# Patient Record
Sex: Female | Born: 1950 | Race: Black or African American | Hispanic: No | Smoking: Never smoker
Health system: Southern US, Community
[De-identification: ages and names within clinical notes are randomized; demographics above are authoritative.]

## PROBLEM LIST (undated history)

## (undated) DIAGNOSIS — M1990 Unspecified osteoarthritis, unspecified site: Secondary | ICD-10-CM

## (undated) DIAGNOSIS — R001 Bradycardia, unspecified: Secondary | ICD-10-CM

## (undated) DIAGNOSIS — Z8619 Personal history of other infectious and parasitic diseases: Secondary | ICD-10-CM

## (undated) DIAGNOSIS — N183 Chronic kidney disease, stage 3 unspecified: Secondary | ICD-10-CM

## (undated) DIAGNOSIS — N189 Chronic kidney disease, unspecified: Secondary | ICD-10-CM

## (undated) DIAGNOSIS — G4700 Insomnia, unspecified: Secondary | ICD-10-CM

## (undated) DIAGNOSIS — E785 Hyperlipidemia, unspecified: Secondary | ICD-10-CM

## (undated) DIAGNOSIS — M545 Low back pain, unspecified: Secondary | ICD-10-CM

## (undated) DIAGNOSIS — G8929 Other chronic pain: Secondary | ICD-10-CM

## (undated) DIAGNOSIS — Z9889 Other specified postprocedural states: Secondary | ICD-10-CM

## (undated) DIAGNOSIS — Z95810 Presence of automatic (implantable) cardiac defibrillator: Secondary | ICD-10-CM

## (undated) DIAGNOSIS — F329 Major depressive disorder, single episode, unspecified: Secondary | ICD-10-CM

## (undated) DIAGNOSIS — Z9981 Dependence on supplemental oxygen: Secondary | ICD-10-CM

## (undated) DIAGNOSIS — E1142 Type 2 diabetes mellitus with diabetic polyneuropathy: Secondary | ICD-10-CM

## (undated) DIAGNOSIS — J189 Pneumonia, unspecified organism: Secondary | ICD-10-CM

## (undated) DIAGNOSIS — T783XXA Angioneurotic edema, initial encounter: Secondary | ICD-10-CM

## (undated) DIAGNOSIS — E119 Type 2 diabetes mellitus without complications: Secondary | ICD-10-CM

## (undated) DIAGNOSIS — I10 Essential (primary) hypertension: Secondary | ICD-10-CM

## (undated) DIAGNOSIS — K219 Gastro-esophageal reflux disease without esophagitis: Secondary | ICD-10-CM

## (undated) DIAGNOSIS — R55 Syncope and collapse: Secondary | ICD-10-CM

## (undated) DIAGNOSIS — I255 Ischemic cardiomyopathy: Secondary | ICD-10-CM

## (undated) DIAGNOSIS — I2510 Atherosclerotic heart disease of native coronary artery without angina pectoris: Secondary | ICD-10-CM

## (undated) DIAGNOSIS — E039 Hypothyroidism, unspecified: Secondary | ICD-10-CM

## (undated) DIAGNOSIS — I38 Endocarditis, valve unspecified: Secondary | ICD-10-CM

## (undated) DIAGNOSIS — Z950 Presence of cardiac pacemaker: Secondary | ICD-10-CM

## (undated) DIAGNOSIS — E669 Obesity, unspecified: Secondary | ICD-10-CM

## (undated) DIAGNOSIS — IMO0002 Reserved for concepts with insufficient information to code with codable children: Secondary | ICD-10-CM

## (undated) DIAGNOSIS — G4733 Obstructive sleep apnea (adult) (pediatric): Secondary | ICD-10-CM

## (undated) DIAGNOSIS — M109 Gout, unspecified: Secondary | ICD-10-CM

## (undated) DIAGNOSIS — E559 Vitamin D deficiency, unspecified: Secondary | ICD-10-CM

## (undated) DIAGNOSIS — R0609 Other forms of dyspnea: Secondary | ICD-10-CM

## (undated) DIAGNOSIS — J45902 Unspecified asthma with status asthmaticus: Secondary | ICD-10-CM

## (undated) DIAGNOSIS — I509 Heart failure, unspecified: Secondary | ICD-10-CM

## (undated) DIAGNOSIS — Z9289 Personal history of other medical treatment: Secondary | ICD-10-CM

## (undated) DIAGNOSIS — I428 Other cardiomyopathies: Secondary | ICD-10-CM

## (undated) DIAGNOSIS — I447 Left bundle-branch block, unspecified: Secondary | ICD-10-CM

## (undated) DIAGNOSIS — I219 Acute myocardial infarction, unspecified: Secondary | ICD-10-CM

## (undated) DIAGNOSIS — R0600 Dyspnea, unspecified: Secondary | ICD-10-CM

## (undated) DIAGNOSIS — R42 Dizziness and giddiness: Secondary | ICD-10-CM

## (undated) DIAGNOSIS — Z9989 Dependence on other enabling machines and devices: Secondary | ICD-10-CM

## (undated) DIAGNOSIS — R609 Edema, unspecified: Secondary | ICD-10-CM

## (undated) DIAGNOSIS — D509 Iron deficiency anemia, unspecified: Secondary | ICD-10-CM

## (undated) DIAGNOSIS — R112 Nausea with vomiting, unspecified: Secondary | ICD-10-CM

## (undated) DIAGNOSIS — Z8489 Family history of other specified conditions: Secondary | ICD-10-CM

## (undated) DIAGNOSIS — F32A Depression, unspecified: Secondary | ICD-10-CM

## (undated) DIAGNOSIS — F419 Anxiety disorder, unspecified: Secondary | ICD-10-CM

## (undated) HISTORY — DX: Depression, unspecified: F32.A

## (undated) HISTORY — DX: Syncope and collapse: R55

## (undated) HISTORY — DX: Other cardiomyopathies: I42.8

## (undated) HISTORY — DX: Insomnia, unspecified: G47.00

## (undated) HISTORY — DX: Other chronic pain: G89.29

## (undated) HISTORY — PX: TRIGGER FINGER RELEASE: SHX641

## (undated) HISTORY — DX: Essential (primary) hypertension: I10

## (undated) HISTORY — DX: Dyspnea, unspecified: R06.00

## (undated) HISTORY — DX: Bradycardia, unspecified: R00.1

## (undated) HISTORY — PX: VESICOVAGINAL FISTULA CLOSURE W/ TAH: SUR271

## (undated) HISTORY — DX: Chronic kidney disease, unspecified: N18.9

## (undated) HISTORY — DX: Endocarditis, valve unspecified: I38

## (undated) HISTORY — DX: Edema, unspecified: R60.9

## (undated) HISTORY — PX: CATARACT EXTRACTION W/ INTRAOCULAR LENS  IMPLANT, BILATERAL: SHX1307

## (undated) HISTORY — DX: Dizziness and giddiness: R42

## (undated) HISTORY — PX: TUBAL LIGATION: SHX77

## (undated) HISTORY — DX: Major depressive disorder, single episode, unspecified: F32.9

## (undated) HISTORY — DX: Hypothyroidism, unspecified: E03.9

## (undated) HISTORY — DX: Reserved for concepts with insufficient information to code with codable children: IMO0002

## (undated) HISTORY — DX: Heart failure, unspecified: I50.9

## (undated) HISTORY — DX: Iron deficiency anemia, unspecified: D50.9

## (undated) HISTORY — DX: Angioneurotic edema, initial encounter: T78.3XXA

## (undated) HISTORY — DX: Personal history of other infectious and parasitic diseases: Z86.19

## (undated) HISTORY — DX: Other forms of dyspnea: R06.09

## (undated) HISTORY — DX: Unspecified osteoarthritis, unspecified site: M19.90

## (undated) HISTORY — DX: Left bundle-branch block, unspecified: I44.7

## (undated) HISTORY — DX: Gout, unspecified: M10.9

## (undated) HISTORY — DX: Hyperlipidemia, unspecified: E78.5

## (undated) HISTORY — DX: Vitamin D deficiency, unspecified: E55.9

## (undated) HISTORY — PX: DILATION AND CURETTAGE OF UTERUS: SHX78

## (undated) HISTORY — DX: Ischemic cardiomyopathy: I25.5

## (undated) HISTORY — DX: Type 2 diabetes mellitus without complications: E11.9

## (undated) HISTORY — DX: Obesity, unspecified: E66.9

## (undated) HISTORY — PX: COLONOSCOPY: SHX174

## (undated) HISTORY — DX: Unspecified asthma with status asthmaticus: J45.902

---

## 1988-07-25 HISTORY — PX: BLADDER SUSPENSION: SHX72

## 1988-07-25 HISTORY — PX: TOTAL ABDOMINAL HYSTERECTOMY: SHX209

## 2003-07-26 HISTORY — PX: CARDIAC CATHETERIZATION: SHX172

## 2003-07-26 HISTORY — PX: CARDIAC DEFIBRILLATOR PLACEMENT: SHX171

## 2006-07-25 HISTORY — PX: IMPLANTABLE CARDIOVERTER DEFIBRILLATOR GENERATOR CHANGE: SHX5859

## 2008-08-06 DIAGNOSIS — Z95810 Presence of automatic (implantable) cardiac defibrillator: Secondary | ICD-10-CM | POA: Insufficient documentation

## 2012-10-31 DIAGNOSIS — Z87898 Personal history of other specified conditions: Secondary | ICD-10-CM | POA: Insufficient documentation

## 2013-05-25 HISTORY — PX: CYST REMOVAL HAND: SHX6279

## 2013-08-15 DIAGNOSIS — R579 Shock, unspecified: Secondary | ICD-10-CM | POA: Insufficient documentation

## 2013-08-15 DIAGNOSIS — I2510 Atherosclerotic heart disease of native coronary artery without angina pectoris: Secondary | ICD-10-CM | POA: Insufficient documentation

## 2014-01-17 ENCOUNTER — Encounter: Payer: Self-pay | Admitting: Internal Medicine

## 2014-01-30 ENCOUNTER — Encounter: Payer: Self-pay | Admitting: Internal Medicine

## 2014-01-30 ENCOUNTER — Ambulatory Visit (INDEPENDENT_AMBULATORY_CARE_PROVIDER_SITE_OTHER): Payer: BC Managed Care – PPO | Admitting: Internal Medicine

## 2014-01-30 ENCOUNTER — Ambulatory Visit
Admission: RE | Admit: 2014-01-30 | Discharge: 2014-01-30 | Disposition: A | Discharge status: Home / Self Care | Payer: BC Managed Care – PPO | Source: Ambulatory Visit | Attending: Internal Medicine | Admitting: Internal Medicine

## 2014-01-30 VITALS — BP 134/77 | HR 83 | Ht 65.0 in | Wt 201.1 lb

## 2014-01-30 DIAGNOSIS — I428 Other cardiomyopathies: Secondary | ICD-10-CM

## 2014-01-30 DIAGNOSIS — N183 Chronic kidney disease, stage 3 unspecified: Secondary | ICD-10-CM

## 2014-01-30 DIAGNOSIS — I519 Heart disease, unspecified: Secondary | ICD-10-CM

## 2014-01-30 DIAGNOSIS — Z950 Presence of cardiac pacemaker: Secondary | ICD-10-CM

## 2014-01-30 DIAGNOSIS — I10 Essential (primary) hypertension: Secondary | ICD-10-CM

## 2014-01-30 DIAGNOSIS — I447 Left bundle-branch block, unspecified: Secondary | ICD-10-CM

## 2014-01-30 MED ORDER — SPIRONOLACTONE 25 MG PO TABS: 12.5000 mg | ORAL_TABLET | Freq: Every day | ORAL | Status: DC

## 2014-01-30 NOTE — Progress Notes (Signed)
Primary Care Physician: Teofilo Pod., PA-C Referring Physician:  Self referred Primary Cardiologist:  She hasn't seen anyone since 9/14   Crystal [name] is a 63 y.o. female with a h/o nonischemic CM, LBBB, chronic renal insufficiency, and diabetes who presents for Ep evaluation.  She reports that she was initially diagnosed with CHF in 2005.  She underwent cath at that time which revealed no significant CAD.  She says that she has not required coronary revascularization.  She was initiated on medical therapy but had persistently reduced EF (24% by notes).  She had a Guideant BiV ICD implanted in 2005 by Dr Modena Jansky.  LV lead could not be paced and she therefore had an epicardial LV lead placed 2 weeks later.  She had a generator change in 2008.  She has had progressive elevation in her RV lead threshold since that time.  Her EF improved to > 55% with BiV pacing.  She eventually reached ERI.  Given normalization of her EF and her RV threshold rise, it was decided to not replace the device and to follow with watchful waiting when she was seen in consultation by an EP physician on [facility]. She reports slowly progressing difficulty with SOB and abdominal fluid retention over the past few months. She has mild orthopnea and BLE edema.  She has fatigue which has been worse since starting verapamil (for blood pressure control). She developed acute renal failure and was hospitalized at [facility] several months ago.  She has made very slow progress since that time.  Most recently she was seen by Dr Neva Seat at [facility] 01/20/14.  Echo revealed EF 45-50%.  She was referred to EP at [facility] but decided to come see me today instead.  She states that she would prefer to have her care through our practice.  Today, she denies symptoms of palpitations, chest pain,  dizziness, presyncope, syncope, or neurologic sequela. The patient is tolerating medications without difficulties and is  otherwise without complaint today.   Past Medical History  Diagnosis Date  . Chronic renal insufficiency   . Anemia, iron deficiency   . Angioedema     felt to likely be due to ace inhibitors but says she has had this even off of medicines, appears to be tolerating ARBs chronically  . Nonischemic cardiomyopathy     s/p ICD in 2005. EF has since recovered  . CHF (congestive heart failure)   . Diabetes mellitus, type 2   . Gout   . Hyperlipidemia   . Obesity   . Systemic hypertension   . Unspecified hypothyroidism   . Intervertebral disc degeneration   . Back pain   . Hypertension   . Automatic implantable cardiac defibrillator in situ     high RV threshold chronically, device was turned off in 2014  . LBBB (left bundle branch block)    Past Surgical History  Procedure Laterality Date  . Vesicovaginal fistula closure w/ tah    . Cardiac catheterization  2005    no obstructive CAD per patient  . Cyst removal hand Right 05/2013    thumb  . Trigger finger release  05/213  . Cardiac defibrillator placement  2005    BiV ICD implanted,  LV lead is an epicardial lead  . Implantable cardioverter defibrillator generator change  2008  . Total abdominal hysterectomy      Current Outpatient Prescriptions  Medication Sig Dispense Refill  . ALPRAZolam (XANAX) 0.5 MG tablet Take 0.5  mg by mouth at bedtime.      Marland Kitchen aspirin 81 MG tablet Take 81 mg by mouth daily.      . carvedilol (COREG) 25 MG tablet Take 25 mg by mouth 2 (two) times daily with a meal.      . citalopram (CELEXA) 20 MG tablet Take 20 mg by mouth daily.      Marland Kitchen HYDROcodone-acetaminophen (NORCO/VICODIN) 5-325 MG per tablet Take 1 tablet by mouth every 6 (six) hours as needed for moderate pain.      . Insulin Glargine (TOUJEO SOLOSTAR [place]) Inject into the skin as directed.      . insulin glulisine (APIDRA) 100 UNIT/ML injection Inject into the skin as directed.      Marland Kitchen levothyroxine (SYNTHROID, LEVOTHROID) 88 MCG tablet Take 88  mcg by mouth daily before breakfast.      . linagliptin (TRADJENTA) 5 MG TABS tablet Take 5 mg by mouth daily.      Marland Kitchen loratadine (CLARITIN) 10 MG tablet Take 10 mg by mouth daily.      . metolazone (ZAROXOLYN) 2.5 MG tablet Take 2.5 mg by mouth as directed.      . montelukast (SINGULAIR) 10 MG tablet Take 10 mg by mouth at bedtime.      . temazepam (RESTORIL) 15 MG capsule Take 15 mg by mouth at bedtime.      Marland Kitchen ULORIC 40 MG tablet Take 1 tablet by mouth daily.      . valsartan (DIOVAN) 160 MG tablet Take 160 mg by mouth daily.      . Vitamin D, Ergocalciferol, (DRISDOL) 50000 UNITS CAPS capsule Take 50,000 Units by mouth every 30 (thirty) days.        No current facility-administered medications for this visit.    Allergies  Allergen Reactions  . Glucophage [Metformin Hcl] Other (See Comments)    Renal failure  . Ace Inhibitors Swelling and Other (See Comments)    Angioedema  . Advicor [Niacin-Lovastatin Er] Other (See Comments)    Muscle aches  . Bystolic [Nebivolol Hcl] Other (See Comments)    Edema  . Erythromycin Diarrhea and Nausea And Vomiting    *DERIVATIVES*  . Lipitor [Atorvastatin] Other (See Comments)    Muscle aches  . Lopid [Gemfibrozil] Other (See Comments)    Muscle aches  . Statins     Muscle aches    History   Social History  . Marital Status: Widowed    Spouse Name: N/A    Number of Children: N/A  . Years of Education: N/A   Occupational History  . Not on file.   Social History Main Topics  . Smoking status: Never Smoker   . Smokeless tobacco: Not on file  . Alcohol Use: No  . Drug Use: No  . Sexual Activity: Not on file   Other Topics Concern  . Not on file   Social History Narrative   Pt lives in [place] alone.  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    Family History  Problem Relation Age of Onset  . Arrhythmia Mother   . Diabetes Mellitus II Mother     Borderline DM  . Hypertension Mother     . Hypertension Father   . Heart disease Father   . Cancer Father     prostate  . Heart disease Other     (Maternal side) Ischemic heart disease  . Diabetes Mellitus II Other  ROS- All systems are reviewed and negative except as per the HPI above  Physical Exam: Filed Vitals:   01/30/14 0835  BP: 134/77  Pulse: 83  Height: 5\' 5"  (1.651 m)  Weight: 201 lb 1.9 oz (91.227 kg)    GEN- The patient is pleasant but overweight appearing, alert and oriented x 3 today.   Head- normocephalic, atraumatic Eyes-  Sclera clear, conjunctiva pink Ears- hearing intact Oropharynx- clear Neck- supple,  Lungs- Clear to ausculation bilaterally, normal work of breathing Heart- Regular rate and rhythm, no murmurs, rubs or gallops, PMI not laterally displaced GI- soft, NT, ND, + BS Extremities- no clubbing, cyanosis, +1 edema MS- no significant deformity or atrophy Skin- no rash or lesion Psych- euthymic mood, full affect Neuro- strength and sensation are intact  EKG today reveals sinus rhythm with LBBB (QRS 152 msec) CXR today is reviewed and reveals stable RA/RV leads as well as an epicardial LV lead Over 40 pages of records are reviewed today  Assessment and Plan:  1. Nonischemic CM, LBBB, NYHA Class II/III CHF Ms Eatmon presents today for third opinion regarding her CRT-D device which is at [facility].  She has responded well to CRT but subsequently had RV threshold rise with failure of her RV lead.  She initially opted to not replace the lead or consider generator change.  Her device has been programmed off for quite some time.  She did well initially with this, but appears to be having progressive decline.  Her EF has decreased upon most recent evaluation. I had a long discussion with the patient today about options.  I think that the best strategy is RV lead replacement and new generator placement.  Risks, benefits, and alternatives to this approach were discussed.  She is clear at this time  that she does not wish to undergo the procedure but would prefer medical management. I will therefore start spironolactone 12.5mg  daily today. I will also stop verapamil which seems to have caused worsening symptoms.  I will refer her to Dr Harl Bowie for aggressive medicine titration and follow-up.  We should repeat her echo in 3 months with optimization of her medicine.  IF she continues to have progressive decline in EF or fails to improve then she may be more willing to consider BiV system revision at that time.

## 2014-01-30 NOTE — Patient Instructions (Signed)
Your physician has recommended you make the following change in your medication:  1. Stop verapamil 2. Start spironolactone 12.5 mg once daily (1/2 tablet)  A chest x-ray takes a picture of the organs and structures inside the chest, including the heart, lungs, and blood vessels. This test can show several things, including, whether the heart is enlarges; whether fluid is building up in the lungs; and whether pacemaker / defibrillator leads are still in place.  Your physician has requested that you have an echocardiogram. Echocardiography is a painless test that uses sound waves to create images of your heart. It provides your doctor with information about the size and shape of your heart and how well your heart's chambers and valves are working. This procedure takes approximately one hour. There are no restrictions for this procedure. PLEASE HAVE YOUR ECHOCARDIOGRAM IN 3 MONTHS.  Your physician wants you to follow-up in: 3 months with Dr. Rayann Heman. You will receive a reminder letter in the mail two months in advance. If you don't receive a letter, please call our office to schedule the follow-up appointment.  Make an appointment to establish care with Dr. Harl Bowie (cardiology) in [place], [place]

## 2014-02-02 DIAGNOSIS — I519 Heart disease, unspecified: Secondary | ICD-10-CM | POA: Insufficient documentation

## 2014-02-02 DIAGNOSIS — I10 Essential (primary) hypertension: Secondary | ICD-10-CM | POA: Insufficient documentation

## 2014-02-02 DIAGNOSIS — N189 Chronic kidney disease, unspecified: Secondary | ICD-10-CM | POA: Insufficient documentation

## 2014-02-02 DIAGNOSIS — I428 Other cardiomyopathies: Secondary | ICD-10-CM | POA: Insufficient documentation

## 2014-02-02 DIAGNOSIS — I447 Left bundle-branch block, unspecified: Secondary | ICD-10-CM | POA: Insufficient documentation

## 2014-02-05 ENCOUNTER — Institutional Professional Consult (permissible substitution): Payer: Self-pay | Admitting: Internal Medicine

## 2014-02-10 ENCOUNTER — Ambulatory Visit (INDEPENDENT_AMBULATORY_CARE_PROVIDER_SITE_OTHER): Payer: BC Managed Care – PPO | Admitting: Cardiology

## 2014-02-10 ENCOUNTER — Encounter: Payer: Self-pay | Admitting: Cardiology

## 2014-02-10 VITALS — BP 107/68 | HR 83 | Ht 65.0 in | Wt 196.0 lb

## 2014-02-10 DIAGNOSIS — N189 Chronic kidney disease, unspecified: Secondary | ICD-10-CM

## 2014-02-10 DIAGNOSIS — I5022 Chronic systolic (congestive) heart failure: Secondary | ICD-10-CM

## 2014-02-10 NOTE — Progress Notes (Signed)
Clinical Summary Crystal [name] is a 63 y.o.female last seen by Dr Rayann Heman, this is our first visit together. She has a long history of cardiomyopathy previously followed at [facility]. She recently saw Dr Rayann Heman as new EP patient, and is set up with [place] today for assistance in management with her chronic systolic heart failure   1. Chronic systolic heart failure - long hx of NICM dating back to 2005, with reported LVEF at that time 24%/  - cath at that time showed no significant CAD.  - She has a chronic LBBB. Had a BiV AICD placed previously with epicardial LV lead with subsequent normalization of LVEF - LVEF improved with BiV pacing to >55%. - from notes device reached ERI, her RV lead has increasing thresholds around that time. It was decided not to replace the device  since normal LVEF and follow her clinically.  - recently seen at [facility] with repeat echo 12/2013 LVEF 45-50%  - questionable hx of angioedema on ACE-Is, she states has been on ARBs and has tolerated.  - last visit started on aldactone 12.5mg  daily, verapamil was stopped. She has not had repeat labs done yet.   - reports significant weight gain since May, approx 20 lbs. Increased SOB, DOE at that time, but recently has been improving.  DOE at 1-3 blocks, which is somewhat better from the last few months - no orthopnea, no PND, no LE edema though fluid usually builds in abdomen. Home weights stable around 200 lbs.  - limiting salt intake, avoiding NSAIDs.  -  On lasix 20mg  daily, metolazone 2.5 once weekly.       Past Medical History  Diagnosis Date  . Chronic renal insufficiency   . Anemia, iron deficiency   . Angioedema     felt to likely be due to ace inhibitors but says she has had this even off of medicines, appears to be tolerating ARBs chronically  . Nonischemic cardiomyopathy     s/p ICD in 2005. EF has since recovered  . CHF (congestive heart failure)   . Diabetes mellitus, type 2   . Gout   .  Hyperlipidemia   . Obesity   . Systemic hypertension   . Unspecified hypothyroidism   . Intervertebral disc degeneration   . Back pain   . Hypertension   . Automatic implantable cardiac defibrillator in situ     high RV threshold chronically, device was turned off in 2014  . LBBB (left bundle [place] block)      Allergies  Allergen Reactions  . Glucophage [Metformin Hcl] Other (See Comments)    Renal failure  . Ace Inhibitors Swelling and Other (See Comments)    Angioedema  . Advicor [Niacin-Lovastatin Er] Other (See Comments)    Muscle aches  . Bystolic [Nebivolol Hcl] Other (See Comments)    Edema  . Erythromycin Diarrhea and Nausea And Vomiting    *DERIVATIVES*  . Lipitor [Atorvastatin] Other (See Comments)    Muscle aches  . Lopid [Gemfibrozil] Other (See Comments)    Muscle aches  . Statins     Muscle aches     Current Outpatient Prescriptions  Medication Sig Dispense Refill  . ALPRAZolam (XANAX) 0.5 MG tablet Take 0.5 mg by mouth at bedtime.      Marland Kitchen aspirin 81 MG tablet Take 81 mg by mouth daily.      . carvedilol (COREG) 25 MG tablet Take 25 mg by mouth 2 (two) times daily with a  meal.      . citalopram (CELEXA) 20 MG tablet Take 20 mg by mouth daily.      . furosemide (LASIX) 20 MG tablet Take 20 mg by mouth daily.      Marland Kitchen HYDROcodone-acetaminophen (NORCO/VICODIN) 5-325 MG per tablet Take 1 tablet by mouth every 6 (six) hours as needed for moderate pain.      . Insulin Glargine (TOUJEO SOLOSTAR [place]) Inject into the skin as directed.      . insulin glulisine (APIDRA) 100 UNIT/ML injection Inject into the skin as directed.      Marland Kitchen levothyroxine (SYNTHROID, LEVOTHROID) 88 MCG tablet Take 88 mcg by mouth daily before breakfast.      . linagliptin (TRADJENTA) 5 MG TABS tablet Take 5 mg by mouth daily.      Marland Kitchen loratadine (CLARITIN) 10 MG tablet Take 10 mg by mouth daily.      . metolazone (ZAROXOLYN) 2.5 MG tablet Take 2.5 mg by mouth as directed.      . montelukast  (SINGULAIR) 10 MG tablet Take 10 mg by mouth at bedtime.      Marland Kitchen spironolactone (ALDACTONE) 25 MG tablet Take 0.5 tablets (12.5 mg total) by mouth daily.  90 tablet  3  . temazepam (RESTORIL) 15 MG capsule Take 15 mg by mouth at bedtime.      Marland Kitchen ULORIC 40 MG tablet Take 1 tablet by mouth daily.      . valsartan (DIOVAN) 160 MG tablet Take 160 mg by mouth daily.      . Vitamin D, Ergocalciferol, (DRISDOL) 50000 UNITS CAPS capsule Take 50,000 Units by mouth every 30 (thirty) days.        No current facility-administered medications for this visit.     Past Surgical History  Procedure Laterality Date  . Vesicovaginal fistula closure w/ tah    . Cardiac catheterization  2005    no obstructive CAD per patient  . Cyst removal hand Right 05/2013    thumb  . Trigger finger release  05/213  . Cardiac defibrillator placement  2005    BiV ICD implanted,  LV lead is an epicardial lead  . Implantable cardioverter defibrillator generator change  2008  . Total abdominal hysterectomy       Allergies  Allergen Reactions  . Glucophage [Metformin Hcl] Other (See Comments)    Renal failure  . Ace Inhibitors Swelling and Other (See Comments)    Angioedema  . Advicor [Niacin-Lovastatin Er] Other (See Comments)    Muscle aches  . Bystolic [Nebivolol Hcl] Other (See Comments)    Edema  . Erythromycin Diarrhea and Nausea And Vomiting    *DERIVATIVES*  . Lipitor [Atorvastatin] Other (See Comments)    Muscle aches  . Lopid [Gemfibrozil] Other (See Comments)    Muscle aches  . Statins     Muscle aches      Family History  Problem Relation Age of Onset  . Arrhythmia Mother   . Diabetes Mellitus II Mother     Borderline DM  . Hypertension Mother   . Hypertension Father   . Heart disease Father   . Cancer Father     prostate  . Heart disease Other     (Maternal side) Ischemic heart disease  . Diabetes Mellitus II Other      Social History Ms. Falla reports that she has never smoked.  She does not have any smokeless tobacco history on file. Ms. Blot reports that she does not drink alcohol.  Review of Systems CONSTITUTIONAL: No weight loss, fever, chills, weakness or fatigue.  HEENT: Eyes: No visual loss, blurred vision, double vision or yellow sclerae.No hearing loss, sneezing, congestion, runny nose or sore throat.  SKIN: No rash or itching.  CARDIOVASCULAR: per HPI RESPIRATORY: No cough or sputum.  GASTROINTESTINAL: No anorexia, nausea, vomiting or diarrhea. No abdominal pain or blood.  GENITOURINARY: No burning on urination, no polyuria NEUROLOGICAL: No headache, dizziness, syncope, paralysis, ataxia, numbness or tingling in the extremities. No change in bowel or bladder control.  MUSCULOSKELETAL: No muscle, back pain, joint pain or stiffness.  LYMPHATICS: No enlarged nodes. No history of splenectomy.  PSYCHIATRIC: No history of depression or anxiety.  ENDOCRINOLOGIC: No reports of sweating, cold or heat intolerance. No polyuria or polydipsia.  Marland Kitchen   Physical Examination p 83 bp 107/68 Wt 196 lbs BMI 33 Gen: resting comfortably, no acute distress HEENT: no scleral icterus, pupils equal round and reactive, no palptable cervical adenopathy,  CV: RRR, no m/r/g,no JVD, no carotid bruits Resp: Clear to auscultation bilaterally GI: abdomen is soft, non-tender, non-distended, normal bowel sounds, no hepatosplenomegaly MSK: extremities are warm, no edema.  Skin: warm, no rash Neuro:  no focal deficits Psych: appropriate affect   Diagnostic Studies 09/2012 Echo Carrilion LVEF 0000000, grade I diastolic dysfunction, mild MR    Assessment and Plan   1. NICM - long history as stated above, previously severe LV systolic dysfunction that normalized with medical therapy and BiV ICD. BiV no longer active as reached ERI, was not replaced due to normalized LV function by prior cardiologist - worsening CHF symptoms starting in May, that are somewhat improved over the  last few weeks with decreasing weight.  - plan in cooperation with EP is to optimize medical therapy over the next 3 months, follow symptoms and repeat echo to decide if neccesary to replace BiV AICD. - started on aldactone 01/30/14 by Dr Rayann Heman, she will have BMET to follow up K and Cr - do not see echo report from [facility] from a few months ago, will request records.   F/u 2 months   Arnoldo Lenis, M.D., F.A.C.C.

## 2014-02-10 NOTE — Patient Instructions (Signed)
   Lab for [employer] will contact with results via phone or letter.   Continue all current medications. Follow up in  2 months

## 2014-03-25 DIAGNOSIS — Z8619 Personal history of other infectious and parasitic diseases: Secondary | ICD-10-CM

## 2014-03-25 HISTORY — DX: Personal history of other infectious and parasitic diseases: Z86.19

## 2014-04-07 ENCOUNTER — Encounter: Payer: Self-pay | Admitting: Cardiology

## 2014-04-07 ENCOUNTER — Ambulatory Visit (INDEPENDENT_AMBULATORY_CARE_PROVIDER_SITE_OTHER): Payer: BC Managed Care – PPO | Admitting: Cardiology

## 2014-04-07 VITALS — BP 122/76 | HR 75 | Ht 65.0 in | Wt 198.0 lb

## 2014-04-07 DIAGNOSIS — I5022 Chronic systolic (congestive) heart failure: Secondary | ICD-10-CM

## 2014-04-07 NOTE — Progress Notes (Addendum)
Clinical Summary Ms. Reznicek is a 63 y.o.female seen today for follow up of the following medical problems.   1. Chronic systolic heart failure  - long hx of NICM dating back to 2005, with reported LVEF at that time 24%/  - cath at that time showed no significant CAD.  - She has a chronic LBBB. Had a BiV AICD placed previously with epicardial LV lead with subsequent normalization of LVEF  - LVEF improved with BiV pacing to >55%.  - from notes device reached ERI, her RV lead has increasing thresholds around that time. It was decided not to replace the device since normal LVEF and follow her clinically.  - recently seen at [facility] with repeat echo 12/2013 LVEF 45-50%  - questionable hx of angioedema on ACE-Is, she states has been on ARBs and has tolerated.   - no orthopnea, no PND, no LE edema  - limiting salt intake, avoiding NSAIDs.  - On lasix 20mg  daily, metolazone 2.5 once weekly.   Past Medical History  Diagnosis Date  . Chronic renal insufficiency   . Anemia, iron deficiency   . Angioedema     felt to likely be due to ace inhibitors but says she has had this even off of medicines, appears to be tolerating ARBs chronically  . Nonischemic cardiomyopathy     s/p ICD in 2005. EF has since recovered  . CHF (congestive heart failure)   . Diabetes mellitus, type 2   . Gout   . Hyperlipidemia   . Obesity   . Systemic hypertension   . Unspecified hypothyroidism   . Intervertebral disc degeneration   . Back pain   . Hypertension   . Automatic implantable cardiac defibrillator in situ     high RV threshold chronically, device was turned off in 2014  . LBBB (left bundle [place] block)      Allergies  Allergen Reactions  . Glucophage [Metformin Hcl] Other (See Comments)    Renal failure  . Ace Inhibitors Swelling and Other (See Comments)    Angioedema  . Advicor [Niacin-Lovastatin Er] Other (See Comments)    Muscle aches  . Bystolic [Nebivolol Hcl] Other (See  Comments)    Edema  . Erythromycin Diarrhea and Nausea And Vomiting    *DERIVATIVES*  . Lipitor [Atorvastatin] Other (See Comments)    Muscle aches  . Lopid [Gemfibrozil] Other (See Comments)    Muscle aches  . Statins     Muscle aches     Current Outpatient Prescriptions  Medication Sig Dispense Refill  . ALPRAZolam (XANAX) 0.5 MG tablet Take 0.5 mg by mouth at bedtime.      Marland Kitchen aspirin 81 MG tablet Take 81 mg by mouth daily.      . carvedilol (COREG) 25 MG tablet Take 25 mg by mouth 2 (two) times daily with a meal.      . citalopram (CELEXA) 20 MG tablet Take 20 mg by mouth daily.      . furosemide (LASIX) 20 MG tablet Take 20 mg by mouth daily.      Marland Kitchen HYDROcodone-acetaminophen (NORCO/VICODIN) 5-325 MG per tablet Take 1 tablet by mouth every 6 (six) hours as needed for moderate pain.      . Insulin Glargine (TOUJEO SOLOSTAR [place]) Inject into the skin as directed.      . insulin glulisine (APIDRA) 100 UNIT/ML injection Inject into the skin as directed.      Marland Kitchen levothyroxine (SYNTHROID, LEVOTHROID) 88 MCG tablet Take  88 mcg by mouth daily before breakfast.      . linagliptin (TRADJENTA) 5 MG TABS tablet Take 5 mg by mouth daily.      Marland Kitchen loratadine (CLARITIN) 10 MG tablet Take 10 mg by mouth daily.      . metolazone (ZAROXOLYN) 2.5 MG tablet Take 2.5 mg by mouth as directed.      . montelukast (SINGULAIR) 10 MG tablet Take 10 mg by mouth at bedtime.      Marland Kitchen spironolactone (ALDACTONE) 25 MG tablet Take 0.5 tablets (12.5 mg total) by mouth daily.  90 tablet  3  . temazepam (RESTORIL) 15 MG capsule Take 15 mg by mouth at bedtime.      Marland Kitchen ULORIC 40 MG tablet Take 1 tablet by mouth daily.      . valsartan (DIOVAN) 160 MG tablet Take 160 mg by mouth daily.      . Vitamin D, Ergocalciferol, (DRISDOL) 50000 UNITS CAPS capsule Take 50,000 Units by mouth every 30 (thirty) days.        No current facility-administered medications for this visit.     Past Surgical History  Procedure Laterality Date   . Vesicovaginal fistula closure w/ tah    . Cardiac catheterization  2005    no obstructive CAD per patient  . Cyst removal hand Right 05/2013    thumb  . Trigger finger release  05/213  . Cardiac defibrillator placement  2005    BiV ICD implanted,  LV lead is an epicardial lead  . Implantable cardioverter defibrillator generator change  2008  . Total abdominal hysterectomy       Allergies  Allergen Reactions  . Glucophage [Metformin Hcl] Other (See Comments)    Renal failure  . Ace Inhibitors Swelling and Other (See Comments)    Angioedema  . Advicor [Niacin-Lovastatin Er] Other (See Comments)    Muscle aches  . Bystolic [Nebivolol Hcl] Other (See Comments)    Edema  . Erythromycin Diarrhea and Nausea And Vomiting    *DERIVATIVES*  . Lipitor [Atorvastatin] Other (See Comments)    Muscle aches  . Lopid [Gemfibrozil] Other (See Comments)    Muscle aches  . Statins     Muscle aches      Family History  Problem Relation Age of Onset  . Arrhythmia Mother   . Diabetes Mellitus II Mother     Borderline DM  . Hypertension Mother   . Hypertension Father   . Heart disease Father   . Cancer Father     prostate  . Heart disease Other     (Maternal side) Ischemic heart disease  . Diabetes Mellitus II Other      Social History Ms. Rayborn reports that she has never smoked. She does not have any smokeless tobacco history on file. Ms. Freda reports that she does not drink alcohol.   Review of Systems CONSTITUTIONAL: No weight loss, fever, chills, weakness or fatigue.  HEENT: Eyes: No visual loss, blurred vision, double vision or yellow sclerae.No hearing loss, sneezing, congestion, runny nose or sore throat.  SKIN: No rash or itching.  CARDIOVASCULAR: per HPI RESPIRATORY: No shortness of breath, cough or sputum.  GASTROINTESTINAL: No anorexia, nausea, vomiting or diarrhea. No abdominal pain or blood.  GENITOURINARY: No burning on urination, no  polyuria NEUROLOGICAL: No headache, dizziness, syncope, paralysis, ataxia, numbness or tingling in the extremities. No change in bowel or bladder control.  MUSCULOSKELETAL: No muscle, back pain, joint pain or stiffness.  LYMPHATICS: No enlarged nodes.  No history of splenectomy.  PSYCHIATRIC: No history of depression or anxiety.  ENDOCRINOLOGIC: No reports of sweating, cold or heat intolerance. No polyuria or polydipsia.  Marland Kitchen   Physical Examination p 75 bp 122/76 Wt 198 lbs BMI33 Gen: resting comfortably, no acute distress HEENT: no scleral icterus, pupils equal round and reactive, no palptable cervical adenopathy,  CV: RRR, no m/r/g, no JVD, no carotid bruits Resp: Clear to auscultation bilaterally GI: abdomen is soft, non-tender, non-distended, normal bowel sounds, no hepatosplenomegaly MSK: extremities are warm, no edema.  Skin: warm, no rash Neuro:  no focal deficits Psych: appropriate affect   Diagnostic Studies 09/2012 Echo Carrilion  LVEF 0000000, grade I diastolic dysfunction, mild MR     Assessment and Plan  1. NICM  - long history as stated above, previously severe LV systolic dysfunction that normalized with medical therapy and BiV ICD. BiV no longer active as reached ERI, was not replaced due to normalized LV function by prior cardiologist  - plan in cooperation with EP is to optimize medical therapy over the next 3 months, follow symptoms and repeat echo to decide if neccesary to replace BiV AICD. A repeat echo and EP f/u is planned in October.  - will request lab results from her family care physician, f/u K and Cr after starting aldactone.     F/u 6 months    Arnoldo Lenis, M.D., F.A.C.C.

## 2014-04-07 NOTE — Patient Instructions (Signed)
Continue all current medications. Your physician wants you to follow up in: 6 months.  You will receive a reminder letter in the mail one-two months in advance.  If you don't receive a letter, please call our office to schedule the follow up appointment   

## 2014-05-01 ENCOUNTER — Other Ambulatory Visit (HOSPITAL_COMMUNITY): Payer: Self-pay | Admitting: Cardiology

## 2014-05-01 DIAGNOSIS — I5022 Chronic systolic (congestive) heart failure: Secondary | ICD-10-CM

## 2014-05-05 ENCOUNTER — Encounter: Payer: Self-pay | Admitting: Internal Medicine

## 2014-05-05 ENCOUNTER — Ambulatory Visit (HOSPITAL_COMMUNITY): Payer: BC Managed Care – PPO | Attending: Cardiology | Admitting: Radiology

## 2014-05-05 ENCOUNTER — Ambulatory Visit (INDEPENDENT_AMBULATORY_CARE_PROVIDER_SITE_OTHER): Payer: BC Managed Care – PPO | Admitting: Internal Medicine

## 2014-05-05 VITALS — BP 122/74 | HR 81 | Ht 65.0 in | Wt 200.0 lb

## 2014-05-05 DIAGNOSIS — E785 Hyperlipidemia, unspecified: Secondary | ICD-10-CM | POA: Insufficient documentation

## 2014-05-05 DIAGNOSIS — I447 Left bundle-branch block, unspecified: Secondary | ICD-10-CM | POA: Insufficient documentation

## 2014-05-05 DIAGNOSIS — I519 Heart disease, unspecified: Secondary | ICD-10-CM

## 2014-05-05 DIAGNOSIS — E119 Type 2 diabetes mellitus without complications: Secondary | ICD-10-CM | POA: Insufficient documentation

## 2014-05-05 DIAGNOSIS — I429 Cardiomyopathy, unspecified: Secondary | ICD-10-CM | POA: Diagnosis present

## 2014-05-05 DIAGNOSIS — I10 Essential (primary) hypertension: Secondary | ICD-10-CM | POA: Diagnosis not present

## 2014-05-05 DIAGNOSIS — D509 Iron deficiency anemia, unspecified: Secondary | ICD-10-CM | POA: Insufficient documentation

## 2014-05-05 DIAGNOSIS — I5022 Chronic systolic (congestive) heart failure: Secondary | ICD-10-CM

## 2014-05-05 DIAGNOSIS — I219 Acute myocardial infarction, unspecified: Secondary | ICD-10-CM | POA: Insufficient documentation

## 2014-05-05 DIAGNOSIS — E039 Hypothyroidism, unspecified: Secondary | ICD-10-CM | POA: Insufficient documentation

## 2014-05-05 DIAGNOSIS — I428 Other cardiomyopathies: Secondary | ICD-10-CM

## 2014-05-05 DIAGNOSIS — J45909 Unspecified asthma, uncomplicated: Secondary | ICD-10-CM | POA: Insufficient documentation

## 2014-05-05 DIAGNOSIS — E669 Obesity, unspecified: Secondary | ICD-10-CM | POA: Diagnosis not present

## 2014-05-05 DIAGNOSIS — E079 Disorder of thyroid, unspecified: Secondary | ICD-10-CM | POA: Insufficient documentation

## 2014-05-05 DIAGNOSIS — J4540 Moderate persistent asthma, uncomplicated: Secondary | ICD-10-CM | POA: Insufficient documentation

## 2014-05-05 NOTE — Patient Instructions (Signed)
KEEP YOUR APPT WITH DR. BRANCH AS PLANNED AND JUST FOLLOW UP WITH DR. ALLRED AS NEEDED  Your physician recommends that you continue on your current medications as directed. Please refer to the Current Medication list given to you today.

## 2014-05-05 NOTE — Progress Notes (Signed)
PCP:  Teofilo Pod., PA-C Primary Cardiologist:  Dr Harl Bowie  The patient presents today for routine electrophysiology followup.  Since last being seen in our clinic, the patient reports doing very well.  She remains active. Today, she denies symptoms of palpitations, chest pain, shortness of breath, orthopnea, PND, lower extremity edema, dizziness, presyncope, syncope, or neurologic sequela.  The patient feels that she is tolerating medications without difficulties and is otherwise without complaint today.   Past Medical History  Diagnosis Date  . Chronic renal insufficiency   . Anemia, iron deficiency   . Angioedema     felt to likely be due to ace inhibitors but says she has had this even off of medicines, appears to be tolerating ARBs chronically  . Nonischemic cardiomyopathy     s/p ICD in 2005. EF has since recovered  . CHF (congestive heart failure)   . Diabetes mellitus, type 2   . Gout   . Hyperlipidemia   . Obesity   . Systemic hypertension   . Unspecified hypothyroidism   . Intervertebral disc degeneration   . Back pain   . Hypertension   . Automatic implantable cardiac defibrillator in situ     high RV threshold chronically, device was turned off in 2014  . LBBB (left bundle branch block)    Past Surgical History  Procedure Laterality Date  . Vesicovaginal fistula closure w/ tah    . Cardiac catheterization  2005    no obstructive CAD per patient  . Cyst removal hand Right 05/2013    thumb  . Trigger finger release  05/213  . Cardiac defibrillator placement  2005    BiV ICD implanted,  LV lead is an epicardial lead  . Implantable cardioverter defibrillator generator change  2008  . Total abdominal hysterectomy      Current Outpatient Prescriptions  Medication Sig Dispense Refill  . ALPRAZolam (XANAX) 0.5 MG tablet Take 0.5 mg by mouth at bedtime.      Marland Kitchen aspirin 81 MG tablet Take 81 mg by mouth daily.      . carvedilol (COREG) 25 MG tablet Take 25 mg  by mouth 2 (two) times daily with a meal.      . citalopram (CELEXA) 20 MG tablet Take 20 mg by mouth daily.      . furosemide (LASIX) 20 MG tablet Take 20 mg by mouth daily.      Marland Kitchen HYDROcodone-acetaminophen (NORCO/VICODIN) 5-325 MG per tablet Take 1 tablet by mouth every 6 (six) hours as needed for moderate pain.      Marland Kitchen insulin glargine (LANTUS) 100 UNIT/ML injection Inject 100 Units into the skin daily.      . Insulin Glargine (TOUJEO SOLOSTAR [place]) Inject into the skin as directed.      . insulin glulisine (APIDRA) 100 UNIT/ML injection Inject into the skin as directed.      Marland Kitchen levothyroxine (SYNTHROID, LEVOTHROID) 75 MCG tablet Take 75 mcg by mouth daily before breakfast.      . linagliptin (TRADJENTA) 5 MG TABS tablet Take 5 mg by mouth daily.      Marland Kitchen LINZESS 145 MCG CAPS capsule Take 1 capsule by mouth daily.      Marland Kitchen loratadine (CLARITIN) 10 MG tablet Take 10 mg by mouth daily.      . metolazone (ZAROXOLYN) 2.5 MG tablet Take 2.5 mg by mouth as directed.      . montelukast (SINGULAIR) 10 MG tablet Take 10 mg by mouth at bedtime.      Marland Kitchen  ONE TOUCH ULTRA TEST test strip As directed      . spironolactone (ALDACTONE) 25 MG tablet Take 0.5 tablets (12.5 mg total) by mouth daily.  90 tablet  3  . temazepam (RESTORIL) 15 MG capsule Take 15 mg by mouth at bedtime.      Marland Kitchen ULORIC 40 MG tablet Take 1 tablet by mouth daily.      . valsartan (DIOVAN) 160 MG tablet Take 160 mg by mouth daily.      . Vitamin D, Ergocalciferol, (DRISDOL) 50000 UNITS CAPS capsule Take 50,000 Units by mouth every 30 (thirty) days.        No current facility-administered medications for this visit.    Allergies  Allergen Reactions  . Glucophage [Metformin Hcl] Other (See Comments)    Renal failure  . Ace Inhibitors Swelling and Other (See Comments)    Angioedema  . Advicor [Niacin-Lovastatin Er] Other (See Comments)    Muscle aches  . Bystolic [Nebivolol Hcl] Other (See Comments)    Edema  . Erythromycin Diarrhea and  Nausea And Vomiting    *DERIVATIVES*  . Lipitor [Atorvastatin] Other (See Comments)    Muscle aches  . Lopid [Gemfibrozil] Other (See Comments)    Muscle aches  . Statins     Muscle aches    History   Social History  . Marital Status: Widowed    Spouse Name: N/A    Number of Children: N/A  . Years of Education: N/A   Occupational History  . Not on file.   Social History Main Topics  . Smoking status: Never Smoker   . Smokeless tobacco: Not on file  . Alcohol Use: No  . Drug Use: No  . Sexual Activity: Not on file   Other Topics Concern  . Not on file   Social History Narrative   Pt lives in [place] alone.  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    Family History  Problem Relation Age of Onset  . Arrhythmia Mother   . Diabetes Mellitus II Mother     Borderline DM  . Hypertension Mother   . Hypertension Father   . Heart disease Father   . Cancer Father     prostate  . Heart disease Other     (Maternal side) Ischemic heart disease  . Diabetes Mellitus II Other     ROS-  All systems are reviewed and are negative except as outlined in the HPI above  Physical Exam: Filed Vitals:   05/05/14 1051  BP: 122/74  Pulse: 81  Height: 5\' 5"  (1.651 m)  Weight: 200 lb (90.719 kg)    GEN- The patient is well appearing, alert and oriented x 3 today.   Head- normocephalic, atraumatic Eyes-  Sclera clear, conjunctiva pink Ears- hearing intact Oropharynx- clear Neck- supple, no JVP Lymph- no cervical lymphadenopathy Lungs- Clear to ausculation bilaterally, normal work of breathing Heart- Regular rate and rhythm, no murmurs, rubs or gallops, PMI not laterally displaced GI- soft, NT, ND, + BS Extremities- no clubbing, cyanosis, or edema Neuro- strength and sensation are intact  ekg today reveals sinus rhythm 81 bpm, PR 182, QRS 114, Qtc 457, nonspecific ST/ T changes  Assessment and Plan:  1. Nonischemic CM, LBBB, NYHA  Class II CHF  Echo reday reveals that her EF has improved.  QRS is < 120 msec.  She would like to avoid device replacement at this time. I think that this  is quite reasonable. She will continue medical management with Dr Harl Bowie and I will see her as needed going forward. Should she has decompensation of her CHF or symptoms of arrhythmia then I am happy to see her again at that time.

## 2014-05-05 NOTE — Progress Notes (Signed)
Echocardiogram performed.  

## 2014-07-25 HISTORY — PX: CARPAL TUNNEL RELEASE: SHX101

## 2014-11-03 ENCOUNTER — Encounter: Payer: Self-pay | Admitting: *Deleted

## 2014-11-03 ENCOUNTER — Ambulatory Visit (INDEPENDENT_AMBULATORY_CARE_PROVIDER_SITE_OTHER): Payer: BLUE CROSS/BLUE SHIELD | Admitting: Cardiology

## 2014-11-03 ENCOUNTER — Encounter: Payer: Self-pay | Admitting: Cardiology

## 2014-11-03 VITALS — BP 112/69 | HR 76 | Ht 65.0 in | Wt 200.0 lb

## 2014-11-03 DIAGNOSIS — E785 Hyperlipidemia, unspecified: Secondary | ICD-10-CM

## 2014-11-03 DIAGNOSIS — I429 Cardiomyopathy, unspecified: Secondary | ICD-10-CM | POA: Diagnosis not present

## 2014-11-03 DIAGNOSIS — I10 Essential (primary) hypertension: Secondary | ICD-10-CM

## 2014-11-03 DIAGNOSIS — I428 Other cardiomyopathies: Secondary | ICD-10-CM

## 2014-11-03 NOTE — Progress Notes (Signed)
Clinical Summary Ms. Kinman is a 64 y.o.female seen today for follow up of the following medical problems.   1. Chronic systolic heart failure  - long hx of NICM dating back to 2005, with reported LVEF at that time 24% - cath at that time showed no significant CAD.  - She has a chronic LBBB. Had a BiV AICD placed previously with epicardial LV lead with subsequent normalization of LVEF  - LVEF improved with BiV pacing to >55%.  - from notes device reached ERI, her RV lead has increasing thresholds around that time. It was decided not to replace the device since normal LVEF and follow her clinically.   - repeat echo 04/2014 LVEF 50-55% - questionable hx of angioedema on ACE-Is, she states has been on ARBs and has tolerated.    - no orthopnea, no PND, no LE edema  - limiting salt intake, avoiding NSAIDs.     2. CKD - stable Cr around 1.5 - she is followed by Dr Nilda Calamity in [place], [place]  3. Hyperlipidemia -  Compliant with crestor  4. DM2 - followed by pcp  5. HTN - checks at work often. Typically 110s/60s - compliant with meds   Past Medical History  Diagnosis Date  . Chronic renal insufficiency   . Anemia, iron deficiency   . Angioedema     felt to likely be due to ace inhibitors but says she has had this even off of medicines, appears to be tolerating ARBs chronically  . Nonischemic cardiomyopathy     s/p ICD in 2005. EF has since recovered  . CHF (congestive heart failure)   . Diabetes mellitus, type 2   . Gout   . Hyperlipidemia   . Obesity   . Systemic hypertension   . Unspecified hypothyroidism   . Intervertebral disc degeneration   . Back pain   . Hypertension   . Automatic implantable cardiac defibrillator in situ     high RV threshold chronically, device was turned off in 2014  . LBBB (left bundle [place] block)      Allergies  Allergen Reactions  . Glucophage [Metformin Hcl] Other (See Comments)    Renal failure  . Ace Inhibitors  Swelling and Other (See Comments)    Angioedema  . Advicor [Niacin-Lovastatin Er] Other (See Comments)    Muscle aches  . Bystolic [Nebivolol Hcl] Other (See Comments)    Edema  . Erythromycin Diarrhea and Nausea And Vomiting    *DERIVATIVES*  . Lipitor [Atorvastatin] Other (See Comments)    Muscle aches  . Lopid [Gemfibrozil] Other (See Comments)    Muscle aches  . Statins     Muscle aches     Current Outpatient Prescriptions  Medication Sig Dispense Refill  . ALPRAZolam (XANAX) 0.5 MG tablet Take 0.5 mg by mouth at bedtime.    Marland Kitchen aspirin 81 MG tablet Take 81 mg by mouth daily.    . carvedilol (COREG) 25 MG tablet Take 25 mg by mouth 2 (two) times daily with a meal.    . citalopram (CELEXA) 20 MG tablet Take 20 mg by mouth daily.    . furosemide (LASIX) 20 MG tablet Take 20 mg by mouth daily.    Marland Kitchen HYDROcodone-acetaminophen (NORCO/VICODIN) 5-325 MG per tablet Take 1 tablet by mouth every 6 (six) hours as needed for moderate pain.    Marland Kitchen insulin glargine (LANTUS) 100 UNIT/ML injection Inject 100 Units into the skin daily.    . Insulin Glargine (TOUJEO  SOLOSTAR [place]) Inject into the skin as directed.    . insulin glulisine (APIDRA) 100 UNIT/ML injection Inject into the skin as directed.    Marland Kitchen levothyroxine (SYNTHROID, LEVOTHROID) 75 MCG tablet Take 75 mcg by mouth daily before breakfast.    . linagliptin (TRADJENTA) 5 MG TABS tablet Take 5 mg by mouth daily.    Marland Kitchen LINZESS 145 MCG CAPS capsule Take 1 capsule by mouth daily.    Marland Kitchen loratadine (CLARITIN) 10 MG tablet Take 10 mg by mouth daily.    . metolazone (ZAROXOLYN) 2.5 MG tablet Take 2.5 mg by mouth as directed.    . montelukast (SINGULAIR) 10 MG tablet Take 10 mg by mouth at bedtime.    . ONE TOUCH ULTRA TEST test strip As directed    . spironolactone (ALDACTONE) 25 MG tablet Take 0.5 tablets (12.5 mg total) by mouth daily. 90 tablet 3  . temazepam (RESTORIL) 15 MG capsule Take 15 mg by mouth at bedtime.    Marland Kitchen ULORIC 40 MG tablet Take 1  tablet by mouth daily.    . valsartan (DIOVAN) 160 MG tablet Take 160 mg by mouth daily.    . Vitamin D, Ergocalciferol, (DRISDOL) 50000 UNITS CAPS capsule Take 50,000 Units by mouth every 30 (thirty) days.      No current facility-administered medications for this visit.     Past Surgical History  Procedure Laterality Date  . Vesicovaginal fistula closure w/ tah    . Cardiac catheterization  2005    no obstructive CAD per patient  . Cyst removal hand Right 05/2013    thumb  . Trigger finger release  05/213  . Cardiac defibrillator placement  2005    BiV ICD implanted,  LV lead is an epicardial lead  . Implantable cardioverter defibrillator generator change  2008  . Total abdominal hysterectomy       Allergies  Allergen Reactions  . Glucophage [Metformin Hcl] Other (See Comments)    Renal failure  . Ace Inhibitors Swelling and Other (See Comments)    Angioedema  . Advicor [Niacin-Lovastatin Er] Other (See Comments)    Muscle aches  . Bystolic [Nebivolol Hcl] Other (See Comments)    Edema  . Erythromycin Diarrhea and Nausea And Vomiting    *DERIVATIVES*  . Lipitor [Atorvastatin] Other (See Comments)    Muscle aches  . Lopid [Gemfibrozil] Other (See Comments)    Muscle aches  . Statins     Muscle aches      Family History  Problem Relation Age of Onset  . Arrhythmia Mother   . Diabetes Mellitus II Mother     Borderline DM  . Hypertension Mother   . Hypertension Father   . Heart disease Father   . Cancer Father     prostate  . Heart disease Other     (Maternal side) Ischemic heart disease  . Diabetes Mellitus II Other      Social History Ms. Benzinger reports that she has never smoked. She does not have any smokeless tobacco history on file. Ms. Kear reports that she does not drink alcohol.   Review of Systems CONSTITUTIONAL: No weight loss, fever, chills, weakness or fatigue.  HEENT: Eyes: No visual loss, blurred vision, double vision or yellow  sclerae.No hearing loss, sneezing, congestion, runny nose or sore throat.  SKIN: No rash or itching.  CARDIOVASCULAR: per HPI RESPIRATORY: No shortness of breath, cough or sputum.  GASTROINTESTINAL: No anorexia, nausea, vomiting or diarrhea. No abdominal pain or blood.  GENITOURINARY: No burning on urination, no polyuria NEUROLOGICAL: No headache, dizziness, syncope, paralysis, ataxia, numbness or tingling in the extremities. No change in bowel or bladder control.  MUSCULOSKELETAL: No muscle, back pain, joint pain or stiffness.  LYMPHATICS: No enlarged nodes. No history of splenectomy.  PSYCHIATRIC: No history of depression or anxiety.  ENDOCRINOLOGIC: No reports of sweating, cold or heat intolerance. No polyuria or polydipsia.  Marland Kitchen   Physical Examination p 76 bp 112/69 Wt 200 lbs BMI 33 Gen: resting comfortably, no acute distress HEENT: no scleral icterus, pupils equal round and reactive, no palptable cervical adenopathy,  CV: RRR, no m/r/g, no JVD, no carotid bruits Resp: Clear to auscultation bilaterally GI: abdomen is soft, non-tender, non-distended, normal bowel sounds, no hepatosplenomegaly MSK: extremities are warm, no edema.  Skin: warm, no rash Neuro:  no focal deficits Psych: appropriate affect   Diagnostic Studies 09/2012 Echo Carrilion  LVEF 0000000, grade I diastolic dysfunction, mild MR  04/2014 Echo Study Conclusions  - Left ventricle: The cavity size was normal. There was mild focal basal hypertrophy of the septum. Systolic function was normal. The estimated ejection fraction was in the range of 50% to 55%. Wall motion was normal; there were no regional wall motion abnormalities. Doppler parameters are consistent with abnormal left ventricular relaxation (grade 1 diastolic dysfunction). - Ventricular septum: Septal motion showed abnormal function and dyssynergy. - Left atrium: The atrium was mildly dilated.  Impressions:  - Low normal LV  function; septal dyssnergy; grade 1 diastolic dysfunction; mild LAE; mild TR.  Assessment and Plan  1. NICM  - long history as stated above, previously severe LV systolic dysfunction that normalized with medical therapy and BiV ICD. BiV no longer active as reached ERI, was not replaced due to normalized LV function - no current symptoms, continue current meds  2. Hyperlipidemia - will request most recent panel from pcp - continue current statin  3. HTN - at goal of less than 130/80 given DM2 and CKD, continue current meds        Arnoldo Lenis, M.D.

## 2014-11-03 NOTE — Patient Instructions (Signed)
Continue all current medications. Your physician wants you to follow up in:  1 year.  You will receive a reminder letter in the mail one-two months in advance.  If you don't receive a letter, please call our office to schedule the follow up appointment   

## 2014-12-03 LAB — VITAMIN D 25 HYDROXY (VIT D DEFICIENCY, FRACTURES): Vit D, 25-Hydroxy: 25

## 2015-08-26 DIAGNOSIS — N63 Unspecified lump in breast: Secondary | ICD-10-CM | POA: Diagnosis not present

## 2015-09-07 DIAGNOSIS — F410 Panic disorder [episodic paroxysmal anxiety] without agoraphobia: Secondary | ICD-10-CM | POA: Diagnosis not present

## 2015-10-05 DIAGNOSIS — I10 Essential (primary) hypertension: Secondary | ICD-10-CM | POA: Diagnosis not present

## 2015-10-05 DIAGNOSIS — E559 Vitamin D deficiency, unspecified: Secondary | ICD-10-CM | POA: Diagnosis not present

## 2015-10-05 DIAGNOSIS — E119 Type 2 diabetes mellitus without complications: Secondary | ICD-10-CM | POA: Diagnosis not present

## 2015-10-05 DIAGNOSIS — J45909 Unspecified asthma, uncomplicated: Secondary | ICD-10-CM | POA: Diagnosis not present

## 2015-10-05 LAB — HEMOGLOBIN A1C: Hemoglobin A1C: 7.2

## 2015-10-06 DIAGNOSIS — I10 Essential (primary) hypertension: Secondary | ICD-10-CM | POA: Diagnosis not present

## 2015-10-06 DIAGNOSIS — I509 Heart failure, unspecified: Secondary | ICD-10-CM | POA: Diagnosis not present

## 2015-10-06 DIAGNOSIS — I447 Left bundle-branch block, unspecified: Secondary | ICD-10-CM | POA: Diagnosis not present

## 2015-10-06 DIAGNOSIS — J45909 Unspecified asthma, uncomplicated: Secondary | ICD-10-CM | POA: Diagnosis not present

## 2015-10-06 DIAGNOSIS — E039 Hypothyroidism, unspecified: Secondary | ICD-10-CM | POA: Diagnosis not present

## 2015-10-06 DIAGNOSIS — R112 Nausea with vomiting, unspecified: Secondary | ICD-10-CM | POA: Diagnosis not present

## 2015-10-06 DIAGNOSIS — A084 Viral intestinal infection, unspecified: Secondary | ICD-10-CM | POA: Diagnosis not present

## 2015-10-06 DIAGNOSIS — K219 Gastro-esophageal reflux disease without esophagitis: Secondary | ICD-10-CM | POA: Diagnosis not present

## 2015-10-07 DIAGNOSIS — E785 Hyperlipidemia, unspecified: Secondary | ICD-10-CM | POA: Diagnosis not present

## 2015-10-07 DIAGNOSIS — M5137 Other intervertebral disc degeneration, lumbosacral region: Secondary | ICD-10-CM | POA: Diagnosis not present

## 2015-10-07 DIAGNOSIS — E119 Type 2 diabetes mellitus without complications: Secondary | ICD-10-CM | POA: Diagnosis not present

## 2015-10-07 DIAGNOSIS — E039 Hypothyroidism, unspecified: Secondary | ICD-10-CM | POA: Diagnosis not present

## 2015-10-19 LAB — HM MAMMOGRAPHY

## 2015-11-03 DIAGNOSIS — I9589 Other hypotension: Secondary | ICD-10-CM | POA: Diagnosis not present

## 2015-11-03 DIAGNOSIS — E875 Hyperkalemia: Secondary | ICD-10-CM | POA: Diagnosis not present

## 2015-11-03 DIAGNOSIS — I10 Essential (primary) hypertension: Secondary | ICD-10-CM | POA: Diagnosis not present

## 2015-11-03 DIAGNOSIS — N183 Chronic kidney disease, stage 3 (moderate): Secondary | ICD-10-CM | POA: Diagnosis not present

## 2015-11-20 ENCOUNTER — Ambulatory Visit: Payer: BLUE CROSS/BLUE SHIELD | Admitting: Cardiology

## 2015-12-03 ENCOUNTER — Encounter: Payer: Self-pay | Admitting: Cardiology

## 2015-12-03 DIAGNOSIS — I10 Essential (primary) hypertension: Secondary | ICD-10-CM | POA: Diagnosis not present

## 2015-12-03 DIAGNOSIS — M109 Gout, unspecified: Secondary | ICD-10-CM | POA: Diagnosis not present

## 2015-12-10 ENCOUNTER — Encounter: Payer: Self-pay | Admitting: *Deleted

## 2015-12-14 ENCOUNTER — Encounter: Payer: Self-pay | Admitting: *Deleted

## 2015-12-14 ENCOUNTER — Encounter: Payer: Self-pay | Admitting: Cardiology

## 2015-12-14 ENCOUNTER — Ambulatory Visit (INDEPENDENT_AMBULATORY_CARE_PROVIDER_SITE_OTHER): Payer: Medicare Other | Admitting: Cardiology

## 2015-12-14 VITALS — BP 136/79 | HR 87 | Ht 66.0 in | Wt 205.0 lb

## 2015-12-14 DIAGNOSIS — E785 Hyperlipidemia, unspecified: Secondary | ICD-10-CM | POA: Diagnosis not present

## 2015-12-14 DIAGNOSIS — I428 Other cardiomyopathies: Secondary | ICD-10-CM

## 2015-12-14 DIAGNOSIS — I429 Cardiomyopathy, unspecified: Secondary | ICD-10-CM

## 2015-12-14 DIAGNOSIS — I10 Essential (primary) hypertension: Secondary | ICD-10-CM

## 2015-12-14 NOTE — Patient Instructions (Signed)

## 2015-12-14 NOTE — Progress Notes (Signed)
Patient ID: Crystal [name], female   DOB: 14-Aug-1950, 65 y.o.   MRN: [id number]     Clinical Summary Crystal [name] is a 65 y.o.female seen today for follow up of the following medical problems.   1. Chronic systolic heart failure  - long hx of NICM dating back to 2005, with reported LVEF at that time 24% - cath at that time showed no significant CAD.  - She has a chronic LBBB. Had a BiV AICD placed previously with epicardial LV lead with subsequent normalization of LVEF - LVEF improved with BiV pacing to >55%.  - from notes device reached ERI, her RV lead has increasing thresholds around that time. It was decided not to replace the device since normal LVEF and follow her clinically.   - repeat echo 04/2014 LVEF 50-55% - questionable hx of angioedema on ACE-Is, she states has been on ARBs and has tolerated.    - denies any SOB or DOE recently. No Recent LE edema, can have mild abdominal distension at times.   2. CKD - she is followed by Dr Nilda Calamity in [place], [place]  3. Hyperlipidemia - Compliant with crestor - 09/2015: TC 184 TG 401 HDL 33 LDL too high to calculate. Reports dietary indiscretion.   4. DM2 - followed by pcp  5. HTN - checks at work often. Typically 110s/60s - compliant with meds Past Medical History  Diagnosis Date  . Chronic renal insufficiency   . Anemia, iron deficiency   . Angioedema     felt to likely be due to ace inhibitors but says she has had this even off of medicines, appears to be tolerating ARBs chronically  . Nonischemic cardiomyopathy ([place])     s/p ICD in 2005. EF has since recovered  . CHF (congestive heart failure)   . Diabetes mellitus, type 2   . Gout   . Hyperlipidemia   . Obesity   . Systemic hypertension   . Unspecified hypothyroidism   . Intervertebral disc degeneration   . Back pain   . Hypertension   . Automatic implantable cardiac defibrillator in situ     high RV threshold chronically, device was turned off in 2014  . LBBB  (left bundle Rakim Moone block)      Allergies  Allergen Reactions  . Glucophage [Metformin Hcl] Other (See Comments)    Renal failure  . Ace Inhibitors Swelling and Other (See Comments)    Angioedema  . Advicor [Niacin-Lovastatin Er] Other (See Comments)    Muscle aches  . Bystolic [Nebivolol Hcl] Other (See Comments)    Edema  . Erythromycin Diarrhea and Nausea And Vomiting    *DERIVATIVES*  . Lipitor [Atorvastatin] Other (See Comments)    Muscle aches  . Lopid [Gemfibrozil] Other (See Comments)    Muscle aches  . Statins     Muscle aches     Current Outpatient Prescriptions  Medication Sig Dispense Refill  . ALPRAZolam (XANAX) 0.5 MG tablet Take 0.5 mg by mouth at bedtime as needed.     Marland Kitchen aspirin 81 MG tablet Take 81 mg by mouth daily.    . carvedilol (COREG) 25 MG tablet Take 25 mg by mouth 2 (two) times daily with a meal.    . Cholecalciferol (VITAMIN D3) 5000 UNITS CAPS Take 1 capsule by mouth daily.    . citalopram (CELEXA) 20 MG tablet Take 20 mg by mouth daily.    . CRESTOR 5 MG tablet Take 1 tablet by mouth at bedtime.    Marland Kitchen  furosemide (LASIX) 20 MG tablet Take 20 mg by mouth daily.    Marland Kitchen gabapentin (NEURONTIN) 400 MG capsule Take 1 capsule by mouth 2 (two) times daily.    . Insulin Glargine (TOUJEO SOLOSTAR [employer]) Inject into the skin as directed.    . insulin glulisine (APIDRA) 100 UNIT/ML injection Inject into the skin as directed.    Marland Kitchen levothyroxine (SYNTHROID, LEVOTHROID) 75 MCG tablet Take 75 mcg by mouth daily before breakfast.    . linagliptin (TRADJENTA) 5 MG TABS tablet Take 5 mg by mouth daily.    Marland Kitchen LINZESS 145 MCG CAPS capsule Take 1 capsule by mouth daily.    Marland Kitchen loratadine (CLARITIN) 10 MG tablet Take 10 mg by mouth daily.    . montelukast (SINGULAIR) 10 MG tablet Take 10 mg by mouth at bedtime.    . ONE TOUCH ULTRA TEST test strip As directed    . spironolactone (ALDACTONE) 25 MG tablet Take 0.5 tablets (12.5 mg total) by mouth daily. 90 tablet 3  . temazepam  (RESTORIL) 15 MG capsule Take 15 mg by mouth at bedtime.    Marland Kitchen ULORIC 40 MG tablet Take 1 tablet by mouth daily.    . valsartan (DIOVAN) 160 MG tablet Take 160 mg by mouth daily.     No current facility-administered medications for this visit.     Past Surgical History  Procedure Laterality Date  . Vesicovaginal fistula closure w/ tah    . Cardiac catheterization  2005    no obstructive CAD per patient  . Cyst removal hand Right 05/2013    thumb  . Trigger finger release  05/213  . Cardiac defibrillator placement  2005    BiV ICD implanted,  LV lead is an epicardial lead  . Implantable cardioverter defibrillator generator change  2008  . Total abdominal hysterectomy       Allergies  Allergen Reactions  . Glucophage [Metformin Hcl] Other (See Comments)    Renal failure  . Ace Inhibitors Swelling and Other (See Comments)    Angioedema  . Advicor [Niacin-Lovastatin Er] Other (See Comments)    Muscle aches  . Bystolic [Nebivolol Hcl] Other (See Comments)    Edema  . Erythromycin Diarrhea and Nausea And Vomiting    *DERIVATIVES*  . Lipitor [Atorvastatin] Other (See Comments)    Muscle aches  . Lopid [Gemfibrozil] Other (See Comments)    Muscle aches  . Statins     Muscle aches      Family History  Problem Relation Age of Onset  . Arrhythmia Mother   . Diabetes Mellitus II Mother     Borderline DM  . Hypertension Mother   . Hypertension Father   . Heart disease Father   . Cancer Father     prostate  . Heart disease Other     (Maternal side) Ischemic heart disease  . Diabetes Mellitus II Other      Social History Crystal [name] reports that she has never smoked. She has never used smokeless tobacco. Crystal [name] reports that she does not drink alcohol.   Review of Systems CONSTITUTIONAL: No weight loss, fever, chills, weakness or fatigue.  HEENT: Eyes: No visual loss, blurred vision, double vision or yellow sclerae.No hearing loss, sneezing, congestion, runny  nose or sore throat.  SKIN: No rash or itching.  CARDIOVASCULAR: no chest pain, no palpitations RESPIRATORY: No shortness of breath, cough or sputum.  GASTROINTESTINAL: No anorexia, nausea, vomiting or diarrhea. No abdominal pain or blood.  GENITOURINARY: No burning  on urination, no polyuria NEUROLOGICAL: No headache, dizziness, syncope, paralysis, ataxia, numbness or tingling in the extremities. No change in bowel or bladder control.  MUSCULOSKELETAL: No muscle, back pain, joint pain or stiffness.  LYMPHATICS: No enlarged nodes. No history of splenectomy.  PSYCHIATRIC: No history of depression or anxiety.  ENDOCRINOLOGIC: No reports of sweating, cold or heat intolerance. No polyuria or polydipsia.  Marland Kitchen   Physical Examination Filed Vitals:   12/14/15 1456  BP: 136/79  Pulse: 87   Filed Vitals:   12/14/15 1456  Height: 5\' 6"  (1.676 m)  Weight: 205 lb (92.987 kg)    Gen: resting comfortably, no acute distress HEENT: no scleral icterus, pupils equal round and reactive, no palptable cervical adenopathy,  CV: RRR, no m/r/g, nno jvd Resp: Clear to auscultation bilaterally GI: abdomen is soft, non-tender, non-distended, normal bowel sounds, no hepatosplenomegaly MSK: extremities are warm, no edema.  Skin: warm, no rash Neuro:  no focal deficits Psych: appropriate affect   Diagnostic Studies  09/2012 Echo Carrilion  LVEF 0000000, grade I diastolic dysfunction, mild MR  04/2014 Echo Study Conclusions  - Left ventricle: The cavity size was normal. There was mild focal basal hypertrophy of the septum. Systolic function was normal. The estimated ejection fraction was in the range of 50% to 55%. Wall motion was normal; there were no regional wall motion abnormalities. Doppler parameters are consistent with abnormal left ventricular relaxation (grade 1 diastolic dysfunction). - Ventricular septum: Septal motion showed abnormal function and dyssynergy. - Left atrium: The  atrium was mildly dilated.  Impressions:  - Low normal LV function; septal dyssnergy; grade 1 diastolic dysfunction; mild LAE; mild TR.    Assessment and Plan  1. NICM  - long history as stated above, previously severe LV systolic dysfunction that normalized with medical therapy and BiV ICD. BiV no longer active as reached ERI, was not replaced due to normalized LV function - she has no current symptoms, continue current meds  2. Hyperlipidemia - elevated TGs, counseled on dietary and lifestyle modifications. Continue crestor.  3. HTN - at goal, continue current meds      Arnoldo Lenis, M.D.

## 2016-02-08 ENCOUNTER — Encounter: Payer: Self-pay | Admitting: Cardiology

## 2016-02-08 DIAGNOSIS — I10 Essential (primary) hypertension: Secondary | ICD-10-CM | POA: Diagnosis not present

## 2016-02-08 DIAGNOSIS — E875 Hyperkalemia: Secondary | ICD-10-CM | POA: Diagnosis not present

## 2016-02-08 DIAGNOSIS — N183 Chronic kidney disease, stage 3 (moderate): Secondary | ICD-10-CM | POA: Diagnosis not present

## 2016-02-11 DIAGNOSIS — H538 Other visual disturbances: Secondary | ICD-10-CM | POA: Diagnosis not present

## 2016-02-11 DIAGNOSIS — E119 Type 2 diabetes mellitus without complications: Secondary | ICD-10-CM | POA: Diagnosis not present

## 2016-02-11 DIAGNOSIS — H25813 Combined forms of age-related cataract, bilateral: Secondary | ICD-10-CM | POA: Diagnosis not present

## 2016-02-11 DIAGNOSIS — H43393 Other vitreous opacities, bilateral: Secondary | ICD-10-CM | POA: Diagnosis not present

## 2016-02-18 LAB — CBC AND DIFFERENTIAL
HCT: 28 — AB (ref 36–46)
HEMOGLOBIN: 9.1 — AB (ref 12.0–16.0)
Platelets: 264 (ref 150–399)
WBC: 6.2

## 2016-02-18 LAB — BASIC METABOLIC PANEL
BUN: 30 — AB (ref 4–21)
CREATININE: 1.4 — AB (ref 0.5–1.1)
GLUCOSE: 122
POTASSIUM: 4.5 (ref 3.4–5.3)
SODIUM: 137 (ref 137–147)

## 2016-03-15 ENCOUNTER — Encounter: Payer: Self-pay | Admitting: *Deleted

## 2016-03-16 ENCOUNTER — Encounter: Payer: Self-pay | Admitting: *Deleted

## 2016-03-16 ENCOUNTER — Ambulatory Visit (INDEPENDENT_AMBULATORY_CARE_PROVIDER_SITE_OTHER): Payer: Medicare Other | Admitting: Cardiology

## 2016-03-16 VITALS — BP 117/75 | HR 86 | Ht 66.0 in | Wt 212.0 lb

## 2016-03-16 DIAGNOSIS — R0602 Shortness of breath: Secondary | ICD-10-CM

## 2016-03-16 DIAGNOSIS — I10 Essential (primary) hypertension: Secondary | ICD-10-CM

## 2016-03-16 DIAGNOSIS — I428 Other cardiomyopathies: Secondary | ICD-10-CM

## 2016-03-16 DIAGNOSIS — E785 Hyperlipidemia, unspecified: Secondary | ICD-10-CM | POA: Diagnosis not present

## 2016-03-16 DIAGNOSIS — I429 Cardiomyopathy, unspecified: Secondary | ICD-10-CM | POA: Diagnosis not present

## 2016-03-16 MED ORDER — FUROSEMIDE 20 MG PO TABS: 20.0000 mg | ORAL_TABLET | Freq: Every day | ORAL | 3 refills | Status: DC

## 2016-03-16 NOTE — Progress Notes (Signed)
Clinical Summary Ms. Sumey is a 65 y.o.female seen today for follow up of the following medical problems.   1. Chronic systolic heart failure  - long hx of NICM dating back to 2005, with reported LVEF at that time 24% - cath at that time showed no significant CAD.  - She has a chronic LBBB. Had a BiV AICD placed previously with epicardial LV lead with subsequent normalization of LVEF - LVEF improved with BiV pacing to >55%.  - from notes device reached ERI, her RV lead has increasing thresholds around that time. It was decided not to replace the device since normal LVEF and follow her clinically.   - repeat echo 04/2014 LVEF 50-55% - questionable hx of angioedema on ACE-Is, she states has been on ARBs and has tolerated.    - increased SOB/DOE. LE edema, abdominal distension. Weight up to 217 lbs. Took 60mg  of lasix x 3 days with some improvement. - aldactone stopped by nephrologist due hyperkalemia.    2. CKD - she is followed by Dr Nilda Calamity in [place], [place]  3. Hyperlipidemia - Compliant with crestor.   4. DM2 - followed by pcp  5. HTN - Typically 110s/60s at home - compliant with meds Past Medical History:  Diagnosis Date  . Anemia, iron deficiency   . Angioedema    felt to likely be due to ace inhibitors but says she has had this even off of medicines, appears to be tolerating ARBs chronically  . Automatic implantable cardiac defibrillator in situ    high RV threshold chronically, device was turned off in 2014  . Back pain   . CHF (congestive heart failure) ([place])   . Chronic renal insufficiency   . Diabetes mellitus, type 2 ([place])   . Gout   . Hyperlipidemia   . Hypertension   . Intervertebral disc degeneration   . LBBB (left bundle Elyna Pangilinan block)   . Nonischemic cardiomyopathy ([place])    s/p ICD in 2005. EF has since recovered  . Obesity   . Systemic hypertension   . Unspecified hypothyroidism      Allergies  Allergen Reactions  . Glucophage  [Metformin Hcl] Other (See Comments)    Renal failure  . Ace Inhibitors Swelling and Other (See Comments)    Angioedema  . Advicor [Niacin-Lovastatin Er] Other (See Comments)    Muscle aches  . Bystolic [Nebivolol Hcl] Other (See Comments)    Edema  . Erythromycin Diarrhea and Nausea And Vomiting    *DERIVATIVES*  . Lipitor [Atorvastatin] Other (See Comments)    Muscle aches  . Lopid [Gemfibrozil] Other (See Comments)    Muscle aches  . Statins     Muscle aches     Current Outpatient Prescriptions  Medication Sig Dispense Refill  . aspirin 81 MG tablet Take 81 mg by mouth daily.    . carvedilol (COREG) 25 MG tablet Take 25 mg by mouth 2 (two) times daily with a meal.    . CRESTOR 5 MG tablet Take 1 tablet by mouth at bedtime.    . furosemide (LASIX) 20 MG tablet Take 20 mg by mouth daily.    Marland Kitchen HYDROcodone-acetaminophen (NORCO/VICODIN) 5-325 MG tablet Take 1 tablet by mouth every 6 (six) hours as needed.    . Insulin Glargine (TOUJEO SOLOSTAR [place]) Inject 100 Units into the skin as directed.     . insulin glulisine (APIDRA) 100 UNIT/ML injection Inject 20 Units into the skin 3 (three) times daily with meals.     Marland Kitchen  levothyroxine (SYNTHROID, LEVOTHROID) 88 MCG tablet Take 1 tablet by mouth daily.    Marland Kitchen linagliptin (TRADJENTA) 5 MG TABS tablet Take 5 mg by mouth daily.    Marland Kitchen LINZESS 145 MCG CAPS capsule Take 1 capsule by mouth daily.    Marland Kitchen loratadine (CLARITIN) 10 MG tablet Take 10 mg by mouth daily.    . magnesium oxide (MAG-OX) 400 MG tablet Take 400 mg by mouth 2 (two) times daily.    . montelukast (SINGULAIR) 10 MG tablet Take 10 mg by mouth at bedtime.    . Omega-3 Fatty Acids (FISH OIL) 1000 MG CAPS Take 1 capsule by mouth 3 (three) times daily.    . ONE TOUCH ULTRA TEST test strip As directed    . spironolactone (ALDACTONE) 25 MG tablet Take 12.5 mg by mouth 3 (three) times a week.    . temazepam (RESTORIL) 15 MG capsule Take 15 mg by mouth at bedtime.    Marland Kitchen ULORIC 40 MG tablet  Take 1 tablet by mouth daily.    . valsartan (DIOVAN) 160 MG tablet Take 160 mg by mouth daily.    . Vitamin D, Ergocalciferol, (DRISDOL) 50000 units CAPS capsule Take 1 capsule by mouth once a week.  3   No current facility-administered medications for this visit.      Past Surgical History:  Procedure Laterality Date  . CARDIAC CATHETERIZATION  2005   no obstructive CAD per patient  . CARDIAC DEFIBRILLATOR PLACEMENT  2005   BiV ICD implanted,  LV lead is an epicardial lead  . CYST REMOVAL HAND Right 05/2013   thumb  . IMPLANTABLE CARDIOVERTER DEFIBRILLATOR GENERATOR CHANGE  2008  . TOTAL ABDOMINAL HYSTERECTOMY    . TRIGGER FINGER RELEASE  05/213  . VESICOVAGINAL FISTULA CLOSURE W/ TAH       Allergies  Allergen Reactions  . Glucophage [Metformin Hcl] Other (See Comments)    Renal failure  . Ace Inhibitors Swelling and Other (See Comments)    Angioedema  . Advicor [Niacin-Lovastatin Er] Other (See Comments)    Muscle aches  . Bystolic [Nebivolol Hcl] Other (See Comments)    Edema  . Erythromycin Diarrhea and Nausea And Vomiting    *DERIVATIVES*  . Lipitor [Atorvastatin] Other (See Comments)    Muscle aches  . Lopid [Gemfibrozil] Other (See Comments)    Muscle aches  . Statins     Muscle aches      Family History  Problem Relation Age of Onset  . Hypertension Father   . Heart disease Father   . Cancer Father     prostate  . Heart disease Other     (Maternal side) Ischemic heart disease  . Diabetes Mellitus II Other   . Arrhythmia Mother   . Diabetes Mellitus II Mother     Borderline DM  . Hypertension Mother      Social History Ms. Devault reports that she has never smoked. She has never used smokeless tobacco. Ms. Immel reports that she does not drink alcohol.   Review of Systems CONSTITUTIONAL: No weight loss, fever, chills, weakness or fatigue.  HEENT: Eyes: No visual loss, blurred vision, double vision or yellow sclerae.No hearing loss,  sneezing, congestion, runny nose or sore throat.  SKIN: No rash or itching.  CARDIOVASCULAR: per HPI RESPIRATORY: per HPI GASTROINTESTINAL: No anorexia, nausea, vomiting or diarrhea. No abdominal pain or blood.  GENITOURINARY: No burning on urination, no polyuria NEUROLOGICAL: No headache, dizziness, syncope, paralysis, ataxia, numbness or tingling in the extremities.  No change in bowel or bladder control.  MUSCULOSKELETAL: No muscle, back pain, joint pain or stiffness.  LYMPHATICS: No enlarged nodes. No history of splenectomy.  PSYCHIATRIC: No history of depression or anxiety.  ENDOCRINOLOGIC: No reports of sweating, cold or heat intolerance. No polyuria or polydipsia.  Marland Kitchen   Physical Examination Vitals:   03/16/16 0949  BP: 117/75  Pulse: 86   Vitals:   03/16/16 0949  Weight: 212 lb (96.2 kg)  Height: 5\' 6"  (1.676 m)    Gen: resting comfortably, no acute distress HEENT: no scleral icterus, pupils equal round and reactive, no palptable cervical adenopathy,  CV: RRR, no m/r/g, no jvd Resp: Clear to auscultation bilaterally GI: abdomen is soft, non-tender, non-distended, normal bowel sounds, no hepatosplenomegaly MSK: extremities are warm, no edema.  Skin: warm, no rash Neuro:  no focal deficits Psych: appropriate affect   Diagnostic Studies 09/2012 Echo Carrilion  LVEF 0000000, grade I diastolic dysfunction, mild MR  04/2014 Echo Study Conclusions  - Left ventricle: The cavity size was normal. There was mild focal basal hypertrophy of the septum. Systolic function was normal. The estimated ejection fraction was in the range of 50% to 55%. Wall motion was normal; there were no regional wall motion abnormalities. Doppler parameters are consistent with abnormal left ventricular relaxation (grade 1 diastolic dysfunction). - Ventricular septum: Septal motion showed abnormal function and dyssynergy. - Left atrium: The atrium was mildly  dilated.  Impressions:  - Low normal LV function; septal dyssnergy; grade 1 diastolic dysfunction; mild LAE; mild TR.    Assessment and Plan   1. NICM  - long history as stated above, previously severe LV systolic dysfunction that normalized with medical therapy and BiV ICD. BiV no longer active as reached ERI, was not replaced due to normalized LV function - recent severe volume overload. We will repeat echo. Increase lasix to 60mg  daily x 3 days, then 20mg  daily. Check labs in 1 week  2. Hyperlipidemia - Continue crestor.  3. HTN - at goal, continue current meds   F/u 1 month   Arnoldo Lenis, M.D.

## 2016-03-16 NOTE — Patient Instructions (Signed)
Your physician recommends that you schedule a follow-up appointment in: [place] [address]. Avinger  Your physician has recommended you make the following change in your medication:   TAKE 60 MG OF LASIX FOR THE NEXT 3 DAYS THEN GO BACK TO 20 MG DAILY  Your physician has requested that you have an echocardiogram. Echocardiography is a painless test that uses sound waves to create images of your heart. It provides your doctor with information about the size and shape of your heart and how well your heart's chambers and valves are working. This procedure takes approximately one hour. There are no restrictions for this procedure.   Your physician recommends that you return for lab work in: 1 WEEK BMP/MG  Thank you for choosing [facility]!!

## 2016-03-18 ENCOUNTER — Encounter: Payer: Self-pay | Admitting: Cardiology

## 2016-03-29 DIAGNOSIS — I10 Essential (primary) hypertension: Secondary | ICD-10-CM | POA: Diagnosis not present

## 2016-03-29 DIAGNOSIS — N179 Acute kidney failure, unspecified: Secondary | ICD-10-CM | POA: Diagnosis not present

## 2016-03-29 DIAGNOSIS — E559 Vitamin D deficiency, unspecified: Secondary | ICD-10-CM | POA: Diagnosis not present

## 2016-03-29 DIAGNOSIS — E039 Hypothyroidism, unspecified: Secondary | ICD-10-CM | POA: Diagnosis not present

## 2016-03-29 DIAGNOSIS — E119 Type 2 diabetes mellitus without complications: Secondary | ICD-10-CM | POA: Diagnosis not present

## 2016-03-31 ENCOUNTER — Other Ambulatory Visit: Payer: Self-pay

## 2016-03-31 ENCOUNTER — Ambulatory Visit (INDEPENDENT_AMBULATORY_CARE_PROVIDER_SITE_OTHER): Payer: Medicare Other

## 2016-03-31 DIAGNOSIS — R0602 Shortness of breath: Secondary | ICD-10-CM

## 2016-04-05 ENCOUNTER — Telehealth: Payer: Self-pay | Admitting: *Deleted

## 2016-04-05 NOTE — Telephone Encounter (Signed)
Notes Recorded by Laurine Blazer, LPN on 4/51/4604 at 7:99 AM EDT Left message to return call.  ------  Notes Recorded by Arnoldo Lenis, MD on 04/01/2016 at 4:40 PM EDT Echo overall looks good, heart function overall looks good

## 2016-04-06 NOTE — Telephone Encounter (Signed)
Patient notified and verbalized understanding.  Copy to pmd.  Follow up scheduled for 04/25/2016 with Dr. Harl Bowie.

## 2016-04-06 NOTE — Telephone Encounter (Signed)
Patient returned your call.

## 2016-04-13 DIAGNOSIS — H2513 Age-related nuclear cataract, bilateral: Secondary | ICD-10-CM | POA: Diagnosis not present

## 2016-04-25 ENCOUNTER — Ambulatory Visit: Payer: Medicare Other | Admitting: Cardiology

## 2016-05-16 ENCOUNTER — Ambulatory Visit: Payer: Medicare Other | Admitting: Cardiology

## 2016-05-27 DIAGNOSIS — E871 Hypo-osmolality and hyponatremia: Secondary | ICD-10-CM | POA: Diagnosis present

## 2016-05-27 DIAGNOSIS — E669 Obesity, unspecified: Secondary | ICD-10-CM | POA: Diagnosis not present

## 2016-05-27 DIAGNOSIS — I959 Hypotension, unspecified: Secondary | ICD-10-CM | POA: Diagnosis present

## 2016-05-27 DIAGNOSIS — R42 Dizziness and giddiness: Secondary | ICD-10-CM | POA: Diagnosis not present

## 2016-05-27 DIAGNOSIS — R55 Syncope and collapse: Secondary | ICD-10-CM | POA: Diagnosis present

## 2016-05-27 DIAGNOSIS — E785 Hyperlipidemia, unspecified: Secondary | ICD-10-CM | POA: Diagnosis present

## 2016-05-27 DIAGNOSIS — R5383 Other fatigue: Secondary | ICD-10-CM | POA: Diagnosis not present

## 2016-05-27 DIAGNOSIS — I161 Hypertensive emergency: Secondary | ICD-10-CM | POA: Diagnosis not present

## 2016-05-27 DIAGNOSIS — E039 Hypothyroidism, unspecified: Secondary | ICD-10-CM | POA: Diagnosis present

## 2016-05-27 DIAGNOSIS — F329 Major depressive disorder, single episode, unspecified: Secondary | ICD-10-CM | POA: Diagnosis not present

## 2016-05-27 DIAGNOSIS — R0600 Dyspnea, unspecified: Secondary | ICD-10-CM | POA: Diagnosis not present

## 2016-05-27 DIAGNOSIS — M109 Gout, unspecified: Secondary | ICD-10-CM | POA: Diagnosis not present

## 2016-05-27 DIAGNOSIS — Z6835 Body mass index (BMI) 35.0-35.9, adult: Secondary | ICD-10-CM | POA: Diagnosis not present

## 2016-05-27 DIAGNOSIS — E860 Dehydration: Secondary | ICD-10-CM | POA: Diagnosis present

## 2016-05-27 DIAGNOSIS — I429 Cardiomyopathy, unspecified: Secondary | ICD-10-CM | POA: Diagnosis present

## 2016-05-27 DIAGNOSIS — I2510 Atherosclerotic heart disease of native coronary artery without angina pectoris: Secondary | ICD-10-CM | POA: Diagnosis not present

## 2016-05-27 DIAGNOSIS — Z794 Long term (current) use of insulin: Secondary | ICD-10-CM | POA: Diagnosis not present

## 2016-05-27 DIAGNOSIS — D649 Anemia, unspecified: Secondary | ICD-10-CM | POA: Diagnosis not present

## 2016-05-27 DIAGNOSIS — I110 Hypertensive heart disease with heart failure: Secondary | ICD-10-CM | POA: Diagnosis not present

## 2016-05-27 DIAGNOSIS — Z881 Allergy status to other antibiotic agents status: Secondary | ICD-10-CM | POA: Diagnosis not present

## 2016-05-27 DIAGNOSIS — I071 Rheumatic tricuspid insufficiency: Secondary | ICD-10-CM | POA: Diagnosis not present

## 2016-05-27 DIAGNOSIS — M5416 Radiculopathy, lumbar region: Secondary | ICD-10-CM | POA: Diagnosis not present

## 2016-05-27 DIAGNOSIS — R531 Weakness: Secondary | ICD-10-CM | POA: Diagnosis present

## 2016-05-27 DIAGNOSIS — E119 Type 2 diabetes mellitus without complications: Secondary | ICD-10-CM | POA: Diagnosis not present

## 2016-05-27 DIAGNOSIS — Z888 Allergy status to other drugs, medicaments and biological substances status: Secondary | ICD-10-CM | POA: Diagnosis not present

## 2016-05-27 DIAGNOSIS — I499 Cardiac arrhythmia, unspecified: Secondary | ICD-10-CM | POA: Diagnosis not present

## 2016-05-27 DIAGNOSIS — K219 Gastro-esophageal reflux disease without esophagitis: Secondary | ICD-10-CM | POA: Diagnosis not present

## 2016-05-27 DIAGNOSIS — R9431 Abnormal electrocardiogram [ECG] [EKG]: Secondary | ICD-10-CM | POA: Diagnosis not present

## 2016-05-27 DIAGNOSIS — I5020 Unspecified systolic (congestive) heart failure: Secondary | ICD-10-CM | POA: Diagnosis present

## 2016-05-27 DIAGNOSIS — J45909 Unspecified asthma, uncomplicated: Secondary | ICD-10-CM | POA: Diagnosis present

## 2016-05-28 DIAGNOSIS — I429 Cardiomyopathy, unspecified: Secondary | ICD-10-CM | POA: Diagnosis not present

## 2016-05-28 DIAGNOSIS — R55 Syncope and collapse: Secondary | ICD-10-CM | POA: Diagnosis not present

## 2016-05-28 DIAGNOSIS — I2510 Atherosclerotic heart disease of native coronary artery without angina pectoris: Secondary | ICD-10-CM | POA: Diagnosis not present

## 2016-05-28 DIAGNOSIS — I071 Rheumatic tricuspid insufficiency: Secondary | ICD-10-CM | POA: Diagnosis not present

## 2016-05-28 DIAGNOSIS — I5020 Unspecified systolic (congestive) heart failure: Secondary | ICD-10-CM | POA: Diagnosis not present

## 2016-05-28 DIAGNOSIS — I959 Hypotension, unspecified: Secondary | ICD-10-CM | POA: Diagnosis not present

## 2016-05-28 DIAGNOSIS — E871 Hypo-osmolality and hyponatremia: Secondary | ICD-10-CM | POA: Diagnosis not present

## 2016-05-28 DIAGNOSIS — R531 Weakness: Secondary | ICD-10-CM | POA: Diagnosis not present

## 2016-05-28 DIAGNOSIS — E860 Dehydration: Secondary | ICD-10-CM | POA: Diagnosis not present

## 2016-06-29 DIAGNOSIS — N183 Chronic kidney disease, stage 3 (moderate): Secondary | ICD-10-CM | POA: Diagnosis not present

## 2016-06-29 DIAGNOSIS — I1310 Hypertensive heart and chronic kidney disease without heart failure, with stage 1 through stage 4 chronic kidney disease, or unspecified chronic kidney disease: Secondary | ICD-10-CM | POA: Diagnosis not present

## 2016-06-30 DIAGNOSIS — H25812 Combined forms of age-related cataract, left eye: Secondary | ICD-10-CM | POA: Diagnosis not present

## 2016-06-30 DIAGNOSIS — Z961 Presence of intraocular lens: Secondary | ICD-10-CM | POA: Diagnosis not present

## 2016-06-30 DIAGNOSIS — H2512 Age-related nuclear cataract, left eye: Secondary | ICD-10-CM | POA: Diagnosis not present

## 2016-07-11 DIAGNOSIS — Z961 Presence of intraocular lens: Secondary | ICD-10-CM | POA: Diagnosis not present

## 2016-07-11 DIAGNOSIS — H25811 Combined forms of age-related cataract, right eye: Secondary | ICD-10-CM | POA: Diagnosis not present

## 2016-07-11 DIAGNOSIS — H2511 Age-related nuclear cataract, right eye: Secondary | ICD-10-CM | POA: Diagnosis not present

## 2016-07-25 HISTORY — PX: CARDIAC CATHETERIZATION: SHX172

## 2016-07-26 DIAGNOSIS — M109 Gout, unspecified: Secondary | ICD-10-CM | POA: Diagnosis not present

## 2016-07-26 DIAGNOSIS — I1310 Hypertensive heart and chronic kidney disease without heart failure, with stage 1 through stage 4 chronic kidney disease, or unspecified chronic kidney disease: Secondary | ICD-10-CM | POA: Diagnosis not present

## 2016-07-26 DIAGNOSIS — E1165 Type 2 diabetes mellitus with hyperglycemia: Secondary | ICD-10-CM | POA: Diagnosis not present

## 2016-07-26 DIAGNOSIS — E559 Vitamin D deficiency, unspecified: Secondary | ICD-10-CM | POA: Diagnosis not present

## 2016-07-26 DIAGNOSIS — N183 Chronic kidney disease, stage 3 (moderate): Secondary | ICD-10-CM | POA: Diagnosis not present

## 2016-07-26 DIAGNOSIS — E1121 Type 2 diabetes mellitus with diabetic nephropathy: Secondary | ICD-10-CM | POA: Diagnosis not present

## 2016-07-26 LAB — BASIC METABOLIC PANEL
BUN: 36 — AB (ref 4–21)
CREATININE: 1.5 — AB (ref 0.5–1.1)
POTASSIUM: 4.4 (ref 3.4–5.3)

## 2016-07-26 LAB — CBC AND DIFFERENTIAL
HCT: 41 (ref 36–46)
Hemoglobin: 13.1 (ref 12.0–16.0)
PLATELETS: 223 (ref 150–399)
WBC: 8.4

## 2016-08-02 DIAGNOSIS — I447 Left bundle-branch block, unspecified: Secondary | ICD-10-CM | POA: Diagnosis not present

## 2016-08-02 DIAGNOSIS — E119 Type 2 diabetes mellitus without complications: Secondary | ICD-10-CM | POA: Diagnosis not present

## 2016-08-02 DIAGNOSIS — I340 Nonrheumatic mitral (valve) insufficiency: Secondary | ICD-10-CM | POA: Diagnosis not present

## 2016-08-02 DIAGNOSIS — R0609 Other forms of dyspnea: Secondary | ICD-10-CM | POA: Diagnosis not present

## 2016-08-02 DIAGNOSIS — I429 Cardiomyopathy, unspecified: Secondary | ICD-10-CM | POA: Diagnosis not present

## 2016-08-02 DIAGNOSIS — R55 Syncope and collapse: Secondary | ICD-10-CM | POA: Diagnosis not present

## 2016-08-02 DIAGNOSIS — I10 Essential (primary) hypertension: Secondary | ICD-10-CM | POA: Diagnosis not present

## 2016-08-02 DIAGNOSIS — N08 Glomerular disorders in diseases classified elsewhere: Secondary | ICD-10-CM | POA: Diagnosis not present

## 2016-08-13 DIAGNOSIS — S300XXA Contusion of lower back and pelvis, initial encounter: Secondary | ICD-10-CM | POA: Diagnosis not present

## 2016-08-17 DIAGNOSIS — R0609 Other forms of dyspnea: Secondary | ICD-10-CM | POA: Diagnosis not present

## 2016-08-26 DIAGNOSIS — N183 Chronic kidney disease, stage 3 (moderate): Secondary | ICD-10-CM | POA: Diagnosis not present

## 2016-08-30 DIAGNOSIS — Z01810 Encounter for preprocedural cardiovascular examination: Secondary | ICD-10-CM | POA: Diagnosis not present

## 2016-08-30 DIAGNOSIS — E119 Type 2 diabetes mellitus without complications: Secondary | ICD-10-CM | POA: Diagnosis not present

## 2016-08-30 DIAGNOSIS — I447 Left bundle-branch block, unspecified: Secondary | ICD-10-CM | POA: Diagnosis not present

## 2016-09-01 DIAGNOSIS — Z79899 Other long term (current) drug therapy: Secondary | ICD-10-CM | POA: Diagnosis not present

## 2016-09-01 DIAGNOSIS — I340 Nonrheumatic mitral (valve) insufficiency: Secondary | ICD-10-CM | POA: Diagnosis not present

## 2016-09-01 DIAGNOSIS — E1122 Type 2 diabetes mellitus with diabetic chronic kidney disease: Secondary | ICD-10-CM | POA: Diagnosis not present

## 2016-09-01 DIAGNOSIS — Z888 Allergy status to other drugs, medicaments and biological substances status: Secondary | ICD-10-CM | POA: Diagnosis not present

## 2016-09-01 DIAGNOSIS — Z794 Long term (current) use of insulin: Secondary | ICD-10-CM | POA: Diagnosis not present

## 2016-09-01 DIAGNOSIS — Z95810 Presence of automatic (implantable) cardiac defibrillator: Secondary | ICD-10-CM | POA: Diagnosis not present

## 2016-09-01 DIAGNOSIS — R0609 Other forms of dyspnea: Secondary | ICD-10-CM | POA: Diagnosis not present

## 2016-09-01 DIAGNOSIS — R55 Syncope and collapse: Secondary | ICD-10-CM | POA: Diagnosis not present

## 2016-09-01 DIAGNOSIS — I447 Left bundle-branch block, unspecified: Secondary | ICD-10-CM | POA: Diagnosis not present

## 2016-09-01 DIAGNOSIS — I429 Cardiomyopathy, unspecified: Secondary | ICD-10-CM | POA: Diagnosis not present

## 2016-09-01 DIAGNOSIS — Z7982 Long term (current) use of aspirin: Secondary | ICD-10-CM | POA: Diagnosis not present

## 2016-09-01 DIAGNOSIS — I1310 Hypertensive heart and chronic kidney disease without heart failure, with stage 1 through stage 4 chronic kidney disease, or unspecified chronic kidney disease: Secondary | ICD-10-CM | POA: Diagnosis not present

## 2016-09-01 DIAGNOSIS — R9439 Abnormal result of other cardiovascular function study: Secondary | ICD-10-CM | POA: Diagnosis not present

## 2016-09-01 DIAGNOSIS — N183 Chronic kidney disease, stage 3 (moderate): Secondary | ICD-10-CM | POA: Diagnosis not present

## 2016-09-21 DIAGNOSIS — E1121 Type 2 diabetes mellitus with diabetic nephropathy: Secondary | ICD-10-CM | POA: Diagnosis not present

## 2016-09-21 DIAGNOSIS — I1310 Hypertensive heart and chronic kidney disease without heart failure, with stage 1 through stage 4 chronic kidney disease, or unspecified chronic kidney disease: Secondary | ICD-10-CM | POA: Diagnosis not present

## 2016-09-21 DIAGNOSIS — E1165 Type 2 diabetes mellitus with hyperglycemia: Secondary | ICD-10-CM | POA: Diagnosis not present

## 2016-09-21 DIAGNOSIS — N183 Chronic kidney disease, stage 3 (moderate): Secondary | ICD-10-CM | POA: Diagnosis not present

## 2016-10-10 DIAGNOSIS — R202 Paresthesia of skin: Secondary | ICD-10-CM | POA: Diagnosis not present

## 2016-10-10 DIAGNOSIS — E7800 Pure hypercholesterolemia, unspecified: Secondary | ICD-10-CM | POA: Diagnosis not present

## 2016-10-10 DIAGNOSIS — E785 Hyperlipidemia, unspecified: Secondary | ICD-10-CM | POA: Diagnosis not present

## 2016-10-10 DIAGNOSIS — E119 Type 2 diabetes mellitus without complications: Secondary | ICD-10-CM | POA: Diagnosis not present

## 2016-10-10 DIAGNOSIS — I509 Heart failure, unspecified: Secondary | ICD-10-CM | POA: Diagnosis not present

## 2016-10-10 DIAGNOSIS — E039 Hypothyroidism, unspecified: Secondary | ICD-10-CM | POA: Diagnosis not present

## 2016-10-18 DIAGNOSIS — Z1231 Encounter for screening mammogram for malignant neoplasm of breast: Secondary | ICD-10-CM | POA: Diagnosis not present

## 2016-10-21 DIAGNOSIS — I509 Heart failure, unspecified: Secondary | ICD-10-CM | POA: Diagnosis not present

## 2016-11-17 DIAGNOSIS — G4761 Periodic limb movement disorder: Secondary | ICD-10-CM | POA: Diagnosis not present

## 2016-11-17 DIAGNOSIS — E669 Obesity, unspecified: Secondary | ICD-10-CM | POA: Diagnosis not present

## 2016-11-17 DIAGNOSIS — G4733 Obstructive sleep apnea (adult) (pediatric): Secondary | ICD-10-CM | POA: Diagnosis not present

## 2016-11-17 DIAGNOSIS — Z6834 Body mass index (BMI) 34.0-34.9, adult: Secondary | ICD-10-CM | POA: Diagnosis not present

## 2016-11-17 DIAGNOSIS — R0902 Hypoxemia: Secondary | ICD-10-CM | POA: Diagnosis not present

## 2016-11-17 DIAGNOSIS — R0683 Snoring: Secondary | ICD-10-CM | POA: Diagnosis not present

## 2016-11-22 DIAGNOSIS — R600 Localized edema: Secondary | ICD-10-CM | POA: Diagnosis not present

## 2016-11-29 DIAGNOSIS — E1121 Type 2 diabetes mellitus with diabetic nephropathy: Secondary | ICD-10-CM | POA: Diagnosis not present

## 2016-11-29 DIAGNOSIS — E559 Vitamin D deficiency, unspecified: Secondary | ICD-10-CM | POA: Diagnosis not present

## 2016-11-29 DIAGNOSIS — I1310 Hypertensive heart and chronic kidney disease without heart failure, with stage 1 through stage 4 chronic kidney disease, or unspecified chronic kidney disease: Secondary | ICD-10-CM | POA: Diagnosis not present

## 2016-11-29 DIAGNOSIS — I509 Heart failure, unspecified: Secondary | ICD-10-CM | POA: Diagnosis not present

## 2016-11-29 DIAGNOSIS — N183 Chronic kidney disease, stage 3 (moderate): Secondary | ICD-10-CM | POA: Diagnosis not present

## 2016-11-29 DIAGNOSIS — E1165 Type 2 diabetes mellitus with hyperglycemia: Secondary | ICD-10-CM | POA: Diagnosis not present

## 2016-11-30 DIAGNOSIS — N170 Acute kidney failure with tubular necrosis: Secondary | ICD-10-CM | POA: Diagnosis not present

## 2016-11-30 DIAGNOSIS — N183 Chronic kidney disease, stage 3 (moderate): Secondary | ICD-10-CM | POA: Diagnosis not present

## 2016-11-30 DIAGNOSIS — G4733 Obstructive sleep apnea (adult) (pediatric): Secondary | ICD-10-CM | POA: Diagnosis not present

## 2016-11-30 DIAGNOSIS — G4761 Periodic limb movement disorder: Secondary | ICD-10-CM | POA: Diagnosis not present

## 2016-11-30 DIAGNOSIS — I129 Hypertensive chronic kidney disease with stage 1 through stage 4 chronic kidney disease, or unspecified chronic kidney disease: Secondary | ICD-10-CM | POA: Diagnosis not present

## 2016-12-23 HISTORY — PX: COLOSTOMY: SHX63

## 2016-12-24 DIAGNOSIS — J159 Unspecified bacterial pneumonia: Secondary | ICD-10-CM | POA: Diagnosis not present

## 2016-12-28 DIAGNOSIS — J159 Unspecified bacterial pneumonia: Secondary | ICD-10-CM | POA: Diagnosis not present

## 2016-12-30 DIAGNOSIS — E1165 Type 2 diabetes mellitus with hyperglycemia: Secondary | ICD-10-CM | POA: Diagnosis not present

## 2016-12-30 DIAGNOSIS — R4182 Altered mental status, unspecified: Secondary | ICD-10-CM | POA: Diagnosis not present

## 2016-12-30 DIAGNOSIS — J188 Other pneumonia, unspecified organism: Secondary | ICD-10-CM | POA: Diagnosis not present

## 2016-12-30 DIAGNOSIS — J9801 Acute bronchospasm: Secondary | ICD-10-CM | POA: Diagnosis not present

## 2017-01-02 DIAGNOSIS — R0602 Shortness of breath: Secondary | ICD-10-CM | POA: Diagnosis not present

## 2017-01-16 DIAGNOSIS — R1011 Right upper quadrant pain: Secondary | ICD-10-CM | POA: Diagnosis not present

## 2017-01-16 DIAGNOSIS — J9601 Acute respiratory failure with hypoxia: Secondary | ICD-10-CM | POA: Diagnosis not present

## 2017-01-16 DIAGNOSIS — R112 Nausea with vomiting, unspecified: Secondary | ICD-10-CM | POA: Diagnosis not present

## 2017-01-16 DIAGNOSIS — K5289 Other specified noninfective gastroenteritis and colitis: Secondary | ICD-10-CM | POA: Diagnosis not present

## 2017-01-16 DIAGNOSIS — E43 Unspecified severe protein-calorie malnutrition: Secondary | ICD-10-CM | POA: Diagnosis not present

## 2017-01-16 DIAGNOSIS — I10 Essential (primary) hypertension: Secondary | ICD-10-CM | POA: Diagnosis not present

## 2017-01-16 DIAGNOSIS — K529 Noninfective gastroenteritis and colitis, unspecified: Secondary | ICD-10-CM | POA: Diagnosis not present

## 2017-01-16 DIAGNOSIS — N179 Acute kidney failure, unspecified: Secondary | ICD-10-CM | POA: Diagnosis not present

## 2017-01-16 DIAGNOSIS — R6521 Severe sepsis with septic shock: Secondary | ICD-10-CM | POA: Diagnosis not present

## 2017-01-16 DIAGNOSIS — R1031 Right lower quadrant pain: Secondary | ICD-10-CM | POA: Diagnosis not present

## 2017-01-16 DIAGNOSIS — Z0289 Encounter for other administrative examinations: Secondary | ICD-10-CM | POA: Diagnosis not present

## 2017-01-16 DIAGNOSIS — A09 Infectious gastroenteritis and colitis, unspecified: Secondary | ICD-10-CM | POA: Diagnosis not present

## 2017-01-16 DIAGNOSIS — R109 Unspecified abdominal pain: Secondary | ICD-10-CM | POA: Diagnosis not present

## 2017-01-16 DIAGNOSIS — A419 Sepsis, unspecified organism: Secondary | ICD-10-CM | POA: Diagnosis not present

## 2017-01-16 DIAGNOSIS — R1084 Generalized abdominal pain: Secondary | ICD-10-CM | POA: Diagnosis not present

## 2017-01-17 DIAGNOSIS — R1031 Right lower quadrant pain: Secondary | ICD-10-CM | POA: Diagnosis not present

## 2017-01-17 DIAGNOSIS — R1011 Right upper quadrant pain: Secondary | ICD-10-CM | POA: Diagnosis not present

## 2017-01-17 DIAGNOSIS — R109 Unspecified abdominal pain: Secondary | ICD-10-CM | POA: Diagnosis not present

## 2017-01-17 DIAGNOSIS — N179 Acute kidney failure, unspecified: Secondary | ICD-10-CM | POA: Diagnosis not present

## 2017-01-18 DIAGNOSIS — D62 Acute posthemorrhagic anemia: Secondary | ICD-10-CM | POA: Diagnosis not present

## 2017-01-18 DIAGNOSIS — R140 Abdominal distension (gaseous): Secondary | ICD-10-CM | POA: Diagnosis not present

## 2017-01-18 DIAGNOSIS — J9601 Acute respiratory failure with hypoxia: Secondary | ICD-10-CM | POA: Diagnosis not present

## 2017-01-18 DIAGNOSIS — E039 Hypothyroidism, unspecified: Secondary | ICD-10-CM | POA: Diagnosis present

## 2017-01-18 DIAGNOSIS — I428 Other cardiomyopathies: Secondary | ICD-10-CM | POA: Diagnosis not present

## 2017-01-18 DIAGNOSIS — K529 Noninfective gastroenteritis and colitis, unspecified: Secondary | ICD-10-CM | POA: Diagnosis not present

## 2017-01-18 DIAGNOSIS — N3000 Acute cystitis without hematuria: Secondary | ICD-10-CM | POA: Diagnosis not present

## 2017-01-18 DIAGNOSIS — E1140 Type 2 diabetes mellitus with diabetic neuropathy, unspecified: Secondary | ICD-10-CM | POA: Diagnosis present

## 2017-01-18 DIAGNOSIS — K2961 Other gastritis with bleeding: Secondary | ICD-10-CM | POA: Diagnosis not present

## 2017-01-18 DIAGNOSIS — T380X5A Adverse effect of glucocorticoids and synthetic analogues, initial encounter: Secondary | ICD-10-CM | POA: Diagnosis not present

## 2017-01-18 DIAGNOSIS — R6521 Severe sepsis with septic shock: Secondary | ICD-10-CM | POA: Diagnosis not present

## 2017-01-18 DIAGNOSIS — A419 Sepsis, unspecified organism: Secondary | ICD-10-CM | POA: Diagnosis not present

## 2017-01-18 DIAGNOSIS — E875 Hyperkalemia: Secondary | ICD-10-CM | POA: Diagnosis not present

## 2017-01-18 DIAGNOSIS — E119 Type 2 diabetes mellitus without complications: Secondary | ICD-10-CM | POA: Diagnosis not present

## 2017-01-18 DIAGNOSIS — I130 Hypertensive heart and chronic kidney disease with heart failure and stage 1 through stage 4 chronic kidney disease, or unspecified chronic kidney disease: Secondary | ICD-10-CM | POA: Diagnosis present

## 2017-01-18 DIAGNOSIS — I10 Essential (primary) hypertension: Secondary | ICD-10-CM | POA: Diagnosis not present

## 2017-01-18 DIAGNOSIS — I509 Heart failure, unspecified: Secondary | ICD-10-CM | POA: Diagnosis not present

## 2017-01-18 DIAGNOSIS — Z881 Allergy status to other antibiotic agents status: Secondary | ICD-10-CM | POA: Diagnosis not present

## 2017-01-18 DIAGNOSIS — K9409 Other complications of colostomy: Secondary | ICD-10-CM | POA: Diagnosis not present

## 2017-01-18 DIAGNOSIS — D509 Iron deficiency anemia, unspecified: Secondary | ICD-10-CM | POA: Diagnosis not present

## 2017-01-18 DIAGNOSIS — N189 Chronic kidney disease, unspecified: Secondary | ICD-10-CM | POA: Diagnosis not present

## 2017-01-18 DIAGNOSIS — N183 Chronic kidney disease, stage 3 (moderate): Secondary | ICD-10-CM | POA: Diagnosis present

## 2017-01-18 DIAGNOSIS — I5020 Unspecified systolic (congestive) heart failure: Secondary | ICD-10-CM | POA: Diagnosis present

## 2017-01-18 DIAGNOSIS — J9690 Respiratory failure, unspecified, unspecified whether with hypoxia or hypercapnia: Secondary | ICD-10-CM | POA: Diagnosis not present

## 2017-01-18 DIAGNOSIS — E785 Hyperlipidemia, unspecified: Secondary | ICD-10-CM | POA: Diagnosis not present

## 2017-01-18 DIAGNOSIS — R338 Other retention of urine: Secondary | ICD-10-CM | POA: Diagnosis not present

## 2017-01-18 DIAGNOSIS — G9340 Encephalopathy, unspecified: Secondary | ICD-10-CM | POA: Diagnosis not present

## 2017-01-18 DIAGNOSIS — E870 Hyperosmolality and hypernatremia: Secondary | ICD-10-CM | POA: Diagnosis not present

## 2017-01-18 DIAGNOSIS — Z7982 Long term (current) use of aspirin: Secondary | ICD-10-CM | POA: Diagnosis not present

## 2017-01-18 DIAGNOSIS — E1165 Type 2 diabetes mellitus with hyperglycemia: Secondary | ICD-10-CM | POA: Diagnosis not present

## 2017-01-18 DIAGNOSIS — D473 Essential (hemorrhagic) thrombocythemia: Secondary | ICD-10-CM | POA: Diagnosis not present

## 2017-01-18 DIAGNOSIS — D649 Anemia, unspecified: Secondary | ICD-10-CM | POA: Diagnosis present

## 2017-01-18 DIAGNOSIS — E889 Metabolic disorder, unspecified: Secondary | ICD-10-CM | POA: Diagnosis not present

## 2017-01-18 DIAGNOSIS — E871 Hypo-osmolality and hyponatremia: Secondary | ICD-10-CM | POA: Diagnosis not present

## 2017-01-18 DIAGNOSIS — I447 Left bundle-branch block, unspecified: Secondary | ICD-10-CM | POA: Diagnosis not present

## 2017-01-18 DIAGNOSIS — I5043 Acute on chronic combined systolic (congestive) and diastolic (congestive) heart failure: Secondary | ICD-10-CM | POA: Diagnosis not present

## 2017-01-18 DIAGNOSIS — N179 Acute kidney failure, unspecified: Secondary | ICD-10-CM | POA: Diagnosis not present

## 2017-01-18 DIAGNOSIS — J45909 Unspecified asthma, uncomplicated: Secondary | ICD-10-CM | POA: Diagnosis present

## 2017-01-18 DIAGNOSIS — Z4682 Encounter for fitting and adjustment of non-vascular catheter: Secondary | ICD-10-CM | POA: Diagnosis not present

## 2017-01-18 DIAGNOSIS — E876 Hypokalemia: Secondary | ICD-10-CM | POA: Diagnosis not present

## 2017-01-18 DIAGNOSIS — K5289 Other specified noninfective gastroenteritis and colitis: Secondary | ICD-10-CM | POA: Diagnosis present

## 2017-01-18 DIAGNOSIS — K659 Peritonitis, unspecified: Secondary | ICD-10-CM | POA: Diagnosis not present

## 2017-01-18 DIAGNOSIS — J90 Pleural effusion, not elsewhere classified: Secondary | ICD-10-CM | POA: Diagnosis not present

## 2017-01-18 DIAGNOSIS — Z452 Encounter for adjustment and management of vascular access device: Secondary | ICD-10-CM | POA: Diagnosis not present

## 2017-01-18 DIAGNOSIS — D72829 Elevated white blood cell count, unspecified: Secondary | ICD-10-CM | POA: Diagnosis not present

## 2017-01-18 DIAGNOSIS — Z794 Long term (current) use of insulin: Secondary | ICD-10-CM | POA: Diagnosis not present

## 2017-01-18 DIAGNOSIS — J810 Acute pulmonary edema: Secondary | ICD-10-CM | POA: Diagnosis not present

## 2017-01-18 DIAGNOSIS — E8770 Fluid overload, unspecified: Secondary | ICD-10-CM | POA: Diagnosis not present

## 2017-01-18 DIAGNOSIS — E079 Disorder of thyroid, unspecified: Secondary | ICD-10-CM | POA: Diagnosis not present

## 2017-01-18 DIAGNOSIS — Z95810 Presence of automatic (implantable) cardiac defibrillator: Secondary | ICD-10-CM | POA: Diagnosis not present

## 2017-01-18 DIAGNOSIS — N184 Chronic kidney disease, stage 4 (severe): Secondary | ICD-10-CM | POA: Diagnosis not present

## 2017-01-18 DIAGNOSIS — J449 Chronic obstructive pulmonary disease, unspecified: Secondary | ICD-10-CM | POA: Diagnosis present

## 2017-01-18 DIAGNOSIS — Z9049 Acquired absence of other specified parts of digestive tract: Secondary | ICD-10-CM | POA: Diagnosis not present

## 2017-01-18 DIAGNOSIS — E1122 Type 2 diabetes mellitus with diabetic chronic kidney disease: Secondary | ICD-10-CM | POA: Diagnosis present

## 2017-01-18 DIAGNOSIS — R0602 Shortness of breath: Secondary | ICD-10-CM | POA: Diagnosis not present

## 2017-01-18 DIAGNOSIS — B3749 Other urogenital candidiasis: Secondary | ICD-10-CM | POA: Diagnosis not present

## 2017-01-18 DIAGNOSIS — I5022 Chronic systolic (congestive) heart failure: Secondary | ICD-10-CM | POA: Diagnosis not present

## 2017-01-18 DIAGNOSIS — R339 Retention of urine, unspecified: Secondary | ICD-10-CM | POA: Diagnosis not present

## 2017-01-18 DIAGNOSIS — E440 Moderate protein-calorie malnutrition: Secondary | ICD-10-CM | POA: Diagnosis not present

## 2017-01-18 DIAGNOSIS — M9080 Osteopathy in diseases classified elsewhere, unspecified site: Secondary | ICD-10-CM | POA: Diagnosis not present

## 2017-01-18 DIAGNOSIS — E43 Unspecified severe protein-calorie malnutrition: Secondary | ICD-10-CM | POA: Diagnosis present

## 2017-01-18 DIAGNOSIS — K55039 Acute (reversible) ischemia of large intestine, extent unspecified: Secondary | ICD-10-CM | POA: Diagnosis not present

## 2017-01-18 DIAGNOSIS — R109 Unspecified abdominal pain: Secondary | ICD-10-CM | POA: Diagnosis not present

## 2017-01-18 DIAGNOSIS — I5031 Acute diastolic (congestive) heart failure: Secondary | ICD-10-CM | POA: Diagnosis not present

## 2017-01-18 DIAGNOSIS — Z933 Colostomy status: Secondary | ICD-10-CM | POA: Diagnosis not present

## 2017-01-18 DIAGNOSIS — M5416 Radiculopathy, lumbar region: Secondary | ICD-10-CM | POA: Diagnosis present

## 2017-01-18 DIAGNOSIS — R509 Fever, unspecified: Secondary | ICD-10-CM | POA: Diagnosis not present

## 2017-01-18 DIAGNOSIS — K922 Gastrointestinal hemorrhage, unspecified: Secondary | ICD-10-CM | POA: Diagnosis not present

## 2017-01-18 DIAGNOSIS — G4733 Obstructive sleep apnea (adult) (pediatric): Secondary | ICD-10-CM | POA: Diagnosis present

## 2017-01-26 ENCOUNTER — Inpatient Hospital Stay (HOSPITAL_COMMUNITY): Payer: Medicare Other

## 2017-01-26 ENCOUNTER — Inpatient Hospital Stay (HOSPITAL_COMMUNITY)
Admission: AD | Admit: 2017-01-26 | Discharge: 2017-02-06 | DRG: 871 | Disposition: A | Discharge status: Rehabilitation Facility | Payer: Medicare Other | Source: Other Acute Inpatient Hospital | Attending: Internal Medicine | Admitting: Internal Medicine

## 2017-01-26 DIAGNOSIS — I428 Other cardiomyopathies: Secondary | ICD-10-CM

## 2017-01-26 DIAGNOSIS — E039 Hypothyroidism, unspecified: Secondary | ICD-10-CM | POA: Diagnosis present

## 2017-01-26 DIAGNOSIS — G4733 Obstructive sleep apnea (adult) (pediatric): Secondary | ICD-10-CM | POA: Diagnosis present

## 2017-01-26 DIAGNOSIS — R6521 Severe sepsis with septic shock: Secondary | ICD-10-CM | POA: Diagnosis present

## 2017-01-26 DIAGNOSIS — E785 Hyperlipidemia, unspecified: Secondary | ICD-10-CM | POA: Diagnosis present

## 2017-01-26 DIAGNOSIS — I5043 Acute on chronic combined systolic (congestive) and diastolic (congestive) heart failure: Secondary | ICD-10-CM | POA: Diagnosis present

## 2017-01-26 DIAGNOSIS — M545 Low back pain: Secondary | ICD-10-CM | POA: Diagnosis not present

## 2017-01-26 DIAGNOSIS — M109 Gout, unspecified: Secondary | ICD-10-CM | POA: Diagnosis present

## 2017-01-26 DIAGNOSIS — E1122 Type 2 diabetes mellitus with diabetic chronic kidney disease: Secondary | ICD-10-CM | POA: Diagnosis present

## 2017-01-26 DIAGNOSIS — E1142 Type 2 diabetes mellitus with diabetic polyneuropathy: Secondary | ICD-10-CM | POA: Diagnosis present

## 2017-01-26 DIAGNOSIS — E781 Pure hyperglyceridemia: Secondary | ICD-10-CM | POA: Diagnosis present

## 2017-01-26 DIAGNOSIS — E1165 Type 2 diabetes mellitus with hyperglycemia: Secondary | ICD-10-CM | POA: Diagnosis not present

## 2017-01-26 DIAGNOSIS — I447 Left bundle-branch block, unspecified: Secondary | ICD-10-CM | POA: Diagnosis present

## 2017-01-26 DIAGNOSIS — Z8249 Family history of ischemic heart disease and other diseases of the circulatory system: Secondary | ICD-10-CM | POA: Diagnosis not present

## 2017-01-26 DIAGNOSIS — Z888 Allergy status to other drugs, medicaments and biological substances status: Secondary | ICD-10-CM

## 2017-01-26 DIAGNOSIS — R0602 Shortness of breath: Secondary | ICD-10-CM | POA: Diagnosis not present

## 2017-01-26 DIAGNOSIS — Z95810 Presence of automatic (implantable) cardiac defibrillator: Secondary | ICD-10-CM | POA: Diagnosis not present

## 2017-01-26 DIAGNOSIS — R5381 Other malaise: Secondary | ICD-10-CM | POA: Diagnosis not present

## 2017-01-26 DIAGNOSIS — K659 Peritonitis, unspecified: Secondary | ICD-10-CM | POA: Diagnosis present

## 2017-01-26 DIAGNOSIS — E079 Disorder of thyroid, unspecified: Secondary | ICD-10-CM | POA: Diagnosis present

## 2017-01-26 DIAGNOSIS — N179 Acute kidney failure, unspecified: Secondary | ICD-10-CM | POA: Diagnosis present

## 2017-01-26 DIAGNOSIS — D508 Other iron deficiency anemias: Secondary | ICD-10-CM | POA: Diagnosis not present

## 2017-01-26 DIAGNOSIS — J811 Chronic pulmonary edema: Secondary | ICD-10-CM

## 2017-01-26 DIAGNOSIS — Z79899 Other long term (current) drug therapy: Secondary | ICD-10-CM | POA: Diagnosis not present

## 2017-01-26 DIAGNOSIS — G9340 Encephalopathy, unspecified: Secondary | ICD-10-CM

## 2017-01-26 DIAGNOSIS — Z452 Encounter for adjustment and management of vascular access device: Secondary | ICD-10-CM

## 2017-01-26 DIAGNOSIS — A419 Sepsis, unspecified organism: Secondary | ICD-10-CM | POA: Diagnosis present

## 2017-01-26 DIAGNOSIS — D509 Iron deficiency anemia, unspecified: Secondary | ICD-10-CM | POA: Diagnosis present

## 2017-01-26 DIAGNOSIS — K920 Hematemesis: Secondary | ICD-10-CM | POA: Diagnosis not present

## 2017-01-26 DIAGNOSIS — E870 Hyperosmolality and hypernatremia: Secondary | ICD-10-CM | POA: Diagnosis not present

## 2017-01-26 DIAGNOSIS — E119 Type 2 diabetes mellitus without complications: Secondary | ICD-10-CM

## 2017-01-26 DIAGNOSIS — K9409 Other complications of colostomy: Secondary | ICD-10-CM | POA: Diagnosis present

## 2017-01-26 DIAGNOSIS — T380X5A Adverse effect of glucocorticoids and synthetic analogues, initial encounter: Secondary | ICD-10-CM | POA: Diagnosis not present

## 2017-01-26 DIAGNOSIS — J9601 Acute respiratory failure with hypoxia: Secondary | ICD-10-CM

## 2017-01-26 DIAGNOSIS — J449 Chronic obstructive pulmonary disease, unspecified: Secondary | ICD-10-CM | POA: Diagnosis present

## 2017-01-26 DIAGNOSIS — J810 Acute pulmonary edema: Secondary | ICD-10-CM | POA: Diagnosis not present

## 2017-01-26 DIAGNOSIS — Z90710 Acquired absence of both cervix and uterus: Secondary | ICD-10-CM | POA: Diagnosis not present

## 2017-01-26 DIAGNOSIS — N3000 Acute cystitis without hematuria: Secondary | ICD-10-CM | POA: Diagnosis not present

## 2017-01-26 DIAGNOSIS — B49 Unspecified mycosis: Secondary | ICD-10-CM | POA: Diagnosis present

## 2017-01-26 DIAGNOSIS — I5031 Acute diastolic (congestive) heart failure: Secondary | ICD-10-CM | POA: Diagnosis not present

## 2017-01-26 DIAGNOSIS — D649 Anemia, unspecified: Secondary | ICD-10-CM | POA: Diagnosis not present

## 2017-01-26 DIAGNOSIS — Z7982 Long term (current) use of aspirin: Secondary | ICD-10-CM

## 2017-01-26 DIAGNOSIS — R339 Retention of urine, unspecified: Secondary | ICD-10-CM | POA: Diagnosis not present

## 2017-01-26 DIAGNOSIS — J90 Pleural effusion, not elsewhere classified: Secondary | ICD-10-CM | POA: Diagnosis not present

## 2017-01-26 DIAGNOSIS — D62 Acute posthemorrhagic anemia: Secondary | ICD-10-CM | POA: Diagnosis not present

## 2017-01-26 DIAGNOSIS — I10 Essential (primary) hypertension: Secondary | ICD-10-CM | POA: Diagnosis not present

## 2017-01-26 DIAGNOSIS — Z4682 Encounter for fitting and adjustment of non-vascular catheter: Secondary | ICD-10-CM | POA: Diagnosis not present

## 2017-01-26 DIAGNOSIS — Z794 Long term (current) use of insulin: Secondary | ICD-10-CM

## 2017-01-26 DIAGNOSIS — Z9049 Acquired absence of other specified parts of digestive tract: Secondary | ICD-10-CM

## 2017-01-26 DIAGNOSIS — Z881 Allergy status to other antibiotic agents status: Secondary | ICD-10-CM

## 2017-01-26 DIAGNOSIS — Z9981 Dependence on supplemental oxygen: Secondary | ICD-10-CM

## 2017-01-26 DIAGNOSIS — I130 Hypertensive heart and chronic kidney disease with heart failure and stage 1 through stage 4 chronic kidney disease, or unspecified chronic kidney disease: Secondary | ICD-10-CM | POA: Diagnosis present

## 2017-01-26 DIAGNOSIS — I5022 Chronic systolic (congestive) heart failure: Secondary | ICD-10-CM | POA: Diagnosis not present

## 2017-01-26 DIAGNOSIS — F4321 Adjustment disorder with depressed mood: Secondary | ICD-10-CM | POA: Diagnosis not present

## 2017-01-26 DIAGNOSIS — Z933 Colostomy status: Secondary | ICD-10-CM | POA: Diagnosis not present

## 2017-01-26 DIAGNOSIS — Z9289 Personal history of other medical treatment: Secondary | ICD-10-CM

## 2017-01-26 DIAGNOSIS — L8990 Pressure ulcer of unspecified site, unspecified stage: Secondary | ICD-10-CM | POA: Insufficient documentation

## 2017-01-26 DIAGNOSIS — K529 Noninfective gastroenteritis and colitis, unspecified: Secondary | ICD-10-CM | POA: Diagnosis not present

## 2017-01-26 DIAGNOSIS — N183 Chronic kidney disease, stage 3 (moderate): Secondary | ICD-10-CM | POA: Diagnosis not present

## 2017-01-26 DIAGNOSIS — E871 Hypo-osmolality and hyponatremia: Secondary | ICD-10-CM | POA: Diagnosis not present

## 2017-01-26 DIAGNOSIS — R109 Unspecified abdominal pain: Secondary | ICD-10-CM | POA: Diagnosis not present

## 2017-01-26 DIAGNOSIS — K921 Melena: Secondary | ICD-10-CM | POA: Diagnosis not present

## 2017-01-26 DIAGNOSIS — T560X1D Toxic effect of lead and its compounds, accidental (unintentional), subsequent encounter: Secondary | ICD-10-CM | POA: Diagnosis not present

## 2017-01-26 DIAGNOSIS — B3749 Other urogenital candidiasis: Secondary | ICD-10-CM | POA: Diagnosis present

## 2017-01-26 DIAGNOSIS — E1121 Type 2 diabetes mellitus with diabetic nephropathy: Secondary | ICD-10-CM | POA: Diagnosis not present

## 2017-01-26 DIAGNOSIS — R338 Other retention of urine: Secondary | ICD-10-CM | POA: Diagnosis not present

## 2017-01-26 DIAGNOSIS — M10171 Lead-induced gout, right ankle and foot: Secondary | ICD-10-CM | POA: Diagnosis not present

## 2017-01-26 DIAGNOSIS — Z6839 Body mass index (BMI) 39.0-39.9, adult: Secondary | ICD-10-CM

## 2017-01-26 DIAGNOSIS — G8929 Other chronic pain: Secondary | ICD-10-CM | POA: Diagnosis not present

## 2017-01-26 DIAGNOSIS — R531 Weakness: Secondary | ICD-10-CM | POA: Diagnosis present

## 2017-01-26 DIAGNOSIS — K55039 Acute (reversible) ischemia of large intestine, extent unspecified: Secondary | ICD-10-CM | POA: Diagnosis present

## 2017-01-26 DIAGNOSIS — I5032 Chronic diastolic (congestive) heart failure: Secondary | ICD-10-CM | POA: Diagnosis present

## 2017-01-26 DIAGNOSIS — K922 Gastrointestinal hemorrhage, unspecified: Secondary | ICD-10-CM | POA: Diagnosis not present

## 2017-01-26 DIAGNOSIS — K2961 Other gastritis with bleeding: Secondary | ICD-10-CM | POA: Diagnosis not present

## 2017-01-26 DIAGNOSIS — E875 Hyperkalemia: Secondary | ICD-10-CM | POA: Diagnosis not present

## 2017-01-26 DIAGNOSIS — J9690 Respiratory failure, unspecified, unspecified whether with hypoxia or hypercapnia: Secondary | ICD-10-CM | POA: Diagnosis not present

## 2017-01-26 DIAGNOSIS — D473 Essential (hemorrhagic) thrombocythemia: Secondary | ICD-10-CM | POA: Diagnosis not present

## 2017-01-26 DIAGNOSIS — Z833 Family history of diabetes mellitus: Secondary | ICD-10-CM

## 2017-01-26 DIAGNOSIS — I1310 Hypertensive heart and chronic kidney disease without heart failure, with stage 1 through stage 4 chronic kidney disease, or unspecified chronic kidney disease: Secondary | ICD-10-CM | POA: Diagnosis not present

## 2017-01-26 DIAGNOSIS — N189 Chronic kidney disease, unspecified: Secondary | ICD-10-CM | POA: Diagnosis present

## 2017-01-26 DIAGNOSIS — N390 Urinary tract infection, site not specified: Secondary | ICD-10-CM | POA: Diagnosis present

## 2017-01-26 LAB — COMPREHENSIVE METABOLIC PANEL
ALBUMIN: 3.6 g/dL (ref 3.5–5.0)
ALT: 13 U/L — ABNORMAL LOW (ref 14–54)
ANION GAP: 13 (ref 5–15)
AST: 25 U/L (ref 15–41)
Alkaline Phosphatase: 133 U/L — ABNORMAL HIGH (ref 38–126)
BUN: 60 mg/dL — AB (ref 6–20)
CHLORIDE: 98 mmol/L — AB (ref 101–111)
CO2: 26 mmol/L (ref 22–32)
Calcium: 9.3 mg/dL (ref 8.9–10.3)
Creatinine, Ser: 2.17 mg/dL — ABNORMAL HIGH (ref 0.44–1.00)
GFR calc Af Amer: 26 mL/min — ABNORMAL LOW (ref >60)
GFR calc non Af Amer: 22 mL/min — ABNORMAL LOW (ref >60)
GLUCOSE: 303 mg/dL — AB (ref 65–99)
POTASSIUM: 4.1 mmol/L (ref 3.5–5.1)
SODIUM: 137 mmol/L (ref 135–145)
Total Bilirubin: 0.9 mg/dL (ref 0.3–1.2)
Total Protein: 6.6 g/dL (ref 6.5–8.1)

## 2017-01-26 LAB — CBC WITH DIFFERENTIAL/PLATELET
BASOS ABS: 0 10*3/uL (ref 0.0–0.1)
Basophils Relative: 0 %
Eosinophils Absolute: 0.3 10*3/uL (ref 0.0–0.7)
Eosinophils Relative: 2 %
HCT: 26.2 % — ABNORMAL LOW (ref 36.0–46.0)
Hemoglobin: 8.5 g/dL — ABNORMAL LOW (ref 12.0–15.0)
Lymphocytes Relative: 11 %
Lymphs Abs: 1.7 10*3/uL (ref 0.7–4.0)
MCH: 28.2 pg (ref 26.0–34.0)
MCHC: 32.4 g/dL (ref 30.0–36.0)
MCV: 87 fL (ref 78.0–100.0)
MONO ABS: 1.4 10*3/uL — AB (ref 0.1–1.0)
Monocytes Relative: 9 %
NEUTROS ABS: 12.3 10*3/uL — AB (ref 1.7–7.7)
Neutrophils Relative %: 78 %
PLATELETS: 330 10*3/uL (ref 150–400)
RBC: 3.01 MIL/uL — AB (ref 3.87–5.11)
RDW: 17.3 % — AB (ref 11.5–15.5)
WBC: 15.7 10*3/uL — AB (ref 4.0–10.5)

## 2017-01-26 LAB — PROTIME-INR
INR: 1.17
Prothrombin Time: 15 seconds (ref 11.4–15.2)

## 2017-01-26 LAB — GLUCOSE, CAPILLARY
GLUCOSE-CAPILLARY: 336 mg/dL — AB (ref 65–99)
GLUCOSE-CAPILLARY: 316 mg/dL — AB (ref 65–99)
Glucose-Capillary: 292 mg/dL — ABNORMAL HIGH (ref 65–99)
Glucose-Capillary: 295 mg/dL — ABNORMAL HIGH (ref 65–99)
Glucose-Capillary: 303 mg/dL — ABNORMAL HIGH (ref 65–99)
Glucose-Capillary: 310 mg/dL — ABNORMAL HIGH (ref 65–99)
Glucose-Capillary: 347 mg/dL — ABNORMAL HIGH (ref 65–99)
Glucose-Capillary: 349 mg/dL — ABNORMAL HIGH (ref 65–99)

## 2017-01-26 LAB — MAGNESIUM: Magnesium: 1.9 mg/dL (ref 1.7–2.4)

## 2017-01-26 LAB — TROPONIN I

## 2017-01-26 LAB — BLOOD GAS, ARTERIAL
Acid-Base Excess: 3.2 mmol/L — ABNORMAL HIGH (ref 0.0–2.0)
Bicarbonate: 27.6 mmol/L (ref 20.0–28.0)
DRAWN BY: 441371
FIO2: 60
O2 Saturation: 96.3 %
PATIENT TEMPERATURE: 101.1
PCO2 ART: 48.4 mmHg — AB (ref 32.0–48.0)
PEEP: 5 cmH2O
PH ART: 7.382 (ref 7.350–7.450)
RATE: 20 resp/min
VT: 500 mL
pO2, Arterial: 93.3 mmHg (ref 83.0–108.0)

## 2017-01-26 LAB — PROCALCITONIN: PROCALCITONIN: 2.44 ng/mL

## 2017-01-26 LAB — TYPE AND SCREEN
ABO/RH(D): B POS
Antibody Screen: NEGATIVE

## 2017-01-26 LAB — LACTIC ACID, PLASMA: LACTIC ACID, VENOUS: 1.7 mmol/L (ref 0.5–1.9)

## 2017-01-26 LAB — LIPASE, BLOOD: LIPASE: 37 U/L (ref 11–51)

## 2017-01-26 LAB — PHOSPHORUS: Phosphorus: 3.6 mg/dL (ref 2.5–4.6)

## 2017-01-26 LAB — ABO/RH: ABO/RH(D): B POS

## 2017-01-26 LAB — AMYLASE: AMYLASE: 20 U/L — AB (ref 28–100)

## 2017-01-26 LAB — MRSA PCR SCREENING: MRSA BY PCR: NEGATIVE

## 2017-01-26 LAB — TRIGLYCERIDES: TRIGLYCERIDES: 691 mg/dL — AB (ref <150)

## 2017-01-26 MED ORDER — PROPOFOL 1000 MG/100ML IV EMUL
0.0000 ug/kg/min | INTRAVENOUS | Status: DC
  Administered 2017-01-26: 20 ug/kg/min via INTRAVENOUS
  Filled 2017-01-26: qty 100

## 2017-01-26 MED ORDER — VANCOMYCIN HCL IN DEXTROSE 1-5 GM/200ML-% IV SOLN
1000.0000 mg | INTRAVENOUS | Status: DC
  Administered 2017-01-26: 1000 mg via INTRAVENOUS
  Filled 2017-01-26 (×2): qty 200

## 2017-01-26 MED ORDER — FENTANYL BOLUS VIA INFUSION
25.0000 ug | INTRAVENOUS | Status: DC | PRN
  Administered 2017-01-29: 25 ug via INTRAVENOUS
  Filled 2017-01-26: qty 25

## 2017-01-26 MED ORDER — ORAL CARE MOUTH RINSE
15.0000 mL | OROMUCOSAL | Status: DC
  Administered 2017-01-26 – 2017-01-29 (×24): 15 mL via OROMUCOSAL

## 2017-01-26 MED ORDER — ACETAMINOPHEN 160 MG/5ML PO SOLN
650.0000 mg | ORAL | Status: DC | PRN
  Administered 2017-01-26 – 2017-01-29 (×3): 650 mg
  Filled 2017-01-26 (×3): qty 20.3

## 2017-01-26 MED ORDER — MIDAZOLAM HCL 2 MG/2ML IJ SOLN: 1.0000 mg | INTRAMUSCULAR | Status: DC | PRN

## 2017-01-26 MED ORDER — PANTOPRAZOLE SODIUM 40 MG IV SOLR
40.0000 mg | Freq: Every day | INTRAVENOUS | Status: DC
  Administered 2017-01-26 – 2017-01-30 (×5): 40 mg via INTRAVENOUS
  Filled 2017-01-26 (×5): qty 40

## 2017-01-26 MED ORDER — SODIUM CHLORIDE 0.9 % IV SOLN
INTRAVENOUS | Status: AC
  Administered 2017-01-26: 19.4 [IU]/h via INTRAVENOUS
  Administered 2017-01-26: 16:00:00 via INTRAVENOUS
  Administered 2017-01-27: 23.2 [IU]/h via INTRAVENOUS
  Administered 2017-01-27: 18 [IU]/h via INTRAVENOUS
  Administered 2017-01-28: 16.5 [IU]/h via INTRAVENOUS
  Administered 2017-01-28: 14.6 [IU]/h via INTRAVENOUS
  Filled 2017-01-26 (×8): qty 1

## 2017-01-26 MED ORDER — CHLORHEXIDINE GLUCONATE 0.12% ORAL RINSE (MEDLINE KIT)
15.0000 mL | Freq: Two times a day (BID) | OROMUCOSAL | Status: DC
  Administered 2017-01-26 – 2017-01-30 (×8): 15 mL via OROMUCOSAL

## 2017-01-26 MED ORDER — FENTANYL 2500MCG IN NS 250ML (10MCG/ML) PREMIX INFUSION
25.0000 ug/h | INTRAVENOUS | Status: DC
  Administered 2017-01-26: 100 ug/h via INTRAVENOUS
  Filled 2017-01-26: qty 250

## 2017-01-26 MED ORDER — NOREPINEPHRINE BITARTRATE 1 MG/ML IV SOLN
0.0000 ug/min | INTRAVENOUS | Status: DC
  Administered 2017-01-26: 2 ug/min via INTRAVENOUS
  Filled 2017-01-26 (×2): qty 4

## 2017-01-26 MED ORDER — METRONIDAZOLE IN NACL 5-0.79 MG/ML-% IV SOLN: 500.0000 mg | Freq: Three times a day (TID) | INTRAVENOUS | Status: DC

## 2017-01-26 MED ORDER — TRACE MINERALS CR-CU-MN-SE-ZN 10-1000-500-60 MCG/ML IV SOLN
INTRAVENOUS | Status: AC
  Administered 2017-01-26: 21:00:00 via INTRAVENOUS
  Filled 2017-01-26: qty 1992

## 2017-01-26 MED ORDER — POTASSIUM CHLORIDE 20 MEQ/15ML (10%) PO SOLN
40.0000 meq | Freq: Three times a day (TID) | ORAL | Status: AC
  Administered 2017-01-26 (×2): 40 meq
  Filled 2017-01-26 (×2): qty 30

## 2017-01-26 MED ORDER — PIPERACILLIN-TAZOBACTAM 3.375 G IVPB
3.3750 g | Freq: Three times a day (TID) | INTRAVENOUS | Status: DC
  Administered 2017-01-26 – 2017-01-28 (×6): 3.375 g via INTRAVENOUS
  Filled 2017-01-26 (×7): qty 50

## 2017-01-26 MED ORDER — FENTANYL 2500MCG IN NS 250ML (10MCG/ML) PREMIX INFUSION: 25.0000 ug/h | INTRAVENOUS | Status: DC

## 2017-01-26 MED ORDER — FENTANYL CITRATE (PF) 100 MCG/2ML IJ SOLN: 50.0000 ug | Freq: Once | INTRAMUSCULAR | Status: DC

## 2017-01-26 MED ORDER — HEPARIN SODIUM (PORCINE) 5000 UNIT/ML IJ SOLN
5000.0000 [IU] | Freq: Three times a day (TID) | INTRAMUSCULAR | Status: DC
  Administered 2017-01-26 – 2017-02-02 (×21): 5000 [IU] via SUBCUTANEOUS
  Filled 2017-01-26 (×23): qty 1

## 2017-01-26 MED ORDER — FUROSEMIDE 10 MG/ML IJ SOLN
40.0000 mg | Freq: Four times a day (QID) | INTRAMUSCULAR | Status: AC
  Administered 2017-01-26 – 2017-01-27 (×3): 40 mg via INTRAVENOUS
  Filled 2017-01-26 (×3): qty 4

## 2017-01-26 MED ORDER — SODIUM CHLORIDE 0.9 % IV SOLN: 250.0000 mL | INTRAVENOUS | Status: DC | PRN

## 2017-01-26 NOTE — Progress Notes (Signed)
Pharmacy Antibiotic Note  Crystal [name] is a 67 y.o. 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 She had an ex lap and resection of the distal transverse colon, descending colon and sigmoid colon. Based on labs from outside hospital, her SCr is slightly up today, SCr 1.7, eCrCl 35-40 ml/min. Her last dose of vancomycin was 1500 mg at 1500 yesterday, last dose of zosyn was this morning.   Plan: -Vancomycin 1g/24h -Zosyn 3.375 g IV q8h -Monitor renal fx, cultures, VR as indicated    No data recorded.  No results for input(s): WBC, CREATININE, LATICACIDVEN, VANCOTROUGH, VANCOPEAK, VANCORANDOM, GENTTROUGH, GENTPEAK, GENTRANDOM, TOBRATROUGH, TOBRAPEAK, TOBRARND, AMIKACINPEAK, AMIKACINTROU, AMIKACIN in the last 168 hours.  CrCl cannot be calculated (No order found.).    Allergies  Allergen Reactions  . Glucophage [Metformin Hcl] Other (See Comments)    Renal failure  . Ace Inhibitors Swelling and Other (See Comments)    Angioedema  . Advicor [Niacin-Lovastatin Er] Other (See Comments)    Muscle aches  . Bystolic [Nebivolol Hcl] Other (See Comments)    Edema  . Erythromycin Diarrhea, Nausea And Vomiting and Other (See Comments)    *DERIVATIVES*  . Lipitor [Atorvastatin] Other (See Comments)    Muscle aches  . Lopid [Gemfibrozil] Other (See Comments)    Muscle aches  . Statins Other (See Comments)    Muscle aches  . Adhesive [Tape] Rash    Antimicrobials this admission: 7/5 vancomycin > 7/5 zosyn >  Dose adjustments this admission: N/A  Microbiology results: 7/5 blood cx: 7/5 resp cx: 7/5 urine cx:    Harvel Quale 01/26/2017 2:25 PM

## 2017-01-26 NOTE — Consult Note (Signed)
[facility] Surgery Consult/Admission Note  Crystal [name] [birth year]  [id number].    Requesting MD: Dr. Nelda Marseille Chief Complaint/Reason for Consult: midline open incision and ostomy care  HPI:   Patient is a 66 year old female with a history of systolic CHF S/T defibrillator, diabetes, HTN, gout, lumbar radiculopathy, mild asthma, hypothyroidism who presented to [facility] hospital on 01/16/2017 with complaints of abdominal pain. CT showed markedly distended colon. Patient went to the OR on June 27 for exploratory laparotomy and resection of the distal transverse colon, splenic flexure, descending colon and sigmoid colon. Transverse colostomy was done. Patient remained intubated. Patient was in septic shock. Patient was started on TPN. Repeat abdominal CT on 02/20/17 showed mild free abdominal and pelvic fluid without evidence of abscess. Patient was transferred to [facility] cone at the request of family. We have been consulted for wound care. Patient is currently intubated and sedated and all information in the history of present illness was taken from her medical records from [place].  ROS:  Review of Systems  Unable to perform ROS: Intubated     Family History  Problem Relation Age of Onset  . Hypertension Father   . Heart disease Father   . Cancer Father        prostate  . Heart disease Other        (Maternal side) Ischemic heart disease  . Diabetes Mellitus II Other   . Arrhythmia Mother   . Diabetes Mellitus II Mother        Borderline DM  . Hypertension Mother     Past Medical History:  Diagnosis Date  . Anemia, iron deficiency   . Angioedema    felt to likely be due to ace inhibitors but says she has had this even off of medicines, appears to be tolerating ARBs chronically  . Automatic implantable cardiac defibrillator in situ    high RV threshold chronically, device was turned off in 2014  . Back pain   . CHF (congestive heart failure) ([place])   . Chronic renal  insufficiency   . Diabetes mellitus, type 2 ([place])   . Gout   . Hyperlipidemia   . Hypertension   . Intervertebral disc degeneration   . LBBB (left bundle branch block)   . Nonischemic cardiomyopathy ([place])    s/p ICD in 2005. EF has since recovered  . Obesity   . Systemic hypertension   . Unspecified hypothyroidism     Past Surgical History:  Procedure Laterality Date  . CARDIAC CATHETERIZATION  2005   no obstructive CAD per patient  . CARDIAC DEFIBRILLATOR PLACEMENT  2005   BiV ICD implanted,  LV lead is an epicardial lead  . CYST REMOVAL HAND Right 05/2013   thumb  . IMPLANTABLE CARDIOVERTER DEFIBRILLATOR GENERATOR CHANGE  2008  . TOTAL ABDOMINAL HYSTERECTOMY    . TRIGGER FINGER RELEASE  05/213  . VESICOVAGINAL FISTULA CLOSURE W/ TAH      Social History:  reports that she has never smoked. She has never used smokeless tobacco. She reports that she does not drink alcohol or use drugs.  Allergies:  Allergies  Allergen Reactions  . Glucophage [Metformin Hcl] Other (See Comments)    Renal failure  . Ace Inhibitors Swelling and Other (See Comments)    Angioedema  . Advicor [Niacin-Lovastatin Er] Other (See Comments)    Muscle aches  . Bystolic [Nebivolol Hcl] Other (See Comments)    Edema  . Erythromycin Diarrhea, Nausea And Vomiting and Other (See Comments)    *  DERIVATIVES*  . Lipitor [Atorvastatin] Other (See Comments)    Muscle aches  . Lopid [Gemfibrozil] Other (See Comments)    Muscle aches  . Statins Other (See Comments)    Muscle aches  . Adhesive [Tape] Rash    Medications Prior to Admission  Medication Sig Dispense Refill  . aspirin 81 MG tablet Take 81 mg by mouth daily.    . bumetanide (BUMEX) 1 MG tablet Take 1 mg by mouth 2 (two) times daily.    . carvedilol (COREG) 12.5 MG tablet Take 12.5 mg by mouth 2 (two) times daily with a meal.     . CRESTOR 10 MG tablet Take 10 mg by mouth at bedtime.     . gabapentin (NEURONTIN) 100 MG capsule Take 100-400  mg by mouth See admin instructions. Take 100 mg by mouth in the morning and take 400 mg by mouth at bedtime    . Insulin Glargine (TOUJEO SOLOSTAR) 300 UNIT/ML SOPN Inject 100 Units into the skin daily.     . insulin glulisine (APIDRA) 100 UNIT/ML injection Inject 20 Units into the skin 3 (three) times daily with meals.     Marland Kitchen levothyroxine (SYNTHROID, LEVOTHROID) 88 MCG tablet Take 88 mcg by mouth daily.     Marland Kitchen linagliptin (TRADJENTA) 5 MG TABS tablet Take 5 mg by mouth daily.    Marland Kitchen LINZESS 145 MCG CAPS capsule Take 145 mcg by mouth daily.     Marland Kitchen loratadine (CLARITIN) 10 MG tablet Take 10 mg by mouth daily.    . Magnesium 400 MG TABS Take 400 mg by mouth 2 (two) times daily.     . montelukast (SINGULAIR) 10 MG tablet Take 10 mg by mouth at bedtime.    Marland Kitchen omega-3 acid ethyl esters (LOVAZA) 1 g capsule Take 4 g by mouth daily.    . temazepam (RESTORIL) 15 MG capsule Take 15 mg by mouth at bedtime as needed for sleep.     Marland Kitchen ULORIC 40 MG tablet Take 40 mg by mouth daily.     . valsartan (DIOVAN) 160 MG tablet Take 160 mg by mouth daily.     . Vitamin D, Ergocalciferol, (DRISDOL) 50000 units CAPS capsule Take 50,000 Units by mouth once a week.   3  . acetaminophen-codeine (TYLENOL #3) 300-30 MG tablet Take 1 tablet by mouth 2 (two) times daily as needed for moderate pain.     . furosemide (LASIX) 20 MG tablet Take 1 tablet (20 mg total) by mouth daily. (Patient not taking: Reported on 01/26/2017) 90 tablet 3    Blood pressure (!) 98/53, pulse (!) 101, temperature 100.3 F (37.9 C), temperature source Oral, resp. rate (!) 21, height 5\' 5"  (1.651 m), weight 236 lb 12.4 oz (107.4 kg).  Physical Exam  Constitutional: She is intubated.  Obese white female, intubated and sedated  HENT:  Head: Normocephalic and atraumatic.  Cardiovascular: Regular rhythm, S1 normal and S2 normal.  Tachycardia present.   Pulmonary/Chest: She is intubated.  Course breath sounds heard throughout all lung fields no crackles,  wheezes or rales appreciated  Abdominal: She exhibits distension (Markedly). Bowel sounds are hypoactive.  Midline abdominal incision is partially open with intermittent staples, good base of granulation tissue is noted, no purulent drainage.  Ostomy with dark dusty tissue surrounding it, small amount of liquid stool in the bag, large bullae around the ostomy on the skin are purple in color  See photos below  Musculoskeletal: She exhibits edema (Mild edema BLE). She exhibits  no deformity.  Neurological:  Unable to assess as patient is intubated and sedated  Skin: Skin is warm and dry.  Nursing note and vitals reviewed.        Results for orders placed or performed during the hospital encounter of 01/26/17 (from the past 48 hour(s))  Blood gas, arterial     Status: Abnormal   Collection Time: 01/26/17  3:05 PM  Result Value Ref Range   FIO2 60.00    Delivery systems VENTILATOR    Mode PRESSURE REGULATED VOLUME CONTROL    VT 500 mL   LHR 20 resp/min   Peep/cpap 5.0 cm H20   pH, Arterial 7.382 7.350 - 7.450   pCO2 arterial 48.4 (H) 32.0 - 48.0 mmHg   pO2, Arterial 93.3 83.0 - 108.0 mmHg   Bicarbonate 27.6 20.0 - 28.0 mmol/L   Acid-Base Excess 3.2 (H) 0.0 - 2.0 mmol/L   O2 Saturation 96.3 %   Patient temperature 101.1    Collection site RIGHT RADIAL    Drawn by [id number]    Sample type ARTERIAL DRAW    Allens test (pass/fail) PASS PASS  Glucose, capillary     Status: Abnormal   Collection Time: 01/26/17  3:17 PM  Result Value Ref Range   Glucose-Capillary 292 (H) 65 - 99 mg/dL   Comment 1 Capillary Specimen    Comment 2 Notify RN    Dg Chest Port 1 View  Result Date: 01/26/2017 CLINICAL DATA:  Endotracheal tube placement EXAM: PORTABLE CHEST 1 VIEW COMPARISON:  01/30/2014 FINDINGS: Endotracheal tube with tip at the clavicular heads. An orogastric tube reaches the stomach. Chronic cardiopericardial enlargement. Vascular pedicle widening. These changes are progressed from prior  in the setting of semi erect position. Two right-sided transvenous pacer leads and a left-sided direct epicardial pacer. Lead positioning is similar to prior. There is pulmonary edema and likely small pleural effusions. No air bronchogram. IMPRESSION: 1. Unremarkable positioning of endotracheal and orogastric tubes. 2. Layering pleural effusions and probable pulmonary edema. Chronic cardiomegaly. 3. Low volume chest. Electronically Signed   By: Monte Fantasia M.D.   On: 01/26/2017 15:16      Assessment/Plan  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 Matt Holmes, 01/18/17 [facility] - Open midline incision: Wet-to-dry dressing changes - Ostomy: We will consult [place] - Patient appears markedly distended, there is output in ostomy bag  We will continue to follow this patient. Thank you for the consult.  Kalman Drape, [facility] Surgery 01/26/2017, 3:30 PM Pager: [phone] Consults: [phone] Mon-Fri 7:00 am-4:30 pm Sat-Sun 7:00 am-11:30 am

## 2017-01-26 NOTE — H&P (Signed)
PULMONARY / CRITICAL CARE MEDICINE   Name: Crystal [name] MRN: [id number] DOB: 11-14-1950    ADMISSION DATE:  01/26/2017 (admit to [facility] 6/27) CONSULTATION DATE:  7/5  REFERRING MD:  Dr. Jens Som  CHIEF COMPLAINT:  Septic shock, colitis, VDRF  HISTORY OF PRESENT ILLNESS:  Patient is encephalopathic and/or intubated. Therefore history has been obtained from chart review. 66 year old female with PMH as below, which is significant for CHF with defibrillator in place, CKD, DM, OSA on CPAP, COPD, and Hypothyroidism. She was admitted to [facility] with severe RLQ pain on 6/25. Upon admission CT showed distended colon. She was presumed to have colitis and was admitted to the hospitalists. The following day she became more lethargic and hypotensive. She was moved to CCU and general surgery became involved. 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 Colostomy placed and abdomen left open. Post-operatively she was shocky despite 4-5L of IVF. She was started on pressors. Renal function worsened and she was diuresed with lasix drip putting out about 12-30L. At that point she was felt to be improving. Pressors able to come off. She was tolerating WUA. Family requested transfer to [facility] 7/5 when it was felt that the patient was not progressing as expected.   PAST MEDICAL HISTORY :  She  has a past medical history of Anemia, iron deficiency; Angioedema; Automatic implantable cardiac defibrillator in situ; Back pain; CHF (congestive heart failure) ([place]); Chronic renal insufficiency; Diabetes mellitus, type 2 ([place]); Gout; Hyperlipidemia; Hypertension; Intervertebral disc degeneration; LBBB (left bundle branch block); Nonischemic cardiomyopathy ([place]); Obesity; Systemic hypertension; and Unspecified hypothyroidism.  PAST SURGICAL HISTORY: She  has a past surgical history that includes Vesicovaginal  fistula closure w/ TAH; Cardiac catheterization (2005); Cyst removal hand (Right, 05/2013); Trigger finger release (05/213); Cardiac defibrillator placement (2005); Implantable cardioverter defibrillator generator change (2008); and Total abdominal hysterectomy.  Allergies  Allergen Reactions  . Glucophage [Metformin Hcl] Other (See Comments)    Renal failure  . Ace Inhibitors Swelling and Other (See Comments)    Angioedema  . Advicor [Niacin-Lovastatin Er] Other (See Comments)    Muscle aches  . Bystolic [Nebivolol Hcl] Other (See Comments)    Edema  . Erythromycin Diarrhea and Nausea And Vomiting    *DERIVATIVES*  . Lipitor [Atorvastatin] Other (See Comments)    Muscle aches  . Lopid [Gemfibrozil] Other (See Comments)    Muscle aches  . Statins     Muscle aches    No current facility-administered medications on file prior to encounter.    Current Outpatient Prescriptions on File Prior to Encounter  Medication Sig  . acetaminophen-codeine (TYLENOL #3) 300-30 MG tablet Take 1 tablet by mouth 2 (two) times daily as needed.  Marland Kitchen aspirin 81 MG tablet Take 81 mg by mouth daily.  . carvedilol (COREG) 25 MG tablet Take 25 mg by mouth 2 (two) times daily with a meal.  . CRESTOR 5 MG tablet Take 1 tablet by mouth at bedtime.  . furosemide (LASIX) 20 MG tablet Take 1 tablet (20 mg total) by mouth daily.  . Insulin Glargine (TOUJEO SOLOSTAR [place]) Inject 100 Units into the skin as directed.   . insulin glulisine (APIDRA) 100 UNIT/ML injection Inject 20 Units into the skin 3 (three) times daily with meals.   Marland Kitchen levothyroxine (SYNTHROID, LEVOTHROID) 88 MCG tablet Take 1 tablet by mouth daily.  Marland Kitchen linagliptin (TRADJENTA) 5 MG TABS  tablet Take 5 mg by mouth daily.  Marland Kitchen LINZESS 145 MCG CAPS capsule Take 1 capsule by mouth daily.  Marland Kitchen loratadine (CLARITIN) 10 MG tablet Take 10 mg by mouth daily.  . Magnesium 250 MG TABS Take 1 tablet by mouth 2 (two) times daily.  . montelukast (SINGULAIR) 10 MG tablet  Take 10 mg by mouth at bedtime.  . Omega-3 Fatty Acids (FISH OIL) 1000 MG CAPS Take 1 capsule by mouth 3 (three) times daily.  . ONE TOUCH ULTRA TEST test strip As directed  . temazepam (RESTORIL) 15 MG capsule Take 15 mg by mouth at bedtime.  Marland Kitchen ULORIC 40 MG tablet Take 1 tablet by mouth daily.  . valsartan (DIOVAN) 160 MG tablet Take 160 mg by mouth daily.  . Vitamin D, Ergocalciferol, (DRISDOL) 50000 units CAPS capsule Take 1 capsule by mouth once a week.    FAMILY HISTORY:  Her indicated that the status of her mother is unknown. She indicated that her father is deceased. She indicated that her other is alive.    SOCIAL HISTORY: She  reports that she has never smoked. She has never used smokeless tobacco. She reports that she does not drink alcohol or use drugs.  REVIEW OF SYSTEMS:   Unable as patient is encephalopathic and intubated  SUBJECTIVE:   VITAL SIGNS: There were no vitals taken for this visit.  HEMODYNAMICS:    VENTILATOR SETTINGS:    INTAKE / OUTPUT: No intake/output data recorded.  PHYSICAL EXAMINATION: General:  Morbidly obese female in NAD on vent Neuro:  Sedated HEENT:  [place]/AT, PERRL, no JVD Cardiovascular:  RRR, no MRG Lungs:  Coarse bilateral Abdomen:  Open abdomen with dressing in place. Colostomy RLQ beefy red. Draining appro Musculoskeletal:  No acute deformity or ROM limitation.  Skin:  Grossly intact  LABS:  BMET No results for input(s): NA, K, CL, CO2, BUN, CREATININE, GLUCOSE in the last 168 hours.  Electrolytes No results for input(s): CALCIUM, MG, PHOS in the last 168 hours.  CBC No results for input(s): WBC, HGB, HCT, PLT in the last 168 hours.  Coag's No results for input(s): APTT, INR in the last 168 hours.  Sepsis Markers No results for input(s): LATICACIDVEN, PROCALCITON, O2SATVEN in the last 168 hours.  ABG No results for input(s): PHART, PCO2ART, PO2ART in the last 168 hours.  Liver Enzymes No results for input(s):  AST, ALT, ALKPHOS, BILITOT, ALBUMIN in the last 168 hours.  Cardiac Enzymes No results for input(s): TROPONINI, PROBNP in the last 168 hours.  Glucose No results for input(s): GLUCAP in the last 168 hours.  Imaging No results found.   STUDIES:  CT abdomen 6/25 > Markedly distended colon, without small bowel dilation. Colonic diverticulosis without diverticulitis or colitis.  CT chest/abd/pelvis 7/3 > Echo 6/28 > LVEF 55 %  CULTURES: Blood 7/5 > Urine  7/5 > Tracheal asp 7/5 >  ANTIBIOTICS: Vancomycin 6/27 > Zosyn 6/27 > Flagyl 6/27 >  SIGNIFICANT EVENTS: 6/25 admit for RLQ pain > 6/27 to OR for colitis >  LINES/TUBES: LUE PICC pre-hosp> ETT 6/27 > Foley pre-hosp>  DISCUSSION: 66 year old female with extensive PMH who presented to [facility] for abdominal wall who was found to have severe colitis and was taken to the OR and left open.  After a week, patient made little to no progress and family requited transfer to [facility] for further care.  ASSESSMENT / PLAN:  PULMONARY A: Acute respiratory failure post op P:   - Continue full  vent support. - ABG and CXR now. - Adjust vent for ABG. - Titrate O2 for sat of 88-92% - Hold weaning while abdomen is open.  CARDIOVASCULAR A:  Periodic hypotension that now is resolved CHF P:  - Tele monitoring - Follow CVP - Hold further hydration, if hypotensive will use pressors.  RENAL A:   Acute renal failure P:   - Follow CVP for volume status. - BMET in AM - Replace electrolytes as indicated - Lasix 40 mg IV q6 x3 doses. - 2 doses of KCl 40 meq ordered  GASTROINTESTINAL A:   Colitis Open abdomen P:   - General surgery consult called - NPO - Abx as below.  HEMATOLOGIC A:   Leukocytosis P:  - CBC in AM - Transfuse per ICU protocol  INFECTIOUS A:   Intra-abdominal infection P:   - Cipro - Flagyl - Vanc - Reculture  ENDOCRINE A:   DM   P:   - CBGs - Insulin drip  NEUROLOGIC A:    Sedation P:   RASS goal: 0 - Fentanyl drip - PRN versed - D/C propofol  FAMILY  - Updates: Family updated bedside  - Inter-disciplinary family meet or Palliative Care meeting due by:  day 7  The patient is critically ill with multiple organ systems failure and requires high complexity decision making for assessment and support, frequent evaluation and titration of therapies, application of advanced monitoring technologies and extensive interpretation of multiple databases.   Critical Care Time devoted to patient care services described in this note is  60  Minutes. This time reflects time of care of this signee Dr Jennet Maduro. This critical care time does not reflect procedure time, or teaching time or supervisory time of PA/NP/Med student/Med Resident etc but could involve care discussion time.  Rush Farmer, M.D. [facility] Pulmonary/Critical Care Medicine. Pager: [phone]. After hours pager: [phone].  01/26/2017, 2:08 PM

## 2017-01-26 NOTE — Progress Notes (Signed)
PHARMACY - ADULT TOTAL PARENTERAL NUTRITION CONSULT NOTE   Pharmacy Consult for TPN Indication: small bowel resection  Patient Measurements: Height: 5\' 5"  (165.1 cm) Weight: 236 lb 12.4 oz (107.4 kg) IBW/kg (Calculated) : 57 TPN AdjBW (KG): 69.6 Body mass index is 39.4 kg/m.  Assessment:  66 yo female admitted initially to outside hospital due to abdominal pain. On 6/27 she underwent an ex lap which showed fulminant colitis and resulted in resection of her transverse colon, splenic fixture, descending colon, sigmoid colon and also resulted in a transverse colostomy. I have requested the TPN formula from the outside hospital, which should be faxed over this afternoon. She transferred to [facility] with TPN from the outside hospital. From the label it appears she has been receiving TPN 3 in 1 with electrolytes and with insulin 100 units / bag. Simultaneously she was transferred on an insulin infusion as her sugars have been in the 400s at the outside hospital.   Volume and rate of bag from outside hospital: 2304 mL at 96 mL/hr Awaiting remainder of TPN formula   Plan:  -Begin Clinimix E 5/15 @ 83 ml/hr; this will provide 100 g of protein and 1414 kcal -This will be ordered to hold patient over tonight until the formula can be further clarified tomorrow -Will hold IVFE for 7 days given patient is in the ICU day #1 -Since we do not have serial glucoses from the outside hospital, will add a smaller amount of insulin into bag tonight and will adjust further based on cbgs   MastersJake Church 01/26/2017,2:57 PM

## 2017-01-26 NOTE — Progress Notes (Signed)
Initial Nutrition Assessment  DOCUMENTATION CODES:   Obesity unspecified  INTERVENTION:   TPN per Pharmacy to meet nutrition needs as able  NUTRITION DIAGNOSIS:   Inadequate oral intake related to inability to eat as evidenced by NPO status.  GOAL:   Provide needs based on ASPEN/SCCM guidelines  MONITOR:   Vent status, Skin, Labs, I & O's  REASON FOR ASSESSMENT:   Consult New TPN/TNA  ASSESSMENT:   66 yo female with PMH of CHF, defibrillator, DM, HTN, gout, asthma, hypothyroidism who was admitted to [facility] on 6/25 with abdominal pain. S/P exploratory laparotomy on 6/27 with resection of distal transverse colon, splenic flexure, descending colon, and sigmoid colon with transverse colostomy.  Patient was transferred from [facility], where she was receiving TPN.  Received consult for new TPN. Pharmacy to manage TPN. Lipids to be held for the first 7 days of ICU stay per ASPEN guidelines.  Patient is currently intubated on ventilator support Temp (24hrs), Avg:100.7 F (38.2 C), Min:100.3 F (37.9 C), Max:101.1 F (38.4 C)  Unable to complete Nutrition-Focused physical exam at this time.  Labs and medications reviewed.  Diet Order:  Diet NPO time specified .TPN (CLINIMIX-E) Adult  Skin:   (open midline incision from recent abdominal surgery)  Last BM:  unknown  Height:   Ht Readings from Last 1 Encounters:  01/26/17 5\' 5"  (1.651 m)    Weight:   Wt Readings from Last 1 Encounters:  01/26/17 236 lb 12.4 oz (107.4 kg)    Ideal Body Weight:  56.8 kg  BMI:  Body mass index is 39.4 kg/m.  Estimated Nutritional Needs:   Kcal:  1200-1500  Protein:  115-130 gm  Fluid:  2 L  EDUCATION NEEDS:   No education needs identified at this time  Molli Barrows, [place], [place], [employer] Pager [phone] After Hours Pager [phone]

## 2017-01-27 ENCOUNTER — Inpatient Hospital Stay (HOSPITAL_COMMUNITY): Payer: Medicare Other

## 2017-01-27 DIAGNOSIS — K659 Peritonitis, unspecified: Secondary | ICD-10-CM

## 2017-01-27 DIAGNOSIS — N179 Acute kidney failure, unspecified: Secondary | ICD-10-CM

## 2017-01-27 LAB — URINE CULTURE: Culture: >=100000 — AB

## 2017-01-27 LAB — COMPREHENSIVE METABOLIC PANEL
ALT: 13 U/L — ABNORMAL LOW (ref 14–54)
ANION GAP: 11 (ref 5–15)
AST: 22 U/L (ref 15–41)
Albumin: 3.5 g/dL (ref 3.5–5.0)
Alkaline Phosphatase: 131 U/L — ABNORMAL HIGH (ref 38–126)
BILIRUBIN TOTAL: 0.8 mg/dL (ref 0.3–1.2)
BUN: 70 mg/dL — ABNORMAL HIGH (ref 6–20)
CO2: 27 mmol/L (ref 22–32)
Calcium: 9.4 mg/dL (ref 8.9–10.3)
Chloride: 101 mmol/L (ref 101–111)
Creatinine, Ser: 2.55 mg/dL — ABNORMAL HIGH (ref 0.44–1.00)
GFR, EST AFRICAN AMERICAN: 21 mL/min — AB (ref >60)
GFR, EST NON AFRICAN AMERICAN: 18 mL/min — AB (ref >60)
Glucose, Bld: 227 mg/dL — ABNORMAL HIGH (ref 65–99)
POTASSIUM: 4.2 mmol/L (ref 3.5–5.1)
Sodium: 139 mmol/L (ref 135–145)
Total Protein: 6.9 g/dL (ref 6.5–8.1)

## 2017-01-27 LAB — GLUCOSE, CAPILLARY
GLUCOSE-CAPILLARY: 268 mg/dL — AB (ref 65–99)
GLUCOSE-CAPILLARY: 258 mg/dL — AB (ref 65–99)
GLUCOSE-CAPILLARY: 154 mg/dL — AB (ref 65–99)
GLUCOSE-CAPILLARY: 163 mg/dL — AB (ref 65–99)
GLUCOSE-CAPILLARY: 166 mg/dL — AB (ref 65–99)
Glucose-Capillary: 145 mg/dL — ABNORMAL HIGH (ref 65–99)
Glucose-Capillary: 145 mg/dL — ABNORMAL HIGH (ref 65–99)
Glucose-Capillary: 151 mg/dL — ABNORMAL HIGH (ref 65–99)
Glucose-Capillary: 153 mg/dL — ABNORMAL HIGH (ref 65–99)
Glucose-Capillary: 153 mg/dL — ABNORMAL HIGH (ref 65–99)
Glucose-Capillary: 155 mg/dL — ABNORMAL HIGH (ref 65–99)
Glucose-Capillary: 156 mg/dL — ABNORMAL HIGH (ref 65–99)
Glucose-Capillary: 157 mg/dL — ABNORMAL HIGH (ref 65–99)
Glucose-Capillary: 159 mg/dL — ABNORMAL HIGH (ref 65–99)
Glucose-Capillary: 161 mg/dL — ABNORMAL HIGH (ref 65–99)
Glucose-Capillary: 181 mg/dL — ABNORMAL HIGH (ref 65–99)
Glucose-Capillary: 199 mg/dL — ABNORMAL HIGH (ref 65–99)
Glucose-Capillary: 199 mg/dL — ABNORMAL HIGH (ref 65–99)
Glucose-Capillary: 209 mg/dL — ABNORMAL HIGH (ref 65–99)
Glucose-Capillary: 224 mg/dL — ABNORMAL HIGH (ref 65–99)
Glucose-Capillary: 253 mg/dL — ABNORMAL HIGH (ref 65–99)
Glucose-Capillary: 293 mg/dL — ABNORMAL HIGH (ref 65–99)

## 2017-01-27 LAB — MAGNESIUM: MAGNESIUM: 1.9 mg/dL (ref 1.7–2.4)

## 2017-01-27 LAB — DIFFERENTIAL
BASOS ABS: 0 10*3/uL (ref 0.0–0.1)
Basophils Relative: 0 %
EOS ABS: 0.3 10*3/uL (ref 0.0–0.7)
Eosinophils Relative: 2 %
LYMPHS ABS: 1.9 10*3/uL (ref 0.7–4.0)
Lymphocytes Relative: 13 %
Monocytes Absolute: 1.2 10*3/uL — ABNORMAL HIGH (ref 0.1–1.0)
Monocytes Relative: 8 %
NEUTROS PCT: 77 %
Neutro Abs: 11.2 10*3/uL — ABNORMAL HIGH (ref 1.7–7.7)

## 2017-01-27 LAB — CBC
HCT: 27.2 % — ABNORMAL LOW (ref 36.0–46.0)
Hemoglobin: 8.3 g/dL — ABNORMAL LOW (ref 12.0–15.0)
MCH: 26.5 pg (ref 26.0–34.0)
MCHC: 30.5 g/dL (ref 30.0–36.0)
MCV: 86.9 fL (ref 78.0–100.0)
PLATELETS: 316 10*3/uL (ref 150–400)
RBC: 3.13 MIL/uL — AB (ref 3.87–5.11)
RDW: 17.1 % — ABNORMAL HIGH (ref 11.5–15.5)
WBC: 14.6 10*3/uL — ABNORMAL HIGH (ref 4.0–10.5)

## 2017-01-27 LAB — TRIGLYCERIDES: Triglycerides: 515 mg/dL — ABNORMAL HIGH (ref <150)

## 2017-01-27 LAB — PREALBUMIN: PREALBUMIN: 14.6 mg/dL — AB (ref 18–38)

## 2017-01-27 LAB — PHOSPHORUS: Phosphorus: 3.7 mg/dL (ref 2.5–4.6)

## 2017-01-27 MED ORDER — MIDAZOLAM HCL 50 MG/10ML IJ SOLN: 0.0000 mg/h | INTRAMUSCULAR | Status: DC

## 2017-01-27 MED ORDER — FUROSEMIDE 10 MG/ML IJ SOLN
40.0000 mg | Freq: Two times a day (BID) | INTRAMUSCULAR | Status: AC
  Administered 2017-01-27 (×2): 40 mg via INTRAVENOUS
  Filled 2017-01-27 (×2): qty 4

## 2017-01-27 MED ORDER — METRONIDAZOLE IN NACL 5-0.79 MG/ML-% IV SOLN
500.0000 mg | Freq: Three times a day (TID) | INTRAVENOUS | Status: DC
  Administered 2017-01-27 – 2017-01-28 (×4): 500 mg via INTRAVENOUS
  Filled 2017-01-27 (×5): qty 100

## 2017-01-27 MED ORDER — LEVOTHYROXINE SODIUM 100 MCG IV SOLR
44.0000 ug | Freq: Every day | INTRAVENOUS | Status: DC
  Administered 2017-01-28 – 2017-01-31 (×4): 44 ug via INTRAVENOUS
  Filled 2017-01-27 (×4): qty 5

## 2017-01-27 MED ORDER — POTASSIUM CHLORIDE 20 MEQ/15ML (10%) PO SOLN
40.0000 meq | Freq: Two times a day (BID) | ORAL | Status: DC
  Administered 2017-01-27: 40 meq
  Filled 2017-01-27: qty 30

## 2017-01-27 MED ORDER — M.V.I. ADULT IV INJ
INTRAVENOUS | Status: DC
  Administered 2017-01-27: 17:00:00 via INTRAVENOUS
  Filled 2017-01-27: qty 1992

## 2017-01-27 MED ORDER — MIDAZOLAM BOLUS VIA INFUSION
1.0000 mg | INTRAVENOUS | Status: DC | PRN
  Filled 2017-01-27: qty 2

## 2017-01-27 MED ORDER — FENTANYL CITRATE (PF) 2500 MCG/50ML IJ SOLN
25.0000 ug/h | Status: DC
  Administered 2017-01-27: 75 ug/h via INTRAVENOUS
  Administered 2017-01-29: 100 ug/h via INTRAVENOUS
  Filled 2017-01-27 (×2): qty 100

## 2017-01-27 NOTE — Progress Notes (Signed)
PHARMACY - ADULT TOTAL PARENTERAL NUTRITION CONSULT NOTE   Pharmacy Consult for TPN Indication: small bowel resection  Patient Measurements: Height: _0  (165.1 cm) Weight: 239 lb 6.7 oz (108.6 kg) IBW/kg (Calculated) : 57 TPN AdjBW (KG): 69.6 Body mass index is 39.84 kg/m.  Assessment: 66 yo female admitted initially to [facility] hospital due to abdominal pain. On 6/27 she underwent an ex lap which showed fulminant colitis and resulted in resection of her transverse colon, splenic fixture, descending colon, sigmoid colon and also resulted in a transverse colostomy. Patient was slow to improve and wean for ventilator with volume overload. Transferred to [facility] on 7/5.   [place] TPN Formula: TPN 3 in 1 (lipids) with electrolytes at 100 ml/hr and with insulin 100 units / bag, MVI, and TE. Simultaneously she was transferred on an insulin infusion as her sugars have been in the 400s at the outside hospital.   GI: Fulminant colitis s/p small bowel resection and colectomy with transverse colostomy with open mid-line incision. Albumin 3.5, Pre-albumin 14.6. NGT output 100 ml. Markedly distended, stoma dark/dusty but viable per Surgery.  Endo: DM, Hypothyroidism. CBGs 199-336  Insulin requirements in the past 24 hours: Insulin drip at 15.8 units/hr + 50 units in TPN bag last PM.  Lytes: K 4.2 - gave additional 27mq x2, Mg 1.9, Phos 3.7 - wnl.  Renal: AoCKD: SCr up to 2.55, BUN 70, CrCl ~ 25-30 ml/min. UOP 1.1 cc/kg/hr. Grossly volume overloaded per notes. Giving Lasix 467mIV BID x2 doses today.  Pulm: Vent, FiO2 40%. Cards: CHF (EF 55% on ECHO at [facility]), defibrillator, HTN - Hypotensive, now off Norepinephrine. NSR. CVP 16.  Hepatobil: LFT ok except mildly elevated Alk Phos. TG 515.  Neuro: Fentanyl @ 75 mcg/hr, Propofol off. CPOT 4, RASS -2, GCS 11. Opens eyse to stimulation. ID: Vancomycin + Zosyn + Flagyl d#10 (initiated 6/27 at [facility]). Tmax 101. WBC down to 14.6. Cultures pending.    Best Practices: Heparin [place], PPI TPN Access: Triple Lumen PICC placed 6/28 at [facility] TPN start date: TPN 6/28 (initiated at [facility]) >>  Nutritional Goals (per RD recommendation on 01/26/17): KCal: 1200-1500 Protein: 115-130 gm  Current Nutrition:  NPO TPN - Received E 5/15 at 83 mL/hr (no lipids) on 7/5.  Plan:  Continue Clinimix E 5/15 at 8379mr (no higher due to gross volume overload). Hold 20% lipid emulsion for Triglycerides of 515. (Patient has been in ICU >7 days and has been receiving lipids the entire time at [facility]). Will follow-up TG on Monday to decide if should resume Lipids.  This provides 99.6 g of protein and 1414 kCals per day meeting 87% of protein and 100% of kCal needs Add MVI in TPN Add trace elements every other day in TPN, next 7/7 Increase to 100 units of regular insulin in TPN Continue Insulin drip per primary team. Monitor TPN labs F/U CBG control  F/U Volume status - may need to decrease rate further to help volume status as unable to concentrate Clinimix.   JesSloan LeiterharmD, BCPS Clinical Pharmacist Clinical Phone 01/27/2017 until 3:30 PM- #[phone] hours, please call #[phone] 01/27/2017,9:55 AM

## 2017-01-27 NOTE — Progress Notes (Signed)
[facility] Progress Note     Subjective: CC: colitis Patient is intubated and sedated but opens eyes to verbal/physical stimulation. No family at bedside.   Objective: Vital signs in last 24 hours: Temp:  [99.5 F (37.5 C)-101.1 F (38.4 C)] 101 F (38.3 C) (07/06 0404) Pulse Rate:  [78-101] 97 (07/06 0600) Resp:  [19-25] 25 (07/06 0600) BP: (80-122)/(40-68) 111/46 (07/06 0600) SpO2:  [94 %-98 %] 96 % (07/06 0754) FiO2 (%):  [40 %-60 %] 40 % (07/06 0754) Weight:  [107.4 kg (236 lb 12.4 oz)-108.6 kg (239 lb 6.7 oz)] 108.6 kg (239 lb 6.7 oz) (07/06 0424) Last BM Date: 01/27/17   Vent Mode: PSV;CPAP FiO2 (%):  [40 %-60 %] 40 % Set Rate:  [16 bmp-20 bmp] 16 bmp Vt Set:  [460 mL-500 mL] 460 mL PEEP:  [5 cmH20] 5 cmH20 Pressure Support:  [12 cmH20] 12 cmH20 Plateau Pressure:  [18 cmH20-24 cmH20] 19 cmH20  Intake/Output from previous day: 07/05 0701 - 07/06 0700 In: 1273.3 [I.V.:1263.3] Out: 1525 [Urine:1425; Emesis/NG output:100] Intake/Output this shift: No intake/output data recorded.  PE: Gen:  Intubated and sedated. Card:  Regular rate and rhythm, no M/G/R appreciated Pulm:  Ventilator sounds bilaterally Abd: Soft, moderately distended, bowel sounds hypoactive. Ostomy with small amt liquid brown stool, stoma dusky. Midline wound clean, partially open with interrupted staples.  Skin: warm and dry, no rashes  Psych: A&Ox3   Lab Results:   Recent Labs  01/26/17 1400 01/27/17 0505  WBC 15.7* 14.6*  HGB 8.5* 8.3*  HCT 26.2* 27.2*  PLT 330 316   BMET  Recent Labs  01/26/17 1400 01/27/17 0505  NA 137 139  K 4.1 4.2  CL 98* 101  CO2 26 27  GLUCOSE 303* 227*  BUN 60* 70*  CREATININE 2.17* 2.55*  CALCIUM 9.3 9.4   PT/INR  Recent Labs  01/26/17 1400  LABPROT 15.0  INR 1.17   CMP     Component Value Date/Time   NA 139 01/27/2017 0505   K 4.2 01/27/2017 0505   CL 101 01/27/2017 0505   CO2 27 01/27/2017 0505   GLUCOSE 227 (H)  01/27/2017 0505   BUN 70 (H) 01/27/2017 0505   CREATININE 2.55 (H) 01/27/2017 0505   CALCIUM 9.4 01/27/2017 0505   PROT 6.9 01/27/2017 0505   ALBUMIN 3.5 01/27/2017 0505   AST 22 01/27/2017 0505   ALT 13 (L) 01/27/2017 0505   ALKPHOS 131 (H) 01/27/2017 0505   BILITOT 0.8 01/27/2017 0505   GFRNONAA 18 (L) 01/27/2017 0505   GFRAA 21 (L) 01/27/2017 0505   Lipase     Component Value Date/Time   LIPASE 37 01/26/2017 1400       Studies/Results: Dg Chest Port 1 View  Result Date: 01/26/2017 CLINICAL DATA:  PICC line placement EXAM: PORTABLE CHEST 1 VIEW COMPARISON:  Earlier the same day FINDINGS: 2002 hours. Endotracheal tube tip is approximately 3.5 cm above the base of the carina. The NG tube passes into the stomach although the distal tip position is not included on the film. A left-sided PICC line is evident but somewhat difficult to see given the superimposed pacer/AICD wires and poor opacity of the PICC line. The tip appears to be overlying the spine at approximately the expected level of mid SVC. The catheter is following the same course as the pacer leads appear similar to the prior study. The cardio pericardial silhouette is enlarged. Pulmonary vascular congestion noted with interval progression of bibasilar collapse/consolidation and  probable bilateral pleural effusions. IMPRESSION: Left PICC line tip is in a position overlying the mid SVC, the following a similar course as the pacer wires. Interval progression of bibasilar collapse/ consolidation. Electronically Signed   By: Misty Stanley M.D.   On: 01/26/2017 20:42   Dg Chest Port 1 View  Result Date: 01/26/2017 CLINICAL DATA:  Endotracheal tube placement EXAM: PORTABLE CHEST 1 VIEW COMPARISON:  01/30/2014 FINDINGS: Endotracheal tube with tip at the clavicular heads. An orogastric tube reaches the stomach. Chronic cardiopericardial enlargement. Vascular pedicle widening. These changes are progressed from prior in the setting of semi  erect position. Two right-sided transvenous pacer leads and a left-sided direct epicardial pacer. Lead positioning is similar to prior. There is pulmonary edema and likely small pleural effusions. No air bronchogram. IMPRESSION: 1. Unremarkable positioning of endotracheal and orogastric tubes. 2. Layering pleural effusions and probable pulmonary edema. Chronic cardiomegaly. 3. Low volume chest. Electronically Signed   By: Monte Fantasia M.D.   On: 01/26/2017 15:16    Anti-infectives: Anti-infectives    Start     Dose/Rate Route Frequency Ordered Stop   01/27/17 1000  metroNIDAZOLE (FLAGYL) IVPB 500 mg     500 mg 100 mL/hr over 60 Minutes Intravenous Every 8 hours 01/27/17 0925     01/26/17 1600  vancomycin (VANCOCIN) IVPB 1000 mg/200 mL premix     1,000 mg 200 mL/hr over 60 Minutes Intravenous Every 24 hours 01/26/17 1456     01/26/17 1600  piperacillin-tazobactam (ZOSYN) IVPB 3.375 g     3.375 g 12.5 mL/hr over 240 Minutes Intravenous Every 8 hours 01/26/17 1456     01/26/17 1430  metroNIDAZOLE (FLAGYL) IVPB 500 mg  Status:  Discontinued     500 mg 100 mL/hr over 60 Minutes Intravenous Every 8 hours 01/26/17 1421 01/26/17 1427       Assessment/Plan 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 Matt Holmes, 01/18/17 [facility] - Open midline incision: Wet-to-dry dressing changes - Ostomy: WOC applied foam dressings to protect skin and promote healing where MARSI has occurred. Stoma is dark/dusky but viable currently - Patient appears markedly distended, there is output in ostomy bag. Bowel sounds present but quiet.  - WBC 14.6, Tmax 101 - CT abdomen/pelvis pending Septic shock Acute renal failure- Cr 2.55 from 2.17 yesterday Acute on chronic diastolic CHF Acute respiratory failure w/ hypoxemia in post-op setting Anemia - H/H 8.3/27.2 DM  FEN - NPO/IVF, TPN VTE - SQ heparin ID - flagyl (7/6>>), zosyn (7/5>>), vanco (7/5>>)  Plan: Continue wet to dry dressing  changes BID. Continue ostomy care.   LOS: 1 day    [name] , [facility] 01/27/2017, 9:38 AM Pager: [phone] Consults: [phone] Mon-Fri 7:00 am-4:30 pm Sat-Sun 7:00 am-11:30 am

## 2017-01-27 NOTE — Progress Notes (Signed)
Report given to [facility].  Pt has no s/s of any acute distress or pain

## 2017-01-27 NOTE — Progress Notes (Signed)
Pt transported on vent to CT and back to 2M15. Pt's vitals remained stable throughout.

## 2017-01-27 NOTE — Consult Note (Addendum)
[place] Nurse ostomy consult note Surgical team following for assessment and plan of care to midline abd surgical wound.  Stoma type/location: Pt had colostomy surgery on 6/27 at another facility, according to the EMR.  Family member at the bedside states pt's abd became extremely swollen prior to the transfer to [facility] and this has contributed to medical adhesive related skin damage (MARSI) underneath the tape boarder surrounding the ostomy pouch. There is no date on the pouch to indicate how many days it has been in place.The swelling has stretched the skin underneath the pouch adhesive and created large blood blisters with intact skin over the surface; these ruptured when the pouch was removed and evolved into partial thickness skin loss.   Stomal assessment/size: Stoma is dark brown and dusky; 1 3/4 inches, above skin level, dark brown tightly adhered eschar to stoma from 1:00 o'clock to 4:00 o'clock Peristomal assessment: Partial thickness skin loss related to MARSI; both locations red and moist with mod amt bloody drainage after the loose peeling skin was removed. Left side of stoma 9X4X.1cm Right side of stoma 9X5X.1cm Treatment options for peristomal skin: Applied foam dressings to protect skin and promote healing where MARSI has occurred. One piece pouch applied Output: Small amt brown semi-formed stool Ostomy pouching: 1pc.  Education provided: Pt is on the vent and is critically ill.  Discussed plan of care with family member at the bedside. Will begin teaching sessions when stable and out of ICU. Supplies ordered to the bedside for staff nurse use. Enrolled patient in [place] program: No  Julien Girt MSN, [place], [place], [place], [place]

## 2017-01-27 NOTE — Progress Notes (Signed)
Inpatient Diabetes Program Recommendations  AACE/ADA: New Consensus Statement on Inpatient Glycemic Control (2015)  Target Ranges:  Prepandial:   less than 140 mg/dL      Peak postprandial:   less than 180 mg/dL (1-2 hours)      Critically ill patients:  140 - 180 mg/dL   Lab Results  Component Value Date   GLUCAP 199 (H) 01/27/2017    Review of Glycemic ControlResults for [name], [name] (MRN [id number]) as of 01/27/2017 09:09  Ref. Range 01/27/2017 03:54 01/27/2017 05:00 01/27/2017 05:53 01/27/2017 06:54 01/27/2017 07:59  Glucose-Capillary Latest Ref Range: 65 - 99 mg/dL 253 (H) 224 (H) 209 (H) 199 (H) 199 (H)    Diabetes history: Type 2 DM Outpatient Diabetes medications: Toujeo 100 units daily, Apidra 20 units tid with meals, Tradjenta 5 mg daily Current orders for Inpatient glycemic control:  TPN at 83 cc/hr with 50 units of insulin per liter IV insulin per ICU glycemic control order set- insulin drip rates>20 units/hr Inpatient Diabetes Program Recommendations:    Agree with current orders.   Continue insulin drip until drip rates <4 units/hr and CBG's<180 mg/dL for 6 hours per ICU glycemic control order set.   Thanks, Crystal Perl, RN, BC-ADM Inpatient Diabetes Coordinator Pager [phone] (8a-5p)

## 2017-01-27 NOTE — Progress Notes (Signed)
PULMONARY / CRITICAL CARE MEDICINE   Name: Crystal [name] MRN: [id number] DOB: Oct 12, 1950    ADMISSION DATE:  01/26/2017 (admit to [facility] 6/27) CONSULTATION DATE:  7/5  REFERRING MD:  Dr. Jens Som  CHIEF COMPLAINT:  Septic shock, colitis, VDRF  HISTORY OF PRESENT ILLNESS:   66 year old female with PMH a significant for CHF with defibrillator in place, CKD, DM, OSA on CPAP, COPD, and Hypothyroidism. Admitted 6/25 to [facility] for fulminant colitis requiring partial colectomy and colostomy. Abdomen left open and left on vent. Slow to improve and wean from vent. Grossly volume overloaded. Transferred to [facility] 7/5.  SUBJECTIVE: no acute events overnight  VITAL SIGNS: BP (!) 111/46   Pulse 97   Temp (!) 101 F (38.3 C) (Oral)   Resp (!) 25   Ht 5\' 5"  (1.651 m)   Wt 108.6 kg (239 lb 6.7 oz)   SpO2 96%   BMI 39.84 kg/m   HEMODYNAMICS: CVP:  [12 mmHg-17 mmHg] 16 mmHg  VENTILATOR SETTINGS: Vent Mode: PSV;CPAP FiO2 (%):  [40 %-60 %] 40 % Set Rate:  [16 bmp-20 bmp] 16 bmp Vt Set:  [460 mL-500 mL] 460 mL PEEP:  [5 cmH20] 5 cmH20 Pressure Support:  [12 cmH20] 12 cmH20 Plateau Pressure:  [18 cmH20-24 cmH20] 19 cmH20  INTAKE / OUTPUT: I/O last 3 completed shifts: In: 1273.3 [I.V.:1263.3; Other:10] Out: 4650 [Urine:1425; Emesis/NG output:100]  PHYSICAL EXAMINATION:  General:  Morbidly obese female in NAD on vent Neuro:  Sedated, opens eyes to voice. RASS -2. HEENT:  [place]/AT, PERRL, no appreciable JVD Cardiovascular:  RRR, no MRG Lungs:  Coarse bilateral breath sounds. Weaning well 12/5 with minimal sedation.  Abdomen:  Midline longitudinal dressing CDI where abdomen open. Stoma dark, dusky. Draining liquid stool.  Musculoskeletal:  No acute deformity. upper extremity edema.  Skin:  Grossly intact with the exception of abdominal wounds.   LABS:  BMET  Recent Labs Lab 01/26/17 1400 01/27/17 0505  NA 137 139  K 4.1 4.2  CL 98* 101  CO2 26 27  BUN 60* 70*  CREATININE  2.17* 2.55*  GLUCOSE 303* 227*    Electrolytes  Recent Labs Lab 01/26/17 1400 01/27/17 0505  CALCIUM 9.3 9.4  MG 1.9 1.9  PHOS 3.6 3.7    CBC  Recent Labs Lab 01/26/17 1400 01/27/17 0505  WBC 15.7* 14.6*  HGB 8.5* 8.3*  HCT 26.2* 27.2*  PLT 330 316    Coag's  Recent Labs Lab 01/26/17 1400  INR 1.17    Sepsis Markers  Recent Labs Lab 01/26/17 1400  LATICACIDVEN 1.7  PROCALCITON 2.44    ABG  Recent Labs Lab 01/26/17 1505  PHART 7.382  PCO2ART 48.4*  PO2ART 93.3    Liver Enzymes  Recent Labs Lab 01/26/17 1400 01/27/17 0505  AST 25 22  ALT 13* 13*  ALKPHOS 133* 131*  BILITOT 0.9 0.8  ALBUMIN 3.6 3.5    Cardiac Enzymes  Recent Labs Lab 01/26/17 1400  TROPONINI <0.03    Glucose  Recent Labs Lab 01/27/17 0254 01/27/17 0354 01/27/17 0500 01/27/17 0553 01/27/17 0654 01/27/17 0759  GLUCAP 258* 253* 224* 209* 199* 199*    Imaging Dg Chest Port 1 View  Result Date: 01/26/2017 CLINICAL DATA:  PICC line placement EXAM: PORTABLE CHEST 1 VIEW COMPARISON:  Earlier the same day FINDINGS: 2002 hours. Endotracheal tube tip is approximately 3.5 cm above the base of the carina. The NG tube passes into the stomach although the distal tip position is not  included on the film. A left-sided PICC line is evident but somewhat difficult to see given the superimposed pacer/AICD wires and poor opacity of the PICC line. The tip appears to be overlying the spine at approximately the expected level of mid SVC. The catheter is following the same course as the pacer leads appear similar to the prior study. The cardio pericardial silhouette is enlarged. Pulmonary vascular congestion noted with interval progression of bibasilar collapse/consolidation and probable bilateral pleural effusions. IMPRESSION: Left PICC line tip is in a position overlying the mid SVC, the following a similar course as the pacer wires. Interval progression of bibasilar collapse/  consolidation. Electronically Signed   By: Misty Stanley M.D.   On: 01/26/2017 20:42   Dg Chest Port 1 View  Result Date: 01/26/2017 CLINICAL DATA:  Endotracheal tube placement EXAM: PORTABLE CHEST 1 VIEW COMPARISON:  01/30/2014 FINDINGS: Endotracheal tube with tip at the clavicular heads. An orogastric tube reaches the stomach. Chronic cardiopericardial enlargement. Vascular pedicle widening. These changes are progressed from prior in the setting of semi erect position. Two right-sided transvenous pacer leads and a left-sided direct epicardial pacer. Lead positioning is similar to prior. There is pulmonary edema and likely small pleural effusions. No air bronchogram. IMPRESSION: 1. Unremarkable positioning of endotracheal and orogastric tubes. 2. Layering pleural effusions and probable pulmonary edema. Chronic cardiomegaly. 3. Low volume chest. Electronically Signed   By: Monte Fantasia M.D.   On: 01/26/2017 15:16     STUDIES:  CT abdomen 6/25 > Markedly distended colon, without small bowel dilation. Colonic diverticulosis without diverticulitis or colitis.  CT chest/abd/pelvis 7/3 > Echo 6/28 > LVEF 55 %  CULTURES: Blood 7/5 > Urine  7/5 > Tracheal asp 7/5 >  ANTIBIOTICS: Vancomycin 6/27 > Zosyn 6/27 > Flagyl 6/27 >  SIGNIFICANT EVENTS: 6/25 admit for RLQ pain > 6/27 to OR for colitis >  LINES/TUBES: LUE PICC pre-hosp> ETT 6/27 > Foley pre-hosp>  DISCUSSION: 66 year old female with extensive PMH who presented to [facility] for abdominal wall who was found to have severe colitis and was taken to the OR and left open.  After a week, patient made little to no progress and family requited transfer to [facility] for further care.  ASSESSMENT / PLAN:  PULMONARY A: Acute respiratory failure with hypoxemia in post operative setting. Likely secondary to volume overload and need for sedation.  P:   Continue full vent support. Follow CXT Titrate O2 for sat > 90% No surgical plans  requiring intubation, ok to wean as tolerated.  CARDIOVASCULAR A:  Shock: septic. Off Levophed maintaining BP Acute on chronic diastolic CHF. Volume overloaded. (LVEF 55% on OSH Echo) Hypertriglyceridemia  P:  Tele monitoring Follow CVP (13 7/6) KVO IVF Diurese as tolerated Goal I&O negative Need to change sedation from lipid based  RENAL A:   Acute renal failure > worsening  P:   BMET in AM Replace electrolytes as indicated Lasix 40 mg IV BID x 2 doses and re-evaluate in the morning 2 doses of KCl 40  GASTROINTESTINAL A:   Colitis Open abdomen Nutrition  P:   Surgery following, recommending CT with fevers. Ordered Wound care per [facility] and [place].  NPO TPN per pharmacy Abx as below  HEMATOLOGIC A:   Anemia P:  CBC in AM Transfuse per ICU protocol  INFECTIOUS A:   Intra-abdominal infection P:   ABX as above Follow cultures  ENDOCRINE A:   DM   P:   CBGs Insulin drip  NEUROLOGIC A:   Sedation P:   RASS goal: -1 to -2 Fentanyl drip Propofol change to versed infusion due to high triglycerides.  May need deeper sedation with open abdomen if she becomes agitated.    FAMILY  - Updates: Family updated bedside  - Inter-disciplinary family meet or Palliative Care meeting due by: 7/11   Georgann Housekeeper, AGACNP-BC [place] Pulmonology/Critical Care Pager [phone] or [phone]  01/27/2017 9:27 AM

## 2017-01-28 ENCOUNTER — Encounter (HOSPITAL_COMMUNITY): Payer: Self-pay | Admitting: *Deleted

## 2017-01-28 ENCOUNTER — Inpatient Hospital Stay (HOSPITAL_COMMUNITY): Payer: Medicare Other

## 2017-01-28 DIAGNOSIS — I5031 Acute diastolic (congestive) heart failure: Secondary | ICD-10-CM

## 2017-01-28 DIAGNOSIS — L8990 Pressure ulcer of unspecified site, unspecified stage: Secondary | ICD-10-CM | POA: Insufficient documentation

## 2017-01-28 LAB — BASIC METABOLIC PANEL
Anion gap: 11 (ref 5–15)
BUN: 89 mg/dL — AB (ref 6–20)
CO2: 25 mmol/L (ref 22–32)
CREATININE: 2.89 mg/dL — AB (ref 0.44–1.00)
Calcium: 9.1 mg/dL (ref 8.9–10.3)
Chloride: 103 mmol/L (ref 101–111)
GFR calc Af Amer: 18 mL/min — ABNORMAL LOW (ref >60)
GFR, EST NON AFRICAN AMERICAN: 16 mL/min — AB (ref >60)
Glucose, Bld: 153 mg/dL — ABNORMAL HIGH (ref 65–99)
POTASSIUM: 4.2 mmol/L (ref 3.5–5.1)
SODIUM: 139 mmol/L (ref 135–145)

## 2017-01-28 LAB — GLUCOSE, CAPILLARY
GLUCOSE-CAPILLARY: 125 mg/dL — AB (ref 65–99)
GLUCOSE-CAPILLARY: 146 mg/dL — AB (ref 65–99)
GLUCOSE-CAPILLARY: 151 mg/dL — AB (ref 65–99)
GLUCOSE-CAPILLARY: 151 mg/dL — AB (ref 65–99)
GLUCOSE-CAPILLARY: 152 mg/dL — AB (ref 65–99)
GLUCOSE-CAPILLARY: 167 mg/dL — AB (ref 65–99)
GLUCOSE-CAPILLARY: 454 mg/dL — AB (ref 65–99)
Glucose-Capillary: 146 mg/dL — ABNORMAL HIGH (ref 65–99)
Glucose-Capillary: 149 mg/dL — ABNORMAL HIGH (ref 65–99)
Glucose-Capillary: 151 mg/dL — ABNORMAL HIGH (ref 65–99)
Glucose-Capillary: 152 mg/dL — ABNORMAL HIGH (ref 65–99)
Glucose-Capillary: 157 mg/dL — ABNORMAL HIGH (ref 65–99)
Glucose-Capillary: 162 mg/dL — ABNORMAL HIGH (ref 65–99)
Glucose-Capillary: 174 mg/dL — ABNORMAL HIGH (ref 65–99)
Glucose-Capillary: 217 mg/dL — ABNORMAL HIGH (ref 65–99)
Glucose-Capillary: 395 mg/dL — ABNORMAL HIGH (ref 65–99)

## 2017-01-28 LAB — MAGNESIUM
MAGNESIUM: 2.2 mg/dL (ref 1.7–2.4)
MAGNESIUM: 2.2 mg/dL (ref 1.7–2.4)

## 2017-01-28 LAB — CBC
HCT: 26 % — ABNORMAL LOW (ref 36.0–46.0)
Hemoglobin: 8.1 g/dL — ABNORMAL LOW (ref 12.0–15.0)
MCH: 27.4 pg (ref 26.0–34.0)
MCHC: 31.2 g/dL (ref 30.0–36.0)
MCV: 87.8 fL (ref 78.0–100.0)
PLATELETS: 406 10*3/uL — AB (ref 150–400)
RBC: 2.96 MIL/uL — AB (ref 3.87–5.11)
RDW: 17.2 % — ABNORMAL HIGH (ref 11.5–15.5)
WBC: 15.4 10*3/uL — ABNORMAL HIGH (ref 4.0–10.5)

## 2017-01-28 LAB — PHOSPHORUS
PHOSPHORUS: 5.3 mg/dL — AB (ref 2.5–4.6)
Phosphorus: 5.4 mg/dL — ABNORMAL HIGH (ref 2.5–4.6)

## 2017-01-28 MED ORDER — PRO-STAT SUGAR FREE PO LIQD
30.0000 mL | Freq: Two times a day (BID) | ORAL | Status: DC
  Administered 2017-01-28: 30 mL
  Filled 2017-01-28 (×2): qty 30

## 2017-01-28 MED ORDER — FUROSEMIDE 10 MG/ML IJ SOLN
120.0000 mg | Freq: Four times a day (QID) | INTRAVENOUS | Status: AC
  Administered 2017-01-28 (×2): 120 mg via INTRAVENOUS
  Filled 2017-01-28 (×2): qty 12

## 2017-01-28 MED ORDER — INSULIN ASPART 100 UNIT/ML ~~LOC~~ SOLN
0.0000 [IU] | SUBCUTANEOUS | Status: DC
  Administered 2017-01-28: 20 [IU] via SUBCUTANEOUS
  Administered 2017-01-28: 3 [IU] via SUBCUTANEOUS
  Administered 2017-01-28: 7 [IU] via SUBCUTANEOUS

## 2017-01-28 MED ORDER — INSULIN GLARGINE 100 UNIT/ML ~~LOC~~ SOLN
75.0000 [IU] | Freq: Two times a day (BID) | SUBCUTANEOUS | Status: DC
  Administered 2017-01-28 (×2): 75 [IU] via SUBCUTANEOUS
  Filled 2017-01-28 (×3): qty 0.75

## 2017-01-28 MED ORDER — VITAL HIGH PROTEIN PO LIQD
1000.0000 mL | ORAL | Status: DC
  Administered 2017-01-28: 1000 mL
  Administered 2017-01-28: 17:00:00

## 2017-01-28 MED ORDER — VITAL HIGH PROTEIN PO LIQD: 1000.0000 mL | ORAL | Status: DC

## 2017-01-28 MED ORDER — DEXAMETHASONE SODIUM PHOSPHATE 4 MG/ML IJ SOLN
4.0000 mg | Freq: Four times a day (QID) | INTRAMUSCULAR | Status: DC
  Administered 2017-01-28 – 2017-01-29 (×4): 4 mg via INTRAVENOUS
  Filled 2017-01-28 (×5): qty 1
  Filled 2017-01-28 (×2): qty 0.4

## 2017-01-28 NOTE — Progress Notes (Signed)
Nutrition Follow-up  DOCUMENTATION CODES:  Obesity unspecified  INTERVENTION:  Initiate TF via OGT with Vital High Protein at goal rate of 55 ml/h (1320 ml per day) to provide 1320 kcals, 116 gm protein, 1104 ml free water daily.  NUTRITION DIAGNOSIS:  Inadequate oral intake related to inability to eat as evidenced by NPO status.  GOAL:  Provide needs based on ASPEN/SCCM guidelines  MONITOR:  Diet advancement, Vent status, Labs, Weight trends, I & O's  REASON FOR ASSESSMENT:  Consult Enteral/tube feeding initiation and management  ASSESSMENT:  66 yo female with PMH of CHF, defibrillator, DM, HTN, gout, asthma, hypothyroidism who was admitted to [facility] on 6/25 with abdominal pain. S/P exploratory laparotomy on 6/27 with resection of distal transverse colon, splenic flexure, descending colon, and sigmoid colon with transverse colostomy.  Per MD today, TPN held with initiation of TF.   Family in room. They state pt's UBW is ~ 190s. Current weight in 230s.   RN reports midline wound has very minimal drainage and is stapled.  Phos is slightly elevated, but do not believe warrants renal formula at this time.   Vital high protein is currently infusing at 40 cc/hr  Patient is currently intubated on ventilator support MV:  9.5 L/min Temp (24hrs), Avg:99.9 F (37.7 C), Min:99 F (37.2 C), Max:100.6 F (38.1 C) Propofol: None  Labs Renal labs increasing BUN:89 Creat: 2.89, BGs 150-300 Meds: Decadron, Vital high protein, Fentanyl, Prostat, insulin,IV  Lasix   Recent Labs Lab 01/26/17 1400 01/27/17 0505 01/28/17 0616 01/28/17 1224  NA 137 139 139  --   K 4.1 4.2 4.2  --   CL 98* 101 103  --   CO2 26 27 25   --   BUN 60* 70* 89*  --   CREATININE 2.17* 2.55* 2.89*  --   CALCIUM 9.3 9.4 9.1  --   MG 1.9 1.9  --  2.2  PHOS 3.6 3.7  --  5.3*  GLUCOSE 303* 227* 153*  --    Diet Order:  Diet NPO time specified  Skin:  Left nare stage 1, Right ear stage 1,  open midline incision from recent abdominal surgery- RN reports minimal drainage  Last BM:  7/7  Height:   Ht Readings from Last 1 Encounters:  01/26/17 5\' 5"  (1.651 m)    Weight:  Wt Readings from Last 1 Encounters:  01/28/17 237 lb 10.5 oz (107.8 kg)   Wt Readings from Last 10 Encounters:  01/28/17 237 lb 10.5 oz (107.8 kg)  03/16/16 212 lb (96.2 kg)  12/14/15 205 lb (93 kg)  11/03/14 200 lb (90.7 kg)  05/05/14 200 lb (90.7 kg)  04/07/14 198 lb (89.8 kg)  02/10/14 196 lb (88.9 kg)  01/30/14 201 lb 1.9 oz (91.2 kg)   Dosing weight:  236 lbs 12 oz. 107.6 kg  Ideal Body Weight:  56.8 kg  BMI:  Body mass index is 39.55 kg/m.  Estimated Nutritional Needs:  Kcal:  1180-1500 kcals (11-14 kcals/ kg bw) Protein:  >114 g pro (2g/kg bw) Fluid:  Per MD  EDUCATION NEEDS:  No education needs identified at this time  Burtis Junes RD, LDN, [place] Clinical Nutrition Pager: [phone] 01/28/2017 3:19 PM

## 2017-01-28 NOTE — Progress Notes (Signed)
PULMONARY / CRITICAL CARE MEDICINE   Name: Crystal [name] MRN: [id number] DOB: 1950/09/07    ADMISSION DATE:  01/26/2017 (admit to [facility] 6/27) CONSULTATION DATE:  7/5  REFERRING MD:  Dr. Jens Som  CHIEF COMPLAINT:  Septic shock, colitis, VDRF  HISTORY OF PRESENT ILLNESS:   66 year old female with PMH a significant for CHF with defibrillator in place, CKD, DM, OSA on CPAP, COPD, and Hypothyroidism. 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 Abdomen left open and left on vent. Slow to improve and wean from vent. Grossly volume overloaded. Transferred to [facility] 7/5.  SUBJECTIVE:  Weaning on vent CT abdomen without new changes Passing SBT  VITAL SIGNS: BP (!) 96/40   Pulse 92   Temp 99 F (37.2 C) (Oral)   Resp 19   Ht 5\' 5"  (1.651 m)   Wt 107.8 kg (237 lb 10.5 oz)   SpO2 95%   BMI 39.55 kg/m   HEMODYNAMICS: CVP:  [12 mmHg-16 mmHg] 12 mmHg  VENTILATOR SETTINGS: Vent Mode: CPAP;PSV FiO2 (%):  [40 %] 40 % Set Rate:  [16 bmp] 16 bmp Vt Set:  [460 mL] 460 mL PEEP:  [5 cmH20] 5 cmH20 Pressure Support:  [5 cmH20] 5 cmH20 Plateau Pressure:  [17 cmH20-19 cmH20] 17 cmH20  INTAKE / OUTPUT: I/O last 3 completed shifts: In: 3987.5 [I.V.:3702.5; Other:10; IV Piggyback:275] Out: 6195 [Urine:3635; Emesis/NG output:500]  PHYSICAL EXAMINATION:   General:  Resting comfortably in bed HENT: NCAT ETT in place, tongue swelling noted PULM: CTA B, vent supported breathing CV: RRR, no mgr GI: BS+, ostomy in place, dressing dry MSK: normal bulk and tone Neuro: will wake up to voice, follows commands  LABS:  BMET  Recent Labs Lab 01/26/17 1400 01/27/17 0505 01/28/17 0616  NA 137 139 139  K 4.1 4.2 4.2  CL 98* 101 103  CO2 26 27 25   BUN 60* 70* 89*  CREATININE 2.17* 2.55* 2.89*  GLUCOSE 303* 227* 153*    Electrolytes  Recent Labs Lab 01/26/17 1400 01/27/17 0505 01/28/17 0616  CALCIUM 9.3 9.4 9.1  MG 1.9 1.9  --    PHOS 3.6 3.7  --     CBC  Recent Labs Lab 01/26/17 1400 01/27/17 0505 01/28/17 0616  WBC 15.7* 14.6* 15.4*  HGB 8.5* 8.3* 8.1*  HCT 26.2* 27.2* 26.0*  PLT 330 316 406*    Coag's  Recent Labs Lab 01/26/17 1400  INR 1.17    Sepsis Markers  Recent Labs Lab 01/26/17 1400  LATICACIDVEN 1.7  PROCALCITON 2.44    ABG  Recent Labs Lab 01/26/17 1505  PHART 7.382  PCO2ART 48.4*  PO2ART 93.3    Liver Enzymes  Recent Labs Lab 01/26/17 1400 01/27/17 0505  AST 25 22  ALT 13* 13*  ALKPHOS 133* 131*  BILITOT 0.9 0.8  ALBUMIN 3.6 3.5    Cardiac Enzymes  Recent Labs Lab 01/26/17 1400  TROPONINI <0.03    Glucose  Recent Labs Lab 01/28/17 0555 01/28/17 0653 01/28/17 0757 01/28/17 0913 01/28/17 1017 01/28/17 1131  GLUCAP 149* 151* 151* 146* 146* 151*    Imaging Dg Chest Port 1 View  Result Date: 01/28/2017 CLINICAL DATA:  Evaluate ET tube. EXAM: PORTABLE CHEST 1 VIEW COMPARISON:  Chest radiograph 01/26/2017. FINDINGS: ET tube terminates in the mid trachea left upper extremity PICC line is present with tip coursing toward the azygos vein. Enteric tube courses inferior to the diaphragm. Multi lead AICD device overlies the left hemithorax.  Stable cardiomegaly. Leftward patient rotation. Low lung volumes. Improving bibasilar heterogeneous opacities. Probable small bilateral pleural effusions. IMPRESSION: Low lung volumes with small effusions and basilar opacities which may represent atelectasis and or edema. Overall mid and lower lung opacities are improved when compared to prior. Left upper extremity PICC line present with tip coursing toward the azygos vein. ET tube terminates in the mid trachea. Electronically Signed   By: Lovey Newcomer M.D.   On: 01/28/2017 07:05     STUDIES:  CT abdomen 6/25 > Markedly distended colon, without small bowel dilation. Colonic diverticulosis without diverticulitis or colitis.  CT chest/abd/pelvis 7/3 > Echo 6/28 > LVEF 55  %  CULTURES: Blood 7/5 > Urine  7/5 > yeast Tracheal asp 7/5 >  ANTIBIOTICS: Vancomycin 6/27 > 7/6 Zosyn 6/27 > Flagyl 6/27 > 7/6  SIGNIFICANT EVENTS: 6/25 admit for RLQ pain  6/27 to OR for colitis, partial colectomy and colostomy placed  LINES/TUBES: LUE PICC pre-hosp> ETT 6/27 > Foley pre-hosp>  DISCUSSION: 66 year old female with extensive PMH who presented to [facility] for abdominal wall who was found to have severe colitis and was taken to the OR and left open.  After a week, patient made little to no progress and family requited transfer to [facility] for further care.  ASSESSMENT / PLAN:  PULMONARY A: Acute respiratory failure with hypoxemia in post operative setting> weaning this morning No cuff leak 7/7 P:   Hold extubation Full vent support Decadron for throat swelling VAP prevention Daily SBT/WUA  CARDIOVASCULAR A:  Septic shock resolved Acute on chronic diastolic CHF Hypertriglyceridemia  P:  Tele Diurese again today > increase dosing Restart coreg/ARB when BP allows  RENAL A:   Acute renal failure > worsening but non-oliguric hematuria P:   Flush foley Monitor BMET and UOP Replace electrolytes as needed Give higher dose of lasix today May need renal involvement by tomorrow if no improvement  GASTROINTESTINAL A:   Colitis Nutrition  P:   Wound care per ostomy nurse Tube feeding Hold TPN today  HEMATOLOGIC A:   Anemia P:  Monitor for bleeding Transfuse if Hgb < 7gm/dL  INFECTIOUS A:   Peritonitis P:   Stop antibiotics  ENDOCRINE A:   DM   P:   CBGs Insulin drip > convert to sub cutaneous insulin today  NEUROLOGIC A:   Sedation P:   PAD protocol  Stop versed infusion Continue fentanyl infusion   FAMILY  - Updates: Family updated bedside 7.7  - Inter-disciplinary family meet or Palliative Care meeting due by: 7/11  My cc time 31 minutes  Roselie Awkward, MD [facility] Pager: [phone] Cell:  [phone] After 3pm or if no response, call [phone]   01/28/2017 11:58 AM

## 2017-01-28 NOTE — Progress Notes (Signed)
[facility] Progress Note     Subjective: CC: s/p colectomy with transverse colostomy Patient is intubated, opens eyes to verbal stimulation. Not following commands for me.  Son at bedside.  BP Soft overnight.   Objective: Vital signs in last 24 hours: Temp:  [99.7 F (37.6 C)-100.6 F (38.1 C)] 99.7 F (37.6 C) (07/07 0405) Pulse Rate:  [90-104] 92 (07/07 0800) Resp:  [12-29] 20 (07/07 0800) BP: (82-133)/(39-57) 104/53 (07/07 0800) SpO2:  [94 %-100 %] 96 % (07/07 0800) FiO2 (%):  [40 %] 40 % (07/07 0400) Weight:  [107.8 kg (237 lb 10.5 oz)] 107.8 kg (237 lb 10.5 oz) (07/07 0500) Last BM Date: 01/28/17   Vent Mode: PRVC FiO2 (%):  [40 %] 40 % Set Rate:  [16 bmp] 16 bmp Vt Set:  [460 mL] 460 mL PEEP:  [5 cmH20] 5 cmH20 Plateau Pressure:  [17 cmH20-21 cmH20] 17 cmH20  Intake/Output from previous day: 07/06 0701 - 07/07 0700 In: 2677.1 [I.V.:2402.1; IV Piggyback:275] Out: 2610 [Urine:2210; Emesis/NG output:400] Intake/Output this shift: Total I/O In: 208.9 [I.V.:208.9] Out: -   PE: Gen:  Intubated, NAD Card:  Regular rate and rhythm, pedal pulses 2+ BL Pulm:  Weaning on the vent, vent sounds bilaterally Abd: distended, bowel sounds present. Midline wound clean with interrupted staples present. Ostomy with small amount liquid stool, stoma is brown/dusky but working.  GU: foley present with red-orange urine.  Skin: warm and dry, no rashes  Psych: sedation weaned Neuro: Does not follow commands   Lab Results:   Recent Labs  01/27/17 0505 01/28/17 0616  WBC 14.6* 15.4*  HGB 8.3* 8.1*  HCT 27.2* 26.0*  PLT 316 406*   BMET  Recent Labs  01/27/17 0505 01/28/17 0616  NA 139 139  K 4.2 4.2  CL 101 103  CO2 27 25  GLUCOSE 227* 153*  BUN 70* 89*  CREATININE 2.55* 2.89*  CALCIUM 9.4 9.1   PT/INR  Recent Labs  01/26/17 1400  LABPROT 15.0  INR 1.17   CMP     Component Value Date/Time   NA 139 01/28/2017 0616   K 4.2 01/28/2017 0616    CL 103 01/28/2017 0616   CO2 25 01/28/2017 0616   GLUCOSE 153 (H) 01/28/2017 0616   BUN 89 (H) 01/28/2017 0616   CREATININE 2.89 (H) 01/28/2017 0616   CALCIUM 9.1 01/28/2017 0616   PROT 6.9 01/27/2017 0505   ALBUMIN 3.5 01/27/2017 0505   AST 22 01/27/2017 0505   ALT 13 (L) 01/27/2017 0505   ALKPHOS 131 (H) 01/27/2017 0505   BILITOT 0.8 01/27/2017 0505   GFRNONAA 16 (L) 01/28/2017 0616   GFRAA 18 (L) 01/28/2017 0616   Lipase     Component Value Date/Time   LIPASE 37 01/26/2017 1400    Studies/Results: Ct Abdomen Pelvis Wo Contrast  Result Date: 01/27/2017 CLINICAL DATA:  Patient admitted from outside hospital. Recent subtotal colectomy for fulminant colitis. Hypotension. EXAM: CT ABDOMEN AND PELVIS WITHOUT CONTRAST TECHNIQUE: Multidetector CT imaging of the abdomen and pelvis was performed following the standard protocol without IV contrast. COMPARISON:  None. FINDINGS: Lower chest: Bilateral pleural effusions are associated with bilateral lower lobe collapse/ consolidation. Hepatobiliary: Prominence of the left hepatic lobe with subtle nodular hepatic contour raises the question of cirrhosis. Probable layering tiny stones in the gallbladder. No intrahepatic or extrahepatic biliary dilation. Pancreas: No focal mass lesion. No dilatation of the main duct. No intraparenchymal cyst. No peripancreatic edema. Spleen: No splenomegaly. No focal mass lesion.  Adrenals/Urinary Tract: 2.1 cm right adrenal nodule with average attenuation of 7 Hounsfield units is compatible with adenoma. Left adrenal gland unremarkable. No focal abnormality is seen in either kidney on this noncontrast exam. No hydronephrosis. No evidence for hydroureter. Urinary bladder is decompressed by a Foley catheter and gas in the bladder lumen is compatible with the instrumentation. Stomach/Bowel: NG tube tip is in the antral stomach. Duodenum is normally positioned as is the ligament of Treitz. Small bowel loops are nondilated.  No wall thickening in the terminal ileum. Appendix unremarkable. Patient is status post right lower quadrant transverse and colostomy. Long Hartmann's pouch noted. Vascular/Lymphatic: There is abdominal aortic atherosclerosis without aneurysm. There is no gastrohepatic or hepatoduodenal ligament lymphadenopathy. No intraperitoneal or retroperitoneal lymphadenopathy. No pelvic sidewall lymphadenopathy. Reproductive: Uterus surgically absent.  There is no adnexal mass. Other: Small to moderate volume free fluid evident. There is some dependent debris or peritoneal thickening in the posterior pelvic cul-de-sac. No definite focal or organized fluid collection in the peritoneal cavity to suggest abscess on this noncontrast exam. Musculoskeletal: Diffuse body wall edema noted. Midline surgical wound appears open caudally. Bone windows reveal no worrisome lytic or sclerotic osseous lesions. IMPRESSION: 1. Status post subtotal colectomy with right abdominal proximal transverse and colostomy and Hartman's pouch. 2. Small to moderate volume fluid in the peritoneal cavity without organized collection to suggest a discrete abscess. There is some increased attenuation along the posterior peritoneal lining of the cul-de-sac which may be related to layering debris or hemorrhage. 3. Probable cholelithiasis. 4.  Aortic Atherosclerois (ICD10-170.0) 5. NG tube tip is in the distal stomach. 6. Diffuse body wall edema with caudal portion of the midline wound apparently open. Electronically Signed   By: Misty Stanley M.D.   On: 01/27/2017 11:14   Dg Chest Port 1 View  Result Date: 01/28/2017 CLINICAL DATA:  Evaluate ET tube. EXAM: PORTABLE CHEST 1 VIEW COMPARISON:  Chest radiograph 01/26/2017. FINDINGS: ET tube terminates in the mid trachea left upper extremity PICC line is present with tip coursing toward the azygos vein. Enteric tube courses inferior to the diaphragm. Multi lead AICD device overlies the left hemithorax. Stable  cardiomegaly. Leftward patient rotation. Low lung volumes. Improving bibasilar heterogeneous opacities. Probable small bilateral pleural effusions. IMPRESSION: Low lung volumes with small effusions and basilar opacities which may represent atelectasis and or edema. Overall mid and lower lung opacities are improved when compared to prior. Left upper extremity PICC line present with tip coursing toward the azygos vein. ET tube terminates in the mid trachea. Electronically Signed   By: Lovey Newcomer M.D.   On: 01/28/2017 07:05   Dg Chest Port 1 View  Result Date: 01/26/2017 CLINICAL DATA:  PICC line placement EXAM: PORTABLE CHEST 1 VIEW COMPARISON:  Earlier the same day FINDINGS: 2002 hours. Endotracheal tube tip is approximately 3.5 cm above the base of the carina. The NG tube passes into the stomach although the distal tip position is not included on the film. A left-sided PICC line is evident but somewhat difficult to see given the superimposed pacer/AICD wires and poor opacity of the PICC line. The tip appears to be overlying the spine at approximately the expected level of mid SVC. The catheter is following the same course as the pacer leads appear similar to the prior study. The cardio pericardial silhouette is enlarged. Pulmonary vascular congestion noted with interval progression of bibasilar collapse/consolidation and probable bilateral pleural effusions. IMPRESSION: Left PICC line tip is in a position overlying the mid  SVC, the following a similar course as the pacer wires. Interval progression of bibasilar collapse/ consolidation. Electronically Signed   By: Misty Stanley M.D.   On: 01/26/2017 20:42   Dg Chest Port 1 View  Result Date: 01/26/2017 CLINICAL DATA:  Endotracheal tube placement EXAM: PORTABLE CHEST 1 VIEW COMPARISON:  01/30/2014 FINDINGS: Endotracheal tube with tip at the clavicular heads. An orogastric tube reaches the stomach. Chronic cardiopericardial enlargement. Vascular pedicle  widening. These changes are progressed from prior in the setting of semi erect position. Two right-sided transvenous pacer leads and a left-sided direct epicardial pacer. Lead positioning is similar to prior. There is pulmonary edema and likely small pleural effusions. No air bronchogram. IMPRESSION: 1. Unremarkable positioning of endotracheal and orogastric tubes. 2. Layering pleural effusions and probable pulmonary edema. Chronic cardiomegaly. 3. Low volume chest. Electronically Signed   By: Monte Fantasia M.D.   On: 01/26/2017 15:16    Anti-infectives: Anti-infectives    Start     Dose/Rate Route Frequency Ordered Stop   01/27/17 1000  metroNIDAZOLE (FLAGYL) IVPB 500 mg     500 mg 100 mL/hr over 60 Minutes Intravenous Every 8 hours 01/27/17 0925     01/26/17 1600  vancomycin (VANCOCIN) IVPB 1000 mg/200 mL premix  Status:  Discontinued     1,000 mg 200 mL/hr over 60 Minutes Intravenous Every 24 hours 01/26/17 1456 01/27/17 1229   01/26/17 1600  piperacillin-tazobactam (ZOSYN) IVPB 3.375 g     3.375 g 12.5 mL/hr over 240 Minutes Intravenous Every 8 hours 01/26/17 1456     01/26/17 1430  metroNIDAZOLE (FLAGYL) IVPB 500 mg  Status:  Discontinued     500 mg 100 mL/hr over 60 Minutes Intravenous Every 8 hours 01/26/17 1421 01/26/17 1427       Assessment/Plan 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 Matt Holmes, 01/18/17 [facility] - Open midline incision: Wet-to-dry dressing changes - Ostomy: WOC applied foam dressings to protect skin and promote healing where MARSI has occurred. Stoma is dark/dusky but viable currently - Patient  distended, there is output in ostomy bag. Bowel sounds more active today.  - NGT with 400 cc output overnight - continue on LIWS - WBC 15.4 from 14.6 yesterday, Tmax 100.6  - CT yesterday - small to mod volume fluid in peritoneal cavity, but no abscess. Increased attenuation along posterior peritoneal lining of cul-de-sac, may be related to  layering debris or hemorrhage. Septic shock Acute renal failure- Cr 2.89 from 2.55 yesterday, could consider nephrology consult.  Acute on chronic diastolic CHF Acute respiratory failure w/ hypoxemia in post-op setting - weaning on vent Anemia - H/H 8.1/26.0 DM  FEN - NPO/IVF, TPN VTE - SQ heparin ID - flagyl (7/6>>), zosyn (7/5>>), vanco (7/5>>)  Plan: Continue wet to dry dressing changes BID. Continue ostomy care. Continue IV abx. Consider nephrology consult for worsening kidney function.   LOS: 2 days    [name] , [facility] 01/28/2017, 8:22 AM Pager: [phone] Consults: [phone] Mon-Fri 7:00 am-4:30 pm Sat-Sun 7:00 am-11:30 am

## 2017-01-29 ENCOUNTER — Inpatient Hospital Stay (HOSPITAL_COMMUNITY): Payer: Medicare Other

## 2017-01-29 DIAGNOSIS — E875 Hyperkalemia: Secondary | ICD-10-CM

## 2017-01-29 LAB — GLUCOSE, CAPILLARY
GLUCOSE-CAPILLARY: 372 mg/dL — AB (ref 65–99)
GLUCOSE-CAPILLARY: 366 mg/dL — AB (ref 65–99)
GLUCOSE-CAPILLARY: 325 mg/dL — AB (ref 65–99)
GLUCOSE-CAPILLARY: 281 mg/dL — AB (ref 65–99)
GLUCOSE-CAPILLARY: 188 mg/dL — AB (ref 65–99)
GLUCOSE-CAPILLARY: 153 mg/dL — AB (ref 65–99)
GLUCOSE-CAPILLARY: 173 mg/dL — AB (ref 65–99)
GLUCOSE-CAPILLARY: 145 mg/dL — AB (ref 65–99)
GLUCOSE-CAPILLARY: 149 mg/dL — AB (ref 65–99)
Glucose-Capillary: 414 mg/dL — ABNORMAL HIGH (ref 65–99)
Glucose-Capillary: 405 mg/dL — ABNORMAL HIGH (ref 65–99)
Glucose-Capillary: 244 mg/dL — ABNORMAL HIGH (ref 65–99)
Glucose-Capillary: 216 mg/dL — ABNORMAL HIGH (ref 65–99)
Glucose-Capillary: 214 mg/dL — ABNORMAL HIGH (ref 65–99)
Glucose-Capillary: 179 mg/dL — ABNORMAL HIGH (ref 65–99)
Glucose-Capillary: 167 mg/dL — ABNORMAL HIGH (ref 65–99)
Glucose-Capillary: 162 mg/dL — ABNORMAL HIGH (ref 65–99)
Glucose-Capillary: 146 mg/dL — ABNORMAL HIGH (ref 65–99)
Glucose-Capillary: 149 mg/dL — ABNORMAL HIGH (ref 65–99)
Glucose-Capillary: 141 mg/dL — ABNORMAL HIGH (ref 65–99)
Glucose-Capillary: 131 mg/dL — ABNORMAL HIGH (ref 65–99)
Glucose-Capillary: 130 mg/dL — ABNORMAL HIGH (ref 65–99)

## 2017-01-29 LAB — CBC WITH DIFFERENTIAL/PLATELET
BASOS ABS: 0 10*3/uL (ref 0.0–0.1)
BASOS PCT: 0 %
Eosinophils Absolute: 0 10*3/uL (ref 0.0–0.7)
Eosinophils Relative: 0 %
HEMATOCRIT: 29.1 % — AB (ref 36.0–46.0)
Hemoglobin: 9 g/dL — ABNORMAL LOW (ref 12.0–15.0)
Lymphocytes Relative: 9 %
Lymphs Abs: 1.6 10*3/uL (ref 0.7–4.0)
MCH: 27.3 pg (ref 26.0–34.0)
MCHC: 30.9 g/dL (ref 30.0–36.0)
MCV: 88.2 fL (ref 78.0–100.0)
MONO ABS: 0.6 10*3/uL (ref 0.1–1.0)
Monocytes Relative: 3 %
NEUTROS ABS: 14.5 10*3/uL — AB (ref 1.7–7.7)
NEUTROS PCT: 88 %
Platelets: 522 10*3/uL — ABNORMAL HIGH (ref 150–400)
RBC: 3.3 MIL/uL — ABNORMAL LOW (ref 3.87–5.11)
RDW: 17 % — AB (ref 11.5–15.5)
WBC: 16.7 10*3/uL — ABNORMAL HIGH (ref 4.0–10.5)

## 2017-01-29 LAB — BASIC METABOLIC PANEL
ANION GAP: 18 — AB (ref 5–15)
Anion gap: 16 — ABNORMAL HIGH (ref 5–15)
BUN: 111 mg/dL — ABNORMAL HIGH (ref 6–20)
BUN: 121 mg/dL — AB (ref 6–20)
CALCIUM: 9.3 mg/dL (ref 8.9–10.3)
CALCIUM: 9.6 mg/dL (ref 8.9–10.3)
CO2: 24 mmol/L (ref 22–32)
CO2: 27 mmol/L (ref 22–32)
CREATININE: 2.96 mg/dL — AB (ref 0.44–1.00)
Chloride: 96 mmol/L — ABNORMAL LOW (ref 101–111)
Chloride: 98 mmol/L — ABNORMAL LOW (ref 101–111)
Creatinine, Ser: 2.97 mg/dL — ABNORMAL HIGH (ref 0.44–1.00)
GFR calc Af Amer: 18 mL/min — ABNORMAL LOW (ref >60)
GFR, EST AFRICAN AMERICAN: 18 mL/min — AB (ref >60)
GFR, EST NON AFRICAN AMERICAN: 15 mL/min — AB (ref >60)
GFR, EST NON AFRICAN AMERICAN: 15 mL/min — AB (ref >60)
GLUCOSE: 408 mg/dL — AB (ref 65–99)
GLUCOSE: 162 mg/dL — AB (ref 65–99)
POTASSIUM: 4.5 mmol/L (ref 3.5–5.1)
Potassium: 5.2 mmol/L — ABNORMAL HIGH (ref 3.5–5.1)
Sodium: 138 mmol/L (ref 135–145)
Sodium: 141 mmol/L (ref 135–145)

## 2017-01-29 LAB — CULTURE, RESPIRATORY

## 2017-01-29 LAB — MAGNESIUM
MAGNESIUM: 2.4 mg/dL (ref 1.7–2.4)
Magnesium: 2.5 mg/dL — ABNORMAL HIGH (ref 1.7–2.4)

## 2017-01-29 LAB — CULTURE, RESPIRATORY W GRAM STAIN

## 2017-01-29 LAB — PHOSPHORUS
PHOSPHORUS: 5.9 mg/dL — AB (ref 2.5–4.6)
PHOSPHORUS: 4.7 mg/dL — AB (ref 2.5–4.6)

## 2017-01-29 MED ORDER — CARVEDILOL 6.25 MG PO TABS
6.2500 mg | ORAL_TABLET | Freq: Two times a day (BID) | ORAL | Status: DC
  Administered 2017-01-29 – 2017-02-06 (×16): 6.25 mg via ORAL
  Filled 2017-01-29 (×16): qty 1

## 2017-01-29 MED ORDER — ORAL CARE MOUTH RINSE
15.0000 mL | Freq: Two times a day (BID) | OROMUCOSAL | Status: DC
  Administered 2017-01-29 – 2017-01-30 (×3): 15 mL via OROMUCOSAL

## 2017-01-29 MED ORDER — ONDANSETRON HCL 4 MG/2ML IJ SOLN
4.0000 mg | Freq: Once | INTRAMUSCULAR | Status: AC
  Administered 2017-01-29: 4 mg via INTRAVENOUS
  Filled 2017-01-29: qty 2

## 2017-01-29 MED ORDER — WHITE PETROLATUM GEL
Status: AC
  Administered 2017-01-29: 1
  Filled 2017-01-29: qty 1

## 2017-01-29 MED ORDER — FENTANYL CITRATE (PF) 100 MCG/2ML IJ SOLN
12.5000 ug | INTRAMUSCULAR | Status: DC | PRN
  Administered 2017-01-30 – 2017-02-05 (×7): 25 ug via INTRAVENOUS
  Filled 2017-01-29 (×7): qty 2

## 2017-01-29 MED ORDER — SODIUM CHLORIDE 0.9 % IV SOLN
INTRAVENOUS | Status: DC
  Administered 2017-01-29: 3.5 [IU]/h via INTRAVENOUS
  Administered 2017-01-29: 12.8 [IU]/h via INTRAVENOUS
  Filled 2017-01-29 (×3): qty 1

## 2017-01-29 MED ORDER — FUROSEMIDE 10 MG/ML IJ SOLN
120.0000 mg | Freq: Four times a day (QID) | INTRAVENOUS | Status: AC
  Administered 2017-01-29 (×2): 120 mg via INTRAVENOUS
  Filled 2017-01-29: qty 12
  Filled 2017-01-29: qty 10
  Filled 2017-01-29: qty 12

## 2017-01-29 NOTE — Progress Notes (Signed)
[facility] Progress Note     Subjective: CC: s/p colectomy with transverse colostomy in [place] Patient intubated, opens eyes, nods answers to questions, follows commands. Denies abdominal pain. Had episode of large volume emesis with TF last night, tolerating TF currently. Increased output from ostomy, but some leaking around bottom of device.   Objective: Vital signs in last 24 hours: Temp:  [98.6 F (37 C)-99.8 F (37.7 C)] 99.3 F (37.4 C) (07/08 0745) Pulse Rate:  [89-107] 95 (07/08 0805) Resp:  [9-26] 19 (07/08 0805) BP: (81-136)/(40-66) 109/54 (07/08 0805) SpO2:  [89 %-97 %] 94 % (07/08 0805) FiO2 (%):  [40 %] 40 % (07/08 0805) Weight:  [104.6 kg (230 lb 9.6 oz)] 104.6 kg (230 lb 9.6 oz) (07/08 0450) Last BM Date: 01/28/17   Vent Mode: CPAP;PSV FiO2 (%):  [40 %] 40 % Set Rate:  [16 bmp] 16 bmp Vt Set:  [460 mL] 460 mL PEEP:  [5 cmH20] 5 cmH20 Pressure Support:  [5 cmH20] 5 cmH20 Plateau Pressure:  [18 cmH20-22 cmH20] 18 cmH20  Intake/Output from previous day: 07/07 0701 - 07/08 0700 In: 1657.8 [I.V.:738.8; NG/GT:747; IV Piggyback:162] Out: 2595 [Urine:4180; Emesis/NG output:200; Stool:310] Intake/Output this shift: No intake/output data recorded.  PE: Gen:  Alert, NAD, intubated Card:  Regular rate and rhythm, no M/G/R Pulm:  Initiating breaths on the vent, clear to auscultation bilaterally Abd: Soft, non-tender, moderately distended, bowel sounds present. Midline wound clean with interrupted staples. Right ostomy with soft brown stool, some leakage from inferior aspect of device, stoma is dusky but viable.  Skin: warm and dry, no rashes  Neuro: following commands   Lab Results:   Recent Labs  01/28/17 0616 01/29/17 0312  WBC 15.4* 16.7*  HGB 8.1* 9.0*  HCT 26.0* 29.1*  PLT 406* 522*   BMET  Recent Labs  01/28/17 0616 01/29/17 0312  NA 139 138  K 4.2 5.2*  CL 103 96*  CO2 25 24  GLUCOSE 153* 408*  BUN 89* 111*  CREATININE  2.89* 2.97*  CALCIUM 9.1 9.3   PT/INR  Recent Labs  01/26/17 1400  LABPROT 15.0  INR 1.17   CMP     Component Value Date/Time   NA 138 01/29/2017 0312   K 5.2 (H) 01/29/2017 0312   CL 96 (L) 01/29/2017 0312   CO2 24 01/29/2017 0312   GLUCOSE 408 (H) 01/29/2017 0312   BUN 111 (H) 01/29/2017 0312   CREATININE 2.97 (H) 01/29/2017 0312   CALCIUM 9.3 01/29/2017 0312   PROT 6.9 01/27/2017 0505   ALBUMIN 3.5 01/27/2017 0505   AST 22 01/27/2017 0505   ALT 13 (L) 01/27/2017 0505   ALKPHOS 131 (H) 01/27/2017 0505   BILITOT 0.8 01/27/2017 0505   GFRNONAA 15 (L) 01/29/2017 0312   GFRAA 18 (L) 01/29/2017 0312   Lipase     Component Value Date/Time   LIPASE 37 01/26/2017 1400     Studies/Results: Ct Abdomen Pelvis Wo Contrast  Result Date: 01/27/2017 CLINICAL DATA:  Patient admitted from outside hospital. Recent subtotal colectomy for fulminant colitis. Hypotension. EXAM: CT ABDOMEN AND PELVIS WITHOUT CONTRAST TECHNIQUE: Multidetector CT imaging of the abdomen and pelvis was performed following the standard protocol without IV contrast. COMPARISON:  None. FINDINGS: Lower chest: Bilateral pleural effusions are associated with bilateral lower lobe collapse/ consolidation. Hepatobiliary: Prominence of the left hepatic lobe with subtle nodular hepatic contour raises the question of cirrhosis. Probable layering tiny stones in the gallbladder. No intrahepatic or extrahepatic biliary dilation.  Pancreas: No focal mass lesion. No dilatation of the main duct. No intraparenchymal cyst. No peripancreatic edema. Spleen: No splenomegaly. No focal mass lesion. Adrenals/Urinary Tract: 2.1 cm right adrenal nodule with average attenuation of 7 Hounsfield units is compatible with adenoma. Left adrenal gland unremarkable. No focal abnormality is seen in either kidney on this noncontrast exam. No hydronephrosis. No evidence for hydroureter. Urinary bladder is decompressed by a Foley catheter and gas in the  bladder lumen is compatible with the instrumentation. Stomach/Bowel: NG tube tip is in the antral stomach. Duodenum is normally positioned as is the ligament of Treitz. Small bowel loops are nondilated. No wall thickening in the terminal ileum. Appendix unremarkable. Patient is status post right lower quadrant transverse and colostomy. Long Hartmann's pouch noted. Vascular/Lymphatic: There is abdominal aortic atherosclerosis without aneurysm. There is no gastrohepatic or hepatoduodenal ligament lymphadenopathy. No intraperitoneal or retroperitoneal lymphadenopathy. No pelvic sidewall lymphadenopathy. Reproductive: Uterus surgically absent.  There is no adnexal mass. Other: Small to moderate volume free fluid evident. There is some dependent debris or peritoneal thickening in the posterior pelvic cul-de-sac. No definite focal or organized fluid collection in the peritoneal cavity to suggest abscess on this noncontrast exam. Musculoskeletal: Diffuse body wall edema noted. Midline surgical wound appears open caudally. Bone windows reveal no worrisome lytic or sclerotic osseous lesions. IMPRESSION: 1. Status post subtotal colectomy with right abdominal proximal transverse and colostomy and Hartman's pouch. 2. Small to moderate volume fluid in the peritoneal cavity without organized collection to suggest a discrete abscess. There is some increased attenuation along the posterior peritoneal lining of the cul-de-sac which may be related to layering debris or hemorrhage. 3. Probable cholelithiasis. 4.  Aortic Atherosclerois (ICD10-170.0) 5. NG tube tip is in the distal stomach. 6. Diffuse body wall edema with caudal portion of the midline wound apparently open. Electronically Signed   By: Misty Stanley M.D.   On: 01/27/2017 11:14   Dg Chest Port 1 View  Result Date: 01/29/2017 CLINICAL DATA:  Respiratory failure. EXAM: PORTABLE CHEST 1 VIEW COMPARISON:  January 28, 2017 FINDINGS: The ETT is in good position. The right PICC  line is stable. No pneumothorax. Stable cardiomegaly. Support apparatus partially obscures the right base. Bibasilar effusions and possible tiny left effusion are identified. Pulmonary venous congestion remains. IMPRESSION: Support apparatus is in good position. Pulmonary venous congestion. Bibasilar opacities, left greater than right, are unchanged. Electronically Signed   By: Dorise Bullion III M.D   On: 01/29/2017 06:45   Dg Chest Port 1 View  Result Date: 01/28/2017 CLINICAL DATA:  Evaluate ET tube. EXAM: PORTABLE CHEST 1 VIEW COMPARISON:  Chest radiograph 01/26/2017. FINDINGS: ET tube terminates in the mid trachea left upper extremity PICC line is present with tip coursing toward the azygos vein. Enteric tube courses inferior to the diaphragm. Multi lead AICD device overlies the left hemithorax. Stable cardiomegaly. Leftward patient rotation. Low lung volumes. Improving bibasilar heterogeneous opacities. Probable small bilateral pleural effusions. IMPRESSION: Low lung volumes with small effusions and basilar opacities which may represent atelectasis and or edema. Overall mid and lower lung opacities are improved when compared to prior. Left upper extremity PICC line present with tip coursing toward the azygos vein. ET tube terminates in the mid trachea. Electronically Signed   By: Lovey Newcomer M.D.   On: 01/28/2017 07:05    Anti-infectives: Anti-infectives    Start     Dose/Rate Route Frequency Ordered Stop   01/27/17 1000  metroNIDAZOLE (FLAGYL) IVPB 500 mg  Status:  Discontinued     500 mg 100 mL/hr over 60 Minutes Intravenous Every 8 hours 01/27/17 0925 01/28/17 1229   01/26/17 1600  vancomycin (VANCOCIN) IVPB 1000 mg/200 mL premix  Status:  Discontinued     1,000 mg 200 mL/hr over 60 Minutes Intravenous Every 24 hours 01/26/17 1456 01/27/17 1229   01/26/17 1600  piperacillin-tazobactam (ZOSYN) IVPB 3.375 g  Status:  Discontinued     3.375 g 12.5 mL/hr over 240 Minutes Intravenous Every 8  hours 01/26/17 1456 01/28/17 1204   01/26/17 1430  metroNIDAZOLE (FLAGYL) IVPB 500 mg  Status:  Discontinued     500 mg 100 mL/hr over 60 Minutes Intravenous Every 8 hours 01/26/17 1421 01/26/17 1427       Assessment/Plan 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 Matt Holmes, 01/18/17 [facility] - Open midline incision: Wet-to-dry dressing changes - Ostomy: WOC applied foam dressings to protect skin and promote healing where MARSI has occurred. Stoma is dark/dusky but viable currently - Patient  distended, increased output in ostomy bag. Bowel sounds present.  - NGT with 200 cc output overnight after emesis with TF - continue TF, if more emesis reconnect to wall suction  - WBC 16.7 from 15.4 yesterday, afebrile  - CT 7/6 - small to mod volume fluid in peritoneal cavity, but no abscess. Increased attenuation along posterior peritoneal lining of cul-de-sac, may be related to layering debris or hemorrhage. Septic shock Acute renal failure- Cr 2.97 from 2.89 yesterday, consider nephrology consult.  Acute on chronic diastolic CHF Acute respiratory failure w/ hypoxemia in post-op setting - weaning on vent, possible extubation?  Anemia- H/H 9.0/29.1, stable DM  FEN- NPO/IVF, TPN. K 5.2, could give 1 time dose kayexalate?  VTE- SQ heparin ID- flagyl (7/6>>), zosyn (7/5>>), vanco (7/5>>)  Plan: Continue wet to dry dressing changes BID. Continue ostomy care. Continue IV abx. Urine Cx and respiratory Cx show yeast, patient is on flagyl already.  LOS: 3 days    [name] , [facility] Surgery 01/29/2017, 8:52 AM Pager: [phone] Consults: [phone] Mon-Fri 7:00 am-4:30 pm Sat-Sun 7:00 am-11:30 am

## 2017-01-29 NOTE — Progress Notes (Signed)
Pt has a positive cuff leak.

## 2017-01-29 NOTE — Progress Notes (Signed)
PULMONARY / CRITICAL CARE MEDICINE   Name: Crystal [name] MRN: [id number] DOB: [birth year]    ADMISSION DATE:  01/26/2017 (admit to [facility] 6/27) CONSULTATION DATE:  7/5  REFERRING MD:  Dr. Jens Som  CHIEF COMPLAINT:  Septic shock, colitis, VDRF  HISTORY OF PRESENT ILLNESS:   66 year old female with PMH a significant for CHF with defibrillator in place, CKD, DM, OSA on CPAP, COPD, and Hypothyroidism. Admitted 6/25 to [facility] for fulminant colitis requiring partial colectomy and colostomy. Abdomen left open and left on vent. Slow to improve and wean from vent. Grossly volume overloaded. Transferred to [facility] 7/5.  SUBJECTIVE:  Diuresing well Has cuff leak Passing SBT Vomited overnight  VITAL SIGNS: BP (!) 120/50   Pulse 93   Temp 99.3 F (37.4 C) (Oral)   Resp 20   Ht 5\' 5"  (1.651 m)   Wt 104.6 kg (230 lb 9.6 oz)   SpO2 91%   BMI 38.37 kg/m   HEMODYNAMICS: CVP:  [12 mmHg-16 mmHg] 15 mmHg  VENTILATOR SETTINGS: Vent Mode: CPAP;PSV FiO2 (%):  [40 %] 40 % Set Rate:  [16 bmp] 16 bmp Vt Set:  [460 mL] 460 mL PEEP:  [5 cmH20] 5 cmH20 Pressure Support:  [5 cmH20] 5 cmH20 Plateau Pressure:  [18 cmH20-22 cmH20] 18 cmH20  INTAKE / OUTPUT: I/O last 3 completed shifts: In: 2913.7 [I.V.:1894.7; Other:10; NG/GT:747; IV Piggyback:262] Out: 2229 [NLGXQ:1194; Emesis/NG output:600; Stool:310]  PHYSICAL EXAMINATION:   General:  In bed on vent HENT: NCAT ETT in place PULM: CTA B, vent supported breathing CV: RRR, no mgr GI: midline dressing clean, dry, bowel sounds noted, ostomy intact MSK: normal bulk and tone Neuro: Awake on vent, follows commands    LABS:  BMET  Recent Labs Lab 01/27/17 0505 01/28/17 0616 01/29/17 0312  NA 139 139 138  K 4.2 4.2 5.2*  CL 101 103 96*  CO2 27 25 24   BUN 70* 89* 111*  CREATININE 2.55* 2.89* 2.97*  GLUCOSE 227* 153* 408*    Electrolytes  Recent Labs Lab 01/27/17 0505 01/28/17 0616 01/28/17 1224 01/28/17 1616  01/29/17 0312  CALCIUM 9.4 9.1  --   --  9.3  MG 1.9  --  2.2 2.2 2.4  PHOS 3.7  --  5.3* 5.4* 5.9*    CBC  Recent Labs Lab 01/27/17 0505 01/28/17 0616 01/29/17 0312  WBC 14.6* 15.4* 16.7*  HGB 8.3* 8.1* 9.0*  HCT 27.2* 26.0* 29.1*  PLT 316 406* 522*    Coag's  Recent Labs Lab 01/26/17 1400  INR 1.17    Sepsis Markers  Recent Labs Lab 01/26/17 1400  LATICACIDVEN 1.7  PROCALCITON 2.44    ABG  Recent Labs Lab 01/26/17 1505  PHART 7.382  PCO2ART 48.4*  PO2ART 93.3    Liver Enzymes  Recent Labs Lab 01/26/17 1400 01/27/17 0505  AST 25 22  ALT 13* 13*  ALKPHOS 133* 131*  BILITOT 0.9 0.8  ALBUMIN 3.6 3.5    Cardiac Enzymes  Recent Labs Lab 01/26/17 1400  TROPONINI <0.03    Glucose  Recent Labs Lab 01/29/17 0448 01/29/17 0552 01/29/17 0651 01/29/17 0753 01/29/17 0856 01/29/17 1001  GLUCAP 325* 281* 244* 216* 214* 188*    Imaging Dg Chest Port 1 View  Result Date: 01/29/2017 CLINICAL DATA:  Respiratory failure. EXAM: PORTABLE CHEST 1 VIEW COMPARISON:  January 28, 2017 FINDINGS: The ETT is in good position. The right PICC line is stable. No pneumothorax. Stable cardiomegaly. Support apparatus partially obscures the right  base. Bibasilar effusions and possible tiny left effusion are identified. Pulmonary venous congestion remains. IMPRESSION: Support apparatus is in good position. Pulmonary venous congestion. Bibasilar opacities, left greater than right, are unchanged. Electronically Signed   By: Dorise Bullion III M.D   On: 01/29/2017 06:45     STUDIES:  CT abdomen 6/25 > Markedly distended colon, without small bowel dilation. Colonic diverticulosis without diverticulitis or colitis.  CT chest/abd/pelvis 7/6 > s/p subtotal colectomy and Hartman's pouch, some fluid in abdomen but not consistent with abscess, cholelithiasis noted, NG in stomach, diffuse body wall edema  Echo 6/28 > LVEF 55 %  CULTURES: Blood 7/5 > Urine  7/5 >  candida Tracheal asp 7/5 > candida   ANTIBIOTICS: Vancomycin 6/27 > 7/6 Zosyn 6/27 >  Flagyl 6/27 > 7/6  SIGNIFICANT EVENTS: 6/25 admit for RLQ pain  6/27 to OR for colitis, partial colectomy and colostomy placed  LINES/TUBES: LUE PICC pre-hosp> ETT 6/27 > Foley pre-hosp>  DISCUSSION: 66 year old female with extensive PMH who presented to [facility] for abdominal wall who was found to have severe colitis and was taken to the OR and left open.  After a week, patient made little to no progress and family requited transfer to [facility] for further care.  As of 7/8 her respiratory status has improved, she is off pressors.    ASSESSMENT / PLAN:  PULMONARY A: Acute respiratory failure with hypoxemia in post operative setting> weaning this morning Has cuff leak 7/8 P:   Extubate Mouth care post extubation Stop decadron  CARDIOVASCULAR A:  Septic shock resolved Acute on chronic diastolic CHF Hypertriglyceridemia  P:  Tele Diurese again today Continue to hold ARB Restart coreg 1/2 home dose  RENAL A:   Acute renal failure > mildly worse numbers, BUN up some likely due to decadron Hypernatremia> mild P:   Monitor BMET and UOP Repeat BMET 7/8 for potassium Replace electrolytes as needed Continue diuresis Consider consulting renal 7/8 if no improvement or oliguric  GASTROINTESTINAL A:   Colitis Nutrition  Vomiting overnight S/p partial colectomy and ostomy  P:   Wound care per surgery Hold tube feedings for extubation Advance oral intake if doing well later today  HEMATOLOGIC A:   Anemia P:  Monitor for bleeding Transfuse if Hgb < 7gm/dL  INFECTIOUS A:   Peritonitis At risk for disseminated candidiasis WBC up some, presumed due to steroids as she is doing well otherwise P:   Stop antibiotics Low threshold to remove OSH PICC Low threshold to start systemic antifungal coverage if febrile  ENDOCRINE A:   DM   Persistent hyperglycemia P:    CBGs Continue insulin drip today  NEUROLOGIC A:   No acute issues P:   Stop sedation protocol Use prn fentanyl for pain   FAMILY  - Updates: Family updated bedside 7/8 by me  - Inter-disciplinary family meet or Palliative Care meeting due by: 7/11  My cc time 33 minutes  Roselie Awkward, MD [place] P[facility] Pager: [phone] Cell: [phone] After 3pm or if no response, call [phone]   01/29/2017 10:27 AM

## 2017-01-29 NOTE — Procedures (Signed)
Extubation Procedure Note  Patient Details:   Name: Crystal [name] DOB: 09-12-1950 MRN: [id number]   Airway Documentation:     Evaluation  O2 sats: stable throughout Complications: No apparent complications Patient did tolerate procedure well. Bilateral Breath Sounds: Clear, Diminished   Yes   Positive cuff leak noted.  Pt placed on [place] 6 Lpm with humidity, no stridor noted. Pt able to reach 500 using IS.  Sat gaol per MD 88% and above.  [place] Amber Khylee Algeo 01/29/2017, 10:31 AM

## 2017-01-30 LAB — GLUCOSE, CAPILLARY
GLUCOSE-CAPILLARY: 142 mg/dL — AB (ref 65–99)
GLUCOSE-CAPILLARY: 134 mg/dL — AB (ref 65–99)
GLUCOSE-CAPILLARY: 235 mg/dL — AB (ref 65–99)
Glucose-Capillary: 134 mg/dL — ABNORMAL HIGH (ref 65–99)
Glucose-Capillary: 150 mg/dL — ABNORMAL HIGH (ref 65–99)
Glucose-Capillary: 168 mg/dL — ABNORMAL HIGH (ref 65–99)
Glucose-Capillary: 194 mg/dL — ABNORMAL HIGH (ref 65–99)
Glucose-Capillary: 240 mg/dL — ABNORMAL HIGH (ref 65–99)
Glucose-Capillary: 297 mg/dL — ABNORMAL HIGH (ref 65–99)

## 2017-01-30 LAB — CBC WITH DIFFERENTIAL/PLATELET
Basophils Absolute: 0 10*3/uL (ref 0.0–0.1)
Basophils Relative: 0 %
EOS PCT: 0 %
Eosinophils Absolute: 0 10*3/uL (ref 0.0–0.7)
HCT: 28.5 % — ABNORMAL LOW (ref 36.0–46.0)
Hemoglobin: 8.9 g/dL — ABNORMAL LOW (ref 12.0–15.0)
LYMPHS ABS: 1.7 10*3/uL (ref 0.7–4.0)
LYMPHS PCT: 12 %
MCH: 27.3 pg (ref 26.0–34.0)
MCHC: 31.2 g/dL (ref 30.0–36.0)
MCV: 87.4 fL (ref 78.0–100.0)
MONO ABS: 1.3 10*3/uL — AB (ref 0.1–1.0)
Monocytes Relative: 9 %
Neutro Abs: 10.8 10*3/uL — ABNORMAL HIGH (ref 1.7–7.7)
Neutrophils Relative %: 79 %
PLATELETS: 538 10*3/uL — AB (ref 150–400)
RBC: 3.26 MIL/uL — ABNORMAL LOW (ref 3.87–5.11)
RDW: 16.7 % — AB (ref 11.5–15.5)
WBC: 13.9 10*3/uL — ABNORMAL HIGH (ref 4.0–10.5)

## 2017-01-30 LAB — BASIC METABOLIC PANEL
Anion gap: 16 — ABNORMAL HIGH (ref 5–15)
BUN: 128 mg/dL — AB (ref 6–20)
CO2: 28 mmol/L (ref 22–32)
Calcium: 9.4 mg/dL (ref 8.9–10.3)
Chloride: 99 mmol/L — ABNORMAL LOW (ref 101–111)
Creatinine, Ser: 2.73 mg/dL — ABNORMAL HIGH (ref 0.44–1.00)
GFR calc Af Amer: 20 mL/min — ABNORMAL LOW (ref >60)
GFR, EST NON AFRICAN AMERICAN: 17 mL/min — AB (ref >60)
GLUCOSE: 135 mg/dL — AB (ref 65–99)
POTASSIUM: 4.2 mmol/L (ref 3.5–5.1)
Sodium: 143 mmol/L (ref 135–145)

## 2017-01-30 MED ORDER — INSULIN ASPART 100 UNIT/ML ~~LOC~~ SOLN
0.0000 [IU] | SUBCUTANEOUS | Status: DC
  Administered 2017-01-30: 8 [IU] via SUBCUTANEOUS
  Administered 2017-01-30 (×2): 5 [IU] via SUBCUTANEOUS
  Administered 2017-01-30 – 2017-01-31 (×4): 3 [IU] via SUBCUTANEOUS
  Administered 2017-01-31 (×2): 2 [IU] via SUBCUTANEOUS
  Administered 2017-02-01: 5 [IU] via SUBCUTANEOUS
  Administered 2017-02-01: 3 [IU] via SUBCUTANEOUS

## 2017-01-30 MED ORDER — INSULIN GLARGINE 100 UNIT/ML ~~LOC~~ SOLN
30.0000 [IU] | Freq: Two times a day (BID) | SUBCUTANEOUS | Status: DC
  Administered 2017-01-30 – 2017-02-01 (×4): 30 [IU] via SUBCUTANEOUS
  Filled 2017-01-30 (×4): qty 0.3

## 2017-01-30 MED ORDER — INSULIN GLARGINE 100 UNIT/ML ~~LOC~~ SOLN
30.0000 [IU] | SUBCUTANEOUS | Status: DC
  Administered 2017-01-30: 30 [IU] via SUBCUTANEOUS
  Filled 2017-01-30: qty 0.3

## 2017-01-30 MED ORDER — INSULIN GLARGINE 100 UNIT/ML ~~LOC~~ SOLN: 30.0000 [IU] | Freq: Every day | SUBCUTANEOUS | Status: DC

## 2017-01-30 MED ORDER — DEXTROSE 10 % IV SOLN: INTRAVENOUS | Status: DC | PRN

## 2017-01-30 MED ORDER — INSULIN ASPART 100 UNIT/ML ~~LOC~~ SOLN
1.0000 [IU] | SUBCUTANEOUS | Status: DC
  Administered 2017-01-30: 2 [IU] via SUBCUTANEOUS

## 2017-01-30 NOTE — Progress Notes (Signed)
   Subjective/Chief Complaint: Tol liquids with NGT in, a lot of output from stoma   Objective: Vital signs in last 24 hours: Temp:  [98 F (36.7 C)-99 F (37.2 C)] 98 F (36.7 C) (07/09 0338) Pulse Rate:  [72-96] 77 (07/09 0700) Resp:  [15-27] 15 (07/09 0700) BP: (96-149)/(44-68) 121/58 (07/09 0700) SpO2:  [87 %-99 %] 99 % (07/09 0700) FiO2 (%):  [6 %-40 %] 6 % (07/09 0000) Weight:  [98.7 kg (217 lb 9.5 oz)] 98.7 kg (217 lb 9.5 oz) (07/09 0500) Last BM Date: 01/29/17  Intake/Output from previous day: 07/08 0701 - 07/09 0700 In: 670.7 [I.V.:316.7; NG/GT:230; IV Piggyback:124] Out: 5260 [Urine:4650; Emesis/NG output:450; Stool:160] Intake/Output this shift: No intake/output data recorded.  General appearance: cooperative Resp: clear to auscultation bilaterally Cardio: regular rate and rhythm GI: soft, wound ok, +BS, large output from stoma  Lab Results:   Recent Labs  01/29/17 0312 01/30/17 0335  WBC 16.7* 13.9*  HGB 9.0* 8.9*  HCT 29.1* 28.5*  PLT 522* 538*   BMET  Recent Labs  01/29/17 1603 01/30/17 0335  NA 141 143  K 4.5 4.2  CL 98* 99*  CO2 27 28  GLUCOSE 162* 135*  BUN 121* 128*  CREATININE 2.96* 2.73*  CALCIUM 9.6 9.4   PT/INR No results for input(s): LABPROT, INR in the last 72 hours. ABG No results for input(s): PHART, HCO3 in the last 72 hours.  Invalid input(s): PCO2, PO2  Studies/Results: Dg Chest Port 1 View  Result Date: 01/29/2017 CLINICAL DATA:  Respiratory failure. EXAM: PORTABLE CHEST 1 VIEW COMPARISON:  January 28, 2017 FINDINGS: The ETT is in good position. The right PICC line is stable. No pneumothorax. Stable cardiomegaly. Support apparatus partially obscures the right base. Bibasilar effusions and possible tiny left effusion are identified. Pulmonary venous congestion remains. IMPRESSION: Support apparatus is in good position. Pulmonary venous congestion. Bibasilar opacities, left greater than right, are unchanged. Electronically  Signed   By: Dorise Bullion III M.D   On: 01/29/2017 06:45    Anti-infectives: Anti-infectives    Start     Dose/Rate Route Frequency Ordered Stop   01/27/17 1000  metroNIDAZOLE (FLAGYL) IVPB 500 mg  Status:  Discontinued     500 mg 100 mL/hr over 60 Minutes Intravenous Every 8 hours 01/27/17 0925 01/28/17 1229   01/26/17 1600  vancomycin (VANCOCIN) IVPB 1000 mg/200 mL premix  Status:  Discontinued     1,000 mg 200 mL/hr over 60 Minutes Intravenous Every 24 hours 01/26/17 1456 01/27/17 1229   01/26/17 1600  piperacillin-tazobactam (ZOSYN) IVPB 3.375 g  Status:  Discontinued     3.375 g 12.5 mL/hr over 240 Minutes Intravenous Every 8 hours 01/26/17 1456 01/28/17 1204   01/26/17 1430  metroNIDAZOLE (FLAGYL) IVPB 500 mg  Status:  Discontinued     500 mg 100 mL/hr over 60 Minutes Intravenous Every 8 hours 01/26/17 1421 01/26/17 1427      Assessment/Plan: 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 Matt Holmes, 01/18/17 [facility] - Open midline incision: Wet-to-dry dressing changes - Ostomy: WOC applied foam dressings to protect skin, stoma a little dusky - D/C NGT and give fulls  Acute renal failure- Cr 2.73 Acute on chronic diastolic CHF Respiratory failure w/ hypoxemia in post-op setting - per CCM  Anemia DM  FEN- fulls VTE- SQ heparin ID- flagyl (7/6>>), zosyn (7/5>>), vanco (7/5>>)  Plan: D/C NGT, fulls  LOS: 4 days    Crystal [name] E 01/30/2017

## 2017-01-30 NOTE — Care Management Note (Signed)
Case Management Note  Patient Details  Name: Crystal [name] MRN: [id number] Date of Birth: [birth year]  Subjective/Objective:      Admitted 6/25 to [facility] for fulminant colitis requiring partial colectomy and colostomy. Abdomen left open and left on vent. Slow to improve and wean from vent. Grossly volume overloaded. Transferred to [facility] 7/5.            Action/Plan:  PTA from home independent alone - however plans to discharge to daughters home at discharge.  Pt is open to [facility] - CM will continue to follow   Expected Discharge Date:                  Expected Discharge Plan:  [place]  In-House Referral:     Discharge planning Services  CM Consult  Post Acute Care Choice:    Choice offered to:     DME Arranged:    DME Agency:     HH Arranged:    HH Agency:     Status of Service:     If discussed at [employer], dates discussed:    Additional Comments:  Maryclare Labrador, RN 01/30/2017, 4:43 PM

## 2017-01-30 NOTE — Progress Notes (Addendum)
PULMONARY / CRITICAL CARE MEDICINE   Name: Crystal [name] MRN: [id number] DOB: [birth year]    ADMISSION DATE:  01/26/2017 (admit to [facility] 6/27) CONSULTATION DATE:  7/5  REFERRING MD:  Dr. Jens Som  CHIEF COMPLAINT:  Septic shock, colitis, VDRF  HISTORY OF PRESENT ILLNESS:   66 year old female with PMH a significant for CHF with defibrillator in place, CKD, DM, OSA on CPAP, COPD, and Hypothyroidism. 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 Abdomen left open and left on vent. Slow to improve and wean from vent. Grossly volume overloaded. Transferred to [facility] 7/5.  SUBJECTIVE:  Extubated yesterday. No issues overnight. Started on clears.   VITAL SIGNS: BP (!) 119/57   Pulse 82   Temp 98.1 F (36.7 C) (Oral)   Resp 20   Ht 5\' 5"  (1.651 m)   Wt 217 lb 9.5 oz (98.7 kg)   SpO2 (!) 89%   BMI 36.21 kg/m   HEMODYNAMICS: CVP:  [8 mmHg-9 mmHg] 9 mmHg  VENTILATOR SETTINGS: FiO2 (%):  [6 %] 6 %  INTAKE / OUTPUT: I/O last 3 completed shifts: In: 1156.8 [I.V.:417.8; NG/GT:615; IV Piggyback:124] Out: 7890 [Urine:6830; Emesis/NG output:650; Stool:410]  PHYSICAL EXAMINATION:  Gen:      No acute distress HEENT:  EOMI, sclera anicteric Neck:     No masses; no thyromegaly Lungs:    Clear to auscultation bilaterally; normal respiratory effort CV:         Regular rate and rhythm; no murmurs Abd:      Midline dressing, ostomy intact Ext:    No edema; adequate peripheral perfusion Skin:      Warm and dry; no rash Neuro: alert and oriented x 3 Psych: normal mood and affect  LABS:  BMET  Recent Labs Lab 01/29/17 0312 01/29/17 1603 01/30/17 0335  NA 138 141 143  K 5.2* 4.5 4.2  CL 96* 98* 99*  CO2 24 27 28   BUN 111* 121* 128*  CREATININE 2.97* 2.96* 2.73*  GLUCOSE 408* 162* 135*    Electrolytes  Recent Labs Lab 01/28/17 1616 01/29/17 0312 01/29/17 1603 01/30/17 0335  CALCIUM  --  9.3 9.6 9.4  MG 2.2 2.4 2.5*  --    PHOS 5.4* 5.9* 4.7*  --     CBC  Recent Labs Lab 01/28/17 0616 01/29/17 0312 01/30/17 0335  WBC 15.4* 16.7* 13.9*  HGB 8.1* 9.0* 8.9*  HCT 26.0* 29.1* 28.5*  PLT 406* 522* 538*    Coag's  Recent Labs Lab 01/26/17 1400  INR 1.17    Sepsis Markers  Recent Labs Lab 01/26/17 1400  LATICACIDVEN 1.7  PROCALCITON 2.44    ABG  Recent Labs Lab 01/26/17 1505  PHART 7.382  PCO2ART 48.4*  PO2ART 93.3    Liver Enzymes  Recent Labs Lab 01/26/17 1400 01/27/17 0505  AST 25 22  ALT 13* 13*  ALKPHOS 133* 131*  BILITOT 0.9 0.8  ALBUMIN 3.6 3.5    Cardiac Enzymes  Recent Labs Lab 01/26/17 1400  TROPONINI <0.03    Glucose  Recent Labs Lab 01/30/17 0115 01/30/17 0213 01/30/17 0256 01/30/17 0817 01/30/17 1204 01/30/17 1537  GLUCAP 142* 134* 134* 168* 297* 235*    Imaging No results found.   STUDIES:  CT abdomen 6/25 > Markedly distended colon, without small bowel dilation. Colonic diverticulosis without diverticulitis or colitis.  CT chest/abd/pelvis 7/6 > s/p subtotal colectomy and Hartman's pouch, some fluid in abdomen but not consistent with abscess, cholelithiasis noted, NG  in stomach, diffuse body wall edema  Echo 6/28 > LVEF 55 %  CULTURES: Blood 7/5 > Urine  7/5 > candida Tracheal asp 7/5 > candida   ANTIBIOTICS: Vancomycin 6/27 > 7/6 Zosyn 6/27 >  Flagyl 6/27 > 7/6  SIGNIFICANT EVENTS: 6/25 admit for RLQ pain  6/27 to OR for colitis, partial colectomy and colostomy placed  LINES/TUBES: LUE PICC pre-hosp> ETT 6/27 > Foley pre-hosp>  DISCUSSION: 66 year old female with extensive PMH who presented to [facility] for abdominal wall who was found to have severe colitis and was taken to the OR and left open.  After a week, patient made little to no progress and family requited transfer to [facility] for further care.  As of 7/8 her respiratory status has improved, she is off pressors.  Extubated 7/8.  ASSESSMENT /  PLAN: PULMONARY A: Acute respiratory failure with hypoxemia in post operative setting> weaning this morning Has cuff leak 7/8 P:   Wean down O2 as tolerated.  CARDIOVASCULAR A:  Septic shock resolved Acute on chronic diastolic CHF Hypertriglyceridemia  P:  Continue tele monitoring. Hold ARB.  Continue coreg  RENAL A:   Acute renal failure. Better today AM P:   Follow urine output and Cr Hold lasix for today  GASTROINTESTINAL A:   Colitis Nutrition  Vomiting overnight S/p partial colectomy and ostomy  P:   Wound care per surgery Clears  HEMATOLOGIC A:   Anemia P:  Monitor for bleeding Transfuse for Hb < 7gm/dL  INFECTIOUS A:   Peritonitis At risk for disseminated candidiasis Leukocytosis > Improving P:   Off antibiotics  ENDOCRINE A:   DM   Persistent hyperglycemia P:   SSI coverage. Change to resistant scale lantus coverage  NEUROLOGIC A:   No acute issues P:   PRn fentanyl  FAMILY  - Updates: Family updated bedside 7/8 by me. No family 7/9 - Inter-disciplinary family meet or Palliative Care meeting due by: 7/11   Marshell Garfinkel MD [facility] [facility] Pager [phone] If no answer or after 3pm call: [phone] 01/30/2017, 4:16 PM

## 2017-01-31 DIAGNOSIS — E1121 Type 2 diabetes mellitus with diabetic nephropathy: Secondary | ICD-10-CM | POA: Diagnosis not present

## 2017-01-31 DIAGNOSIS — J810 Acute pulmonary edema: Secondary | ICD-10-CM

## 2017-01-31 DIAGNOSIS — N183 Chronic kidney disease, stage 3 (moderate): Secondary | ICD-10-CM | POA: Diagnosis not present

## 2017-01-31 DIAGNOSIS — I1310 Hypertensive heart and chronic kidney disease without heart failure, with stage 1 through stage 4 chronic kidney disease, or unspecified chronic kidney disease: Secondary | ICD-10-CM | POA: Diagnosis not present

## 2017-01-31 DIAGNOSIS — Z933 Colostomy status: Secondary | ICD-10-CM

## 2017-01-31 DIAGNOSIS — R0602 Shortness of breath: Secondary | ICD-10-CM

## 2017-01-31 DIAGNOSIS — E1165 Type 2 diabetes mellitus with hyperglycemia: Secondary | ICD-10-CM | POA: Diagnosis not present

## 2017-01-31 DIAGNOSIS — E079 Disorder of thyroid, unspecified: Secondary | ICD-10-CM

## 2017-01-31 LAB — GLUCOSE, CAPILLARY
GLUCOSE-CAPILLARY: 108 mg/dL — AB (ref 65–99)
GLUCOSE-CAPILLARY: 185 mg/dL — AB (ref 65–99)
GLUCOSE-CAPILLARY: 160 mg/dL — AB (ref 65–99)
Glucose-Capillary: 125 mg/dL — ABNORMAL HIGH (ref 65–99)
Glucose-Capillary: 140 mg/dL — ABNORMAL HIGH (ref 65–99)
Glucose-Capillary: 159 mg/dL — ABNORMAL HIGH (ref 65–99)

## 2017-01-31 LAB — BASIC METABOLIC PANEL
Anion gap: 12 (ref 5–15)
BUN: 103 mg/dL — AB (ref 6–20)
CHLORIDE: 95 mmol/L — AB (ref 101–111)
CO2: 28 mmol/L (ref 22–32)
Calcium: 9.2 mg/dL (ref 8.9–10.3)
Creatinine, Ser: 1.96 mg/dL — ABNORMAL HIGH (ref 0.44–1.00)
GFR calc Af Amer: 29 mL/min — ABNORMAL LOW (ref >60)
GFR calc non Af Amer: 25 mL/min — ABNORMAL LOW (ref >60)
GLUCOSE: 130 mg/dL — AB (ref 65–99)
POTASSIUM: 4.2 mmol/L (ref 3.5–5.1)
Sodium: 135 mmol/L (ref 135–145)

## 2017-01-31 LAB — CULTURE, BLOOD (ROUTINE X 2)
CULTURE: NO GROWTH
Culture: NO GROWTH
Special Requests: ADEQUATE
Special Requests: ADEQUATE

## 2017-01-31 LAB — PHOSPHORUS: Phosphorus: 4.5 mg/dL (ref 2.5–4.6)

## 2017-01-31 LAB — CBC
HEMATOCRIT: 28.7 % — AB (ref 36.0–46.0)
Hemoglobin: 9.1 g/dL — ABNORMAL LOW (ref 12.0–15.0)
MCH: 27.6 pg (ref 26.0–34.0)
MCHC: 31.7 g/dL (ref 30.0–36.0)
MCV: 87 fL (ref 78.0–100.0)
Platelets: 547 10*3/uL — ABNORMAL HIGH (ref 150–400)
RBC: 3.3 MIL/uL — ABNORMAL LOW (ref 3.87–5.11)
RDW: 16.4 % — AB (ref 11.5–15.5)
WBC: 10.7 10*3/uL — AB (ref 4.0–10.5)

## 2017-01-31 LAB — MAGNESIUM: Magnesium: 2.7 mg/dL — ABNORMAL HIGH (ref 1.7–2.4)

## 2017-01-31 MED ORDER — OMEGA-3-ACID ETHYL ESTERS 1 G PO CAPS
4.0000 g | ORAL_CAPSULE | Freq: Every day | ORAL | Status: DC
  Administered 2017-02-01 – 2017-02-06 (×6): 4 g via ORAL
  Filled 2017-01-31 (×6): qty 4

## 2017-01-31 MED ORDER — LEVOTHYROXINE SODIUM 88 MCG PO TABS
88.0000 ug | ORAL_TABLET | Freq: Every day | ORAL | Status: DC
  Administered 2017-02-01 – 2017-02-06 (×6): 88 ug via ORAL
  Filled 2017-01-31 (×6): qty 1

## 2017-01-31 MED ORDER — MONTELUKAST SODIUM 10 MG PO TABS
10.0000 mg | ORAL_TABLET | Freq: Every day | ORAL | Status: DC
  Administered 2017-01-31 – 2017-02-05 (×6): 10 mg via ORAL
  Filled 2017-01-31 (×7): qty 1

## 2017-01-31 MED ORDER — ROSUVASTATIN CALCIUM 10 MG PO TABS
10.0000 mg | ORAL_TABLET | Freq: Every day | ORAL | Status: DC
  Administered 2017-01-31 – 2017-02-05 (×6): 10 mg via ORAL
  Filled 2017-01-31 (×7): qty 1

## 2017-01-31 NOTE — Progress Notes (Signed)
[facility] Progress Note     Subjective: CC: no complaints Denies abdominal pain, nausea, vomiting. Tolerated fulls. Lots of stool output from ostomy yesterday.   Objective: Vital signs in last 24 hours: Temp:  [98 F (36.7 C)-98.2 F (36.8 C)] 98.1 F (36.7 C) (07/10 0810) Pulse Rate:  [74-88] 84 (07/10 0600) Resp:  [15-25] 23 (07/10 0600) BP: (105-143)/(57-68) 133/68 (07/10 0600) SpO2:  [89 %-99 %] 91 % (07/10 0600) Weight:  [97.7 kg (215 lb 6.2 oz)] 97.7 kg (215 lb 6.2 oz) (07/10 0500) Last BM Date: 01/30/17  Intake/Output from previous day: 07/09 0701 - 07/10 0700 In: 200 [I.V.:200] Out: 4000 [Urine:2750; Stool:1250] Intake/Output this shift: No intake/output data recorded.  PE: Gen:  Alert, NAD, pleasant Card:  Regular rate and rhythm, no M/G/R appreciated Pulm:  Normal effort, clear to auscultation bilaterally Abd: Soft, non-tender, non-distended, bowel sounds present. Midline wound clean with interrupted staples. RLQ stoma dusky but putting out. Skin: warm and dry, no rashes  Psych: A&Ox3   Lab Results:   Recent Labs  01/30/17 0335 01/31/17 0337  WBC 13.9* 10.7*  HGB 8.9* 9.1*  HCT 28.5* 28.7*  PLT 538* 547*   BMET  Recent Labs  01/30/17 0335 01/31/17 0337  NA 143 135  K 4.2 4.2  CL 99* 95*  CO2 28 28  GLUCOSE 135* 130*  BUN 128* 103*  CREATININE 2.73* 1.96*  CALCIUM 9.4 9.2   PT/INR No results for input(s): LABPROT, INR in the last 72 hours. CMP     Component Value Date/Time   NA 135 01/31/2017 0337   K 4.2 01/31/2017 0337   CL 95 (L) 01/31/2017 0337   CO2 28 01/31/2017 0337   GLUCOSE 130 (H) 01/31/2017 0337   BUN 103 (H) 01/31/2017 0337   CREATININE 1.96 (H) 01/31/2017 0337   CALCIUM 9.2 01/31/2017 0337   PROT 6.9 01/27/2017 0505   ALBUMIN 3.5 01/27/2017 0505   AST 22 01/27/2017 0505   ALT 13 (L) 01/27/2017 0505   ALKPHOS 131 (H) 01/27/2017 0505   BILITOT 0.8 01/27/2017 0505   GFRNONAA 25 (L) 01/31/2017 0337   GFRAA 29 (L) 01/31/2017 0337   Lipase     Component Value Date/Time   LIPASE 37 01/26/2017 1400    Anti-infectives: Anti-infectives    Start     Dose/Rate Route Frequency Ordered Stop   01/27/17 1000  metroNIDAZOLE (FLAGYL) IVPB 500 mg  Status:  Discontinued     500 mg 100 mL/hr over 60 Minutes Intravenous Every 8 hours 01/27/17 0925 01/28/17 1229   01/26/17 1600  vancomycin (VANCOCIN) IVPB 1000 mg/200 mL premix  Status:  Discontinued     1,000 mg 200 mL/hr over 60 Minutes Intravenous Every 24 hours 01/26/17 1456 01/27/17 1229   01/26/17 1600  piperacillin-tazobactam (ZOSYN) IVPB 3.375 g  Status:  Discontinued     3.375 g 12.5 mL/hr over 240 Minutes Intravenous Every 8 hours 01/26/17 1456 01/28/17 1204   01/26/17 1430  metroNIDAZOLE (FLAGYL) IVPB 500 mg  Status:  Discontinued     500 mg 100 mL/hr over 60 Minutes Intravenous Every 8 hours 01/26/17 1421 01/26/17 1427       Assessment/Plan 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 Matt Holmes, 01/18/17 [facility] - Open midline incision: Wet-to-dry dressing changes - Ostomy: WOC applied foam dressings to protect skin, stoma a little dusky - advance to soft diet  Acute renal failure- Cr 1.96, improving Acute on chronic diastolic CHF Respiratory failure w/  hypoxemia in post-op setting- per CCM  Anemia DM  FEN- advance diet to soft VTE- SQ heparin ID- flagyl (7/6>>), zosyn (7/5>>), vanco (7/5>>)  Plan: Soft diet. Continue dressing changes. Could consider transferring out of ICU  LOS: 5 days    [name] , [facility] 01/31/2017, 8:32 AM Pager: [phone] Consults: [phone] Mon-Fri 7:00 am-4:30 pm Sat-Sun 7:00 am-11:30 am

## 2017-01-31 NOTE — Progress Notes (Addendum)
Nutrition Follow-up  DOCUMENTATION CODES:   Obesity unspecified  INTERVENTION:    Add multivitamin daily.  Add Pro-stat 30 ml once daily with lunch.  NUTRITION DIAGNOSIS:   Increased nutrient needs related to acute illness as evidenced by estimated needs.  Ongoing  GOAL:   Patient will meet greater than or equal to 90% of their needs  Progressing  MONITOR:   PO intake, Labs, Skin  ASSESSMENT:   66 yo female with PMH of CHF, defibrillator, DM, HTN, gout, asthma, hypothyroidism who was admitted to [facility] on 6/25 with abdominal pain. S/P exploratory laparotomy on 6/27 with resection of distal transverse colon, splenic flexure, descending colon, and sigmoid colon with transverse colostomy.  Patient was extubated on 7/8. Diet has been advanced to soft, patient is eating very well per discussion with RN in ICU rounds. TF and TPN have been discontinued. Patient has increased nutrient needs to support healing and recovery. Nutrition-Focused physical exam completed. Findings are no fat depletion, no muscle depletion, and mild edema.   Diet Order:  DIET SOFT Room service appropriate? Yes; Fluid consistency: Thin  Skin:  Wound (see comment) (stage I to L nare & R ear; midline abd incision)  Last BM:  7/10  Height:   Ht Readings from Last 1 Encounters:  01/26/17 5\' 5"  (1.651 m)    Weight:   Wt Readings from Last 1 Encounters:  01/31/17 215 lb 6.2 oz (97.7 kg)    Ideal Body Weight:  56.8 kg  BMI:  Body mass index is 35.84 kg/m.  Estimated Nutritional Needs:   Kcal:  2060-1561  Protein:  90-110 gm  Fluid:  1.8-2 L  EDUCATION NEEDS:   No education needs identified at this time  Molli Barrows, [place], [place], [place] Pager [phone] After Hours Pager [phone]

## 2017-01-31 NOTE — Consult Note (Addendum)
[place] Nurse ostomy consult note Surgical team following for assessment and plan of care to midline abd surgical wound.  Stoma type/location: Pt had colostomy surgery on 6/27 at another facility, according to the EMR.   Stomal assessment/size: Stoma is dark brown and dusky; 1 3/4 inches, above skin level, dark brown tightly adhered eschar to stoma from 1:00 o'clock to 4:00 o'clock Peristomal assessment: Partial thickness skin loss surrounding ostomy site related to [facility] is beginning to dry and peel; patchy areas of pink dry partial thickness skin loss remain. Treatment options for peristomal skin: Applied foam dressings to protect skin and promote healing where MARSI has occurred. One piece pouch applied with barrier ring to maintain seal. Output: Mod amt small amt brown semi-formed stool Ostomy pouching: 1pc.  Education provided: Demonstrated pouch change to patient and she asked appropriate questions, no family members present. Supplies at the bedside for staff nurse use. Will continue teaching sessions when stable and out of ICU. Enrolled patient in [employer] program: No  Julien Girt MSN, RN, CWOCN, [employer], CNS

## 2017-01-31 NOTE — Care Management Note (Signed)
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 Abdomen left open and left on vent. Slow to improve and wean from vent. Grossly volume overloaded. Transferred to [facility] 7/5.            Action/Plan:  PTA from home independent alone - however plans to discharge to daughters home at discharge.  Pt is open to [facility] - CM will continue to follow   Expected Discharge Date:                  Expected Discharge Plan:  [place]  In-House Referral:     Discharge planning Services  CM Consult  Post Acute Care Choice:    Choice offered to:     DME Arranged:    DME Agency:     HH Arranged:    HH Agency:     Status of Service:     If discussed at [employer], dates discussed:    Additional Comments: 01/31/2017 PT ordered Maryclare Labrador, RN 01/31/2017, 11:57 AM

## 2017-01-31 NOTE — Progress Notes (Signed)
PROGRESS NOTE    Crystal [name]  KGY:[id number] DOB: 1951/06/15 DOA: 01/26/2017 PCP: Teofilo Pod., PA-C  Brief Narrative:  year old female with PMH a significant for CHF with defibrillator in place, CKD, DM, OSA on CPAP, COPD, and Hypothyroidism. 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 Abdomen left open and left on vent. Slow to improve and wean from vent. Grossly volume overloaded. Transferred to [facility] 7/5 and she was extubated 01/29/17. Patient was transferred to [place] on 01/31/17. Patient is stable to transfer to the [facility] with Telemetry now that she is improving.   Assessment & Plan:   Active Problems:   Nonischemic cardiomyopathy (HCC)   Chronic renal insufficiency   Essential hypertension   Anemia, iron deficiency   Diabetes mellitus, type 2 (HCC)   Chronic systolic heart failure (HCC)   HLD (hyperlipidemia)   Disease of thyroid gland   Colitis   Pressure injury of skin   S/P colostomy (HCC)  Acute respiratory failure with Hypoxia/VDRF s/p Extubation 7/8 to Nasal Canula -Continuous Pulse Oximetry Maintain O2 Saturations >92% -Continue with Supplemental O2 and Wean as Tolerated -Patient is improved so will transfer out of ICU to SDU -Repeat CXR in AM  Septic Shock from Fulminant Colitis/Peritonitis s/p Colectomy with Transverse Colostomy (Dr. Matt Holmes 01/18/17 at [facility]) -WBC went from 13.9 -> 10.7 -Surgery Following and Appreciate further Recc's -Diet was advanced to Soft Diet today -WOC Nurse for Ostomy  -Abx now D/C'd  -Per General Surgery Wet-to-dry Dressing changes  Fulminant Colitis s/p partial colectomy / colostomy  -Improved -General Surgery Following -WBC went from 13.9 -> 10.7 -Repeat CBC in AM  Acute Decompensation on Chronic Systolic and Diastolic Grade 1 CHF with EF of 45-50% -Patient has AICD and NICM -ECHO done 04/10/16 showed The estimated ejection fraction was in the range of 45%  to 50%. Doppler parameters are consistent with abnormal left ventricular relaxation (grade 1 diastolic dysfunction). -ARB Held because of AKI on CKD -C/w Carvedilol 6.25 mg po BID -Restarted Rosuvastatin 10 mg qHS -Strict I's/O's, Daily Weights, and SLIV -Continue to Monitor Volume Status   AKI on CKD Stage 3 -BUN/Cr went from 111/2.97 -> 103/1.96 -Continue to Monitor and repeat CMP in AM  -May restart Home Lasix 20 mg po Daily in AM  And Bumex 1 mg po BID depending on Cr Fxn -Avoid Nephrotoxic Medications and Valsartan is being helt -Follow Urine output and Daily CMP's  Diabetes Mellitus Type 2 -Hold Home Insulin Glargine 100 units sq Daily, Apidra 20 units TID, and Linagliptin 5 mg po Daily -C/w Lantus 30 units sq BID and Moderate Novolog SSI q4h -CBG's ranging from 108-194  Iron Deficiency Anemia -Hb/Hct went from 8.9/28.5 -> 9.1/28.7 -Repeat CBC in Am  HLD -Restarted Home Rosuvastatin 10 mg po qHS and Omega-3 Acid 4 grams daily  COPD -Stable -Restarted Montelukast 10 mg po qHS -Will add Breathing Tx as needed   Hypothyroidisim -C/w Levothyroxine 88 mcg po Before Breakfast  Thrombocytosis -Likely Reactive to recent Colitis/Peritonitis -Platelet Count went from 538 -> 547 -Repeat CBC in AM  DVT prophylaxis: Heparin 5,000 units sq q8h Code Status: FULL CODE Family Communication: Discussed with Daughter at bedside Disposition Plan: Transfer from ICU to [facility] with Telemetry   Consultants:   PCCM Transfer  General Surgery    Procedures: S/p Extubation on 01/29/17   Antimicrobials:  Anti-infectives    Start     Dose/Rate Route Frequency Ordered Stop   01/27/17  1000  metroNIDAZOLE (FLAGYL) IVPB 500 mg  Status:  Discontinued     500 mg 100 mL/hr over 60 Minutes Intravenous Every 8 hours 01/27/17 0925 01/28/17 1229   01/26/17 1600  vancomycin (VANCOCIN) IVPB 1000 mg/200 mL premix  Status:  Discontinued     1,000 mg 200 mL/hr over 60 Minutes Intravenous Every 24  hours 01/26/17 1456 01/27/17 1229   01/26/17 1600  piperacillin-tazobactam (ZOSYN) IVPB 3.375 g  Status:  Discontinued     3.375 g 12.5 mL/hr over 240 Minutes Intravenous Every 8 hours 01/26/17 1456 01/28/17 1204   01/26/17 1430  metroNIDAZOLE (FLAGYL) IVPB 500 mg  Status:  Discontinued     500 mg 100 mL/hr over 60 Minutes Intravenous Every 8 hours 01/26/17 1421 01/26/17 1427     Subjective: Seen and examined and was feeling better but did not sleep. States she had some pain but was controlled. No nausea or vomiting and tolerating Full liquids. No other concerns or complaints at this time.   Objective: Vitals:   01/31/17 0800 01/31/17 0810 01/31/17 0900 01/31/17 1150  BP: (!) 144/67  (!) 146/62   Pulse: 86  81   Resp: (!) 25  (!) 24   Temp:  98.1 F (36.7 C)  99.3 F (37.4 C)  TempSrc:  Oral  Oral  SpO2: 91%  92%   Weight:      Height:        Intake/Output Summary (Last 24 hours) at 01/31/17 1240 Last data filed at 01/31/17 0900  Gross per 24 hour  Intake              180 ml  Output             3050 ml  Net            -2870 ml   Filed Weights   01/29/17 0450 01/30/17 0500 01/31/17 0500  Weight: 104.6 kg (230 lb 9.6 oz) 98.7 kg (217 lb 9.5 oz) 97.7 kg (215 lb 6.2 oz)   Examination: Physical Exam:  Constitutional: Pleasant WN/WD obese AAF in NAD and appears calm and comfortable Eyes: Lids and conjunctivae normal, sclerae anicteric  ENMT: External Ears, Nose appear normal. Grossly normal hearing.  Neck: Appears normal, supple, no cervical masses, normal ROM, no appreciable thyromegaly Respiratory: Diminished to auscultation bilaterally, no wheezing, rales, rhonchi or crackles. Slightly tachypenic. No accessory muscle use. Wearing Supplemental O2 via [place] Cardiovascular: RRR, no murmurs / rubs / gallops. S1 and S2 auscultated. No extremity edema. Mild 1+ LE edema Abdomen: Soft, non-tender, non-distended. No masses palpated. No appreciable hepatosplenomegaly. Bowel sounds  positive. Has Colostomy on RLQ. Abdominal Dressing appears C/D/I  GU: Deferred. Musculoskeletal: No clubbing / cyanosis of digits/nails. No joint deformity upper and lower extremities.  Skin: No rashes, lesions, ulcers on limited skin exam. No induration; Warm and dry.  Neurologic: CN 2-12 grossly intact with no focal deficits. Sensation intact in all 4 Extremities. Romberg sign cerebellar reflexes not assessed.  Psychiatric: Normal judgment and insight. Alert and oriented x 3. Normal mood and appropriate affect.   Data Reviewed: I have personally reviewed following labs and imaging studies  CBC:  Recent Labs Lab 01/26/17 1400 01/27/17 0505 01/28/17 0616 01/29/17 0312 01/30/17 0335 01/31/17 0337  WBC 15.7* 14.6* 15.4* 16.7* 13.9* 10.7*  NEUTROABS 12.3* 11.2*  --  14.5* 10.8*  --   HGB 8.5* 8.3* 8.1* 9.0* 8.9* 9.1*  HCT 26.2* 27.2* 26.0* 29.1* 28.5* 28.7*  MCV 87.0 86.9 87.8 88.2  87.4 87.0  PLT 330 316 406* 522* 538* 408*   Basic Metabolic Panel:  Recent Labs Lab 01/28/17 0616 01/28/17 1224 01/28/17 1616 01/29/17 0312 01/29/17 1603 01/30/17 0335 01/31/17 0337  NA 139  --   --  138 141 143 135  K 4.2  --   --  5.2* 4.5 4.2 4.2  CL 103  --   --  96* 98* 99* 95*  CO2 25  --   --  24 27 28 28   GLUCOSE 153*  --   --  408* 162* 135* 130*  BUN 89*  --   --  111* 121* 128* 103*  CREATININE 2.89*  --   --  2.97* 2.96* 2.73* 1.96*  CALCIUM 9.1  --   --  9.3 9.6 9.4 9.2  MG  --  2.2 2.2 2.4 2.5*  --  2.7*  PHOS  --  5.3* 5.4* 5.9* 4.7*  --  4.5   GFR: Estimated Creatinine Clearance: 32.2 mL/min (A) (by C-G formula based on SCr of 1.96 mg/dL (H)). Liver Function Tests:  Recent Labs Lab 01/26/17 1400 01/27/17 0505  AST 25 22  ALT 13* 13*  ALKPHOS 133* 131*  BILITOT 0.9 0.8  PROT 6.6 6.9  ALBUMIN 3.6 3.5    Recent Labs Lab 01/26/17 1400  LIPASE 37  AMYLASE 20*   No results for input(s): AMMONIA in the last 168 hours. Coagulation Profile:  Recent Labs Lab  01/26/17 1400  INR 1.17   Cardiac Enzymes:  Recent Labs Lab 01/26/17 1400  TROPONINI <0.03   BNP (last 3 results) No results for input(s): PROBNP in the last 8760 hours. HbA1C: No results for input(s): HGBA1C in the last 72 hours. CBG:  Recent Labs Lab 01/30/17 1928 01/30/17 2339 01/31/17 0328 01/31/17 0818 01/31/17 1151  GLUCAP 240* 194* 125* 108* 185*   Lipid Profile: No results for input(s): CHOL, HDL, LDLCALC, TRIG, CHOLHDL, LDLDIRECT in the last 72 hours. Thyroid Function Tests: No results for input(s): TSH, T4TOTAL, FREET4, T3FREE, THYROIDAB in the last 72 hours. Anemia Panel: No results for input(s): VITAMINB12, FOLATE, FERRITIN, TIBC, IRON, RETICCTPCT in the last 72 hours. Sepsis Labs:  Recent Labs Lab 01/26/17 1400  PROCALCITON 2.44  LATICACIDVEN 1.7    Recent Results (from the past 240 hour(s))  MRSA PCR Screening     Status: None   Collection Time: 01/26/17  2:17 PM  Result Value Ref Range Status   MRSA by PCR NEGATIVE NEGATIVE Final    Comment:        The GeneXpert MRSA Assay (FDA approved for NASAL specimens only), is one component of a comprehensive MRSA colonization surveillance program. It is not intended to diagnose MRSA infection nor to guide or monitor treatment for MRSA infections.   Culture, blood (routine x 2)     Status: None   Collection Time: 01/26/17  4:05 PM  Result Value Ref Range Status   Specimen Description BLOOD RIGHT ANTECUBITAL  Final   Special Requests   Final    BOTTLES DRAWN AEROBIC AND ANAEROBIC Blood Culture adequate volume   Culture NO GROWTH 5 DAYS  Final   Report Status 01/31/2017 FINAL  Final  Culture, blood (routine x 2)     Status: None   Collection Time: 01/26/17  4:15 PM  Result Value Ref Range Status   Specimen Description BLOOD RIGHT ANTECUBITAL  Final   Special Requests   Final    BOTTLES DRAWN AEROBIC AND ANAEROBIC Blood Culture adequate volume  Culture NO GROWTH 5 DAYS  Final   Report Status  01/31/2017 FINAL  Final  Urine culture     Status: Abnormal   Collection Time: 01/26/17  5:39 PM  Result Value Ref Range Status   Specimen Description URINE, CLEAN CATCH  Final   Special Requests NONE  Final   Culture >=100,000 COLONIES/mL YEAST (A)  Final   Report Status 01/27/2017 FINAL  Final  Culture, respiratory (tracheal aspirate)     Status: None   Collection Time: 01/27/17  9:29 AM  Result Value Ref Range Status   Specimen Description TRACHEAL ASPIRATE  Final   Special Requests NONE  Final   Gram Stain   Final    MODERATE WBC PRESENT, PREDOMINANTLY PMN NO SQUAMOUS EPITHELIAL CELLS SEEN RARE BUDDING YEAST SEEN    Culture FEW CANDIDA ALBICANS  Final   Report Status 01/29/2017 FINAL  Final    Radiology Studies: No results found.  Scheduled Meds: . carvedilol  6.25 mg Oral BID WC  . feeding supplement (VITAL HIGH PROTEIN)  1,000 mL Per Tube Q24H  . heparin  5,000 Units Subcutaneous Q8H  . insulin aspart  0-15 Units Subcutaneous Q4H  . insulin glargine  30 Units Subcutaneous BID  . [START ON 02/01/2017] levothyroxine  88 mcg Oral QAC breakfast  . montelukast  10 mg Oral QHS  . [START ON 02/01/2017] omega-3 acid ethyl esters  4 g Oral Daily  . rosuvastatin  10 mg Oral QHS   Continuous Infusions: . sodium chloride      LOS: 5 days   Kerney Elbe, DO [facility] Pager [phone]  If 7PM-7AM, please contact night-coverage [email or web address] Password [email or web address] 01/31/2017, 12:40 PM

## 2017-02-01 ENCOUNTER — Inpatient Hospital Stay (HOSPITAL_COMMUNITY): Payer: Medicare Other

## 2017-02-01 DIAGNOSIS — D509 Iron deficiency anemia, unspecified: Secondary | ICD-10-CM

## 2017-02-01 DIAGNOSIS — E1165 Type 2 diabetes mellitus with hyperglycemia: Secondary | ICD-10-CM

## 2017-02-01 DIAGNOSIS — I10 Essential (primary) hypertension: Secondary | ICD-10-CM

## 2017-02-01 DIAGNOSIS — Z794 Long term (current) use of insulin: Secondary | ICD-10-CM

## 2017-02-01 DIAGNOSIS — E785 Hyperlipidemia, unspecified: Secondary | ICD-10-CM

## 2017-02-01 DIAGNOSIS — I428 Other cardiomyopathies: Secondary | ICD-10-CM

## 2017-02-01 DIAGNOSIS — Z933 Colostomy status: Secondary | ICD-10-CM

## 2017-02-01 DIAGNOSIS — I5022 Chronic systolic (congestive) heart failure: Secondary | ICD-10-CM

## 2017-02-01 LAB — COMPREHENSIVE METABOLIC PANEL
ALBUMIN: 3.7 g/dL (ref 3.5–5.0)
ALK PHOS: 131 U/L — AB (ref 38–126)
ALT: 17 U/L (ref 14–54)
ANION GAP: 10 (ref 5–15)
AST: 20 U/L (ref 15–41)
BUN: 67 mg/dL — ABNORMAL HIGH (ref 6–20)
CALCIUM: 9.2 mg/dL (ref 8.9–10.3)
CHLORIDE: 99 mmol/L — AB (ref 101–111)
CO2: 26 mmol/L (ref 22–32)
Creatinine, Ser: 1.53 mg/dL — ABNORMAL HIGH (ref 0.44–1.00)
GFR calc non Af Amer: 34 mL/min — ABNORMAL LOW (ref >60)
GFR, EST AFRICAN AMERICAN: 39 mL/min — AB (ref >60)
Glucose, Bld: 189 mg/dL — ABNORMAL HIGH (ref 65–99)
POTASSIUM: 4.4 mmol/L (ref 3.5–5.1)
SODIUM: 135 mmol/L (ref 135–145)
Total Bilirubin: 0.9 mg/dL (ref 0.3–1.2)
Total Protein: 7.9 g/dL (ref 6.5–8.1)

## 2017-02-01 LAB — MAGNESIUM: Magnesium: 2.4 mg/dL (ref 1.7–2.4)

## 2017-02-01 LAB — GLUCOSE, CAPILLARY
GLUCOSE-CAPILLARY: 180 mg/dL — AB (ref 65–99)
GLUCOSE-CAPILLARY: 340 mg/dL — AB (ref 65–99)
GLUCOSE-CAPILLARY: 374 mg/dL — AB (ref 65–99)
Glucose-Capillary: 225 mg/dL — ABNORMAL HIGH (ref 65–99)
Glucose-Capillary: 306 mg/dL — ABNORMAL HIGH (ref 65–99)
Glucose-Capillary: 419 mg/dL — ABNORMAL HIGH (ref 65–99)

## 2017-02-01 LAB — CBC WITH DIFFERENTIAL/PLATELET
BASOS PCT: 0 %
Basophils Absolute: 0.1 10*3/uL (ref 0.0–0.1)
EOS ABS: 0.2 10*3/uL (ref 0.0–0.7)
EOS PCT: 2 %
HCT: 31 % — ABNORMAL LOW (ref 36.0–46.0)
Hemoglobin: 9.8 g/dL — ABNORMAL LOW (ref 12.0–15.0)
LYMPHS ABS: 2 10*3/uL (ref 0.7–4.0)
Lymphocytes Relative: 17 %
MCH: 27.3 pg (ref 26.0–34.0)
MCHC: 31.6 g/dL (ref 30.0–36.0)
MCV: 86.4 fL (ref 78.0–100.0)
MONO ABS: 1.2 10*3/uL — AB (ref 0.1–1.0)
MONOS PCT: 10 %
Neutro Abs: 8.8 10*3/uL — ABNORMAL HIGH (ref 1.7–7.7)
Neutrophils Relative %: 71 %
PLATELETS: 571 10*3/uL — AB (ref 150–400)
RBC: 3.59 MIL/uL — ABNORMAL LOW (ref 3.87–5.11)
RDW: 16 % — AB (ref 11.5–15.5)
WBC: 12.3 10*3/uL — ABNORMAL HIGH (ref 4.0–10.5)

## 2017-02-01 LAB — PHOSPHORUS: PHOSPHORUS: 3.4 mg/dL (ref 2.5–4.6)

## 2017-02-01 MED ORDER — ACETAMINOPHEN 160 MG/5ML PO SOLN
650.0000 mg | ORAL | Status: DC | PRN
Start: 2017-02-01 — End: 2017-02-04
  Administered 2017-02-02: 650 mg via ORAL
  Filled 2017-02-01: qty 20.3

## 2017-02-01 MED ORDER — GABAPENTIN 100 MG PO CAPS: 100.0000 mg | ORAL_CAPSULE | ORAL | Status: DC

## 2017-02-01 MED ORDER — INSULIN GLARGINE 100 UNIT/ML ~~LOC~~ SOLN
35.0000 [IU] | Freq: Two times a day (BID) | SUBCUTANEOUS | Status: DC
  Filled 2017-02-01: qty 0.35

## 2017-02-01 MED ORDER — INSULIN GLARGINE 100 UNIT/ML ~~LOC~~ SOLN
40.0000 [IU] | Freq: Two times a day (BID) | SUBCUTANEOUS | Status: DC
  Administered 2017-02-02: 40 [IU] via SUBCUTANEOUS
  Filled 2017-02-01 (×3): qty 0.4

## 2017-02-01 MED ORDER — INSULIN ASPART 100 UNIT/ML ~~LOC~~ SOLN
0.0000 [IU] | Freq: Every day | SUBCUTANEOUS | Status: DC
  Administered 2017-02-02: 2 [IU] via SUBCUTANEOUS
  Administered 2017-02-04: 3 [IU] via SUBCUTANEOUS

## 2017-02-01 MED ORDER — INSULIN ASPART 100 UNIT/ML ~~LOC~~ SOLN
8.0000 [IU] | Freq: Once | SUBCUTANEOUS | Status: AC
  Administered 2017-02-01: 8 [IU] via SUBCUTANEOUS

## 2017-02-01 MED ORDER — FEBUXOSTAT 40 MG PO TABS
40.0000 mg | ORAL_TABLET | Freq: Every day | ORAL | Status: DC
  Administered 2017-02-01 – 2017-02-06 (×6): 40 mg via ORAL
  Filled 2017-02-01 (×6): qty 1

## 2017-02-01 MED ORDER — GABAPENTIN 400 MG PO CAPS
400.0000 mg | ORAL_CAPSULE | Freq: Every day | ORAL | Status: DC
  Administered 2017-02-01 – 2017-02-05 (×5): 400 mg via ORAL
  Filled 2017-02-01 (×6): qty 1

## 2017-02-01 MED ORDER — LORATADINE 10 MG PO TABS
10.0000 mg | ORAL_TABLET | Freq: Every day | ORAL | Status: DC
  Administered 2017-02-02 – 2017-02-06 (×5): 10 mg via ORAL
  Filled 2017-02-01 (×5): qty 1

## 2017-02-01 MED ORDER — INSULIN ASPART 100 UNIT/ML ~~LOC~~ SOLN
0.0000 [IU] | Freq: Three times a day (TID) | SUBCUTANEOUS | Status: DC
  Administered 2017-02-01: 11 [IU] via SUBCUTANEOUS
  Administered 2017-02-01: 15 [IU] via SUBCUTANEOUS
  Administered 2017-02-02: 5 [IU] via SUBCUTANEOUS
  Administered 2017-02-02: 8 [IU] via SUBCUTANEOUS
  Administered 2017-02-02: 11 [IU] via SUBCUTANEOUS
  Administered 2017-02-03 (×2): 3 [IU] via SUBCUTANEOUS
  Administered 2017-02-03 – 2017-02-04 (×2): 5 [IU] via SUBCUTANEOUS
  Administered 2017-02-04: 2 [IU] via SUBCUTANEOUS
  Administered 2017-02-04: 8 [IU] via SUBCUTANEOUS
  Administered 2017-02-05 (×2): 3 [IU] via SUBCUTANEOUS
  Administered 2017-02-06: 8 [IU] via SUBCUTANEOUS

## 2017-02-01 MED ORDER — PROMETHAZINE HCL 25 MG PO TABS
12.5000 mg | ORAL_TABLET | Freq: Three times a day (TID) | ORAL | Status: DC | PRN
  Administered 2017-02-01 – 2017-02-02 (×3): 12.5 mg via ORAL
  Filled 2017-02-01 (×3): qty 1

## 2017-02-01 MED ORDER — GABAPENTIN 100 MG PO CAPS
100.0000 mg | ORAL_CAPSULE | Freq: Every morning | ORAL | Status: DC
  Administered 2017-02-02 – 2017-02-06 (×5): 100 mg via ORAL
  Filled 2017-02-01 (×5): qty 1

## 2017-02-01 NOTE — Progress Notes (Signed)
During assessment, RN noticed patient's stool a melanous color and liquid. WOC had seen patient earlier in the day and documented stool at semi formed and brown. [facility] paged and made aware. No new orders at this time.

## 2017-02-01 NOTE — Evaluation (Signed)
Occupational Therapy Evaluation Patient Details Name: Crystal [name] MRN: [id number] DOB: 1951-01-31 Today's Date: 02/01/2017    History of Present Illness 66 year old female with PMH as below, which is significant for CHF with defibrillator in place, CKD, DM, OSA on CPAP, COPD, and Hypothyroidism. She was admitted to [facility] with severe RLQ pain on 6/25. Upon admission CT showed distended colon. She was presumed to have colitis and was admitted to the hospitalists. The following day she became more lethargic and hypotensive. She was moved to CCU and general surgery became involved. 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 Colostomy placed and abdomen left open. Post-operatively she was shocky despite 4-5L of IVF. She was started on pressors. Renal function worsened and she was diuresed with lasix drip putting out about 12-30L. At that point she was felt to be improving. Pressors able to come off. She was tolerating WUA. Family requested transfer to [facility] 7/5 when it was felt that the patient was not progressing as expected.    Clinical Impression   Pt was independent in all ADL, IADL and driving prior to admission. Presents with impaired cognition, generalized weakness, poor activity tolerance and decreased sitting and standing balance. Pt requires min to total assist for ADL and 2 person moderate assistance for mobility. She will need intensive rehab prior to return home. Will follow acutely.    Follow Up Recommendations  CIR;Supervision/Assistance - 24 hour    Equipment Recommendations  3 in 1 bedside commode    Recommendations for Other Services Rehab consult     Precautions / Restrictions Precautions Precautions: Fall Restrictions Weight Bearing Restrictions: No      Mobility Bed Mobility Overal bed mobility: Needs Assistance Bed Mobility: Supine to Sit      Supine to sit: Mod assist;HOB elevated     General bed mobility comments: moderate assist to elevate trunk to upright and rotate hips to EOB  Transfers Overall transfer level: Needs assistance Equipment used: Rolling walker (2 wheeled) Transfers: Sit to/from [facility] Sit to Stand: Mod assist;+2 physical assistance;+2 safety/equipment Stand pivot transfers: Mod assist;+2 physical assistance;+2 safety/equipment       General transfer comment: multimodal cues for hand placement and power up to standing. Decreased ability to maintain upright, cues for facilitation of upright posture.     Balance Overall balance assessment: Needs assistance   Sitting balance-Leahy Scale: Poor Sitting balance - Comments: right list and posterior lean noted. Min assist to maintain balance at EOB at times Postural control: Posterior lean;Right lateral lean Standing balance support: Bilateral upper extremity supported Standing balance-Leahy Scale: Poor Standing balance comment: heavy reliance on BUE support to maintain upright positioning                           ADL either performed or assessed with clinical judgement   ADL Overall ADL's : Needs assistance/impaired Eating/Feeding: Sitting;Set up   Grooming: Minimal assistance;Sitting   Upper Body Bathing: Sitting;Moderate assistance   Lower Body Bathing: Total assistance;+2 for physical assistance;Sit to/from stand   Upper Body Dressing : Sitting;Moderate assistance   Lower Body Dressing: Total assistance;+2 for physical assistance;Sit to/from stand   Toilet Transfer: +2 for physical assistance;Moderate assistance;Stand-pivot;RW Toilet Transfer Details (indicate cue type and reason): simulated to chair Toileting- Clothing Manipulation and Hygiene: Total assistance;+2 for physical assistance;Sit to/from stand  Functional mobility during ADLs:  (not able to ambulate)       Vision Patient Visual Report:  No change from baseline       Perception     Praxis      Pertinent Vitals/Pain Pain Assessment: No/denies pain     Hand Dominance Right   Extremity/Trunk Assessment Upper Extremity Assessment Upper Extremity Assessment: Generalized weakness   Lower Extremity Assessment Lower Extremity Assessment: Generalized weakness       Communication Communication Communication: No difficulties   Cognition Arousal/Alertness: Awake/alert Behavior During Therapy: Flat affect Overall Cognitive Status: Impaired/Different from baseline Area of Impairment: Safety/judgement;Awareness;Problem solving                         Safety/Judgement: Decreased awareness of safety;Decreased awareness of deficits Awareness: Intellectual Problem Solving: Slow processing;Requires verbal cues;Requires tactile cues General Comments: patient with poor insight throughout session, slow to respond at times despite cues.   General Comments       Exercises     Shoulder Instructions      Home Living Family/patient expects to be discharged to:: Private residence Living Arrangements: Children Available Help at Discharge: Family Type of Home: House Home Access: Level entry     Home Layout: Bed/bath upstairs;Full bath on main level;Two level Alternate Level Stairs-Number of Steps: 15 Alternate Level Stairs-Rails: Can reach both Bathroom Shower/Tub: Occupational psychologist: Standard     Home Equipment: None          Prior Functioning/Environment Level of Independence: Independent                 OT Problem List: Decreased strength;Decreased activity tolerance;Impaired balance (sitting and/or standing);Decreased cognition;Decreased safety awareness;Decreased knowledge of use of DME or AE;Obesity      OT Treatment/Interventions: Self-care/ADL training;DME and/or AE instruction;Patient/family education;Balance training;Therapeutic activities    OT Goals(Current goals can  be found in the care plan section) Acute Rehab OT Goals Patient Stated Goal: to go home OT Goal Formulation: With patient Time For Goal Achievement: 02/15/17 Potential to Achieve Goals: Good ADL Goals Pt Will Perform Grooming: standing;with min assist Pt Will Perform Upper Body Dressing: with supervision;sitting Pt Will Perform Lower Body Dressing: with mod assist;sit to/from stand Pt Will Transfer to Toilet: with min assist;ambulating;bedside commode Pt Will Perform Toileting - Clothing Manipulation and hygiene: with min assist;sit to/from stand Additional ADL Goal #1: Pt will perform bed mobility with min assist in preparation for ADL.  OT Frequency: Min 2X/week   Barriers to D/C:            Co-evaluation   Reason for Co-Treatment: Complexity of the patient's impairments (multi-system involvement)   OT goals addressed during session: ADL's and self-care      AM-PAC PT "6 Clicks" Daily Activity     Outcome Measure Help from another person eating meals?: A Little Help from another person taking care of personal grooming?: A Little Help from another person toileting, which includes using toliet, bedpan, or urinal?: Total Help from another person bathing (including washing, rinsing, drying)?: A Lot Help from another person to put on and taking off regular upper body clothing?: A Lot Help from another person to put on and taking off regular lower body clothing?: Total 6 Click Score: 12   End of Session Equipment Utilized During Treatment: Gait belt;Rolling walker Nurse Communication: Mobility status (ok to give diet ginger ale)  Activity Tolerance: Patient limited by fatigue Patient left: in chair;with  call bell/phone within reach;with family/visitor present  OT Visit Diagnosis: Unsteadiness on feet (R26.81);Muscle weakness (generalized) (M62.81);Other symptoms and signs involving cognitive function                Time: 5400-8676 OT Time Calculation (min): 21 min Charges:   OT General Charges $OT Visit: 1 Procedure OT Evaluation $OT Eval Moderate Complexity: 1 Procedure G-Codes:      Malka So 02/01/2017, 4:22 PM  [phone]

## 2017-02-01 NOTE — Progress Notes (Signed)
   Subjective/Chief Complaint: CC tolerated soft diet last night   Objective: Vital signs in last 24 hours: Temp:  [98.1 F (36.7 C)-99.3 F (37.4 C)] 99.3 F (37.4 C) (07/11 0426) Pulse Rate:  [81-98] 98 (07/11 0700) Resp:  [21-32] 24 (07/11 0700) BP: (128-159)/(56-77) 148/68 (07/11 0700) SpO2:  [91 %-97 %] 92 % (07/11 0700) Weight:  [97.6 kg (215 lb 2.7 oz)] 97.6 kg (215 lb 2.7 oz) (07/11 0500) Last BM Date: 01/31/17  Intake/Output from previous day: 07/10 0701 - 07/11 0700 In: 240 [I.V.:240] Out: 2710 [Urine:2550; Stool:160] Intake/Output this shift: No intake/output data recorded.  General appearance: cooperative Resp: clear to auscultation bilaterally Cardio: regular rate and rhythm GI: soft, ostomy viable with output, wound clean  Lab Results:   Recent Labs  01/31/17 0337 02/01/17 0426  WBC 10.7* 12.3*  HGB 9.1* 9.8*  HCT 28.7* 31.0*  PLT 547* 571*   BMET  Recent Labs  01/31/17 0337 02/01/17 0426  NA 135 135  K 4.2 4.4  CL 95* 99*  CO2 28 26  GLUCOSE 130* 189*  BUN 103* 67*  CREATININE 1.96* 1.53*  CALCIUM 9.2 9.2   PT/INR No results for input(s): LABPROT, INR in the last 72 hours. ABG No results for input(s): PHART, HCO3 in the last 72 hours.  Invalid input(s): PCO2, PO2  Studies/Results: Dg Chest Port 1 View  Result Date: 02/01/2017 CLINICAL DATA:  Shortness of breath. EXAM: PORTABLE CHEST 1 VIEW COMPARISON:  Radiographs of January 29, 2017. FINDINGS: Stable cardiomediastinal silhouette. Left-sided pacemaker is unchanged in position. No pneumothorax or pleural effusion is noted. No consolidative process is noted. Bony thorax is unremarkable. IMPRESSION: No acute cardiopulmonary abnormality seen. Electronically Signed   By: Marijo Conception, M.D.   On: 02/01/2017 07:12    Anti-infectives: Anti-infectives    Start     Dose/Rate Route Frequency Ordered Stop   01/27/17 1000  metroNIDAZOLE (FLAGYL) IVPB 500 mg  Status:  Discontinued     500  mg 100 mL/hr over 60 Minutes Intravenous Every 8 hours 01/27/17 0925 01/28/17 1229   01/26/17 1600  vancomycin (VANCOCIN) IVPB 1000 mg/200 mL premix  Status:  Discontinued     1,000 mg 200 mL/hr over 60 Minutes Intravenous Every 24 hours 01/26/17 1456 01/27/17 1229   01/26/17 1600  piperacillin-tazobactam (ZOSYN) IVPB 3.375 g  Status:  Discontinued     3.375 g 12.5 mL/hr over 240 Minutes Intravenous Every 8 hours 01/26/17 1456 01/28/17 1204   01/26/17 1430  metroNIDAZOLE (FLAGYL) IVPB 500 mg  Status:  Discontinued     500 mg 100 mL/hr over 60 Minutes Intravenous Every 8 hours 01/26/17 1421 01/26/17 1427      Assessment/Plan: 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 Matt Holmes, 01/18/17 [facility] - Open midline incision: Wet-to-dry dressing changes - Ostomy: WOC applied foam dressings to protect skin, stoma OK - soft diet  Acute renal failure- Cr 1.53, improving Acute on chronic diastolic CHF Respiratory failure w/ hypoxemia in post-op setting- improved Anemia DM  LOS: 6 days    Crystal [name] E 02/01/2017

## 2017-02-01 NOTE — Progress Notes (Addendum)
PROGRESS NOTE   Crystal [name]  PZW:[id number]    DOB: 1950-09-05    DOA: 01/26/2017  PCP: Teofilo Pod., PA-C   I have briefly reviewed patients previous medical records in [facility].  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 on 7/5 when it was felt that patient was not progressing as expected, admitted to ICU by CCM. Extubated 01/29/17 and transferred to [facility] service on 01/31/17. CC is following.   Assessment & Plan:   Active Problems:   Nonischemic cardiomyopathy (HCC)   Chronic renal insufficiency   Essential hypertension   Anemia, iron deficiency   Diabetes mellitus, type 2 (HCC)   Chronic systolic heart failure (HCC)   HLD (hyperlipidemia)   Disease of thyroid gland   Colitis   Pressure injury of skin   S/P colostomy ([place])   1. Fulminant colitis, s/p colectomy with transverse colostomy, Dr. Matt Holmes 02/17/17 at [facility]: Gen. surgery follow-up appreciated. Open midline incision for which she is undergoing wet-to-dry dressing changes. [place] team assisting with ostomy management. Tolerating soft diet. 2. Acute respiratory failure with hypoxia/VDRF: Extubated 01/29/17. Hypoxia resolved. Incentive spirometry. Chest x-ray 7/11 personally reviewed in no acute findings seen. 3. Septic shock: Due to fulminant colitis/peritonitis. Status post surgery  as above. Resolved. Antibiotics completed. Blood cultures negative. 4. Acute on Chronic systolic CHF/NICM/chronic LBBB/BiV AICD: As per outpatient Cardiology follow-up notes Dr. Harl Bowie, previously severe LV systolic dysfunction that normalized with medical treatment and AICD. BiV no longer active as reached ERI, was not replaced due to normalized LV function. Echo 04/10/16: LVEF 45-50 percent and grade 1 diastolic dysfunction. ARB held due to acute on chronic kidney disease. Continue carvedilol and rosuvastatin. Reviewed EKG 7/11 with a cardiologist and corrected QTC is 420 ms. 5. Acute on stage III chronic kidney disease: Patient presented with creatinine of 2.17 which has improved gradually to 1.53. Baseline creatinine not known. Since creatinine continues to improve, continue to hold ARB and since not overtly volume overloaded, we will also continue to monitor off of diuretics. 6. Type II DM: Uncontrolled on reduced than home dose of Lantus. Adjust Lantus and SSI. 7. Iron deficiency anemia: Stable hemoglobin in the 9 g range. 8. COPD: Stable without clinical bronchospasm. 9. Hyperlipidemia: Continue rosuvastatin 10. Essential hypertension: Mildly uncontrolled at times. Continue carvedilol and monitor. 11. Reactive thrombocytosis: Periodically follow CBCs. 12. OSA: Nightly CPAP.   DVT prophylaxis: Subcutaneous heparin Code Status: Full Family Communication: None at bedside Disposition: Awaiting transfer from ICU to telemetry when bed available. No beds currently available.   Consultants:  CCM-signed off General surgery   Procedures:  Extubated 01/29/2017 Left upper extremity PICC line.  Antimicrobials:  Completed    Subjective: Mild intermittent nausea without vomiting. Tolerating some soft diet. Passing flatus. No BM. Appropriate mild abdominal discomfort. As per RN, no acute issues.   ROS: Denies dyspnea, chest pain, dizziness or lightheadedness.  Objective:  Vitals:   02/01/17  1000 02/01/17 1100 02/01/17 1200  02/01/17 1213  BP: (!) 167/141  (!) 142/74   Pulse: 95 95    Resp: (!) 22 (!) 31 (!) 31   Temp:    98.6 F (37 C)  TempSrc:    Oral  SpO2: 94% 94% 94%   Weight:      Height:        Examination:  General exam: Pleasant middle-aged female, moderately built and obese lying comfortably propped up in bed. Respiratory system: Slightly diminished breath sounds in the bases without wheezing, rhonchi or crackles. Rest of lung fields clear to auscultation. No increased work of breathing. Cardiovascular system: S1 & S2 heard, RRR. No JVD, murmurs, rubs, gallops or clicks. No pedal edema. Telemetry: Sinus rhythm with BBB morphology. Gastrointestinal system: Surgical dressing clean and dry. Colostomy draining minimal amount of blood stained liquid. Not distended. Soft. Mild appropriate postop tenderness. No organomegaly or masses felt. Normal bowel sounds heard. Central nervous system: Alert and oriented. No focal neurological deficits. Extremities: Symmetric 5 x 5 power. Skin: No rashes, lesions or ulcers Psychiatry: Judgement and insight appear normal. Mood & affect appropriate.     Data Reviewed: I have personally reviewed following labs and imaging studies  CBC:  Recent Labs Lab 01/26/17 1400 01/27/17 0505 01/28/17 0616 01/29/17 0312 01/30/17 0335 01/31/17 0337 02/01/17 0426  WBC 15.7* 14.6* 15.4* 16.7* 13.9* 10.7* 12.3*  NEUTROABS 12.3* 11.2*  --  14.5* 10.8*  --  8.8*  HGB 8.5* 8.3* 8.1* 9.0* 8.9* 9.1* 9.8*  HCT 26.2* 27.2* 26.0* 29.1* 28.5* 28.7* 31.0*  MCV 87.0 86.9 87.8 88.2 87.4 87.0 86.4  PLT 330 316 406* 522* 538* 547* 481*   Basic Metabolic Panel:  Recent Labs Lab 01/28/17 1616 01/29/17 0312 01/29/17 1603 01/30/17 0335 01/31/17 0337 02/01/17 0426  NA  --  138 141 143 135 135  K  --  5.2* 4.5 4.2 4.2 4.4  CL  --  96* 98* 99* 95* 99*  CO2  --  24 27 28 28 26   GLUCOSE  --  408* 162* 135* 130* 189*  BUN  --  111* 121* 128* 103*  67*  CREATININE  --  2.97* 2.96* 2.73* 1.96* 1.53*  CALCIUM  --  9.3 9.6 9.4 9.2 9.2  MG 2.2 2.4 2.5*  --  2.7* 2.4  PHOS 5.4* 5.9* 4.7*  --  4.5 3.4   Liver Function Tests:  Recent Labs Lab 01/26/17 1400 01/27/17 0505 02/01/17 0426  AST 25 22 20   ALT 13* 13* 17  ALKPHOS 133* 131* 131*  BILITOT 0.9 0.8 0.9  PROT 6.6 6.9 7.9  ALBUMIN 3.6 3.5 3.7   Coagulation Profile:  Recent Labs Lab 01/26/17 1400  INR 1.17   Cardiac Enzymes:  Recent Labs Lab 01/26/17 1400  TROPONINI <0.03   HbA1C: No results for input(s): HGBA1C in the last 72 hours. CBG:  Recent Labs Lab 01/31/17 1932 01/31/17 2341 02/01/17 0425 02/01/17 0809 02/01/17 1211  GLUCAP 140* 159* 180* 225* 306*    Recent Results (from the past 240 hour(s))  MRSA PCR Screening     Status: None   Collection Time: 01/26/17  2:17 PM  Result Value Ref Range Status   MRSA by PCR NEGATIVE NEGATIVE Final    Comment:        The GeneXpert MRSA Assay (FDA approved for NASAL specimens only), is one component of a comprehensive MRSA colonization surveillance program. It is not intended to diagnose MRSA infection nor to guide or monitor treatment for MRSA  infections.   Culture, blood (routine x 2)     Status: None   Collection Time: 01/26/17  4:05 PM  Result Value Ref Range Status   Specimen Description BLOOD RIGHT ANTECUBITAL  Final   Special Requests   Final    BOTTLES DRAWN AEROBIC AND ANAEROBIC Blood Culture adequate volume   Culture NO GROWTH 5 DAYS  Final   Report Status 01/31/2017 FINAL  Final  Culture, blood (routine x 2)     Status: None   Collection Time: 01/26/17  4:15 PM  Result Value Ref Range Status   Specimen Description BLOOD RIGHT ANTECUBITAL  Final   Special Requests   Final    BOTTLES DRAWN AEROBIC AND ANAEROBIC Blood Culture adequate volume   Culture NO GROWTH 5 DAYS  Final   Report Status 01/31/2017 FINAL  Final  Urine culture     Status: Abnormal   Collection Time: 01/26/17  5:39 PM   Result Value Ref Range Status   Specimen Description URINE, CLEAN CATCH  Final   Special Requests NONE  Final   Culture >=100,000 COLONIES/mL YEAST (A)  Final   Report Status 01/27/2017 FINAL  Final  Culture, respiratory (tracheal aspirate)     Status: None   Collection Time: 01/27/17  9:29 AM  Result Value Ref Range Status   Specimen Description TRACHEAL ASPIRATE  Final   Special Requests NONE  Final   Gram Stain   Final    MODERATE WBC PRESENT, PREDOMINANTLY PMN NO SQUAMOUS EPITHELIAL CELLS SEEN RARE BUDDING YEAST SEEN    Culture FEW CANDIDA ALBICANS  Final   Report Status 01/29/2017 FINAL  Final         Radiology Studies: Dg Chest Port 1 View  Result Date: 02/01/2017 CLINICAL DATA:  Shortness of breath. EXAM: PORTABLE CHEST 1 VIEW COMPARISON:  Radiographs of January 29, 2017. FINDINGS: Stable cardiomediastinal silhouette. Left-sided pacemaker is unchanged in position. No pneumothorax or pleural effusion is noted. No consolidative process is noted. Bony thorax is unremarkable. IMPRESSION: No acute cardiopulmonary abnormality seen. Electronically Signed   By: Marijo Conception, M.D.   On: 02/01/2017 07:12        Scheduled Meds: . carvedilol  6.25 mg Oral BID WC  . heparin  5,000 Units Subcutaneous Q8H  . insulin aspart  0-15 Units Subcutaneous TID WC  . insulin aspart  0-5 Units Subcutaneous QHS  . insulin glargine  30 Units Subcutaneous BID  . levothyroxine  88 mcg Oral QAC breakfast  . montelukast  10 mg Oral QHS  . omega-3 acid ethyl esters  4 g Oral Daily  . rosuvastatin  10 mg Oral QHS   Continuous Infusions: . sodium chloride       LOS: 6 days     Naydeline Morace, MD, FACP, FHM. [facility] Pager [phone]  If 7PM-7AM, please contact night-coverage [email or web address] Password [email or web address] 02/01/2017, 1:01 PM

## 2017-02-01 NOTE — Care Management Note (Signed)
Case Management Note  Patient Details  Name: Crystal [name] MRN: [id number] Date of Birth: 1951-02-16  Subjective/Objective:      Admitted 6/25 to [facility] for fulminant colitis requiring partial colectomy and colostomy. Abdomen left open and left on vent. Slow to improve and wean from vent. Grossly volume overloaded. Transferred to [facility] 7/5.            Action/Plan:  PTA from home independent alone - however plans to discharge to daughters home at discharge.  Pt is open to [facility] - CM will continue to follow   Expected Discharge Date:                  Expected Discharge Plan:  [place]  In-House Referral:     Discharge planning Services  CM Consult  Post Acute Care Choice:    Choice offered to:     DME Arranged:    DME Agency:     HH Arranged:    HH Agency:     Status of Service:     If discussed at [employer], dates discussed:    Additional Comments: 02/01/2017  CIR recommended -CSW consulted for back up plan   01/31/17 PT ordered Maryclare Labrador, RN 02/01/2017, 2:25 PM

## 2017-02-01 NOTE — Progress Notes (Signed)
Paged MD and made him aware of CBG's have been trending up today. CBGs have been 225, 306, 340.

## 2017-02-01 NOTE — Progress Notes (Signed)
Crystal [name] is a 66 y.o. female patient who transferred  from 25M awake, alert  & orientated  X 4, Full Code, VSS - Blood pressure (!) 149/55, pulse (!) 108, temperature 98.8 F (37.1 C), resp. rate (!) 28, height 5\' 5"  (1.651 m), weight 97.6 kg (215 lb 2.7 oz), SpO2 96 %., R/A, no c/o shortness of breath, no c/o chest pain, no distress noted. Tele # 20 placed and pt is currently running:sinus tachycardia.   IV site WDL: wrist right and left upper arm PICC, condition patent and no redness with a transparent dsg that's clean dry and intact.  Allergies:   Allergies  Allergen Reactions  . Glucophage [Metformin Hcl] Other (See Comments)    Renal failure  . Ace Inhibitors Swelling and Other (See Comments)    Angioedema  . Advicor [Niacin-Lovastatin Er] Other (See Comments)    Muscle aches  . Bystolic [Nebivolol Hcl] Other (See Comments)    Edema  . Erythromycin Diarrhea, Nausea And Vomiting and Other (See Comments)    *DERIVATIVES*  . Lipitor [Atorvastatin] Other (See Comments)    Muscle aches  . Lopid [Gemfibrozil] Other (See Comments)    Muscle aches  . Statins Other (See Comments)    Muscle aches  . Adhesive [Tape] Rash     Past Medical History:  Diagnosis Date  . Anemia, iron deficiency   . Angioedema    felt to likely be due to ace inhibitors but says she has had this even off of medicines, appears to be tolerating ARBs chronically  . Automatic implantable cardiac defibrillator in situ    high RV threshold chronically, device was turned off in 2014  . Back pain   . CHF (congestive heart failure) ([place])   . Chronic renal insufficiency   . Diabetes mellitus, type 2 ([place])   . Gout   . Hyperlipidemia   . Hypertension   . Intervertebral disc degeneration   . LBBB (left bundle branch block)   . Nonischemic cardiomyopathy ([place])    s/p ICD in 2005. EF has since recovered  . Obesity   . Systemic hypertension   . Unspecified hypothyroidism     Pt orientation to unit, room  and routine. SR up x 2, fall risk assessment complete with Patient and family verbalizing understanding of risks associated with falls. Pt verbalizes an understanding of how to use the call bell and to call for help before getting out of bed.  Skin, clean-dry- intact with pressure ulcer to left nares from when pt. Had NGT, right ear for O2 tubing, colostomy to right lower quadrant.  Midline surgical incision dsg CDI.    Will cont to monitor and assist as needed.  Lindalou Hose, RN 02/01/2017 7:23 PM

## 2017-02-01 NOTE — Progress Notes (Signed)
Pt transferred to 5W20 per MD order via bed. Family members at bedside and aware of transfer.

## 2017-02-01 NOTE — Progress Notes (Signed)
Rehab Admissions Coordinator Note:  Patient was screened by Cleatrice Burke for appropriateness for an Inpatient Acute Rehab Consult. Per therapy recommendations.  At this time, we are recommending Inpatient Rehab consult.  Cleatrice Burke 02/01/2017, 5:01 PM  I can be reached at [phone].

## 2017-02-01 NOTE — Progress Notes (Signed)
Report received from Allegra Grana, RN, will await pt. Arrival to room 5W20.  Alphonzo Lemmings, RN

## 2017-02-01 NOTE — Evaluation (Signed)
Physical Therapy Evaluation Patient Details Name: Crystal [name] MRN: [id number] DOB: 1951-03-18 Today's Date: 02/01/2017   History of Present Illness  66 year old female with PMH as below, which is significant for CHF with defibrillator in place, CKD, DM, OSA on CPAP, COPD, and Hypothyroidism. She was admitted to [facility] with severe RLQ pain on 6/25. She was taken emergently to OR and is s/p colostomy. There were post operative complications at which time family requested transfer to [facility] 7/5.  Clinical Impression  Orders received for PT evaluation. Patient demonstrates deficits in functional mobility as indicated below. Will benefit from continued skilled PT to address deficits and maximize function. Will see as indicated and progress as tolerated.  Given patients prior level of full independence, great family support (son at bedside, daughter on face-time throughout session, and patients desire to return to [facility] feel patient would benefit from comprehensive inpatient rehabilitation to address deficits in function and cognition to ensure safe d/c home.     Follow Up Recommendations CIR    Equipment Recommendations  Rolling walker with 5" wheels    Recommendations for Other Services Rehab consult     Precautions / Restrictions Precautions Precautions: Fall Restrictions Weight Bearing Restrictions: No      Mobility  Bed Mobility Overal bed mobility: Needs Assistance Bed Mobility: Supine to Sit     Supine to sit: Mod assist;HOB elevated     General bed mobility comments: moderate assist to elevate trunk to upright and rotate hips to EOB  Transfers Overall transfer level: Needs assistance Equipment used: Rolling walker (2 wheeled) Transfers: Sit to/from [facility] Sit to Stand: Mod assist;+2 physical assistance;+2 safety/equipment Stand pivot transfers: Mod assist;+2 physical assistance;+2 safety/equipment       General transfer  comment: multimodal cues for hand placement and power up to standing. Decreased ability to maintain upright, cues for facilitation of upright posture.   Ambulation/Gait                Stairs            Wheelchair Mobility    Modified Rankin (Stroke Patients Only)       Balance Overall balance assessment: Needs assistance Sitting-balance support: Feet supported Sitting balance-Leahy Scale: Poor Sitting balance - Comments: right list and posterior lean noted. Min assist to maintain balance at EOB at times Postural control: Posterior lean;Right lateral lean Standing balance support: Bilateral upper extremity supported Standing balance-Leahy Scale: Poor Standing balance comment: heavy reliance on BUE support to maintain upright positioning                             Pertinent Vitals/Pain Pain Assessment: No/denies pain    Home Living Family/patient expects to be discharged to:: Private residence Living Arrangements: Children Available Help at Discharge: Family Type of Home: House Home Access: Level entry     Home Layout: Bed/bath upstairs;Full bath on main level;Two level        Prior Function Level of Independence: Independent               Hand Dominance   Dominant Hand: Right    Extremity/Trunk Assessment   Upper Extremity Assessment Upper Extremity Assessment: Generalized weakness    Lower Extremity Assessment Lower Extremity Assessment: Generalized weakness    Cervical / Trunk Assessment Cervical / Trunk Assessment:  (increased body habitus)  Communication   Communication: No difficulties  Cognition Arousal/Alertness: Awake/alert Behavior During Therapy:  Flat affect Overall Cognitive Status: Impaired/Different from baseline Area of Impairment: Safety/judgement;Awareness;Problem solving                         Safety/Judgement: Decreased awareness of safety;Decreased awareness of deficits Awareness:  Intellectual Problem Solving: Slow processing;Requires verbal cues;Requires tactile cues General Comments: patient with poor insight throughout session, slow to respond at times despite cues.      General Comments      Exercises     Assessment/Plan    PT Assessment Patient needs continued PT services  PT Problem List Decreased strength;Decreased activity tolerance;Decreased balance;Decreased mobility;Decreased cognition;Decreased safety awareness;Obesity       PT Treatment Interventions DME instruction;Gait training;Stair training;Functional mobility training;Therapeutic activities;Therapeutic exercise;Balance training;Cognitive remediation;Patient/family education    PT Goals (Current goals can be found in the Care Plan section)  Acute Rehab PT Goals Patient Stated Goal: to go home PT Goal Formulation: With patient/family Time For Goal Achievement: 02/15/17 Potential to Achieve Goals: Good    Frequency Min 3X/week   Barriers to discharge        Co-evaluation PT/OT/SLP Co-Evaluation/Treatment: Yes Reason for Co-Treatment: Complexity of the patient's impairments (multi-system involvement);Necessary to address cognition/behavior during functional activity;For patient/therapist safety PT goals addressed during session: Mobility/safety with mobility OT goals addressed during session: ADL's and self-care       AM-PAC PT "6 Clicks" Daily Activity  Outcome Measure Difficulty turning over in bed (including adjusting bedclothes, sheets and blankets)?: Total Difficulty moving from lying on back to sitting on the side of the bed? : Total Difficulty sitting down on and standing up from a chair with arms (e.g., wheelchair, bedside commode, etc,.)?: Total Help needed moving to and from a bed to chair (including a wheelchair)?: A Little Help needed walking in hospital room?: A Lot Help needed climbing 3-5 steps with a railing? : A Lot 6 Click Score: 10    End of Session Equipment  Utilized During Treatment: Gait belt Activity Tolerance: Patient tolerated treatment well Patient left: in chair;with call bell/phone within reach;with family/visitor present Nurse Communication: Mobility status PT Visit Diagnosis: Difficulty in walking, not elsewhere classified (R26.2)    Time: 1610-9604 PT Time Calculation (min) (ACUTE ONLY): 21 min   Charges:   PT Evaluation $PT Eval Moderate Complexity: 1 Procedure     PT G CodesAlben Deeds, PT DPT NCS [phone]   Duncan Dull 02/01/2017, 1:47 PM

## 2017-02-02 DIAGNOSIS — D62 Acute posthemorrhagic anemia: Secondary | ICD-10-CM

## 2017-02-02 DIAGNOSIS — N189 Chronic kidney disease, unspecified: Secondary | ICD-10-CM

## 2017-02-02 DIAGNOSIS — K922 Gastrointestinal hemorrhage, unspecified: Secondary | ICD-10-CM

## 2017-02-02 DIAGNOSIS — R5381 Other malaise: Secondary | ICD-10-CM

## 2017-02-02 LAB — CBC
HCT: 24.3 % — ABNORMAL LOW (ref 36.0–46.0)
HCT: 22.9 % — ABNORMAL LOW (ref 36.0–46.0)
HEMOGLOBIN: 7.2 g/dL — AB (ref 12.0–15.0)
Hemoglobin: 7.9 g/dL — ABNORMAL LOW (ref 12.0–15.0)
MCH: 28 pg (ref 26.0–34.0)
MCH: 27 pg (ref 26.0–34.0)
MCHC: 32.5 g/dL (ref 30.0–36.0)
MCHC: 31.4 g/dL (ref 30.0–36.0)
MCV: 86.2 fL (ref 78.0–100.0)
MCV: 85.8 fL (ref 78.0–100.0)
Platelets: 544 10*3/uL — ABNORMAL HIGH (ref 150–400)
Platelets: 584 10*3/uL — ABNORMAL HIGH (ref 150–400)
RBC: 2.82 MIL/uL — ABNORMAL LOW (ref 3.87–5.11)
RBC: 2.67 MIL/uL — ABNORMAL LOW (ref 3.87–5.11)
RDW: 16.7 % — AB (ref 11.5–15.5)
RDW: 16.7 % — ABNORMAL HIGH (ref 11.5–15.5)
WBC: 19.2 10*3/uL — ABNORMAL HIGH (ref 4.0–10.5)
WBC: 20.9 10*3/uL — ABNORMAL HIGH (ref 4.0–10.5)

## 2017-02-02 LAB — GLUCOSE, CAPILLARY
GLUCOSE-CAPILLARY: 307 mg/dL — AB (ref 65–99)
GLUCOSE-CAPILLARY: 270 mg/dL — AB (ref 65–99)
Glucose-Capillary: 221 mg/dL — ABNORMAL HIGH (ref 65–99)
Glucose-Capillary: 217 mg/dL — ABNORMAL HIGH (ref 65–99)

## 2017-02-02 LAB — BASIC METABOLIC PANEL
ANION GAP: 11 (ref 5–15)
BUN: 112 mg/dL — ABNORMAL HIGH (ref 6–20)
CHLORIDE: 97 mmol/L — AB (ref 101–111)
CO2: 21 mmol/L — AB (ref 22–32)
CREATININE: 1.81 mg/dL — AB (ref 0.44–1.00)
Calcium: 8.7 mg/dL — ABNORMAL LOW (ref 8.9–10.3)
GFR calc non Af Amer: 28 mL/min — ABNORMAL LOW (ref >60)
GFR, EST AFRICAN AMERICAN: 32 mL/min — AB (ref >60)
Glucose, Bld: 350 mg/dL — ABNORMAL HIGH (ref 65–99)
POTASSIUM: 4.9 mmol/L (ref 3.5–5.1)
SODIUM: 129 mmol/L — AB (ref 135–145)

## 2017-02-02 MED ORDER — FAMOTIDINE IN NACL 20-0.9 MG/50ML-% IV SOLN
20.0000 mg | Freq: Two times a day (BID) | INTRAVENOUS | Status: DC
  Administered 2017-02-02 – 2017-02-03 (×3): 20 mg via INTRAVENOUS
  Filled 2017-02-02 (×3): qty 50

## 2017-02-02 MED ORDER — PANTOPRAZOLE SODIUM 40 MG IV SOLR: 40.0000 mg | Freq: Two times a day (BID) | INTRAVENOUS | Status: DC

## 2017-02-02 MED ORDER — SODIUM CHLORIDE 0.9 % IV SOLN
INTRAVENOUS | Status: AC
  Administered 2017-02-02: 12:00:00 via INTRAVENOUS

## 2017-02-02 MED ORDER — SODIUM CHLORIDE 0.9% FLUSH
10.0000 mL | Freq: Two times a day (BID) | INTRAVENOUS | Status: DC
  Administered 2017-02-03: 10 mL

## 2017-02-02 MED ORDER — INSULIN GLARGINE 100 UNIT/ML ~~LOC~~ SOLN
50.0000 [IU] | Freq: Two times a day (BID) | SUBCUTANEOUS | Status: DC
  Administered 2017-02-02 – 2017-02-06 (×9): 50 [IU] via SUBCUTANEOUS
  Filled 2017-02-02 (×11): qty 0.5

## 2017-02-02 MED ORDER — SODIUM CHLORIDE 0.9% FLUSH
10.0000 mL | INTRAVENOUS | Status: DC | PRN
  Administered 2017-02-03: 10 mL
  Filled 2017-02-02: qty 40

## 2017-02-02 MED ORDER — INSULIN ASPART 100 UNIT/ML ~~LOC~~ SOLN
4.0000 [IU] | Freq: Three times a day (TID) | SUBCUTANEOUS | Status: DC
  Administered 2017-02-02 – 2017-02-04 (×8): 4 [IU] via SUBCUTANEOUS

## 2017-02-02 NOTE — Progress Notes (Signed)
Notified Schorr, NP of patient hemoglobin 7.2, given care instruction to notify of lab results at [place]. Will continue to monitor.

## 2017-02-02 NOTE — Progress Notes (Signed)
PROGRESS NOTE   Crystal [name]  JTT:[id number]    DOB: Mar 19, 1951    DOA: 01/26/2017  PCP: Teofilo Pod., PA-C   I have briefly reviewed patients previous medical records in [facility].  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 Extubated 01/29/17 and transferred to [facility] service on 01/31/17. CCS following. An episode of "Black" emesis on 7/12 morning.   Assessment & Plan:   Active Problems:   Nonischemic cardiomyopathy (HCC)   Chronic renal insufficiency   Essential hypertension   Anemia, iron deficiency   Diabetes mellitus, type 2 (HCC)   Chronic systolic heart failure (HCC)   HLD (hyperlipidemia)   Disease of thyroid gland   Colitis   Pressure injury of skin   S/P colostomy ([place])   1. Fulminant colitis, s/p colectomy with transverse colostomy, Dr. Matt Holmes 02/17/17 at [facility]: Gen. surgery following. Open midline incision for which she is undergoing wet-to-dry dressing changes. [place] team assisting with ostomy management. Had an episode of large "black" emesis. Diet downgraded to clear liquids. Please see below. 2. Possible acute UGIB: Patient had an episode of large black emesis this morning. Changed diet to clears. IV Pepcid (IV Protonix avoided d/t  h/o Torsades). [facility] consulted.? Stress gastritis/ulcer versus other etiology. 3. Acute respiratory failure with hypoxia/VDRF: Extubated 01/29/17. Hypoxia resolved. Incentive spirometry. Chest x-ray 7/11 personally reviewed in no acute findings seen. 4. Septic shock: Due to fulminant colitis/peritonitis. Status post surgery as above. Resolved. Antibiotics completed. Blood cultures negative. 5. Chronic systolic CHF/NICM/chronic LBBB/BiV AICD: As per outpatient Cardiology follow-up notes Dr. Harl Bowie, previously severe LV systolic dysfunction that normalized with medical treatment and AICD. BiV no longer active as reached ERI, was not replaced due to normalized LV function. Echo 04/10/16: LVEF 45-50 percent and grade 1 diastolic dysfunction. ARB held due to acute on chronic kidney disease. Continue carvedilol and rosuvastatin. Reviewed EKG 7/11 with a cardiologist and corrected QTC is 420 ms. Clinically does not appear overtly volume overloaded. 6. Acute on stage III chronic kidney disease: Patient presented with creatinine of 2.17 which had improved gradually to 1.53. Baseline creatinine not known. Since creatinine continues to improve, continue to hold ARB and since not overtly volume overloaded, we will also continue to monitor off of diuretics. Creatinine has again increased to 1.8. Gentle fluids and follow BMP. 7. Type II DM: Uncontrolled on reduced than home dose of Lantus. Adjusted Lantus and Novolog. 8. Iron deficiency anemia: Hemoglobin was stable in the 9 g range but dropped to 7.9 on 7/12. Suspected acute upper GI bleed. Follow CBCs every 6 hourly and transfuse if hemoglobin <7 g per DL. 9. COPD: Stable without clinical bronchospasm. 10. Hyperlipidemia: Continue rosuvastatin 11. Essential hypertension: Controlled. Continue carvedilol and monitor. 12. Reactive thrombocytosis: Periodically follow CBCs. Stable. 13. OSA: Nightly CPAP. Patient declined CPAP. 14. Hyponatremia: Likely due to  hyperglycemia.  15. Hypothyroid: Continue Synthroid.   DVT prophylaxis: Discontinue subcutaneous heparin due to GI bleed. SCDs. Code Status: Full Family Communication: None at bedside Disposition: Initially admitted to ICU. Transferred to telemetry on 7/11. DC? CIR when medically improved and stable.   Consultants:  CCM-signed off General surgery  [facility]-pending. Rehabilitation M.D.  Procedures:  Extubated 01/29/2017 Left upper extremity PICC line.  Antimicrobials:  Completed    Subjective: When I saw her early this morning, she stated that she was doing "okay". Mild intermittent nausea but no vomiting and was tolerating soft diet. Passing flatus and stools into colostomy. Subsequently RN reported that patient had an episode of "large black emesis".  ROS: Denies dyspnea, chest pain, dizziness or lightheadedness.  Objective:  Vitals:   02/01/17 1735 02/01/17 2118 02/02/17 0506 02/02/17 0913  BP: (!) 149/55 (!) 125/51 (!) 103/42 (!) 118/51  Pulse: (!) 108 (!) 113 (!) 110 (!) 106  Resp: (!) 28 18 18    Temp: 98.8 F (37.1 C) 99.2 F (37.3 C) 99.3 F (37.4 C)   TempSrc:      SpO2: 96% 95% 93%   Weight:   97.9 kg (215 lb 12.8 oz)   Height:        Examination:  General exam: Pleasant middle-aged female, moderately built and obese lying comfortably propped up in bed. Respiratory system: Clear to auscultation without increased work of breathing. Cardiovascular system: S1 and S2 heard, mild regular tachycardia. No JVD, murmurs or pedal edema. Telemetry personally reviewed and shows sinus tachycardia in the 100s. Gastrointestinal system: Surgical dressing clean and dry. Colostomy draining minimal amount of blood stained liquid. Not distended. Soft. Mild appropriate postop tenderness. No organomegaly or masses felt. Normal bowel sounds heard. Improving and stable without change. Central nervous system: Alert and oriented. No focal neurological deficits. Stable without  change. Extremities: Symmetric 5 x 5 power. Skin: No rashes, lesions or ulcers Psychiatry: Judgement and insight appear normal. Mood & affect appropriate.     Data Reviewed: I have personally reviewed following labs and imaging studies  CBC:  Recent Labs Lab 01/26/17 1400 01/27/17 0505  01/29/17 0312 01/30/17 0335 01/31/17 0337 02/01/17 0426 02/02/17 1032  WBC 15.7* 14.6*  < > 16.7* 13.9* 10.7* 12.3* 19.2*  NEUTROABS 12.3* 11.2*  --  14.5* 10.8*  --  8.8*  --   HGB 8.5* 8.3*  < > 9.0* 8.9* 9.1* 9.8* 7.9*  HCT 26.2* 27.2*  < > 29.1* 28.5* 28.7* 31.0* 24.3*  MCV 87.0 86.9  < > 88.2 87.4 87.0 86.4 86.2  PLT 330 316  < > 522* 538* 547* 571* 544*  < > = values in this interval not displayed. Basic Metabolic Panel:  Recent Labs Lab 01/28/17 1616 01/29/17 0312 01/29/17 1603 01/30/17 0335 01/31/17 0337 02/01/17 0426 02/02/17 0525  NA  --  138 141 143 135 135 129*  K  --  5.2* 4.5 4.2 4.2 4.4 4.9  CL  --  96* 98* 99* 95* 99* 97*  CO2  --  24 27 28 28 26  21*  GLUCOSE  --  408* 162* 135* 130* 189* 350*  BUN  --  111* 121* 128* 103* 67* 112*  CREATININE  --  2.97* 2.96* 2.73* 1.96* 1.53* 1.81*  CALCIUM  --  9.3 9.6 9.4 9.2 9.2 8.7*  MG 2.2 2.4 2.5*  --  2.7* 2.4  --   PHOS 5.4* 5.9* 4.7*  --  4.5 3.4  --    Liver Function Tests:  Recent  Labs Lab 01/26/17 1400 01/27/17 0505 02/01/17 0426  AST 25 22 20   ALT 13* 13* 17  ALKPHOS 133* 131* 131*  BILITOT 0.9 0.8 0.9  PROT 6.6 6.9 7.9  ALBUMIN 3.6 3.5 3.7   Coagulation Profile:  Recent Labs Lab 01/26/17 1400  INR 1.17   Cardiac Enzymes:  Recent Labs Lab 01/26/17 1400  TROPONINI <0.03   HbA1C: No results for input(s): HGBA1C in the last 72 hours. CBG:  Recent Labs Lab 02/01/17 1211 02/01/17 1627 02/01/17 1756 02/01/17 2217 02/02/17 0746  GLUCAP 306* 340* 374* 419* 307*    Recent Results (from the past 240 hour(s))  MRSA PCR Screening     Status: None   Collection Time: 01/26/17  2:17 PM   Result Value Ref Range Status   MRSA by PCR NEGATIVE NEGATIVE Final    Comment:        The GeneXpert MRSA Assay (FDA approved for NASAL specimens only), is one component of a comprehensive MRSA colonization surveillance program. It is not intended to diagnose MRSA infection nor to guide or monitor treatment for MRSA infections.   Culture, blood (routine x 2)     Status: None   Collection Time: 01/26/17  4:05 PM  Result Value Ref Range Status   Specimen Description BLOOD RIGHT ANTECUBITAL  Final   Special Requests   Final    BOTTLES DRAWN AEROBIC AND ANAEROBIC Blood Culture adequate volume   Culture NO GROWTH 5 DAYS  Final   Report Status 01/31/2017 FINAL  Final  Culture, blood (routine x 2)     Status: None   Collection Time: 01/26/17  4:15 PM  Result Value Ref Range Status   Specimen Description BLOOD RIGHT ANTECUBITAL  Final   Special Requests   Final    BOTTLES DRAWN AEROBIC AND ANAEROBIC Blood Culture adequate volume   Culture NO GROWTH 5 DAYS  Final   Report Status 01/31/2017 FINAL  Final  Urine culture     Status: Abnormal   Collection Time: 01/26/17  5:39 PM  Result Value Ref Range Status   Specimen Description URINE, CLEAN CATCH  Final   Special Requests NONE  Final   Culture >=100,000 COLONIES/mL YEAST (A)  Final   Report Status 01/27/2017 FINAL  Final  Culture, respiratory (tracheal aspirate)     Status: None   Collection Time: 01/27/17  9:29 AM  Result Value Ref Range Status   Specimen Description TRACHEAL ASPIRATE  Final   Special Requests NONE  Final   Gram Stain   Final    MODERATE WBC PRESENT, PREDOMINANTLY PMN NO SQUAMOUS EPITHELIAL CELLS SEEN RARE BUDDING YEAST SEEN    Culture FEW CANDIDA ALBICANS  Final   Report Status 01/29/2017 FINAL  Final         Radiology Studies: Dg Chest Port 1 View  Result Date: 02/01/2017 CLINICAL DATA:  Shortness of breath. EXAM: PORTABLE CHEST 1 VIEW COMPARISON:  Radiographs of January 29, 2017. FINDINGS: Stable  cardiomediastinal silhouette. Left-sided pacemaker is unchanged in position. No pneumothorax or pleural effusion is noted. No consolidative process is noted. Bony thorax is unremarkable. IMPRESSION: No acute cardiopulmonary abnormality seen. Electronically Signed   By: Marijo Conception, M.D.   On: 02/01/2017 07:12        Scheduled Meds: . carvedilol  6.25 mg Oral BID WC  . febuxostat  40 mg Oral Daily  . gabapentin  100 mg Oral q morning - 10a  . gabapentin  400 mg Oral QHS  .  heparin  5,000 Units Subcutaneous Q8H  . insulin aspart  0-15 Units Subcutaneous TID WC  . insulin aspart  0-5 Units Subcutaneous QHS  . insulin aspart  4 Units Subcutaneous TID WC  . insulin glargine  50 Units Subcutaneous BID  . levothyroxine  88 mcg Oral QAC breakfast  . loratadine  10 mg Oral Daily  . montelukast  10 mg Oral QHS  . omega-3 acid ethyl esters  4 g Oral Daily  . rosuvastatin  10 mg Oral QHS   Continuous Infusions: . sodium chloride    . famotidine (PEPCID) IV       LOS: 7 days     Malani Lees, MD, FACP, FHM. [facility] Pager [phone]  If 7PM-7AM, please contact night-coverage [email or web address] Password [email or web address] 02/02/2017, 11:13 AM

## 2017-02-02 NOTE — Consult Note (Signed)
Physical Medicine and Rehabilitation Consult Reason for Consult: Decreased functional mobility with debilitation Referring Physician: [facility]   HPI: Crystal [name] is a 66 y.o. right handed female with history of diastolic congestive heart failure, nonischemic cardiomyopathy with defibrillator, CKD stage III, diabetes mellitus, OSA with CPAP, hypertension. Per chart review patient lives in [place]. Independent working part-time prior to admission. She plans to stay with her daughter in [place] on discharge. Daughter works during the day but family in the area to provide assistance as needed. Patient presented to [facility]/18/2017 with severe right lower quadrant pain. Per report CT showed distended colon presumed colitis. 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 Hospital course septic shock started on TPN. Patient required ongoing IV fluids started on pressors. . Repeat CT scan of the abdomen 01/21/2017 showed mild free abdominal and pelvic fluid without evidence of abscess. Patient was transferred to [facility] 01/26/2017 for ongoing care. Follow-up CT abdomen and pelvis 01/27/2017 showed subtotal colectomy right abdominal proximal transverse colostomy. Small to moderate volume fluid in the peritoneal cavity.WOC consulted with wet-to-dry dressings. Patient was extubated 01/29/2017 followed by critical care medicine. Blood cultures remain negative broad-spectrum antibiotics later discontinued. Acute blood loss anemia 9.8 and monitored. Leukocytosis 16,700 improved at 12,300. Creatinine improved from 2.96-1.53. Diet has slowly been advanced to mechanical soft. Physical and occupational therapy evaluations completed 02/01/2017 with recommendations of physical medicine rehabilitation consult.   Review of  Systems  Constitutional: Negative for chills and fever.  HENT: Negative for hearing loss.   Eyes: Negative for blurred vision and double vision.  Respiratory: Positive for shortness of breath.   Cardiovascular: Positive for leg swelling. Negative for chest pain and palpitations.  Gastrointestinal: Positive for abdominal pain and nausea. Negative for vomiting.  Genitourinary: Negative for dysuria and hematuria.  Musculoskeletal: Positive for back pain, joint pain and myalgias.  Skin: Negative for rash.  Neurological: Positive for weakness.  All other systems reviewed and are negative.  Past Medical History:  Diagnosis Date  . Anemia, iron deficiency   . Angioedema    felt to likely be due to ace inhibitors but says she has had this even off of medicines, appears to be tolerating ARBs chronically  . Automatic implantable cardiac defibrillator in situ    high RV threshold chronically, device was turned off in 2014  . Back pain   . CHF (congestive heart failure) ([place])   . Chronic renal insufficiency   . Diabetes mellitus, type 2 ([place])   . Gout   . Hyperlipidemia   . Hypertension   . Intervertebral disc degeneration   . LBBB (left bundle branch block)   . Nonischemic cardiomyopathy ([place])    s/p ICD in 2005. EF has since recovered  . Obesity   . Systemic hypertension   . Unspecified hypothyroidism    Past Surgical History:  Procedure Laterality Date  . CARDIAC CATHETERIZATION  2005   no obstructive CAD per patient  . CARDIAC DEFIBRILLATOR PLACEMENT  2005   BiV ICD implanted,  LV lead is an epicardial lead  . CYST REMOVAL HAND Right 05/2013   thumb  . IMPLANTABLE CARDIOVERTER DEFIBRILLATOR GENERATOR CHANGE  2008  . TOTAL ABDOMINAL HYSTERECTOMY    . TRIGGER FINGER RELEASE  05/213  . VESICOVAGINAL FISTULA CLOSURE W/ TAH     Family History  Problem Relation Age of Onset  . Hypertension Father   . Heart disease Father   . Cancer Father        prostate  . Heart disease  Other        (Maternal side) Ischemic heart disease  . Diabetes Mellitus II Other   . Arrhythmia Mother   . Diabetes Mellitus II Mother        Borderline DM  . Hypertension Mother    Social History:  reports that she has never smoked. She has never used smokeless tobacco. She reports that she does not drink alcohol or use drugs. Allergies:  Allergies  Allergen Reactions  . Glucophage [Metformin Hcl] Other (See Comments)    Renal failure  . Ace Inhibitors Swelling and Other (See Comments)    Angioedema  . Advicor [Niacin-Lovastatin Er] Other (See Comments)    Muscle aches  . Bystolic [Nebivolol Hcl] Other (See Comments)    Edema  . Erythromycin Diarrhea, Nausea And Vomiting and Other (See Comments)    *DERIVATIVES*  . Lipitor [Atorvastatin] Other (See Comments)    Muscle aches  . Lopid [Gemfibrozil] Other (See Comments)    Muscle aches  . Statins Other (See Comments)    Muscle aches  . Adhesive [Tape] Rash   Medications Prior to Admission  Medication Sig Dispense Refill  . aspirin 81 MG tablet Take 81 mg by mouth daily.    . bumetanide (BUMEX) 1 MG tablet Take 1 mg by mouth 2 (two) times daily.    . carvedilol (COREG) 12.5 MG tablet Take 12.5 mg by mouth 2 (two) times daily with a meal.     . CRESTOR 10 MG tablet Take 10 mg by mouth at bedtime.     . gabapentin (NEURONTIN) 100 MG capsule Take 100-400 mg by mouth See admin instructions. Take 100 mg by mouth in the morning and take 400 mg by mouth at bedtime    . Insulin Glargine (TOUJEO SOLOSTAR) 300 UNIT/ML SOPN Inject 100 Units into the skin daily.     . insulin glulisine (APIDRA) 100 UNIT/ML injection Inject 20 Units into the skin 3 (three) times daily with meals.     Marland Kitchen levothyroxine (SYNTHROID, LEVOTHROID) 88 MCG tablet Take 88 mcg by mouth daily.     Marland Kitchen linagliptin (TRADJENTA) 5 MG TABS tablet Take 5 mg by mouth daily.    Marland Kitchen LINZESS 145 MCG CAPS capsule Take 145 mcg by mouth daily.     Marland Kitchen loratadine (CLARITIN) 10 MG tablet  Take 10 mg by mouth daily.    . Magnesium 400 MG TABS Take 400 mg by mouth 2 (two) times daily.     . montelukast (SINGULAIR) 10 MG tablet Take 10 mg by mouth at bedtime.    Marland Kitchen omega-3 acid ethyl esters (LOVAZA) 1 g capsule Take 4 g by mouth daily.    . temazepam (RESTORIL) 15 MG capsule Take 15 mg by mouth at bedtime as needed for sleep.     Marland Kitchen ULORIC 40 MG tablet Take 40 mg by mouth daily.     . valsartan (DIOVAN) 160 MG tablet Take 160 mg by mouth daily.     . Vitamin D, Ergocalciferol, (DRISDOL) 50000 units CAPS capsule Take 50,000 Units by mouth once a week.   3  . acetaminophen-codeine (TYLENOL #3) 300-30 MG tablet Take 1 tablet by mouth 2 (two) times daily as needed for moderate pain.     . furosemide (LASIX) 20 MG tablet Take 1 tablet (20  mg total) by mouth daily. (Patient not taking: Reported on 01/26/2017) 90 tablet 3    Home: Home Living Family/patient expects to be discharged to:: Private residence Living Arrangements: Children Available Help at Discharge: Family Type of Home: House Home Access: Level entry Home Layout: Bed/bath upstairs, Full bath on main level, Two level Alternate Level Stairs-Number of Steps: 15 Alternate Level Stairs-Rails: Can reach both Bathroom Shower/Tub: Multimedia programmer: Standard Home Equipment: None  Functional History: Prior Function Level of Independence: Independent Functional Status:  Mobility: Bed Mobility Overal bed mobility: Needs Assistance Bed Mobility: Supine to Sit Supine to sit: Mod assist, HOB elevated General bed mobility comments: moderate assist to elevate trunk to upright and rotate hips to EOB Transfers Overall transfer level: Needs assistance Equipment used: Rolling walker (2 wheeled) Transfers: Sit to/from Stand, [employer] Transfers Sit to Stand: Mod assist, +2 physical assistance, +2 safety/equipment Stand pivot transfers: Mod assist, +2 physical assistance, +2 safety/equipment General transfer comment:  multimodal cues for hand placement and power up to standing. Decreased ability to maintain upright, cues for facilitation of upright posture.       ADL: ADL Overall ADL's : Needs assistance/impaired Eating/Feeding: Sitting, Set up Grooming: Minimal assistance, Sitting Upper Body Bathing: Sitting, Moderate assistance Lower Body Bathing: Total assistance, +2 for physical assistance, Sit to/from stand Upper Body Dressing : Sitting, Moderate assistance Lower Body Dressing: Total assistance, +2 for physical assistance, Sit to/from stand Toilet Transfer: +2 for physical assistance, Moderate assistance, Stand-pivot, RW Toilet Transfer Details (indicate cue type and reason): simulated to chair Toileting- Clothing Manipulation and Hygiene: Total assistance, +2 for physical assistance, Sit to/from stand Functional mobility during ADLs:  (not able to ambulate)  Cognition: Cognition Overall Cognitive Status: Impaired/Different from baseline Orientation Level: Oriented X4 Cognition Arousal/Alertness: Awake/alert Behavior During Therapy: Flat affect Overall Cognitive Status: Impaired/Different from baseline Area of Impairment: Safety/judgement, Awareness, Problem solving Safety/Judgement: Decreased awareness of safety, Decreased awareness of deficits Awareness: Intellectual Problem Solving: Slow processing, Requires verbal cues, Requires tactile cues General Comments: patient with poor insight throughout session, slow to respond at times despite cues.  Blood pressure (!) 103/42, pulse (!) 110, temperature 99.3 F (37.4 C), resp. rate 18, height 5\' 5"  (1.651 m), weight 97.9 kg (215 lb 12.8 oz), SpO2 93 %. Physical Exam  Vitals reviewed. Constitutional: She is oriented to person, place, and time.  66 year old obese female  HENT:  Head: Normocephalic.  Eyes: EOM are normal.  Neck: Normal range of motion. Neck supple. No thyromegaly present.  Cardiovascular: Normal rate and regular rhythm.     Respiratory: Effort normal and breath sounds normal.  GI: Soft. Bowel sounds are normal.  Colostomy intact with open midline dressing in place  Neurological: She is alert and oriented to person, place, and time. No cranial nerve deficit.  UE 4/5 prox to distal. LE: 3/5 HF,  3/5 KE and 4-/5 ADF/PF.   Psychiatric:  Slow to arouse    Results for orders placed or performed during the hospital encounter of 01/26/17 (from the past 24 hour(s))  Glucose, capillary     Status: Abnormal   Collection Time: 02/01/17  8:09 AM  Result Value Ref Range   Glucose-Capillary 225 (H) 65 - 99 mg/dL   Comment 1 Capillary Specimen    Comment 2 Notify RN   Glucose, capillary     Status: Abnormal   Collection Time: 02/01/17 12:11 PM  Result Value Ref Range   Glucose-Capillary 306 (H) 65 - 99 mg/dL  Comment 1 Capillary Specimen    Comment 2 Notify RN   Glucose, capillary     Status: Abnormal   Collection Time: 02/01/17  4:27 PM  Result Value Ref Range   Glucose-Capillary 340 (H) 65 - 99 mg/dL   Comment 1 Capillary Specimen    Comment 2 Notify RN   Glucose, capillary     Status: Abnormal   Collection Time: 02/01/17  5:56 PM  Result Value Ref Range   Glucose-Capillary 374 (H) 65 - 99 mg/dL  Glucose, capillary     Status: Abnormal   Collection Time: 02/01/17 10:17 PM  Result Value Ref Range   Glucose-Capillary 419 (H) 65 - 99 mg/dL   Dg Chest Port 1 View  Result Date: 02/01/2017 CLINICAL DATA:  Shortness of breath. EXAM: PORTABLE CHEST 1 VIEW COMPARISON:  Radiographs of January 29, 2017. FINDINGS: Stable cardiomediastinal silhouette. Left-sided pacemaker is unchanged in position. No pneumothorax or pleural effusion is noted. No consolidative process is noted. Bony thorax is unremarkable. IMPRESSION: No acute cardiopulmonary abnormality seen. Electronically Signed   By: Marijo Conception, M.D.   On: 02/01/2017 07:12    Assessment/Plan: Diagnosis: debility after colitis/colectomy.multiple medical 1. Does  the need for close, 24 hr/day medical supervision in concert with the patient's rehab needs make it unreasonable for this patient to be served in a less intensive setting? Yes 2. Co-Morbidities requiring supervision/potential complications: morbid obesity, ostomy, nutrition 3. Due to bladder management, bowel management, safety, skin/wound care, disease management, medication administration, pain management and patient education, does the patient require 24 hr/day rehab nursing? Yes 4. Does the patient require coordinated care of a physician, rehab nurse, PT (1-2 hrs/day, 5 days/week) and OT (1-2 hrs/day, 5 days/week) to address physical and functional deficits in the context of the above medical diagnosis(es)? Yes Addressing deficits in the following areas: balance, endurance, locomotion, strength, transferring, bowel/bladder control, bathing, dressing, feeding, grooming, toileting and psychosocial support 5. Can the patient actively participate in an intensive therapy program of at least 3 hrs of therapy per day at least 5 days per week? Yes 6. The potential for patient to make measurable gains while on inpatient rehab is excellent 7. Anticipated functional outcomes upon discharge from inpatient rehab are supervision and min assist  with PT, supervision and min assist with OT, n/a with SLP. 8. Estimated rehab length of stay to reach the above functional goals is: 12-17 days 9. Anticipated D/C setting: Home 10. Anticipated post D/C treatments: [facility] 11. Overall Rehab/Functional Prognosis: excellent  RECOMMENDATIONS: This patient's condition is appropriate for continued rehabilitative care in the following setting: CIR Patient has agreed to participate in recommended program. Yes Note that insurance prior authorization may be required for reimbursement for recommended care.  Comment: Rehab Admissions Coordinator to follow up.  Thanks,  [name], MD, Mellody Drown    Cathlyn Parsons., PA-C 02/02/2017

## 2017-02-02 NOTE — Consult Note (Signed)
[facility] Gastroenterology Consultation Note  Referring Provider: Dr. Vernell Leep [facility]) Primary Care Physician:  Teofilo Pod., PA-C  Reason for Consultation:  Black emesis  HPI: Crystal [name] is a 66 y.o. female with acute bowel blockage (per family) versus colitis (per chart) requiring emergency surgery (partial colectomy + colostomy).  Post-operatively, patient with some issues with post-prandial nausea.  No bleeding until recent episode of large volume black emesis.  No abdominal pain (other than post-operative soreness).  No red hematemesis.  Dark green effluent per colostomy bag.  Reports endoscopy and colonoscopy years ago, unclear etiology.   Past Medical History:  Diagnosis Date  . Anemia, iron deficiency   . Angioedema    felt to likely be due to ace inhibitors but says she has had this even off of medicines, appears to be tolerating ARBs chronically  . Automatic implantable cardiac defibrillator in situ    high RV threshold chronically, device was turned off in 2014  . Back pain   . CHF (congestive heart failure) ([place])   . Chronic renal insufficiency   . Diabetes mellitus, type 2 ([place])   . Gout   . Hyperlipidemia   . Hypertension   . Intervertebral disc degeneration   . LBBB (left bundle branch block)   . Nonischemic cardiomyopathy ([place])    s/p ICD in 2005. EF has since recovered  . Obesity   . Systemic hypertension   . Unspecified hypothyroidism     Past Surgical History:  Procedure Laterality Date  . CARDIAC CATHETERIZATION  2005   no obstructive CAD per patient  . CARDIAC DEFIBRILLATOR PLACEMENT  2005   BiV ICD implanted,  LV lead is an epicardial lead  . CYST REMOVAL HAND Right 05/2013   thumb  . IMPLANTABLE CARDIOVERTER DEFIBRILLATOR GENERATOR CHANGE  2008  . TOTAL ABDOMINAL HYSTERECTOMY    . TRIGGER FINGER RELEASE  05/213  . VESICOVAGINAL FISTULA CLOSURE W/ TAH      Prior to Admission medications   Medication Sig Start Date End Date Taking?  Authorizing Provider  aspirin 81 MG tablet Take 81 mg by mouth daily.   Yes [provider]  bumetanide (BUMEX) 1 MG tablet Take 1 mg by mouth 2 (two) times daily.   Yes [provider]  carvedilol (COREG) 12.5 MG tablet Take 12.5 mg by mouth 2 (two) times daily with a meal.    Yes [provider]  CRESTOR 10 MG tablet Take 10 mg by mouth at bedtime.  10/30/14  Yes [provider]  gabapentin (NEURONTIN) 100 MG capsule Take 100-400 mg by mouth See admin instructions. Take 100 mg by mouth in the morning and take 400 mg by mouth at bedtime   Yes [provider]  Insulin Glargine (TOUJEO SOLOSTAR) 300 UNIT/ML SOPN Inject 100 Units into the skin daily.    Yes [provider]  insulin glulisine (APIDRA) 100 UNIT/ML injection Inject 20 Units into the skin 3 (three) times daily with meals.    Yes [provider]  levothyroxine (SYNTHROID, LEVOTHROID) 88 MCG tablet Take 88 mcg by mouth daily.  10/16/15  Yes [provider]  linagliptin (TRADJENTA) 5 MG TABS tablet Take 5 mg by mouth daily.   Yes [provider]  LINZESS 145 MCG CAPS capsule Take 145 mcg by mouth daily.  04/28/14  Yes [provider]  loratadine (CLARITIN) 10 MG tablet Take 10 mg by mouth daily.   Yes [provider]  Magnesium 400 MG TABS Take  400 mg by mouth 2 (two) times daily.    Yes [provider]  montelukast (SINGULAIR) 10 MG tablet Take 10 mg by mouth at bedtime.   Yes [provider]  omega-3 acid ethyl esters (LOVAZA) 1 g capsule Take 4 g by mouth daily.   Yes [provider]  temazepam (RESTORIL) 15 MG capsule Take 15 mg by mouth at bedtime as needed for sleep.    Yes [provider]  ULORIC 40 MG tablet Take 40 mg by mouth daily.  01/29/14  Yes [provider]  valsartan (DIOVAN) 160 MG tablet Take 160 mg by mouth daily.    Yes [provider]  Vitamin D, Ergocalciferol, (DRISDOL)  50000 units CAPS capsule Take 50,000 Units by mouth once a week.  11/12/15  Yes [provider]  acetaminophen-codeine (TYLENOL #3) 300-30 MG tablet Take 1 tablet by mouth 2 (two) times daily as needed for moderate pain.  01/21/16   [provider]  furosemide (LASIX) 20 MG tablet Take 1 tablet (20 mg total) by mouth daily. Patient not taking: Reported on 01/26/2017 03/16/16   Arnoldo Lenis, MD    Current Facility-Administered Medications  Medication Dose Route Frequency Provider Last Rate Last Dose  . 0.9 %  sodium chloride infusion  250 mL Intravenous PRN Corey Harold, NP      . 0.9 %  sodium chloride infusion   Intravenous Continuous Modena Jansky, MD 60 mL/hr at 02/02/17 1201    . acetaminophen (TYLENOL) solution 650 mg  650 mg Oral Q4H PRN Hongalgi, Anand D, MD      . carvedilol (COREG) tablet 6.25 mg  6.25 mg Oral BID WC Simonne Maffucci B, MD   6.25 mg at 02/02/17 0913  . famotidine (PEPCID) IVPB 20 mg premix  20 mg Intravenous Q12H Modena Jansky, MD   Stopped at 02/02/17 1226  . febuxostat (ULORIC) tablet 40 mg  40 mg Oral Daily Modena Jansky, MD   40 mg at 02/02/17 0913  . fentaNYL (SUBLIMAZE) injection 12.5-25 mcg  12.5-25 mcg Intravenous Q2H PRN Juanito Doom, MD   25 mcg at 02/01/17 2006  . gabapentin (NEURONTIN) capsule 100 mg  100 mg Oral q morning - 10a Modena Jansky, MD   100 mg at 02/02/17 0913  . gabapentin (NEURONTIN) capsule 400 mg  400 mg Oral QHS Modena Jansky, MD   400 mg at 02/01/17 2244  . insulin aspart (novoLOG) injection 0-15 Units  0-15 Units Subcutaneous TID WC Modena Jansky, MD   8 Units at 02/02/17 1155  . insulin aspart (novoLOG) injection 0-5 Units  0-5 Units Subcutaneous QHS Hongalgi, Anand D, MD      . insulin aspart (novoLOG) injection 4 Units  4 Units Subcutaneous TID WC Modena Jansky, MD   4 Units at 02/02/17 1156  . insulin glargine (LANTUS) injection 50 Units  50 Units Subcutaneous BID Modena Jansky, MD   50 Units at 02/02/17 1156  . levothyroxine (SYNTHROID, LEVOTHROID) tablet 88 mcg  88 mcg Oral QAC breakfast Raiford Noble [place], DO   88 mcg at 02/02/17 0913  . loratadine (CLARITIN) tablet 10 mg  10 mg Oral Daily Modena Jansky, MD   10 mg at 02/02/17 0913  . montelukast (SINGULAIR) tablet 10 mg  10 mg Oral QHS Sheikh, Georgina Quint [place], DO   10 mg at 02/01/17 2245  . omega-3 acid ethyl esters (LOVAZA) capsule 4 g  4  g Oral Daily Raiford Noble [place], [place]   4 g at 02/02/17 0354  . promethazine (PHENERGAN) tablet 12.5 mg  12.5 mg Oral Q8H PRN Modena Jansky, MD   12.5 mg at 02/02/17 0951  . rosuvastatin (CRESTOR) tablet 10 mg  10 mg Oral QHS Sheikh, Georgina Quint [place], DO   10 mg at 02/01/17 2245    Allergies as of 01/26/2017 - Review Complete 01/26/2017  Allergen Reaction Noted  . Glucophage [metformin hcl] Other (See Comments) 01/27/2014  . Ace inhibitors Swelling and Other (See Comments) 01/27/2014  . Advicor [niacin-lovastatin er] Other (See Comments) 01/27/2014  . Bystolic [nebivolol hcl] Other (See Comments) 01/27/2014  . Erythromycin Diarrhea, Nausea And Vomiting, and Other (See Comments) 01/27/2014  . Lipitor [atorvastatin] Other (See Comments) 01/27/2014  . Lopid [gemfibrozil] Other (See Comments) 01/27/2014  . Statins Other (See Comments) 01/30/2014  . Adhesive [tape] Rash 01/26/2017    Family History  Problem Relation Age of Onset  . Hypertension Father   . Heart disease Father   . Cancer Father        prostate  . Heart disease Other        (Maternal side) Ischemic heart disease  . Diabetes Mellitus II Other   . Arrhythmia Mother   . Diabetes Mellitus II Mother        Borderline DM  . Hypertension Mother     Social History   Social History  . Marital status: Widowed    Spouse name: N/A  . Number of children: N/A  . Years of education: N/A   Occupational History  .      retired Corporate treasurer   Social History Main Topics  . Smoking status: Never Smoker  . Smokeless  tobacco: Never Used  . Alcohol use No  . Drug use: No  . Sexual activity: Not on file   Other Topics Concern  . Not on file   Social History Narrative   Pt lives in [place].  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    Review of Systems: As per HPI, all others negative  Physical Exam: Vital signs in last 24 hours: Temp:  [98.8 F (37.1 C)-99.3 F (37.4 C)] 99.3 F (37.4 C) (07/12 0506) Pulse Rate:  [104-113] 106 (07/12 0913) Resp:  [18-35] 18 (07/12 0506) BP: (103-149)/(42-80) 118/51 (07/12 0913) SpO2:  [93 %-96 %] 93 % (07/12 0506) Weight:  [97.9 kg (215 lb 12.8 oz)] 97.9 kg (215 lb 12.8 oz) (07/12 0506) Last BM Date: 02/01/17 General:   Alert, overweight, deconditioned, chronically ill-appearing but in NAD Head:  Normocephalic and atraumatic. Eyes:  Sclera clear, no icterus.   Conjunctiva pink. Ears:  Normal auditory acuity. Nose:  No deformity, discharge,  or lesions. Mouth:  No deformity or lesions.  Oropharynx pink & dry Neck:  Supple; no masses or thyromegaly. Lungs:  Clear throughout to auscultation.   No wheezes, crackles, or rhonchi. No acute distress. Heart:  Regular rate and rhythm; no murmurs, clicks, rubs,  or gallops. Abdomen:  Soft, mild distended and protuberant; vertical midline incision with bandages; right -sided colostomy with dark green liquid output. No masses, hepatosplenomegaly or hernias noted. Normal bowel sounds, without guarding, and without rebound.     Msk:  Symmetrical without gross deformities. Normal posture. Pulses:  Normal pulses noted. Extremities:  Without clubbing or edema. Neurologic:  Alert and  oriented x4; diffusely weak, otherwise grossly normal neurologically. Skin:  Intact without  significant lesions or rashes. Psych:  Alert and cooperative. Normal mood and affect.   Lab Results:  Recent Labs  01/31/17 0337 02/01/17 0426 02/02/17 1032  WBC 10.7* 12.3* 19.2*  HGB 9.1*  9.8* 7.9*  HCT 28.7* 31.0* 24.3*  PLT 547* 571* 544*   BMET  Recent Labs  01/31/17 0337 02/01/17 0426 02/02/17 0525  NA 135 135 129*  K 4.2 4.4 4.9  CL 95* 99* 97*  CO2 28 26 21*  GLUCOSE 130* 189* 350*  BUN 103* 67* 112*  CREATININE 1.96* 1.53* 1.81*  CALCIUM 9.2 9.2 8.7*   LFT  Recent Labs  02/01/17 0426  PROT 7.9  ALBUMIN 3.7  AST 20  ALT 17  ALKPHOS 131*  BILITOT 0.9   PT/INR No results for input(s): LABPROT, INR in the last 72 hours.  Studies/Results: Dg Chest Port 1 View  Result Date: 02/01/2017 CLINICAL DATA:  Shortness of breath. EXAM: PORTABLE CHEST 1 VIEW COMPARISON:  Radiographs of January 29, 2017. FINDINGS: Stable cardiomediastinal silhouette. Left-sided pacemaker is unchanged in position. No pneumothorax or pleural effusion is noted. No consolidative process is noted. Bony thorax is unremarkable. IMPRESSION: No acute cardiopulmonary abnormality seen. Electronically Signed   By: Marijo Conception, M.D.   On: 02/01/2017 07:12   Impression:  1.  Partial colectomy with colostomy placement for what patient describes as "bowel blockage" with what sounds like ischemia and strangulation at outside facility. 2.  Dark emesis.  No evidence of over fresh GI tract bleeding and colostomy bag has dark green liquid output. 3.  Anemia, likely multifactorial, not convinced this is principally from acute GI tract process. 4.  Multiple medical problems.  Plan:  1.  Patient is just recovering from extensive surgery and has no evidence of destabilizing bleeding; I would not pursue endoscopy at the present time. 2.  Recommend manage medically with PPI and antiemetics and hydration and serial CBCs. 3.  If further Hgb drop and/or persistent dark black emesis, would reconsider possibility of performing endoscopy. 4.  [facility] will follow.   LOS: 7 days   Gavynn Duvall M  02/02/2017, 1:04 PM  Pager [phone] If no answer or after 5 PM call [phone]

## 2017-02-02 NOTE — Progress Notes (Signed)
RT NOTE:  Home CPAP setup @ bedside w/ 2L O2 bled in. Pt tolerating well/ RT will monitor.

## 2017-02-02 NOTE — Progress Notes (Signed)
Physical Therapy Treatment Patient Details Name: Crystal [name] MRN: [id number] DOB: 08-03-1950 Today's Date: 02/02/2017    History of Present Illness 66 year old female with PMH as below, which is significant for CHF with defibrillator in place, CKD, DM, OSA on CPAP, COPD, and Hypothyroidism. She was admitted to [facility] with severe RLQ pain on 6/25. Upon admission CT showed distended colon. She was presumed to have colitis and was admitted to the hospitalists. The following day she became more lethargic and hypotensive. She was moved to CCU and general surgery became involved. 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 Colostomy placed and abdomen left open. Post-operatively she was shocky despite 4-5L of IVF. She was started on pressors. Renal function worsened and she was diuresed with lasix drip putting out about 12-30L. At that point she was felt to be improving. Pressors able to come off. She was tolerating WUA. Family requested transfer to [facility] 7/5 when it was felt that the patient was not progressing as expected.     PT Comments    Pt needed encouragement to push herself past her comfort zone.  Emphasis on bed mobility and standing in STEDY, aborted due to blown colostomy seal.  Son and pt participated in discussion of benefits of CIR level therapies for patient.     Follow Up Recommendations  CIR     Equipment Recommendations  Rolling walker with 5" wheels    Recommendations for Other Services Rehab consult     Precautions / Restrictions Precautions Precautions: Fall    Mobility  Bed Mobility Overal bed mobility: Needs Assistance Bed Mobility: Supine to Sit;Sit to Supine     Supine to sit: Mod assist;+2 for safety/equipment Sit to supine: Mod assist;+2 for safety/equipment   General bed mobility comments: Bridging with max assist, up via L elbow with mod  to max assist  Transfers Overall transfer level: Needs assistance Equipment used: Ambulation equipment used Transfers: Sit to/from Stand Sit to Stand: Mod assist;Min assist;+2 physical assistance         General transfer comment: cues for hand placement, assist to come forward and support for full upright standing  Ambulation/Gait                 Stairs            Wheelchair Mobility    Modified Rankin (Stroke Patients Only)       Balance Overall balance assessment: Needs assistance Sitting-balance support: Feet supported Sitting balance-Leahy Scale: Fair Sitting balance - Comments: with fatigue pt listed right, but able to hold EOB for short periods   Standing balance support: Bilateral upper extremity supported Standing balance-Leahy Scale: Poor Standing balance comment: pt sit to stand x2 once from bed height, once from stedy height.  pt attained full upright posture and shifted feet around.  However, standing session cut short due to blown colostomy bag.                            Cognition Arousal/Alertness: Awake/alert Behavior During Therapy: WFL for tasks assessed/performed Overall Cognitive Status: Impaired/Different from baseline Area of Impairment: Safety/judgement;Awareness;Problem solving                         Safety/Judgement: Decreased awareness of deficits Awareness: Emergent Problem Solving: Slow processing;Requires verbal cues;Requires tactile cues  Exercises      General Comments        Pertinent Vitals/Pain Pain Assessment: No/denies pain    Home Living                      Prior Function            PT Goals (current goals can now be found in the care plan section) Acute Rehab PT Goals Patient Stated Goal: to go home PT Goal Formulation: With patient/family Time For Goal Achievement: 02/15/17 Potential to Achieve Goals: Good Progress towards PT goals: Progressing toward  goals    Frequency    Min 3X/week      PT Plan Current plan remains appropriate    Co-evaluation              AM-PAC PT "6 Clicks" Daily Activity  Outcome Measure  Difficulty turning over in bed (including adjusting bedclothes, sheets and blankets)?: Total Difficulty moving from lying on back to sitting on the side of the bed? : Total Difficulty sitting down on and standing up from a chair with arms (e.g., wheelchair, bedside commode, etc,.)?: Total Help needed moving to and from a bed to chair (including a wheelchair)?: A Lot Help needed walking in hospital room?: A Lot Help needed climbing 3-5 steps with a railing? : Total 6 Click Score: 8    End of Session   Activity Tolerance: Patient tolerated treatment well Patient left: in bed;with call bell/phone within reach;with bed alarm set;with family/visitor present Nurse Communication: Mobility status PT Visit Diagnosis: Unsteadiness on feet (R26.81);Other abnormalities of gait and mobility (R26.89);Muscle weakness (generalized) (M62.81)     Time: 8110-3159 PT Time Calculation (min) (ACUTE ONLY): 31 min  Charges:  $Therapeutic Activity: 23-37 mins                    G Codes:       19-Feb-2017  Donnella Sham, PT [phone]  (pager)   Crystal [name] 2017/02/19, 5:42 PM

## 2017-02-02 NOTE — Progress Notes (Signed)
Offered pt cpap she refused said will have familt bring hers tomorrow

## 2017-02-02 NOTE — Care Management Important Message (Signed)
Important Message  Patient Details  Name: Crystal [name] MRN: [id number] Date of Birth: [birth year]   Medicare Important Message Given:  Yes    Darnel Mchan Montine Circle 02/02/2017, 10:03 AM

## 2017-02-02 NOTE — Progress Notes (Signed)
I met with pt and her son at bedside. Pt sleeping. I discussed with son an inpt rehab admission if bed available when pt medically ready for d/c. Noted GI consult. I will follow up tomorrow. [phone]

## 2017-02-02 NOTE — Progress Notes (Signed)
Patient ID: Crystal [name], female   DOB: December 25, 1950, 66 y.o.   MRN: [id number] Had some coffee ground emesis and Hb down some. I D/W Dr. Algis Liming. Agree with clears and GI eval.  Georganna Skeans, MD, MPH, FACS Trauma: [phone] General Surgery: [phone]

## 2017-02-03 DIAGNOSIS — R338 Other retention of urine: Secondary | ICD-10-CM

## 2017-02-03 LAB — CBC
HCT: 21.2 % — ABNORMAL LOW (ref 36.0–46.0)
HCT: 22.4 % — ABNORMAL LOW (ref 36.0–46.0)
HCT: 26.5 % — ABNORMAL LOW (ref 36.0–46.0)
HEMATOCRIT: 23.6 % — AB (ref 36.0–46.0)
HEMOGLOBIN: 7.2 g/dL — AB (ref 12.0–15.0)
HEMOGLOBIN: 6.7 g/dL — AB (ref 12.0–15.0)
Hemoglobin: 7.4 g/dL — ABNORMAL LOW (ref 12.0–15.0)
Hemoglobin: 8.8 g/dL — ABNORMAL LOW (ref 12.0–15.0)
MCH: 26.8 pg (ref 26.0–34.0)
MCH: 27.1 pg (ref 26.0–34.0)
MCH: 27.7 pg (ref 26.0–34.0)
MCH: 28.4 pg (ref 26.0–34.0)
MCHC: 31.6 g/dL (ref 30.0–36.0)
MCHC: 32.1 g/dL (ref 30.0–36.0)
MCHC: 31.4 g/dL (ref 30.0–36.0)
MCHC: 33.2 g/dL (ref 30.0–36.0)
MCV: 86.2 fL (ref 78.0–100.0)
MCV: 85.8 fL (ref 78.0–100.0)
MCV: 85.5 fL (ref 78.0–100.0)
MCV: 85.5 fL (ref 78.0–100.0)
PLATELETS: 460 10*3/uL — AB (ref 150–400)
PLATELETS: 317 10*3/uL (ref 150–400)
Platelets: 458 10*3/uL — ABNORMAL HIGH (ref 150–400)
Platelets: 422 10*3/uL — ABNORMAL HIGH (ref 150–400)
RBC: 2.76 MIL/uL — ABNORMAL LOW (ref 3.87–5.11)
RBC: 2.6 MIL/uL — AB (ref 3.87–5.11)
RBC: 2.47 MIL/uL — AB (ref 3.87–5.11)
RBC: 3.1 MIL/uL — AB (ref 3.87–5.11)
RDW: 16.9 % — ABNORMAL HIGH (ref 11.5–15.5)
RDW: 16.6 % — ABNORMAL HIGH (ref 11.5–15.5)
RDW: 16.4 % — AB (ref 11.5–15.5)
RDW: 16.6 % — ABNORMAL HIGH (ref 11.5–15.5)
WBC: 19 10*3/uL — ABNORMAL HIGH (ref 4.0–10.5)
WBC: 17.6 10*3/uL — ABNORMAL HIGH (ref 4.0–10.5)
WBC: 16.5 10*3/uL — AB (ref 4.0–10.5)
WBC: 15 10*3/uL — AB (ref 4.0–10.5)

## 2017-02-03 LAB — BASIC METABOLIC PANEL
Anion gap: 9 (ref 5–15)
BUN: 114 mg/dL — ABNORMAL HIGH (ref 6–20)
CALCIUM: 8.5 mg/dL — AB (ref 8.9–10.3)
CO2: 20 mmol/L — AB (ref 22–32)
CREATININE: 2.47 mg/dL — AB (ref 0.44–1.00)
Chloride: 100 mmol/L — ABNORMAL LOW (ref 101–111)
GFR calc Af Amer: 22 mL/min — ABNORMAL LOW (ref >60)
GFR calc non Af Amer: 19 mL/min — ABNORMAL LOW (ref >60)
GLUCOSE: 187 mg/dL — AB (ref 65–99)
Potassium: 4.6 mmol/L (ref 3.5–5.1)
Sodium: 129 mmol/L — ABNORMAL LOW (ref 135–145)

## 2017-02-03 LAB — GLUCOSE, CAPILLARY
Glucose-Capillary: 166 mg/dL — ABNORMAL HIGH (ref 65–99)
Glucose-Capillary: 238 mg/dL — ABNORMAL HIGH (ref 65–99)
Glucose-Capillary: 182 mg/dL — ABNORMAL HIGH (ref 65–99)
Glucose-Capillary: 199 mg/dL — ABNORMAL HIGH (ref 65–99)

## 2017-02-03 LAB — HEMOGLOBIN AND HEMATOCRIT, BLOOD
HEMATOCRIT: 27.7 % — AB (ref 36.0–46.0)
Hemoglobin: 8.7 g/dL — ABNORMAL LOW (ref 12.0–15.0)

## 2017-02-03 LAB — PREPARE RBC (CROSSMATCH)

## 2017-02-03 MED ORDER — PANTOPRAZOLE SODIUM 40 MG IV SOLR
40.0000 mg | Freq: Two times a day (BID) | INTRAVENOUS | Status: DC
  Administered 2017-02-03: 40 mg via INTRAVENOUS
  Filled 2017-02-03: qty 40

## 2017-02-03 MED ORDER — BOOST / RESOURCE BREEZE PO LIQD
1.0000 | Freq: Two times a day (BID) | ORAL | Status: DC
Start: 2017-02-04 — End: 2017-02-06
  Administered 2017-02-04 – 2017-02-06 (×4): 1 via ORAL

## 2017-02-03 MED ORDER — ADULT MULTIVITAMIN W/MINERALS CH
1.0000 | ORAL_TABLET | Freq: Every day | ORAL | Status: DC
  Administered 2017-02-03 – 2017-02-06 (×4): 1 via ORAL
  Filled 2017-02-03 (×4): qty 1

## 2017-02-03 MED ORDER — SODIUM CHLORIDE 0.9 % IV SOLN
Freq: Once | INTRAVENOUS | Status: AC
  Administered 2017-02-03: 13:00:00 via INTRAVENOUS

## 2017-02-03 MED ORDER — PANTOPRAZOLE SODIUM 40 MG IV SOLR
40.0000 mg | Freq: Two times a day (BID) | INTRAVENOUS | Status: DC
  Administered 2017-02-04 – 2017-02-05 (×3): 40 mg via INTRAVENOUS
  Filled 2017-02-03 (×3): qty 40

## 2017-02-03 MED ORDER — SODIUM CHLORIDE 0.9 % IV SOLN
INTRAVENOUS | Status: AC
  Administered 2017-02-03 – 2017-02-04 (×2): via INTRAVENOUS

## 2017-02-03 MED ORDER — PRO-STAT SUGAR FREE PO LIQD
30.0000 mL | Freq: Every day | ORAL | Status: DC
  Administered 2017-02-03 – 2017-02-06 (×4): 30 mL via ORAL
  Filled 2017-02-03 (×4): qty 30

## 2017-02-03 MED ORDER — SODIUM CHLORIDE 0.9 % IV SOLN
Freq: Once | INTRAVENOUS | Status: AC
  Administered 2017-02-03: 02:00:00 via INTRAVENOUS

## 2017-02-03 NOTE — Progress Notes (Signed)
Nutrition Follow-up  DOCUMENTATION CODES:   Obesity unspecified  INTERVENTION:   -Increase 30 ml Prostat to daily, each supplement provides 100 kcals and 15 grams protein -Boost Breeze po BID, each supplement provides 250 kcal and 9 grams of protein -Continue MVI daily  NUTRITION DIAGNOSIS:   Increased nutrient needs related to acute illness as evidenced by estimated needs.  Ongoing  GOAL:   Patient will meet greater than or equal to 90% of their needs  Unmet  MONITOR:   PO intake, Labs, Skin  REASON FOR ASSESSMENT:   Consult Enteral/tube feeding initiation and management  ASSESSMENT:   66 yo female with PMH of CHF, defibrillator, DM, HTN, gout, asthma, hypothyroidism who was admitted to [facility] on 6/25 with abdominal pain. S/P exploratory laparotomy on 6/27 with resection of distal transverse colon, splenic flexure, descending colon, and sigmoid colon with transverse colostomy.  Pt downgraded to clear liquid diet on 02/02/17, secondary to coffee ground emesis and decreased Hgb.   Per GI notes, plan to hold off on intervention at this time, however, will consider endoscopy if Hgb continues to drop or emesis recurs.   Pt with poor oral intake. Meal completion 10%.   Labs reviewed: Na: 129 (on IV supplementation), CBGS: 295-621.   Diet Order:  Diet clear liquid Room service appropriate? Yes; Fluid consistency: Thin  Skin:  Wound (see comment) (stage I to L nare & R ear; midline abd incision)  Last BM:  02/03/17  Height:   Ht Readings from Last 1 Encounters:  01/26/17 5\' 5"  (1.651 m)    Weight:   Wt Readings from Last 1 Encounters:  02/03/17 211 lb 9.6 oz (96 kg)    Ideal Body Weight:  56.8 kg  BMI:  Body mass index is 35.21 kg/m.  Estimated Nutritional Needs:   Kcal:  3086-5784  Protein:  90-110 gm  Fluid:  1.8-2 L  EDUCATION NEEDS:   No education needs identified at this time  Esmirna Ravan A. Jimmye Norman, RD, LDN, CDE Pager:  [phone] After hours Pager: [phone]

## 2017-02-03 NOTE — Progress Notes (Signed)
I met with pt and her daughter at bedside. I will follow up on Monday to assist with planning admit to inpt rehab when pt medically ready or to d/c directly home with daughter if pt functionally improved. [phone]

## 2017-02-03 NOTE — Progress Notes (Signed)
[facility] Progress Note     Subjective: CC:  Resting comfortably. Denies chest or abdominal pain. States last episode of dark emesis was yesterday afternoon. Having colostomy output.  Sinus tachycardia (102-110) BP: 121/51 @ 0600 Objective: Vital signs in last 24 hours: Temp:  [98.5 F (36.9 C)-99.8 F (37.7 C)] 98.5 F (36.9 C) (07/13 0602) Pulse Rate:  [98-106] 102 (07/13 0602) Resp:  [18-20] 20 (07/13 0602) BP: (110-131)/(42-64) 121/51 (07/13 0602) SpO2:  [91 %-100 %] 93 % (07/13 0605) Weight:  [96 kg (211 lb 9.6 oz)] 96 kg (211 lb 9.6 oz) (07/13 0316) Last BM Date: 02/02/17  Intake/Output from previous day: 07/12 0701 - 07/13 0700 In: 1734 [P.O.:120; I.V.:1179; Blood:335; IV Piggyback:100] Out: 1380 [Urine:900; Emesis/NG output:480] Intake/Output this shift: No intake/output data recorded.  PE: Gen:  somnolent, NAD, pleasant Card:  Sinus tachycardia, pedal pulses 2+ BL Pulm:  Normal effort, clear to auscultation bilaterally Abd: Soft, non-tender, non-distended, bowel sounds present in all 4 quadrants, midline incision with widely spaced staples and wet-to-dry gauze in between. Beefy red granulation tissue visible - no slough or necrotic tissue. Mild erythema around staples. No active drainage. Dark brown stool in colostomy pouch. Skin: warm and dry, no rashes  Psych: A&Ox3   Lab Results:   Recent Labs  02/03/17 0042 02/03/17 0630  WBC 19.0* 17.6*  HGB 6.7* 7.4*  HCT 21.2* 23.6*  PLT 460* 458*   BMET  Recent Labs  02/02/17 0525 02/03/17 0630  NA 129* 129*  K 4.9 4.6  CL 97* 100*  CO2 21* 20*  GLUCOSE 350* 187*  BUN 112* 114*  CREATININE 1.81* 2.47*  CALCIUM 8.7* 8.5*   PT/INR No results for input(s): LABPROT, INR in the last 72 hours. CMP     Component Value Date/Time   NA 129 (L) 02/03/2017 0630   K 4.6 02/03/2017 0630   CL 100 (L) 02/03/2017 0630   CO2 20 (L) 02/03/2017 0630   GLUCOSE 187 (H) 02/03/2017 0630   BUN 114 (H)  02/03/2017 0630   CREATININE 2.47 (H) 02/03/2017 0630   CALCIUM 8.5 (L) 02/03/2017 0630   PROT 7.9 02/01/2017 0426   ALBUMIN 3.7 02/01/2017 0426   AST 20 02/01/2017 0426   ALT 17 02/01/2017 0426   ALKPHOS 131 (H) 02/01/2017 0426   BILITOT 0.9 02/01/2017 0426   GFRNONAA 19 (L) 02/03/2017 0630   GFRAA 22 (L) 02/03/2017 0630   Lipase     Component Value Date/Time   LIPASE 37 01/26/2017 1400       Studies/Results: No results found.  Anti-infectives: Anti-infectives    Start     Dose/Rate Route Frequency Ordered Stop   01/27/17 1000  metroNIDAZOLE (FLAGYL) IVPB 500 mg  Status:  Discontinued     500 mg 100 mL/hr over 60 Minutes Intravenous Every 8 hours 01/27/17 0925 01/28/17 1229   01/26/17 1600  vancomycin (VANCOCIN) IVPB 1000 mg/200 mL premix  Status:  Discontinued     1,000 mg 200 mL/hr over 60 Minutes Intravenous Every 24 hours 01/26/17 1456 01/27/17 1229   01/26/17 1600  piperacillin-tazobactam (ZOSYN) IVPB 3.375 g  Status:  Discontinued     3.375 g 12.5 mL/hr over 240 Minutes Intravenous Every 8 hours 01/26/17 1456 01/28/17 1204   01/26/17 1430  metroNIDAZOLE (FLAGYL) IVPB 500 mg  Status:  Discontinued     500 mg 100 mL/hr over 60 Minutes Intravenous Every 8 hours 01/26/17 1421 01/26/17 1427     Assessment/Plan Colitis S/P colectomy  with an transverse colostomy, Dr. Matt Holmes, 01/18/17 [facility] - partially open midline incision -- DC STAPLES and continue wet to dry. - Ostomy: WOC applied foam dressings to protect skin, stoma OK, having bowel function - clear liquid diet 2/2 coffee ground emesis  Coffee ground emesis - GI evaluated and recommending medical mgmt with PPI and supportive care. Possible endoscopy is sxs persist. hgb 7.4 today at 0630 from 6.7 at 0020 s/p 1 unit pRBCs. Follow CBC.    Acute renal failure- Cr 1.53, improving Acute on chronic diastolic CHF Respiratory failure w/ hypoxemia in post-op setting- improved Anemia DM   LOS: 8 days     Crystal [name] , [facility] 02/03/2017, 7:49 AM Pager: [phone] Consults: [phone] Mon-Fri 7:00 am-4:30 pm Sat-Sun 7:00 am-11:30 am

## 2017-02-03 NOTE — Progress Notes (Signed)
Subjective: No further dark emesis since I saw patient yesterday.  Objective: Vital signs in last 24 hours: Temp:  [98.5 F (36.9 C)-99.8 F (37.7 C)] 98.5 F (36.9 C) (07/13 0602) Pulse Rate:  [98-106] 101 (07/13 0903) Resp:  [18-20] 20 (07/13 0602) BP: (110-153)/(42-64) 153/58 (07/13 0903) SpO2:  [91 %-100 %] 93 % (07/13 0605) Weight:  [96 kg (211 lb 9.6 oz)] 96 kg (211 lb 9.6 oz) (07/13 0316) Weight change: -1.905 kg (-4 lb 3.2 oz) Last BM Date: 02/02/17  PE: GEN:  Overweight, deconditioned, NAD ABD:  Protuberant, soft, non-tender, colostomy  Lab Results: CBC    Component Value Date/Time   WBC 17.6 (H) 02/03/2017 0630   RBC 2.76 (L) 02/03/2017 0630   HGB 7.4 (L) 02/03/2017 0630   HCT 23.6 (L) 02/03/2017 0630   PLT 458 (H) 02/03/2017 0630   MCV 85.5 02/03/2017 0630   MCH 26.8 02/03/2017 0630   MCHC 31.4 02/03/2017 0630   RDW 16.4 (H) 02/03/2017 0630   LYMPHSABS 2.0 02/01/2017 0426   MONOABS 1.2 (H) 02/01/2017 0426   EOSABS 0.2 02/01/2017 0426   BASOSABS 0.1 02/01/2017 0426   CMP     Component Value Date/Time   NA 129 (L) 02/03/2017 0630   K 4.6 02/03/2017 0630   CL 100 (L) 02/03/2017 0630   CO2 20 (L) 02/03/2017 0630   GLUCOSE 187 (H) 02/03/2017 0630   BUN 114 (H) 02/03/2017 0630   CREATININE 2.47 (H) 02/03/2017 0630   CALCIUM 8.5 (L) 02/03/2017 0630   PROT 7.9 02/01/2017 0426   ALBUMIN 3.7 02/01/2017 0426   AST 20 02/01/2017 0426   ALT 17 02/01/2017 0426   ALKPHOS 131 (H) 02/01/2017 0426   BILITOT 0.9 02/01/2017 0426   GFRNONAA 19 (L) 02/03/2017 0630   GFRAA 22 (L) 02/03/2017 0630    Assessment:  1.  Partial colectomy with colostomy placement for what patient describes as "bowel blockage" with what sounds like ischemia and strangulation at outside facility. 2.  Dark emesis yesterday, none in about 24 hours.  No evidence of over fresh GI tract bleeding and colostomy bag has dark green liquid output. 3.  Anemia, likely multifactorial, not convinced  this is principally from acute GI tract process. 4.  Multiple medical problems.  Plan:  1.  Medical management with PPI and antiemetics. 2.  If continued drop in Hgb and/or recurrent dark emesis, might have to reconsider Endoscopy. 3.  Soft diet. 4.  [facility] will follow.   Landry Dyke 02/03/2017, 10:22 AM   Pager [phone] If no answer or after 5 PM call [phone]

## 2017-02-03 NOTE — Progress Notes (Signed)
Inpatient Diabetes Program Recommendations  AACE/ADA: New Consensus Statement on Inpatient Glycemic Control (2015)  Target Ranges:  Prepandial:   less than 140 mg/dL      Peak postprandial:   less than 180 mg/dL (1-2 hours)      Critically ill patients:  140 - 180 mg/dL   Results for Crystal [name], Crystal [name] (MRN [id number]) as of 02/03/2017 09:59  Ref. Range 02/02/2017 07:46 02/02/2017 11:43 02/02/2017 16:51 02/02/2017 21:15 02/03/2017 07:53  Glucose-Capillary Latest Ref Range: 65 - 99 mg/dL 307 (H) 270 (H) 221 (H) 217 (H) 166 (H)   Review of Glycemic Control  Current orders for Inpatient glycemic control: Lantus 50 units BID, Novolog 4 units TID with meals for meal coverage, Novolog 0-15 units TID with meals, Novolog 0-5 units QHS  Inpatient Diabetes Program Recommendations: Insulin - Meal Coverage: Please consider increasing meal coverage to Novolog 8 units TID with meals.  Thanks, Barnie Alderman, RN, MSN, CDE Diabetes Coordinator Inpatient Diabetes Program [phone] (Team Pager from 8am to 5pm)

## 2017-02-03 NOTE — Progress Notes (Signed)
PROGRESS NOTE   Crystal [name]  EZM:[id number]    DOB: [birth year]    DOA: 01/26/2017  PCP: Teofilo Pod., PA-C   I have briefly reviewed patients previous medical records in [facility].  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 Extubated 01/29/17 and transferred to [facility] service on 01/31/17. CCS following. An episode of "Black" emesis on 7/12 morning.Acute blood loss anemia, transfusing PRBC.   Assessment & Plan:   Active Problems:   Nonischemic cardiomyopathy (HCC)   Chronic renal insufficiency   Essential hypertension   Anemia, iron deficiency   Diabetes mellitus, type 2 (HCC)   Chronic systolic heart failure (HCC)   HLD (hyperlipidemia)   Disease of thyroid gland   Colitis   Pressure injury of skin   S/P colostomy ([place])   1. Fulminant colitis, s/p partial colectomy with transverse colostomy, Dr. Matt Holmes 02/17/17 at [facility]: Gen. surgery following. Open midline incision for which she is undergoing wet-to-dry dressing changes. [place] team assisting with ostomy management. Had an episode of large "black" emesis on 7/12. Diet downgraded to clear liquids. Please see below. 2. Possible acute UGIB: Patient had an episode of large black emesis on 7/12. Changed  diet to clears. ? Stress ulcers versus gastritis. No further episodes of emesis. However colostomy showing coffee-ground liquids. Hemoglobin had dropped to 6.7 overnight. Initially placed on IV Pepcid but will change to IV Protonix (low and rare risk of torsade) with close monitoring of EKG daily. [facility] input appreciated and recommend medical management with PPI and if continued drop in hemoglobin and or recurrent dark emesis, then might have to reconsider endoscopy. GI will follow. 3. Acute respiratory failure with hypoxia/VDRF: Extubated 01/29/17. Incentive spirometry. Chest x-ray 7/11 personally reviewed in no acute findings seen. Currently on oxygen 2.5 L and saturating in the low 90s. 4. Septic shock: Due to fulminant colitis/peritonitis. Status post surgery as above. Resolved. Antibiotics completed. Blood cultures negative. 5. Chronic systolic CHF/NICM/chronic LBBB/BiV AICD: As per outpatient Cardiology follow-up notes Dr. Harl Bowie, previously severe LV systolic dysfunction that normalized with medical treatment and AICD. BiV no longer active as reached ERI, was not replaced due to normalized LV function. Echo 04/10/16: LVEF 45-50 percent and grade 1 diastolic dysfunction. ARB held due to acute on chronic kidney disease. Continue carvedilol and rosuvastatin. Reviewed EKG 7/11 with a cardiologist and corrected QTC is 420 ms. Clinically does not appear overtly volume overloaded. Also holding Lasix due to worsening renal insufficiency. 6. Acute on stage III chronic kidney disease: Patient presented with creatinine of 2.17 which had improved gradually to 1.53. Baseline creatinine not known. Creatinine worsened to 2.47 on 7/13. This may be due to hemodynamics from acute blood loss and urinary retention. Treat underlying cause, brief and gentle IV fluids and follow BMP closely. If continues to worsen, we'll consult nephrology. Continue to hold ARB and  diuretics. 7. Acute urinary retention: Bladder scan on 7/13  showed 800+ mL urine. Patient able to sit on bedside commode and voided 900 ML. Monitor bladder scan every 8 hourly for ongoing urinary retention in which case Foley catheter may have to be placed. Otherwise encourage mobilization and spontaneous voiding. 8. Type II DM: Uncontrolled on reduced than home dose of Lantus. Adjusted Lantus and Novolog. Better. 9. Acute blood loss anemia complicating Iron deficiency anemia: Hemoglobin was stable in the 9 g range but dropped to 7.9 on 7/12. Suspected acute upper GI bleed. Hemoglobin dropped to 6.7 overnight 7/12. Transfuse 1 unit PRBC. Hemoglobin 7.2 this morning. Transfuse additional unit of PRBCs and follow hemoglobin closely. 10. COPD: Stable without clinical bronchospasm. 11. Hyperlipidemia: Continue rosuvastatin 12. Essential hypertension: Controlled. Continue carvedilol and monitor. 13. Reactive thrombocytosis: Periodically follow CBCs. Stable. 14. OSA: Nightly CPAP. Patient now using home CPAP. 15. Hyponatremia: Likely due to hyperglycemia. 16. Hypothyroid: Continue Synthroid.   DVT prophylaxis: Discontinued subcutaneous heparin due to GI bleed. SCDs. Code Status: Full Family Communication: Discussed in detail with patient's daughter at bedside. Disposition: Initially admitted to ICU. Transferred to telemetry on 7/11. DC? CIR when medically improved and stable.   Consultants:  CCM-signed off General surgery  [facility] M.D.  Procedures:  Extubated 01/29/2017 Left upper extremity PICC line.  Antimicrobials:  Completed    Subjective: No further episodes of emesis. Tolerating liquid diet. Dark drainage through colostomy. No chest pain or dyspnea reported.  ROS: Denies dyspnea, chest pain, dizziness or lightheadedness.  Objective:  Vitals:   02/03/17 0316 02/03/17 0602 02/03/17 0605 02/03/17 0903  BP: 131/64 (!) 121/51  (!) 153/58  Pulse: 100 (!) 102  (!) 101  Resp: 20 20    Temp: 99.8 F (37.7 C) 98.5 F (36.9 C)     TempSrc: Oral     SpO2: 100% 92% 93%   Weight: 96 kg (211 lb 9.6 oz)     Height:        Examination:  General exam: Pleasant middle-aged female, moderately built and obese Sitting up comfortably in bed eating breakfast this morning. Respiratory system: Clear to auscultation without increased work of breathing. Cardiovascular system: S1 and S2 heard, RRR. No JVD, murmurs or pedal edema. Telemetry personally reviewed and shows SR in the 90s-ST in the 100s. Gastrointestinal system: Surgical dressing clean and dry. Colostomy draining minimal amount of dark/coffee-ground liquids, small amount. Not distended. Soft. Mild appropriate postop tenderness. No organomegaly or masses felt. Normal bowel sounds heard.  Central nervous system: Alert and oriented. No focal neurological deficits. Stable. Extremities: Symmetric 5 x 5 power. Skin: No rashes, lesions or ulcers Psychiatry: Judgement and insight appear normal. Mood & affect appropriate.     Data Reviewed: I have personally reviewed following labs and imaging studies  CBC:  Recent Labs Lab 01/29/17 0312 01/30/17 0335  02/01/17 0426 02/02/17 1032 02/02/17 1837 02/03/17 0042 02/03/17 0630 02/03/17 1119  WBC 16.7* 13.9*  < > 12.3* 19.2* 20.9* 19.0* 17.6* 16.5*  NEUTROABS 14.5* 10.8*  --  8.8*  --   --   --   --   --   HGB 9.0* 8.9*  < > 9.8* 7.9* 7.2* 6.7* 7.4* 7.2*  HCT 29.1* 28.5*  < > 31.0* 24.3* 22.9* 21.2* 23.6* 22.4*  MCV 88.2 87.4  < > 86.4 86.2 85.8 85.8 85.5 86.2  PLT 522* 538*  < > 571* 544* 584* 460* 458* 422*  < > = values in this interval not  displayed. Basic Metabolic Panel:  Recent Labs Lab 01/28/17 1616 01/29/17 0312 01/29/17 1603 01/30/17 0335 01/31/17 0337 02/01/17 0426 02/02/17 0525 02/03/17 0630  NA  --  138 141 143 135 135 129* 129*  K  --  5.2* 4.5 4.2 4.2 4.4 4.9 4.6  CL  --  96* 98* 99* 95* 99* 97* 100*  CO2  --  24 27 28 28 26  21* 20*  GLUCOSE  --  408* 162* 135* 130* 189* 350* 187*  BUN  --   111* 121* 128* 103* 67* 112* 114*  CREATININE  --  2.97* 2.96* 2.73* 1.96* 1.53* 1.81* 2.47*  CALCIUM  --  9.3 9.6 9.4 9.2 9.2 8.7* 8.5*  MG 2.2 2.4 2.5*  --  2.7* 2.4  --   --   PHOS 5.4* 5.9* 4.7*  --  4.5 3.4  --   --    Liver Function Tests:  Recent Labs Lab 02/01/17 0426  AST 20  ALT 17  ALKPHOS 131*  BILITOT 0.9  PROT 7.9  ALBUMIN 3.7   CBG:  Recent Labs Lab 02/02/17 1143 02/02/17 1651 02/02/17 2115 02/03/17 0753 02/03/17 1142  GLUCAP 270* 221* 217* 166* 238*    Recent Results (from the past 240 hour(s))  MRSA PCR Screening     Status: None   Collection Time: 01/26/17  2:17 PM  Result Value Ref Range Status   MRSA by PCR NEGATIVE NEGATIVE Final    Comment:        The GeneXpert MRSA Assay (FDA approved for NASAL specimens only), is one component of a comprehensive MRSA colonization surveillance program. It is not intended to diagnose MRSA infection nor to guide or monitor treatment for MRSA infections.   Culture, blood (routine x 2)     Status: None   Collection Time: 01/26/17  4:05 PM  Result Value Ref Range Status   Specimen Description BLOOD RIGHT ANTECUBITAL  Final   Special Requests   Final    BOTTLES DRAWN AEROBIC AND ANAEROBIC Blood Culture adequate volume   Culture NO GROWTH 5 DAYS  Final   Report Status 01/31/2017 FINAL  Final  Culture, blood (routine x 2)     Status: None   Collection Time: 01/26/17  4:15 PM  Result Value Ref Range Status   Specimen Description BLOOD RIGHT ANTECUBITAL  Final   Special Requests   Final    BOTTLES DRAWN AEROBIC AND ANAEROBIC Blood Culture adequate volume   Culture NO GROWTH 5 DAYS  Final   Report Status 01/31/2017 FINAL  Final  Urine culture     Status: Abnormal   Collection Time: 01/26/17  5:39 PM  Result Value Ref Range Status   Specimen Description URINE, CLEAN CATCH  Final   Special Requests NONE  Final   Culture >=100,000 COLONIES/mL YEAST (A)  Final   Report Status 01/27/2017 FINAL  Final    Culture, respiratory (tracheal aspirate)     Status: None   Collection Time: 01/27/17  9:29 AM  Result Value Ref Range Status   Specimen Description TRACHEAL ASPIRATE  Final   Special Requests NONE  Final   Gram Stain   Final    MODERATE WBC PRESENT, PREDOMINANTLY PMN NO SQUAMOUS EPITHELIAL CELLS SEEN RARE BUDDING YEAST SEEN    Culture FEW CANDIDA ALBICANS  Final   Report Status 01/29/2017 FINAL  Final         Radiology Studies: No results found.      Scheduled Meds: . carvedilol  6.25 mg Oral BID WC  . febuxostat  40 mg Oral Daily  . gabapentin  100 mg Oral q morning - 10a  . gabapentin  400 mg Oral QHS  . insulin aspart  0-15 Units Subcutaneous TID WC  . insulin aspart  0-5 Units Subcutaneous QHS  . insulin aspart  4 Units Subcutaneous TID WC  . insulin glargine  50 Units Subcutaneous BID  . levothyroxine  88 mcg Oral QAC breakfast  . loratadine  10 mg Oral Daily  . montelukast  10 mg Oral QHS  . omega-3 acid ethyl esters  4 g Oral Daily  . rosuvastatin  10 mg Oral QHS  . sodium chloride flush  10-40 mL Intracatheter Q12H   Continuous Infusions: . sodium chloride    . sodium chloride 75 mL/hr at 02/03/17 0909  . sodium chloride    . famotidine (PEPCID) IV Stopped (02/03/17 0945)     LOS: 8 days     Crystal Davoli, MD, FACP, FHM. [facility] Pager [phone]  If 7PM-7AM, please contact night-coverage [email or web address] Password [email or web address] 02/03/2017, 11:55 AM

## 2017-02-03 NOTE — Progress Notes (Signed)
Physical Therapy Treatment Patient Details Name: Crystal [name] MRN: [id number] DOB: 09/16/1950 Today's Date: 02/03/2017    History of Present Illness 66 year old female with PMH as below, which is significant for CHF with defibrillator in place, CKD, DM, OSA on CPAP, COPD, and Hypothyroidism. She was admitted to [facility] with severe RLQ pain on 6/25. Upon admission CT showed distended colon. She was presumed to have colitis and was admitted to the hospitalists. The following day she became more lethargic and hypotensive. She was moved to CCU and general surgery became involved. 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 Colostomy placed and abdomen left open. Post-operatively she was shocky despite 4-5L of IVF. She was started on pressors. Renal function worsened and she was diuresed with lasix drip putting out about 12-30L. At that point she was felt to be improving. Pressors able to come off. She was tolerating WUA. Family requested transfer to [facility] 7/5 when it was felt that the patient was not progressing as expected.     PT Comments    Pt still needing encouragement, but participates.  Emphasis on bed mobility to EOB, standing with concentration on knee control in extension, pre gait and transfers.    Follow Up Recommendations  CIR     Equipment Recommendations  Rolling walker with 5" wheels    Recommendations for Other Services Rehab consult     Precautions / Restrictions Precautions Precautions: Fall Restrictions Weight Bearing Restrictions: No    Mobility  Bed Mobility Overal bed mobility: Needs Assistance Bed Mobility: Supine to Sit     Supine to sit: Mod assist;Max assist     General bed mobility comments: bridging with max assist otherwise mod to come up and forward via R elbow..  Cues for hand placement and sequencing  Transfers Overall transfer  level: Needs assistance   Transfers: Sit to/from Stand;Stand Pivot Transfers Sit to Stand: Max assist;+2 physical assistance;Mod assist Stand pivot transfers: Mod assist;Max assist;+2 physical assistance       General transfer comment: using RW pt transfered to the chair, with stability assist during pivot and help maneuvering the RW  Ambulation/Gait                 Stairs            Wheelchair Mobility    Modified Rankin (Stroke Patients Only)       Balance Overall balance assessment: Needs assistance Sitting-balance support: Feet supported Sitting balance-Leahy Scale: Fair       Standing balance-Leahy Scale: Poor Standing balance comment: pt sit to stand x3 into RW twice from bed height, once from stedy height.  pt attained full upright posture and shifted feet aroun                            Cognition Arousal/Alertness: Awake/alert Behavior During Therapy: WFL for tasks assessed/performed Overall Cognitive Status: Within Functional Limits for tasks assessed                           Safety/Judgement: Decreased awareness of deficits Awareness: Emergent Problem Solving: Slow processing        Exercises Other Exercises Other Exercises: warm up exercise to bil hip/knees with gross extension graded resistance.    General Comments        Pertinent  Vitals/Pain      Home Living                      Prior Function            PT Goals (current goals can now be found in the care plan section) Acute Rehab PT Goals PT Goal Formulation: With patient/family Time For Goal Achievement: 02/15/17 Potential to Achieve Goals: Good Progress towards PT goals: Progressing toward goals    Frequency    Min 3X/week      PT Plan Current plan remains appropriate    Co-evaluation              AM-PAC PT "6 Clicks" Daily Activity  Outcome Measure  Difficulty turning over in bed (including adjusting bedclothes,  sheets and blankets)?: Total Difficulty moving from lying on back to sitting on the side of the bed? : Total Difficulty sitting down on and standing up from a chair with arms (e.g., wheelchair, bedside commode, etc,.)?: Total Help needed moving to and from a bed to chair (including a wheelchair)?: A Lot Help needed walking in hospital room?: A Lot Help needed climbing 3-5 steps with a railing? : Total 6 Click Score: 8    End of Session   Activity Tolerance: Patient tolerated treatment well Patient left: in chair;with call bell/phone within reach;with chair alarm set Nurse Communication: Mobility status PT Visit Diagnosis: Unsteadiness on feet (R26.81);Other abnormalities of gait and mobility (R26.89)     Time: 8675-4492 PT Time Calculation (min) (ACUTE ONLY): 24 min  Charges:  $Therapeutic Activity: 23-37 mins                    G Codes:       02/10/17  Donnella Sham, PT [phone]  (pager)   [name] 2017-02-10, 5:03 PM

## 2017-02-04 ENCOUNTER — Other Ambulatory Visit: Payer: Self-pay

## 2017-02-04 DIAGNOSIS — N3000 Acute cystitis without hematuria: Secondary | ICD-10-CM

## 2017-02-04 LAB — BASIC METABOLIC PANEL
ANION GAP: 9 (ref 5–15)
BUN: 86 mg/dL — ABNORMAL HIGH (ref 6–20)
CALCIUM: 8.8 mg/dL — AB (ref 8.9–10.3)
CHLORIDE: 106 mmol/L (ref 101–111)
CO2: 20 mmol/L — AB (ref 22–32)
Creatinine, Ser: 2.02 mg/dL — ABNORMAL HIGH (ref 0.44–1.00)
GFR calc Af Amer: 28 mL/min — ABNORMAL LOW (ref >60)
GFR calc non Af Amer: 24 mL/min — ABNORMAL LOW (ref >60)
GLUCOSE: 132 mg/dL — AB (ref 65–99)
Potassium: 4.5 mmol/L (ref 3.5–5.1)
Sodium: 135 mmol/L (ref 135–145)

## 2017-02-04 LAB — URINALYSIS, ROUTINE W REFLEX MICROSCOPIC
BILIRUBIN URINE: NEGATIVE
GLUCOSE, UA: NEGATIVE mg/dL
KETONES UR: NEGATIVE mg/dL
NITRITE: NEGATIVE
PH: 5 (ref 5.0–8.0)
PROTEIN: 100 mg/dL — AB
Specific Gravity, Urine: 1.011 (ref 1.005–1.030)
Squamous Epithelial / LPF: NONE SEEN

## 2017-02-04 LAB — CBC
HEMATOCRIT: 26.7 % — AB (ref 36.0–46.0)
HEMOGLOBIN: 8.5 g/dL — AB (ref 12.0–15.0)
MCH: 27.8 pg (ref 26.0–34.0)
MCHC: 31.8 g/dL (ref 30.0–36.0)
MCV: 87.3 fL (ref 78.0–100.0)
Platelets: 390 10*3/uL (ref 150–400)
RBC: 3.06 MIL/uL — ABNORMAL LOW (ref 3.87–5.11)
RDW: 17 % — AB (ref 11.5–15.5)
WBC: 13.6 10*3/uL — AB (ref 4.0–10.5)

## 2017-02-04 LAB — GLUCOSE, CAPILLARY
Glucose-Capillary: 134 mg/dL — ABNORMAL HIGH (ref 65–99)
Glucose-Capillary: 241 mg/dL — ABNORMAL HIGH (ref 65–99)
Glucose-Capillary: 284 mg/dL — ABNORMAL HIGH (ref 65–99)
Glucose-Capillary: 257 mg/dL — ABNORMAL HIGH (ref 65–99)

## 2017-02-04 MED ORDER — NYSTATIN 100000 UNIT/GM EX POWD
Freq: Three times a day (TID) | CUTANEOUS | Status: DC
  Administered 2017-02-04: 22:00:00 via TOPICAL
  Administered 2017-02-04: 1 via TOPICAL
  Administered 2017-02-05 – 2017-02-06 (×5): via TOPICAL
  Filled 2017-02-04: qty 15

## 2017-02-04 MED ORDER — DEXTROSE 5 % IV SOLN
1.0000 g | INTRAVENOUS | Status: DC
  Administered 2017-02-04: 1 g via INTRAVENOUS
  Filled 2017-02-04 (×2): qty 10

## 2017-02-04 MED ORDER — SODIUM CHLORIDE 0.9 % IV SOLN
INTRAVENOUS | Status: DC
  Administered 2017-02-04: 490 mL via INTRAVENOUS
  Administered 2017-02-04: 1000 mL via INTRAVENOUS

## 2017-02-04 MED ORDER — INSULIN ASPART 100 UNIT/ML ~~LOC~~ SOLN
6.0000 [IU] | Freq: Three times a day (TID) | SUBCUTANEOUS | Status: DC
  Administered 2017-02-04 – 2017-02-06 (×6): 6 [IU] via SUBCUTANEOUS

## 2017-02-04 MED ORDER — ACETAMINOPHEN 160 MG/5ML PO SOLN: 650.0000 mg | ORAL | Status: DC | PRN

## 2017-02-04 NOTE — Progress Notes (Signed)
[facility] Progress Note     Subjective: CC: Resting comfortably. Reports some mild cramping gas pains. States last episode of dark emesis was 2 days ago. Having colostomy output, emptied last night. Denies fever, chills, night sweats overnight. Denies chest pain or SOB. Does reports mild dysuria.  Sinus tachycardia (102-104 bpm); low grade fever (100.5-100.8)  Objective: Vital signs in last 24 hours: Temp:  [99.1 F (37.3 C)-100.8 F (38.2 C)] 100.5 F (38.1 C) (07/14 0543) Pulse Rate:  [100-104] 101 (07/14 0543) Resp:  [18-20] 18 (07/14 0543) BP: (112-153)/(40-58) 132/47 (07/14 0543) SpO2:  [97 %-98 %] 98 % (07/14 0543) Weight:  [98.6 kg (217 lb 4.8 oz)] 98.6 kg (217 lb 4.8 oz) (07/14 0622) Last BM Date: 02/03/17  Intake/Output from previous day: 07/13 0701 - 07/14 0700 In: 2029.4 [P.O.:600; I.V.:1135.4; Blood:294] Out: 2800 [Urine:2650; Stool:150] Intake/Output this shift: No intake/output data recorded.  PE: Gen:  alert, NAD, pleasant HEENT: pupils equal, EOMs in tact Card:  RRR, pedal pulses 2+ BL Pulm:  Normal effort, clear to auscultation bilaterally Abd: Soft, non-tender, non-distended, bowel sounds present in all 4 quadrants, midline incision w beefy red granulation tissue - no slough or necrotic tissue. No active drainage. Pale but viable stoma, <5 cc brown liquid stool in bag, +flatus. . Skin: warm and dry, no rashes   Psych: A&Ox3  Lab Results:   Recent Labs  02/03/17 2239 02/04/17 0602  WBC 15.0* 13.6*  HGB 8.8* 8.5*  HCT 26.5* 26.7*  PLT 317 390   BMET  Recent Labs  02/03/17 0630 02/04/17 0602  NA 129* 135  K 4.6 4.5  CL 100* 106  CO2 20* 20*  GLUCOSE 187* 132*  BUN 114* 86*  CREATININE 2.47* 2.02*  CALCIUM 8.5* 8.8*   PT/INR No results for input(s): LABPROT, INR in the last 72 hours. CMP     Component Value Date/Time   NA 135 02/04/2017 0602   K 4.5 02/04/2017 0602   CL 106 02/04/2017 0602   CO2 20 (L) 02/04/2017  0602   GLUCOSE 132 (H) 02/04/2017 0602   BUN 86 (H) 02/04/2017 0602   CREATININE 2.02 (H) 02/04/2017 0602   CALCIUM 8.8 (L) 02/04/2017 0602   PROT 7.9 02/01/2017 0426   ALBUMIN 3.7 02/01/2017 0426   AST 20 02/01/2017 0426   ALT 17 02/01/2017 0426   ALKPHOS 131 (H) 02/01/2017 0426   BILITOT 0.9 02/01/2017 0426   GFRNONAA 24 (L) 02/04/2017 0602   GFRAA 28 (L) 02/04/2017 0602   Lipase     Component Value Date/Time   LIPASE 37 01/26/2017 1400       Studies/Results: No results found.  Anti-infectives: Anti-infectives    Start     Dose/Rate Route Frequency Ordered Stop   01/27/17 1000  metroNIDAZOLE (FLAGYL) IVPB 500 mg  Status:  Discontinued     500 mg 100 mL/hr over 60 Minutes Intravenous Every 8 hours 01/27/17 0925 01/28/17 1229   01/26/17 1600  vancomycin (VANCOCIN) IVPB 1000 mg/200 mL premix  Status:  Discontinued     1,000 mg 200 mL/hr over 60 Minutes Intravenous Every 24 hours 01/26/17 1456 01/27/17 1229   01/26/17 1600  piperacillin-tazobactam (ZOSYN) IVPB 3.375 g  Status:  Discontinued     3.375 g 12.5 mL/hr over 240 Minutes Intravenous Every 8 hours 01/26/17 1456 01/28/17 1204   01/26/17 1430  metroNIDAZOLE (FLAGYL) IVPB 500 mg  Status:  Discontinued     500 mg 100 mL/hr over 60 Minutes Intravenous  Every 8 hours 01/26/17 1421 01/26/17 1427     Assessment/Plan 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 Matt Holmes, 01/18/17 [facility] - wound care: daily wet-to-dry dressing changes. - Ostomy: WOC applied foam dressings to protect skin, stoma OK, having bowel function - PT/OT  Coffee ground emesis - last episode 7/12. GI evaluated and recommending medical mgmt with PPI and supportive care. Possible endoscopy is sxs persist. Advance to SOFT diet and monitor.   ABL anemia - hgb 8.5 today from 7.4 yesterday. S/p 2 unit pRBCs 7/13. Follow CBC.    Acute renal failure Acute on chronic diastolic CHF Respiratory failure w/ hypoxemia in post-op  setting- improved Anemia DM Dispo: CIR vs home    LOS: 9 days    Jill Alexanders , [facility] 02/04/2017, 7:41 AM Pager: [phone] Consults: [phone] Mon-Fri 7:00 am-4:30 pm Sat-Sun 7:00 am-11:30 am

## 2017-02-04 NOTE — Progress Notes (Signed)
Crystal [name] 11:22 AM  Subjective: Patient little confused but eating soft foods without abdominal pain nausea vomiting or signs of bleeding and her hospital computer chart was reviewed and her case discussed with my partner and she has no other complaints  Objective: Vital signs stable afebrile no acute distress little confused as above abdomen is soft nontender hemoglobin okay  Assessment: Seemingly self-limited coffee ground emesis  Plan: Please let us know if we could be of any further assistance with this hospital stay otherwise care with aspirin and nonsteroidals and blood thinners and continue pump inhibitors for now  [facility]  Pager [phone] After 5PM or if no answer call [phone]

## 2017-02-04 NOTE — Progress Notes (Signed)
Patient refuses CPAP therapy at this time.

## 2017-02-04 NOTE — Progress Notes (Signed)
At bladder scan, there were 835 ml retained. MD notified. Will continue to monitor.

## 2017-02-04 NOTE — Progress Notes (Signed)
PROGRESS NOTE   Crystal [name]  NAT:[id number]    DOB: 12-17-1950    DOA: 01/26/2017  PCP: Teofilo Pod., PA-C   I have briefly reviewed patients previous medical records in [facility].  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 Extubated 01/29/17 and transferred to [facility] service on 01/31/17. CCS following. Self-limited acute upper GI bleed. Transfuse 2 units PRBCs for acute blood loss anemia. Improving.   Assessment & Plan:   Active Problems:   Nonischemic cardiomyopathy (HCC)   Chronic renal insufficiency   Essential hypertension   Anemia, iron deficiency   Diabetes mellitus, type 2 (HCC)   Chronic systolic heart failure (HCC)   HLD (hyperlipidemia)   Disease of thyroid gland   Colitis   Pressure injury of skin   S/P colostomy ([place])   1. Fulminant colitis, s/p partial colectomy with transverse colostomy, Dr. Matt Holmes 02/17/17 at [facility]: Open midline incision for which she is undergoing wet-to-dry dressing changes. [place] team assisting with ostomy management. Had an episode of large "black" emesis on 7/12. GI bleed resolved. Advance diet to soft and monitor. Surgery following. 2. Self-limited acute UGIB: Patient had an episode of large black emesis on  7/12. Diet was downgraded to clears. Placed on IV PPI. GI was consulted and recommended conservative management. GI bleed. Advance to soft diet. Monitor.  3. Acute respiratory failure with hypoxia/VDRF: Extubated 01/29/17. Incentive spirometry. Chest x-ray 7/11 personally reviewed in no acute findings seen. Currently on oxygen 2.5 L and saturating in the low 90s. Wean off of oxygen as tolerated. 4. Septic shock: Due to fulminant colitis/peritonitis. Status post surgery as above. Resolved. Antibiotics completed. Blood cultures negative. 5. Chronic systolic CHF/NICM/chronic LBBB/BiV AICD: As per outpatient Cardiology follow-up notes Dr. Harl Bowie, previously severe LV systolic dysfunction that normalized with medical treatment and AICD. BiV no longer active as reached ERI, was not replaced due to normalized LV function. Echo 04/10/16: LVEF 45-50 percent and grade 1 diastolic dysfunction. ARB held due to acute on chronic kidney disease. Continue carvedilol and rosuvastatin. Reviewed EKG 7/11 with a cardiologist and corrected QTC is 420 ms. Clinically does not appear overtly volume overloaded. Also holding Lasix due to worsening renal insufficiency. 6. Acute on stage III chronic kidney disease: Patient presented with creatinine of 2.17 which had improved gradually to 1.53. Baseline creatinine not known. Creatinine worsened to 2.47 on 7/13. This may be due to hemodynamics from acute blood loss and urinary retention. Continue to hold ARB and diuretics. Creatinine improved to 2.02. Trend BMP. 7. Acute urinary retention: Bladder scan on 7/13 showed 800+ mL urine. Patient able to sit on bedside commode and voided 900 ML. Monitor bladder scan every 8 hourly for ongoing urinary retention in which case Foley catheter may have to be placed. Has not required a Foley catheter thus far. Voiding spontaneously. 8. Type II DM: Uncontrolled  on reduced than home dose of Lantus. Adjusted Lantus and Novolog. Better, mildly uncontrolled  and fluctuating. 9. Acute blood loss anemia complicating Iron deficiency anemia: Hemoglobin dropped to a low of 6.7 on 7/12. Status post units PRBC transfusion thus far. Hemoglobin stable in the mid 8 g range. Follow CBC in a.m.  10. COPD: Stable without clinical bronchospasm. 11. Hyperlipidemia: Continue rosuvastatin 12. Essential hypertension: Controlled. Continue carvedilol and monitor. 13. Reactive thrombocytosis: Periodically follow CBCs. Resolved. 14. OSA: Nightly CPAP. Patient now using home CPAP. 15. Hyponatremia: Likely due to hyperglycemia. Resolved. 16. Hypothyroid: Continue Synthroid. 17. Dysuria/UTI: UA positive. Start IV Rocephin pending urine culture results. Has low-grade fever and mild leukocytosis. Had Foley catheter around the time of surgery.   DVT prophylaxis: Discontinued subcutaneous heparin due to GI bleed. SCDs. Code Status: Full Family Communication: Discussed in detail with patient's daughter at bedside. Disposition: Initially admitted to ICU. Transferred to telemetry on 7/11. DC? CIR when medically improved and stable.   Consultants:  CCM-signed off General surgery  [facility] M.D.  Procedures:  Extubated 01/29/2017 Left upper extremity PICC line.  Antimicrobials:  IV Rocephin 7/14.   Subjective: No further episodes of vomiting. Tolerating liquid diet. Brown/greenish colored drainage through colostomy. Reports dysuria at the beginning of urinary stream.  ROS: Denies dyspnea, chest pain, dizziness or lightheadedness.  Objective:  Vitals:   02/03/17 1801 02/03/17 2129 02/04/17 0543 02/04/17 0622  BP: (!) 114/40 (!) 145/45 (!) 132/47   Pulse: (!) 101 (!) 104 (!) 101   Resp:  18 18   Temp:  99.5 F (37.5 C) (!) 100.5 F (38.1 C)   TempSrc:  Oral    SpO2:  97% 98%   Weight:    98.6 kg (217 lb 4.8 oz)  Height:        Examination:  General exam: Pleasant middle-aged female, moderately built and obese sitting up comfortably in bed  eating breakfast this morning. Respiratory system: Clear to auscultation. No increased work of breathing. Cardiovascular system: S1 and S2 heard, RRR. No JVD, murmurs or pedal edema. Telemetry: Sinus tachycardia in the 100s, BBB morphology. Gastrointestinal system: Surgical dressing clean and dry. Colostomy draining minimal amount of brown and bilous material, does not appear coffee ground. Not distended. Soft. Nontender. No organomegaly or masses felt. Normal bowel sounds heard.  Central nervous system: Alert and oriented. No focal neurological deficits. Stable Extremities: Symmetric 5 x 5 power. Skin: No rashes, lesions or ulcers Psychiatry: Judgement and insight appear normal. Mood & affect appropriate.     Data Reviewed: I have personally reviewed following labs and imaging studies  CBC:  Recent Labs Lab 01/29/17 0312 01/30/17 0335  02/01/17 0426  02/03/17 0042 02/03/17 0630 02/03/17 1119 02/03/17 1849 02/03/17 2239 02/04/17 0602  WBC 16.7* 13.9*  < > 12.3*  < > 19.0* 17.6* 16.5*  --  15.0* 13.6*  NEUTROABS 14.5* 10.8*  --  8.8*  --   --   --   --   --   --   --   HGB 9.0* 8.9*  < > 9.8*  < > 6.7* 7.4* 7.2* 8.7* 8.8* 8.5*  HCT 29.1* 28.5*  < > 31.0*  < > 21.2* 23.6* 22.4* 27.7* 26.5* 26.7*  MCV 88.2 87.4  < > 86.4  < > 85.8 85.5 86.2  --  85.5 87.3  PLT 522* 538*  < > 571*  < > 460* 458* 422*  --  317 390  < > = values in this  interval not displayed. Basic Metabolic Panel:  Recent Labs Lab 01/28/17 1616  01/29/17 0312 01/29/17 1603  01/31/17 0337 02/01/17 0426 02/02/17 0525 02/03/17 0630 02/04/17 0602  NA  --   < > 138 141  < > 135 135 129* 129* 135  K  --   < > 5.2* 4.5  < > 4.2 4.4 4.9 4.6 4.5  CL  --   < > 96* 98*  < > 95* 99* 97* 100* 106  CO2  --   < > 24 27  < > 28 26 21* 20* 20*  GLUCOSE  --   < > 408* 162*  < > 130* 189* 350* 187* 132*  BUN  --   < > 111* 121*  < > 103* 67* 112* 114* 86*  CREATININE  --   < > 2.97* 2.96*  < > 1.96* 1.53* 1.81* 2.47*  2.02*  CALCIUM  --   < > 9.3 9.6  < > 9.2 9.2 8.7* 8.5* 8.8*  MG 2.2  --  2.4 2.5*  --  2.7* 2.4  --   --   --   PHOS 5.4*  --  5.9* 4.7*  --  4.5 3.4  --   --   --   < > = values in this interval not displayed. Liver Function Tests:  Recent Labs Lab 02/01/17 0426  AST 20  ALT 17  ALKPHOS 131*  BILITOT 0.9  PROT 7.9  ALBUMIN 3.7   CBG:  Recent Labs Lab 02/03/17 1142 02/03/17 1718 02/03/17 2132 02/04/17 0742 02/04/17 1200  GLUCAP 238* 182* 199* 134* 241*    Recent Results (from the past 240 hour(s))  MRSA PCR Screening     Status: None   Collection Time: 01/26/17  2:17 PM  Result Value Ref Range Status   MRSA by PCR NEGATIVE NEGATIVE Final    Comment:        The GeneXpert MRSA Assay (FDA approved for NASAL specimens only), is one component of a comprehensive MRSA colonization surveillance program. It is not intended to diagnose MRSA infection nor to guide or monitor treatment for MRSA infections.   Culture, blood (routine x 2)     Status: None   Collection Time: 01/26/17  4:05 PM  Result Value Ref Range Status   Specimen Description BLOOD RIGHT ANTECUBITAL  Final   Special Requests   Final    BOTTLES DRAWN AEROBIC AND ANAEROBIC Blood Culture adequate volume   Culture NO GROWTH 5 DAYS  Final   Report Status 01/31/2017 FINAL  Final  Culture, blood (routine x 2)     Status: None   Collection Time: 01/26/17  4:15 PM  Result Value Ref Range Status   Specimen Description BLOOD RIGHT ANTECUBITAL  Final   Special Requests   Final    BOTTLES DRAWN AEROBIC AND ANAEROBIC Blood Culture adequate volume   Culture NO GROWTH 5 DAYS  Final   Report Status 01/31/2017 FINAL  Final  Urine culture     Status: Abnormal   Collection Time: 01/26/17  5:39 PM  Result Value Ref Range Status   Specimen Description URINE, CLEAN CATCH  Final   Special Requests NONE  Final   Culture >=100,000 COLONIES/mL YEAST (A)  Final   Report Status 01/27/2017 FINAL  Final  Culture,  respiratory (tracheal aspirate)     Status: None   Collection Time: 01/27/17  9:29 AM  Result Value Ref Range Status   Specimen Description TRACHEAL ASPIRATE  Final   Special Requests NONE  Final   Gram Stain   Final    MODERATE WBC PRESENT, PREDOMINANTLY PMN NO SQUAMOUS EPITHELIAL CELLS SEEN RARE BUDDING YEAST SEEN    Culture FEW CANDIDA ALBICANS  Final   Report Status 01/29/2017 FINAL  Final         Radiology Studies: No results found.      Scheduled Meds: . carvedilol  6.25 mg Oral BID WC  . febuxostat  40 mg Oral Daily  . feeding supplement  1 Container Oral BID BM  . feeding supplement (PRO-STAT SUGAR FREE 64)  30 mL Oral Daily  . gabapentin  100 mg Oral q morning - 10a  . gabapentin  400 mg Oral QHS  . insulin aspart  0-15 Units Subcutaneous TID WC  . insulin aspart  0-5 Units Subcutaneous QHS  . insulin aspart  4 Units Subcutaneous TID WC  . insulin glargine  50 Units Subcutaneous BID  . levothyroxine  88 mcg Oral QAC breakfast  . loratadine  10 mg Oral Daily  . montelukast  10 mg Oral QHS  . multivitamin with minerals  1 tablet Oral Daily  . omega-3 acid ethyl esters  4 g Oral Daily  . pantoprazole (PROTONIX) IV  40 mg Intravenous Q12H  . rosuvastatin  10 mg Oral QHS  . sodium chloride flush  10-40 mL Intracatheter Q12H   Continuous Infusions: . sodium chloride 490 mL (02/04/17 1013)     LOS: 9 days     Tenleigh Byer, MD, FACP, FHM. [facility] Pager [phone]  If 7PM-7AM, please contact night-coverage [email or web address] Password [email or web address] 02/04/2017, 1:18 PM

## 2017-02-04 NOTE — Progress Notes (Signed)
Patient encouraged to use BSC. She was able to void - 650 ml. Will continue to monitor,.

## 2017-02-05 DIAGNOSIS — B3749 Other urogenital candidiasis: Secondary | ICD-10-CM

## 2017-02-05 LAB — GLUCOSE, CAPILLARY
GLUCOSE-CAPILLARY: 116 mg/dL — AB (ref 65–99)
GLUCOSE-CAPILLARY: 161 mg/dL — AB (ref 65–99)
Glucose-Capillary: 185 mg/dL — ABNORMAL HIGH (ref 65–99)
Glucose-Capillary: 165 mg/dL — ABNORMAL HIGH (ref 65–99)

## 2017-02-05 LAB — CBC
HCT: 23 % — ABNORMAL LOW (ref 36.0–46.0)
HCT: 23.2 % — ABNORMAL LOW (ref 36.0–46.0)
HEMOGLOBIN: 7.1 g/dL — AB (ref 12.0–15.0)
Hemoglobin: 7.4 g/dL — ABNORMAL LOW (ref 12.0–15.0)
MCH: 28.2 pg (ref 26.0–34.0)
MCH: 27.1 pg (ref 26.0–34.0)
MCHC: 32.2 g/dL (ref 30.0–36.0)
MCHC: 30.6 g/dL (ref 30.0–36.0)
MCV: 87.8 fL (ref 78.0–100.0)
MCV: 88.5 fL (ref 78.0–100.0)
PLATELETS: 348 10*3/uL (ref 150–400)
Platelets: 348 K/uL (ref 150–400)
RBC: 2.62 MIL/uL — ABNORMAL LOW (ref 3.87–5.11)
RBC: 2.62 MIL/uL — AB (ref 3.87–5.11)
RDW: 17.4 % — ABNORMAL HIGH (ref 11.5–15.5)
RDW: 17.1 % — ABNORMAL HIGH (ref 11.5–15.5)
WBC: 9.3 K/uL (ref 4.0–10.5)
WBC: 10.8 10*3/uL — AB (ref 4.0–10.5)

## 2017-02-05 LAB — URINE CULTURE: Culture: 80000 — AB

## 2017-02-05 LAB — BASIC METABOLIC PANEL
Anion gap: 8 (ref 5–15)
BUN: 61 mg/dL — ABNORMAL HIGH (ref 6–20)
CO2: 19 mmol/L — AB (ref 22–32)
CREATININE: 1.79 mg/dL — AB (ref 0.44–1.00)
Calcium: 8.7 mg/dL — ABNORMAL LOW (ref 8.9–10.3)
Chloride: 108 mmol/L (ref 101–111)
GFR calc non Af Amer: 28 mL/min — ABNORMAL LOW (ref >60)
GFR, EST AFRICAN AMERICAN: 33 mL/min — AB (ref >60)
Glucose, Bld: 141 mg/dL — ABNORMAL HIGH (ref 65–99)
POTASSIUM: 4.5 mmol/L (ref 3.5–5.1)
Sodium: 135 mmol/L (ref 135–145)

## 2017-02-05 LAB — PREPARE RBC (CROSSMATCH)

## 2017-02-05 MED ORDER — SODIUM CHLORIDE 0.9 % IV SOLN
Freq: Once | INTRAVENOUS | Status: AC
  Administered 2017-02-05: 250 mL via INTRAVENOUS

## 2017-02-05 MED ORDER — PANTOPRAZOLE SODIUM 40 MG PO TBEC
40.0000 mg | DELAYED_RELEASE_TABLET | Freq: Every day | ORAL | Status: DC
Start: 2017-02-06 — End: 2017-02-06
  Administered 2017-02-06: 40 mg via ORAL
  Filled 2017-02-05: qty 1

## 2017-02-05 MED ORDER — SODIUM CHLORIDE 0.9 % IV SOLN
100.0000 mg | INTRAVENOUS | Status: DC
  Administered 2017-02-06: 100 mg via INTRAVENOUS
  Filled 2017-02-05: qty 100

## 2017-02-05 MED ORDER — SODIUM CHLORIDE 0.9 % IV SOLN
200.0000 mg | Freq: Once | INTRAVENOUS | Status: AC
  Administered 2017-02-05: 200 mg via INTRAVENOUS
  Filled 2017-02-05 (×3): qty 200

## 2017-02-05 NOTE — Progress Notes (Signed)
PROGRESS NOTE   Crystal [name]  SHF:[id number]    DOB: 02/05/1951    DOA: 01/26/2017  PCP: Teofilo Pod., PA-C   I have briefly reviewed patients previous medical records in [facility].  Brief Narrative:  66 year old female with PMH of chronic systolic CHF secondary to nonischemic cardiomyopathy, 2005 without significant CAD, chronic LBBB, BiV AICD, questionable history of angioedema on ACEI but tolerated ARB, chronic kidney disease, DM 2, OSA on nightly CPAP, COPD on nightly oxygen 2 L/m along with CPAP, hypothyroid, presented initially to [facility] with severe fulminant colitis involving the entire colon with the exception of the ascending colon, emergently taken to OR, colostomy placed and abdomen left open, course complicated by postop shock requiring vasopressors, worsening renal functions, family requested transfer to [facility] on 7/5 when it was felt that patient was not progressing as expected, admitted to ICU by CCM. Extubated 01/29/17 and transferred to [facility] service on 01/31/17. CCS following. Self-limited acute upper GI bleed. Transfused 2 units PRBCs for acute blood loss anemia. Improving.   Assessment & Plan:   Active Problems:   Nonischemic cardiomyopathy (HCC)   Chronic renal insufficiency   Essential hypertension   Anemia, iron deficiency   Diabetes mellitus, type 2 (HCC)   Chronic systolic heart failure (HCC)   HLD (hyperlipidemia)   Disease of thyroid gland   Colitis   Pressure injury of skin   S/P colostomy ([place])   1. Fulminant colitis, s/p partial colectomy with transverse colostomy, Dr. Matt Holmes 02/17/17 at [facility]: Open midline incision for which she is undergoing wet-to-dry dressing changes. [place] team assisting with ostomy management. Had an episode of large "black" emesis on 7/12. GI bleed resolved. Advanced diet to soft and tolerating. Surgery following. 2. Self-limited acute UGIB: Patient had an episode of large black emesis  on 7/12. Diet was downgraded to clears. Placed on IV PPI. GI was consulted and recommended conservative management. GI bleed clinically resolved.  3. Acute respiratory failure with hypoxia/VDRF: Extubated 01/29/17. Incentive spirometry. Chest x-ray 7/11 personally reviewed in no acute findings seen. Resolved. Saturating in the mid 90s on room air. 4. Septic shock: Due to fulminant colitis/peritonitis. Status post surgery as above. Resolved. Antibiotics completed. Blood cultures negative. 5. Chronic systolic CHF/NICM/chronic LBBB/BiV AICD: As per outpatient Cardiology follow-up notes Dr. Harl Bowie, previously severe LV systolic dysfunction that normalized with medical treatment and AICD. BiV no longer active as reached ERI, was not replaced due to normalized LV function. Echo 04/10/16: LVEF 45-50 percent and grade 1 diastolic dysfunction. ARB held due to acute on chronic kidney disease. Continue carvedilol and rosuvastatin. Reviewed EKG 7/11 with a cardiologist and corrected QTC is 420 ms. Clinically does not appear overtly volume overloaded. Also holding Lasix due to worsening renal insufficiency. 6. Acute on stage III chronic kidney disease: Patient presented with creatinine of 2.17 which had improved gradually to 1.53. Baseline creatinine not known. Creatinine worsened to 2.47 on 7/13. This may be due to hemodynamics from acute blood loss and urinary retention. Continue to hold ARB and diuretics. Creatinine improved to 1.79. Trend BMP. 7. Acute urinary retention: Bladder scan on 7/13 showed 800+ mL urine. Patient able to sit on bedside commode and voided 900 ML. Monitor bladder scan every 8 hourly for ongoing urinary retention in which case Foley catheter may have to be placed. Has not required a Foley catheter thus far. Voiding spontaneously but does not go onto she is quite distended. She states she does have sensation of  bladder filling. Advised to go on bedside commode every 6 hours. 8. Type II DM:  Uncontrolled on reduced than home dose of Lantus. Adjusted Lantus and Novolog. Better, mildly uncontrolled and fluctuating. 9. Acute blood loss anemia complicating Iron deficiency anemia: Hemoglobin dropped to a low of 6.7 on 7/12. Status post units PRBC transfusion thus far. Hemoglobin dropped from 8 g range to 7.4 in the absence of overt bleeding.? Lab error. Repeat to confirm. 10. COPD: Stable without clinical bronchospasm. 11. Hyperlipidemia: Continue rosuvastatin 12. Essential hypertension: Controlled. Continue carvedilol and monitor. 13. Reactive thrombocytosis: Periodically follow CBCs. Resolved. 14. OSA: Nightly CPAP. Patient now using home CPAP. 15. Hyponatremia: Likely due to hyperglycemia. Resolved. 16. Hypothyroid: Continue Synthroid. 17. Dysuria/Fungal UTI: UA positive. Started IV Rocephin pending urine culture results. Has low-grade fever and mild leukocytosis. Had Foley catheter around the time of surgery. Urine culture again showing only yeast. DC IV ceftriaxone. Discussed with ID MD on call and treat with IV Anidulafungin (aviding Diflucan d/t h/o long QT).   DVT prophylaxis: Discontinued subcutaneous heparin due to GI bleed. SCDs. Code Status: Full Family Communication: Discussed in detail with patient's son at bedside. Encouraged patient to mobilize and get out of bed to chair, ambulate with assistance. Disposition: Initially admitted to ICU. Transferred to telemetry on 7/11. DC? CIR when medically improved and stable.   Consultants:  CCM-signed off General surgery  [facility] M.D.  Procedures:  Extubated 01/29/2017 Left upper extremity PICC line.  Antimicrobials:  IV Rocephin 7/14-7/15.   Subjective: Tolerating soft diet. No further emesis. Brown stools in colostomy. No dyspnea or chest pain. Dysuria improved.  ROS: Denies dyspnea, chest pain, dizziness or lightheadedness.  Objective:  Vitals:   02/04/17 0622 02/04/17 1359 02/04/17 2100 02/05/17  0300  BP:  (!) 127/51 (!) 123/42 (!) 114/42  Pulse:  100 96 94  Resp:  17 17 18   Temp:  (!) 100.9 F (38.3 C) 100.3 F (37.9 C) 99.5 F (37.5 C)  TempSrc:  Oral Oral Oral  SpO2:  100% 96% 95%  Weight: 98.6 kg (217 lb 4.8 oz)   99.8 kg (220 lb)  Height:        Examination:  General exam: Pleasant middle-aged female, moderately built and obese lying comfortably propped up in bed. Respiratory system: Clear to auscultation. No increased work of breathing. Cardiovascular system: S1 and S2 heard, RRR. No JVD, murmurs or pedal edema. Telemetry: SR in 90's, BBB morphology. Gastrointestinal system: Surgical dressing clean and dry. Colostomy draining minimal amount of brown soft stool. Not distended. Soft. Nontender. No organomegaly or masses felt. Normal bowel sounds heard.  Central nervous system: Alert and oriented. No focal neurological deficits. Stable Extremities: Symmetric 5 x 5 power. Skin: No rashes, lesions or ulcers Psychiatry: Judgement and insight appear normal. Mood & affect appropriate.     Data Reviewed: I have personally reviewed following labs and imaging studies  CBC:  Recent Labs Lab 01/30/17 0335  02/01/17 0426  02/03/17 0630 02/03/17 1119 02/03/17 1849 02/03/17 2239 02/04/17 0602 02/05/17 0505  WBC 13.9*  < > 12.3*  < > 17.6* 16.5*  --  15.0* 13.6* 9.3  NEUTROABS 10.8*  --  8.8*  --   --   --   --   --   --   --   HGB 8.9*  < > 9.8*  < > 7.4* 7.2* 8.7* 8.8* 8.5* 7.4*  HCT 28.5*  < > 31.0*  < > 23.6* 22.4* 27.7* 26.5* 26.7* 23.0*  MCV 87.4  < > 86.4  < > 85.5 86.2  --  85.5 87.3 87.8  PLT 538*  < > 571*  < > 458* 422*  --  317 390 348  < > = values in this interval not displayed. Basic Metabolic Panel:  Recent Labs Lab 01/29/17 1603  01/31/17 0337 02/01/17 0426 02/02/17 0525 02/03/17 0630 02/04/17 0602 02/05/17 0505  NA 141  < > 135 135 129* 129* 135 135  K 4.5  < > 4.2 4.4 4.9 4.6 4.5 4.5  CL 98*  < > 95* 99* 97* 100* 106 108  CO2 27  < > 28  26 21* 20* 20* 19*  GLUCOSE 162*  < > 130* 189* 350* 187* 132* 141*  BUN 121*  < > 103* 67* 112* 114* 86* 61*  CREATININE 2.96*  < > 1.96* 1.53* 1.81* 2.47* 2.02* 1.79*  CALCIUM 9.6  < > 9.2 9.2 8.7* 8.5* 8.8* 8.7*  MG 2.5*  --  2.7* 2.4  --   --   --   --   PHOS 4.7*  --  4.5 3.4  --   --   --   --   < > = values in this interval not displayed. Liver Function Tests:  Recent Labs Lab 02/01/17 0426  AST 20  ALT 17  ALKPHOS 131*  BILITOT 0.9  PROT 7.9  ALBUMIN 3.7   CBG:  Recent Labs Lab 02/04/17 1200 02/04/17 1654 02/04/17 2124 02/05/17 0741 02/05/17 1225  GLUCAP 241* 284* 257* 116* 185*    Recent Results (from the past 240 hour(s))  MRSA PCR Screening     Status: None   Collection Time: 01/26/17  2:17 PM  Result Value Ref Range Status   MRSA by PCR NEGATIVE NEGATIVE Final    Comment:        The GeneXpert MRSA Assay (FDA approved for NASAL specimens only), is one component of a comprehensive MRSA colonization surveillance program. It is not intended to diagnose MRSA infection nor to guide or monitor treatment for MRSA infections.   Culture, blood (routine x 2)     Status: None   Collection Time: 01/26/17  4:05 PM  Result Value Ref Range Status   Specimen Description BLOOD RIGHT ANTECUBITAL  Final   Special Requests   Final    BOTTLES DRAWN AEROBIC AND ANAEROBIC Blood Culture adequate volume   Culture NO GROWTH 5 DAYS  Final   Report Status 01/31/2017 FINAL  Final  Culture, blood (routine x 2)     Status: None   Collection Time: 01/26/17  4:15 PM  Result Value Ref Range Status   Specimen Description BLOOD RIGHT ANTECUBITAL  Final   Special Requests   Final    BOTTLES DRAWN AEROBIC AND ANAEROBIC Blood Culture adequate volume   Culture NO GROWTH 5 DAYS  Final   Report Status 01/31/2017 FINAL  Final  Urine culture     Status: Abnormal   Collection Time: 01/26/17  5:39 PM  Result Value Ref Range Status   Specimen Description URINE, CLEAN CATCH  Final    Special Requests NONE  Final   Culture >=100,000 COLONIES/mL YEAST (A)  Final   Report Status 01/27/2017 FINAL  Final  Culture, respiratory (tracheal aspirate)     Status: None   Collection Time: 01/27/17  9:29 AM  Result Value Ref Range Status   Specimen Description TRACHEAL ASPIRATE  Final   Special Requests NONE  Final   Gram Stain  Final    MODERATE WBC PRESENT, PREDOMINANTLY PMN NO SQUAMOUS EPITHELIAL CELLS SEEN RARE BUDDING YEAST SEEN    Culture FEW CANDIDA ALBICANS  Final   Report Status 01/29/2017 FINAL  Final  Culture, Urine     Status: Abnormal   Collection Time: 02/04/17 10:06 AM  Result Value Ref Range Status   Specimen Description URINE, RANDOM  Final   Special Requests NONE  Final   Culture 80,000 COLONIES/mL YEAST (A)  Final   Report Status 02/05/2017 FINAL  Final         Radiology Studies: No results found.      Scheduled Meds: . carvedilol  6.25 mg Oral BID WC  . febuxostat  40 mg Oral Daily  . feeding supplement  1 Container Oral BID BM  . feeding supplement (PRO-STAT SUGAR FREE 64)  30 mL Oral Daily  . gabapentin  100 mg Oral q morning - 10a  . gabapentin  400 mg Oral QHS  . insulin aspart  0-15 Units Subcutaneous TID WC  . insulin aspart  0-5 Units Subcutaneous QHS  . insulin aspart  6 Units Subcutaneous TID WC  . insulin glargine  50 Units Subcutaneous BID  . levothyroxine  88 mcg Oral QAC breakfast  . loratadine  10 mg Oral Daily  . montelukast  10 mg Oral QHS  . multivitamin with minerals  1 tablet Oral Daily  . nystatin   Topical TID  . omega-3 acid ethyl esters  4 g Oral Daily  . pantoprazole (PROTONIX) IV  40 mg Intravenous Q12H  . rosuvastatin  10 mg Oral QHS  . sodium chloride flush  10-40 mL Intracatheter Q12H   Continuous Infusions: . cefTRIAXone (ROCEPHIN)  IV Stopped (02/04/17 1450)     LOS: 10 days     Idalie Canto, MD, FACP, FHM. [facility] Pager [phone]  If 7PM-7AM, please contact  night-coverage [email or web address] Password [email or web address] 02/05/2017, 2:06 PM

## 2017-02-05 NOTE — Progress Notes (Signed)
Pharmacy Antibiotic Note  Crystal [name] is a 66 y.o. 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 Pt has dysuria now, started IV rocephin yesterday, Urine culture is positive for yeast. Start anidulafungin per ID recommendation (QTc > 500, so avoid diflucan).   7/14 U Cx -  80K colonies of yeast  Plan: - Anidulafungin 200 mg IV x 1 today then 100 mg Q 24 hrs from tomorrow.   Height: 5\' 5"  (165.1 cm) Weight: 220 lb (99.8 kg) IBW/kg (Calculated) : 57  Temp (24hrs), Avg:99.9 F (37.7 C), Min:99.5 F (37.5 C), Max:100.3 F (37.9 C)   Recent Labs Lab 02/01/17 0426 02/02/17 0525  02/03/17 0630 02/03/17 1119 02/03/17 2239 02/04/17 0602 02/05/17 0505  WBC 12.3*  --   < > 17.6* 16.5* 15.0* 13.6* 9.3  CREATININE 1.53* 1.81*  --  2.47*  --   --  2.02* 1.79*  < > = values in this interval not displayed.  Estimated Creatinine Clearance: 35.7 mL/min (A) (by C-G formula based on SCr of 1.79 mg/dL (H)).    Antimicrobials this admission: 7/5 vancomycin > 7/6 7/5 zosyn > 7/7 7/6 flagyl > 7/7 7/14 Rocephin x 1 7/15 Anidulafungin   Dose adjustments this admission:   Microbiology results: 7/5 blood cx: ngtd 7/5 resp cx: few candida 7/5 urine cx: 100K yeast 7/14 U Cx -  80K colonies of yeast   Thank you for allowing pharmacy to be a part of this patient's care.  Maryanna Shape, PharmD, BCPS  Clinical Pharmacist  Pager: [phone]   02/05/2017 2:43 PM

## 2017-02-05 NOTE — Progress Notes (Signed)
Pt refusing CPAP at this time. Will set up machine for pt if she requests. RT will continue to monitor.

## 2017-02-05 NOTE — Progress Notes (Signed)
[facility] Progress Note     Subjective: CC:  Son at bedside. Patient is alert and oriented. Denies further coffee ground emesis. Denies abdominal pain. Tolerated SOFT diet without nausea or vomiting. Having colostomy output. Wants to try to mobilize today with therapies - feels weak.  Afebrile, BP at 0300 114/42 Objective: Vital signs in last 24 hours: Temp:  [99.5 F (37.5 C)-100.9 F (38.3 C)] 99.5 F (37.5 C) (07/15 0300) Pulse Rate:  [94-100] 94 (07/15 0300) Resp:  [17-18] 18 (07/15 0300) BP: (114-127)/(42-51) 114/42 (07/15 0300) SpO2:  [95 %-100 %] 95 % (07/15 0300) Weight:  [99.8 kg (220 lb)] 99.8 kg (220 lb) (07/15 0300) Last BM Date: 02/04/17  Intake/Output from previous day: 07/14 0701 - 07/15 0700 In: 877.5 [P.O.:320; I.V.:507.5; IV Piggyback:50] Out: 3125 [Urine:2925; Stool:200] Intake/Output this shift: Total I/O In: 60 [P.O.:60] Out: -   PE: Gen: alert, NAD, pleasant HEENT: pupils equal, EOMs in tact Card: RRR, pedal pulses 2+ BL Pulm: Normal effort, clear to auscultation bilaterally Abd: Soft, non-tender, non-distended, bowel sounds present in all 4 quadrants, midline incision healing appropriately. No active drainage. Pale but viable stoma, stool in bag. Skin: warm and dry, no rashes   Psych: A&Ox3   Lab Results:   Recent Labs  02/04/17 0602 02/05/17 0505  WBC 13.6* 9.3  HGB 8.5* 7.4*  HCT 26.7* 23.0*  PLT 390 348   BMET  Recent Labs  02/04/17 0602 02/05/17 0505  NA 135 135  K 4.5 4.5  CL 106 108  CO2 20* 19*  GLUCOSE 132* 141*  BUN 86* 61*  CREATININE 2.02* 1.79*  CALCIUM 8.8* 8.7*   PT/INR No results for input(s): LABPROT, INR in the last 72 hours. CMP     Component Value Date/Time   NA 135 02/05/2017 0505   K 4.5 02/05/2017 0505   CL 108 02/05/2017 0505   CO2 19 (L) 02/05/2017 0505   GLUCOSE 141 (H) 02/05/2017 0505   BUN 61 (H) 02/05/2017 0505   CREATININE 1.79 (H) 02/05/2017 0505   CALCIUM 8.7 (L)  02/05/2017 0505   PROT 7.9 02/01/2017 0426   ALBUMIN 3.7 02/01/2017 0426   AST 20 02/01/2017 0426   ALT 17 02/01/2017 0426   ALKPHOS 131 (H) 02/01/2017 0426   BILITOT 0.9 02/01/2017 0426   GFRNONAA 28 (L) 02/05/2017 0505   GFRAA 33 (L) 02/05/2017 0505   Lipase     Component Value Date/Time   LIPASE 37 01/26/2017 1400   Studies/Results: No results found.  Anti-infectives: Anti-infectives    Start     Dose/Rate Route Frequency Ordered Stop   02/04/17 1430  cefTRIAXone (ROCEPHIN) 1 g in dextrose 5 % 50 mL IVPB     1 g 100 mL/hr over 30 Minutes Intravenous Every 24 hours 02/04/17 1335     01/27/17 1000  metroNIDAZOLE (FLAGYL) IVPB 500 mg  Status:  Discontinued     500 mg 100 mL/hr over 60 Minutes Intravenous Every 8 hours 01/27/17 0925 01/28/17 1229   01/26/17 1600  vancomycin (VANCOCIN) IVPB 1000 mg/200 mL premix  Status:  Discontinued     1,000 mg 200 mL/hr over 60 Minutes Intravenous Every 24 hours 01/26/17 1456 01/27/17 1229   01/26/17 1600  piperacillin-tazobactam (ZOSYN) IVPB 3.375 g  Status:  Discontinued     3.375 g 12.5 mL/hr over 240 Minutes Intravenous Every 8 hours 01/26/17 1456 01/28/17 1204   01/26/17 1430  metroNIDAZOLE (FLAGYL) IVPB 500 mg  Status:  Discontinued  500 mg 100 mL/hr over 60 Minutes Intravenous Every 8 hours 01/26/17 1421 01/26/17 1427     Assessment/Plan 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 Matt Holmes, 01/18/17 [facility] - wound care: daily wet-to-dry dressing changes. - Ostomy: WOC applied foam dressings to protect skin, stoma OK, having bowel function - SOFT diet - PT/OT  Coffee ground emesis- last episode 7/12. GI evaluated and recommending medical mgmt with PPI and supportive care. Possible endoscopy is sxs persist.   ABL anemia - hgb 7.4 today from 8.5 yesterday. S/p 2 unit pRBCs 7/13. Follow CBC.   Acute renal failure Acute on chronic diastolic CHF Respiratory failure w/ hypoxemia in post-op  setting- improved Anemia DM Dispo: CIR vs home    LOS: 10 days    Jill Alexanders , [facility] 02/05/2017, 8:25 AM Pager: [phone] Consults: [phone] Mon-Fri 7:00 am-4:30 pm Sat-Sun 7:00 am-11:30 am

## 2017-02-06 ENCOUNTER — Inpatient Hospital Stay (HOSPITAL_COMMUNITY)
Admission: RE | Admit: 2017-02-06 | Discharge: 2017-02-15 | DRG: 948 | Disposition: A | Discharge status: Home Health | Payer: Medicare Other | Source: Intra-hospital | Attending: Physical Medicine & Rehabilitation | Admitting: Physical Medicine & Rehabilitation

## 2017-02-06 ENCOUNTER — Encounter (HOSPITAL_COMMUNITY): Payer: Self-pay | Admitting: *Deleted

## 2017-02-06 DIAGNOSIS — Z888 Allergy status to other drugs, medicaments and biological substances status: Secondary | ICD-10-CM

## 2017-02-06 DIAGNOSIS — Z90710 Acquired absence of both cervix and uterus: Secondary | ICD-10-CM | POA: Diagnosis not present

## 2017-02-06 DIAGNOSIS — Z8249 Family history of ischemic heart disease and other diseases of the circulatory system: Secondary | ICD-10-CM | POA: Diagnosis not present

## 2017-02-06 DIAGNOSIS — F4321 Adjustment disorder with depressed mood: Secondary | ICD-10-CM | POA: Diagnosis not present

## 2017-02-06 DIAGNOSIS — I5032 Chronic diastolic (congestive) heart failure: Secondary | ICD-10-CM | POA: Diagnosis present

## 2017-02-06 DIAGNOSIS — Z7982 Long term (current) use of aspirin: Secondary | ICD-10-CM | POA: Diagnosis not present

## 2017-02-06 DIAGNOSIS — M10171 Lead-induced gout, right ankle and foot: Secondary | ICD-10-CM | POA: Diagnosis not present

## 2017-02-06 DIAGNOSIS — N289 Disorder of kidney and ureter, unspecified: Secondary | ICD-10-CM | POA: Diagnosis present

## 2017-02-06 DIAGNOSIS — E039 Hypothyroidism, unspecified: Secondary | ICD-10-CM | POA: Diagnosis present

## 2017-02-06 DIAGNOSIS — N189 Chronic kidney disease, unspecified: Secondary | ICD-10-CM | POA: Diagnosis present

## 2017-02-06 DIAGNOSIS — T560X1D Toxic effect of lead and its compounds, accidental (unintentional), subsequent encounter: Secondary | ICD-10-CM | POA: Diagnosis not present

## 2017-02-06 DIAGNOSIS — E1142 Type 2 diabetes mellitus with diabetic polyneuropathy: Secondary | ICD-10-CM | POA: Diagnosis present

## 2017-02-06 DIAGNOSIS — Z95810 Presence of automatic (implantable) cardiac defibrillator: Secondary | ICD-10-CM

## 2017-02-06 DIAGNOSIS — E1122 Type 2 diabetes mellitus with diabetic chronic kidney disease: Secondary | ICD-10-CM | POA: Diagnosis present

## 2017-02-06 DIAGNOSIS — G8929 Other chronic pain: Secondary | ICD-10-CM | POA: Diagnosis not present

## 2017-02-06 DIAGNOSIS — B49 Unspecified mycosis: Secondary | ICD-10-CM | POA: Diagnosis present

## 2017-02-06 DIAGNOSIS — N390 Urinary tract infection, site not specified: Secondary | ICD-10-CM | POA: Diagnosis present

## 2017-02-06 DIAGNOSIS — Z881 Allergy status to other antibiotic agents status: Secondary | ICD-10-CM

## 2017-02-06 DIAGNOSIS — R531 Weakness: Secondary | ICD-10-CM | POA: Diagnosis not present

## 2017-02-06 DIAGNOSIS — R5381 Other malaise: Secondary | ICD-10-CM | POA: Diagnosis present

## 2017-02-06 DIAGNOSIS — E785 Hyperlipidemia, unspecified: Secondary | ICD-10-CM | POA: Diagnosis present

## 2017-02-06 DIAGNOSIS — K529 Noninfective gastroenteritis and colitis, unspecified: Secondary | ICD-10-CM | POA: Diagnosis present

## 2017-02-06 DIAGNOSIS — I428 Other cardiomyopathies: Secondary | ICD-10-CM | POA: Diagnosis present

## 2017-02-06 DIAGNOSIS — G4733 Obstructive sleep apnea (adult) (pediatric): Secondary | ICD-10-CM | POA: Diagnosis present

## 2017-02-06 DIAGNOSIS — I130 Hypertensive heart and chronic kidney disease with heart failure and stage 1 through stage 4 chronic kidney disease, or unspecified chronic kidney disease: Secondary | ICD-10-CM | POA: Diagnosis present

## 2017-02-06 DIAGNOSIS — M545 Low back pain: Secondary | ICD-10-CM | POA: Diagnosis not present

## 2017-02-06 DIAGNOSIS — D509 Iron deficiency anemia, unspecified: Secondary | ICD-10-CM | POA: Diagnosis present

## 2017-02-06 DIAGNOSIS — Z794 Long term (current) use of insulin: Secondary | ICD-10-CM | POA: Diagnosis not present

## 2017-02-06 DIAGNOSIS — Z79899 Other long term (current) drug therapy: Secondary | ICD-10-CM

## 2017-02-06 DIAGNOSIS — Z6835 Body mass index (BMI) 35.0-35.9, adult: Secondary | ICD-10-CM

## 2017-02-06 DIAGNOSIS — N183 Chronic kidney disease, stage 3 (moderate): Secondary | ICD-10-CM | POA: Diagnosis present

## 2017-02-06 DIAGNOSIS — Z933 Colostomy status: Secondary | ICD-10-CM

## 2017-02-06 DIAGNOSIS — M109 Gout, unspecified: Secondary | ICD-10-CM | POA: Diagnosis present

## 2017-02-06 DIAGNOSIS — D508 Other iron deficiency anemias: Secondary | ICD-10-CM

## 2017-02-06 LAB — BPAM RBC
BLOOD PRODUCT EXPIRATION DATE: 201808022359
Blood Product Expiration Date: 201808022359
Blood Product Expiration Date: 201808022359
ISSUE DATE / TIME: 201807131247
ISSUE DATE / TIME: 201807151643
ISSUE DATE / TIME: 201807130252
UNIT TYPE AND RH: 7300
Unit Type and Rh: 7300
Unit Type and Rh: 7300

## 2017-02-06 LAB — BASIC METABOLIC PANEL
Anion gap: 8 (ref 5–15)
BUN: 41 mg/dL — AB (ref 6–20)
CHLORIDE: 109 mmol/L (ref 101–111)
CO2: 20 mmol/L — ABNORMAL LOW (ref 22–32)
CREATININE: 1.54 mg/dL — AB (ref 0.44–1.00)
Calcium: 8.8 mg/dL — ABNORMAL LOW (ref 8.9–10.3)
GFR, EST AFRICAN AMERICAN: 39 mL/min — AB (ref >60)
GFR, EST NON AFRICAN AMERICAN: 34 mL/min — AB (ref >60)
Glucose, Bld: 103 mg/dL — ABNORMAL HIGH (ref 65–99)
POTASSIUM: 4.4 mmol/L (ref 3.5–5.1)
SODIUM: 137 mmol/L (ref 135–145)

## 2017-02-06 LAB — TYPE AND SCREEN
ABO/RH(D): B POS
ANTIBODY SCREEN: NEGATIVE
UNIT DIVISION: 0
UNIT DIVISION: 0
Unit division: 0

## 2017-02-06 LAB — CBC
HCT: 25.5 % — ABNORMAL LOW (ref 36.0–46.0)
HEMOGLOBIN: 8.1 g/dL — AB (ref 12.0–15.0)
MCH: 27.6 pg (ref 26.0–34.0)
MCHC: 31.8 g/dL (ref 30.0–36.0)
MCV: 86.7 fL (ref 78.0–100.0)
PLATELETS: 324 10*3/uL (ref 150–400)
RBC: 2.94 MIL/uL — AB (ref 3.87–5.11)
RDW: 16.9 % — ABNORMAL HIGH (ref 11.5–15.5)
WBC: 8.8 10*3/uL (ref 4.0–10.5)

## 2017-02-06 LAB — CBC WITH DIFFERENTIAL/PLATELET
BASOS ABS: 0 10*3/uL (ref 0.0–0.1)
Basophils Relative: 0 %
EOS ABS: 0.5 10*3/uL (ref 0.0–0.7)
EOS PCT: 5 %
HCT: 26.4 % — ABNORMAL LOW (ref 36.0–46.0)
HEMOGLOBIN: 8.3 g/dL — AB (ref 12.0–15.0)
Lymphocytes Relative: 20 %
Lymphs Abs: 2 10*3/uL (ref 0.7–4.0)
MCH: 27.1 pg (ref 26.0–34.0)
MCHC: 31.4 g/dL (ref 30.0–36.0)
MCV: 86.3 fL (ref 78.0–100.0)
Monocytes Absolute: 1.1 10*3/uL — ABNORMAL HIGH (ref 0.1–1.0)
Monocytes Relative: 11 %
NEUTROS PCT: 64 %
Neutro Abs: 6.5 10*3/uL (ref 1.7–7.7)
PLATELETS: 316 10*3/uL (ref 150–400)
RBC: 3.06 MIL/uL — AB (ref 3.87–5.11)
RDW: 16.8 % — ABNORMAL HIGH (ref 11.5–15.5)
WBC: 10.1 10*3/uL (ref 4.0–10.5)

## 2017-02-06 LAB — GLUCOSE, CAPILLARY
GLUCOSE-CAPILLARY: 102 mg/dL — AB (ref 65–99)
GLUCOSE-CAPILLARY: 302 mg/dL — AB (ref 65–99)
Glucose-Capillary: 258 mg/dL — ABNORMAL HIGH (ref 65–99)
Glucose-Capillary: 163 mg/dL — ABNORMAL HIGH (ref 65–99)

## 2017-02-06 MED ORDER — BOOST / RESOURCE BREEZE PO LIQD: 1.0000 | Freq: Two times a day (BID) | ORAL | Status: DC

## 2017-02-06 MED ORDER — FEBUXOSTAT 40 MG PO TABS
40.0000 mg | ORAL_TABLET | Freq: Every day | ORAL | Status: DC
  Administered 2017-02-07 – 2017-02-13 (×7): 40 mg via ORAL
  Filled 2017-02-06 (×8): qty 1

## 2017-02-06 MED ORDER — NYSTATIN 100000 UNIT/GM EX POWD: Freq: Three times a day (TID) | CUTANEOUS | Status: DC

## 2017-02-06 MED ORDER — SODIUM CHLORIDE 0.9 % IV SOLN
100.0000 mg | INTRAVENOUS | Status: AC
  Administered 2017-02-07: 100 mg via INTRAVENOUS
  Filled 2017-02-06: qty 100

## 2017-02-06 MED ORDER — BOOST / RESOURCE BREEZE PO LIQD
1.0000 | Freq: Two times a day (BID) | ORAL | Status: DC
  Administered 2017-02-07 – 2017-02-10 (×8): 1 via ORAL

## 2017-02-06 MED ORDER — NYSTATIN 100000 UNIT/GM EX POWD
Freq: Three times a day (TID) | CUTANEOUS | Status: DC
  Administered 2017-02-06 – 2017-02-15 (×26): via TOPICAL
  Filled 2017-02-06: qty 15

## 2017-02-06 MED ORDER — PANTOPRAZOLE SODIUM 40 MG PO TBEC
40.0000 mg | DELAYED_RELEASE_TABLET | Freq: Every day | ORAL | Status: DC
  Administered 2017-02-07 – 2017-02-15 (×9): 40 mg via ORAL
  Filled 2017-02-06 (×9): qty 1

## 2017-02-06 MED ORDER — INSULIN GLARGINE 300 UNIT/ML ~~LOC~~ SOPN: 50.0000 [IU] | PEN_INJECTOR | Freq: Two times a day (BID) | SUBCUTANEOUS | Status: DC

## 2017-02-06 MED ORDER — LEVOTHYROXINE SODIUM 88 MCG PO TABS
88.0000 ug | ORAL_TABLET | Freq: Every day | ORAL | Status: DC
  Administered 2017-02-07 – 2017-02-15 (×9): 88 ug via ORAL
  Filled 2017-02-06 (×9): qty 1

## 2017-02-06 MED ORDER — INSULIN GLARGINE 100 UNIT/ML ~~LOC~~ SOLN
50.0000 [IU] | Freq: Two times a day (BID) | SUBCUTANEOUS | Status: DC
  Administered 2017-02-06 – 2017-02-07 (×2): 50 [IU] via SUBCUTANEOUS
  Filled 2017-02-06 (×2): qty 0.5

## 2017-02-06 MED ORDER — PRO-STAT SUGAR FREE PO LIQD: 30.0000 mL | Freq: Every day | ORAL | Status: DC

## 2017-02-06 MED ORDER — SODIUM CHLORIDE 0.9 % IV SOLN: 100.0000 mg | INTRAVENOUS | Status: DC

## 2017-02-06 MED ORDER — CARVEDILOL 12.5 MG PO TABS: 6.2500 mg | ORAL_TABLET | Freq: Two times a day (BID) | ORAL | Status: DC

## 2017-02-06 MED ORDER — SORBITOL 70 % SOLN: 30.0000 mL | Freq: Every day | Status: DC | PRN

## 2017-02-06 MED ORDER — MONTELUKAST SODIUM 10 MG PO TABS
10.0000 mg | ORAL_TABLET | Freq: Every day | ORAL | Status: DC
  Administered 2017-02-06 – 2017-02-14 (×9): 10 mg via ORAL
  Filled 2017-02-06 (×9): qty 1

## 2017-02-06 MED ORDER — PANTOPRAZOLE SODIUM 40 MG PO TBEC: 40.0000 mg | DELAYED_RELEASE_TABLET | Freq: Every day | ORAL | Status: DC

## 2017-02-06 MED ORDER — CARVEDILOL 6.25 MG PO TABS
6.2500 mg | ORAL_TABLET | Freq: Two times a day (BID) | ORAL | Status: DC
  Administered 2017-02-06 – 2017-02-15 (×18): 6.25 mg via ORAL
  Filled 2017-02-06 (×17): qty 1

## 2017-02-06 MED ORDER — ACETAMINOPHEN 160 MG/5ML PO SOLN
650.0000 mg | ORAL | Status: DC | PRN
  Administered 2017-02-08 – 2017-02-12 (×3): 650 mg via ORAL
  Filled 2017-02-06 (×4): qty 20.3

## 2017-02-06 MED ORDER — INSULIN ASPART 100 UNIT/ML ~~LOC~~ SOLN
6.0000 [IU] | Freq: Three times a day (TID) | SUBCUTANEOUS | Status: DC
  Administered 2017-02-06 – 2017-02-15 (×24): 6 [IU] via SUBCUTANEOUS

## 2017-02-06 MED ORDER — ADULT MULTIVITAMIN W/MINERALS CH
1.0000 | ORAL_TABLET | Freq: Every day | ORAL | Status: DC
  Administered 2017-02-07 – 2017-02-15 (×9): 1 via ORAL
  Filled 2017-02-06 (×8): qty 1

## 2017-02-06 MED ORDER — LORATADINE 10 MG PO TABS
10.0000 mg | ORAL_TABLET | Freq: Every day | ORAL | Status: DC
  Administered 2017-02-07 – 2017-02-15 (×9): 10 mg via ORAL
  Filled 2017-02-06 (×9): qty 1

## 2017-02-06 MED ORDER — GABAPENTIN 100 MG PO CAPS
100.0000 mg | ORAL_CAPSULE | Freq: Every morning | ORAL | Status: DC
  Administered 2017-02-07 – 2017-02-15 (×9): 100 mg via ORAL
  Filled 2017-02-06 (×9): qty 1

## 2017-02-06 MED ORDER — INSULIN ASPART 100 UNIT/ML ~~LOC~~ SOLN
0.0000 [IU] | Freq: Three times a day (TID) | SUBCUTANEOUS | Status: DC
  Administered 2017-02-06: 3 [IU] via SUBCUTANEOUS
  Administered 2017-02-07: 15 [IU] via SUBCUTANEOUS
  Administered 2017-02-07 (×2): 8 [IU] via SUBCUTANEOUS
  Administered 2017-02-08: 3 [IU] via SUBCUTANEOUS
  Administered 2017-02-09: 5 [IU] via SUBCUTANEOUS
  Administered 2017-02-09 – 2017-02-10 (×2): 8 [IU] via SUBCUTANEOUS
  Administered 2017-02-10: 2 [IU] via SUBCUTANEOUS
  Administered 2017-02-10: 5 [IU] via SUBCUTANEOUS
  Administered 2017-02-11: 8 [IU] via SUBCUTANEOUS
  Administered 2017-02-11: 2 [IU] via SUBCUTANEOUS
  Administered 2017-02-12 (×2): 3 [IU] via SUBCUTANEOUS
  Administered 2017-02-13: 2 [IU] via SUBCUTANEOUS
  Administered 2017-02-13 (×2): 3 [IU] via SUBCUTANEOUS
  Administered 2017-02-14 (×2): 2 [IU] via SUBCUTANEOUS
  Administered 2017-02-14 – 2017-02-15 (×2): 3 [IU] via SUBCUTANEOUS

## 2017-02-06 MED ORDER — OMEGA-3-ACID ETHYL ESTERS 1 G PO CAPS
4.0000 g | ORAL_CAPSULE | Freq: Every day | ORAL | Status: DC
  Administered 2017-02-07 – 2017-02-15 (×9): 4 g via ORAL
  Filled 2017-02-06 (×9): qty 4

## 2017-02-06 MED ORDER — ROSUVASTATIN CALCIUM 10 MG PO TABS
10.0000 mg | ORAL_TABLET | Freq: Every day | ORAL | Status: DC
  Administered 2017-02-06 – 2017-02-14 (×9): 10 mg via ORAL
  Filled 2017-02-06 (×9): qty 1

## 2017-02-06 MED ORDER — GABAPENTIN 400 MG PO CAPS
400.0000 mg | ORAL_CAPSULE | Freq: Every day | ORAL | Status: DC
  Administered 2017-02-06 – 2017-02-14 (×9): 400 mg via ORAL
  Filled 2017-02-06 (×9): qty 1

## 2017-02-06 MED ORDER — INSULIN GLULISINE 100 UNIT/ML IJ SOLN: 6.0000 [IU] | Freq: Three times a day (TID) | INTRAMUSCULAR | Status: DC

## 2017-02-06 MED ORDER — PRO-STAT SUGAR FREE PO LIQD
30.0000 mL | Freq: Every day | ORAL | Status: DC
  Administered 2017-02-07 – 2017-02-10 (×4): 30 mL via ORAL
  Filled 2017-02-06 (×4): qty 30

## 2017-02-06 MED ORDER — ADULT MULTIVITAMIN W/MINERALS CH: 1.0000 | ORAL_TABLET | Freq: Every day | ORAL | Status: DC

## 2017-02-06 MED ORDER — PREDNISONE 10 MG PO TABS
10.0000 mg | ORAL_TABLET | Freq: Two times a day (BID) | ORAL | Status: AC
  Administered 2017-02-06 – 2017-02-07 (×2): 10 mg via ORAL
  Filled 2017-02-06 (×2): qty 1

## 2017-02-06 NOTE — PMR Pre-admission (Signed)
PMR Admission Coordinator Pre-Admission Assessment  Patient: Crystal [name] is an 66 y.o., female MRN: [id number] DOB: 25-Dec-1950 Height: 5\' 5"  (165.1 cm) Weight: 99.8 kg (220 lb)              Insurance Information HMO:     PPO:      PCP:      IPA:      80/20: yes     OTHER: no HMO PRIMARY: Medicare a and b      Policy#: [id number]      Subscriber: pt Benefits:  Phone #: passport one online     Name: 02/03/2017 Eff. Date: 1 11/23/14 b 07/26/15     Deduct: $1340      Out of Pocket Max: none      Life Max: none CIR: 100%      SNF: 20 full days Outpatient: 80%     Co-Pay: 20% Home Health: 100%      Co-Pay: none DME: 80%     Co-Pay: 20% Providers: pt choice  SECONDARY: [employer]/colonial [place]      Policy#: [id number]      Subscriber: pt  Medicaid Application Date:       Case Manager:  Disability Application Date:       Case Worker:   Emergency Contact Information Contact Information    Name Relation Home Work Mobile   Martin,Crissa Daughter   [phone]   Kathelene, Rumberger  [phone]     Pettey,Eric  [phone]     Penn,Evelyn Daughter [phone]       Current Medical History  Patient Admitting Diagnosis: debility after colitis/colectomy  History of Present Illness:HPI: Irving Lubbers Hughesis a 66 y.o.right handed femalewith history of diastolic congestive heart failure, nonischemic cardiomyopathy with defibrillator, CKD stage III, diabetes mellitus, OSA with CPAP, hypertension.Patient presented to [facility] 01/18/2017 with severe right lower quadrant pain. Per report CT showed distended colon presumed colitis. 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 Hospital course septic shock started on TPN. Patient required ongoing IV fluids started on pressors. . Repeat CT scan of the abdomen 01/21/2017 showed mild free abdominal and  pelvic fluid without evidence of abscess. Patient was transferred to [facility] 01/26/2017 for ongoing care. Follow-up CT abdomen and pelvis 01/27/2017 showed subtotal colectomy right abdominal proximal transverse colostomy. Small to moderate volume fluid in the peritoneal cavity.WOCconsulted with wet-to-dry dressings. Patient was extubated 01/29/2017 followed by critical care medicine. Blood cultures remain negative broad-spectrum antibiotics later discontinued. Acute blood loss anemia 8.1 and monitored.  Patient with episode of large black emesis 02/02/2017. Transfused 2 units packed red blood cell 02/03/2017. Diet was downgraded to clear liquids placed on PPI. Gastroenterology services consulted recommending conservative care. Leukocytosis 16,700 improved at 12,300 to 8800. Creatinine improved from 2.96-1.54. Patient with fungal UTI initially placed on intravenous Rocephin. Patient originally had a Foley tube. Discussed with infectious disease placed on intravenous Antidulafungin and await plan of duration. Diflucan avoided due to history of long QT. Diet has slowly been advanced to mechanical soft.   Past Medical History  Past Medical History:  Diagnosis Date  . Anemia, iron deficiency   . Angioedema    felt to likely be due to ace inhibitors but says she has had this even off of medicines, appears to be tolerating ARBs chronically  . Automatic implantable cardiac defibrillator in situ  high RV threshold chronically, device was turned off in 2014  . Back pain   . CHF (congestive heart failure) ([place])   . Chronic renal insufficiency   . Diabetes mellitus, type 2 ([place])   . Gout   . Hyperlipidemia   . Hypertension   . Intervertebral disc degeneration   . LBBB (left bundle branch block)   . Nonischemic cardiomyopathy ([place])    s/p ICD in 2005. EF has since recovered  . Obesity   . Systemic hypertension   . Unspecified hypothyroidism     Family History  family history includes  Arrhythmia in her mother; Cancer in her father; Diabetes Mellitus II in her mother and other; Heart disease in her father and other; Hypertension in her father and mother.  Prior Rehab/Hospitalizations:  Has the patient had major surgery during 100 days prior to admission? No  Current Medications   Current Facility-Administered Medications:  .  acetaminophen (TYLENOL) solution 650 mg, 650 mg, Oral, Q4H PRN, Hongalgi, Anand D, MD .  [COMPLETED] anidulafungin (ERAXIS) 200 mg in sodium chloride 0.9 % 200 mL IVPB, 200 mg, Intravenous, Once, Stopped at 02/05/17 2350 **FOLLOWED BY** anidulafungin (ERAXIS) 100 mg in sodium chloride 0.9 % 100 mL IVPB, 100 mg, Intravenous, Q24H, Bell, Mei W, RPH .  carvedilol (COREG) tablet 6.25 mg, 6.25 mg, Oral, BID WC, McQuaid, Douglas B, MD, 6.25 mg at 02/06/17 0807 .  febuxostat (ULORIC) tablet 40 mg, 40 mg, Oral, Daily, Hongalgi, Anand D, MD, 40 mg at 02/06/17 1037 .  feeding supplement (BOOST / RESOURCE BREEZE) liquid 1 Container, 1 Container, Oral, BID BM, Hongalgi, Lenis Dickinson, MD, 1 Container at 02/06/17 1037 .  feeding supplement (PRO-STAT SUGAR FREE 64) liquid 30 mL, 30 mL, Oral, Daily, Hongalgi, Anand D, MD, 30 mL at 02/06/17 1038 .  fentaNYL (SUBLIMAZE) injection 12.5-25 mcg, 12.5-25 mcg, Intravenous, Q2H PRN, Simonne Maffucci B, MD, 25 mcg at 02/05/17 2025 .  gabapentin (NEURONTIN) capsule 100 mg, 100 mg, Oral, q morning - 10a, Hongalgi, Anand D, MD, 100 mg at 02/06/17 1038 .  gabapentin (NEURONTIN) capsule 400 mg, 400 mg, Oral, QHS, Hongalgi, Anand D, MD, 400 mg at 02/05/17 2209 .  insulin aspart (novoLOG) injection 0-15 Units, 0-15 Units, Subcutaneous, TID WC, Hongalgi, Lenis Dickinson, MD, 8 Units at 02/06/17 1233 .  insulin aspart (novoLOG) injection 0-5 Units, 0-5 Units, Subcutaneous, QHS, Modena Jansky, MD, 3 Units at 02/04/17 2154 .  insulin aspart (novoLOG) injection 6 Units, 6 Units, Subcutaneous, TID WC, Hongalgi, Lenis Dickinson, MD, 6 Units at 02/06/17 1233 .   insulin glargine (LANTUS) injection 50 Units, 50 Units, Subcutaneous, BID, Modena Jansky, MD, 50 Units at 02/06/17 1038 .  levothyroxine (SYNTHROID, LEVOTHROID) tablet 88 mcg, 88 mcg, Oral, QAC breakfast, Raiford Noble [place], [place], 88 mcg at 02/06/17 3762 .  loratadine (CLARITIN) tablet 10 mg, 10 mg, Oral, Daily, Hongalgi, Anand D, MD, 10 mg at 02/06/17 1037 .  montelukast (SINGULAIR) tablet 10 mg, 10 mg, Oral, QHS, Sheikh, Omair [place], DO, 10 mg at 02/05/17 2210 .  multivitamin with minerals tablet 1 tablet, 1 tablet, Oral, Daily, Hongalgi, Lenis Dickinson, MD, 1 tablet at 02/06/17 1038 .  nystatin (MYCOSTATIN/NYSTOP) topical powder, , Topical, TID, Hongalgi, Anand D, MD .  omega-3 acid ethyl esters (LOVAZA) capsule 4 g, 4 g, Oral, Daily, Sheikh, Omair Latif, DO, 4 g at 02/06/17 1038 .  pantoprazole (PROTONIX) EC tablet 40 mg, 40 mg, Oral, Daily, Hongalgi, Anand D, MD, 40 mg at 02/06/17 1037 .  promethazine (PHENERGAN) tablet 12.5 mg, 12.5 mg, Oral, Q8H PRN, Vernell Leep D, MD, 12.5 mg at 02/02/17 0951 .  rosuvastatin (CRESTOR) tablet 10 mg, 10 mg, Oral, QHS, Sheikh, Omair [place], DO, 10 mg at 02/05/17 2210 .  sodium chloride flush (NS) 0.9 % injection 10-40 mL, 10-40 mL, Intracatheter, Q12H, Hongalgi, Anand D, MD, 10 mL at 02/03/17 0635 .  sodium chloride flush (NS) 0.9 % injection 10-40 mL, 10-40 mL, Intracatheter, PRN, Modena Jansky, MD, 10 mL at 02/03/17 0058  Patients Current Diet: DIET SOFT Room service appropriate? Yes; Fluid consistency: Thin  Precautions / Restrictions Precautions Precautions: Fall Restrictions Weight Bearing Restrictions: No   Has the patient had 2 or more falls or a fall with injury in the past year?No  Prior Activity Level Community (5-7x/wk): independent and driving pta. Retired Music therapist / Paramedic Devices/Equipment: None Home Equipment: None  Prior Device Use: Indicate devices/aids used by the patient prior to current  illness, exacerbation or injury? None of the above  Prior Functional Level Prior Function Level of Independence: Independent Comments: very active and independent  Self Care: Did the patient need help bathing, dressing, using the toilet or eating?  Independent  Indoor Mobility: Did the patient need assistance with walking from room to room (with or without device)? Independent  Stairs: Did the patient need assistance with internal or external stairs (with or without device)? Independent  Functional Cognition: Did the patient need help planning regular tasks such as shopping or remembering to take medications? Independent  Current Functional Level Cognition  Overall Cognitive Status: Within Functional Limits for tasks assessed Orientation Level: Oriented X4 Safety/Judgement: Decreased awareness of deficits General Comments: patient with poor insight throughout session, slow to respond at times despite cues.    Extremity Assessment (includes Sensation/Coordination)  Upper Extremity Assessment: Generalized weakness  Lower Extremity Assessment: Generalized weakness    ADLs  Overall ADL's : Needs assistance/impaired Eating/Feeding: Sitting, Set up Grooming: Minimal assistance, Sitting Upper Body Bathing: Sitting, Moderate assistance Lower Body Bathing: Total assistance, +2 for physical assistance, Sit to/from stand Upper Body Dressing : Sitting, Moderate assistance Lower Body Dressing: Total assistance, +2 for physical assistance, Sit to/from stand Toilet Transfer: +2 for physical assistance, Moderate assistance, Stand-pivot, RW Toilet Transfer Details (indicate cue type and reason): simulated to chair Toileting- Clothing Manipulation and Hygiene: Total assistance, +2 for physical assistance, Sit to/from stand Functional mobility during ADLs:  (not able to ambulate)    Mobility  Overal bed mobility: Needs Assistance Bed Mobility: Supine to Sit Supine to sit: Mod assist, Max  assist Sit to supine: Mod assist, +2 for safety/equipment General bed mobility comments: bridging with max assist otherwise mod to come up and forward via R elbow..  Cues for hand placement and sequencing    Transfers  Overall transfer level: Needs assistance Equipment used: Ambulation equipment used Transfer via Lift Equipment: Stedy Transfers: Sit to/from Stand, Risk manager Sit to Stand: Max assist, +2 physical assistance, Mod assist Stand pivot transfers: Mod assist, Max assist, +2 physical assistance General transfer comment: using RW pt transfered to the chair, with stability assist during pivot and help maneuvering the RW    Ambulation / Gait / Stairs / Wheelchair Mobility       Posture / Balance Dynamic Sitting Balance Sitting balance - Comments: with fatigue pt listed right, but able to hold EOB for short periods Balance Overall balance assessment: Needs assistance Sitting-balance support: Feet supported Sitting balance-Leahy Scale: Fair  Sitting balance - Comments: with fatigue pt listed right, but able to hold EOB for short periods Postural control: Posterior lean, Right lateral lean Standing balance support: Bilateral upper extremity supported Standing balance-Leahy Scale: Poor Standing balance comment: pt sit to stand x3 into RW twice from bed height, once from stedy height.  pt attained full upright posture and shifted feet aroun    Special needs/care consideration BiPAP/CPAP CPAP pta CPM  N/a Continuous Drip IV n/a Dialysis  N/a Life Vest  N/a Oxygen  N/a Special Bed  N/a Trach Size  N/a Wound Vac  N/a Skin  Tenna Child, RN Registered Nurse Addendum WOC  Consult Note Date of Service: 02/06/2017 1:47 PM      [] Hide copied text [place] Nurse ostomy consultnote Surgical team following for assessment and plan of care to midline abd surgical wound.  Stoma type/location: Pt had colostomy surgery on 6/27 at another facility, according to the EMR.   Stomal assessment/size: Stoma is dark brown and dusky; 1 3/4 inches, above skin level, when visualized through pouch which is intact with good seal.  Current pouch was applied by the bedside nurse this am; she stated the skin surrounding was red with partial thickness wounds and patchy areas of red rash. Treatment options for peristomal skin: Foam dressings were applied to protect skin and promote healing where MARSI has occurred.  Output: Mod amt small amt brown semi-formed stool Ostomy pouching: 1pc.  Education provided: Pt is preparing to transfer to rehab today.  Will continue to follow for ostomy teaching sessions prior to discharge home. Supplies at the [employer] nurse use. Enrolled patient in [place] program: No  Julien Girt MSN, RN, CWOCN, [place], CNS  02/06/2017    Revision History                        Blister and rash to abdomen, surgical incision bowel mgmt colostomy Diabetic mgmt  yes     Previous Home Environment Living Arrangements: Alone (lived alone in [place])  Lives With: Alone Available Help at Discharge: Family, Available 24 hours/day Type of Home: House Home Layout: Bed/bath upstairs, Full bath on main level, Two level Alternate Level Stairs-Rails: Can reach both Alternate Level Stairs-Number of Steps: 15 Home Access: Level entry Bathroom Shower/Tub: Multimedia programmer: Standard Bathroom Accessibility: Yes How Accessible: Accessible via walker [place]: No Additional Comments: this is daughters home layout  Discharge Living Setting Plans for Discharge Living Setting: Lives with (comment) (to live with daughter in [place] at d/c) Type of Home at Discharge: House Discharge Home Layout: 1/2 bath on main level, Able to live on main level with bedroom/bathroom, Two level Alternate Level Stairs-Rails: Right, Left, Can reach both Alternate Level Stairs-Number of Steps: 15 steps Discharge Home Access: Level  entry Discharge Bathroom Shower/Tub: Tub/shower unit, Curtain Discharge Bathroom Toilet: Standard Discharge Bathroom Accessibility: Yes How Accessible: Accessible via walker Does the patient have any problems obtaining your medications?: No  Social/Family/Support Systems Patient Roles: Parent (retired Therapist, sports) Sport and exercise psychologist Information: Zigmund Daniel, daughter Anticipated Caregiver: daughter and her family Anticipated Caregiver's Contact Information: [phone] Ability/Limitations of Caregiver: daughter likely to take some FMLA, labtech Caregiver Availability: 24/7 Discharge Plan Discussed with Primary Caregiver: Yes Is Caregiver In Agreement with Plan?: Yes Does Caregiver/Family have Issues with Lodging/Transportation while Pt is in Rehab?: No   Goals/Additional Needs Patient/Family Goal for Rehab: supervision with PT and OT Expected length of stay: ELOS 10- 14  days; pt states 5 days Equipment Needs: new colostomy Pt/Family Agrees to Admission and willing to participate: Yes Program Orientation Provided & Reviewed with Pt/Caregiver Including Roles  & Responsibilities: Yes   Decrease burden of Care through IP rehab admission: n/a  Possible need for SNF placement upon discharge: not anticipated  Patient Condition: This patient's medical and functional status has changed since the consult dated: 02/02/2017 in which the Rehabilitation Physician determined and documented that the patient's condition is appropriate for intensive rehabilitative care in an inpatient rehabilitation facility. See "History of Present Illness" (above) for medical update. Functional changes are: overall mod to max assist. Patient's medical and functional status update has been discussed with the Rehabilitation physician and patient remains appropriate for inpatient rehabilitation. Will admit to inpatient rehab today.  Preadmission Screen Completed By:  Cleatrice Burke, 02/06/2017 2:11  PM ______________________________________________________________________   Discussed status with Dr. Naaman Plummer on 02/06/2017 at  1421 and received telephone approval for admission today.  Admission Coordinator:  Cleatrice Burke, time [id number] Date 02/06/2017

## 2017-02-06 NOTE — Progress Notes (Signed)
I met with pt at bedside and she is in agreement to admit to CIR today.I will contact Dr. Algis Liming for d/c order. RN CM and SW made ware. I will make the arrangements. [phone]

## 2017-02-06 NOTE — Discharge Instructions (Signed)

## 2017-02-06 NOTE — H&P (Signed)
Physical Medicine and Rehabilitation Admission H&P    Chief complaint: Weakness  HPI: [name] is a 66 y.o. right handed female with history of diastolic congestive heart failure, nonischemic cardiomyopathy with defibrillator, CKD stage III, diabetes mellitus, OSA with CPAP, hypertension. Per chart review patient lives in [place]. Independent working part-time prior to admission. She plans to stay with her daughter in [place] on discharge. Daughter works during the day but family in the area to provide assistance as needed. Patient presented to [facility] 01/18/2017 with severe right lower quadrant pain. Per report CT showed distended colon presumed colitis. 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 Hospital course septic shock started on TPN. Patient required ongoing IV fluids started on pressors. . Repeat CT scan of the abdomen 01/21/2017 showed mild free abdominal and pelvic fluid without evidence of abscess. Patient was transferred to [facility] 01/26/2017 for ongoing care. Follow-up CT abdomen and pelvis 01/27/2017 showed subtotal colectomy right abdominal proximal transverse colostomy. Small to moderate volume fluid in the peritoneal cavity.WOC consulted with wet-to-dry dressings. Patient was extubated 01/29/2017 followed by critical care medicine. Blood cultures remain negative broad-spectrum antibiotics later discontinued. Acute blood loss anemia 8.1 and monitored.  Patient with episode of large black emesis 02/02/2017. Transfused 2 units packed red blood cell 02/03/2017. Diet was downgraded to clear liquids placed on PPI. Gastroenterology services consulted recommending conservative care. Leukocytosis 16,700 improved at 12,300 to 8800. Creatinine improved from 2.96-1.54. Patient with fungal UTI  initially placed on intravenous Rocephin. Patient originally had a Foley tube. Discussed with infectious disease placed on intravenous Anidulafungin and await plan of duration. Diflucan avoided due to history of long QT. Diet has slowly been advanced to mechanical soft. Physical and occupational therapy evaluations completed 02/01/2017 with recommendations of physical medicine rehabilitation consult. Patient was admitted for a comprehensive rehabilitation program  Review of Systems  Constitutional: Negative for chills and fever.  HENT: Negative for hearing loss.   Eyes: Negative for blurred vision and double vision.  Respiratory: Positive for shortness of breath.   Cardiovascular: Positive for leg swelling. Negative for chest pain and palpitations.  Gastrointestinal: Positive for abdominal pain and nausea.  Musculoskeletal: Positive for back pain, joint pain and myalgias.  Skin: Negative for rash.  Neurological: Positive for weakness. Negative for seizures.  All other systems reviewed and are negative.  Past Medical History:  Diagnosis Date  . Anemia, iron deficiency   . Angioedema    felt to likely be due to ace inhibitors but says she has had this even off of medicines, appears to be tolerating ARBs chronically  . Automatic implantable cardiac defibrillator in situ    high RV threshold chronically, device was turned off in 2014  . Back pain   . CHF (congestive heart failure) ([place])   . Chronic renal insufficiency   . Diabetes mellitus, type 2 ([place])   . Gout   . Hyperlipidemia   . Hypertension   . Intervertebral disc degeneration   . LBBB (left bundle branch block)   . Nonischemic cardiomyopathy ([place])    s/p ICD in 2005. EF has since recovered  . Obesity   . Systemic hypertension   . Unspecified hypothyroidism    Past Surgical History:  Procedure Laterality Date  . CARDIAC CATHETERIZATION  2005   no obstructive CAD per patient  .  CARDIAC DEFIBRILLATOR PLACEMENT  2005   BiV  ICD implanted,  LV lead is an epicardial lead  . CYST REMOVAL HAND Right 05/2013   thumb  . IMPLANTABLE CARDIOVERTER DEFIBRILLATOR GENERATOR CHANGE  2008  . TOTAL ABDOMINAL HYSTERECTOMY    . TRIGGER FINGER RELEASE  05/213  . VESICOVAGINAL FISTULA CLOSURE W/ TAH     Family History  Problem Relation Age of Onset  . Hypertension Father   . Heart disease Father   . Cancer Father        prostate  . Heart disease Other        (Maternal side) Ischemic heart disease  . Diabetes Mellitus II Other   . Arrhythmia Mother   . Diabetes Mellitus II Mother        Borderline DM  . Hypertension Mother    Social History:  reports that she has never smoked. She has never used smokeless tobacco. She reports that she does not drink alcohol or use drugs. Allergies:  Allergies  Allergen Reactions  . Glucophage [Metformin Hcl] Other (See Comments)    Renal failure  . Ace Inhibitors Swelling and Other (See Comments)    Angioedema  . Advicor [Niacin-Lovastatin Er] Other (See Comments)    Muscle aches  . Bystolic [Nebivolol Hcl] Other (See Comments)    Edema  . Erythromycin Diarrhea, Nausea And Vomiting and Other (See Comments)    *DERIVATIVES*  . Lipitor [Atorvastatin] Other (See Comments)    Muscle aches  . Lopid [Gemfibrozil] Other (See Comments)    Muscle aches  . Statins Other (See Comments)    Muscle aches  . Adhesive [Tape] Rash   Medications Prior to Admission  Medication Sig Dispense Refill  . aspirin 81 MG tablet Take 81 mg by mouth daily.    . bumetanide (BUMEX) 1 MG tablet Take 1 mg by mouth 2 (two) times daily.    . carvedilol (COREG) 12.5 MG tablet Take 12.5 mg by mouth 2 (two) times daily with a meal.     . CRESTOR 10 MG tablet Take 10 mg by mouth at bedtime.     . gabapentin (NEURONTIN) 100 MG capsule Take 100-400 mg by mouth See admin instructions. Take 100 mg by mouth in the morning and take 400 mg by mouth at bedtime    . Insulin Glargine (TOUJEO SOLOSTAR) 300 UNIT/ML SOPN  Inject 100 Units into the skin daily.     . insulin glulisine (APIDRA) 100 UNIT/ML injection Inject 20 Units into the skin 3 (three) times daily with meals.     Marland Kitchen levothyroxine (SYNTHROID, LEVOTHROID) 88 MCG tablet Take 88 mcg by mouth daily.     Marland Kitchen linagliptin (TRADJENTA) 5 MG TABS tablet Take 5 mg by mouth daily.    Marland Kitchen LINZESS 145 MCG CAPS capsule Take 145 mcg by mouth daily.     Marland Kitchen loratadine (CLARITIN) 10 MG tablet Take 10 mg by mouth daily.    . Magnesium 400 MG TABS Take 400 mg by mouth 2 (two) times daily.     . montelukast (SINGULAIR) 10 MG tablet Take 10 mg by mouth at bedtime.    Marland Kitchen omega-3 acid ethyl esters (LOVAZA) 1 g capsule Take 4 g by mouth daily.    . temazepam (RESTORIL) 15 MG capsule Take 15 mg by mouth at bedtime as needed for sleep.     Marland Kitchen ULORIC 40 MG tablet Take 40 mg by mouth daily.     . valsartan (DIOVAN) 160 MG tablet Take 160  mg by mouth daily.     . Vitamin D, Ergocalciferol, (DRISDOL) 50000 units CAPS capsule Take 50,000 Units by mouth once a week.   3  . acetaminophen-codeine (TYLENOL #3) 300-30 MG tablet Take 1 tablet by mouth 2 (two) times daily as needed for moderate pain.     . furosemide (LASIX) 20 MG tablet Take 1 tablet (20 mg total) by mouth daily. (Patient not taking: Reported on 01/26/2017) 90 tablet 3    Home: [place] expects to be discharged to:: Private residence Living Arrangements: Children Available Help at Discharge: Family Type of Home: House Home Access: Level entry Home Layout: Bed/bath upstairs, Full bath on main level, Two level Alternate Level Stairs-Number of Steps: 15 Alternate Level Stairs-Rails: Can reach both Bathroom Shower/Tub: Multimedia programmer: Standard Home Equipment: None   Functional History: Prior Function Level of Independence: Independent  Functional Status:  Mobility: Bed Mobility Overal bed mobility: Needs Assistance Bed Mobility: Supine to Sit Supine to sit: Mod assist, Max  assist Sit to supine: Mod assist, +2 for safety/equipment General bed mobility comments: bridging with max assist otherwise mod to come up and forward via R elbow..  Cues for hand placement and sequencing Transfers Overall transfer level: Needs assistance Equipment used: Ambulation equipment used Transfer via Lift Equipment: Stedy Transfers: Sit to/from Stand, Risk manager Sit to Stand: Max assist, +2 physical assistance, Mod assist Stand pivot transfers: Mod assist, Max assist, +2 physical assistance General transfer comment: using RW pt transfered to the chair, with stability assist during pivot and help maneuvering the RW      ADL: ADL Overall ADL's : Needs assistance/impaired Eating/Feeding: Sitting, Set up Grooming: Minimal assistance, Sitting Upper Body Bathing: Sitting, Moderate assistance Lower Body Bathing: Total assistance, +2 for physical assistance, Sit to/from stand Upper Body Dressing : Sitting, Moderate assistance Lower Body Dressing: Total assistance, +2 for physical assistance, Sit to/from stand Toilet Transfer: +2 for physical assistance, Moderate assistance, Stand-pivot, RW Toilet Transfer Details (indicate cue type and reason): simulated to chair Toileting- Clothing Manipulation and Hygiene: Total assistance, +2 for physical assistance, Sit to/from stand Functional mobility during ADLs:  (not able to ambulate)  Cognition: Cognition Overall Cognitive Status: Within Functional Limits for tasks assessed Orientation Level: Oriented X4 Cognition Arousal/Alertness: Awake/alert Behavior During Therapy: WFL for tasks assessed/performed Overall Cognitive Status: Within Functional Limits for tasks assessed Area of Impairment: Safety/judgement, Awareness, Problem solving Safety/Judgement: Decreased awareness of deficits Awareness: Emergent Problem Solving: Slow processing General Comments: patient with poor insight throughout session, slow to respond at times  despite cues.  Physical Exam: Blood pressure 118/66, pulse 93, temperature 99.6 F (37.6 C), temperature source Oral, resp. rate 18, height _0  (1.651 m), weight 99.8 kg (220 lb), SpO2 97 %. Physical Exam  Vitals reviewed. HENT:  Head: Normocephalic.  Eyes: EOM are normal.  Neck: Normal range of motion. Neck supple. No tracheal deviation present. No thyromegaly present.  Cardiovascular: Normal rate, regular rhythm and normal heart sounds.  Exam reveals no friction rub.   No murmur heard. Respiratory: Effort normal. No respiratory distress. She has no wheezes. She has no rales.  GI: Soft. Bowel sounds are normal. There is tenderness.  Colostomy intact  Musculoskeletal:  Right foot/great toe red/warm/tender to palpation with surrounding edema.   Skin:  Wet-to-dry dressing changes to abdominal wound. Wound bed pink and moist. Stoma/pouch sealed.   Psychiatric:  Generally pleasant but slightly anxious. cooperative  Neurological: She is alert and oriented to person, place,  and time. No cranial nerve deficit.  UE 4/5 prox to distal bilaterally. LE: 3/5 HF,  3+/5 KE and 4/5 ADF/PF bilaterally. No sensory deficits.   Results for orders placed or performed during the hospital encounter of 01/26/17 (from the past 48 hour(s))  Glucose, capillary     Status: Abnormal   Collection Time: 02/04/17 12:00 PM  Result Value Ref Range   Glucose-Capillary 241 (H) 65 - 99 mg/dL  Glucose, capillary     Status: Abnormal   Collection Time: 02/04/17  4:54 PM  Result Value Ref Range   Glucose-Capillary 284 (H) 65 - 99 mg/dL  Glucose, capillary     Status: Abnormal   Collection Time: 02/04/17  9:24 PM  Result Value Ref Range   Glucose-Capillary 257 (H) 65 - 99 mg/dL  CBC     Status: Abnormal   Collection Time: 02/05/17  5:05 AM  Result Value Ref Range   WBC 9.3 4.0 - 10.5 K/uL   RBC 2.62 (L) 3.87 - 5.11 MIL/uL   Hemoglobin 7.4 (L) 12.0 - 15.0 g/dL   HCT 23.0 (L) 36.0 - 46.0 %   MCV 87.8 78.0 -  100.0 fL   MCH 28.2 26.0 - 34.0 pg   MCHC 32.2 30.0 - 36.0 g/dL   RDW 17.4 (H) 11.5 - 15.5 %   Platelets 348 150 - 400 K/uL  Basic metabolic panel     Status: Abnormal   Collection Time: 02/05/17  5:05 AM  Result Value Ref Range   Sodium 135 135 - 145 mmol/L   Potassium 4.5 3.5 - 5.1 mmol/L   Chloride 108 101 - 111 mmol/L   CO2 19 (L) 22 - 32 mmol/L   Glucose, Bld 141 (H) 65 - 99 mg/dL   BUN 61 (H) 6 - 20 mg/dL   Creatinine, Ser 1.79 (H) 0.44 - 1.00 mg/dL   Calcium 8.7 (L) 8.9 - 10.3 mg/dL   GFR calc non Af Amer 28 (L) >60 mL/min   GFR calc Af Amer 33 (L) >60 mL/min    Comment: (NOTE) The eGFR has been calculated using the CKD EPI equation. This calculation has not been validated in all clinical situations. eGFR's persistently <60 mL/min signify possible Chronic Kidney Disease.    Anion gap 8 5 - 15  Glucose, capillary     Status: Abnormal   Collection Time: 02/05/17  7:41 AM  Result Value Ref Range   Glucose-Capillary 116 (H) 65 - 99 mg/dL  Glucose, capillary     Status: Abnormal   Collection Time: 02/05/17 12:25 PM  Result Value Ref Range   Glucose-Capillary 185 (H) 65 - 99 mg/dL  CBC     Status: Abnormal   Collection Time: 02/05/17  2:24 PM  Result Value Ref Range   WBC 10.8 (H) 4.0 - 10.5 K/uL   RBC 2.62 (L) 3.87 - 5.11 MIL/uL   Hemoglobin 7.1 (L) 12.0 - 15.0 g/dL   HCT 23.2 (L) 36.0 - 46.0 %   MCV 88.5 78.0 - 100.0 fL   MCH 27.1 26.0 - 34.0 pg   MCHC 30.6 30.0 - 36.0 g/dL   RDW 17.1 (H) 11.5 - 15.5 %   Platelets 348 150 - 400 K/uL  Prepare RBC     Status: None   Collection Time: 02/05/17  4:15 PM  Result Value Ref Range   Order Confirmation ORDER PROCESSED BY BLOOD BANK   Glucose, capillary     Status: Abnormal   Collection Time: 02/05/17  4:52  PM  Result Value Ref Range   Glucose-Capillary 165 (H) 65 - 99 mg/dL  Glucose, capillary     Status: Abnormal   Collection Time: 02/05/17  9:12 PM  Result Value Ref Range   Glucose-Capillary 161 (H) 65 - 99 mg/dL    CBC with Differential/Platelet     Status: Abnormal   Collection Time: 02/05/17 11:35 PM  Result Value Ref Range   WBC 10.1 4.0 - 10.5 K/uL   RBC 3.06 (L) 3.87 - 5.11 MIL/uL   Hemoglobin 8.3 (L) 12.0 - 15.0 g/dL   HCT 26.4 (L) 36.0 - 46.0 %   MCV 86.3 78.0 - 100.0 fL   MCH 27.1 26.0 - 34.0 pg   MCHC 31.4 30.0 - 36.0 g/dL   RDW 16.8 (H) 11.5 - 15.5 %   Platelets 316 150 - 400 K/uL   Neutrophils Relative % 64 %   Neutro Abs 6.5 1.7 - 7.7 K/uL   Lymphocytes Relative 20 %   Lymphs Abs 2.0 0.7 - 4.0 K/uL   Monocytes Relative 11 %   Monocytes Absolute 1.1 (H) 0.1 - 1.0 K/uL   Eosinophils Relative 5 %   Eosinophils Absolute 0.5 0.0 - 0.7 K/uL   Basophils Relative 0 %   Basophils Absolute 0.0 0.0 - 0.1 K/uL  CBC     Status: Abnormal   Collection Time: 02/06/17  5:28 AM  Result Value Ref Range   WBC 8.8 4.0 - 10.5 K/uL   RBC 2.94 (L) 3.87 - 5.11 MIL/uL   Hemoglobin 8.1 (L) 12.0 - 15.0 g/dL   HCT 25.5 (L) 36.0 - 46.0 %   MCV 86.7 78.0 - 100.0 fL   MCH 27.6 26.0 - 34.0 pg   MCHC 31.8 30.0 - 36.0 g/dL   RDW 16.9 (H) 11.5 - 15.5 %   Platelets 324 150 - 400 K/uL  Basic metabolic panel     Status: Abnormal   Collection Time: 02/06/17  5:28 AM  Result Value Ref Range   Sodium 137 135 - 145 mmol/L   Potassium 4.4 3.5 - 5.1 mmol/L   Chloride 109 101 - 111 mmol/L   CO2 20 (L) 22 - 32 mmol/L   Glucose, Bld 103 (H) 65 - 99 mg/dL   BUN 41 (H) 6 - 20 mg/dL   Creatinine, Ser 1.54 (H) 0.44 - 1.00 mg/dL   Calcium 8.8 (L) 8.9 - 10.3 mg/dL   GFR calc non Af Amer 34 (L) >60 mL/min   GFR calc Af Amer 39 (L) >60 mL/min    Comment: (NOTE) The eGFR has been calculated using the CKD EPI equation. This calculation has not been validated in all clinical situations. eGFR's persistently <60 mL/min signify possible Chronic Kidney Disease.    Anion gap 8 5 - 15  Glucose, capillary     Status: Abnormal   Collection Time: 02/06/17  8:01 AM  Result Value Ref Range   Glucose-Capillary 102 (H) 65 - 99  mg/dL   No results found.     Medical Problem List and Plan: 1.  Debilitated secondary to colitis/status post colectomy right abdominal proximal transverse colostomy 01/27/2017  -admit to inpatient rehab 2.  DVT Prophylaxis/Anticoagulation: SCDs. Monitor for any signs of DVT 3. Pain Management: Neurontin 100 mg every morning 400 mg daily at bedtime 4. Mood: Provide emotional support 5. Neuropsych: This patient is capable of making decisions on her own behalf. 6. Skin/Wound Care: Routine colostomy care 7. Fluids/Electrolytes/Nutrition: Routine I&O with follow-up chemistries 8.Hypertension. Coreg  6.25 mg twice a day 9. Diastolic congestive heart failure. Monitor for any signs of fluid overload 10. Cardiomyopathy with defibrillator. No chest pain or shortness of breath 11. CKD stage III. Follow-up chemistries upon admission 12. Diabetes mellitus and peripheral neuropathy. Lantus insulin 50 units twice a day,NovoLog 6 units 3 times a day. Check blood sugars before meals and at bedtime. Provide diabetic teaching  -SSI for brief prednisone treatment 13. Acute on chronic anemia. Follow-up CBC. Transfuse 2 units packed red blood cell 02/03/2017 Follow-up GI as needed. Continue PPI 14. Fungal UTI. Presently on ERAXIS and will discuss plan of duration. Diflucan avoided due to history of long QT 15. OSA.CPAP. Singulair 10 mg daily 16. Gout. Uloric 40 mg daily. Acute flare of right toe---will give prednisone 30m po x 2  17. Hyperlipidemia. Lovaza/Crestor 18. Hypothyroidism. Synthroid  Post Admission Physician Evaluation: 1. Functional deficits secondary  To debility after colitis/colectomy. 2. Patient is admitted to receive collaborative, interdisciplinary care between the physiatrist, rehab nursing staff, and therapy team. 3. Patient's level of medical complexity and substantial therapy needs in context of that medical necessity cannot be provided at a lesser intensity of care such as a  SNF. 4. Patient has experienced substantial functional loss from his/her baseline which was documented above under the "Functional History" and "Functional Status" headings.  Judging by the patient's diagnosis, physical exam, and functional history, the patient has potential for functional progress which will result in measurable gains while on inpatient rehab.  These gains will be of substantial and practical use upon discharge  in facilitating mobility and self-care at the household level. 5. Physiatrist will provide 24 hour management of medical needs as well as oversight of the therapy plan/treatment and provide guidance as appropriate regarding the interaction of the two. 6. The Preadmission Screening has been reviewed and patient status is unchanged unless otherwise stated above. 7. 24 hour rehab nursing will assist with bladder management, bowel management, safety, skin/wound care, disease management, medication administration, pain management and patient education  and help integrate therapy concepts, techniques,education, etc. 8. PT will assess and treat for/with: Lower extremity strength, range of motion, stamina, balance, functional mobility, safety, adaptive techniques and equipment, pain mgt, family education, ego support.   Goals are: supervision. 9. OT will assess and treat for/with: ADL's, functional mobility, safety, upper extremity strength, adaptive techniques and equipment, ego support, pain control, family ed.   Goals are: supervision. Therapy may not yet proceed with showering this patient. 10. SLP will assess and treat for/with: n/a.  Goals are: n/a. 11. Case Management and Social Worker will assess and treat for psychological issues and discharge planning. 12. Team conference will be held weekly to assess progress toward goals and to determine barriers to discharge. 13. Patient will receive at least 3 hours of therapy per day at least 5 days per week. 14. ELOS: 10-14 days        15. Prognosis:  excellent     ZMeredith Staggers MD, FFultonPhysical Medicine & Rehabilitation 02/06/2017  ACathlyn Parsons, PA-C 02/06/2017

## 2017-02-06 NOTE — Progress Notes (Signed)
Physical Therapy Treatment Patient Details Name: Crystal [name] MRN: [id number] DOB: [birth year] Today's Date: 02/06/2017    History of Present Illness 66 year old female with PMH as below, which is significant for CHF with defibrillator in place, CKD, DM, OSA on CPAP, COPD, and Hypothyroidism. She was admitted to [facility] with severe RLQ pain on 6/25. Upon admission CT showed distended colon. She was presumed to have colitis and was admitted to the hospitalists. The following day she became more lethargic and hypotensive. She was moved to CCU and general surgery became involved. 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 Colostomy placed and abdomen left open. Post-operatively she was shocky despite 4-5L of IVF. She was started on pressors. Renal function worsened and she was diuresed with lasix drip putting out about 12-30L. At that point she was felt to be improving. Pressors able to come off. She was tolerating WUA. Family requested transfer to [facility] 7/5 when it was felt that the patient was not progressing as expected.     PT Comments    Emphasis today on standing technique, pre-gait and short distance ambulation with the RW.  Pt ready to  Participate in CIR.   Follow Up Recommendations  CIR     Equipment Recommendations  Rolling walker with 5" wheels    Recommendations for Other Services       Precautions / Restrictions Precautions Precautions: Fall    Mobility  Bed Mobility           Sit to supine: Mod assist   General bed mobility comments: pt goes down to sidelying too slowly to get her LE's in  Transfers Overall transfer level: Needs assistance Equipment used: Rolling walker (2 wheeled) Transfers: Sit to/from Stand Sit to Stand: Mod assist;+2 safety/equipment         General transfer comment: cues for hand placement and assist to come forward and  a significant amount of boost assist  Ambulation/Gait Ambulation/Gait assistance: Min assist;Mod assist Ambulation Distance (Feet): 12 Feet (x2 with sitting rest between) Assistive device: Rolling walker (2 wheeled) Gait Pattern/deviations: Step-through pattern;Decreased step length - right;Decreased step length - left;Decreased stance time - right Gait velocity: slow Gait velocity interpretation: Below normal speed for age/gender General Gait Details: mildly unsteady and weak gait.  Antalgic a times on R gouty great toe.  Heavy use of the RW needing maneuvering assist.   Stairs            Wheelchair Mobility    Modified Rankin (Stroke Patients Only)       Balance   Sitting-balance support: Feet supported Sitting balance-Leahy Scale: Fair     Standing balance support: Bilateral upper extremity supported Standing balance-Leahy Scale: Poor Standing balance comment: sit to stand and w/shift over course fo 1 min, prior to ambulation                            Cognition Arousal/Alertness: Awake/alert Behavior During Therapy: WFL for tasks assessed/performed Overall Cognitive Status: Within Functional Limits for tasks assessed                                        Exercises      General Comments        Pertinent Vitals/Pain  Pain Assessment: Faces Faces Pain Scale: Hurts little more Pain Location: R great toe MP jt Pain Descriptors / Indicators: Aching;Sharp Pain Intervention(s): Monitored during session    Home Living   Living Arrangements: Alone (lived alone in [place]) Available Help at Discharge: Family;Available 24 hours/day           Additional Comments: this is daughters home layout    Prior Function        Comments: very active and independent   PT Goals (current goals can now be found in the care plan section) Acute Rehab PT Goals Patient Stated Goal: to go home PT Goal Formulation: With  patient/family Time For Goal Achievement: 02/15/17 Potential to Achieve Goals: Good Progress towards PT goals: Progressing toward goals    Frequency    Min 3X/week      PT Plan Current plan remains appropriate    Co-evaluation              AM-PAC PT "6 Clicks" Daily Activity  Outcome Measure  Difficulty turning over in bed (including adjusting bedclothes, sheets and blankets)?: Total Difficulty moving from lying on back to sitting on the side of the bed? : Total Difficulty sitting down on and standing up from a chair with arms (e.g., wheelchair, bedside commode, etc,.)?: Total Help needed moving to and from a bed to chair (including a wheelchair)?: A Lot Help needed walking in hospital room?: A Little Help needed climbing 3-5 steps with a railing? : A Lot 6 Click Score: 10    End of Session   Activity Tolerance: Patient tolerated treatment well Patient left: in bed;with bed alarm set;with call bell/phone within reach;with family/visitor present Nurse Communication: Mobility status PT Visit Diagnosis: Unsteadiness on feet (R26.81);Other abnormalities of gait and mobility (R26.89)     Time: 1529-1550 PT Time Calculation (min) (ACUTE ONLY): 21 min  Charges:  $Gait Training: 8-22 mins                    G Codes:       02/21/17  Donnella Sham, PT [phone]  (pager)   Tessie Fass Janelli Welling 2017-02-21, 4:15 PM

## 2017-02-06 NOTE — Discharge Summary (Addendum)
Physician Discharge Summary  Crystal [name] STM:[id number] DOB: 02/27/1951  PCP: Dannial Monarch D., PA-C  Admit date: 01/26/2017 Discharge date: 02/06/2017  Recommendations for Outpatient Follow-up:  1. Dannial Monarch, PA-C/PCP, Upon discharge from inpatient rehabilitation. 2. Dr. Sherryl Manges, Surgery in [place], [place]: Upon discharge from inpatient rehabilitation. 3. Please follow labs (CBC & BMP) every 48 hours while in CIR to ensure continued improvement of renal insufficiency and stability of anemia.  Home Health: None Equipment/Devices: None    Discharge Condition: Improved and stable  CODE STATUS: Full  Diet recommendation: Heart healthy & diabetic diet.  Discharge Diagnoses:  Active Problems:   Nonischemic cardiomyopathy (HCC)   Chronic renal insufficiency   Essential hypertension   Anemia, iron deficiency   Diabetes mellitus, type 2 (HCC)   Chronic systolic heart failure (HCC)   HLD (hyperlipidemia)   Disease of thyroid gland   Colitis   Pressure injury of skin   S/P colostomy ([place])   Brief Summary: 66 year old female with PMH of chronic systolic CHF secondary to nonischemic cardiomyopathy, 2005 without significant CAD, chronic LBBB, BiV AICD, questionable history of angioedema on ACEI but tolerated ARB, chronic kidney disease, DM 2, OSA on nightly CPAP, COPD on nightly oxygen 2 L/m along with CPAP, hypothyroid, presented initially to [facility] with 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 Extubated 01/29/17 and transferred to [facility] service on 01/31/17. CCS following. Self-limited acute upper GI bleed. Transfused 2 units  PRBCs for acute blood loss anemia. Improving.   Assessment & Plan:   1. Fulminant colitis, s/p partial colectomy with transverse colostomy, Dr. Matt Holmes 02/17/17 at [facility]: Open midline incision for which she is undergoing wet-to-dry dressing changes. [place] team assisting with ostomy management. Had an episode of large "black" emesis on 7/12. GI bleed resolved. Advanced diet to soft and tolerating. Surgery following. Discussed with general surgery and have cleared patient for discharge to CIR. There was a leak around the colostomy site today and [facility] RN has evaluated-please follow their recommendations. As per general surgery, patient may follow-up with primary surgeon in [place] upon discharge from [place]. 2. Self-limited acute UGIB: Patient had an episode of large black emesis on 7/12. Diet was downgraded to clears. Placed on IV PPI. GI was consulted and recommended conservative management. GI bleed clinically resolved. Continue Protonix. Aspirin 81 MG daily on hold, consider resuming at discharge from CIR if she has no further bleeding issues. 3. Acute respiratory failure with hypoxia/VDRF: Extubated 01/29/17. Incentive spirometry. Chest x-ray 7/11 personally reviewed in no acute findings seen. Resolved. Saturating in the mid 90s on room air. 4. Septic shock: Due to fulminant colitis/peritonitis. Status post surgery as above. Resolved. Antibiotics completed. Blood cultures negative. 5. Chronic systolic CHF/NICM/chronic LBBB/BiV AICD: As per outpatient Cardiology follow-up notes Dr. Harl Bowie, previously severe LV systolic dysfunction that normalized with medical treatment and AICD. BiV no longer active as reached ERI, was not replaced due to normalized LV function. Echo 04/10/16: LVEF 45-50 percent and grade 1 diastolic dysfunction. ARB held due to acute on chronic kidney disease. Continue carvedilol and rosuvastatin. Reviewed EKG 7/11 with a cardiologist and corrected QTC is 420 ms. Clinically  does not appear overtly volume overloaded. Patient apparently  was not taking Lasix prior to admission but was on Bumex which is currently held due to recent acute on chronic kidney injury and clinically euvolemic status. Consider resuming Bumex in the next couple of days if her creatinine continues to improve and remained stable. 6. Acute on stage III chronic kidney disease: Patient presented with creatinine of 2.17 which had improved gradually to 1.53. Baseline creatinine not known. Creatinine worsened to 2.47 on 7/13. This may be due to hemodynamics from acute blood loss and urinary retention. Continue to hold ARB and diuretics. Creatinine improved to 1.59. Trend BMP. 7. Acute urinary retention: Bladder scan on 7/13 showed 800+ mL urine. Patient able to sit on bedside commode and voided 900 ML.Voiding spontaneously but does not go until she is quite distended. She states she does have sensation of bladder filling. Advised to go on bedside commode every 6 hours. Monitor. 8. Type II DM:  at home, patient takes Tujeo and Apidra. In the hospital she was placed on equivalent dose of Lantus but at split dose Lantus and mealtime NovoLog with SSI. At discharge to CIR, changed back to adjusted dose of home medications but if these medications are not available, may continue insulin regimen that she was on in the hospital. Also at discharge from CIR, consider changing back patient's Tujeo to once daily as she was taking prior to hospital admission. 9. Acute blood loss anemia complicating Iron deficiency anemia: Hemoglobin dropped to a low of 6.7 on 7/12. Status post 3 units PRBC transfusion thus far. Hemoglobin has periodically dropped even in the absence of overt GI bleed. As per patient's report today, she has "chronic anemia" and reports hemoglobin usually in the 90-10 range but "whenever I get sick, my blood counts dropped and I get blood transfusion". Reports receiving blood transfusion after pacemaker placement.  Hemoglobin 8.1 today. Monitor periodically and transfuse as needed for hemoglobin less than 7 g per DL. If has very frequent drops requiring transfusions, may consider GI evaluation. 10. COPD: Stable without clinical bronchospasm. 11. Hyperlipidemia: Continue rosuvastatin 12. Essential hypertension: Controlled. Continue carvedilol and monitor. 13. Reactive thrombocytosis: Periodically follow CBCs. Resolved. 14. OSA: Nightly CPAP. Patient now using home CPAP. 15. Hyponatremia: Likely due to hyperglycemia. Resolved. 16. Hypothyroid: Continue Synthroid. 17. Dysuria/Fungal UTI: UA positive. Started IV Rocephin pending urine culture results. Has low-grade fever and mild leukocytosis. Had Foley catheter around the time of surgery. Urine culture again showing only yeast. DC IV ceftriaxone. Discussed with ID MD on call and treat with IV Anidulafungin (aviding Diflucan d/t h/o long QT). Complete 3 days of IV Anidulafungin and then discontinue.   Consultants:  CCM-signed off General surgery  [facility] M.D.  Procedures:  Extubated 01/29/2017 Left upper extremity PICC line-removed.   Discharge Instructions  Discharge Instructions    (HEART FAILURE PATIENTS) Call MD:  Anytime you have any of the following symptoms: 1) 3 pound weight gain in 24 hours or 5 pounds in 1 week 2) shortness of breath, with or without a dry hacking cough 3) swelling in the hands, feet or stomach 4) if you have to sleep on extra pillows at night in order to breathe.    Complete by:  As directed    Call MD for:    Complete by:  As directed    Vomiting blood or coffee-ground colored material. Black stools or blood in stools.   Call MD for:  difficulty breathing, headache or visual disturbances    Complete by:  As directed  Call MD for:  extreme fatigue    Complete by:  As directed    Call MD for:  persistant dizziness or light-headedness    Complete by:  As directed    Call MD for:  persistant nausea and  vomiting    Complete by:  As directed    Call MD for:  redness, tenderness, or signs of infection (pain, swelling, redness, odor or green/yellow discharge around incision site)    Complete by:  As directed    Call MD for:  severe uncontrolled pain    Complete by:  As directed    Call MD for:  temperature >100.4    Complete by:  As directed    Diet - low sodium heart healthy    Complete by:  As directed    Diet Carb Modified    Complete by:  As directed    Increase activity slowly    Complete by:  As directed        Medication List    STOP taking these medications   aspirin 81 MG tablet   bumetanide 1 MG tablet Commonly known as:  BUMEX   furosemide 20 MG tablet Commonly known as:  LASIX   valsartan 160 MG tablet Commonly known as:  DIOVAN     TAKE these medications   acetaminophen-codeine 300-30 MG tablet Commonly known as:  TYLENOL #3 Take 1 tablet by mouth 2 (two) times daily as needed for moderate pain.   anidulafungin 100 mg in sodium chloride 0.9 % 100 mL Inject 100 mg into the vein daily. Discontinue after 02/07/17 dose.   carvedilol 12.5 MG tablet Commonly known as:  COREG Take 0.5 tablets (6.25 mg total) by mouth 2 (two) times daily with a meal. What changed:  how much to take   CRESTOR 10 MG tablet Generic drug:  rosuvastatin Take 10 mg by mouth at bedtime.   feeding supplement (PRO-STAT SUGAR FREE 64) Liqd Take 30 mLs by mouth daily.   feeding supplement Liqd Take 1 Container by mouth 2 (two) times daily between meals.   gabapentin 100 MG capsule Commonly known as:  NEURONTIN Take 100-400 mg by mouth See admin instructions. Take 100 mg by mouth in the morning and take 400 mg by mouth at bedtime   Insulin Glargine 300 UNIT/ML Sopn Commonly known as:  TOUJEO SOLOSTAR Inject 50 Units into the skin 2 (two) times daily. What changed:  how much to take  when to take this   insulin glulisine 100 UNIT/ML injection Commonly known as:   APIDRA Inject 0.06 mLs (6 Units total) into the skin 3 (three) times daily with meals. What changed:  how much to take   levothyroxine 88 MCG tablet Commonly known as:  SYNTHROID, LEVOTHROID Take 88 mcg by mouth daily.   LINZESS 145 MCG Caps capsule Generic drug:  linaclotide Take 145 mcg by mouth daily.   loratadine 10 MG tablet Commonly known as:  CLARITIN Take 10 mg by mouth daily.   Magnesium 400 MG Tabs Take 400 mg by mouth 2 (two) times daily.   montelukast 10 MG tablet Commonly known as:  SINGULAIR Take 10 mg by mouth at bedtime.   multivitamin with minerals Tabs tablet Take 1 tablet by mouth daily.   nystatin powder Commonly known as:  MYCOSTATIN/NYSTOP Apply topically 3 (three) times daily. Apply to rash under abdominal folds and right armpit.   omega-3 acid ethyl esters 1 g capsule Commonly known as:  LOVAZA Take 4 g  by mouth daily.   pantoprazole 40 MG tablet Commonly known as:  PROTONIX Take 1 tablet (40 mg total) by mouth daily.   temazepam 15 MG capsule Commonly known as:  RESTORIL Take 15 mg by mouth at bedtime as needed for sleep.   TRADJENTA 5 MG Tabs tablet Generic drug:  linagliptin Take 5 mg by mouth daily.   ULORIC 40 MG tablet Generic drug:  febuxostat Take 40 mg by mouth daily.   Vitamin D (Ergocalciferol) 50000 units Caps capsule Commonly known as:  DRISDOL Take 50,000 Units by mouth once a week.      Follow-up Information    Teofilo Pod., PA-C. Schedule an appointment as soon as possible for a visit.   Specialty:  Internal Medicine Why:  Upon discharge from inpatient rehabilitation. Contact information: [address] [place] [address] [phone]        Sherryl Manges, MD Follow up.   Specialty:  Surgical Oncology Why:  Upon discharge from inpatient rehabilitation. Contact information: [address], [address] [place] [address] [phone]          Allergies  Allergen Reactions  . Glucophage  [Metformin Hcl] Other (See Comments)    Renal failure  . Ace Inhibitors Swelling and Other (See Comments)    Angioedema  . Advicor [Niacin-Lovastatin Er] Other (See Comments)    Muscle aches  . Bystolic [Nebivolol Hcl] Other (See Comments)    Edema  . Erythromycin Diarrhea, Nausea And Vomiting and Other (See Comments)    *DERIVATIVES*  . Lipitor [Atorvastatin] Other (See Comments)    Muscle aches  . Lopid [Gemfibrozil] Other (See Comments)    Muscle aches  . Statins Other (See Comments)    Muscle aches  . Adhesive [Tape] Rash      Procedures/Studies: Ct Abdomen Pelvis Wo Contrast  Result Date: 01/27/2017 CLINICAL DATA:  Patient admitted from outside hospital. Recent subtotal colectomy for fulminant colitis. Hypotension. EXAM: CT ABDOMEN AND PELVIS WITHOUT CONTRAST TECHNIQUE: Multidetector CT imaging of the abdomen and pelvis was performed following the standard protocol without IV contrast. COMPARISON:  None. FINDINGS: Lower chest: Bilateral pleural effusions are associated with bilateral lower lobe collapse/ consolidation. Hepatobiliary: Prominence of the left hepatic lobe with subtle nodular hepatic contour raises the question of cirrhosis. Probable layering tiny stones in the gallbladder. No intrahepatic or extrahepatic biliary dilation. Pancreas: No focal mass lesion. No dilatation of the main duct. No intraparenchymal cyst. No peripancreatic edema. Spleen: No splenomegaly. No focal mass lesion. Adrenals/Urinary Tract: 2.1 cm right adrenal nodule with average attenuation of 7 Hounsfield units is compatible with adenoma. Left adrenal gland unremarkable. No focal abnormality is seen in either kidney on this noncontrast exam. No hydronephrosis. No evidence for hydroureter. Urinary bladder is decompressed by a Foley catheter and gas in the bladder lumen is compatible with the instrumentation. Stomach/Bowel: NG tube tip is in the antral stomach. Duodenum is normally positioned as is the  ligament of Treitz. Small bowel loops are nondilated. No wall thickening in the terminal ileum. Appendix unremarkable. Patient is status post right lower quadrant transverse and colostomy. Long Hartmann's pouch noted. Vascular/Lymphatic: There is abdominal aortic atherosclerosis without aneurysm. There is no gastrohepatic or hepatoduodenal ligament lymphadenopathy. No intraperitoneal or retroperitoneal lymphadenopathy. No pelvic sidewall lymphadenopathy. Reproductive: Uterus surgically absent.  There is no adnexal mass. Other: Small to moderate volume free fluid evident. There is some dependent debris or peritoneal thickening in the posterior pelvic cul-de-sac. No definite focal or organized fluid collection in  the peritoneal cavity to suggest abscess on this noncontrast exam. Musculoskeletal: Diffuse body wall edema noted. Midline surgical wound appears open caudally. Bone windows reveal no worrisome lytic or sclerotic osseous lesions. IMPRESSION: 1. Status post subtotal colectomy with right abdominal proximal transverse and colostomy and Hartman's pouch. 2. Small to moderate volume fluid in the peritoneal cavity without organized collection to suggest a discrete abscess. There is some increased attenuation along the posterior peritoneal lining of the cul-de-sac which may be related to layering debris or hemorrhage. 3. Probable cholelithiasis. 4.  Aortic Atherosclerois (ICD10-170.0) 5. NG tube tip is in the distal stomach. 6. Diffuse body wall edema with caudal portion of the midline wound apparently open. Electronically Signed   By: Misty Stanley M.D.   On: 01/27/2017 11:14   Dg Chest Port 1 View  Result Date: 02/01/2017 CLINICAL DATA:  Shortness of breath. EXAM: PORTABLE CHEST 1 VIEW COMPARISON:  Radiographs of January 29, 2017. FINDINGS: Stable cardiomediastinal silhouette. Left-sided pacemaker is unchanged in position. No pneumothorax or pleural effusion is noted. No consolidative process is noted. Bony thorax  is unremarkable. IMPRESSION: No acute cardiopulmonary abnormality seen. Electronically Signed   By: Marijo Conception, M.D.   On: 02/01/2017 07:12   Dg Chest Port 1 View  Result Date: 01/29/2017 CLINICAL DATA:  Respiratory failure. EXAM: PORTABLE CHEST 1 VIEW COMPARISON:  January 28, 2017 FINDINGS: The ETT is in good position. The right PICC line is stable. No pneumothorax. Stable cardiomegaly. Support apparatus partially obscures the right base. Bibasilar effusions and possible tiny left effusion are identified. Pulmonary venous congestion remains. IMPRESSION: Support apparatus is in good position. Pulmonary venous congestion. Bibasilar opacities, left greater than right, are unchanged. Electronically Signed   By: Dorise Bullion III M.D   On: 01/29/2017 06:45   Dg Chest Port 1 View  Result Date: 01/28/2017 CLINICAL DATA:  Evaluate ET tube. EXAM: PORTABLE CHEST 1 VIEW COMPARISON:  Chest radiograph 01/26/2017. FINDINGS: ET tube terminates in the mid trachea left upper extremity PICC line is present with tip coursing toward the azygos vein. Enteric tube courses inferior to the diaphragm. Multi lead AICD device overlies the left hemithorax. Stable cardiomegaly. Leftward patient rotation. Low lung volumes. Improving bibasilar heterogeneous opacities. Probable small bilateral pleural effusions. IMPRESSION: Low lung volumes with small effusions and basilar opacities which may represent atelectasis and or edema. Overall mid and lower lung opacities are improved when compared to prior. Left upper extremity PICC line present with tip coursing toward the azygos vein. ET tube terminates in the mid trachea. Electronically Signed   By: Lovey Newcomer M.D.   On: 01/28/2017 07:05   Dg Chest Port 1 View  Result Date: 01/26/2017 CLINICAL DATA:  PICC line placement EXAM: PORTABLE CHEST 1 VIEW COMPARISON:  Earlier the same day FINDINGS: 2002 hours. Endotracheal tube tip is approximately 3.5 cm above the base of the carina. The NG  tube passes into the stomach although the distal tip position is not included on the film. A left-sided PICC line is evident but somewhat difficult to see given the superimposed pacer/AICD wires and poor opacity of the PICC line. The tip appears to be overlying the spine at approximately the expected level of mid SVC. The catheter is following the same course as the pacer leads appear similar to the prior study. The cardio pericardial silhouette is enlarged. Pulmonary vascular congestion noted with interval progression of bibasilar collapse/consolidation and probable bilateral pleural effusions. IMPRESSION: Left PICC line tip is in a position overlying  the mid SVC, the following a similar course as the pacer wires. Interval progression of bibasilar collapse/ consolidation. Electronically Signed   By: Misty Stanley M.D.   On: 01/26/2017 20:42   Dg Chest Port 1 View  Result Date: 01/26/2017 CLINICAL DATA:  Endotracheal tube placement EXAM: PORTABLE CHEST 1 VIEW COMPARISON:  01/30/2014 FINDINGS: Endotracheal tube with tip at the clavicular heads. An orogastric tube reaches the stomach. Chronic cardiopericardial enlargement. Vascular pedicle widening. These changes are progressed from prior in the setting of semi erect position. Two right-sided transvenous pacer leads and a left-sided direct epicardial pacer. Lead positioning is similar to prior. There is pulmonary edema and likely small pleural effusions. No air bronchogram. IMPRESSION: 1. Unremarkable positioning of endotracheal and orogastric tubes. 2. Layering pleural effusions and probable pulmonary edema. Chronic cardiomegaly. 3. Low volume chest. Electronically Signed   By: Monte Fantasia M.D.   On: 01/26/2017 15:16      Subjective: Seen this morning. Denies complaints. No abdominal pain. Tolerating diet. Passing normal colored stools. No bleeding reported. No chest pain, dyspnea, dizziness or lightheadedness. As per RN, some stool leak around the  colostomy site which has been seen and addressed by [facility] RN.  Discharge Exam:  Vitals:   02/05/17 2110 02/06/17 0521 02/06/17 0751 02/06/17 1332  BP: (!) 126/49 (!) 119/47 118/66 (!) 130/47  Pulse: 98 94 93 99  Resp: 18 18  (!) 22  Temp: 100.2 F (37.9 C) 99.6 F (37.6 C)  99.5 F (37.5 C)  TempSrc: Oral Oral  Oral  SpO2: 96% 97%  96%  Weight:      Height:        General exam: Pleasant middle-aged female, moderately built and obese lying comfortably propped up in bed. Respiratory system: Clear to auscultation. No increased work of breathing. Cardiovascular system: S1 and S2 heard, RRR. No JVD, murmurs or pedal edema. Telemetry: SR, BBB morphology. Gastrointestinal system: Surgical dressing clean and dry. Colostomy draining minimal amount of brown soft stool. Not distended. Soft. Nontender. No organomegaly or masses felt. Normal bowel sounds heard.  Central nervous system: Alert and oriented. No focal neurological deficits. Stable Extremities: Symmetric 5 x 5 power. Skin: No rashes, lesions or ulcers Psychiatry: Judgement and insight appear normal. Mood & affect appropriate.    The results of significant diagnostics from this hospitalization (including imaging, microbiology, ancillary and laboratory) are listed below for reference.     Microbiology: Recent Results (from the past 240 hour(s))  Culture, Urine     Status: Abnormal   Collection Time: 02/04/17 10:06 AM  Result Value Ref Range Status   Specimen Description URINE, RANDOM  Final   Special Requests NONE  Final   Culture 80,000 COLONIES/mL YEAST (A)  Final   Report Status 02/05/2017 FINAL  Final     Labs: CBC:  Recent Labs Lab 02/01/17 0426  02/04/17 0602 02/05/17 0505 02/05/17 1424 02/05/17 2335 02/06/17 0528  WBC 12.3*  < > 13.6* 9.3 10.8* 10.1 8.8  NEUTROABS 8.8*  --   --   --   --  6.5  --   HGB 9.8*  < > 8.5* 7.4* 7.1* 8.3* 8.1*  HCT 31.0*  < > 26.7* 23.0* 23.2* 26.4* 25.5*  MCV 86.4  < > 87.3 87.8  88.5 86.3 86.7  PLT 571*  < > 390 348 348 316 324  < > = values in this interval not displayed. Basic Metabolic Panel:  Recent Labs Lab 01/31/17 0337 02/01/17 0426 02/02/17 0525 02/03/17  0630 02/04/17 0602 02/05/17 0505 02/06/17 0528  NA 135 135 129* 129* 135 135 137  K 4.2 4.4 4.9 4.6 4.5 4.5 4.4  CL 95* 99* 97* 100* 106 108 109  CO2 28 26 21* 20* 20* 19* 20*  GLUCOSE 130* 189* 350* 187* 132* 141* 103*  BUN 103* 67* 112* 114* 86* 61* 41*  CREATININE 1.96* 1.53* 1.81* 2.47* 2.02* 1.79* 1.54*  CALCIUM 9.2 9.2 8.7* 8.5* 8.8* 8.7* 8.8*  MG 2.7* 2.4  --   --   --   --   --   PHOS 4.5 3.4  --   --   --   --   --    Liver Function Tests:  Recent Labs Lab 02/01/17 0426  AST 20  ALT 17  ALKPHOS 131*  BILITOT 0.9  PROT 7.9  ALBUMIN 3.7   CBG:  Recent Labs Lab 02/05/17 1225 02/05/17 1652 02/05/17 2112 02/06/17 0801 02/06/17 1217  GLUCAP 185* 165* 161* 102* 258*   Urinalysis    Component Value Date/Time   COLORURINE YELLOW 02/04/2017 1006   APPEARANCEUR TURBID (A) 02/04/2017 1006   LABSPEC 1.011 02/04/2017 1006   PHURINE 5.0 02/04/2017 1006   GLUCOSEU NEGATIVE 02/04/2017 1006   HGBUR MODERATE (A) 02/04/2017 1006   BILIRUBINUR NEGATIVE 02/04/2017 1006   KETONESUR NEGATIVE 02/04/2017 1006   PROTEINUR 100 (A) 02/04/2017 1006   NITRITE NEGATIVE 02/04/2017 1006   LEUKOCYTESUR LARGE (A) 02/04/2017 1006      Time coordinating discharge: Over 30 minutes  SIGNED:  Vernell Leep, MD, FACP, FHM. [facility] Pager [phone]  If 7PM-7AM, please contact night-coverage [email or web address] Password [facility] 02/06/2017, 3:45 PM     Responding to a CDI Query  Acute Upper GI bleeding could have been possibly due to Stress Gastritis but other etiologies not excluded.  Vernell Leep, MD, FACP, FHM. [facility] Pager [phone]  If 7PM-7AM, please contact night-coverage [email or web address] Password [email or web address] 02/09/2017, 2:17 PM

## 2017-02-06 NOTE — H&P (Signed)
Physical Medicine and Rehabilitation Admission H&P  Chief complaint: Weakness  HPI: Crystal [name] is a 66 y.o. right handed female with history of diastolic congestive heart failure, nonischemic cardiomyopathy with defibrillator, CKD stage III, diabetes mellitus, OSA with CPAP, hypertension. Per chart review patient lives in [place]. Independent working part-time prior to admission. She plans to stay with her daughter in [place] on discharge. Daughter works during the day but family in the area to provide assistance as needed. Patient presented to [facility] 01/18/2017 with severe right lower quadrant pain. Per report CT showed distended colon presumed colitis. 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 Hospital course septic shock started on TPN. Patient required ongoing IV fluids started on pressors. . Repeat CT scan of the abdomen 01/21/2017 showed mild free abdominal and pelvic fluid without evidence of abscess. Patient was transferred to [facility] 01/26/2017 for ongoing care. Follow-up CT abdomen and pelvis 01/27/2017 showed subtotal colectomy right abdominal proximal transverse colostomy. Small to moderate volume fluid in the peritoneal cavity.WOC consulted with wet-to-dry dressings. Patient was extubated 01/29/2017 followed by critical care medicine. Blood cultures remain negative broad-spectrum antibiotics later discontinued. Acute blood loss anemia 8.1 and monitored. Patient with episode of large black emesis 02/02/2017. Transfused 2 units packed red blood cell 02/03/2017. Diet was downgraded to clear liquids placed on PPI. Gastroenterology services consulted recommending conservative care. Leukocytosis 16,700 improved at 12,300 to 8800. Creatinine improved from 2.96-1.54. Patient with fungal UTI initially placed  on intravenous Rocephin. Patient originally had a Foley tube. Discussed with infectious disease placed on intravenous Anidulafungin and await plan of duration. Diflucan avoided due to history of long QT. Diet has slowly been advanced to mechanical soft. Physical and occupational therapy evaluations completed 02/01/2017 with recommendations of physical medicine rehabilitation consult. Patient was admitted for a comprehensive rehabilitation program  Review of Systems  Constitutional: Negative for chills and fever.  HENT: Negative for hearing loss.  Eyes: Negative for blurred vision and double vision.  Respiratory: Positive for shortness of breath.  Cardiovascular: Positive for leg swelling. Negative for chest pain and palpitations.  Gastrointestinal: Positive for abdominal pain and nausea.  Musculoskeletal: Positive for back pain, joint pain and myalgias.  Skin: Negative for rash.  Neurological: Positive for weakness. Negative for seizures.  All other systems reviewed and are negative.       Past Medical History:  Diagnosis Date  . Anemia, iron deficiency   . Angioedema    felt to likely be due to ace inhibitors but says she has had this even off of medicines, appears to be tolerating ARBs chronically  . Automatic implantable cardiac defibrillator in situ    high RV threshold chronically, device was turned off in 2014  . Back pain   . CHF (congestive heart failure) ([place])   . Chronic renal insufficiency   . Diabetes mellitus, type 2 ([place])   . Gout   . Hyperlipidemia   . Hypertension   . Intervertebral disc degeneration   . LBBB (left bundle branch block)   . Nonischemic cardiomyopathy ([place])    s/p ICD in 2005. EF has since recovered  . Obesity   . Systemic hypertension   . Unspecified hypothyroidism         Past Surgical History:  Procedure Laterality Date  . CARDIAC CATHETERIZATION  2005   no obstructive CAD per  patient  . CARDIAC DEFIBRILLATOR PLACEMENT  2005   BiV ICD  implanted, LV lead is an epicardial lead  . CYST REMOVAL HAND Right 05/2013   thumb  . IMPLANTABLE CARDIOVERTER DEFIBRILLATOR GENERATOR CHANGE  2008  . TOTAL ABDOMINAL HYSTERECTOMY    . TRIGGER FINGER RELEASE  05/213  . VESICOVAGINAL FISTULA CLOSURE W/ TAH          Family History  Problem Relation Age of Onset  . Hypertension Father   . Heart disease Father   . Cancer Father    prostate  . Heart disease Other    (Maternal side) Ischemic heart disease  . Diabetes Mellitus II Other   . Arrhythmia Mother   . Diabetes Mellitus II Mother    Borderline DM  . Hypertension Mother    Social History: reports that she has never smoked. She has never used smokeless tobacco. She reports that she does not drink alcohol or use drugs.  Allergies:       Allergies  Allergen Reactions  . Glucophage [Metformin Hcl] Other (See Comments)    Renal failure  . Ace Inhibitors Swelling and Other (See Comments)    Angioedema  . Advicor [Niacin-Lovastatin Er] Other (See Comments)    Muscle aches  . Bystolic [Nebivolol Hcl] Other (See Comments)    Edema  . Erythromycin Diarrhea, Nausea And Vomiting and Other (See Comments)    *DERIVATIVES*  . Lipitor [Atorvastatin] Other (See Comments)    Muscle aches  . Lopid [Gemfibrozil] Other (See Comments)    Muscle aches  . Statins Other (See Comments)    Muscle aches  . Adhesive [Tape] Rash         Medications Prior to Admission  Medication Sig Dispense Refill  . aspirin 81 MG tablet Take 81 mg by mouth daily.    . bumetanide (BUMEX) 1 MG tablet Take 1 mg by mouth 2 (two) times daily.    . carvedilol (COREG) 12.5 MG tablet Take 12.5 mg by mouth 2 (two) times daily with a meal.     . CRESTOR 10 MG tablet Take 10 mg by mouth at bedtime.     . gabapentin (NEURONTIN) 100 MG capsule Take 100-400 mg by mouth See admin instructions. Take 100 mg by mouth in the morning and take 400 mg by mouth at bedtime    . Insulin Glargine (TOUJEO SOLOSTAR) 300 UNIT/ML  SOPN Inject 100 Units into the skin daily.     . insulin glulisine (APIDRA) 100 UNIT/ML injection Inject 20 Units into the skin 3 (three) times daily with meals.     Marland Kitchen levothyroxine (SYNTHROID, LEVOTHROID) 88 MCG tablet Take 88 mcg by mouth daily.     Marland Kitchen linagliptin (TRADJENTA) 5 MG TABS tablet Take 5 mg by mouth daily.    Marland Kitchen LINZESS 145 MCG CAPS capsule Take 145 mcg by mouth daily.     Marland Kitchen loratadine (CLARITIN) 10 MG tablet Take 10 mg by mouth daily.    . Magnesium 400 MG TABS Take 400 mg by mouth 2 (two) times daily.     . montelukast (SINGULAIR) 10 MG tablet Take 10 mg by mouth at bedtime.    Marland Kitchen omega-3 acid ethyl esters (LOVAZA) 1 g capsule Take 4 g by mouth daily.    . temazepam (RESTORIL) 15 MG capsule Take 15 mg by mouth at bedtime as needed for sleep.     Marland Kitchen ULORIC 40 MG tablet Take 40 mg by mouth daily.     . valsartan (  DIOVAN) 160 MG tablet Take 160 mg by mouth daily.     . Vitamin D, Ergocalciferol, (DRISDOL) 50000 units CAPS capsule Take 50,000 Units by mouth once a week.   3  . acetaminophen-codeine (TYLENOL #3) 300-30 MG tablet Take 1 tablet by mouth 2 (two) times daily as needed for moderate pain.     . furosemide (LASIX) 20 MG tablet Take 1 tablet (20 mg total) by mouth daily. (Patient not taking: Reported on 01/26/2017) 90 tablet 3   Home:  [place] expects to be discharged to:: Private residence  Living Arrangements: Children  Available Help at Discharge: Family  Type of Home: House  Home Access: Level entry  Home Layout: Bed/bath upstairs, Full bath on main level, Two level  Alternate Level Stairs-Number of Steps: 15  Alternate Level Stairs-Rails: Can reach both  Bathroom Shower/Tub: Tourist information centre manager: Standard  Home Equipment: None  Functional History:  Prior Function  Level of Independence: Independent  Functional Status:  Mobility:  Bed Mobility  Overal bed mobility: Needs Assistance  Bed Mobility: Supine to Sit  Supine to sit: Mod  assist, Max assist  Sit to supine: Mod assist, +2 for safety/equipment  General bed mobility comments: bridging with max assist otherwise mod to come up and forward via R elbow.. Cues for hand placement and sequencing  Transfers  Overall transfer level: Needs assistance  Equipment used: Ambulation equipment used  Transfer via Lift Equipment: Stedy  Transfers: Sit to/from Stand, Risk manager  Sit to Stand: Max assist, +2 physical assistance, Mod assist  Stand pivot transfers: Mod assist, Max assist, +2 physical assistance  General transfer comment: using RW pt transfered to the chair, with stability assist during pivot and help maneuvering the RW    ADL:  ADL  Overall ADL's : Needs assistance/impaired  Eating/Feeding: Sitting, Set up  Grooming: Minimal assistance, Sitting  Upper Body Bathing: Sitting, Moderate assistance  Lower Body Bathing: Total assistance, +2 for physical assistance, Sit to/from stand  Upper Body Dressing : Sitting, Moderate assistance  Lower Body Dressing: Total assistance, +2 for physical assistance, Sit to/from stand  Toilet Transfer: +2 for physical assistance, Moderate assistance, Stand-pivot, RW  Toilet Transfer Details (indicate cue type and reason): simulated to chair  Toileting- Clothing Manipulation and Hygiene: Total assistance, +2 for physical assistance, Sit to/from stand  Functional mobility during ADLs: (not able to ambulate)  Cognition:  Cognition  Overall Cognitive Status: Within Functional Limits for tasks assessed  Orientation Level: Oriented X4  Cognition  Arousal/Alertness: Awake/alert  Behavior During Therapy: WFL for tasks assessed/performed  Overall Cognitive Status: Within Functional Limits for tasks assessed  Area of Impairment: Safety/judgement, Awareness, Problem solving  Safety/Judgement: Decreased awareness of deficits  Awareness: Emergent  Problem Solving: Slow processing  General Comments: patient with poor insight  throughout session, slow to respond at times despite cues.  Physical Exam:  Blood pressure 118/66, pulse 93, temperature 99.6 F (37.6 C), temperature source Oral, resp. rate 18, height 5\' 5"  (1.651 m), weight 99.8 kg (220 lb), SpO2 97 %.  Physical Exam  Vitals reviewed.  HENT:  Head: Normocephalic.  Eyes: EOM are normal.  Neck: Normal range of motion. Neck supple. No tracheal deviation present. No thyromegaly present.  Cardiovascular: Normal rate, regular rhythm and normal heart sounds. Exam reveals no friction rub.  No murmur heard.  Respiratory: Effort normal. No respiratory distress. She has no wheezes. She has no rales.  GI: Soft. Bowel sounds are normal. There  is tenderness.  Colostomy intact  Musculoskeletal:  Right foot/great toe red/warm/tender to palpation with surrounding edema.  Skin:  Wet-to-dry dressing changes to abdominal wound. Wound bed pink and moist. Stoma/pouch sealed.  Psychiatric:  Generally pleasant but slightly anxious. cooperative  Neurological: She is alert and oriented to person, place, and time. No cranial nerve deficit.  UE 4/5 prox to distal bilaterally. LE: 3/5 HF, 3+/5 KE and 4/5 ADF/PF bilaterally. No sensory deficits.  Lab Results Last 48 Hours  Imaging Results (Last 48 hours)     Medical Problem List and Plan:  1. Debilitated secondary to colitis/status post colectomy right abdominal proximal transverse colostomy 01/27/2017  -admit to inpatient rehab  2. DVT Prophylaxis/Anticoagulation: SCDs. Monitor for any signs of DVT  3. Pain Management: Neurontin 100 mg every morning 400 mg daily at bedtime  4. Mood: Provide emotional support  5. Neuropsych: This patient is capable of making decisions on her own behalf.  6. Skin/Wound Care: Routine colostomy care  7. Fluids/Electrolytes/Nutrition: Routine I&O with follow-up chemistries  8.Hypertension. Coreg 6.25 mg twice a day  9. Diastolic congestive heart failure. Monitor for any signs of fluid overload  10. Cardiomyopathy with defibrillator. No chest pain or shortness of breath  11. CKD stage III. Follow-up chemistries upon admission  12. Diabetes mellitus and peripheral neuropathy. Lantus insulin 50 units twice a day,NovoLog 6 units 3 times a day. Check blood sugars before meals and at bedtime. Provide diabetic teaching  -SSI for brief prednisone treatment  13. Acute on chronic anemia. Follow-up CBC. Transfuse 2 units packed red blood cell 02/03/2017 Follow-up GI as needed. Continue PPI  14. Fungal UTI. Presently on ERAXIS and will discuss plan of duration. Diflucan avoided due to history of long QT  15. OSA.CPAP. Singulair 10 mg daily  16. Gout. Uloric 40 mg daily. Acute flare of right toe---will give prednisone 10mg  po x 2  17. Hyperlipidemia. Lovaza/Crestor  18. Hypothyroidism. Synthroid  Post Admission Physician Evaluation:  1. Functional deficits secondary To debility after colitis/colectomy. 2. Patient is admitted to receive collaborative, interdisciplinary care between the physiatrist, rehab nursing staff, and therapy team. 3. Patient's level of medical complexity and substantial therapy needs in context of that  medical necessity cannot be provided at a lesser intensity of care such as a SNF. 4. Patient has experienced substantial functional loss from his/her baseline which was documented above under the "Functional History" and "Functional Status" headings. Judging by the patient's diagnosis, physical exam, and functional history, the patient has potential for functional progress which will result in measurable gains while on inpatient rehab. These gains will be of substantial and practical use upon discharge in facilitating mobility and self-care at the household level. 5. Physiatrist will provide 24 hour management of medical needs as well as oversight of the therapy plan/treatment and provide guidance as appropriate regarding the interaction of the two. 6. The Preadmission Screening has been reviewed and patient status is unchanged unless otherwise stated above. 7. 24 hour rehab nursing will assist with bladder management, bowel management, safety, skin/wound care, disease management, medication administration, pain management and patient education and help integrate therapy concepts, techniques,education, etc. 8. PT will assess and treat for/with: Lower extremity strength, range of motion, stamina, balance, functional mobility, safety, adaptive techniques and equipment, pain mgt, family education, ego support. Goals are: supervision. 9. OT will assess and treat for/with: ADL's, functional mobility, safety, upper extremity strength, adaptive techniques and equipment, ego support, pain control, family ed. Goals are: supervision. Therapy may not yet proceed with showering this patient. 10. SLP will assess and treat for/with: n/a. Goals are: n/a. 11. Case Management and Social Worker will assess and treat for psychological issues and discharge planning. 12. Team conference will be held weekly to assess progress toward goals and to determine barriers to discharge. 13. Patient will receive at least 3 hours of  therapy per day at least 5 days per week. 14. ELOS: 10-14 days  15. Prognosis: excellent [name], MD, Mellody Drown  [place]  02/06/2017  ANGIULLI,DANIEL J., PA-C  02/06/2017

## 2017-02-06 NOTE — Progress Notes (Signed)
Crystal [name] to be D/C'd to inpatient rehab per MD order.  Discussed with the patient and all questions fully answered.  Report called to receiving rehabilitation nurse.  Patient escorted via bed with belongings to [id number].  [name] Price 02/06/2017 4:23 PM

## 2017-02-06 NOTE — Care Management Important Message (Signed)
Important Message  Patient Details  Name: Crystal [name] MRN: [id number] Date of Birth: 01/30/1951   Medicare Important Message Given:  Yes    Hasini Peachey Montine Circle 02/06/2017, 2:50 PM

## 2017-02-06 NOTE — Progress Notes (Signed)
Crystal Staggers, MD Physician Signed Physical Medicine and Rehabilitation  Consult Note Date of Service: 02/02/2017 5:58 AM  Related encounter: Admission (Discharged) from 01/26/2017 in [facility] All Collapse All   [] Hide copied text [] Hover for attribution information      Physical Medicine and Rehabilitation Consult Reason for Consult: Decreased functional mobility with debilitation Referring Physician: [facility]   HPI: Crystal [name] is a 66 y.o. right handed female with history of diastolic congestive heart failure, nonischemic cardiomyopathy with defibrillator, CKD stage III, diabetes mellitus, OSA with CPAP, hypertension. Per chart review patient lives in [place]. Independent working part-time prior to admission. She plans to stay with her daughter in [place] on discharge. Daughter works during the day but family in the area to provide assistance as needed. Patient presented to [facility] 01/18/2017 with severe right lower quadrant pain. Per report CT showed distended colon presumed colitis. 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 Hospital course septic shock started on TPN. Patient required ongoing IV fluids started on pressors. . Repeat CT scan of the abdomen 01/21/2017 showed mild free abdominal and pelvic fluid without evidence of abscess. Patient was [facility] to [facility]/26/2017 for ongoing care. Follow-up CT abdomen and pelvis 01/27/2017 showed subtotal colectomy right abdominal proximal transverse colostomy. Small to moderate volume fluid in the peritoneal cavity.WOC consulted with wet-to-dry dressings. Patient was extubated 01/29/2017 followed by critical care medicine. Blood cultures remain negative broad-spectrum antibiotics later discontinued.  Acute blood loss anemia 9.8 and monitored. Leukocytosis 16,700 improved at 12,300. Creatinine improved from 2.96-1.53. Diet has slowly been advanced to mechanical soft. Physical and occupational therapy evaluations completed 02/01/2017 with recommendations of physical medicine rehabilitation consult.   Review of Systems  Constitutional: Negative for chills and fever.  HENT: Negative for hearing loss.   Eyes: Negative for blurred vision and double vision.  Respiratory: Positive for shortness of breath.   Cardiovascular: Positive for leg swelling. Negative for chest pain and palpitations.  Gastrointestinal: Positive for abdominal pain and nausea. Negative for vomiting.  Genitourinary: Negative for dysuria and hematuria.  Musculoskeletal: Positive for back pain, joint pain and myalgias.  Skin: Negative for rash.  Neurological: Positive for weakness.  All other systems reviewed and are negative.      Past Medical History:  Diagnosis Date  . Anemia, iron deficiency   . Angioedema    felt to likely be due to ace inhibitors but says she has had this even off of medicines, appears to be tolerating ARBs chronically  . Automatic implantable cardiac defibrillator in situ    high RV threshold chronically, device was turned off in 2014  . Back pain   . CHF (congestive heart failure) ([place])   . Chronic renal insufficiency   . Diabetes mellitus, type 2 ([place])   . Gout   . Hyperlipidemia   . Hypertension   . Intervertebral disc degeneration   . LBBB (left bundle branch block)   . Nonischemic cardiomyopathy ([place])    s/p ICD in 2005. EF has since recovered  . Obesity   . Systemic hypertension   . Unspecified hypothyroidism         Past Surgical History:  Procedure Laterality Date  . CARDIAC CATHETERIZATION  2005   no obstructive CAD per patient  . CARDIAC DEFIBRILLATOR PLACEMENT  2005   BiV ICD implanted,  LV lead is an epicardial lead  . CYST REMOVAL HAND Right  05/2013   thumb  . IMPLANTABLE CARDIOVERTER DEFIBRILLATOR GENERATOR CHANGE  2008  . TOTAL ABDOMINAL HYSTERECTOMY    . TRIGGER FINGER RELEASE  05/213  . VESICOVAGINAL FISTULA CLOSURE W/ TAH          Family History  Problem Relation Age of Onset  . Hypertension Father   . Heart disease Father   . Cancer Father        prostate  . Heart disease Other        (Maternal side) Ischemic heart disease  . Diabetes Mellitus II Other   . Arrhythmia Mother   . Diabetes Mellitus II Mother        Borderline DM  . Hypertension Mother    Social History:  reports that she has never smoked. She has never used smokeless tobacco. She reports that she does not drink alcohol or use drugs. Allergies:       Allergies  Allergen Reactions  . Glucophage [Metformin Hcl] Other (See Comments)    Renal failure  . Ace Inhibitors Swelling and Other (See Comments)    Angioedema  . Advicor [Niacin-Lovastatin Er] Other (See Comments)    Muscle aches  . Bystolic [Nebivolol Hcl] Other (See Comments)    Edema  . Erythromycin Diarrhea, Nausea And Vomiting and Other (See Comments)    *DERIVATIVES*  . Lipitor [Atorvastatin] Other (See Comments)    Muscle aches  . Lopid [Gemfibrozil] Other (See Comments)    Muscle aches  . Statins Other (See Comments)    Muscle aches  . Adhesive [Tape] Rash         Medications Prior to Admission  Medication Sig Dispense Refill  . aspirin 81 MG tablet Take 81 mg by mouth daily.    . bumetanide (BUMEX) 1 MG tablet Take 1 mg by mouth 2 (two) times daily.    . carvedilol (COREG) 12.5 MG tablet Take 12.5 mg by mouth 2 (two) times daily with a meal.     . CRESTOR 10 MG tablet Take 10 mg by mouth at bedtime.     . gabapentin (NEURONTIN) 100 MG capsule Take 100-400 mg by mouth See admin instructions. Take 100 mg by mouth in the morning and take 400 mg by mouth at bedtime    . Insulin Glargine (TOUJEO SOLOSTAR) 300 UNIT/ML SOPN Inject  100 Units into the skin daily.     . insulin glulisine (APIDRA) 100 UNIT/ML injection Inject 20 Units into the skin 3 (three) times daily with meals.     Marland Kitchen levothyroxine (SYNTHROID, LEVOTHROID) 88 MCG tablet Take 88 mcg by mouth daily.     Marland Kitchen linagliptin (TRADJENTA) 5 MG TABS tablet Take 5 mg by mouth daily.    Marland Kitchen LINZESS 145 MCG CAPS capsule Take 145 mcg by mouth daily.     Marland Kitchen loratadine (CLARITIN) 10 MG tablet Take 10 mg by mouth daily.    . Magnesium 400 MG TABS Take 400 mg by mouth 2 (two) times daily.     . montelukast (SINGULAIR) 10 MG tablet Take 10 mg by mouth at bedtime.    Marland Kitchen omega-3 acid ethyl esters (LOVAZA) 1 g capsule Take 4 g by mouth daily.    . temazepam (RESTORIL) 15 MG capsule Take 15 mg by mouth at bedtime as needed for sleep.     Marland Kitchen ULORIC 40 MG tablet Take 40 mg by mouth daily.     Marland Kitchen  valsartan (DIOVAN) 160 MG tablet Take 160 mg by mouth daily.     . Vitamin D, Ergocalciferol, (DRISDOL) 50000 units CAPS capsule Take 50,000 Units by mouth once a week.   3  . acetaminophen-codeine (TYLENOL #3) 300-30 MG tablet Take 1 tablet by mouth 2 (two) times daily as needed for moderate pain.     . furosemide (LASIX) 20 MG tablet Take 1 tablet (20 mg total) by mouth daily. (Patient not taking: Reported on 01/26/2017) 90 tablet 3    Home: Home Living Family/patient expects to be discharged to:: Private residence Living Arrangements: Children Available Help at Discharge: Family Type of Home: House Home Access: Level entry Home Layout: Bed/bath upstairs, Full bath on main level, Two level Alternate Level Stairs-Number of Steps: 15 Alternate Level Stairs-Rails: Can reach both Bathroom Shower/Tub: Multimedia programmer: Standard Home Equipment: None  Functional History: Prior Function Level of Independence: Independent Functional Status:  Mobility: Bed Mobility Overal bed mobility: Needs Assistance Bed Mobility: Supine to Sit Supine to sit: Mod  assist, HOB elevated General bed mobility comments: moderate assist to elevate trunk to upright and rotate hips to EOB Transfers Overall transfer level: Needs assistance Equipment used: Rolling walker (2 wheeled) Transfers: Sit to/from Stand, [employer] Transfers Sit to Stand: Mod assist, +2 physical assistance, +2 safety/equipment Stand pivot transfers: Mod assist, +2 physical assistance, +2 safety/equipment General transfer comment: multimodal cues for hand placement and power up to standing. Decreased ability to maintain upright, cues for facilitation of upright posture.   ADL: ADL Overall ADL's : Needs assistance/impaired Eating/Feeding: Sitting, Set up Grooming: Minimal assistance, Sitting Upper Body Bathing: Sitting, Moderate assistance Lower Body Bathing: Total assistance, +2 for physical assistance, Sit to/from stand Upper Body Dressing : Sitting, Moderate assistance Lower Body Dressing: Total assistance, +2 for physical assistance, Sit to/from stand Toilet Transfer: +2 for physical assistance, Moderate assistance, Stand-pivot, RW Toilet Transfer Details (indicate cue type and reason): simulated to chair Toileting- Clothing Manipulation and Hygiene: Total assistance, +2 for physical assistance, Sit to/from stand Functional mobility during ADLs:  (not able to ambulate)  Cognition: Cognition Overall Cognitive Status: Impaired/Different from baseline Orientation Level: Oriented X4 Cognition Arousal/Alertness: Awake/alert Behavior During Therapy: Flat affect Overall Cognitive Status: Impaired/Different from baseline Area of Impairment: Safety/judgement, Awareness, Problem solving Safety/Judgement: Decreased awareness of safety, Decreased awareness of deficits Awareness: Intellectual Problem Solving: Slow processing, Requires verbal cues, Requires tactile cues General Comments: patient with poor insight throughout session, slow to respond at times despite cues.  Blood  pressure (!) 103/42, pulse (!) 110, temperature 99.3 F (37.4 C), resp. rate 18, height 5\' 5"  (1.651 m), weight 97.9 kg (215 lb 12.8 oz), SpO2 93 %. Physical Exam  Vitals reviewed. Constitutional: She is oriented to person, place, and time.  66 year old obese female  HENT:  Head: Normocephalic.  Eyes: EOM are normal.  Neck: Normal range of motion. Neck supple. No thyromegaly present.  Cardiovascular: Normal rate and regular rhythm.   Respiratory: Effort normal and breath sounds normal.  GI: Soft. Bowel sounds are normal.  Colostomy intact with open midline dressing in place  Neurological: She is alert and oriented to person, place, and time. No cranial nerve deficit.  UE 4/5 prox to distal. LE: 3/5 HF,  3/5 KE and 4-/5 ADF/PF.   Psychiatric:  Slow to arouse    Lab Results Last 24 Hours       Results for orders placed or performed during the hospital encounter of 01/26/17 (from the past 24 hour(s))  Glucose, capillary     Status: Abnormal   Collection Time: 02/01/17  8:09 AM  Result Value Ref Range   Glucose-Capillary 225 (H) 65 - 99 mg/dL   Comment 1 Capillary Specimen    Comment 2 Notify RN   Glucose, capillary     Status: Abnormal   Collection Time: 02/01/17 12:11 PM  Result Value Ref Range   Glucose-Capillary 306 (H) 65 - 99 mg/dL   Comment 1 Capillary Specimen    Comment 2 Notify RN   Glucose, capillary     Status: Abnormal   Collection Time: 02/01/17  4:27 PM  Result Value Ref Range   Glucose-Capillary 340 (H) 65 - 99 mg/dL   Comment 1 Capillary Specimen    Comment 2 Notify RN   Glucose, capillary     Status: Abnormal   Collection Time: 02/01/17  5:56 PM  Result Value Ref Range   Glucose-Capillary 374 (H) 65 - 99 mg/dL  Glucose, capillary     Status: Abnormal   Collection Time: 02/01/17 10:17 PM  Result Value Ref Range   Glucose-Capillary 419 (H) 65 - 99 mg/dL      Imaging Results (Last 48 hours)  Dg Chest Port 1 View  Result Date:  02/01/2017 CLINICAL DATA:  Shortness of breath. EXAM: PORTABLE CHEST 1 VIEW COMPARISON:  Radiographs of January 29, 2017. FINDINGS: Stable cardiomediastinal silhouette. Left-sided pacemaker is unchanged in position. No pneumothorax or pleural effusion is noted. No consolidative process is noted. Bony thorax is unremarkable. IMPRESSION: No acute cardiopulmonary abnormality seen. Electronically Signed   By: Marijo Conception, M.D.   On: 02/01/2017 07:12     Assessment/Plan: Diagnosis: debility after colitis/colectomy.multiple medical 1. Does the need for close, 24 hr/day medical supervision in concert with the patient's rehab needs make it unreasonable for this patient to be served in a less intensive setting? Yes 2. Co-Morbidities requiring supervision/potential complications: morbid obesity, ostomy, nutrition 3. Due to bladder management, bowel management, safety, skin/wound care, disease management, medication administration, pain management and patient education, does the patient require 24 hr/day rehab nursing? Yes 4. Does the patient require coordinated care of a physician, rehab nurse, PT (1-2 hrs/day, 5 days/week) and OT (1-2 hrs/day, 5 days/week) to address physical and functional deficits in the context of the above medical diagnosis(es)? Yes Addressing deficits in the following areas: balance, endurance, locomotion, strength, transferring, bowel/bladder control, bathing, dressing, feeding, grooming, toileting and psychosocial support 5. Can the patient actively participate in an intensive therapy program of at least 3 hrs of therapy per day at least 5 days per week? Yes 6. The potential for patient to make measurable gains while on inpatient rehab is excellent 7. Anticipated functional outcomes upon discharge from inpatient rehab are supervision and min assist  with PT, supervision and min assist with OT, n/a with SLP. 8. Estimated rehab length of stay to reach the above functional goals is: 12-17  days 9. Anticipated D/C setting: Home 10. Anticipated post D/C treatments: [place] therapy 11. Overall Rehab/Functional Prognosis: excellent  RECOMMENDATIONS: This patient's condition is appropriate for continued rehabilitative care in the following setting: CIR Patient has agreed to participate in recommended program. Yes Note that insurance prior authorization may be required for reimbursement for recommended care.  Comment: Rehab Admissions Coordinator to follow up.  Thanks,  Crystal Staggers, MD, Mellody Drown    Cathlyn Parsons., PA-C 02/02/2017    Revision History  Routing History

## 2017-02-06 NOTE — Consult Note (Addendum)
[facility] Nurse ostomy consultnote Surgical team following for assessment and plan of care to midline abd surgical wound.  Stoma type/location: Pt had colostomy surgery on 6/27 at another facility, according to the EMR.  Stomal assessment/size: Stoma is dark brown and dusky; 1 3/4 inches, above skin level, when visualized through pouch which is intact with good seal.  Current pouch was applied by the bedside nurse this am; she stated the skin surrounding was red with partial thickness wounds and patchy areas of red rash. Treatment options for peristomal skin: Foam dressings were applied to protect skin and promote healing where MARSI has occurred.  Output: Mod amt small amt brown semi-formed stool Ostomy pouching: 1pc.  Education provided: Pt is preparing to transfer to rehab today.  Will continue to follow for ostomy teaching sessions prior to discharge home. Supplies at the bedside for staff nurse use. Enrolled patient in [place] program: No  Julien Girt MSN, RN, CWOCN, [employer], CNS

## 2017-02-06 NOTE — Care Management Note (Signed)
Case Management Note  Patient Details  Name: Crystal [name] MRN: [id number] Date of Birth: 12-15-1950  Subjective/Objective:    Admitted with colitis,  PMH of chronic systolic CHF, chronic LBBB, BiV AICD,  angioedema / ACEI but tolerated ARB, CKD, DM 2, OSA/ CPAP, COPD on nightly oxygen 2 L/m, hypothyroid.               [id number] [place]  Action/Plan: Plan is to d/c to CIR today pending bed availability. CM to f/u with disposition needs.  Expected Discharge Date:    02/06/2017              Expected Discharge Plan:  [place]  In-House Referral:     Discharge planning Services  CM Consult  Post Acute Care Choice:    Choice offered to:     DME Arranged:    DME Agency:     HH Arranged:    HH Agency:     Status of Service:    If discussed at [employer], dates discussed:    Additional Comments:  Sharin Mons, RN 02/06/2017, 10:39 AM

## 2017-02-06 NOTE — Progress Notes (Signed)
Pt arrived to unit at 1614 with son and daughter at bedside. RN changed colostomy due to drainage/leakage. New foam dressings placed on reddened, irritated area around the colostomy with a new one piece bag in place. RN reviewed rehab process and safety plan with pt and family with verbal understanding. Pt and family would like further information regarding living will and advanced directive.  Family at bedside with call bell in reach. Pt has personal CPAP from home to use.

## 2017-02-06 NOTE — Progress Notes (Signed)
Patient ID: Crystal [name], female   DOB: 04-27-1951, 66 y.o.   MRN: [id number]  [facility] Surgery Progress Note     Subjective: CC- colitis Sitting up eating breakfast. No complaints. Denies any current abdominal pain. No recent n/v. Having good OP from colostomy. Working with PT to decide SNF vs CIR when medically stable for discharge.  Objective: Vital signs in last 24 hours: Temp:  [98.9 F (37.2 C)-100.3 F (37.9 C)] 99.6 F (37.6 C) (07/16 0521) Pulse Rate:  [93-100] 93 (07/16 0751) Resp:  [17-19] 18 (07/16 0521) BP: (118-134)/(47-66) 118/66 (07/16 0751) SpO2:  [93 %-98 %] 97 % (07/16 0521) Last BM Date: 02/06/17  Intake/Output from previous day: 07/15 0701 - 07/16 0700 In: 805 [P.O.:290; I.V.:150; Blood:365] Out: 2100 [Urine:1900; Stool:200] Intake/Output this shift: No intake/output data recorded.  PE: Gen: alert, NAD, pleasant HEENT: pupils equal, EOMs intact Card: RRR Pulm: Normal effort, clear to auscultation bilaterally, no wheezes Abd: Soft, non-tender, non-distended, bowel sounds present in all 4 quadrants, midline incision healing well with trace bloody drainage/no purulent drainage or surrounding erythema; RLQ ostomy with stool in bag Skin: warm and dry, no rashes  Psych: A&Ox3   Lab Results:   Recent Labs  02/05/17 2335 02/06/17 0528  WBC 10.1 8.8  HGB 8.3* 8.1*  HCT 26.4* 25.5*  PLT 316 324   BMET  Recent Labs  02/05/17 0505 02/06/17 0528  NA 135 137  K 4.5 4.4  CL 108 109  CO2 19* 20*  GLUCOSE 141* 103*  BUN 61* 41*  CREATININE 1.79* 1.54*  CALCIUM 8.7* 8.8*   PT/INR No results for input(s): LABPROT, INR in the last 72 hours. CMP     Component Value Date/Time   NA 137 02/06/2017 0528   K 4.4 02/06/2017 0528   CL 109 02/06/2017 0528   CO2 20 (L) 02/06/2017 0528   GLUCOSE 103 (H) 02/06/2017 0528   BUN 41 (H) 02/06/2017 0528   CREATININE 1.54 (H) 02/06/2017 0528   CALCIUM 8.8 (L) 02/06/2017 0528   PROT 7.9  02/01/2017 0426   ALBUMIN 3.7 02/01/2017 0426   AST 20 02/01/2017 0426   ALT 17 02/01/2017 0426   ALKPHOS 131 (H) 02/01/2017 0426   BILITOT 0.9 02/01/2017 0426   GFRNONAA 34 (L) 02/06/2017 0528   GFRAA 39 (L) 02/06/2017 0528   Lipase     Component Value Date/Time   LIPASE 37 01/26/2017 1400       Studies/Results: No results found.  Anti-infectives: Anti-infectives    Start     Dose/Rate Route Frequency Ordered Stop   02/06/17 1600  anidulafungin (ERAXIS) 100 mg in sodium chloride 0.9 % 100 mL IVPB     100 mg 78 mL/hr over 100 Minutes Intravenous Every 24 hours 02/05/17 1451     02/05/17 1600  anidulafungin (ERAXIS) 200 mg in sodium chloride 0.9 % 200 mL IVPB     200 mg 78 mL/hr over 200 Minutes Intravenous  Once 02/05/17 1451 02/05/17 2350   02/04/17 1430  cefTRIAXone (ROCEPHIN) 1 g in dextrose 5 % 50 mL IVPB  Status:  Discontinued     1 g 100 mL/hr over 30 Minutes Intravenous Every 24 hours 02/04/17 1335 02/05/17 1418   01/27/17 1000  metroNIDAZOLE (FLAGYL) IVPB 500 mg  Status:  Discontinued     500 mg 100 mL/hr over 60 Minutes Intravenous Every 8 hours 01/27/17 0925 01/28/17 1229   01/26/17 1600  vancomycin (VANCOCIN) IVPB 1000 mg/200 mL premix  Status:  Discontinued     1,000 mg 200 mL/hr over 60 Minutes Intravenous Every 24 hours 01/26/17 1456 01/27/17 1229   01/26/17 1600  piperacillin-tazobactam (ZOSYN) IVPB 3.375 g  Status:  Discontinued     3.375 g 12.5 mL/hr over 240 Minutes Intravenous Every 8 hours 01/26/17 1456 01/28/17 1204   01/26/17 1430  metroNIDAZOLE (FLAGYL) IVPB 500 mg  Status:  Discontinued     500 mg 100 mL/hr over 60 Minutes Intravenous Every 8 hours 01/26/17 1421 01/26/17 1427       Assessment/Plan 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 Matt Holmes, 01/18/17 [facility] - wound care: daily wet-to-dry dressing changes - Ostomy: WOC applied foam dressings to protect skin, stoma OK, having bowel function - tolerating soft  diet  Coffee ground emesis- last episode 7/12. GI evaluated and recommending medical mgmt with PPI and supportive care. Possible endoscopy is sxs persist.   ABL anemia- hgb 8.1, stable today. S/p 2unit pRBCs 7/13. Follow CBC.  Dysuria/Fungal UTI - Ucx showing only yeast, on eraxis Acute renal failure on CKD - Cr trending down 1.54 today from 1.79 Acute on chronic diastolic CHF Respiratory failure w/ hypoxemia in post-op setting- improved DM COPD HTN HLD OSA Hypothyroid  ID - eraxis 7/15>>, rocephin 7/14>>7/15, flagyl 7/5>>7/7, zosyn 7/5>>7/7, vancomycin 7/5>>7/6 FEN - soft diet VTE - SCDs, Discontinued subcutaneous heparin due to GI bleed  Dispo: CIR vs home. Continue soft diet and PT/OT.   LOS: 11 days    Jerrye Beavers , [facility] Surgery 02/06/2017, 8:04 AM Pager: [phone] Consults: [phone] Mon-Fri 7:00 am-4:30 pm Sat-Sun 7:00 am-11:30 am

## 2017-02-06 NOTE — Progress Notes (Signed)
Crystal Gong, RN Rehab Admission Coordinator Signed Physical Medicine and Rehabilitation  PMR Pre-admission Date of Service: 02/06/2017 2:11 PM  Related encounter: Admission (Discharged) from 01/26/2017 in [place]       [] Hide copied text PMR Admission Coordinator Pre-Admission Assessment  Patient: Crystal [name] is an 66 y.o., female MRN: [id number] DOB: 05-Jan-1951 Height: 5\' 5"  (165.1 cm) Weight: 99.8 kg (220 lb)                                                                                                                                                  Insurance Information HMO:     PPO:      PCP:      IPA:      80/20: yes     OTHER: no HMO PRIMARY: Medicare a and [name]      Policy#: [id number] [name]      Subscriber: pt Benefits:  Phone #: passport one online     Name: 02/03/2017 Eff. Date: 1 11/23/14 [name] 07/26/15     Deduct: $1340      Out of Pocket Max: none      Life Max: none CIR: 100%      SNF: 20 full days Outpatient: 80%     Co-Pay: 20% Home Health: 100%      Co-Pay: none DME: 80%     Co-Pay: 20% Providers: pt choice  SECONDARY: [employer] [place]      Policy#: [id number]      Subscriber: pt  Medicaid Application Date:       Case Manager:  Disability Application Date:       Case Worker:   Emergency Contact Information        Contact Information    Name Relation Home Work Mobile   Crystal [name] Daughter   [phone]   Crystal [name]  [phone]     Crystal [name]  [phone]     Crystal [name] Daughter [phone]       Current Medical History  Patient Admitting Diagnosis: debility after colitis/colectomy  History of Present Illness:HPI: Crystal [name] Hughesis a 66 y.o.right handed femalewith history of diastolic congestive heart failure, nonischemic cardiomyopathy with defibrillator, CKD stage III, diabetes mellitus, OSA with CPAP, hypertension.Patient presented to [facility] 01/18/2017 with severe right  lower quadrant pain. Per report CT showed distended colon presumed colitis. 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 Hospital course septic shock started on TPN. Patient required ongoing IV fluids started on pressors. . Repeat CT scan of the abdomen 01/21/2017 showed mild free abdominal and pelvic fluid without evidence of abscess. Patient was transferred to [facility] 01/26/2017 for ongoing care. Follow-up CT abdomen and pelvis 01/27/2017 showed subtotal colectomy right abdominal proximal  transverse colostomy. Small to moderate volume fluid in the peritoneal cavity.WOCconsulted with wet-to-dry dressings. Patient was extubated 01/29/2017 followed by critical care medicine. Blood cultures remain negative broad-spectrum antibiotics later discontinued. Acute blood loss anemia 8.1and monitored. Patient with episode of large black emesis 02/02/2017.Transfused 2 units packed red blood cell 02/03/2017.Diet was downgraded to clear liquids placed on PPI. Gastroenterology services consulted recommending conservative care.Leukocytosis 16,700 improved at 12,300to 8800. Creatinine improved from 2.96-1.54.Patient with fungal UTI initially placed on intravenous Rocephin. Patient originally had a Foley tube. Discussed with infectious disease placed on intravenous Antidulafunginand await plan of duration.Diflucan avoided due to history of long QT.Diet has slowly been advanced to mechanical soft.   Past Medical History      Past Medical History:  Diagnosis Date  . Anemia, iron deficiency   . Angioedema    felt to likely be due to ace inhibitors but says she has had this even off of medicines, appears to be tolerating ARBs chronically  . Automatic implantable cardiac defibrillator in situ    high RV threshold chronically, device was turned off in 2014   . Back pain   . CHF (congestive heart failure) ([place])   . Chronic renal insufficiency   . Diabetes mellitus, type 2 ([place])   . Gout   . Hyperlipidemia   . Hypertension   . Intervertebral disc degeneration   . LBBB (left bundle branch block)   . Nonischemic cardiomyopathy ([place])    s/p ICD in 2005. EF has since recovered  . Obesity   . Systemic hypertension   . Unspecified hypothyroidism     Family History  family history includes Arrhythmia in her mother; Cancer in her father; Diabetes Mellitus II in her mother and other; Heart disease in her father and other; Hypertension in her father and mother.  Prior Rehab/Hospitalizations:  Has the patient had major surgery during 100 days prior to admission? No  Current Medications   Current Facility-Administered Medications:  .  acetaminophen (TYLENOL) solution 650 mg, 650 mg, Oral, Q4H PRN, Hongalgi, Anand D, MD .  [COMPLETED] anidulafungin (ERAXIS) 200 mg in sodium chloride 0.9 % 200 mL IVPB, 200 mg, Intravenous, Once, Stopped at 02/05/17 2350 **FOLLOWED BY** anidulafungin (ERAXIS) 100 mg in sodium chloride 0.9 % 100 mL IVPB, 100 mg, Intravenous, Q24H, Crystal [name], Crystal [name] .  carvedilol (COREG) tablet 6.25 mg, 6.25 mg, Oral, BID WC, McQuaid, Douglas B, MD, 6.25 mg at 02/06/17 0807 .  febuxostat (ULORIC) tablet 40 mg, 40 mg, Oral, Daily, Hongalgi, Anand D, MD, 40 mg at 02/06/17 1037 .  feeding supplement (BOOST / RESOURCE BREEZE) liquid 1 Container, 1 Container, Oral, BID BM, Hongalgi, Crystal Dickinson, MD, 1 Container at 02/06/17 1037 .  feeding supplement (PRO-STAT SUGAR FREE 64) liquid 30 mL, 30 mL, Oral, Daily, Hongalgi, Anand D, MD, 30 mL at 02/06/17 1038 .  fentaNYL (SUBLIMAZE) injection 12.5-25 mcg, 12.5-25 mcg, Intravenous, Q2H PRN, Crystal Maffucci B, MD, 25 mcg at 02/05/17 2025 .  gabapentin (NEURONTIN) capsule 100 mg, 100 mg, Oral, q morning - 10a, Hongalgi, Anand D, MD, 100 mg at 02/06/17 1038 .  gabapentin (NEURONTIN)  capsule 400 mg, 400 mg, Oral, QHS, Hongalgi, Anand D, MD, 400 mg at 02/05/17 2209 .  insulin aspart (novoLOG) injection 0-15 Units, 0-15 Units, Subcutaneous, TID WC, Hongalgi, Crystal Dickinson, MD, 8 Units at 02/06/17 1233 .  insulin aspart (novoLOG) injection 0-5 Units, 0-5 Units, Subcutaneous, QHS, Modena Jansky, MD, 3 Units at 02/04/17 2154 .  insulin aspart (novoLOG)  injection 6 Units, 6 Units, Subcutaneous, TID WC, Hongalgi, Crystal Dickinson, MD, 6 Units at 02/06/17 1233 .  insulin glargine (LANTUS) injection 50 Units, 50 Units, Subcutaneous, BID, Modena Jansky, MD, 50 Units at 02/06/17 1038 .  levothyroxine (SYNTHROID, LEVOTHROID) tablet 88 mcg, 88 mcg, Oral, QAC breakfast, Raiford Noble [place], [place], 88 mcg at 02/06/17 6160 .  loratadine (CLARITIN) tablet 10 mg, 10 mg, Oral, Daily, Hongalgi, Anand D, MD, 10 mg at 02/06/17 1037 .  montelukast (SINGULAIR) tablet 10 mg, 10 mg, Oral, QHS, Sheikh, Omair [place], DO, 10 mg at 02/05/17 2210 .  multivitamin with minerals tablet 1 tablet, 1 tablet, Oral, Daily, Hongalgi, Crystal Dickinson, MD, 1 tablet at 02/06/17 1038 .  nystatin (MYCOSTATIN/NYSTOP) topical powder, , Topical, TID, Hongalgi, Anand D, MD .  omega-3 acid ethyl esters (LOVAZA) capsule 4 g, 4 g, Oral, Daily, Sheikh, Omair Latif, DO, 4 g at 02/06/17 1038 .  pantoprazole (PROTONIX) EC tablet 40 mg, 40 mg, Oral, Daily, Hongalgi, Anand D, MD, 40 mg at 02/06/17 1037 .  promethazine (PHENERGAN) tablet 12.5 mg, 12.5 mg, Oral, Q8H PRN, Vernell Leep D, MD, 12.5 mg at 02/02/17 0951 .  rosuvastatin (CRESTOR) tablet 10 mg, 10 mg, Oral, QHS, Sheikh, Omair [place], DO, 10 mg at 02/05/17 2210 .  sodium chloride flush (NS) 0.9 % injection 10-40 mL, 10-40 mL, Intracatheter, Q12H, Hongalgi, Anand D, MD, 10 mL at 02/03/17 0635 .  sodium chloride flush (NS) 0.9 % injection 10-40 mL, 10-40 mL, Intracatheter, PRN, Modena Jansky, MD, 10 mL at 02/03/17 0058  Patients Current Diet: DIET SOFT Room service appropriate? Yes; Fluid  consistency: Thin  Precautions / Restrictions Precautions Precautions: Fall Restrictions Weight Bearing Restrictions: No   Has the patient had 2 or more falls or a fall with injury in the past year?No  Prior Activity Level Community (5-7x/wk): independent and driving pta. Retired Music therapist / Paramedic Devices/Equipment: None Home Equipment: None  Prior Device Use: Indicate devices/aids used by the patient prior to current illness, exacerbation or injury? None of the above  Prior Functional Level Prior Function Level of Independence: Independent Comments: very active and independent  Self Care: Did the patient need help bathing, dressing, using the toilet or eating?  Independent  Indoor Mobility: Did the patient need assistance with walking from room to room (with or without device)? Independent  Stairs: Did the patient need assistance with internal or external stairs (with or without device)? Independent  Functional Cognition: Did the patient need help planning regular tasks such as shopping or remembering to take medications? Independent  Current Functional Level Cognition  Overall Cognitive Status: Within Functional Limits for tasks assessed Orientation Level: Oriented X4 Safety/Judgement: Decreased awareness of deficits General Comments: patient with poor insight throughout session, slow to respond at times despite cues.    Extremity Assessment (includes Sensation/Coordination)  Upper Extremity Assessment: Generalized weakness  Lower Extremity Assessment: Generalized weakness    ADLs  Overall ADL's : Needs assistance/impaired Eating/Feeding: Sitting, Set up Grooming: Minimal assistance, Sitting Upper Body Bathing: Sitting, Moderate assistance Lower Body Bathing: Total assistance, +2 for physical assistance, Sit to/from stand Upper Body Dressing : Sitting, Moderate assistance Lower Body Dressing: Total assistance, +2  for physical assistance, Sit to/from stand Toilet Transfer: +2 for physical assistance, Moderate assistance, Stand-pivot, RW Toilet Transfer Details (indicate cue type and reason): simulated to chair Toileting- Clothing Manipulation and Hygiene: Total assistance, +2 for physical assistance, Sit to/from stand Functional mobility during ADLs:  (  not able to ambulate)    Mobility  Overal bed mobility: Needs Assistance Bed Mobility: Supine to Sit Supine to sit: Mod assist, Max assist Sit to supine: Mod assist, +2 for safety/equipment General bed mobility comments: bridging with max assist otherwise mod to come up and forward via R elbow..  Cues for hand placement and sequencing    Transfers  Overall transfer level: Needs assistance Equipment used: Ambulation equipment used Transfer via Lift Equipment: Stedy Transfers: Sit to/from Stand, Risk manager Sit to Stand: Max assist, +2 physical assistance, Mod assist Stand pivot transfers: Mod assist, Max assist, +2 physical assistance General transfer comment: using RW pt transfered to the chair, with stability assist during pivot and help maneuvering the RW    Ambulation / Gait / Stairs / Wheelchair Mobility       Posture / Balance Dynamic Sitting Balance Sitting balance - Comments: with fatigue pt listed right, but able to hold EOB for short periods Balance Overall balance assessment: Needs assistance Sitting-balance support: Feet supported Sitting balance-Leahy Scale: Fair Sitting balance - Comments: with fatigue pt listed right, but able to hold EOB for short periods Postural control: Posterior lean, Right lateral lean Standing balance support: Bilateral upper extremity supported Standing balance-Leahy Scale: Poor Standing balance comment: pt sit to stand x3 into RW twice from bed height, once from stedy height.  pt attained full upright posture and shifted feet aroun    Special needs/care consideration BiPAP/CPAP CPAP  pta CPM  N/a Continuous Drip IV n/a Dialysis  N/a Life Vest  N/a Oxygen  N/a Special Bed  N/a Trach Size  N/a Wound Vac  N/a Skin  Tenna Child, RN Registered Nurse Addendum WOC  Consult Note Date of Service: 02/06/2017 1:47 PM      [] Hide copied text [place] Nurse ostomy consultnote Surgical team following for assessment and plan of care to midline abd surgical wound.  Stoma type/location: Pt had colostomy surgery on 6/27 at another facility, according to the EMR.  Stomal assessment/size: Stoma is dark brown and dusky; 1 3/4 inches, above skin level, when visualized through pouch which is intact with good seal. Current pouch was applied by the bedside nurse this am; she stated the skin surrounding was red with partial thickness wounds and patchy areasof red rash. Treatment options for peristomal skin: Foam dressings were applied to protect skin and promote healing where MARSI has occurred.  Output: Mod amt small amt brown semi-formed stool Ostomy pouching: 1pc.  Education provided: Pt is preparing to transfer to rehab today. Will continue to follow for ostomy teaching sessions prior to discharge home. Supplies at the [employer] nurse use. Enrolled patient in [place] program: No  Julien Girt MSN, RN, CWOCN, [place], CNS  02/06/2017    Revision History                        Blister and rash to abdomen, surgical incision bowel mgmt colostomy Diabetic mgmt  yes     Previous Home Environment Living Arrangements: Alone (lived alone in [place])  Lives With: Alone Available Help at Discharge: Family, Available 24 hours/day Type of Home: House Home Layout: Bed/bath upstairs, Full bath on main level, Two level Alternate Level Stairs-Rails: Can reach both Alternate Level Stairs-Number of Steps: 15 Home Access: Level entry Bathroom Shower/Tub: Multimedia programmer: Standard Bathroom Accessibility: Yes How  Accessible: Accessible via walker Home Care Services: No Additional Comments: this is daughters home  layout  Discharge Living Setting Plans for Discharge Living Setting: Lives with (comment) (to live with daughter in [place] at [name]) Type of Home at Discharge: House Discharge Home Layout: 1/2 bath on main level, Able to live on main level with bedroom/bathroom, Two level Alternate Level Stairs-Rails: Right, Left, Can reach both Alternate Level Stairs-Number of Steps: 15 steps Discharge Home Access: Level entry Discharge Bathroom Shower/Tub: Tub/shower unit, Curtain Discharge Bathroom Toilet: Standard Discharge Bathroom Accessibility: Yes How Accessible: Accessible via walker Does the patient have any problems obtaining your medications?: No  Social/Family/Support Systems Patient Roles: Parent (retired Therapist, sports) Sport and exercise psychologist Information: Zigmund Daniel, daughter Anticipated Caregiver: daughter and her family Anticipated Caregiver's Contact Information: [phone] Ability/Limitations of Caregiver: daughter likely to take some FMLA, labtech Caregiver Availability: 24/7 Discharge Plan Discussed with Primary Caregiver: Yes Is Caregiver In Agreement with Plan?: Yes Does Caregiver/Family have Issues with Lodging/Transportation while Pt is in Rehab?: No   Goals/Additional Needs Patient/Family Goal for Rehab: supervision with PT and OT Expected length of stay: [facility]OS 10- 14 days; pt states 5 days Equipment Needs: new colostomy Pt/Family Agrees to Admission and willing to participate: Yes Program Orientation Provided & Reviewed with Pt/Caregiver Including Roles  & Responsibilities: Yes   Decrease burden of Care through IP rehab admission: n/a  Possible need for SNF placement upon discharge: not anticipated  Patient Condition: This patient's medical and functional status has changed since the consult dated: 02/02/2017 in which the Rehabilitation Physician determined and documented that the  patient's condition is appropriate for intensive rehabilitative care in an inpatient rehabilitation facility. See "History of Present Illness" (above) for medical update. Functional changes are: overall mod to max assist. Patient's medical and functional status update has been discussed with the Rehabilitation physician and patient remains appropriate for inpatient rehabilitation. Will admit to inpatient rehab today.  Preadmission Screen Completed By:  Cleatrice Burke, 02/06/2017 2:11 PM ______________________________________________________________________   Discussed status with Dr. Naaman Plummer on 02/06/2017 at  1421 and received telephone approval for admission today.  Admission Coordinator:  Cleatrice Burke, time 1610 Date 02/06/2017       Cosigned by: [name], MD at 02/06/2017 2:23 PM  Revision History

## 2017-02-07 ENCOUNTER — Inpatient Hospital Stay (HOSPITAL_COMMUNITY): Payer: Medicare Other | Admitting: Occupational Therapy

## 2017-02-07 ENCOUNTER — Inpatient Hospital Stay (HOSPITAL_COMMUNITY): Payer: Medicare Other

## 2017-02-07 LAB — COMPREHENSIVE METABOLIC PANEL
ALT: 37 U/L (ref 14–54)
AST: 27 U/L (ref 15–41)
Albumin: 3.2 g/dL — ABNORMAL LOW (ref 3.5–5.0)
Alkaline Phosphatase: 187 U/L — ABNORMAL HIGH (ref 38–126)
Anion gap: 7 (ref 5–15)
BILIRUBIN TOTAL: 0.6 mg/dL (ref 0.3–1.2)
BUN: 29 mg/dL — AB (ref 6–20)
CO2: 20 mmol/L — ABNORMAL LOW (ref 22–32)
CREATININE: 1.31 mg/dL — AB (ref 0.44–1.00)
Calcium: 9.3 mg/dL (ref 8.9–10.3)
Chloride: 109 mmol/L (ref 101–111)
GFR, EST AFRICAN AMERICAN: 48 mL/min — AB (ref >60)
GFR, EST NON AFRICAN AMERICAN: 41 mL/min — AB (ref >60)
Glucose, Bld: 296 mg/dL — ABNORMAL HIGH (ref 65–99)
Potassium: 5.3 mmol/L — ABNORMAL HIGH (ref 3.5–5.1)
Sodium: 136 mmol/L (ref 135–145)
TOTAL PROTEIN: 7 g/dL (ref 6.5–8.1)

## 2017-02-07 LAB — CBC WITH DIFFERENTIAL/PLATELET
BASOS ABS: 0 10*3/uL (ref 0.0–0.1)
Basophils Relative: 1 %
EOS PCT: 1 %
Eosinophils Absolute: 0.1 10*3/uL (ref 0.0–0.7)
HCT: 26.3 % — ABNORMAL LOW (ref 36.0–46.0)
Hemoglobin: 8.4 g/dL — ABNORMAL LOW (ref 12.0–15.0)
LYMPHS ABS: 1.3 10*3/uL (ref 0.7–4.0)
LYMPHS PCT: 16 %
MCH: 27.3 pg (ref 26.0–34.0)
MCHC: 31.9 g/dL (ref 30.0–36.0)
MCV: 85.4 fL (ref 78.0–100.0)
MONO ABS: 0.6 10*3/uL (ref 0.1–1.0)
Monocytes Relative: 8 %
NEUTROS ABS: 6.1 10*3/uL (ref 1.7–7.7)
Neutrophils Relative %: 74 %
PLATELETS: 326 10*3/uL (ref 150–400)
RBC: 3.08 MIL/uL — AB (ref 3.87–5.11)
RDW: 16.1 % — AB (ref 11.5–15.5)
WBC: 8.1 10*3/uL (ref 4.0–10.5)

## 2017-02-07 LAB — GLUCOSE, CAPILLARY
Glucose-Capillary: 261 mg/dL — ABNORMAL HIGH (ref 65–99)
Glucose-Capillary: 362 mg/dL — ABNORMAL HIGH (ref 65–99)
Glucose-Capillary: 261 mg/dL — ABNORMAL HIGH (ref 65–99)
Glucose-Capillary: 212 mg/dL — ABNORMAL HIGH (ref 65–99)

## 2017-02-07 MED ORDER — INSULIN GLARGINE 100 UNIT/ML ~~LOC~~ SOLN
52.0000 [IU] | Freq: Two times a day (BID) | SUBCUTANEOUS | Status: DC
  Administered 2017-02-07 – 2017-02-15 (×16): 52 [IU] via SUBCUTANEOUS
  Filled 2017-02-07 (×18): qty 0.52

## 2017-02-07 NOTE — Consult Note (Addendum)
[place] Nurse ostomy consultnote Surgical team following for assessment and plan of care to midline abd surgical wound.  Stoma type/location: Pt had colostomy surgery on 6/27 at another facility. Stomal assessment/size: Stoma previously had slough and eschar, which is beginning to loosen and fall off.  Stoma has also dropped to flush with skin level.  50% slough, 50% red, no further eschar.  Stoma is 2 inches, slightly oval, and flush with skin level.  Previous pouches have leaked twice during the past 24 hours, according to the EMR. Peristomal assessment: Previously noted partial thickness skin loss surrounding ostomy site related to [facility] has resolved.  Pt now has a red rash surrounding the stoma, appearance is consistent with candidiasis.  One piece pouch flexible convex 2 inch pouch applied with barrier ring to attempt to maintain a seal.  Belt added to secure. Applied antifungal powder to skin surrounding. Output: Mod amt small amt brown semi-formed stool Ostomy pouching: 1pc.  Education provided: Demonstrated pouch change to patient and she asked appropriate questions, no family members present. Pt participated in the procedure and was able to open and close velcro to empty.Supplies at the bedside for staff nurse use. Will continue teaching sessions while in rehab. Enrolled patient in [place] program: No  Julien Girt MSN, RN, CWOCN, [employer], CNS

## 2017-02-07 NOTE — Progress Notes (Signed)
Occupational Therapy Session Note  Patient Details  Name: SHATASHA LAMBING MRN: [id number] Date of Birth: 1951-05-12  Today's Date: 02/07/2017 OT Individual Time: 1330-1430 OT Individual Time Calculation (min): 60 min   Skilled Therapeutic Interventions/Progress Updates:    OT treatment session focused on functional transfers, activity tolerance, and UB strengthening. Pt transferred to sidelying, then able to advance LE's off of bed, min A to elevate trunk. Pt ambulated to bathroom w/ min A and RW. Transferred onto raised toilet with min A, voided bladder, and completed 3/3 toileting steps with min guard A for balance. Pt then ambulated to sink w/ RW and min A. Pt very fatigued, but pushed to stay standing to wash her hands. Pt required extended rest break s/p activity. Discussed home bathroom set-up, then completed tub shower transfer with min A. Pt ambulated from therapy apartment to therapy gym, then took rest break. Pt completed 10 mins on arm bike on level 2 without rest breaks. Pt then ambulated 1/2 way back to room prior to reaching max fatigue. Returned to room via wc, then transferred back to bed w/ RW and min A. Pt left semi-reclined with needs met.  Therapy Documentation Precautions:  Precautions Precautions: Fall, ICD/Pacemaker Precaution Comments: colostomy Restrictions Weight Bearing Restrictions: No See Function Navigator for Current Functional Status.   Therapy/Group: Individual Therapy  Valma Cava 02/07/2017, 2:11 PM

## 2017-02-07 NOTE — Progress Notes (Signed)
Subjective/Complaints:   Objective: Vital Signs: Blood pressure (!) 137/47, pulse 95, temperature 98.1 F (36.7 C), temperature source Oral, resp. rate 20, height _0  (1.651 m), weight 100.2 kg (221 lb), SpO2 100 %. No results found. Results for orders placed or performed during the hospital encounter of 02/06/17 (from the past 72 hour(s))  Glucose, capillary     Status: Abnormal   Collection Time: 02/06/17  5:33 PM  Result Value Ref Range   Glucose-Capillary 163 (H) 65 - 99 mg/dL   Comment 1 Notify RN   Glucose, capillary     Status: Abnormal   Collection Time: 02/06/17  8:52 PM  Result Value Ref Range   Glucose-Capillary 302 (H) 65 - 99 mg/dL  CBC WITH DIFFERENTIAL     Status: Abnormal   Collection Time: 02/07/17  6:22 AM  Result Value Ref Range   WBC 8.1 4.0 - 10.5 K/uL   RBC 3.08 (L) 3.87 - 5.11 MIL/uL   Hemoglobin 8.4 (L) 12.0 - 15.0 g/dL   HCT 26.3 (L) 36.0 - 46.0 %   MCV 85.4 78.0 - 100.0 fL   MCH 27.3 26.0 - 34.0 pg   MCHC 31.9 30.0 - 36.0 g/dL   RDW 16.1 (H) 11.5 - 15.5 %   Platelets 326 150 - 400 K/uL   Neutrophils Relative % 74 %   Neutro Abs 6.1 1.7 - 7.7 K/uL   Lymphocytes Relative 16 %   Lymphs Abs 1.3 0.7 - 4.0 K/uL   Monocytes Relative 8 %   Monocytes Absolute 0.6 0.1 - 1.0 K/uL   Eosinophils Relative 1 %   Eosinophils Absolute 0.1 0.0 - 0.7 K/uL   Basophils Relative 1 %   Basophils Absolute 0.0 0.0 - 0.1 K/uL  Comprehensive metabolic panel     Status: Abnormal   Collection Time: 02/07/17  6:22 AM  Result Value Ref Range   Sodium 136 135 - 145 mmol/L   Potassium 5.3 (H) 3.5 - 5.1 mmol/L    Comment: NO VISIBLE HEMOLYSIS   Chloride 109 101 - 111 mmol/L   CO2 20 (L) 22 - 32 mmol/L   Glucose, Bld 296 (H) 65 - 99 mg/dL   BUN 29 (H) 6 - 20 mg/dL   Creatinine, Ser 1.31 (H) 0.44 - 1.00 mg/dL   Calcium 9.3 8.9 - 10.3 mg/dL   Total Protein 7.0 6.5 - 8.1 g/dL   Albumin 3.2 (L) 3.5 - 5.0 g/dL   AST 27 15 - 41 U/L   ALT 37 14 - 54 U/L   Alkaline  Phosphatase 187 (H) 38 - 126 U/L   Total Bilirubin 0.6 0.3 - 1.2 mg/dL   GFR calc non Af Amer 41 (L) >60 mL/min   GFR calc Af Amer 48 (L) >60 mL/min    Comment: (NOTE) The eGFR has been calculated using the CKD EPI equation. This calculation has not been validated in all clinical situations. eGFR's persistently <60 mL/min signify possible Chronic Kidney Disease.    Anion gap 7 5 - 15  Glucose, capillary     Status: Abnormal   Collection Time: 02/07/17  6:43 AM  Result Value Ref Range   Glucose-Capillary 261 (H) 65 - 99 mg/dL     HEENT: normal Cardio: RRR and no murmur Resp: CTA B/L and unlabored GI: BS positive and NT, ND Extremity:  Pulses positive and No Edema Skin:   Other colostomy appliance Neuro: Alert/Oriented, Normal Sensory and Abnormal Motor 4+ BUE and BLE Musc/Skel:  Other no  pain with UE or LE ROM, no tenderness over great toe Gen NAD   Assessment/Plan: 1. Functional deficits secondary to deconditioning which require 3+ hours per day of interdisciplinary therapy in a comprehensive inpatient rehab setting. Physiatrist is providing close team supervision and 24 hour management of active medical problems listed below. Physiatrist and rehab team continue to assess barriers to discharge/monitor patient progress toward functional and medical goals. FIM:       Function - Toileting Toileting steps completed by patient: Performs perineal hygiene (wore a gown) Toileting Assistive Devices: Toilet aid Assist level: Touching or steadying assistance (Pt.75%)           Function - Comprehension Comprehension: Auditory Comprehension assist level: Follows complex conversation/direction with no assist  Function - Expression Expression: Verbal Expression assist level: Expresses complex ideas: With no assist  Function - Social Interaction Social Interaction assist level: Interacts appropriately with others - No medications needed.  Function - Problem Solving Problem  solving assist level: Solves complex problems: With extra time  Function - Memory Memory assist level: Complete Independence: No helper Patient normally able to recall (first 3 days only): Current season, Location of own room, Staff names and faces, That he or she is in a hospital  Medical Problem List and Plan:  1. Debilitated secondary to colitis/status post colectomy right abdominal proximal transverse colostomy 01/27/2017  CIR PT, OT evals 2. DVT Prophylaxis/Anticoagulation: SCDs. Monitor for any signs of DVT  3. Pain Management: Neurontin 100 mg every morning 400 mg daily at bedtime  4. Mood: Provide emotional support  5. Neuropsych: This patient is capable of making decisions on her own behalf.  6. Skin/Wound Care: Routine colostomy care  7. Fluids/Electrolytes/Nutrition: Routine I&O with follow-up chemistries  8.Hypertension. Coreg 6.25 mg twice a day  9. Diastolic congestive heart failure. Monitor for any signs of fluid overload  10. Cardiomyopathy with defibrillator. No chest pain or shortness of breath  11. CKD stage III. Follow-up chemistries upon admission  12. Diabetes mellitus and peripheral neuropathy. Lantus insulin 50 units twice a day,NovoLog 6 units 3 times a day. Check blood sugars before meals and at bedtime. Provide diabetic teaching  CBG (last 3)   Recent Labs  02/06/17 1733 02/06/17 2052 02/07/17 0643  GLUCAP 163* 302* 261*    -SSI for brief prednisone treatment  13. Acute on chronic anemia. Follow-up CBC. Transfuse 2 units packed red blood cell 02/03/2017 Follow-up GI as needed. Continue PPI  14. Fungal UTI. Presently on ERAXIS and will discuss plan of duration. Diflucan avoided due to history of long QT  15. OSA.CPAP. Singulair 10 mg daily  16. Gout. Uloric 40 mg daily. Acute flare of right toe---will give prednisone 5m po x 2 - CBG up last night,RIght great  toe is doing better 17. Hyperlipidemia. Lovaza/Crestor  18. Hypothyroidism. Synthroid   LOS  (Days) 1 A FACE TO FACE EVALUATION WAS PERFORMED  Deeann Servidio E 02/07/2017, 8:19 AM

## 2017-02-07 NOTE — Progress Notes (Signed)
Pt. Refused cpap for tonight. Pt. States she much rather not wear it tonight. RT informed pt. To notify if she changes her mind.

## 2017-02-07 NOTE — Progress Notes (Signed)
Social Work Assessment and Plan  Patient Details  Name: Crystal [name] MRN: [id number] Date of Birth: 11-22-1950  Today's Date: 02/07/2017  Problem List:  Patient Active Problem List   Diagnosis Date Noted  . Debilitated 02/06/2017  . S/P colostomy ([place]) 01/31/2017  . Pressure injury of skin 01/28/2017  . Colitis 01/26/2017  . Acute MI ([place]) 05/05/2014  . Anemia, iron deficiency 05/05/2014  . Airway hyperreactivity 05/05/2014  . Diabetes mellitus, type 2 ([place]) 05/05/2014  . Chronic systolic heart failure ([place]) 05/05/2014  . HLD (hyperlipidemia) 05/05/2014  . BP (high blood pressure) 05/05/2014  . Disease of thyroid gland 05/05/2014  . Nonischemic cardiomyopathy ([place]) 02/02/2014  . LBBB (left bundle branch block) 02/02/2014  . Chronic systolic dysfunction of left ventricle 02/02/2014  . Chronic renal insufficiency 02/02/2014  . Essential hypertension 02/02/2014  . Morbid obesity ([place]) 02/02/2014  . History of prolonged Q-T interval on ECG 10/31/2012  . Automatic implantable cardioverter-defibrillator in situ 08/06/2008   Past Medical History:  Past Medical History:  Diagnosis Date  . Anemia, iron deficiency   . Angioedema    felt to likely be due to ace inhibitors but says she has had this even off of medicines, appears to be tolerating ARBs chronically  . Automatic implantable cardiac defibrillator in situ    high RV threshold chronically, device was turned off in 2014  . Back pain   . CHF (congestive heart failure) ([place])   . Chronic renal insufficiency   . Diabetes mellitus, type 2 ([place])   . Gout   . Hyperlipidemia   . Hypertension   . Intervertebral disc degeneration   . LBBB (left bundle branch block)   . Nonischemic cardiomyopathy ([place])    s/p ICD in 2005. EF has since recovered  . Obesity   . Systemic hypertension   . Unspecified hypothyroidism    Past Surgical History:  Past Surgical History:  Procedure Laterality Date  . CARDIAC CATHETERIZATION  2005   no obstructive CAD per patient  . CARDIAC DEFIBRILLATOR PLACEMENT  2005   BiV ICD implanted,  LV lead is an epicardial lead  . CYST REMOVAL HAND Right 05/2013   thumb  . IMPLANTABLE CARDIOVERTER DEFIBRILLATOR GENERATOR CHANGE  2008  . TOTAL ABDOMINAL HYSTERECTOMY    . TRIGGER FINGER RELEASE  05/213  . VESICOVAGINAL FISTULA CLOSURE W/ TAH     Social History:  reports that she has never smoked. She has never used smokeless tobacco. She reports that she does not drink alcohol or use drugs.  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) Children: Crystal [name] - dtr - [phone] [facility] - d/c to this dtr's home); Crystal [name] - d[place] [phone] ([place]); Crystal [name] - son - [phone] [facility]); Crystal [name] - dtr - [phone] [facility]); Crystal [name] - dtr Anticipated Caregiver: daughter and her family Ability/Limitations of Caregiver: daughter likely to take some FMLA, labtech - per [facility] - CSW to confirm this with dtr Caregiver Availability: 24/7 Family Dynamics: supportive family per pt  Social History Preferred language: English Religion: Non-Denominational Education: nursing school Read: Yes Write: Yes Employment Status: Retired (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 Legal History/Current Legal Issues: none reported Guardian/Conservator: N/A - MD has determined that pt is capable of making her own decisions.   Abuse/Neglect Physical Abuse: Denies Verbal Abuse: Denies Sexual Abuse: Denies Exploitation  of patient/patient's resources: Denies Self-Neglect: Denies  Emotional Status Pt's affect, behavior and adjustment status: Pt was a little tearful at times during visit, as she thought about everything she's been through.  Pt is glad to have made it to this point, but still adjusting to the colostomy and moving to her dtr's home. Recent Psychosocial Issues: Pt was still working 2 days/week.  This helped pay for her insulin. Psychiatric History: none reported Substance Abuse History: none reported  Patient / Family Perceptions, Expectations & Goals Pt/Family understanding of illness & functional limitations: Pt has a good understanding of her condition and limitations and feels her questions have been answered. Premorbid pt/family roles/activities: Pt was working 2 days/week.  She watches some TV.  Used to like to read, but not so much anymore.  Attends church regularly. Anticipated changes in roles/activities/participation: Besides working, pt plans to resume the above activities as she is able. Pt/family expectations/goals: Pt wants to be able to do things for herself again.  Community Resources Express Scripts: None Premorbid Home Care/DME Agencies: None Transportation available at discharge: family Resource referrals recommended: Neuropsychology  Discharge Planning Living Arrangements: Alone, Children (Was alone PTA, but plans to reside with her dtr, Carissa at d/c.) Support Systems: Children, Social worker community, Friends/neighbors Type of Residence: Private residence Insurance underwriter Resources: [employer], Multimedia programmer (specify) Designer, jewellery as secondary) Museum/gallery curator Resources: Radio broadcast assistant Screen Referred: No Money Management: Patient Does the patient have any problems obtaining your medications?: No Home Management: Pt was taking care of the inside of her home and she had help with the outside upkeep. Patient/Family Preliminary Plans: Pt plans to go to her dtr's  [name]) home at d/c. Sw Barriers to Discharge: Other (comments) (new colostomy; Pt moving from [place] to [facility]) Social Work Anticipated Follow Up Needs: HH/OP Expected length of stay: [facility]OS 10- 14 days; pt states 5 days  Clinical Impression CSW met with pt to introduce self and role of CSW, as well as to complete assessment.  Pt was independent PTA and is struggling somewhat with the loss of her independence, everything she's gone through medically (new colostomy too), and with planning to d/c to her dtr's home instead of going to her own home.  Pt reports she is grateful she is making it through all of this and acknowledges that this is a process she just needs to work through.  She was tearful at times when talking about this, but states that her faith helps her get through difficult times, as does her "wonderful" family.  Pt plans to still go to her church in [place], as her dtr already drives there every Sunday.  Pt plans to switch to a PCP in [place].  CSW is available to assist with this and with Advanced Directives (pt mentioned this to the nurse), as needed.  CSW will also continue to follow pt and help transition pt to her dtr's home at d/c.  Kee Drudge, Silvestre Mesi 02/07/2017, 11:09 AM

## 2017-02-07 NOTE — Evaluation (Signed)
Occupational Therapy Assessment and Plan  Patient Details  Name: Crystal [name] MRN: [id number] Date of Birth: [birth year]  OT Diagnosis: acute pain and muscle weakness (generalized) Rehab Potential: Rehab Potential (ACUTE ONLY): Good ELOS: ~7-10 days   Today's Date: 02/07/2017 OT Individual Time: 1330-1430 OT Individual Time Calculation (min): 60 min     Problem List:  Patient Active Problem List   Diagnosis Date Noted  . Debilitated 02/06/2017  . S/P colostomy ([place]) 01/31/2017  . Pressure injury of skin 01/28/2017  . Colitis 01/26/2017  . Acute MI ([place]) 05/05/2014  . Anemia, iron deficiency 05/05/2014  . Airway hyperreactivity 05/05/2014  . Diabetes mellitus, type 2 ([place]) 05/05/2014  . Chronic systolic heart failure ([place]) 05/05/2014  . HLD (hyperlipidemia) 05/05/2014  . BP (high blood pressure) 05/05/2014  . Disease of thyroid gland 05/05/2014  . Nonischemic cardiomyopathy ([place]) 02/02/2014  . LBBB (left bundle branch block) 02/02/2014  . Chronic systolic dysfunction of left ventricle 02/02/2014  . Chronic renal insufficiency 02/02/2014  . Essential hypertension 02/02/2014  . Morbid obesity ([place]) 02/02/2014  . History of prolonged Q-T interval on ECG 10/31/2012  . Automatic implantable cardioverter-defibrillator in situ 08/06/2008    Past Medical History:  Past Medical History:  Diagnosis Date  . Anemia, iron deficiency   . Angioedema    felt to likely be due to ace inhibitors but says she has had this even off of medicines, appears to be tolerating ARBs chronically  . Automatic implantable cardiac defibrillator in situ    high RV threshold chronically, device was turned off in 2014  . Back pain   . CHF (congestive heart failure) ([place])   . Chronic renal insufficiency   . Diabetes mellitus, type 2 ([place])   . Gout   . Hyperlipidemia   . Hypertension   . Intervertebral disc degeneration   . LBBB (left bundle branch block)   . Nonischemic cardiomyopathy ([place])    s/p  ICD in 2005. EF has since recovered  . Obesity   . Systemic hypertension   . Unspecified hypothyroidism    Past Surgical History:  Past Surgical History:  Procedure Laterality Date  . CARDIAC CATHETERIZATION  2005   no obstructive CAD per patient  . CARDIAC DEFIBRILLATOR PLACEMENT  2005   BiV ICD implanted,  LV lead is an epicardial lead  . CYST REMOVAL HAND Right 05/2013   thumb  . IMPLANTABLE CARDIOVERTER DEFIBRILLATOR GENERATOR CHANGE  2008  . TOTAL ABDOMINAL HYSTERECTOMY    . TRIGGER FINGER RELEASE  05/213  . VESICOVAGINAL FISTULA CLOSURE W/ TAH      Assessment & Plan Clinical Impression: Patient is a 66 y.o. year old female right handed female with history of diastolic congestive heart failure, nonischemic cardiomyopathy with defibrillator, CKD stage III, diabetes mellitus, OSA with CPAP, hypertension. Per chart review patient lives in [place]. Independent working part-time prior to admission. She plans to stay with her daughter in [place] on discharge. Daughter works during the day but family in the area to provide assistance as needed. Patient presented to [facility]/18/2017 with severe right lower quadrant pain. Per report CT showed distended colon presumed colitis. 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 Hospital course septic shock started on TPN. Patient required ongoing IV fluids started on pressors. . Repeat CT scan of the abdomen 01/21/2017 showed mild free abdominal and  pelvic fluid without evidence of abscess. Patient was transferred to [facility] 01/26/2017 for ongoing care. Follow-up CT abdomen and pelvis 01/27/2017 showed subtotal colectomy right abdominal proximal transverse colostomy. Small to moderate volume fluid in the peritoneal cavity.WOC consulted with wet-to-dry dressings.  Patient was extubated 01/29/2017 followed by critical care medicine. Blood cultures remain negative broad-spectrum antibiotics later discontinued. Acute blood loss anemia 8.1 and monitored. Patient with episode of large black emesis 02/02/2017. Transfused 2 units packed red blood cell 02/03/2017. Diet was downgraded to clear liquids placed on PPI. Gastroenterology services consulted recommending conservative care. Leukocytosis 16,700 improved at 12,300 to 8800. Creatinine improved from 2.96-1.54. Patient with fungal UTI initially placed on intravenous Rocephin. Patient originally had a Foley tube. Discussed with infectious disease placed on intravenous Anidulafungin and await plan of duration. Diflucan avoided due to history of long QT. Diet has slowly been advanced to mechanical soft.   Patient transferred to CIR on 02/06/2017 .    Patient currently requires mod with basic self-care skills and min A functional mobility  secondary to muscle weakness, decreased cardiorespiratoy endurance and decreased sitting balance, decreased standing balance and decreased balance strategies.  Prior to hospitalization, patient could complete ADL with independent .  Patient will benefit from skilled intervention to decrease level of assist with basic self-care skills and increase independence with basic self-care skills prior to discharge home with care partner.  Anticipate patient will require intermittent supervision and follow up home health.  OT - End of Session Endurance Deficit: Yes OT Assessment Rehab Potential (ACUTE ONLY): Good OT Barriers to Discharge: Wound Care OT Patient demonstrates impairments in the following area(s): Safety;Sensory;Pain;Nutrition;Endurance;Motor OT Basic ADL's Functional Problem(s): Grooming;Bathing;Dressing;Toileting OT Transfers Functional Problem(s): Toilet;Tub/Shower OT Additional Impairment(s): None OT Plan OT Intensity: Minimum of 1-2 x/day, 45 to 90 minutes OT Frequency: 5 out  of 7 days OT Duration/Estimated Length of Stay: ~7-10 days OT Treatment/Interventions: Balance/vestibular training;Discharge planning;Pain management;Self Care/advanced ADL retraining;UE/LE Coordination activities;Therapeutic Activities;Therapeutic Exercise;Skin care/wound managment;Patient/family education;Functional mobility training;Disease mangement/prevention;Community reintegration;DME/adaptive equipment instruction;Neuromuscular re-education;Psychosocial support;UE/LE Strength taining/ROM OT Self Feeding Anticipated Outcome(s): n/a OT Basic Self-Care Anticipated Outcome(s): mod I  OT Toileting Anticipated Outcome(s): mod I  OT Bathroom Transfers Anticipated Outcome(s): mod I  OT Recommendation Recommendations for Other Services: Neuropsych consult Patient destination: Home Follow Up Recommendations: Home health OT Equipment Recommended: To be determined   Skilled Therapeutic Intervention 1:1 OT eval initiated with OT purpose, role and goals, Self care retraining including bathing at EOB with focus on sit to stands, functional mobility (transfers and ambulation), standing balance, etc. Pt reported being anxious about being able to follow through in our program but pt did well. Pt required mod A for initial sit to stands but able to progress to min A. Pt required min A for transfers on and off BSC with RW. Discussed working towards management of ostomy during the day.  Left pt resting in recliner with call bell.     OT Evaluation Precautions/Restrictions  Precautions Precautions: Fall;ICD/Pacemaker Precaution Comments: colostomy Restrictions Weight Bearing Restrictions: No General Chart Reviewed: Yes Family/Caregiver Present: No Vital Signs Therapy Vitals Temp: 98.8 F (37.1 C) Temp Source: Oral Pulse Rate: 100 Resp: 18 BP: (!) 138/54 Patient Position (if appropriate): Lying Oxygen Therapy SpO2: 100 % O2 Device: Not Delivered Pain   Home Living/Prior  Functioning Home Living Living Arrangements: Alone, Children (Was alone PTA, but plans to reside with her dtr, Carissa at d/c.) Available Help at Discharge: Family, Available 24 hours/day Type of Home: House Home  Access: Level entry Home Layout: Bed/bath upstairs, Full bath on main level, Two level Alternate Level Stairs-Number of Steps: flight Alternate Level Stairs-Rails: Can reach both Bathroom Shower/Tub: Multimedia programmer: Standard Additional Comments: information about daughter's home to which is she is d/c to  Lives With: Alone Prior Function Level of Independence: Independent with basic ADLs, Independent with gait, Independent with transfers, Independent with homemaking with ambulation  Able to Take Stairs?: Yes 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 ADL ADL ADL Comments: see Financial trader Baseline Vision/History: No visual deficits Patient Visual Report: No change from baseline Vision Assessment?: No apparent visual deficits Perception  Perception: Within Functional Limits Praxis Praxis: Intact Cognition Overall Cognitive Status: Within Functional Limits for tasks assessed Arousal/Alertness: Awake/alert Orientation Level: Person;Place;Situation Person: Oriented Place: Oriented Situation: Oriented Year: 2018 Month: July Day of Week: Correct Memory: Appears intact Immediate Memory Recall: Sock;Blue;Bed Memory Recall: Blue;Bed (2/3) Memory Recall Blue: With Cue Memory Recall Bed: With Cue Attention: Selective;Alternating Selective Attention: Appears intact Awareness: Appears intact Problem Solving: Appears intact Safety/Judgment: Appears intact Sensation Sensation Light Touch: Appears Intact Stereognosis: Appears Intact Hot/Cold: Appears Intact Proprioception: Appears Intact Coordination Gross Motor Movements are Fluid and Coordinated: Yes Fine Motor Movements are Fluid and  Coordinated: Yes Motor  Motor Motor: Other (comment) Motor - Skilled Clinical Observations: generalized weakness Mobility  Transfers Transfers: Sit to Stand;Stand to Sit Sit to Stand: 3: Mod assist Stand to Sit: 4: Min guard  Trunk/Postural Assessment  Cervical Assessment Cervical Assessment: Within Functional Limits Thoracic Assessment Thoracic Assessment:  (kyphotic posture) Lumbar Assessment Lumbar Assessment:  (posterior pelvic ) Postural Control Postural Control: Within Functional Limits  Balance Static Sitting Balance Static Sitting - Level of Assistance: 6: Modified independent (Device/Increase time) Dynamic Sitting Balance Dynamic Sitting - Level of Assistance: 5: Stand by assistance Static Standing Balance Static Standing - Level of Assistance: 4: Min assist Dynamic Standing Balance Dynamic Standing - Level of Assistance: 4: Min assist Extremity/Trunk Assessment RUE Assessment RUE Assessment: Within Functional Limits LUE Assessment LUE Assessment: Within Functional Limits   See Function Navigator for Current Functional Status.   Refer to Care Plan for Long Term Goals  Recommendations for other services: None    Discharge Criteria: Patient will be discharged from OT if patient refuses treatment 3 consecutive times without medical reason, if treatment goals not met, if there is a change in medical status, if patient makes no progress towards goals or if patient is discharged from hospital.  The above assessment, treatment plan, treatment alternatives and goals were discussed and mutually agreed upon: by patient  Nicoletta Ba 02/07/2017, 2:34 PM

## 2017-02-07 NOTE — Progress Notes (Signed)
[facility] Individual Statement of Services  Patient Name:  Crystal [name]  Date:  02/07/2017  Welcome to the [facility].  Our goal is to provide you with an individualized program based on your diagnosis and situation, designed to meet your specific needs.  With this comprehensive rehabilitation program, you will be expected to participate in at least 3 hours of rehabilitation therapies Monday-Friday, with modified therapy programming on the weekends.  Your rehabilitation program will include the following services:  Physical Therapy (PT), Occupational Therapy (OT), 24 hour per day rehabilitation nursing, Neuropsychology, Case Management (Social Worker), Rehabilitation Medicine, Nutrition Services and Pharmacy Services  Weekly team conferences will be held on Wednesdays to discuss your progress.  Your Social Worker will talk with you frequently to get your input and to update you on team discussions.  Team conferences with you and your family in attendance may also be held.  Expected length of stay:  7 to 10 days  Overall anticipated outcome:  Modified Independent and Supervision for car transfers, community ambulation, and stairs  Depending on your progress and recovery, your program may change. Your Social Worker will coordinate services and will keep you informed of any changes. Your Social Worker's name and contact numbers are listed  below.  The following services may also be recommended but are not provided by the [facility] will be made to provide these services after discharge if needed.  Arrangements include referral to agencies that provide these services.  Your insurance has been verified to be:  Medicare and [employer] Your primary doctor is:  Dannial Monarch, [place] - It sounds  like your plan is to get established with a primary doctor in [place].  Please let Sonia Baller know if you need help finding someone.  Pertinent information will be shared with your doctor and your insurance company.  Social Worker:  Alfonse Alpers, LCSW  [phone] or [phone]  Information discussed with and copy given to patient by: Trey Sailors, 02/07/2017, 10:36 AM

## 2017-02-07 NOTE — Evaluation (Signed)
Physical Therapy Assessment and Plan  Patient Details  Name: Crystal [name] MRN: [id number] Date of Birth: 21-Aug-1950  PT Diagnosis: Difficulty walking, Low back pain, Muscle weakness and Pain in joint Rehab Potential: Good ELOS: 7-10 days   Today's Date: 02/07/2017 PT Individual Time: 1045-1200 PT Individual Time Calculation (min): 75 min    Problem List:  Patient Active Problem List   Diagnosis Date Noted  . Debilitated 02/06/2017  . S/P colostomy ([place]) 01/31/2017  . Pressure injury of skin 01/28/2017  . Colitis 01/26/2017  . Acute MI ([place]) 05/05/2014  . Anemia, iron deficiency 05/05/2014  . Airway hyperreactivity 05/05/2014  . Diabetes mellitus, type 2 ([place]) 05/05/2014  . Chronic systolic heart failure ([place]) 05/05/2014  . HLD (hyperlipidemia) 05/05/2014  . BP (high blood pressure) 05/05/2014  . Disease of thyroid gland 05/05/2014  . Nonischemic cardiomyopathy ([place]) 02/02/2014  . LBBB (left bundle branch block) 02/02/2014  . Chronic systolic dysfunction of left ventricle 02/02/2014  . Chronic renal insufficiency 02/02/2014  . Essential hypertension 02/02/2014  . Morbid obesity ([place]) 02/02/2014  . History of prolonged Q-T interval on ECG 10/31/2012  . Automatic implantable cardioverter-defibrillator in situ 08/06/2008    Past Medical History:  Past Medical History:  Diagnosis Date  . Anemia, iron deficiency   . Angioedema    felt to likely be due to ace inhibitors but says she has had this even off of medicines, appears to be tolerating ARBs chronically  . Automatic implantable cardiac defibrillator in situ    high RV threshold chronically, device was turned off in 2014  . Back pain   . CHF (congestive heart failure) ([place])   . Chronic renal insufficiency   . Diabetes mellitus, type 2 ([place])   . Gout   . Hyperlipidemia   . Hypertension   . Intervertebral disc degeneration   . LBBB (left bundle branch block)   . Nonischemic cardiomyopathy ([place])    s/p ICD in 2005.  EF has since recovered  . Obesity   . Systemic hypertension   . Unspecified hypothyroidism    Past Surgical History:  Past Surgical History:  Procedure Laterality Date  . CARDIAC CATHETERIZATION  2005   no obstructive CAD per patient  . CARDIAC DEFIBRILLATOR PLACEMENT  2005   BiV ICD implanted,  LV lead is an epicardial lead  . CYST REMOVAL HAND Right 05/2013   thumb  . IMPLANTABLE CARDIOVERTER DEFIBRILLATOR GENERATOR CHANGE  2008  . TOTAL ABDOMINAL HYSTERECTOMY    . TRIGGER FINGER RELEASE  05/213  . VESICOVAGINAL FISTULA CLOSURE W/ TAH      Assessment & Plan Clinical Impression: Patient is a 66 y.o. year old right handed female with history of diastolic congestive heart failure, nonischemic cardiomyopathy with defibrillator, CKD stage III, diabetes mellitus, OSA with CPAP, hypertension. Per chart review patient lives in [place]. Independent working part-time prior to admission. She plans to stay with her daughter in [place] on discharge. Daughter works during the day but family in the area to provide assistance as needed. Patient presented to [facility]/18/2017 with severe right lower quadrant pain. Per report CT showed distended colon presumed colitis. 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 Hospital course septic shock started on TPN. Patient required ongoing IV fluids started on pressors. . Repeat CT scan of the abdomen 01/21/2017 showed mild free abdominal and pelvic  fluid without evidence of abscess. Patient was transferred to [facility] 01/26/2017 for ongoing care. Follow-up CT abdomen and pelvis 01/27/2017 showed subtotal colectomy right abdominal proximal transverse colostomy. Small to moderate volume fluid in the peritoneal cavity.WOC consulted with wet-to-dry dressings. Patient was extubated  01/29/2017 followed by critical care medicine. Blood cultures remain negative broad-spectrum antibiotics later discontinued. Acute blood loss anemia 8.1 and monitored. Patient with episode of large black emesis 02/02/2017. Transfused 2 units packed red blood cell 02/03/2017. Diet was downgraded to clear liquids placed on PPI. Gastroenterology services consulted recommending conservative care. Leukocytosis 16,700 improved at 12,300 to 8800. Creatinine improved from 2.96-1.54. Patient with fungal UTI initially placed on intravenous Rocephin. Patient originally had a Foley tube. Discussed with infectious disease placed on intravenous Anidulafungin and await plan of duration. Diflucan avoided due to history of long QT. Diet has slowly been advanced to mechanical soft. Physical and occupational therapy evaluations completed 02/01/2017 with recommendations of physical medicine rehabilitation consult.  Patient transferred to CIR on 02/06/2017 .   Patient currently requires min to mod with mobility secondary to muscle weakness and muscle joint tightness, decreased cardiorespiratoy endurance and decreased sitting balance, decreased standing balance and decreased balance strategies.  Prior to hospitalization, patient was independent  with mobility and lived with Alone in a House home.  Home access is  Level entry.  Patient will benefit from skilled PT intervention to maximize safe functional mobility, minimize fall risk and decrease caregiver burden for planned discharge home with 24 hour supervision.  Anticipate patient will benefit from follow up [place] at discharge.  PT - End of Session Activity Tolerance: Decreased this session Endurance Deficit: Yes PT Assessment Rehab Potential (ACUTE/IP ONLY): Good PT Patient demonstrates impairments in the following area(s): Balance;Endurance;Motor;Pain PT Transfers Functional Problem(s): Bed Mobility;Bed to Chair;Car;Furniture PT Locomotion Functional Problem(s):  Ambulation;Stairs PT Plan PT Intensity: Minimum of 1-2 x/day ,45 to 90 minutes PT Frequency: 5 out of 7 days PT Duration Estimated Length of Stay: 7-10 days PT Treatment/Interventions: Ambulation/gait training;Balance/vestibular training;Community reintegration;Discharge planning;Disease management/prevention;DME/adaptive equipment instruction;Functional mobility training;Neuromuscular re-education;Pain management;Patient/family education;Psychosocial support;Skin care/wound management;Stair training;Therapeutic Activities;Therapeutic Exercise;UE/LE Strength taining/ROM;Wheelchair propulsion/positioning;UE/LE Coordination activities PT Transfers Anticipated Outcome(s): mod I basic transfers; supervision car PT Locomotion Anticipated Outcome(s): mod I household gait; supervision community and stairs PT Recommendation Recommendations for Other Services: Therapeutic Recreation consult Therapeutic Recreation Interventions: Outing/community reintergration;Kitchen group Follow Up Recommendations: Home health PT Patient destination: Home Equipment Recommended: Rolling walker with 5" wheels  Skilled Therapeutic Intervention Evaluation completed (see details above and below) with education on PT POC and goals and individual treatment initiated with focus on functional transfers with cues for efficient technique and hand placement, gait with RW with focus on increasing endurance and strength, simulated car transfer to prepare for d/c with mod assist for sit -> stand out of car due to decreased use of UE support, stair negotiation to prepare for second floor access and general strengthening x 4 steps with bilateral rails and min for balance and eccentric control, bed mobility on flat surface with cues for technique for supine to sit to decrease pressure through abdomen and protect low back (mod assist needed to come up to sitting), and Nustep for general strengthening and endurance x 10 min on level 4. Pt  tearful at times through session expressing concern and worry about her eventual return to home and if that was an option. Emotional support provided throughout and encouragement.    PT Evaluation Precautions/Restrictions Precautions Precautions: Fall;ICD/Pacemaker Precaution Comments: colostomy Restrictions  Weight Bearing Restrictions: No Pain  Reports chronic low back pain - rest breaks as needed. Home Living/Prior Functioning Home Living Living Arrangements: Alone;Children (Was alone PTA, but plans to reside with her dtr, Carissa at d/c.) Available Help at Discharge: Family;Available 24 hours/day Type of Home: House Home Access: Level entry Home Layout: Bed/bath upstairs;Full bath on main level;Two level Alternate Level Stairs-Number of Steps: flight Additional Comments: information about daughter's home to which is she is d/c to  Lives With: Alone Prior Function Level of Independence: Independent with basic ADLs;Independent with gait;Independent with transfers;Independent with homemaking with ambulation  Able to Take Stairs?: Yes 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[employer] Overall Cognitive Status: Within Advertising copywriter for tasks assessed Sensation Sensation Light Touch: Appears Intact Proprioception: Appears Intact Coordination Gross Motor Movements are Fluid and Coordinated: Yes Motor  Motor Motor: Other (comment) Motor - Skilled Clinical Observations: generalized weakness     Trunk/Postural Assessment  Cervical Assessment Cervical Assessment: Within Functional Limits Thoracic Assessment Thoracic Assessment:  (kyphotic posture) Lumbar Assessment Lumbar Assessment:  (posterior pelvic tilt; h/o back paoin)  Balance Static Sitting Balance Static Sitting - Level of Assistance: 6: Modified independent (Device/Increase time) Dynamic Sitting Balance Dynamic Sitting - Level of Assistance: 5: Stand by  assistance Static Standing Balance Static Standing - Level of Assistance: 4: Min assist Dynamic Standing Balance Dynamic Standing - Level of Assistance: 4: Min assist Extremity Assessment     see OT eval for UE assessment RLE Assessment RLE Assessment: Exceptions to [facility] (grossly 4/5; decreased muscular endurance) LLE Assessment LLE Assessment: Exceptions to [facility] (grossly 4/5; decreased muscular endurance)   See Function Navigator for Current Functional Status.   Refer to Care Plan for Long Term Goals  Recommendations for other services: Therapeutic Recreation  Kitchen group and Outing/community reintegration  Discharge Criteria: Patient will be discharged from PT if patient refuses treatment 3 consecutive times without medical reason, if treatment goals not met, if there is a change in medical status, if patient makes no progress towards goals or if patient is discharged from hospital.  The above assessment, treatment plan, treatment alternatives and goals were discussed and mutually agreed upon: by patient  Juanna Cao, PT, DPT  02/07/2017, 12:12 PM

## 2017-02-08 ENCOUNTER — Inpatient Hospital Stay (HOSPITAL_COMMUNITY): Payer: Medicare Other | Admitting: Occupational Therapy

## 2017-02-08 ENCOUNTER — Inpatient Hospital Stay (HOSPITAL_COMMUNITY): Payer: Medicare Other | Admitting: Physical Therapy

## 2017-02-08 LAB — GLUCOSE, CAPILLARY
GLUCOSE-CAPILLARY: 196 mg/dL — AB (ref 65–99)
GLUCOSE-CAPILLARY: 137 mg/dL — AB (ref 65–99)
Glucose-Capillary: 112 mg/dL — ABNORMAL HIGH (ref 65–99)
Glucose-Capillary: 101 mg/dL — ABNORMAL HIGH (ref 65–99)

## 2017-02-08 MED ORDER — TROLAMINE SALICYLATE 10 % EX CREA
TOPICAL_CREAM | Freq: Two times a day (BID) | CUTANEOUS | Status: DC | PRN
  Filled 2017-02-08: qty 85

## 2017-02-08 MED ORDER — MUSCLE RUB 10-15 % EX CREA
TOPICAL_CREAM | Freq: Two times a day (BID) | CUTANEOUS | Status: DC | PRN
  Filled 2017-02-08: qty 85

## 2017-02-08 NOTE — Progress Notes (Signed)
Patient rested well throughout the night.  No needs/concerns voiced at this time.  Patient safety maintained.

## 2017-02-08 NOTE — Progress Notes (Signed)
Social Work Patient ID: Dani Gobble, female   DOB: 04/27/1951, 66 y.o.   MRN: [id number]   Loranda Mastel, Gerline Legacy, LCSW Social Worker Signed   Patient Care Conference Date of Service: 02/08/2017  1:08 PM      Hide copied text Hover for attribution information Inpatient RehabilitationTeam Conference and Plan of Care Update Date: 02/08/2017   Time: 11:20 AM      Patient Name: Crystal [name]      Medical Record Number: [id number]  Date of Birth: [birth year] Sex: Female         Room/Bed: 4M09C/4M09C-01 Payor Info: Payor: MEDICARE / Plan: MEDICARE PART A AND B / Product Type: *No Product type* /     Admitting Diagnosis: Debility  Admit Date/Time:  02/06/2017  4:20 PM Admission Comments: No comment available    Primary Diagnosis:  Debilitated Principal Problem: Debilitated       Patient Active Problem List    Diagnosis Date Noted  . Debilitated 02/06/2017  . S/P colostomy ([place]) 01/31/2017  . Pressure injury of skin 01/28/2017  . Colitis 01/26/2017  . Acute MI ([place]) 05/05/2014  . Anemia, iron deficiency 05/05/2014  . Airway hyperreactivity 05/05/2014  . Diabetes mellitus, type 2 ([place]) 05/05/2014  . Chronic systolic heart failure ([place]) 05/05/2014  . HLD (hyperlipidemia) 05/05/2014  . BP (high blood pressure) 05/05/2014  . Disease of thyroid gland 05/05/2014  . Nonischemic cardiomyopathy ([place]) 02/02/2014  . LBBB (left bundle branch block) 02/02/2014  . Chronic systolic dysfunction of left ventricle 02/02/2014  . Chronic renal insufficiency 02/02/2014  . Essential hypertension 02/02/2014  . Morbid obesity ([place]) 02/02/2014  . History of prolonged Q-T interval on ECG 10/31/2012  . Automatic implantable cardioverter-defibrillator in situ 08/06/2008      Expected Discharge Date: Expected Discharge Date: 02/15/17   Team Members Present: Physician leading conference: Dr. Alysia Penna Social Worker Present: Alfonse Alpers, LCSW Nurse Present: Other (comment) Blair Heys, RN) PT  Present: Dwyane Dee, PT OT Present: Cherylynn Ridges, OT SLP Present: Weston Anna, SLP PPS Coordinator present : Daiva Nakayama, RN, CRRN       Current Status/Progress Goal Weekly Team Focus  Medical     Has colostomy with continued issues with leakage, CBG, still elevated  Instructed patient on how to self manage colostomy, keep blood sugars between 110 and 180. During hospitalization  Initiate rehabilitation program   Bowel/Bladder     Patient continent of bladder, Colostomy noted RLQ w/ minimal output noted.   Patient to remain continent of bladder while on IPR  Continue to monitor B/B needs and address needs PRN   Swallow/Nutrition/ Hydration               ADL's     Min A overall  Mod I overall  Standing endurance, activity tolerance, ostomy care, UB/LB strengthening   Mobility     min assist overall  mod I overall for transfers and gait; supervision car and stairs and community mobility  endurance, functional strengthening, stairs, bed mobility, and gait/balance   Communication               Safety/Cognition/ Behavioral Observations             Pain     Patient denies pain at this time  Patient to have pain <3 while on IPR  Continue to monitor pain level and medicate as ordered.   Skin     Abdominal incision site w/ wet to dry dressing noted. Dressing c/d/i  Patient to maintain remaining skin integrity while on IPR  Continue to monitor skin integrity q shift and PRN     Rehab Goals Patient on target to meet rehab goals: Yes Rehab Goals Revised: none *See Care Plan and progress notes for long and short-term goals.      Barriers to Discharge   Current Status/Progress Possible Resolutions Date Resolved   Physician     Medical stability;Other (comments)  Poor endurance     Continue rehabilitation program.      Nursing   Wound Care             PT                    OT Wound Care               SLP            SW Other (comments) (new colostomy; Pt moving from [place]  to [facility])  RN is working with pt on colostomy care.  Dtr is preparing for pt to be at her home.            Discharge Planning/Teaching Needs:  Pt to d/c to her dtr, Carissa's home.  Dtr will take some time off with her mother.  [name] can come for family education next week, as needed.   Team Discussion:  Pt is deconditioned, but has no major medical issues.  MD will continue to monitor kidneys and my need to adjust medications.  He also recommended bengay and heating pad for pt's sore back.  RN talked about pt's pain in her back, chronic issue with bulging disc PTA.  RN is also working with pt on colostomy care, but RN is doing dressing.  IV antibiotic is completed.  Pt is at min A overall with low endurance requiring lots of rest breaks.  Lower surfaces cause pt to need mod A.  Therapists to work on increasing endurance and activity tolerance over the next week.  Revisions to Treatment Plan:  none    Continued Need for Acute Rehabilitation Level of Care: The patient requires daily medical management by a physician with specialized training in physical medicine and rehabilitation for the following conditions: Daily direction of a multidisciplinary physical rehabilitation program to ensure safe treatment while eliciting the highest outcome that is of practical value to the patient.: Yes Daily medical management of patient stability for increased activity during participation in an intensive rehabilitation regime.: Yes Daily analysis of laboratory values and/or radiology reports with any subsequent need for medication adjustment of medical intervention for : Neurological problems;Diabetes problems;Wound care problems   Porsha Skilton, Silvestre Mesi 02/08/2017, 1:08 PM

## 2017-02-08 NOTE — Patient Care Conference (Signed)
Inpatient RehabilitationTeam Conference and Plan of Care Update Date: 02/08/2017   Time: 11:20 AM    Patient Name: Crystal [name]      Medical Record Number: [id number]  Date of Birth: 1950-09-03 Sex: Female         Room/Bed: 4M09C/4M09C-01 Payor Info: Payor: MEDICARE / Plan: MEDICARE PART A AND B / Product Type: *No Product type* /    Admitting Diagnosis: Debility  Admit Date/Time:  02/06/2017  4:20 PM Admission Comments: No comment available   Primary Diagnosis:  Debilitated Principal Problem: Debilitated  Patient Active Problem List   Diagnosis Date Noted  . Debilitated 02/06/2017  . S/P colostomy ([place]) 01/31/2017  . Pressure injury of skin 01/28/2017  . Colitis 01/26/2017  . Acute MI ([place]) 05/05/2014  . Anemia, iron deficiency 05/05/2014  . Airway hyperreactivity 05/05/2014  . Diabetes mellitus, type 2 ([place]) 05/05/2014  . Chronic systolic heart failure ([place]) 05/05/2014  . HLD (hyperlipidemia) 05/05/2014  . BP (high blood pressure) 05/05/2014  . Disease of thyroid gland 05/05/2014  . Nonischemic cardiomyopathy ([place]) 02/02/2014  . LBBB (left bundle branch block) 02/02/2014  . Chronic systolic dysfunction of left ventricle 02/02/2014  . Chronic renal insufficiency 02/02/2014  . Essential hypertension 02/02/2014  . Morbid obesity ([place]) 02/02/2014  . History of prolonged Q-T interval on ECG 10/31/2012  . Automatic implantable cardioverter-defibrillator in situ 08/06/2008    Expected Discharge Date: Expected Discharge Date: 02/15/17  Team Members Present: Physician leading conference: Dr. Alysia Penna Social Worker Present: Alfonse Alpers, LCSW Nurse Present: Other (comment) Blair Heys, RN) PT Present: Dwyane Dee, PT OT Present: Cherylynn Ridges, OT SLP Present: Weston Anna, SLP PPS Coordinator present : Daiva Nakayama, RN, CRRN     Current Status/Progress Goal Weekly Team Focus  Medical   Has colostomy with continued issues with leakage, CBG, still elevated   Instructed patient on how to self manage colostomy, keep blood sugars between 110 and 180. During hospitalization  Initiate rehabilitation program   Bowel/Bladder   Patient continent of bladder, Colostomy noted RLQ w/ minimal output noted.   Patient to remain continent of bladder while on IPR  Continue to monitor B/B needs and address needs PRN   Swallow/Nutrition/ Hydration             ADL's   Min A overall  Mod I overall  Standing endurance, activity tolerance, ostomy care, UB/LB strengthening   Mobility   min assist overall  mod I overall for transfers and gait; supervision car and stairs and community mobility  endurance, functional strengthening, stairs, bed mobility, and gait/balance   Communication             Safety/Cognition/ Behavioral Observations            Pain   Patient denies pain at this time  Patient to have pain <3 while on IPR  Continue to monitor pain level and medicate as ordered.   Skin   Abdominal incision site w/ wet to dry dressing noted. Dressing c/d/i  Patient to maintain remaining skin integrity while on IPR  Continue to monitor skin integrity q shift and PRN    Rehab Goals Patient on target to meet rehab goals: Yes Rehab Goals Revised: none *See Care Plan and progress notes for long and short-term goals.     Barriers to Discharge  Current Status/Progress Possible Resolutions Date Resolved   Physician    Medical stability;Other (comments)  Poor endurance     Continue rehabilitation program.  Nursing  Wound Care               PT                    OT Wound Care                SLP                SW Other (comments) (new colostomy; Pt moving from [place] to [facility])   RN is working with pt on colostomy care.  Dtr is preparing for pt to be at her home.          Discharge Planning/Teaching Needs:  Pt to d/c to her dtr, Carissa's home.  Dtr will take some time off with her mother.  [name] can come for family education next week, as needed.    Team Discussion:  Pt is deconditioned, but has no major medical issues.  MD will continue to monitor kidneys and my need to adjust medications.  He also recommended bengay and heating pad for pt's sore back.  RN talked about pt's pain in her back, chronic issue with bulging disc PTA.  RN is also working with pt on colostomy care, but RN is doing dressing.  IV antibiotic is completed.  Pt is at min A overall with low endurance requiring lots of rest breaks.  Lower surfaces cause pt to need mod A.  Therapists to work on increasing endurance and activity tolerance over the next week.  Revisions to Treatment Plan:  none    Continued Need for Acute Rehabilitation Level of Care: The patient requires daily medical management by a physician with specialized training in physical medicine and rehabilitation for the following conditions: Daily direction of a multidisciplinary physical rehabilitation program to ensure safe treatment while eliciting the highest outcome that is of practical value to the patient.: Yes Daily medical management of patient stability for increased activity during participation in an intensive rehabilitation regime.: Yes Daily analysis of laboratory values and/or radiology reports with any subsequent need for medication adjustment of medical intervention for : Neurological problems;Diabetes problems;Wound care problems  Amandajo Gonder, Silvestre Mesi 02/08/2017, 1:08 PM

## 2017-02-08 NOTE — Progress Notes (Addendum)
Subjective/Complaints: Appreciate WOC note  Review of Systems  Constitutional: Negative.   HENT: Negative.   Eyes: Negative.   Respiratory: Negative.   Cardiovascular: Negative.   Musculoskeletal: Positive for back pain.  Skin: Positive for rash.  Neurological: Negative.   Endo/Heme/Allergies: Negative.   Psychiatric/Behavioral: Negative.    Objective: Vital Signs: Blood pressure (!) 137/58, pulse 91, temperature 99.7 F (37.6 C), temperature source Oral, resp. rate 18, height _0  (1.651 m), weight 94.1 kg (207 lb 8 oz), SpO2 98 %. No results found. Results for orders placed or performed during the hospital encounter of 02/06/17 (from the past 72 hour(s))  Glucose, capillary     Status: Abnormal   Collection Time: 02/06/17  5:33 PM  Result Value Ref Range   Glucose-Capillary 163 (H) 65 - 99 mg/dL   Comment 1 Notify RN   Glucose, capillary     Status: Abnormal   Collection Time: 02/06/17  8:52 PM  Result Value Ref Range   Glucose-Capillary 302 (H) 65 - 99 mg/dL  CBC WITH DIFFERENTIAL     Status: Abnormal   Collection Time: 02/07/17  6:22 AM  Result Value Ref Range   WBC 8.1 4.0 - 10.5 K/uL   RBC 3.08 (L) 3.87 - 5.11 MIL/uL   Hemoglobin 8.4 (L) 12.0 - 15.0 g/dL   HCT 26.3 (L) 36.0 - 46.0 %   MCV 85.4 78.0 - 100.0 fL   MCH 27.3 26.0 - 34.0 pg   MCHC 31.9 30.0 - 36.0 g/dL   RDW 16.1 (H) 11.5 - 15.5 %   Platelets 326 150 - 400 K/uL   Neutrophils Relative % 74 %   Neutro Abs 6.1 1.7 - 7.7 K/uL   Lymphocytes Relative 16 %   Lymphs Abs 1.3 0.7 - 4.0 K/uL   Monocytes Relative 8 %   Monocytes Absolute 0.6 0.1 - 1.0 K/uL   Eosinophils Relative 1 %   Eosinophils Absolute 0.1 0.0 - 0.7 K/uL   Basophils Relative 1 %   Basophils Absolute 0.0 0.0 - 0.1 K/uL  Comprehensive metabolic panel     Status: Abnormal   Collection Time: 02/07/17  6:22 AM  Result Value Ref Range   Sodium 136 135 - 145 mmol/L   Potassium 5.3 (H) 3.5 - 5.1 mmol/L    Comment: NO VISIBLE HEMOLYSIS   Chloride 109 101 - 111 mmol/L   CO2 20 (L) 22 - 32 mmol/L   Glucose, Bld 296 (H) 65 - 99 mg/dL   BUN 29 (H) 6 - 20 mg/dL   Creatinine, Ser 1.31 (H) 0.44 - 1.00 mg/dL   Calcium 9.3 8.9 - 10.3 mg/dL   Total Protein 7.0 6.5 - 8.1 g/dL   Albumin 3.2 (L) 3.5 - 5.0 g/dL   AST 27 15 - 41 U/L   ALT 37 14 - 54 U/L   Alkaline Phosphatase 187 (H) 38 - 126 U/L   Total Bilirubin 0.6 0.3 - 1.2 mg/dL   GFR calc non Af Amer 41 (L) >60 mL/min   GFR calc Af Amer 48 (L) >60 mL/min    Comment: (NOTE) The eGFR has been calculated using the CKD EPI equation. This calculation has not been validated in all clinical situations. eGFR's persistently <60 mL/min signify possible Chronic Kidney Disease.    Anion gap 7 5 - 15  Glucose, capillary     Status: Abnormal   Collection Time: 02/07/17  6:43 AM  Result Value Ref Range   Glucose-Capillary 261 (H) 65 - 99  mg/dL  Glucose, capillary     Status: Abnormal   Collection Time: 02/07/17 12:43 PM  Result Value Ref Range   Glucose-Capillary 362 (H) 65 - 99 mg/dL  Glucose, capillary     Status: Abnormal   Collection Time: 02/07/17  4:09 PM  Result Value Ref Range   Glucose-Capillary 261 (H) 65 - 99 mg/dL  Glucose, capillary     Status: Abnormal   Collection Time: 02/07/17  9:02 PM  Result Value Ref Range   Glucose-Capillary 212 (H) 65 - 99 mg/dL  Glucose, capillary     Status: Abnormal   Collection Time: 02/08/17  6:39 AM  Result Value Ref Range   Glucose-Capillary 112 (H) 65 - 99 mg/dL   Comment 1 Notify RN      HEENT: normal Cardio: RRR and no murmur Resp: CTA B/L and unlabored GI: BS positive and NT, ND Extremity:  Pulses positive and No Edema Skin:   Other colostomy appliance Neuro: Alert/Oriented, Normal Sensory and Abnormal Motor 4+ BUE and BLE Musc/Skel:  Other no pain with UE or LE ROM, no tenderness over great toe Gen NAD   Assessment/Plan: 1. Functional deficits secondary to deconditioning which require 3+ hours per day of  interdisciplinary therapy in a comprehensive inpatient rehab setting. Physiatrist is providing close team supervision and 24 hour management of active medical problems listed below. Physiatrist and rehab team continue to assess barriers to discharge/monitor patient progress toward functional and medical goals. FIM: Function - Bathing Position: Sitting EOB Body parts bathed by patient: Right arm, Left arm, Chest, Abdomen, Front perineal area, Right upper leg, Left upper leg Body parts bathed by helper: Buttocks, Right lower leg, Left lower leg, Back Assist Level: Touching or steadying assistance(Pt > 75%)  Function- Upper Body Dressing/Undressing What is the patient wearing?: Hospital gown Assist Level: Touching or steadying assistance(Pt > 75%) Function - Lower Body Dressing/Undressing What is the patient wearing?: Shoes, Hospital Gown Position: Sitting EOB Shoes - Performed by helper: Don/doff right shoe, Don/doff left shoe Assist for footwear: Partial/moderate assist Assist for lower body dressing: Touching or steadying assistance (Pt > 75%)  Function - Toileting Toileting steps completed by patient: Adjust clothing prior to toileting, Performs perineal hygiene, Adjust clothing after toileting Toileting Assistive Devices: Grab bar or rail, Other (comment) (RW) Assist level: Touching or steadying assistance (Pt.75%)  Function - Air cabin crew transfer assistive device: Elevated toilet seat/BSC over toilet, Walker, Grab bar Assist level to toilet: Touching or steadying assistance (Pt > 75%) Assist level from toilet: Touching or steadying assistance (Pt > 75%) Assist level to bedside commode (at bedside): Touching or steadying assistance (Pt > 75%) Assist level from bedside commode (at bedside): Touching or steadying assistance (Pt > 75%)  Function - Chair/bed transfer Chair/bed transfer method: Stand pivot, Ambulatory Chair/bed transfer assist level: Touching or steadying  assistance (Pt > 75%) Chair/bed transfer assistive device: Armrests, Walker  Function - Locomotion: Wheelchair Will patient use wheelchair at discharge?: No Assist Level: Dependent (Pt equals 0%) Assist Level: Dependent (Pt equals 0%) Assist Level: Dependent (Pt equals 0%) Function - Locomotion: Ambulation Assistive device: Walker-rolling Max distance: 68' Assist level: Touching or steadying assistance (Pt > 75%) Assist level: Touching or steadying assistance (Pt > 75%) Assist level: Touching or steadying assistance (Pt > 75%) Walk 150 feet activity did not occur: Safety/medical concerns Walk 10 feet on uneven surfaces activity did not occur: Safety/medical concerns  Function - Comprehension Comprehension: Auditory Comprehension assist level: Follows complex conversation/direction with no  assist  Function - Expression Expression: Verbal Expression assist level: Expresses complex ideas: With no assist  Function - Social Interaction Social Interaction assist level: Interacts appropriately with others - No medications needed.  Function - Problem Solving Problem solving assist level: Solves complex problems: With extra time  Function - Memory Memory assist level: More than reasonable amount of time Patient normally able to recall (first 3 days only): Current season, Location of own room, Staff names and faces, That he or she is in a hospital  Medical Problem List and Plan:  1. Debilitated secondary to colitis/status post colectomy right abdominal proximal transverse colostomy 01/27/2017  CIR PT, OT ,Team conference today please see physician documentation under team conference tab, met with team face-to-face to discuss problems,progress, and goals. Formulized individual treatment plan based on medical history, underlying problem and comorbidities. 2. DVT Prophylaxis/Anticoagulation: SCDs. Monitor for any signs of DVT  3. Pain Management: Neurontin 100 mg every morning 400 mg daily  at bedtime  Currently no c/os 4. Mood: Provide emotional support  5. Neuropsych: This patient is capable of making decisions on her own behalf.  6. Skin/Wound Care: Routine colostomy care  7. Fluids/Electrolytes/Nutrition: Routine I&O with follow-up chemistries  8.Hypertension. Coreg 6.25 mg twice a day  9. Diastolic congestive heart failure. Monitor for any signs of fluid overload  10. Cardiomyopathy with defibrillator. No chest pain or shortness of breath  11. CKD stage III. Follow-up chemistries upon admission  12. Diabetes mellitus and peripheral neuropathy. Lantus insulin 50 units twice a day,NovoLog 6 units 3 times a day. Check blood sugars before meals and at bedtime. Provide diabetic teaching  AM CBG today in range, monitor may start tradjenta for day time elevation CBG (last 3)   Recent Labs  02/07/17 1609 02/07/17 2102 02/08/17 0639  GLUCAP 261* 212* 112*    -SSI for brief prednisone treatment  13. Acute on chronic anemia. Follow-up CBC. Transfuse 2 units packed red blood cell 02/03/2017 Follow-up GI as needed. Continue PPI  14. Fungal UTI. Presently on ERAXIS and will discuss plan of duration. Diflucan avoided due to history of long QT  15. OSA.CPAP. Singulair 10 mg daily  16. Gout. Uloric 40 mg daily. Acute flare of right toe---will give prednisone 46m po x 2 - CBG up last night,RIght great  toe is doing better, CBGs should go down 17. Hyperlipidemia. Lovaza/Crestor  18. Hypothyroidism. Synthroid   LOS (Days) 2 A FACE TO FACE EVALUATION WAS PERFORMED  Allison Silva E 02/08/2017, 7:11 AM

## 2017-02-08 NOTE — Progress Notes (Signed)
Occupational Therapy Session Note  Patient Details  Name: Crystal [name] MRN: [id number] Date of Birth: Jan 04, 1951  Today's Date: 02/08/2017 OT Individual Time: 9390-3009 OT Individual Time Calculation (min): 77 min    Skilled Therapeutic Interventions/Progress Updates:    Pt received supine in bed agreeable to OT tx session. Focus of session on ADL retraining, functional mobility transfers and activity tolerance. Pt performs supine to sitting EOB with ModA for bringing trunk upright. Pt sat EOB to complete bathing and dressing ADLs, with Pt needing assist to wash back, steady assist provided while standing at RW to wash buttocks and perineal area. Pt requires frequent rest breaks during task/session completion due to fatigue. Pt completed UB/LB dressing with setup assist and steadying assist during standing at RW to pull underwear/pants over hips. MinA for sit<>stand at [facility] throughout session with verbal cues for hand placement. Pt completes functional mobility within room using RW and minguard assist to sink and bathroom for grooming and toileting ADLs. MinGuard assist provided during standing grooming ADLs and for clothing management during toileting (seated rest breaks throughout). Transported Pt totalA via w/c to therapy gym where Pt completed 10 min on handbike (5 min forward and 5 min pedaling backward). O2 and HR monitored throughout session with HR 101-105 after activity and O2 remaining at 98-100% on RA. Pt transported back to room in manner described above where Pt was left seated in w/c, call bell and needs within reach, RN present to administer meds.   Therapy Documentation Precautions:  Precautions Precautions: Fall, ICD/Pacemaker Precaution Comments: colostomy Restrictions Weight Bearing Restrictions: No   Pain: Pain Assessment Pain Assessment: Faces Faces Pain Scale: Hurts a little bit Pain Location: Back Pain Orientation: Lower Pain Descriptors / Indicators: Sore Pain  Intervention(s): Repositioned;Rest;Heat applied ADL: ADL ADL Comments: see functional navigator  See Function Navigator for Current Functional Status.   Therapy/Group: Individual Therapy  Raymondo Band 02/08/2017, 8:30 AM

## 2017-02-08 NOTE — Progress Notes (Signed)
Physical Therapy Session Note  Patient Details  Name: Crystal [name] MRN: [id number] Date of Birth: March 25, 1951  Today's Date: 02/08/2017 PT Individual Time: 1530-1630 PT Individual Time Calculation (min): 60 min   Short Term Goals: Week 1:  PT Short Term Goal 1 (Week 1): = LTGs overall mod I to supervision  Skilled Therapeutic Interventions/Progress Updates:    pt c/o of R hip pain and LBP, no number given. PT treatment session focused on activity tolerance, sitting balance, standing balance, and standing tolerance.  Pt sitting edge of bed upon arrival and agreeable to PT treatment session. Pt sit to stand from bed using bed rails with mod assist for lifting. Pt ambulated 20ft with RW and min guard assist for steadying to w/c. Pt performed w/c mobility 160ft using B UEs with supervision and verbal cues on technique for activity tolerance. Pt performed stand pivot transfer with mod assist for sit to stand for lifting and min guard assist for steadying with pivot. Pt pain level not changed with trunk flexion or extension in standing but had noted increased pain with R lateral flexion and tenderness to palpation of the R piriformis. Pt performed supine <> sit using log roll technique with supervision, increased time, and verbal cues for UE placement. PT instructed pt in the following stretches in supine: hamstring, piriformis, glute x30sec each. Pt ambulated 2x53ft using RW with min guard assist for steadying and safety with noted decreased gait speed. Pt performed 6x sit <> stands with unilateral UE support with min guard assist working on technique with verbal and tactile cues for forward trunk lean and weight shift. Pt performed sitting UE reaching task with B LE support with supervision to work on sitting balance. Pt played horseshoes standing on firm surface with narrow BOS with min guard assist for steadying working on standing balance. Pt performed NuStep (level 3) for 8 minutes working on activity  tolerance and reciprocal stepping pattern retraining for increased step length. Pt ambulated 70ft towards room with RW with min guard assist and reporting increased fatigue. PT rolled pt to room in w/c due to time and pt fatigue. Pt left sitting in w/c with daughter, Estill Bamberg, present and call bell in reach.    Therapy Documentation Precautions:  Precautions Precautions: Fall, ICD/Pacemaker Precaution Comments: colostomy Restrictions Weight Bearing Restrictions: No  See Function Navigator for Current Functional Status.   Therapy/Group: Individual Therapy  Amrom Ore 02/08/2017, 4:53 PM

## 2017-02-08 NOTE — Progress Notes (Signed)
Occupational Therapy Session Note  Patient Details  Name: Crystal [name] MRN: [id number] Date of Birth: 05-28-1951  Today's Date: 02/08/2017 OT Individual Time: 1000-1100 OT Individual Time Calculation (min): 60 min    Short Term Goals:  Skilled Therapeutic Interventions/Progress Updates:    OT treatment session focused on ostomy care, functional activity tolerance, standing endurance, and UB strengthening. Pt ambulated to bathroom with mod A sit<>stand from lower recliner surface 2/2 increased lower back pain, min A for ambulation w/ RW. Pt voided bladder then assisted with emptying ostomy with min A and verbal cues. Pt tolerated 1 minute standing at the sink to wash hands prior to reaching max fatigue. Pt ambulated 30 feet x2 with extended rest breaks in between. OT discussed community safety awareness and elevator access then pt completed UB there-ex seated in wc. 3 sets x 10 of triceps extension, and biceps curl using level 2 orange theraband. Pt returned to supine at end of session and left semi-reclined with needs met.    Therapy Documentation Precautions:  Precautions Precautions: Fall, ICD/Pacemaker Precaution Comments: colostomy Restrictions Weight Bearing Restrictions: No Pain: Pain Assessment Pain Assessment: 0-10 Pain Score: 4  Faces Pain Scale: Hurts a little bit Pain Type: Chronic pain Pain Location: Back Pain Orientation: Lower Pain Descriptors / Indicators: Sore Pain Frequency: Occasional Pain Onset: Gradual Pain Intervention(s):repositioned  See Function Navigator for Current Functional Status.   Therapy/Group: Individual Therapy  Valma Cava 02/08/2017, 10:58 AM

## 2017-02-08 NOTE — Progress Notes (Signed)
Social Work Patient ID: Crystal [name], female   DOB: 09/09/1950, 66 y.o.   MRN: [id number]   CSW met with pt and her dtr [name]) and son-in-law [name]) to update them on team conference discussion.  Pt was so pleased to hear she only needed one more week on CIR, she had happy tears.  Dtr and son-in-law are planning to have pt come to their house and dtr was also pleased with d/c date and will plan to be with pt for a little bit after d/c.  CSW facilitated completion of dtr's FMLA paperwork by PA and returned forms to her in pt's room.  They were appreciative.  Pt will have [place] at home and DME will be ordered.  CSW told Pt/family that CSW will help with this.  CSW will also investigate PCP for pt and gave pt Advanced Directives packet at her request.  If pt decides to complete it while she is here, CSW will arrange notary through [facility].  CSW remains available as needed.

## 2017-02-09 ENCOUNTER — Inpatient Hospital Stay (HOSPITAL_COMMUNITY): Payer: Medicare Other | Admitting: Physical Therapy

## 2017-02-09 ENCOUNTER — Inpatient Hospital Stay (HOSPITAL_COMMUNITY): Payer: Medicare Other | Admitting: Occupational Therapy

## 2017-02-09 DIAGNOSIS — G8929 Other chronic pain: Secondary | ICD-10-CM

## 2017-02-09 DIAGNOSIS — N183 Chronic kidney disease, stage 3 (moderate): Secondary | ICD-10-CM

## 2017-02-09 DIAGNOSIS — M545 Low back pain: Secondary | ICD-10-CM

## 2017-02-09 LAB — GLUCOSE, CAPILLARY
GLUCOSE-CAPILLARY: 99 mg/dL (ref 65–99)
GLUCOSE-CAPILLARY: 155 mg/dL — AB (ref 65–99)
Glucose-Capillary: 210 mg/dL — ABNORMAL HIGH (ref 65–99)
Glucose-Capillary: 274 mg/dL — ABNORMAL HIGH (ref 65–99)

## 2017-02-09 NOTE — Progress Notes (Signed)
Occupational Therapy Session Note  Patient Details  Name: Crystal [name] MRN: [id number] Date of Birth: Dec 31, 1950  Today's Date: 02/09/2017 OT Individual Time: 1000-1057 OT Individual Time Calculation (min): 57 min   Skilled Therapeutic Interventions/Progress Updates:    OT treatment session focused on modified bathing/dressing, activity tolerance, ostomy care, and UB strengthening. Pt gathered clothing using RW with close supervision. Bathing/dressing completed sit<>stand at the sink with min A to thread pant legs 2/2 abdominal incision, min guard A sit<>stand and overall supervision for standing balance with 1 posterior LOB requiring min A to correct. Pt took seated rest break, then ambulated to bathroom to perform ostomy care. Pt needed set-up and min A to obtain supplied and empty ostomy. Pt completed 10 mins on arm bike on level 3 without rest break. Pt returned to room and completed log roll to protect abdominal incision to return to bed.    Therapy Documentation Precautions:  Precautions Precautions: Fall, ICD/Pacemaker Precaution Comments: colostomy Restrictions Weight Bearing Restrictions: No Pain: Pain Assessment Pain Assessment: No/denies pain Pain Score: 0-No pain ADL: ADL ADL Comments: see functional navigator  See Function Navigator for Current Functional Status.   Therapy/Group: Individual Therapy  Valma Cava 02/09/2017, 10:59 AM

## 2017-02-09 NOTE — IPOC Note (Signed)
Overall Plan of Care [facility]) Patient Details Name: Crystal [name] MRN: [id number] DOB: 1951/03/09  Admitting Diagnosis: [facility] Problems: Principal Problem:   Debilitated Active Problems:   Chronic renal insufficiency   Morbid obesity ([place])   Anemia, iron deficiency   Colitis   S/P colostomy ([place])     Functional Problem List: Nursing Bowel, Edema, Endurance, Medication Management, Pain, Safety, Skin Integrity  PT Balance, Endurance, Motor, Pain  OT Safety, Sensory, Pain, Nutrition, Endurance, Motor  SLP    TR         Basic ADL's: OT Grooming, Bathing, Dressing, Toileting     Advanced  ADL's: OT       Transfers: PT Bed Mobility, Bed to Chair, Car, Manufacturing systems engineer, Metallurgist: PT Ambulation, Stairs     Additional Impairments: OT None  SLP        TR      Anticipated Outcomes Item Anticipated Outcome  Self Feeding n/a  Swallowing      Basic self-care  mod I   Toileting  mod I    Bathroom Transfers mod I   Bowel/Bladder  manage bowel (colostmy) with mid assist  Transfers  mod I basic transfers; supervision car  Locomotion  mod I household gait; supervision community and stairs  Communication     Cognition     Pain  3 or less  Safety/Judgment  min assist   Therapy Plan: PT Intensity: Minimum of 1-2 x/day ,45 to 90 minutes PT Frequency: 5 out of 7 days PT Duration Estimated Length of Stay: 7-10 days OT Intensity: Minimum of 1-2 x/day, 45 to 90 minutes OT Frequency: 5 out of 7 days OT Duration/Estimated Length of Stay: ~7-10 days         Team Interventions: Nursing Interventions Patient/Family Education, Pain Management, Bowel Management, Disease Management/Prevention, Skin Care/Wound Management, Discharge Planning, Medication Management  PT interventions Ambulation/gait training, Training and development officer, Community reintegration, Discharge planning, Disease management/prevention, DME/adaptive equipment  instruction, Functional mobility training, Neuromuscular re-education, Pain management, Patient/family education, Psychosocial support, Skin care/wound management, Stair training, Therapeutic Activities, Therapeutic Exercise, UE/LE Strength taining/ROM, Wheelchair propulsion/positioning, UE/LE Coordination activities  OT Interventions Balance/vestibular training, Discharge planning, Pain management, Self Care/advanced ADL retraining, UE/LE Coordination activities, Therapeutic Activities, Therapeutic Exercise, Skin care/wound managment, Patient/family education, Functional mobility training, Disease mangement/prevention, Community reintegration, Engineer, drilling, Neuromuscular re-education, Psychosocial support, UE/LE Strength taining/ROM  SLP Interventions    TR Interventions    SW/CM Interventions Discharge Planning, Psychosocial Support, Patient/Family Education    Team Discharge Planning: Destination: PT-Home ,OT- Home , SLP-  Projected Follow-up: PT-Home health PT, OT-  Home health OT, SLP-  Projected Equipment Needs: PT-Rolling walker with 5" wheels, OT- To be determined, SLP-  Equipment Details: PT- , OT-  Patient/family involved in discharge planning: PT- Patient,  OT-Patient, SLP-   MD ELOS: 7-10d Medical Rehab Prognosis:  Good Assessment:  66 y.o. right handed female with history of diastolic congestive heart failure, nonischemic cardiomyopathy with defibrillator, CKD stage III, diabetes mellitus, OSA with CPAP, hypertension. Per chart review patient lives in [place]. Independent working part-time prior to admission. She plans to stay with her daughter in [place] on discharge. Daughter works during the day but family in the area to provide assistance as needed. Patient presented to [facility] 01/18/2017 with severe right lower quadrant pain. Per report CT showed distended colon presumed colitis. 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 Hospital course septic shock started on TPN. Patient required ongoing IV fluids started on pressors. . Repeat CT scan of the abdomen 01/21/2017 showed mild free abdominal and pelvic fluid without evidence of abscess. Patient was [facility] to [facility] 01/26/2017 for ongoing care. Follow-up CT abdomen and pelvis 01/27/2017 showed subtotal colectomy right abdominal proximal transverse colostomy. Small to moderate volume fluid in the peritoneal cavity.WOC consulted with wet-to-dry dressings. Patient was extubated 01/29/2017 followed by critical care medicine. Blood cultures remain negative broad-spectrum antibiotics later discontinued. Acute blood loss anemia 8.1 and monitored. Patient with episode of large black emesis 02/02/2017. Transfused 2 units packed red blood cell 02/03/2017. Diet was downgraded to clear liquids placed on PPI. Gastroenterology services consulted recommending conservative care. Leukocytosis 16,700 improved at 12,300 to 8800. Creatinine improved from 2.96-1.54. Patient with fungal UTI initially placed on intravenous Rocephin. Patient originally had a Foley tube. Discussed with infectious disease placed on intravenous Anidulafungin and await plan of duration. Diflucan avoided due to history of long QT. Diet has slowly been advanced to mechanical soft.    Now requiring 24/7 Rehab RN,MD, as well as CIR level PT, OT and SLP.  Treatment team will focus on ADLs and mobility with goals set at Mod I See Team Conference Notes for weekly updates to the plan of care

## 2017-02-09 NOTE — Progress Notes (Signed)
Physical Therapy Session Note  Patient Details  Name: Crystal [name] MRN: [id number] Date of Birth: 25-Sep-1950  Today's Date: 02/09/2017 PT Individual Time: 0805-0915 PT Individual Time Calculation (min): 70 min   Short Term Goals: Week 1:  PT Short Term Goal 1 (Week 1): = LTGs overall mod I to supervision  Skilled Therapeutic Interventions/Progress Updates:   Pt sitting at EOB eating breakfast upon arrival and agreeable to therapy. Pt able to maintain dynamic sitting balance at EOB w/ supervision. Pt maintained static standing balance w/ supervision while this PT assisted in LE clothing management and applying pain relief cream to R hip per pt's request.   Pt propelled w/c 135f w/ supervision and verbal cues for technique, pt utilized bilateral UE and LEs.   Pt ambulated [id number] [id number] touching/steady assist and RW. Pt ambulates w/ decreased gait speed, wobbling/stiff knee gait, increased BOS, and decreased foot clearance. Gait mechanics secondary to R hip pain and LBP. Moderate increase in work of breathing towards end of gait bouts, resolves with 2-3 min of seated rest, no clinical signs or symptoms of desaturation.   Pt performed multiple sit<>stand and bed<>chair transfers throughout session w/ touching/steady assist and RW.   NuStep to increase endurance and functional LE strength: 10 min and 5 min @ L3. Moderate increase in work of breathing at end of 10 min, resolves w/ 2-3 min of rest.   Ended session sitting EOB, call bell within reach and all needs met, in care of nursing.   Therapy Documentation Precautions:  Precautions Precautions: Fall, ICD/Pacemaker Precaution Comments: colostomy Restrictions Weight Bearing Restrictions: No Vital Signs: Therapy Vitals Temp: 98.8 F (37.1 C) Temp Source: Oral Pulse Rate: 86 Resp: 18 BP: (!) 128/51 Patient Position (if appropriate): Lying Oxygen Therapy SpO2: 99 % O2 Device: Not Delivered Mobility: 10 mWT: 18.69  sec = 0.53 m/s   See Function Navigator for Current Functional Status.   Therapy/Group: Individual Therapy  Quincee Gittens K Arnette 02/09/2017, 8:50 AM

## 2017-02-09 NOTE — Progress Notes (Signed)
Subjective/Complaints: Discussed D/C date, pt is pleased Myoflex "helping some" with back pain  Review of Systems  Constitutional: Negative.   HENT: Negative.   Eyes: Negative.   Respiratory: Negative.   Cardiovascular: Negative.   Musculoskeletal: Positive for back pain.  Neurological: Negative.   Endo/Heme/Allergies: Negative.   Psychiatric/Behavioral: Negative.    Objective: Vital Signs: Blood pressure (!) 130/49, pulse 95, temperature 99 F (37.2 C), temperature source Oral, resp. rate 17, height '5\' 5"'$  (1.651 m), weight 94.2 kg (207 lb 11.9 oz), SpO2 100 %. No results found. Results for orders placed or performed during the hospital encounter of 02/06/17 (from the past 72 hour(s))  Glucose, capillary     Status: Abnormal   Collection Time: 02/06/17  5:33 PM  Result Value Ref Range   Glucose-Capillary 163 (H) 65 - 99 mg/dL   Comment 1 Notify RN   Glucose, capillary     Status: Abnormal   Collection Time: 02/06/17  8:52 PM  Result Value Ref Range   Glucose-Capillary 302 (H) 65 - 99 mg/dL  CBC WITH DIFFERENTIAL     Status: Abnormal   Collection Time: 02/07/17  6:22 AM  Result Value Ref Range   WBC 8.1 4.0 - 10.5 K/uL   RBC 3.08 (L) 3.87 - 5.11 MIL/uL   Hemoglobin 8.4 (L) 12.0 - 15.0 g/dL   HCT 26.3 (L) 36.0 - 46.0 %   MCV 85.4 78.0 - 100.0 fL   MCH 27.3 26.0 - 34.0 pg   MCHC 31.9 30.0 - 36.0 g/dL   RDW 16.1 (H) 11.5 - 15.5 %   Platelets 326 150 - 400 K/uL   Neutrophils Relative % 74 %   Neutro Abs 6.1 1.7 - 7.7 K/uL   Lymphocytes Relative 16 %   Lymphs Abs 1.3 0.7 - 4.0 K/uL   Monocytes Relative 8 %   Monocytes Absolute 0.6 0.1 - 1.0 K/uL   Eosinophils Relative 1 %   Eosinophils Absolute 0.1 0.0 - 0.7 K/uL   Basophils Relative 1 %   Basophils Absolute 0.0 0.0 - 0.1 K/uL  Comprehensive metabolic panel     Status: Abnormal   Collection Time: 02/07/17  6:22 AM  Result Value Ref Range   Sodium 136 135 - 145 mmol/L   Potassium 5.3 (H) 3.5 - 5.1 mmol/L     Comment: NO VISIBLE HEMOLYSIS   Chloride 109 101 - 111 mmol/L   CO2 20 (L) 22 - 32 mmol/L   Glucose, Bld 296 (H) 65 - 99 mg/dL   BUN 29 (H) 6 - 20 mg/dL   Creatinine, Ser 1.31 (H) 0.44 - 1.00 mg/dL   Calcium 9.3 8.9 - 10.3 mg/dL   Total Protein 7.0 6.5 - 8.1 g/dL   Albumin 3.2 (L) 3.5 - 5.0 g/dL   AST 27 15 - 41 U/L   ALT 37 14 - 54 U/L   Alkaline Phosphatase 187 (H) 38 - 126 U/L   Total Bilirubin 0.6 0.3 - 1.2 mg/dL   GFR calc non Af Amer 41 (L) >60 mL/min   GFR calc Af Amer 48 (L) >60 mL/min    Comment: (NOTE) The eGFR has been calculated using the CKD EPI equation. This calculation has not been validated in all clinical situations. eGFR's persistently <60 mL/min signify possible Chronic Kidney Disease.    Anion gap 7 5 - 15  Glucose, capillary     Status: Abnormal   Collection Time: 02/07/17  6:43 AM  Result Value Ref Range   Glucose-Capillary  261 (H) 65 - 99 mg/dL  Glucose, capillary     Status: Abnormal   Collection Time: 02/07/17 12:43 PM  Result Value Ref Range   Glucose-Capillary 362 (H) 65 - 99 mg/dL  Glucose, capillary     Status: Abnormal   Collection Time: 02/07/17  4:09 PM  Result Value Ref Range   Glucose-Capillary 261 (H) 65 - 99 mg/dL  Glucose, capillary     Status: Abnormal   Collection Time: 02/07/17  9:02 PM  Result Value Ref Range   Glucose-Capillary 212 (H) 65 - 99 mg/dL  Glucose, capillary     Status: Abnormal   Collection Time: 02/08/17  6:39 AM  Result Value Ref Range   Glucose-Capillary 112 (H) 65 - 99 mg/dL   Comment 1 Notify RN   Glucose, capillary     Status: Abnormal   Collection Time: 02/08/17 12:01 PM  Result Value Ref Range   Glucose-Capillary 196 (H) 65 - 99 mg/dL   Comment 1 Notify RN   Glucose, capillary     Status: Abnormal   Collection Time: 02/08/17  4:49 PM  Result Value Ref Range   Glucose-Capillary 101 (H) 65 - 99 mg/dL   Comment 1 Notify RN   Glucose, capillary     Status: Abnormal   Collection Time: 02/08/17  8:41 PM   Result Value Ref Range   Glucose-Capillary 137 (H) 65 - 99 mg/dL  Glucose, capillary     Status: None   Collection Time: 02/09/17  6:32 AM  Result Value Ref Range   Glucose-Capillary 99 65 - 99 mg/dL  Glucose, capillary     Status: Abnormal   Collection Time: 02/09/17 11:23 AM  Result Value Ref Range   Glucose-Capillary 210 (H) 65 - 99 mg/dL     HEENT: normal Cardio: RRR and no murmur Resp: CTA B/L and unlabored GI: BS positive and NT, ND Extremity:  Pulses positive and No Edema Skin:   Other colostomy appliance Neuro: Alert/Oriented, Normal Sensory and Abnormal Motor 4+ BUE and BLE Musc/Skel:  Other no pain with UE or LE ROM, no tenderness over great toe Gen NAD   Assessment/Plan: 1. Functional deficits secondary to deconditioning which require 3+ hours per day of interdisciplinary therapy in a comprehensive inpatient rehab setting. Physiatrist is providing close team supervision and 24 hour management of active medical problems listed below. Physiatrist and rehab team continue to assess barriers to discharge/monitor patient progress toward functional and medical goals. FIM: Function - Bathing Position: Sitting EOB Body parts bathed by patient: Right arm, Left arm, Chest, Abdomen, Front perineal area, Right upper leg, Left upper leg, Right lower leg, Left lower leg, Buttocks Body parts bathed by helper: Back Assist Level: Touching or steadying assistance(Pt > 75%)  Function- Upper Body Dressing/Undressing What is the patient wearing?: Pull over shirt/dress Pull over shirt/dress - Perfomed by patient: Thread/unthread right sleeve, Thread/unthread left sleeve, Put head through opening, Pull shirt over trunk Assist Level: Set up Function - Lower Body Dressing/Undressing What is the patient wearing?: Non-skid slipper socks, Underwear, Pants Position: Sitting EOB Underwear - Performed by patient: Thread/unthread right underwear leg, Thread/unthread left underwear leg, Pull  underwear up/down Pants- Performed by patient: Thread/unthread right pants leg, Thread/unthread left pants leg, Pull pants up/down Non-skid slipper socks- Performed by patient: Don/doff right sock, Don/doff left sock Shoes - Performed by helper: Don/doff right shoe, Don/doff left shoe Assist for footwear: Setup Assist for lower body dressing: Touching or steadying assistance (Pt > 75%)  Function -  Toileting Toileting steps completed by patient: Adjust clothing prior to toileting, Performs perineal hygiene, Adjust clothing after toileting Toileting Assistive Devices: Grab bar or rail Assist level: Touching or steadying assistance (Pt.75%)  Function - Toilet Transfers Toilet transfer assistive device: Bedside commode Assist level to toilet: Touching or steadying assistance (Pt > 75%) Assist level from toilet: Moderate assist (Pt 50 - 74%/lift or lower) Assist level to bedside commode (at bedside): Moderate assist (Pt 50 - 74%/lift or lower) Assist level from bedside commode (at bedside): Touching or steadying assistance (Pt > 75%)  Function - Chair/bed transfer Chair/bed transfer method: Stand pivot, Ambulatory Chair/bed transfer assist level: Touching or steadying assistance (Pt > 75%) Chair/bed transfer assistive device: Walker, Armrests Chair/bed transfer details: Verbal cues for technique, Verbal cues for sequencing  Function - Locomotion: Wheelchair Will patient use wheelchair at discharge?: No Max wheelchair distance: 192f Assist Level: Supervision or verbal cues Assist Level: Supervision or verbal cues Assist Level: Supervision or verbal cues Turns around,maneuvers to table,bed, and toilet,negotiates 3% grade,maneuvers on rugs and over doorsills: No Function - Locomotion: Ambulation Assistive device: Walker-rolling Max distance: 250 ft  Assist level: Touching or steadying assistance (Pt > 75%) Assist level: Touching or steadying assistance (Pt > 75%) Assist level: Touching  or steadying assistance (Pt > 75%) Walk 150 feet activity did not occur: Safety/medical concerns Assist level: Touching or steadying assistance (Pt > 75%) Walk 10 feet on uneven surfaces activity did not occur: Safety/medical concerns  Function - Comprehension Comprehension: Auditory Comprehension assist level: Follows complex conversation/direction with no assist  Function - Expression Expression: Verbal Expression assist level: Expresses complex ideas: With no assist  Function - Social Interaction Social Interaction assist level: Interacts appropriately with others - No medications needed.  Function - Problem Solving Problem solving assist level: Solves complex problems: With extra time  Function - Memory Memory assist level: More than reasonable amount of time Patient normally able to recall (first 3 days only): Current season, Location of own room, Staff names and faces, That he or she is in a hospital  Medical Problem List and Plan:  1. 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 DVT Prophylaxis/Anticoagulation: SCDs. Monitor for any signs of DVT  3. Pain Management: Neurontin 100 mg every morning 400 mg daily at bedtime  Topical cream and add Kpad for CLBP 4. Mood: Provide emotional support  5. Neuropsych: This patient is capable of making decisions on her own behalf.  6. Skin/Wound Care: Routine colostomy care  7. Fluids/Electrolytes/Nutrition: Routine I&O with follow-up chemistries  8.Hypertension. Coreg 6.25 mg twice a day  9. Diastolic congestive heart failure. Monitor for any signs of fluid overload  10. Cardiomyopathy with defibrillator. No chest pain or shortness of breath  11. CKD stage III. Follow-up chemistries upon admission  12. Diabetes mellitus and peripheral neuropathy. Lantus insulin 52 units twice a day,NovoLog 6 units 3 times a day. Check blood sugars  before meals and at bedtime. Provide diabetic teaching  AM CBG today in range, improved after prednisone On Toujeo 100U per day at home will resume this upon D/C On Apidra 20U per day at home will resume this upon D/C CBG (last 3)   Recent Labs  02/08/17 2041 02/09/17 0632 02/09/17 1123  GLUCAP 137* 99 210*    -SSI for brief prednisone treatment  13. Acute on chronic anemia. Follow-up CBC. Transfuse 2 units packed red blood cell 02/03/2017 Follow-up GI  as needed. Continue PPI  14. Fungal UTI. Presently on ERAXIS and will discuss plan of duration. Diflucan avoided due to history of long QT  15. OSA.CPAP. Singulair 10 mg daily  16. Gout. Uloric 40 mg daily. Acute flare of right toe---will give prednisone 38m po x 2 - CBG up last night,RIght great  toe is doing better, CBGs should go down 17. Hyperlipidemia. Lovaza/Crestor  18. Hypothyroidism. Synthroid   LOS (Days) 3 A FACE TO FACE EVALUATION WAS PERFORMED  KIRSTEINS,ANDREW E 02/09/2017, 3:15 PM

## 2017-02-09 NOTE — Progress Notes (Signed)
Patient refuses the use of CPAP 

## 2017-02-09 NOTE — Progress Notes (Signed)
Physical Therapy Session Note  Patient Details  Name: Crystal [name] MRN: [id number] Date of Birth: June 25, 1951  Today's Date: 02/09/2017 PT Individual Time: 1600-1700 PT Individual Time Calculation (min): 60 min   Short Term Goals: Week 1:  PT Short Term Goal 1 (Week 1): = LTGs overall mod I to supervision  Skilled Therapeutic Interventions/Progress Updates:    pt c/o "spasms" in low back when going from standing to sitting throughout session and of a headache. Pt reporting increased fatigue due to amount of walking during previous therapy. PT treatment session focused on standing balance, dynamic balance during gait, and functional transfers.  Pt sitting edge of bed on arrival, agreeable to PT treatment session. Pt sit <> stand throughout session using RW and/or armrests with supervision for safety. Pt performed stand pivot transfer using RW from bed to w/c to mat with min guard assist for steadying. PT rolled pt to/from gym in w/c for time management and energy conservation. Pt played Wii fit balance training x2 without UE support to work on weight shifting and static standing balance. Pt ambulated with RW through obstacle course x2 with cones, uneven surfaces, and a step with min assist throughout for steadying. Pt education on keeping walker close to pt when stepping up/down from various surfaces. Pt returned to room and performed squat pivot transfer from w/c to bed with min guard assist for safety. Pt left supine in bed with call bell in reach and moist hotpack on low back for pain management and RN notified to remove hotpack.   Therapy Documentation Precautions:  Precautions Precautions: Fall, ICD/Pacemaker Precaution Comments: colostomy Restrictions Weight Bearing Restrictions: No  See Function Navigator for Current Functional Status.   Therapy/Group: Individual Therapy  Rasha Ibe 02/09/2017, 5:41 PM

## 2017-02-09 NOTE — Consult Note (Addendum)
[facility] Nurse ostomy follow-up consultnote Surgical team following for assessment and plan of care to midline abd surgical wound.  Stoma type/location: Pt had colostomy surgery on 6/27 at another facility. Stomal assessment/size: Stoma previously had slough and eschar, which is beginning to loosen and fall off. Stoma has also dropped to flush with skin level.  70% slough, 30% red, no further eschar.  Stoma is 2 inches, slightly oval, and flush with skin level.  Peristomal assessment: Previously noted partial thickness skin loss surrounding ostomy site related to [facility] has resolved.  Pt with previous red rash surrounding the stoma is improved from Tues assessment, appearance is consistent with candidiasis.  One piece pouch flexible convex 2 inch pouch applied with barrier ring to attempt to maintain a seal.  Belt added to secure. Applied antifungal powder to skin surrounding. Output: Mod amt small amt brown semi-formed stool Ostomy pouching: 1pc.  Education provided: Demonstrated pouch change to patient and she asked appropriate questions, no family members present. Pt participated in the procedure and was able to open and close velcro to empty and assist with pouch application.Supplies at the [employer] nurse use. Will continue teaching sessions while in rehab; requested patient to have family member present next week for pouch change demonstration since she will plan to discharge soon.  Pt could benefit from home health assistance after discharge.. Enrolled patient in [facility] program: Yes  Julien Girt MSN, RN, CWOCN, [employer], CNS

## 2017-02-09 NOTE — Progress Notes (Signed)
Subjective/Complaints: Discussed D/C date, pt is pleased Myoflex "helping some" with back pain  Review of Systems  Constitutional: Negative.   HENT: Negative.   Eyes: Negative.   Respiratory: Negative.   Cardiovascular: Negative.   Musculoskeletal: Positive for back pain.  Neurological: Negative.   Endo/Heme/Allergies: Negative.   Psychiatric/Behavioral: Negative.    Objective: Vital Signs: Blood pressure (!) 128/51, pulse 86, temperature 98.8 F (37.1 C), temperature source Oral, resp. rate 18, height 5' 5"  (1.651 m), weight 94.2 kg (207 lb 11.9 oz), SpO2 99 %. No results found. Results for orders placed or performed during the hospital encounter of 02/06/17 (from the past 72 hour(s))  Glucose, capillary     Status: Abnormal   Collection Time: 02/06/17  5:33 PM  Result Value Ref Range   Glucose-Capillary 163 (H) 65 - 99 mg/dL   Comment 1 Notify RN   Glucose, capillary     Status: Abnormal   Collection Time: 02/06/17  8:52 PM  Result Value Ref Range   Glucose-Capillary 302 (H) 65 - 99 mg/dL  CBC WITH DIFFERENTIAL     Status: Abnormal   Collection Time: 02/07/17  6:22 AM  Result Value Ref Range   WBC 8.1 4.0 - 10.5 K/uL   RBC 3.08 (L) 3.87 - 5.11 MIL/uL   Hemoglobin 8.4 (L) 12.0 - 15.0 g/dL   HCT 26.3 (L) 36.0 - 46.0 %   MCV 85.4 78.0 - 100.0 fL   MCH 27.3 26.0 - 34.0 pg   MCHC 31.9 30.0 - 36.0 g/dL   RDW 16.1 (H) 11.5 - 15.5 %   Platelets 326 150 - 400 K/uL   Neutrophils Relative % 74 %   Neutro Abs 6.1 1.7 - 7.7 K/uL   Lymphocytes Relative 16 %   Lymphs Abs 1.3 0.7 - 4.0 K/uL   Monocytes Relative 8 %   Monocytes Absolute 0.6 0.1 - 1.0 K/uL   Eosinophils Relative 1 %   Eosinophils Absolute 0.1 0.0 - 0.7 K/uL   Basophils Relative 1 %   Basophils Absolute 0.0 0.0 - 0.1 K/uL  Comprehensive metabolic panel     Status: Abnormal   Collection Time: 02/07/17  6:22 AM  Result Value Ref Range   Sodium 136 135 - 145 mmol/L   Potassium 5.3 (H) 3.5 - 5.1 mmol/L     Comment: NO VISIBLE HEMOLYSIS   Chloride 109 101 - 111 mmol/L   CO2 20 (L) 22 - 32 mmol/L   Glucose, Bld 296 (H) 65 - 99 mg/dL   BUN 29 (H) 6 - 20 mg/dL   Creatinine, Ser 1.31 (H) 0.44 - 1.00 mg/dL   Calcium 9.3 8.9 - 10.3 mg/dL   Total Protein 7.0 6.5 - 8.1 g/dL   Albumin 3.2 (L) 3.5 - 5.0 g/dL   AST 27 15 - 41 U/L   ALT 37 14 - 54 U/L   Alkaline Phosphatase 187 (H) 38 - 126 U/L   Total Bilirubin 0.6 0.3 - 1.2 mg/dL   GFR calc non Af Amer 41 (L) >60 mL/min   GFR calc Af Amer 48 (L) >60 mL/min    Comment: (NOTE) The eGFR has been calculated using the CKD EPI equation. This calculation has not been validated in all clinical situations. eGFR's persistently <60 mL/min signify possible Chronic Kidney Disease.    Anion gap 7 5 - 15  Glucose, capillary     Status: Abnormal   Collection Time: 02/07/17  6:43 AM  Result Value Ref Range   Glucose-Capillary  261 (H) 65 - 99 mg/dL  Glucose, capillary     Status: Abnormal   Collection Time: 02/07/17 12:43 PM  Result Value Ref Range   Glucose-Capillary 362 (H) 65 - 99 mg/dL  Glucose, capillary     Status: Abnormal   Collection Time: 02/07/17  4:09 PM  Result Value Ref Range   Glucose-Capillary 261 (H) 65 - 99 mg/dL  Glucose, capillary     Status: Abnormal   Collection Time: 02/07/17  9:02 PM  Result Value Ref Range   Glucose-Capillary 212 (H) 65 - 99 mg/dL  Glucose, capillary     Status: Abnormal   Collection Time: 02/08/17  6:39 AM  Result Value Ref Range   Glucose-Capillary 112 (H) 65 - 99 mg/dL   Comment 1 Notify RN   Glucose, capillary     Status: Abnormal   Collection Time: 02/08/17 12:01 PM  Result Value Ref Range   Glucose-Capillary 196 (H) 65 - 99 mg/dL   Comment 1 Notify RN   Glucose, capillary     Status: Abnormal   Collection Time: 02/08/17  4:49 PM  Result Value Ref Range   Glucose-Capillary 101 (H) 65 - 99 mg/dL   Comment 1 Notify RN   Glucose, capillary     Status: Abnormal   Collection Time: 02/08/17  8:41 PM   Result Value Ref Range   Glucose-Capillary 137 (H) 65 - 99 mg/dL  Glucose, capillary     Status: None   Collection Time: 02/09/17  6:32 AM  Result Value Ref Range   Glucose-Capillary 99 65 - 99 mg/dL     HEENT: normal Cardio: RRR and no murmur Resp: CTA B/L and unlabored GI: BS positive and NT, ND Extremity:  Pulses positive and No Edema Skin:   Other colostomy appliance Neuro: Alert/Oriented, Normal Sensory and Abnormal Motor 4+ BUE and BLE Musc/Skel:  Other no pain with UE or LE ROM, no tenderness over great toe Gen NAD   Assessment/Plan: 1. Functional deficits secondary to deconditioning which require 3+ hours per day of interdisciplinary therapy in a comprehensive inpatient rehab setting. Physiatrist is providing close team supervision and 24 hour management of active medical problems listed below. Physiatrist and rehab team continue to assess barriers to discharge/monitor patient progress toward functional and medical goals. FIM: Function - Bathing Position: Sitting EOB Body parts bathed by patient: Right arm, Left arm, Chest, Abdomen, Front perineal area, Right upper leg, Left upper leg, Right lower leg, Left lower leg, Buttocks Body parts bathed by helper: Back Assist Level: Touching or steadying assistance(Pt > 75%)  Function- Upper Body Dressing/Undressing What is the patient wearing?: Pull over shirt/dress Pull over shirt/dress - Perfomed by patient: Thread/unthread right sleeve, Thread/unthread left sleeve, Put head through opening, Pull shirt over trunk Assist Level: Set up Function - Lower Body Dressing/Undressing What is the patient wearing?: Non-skid slipper socks, Underwear, Pants Position: Sitting EOB Underwear - Performed by patient: Thread/unthread right underwear leg, Thread/unthread left underwear leg, Pull underwear up/down Pants- Performed by patient: Thread/unthread right pants leg, Thread/unthread left pants leg, Pull pants up/down Non-skid slipper  socks- Performed by patient: Don/doff right sock, Don/doff left sock Shoes - Performed by helper: Don/doff right shoe, Don/doff left shoe Assist for footwear: Setup Assist for lower body dressing: Touching or steadying assistance (Pt > 75%)  Function - Toileting Toileting steps completed by patient: Adjust clothing prior to toileting, Performs perineal hygiene, Adjust clothing after toileting Toileting Assistive Devices: Grab bar or rail Assist level: Touching or  steadying assistance (Pt.75%)  Function - Toilet Transfers Toilet transfer assistive device: Bedside commode Assist level to toilet: Touching or steadying assistance (Pt > 75%) Assist level from toilet: Moderate assist (Pt 50 - 74%/lift or lower) Assist level to bedside commode (at bedside): Moderate assist (Pt 50 - 74%/lift or lower) Assist level from bedside commode (at bedside): Touching or steadying assistance (Pt > 75%)  Function - Chair/bed transfer Chair/bed transfer method: Stand pivot, Ambulatory Chair/bed transfer assist level: Touching or steadying assistance (Pt > 75%) Chair/bed transfer assistive device: Walker, Armrests Chair/bed transfer details: Tactile cues for weight shifting, Verbal cues for sequencing, Verbal cues for technique  Function - Locomotion: Wheelchair Will patient use wheelchair at discharge?: No Assist Level: Dependent (Pt equals 0%) Assist Level: Dependent (Pt equals 0%) Assist Level: Dependent (Pt equals 0%) Function - Locomotion: Ambulation Assistive device: Walker-rolling Max distance: 41f Assist level: Touching or steadying assistance (Pt > 75%) Assist level: Touching or steadying assistance (Pt > 75%) Assist level: Touching or steadying assistance (Pt > 75%) Walk 150 feet activity did not occur: Safety/medical concerns Walk 10 feet on uneven surfaces activity did not occur: Safety/medical concerns  Function - Comprehension Comprehension: Auditory Comprehension assist level:  Follows complex conversation/direction with no assist  Function - Expression Expression: Verbal Expression assist level: Expresses complex ideas: With no assist  Function - Social Interaction Social Interaction assist level: Interacts appropriately with others - No medications needed.  Function - Problem Solving Problem solving assist level: Solves complex problems: With extra time  Function - Memory Memory assist level: More than reasonable amount of time Patient normally able to recall (first 3 days only): Current season, Location of own room, Staff names and faces, That he or she is in a hospital  Medical Problem List and Plan:  1. 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 DVT Prophylaxis/Anticoagulation: SCDs. Monitor for any signs of DVT  3. Pain Management: Neurontin 100 mg every morning 400 mg daily at bedtime  Topical cream and add Kpad for CLBP 4. Mood: Provide emotional support  5. Neuropsych: This patient is capable of making decisions on her own behalf.  6. Skin/Wound Care: Routine colostomy care  7. Fluids/Electrolytes/Nutrition: Routine I&O with follow-up chemistries  8.Hypertension. Coreg 6.25 mg twice a day  9. Diastolic congestive heart failure. Monitor for any signs of fluid overload  10. Cardiomyopathy with defibrillator. No chest pain or shortness of breath  11. CKD stage III. Follow-up chemistries upon admission  12. Diabetes mellitus and peripheral neuropathy. Lantus insulin 50 units twice a day,NovoLog 6 units 3 times a day. Check blood sugars before meals and at bedtime. Provide diabetic teaching  AM CBG today in range, improved after prednisone CBG (last 3)   Recent Labs  02/08/17 1649 02/08/17 2041 02/09/17 0632  GLUCAP 101* 137* 99    -SSI for brief prednisone treatment  13. Acute on chronic anemia. Follow-up CBC. Transfuse 2 units packed red  blood cell 02/03/2017 Follow-up GI as needed. Continue PPI  14. Fungal UTI. Presently on ERAXIS and will discuss plan of duration. Diflucan avoided due to history of long QT  15. OSA.CPAP. Singulair 10 mg daily  16. Gout. Uloric 40 mg daily. Acute flare of right toe---will give prednisone 177mpo x 2 - CBG up last night,RIght great  toe is doing better, CBGs should go down 17. Hyperlipidemia. Lovaza/Crestor  18. Hypothyroidism. Synthroid   LOS (Days) 3  A FACE TO FACE EVALUATION WAS PERFORMED  Charlett Blake 02/09/2017, 6:42 AM

## 2017-02-10 ENCOUNTER — Inpatient Hospital Stay (HOSPITAL_COMMUNITY): Payer: Medicare Other | Admitting: Occupational Therapy

## 2017-02-10 ENCOUNTER — Inpatient Hospital Stay (HOSPITAL_COMMUNITY): Payer: Medicare Other | Admitting: Physical Therapy

## 2017-02-10 ENCOUNTER — Inpatient Hospital Stay (HOSPITAL_COMMUNITY): Payer: Medicare Other

## 2017-02-10 LAB — GLUCOSE, CAPILLARY
GLUCOSE-CAPILLARY: 123 mg/dL — AB (ref 65–99)
GLUCOSE-CAPILLARY: 253 mg/dL — AB (ref 65–99)
GLUCOSE-CAPILLARY: 285 mg/dL — AB (ref 65–99)
GLUCOSE-CAPILLARY: 171 mg/dL — AB (ref 65–99)

## 2017-02-10 MED ORDER — PRO-STAT SUGAR FREE PO LIQD
30.0000 mL | Freq: Three times a day (TID) | ORAL | Status: DC
  Administered 2017-02-10 – 2017-02-15 (×14): 30 mL via ORAL
  Filled 2017-02-10 (×14): qty 30

## 2017-02-10 MED ORDER — GLUCERNA SHAKE PO LIQD
237.0000 mL | Freq: Two times a day (BID) | ORAL | Status: DC
  Administered 2017-02-11: 237 mL via ORAL
  Administered 2017-02-12: 1 via ORAL
  Administered 2017-02-12 – 2017-02-14 (×2): 237 mL via ORAL

## 2017-02-10 NOTE — Progress Notes (Signed)
Physical Therapy Session Note  Patient Details  Name: Crystal [name] MRN: [id number] Date of Birth: Sep 23, 1950  Today's Date: 02/10/2017 PT Individual Time: 0830-0915 PT Individual Time Calculation (min): 45 min   Short Term Goals: Week 1:  PT Short Term Goal 1 (Week 1): = LTGs overall mod I to supervision  Skilled Therapeutic Interventions/Progress Updates:    no c/o pain at rest.  Session focus on balance, activity tolerance, and strengthening.    Pt ambulates to therapy gym with RW and steady assist for safety with min verbal cues for pacing.  Pt completes 2 rounds of zoom ball to fatigue in standing for cardiopulmonary endurance.  Standing balance on airex pad x60s each with regular BOS + 2x narrow BOS.  Pt completes 2x8 reps sit<>squat focus on LE strengthening and endurance.  Pt negotiates 4 steps + 8 steps with 2 rails, seated rest break in between, with min assist and verbal cues for pacing and deep breathing.  Pt returned to room at end of session and positioned in w/c with moist heat to lower back, call bell in reach and needs met.   Therapy Documentation Precautions:  Precautions Precautions: Fall, ICD/Pacemaker Precaution Comments: colostomy Restrictions Weight Bearing Restrictions: No   See Function Navigator for Current Functional Status.   Therapy/Group: Individual Therapy  Earnest Conroy Penven-Crew 02/10/2017, 12:05 PM

## 2017-02-10 NOTE — Progress Notes (Signed)
Occupational Therapy Session Note  Patient Details  Name: Crystal [name] MRN: [id number] Date of Birth: 10-Nov-1950  Today's Date: 02/10/2017 OT Individual Time: 2111-5520 OT Individual Time Calculation (min): 73 min    Skilled Therapeutic Interventions/Progress Updates:    OT treatment session focused on modified bathing/dressing, increased endurance for functional BADL, standing balance/enmdurance and ostomy care. Pt ambulated to bathroom w/ RW and supervision. Pt required set-up and min A to empty ostomy and flush down commode. Pt then completed bathing/dressing sit<>stand with increased time and multiple rest breaks. Provided pt with long-handled sponge to help with washing peri-area and LB. UB and LB dressing completed with close supervision and increased time 2/2 multiple rest breaks in between standing tasks. Pt tolerated 1 minute standing at longest bout. Pt then brought to therapy gym for endurance activity using Wii bowling. Pt required rest breaks in between each set, and was unable to stand for 2/10 sets 2/2 fatigue. Pt returned to room at end o f session and left with needs met.   Therapy Documentation Precautions:  Precautions Precautions: Fall, ICD/Pacemaker Precaution Comments: colostomy Restrictions Weight Bearing Restrictions: No ADL: ADL ADL Comments: see functional navigator     See Function Navigator for Current Functional Status.   Therapy/Group: Individual Therapy  Valma Cava 02/10/2017, 10:36 AM

## 2017-02-10 NOTE — Progress Notes (Signed)
Occupational Therapy Session Note  Patient Details  Name: Crystal [name] MRN: [id number] Date of Birth: 11/14/1950  Today's Date: 02/10/2017 OT Individual Time: 1130-1200 OT Individual Time Calculation (min): 30 min     Skilled Therapeutic Interventions/Progress Updates:    1:1 Therapetuic activity with focus on functional mobility, overall strengthening an activity tolerance. PT continiues to require frequent rest breaks with standing and sitting activity.  Perform stair stepping 10x, functional ambulation with RW and seating UB/LB weights with (3lb) with rest breaks in between.  Overall steadying assist for all standing balance activities.  Therapy Documentation Precautions:  Precautions Precautions: Fall, ICD/Pacemaker Precaution Comments: colostomy Restrictions Weight Bearing Restrictions: No Pain: Pain Assessment Pain Assessment: 0-10 Pain Score: 4  Pain Type: Chronic pain Pain Location: Hip Pain Orientation: Right Pain Descriptors / Indicators: Aching Pain Frequency: Occasional Pain Onset: Gradual Patients Stated Pain Goal: 1 Pain Intervention(s): Medication (See eMAR) ADL: ADL ADL Comments: see functional navigator  See Function Navigator for Current Functional Status.   Therapy/Group: Individual Therapy  Willeen Cass [facility] 02/10/2017, 4:20 PM

## 2017-02-10 NOTE — Progress Notes (Signed)
Subjective/Complaints: Discussed DM meds with Dr Algis Liming Pt states back pain has improved  Review of Systems  Constitutional: Negative.   HENT: Negative.   Eyes: Negative.   Respiratory: Negative.   Cardiovascular: Negative.   Musculoskeletal: Positive for back pain.  Neurological: Negative.   Endo/Heme/Allergies: Negative.   Psychiatric/Behavioral: Negative.    Objective: Vital Signs: Blood pressure (!) 108/49, pulse 86, temperature 98.8 F (37.1 C), temperature source Oral, resp. rate 18, height 5\' 5"  (1.651 m), weight 94.2 kg (207 lb 11.9 oz), SpO2 96 %. No results found. Results for orders placed or performed during the hospital encounter of 02/06/17 (from the past 72 hour(s))  Glucose, capillary     Status: Abnormal   Collection Time: 02/07/17 12:43 PM  Result Value Ref Range   Glucose-Capillary 362 (H) 65 - 99 mg/dL  Glucose, capillary     Status: Abnormal   Collection Time: 02/07/17  4:09 PM  Result Value Ref Range   Glucose-Capillary 261 (H) 65 - 99 mg/dL  Glucose, capillary     Status: Abnormal   Collection Time: 02/07/17  9:02 PM  Result Value Ref Range   Glucose-Capillary 212 (H) 65 - 99 mg/dL  Glucose, capillary     Status: Abnormal   Collection Time: 02/08/17  6:39 AM  Result Value Ref Range   Glucose-Capillary 112 (H) 65 - 99 mg/dL   Comment 1 Notify RN   Glucose, capillary     Status: Abnormal   Collection Time: 02/08/17 12:01 PM  Result Value Ref Range   Glucose-Capillary 196 (H) 65 - 99 mg/dL   Comment 1 Notify RN   Glucose, capillary     Status: Abnormal   Collection Time: 02/08/17  4:49 PM  Result Value Ref Range   Glucose-Capillary 101 (H) 65 - 99 mg/dL   Comment 1 Notify RN   Glucose, capillary     Status: Abnormal   Collection Time: 02/08/17  8:41 PM  Result Value Ref Range   Glucose-Capillary 137 (H) 65 - 99 mg/dL  Glucose, capillary     Status: None   Collection Time: 02/09/17  6:32 AM  Result Value Ref Range   Glucose-Capillary 99 65  - 99 mg/dL  Glucose, capillary     Status: Abnormal   Collection Time: 02/09/17 11:23 AM  Result Value Ref Range   Glucose-Capillary 210 (H) 65 - 99 mg/dL  Glucose, capillary     Status: Abnormal   Collection Time: 02/09/17  5:05 PM  Result Value Ref Range   Glucose-Capillary 274 (H) 65 - 99 mg/dL  Glucose, capillary     Status: Abnormal   Collection Time: 02/09/17  9:22 PM  Result Value Ref Range   Glucose-Capillary 155 (H) 65 - 99 mg/dL   Comment 1 Notify RN   Glucose, capillary     Status: Abnormal   Collection Time: 02/10/17  6:04 AM  Result Value Ref Range   Glucose-Capillary 123 (H) 65 - 99 mg/dL     HEENT: normal Cardio: RRR and no murmur Resp: CTA B/L and unlabored GI: BS positive and NT, ND Extremity:  Pulses positive and No Edema Skin:   Other colostomy appliance Neuro: Alert/Oriented, Normal Sensory and Abnormal Motor 4+ BUE and BLE Musc/Skel:  Other no pain with UE or LE ROM, no tenderness over great toe Gen NAD   Assessment/Plan: 1. Functional deficits secondary to deconditioning which require 3+ hours per day of interdisciplinary therapy in a comprehensive inpatient rehab setting. Physiatrist is providing close  team supervision and 24 hour management of active medical problems listed below. Physiatrist and rehab team continue to assess barriers to discharge/monitor patient progress toward functional and medical goals. FIM: Function - Bathing Position: Sitting EOB Body parts bathed by patient: Right arm, Left arm, Chest, Abdomen, Front perineal area, Right upper leg, Left upper leg, Right lower leg, Left lower leg, Buttocks Body parts bathed by helper: Back Assist Level: Touching or steadying assistance(Pt > 75%)  Function- Upper Body Dressing/Undressing What is the patient wearing?: Pull over shirt/dress Pull over shirt/dress - Perfomed by patient: Thread/unthread right sleeve, Thread/unthread left sleeve, Put head through opening, Pull shirt over  trunk Assist Level: Set up Function - Lower Body Dressing/Undressing What is the patient wearing?: Non-skid slipper socks, Underwear, Pants Position: Sitting EOB Underwear - Performed by patient: Thread/unthread right underwear leg, Thread/unthread left underwear leg, Pull underwear up/down Pants- Performed by patient: Thread/unthread right pants leg, Thread/unthread left pants leg, Pull pants up/down Non-skid slipper socks- Performed by patient: Don/doff right sock, Don/doff left sock Shoes - Performed by helper: Don/doff right shoe, Don/doff left shoe Assist for footwear: Setup Assist for lower body dressing: Touching or steadying assistance (Pt > 75%)  Function - Toileting Toileting steps completed by patient: Adjust clothing prior to toileting, Performs perineal hygiene, Adjust clothing after toileting Toileting Assistive Devices: Grab bar or rail Assist level: Touching or steadying assistance (Pt.75%)  Function - Air cabin crew transfer assistive device: Bedside commode Assist level to toilet: Touching or steadying assistance (Pt > 75%) Assist level from toilet: Moderate assist (Pt 50 - 74%/lift or lower) Assist level to bedside commode (at bedside): Moderate assist (Pt 50 - 74%/lift or lower) Assist level from bedside commode (at bedside): Touching or steadying assistance (Pt > 75%)  Function - Chair/bed transfer Chair/bed transfer method: Stand pivot Chair/bed transfer assist level: Touching or steadying assistance (Pt > 75%) Chair/bed transfer assistive device: Walker Chair/bed transfer details: Tactile cues for initiation, Verbal cues for sequencing  Function - Locomotion: Wheelchair Will patient use wheelchair at discharge?: No Max wheelchair distance: 163ft Assist Level: Supervision or verbal cues Assist Level: Supervision or verbal cues Assist Level: Supervision or verbal cues Turns around,maneuvers to table,bed, and toilet,negotiates 3% grade,maneuvers on  rugs and over doorsills: No Function - Locomotion: Ambulation Assistive device: Walker-rolling Max distance: 22ft Assist level: Touching or steadying assistance (Pt > 75%) Assist level: Touching or steadying assistance (Pt > 75%) Assist level: Touching or steadying assistance (Pt > 75%) Walk 150 feet activity did not occur: Safety/medical concerns Assist level: Touching or steadying assistance (Pt > 75%) Walk 10 feet on uneven surfaces activity did not occur: Safety/medical concerns Assist level: Touching or steadying assistance (Pt > 75%)  Function - Comprehension Comprehension: Auditory Comprehension assist level: Follows complex conversation/direction with no assist  Function - Expression Expression: Verbal Expression assist level: Expresses complex ideas: With no assist  Function - Social Interaction Social Interaction assist level: Interacts appropriately with others - No medications needed.  Function - Problem Solving Problem solving assist level: Solves complex problems: With extra time  Function - Memory Memory assist level: More than reasonable amount of time Patient normally able to recall (first 3 days only): Current season, Location of own room, Staff names and faces, That he or she is in a hospital  Medical Problem List and Plan:  1. 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 DVT Prophylaxis/Anticoagulation: SCDs. Monitor  for any signs of DVT  3. Pain Management: Neurontin 100 mg every morning 400 mg daily at bedtime  Topical cream and add Kpad for CLBP 4. Mood: Provide emotional support  5. Neuropsych: This patient is capable of making decisions on her own behalf.  6. Skin/Wound Care: Routine colostomy care  7. Fluids/Electrolytes/Nutrition: Routine I&O with follow-up chemistries  8.Hypertension. Coreg 6.25 mg twice a day  9. Diastolic congestive heart failure.  Monitor for any signs of fluid overload  10. Cardiomyopathy with defibrillator. No chest pain or shortness of breath  11. CKD stage III. Follow-up chemistries upon admission  12. Diabetes mellitus and peripheral neuropathy. Lantus insulin 52 units twice a day,NovoLog 6 units 3 times a day. Check blood sugars before meals and at bedtime. Provide diabetic teaching  AM CBG today in range, improved after prednisone On Toujeo 100U per day at home will resume this upon D/C On Apidra 20U per day at home will resume this upon D/C CBG (last 3) occ spikes- question diet related pt has basket of snacks next to bed but denies eating them  Recent Labs  02/09/17 1705 02/09/17 2122 02/10/17 0604  GLUCAP 274* 155* 123*    -SSI for brief prednisone treatment  13. Acute on chronic anemia. Follow-up CBC. Transfuse 2 units packed red blood cell 02/03/2017 Follow-up GI as needed. Continue PPI  14. Fungal UTI. Presently on ERAXIS and will discuss plan of duration. Diflucan avoided due to history of long QT  15. OSA.CPAP. Singulair 10 mg daily  16. Gout. Uloric 40 mg daily. Acute flare of right toe---will give prednisone 10mg  po x 2 - CBG up last night,RIght great  toe is doing better, Resolved 17. Hyperlipidemia. Lovaza/Crestor  18. Hypothyroidism. Synthroid   LOS (Days) 4 A FACE TO FACE EVALUATION WAS PERFORMED  Crystal [name] 02/10/2017, 7:10 AM

## 2017-02-10 NOTE — Progress Notes (Signed)
Patient safety maintained.  Patient resting quietly in bed in no apparent distress and no needs voiced.

## 2017-02-10 NOTE — Progress Notes (Signed)
Patient safety maintained.  Patient lying in bed in no apparent distress and no needs voiced at this time.

## 2017-02-10 NOTE — Progress Notes (Signed)
Occupational Therapy Session Note  Patient Details  Name: Crystal [name] MRN: [id number] Date of Birth: 16-Jan-1951  Today's Date: 02/10/2017 OT Individual Time: 1530-1630 OT Individual Time Calculation (min): 60 min    Short Term Goals: Week 1:     Skilled Therapeutic Interventions/Progress Updates:    1;1. Pt reorting back pain and requesting medication. RN alerted and administered medication. Pt stand pivot transfer throughout session with CGA and VC for RW management. Pt ambulates to/from all tx destinations with min A and VC for posture. Pt completes 3 rounds of 12 horse shoe tosses to target reaching laterally to obtain with CGA-MIN A for last round while standing on foam pad. To improve endurance and BUE strength required for ADLs, Pt completes 4 rounds of 60 basketball passes (chest, bounce, and overhead) with 1/2# wrist weights on wrists while seated EOM with supervision. Exited session with pt seated EOB with daughter present in room, call light in reach and all needs met.  Therapy Documentation Precautions:  Precautions Precautions: Fall, ICD/Pacemaker Precaution Comments: colostomy Restrictions Weight Bearing Restrictions: No General:   Vital Signs:  Pain: Pain Assessment Pain Assessment: 0-10 Pain Score: 4  Faces Pain Scale: Hurts a little bit Pain Type: Chronic pain Pain Location: Hip Pain Orientation: Right Pain Descriptors / Indicators: Aching Pain Frequency: Occasional Pain Onset: Gradual Patients Stated Pain Goal: 1 Pain Intervention(s): Medication (See eMAR) ADL: ADL ADL Comments: see functional navigator  See Function Navigator for Current Functional Status.   Therapy/Group: Individual Therapy  Tonny Branch 02/10/2017, 5:13 PM

## 2017-02-11 ENCOUNTER — Inpatient Hospital Stay (HOSPITAL_COMMUNITY): Payer: Medicare Other | Admitting: Occupational Therapy

## 2017-02-11 ENCOUNTER — Inpatient Hospital Stay (HOSPITAL_COMMUNITY): Payer: Medicare Other | Admitting: Physical Therapy

## 2017-02-11 LAB — GLUCOSE, CAPILLARY
GLUCOSE-CAPILLARY: 142 mg/dL — AB (ref 65–99)
GLUCOSE-CAPILLARY: 115 mg/dL — AB (ref 65–99)
GLUCOSE-CAPILLARY: 158 mg/dL — AB (ref 65–99)
Glucose-Capillary: 180 mg/dL — ABNORMAL HIGH (ref 65–99)

## 2017-02-11 NOTE — Procedures (Signed)
Patient has her home CPAP but said she is not going to wear it tonight.

## 2017-02-11 NOTE — Progress Notes (Signed)
Occupational Therapy Session Note  Patient Details  Name: Crystal [name] MRN: [id number] Date of Birth: 1950-08-22  Today's Date: 02/11/2017  Session 1 OT Individual Time: 1000-1100 OT Individual Time Calculation (min): 60 min   Session 2 OT Individual Time: 1500-1530 OT Individual Time Calculation (min): 30 min    Short Term Goals: Week 1:   LTG=STG 2/2 ELOS  Skilled Therapeutic Interventions/Progress Updates:  Session 1   OT treatment session focused on activity tolerance, modified bathing/dressing, and standing endurance. Pt ambulated to bathroom, emptied ostomy bag with min A and ste-up. Discussed placement for supplies at home. Pt completed UB and LB bathing/dressing with overall close supervision, set-up, and 3 rest breaks. Pt tolerated 1 minute standing grooming at longest standing bout. Pt then worked on endurance and activity tolerance with object finding activity in kitchen. Pt utilized B UEs to reach in all 4 quadrants to locate items in upper and lower cabinets using RW and walker bag. Pt ambulated back to room at end of session and left seated in recliner with needs met.  Session 2  Pt ambulated to to therapy gym with 1 seated rest break w/ supervision and RW. Pt completed UB there-ex using arm-bike for 10 mins on level 2 without rest break. Ambulated back to room in similar fashion with 1 extended seated rest break. Pt left semi-reclined in bed with needs.   Therapy Documentation Precautions:  Precautions Precautions: Fall, ICD/Pacemaker Precaution Comments: colostomy Restrictions Weight Bearing Restrictions: No Pain: Pain Assessment Pain Assessment: 0-10 Pain Score: 5  Pain Type: Chronic pain Pain Location: Back Pain Orientation: Lower Pain Descriptors / Indicators: Aching Pain Onset: With Activity Pain Intervention(s): Repositioned ADL: ADL ADL Comments: see functional navigator  See Function Navigator for Current Functional Status.   Therapy/Group:  Individual Therapy  Valma Cava 02/11/2017, 3:30 PM

## 2017-02-11 NOTE — Progress Notes (Signed)
Physical Therapy Session Note  Patient Details  Name: Crystal [name] MRN: [id number] Date of Birth: Dec 10, 1950  Today's Date: 02/11/2017 PT Individual Time: 0800-0900 AND 1300-1343 PT Individual Time Calculation (min): 60 min AND 43 min  Short Term Goals: Week 1:  PT Short Term Goal 1 (Week 1): = LTGs overall mod I to supervision  Skilled Therapeutic Interventions/Progress Updates:   Session 1:   Pt sitting EOB upon arrival and agreeable to therapy.   Pt transferred bed<> chair w/ supervision.   Worked on tolerance to upright activity and endurance w/ ambulation. Moderate increase in work of breathing and fatigue, resolves w/ seated rest. No clinical signs or symptoms of desaturation.   -250' w/ RW and supervision   -200', 150' w/ RW and touching/steady assist on uneven surface (outside)   Instructed pt on use of both quad cane and single point cane to assist relieve R hip pain w/ weightbearing (chronic LBP, R hip pain). Pt w/ increased fatigue relative to RW use. Will continue to trial least restrictive AD.   Ended session in w/c in room, call bell within reach and all needs met.    Session 2:   Pt in recliner upon arrival and agreeable to therapy. Pt c/o increased R hip pain this afternoon.   Pt ambulated to/from toilet w/ close supervision w/ RW. Pt performed all personal hygiene w/ supervision.   NuStep 10 min x2 @ L4 to increase endurance and functional LE strength. No increase in R hip pain after activity. Moderate increase in work of breathing and fatigue, resolves w/ 2-3 min rest.   Ended session in recliner, call bell within reach and all needs met.     Therapy Documentation Precautions:  Precautions Precautions: Fall, ICD/Pacemaker Precaution Comments: colostomy Restrictions Weight Bearing Restrictions: No  See Function Navigator for Current Functional Status.   Therapy/Group: Individual Therapy  Loxley Cibrian K Arnette 02/11/2017, 1:44 PM

## 2017-02-11 NOTE — Procedures (Signed)
Placed patient on her home CPAP for the night.  Patient is tolerating well at this time. 

## 2017-02-12 LAB — GLUCOSE, CAPILLARY
GLUCOSE-CAPILLARY: 108 mg/dL — AB (ref 65–99)
GLUCOSE-CAPILLARY: 176 mg/dL — AB (ref 65–99)
Glucose-Capillary: 189 mg/dL — ABNORMAL HIGH (ref 65–99)
Glucose-Capillary: 156 mg/dL — ABNORMAL HIGH (ref 65–99)

## 2017-02-13 ENCOUNTER — Inpatient Hospital Stay (HOSPITAL_COMMUNITY): Payer: Medicare Other | Admitting: Occupational Therapy

## 2017-02-13 ENCOUNTER — Inpatient Hospital Stay (HOSPITAL_COMMUNITY): Payer: Medicare Other | Admitting: Physical Therapy

## 2017-02-13 LAB — GLUCOSE, CAPILLARY
GLUCOSE-CAPILLARY: 171 mg/dL — AB (ref 65–99)
Glucose-Capillary: 164 mg/dL — ABNORMAL HIGH (ref 65–99)
Glucose-Capillary: 133 mg/dL — ABNORMAL HIGH (ref 65–99)
Glucose-Capillary: 186 mg/dL — ABNORMAL HIGH (ref 65–99)

## 2017-02-13 MED ORDER — COLCHICINE 0.6 MG PO TABS
0.6000 mg | ORAL_TABLET | Freq: Two times a day (BID) | ORAL | Status: DC
  Administered 2017-02-13 – 2017-02-15 (×5): 0.6 mg via ORAL
  Filled 2017-02-13 (×5): qty 1

## 2017-02-13 NOTE — Progress Notes (Signed)
Occupational Therapy Session Note  Patient Details  Name: Crystal [name] MRN: [id number] Date of Birth: Apr 02, 1951  Today's Date: 02/13/2017 OT Individual Time: 1300-1400 OT Individual Time Calculation (min): 60 min    Short Term Goals: Week 1:   LTG  Skilled Therapeutic Interventions/Progress Updates:    Treatment session focused on ADL/IADL training, transfer training, safety awareness, FMC/GMC tasks, balance awareness, and energy conservation techniques.   Pt transferred from sit<>stand w/ s and completed functional mobility at walker level with v/c for safety down to therapy kitchen with S. Pt requires v/c for safety for hand/foot placement, and lock/unlock w/c. Pt completed variety of tasks at standing level in kitchen for balance training, overhead reaching, and weight shifting for simulated IADL tasks. Pt require multiple rest breaks d/t fatigue. Therapist educated pt on energy conservation techniques implemented at home to increase activity tolerance. Pt completed tub transfer with tub transfer bench and use of grab bars and verbal cues for sequencing. Therapist integrated FMC/GMC into therapy session to increase object manipulation and problem solving. Pt c/o pain in back during activity and also noted her "gout" in R foot was bothering her. Pt returned to her room via self propel in w/c using b UE and transferred to EOB with S and call bell in reach.  Therapy Documentation Precautions:  Precautions Precautions: Fall, ICD/Pacemaker Precaution Comments: colostomy Restrictions Weight Bearing Restrictions: No   Pain: Pain Assessment Pain Assessment: Faces Faces Pain Scale: Hurts even more Pain Type: Chronic pain Pain Location: Back Pain Orientation: Mid Pain Descriptors / Indicators: Aching Pain Onset: With Activity Pain Intervention(s): Repositioned ADL: ADL ADL Comments: see functional navigator  See Function Navigator for Current Functional Status.   Therapy/Group:  Individual Therapy  Delon [place] 02/13/2017, 3:09 PM

## 2017-02-13 NOTE — Progress Notes (Signed)
   Discussed Advanced Directive (AD) documentation w/ patient.  Will follow-up 02/14/17.  If/when patient decides to move forward w/ AD, please page on-call chaplain or contact the spiritual care department.

## 2017-02-13 NOTE — Consult Note (Addendum)
[employer] Nurse ostomy follow-up consultnote Surgical team following for assessment and plan of care to midline abd surgical wound.  Stoma type/location: Pt had colostomy surgery on 6/27 at another facility. Stomal assessment/size: Stoma previously had slough and eschar, which is beginning to loosen and fall off and appearance has greatly improved. 80% red, 20% slough, no further eschar. Stoma is 2 inches, slightly oval, and flush with skin level.  Peristomal assessment: Previously noted partial thickness skin loss surrounding ostomy site related to [facility] has resolved. Pt with previous red rash surrounding the stoma has also resolved.  Daughter at bedside for teaching session, since pt plans to discharge on Wed.  Pt assisted with application of one piece pouch flexible convex 2 inch pouch applied with barrier ring to attempt to maintain a seal. Belt added to secure. She states she has been independent with emptying and velcro closure. Output: Mod amt brown semi-formed stool Ostomy pouching: 1pc.  4 barrier rings and pouches at the bedsidefor staff nurse use and discharge home.  Reviewed pouching routines and ordering supplies.  Pt and daughter asked appropriate questions.  Pt could benefit from home health assistance after discharge. Enrolled patient in [place] program: Yes  Julien Girt MSN, RN, CWOCN, [employer], CNS

## 2017-02-13 NOTE — Progress Notes (Signed)
Physical Therapy Session Note  Patient Details  Name: Crystal [name] MRN: [id number] Date of Birth: January 16, 1951  Today's Date: 02/13/2017 PT Individual Time: 2119-4174 PT Individual Time Calculation (min): 60 min   Short Term Goals: Week 1:  PT Short Term Goal 1 (Week 1): = LTGs overall mod I to supervision  Skilled Therapeutic Interventions/Progress Updates:    pt c/o pain in low back throughout session during transition from stand to sit. PT treatment session focused on standing balance, LE strengthening and activity tolerance.  Pt performed w/c mobility 273f room to therapy gym using B UEs with supervision for safety. Pt performed sit <> stands throughout session with RW and armrests with supervision for safety. Pt ambulated 1083fwith RW with supervision for safety. Pt step up/down on Biodex using B UEs and min assist for steadying. Pt performed weight shifts and catching game on Biodex without UE support and min guard assist to work on limits of stability and weight shifting. Pt performed 2 sets, break between, of 8 step up/down on each LE on 6in step with B UE support and min assist for steadying. Pt ambulated 6037fith RW with supervision to the parallel bars. Pt side stepped in parallel bars [id number] B UE support and min guard assist for safety. Pt reporting increased fatigue with the following vitals: BP: 104/38, 98% SpO2, HR: 99bpm. PT rolled pt to room and pt left sitting in w/c with call bell in reach and needs met.   Therapy Documentation Precautions:  Precautions Precautions: Fall, ICD/Pacemaker Precaution Comments: colostomy Restrictions Weight Bearing Restrictions: No   See Function Navigator for Current Functional Status.   Therapy/Group: Individual Therapy  Loretta Kluender 02/13/2017, 4:48 PM

## 2017-02-13 NOTE — Progress Notes (Signed)
Subjective/Complaints: No issues overnite looking forward to D/C, discussed diabetic diet and need to cont at home  Review of Systems  Constitutional: Negative.   HENT: Negative.   Eyes: Negative.   Respiratory: Negative.   Cardiovascular: Negative.   Musculoskeletal: Positive for back pain.  Neurological: Negative.   Endo/Heme/Allergies: Negative.   Psychiatric/Behavioral: Negative.    Objective: Vital Signs: Blood pressure (!) 132/53, pulse 100, temperature 98.3 F (36.8 C), temperature source Oral, resp. rate 16, height 5\' 5"  (1.651 m), weight 94.4 kg (208 lb 3 oz), SpO2 99 %. No results found. Results for orders placed or performed during the hospital encounter of 02/06/17 (from the past 72 hour(s))  Glucose, capillary     Status: Abnormal   Collection Time: 02/10/17 12:05 PM  Result Value Ref Range   Glucose-Capillary 253 (H) 65 - 99 mg/dL   Comment 1 Notify RN   Glucose, capillary     Status: Abnormal   Collection Time: 02/10/17  4:28 PM  Result Value Ref Range   Glucose-Capillary 285 (H) 65 - 99 mg/dL  Glucose, capillary     Status: Abnormal   Collection Time: 02/10/17  8:35 PM  Result Value Ref Range   Glucose-Capillary 171 (H) 65 - 99 mg/dL  Glucose, capillary     Status: Abnormal   Collection Time: 02/11/17  6:29 AM  Result Value Ref Range   Glucose-Capillary 142 (H) 65 - 99 mg/dL  Glucose, capillary     Status: Abnormal   Collection Time: 02/11/17 11:39 AM  Result Value Ref Range   Glucose-Capillary 180 (H) 65 - 99 mg/dL  Glucose, capillary     Status: Abnormal   Collection Time: 02/11/17  4:44 PM  Result Value Ref Range   Glucose-Capillary 115 (H) 65 - 99 mg/dL  Glucose, capillary     Status: Abnormal   Collection Time: 02/11/17  8:43 PM  Result Value Ref Range   Glucose-Capillary 158 (H) 65 - 99 mg/dL  Glucose, capillary     Status: Abnormal   Collection Time: 02/12/17  6:24 AM  Result Value Ref Range   Glucose-Capillary 108 (H) 65 - 99 mg/dL   Glucose, capillary     Status: Abnormal   Collection Time: 02/12/17 11:48 AM  Result Value Ref Range   Glucose-Capillary 176 (H) 65 - 99 mg/dL  Glucose, capillary     Status: Abnormal   Collection Time: 02/12/17  4:33 PM  Result Value Ref Range   Glucose-Capillary 189 (H) 65 - 99 mg/dL  Glucose, capillary     Status: Abnormal   Collection Time: 02/12/17  8:46 PM  Result Value Ref Range   Glucose-Capillary 156 (H) 65 - 99 mg/dL  Glucose, capillary     Status: Abnormal   Collection Time: 02/13/17  6:00 AM  Result Value Ref Range   Glucose-Capillary 164 (H) 65 - 99 mg/dL     HEENT: normal Cardio: RRR and no murmur Resp: CTA B/L and unlabored GI: BS positive and NT, ND Extremity:  Pulses positive and No Edema Skin:   Other colostomy appliance Neuro: Alert/Oriented, Normal Sensory and Abnormal Motor 4+ BUE and BLE Musc/Skel:  Other no pain with UE or LE ROM, no tenderness over great toe Gen NAD   Assessment/Plan: 1. Functional deficits secondary to deconditioning which require 3+ hours per day of interdisciplinary therapy in a comprehensive inpatient rehab setting. Physiatrist is providing close team supervision and 24 hour management of active medical problems listed below. Physiatrist and rehab team  continue to assess barriers to discharge/monitor patient progress toward functional and medical goals. FIM: Function - Bathing Position: Sitting EOB Body parts bathed by patient: Right arm, Left arm, Chest, Abdomen, Front perineal area, Right upper leg, Left upper leg, Right lower leg, Left lower leg, Buttocks Body parts bathed by helper: Back Assist Level: Touching or steadying assistance(Pt > 75%)  Function- Upper Body Dressing/Undressing What is the patient wearing?: Pull over shirt/dress Pull over shirt/dress - Perfomed by patient: Thread/unthread right sleeve, Thread/unthread left sleeve, Put head through opening, Pull shirt over trunk Assist Level: Set up Function - Lower  Body Dressing/Undressing What is the patient wearing?: Non-skid slipper socks, Underwear, Pants Position: Sitting EOB Underwear - Performed by patient: Thread/unthread right underwear leg, Thread/unthread left underwear leg, Pull underwear up/down Pants- Performed by patient: Thread/unthread right pants leg, Thread/unthread left pants leg, Pull pants up/down Non-skid slipper socks- Performed by patient: Don/doff right sock, Don/doff left sock Shoes - Performed by helper: Don/doff right shoe, Don/doff left shoe Assist for footwear: Setup Assist for lower body dressing: Touching or steadying assistance (Pt > 75%)  Function - Toileting Toileting steps completed by patient: Adjust clothing prior to toileting, Performs perineal hygiene, Adjust clothing after toileting Toileting steps completed by helper: Adjust clothing prior to toileting Toileting Assistive Devices: Grab bar or rail Assist level: Supervision or verbal cues  Function - Air cabin crew transfer assistive device: Walker Assist level to toilet: Supervision or verbal cues Assist level from toilet: Supervision or verbal cues Assist level to bedside commode (at bedside): Moderate assist (Pt 50 - 74%/lift or lower) Assist level from bedside commode (at bedside): Touching or steadying assistance (Pt > 75%)  Function - Chair/bed transfer Chair/bed transfer method: Ambulatory Chair/bed transfer assist level: Supervision or verbal cues Chair/bed transfer assistive device: Armrests, Walker Chair/bed transfer details: Tactile cues for initiation, Verbal cues for sequencing  Function - Locomotion: Wheelchair Will patient use wheelchair at discharge?: No Max wheelchair distance: 134ft Assist Level: Supervision or verbal cues Assist Level: Supervision or verbal cues Assist Level: Supervision or verbal cues Turns around,maneuvers to table,bed, and toilet,negotiates 3% grade,maneuvers on rugs and over doorsills: No Function -  Locomotion: Ambulation Assistive device: Walker-rolling Max distance: 250' Assist level: Supervision or verbal cues Assist level: Supervision or verbal cues Assist level: Supervision or verbal cues Walk 150 feet activity did not occur: Safety/medical concerns Assist level: Supervision or verbal cues Walk 10 feet on uneven surfaces activity did not occur: Safety/medical concerns Assist level: Touching or steadying assistance (Pt > 75%)  Function - Comprehension Comprehension: Auditory Comprehension assist level: Follows complex conversation/direction with no assist  Function - Expression Expression: Verbal Expression assist level: Expresses complex ideas: With no assist  Function - Social Interaction Social Interaction assist level: Interacts appropriately with others - No medications needed.  Function - Problem Solving Problem solving assist level: Solves complex problems: Recognizes & self-corrects  Function - Memory Memory assist level: Complete Independence: No helper Patient normally able to recall (first 3 days only): Staff names and faces, That he or she is in a hospital, Location of own room  Medical Problem List and Plan:  1. 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 DVT Prophylaxis/Anticoagulation: SCDs. Monitor for any signs of DVT  3. Pain Management: Neurontin 100 mg every morning 400 mg daily at bedtime  Topical cream and add Kpad for CLBP 4. Mood: Provide emotional support  5.  Neuropsych: This patient is capable of making decisions on her own behalf.  6. Skin/Wound Care: Routine colostomy care  7. Fluids/Electrolytes/Nutrition: Routine I&O with follow-up chemistries  8.Hypertension. Coreg 6.25 mg twice a day  9. Diastolic congestive heart failure. Monitor for any signs of fluid overload  10. Cardiomyopathy with defibrillator. No chest pain or shortness of  breath  11. CKD stage III. Follow-up chemistries upon admission  12. Diabetes mellitus and peripheral neuropathy. Lantus insulin 52 units twice a day,NovoLog 6 units 3 times a day. Check blood sugars before meals and at bedtime. Provide diabetic teaching  On Toujeo 100U per day at home will resume this upon D/C On Apidra 20U per day at home will resume this upon D/C CBG (last 3) occ spikes- question diet related pt has basket of snacks next to bed but denies eating them, will place on Carb mod diet to improve control  Recent Labs  02/12/17 1633 02/12/17 2046 02/13/17 0600  GLUCAP 189* 156* 164*    -SSI for brief prednisone treatment  13. Acute on chronic anemia. Follow-up CBC. Transfuse 2 units packed red blood cell 02/03/2017 Follow-up GI as needed. Continue PPI  14. Fungal UTI. Resolved 15. OSA.CPAP. Singulair 10 mg daily  16. Gout. Uloric 40 mg daily. Acute flare of right toe---will give prednisone 10mg  po x 2 - CBG up last night,RIght great  toe is doing better, Resolved 17. Hyperlipidemia. Lovaza/Crestor  18. Hypothyroidism. Synthroid   LOS (Days) 7 A FACE TO FACE EVALUATION WAS PERFORMED  KIRSTEINS,ANDREW E 02/13/2017, 7:40 AM

## 2017-02-13 NOTE — Progress Notes (Addendum)
Occupational Therapy Session Note  Patient Details  Name: Crystal [name] MRN: [id number] Date of Birth: [birth year]  Today's Date: 02/13/2017 OT Individual Time: 0800-0900 OT Individual Time Calculation (min): 60 min    Short Term Goals: n/a   Skilled Therapeutic Interventions/Progress Updates:    Pt seen for BADL training with a focus on activity tolerance and adapted strategies. Pt bathed and dressed at sink level as she can not shower due to difficulty covering abdominal wound.  She was able to complete all tasks with set up to mod I.  Pt then ambulated to bathroom with RW to do ostomy care. She sat on seat in front of toilet so she could straddle toilet and then empty ostomy contents directly into the toilet.  She was able to do this without A. She then ambulated back to sink to wash hands.  From arm chair, she worked on AROM UE exercises with Pilates based arm circles for 2 sets to build endurance. She needed a short rest break after each set of 20.  Pt requested to get back to bed to rest as her next therapy was not until after lunch. Pt's bed set flat without rails to simulate bed at home. Pt able to get into bed without A.  Pt with all needs met.   Therapy Documentation Precautions:  Precautions Precautions: Fall, ICD/Pacemaker Precaution Comments: colostomy Restrictions Weight Bearing Restrictions: No  Pain: Pain Assessment Pain Assessment: No/denies pain Pain Score: 5  Pain Type: Acute pain Pain Location: Toe (Comment which one) (1st toe) Pain Orientation: Right Pain Descriptors / Indicators: Aching Pain Onset: Progressive Pain Intervention(s): RN made aware;MD notified (Comment) ADL: ADL ADL Comments: see functional navigator  See Function Navigator for Current Functional Status.   Therapy/Group: Individual Therapy  [place] 02/13/2017, 11:56 AM

## 2017-02-14 ENCOUNTER — Inpatient Hospital Stay (HOSPITAL_COMMUNITY): Payer: Medicare Other | Admitting: Physical Therapy

## 2017-02-14 ENCOUNTER — Encounter (HOSPITAL_COMMUNITY): Payer: Self-pay

## 2017-02-14 ENCOUNTER — Inpatient Hospital Stay (HOSPITAL_COMMUNITY): Payer: Medicare Other | Admitting: Occupational Therapy

## 2017-02-14 DIAGNOSIS — M10171 Lead-induced gout, right ankle and foot: Secondary | ICD-10-CM

## 2017-02-14 DIAGNOSIS — T560X1D Toxic effect of lead and its compounds, accidental (unintentional), subsequent encounter: Secondary | ICD-10-CM

## 2017-02-14 LAB — GLUCOSE, CAPILLARY
GLUCOSE-CAPILLARY: 168 mg/dL — AB (ref 65–99)
GLUCOSE-CAPILLARY: 148 mg/dL — AB (ref 65–99)
GLUCOSE-CAPILLARY: 157 mg/dL — AB (ref 65–99)
Glucose-Capillary: 135 mg/dL — ABNORMAL HIGH (ref 65–99)

## 2017-02-14 MED ORDER — FEBUXOSTAT 40 MG PO TABS: 40.0000 mg | ORAL_TABLET | Freq: Every day | ORAL | Status: DC

## 2017-02-14 NOTE — Progress Notes (Signed)
Occupational Therapy Discharge Summary  Patient Details  Name: Crystal [name] MRN: [id number] Date of Birth: [birth year]  Today's Date: 02/14/2017 OT Individual Time: 1100-1156 OT Individual Time Calculation (min): 56 min    Pt ambulated to bathroom w/ RW and transferred onto toilet to empty ostomy into commode. Discussed  Home bathroom set-up and educated on options for supply set-up. Patient successfully obtained wash cloths and gloves and was able to empty ostomy into toilet. Pt completed bathing/dressing sit<>stand at the sink with Mod I and increased time. She has demonstrated improved dynamic standing balance and activity tolerance needed to perform daily self-care tasks  Patient has met 8 of 8 long term goals due to improved activity tolerance, improved balance and ability to compensate for deficits.  Patient to discharge at overall Modified Independent level.  Patient's care partner is independent to provide the necessary physical assistance for higher level iADL at discharge.    Reasons goals not met: n/a  Recommendation:  Patient will benefit from ongoing skilled OT services in home health setting to continue to advance functional skills in the area of BADL.  Equipment: Conservation officer, nature  Reasons for discharge: treatment goals met and discharge from hospital  Patient/family agrees with progress made and goals achieved: Yes  OT Discharge Precautions/Restrictions  Precautions Precautions: Fall;ICD/Pacemaker Precaution Comments: colostomy Restrictions Weight Bearing Restrictions: No Pain Pain Assessment Pain Assessment: 0-10 Pain Score: 3  Pain Type: Chronic pain Pain Location: Back Pain Orientation: Lower Pain Descriptors / Indicators: Aching Pain Onset: With Activity Pain Intervention(s): Repositioned ADL ADL Eating: Independent Grooming: Independent Upper Body Bathing: Independent Lower Body Bathing: Modified independent Upper Body Dressing: Independent Toilet  Transfer: Modified independent Tub/Shower Transfer: Modified independent Tub/Shower Transfer Method: Ambulating ADL Comments: Please see functional navigator Perception  Perception: Within Functional Limits Praxis Praxis: Intact Cognition Overall Cognitive Status: Within Functional Limits for tasks assessed Arousal/Alertness: Awake/alert Sensation Coordination Gross Motor Movements are Fluid and Coordinated: Yes Fine Motor Movements are Fluid and Coordinated: Yes Motor  Motor Motor: Other (comment) Motor - Skilled Clinical Observations: generalized weakness Mobility  Bed Mobility Bed Mobility: Rolling Right;Rolling Left;Supine to Sit;Sit to Supine Rolling Right: 6: Modified independent (Device/Increase time) Rolling Left: 6: Modified independent (Device/Increase time) Supine to Sit: 6: Modified independent (Device/Increase time) Sit to Supine: 6: Modified independent (Device/Increase time) Transfers Sit to Stand: 6: Modified independent (Device/Increase time) Stand to Sit: 6: Modified independent (Device/Increase time)   Balance Static Sitting Balance Static Sitting - Level of Assistance: 7: Independent Dynamic Sitting Balance Dynamic Sitting - Level of Assistance: 6: Modified independent (Device/Increase time) Static Standing Balance Static Standing - Level of Assistance: 6: Modified independent (Device/Increase time) Dynamic Standing Balance Dynamic Standing - Level of Assistance: 6: Modified independent (Device/Increase time) Extremity/Trunk Assessment RUE Assessment RUE Assessment: Within Functional Limits LUE Assessment LUE Assessment: Within Functional Limits   See Function Navigator for Current Functional Status.  Daneen Schick Lavene Penagos 02/14/2017, 8:34 PM

## 2017-02-14 NOTE — Plan of Care (Signed)
Problem: RH BOWEL ELIMINATION Goal: RH STG MANAGE BOWEL W/EQUIPMENT W/ASSISTANCE STG Manage Bowel With Equipment With Assistance  Outcome: Progressing Able to use colostomy supply and care for colostomy with minimum assistance  Problem: RH SKIN INTEGRITY Goal: RH STG SKIN FREE OF INFECTION/BREAKDOWN Outcome: Progressing Dressing clean dry and intact to abdominal incision  Problem: RH SAFETY Goal: RH STG ADHERE TO SAFETY PRECAUTIONS W/ASSISTANCE/DEVICE STG Adhere to Safety Precautions With Assistance/Device.  Outcome: Progressing Patient is compliant with safety measures  Problem: RH PAIN MANAGEMENT Goal: RH STG PAIN MANAGED AT OR BELOW PT'S PAIN GOAL Outcome: Progressing Patient denies pain and discomfort  Problem: RH BLADDER ELIMINATION Goal: RH STG MANAGE BLADDER WITH ASSISTANCE STG Manage Bladder With Assistance  Outcome: Progressing Minimum assistance to bathroom

## 2017-02-14 NOTE — Progress Notes (Signed)
Physical Therapy Discharge Summary  Patient Details  Name: Crystal [name] MRN: [id number] Date of Birth: 05/04/1951  Today's Date: 02/14/2017 PT Individual Time: 1300-1405 PT Individual Time Calculation (min): 65 min   Pt in recliner upon arrival and agreeable to therapy. Pt c/o LBP/R hip pain as detailed below.   Pt performed gait, w/c mobility, and other functional mobility as detailed below. Mobility performed at supervision to Mod I level. Pt w/ decreased tolerance to activity this session secondary to LBP/R hip pain (chronic in nature). Pain subsides w/ seated rest breaks. DGI balance assessment also performed and recorded in PT treatment template, 16/24.   NuStep 15 min @ L3 to increase functional endurance and functional LE strength.   Ended session supine in bed, call bell within reach and all needs met.   Pt planning to discharge to daughter's home where her daughter can provide intermittent supervision, should the need arise. Family education not applicable at this time. Per pt's report, daughter has been to hospital several times and is aware of pt's decreased endurance and functional strength impairments.   Patient has met 12 of 12 long term goals due to improved activity tolerance, improved balance, improved postural control and increased strength.  Patient to discharge at an ambulatory level Modified Independent.   Patient's care partner is independent to provide the necessary physical assistance at discharge.  Reasons goals not met:  N/A  Recommendation:  Patient will benefit from ongoing skilled PT services in outpatient setting to continue to advance safe functional mobility, address ongoing impairments in endurance, functional strength, and minimize fall risk.  Equipment: RW  Reasons for discharge: treatment goals met and discharge from hospital  Patient/family agrees with progress made and goals achieved: Yes  PT  Discharge Precautions/Restrictions Precautions Precautions: Fall;ICD/Pacemaker Precaution Comments: colostomy Restrictions Weight Bearing Restrictions: No Vital Signs Heart rate: 98 bpm Blood pressure: 101/43, sitting  O2 saturation: 100%, room air  Pain Pain Assessment: 0-10  Pain score: 8 Pain type: chronic  Pain location: buttocks Pain orientation: right  Pain descriptors/indicators: aching  Patients stated pain goal: 0 Pain intervention(s): Heat applied  Vision/Perception  Perception Perception: Within Functional Limits Praxis Praxis: Intact  Cognition Overall Cognitive Status: Within Functional Limits for tasks assessed Arousal/Alertness: Awake/alert Orientation Level: Oriented X4 Attention: Selective;Alternating Selective Attention: Appears intact Alternating Attention: Appears intact Memory: Appears intact Awareness: Appears intact Problem Solving: Appears intact Safety/Judgment: Appears intact Sensation Sensation Light Touch: Appears Intact Stereognosis: Appears Intact Coordination Gross Motor Movements are Fluid and Coordinated: Yes Fine Motor Movements are Fluid and Coordinated: Yes Motor  Motor Motor: Other (comment) Motor - Skilled Clinical Observations: generalized weakness  Mobility Bed Mobility Bed Mobility: Rolling Right;Rolling Left;Supine to Sit;Sit to Supine Rolling Right: 5: Supervision Rolling Left: 5: Supervision Supine to Sit: 5: Supervision Sit to Supine: 5: Supervision Transfers Transfers: Yes Sit to Stand: 5: Supervision Stand to Sit: 5: Supervision Locomotion  Ambulation Ambulation: Yes Ambulation/Gait Assistance: 5: Supervision Ambulation Distance (Feet): 150 Feet Assistive device: Rolling walker Gait Gait: Yes Gait Pattern: Impaired Gait Pattern: Decreased step length - left;Decreased step length - right;Shuffle;Wide base of support;Poor foot clearance - right;Poor foot clearance - left Gait velocity: slow Stairs /  Additional Locomotion Stairs: Yes Stairs Assistance: 5: Supervision Stairs Assistance Details: Verbal cues for technique Stair Management Technique: Two rails;Alternating pattern Number of Stairs: 12 Height of Stairs: 6 Curb: 5: Psychiatric nurse: Yes Wheelchair Assistance: 5: Careers information officer: Both upper extremities Wheelchair Parts Management: Supervision/cueing Distance:  150'  Trunk/Postural Assessment  Cervical Assessment Cervical Assessment: Within Functional Limits Thoracic Assessment Thoracic Assessment: Exceptions to [facility] (Increased kyphosis) Lumbar Assessment Lumbar Assessment: Within Functional Limits Postural Control Postural Control: Within Functional Limits  Balance Static Sitting Balance Static Sitting - Level of Assistance: 7: Independent Dynamic Sitting Balance Dynamic Sitting - Level of Assistance: 5: Stand by assistance Static Standing Balance Static Standing - Level of Assistance: 5: Stand by assistance Dynamic Standing Balance Dynamic Standing - Level of Assistance: 5: Stand by assistance Extremity Assessment  RUE Assessment RUE Assessment: Not tested LUE Assessment LUE Assessment: Not tested RLE Assessment RLE Assessment: Exceptions to [facility] RLE Strength Right Hip Flexion: 4/5 Right Hip ABduction: 4+/5 Right Hip ADduction: 4+/5 Right Knee Flexion: 5/5 Right Knee Extension: 5/5 Right Ankle Dorsiflexion: 4+/5 Right Ankle Plantar Flexion: 4+/5 LLE Assessment LLE Assessment: Exceptions to [facility] LLE Strength Left Hip Flexion: 4/5 Left Hip ABduction: 4+/5 Left Hip ADduction: 4+/5 Left Knee Flexion: 5/5 Left Knee Extension: 5/5 Left Ankle Dorsiflexion: 4+/5 Left Ankle Plantar Flexion: 4+/5   See Function Navigator for Current Functional Status.   K Arnette 02/14/2017, 1:47 PM

## 2017-02-14 NOTE — Progress Notes (Signed)
Subjective/Complaints: No issues overnite looking forward to D/C, discussed diabetic diet and need to cont at home  Review of Systems  Constitutional: Negative.   HENT: Negative.   Eyes: Negative.   Respiratory: Negative.   Cardiovascular: Negative.   Musculoskeletal: Positive for back pain.  Neurological: Negative.   Endo/Heme/Allergies: Negative.   Psychiatric/Behavioral: Negative.    Objective: Vital Signs: Blood pressure (!) 110/50, pulse 88, temperature 98.5 F (36.9 C), temperature source Oral, resp. rate 16, height 5\' 5"  (1.651 m), weight 96.2 kg (212 lb 1.3 oz), SpO2 98 %. No results found. Results for orders placed or performed during the hospital encounter of 02/06/17 (from the past 72 hour(s))  Glucose, capillary     Status: Abnormal   Collection Time: 02/11/17 11:39 AM  Result Value Ref Range   Glucose-Capillary 180 (H) 65 - 99 mg/dL  Glucose, capillary     Status: Abnormal   Collection Time: 02/11/17  4:44 PM  Result Value Ref Range   Glucose-Capillary 115 (H) 65 - 99 mg/dL  Glucose, capillary     Status: Abnormal   Collection Time: 02/11/17  8:43 PM  Result Value Ref Range   Glucose-Capillary 158 (H) 65 - 99 mg/dL  Glucose, capillary     Status: Abnormal   Collection Time: 02/12/17  6:24 AM  Result Value Ref Range   Glucose-Capillary 108 (H) 65 - 99 mg/dL  Glucose, capillary     Status: Abnormal   Collection Time: 02/12/17 11:48 AM  Result Value Ref Range   Glucose-Capillary 176 (H) 65 - 99 mg/dL  Glucose, capillary     Status: Abnormal   Collection Time: 02/12/17  4:33 PM  Result Value Ref Range   Glucose-Capillary 189 (H) 65 - 99 mg/dL  Glucose, capillary     Status: Abnormal   Collection Time: 02/12/17  8:46 PM  Result Value Ref Range   Glucose-Capillary 156 (H) 65 - 99 mg/dL  Glucose, capillary     Status: Abnormal   Collection Time: 02/13/17  6:00 AM  Result Value Ref Range   Glucose-Capillary 164 (H) 65 - 99 mg/dL  Glucose, capillary      Status: Abnormal   Collection Time: 02/13/17 11:28 AM  Result Value Ref Range   Glucose-Capillary 133 (H) 65 - 99 mg/dL   Comment 1 Notify RN   Glucose, capillary     Status: Abnormal   Collection Time: 02/13/17  4:15 PM  Result Value Ref Range   Glucose-Capillary 171 (H) 65 - 99 mg/dL  Glucose, capillary     Status: Abnormal   Collection Time: 02/13/17  9:08 PM  Result Value Ref Range   Glucose-Capillary 186 (H) 65 - 99 mg/dL   Comment 1 Notify RN   Glucose, capillary     Status: Abnormal   Collection Time: 02/14/17  6:23 AM  Result Value Ref Range   Glucose-Capillary 168 (H) 65 - 99 mg/dL     HEENT: normal Cardio: RRR and no murmur Resp: CTA B/L and unlabored GI: BS positive and NT, ND Extremity:  Pulses positive and No Edema Skin:   Other colostomy appliance Neuro: Alert/Oriented, Normal Sensory and Abnormal Motor 4+ BUE and BLE Musc/Skel:  Other no pain with UE or LE ROM, no tenderness over great toe Gen NAD   Assessment/Plan: 1. Functional deficits secondary to deconditioning which require 3+ hours per day of interdisciplinary therapy in a comprehensive inpatient rehab setting. Physiatrist is providing close team supervision and 24 hour management of active medical  problems listed below. Physiatrist and rehab team continue to assess barriers to discharge/monitor patient progress toward functional and medical goals. FIM: Function - Bathing Position: Wheelchair/chair at sink Body parts bathed by patient: Right arm, Left arm, Chest, Abdomen, Front perineal area, Right upper leg, Left upper leg, Right lower leg, Left lower leg, Buttocks Body parts bathed by helper: Back Assist Level: Set up  Function- Upper Body Dressing/Undressing What is the patient wearing?: Pull over shirt/dress Pull over shirt/dress - Perfomed by patient: Thread/unthread right sleeve, Thread/unthread left sleeve, Put head through opening, Pull shirt over trunk Assist Level: Set up Function - Lower  Body Dressing/Undressing What is the patient wearing?: Underwear, Shoes Position: Wheelchair/chair at sink Underwear - Performed by patient: Thread/unthread right underwear leg, Thread/unthread left underwear leg, Pull underwear up/down Pants- Performed by patient: Thread/unthread right pants leg, Thread/unthread left pants leg, Pull pants up/down Non-skid slipper socks- Performed by patient: Don/doff right sock, Don/doff left sock Shoes - Performed by patient: Don/doff right shoe, Don/doff left shoe Shoes - Performed by helper: Don/doff right shoe, Don/doff left shoe Assist for footwear: Setup Assist for lower body dressing: Touching or steadying assistance (Pt > 75%)  Function - Toileting Toileting steps completed by patient: Adjust clothing prior to toileting, Performs perineal hygiene, Adjust clothing after toileting Toileting steps completed by helper: Adjust clothing prior to toileting Toileting Assistive Devices: Grab bar or rail Assist level: Supervision or verbal cues  Function - Air cabin crew transfer assistive device: Walker Assist level to toilet: Supervision or verbal cues Assist level from toilet: Supervision or verbal cues Assist level to bedside commode (at bedside): Moderate assist (Pt 50 - 74%/lift or lower) Assist level from bedside commode (at bedside): Touching or steadying assistance (Pt > 75%)  Function - Chair/bed transfer Chair/bed transfer method: Stand pivot Chair/bed transfer assist level: Supervision or verbal cues Chair/bed transfer assistive device: Walker, Armrests Chair/bed transfer details: Verbal cues for precautions/safety, Verbal cues for sequencing, Verbal cues for safe use of DME/AE  Function - Locomotion: Wheelchair Will patient use wheelchair at discharge?: No Type: Manual Max wheelchair distance: 258ft Assist Level: Supervision or verbal cues Assist Level: Supervision or verbal cues Assist Level: Supervision or verbal cues Turns  around,maneuvers to table,bed, and toilet,negotiates 3% grade,maneuvers on rugs and over doorsills: No Function - Locomotion: Ambulation Assistive device: Walker-rolling Max distance: 181ft Assist level: Supervision or verbal cues Assist level: Supervision or verbal cues Assist level: Supervision or verbal cues Walk 150 feet activity did not occur: Safety/medical concerns Assist level: Supervision or verbal cues Walk 10 feet on uneven surfaces activity did not occur: Safety/medical concerns Assist level: Touching or steadying assistance (Pt > 75%)  Function - Comprehension Comprehension: Auditory Comprehension assist level: Follows complex conversation/direction with no assist  Function - Expression Expression: Verbal Expression assist level: Expresses complex ideas: With no assist  Function - Social Interaction Social Interaction assist level: Interacts appropriately with others - No medications needed.  Function - Problem Solving Problem solving assist level: Solves complex problems: With extra time  Function - Memory Memory assist level: More than reasonable amount of time Patient normally able to recall (first 3 days only): Current season, Location of own room, Staff names and faces, That he or she is in a hospital  Medical Problem List and Plan:  1. 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 DVT Prophylaxis/Anticoagulation: SCDs. Monitor for any signs of DVT  3.  Pain Management: Neurontin 100 mg every morning 400 mg daily at bedtime  Topical cream and add Kpad for CLBP 4. Mood: Provide emotional support  5. Neuropsych: This patient is capable of making decisions on her own behalf.  6. Skin/Wound Care: Routine colostomy care  7. Fluids/Electrolytes/Nutrition: Routine I&O with follow-up chemistries  8.Hypertension. Coreg 6.25 mg twice a day  9. Diastolic congestive heart  failure. Monitor for any signs of fluid overload  10. Cardiomyopathy with defibrillator. No chest pain or shortness of breath  11. CKD stage III. Follow-up chemistries upon admission  12. Diabetes mellitus and peripheral neuropathy. Lantus insulin 52 units twice a day,NovoLog 6 units 3 times a day. Check blood sugars before meals and at bedtime. Provide diabetic teaching  On Toujeo 100U per day at home will resume this upon D/C On Apidra 20U per day at home will resume this upon D/C CBG (last 3) occ spikes- question diet related pt has basket of snacks next to bed but denies eating them, will place on Carb mod diet to improve control  Recent Labs  02/13/17 1615 02/13/17 2108 02/14/17 0623  GLUCAP 171* 186* 168*    -SSI for brief prednisone treatment  13. Acute on chronic anemia. Follow-up CBC. Transfuse 2 units packed red blood cell 02/03/2017 Follow-up GI as needed. Continue PPI   15. OSA.CPAP. Singulair 10 mg daily  16. Gout. Uloric 40 mg daily. Acute flare of right toe---will give Colchicine .6mg  BID - just for flare hold uloric until d/c 17. Hyperlipidemia. Lovaza/Crestor  18. Hypothyroidism. Synthroid   LOS (Days) 8 A FACE TO FACE EVALUATION WAS PERFORMED  KIRSTEINS,ANDREW E 02/14/2017, 8:08 AM

## 2017-02-14 NOTE — Plan of Care (Deleted)
Problem: RH Car Transfers Goal: LTG Patient will perform car transfers with assist (PT) LTG: Patient will perform car transfers with assistance (PT).  Outcome: Not Met (add Reason) Transfer performed with touching/steadying assist secondary to limited UE support during transfer.    

## 2017-02-14 NOTE — Progress Notes (Signed)
Physical Therapy Session Note  Patient Details  Name: Crystal [name] MRN: [id number] Date of Birth: 1951-05-27  Today's Date: 02/14/2017 PT Individual Time: 0900-1000 PT Individual Time Calculation (min): 60 min   Short Term Goals: Week 1:  PT Short Term Goal 1 (Week 1): = LTGs overall mod I to supervision  Skilled Therapeutic Interventions/Progress Updates:    pt c/o pain in lower back and R hip with activity. PT treatment session focused on standing balance, higher level ambulation, and activity tolerance.  Pt sitting in recliner upon arrival, agreeable to PT treatment session. Pt sit <> stand throughout session from various surfaces (reclier, mat, w/c) with RW and armrests with supervision for safety and verbal cues for safe use of RW and to lock w/c breaks. Pt ambulates 154ft from room to therapy gym with RW and supervision for safety. Pt stands narrow BOS completing UE task with min guard assist for safety progressing to standing tandem without UE task with min assist for steadying and increased sway noted.  Pt initiated DGI but unable to complete due to increased fatigue and increased low back pain. Pt reports needed to empty colostomy bag and PT rolls pt in w/c to room. Pt ambulates 72ft to/from toilet with HHA and min assist for steadying. Pt able to empty bag with assistance needed only for setup. PT rolled pt in w/c to gym for energy conservation and due to increased pain. PT placed moist hotpack on low back for pain relief and returned pt to room in w/c. Pt stand pivot w/c to recliner using armrests and min assist for steadying. Pt left sitting in recliner with moist hotpack on with OT notified, and call bell in reach.    Therapy Documentation Precautions:  Precautions Precautions: Fall, ICD/Pacemaker Precaution Comments: colostomy Restrictions Weight Bearing Restrictions: No Vital Signs: Therapy Vitals Pulse Rate: 98 BP: (!) 101/43 Patient Position (if appropriate):  Sitting Oxygen Therapy SpO2: 100 % O2 Device: Not Delivered Pulse Oximetry Type: Intermittent Pain: Pain Assessment Pain Assessment: 0-10 Pain Score: 8  Pain Type: Chronic pain Pain Location: Buttocks Pain Orientation: Right Pain Descriptors / Indicators: Aching Patients Stated Pain Goal: 0 Pain Intervention(s): Heat applied  See Function Navigator for Current Functional Status.   Therapy/Group: Individual Therapy  Shlomie Romig 02/14/2017, 11:14 AM

## 2017-02-15 ENCOUNTER — Encounter (HOSPITAL_COMMUNITY): Payer: Medicare Other | Admitting: Psychology

## 2017-02-15 ENCOUNTER — Encounter (HOSPITAL_COMMUNITY): Payer: Self-pay

## 2017-02-15 DIAGNOSIS — F4321 Adjustment disorder with depressed mood: Secondary | ICD-10-CM

## 2017-02-15 LAB — BASIC METABOLIC PANEL
ANION GAP: 10 (ref 5–15)
BUN: 37 mg/dL — ABNORMAL HIGH (ref 6–20)
CHLORIDE: 104 mmol/L (ref 101–111)
CO2: 23 mmol/L (ref 22–32)
Calcium: 9.6 mg/dL (ref 8.9–10.3)
Creatinine, Ser: 1.34 mg/dL — ABNORMAL HIGH (ref 0.44–1.00)
GFR calc non Af Amer: 40 mL/min — ABNORMAL LOW (ref >60)
GFR, EST AFRICAN AMERICAN: 46 mL/min — AB (ref >60)
Glucose, Bld: 134 mg/dL — ABNORMAL HIGH (ref 65–99)
POTASSIUM: 3.8 mmol/L (ref 3.5–5.1)
SODIUM: 137 mmol/L (ref 135–145)

## 2017-02-15 LAB — GLUCOSE, CAPILLARY
GLUCOSE-CAPILLARY: 200 mg/dL — AB (ref 65–99)
Glucose-Capillary: 118 mg/dL — ABNORMAL HIGH (ref 65–99)

## 2017-02-15 MED ORDER — CRESTOR 10 MG PO TABS: 10.0000 mg | ORAL_TABLET | Freq: Every day | ORAL | 0 refills | Status: DC

## 2017-02-15 MED ORDER — INSULIN GLARGINE 300 UNIT/ML ~~LOC~~ SOPN: 52.0000 [IU] | PEN_INJECTOR | Freq: Two times a day (BID) | SUBCUTANEOUS | Status: DC

## 2017-02-15 MED ORDER — PANTOPRAZOLE SODIUM 40 MG PO TBEC: 40.0000 mg | DELAYED_RELEASE_TABLET | Freq: Every day | ORAL | 0 refills | Status: DC

## 2017-02-15 MED ORDER — COLCHICINE 0.6 MG PO TABS: 0.6000 mg | ORAL_TABLET | Freq: Two times a day (BID) | ORAL | 0 refills | Status: DC

## 2017-02-15 MED ORDER — MUSCLE RUB 10-15 % EX CREA: 1.0000 "application " | TOPICAL_CREAM | Freq: Two times a day (BID) | CUTANEOUS | 0 refills | Status: DC | PRN

## 2017-02-15 MED ORDER — CARVEDILOL 6.25 MG PO TABS: 6.2500 mg | ORAL_TABLET | Freq: Two times a day (BID) | ORAL | 0 refills | Status: DC

## 2017-02-15 NOTE — Discharge Instructions (Signed)
Inpatient Rehab Discharge Instructions  Crystal [name] Discharge date and time: 02/15/17   Activities/Precautions/ Functional Status: Activity: no lifting, driving, or strenuous exercise  till cleared by MD Diet: soft--drink plenty of water Wound Care:  Cleanse wound with normal saline. Then pack with wet-to-dry dressings to abdominal wound daily until healed   Functional status:  ___ No restrictions     ___ Walk up steps independently ___ 24/7 supervision/assistance   ___ Walk up steps with assistance _X__ Intermittent supervision/assistance  ___ Bathe/dress independently ___ Walk with walker     _x__ Bathe/dress with assistance ___ Walk Independently    ___ Shower independently ___ Walk with assistance    ___ Shower with assistance _X__ No alcohol     ___ Return to work/school ________   COMMUNITY REFERRALS UPON DISCHARGE:   Home Health:   PT     OT     RN  Agency:  [facility] Phone:  [phone]  Medical Equipment/Items Ordered:  Rolling walker  Agency/Supplier:  [facility]      Phone:  [phone]  Special Instructions: 1. Routine colostomy care. 2.   Follow-up with Dr.Kareem [name] [address]  [place] [address]. Phone [phone]. Contact MD if you develop any problems with your incision/wound--redness, swelling, increase in pain, drainage or if you develop fever or chills.  3. See diet below to prevent gout flares.   Low-Purine Diet Purines are compounds that affect the level of uric acid in your body. A low-purine diet is a diet that is low in purines. Eating a low-purine diet can prevent the level of uric acid in your body from getting too high and causing gout or kidney stones or both. What do I need to know about this diet?  Choose low-purine foods. Examples of low-purine foods are listed in the next section.  Drink plenty of fluids, especially water. Fluids can help remove uric acid from your body. Try to drink 8-16 cups (1.9-3.8 L) a  day.  Limit foods high in fat, especially saturated fat, as fat makes it harder for the body to get rid of uric acid. Foods high in saturated fat include pizza, cheese, ice cream, whole milk, fried foods, and gravies. Choose foods that are lower in fat and lean sources of protein. Use olive oil when cooking as it contains healthy fats that are not high in saturated fat.  Limit alcohol. Alcohol interferes with the elimination of uric acid from your body. If you are having a gout attack, avoid all alcohol.  Keep in mind that different peoples bodies react differently to different foods. You will probably learn over time which foods do or do not affect you. If you discover that a food tends to cause your gout to flare up, avoid eating that food. You can more freely enjoy foods that do not cause problems. If you have any questions about a food item, talk to your dietitian or health care provider. Which foods are low, moderate, and high in purines? The following is a list of foods that are low, moderate, and high in purines. You can eat any amount of the foods that are low in purines. You may be able to have small amounts of foods that are moderate in purines. Ask your health care provider how much of a food moderate in purines you can have. Avoid foods high in purines. Grains  Foods low in purines: Enriched white bread, pasta, rice, cake, cornbread, popcorn.  Foods moderate  in purines: Whole-grain breads and cereals, wheat germ, bran, oatmeal. Uncooked oatmeal. Dry wheat bran or wheat germ.  Foods high in purines: Pancakes, [place] toast, biscuits, muffins. Vegetables  Foods low in purines: All vegetables, except those that are moderate in purines.  Foods moderate in purines: Asparagus, cauliflower, spinach, mushrooms, green peas. Fruits  All fruits are low in purines. Meats and other Protein Foods  Foods low in purines: Eggs, nuts, peanut butter.  Foods moderate in purines: 80-90% lean beef,  lamb, veal, pork, poultry, fish, eggs, peanut butter, nuts. Crab, lobster, oysters, and shrimp. Cooked dried beans, peas, and lentils.  Foods high in purines: Anchovies, sardines, herring, mussels, tuna, codfish, scallops, trout, and haddock. Crystal [name]. Organ meats (such as liver or kidney). Tripe. Game meat. Goose. Sweetbreads. Dairy  All dairy foods are low in purines. Low-fat and fat-free dairy products are best because they are low in saturated fat. Beverages  Drinks low in purines: Water, carbonated beverages, tea, coffee, cocoa.  Drinks moderate in purines: Soft drinks and other drinks sweetened with high-fructose corn syrup. Juices. To find whether a food or drink is sweetened with high-fructose corn syrup, look at the ingredients list.  Drinks high in purines: Alcoholic beverages (such as beer). Condiments  Foods low in purines: Salt, herbs, olives, pickles, relishes, vinegar.  Foods moderate in purines: Butter, margarine, oils, mayonnaise. Fats and Oils  Foods low in purines: All types, except gravies and sauces made with meat.  Foods high in purines: Gravies and sauces made with meat. Other Foods  Foods low in purines: Sugars, sweets, gelatin. Cake. Soups made without meat.  Foods moderate in purines: Meat-based or fish-based soups, broths, or bouillons. Foods and drinks sweetened with high-fructose corn syrup.  Foods high in purines: High-fat desserts (such as ice cream, cookies, cakes, pies, doughnuts, and chocolate). Contact your dietitian for more information on foods that are not listed here. This information is not intended to replace advice given to you by your health care provider. Make sure you discuss any questions you have with your health care provider. Document Released: 11/05/2010 Document Revised: 12/17/2015 Document Reviewed: 06/17/2013 Elsevier Interactive Patient Education  2017 [employer].  My questions have been answered and I understand these  instructions. I will adhere to these goals and the provided educational materials after my discharge from the hospital.  Patient/Caregiver Signature _______________________________ Date __________  Clinician Signature _______________________________________ Date __________  Please bring this form and your medication list with you to all your follow-up doctor's appointments.

## 2017-02-15 NOTE — Progress Notes (Signed)
Patient and family received discharge instructions from Algis Liming, PA-C with verbal understanding. Patient discharged to home with daughter and patient belongings.

## 2017-02-15 NOTE — Plan of Care (Signed)
Problem: RH BOWEL ELIMINATION Goal: RH STG MANAGE BOWEL W/EQUIPMENT W/ASSISTANCE STG Manage Bowel With Equipment With Assistance  Outcome: Progressing Own colostomy care  Problem: RH SKIN INTEGRITY Goal: RH STG SKIN FREE OF INFECTION/BREAKDOWN Outcome: Progressing Dressing clean, dry and intact to abdominal incision, no other skin issues noted Goal: RH STG ABLE TO PERFORM INCISION/WOUND CARE W/ASSISTANCE STG Able To Perform Incision/Wound Care With Assistance.  Outcome: Not Progressing Staff is performing ressing changes  Problem: RH SAFETY Goal: RH STG ADHERE TO SAFETY PRECAUTIONS W/ASSISTANCE/DEVICE STG Adhere to Safety Precautions With Assistance/Device.  Outcome: Progressing No safety issues noted, safety precautions maintained  Problem: RH PAIN MANAGEMENT Goal: RH STG PAIN MANAGED AT OR BELOW PT'S PAIN GOAL Outcome: Progressing Denies pain and discomfort

## 2017-02-15 NOTE — Consult Note (Signed)
Neuropsychological Consultation   Patient:   Crystal [name]   DOB:   1950-12-18  MR Number:  [id number]  Location:  [place] [address] [facility] [address] [id number] [place] [address] Dept: [place]: [phone]           Date of Service:   02/15/2017  Start Time:   9 AM End Time:   10 AM  Provider/Observer:  Ilean Skill, Psy.D.       Clinical Neuropsychologist       Billing Code/Service: [phone] Units  Chief Complaint:    The patient had been dealing with depressive symptoms after her long and complicated hospital stay.  She had great fears that she was not going to improve and would never be able to go home.     Reason for Service:  Crystal [name] is a 66 year old female with history of congestive heart failure, nonischemic cardiomyopathy with defibrillator, CKD stage III, diabetes mellitus, OSA with CPAP, hypertension. 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  Extended intubation period and septic shock were part of her course.  After extended hospital stay she developed debility due to lack of physical activity.  Referred for CIR.  Below is the HPI for current admission.  HPI: Crystal [name] is a 66 y.o. right handed female with history of diastolic Per chart review patient lives in [place]. Independent working part-time prior to admission. She plans to stay with her daughter in [place] on discharge. Daughter works during the day but family in the area to provide assistance as needed. Patient presented to [facility] 01/18/2017 with severe right lower quadrant pain. Per report CT showed distended colon presumed colitis. 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 Hospital course  septic shock started on TPN. Patient required ongoing IV fluids started on pressors. . Repeat CT scan of the abdomen 01/21/2017 showed mild free abdominal and pelvic fluid without evidence of abscess. Patient was transferred to [facility] 01/26/2017 for ongoing care. Follow-up CT abdomen and pelvis 01/27/2017 showed subtotal colectomy right abdominal proximal transverse colostomy. Small to moderate volume fluid in the peritoneal cavity.WOC consulted with wet-to-dry dressings. Patient was extubated 01/29/2017 followed by critical care medicine. Blood cultures remain negative broad-spectrum antibiotics later discontinued. Acute blood loss anemia 8.1 and monitored. Patient with episode of large black emesis 02/02/2017. Transfused 2 units packed red blood cell 02/03/2017. Diet was downgraded to clear liquids placed on PPI. Gastroenterology services consulted recommending conservative care. Leukocytosis 16,700 improved at 12,300 to 8800. Creatinine improved from 2.96-1.54. Patient with fungal UTI initially placed on intravenous Rocephin. Patient originally had a Foley tube. Discussed with infectious disease placed on intravenous Anidulafungin and await plan of duration. Diflucan avoided due to history of long QT. Diet has slowly been advanced to mechanical soft. Physical and occupational therapy evaluations completed 02/01/2017 with recommendations of physical medicine rehabilitation consult. Patient was admitted for a comprehensive rehabilitation program   Current Status:  The patient had been having symptoms of depression and fears that she would not get better enough to go home.  As she has improved these adjustment symptoms have improved.  Behavioral Observation: Crystal [name]  presents as a 66 y.o.-year-old Right handed Female who appeared her stated age. her dress was  Appropriate and she was Well Groomed and her manners were Appropriate to the situation.  her participation was indicative of  Appropriate and Attentive behaviors.  There were physical disabilities noted due to continued but improving strenght.  she displayed an appropriate level of cooperation and motivation.     Interactions:    Active Appropriate and Attentive  Attention:   within normal limits and attention span and concentration were age appropriate  Memory:   within normal limits; recent and remote memory intact  Visuo-spatial:  within normal limits  Speech (Volume):  low  Speech:   normal;   Thought Process:  Coherent and Relevant  Though Content:  WNL; not suicidal  Orientation:   person, place, time/date and situation  Judgment:   Good  Planning:   Good  Affect:    Anxious  Mood:    Anxious  Insight:   Good  Intelligence:   normal  Marital Status/Living: The patient will be going to live with her daughter.  Medical History:   Past Medical History:  Diagnosis Date  . Anemia, iron deficiency   . Angioedema    felt to likely be due to ace inhibitors but says she has had this even off of medicines, appears to be tolerating ARBs chronically  . Automatic implantable cardiac defibrillator in situ    high RV threshold chronically, device was turned off in 2014  . Back pain   . CHF (congestive heart failure) ([place])   . Chronic renal insufficiency   . Diabetes mellitus, type 2 ([place])   . Gout   . Hyperlipidemia   . Hypertension   . Intervertebral disc degeneration   . LBBB (left bundle branch block)   . Nonischemic cardiomyopathy ([place])    s/p ICD in 2005. EF has since recovered  . Obesity   . Systemic hypertension   . Unspecified hypothyroidism         Abuse/Trauma History: No reports of history of abuse or trauma beyond recent significant medical issues.  Psychiatric History:  No prior history of significant depression or anxiety except with recent adjustment issues.  Family Med/Psych History:  Family History  Problem Relation Age of Onset  . Hypertension Father   . Heart  disease Father   . Cancer Father        prostate  . Heart disease Other        (Maternal side) Ischemic heart disease  . Diabetes Mellitus II Other   . Arrhythmia Mother   . Diabetes Mellitus II Mother        Borderline DM  . Hypertension Mother     Risk of Suicide/Violence: virtually non-existent No reports of SI or HI  Impression/DX:  Crystal [name] is a 66 year old female with history of congestive heart failure, nonischemic cardiomyopathy with defibrillator, CKD stage III, diabetes mellitus, OSA with CPAP, hypertension. 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  Extended intubation period and septic shock were part of her course.  After extended hospital stay she developed debility due to lack of physical activity.  Referred for CIR.  The patient had been dealing with depressive symptoms after her long and complicated hospital stay.  She had great fears that she was not going to improve and would never be able to go home.   Disposition/Plan:  Patient to be discharged today after significant improvement in overall functioning.  Diagnosis:    Debility        Electronically  Signed   _______________________ Ilean Skill, Psy.D.

## 2017-02-15 NOTE — Progress Notes (Signed)
   02/15/17 1000  Clinical Encounter Type  Visited With Patient and family together;Health care provider  Visit Type Initial;Spiritual support  Referral From Nurse  Consult/Referral To Chaplain  Spiritual Encounters  Spiritual Needs Literature;Brochure;Emotional  Stress Factors  Patient Stress Factors Health changes  Family Stress Factors Health changes;Lack of knowledge    Chaplain responded to page for AD/HCPOA. Completed paperwork with patient and daughter, paged notary and facilitated witness documentation. Provided emotional support and ministry of presence. Sheriece Jefcoat L. Volanda Napoleon, MDiv

## 2017-02-15 NOTE — Progress Notes (Signed)
Subjective/Complaints: Has Neuropsych eval but pt no longer feels depressed. No competency issues or cognitive deficit noted.  Review of Systems  Constitutional: Negative.   HENT: Negative.   Eyes: Negative.   Respiratory: Negative.   Cardiovascular: Negative.   Musculoskeletal: Positive for back pain.  Neurological: Negative.   Endo/Heme/Allergies: Negative.   Psychiatric/Behavioral: Negative.    Objective: Vital Signs: Blood pressure (!) 137/57, pulse 85, temperature 98.4 F (36.9 C), temperature source Oral, resp. rate 16, height 5\' 5"  (1.651 m), weight 96.2 kg (212 lb 1.9 oz), SpO2 100 %. No results found. Results for orders placed or performed during the hospital encounter of 02/06/17 (from the past 72 hour(s))  Glucose, capillary     Status: Abnormal   Collection Time: 02/12/17 11:48 AM  Result Value Ref Range   Glucose-Capillary 176 (H) 65 - 99 mg/dL  Glucose, capillary     Status: Abnormal   Collection Time: 02/12/17  4:33 PM  Result Value Ref Range   Glucose-Capillary 189 (H) 65 - 99 mg/dL  Glucose, capillary     Status: Abnormal   Collection Time: 02/12/17  8:46 PM  Result Value Ref Range   Glucose-Capillary 156 (H) 65 - 99 mg/dL  Glucose, capillary     Status: Abnormal   Collection Time: 02/13/17  6:00 AM  Result Value Ref Range   Glucose-Capillary 164 (H) 65 - 99 mg/dL  Glucose, capillary     Status: Abnormal   Collection Time: 02/13/17 11:28 AM  Result Value Ref Range   Glucose-Capillary 133 (H) 65 - 99 mg/dL   Comment 1 Notify RN   Glucose, capillary     Status: Abnormal   Collection Time: 02/13/17  4:15 PM  Result Value Ref Range   Glucose-Capillary 171 (H) 65 - 99 mg/dL  Glucose, capillary     Status: Abnormal   Collection Time: 02/13/17  9:08 PM  Result Value Ref Range   Glucose-Capillary 186 (H) 65 - 99 mg/dL   Comment 1 Notify RN   Glucose, capillary     Status: Abnormal   Collection Time: 02/14/17  6:23 AM  Result Value Ref Range   Glucose-Capillary 168 (H) 65 - 99 mg/dL  Glucose, capillary     Status: Abnormal   Collection Time: 02/14/17 11:15 AM  Result Value Ref Range   Glucose-Capillary 148 (H) 65 - 99 mg/dL   Comment 1 Notify RN   Glucose, capillary     Status: Abnormal   Collection Time: 02/14/17  4:22 PM  Result Value Ref Range   Glucose-Capillary 135 (H) 65 - 99 mg/dL   Comment 1 Notify RN   Glucose, capillary     Status: Abnormal   Collection Time: 02/14/17  8:43 PM  Result Value Ref Range   Glucose-Capillary 157 (H) 65 - 99 mg/dL  Glucose, capillary     Status: Abnormal   Collection Time: 02/15/17  6:29 AM  Result Value Ref Range   Glucose-Capillary 118 (H) 65 - 99 mg/dL     HEENT: normal Cardio: RRR and no murmur Resp: CTA B/L and unlabored GI: BS positive and NT, ND Extremity:  Pulses positive and No Edema Skin:   Other colostomy appliance Neuro: Alert/Oriented, Normal Sensory and Abnormal Motor 4+ BUE and BLE Musc/Skel:  Other no pain with UE or LE ROM, no tenderness over great toe Gen NAD   Assessment/Plan: 1. Functional deficits secondary to deconditioning  Stable for D/C today F/u PCP in 3-4 weeks F/u Gen Surgery F/u PM&R  2 weeks See D/C summary See D/C instructions FIM: Function - Bathing Position: Wheelchair/chair at sink Body parts bathed by patient: Right arm, Chest, Left arm, Front perineal area, Abdomen, Right upper leg, Buttocks, Left upper leg, Left lower leg, Right lower leg Body parts bathed by helper: Back Assist Level: More than reasonable time  Function- Upper Body Dressing/Undressing What is the patient wearing?: Pull over shirt/dress Pull over shirt/dress - Perfomed by patient: Thread/unthread right sleeve, Thread/unthread left sleeve, Put head through opening, Pull shirt over trunk Assist Level: No help, No cues Function - Lower Body Dressing/Undressing What is the patient wearing?: Underwear, Pants, Shoes Position: Wheelchair/chair at sink Underwear -  Performed by patient: Thread/unthread right underwear leg, Thread/unthread left underwear leg, Pull underwear up/down Pants- Performed by patient: Thread/unthread right pants leg, Thread/unthread left pants leg, Pull pants up/down Non-skid slipper socks- Performed by patient: Don/doff right sock, Don/doff left sock Shoes - Performed by patient: Don/doff right shoe, Don/doff left shoe Shoes - Performed by helper: Don/doff right shoe, Don/doff left shoe Assist for footwear: Independent Assist for lower body dressing: More than reasonable time  Function - Toileting Toileting steps completed by patient: Adjust clothing prior to toileting, Performs perineal hygiene, Adjust clothing after toileting Toileting steps completed by helper: Adjust clothing prior to toileting Toileting Assistive Devices: Grab bar or rail Assist level: More than reasonable time  Function - Air cabin crew transfer assistive device: Walker Assist level to toilet: Supervision or verbal cues Assist level from toilet: Supervision or verbal cues Assist level to bedside commode (at bedside): Moderate assist (Pt 50 - 74%/lift or lower) Assist level from bedside commode (at bedside): Touching or steadying assistance (Pt > 75%)  Function - Chair/bed transfer Chair/bed transfer method: Ambulatory Chair/bed transfer assist level: No Help, no cues, assistive device, takes more than a reasonable amount of time Chair/bed transfer assistive device: Armrests, Walker Chair/bed transfer details: Verbal cues for safe use of DME/AE  Function - Locomotion: Wheelchair Will patient use wheelchair at discharge?: No Type: Manual Max wheelchair distance: 150 Assist Level: No help, No cues, assistive device, takes more than reasonable amount of time Assist Level: No help, No cues, assistive device, takes more than reasonable amount of time Assist Level: Supervision or verbal cues Turns around,maneuvers to table,bed, and  toilet,negotiates 3% grade,maneuvers on rugs and over doorsills: No Function - Locomotion: Ambulation Assistive device: Walker-rolling Max distance: 150 Assist level: No help, No cues, assistive device, takes more than a reasonable amount of time Assist level: No help, No cues, assistive device, takes more than a reasonable amount of time Assist level: No help, No cues, assistive device, takes more than a reasonable amount of time Walk 150 feet activity did not occur: Safety/medical concerns Assist level: No help, No cues, assistive device, takes more than a reasonable amount of time Walk 10 feet on uneven surfaces activity did not occur: Safety/medical concerns (Increased orthostasis during this morning's session, increased LBP today) Assist level: Touching or steadying assistance (Pt > 75%)  Function - Comprehension Comprehension: Auditory Comprehension assist level: Follows complex conversation/direction with no assist  Function - Expression Expression: Verbal Expression assist level: Expresses complex ideas: With no assist  Function - Social Interaction Social Interaction assist level: Interacts appropriately with others - No medications needed.  Function - Problem Solving Problem solving assist level: Solves complex problems: With extra time  Function - Memory Memory assist level: More than reasonable amount of time Patient normally able to recall (first 3 days only): Current  season, Location of own room, Staff names and faces, That he or she is in a hospital  Medical Problem List and Plan:  1. Debilitated secondary to colitis/status post colectomy right abdominal proximal transverse colostomy 01/27/2017  CIR PT, OT   2. DVT Prophylaxis/Anticoagulation: SCDs. Monitor for any signs of DVT  3. Pain Management: Neurontin 100 mg every morning 400 mg daily at bedtime  Topical cream and add Kpad for CLBP 4. Mood: Provide emotional support  5. Neuropsych: This patient is capable of  making decisions on her own behalf.  6. Skin/Wound Care: Routine colostomy care  7. Fluids/Electrolytes/Nutrition: Routine I&O with follow-up chemistries  8.Hypertension. Coreg 6.25 mg twice a day  9. Diastolic congestive heart failure. Monitor for any signs of fluid overload  10. Cardiomyopathy with defibrillator. No chest pain or shortness of breath  11. CKD stage III. Follow-up chemistries upon admission , last K borderline elevated 7/17 will repeat prior to D/C 12. Diabetes mellitus and peripheral neuropathy. Lantus insulin 52 units twice a day,NovoLog 6 units 3 times a day. Check blood sugars before meals and at bedtime. Provide diabetic teaching  On Toujeo 100U per day at home will resume this upon D/C On Apidra 20U per day at home will resume this upon D/C CBG (last 3) occ spikes- question diet related pt has basket of snacks next to bed but denies eating them, will place on Carb mod diet to improve control  Recent Labs  02/14/17 1622 02/14/17 2043 02/15/17 0629  GLUCAP 135* 157* 118*    -SSI for brief prednisone treatment  13. Acute on chronic anemia. Follow-up CBC. Transfuse 2 units packed red blood cell 02/03/2017 Follow-up GI as needed. Continue PPI   15. OSA.CPAP. Singulair 10 mg daily  16. Gout. Uloric 40 mg daily. Acute flare of right toe---will give Colchicine .6mg  BID - just for flare hold uloric until d/c 17. Hyperlipidemia. Lovaza/Crestor  18. Hypothyroidism. Synthroid   LOS (Days) 9 A FACE TO FACE EVALUATION WAS PERFORMED  Tabithia Stroder E 02/15/2017, 8:08 AM

## 2017-02-15 NOTE — Progress Notes (Signed)
Social Work Discharge Note  The overall goal for the admission was met for:   Discharge location: Yes - pt's dtr's home (Zigmund Daniel)  Length of Stay: Yes - 9 days  Discharge activity level: Yes - supervision to mod I  Home/community participation: Yes  Services provided included: MD, RD, PT, OT, RN, Pharmacy, Neuropsych and SW  Financial Services: Medicare and Private Insurance: Customer service manager  Follow-up services arranged: Home Health: PT/OT/RN from [facility], DME: rolling walker and bedside commode from [facility] and Patient/Family has no preference for HH/DME agencies  Comments (or additional information):  Pt to d/c to her dtr's home. Dtr will assist pt as needed.  Pt met goals and was pleased with care.  Patient/Family verbalized understanding of follow-up arrangements: Yes  Individual responsible for coordination of the follow-up plan: pt and her dtr, Zigmund Daniel  Confirmed correct DME delivered: Trey Sailors 02/15/2017    Kimia Finan, Silvestre Mesi

## 2017-02-16 ENCOUNTER — Telehealth: Payer: Self-pay | Admitting: *Deleted

## 2017-02-16 NOTE — Telephone Encounter (Signed)
Transitional Care Call completed. Appointment confirmed, address confirmed, new patient packet sent  Transitional Care Questions   Questions for our staff to ask patients on Transitional care 48 hour phone call:   1. Are you/is patient experiencing any problems since coming home? No  Are there any questions regarding any aspect of care? No  2. Are there any questions regarding medications administration/dosing? No Are meds being taken as prescribed? Yes  Patient should review meds with caller to confirm   3. Have there been any falls? No  4. Has Home Health been to the house and/or have they contacted you? They have not reached out yet If not, have you tried to contact them? No Can we help you contact them?  No  5. Are bowels and bladder emptying properly?  Yes Are there any unexpected incontinence issues? No If applicable, is patient following bowel/bladder programs?   6. Any fevers, problems with breathing, unexpected pain?  No  7. Are there any skin problems or new areas of breakdown? Blisters around the colectomy site, wound care is managing and healing looks positive  8. Has the patient/family member arranged specialty MD follow up (ie cardiology/neurology/renal/surgical/etc)? Yes Can we help arrange? No  9. Does the patient need any other services or support that we can help arrange? No  10. Are caregivers following through as expected in assisting the patient? Yes, Patient surrounded by family members with medical training  11. Has the patient quit smoking, drinking alcohol, or using drugs as recommended? Patient does not drink, smoke or use illicit drugs

## 2017-02-17 DIAGNOSIS — D509 Iron deficiency anemia, unspecified: Secondary | ICD-10-CM | POA: Diagnosis not present

## 2017-02-17 DIAGNOSIS — E119 Type 2 diabetes mellitus without complications: Secondary | ICD-10-CM | POA: Diagnosis not present

## 2017-02-18 DIAGNOSIS — I130 Hypertensive heart and chronic kidney disease with heart failure and stage 1 through stage 4 chronic kidney disease, or unspecified chronic kidney disease: Secondary | ICD-10-CM | POA: Diagnosis not present

## 2017-02-18 DIAGNOSIS — N183 Chronic kidney disease, stage 3 (moderate): Secondary | ICD-10-CM | POA: Diagnosis not present

## 2017-02-18 DIAGNOSIS — Z794 Long term (current) use of insulin: Secondary | ICD-10-CM | POA: Diagnosis not present

## 2017-02-18 DIAGNOSIS — G4733 Obstructive sleep apnea (adult) (pediatric): Secondary | ICD-10-CM | POA: Diagnosis not present

## 2017-02-18 DIAGNOSIS — Z95810 Presence of automatic (implantable) cardiac defibrillator: Secondary | ICD-10-CM | POA: Diagnosis not present

## 2017-02-18 DIAGNOSIS — E785 Hyperlipidemia, unspecified: Secondary | ICD-10-CM | POA: Diagnosis not present

## 2017-02-18 DIAGNOSIS — I5032 Chronic diastolic (congestive) heart failure: Secondary | ICD-10-CM | POA: Diagnosis not present

## 2017-02-18 DIAGNOSIS — E1122 Type 2 diabetes mellitus with diabetic chronic kidney disease: Secondary | ICD-10-CM | POA: Diagnosis not present

## 2017-02-18 DIAGNOSIS — Z433 Encounter for attention to colostomy: Secondary | ICD-10-CM | POA: Diagnosis not present

## 2017-02-18 DIAGNOSIS — I429 Cardiomyopathy, unspecified: Secondary | ICD-10-CM | POA: Diagnosis not present

## 2017-02-18 DIAGNOSIS — Z87440 Personal history of urinary (tract) infections: Secondary | ICD-10-CM | POA: Diagnosis not present

## 2017-02-18 DIAGNOSIS — Z48815 Encounter for surgical aftercare following surgery on the digestive system: Secondary | ICD-10-CM | POA: Diagnosis not present

## 2017-02-18 DIAGNOSIS — M810 Age-related osteoporosis without current pathological fracture: Secondary | ICD-10-CM | POA: Diagnosis not present

## 2017-02-18 DIAGNOSIS — D649 Anemia, unspecified: Secondary | ICD-10-CM | POA: Diagnosis not present

## 2017-02-19 NOTE — Discharge Summary (Signed)
Discharge summary job # [id number]

## 2017-02-19 NOTE — Discharge Summary (Signed)
Crystal [name], Crystal [name]                ACCOUNT NO.:  [id number]  MEDICAL RECORD NO.:  [id number]  LOCATION:  4M09C                        FACILITY:  [facility]  PHYSICIAN:  Lauraine Rinne, P.A.  DATE OF BIRTH:  September 22, 1950  DATE OF ADMISSION:  02/06/2017 DATE OF DISCHARGE:  02/15/2017                              DISCHARGE SUMMARY   DISCHARGE DIAGNOSES: 1. Debilitation secondary to colitis status post colectomy with     colostomy January 27, 2017. 2. Pain management. 3. Hypertension. 4. Diastolic congestive heart failure. 5. Cardiomyopathy with defibrillator. 6. Chronic kidney disease stage 3. 7. Diabetes mellitus. 8. Peripheral neuropathy.  HISTORY OF PRESENT ILLNESS:  This is a 66 year old right-handed female with history of diabetes mellitus, nonischemic cardiomyopathy with defibrillator, CKD stage 3.  Lives in [place], [place]; independent, working part-time prior to admission.  She has a daughter with good support.  Presented to [facility] on [facility] 18, 2017, with severe right lower quadrant pain.  CT showed distended colon, presumed colitis.  The patient became more lethargic, hypotensive, was taken emergently to the operating room, found to have severe colitis, underwent colostomy and wound was left open.  Remained intubated.  HOSPITAL COURSE:  Septic shock, started on TPN, IV fluids.  Repeat CT of the abdomen January 21, 2017, showed mild free abdominal and pelvic fluid without evidence of abscess.  She was transferred to [facility] on January 26, 2017.  Followup CT abdomen and pelvis showed subtotal colectomy, right abdominal proximal transverse colostomy, small-to- moderate volume fluid in the peroneal cavity.  Wound Care nurse followup for wound care.  The patient was extubated on January 29, 2017, followed by Critical Care Medicine.  Blood cultures remained negative.  Maintained on broad-spectrum antibiotics.  Acute blood loss anemia at 8.1.  The patient with  episode of large black emesis, transfused 2 units of red blood cells.  Diet downgrade to clear liquids.  Gastroenterology consulted, advised conservative care.  Monitoring of leukocytosis, creatinine 2.96-1.54.  The patient with fungal UTI, initially placed on intravenous Rocephin.  Discussed with Infectious Disease.  Placed on intravenous anidulafungin.  Diet slowly advanced.  The patient was admitted for comprehensive rehab program.  PAST MEDICAL HISTORY:  See discharge diagnoses.  SOCIAL HISTORY:  Lives with daughter in [place], [place].  FUNCTIONAL STATUS:  Upon admission to rehab services was mod max assist supine to sit, moderate assist sit to supine, max total assist with activities of daily living.  PHYSICAL EXAMINATION:  VITAL SIGNS:  Blood pressure 118/66, pulse 93, temperature 99, respirations 18. GENERAL:  This was an alert female, in no acute distress, oriented x3. HEENT:  EOMs intact. NECK:  Supple.  Nontender.  No JVD. CARDIAC:  Rate controlled. ABDOMEN:  Soft, nontender.  Good bowel sounds. LUNGS:  Clear to auscultation without wheeze.  Wet-to-dry dressings in place to abdominal wound with wound packing.  REHABILITATION HOSPITAL COURSE:  The patient was admitted to inpatient rehab services with therapies initiated on a 3-hour daily basis, consisting of physical therapy, occupational therapy, and rehabilitation nursing.  The following issues were addressed during the patient's rehabilitation stay.  Pertaining to Mrs. Brabant' debilitation related to colitis, colectomy, colostomy, she continued  to participate with therapies and wound care ongoing as directed.  Pain Management with the use of Neurontin with good results.  Blood pressure is controlled with Coreg.  She exhibited no signs of fluid overload.  Noted history of cardiomyopathy with defibrillator.  CKD stage 3.  Creatinine stable, monitoring of potassium levels.  Diabetes mellitus,  peripheral neuropathy, Lantus insulin and NovoLog adjusted.  She had been on Toujeo per day at home prior to admission as well as Apidra that were resumed. Noted gout acute flare up right toe.  She was given Colchicine, good results.  She remained on hormone supplement for hypothyroidism.  The patient received weekly collaborative interdisciplinary team conferences to discuss estimated length of stay, family teaching, any barriers to her discharge.  Mobility performed at supervision to modified independent level working with energy conservation techniques.  She was discharged to her daughter's home.  She met 12/12 of her long-term goals for activity tolerance, improved balance and postural control as well as increased strength.  She could gather her belongings for activities of daily living and homemaking, completed bathing, dressing, sit to stand at the sink modified independent level.  She was discharged to home.  DISCHARGE MEDICATIONS:  At time of dictation included: 1. Coreg 6.25 mg p.o. b.i.d. 2. Colchicine 0.6 mg b.i.d. 3. Crestor 10 mg p.o. at bedtime. 4. Neurontin 100 mg in the morning, 400 at bedtime. 5. Lantus insulin 52 units twice daily. 6. Apidra 6 units t.i.d. with meals. 7. Synthroid 88 mcg p.o. daily. 8. Claritin 10 mg p.o. daily. 9. Singulair 10 mg p.o. at bedtime. 10.Multivitamin daily. 11.Lovaza 4 g p.o. daily. 12.Protonix 40 mg p.o. daily. 13.Uloric 40 mg p.o. daily.  DIET:  Diabetic diet.  FOLLOWUP:  The patient would follow up with Dr. Alysia Penna, at the outpatient rehab center as directed; Dr. Sherryl Manges, at [place], [place]; [place], DO, [place], [place].     Lauraine Rinne, P.A.     DA/MEDQ  D:  02/19/2017  T:  02/19/2017  Job:  [id number]  cc:   Sherryl Manges, MD Gildardo Cranker, DO Charlett Blake, M.D.

## 2017-02-20 DIAGNOSIS — N183 Chronic kidney disease, stage 3 (moderate): Secondary | ICD-10-CM | POA: Diagnosis not present

## 2017-02-20 DIAGNOSIS — Z433 Encounter for attention to colostomy: Secondary | ICD-10-CM | POA: Diagnosis not present

## 2017-02-20 DIAGNOSIS — I5032 Chronic diastolic (congestive) heart failure: Secondary | ICD-10-CM | POA: Diagnosis not present

## 2017-02-20 DIAGNOSIS — Z48815 Encounter for surgical aftercare following surgery on the digestive system: Secondary | ICD-10-CM | POA: Diagnosis not present

## 2017-02-20 DIAGNOSIS — I130 Hypertensive heart and chronic kidney disease with heart failure and stage 1 through stage 4 chronic kidney disease, or unspecified chronic kidney disease: Secondary | ICD-10-CM | POA: Diagnosis not present

## 2017-02-20 DIAGNOSIS — E1122 Type 2 diabetes mellitus with diabetic chronic kidney disease: Secondary | ICD-10-CM | POA: Diagnosis not present

## 2017-02-22 DIAGNOSIS — Z48815 Encounter for surgical aftercare following surgery on the digestive system: Secondary | ICD-10-CM | POA: Diagnosis not present

## 2017-02-22 DIAGNOSIS — E1122 Type 2 diabetes mellitus with diabetic chronic kidney disease: Secondary | ICD-10-CM | POA: Diagnosis not present

## 2017-02-22 DIAGNOSIS — I5032 Chronic diastolic (congestive) heart failure: Secondary | ICD-10-CM | POA: Diagnosis not present

## 2017-02-22 DIAGNOSIS — Z433 Encounter for attention to colostomy: Secondary | ICD-10-CM | POA: Diagnosis not present

## 2017-02-22 DIAGNOSIS — I130 Hypertensive heart and chronic kidney disease with heart failure and stage 1 through stage 4 chronic kidney disease, or unspecified chronic kidney disease: Secondary | ICD-10-CM | POA: Diagnosis not present

## 2017-02-22 DIAGNOSIS — N183 Chronic kidney disease, stage 3 (moderate): Secondary | ICD-10-CM | POA: Diagnosis not present

## 2017-02-23 ENCOUNTER — Encounter: Payer: Medicare Other | Admitting: Physical Medicine & Rehabilitation

## 2017-02-27 DIAGNOSIS — I130 Hypertensive heart and chronic kidney disease with heart failure and stage 1 through stage 4 chronic kidney disease, or unspecified chronic kidney disease: Secondary | ICD-10-CM | POA: Diagnosis not present

## 2017-02-27 DIAGNOSIS — Z433 Encounter for attention to colostomy: Secondary | ICD-10-CM | POA: Diagnosis not present

## 2017-02-27 DIAGNOSIS — I5032 Chronic diastolic (congestive) heart failure: Secondary | ICD-10-CM | POA: Diagnosis not present

## 2017-02-27 DIAGNOSIS — Z48815 Encounter for surgical aftercare following surgery on the digestive system: Secondary | ICD-10-CM | POA: Diagnosis not present

## 2017-02-27 DIAGNOSIS — E1122 Type 2 diabetes mellitus with diabetic chronic kidney disease: Secondary | ICD-10-CM | POA: Diagnosis not present

## 2017-02-27 DIAGNOSIS — N183 Chronic kidney disease, stage 3 (moderate): Secondary | ICD-10-CM | POA: Diagnosis not present

## 2017-03-01 DIAGNOSIS — Z48815 Encounter for surgical aftercare following surgery on the digestive system: Secondary | ICD-10-CM | POA: Diagnosis not present

## 2017-03-01 DIAGNOSIS — I130 Hypertensive heart and chronic kidney disease with heart failure and stage 1 through stage 4 chronic kidney disease, or unspecified chronic kidney disease: Secondary | ICD-10-CM | POA: Diagnosis not present

## 2017-03-01 DIAGNOSIS — N183 Chronic kidney disease, stage 3 (moderate): Secondary | ICD-10-CM | POA: Diagnosis not present

## 2017-03-01 DIAGNOSIS — Z433 Encounter for attention to colostomy: Secondary | ICD-10-CM | POA: Diagnosis not present

## 2017-03-01 DIAGNOSIS — E1122 Type 2 diabetes mellitus with diabetic chronic kidney disease: Secondary | ICD-10-CM | POA: Diagnosis not present

## 2017-03-01 DIAGNOSIS — I5032 Chronic diastolic (congestive) heart failure: Secondary | ICD-10-CM | POA: Diagnosis not present

## 2017-03-02 DIAGNOSIS — I5032 Chronic diastolic (congestive) heart failure: Secondary | ICD-10-CM | POA: Diagnosis not present

## 2017-03-02 DIAGNOSIS — Z48815 Encounter for surgical aftercare following surgery on the digestive system: Secondary | ICD-10-CM | POA: Diagnosis not present

## 2017-03-02 DIAGNOSIS — N183 Chronic kidney disease, stage 3 (moderate): Secondary | ICD-10-CM | POA: Diagnosis not present

## 2017-03-02 DIAGNOSIS — Z433 Encounter for attention to colostomy: Secondary | ICD-10-CM | POA: Diagnosis not present

## 2017-03-02 DIAGNOSIS — I130 Hypertensive heart and chronic kidney disease with heart failure and stage 1 through stage 4 chronic kidney disease, or unspecified chronic kidney disease: Secondary | ICD-10-CM | POA: Diagnosis not present

## 2017-03-02 DIAGNOSIS — E1122 Type 2 diabetes mellitus with diabetic chronic kidney disease: Secondary | ICD-10-CM | POA: Diagnosis not present

## 2017-03-03 DIAGNOSIS — E1122 Type 2 diabetes mellitus with diabetic chronic kidney disease: Secondary | ICD-10-CM | POA: Diagnosis not present

## 2017-03-03 DIAGNOSIS — I130 Hypertensive heart and chronic kidney disease with heart failure and stage 1 through stage 4 chronic kidney disease, or unspecified chronic kidney disease: Secondary | ICD-10-CM | POA: Diagnosis not present

## 2017-03-03 DIAGNOSIS — E785 Hyperlipidemia, unspecified: Secondary | ICD-10-CM | POA: Diagnosis not present

## 2017-03-03 DIAGNOSIS — D649 Anemia, unspecified: Secondary | ICD-10-CM | POA: Diagnosis not present

## 2017-03-03 DIAGNOSIS — M810 Age-related osteoporosis without current pathological fracture: Secondary | ICD-10-CM | POA: Diagnosis not present

## 2017-03-03 DIAGNOSIS — I429 Cardiomyopathy, unspecified: Secondary | ICD-10-CM | POA: Diagnosis not present

## 2017-03-03 DIAGNOSIS — Z48815 Encounter for surgical aftercare following surgery on the digestive system: Secondary | ICD-10-CM | POA: Diagnosis not present

## 2017-03-03 DIAGNOSIS — Z933 Colostomy status: Secondary | ICD-10-CM | POA: Diagnosis not present

## 2017-03-03 DIAGNOSIS — Z433 Encounter for attention to colostomy: Secondary | ICD-10-CM | POA: Diagnosis not present

## 2017-03-03 DIAGNOSIS — G4733 Obstructive sleep apnea (adult) (pediatric): Secondary | ICD-10-CM | POA: Diagnosis not present

## 2017-03-03 DIAGNOSIS — I5032 Chronic diastolic (congestive) heart failure: Secondary | ICD-10-CM | POA: Diagnosis not present

## 2017-03-03 DIAGNOSIS — N183 Chronic kidney disease, stage 3 (moderate): Secondary | ICD-10-CM | POA: Diagnosis not present

## 2017-03-06 DIAGNOSIS — Z433 Encounter for attention to colostomy: Secondary | ICD-10-CM | POA: Diagnosis not present

## 2017-03-06 DIAGNOSIS — I5032 Chronic diastolic (congestive) heart failure: Secondary | ICD-10-CM | POA: Diagnosis not present

## 2017-03-06 DIAGNOSIS — E1122 Type 2 diabetes mellitus with diabetic chronic kidney disease: Secondary | ICD-10-CM | POA: Diagnosis not present

## 2017-03-06 DIAGNOSIS — I130 Hypertensive heart and chronic kidney disease with heart failure and stage 1 through stage 4 chronic kidney disease, or unspecified chronic kidney disease: Secondary | ICD-10-CM | POA: Diagnosis not present

## 2017-03-06 DIAGNOSIS — Z48815 Encounter for surgical aftercare following surgery on the digestive system: Secondary | ICD-10-CM | POA: Diagnosis not present

## 2017-03-06 DIAGNOSIS — N183 Chronic kidney disease, stage 3 (moderate): Secondary | ICD-10-CM | POA: Diagnosis not present

## 2017-03-08 DIAGNOSIS — E1122 Type 2 diabetes mellitus with diabetic chronic kidney disease: Secondary | ICD-10-CM | POA: Diagnosis not present

## 2017-03-08 DIAGNOSIS — I130 Hypertensive heart and chronic kidney disease with heart failure and stage 1 through stage 4 chronic kidney disease, or unspecified chronic kidney disease: Secondary | ICD-10-CM | POA: Diagnosis not present

## 2017-03-08 DIAGNOSIS — I5032 Chronic diastolic (congestive) heart failure: Secondary | ICD-10-CM | POA: Diagnosis not present

## 2017-03-08 DIAGNOSIS — N183 Chronic kidney disease, stage 3 (moderate): Secondary | ICD-10-CM | POA: Diagnosis not present

## 2017-03-08 DIAGNOSIS — Z48815 Encounter for surgical aftercare following surgery on the digestive system: Secondary | ICD-10-CM | POA: Diagnosis not present

## 2017-03-08 DIAGNOSIS — Z433 Encounter for attention to colostomy: Secondary | ICD-10-CM | POA: Diagnosis not present

## 2017-03-09 ENCOUNTER — Telehealth: Payer: Self-pay

## 2017-03-09 DIAGNOSIS — E1122 Type 2 diabetes mellitus with diabetic chronic kidney disease: Secondary | ICD-10-CM | POA: Diagnosis not present

## 2017-03-09 DIAGNOSIS — I5032 Chronic diastolic (congestive) heart failure: Secondary | ICD-10-CM | POA: Diagnosis not present

## 2017-03-09 DIAGNOSIS — Z433 Encounter for attention to colostomy: Secondary | ICD-10-CM | POA: Diagnosis not present

## 2017-03-09 DIAGNOSIS — N183 Chronic kidney disease, stage 3 (moderate): Secondary | ICD-10-CM | POA: Diagnosis not present

## 2017-03-09 DIAGNOSIS — Z48815 Encounter for surgical aftercare following surgery on the digestive system: Secondary | ICD-10-CM | POA: Diagnosis not present

## 2017-03-09 DIAGNOSIS — I130 Hypertensive heart and chronic kidney disease with heart failure and stage 1 through stage 4 chronic kidney disease, or unspecified chronic kidney disease: Secondary | ICD-10-CM | POA: Diagnosis not present

## 2017-03-09 NOTE — Telephone Encounter (Signed)
Patient has an appointment with Dr. Letta Pate on 03/13/2017, hospital follow up.  Pt lives in [place], [place] according to [facility].  I contacted Lenell Antu, PT, [facility] and confirmed that patient is staying with daughter in [place]. I contacted the patient's daughter and they are confirmed to see Dr. Letta Pate on Monday the 20th of August. I advised patient's daughter that this is the best opportunity to address current issues

## 2017-03-09 NOTE — Telephone Encounter (Signed)
I'm not sure whether we scheduled this patient for a follow-up with me. She lives in [place]. I would ask the home health PT to contact the patient's primary care physician

## 2017-03-09 NOTE — Telephone Encounter (Signed)
Crystal [name] PT [facility] called today about this patient, states patient is complaining about large bouts of pain from the sciatic region of her left leg and states it is traveling down entire leg and into foot preventing her from doing physical therapy, states ice/heat and stretching has no effect on alleviating the pain, wanting to know if anything can be done for this patien pain wise.  Please advise

## 2017-03-13 ENCOUNTER — Encounter: Payer: Medicare Other | Attending: Physical Medicine & Rehabilitation

## 2017-03-13 ENCOUNTER — Other Ambulatory Visit: Payer: Self-pay | Admitting: Physical Medicine & Rehabilitation

## 2017-03-13 ENCOUNTER — Ambulatory Visit (HOSPITAL_BASED_OUTPATIENT_CLINIC_OR_DEPARTMENT_OTHER): Payer: Medicare Other | Admitting: Physical Medicine & Rehabilitation

## 2017-03-13 ENCOUNTER — Encounter: Payer: Self-pay | Admitting: Physical Medicine & Rehabilitation

## 2017-03-13 ENCOUNTER — Ambulatory Visit
Admission: RE | Admit: 2017-03-13 | Discharge: 2017-03-13 | Disposition: A | Discharge status: Home / Self Care | Payer: Medicare Other | Source: Ambulatory Visit | Attending: Physical Medicine & Rehabilitation | Admitting: Physical Medicine & Rehabilitation

## 2017-03-13 VITALS — BP 156/81 | HR 88 | Resp 88

## 2017-03-13 DIAGNOSIS — I130 Hypertensive heart and chronic kidney disease with heart failure and stage 1 through stage 4 chronic kidney disease, or unspecified chronic kidney disease: Secondary | ICD-10-CM | POA: Diagnosis not present

## 2017-03-13 DIAGNOSIS — M5441 Lumbago with sciatica, right side: Secondary | ICD-10-CM | POA: Diagnosis not present

## 2017-03-13 DIAGNOSIS — R5381 Other malaise: Secondary | ICD-10-CM | POA: Insufficient documentation

## 2017-03-13 DIAGNOSIS — M25531 Pain in right wrist: Secondary | ICD-10-CM

## 2017-03-13 DIAGNOSIS — M25532 Pain in left wrist: Principal | ICD-10-CM

## 2017-03-13 DIAGNOSIS — I509 Heart failure, unspecified: Secondary | ICD-10-CM | POA: Insufficient documentation

## 2017-03-13 DIAGNOSIS — M5442 Lumbago with sciatica, left side: Secondary | ICD-10-CM

## 2017-03-13 DIAGNOSIS — I429 Cardiomyopathy, unspecified: Secondary | ICD-10-CM | POA: Insufficient documentation

## 2017-03-13 DIAGNOSIS — D509 Iron deficiency anemia, unspecified: Secondary | ICD-10-CM | POA: Insufficient documentation

## 2017-03-13 DIAGNOSIS — M545 Low back pain: Secondary | ICD-10-CM | POA: Insufficient documentation

## 2017-03-13 DIAGNOSIS — N183 Chronic kidney disease, stage 3 (moderate): Secondary | ICD-10-CM | POA: Diagnosis not present

## 2017-03-13 DIAGNOSIS — Z933 Colostomy status: Secondary | ICD-10-CM | POA: Insufficient documentation

## 2017-03-13 DIAGNOSIS — Z95810 Presence of automatic (implantable) cardiac defibrillator: Secondary | ICD-10-CM | POA: Diagnosis not present

## 2017-03-13 DIAGNOSIS — E1122 Type 2 diabetes mellitus with diabetic chronic kidney disease: Secondary | ICD-10-CM | POA: Diagnosis not present

## 2017-03-13 DIAGNOSIS — G8929 Other chronic pain: Secondary | ICD-10-CM

## 2017-03-13 MED ORDER — TRAMADOL HCL 50 MG PO TABS: 100.0000 mg | ORAL_TABLET | Freq: Two times a day (BID) | ORAL | 5 refills | Status: DC

## 2017-03-13 NOTE — Patient Instructions (Signed)
If status does not improve, may need wrist injections

## 2017-03-13 NOTE — Progress Notes (Signed)
Subjective:    Patient ID: Crystal [name], female    DOB: 1950/11/02, 66 y.o.   MRN: [id number]   Follow-up after rehabilitation hospitalization DATE OF ADMISSION:  02/06/2017 DATE OF DISCHARGE:  02/15/2017 66 year old right-handed female with history of diabetes mellitus, nonischemic cardiomyopathy with defibrillator, CKD stage 3.  Lives in [place], [place]; independent, working part-time prior to admission.  She has a daughter with good support.  Presented to [facility] on January 18, 2017, with severe right lower quadrant pain.  CT showed distended colon, presumed colitis.  The patient became more lethargic, hypotensive, was taken emergently to the operating room, found to have severe colitis, underwent colostomy and wound was left open.   HPI Receiving home health services for another week or so  Doreen Salvage is new Archivist, planning colostomy reversal in 3 mo Dr Gildardo Cranker is new internist/geriatrician  Mod I dressing and bathing,  Mod I colostomy management  No need for driving at this point living with daughter  Patient with a history of chronic low back pain, has had a CT myelogram in the past showing some disc bulging. Also has wrist pain in both wrists, history of carpal tunnel release on the left side, right side used to have some finger numbness when she was driving. The carpal tunnel release was not very helpful for her symptoms, however.  Still has open incision abd plus colostomy Pain Inventory Average Pain 6 Pain Right Now 6 My pain is dull  In the last 24 hours, has pain interfered with the following? General activity 3 Relation with others 3 Enjoyment of life 0 What TIME of day is your pain at its worst? night Sleep (in general) Good  Pain is worse with: walking Pain improves with: rest and medication Relief from Meds: 8  Mobility walk without assistance walk with assistance use a walker how many minutes can you walk?  ? ability to climb steps?  yes do you drive?  no Do you have any goals in this area?  yes  Function not employed: date last employed . Do you have any goals in this area?  no  Neuro/Psych No problems in this area  Prior Studies hospital f/u  Physicians involved in your care hospital f/u   Family History  Problem Relation Age of Onset  . Hypertension Father   . Heart disease Father   . Cancer Father        prostate  . Heart disease Other        (Maternal side) Ischemic heart disease  . Diabetes Mellitus II Other   . Arrhythmia Mother   . Diabetes Mellitus II Mother        Borderline DM  . Hypertension Mother    Social History   Social History  . Marital status: Widowed    Spouse name: N/A  . Number of children: N/A  . Years of education: N/A   Occupational History  .      retired Corporate treasurer   Social History Main Topics  . Smoking status: Never Smoker  . Smokeless tobacco: Never Used  . Alcohol use No  . Drug use: No  . Sexual activity: Not Asked   Other Topics Concern  . None   Social History Narrative   Pt lives in [place] alone.  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   Past Surgical History:  Procedure Laterality Date  . CARDIAC CATHETERIZATION  2005   no obstructive CAD per patient  . CARDIAC DEFIBRILLATOR PLACEMENT  2005   BiV ICD implanted,  LV lead is an epicardial lead  . CYST REMOVAL HAND Right 05/2013   thumb  . IMPLANTABLE CARDIOVERTER DEFIBRILLATOR GENERATOR CHANGE  2008  . TOTAL ABDOMINAL HYSTERECTOMY    . TRIGGER FINGER RELEASE  05/213  . VESICOVAGINAL FISTULA CLOSURE W/ TAH     Past Medical History:  Diagnosis Date  . Anemia, iron deficiency   . Angioedema    felt to likely be due to ace inhibitors but says she has had this even off of medicines, appears to be tolerating ARBs chronically  . Automatic implantable cardiac defibrillator in situ    high RV threshold chronically, device  was turned off in 2014  . Back pain   . CHF (congestive heart failure) ([place])   . Chronic renal insufficiency   . Diabetes mellitus, type 2 ([place])   . Gout   . Hyperlipidemia   . Hypertension   . Intervertebral disc degeneration   . LBBB (left bundle branch block)   . Nonischemic cardiomyopathy ([place])    s/p ICD in 2005. EF has since recovered  . Obesity   . Systemic hypertension   . Unspecified hypothyroidism    BP (!) 156/81 (BP Location: Left Arm, Patient Position: Sitting, Cuff Size: Normal)   Pulse 88   Resp (!) 88   SpO2 92%   Opioid Risk Score:   Fall Risk Score:  `1  Depression screen PHQ 2/9  No flowsheet data found.  Review of Systems  Constitutional: Negative.   HENT: Negative.   Eyes: Negative.   Respiratory: Positive for apnea.   Cardiovascular: Negative.   Gastrointestinal: Negative.   Endocrine: Negative.   Genitourinary: Negative.   Musculoskeletal: Positive for gait problem.  Skin: Negative.   Allergic/Immunologic: Negative.   Hematological: Negative.   Psychiatric/Behavioral: Negative.        Objective:   Physical Exam  Constitutional: She is oriented to person, place, and time. She appears well-developed and well-nourished.  HENT:  Head: Normocephalic and atraumatic.  Eyes: Pupils are equal, round, and reactive to light. Conjunctivae and EOM are normal.  Neck: Normal range of motion.  Neurological: She is alert and oriented to person, place, and time.  Psychiatric: She has a normal mood and affect.  Nursing note and vitals reviewed.  Motor strength is 5/5 bilateral deltoid, biceps, triceps, grip, hip flexor, knee extensor, ankle dorsiflexion. Lumbar spine has no tenderness. Palpation in the lumbosacral junction. There is tenderness at the left PSIS area. Negative straight leg raising. Right wrist. No evidence of swelling, there is mild dorsal joint tenderness, No evidence of erythema, no limitation with range of motion  Left wrist healed  incision over the distal wrist crease area midline, no evidence of joint swelling in the wrist joint. No tenderness over the tendons. No erythema. She has good range of motion at the wrist.       Assessment & Plan:  1. Debility after colostomy, has made excellent improvements. Now modified independent level but still has not regained her endurance. She will continue home health, at this point, I do not think she'll need outpatient therapy, but I will reevaluate her after a month and if she is still complaining of very poor endurance. She may benefit from some PT at least  2. Chronic low back pain. She's had some worsening recently due to her immobility. It may be more of a sacroiliac  issue, although she does have a prior history of degenerative disc disease. No significant sciatic symptoms noted, no clinical signs of radiculopathy. Will start tramadol 100 mg twice a day follow-up in one month. May benefit from outpatient PT to address this  3. Chronic wrist pain. Exam is unremarkable. 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 If the tramadol does not address the wrist pain, may consider wrist injection

## 2017-03-15 ENCOUNTER — Encounter: Payer: Self-pay | Admitting: Internal Medicine

## 2017-03-15 ENCOUNTER — Ambulatory Visit (INDEPENDENT_AMBULATORY_CARE_PROVIDER_SITE_OTHER): Payer: Medicare Other | Admitting: Internal Medicine

## 2017-03-15 VITALS — BP 136/84 | HR 82 | Temp 98.4°F | Resp 17 | Ht 66.0 in | Wt 213.0 lb

## 2017-03-15 DIAGNOSIS — I5032 Chronic diastolic (congestive) heart failure: Secondary | ICD-10-CM | POA: Diagnosis not present

## 2017-03-15 DIAGNOSIS — N183 Chronic kidney disease, stage 3 unspecified: Secondary | ICD-10-CM

## 2017-03-15 DIAGNOSIS — E782 Mixed hyperlipidemia: Secondary | ICD-10-CM | POA: Diagnosis not present

## 2017-03-15 DIAGNOSIS — I10 Essential (primary) hypertension: Secondary | ICD-10-CM | POA: Diagnosis not present

## 2017-03-15 DIAGNOSIS — I5022 Chronic systolic (congestive) heart failure: Secondary | ICD-10-CM | POA: Diagnosis not present

## 2017-03-15 DIAGNOSIS — D649 Anemia, unspecified: Secondary | ICD-10-CM | POA: Diagnosis not present

## 2017-03-15 DIAGNOSIS — Z433 Encounter for attention to colostomy: Secondary | ICD-10-CM | POA: Diagnosis not present

## 2017-03-15 DIAGNOSIS — E034 Atrophy of thyroid (acquired): Secondary | ICD-10-CM | POA: Diagnosis not present

## 2017-03-15 DIAGNOSIS — Z48815 Encounter for surgical aftercare following surgery on the digestive system: Secondary | ICD-10-CM | POA: Diagnosis not present

## 2017-03-15 DIAGNOSIS — E1122 Type 2 diabetes mellitus with diabetic chronic kidney disease: Secondary | ICD-10-CM

## 2017-03-15 DIAGNOSIS — I130 Hypertensive heart and chronic kidney disease with heart failure and stage 1 through stage 4 chronic kidney disease, or unspecified chronic kidney disease: Secondary | ICD-10-CM | POA: Diagnosis not present

## 2017-03-15 DIAGNOSIS — Z933 Colostomy status: Secondary | ICD-10-CM | POA: Diagnosis not present

## 2017-03-15 DIAGNOSIS — Z794 Long term (current) use of insulin: Secondary | ICD-10-CM

## 2017-03-15 LAB — CBC WITH DIFFERENTIAL/PLATELET
BASOS ABS: 0 {cells}/uL (ref 0–200)
BASOS PCT: 0 %
EOS ABS: 395 {cells}/uL (ref 15–500)
Eosinophils Relative: 5 %
HCT: 32.1 % — ABNORMAL LOW (ref 35.0–45.0)
HEMOGLOBIN: 10.1 g/dL — AB (ref 11.7–15.5)
LYMPHS ABS: 2449 {cells}/uL (ref 850–3900)
Lymphocytes Relative: 31 %
MCH: 26.9 pg — AB (ref 27.0–33.0)
MCHC: 31.5 g/dL — ABNORMAL LOW (ref 32.0–36.0)
MCV: 85.6 fL (ref 80.0–100.0)
MONO ABS: 790 {cells}/uL (ref 200–950)
MONOS PCT: 10 %
MPV: 10 fL (ref 7.5–12.5)
NEUTROS ABS: 4266 {cells}/uL (ref 1500–7800)
Neutrophils Relative %: 54 %
PLATELETS: 278 10*3/uL (ref 140–400)
RBC: 3.75 MIL/uL — ABNORMAL LOW (ref 3.80–5.10)
RDW: 16 % — ABNORMAL HIGH (ref 11.0–15.0)
WBC: 7.9 10*3/uL (ref 3.8–10.8)

## 2017-03-15 LAB — TSH: TSH: 3.3 mIU/L

## 2017-03-15 LAB — T4, FREE: FREE T4: 1.3 ng/dL (ref 0.8–1.8)

## 2017-03-15 MED ORDER — TELMISARTAN 20 MG PO TABS: 20.0000 mg | ORAL_TABLET | Freq: Every day | ORAL | 3 refills | Status: DC

## 2017-03-15 NOTE — Progress Notes (Signed)
Patient ID: Crystal [name], female   DOB: 09/28/1950, 66 y.o.   MRN: [id number]    Location:  [facility] Place of Service: OFFICE    Advanced Directive information Does Patient Have a Medical Advance Directive?: Yes, Type of Advance Directive: Healthcare Power of Attorney  Chief Complaint  Patient presents with  . Medical Management of Chronic Issues    Pt is being seen to establish care. Pt was previously seen by provider in [place].   . Other    Daughter and grandson in room     HPI:  66 yo female seen today as a new pt. She was admitted to the hospital in July for abdominal pain. 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 She is followed by sx Dr Hulen Skains. Plan to reverse colostomy in 3 mos. No leakage around colostomy. She noticed more formed stool lately. She has hx constipation and has taken linzess in the past. Albumin 3.2   She has relocated from [place], [place] and plans to live in [place] near her daughter. She is a retired HD nurse  HTN - BP stable on coreg. She was on valsartan prior to hospital stay but med stopped 2/2 hypotension.   CHF - she has had an AICD in the past but EF improved from <30% --> EF 45-50% with mild-mod LVH and grade 1 DD in 2016/04/20. Battery in AICD died and she was told it was not necessary to replace it due to improved EF. She underwent lexiscan stress test, cardiac cath and 2D echo this yr. She takes coreg  Hyperlipidemia - stable on crestor. No myalgias. No recent LDL in EPIC  Insomnia - she takes prn restoril 36m   OSA - on CPAP with 2L [place] O2 qHS. 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 A1c was as high as 9% but she checked at her old job and it was down to 6%. No low BS reactions. She takes toujeo, apidra. Gabapentin controls neuropathy. She has stage 3 kidney disease. She has allergy to ACEI but can tolerate ARB.  Anemia - rec'd 5 units PRBCs. Last Hgb 9. She has a hx iron deficiency and is  taking iron supplement.  CKD - stage 3. Cr  1.34. She has seen nephrology in the past.  Gout - no recent flares. She takes uloric.  Hypothyroidism - no sx's. She takes levothyroxine.  GERD - stable on protonix  Past Medical History:  Diagnosis Date  . Anemia, iron deficiency   . Angioedema    felt to likely be due to ace inhibitors but says she has had this even off of medicines, appears to be tolerating ARBs chronically  . Automatic implantable cardiac defibrillator in situ    high RV threshold chronically, device was turned off in 2014  . Back pain   . CHF (congestive heart failure) ([facility]   . Chronic renal insufficiency   . Diabetes mellitus, type 2 ([facility]   . Gout   . Hyperlipidemia   . Hypertension   . Hypothyroid   . Intervertebral disc degeneration   . Kidney failure    Stage 3  . LBBB (left bundle branch block)   . Neuropathy   . Nonischemic cardiomyopathy ([facility]    s/p ICD in 2005. EF has since recovered  . Obesity   . Systemic hypertension   . Unspecified hypothyroidism     Past Surgical History:  Procedure Laterality Date  . CARDIAC  CATHETERIZATION  2005   no obstructive CAD per patient  . CARDIAC DEFIBRILLATOR PLACEMENT  2005   BiV ICD implanted,  LV lead is an epicardial lead  . CARPAL TUNNEL RELEASE Left 07/25/2014   Dr.Williamson   . COLONOSCOPY     2010-2011 Dr.Kipreos   . CYST REMOVAL HAND Right 05/2013   thumb  . IMPLANTABLE CARDIOVERTER DEFIBRILLATOR GENERATOR CHANGE  2008  . TOTAL ABDOMINAL HYSTERECTOMY    . TRIGGER FINGER RELEASE  05/213  . VESICOVAGINAL FISTULA CLOSURE W/ TAH      Patient Care Team: Gildardo Cranker, DO as PCP - General (Internal Medicine) Phineas Inches, MD as Consulting Physician (Nephrology) Judeth Horn, MD as Consulting Physician (General Surgery)  Social History   Social History  . Marital status: Widowed    Spouse name: N/A  . Number of children: N/A  . Years of education: N/A   Occupational History  .       retired Corporate treasurer   Social History Main Topics  . Smoking status: Never Smoker  . Smokeless tobacco: Never Used  . Alcohol use No  . Drug use: No  . Sexual activity: No   Other Topics Concern  . Not on file   Social History Narrative   Pt lives in [place] alone.  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      As of 03/15/17:   Diet: 1800 Calorie      Caffeine: Yes      Married, if yes what year: Widowed, married 1973      Do you live in a house, apartment, assisted living, condo, trailer, ect: House, 1 stories, and 1 person      Pets: No      Current/Past profession: LPN, retired       Exercise: Yes, walking          Living Will: Yes   DNR: No   POA/HPOA: Yes      Functional Status:   Do you have difficulty bathing or dressing yourself? No   Do you have difficulty preparing food or eating? No   Do you have difficulty managing your medications? No   Do you have difficulty managing your finances? No   Do you have difficulty affording your medications? Yes     reports that she has never smoked. She has never used smokeless tobacco. She reports that she does not drink alcohol or use drugs.  Family History  Problem Relation Age of Onset  . Hypertension Father   . Heart disease Father   . Cancer Father        prostate  . Heart disease Other        (Maternal side) Ischemic heart disease  . Diabetes Mellitus II Other   . Arrhythmia Mother   . Diabetes Mellitus II Mother        Borderline DM  . Hypertension Mother   . Asthma Mother   . Cancer Brother   . Dementia Brother   . Hypertension Brother   . Hypertension Daughter   . Hypertension Daughter   . Diabetes Daughter   . Hypertension Daughter   . Atrial fibrillation Daughter   . GER disease Daughter   . Hypertension Son   . Anxiety disorder Son   . Hypothyroidism Son    Family Status  Relation Status  . Father Deceased  . Other Alive  . Mother Alive  .  Brother Deceased  .  Brother Alive  . Daughter Alive  . Daughter Alive  . Daughter Alive  . Daughter Alive  . Son Alive    Immunization History  Administered Date(s) Administered  . Influenza-Unspecified 05/11/2016  . Pneumococcal Conjugate-13 11/17/2011  . Pneumococcal Polysaccharide-23 05/07/2004  . Td 09/19/2001  . Zoster 05/24/2012    Allergies  Allergen Reactions  . Glucophage [Metformin Hcl] Other (See Comments)    Renal failure  . Ace Inhibitors Swelling and Other (See Comments)    Angioedema  . Advicor [Niacin-Lovastatin Er] Other (See Comments)    Muscle aches  . Bystolic [Nebivolol Hcl] Other (See Comments)    Edema  . Erythromycin Diarrhea, Nausea And Vomiting and Other (See Comments)    *DERIVATIVES*  . Lipitor [Atorvastatin] Other (See Comments)    Muscle aches  . Lopid [Gemfibrozil] Other (See Comments)    Muscle aches  . Statins Other (See Comments)    Muscle aches  . Adhesive [Tape] Rash    Medications: Patient's Medications  New Prescriptions   No medications on file  Previous Medications   ACETAMINOPHEN (TYLENOL) 500 MG TABLET    Take 500 mg by mouth as needed. Usually once weekly   CARVEDILOL (COREG) 6.25 MG TABLET    Take 1 tablet (6.25 mg total) by mouth 2 (two) times daily with a meal.   CRESTOR 10 MG TABLET    Take 1 tablet (10 mg total) by mouth at bedtime.   FERROUS SULFATE 325 (65 FE) MG EC TABLET    Take 325 mg by mouth 2 (two) times daily.   GABAPENTIN (NEURONTIN) 100 MG CAPSULE    Take 100-400 mg by mouth See admin instructions. Take 100 mg by mouth in the morning and take 400 mg by mouth at bedtime   INSULIN GLARGINE (TOUJEO SOLOSTAR) 300 UNIT/ML SOPN    Inject 52 Units into the skin 2 (two) times daily.   INSULIN GLULISINE (APIDRA) 100 UNIT/ML INJECTION    Inject 0.06 mLs (6 Units total) into the skin 3 (three) times daily with meals.   LEVOTHYROXINE (SYNTHROID, LEVOTHROID) 88 MCG TABLET    Take 88 mcg by mouth daily.    LORATADINE  (CLARITIN) 10 MG TABLET    Take 10 mg by mouth daily.   MENTHOL-METHYL SALICYLATE (MUSCLE RUB) 10-15 % CREA    Apply 1 application topically 2 (two) times daily as needed for muscle pain.   MONTELUKAST (SINGULAIR) 10 MG TABLET    Take 10 mg by mouth at bedtime.   MULTIPLE VITAMIN (MULTIVITAMIN) TABLET    Take 1 tablet by mouth daily.   NYSTATIN (MYCOSTATIN/NYSTOP) POWDER    Apply topically 3 (three) times daily. Apply to rash under abdominal folds and right armpit.   OMEGA-3 ACID ETHYL ESTERS (LOVAZA) 1 G CAPSULE    Take 4 g by mouth daily.   PANTOPRAZOLE (PROTONIX) 40 MG TABLET    Take 1 tablet (40 mg total) by mouth daily.   TRAMADOL (ULTRAM) 50 MG TABLET    Take 2 tablets (100 mg total) by mouth 2 (two) times daily.   ULORIC 40 MG TABLET    Take 40 mg by mouth daily.   Modified Medications   No medications on file  Discontinued Medications   No medications on file    Review of Systems  HENT: Positive for dental problem (wears dentures).   Musculoskeletal: Positive for arthralgias and back pain.  Neurological: Positive for weakness.  All other systems reviewed and are negative.   Vitals:  03/15/17 1104  BP: 136/84  Pulse: 82  Resp: 17  Temp: 98.4 F (36.9 C)  TempSrc: Oral  SpO2: 94%  Weight: 213 lb (96.6 kg)  Height: _0  (1.676 m)   Body mass index is 34.38 kg/m.  Physical Exam  Constitutional: She is oriented to person, place, and time. She appears well-developed and well-nourished.  HENT:  Mouth/Throat: Oropharynx is clear and moist. No oropharyngeal exudate.  MMM; no oral thrush  Eyes: Pupils are equal, round, and reactive to light. No scleral icterus.  Neck: Neck supple. Carotid bruit is not present. No tracheal deviation present. No thyromegaly present.  Cardiovascular: Normal rate, regular rhythm, normal heart sounds and intact distal pulses.  Exam reveals no gallop and no friction rub.   No murmur heard. No LE edema b/l. no calf TTP.   Pulmonary/Chest:  Effort normal and breath sounds normal. No stridor. No respiratory distress. She has no wheezes. She has no rales.  Abdominal: Soft. Normal appearance and bowel sounds are normal. She exhibits distension. She exhibits no mass. There is no hepatomegaly. There is tenderness. There is no rigidity, no rebound and no guarding. No hernia.  Midline incision without signs of infection. No purulent d/c. Min bleeding. No redness. No wound dehiscence; RLQ colostomy intact with no bloody stool  Musculoskeletal: She exhibits edema.  Lymphadenopathy:    She has no cervical adenopathy.  Neurological: She is alert and oriented to person, place, and time.  Skin: Skin is warm and dry. No rash noted.  Psychiatric: She has a normal mood and affect. Her behavior is normal. Judgment and thought content normal.     Labs reviewed: Admission on 02/06/2017, Discharged on 02/15/2017  Component Date Value Ref Range Status  . WBC 02/07/2017 8.1  4.0 - 10.5 K/uL Final  . RBC 02/07/2017 3.08* 3.87 - 5.11 MIL/uL Final  . Hemoglobin 02/07/2017 8.4* 12.0 - 15.0 g/dL Final  . HCT 02/07/2017 26.3* 36.0 - 46.0 % Final  . MCV 02/07/2017 85.4  78.0 - 100.0 fL Final  . MCH 02/07/2017 27.3  26.0 - 34.0 pg Final  . MCHC 02/07/2017 31.9  30.0 - 36.0 g/dL Final  . RDW 02/07/2017 16.1* 11.5 - 15.5 % Final  . Platelets 02/07/2017 326  150 - 400 K/uL Final  . Neutrophils Relative % 02/07/2017 74  % Final  . Neutro Abs 02/07/2017 6.1  1.7 - 7.7 K/uL Final  . Lymphocytes Relative 02/07/2017 16  % Final  . Lymphs Abs 02/07/2017 1.3  0.7 - 4.0 K/uL Final  . Monocytes Relative 02/07/2017 8  % Final  . Monocytes Absolute 02/07/2017 0.6  0.1 - 1.0 K/uL Final  . Eosinophils Relative 02/07/2017 1  % Final  . Eosinophils Absolute 02/07/2017 0.1  0.0 - 0.7 K/uL Final  . Basophils Relative 02/07/2017 1  % Final  . Basophils Absolute 02/07/2017 0.0  0.0 - 0.1 K/uL Final  . Sodium 02/07/2017 136  135 - 145 mmol/L Final  . Potassium  02/07/2017 5.3* 3.5 - 5.1 mmol/L Final   NO VISIBLE HEMOLYSIS  . Chloride 02/07/2017 109  101 - 111 mmol/L Final  . CO2 02/07/2017 20* 22 - 32 mmol/L Final  . Glucose, Bld 02/07/2017 296* 65 - 99 mg/dL Final  . BUN 02/07/2017 29* 6 - 20 mg/dL Final  . Creatinine, Ser 02/07/2017 1.31* 0.44 - 1.00 mg/dL Final  . Calcium 02/07/2017 9.3  8.9 - 10.3 mg/dL Final  . Total Protein 02/07/2017 7.0  6.5 - 8.1 g/dL  Final  . Albumin 02/07/2017 3.2* 3.5 - 5.0 g/dL Final  . AST 02/07/2017 27  15 - 41 U/L Final  . ALT 02/07/2017 37  14 - 54 U/L Final  . Alkaline Phosphatase 02/07/2017 187* 38 - 126 U/L Final  . Total Bilirubin 02/07/2017 0.6  0.3 - 1.2 mg/dL Final  . GFR calc non Af Amer 02/07/2017 41* >60 mL/min Final  . GFR calc Af Amer 02/07/2017 48* >60 mL/min Final   Comment: (NOTE) The eGFR has been calculated using the CKD EPI equation. This calculation has not been validated in all clinical situations. eGFR's persistently <60 mL/min signify possible Chronic Kidney Disease.   . Anion gap 02/07/2017 7  5 - 15 Final  . Glucose-Capillary 02/06/2017 163* 65 - 99 mg/dL Final  . Comment 1 02/06/2017 Notify RN   Final  . Glucose-Capillary 02/06/2017 302* 65 - 99 mg/dL Final  . Glucose-Capillary 02/07/2017 261* 65 - 99 mg/dL Final  . Glucose-Capillary 02/07/2017 362* 65 - 99 mg/dL Final  . Glucose-Capillary 02/07/2017 261* 65 - 99 mg/dL Final  . Glucose-Capillary 02/07/2017 212* 65 - 99 mg/dL Final  . Glucose-Capillary 02/08/2017 112* 65 - 99 mg/dL Final  . Comment 1 02/08/2017 Notify RN   Final  . Glucose-Capillary 02/08/2017 196* 65 - 99 mg/dL Final  . Comment 1 02/08/2017 Notify RN   Final  . Glucose-Capillary 02/08/2017 101* 65 - 99 mg/dL Final  . Comment 1 02/08/2017 Notify RN   Final  . Glucose-Capillary 02/08/2017 137* 65 - 99 mg/dL Final  . Glucose-Capillary 02/09/2017 99  65 - 99 mg/dL Final  . Glucose-Capillary 02/09/2017 210* 65 - 99 mg/dL Final  . Glucose-Capillary 02/09/2017 274*  65 - 99 mg/dL Final  . Glucose-Capillary 02/09/2017 155* 65 - 99 mg/dL Final  . Comment 1 02/09/2017 Notify RN   Final  . Glucose-Capillary 02/10/2017 123* 65 - 99 mg/dL Final  . Glucose-Capillary 02/10/2017 253* 65 - 99 mg/dL Final  . Comment 1 02/10/2017 Notify RN   Final  . Glucose-Capillary 02/10/2017 285* 65 - 99 mg/dL Final  . Glucose-Capillary 02/10/2017 171* 65 - 99 mg/dL Final  . Glucose-Capillary 02/11/2017 142* 65 - 99 mg/dL Final  . Glucose-Capillary 02/11/2017 180* 65 - 99 mg/dL Final  . Glucose-Capillary 02/11/2017 115* 65 - 99 mg/dL Final  . Glucose-Capillary 02/11/2017 158* 65 - 99 mg/dL Final  . Glucose-Capillary 02/12/2017 108* 65 - 99 mg/dL Final  . Glucose-Capillary 02/12/2017 176* 65 - 99 mg/dL Final  . Glucose-Capillary 02/12/2017 189* 65 - 99 mg/dL Final  . Glucose-Capillary 02/12/2017 156* 65 - 99 mg/dL Final  . Glucose-Capillary 02/13/2017 164* 65 - 99 mg/dL Final  . Glucose-Capillary 02/13/2017 133* 65 - 99 mg/dL Final  . Comment 1 02/13/2017 Notify RN   Final  . Glucose-Capillary 02/13/2017 171* 65 - 99 mg/dL Final  . Glucose-Capillary 02/13/2017 186* 65 - 99 mg/dL Final  . Comment 1 02/13/2017 Notify RN   Final  . Glucose-Capillary 02/14/2017 168* 65 - 99 mg/dL Final  . Glucose-Capillary 02/14/2017 148* 65 - 99 mg/dL Final  . Comment 1 02/14/2017 Notify RN   Final  . Glucose-Capillary 02/14/2017 135* 65 - 99 mg/dL Final  . Comment 1 02/14/2017 Notify RN   Final  . Glucose-Capillary 02/14/2017 157* 65 - 99 mg/dL Final  . Glucose-Capillary 02/15/2017 118* 65 - 99 mg/dL Final  . Sodium 02/15/2017 137  135 - 145 mmol/L Final  . Potassium 02/15/2017 3.8  3.5 - 5.1 mmol/L Final  .  Chloride 02/15/2017 104  101 - 111 mmol/L Final  . CO2 02/15/2017 23  22 - 32 mmol/L Final  . Glucose, Bld 02/15/2017 134* 65 - 99 mg/dL Final  . BUN 02/15/2017 37* 6 - 20 mg/dL Final  . Creatinine, Ser 02/15/2017 1.34* 0.44 - 1.00 mg/dL Final  . Calcium 02/15/2017 9.6  8.9 - 10.3  mg/dL Final  . GFR calc non Af Amer 02/15/2017 40* >60 mL/min Final  . GFR calc Af Amer 02/15/2017 46* >60 mL/min Final   Comment: (NOTE) The eGFR has been calculated using the CKD EPI equation. This calculation has not been validated in all clinical situations. eGFR's persistently <60 mL/min signify possible Chronic Kidney Disease.   . Anion gap 02/15/2017 10  5 - 15 Final  . Glucose-Capillary 02/15/2017 200* 65 - 99 mg/dL Final  Admission on 01/26/2017, Discharged on 02/06/2017  No results displayed because visit has over 200 results.      Dg Wrist 2 Views Left  Result Date: 03/13/2017 CLINICAL DATA:  Pain without trauma EXAM: LEFT WRIST - 2 VIEW COMPARISON:  None. FINDINGS: There is no evidence of fracture or dislocation. There is no evidence of arthropathy or other focal bone abnormality. Soft tissues are unremarkable. IMPRESSION: Negative. Electronically Signed   By: Dorise Bullion III M.D   On: 03/13/2017 19:51   Dg Wrist 2 Views Right  Result Date: 03/13/2017 CLINICAL DATA:  Chronic pain.  No known injury. EXAM: RIGHT WRIST - 2 VIEW COMPARISON:  None. FINDINGS: There is no evidence of fracture or dislocation. There is no evidence of arthropathy or other focal bone abnormality. Soft tissues are unremarkable. IMPRESSION: Negative. Electronically Signed   By: Dorise Bullion III M.D   On: 03/13/2017 19:50     Assessment/Plan   ICD-10-CM   1. Anemia, unspecified type  - s/p PRBCs. She has hx iron deficency D64.9 CBC with Differential/Platelets  2. CKD (chronic kidney disease) stage 3, GFR 30-59 ml/min N18.3 BMP with eGFR    Ambulatory referral to Nephrology  3. Hypothyroidism due to acquired atrophy of thyroid E03.4 TSH    T4, Free  4. Type 2 diabetes mellitus with stage 3 chronic kidney disease, with long-term current use of insulin (HCC) E11.22 BMP with eGFR   N18.3 Hemoglobin A1c   Z79.4 telmisartan (MICARDIS) 20 MG tablet  5. Chronic systolic heart failure (HCC) I50.22     6. Essential hypertension I10   7. S/P colostomy ([place]) Z93.3   8. Mixed hyperlipidemia E78.2    Will need diabetic foot exam next ov  Continue current medications as ordered  START MICARDIS 20MG daily  Will call with lab results  Will call with nephrology referral   Follow up with specialists as scheduled  Get old records  Follow up in 1 month for DM and colostomy. Will need lipid panel  Gurley Climer S. Perlie Gold  [facility] [address] [place], [place] [address] [phone] Cell (Monday-Friday 8 AM - 5 PM) [phone] After 5 PM and follow prompts

## 2017-03-15 NOTE — Patient Instructions (Signed)
Continue current medications as ordered  START MICARDIS 20MG  daily  Will call with lab results  Will call with nephrology referral   Follow up with specialists as scheduled  Follow up in 1 month for DM and colostomy

## 2017-03-16 DIAGNOSIS — E1122 Type 2 diabetes mellitus with diabetic chronic kidney disease: Secondary | ICD-10-CM | POA: Diagnosis not present

## 2017-03-16 DIAGNOSIS — N183 Chronic kidney disease, stage 3 (moderate): Secondary | ICD-10-CM | POA: Diagnosis not present

## 2017-03-16 DIAGNOSIS — Z48815 Encounter for surgical aftercare following surgery on the digestive system: Secondary | ICD-10-CM | POA: Diagnosis not present

## 2017-03-16 DIAGNOSIS — I5032 Chronic diastolic (congestive) heart failure: Secondary | ICD-10-CM | POA: Diagnosis not present

## 2017-03-16 DIAGNOSIS — Z433 Encounter for attention to colostomy: Secondary | ICD-10-CM | POA: Diagnosis not present

## 2017-03-16 DIAGNOSIS — I130 Hypertensive heart and chronic kidney disease with heart failure and stage 1 through stage 4 chronic kidney disease, or unspecified chronic kidney disease: Secondary | ICD-10-CM | POA: Diagnosis not present

## 2017-03-16 LAB — BASIC METABOLIC PANEL WITH GFR
BUN: 19 mg/dL (ref 7–25)
CHLORIDE: 99 mmol/L (ref 98–110)
CO2: 25 mmol/L (ref 20–32)
Calcium: 9.4 mg/dL (ref 8.6–10.4)
Creat: 1.32 mg/dL — ABNORMAL HIGH (ref 0.50–0.99)
GFR, Est African American: 48 mL/min — ABNORMAL LOW (ref >=60)
GFR, Est Non African American: 42 mL/min — ABNORMAL LOW (ref >=60)
Glucose, Bld: 126 mg/dL — ABNORMAL HIGH (ref 65–99)
POTASSIUM: 4.3 mmol/L (ref 3.5–5.3)
SODIUM: 136 mmol/L (ref 135–146)

## 2017-03-16 LAB — HEMOGLOBIN A1C
HEMOGLOBIN A1C: 6.1 % — AB (ref <5.7)
MEAN PLASMA GLUCOSE: 128 mg/dL

## 2017-03-29 DIAGNOSIS — E1122 Type 2 diabetes mellitus with diabetic chronic kidney disease: Secondary | ICD-10-CM | POA: Diagnosis not present

## 2017-03-29 DIAGNOSIS — Z433 Encounter for attention to colostomy: Secondary | ICD-10-CM | POA: Diagnosis not present

## 2017-03-29 DIAGNOSIS — I130 Hypertensive heart and chronic kidney disease with heart failure and stage 1 through stage 4 chronic kidney disease, or unspecified chronic kidney disease: Secondary | ICD-10-CM | POA: Diagnosis not present

## 2017-03-29 DIAGNOSIS — Z48815 Encounter for surgical aftercare following surgery on the digestive system: Secondary | ICD-10-CM | POA: Diagnosis not present

## 2017-03-29 DIAGNOSIS — N183 Chronic kidney disease, stage 3 (moderate): Secondary | ICD-10-CM | POA: Diagnosis not present

## 2017-03-29 DIAGNOSIS — I5032 Chronic diastolic (congestive) heart failure: Secondary | ICD-10-CM | POA: Diagnosis not present

## 2017-03-30 DIAGNOSIS — I10 Essential (primary) hypertension: Secondary | ICD-10-CM | POA: Diagnosis not present

## 2017-03-30 DIAGNOSIS — K51814 Other ulcerative colitis with abscess: Secondary | ICD-10-CM | POA: Diagnosis not present

## 2017-03-30 DIAGNOSIS — G4730 Sleep apnea, unspecified: Secondary | ICD-10-CM | POA: Diagnosis not present

## 2017-03-30 DIAGNOSIS — F329 Major depressive disorder, single episode, unspecified: Secondary | ICD-10-CM | POA: Diagnosis not present

## 2017-04-04 DIAGNOSIS — Z933 Colostomy status: Secondary | ICD-10-CM | POA: Diagnosis not present

## 2017-04-04 DIAGNOSIS — N181 Chronic kidney disease, stage 1: Secondary | ICD-10-CM | POA: Diagnosis not present

## 2017-04-06 ENCOUNTER — Telehealth: Payer: Self-pay

## 2017-04-06 NOTE — Telephone Encounter (Signed)
Patient's daughter called requesting letter for patient to stop getting billed through [employer].  Patient moved with her daughter in July 2018 following a hospital visit, yet [employer] will not release her from her contract.   Please advise   Per daughter ok to leave message on voicemail if no answer. Call patient's daughter to pick-up letter

## 2017-04-07 ENCOUNTER — Encounter: Payer: Self-pay | Admitting: Internal Medicine

## 2017-04-07 NOTE — Telephone Encounter (Signed)
Letter available for pick-up, Crystal [name] informed

## 2017-04-14 ENCOUNTER — Encounter: Payer: Self-pay | Admitting: Physical Medicine & Rehabilitation

## 2017-04-14 ENCOUNTER — Telehealth: Payer: Self-pay | Admitting: *Deleted

## 2017-04-14 ENCOUNTER — Encounter: Payer: Medicare Other | Attending: Physical Medicine & Rehabilitation

## 2017-04-14 ENCOUNTER — Ambulatory Visit (HOSPITAL_BASED_OUTPATIENT_CLINIC_OR_DEPARTMENT_OTHER): Payer: Medicare Other | Admitting: Physical Medicine & Rehabilitation

## 2017-04-14 VITALS — BP 123/74 | HR 78

## 2017-04-14 DIAGNOSIS — G8929 Other chronic pain: Secondary | ICD-10-CM | POA: Insufficient documentation

## 2017-04-14 DIAGNOSIS — E1122 Type 2 diabetes mellitus with diabetic chronic kidney disease: Secondary | ICD-10-CM | POA: Insufficient documentation

## 2017-04-14 DIAGNOSIS — I429 Cardiomyopathy, unspecified: Secondary | ICD-10-CM | POA: Diagnosis not present

## 2017-04-14 DIAGNOSIS — Z933 Colostomy status: Secondary | ICD-10-CM | POA: Insufficient documentation

## 2017-04-14 DIAGNOSIS — M654 Radial styloid tenosynovitis [de Quervain]: Secondary | ICD-10-CM | POA: Diagnosis not present

## 2017-04-14 DIAGNOSIS — I130 Hypertensive heart and chronic kidney disease with heart failure and stage 1 through stage 4 chronic kidney disease, or unspecified chronic kidney disease: Secondary | ICD-10-CM | POA: Insufficient documentation

## 2017-04-14 DIAGNOSIS — I509 Heart failure, unspecified: Secondary | ICD-10-CM | POA: Insufficient documentation

## 2017-04-14 DIAGNOSIS — N183 Chronic kidney disease, stage 3 (moderate): Secondary | ICD-10-CM | POA: Insufficient documentation

## 2017-04-14 DIAGNOSIS — Z48815 Encounter for surgical aftercare following surgery on the digestive system: Secondary | ICD-10-CM | POA: Diagnosis not present

## 2017-04-14 DIAGNOSIS — D509 Iron deficiency anemia, unspecified: Secondary | ICD-10-CM | POA: Insufficient documentation

## 2017-04-14 DIAGNOSIS — M545 Low back pain: Secondary | ICD-10-CM | POA: Diagnosis not present

## 2017-04-14 DIAGNOSIS — I5032 Chronic diastolic (congestive) heart failure: Secondary | ICD-10-CM | POA: Diagnosis not present

## 2017-04-14 DIAGNOSIS — R5381 Other malaise: Secondary | ICD-10-CM | POA: Diagnosis not present

## 2017-04-14 DIAGNOSIS — Z95810 Presence of automatic (implantable) cardiac defibrillator: Secondary | ICD-10-CM | POA: Diagnosis not present

## 2017-04-14 DIAGNOSIS — M25531 Pain in right wrist: Secondary | ICD-10-CM | POA: Insufficient documentation

## 2017-04-14 DIAGNOSIS — M25532 Pain in left wrist: Secondary | ICD-10-CM | POA: Diagnosis not present

## 2017-04-14 DIAGNOSIS — Z433 Encounter for attention to colostomy: Secondary | ICD-10-CM | POA: Diagnosis not present

## 2017-04-14 NOTE — Patient Instructions (Signed)
Please apply your thumb spica splint on the left side where it during the day for the next 4 weeks

## 2017-04-14 NOTE — Progress Notes (Signed)
Subjective:    Patient ID: Crystal [name], female    DOB: 1951-02-23, 66 y.o.   MRN: [id number]  HPI Patient returns today complaining of left>>Right  wrist pain. She has had carpal tunnel surgery in the past, which helped with her numbness and tingling in the fingers but did not help with her wrist pain. We discussed her wrist x-rays, which were performed prostate one month ago. These showed no bony abnormalities. She has pain over the lateral aspect of the left wrist. She's had no recent trauma. She has pain mainly when she uses her hand.  She also has been taking tramadol, which helps with her low back pain. She's had no new issues with her back Pain Inventory Average Pain 6 Pain Right Now 3 My pain is sharp  In the last 24 hours, has pain interfered with the following? General activity 0 Relation with others 0 Enjoyment of life 0 What TIME of day is your pain at its worst? evening Sleep (in general) Fair  Pain is worse with: standing and some activites Pain improves with: rest Relief from Meds: 5  Mobility walk without assistance ability to climb steps?  yes do you drive?  yes  Function retired  Neuro/Psych numbness  Prior Studies Any changes since last visit?  no  Physicians involved in your care Any changes since last visit?  no   Family History  Problem Relation Age of Onset  . Hypertension Father   . Heart disease Father   . Cancer Father        prostate  . Heart disease Other        (Maternal side) Ischemic heart disease  . Diabetes Mellitus II Other   . Arrhythmia Mother   . Diabetes Mellitus II Mother        Borderline DM  . Hypertension Mother   . Asthma Mother   . Cancer Brother   . Dementia Brother   . Hypertension Brother   . Hypertension Daughter   . Hypertension Daughter   . Diabetes Daughter   . Hypertension Daughter   . Atrial fibrillation Daughter   . GER disease Daughter   . Hypertension Son   . Anxiety disorder Son   .  Hypothyroidism Son    Social History   Social History  . Marital status: Widowed    Spouse name: N/A  . Number of children: N/A  . Years of education: N/A   Occupational History  .      retired Corporate treasurer   Social History Main Topics  . Smoking status: Never Smoker  . Smokeless tobacco: Never Used  . Alcohol use No  . Drug use: No  . Sexual activity: No   Other Topics Concern  . Not on file   Social History Narrative   Pt lives in [place] alone.  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      As of 03/15/17:   Diet: 1800 Calorie      Caffeine: Yes      Married, if yes what year: Widowed, married 1973      Do you live in a house, apartment, assisted living, [place], trailer, ect: House, 1 stories, and 1 person      Pets: No      Current/Past profession: LPN, retired       Exercise: Yes, walking          Living Will: Yes   DNR:  No   POA/HPOA: Yes      Functional Status:   Do you have difficulty bathing or dressing yourself? No   Do you have difficulty preparing food or eating? No   Do you have difficulty managing your medications? No   Do you have difficulty managing your finances? No   Do you have difficulty affording your medications? Yes   Past Surgical History:  Procedure Laterality Date  . CARDIAC CATHETERIZATION  2005   no obstructive CAD per patient  . CARDIAC DEFIBRILLATOR PLACEMENT  2005   BiV ICD implanted,  LV lead is an epicardial lead  . CARPAL TUNNEL RELEASE Left 07/25/2014   Dr.Williamson   . COLONOSCOPY     2010-2011 Dr.Kipreos   . CYST REMOVAL HAND Right 05/2013   thumb  . IMPLANTABLE CARDIOVERTER DEFIBRILLATOR GENERATOR CHANGE  2008  . TOTAL ABDOMINAL HYSTERECTOMY    . TRIGGER FINGER RELEASE  05/213  . TUBAL LIGATION  01/2016  . VESICOVAGINAL FISTULA CLOSURE W/ TAH     Past Medical History:  Diagnosis Date  . Anemia, iron deficiency   . Angioedema    felt to likely be due to ace  inhibitors but says she has had this even off of medicines, appears to be tolerating ARBs chronically  . Automatic implantable cardiac defibrillator in situ    high RV threshold chronically, device was turned off in 2014  . Back pain   . CHF (congestive heart failure) ([place])   . Chronic pain   . Chronic renal insufficiency   . Degenerative joint disease   . Depression   . Diabetes mellitus, type 2 ([place])   . Gout   . History of mononucleosis 03/2014  . Hyperlipidemia   . Hypertension   . Hypothyroid   . Insomnia   . Intervertebral disc degeneration   . Ischemic cardiomyopathy   . Kidney failure    Stage 3  . LBBB (left bundle branch block)   . Neuropathy   . Nonischemic cardiomyopathy ([place])    s/p ICD in 2005. EF has since recovered  . Obesity   . Systemic hypertension   . Unspecified hypothyroidism   . Vitamin D deficiency    There were no vitals taken for this visit.  Opioid Risk Score:   Fall Risk Score:  `1  Depression screen PHQ 2/9  Depression screen [facility] 2/9 03/15/2017 03/13/2017  Decreased Interest 0 0  Down, Depressed, Hopeless 0 0  PHQ - 2 Score 0 0  Altered sleeping - 0  Tired, decreased energy - 2  Change in appetite - 0  Feeling bad or failure about yourself  - 0  Trouble concentrating - 0  Moving slowly or fidgety/restless - 0  Suicidal thoughts - 0  PHQ-9 Score - 2     Review of Systems  Constitutional: Negative.   HENT: Negative.   Eyes: Negative.   Respiratory: Negative.   Cardiovascular: Negative.   Gastrointestinal: Negative.   Endocrine: Negative.   Genitourinary: Negative.   Musculoskeletal: Negative.   Skin: Negative.   Allergic/Immunologic: Negative.   Neurological: Negative.   Hematological: Negative.   Psychiatric/Behavioral: Negative.   All other systems reviewed and are negative.      Objective:   Physical Exam  Constitutional: She is oriented to person, place, and time. She appears well-developed and well-nourished.    HENT:  Head: Normocephalic and atraumatic.  Eyes: Pupils are equal, round, and reactive to light. Conjunctivae and EOM are normal.  Musculoskeletal:  Left wrist: She exhibits tenderness. She exhibits normal range of motion, no swelling, no effusion and no deformity.  Neurological: She is alert and oriented to person, place, and time.  Psychiatric: She has a normal mood and affect.  Nursing note and vitals reviewed.  Patient has no swelling or erythema over her left wrist. There is a positive Finkelstein's test. There is no pain with wrist extension and flexion. No pain with finger flexion and extension. There is normal sensation in the left hand. No pain with elbow movement.  There is pain with resisted left thumb extension as well as left thumb abduction      Assessment & Plan:  1. First extensor compartment tendinitis, left wrist Bilateral wrist X-rays negative Recommended spica splint  We'll do injection today  Left wrist first extensor compartment injection Indication Left wrist De Quervain's tenosynovitis Informed consent was obtained after discussing risks and benefits of the procedure with patient, including bleeding, bruising, infection, she elects to proceed and has given written consent. Risk placed in neutral position. Patient in the sitting position, the first extensor compartment was identified with a 12 Hz linear transducer. Area was marked and prepped with Betadine. Then a 27-gauge 1 half inch needle was used to anesthetize skin and subcutaneous tissue with 1 cc 1% lidocaine. Then a 5-gauge 1.5 inch needle was inserted under direct ultrasound visualization, the tendons of the abductor pollicis longus and extensor pollicis brevis were identified in cross section. The 25-gauge needle was inserted just superior to the tendons and 1 half cc of a solution containing 0. 5 mL Celestone 6 mg per mL plus point 5 mL 1% lidocaine was injected. Then the area below the tendons was  injected with the same solution point 5 mL after redirecting the needle. Patient tolerated procedure well    2. Chronic low back pain. Continue tramadol, has 5 refills. We'll need to follow-up in January for this

## 2017-04-14 NOTE — Telephone Encounter (Signed)
Patient brought a bottle into office and stated that Dr. Eulas Post told her to bring bottle by to get a refill.  Temazepam 15mg  Take one capsule by mouth at bedtime as needed for insomnia #30-Dr. Dannial Monarch 05/10/16. Was filled at [facility] [address] [phone]  Is this ok to refill. Please Advise.  Pharmacy:[facility]

## 2017-04-15 NOTE — Telephone Encounter (Signed)
Ok for temazepam 15mg  #30 take 1 po qhs prn insomnia with no RF

## 2017-04-17 MED ORDER — TEMAZEPAM 15 MG PO CAPS: 15.0000 mg | ORAL_CAPSULE | Freq: Every evening | ORAL | 0 refills | Status: DC | PRN

## 2017-04-17 NOTE — Telephone Encounter (Signed)
Rx printed and faxed to [facility]. Patient notified.

## 2017-04-26 ENCOUNTER — Encounter: Payer: Self-pay | Admitting: Internal Medicine

## 2017-04-26 ENCOUNTER — Ambulatory Visit (INDEPENDENT_AMBULATORY_CARE_PROVIDER_SITE_OTHER): Payer: Medicare Other | Admitting: Internal Medicine

## 2017-04-26 VITALS — BP 118/78 | HR 80 | Temp 98.7°F | Resp 16 | Ht 66.0 in | Wt 200.8 lb

## 2017-04-26 DIAGNOSIS — E1122 Type 2 diabetes mellitus with diabetic chronic kidney disease: Secondary | ICD-10-CM

## 2017-04-26 DIAGNOSIS — Z933 Colostomy status: Secondary | ICD-10-CM

## 2017-04-26 DIAGNOSIS — N183 Chronic kidney disease, stage 3 (moderate): Secondary | ICD-10-CM | POA: Diagnosis not present

## 2017-04-26 DIAGNOSIS — E782 Mixed hyperlipidemia: Secondary | ICD-10-CM | POA: Diagnosis not present

## 2017-04-26 DIAGNOSIS — Z794 Long term (current) use of insulin: Secondary | ICD-10-CM | POA: Diagnosis not present

## 2017-04-26 DIAGNOSIS — Z23 Encounter for immunization: Secondary | ICD-10-CM

## 2017-04-26 LAB — LIPID PANEL
CHOL/HDL RATIO: 4.9 (calc) (ref <5.0)
CHOLESTEROL: 176 mg/dL (ref <200)
HDL: 36 mg/dL — AB (ref >50)
LDL Cholesterol (Calc): 102 mg/dL (calc) — ABNORMAL HIGH
Non-HDL Cholesterol (Calc): 140 mg/dL (calc) — ABNORMAL HIGH (ref <130)
TRIGLYCERIDES: 263 mg/dL — AB (ref <150)

## 2017-04-26 NOTE — Patient Instructions (Signed)
Continue current medications as ordered  Follow up with specialists as scheduled  Will call with lab results  Influenza vaccine given today  AVOID PORK PRODUCTS, RED MEAT AND FISH  Follow up in 4 mos for DM, HTN, hyperlipidemia, gout

## 2017-04-26 NOTE — Progress Notes (Signed)
Patient ID: Crystal [name], female   DOB: 1950/11/22, 66 y.o.   MRN: [id number]    Location:  [facility] Place of Service: OFFICE  Chief Complaint  Patient presents with  . Medical Management of Chronic Issues    1 month follow up; Flu vaccine    HPI:  66 yo female seen today for f/u. She has lost 13 lbs since last ov intentionally. She has an appt with surgery next week to discuss reversal of colostomy. She also is due to see nephrology next week. No issues. No N/V, f/c. No HA or dizziness. No CP or SOB.  Reviewed old records from previous PCP. Lipid panel last done in 09/2016  HTN - BP stable on coreg and micardis. She was on valsartan prior to hospital stay but med stopped 2/2 hypotension.  S/p colostomy - stable. She has appt with sx next week. No issues with colostomy  CHF - she has had an AICD in the past but EF improved from <30% --> EF 45-50% with mild-mod LVH and grade 1 DD in 05/03/2016. Battery in AICD died and she was told it was not necessary to replace it due to improved EF. She underwent lexiscan stress test, cardiac cath and 2D echo this yr. She takes coreg  Hyperlipidemia - stable on crestor. No myalgias. LDL 105; TG 323; Tchol 202; HDL 32 in 09/2016.   Insomnia - she takes prn restoril 60m   OSA - on CPAP with 2L [place] O2 qHS. 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  A1c 6.2%. No low BS reactions. She takes toujeo, apidra. Gabapentin controls neuropathy. She has stage 3 kidney disease. She has allergy to ACEI but can tolerate ARB. She is tolerating micardis 253mdaily for renal protection  Anemia - rec'd 5 units PRBCs. Last Hgb 9. She has a hx iron deficiency and is taking iron supplement.  CKD - stage 3. Cr  1.34. She has appt with nephro next week.  Gout - she has frequent gout flares in her toe. She eats polish sausage a few times per week. She takes uloric.  Hypothyroidism - no sx's. She takes levothyroxine.  GERD - stable on protonix   Past  Medical History:  Diagnosis Date  . Anemia, iron deficiency   . Angioedema    felt to likely be due to ace inhibitors but says she has had this even off of medicines, appears to be tolerating ARBs chronically  . Asthmatic bronchitis with status asthmaticus   . Automatic implantable cardiac defibrillator in situ    high RV threshold chronically, device was turned off in 2014  . Back pain   . CHF (congestive heart failure) ([facility]  . Chronic pain   . Chronic renal insufficiency   . Degenerative joint disease   . Depression   . Diabetes mellitus, type 2 ([facility]  . Gout   . History of mononucleosis 09Oct 10, 2015. Hyperlipidemia   . Hypertension   . Hypothyroid   . Insomnia   . Intervertebral disc degeneration   . Ischemic cardiomyopathy   . Kidney failure    Stage 3  . LBBB (left bundle branch block)   . Neuropathy   . Nonischemic cardiomyopathy ([facility]   s/p ICD in 2005. EF has since recovered  . Obesity   . Systemic hypertension   . Unspecified hypothyroidism   . Vitamin D deficiency     Past Surgical History:  Procedure Laterality  Date  . CARDIAC CATHETERIZATION  2005   no obstructive CAD per patient  . CARDIAC DEFIBRILLATOR PLACEMENT  2005   BiV ICD implanted,  LV lead is an epicardial lead  . CARPAL TUNNEL RELEASE Left 07/25/2014   Dr.Williamson   . COLONOSCOPY     2010-2011 Dr.Kipreos   . CYST REMOVAL HAND Right 05/2013   thumb  . IMPLANTABLE CARDIOVERTER DEFIBRILLATOR GENERATOR CHANGE  2008  . TOTAL ABDOMINAL HYSTERECTOMY    . TRIGGER FINGER RELEASE  05/213  . TUBAL LIGATION  01/2016  . VESICOVAGINAL FISTULA CLOSURE W/ TAH      Patient Care Team: Gildardo Cranker, DO as PCP - General (Internal Medicine) Phineas Inches, MD as Consulting Physician (Nephrology) Judeth Horn, MD as Consulting Physician (General Surgery)  Social History   Social History  . Marital status: Widowed    Spouse name: N/A  . Number of children: N/A  . Years of education: N/A    Occupational History  .      retired Corporate treasurer   Social History Main Topics  . Smoking status: Never Smoker  . Smokeless tobacco: Never Used  . Alcohol use No  . Drug use: No  . Sexual activity: No   Other Topics Concern  . Not on file   Social History Narrative   [place] lives [place].  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      As of 03/15/17:   Diet: 1800 Calorie      Caffeine: Yes      Married, if yes what year: Widowed, married 1973      Do you live in a house, apartment, assisted living, condo, trailer, ect: House, 1 stories, and 1 person      Pets: No      Current/Past profession: LPN, retired       Exercise: Yes, walking          Living Will: Yes   DNR: No   POA/HPOA: Yes      Functional Status:   Do you have difficulty bathing or dressing yourself? No   Do you have difficulty preparing food or eating? No   Do you have difficulty managing your medications? No   Do you have difficulty managing your finances? No   Do you have difficulty affording your medications? Yes     reports that she has never smoked. She has never used smokeless tobacco. She reports that she does not drink alcohol or use drugs.  Family History  Problem Relation Age of Onset  . Hypertension Father   . Heart disease Father   . Cancer Father        prostate  . Heart disease Other        (Maternal side) Ischemic heart disease  . Diabetes Mellitus II Other   . Arrhythmia Mother   . Diabetes Mellitus II Mother        Borderline DM  . Hypertension Mother   . Asthma Mother   . Cancer Brother   . Dementia Brother   . Hypertension Brother   . Hypertension Daughter   . Hypertension Daughter   . Diabetes Daughter   . Hypertension Daughter   . Atrial fibrillation Daughter   . GER disease Daughter   . Hypertension Son   . Anxiety disorder Son   . Hypothyroidism Son    Family Status  Relation Status  . Father Deceased  .  Other  Alive  . Mother Alive  . Brother Deceased  . Brother Alive  . Daughter Alive  . Daughter Alive  . Daughter Alive  . Daughter Alive  . Son Alive     Allergies  Allergen Reactions  . Glucophage [Metformin Hcl] Other (See Comments)    Renal failure  . Ace Inhibitors Swelling and Other (See Comments)    Angioedema  . Advicor [Niacin-Lovastatin Er] Other (See Comments)    Muscle aches  . Bystolic [Nebivolol Hcl] Other (See Comments)    Edema  . Erythromycin Diarrhea, Nausea And Vomiting and Other (See Comments)    *DERIVATIVES*  . Lipitor [Atorvastatin] Other (See Comments)    Muscle aches  . Lopid [Gemfibrozil] Other (See Comments)    Muscle aches  . Statins Other (See Comments)    Muscle aches  . Adhesive [Tape] Rash    Medications: Patient's Medications  New Prescriptions   No medications on file  Previous Medications   CARVEDILOL (COREG) 12.5 MG TABLET    Take 12.5 mg by mouth 2 (two) times daily with a meal.   CRESTOR 10 MG TABLET    Take 1 tablet (10 mg total) by mouth at bedtime.   FERROUS SULFATE 325 (65 FE) MG EC TABLET    Take 325 mg by mouth 2 (two) times daily.   GABAPENTIN (NEURONTIN) 100 MG CAPSULE    Take 100-400 mg by mouth See admin instructions. Take 100 mg by mouth in the morning and take 800 mg by mouth at bedtime   INSULIN GLARGINE (TOUJEO SOLOSTAR) 300 UNIT/ML SOPN    Inject 52 Units into the skin 2 (two) times daily.   INSULIN GLULISINE (APIDRA) 100 UNIT/ML SOLOSTAR PEN    Inject 20 Units into the skin 3 (three) times daily.   LEVOTHYROXINE (SYNTHROID, LEVOTHROID) 88 MCG TABLET    Take 88 mcg by mouth daily.    LORATADINE (CLARITIN) 10 MG TABLET    Take 10 mg by mouth daily.   MONTELUKAST (SINGULAIR) 10 MG TABLET    Take 10 mg by mouth at bedtime.   NYSTATIN (MYCOSTATIN/NYSTOP) POWDER    Apply 1 g topically 3 (three) times daily as needed. To abdominal folds and armpit area   OMEGA-3 ACID ETHYL ESTERS (LOVAZA) 1 G CAPSULE    Take 4 g by mouth  daily.   PANTOPRAZOLE (PROTONIX) 40 MG TABLET    Take 1 tablet (40 mg total) by mouth daily.   TELMISARTAN (MICARDIS) 20 MG TABLET    Take 1 tablet (20 mg total) by mouth daily.   TEMAZEPAM (RESTORIL) 15 MG CAPSULE    Take 1 capsule (15 mg total) by mouth at bedtime as needed for sleep.   TRAMADOL (ULTRAM) 50 MG TABLET    Take 2 tablets (100 mg total) by mouth 2 (two) times daily.   ULORIC 40 MG TABLET    Take 40 mg by mouth daily.   Modified Medications   No medications on file  Discontinued Medications   CARVEDILOL (COREG) 6.25 MG TABLET    Take 1 tablet (6.25 mg total) by mouth 2 (two) times daily with a meal.   INSULIN GLULISINE (APIDRA) 100 UNIT/ML INJECTION    Inject 0.06 mLs (6 Units total) into the skin 3 (three) times daily with meals.   MENTHOL-METHYL SALICYLATE (MUSCLE RUB) 10-15 % CREA    Apply 1 application topically 2 (two) times daily as needed for muscle pain.   MULTIPLE VITAMIN (MULTIVITAMIN) TABLET    Take 1  tablet by mouth daily.   NYSTATIN (MYCOSTATIN/NYSTOP) POWDER    Apply topically 3 (three) times daily. Apply to rash under abdominal folds and right armpit.    Review of Systems  Vitals:   04/26/17 1535  BP: 118/78  Pulse: 80  Resp: 16  Temp: 98.7 F (37.1 C)  TempSrc: Oral  SpO2: 94%  Weight: 200 lb 12.8 oz (91.1 kg)  Height: _0  (1.676 m)   Body mass index is 32.41 kg/m.  Physical Exam  Constitutional: She is oriented to person, place, and time. She appears well-developed and well-nourished.  Neck: Carotid bruit is not present.  Cardiovascular: Normal rate, regular rhythm, normal heart sounds and intact distal pulses.  Exam reveals no gallop and no friction rub.   No murmur heard. No LE edema b/l. no calf TTP.   Pulmonary/Chest: Effort normal and breath sounds normal. No respiratory distress. She has no wheezes. She has no rales.  Abdominal: Soft. Normal appearance and bowel sounds are normal. She exhibits distension. She exhibits no mass. There is no  hepatomegaly. There is tenderness. There is no rigidity, no rebound and no guarding. No hernia.  Surgical incisional scar healing well with no wound dehiscence but some TTP. No d/c. blancheable redness; colostomy intact  Musculoskeletal: She exhibits edema.  Neurological: She is alert and oriented to person, place, and time.  Skin: Skin is warm and dry. No rash noted.  Psychiatric: She has a normal mood and affect. Her behavior is normal. Judgment and thought content normal.   Diabetic Foot Exam - Simple   Simple Foot Form Diabetic Foot exam was performed with the following findings:  Yes 04/26/2017  4:32 PM  Visual Inspection No deformities, no ulcerations, no other skin breakdown bilaterally:  Yes Sensation Testing Intact to touch and monofilament testing bilaterally:  Yes Pulse Check Posterior Tibialis and Dorsalis pulse intact bilaterally:  Yes Comments       Labs reviewed: Abstract on 04/19/2017  Component Date Value Ref Range Status  . Hemoglobin 02/18/2016 9.1* 12.0 - 16.0 Final  . HCT 02/18/2016 28* 36 - 46 Final  . Platelets 02/18/2016 264  150 - 399 Final  . WBC 02/18/2016 6.2   Final  . Glucose 02/18/2016 122   Final  . BUN 02/18/2016 30* 4 - 21 Final  . Creatinine 02/18/2016 1.4* 0.5 - 1.1 Final  . Potassium 02/18/2016 4.5  3.4 - 5.3 Final  . Sodium 02/18/2016 137  137 - 147 Final  . HM Mammogram 10/19/2015 0-4 Bi-Rad  0-4 Bi-Rad, Self Reported Normal Final   category 2: [place] on 03/22/2017  Component Date Value Ref Range Status  . Hemoglobin 07/26/2016 13.1  12.0 - 16.0 Final  . HCT 07/26/2016 41  36 - 46 Final  . Platelets 07/26/2016 223  150 - 399 Final  . WBC 07/26/2016 8.4   Final  . BUN 07/26/2016 36* 4 - 21 Final  . Creatinine 07/26/2016 1.5* 0.5 - 1.1 Final  . Potassium 07/26/2016 4.4  3.4 - 5.3 Final  . Vit D, 25-Hydroxy 12/03/2014 25   Final  . Hemoglobin A1C 10/05/2015 7.2   Final  Office Visit on 03/15/2017  Component Date Value  Ref Range Status  . WBC 03/15/2017 7.9  3.8 - 10.8 K/uL Final  . RBC 03/15/2017 3.75* 3.80 - 5.10 MIL/uL Final  . Hemoglobin 03/15/2017 10.1* 11.7 - 15.5 g/dL Final  . HCT 03/15/2017 32.1* 35.0 - 45.0 % Final  . MCV 03/15/2017 85.6  80.0 - 100.0 fL Final  . MCH 03/15/2017 26.9* 27.0 - 33.0 pg Final  . MCHC 03/15/2017 31.5* 32.0 - 36.0 g/dL Final  . RDW 03/15/2017 16.0* 11.0 - 15.0 % Final  . Platelets 03/15/2017 278  140 - 400 K/uL Final  . MPV 03/15/2017 10.0  7.5 - 12.5 fL Final  . Neutro Abs 03/15/2017 4266  1,500 - 7,800 cells/uL Final  . Lymphs Abs 03/15/2017 2449  850 - 3,900 cells/uL Final  . Monocytes Absolute 03/15/2017 790  200 - 950 cells/uL Final  . Eosinophils Absolute 03/15/2017 395  15 - 500 cells/uL Final  . Basophils Absolute 03/15/2017 0  0 - 200 cells/uL Final  . Neutrophils Relative % 03/15/2017 54  % Final  . Lymphocytes Relative 03/15/2017 31  % Final  . Monocytes Relative 03/15/2017 10  % Final  . Eosinophils Relative 03/15/2017 5  % Final  . Basophils Relative 03/15/2017 0  % Final  . Smear Review 03/15/2017 Criteria for review not met   Final  . Sodium 03/15/2017 136  135 - 146 mmol/L Final  . Potassium 03/15/2017 4.3  3.5 - 5.3 mmol/L Final  . Chloride 03/15/2017 99  98 - 110 mmol/L Final  . CO2 03/15/2017 25  20 - 32 mmol/L Final   Comment: ** Please note change in reference range(s). **     . Glucose, Bld 03/15/2017 126* 65 - 99 mg/dL Final  . BUN 03/15/2017 19  7 - 25 mg/dL Final  . Creat 03/15/2017 1.32* 0.50 - 0.99 mg/dL Final   Comment:   For patients > or = 67 years of age: The upper reference limit for Creatinine is approximately 13% higher for people identified as African-American.     . Calcium 03/15/2017 9.4  8.6 - 10.4 mg/dL Final  . GFR, Est African American 03/15/2017 48* >=60 mL/min Final  . GFR, Est Non African American 03/15/2017 42* >=60 mL/min Final  . Hgb A1c MFr Bld 03/15/2017 6.1* <5.7 % Final   Comment:   For someone without  known diabetes, a hemoglobin A1c value between 5.7% and 6.4% is consistent with prediabetes and should be confirmed with a follow-up test.   For someone with known diabetes, a value <7% indicates that their diabetes is well controlled. A1c targets should be individualized based on duration of diabetes, age, co-morbid conditions and other considerations.   This assay result is consistent with an increased risk of diabetes.   Currently, no consensus exists regarding use of hemoglobin A1c for diagnosis of diabetes in children.     . Mean Plasma Glucose 03/15/2017 128  mg/dL Final  . TSH 03/15/2017 3.30  mIU/L Final   Comment:   Reference Range   > or = 20 Years  0.40-4.50   Pregnancy Range First trimester  0.26-2.66 Second trimester 0.55-2.73 Third trimester  0.43-2.91     . Free T4 03/15/2017 1.3  0.8 - 1.8 ng/dL Final  Admission on 02/06/2017, Discharged on 02/15/2017  Component Date Value Ref Range Status  . WBC 02/07/2017 8.1  4.0 - 10.5 K/uL Final  . RBC 02/07/2017 3.08* 3.87 - 5.11 MIL/uL Final  . Hemoglobin 02/07/2017 8.4* 12.0 - 15.0 g/dL Final  . HCT 02/07/2017 26.3* 36.0 - 46.0 % Final  . MCV 02/07/2017 85.4  78.0 - 100.0 fL Final  . MCH 02/07/2017 27.3  26.0 - 34.0 pg Final  . MCHC 02/07/2017 31.9  30.0 - 36.0 g/dL Final  . RDW 02/07/2017 16.1* 11.5 - 15.5 %  Final  . Platelets 02/07/2017 326  150 - 400 K/uL Final  . Neutrophils Relative % 02/07/2017 74  % Final  . Neutro Abs 02/07/2017 6.1  1.7 - 7.7 K/uL Final  . Lymphocytes Relative 02/07/2017 16  % Final  . Lymphs Abs 02/07/2017 1.3  0.7 - 4.0 K/uL Final  . Monocytes Relative 02/07/2017 8  % Final  . Monocytes Absolute 02/07/2017 0.6  0.1 - 1.0 K/uL Final  . Eosinophils Relative 02/07/2017 1  % Final  . Eosinophils Absolute 02/07/2017 0.1  0.0 - 0.7 K/uL Final  . Basophils Relative 02/07/2017 1  % Final  . Basophils Absolute 02/07/2017 0.0  0.0 - 0.1 K/uL Final  . Sodium 02/07/2017 136  135 - 145 mmol/L  Final  . Potassium 02/07/2017 5.3* 3.5 - 5.1 mmol/L Final   NO VISIBLE HEMOLYSIS  . Chloride 02/07/2017 109  101 - 111 mmol/L Final  . CO2 02/07/2017 20* 22 - 32 mmol/L Final  . Glucose, Bld 02/07/2017 296* 65 - 99 mg/dL Final  . BUN 02/07/2017 29* 6 - 20 mg/dL Final  . Creatinine, Ser 02/07/2017 1.31* 0.44 - 1.00 mg/dL Final  . Calcium 02/07/2017 9.3  8.9 - 10.3 mg/dL Final  . Total Protein 02/07/2017 7.0  6.5 - 8.1 g/dL Final  . Albumin 02/07/2017 3.2* 3.5 - 5.0 g/dL Final  . AST 02/07/2017 27  15 - 41 U/L Final  . ALT 02/07/2017 37  14 - 54 U/L Final  . Alkaline Phosphatase 02/07/2017 187* 38 - 126 U/L Final  . Total Bilirubin 02/07/2017 0.6  0.3 - 1.2 mg/dL Final  . GFR calc non Af Amer 02/07/2017 41* >60 mL/min Final  . GFR calc Af Amer 02/07/2017 48* >60 mL/min Final   Comment: (NOTE) The eGFR has been calculated using the CKD EPI equation. This calculation has not been validated in all clinical situations. eGFR's persistently <60 mL/min signify possible Chronic Kidney Disease.   . Anion gap 02/07/2017 7  5 - 15 Final  . Glucose-Capillary 02/06/2017 163* 65 - 99 mg/dL Final  . Comment 1 02/06/2017 Notify RN   Final  . Glucose-Capillary 02/06/2017 302* 65 - 99 mg/dL Final  . Glucose-Capillary 02/07/2017 261* 65 - 99 mg/dL Final  . Glucose-Capillary 02/07/2017 362* 65 - 99 mg/dL Final  . Glucose-Capillary 02/07/2017 261* 65 - 99 mg/dL Final  . Glucose-Capillary 02/07/2017 212* 65 - 99 mg/dL Final  . Glucose-Capillary 02/08/2017 112* 65 - 99 mg/dL Final  . Comment 1 02/08/2017 Notify RN   Final  . Glucose-Capillary 02/08/2017 196* 65 - 99 mg/dL Final  . Comment 1 02/08/2017 Notify RN   Final  . Glucose-Capillary 02/08/2017 101* 65 - 99 mg/dL Final  . Comment 1 02/08/2017 Notify RN   Final  . Glucose-Capillary 02/08/2017 137* 65 - 99 mg/dL Final  . Glucose-Capillary 02/09/2017 99  65 - 99 mg/dL Final  . Glucose-Capillary 02/09/2017 210* 65 - 99 mg/dL Final  .  Glucose-Capillary 02/09/2017 274* 65 - 99 mg/dL Final  . Glucose-Capillary 02/09/2017 155* 65 - 99 mg/dL Final  . Comment 1 02/09/2017 Notify RN   Final  . Glucose-Capillary 02/10/2017 123* 65 - 99 mg/dL Final  . Glucose-Capillary 02/10/2017 253* 65 - 99 mg/dL Final  . Comment 1 02/10/2017 Notify RN   Final  . Glucose-Capillary 02/10/2017 285* 65 - 99 mg/dL Final  . Glucose-Capillary 02/10/2017 171* 65 - 99 mg/dL Final  . Glucose-Capillary 02/11/2017 142* 65 - 99 mg/dL Final  . Glucose-Capillary 02/11/2017  180* 65 - 99 mg/dL Final  . Glucose-Capillary 02/11/2017 115* 65 - 99 mg/dL Final  . Glucose-Capillary 02/11/2017 158* 65 - 99 mg/dL Final  . Glucose-Capillary 02/12/2017 108* 65 - 99 mg/dL Final  . Glucose-Capillary 02/12/2017 176* 65 - 99 mg/dL Final  . Glucose-Capillary 02/12/2017 189* 65 - 99 mg/dL Final  . Glucose-Capillary 02/12/2017 156* 65 - 99 mg/dL Final  . Glucose-Capillary 02/13/2017 164* 65 - 99 mg/dL Final  . Glucose-Capillary 02/13/2017 133* 65 - 99 mg/dL Final  . Comment 1 02/13/2017 Notify RN   Final  . Glucose-Capillary 02/13/2017 171* 65 - 99 mg/dL Final  . Glucose-Capillary 02/13/2017 186* 65 - 99 mg/dL Final  . Comment 1 02/13/2017 Notify RN   Final  . Glucose-Capillary 02/14/2017 168* 65 - 99 mg/dL Final  . Glucose-Capillary 02/14/2017 148* 65 - 99 mg/dL Final  . Comment 1 02/14/2017 Notify RN   Final  . Glucose-Capillary 02/14/2017 135* 65 - 99 mg/dL Final  . Comment 1 02/14/2017 Notify RN   Final  . Glucose-Capillary 02/14/2017 157* 65 - 99 mg/dL Final  . Glucose-Capillary 02/15/2017 118* 65 - 99 mg/dL Final  . Sodium 02/15/2017 137  135 - 145 mmol/L Final  . Potassium 02/15/2017 3.8  3.5 - 5.1 mmol/L Final  . Chloride 02/15/2017 104  101 - 111 mmol/L Final  . CO2 02/15/2017 23  22 - 32 mmol/L Final  . Glucose, Bld 02/15/2017 134* 65 - 99 mg/dL Final  . BUN 02/15/2017 37* 6 - 20 mg/dL Final  . Creatinine, Ser 02/15/2017 1.34* 0.44 - 1.00 mg/dL Final  .  Calcium 02/15/2017 9.6  8.9 - 10.3 mg/dL Final  . GFR calc non Af Amer 02/15/2017 40* >60 mL/min Final  . GFR calc Af Amer 02/15/2017 46* >60 mL/min Final   Comment: (NOTE) The eGFR has been calculated using the CKD EPI equation. This calculation has not been validated in all clinical situations. eGFR's persistently <60 mL/min signify possible Chronic Kidney Disease.   . Anion gap 02/15/2017 10  5 - 15 Final  . Glucose-Capillary 02/15/2017 200* 65 - 99 mg/dL Final  Admission on 01/26/2017, Discharged on 02/06/2017  No results displayed because visit has over 200 results.      No results found.   Assessment/Plan   ICD-10-CM   1. Mixed hyperlipidemia E78.2 Lipid Panel  2. Type 2 diabetes mellitus with stage 3 chronic kidney disease, with long-term current use of insulin (HCC) E11.22    N18.3    Z79.4   3. S/P colostomy ([place]) Z93.3   4. Need for immunization against influenza Z23 Flu Vaccine QUAD 6+ mos PF IM (Fluarix Quad PF)   Continue current medications as ordered  Follow up with specialists as scheduled  Will call with lab results  Influenza vaccine given today  AVOID PORK PRODUCTS, RED MEAT AND FISH  Follow up in 4 mos for DM, HTN, hyperlipidemia, gout    Carolyn Sylvia S. Perlie Gold  [facility] [address] [place], [place] [address] [phone] Cell (Monday-Friday 8 AM - 5 PM) [phone] After 5 PM and follow prompts

## 2017-04-27 ENCOUNTER — Encounter: Payer: Self-pay | Admitting: *Deleted

## 2017-05-02 DIAGNOSIS — N183 Chronic kidney disease, stage 3 (moderate): Secondary | ICD-10-CM | POA: Diagnosis not present

## 2017-05-02 DIAGNOSIS — R300 Dysuria: Secondary | ICD-10-CM | POA: Diagnosis not present

## 2017-05-02 DIAGNOSIS — Z933 Colostomy status: Secondary | ICD-10-CM | POA: Diagnosis not present

## 2017-05-06 DIAGNOSIS — R1110 Vomiting, unspecified: Secondary | ICD-10-CM | POA: Diagnosis not present

## 2017-05-06 DIAGNOSIS — I10 Essential (primary) hypertension: Secondary | ICD-10-CM | POA: Diagnosis not present

## 2017-05-06 DIAGNOSIS — R110 Nausea: Secondary | ICD-10-CM | POA: Diagnosis not present

## 2017-05-06 DIAGNOSIS — R42 Dizziness and giddiness: Secondary | ICD-10-CM | POA: Diagnosis not present

## 2017-05-06 DIAGNOSIS — R112 Nausea with vomiting, unspecified: Secondary | ICD-10-CM | POA: Diagnosis not present

## 2017-05-06 DIAGNOSIS — E119 Type 2 diabetes mellitus without complications: Secondary | ICD-10-CM | POA: Diagnosis not present

## 2017-05-06 DIAGNOSIS — Z7951 Long term (current) use of inhaled steroids: Secondary | ICD-10-CM | POA: Diagnosis not present

## 2017-05-06 DIAGNOSIS — Z79899 Other long term (current) drug therapy: Secondary | ICD-10-CM | POA: Diagnosis not present

## 2017-05-06 DIAGNOSIS — E039 Hypothyroidism, unspecified: Secondary | ICD-10-CM | POA: Diagnosis not present

## 2017-05-06 DIAGNOSIS — R05 Cough: Secondary | ICD-10-CM | POA: Diagnosis not present

## 2017-05-06 DIAGNOSIS — Z8639 Personal history of other endocrine, nutritional and metabolic disease: Secondary | ICD-10-CM | POA: Diagnosis not present

## 2017-05-08 ENCOUNTER — Other Ambulatory Visit: Payer: Self-pay | Admitting: General Surgery

## 2017-05-10 ENCOUNTER — Other Ambulatory Visit: Payer: Self-pay | Admitting: General Surgery

## 2017-05-10 DIAGNOSIS — Z933 Colostomy status: Secondary | ICD-10-CM

## 2017-05-15 ENCOUNTER — Ambulatory Visit
Admission: RE | Admit: 2017-05-15 | Discharge: 2017-05-15 | Disposition: A | Discharge status: Home / Self Care | Payer: Medicare Other | Source: Ambulatory Visit | Attending: General Surgery | Admitting: General Surgery

## 2017-05-15 DIAGNOSIS — Z933 Colostomy status: Secondary | ICD-10-CM

## 2017-05-15 DIAGNOSIS — Z980 Intestinal bypass and anastomosis status: Secondary | ICD-10-CM | POA: Diagnosis not present

## 2017-05-19 ENCOUNTER — Ambulatory Visit: Payer: Self-pay | Admitting: General Surgery

## 2017-05-19 DIAGNOSIS — Z933 Colostomy status: Secondary | ICD-10-CM | POA: Diagnosis not present

## 2017-06-14 ENCOUNTER — Other Ambulatory Visit: Payer: Self-pay | Admitting: Internal Medicine

## 2017-06-14 DIAGNOSIS — E1122 Type 2 diabetes mellitus with diabetic chronic kidney disease: Secondary | ICD-10-CM

## 2017-06-14 DIAGNOSIS — N183 Chronic kidney disease, stage 3 unspecified: Secondary | ICD-10-CM

## 2017-06-14 DIAGNOSIS — Z794 Long term (current) use of insulin: Principal | ICD-10-CM

## 2017-07-04 NOTE — Pre-Procedure Instructions (Addendum)
Crystal [name]  07/04/2017      [id number] [name], [place] [address] Phone: [phone] Fax: [phone]  [facility] [place], [place] [address] Phone: [phone] Fax: [phone]  [id number] [name], [place] [address] [place] [address] Phone: [phone] Fax: [phone]    Your procedure is scheduled on Thursday, July 13, 2017  Report to [facility] Admitting Entrance "A" at 5:30AM   Call this number if you have problems the morning of surgery:  [phone]   Remember:  Do not eat food or drink liquids after midnight.  Take these medicines the morning of surgery with A SIP OF WATER: Carvedilol (COREG), Gabapentin (NEURONTIN), Levothyroxine (SYNTHROID, LEVOTHROID), Loratadine (CLARITIN), and ULORIC. If needed TraMADol [name]) for pain.  7 days before surgery (Dec. 13), stop taking all Aspirins, Vitamins, Fish oils, and Herbal medications. Also stop all NSAIDS i.e. Advil, Ibuprofen, Motrin, Aleve, Anaprox, Naproxen, BC and Goody Powders.  Please complete your PRE-SURGERY ENSURE that was given to before you leave your house the morning of surgery.  Please, if able, drink it in one setting. DO NOT SIP.  How to Manage Your Diabetes Before and After Surgery  Why is it important to control my blood sugar before and after surgery? . Improving blood sugar levels before and after surgery helps healing and can limit problems. . A way of improving blood sugar control is eating a healthy diet by: o  Eating less sugar and carbohydrates o  Increasing activity/exercise o  Talking with your doctor about reaching your blood sugar goals . High blood sugars (greater than 180 mg/dL) can raise your risk of infections  and slow your recovery, so you will need to focus on controlling your diabetes during the weeks before surgery. . Make sure that the doctor who takes care of your diabetes knows about your planned surgery including the date and location.  How do I manage my blood sugar before surgery? . Check your blood sugar at least 4 times a day, starting 2 days before surgery, to make sure that the level is not too high or low. o Check your blood sugar the morning of your surgery when you wake up and every 2 hours until you get to the Short Stay unit. . If your blood sugar is less than 70 mg/dL, you will need to treat for low blood sugar: o Do not take insulin. o Treat a low blood sugar (less than 70 mg/dL) with  cup of clear juice (cranberry or apple), 4 glucose tablets, OR glucose gel. Recheck blood sugar in 15 minutes after treatment (to make sure it is greater than 70 mg/dL). If your blood sugar is not greater than 70 mg/dL on recheck, call [phone] o  for further instructions. . Report your blood sugar to the short stay nurse when you get to Short Stay.  . If you are admitted to the hospital after surgery: o Your blood sugar will be checked by the staff and you will probably be given insulin after surgery (instead of oral diabetes medicines) to make sure you have good blood sugar levels. o The goal for blood sugar control after surgery is 80-180 mg/dL.  WHAT DO I DO ABOUT MY DIABETES MEDICATION?  Marland Kitchen  THE MORNING AND AFTERNOON BEFORE SURGERY, take _____20_____ units of _____APIDRA______insulin. Do Not take the dinner dose.  . THE NIGHT BEFORE SURGERY, take ______50_______ units of ____TOUJEO______insulin.  . If your CBG is greater than 220 mg/dL, you may take  of your sliding scale (correction) dose of insulin.   Do not wear jewelry, make-up or nail polish.  Do not wear lotions, powders, perfumes, or deodorant.  Do not shave 48 hours prior to surgery.    Do not bring valuables to the  hospital.  [facility] is not responsible for any belongings or valuables.  Contacts, dentures or bridgework may not be worn into surgery.  Leave your suitcase in the car.  After surgery it may be brought to your room.  For patients admitted to the hospital, discharge time will be determined by your treatment team.  Patients discharged the day of surgery will not be allowed to drive home.   Special instructions:   [place]- Preparing For Surgery  Before surgery, you can play an important role. Because skin is not sterile, your skin needs to be as free of germs as possible. You can reduce the number of germs on your skin by washing with CHG (chlorahexidine gluconate) Soap before surgery.  CHG is an antiseptic cleaner which kills germs and bonds with the skin to continue killing germs even after washing.  Please do not use if you have an allergy to CHG or antibacterial soaps. If your skin becomes reddened/irritated stop using the CHG.  Do not shave (including legs and underarms) for at least 48 hours prior to first CHG shower. It is OK to shave your face.  Please follow these instructions carefully.   1. Shower the NIGHT BEFORE SURGERY and the MORNING OF SURGERY with CHG.   2. If you chose to wash your hair, wash your hair first as usual with your normal shampoo.  3. After you shampoo, rinse your hair and body thoroughly to remove the shampoo.  4. Use CHG as you would any other liquid soap. You can apply CHG directly to the skin and wash gently with a scrungie or a clean washcloth.   5. Apply the CHG Soap to your body ONLY FROM THE NECK DOWN.  Do not use on open wounds or open sores. Avoid contact with your eyes, ears, mouth and genitals (private parts). Wash Face and genitals (private parts)  with your normal soap.  6. Wash thoroughly, paying special attention to the area where your surgery will be performed.  7. Thoroughly rinse your body with warm water from the neck down.  8. DO  NOT shower/wash with your normal soap after using and rinsing off the CHG Soap.  9. Pat yourself dry with a CLEAN TOWEL.  10. Wear CLEAN PAJAMAS to bed the night before surgery, wear comfortable clothes the morning of surgery  11. Place CLEAN SHEETS on your bed the night of your first shower and DO NOT SLEEP WITH PETS.  Day of Surgery: Do not apply any deodorants/lotions. Please wear clean clothes to the hospital/surgery center.    Please read over the following fact sheets that you were given. Pain Booklet, Coughing and Deep Breathing, Blood Transfusion Information, MRSA Information and Surgical Site Infection Prevention

## 2017-07-05 ENCOUNTER — Other Ambulatory Visit: Payer: Self-pay

## 2017-07-05 ENCOUNTER — Encounter (HOSPITAL_COMMUNITY): Payer: Self-pay

## 2017-07-05 ENCOUNTER — Encounter (HOSPITAL_COMMUNITY)
Admission: RE | Admit: 2017-07-05 | Discharge: 2017-07-05 | Disposition: A | Discharge status: Home / Self Care | Payer: Medicare Other | Source: Ambulatory Visit | Attending: General Surgery | Admitting: General Surgery

## 2017-07-05 DIAGNOSIS — I447 Left bundle-branch block, unspecified: Secondary | ICD-10-CM | POA: Insufficient documentation

## 2017-07-05 DIAGNOSIS — E785 Hyperlipidemia, unspecified: Secondary | ICD-10-CM | POA: Diagnosis not present

## 2017-07-05 DIAGNOSIS — E1122 Type 2 diabetes mellitus with diabetic chronic kidney disease: Secondary | ICD-10-CM | POA: Insufficient documentation

## 2017-07-05 DIAGNOSIS — G4733 Obstructive sleep apnea (adult) (pediatric): Secondary | ICD-10-CM | POA: Diagnosis not present

## 2017-07-05 DIAGNOSIS — I509 Heart failure, unspecified: Secondary | ICD-10-CM | POA: Diagnosis not present

## 2017-07-05 DIAGNOSIS — D509 Iron deficiency anemia, unspecified: Secondary | ICD-10-CM | POA: Insufficient documentation

## 2017-07-05 DIAGNOSIS — Z01812 Encounter for preprocedural laboratory examination: Secondary | ICD-10-CM | POA: Diagnosis not present

## 2017-07-05 DIAGNOSIS — I130 Hypertensive heart and chronic kidney disease with heart failure and stage 1 through stage 4 chronic kidney disease, or unspecified chronic kidney disease: Secondary | ICD-10-CM | POA: Diagnosis not present

## 2017-07-05 DIAGNOSIS — I2510 Atherosclerotic heart disease of native coronary artery without angina pectoris: Secondary | ICD-10-CM | POA: Diagnosis not present

## 2017-07-05 DIAGNOSIS — N189 Chronic kidney disease, unspecified: Secondary | ICD-10-CM | POA: Insufficient documentation

## 2017-07-05 DIAGNOSIS — Z9981 Dependence on supplemental oxygen: Secondary | ICD-10-CM | POA: Insufficient documentation

## 2017-07-05 HISTORY — DX: Presence of automatic (implantable) cardiac defibrillator: Z95.810

## 2017-07-05 HISTORY — DX: Other specified postprocedural states: Z98.890

## 2017-07-05 HISTORY — DX: Atherosclerotic heart disease of native coronary artery without angina pectoris: I25.10

## 2017-07-05 HISTORY — DX: Family history of other specified conditions: Z84.89

## 2017-07-05 HISTORY — DX: Nausea with vomiting, unspecified: R11.2

## 2017-07-05 HISTORY — DX: Acute myocardial infarction, unspecified: I21.9

## 2017-07-05 LAB — CBC WITH DIFFERENTIAL/PLATELET
BASOS ABS: 0 10*3/uL (ref 0.0–0.1)
BASOS PCT: 0 %
EOS ABS: 0.4 10*3/uL (ref 0.0–0.7)
Eosinophils Relative: 6 %
HCT: 38.1 % (ref 36.0–46.0)
Hemoglobin: 12.4 g/dL (ref 12.0–15.0)
Lymphocytes Relative: 34 %
Lymphs Abs: 2.4 10*3/uL (ref 0.7–4.0)
MCH: 26.4 pg (ref 26.0–34.0)
MCHC: 32.5 g/dL (ref 30.0–36.0)
MCV: 81.2 fL (ref 78.0–100.0)
MONO ABS: 0.6 10*3/uL (ref 0.1–1.0)
Monocytes Relative: 9 %
NEUTROS ABS: 3.7 10*3/uL (ref 1.7–7.7)
NEUTROS PCT: 51 %
Platelets: 197 10*3/uL (ref 150–400)
RBC: 4.69 MIL/uL (ref 3.87–5.11)
RDW: 15.6 % — AB (ref 11.5–15.5)
WBC: 7.2 10*3/uL (ref 4.0–10.5)

## 2017-07-05 LAB — GLUCOSE, CAPILLARY: GLUCOSE-CAPILLARY: 185 mg/dL — AB (ref 65–99)

## 2017-07-05 LAB — TYPE AND SCREEN
ABO/RH(D): B POS
Antibody Screen: NEGATIVE

## 2017-07-05 LAB — COMPREHENSIVE METABOLIC PANEL
ALBUMIN: 3.4 g/dL — AB (ref 3.5–5.0)
ALT: 38 U/L (ref 14–54)
AST: 33 U/L (ref 15–41)
Alkaline Phosphatase: 101 U/L (ref 38–126)
Anion gap: 8 (ref 5–15)
BUN: 24 mg/dL — AB (ref 6–20)
CALCIUM: 9.4 mg/dL (ref 8.9–10.3)
CHLORIDE: 101 mmol/L (ref 101–111)
CO2: 27 mmol/L (ref 22–32)
Creatinine, Ser: 1.26 mg/dL — ABNORMAL HIGH (ref 0.44–1.00)
GFR calc Af Amer: 50 mL/min — ABNORMAL LOW (ref >60)
GFR calc non Af Amer: 43 mL/min — ABNORMAL LOW (ref >60)
GLUCOSE: 197 mg/dL — AB (ref 65–99)
POTASSIUM: 4.4 mmol/L (ref 3.5–5.1)
SODIUM: 136 mmol/L (ref 135–145)
TOTAL PROTEIN: 7.4 g/dL (ref 6.5–8.1)
Total Bilirubin: 0.7 mg/dL (ref 0.3–1.2)

## 2017-07-05 LAB — PROTIME-INR
INR: 1.09
PROTHROMBIN TIME: 14 s (ref 11.4–15.2)

## 2017-07-05 LAB — HEMOGLOBIN A1C
HEMOGLOBIN A1C: 8.4 % — AB (ref 4.8–5.6)
Mean Plasma Glucose: 194.38 mg/dL

## 2017-07-05 NOTE — Progress Notes (Signed)
PCP - Dr. Gildardo Cranker  Cardiologist - Dr. Elizabeth Palau, [place], [place]  Chest x-ray - 02/01/17 1 view (E)  EKG - 02/04/17 (E)  Stress Test - 08/17/16 (E) Requested another from Dr. Elizabeth Palau  ECHO - 03/31/16 (E) Requested another from Dr. Elizabeth Palau  Cardiac Cath - 2005 and earlier this year Requested from Dr. Elizabeth Palau  Sleep Study - Yes CPAP - Yes and uses O2 at 2L along with machine  LABS- Pending 07/05/17: CBC w/D, CMP, PT, HA1C, T/S  HA1C- Pending 07/05/17 Fasting Blood Sugar - 110-120, Today 183 Checks Blood Sugar ____2_ times a day  Anesthesia- Yes- Cardiac history and surgical orders.  Pt sts the ICD has not been on for the past 7 yrs due to the battery dying. She sts her doctors says her heart is fine, so there is no need to turn it back on.  Pt denies having chest pain, sob, or fever at this time. All instructions explained to the pt, with a verbal understanding of the material. Pt agrees to go over the instructions while at home for a better understanding. The opportunity to ask questions was provided.

## 2017-07-05 NOTE — Progress Notes (Addendum)
Anesthesia Chart Review:  Pt is a 66 year old female scheduled for colostomy reversal with Judeth Horn, MD  - PCP is Gildardo Cranker, DO - Used to see cardiologist Laymond Purser, MD in [place], [place]  PMH includes:  CAD (30% LAD, 30% RCA by 09/01/16 cath), nonischemic cardiomyopathy (EF recovered to 55% on 01/19/17 echo), CHF, AICD (no longer functioning), LBBB, HTN, DM, hyperlipidemia, COPD, OSA (uses CPAP and 2L O2 at night), iron deficiency anemia, CKD, hypothyroidism. Never smoker. 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), upper GI bleed, acute respiratory failure with hypoxia (required intubation, ICU stay), septic shock, acute on chronic kidney failure, acute blood loss anemia (s/p 3 units PRBCs), UTI. Discharged to rehab.   Medications include: Carvedilol, Crestor, iron, Toujeo, apidra, levothyroxine, Protonix, telmisartan  BP (!) 126/47   Pulse 75   Temp 36.9 C   Resp 20   Ht 5\' 5"  (1.651 m)   Wt 210 lb (95.3 kg)   SpO2 96%   BMI 34.95 kg/m   Preoperative labs reviewed.   - HbA1c pending. Glucose 197.   1 view CXR 02/01/17:  - No acute cardiopulmonary abnormality seen.  EKG 02/04/17: Sinus tachycardia (102 bpm) with occasional PVCs. LAD. LBBB.  Echo 01/19/17 ([place], [place]; found in correspondence 02/07/17 p. 145 in media tab): 1. Mild the dilated LV with normal function. EF 55%. Severe concentric hypertrophy. 2. Normal RV size with normal function.  Cardiac cath 09/01/16 ([place]): 1. No significant obstructive CAD (mid and distal LAD 30%; proximal RCA 30%) 2. Mildly dilated LV with low normal EF at baseline with appropriate augmentation on post PVC beats. 3. Evidence of biventricular ICD with an epicardial LV lead  Nuclear stress test 08/17/16 (Dr. Sonia Side office; found in correspondence 04/05/17 in media  tab) - Lexiscan cardiolite stress test with SPECT imaging reveals a moderate sized, moderate grade partially reversible abnormality involving the mid and distal LV. This is suggestive of myocardium at ischemic risk or could be due to underlying cardiomyopathy.   - Mildly reduced LV systolic function. EF 42%.   If no changes, I anticipate pt can proceed with surgery as scheduled.   Willeen Cass, FNP-BC [facility] Short Stay Surgical Center/Anesthesiology Phone: [phone] 07/05/2017 4:09 PM

## 2017-07-13 ENCOUNTER — Other Ambulatory Visit: Payer: Self-pay

## 2017-07-13 ENCOUNTER — Inpatient Hospital Stay (HOSPITAL_COMMUNITY): Payer: Medicare Other | Admitting: Certified Registered"

## 2017-07-13 ENCOUNTER — Inpatient Hospital Stay (HOSPITAL_COMMUNITY): Payer: Medicare Other | Admitting: Emergency Medicine

## 2017-07-13 ENCOUNTER — Encounter (HOSPITAL_COMMUNITY): Payer: Self-pay | Admitting: Certified Registered"

## 2017-07-13 ENCOUNTER — Encounter: Payer: Self-pay | Admitting: Internal Medicine

## 2017-07-13 ENCOUNTER — Inpatient Hospital Stay (HOSPITAL_COMMUNITY)
Admission: RE | Admit: 2017-07-13 | Discharge: 2017-07-17 | DRG: 331 | Disposition: A | Discharge status: Home / Self Care | Payer: Medicare Other | Attending: General Surgery | Admitting: General Surgery

## 2017-07-13 ENCOUNTER — Encounter (HOSPITAL_COMMUNITY)
Admission: RE | Disposition: A | Discharge status: Home / Self Care | Payer: Self-pay | Source: Home / Self Care | Attending: General Surgery

## 2017-07-13 DIAGNOSIS — Z79899 Other long term (current) drug therapy: Secondary | ICD-10-CM

## 2017-07-13 DIAGNOSIS — Z882 Allergy status to sulfonamides status: Secondary | ICD-10-CM

## 2017-07-13 DIAGNOSIS — Z9049 Acquired absence of other specified parts of digestive tract: Secondary | ICD-10-CM | POA: Diagnosis not present

## 2017-07-13 DIAGNOSIS — Z933 Colostomy status: Secondary | ICD-10-CM | POA: Diagnosis not present

## 2017-07-13 DIAGNOSIS — Z6834 Body mass index (BMI) 34.0-34.9, adult: Secondary | ICD-10-CM

## 2017-07-13 DIAGNOSIS — Z886 Allergy status to analgesic agent status: Secondary | ICD-10-CM

## 2017-07-13 DIAGNOSIS — Z23 Encounter for immunization: Secondary | ICD-10-CM

## 2017-07-13 DIAGNOSIS — Z8619 Personal history of other infectious and parasitic diseases: Secondary | ICD-10-CM | POA: Diagnosis not present

## 2017-07-13 DIAGNOSIS — Z433 Encounter for attention to colostomy: Secondary | ICD-10-CM | POA: Diagnosis not present

## 2017-07-13 DIAGNOSIS — K660 Peritoneal adhesions (postprocedural) (postinfection): Secondary | ICD-10-CM | POA: Diagnosis present

## 2017-07-13 DIAGNOSIS — Z881 Allergy status to other antibiotic agents status: Secondary | ICD-10-CM | POA: Diagnosis not present

## 2017-07-13 DIAGNOSIS — Z888 Allergy status to other drugs, medicaments and biological substances status: Secondary | ICD-10-CM | POA: Diagnosis not present

## 2017-07-13 DIAGNOSIS — K388 Other specified diseases of appendix: Secondary | ICD-10-CM | POA: Diagnosis not present

## 2017-07-13 HISTORY — DX: Chronic kidney disease, stage 3 (moderate): N18.3

## 2017-07-13 HISTORY — DX: Pneumonia, unspecified organism: J18.9

## 2017-07-13 HISTORY — DX: Dependence on supplemental oxygen: Z99.81

## 2017-07-13 HISTORY — DX: Obstructive sleep apnea (adult) (pediatric): G47.33

## 2017-07-13 HISTORY — DX: Unspecified osteoarthritis, unspecified site: M19.90

## 2017-07-13 HISTORY — DX: Type 2 diabetes mellitus with diabetic polyneuropathy: E11.42

## 2017-07-13 HISTORY — DX: Low back pain: M54.5

## 2017-07-13 HISTORY — DX: Other chronic pain: G89.29

## 2017-07-13 HISTORY — PX: COLOSTOMY REVERSAL: SHX5782

## 2017-07-13 HISTORY — DX: Chronic kidney disease, stage 3 unspecified: N18.30

## 2017-07-13 HISTORY — DX: Dependence on other enabling machines and devices: Z99.89

## 2017-07-13 HISTORY — PX: APPENDECTOMY: SHX54

## 2017-07-13 HISTORY — DX: Anxiety disorder, unspecified: F41.9

## 2017-07-13 HISTORY — DX: Low back pain, unspecified: M54.50

## 2017-07-13 HISTORY — DX: Gastro-esophageal reflux disease without esophagitis: K21.9

## 2017-07-13 HISTORY — DX: Personal history of other medical treatment: Z92.89

## 2017-07-13 LAB — CBC
HCT: 36.4 % (ref 36.0–46.0)
Hemoglobin: 11.7 g/dL — ABNORMAL LOW (ref 12.0–15.0)
MCH: 26.3 pg (ref 26.0–34.0)
MCHC: 32.1 g/dL (ref 30.0–36.0)
MCV: 81.8 fL (ref 78.0–100.0)
Platelets: 198 10*3/uL (ref 150–400)
RBC: 4.45 MIL/uL (ref 3.87–5.11)
RDW: 15.6 % — ABNORMAL HIGH (ref 11.5–15.5)
WBC: 11.9 10*3/uL — AB (ref 4.0–10.5)

## 2017-07-13 LAB — GLUCOSE, CAPILLARY
GLUCOSE-CAPILLARY: 283 mg/dL — AB (ref 65–99)
Glucose-Capillary: 214 mg/dL — ABNORMAL HIGH (ref 65–99)
Glucose-Capillary: 279 mg/dL — ABNORMAL HIGH (ref 65–99)
Glucose-Capillary: 194 mg/dL — ABNORMAL HIGH (ref 65–99)

## 2017-07-13 LAB — MRSA PCR SCREENING: MRSA BY PCR: NEGATIVE

## 2017-07-13 LAB — CREATININE, SERUM
CREATININE: 1.58 mg/dL — AB (ref 0.44–1.00)
GFR, EST AFRICAN AMERICAN: 38 mL/min — AB (ref >60)
GFR, EST NON AFRICAN AMERICAN: 33 mL/min — AB (ref >60)

## 2017-07-13 SURGERY — COLOSTOMY REVERSAL
Anesthesia: General | Site: Abdomen

## 2017-07-13 MED ORDER — LIDOCAINE 2% (20 MG/ML) 5 ML SYRINGE
INTRAMUSCULAR | Status: DC | PRN
  Administered 2017-07-13: 100 mg via INTRAVENOUS

## 2017-07-13 MED ORDER — ENOXAPARIN SODIUM 40 MG/0.4ML ~~LOC~~ SOLN
40.0000 mg | SUBCUTANEOUS | Status: DC
  Administered 2017-07-14 – 2017-07-17 (×4): 40 mg via SUBCUTANEOUS
  Filled 2017-07-13 (×4): qty 0.4

## 2017-07-13 MED ORDER — ENOXAPARIN SODIUM 40 MG/0.4ML ~~LOC~~ SOLN
40.0000 mg | Freq: Once | SUBCUTANEOUS | Status: AC
  Administered 2017-07-13: 40 mg via SUBCUTANEOUS
  Filled 2017-07-13 (×2): qty 0.4

## 2017-07-13 MED ORDER — DEXTROSE 5 % IV SOLN
2.0000 g | Freq: Two times a day (BID) | INTRAVENOUS | Status: AC
  Administered 2017-07-13: 2 g via INTRAVENOUS
  Filled 2017-07-13 (×2): qty 2

## 2017-07-13 MED ORDER — POTASSIUM CHLORIDE IN NACL 20-0.9 MEQ/L-% IV SOLN
INTRAVENOUS | Status: DC
  Administered 2017-07-13: 15:00:00 via INTRAVENOUS
  Administered 2017-07-13: 1000 mL via INTRAVENOUS
  Administered 2017-07-14 – 2017-07-17 (×5): via INTRAVENOUS
  Filled 2017-07-13 (×8): qty 1000

## 2017-07-13 MED ORDER — ONDANSETRON HCL 4 MG PO TABS: 4.0000 mg | ORAL_TABLET | Freq: Four times a day (QID) | ORAL | Status: DC | PRN

## 2017-07-13 MED ORDER — MONTELUKAST SODIUM 10 MG PO TABS
10.0000 mg | ORAL_TABLET | Freq: Every day | ORAL | Status: DC
  Administered 2017-07-14 – 2017-07-16 (×3): 10 mg via ORAL
  Filled 2017-07-13 (×4): qty 1

## 2017-07-13 MED ORDER — ROSUVASTATIN CALCIUM 10 MG PO TABS
10.0000 mg | ORAL_TABLET | Freq: Every day | ORAL | Status: DC
  Administered 2017-07-14 – 2017-07-16 (×3): 10 mg via ORAL
  Filled 2017-07-13 (×4): qty 1

## 2017-07-13 MED ORDER — SCOPOLAMINE 1 MG/3DAYS TD PT72
MEDICATED_PATCH | TRANSDERMAL | Status: DC | PRN
  Administered 2017-07-13: 1 via TRANSDERMAL

## 2017-07-13 MED ORDER — DEXAMETHASONE SODIUM PHOSPHATE 10 MG/ML IJ SOLN
INTRAMUSCULAR | Status: DC | PRN
  Administered 2017-07-13: 5 mg via INTRAVENOUS

## 2017-07-13 MED ORDER — CEFOTETAN DISODIUM-DEXTROSE 2-2.08 GM-%(50ML) IV SOLR
2.0000 g | INTRAVENOUS | Status: AC
  Administered 2017-07-13: 2 g via INTRAVENOUS
  Filled 2017-07-13: qty 50

## 2017-07-13 MED ORDER — HYDROMORPHONE HCL 1 MG/ML IJ SOLN: 0.5000 mg | INTRAMUSCULAR | Status: DC | PRN

## 2017-07-13 MED ORDER — PNEUMOCOCCAL VAC POLYVALENT 25 MCG/0.5ML IJ INJ
0.5000 mL | INJECTION | INTRAMUSCULAR | Status: AC
  Administered 2017-07-14: 0.5 mL via INTRAMUSCULAR
  Filled 2017-07-13: qty 0.5

## 2017-07-13 MED ORDER — SACCHAROMYCES BOULARDII 250 MG PO CAPS
250.0000 mg | ORAL_CAPSULE | Freq: Two times a day (BID) | ORAL | Status: DC
  Administered 2017-07-13 – 2017-07-17 (×9): 250 mg via ORAL
  Filled 2017-07-13 (×9): qty 1

## 2017-07-13 MED ORDER — KETOROLAC TROMETHAMINE 30 MG/ML IJ SOLN
INTRAMUSCULAR | Status: AC
  Filled 2017-07-13: qty 1

## 2017-07-13 MED ORDER — ESCITALOPRAM OXALATE 10 MG PO TABS
10.0000 mg | ORAL_TABLET | Freq: Every day | ORAL | Status: DC
  Administered 2017-07-14 – 2017-07-16 (×3): 10 mg via ORAL
  Filled 2017-07-13 (×4): qty 1

## 2017-07-13 MED ORDER — ROCURONIUM BROMIDE 10 MG/ML (PF) SYRINGE
PREFILLED_SYRINGE | INTRAVENOUS | Status: DC | PRN
  Administered 2017-07-13: 50 mg via INTRAVENOUS
  Administered 2017-07-13 (×2): 10 mg via INTRAVENOUS

## 2017-07-13 MED ORDER — ALVIMOPAN 12 MG PO CAPS
12.0000 mg | ORAL_CAPSULE | Freq: Two times a day (BID) | ORAL | Status: DC
  Administered 2017-07-14 – 2017-07-16 (×5): 12 mg via ORAL
  Filled 2017-07-13 (×6): qty 1

## 2017-07-13 MED ORDER — HYDROMORPHONE HCL 1 MG/ML IJ SOLN
0.2500 mg | INTRAMUSCULAR | Status: DC | PRN
  Administered 2017-07-13 (×2): 0.5 mg via INTRAVENOUS

## 2017-07-13 MED ORDER — LACTATED RINGERS IV SOLN
INTRAVENOUS | Status: DC | PRN
  Administered 2017-07-13 (×3): via INTRAVENOUS

## 2017-07-13 MED ORDER — ACETAMINOPHEN 500 MG PO TABS
1000.0000 mg | ORAL_TABLET | ORAL | Status: AC
  Administered 2017-07-13: 1000 mg via ORAL
  Filled 2017-07-13: qty 2

## 2017-07-13 MED ORDER — MEPERIDINE HCL 25 MG/ML IJ SOLN: 6.2500 mg | INTRAMUSCULAR | Status: DC | PRN

## 2017-07-13 MED ORDER — 0.9 % SODIUM CHLORIDE (POUR BTL) OPTIME
TOPICAL | Status: DC | PRN
  Administered 2017-07-13 (×3): 1000 mL

## 2017-07-13 MED ORDER — LEVOTHYROXINE SODIUM 88 MCG PO TABS
88.0000 ug | ORAL_TABLET | Freq: Every day | ORAL | Status: DC
  Administered 2017-07-14 – 2017-07-17 (×4): 88 ug via ORAL
  Filled 2017-07-13 (×5): qty 1

## 2017-07-13 MED ORDER — FENTANYL CITRATE (PF) 100 MCG/2ML IJ SOLN
INTRAMUSCULAR | Status: DC | PRN
  Administered 2017-07-13 (×3): 50 ug via INTRAVENOUS

## 2017-07-13 MED ORDER — PROPOFOL 10 MG/ML IV BOLUS
INTRAVENOUS | Status: DC | PRN
  Administered 2017-07-13: 180 mg via INTRAVENOUS

## 2017-07-13 MED ORDER — ALUM & MAG HYDROXIDE-SIMETH 200-200-20 MG/5ML PO SUSP: 30.0000 mL | Freq: Four times a day (QID) | ORAL | Status: DC | PRN

## 2017-07-13 MED ORDER — GABAPENTIN 400 MG PO CAPS
800.0000 mg | ORAL_CAPSULE | Freq: Every day | ORAL | Status: DC
  Administered 2017-07-13 – 2017-07-16 (×4): 800 mg via ORAL
  Filled 2017-07-13 (×4): qty 2

## 2017-07-13 MED ORDER — GABAPENTIN 100 MG PO CAPS
200.0000 mg | ORAL_CAPSULE | Freq: Once | ORAL | Status: AC
  Administered 2017-07-13: 200 mg via ORAL
  Filled 2017-07-13: qty 2

## 2017-07-13 MED ORDER — GABAPENTIN 100 MG PO CAPS
100.0000 mg | ORAL_CAPSULE | Freq: Every morning | ORAL | Status: DC
  Administered 2017-07-14 – 2017-07-17 (×4): 100 mg via ORAL
  Filled 2017-07-13 (×4): qty 1

## 2017-07-13 MED ORDER — INSULIN ASPART 100 UNIT/ML ~~LOC~~ SOLN
0.0000 [IU] | Freq: Three times a day (TID) | SUBCUTANEOUS | Status: DC
  Administered 2017-07-13: 11 [IU] via SUBCUTANEOUS
  Administered 2017-07-14: 7 [IU] via SUBCUTANEOUS
  Administered 2017-07-14: 11 [IU] via SUBCUTANEOUS
  Administered 2017-07-14 – 2017-07-16 (×4): 4 [IU] via SUBCUTANEOUS
  Administered 2017-07-17: 3 [IU] via SUBCUTANEOUS

## 2017-07-13 MED ORDER — PROPOFOL 10 MG/ML IV BOLUS
INTRAVENOUS | Status: AC
  Filled 2017-07-13: qty 20

## 2017-07-13 MED ORDER — KETOROLAC TROMETHAMINE 30 MG/ML IJ SOLN
30.0000 mg | Freq: Once | INTRAMUSCULAR | Status: DC | PRN
  Administered 2017-07-13: 30 mg via INTRAVENOUS

## 2017-07-13 MED ORDER — MIDAZOLAM HCL 2 MG/2ML IJ SOLN
INTRAMUSCULAR | Status: AC
  Filled 2017-07-13: qty 2

## 2017-07-13 MED ORDER — GABAPENTIN 300 MG PO CAPS
300.0000 mg | ORAL_CAPSULE | ORAL | Status: DC
  Filled 2017-07-13: qty 1

## 2017-07-13 MED ORDER — SUGAMMADEX SODIUM 200 MG/2ML IV SOLN
INTRAVENOUS | Status: DC | PRN
  Administered 2017-07-13: 200 mg via INTRAVENOUS

## 2017-07-13 MED ORDER — FEBUXOSTAT 40 MG PO TABS
40.0000 mg | ORAL_TABLET | Freq: Every day | ORAL | Status: DC
  Administered 2017-07-13 – 2017-07-17 (×5): 40 mg via ORAL
  Filled 2017-07-13 (×5): qty 1

## 2017-07-13 MED ORDER — METOCLOPRAMIDE HCL 5 MG/ML IJ SOLN: 10.0000 mg | Freq: Once | INTRAMUSCULAR | Status: DC | PRN

## 2017-07-13 MED ORDER — TRAMADOL HCL 50 MG PO TABS
100.0000 mg | ORAL_TABLET | Freq: Two times a day (BID) | ORAL | Status: DC
  Administered 2017-07-13 – 2017-07-17 (×9): 100 mg via ORAL
  Filled 2017-07-13 (×9): qty 2

## 2017-07-13 MED ORDER — FENTANYL CITRATE (PF) 250 MCG/5ML IJ SOLN
INTRAMUSCULAR | Status: AC
  Filled 2017-07-13: qty 5

## 2017-07-13 MED ORDER — PHENYLEPHRINE HCL 10 MG/ML IJ SOLN
INTRAVENOUS | Status: DC | PRN
  Administered 2017-07-13: 10 ug/min via INTRAVENOUS

## 2017-07-13 MED ORDER — LORATADINE 10 MG PO TABS
10.0000 mg | ORAL_TABLET | Freq: Every day | ORAL | Status: DC
  Administered 2017-07-14 – 2017-07-17 (×4): 10 mg via ORAL
  Filled 2017-07-13 (×4): qty 1

## 2017-07-13 MED ORDER — ONDANSETRON HCL 4 MG/2ML IJ SOLN
4.0000 mg | Freq: Four times a day (QID) | INTRAMUSCULAR | Status: DC | PRN
  Administered 2017-07-13 – 2017-07-14 (×3): 4 mg via INTRAVENOUS
  Filled 2017-07-13 (×3): qty 2

## 2017-07-13 MED ORDER — ONDANSETRON HCL 4 MG/2ML IJ SOLN
INTRAMUSCULAR | Status: DC | PRN
  Administered 2017-07-13: 4 mg via INTRAVENOUS

## 2017-07-13 MED ORDER — POVIDONE-IODINE 10 % EX OINT
TOPICAL_OINTMENT | CUTANEOUS | Status: AC
  Filled 2017-07-13: qty 28.35

## 2017-07-13 MED ORDER — ALVIMOPAN 12 MG PO CAPS
12.0000 mg | ORAL_CAPSULE | ORAL | Status: AC
  Administered 2017-07-13: 12 mg via ORAL
  Filled 2017-07-13: qty 1

## 2017-07-13 MED ORDER — POVIDONE-IODINE 10 % EX OINT
TOPICAL_OINTMENT | CUTANEOUS | Status: DC | PRN
  Administered 2017-07-13: 1 via TOPICAL

## 2017-07-13 MED ORDER — HYDROMORPHONE HCL 1 MG/ML IJ SOLN
INTRAMUSCULAR | Status: AC
  Filled 2017-07-13: qty 1

## 2017-07-13 MED ORDER — CHLORHEXIDINE GLUCONATE CLOTH 2 % EX PADS: 6.0000 | MEDICATED_PAD | Freq: Once | CUTANEOUS | Status: DC

## 2017-07-13 MED ORDER — MIDAZOLAM HCL 5 MG/5ML IJ SOLN
INTRAMUSCULAR | Status: DC | PRN
  Administered 2017-07-13: 1 mg via INTRAVENOUS

## 2017-07-13 MED ORDER — LIDOCAINE IN D5W 4-5 MG/ML-% IV SOLN
INTRAVENOUS | Status: DC | PRN
  Administered 2017-07-13: 1.5 mg/min via INTRAVENOUS

## 2017-07-13 MED ORDER — TEMAZEPAM 15 MG PO CAPS: 15.0000 mg | ORAL_CAPSULE | Freq: Every evening | ORAL | Status: DC | PRN

## 2017-07-13 MED ORDER — IRBESARTAN 75 MG PO TABS
75.0000 mg | ORAL_TABLET | Freq: Every day | ORAL | Status: DC
  Administered 2017-07-13 – 2017-07-17 (×5): 75 mg via ORAL
  Filled 2017-07-13 (×5): qty 1

## 2017-07-13 MED ORDER — DEXMEDETOMIDINE HCL IN NACL 200 MCG/50ML IV SOLN
INTRAVENOUS | Status: DC | PRN
  Administered 2017-07-13: 12 ug via INTRAVENOUS

## 2017-07-13 MED ORDER — FERROUS SULFATE 325 (65 FE) MG PO TABS
325.0000 mg | ORAL_TABLET | Freq: Every day | ORAL | Status: DC
  Administered 2017-07-13 – 2017-07-17 (×4): 325 mg via ORAL
  Filled 2017-07-13 (×6): qty 1

## 2017-07-13 MED ORDER — GABAPENTIN 100 MG PO CAPS: 100.0000 mg | ORAL_CAPSULE | ORAL | Status: DC

## 2017-07-13 MED ORDER — ACETAMINOPHEN 500 MG PO TABS
1000.0000 mg | ORAL_TABLET | Freq: Three times a day (TID) | ORAL | Status: DC
  Administered 2017-07-13 – 2017-07-17 (×12): 1000 mg via ORAL
  Filled 2017-07-13 (×12): qty 2

## 2017-07-13 MED ORDER — CHLORHEXIDINE GLUCONATE CLOTH 2 % EX PADS
6.0000 | MEDICATED_PAD | Freq: Every day | CUTANEOUS | Status: AC
  Administered 2017-07-13: 6 via TOPICAL

## 2017-07-13 MED ORDER — CARVEDILOL 12.5 MG PO TABS
12.5000 mg | ORAL_TABLET | Freq: Two times a day (BID) | ORAL | Status: DC
  Administered 2017-07-13 – 2017-07-17 (×8): 12.5 mg via ORAL
  Filled 2017-07-13 (×9): qty 1

## 2017-07-13 MED ORDER — ALBUMIN HUMAN 5 % IV SOLN
INTRAVENOUS | Status: DC | PRN
  Administered 2017-07-13: 10:00:00 via INTRAVENOUS

## 2017-07-13 SURGICAL SUPPLY — 59 items
BLADE CLIPPER SURG (BLADE) IMPLANT
CANISTER SUCT 3000ML PPV (MISCELLANEOUS) ×3 IMPLANT
CHLORAPREP W/TINT 26ML (MISCELLANEOUS) ×3 IMPLANT
COVER MAYO STAND STRL (DRAPES) ×3 IMPLANT
COVER SURGICAL LIGHT HANDLE (MISCELLANEOUS) ×3 IMPLANT
DRAIN PENROSE 1/4X12 LTX STRL (WOUND CARE) ×3 IMPLANT
DRAPE HALF SHEET 40X57 (DRAPES) ×3 IMPLANT
DRAPE LAPAROSCOPIC ABDOMINAL (DRAPES) ×3 IMPLANT
DRAPE UTILITY XL STRL (DRAPES) ×3 IMPLANT
DRAPE WARM FLUID 44X44 (DRAPE) ×3 IMPLANT
DRSG OPSITE POSTOP 3X4 (GAUZE/BANDAGES/DRESSINGS) ×6 IMPLANT
DRSG OPSITE POSTOP 4X10 (GAUZE/BANDAGES/DRESSINGS) ×3 IMPLANT
DRSG OPSITE POSTOP 4X8 (GAUZE/BANDAGES/DRESSINGS) IMPLANT
ELECT BLADE 6.5 EXT (BLADE) ×3 IMPLANT
ELECT CAUTERY BLADE 6.4 (BLADE) ×6 IMPLANT
ELECT REM PT RETURN 9FT ADLT (ELECTROSURGICAL) ×3
ELECTRODE REM PT RTRN 9FT ADLT (ELECTROSURGICAL) ×1 IMPLANT
GLOVE BIOGEL PI IND STRL 6.5 (GLOVE) ×1 IMPLANT
GLOVE BIOGEL PI IND STRL 7.0 (GLOVE) ×2 IMPLANT
GLOVE BIOGEL PI IND STRL 8 (GLOVE) ×5 IMPLANT
GLOVE BIOGEL PI INDICATOR 6.5 (GLOVE) ×2
GLOVE BIOGEL PI INDICATOR 7.0 (GLOVE) ×4
GLOVE BIOGEL PI INDICATOR 8 (GLOVE) ×10
GLOVE ECLIPSE 7.5 STRL STRAW (GLOVE) ×12 IMPLANT
GLOVE ECLIPSE 8.0 STRL XLNG CF (GLOVE) ×3 IMPLANT
GLOVE INDICATOR 6.5 STRL GRN (GLOVE) ×3 IMPLANT
GOWN STRL REUS W/ TWL LRG LVL3 (GOWN DISPOSABLE) ×7 IMPLANT
GOWN STRL REUS W/TWL LRG LVL3 (GOWN DISPOSABLE) ×14
KIT BASIN OR (CUSTOM PROCEDURE TRAY) ×3 IMPLANT
KIT ROOM TURNOVER OR (KITS) ×3 IMPLANT
LEGGING LITHOTOMY PAIR STRL (DRAPES) ×3 IMPLANT
LIGASURE IMPACT 36 18CM CVD LR (INSTRUMENTS) IMPLANT
NS IRRIG 1000ML POUR BTL (IV SOLUTION) ×6 IMPLANT
PACK GENERAL/GYN (CUSTOM PROCEDURE TRAY) ×3 IMPLANT
PAD ARMBOARD 7.5X6 YLW CONV (MISCELLANEOUS) ×3 IMPLANT
PENCIL BUTTON HOLSTER BLD 10FT (ELECTRODE) ×3 IMPLANT
RELOAD PROXIMATE TA60MM BLUE (ENDOMECHANICALS) ×6 IMPLANT
SPECIMEN JAR MEDIUM (MISCELLANEOUS) ×3 IMPLANT
SPONGE LAP 18X18 X RAY DECT (DISPOSABLE) ×6 IMPLANT
STAPLER CIRC CVD 29MM 37CM (STAPLE) ×3 IMPLANT
STAPLER GUN LINEAR PROX 60 (STAPLE) ×3 IMPLANT
STAPLER VISISTAT 35W (STAPLE) ×3 IMPLANT
SUCTION POOLE TIP (SUCTIONS) ×3 IMPLANT
SURGILUBE 2OZ TUBE FLIPTOP (MISCELLANEOUS) IMPLANT
SUT NOVA NAB DX-16 0-1 5-0 T12 (SUTURE) ×9 IMPLANT
SUT PDS AB 1 TP1 96 (SUTURE) ×6 IMPLANT
SUT PROLENE 2 0 CT2 30 (SUTURE) ×3 IMPLANT
SUT SILK 2 0 SH (SUTURE) ×3 IMPLANT
SUT SILK 2 0 SH CR/8 (SUTURE) ×6 IMPLANT
SUT SILK 2 0 TIES 10X30 (SUTURE) ×3 IMPLANT
SUT SILK 3 0 SH CR/8 (SUTURE) ×3 IMPLANT
SUT SILK 3 0 TIES 10X30 (SUTURE) ×3 IMPLANT
SYR BULB IRRIGATION 50ML (SYRINGE) ×3 IMPLANT
TOWEL OR 17X26 10 PK STRL BLUE (TOWEL DISPOSABLE) ×6 IMPLANT
TRAY FOLEY W/METER SILVER 14FR (SET/KITS/TRAYS/PACK) ×3 IMPLANT
TRAY PROCTOSCOPIC FIBER OPTIC (SET/KITS/TRAYS/PACK) IMPLANT
TUBE CONNECTING 12'X1/4 (SUCTIONS) ×1
TUBE CONNECTING 12X1/4 (SUCTIONS) ×2 IMPLANT
YANKAUER SUCT BULB TIP NO VENT (SUCTIONS) ×3 IMPLANT

## 2017-07-13 NOTE — Anesthesia Postprocedure Evaluation (Signed)
Anesthesia Post Note  Patient: Crystal [name]  Procedure(s) Performed: COLOSTOMY REVERSAL (N/A Abdomen)     Patient location during evaluation: PACU Anesthesia Type: General Level of consciousness: sedated Pain management: pain level controlled Vital Signs Assessment: post-procedure vital signs reviewed and stable Respiratory status: spontaneous breathing Cardiovascular status: stable Postop Assessment: no apparent nausea or vomiting Anesthetic complications: no    Last Vitals:  Vitals:   07/13/17 1123 07/13/17 1138  BP: (!) 113/44 (!) 117/58  Pulse: 69 72  Resp: 13 11  Temp:    SpO2: 99% 100%    Last Pain:  Vitals:   07/13/17 1145  PainSc: Asleep   Pain Goal: Patients Stated Pain Goal: 3 (07/13/17 0635)  LLE Motor Response: Purposeful movement (07/13/17 1138) LLE Sensation: Full sensation (07/13/17 1138) RLE Motor Response: Purposeful movement (07/13/17 1138) RLE Sensation: Full sensation (07/13/17 1138)      Uel Davidow JR,[name]

## 2017-07-13 NOTE — Op Note (Signed)
OPERATIVE REPORT  DATE OF OPERATION: 07/13/2017  PATIENT:  Crystal [name]  66 y.o. female  PRE-OPERATIVE DIAGNOSIS:  Colosotmy in place  POST-OPERATIVE DIAGNOSIS:  Colosotmy in place  INDICATION(S) FOR OPERATION:  Patient had a subtotal colectomy for C.diff with a colostomy, now needs reversal for bowel continuity  FINDINGS:  Extensive adhesions, long sigmoid stump  PROCEDURE:  Procedure(s): COLOSTOMY REVERSAL INCIDENTAL APPENDECTOMY LYSIS OF ADHESIONS  SURGEON:  Surgeon(s): Judeth Horn, MD Jovita Kussmaul, MD  ASSISTANT: Marlou Starks, MD  ANESTHESIA:   general  COMPLICATIONS:  None  EBL: 100 ml  BLOOD ADMINISTERED: none  DRAINS: Penrose drain in the Colostomy site and Urinary Catheter (Foley)   SPECIMEN:  Source of Specimen:  Colostomy, Appendix  COUNTS CORRECT:  YES  PROCEDURE DETAILS: The patient was taken to the operating room and placed initially on the table in supine position.  A proper timeout was performed identifying the patient and the procedures to be performed.  After adequate general endotracheal anesthetic was administered she was placed in lithotomy.  The colostomy was on the right side and it was sewn shut using a running locking suture of 2-0 silk.  We subsequently prepped and draped in usual sterile manner but lowered her legs from the lithotomy position thinking that the anastomosis could be done without a transrectal stapling device.  A midline incision was made using a #10 blade and taken down into the subcutaneous tissue.  She had extensive adhesions of omentum to the anterior abdominal wall these had to be taken down carefully using electrocautery and Metzenbaum scissors.  There were no inadvertent enterotomies made.  After getting into the peritoneal cavity around the omentum we went to a free space on the left side where we could explore for the distal sigmoid and rectal stump.  We were able to localize this which was identified by Prolene stitch and  also had a large ball of inspissated stool in the distal stomach.  We took down extensive adhesions between the small bowel and the colon and subsequently took down the colostomy from the anterior abdominal wall using electrocautery to detach it from the skin and subcutaneous tissue and allow it to come back into the peritoneal cavity.  We then had to mobilize the right colon as the anastomosis required that we bring the distal transverse colon down to the pelvis on the right side.  In this orientation it did not seem to twist the mesentery or cause tension.  It was noted that the patient had a long appendix which after this anastomosis would be abnormally placed and therefore we decided to perform an incidental appendectomy, across the base of the appendix using a TX 60 stapler and the mesoappendix using Kelly clamps and 2-0 silk ties.  We were able to mobilize the right colon up past the hepatic flexure and then bring the colon down towards the right lower quadrant and into the pelvis.  The decision was made to do an antegrade the EEA anastomosis between the distal stump and the proximal colon.  We opened up the distal stump of the colon and removed the large piece of stool that was at the proximal end.  We ran a running 2-0 Prolene suture around it for the pursestring and then subsequently measured it to accept a 29 mm anastomotic device/anvil using the dilators.  The distal end of the colostomy, which had been detached, was taken off using a TX 60 stapler.  We then made a proximal colotomy in the  proximal colon using electrocautery, passed the stapling device through the colotomy down to the staple line, and and then passed the trocar through the distal end.  We attached the trocar  to the anvil that was secured in place by the pursestring suture in the distal colon and perform the anastomosis.  Anastomotic integrity was tested using saline air test filling the pelvis was with saline and the assistant  passed the sigmoidoscope up to the rectum and insufflated gas provide tension.  There was no evidence of air leak of the anastomosis.  The surgeon and assistant was subsequently changed gowns and gloves.  We irrigated with saline prior to doing so.  Once we completed changed Gowns and gloves and came back and irrigated slightly warm and closed the colostomy site using interrupted #1 figure-of-eight stitch the Novafil suture.  We then closed the fascia using running looped #1 PDS suture with interrupted figure-of-eight stitch of #1 Novafil as an internal retention suture every fourth loop.  The skin at both sites were closed using stainless steel staples with a Penrose drain in the colostomy site for drainage.  A sterile dressing was applied.  All needle counts, sponge counts, and instrument counts were correct.  PATIENT DISPOSITION:  PACU - hemodynamically stable.   Judeth Horn 12/20/201810:20 AM

## 2017-07-13 NOTE — H&P (Signed)
  Crystal [name] 05/19/2017 4:18 PM Location: [facility] Patient #: [id number] DOB: Jan 18, 1951 Widowed / Language: [name] / Race: Black or African American Female   The patient is a 66 year old female.  Allergies (Tanisha A. Owens Shark, [place]; 05/19/2017 4:19 PM) Erythromycin *MACROLIDES*  Statins  Sulfa Antibiotics  NSAIDs  Allergies Reconciled   Medication History (Tanisha A. Owens Shark, [place]; 05/19/2017 4:19 PM) Valsartan (160MG  Tablet, Oral) Active. Pantoprazole Sodium (40MG  Tablet DR, Oral) Active. Omega-3-acid Ethyl Esters (1GM Capsule, Oral) Active. Temazepam (15MG  Capsule, Oral) Active. Promethazine-Codeine (6.25-10MG /5ML Syrup, Oral) Active. Azithromycin (500MG  Tablet, Oral) Active. Carvedilol (6.25MG  Tablet, Oral) Active. Colchicine (0.6MG  Tablet, Oral) Active. Levothyroxine Sodium (88MCG Tablet, Oral) Active. Montelukast Sodium (10MG  Tablet, Oral) Active. Gabapentin (100MG  Capsule, Oral) Active. Ondansetron (4MG  Tablet Disint, Oral) Active. Medications Reconciled  Vitals (Tanisha A. Brown RMA; 05/19/2017 4:19 PM) 05/19/2017 4:18 PM Weight: 204.4 lb Height: 66in Body Surface Area: 2.02 m Body Mass Index: 32.99 kg/m  Temp.: 50F  Pulse: 100 (Regular)  BP: 136/78 (Sitting, Left Arm, Standard)  Afebrile, P 88, BP 145/50 Has been doing well. Took her bowel prep Got Entereg this AM     Physical Exam Jeneen Rinks O. Corion Sherrod MD; 05/19/2017 4:57 PM) Abdomen Note: Patient doing well and midline wound is healed. Will schedule for surgery. The watersoluble study showed that her right sided colostomy is on the other side of the abdomen and may be a stretch to get to the proximal sigmoid colon. Right sided colostomy in place.  Large and long midline wound from the xiphoid to th e pubis.  Probably will not need th efull incision for the reversal. Lungs.:  Clear Cor: RRR, no murmurs gallops or heaves.  Has been clear by cards for  surgery.    Assessment & Plan Jeneen Rinks O. Narada Uzzle MD; 05/19/2017 5:00 PM) COLOSTOMY IN PLACE (Z93.3) Story: Her surgery was for severe colitis likely secondary to C.diff. Colon was teken from the proximal transverse colon to the sigmoid colon. Impression: Plan reversal in the near future. We need specific terms about cardiac clearance. She did see Nephrologist.  Plan for reversal today.  Will keep the patient in the prone position, but may need lithotomy  Kathryne Eriksson. [name], MD, [place] [phone] [facility]

## 2017-07-13 NOTE — Anesthesia Procedure Notes (Signed)
Procedure Name: Intubation Date/Time: 07/13/2017 7:42 AM Performed by: Imagene Riches, CRNA Pre-anesthesia Checklist: Patient identified, Emergency Drugs available, Suction available and Patient being monitored Patient Re-evaluated:Patient Re-evaluated prior to induction Oxygen Delivery Method: Circle System Utilized Preoxygenation: Pre-oxygenation with 100% oxygen Induction Type: IV induction Ventilation: Mask ventilation without difficulty and Oral airway inserted - appropriate to patient size Laryngoscope Size: Sabra Heck and 2 Grade View: Grade I Tube type: Oral Tube size: 7.5 mm Number of attempts: 1 Airway Equipment and Method: Stylet and Oral airway Placement Confirmation: ETT inserted through vocal cords under direct vision,  positive ETCO2 and breath sounds checked- equal and bilateral Secured at: 23 cm Tube secured with: Tape Dental Injury: Teeth and Oropharynx as per pre-operative assessment

## 2017-07-13 NOTE — Progress Notes (Signed)
Inpatient Diabetes Program Recommendations  AACE/ADA: New Consensus Statement on Inpatient Glycemic Control (2015)  Target Ranges:  Prepandial:   less than 140 mg/dL      Peak postprandial:   less than 180 mg/dL (1-2 hours)      Critically ill patients:  140 - 180 mg/dL   Lab Results  Component Value Date   GLUCAP 283 (H) 07/13/2017   HGBA1C 8.4 (H) 07/05/2017    Review of Glycemic Control  Diabetes history: Type 2 diabetes Outpatient Diabetes medications: Toujeo 100 units every am, Apidra 20 units TID Current orders for Inpatient glycemic control: none  Inpatient Diabetes Program Recommendations:  Noted that patient takes Toujeo and Apidra at home. Recommend starting patient on 1/2 of home dose following surgery:  Lantus 50 units daily and Novolog MODERATE correction scale every 4 hours while NPO and then TID & HS when eating.  Titrate dosage as needed. Check blood sugars every 4 hours while NPO and then AC & HS when eating.   Will continue to monitor blood sugars while in the hospital.  Harvel Ricks RN BSN CDE Diabetes Coordinator Pager: [phone]  8am-5pm

## 2017-07-13 NOTE — Anesthesia Preprocedure Evaluation (Addendum)
Anesthesia Evaluation  Patient identified by MRN, date of birth, ID band Patient awake    Reviewed: Allergy & Precautions, NPO status , Patient's Chart, lab work & pertinent test results, reviewed documented beta blocker date and time   History of Anesthesia Complications (+) PONV  Airway Mallampati: I       Dental  (+) Edentulous Upper, Edentulous Lower   Pulmonary    Pulmonary exam normal breath sounds clear to auscultation       Cardiovascular hypertension, Pt. on home beta blockers Normal cardiovascular exam Rhythm:Regular Rate:Normal     Neuro/Psych PSYCHIATRIC DISORDERS Depression    GI/Hepatic GERD  ,  Endo/Other  diabetes, Poorly Controlled, Insulin DependentHypothyroidism   Renal/GU      Musculoskeletal   Abdominal (+) + obese,   Peds  Hematology   Anesthesia Other Findings Crystal [name]  ECHO COMPLETE WO IMAGING ENHANCING AGENT  Order# [id number]  Reading physician: Arnoldo Lenis, MD Ordering physician: Arnoldo Lenis, MD Study date: 03/31/16 Result Notes for ECHOCARDIOGRAM COMPLETE   Notes Recorded by Laurine Blazer, LPN on 03/20/785 at 7:54 AM EDT Patient notified and verbalized understanding. Copy to pmd. Follow up scheduled for 04/25/2016 with Dr. Harl Bowie. ------  Notes Recorded by Laurine Blazer, LPN on 4/92/0100 at 7:12 AM EDT Left message to return call.  ------  Notes Recorded by Arnoldo Lenis, MD on 04/01/2016 at 4:40 PM EDT Echo overall looks good, heart function overall looks good   Study Result   Result status: Final result                             *[facility]*                   [address]                           [place], [place] [address]                            [phone]  ------------------------------------------------------------------- Transthoracic Echocardiography  Patient:    Crystal [name], Crystal [name] MR #:       [id number] Study Date:  03/31/2016 Gender:     F Age:        19 Height:     167.6 cm Weight:     96.2 kg BSA:        2.15 m^2 Pt. Status: Room:   SONOGRAPHER  [facility]  ATTENDING    Kerry Hough, M.D.  Berna Spare, M.D.  REFERRING    Kerry Hough, M.D.  PERFORMING   [facility]  cc:  ------------------------------------------------------------------- LV EF: 45% -   50%  ------------------------------------------------------------------- History:   PMH:   Generalized edema and dyspnea.  Acute myocardial infarction.  Congestive heart failure. NICM LBBB.  Risk factors: Hypertension. Diabetes mellitus. Morbidly obese. Dyslipidemia.  ------------------------------------------------------------------- Study Conclusions  - Left ventricle: The cavity size was normal. Wall thickness was   increased increased in a pattern of mild to moderate LVH.   Systolic function was mildly reduced. The estimated ejection   fraction was in the range of 45% to 50%. Doppler parameters are   consistent with abnormal left ventricular relaxation (grade 1   diastolic dysfunction). - Aortic valve: Mildly calcified annulus. Trileaflet; mildly   thickened  leaflets. Valve area (VTI): 3.39 cm^2. Valve area   (Vmax): 3.26 cm^2. Valve area (Vmean): 3.23 cm^2. - Systemic veins: IVC is small, suggestion low RA pressure and   hypovolemia. - Technically difficult study.     Reproductive/Obstetrics                           Anesthesia Physical Anesthesia Plan  ASA: III  Anesthesia Plan: General   Post-op Pain Management:    Induction: Intravenous  PONV Risk Score and Plan: 4 or greater and Ondansetron, Midazolam, Treatment may vary due to age or medical condition and Scopolamine patch - Pre-op  Airway Management Planned: Oral ETT  Additional Equipment:   Intra-op Plan:   Post-operative Plan: Extubation in OR  Informed Consent: I have reviewed the patients  History and Physical, chart, labs and discussed the procedure including the risks, benefits and alternatives for the proposed anesthesia with the patient or authorized representative who has indicated his/her understanding and acceptance.   Dental advisory given  Plan Discussed with: CRNA and Surgeon  Anesthesia Plan Comments:        Anesthesia Quick Evaluation

## 2017-07-13 NOTE — Transfer of Care (Signed)
Immediate Anesthesia Transfer of Care Note  Patient: Crystal [name]  Procedure(s) Performed: COLOSTOMY REVERSAL (N/A Abdomen)  Patient Location: PACU  Anesthesia Type:General  Level of Consciousness: awake, alert  and oriented  Airway & Oxygen Therapy: Patient Spontanous Breathing and Patient connected to face mask oxygen  Post-op Assessment: Report given to RN and Post -op Vital signs reviewed and stable  Post vital signs: Reviewed and stable  Last Vitals:  Vitals:   07/13/17 0545  BP: (!) 145/50  Pulse: 88  Resp: 20  Temp: 36.9 C  SpO2: 97%    Last Pain: There were no vitals filed for this visit.    Patients Stated Pain Goal: 3 (18/48/59 2763)  Complications: No apparent anesthesia complications

## 2017-07-14 ENCOUNTER — Ambulatory Visit: Payer: Medicare Other | Admitting: Physical Medicine & Rehabilitation

## 2017-07-14 ENCOUNTER — Encounter (HOSPITAL_COMMUNITY): Payer: Self-pay | Admitting: General Surgery

## 2017-07-14 LAB — GLUCOSE, CAPILLARY
GLUCOSE-CAPILLARY: 173 mg/dL — AB (ref 65–99)
GLUCOSE-CAPILLARY: 174 mg/dL — AB (ref 65–99)
Glucose-Capillary: 209 mg/dL — ABNORMAL HIGH (ref 65–99)
Glucose-Capillary: 298 mg/dL — ABNORMAL HIGH (ref 65–99)

## 2017-07-14 LAB — CBC
HEMATOCRIT: 33.8 % — AB (ref 36.0–46.0)
Hemoglobin: 10.8 g/dL — ABNORMAL LOW (ref 12.0–15.0)
MCH: 26.2 pg (ref 26.0–34.0)
MCHC: 32 g/dL (ref 30.0–36.0)
MCV: 82 fL (ref 78.0–100.0)
PLATELETS: 217 10*3/uL (ref 150–400)
RBC: 4.12 MIL/uL (ref 3.87–5.11)
RDW: 15.9 % — AB (ref 11.5–15.5)
WBC: 11.4 10*3/uL — ABNORMAL HIGH (ref 4.0–10.5)

## 2017-07-14 LAB — BASIC METABOLIC PANEL
Anion gap: 7 (ref 5–15)
BUN: 25 mg/dL — ABNORMAL HIGH (ref 6–20)
CALCIUM: 8.5 mg/dL — AB (ref 8.9–10.3)
CO2: 25 mmol/L (ref 22–32)
CREATININE: 1.76 mg/dL — AB (ref 0.44–1.00)
Chloride: 104 mmol/L (ref 101–111)
GFR calc Af Amer: 33 mL/min — ABNORMAL LOW (ref >60)
GFR, EST NON AFRICAN AMERICAN: 29 mL/min — AB (ref >60)
GLUCOSE: 236 mg/dL — AB (ref 65–99)
Potassium: 4.9 mmol/L (ref 3.5–5.1)
Sodium: 136 mmol/L (ref 135–145)

## 2017-07-14 MED ORDER — INSULIN GLARGINE 100 UNIT/ML ~~LOC~~ SOLN
50.0000 [IU] | Freq: Every day | SUBCUTANEOUS | Status: DC
  Administered 2017-07-14 – 2017-07-17 (×4): 50 [IU] via SUBCUTANEOUS
  Filled 2017-07-14 (×4): qty 0.5

## 2017-07-14 MED ORDER — PROMETHAZINE HCL 25 MG/ML IJ SOLN
12.5000 mg | Freq: Four times a day (QID) | INTRAMUSCULAR | Status: DC | PRN
  Administered 2017-07-14: 12.5 mg via INTRAVENOUS
  Filled 2017-07-14: qty 1

## 2017-07-14 NOTE — Progress Notes (Signed)
GS Progress Note Subjective: Patient with nausea and vomiting throughout the night.  Asleep on arrival to the room.  Minimal pain or discomfort  Objective: Vital signs in last 24 hours: Temp:  [97 F (36.1 C)-98.9 F (37.2 C)] 98.6 F (37 C) (12/21 0755) Pulse Rate:  [39-115] 84 (12/21 0700) Resp:  [11-29] 13 (12/21 0700) BP: (105-171)/(44-155) 121/56 (12/21 0700) SpO2:  [92 %-100 %] 99 % (12/21 0700)    Intake/Output from previous day: 12/20 0701 - 12/21 0700 In: 4670 [P.O.:120; I.V.:3800; IV Piggyback:250] Out: 2195 [PPJKD:3267; Emesis/NG output:200; Blood:400] Intake/Output this shift: No intake/output data recorded.  Lungs: Clear.  Oxygen saturations 100%.  Abd: Soft, mildly distended, hypoactive bowel sounds.  Extremities: No clinical signs or symptoms of DVT  Neuro: Intact  Lab Results: CBC  Recent Labs    07/13/17 1357 07/14/17 0606  WBC 11.9* 11.4*  HGB 11.7* 10.8*  HCT 36.4 33.8*  PLT 198 217   BMET Recent Labs    07/13/17 1357 07/14/17 0606  NA  --  136  K  --  4.9  CL  --  104  CO2  --  25  GLUCOSE  --  236*  BUN  --  25*  CREATININE 1.58* 1.76*  CALCIUM  --  8.5*   PT/INR No results for input(s): LABPROT, INR in the last 72 hours. ABG No results for input(s): PHART, HCO3 in the last 72 hours.  Invalid input(s): PCO2, PO2  Studies/Results: No results found.  Anti-infectives: Anti-infectives (From admission, onward)   Start     Dose/Rate Route Frequency Ordered Stop   07/13/17 1930  cefoTEtan (CEFOTAN) 2 g in dextrose 5 % 50 mL IVPB     2 g 100 mL/hr over 30 Minutes Intravenous Every 12 hours 07/13/17 1317 07/14/17 0031   07/13/17 0614  cefoTEtan in Dextrose 5% (CEFOTAN) IVPB 2 g     2 g Intravenous On call to O.R. 07/13/17 1245 07/13/17 0744      Assessment/Plan: s/p Procedure(s): COLOSTOMY REVERSAL Will not advance her diet yet, but will not restrict it either.  Decrease IVFs  LOS: 1 day    Kathryne Eriksson. [name], MD,  FACS [phone] [facility] 07/14/2017

## 2017-07-14 NOTE — Progress Notes (Deleted)
RT had CPAP all set up and ready. Pt stated she had been nauseous and didn't think she could stand the mask on.  PT stated she was going to bring her nasal mask from home to use on 12/22. Rt will continue to monitor.

## 2017-07-14 NOTE — Plan of Care (Signed)
  Progressing Education: Knowledge of General Education information will improve 07/14/2017 1729 - Progressing by Dorita Fray, RN Health Behavior/Discharge Planning: Ability to manage health-related needs will improve 07/14/2017 1729 - Progressing by Dorita Fray, RN Clinical Measurements: Will remain free from infection 07/14/2017 1729 - Progressing by Dorita Fray, RN Respiratory complications will improve 07/14/2017 1729 - Progressing by Dorita Fray, RN Cardiovascular complication will be avoided 07/14/2017 1729 - Progressing by Dorita Fray, RN Activity: Risk for activity intolerance will decrease 07/14/2017 1729 - Progressing by Dorita Fray, RN Nutrition: Adequate nutrition will be maintained 07/14/2017 1729 - Progressing by Dorita Fray, RN Elimination: Will not experience complications related to bowel motility 07/14/2017 1729 - Progressing by Dorita Fray, RN Will not experience complications related to urinary retention 07/14/2017 1729 - Progressing by Dorita Fray, RN Pain Managment: General experience of comfort will improve 07/14/2017 1729 - Progressing by Dorita Fray, RN Safety: Ability to remain free from injury will improve 07/14/2017 1729 - Progressing by Dorita Fray, RN Skin Integrity: Risk for impaired skin integrity will decrease 07/14/2017 1729 - Progressing by Dorita Fray, RN   Patient has done well and has not had a lot of pain. Patient has been able to void since foley catheter was removed. Bowel sounds still hypoactive. Nausea has improved with no vomiting on my shift.

## 2017-07-14 NOTE — Evaluation (Signed)
Physical Therapy Evaluation Patient Details Name: Crystal [name] MRN: [id number] DOB: August 06, 1950 Today's Date: 07/14/2017   History of Present Illness  66 y.o. female admitted on 07-13-17 for colostomy reversal on day of admission.  Pt with significant PMH of syncope, obesity, OSA on CPAP, MI, LBBB, ischemic cardiomyopathy, HTN, gout, heart valve disorder, DM with neuropathy, DJD, CKD, CHF, bradycardia, chronic low back pain, and origninal ostomy in July of 2018.    Clinical Impression  Pt was able to walk without an assistive device down the hallway and back.  She reports being sore, but nausea has stopped.  She is mildly unsteady on her feet likely due to post op status and this was her first walk outside of the room.  She will likely progress well enough to not need PT after her hospital stay.   PT to follow acutely for deficits listed below.       Follow Up Recommendations No PT follow up    Equipment Recommendations  None recommended by PT    Recommendations for Other Services   NA     Precautions / Restrictions Precautions Precautions: Other (comment) Precaution Comments: abdominal      Mobility  Bed Mobility               General bed mobility comments: pt was OOB in chair  Transfers Overall transfer level: Needs assistance Equipment used: None Transfers: Sit to/from Stand Sit to Stand: Supervision         General transfer comment: supervision for safety due to slow guarded transitions  Ambulation/Gait Ambulation/Gait assistance: Supervision Ambulation Distance (Feet): 150 Feet Assistive device: None Gait Pattern/deviations: Step-through pattern;Staggering left;Staggering right Gait velocity: decreased Gait velocity interpretation: Below normal speed for age/gender General Gait Details: midly staggering gait pattern, at times reaching for support from furniture.           Balance Overall balance assessment: Needs assistance Sitting-balance support:  Feet supported;No upper extremity supported Sitting balance-Leahy Scale: Good     Standing balance support: No upper extremity supported Standing balance-Leahy Scale: Good                               Pertinent Vitals/Pain Pain Assessment: Faces Faces Pain Scale: Hurts little more Pain Location: abdomen Pain Descriptors / Indicators: Sore Pain Intervention(s): Limited activity within patient's tolerance;Monitored during session;Repositioned    Home Living Family/patient expects to be discharged to:: Private residence Living Arrangements: Children Available Help at Discharge: Family;Available 24 hours/day Type of Home: House Home Access: Level entry     Home Layout: Bed/bath upstairs;Full bath on main level;Two level Home Equipment: None Additional Comments: information about daughter's home to which is she is d/c to    Prior Function Level of Independence: Independent         Comments: very active and independent     Hand Dominance   Dominant Hand: Right    Extremity/Trunk Assessment   Upper Extremity Assessment Upper Extremity Assessment: Overall WFL for tasks assessed    Lower Extremity Assessment Lower Extremity Assessment: Generalized weakness    Cervical / Trunk Assessment Cervical / Trunk Assessment: Other exceptions Cervical / Trunk Exceptions: ho chronic low back pain  Communication   Communication: No difficulties  Cognition Arousal/Alertness: Awake/alert Behavior During Therapy: WFL for tasks assessed/performed Overall Cognitive Status: Within Functional Limits for tasks assessed  Assessment/Plan    PT Assessment Patient needs continued PT services  PT Problem List Decreased strength;Decreased activity tolerance;Decreased balance;Decreased mobility;Decreased knowledge of use of DME;Decreased knowledge of precautions;Pain;Obesity       PT Treatment Interventions  DME instruction;Gait training;Stair training;Functional mobility training;Therapeutic activities;Therapeutic exercise;Neuromuscular re-education;Balance training;Patient/family education    PT Goals (Current goals can be found in the Care Plan section)  Acute Rehab PT Goals Patient Stated Goal: to go home soon PT Goal Formulation: With patient Time For Goal Achievement: 07/14/17 Potential to Achieve Goals: Good    Frequency Min 3X/week    AM-PAC PT "6 Clicks" Daily Activity  Outcome Measure Difficulty turning over in bed (including adjusting bedclothes, sheets and blankets)?: A Little Difficulty moving from lying on back to sitting on the side of the bed? : A Little Difficulty sitting down on and standing up from a chair with arms (e.g., wheelchair, bedside commode, etc,.)?: A Little Help needed moving to and from a bed to chair (including a wheelchair)?: None Help needed walking in hospital room?: None Help needed climbing 3-5 steps with a railing? : A Little 6 Click Score: 20    End of Session   Activity Tolerance: Patient limited by fatigue;Patient limited by pain Patient left: in chair;with call bell/phone within reach;with family/visitor present   PT Visit Diagnosis: Unsteadiness on feet (R26.81);Muscle weakness (generalized) (M62.81)    Time: 6195-0932 PT Time Calculation (min) (ACUTE ONLY): 11 min   Charges:         Wells Guiles B. Auburn Hert, PT, DPT [phone]   PT Evaluation $PT Eval Moderate Complexity: 1 Mod     07/14/2017, 11:14 AM

## 2017-07-15 LAB — GLUCOSE, CAPILLARY
GLUCOSE-CAPILLARY: 112 mg/dL — AB (ref 65–99)
GLUCOSE-CAPILLARY: 179 mg/dL — AB (ref 65–99)
Glucose-Capillary: 171 mg/dL — ABNORMAL HIGH (ref 65–99)
Glucose-Capillary: 99 mg/dL (ref 65–99)

## 2017-07-15 LAB — BASIC METABOLIC PANEL
ANION GAP: 5 (ref 5–15)
BUN: 17 mg/dL (ref 6–20)
CALCIUM: 8.3 mg/dL — AB (ref 8.9–10.3)
CO2: 25 mmol/L (ref 22–32)
Chloride: 108 mmol/L (ref 101–111)
Creatinine, Ser: 1.52 mg/dL — ABNORMAL HIGH (ref 0.44–1.00)
GFR, EST AFRICAN AMERICAN: 40 mL/min — AB (ref >60)
GFR, EST NON AFRICAN AMERICAN: 34 mL/min — AB (ref >60)
GLUCOSE: 122 mg/dL — AB (ref 65–99)
POTASSIUM: 4.7 mmol/L (ref 3.5–5.1)
Sodium: 138 mmol/L (ref 135–145)

## 2017-07-15 LAB — CBC
HCT: 30.3 % — ABNORMAL LOW (ref 36.0–46.0)
Hemoglobin: 9.9 g/dL — ABNORMAL LOW (ref 12.0–15.0)
MCH: 27.3 pg (ref 26.0–34.0)
MCHC: 32.7 g/dL (ref 30.0–36.0)
MCV: 83.7 fL (ref 78.0–100.0)
PLATELETS: 182 10*3/uL (ref 150–400)
RBC: 3.62 MIL/uL — AB (ref 3.87–5.11)
RDW: 16.3 % — ABNORMAL HIGH (ref 11.5–15.5)
WBC: 10.6 10*3/uL — AB (ref 4.0–10.5)

## 2017-07-15 NOTE — Progress Notes (Signed)
2 Days Post-Op   Subjective/Chief Complaint: Doing well with CLD, no n/v mobilizing   Objective: Vital signs in last 24 hours: Temp:  [98.3 F (36.8 C)-99.7 F (37.6 C)] 99 F (37.2 C) (12/22 0757) Pulse Rate:  [77-98] 85 (12/22 0800) Resp:  [15-22] 16 (12/22 0800) BP: (101-147)/(45-72) 129/60 (12/22 0800) SpO2:  [92 %-97 %] 93 % (12/22 0800) Last BM Date: 07/13/17  Intake/Output from previous day: 12/21 0701 - 12/22 0700 In: 2313.3 [P.O.:600; I.V.:1713.3] Out: -  Intake/Output this shift: No intake/output data recorded.  General appearance: alert and cooperative GI: soft, non-tender; bowel sounds normal; no masses,  no organomegaly and incision c/d/i  Lab Results:  Recent Labs    07/14/17 0606 07/15/17 0514  WBC 11.4* 10.6*  HGB 10.8* 9.9*  HCT 33.8* 30.3*  PLT 217 182   BMET Recent Labs    07/14/17 0606 07/15/17 0514  NA 136 138  K 4.9 4.7  CL 104 108  CO2 25 25  GLUCOSE 236* 122*  BUN 25* 17  CREATININE 1.76* 1.52*  CALCIUM 8.5* 8.3*   PT/INR No results for input(s): LABPROT, INR in the last 72 hours. ABG No results for input(s): PHART, HCO3 in the last 72 hours.  Invalid input(s): PCO2, PO2  Studies/Results: No results found.  Anti-infectives: Anti-infectives (From admission, onward)   Start     Dose/Rate Route Frequency Ordered Stop   07/13/17 1930  cefoTEtan (CEFOTAN) 2 g in dextrose 5 % 50 mL IVPB     2 g 100 mL/hr over 30 Minutes Intravenous Every 12 hours 07/13/17 1317 07/14/17 0031   07/13/17 0614  cefoTEtan in Dextrose 5% (CEFOTAN) IVPB 2 g     2 g Intravenous On call to O.R. 07/13/17 0614 07/13/17 0744      Assessment/Plan: s/p Procedure(s): COLOSTOMY REVERSAL (N/A) Advance diet to FLD Mobilize as tol Await bowel fxn  LOS: 2 days    Rosario Jacks., [facility] 07/15/2017

## 2017-07-16 LAB — GLUCOSE, CAPILLARY
GLUCOSE-CAPILLARY: 94 mg/dL (ref 65–99)
GLUCOSE-CAPILLARY: 113 mg/dL — AB (ref 65–99)
GLUCOSE-CAPILLARY: 139 mg/dL — AB (ref 65–99)
Glucose-Capillary: 152 mg/dL — ABNORMAL HIGH (ref 65–99)

## 2017-07-16 NOTE — Progress Notes (Signed)
Patient placed herself on CPAP and doing well. No issues and will call if any assistance needed

## 2017-07-16 NOTE — Progress Notes (Signed)
3 Days Post-Op  Subjective: Abdominal cramping, no flatus  Objective: Vital signs in last 24 hours: Temp:  [97.5 F (36.4 C)-99.4 F (37.4 C)] 97.5 F (36.4 C) (12/23 0312) Pulse Rate:  [77-90] 77 (12/23 0908) Resp:  [16-25] 16 (12/23 0800) BP: (116-149)/(48-80) 141/54 (12/23 0908) SpO2:  [90 %-98 %] 95 % (12/23 0800) Last BM Date: 07/13/17  Intake/Output from previous day: 12/22 0701 - 12/23 0700 In: 1000 [P.O.:1000] Out: -  Intake/Output this shift: Total I/O In: 2100 [I.V.:2100] Out: -   General appearance: alert and cooperative Resp: clear to auscultation bilaterally Cardio: regular rate and rhythm GI: soft, +BS, dressings CDI  Lab Results: CBC  Recent Labs    07/14/17 0606 07/15/17 0514  WBC 11.4* 10.6*  HGB 10.8* 9.9*  HCT 33.8* 30.3*  PLT 217 182   BMET Recent Labs    07/14/17 0606 07/15/17 0514  NA 136 138  K 4.9 4.7  CL 104 108  CO2 25 25  GLUCOSE 236* 122*  BUN 25* 17  CREATININE 1.76* 1.52*  CALCIUM 8.5* 8.3*   PT/INR No results for input(s): LABPROT, INR in the last 72 hours. ABG No results for input(s): PHART, HCO3 in the last 72 hours.  Invalid input(s): PCO2, PO2  Studies/Results: No results found.  Anti-infectives: Anti-infectives (From admission, onward)   Start     Dose/Rate Route Frequency Ordered Stop   07/13/17 1930  cefoTEtan (CEFOTAN) 2 g in dextrose 5 % 50 mL IVPB     2 g 100 mL/hr over 30 Minutes Intravenous Every 12 hours 07/13/17 1317 07/14/17 0031   07/13/17 0614  cefoTEtan in Dextrose 5% (CEFOTAN) IVPB 2 g     2 g Intravenous On call to O.R. 07/13/17 0614 07/13/17 0744      Assessment/Plan: S/P colostomy takedown - fulls, await return of bowel function  LOS: 3 days    Georganna Skeans, MD, MPH, FACS Trauma: [phone] General Surgery: [phone]  12/23/2018Patient ID: Crystal [name], female   DOB: [birth year], 66 y.o.   MRN: [id number]

## 2017-07-17 LAB — GLUCOSE, CAPILLARY
GLUCOSE-CAPILLARY: 77 mg/dL (ref 65–99)
Glucose-Capillary: 137 mg/dL — ABNORMAL HIGH (ref 65–99)

## 2017-07-17 MED ORDER — TRAMADOL HCL 50 MG PO TABS: 100.0000 mg | ORAL_TABLET | Freq: Two times a day (BID) | ORAL | 5 refills | Status: DC

## 2017-07-17 MED ORDER — INSULIN GLARGINE 300 UNIT/ML ~~LOC~~ SOPN: 50.0000 [IU] | PEN_INJECTOR | Freq: Two times a day (BID) | SUBCUTANEOUS | Status: DC

## 2017-07-17 NOTE — Progress Notes (Signed)
Physical Therapy Treatment-Discharge Patient Details Name: Crystal [name] MRN: [id number] DOB: [birth year] Today's Date: 07/17/2017    History of Present Illness 66 y.o. female admitted on 07-13-17 for colostomy reversal on day of admission.  Pt with significant PMH of syncope, obesity, OSA on CPAP, MI, LBBB, ischemic cardiomyopathy, HTN, gout, heart valve disorder, DM with neuropathy, DJD, CKD, CHF, bradycardia, chronic low back pain, and origninal ostomy in July of 2018.      PT Comments    Pt is more steady and confident on her feet today.  She was able to demonstrate safety on the stairs simulating home access.  We reviewed seated exercises and incentive spirometry use.  She has met all PT goals and is due to dc from the hospital today.  PT to sign off.   Follow Up Recommendations  No PT follow up     Equipment Recommendations  None recommended by PT    Recommendations for Other Services   NA     Precautions / Restrictions Precautions Precautions: Other (comment) Precaution Comments: abdomina, reviewed log roll and pillow for coughing.     Mobility  Bed Mobility               General bed mobility comments: Pt was OOB in the chair.  Verbally reviewed log roll and reverse log roll to get into and out of bed.  Pt reported being familiar with this from previous surgery and that this is how she has been getting up here in the hospital.   Transfers Overall transfer level: Modified independent Equipment used: None Transfers: Sit to/from Stand Sit to Stand: Modified independent (Device/Increase time)         General transfer comment: Pt stood easily from the recliner chair with light use of bil hands.   Ambulation/Gait Ambulation/Gait assistance: Modified independent (Device/Increase time) Ambulation Distance (Feet): 200 Feet Assistive device: None Gait Pattern/deviations: WFL(Within Functional Limits)   Gait velocity interpretation: at or above normal speed for  age/gender     Stairs Stairs: Yes   Stair Management: One rail Left;Alternating pattern;Forwards Number of Stairs: 4(limited by IV line) General stair comments: Pt was able to do stairs safely and easily with light use of the rail for balance         Balance Overall balance assessment: No apparent balance deficits (not formally assessed)                                          Cognition Arousal/Alertness: Awake/alert Behavior During Therapy: WFL for tasks assessed/performed Overall Cognitive Status: Within Functional Limits for tasks assessed                                        Exercises General Exercises - Lower Extremity Long Arc Quad: AROM;Both;10 reps Toe Raises: AROM;Both;20 reps Heel Raises: AROM;Both;20 reps Other Exercises Other Exercises: IS x 5 reps with 1000 mL max inspired volume, cues for slower speed and to let the measuring piece go all the way back down before starting again.         Pertinent Vitals/Pain Pain Assessment: Faces Faces Pain Scale: Hurts a little bit Pain Location: abdomen Pain Descriptors / Indicators: Sore Pain Intervention(s): Limited activity within patient's tolerance;Monitored during session;Repositioned  PT Goals (current goals can now be found in the care plan section) Acute Rehab PT Goals Patient Stated Goal: to go home today PT Goal Formulation: All assessment and education complete, DC therapy Progress towards PT goals: Goals met/education completed, patient discharged from PT    Frequency    Min 3X/week      PT Plan Current plan remains appropriate;Other (comment)(dc from PT)       AM-PAC PT "6 Clicks" Daily Activity  Outcome Measure  Difficulty turning over in bed (including adjusting bedclothes, sheets and blankets)?: A Little Difficulty moving from lying on back to sitting on the side of the bed? : A Little Difficulty sitting down on and standing up from a  chair with arms (e.g., wheelchair, bedside commode, etc,.)?: None Help needed moving to and from a bed to chair (including a wheelchair)?: None Help needed walking in hospital room?: None Help needed climbing 3-5 steps with a railing? : None 6 Click Score: 22    End of Session   Activity Tolerance: Patient tolerated treatment well Patient left: in chair;with call bell/phone within reach;with family/visitor present   PT Visit Diagnosis: Unsteadiness on feet (R26.81);Muscle weakness (generalized) (M62.81)     Time: 4665-9935 PT Time Calculation (min) (ACUTE ONLY): 13 min  Charges:  $Gait Training: 8-22 mins          Myrtice Lowdermilk B. Analiya Porco, [place], DPT [phone]            07/17/2017, 9:59 AM

## 2017-07-17 NOTE — Care Management Note (Signed)
Case Management Note  Patient Details  Name: Crystal [name] MRN: [id number] Date of Birth: May 10, 1951  Subjective/Objective:    66 y.o. female admitted on 07-13-17 for colostomy reversal on day of admission.  PTA, pt independent, lives at home with children, who are able to provide 24h care at dc.                  Action/Plan: PT recommending no OP follow up.  No discharge needs identified.    Expected Discharge Date:  07/17/17               Expected Discharge Plan:  Home/Self Care  In-House Referral:     Discharge planning Services  CM Consult  Post Acute Care Choice:    Choice offered to:     DME Arranged:    DME Agency:     HH Arranged:    HH Agency:     Status of Service:  Completed, signed off  If discussed at [employer] of Stay Meetings, dates discussed:    Additional Comments:  Ella Bodo, RN 07/17/2017, 1:56 PM

## 2017-07-17 NOTE — Progress Notes (Signed)
Patient discharge instructions explained, reviewed and verbally confirmed by patient and patient son. Patient states she has no questions at this time. Patient abdominal dressings clean, dry, and intact. Patients PIV removed with no difficulties.

## 2017-07-17 NOTE — Discharge Summary (Signed)
Physician Discharge Summary  Patient ID: Crystal [name] MRN: [id number] DOB/AGE: [birth year] 67 y.o.  Admit date: 07/13/2017 Discharge date: 07/17/2017  Admission Diagnoses:  Discharge Diagnoses:  Active Problems:   Colostomy in place [facility])   Discharged Condition: good  Hospital Course: 66 yo female presented for colostomy reversal. Post op she was admitted to surgical progressive unit. Diet was slowly advanced. Her pain was well controlled. She had return of bowel function POD 4 and was advaned to carb modified diet and discharged home  Consults: None  Significant Diagnostic Studies:  CBC    Component Value Date/Time   WBC 10.6 (H) 07/15/2017 0514   RBC 3.62 (L) 07/15/2017 0514   HGB 9.9 (L) 07/15/2017 0514   HCT 30.3 (L) 07/15/2017 0514   PLT 182 07/15/2017 0514   MCV 83.7 07/15/2017 0514   MCH 27.3 07/15/2017 0514   MCHC 32.7 07/15/2017 0514   RDW 16.3 (H) 07/15/2017 0514   LYMPHSABS 2.4 07/05/2017 1045   MONOABS 0.6 07/05/2017 1045   EOSABS 0.4 07/05/2017 1045   BASOSABS 0.0 07/05/2017 1045     Treatments: surgery: colostomy reversal  Discharge Exam: Blood pressure (!) 157/78, pulse 77, temperature 97.9 F (36.6 C), temperature source Oral, resp. rate 14, height 5\' 5"  (1.651 m), weight 95.3 kg (210 lb), SpO2 100 %. General appearance: alert and cooperative Head: Normocephalic, without obvious abnormality, atraumatic Resp: clear to auscultation bilaterally Cardio: regular rate and rhythm, S1, S2 normal, no murmur, click, rub or gallop GI: soft, non-tender; bowel sounds normal; no masses,  no organomegaly and incision c/d/i, no erythema  Disposition: 06-Home-Health Care Svc  Discharge Instructions    Call MD for:  persistant nausea and vomiting   Complete by:  As directed    Call MD for:  redness, tenderness, or signs of infection (pain, swelling, redness, odor or green/yellow discharge around incision site)   Complete by:  As directed    Call MD for:  severe  uncontrolled pain   Complete by:  As directed    Call MD for:  temperature >100.4   Complete by:  As directed    Diet - low sodium heart healthy   Complete by:  As directed    Discharge wound care:   Complete by:  As directed    Remove dressings latera today, ok to shower, dry dressing over right side wound, maintain drain   Increase activity slowly   Complete by:  As directed      Allergies as of 07/17/2017      Reactions   Glucophage [metformin Hcl] Other (See Comments)   Renal failure   Ace Inhibitors Swelling, Other (See Comments)   Angioedema   Advicor [niacin-lovastatin Er] Other (See Comments)   Muscle aches   Bystolic [nebivolol Hcl] Other (See Comments)   Edema   Erythromycin Diarrhea, Nausea And Vomiting, Other (See Comments)   *DERIVATIVES*   Lipitor [atorvastatin] Other (See Comments)   Muscle aches   Lopid [gemfibrozil] Other (See Comments)   Muscle aches   Statins Other (See Comments)   Muscle aches   Adhesive [tape] Rash      Medication List    STOP taking these medications   pantoprazole 40 MG tablet Commonly known as:  PROTONIX     TAKE these medications   acetaminophen 500 MG tablet Commonly known as:  TYLENOL Take 1,000 mg by mouth every 6 (six) hours as needed for moderate pain or headache.   carvedilol 12.5 MG tablet Commonly known  as:  COREG Take 12.5 mg by mouth 2 (two) times daily with a meal.   CRESTOR 10 MG tablet Generic drug:  rosuvastatin Take 1 tablet (10 mg total) by mouth at bedtime.   escitalopram 10 MG tablet Commonly known as:  LEXAPRO Take 10 mg by mouth at bedtime.   ferrous sulfate 325 (65 FE) MG EC tablet Take 325 mg by mouth daily.   gabapentin 100 MG capsule Commonly known as:  NEURONTIN Take 100-800 mg by mouth See admin instructions. Take 100 mg by mouth in the morning and take 800 mg by mouth at bedtime   Insulin Glargine 300 UNIT/ML Sopn Commonly known as:  TOUJEO SOLOSTAR Inject 50 Units into the skin 2  (two) times daily. What changed:  how much to take   Insulin Glulisine 100 UNIT/ML Solostar Pen Commonly known as:  APIDRA Inject 20 Units into the skin 3 (three) times daily.   levothyroxine 88 MCG tablet Commonly known as:  SYNTHROID, LEVOTHROID Take 88 mcg by mouth daily.   loratadine 10 MG tablet Commonly known as:  CLARITIN Take 10 mg by mouth daily.   montelukast 10 MG tablet Commonly known as:  SINGULAIR Take 10 mg by mouth at bedtime.   nystatin powder Commonly known as:  MYCOSTATIN/NYSTOP Apply 1 g topically 3 (three) times daily as needed (to folds and armpit area).   omega-3 acid ethyl esters 1 g capsule Commonly known as:  LOVAZA Take 4 g by mouth daily.   telmisartan 20 MG tablet Commonly known as:  MICARDIS TAKE 1 TABLET BY MOUTH DAILY What changed:    how much to take  how to take this  when to take this   temazepam 15 MG capsule Commonly known as:  RESTORIL Take 1 capsule (15 mg total) by mouth at bedtime as needed for sleep.   traMADol 50 MG tablet Commonly known as:  ULTRAM Take 2 tablets (100 mg total) by mouth 2 (two) times daily.   ULORIC 40 MG tablet Generic drug:  febuxostat Take 40 mg by mouth daily.            Discharge Care Instructions  (From admission, onward)        Start     Ordered   07/17/17 0000  Discharge wound care:    Comments:  Remove dressings latera today, ok to shower, dry dressing over right side wound, maintain drain   07/17/17 0955       Signed: Arta Bruce Caysen Whang 07/17/2017, 9:55 AM

## 2017-07-19 ENCOUNTER — Telehealth: Payer: Self-pay

## 2017-07-19 NOTE — Telephone Encounter (Signed)
This is Dr Saralyn Pilar patient I would route to her for clarification

## 2017-07-19 NOTE — Telephone Encounter (Signed)
Transition Care Management Follow-Up Telephone Call   Date discharged and where: [facility] 07/17/2017  How have you been since you were released from the hospital? Little bit of pain, but it's no too bad.  Any patient concerns? None  Items Reviewed:   Meds: Y, pt needs refill of Restoril and is wondering if she needs to continue taking Micardis because discharge paperwork did not check it off for her to take but it's still on her mediation list.  Allergies: Y  Dietary Changes Reviewed: Y  Functional Questionnaire:  Independent-I Dependent-D  ADLs:   Dressing- I    Eating- I   Maintaining continence- I. Bowls are slugglish.   Transferring- I   Transportation- D   Meal Prep- I   Managing Meds- I  Confirmed importance and Date/Time of follow-up visits scheduled: Yes, pt will follow up with surgeon   Confirmed with patient if condition worsens to call PCP or go to the Emergency Dept. Patient was given office number and encouraged to call back with questions or concerns: Yes

## 2017-07-20 ENCOUNTER — Other Ambulatory Visit: Payer: Self-pay | Admitting: Internal Medicine

## 2017-07-20 MED ORDER — TEMAZEPAM 15 MG PO CAPS: 15.0000 mg | ORAL_CAPSULE | Freq: Every evening | ORAL | 0 refills | Status: DC | PRN

## 2017-07-20 NOTE — Telephone Encounter (Signed)
Ok to RF restoril as requested. Please verify pharmacy

## 2017-07-20 NOTE — Telephone Encounter (Signed)
rx called into pharmacy

## 2017-07-20 NOTE — Addendum Note (Signed)
Addended by: Tyson Dense E on: 07/20/2017 01:02 PM   Modules accepted: Orders

## 2017-07-31 ENCOUNTER — Ambulatory Visit: Payer: Medicare Other | Admitting: Physical Medicine & Rehabilitation

## 2017-08-10 ENCOUNTER — Emergency Department (HOSPITAL_COMMUNITY): Payer: Medicare Other

## 2017-08-10 ENCOUNTER — Inpatient Hospital Stay (HOSPITAL_COMMUNITY)
Admission: EM | Admit: 2017-08-10 | Discharge: 2017-08-13 | DRG: 392 | Disposition: A | Discharge status: Home / Self Care | Payer: Medicare Other | Attending: Internal Medicine | Admitting: Internal Medicine

## 2017-08-10 ENCOUNTER — Ambulatory Visit (INDEPENDENT_AMBULATORY_CARE_PROVIDER_SITE_OTHER)
Admission: EM | Admit: 2017-08-10 | Discharge: 2017-08-10 | Disposition: A | Discharge status: Nursing Home (Includes ICF) | Payer: Medicare Other | Source: Home / Self Care | Attending: Internal Medicine | Admitting: Internal Medicine

## 2017-08-10 ENCOUNTER — Encounter (HOSPITAL_COMMUNITY): Payer: Self-pay

## 2017-08-10 ENCOUNTER — Encounter (HOSPITAL_COMMUNITY): Payer: Self-pay | Admitting: Emergency Medicine

## 2017-08-10 DIAGNOSIS — R112 Nausea with vomiting, unspecified: Secondary | ICD-10-CM | POA: Diagnosis not present

## 2017-08-10 DIAGNOSIS — A419 Sepsis, unspecified organism: Secondary | ICD-10-CM | POA: Diagnosis not present

## 2017-08-10 DIAGNOSIS — E119 Type 2 diabetes mellitus without complications: Secondary | ICD-10-CM

## 2017-08-10 DIAGNOSIS — M1990 Unspecified osteoarthritis, unspecified site: Secondary | ICD-10-CM | POA: Diagnosis present

## 2017-08-10 DIAGNOSIS — Z91048 Other nonmedicinal substance allergy status: Secondary | ICD-10-CM

## 2017-08-10 DIAGNOSIS — F329 Major depressive disorder, single episode, unspecified: Secondary | ICD-10-CM | POA: Diagnosis present

## 2017-08-10 DIAGNOSIS — Z933 Colostomy status: Secondary | ICD-10-CM

## 2017-08-10 DIAGNOSIS — I2510 Atherosclerotic heart disease of native coronary artery without angina pectoris: Secondary | ICD-10-CM | POA: Diagnosis present

## 2017-08-10 DIAGNOSIS — E861 Hypovolemia: Secondary | ICD-10-CM | POA: Diagnosis present

## 2017-08-10 DIAGNOSIS — Z833 Family history of diabetes mellitus: Secondary | ICD-10-CM | POA: Diagnosis not present

## 2017-08-10 DIAGNOSIS — Z9841 Cataract extraction status, right eye: Secondary | ICD-10-CM

## 2017-08-10 DIAGNOSIS — E860 Dehydration: Secondary | ICD-10-CM

## 2017-08-10 DIAGNOSIS — I252 Old myocardial infarction: Secondary | ICD-10-CM | POA: Diagnosis not present

## 2017-08-10 DIAGNOSIS — G4733 Obstructive sleep apnea (adult) (pediatric): Secondary | ICD-10-CM | POA: Diagnosis present

## 2017-08-10 DIAGNOSIS — R531 Weakness: Secondary | ICD-10-CM | POA: Diagnosis not present

## 2017-08-10 DIAGNOSIS — G8929 Other chronic pain: Secondary | ICD-10-CM | POA: Diagnosis present

## 2017-08-10 DIAGNOSIS — E1142 Type 2 diabetes mellitus with diabetic polyneuropathy: Secondary | ICD-10-CM | POA: Diagnosis present

## 2017-08-10 DIAGNOSIS — Z95810 Presence of automatic (implantable) cardiac defibrillator: Secondary | ICD-10-CM

## 2017-08-10 DIAGNOSIS — R197 Diarrhea, unspecified: Secondary | ICD-10-CM

## 2017-08-10 DIAGNOSIS — E785 Hyperlipidemia, unspecified: Secondary | ICD-10-CM | POA: Diagnosis present

## 2017-08-10 DIAGNOSIS — N179 Acute kidney failure, unspecified: Secondary | ICD-10-CM

## 2017-08-10 DIAGNOSIS — E1122 Type 2 diabetes mellitus with diabetic chronic kidney disease: Secondary | ICD-10-CM | POA: Diagnosis present

## 2017-08-10 DIAGNOSIS — K529 Noninfective gastroenteritis and colitis, unspecified: Secondary | ICD-10-CM | POA: Diagnosis not present

## 2017-08-10 DIAGNOSIS — K219 Gastro-esophageal reflux disease without esophagitis: Secondary | ICD-10-CM | POA: Diagnosis present

## 2017-08-10 DIAGNOSIS — M109 Gout, unspecified: Secondary | ICD-10-CM | POA: Diagnosis present

## 2017-08-10 DIAGNOSIS — Z961 Presence of intraocular lens: Secondary | ICD-10-CM | POA: Diagnosis present

## 2017-08-10 DIAGNOSIS — E039 Hypothyroidism, unspecified: Secondary | ICD-10-CM | POA: Diagnosis present

## 2017-08-10 DIAGNOSIS — N183 Chronic kidney disease, stage 3 unspecified: Secondary | ICD-10-CM

## 2017-08-10 DIAGNOSIS — I509 Heart failure, unspecified: Secondary | ICD-10-CM | POA: Diagnosis not present

## 2017-08-10 DIAGNOSIS — I959 Hypotension, unspecified: Secondary | ICD-10-CM

## 2017-08-10 DIAGNOSIS — Z9981 Dependence on supplemental oxygen: Secondary | ICD-10-CM

## 2017-08-10 DIAGNOSIS — Z79890 Hormone replacement therapy (postmenopausal): Secondary | ICD-10-CM

## 2017-08-10 DIAGNOSIS — I10 Essential (primary) hypertension: Secondary | ICD-10-CM | POA: Diagnosis present

## 2017-08-10 DIAGNOSIS — F419 Anxiety disorder, unspecified: Secondary | ICD-10-CM | POA: Diagnosis present

## 2017-08-10 DIAGNOSIS — I5042 Chronic combined systolic (congestive) and diastolic (congestive) heart failure: Secondary | ICD-10-CM | POA: Diagnosis present

## 2017-08-10 DIAGNOSIS — Z8249 Family history of ischemic heart disease and other diseases of the circulatory system: Secondary | ICD-10-CM

## 2017-08-10 DIAGNOSIS — Z888 Allergy status to other drugs, medicaments and biological substances status: Secondary | ICD-10-CM

## 2017-08-10 DIAGNOSIS — Z794 Long term (current) use of insulin: Secondary | ICD-10-CM

## 2017-08-10 DIAGNOSIS — I5022 Chronic systolic (congestive) heart failure: Secondary | ICD-10-CM | POA: Diagnosis present

## 2017-08-10 DIAGNOSIS — Z79899 Other long term (current) drug therapy: Secondary | ICD-10-CM

## 2017-08-10 DIAGNOSIS — I130 Hypertensive heart and chronic kidney disease with heart failure and stage 1 through stage 4 chronic kidney disease, or unspecified chronic kidney disease: Secondary | ICD-10-CM | POA: Diagnosis present

## 2017-08-10 DIAGNOSIS — R42 Dizziness and giddiness: Secondary | ICD-10-CM | POA: Diagnosis not present

## 2017-08-10 DIAGNOSIS — Z9842 Cataract extraction status, left eye: Secondary | ICD-10-CM

## 2017-08-10 LAB — URINALYSIS, ROUTINE W REFLEX MICROSCOPIC
Bilirubin Urine: NEGATIVE
Glucose, UA: NEGATIVE mg/dL
Ketones, ur: NEGATIVE mg/dL
NITRITE: NEGATIVE
Protein, ur: 30 mg/dL — AB
SPECIFIC GRAVITY, URINE: 1.015 (ref 1.005–1.030)
pH: 5 (ref 5.0–8.0)

## 2017-08-10 LAB — COMPREHENSIVE METABOLIC PANEL
ALT: 12 U/L — ABNORMAL LOW (ref 14–54)
ANION GAP: 15 (ref 5–15)
AST: 17 U/L (ref 15–41)
Albumin: 3.6 g/dL (ref 3.5–5.0)
Alkaline Phosphatase: 91 U/L (ref 38–126)
BILIRUBIN TOTAL: 0.6 mg/dL (ref 0.3–1.2)
BUN: 68 mg/dL — ABNORMAL HIGH (ref 6–20)
CALCIUM: 8.8 mg/dL — AB (ref 8.9–10.3)
CO2: 22 mmol/L (ref 22–32)
Chloride: 98 mmol/L — ABNORMAL LOW (ref 101–111)
Creatinine, Ser: 5.62 mg/dL — ABNORMAL HIGH (ref 0.44–1.00)
GFR calc Af Amer: 8 mL/min — ABNORMAL LOW (ref >60)
GFR, EST NON AFRICAN AMERICAN: 7 mL/min — AB (ref >60)
Glucose, Bld: 133 mg/dL — ABNORMAL HIGH (ref 65–99)
POTASSIUM: 5.3 mmol/L — AB (ref 3.5–5.1)
Sodium: 135 mmol/L (ref 135–145)
TOTAL PROTEIN: 7.4 g/dL (ref 6.5–8.1)

## 2017-08-10 LAB — CBC WITH DIFFERENTIAL/PLATELET
BASOS ABS: 0 10*3/uL (ref 0.0–0.1)
BASOS PCT: 0 %
EOS PCT: 3 %
Eosinophils Absolute: 0.5 10*3/uL (ref 0.0–0.7)
HCT: 36.9 % (ref 36.0–46.0)
Hemoglobin: 11.7 g/dL — ABNORMAL LOW (ref 12.0–15.0)
LYMPHS PCT: 14 %
Lymphs Abs: 2 10*3/uL (ref 0.7–4.0)
MCH: 26.8 pg (ref 26.0–34.0)
MCHC: 31.7 g/dL (ref 30.0–36.0)
MCV: 84.6 fL (ref 78.0–100.0)
Monocytes Absolute: 1.3 10*3/uL — ABNORMAL HIGH (ref 0.1–1.0)
Monocytes Relative: 10 %
NEUTROS ABS: 10.3 10*3/uL — AB (ref 1.7–7.7)
Neutrophils Relative %: 73 %
PLATELETS: 309 10*3/uL (ref 150–400)
RBC: 4.36 MIL/uL (ref 3.87–5.11)
RDW: 15.7 % — ABNORMAL HIGH (ref 11.5–15.5)
WBC: 14.2 10*3/uL — AB (ref 4.0–10.5)

## 2017-08-10 LAB — I-STAT TROPONIN, ED: Troponin i, poc: 0.01 ng/mL (ref 0.00–0.08)

## 2017-08-10 LAB — POCT I-STAT, CHEM 8
BUN: 65 mg/dL — AB (ref 6–20)
CALCIUM ION: 1.11 mmol/L — AB (ref 1.15–1.40)
CREATININE: 6 mg/dL — AB (ref 0.44–1.00)
Chloride: 98 mmol/L — ABNORMAL LOW (ref 101–111)
GLUCOSE: 107 mg/dL — AB (ref 65–99)
HCT: 42 % (ref 36.0–46.0)
Hemoglobin: 14.3 g/dL (ref 12.0–15.0)
Potassium: 5 mmol/L (ref 3.5–5.1)
Sodium: 133 mmol/L — ABNORMAL LOW (ref 135–145)
TCO2: 25 mmol/L (ref 22–32)

## 2017-08-10 MED ORDER — LEVOTHYROXINE SODIUM 88 MCG PO TABS
88.0000 ug | ORAL_TABLET | Freq: Every day | ORAL | Status: DC
  Administered 2017-08-11 – 2017-08-13 (×3): 88 ug via ORAL
  Filled 2017-08-10 (×3): qty 1

## 2017-08-10 MED ORDER — SODIUM CHLORIDE 0.9 % IV BOLUS (SEPSIS)
1000.0000 mL | Freq: Once | INTRAVENOUS | Status: AC
  Administered 2017-08-10: 1000 mL via INTRAVENOUS

## 2017-08-10 MED ORDER — ENOXAPARIN SODIUM 30 MG/0.3ML ~~LOC~~ SOLN
30.0000 mg | SUBCUTANEOUS | Status: DC
  Administered 2017-08-11 – 2017-08-13 (×3): 30 mg via SUBCUTANEOUS
  Filled 2017-08-10 (×3): qty 0.3

## 2017-08-10 MED ORDER — SODIUM CHLORIDE 0.9% FLUSH
3.0000 mL | Freq: Two times a day (BID) | INTRAVENOUS | Status: DC
  Administered 2017-08-10: 3 mL via INTRAVENOUS

## 2017-08-10 MED ORDER — SODIUM CHLORIDE 0.9 % IV SOLN
INTRAVENOUS | Status: DC
  Administered 2017-08-10 – 2017-08-12 (×3): via INTRAVENOUS

## 2017-08-10 MED ORDER — PIPERACILLIN-TAZOBACTAM 3.375 G IVPB 30 MIN
3.3750 g | Freq: Once | INTRAVENOUS | Status: AC
  Administered 2017-08-10: 3.375 g via INTRAVENOUS
  Filled 2017-08-10: qty 50

## 2017-08-10 MED ORDER — VANCOMYCIN HCL 10 G IV SOLR
1750.0000 mg | Freq: Once | INTRAVENOUS | Status: AC
  Administered 2017-08-10: 1750 mg via INTRAVENOUS
  Filled 2017-08-10: qty 1750

## 2017-08-10 MED ORDER — ESCITALOPRAM OXALATE 10 MG PO TABS
10.0000 mg | ORAL_TABLET | Freq: Every day | ORAL | Status: DC
  Administered 2017-08-10 – 2017-08-12 (×3): 10 mg via ORAL
  Filled 2017-08-10 (×3): qty 1

## 2017-08-10 MED ORDER — PIPERACILLIN-TAZOBACTAM IN DEX 2-0.25 GM/50ML IV SOLN
2.2500 g | Freq: Three times a day (TID) | INTRAVENOUS | Status: DC
Start: 2017-08-11 — End: 2017-08-11
  Administered 2017-08-11: 2.25 g via INTRAVENOUS
  Filled 2017-08-10 (×2): qty 50

## 2017-08-10 MED ORDER — INSULIN GLARGINE 100 UNIT/ML ~~LOC~~ SOLN
25.0000 [IU] | Freq: Two times a day (BID) | SUBCUTANEOUS | Status: DC
  Administered 2017-08-10 – 2017-08-13 (×6): 25 [IU] via SUBCUTANEOUS
  Filled 2017-08-10 (×6): qty 0.25

## 2017-08-10 MED ORDER — ROSUVASTATIN CALCIUM 10 MG PO TABS
10.0000 mg | ORAL_TABLET | Freq: Every day | ORAL | Status: DC
  Administered 2017-08-10 – 2017-08-12 (×3): 10 mg via ORAL
  Filled 2017-08-10 (×3): qty 1

## 2017-08-10 MED ORDER — CARVEDILOL 12.5 MG PO TABS: 12.5000 mg | ORAL_TABLET | Freq: Two times a day (BID) | ORAL | Status: DC

## 2017-08-10 NOTE — ED Triage Notes (Signed)
PT reports frequent vomiting from Tuesday until Wednesday night  Diarrhea since Tuesday as well.   PT reports she is weak and lightheaded.

## 2017-08-10 NOTE — ED Provider Notes (Signed)
[place]    CSN: [id number] Arrival date & time: 08/10/17  1552     History   Chief Complaint Chief Complaint  Patient presents with  . Emesis  . Diarrhea    HPI NGOC DETJEN is a 67 y.o. female.   67 year old female with history of CAD with AICD, CHF, CKD, DM comes in for 3-day history of diarrhea, vomiting, weakness, dizziness.  Patient had colostomy reversal about a month ago, states surgery went well, and was told to expect irregularities in bowel movements post surgery.  She states she has had similar symptoms in the past the last for a few days, current symptoms worse than others.  Denies blood in the stool, blood in the vomit.  Denies chest pain, shortness of breath, syncope.  Denies orthopnea, leg swelling.  Patient states that she moved down from [place] a few years ago, and has been managed by internal medicine without need to follow-up with cardiology.  Last echo 2 years ago with a EF of 55.  Patient states blood sugar today in the 100s.      Past Medical History:  Diagnosis Date  . AICD (automatic cardioverter/defibrillator) present    high RV threshold chronically, device was turned off in 2014; Device battery has been dead x 7 years  . Anemia, iron deficiency   . Angioedema    felt to likely be due to ace inhibitors but says she has had this even off of medicines, appears to be tolerating ARBs chronically  . Anxiety   . Arthritis    "hands, legs, arms" (07/13/2017)  . Asthmatic bronchitis with status asthmaticus   . Bradycardia   . CHF (congestive heart failure) ([place])   . Chronic lower back pain   . Chronic pain   . Chronic renal insufficiency   . CKD (chronic kidney disease), stage III ([place])   . Coronary artery disease    Mild nonobstructive (30% LAD, 30% RCA) by 09/01/16 cath at [facility]  . Degenerative joint disease   . Depression   . Diabetes mellitus, type 2 ([place])   . Diabetic peripheral neuropathy ([place])   . Dizziness   .  Dyspnea on exertion   . Family history of adverse reaction to anesthesia    Mother has nausea  . GERD (gastroesophageal reflux disease)   . Gout   . Heart valve disorder   . History of blood transfusion 1981; ~ 2005; 12/2016   "childbirth; defibrillator OR; colostomy OR"  . History of mononucleosis 03/2014  . Hyperlipidemia   . Hypertension   . Hypothyroid   . Insomnia   . Intervertebral disc degeneration   . Ischemic cardiomyopathy   . LBBB (left bundle branch block)   . Myocardial infarction ([place]) dx'd ~ 2005  . Nonischemic cardiomyopathy ([place])    s/p ICD in 2005. EF has since recovered  . Obesity   . On home oxygen therapy    "2L at night" (07/13/2017)  . OSA on CPAP   . Pneumonia    "couple times; last time was 12/2016" (07/13/2017)  . PONV (postoperative nausea and vomiting)   . Swelling   . Syncope   . Systemic hypertension   . Vitamin D deficiency     Patient Active Problem List   Diagnosis Date Noted  . Colostomy in place [facility]) 07/13/2017  . Adjustment disorder with depressed mood   . Debilitated 02/06/2017  . S/P colostomy ([place]) 01/31/2017  . Pressure injury of skin 01/28/2017  .  Colitis 01/26/2017  . Acute MI ([place]) 05/05/2014  . Anemia, iron deficiency 05/05/2014  . Airway hyperreactivity 05/05/2014  . Diabetes mellitus, type 2 ([place]) 05/05/2014  . Chronic systolic heart failure ([place]) 05/05/2014  . HLD (hyperlipidemia) 05/05/2014  . BP (high blood pressure) 05/05/2014  . Disease of thyroid gland 05/05/2014  . Nonischemic cardiomyopathy ([place]) 02/02/2014  . LBBB (left bundle branch block) 02/02/2014  . Chronic systolic dysfunction of left ventricle 02/02/2014  . Chronic renal insufficiency 02/02/2014  . Essential hypertension 02/02/2014  . Morbid obesity ([place]) 02/02/2014  . History of prolonged Q-T interval on ECG 10/31/2012  . Automatic implantable cardioverter-defibrillator in situ 08/06/2008    Past Surgical History:  Procedure Laterality Date  .  APPENDECTOMY  07/13/2017  . BLADDER SUSPENSION  1990   "w/hysterectomy"  . CARDIAC CATHETERIZATION  2005   no obstructive CAD per patient  . CARDIAC CATHETERIZATION  2018  . CARDIAC DEFIBRILLATOR PLACEMENT  2005   BiV ICD implanted,  LV lead is an epicardial lead  . CARPAL TUNNEL RELEASE Left 07/25/2014   Dr.Williamson   . CATARACT EXTRACTION W/ INTRAOCULAR LENS  IMPLANT, BILATERAL Bilateral   . COLONOSCOPY     2010-2011 Dr.Kipreos   . COLOSTOMY  12/2016   Archie Endo 01/26/2017  . COLOSTOMY REVERSAL  07/13/2017  . COLOSTOMY REVERSAL N/A 07/13/2017   Procedure: COLOSTOMY REVERSAL;  Surgeon: Judeth Horn, MD;  Location: [facility];  Service: General;  Laterality: N/A;  . CYST REMOVAL HAND Right 05/2013   thumb  . DILATION AND CURETTAGE OF UTERUS  X 5-6  . IMPLANTABLE CARDIOVERTER DEFIBRILLATOR GENERATOR CHANGE  2008  . TOTAL ABDOMINAL HYSTERECTOMY  1990  . TRIGGER FINGER RELEASE Right 05/213  . TUBAL LIGATION  1980s  . VESICOVAGINAL FISTULA CLOSURE W/ TAH      OB History    No data available       Home Medications    Prior to Admission medications   Medication Sig Start Date End Date Taking? Authorizing Provider  carvedilol (COREG) 12.5 MG tablet Take 12.5 mg by mouth 2 (two) times daily with a meal.   Yes [provider]  CRESTOR 10 MG tablet Take 1 tablet (10 mg total) by mouth at bedtime. 02/15/17  Yes Love, Ivan Anchors, PA-C  Insulin Glargine (TOUJEO SOLOSTAR) 300 UNIT/ML SOPN Inject 50 Units into the skin 2 (two) times daily. 07/17/17  Yes Kinsinger, Arta Bruce, MD  traMADol (ULTRAM) 50 MG tablet Take 2 tablets (100 mg total) by mouth 2 (two) times daily. 07/17/17  Yes Kinsinger, Arta Bruce, MD  acetaminophen (TYLENOL) 500 MG tablet Take 1,000 mg by mouth every 6 (six) hours as needed for moderate pain or headache.    [provider]  escitalopram (LEXAPRO) 10 MG tablet Take 10 mg by mouth at bedtime.    [provider]  ferrous sulfate 325 (65 FE) MG EC  tablet Take 325 mg by mouth daily.     [provider]  gabapentin (NEURONTIN) 100 MG capsule Take 100-800 mg by mouth See admin instructions. Take 100 mg by mouth in the morning and take 800 mg by mouth at bedtime    [provider]  Insulin Glulisine (APIDRA) 100 UNIT/ML Solostar Pen Inject 20 Units into the skin 3 (three) times daily.    [provider]  levothyroxine (SYNTHROID, LEVOTHROID) 88 MCG tablet Take 88 mcg by mouth daily.  10/16/15   [provider]  loratadine (CLARITIN) 10 MG tablet Take 10 mg by  mouth daily.    [provider]  montelukast (SINGULAIR) 10 MG tablet Take 10 mg by mouth at bedtime.    [provider]  nystatin (MYCOSTATIN/NYSTOP) powder Apply 1 g topically 3 (three) times daily as needed (to folds and armpit area).     [provider]  omega-3 acid ethyl esters (LOVAZA) 1 g capsule Take 4 g by mouth daily.    [provider]  telmisartan (MICARDIS) 20 MG tablet TAKE 1 TABLET BY MOUTH DAILY Patient taking differently: Take 20 mg by mouth daily 06/19/17   Gildardo Cranker, DO  temazepam (RESTORIL) 15 MG capsule Take 1 capsule (15 mg total) by mouth at bedtime as needed for sleep. 07/20/17   Gildardo Cranker, DO  ULORIC 40 MG tablet Take 40 mg by mouth daily.  01/29/14   [provider]    Family History Family History  Problem Relation Age of Onset  . Hypertension Father   . Heart disease Father   . Cancer Father        prostate  . Heart disease Other        (Maternal side) Ischemic heart disease  . Diabetes Mellitus II Other   . Arrhythmia Mother   . Diabetes Mellitus II Mother        Borderline DM  . Hypertension Mother   . Asthma Mother   . Cancer Brother   . Dementia Brother   . Hypertension Brother   . Hypertension Daughter   . Hypertension Daughter   . Diabetes Daughter   . Hypertension Daughter   . Atrial fibrillation Daughter   . GER disease Daughter   . Hypertension  Son   . Anxiety disorder Son   . Hypothyroidism Son     Social History Social History   Tobacco Use  . Smoking status: Never Smoker  . Smokeless tobacco: Never Used  Substance Use Topics  . Alcohol use: No  . Drug use: No     Allergies   Glucophage [metformin hcl]; Ace inhibitors; Advicor [niacin-lovastatin er]; Bystolic [nebivolol hcl]; Erythromycin; Lipitor [atorvastatin]; Lopid [gemfibrozil]; Statins; and Adhesive [tape]   Review of Systems Review of Systems  Reason unable to perform ROS: See HPI a above.     Physical Exam Triage Vital Signs ED Triage Vitals  Enc Vitals Group     BP 08/10/17 1629 (!) 81/55     Pulse Rate 08/10/17 1628 91     Resp 08/10/17 1628 16     Temp 08/10/17 1628 98.4 F (36.9 C)     Temp Source 08/10/17 1628 Oral     SpO2 08/10/17 1628 96 %     Weight 08/10/17 1629 205 lb (93 kg)     Height --      Head Circumference --      Peak Flow --      Pain Score --      Pain Loc --      Pain Edu? --      Excl. in [place]? --    No data found.  Updated Vital Signs BP (!) 72/48 (BP Location: Left Arm)   Pulse 83   Temp 98.4 F (36.9 C) (Oral)   Resp 16   Wt 205 lb (93 kg)   SpO2 96%   BMI 34.11 kg/m   Physical Exam  Constitutional: She is oriented to person, place, and time.  Patient in moderate distress, unable to sit up from exam table due to weakness and dizziness.  Patient  is not diaphoretic.  Cardiovascular:  Patient unable to sit up for exam.  Difficult exam.  Normal rate, regular rhythm, heart sounds normal.  No obvious murmurs rubs or gallops heard.  Pulmonary/Chest:  Given patient laying down, unable to do full lung exam.  Patient breathing normally without obvious adventitious lung sounds.  Abdominal:  Bowel sounds present, abdominal soft.   Neurological: She is alert and oriented to person, place, and time.  Skin: Skin is warm and dry.     UC Treatments / Results  Labs (all labs ordered are listed, but only abnormal  results are displayed) Labs Reviewed  POCT I-STAT, CHEM 8 - Abnormal; Notable for the following components:      Result Value   Sodium 133 (*)    Chloride 98 (*)    BUN 65 (*)    Creatinine, Ser 6.00 (*)    Glucose, Bld 107 (*)    Calcium, Ion 1.11 (*)    All other components within normal limits    EKG  EKG Interpretation None       Radiology No results found.  Procedures Procedures (including critical care time)  Medications Ordered in UC Medications  sodium chloride 0.9 % bolus 1,000 mL (1,000 mLs Intravenous New Bag/Given 08/10/17 1709)     Initial Impression / Assessment and Plan / UC Course  I have reviewed the triage vital signs and the nursing notes.  Pertinent labs & imaging results that were available during my care of the patient were reviewed by me and considered in my medical decision making (see chart for details).    67 year old female with history of CAD with AICD, CHF, CKD, DM comes in for 3-day history of diarrhea, vomiting, weakness, dizziness.  Patient status post colostomy reversal about a month ago, states surgery went well and was told to expect irregular bowel movements.  She states she has similar episodes in the past that resolved on own.  Current episode has been 3 days, she has had nausea, vomiting, diarrhea without blood in stool or blood in vomit.  She denies chest pain, shortness of breath, syncope, orthopnea, leg swelling.  Patient on exam was moderate distress, unable to sit up on exam table due to weakness and dizziness.  She was found to be hypotensive at 81/55. IV fluids started, i-STAT showed a creatinine of 6.  Patient creatinine on 07/14/2017 was 1.76.  Given history and exam, patient discharged to the emergency department via [facility] for further evaluation and treatment needed.  Final Clinical Impressions(s) / UC Diagnoses   Final diagnoses:  Acute kidney injury (HCC)  Nausea vomiting and diarrhea    ED Discharge Orders    None          Arturo Morton 08/10/17 2041

## 2017-08-10 NOTE — ED Triage Notes (Signed)
PT reports she is only urinating drops of urine which is not her normal

## 2017-08-10 NOTE — ED Provider Notes (Signed)
[facility] PROGRESSIVE CARE Provider Note   CSN: [id number] Arrival date & time: 08/10/17  1740     History   Chief Complaint Chief Complaint  Patient presents with  . Hypotension  . Diarrhea    HPI Crystal [name] is a 67 y.o. female past medical history of AICD, diabetes, CAD, CKD who presents for evaluation from urgent care for hypertension, nausea/vomiting diarrhea.  Patient initially presented to the urgent care earlier today for evaluation of nausea/vomiting and diarrhea that been going on for the last 2 days.  Patient reports multiple episodes of nonbloody, nonbilious emesis.  She states that she has been able to tolerate liquids but has not had an appetite for any other foods.  Patient also reports several episodes of non-bloody diarrhea.  Her last bowel movement was this morning.  Patient states that she went to urgent care today because she was feeling generalized weakness, lightheadedness.  She is not taking any medications for his symptoms.  Patient denies any fever, dizziness, headache, chest pain, difficulty breathing, abdominal pain, dysuria, hematuria, numbness/weakness of her arms or legs.  Patient reports that she does have a recent colostomy reversal in December 2018 after she had had surgery for ischemic bowel.  She was hospitalized and discharged on 07/21/17.  The history is provided by the patient.    Past Medical History:  Diagnosis Date  . AICD (automatic cardioverter/defibrillator) present    high RV threshold chronically, device was turned off in 2014; Device battery has been dead x 7 years  . Anemia, iron deficiency   . Angioedema    felt to likely be due to ace inhibitors but says she has had this even off of medicines, appears to be tolerating ARBs chronically  . Anxiety   . Arthritis    "hands, legs, arms" (07/13/2017)  . Asthmatic bronchitis with status asthmaticus   . Bradycardia   . CHF (congestive heart failure) ([place])   . Chronic lower back  pain   . Chronic pain   . Chronic renal insufficiency   . CKD (chronic kidney disease), stage III ([place])   . Coronary artery disease    Mild nonobstructive (30% LAD, 30% RCA) by 09/01/16 cath at [facility]  . Degenerative joint disease   . Depression   . Diabetes mellitus, type 2 ([place])   . Diabetic peripheral neuropathy ([place])   . Dizziness   . Dyspnea on exertion   . Family history of adverse reaction to anesthesia    Mother has nausea  . GERD (gastroesophageal reflux disease)   . Gout   . Heart valve disorder   . History of blood transfusion 1981; ~ 2005; 12/2016   "childbirth; defibrillator OR; colostomy OR"  . History of mononucleosis 03/2014  . Hyperlipidemia   . Hypertension   . Hypothyroid   . Insomnia   . Intervertebral disc degeneration   . Ischemic cardiomyopathy   . LBBB (left bundle branch block)   . Myocardial infarction ([place]) dx'd ~ 2005  . Nonischemic cardiomyopathy ([place])    s/p ICD in 2005. EF has since recovered  . Obesity   . On home oxygen therapy    "2L at night" (07/13/2017)  . OSA on CPAP   . Pneumonia    "couple times; last time was 12/2016" (07/13/2017)  . PONV (postoperative nausea and vomiting)   . Swelling   . Syncope   . Systemic hypertension   . Vitamin D deficiency     Patient  Active Problem List   Diagnosis Date Noted  . Hypovolemia 08/10/2017  . 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 Adjustment disorder with depressed mood   . Debilitated 02/06/2017  . S/P colostomy ([place]) 01/31/2017  . Pressure injury of skin 01/28/2017  . Colitis 01/26/2017  . Acute MI ([place]) 05/05/2014  . Anemia, iron deficiency 05/05/2014  . Airway hyperreactivity 05/05/2014  . Diabetes mellitus, type 2 ([place]) 05/05/2014  . Chronic systolic heart failure ([place]) 05/05/2014  . HLD (hyperlipidemia) 05/05/2014  . BP (high blood pressure) 05/05/2014  . Disease of thyroid gland 05/05/2014  . Nonischemic cardiomyopathy ([place]) 02/02/2014  . LBBB (left bundle branch  block) 02/02/2014  . Chronic systolic dysfunction of left ventricle 02/02/2014  . Chronic renal insufficiency 02/02/2014  . Essential hypertension 02/02/2014  . Morbid obesity ([place]) 02/02/2014  . History of prolonged Q-T interval on ECG 10/31/2012  . Automatic implantable cardioverter-defibrillator in situ 08/06/2008    Past Surgical History:  Procedure Laterality Date  . APPENDECTOMY  07/13/2017  . BLADDER SUSPENSION  1990   "w/hysterectomy"  . CARDIAC CATHETERIZATION  2005   no obstructive CAD per patient  . CARDIAC CATHETERIZATION  2018  . CARDIAC DEFIBRILLATOR PLACEMENT  2005   BiV ICD implanted,  LV lead is an epicardial lead  . CARPAL TUNNEL RELEASE Left 07/25/2014   Dr.Williamson   . CATARACT EXTRACTION W/ INTRAOCULAR LENS  IMPLANT, BILATERAL Bilateral   . COLONOSCOPY     2010-2011 Dr.Kipreos   . COLOSTOMY  12/2016   Archie Endo 01/26/2017  . COLOSTOMY REVERSAL  07/13/2017  . COLOSTOMY REVERSAL N/A 07/13/2017   Procedure: COLOSTOMY REVERSAL;  Surgeon: Judeth Horn, MD;  Location: [facility];  Service: General;  Laterality: N/A;  . CYST REMOVAL HAND Right 05/2013   thumb  . DILATION AND CURETTAGE OF UTERUS  X 5-6  . IMPLANTABLE CARDIOVERTER DEFIBRILLATOR GENERATOR CHANGE  2008  . TOTAL ABDOMINAL HYSTERECTOMY  1990  . TRIGGER FINGER RELEASE Right 05/213  . TUBAL LIGATION  1980s  . VESICOVAGINAL FISTULA CLOSURE W/ TAH      OB History    No data available       Home Medications    Prior to Admission medications   Medication Sig Start Date End Date Taking? Authorizing Provider  acetaminophen (TYLENOL) 500 MG tablet Take 1,000 mg by mouth every 6 (six) hours as needed for moderate pain or headache.   Yes [provider]  carvedilol (COREG) 12.5 MG tablet Take 12.5 mg by mouth 2 (two) times daily with a meal.   Yes [provider]  CRESTOR 10 MG tablet Take 1 tablet (10 mg total) by mouth at bedtime. 02/15/17  Yes Love, Ivan Anchors, PA-C  escitalopram (LEXAPRO)  10 MG tablet Take 10 mg by mouth at bedtime.   Yes [provider]  ferrous sulfate 325 (65 FE) MG EC tablet Take 325 mg by mouth daily.    Yes [provider]  gabapentin (NEURONTIN) 100 MG capsule Take 100-800 mg by mouth See admin instructions. Take 100 mg by mouth in the morning and take 800 mg by mouth at bedtime   Yes [provider]  Insulin Glargine (TOUJEO SOLOSTAR) 300 UNIT/ML SOPN Inject 50 Units into the skin 2 (two) times daily. 07/17/17  Yes Kinsinger, Arta Bruce, MD  Insulin Glulisine (APIDRA) 100 UNIT/ML Solostar Pen Inject 20 Units into the skin 3 (three) times daily.   Yes [provider]  levothyroxine (SYNTHROID, LEVOTHROID) 88 MCG tablet Take 88  mcg by mouth daily.  10/16/15  Yes [provider]  loratadine (CLARITIN) 10 MG tablet Take 10 mg by mouth daily.   Yes [provider]  montelukast (SINGULAIR) 10 MG tablet Take 10 mg by mouth at bedtime.   Yes [provider]  omega-3 acid ethyl esters (LOVAZA) 1 g capsule Take 4 g by mouth at bedtime.    Yes [provider]  ondansetron (ZOFRAN-ODT) 4 MG disintegrating tablet Take 4 mg by mouth every 6 (six) hours as needed. 07/20/17  Yes [provider]  telmisartan (MICARDIS) 20 MG tablet TAKE 1 TABLET BY MOUTH DAILY Patient taking differently: Take 20 mg by mouth daily 06/19/17  Yes Eulas Post, Monica, DO  temazepam (RESTORIL) 15 MG capsule Take 1 capsule (15 mg total) by mouth at bedtime as needed for sleep. 07/20/17  Yes Eulas Post, Monica, DO  traMADol (ULTRAM) 50 MG tablet Take 2 tablets (100 mg total) by mouth 2 (two) times daily. Patient taking differently: Take 100 mg by mouth daily as needed.  07/17/17  Yes Kinsinger, Arta Bruce, MD  ULORIC 40 MG tablet Take 40 mg by mouth daily.  01/29/14  Yes [provider]    Family History Family History  Problem Relation Age of Onset  . Hypertension Father   . Heart disease Father   . Cancer Father          prostate  . Heart disease Other        (Maternal side) Ischemic heart disease  . Diabetes Mellitus II Other   . Arrhythmia Mother   . Diabetes Mellitus II Mother        Borderline DM  . Hypertension Mother   . Asthma Mother   . Cancer Brother   . Dementia Brother   . Hypertension Brother   . Hypertension Daughter   . Hypertension Daughter   . Diabetes Daughter   . Hypertension Daughter   . Atrial fibrillation Daughter   . GER disease Daughter   . Hypertension Son   . Anxiety disorder Son   . Hypothyroidism Son     Social History Social History   Tobacco Use  . Smoking status: Never Smoker  . Smokeless tobacco: Never Used  Substance Use Topics  . Alcohol use: No  . Drug use: No     Allergies   Glucophage [metformin hcl]; Ace inhibitors; Advicor [niacin-lovastatin er]; Bystolic [nebivolol hcl]; Erythromycin; Lipitor [atorvastatin]; Lopid [gemfibrozil]; Statins; and Adhesive [tape]   Review of Systems Review of Systems  Constitutional: Negative for fever.  Respiratory: Negative for cough and shortness of breath.   Cardiovascular: Negative for chest pain.  Gastrointestinal: Positive for diarrhea, nausea and vomiting. Negative for abdominal pain and blood in stool.  Genitourinary: Negative for dysuria and hematuria.  Neurological: Positive for weakness (generalized) and light-headedness. Negative for headaches.     Physical Exam Updated Vital Signs BP (!) 118/41 (BP Location: Left Arm)   Pulse 89   Temp 98.4 F (36.9 C) (Oral)   Resp 20   Ht 5\' 5"  (1.651 m)   Wt 91.5 kg (201 lb 11.5 oz)   SpO2 100%   BMI 33.57 kg/m   Physical Exam  Constitutional: She is oriented to person, place, and time. She appears well-developed and well-nourished.  HENT:  Head: Normocephalic and atraumatic.  Mouth/Throat: Oropharynx is clear and moist and mucous membranes are normal.  Eyes: Conjunctivae, EOM and lids are normal. Pupils are equal, round, and reactive to  light.  Neck: Full  passive range of motion without pain.  Cardiovascular: Normal rate, regular rhythm, normal heart sounds and normal pulses. Exam reveals no gallop and no friction rub.  No murmur heard. Pulses:      Radial pulses are 2+ on the right side, and 2+ on the left side.       Dorsalis pedis pulses are 2+ on the right side, and 2+ on the left side.  Pulmonary/Chest: Effort normal and breath sounds normal.  Abdominal: Soft. Normal appearance and bowel sounds are normal. She exhibits no distension. There is no tenderness. There is no rigidity, no guarding and no CVA tenderness.  Healing surgical incision scars noted to the midline that extends from the epigastric down past the umbilicus.  Healing surgical incision to the right lower quadrant.  Surgical incision sites are slightly erythematous but no surrounding warmth, induration, active drainage noted.  Musculoskeletal: Normal range of motion.  Neurological: She is alert and oriented to person, place, and time.  Cranial nerves III-XII intact Follows commands, Moves all extremities  5/5 strength to BUE and BLE  Sensation intact throughout all major nerve distributions Normal finger to nose. No dysdiadochokinesia. No pronator drift. No slurred speech. No facial droop.   Skin: Skin is warm and dry. Capillary refill takes less than 2 seconds.  Psychiatric: She has a normal mood and affect. Her speech is normal.  Nursing note and vitals reviewed.    ED Treatments / Results  Labs (all labs ordered are listed, but only abnormal results are displayed) Labs Reviewed  COMPREHENSIVE METABOLIC PANEL - Abnormal; Notable for the following components:      Result Value   Potassium 5.3 (*)    Chloride 98 (*)    Glucose, Bld 133 (*)    BUN 68 (*)    Creatinine, Ser 5.62 (*)    Calcium 8.8 (*)    ALT 12 (*)    GFR calc non Af Amer 7 (*)    GFR calc Af Amer 8 (*)    All other components within normal limits  CBC WITH  DIFFERENTIAL/PLATELET - Abnormal; Notable for the following components:   WBC 14.2 (*)    Hemoglobin 11.7 (*)    RDW 15.7 (*)    Neutro Abs 10.3 (*)    Monocytes Absolute 1.3 (*)    All other components within normal limits  URINALYSIS, ROUTINE W REFLEX MICROSCOPIC - Abnormal; Notable for the following components:   APPearance CLOUDY (*)    Hgb urine dipstick SMALL (*)    Protein, ur 30 (*)    Leukocytes, UA MODERATE (*)    Bacteria, UA FEW (*)    Squamous Epithelial / LPF 6-30 (*)    Non Squamous Epithelial 0-5 (*)    All other components within normal limits  CULTURE, BLOOD (ROUTINE X 2)  CULTURE, BLOOD (ROUTINE X 2)  GASTROINTESTINAL PANEL BY PCR, STOOL (REPLACES STOOL CULTURE)  MRSA PCR SCREENING  URINALYSIS, ROUTINE W REFLEX MICROSCOPIC  SODIUM, URINE, RANDOM  CREATININE, URINE, RANDOM  BASIC METABOLIC PANEL  CBC  I-STAT TROPONIN, ED    EKG  EKG Interpretation None       Radiology Dg Chest Port 1 View  Result Date: 08/10/2017 CLINICAL DATA:  Generalized weakness EXAM: PORTABLE CHEST 1 VIEW COMPARISON:  05/06/2017 FINDINGS: Left-sided pacing device as before. Borderline cardiomegaly. No consolidation or effusion. No pneumothorax. IMPRESSION: No active disease. Electronically Signed   By: Donavan Foil M.D.   On: 08/10/2017 18:49   Dg Abd Portable  2 Views  Result Date: 08/10/2017 CLINICAL DATA:  Vomiting and nausea EXAM: PORTABLE ABDOMEN - 2 VIEW COMPARISON:  CT abdomen pelvis 01/27/2017 FINDINGS: No free air beneath the diaphragm. Nonobstructed bowel-gas pattern. No abnormal calcifications. IMPRESSION: Nonobstructed gas pattern. Electronically Signed   By: Donavan Foil M.D.   On: 08/10/2017 18:51    Procedures Procedures (including critical care time)  Medications Ordered in ED Medications  vancomycin (VANCOCIN) 1,750 mg in sodium chloride 0.9 % 500 mL IVPB (1,750 mg Intravenous New Bag/Given 08/10/17 2232)  piperacillin-tazobactam (ZOSYN) IVPB 2.25 g (not  administered)  carvedilol (COREG) tablet 12.5 mg (not administered)  rosuvastatin (CRESTOR) tablet 10 mg (10 mg Oral Given 08/10/17 2338)  escitalopram (LEXAPRO) tablet 10 mg (10 mg Oral Given 08/10/17 2337)  insulin glargine (LANTUS) injection 25 Units (25 Units Subcutaneous Given 08/10/17 2338)  levothyroxine (SYNTHROID, LEVOTHROID) tablet 88 mcg (not administered)  enoxaparin (LOVENOX) injection 30 mg (not administered)  sodium chloride flush (NS) 0.9 % injection 3 mL (3 mLs Intravenous Given 08/10/17 2338)  0.9 %  sodium chloride infusion ( Intravenous New Bag/Given 08/10/17 2347)  sodium chloride 0.9 % bolus 1,000 mL (1,000 mLs Intravenous New Bag/Given 08/10/17 1947)  sodium chloride 0.9 % bolus 1,000 mL (1,000 mLs Intravenous New Bag/Given 08/10/17 2114)  piperacillin-tazobactam (ZOSYN) IVPB 3.375 g (0 g Intravenous Stopped 08/10/17 2154)     Initial Impression / Assessment and Plan / ED Course  I have reviewed the triage vital signs and the nursing notes.  Pertinent labs & imaging results that were available during my care of the patient were reviewed by me and considered in my medical decision making (see chart for details).     67 y.o. F with PMH/o DM, CKD, CAD who presents for evaluation of 2 days of nausea/vomiting.  No chest pain, difficulty breathing, abdominal pain, fever.  Patient went to urgent care today because she was feeling generalized weakness.  At urgent care patient was found to be hypotensive, diaphoretic.  Patient had an i-STAT Chem-8 that showed a creatinine of 6 and BUN of 65.  Patient had had a recent creatinine of 1.76 in December 2018.  Patient was recently discharged from the hospital on 07/21/17 for an ostomy reversal.  On initial ED arrival, patient is afebrile.  She is hypotensive.  But not tachycardic.  No indications for septic workup at this time.  Lungs clear to auscultation bilaterally.  Patient with no urinary complaints.  Patient overall well-appearing.  She  is not diaphoretic here.  Distal pulses intact equally.  Patient does not appear septic at this time.  Consider acute infectious etiology.  Given recent hospitalization, consider C. difficile.  Also consider ACS etiology, though atypical presentation.  Plan to recheck basic labs, EKG, and portable imaging.   Labs and imaging reviewed.  Chest x-ray negative for any acute abnormality.  Abdominal x-ray shows no evidence of free air, obstruction.  Troponin is negative.  CMP shows a creatinine of 5.62, BUN of 68.  This is increased from her baseline.  CBC with slight leukocytosis of 14.2.  UA shows moderate leukocytes, pyuria.  Suspect that acute kidney injury may be due to hypovolemia as patient has been having continued nausea/vomiting and diarrhea with decreased p.o. intake.  Given concerns for acute kidney injury, will plan for admission.  Discussed with hospitalist.  Will plan to except for admission.  Final Clinical Impressions(s) / ED Diagnoses   Final diagnoses:  Acute kidney injury ([place])  Dehydration  ED Discharge Orders    None       Desma Mcgregor 08/10/17 2350    Noemi Chapel, MD 08/11/17 [phone]

## 2017-08-10 NOTE — Progress Notes (Signed)
Pharmacy Antibiotic Note  Crystal [name] is a 67 y.o. female admitted on 08/10/2017 with sepsis.  Pharmacy has been consulted for vancomycin and Zosyn dosing. Pt is afebrile. Elevated WBC at 14.2. Hx of CKD with AKI Scr 5.62 (baseline ~1.3).  Plan: Vancomycin 1750mg  IV x1 Zosyn 3.375g IV x1 (30 minute infusion) Zosyn 2.25gm IV q8h  F/u renal trend for Vanc maintenance dose  Temp (24hrs), Avg:98.7 F (37.1 C), Min:98.4 F (36.9 C), Max:98.9 F (37.2 C)  Recent Labs  Lab 08/10/17 1645 08/10/17 1841  WBC  --  14.2*  CREATININE 6.00* 5.62*    Estimated Creatinine Clearance: 10.9 mL/min (A) (by C-G formula based on SCr of 5.62 mg/dL (H)).    Allergies  Allergen Reactions  . Glucophage [Metformin Hcl] Other (See Comments)    Renal failure  . Ace Inhibitors Swelling and Other (See Comments)    Angioedema  . Advicor [Niacin-Lovastatin Er] Other (See Comments)    Muscle aches  . Bystolic [Nebivolol Hcl] Other (See Comments)    Edema  . Erythromycin Diarrhea, Nausea And Vomiting and Other (See Comments)    *DERIVATIVES*  . Lipitor [Atorvastatin] Other (See Comments)    Muscle aches  . Lopid [Gemfibrozil] Other (See Comments)    Muscle aches  . Statins Other (See Comments)    Muscle aches  . Adhesive [Tape] Rash    Antimicrobials this admission: Vanc 1/17 >>  Zosyn 1/17 >>   Dose adjustments this admission: None  Microbiology results: Pending  Thank you for allowing pharmacy to be a part of this patient's care.  Crystal [name] 08/10/2017 8:24 PM

## 2017-08-10 NOTE — ED Notes (Signed)
Telephone report given to [facility]

## 2017-08-10 NOTE — ED Notes (Signed)
Pt on Zoll for cardiac monitoring.  Dinamap unable to read pt's current BP.  Pt is lying on the bed, diaphoretic.  She is still Alert and Oriented x4.

## 2017-08-10 NOTE — ED Notes (Signed)
NS bolus completed. IV saline locked at this time.

## 2017-08-10 NOTE — ED Notes (Signed)
Pt provided with [place] sandwich and diet ginger ale, per MD Sabra Heck

## 2017-08-10 NOTE — ED Triage Notes (Signed)
Pt arrives to ED from Urgent Care with complaints of nausea, vomiting, and diarrhea since x2 days ago. EMS reports pt went to urgent care for n/v/d and was found to be hypotensive with systolic bp in the 06U and 70s. Pt has hx of renal failure, HF, pacemaker (which does not have a working battery per pt). Pt placed in position of comfort with bed locked and lowered, call bell in reach.

## 2017-08-10 NOTE — Discharge Instructions (Signed)
Given history, exam, blood work, need further evaluation at the emergency department.

## 2017-08-10 NOTE — ED Provider Notes (Signed)
Medical screening examination/treatment/procedure(s) were conducted as a shared visit with non-physician practitioner(s) and myself.  I personally evaluated the patient during the encounter.  Clinical Impression:   Final diagnoses:  None    Pt has recently had reversal of her ostomy a month ago - now prseents with 3 days of diarrhea - she has no fevers and no pain - she has been light headed especially with standing - she has had 12-15 episodes of diarrhea per day for the alst couple of days.  Soft abdomen Weak pulses Dry MM, clear heart and lung sounds Hypotensive, no tachycardia  Acute renal failure Cr of > 6.  Labs at [facility] prior to being sent down R/o C dif - hydrate, admit   Crystal Chapel, MD 08/11/17 1454

## 2017-08-10 NOTE — H&P (Signed)
[facility] History and Physical  Crystal [name] KNL:[id number] DOB: April 09, 1951 DOA: 08/10/2017  Referring physician:  PCP: Gildardo Cranker, DO   Chief Complaint: "I just kept thowin up and I had some diarrhea too."  HPI: Crystal [name] is a 67 y.o. female with past medical history significant for asthma, heart failure, CKD, depression, diabetes presents emergency room with vomiting and weakness.  Patient had reversal of her colostomy in December.  Has had loose stools since then.  Acutely starting Tuesday had several episodes of nonbloody nonbilious vomiting and loose diarrhea and greater frequency.  Nausea and vomiting stopped Thursday.  Loose stools continued but have slowed down.  Patient also noticed that she had less urine output.  No known sick contacts.  Denies any suspicious ingestions.  No new medications.  ED course: Patient given a liter and half of fluids.  Blood pressure remained soft.  Hospitalist consulted for admission.   Review of Systems:  As per HPI otherwise 10 point review of systems negative.    Past Medical History:  Diagnosis Date  . AICD (automatic cardioverter/defibrillator) present    high RV threshold chronically, device was turned off in 2014; Device battery has been dead x 7 years  . Anemia, iron deficiency   . Angioedema    felt to likely be due to ace inhibitors but says she has had this even off of medicines, appears to be tolerating ARBs chronically  . Anxiety   . Arthritis    "hands, legs, arms" (07/13/2017)  . Asthmatic bronchitis with status asthmaticus   . Bradycardia   . CHF (congestive heart failure) ([place])   . Chronic lower back pain   . Chronic pain   . Chronic renal insufficiency   . CKD (chronic kidney disease), stage III ([place])   . Coronary artery disease    Mild nonobstructive (30% LAD, 30% RCA) by 09/01/16 cath at [facility]  . Degenerative joint disease   . Depression   . Diabetes mellitus, type 2 ([place])   . Diabetic  peripheral neuropathy ([place])   . Dizziness   . Dyspnea on exertion   . Family history of adverse reaction to anesthesia    Mother has nausea  . GERD (gastroesophageal reflux disease)   . Gout   . Heart valve disorder   . History of blood transfusion 1981; ~ 2005; 12/2016   "childbirth; defibrillator OR; colostomy OR"  . History of mononucleosis 03/2014  . Hyperlipidemia   . Hypertension   . Hypothyroid   . Insomnia   . Intervertebral disc degeneration   . Ischemic cardiomyopathy   . LBBB (left bundle branch block)   . Myocardial infarction ([place]) dx'd ~ 2005  . Nonischemic cardiomyopathy ([place])    s/p ICD in 2005. EF has since recovered  . Obesity   . On home oxygen therapy    "2L at night" (07/13/2017)  . OSA on CPAP   . Pneumonia    "couple times; last time was 12/2016" (07/13/2017)  . PONV (postoperative nausea and vomiting)   . Swelling   . Syncope   . Systemic hypertension   . Vitamin D deficiency    Past Surgical History:  Procedure Laterality Date  . APPENDECTOMY  07/13/2017  . BLADDER SUSPENSION  1990   "w/hysterectomy"  . CARDIAC CATHETERIZATION  2005   no obstructive CAD per patient  . CARDIAC CATHETERIZATION  2018  . CARDIAC DEFIBRILLATOR PLACEMENT  2005   BiV ICD implanted,  LV lead is an  epicardial lead  . CARPAL TUNNEL RELEASE Left 07/25/2014   Dr.Williamson   . CATARACT EXTRACTION W/ INTRAOCULAR LENS  IMPLANT, BILATERAL Bilateral   . COLONOSCOPY     2010-2011 Dr.Kipreos   . COLOSTOMY  12/2016   Archie Endo 01/26/2017  . COLOSTOMY REVERSAL  07/13/2017  . COLOSTOMY REVERSAL N/A 07/13/2017   Procedure: COLOSTOMY REVERSAL;  Surgeon: Judeth Horn, MD;  Location: [facility];  Service: General;  Laterality: N/A;  . CYST REMOVAL HAND Right 05/2013   thumb  . DILATION AND CURETTAGE OF UTERUS  X 5-6  . IMPLANTABLE CARDIOVERTER DEFIBRILLATOR GENERATOR CHANGE  2008  . TOTAL ABDOMINAL HYSTERECTOMY  1990  . TRIGGER FINGER RELEASE Right 05/213  . TUBAL LIGATION  1980s  .  VESICOVAGINAL FISTULA CLOSURE W/ TAH     Social History:  reports that  has never smoked. she has never used smokeless tobacco. She reports that she does not drink alcohol or use drugs.  Allergies  Allergen Reactions  . Glucophage [Metformin Hcl] Other (See Comments)    Renal failure  . Ace Inhibitors Swelling and Other (See Comments)    Angioedema  . Advicor [Niacin-Lovastatin Er] Other (See Comments)    Muscle aches  . Bystolic [Nebivolol Hcl] Other (See Comments)    Edema  . Erythromycin Diarrhea, Nausea And Vomiting and Other (See Comments)    *DERIVATIVES*  . Lipitor [Atorvastatin] Other (See Comments)    Muscle aches  . Lopid [Gemfibrozil] Other (See Comments)    Muscle aches  . Statins Other (See Comments)    Muscle aches  . Adhesive [Tape] Rash    Family History  Problem Relation Age of Onset  . Hypertension Father   . Heart disease Father   . Cancer Father        prostate  . Heart disease Other        (Maternal side) Ischemic heart disease  . Diabetes Mellitus II Other   . Arrhythmia Mother   . Diabetes Mellitus II Mother        Borderline DM  . Hypertension Mother   . Asthma Mother   . Cancer Brother   . Dementia Brother   . Hypertension Brother   . Hypertension Daughter   . Hypertension Daughter   . Diabetes Daughter   . Hypertension Daughter   . Atrial fibrillation Daughter   . GER disease Daughter   . Hypertension Son   . Anxiety disorder Son   . Hypothyroidism Son      Prior to Admission medications   Medication Sig Start Date End Date Taking? Authorizing Provider  acetaminophen (TYLENOL) 500 MG tablet Take 1,000 mg by mouth every 6 (six) hours as needed for moderate pain or headache.   Yes [provider]  carvedilol (COREG) 12.5 MG tablet Take 12.5 mg by mouth 2 (two) times daily with a meal.   Yes [provider]  CRESTOR 10 MG tablet Take 1 tablet (10 mg total) by mouth at bedtime. 02/15/17  Yes Love, Ivan Anchors, PA-C    escitalopram (LEXAPRO) 10 MG tablet Take 10 mg by mouth at bedtime.   Yes [provider]  ferrous sulfate 325 (65 FE) MG EC tablet Take 325 mg by mouth daily.    Yes [provider]  gabapentin (NEURONTIN) 100 MG capsule Take 100-800 mg by mouth See admin instructions. Take 100 mg by mouth in the morning and take 800 mg by mouth at bedtime   Yes [provider]  Insulin Glargine (  TOUJEO SOLOSTAR) 300 UNIT/ML SOPN Inject 50 Units into the skin 2 (two) times daily. 07/17/17  Yes Kinsinger, Arta Bruce, MD  Insulin Glulisine (APIDRA) 100 UNIT/ML Solostar Pen Inject 20 Units into the skin 3 (three) times daily.   Yes [provider]  levothyroxine (SYNTHROID, LEVOTHROID) 88 MCG tablet Take 88 mcg by mouth daily.  10/16/15  Yes [provider]  loratadine (CLARITIN) 10 MG tablet Take 10 mg by mouth daily.   Yes [provider]  montelukast (SINGULAIR) 10 MG tablet Take 10 mg by mouth at bedtime.   Yes [provider]  omega-3 acid ethyl esters (LOVAZA) 1 g capsule Take 4 g by mouth at bedtime.    Yes [provider]  ondansetron (ZOFRAN-ODT) 4 MG disintegrating tablet Take 4 mg by mouth every 6 (six) hours as needed. 07/20/17  Yes [provider]  telmisartan (MICARDIS) 20 MG tablet TAKE 1 TABLET BY MOUTH DAILY Patient taking differently: Take 20 mg by mouth daily 06/19/17  Yes Eulas Post, Monica, DO  temazepam (RESTORIL) 15 MG capsule Take 1 capsule (15 mg total) by mouth at bedtime as needed for sleep. 07/20/17  Yes Eulas Post, Monica, DO  traMADol (ULTRAM) 50 MG tablet Take 2 tablets (100 mg total) by mouth 2 (two) times daily. Patient taking differently: Take 100 mg by mouth daily as needed.  07/17/17  Yes Kinsinger, Arta Bruce, MD  ULORIC 40 MG tablet Take 40 mg by mouth daily.  01/29/14  Yes [provider]   Physical Exam: Vitals:   08/10/17 2115 08/10/17 2120 08/10/17 2125 08/10/17 2203  BP: (!) 127/49 (!) 108/46  (!) 102/44 (!) 118/41  Pulse: 87 82 82 89  Resp: 16 15 15 20   Temp:    98.4 F (36.9 C)  TempSrc:    Oral  SpO2: 99% 100% 100%   Weight:    91.5 kg (201 lb 11.5 oz)  Height:    5\' 5"  (1.651 m)    Wt Readings from Last 3 Encounters:  08/10/17 91.5 kg (201 lb 11.5 oz)  08/10/17 93 kg (205 lb)  07/13/17 95.3 kg (210 lb)    General:  Appears calm and comfortable; A&Ox3 Eyes:  PERRL, EOMI, normal lids, iris ENT:  grossly normal hearing, lips & tongue Neck:  no LAD, masses or thyromegaly Cardiovascular:  RRR, no m/r/g. No LE edema.  Respiratory:  CTA bilaterally, no w/r/r. Normal respiratory effort. Abdomen:  soft, ntnd Skin:  no rash or induration seen on limited exam Musculoskeletal:  grossly normal tone BUE/BLE Psychiatric:  grossly normal mood and affect, speech fluent and appropriate Neurologic:  CN 2-12 grossly intact, moves all extremities in coordinated fashion.          Labs on Admission:  Basic Metabolic Panel: Recent Labs  Lab 08/10/17 1645 08/10/17 1841  NA 133* 135  K 5.0 5.3*  CL 98* 98*  CO2  --  22  GLUCOSE 107* 133*  BUN 65* 68*  CREATININE 6.00* 5.62*  CALCIUM  --  8.8*   Liver Function Tests: Recent Labs  Lab 08/10/17 1841  AST 17  ALT 12*  ALKPHOS 91  BILITOT 0.6  PROT 7.4  ALBUMIN 3.6   No results for input(s): LIPASE, AMYLASE in the last 168 hours. No results for input(s): AMMONIA in the last 168 hours. CBC: Recent Labs  Lab 08/10/17 1645 08/10/17 1841  WBC  --  14.2*  NEUTROABS  --  10.3*  HGB 14.3 11.7*  HCT 42.0 36.9  MCV  --  84.6  PLT  --  309   Cardiac Enzymes: No results for input(s): CKTOTAL, CKMB, CKMBINDEX, TROPONINI in the last 168 hours.  BNP (last 3 results) No results for input(s): BNP in the last 8760 hours.  ProBNP (last 3 results) No results for input(s): PROBNP in the last 8760 hours.   Serum creatinine: 5.62 mg/dL (H) 08/10/17 1841 Estimated creatinine clearance: 10.9 mL/min (A)  CBG: No results  for input(s): GLUCAP in the last 168 hours.  Radiological Exams on Admission: Dg Chest Port 1 View  Result Date: 08/10/2017 CLINICAL DATA:  Generalized weakness EXAM: PORTABLE CHEST 1 VIEW COMPARISON:  05/06/2017 FINDINGS: Left-sided pacing device as before. Borderline cardiomegaly. No consolidation or effusion. No pneumothorax. IMPRESSION: No active disease. Electronically Signed   By: Donavan Foil M.D.   On: 08/10/2017 18:49   Dg Abd Portable 2 Views  Result Date: 08/10/2017 CLINICAL DATA:  Vomiting and nausea EXAM: PORTABLE ABDOMEN - 2 VIEW COMPARISON:  CT abdomen pelvis 01/27/2017 FINDINGS: No free air beneath the diaphragm. Nonobstructed bowel-gas pattern. No abnormal calcifications. IMPRESSION: Nonobstructed gas pattern. Electronically Signed   By: Donavan Foil M.D.   On: 08/10/2017 18:51    EKG: Independently reviewed. NSR. No stemi.  Assessment/Plan Active Problems:   Hypovolemia  Sepsis/Hypovolemia Patient meets SIRS criteria has possible urinary or GI source. Cultures drawn Vancomycin and Zosyn given Continue to hydrate patient Lactic acid normal This is possibly just severe hydration and antibiotics we de-escalated Cont pulse ox  AKI Baseline Cr 1.5-1.7, Cr on admit 5.62 159ml of normal saline given in the emergency room Gentle hydration overnight Checking magnesium and phosphorus Urine labs ordered to calculate fractional excretion of Na Renal US ordered Strict I&O   CHF Cont Coreg CXR in AM  Hyperlipidemia Continue statin  Depression No SI/HI Cont leaxapro  Chronic pain Hold gabapentin  DM II Glargine 50u->25u bid, incro once appetite improves SSI AC  Hypothyroidism Cont OP synthroid 88 mcg qd No signs of hyper or hypothyroidism  Allergies Hold Claritin, singulair  Hypertension When necessary hydralazine 10 mg IV as needed for severe blood pressure Hold oral BP meds  Gout Hold uloric  Code Status: FC  DVT Prophylaxis:  heparin Family Communication: none available Disposition Plan: Pending Improvement  Status: sdu iinpt  Elwin Mocha, MD Family Medicine [facility] [email or web address] Password [email or web address]

## 2017-08-10 NOTE — ED Notes (Signed)
Pt taken to ED via [facility] [place].  Pt was A&O upon departure.  Pt's belongings were taken by her SIL and grandchildren.

## 2017-08-10 NOTE — ED Notes (Signed)
PT laying down with feet up. Rondel Oh and Amy Tasia Catchings made aware of triage.

## 2017-08-11 ENCOUNTER — Inpatient Hospital Stay (HOSPITAL_COMMUNITY): Payer: Medicare Other

## 2017-08-11 ENCOUNTER — Other Ambulatory Visit: Payer: Self-pay

## 2017-08-11 DIAGNOSIS — N179 Acute kidney failure, unspecified: Secondary | ICD-10-CM

## 2017-08-11 DIAGNOSIS — K529 Noninfective gastroenteritis and colitis, unspecified: Principal | ICD-10-CM | POA: Diagnosis present

## 2017-08-11 DIAGNOSIS — N183 Chronic kidney disease, stage 3 unspecified: Secondary | ICD-10-CM

## 2017-08-11 DIAGNOSIS — I959 Hypotension, unspecified: Secondary | ICD-10-CM

## 2017-08-11 LAB — URINALYSIS, ROUTINE W REFLEX MICROSCOPIC
BACTERIA UA: NONE SEEN
Bilirubin Urine: NEGATIVE
Glucose, UA: 50 mg/dL — AB
Hgb urine dipstick: NEGATIVE
KETONES UR: NEGATIVE mg/dL
NITRITE: NEGATIVE
PH: 5 (ref 5.0–8.0)
PROTEIN: NEGATIVE mg/dL
Specific Gravity, Urine: 1.01 (ref 1.005–1.030)

## 2017-08-11 LAB — C DIFFICILE QUICK SCREEN W PCR REFLEX
C DIFFICILE (CDIFF) TOXIN: NEGATIVE
C DIFFICLE (CDIFF) ANTIGEN: NEGATIVE
C Diff interpretation: NOT DETECTED

## 2017-08-11 LAB — GLUCOSE, CAPILLARY
GLUCOSE-CAPILLARY: 127 mg/dL — AB (ref 65–99)
Glucose-Capillary: 160 mg/dL — ABNORMAL HIGH (ref 65–99)
Glucose-Capillary: 182 mg/dL — ABNORMAL HIGH (ref 65–99)

## 2017-08-11 LAB — BASIC METABOLIC PANEL
Anion gap: 12 (ref 5–15)
BUN: 67 mg/dL — AB (ref 6–20)
CALCIUM: 7.8 mg/dL — AB (ref 8.9–10.3)
CHLORIDE: 105 mmol/L (ref 101–111)
CO2: 20 mmol/L — AB (ref 22–32)
CREATININE: 5.26 mg/dL — AB (ref 0.44–1.00)
GFR calc non Af Amer: 8 mL/min — ABNORMAL LOW (ref >60)
GFR, EST AFRICAN AMERICAN: 9 mL/min — AB (ref >60)
Glucose, Bld: 136 mg/dL — ABNORMAL HIGH (ref 65–99)
Potassium: 4.5 mmol/L (ref 3.5–5.1)
SODIUM: 137 mmol/L (ref 135–145)

## 2017-08-11 LAB — GASTROINTESTINAL PANEL BY PCR, STOOL (REPLACES STOOL CULTURE)
ADENOVIRUS F40/41: NOT DETECTED
Astrovirus: NOT DETECTED
CAMPYLOBACTER SPECIES: NOT DETECTED
CRYPTOSPORIDIUM: NOT DETECTED
CYCLOSPORA CAYETANENSIS: NOT DETECTED
ENTEROPATHOGENIC E COLI (EPEC): NOT DETECTED
Entamoeba histolytica: NOT DETECTED
Enteroaggregative E coli (EAEC): NOT DETECTED
Enterotoxigenic E coli (ETEC): NOT DETECTED
Giardia lamblia: NOT DETECTED
Norovirus GI/GII: NOT DETECTED
Plesimonas shigelloides: NOT DETECTED
ROTAVIRUS A: NOT DETECTED
SALMONELLA SPECIES: NOT DETECTED
SHIGELLA/ENTEROINVASIVE E COLI (EIEC): NOT DETECTED
Sapovirus (I, II, IV, and V): NOT DETECTED
Shiga like toxin producing E coli (STEC): NOT DETECTED
VIBRIO SPECIES: NOT DETECTED
Vibrio cholerae: NOT DETECTED
YERSINIA ENTEROCOLITICA: NOT DETECTED

## 2017-08-11 LAB — CBC
HCT: 32.7 % — ABNORMAL LOW (ref 36.0–46.0)
Hemoglobin: 10.1 g/dL — ABNORMAL LOW (ref 12.0–15.0)
MCH: 26.3 pg (ref 26.0–34.0)
MCHC: 30.9 g/dL (ref 30.0–36.0)
MCV: 85.2 fL (ref 78.0–100.0)
PLATELETS: 222 10*3/uL (ref 150–400)
RBC: 3.84 MIL/uL — AB (ref 3.87–5.11)
RDW: 15.8 % — AB (ref 11.5–15.5)
WBC: 8.8 10*3/uL (ref 4.0–10.5)

## 2017-08-11 LAB — MRSA PCR SCREENING: MRSA BY PCR: NEGATIVE

## 2017-08-11 LAB — SODIUM, URINE, RANDOM: SODIUM UR: 73 mmol/L

## 2017-08-11 LAB — CREATININE, URINE, RANDOM: CREATININE, URINE: 74.51 mg/dL

## 2017-08-11 MED ORDER — INSULIN ASPART 100 UNIT/ML ~~LOC~~ SOLN
0.0000 [IU] | Freq: Three times a day (TID) | SUBCUTANEOUS | Status: DC
  Administered 2017-08-11: 1 [IU] via SUBCUTANEOUS
  Administered 2017-08-11 – 2017-08-13 (×2): 2 [IU] via SUBCUTANEOUS

## 2017-08-11 MED ORDER — ZOLPIDEM TARTRATE 5 MG PO TABS
5.0000 mg | ORAL_TABLET | Freq: Every evening | ORAL | Status: DC | PRN
  Administered 2017-08-12 (×2): 5 mg via ORAL
  Filled 2017-08-11 (×2): qty 1

## 2017-08-11 NOTE — Plan of Care (Signed)
Pt is ambulating to the bathroom with minimal assist. Will continue to monitor.

## 2017-08-11 NOTE — Progress Notes (Signed)
PROGRESS NOTE    Crystal [name]  RCV:[id number] DOB: 07-14-1951 DOA: 08/10/2017 PCP: Gildardo Cranker, DO     Brief Narrative:  Crystal [name] is a 67 y.o. female with past medical history significant for asthma, heart failure, CKD, depression, diabetes presents emergency room with vomiting and weakness.  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 Patient had reversal of her colostomy in December that acute issue.  She describes having constipation as well as diarrhea since then, which she was told would be expected.  Then starting a few days ago, she started to have nausea, vomiting, diarrhea as well as decreased urine output.  She denies any sick contacts.  She did take 3 days of antibiotics prior to her colostomy reversal in December.  Assessment & Plan:   Principal Problem:   Gastroenteritis Active Problems:   Essential hypertension   Diabetes mellitus, type 2 (HCC)   Chronic systolic heart failure (HCC)   Hypovolemia   Hypotension   AKI (acute kidney injury) (HCC)  Hypovolemia and hypotension secondary to severe dehydration due to gastroenteritis -Most likely secondary to viral etiology -Stop antibiotics and monitor -Continue IV fluids -Stool studies are negative. C Diff pending   Acute kidney injury -Baseline creatinine 1.5-1.7 -Renal ultrasound unremarkable -Improving with IV fluids  Chronic diastolic heart failure -Not in acute exacerbation, she is hypovolemic on presentation  Hyperlipidemia -Continue statin  Depression -Continue Lexapro  Diabetes mellitus type 2 -Sliding scale insulin and Lantus  Hypothyroidism -Continue Synthroid  Hypertension -Hold oral antihypertensive in setting of hypovolemia, hypotension  OSA -CPAP qhs    DVT prophylaxis: Lovenox  Code Status: Full code Family Communication: Daughter at bedside Disposition Plan: Pending improvement in  creatinine, blood pressure   Consultants:   None  Procedures:   None  Antimicrobials:  Anti-infectives (From admission, onward)   Start     Dose/Rate Route Frequency Ordered Stop   08/11/17 0500  piperacillin-tazobactam (ZOSYN) IVPB 2.25 g  Status:  Discontinued     2.25 g 100 mL/hr over 30 Minutes Intravenous Every 8 hours 08/10/17 2031 08/11/17 0902   08/10/17 2045  vancomycin (VANCOCIN) 1,750 mg in sodium chloride 0.9 % 500 mL IVPB     1,750 mg 250 mL/hr over 120 Minutes Intravenous  Once 08/10/17 2031 08/11/17 0032   08/10/17 2045  piperacillin-tazobactam (ZOSYN) IVPB 3.375 g     3.375 g 100 mL/hr over 30 Minutes Intravenous  Once 08/10/17 2031 08/10/17 2154       Subjective: Patient feeling much better this morning.  She had a full dinner without any nausea or she states that her diarrhea has also improved.  She does have some right lower quadrant abdominal pain.  Her urine output has increase as well.  Objective: Vitals:   08/11/17 0611 08/11/17 0724 08/11/17 1112 08/11/17 1200  BP:  (!) 101/51 (!) 111/50   Pulse: 93 81 82   Resp: 18 12 14    Temp:  98.9 F (37.2 C) 99.2 F (37.3 C)   TempSrc:  Oral Oral   SpO2: 96% 99% 98% 98%  Weight: 92.9 kg (204 lb 12.9 oz)     Height:        Intake/Output Summary (Last 24 hours) at 08/11/2017 1505 Last data filed at 08/11/2017 1300 Gross per 24 hour  Intake 1274.25 ml  Output 900 ml  Net 374.25 ml   Filed Weights   08/10/17 2203 08/11/17  0611  Weight: 91.5 kg (201 lb 11.5 oz) 92.9 kg (204 lb 12.9 oz)    Examination:  General exam: Appears calm and comfortable  Respiratory system: Clear to auscultation. Respiratory effort normal. Cardiovascular system: S1 & S2 heard, RRR. No JVD, murmurs, rubs, gallops or clicks. No pedal edema. Gastrointestinal system: Abdomen is nondistended, soft and nontender. No organomegaly or masses felt. Normal bowel sounds heard. Central nervous system: Alert and oriented. No focal  neurological deficits. Extremities: Symmetric 5 x 5 power. Skin: No rashes, lesions or ulcers Psychiatry: Judgement and insight appear normal. Mood & affect appropriate.   Data Reviewed: I have personally reviewed following labs and imaging studies  CBC: Recent Labs  Lab 08/10/17 1645 08/10/17 1841 08/11/17 0301  WBC  --  14.2* 8.8  NEUTROABS  --  10.3*  --   HGB 14.3 11.7* 10.1*  HCT 42.0 36.9 32.7*  MCV  --  84.6 85.2  PLT  --  309 191   Basic Metabolic Panel: Recent Labs  Lab 08/10/17 1645 08/10/17 1841 08/11/17 0301  NA 133* 135 137  K 5.0 5.3* 4.5  CL 98* 98* 105  CO2  --  22 20*  GLUCOSE 107* 133* 136*  BUN 65* 68* 67*  CREATININE 6.00* 5.62* 5.26*  CALCIUM  --  8.8* 7.8*   GFR: Estimated Creatinine Clearance: 11.7 mL/min (A) (by C-G formula based on SCr of 5.26 mg/dL (H)). Liver Function Tests: Recent Labs  Lab 08/10/17 1841  AST 17  ALT 12*  ALKPHOS 91  BILITOT 0.6  PROT 7.4  ALBUMIN 3.6   No results for input(s): LIPASE, AMYLASE in the last 168 hours. No results for input(s): AMMONIA in the last 168 hours. Coagulation Profile: No results for input(s): INR, PROTIME in the last 168 hours. Cardiac Enzymes: No results for input(s): CKTOTAL, CKMB, CKMBINDEX, TROPONINI in the last 168 hours. BNP (last 3 results) No results for input(s): PROBNP in the last 8760 hours. HbA1C: No results for input(s): HGBA1C in the last 72 hours. CBG: Recent Labs  Lab 08/11/17 1205  GLUCAP 127*   Lipid Profile: No results for input(s): CHOL, HDL, LDLCALC, TRIG, CHOLHDL, LDLDIRECT in the last 72 hours. Thyroid Function Tests: No results for input(s): TSH, T4TOTAL, FREET4, T3FREE, THYROIDAB in the last 72 hours. Anemia Panel: No results for input(s): VITAMINB12, FOLATE, FERRITIN, TIBC, IRON, RETICCTPCT in the last 72 hours. Sepsis Labs: No results for input(s): PROCALCITON, LATICACIDVEN in the last 168 hours.  Recent Results (from the past 240 hour(s))  Culture,  blood (routine x 2)     Status: None (Preliminary result)   Collection Time: 08/10/17  6:37 PM  Result Value Ref Range Status   Specimen Description BLOOD LEFT ANTECUBITAL  Final   Special Requests   Final    BOTTLES DRAWN AEROBIC AND ANAEROBIC Blood Culture adequate volume   Culture NO GROWTH < 12 HOURS  Final   Report Status PENDING  Incomplete  Culture, blood (routine x 2)     Status: None (Preliminary result)   Collection Time: 08/10/17  6:41 PM  Result Value Ref Range Status   Specimen Description BLOOD LEFT HAND  Final   Special Requests IN PEDIATRIC BOTTLE Blood Culture adequate volume  Final   Culture NO GROWTH < 12 HOURS  Final   Report Status PENDING  Incomplete  Gastrointestinal Panel by PCR , Stool     Status: None   Collection Time: 08/10/17  9:06 PM  Result Value Ref Range Status  Campylobacter species NOT DETECTED NOT DETECTED Final   Plesimonas shigelloides NOT DETECTED NOT DETECTED Final   Salmonella species NOT DETECTED NOT DETECTED Final   Yersinia enterocolitica NOT DETECTED NOT DETECTED Final   Vibrio species NOT DETECTED NOT DETECTED Final   Vibrio cholerae NOT DETECTED NOT DETECTED Final   Enteroaggregative E coli (EAEC) NOT DETECTED NOT DETECTED Final   Enteropathogenic E coli (EPEC) NOT DETECTED NOT DETECTED Final   Enterotoxigenic E coli (ETEC) NOT DETECTED NOT DETECTED Final   Shiga like toxin producing E coli (STEC) NOT DETECTED NOT DETECTED Final   Shigella/Enteroinvasive E coli (EIEC) NOT DETECTED NOT DETECTED Final   Cryptosporidium NOT DETECTED NOT DETECTED Final   Cyclospora cayetanensis NOT DETECTED NOT DETECTED Final   Entamoeba histolytica NOT DETECTED NOT DETECTED Final   Giardia lamblia NOT DETECTED NOT DETECTED Final   Adenovirus F40/41 NOT DETECTED NOT DETECTED Final   Astrovirus NOT DETECTED NOT DETECTED Final   Norovirus GI/GII NOT DETECTED NOT DETECTED Final   Rotavirus A NOT DETECTED NOT DETECTED Final   Sapovirus (I, II, IV, and V)  NOT DETECTED NOT DETECTED Final    Comment: Performed at [facility], [address] [place], [place] [address]  MRSA PCR Screening     Status: None   Collection Time: 08/10/17 10:49 PM  Result Value Ref Range Status   MRSA by PCR NEGATIVE NEGATIVE Final    Comment:        The GeneXpert MRSA Assay (FDA approved for NASAL specimens only), is one component of a comprehensive MRSA colonization surveillance program. It is not intended to diagnose MRSA infection nor to guide or monitor treatment for MRSA infections.        Radiology Studies: US Renal  Result Date: 08/11/2017 CLINICAL DATA:  Acute kidney injury. EXAM: RENAL / URINARY TRACT ULTRASOUND COMPLETE COMPARISON:  CT scan of the abdomen dated 01/27/2017 FINDINGS: Right Kidney: Length: 11.9 cm. Echogenicity within normal limits. No mass or hydronephrosis visualized. Left Kidney: Length: 10.8 cm. Echogenicity within normal limits. No mass or hydronephrosis visualized. Bladder: Appears normal for degree of bladder distention. IMPRESSION: Normal appearing kidneys and bladder. Electronically Signed   By: Lorriane Shire M.D.   On: 08/11/2017 11:38   Dg Chest Port 1 View  Result Date: 08/11/2017 CLINICAL DATA:  Sepsis Hx of HTN- on meds, DM- on meds, congestive heart failure, non smoker EXAM: PORTABLE CHEST - 1 VIEW COMPARISON:  08/10/2017 FINDINGS: Stable left subclavian AICD.  Lungs are clear. Heart size and mediastinal contours are within normal limits. No effusion. Spurring in the thoracic spine.  No acute osseous findings. IMPRESSION: No acute cardiopulmonary disease. Electronically Signed   By: Lucrezia Europe M.D.   On: 08/11/2017 09:57   Dg Chest Port 1 View  Result Date: 08/10/2017 CLINICAL DATA:  Generalized weakness EXAM: PORTABLE CHEST 1 VIEW COMPARISON:  05/06/2017 FINDINGS: Left-sided pacing device as before. Borderline cardiomegaly. No consolidation or effusion. No pneumothorax. IMPRESSION: No active disease.  Electronically Signed   By: Donavan Foil M.D.   On: 08/10/2017 18:49   Dg Abd Portable 2 Views  Result Date: 08/10/2017 CLINICAL DATA:  Vomiting and nausea EXAM: PORTABLE ABDOMEN - 2 VIEW COMPARISON:  CT abdomen pelvis 01/27/2017 FINDINGS: No free air beneath the diaphragm. Nonobstructed bowel-gas pattern. No abnormal calcifications. IMPRESSION: Nonobstructed gas pattern. Electronically Signed   By: Donavan Foil M.D.   On: 08/10/2017 18:51      Scheduled Meds: . enoxaparin (LOVENOX) injection  30 mg  Subcutaneous Q24H  . escitalopram  10 mg Oral QHS  . insulin aspart  0-9 Units Subcutaneous TID WC  . insulin glargine  25 Units Subcutaneous BID  . levothyroxine  88 mcg Oral QAC breakfast  . rosuvastatin  10 mg Oral QHS  . sodium chloride flush  3 mL Intravenous Q12H   Continuous Infusions: . sodium chloride 75 mL/hr at 08/11/17 0500     LOS: 1 day    Time spent: 40 minutes   Crystal Phi, DO [facility] [email or web address] Password [facility] 08/11/2017, 3:05 PM

## 2017-08-11 NOTE — Progress Notes (Signed)
Transported down for Renal US by bed.

## 2017-08-12 LAB — CBC
HCT: 32.2 % — ABNORMAL LOW (ref 36.0–46.0)
HEMOGLOBIN: 10.2 g/dL — AB (ref 12.0–15.0)
MCH: 26.5 pg (ref 26.0–34.0)
MCHC: 31.7 g/dL (ref 30.0–36.0)
MCV: 83.6 fL (ref 78.0–100.0)
PLATELETS: 206 10*3/uL (ref 150–400)
RBC: 3.85 MIL/uL — AB (ref 3.87–5.11)
RDW: 15.1 % (ref 11.5–15.5)
WBC: 6.8 10*3/uL (ref 4.0–10.5)

## 2017-08-12 LAB — GLUCOSE, CAPILLARY
GLUCOSE-CAPILLARY: 91 mg/dL (ref 65–99)
GLUCOSE-CAPILLARY: 117 mg/dL — AB (ref 65–99)
Glucose-Capillary: 99 mg/dL (ref 65–99)
Glucose-Capillary: 139 mg/dL — ABNORMAL HIGH (ref 65–99)

## 2017-08-12 LAB — BASIC METABOLIC PANEL
ANION GAP: 9 (ref 5–15)
BUN: 47 mg/dL — AB (ref 6–20)
CHLORIDE: 108 mmol/L (ref 101–111)
CO2: 21 mmol/L — ABNORMAL LOW (ref 22–32)
Calcium: 8.6 mg/dL — ABNORMAL LOW (ref 8.9–10.3)
Creatinine, Ser: 2.71 mg/dL — ABNORMAL HIGH (ref 0.44–1.00)
GFR calc Af Amer: 20 mL/min — ABNORMAL LOW (ref >60)
GFR, EST NON AFRICAN AMERICAN: 17 mL/min — AB (ref >60)
Glucose, Bld: 105 mg/dL — ABNORMAL HIGH (ref 65–99)
POTASSIUM: 4.4 mmol/L (ref 3.5–5.1)
SODIUM: 138 mmol/L (ref 135–145)

## 2017-08-12 MED ORDER — CARVEDILOL 12.5 MG PO TABS
12.5000 mg | ORAL_TABLET | Freq: Two times a day (BID) | ORAL | Status: DC
  Administered 2017-08-12 – 2017-08-13 (×2): 12.5 mg via ORAL
  Filled 2017-08-12 (×2): qty 1

## 2017-08-12 NOTE — Progress Notes (Signed)
Transferred to 6N room 7 by wheelchair, stable, report given to RN, belongings with pt.

## 2017-08-12 NOTE — Progress Notes (Signed)
Pt declined use of CPAP tonight.  Pt resting comfortably- no distress. Pt will call if she changes her mind.

## 2017-08-12 NOTE — Progress Notes (Signed)
PROGRESS NOTE    Crystal [name]  YNW:[id number] DOB: 09/24/1950 DOA: 08/10/2017 PCP: Gildardo Cranker, DO     Brief Narrative:  Crystal [name] is a 67 y.o. female with past medical history significant for asthma, heart failure, CKD, depression, diabetes presents emergency room with vomiting and weakness.  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 Patient had reversal of her colostomy in December that acute issue.  She describes having constipation as well as diarrhea since then, which she was told would be expected.  Then starting a few days ago, she started to have nausea, vomiting, diarrhea as well as decreased urine output.  She denies any sick contacts.  She did take 3 days of antibiotics prior to her colostomy reversal in December.  Assessment & Plan:   Principal Problem:   Gastroenteritis Active Problems:   Essential hypertension   Diabetes mellitus, type 2 (HCC)   Chronic systolic heart failure (HCC)   Hypovolemia   Hypotension   AKI (acute kidney injury) (HCC)  Hypovolemia and hypotension secondary to severe dehydration due to gastroenteritis -Most likely secondary to viral etiology -Stop antibiotics and monitor -Stool studies are negative. C Diff negative  -BP stable today   Acute kidney injury -Baseline creatinine 1.5-1.7 -Renal ultrasound unremarkable -Improving with IV fluids, continue. Repeat BMP in AM   Chronic diastolic heart failure -Not in acute exacerbation, she is hypovolemic on presentation  Hyperlipidemia -Continue statin  Depression -Continue Lexapro  Diabetes mellitus type 2 -Sliding scale insulin and Lantus  Hypothyroidism -Continue Synthroid  Hypertension -Resume coreg   OSA -CPAP qhs    DVT prophylaxis: Lovenox  Code Status: Full code Family Communication: no family at bedside Disposition Plan: Pending improvement in creatinine, if  continues to improve can discharge home tomorrow. Will transfer to medsurg today    Consultants:   None  Procedures:   None  Antimicrobials:  Anti-infectives (From admission, onward)   Start     Dose/Rate Route Frequency Ordered Stop   08/11/17 0500  piperacillin-tazobactam (ZOSYN) IVPB 2.25 g  Status:  Discontinued     2.25 g 100 mL/hr over 30 Minutes Intravenous Every 8 hours 08/10/17 2031 08/11/17 0902   08/10/17 2045  vancomycin (VANCOCIN) 1,750 mg in sodium chloride 0.9 % 500 mL IVPB     1,750 mg 250 mL/hr over 120 Minutes Intravenous  Once 08/10/17 2031 08/11/17 0032   08/10/17 2045  piperacillin-tazobactam (ZOSYN) IVPB 3.375 g     3.375 g 100 mL/hr over 30 Minutes Intravenous  Once 08/10/17 2031 08/10/17 2154       Subjective: Patient feeling well.  She was able to tolerate her meals without vomiting yesterday.  Her diarrhea also has improved.  Denies any abdominal pain.  Objective: Vitals:   08/12/17 0339 08/12/17 0541 08/12/17 0747 08/12/17 0840  BP: (!) 135/56   (!) 148/84  Pulse:    84  Resp: 19   16  Temp: 98.6 F (37 C)   98.3 F (36.8 C)  TempSrc: Oral   Oral  SpO2:   98% 100%  Weight:  93 kg (205 lb 0.4 oz)    Height:        Intake/Output Summary (Last 24 hours) at 08/12/2017 1121 Last data filed at 08/12/2017 1100 Gross per 24 hour  Intake 2022 ml  Output 450 ml  Net 1572 ml   Filed Weights   08/10/17 2203 08/11/17 0611 08/12/17  0541  Weight: 91.5 kg (201 lb 11.5 oz) 92.9 kg (204 lb 12.9 oz) 93 kg (205 lb 0.4 oz)    Examination:  General exam: Appears calm and comfortable  Respiratory system: Clear to auscultation. Respiratory effort normal. Cardiovascular system: S1 & S2 heard, RRR. No JVD, murmurs, rubs, gallops or clicks. No pedal edema. Gastrointestinal system: Abdomen is nondistended, soft and nontender. No organomegaly or masses felt. Normal bowel sounds heard. Central nervous system: Alert and oriented. No focal neurological  deficits. Extremities: Symmetric 5 x 5 power. Skin: No rashes, lesions or ulcers Psychiatry: Judgement and insight appear normal. Mood & affect appropriate.   Data Reviewed: I have personally reviewed following labs and imaging studies  CBC: Recent Labs  Lab 08/10/17 1645 08/10/17 1841 08/11/17 0301 08/12/17 0326  WBC  --  14.2* 8.8 6.8  NEUTROABS  --  10.3*  --   --   HGB 14.3 11.7* 10.1* 10.2*  HCT 42.0 36.9 32.7* 32.2*  MCV  --  84.6 85.2 83.6  PLT  --  309 222 283   Basic Metabolic Panel: Recent Labs  Lab 08/10/17 1645 08/10/17 1841 08/11/17 0301 08/12/17 0326  NA 133* 135 137 138  K 5.0 5.3* 4.5 4.4  CL 98* 98* 105 108  CO2  --  22 20* 21*  GLUCOSE 107* 133* 136* 105*  BUN 65* 68* 67* 47*  CREATININE 6.00* 5.62* 5.26* 2.71*  CALCIUM  --  8.8* 7.8* 8.6*   GFR: Estimated Creatinine Clearance: 22.7 mL/min (A) (by C-G formula based on SCr of 2.71 mg/dL (H)). Liver Function Tests: Recent Labs  Lab 08/10/17 1841  AST 17  ALT 12*  ALKPHOS 91  BILITOT 0.6  PROT 7.4  ALBUMIN 3.6   No results for input(s): LIPASE, AMYLASE in the last 168 hours. No results for input(s): AMMONIA in the last 168 hours. Coagulation Profile: No results for input(s): INR, PROTIME in the last 168 hours. Cardiac Enzymes: No results for input(s): CKTOTAL, CKMB, CKMBINDEX, TROPONINI in the last 168 hours. BNP (last 3 results) No results for input(s): PROBNP in the last 8760 hours. HbA1C: No results for input(s): HGBA1C in the last 72 hours. CBG: Recent Labs  Lab 08/11/17 1205 08/11/17 1602 08/11/17 2202 08/12/17 0836  GLUCAP 127* 160* 182* 91   Lipid Profile: No results for input(s): CHOL, HDL, LDLCALC, TRIG, CHOLHDL, LDLDIRECT in the last 72 hours. Thyroid Function Tests: No results for input(s): TSH, T4TOTAL, FREET4, T3FREE, THYROIDAB in the last 72 hours. Anemia Panel: No results for input(s): VITAMINB12, FOLATE, FERRITIN, TIBC, IRON, RETICCTPCT in the last 72  hours. Sepsis Labs: No results for input(s): PROCALCITON, LATICACIDVEN in the last 168 hours.  Recent Results (from the past 240 hour(s))  C difficile quick scan w PCR reflex     Status: None   Collection Time: 08/10/17  4:29 PM  Result Value Ref Range Status   C Diff antigen NEGATIVE NEGATIVE Final   C Diff toxin NEGATIVE NEGATIVE Final   C Diff interpretation No C. difficile detected.  Final  Culture, blood (routine x 2)     Status: None (Preliminary result)   Collection Time: 08/10/17  6:37 PM  Result Value Ref Range Status   Specimen Description BLOOD LEFT ANTECUBITAL  Final   Special Requests   Final    BOTTLES DRAWN AEROBIC AND ANAEROBIC Blood Culture adequate volume   Culture NO GROWTH < 12 HOURS  Final   Report Status PENDING  Incomplete  Culture, blood (routine  x 2)     Status: None (Preliminary result)   Collection Time: 08/10/17  6:41 PM  Result Value Ref Range Status   Specimen Description BLOOD LEFT HAND  Final   Special Requests IN PEDIATRIC BOTTLE Blood Culture adequate volume  Final   Culture NO GROWTH < 12 HOURS  Final   Report Status PENDING  Incomplete  Gastrointestinal Panel by PCR , Stool     Status: None   Collection Time: 08/10/17  9:06 PM  Result Value Ref Range Status   Campylobacter species NOT DETECTED NOT DETECTED Final   Plesimonas shigelloides NOT DETECTED NOT DETECTED Final   Salmonella species NOT DETECTED NOT DETECTED Final   Yersinia enterocolitica NOT DETECTED NOT DETECTED Final   Vibrio species NOT DETECTED NOT DETECTED Final   Vibrio cholerae NOT DETECTED NOT DETECTED Final   Enteroaggregative E coli (EAEC) NOT DETECTED NOT DETECTED Final   Enteropathogenic E coli (EPEC) NOT DETECTED NOT DETECTED Final   Enterotoxigenic E coli (ETEC) NOT DETECTED NOT DETECTED Final   Shiga like toxin producing E coli (STEC) NOT DETECTED NOT DETECTED Final   Shigella/Enteroinvasive E coli (EIEC) NOT DETECTED NOT DETECTED Final   Cryptosporidium NOT DETECTED  NOT DETECTED Final   Cyclospora cayetanensis NOT DETECTED NOT DETECTED Final   Entamoeba histolytica NOT DETECTED NOT DETECTED Final   Giardia lamblia NOT DETECTED NOT DETECTED Final   Adenovirus F40/41 NOT DETECTED NOT DETECTED Final   Astrovirus NOT DETECTED NOT DETECTED Final   Norovirus GI/GII NOT DETECTED NOT DETECTED Final   Rotavirus A NOT DETECTED NOT DETECTED Final   Sapovirus (I, II, IV, and V) NOT DETECTED NOT DETECTED Final    Comment: Performed at [facility], [address] [place], [place] [address]  MRSA PCR Screening     Status: None   Collection Time: 08/10/17 10:49 PM  Result Value Ref Range Status   MRSA by PCR NEGATIVE NEGATIVE Final    Comment:        The GeneXpert MRSA Assay (FDA approved for NASAL specimens only), is one component of a comprehensive MRSA colonization surveillance program. It is not intended to diagnose MRSA infection nor to guide or monitor treatment for MRSA infections.        Radiology Studies: US Renal  Result Date: 08/11/2017 CLINICAL DATA:  Acute kidney injury. EXAM: RENAL / URINARY TRACT ULTRASOUND COMPLETE COMPARISON:  CT scan of the abdomen dated 01/27/2017 FINDINGS: Right Kidney: Length: 11.9 cm. Echogenicity within normal limits. No mass or hydronephrosis visualized. Left Kidney: Length: 10.8 cm. Echogenicity within normal limits. No mass or hydronephrosis visualized. Bladder: Appears normal for degree of bladder distention. IMPRESSION: Normal appearing kidneys and bladder. Electronically Signed   By: Lorriane Shire M.D.   On: 08/11/2017 11:38   Dg Chest Port 1 View  Result Date: 08/11/2017 CLINICAL DATA:  Sepsis Hx of HTN- on meds, DM- on meds, congestive heart failure, non smoker EXAM: PORTABLE CHEST - 1 VIEW COMPARISON:  08/10/2017 FINDINGS: Stable left subclavian AICD.  Lungs are clear. Heart size and mediastinal contours are within normal limits. No effusion. Spurring in the thoracic spine.  No acute osseous  findings. IMPRESSION: No acute cardiopulmonary disease. Electronically Signed   By: Lucrezia Europe M.D.   On: 08/11/2017 09:57   Dg Chest Port 1 View  Result Date: 08/10/2017 CLINICAL DATA:  Generalized weakness EXAM: PORTABLE CHEST 1 VIEW COMPARISON:  05/06/2017 FINDINGS: Left-sided pacing device as before. Borderline cardiomegaly. No consolidation or effusion. No pneumothorax.  IMPRESSION: No active disease. Electronically Signed   By: Donavan Foil M.D.   On: 08/10/2017 18:49   Dg Abd Portable 2 Views  Result Date: 08/10/2017 CLINICAL DATA:  Vomiting and nausea EXAM: PORTABLE ABDOMEN - 2 VIEW COMPARISON:  CT abdomen pelvis 01/27/2017 FINDINGS: No free air beneath the diaphragm. Nonobstructed bowel-gas pattern. No abnormal calcifications. IMPRESSION: Nonobstructed gas pattern. Electronically Signed   By: Donavan Foil M.D.   On: 08/10/2017 18:51      Scheduled Meds: . carvedilol  12.5 mg Oral BID WC  . enoxaparin (LOVENOX) injection  30 mg Subcutaneous Q24H  . escitalopram  10 mg Oral QHS  . insulin aspart  0-9 Units Subcutaneous TID WC  . insulin glargine  25 Units Subcutaneous BID  . levothyroxine  88 mcg Oral QAC breakfast  . rosuvastatin  10 mg Oral QHS  . sodium chloride flush  3 mL Intravenous Q12H   Continuous Infusions: . sodium chloride 75 mL/hr at 08/12/17 0509     LOS: 2 days    Time spent: 30 minutes   Dessa Phi, DO [facility] [email or web address] Password [email or web address] 08/12/2017, 11:21 AM

## 2017-08-12 NOTE — Plan of Care (Signed)
Pt ambulates often with minimal assistance. Will continue to monitor.

## 2017-08-13 LAB — BASIC METABOLIC PANEL
Anion gap: 12 (ref 5–15)
BUN: 29 mg/dL — ABNORMAL HIGH (ref 6–20)
CALCIUM: 9.3 mg/dL (ref 8.9–10.3)
CO2: 23 mmol/L (ref 22–32)
Chloride: 101 mmol/L (ref 101–111)
Creatinine, Ser: 1.67 mg/dL — ABNORMAL HIGH (ref 0.44–1.00)
GFR calc Af Amer: 36 mL/min — ABNORMAL LOW (ref >60)
GFR, EST NON AFRICAN AMERICAN: 31 mL/min — AB (ref >60)
GLUCOSE: 158 mg/dL — AB (ref 65–99)
POTASSIUM: 4.6 mmol/L (ref 3.5–5.1)
SODIUM: 136 mmol/L (ref 135–145)

## 2017-08-13 LAB — GLUCOSE, CAPILLARY
GLUCOSE-CAPILLARY: 92 mg/dL (ref 65–99)
Glucose-Capillary: 167 mg/dL — ABNORMAL HIGH (ref 65–99)

## 2017-08-13 NOTE — Discharge Instructions (Signed)

## 2017-08-13 NOTE — Progress Notes (Signed)
Pt ready for discharge.  DC instructions given and reviewed.

## 2017-08-13 NOTE — Discharge Summary (Signed)
Physician Discharge Summary  Crystal [name] ZDG:[id number] DOB: Mar 03, 1951 DOA: 08/10/2017  PCP: Crystal Cranker, DO  Admit date: 08/10/2017 Discharge date: 08/13/2017  Admitted From: Home Disposition:  Home  Recommendations for Outpatient Follow-up:  1. Follow up with PCP in 1 week 2. Please obtain BMP in 1 week   Discharge Condition: Stable CODE STATUS: Full  Diet recommendation: Heart healthy   Brief/Interim Summary: Crystal L Hughesis a 67 y.o.femalewith past medical history significant for asthma, heart failure, CKD, depression, diabetes presents emergency room with vomiting and weakness.  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 Patient had reversal of her colostomy in December that acute issue.  She describes having constipation as well as diarrhea since then, which she was told would be expected.  Then starting a few days ago, she started to have nausea, vomiting, diarrhea as well as decreased urine output.  She denies any sick contacts.  She did take 3 days of antibiotics prior to her colostomy reversal in December.   Work up was negative for C Diff, stool PCR was negative.  Patient was treated with IV fluids for acute kidney injury.  Creatinine returned to baseline with IV fluids.  Renal ultrasound was unremarkable. Patient had tolerated meals without any nausea vomiting or diarrhea. Blood pressure improved with IV fluid.  Discharge Diagnoses:  Principal Problem:   Gastroenteritis Active Problems:   Essential hypertension   Diabetes mellitus, type 2 (HCC)   Chronic systolic heart failure (HCC)   Hypovolemia   Hypotension   AKI (acute kidney injury) (HCC)  Hypovolemia and hypotension secondary to severe dehydration due to gastroenteritis -Most likely secondary to viral etiology -Stop antibiotics and monitor -Stool studies are negative. C Diff negative  -BP stable today    Acute kidney injury -Baseline creatinine 1.5-1.7 -Renal ultrasound unremarkable -Cr returned to baseline prior to discharge   Chronic diastolic heart failure -Not in acute exacerbation, she is hypovolemic on presentation  Hyperlipidemia -Continue statin  Depression -Continue Lexapro  Diabetes mellitus type 2 -Sliding scale insulin and Lantus  Hypothyroidism -Continue Synthroid  Hypertension -Resume coreg   OSA -CPAP qhs     Discharge Instructions  Discharge Instructions    Call MD for:   Complete by:  As directed    Increased nausea, vomiting, diarrhea, decreased urine output   Call MD for:  difficulty breathing, headache or visual disturbances   Complete by:  As directed    Call MD for:  extreme fatigue   Complete by:  As directed    Call MD for:  hives   Complete by:  As directed    Call MD for:  persistant dizziness or light-headedness   Complete by:  As directed    Call MD for:  persistant nausea and vomiting   Complete by:  As directed    Call MD for:  severe uncontrolled pain   Complete by:  As directed    Call MD for:  temperature >100.4   Complete by:  As directed    Diet - low sodium heart healthy   Complete by:  As directed    Discharge instructions   Complete by:  As directed    You were cared for by a hospitalist during your hospital stay. If you have any questions about your discharge medications or the care you received while you were in the hospital after you are discharged, you can call the unit and asked  to speak with the hospitalist on call if the hospitalist that took care of you is not available. Once you are discharged, your primary care physician will handle any further medical issues. Please note that NO REFILLS for any discharge medications will be authorized once you are discharged, as it is imperative that you return to your primary care physician (or establish a relationship with a primary care physician if you do not have  one) for your aftercare needs so that they can reassess your need for medications and monitor your lab values.   Increase activity slowly   Complete by:  As directed      Allergies as of 08/13/2017      Reactions   Glucophage [metformin Hcl] Other (See Comments)   Renal failure   Ace Inhibitors Swelling, Other (See Comments)   Angioedema   Advicor [niacin-lovastatin Er] Other (See Comments)   Muscle aches   Bystolic [nebivolol Hcl] Other (See Comments)   Edema   Erythromycin Diarrhea, Nausea And Vomiting, Other (See Comments)   *DERIVATIVES*   Lipitor [atorvastatin] Other (See Comments)   Muscle aches   Lopid [gemfibrozil] Other (See Comments)   Muscle aches   Statins Other (See Comments)   Muscle aches   Adhesive [tape] Rash      Medication List    TAKE these medications   acetaminophen 500 MG tablet Commonly known as:  TYLENOL Take 1,000 mg by mouth every 6 (six) hours as needed for moderate pain or headache.   carvedilol 12.5 MG tablet Commonly known as:  COREG Take 12.5 mg by mouth 2 (two) times daily with a meal.   CRESTOR 10 MG tablet Generic drug:  rosuvastatin Take 1 tablet (10 mg total) by mouth at bedtime.   escitalopram 10 MG tablet Commonly known as:  LEXAPRO Take 10 mg by mouth at bedtime.   ferrous sulfate 325 (65 FE) MG EC tablet Take 325 mg by mouth daily.   gabapentin 100 MG capsule Commonly known as:  NEURONTIN Take 100-800 mg by mouth See admin instructions. Take 100 mg by mouth in the morning and take 800 mg by mouth at bedtime   Insulin Glargine 300 UNIT/ML Sopn Commonly known as:  TOUJEO SOLOSTAR Inject 50 Units into the skin 2 (two) times daily.   Insulin Glulisine 100 UNIT/ML Solostar Pen Commonly known as:  APIDRA Inject 20 Units into the skin 3 (three) times daily.   levothyroxine 88 MCG tablet Commonly known as:  SYNTHROID, LEVOTHROID Take 88 mcg by mouth daily.   loratadine 10 MG tablet Commonly known as:  CLARITIN Take 10  mg by mouth daily.   montelukast 10 MG tablet Commonly known as:  SINGULAIR Take 10 mg by mouth at bedtime.   omega-3 acid ethyl esters 1 g capsule Commonly known as:  LOVAZA Take 4 g by mouth at bedtime.   ondansetron 4 MG disintegrating tablet Commonly known as:  ZOFRAN-ODT Take 4 mg by mouth every 6 (six) hours as needed.   telmisartan 20 MG tablet Commonly known as:  MICARDIS TAKE 1 TABLET BY MOUTH DAILY What changed:    how much to take  how to take this  when to take this   temazepam 15 MG capsule Commonly known as:  RESTORIL Take 1 capsule (15 mg total) by mouth at bedtime as needed for sleep.   traMADol 50 MG tablet Commonly known as:  ULTRAM Take 2 tablets (100 mg total) by mouth 2 (two) times daily. What changed:  when to take this  reasons to take this   ULORIC 40 MG tablet Generic drug:  febuxostat Take 40 mg by mouth daily.      Follow-up Information    Crystal Cranker, DO. Schedule an appointment as soon as possible for a visit in 1 week(s).   Specialty:  Internal Medicine Why:  Will need repeat BMP in 1 week to recheck kidney function  Contact information: [place] [address] [phone]          Allergies  Allergen Reactions  . Glucophage [Metformin Hcl] Other (See Comments)    Renal failure  . Ace Inhibitors Swelling and Other (See Comments)    Angioedema  . Advicor [Niacin-Lovastatin Er] Other (See Comments)    Muscle aches  . Bystolic [Nebivolol Hcl] Other (See Comments)    Edema  . Erythromycin Diarrhea, Nausea And Vomiting and Other (See Comments)    *DERIVATIVES*  . Lipitor [Atorvastatin] Other (See Comments)    Muscle aches  . Lopid [Gemfibrozil] Other (See Comments)    Muscle aches  . Statins Other (See Comments)    Muscle aches  . Adhesive [Tape] Rash    Consultations:  None   Procedures/Studies: US Renal  Result Date: 08/11/2017 CLINICAL DATA:  Acute kidney injury. EXAM: RENAL /  URINARY TRACT ULTRASOUND COMPLETE COMPARISON:  CT scan of the abdomen dated 01/27/2017 FINDINGS: Right Kidney: Length: 11.9 cm. Echogenicity within normal limits. No mass or hydronephrosis visualized. Left Kidney: Length: 10.8 cm. Echogenicity within normal limits. No mass or hydronephrosis visualized. Bladder: Appears normal for degree of bladder distention. IMPRESSION: Normal appearing kidneys and bladder. Electronically Signed   By: Lorriane Shire M.D.   On: 08/11/2017 11:38   Dg Chest Port 1 View  Result Date: 08/11/2017 CLINICAL DATA:  Sepsis Hx of HTN- on meds, DM- on meds, congestive heart failure, non smoker EXAM: PORTABLE CHEST - 1 VIEW COMPARISON:  08/10/2017 FINDINGS: Stable left subclavian AICD.  Lungs are clear. Heart size and mediastinal contours are within normal limits. No effusion. Spurring in the thoracic spine.  No acute osseous findings. IMPRESSION: No acute cardiopulmonary disease. Electronically Signed   By: Lucrezia Europe M.D.   On: 08/11/2017 09:57   Dg Chest Port 1 View  Result Date: 08/10/2017 CLINICAL DATA:  Generalized weakness EXAM: PORTABLE CHEST 1 VIEW COMPARISON:  05/06/2017 FINDINGS: Left-sided pacing device as before. Borderline cardiomegaly. No consolidation or effusion. No pneumothorax. IMPRESSION: No active disease. Electronically Signed   By: Donavan Foil M.D.   On: 08/10/2017 18:49   Dg Abd Portable 2 Views  Result Date: 08/10/2017 CLINICAL DATA:  Vomiting and nausea EXAM: PORTABLE ABDOMEN - 2 VIEW COMPARISON:  CT abdomen pelvis 01/27/2017 FINDINGS: No free air beneath the diaphragm. Nonobstructed bowel-gas pattern. No abnormal calcifications. IMPRESSION: Nonobstructed gas pattern. Electronically Signed   By: Donavan Foil M.D.   On: 08/10/2017 18:51      Discharge Exam: Vitals:   08/12/17 2211 08/13/17 0525  BP: (!) 149/79 133/63  Pulse: 81 80  Resp: 18 18  Temp: 98 F (36.7 C) 98.8 F (37.1 C)  SpO2: 99% 96%   Vitals:   08/12/17 1219 08/12/17 1350  08/12/17 2211 08/13/17 0525  BP: 140/61 (!) 153/57 (!) 149/79 133/63  Pulse: 82 89 81 80  Resp: 18 20 18 18   Temp: 99 F (37.2 C) 98.7 F (37.1 C) 98 F (36.7 C) 98.8 F (37.1 C)  TempSrc: Oral Oral Oral   SpO2:  98%  99% 96%  Weight:      Height:        General: Pt is alert, awake, not in acute distress Cardiovascular: RRR, S1/S2 +, no rubs, no gallops Respiratory: CTA bilaterally, no wheezing, no rhonchi Abdominal: Soft, NT, ND, bowel sounds + Extremities: no edema, no cyanosis    The results of significant diagnostics from this hospitalization (including imaging, microbiology, ancillary and laboratory) are listed below for reference.     Microbiology: Recent Results (from the past 240 hour(s))  C difficile quick scan w PCR reflex     Status: None   Collection Time: 08/10/17  4:29 PM  Result Value Ref Range Status   C Diff antigen NEGATIVE NEGATIVE Final   C Diff toxin NEGATIVE NEGATIVE Final   C Diff interpretation No C. difficile detected.  Final  Culture, blood (routine x 2)     Status: None (Preliminary result)   Collection Time: 08/10/17  6:37 PM  Result Value Ref Range Status   Specimen Description BLOOD LEFT ANTECUBITAL  Final   Special Requests   Final    BOTTLES DRAWN AEROBIC AND ANAEROBIC Blood Culture adequate volume   Culture NO GROWTH 2 DAYS  Final   Report Status PENDING  Incomplete  Culture, blood (routine x 2)     Status: None (Preliminary result)   Collection Time: 08/10/17  6:41 PM  Result Value Ref Range Status   Specimen Description BLOOD LEFT HAND  Final   Special Requests IN PEDIATRIC BOTTLE Blood Culture adequate volume  Final   Culture NO GROWTH 2 DAYS  Final   Report Status PENDING  Incomplete  Gastrointestinal Panel by PCR , Stool     Status: None   Collection Time: 08/10/17  9:06 PM  Result Value Ref Range Status   Campylobacter species NOT DETECTED NOT DETECTED Final   Plesimonas shigelloides NOT DETECTED NOT DETECTED Final    Salmonella species NOT DETECTED NOT DETECTED Final   Yersinia enterocolitica NOT DETECTED NOT DETECTED Final   Vibrio species NOT DETECTED NOT DETECTED Final   Vibrio cholerae NOT DETECTED NOT DETECTED Final   Enteroaggregative E coli (EAEC) NOT DETECTED NOT DETECTED Final   Enteropathogenic E coli (EPEC) NOT DETECTED NOT DETECTED Final   Enterotoxigenic E coli (ETEC) NOT DETECTED NOT DETECTED Final   Shiga like toxin producing E coli (STEC) NOT DETECTED NOT DETECTED Final   Shigella/Enteroinvasive E coli (EIEC) NOT DETECTED NOT DETECTED Final   Cryptosporidium NOT DETECTED NOT DETECTED Final   Cyclospora cayetanensis NOT DETECTED NOT DETECTED Final   Entamoeba histolytica NOT DETECTED NOT DETECTED Final   Giardia lamblia NOT DETECTED NOT DETECTED Final   Adenovirus F40/41 NOT DETECTED NOT DETECTED Final   Astrovirus NOT DETECTED NOT DETECTED Final   Norovirus GI/GII NOT DETECTED NOT DETECTED Final   Rotavirus A NOT DETECTED NOT DETECTED Final   Sapovirus (I, II, IV, and V) NOT DETECTED NOT DETECTED Final    Comment: Performed at [facility], [address] [place], [place] [address]  MRSA PCR Screening     Status: None   Collection Time: 08/10/17 10:49 PM  Result Value Ref Range Status   MRSA by PCR NEGATIVE NEGATIVE Final    Comment:        The GeneXpert MRSA Assay (FDA approved for NASAL specimens only), is one component of a comprehensive MRSA colonization surveillance program. It is not intended to diagnose MRSA infection nor to guide or monitor treatment for MRSA infections.  Labs: BNP (last 3 results) No results for input(s): BNP in the last 8760 hours. Basic Metabolic Panel: Recent Labs  Lab 08/10/17 1645 08/10/17 1841 08/11/17 0301 08/12/17 0326 08/13/17 0910  NA 133* 135 137 138 136  K 5.0 5.3* 4.5 4.4 4.6  CL 98* 98* 105 108 101  CO2  --  22 20* 21* 23  GLUCOSE 107* 133* 136* 105* 158*  BUN 65* 68* 67* 47* 29*  CREATININE 6.00* 5.62*  5.26* 2.71* 1.67*  CALCIUM  --  8.8* 7.8* 8.6* 9.3   Liver Function Tests: Recent Labs  Lab 08/10/17 1841  AST 17  ALT 12*  ALKPHOS 91  BILITOT 0.6  PROT 7.4  ALBUMIN 3.6   No results for input(s): LIPASE, AMYLASE in the last 168 hours. No results for input(s): AMMONIA in the last 168 hours. CBC: Recent Labs  Lab 08/10/17 1645 08/10/17 1841 08/11/17 0301 08/12/17 0326  WBC  --  14.2* 8.8 6.8  NEUTROABS  --  10.3*  --   --   HGB 14.3 11.7* 10.1* 10.2*  HCT 42.0 36.9 32.7* 32.2*  MCV  --  84.6 85.2 83.6  PLT  --  309 222 206   Cardiac Enzymes: No results for input(s): CKTOTAL, CKMB, CKMBINDEX, TROPONINI in the last 168 hours. BNP: Invalid input(s): POCBNP CBG: Recent Labs  Lab 08/12/17 0836 08/12/17 1217 08/12/17 1715 08/12/17 2209 08/13/17 0738  GLUCAP 91 99 117* 139* 92   D-Dimer No results for input(s): DDIMER in the last 72 hours. Hgb A1c No results for input(s): HGBA1C in the last 72 hours. Lipid Profile No results for input(s): CHOL, HDL, LDLCALC, TRIG, CHOLHDL, LDLDIRECT in the last 72 hours. Thyroid function studies No results for input(s): TSH, T4TOTAL, T3FREE, THYROIDAB in the last 72 hours.  Invalid input(s): FREET3 Anemia work up No results for input(s): VITAMINB12, FOLATE, FERRITIN, TIBC, IRON, RETICCTPCT in the last 72 hours. Urinalysis    Component Value Date/Time   COLORURINE STRAW (A) 08/10/2017 2208   APPEARANCEUR CLEAR 08/10/2017 2208   LABSPEC 1.010 08/10/2017 2208   PHURINE 5.0 08/10/2017 2208   GLUCOSEU 50 (A) 08/10/2017 2208   HGBUR NEGATIVE 08/10/2017 2208   BILIRUBINUR NEGATIVE 08/10/2017 2208   KETONESUR NEGATIVE 08/10/2017 2208   PROTEINUR NEGATIVE 08/10/2017 2208   NITRITE NEGATIVE 08/10/2017 2208   LEUKOCYTESUR TRACE (A) 08/10/2017 2208   Sepsis Labs Invalid input(s): PROCALCITONIN,  WBC,  LACTICIDVEN Microbiology Recent Results (from the past 240 hour(s))  C difficile quick scan w PCR reflex     Status: None    Collection Time: 08/10/17  4:29 PM  Result Value Ref Range Status   C Diff antigen NEGATIVE NEGATIVE Final   C Diff toxin NEGATIVE NEGATIVE Final   C Diff interpretation No C. difficile detected.  Final  Culture, blood (routine x 2)     Status: None (Preliminary result)   Collection Time: 08/10/17  6:37 PM  Result Value Ref Range Status   Specimen Description BLOOD LEFT ANTECUBITAL  Final   Special Requests   Final    BOTTLES DRAWN AEROBIC AND ANAEROBIC Blood Culture adequate volume   Culture NO GROWTH 2 DAYS  Final   Report Status PENDING  Incomplete  Culture, blood (routine x 2)     Status: None (Preliminary result)   Collection Time: 08/10/17  6:41 PM  Result Value Ref Range Status   Specimen Description BLOOD LEFT HAND  Final   Special Requests IN PEDIATRIC BOTTLE Blood Culture adequate volume  Final   Culture NO GROWTH 2 DAYS  Final   Report Status PENDING  Incomplete  Gastrointestinal Panel by PCR , Stool     Status: None   Collection Time: 08/10/17  9:06 PM  Result Value Ref Range Status   Campylobacter species NOT DETECTED NOT DETECTED Final   Plesimonas shigelloides NOT DETECTED NOT DETECTED Final   Salmonella species NOT DETECTED NOT DETECTED Final   Yersinia enterocolitica NOT DETECTED NOT DETECTED Final   Vibrio species NOT DETECTED NOT DETECTED Final   Vibrio cholerae NOT DETECTED NOT DETECTED Final   Enteroaggregative E coli (EAEC) NOT DETECTED NOT DETECTED Final   Enteropathogenic E coli (EPEC) NOT DETECTED NOT DETECTED Final   Enterotoxigenic E coli (ETEC) NOT DETECTED NOT DETECTED Final   Shiga like toxin producing E coli (STEC) NOT DETECTED NOT DETECTED Final   Shigella/Enteroinvasive E coli (EIEC) NOT DETECTED NOT DETECTED Final   Cryptosporidium NOT DETECTED NOT DETECTED Final   Cyclospora cayetanensis NOT DETECTED NOT DETECTED Final   Entamoeba histolytica NOT DETECTED NOT DETECTED Final   Giardia lamblia NOT DETECTED NOT DETECTED Final   Adenovirus F40/41  NOT DETECTED NOT DETECTED Final   Astrovirus NOT DETECTED NOT DETECTED Final   Norovirus GI/GII NOT DETECTED NOT DETECTED Final   Rotavirus A NOT DETECTED NOT DETECTED Final   Sapovirus (I, II, IV, and V) NOT DETECTED NOT DETECTED Final    Comment: Performed at [facility], [address] [place], [place] [address]  MRSA PCR Screening     Status: None   Collection Time: 08/10/17 10:49 PM  Result Value Ref Range Status   MRSA by PCR NEGATIVE NEGATIVE Final    Comment:        The GeneXpert MRSA Assay (FDA approved for NASAL specimens only), is one component of a comprehensive MRSA colonization surveillance program. It is not intended to diagnose MRSA infection nor to guide or monitor treatment for MRSA infections.      Time coordinating discharge: 30 minutes  SIGNED:  Dessa Phi, DO [facility] Pager [phone]  If 7PM-7AM, please contact night-coverage [email or web address] Password [facility] 08/13/2017, 10:58 AM

## 2017-08-13 NOTE — Progress Notes (Signed)
[place] area mid abd and right side from recent surgery, colostomy reversal.

## 2017-08-14 ENCOUNTER — Telehealth: Payer: Self-pay

## 2017-08-14 NOTE — Telephone Encounter (Signed)
Transition Care Management Follow-Up Telephone Call   Date discharged and where:[facility] on 08/13/2017  How have you been since you were released from the hospital? "I got home yesterday and I have been good since"  Any patient concerns? None  Items Reviewed:   Meds: Y  Allergies: Y  Dietary Changes Reviewed: Y  Functional Questionnaire:  Independent-I Dependent-D  ADLs:   Dressing- I    Eating-I   Maintaining continence- I   Transferring- I   Transportation- I   Meal Prep- I   Managing Meds- I  Confirmed importance and Date/Time of follow-up visits scheduled: Sherrie Mustache, NP 08/17/2017 @ 3:15pm   Confirmed with patient if condition worsens to call PCP or go to the Emergency Dept. Patient was given office number and encouraged to call back with questions or concerns: Yes

## 2017-08-15 LAB — CULTURE, BLOOD (ROUTINE X 2)
Culture: NO GROWTH
Culture: NO GROWTH
SPECIAL REQUESTS: ADEQUATE
Special Requests: ADEQUATE

## 2017-08-17 ENCOUNTER — Ambulatory Visit (INDEPENDENT_AMBULATORY_CARE_PROVIDER_SITE_OTHER): Payer: Medicare Other | Admitting: Nurse Practitioner

## 2017-08-17 ENCOUNTER — Other Ambulatory Visit: Payer: Self-pay | Admitting: Nurse Practitioner

## 2017-08-17 ENCOUNTER — Encounter: Payer: Self-pay | Admitting: Nurse Practitioner

## 2017-08-17 VITALS — BP 120/78 | HR 71 | Temp 98.4°F | Ht 66.0 in | Wt 201.0 lb

## 2017-08-17 DIAGNOSIS — N179 Acute kidney failure, unspecified: Secondary | ICD-10-CM | POA: Diagnosis not present

## 2017-08-17 DIAGNOSIS — R197 Diarrhea, unspecified: Secondary | ICD-10-CM | POA: Diagnosis not present

## 2017-08-17 DIAGNOSIS — K529 Noninfective gastroenteritis and colitis, unspecified: Secondary | ICD-10-CM

## 2017-08-17 DIAGNOSIS — J019 Acute sinusitis, unspecified: Secondary | ICD-10-CM

## 2017-08-17 MED ORDER — FLUTICASONE PROPIONATE 50 MCG/ACT NA SUSP: 1.0000 | Freq: Two times a day (BID) | NASAL | 1 refills | Status: DC

## 2017-08-17 NOTE — Patient Instructions (Signed)
neti pot twice daily Plain nasal saline spray throughout the day as needed May use tylenol 325 mg 2 tablets every 6 hours as needed aches and pains or sore throat humidifier in the home to help with the dry air Keep well hydrated Avoid forcefully blowing nose Mucinex DM by mouth twice daily as needed for cough and congestion with full glass of water   Sinusitis, Adult Sinusitis is soreness and inflammation of your sinuses. Sinuses are hollow spaces in the bones around your face. Your sinuses are located:  Around your eyes.  In the middle of your forehead.  Behind your nose.  In your cheekbones.  Your sinuses and nasal passages are lined with a stringy fluid (mucus). Mucus normally drains out of your sinuses. When your nasal tissues become inflamed or swollen, the mucus can become trapped or blocked so air cannot flow through your sinuses. This allows bacteria, viruses, and funguses to grow, which leads to infection. Sinusitis can develop quickly and last for 7?10 days (acute) or for more than 12 weeks (chronic). Sinusitis often develops after a cold. What are the causes? This condition is caused by anything that creates swelling in the sinuses or stops mucus from draining, including:  Allergies.  Asthma.  Bacterial or viral infection.  Abnormally shaped bones between the nasal passages.  Nasal growths that contain mucus (nasal polyps).  Narrow sinus openings.  Pollutants, such as chemicals or irritants in the air.  A foreign object stuck in the nose.  A fungal infection. This is rare.  What increases the risk? The following factors may make you more likely to develop this condition:  Having allergies or asthma.  Having had a recent cold or respiratory tract infection.  Having structural deformities or blockages in your nose or sinuses.  Having a weak immune system.  Doing a lot of swimming or diving.  Overusing nasal sprays.  Smoking.  What are the signs or  symptoms? The main symptoms of this condition are pain and a feeling of pressure around the affected sinuses. Other symptoms include:  Upper toothache.  Earache.  Headache.  Bad breath.  Decreased sense of smell and taste.  A cough that may get worse at night.  Fatigue.  Fever.  Thick drainage from your nose. The drainage is often green and it may contain pus (purulent).  Stuffy nose or congestion.  Postnasal drip. This is when extra mucus collects in the throat or back of the nose.  Swelling and warmth over the affected sinuses.  Sore throat.  Sensitivity to light.  How is this diagnosed? This condition is diagnosed based on symptoms, a medical history, and a physical exam. To find out if your condition is acute or chronic, your health care provider may:  Look in your nose for signs of nasal polyps.  Tap over the affected sinus to check for signs of infection.  View the inside of your sinuses using an imaging device that has a light attached (endoscope).  If your health care provider suspects that you have chronic sinusitis, you may also:  Be tested for allergies.  Have a sample of mucus taken from your nose (nasal culture) and checked for bacteria.  Have a mucus sample examined to see if your sinusitis is related to an allergy.  If your sinusitis does not respond to treatment and it lasts longer than 8 weeks, you may have an MRI or CT scan to check your sinuses. These scans also help to determine how severe your  infection is. In rare cases, a bone biopsy may be done to rule out more serious types of fungal sinus disease. How is this treated? Treatment for sinusitis depends on the cause and whether your condition is chronic or acute. If a virus is causing your sinusitis, your symptoms will go away on their own within 10 days. You may be given medicines to relieve your symptoms, including:  Topical nasal decongestants. They shrink swollen nasal passages and let  mucus drain from your sinuses.  Antihistamines. These drugs block inflammation that is triggered by allergies. This can help to ease swelling in your nose and sinuses.  Topical nasal corticosteroids. These are nasal sprays that ease inflammation and swelling in your nose and sinuses.  Nasal saline washes. These rinses can help to get rid of thick mucus in your nose.  If your condition is caused by bacteria, you will be given an antibiotic medicine. If your condition is caused by a fungus, you will be given an antifungal medicine. Surgery may be needed to correct underlying conditions, such as narrow nasal passages. Surgery may also be needed to remove polyps. Follow these instructions at home: Medicines  Take, use, or apply over-the-counter and prescription medicines only as told by your health care provider. These may include nasal sprays.  If you were prescribed an antibiotic medicine, take it as told by your health care provider. Do not stop taking the antibiotic even if you start to feel better. Hydrate and Humidify  Drink enough water to keep your urine clear or pale yellow. Staying hydrated will help to thin your mucus.  Use a cool mist humidifier to keep the humidity level in your home above 50%.  Inhale steam for 10-15 minutes, 3-4 times a day or as told by your health care provider. You can do this in the bathroom while a hot shower is running.  Limit your exposure to cool or dry air. Rest  Rest as much as possible.  Sleep with your head raised (elevated).  Make sure to get enough sleep each night. General instructions  Apply a warm, moist washcloth to your face 3-4 times a day or as told by your health care provider. This will help with discomfort.  Wash your hands often with soap and water to reduce your exposure to viruses and other germs. If soap and water are not available, use hand sanitizer.  Do not smoke. Avoid being around people who are smoking (secondhand  smoke).  Keep all follow-up visits as told by your health care provider. This is important. Contact a health care provider if:  You have a fever.  Your symptoms get worse.  Your symptoms do not improve within 10 days. Get help right away if:  You have a severe headache.  You have persistent vomiting.  You have pain or swelling around your face or eyes.  You have vision problems.  You develop confusion.  Your neck is stiff.  You have trouble breathing. This information is not intended to replace advice given to you by your health care provider. Make sure you discuss any questions you have with your health care provider. Document Released: 07/11/2005 Document Revised: 03/06/2016 Document Reviewed: 05/06/2015 Elsevier Interactive Patient Education  [employer].

## 2017-08-17 NOTE — Progress Notes (Signed)
Careteam: Patient Care Team: Gildardo Cranker, DO as PCP - General (Internal Medicine) Phineas Inches, MD as Consulting Physician (Nephrology) Judeth Horn, MD as Consulting Physician (General Surgery)  Allergies  Allergen Reactions  . Glucophage [Metformin Hcl] Other (See Comments)    Renal failure  . Ace Inhibitors Swelling and Other (See Comments)    Angioedema  . Advicor [Niacin-Lovastatin Er] Other (See Comments)    Muscle aches  . Bystolic [Nebivolol Hcl] Other (See Comments)    Edema  . Erythromycin Diarrhea, Nausea And Vomiting and Other (See Comments)    *DERIVATIVES*  . Lipitor [Atorvastatin] Other (See Comments)    Muscle aches  . Lopid [Gemfibrozil] Other (See Comments)    Muscle aches  . Statins Other (See Comments)    Muscle aches  . Adhesive [Tape] Rash    Chief Complaint  Patient presents with  . Transitions Of Care    Pt is being seen to follow up on recent hosptial stay at [facility] 08/10/17 to 08/13/17 due to gastroenteritis. Pt reports no N&V since discharge. Pt does reports some sinus pressure/pain, headache, and dizziness for last 3 to 4 days.      HPI: Patient is a 67 y.o. female seen in the office today as hospital follow up. Pt with past medical history significant for asthma, heart failure, CKD, depression, diabetes went to ED with vomiting and weakness. 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 Patient had reversal of her colostomy in December. Since had been having diarrhea with constipation which she was told would be expected.  In the hospital she was found to have AKI due to gastroenteritis thought to be due to viral etiology.  Eating and drinking normally since she has been home. Having ongoing diarrhea which is expected after reversal.  Dr Hulen Skains, surgery recommend fiber and imodium.    Reports she has been stopped up in her head and dizziness for 3 days with sinus congestion.  On Claritin and Singulair but not working.    Review of Systems:  Review of Systems  Constitutional: Positive for chills and malaise/fatigue. Negative for fever.  HENT: Positive for congestion. Negative for hearing loss, sinus pain and sore throat.        Sneezing, runny nose  Respiratory: Negative for cough and shortness of breath.   Cardiovascular: Negative for chest pain and leg swelling.  Gastrointestinal: Positive for diarrhea. Negative for abdominal pain, heartburn, nausea and vomiting.  Genitourinary: Negative for dysuria.  Neurological: Positive for dizziness and headaches.    Past Medical History:  Diagnosis Date  . AICD (automatic cardioverter/defibrillator) present    high RV threshold chronically, device was turned off in 2014; Device battery has been dead x 7 years  . Anemia, iron deficiency   . Angioedema    felt to likely be due to ace inhibitors but says she has had this even off of medicines, appears to be tolerating ARBs chronically  . Anxiety   . Arthritis    "hands, legs, arms" (07/13/2017)  . Asthmatic bronchitis with status asthmaticus   . Bradycardia   . CHF (congestive heart failure) ([place])   . Chronic lower back pain   . Chronic pain   . Chronic renal insufficiency   . CKD (chronic kidney disease), stage III ([place])   . Coronary artery disease    Mild nonobstructive (30% LAD, 30% RCA) by 09/01/16 cath at [facility]  . Degenerative joint disease   .  Depression   . Diabetes mellitus, type 2 ([place])   . Diabetic peripheral neuropathy ([place])   . Dizziness   . Dyspnea on exertion   . Family history of adverse reaction to anesthesia    Mother has nausea  . GERD (gastroesophageal reflux disease)   . Gout   . Heart valve disorder   . History of blood transfusion 1981; ~ 2005; 12/2016   "childbirth; defibrillator OR; colostomy OR"  . History of mononucleosis 03/2014  . Hyperlipidemia   . Hypertension   . Hypothyroid   . Insomnia   . Intervertebral  disc degeneration   . Ischemic cardiomyopathy   . LBBB (left bundle branch block)   . Myocardial infarction ([place]) dx'd ~ 2005  . Nonischemic cardiomyopathy ([place])    s/p ICD in 2005. EF has since recovered  . Obesity   . On home oxygen therapy    "2L at night" (07/13/2017)  . OSA on CPAP   . Pneumonia    "couple times; last time was 12/2016" (07/13/2017)  . PONV (postoperative nausea and vomiting)   . Swelling   . Syncope   . Systemic hypertension   . Vitamin D deficiency    Past Surgical History:  Procedure Laterality Date  . APPENDECTOMY  07/13/2017  . BLADDER SUSPENSION  1990   "w/hysterectomy"  . CARDIAC CATHETERIZATION  2005   no obstructive CAD per patient  . CARDIAC CATHETERIZATION  2018  . CARDIAC DEFIBRILLATOR PLACEMENT  2005   BiV ICD implanted,  LV lead is an epicardial lead  . CARPAL TUNNEL RELEASE Left 07/25/2014   Dr.Williamson   . CATARACT EXTRACTION W/ INTRAOCULAR LENS  IMPLANT, BILATERAL Bilateral   . COLONOSCOPY     2010-2011 Dr.Kipreos   . COLOSTOMY  12/2016   Archie Endo 01/26/2017  . COLOSTOMY REVERSAL  07/13/2017  . COLOSTOMY REVERSAL N/A 07/13/2017   Procedure: COLOSTOMY REVERSAL;  Surgeon: Judeth Horn, MD;  Location: [facility];  Service: General;  Laterality: N/A;  . CYST REMOVAL HAND Right 05/2013   thumb  . DILATION AND CURETTAGE OF UTERUS  X 5-6  . IMPLANTABLE CARDIOVERTER DEFIBRILLATOR GENERATOR CHANGE  2008  . TOTAL ABDOMINAL HYSTERECTOMY  1990  . TRIGGER FINGER RELEASE Right 05/213  . TUBAL LIGATION  1980s  . VESICOVAGINAL FISTULA CLOSURE W/ TAH     Social History:   reports that  has never smoked. she has never used smokeless tobacco. She reports that she does not drink alcohol or use drugs.  Family History  Problem Relation Age of Onset  . Hypertension Father   . Heart disease Father   . Cancer Father        prostate  . Heart disease Other        (Maternal side) Ischemic heart disease  . Diabetes Mellitus II Other   . Arrhythmia Mother     . Diabetes Mellitus II Mother        Borderline DM  . Hypertension Mother   . Asthma Mother   . Cancer Brother   . Dementia Brother   . Hypertension Brother   . Hypertension Daughter   . Hypertension Daughter   . Diabetes Daughter   . Hypertension Daughter   . Atrial fibrillation Daughter   . GER disease Daughter   . Hypertension Son   . Anxiety disorder Son   . Hypothyroidism Son     Medications: Patient's Medications  New Prescriptions   No medications on file  Previous Medications   ACETAMINOPHEN (TYLENOL)  500 MG TABLET    Take 1,000 mg by mouth every 6 (six) hours as needed for moderate pain or headache.   CARVEDILOL (COREG) 12.5 MG TABLET    Take 12.5 mg by mouth 2 (two) times daily with a meal.   CRESTOR 10 MG TABLET    Take 1 tablet (10 mg total) by mouth at bedtime.   ESCITALOPRAM (LEXAPRO) 10 MG TABLET    Take 10 mg by mouth at bedtime.   FERROUS SULFATE 325 (65 FE) MG EC TABLET    Take 325 mg by mouth daily.    GABAPENTIN (NEURONTIN) 100 MG CAPSULE    Take 100-800 mg by mouth See admin instructions. Take 100 mg by mouth in the morning and take 800 mg by mouth at bedtime   INSULIN GLARGINE (TOUJEO SOLOSTAR) 300 UNIT/ML SOPN    Inject 50 Units into the skin 2 (two) times daily.   INSULIN GLULISINE (APIDRA) 100 UNIT/ML SOLOSTAR PEN    Inject 20 Units into the skin 3 (three) times daily.   LEVOTHYROXINE (SYNTHROID, LEVOTHROID) 88 MCG TABLET    Take 88 mcg by mouth daily.    LORATADINE (CLARITIN) 10 MG TABLET    Take 10 mg by mouth daily.   MONTELUKAST (SINGULAIR) 10 MG TABLET    Take 10 mg by mouth at bedtime.   OMEGA-3 ACID ETHYL ESTERS (LOVAZA) 1 G CAPSULE    Take 4 g by mouth at bedtime.    ONDANSETRON (ZOFRAN-ODT) 4 MG DISINTEGRATING TABLET    Take 4 mg by mouth every 6 (six) hours as needed.   TELMISARTAN (MICARDIS) 20 MG TABLET    TAKE 1 TABLET BY MOUTH DAILY   TEMAZEPAM (RESTORIL) 15 MG CAPSULE    Take 1 capsule (15 mg total) by mouth at bedtime as needed for  sleep.   TRAMADOL (ULTRAM) 50 MG TABLET    Take 2 tablets (100 mg total) by mouth 2 (two) times daily.   ULORIC 40 MG TABLET    Take 40 mg by mouth daily.   Modified Medications   No medications on file  Discontinued Medications   No medications on file     Physical Exam:  Vitals:   08/17/17 1524  BP: 120/78  Pulse: 71  Temp: 98.4 F (36.9 C)  TempSrc: Oral  SpO2: 96%  Weight: 201 lb (91.2 kg)  Height: 5\' 6"  (1.676 m)   Body mass index is 32.44 kg/m.  Physical Exam  Constitutional: She is oriented to person, place, and time. She appears well-developed and well-nourished. No distress.  HENT:  Head: Normocephalic and atraumatic.  Right Ear: External ear normal.  Left Ear: External ear normal.  Nose: Mucosal edema and rhinorrhea present.  Mouth/Throat: Posterior oropharyngeal erythema (no exudate or swelling) present. No oropharyngeal exudate.  Eyes: Conjunctivae are normal. Pupils are equal, round, and reactive to light.  Neck: Normal range of motion. Neck supple.  Cardiovascular: Normal rate, regular rhythm and normal heart sounds.  Pulmonary/Chest: Effort normal and breath sounds normal.  Abdominal: Soft. Bowel sounds are normal. There is no tenderness.  Musculoskeletal: She exhibits no edema or tenderness.  Neurological: She is alert and oriented to person, place, and time.  Skin: Skin is warm and dry. She is not diaphoretic.  Psychiatric: She has a normal mood and affect.    Labs reviewed: Basic Metabolic Panel: Recent Labs    01/29/17 1603  01/31/17 6433 02/01/17 0426  03/15/17 1226  08/11/17 0301 08/12/17 0326 08/13/17 0910  NA  141   < > 135 135   < > 136   < > 137 138 136  K 4.5   < > 4.2 4.4   < > 4.3   < > 4.5 4.4 4.6  CL 98*   < > 95* 99*   < > 99   < > 105 108 101  CO2 27   < > 28 26   < > 25   < > 20* 21* 23  GLUCOSE 162*   < > 130* 189*   < > 126*   < > 136* 105* 158*  BUN 121*   < > 103* 67*   < > 19   < > 67* 47* 29*  CREATININE 2.96*   < >  1.96* 1.53*   < > 1.32*   < > 5.26* 2.71* 1.67*  CALCIUM 9.6   < > 9.2 9.2   < > 9.4   < > 7.8* 8.6* 9.3  MG 2.5*  --  2.7* 2.4  --   --   --   --   --   --   PHOS 4.7*  --  4.5 3.4  --   --   --   --   --   --   TSH  --   --   --   --   --  3.30  --   --   --   --    < > = values in this interval not displayed.   Liver Function Tests: Recent Labs    02/07/17 0622 07/05/17 1045 08/10/17 1841  AST 27 33 17  ALT 37 38 12*  ALKPHOS 187* 101 91  BILITOT 0.6 0.7 0.6  PROT 7.0 7.4 7.4  ALBUMIN 3.2* 3.4* 3.6   Recent Labs    01/26/17 1400  LIPASE 37  AMYLASE 20*   No results for input(s): AMMONIA in the last 8760 hours. CBC: Recent Labs    03/15/17 1226 07/05/17 1045  08/10/17 1841 08/11/17 0301 08/12/17 0326  WBC 7.9 7.2   < > 14.2* 8.8 6.8  NEUTROABS 4,266 3.7  --  10.3*  --   --   HGB 10.1* 12.4   < > 11.7* 10.1* 10.2*  HCT 32.1* 38.1   < > 36.9 32.7* 32.2*  MCV 85.6 81.2   < > 84.6 85.2 83.6  PLT 278 197   < > 309 222 206   < > = values in this interval not displayed.   Lipid Panel: Recent Labs    01/26/17 1341 01/27/17 0505 04/26/17 1639  CHOL  --   --  176  HDL  --   --  36*  TRIG 691* 515* 263*  CHOLHDL  --   --  4.9   TSH: Recent Labs    03/15/17 1226  TSH 3.30   A1C: Lab Results  Component Value Date   HGBA1C 8.4 (H) 07/05/2017     Assessment/Plan 1. Gastroenteritis -resolved.  - BASIC METABOLIC PANEL WITH GFR  2. AKI (acute kidney injury) ([place]) -had improved on discharge. To cont to stay hydrated  - BASIC METABOLIC PANEL WITH GFR  3. Acute non-recurrent sinusitis, unspecified location -supportive care at this time. -neti pot twice daily Plain nasal saline spray throughout the day as needed May use tylenol 325 mg 2 tablets every 6 hours as needed aches and pains or sore throat humidifier in the home to help with the dry air Keep well hydrated Mucinex  DM by mouth twice daily as needed for cough and congestion with full glass of water   - fluticasone (FLONASE) 50 MCG/ACT nasal spray; Place 1 spray into both nostrils 2 (two) times daily.  Dispense: 16 g; Refill: 1  4. Diarrhea, unspecified type -ongoing and stable, cont recs by surgery.   Next appt: as scheduled 08/30/2017  Carlos American. Harle Battiest  [facility] [phone] 8 am - 5 pm) [phone] (after hours)

## 2017-08-18 LAB — BASIC METABOLIC PANEL WITH GFR
BUN/Creatinine Ratio: 20 (calc) (ref 6–22)
BUN: 62 mg/dL — AB (ref 7–25)
CHLORIDE: 106 mmol/L (ref 98–110)
CO2: 21 mmol/L (ref 20–32)
CREATININE: 3.1 mg/dL — AB (ref 0.50–0.99)
Calcium: 9.4 mg/dL (ref 8.6–10.4)
GFR, Est African American: 17 mL/min/{1.73_m2} — ABNORMAL LOW (ref >=60)
GFR, Est Non African American: 15 mL/min/{1.73_m2} — ABNORMAL LOW (ref >=60)
GLUCOSE: 125 mg/dL (ref 65–139)
Potassium: 5 mmol/L (ref 3.5–5.3)
Sodium: 136 mmol/L (ref 135–146)

## 2017-08-21 ENCOUNTER — Ambulatory Visit: Payer: Medicare Other | Admitting: Nurse Practitioner

## 2017-08-22 ENCOUNTER — Ambulatory Visit (INDEPENDENT_AMBULATORY_CARE_PROVIDER_SITE_OTHER): Payer: Medicare Other | Admitting: Nurse Practitioner

## 2017-08-22 ENCOUNTER — Encounter: Payer: Self-pay | Admitting: Nurse Practitioner

## 2017-08-22 ENCOUNTER — Emergency Department (HOSPITAL_COMMUNITY): Payer: Medicare Other

## 2017-08-22 ENCOUNTER — Inpatient Hospital Stay (HOSPITAL_COMMUNITY)
Admission: EM | Admit: 2017-08-22 | Discharge: 2017-09-04 | DRG: 388 | Disposition: A | Discharge status: Home / Self Care | Payer: Medicare Other | Attending: General Surgery | Admitting: General Surgery

## 2017-08-22 ENCOUNTER — Encounter (HOSPITAL_COMMUNITY): Payer: Self-pay

## 2017-08-22 ENCOUNTER — Other Ambulatory Visit: Payer: Self-pay

## 2017-08-22 VITALS — BP 114/68 | HR 73 | Temp 98.7°F | Ht 66.0 in | Wt 203.0 lb

## 2017-08-22 DIAGNOSIS — N179 Acute kidney failure, unspecified: Secondary | ICD-10-CM | POA: Diagnosis not present

## 2017-08-22 DIAGNOSIS — I509 Heart failure, unspecified: Secondary | ICD-10-CM | POA: Diagnosis present

## 2017-08-22 DIAGNOSIS — Z9841 Cataract extraction status, right eye: Secondary | ICD-10-CM

## 2017-08-22 DIAGNOSIS — M545 Low back pain: Secondary | ICD-10-CM | POA: Diagnosis present

## 2017-08-22 DIAGNOSIS — I2510 Atherosclerotic heart disease of native coronary artery without angina pectoris: Secondary | ICD-10-CM | POA: Diagnosis present

## 2017-08-22 DIAGNOSIS — F329 Major depressive disorder, single episode, unspecified: Secondary | ICD-10-CM | POA: Diagnosis present

## 2017-08-22 DIAGNOSIS — K56691 Other complete intestinal obstruction: Secondary | ICD-10-CM

## 2017-08-22 DIAGNOSIS — Z95810 Presence of automatic (implantable) cardiac defibrillator: Secondary | ICD-10-CM | POA: Diagnosis not present

## 2017-08-22 DIAGNOSIS — I130 Hypertensive heart and chronic kidney disease with heart failure and stage 1 through stage 4 chronic kidney disease, or unspecified chronic kidney disease: Secondary | ICD-10-CM | POA: Diagnosis present

## 2017-08-22 DIAGNOSIS — E785 Hyperlipidemia, unspecified: Secondary | ICD-10-CM | POA: Diagnosis present

## 2017-08-22 DIAGNOSIS — Z4682 Encounter for fitting and adjustment of non-vascular catheter: Secondary | ICD-10-CM | POA: Diagnosis not present

## 2017-08-22 DIAGNOSIS — Z91048 Other nonmedicinal substance allergy status: Secondary | ICD-10-CM | POA: Diagnosis not present

## 2017-08-22 DIAGNOSIS — M19042 Primary osteoarthritis, left hand: Secondary | ICD-10-CM | POA: Diagnosis present

## 2017-08-22 DIAGNOSIS — I10 Essential (primary) hypertension: Secondary | ICD-10-CM | POA: Diagnosis not present

## 2017-08-22 DIAGNOSIS — F419 Anxiety disorder, unspecified: Secondary | ICD-10-CM | POA: Diagnosis present

## 2017-08-22 DIAGNOSIS — R112 Nausea with vomiting, unspecified: Secondary | ICD-10-CM | POA: Diagnosis not present

## 2017-08-22 DIAGNOSIS — I129 Hypertensive chronic kidney disease with stage 1 through stage 4 chronic kidney disease, or unspecified chronic kidney disease: Secondary | ICD-10-CM | POA: Diagnosis not present

## 2017-08-22 DIAGNOSIS — Z8701 Personal history of pneumonia (recurrent): Secondary | ICD-10-CM

## 2017-08-22 DIAGNOSIS — M19041 Primary osteoarthritis, right hand: Secondary | ICD-10-CM | POA: Diagnosis present

## 2017-08-22 DIAGNOSIS — N183 Chronic kidney disease, stage 3 (moderate): Secondary | ICD-10-CM | POA: Diagnosis present

## 2017-08-22 DIAGNOSIS — E860 Dehydration: Secondary | ICD-10-CM | POA: Diagnosis not present

## 2017-08-22 DIAGNOSIS — Z887 Allergy status to serum and vaccine status: Secondary | ICD-10-CM

## 2017-08-22 DIAGNOSIS — Z794 Long term (current) use of insulin: Secondary | ICD-10-CM

## 2017-08-22 DIAGNOSIS — K624 Stenosis of anus and rectum: Secondary | ICD-10-CM | POA: Diagnosis present

## 2017-08-22 DIAGNOSIS — Z9049 Acquired absence of other specified parts of digestive tract: Secondary | ICD-10-CM

## 2017-08-22 DIAGNOSIS — K56699 Other intestinal obstruction unspecified as to partial versus complete obstruction: Secondary | ICD-10-CM | POA: Diagnosis not present

## 2017-08-22 DIAGNOSIS — R933 Abnormal findings on diagnostic imaging of other parts of digestive tract: Secondary | ICD-10-CM | POA: Diagnosis not present

## 2017-08-22 DIAGNOSIS — M109 Gout, unspecified: Secondary | ICD-10-CM | POA: Diagnosis not present

## 2017-08-22 DIAGNOSIS — Z9981 Dependence on supplemental oxygen: Secondary | ICD-10-CM

## 2017-08-22 DIAGNOSIS — Z8042 Family history of malignant neoplasm of prostate: Secondary | ICD-10-CM

## 2017-08-22 DIAGNOSIS — Z881 Allergy status to other antibiotic agents status: Secondary | ICD-10-CM

## 2017-08-22 DIAGNOSIS — E1142 Type 2 diabetes mellitus with diabetic polyneuropathy: Secondary | ICD-10-CM | POA: Diagnosis present

## 2017-08-22 DIAGNOSIS — Z79899 Other long term (current) drug therapy: Secondary | ICD-10-CM

## 2017-08-22 DIAGNOSIS — Z8249 Family history of ischemic heart disease and other diseases of the circulatory system: Secondary | ICD-10-CM

## 2017-08-22 DIAGNOSIS — R571 Hypovolemic shock: Secondary | ICD-10-CM | POA: Diagnosis present

## 2017-08-22 DIAGNOSIS — K9131 Postprocedural partial intestinal obstruction: Principal | ICD-10-CM | POA: Diagnosis present

## 2017-08-22 DIAGNOSIS — I959 Hypotension, unspecified: Secondary | ICD-10-CM

## 2017-08-22 DIAGNOSIS — N189 Chronic kidney disease, unspecified: Secondary | ICD-10-CM | POA: Diagnosis not present

## 2017-08-22 DIAGNOSIS — Z90710 Acquired absence of both cervix and uterus: Secondary | ICD-10-CM

## 2017-08-22 DIAGNOSIS — Z452 Encounter for adjustment and management of vascular access device: Secondary | ICD-10-CM | POA: Diagnosis not present

## 2017-08-22 DIAGNOSIS — K219 Gastro-esophageal reflux disease without esophagitis: Secondary | ICD-10-CM | POA: Diagnosis present

## 2017-08-22 DIAGNOSIS — Z79890 Hormone replacement therapy (postmenopausal): Secondary | ICD-10-CM

## 2017-08-22 DIAGNOSIS — R011 Cardiac murmur, unspecified: Secondary | ICD-10-CM | POA: Diagnosis present

## 2017-08-22 DIAGNOSIS — G4733 Obstructive sleep apnea (adult) (pediatric): Secondary | ICD-10-CM | POA: Diagnosis present

## 2017-08-22 DIAGNOSIS — I447 Left bundle-branch block, unspecified: Secondary | ICD-10-CM | POA: Diagnosis present

## 2017-08-22 DIAGNOSIS — I252 Old myocardial infarction: Secondary | ICD-10-CM

## 2017-08-22 DIAGNOSIS — A419 Sepsis, unspecified organism: Secondary | ICD-10-CM | POA: Diagnosis not present

## 2017-08-22 DIAGNOSIS — Y832 Surgical operation with anastomosis, bypass or graft as the cause of abnormal reaction of the patient, or of later complication, without mention of misadventure at the time of the procedure: Secondary | ICD-10-CM | POA: Diagnosis present

## 2017-08-22 DIAGNOSIS — Z79891 Long term (current) use of opiate analgesic: Secondary | ICD-10-CM

## 2017-08-22 DIAGNOSIS — Z818 Family history of other mental and behavioral disorders: Secondary | ICD-10-CM

## 2017-08-22 DIAGNOSIS — K56609 Unspecified intestinal obstruction, unspecified as to partial versus complete obstruction: Secondary | ICD-10-CM | POA: Diagnosis present

## 2017-08-22 DIAGNOSIS — Z961 Presence of intraocular lens: Secondary | ICD-10-CM | POA: Diagnosis present

## 2017-08-22 DIAGNOSIS — N3289 Other specified disorders of bladder: Secondary | ICD-10-CM | POA: Diagnosis not present

## 2017-08-22 DIAGNOSIS — E1122 Type 2 diabetes mellitus with diabetic chronic kidney disease: Secondary | ICD-10-CM | POA: Diagnosis present

## 2017-08-22 DIAGNOSIS — Z825 Family history of asthma and other chronic lower respiratory diseases: Secondary | ICD-10-CM

## 2017-08-22 DIAGNOSIS — K5669 Other partial intestinal obstruction: Secondary | ICD-10-CM | POA: Diagnosis not present

## 2017-08-22 DIAGNOSIS — G8929 Other chronic pain: Secondary | ICD-10-CM | POA: Diagnosis present

## 2017-08-22 DIAGNOSIS — Z833 Family history of diabetes mellitus: Secondary | ICD-10-CM

## 2017-08-22 DIAGNOSIS — E039 Hypothyroidism, unspecified: Secondary | ICD-10-CM | POA: Diagnosis present

## 2017-08-22 DIAGNOSIS — E875 Hyperkalemia: Secondary | ICD-10-CM | POA: Insufficient documentation

## 2017-08-22 DIAGNOSIS — Z0189 Encounter for other specified special examinations: Secondary | ICD-10-CM

## 2017-08-22 DIAGNOSIS — E559 Vitamin D deficiency, unspecified: Secondary | ICD-10-CM | POA: Diagnosis present

## 2017-08-22 DIAGNOSIS — J45902 Unspecified asthma with status asthmaticus: Secondary | ICD-10-CM | POA: Diagnosis present

## 2017-08-22 DIAGNOSIS — G4700 Insomnia, unspecified: Secondary | ICD-10-CM | POA: Diagnosis present

## 2017-08-22 DIAGNOSIS — D509 Iron deficiency anemia, unspecified: Secondary | ICD-10-CM | POA: Diagnosis present

## 2017-08-22 DIAGNOSIS — E669 Obesity, unspecified: Secondary | ICD-10-CM | POA: Diagnosis present

## 2017-08-22 DIAGNOSIS — Z9842 Cataract extraction status, left eye: Secondary | ICD-10-CM

## 2017-08-22 DIAGNOSIS — R140 Abdominal distension (gaseous): Secondary | ICD-10-CM | POA: Diagnosis not present

## 2017-08-22 DIAGNOSIS — I255 Ischemic cardiomyopathy: Secondary | ICD-10-CM | POA: Diagnosis present

## 2017-08-22 DIAGNOSIS — Z6835 Body mass index (BMI) 35.0-35.9, adult: Secondary | ICD-10-CM

## 2017-08-22 LAB — CBC
HEMATOCRIT: 37.4 % (ref 36.0–46.0)
HEMOGLOBIN: 12 g/dL (ref 12.0–15.0)
MCH: 27.3 pg (ref 26.0–34.0)
MCHC: 32.1 g/dL (ref 30.0–36.0)
MCV: 85.2 fL (ref 78.0–100.0)
Platelets: 295 10*3/uL (ref 150–400)
RBC: 4.39 MIL/uL (ref 3.87–5.11)
RDW: 15.5 % (ref 11.5–15.5)
WBC: 9.7 10*3/uL (ref 4.0–10.5)

## 2017-08-22 LAB — I-STAT CHEM 8, ED
BUN: 72 mg/dL — ABNORMAL HIGH (ref 6–20)
Calcium, Ion: 1.15 mmol/L (ref 1.15–1.40)
Chloride: 98 mmol/L — ABNORMAL LOW (ref 101–111)
Creatinine, Ser: 4.4 mg/dL — ABNORMAL HIGH (ref 0.44–1.00)
GLUCOSE: 96 mg/dL (ref 65–99)
HEMATOCRIT: 36 % (ref 36.0–46.0)
HEMOGLOBIN: 12.2 g/dL (ref 12.0–15.0)
POTASSIUM: 6.8 mmol/L — AB (ref 3.5–5.1)
SODIUM: 133 mmol/L — AB (ref 135–145)
TCO2: 29 mmol/L (ref 22–32)

## 2017-08-22 LAB — URINALYSIS, ROUTINE W REFLEX MICROSCOPIC
Glucose, UA: NEGATIVE mg/dL
Hgb urine dipstick: NEGATIVE
Ketones, ur: NEGATIVE mg/dL
LEUKOCYTES UA: NEGATIVE
NITRITE: NEGATIVE
PH: 5 (ref 5.0–8.0)
Protein, ur: NEGATIVE mg/dL
SPECIFIC GRAVITY, URINE: 1.019 (ref 1.005–1.030)

## 2017-08-22 LAB — BASIC METABOLIC PANEL
Anion gap: 9 (ref 5–15)
BUN: 66 mg/dL — AB (ref 6–20)
CHLORIDE: 104 mmol/L (ref 101–111)
CO2: 23 mmol/L (ref 22–32)
CREATININE: 3.36 mg/dL — AB (ref 0.44–1.00)
Calcium: 8.4 mg/dL — ABNORMAL LOW (ref 8.9–10.3)
GFR, EST AFRICAN AMERICAN: 15 mL/min — AB (ref >60)
GFR, EST NON AFRICAN AMERICAN: 13 mL/min — AB (ref >60)
Glucose, Bld: 76 mg/dL (ref 65–99)
POTASSIUM: 5.8 mmol/L — AB (ref 3.5–5.1)
SODIUM: 136 mmol/L (ref 135–145)

## 2017-08-22 LAB — I-STAT CG4 LACTIC ACID, ED
LACTIC ACID, VENOUS: 1.67 mmol/L (ref 0.5–1.9)
LACTIC ACID, VENOUS: 0.77 mmol/L (ref 0.5–1.9)

## 2017-08-22 LAB — COMPREHENSIVE METABOLIC PANEL
ALK PHOS: 90 U/L (ref 38–126)
ALT: 15 U/L (ref 14–54)
ANION GAP: 12 (ref 5–15)
AST: 18 U/L (ref 15–41)
Albumin: 3.7 g/dL (ref 3.5–5.0)
BILIRUBIN TOTAL: 0.9 mg/dL (ref 0.3–1.2)
BUN: 73 mg/dL — ABNORMAL HIGH (ref 6–20)
CALCIUM: 9.5 mg/dL (ref 8.9–10.3)
CO2: 25 mmol/L (ref 22–32)
Chloride: 96 mmol/L — ABNORMAL LOW (ref 101–111)
Creatinine, Ser: 4.08 mg/dL — ABNORMAL HIGH (ref 0.44–1.00)
GFR calc non Af Amer: 10 mL/min — ABNORMAL LOW (ref >60)
GFR, EST AFRICAN AMERICAN: 12 mL/min — AB (ref >60)
Glucose, Bld: 99 mg/dL (ref 65–99)
POTASSIUM: 6.9 mmol/L — AB (ref 3.5–5.1)
SODIUM: 133 mmol/L — AB (ref 135–145)
TOTAL PROTEIN: 7.6 g/dL (ref 6.5–8.1)

## 2017-08-22 LAB — CBG MONITORING, ED: GLUCOSE-CAPILLARY: 96 mg/dL (ref 65–99)

## 2017-08-22 LAB — LIPASE, BLOOD: Lipase: 37 U/L (ref 11–51)

## 2017-08-22 MED ORDER — VANCOMYCIN HCL IN DEXTROSE 1-5 GM/200ML-% IV SOLN: 1000.0000 mg | INTRAVENOUS | Status: DC

## 2017-08-22 MED ORDER — SODIUM CHLORIDE 0.9 % IV BOLUS (SEPSIS)
1000.0000 mL | Freq: Once | INTRAVENOUS | Status: AC
  Administered 2017-08-22: 1000 mL via INTRAVENOUS

## 2017-08-22 MED ORDER — SODIUM CHLORIDE 0.9 % IV SOLN
1.0000 g | Freq: Once | INTRAVENOUS | Status: AC
  Administered 2017-08-22: 1 g via INTRAVENOUS
  Filled 2017-08-22: qty 10

## 2017-08-22 MED ORDER — PIPERACILLIN-TAZOBACTAM 3.375 G IVPB 30 MIN
3.3750 g | Freq: Once | INTRAVENOUS | Status: AC
  Administered 2017-08-22: 3.375 g via INTRAVENOUS

## 2017-08-22 MED ORDER — DEXTROSE 5 % IV SOLN
0.0000 ug/min | Freq: Once | INTRAVENOUS | Status: AC
  Administered 2017-08-22: 2 ug/min via INTRAVENOUS
  Filled 2017-08-22: qty 4

## 2017-08-22 MED ORDER — STERILE WATER FOR INJECTION IV SOLN
INTRAVENOUS | Status: DC
  Administered 2017-08-22 – 2017-08-23 (×4): via INTRAVENOUS
  Filled 2017-08-22 (×6): qty 850

## 2017-08-22 MED ORDER — INSULIN ASPART 100 UNIT/ML ~~LOC~~ SOLN
5.0000 [IU] | Freq: Once | SUBCUTANEOUS | Status: AC
  Administered 2017-08-22: 5 [IU] via INTRAVENOUS
  Filled 2017-08-22: qty 1

## 2017-08-22 MED ORDER — SODIUM CHLORIDE 0.9 % IV SOLN
INTRAVENOUS | Status: DC
  Administered 2017-08-22: 21:00:00 via INTRAVENOUS
  Filled 2017-08-22 (×2): qty 1000

## 2017-08-22 MED ORDER — ONDANSETRON 4 MG PO TBDP: 4.0000 mg | ORAL_TABLET | Freq: Four times a day (QID) | ORAL | Status: DC | PRN

## 2017-08-22 MED ORDER — SODIUM CHLORIDE 0.9 % IV BOLUS (SEPSIS)
1000.0000 mL | Freq: Once | INTRAVENOUS | Status: AC
Start: 2017-08-22 — End: 2017-08-22
  Administered 2017-08-22: 1000 mL via INTRAVENOUS

## 2017-08-22 MED ORDER — DEXTROSE 50 % IV SOLN
1.0000 | Freq: Once | INTRAVENOUS | Status: AC
  Administered 2017-08-22: 50 mL via INTRAVENOUS
  Filled 2017-08-22: qty 50

## 2017-08-22 MED ORDER — IOPAMIDOL (ISOVUE-300) INJECTION 61%
INTRAVENOUS | Status: AC
  Filled 2017-08-22: qty 30

## 2017-08-22 MED ORDER — ALBUTEROL (5 MG/ML) CONTINUOUS INHALATION SOLN: 10.0000 mg/h | INHALATION_SOLUTION | RESPIRATORY_TRACT | Status: DC

## 2017-08-22 MED ORDER — ONDANSETRON HCL 4 MG/2ML IJ SOLN: 4.0000 mg | Freq: Four times a day (QID) | INTRAMUSCULAR | Status: DC | PRN

## 2017-08-22 MED ORDER — VANCOMYCIN HCL 10 G IV SOLR
2000.0000 mg | Freq: Once | INTRAVENOUS | Status: DC
  Filled 2017-08-22: qty 2000

## 2017-08-22 MED ORDER — ALBUTEROL SULFATE (2.5 MG/3ML) 0.083% IN NEBU
10.0000 mg | INHALATION_SOLUTION | Freq: Once | RESPIRATORY_TRACT | Status: AC
  Administered 2017-08-22: 10 mg via RESPIRATORY_TRACT
  Filled 2017-08-22: qty 12

## 2017-08-22 MED ORDER — PIPERACILLIN-TAZOBACTAM IN DEX 2-0.25 GM/50ML IV SOLN
2.2500 g | Freq: Three times a day (TID) | INTRAVENOUS | Status: DC
  Administered 2017-08-22 – 2017-08-23 (×4): 2.25 g via INTRAVENOUS
  Filled 2017-08-22 (×5): qty 50

## 2017-08-22 NOTE — Progress Notes (Signed)
Pharmacy Antibiotic Note  Crystal [name] is a 67 y.o. female admitted on 08/22/2017 with sepsis.  PMH includes CKD, diabetes, and asthma.Recent colostomy on 12/10 and admission on 1/17 with AKI,and  N/V/D. Pharmacy has been consulted for vancomycin and Zosyn dosing.  Plan: -vancomycin 2 gram load x 1 -vancomycin 1 gram every 48 hours -goal trough 15-20 mcg/mL  -Zosyn 3.375g 30 min infusion x 1 -Zosyn 2.25g 4 hour infusion every 8 hours  -monitor clinical progression, renal function, and trough as needed  Height: 5\' 6"  (167.6 cm) Weight: 203 lb (92.1 kg) IBW/kg (Calculated) : 59.3  Temp (24hrs), Avg:98.8 F (37.1 C), Min:98.3 F (36.8 C), Max:99.5 F (37.5 C)  Recent Labs  Lab 08/17/17 1545 08/22/17 1053 08/22/17 1140 08/22/17 1141  WBC  --  9.7  --   --   CREATININE 3.10* 4.08* 4.40*  --   LATICACIDVEN  --   --   --  1.67    Estimated Creatinine Clearance: 14.2 mL/min (A) (by C-G formula based on SCr of 4.4 mg/dL (H)).    Allergies  Allergen Reactions  . Glucophage [Metformin Hcl] Other (See Comments)    Renal failure  . Ace Inhibitors Swelling and Other (See Comments)    Angioedema  . Advicor [Niacin-Lovastatin Er] Other (See Comments)    Muscle aches  . Bystolic [Nebivolol Hcl] Other (See Comments)    Edema  . Erythromycin Diarrhea, Nausea And Vomiting and Other (See Comments)    *DERIVATIVES*  . Lipitor [Atorvastatin] Other (See Comments)    Muscle aches  . Lopid [Gemfibrozil] Other (See Comments)    Muscle aches  . Statins Other (See Comments)    Muscle aches  . Adhesive [Tape] Rash    Antimicrobials this admission: 1/29 vancomycin >>  1/29 Zosyn >>   Dose adjustments this admission: N/A  Microbiology results: 1/29 BCx: x 2 pending 1/29 UCx: pending  Thank you for allowing pharmacy to be a part of this patient's care.  [name] [name] 08/22/2017 12:33 PM

## 2017-08-22 NOTE — ED Notes (Signed)
Patient resting , respirations unlabored , denies pain , alert and oriented , NGT and foley catheter intact , IV sites unremarkable , Levophed drip and Sodium Bicarb drip infusing . No emesis .

## 2017-08-22 NOTE — ED Notes (Signed)
Returned from CT.

## 2017-08-22 NOTE — ED Provider Notes (Signed)
[facility] EMERGENCY DEPARTMENT Provider Note   CSN: [id number] Arrival date & time: 08/22/17  1011     History   Chief Complaint Chief Complaint  Patient presents with  . Nausea  . Emesis    HPI Crystal [name] is a 67 y.o. female.  HPI   67 year old female with a history of diabetes, CHF, CKD, coronary artery disease, depression, reversal of colostomy December 20 with Dr. Hulen Skains, admission 1/17 for dehydration/AKI presents with concern for nausea, vomiting, generalized weakness.  Patient reports that she is finally getting her appetite back after her surgery, and had a larger meal 2 days ago, and then felt sick after that.  And thought initially was related to the food, however then continued to have vomiting over the last 2 days.  Reports she is vomited more times and she can count, approximately every half hour.  Denies bloody emesis, denies of.  Does report that it looks brown at times like a light brown color.  Reports she has not had a bowel movement for 4 days, however she has passed flatus.  She does note abdominal distention, and cramping right-sided abdominal pain that comes and goes.  It is located over the right lower quadrant and right mid abdomen.  Nothing seems to make that better or worse.  She is been unable to stop vomiting despite taking Zofran and Phenergan at home.  Denies fevers, dysuria, cough, chest pain or shortness of breath.  Reports she has had generalized weakness and some lightheadedness today.  Reports she has been urinating less.  Past Medical History:  Diagnosis Date  . AICD (automatic cardioverter/defibrillator) present    high RV threshold chronically, device was turned off in 2014; Device battery has been dead x 7 years  . Anemia, iron deficiency   . Angioedema    felt to likely be due to ace inhibitors but says she has had this even off of medicines, appears to be tolerating ARBs chronically  . Anxiety   . Arthritis    "hands,  legs, arms" (07/13/2017)  . Asthmatic bronchitis with status asthmaticus   . Bradycardia   . CHF (congestive heart failure) ([place])   . Chronic lower back pain   . Chronic pain   . Chronic renal insufficiency   . CKD (chronic kidney disease), stage III ([place])   . Coronary artery disease    Mild nonobstructive (30% LAD, 30% RCA) by 09/01/16 cath at [facility]  . Degenerative joint disease   . Depression   . Diabetes mellitus, type 2 ([place])   . Diabetic peripheral neuropathy ([place])   . Dizziness   . Dyspnea on exertion   . Family history of adverse reaction to anesthesia    Mother has nausea  . GERD (gastroesophageal reflux disease)   . Gout   . Heart valve disorder   . History of blood transfusion 1981; ~ 2005; 12/2016   "childbirth; defibrillator OR; colostomy OR"  . History of mononucleosis 03/2014  . Hyperlipidemia   . Hypertension   . Hypothyroid   . Insomnia   . Intervertebral disc degeneration   . Ischemic cardiomyopathy   . LBBB (left bundle branch block)   . Myocardial infarction ([place]) dx'd ~ 2005  . Nonischemic cardiomyopathy ([place])    s/p ICD in 2005. EF has since recovered  . Obesity   . On home oxygen therapy    "2L at night" (07/13/2017)  . OSA on CPAP   . Pneumonia    "  couple times; last time was 12/2016" (07/13/2017)  . PONV (postoperative nausea and vomiting)   . Swelling   . Syncope   . Systemic hypertension   . Vitamin D deficiency     Patient Active Problem List   Diagnosis Date Noted  . Dehydration   . Intractable vomiting with nausea   . Hyperkalemia   . Gastroenteritis 08/11/2017  . Hypotension 08/11/2017  . AKI (acute kidney injury) ([place]) 08/11/2017  . Hypovolemia 08/10/2017  . Colostomy in place [facility]) 07/13/2017  . Adjustment disorder with depressed mood   . Debilitated 02/06/2017  . S/P colostomy ([place]) 01/31/2017  . Pressure injury of skin 01/28/2017  . Colitis 01/26/2017  . Acute MI ([place]) 05/05/2014  . Anemia, iron deficiency  05/05/2014  . Airway hyperreactivity 05/05/2014  . Diabetes mellitus, type 2 ([place]) 05/05/2014  . Chronic systolic heart failure ([place]) 05/05/2014  . HLD (hyperlipidemia) 05/05/2014  . Disease of thyroid gland 05/05/2014  . Nonischemic cardiomyopathy ([place]) 02/02/2014  . LBBB (left bundle branch block) 02/02/2014  . Chronic systolic dysfunction of left ventricle 02/02/2014  . Chronic renal insufficiency 02/02/2014  . Essential hypertension 02/02/2014  . Morbid obesity ([place]) 02/02/2014  . History of prolonged Q-T interval on ECG 10/31/2012  . Automatic implantable cardioverter-defibrillator in situ 08/06/2008    Past Surgical History:  Procedure Laterality Date  . APPENDECTOMY  07/13/2017  . BLADDER SUSPENSION  1990   "w/hysterectomy"  . CARDIAC CATHETERIZATION  2005   no obstructive CAD per patient  . CARDIAC CATHETERIZATION  2018  . CARDIAC DEFIBRILLATOR PLACEMENT  2005   BiV ICD implanted,  LV lead is an epicardial lead  . CARPAL TUNNEL RELEASE Left 07/25/2014   Dr.Williamson   . CATARACT EXTRACTION W/ INTRAOCULAR LENS  IMPLANT, BILATERAL Bilateral   . COLONOSCOPY     2010-2011 Dr.Kipreos   . COLOSTOMY  12/2016   Archie Endo 01/26/2017  . COLOSTOMY REVERSAL  07/13/2017  . COLOSTOMY REVERSAL N/A 07/13/2017   Procedure: COLOSTOMY REVERSAL;  Surgeon: Judeth Horn, MD;  Location: [facility];  Service: General;  Laterality: N/A;  . CYST REMOVAL HAND Right 05/2013   thumb  . DILATION AND CURETTAGE OF UTERUS  X 5-6  . IMPLANTABLE CARDIOVERTER DEFIBRILLATOR GENERATOR CHANGE  2008  . TOTAL ABDOMINAL HYSTERECTOMY  1990  . TRIGGER FINGER RELEASE Right 05/213  . TUBAL LIGATION  1980s  . VESICOVAGINAL FISTULA CLOSURE W/ TAH      OB History    No data available       Home Medications    Prior to Admission medications   Medication Sig Start Date End Date Taking? Authorizing Provider  acetaminophen (TYLENOL) 500 MG tablet Take 1,000 mg by mouth every 6 (six) hours as needed for moderate  pain or headache.   Yes [provider]  carvedilol (COREG) 12.5 MG tablet Take 12.5 mg by mouth 2 (two) times daily with a meal.   Yes [provider]  CRESTOR 10 MG tablet Take 1 tablet (10 mg total) by mouth at bedtime. 02/15/17  Yes Love, Ivan Anchors, PA-C  escitalopram (LEXAPRO) 10 MG tablet Take 10 mg by mouth at bedtime.   Yes [provider]  ferrous sulfate 325 (65 FE) MG EC tablet Take 325 mg by mouth daily.    Yes [provider]  fluticasone (FLONASE) 50 MCG/ACT nasal spray SHAKE LIQUID AND USE 1 SPRAY IN EACH NOSTRIL TWICE DAILY 08/17/17  Yes Lauree Chandler, NP  gabapentin (NEURONTIN) 100 MG capsule Take  100-800 mg by mouth See admin instructions. Take 100 mg by mouth in the morning and take 800 mg by mouth at bedtime   Yes [provider]  Insulin Glargine (TOUJEO SOLOSTAR) 300 UNIT/ML SOPN Inject 50 Units into the skin 2 (two) times daily. 07/17/17  Yes Kinsinger, Arta Bruce, MD  Insulin Glulisine (APIDRA) 100 UNIT/ML Solostar Pen Inject 20 Units into the skin 3 (three) times daily.   Yes [provider]  levothyroxine (SYNTHROID, LEVOTHROID) 88 MCG tablet Take 88 mcg by mouth daily.  10/16/15  Yes [provider]  loratadine (CLARITIN) 10 MG tablet Take 10 mg by mouth daily.   Yes [provider]  Misc. Devices MISC by Does not apply route. C-pap   Yes [provider]  montelukast (SINGULAIR) 10 MG tablet Take 10 mg by mouth at bedtime.   Yes [provider]  omega-3 acid ethyl esters (LOVAZA) 1 g capsule Take 4 g by mouth at bedtime.    Yes [provider]  Omega-3 Fatty Acids (FISH OIL) 1000 MG CAPS Take 1,000 capsules by mouth daily.   Yes [provider]  ondansetron (ZOFRAN-ODT) 4 MG disintegrating tablet Take 4 mg by mouth every 6 (six) hours as needed. 07/20/17  Yes [provider]  OXYGEN Inhale 2 L into the lungs.   Yes [provider]  promethazine  (PHENERGAN) 25 MG tablet Take 25 mg by mouth every 6 (six) hours as needed for nausea or vomiting.   Yes [provider]  temazepam (RESTORIL) 15 MG capsule Take 1 capsule (15 mg total) by mouth at bedtime as needed for sleep. 07/20/17  Yes Eulas Post, Monica, DO  traMADol (ULTRAM) 50 MG tablet Take 2 tablets (100 mg total) by mouth 2 (two) times daily. 07/17/17  Yes Kinsinger, Arta Bruce, MD  ULORIC 40 MG tablet Take 40 mg by mouth daily.  01/29/14  Yes [provider]    Family History Family History  Problem Relation Age of Onset  . Hypertension Father   . Heart disease Father   . Cancer Father        prostate  . Heart disease Other        (Maternal side) Ischemic heart disease  . Diabetes Mellitus II Other   . Arrhythmia Mother   . Diabetes Mellitus II Mother        Borderline DM  . Hypertension Mother   . Asthma Mother   . Cancer Brother   . Dementia Brother   . Hypertension Brother   . Hypertension Daughter   . Hypertension Daughter   . Diabetes Daughter   . Hypertension Daughter   . Atrial fibrillation Daughter   . GER disease Daughter   . Hypertension Son   . Anxiety disorder Son   . Hypothyroidism Son     Social History Social History   Tobacco Use  . Smoking status: Never Smoker  . Smokeless tobacco: Never Used  Substance Use Topics  . Alcohol use: No  . Drug use: No     Allergies   Glucophage [metformin hcl]; Ace inhibitors; Advicor [niacin-lovastatin er]; Bystolic [nebivolol hcl]; Erythromycin; Lipitor [atorvastatin]; Lopid [gemfibrozil]; Statins; and Adhesive [tape]   Review of Systems Review of Systems  Constitutional: Positive for fatigue. Negative for fever.  HENT: Negative for sore throat.   Eyes: Negative for visual disturbance.  Respiratory: Negative for cough and shortness of breath.   Cardiovascular: Negative for chest pain.  Gastrointestinal: Positive for abdominal distention, abdominal pain,  constipation, nausea and  vomiting.  Genitourinary: Positive for decreased urine volume. Negative for difficulty urinating and dysuria.  Musculoskeletal: Negative for back pain and neck pain.  Skin: Negative for rash.  Neurological: Positive for light-headedness. Negative for syncope and headaches.     Physical Exam Updated Vital Signs BP (!) 96/44   Pulse 68   Temp 99.5 F (37.5 C) (Rectal)   Resp 13   Ht 5\' 6"  (1.676 m)   Wt 92.1 kg (203 lb)   SpO2 96%   BMI 32.77 kg/m   Physical Exam  Constitutional: She is oriented to person, place, and time. She appears well-developed and well-nourished. No distress.  HENT:  Head: Normocephalic and atraumatic.  Eyes: Conjunctivae and EOM are normal.  Neck: Normal range of motion.  Cardiovascular: Normal rate, regular rhythm, normal heart sounds and intact distal pulses. Exam reveals no gallop and no friction rub.  No murmur heard. Pulmonary/Chest: Effort normal and breath sounds normal. No respiratory distress. She has no wheezes. She has no rales.  Abdominal: Soft. She exhibits distension (tympanic). There is tenderness. There is no guarding.  Surgical scars  Musculoskeletal: She exhibits no edema or tenderness.  Neurological: She is alert and oriented to person, place, and time.  Skin: Skin is warm and dry. No rash noted. She is not diaphoretic. No erythema.  Nursing note and vitals reviewed.    ED Treatments / Results  Labs (all labs ordered are listed, but only abnormal results are displayed) Labs Reviewed  COMPREHENSIVE METABOLIC PANEL - Abnormal; Notable for the following components:      Result Value   Sodium 133 (*)    Potassium 6.9 (*)    Chloride 96 (*)    BUN 73 (*)    Creatinine, Ser 4.08 (*)    GFR calc non Af Amer 10 (*)    GFR calc Af Amer 12 (*)    All other components within normal limits  URINALYSIS, ROUTINE W REFLEX MICROSCOPIC - Abnormal; Notable for the following components:   APPearance HAZY (*)    Bilirubin Urine MODERATE (*)     All other components within normal limits  BASIC METABOLIC PANEL - Abnormal; Notable for the following components:   Potassium 5.8 (*)    BUN 66 (*)    Creatinine, Ser 3.36 (*)    Calcium 8.4 (*)    GFR calc non Af Amer 13 (*)    GFR calc Af Amer 15 (*)    All other components within normal limits  I-STAT CHEM 8, ED - Abnormal; Notable for the following components:   Sodium 133 (*)    Potassium 6.8 (*)    Chloride 98 (*)    BUN 72 (*)    Creatinine, Ser 4.40 (*)    All other components within normal limits  URINE CULTURE  CULTURE, BLOOD (ROUTINE X 2)  CULTURE, BLOOD (ROUTINE X 2)  LIPASE, BLOOD  CBC  APTT  BLOOD GAS, ARTERIAL  CBC WITH DIFFERENTIAL/PLATELET  COMPREHENSIVE METABOLIC PANEL  MAGNESIUM  PROTIME-INR  CBG MONITORING, ED  I-STAT CG4 LACTIC ACID, ED  I-STAT CG4 LACTIC ACID, ED    EKG  EKG Interpretation  Date/Time:  Tuesday August 22 2017 11:10:07 EST Ventricular Rate:  75 PR Interval:    QRS Duration: 168 QT Interval:  453 QTC Calculation: 506 R Axis:   -53 Text Interpretation:  Sinus rhythm Left bundle branch block No significant change since last tracing Confirmed by Gareth [name] [phone]) on 08/22/2017 11:21:07  AM       Radiology Ct Abdomen Pelvis Wo Contrast  Result Date: 08/22/2017 CLINICAL DATA:  Abdominal distension, history of recent colostomy reversal EXAM: CT ABDOMEN AND PELVIS WITHOUT CONTRAST TECHNIQUE: Multidetector CT imaging of the abdomen and pelvis was performed following the standard protocol without IV contrast. COMPARISON:  01/27/2017 FINDINGS: Lower chest: Left basilar atelectasis is noted. No sizable effusion is seen. Hepatobiliary: Liver is well visualized and within normal limits. Gallbladder is well distended with dependent small gallstones. Pancreas: Unremarkable. No pancreatic ductal dilatation or surrounding inflammatory changes. Spleen: Normal in size without focal abnormality. Adrenals/Urinary Tract: Bladder is partially  distended. Kidneys are well visualized bilaterally without renal calculi or urinary tract obstructive changes. The adrenals are unremarkable. Stomach/Bowel: There are changes consistent with the recent history of colostomy reversal. The residual colon is dilated with fluid and multiple dilated loops of small bowel are identified consistent with small bowel obstruction. This is likely related to narrowing at the site of anastomosis in the right lower quadrant of the transverse colon to the sigmoid colon. Vascular/Lymphatic: Aortic atherosclerosis. No enlarged abdominal or pelvic lymph nodes. Reproductive: Status post hysterectomy. No adnexal masses. Other: No abdominal wall hernia or abnormality. No abdominopelvic ascites. Musculoskeletal: Degenerative changes of lumbar spine are noted. IMPRESSION: Changes consistent with obstruction at the anastomotic site of the recent colon to colon anastomosis with colonic dilatation and small-bowel dilatation Chronic changes as described. Electronically Signed   By: Inez Catalina M.D.   On: 08/22/2017 13:10   Dg Chest Portable 1 View  Result Date: 08/22/2017 CLINICAL DATA:  67 year old female with a history of nausea and vomiting for 5 days EXAM: PORTABLE CHEST 1 VIEW COMPARISON:  08/11/2017 FINDINGS: Cardiomediastinal silhouette unchanged, with cardiomegaly. Cardiac pacing device/AICD on the left chest wall, unchanged. No confluent airspace disease, pneumothorax, or pleural effusion. IMPRESSION: No evidence of acute cardiopulmonary disease. Unchanged cardiac pacing device/AICD Electronically Signed   By: Corrie Mckusick D.O.   On: 08/22/2017 12:09    Procedures .Critical Care Performed by: Gareth [name], MD Authorized by: Gareth [name], MD   Critical care provider statement:    Critical care time (minutes):  60   Critical care was necessary to treat or prevent imminent or life-threatening deterioration of the following conditions:  Circulatory failure,  dehydration, renal failure and metabolic crisis   Critical care was time spent personally by me on the following activities:  Development of treatment plan with patient or surrogate, discussions with consultants, evaluation of patient's response to treatment, examination of patient, review of old charts, re-evaluation of patient's condition, ordering and review of radiographic studies, ordering and review of laboratory studies and ordering and performing treatments and interventions   (including critical care time)  Medications Ordered in ED Medications  iopamidol (ISOVUE-300) 61 % injection (not administered)  sodium chloride 0.9 % bolus 1,000 mL (not administered)  vancomycin (VANCOCIN) 2,000 mg in sodium chloride 0.9 % 500 mL IVPB (not administered)  vancomycin (VANCOCIN) IVPB 1000 mg/200 mL premix (not administered)  piperacillin-tazobactam (ZOSYN) IVPB 2.25 g (not administered)  sodium chloride 0.9 % 1,000 mL infusion (not administered)  sodium bicarbonate 150 mEq in sterile water 1,000 mL infusion (not administered)  sodium chloride 0.9 % bolus 1,000 mL (0 mLs Intravenous Stopped 08/22/17 1306)  sodium chloride 0.9 % bolus 1,000 mL (1,000 mLs Intravenous New Bag/Given 08/22/17 1204)  sodium chloride 0.9 % bolus 1,000 mL (0 mLs Intravenous Stopped 08/22/17 1212)  calcium gluconate 1 g in sodium chloride 0.9 %  100 mL IVPB (0 g Intravenous Stopped 08/22/17 1256)  insulin aspart (novoLOG) injection 5 Units (5 Units Intravenous Given 08/22/17 1206)  sodium chloride 0.9 % bolus 1,000 mL (1,000 mLs Intravenous New Bag/Given 08/22/17 1230)  norepinephrine (LEVOPHED) 4 mg in dextrose 5 % 250 mL (0.016 mg/mL) infusion (2 mcg/min Intravenous New Bag/Given 08/22/17 1219)  dextrose 50 % solution 50 mL (50 mLs Intravenous Given 08/22/17 1205)  piperacillin-tazobactam (ZOSYN) IVPB 3.375 g (0 g Intravenous Stopped 08/22/17 1305)  albuterol (PROVENTIL) (2.5 MG/3ML) 0.083% nebulizer solution 10 mg (10 mg  Nebulization Given 08/22/17 1605)     Initial Impression / Assessment and Plan / ED Course  I have reviewed the triage vital signs and the nursing notes.  Pertinent labs & imaging results that were available during my care of the patient were reviewed by me and considered in my medical decision making (see chart for details).     67 year old female with a history of diabetes, CHF, CKD, coronary artery disease, depression, reversal of colostomy December 20 with Dr. Hulen Skains, admission 1/17 for dehydration/AKI presents with concern for nausea, vomiting, generalized weakness.  Patient with hypotension with blood pressure in the 70s on arrival.  Exam concerning for small bowel obstruction, and initially suspected hypotension was secondary to hypovolemia from vomiting and dehydration.  Initiated 2 L of normal saline, and ordered labs and CT abdomen pelvis to evaluate for signs of obstruction.  She denies chest pain, dyspnea, doubt PE or ACS.   Patient's labs returned, showing acute on chronic kidney disease with a creatinine of 4.4, and a potassium of 6.9.  Her EKG shows a left bundle branch block without other acute changes.  She was given calcium gluconate, insulin, and dextrose.  Spoke with Dr. Florene Glen of nephrology, who agrees with continued fluid hydration, medical treatment of hyperkalemia and will evaluate.  Patient with blood pressures dropping despite fluid resuscitation.  Given patient's decreasing blood pressures, she was placed on a small amount of levophed, as we continued fluid hydration.  Given she was not immediately fluid responsive, also covered for possible sepsis of unknown source with vancomycin and Zosyn.  Lactic acid was within normal limits.  Still, overall feel that her presentation is most likely secondary to small bowel obstruction with vomiting resulting in severe hypovolemia, hypotension, and acute on chronic renal failure.  Despite fluid resuscitation of 4L, however, she continues  to have hypotension when we attempt to wean off levophed.   CT scan returned, showing concern for bowel obstruction at the point of her recent anastomosis from her colostomy reversal.  Consult to general surgery.  Will place NG tube.  General surgery continuing small dose of levophed as fluid resuscitation continuing. They are consulting Critical Care and will admit patient for further care.   Final Clinical Impressions(s) / ED Diagnoses   Final diagnoses:  Intractable vomiting with nausea, unspecified vomiting type  Hypotension, unspecified hypotension type  Dehydration  Hyperkalemia  Other complete intestinal obstruction (HCC)  Acute renal failure superimposed on chronic kidney disease, unspecified CKD stage, unspecified acute renal failure type [facility])    ED Discharge Orders    None       Gareth [name], MD 08/22/17 1739

## 2017-08-22 NOTE — H&P (Signed)
[facility] Surgery Consult/Admission Note  Crystal [name] 1951/01/15  [id number].    Requesting MD: Dr. Billy Fischer Chief Complaint/Reason for Consult: Bowel obstruction  HPI:   Pt is a a 67 y.o.femalewith past medical history significant for asthma, heart failure, pacemaker, CKD, depression, diabetes who was recently discharged from the hospital for hypovolemia and hypotension 2/2 severe dehydration from gastroenteritis who presented to [facility] ED today with complaints of abdominal pain, bloating, nausea and vomiting. Pt had a colostomy reversal on December 20th by Dr. Hulen Skains. Pt states she ate a large amount of chinese food on Sunday and by the evening she began vomiting. She had light brown emesis Sunday night through last night. Last night she had jello and a piece of cake that she was able to keep down. Abdominal pain is all over, waxes and wanes, severe at times, non radiating, no alleviating factors. Associated bloating, nausea and vomiting. Pt states last BM was Saturday. Maybe a small amount of flatus yesterday. Pt was not completely sure.   ED Course: K 6.8, Na 133, Creatinine 4.40, GFR 12, WBC WNL CT scan abd/pel: Changes consistent with obstruction at the anastomotic site of the recent colon to colon anastomosis with colonic dilatation and small-bowel dilatation Pt was hypotensive. She received 4L NS bulos, requiring 2 of Levo. NGT ordered.   ROS:  Review of Systems  Constitutional: Positive for malaise/fatigue. Negative for chills, diaphoresis and fever.  HENT: Negative for sore throat.   Respiratory: Negative for cough and shortness of breath.   Cardiovascular: Negative for chest pain.  Gastrointestinal: Positive for abdominal pain, constipation, nausea and vomiting. Negative for blood in stool and diarrhea.  Genitourinary: Negative for dysuria.  Skin: Negative for rash.  Neurological: Positive for weakness. Negative for dizziness and loss of consciousness.  All other systems  reviewed and are negative.    Family History  Problem Relation Age of Onset  . Hypertension Father   . Heart disease Father   . Cancer Father        prostate  . Heart disease Other        (Maternal side) Ischemic heart disease  . Diabetes Mellitus II Other   . Arrhythmia Mother   . Diabetes Mellitus II Mother        Borderline DM  . Hypertension Mother   . Asthma Mother   . Cancer Brother   . Dementia Brother   . Hypertension Brother   . Hypertension Daughter   . Hypertension Daughter   . Diabetes Daughter   . Hypertension Daughter   . Atrial fibrillation Daughter   . GER disease Daughter   . Hypertension Son   . Anxiety disorder Son   . Hypothyroidism Son     Past Medical History:  Diagnosis Date  . AICD (automatic cardioverter/defibrillator) present    high RV threshold chronically, device was turned off in 2014; Device battery has been dead x 7 years  . Anemia, iron deficiency   . Angioedema    felt to likely be due to ace inhibitors but says she has had this even off of medicines, appears to be tolerating ARBs chronically  . Anxiety   . Arthritis    "hands, legs, arms" (07/13/2017)  . Asthmatic bronchitis with status asthmaticus   . Bradycardia   . CHF (congestive heart failure) ([place])   . Chronic lower back pain   . Chronic pain   . Chronic renal insufficiency   . CKD (chronic kidney disease), stage III ([place])   .  Coronary artery disease    Mild nonobstructive (30% LAD, 30% RCA) by 09/01/16 cath at [facility]  . Degenerative joint disease   . Depression   . Diabetes mellitus, type 2 ([place])   . Diabetic peripheral neuropathy ([place])   . Dizziness   . Dyspnea on exertion   . Family history of adverse reaction to anesthesia    Mother has nausea  . GERD (gastroesophageal reflux disease)   . Gout   . Heart valve disorder   . History of blood transfusion 1981; ~ 2005; 12/2016   "childbirth; defibrillator OR; colostomy OR"  . History of mononucleosis  03/2014  . Hyperlipidemia   . Hypertension   . Hypothyroid   . Insomnia   . Intervertebral disc degeneration   . Ischemic cardiomyopathy   . LBBB (left bundle branch block)   . Myocardial infarction ([place]) dx'd ~ 2005  . Nonischemic cardiomyopathy ([place])    s/p ICD in 2005. EF has since recovered  . Obesity   . On home oxygen therapy    "2L at night" (07/13/2017)  . OSA on CPAP   . Pneumonia    "couple times; last time was 12/2016" (07/13/2017)  . PONV (postoperative nausea and vomiting)   . Swelling   . Syncope   . Systemic hypertension   . Vitamin D deficiency     Past Surgical History:  Procedure Laterality Date  . APPENDECTOMY  07/13/2017  . BLADDER SUSPENSION  1990   "w/hysterectomy"  . CARDIAC CATHETERIZATION  2005   no obstructive CAD per patient  . CARDIAC CATHETERIZATION  2018  . CARDIAC DEFIBRILLATOR PLACEMENT  2005   BiV ICD implanted,  LV lead is an epicardial lead  . CARPAL TUNNEL RELEASE Left 07/25/2014   Dr.Williamson   . CATARACT EXTRACTION W/ INTRAOCULAR LENS  IMPLANT, BILATERAL Bilateral   . COLONOSCOPY     2010-2011 Dr.Kipreos   . COLOSTOMY  12/2016   Archie Endo 01/26/2017  . COLOSTOMY REVERSAL  07/13/2017  . COLOSTOMY REVERSAL N/A 07/13/2017   Procedure: COLOSTOMY REVERSAL;  Surgeon: Judeth Horn, MD;  Location: [place];  Service: General;  Laterality: N/A;  . CYST REMOVAL HAND Right 05/2013   thumb  . DILATION AND CURETTAGE OF UTERUS  X 5-6  . IMPLANTABLE CARDIOVERTER DEFIBRILLATOR GENERATOR CHANGE  2008  . TOTAL ABDOMINAL HYSTERECTOMY  1990  . TRIGGER FINGER RELEASE Right 05/213  . TUBAL LIGATION  1980s  . VESICOVAGINAL FISTULA CLOSURE W/ TAH      Social History:  reports that  has never smoked. she has never used smokeless tobacco. She reports that she does not drink alcohol or use drugs.  Allergies:  Allergies  Allergen Reactions  . Glucophage [Metformin Hcl] Other (See Comments)    Renal failure  . Ace Inhibitors Swelling and Other (See  Comments)    Angioedema  . Advicor [Niacin-Lovastatin Er] Other (See Comments)    Muscle aches  . Bystolic [Nebivolol Hcl] Other (See Comments)    Edema  . Erythromycin Diarrhea, Nausea And Vomiting and Other (See Comments)    *DERIVATIVES*  . Lipitor [Atorvastatin] Other (See Comments)    Muscle aches  . Lopid [Gemfibrozil] Other (See Comments)    Muscle aches  . Statins Other (See Comments)    Muscle aches  . Adhesive [Tape] Rash     (Not in a hospital admission)  Blood pressure (!) 96/44, pulse 72, temperature 99.5 F (37.5 C), temperature source Rectal, resp. rate 15, height _0  (1.676 m),  weight 203 lb (92.1 kg), SpO2 99 %.  Physical Exam  Constitutional: She is oriented to person, place, and time and well-developed, well-nourished, and in no distress. Vital signs are normal. No distress.  HENT:  Head: Normocephalic and atraumatic.  Nose: Nose normal.  Mouth/Throat: Oropharynx is clear and moist. No oropharyngeal exudate.  Eyes: Conjunctivae are normal. Pupils are equal, round, and reactive to light. Right eye exhibits no discharge. Left eye exhibits no discharge. No scleral icterus.  Neck: Normal range of motion. Neck supple. No thyromegaly present.  Cardiovascular: Normal rate, regular rhythm, normal heart sounds and intact distal pulses. Exam reveals no gallop and no friction rub.  No murmur heard. Pulses:      Radial pulses are 2+ on the right side, and 2+ on the left side.       Dorsalis pedis pulses are 2+ on the right side, and 2+ on the left side.  Pulmonary/Chest: Effort normal and breath sounds normal. No respiratory distress. She has no decreased breath sounds. She has no wheezes. She has no rhonchi. She has no rales.  Abdominal: Soft. She exhibits distension. She exhibits no mass. Bowel sounds are absent. There is generalized tenderness. There is no rebound and no guarding. No hernia.  Well healed midline and RLQ scar  Musculoskeletal: Normal range of motion.  She exhibits no edema, tenderness or deformity.  Neurological: She is alert and oriented to person, place, and time.  Skin: Skin is warm and dry. No rash noted. She is not diaphoretic.  Psychiatric: Mood and affect normal.  Nursing note and vitals reviewed.   Results for orders placed or performed during the hospital encounter of 08/22/17 (from the past 48 hour(s))  Lipase, blood     Status: None   Collection Time: 08/22/17 10:53 AM  Result Value Ref Range   Lipase 37 11 - 51 U/L  Comprehensive metabolic panel     Status: Abnormal   Collection Time: 08/22/17 10:53 AM  Result Value Ref Range   Sodium 133 (L) 135 - 145 mmol/L   Potassium 6.9 (HH) 3.5 - 5.1 mmol/L    Comment: NO VISIBLE HEMOLYSIS CRITICAL RESULT CALLED TO, READ BACK BY AND VERIFIED WITH: K. COBB RN 1212 08/22/2017 BY A BENNETT    Chloride 96 (L) 101 - 111 mmol/L   CO2 25 22 - 32 mmol/L   Glucose, Bld 99 65 - 99 mg/dL   BUN 73 (H) 6 - 20 mg/dL   Creatinine, Ser 4.08 (H) 0.44 - 1.00 mg/dL   Calcium 9.5 8.9 - 10.3 mg/dL   Total Protein 7.6 6.5 - 8.1 g/dL   Albumin 3.7 3.5 - 5.0 g/dL   AST 18 15 - 41 U/L   ALT 15 14 - 54 U/L   Alkaline Phosphatase 90 38 - 126 U/L   Total Bilirubin 0.9 0.3 - 1.2 mg/dL   GFR calc non Af Amer 10 (L) >60 mL/min   GFR calc Af Amer 12 (L) >60 mL/min    Comment: (NOTE) The eGFR has been calculated using the CKD EPI equation. This calculation has not been validated in all clinical situations. eGFR's persistently <60 mL/min signify possible Chronic Kidney Disease.    Anion gap 12 5 - 15  CBC     Status: None   Collection Time: 08/22/17 10:53 AM  Result Value Ref Range   WBC 9.7 4.0 - 10.5 K/uL   RBC 4.39 3.87 - 5.11 MIL/uL   Hemoglobin 12.0 12.0 - 15.0 g/dL  HCT 37.4 36.0 - 46.0 %   MCV 85.2 78.0 - 100.0 fL   MCH 27.3 26.0 - 34.0 pg   MCHC 32.1 30.0 - 36.0 g/dL   RDW 15.5 11.5 - 15.5 %   Platelets 295 150 - 400 K/uL  CBG monitoring, ED     Status: None   Collection Time:  08/22/17 10:54 AM  Result Value Ref Range   Glucose-Capillary 96 65 - 99 mg/dL  Urinalysis, Routine w reflex microscopic     Status: Abnormal   Collection Time: 08/22/17 10:57 AM  Result Value Ref Range   Color, Urine YELLOW YELLOW   APPearance HAZY (A) CLEAR   Specific Gravity, Urine 1.019 1.005 - 1.030   pH 5.0 5.0 - 8.0   Glucose, UA NEGATIVE NEGATIVE mg/dL   Hgb urine dipstick NEGATIVE NEGATIVE   Bilirubin Urine MODERATE (A) NEGATIVE   Ketones, ur NEGATIVE NEGATIVE mg/dL   Protein, ur NEGATIVE NEGATIVE mg/dL   Nitrite NEGATIVE NEGATIVE   Leukocytes, UA NEGATIVE NEGATIVE  I-Stat Chem 8, ED     Status: Abnormal   Collection Time: 08/22/17 11:40 AM  Result Value Ref Range   Sodium 133 (L) 135 - 145 mmol/L   Potassium 6.8 (HH) 3.5 - 5.1 mmol/L   Chloride 98 (L) 101 - 111 mmol/L   BUN 72 (H) 6 - 20 mg/dL   Creatinine, Ser 4.40 (H) 0.44 - 1.00 mg/dL   Glucose, Bld 96 65 - 99 mg/dL   Calcium, Ion 1.15 1.15 - 1.40 mmol/L   TCO2 29 22 - 32 mmol/L   Hemoglobin 12.2 12.0 - 15.0 g/dL   HCT 36.0 36.0 - 46.0 %   Comment NOTIFIED PHYSICIAN   I-Stat CG4 Lactic Acid, ED     Status: None   Collection Time: 08/22/17 11:41 AM  Result Value Ref Range   Lactic Acid, Venous 1.67 0.5 - 1.9 mmol/L   Ct Abdomen Pelvis Wo Contrast  Result Date: 08/22/2017 CLINICAL DATA:  Abdominal distension, history of recent colostomy reversal EXAM: CT ABDOMEN AND PELVIS WITHOUT CONTRAST TECHNIQUE: Multidetector CT imaging of the abdomen and pelvis was performed following the standard protocol without IV contrast. COMPARISON:  01/27/2017 FINDINGS: Lower chest: Left basilar atelectasis is noted. No sizable effusion is seen. Hepatobiliary: Liver is well visualized and within normal limits. Gallbladder is well distended with dependent small gallstones. Pancreas: Unremarkable. No pancreatic ductal dilatation or surrounding inflammatory changes. Spleen: Normal in size without focal abnormality. Adrenals/Urinary Tract:  Bladder is partially distended. Kidneys are well visualized bilaterally without renal calculi or urinary tract obstructive changes. The adrenals are unremarkable. Stomach/Bowel: There are changes consistent with the recent history of colostomy reversal. The residual colon is dilated with fluid and multiple dilated loops of small bowel are identified consistent with small bowel obstruction. This is likely related to narrowing at the site of anastomosis in the right lower quadrant of the transverse colon to the sigmoid colon. Vascular/Lymphatic: Aortic atherosclerosis. No enlarged abdominal or pelvic lymph nodes. Reproductive: Status post hysterectomy. No adnexal masses. Other: No abdominal wall hernia or abnormality. No abdominopelvic ascites. Musculoskeletal: Degenerative changes of lumbar spine are noted. IMPRESSION: Changes consistent with obstruction at the anastomotic site of the recent colon to colon anastomosis with colonic dilatation and small-bowel dilatation Chronic changes as described. Electronically Signed   By: Inez Catalina M.D.   On: 08/22/2017 13:10   Dg Chest Portable 1 View  Result Date: 08/22/2017 CLINICAL DATA:  67 year old female with a history of nausea and  vomiting for 5 days EXAM: PORTABLE CHEST 1 VIEW COMPARISON:  08/11/2017 FINDINGS: Cardiomediastinal silhouette unchanged, with cardiomegaly. Cardiac pacing device/AICD on the left chest wall, unchanged. No confluent airspace disease, pneumothorax, or pleural effusion. IMPRESSION: No evidence of acute cardiopulmonary disease. Unchanged cardiac pacing device/AICD Electronically Signed   By: Corrie Mckusick D.O.   On: 08/22/2017 12:09      Assessment/Plan Hx of CAD Hx of CHF Pacemaker in place CKD stage III  Hypotensive  - requiring 2 of Levo Hyperkalemic - insulin, D50 and calcium gluconate given in the ED - STAT BMP pending  - 4m of albuterol Hypovolumic - NS @ 1032mhr Acute on chronic kidney injury, creatinine  4.40 Bowel obstruction at colon anastomotic site - NGT, IVF, NPO  Admit to [facility], ICU and consulted CCM to assist with management of medical co morbidities. We appreciate their assistance  JeKalman DrapePANorth Vista Hospitalurgery 08/22/2017, 2:23 PM Pager: [phone]: [phone]-Fri 7:00 am-4:30 pm Sat-Sun 7:00 am-11:30 am

## 2017-08-22 NOTE — ED Notes (Signed)
CBG 96. 

## 2017-08-22 NOTE — Progress Notes (Signed)
Careteam: Patient Care Team: Gildardo Cranker, DO as PCP - General (Internal Medicine) Phineas Inches, MD as Consulting Physician (Nephrology) Judeth Horn, MD as Consulting Physician (General Surgery)  Advanced Directive information    Allergies  Allergen Reactions  . Glucophage [Metformin Hcl] Other (See Comments)    Renal failure  . Ace Inhibitors Swelling and Other (See Comments)    Angioedema  . Advicor [Niacin-Lovastatin Er] Other (See Comments)    Muscle aches  . Bystolic [Nebivolol Hcl] Other (See Comments)    Edema  . Erythromycin Diarrhea, Nausea And Vomiting and Other (See Comments)    *DERIVATIVES*  . Lipitor [Atorvastatin] Other (See Comments)    Muscle aches  . Lopid [Gemfibrozil] Other (See Comments)    Muscle aches  . Statins Other (See Comments)    Muscle aches  . Adhesive [Tape] Rash    Chief Complaint  Patient presents with  . Follow-up    Pt is being seen for a blood pressure check and repeat lab work. Pt reports that she has N&V for several days.      HPI: Patient is a 67 y.o. female seen in the office today to follow up blood pressure and renal function. Pt with past medical history significant for asthma, heart failure, CKD, depression, diabetes went to ED with vomiting and weakness. 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 Patient had reversal of her colostomy in December (reports things have not been good since then) Last OV (1/24) renal function was elevated and she was instructed to stop temisartan and to increase hydration.  Pt with ongoing nausea and vomiting, started 2 days and has not been able to eat or drink anything since then. Her mouth is very dry can not hardly open it.  Increase weakness, could not hardly dress herself today. Here with daughter today.  Having to hold diabetic medication due to low blood sugars.  No diarrhea but having mild abdominal pain.  Has  not had a BM since Saturday.  Feeling very weak, lightheaded and dizzy at this time   Review of Systems:  Review of Systems  Constitutional: Positive for malaise/fatigue. Negative for chills and fever.  Respiratory: Negative for shortness of breath.   Cardiovascular: Negative for chest pain and palpitations.  Gastrointestinal: Positive for abdominal pain, nausea and vomiting. Negative for constipation and diarrhea.  Genitourinary: Negative for dysuria.  Neurological: Positive for dizziness and weakness.    Past Medical History:  Diagnosis Date  . AICD (automatic cardioverter/defibrillator) present    high RV threshold chronically, device was turned off in 2014; Device battery has been dead x 7 years  . Anemia, iron deficiency   . Angioedema    felt to likely be due to ace inhibitors but says she has had this even off of medicines, appears to be tolerating ARBs chronically  . Anxiety   . Arthritis    "hands, legs, arms" (07/13/2017)  . Asthmatic bronchitis with status asthmaticus   . Bradycardia   . CHF (congestive heart failure) ([place])   . Chronic lower back pain   . Chronic pain   . Chronic renal insufficiency   . CKD (chronic kidney disease), stage III ([place])   . Coronary artery disease    Mild nonobstructive (30% LAD, 30% RCA) by 09/01/16 cath at [facility]  . Degenerative joint disease   . Depression   . Diabetes mellitus, type 2 ([place])   . Diabetic peripheral  neuropathy ([place])   . Dizziness   . Dyspnea on exertion   . Family history of adverse reaction to anesthesia    Mother has nausea  . GERD (gastroesophageal reflux disease)   . Gout   . Heart valve disorder   . History of blood transfusion 1981; ~ 2005; 12/2016   "childbirth; defibrillator OR; colostomy OR"  . History of mononucleosis 03/2014  . Hyperlipidemia   . Hypertension   . Hypothyroid   . Insomnia   . Intervertebral disc degeneration   . Ischemic cardiomyopathy   . LBBB (left bundle branch block)    . Myocardial infarction ([place]) dx'd ~ 2005  . Nonischemic cardiomyopathy ([place])    s/p ICD in 2005. EF has since recovered  . Obesity   . On home oxygen therapy    "2L at night" (07/13/2017)  . OSA on CPAP   . Pneumonia    "couple times; last time was 12/2016" (07/13/2017)  . PONV (postoperative nausea and vomiting)   . Swelling   . Syncope   . Systemic hypertension   . Vitamin D deficiency    Past Surgical History:  Procedure Laterality Date  . APPENDECTOMY  07/13/2017  . BLADDER SUSPENSION  1990   "w/hysterectomy"  . CARDIAC CATHETERIZATION  2005   no obstructive CAD per patient  . CARDIAC CATHETERIZATION  2018  . CARDIAC DEFIBRILLATOR PLACEMENT  2005   BiV ICD implanted,  LV lead is an epicardial lead  . CARPAL TUNNEL RELEASE Left 07/25/2014   Dr.Williamson   . CATARACT EXTRACTION W/ INTRAOCULAR LENS  IMPLANT, BILATERAL Bilateral   . COLONOSCOPY     2010-2011 Dr.Kipreos   . COLOSTOMY  12/2016   Archie Endo 01/26/2017  . COLOSTOMY REVERSAL  07/13/2017  . COLOSTOMY REVERSAL N/A 07/13/2017   Procedure: COLOSTOMY REVERSAL;  Surgeon: Judeth Horn, MD;  Location: [place];  Service: General;  Laterality: N/A;  . CYST REMOVAL HAND Right 05/2013   thumb  . DILATION AND CURETTAGE OF UTERUS  X 5-6  . IMPLANTABLE CARDIOVERTER DEFIBRILLATOR GENERATOR CHANGE  2008  . TOTAL ABDOMINAL HYSTERECTOMY  1990  . TRIGGER FINGER RELEASE Right 05/213  . TUBAL LIGATION  1980s  . VESICOVAGINAL FISTULA CLOSURE W/ TAH     Social History:   reports that  has never smoked. she has never used smokeless tobacco. She reports that she does not drink alcohol or use drugs.  Family History  Problem Relation Age of Onset  . Hypertension Father   . Heart disease Father   . Cancer Father        prostate  . Heart disease Other        (Maternal side) Ischemic heart disease  . Diabetes Mellitus II Other   . Arrhythmia Mother   . Diabetes Mellitus II Mother        Borderline DM  . Hypertension Mother   .  Asthma Mother   . Cancer Brother   . Dementia Brother   . Hypertension Brother   . Hypertension Daughter   . Hypertension Daughter   . Diabetes Daughter   . Hypertension Daughter   . Atrial fibrillation Daughter   . GER disease Daughter   . Hypertension Son   . Anxiety disorder Son   . Hypothyroidism Son     Medications: Patient's Medications  New Prescriptions   No medications on file  Previous Medications   ACETAMINOPHEN (TYLENOL) 500 MG TABLET    Take 1,000 mg by mouth every 6 (six) hours  as needed for moderate pain or headache.   CARVEDILOL (COREG) 12.5 MG TABLET    Take 12.5 mg by mouth 2 (two) times daily with a meal.   CRESTOR 10 MG TABLET    Take 1 tablet (10 mg total) by mouth at bedtime.   ESCITALOPRAM (LEXAPRO) 10 MG TABLET    Take 10 mg by mouth at bedtime.   FERROUS SULFATE 325 (65 FE) MG EC TABLET    Take 325 mg by mouth daily.    FLUTICASONE (FLONASE) 50 MCG/ACT NASAL SPRAY    SHAKE LIQUID AND USE 1 SPRAY IN EACH NOSTRIL TWICE DAILY   GABAPENTIN (NEURONTIN) 100 MG CAPSULE    Take 100-800 mg by mouth See admin instructions. Take 100 mg by mouth in the morning and take 800 mg by mouth at bedtime   INSULIN GLARGINE (TOUJEO SOLOSTAR) 300 UNIT/ML SOPN    Inject 50 Units into the skin 2 (two) times daily.   INSULIN GLULISINE (APIDRA) 100 UNIT/ML SOLOSTAR PEN    Inject 20 Units into the skin 3 (three) times daily.   LEVOTHYROXINE (SYNTHROID, LEVOTHROID) 88 MCG TABLET    Take 88 mcg by mouth daily.    LORATADINE (CLARITIN) 10 MG TABLET    Take 10 mg by mouth daily.   MONTELUKAST (SINGULAIR) 10 MG TABLET    Take 10 mg by mouth at bedtime.   OMEGA-3 ACID ETHYL ESTERS (LOVAZA) 1 G CAPSULE    Take 4 g by mouth at bedtime.    ONDANSETRON (ZOFRAN-ODT) 4 MG DISINTEGRATING TABLET    Take 4 mg by mouth every 6 (six) hours as needed.   TELMISARTAN (MICARDIS) 20 MG TABLET    TAKE 1 TABLET BY MOUTH DAILY   TEMAZEPAM (RESTORIL) 15 MG CAPSULE    Take 1 capsule (15 mg total) by mouth at  bedtime as needed for sleep.   TRAMADOL (ULTRAM) 50 MG TABLET    Take 2 tablets (100 mg total) by mouth 2 (two) times daily.   ULORIC 40 MG TABLET    Take 40 mg by mouth daily.   Modified Medications   No medications on file  Discontinued Medications   No medications on file     Physical Exam:  Vitals:   08/22/17 0946  BP: 114/68  Pulse: 73  Temp: 98.7 F (37.1 C)  TempSrc: Oral  SpO2: 95%  Weight: 203 lb (92.1 kg)  Height: 5\' 6"  (1.676 m)   Body mass index is 32.77 kg/m.  Physical Exam  Constitutional: She is oriented to person, place, and time. She appears ill.  HENT:  Head: Normocephalic and atraumatic.  Dry mucous membranes   Eyes: EOM are normal. Pupils are equal, round, and reactive to light.  Cardiovascular: Normal rate and regular rhythm.  Pulmonary/Chest: Effort normal and breath sounds normal.  Abdominal: Soft. Bowel sounds are normal. There is tenderness (epigastric).  Neurological: She is alert and oriented to person, place, and time.  Skin: There is pallor.  Skin tenting    Labs reviewed: Basic Metabolic Panel: Recent Labs    01/29/17 1603  01/31/17 0337 02/01/17 0426  03/15/17 1226  08/12/17 0326 08/13/17 0910 08/17/17 1545  NA 141   < > 135 135   < > 136   < > 138 136 136  K 4.5   < > 4.2 4.4   < > 4.3   < > 4.4 4.6 5.0  CL 98*   < > 95* 99*   < > 99   < >  108 101 106  CO2 27   < > 28 26   < > 25   < > 21* 23 21  GLUCOSE 162*   < > 130* 189*   < > 126*   < > 105* 158* 125  BUN 121*   < > 103* 67*   < > 19   < > 47* 29* 62*  CREATININE 2.96*   < > 1.96* 1.53*   < > 1.32*   < > 2.71* 1.67* 3.10*  CALCIUM 9.6   < > 9.2 9.2   < > 9.4   < > 8.6* 9.3 9.4  MG 2.5*  --  2.7* 2.4  --   --   --   --   --   --   PHOS 4.7*  --  4.5 3.4  --   --   --   --   --   --   TSH  --   --   --   --   --  3.30  --   --   --   --    < > = values in this interval not displayed.   Liver Function Tests: Recent Labs    02/07/17 0622 07/05/17 1045 08/10/17 1841    AST 27 33 17  ALT 37 38 12*  ALKPHOS 187* 101 91  BILITOT 0.6 0.7 0.6  PROT 7.0 7.4 7.4  ALBUMIN 3.2* 3.4* 3.6   Recent Labs    01/26/17 1400  LIPASE 37  AMYLASE 20*   No results for input(s): AMMONIA in the last 8760 hours. CBC: Recent Labs    03/15/17 1226 07/05/17 1045  08/10/17 1841 08/11/17 0301 08/12/17 0326  WBC 7.9 7.2   < > 14.2* 8.8 6.8  NEUTROABS 4,266 3.7  --  10.3*  --   --   HGB 10.1* 12.4   < > 11.7* 10.1* 10.2*  HCT 32.1* 38.1   < > 36.9 32.7* 32.2*  MCV 85.6 81.2   < > 84.6 85.2 83.6  PLT 278 197   < > 309 222 206   < > = values in this interval not displayed.   Lipid Panel: Recent Labs    01/26/17 1341 01/27/17 0505 04/26/17 1639  CHOL  --   --  176  HDL  --   --  36*  TRIG 691* 515* 263*  CHOLHDL  --   --  4.9   TSH: Recent Labs    03/15/17 1226  TSH 3.30   A1C: Lab Results  Component Value Date   HGBA1C 8.4 (H) 07/05/2017     Assessment/Plan 1. AKI (acute kidney injury) ([place]) 2. Nausea and vomiting, intractability of vomiting not specified, unspecified vomiting type 3. Essential hypertension  pts Cr was elevated on 08/17/17, she was instructed to push fluids, stop telmisartan and if symptoms worsened to go to the ED however she is here following up today with N/V for 2+days, no BM for 4 days and mild abdominal pain.  She appears very dehydrated with known elevated Cr and unable to keep down fluids therefore sent to ED for further evaluation and treatment at this time.  Charge nurse at Ed notified of pt coming, daughter with pt and she will bring to [facility] ED at this time  Carlos American. Harle Battiest  [facility] [phone] 8 am - 5 pm) [phone] (after hours)

## 2017-08-22 NOTE — Consult Note (Signed)
Name: Crystal [name] MRN: [id number] DOB: 01-11-1951    ADMISSION DATE:  08/22/2017 CONSULTATION DATE: August 22, 2017  REFERRING MD : General surgery  CHIEF COMPLAINT: Abdominal pain p.o. intake dehydration  BRIEF PATIENT DESCRIPTION: Patient is with hypokalemia acute kidney injury 67 year old status post reanastomosis of colostomy due to a colitis  SIGNIFICANT EVENTS  Hyperkalemia acute kidney injury  STUDIES:  Patient will have an NG tube for Gastrografin   HISTORY OF PRESENT ILLNESS: This is a 67 year old Caucasian female with past medical history of CKD stage III depression diabetes hypertension history of pacemaker defibrillator was sent to the hospital to 3 days history of abdominal pain poor p.o. intake and dehydration patient had recent surgery where she had revision of her colostomy she had colostomy back in the summer 2018 revision of colostomy happened in December she was admitted to the hospital recently with acute kidney injury.  Patient's telling me that she has not taken any nonsteroidal anti-inflammatory drugs she has had abdominal pain briefly where she had patient is denying nausea vomiting but she had no flatus or bowel movements for the past 2 days she had no fever no chills she had poor p.o. intake.  She was given insulin and calcium gluconate I ordered albuterol for catheter NG tube.  She is on Levophed 2 mics  PAST MEDICAL HISTORY :   has a past medical history of AICD (automatic cardioverter/defibrillator) present, Anemia, iron deficiency, Angioedema, Anxiety, Arthritis, Asthmatic bronchitis with status asthmaticus, Bradycardia, CHF (congestive heart failure) (HCC), Chronic lower back pain, Chronic pain, Chronic renal insufficiency, CKD (chronic kidney disease), stage III ([place]), Coronary artery disease, Degenerative joint disease, Depression, Diabetes mellitus, type 2 ([place]), Diabetic peripheral neuropathy ([place]), Dizziness, Dyspnea on exertion, Family history of  adverse reaction to anesthesia, GERD (gastroesophageal reflux disease), Gout, Heart valve disorder, History of blood transfusion (1981; ~ 2005; 12/2016), History of mononucleosis (03/2014), Hyperlipidemia, Hypertension, Hypothyroid, Insomnia, Intervertebral disc degeneration, Ischemic cardiomyopathy, LBBB (left bundle branch block), Myocardial infarction ([place]) (dx'd ~ 2005), Nonischemic cardiomyopathy ([place]), Obesity, On home oxygen therapy, OSA on CPAP, Pneumonia, PONV (postoperative nausea and vomiting), Swelling, Syncope, Systemic hypertension, and Vitamin D deficiency.  has a past surgical history that includes Vesicovaginal fistula closure w/ TAH; Cyst removal hand (Right, 05/2013); Trigger finger release (Right, 05/213); Cardiac defibrillator placement (2005); Implantable cardioverter defibrillator generator change (2008); Carpal tunnel release (Left, 07/25/2014); Colonoscopy; Colostomy (12/2016); Colostomy reversal (07/13/2017); Appendectomy (07/13/2017); Cataract extraction w/ intraocular lens  implant, bilateral (Bilateral); Cardiac catheterization (2005); Cardiac catheterization (2018); Dilation and curettage of uterus (X 5-6); Tubal ligation (1980s); Bladder suspension (1990); Total abdominal hysterectomy (1990); and Colostomy reversal (N/A, 07/13/2017). Prior to Admission medications   Medication Sig Start Date End Date Taking? Authorizing Provider  acetaminophen (TYLENOL) 500 MG tablet Take 1,000 mg by mouth every 6 (six) hours as needed for moderate pain or headache.   Yes [provider]  carvedilol (COREG) 12.5 MG tablet Take 12.5 mg by mouth 2 (two) times daily with a meal.   Yes [provider]  CRESTOR 10 MG tablet Take 1 tablet (10 mg total) by mouth at bedtime. 02/15/17  Yes Love, Ivan Anchors, PA-C  escitalopram (LEXAPRO) 10 MG tablet Take 10 mg by mouth at bedtime.   Yes [provider]  ferrous sulfate 325 (65 FE) MG EC tablet Take 325 mg by mouth daily.    Yes  [provider]  fluticasone (FLONASE) 50 MCG/ACT nasal spray SHAKE LIQUID AND USE 1 SPRAY IN [facility]  NOSTRIL TWICE DAILY 08/17/17  Yes Lauree Chandler, NP  gabapentin (NEURONTIN) 100 MG capsule Take 100-800 mg by mouth See admin instructions. Take 100 mg by mouth in the morning and take 800 mg by mouth at bedtime   Yes [provider]  Insulin Glargine (TOUJEO SOLOSTAR) 300 UNIT/ML SOPN Inject 50 Units into the skin 2 (two) times daily. 07/17/17  Yes Kinsinger, Arta Bruce, MD  Insulin Glulisine (APIDRA) 100 UNIT/ML Solostar Pen Inject 20 Units into the skin 3 (three) times daily.   Yes [provider]  levothyroxine (SYNTHROID, LEVOTHROID) 88 MCG tablet Take 88 mcg by mouth daily.  10/16/15  Yes [provider]  loratadine (CLARITIN) 10 MG tablet Take 10 mg by mouth daily.   Yes [provider]  Misc. Devices MISC by Does not apply route. C-pap   Yes [provider]  montelukast (SINGULAIR) 10 MG tablet Take 10 mg by mouth at bedtime.   Yes [provider]  omega-3 acid ethyl esters (LOVAZA) 1 g capsule Take 4 g by mouth at bedtime.    Yes [provider]  Omega-3 Fatty Acids (FISH OIL) 1000 MG CAPS Take 1,000 capsules by mouth daily.   Yes [provider]  ondansetron (ZOFRAN-ODT) 4 MG disintegrating tablet Take 4 mg by mouth every 6 (six) hours as needed. 07/20/17  Yes [provider]  OXYGEN Inhale 2 L into the lungs.   Yes [provider]  promethazine (PHENERGAN) 25 MG tablet Take 25 mg by mouth every 6 (six) hours as needed for nausea or vomiting.   Yes [provider]  temazepam (RESTORIL) 15 MG capsule Take 1 capsule (15 mg total) by mouth at bedtime as needed for sleep. 07/20/17  Yes Eulas Post, Monica, DO  traMADol (ULTRAM) 50 MG tablet Take 2 tablets (100 mg total) by mouth 2 (two) times daily. 07/17/17  Yes Kinsinger, Arta Bruce, MD  ULORIC 40 MG tablet Take 40 mg by mouth daily.  01/29/14   Yes [provider]   Allergies  Allergen Reactions  . Glucophage [Metformin Hcl] Other (See Comments)    Renal failure  . Ace Inhibitors Swelling and Other (See Comments)    Angioedema  . Advicor [Niacin-Lovastatin Er] Other (See Comments)    Muscle aches  . Bystolic [Nebivolol Hcl] Other (See Comments)    Edema  . Erythromycin Diarrhea, Nausea And Vomiting and Other (See Comments)    *DERIVATIVES*  . Lipitor [Atorvastatin] Other (See Comments)    Muscle aches  . Lopid [Gemfibrozil] Other (See Comments)    Muscle aches  . Statins Other (See Comments)    Muscle aches  . Adhesive [Tape] Rash    FAMILY HISTORY:  family history includes Anxiety disorder in her son; Arrhythmia in her mother; Asthma in her mother; Atrial fibrillation in her daughter; Cancer in her brother and father; Dementia in her brother; Diabetes in her daughter; Diabetes Mellitus II in her mother and other; GER disease in her daughter; Heart disease in her father and other; Hypertension in her brother, daughter, daughter, daughter, father, mother, and son; Hypothyroidism in her son. SOCIAL HISTORY:  reports that  has never smoked. she has never used smokeless tobacco. She reports that she does not drink alcohol or use drugs.  REVIEW OF SYSTEMS:   Constitutional: Negative for fever, chills, weight loss, malaise/fatigue and diaphoresis.  HENT: Negative for hearing loss, ear pain, nosebleeds, congestion, sore throat, neck pain, tinnitus and ear discharge.   Eyes: Negative for  blurred vision, double vision, photophobia, pain, discharge and redness.  Respiratory: Negative for cough, hemoptysis, sputum production, shortness of breath, wheezing and stridor.   Cardiovascular: Negative for chest pain, palpitations, orthopnea, claudication, leg swelling and PND.  Gastrointestinal: Negative for heartburn, nausea, vomiting, abdominal pain, diarrhea, constipation, blood in stool and melena.  Genitourinary: Negative  for dysuria, urgency, frequency, hematuria and flank pain.  Musculoskeletal: Negative for myalgias, back pain, joint pain and falls.  Skin: Negative for itching and rash.  Neurological: Negative for dizziness, tingling, tremors, sensory change, speech change, focal weakness, seizures, loss of consciousness, weakness and headaches.  Endo/Heme/Allergies: Negative for environmental allergies and polydipsia. Does not bruise/bleed easily.  SUBJECTIVE:   VITAL SIGNS: Temp:  [98.3 F (36.8 C)-99.5 F (37.5 C)] 99.5 F (37.5 C) (01/29 1132) Pulse Rate:  [69-75] 72 (01/29 1300) Resp:  [13-18] 15 (01/29 1300) BP: (75-114)/(36-68) 96/44 (01/29 1300) SpO2:  [92 %-100 %] 99 % (01/29 1300) Weight:  [203 lb (92.1 kg)] 203 lb (92.1 kg) (01/29 1056)  PHYSICAL EXAMINATION: General: Mild acute distress from abdominal distention otherwise mild shortness of breath Neuro:  WNL , AOX3 , EOMI , CN II-XII intact , UL , LL strength is symmetrical and 5/5 HEENT:  atraumatic , no jaundice , dry mucous membranes  Cardiovascular: Regular regular, ESM 2/6 in the aortic area  Lungs:  CTA bilateral , no wheezing or crackles  Abdomen: Severely distended no rebound negative bowel sounds Musculoskeletal:  WNL , normal pulses  Skin:  No rash    Recent Labs  Lab 08/17/17 1545 08/22/17 1053 08/22/17 1140  NA 136 133* 133*  K 5.0 6.9* 6.8*  CL 106 96* 98*  CO2 21 25  --   BUN 62* 73* 72*  CREATININE 3.10* 4.08* 4.40*  GLUCOSE 125 99 96   Recent Labs  Lab 08/22/17 1053 08/22/17 1140  HGB 12.0 12.2  HCT 37.4 36.0  WBC 9.7  --   PLT 295  --    Ct Abdomen Pelvis Wo Contrast  Result Date: 08/22/2017 CLINICAL DATA:  Abdominal distension, history of recent colostomy reversal EXAM: CT ABDOMEN AND PELVIS WITHOUT CONTRAST TECHNIQUE: Multidetector CT imaging of the abdomen and pelvis was performed following the standard protocol without IV contrast. COMPARISON:  01/27/2017 FINDINGS: Lower chest: Left basilar  atelectasis is noted. No sizable effusion is seen. Hepatobiliary: Liver is well visualized and within normal limits. Gallbladder is well distended with dependent small gallstones. Pancreas: Unremarkable. No pancreatic ductal dilatation or surrounding inflammatory changes. Spleen: Normal in size without focal abnormality. Adrenals/Urinary Tract: Bladder is partially distended. Kidneys are well visualized bilaterally without renal calculi or urinary tract obstructive changes. The adrenals are unremarkable. Stomach/Bowel: There are changes consistent with the recent history of colostomy reversal. The residual colon is dilated with fluid and multiple dilated loops of small bowel are identified consistent with small bowel obstruction. This is likely related to narrowing at the site of anastomosis in the right lower quadrant of the transverse colon to the sigmoid colon. Vascular/Lymphatic: Aortic atherosclerosis. No enlarged abdominal or pelvic lymph nodes. Reproductive: Status post hysterectomy. No adnexal masses. Other: No abdominal wall hernia or abnormality. No abdominopelvic ascites. Musculoskeletal: Degenerative changes of lumbar spine are noted. IMPRESSION: Changes consistent with obstruction at the anastomotic site of the recent colon to colon anastomosis with colonic dilatation and small-bowel dilatation Chronic changes as described. Electronically Signed   By: Inez Catalina M.D.   On: 08/22/2017 13:10   Dg Chest Portable 1 View  Result Date: 08/22/2017 CLINICAL DATA:  67 year old female with a history of nausea and vomiting for 5 days EXAM: PORTABLE CHEST 1 VIEW COMPARISON:  08/11/2017 FINDINGS: Cardiomediastinal silhouette unchanged, with cardiomegaly. Cardiac pacing device/AICD on the left chest wall, unchanged. No confluent airspace disease, pneumothorax, or pleural effusion. IMPRESSION: No evidence of acute cardiopulmonary disease. Unchanged cardiac pacing device/AICD Electronically Signed   By: Corrie Mckusick D.O.   On: 08/22/2017 12:09    ASSESSMENT / PLAN:  --Acute kidney injury this is all prerenal from dehydration third spacing aggressive hydration avoid nephrotoxic agents and start the patient on strict in and out she needs a Foley and she needs an NG tube at this point patient was given several boluses we will put her on maintenance fluids after that she had a history of stage III chronic kidney disease.  --Hyperkalemia from acute kidney injury avoid nephrotoxic agent avoid potassium supplementation nephrology is consulted patient was shifted she cannot be given Kayexalate given half GI obstruction she will be given insulin 10 mg of albuterol inhaled we will start her on a bicarb drip without D5 and monitor her potassium nephrology was consulted.  She needs a strict INO she needs Foley.  --GI obstruction status post colostomy reanastomosis end-to-end reanastomosis patient will have an NG tube surgeon is planning to do Gastrografin and imaging to see if the patient needs any surgical intervention or consider conservative treatment.   --Hypertension septic she is on low-dose levo 2 mg micrograms continue with that she needs a aggressive IVF resuscitation given the dehydration third spacing poor p.o. Intake.   --Patient will be on the ICU she will be on DVT GI prophylaxis she is a full code critical care time is 35 minutes please call us with questions thank you for this consultation.  Pulmonary and [place] Pager: [phone]  08/22/2017, 3:57 PM

## 2017-08-22 NOTE — ED Triage Notes (Signed)
Pt. Reports having n/v for 5 days.  Pt. Is very weak, and pale. Rt. Lower abdominal pain for a few weeks.  Pt. 's BP is 75/46,  Pt. Denies any chills for fever.  Pt. Was at her PCP this morning and sent to [place] for dehydration,  CBG is 96

## 2017-08-22 NOTE — Consult Note (Addendum)
[facility] Consult Note     Date: 08/22/2017                  Patient Name:  Crystal [name]  MRN: [id number]  DOB: Sep 10, 1950  Age / Sex: 67 y.o., female         PCP: Gildardo Cranker, DO                 Service Requesting Consult: Dr. 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  She underwent colostomy takedown in December which was relatively uncomplicated.  Approximately 2 weeks ago, she presented to ED with n/v/d and decreased urine output.  She was found to have AKI with creatinine peaking at 6.00 which improved with IVFs to 1.6 (baseline 1.2) by 08/13/2017.  She had labs with her PCP on 08/17/17 which noted her creatinine was up to 3.1 with a K of 5.0.  Diarrhea was noted at that appt.    She returns today with intractable n/v.  Cr is up to 4.08 with a K of 6.9.  CT abd/ pelvis confirming obstruction at anastamotic site.  She has gotten aggressive fluid resuscitation and has had a Foley placed.  There is a good amount of clear yellow urine in the bag.  Repeat K 5.8 and Cr 3.3.  An NGT is about to be inserted.  She has been medically treated for hyperK and is now on a bicarb gtt.  Norepi has been started as well along with broad-spectrum vanc/ Zosyn.    Past Medical History:  Diagnosis Date  . AICD (automatic cardioverter/defibrillator) present    high RV threshold chronically, device was turned off in 2014; Device battery has been dead x 7 years  . Anemia, iron deficiency   . Angioedema    felt to likely be due to ace inhibitors but says she has had this even off of medicines, appears to be tolerating ARBs chronically  . Anxiety   . Arthritis    "hands, legs, arms" (07/13/2017)  . Asthmatic bronchitis with status  asthmaticus   . Bradycardia   . CHF (congestive heart failure) ([place])   . Chronic lower back pain   . Chronic pain   . Chronic renal insufficiency   . CKD (chronic kidney disease), stage III ([place])   . Coronary artery disease    Mild nonobstructive (30% LAD, 30% RCA) by 09/01/16 cath at [facility]  . Degenerative joint disease   . Depression   . Diabetes mellitus, type 2 ([place])   . Diabetic peripheral neuropathy ([place])   . Dizziness   . Dyspnea on exertion   . Family history of adverse reaction to anesthesia    Mother has nausea  . GERD (gastroesophageal reflux disease)   . Gout   . Heart valve disorder   . History of blood transfusion 1981; ~ 2005; 12/2016   "childbirth; defibrillator OR; colostomy OR"  . History of mononucleosis 03/2014  . Hyperlipidemia   . Hypertension   . Hypothyroid   . Insomnia   . Intervertebral disc degeneration   . Ischemic cardiomyopathy   . LBBB (left bundle branch  block)   . Myocardial infarction ([place]) dx'd ~ 2005  . Nonischemic cardiomyopathy ([place])    s/p ICD in 2005. EF has since recovered  . Obesity   . On home oxygen therapy    "2L at night" (07/13/2017)  . OSA on CPAP   . Pneumonia    "couple times; last time was 12/2016" (07/13/2017)  . PONV (postoperative nausea and vomiting)   . Swelling   . Syncope   . Systemic hypertension   . Vitamin D deficiency     Past Surgical History:  Procedure Laterality Date  . APPENDECTOMY  07/13/2017  . BLADDER SUSPENSION  1990   "w/hysterectomy"  . CARDIAC CATHETERIZATION  2005   no obstructive CAD per patient  . CARDIAC CATHETERIZATION  2018  . CARDIAC DEFIBRILLATOR PLACEMENT  2005   BiV ICD implanted,  LV lead is an epicardial lead  . CARPAL TUNNEL RELEASE Left 07/25/2014   Dr.Williamson   . CATARACT EXTRACTION W/ INTRAOCULAR LENS  IMPLANT, BILATERAL Bilateral   . COLONOSCOPY     2010-2011 Dr.Kipreos   . COLOSTOMY  12/2016   Archie Endo 01/26/2017  . COLOSTOMY REVERSAL  07/13/2017  .  COLOSTOMY REVERSAL N/A 07/13/2017   Procedure: COLOSTOMY REVERSAL;  Surgeon: Judeth Horn, MD;  Location: [place];  Service: General;  Laterality: N/A;  . CYST REMOVAL HAND Right 05/2013   thumb  . DILATION AND CURETTAGE OF UTERUS  X 5-6  . IMPLANTABLE CARDIOVERTER DEFIBRILLATOR GENERATOR CHANGE  2008  . TOTAL ABDOMINAL HYSTERECTOMY  1990  . TRIGGER FINGER RELEASE Right 05/213  . TUBAL LIGATION  1980s  . VESICOVAGINAL FISTULA CLOSURE W/ TAH      Family History  Problem Relation Age of Onset  . Hypertension Father   . Heart disease Father   . Cancer Father        prostate  . Heart disease Other        (Maternal side) Ischemic heart disease  . Diabetes Mellitus II Other   . Arrhythmia Mother   . Diabetes Mellitus II Mother        Borderline DM  . Hypertension Mother   . Asthma Mother   . Cancer Brother   . Dementia Brother   . Hypertension Brother   . Hypertension Daughter   . Hypertension Daughter   . Diabetes Daughter   . Hypertension Daughter   . Atrial fibrillation Daughter   . GER disease Daughter   . Hypertension Son   . Anxiety disorder Son   . Hypothyroidism Son    Social History:  reports that  has never smoked. she has never used smokeless tobacco. She reports that she does not drink alcohol or use drugs.  Allergies:  Allergies  Allergen Reactions  . Glucophage [Metformin Hcl] Other (See Comments)    Renal failure  . Ace Inhibitors Swelling and Other (See Comments)    Angioedema  . Advicor [Niacin-Lovastatin Er] Other (See Comments)    Muscle aches  . Bystolic [Nebivolol Hcl] Other (See Comments)    Edema  . Erythromycin Diarrhea, Nausea And Vomiting and Other (See Comments)    *DERIVATIVES*  . Lipitor [Atorvastatin] Other (See Comments)    Muscle aches  . Lopid [Gemfibrozil] Other (See Comments)    Muscle aches  . Statins Other (See Comments)    Muscle aches  . Adhesive [Tape] Rash     (Not in a hospital admission)  Results for orders placed  or performed during the hospital encounter of 08/22/17 (from  the past 48 hour(s))  Lipase, blood     Status: None   Collection Time: 08/22/17 10:53 AM  Result Value Ref Range   Lipase 37 11 - 51 U/L  Comprehensive metabolic panel     Status: Abnormal   Collection Time: 08/22/17 10:53 AM  Result Value Ref Range   Sodium 133 (L) 135 - 145 mmol/L   Potassium 6.9 (HH) 3.5 - 5.1 mmol/L    Comment: NO VISIBLE HEMOLYSIS CRITICAL RESULT CALLED TO, READ BACK BY AND VERIFIED WITH: K. COBB RN 1212 08/22/2017 BY A BENNETT    Chloride 96 (L) 101 - 111 mmol/L   CO2 25 22 - 32 mmol/L   Glucose, Bld 99 65 - 99 mg/dL   BUN 73 (H) 6 - 20 mg/dL   Creatinine, Ser 4.08 (H) 0.44 - 1.00 mg/dL   Calcium 9.5 8.9 - 10.3 mg/dL   Total Protein 7.6 6.5 - 8.1 g/dL   Albumin 3.7 3.5 - 5.0 g/dL   AST 18 15 - 41 U/L   ALT 15 14 - 54 U/L   Alkaline Phosphatase 90 38 - 126 U/L   Total Bilirubin 0.9 0.3 - 1.2 mg/dL   GFR calc non Af Amer 10 (L) >60 mL/min   GFR calc Af Amer 12 (L) >60 mL/min    Comment: (NOTE) The eGFR has been calculated using the CKD EPI equation. This calculation has not been validated in all clinical situations. eGFR's persistently <60 mL/min signify possible Chronic Kidney Disease.    Anion gap 12 5 - 15  CBC     Status: None   Collection Time: 08/22/17 10:53 AM  Result Value Ref Range   WBC 9.7 4.0 - 10.5 K/uL   RBC 4.39 3.87 - 5.11 MIL/uL   Hemoglobin 12.0 12.0 - 15.0 g/dL   HCT 37.4 36.0 - 46.0 %   MCV 85.2 78.0 - 100.0 fL   MCH 27.3 26.0 - 34.0 pg   MCHC 32.1 30.0 - 36.0 g/dL   RDW 15.5 11.5 - 15.5 %   Platelets 295 150 - 400 K/uL  CBG monitoring, ED     Status: None   Collection Time: 08/22/17 10:54 AM  Result Value Ref Range   Glucose-Capillary 96 65 - 99 mg/dL  Urinalysis, Routine w reflex microscopic     Status: Abnormal   Collection Time: 08/22/17 10:57 AM  Result Value Ref Range   Color, Urine YELLOW YELLOW   APPearance HAZY (A) CLEAR   Specific Gravity, Urine  1.019 1.005 - 1.030   pH 5.0 5.0 - 8.0   Glucose, UA NEGATIVE NEGATIVE mg/dL   Hgb urine dipstick NEGATIVE NEGATIVE   Bilirubin Urine MODERATE (A) NEGATIVE   Ketones, ur NEGATIVE NEGATIVE mg/dL   Protein, ur NEGATIVE NEGATIVE mg/dL   Nitrite NEGATIVE NEGATIVE   Leukocytes, UA NEGATIVE NEGATIVE  I-Stat Chem 8, ED     Status: Abnormal   Collection Time: 08/22/17 11:40 AM  Result Value Ref Range   Sodium 133 (L) 135 - 145 mmol/L   Potassium 6.8 (HH) 3.5 - 5.1 mmol/L   Chloride 98 (L) 101 - 111 mmol/L   BUN 72 (H) 6 - 20 mg/dL   Creatinine, Ser 4.40 (H) 0.44 - 1.00 mg/dL   Glucose, Bld 96 65 - 99 mg/dL   Calcium, Ion 1.15 1.15 - 1.40 mmol/L   TCO2 29 22 - 32 mmol/L   Hemoglobin 12.2 12.0 - 15.0 g/dL   HCT 36.0 36.0 - 46.0 %  Comment NOTIFIED PHYSICIAN   I-Stat CG4 Lactic Acid, ED     Status: None   Collection Time: 08/22/17 11:41 AM  Result Value Ref Range   Lactic Acid, Venous 1.67 0.5 - 1.9 mmol/L  I-Stat CG4 Lactic Acid, ED     Status: None   Collection Time: 08/22/17  3:40 PM  Result Value Ref Range   Lactic Acid, Venous 0.77 0.5 - 1.9 mmol/L  Basic metabolic panel     Status: Abnormal   Collection Time: 08/22/17  3:56 PM  Result Value Ref Range   Sodium 136 135 - 145 mmol/L   Potassium 5.8 (H) 3.5 - 5.1 mmol/L   Chloride 104 101 - 111 mmol/L   CO2 23 22 - 32 mmol/L   Glucose, Bld 76 65 - 99 mg/dL   BUN 66 (H) 6 - 20 mg/dL   Creatinine, Ser 3.36 (H) 0.44 - 1.00 mg/dL   Calcium 8.4 (L) 8.9 - 10.3 mg/dL   GFR calc non Af Amer 13 (L) >60 mL/min   GFR calc Af Amer 15 (L) >60 mL/min    Comment: (NOTE) The eGFR has been calculated using the CKD EPI equation. This calculation has not been validated in all clinical situations. eGFR's persistently <60 mL/min signify possible Chronic Kidney Disease.    Anion gap 9 5 - 15   Ct Abdomen Pelvis Wo Contrast  Result Date: 08/22/2017 CLINICAL DATA:  Abdominal distension, history of recent colostomy reversal EXAM: CT ABDOMEN AND  PELVIS WITHOUT CONTRAST TECHNIQUE: Multidetector CT imaging of the abdomen and pelvis was performed following the standard protocol without IV contrast. COMPARISON:  01/27/2017 FINDINGS: Lower chest: Left basilar atelectasis is noted. No sizable effusion is seen. Hepatobiliary: Liver is well visualized and within normal limits. Gallbladder is well distended with dependent small gallstones. Pancreas: Unremarkable. No pancreatic ductal dilatation or surrounding inflammatory changes. Spleen: Normal in size without focal abnormality. Adrenals/Urinary Tract: Bladder is partially distended. Kidneys are well visualized bilaterally without renal calculi or urinary tract obstructive changes. The adrenals are unremarkable. Stomach/Bowel: There are changes consistent with the recent history of colostomy reversal. The residual colon is dilated with fluid and multiple dilated loops of small bowel are identified consistent with small bowel obstruction. This is likely related to narrowing at the site of anastomosis in the right lower quadrant of the transverse colon to the sigmoid colon. Vascular/Lymphatic: Aortic atherosclerosis. No enlarged abdominal or pelvic lymph nodes. Reproductive: Status post hysterectomy. No adnexal masses. Other: No abdominal wall hernia or abnormality. No abdominopelvic ascites. Musculoskeletal: Degenerative changes of lumbar spine are noted. IMPRESSION: Changes consistent with obstruction at the anastomotic site of the recent colon to colon anastomosis with colonic dilatation and small-bowel dilatation Chronic changes as described. Electronically Signed   By: Inez Catalina M.D.   On: 08/22/2017 13:10   Dg Chest Portable 1 View  Result Date: 08/22/2017 CLINICAL DATA:  67 year old female with a history of nausea and vomiting for 5 days EXAM: PORTABLE CHEST 1 VIEW COMPARISON:  08/11/2017 FINDINGS: Cardiomediastinal silhouette unchanged, with cardiomegaly. Cardiac pacing device/AICD on the left chest  wall, unchanged. No confluent airspace disease, pneumothorax, or pleural effusion. IMPRESSION: No evidence of acute cardiopulmonary disease. Unchanged cardiac pacing device/AICD Electronically Signed   By: Corrie Mckusick D.O.   On: 08/22/2017 12:09   Dg Abd Portable 1 View  Result Date: 08/22/2017 CLINICAL DATA:  NG tube placement. EXAM: PORTABLE ABDOMEN - 1 VIEW COMPARISON:  CT, 08/22/2017 FINDINGS: NG tube passes below the diaphragm. Tip  and side hole lie within the stomach, tip projecting in the mid stomach. IMPRESSION: NG tube tip lies in the mid stomach. Electronically Signed   By: Lajean Manes M.D.   On: 08/22/2017 18:24    ROS: all other systems reviewed and negative as per HPI  Blood pressure (!) 96/44, pulse 68, temperature 99.5 F (37.5 C), temperature source Rectal, resp. rate 13, height _0  (1.676 m), weight 92.1 kg (203 lb), SpO2 96 %. Physical Exam  GEN NAD, sitting in bed HEENT EOMI, PERRL NECK flat neck veins PULM clear bilaterally CV RRR soft systolic murmur ABD well-healing midline scar, distended, tympanic, hypoactive bowel sounds, slight area of firmness in RLQ but not rigid EXT no LE edema GU: Foley draining clear yellow urine NEURO AAO x 3 SKIN: warm and dry   Assessment/Plan  1.  AKI on CKD III: Recurrent AKI due to volume depletion.  Fortunately improving with IVFs.  Concerned that repeated AKI will leave pt with sig CKD in the near future.  Agree with supportive measures.  Excellent resuscitation has already been achieved.  She does not need dialysis.    2.  Hyperkalemia: due to decreased water and sodium delivery to the distal nephron in setting of hypovolemia, impairing K secretion.  Expect to improve with supportive care esp now that making urine.  Recommend serial K, have placed order for tonight.  She wasn't terribly acidemic so if K improving would d/c bicarb gtt tomorrow AM.  3.  Bowel obstruction: NGT being inserted (of note, suctioning gastric  contents will also drive K down), surgery c/s  4.  Septic shock: cultures, antibiotics, and fluids/pressors per primary.  Madelon Lips, MD [facility] pgr [phone] 08/22/2017, 6:31 PM

## 2017-08-23 ENCOUNTER — Other Ambulatory Visit: Payer: Self-pay

## 2017-08-23 ENCOUNTER — Inpatient Hospital Stay (HOSPITAL_COMMUNITY): Payer: Medicare Other

## 2017-08-23 ENCOUNTER — Encounter (HOSPITAL_COMMUNITY): Payer: Self-pay | Admitting: General Practice

## 2017-08-23 DIAGNOSIS — K56609 Unspecified intestinal obstruction, unspecified as to partial versus complete obstruction: Secondary | ICD-10-CM | POA: Diagnosis present

## 2017-08-23 LAB — CBC WITH DIFFERENTIAL/PLATELET
Basophils Absolute: 0 10*3/uL (ref 0.0–0.1)
Basophils Relative: 0 %
Eosinophils Absolute: 0.2 10*3/uL (ref 0.0–0.7)
Eosinophils Relative: 4 %
HCT: 32.8 % — ABNORMAL LOW (ref 36.0–46.0)
HEMOGLOBIN: 10.2 g/dL — AB (ref 12.0–15.0)
LYMPHS PCT: 30 %
Lymphs Abs: 1.4 10*3/uL (ref 0.7–4.0)
MCH: 26.5 pg (ref 26.0–34.0)
MCHC: 31.1 g/dL (ref 30.0–36.0)
MCV: 85.2 fL (ref 78.0–100.0)
MONOS PCT: 9 %
Monocytes Absolute: 0.4 10*3/uL (ref 0.1–1.0)
NEUTROS PCT: 57 %
Neutro Abs: 2.7 10*3/uL (ref 1.7–7.7)
Platelets: 208 10*3/uL (ref 150–400)
RBC: 3.85 MIL/uL — AB (ref 3.87–5.11)
RDW: 15.5 % (ref 11.5–15.5)
WBC: 4.7 10*3/uL (ref 4.0–10.5)

## 2017-08-23 LAB — RENAL FUNCTION PANEL
Albumin: 2.7 g/dL — ABNORMAL LOW (ref 3.5–5.0)
Anion gap: 10 (ref 5–15)
BUN: 51 mg/dL — AB (ref 6–20)
CHLORIDE: 102 mmol/L (ref 101–111)
CO2: 27 mmol/L (ref 22–32)
Calcium: 8 mg/dL — ABNORMAL LOW (ref 8.9–10.3)
Creatinine, Ser: 2.41 mg/dL — ABNORMAL HIGH (ref 0.44–1.00)
GFR calc Af Amer: 23 mL/min — ABNORMAL LOW (ref >60)
GFR, EST NON AFRICAN AMERICAN: 20 mL/min — AB (ref >60)
Glucose, Bld: 59 mg/dL — ABNORMAL LOW (ref 65–99)
POTASSIUM: 4.7 mmol/L (ref 3.5–5.1)
Phosphorus: 3.3 mg/dL (ref 2.5–4.6)
Sodium: 139 mmol/L (ref 135–145)

## 2017-08-23 LAB — GLUCOSE, CAPILLARY
GLUCOSE-CAPILLARY: 141 mg/dL — AB (ref 65–99)
GLUCOSE-CAPILLARY: 83 mg/dL (ref 65–99)
Glucose-Capillary: 57 mg/dL — ABNORMAL LOW (ref 65–99)

## 2017-08-23 LAB — CBG MONITORING, ED
GLUCOSE-CAPILLARY: 52 mg/dL — AB (ref 65–99)
GLUCOSE-CAPILLARY: 58 mg/dL — AB (ref 65–99)

## 2017-08-23 LAB — PROTIME-INR
INR: 1.11
PROTHROMBIN TIME: 14.2 s (ref 11.4–15.2)

## 2017-08-23 LAB — COMPREHENSIVE METABOLIC PANEL
ALK PHOS: 69 U/L (ref 38–126)
ALT: 13 U/L — AB (ref 14–54)
AST: 15 U/L (ref 15–41)
Albumin: 2.7 g/dL — ABNORMAL LOW (ref 3.5–5.0)
Anion gap: 9 (ref 5–15)
BILIRUBIN TOTAL: 0.6 mg/dL (ref 0.3–1.2)
BUN: 49 mg/dL — ABNORMAL HIGH (ref 6–20)
CALCIUM: 8.1 mg/dL — AB (ref 8.9–10.3)
CO2: 28 mmol/L (ref 22–32)
CREATININE: 2.46 mg/dL — AB (ref 0.44–1.00)
Chloride: 103 mmol/L (ref 101–111)
GFR calc Af Amer: 22 mL/min — ABNORMAL LOW (ref >60)
GFR, EST NON AFRICAN AMERICAN: 19 mL/min — AB (ref >60)
Glucose, Bld: 60 mg/dL — ABNORMAL LOW (ref 65–99)
Potassium: 4.8 mmol/L (ref 3.5–5.1)
Sodium: 140 mmol/L (ref 135–145)
TOTAL PROTEIN: 5.7 g/dL — AB (ref 6.5–8.1)

## 2017-08-23 LAB — APTT: aPTT: 27 seconds (ref 24–36)

## 2017-08-23 LAB — I-STAT ARTERIAL BLOOD GAS, ED
Acid-Base Excess: 8 mmol/L — ABNORMAL HIGH (ref 0.0–2.0)
Bicarbonate: 32.8 mmol/L — ABNORMAL HIGH (ref 20.0–28.0)
O2 Saturation: 93 %
PCO2 ART: 49.3 mmHg — AB (ref 32.0–48.0)
PH ART: 7.431 (ref 7.350–7.450)
PO2 ART: 67 mmHg — AB (ref 83.0–108.0)
Patient temperature: 98.6
TCO2: 34 mmol/L — ABNORMAL HIGH (ref 22–32)

## 2017-08-23 LAB — URINE CULTURE: Culture: NO GROWTH

## 2017-08-23 LAB — MAGNESIUM: MAGNESIUM: 2.4 mg/dL (ref 1.7–2.4)

## 2017-08-23 MED ORDER — DIATRIZOATE MEGLUMINE & SODIUM 66-10 % PO SOLN
90.0000 mL | Freq: Once | ORAL | Status: AC
  Administered 2017-08-23: 90 mL via NASOGASTRIC
  Filled 2017-08-23: qty 90

## 2017-08-23 MED ORDER — DEXTROSE-NACL 5-0.45 % IV SOLN
INTRAVENOUS | Status: DC
  Administered 2017-08-23: 16:00:00 via INTRAVENOUS
  Filled 2017-08-23: qty 1000

## 2017-08-23 MED ORDER — DEXTROSE-NACL 5-0.45 % IV SOLN
INTRAVENOUS | Status: DC
  Administered 2017-08-23 – 2017-08-25 (×4): via INTRAVENOUS
  Administered 2017-08-25: 1 mL via INTRAVENOUS
  Administered 2017-08-26 – 2017-08-28 (×3): via INTRAVENOUS

## 2017-08-23 MED ORDER — INSULIN ASPART 100 UNIT/ML ~~LOC~~ SOLN
0.0000 [IU] | SUBCUTANEOUS | Status: DC
  Administered 2017-08-25 – 2017-08-29 (×13): 3 [IU] via SUBCUTANEOUS
  Administered 2017-08-29 – 2017-08-30 (×6): 4 [IU] via SUBCUTANEOUS
  Administered 2017-08-30: 3 [IU] via SUBCUTANEOUS
  Administered 2017-08-30: 4 [IU] via SUBCUTANEOUS
  Administered 2017-08-31: 3 [IU] via SUBCUTANEOUS
  Administered 2017-08-31 (×2): 4 [IU] via SUBCUTANEOUS
  Administered 2017-08-31: 3 [IU] via SUBCUTANEOUS
  Administered 2017-08-31 – 2017-09-02 (×11): 4 [IU] via SUBCUTANEOUS
  Administered 2017-09-02: 7 [IU] via SUBCUTANEOUS
  Administered 2017-09-02: 11 [IU] via SUBCUTANEOUS
  Administered 2017-09-02: 4 [IU] via SUBCUTANEOUS
  Administered 2017-09-03: 3 [IU] via SUBCUTANEOUS

## 2017-08-23 MED ORDER — PANTOPRAZOLE SODIUM 40 MG IV SOLR
40.0000 mg | Freq: Every day | INTRAVENOUS | Status: DC
  Administered 2017-08-23 – 2017-09-03 (×12): 40 mg via INTRAVENOUS
  Filled 2017-08-23 (×12): qty 40

## 2017-08-23 MED ORDER — INSULIN ASPART 100 UNIT/ML ~~LOC~~ SOLN: 0.0000 [IU] | Freq: Three times a day (TID) | SUBCUTANEOUS | Status: DC

## 2017-08-23 MED ORDER — MORPHINE SULFATE (PF) 4 MG/ML IV SOLN
2.0000 mg | INTRAVENOUS | Status: DC | PRN
  Administered 2017-08-25: 4 mg via INTRAVENOUS
  Filled 2017-08-23: qty 1

## 2017-08-23 MED ORDER — PIPERACILLIN-TAZOBACTAM 3.375 G IVPB
3.3750 g | Freq: Three times a day (TID) | INTRAVENOUS | Status: DC
  Administered 2017-08-24: 3.375 g via INTRAVENOUS
  Filled 2017-08-23 (×2): qty 50

## 2017-08-23 MED ORDER — DEXTROSE 50 % IV SOLN
INTRAVENOUS | Status: AC
  Administered 2017-08-23: 50 mL
  Filled 2017-08-23: qty 50

## 2017-08-23 MED ORDER — LEVOTHYROXINE SODIUM 100 MCG IV SOLR
44.0000 ug | Freq: Every day | INTRAVENOUS | Status: DC
  Administered 2017-08-23 – 2017-09-03 (×12): 44 ug via INTRAVENOUS
  Filled 2017-08-23 (×14): qty 5

## 2017-08-23 NOTE — Progress Notes (Signed)
Patient was downgraded to Step Down  CCM will sign off , call with question  K and Cr are improving

## 2017-08-23 NOTE — ED Notes (Signed)
Levophed remains off, MAP 69

## 2017-08-23 NOTE — ED Notes (Signed)
Spoke with Dr. Ninfa Linden regarding improvement in BP management, pt has been off levophed for 7 hours and per nursing assessment does not need ICU. Per Dr. Ninfa Linden, he is deferring to dayshift team to see pt and downgrade. Pt made aware of plans.

## 2017-08-23 NOTE — ED Notes (Signed)
Ordered CC hospital bed

## 2017-08-23 NOTE — ED Notes (Signed)
Patient states she is feeling much better denies abd, pain at present.

## 2017-08-23 NOTE — Progress Notes (Signed)
Assessment/Plan  1.  AKI on CKD III: Recurrent AKI due to volume depletion.  Fortunately improving with IVFs.   2.  Hyperkalemia: improved 3.  Bowel obstruction: NGT inserted, surgery following 4.  Septic shock: improved  Subjective: Interval History:feels better   Objective: Vital signs in last 24 hours: Temp:  [96.2 F (35.7 C)-98.5 F (36.9 C)] 98.5 F (36.9 C) (01/30 1431) Pulse Rate:  [69-86] 81 (01/30 1431) Resp:  [10-20] 16 (01/30 1431) BP: (94-150)/(42-95) 129/51 (01/30 1431) SpO2:  [89 %-100 %] 97 % (01/30 1431) Weight:  [98.1 kg (216 lb 4.3 oz)] 98.1 kg (216 lb 4.3 oz) (01/30 1431) Weight change:   Intake/Output from previous day: 01/29 0701 - 01/30 0700 In: 7210 [I.V.:2000; IV Piggyback:5210] Out: 1760 [Urine:960; Emesis/NG output:800] Intake/Output this shift: Total I/O In: 0  Out: 1000 [Urine:1000]  General appearance: alert and cooperative GI: protuberant and tympanetic esp on right Extremities: edema 1+  Lab Results: Recent Labs    08/22/17 1053 08/22/17 1140 08/23/17 0342  WBC 9.7  --  4.7  HGB 12.0 12.2 10.2*  HCT 37.4 36.0 32.8*  PLT 295  --  208   BMET:  Recent Labs    08/22/17 1556 08/23/17 0342  NA 136 140  139  K 5.8* 4.8  4.7  CL 104 103  102  CO2 23 28  27   GLUCOSE 76 60*  59*  BUN 66* 49*  51*  CREATININE 3.36* 2.46*  2.41*  CALCIUM 8.4* 8.1*  8.0*   No results for input(s): PTH in the last 72 hours. Iron Studies: No results for input(s): IRON, TIBC, TRANSFERRIN, FERRITIN in the last 72 hours. Studies/Results: Ct Abdomen Pelvis Wo Contrast  Result Date: 08/22/2017 CLINICAL DATA:  Abdominal distension, history of recent colostomy reversal EXAM: CT ABDOMEN AND PELVIS WITHOUT CONTRAST TECHNIQUE: Multidetector CT imaging of the abdomen and pelvis was performed following the standard protocol without IV contrast. COMPARISON:  01/27/2017 FINDINGS: Lower chest: Left basilar atelectasis is noted. No sizable effusion is  seen. Hepatobiliary: Liver is well visualized and within normal limits. Gallbladder is well distended with dependent small gallstones. Pancreas: Unremarkable. No pancreatic ductal dilatation or surrounding inflammatory changes. Spleen: Normal in size without focal abnormality. Adrenals/Urinary Tract: Bladder is partially distended. Kidneys are well visualized bilaterally without renal calculi or urinary tract obstructive changes. The adrenals are unremarkable. Stomach/Bowel: There are changes consistent with the recent history of colostomy reversal. The residual colon is dilated with fluid and multiple dilated loops of small bowel are identified consistent with small bowel obstruction. This is likely related to narrowing at the site of anastomosis in the right lower quadrant of the transverse colon to the sigmoid colon. Vascular/Lymphatic: Aortic atherosclerosis. No enlarged abdominal or pelvic lymph nodes. Reproductive: Status post hysterectomy. No adnexal masses. Other: No abdominal wall hernia or abnormality. No abdominopelvic ascites. Musculoskeletal: Degenerative changes of lumbar spine are noted. IMPRESSION: Changes consistent with obstruction at the anastomotic site of the recent colon to colon anastomosis with colonic dilatation and small-bowel dilatation Chronic changes as described. Electronically Signed   By: Inez Catalina M.D.   On: 08/22/2017 13:10   Dg Chest Portable 1 View  Result Date: 08/22/2017 CLINICAL DATA:  67 year old female with a history of nausea and vomiting for 5 days EXAM: PORTABLE CHEST 1 VIEW COMPARISON:  08/11/2017 FINDINGS: Cardiomediastinal silhouette unchanged, with cardiomegaly. Cardiac pacing device/AICD on the left chest wall, unchanged. No confluent airspace disease, pneumothorax, or pleural effusion. IMPRESSION: No evidence of acute cardiopulmonary  disease. Unchanged cardiac pacing device/AICD Electronically Signed   By: Corrie Mckusick D.O.   On: 08/22/2017 12:09   Dg Abd  Portable 1v-small Bowel Obstruction Protocol-initial, 8 Hr Delay  Result Date: 08/23/2017 CLINICAL DATA:  Small bowel obstruction, lower abdominal pain. EXAM: PORTABLE ABDOMEN - 1 VIEW COMPARISON:  08/22/2017 and CT abdomen pelvis 08/22/2017. FINDINGS: Persistent small bowel dilatation. Dilute contrast is seen within the bowel. Difficult to exclude extraluminal oral contrast. Image quality is degraded by body habitus. IMPRESSION: 1. Gaseous distention of small bowel persists, consistent with the given history of small bowel obstruction. 2. Dilute contrast within the bowel. Difficult to definitively exclude extraluminal contrast/leak. Image quality is degraded by body habitus. Electronically Signed   By: Lorin Picket M.D.   On: 08/23/2017 11:53   Dg Abd Portable 1 View  Result Date: 08/22/2017 CLINICAL DATA:  NG tube placement. EXAM: PORTABLE ABDOMEN - 1 VIEW COMPARISON:  CT, 08/22/2017 FINDINGS: NG tube passes below the diaphragm. Tip and side hole lie within the stomach, tip projecting in the mid stomach. IMPRESSION: NG tube tip lies in the mid stomach. Electronically Signed   By: Lajean Manes M.D.   On: 08/22/2017 18:24    Scheduled: . insulin aspart  0-20 Units Subcutaneous Q4H  . levothyroxine  44 mcg Intravenous Daily  . pantoprazole (PROTONIX) IV  40 mg Intravenous QHS    LOS: 1 day   Estanislado Emms 08/23/2017,5:43 PM

## 2017-08-23 NOTE — Progress Notes (Signed)
Patient ID: Crystal [name], female   DOB: [birth year], 67 y.o.   MRN: [id number]  Progress Note: General Surgery Service   Assessment/Plan: Patient Active Problem List   Diagnosis Date Noted  . Colonic obstruction ([place]) 08/22/2017  . Dehydration   . Intractable vomiting with nausea   . Hyperkalemia   . Gastroenteritis 08/11/2017  . Hypotension 08/11/2017  . AKI (acute kidney injury) ([place]) 08/11/2017  . Hypovolemia 08/10/2017  . Colostomy in place [facility]) 07/13/2017  . Adjustment disorder with depressed mood   . Debilitated 02/06/2017  . S/P colostomy ([place]) 01/31/2017  . Pressure injury of skin 01/28/2017  . Colitis 01/26/2017  . Acute MI ([place]) 05/05/2014  . Anemia, iron deficiency 05/05/2014  . Airway hyperreactivity 05/05/2014  . Diabetes mellitus, type 2 ([place]) 05/05/2014  . Chronic systolic heart failure ([place]) 05/05/2014  . HLD (hyperlipidemia) 05/05/2014  . Disease of thyroid gland 05/05/2014  . Nonischemic cardiomyopathy ([place]) 02/02/2014  . LBBB (left bundle branch block) 02/02/2014  . Chronic systolic dysfunction of left ventricle 02/02/2014  . Chronic renal insufficiency 02/02/2014  . Essential hypertension 02/02/2014  . Morbid obesity ([place]) 02/02/2014  . History of prolonged Q-T interval on ECG 10/31/2012  . Automatic implantable cardioverter-defibrillator in situ 08/06/2008   s/p    Off levophed, feeling better, 840ml out NG, Cr improving -transfer to floor   LOS: 1 day  Chief Complaint/Subjective: Pain resolved, no nausea, no flatus or BM  Objective: Vital signs in last 24 hours: Temp:  [96.2 F (35.7 C)-99.5 F (37.5 C)] 96.2 F (35.7 C) (01/30 0245) Pulse Rate:  [66-80] 74 (01/30 0700) Resp:  [10-28] 11 (01/30 0700) BP: (75-125)/(36-68) 120/54 (01/30 0700) SpO2:  [89 %-100 %] 94 % (01/30 0700) Weight:  [92.1 kg (203 lb)] 92.1 kg (203 lb) (01/29 1056)    Intake/Output from previous day: 01/29 0701 - 01/30 0700 In: 7210 [I.V.:2000; IV  Piggyback:5210] Out: 1760 [Urine:960; Emesis/NG output:800] Intake/Output this shift: No intake/output data recorded.  Lungs: CTAB  Cardiovascular: RRR  Abd: soft, NT, obesity with small distension  Extremities: no edema  Neuro: AOx4  Lab Results: CBC  Recent Labs    08/22/17 1053 08/22/17 1140 08/23/17 0342  WBC 9.7  --  4.7  HGB 12.0 12.2 10.2*  HCT 37.4 36.0 32.8*  PLT 295  --  208   BMET Recent Labs    08/22/17 1556 08/23/17 0342  NA 136 140  139  K 5.8* 4.8  4.7  CL 104 103  102  CO2 23 28  27   GLUCOSE 76 60*  59*  BUN 66* 49*  51*  CREATININE 3.36* 2.46*  2.41*  CALCIUM 8.4* 8.1*  8.0*   PT/INR Recent Labs    08/23/17 0342  LABPROT 14.2  INR 1.11   ABG Recent Labs    08/23/17 0524  PHART 7.431  HCO3 32.8*    Studies/Results:  Anti-infectives: Anti-infectives (From admission, onward)   Start     Dose/Rate Route Frequency Ordered Stop   08/24/17 1300  vancomycin (VANCOCIN) IVPB 1000 mg/200 mL premix  Status:  Discontinued     1,000 mg 200 mL/hr over 60 Minutes Intravenous Every 48 hours 08/22/17 1250 08/22/17 2103   08/22/17 1830  piperacillin-tazobactam (ZOSYN) IVPB 2.25 g     2.25 g 100 mL/hr over 30 Minutes Intravenous Every 8 hours 08/22/17 1250     08/22/17 1245  piperacillin-tazobactam (ZOSYN) IVPB 3.375 g     3.375 g 100 mL/hr over 30 Minutes  Intravenous  Once 08/22/17 1231 08/22/17 1305   08/22/17 1245  vancomycin (VANCOCIN) 2,000 mg in sodium chloride 0.9 % 500 mL IVPB  Status:  Discontinued     2,000 mg 250 mL/hr over 120 Minutes Intravenous  Once 08/22/17 1233 08/22/17 2103      Medications: Scheduled Meds: . insulin aspart  0-20 Units Subcutaneous TID WC  . levothyroxine  44 mcg Intravenous Daily  . pantoprazole (PROTONIX) IV  40 mg Intravenous QHS   Continuous Infusions: . piperacillin-tazobactam (ZOSYN)  IV Stopped (08/23/17 0335)  .  sodium bicarbonate (isotonic) infusion in sterile water 150 mL/hr at  08/23/17 0650  . sodium chloride 0.9 % 1,000 mL infusion Stopped (08/22/17 2301)   PRN Meds:.morphine injection, ondansetron **OR** ondansetron (ZOFRAN) IV  Mickeal Skinner, MD Pg# [phone] [facility]

## 2017-08-23 NOTE — ED Notes (Signed)
Called Dr. Ninfa Linden to see if patient can go to other ICU bed - pt holding for 4N, defer to Dr. Hulen Skains

## 2017-08-24 ENCOUNTER — Inpatient Hospital Stay (HOSPITAL_COMMUNITY): Payer: Medicare Other

## 2017-08-24 ENCOUNTER — Encounter (HOSPITAL_COMMUNITY): Payer: Self-pay

## 2017-08-24 ENCOUNTER — Encounter (HOSPITAL_COMMUNITY)
Admission: EM | Disposition: A | Discharge status: Home / Self Care | Payer: Self-pay | Source: Home / Self Care | Attending: General Surgery

## 2017-08-24 HISTORY — PX: FLEXIBLE SIGMOIDOSCOPY: SHX5431

## 2017-08-24 LAB — GLUCOSE, CAPILLARY
GLUCOSE-CAPILLARY: 89 mg/dL (ref 65–99)
GLUCOSE-CAPILLARY: 97 mg/dL (ref 65–99)
GLUCOSE-CAPILLARY: 105 mg/dL — AB (ref 65–99)
Glucose-Capillary: 89 mg/dL (ref 65–99)
Glucose-Capillary: 86 mg/dL (ref 65–99)
Glucose-Capillary: 99 mg/dL (ref 65–99)

## 2017-08-24 LAB — BASIC METABOLIC PANEL
Anion gap: 7 (ref 5–15)
BUN: 28 mg/dL — AB (ref 6–20)
CALCIUM: 8.7 mg/dL — AB (ref 8.9–10.3)
CO2: 28 mmol/L (ref 22–32)
CREATININE: 1.78 mg/dL — AB (ref 0.44–1.00)
Chloride: 104 mmol/L (ref 101–111)
GFR calc Af Amer: 33 mL/min — ABNORMAL LOW (ref >60)
GFR calc non Af Amer: 28 mL/min — ABNORMAL LOW (ref >60)
GLUCOSE: 103 mg/dL — AB (ref 65–99)
Potassium: 4.7 mmol/L (ref 3.5–5.1)
Sodium: 139 mmol/L (ref 135–145)

## 2017-08-24 LAB — CBC
HCT: 31.6 % — ABNORMAL LOW (ref 36.0–46.0)
HEMOGLOBIN: 10.1 g/dL — AB (ref 12.0–15.0)
MCH: 27.2 pg (ref 26.0–34.0)
MCHC: 32 g/dL (ref 30.0–36.0)
MCV: 84.9 fL (ref 78.0–100.0)
PLATELETS: 212 10*3/uL (ref 150–400)
RBC: 3.72 MIL/uL — ABNORMAL LOW (ref 3.87–5.11)
RDW: 15.2 % (ref 11.5–15.5)
WBC: 5 10*3/uL (ref 4.0–10.5)

## 2017-08-24 SURGERY — SIGMOIDOSCOPY, FLEXIBLE
Anesthesia: Moderate Sedation

## 2017-08-24 MED ORDER — FENTANYL CITRATE (PF) 100 MCG/2ML IJ SOLN
INTRAMUSCULAR | Status: AC
  Filled 2017-08-24: qty 2

## 2017-08-24 MED ORDER — IOPAMIDOL (ISOVUE-300) INJECTION 61%
INTRAVENOUS | Status: AC
  Filled 2017-08-24: qty 450

## 2017-08-24 MED ORDER — MIDAZOLAM HCL 5 MG/ML IJ SOLN
INTRAMUSCULAR | Status: AC
  Filled 2017-08-24: qty 2

## 2017-08-24 MED ORDER — SODIUM CHLORIDE 0.9 % IV SOLN: INTRAVENOUS | Status: DC

## 2017-08-24 NOTE — H&P (View-Only) (Signed)
GS Progress Note Subjective: Patient feeling much better, but still distended.  No distress.    Objective: Vital signs in last 24 hours: Temp:  [98.5 F (36.9 C)-99.1 F (37.3 C)] 98.9 F (37.2 C) (01/31 0430) Pulse Rate:  [72-86] 72 (01/31 0430) Resp:  [13-20] 17 (01/31 0430) BP: (119-150)/(42-95) 147/50 (01/31 0430) SpO2:  [92 %-98 %] 98 % (01/31 0430) Weight:  [98.1 kg (216 lb 4.3 oz)] 98.1 kg (216 lb 4.3 oz) (01/30 1431) Last BM Date: 08/20/17  Intake/Output from previous day: 01/30 0701 - 01/31 0700 In: 1580 [I.V.:1480; IV Piggyback:100] Out: 4200 [Urine:4200] Intake/Output this shift: No intake/output data recorded.  Lungs: Clear to auscultation.  Abd: Distended, some tinkles and rushes.  No flatus or BM  Extremities: No clinical signs or symptoms of DVT  Neuro: Intact.  Lab Results: CBC  Recent Labs    08/23/17 0342 08/24/17 0610  WBC 4.7 5.0  HGB 10.2* 10.1*  HCT 32.8* 31.6*  PLT 208 212   BMET Recent Labs    08/23/17 0342 08/24/17 0610  NA 140  139 139  K 4.8  4.7 4.7  CL 103  102 104  CO2 28  27 28   GLUCOSE 60*  59* 103*  BUN 49*  51* 28*  CREATININE 2.46*  2.41* 1.78*  CALCIUM 8.1*  8.0* 8.7*   PT/INR Recent Labs    08/23/17 0342  LABPROT 14.2  INR 1.11   ABG Recent Labs    08/23/17 0524  PHART 7.431  HCO3 32.8*    Studies/Results: Ct Abdomen Pelvis Wo Contrast  Result Date: 08/22/2017 CLINICAL DATA:  Abdominal distension, history of recent colostomy reversal EXAM: CT ABDOMEN AND PELVIS WITHOUT CONTRAST TECHNIQUE: Multidetector CT imaging of the abdomen and pelvis was performed following the standard protocol without IV contrast. COMPARISON:  01/27/2017 FINDINGS: Lower chest: Left basilar atelectasis is noted. No sizable effusion is seen. Hepatobiliary: Liver is well visualized and within normal limits. Gallbladder is well distended with dependent small gallstones. Pancreas: Unremarkable. No pancreatic ductal dilatation or  surrounding inflammatory changes. Spleen: Normal in size without focal abnormality. Adrenals/Urinary Tract: Bladder is partially distended. Kidneys are well visualized bilaterally without renal calculi or urinary tract obstructive changes. The adrenals are unremarkable. Stomach/Bowel: There are changes consistent with the recent history of colostomy reversal. The residual colon is dilated with fluid and multiple dilated loops of small bowel are identified consistent with small bowel obstruction. This is likely related to narrowing at the site of anastomosis in the right lower quadrant of the transverse colon to the sigmoid colon. Vascular/Lymphatic: Aortic atherosclerosis. No enlarged abdominal or pelvic lymph nodes. Reproductive: Status post hysterectomy. No adnexal masses. Other: No abdominal wall hernia or abnormality. No abdominopelvic ascites. Musculoskeletal: Degenerative changes of lumbar spine are noted. IMPRESSION: Changes consistent with obstruction at the anastomotic site of the recent colon to colon anastomosis with colonic dilatation and small-bowel dilatation Chronic changes as described. Electronically Signed   By: Inez Catalina M.D.   On: 08/22/2017 13:10   Dg Chest Portable 1 View  Result Date: 08/22/2017 CLINICAL DATA:  67 year old female with a history of nausea and vomiting for 5 days EXAM: PORTABLE CHEST 1 VIEW COMPARISON:  08/11/2017 FINDINGS: Cardiomediastinal silhouette unchanged, with cardiomegaly. Cardiac pacing device/AICD on the left chest wall, unchanged. No confluent airspace disease, pneumothorax, or pleural effusion. IMPRESSION: No evidence of acute cardiopulmonary disease. Unchanged cardiac pacing device/AICD Electronically Signed   By: Corrie Mckusick D.O.   On: 08/22/2017 12:09  Dg Abd Portable 1v-small Bowel Obstruction Protocol-initial, 8 Hr Delay  Result Date: 08/23/2017 CLINICAL DATA:  Small bowel obstruction, lower abdominal pain. EXAM: PORTABLE ABDOMEN - 1 VIEW  COMPARISON:  08/22/2017 and CT abdomen pelvis 08/22/2017. FINDINGS: Persistent small bowel dilatation. Dilute contrast is seen within the bowel. Difficult to exclude extraluminal oral contrast. Image quality is degraded by body habitus. IMPRESSION: 1. Gaseous distention of small bowel persists, consistent with the given history of small bowel obstruction. 2. Dilute contrast within the bowel. Difficult to definitively exclude extraluminal contrast/leak. Image quality is degraded by body habitus. Electronically Signed   By: Lorin Picket M.D.   On: 08/23/2017 11:53   Dg Abd Portable 1 View  Result Date: 08/22/2017 CLINICAL DATA:  NG tube placement. EXAM: PORTABLE ABDOMEN - 1 VIEW COMPARISON:  CT, 08/22/2017 FINDINGS: NG tube passes below the diaphragm. Tip and side hole lie within the stomach, tip projecting in the mid stomach. IMPRESSION: NG tube tip lies in the mid stomach. Electronically Signed   By: Lajean Manes M.D.   On: 08/22/2017 18:24    Anti-infectives: Anti-infectives (From admission, onward)   Start     Dose/Rate Route Frequency Ordered Stop   08/24/17 1300  vancomycin (VANCOCIN) IVPB 1000 mg/200 mL premix  Status:  Discontinued     1,000 mg 200 mL/hr over 60 Minutes Intravenous Every 48 hours 08/22/17 1250 08/22/17 2103   08/24/17 0230  piperacillin-tazobactam (ZOSYN) IVPB 3.375 g     3.375 g 12.5 mL/hr over 240 Minutes Intravenous Every 8 hours 08/23/17 2025     08/22/17 1830  piperacillin-tazobactam (ZOSYN) IVPB 2.25 g  Status:  Discontinued     2.25 g 100 mL/hr over 30 Minutes Intravenous Every 8 hours 08/22/17 1250 08/23/17 2025   08/22/17 1245  piperacillin-tazobactam (ZOSYN) IVPB 3.375 g     3.375 g 100 mL/hr over 30 Minutes Intravenous  Once 08/22/17 1231 08/22/17 1305   08/22/17 1245  vancomycin (VANCOCIN) 2,000 mg in sodium chloride 0.9 % 500 mL IVPB  Status:  Discontinued     2,000 mg 250 mL/hr over 120 Minutes Intravenous  Once 08/22/17 1233 08/22/17 2103       Assessment/Plan: s/p  d/c foley Water soluble enema  Continue NGT even though output is not high at all.  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  I do not believe that her current problem is infectious at all and I will stop the Zosyn  LOS: 2 days    Kathryne Eriksson. [name], MD, FACS [phone] [facility] 08/24/2017

## 2017-08-24 NOTE — Progress Notes (Signed)
Assessment/Plan  1. AKI on CKD III: Hemodynamically mediated.                               Improving, so we will sign off. Thanks  2. Hyperkalemia: improved 3. Bowel obstruction: NGT inserted, surgery following and feeling better 4. Septic shock: improved  Subjective: Interval History: Better  Objective: Vital signs in last 24 hours: Temp:  [98.5 F (36.9 C)-99.1 F (37.3 C)] 98.6 F (37 C) (01/31 1036) Pulse Rate:  [72-81] 80 (01/31 1036) Resp:  [13-18] 18 (01/31 1036) BP: (129-147)/(43-55) 145/55 (01/31 1036) SpO2:  [93 %-99 %] 99 % (01/31 1036) Weight:  [98.1 kg (216 lb 4.3 oz)] 98.1 kg (216 lb 4.3 oz) (01/30 1431) Weight change: 6.02 kg (13 lb 4.3 oz)  Intake/Output from previous day: 01/30 0701 - 01/31 0700 In: 1580 [I.V.:1480; IV Piggyback:100] Out: 4200 [Urine:4200] Intake/Output this shift: Total I/O In: 0  Out: 1000 [Urine:1000]  Pleasant  Abd protub Ext no signif edema   Lab Results: Recent Labs    08/23/17 0342 08/24/17 0610  WBC 4.7 5.0  HGB 10.2* 10.1*  HCT 32.8* 31.6*  PLT 208 212   BMET:  Recent Labs    08/23/17 0342 08/24/17 0610  NA 140  139 139  K 4.8  4.7 4.7  CL 103  102 104  CO2 28  27 28   GLUCOSE 60*  59* 103*  BUN 49*  51* 28*  CREATININE 2.46*  2.41* 1.78*  CALCIUM 8.1*  8.0* 8.7*   No results for input(s): PTH in the last 72 hours. Iron Studies: No results for input(s): IRON, TIBC, TRANSFERRIN, FERRITIN in the last 72 hours. Studies/Results:   LOS: 2 days   Estanislado Emms 08/24/2017,12:32 PM

## 2017-08-24 NOTE — Consult Note (Signed)
Reason for Consult: Anastamotic stricture Referring Physician: Doreen Salvage, M.D.  Dani Gobble HPI: This is a 67 year old female with multiple medical problems admitted for dehydration, nausea, vomiting, and abdominal pain.  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  Dr. Hulen Skains took down the ostomy on 07/13/2017 for subtotal colectomy as a result of C. Diff.  The single contrast BE showed a high grade stricture at the anastamosis.  Past Medical History:  Diagnosis Date  . AICD (automatic cardioverter/defibrillator) present    high RV threshold chronically, device was turned off in 2014; Device battery has been dead x 7 years  . Anemia, iron deficiency   . Angioedema    felt to likely be due to ace inhibitors but says she has had this even off of medicines, appears to be tolerating ARBs chronically  . Anxiety   . Arthritis    "hands, legs, arms" (07/13/2017)  . Asthmatic bronchitis with status asthmaticus   . Bradycardia   . CHF (congestive heart failure) ([place])   . Chronic lower back pain   . Chronic pain   . Chronic renal insufficiency   . CKD (chronic kidney disease), stage III ([place])   . Coronary artery disease    Mild nonobstructive (30% LAD, 30% RCA) by 09/01/16 cath at [facility]  . Degenerative joint disease   . Depression   . Diabetes mellitus, type 2 ([place])   . Diabetic peripheral neuropathy ([place])   . Dizziness   . Dyspnea on exertion   . Family history of adverse reaction to anesthesia    Mother has nausea  . GERD (gastroesophageal reflux disease)   . Gout   . Heart valve disorder   . History of blood transfusion 1981; ~ 2005; 12/2016   "childbirth; defibrillator OR; colostomy OR"  . History of mononucleosis 03/2014  . Hyperlipidemia   . Hypertension   . Hypothyroid   . Insomnia   . Intervertebral disc degeneration   . Ischemic cardiomyopathy   . LBBB (left bundle branch block)   . Myocardial infarction ([place]) dx'd ~ 2005  . Nonischemic  cardiomyopathy ([place])    s/p ICD in 2005. EF has since recovered  . Obesity   . On home oxygen therapy    "2L at night" (07/13/2017)  . OSA on CPAP   . Pneumonia    "couple times; last time was 12/2016" (07/13/2017)  . PONV (postoperative nausea and vomiting)   . Swelling   . Syncope   . Systemic hypertension   . Vitamin D deficiency     Past Surgical History:  Procedure Laterality Date  . APPENDECTOMY  07/13/2017  . BLADDER SUSPENSION  1990   "w/hysterectomy"  . CARDIAC CATHETERIZATION  2005   no obstructive CAD per patient  . CARDIAC CATHETERIZATION  2018  . CARDIAC DEFIBRILLATOR PLACEMENT  2005   BiV ICD implanted,  LV lead is an epicardial lead  . CARPAL TUNNEL RELEASE Left 07/25/2014   Dr.Williamson   . CATARACT EXTRACTION W/ INTRAOCULAR LENS  IMPLANT, BILATERAL Bilateral   . COLONOSCOPY     2010-2011 Dr.Kipreos   . COLOSTOMY  12/2016   Archie Endo 01/26/2017  . COLOSTOMY REVERSAL  07/13/2017  . COLOSTOMY REVERSAL N/A 07/13/2017   Procedure: COLOSTOMY REVERSAL;  Surgeon: Judeth Horn, MD;  Location: [place];  Service: General;  Laterality: N/A;  . CYST REMOVAL HAND Right 05/2013   thumb  . DILATION AND CURETTAGE OF UTERUS  X 5-6  . IMPLANTABLE CARDIOVERTER  DEFIBRILLATOR GENERATOR CHANGE  2008  . TOTAL ABDOMINAL HYSTERECTOMY  1990  . TRIGGER FINGER RELEASE Right 05/213  . TUBAL LIGATION  1980s  . VESICOVAGINAL FISTULA CLOSURE W/ TAH      Family History  Problem Relation Age of Onset  . Hypertension Father   . Heart disease Father   . Cancer Father        prostate  . Heart disease Other        (Maternal side) Ischemic heart disease  . Diabetes Mellitus II Other   . Arrhythmia Mother   . Diabetes Mellitus II Mother        Borderline DM  . Hypertension Mother   . Asthma Mother   . Cancer Brother   . Dementia Brother   . Hypertension Brother   . Hypertension Daughter   . Hypertension Daughter   . Diabetes Daughter   . Hypertension Daughter   . Atrial  fibrillation Daughter   . GER disease Daughter   . Hypertension Son   . Anxiety disorder Son   . Hypothyroidism Son     Social History:  reports that  has never smoked. she has never used smokeless tobacco. She reports that she does not drink alcohol or use drugs.  Allergies:  Allergies  Allergen Reactions  . Glucophage [Metformin Hcl] Other (See Comments)    Renal failure  . Ace Inhibitors Swelling and Other (See Comments)    Angioedema  . Advicor [Niacin-Lovastatin Er] Other (See Comments)    Muscle aches  . Bystolic [Nebivolol Hcl] Other (See Comments)    Edema  . Erythromycin Diarrhea, Nausea And Vomiting and Other (See Comments)    *DERIVATIVES*  . Lipitor [Atorvastatin] Other (See Comments)    Muscle aches  . Lopid [Gemfibrozil] Other (See Comments)    Muscle aches  . Statins Other (See Comments)    Muscle aches  . Adhesive [Tape] Rash    Medications:  Scheduled: . insulin aspart  0-20 Units Subcutaneous Q4H  . levothyroxine  44 mcg Intravenous Daily  . pantoprazole (PROTONIX) IV  40 mg Intravenous QHS   Continuous: . dextrose 5 % and 0.45% NaCl 100 mL/hr at 08/24/17 1451    Results for orders placed or performed during the hospital encounter of 08/22/17 (from the past 24 hour(s))  Glucose, capillary     Status: None   Collection Time: 08/24/17  1:13 AM  Result Value Ref Range   Glucose-Capillary 89 65 - 99 mg/dL   Comment 1 Notify RN    Comment 2 Document in Chart   Glucose, capillary     Status: None   Collection Time: 08/24/17  4:33 AM  Result Value Ref Range   Glucose-Capillary 97 65 - 99 mg/dL   Comment 1 Notify RN    Comment 2 Document in Chart   Basic metabolic panel     Status: Abnormal   Collection Time: 08/24/17  6:10 AM  Result Value Ref Range   Sodium 139 135 - 145 mmol/L   Potassium 4.7 3.5 - 5.1 mmol/L   Chloride 104 101 - 111 mmol/L   CO2 28 22 - 32 mmol/L   Glucose, Bld 103 (H) 65 - 99 mg/dL   BUN 28 (H) 6 - 20 mg/dL   Creatinine,  Ser 1.78 (H) 0.44 - 1.00 mg/dL   Calcium 8.7 (L) 8.9 - 10.3 mg/dL   GFR calc non Af Amer 28 (L) >60 mL/min   GFR calc Af Amer 33 (L) >60 mL/min  Anion gap 7 5 - 15  CBC     Status: Abnormal   Collection Time: 08/24/17  6:10 AM  Result Value Ref Range   WBC 5.0 4.0 - 10.5 K/uL   RBC 3.72 (L) 3.87 - 5.11 MIL/uL   Hemoglobin 10.1 (L) 12.0 - 15.0 g/dL   HCT 31.6 (L) 36.0 - 46.0 %   MCV 84.9 78.0 - 100.0 fL   MCH 27.2 26.0 - 34.0 pg   MCHC 32.0 30.0 - 36.0 g/dL   RDW 15.2 11.5 - 15.5 %   Platelets 212 150 - 400 K/uL  Glucose, capillary     Status: None   Collection Time: 08/24/17  7:48 AM  Result Value Ref Range   Glucose-Capillary 89 65 - 99 mg/dL  Glucose, capillary     Status: Abnormal   Collection Time: 08/24/17 12:20 PM  Result Value Ref Range   Glucose-Capillary 105 (H) 65 - 99 mg/dL  Glucose, capillary     Status: None   Collection Time: 08/24/17  5:05 PM  Result Value Ref Range   Glucose-Capillary 86 65 - 99 mg/dL  Glucose, capillary     Status: None   Collection Time: 08/24/17  8:08 PM  Result Value Ref Range   Glucose-Capillary 99 65 - 99 mg/dL   Comment 1 Notify RN    Comment 2 Document in Chart      Dg Abd Portable 1v-small Bowel Obstruction Protocol-initial, 8 Hr Delay  Result Date: 08/23/2017 CLINICAL DATA:  Small bowel obstruction, lower abdominal pain. EXAM: PORTABLE ABDOMEN - 1 VIEW COMPARISON:  08/22/2017 and CT abdomen pelvis 08/22/2017. FINDINGS: Persistent small bowel dilatation. Dilute contrast is seen within the bowel. Difficult to exclude extraluminal oral contrast. Image quality is degraded by body habitus. IMPRESSION: 1. Gaseous distention of small bowel persists, consistent with the given history of small bowel obstruction. 2. Dilute contrast within the bowel. Difficult to definitively exclude extraluminal contrast/leak. Image quality is degraded by body habitus. Electronically Signed   By: Lorin Picket M.D.   On: 08/23/2017 11:53   Dg Colon W/water  Sol Cm  Result Date: 08/24/2017 CLINICAL DATA:  Colostomy reversal 07/13/2017. Recent CT demonstrating apparent obstruction at the colon/colon anastomosis. EXAM: WATER SOLUBLE CONTRAST ENEMA TECHNIQUE: Routine single contrast water soluble enema performed. FLUOROSCOPY TIME:  Fluoroscopy Time:  2 minutes and 36 seconds Radiation Exposure Index (if provided by the fluoroscopic device): Not applicable. Number of Acquired Spot Images: 3 COMPARISON:  CT of 08/22/2017 FINDINGS: Preprocedure scout film demonstrates probable contrast in pelvic bowel loops. Dilated abdominal bowel loops. Administration of contrast demonstrates normal caliber of the rectum and sigmoid. Despite multiple positional maneuvers, contrast does not pass the colon to colon anastomosis site in the right lower quadrant. Post evacuation image demonstrates a moderate amount of retained contrast within the sigmoid. IMPRESSION: Colonic obstruction within the right lower quadrant, at the site of colonic to colonic anastomosis on prior CT. Electronically Signed   By: Abigail Miyamoto M.D.   On: 08/24/2017 09:44    ROS:  As stated above in the HPI otherwise negative.  Blood pressure (!) 169/69, pulse 85, temperature 98.2 F (36.8 C), temperature source Oral, resp. rate 17, height 5\' 5"  (1.651 m), weight 98.1 kg (216 lb 4.3 oz), SpO2 99 %.    PE: Gen: NAD, Alert and Oriented HEENT:  [place]/AT, EOMI Neck: Supple, no LAD Lungs: CTA Bilaterally CV: RRR without M/G/R ABM: distended, nontender, tympanic Ext: No C/C/E  Assessment/Plan: 1) Anastamotic stricture -  FFS with dilation today.  Koraline Phillipson D 08/24/2017, 9:49 PM

## 2017-08-24 NOTE — Progress Notes (Signed)
Crystal [name] Visited Pt. While rounding this evening. Pt. Is supported by a large family. Crystal offered prayer and ministry of comfort. Pt. Was happy to have her family present during her hospital stay and expressed gratitude for prayer.

## 2017-08-24 NOTE — Op Note (Signed)
[facility] Patient Name: Crystal [name] Procedure Date : 08/24/2017 MRN: [id number] Attending MD: Carol Ada , MD Date of Birth: [birth year] CSN: [id number] Age: 67 Admit Type: Inpatient Procedure:                Flexible Sigmoidoscopy Indications:              Abnormal barium enema Providers:                Carol Ada, MD, Cleda Daub, RN, Elspeth Cho                            Tech., Technician Referring MD:              Medicines:                None Complications:            No immediate complications. Estimated Blood Loss:     Estimated blood loss was minimal. Procedure:                Pre-Anesthesia Assessment:                           - Prior to the procedure, a History and Physical                            was performed, and patient medications and                            allergies were reviewed. The patient's tolerance of                            previous anesthesia was also reviewed. The risks                            and benefits of the procedure and the sedation                            options and risks were discussed with the patient.                            All questions were answered, and informed consent                            was obtained. Prior Anticoagulants: The patient has                            taken no previous anticoagulant or antiplatelet                            agents. ASA Grade Assessment: II - A patient with                            mild systemic disease. After reviewing the risks  and benefits, the patient was deemed in                            satisfactory condition to undergo the procedure.                           After obtaining informed consent, the scope was                            passed under direct vision. The [id number] ([id number])                            scope was introduced through the anus and advanced                            to the the ileo-sigmoid  anastomosis. The flexible                            sigmoidoscopy was accomplished without difficulty.                            The patient tolerated the procedure well. The                            quality of the bowel preparation was good. Scope In: Scope Out: Findings:      A benign-appearing, intrinsic severe stenosis measuring 1 cm (in length)       x 9 mm (inner diameter) was found at the anastomosis and was       non-traversed. A TTS dilator was passed through the scope. Dilation with       a 13.5 mm colonic balloon dilator was performed. The dilation site was       examined and showed mild improvement in luminal narrowing. Estimated       blood loss was minimal.      Using a column of water the endoscope was gentley advanced through the       anastamotic stricture. There was friability with this area. The       endoscope was able to "slip" through the stricture with mild pressure.       Stool and a dilated colon was encountered on the other side of the       stricture. With direct visualization the 10-12 mm balloon was placed       across the stricture. At 11 and 12 mm the balloon moved freely. Dilation       was then performed at 13.5 mm and the balloon was held in place for 1       minute. Post dilation, the area was still stenosed and there was no       spontaneous movement of stool or gas through the area. Moving through       the stricture with the endoscope was easier. Impression:               - Stricture at the colonic anastomosis. Dilated.                           - No specimens collected. Recommendation:           -  Return patient to hospital ward for ongoing care.                           - NPO. Procedure Code(s):        --- Professional ---                           [phone], Sigmoidoscopy, flexible; with                            transendoscopic balloon dilation Diagnosis Code(s):        --- Professional ---                           K56.69, Other intestinal  obstruction                           R93.3, Abnormal findings on diagnostic imaging of                            other parts of digestive tract CPT copyright 2016 [employer]. All rights reserved. The codes documented in this report are preliminary and upon coder review may  be revised to meet current compliance requirements. Carol Ada, MD Carol Ada, MD 08/24/2017 4:34:50 PM This report has been signed electronically. Number of Addenda: 0

## 2017-08-24 NOTE — Progress Notes (Addendum)
GS Progress Note Subjective: Patient feeling much better, but still distended.  No distress.    Objective: Vital signs in last 24 hours: Temp:  [98.5 F (36.9 C)-99.1 F (37.3 C)] 98.9 F (37.2 C) (01/31 0430) Pulse Rate:  [72-86] 72 (01/31 0430) Resp:  [13-20] 17 (01/31 0430) BP: (119-150)/(42-95) 147/50 (01/31 0430) SpO2:  [92 %-98 %] 98 % (01/31 0430) Weight:  [98.1 kg (216 lb 4.3 oz)] 98.1 kg (216 lb 4.3 oz) (01/30 1431) Last BM Date: 08/20/17  Intake/Output from previous day: 01/30 0701 - 01/31 0700 In: 1580 [I.V.:1480; IV Piggyback:100] Out: 4200 [Urine:4200] Intake/Output this shift: No intake/output data recorded.  Lungs: Clear to auscultation.  Abd: Distended, some tinkles and rushes.  No flatus or BM  Extremities: No clinical signs or symptoms of DVT  Neuro: Intact.  Lab Results: CBC  Recent Labs    08/23/17 0342 08/24/17 0610  WBC 4.7 5.0  HGB 10.2* 10.1*  HCT 32.8* 31.6*  PLT 208 212   BMET Recent Labs    08/23/17 0342 08/24/17 0610  NA 140  139 139  K 4.8  4.7 4.7  CL 103  102 104  CO2 28  27 28   GLUCOSE 60*  59* 103*  BUN 49*  51* 28*  CREATININE 2.46*  2.41* 1.78*  CALCIUM 8.1*  8.0* 8.7*   PT/INR Recent Labs    08/23/17 0342  LABPROT 14.2  INR 1.11   ABG Recent Labs    08/23/17 0524  PHART 7.431  HCO3 32.8*    Studies/Results: Ct Abdomen Pelvis Wo Contrast  Result Date: 08/22/2017 CLINICAL DATA:  Abdominal distension, history of recent colostomy reversal EXAM: CT ABDOMEN AND PELVIS WITHOUT CONTRAST TECHNIQUE: Multidetector CT imaging of the abdomen and pelvis was performed following the standard protocol without IV contrast. COMPARISON:  01/27/2017 FINDINGS: Lower chest: Left basilar atelectasis is noted. No sizable effusion is seen. Hepatobiliary: Liver is well visualized and within normal limits. Gallbladder is well distended with dependent small gallstones. Pancreas: Unremarkable. No pancreatic ductal dilatation or  surrounding inflammatory changes. Spleen: Normal in size without focal abnormality. Adrenals/Urinary Tract: Bladder is partially distended. Kidneys are well visualized bilaterally without renal calculi or urinary tract obstructive changes. The adrenals are unremarkable. Stomach/Bowel: There are changes consistent with the recent history of colostomy reversal. The residual colon is dilated with fluid and multiple dilated loops of small bowel are identified consistent with small bowel obstruction. This is likely related to narrowing at the site of anastomosis in the right lower quadrant of the transverse colon to the sigmoid colon. Vascular/Lymphatic: Aortic atherosclerosis. No enlarged abdominal or pelvic lymph nodes. Reproductive: Status post hysterectomy. No adnexal masses. Other: No abdominal wall hernia or abnormality. No abdominopelvic ascites. Musculoskeletal: Degenerative changes of lumbar spine are noted. IMPRESSION: Changes consistent with obstruction at the anastomotic site of the recent colon to colon anastomosis with colonic dilatation and small-bowel dilatation Chronic changes as described. Electronically Signed   By: Inez Catalina M.D.   On: 08/22/2017 13:10   Dg Chest Portable 1 View  Result Date: 08/22/2017 CLINICAL DATA:  67 year old female with a history of nausea and vomiting for 5 days EXAM: PORTABLE CHEST 1 VIEW COMPARISON:  08/11/2017 FINDINGS: Cardiomediastinal silhouette unchanged, with cardiomegaly. Cardiac pacing device/AICD on the left chest wall, unchanged. No confluent airspace disease, pneumothorax, or pleural effusion. IMPRESSION: No evidence of acute cardiopulmonary disease. Unchanged cardiac pacing device/AICD Electronically Signed   By: Corrie Mckusick D.O.   On: 08/22/2017 12:09  Dg Abd Portable 1v-small Bowel Obstruction Protocol-initial, 8 Hr Delay  Result Date: 08/23/2017 CLINICAL DATA:  Small bowel obstruction, lower abdominal pain. EXAM: PORTABLE ABDOMEN - 1 VIEW  COMPARISON:  08/22/2017 and CT abdomen pelvis 08/22/2017. FINDINGS: Persistent small bowel dilatation. Dilute contrast is seen within the bowel. Difficult to exclude extraluminal oral contrast. Image quality is degraded by body habitus. IMPRESSION: 1. Gaseous distention of small bowel persists, consistent with the given history of small bowel obstruction. 2. Dilute contrast within the bowel. Difficult to definitively exclude extraluminal contrast/leak. Image quality is degraded by body habitus. Electronically Signed   By: Lorin Picket M.D.   On: 08/23/2017 11:53   Dg Abd Portable 1 View  Result Date: 08/22/2017 CLINICAL DATA:  NG tube placement. EXAM: PORTABLE ABDOMEN - 1 VIEW COMPARISON:  CT, 08/22/2017 FINDINGS: NG tube passes below the diaphragm. Tip and side hole lie within the stomach, tip projecting in the mid stomach. IMPRESSION: NG tube tip lies in the mid stomach. Electronically Signed   By: Lajean Manes M.D.   On: 08/22/2017 18:24    Anti-infectives: Anti-infectives (From admission, onward)   Start     Dose/Rate Route Frequency Ordered Stop   08/24/17 1300  vancomycin (VANCOCIN) IVPB 1000 mg/200 mL premix  Status:  Discontinued     1,000 mg 200 mL/hr over 60 Minutes Intravenous Every 48 hours 08/22/17 1250 08/22/17 2103   08/24/17 0230  piperacillin-tazobactam (ZOSYN) IVPB 3.375 g     3.375 g 12.5 mL/hr over 240 Minutes Intravenous Every 8 hours 08/23/17 2025     08/22/17 1830  piperacillin-tazobactam (ZOSYN) IVPB 2.25 g  Status:  Discontinued     2.25 g 100 mL/hr over 30 Minutes Intravenous Every 8 hours 08/22/17 1250 08/23/17 2025   08/22/17 1245  piperacillin-tazobactam (ZOSYN) IVPB 3.375 g     3.375 g 100 mL/hr over 30 Minutes Intravenous  Once 08/22/17 1231 08/22/17 1305   08/22/17 1245  vancomycin (VANCOCIN) 2,000 mg in sodium chloride 0.9 % 500 mL IVPB  Status:  Discontinued     2,000 mg 250 mL/hr over 120 Minutes Intravenous  Once 08/22/17 1233 08/22/17 2103       Assessment/Plan: s/p  d/c foley Water soluble enema  Continue NGT even though output is not high at all.  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  I do not believe that her current problem is infectious at all and I will stop the Zosyn  LOS: 2 days    Kathryne Eriksson. [name], MD, FACS [phone] [facility] Surgery 08/24/2017

## 2017-08-24 NOTE — Interval H&P Note (Signed)
History and Physical Interval Note:  08/24/2017 4:01 PM  Crystal [name]  has presented today for surgery, with the diagnosis of Anastamotic stricture  The various methods of treatment have been discussed with the patient and family. After consideration of risks, benefits and other options for treatment, the patient has consented to  Procedure(s): FLEXIBLE SIGMOIDOSCOPY (N/A) as a surgical intervention .  The patient's history has been reviewed, patient examined, no change in status, stable for surgery.  I have reviewed the patient's chart and labs.  Questions were answered to the patient's satisfaction.     Azel Gumina D

## 2017-08-25 ENCOUNTER — Encounter (HOSPITAL_COMMUNITY): Payer: Self-pay | Admitting: Gastroenterology

## 2017-08-25 LAB — BASIC METABOLIC PANEL
ANION GAP: 9 (ref 5–15)
BUN: 18 mg/dL (ref 6–20)
CO2: 23 mmol/L (ref 22–32)
Calcium: 9 mg/dL (ref 8.9–10.3)
Chloride: 105 mmol/L (ref 101–111)
Creatinine, Ser: 1.48 mg/dL — ABNORMAL HIGH (ref 0.44–1.00)
GFR, EST AFRICAN AMERICAN: 41 mL/min — AB (ref >60)
GFR, EST NON AFRICAN AMERICAN: 35 mL/min — AB (ref >60)
Glucose, Bld: 115 mg/dL — ABNORMAL HIGH (ref 65–99)
POTASSIUM: 4.3 mmol/L (ref 3.5–5.1)
SODIUM: 137 mmol/L (ref 135–145)

## 2017-08-25 LAB — GLUCOSE, CAPILLARY
GLUCOSE-CAPILLARY: 125 mg/dL — AB (ref 65–99)
GLUCOSE-CAPILLARY: 111 mg/dL — AB (ref 65–99)
GLUCOSE-CAPILLARY: 138 mg/dL — AB (ref 65–99)
Glucose-Capillary: 109 mg/dL — ABNORMAL HIGH (ref 65–99)
Glucose-Capillary: 113 mg/dL — ABNORMAL HIGH (ref 65–99)
Glucose-Capillary: 119 mg/dL — ABNORMAL HIGH (ref 65–99)

## 2017-08-25 LAB — CBC WITH DIFFERENTIAL/PLATELET
BASOS ABS: 0 10*3/uL (ref 0.0–0.1)
Basophils Relative: 0 %
EOS ABS: 0.2 10*3/uL (ref 0.0–0.7)
Eosinophils Relative: 3 %
HCT: 31.1 % — ABNORMAL LOW (ref 36.0–46.0)
Hemoglobin: 9.9 g/dL — ABNORMAL LOW (ref 12.0–15.0)
Lymphocytes Relative: 32 %
Lymphs Abs: 1.7 10*3/uL (ref 0.7–4.0)
MCH: 26.8 pg (ref 26.0–34.0)
MCHC: 31.8 g/dL (ref 30.0–36.0)
MCV: 84.3 fL (ref 78.0–100.0)
Monocytes Absolute: 0.5 10*3/uL (ref 0.1–1.0)
Monocytes Relative: 10 %
NEUTROS PCT: 55 %
Neutro Abs: 2.8 10*3/uL (ref 1.7–7.7)
PLATELETS: 202 10*3/uL (ref 150–400)
RBC: 3.69 MIL/uL — AB (ref 3.87–5.11)
RDW: 15 % (ref 11.5–15.5)
WBC: 5.2 10*3/uL (ref 4.0–10.5)

## 2017-08-25 NOTE — Progress Notes (Signed)
GS Progress Note Subjective: Patient feels rumbling around in her abdomen and is very hungry, but she has not passed any gas or had bowel movement.  When she does I will start to clamp her NGT and start liquids  Objective: Vital signs in last 24 hours: Temp:  [98.2 F (36.8 C)-98.7 F (37.1 C)] 98.2 F (36.8 C) (02/01 0420) Pulse Rate:  [77-88] 84 (02/01 0420) Resp:  [15-21] 17 (02/01 0420) BP: (145-172)/(55-80) 149/68 (02/01 0420) SpO2:  [95 %-99 %] 97 % (02/01 0420) Last BM Date: 08/24/17  Intake/Output from previous day: 01/31 0701 - 02/01 0700 In: 2213.5 [I.V.:2213.5] Out: 1520 [Urine:1250; Emesis/NG output:270] Intake/Output this shift: No intake/output data recorded.  Lungs: Clear  Abd: some bowel sounds,  Soft, distended.  Completely nontender.  No X-rays today.  Extremities: No changes  Neuro: Intact  Lab Results: CBC  Recent Labs    08/24/17 0610 08/25/17 0542  WBC 5.0 5.2  HGB 10.1* 9.9*  HCT 31.6* 31.1*  PLT 212 202   BMET Recent Labs    08/24/17 0610 08/25/17 0542  NA 139 137  K 4.7 4.3  CL 104 105  CO2 28 23  GLUCOSE 103* 115*  BUN 28* 18  CREATININE 1.78* 1.48*  CALCIUM 8.7* 9.0   PT/INR Recent Labs    08/23/17 0342  LABPROT 14.2  INR 1.11   ABG Recent Labs    08/23/17 0524  PHART 7.431  HCO3 32.8*    Studies/Results: Dg Abd Portable 1v-small Bowel Obstruction Protocol-initial, 8 Hr Delay  Result Date: 08/23/2017 CLINICAL DATA:  Small bowel obstruction, lower abdominal pain. EXAM: PORTABLE ABDOMEN - 1 VIEW COMPARISON:  08/22/2017 and CT abdomen pelvis 08/22/2017. FINDINGS: Persistent small bowel dilatation. Dilute contrast is seen within the bowel. Difficult to exclude extraluminal oral contrast. Image quality is degraded by body habitus. IMPRESSION: 1. Gaseous distention of small bowel persists, consistent with the given history of small bowel obstruction. 2. Dilute contrast within the bowel. Difficult to definitively exclude  extraluminal contrast/leak. Image quality is degraded by body habitus. Electronically Signed   By: Lorin Picket M.D.   On: 08/23/2017 11:53   Dg Colon W/water Sol Cm  Result Date: 08/24/2017 CLINICAL DATA:  Colostomy reversal 07/13/2017. Recent CT demonstrating apparent obstruction at the colon/colon anastomosis. EXAM: WATER SOLUBLE CONTRAST ENEMA TECHNIQUE: Routine single contrast water soluble enema performed. FLUOROSCOPY TIME:  Fluoroscopy Time:  2 minutes and 36 seconds Radiation Exposure Index (if provided by the fluoroscopic device): Not applicable. Number of Acquired Spot Images: 3 COMPARISON:  CT of 08/22/2017 FINDINGS: Preprocedure scout film demonstrates probable contrast in pelvic bowel loops. Dilated abdominal bowel loops. Administration of contrast demonstrates normal caliber of the rectum and sigmoid. Despite multiple positional maneuvers, contrast does not pass the colon to colon anastomosis site in the right lower quadrant. Post evacuation image demonstrates a moderate amount of retained contrast within the sigmoid. IMPRESSION: Colonic obstruction within the right lower quadrant, at the site of colonic to colonic anastomosis on prior CT. Electronically Signed   By: Abigail Miyamoto M.D.   On: 08/24/2017 09:44    Anti-infectives: Anti-infectives (From admission, onward)   Start     Dose/Rate Route Frequency Ordered Stop   08/24/17 1300  vancomycin (VANCOCIN) IVPB 1000 mg/200 mL premix  Status:  Discontinued     1,000 mg 200 mL/hr over 60 Minutes Intravenous Every 48 hours 08/22/17 1250 08/22/17 2103   08/24/17 0230  piperacillin-tazobactam (ZOSYN) IVPB 3.375 g  Status:  Discontinued  3.375 g 12.5 mL/hr over 240 Minutes Intravenous Every 8 hours 08/23/17 2025 08/24/17 0819   08/22/17 1830  piperacillin-tazobactam (ZOSYN) IVPB 2.25 g  Status:  Discontinued     2.25 g 100 mL/hr over 30 Minutes Intravenous Every 8 hours 08/22/17 1250 08/23/17 2025   08/22/17 1245   piperacillin-tazobactam (ZOSYN) IVPB 3.375 g     3.375 g 100 mL/hr over 30 Minutes Intravenous  Once 08/22/17 1231 08/22/17 1305   08/22/17 1245  vancomycin (VANCOCIN) 2,000 mg in sodium chloride 0.9 % 500 mL IVPB  Status:  Discontinued     2,000 mg 250 mL/hr over 120 Minutes Intravenous  Once 08/22/17 1233 08/22/17 2103      Assessment/Plan: s/p Procedure(s): FLEXIBLE SIGMOIDOSCOPY Dr. hung was able to get past the colonic anastomosis, but it was strictured.  However, I am concerned that the actual obstructed part may be just above the anastomosis.    She is in no way sick enough to require reoperation at this point, butnutritinally she is getting depleted and cannot get PICC line because of her renal failure  If we do need to reoperate, may need colostomy again.  LOS: 3 days    Kathryne Eriksson. [name], MD, FACS [phone] [facility] Surgery 08/25/2017

## 2017-08-25 NOTE — Care Management Important Message (Signed)
Important Message  Patient Details  Name: Crystal [name] MRN: [id number] Date of Birth: 04-Oct-1950   Medicare Important Message Given:  Yes    Carles Collet, RN 08/25/2017, 10:49 AM

## 2017-08-25 NOTE — Progress Notes (Signed)
Subjective: No complaints.  Objective: Vital signs in last 24 hours: Temp:  [98.2 F (36.8 C)-98.7 F (37.1 C)] 98.2 F (36.8 C) (02/01 0420) Pulse Rate:  [77-88] 84 (02/01 0420) Resp:  [15-21] 17 (02/01 0420) BP: (145-172)/(55-80) 149/68 (02/01 0420) SpO2:  [95 %-99 %] 97 % (02/01 0420) Last BM Date: 08/24/17  Intake/Output from previous day: 01/31 0701 - 02/01 0700 In: 2213.5 [I.V.:2213.5] Out: 1520 [Urine:1250; Emesis/NG output:270] Intake/Output this shift: Total I/O In: 995 [I.V.:995] Out: 100 [Emesis/NG output:100]  General appearance: alert and no distress GI: soft, distended, tympanic  Lab Results: Recent Labs    08/22/17 1053 08/22/17 1140 08/23/17 0342 08/24/17 0610  WBC 9.7  --  4.7 5.0  HGB 12.0 12.2 10.2* 10.1*  HCT 37.4 36.0 32.8* 31.6*  PLT 295  --  208 212   BMET Recent Labs    08/22/17 1556 08/23/17 0342 08/24/17 0610  NA 136 140  139 139  K 5.8* 4.8  4.7 4.7  CL 104 103  102 104  CO2 23 28  27 28   GLUCOSE 76 60*  59* 103*  BUN 66* 49*  51* 28*  CREATININE 3.36* 2.46*  2.41* 1.78*  CALCIUM 8.4* 8.1*  8.0* 8.7*   LFT Recent Labs    08/23/17 0342  PROT 5.7*  ALBUMIN 2.7*  2.7*  AST 15  ALT 13*  ALKPHOS 69  BILITOT 0.6   PT/INR Recent Labs    08/23/17 0342  LABPROT 14.2  INR 1.11   Hepatitis Panel No results for input(s): HEPBSAG, HCVAB, HEPAIGM, HEPBIGM in the last 72 hours. C-Diff No results for input(s): CDIFFTOX in the last 72 hours. Fecal Lactopherrin No results for input(s): FECLLACTOFRN in the last 72 hours.  Studies/Results: Dg Abd Portable 1v-small Bowel Obstruction Protocol-initial, 8 Hr Delay  Result Date: 08/23/2017 CLINICAL DATA:  Small bowel obstruction, lower abdominal pain. EXAM: PORTABLE ABDOMEN - 1 VIEW COMPARISON:  08/22/2017 and CT abdomen pelvis 08/22/2017. FINDINGS: Persistent small bowel dilatation. Dilute contrast is seen within the bowel. Difficult to exclude extraluminal oral contrast.  Image quality is degraded by body habitus. IMPRESSION: 1. Gaseous distention of small bowel persists, consistent with the given history of small bowel obstruction. 2. Dilute contrast within the bowel. Difficult to definitively exclude extraluminal contrast/leak. Image quality is degraded by body habitus. Electronically Signed   By: Lorin Picket M.D.   On: 08/23/2017 11:53   Dg Colon W/water Sol Cm  Result Date: 08/24/2017 CLINICAL DATA:  Colostomy reversal 07/13/2017. Recent CT demonstrating apparent obstruction at the colon/colon anastomosis. EXAM: WATER SOLUBLE CONTRAST ENEMA TECHNIQUE: Routine single contrast water soluble enema performed. FLUOROSCOPY TIME:  Fluoroscopy Time:  2 minutes and 36 seconds Radiation Exposure Index (if provided by the fluoroscopic device): Not applicable. Number of Acquired Spot Images: 3 COMPARISON:  CT of 08/22/2017 FINDINGS: Preprocedure scout film demonstrates probable contrast in pelvic bowel loops. Dilated abdominal bowel loops. Administration of contrast demonstrates normal caliber of the rectum and sigmoid. Despite multiple positional maneuvers, contrast does not pass the colon to colon anastomosis site in the right lower quadrant. Post evacuation image demonstrates a moderate amount of retained contrast within the sigmoid. IMPRESSION: Colonic obstruction within the right lower quadrant, at the site of colonic to colonic anastomosis on prior CT. Electronically Signed   By: Abigail Miyamoto M.D.   On: 08/24/2017 09:44    Medications:  Scheduled: . insulin aspart  0-20 Units Subcutaneous Q4H  . levothyroxine  44 mcg Intravenous Daily  .  pantoprazole (PROTONIX) IV  40 mg Intravenous QHS   Continuous: . dextrose 5 % and 0.45% NaCl 100 mL/hr at 08/25/17 0219    Assessment/Plan: 1) Anastamotic stricture - No complications from the dilation, but she has not passed any flatus or stool.  Clinically there is no change.  I do not feel that further dilation will be of a  benefit.  Further management per Dr. Hulen Skains.  LOS: 3 days   Chamia Schmutz D 08/25/2017, 6:54 AM

## 2017-08-26 ENCOUNTER — Inpatient Hospital Stay (HOSPITAL_COMMUNITY): Payer: Medicare Other

## 2017-08-26 LAB — BASIC METABOLIC PANEL
ANION GAP: 9 (ref 5–15)
BUN: 13 mg/dL (ref 6–20)
CO2: 24 mmol/L (ref 22–32)
Calcium: 8.9 mg/dL (ref 8.9–10.3)
Chloride: 104 mmol/L (ref 101–111)
Creatinine, Ser: 1.37 mg/dL — ABNORMAL HIGH (ref 0.44–1.00)
GFR calc Af Amer: 45 mL/min — ABNORMAL LOW (ref >60)
GFR calc non Af Amer: 39 mL/min — ABNORMAL LOW (ref >60)
GLUCOSE: 106 mg/dL — AB (ref 65–99)
POTASSIUM: 4.1 mmol/L (ref 3.5–5.1)
Sodium: 137 mmol/L (ref 135–145)

## 2017-08-26 LAB — GLUCOSE, CAPILLARY
GLUCOSE-CAPILLARY: 120 mg/dL — AB (ref 65–99)
GLUCOSE-CAPILLARY: 138 mg/dL — AB (ref 65–99)
GLUCOSE-CAPILLARY: 122 mg/dL — AB (ref 65–99)
Glucose-Capillary: 101 mg/dL — ABNORMAL HIGH (ref 65–99)
Glucose-Capillary: 111 mg/dL — ABNORMAL HIGH (ref 65–99)
Glucose-Capillary: 111 mg/dL — ABNORMAL HIGH (ref 65–99)

## 2017-08-26 MED ORDER — BISACODYL 10 MG RE SUPP
10.0000 mg | Freq: Once | RECTAL | Status: AC
  Administered 2017-08-26: 10 mg via RECTAL
  Filled 2017-08-26: qty 1

## 2017-08-26 NOTE — Progress Notes (Signed)
2 Days Post-Op  Subjective: Pleasant.  Stable and alert.  In no distress Says she is having no pain, but occasionally has some cramps. No flatus NG output 430 mL.  Dr. Benson Norway was able to dilate the enterocolic anastomotic stricture to 13 mm.  He states that no further dilatation will be indicated. X-rays today show some colonic gas but no small bowel gas  Lab work shows potassium 4.1.  BUN 13.  Creatinine 1.37.  Glucose 106.  Objective: Vital signs in last 24 hours: Temp:  [98.5 F (36.9 C)-99.2 F (37.3 C)] 98.5 F (36.9 C) (02/02 0444) Pulse Rate:  [78-88] 78 (02/02 0444) Resp:  [18] 18 (02/02 0444) BP: (139-160)/(64-71) 139/64 (02/02 0444) SpO2:  [98 %] 98 % (02/02 0444) Last BM Date: 08/24/17  Intake/Output from previous day: 02/01 0701 - 02/02 0700 In: 2550 [I.V.:2550] Out: 430 [Emesis/NG output:430] Intake/Output this shift: No intake/output data recorded.  General appearance: alert and pleasant.  NG in place. Resp: clear to auscultation bilaterally GI: soft but distended.  No tenderness or guarding.  Occasional bowel sounds.  Midline incision well healed without hernia  Lab Results:  Recent Labs    08/24/17 0610 08/25/17 0542  WBC 5.0 5.2  HGB 10.1* 9.9*  HCT 31.6* 31.1*  PLT 212 202   BMET Recent Labs    08/25/17 0542 08/26/17 0607  NA 137 137  K 4.3 4.1  CL 105 104  CO2 23 24  GLUCOSE 115* 106*  BUN 18 13  CREATININE 1.48* 1.37*  CALCIUM 9.0 8.9   PT/INR No results for input(s): LABPROT, INR in the last 72 hours. ABG No results for input(s): PHART, HCO3 in the last 72 hours.  Invalid input(s): PCO2, PO2  Studies/Results: Dg Colon W/water Sol Cm  Result Date: 08/24/2017 CLINICAL DATA:  Colostomy reversal 07/13/2017. Recent CT demonstrating apparent obstruction at the colon/colon anastomosis. EXAM: WATER SOLUBLE CONTRAST ENEMA TECHNIQUE: Routine single contrast water soluble enema performed. FLUOROSCOPY TIME:  Fluoroscopy Time:  2 minutes and  36 seconds Radiation Exposure Index (if provided by the fluoroscopic device): Not applicable. Number of Acquired Spot Images: 3 COMPARISON:  CT of 08/22/2017 FINDINGS: Preprocedure scout film demonstrates probable contrast in pelvic bowel loops. Dilated abdominal bowel loops. Administration of contrast demonstrates normal caliber of the rectum and sigmoid. Despite multiple positional maneuvers, contrast does not pass the colon to colon anastomosis site in the right lower quadrant. Post evacuation image demonstrates a moderate amount of retained contrast within the sigmoid. IMPRESSION: Colonic obstruction within the right lower quadrant, at the site of colonic to colonic anastomosis on prior CT. Electronically Signed   By: Abigail Miyamoto M.D.   On: 08/24/2017 09:44    Anti-infectives: Anti-infectives (From admission, onward)   Start     Dose/Rate Route Frequency Ordered Stop   08/24/17 1300  vancomycin (VANCOCIN) IVPB 1000 mg/200 mL premix  Status:  Discontinued     1,000 mg 200 mL/hr over 60 Minutes Intravenous Every 48 hours 08/22/17 1250 08/22/17 2103   08/24/17 0230  piperacillin-tazobactam (ZOSYN) IVPB 3.375 g  Status:  Discontinued     3.375 g 12.5 mL/hr over 240 Minutes Intravenous Every 8 hours 08/23/17 2025 08/24/17 0819   08/22/17 1830  piperacillin-tazobactam (ZOSYN) IVPB 2.25 g  Status:  Discontinued     2.25 g 100 mL/hr over 30 Minutes Intravenous Every 8 hours 08/22/17 1250 08/23/17 2025   08/22/17 1245  piperacillin-tazobactam (ZOSYN) IVPB 3.375 g     3.375 g 100 mL/hr  over 30 Minutes Intravenous  Once 08/22/17 1231 08/22/17 1305   08/22/17 1245  vancomycin (VANCOCIN) 2,000 mg in sodium chloride 0.9 % 500 mL IVPB  Status:  Discontinued     2,000 mg 250 mL/hr over 120 Minutes Intravenous  Once 08/22/17 1233 08/22/17 2103      Assessment/Plan: s/p Procedure(s): FLEXIBLE SIGMOIDOSCOPY  Partial colonic obstruction secondary to anastomotic stricture She is not ill or tender and so  no indication for surgical intervention today If she does not open up, however, she may need exploration and possible colostomy.  Continue IV support and NG suction Reassess daily Ambulate Maintain electrolyte balance     LOS: 4 days    Adin Hector 08/26/2017

## 2017-08-27 LAB — CULTURE, BLOOD (ROUTINE X 2)
CULTURE: NO GROWTH
Culture: NO GROWTH
Special Requests: ADEQUATE
Special Requests: ADEQUATE

## 2017-08-27 LAB — BASIC METABOLIC PANEL
Anion gap: 8 (ref 5–15)
BUN: 10 mg/dL (ref 6–20)
CALCIUM: 8.9 mg/dL (ref 8.9–10.3)
CHLORIDE: 106 mmol/L (ref 101–111)
CO2: 24 mmol/L (ref 22–32)
CREATININE: 1.44 mg/dL — AB (ref 0.44–1.00)
GFR calc Af Amer: 42 mL/min — ABNORMAL LOW (ref >60)
GFR calc non Af Amer: 37 mL/min — ABNORMAL LOW (ref >60)
Glucose, Bld: 107 mg/dL — ABNORMAL HIGH (ref 65–99)
Potassium: 4 mmol/L (ref 3.5–5.1)
SODIUM: 138 mmol/L (ref 135–145)

## 2017-08-27 LAB — GLUCOSE, CAPILLARY
GLUCOSE-CAPILLARY: 116 mg/dL — AB (ref 65–99)
GLUCOSE-CAPILLARY: 133 mg/dL — AB (ref 65–99)
GLUCOSE-CAPILLARY: 136 mg/dL — AB (ref 65–99)
GLUCOSE-CAPILLARY: 97 mg/dL (ref 65–99)
Glucose-Capillary: 101 mg/dL — ABNORMAL HIGH (ref 65–99)
Glucose-Capillary: 115 mg/dL — ABNORMAL HIGH (ref 65–99)
Glucose-Capillary: 124 mg/dL — ABNORMAL HIGH (ref 65–99)

## 2017-08-27 NOTE — Progress Notes (Signed)
3 Days Post-Op  Subjective: Pleasant and very cooperative. No better and no worse No stool or flatus NG output low.  NG seems to be in good position Having almost no pain, however. Occasional cramps  Potassium 4.0.  Creatinine 1.44.  Glucose 107.  Objective: Vital signs in last 24 hours: Temp:  [97.8 F (36.6 C)-98.9 F (37.2 C)] 98.7 F (37.1 C) (02/03 0400) Pulse Rate:  [75-89] 75 (02/03 0400) Resp:  [17] 17 (02/03 0400) BP: (142-156)/(64-66) 156/66 (02/03 0400) SpO2:  [94 %-98 %] 94 % (02/03 0400) Last BM Date: 08/24/17  Intake/Output from previous day: 02/02 0701 - 02/03 0700 In: 2266.7 [I.V.:2266.7] Out: 100 [Emesis/NG output:100] Intake/Output this shift: No intake/output data recorded.   PE  General appearance: alert and pleasant.  NG in place. Resp: clear to auscultation bilaterally GI: soft but distended.  No tenderness or guarding.  Occasional bowel sounds.  Midline incision well healed without hernia     Lab Results:  Recent Labs    08/25/17 0542  WBC 5.2  HGB 9.9*  HCT 31.1*  PLT 202   BMET Recent Labs    08/26/17 0607 08/27/17 0517  NA 137 138  K 4.1 4.0  CL 104 106  CO2 24 24  GLUCOSE 106* 107*  BUN 13 10  CREATININE 1.37* 1.44*  CALCIUM 8.9 8.9   PT/INR No results for input(s): LABPROT, INR in the last 72 hours. ABG No results for input(s): PHART, HCO3 in the last 72 hours.  Invalid input(s): PCO2, PO2  Studies/Results: Dg Abd Acute W/chest  Result Date: 08/26/2017 CLINICAL DATA:  Small bowel obstruction.  Nausea, vomiting EXAM: DG ABDOMEN ACUTE W/ 1V CHEST COMPARISON:  08/24/2017 FINDINGS: There is a nasogastric tube with the tip just beyond the gastroesophageal junction. There is persistent bowel dilatation in the right side of the abdomen and lower abdomen. There is relative decompression of the stomach and proximal small bowel. No radiopaque calculi or other significant radiographic abnormality is seen. Heart size and  mediastinal contours are within normal limits. Dual lead cardiac pacemaker. Small left pleural effusion. IMPRESSION: Interval improvement in bowel obstruction at the colo-colonic anastomosis. Electronically Signed   By: Kathreen Devoid   On: 08/26/2017 09:28    Anti-infectives: Anti-infectives (From admission, onward)   Start     Dose/Rate Route Frequency Ordered Stop   08/24/17 1300  vancomycin (VANCOCIN) IVPB 1000 mg/200 mL premix  Status:  Discontinued     1,000 mg 200 mL/hr over 60 Minutes Intravenous Every 48 hours 08/22/17 1250 08/22/17 2103   08/24/17 0230  piperacillin-tazobactam (ZOSYN) IVPB 3.375 g  Status:  Discontinued     3.375 g 12.5 mL/hr over 240 Minutes Intravenous Every 8 hours 08/23/17 2025 08/24/17 0819   08/22/17 1830  piperacillin-tazobactam (ZOSYN) IVPB 2.25 g  Status:  Discontinued     2.25 g 100 mL/hr over 30 Minutes Intravenous Every 8 hours 08/22/17 1250 08/23/17 2025   08/22/17 1245  piperacillin-tazobactam (ZOSYN) IVPB 3.375 g     3.375 g 100 mL/hr over 30 Minutes Intravenous  Once 08/22/17 1231 08/22/17 1305   08/22/17 1245  vancomycin (VANCOCIN) 2,000 mg in sodium chloride 0.9 % 500 mL IVPB  Status:  Discontinued     2,000 mg 250 mL/hr over 120 Minutes Intravenous  Once 08/22/17 1233 08/22/17 2103      Assessment/Plan: s/p Procedure(s): FLEXIBLE SIGMOIDOSCOPY  Partial, high-grade colonic obstruction secondary to anastomotic stricture She is not ill or tender and so no indication for  surgical intervention today If she does not open up, however, she may need exploration and possible colostomy.I told her that I would discuss this with Dr. Hulen Skains who will assume her care tomorrow. She is understanding and accepting of this.  Continue IV support and NG suction Check abdominal x-ray tomorrow morning Ambulate Maintain electrolyte balance     LOS: 5 days    Adin Hector 08/27/2017

## 2017-08-28 ENCOUNTER — Inpatient Hospital Stay (HOSPITAL_COMMUNITY): Payer: Medicare Other

## 2017-08-28 LAB — COMPREHENSIVE METABOLIC PANEL
ALT: 9 U/L — AB (ref 14–54)
ANION GAP: 11 (ref 5–15)
AST: 12 U/L — ABNORMAL LOW (ref 15–41)
Albumin: 3.1 g/dL — ABNORMAL LOW (ref 3.5–5.0)
Alkaline Phosphatase: 70 U/L (ref 38–126)
BUN: 8 mg/dL (ref 6–20)
CHLORIDE: 105 mmol/L (ref 101–111)
CO2: 22 mmol/L (ref 22–32)
CREATININE: 1.35 mg/dL — AB (ref 0.44–1.00)
Calcium: 9 mg/dL (ref 8.9–10.3)
GFR calc non Af Amer: 40 mL/min — ABNORMAL LOW (ref >60)
GFR, EST AFRICAN AMERICAN: 46 mL/min — AB (ref >60)
Glucose, Bld: 124 mg/dL — ABNORMAL HIGH (ref 65–99)
Potassium: 3.8 mmol/L (ref 3.5–5.1)
SODIUM: 138 mmol/L (ref 135–145)
Total Bilirubin: 0.7 mg/dL (ref 0.3–1.2)
Total Protein: 6.5 g/dL (ref 6.5–8.1)

## 2017-08-28 LAB — GLUCOSE, CAPILLARY
GLUCOSE-CAPILLARY: 112 mg/dL — AB (ref 65–99)
GLUCOSE-CAPILLARY: 142 mg/dL — AB (ref 65–99)
GLUCOSE-CAPILLARY: 134 mg/dL — AB (ref 65–99)
GLUCOSE-CAPILLARY: 103 mg/dL — AB (ref 65–99)
Glucose-Capillary: 131 mg/dL — ABNORMAL HIGH (ref 65–99)

## 2017-08-28 LAB — CBC
HCT: 33.4 % — ABNORMAL LOW (ref 36.0–46.0)
Hemoglobin: 10.6 g/dL — ABNORMAL LOW (ref 12.0–15.0)
MCH: 26.4 pg (ref 26.0–34.0)
MCHC: 31.7 g/dL (ref 30.0–36.0)
MCV: 83.1 fL (ref 78.0–100.0)
PLATELETS: 192 10*3/uL (ref 150–400)
RBC: 4.02 MIL/uL (ref 3.87–5.11)
RDW: 15.2 % (ref 11.5–15.5)
WBC: 5.3 10*3/uL (ref 4.0–10.5)

## 2017-08-28 LAB — PREALBUMIN: Prealbumin: 12.8 mg/dL — ABNORMAL LOW (ref 18–38)

## 2017-08-28 MED ORDER — DEXTROSE-NACL 5-0.45 % IV SOLN
INTRAVENOUS | Status: AC
  Administered 2017-08-28 – 2017-08-29 (×2): via INTRAVENOUS

## 2017-08-28 MED ORDER — SODIUM CHLORIDE 0.9% FLUSH: 10.0000 mL | INTRAVENOUS | Status: DC | PRN

## 2017-08-28 MED ORDER — TRAVASOL 10 % IV SOLN
INTRAVENOUS | Status: AC
  Administered 2017-08-28: 18:00:00 via INTRAVENOUS
  Filled 2017-08-28: qty 336

## 2017-08-28 NOTE — Progress Notes (Signed)
PHARMACY - ADULT TOTAL PARENTERAL NUTRITION CONSULT NOTE   Pharmacy Consult for TPN Indication: Bowel obstruction  Patient Measurements: Height: 5\' 5"  (165.1 cm) Weight: 216 lb 4.3 oz (98.1 kg) IBW/kg (Calculated) : 57 TPN AdjBW (KG): 67.3 Body mass index is 35.99 kg/m.  Assessment: 49 YOF who was recently discharged from the hospital for severe dehydration from gastroenteritis presented to [facility] ED with abdominal pain, bloating, nausea and vomiting. 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 CT abd on this admission was consistent with an obstruction at the anastomotic site of the recent colon-colon anastomosis with colonic dilatation and small-bowel dilatation. Marland Kitchen   GI: She currently has an NG tube in place with minimal output and a small amount of flatus yesterday morning. Planning possible surgery later this week. Albumin on admission was low at 2.7. Pre-albumin 12.8. NG tube output 100 mL Endo: Type II DM, A1c 8.4 in December, CBGs 97-142 on resistant SSI  Insulin requirements in the past 24 hours: 12 units SSI  Lytes: Lytes wnl  Renal: No UOP charted, SCr stable at 1.35 Pulm: on RA  Cards: BP 142/57, HR wnl  Hepatobil: LFTs low to normal  ID: No issues, no abx   TPN Access: CVC 2/4>> TPN start date: 2/4>>  Nutritional Goals (per RD recommendations on 2/4): Kcal:  1750-1950 Protein:  100-115 grams Fluid:  > 1.7 L  Goal TPN rate is 100 ml/hr   Current Nutrition:   Plan:  Start TPN at 40 mL/hr. This TPN provides 34 g of protein, 96 g of dextrose, and 19 g of lipids which provides 633 kCals per day, meeting 36% of patient needs Electrolytes in TPN: Standard concentration except Na 60 mEq/L. Cl:Ac 1:1 Add MVI, trace elements to TPN Continue resistant insulin sliding scale Decrease D5 1/2NS to 60 ml/hr Monitor TPN labs  Albertina Parr, PharmD., BCPS Clinical Pharmacist Phone [phone]

## 2017-08-28 NOTE — Procedures (Signed)
Central Venous Catheter Insertion Procedure Note MEG NIEMEIER [id number] 06-Oct-1950  Procedure: Insertion of Central Venous Catheter Indications: Drug and/or fluid administration, Frequent blood sampling and TPN  Procedure Details Consent: Risks of procedure as well as the alternatives and risks of each were explained to the (patient/caregiver).  Consent for procedure obtained. Time Out: Verified patient identification, verified procedure, site/side was marked, verified correct patient position, special equipment/implants available, medications/allergies/relevent history reviewed, required imaging and test results available.  Performed  Maximum sterile technique was used including antiseptics, cap, gloves, gown, hand hygiene, mask and sheet. Skin prep: Chlorhexidine; local anesthetic administered A antimicrobial bonded/coated triple lumen catheter was placed in the right subclavian vein using the Seldinger technique.  Evaluation Blood flow good Complications: No apparent complications Patient did tolerate procedure well. Chest X-ray ordered to verify placement.  CXR: pending.  Judeth Horn 08/28/2017, 10:14 AM

## 2017-08-28 NOTE — Progress Notes (Signed)
Initial Nutrition Assessment  DOCUMENTATION CODES:   Obesity unspecified  INTERVENTION:   -TPN management per pharmacy  NUTRITION DIAGNOSIS:   Inadequate oral intake related to altered GI function as evidenced by NPO status.  GOAL:   Patient will meet greater than or equal to 90% of their needs  MONITOR:   Diet advancement, Labs, Weight trends, Skin, I & O's  REASON FOR ASSESSMENT:   Consult New TPN/TNA  ASSESSMENT:   Pt is a a 67 y.o. female with past medical history significant for asthma, heart failure, pacemaker, CKD, depression, diabetes who was recently discharged from the hospital for hypovolemia and hypotension 2/2 severe dehydration from gastroenteritis who presented to [facility] ED today with complaints of abdominal pain, bloating, nausea and vomiting. Pt had a colostomy reversal on December 20th by Dr. Hulen Skains.   1/29- NGT placed 1/31- s/p FFS with dilation for anastomotic stricture per GI 2/4- central venous catheter placed  Pt with partial colonic obstruction; per surgery notes, pt may require exploration and colostomy.   NGT output- 100 ml x 24 hours  Spoke with pt at bedside, who reports decreased appetite since December after undergoing colostomy reversal. Per pt, she often experiences GI distress with eating (diarrhea or vomiting). Her oral intake has been inconsistent due to these symptoms ("I can eat well one day, but I'll usually pay for it the next day"). She shares the last time she ate was 08/20/17, however, she vomited what she ate.   She denies any weight loss; reports UBW ranges between 199-210#. Pt reports she has been walking around the unit without difficulty.   Discussed plan to initiate TPN and how she will receive nutrition while NPO with NGT.   Labs reviewed: CBGS: 101-142 (inpatient orders for glycemic control are 0-20 units insulin aspart every 4 hours).   NUTRITION - FOCUSED PHYSICAL EXAM:    Most Recent Value  Orbital Region  No depletion   Upper Arm Region  No depletion  Thoracic and Lumbar Region  No depletion  Buccal Region  No depletion  Temple Region  No depletion  Clavicle Bone Region  No depletion  Clavicle and Acromion Bone Region  No depletion  Scapular Bone Region  No depletion  Dorsal Hand  No depletion  Patellar Region  No depletion  Anterior Thigh Region  No depletion  Posterior Calf Region  No depletion  Edema (RD Assessment)  Mild  Hair  Reviewed  Eyes  Reviewed  Mouth  Reviewed  Skin  Reviewed  Nails  Reviewed       Diet Order:  Diet NPO time specified TPN ADULT (ION)  EDUCATION NEEDS:   Education needs have been addressed  Skin:  Skin Assessment: Reviewed RN Assessment  Last BM:  08/24/17  Height:   Ht Readings from Last 1 Encounters:  08/23/17 5\' 5"  (1.651 m)    Weight:   Wt Readings from Last 1 Encounters:  08/23/17 216 lb 4.3 oz (98.1 kg)    Ideal Body Weight:  56.8 kg  BMI:  Body mass index is 35.99 kg/m.  Estimated Nutritional Needs:   Kcal:  6803-2122  Protein:  100-115 grams  Fluid:  > 1.7 L    Syra Sirmons A. Jimmye Norman, RD, LDN, CDE Pager: [phone] After hours Pager: [phone]

## 2017-08-28 NOTE — Progress Notes (Signed)
GS Progress Note Subjective: Patient is the same.  Had a small amount of flatus yesterday morning, but nothing since that time.  No distress.  Minimal amount out of NGT  Objective: Vital signs in last 24 hours: Temp:  [98.4 F (36.9 C)-99.5 F (37.5 C)] 99.1 F (37.3 C) (02/04 0416) Pulse Rate:  [70-86] 78 (02/04 0416) Resp:  [16-18] 18 (02/04 0416) BP: (142-149)/(57-74) 142/57 (02/04 0416) SpO2:  [97 %-98 %] 97 % (02/04 0416) Last BM Date: 08/24/17  Intake/Output from previous day: 02/03 0701 - 02/04 0700 In: 2521.7 [I.V.:2521.7] Out: 100 [Emesis/NG output:100] Intake/Output this shift: No intake/output data recorded.  Lungs: Clear  Abd: Hypoactive bowel sounds.    Extremities: No changes  Neuro: Intact  Lab Results: CBC  Recent Labs    08/28/17 0616  WBC 5.3  HGB 10.6*  HCT 33.4*  PLT 192   BMET Recent Labs    08/27/17 0517 08/28/17 0616  NA 138 138  K 4.0 3.8  CL 106 105  CO2 24 22  GLUCOSE 107* 124*  BUN 10 8  CREATININE 1.44* 1.35*  CALCIUM 8.9 9.0   PT/INR No results for input(s): LABPROT, INR in the last 72 hours. ABG No results for input(s): PHART, HCO3 in the last 72 hours.  Invalid input(s): PCO2, PO2  Studies/Results: Dg Abd Portable 1v  Result Date: 08/28/2017 CLINICAL DATA:  67 year old female with small bowel obstruction. Patient states no complaints this morning. Subsequent encounter. EXAM: PORTABLE ABDOMEN - 1 VIEW COMPARISON:  08/26/2017 plain film exams.  08/22/2017 CT. FINDINGS: Thickened wall and dilation at the level of bowel anastomosis right colon. This was noted on prior examination when upright view was obtained to rule out free air. Gas-filled slightly prominent size small bowel loops relatively similar to prior exam. Findings suggestive of partial bowel obstruction which is being treated with nasogastric tube decompression. The possibility of free intraperitoneal air cannot be assessed on a supine view. IMPRESSION: Persistent  abnormal bowel gas pattern suggestive of partial bowel obstruction which is being treated with nasogastric tube decompression. Electronically Signed   By: Genia Del M.D.   On: 08/28/2017 07:34    Anti-infectives: Anti-infectives (From admission, onward)   Start     Dose/Rate Route Frequency Ordered Stop   08/24/17 1300  vancomycin (VANCOCIN) IVPB 1000 mg/200 mL premix  Status:  Discontinued     1,000 mg 200 mL/hr over 60 Minutes Intravenous Every 48 hours 08/22/17 1250 08/22/17 2103   08/24/17 0230  piperacillin-tazobactam (ZOSYN) IVPB 3.375 g  Status:  Discontinued     3.375 g 12.5 mL/hr over 240 Minutes Intravenous Every 8 hours 08/23/17 2025 08/24/17 0819   08/22/17 1830  piperacillin-tazobactam (ZOSYN) IVPB 2.25 g  Status:  Discontinued     2.25 g 100 mL/hr over 30 Minutes Intravenous Every 8 hours 08/22/17 1250 08/23/17 2025   08/22/17 1245  piperacillin-tazobactam (ZOSYN) IVPB 3.375 g     3.375 g 100 mL/hr over 30 Minutes Intravenous  Once 08/22/17 1231 08/22/17 1305   08/22/17 1245  vancomycin (VANCOCIN) 2,000 mg in sodium chloride 0.9 % 500 mL IVPB  Status:  Discontinued     2,000 mg 250 mL/hr over 120 Minutes Intravenous  Once 08/22/17 1233 08/22/17 2103      Assessment/Plan: s/p Procedure(s): FLEXIBLE SIGMOIDOSCOPY Central line placement for TPN to start.  Possible surgery later this week.  LOS: 6 days    Kathryne Eriksson. [name], MD, FACS [phone] [facility] Surgery 08/28/2017

## 2017-08-29 LAB — COMPREHENSIVE METABOLIC PANEL
ALBUMIN: 3 g/dL — AB (ref 3.5–5.0)
ALK PHOS: 67 U/L (ref 38–126)
ALT: 8 U/L — AB (ref 14–54)
AST: 12 U/L — ABNORMAL LOW (ref 15–41)
Anion gap: 11 (ref 5–15)
BUN: 7 mg/dL (ref 6–20)
CO2: 24 mmol/L (ref 22–32)
CREATININE: 1.29 mg/dL — AB (ref 0.44–1.00)
Calcium: 9 mg/dL (ref 8.9–10.3)
Chloride: 103 mmol/L (ref 101–111)
GFR calc Af Amer: 49 mL/min — ABNORMAL LOW (ref >60)
GFR calc non Af Amer: 42 mL/min — ABNORMAL LOW (ref >60)
GLUCOSE: 136 mg/dL — AB (ref 65–99)
Potassium: 3.8 mmol/L (ref 3.5–5.1)
SODIUM: 138 mmol/L (ref 135–145)
Total Bilirubin: 0.8 mg/dL (ref 0.3–1.2)
Total Protein: 6.3 g/dL — ABNORMAL LOW (ref 6.5–8.1)

## 2017-08-29 LAB — CBC WITH DIFFERENTIAL/PLATELET
Basophils Absolute: 0 10*3/uL (ref 0.0–0.1)
Basophils Relative: 1 %
EOS PCT: 4 %
Eosinophils Absolute: 0.2 10*3/uL (ref 0.0–0.7)
HCT: 32.3 % — ABNORMAL LOW (ref 36.0–46.0)
HEMOGLOBIN: 10.1 g/dL — AB (ref 12.0–15.0)
LYMPHS PCT: 28 %
Lymphs Abs: 1.5 10*3/uL (ref 0.7–4.0)
MCH: 25.9 pg — AB (ref 26.0–34.0)
MCHC: 31.3 g/dL (ref 30.0–36.0)
MCV: 82.8 fL (ref 78.0–100.0)
MONOS PCT: 13 %
Monocytes Absolute: 0.7 10*3/uL (ref 0.1–1.0)
NEUTROS ABS: 2.9 10*3/uL (ref 1.7–7.7)
Neutrophils Relative %: 54 %
Platelets: 183 10*3/uL (ref 150–400)
RBC: 3.9 MIL/uL (ref 3.87–5.11)
RDW: 15.1 % (ref 11.5–15.5)
WBC: 5.3 10*3/uL (ref 4.0–10.5)

## 2017-08-29 LAB — MAGNESIUM: Magnesium: 1.5 mg/dL — ABNORMAL LOW (ref 1.7–2.4)

## 2017-08-29 LAB — GLUCOSE, CAPILLARY
GLUCOSE-CAPILLARY: 138 mg/dL — AB (ref 65–99)
GLUCOSE-CAPILLARY: 136 mg/dL — AB (ref 65–99)
GLUCOSE-CAPILLARY: 128 mg/dL — AB (ref 65–99)
GLUCOSE-CAPILLARY: 167 mg/dL — AB (ref 65–99)
Glucose-Capillary: 120 mg/dL — ABNORMAL HIGH (ref 65–99)
Glucose-Capillary: 152 mg/dL — ABNORMAL HIGH (ref 65–99)

## 2017-08-29 LAB — PREALBUMIN: Prealbumin: 12.1 mg/dL — ABNORMAL LOW (ref 18–38)

## 2017-08-29 LAB — TRIGLYCERIDES: Triglycerides: 218 mg/dL — ABNORMAL HIGH (ref <150)

## 2017-08-29 LAB — PHOSPHORUS: PHOSPHORUS: 3.7 mg/dL (ref 2.5–4.6)

## 2017-08-29 MED ORDER — DEXTROSE-NACL 5-0.45 % IV SOLN
INTRAVENOUS | Status: DC
  Administered 2017-08-29: 18:00:00 via INTRAVENOUS

## 2017-08-29 MED ORDER — TRAVASOL 10 % IV SOLN
INTRAVENOUS | Status: AC
  Administered 2017-08-29: 17:00:00 via INTRAVENOUS
  Filled 2017-08-29: qty 864

## 2017-08-29 MED ORDER — SODIUM CHLORIDE 0.9% FLUSH
10.0000 mL | Freq: Two times a day (BID) | INTRAVENOUS | Status: DC
  Administered 2017-08-29 – 2017-08-30 (×3): 10 mL
  Administered 2017-08-31: 20 mL

## 2017-08-29 MED ORDER — SODIUM CHLORIDE 0.9% FLUSH
10.0000 mL | INTRAVENOUS | Status: DC | PRN
  Administered 2017-08-31: 10 mL
  Filled 2017-08-29: qty 40

## 2017-08-29 MED ORDER — MAGNESIUM SULFATE 2 GM/50ML IV SOLN
2.0000 g | Freq: Once | INTRAVENOUS | Status: AC
  Administered 2017-08-29: 2 g via INTRAVENOUS
  Filled 2017-08-29: qty 50

## 2017-08-29 NOTE — Progress Notes (Signed)
GS Progress Note Subjective: Patient up in the bathroom this AM.  No distress.  Minimal output from NGT.  No apparent BM or flatus.  Objective: Vital signs in last 24 hours: Temp:  [98.6 F (37 C)-99.2 F (37.3 C)] 98.6 F (37 C) (02/05 0517) Pulse Rate:  [79-86] 86 (02/05 0517) Resp:  [18] 18 (02/05 0517) BP: (138-151)/(67-68) 138/68 (02/05 0517) SpO2:  [98 %-100 %] 100 % (02/05 0517) Last BM Date: 08/24/17  Intake/Output from previous day: 02/04 0701 - 02/05 0700 In: 2307.3 [I.V.:2307.3] Out: 150 [Emesis/NG output:150] Intake/Output this shift: No intake/output data recorded.  Lungs: Clear  Abd: Few bowel sounds.  Minimal output from NGT, no flatus or bowel movement.  Extremities: No clinical DVT  Neuro: Intact  Lab Results: CBC  Recent Labs    08/28/17 0616 08/29/17 0546  WBC 5.3 5.3  HGB 10.6* 10.1*  HCT 33.4* 32.3*  PLT 192 183   BMET Recent Labs    08/27/17 0517 08/28/17 0616  NA 138 138  K 4.0 3.8  CL 106 105  CO2 24 22  GLUCOSE 107* 124*  BUN 10 8  CREATININE 1.44* 1.35*  CALCIUM 8.9 9.0   PT/INR No results for input(s): LABPROT, INR in the last 72 hours. ABG No results for input(s): PHART, HCO3 in the last 72 hours.  Invalid input(s): PCO2, PO2  Studies/Results: Dg Chest Port 1 View  Result Date: 08/28/2017 CLINICAL DATA:  Central line placement EXAM: PORTABLE CHEST 1 VIEW COMPARISON:  08/26/2017 FINDINGS: Right subclavian central venous catheter tip in the lower SVC in good position. No pneumothorax AICD unchanged.  NG in the stomach. Cardiac enlargement without heart failure.  Lungs are clear IMPRESSION: Satisfactory central line placement.  Lungs are clear. Electronically Signed   By: Franchot Gallo M.D.   On: 08/28/2017 10:50   Dg Abd Portable 1v  Result Date: 08/28/2017 CLINICAL DATA:  67 year old female with small bowel obstruction. Patient states no complaints this morning. Subsequent encounter. EXAM: PORTABLE ABDOMEN - 1 VIEW  COMPARISON:  08/26/2017 plain film exams.  08/22/2017 CT. FINDINGS: Thickened wall and dilation at the level of bowel anastomosis right colon. This was noted on prior examination when upright view was obtained to rule out free air. Gas-filled slightly prominent size small bowel loops relatively similar to prior exam. Findings suggestive of partial bowel obstruction which is being treated with nasogastric tube decompression. The possibility of free intraperitoneal air cannot be assessed on a supine view. IMPRESSION: Persistent abnormal bowel gas pattern suggestive of partial bowel obstruction which is being treated with nasogastric tube decompression. Electronically Signed   By: Genia Del M.D.   On: 08/28/2017 07:34    Anti-infectives: Anti-infectives (From admission, onward)   Start     Dose/Rate Route Frequency Ordered Stop   08/24/17 1300  vancomycin (VANCOCIN) IVPB 1000 mg/200 mL premix  Status:  Discontinued     1,000 mg 200 mL/hr over 60 Minutes Intravenous Every 48 hours 08/22/17 1250 08/22/17 2103   08/24/17 0230  piperacillin-tazobactam (ZOSYN) IVPB 3.375 g  Status:  Discontinued     3.375 g 12.5 mL/hr over 240 Minutes Intravenous Every 8 hours 08/23/17 2025 08/24/17 0819   08/22/17 1830  piperacillin-tazobactam (ZOSYN) IVPB 2.25 g  Status:  Discontinued     2.25 g 100 mL/hr over 30 Minutes Intravenous Every 8 hours 08/22/17 1250 08/23/17 2025   08/22/17 1245  piperacillin-tazobactam (ZOSYN) IVPB 3.375 g     3.375 g 100 mL/hr over 30 Minutes  Intravenous  Once 08/22/17 1231 08/22/17 1305   08/22/17 1245  vancomycin (VANCOCIN) 2,000 mg in sodium chloride 0.9 % 500 mL IVPB  Status:  Discontinued     2,000 mg 250 mL/hr over 120 Minutes Intravenous  Once 08/22/17 1233 08/22/17 2103      Assessment/Plan: s/p Procedure(s): FLEXIBLE SIGMOIDOSCOPY Discontinue NGT.  This is not an ideal time to reoperate  Will continue TPN for now but discontinue NGT since the output has been  minimal. DBIV   LOS: 7 days    Kathryne Eriksson. [name], MD, FACS [phone] [facility] Surgery 08/29/2017

## 2017-08-29 NOTE — Progress Notes (Signed)
PHARMACY - ADULT TOTAL PARENTERAL NUTRITION CONSULT NOTE   Pharmacy Consult for TPN Indication: Bowel obstruction  Patient Measurements: Height: 5\' 5"  (165.1 cm) Weight: 216 lb 4.3 oz (98.1 kg) IBW/kg (Calculated) : 57 TPN AdjBW (KG): 67.3 Body mass index is 35.99 kg/m.  Assessment: 26 YOF who was recently discharged from the hospital for severe dehydration from gastroenteritis presented to [facility] ED with abdominal pain, bloating, nausea and vomiting. 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 CT abd on this admission was consistent with an obstruction at the anastomotic site of the recent colon-colon anastomosis with colonic dilatation and small-bowel dilatation.  GI: She currently has an NG tube in place with minimal output and a small amount of flatus yesterday morning. Planning possible surgery later this week. Albumin on admission was low at 2.7. Pre-albumin 12.8. NG tube output 150 mL. Discontinuing NGT today. No BM or flatus yet.  Endo: Type II DM, A1c 8.4 in December, CBGs 112-138 on resistant SSI  Insulin requirements in the past 24 hours: 12 units SSI  Lytes: K 3.8, Phos 3.7, Mg 1.5 Renal: No UOP charted, SCr improved to 1.29, Per patient, she is making good urine  Pulm: on RA  Cards: BP 138/68, HR wnl  Hepatobil: LFTs low to normal  ID: No issues, no abx   TPN Access: CVC 2/4>> TPN start date: 2/4>>  Nutritional Goals (per RD recommendations on 2/4): Kcal:  1750-1950 Protein:  100-115 grams Fluid:  > 1.7 L  Goal TPN rate is 100 ml/hr   Current Nutrition:  NPO  TPN @ 40 mL/hr  Plan:  Increase TPN to 80 mL/hr. This TPN provides 86 g of protein, 230 g of dextrose, and 38 g of lipids which provides 1468 kCals per day, meeting 83% of patient needs Electrolytes in TPN: Standard concentration except Na 60 mEq/L and increase Mg to 7 mEq/L. Cl:Ac 1:1 Mg sulfate 2 gm IV  Add MVI, trace elements to TPN Continue  resistant insulin sliding scale Decrease D5 1/2NS to [place], PharmD., BCPS Clinical Pharmacist Phone [phone]

## 2017-08-30 ENCOUNTER — Ambulatory Visit: Payer: Medicare Other | Admitting: Internal Medicine

## 2017-08-30 ENCOUNTER — Inpatient Hospital Stay (HOSPITAL_COMMUNITY): Payer: Medicare Other

## 2017-08-30 LAB — BASIC METABOLIC PANEL
Anion gap: 12 (ref 5–15)
BUN: 13 mg/dL (ref 6–20)
CALCIUM: 9.1 mg/dL (ref 8.9–10.3)
CO2: 22 mmol/L (ref 22–32)
CREATININE: 1.24 mg/dL — AB (ref 0.44–1.00)
Chloride: 101 mmol/L (ref 101–111)
GFR calc Af Amer: 51 mL/min — ABNORMAL LOW (ref >60)
GFR, EST NON AFRICAN AMERICAN: 44 mL/min — AB (ref >60)
Glucose, Bld: 181 mg/dL — ABNORMAL HIGH (ref 65–99)
Potassium: 4.1 mmol/L (ref 3.5–5.1)
Sodium: 135 mmol/L (ref 135–145)

## 2017-08-30 LAB — GLUCOSE, CAPILLARY
GLUCOSE-CAPILLARY: 139 mg/dL — AB (ref 65–99)
Glucose-Capillary: 178 mg/dL — ABNORMAL HIGH (ref 65–99)
Glucose-Capillary: 197 mg/dL — ABNORMAL HIGH (ref 65–99)
Glucose-Capillary: 171 mg/dL — ABNORMAL HIGH (ref 65–99)
Glucose-Capillary: 159 mg/dL — ABNORMAL HIGH (ref 65–99)
Glucose-Capillary: 165 mg/dL — ABNORMAL HIGH (ref 65–99)

## 2017-08-30 LAB — MAGNESIUM: Magnesium: 1.9 mg/dL (ref 1.7–2.4)

## 2017-08-30 LAB — CBC WITH DIFFERENTIAL/PLATELET
BASOS ABS: 0 10*3/uL (ref 0.0–0.1)
BASOS PCT: 0 %
EOS ABS: 0.2 10*3/uL (ref 0.0–0.7)
EOS PCT: 4 %
HEMATOCRIT: 32.2 % — AB (ref 36.0–46.0)
Hemoglobin: 10.3 g/dL — ABNORMAL LOW (ref 12.0–15.0)
Lymphocytes Relative: 29 %
Lymphs Abs: 1.5 10*3/uL (ref 0.7–4.0)
MCH: 26.5 pg (ref 26.0–34.0)
MCHC: 32 g/dL (ref 30.0–36.0)
MCV: 83 fL (ref 78.0–100.0)
MONO ABS: 0.6 10*3/uL (ref 0.1–1.0)
Monocytes Relative: 12 %
Neutro Abs: 2.7 10*3/uL (ref 1.7–7.7)
Neutrophils Relative %: 55 %
PLATELETS: 198 10*3/uL (ref 150–400)
RBC: 3.88 MIL/uL (ref 3.87–5.11)
RDW: 15.3 % (ref 11.5–15.5)
WBC: 5 10*3/uL (ref 4.0–10.5)

## 2017-08-30 MED ORDER — FEBUXOSTAT 40 MG PO TABS
40.0000 mg | ORAL_TABLET | Freq: Every day | ORAL | Status: DC
  Administered 2017-08-30 – 2017-09-04 (×6): 40 mg via ORAL
  Filled 2017-08-30 (×6): qty 1

## 2017-08-30 MED ORDER — BISACODYL 10 MG RE SUPP
10.0000 mg | Freq: Once | RECTAL | Status: AC
  Administered 2017-08-30: 10 mg via RECTAL
  Filled 2017-08-30: qty 1

## 2017-08-30 MED ORDER — MORPHINE SULFATE (PF) 4 MG/ML IV SOLN: 1.0000 mg | INTRAVENOUS | Status: DC | PRN

## 2017-08-30 MED ORDER — TRAVASOL 10 % IV SOLN
INTRAVENOUS | Status: AC
  Administered 2017-08-30: 18:00:00 via INTRAVENOUS
  Filled 2017-08-30: qty 1080

## 2017-08-30 NOTE — Progress Notes (Signed)
Subjective: ABM cramping with the suppositories.  Objective: Vital signs in last 24 hours: Temp:  [99.5 F (37.5 C)-100.6 F (38.1 C)] 100.6 F (38.1 C) (02/06 1623) Pulse Rate:  [85-99] 99 (02/06 1623) Resp:  [18] 18 (02/06 1623) BP: (137-150)/(61-72) 137/69 (02/06 1623) SpO2:  [98 %] 98 % (02/06 1623) Last BM Date: 08/24/17  Intake/Output from previous day: 02/05 0701 - 02/06 0700 In: 1020.5 [I.V.:1020.5] Out: -  Intake/Output this shift: No intake/output data recorded.  General appearance: alert and no distress GI: distended, soft, tympanic, +BS  Lab Results: Recent Labs    08/28/17 0616 08/29/17 0546 08/30/17 0442  WBC 5.3 5.3 5.0  HGB 10.6* 10.1* 10.3*  HCT 33.4* 32.3* 32.2*  PLT 192 183 198   BMET Recent Labs    08/28/17 0616 08/29/17 0546 08/30/17 0442  NA 138 138 135  K 3.8 3.8 4.1  CL 105 103 101  CO2 22 24 22   GLUCOSE 124* 136* 181*  BUN 8 7 13   CREATININE 1.35* 1.29* 1.24*  CALCIUM 9.0 9.0 9.1   LFT Recent Labs    08/29/17 0546  PROT 6.3*  ALBUMIN 3.0*  AST 12*  ALT 8*  ALKPHOS 67  BILITOT 0.8   PT/INR No results for input(s): LABPROT, INR in the last 72 hours. Hepatitis Panel No results for input(s): HEPBSAG, HCVAB, HEPAIGM, HEPBIGM in the last 72 hours. C-Diff No results for input(s): CDIFFTOX in the last 72 hours. Fecal Lactopherrin No results for input(s): FECLLACTOFRN in the last 72 hours.  Studies/Results: Dg Abd Portable 1v  Result Date: 08/30/2017 CLINICAL DATA:  Small bowel obstruction. EXAM: PORTABLE ABDOMEN - 1 VIEW COMPARISON:  Radiographs of August 28, 2017. FINDINGS: Nasogastric tube is not clearly visualized on this study. Small bowel dilatation is seen in the pelvis as well as probable colonic dilatation in the right lower quadrant. IMPRESSION: Stable colonic and small bowel dilatation is noted compared to prior exam. Nasogastric tube is not seen currently. Electronically Signed   By: Marijo Conception, M.D.   On:  08/30/2017 10:06    Medications:  Scheduled: . febuxostat  40 mg Oral Daily  . insulin aspart  0-20 Units Subcutaneous Q4H  . levothyroxine  44 mcg Intravenous Daily  . pantoprazole (PROTONIX) IV  40 mg Intravenous QHS  . sodium chloride flush  10-40 mL Intracatheter Q12H   Continuous: . dextrose 5 % and 0.45% NaCl 10 mL/hr at 08/29/17 1804  . TPN ADULT (ION) 100 mL/hr at 08/30/17 1759    Assessment/Plan: 1) Anastamotic stricture with obstruction.   She has not had any improvement after the dilation.  Dr. Hulen Skains discussed her case with me today.  I think it is worthwhile to repeat the FFS, but to place a stent.  This can help to open up the area, but there are pitfalls associated with stent placement.  The stent can migrate or perforate.  The diameter of the stent is much greater than the prior dilation at 13 mm, however, a repeat dilation will only result in immediate closure of the stricture.  The stricture is pliable and not apt to stay open with dilations.  The risks and benefits were discussed with the patient and she agrees to undergo the stent placement.  Dr. Hulen Skains is also aware of the stent placement and hopefully it will be successful to keep the anastamosis open or to allow a bridge for surgical intervention.  LOS: 8 days   Rebekkah Powless D 08/30/2017, 7:23 PM

## 2017-08-30 NOTE — Progress Notes (Signed)
Nutrition Follow-up  DOCUMENTATION CODES:   Obesity unspecified  INTERVENTION:   -TPN management per pharmacy -RD will follow for diet advancement  NUTRITION DIAGNOSIS:   Inadequate oral intake related to altered GI function as evidenced by NPO status.  Ongoing  GOAL:   Patient will meet greater than or equal to 90% of their needs  Progressing  MONITOR:   Diet advancement, Labs, Weight trends, Skin, I & O's  REASON FOR ASSESSMENT:   Consult New TPN/TNA  ASSESSMENT:   Pt is a a 67 y.o. female with past medical history significant for asthma, heart failure, pacemaker, CKD, depression, diabetes who was recently discharged from the hospital for hypovolemia and hypotension 2/2 severe dehydration from gastroenteritis who presented to [facility] ED today with complaints of abdominal pain, bloating, nausea and vomiting. Pt had a colostomy reversal on December 20th by Dr. Hulen Skains.   1/29- NGT placed 1/31- s/p FFS with dilation for anastomotic stricture per GI 2/4- central venous catheter placed 2/5- NGT removed due to low output  Pt remains NPO; receiving TPN for nutrition support. Pt receiving TPN at 80 ml/hr, which provides 86 g of protein, 230 g of dextrose, and 38 g of lipids which provides 1468 kCals per day, meeting 83% of patient needs. Per pharmacy notes, plan to increase TPN tonight to 100 ml/hr. New regimen to provide 108 g of protein, 288 g of dextrose, and 48 g of lipids which provides 1891 kCals per day, meeting 100% of patient needs.  Labs reviewed: CBGS: 171-107 (inpatient orders for glycemic control are 0-20 units insulin aspart every 4 hours).   Diet Order:  TPN ADULT (ION) Diet NPO time specified Except for: Sips with Meds TPN ADULT (ION)  EDUCATION NEEDS:   Education needs have been addressed  Skin:  Skin Assessment: Reviewed RN Assessment  Last BM:  08/24/17  Height:   Ht Readings from Last 1 Encounters:  08/23/17 5\' 5"  (1.651 m)    Weight:   Wt  Readings from Last 1 Encounters:  08/23/17 216 lb 4.3 oz (98.1 kg)    Ideal Body Weight:  56.8 kg  BMI:  Body mass index is 35.99 kg/m.  Estimated Nutritional Needs:   Kcal:  9201-0071  Protein:  100-115 grams  Fluid:  > 1.7 L    Zia Kanner A. Jimmye Norman, RD, LDN, CDE Pager: [phone] After hours Pager: [phone]

## 2017-08-30 NOTE — Progress Notes (Signed)
PHARMACY - ADULT TOTAL PARENTERAL NUTRITION CONSULT NOTE   Pharmacy Consult for TPN Indication: Bowel obstruction  Patient Measurements: Height: 5\' 5"  (165.1 cm) Weight: 216 lb 4.3 oz (98.1 kg) IBW/kg (Calculated) : 57 TPN AdjBW (KG): 67.3 Body mass index is 35.99 kg/m.  Assessment: 24 YOF who was recently discharged from the hospital for severe dehydration from gastroenteritis presented to [facility] ED with abdominal pain, bloating, nausea and vomiting. 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 CT abd on this admission was consistent with an obstruction at the anastomotic site of the recent colon-colon anastomosis with colonic dilatation and small-bowel dilatation.  GI: Planning possible surgery later this week. Albumin on admission was low at 2.7. Pre-albumin 12.8.Discontinuing NGT today. No BM or flatus yet; last noted 1/31. F/u results of Dulcolax supp ordered today. Bloated - IV PPI  Endo: IV Synthroid. Po Uloric for gout. Type II DM, A1c 8.4 in December, CBGs 128-197 on resistant SSI  Insulin requirements in the past 24 hours: 21 units SSI   Lytes: K 4.1, Phos 3.7, Mg 1.5>1.9 today Renal: SCr improved to 1.24, UOP not measured Pulm: on RA  Cards: VSS Hepatobil: LFTs low to normal  ID: No issues, no abx   TPN Access: CVC 2/4>> TPN start date: 2/4>>  Nutritional Goals (per RD recommendations on 2/4): Kcal:  1750-1950 Protein:  100-115 grams Fluid:  > 1.7 L  Goal TPN rate is 100 ml/hr   Current Nutrition:  NPO  TPN @ 40 mL/hr  Plan:  Increase TPN to 100 mL/hr. This TPN provides 108 g of protein, 288 g of dextrose, and 48 g of lipids which provides 1891 kCals per day, meeting 100% of patient needs Electrolytes in TPN: Standard concentration except Na 60 mEq/L and increase Mg to 7 mEq/L. Cl:Ac 1:1 Add MVI, trace elements to TPN Continue resistant insulin sliding scale- add insulin to TPN 15 units Decrease D5 1/2NS to  [facility] Monitor TPN labs F/u results of Dulcolax supp 2/6   Sham Alviar S. Alford Highland, PharmD, [facility] Clinical Staff Pharmacist Pager [phone]  Phone [phone]

## 2017-08-30 NOTE — Progress Notes (Signed)
GS Progress Note Subjective: Patient got very bloated last night, but better without flatus or bowel movement.  Still bloated this AM  Objective: Vital signs in last 24 hours: Temp:  [99 F (37.2 C)-99.6 F (37.6 C)] 99.6 F (37.6 C) (02/06 0434) Pulse Rate:  [81-89] 85 (02/06 0434) Resp:  [18] 18 (02/06 0434) BP: (139-150)/(61-74) 139/61 (02/06 0434) SpO2:  [98 %-99 %] 98 % (02/06 0434) Last BM Date: 08/24/17  Intake/Output from previous day: 02/05 0701 - 02/06 0700 In: 1020.5 [I.V.:1020.5] Out: -  Intake/Output this shift: No intake/output data recorded.  Lungs: Clear  Abd: Distended with hypoactive bowel sounds.  Extremities: No changes.  Swollen tender MT right foot.  Neuro: Intact  Lab Results: CBC  Recent Labs    08/29/17 0546 08/30/17 0442  WBC 5.3 5.0  HGB 10.1* 10.3*  HCT 32.3* 32.2*  PLT 183 198   BMET Recent Labs    08/29/17 0546 08/30/17 0442  NA 138 135  K 3.8 4.1  CL 103 101  CO2 24 22  GLUCOSE 136* 181*  BUN 7 13  CREATININE 1.29* 1.24*  CALCIUM 9.0 9.1   PT/INR No results for input(s): LABPROT, INR in the last 72 hours. ABG No results for input(s): PHART, HCO3 in the last 72 hours.  Invalid input(s): PCO2, PO2  Studies/Results: Dg Chest Port 1 View  Result Date: 08/28/2017 CLINICAL DATA:  Central line placement EXAM: PORTABLE CHEST 1 VIEW COMPARISON:  08/26/2017 FINDINGS: Right subclavian central venous catheter tip in the lower SVC in good position. No pneumothorax AICD unchanged.  NG in the stomach. Cardiac enlargement without heart failure.  Lungs are clear IMPRESSION: Satisfactory central line placement.  Lungs are clear. Electronically Signed   By: Franchot Gallo M.D.   On: 08/28/2017 10:50    Anti-infectives: Anti-infectives (From admission, onward)   Start     Dose/Rate Route Frequency Ordered Stop   08/24/17 1300  vancomycin (VANCOCIN) IVPB 1000 mg/200 mL premix  Status:  Discontinued     1,000 mg 200 mL/hr over 60  Minutes Intravenous Every 48 hours 08/22/17 1250 08/22/17 2103   08/24/17 0230  piperacillin-tazobactam (ZOSYN) IVPB 3.375 g  Status:  Discontinued     3.375 g 12.5 mL/hr over 240 Minutes Intravenous Every 8 hours 08/23/17 2025 08/24/17 0819   08/22/17 1830  piperacillin-tazobactam (ZOSYN) IVPB 2.25 g  Status:  Discontinued     2.25 g 100 mL/hr over 30 Minutes Intravenous Every 8 hours 08/22/17 1250 08/23/17 2025   08/22/17 1245  piperacillin-tazobactam (ZOSYN) IVPB 3.375 g     3.375 g 100 mL/hr over 30 Minutes Intravenous  Once 08/22/17 1231 08/22/17 1305   08/22/17 1245  vancomycin (VANCOCIN) 2,000 mg in sodium chloride 0.9 % 500 mL IVPB  Status:  Discontinued     2,000 mg 250 mL/hr over 120 Minutes Intravenous  Once 08/22/17 1233 08/22/17 2103      Assessment/Plan: s/p Procedure(s): FLEXIBLE SIGMOIDOSCOPY Dulcolax suppository.  maybe an enema.  Says may be having gout attack of her right foot.  Will allow sips with meds.    LOS: 8 days    Kathryne Eriksson. [name], MD, FACS [phone] [facility] 08/30/2017

## 2017-08-31 ENCOUNTER — Encounter (HOSPITAL_COMMUNITY)
Admission: EM | Disposition: A | Discharge status: Home / Self Care | Payer: Self-pay | Source: Home / Self Care | Attending: General Surgery

## 2017-08-31 ENCOUNTER — Inpatient Hospital Stay (HOSPITAL_COMMUNITY): Payer: Medicare Other

## 2017-08-31 ENCOUNTER — Encounter (HOSPITAL_COMMUNITY): Payer: Self-pay | Admitting: *Deleted

## 2017-08-31 HISTORY — PX: COLONIC STENT PLACEMENT: SHX5542

## 2017-08-31 HISTORY — PX: FLEXIBLE SIGMOIDOSCOPY: SHX5431

## 2017-08-31 LAB — GLUCOSE, CAPILLARY
GLUCOSE-CAPILLARY: 138 mg/dL — AB (ref 65–99)
GLUCOSE-CAPILLARY: 144 mg/dL — AB (ref 65–99)
GLUCOSE-CAPILLARY: 159 mg/dL — AB (ref 65–99)
GLUCOSE-CAPILLARY: 169 mg/dL — AB (ref 65–99)
Glucose-Capillary: 170 mg/dL — ABNORMAL HIGH (ref 65–99)
Glucose-Capillary: 180 mg/dL — ABNORMAL HIGH (ref 65–99)

## 2017-08-31 LAB — COMPREHENSIVE METABOLIC PANEL
ALBUMIN: 3.1 g/dL — AB (ref 3.5–5.0)
ALT: 8 U/L — ABNORMAL LOW (ref 14–54)
ANION GAP: 11 (ref 5–15)
AST: 12 U/L — ABNORMAL LOW (ref 15–41)
Alkaline Phosphatase: 71 U/L (ref 38–126)
BUN: 22 mg/dL — AB (ref 6–20)
CHLORIDE: 101 mmol/L (ref 101–111)
CO2: 22 mmol/L (ref 22–32)
Calcium: 9.1 mg/dL (ref 8.9–10.3)
Creatinine, Ser: 1.3 mg/dL — ABNORMAL HIGH (ref 0.44–1.00)
GFR calc Af Amer: 48 mL/min — ABNORMAL LOW (ref >60)
GFR, EST NON AFRICAN AMERICAN: 41 mL/min — AB (ref >60)
GLUCOSE: 153 mg/dL — AB (ref 65–99)
POTASSIUM: 4.4 mmol/L (ref 3.5–5.1)
Sodium: 134 mmol/L — ABNORMAL LOW (ref 135–145)
Total Bilirubin: 0.6 mg/dL (ref 0.3–1.2)
Total Protein: 6.5 g/dL (ref 6.5–8.1)

## 2017-08-31 LAB — PHOSPHORUS: Phosphorus: 4.1 mg/dL (ref 2.5–4.6)

## 2017-08-31 LAB — MAGNESIUM: MAGNESIUM: 1.7 mg/dL (ref 1.7–2.4)

## 2017-08-31 SURGERY — SIGMOIDOSCOPY, FLEXIBLE

## 2017-08-31 MED ORDER — MIDAZOLAM HCL 5 MG/ML IJ SOLN
INTRAMUSCULAR | Status: AC
  Filled 2017-08-31: qty 2

## 2017-08-31 MED ORDER — SODIUM CHLORIDE 0.9 % IV SOLN: INTRAVENOUS | Status: DC

## 2017-08-31 MED ORDER — SODIUM CHLORIDE 0.9 % IV SOLN
INTRAVENOUS | Status: DC
  Administered 2017-08-31: 12:00:00 via INTRAVENOUS

## 2017-08-31 MED ORDER — FENTANYL CITRATE (PF) 100 MCG/2ML IJ SOLN
INTRAMUSCULAR | Status: AC
  Filled 2017-08-31: qty 4

## 2017-08-31 MED ORDER — TRAVASOL 10 % IV SOLN
INTRAVENOUS | Status: AC
  Administered 2017-08-31: 18:00:00 via INTRAVENOUS
  Filled 2017-08-31: qty 1080

## 2017-08-31 MED ORDER — ONDANSETRON HCL 4 MG/2ML IJ SOLN
INTRAMUSCULAR | Status: AC
  Filled 2017-08-31: qty 2

## 2017-08-31 MED ORDER — IOPAMIDOL (ISOVUE-300) INJECTION 61%
INTRAVENOUS | Status: AC
  Filled 2017-08-31: qty 50

## 2017-08-31 NOTE — Progress Notes (Signed)
PHARMACY - ADULT TOTAL PARENTERAL NUTRITION CONSULT NOTE   Pharmacy Consult for TPN Indication: Bowel obstruction  Patient Measurements: Height: 5\' 5"  (165.1 cm) Weight: 216 lb 4.3 oz (98.1 kg) IBW/kg (Calculated) : 57 TPN AdjBW (KG): 67.3 Body mass index is 35.99 kg/m.  Assessment: 60 YOF who was recently discharged from the hospital for severe dehydration from gastroenteritis presented to [facility] ED with abdominal pain, bloating, nausea and vomiting. 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 CT abd on this admission was consistent with an obstruction at the anastomotic site of the recent colon-colon anastomosis with colonic dilatation and small-bowel dilatation.  GI: Planning possible surgery later this week. Albumin on admission was low at 2.7. Pre-albumin 12.8.Discontinuing NGT today. Dulcolax suppository 2/6>>+BM. - IV PPI  Endo: IV Synthroid. Po Uloric for gout. Type II DM, A1c 8.4 in December, CBGs 138-197 on resistant SSI  Insulin requirements in the past 24 hours: 26 units SSI   Lytes: Na 134 declining, K 4.4, Phos 4.1, Mg 1.7 low normal today Renal: SCr 1.3 stable , UOP not measured Pulm: on RA  Cards: VSS Hepatobil: LFTs low to normal  ID: No issues, no abx   TPN Access: CVC 2/4>> TPN start date: 2/4>>  Nutritional Goals (per RD recommendations on 2/6): Kcal:  1750-1950 Protein:  100-115 grams Fluid:  > 1.7 L  Goal TPN rate is 100 ml/hr   Current Nutrition:  NPO   Plan:  Goal TPN at 100 mL/hr. --This TPN provides 108 g of protein, 288 g of dextrose, and 48 g of lipids which provides 1891 kCals per day, meeting 100% of patient needs Electrolytes in TPN: Standard concentration except Na 75 mEq/L and increase Mg to 15 mEq/L. Cl:Ac 1:1 Add MVI, trace elements to TPN Continue resistant insulin sliding scale- increase insulin in TPN 25 units Change IVF to NS at kvo (due to low Na, removes dextrose) Monitor TPN  labs prn- BMET and Mg 2/8    Tyshan Enderle S. Alford Highland, PharmD, [facility] Clinical Staff Pharmacist Pager [phone]  Phone [phone]

## 2017-08-31 NOTE — Progress Notes (Signed)
GS Progress Note Subjective: Appreciate the note from Dr. Benson Norway.  She is scheduled for 1500 today.  Objective: Vital signs in last 24 hours: Temp:  [99.5 F (37.5 C)-100.6 F (38.1 C)] 99.5 F (37.5 C) (02/07 0742) Pulse Rate:  [89-103] 103 (02/07 0742) Resp:  [18] 18 (02/07 0742) BP: (117-140)/(62-94) 140/64 (02/07 0742) SpO2:  [95 %-98 %] 97 % (02/07 0742) Last BM Date: 08/30/17(very small)  Intake/Output from previous day: 02/06 0701 - 02/07 0700 In: 1502.2 [I.V.:1502.2] Out: -  Intake/Output this shift: No intake/output data recorded.  Lungs: Clear to ausculation  Abd: Hypoactive bowel sounds..  Extremities: No changes  Neuro: Intact  Lab Results: CBC  Recent Labs    08/29/17 0546 08/30/17 0442  WBC 5.3 5.0  HGB 10.1* 10.3*  HCT 32.3* 32.2*  PLT 183 198   BMET Recent Labs    08/30/17 0442 08/31/17 0355  NA 135 134*  K 4.1 4.4  CL 101 101  CO2 22 22  GLUCOSE 181* 153*  BUN 13 22*  CREATININE 1.24* 1.30*  CALCIUM 9.1 9.1   PT/INR No results for input(s): LABPROT, INR in the last 72 hours. ABG No results for input(s): PHART, HCO3 in the last 72 hours.  Invalid input(s): PCO2, PO2  Studies/Results: Dg Abd Portable 1v  Result Date: 08/30/2017 CLINICAL DATA:  Small bowel obstruction. EXAM: PORTABLE ABDOMEN - 1 VIEW COMPARISON:  Radiographs of August 28, 2017. FINDINGS: Nasogastric tube is not clearly visualized on this study. Small bowel dilatation is seen in the pelvis as well as probable colonic dilatation in the right lower quadrant. IMPRESSION: Stable colonic and small bowel dilatation is noted compared to prior exam. Nasogastric tube is not seen currently. Electronically Signed   By: Marijo Conception, M.D.   On: 08/30/2017 10:06    Anti-infectives: Anti-infectives (From admission, onward)   Start     Dose/Rate Route Frequency Ordered Stop   08/24/17 1300  vancomycin (VANCOCIN) IVPB 1000 mg/200 mL premix  Status:  Discontinued     1,000 mg 200  mL/hr over 60 Minutes Intravenous Every 48 hours 08/22/17 1250 08/22/17 2103   08/24/17 0230  piperacillin-tazobactam (ZOSYN) IVPB 3.375 g  Status:  Discontinued     3.375 g 12.5 mL/hr over 240 Minutes Intravenous Every 8 hours 08/23/17 2025 08/24/17 0819   08/22/17 1830  piperacillin-tazobactam (ZOSYN) IVPB 2.25 g  Status:  Discontinued     2.25 g 100 mL/hr over 30 Minutes Intravenous Every 8 hours 08/22/17 1250 08/23/17 2025   08/22/17 1245  piperacillin-tazobactam (ZOSYN) IVPB 3.375 g     3.375 g 100 mL/hr over 30 Minutes Intravenous  Once 08/22/17 1231 08/22/17 1305   08/22/17 1245  vancomycin (VANCOCIN) 2,000 mg in sodium chloride 0.9 % 500 mL IVPB  Status:  Discontinued     2,000 mg 250 mL/hr over 120 Minutes Intravenous  Once 08/22/17 1233 08/22/17 2103      Assessment/Plan: s/p Procedure(s): FLEXIBLE SIGMOIDOSCOPY Keep NPO  For endoscopy with dilatation, possible stent placement. If this is not successful, will need surgery next week.  LOS: 9 days    Kathryne Eriksson. [name], MD, FACS [phone] [facility] 08/31/2017

## 2017-08-31 NOTE — Op Note (Signed)
[facility] Patient Name: Crystal [name] Procedure Date : 08/31/2017 MRN: [id number] Attending MD: Carol Ada , MD Date of Birth: 1950-09-23 CSN: [id number] Age: 67 Admit Type: Inpatient Procedure:                Flexible Sigmoidoscopy Indications:              Abnormal barium enema Providers:                Carol Ada, MD, Cleda Daub, RN, Presley Raddle, RN, Tinnie Gens, Technician Referring MD:              Medicines:                None Complications:            No immediate complications. Estimated Blood Loss:     Estimated blood loss was minimal. Procedure:                Pre-Anesthesia Assessment:                           - Prior to the procedure, a History and Physical                            was performed, and patient medications and                            allergies were reviewed. The patient's tolerance of                            previous anesthesia was also reviewed. The risks                            and benefits of the procedure and the sedation                            options and risks were discussed with the patient.                            All questions were answered, and informed consent                            was obtained. Prior Anticoagulants: The patient has                            taken no previous anticoagulant or antiplatelet                            agents. ASA Grade Assessment: II - A patient with                            mild systemic disease. After reviewing the risks  and benefits, the patient was deemed in                            satisfactory condition to undergo the procedure.                           After obtaining informed consent, the scope was                            passed under direct vision. The Endoscope was                            introduced through the anus and advanced to the the                            left transverse colon. The  flexible sigmoidoscopy                            was accomplished without difficulty. The patient                            tolerated the procedure well. The quality of the                            bowel preparation was adequate. The patient                            tolerated the procedure well. Scope In: Scope Out: Findings:      A benign-appearing, intrinsic severe stenosis measuring 2 cm (in length)       x 5 mm (inner diameter) was found in the sigmoid colon and was       traversed. This was stented with a 22 mm x 6 cm WallFlex stent under       fluoroscopic guidance. Estimated blood loss was minimal.      Using the larger channel therapeutic endoscope there was evidence of       fecal matter in the distal colon suggesting passage of stool through the       stenosis. This was not noted one week ago. The stenosis was again       identified and using a column of water the endoscope was able to be       easily advanced through the pliable stenosis. Using a combination of       gross visualization and fluoroscopic images, the stent was deployed with       ease across the anastamotic stricture. Excellent placement was achieved       and the patient did not report any abdominal pain. In fact, she reported       that her abdomen was not as tense. The mucosa in the distal colon was       noted to be friable as well as the anastamotic stricture. 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                           -  No specimens collected. Recommendation:           - Return patient to hospital ward for ongoing care.                           - NPO.                           - This is an uncovered stent and it should be                            removed within the next 2 weeks to minimze any                            mucosal ingrowth or attachment to the stent. Procedure Code(s):        --- Professional ---                           [phone],  Sigmoidoscopy, flexible; with placement of                            endoscopic stent (includes pre- and post-dilation                            and guide wire passage, when performed) Diagnosis Code(s):        --- Professional ---                           K56.69, Other intestinal obstruction                           R93.3, Abnormal findings on diagnostic imaging of                            other parts of digestive tract CPT copyright 2016 [employer]. All rights reserved. The codes documented in this report are preliminary and upon coder review may  be revised to meet current compliance requirements. Carol Ada, MD Carol Ada, MD 08/31/2017 4:03:44 PM This report has been signed electronically. Number of Addenda: 0

## 2017-09-01 ENCOUNTER — Encounter (HOSPITAL_COMMUNITY): Payer: Self-pay | Admitting: Gastroenterology

## 2017-09-01 LAB — BASIC METABOLIC PANEL
ANION GAP: 10 (ref 5–15)
BUN: 26 mg/dL — ABNORMAL HIGH (ref 6–20)
CHLORIDE: 103 mmol/L (ref 101–111)
CO2: 22 mmol/L (ref 22–32)
Calcium: 9.2 mg/dL (ref 8.9–10.3)
Creatinine, Ser: 1.31 mg/dL — ABNORMAL HIGH (ref 0.44–1.00)
GFR calc non Af Amer: 41 mL/min — ABNORMAL LOW (ref >60)
GFR, EST AFRICAN AMERICAN: 48 mL/min — AB (ref >60)
Glucose, Bld: 163 mg/dL — ABNORMAL HIGH (ref 65–99)
POTASSIUM: 4.3 mmol/L (ref 3.5–5.1)
Sodium: 135 mmol/L (ref 135–145)

## 2017-09-01 LAB — CBC WITH DIFFERENTIAL/PLATELET
Basophils Absolute: 0 10*3/uL (ref 0.0–0.1)
Basophils Relative: 1 %
EOS ABS: 0.2 10*3/uL (ref 0.0–0.7)
EOS PCT: 5 %
HCT: 31.1 % — ABNORMAL LOW (ref 36.0–46.0)
Hemoglobin: 9.9 g/dL — ABNORMAL LOW (ref 12.0–15.0)
LYMPHS ABS: 1.7 10*3/uL (ref 0.7–4.0)
Lymphocytes Relative: 38 %
MCH: 26.3 pg (ref 26.0–34.0)
MCHC: 31.8 g/dL (ref 30.0–36.0)
MCV: 82.7 fL (ref 78.0–100.0)
MONOS PCT: 19 %
Monocytes Absolute: 0.8 10*3/uL (ref 0.1–1.0)
Neutro Abs: 1.6 10*3/uL — ABNORMAL LOW (ref 1.7–7.7)
Neutrophils Relative %: 37 %
Platelets: 173 10*3/uL (ref 150–400)
RBC: 3.76 MIL/uL — ABNORMAL LOW (ref 3.87–5.11)
RDW: 15.2 % (ref 11.5–15.5)
WBC: 4.4 10*3/uL (ref 4.0–10.5)

## 2017-09-01 LAB — GLUCOSE, CAPILLARY
GLUCOSE-CAPILLARY: 167 mg/dL — AB (ref 65–99)
GLUCOSE-CAPILLARY: 171 mg/dL — AB (ref 65–99)
GLUCOSE-CAPILLARY: 174 mg/dL — AB (ref 65–99)
GLUCOSE-CAPILLARY: 181 mg/dL — AB (ref 65–99)
GLUCOSE-CAPILLARY: 186 mg/dL — AB (ref 65–99)
GLUCOSE-CAPILLARY: 193 mg/dL — AB (ref 65–99)
Glucose-Capillary: 162 mg/dL — ABNORMAL HIGH (ref 65–99)

## 2017-09-01 LAB — MAGNESIUM: Magnesium: 2 mg/dL (ref 1.7–2.4)

## 2017-09-01 MED ORDER — TRAVASOL 10 % IV SOLN
INTRAVENOUS | Status: AC
  Administered 2017-09-01: 17:00:00 via INTRAVENOUS
  Filled 2017-09-01: qty 1080

## 2017-09-01 NOTE — Progress Notes (Signed)
PHARMACY - ADULT TOTAL PARENTERAL NUTRITION CONSULT NOTE   Pharmacy Consult for TPN Indication: Bowel obstruction  Patient Measurements: Height: 5\' 5"  (165.1 cm) Weight: 216 lb 4.3 oz (98.1 kg) IBW/kg (Calculated) : 57 TPN AdjBW (KG): 67.3 Body mass index is 35.99 kg/m.  Assessment: 33 YOF who was recently discharged from the hospital for severe dehydration from gastroenteritis presented to [facility] ED with abdominal pain, bloating, nausea and vomiting. 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 CT abd on this admission was consistent with an obstruction at the anastomotic site of the recent colon-colon anastomosis with colonic dilatation and small-bowel dilatation.  GI: Planning possible surgery later this week. Albumin on admission was low at 2.7. Pre-albumin 12.8. NGT discontinued. LBM 2/8. Tolerated clear liquids yesterday  - IV PPI  Endo: IV Synthroid. Po Uloric for gout. Type II DM, A1c 8.4 in December, CBGs 159-180 on resistant SSI  Insulin requirements in the past 24 hours: 27 units SSI   Lytes: Na 134>135, K 4.3, Phos 4.1, Mg 2 Renal: SCr 1.31 stable , UOP not measured Pulm: on RA  Cards: VSS Hepatobil: LFTs low to normal, TG 218 ID: No issues, no abx   TPN Access: CVC 2/4>> TPN start date: 2/4>>  Nutritional Goals (per RD recommendations on 2/6): Kcal:  1750-1950 Protein:  100-115 grams Fluid:  > 1.7 L  Goal TPN rate is 100 ml/hr   Current Nutrition:  Clear liquid diet    Plan:  Goal TPN at 100 mL/hr. --This TPN provides 108 g of protein, 288 g of dextrose, and 48 g of lipids which provides 1891 kCals per day, meeting 100% of patient needs Electrolytes in TPN: Standard concentration except Na 75 mEq/L and Mg 15 mEq/L. Cl:Ac 1:1 Add MVI, trace elements to TPN Continue resistant insulin sliding scale- insulin in TPN 25 units Change IVF to NS at kvo (due to low Na, removes dextrose) Monitor TPN labs prn- BMET  and Mg 2/8 Advancing diet to full liquids today. [place], PharmD., BCPS Clinical Pharmacist Pager [phone]  Phone [phone]

## 2017-09-01 NOTE — Progress Notes (Signed)
GS Progress Note Subjective: Patient has done very well with the repeat colonic anastomotic tricutre dilatation and stenting.  This is a temporary stent and should be removed in a few weeks.   Objective: Vital signs in last 24 hours: Temp:  [98.5 F (36.9 C)-100.1 F (37.8 C)] 98.5 F (36.9 C) (02/08 0421) Pulse Rate:  [79-100] 79 (02/08 0421) Resp:  [18-21] 18 (02/08 0421) BP: (127-161)/(50-85) 127/50 (02/08 0421) SpO2:  [94 %-99 %] 99 % (02/08 0421) Last BM Date: 09/01/17  Intake/Output from previous day: 02/07 0701 - 02/08 0700 In: 3187.3 [P.O.:960; I.V.:2227.3] Out: -  Intake/Output this shift: No intake/output data recorded.  Lungs: clear to auscultation  Abd: Soft, good bowel sounds.  COmpletely not tender  Extremities: No changes  Neuro: Intact  Lab Results: CBC  Recent Labs    08/30/17 0442 09/01/17 0304  WBC 5.0 4.4  HGB 10.3* 9.9*  HCT 32.2* 31.1*  PLT 198 173   BMET Recent Labs    08/31/17 0355 09/01/17 0304  NA 134* 135  K 4.4 4.3  CL 101 103  CO2 22 22  GLUCOSE 153* 163*  BUN 22* 26*  CREATININE 1.30* 1.31*  CALCIUM 9.1 9.2   PT/INR No results for input(s): LABPROT, INR in the last 72 hours. ABG No results for input(s): PHART, HCO3 in the last 72 hours.  Invalid input(s): PCO2, PO2  Studies/Results: Dg Abd 2 Views  Result Date: 08/31/2017 CLINICAL DATA:  Colonic obstruction, stent placement EXAM: ABDOMEN - 2 VIEW; DG C-ARM 61-120 MIN COMPARISON:  None. FLUOROSCOPY TIME:  Fluoroscopy Time:  35 seconds Radiation Exposure Index (if provided by the fluoroscopic device): Not available Number of Acquired Spot Images: 8 FINDINGS: Multiple spot films were obtained during colonic stent placement in an area of anastomotic stricture as previously seen. IMPRESSION: Colonic stent placement Electronically Signed   By: Inez Catalina M.D.   On: 08/31/2017 16:27   Dg Abd Portable 1v  Result Date: 08/30/2017 CLINICAL DATA:  Small bowel obstruction. EXAM:  PORTABLE ABDOMEN - 1 VIEW COMPARISON:  Radiographs of August 28, 2017. FINDINGS: Nasogastric tube is not clearly visualized on this study. Small bowel dilatation is seen in the pelvis as well as probable colonic dilatation in the right lower quadrant. IMPRESSION: Stable colonic and small bowel dilatation is noted compared to prior exam. Nasogastric tube is not seen currently. Electronically Signed   By: Marijo Conception, M.D.   On: 08/30/2017 10:06   Dg C-arm 1-60 Min  Result Date: 08/31/2017 CLINICAL DATA:  Colonic obstruction, stent placement EXAM: ABDOMEN - 2 VIEW; DG C-ARM 61-120 MIN COMPARISON:  None. FLUOROSCOPY TIME:  Fluoroscopy Time:  35 seconds Radiation Exposure Index (if provided by the fluoroscopic device): Not available Number of Acquired Spot Images: 8 FINDINGS: Multiple spot films were obtained during colonic stent placement in an area of anastomotic stricture as previously seen. IMPRESSION: Colonic stent placement Electronically Signed   By: Inez Catalina M.D.   On: 08/31/2017 16:27    Anti-infectives: Anti-infectives (From admission, onward)   Start     Dose/Rate Route Frequency Ordered Stop   08/24/17 1300  vancomycin (VANCOCIN) IVPB 1000 mg/200 mL premix  Status:  Discontinued     1,000 mg 200 mL/hr over 60 Minutes Intravenous Every 48 hours 08/22/17 1250 08/22/17 2103   08/24/17 0230  piperacillin-tazobactam (ZOSYN) IVPB 3.375 g  Status:  Discontinued     3.375 g 12.5 mL/hr over 240 Minutes Intravenous Every 8 hours 08/23/17 2025 08/24/17  9432   08/22/17 1830  piperacillin-tazobactam (ZOSYN) IVPB 2.25 g  Status:  Discontinued     2.25 g 100 mL/hr over 30 Minutes Intravenous Every 8 hours 08/22/17 1250 08/23/17 2025   08/22/17 1245  piperacillin-tazobactam (ZOSYN) IVPB 3.375 g     3.375 g 100 mL/hr over 30 Minutes Intravenous  Once 08/22/17 1231 08/22/17 1305   08/22/17 1245  vancomycin (VANCOCIN) 2,000 mg in sodium chloride 0.9 % 500 mL IVPB  Status:  Discontinued     2,000  mg 250 mL/hr over 120 Minutes Intravenous  Once 08/22/17 1233 08/22/17 2103      Assessment/Plan: s/p Procedure(s): FLEXIBLE SIGMOIDOSCOPY COLONIC STENT PLACEMENT Advance diet  Continue TPN for now.  LOS: 10 days    Kathryne Eriksson. [name], MD, FACS [phone] [facility] Surgery 09/01/2017

## 2017-09-01 NOTE — Progress Notes (Signed)
Subjective: Feeling well.  + flatus and bowel movements.  Objective: Vital signs in last 24 hours: Temp:  [98.5 F (36.9 C)-100.1 F (37.8 C)] 98.5 F (36.9 C) (02/08 0421) Pulse Rate:  [79-103] 79 (02/08 0421) Resp:  [18-21] 18 (02/08 0421) BP: (127-161)/(50-85) 127/50 (02/08 0421) SpO2:  [94 %-99 %] 99 % (02/08 0421) Last BM Date: 08/31/17  Intake/Output from previous day: 02/07 0701 - 02/08 0700 In: 3187.3 [P.O.:960; I.V.:2227.3] Out: -  Intake/Output this shift: No intake/output data recorded.  General appearance: alert and no distress GI: soft, nontender, minimal tympany  Lab Results: Recent Labs    08/30/17 0442 09/01/17 0304  WBC 5.0 4.4  HGB 10.3* 9.9*  HCT 32.2* 31.1*  PLT 198 173   BMET Recent Labs    08/30/17 0442 08/31/17 0355 09/01/17 0304  NA 135 134* 135  K 4.1 4.4 4.3  CL 101 101 103  CO2 22 22 22   GLUCOSE 181* 153* 163*  BUN 13 22* 26*  CREATININE 1.24* 1.30* 1.31*  CALCIUM 9.1 9.1 9.2   LFT Recent Labs    08/31/17 0355  PROT 6.5  ALBUMIN 3.1*  AST 12*  ALT 8*  ALKPHOS 71  BILITOT 0.6   PT/INR No results for input(s): LABPROT, INR in the last 72 hours. Hepatitis Panel No results for input(s): HEPBSAG, HCVAB, HEPAIGM, HEPBIGM in the last 72 hours. C-Diff No results for input(s): CDIFFTOX in the last 72 hours. Fecal Lactopherrin No results for input(s): FECLLACTOFRN in the last 72 hours.  Studies/Results: Dg Abd 2 Views  Result Date: 08/31/2017 CLINICAL DATA:  Colonic obstruction, stent placement EXAM: ABDOMEN - 2 VIEW; DG C-ARM 61-120 MIN COMPARISON:  None. FLUOROSCOPY TIME:  Fluoroscopy Time:  35 seconds Radiation Exposure Index (if provided by the fluoroscopic device): Not available Number of Acquired Spot Images: 8 FINDINGS: Multiple spot films were obtained during colonic stent placement in an area of anastomotic stricture as previously seen. IMPRESSION: Colonic stent placement Electronically Signed   By: Inez Catalina M.D.    On: 08/31/2017 16:27   Dg Abd Portable 1v  Result Date: 08/30/2017 CLINICAL DATA:  Small bowel obstruction. EXAM: PORTABLE ABDOMEN - 1 VIEW COMPARISON:  Radiographs of August 28, 2017. FINDINGS: Nasogastric tube is not clearly visualized on this study. Small bowel dilatation is seen in the pelvis as well as probable colonic dilatation in the right lower quadrant. IMPRESSION: Stable colonic and small bowel dilatation is noted compared to prior exam. Nasogastric tube is not seen currently. Electronically Signed   By: Marijo Conception, M.D.   On: 08/30/2017 10:06   Dg C-arm 1-60 Min  Result Date: 08/31/2017 CLINICAL DATA:  Colonic obstruction, stent placement EXAM: ABDOMEN - 2 VIEW; DG C-ARM 61-120 MIN COMPARISON:  None. FLUOROSCOPY TIME:  Fluoroscopy Time:  35 seconds Radiation Exposure Index (if provided by the fluoroscopic device): Not available Number of Acquired Spot Images: 8 FINDINGS: Multiple spot films were obtained during colonic stent placement in an area of anastomotic stricture as previously seen. IMPRESSION: Colonic stent placement Electronically Signed   By: Inez Catalina M.D.   On: 08/31/2017 16:27    Medications:  Scheduled: . febuxostat  40 mg Oral Daily  . insulin aspart  0-20 Units Subcutaneous Q4H  . levothyroxine  44 mcg Intravenous Daily  . pantoprazole (PROTONIX) IV  40 mg Intravenous QHS  . sodium chloride flush  10-40 mL Intracatheter Q12H   Continuous: . sodium chloride 10 mL/hr at 08/31/17 1130  . TPN  ADULT (ION) 100 mL/hr at 08/31/17 1837    Assessment/Plan: 1) Anastamotic stricture s/p stent placement.   She reports being up all night with bowel movements and she feels great.  Her abdomen, per her report is close to baseline.  There does not appear to be any immediate complications from the placement of the stent.  Plan: 1) Advance to full liquids.  She was able to tolerate clears yesterday. 2) Upon discharge she will follow up in the office in 1-2 weeks to plan  for stent removal.  LOS: 10 days   Yaxiel Minnie D 09/01/2017, 7:18 AM

## 2017-09-02 LAB — BASIC METABOLIC PANEL
Anion gap: 9 (ref 5–15)
BUN: 25 mg/dL — AB (ref 6–20)
CHLORIDE: 103 mmol/L (ref 101–111)
CO2: 23 mmol/L (ref 22–32)
Calcium: 9.1 mg/dL (ref 8.9–10.3)
Creatinine, Ser: 1.23 mg/dL — ABNORMAL HIGH (ref 0.44–1.00)
GFR calc Af Amer: 51 mL/min — ABNORMAL LOW (ref >60)
GFR calc non Af Amer: 44 mL/min — ABNORMAL LOW (ref >60)
Glucose, Bld: 202 mg/dL — ABNORMAL HIGH (ref 65–99)
POTASSIUM: 4.7 mmol/L (ref 3.5–5.1)
SODIUM: 135 mmol/L (ref 135–145)

## 2017-09-02 LAB — GLUCOSE, CAPILLARY
GLUCOSE-CAPILLARY: 180 mg/dL — AB (ref 65–99)
GLUCOSE-CAPILLARY: 210 mg/dL — AB (ref 65–99)
Glucose-Capillary: 114 mg/dL — ABNORMAL HIGH (ref 65–99)
Glucose-Capillary: 181 mg/dL — ABNORMAL HIGH (ref 65–99)
Glucose-Capillary: 188 mg/dL — ABNORMAL HIGH (ref 65–99)
Glucose-Capillary: 269 mg/dL — ABNORMAL HIGH (ref 65–99)

## 2017-09-02 LAB — MAGNESIUM: Magnesium: 2.2 mg/dL (ref 1.7–2.4)

## 2017-09-02 LAB — PHOSPHORUS: PHOSPHORUS: 4.4 mg/dL (ref 2.5–4.6)

## 2017-09-02 MED ORDER — TRAVASOL 10 % IV SOLN
INTRAVENOUS | Status: DC
  Administered 2017-09-02: 18:00:00 via INTRAVENOUS
  Filled 2017-09-02: qty 810

## 2017-09-02 NOTE — Progress Notes (Signed)
PHARMACY - ADULT TOTAL PARENTERAL NUTRITION CONSULT NOTE   Pharmacy Consult for TPN Indication: Bowel obstruction  Patient Measurements: Height: 5\' 5"  (165.1 cm) Weight: 216 lb 4.3 oz (98.1 kg) IBW/kg (Calculated) : 57 TPN AdjBW (KG): 67.3 Body mass index is 35.99 kg/m.    Assessment: 30 YOF who was recently discharged from the hospital for severe dehydration from gastroenteritis presented to [facility] ED with abdominal pain, bloating, nausea and vomiting. 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 CT abd on this admission was consistent with an obstruction at the anastomotic site of the recent colon-colon anastomosis with colonic dilatation and small-bowel dilatation.  GI: Planning possible surgery later this week. Albumin on admission was low at 2.7. Pre-albumin 12.8. NGT discontinued. LBM 2/8. Tolerating clear liquids (748ml), +flatus - IV PPI  Endo: IV Synthroid. Po Uloric for gout. Type II DM, A1c 8.4 in December, CBGs 162-193 on resistant SSI  Insulin requirements in the past 24 hours: 24 units SSI   Lytes: Na 134>135, K 4.7 trending up, Phos 4.4 trending up, Mg 2.2 Renal: SCr 1.23 stable , UOP not measured Pulm: on RA  Cards: VSS Hepatobil: LFTs low to normal, TG 218 ID: No issues, no abx   TPN Access: CVC 2/4>> TPN start date: 2/4>>  Nutritional Goals (per RD recommendations on 2/6): Kcal:  1750-1950 Protein:  100-115 grams Fluid:  > 1.7 L  Goal TPN rate is 100 ml/hr   Current Nutrition:  Clear liquid diet    Plan:  Decrease TPN to 75 mL/hr. --This TPN provides 81 g of protein, 216 g of dextrose, and 36 g of lipids which provides 1418 kCals per day, meeting 81% of patient needs Electrolytes in TPN: Na 75 mEq/L, decrease K, Mg, Phos slightly in the bag. Cl:Ac 1:1 Add MVI, trace elements to TPN Continue resistant insulin sliding scale- increase insulin in TPN 37 units Change IVF to NS at kvo (due to low Na,  removes dextrose) Monitor TPN labs prn Advancing diet to full liquids. Continue TPN until taking more po's per MD note   Karley Pho S. Alford Highland, PharmD, [facility] Clinical Staff Pharmacist Pager [phone]  Phone [phone]

## 2017-09-02 NOTE — Progress Notes (Signed)
2 Days Post-Op   Subjective/Chief Complaint: Feels much better. Passing flatus   Objective: Vital signs in last 24 hours: Temp:  [98.1 F (36.7 C)-98.9 F (37.2 C)] 98.1 F (36.7 C) (02/09 0357) Pulse Rate:  [76-94] 80 (02/09 0357) Resp:  [16] 16 (02/09 0357) BP: (98-141)/(47-79) 98/79 (02/09 0357) SpO2:  [98 %-100 %] 98 % (02/09 0357) Last BM Date: 09/01/17  Intake/Output from previous day: 02/08 0701 - 02/09 0700 In: 1593.3 [P.O.:600; I.V.:993.3] Out: -  Intake/Output this shift: No intake/output data recorded.  General appearance: alert and cooperative Resp: clear to auscultation bilaterally Cardio: regular rate and rhythm GI: soft, nontender. good bs  Lab Results:  Recent Labs    09/01/17 0304  WBC 4.4  HGB 9.9*  HCT 31.1*  PLT 173   BMET Recent Labs    09/01/17 0304 09/02/17 0409  NA 135 135  K 4.3 4.7  CL 103 103  CO2 22 23  GLUCOSE 163* 202*  BUN 26* 25*  CREATININE 1.31* 1.23*  CALCIUM 9.2 9.1   PT/INR No results for input(s): LABPROT, INR in the last 72 hours. ABG No results for input(s): PHART, HCO3 in the last 72 hours.  Invalid input(s): PCO2, PO2  Studies/Results: Dg Abd 2 Views  Result Date: 08/31/2017 CLINICAL DATA:  Colonic obstruction, stent placement EXAM: ABDOMEN - 2 VIEW; DG C-ARM 61-120 MIN COMPARISON:  None. FLUOROSCOPY TIME:  Fluoroscopy Time:  35 seconds Radiation Exposure Index (if provided by the fluoroscopic device): Not available Number of Acquired Spot Images: 8 FINDINGS: Multiple spot films were obtained during colonic stent placement in an area of anastomotic stricture as previously seen. IMPRESSION: Colonic stent placement Electronically Signed   By: Inez Catalina M.D.   On: 08/31/2017 16:27   Dg C-arm 1-60 Min  Result Date: 08/31/2017 CLINICAL DATA:  Colonic obstruction, stent placement EXAM: ABDOMEN - 2 VIEW; DG C-ARM 61-120 MIN COMPARISON:  None. FLUOROSCOPY TIME:  Fluoroscopy Time:  35 seconds Radiation Exposure Index  (if provided by the fluoroscopic device): Not available Number of Acquired Spot Images: 8 FINDINGS: Multiple spot films were obtained during colonic stent placement in an area of anastomotic stricture as previously seen. IMPRESSION: Colonic stent placement Electronically Signed   By: Inez Catalina M.D.   On: 08/31/2017 16:27    Anti-infectives: Anti-infectives (From admission, onward)   Start     Dose/Rate Route Frequency Ordered Stop   08/24/17 1300  vancomycin (VANCOCIN) IVPB 1000 mg/200 mL premix  Status:  Discontinued     1,000 mg 200 mL/hr over 60 Minutes Intravenous Every 48 hours 08/22/17 1250 08/22/17 2103   08/24/17 0230  piperacillin-tazobactam (ZOSYN) IVPB 3.375 g  Status:  Discontinued     3.375 g 12.5 mL/hr over 240 Minutes Intravenous Every 8 hours 08/23/17 2025 08/24/17 0819   08/22/17 1830  piperacillin-tazobactam (ZOSYN) IVPB 2.25 g  Status:  Discontinued     2.25 g 100 mL/hr over 30 Minutes Intravenous Every 8 hours 08/22/17 1250 08/23/17 2025   08/22/17 1245  piperacillin-tazobactam (ZOSYN) IVPB 3.375 g     3.375 g 100 mL/hr over 30 Minutes Intravenous  Once 08/22/17 1231 08/22/17 1305   08/22/17 1245  vancomycin (VANCOCIN) 2,000 mg in sodium chloride 0.9 % 500 mL IVPB  Status:  Discontinued     2,000 mg 250 mL/hr over 120 Minutes Intravenous  Once 08/22/17 1233 08/22/17 2103      Assessment/Plan: s/p Procedure(s) with comments: FLEXIBLE SIGMOIDOSCOPY (N/A) - stent placement COLONIC STENT PLACEMENT (  N/A) Advance diet. Start fulls today Ambulate Stent for rectal stricture Continue tpn until taking more po  LOS: 11 days    TOTH III,Charvis Lightner S 09/02/2017

## 2017-09-03 LAB — GLUCOSE, CAPILLARY
GLUCOSE-CAPILLARY: 151 mg/dL — AB (ref 65–99)
GLUCOSE-CAPILLARY: 147 mg/dL — AB (ref 65–99)
Glucose-Capillary: 143 mg/dL — ABNORMAL HIGH (ref 65–99)
Glucose-Capillary: 160 mg/dL — ABNORMAL HIGH (ref 65–99)
Glucose-Capillary: 161 mg/dL — ABNORMAL HIGH (ref 65–99)
Glucose-Capillary: 186 mg/dL — ABNORMAL HIGH (ref 65–99)

## 2017-09-03 MED ORDER — TRAVASOL 10 % IV SOLN
INTRAVENOUS | Status: AC
  Filled 2017-09-03: qty 540

## 2017-09-03 MED ORDER — INSULIN ASPART 100 UNIT/ML ~~LOC~~ SOLN
0.0000 [IU] | Freq: Three times a day (TID) | SUBCUTANEOUS | Status: DC
  Administered 2017-09-03 (×3): 4 [IU] via SUBCUTANEOUS
  Administered 2017-09-04: 3 [IU] via SUBCUTANEOUS
  Administered 2017-09-04: 4 [IU] via SUBCUTANEOUS

## 2017-09-03 MED ORDER — TRAVASOL 10 % IV SOLN
INTRAVENOUS | Status: DC
  Filled 2017-09-03: qty 81432

## 2017-09-03 MED ORDER — TRAVASOL 10 % IV SOLN
INTRAVENOUS | Status: DC
  Filled 2017-09-03: qty 540

## 2017-09-03 MED ORDER — OXYCODONE HCL 5 MG PO TABS: 2.5000 mg | ORAL_TABLET | ORAL | Status: DC | PRN

## 2017-09-03 NOTE — Progress Notes (Signed)
GS Progress Note Subjective: Patient continues to improve after colonic stent.    Objective: Vital signs in last 24 hours: Temp:  [98.2 F (36.8 C)-98.9 F (37.2 C)] 98.2 F (36.8 C) (02/10 7035) Pulse Rate:  [73-91] 73 (02/10 0608) Resp:  [16] 16 (02/10 0608) BP: (113-126)/(52-60) 126/60 (02/10 0608) SpO2:  [97 %-100 %] 100 % (02/10 0608) Last BM Date: 09/02/17  Intake/Output from previous day: 02/09 0701 - 02/10 0700 In: 2412 [P.O.:1662; I.V.:750] Out: -  Intake/Output this shift: Total I/O In: 750 [I.V.:750] Out: -   Lungs: Clear  Abd: Soft, great bowel sounds and having bowel movements.  Extremities: No changes  Neuro: Intact  Lab Results: CBC  Recent Labs    09/01/17 0304  WBC 4.4  HGB 9.9*  HCT 31.1*  PLT 173   BMET Recent Labs    09/01/17 0304 09/02/17 0409  NA 135 135  K 4.3 4.7  CL 103 103  CO2 22 23  GLUCOSE 163* 202*  BUN 26* 25*  CREATININE 1.31* 1.23*  CALCIUM 9.2 9.1   PT/INR No results for input(s): LABPROT, INR in the last 72 hours. ABG No results for input(s): PHART, HCO3 in the last 72 hours.  Invalid input(s): PCO2, PO2  Studies/Results: No results found.  Anti-infectives: Anti-infectives (From admission, onward)   Start     Dose/Rate Route Frequency Ordered Stop   08/24/17 1300  vancomycin (VANCOCIN) IVPB 1000 mg/200 mL premix  Status:  Discontinued     1,000 mg 200 mL/hr over 60 Minutes Intravenous Every 48 hours 08/22/17 1250 08/22/17 2103   08/24/17 0230  piperacillin-tazobactam (ZOSYN) IVPB 3.375 g  Status:  Discontinued     3.375 g 12.5 mL/hr over 240 Minutes Intravenous Every 8 hours 08/23/17 2025 08/24/17 0819   08/22/17 1830  piperacillin-tazobactam (ZOSYN) IVPB 2.25 g  Status:  Discontinued     2.25 g 100 mL/hr over 30 Minutes Intravenous Every 8 hours 08/22/17 1250 08/23/17 2025   08/22/17 1245  piperacillin-tazobactam (ZOSYN) IVPB 3.375 g     3.375 g 100 mL/hr over 30 Minutes Intravenous  Once 08/22/17  1231 08/22/17 1305   08/22/17 1245  vancomycin (VANCOCIN) 2,000 mg in sodium chloride 0.9 % 500 mL IVPB  Status:  Discontinued     2,000 mg 250 mL/hr over 120 Minutes Intravenous  Once 08/22/17 1233 08/22/17 2103      Assessment/Plan: s/p Procedure(s): FLEXIBLE SIGMOIDOSCOPY COLONIC STENT PLACEMENT Advance diet Plan for discharge tomorrow  Stop TPN today  LOS: 12 days    Kathryne Eriksson. [name], MD, FACS [phone] [facility] Surgery 09/03/2017

## 2017-09-04 LAB — GLUCOSE, CAPILLARY
GLUCOSE-CAPILLARY: 165 mg/dL — AB (ref 65–99)
Glucose-Capillary: 172 mg/dL — ABNORMAL HIGH (ref 65–99)
Glucose-Capillary: 126 mg/dL — ABNORMAL HIGH (ref 65–99)

## 2017-09-04 MED ORDER — PANTOPRAZOLE SODIUM 40 MG IV SOLR: 40.0000 mg | Freq: Every day | INTRAVENOUS | 30 refills | Status: DC

## 2017-09-04 MED ORDER — LEVOTHYROXINE SODIUM 88 MCG PO TABS
88.0000 ug | ORAL_TABLET | Freq: Every day | ORAL | Status: DC
  Administered 2017-09-04: 88 ug via ORAL
  Filled 2017-09-04: qty 1

## 2017-09-04 NOTE — Care Management Important Message (Signed)
Important Message  Patient Details  Name: Crystal [name] MRN: [id number] Date of Birth: 1951/06/26   Medicare Important Message Given:  Yes    Orbie Pyo 09/04/2017, 2:12 PM

## 2017-09-04 NOTE — Discharge Instructions (Signed)
Stay on a soft diet until seen in clinic.  Keep appointments as arranged.  If you have further problems with bloating and constipation, call Dr. Richarda Blade office at [phone].  Crystal [name]. [name], MD, [place] [phone] [facility] Surgery

## 2017-09-04 NOTE — Discharge Summary (Signed)
Physician Discharge Summary  Patient ID: ITHA KROEKER MRN: [id number] DOB/AGE: 68-Oct-1952 68 y.o.  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  This resolved with TPN, bowel rest and anastomotic dilatation by GI Dr. Benson Norway.  Consults: GI  Significant Diagnostic Studies: labs: cbc/bmet, radiology: Water soluble enema and endoscopy: colonoscopy: with anastomotic dilatation endoscopically  Treatments: IV hydration, antibiotics: Zosyn and analgesia: acetaminophen and Morphine  Discharge Exam: Blood pressure (!) 120/59, pulse 87, temperature 98.7 F (37.1 C), temperature source Oral, resp. rate 16, height 5\' 5"  (1.651 m), weight 98.1 kg (216 lb 4.3 oz), SpO2 97 %. General appearance: alert, cooperative and no distress Chest wall: no tenderness GI: soft, non-tender; bowel sounds normal; no masses,  no organomegaly and having bowel movements and excellent bowel sounds  Disposition: 01-Home or Self Care   Allergies as of 09/04/2017      Reactions   Glucophage [metformin Hcl] Other (See Comments)   Renal failure   Ace Inhibitors Swelling, Other (See Comments)   Angioedema   Advicor [niacin-lovastatin Er] Other (See Comments)   Muscle aches   Bystolic [nebivolol Hcl] Other (See Comments)   Edema   Erythromycin Diarrhea, Nausea And Vomiting, Other (See Comments)   *DERIVATIVES*   Lipitor [atorvastatin] Other (See Comments)   Muscle aches   Lopid [gemfibrozil] Other (See Comments)   Muscle aches   Statins Other (See Comments)   Muscle aches   Adhesive [tape] Rash      Medication List    TAKE these medications   acetaminophen 500 MG tablet Commonly known as:  TYLENOL Take 1,000 mg by mouth every 6 (six) hours as needed for  moderate pain or headache.   carvedilol 12.5 MG tablet Commonly known as:  COREG Take 12.5 mg by mouth 2 (two) times daily with a meal.   CRESTOR 10 MG tablet Generic drug:  rosuvastatin Take 1 tablet (10 mg total) by mouth at bedtime.   escitalopram 10 MG tablet Commonly known as:  LEXAPRO Take 10 mg by mouth at bedtime.   ferrous sulfate 325 (65 FE) MG EC tablet Take 325 mg by mouth daily.   Fish Oil 1000 MG Caps Take 1,000 capsules by mouth daily.   fluticasone 50 MCG/ACT nasal spray Commonly known as:  FLONASE SHAKE LIQUID AND USE 1 SPRAY IN EACH NOSTRIL TWICE DAILY   gabapentin 100 MG capsule Commonly known as:  NEURONTIN Take 100-800 mg by mouth See admin instructions. Take 100 mg by mouth in the morning and take 800 mg by mouth at bedtime   Insulin Glargine 300 UNIT/ML Sopn Commonly known as:  TOUJEO SOLOSTAR Inject 50 Units into the skin 2 (two) times daily.   Insulin Glulisine 100 UNIT/ML Solostar Pen Commonly known as:  APIDRA Inject 20 Units into the skin 3 (three) times daily.   levothyroxine 88 MCG tablet Commonly known as:  SYNTHROID, LEVOTHROID Take 88 mcg by mouth daily.   loratadine 10 MG tablet Commonly known as:  CLARITIN Take 10 mg by mouth daily.   Misc. Devices Misc by Does not apply route. C-pap   montelukast 10 MG tablet Commonly known as:  SINGULAIR Take 10 mg by mouth at bedtime.   omega-3 acid ethyl esters 1 g capsule Commonly known as:  LOVAZA Take 4 g  by mouth at bedtime.   ondansetron 4 MG disintegrating tablet Commonly known as:  ZOFRAN-ODT Take 4 mg by mouth every 6 (six) hours as needed.   OXYGEN Inhale 2 L into the lungs.   pantoprazole 40 MG injection Commonly known as:  PROTONIX Inject 40 mg into the vein at bedtime.   promethazine 25 MG tablet Commonly known as:  PHENERGAN Take 25 mg by mouth every 6 (six) hours as needed for nausea or vomiting.   temazepam 15 MG capsule Commonly known as:  RESTORIL Take 1  capsule (15 mg total) by mouth at bedtime as needed for sleep.   traMADol 50 MG tablet Commonly known as:  ULTRAM Take 2 tablets (100 mg total) by mouth 2 (two) times daily.   ULORIC 40 MG tablet Generic drug:  febuxostat Take 40 mg by mouth daily.      Follow-up Information    Judeth Horn, MD. Go on 09/19/2017.   Specialty:  General Surgery Why:  Appointment is at 10:30 AM Contact information: [address] [place] [address] [phone]           Signed: Judeth Horn 09/04/2017, 12:01 PM

## 2017-09-06 ENCOUNTER — Telehealth: Payer: Self-pay

## 2017-09-06 NOTE — Telephone Encounter (Signed)
Transition Care Management Follow-Up Telephone Call   Date discharged and where: [facility] on 09/04/2017  How have you been since you were released from the hospital? Feeling a lot better, has colonic stent in until Monday to see if it helps her with her bowel movements  Any patient concerns? None  Items Reviewed:   Meds: Y  Allergies: Y  Dietary Changes Reviewed: Y  Functional Questionnaire:  Independent-I Dependent-D  ADLs:   Dressing- I    Eating- I   Maintaining continence- I   Transferring- I   Transportation- D   Meal Prep- I   Managing Meds- I  Confirmed importance and Date/Time of follow-up visits scheduled: Pt did not want to make f/u appointment with Dr. Eulas Post at this time. She said she would call back to make it   Confirmed with patient if condition worsens to call PCP or go to the Emergency Dept. Patient was given office number and encouraged to call back with questions or concerns: Yes

## 2017-09-11 ENCOUNTER — Telehealth: Payer: Self-pay | Admitting: Internal Medicine

## 2017-09-11 NOTE — Telephone Encounter (Signed)
Left message asking pt to call me at [phone] to schedule AWV-I and follow up appt w/ Dr. Carter. VDM (DD) °

## 2017-09-13 ENCOUNTER — Other Ambulatory Visit: Payer: Self-pay | Admitting: Gastroenterology

## 2017-09-13 ENCOUNTER — Ambulatory Visit
Admission: RE | Admit: 2017-09-13 | Discharge: 2017-09-13 | Disposition: A | Discharge status: Home / Self Care | Payer: Medicare Other | Source: Ambulatory Visit | Attending: Gastroenterology | Admitting: Gastroenterology

## 2017-09-13 DIAGNOSIS — K56699 Other intestinal obstruction unspecified as to partial versus complete obstruction: Secondary | ICD-10-CM

## 2017-09-13 DIAGNOSIS — R933 Abnormal findings on diagnostic imaging of other parts of digestive tract: Secondary | ICD-10-CM | POA: Diagnosis not present

## 2017-09-13 DIAGNOSIS — K5900 Constipation, unspecified: Secondary | ICD-10-CM | POA: Diagnosis not present

## 2017-09-15 ENCOUNTER — Inpatient Hospital Stay (HOSPITAL_COMMUNITY)
Admission: EM | Admit: 2017-09-15 | Discharge: 2017-09-26 | DRG: 330 | Disposition: A | Discharge status: Home / Self Care | Payer: Medicare Other | Attending: General Surgery | Admitting: General Surgery

## 2017-09-15 ENCOUNTER — Emergency Department (HOSPITAL_COMMUNITY): Payer: Medicare Other

## 2017-09-15 ENCOUNTER — Other Ambulatory Visit: Payer: Self-pay

## 2017-09-15 DIAGNOSIS — J45909 Unspecified asthma, uncomplicated: Secondary | ICD-10-CM | POA: Diagnosis present

## 2017-09-15 DIAGNOSIS — G4733 Obstructive sleep apnea (adult) (pediatric): Secondary | ICD-10-CM | POA: Diagnosis present

## 2017-09-15 DIAGNOSIS — N183 Chronic kidney disease, stage 3 (moderate): Secondary | ICD-10-CM | POA: Diagnosis present

## 2017-09-15 DIAGNOSIS — E669 Obesity, unspecified: Secondary | ICD-10-CM | POA: Diagnosis present

## 2017-09-15 DIAGNOSIS — N390 Urinary tract infection, site not specified: Secondary | ICD-10-CM | POA: Diagnosis not present

## 2017-09-15 DIAGNOSIS — E1122 Type 2 diabetes mellitus with diabetic chronic kidney disease: Secondary | ICD-10-CM | POA: Diagnosis present

## 2017-09-15 DIAGNOSIS — Y832 Surgical operation with anastomosis, bypass or graft as the cause of abnormal reaction of the patient, or of later complication, without mention of misadventure at the time of the procedure: Secondary | ICD-10-CM | POA: Diagnosis present

## 2017-09-15 DIAGNOSIS — Z452 Encounter for adjustment and management of vascular access device: Secondary | ICD-10-CM

## 2017-09-15 DIAGNOSIS — N179 Acute kidney failure, unspecified: Secondary | ICD-10-CM | POA: Diagnosis not present

## 2017-09-15 DIAGNOSIS — I130 Hypertensive heart and chronic kidney disease with heart failure and stage 1 through stage 4 chronic kidney disease, or unspecified chronic kidney disease: Secondary | ICD-10-CM | POA: Diagnosis present

## 2017-09-15 DIAGNOSIS — R112 Nausea with vomiting, unspecified: Secondary | ICD-10-CM | POA: Diagnosis not present

## 2017-09-15 DIAGNOSIS — D62 Acute posthemorrhagic anemia: Secondary | ICD-10-CM | POA: Diagnosis not present

## 2017-09-15 DIAGNOSIS — K2970 Gastritis, unspecified, without bleeding: Secondary | ICD-10-CM | POA: Diagnosis not present

## 2017-09-15 DIAGNOSIS — E875 Hyperkalemia: Secondary | ICD-10-CM | POA: Diagnosis not present

## 2017-09-15 DIAGNOSIS — I255 Ischemic cardiomyopathy: Secondary | ICD-10-CM | POA: Diagnosis present

## 2017-09-15 DIAGNOSIS — E039 Hypothyroidism, unspecified: Secondary | ICD-10-CM | POA: Diagnosis present

## 2017-09-15 DIAGNOSIS — F419 Anxiety disorder, unspecified: Secondary | ICD-10-CM | POA: Diagnosis present

## 2017-09-15 DIAGNOSIS — G8929 Other chronic pain: Secondary | ICD-10-CM | POA: Diagnosis present

## 2017-09-15 DIAGNOSIS — I2510 Atherosclerotic heart disease of native coronary artery without angina pectoris: Secondary | ICD-10-CM | POA: Diagnosis present

## 2017-09-15 DIAGNOSIS — Z90710 Acquired absence of both cervix and uterus: Secondary | ICD-10-CM

## 2017-09-15 DIAGNOSIS — K565 Intestinal adhesions [bands], unspecified as to partial versus complete obstruction: Secondary | ICD-10-CM | POA: Diagnosis present

## 2017-09-15 DIAGNOSIS — R109 Unspecified abdominal pain: Secondary | ICD-10-CM | POA: Diagnosis not present

## 2017-09-15 DIAGNOSIS — Z9981 Dependence on supplemental oxygen: Secondary | ICD-10-CM

## 2017-09-15 DIAGNOSIS — B952 Enterococcus as the cause of diseases classified elsewhere: Secondary | ICD-10-CM | POA: Diagnosis not present

## 2017-09-15 DIAGNOSIS — E785 Hyperlipidemia, unspecified: Secondary | ICD-10-CM | POA: Diagnosis present

## 2017-09-15 DIAGNOSIS — Z6832 Body mass index (BMI) 32.0-32.9, adult: Secondary | ICD-10-CM

## 2017-09-15 DIAGNOSIS — E119 Type 2 diabetes mellitus without complications: Secondary | ICD-10-CM

## 2017-09-15 DIAGNOSIS — K56609 Unspecified intestinal obstruction, unspecified as to partial versus complete obstruction: Secondary | ICD-10-CM | POA: Diagnosis not present

## 2017-09-15 DIAGNOSIS — I252 Old myocardial infarction: Secondary | ICD-10-CM

## 2017-09-15 DIAGNOSIS — Z794 Long term (current) use of insulin: Secondary | ICD-10-CM

## 2017-09-15 DIAGNOSIS — Z4659 Encounter for fitting and adjustment of other gastrointestinal appliance and device: Secondary | ICD-10-CM

## 2017-09-15 DIAGNOSIS — Z9841 Cataract extraction status, right eye: Secondary | ICD-10-CM

## 2017-09-15 DIAGNOSIS — Z9842 Cataract extraction status, left eye: Secondary | ICD-10-CM

## 2017-09-15 DIAGNOSIS — F329 Major depressive disorder, single episode, unspecified: Secondary | ICD-10-CM | POA: Diagnosis present

## 2017-09-15 DIAGNOSIS — K913 Postprocedural intestinal obstruction, unspecified as to partial versus complete: Secondary | ICD-10-CM | POA: Diagnosis not present

## 2017-09-15 DIAGNOSIS — Z79899 Other long term (current) drug therapy: Secondary | ICD-10-CM

## 2017-09-15 DIAGNOSIS — Z4682 Encounter for fitting and adjustment of non-vascular catheter: Secondary | ICD-10-CM | POA: Diagnosis not present

## 2017-09-15 DIAGNOSIS — G4700 Insomnia, unspecified: Secondary | ICD-10-CM | POA: Diagnosis present

## 2017-09-15 DIAGNOSIS — E1142 Type 2 diabetes mellitus with diabetic polyneuropathy: Secondary | ICD-10-CM | POA: Diagnosis present

## 2017-09-15 DIAGNOSIS — I509 Heart failure, unspecified: Secondary | ICD-10-CM | POA: Diagnosis present

## 2017-09-15 DIAGNOSIS — R140 Abdominal distension (gaseous): Secondary | ICD-10-CM | POA: Diagnosis not present

## 2017-09-15 DIAGNOSIS — K219 Gastro-esophageal reflux disease without esophagitis: Secondary | ICD-10-CM | POA: Diagnosis present

## 2017-09-15 DIAGNOSIS — M109 Gout, unspecified: Secondary | ICD-10-CM | POA: Diagnosis present

## 2017-09-15 DIAGNOSIS — Z95810 Presence of automatic (implantable) cardiac defibrillator: Secondary | ICD-10-CM

## 2017-09-15 DIAGNOSIS — Z961 Presence of intraocular lens: Secondary | ICD-10-CM | POA: Diagnosis present

## 2017-09-15 LAB — COMPREHENSIVE METABOLIC PANEL
ALBUMIN: 4.2 g/dL (ref 3.5–5.0)
ALK PHOS: 104 U/L (ref 38–126)
ALT: 16 U/L (ref 14–54)
AST: 19 U/L (ref 15–41)
Anion gap: 15 (ref 5–15)
BILIRUBIN TOTAL: 1.1 mg/dL (ref 0.3–1.2)
BUN: 37 mg/dL — AB (ref 6–20)
CO2: 25 mmol/L (ref 22–32)
CREATININE: 2.68 mg/dL — AB (ref 0.44–1.00)
Calcium: 10.2 mg/dL (ref 8.9–10.3)
Chloride: 94 mmol/L — ABNORMAL LOW (ref 101–111)
GFR calc Af Amer: 20 mL/min — ABNORMAL LOW (ref >60)
GFR calc non Af Amer: 17 mL/min — ABNORMAL LOW (ref >60)
GLUCOSE: 229 mg/dL — AB (ref 65–99)
POTASSIUM: 6.5 mmol/L — AB (ref 3.5–5.1)
Sodium: 134 mmol/L — ABNORMAL LOW (ref 135–145)
Total Protein: 8.5 g/dL — ABNORMAL HIGH (ref 6.5–8.1)

## 2017-09-15 LAB — CBC WITH DIFFERENTIAL/PLATELET
BASOS PCT: 0 %
Basophils Absolute: 0 10*3/uL (ref 0.0–0.1)
EOS PCT: 1 %
Eosinophils Absolute: 0.1 10*3/uL (ref 0.0–0.7)
HEMATOCRIT: 41.6 % (ref 36.0–46.0)
Hemoglobin: 13.6 g/dL (ref 12.0–15.0)
LYMPHS PCT: 14 %
Lymphs Abs: 1.8 10*3/uL (ref 0.7–4.0)
MCH: 27.1 pg (ref 26.0–34.0)
MCHC: 32.7 g/dL (ref 30.0–36.0)
MCV: 82.9 fL (ref 78.0–100.0)
MONO ABS: 0.8 10*3/uL (ref 0.1–1.0)
MONOS PCT: 6 %
NEUTROS ABS: 10.3 10*3/uL — AB (ref 1.7–7.7)
Neutrophils Relative %: 79 %
PLATELETS: 345 10*3/uL (ref 150–400)
RBC: 5.02 MIL/uL (ref 3.87–5.11)
RDW: 15.2 % (ref 11.5–15.5)
WBC: 13.1 10*3/uL — ABNORMAL HIGH (ref 4.0–10.5)

## 2017-09-15 LAB — BASIC METABOLIC PANEL
Anion gap: 14 (ref 5–15)
BUN: 40 mg/dL — AB (ref 6–20)
CHLORIDE: 96 mmol/L — AB (ref 101–111)
CO2: 26 mmol/L (ref 22–32)
Calcium: 9.8 mg/dL (ref 8.9–10.3)
Creatinine, Ser: 2.8 mg/dL — ABNORMAL HIGH (ref 0.44–1.00)
GFR calc Af Amer: 19 mL/min — ABNORMAL LOW (ref >60)
GFR, EST NON AFRICAN AMERICAN: 16 mL/min — AB (ref >60)
GLUCOSE: 124 mg/dL — AB (ref 65–99)
POTASSIUM: 5.4 mmol/L — AB (ref 3.5–5.1)
Sodium: 136 mmol/L (ref 135–145)

## 2017-09-15 LAB — LIPASE, BLOOD: Lipase: 35 U/L (ref 11–51)

## 2017-09-15 LAB — I-STAT CG4 LACTIC ACID, ED: Lactic Acid, Venous: 1.6 mmol/L (ref 0.5–1.9)

## 2017-09-15 MED ORDER — DIPHENHYDRAMINE HCL 50 MG/ML IJ SOLN: 12.5000 mg | Freq: Four times a day (QID) | INTRAMUSCULAR | Status: DC | PRN

## 2017-09-15 MED ORDER — DIATRIZOATE MEGLUMINE & SODIUM 66-10 % PO SOLN
90.0000 mL | Freq: Once | ORAL | Status: DC
  Filled 2017-09-15: qty 90

## 2017-09-15 MED ORDER — SODIUM CHLORIDE 0.9 % IV SOLN
1.0000 g | Freq: Once | INTRAVENOUS | Status: AC
  Administered 2017-09-15: 1 g via INTRAVENOUS
  Filled 2017-09-15: qty 10

## 2017-09-15 MED ORDER — MORPHINE SULFATE (PF) 4 MG/ML IV SOLN: 1.0000 mg | INTRAVENOUS | Status: DC | PRN

## 2017-09-15 MED ORDER — DIPHENHYDRAMINE HCL 12.5 MG/5ML PO ELIX: 12.5000 mg | ORAL_SOLUTION | Freq: Four times a day (QID) | ORAL | Status: DC | PRN

## 2017-09-15 MED ORDER — INSULIN ASPART 100 UNIT/ML ~~LOC~~ SOLN
0.0000 [IU] | Freq: Three times a day (TID) | SUBCUTANEOUS | Status: DC
  Administered 2017-09-18 (×2): 3 [IU] via SUBCUTANEOUS
  Administered 2017-09-19 (×2): 11 [IU] via SUBCUTANEOUS
  Administered 2017-09-19: 3 [IU] via SUBCUTANEOUS
  Administered 2017-09-20: 4 [IU] via SUBCUTANEOUS
  Administered 2017-09-20: 3 [IU] via SUBCUTANEOUS
  Administered 2017-09-20: 4 [IU] via SUBCUTANEOUS
  Administered 2017-09-21 – 2017-09-22 (×5): 3 [IU] via SUBCUTANEOUS
  Administered 2017-09-22: 4 [IU] via SUBCUTANEOUS
  Administered 2017-09-23: 3 [IU] via SUBCUTANEOUS
  Administered 2017-09-23 – 2017-09-24 (×3): 4 [IU] via SUBCUTANEOUS
  Administered 2017-09-25 (×3): 3 [IU] via SUBCUTANEOUS
  Administered 2017-09-26: 7 [IU] via SUBCUTANEOUS
  Administered 2017-09-26: 3 [IU] via SUBCUTANEOUS

## 2017-09-15 MED ORDER — PANTOPRAZOLE SODIUM 40 MG IV SOLR
40.0000 mg | Freq: Every day | INTRAVENOUS | Status: DC
  Administered 2017-09-15 – 2017-09-23 (×9): 40 mg via INTRAVENOUS
  Filled 2017-09-15 (×9): qty 40

## 2017-09-15 MED ORDER — ONDANSETRON 4 MG PO TBDP
4.0000 mg | ORAL_TABLET | Freq: Once | ORAL | Status: AC | PRN
  Administered 2017-09-15: 4 mg via ORAL
  Filled 2017-09-15: qty 1

## 2017-09-15 MED ORDER — INSULIN ASPART 100 UNIT/ML IV SOLN
10.0000 [IU] | Freq: Once | INTRAVENOUS | Status: AC
  Administered 2017-09-15: 10 [IU] via INTRAVENOUS
  Filled 2017-09-15: qty 0.1

## 2017-09-15 MED ORDER — SODIUM CHLORIDE 0.9 % IV SOLN: 1.0000 g | Freq: Once | INTRAVENOUS | Status: DC

## 2017-09-15 MED ORDER — SODIUM CHLORIDE 0.45 % IV SOLN
INTRAVENOUS | Status: DC
  Administered 2017-09-15 – 2017-09-16 (×3): via INTRAVENOUS

## 2017-09-15 MED ORDER — ONDANSETRON 4 MG PO TBDP: 4.0000 mg | ORAL_TABLET | Freq: Four times a day (QID) | ORAL | Status: DC | PRN

## 2017-09-15 MED ORDER — ONDANSETRON HCL 4 MG/2ML IJ SOLN
4.0000 mg | Freq: Four times a day (QID) | INTRAMUSCULAR | Status: DC | PRN
  Administered 2017-09-15 – 2017-09-18 (×2): 4 mg via INTRAVENOUS
  Filled 2017-09-15: qty 2

## 2017-09-15 MED ORDER — HEPARIN SODIUM (PORCINE) 5000 UNIT/ML IJ SOLN
5000.0000 [IU] | Freq: Three times a day (TID) | INTRAMUSCULAR | Status: AC
  Administered 2017-09-15 – 2017-09-17 (×7): 5000 [IU] via SUBCUTANEOUS
  Filled 2017-09-15 (×7): qty 1

## 2017-09-15 MED ORDER — HYDRALAZINE HCL 20 MG/ML IJ SOLN: 10.0000 mg | INTRAMUSCULAR | Status: DC | PRN

## 2017-09-15 MED ORDER — SODIUM CHLORIDE 0.9 % IV BOLUS (SEPSIS)
1000.0000 mL | Freq: Once | INTRAVENOUS | Status: AC
  Administered 2017-09-15: 1000 mL via INTRAVENOUS

## 2017-09-15 MED ORDER — LEVOTHYROXINE SODIUM 100 MCG IV SOLR
50.0000 ug | Freq: Every day | INTRAVENOUS | Status: DC
  Administered 2017-09-15 – 2017-09-24 (×10): 50 ug via INTRAVENOUS
  Filled 2017-09-15 (×11): qty 5

## 2017-09-15 NOTE — ED Notes (Signed)
notoifed pharmacy for need of calcium

## 2017-09-15 NOTE — ED Provider Notes (Signed)
Patient placed in Quick Look pathway, seen and evaluated   Chief Complaint: Nausea, vomiting, abdominal pain and distention  HPI:   Patient reports that at the end of December she had a colostomy reversal.  Patient states that since then she has had continued abdominal pain, difficulties with bowel movement and nausea.  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  This resolved with anastomotic dilation, bowel rest and TPN.  She is followed by Dr. Hulen Skains with general surgery and Dr. Almyra Free with GI.  Patient states that she started having severe vomiting and nausea yesterday.  Reports abdominal distention that has been worse over the past 3 days.  States that she does have a stent in her intestines that they need to remove however they have not been able to find it.  Patient had x-rays done by Dr. Almyra Free 3 days ago which showed no obstruction at that time.  Patient reports that Dr. Hulen Skains is aware patient will need them in the ED.  Denies any associated fevers, urinary symptoms, bloody stools.  Denies passing gas.  Last bowel movement was 1 week ago.  Reports 6-7 episodes of bilious emesis.  ROS: Abdominal pain, nausea, emesis, abdominal distention (one)  Physical Exam:   Gen: No distress  Neuro: Awake and Alert  Skin: Warm    Focused Exam: Abdomen is distended.  Midline surgical incision appears to be healing well without any signs of erythema.  Decreased bowel sounds.  Tender to palpation of the entire abdomen without any signs of peritonitis.   Initiation of care has begun. The patient has been counseled on the process, plan, and necessity for staying for the completion/evaluation, and the remainder of the medical screening examination  Informed charge nurse that Dr. Hulen Skains was aware of patient and that she may need a room.  Blood work and x-ray was obtained to rule out any obstruction.   Discussed with the patient that exiting the  department prior to completion of the work-up is AMA and there is no guarantee that there are no emergency medical conditions present.     Doristine Devoid, PA-C 09/15/17 1520    Tegeler, Gwenyth Allegra, MD 09/16/17 [phone]

## 2017-09-15 NOTE — ED Notes (Signed)
Pt's family members expressed pt was waiting for Dr Hulen Skains. Family concerns passed on to ED-Pa

## 2017-09-15 NOTE — ED Provider Notes (Signed)
[facility]  SURGICAL Provider Note   CSN: [id number] Arrival date & time: 09/15/17  1438     History   Chief Complaint Chief Complaint  Patient presents with  . Nausea    HPI Crystal [name] is a 67 y.o. 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  Denies flatus, fever, urinary symptoms, hematochezia, melena, or hematemesis. Last BM 1 week ago.   Last admission from 01/29-2/11 for colonic anastomotic stricture leading to colonic obstruction that resolved with anastomotic dilation, bowel rest, and TPN.  She is followed by Dr. Hulen Skains with general surgery and Dr. Benson Norway with GI.  She had an endoscopic stent placed on 2/7.  She states that she needs to have the stent removed however, they have not been able to find it.  X-rays performed 3 days ago by Dr. Benson Norway showed no obstruction.   The history is provided by the patient and a relative. No language interpreter was used.    Past Medical History:  Diagnosis Date  . AICD (automatic cardioverter/defibrillator) present    high RV threshold chronically, device was turned off in 2014; Device battery has been dead x 7 years  . Anemia, iron deficiency   . Angioedema    felt to likely be due to ace inhibitors but says she has had this even off of medicines, appears to be tolerating ARBs chronically  . Anxiety   . Arthritis    "hands, legs, arms" (07/13/2017)  . Asthmatic bronchitis with status asthmaticus   . Bradycardia   . CHF (congestive heart failure) ([place])   . Chronic lower back pain   . Chronic pain   . Chronic renal insufficiency   . CKD (chronic kidney disease), stage III ([place])   . Coronary artery disease    Mild nonobstructive (30%  LAD, 30% RCA) by 09/01/16 cath at [facility]  . Degenerative joint disease   . Depression   . Diabetes mellitus, type 2 ([place])   . Diabetic peripheral neuropathy ([place])   . Dizziness   . Dyspnea on exertion   . Family history of adverse reaction to anesthesia    Mother has nausea  . GERD (gastroesophageal reflux disease)   . Gout   . Heart valve disorder   . History of blood transfusion 1981; ~ 2005; 12/2016   "childbirth; defibrillator OR; colostomy OR"  . History of mononucleosis 03/2014  . Hyperlipidemia   . Hypertension   . Hypothyroid   . Insomnia   . Intervertebral disc degeneration   . Ischemic cardiomyopathy   . LBBB (left bundle branch block)   . Myocardial infarction ([place]) dx'd ~ 2005  . Nonischemic cardiomyopathy ([place])    s/p ICD in 2005. EF has since recovered  . Obesity   . On home oxygen therapy    "2L at night" (07/13/2017)  . OSA on CPAP   . Pneumonia    "couple times; last time was 12/2016" (07/13/2017)  . PONV (postoperative nausea and vomiting)   . Swelling   . Syncope   . Systemic hypertension   . Vitamin D deficiency     Patient Active Problem List   Diagnosis Date Noted  . Bowel obstruction ([place]) 08/23/2017  . Colonic  obstruction ([place]) 08/22/2017  . Dehydration   . Intractable vomiting with nausea   . Hyperkalemia   . Gastroenteritis 08/11/2017  . Hypotension 08/11/2017  . AKI (acute kidney injury) ([place]) 08/11/2017  . Hypovolemia 08/10/2017  . 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 Adjustment disorder with depressed mood   . Debilitated 02/06/2017  . S/P colostomy ([place]) 01/31/2017  . Pressure injury of skin 01/28/2017  . Colitis 01/26/2017  . Acute MI ([place]) 05/05/2014  . Anemia, iron deficiency 05/05/2014  . Airway hyperreactivity 05/05/2014  . Diabetes mellitus, type 2 ([place]) 05/05/2014  . Chronic systolic heart failure ([place]) 05/05/2014  . HLD (hyperlipidemia) 05/05/2014  . Disease of thyroid gland 05/05/2014  . Nonischemic  cardiomyopathy ([place]) 02/02/2014  . LBBB (left bundle branch block) 02/02/2014  . Chronic systolic dysfunction of left ventricle 02/02/2014  . Chronic renal insufficiency 02/02/2014  . Essential hypertension 02/02/2014  . Morbid obesity ([place]) 02/02/2014  . History of prolonged Q-T interval on ECG 10/31/2012  . Automatic implantable cardioverter-defibrillator in situ 08/06/2008    Past Surgical History:  Procedure Laterality Date  . APPENDECTOMY  07/13/2017  . BLADDER SUSPENSION  1990   "w/hysterectomy"  . CARDIAC CATHETERIZATION  2005   no obstructive CAD per patient  . CARDIAC CATHETERIZATION  2018  . CARDIAC DEFIBRILLATOR PLACEMENT  2005   BiV ICD implanted,  LV lead is an epicardial lead  . CARPAL TUNNEL RELEASE Left 07/25/2014   Dr.Williamson   . CATARACT EXTRACTION W/ INTRAOCULAR LENS  IMPLANT, BILATERAL Bilateral   . COLONIC STENT PLACEMENT N/A 08/31/2017   Procedure: COLONIC STENT PLACEMENT;  Surgeon: Carol Ada, MD;  Location: [place];  Service: Endoscopy;  Laterality: N/A;  . COLONOSCOPY     2010-2011 Dr.Kipreos   . COLOSTOMY  12/2016   Archie Endo 01/26/2017  . COLOSTOMY REVERSAL  07/13/2017  . COLOSTOMY REVERSAL N/A 07/13/2017   Procedure: COLOSTOMY REVERSAL;  Surgeon: Judeth Horn, MD;  Location: [place];  Service: General;  Laterality: N/A;  . CYST REMOVAL HAND Right 05/2013   thumb  . DILATION AND CURETTAGE OF UTERUS  X 5-6  . FLEXIBLE SIGMOIDOSCOPY N/A 08/24/2017   Procedure: FLEXIBLE SIGMOIDOSCOPY;  Surgeon: Carol Ada, MD;  Location: [place];  Service: Endoscopy;  Laterality: N/A;  . FLEXIBLE SIGMOIDOSCOPY N/A 08/31/2017   Procedure: FLEXIBLE SIGMOIDOSCOPY;  Surgeon: Carol Ada, MD;  Location: [place];  Service: Endoscopy;  Laterality: N/A;  stent placement  . IMPLANTABLE CARDIOVERTER DEFIBRILLATOR GENERATOR CHANGE  2008  . TOTAL ABDOMINAL HYSTERECTOMY  1990  . TRIGGER FINGER RELEASE Right 05/213  . TUBAL LIGATION  1980s  . VESICOVAGINAL FISTULA  CLOSURE W/ TAH      OB History    No data available       Home Medications    Prior to Admission medications   Medication Sig Start Date End Date Taking? Authorizing Provider  acetaminophen (TYLENOL) 500 MG tablet Take 1,000 mg by mouth every 6 (six) hours as needed for moderate pain or headache.   Yes [provider]  carvedilol (COREG) 12.5 MG tablet Take 12.5 mg by mouth 2 (two) times daily with a meal.   Yes [provider]  CRESTOR 10 MG tablet Take 1 tablet (10 mg total) by mouth at bedtime. 02/15/17  Yes Love, Ivan Anchors, PA-C  escitalopram (LEXAPRO) 10 MG tablet Take 10 mg by mouth at bedtime.   Yes [provider]  ferrous sulfate 325 (65 FE) MG EC tablet Take 325 mg by mouth  daily.    Yes [provider]  gabapentin (NEURONTIN) 100 MG capsule Take 100-800 mg by mouth See admin instructions. Take 100 mg by mouth in the morning and take 800 mg by mouth at bedtime   Yes [provider]  Insulin Glargine (TOUJEO SOLOSTAR) 300 UNIT/ML SOPN Inject 50 Units into the skin 2 (two) times daily. Patient taking differently: Inject 100 Units into the skin daily.  07/17/17  Yes Kinsinger, Arta Bruce, MD  Insulin Glulisine (APIDRA) 100 UNIT/ML Solostar Pen Inject 20 Units into the skin 3 (three) times daily.   Yes [provider]  levothyroxine (SYNTHROID, LEVOTHROID) 88 MCG tablet Take 88 mcg by mouth daily.  10/16/15  Yes [provider]  loratadine (CLARITIN) 10 MG tablet Take 10 mg by mouth daily.   Yes [provider]  Misc. Devices MISC by Does not apply route. C-pap   Yes [provider]  montelukast (SINGULAIR) 10 MG tablet Take 10 mg by mouth at bedtime.   Yes [provider]  omega-3 acid ethyl esters (LOVAZA) 1 g capsule Take 4 g by mouth at bedtime.    Yes [provider]  OXYGEN Inhale 2 L into the lungs.   Yes [provider]  telmisartan (MICARDIS) 20 MG tablet Take 20 mg by  mouth daily.   Yes [provider]  temazepam (RESTORIL) 15 MG capsule Take 1 capsule (15 mg total) by mouth at bedtime as needed for sleep. 07/20/17  Yes Eulas Post, Monica, DO  traMADol (ULTRAM) 50 MG tablet Take 2 tablets (100 mg total) by mouth 2 (two) times daily. 07/17/17  Yes Kinsinger, Arta Bruce, MD  ULORIC 40 MG tablet Take 40 mg by mouth daily.  01/29/14  Yes [provider]    Family History Family History  Problem Relation Age of Onset  . Hypertension Father   . Heart disease Father   . Cancer Father        prostate  . Heart disease Other        (Maternal side) Ischemic heart disease  . Diabetes Mellitus II Other   . Arrhythmia Mother   . Diabetes Mellitus II Mother        Borderline DM  . Hypertension Mother   . Asthma Mother   . Cancer Brother   . Dementia Brother   . Hypertension Brother   . Hypertension Daughter   . Hypertension Daughter   . Diabetes Daughter   . Hypertension Daughter   . Atrial fibrillation Daughter   . GER disease Daughter   . Hypertension Son   . Anxiety disorder Son   . Hypothyroidism Son     Social History Social History   Tobacco Use  . Smoking status: Never Smoker  . Smokeless tobacco: Never Used  Substance Use Topics  . Alcohol use: No  . Drug use: No     Allergies   Glucophage [metformin hcl]; Ace inhibitors; Advicor [niacin-lovastatin er]; Bystolic [nebivolol hcl]; Erythromycin; Lipitor [atorvastatin]; Lopid [gemfibrozil]; Statins; and Adhesive [tape]   Review of Systems Review of Systems  Constitutional: Negative for activity change, chills and fever.  HENT: Negative for congestion.   Respiratory: Negative for shortness of breath.   Cardiovascular: Negative for chest pain.  Gastrointestinal: Positive for abdominal pain, nausea and vomiting. Negative for anal bleeding, blood in stool, constipation and diarrhea.  Genitourinary: Negative for dysuria.  Musculoskeletal: Negative for back pain and neck pain.   Skin: Negative for rash.  Allergic/Immunologic: Negative for immunocompromised  state.  Neurological: Negative for dizziness, weakness and headaches.  Psychiatric/Behavioral: Negative for confusion.     Physical Exam Updated Vital Signs BP (!) 100/40 (BP Location: Left Arm)   Pulse 86   Temp 98.8 F (37.1 C) (Oral)   Resp 18   Ht 5\' 5"  (1.651 m)   Wt 88.5 kg (195 lb 1.7 oz)   SpO2 98%   BMI 32.47 kg/m   Physical Exam  Constitutional: No distress.  Uncomfortable appearing  HENT:  Head: Normocephalic.  Eyes: Conjunctivae are normal.  Neck: Normal range of motion. Neck supple. No JVD present. No tracheal deviation present. No thyromegaly present.  Cardiovascular: Normal rate, regular rhythm, normal heart sounds and intact distal pulses. Exam reveals no gallop and no friction rub.  No murmur heard. Pulmonary/Chest: Effort normal. No stridor. No respiratory distress. She has no wheezes. She has no rales. She exhibits no tenderness.  Abdominal: Soft. She exhibits distension. She exhibits no mass. There is tenderness. There is no rebound and no guarding. No hernia.  Diffuse tenderness to palpation to the abdomen without guarding or rebound.  Hypoactive bowel sounds.  No tingling or high-pitched bowel sounds noted.  Well-healing midline surgical incision without surrounding erythema, edema, or warmth.  Musculoskeletal: Normal range of motion. She exhibits no edema, tenderness or deformity.  Lymphadenopathy:    She has no cervical adenopathy.  Neurological: She is alert.  Skin: Skin is warm. Capillary refill takes less than 2 seconds. No rash noted.  Psychiatric: Her behavior is normal.  Nursing note and vitals reviewed.  ED Treatments / Results  Labs (all labs ordered are listed, but only abnormal results are displayed) Labs Reviewed  COMPREHENSIVE METABOLIC PANEL - Abnormal; Notable for the following components:      Result Value   Sodium 134 (*)    Potassium 6.5 (*)     Chloride 94 (*)    Glucose, Bld 229 (*)    BUN 37 (*)    Creatinine, Ser 2.68 (*)    Total Protein 8.5 (*)    GFR calc non Af Amer 17 (*)    GFR calc Af Amer 20 (*)    All other components within normal limits  CBC WITH DIFFERENTIAL/PLATELET - Abnormal; Notable for the following components:   WBC 13.1 (*)    Neutro Abs 10.3 (*)    All other components within normal limits  BASIC METABOLIC PANEL - Abnormal; Notable for the following components:   Potassium 5.4 (*)    Chloride 96 (*)    Glucose, Bld 124 (*)    BUN 40 (*)    Creatinine, Ser 2.80 (*)    GFR calc non Af Amer 16 (*)    GFR calc Af Amer 19 (*)    All other components within normal limits  LIPASE, BLOOD  URINALYSIS, ROUTINE W REFLEX MICROSCOPIC  CBC  BASIC METABOLIC PANEL  MAGNESIUM  I-STAT CG4 LACTIC ACID, ED  I-STAT CG4 LACTIC ACID, ED    EKG  EKG Interpretation  Date/Time:  Friday September 15 2017 15:55:00 EST Ventricular Rate:  86 PR Interval:    QRS Duration: 164 QT Interval:  449 QTC Calculation: 538 R Axis:   -69 Text Interpretation:  Sinus rhythm Consider left atrial enlargement Left bundle branch block when comapred to prior no significant changes seen.  No STEMI Confirmed by Antony Blackbird [phone]) on 09/15/2017 5:54:43 PM       Radiology Dg Abdomen Acute W/chest  Result Date: 09/15/2017 CLINICAL DATA:  Abdominal pain.  Colostomy reversal in December EXAM: DG ABDOMEN ACUTE W/ 1V CHEST COMPARISON:  None. FINDINGS: Multiple dilated loops of small bowel with air-fluid levels concerning for bowel obstruction. No radiopaque calculi or other significant radiographic abnormality is seen. Heart size and mediastinal contours are within normal limits. Both lungs are clear. Dual lead cardiac pacemaker. IMPRESSION: Multiple dilated loops of small bowel with air-fluid levels concerning for bowel obstruction. Electronically Signed   By: Kathreen Devoid   On: 09/15/2017 16:31   Dg Abd Portable 1 View  Result Date:  09/15/2017 CLINICAL DATA:  NG tube placement EXAM: PORTABLE ABDOMEN - 1 VIEW COMPARISON:  09/15/2017 at 1607 hours FINDINGS: Enteric tube terminates in the proximal gastric body. Mildly prominent loop of small bowel in the right mid abdomen, incompletely visualized. ICD leads, incompletely visualized. IMPRESSION: Enteric tube terminates in the proximal gastric body. Electronically Signed   By: Julian Hy M.D.   On: 09/15/2017 17:27    Procedures .Critical Care Performed by: Joanne Gavel, PA-C Authorized by: Joanne Gavel, PA-C   Critical care provider statement:    Critical care time (minutes):  45   Critical care time was exclusive of:  Separately billable procedures and treating other patients and teaching time   Critical care was necessary to treat or prevent imminent or life-threatening deterioration of the following conditions: Hyperkalemia.   Critical care was time spent personally by me on the following activities:  Discussions with consultants, examination of patient, ordering and review of radiographic studies, ordering and review of laboratory studies, ordering and performing treatments and interventions, re-evaluation of patient's condition, development of treatment plan with patient or surrogate and evaluation of patient's response to treatment   (including critical care time)  Medications Ordered in ED Medications  levothyroxine (SYNTHROID, LEVOTHROID) injection 50 mcg (50 mcg Intravenous Given 09/15/17 2152)  heparin injection 5,000 Units (5,000 Units Subcutaneous Given 09/15/17 2156)  0.45 % sodium chloride infusion ( Intravenous New Bag/Given 09/15/17 1902)  hydrALAZINE (APRESOLINE) injection 10 mg (not administered)  pantoprazole (PROTONIX) injection 40 mg (40 mg Intravenous Given 09/15/17 2157)  ondansetron (ZOFRAN-ODT) disintegrating tablet 4 mg ( Oral See Alternative 09/15/17 1909)    Or  ondansetron (ZOFRAN) injection 4 mg (4 mg Intravenous Given 09/15/17 1909)    morphine 4 MG/ML injection 1-2 mg (not administered)  diphenhydrAMINE (BENADRYL) 12.5 MG/5ML elixir 12.5 mg (not administered)    Or  diphenhydrAMINE (BENADRYL) injection 12.5 mg (not administered)  insulin aspart (novoLOG) injection 0-20 Units (not administered)  ondansetron (ZOFRAN-ODT) disintegrating tablet 4 mg (4 mg Oral Given 09/15/17 1500)  insulin aspart (novoLOG) injection 10 Units (10 Units Intravenous Given 09/15/17 1857)  sodium chloride 0.9 % bolus 1,000 mL (1,000 mLs Intravenous New Bag/Given 09/15/17 1857)  calcium gluconate 1 g in sodium chloride 0.9 % 100 mL IVPB (0 g Intravenous Stopped 09/15/17 2040)     Initial Impression / Assessment and Plan / ED Course  I have reviewed the triage vital signs and the nursing notes.  Pertinent labs & imaging results that were available during my care of the patient were reviewed by me and considered in my medical decision making (see chart for details).     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  No constitutional symptoms.  Abdominal x-ray with multiple  dilated loops of bowel and air-fluid levels concerning for bowel obstruction.  NG tube placed in the ED.  Repeat x-ray with enteric tube terminating in the proximal gastric body.  Patient has a new AKI, CR 2.68.  IV fluid bolus given.  Initial potassium 6.5.  EKG with QTc 538, will forego albuterol nebulizer treatment at this time.  Administered 10 of NovoLog and 1 g of calcium gluconate.  Repeat potassium 5.4. Spoke with PA Meuth from the general surgery team who will admit the patient for colonic obstruction. The patient appears reasonably stabilized for admission considering the current resources, flow, and capabilities available in the ED at this time, and I doubt any other  [facility] requiring further screening and/or treatment in the ED prior to admission.  Final Clinical Impressions(s) / ED Diagnoses   Final diagnoses:  Hyperkalemia  AKI (acute kidney injury) [facility])    ED Discharge Orders    None       Joanne Gavel, PA-C 09/15/17 2217    Tegeler, Gwenyth Allegra, MD 09/16/17 [phone]

## 2017-09-15 NOTE — ED Triage Notes (Signed)
Pt brought from home via [facility], n/v since last night. Unable to have stent removed as scheduled on Wed, no BM since Sat.  Pt sates to [facility] she has vomitted 6-7 times today, she is unable to take meds or food. Type 2 diabetic, CBG for [facility] 222, at home CBG 150 around 1200

## 2017-09-15 NOTE — ED Notes (Signed)
Family at bedside. 

## 2017-09-15 NOTE — Progress Notes (Signed)
Received patient from ED accompanied by family members.  Patient AOx4, VS stable with soft BP, NG tube at left nare, ambulatory, O2Sat at 98% on 2L/min via [place] and discomfort on abdomen due to distention.  Oriented to room, bed controls and call light.  Now resting on bed watching TV.  Will monitor.

## 2017-09-15 NOTE — H&P (Signed)
[facility] Surgery Admission Note  Crystal [name] 1950-08-26  [id number].    Requesting MD: Marda Stalker Chief Complaint/Reason for Consult: abdominal pain  HPI:  Crystal [name] is a 67yo female PMH significant for asthma, heart failure, pacemaker, CKD, depression, diabetes, who had a colostomy reversal 07/13/17 by Dr. Hulen Skains. She was recently discharged from the hospital 2/11 with a colonic anastomotic stricture leading to colonic obstruction. It resolved with TPN, bowel rest and anastomotic dilatation by GI Dr. Benson Norway. Unfortunately the stent fell out. She returned to the ED today with 1 week of worsening abdominal pain, nausea, and vomiting. She reports 6-7 episodes of n/v today. Last BM was almost a week ago, and she hasn't passed any gas in 2 days.  Labs and abdominal xray pending.  ROS: Review of Systems  Constitutional: Negative.   HENT: Negative.   Eyes: Negative.   Respiratory: Negative.   Cardiovascular: Negative.   Gastrointestinal: Positive for abdominal pain, constipation, nausea and vomiting.  Genitourinary: Negative.   Musculoskeletal: Negative.   Skin: Negative.   Neurological: Negative.    All systems reviewed and otherwise negative except for as above  Family History  Problem Relation Age of Onset  . Hypertension Father   . Heart disease Father   . Cancer Father        prostate  . Heart disease Other        (Maternal side) Ischemic heart disease  . Diabetes Mellitus II Other   . Arrhythmia Mother   . Diabetes Mellitus II Mother        Borderline DM  . Hypertension Mother   . Asthma Mother   . Cancer Brother   . Dementia Brother   . Hypertension Brother   . Hypertension Daughter   . Hypertension Daughter   . Diabetes Daughter   . Hypertension Daughter   . Atrial fibrillation Daughter   . GER disease Daughter   . Hypertension Son   . Anxiety disorder Son   . Hypothyroidism Son     Past Medical History:  Diagnosis Date  . AICD  (automatic cardioverter/defibrillator) present    high RV threshold chronically, device was turned off in 2014; Device battery has been dead x 7 years  . Anemia, iron deficiency   . Angioedema    felt to likely be due to ace inhibitors but says she has had this even off of medicines, appears to be tolerating ARBs chronically  . Anxiety   . Arthritis    "hands, legs, arms" (07/13/2017)  . Asthmatic bronchitis with status asthmaticus   . Bradycardia   . CHF (congestive heart failure) ([place])   . Chronic lower back pain   . Chronic pain   . Chronic renal insufficiency   . CKD (chronic kidney disease), stage III ([place])   . Coronary artery disease    Mild nonobstructive (30% LAD, 30% RCA) by 09/01/16 cath at [facility]  . Degenerative joint disease   . Depression   . Diabetes mellitus, type 2 ([place])   . Diabetic peripheral neuropathy ([place])   . Dizziness   . Dyspnea on exertion   . Family history of adverse reaction to anesthesia    Mother has nausea  . GERD (gastroesophageal reflux disease)   . Gout   . Heart valve disorder   . History of blood transfusion 1981; ~ 2005; 12/2016   "childbirth; defibrillator OR; colostomy OR"  . History of mononucleosis 03/2014  . Hyperlipidemia   . Hypertension   .  Hypothyroid   . Insomnia   . Intervertebral disc degeneration   . Ischemic cardiomyopathy   . LBBB (left bundle branch block)   . Myocardial infarction ([place]) dx'd ~ 2005  . Nonischemic cardiomyopathy ([place])    s/p ICD in 2005. EF has since recovered  . Obesity   . On home oxygen therapy    "2L at night" (07/13/2017)  . OSA on CPAP   . Pneumonia    "couple times; last time was 12/2016" (07/13/2017)  . PONV (postoperative nausea and vomiting)   . Swelling   . Syncope   . Systemic hypertension   . Vitamin D deficiency     Past Surgical History:  Procedure Laterality Date  . APPENDECTOMY  07/13/2017  . BLADDER SUSPENSION  1990   "w/hysterectomy"  . CARDIAC CATHETERIZATION   2005   no obstructive CAD per patient  . CARDIAC CATHETERIZATION  2018  . CARDIAC DEFIBRILLATOR PLACEMENT  2005   BiV ICD implanted,  LV lead is an epicardial lead  . CARPAL TUNNEL RELEASE Left 07/25/2014   Dr.Williamson   . CATARACT EXTRACTION W/ INTRAOCULAR LENS  IMPLANT, BILATERAL Bilateral   . COLONIC STENT PLACEMENT N/A 08/31/2017   Procedure: COLONIC STENT PLACEMENT;  Surgeon: Carol Ada, MD;  Location: [place];  Service: Endoscopy;  Laterality: N/A;  . COLONOSCOPY     2010-2011 Dr.Kipreos   . COLOSTOMY  12/2016   Archie Endo 01/26/2017  . COLOSTOMY REVERSAL  07/13/2017  . COLOSTOMY REVERSAL N/A 07/13/2017   Procedure: COLOSTOMY REVERSAL;  Surgeon: Judeth Horn, MD;  Location: [place];  Service: General;  Laterality: N/A;  . CYST REMOVAL HAND Right 05/2013   thumb  . DILATION AND CURETTAGE OF UTERUS  X 5-6  . FLEXIBLE SIGMOIDOSCOPY N/A 08/24/2017   Procedure: FLEXIBLE SIGMOIDOSCOPY;  Surgeon: Carol Ada, MD;  Location: [place];  Service: Endoscopy;  Laterality: N/A;  . FLEXIBLE SIGMOIDOSCOPY N/A 08/31/2017   Procedure: FLEXIBLE SIGMOIDOSCOPY;  Surgeon: Carol Ada, MD;  Location: [place];  Service: Endoscopy;  Laterality: N/A;  stent placement  . IMPLANTABLE CARDIOVERTER DEFIBRILLATOR GENERATOR CHANGE  2008  . TOTAL ABDOMINAL HYSTERECTOMY  1990  . TRIGGER FINGER RELEASE Right 05/213  . TUBAL LIGATION  1980s  . VESICOVAGINAL FISTULA CLOSURE W/ TAH      Social History:  reports that  has never smoked. she has never used smokeless tobacco. She reports that she does not drink alcohol or use drugs.  Allergies:  Allergies  Allergen Reactions  . Glucophage [Metformin Hcl] Other (See Comments)    Renal failure  . Ace Inhibitors Swelling and Other (See Comments)    Angioedema  . Advicor [Niacin-Lovastatin Er] Other (See Comments)    Muscle aches  . Bystolic [Nebivolol Hcl] Other (See Comments)    Edema  . Erythromycin Diarrhea, Nausea And Vomiting and Other (See  Comments)    *DERIVATIVES*  . Lipitor [Atorvastatin] Other (See Comments)    Muscle aches  . Lopid [Gemfibrozil] Other (See Comments)    Muscle aches  . Statins Other (See Comments)    Muscle aches  . Adhesive [Tape] Rash     (Not in a hospital admission)  Prior to Admission medications   Medication Sig Start Date End Date Taking? Authorizing Provider  acetaminophen (TYLENOL) 500 MG tablet Take 1,000 mg by mouth every 6 (six) hours as needed for moderate pain or headache.    [provider]  carvedilol (COREG) 12.5 MG tablet Take 12.5 mg by mouth 2 (two) times  daily with a meal.    [provider]  CRESTOR 10 MG tablet Take 1 tablet (10 mg total) by mouth at bedtime. 02/15/17   Love, Ivan Anchors, PA-C  escitalopram (LEXAPRO) 10 MG tablet Take 10 mg by mouth at bedtime.    [provider]  ferrous sulfate 325 (65 FE) MG EC tablet Take 325 mg by mouth daily.     [provider]  fluticasone (FLONASE) 50 MCG/ACT nasal spray SHAKE LIQUID AND USE 1 SPRAY IN EACH NOSTRIL TWICE DAILY 08/17/17   Lauree Chandler, NP  gabapentin (NEURONTIN) 100 MG capsule Take 100-800 mg by mouth See admin instructions. Take 100 mg by mouth in the morning and take 800 mg by mouth at bedtime    [provider]  Insulin Glargine (TOUJEO SOLOSTAR) 300 UNIT/ML SOPN Inject 50 Units into the skin 2 (two) times daily. 07/17/17   Kinsinger, Arta Bruce, MD  Insulin Glulisine (APIDRA) 100 UNIT/ML Solostar Pen Inject 20 Units into the skin 3 (three) times daily.    [provider]  levothyroxine (SYNTHROID, LEVOTHROID) 88 MCG tablet Take 88 mcg by mouth daily.  10/16/15   [provider]  loratadine (CLARITIN) 10 MG tablet Take 10 mg by mouth daily.    [provider]  Misc. Devices MISC by Does not apply route. C-pap    [provider]  montelukast (SINGULAIR) 10 MG tablet Take 10 mg by mouth at bedtime.    [provider]  omega-3 acid  ethyl esters (LOVAZA) 1 g capsule Take 4 g by mouth at bedtime.     [provider]  Omega-3 Fatty Acids (FISH OIL) 1000 MG CAPS Take 1,000 capsules by mouth daily.    [provider]  ondansetron (ZOFRAN-ODT) 4 MG disintegrating tablet Take 4 mg by mouth every 6 (six) hours as needed. 07/20/17   [provider]  OXYGEN Inhale 2 L into the lungs.    [provider]  pantoprazole (PROTONIX) 40 MG injection Inject 40 mg into the vein at bedtime. 09/04/17   Judeth Horn, MD  promethazine (PHENERGAN) 25 MG tablet Take 25 mg by mouth every 6 (six) hours as needed for nausea or vomiting.    [provider]  temazepam (RESTORIL) 15 MG capsule Take 1 capsule (15 mg total) by mouth at bedtime as needed for sleep. 07/20/17   Gildardo Cranker, DO  traMADol (ULTRAM) 50 MG tablet Take 2 tablets (100 mg total) by mouth 2 (two) times daily. 07/17/17   Kinsinger, Arta Bruce, MD  ULORIC 40 MG tablet Take 40 mg by mouth daily.  01/29/14   [provider]    Blood pressure (!) 97/58, pulse 98, temperature (!) 97.5 F (36.4 C), temperature source Oral, resp. rate 16, height 5\' 5"  (1.651 m), weight 200 lb (90.7 kg), SpO2 96 %. Physical Exam: General: pleasant, WD/WN white female who is laying in bed in NAD but appears very uncomfortable HEENT: head is normocephalic, atraumatic.  Sclera are noninjected.  Pupils equal and round.  Ears and nose without any masses or lesions.  Mouth is pink and moist. Dentition poor Heart: regular, rate, and rhythm.  No obvious murmurs, gallops, or rubs noted.  Palpable pedal pulses bilaterally Lungs: CTAB, no wheezes, rhonchi, or rales noted.  Respiratory effort nonlabored Abd: well healed midline and RLQ incisions, soft, distended, hypoactive +BS, mild global tenderness without rebound or guarding MS: all 4 extremities are symmetrical with no cyanosis, clubbing, or edema. Skin: warm  and dry with no masses, lesions, or rashes Psych:  A&Ox3 with an appropriate affect. Neuro: cranial nerves grossly intact, extremity CSM intact bilaterally, normal speech  No results found for this or any previous visit (from the past 48 hour(s)). No results found.    Assessment/Plan Asthma Depression DM Hx of CAD Hx of CHF - ECHO 03/31/16 showed 45% - 50% Pacemaker in place CKD stage III  Abdominal pain, nausea, vomiting Colonic obstruction - Labs pending.  Admit to med-surg. NG tube for decompression. Will discuss with MD, but patient may benefit from colostomy due to quick recurrence of obstruction.  Wellington Hampshire, [facility] Surgery 09/15/2017, 4:23 PM Pager: [phone] Consults: [phone] Mon-Fri 7:00 am-4:30 pm Sat-Sun 7:00 am-11:30 am

## 2017-09-15 NOTE — ED Notes (Signed)
Pt had another episode of emesis over in radiology. Pt BP and o2 sensors changed.

## 2017-09-16 DIAGNOSIS — E1122 Type 2 diabetes mellitus with diabetic chronic kidney disease: Secondary | ICD-10-CM | POA: Diagnosis not present

## 2017-09-16 DIAGNOSIS — N179 Acute kidney failure, unspecified: Secondary | ICD-10-CM | POA: Diagnosis not present

## 2017-09-16 DIAGNOSIS — I129 Hypertensive chronic kidney disease with stage 1 through stage 4 chronic kidney disease, or unspecified chronic kidney disease: Secondary | ICD-10-CM | POA: Diagnosis not present

## 2017-09-16 DIAGNOSIS — N183 Chronic kidney disease, stage 3 (moderate): Secondary | ICD-10-CM | POA: Diagnosis not present

## 2017-09-16 DIAGNOSIS — E869 Volume depletion, unspecified: Secondary | ICD-10-CM | POA: Diagnosis not present

## 2017-09-16 DIAGNOSIS — E875 Hyperkalemia: Secondary | ICD-10-CM | POA: Diagnosis not present

## 2017-09-16 LAB — GLUCOSE, CAPILLARY
GLUCOSE-CAPILLARY: 70 mg/dL (ref 65–99)
GLUCOSE-CAPILLARY: 45 mg/dL — AB (ref 65–99)
Glucose-Capillary: 119 mg/dL — ABNORMAL HIGH (ref 65–99)
Glucose-Capillary: 95 mg/dL (ref 65–99)
Glucose-Capillary: 100 mg/dL — ABNORMAL HIGH (ref 65–99)
Glucose-Capillary: 66 mg/dL (ref 65–99)

## 2017-09-16 LAB — CBC
HCT: 37.8 % (ref 36.0–46.0)
Hemoglobin: 12 g/dL (ref 12.0–15.0)
MCH: 26.7 pg (ref 26.0–34.0)
MCHC: 31.7 g/dL (ref 30.0–36.0)
MCV: 84 fL (ref 78.0–100.0)
PLATELETS: 322 10*3/uL (ref 150–400)
RBC: 4.5 MIL/uL (ref 3.87–5.11)
RDW: 15.3 % (ref 11.5–15.5)
WBC: 7.4 10*3/uL (ref 4.0–10.5)

## 2017-09-16 LAB — BASIC METABOLIC PANEL
ANION GAP: 15 (ref 5–15)
Anion gap: 19 — ABNORMAL HIGH (ref 5–15)
BUN: 48 mg/dL — ABNORMAL HIGH (ref 6–20)
BUN: 50 mg/dL — ABNORMAL HIGH (ref 6–20)
CALCIUM: 9.5 mg/dL (ref 8.9–10.3)
CHLORIDE: 93 mmol/L — AB (ref 101–111)
CO2: 27 mmol/L (ref 22–32)
CO2: 25 mmol/L (ref 22–32)
CREATININE: 3.48 mg/dL — AB (ref 0.44–1.00)
Calcium: 8.5 mg/dL — ABNORMAL LOW (ref 8.9–10.3)
Chloride: 94 mmol/L — ABNORMAL LOW (ref 101–111)
Creatinine, Ser: 3.72 mg/dL — ABNORMAL HIGH (ref 0.44–1.00)
GFR calc Af Amer: 15 mL/min — ABNORMAL LOW (ref >60)
GFR calc non Af Amer: 13 mL/min — ABNORMAL LOW (ref >60)
GFR, EST AFRICAN AMERICAN: 13 mL/min — AB (ref >60)
GFR, EST NON AFRICAN AMERICAN: 12 mL/min — AB (ref >60)
Glucose, Bld: 118 mg/dL — ABNORMAL HIGH (ref 65–99)
Glucose, Bld: 71 mg/dL (ref 65–99)
Potassium: 6 mmol/L — ABNORMAL HIGH (ref 3.5–5.1)
Potassium: 4.5 mmol/L (ref 3.5–5.1)
SODIUM: 137 mmol/L (ref 135–145)
Sodium: 136 mmol/L (ref 135–145)

## 2017-09-16 LAB — URINALYSIS, ROUTINE W REFLEX MICROSCOPIC
BILIRUBIN URINE: NEGATIVE
Glucose, UA: NEGATIVE mg/dL
HGB URINE DIPSTICK: NEGATIVE
KETONES UR: NEGATIVE mg/dL
Leukocytes, UA: NEGATIVE
Nitrite: NEGATIVE
PH: 5 (ref 5.0–8.0)
Protein, ur: NEGATIVE mg/dL
SPECIFIC GRAVITY, URINE: 1.014 (ref 1.005–1.030)

## 2017-09-16 LAB — SODIUM, URINE, RANDOM: SODIUM UR: 26 mmol/L

## 2017-09-16 LAB — CREATININE, URINE, RANDOM: CREATININE, URINE: 148.51 mg/dL

## 2017-09-16 MED ORDER — DEXTROSE 50 % IV SOLN
INTRAVENOUS | Status: AC
Start: 2017-09-16 — End: 2017-09-16
  Administered 2017-09-16: 50 mL
  Filled 2017-09-16: qty 50

## 2017-09-16 MED ORDER — SODIUM CHLORIDE 0.9 % IV SOLN
2.0000 g | Freq: Once | INTRAVENOUS | Status: AC
  Administered 2017-09-16: 2 g via INTRAVENOUS
  Filled 2017-09-16: qty 20

## 2017-09-16 MED ORDER — DEXTROSE-NACL 5-0.45 % IV SOLN
INTRAVENOUS | Status: DC
  Administered 2017-09-16 – 2017-09-18 (×6): via INTRAVENOUS
  Administered 2017-09-19: 1000 mL via INTRAVENOUS
  Administered 2017-09-19 (×2): via INTRAVENOUS

## 2017-09-16 MED ORDER — DEXTROSE 50 % IV SOLN
50.0000 mL | Freq: Once | INTRAVENOUS | Status: DC
Start: 2017-09-16 — End: 2017-09-26
  Filled 2017-09-16: qty 50

## 2017-09-16 MED ORDER — SODIUM CHLORIDE 0.9 % IV BOLUS (SEPSIS)
500.0000 mL | Freq: Once | INTRAVENOUS | Status: AC
  Administered 2017-09-16: 500 mL via INTRAVENOUS

## 2017-09-16 MED ORDER — SODIUM CHLORIDE 0.9 % IV BOLUS (SEPSIS)
1000.0000 mL | Freq: Once | INTRAVENOUS | Status: AC
  Administered 2017-09-16: 1000 mL via INTRAVENOUS

## 2017-09-16 NOTE — Progress Notes (Signed)
Foley catheter output remains low despite 2 iv boluses given this morning

## 2017-09-16 NOTE — Progress Notes (Signed)
[facility] Progress Note     Subjective: CC: abdominal distention Patient with continued abdominal distention, but with some improvement since NGT placement. Only mild abdominal pain. Some nausea.  UOP low.  VSS.   Objective: Vital signs in last 24 hours: Temp:  [97.5 F (36.4 C)-99 F (37.2 C)] 99 F (37.2 C) (02/23 0551) Pulse Rate:  [81-98] 81 (02/23 0551) Resp:  [14-18] 18 (02/23 0551) BP: (88-117)/(40-72) 88/48 (02/23 0551) SpO2:  [90 %-98 %] 96 % (02/23 0551) Weight:  [88.5 kg (195 lb 1.7 oz)-90.7 kg (200 lb)] 88.5 kg (195 lb 1.7 oz) (02/22 2042) Last BM Date: 09/08/17  Intake/Output from previous day: 02/22 0701 - 02/23 0700 In: 1326.7 [I.V.:1186.7; NG/GT:30; IV Piggyback:110] Out: 900 [Emesis/NG output:900] Intake/Output this shift: No intake/output data recorded.  PE: Gen:  Alert, NAD, pleasant Card:  Regular rate and rhythm, pedal pulses 2+ BL Pulm:  Normal effort, clear to auscultation bilaterally Abd: Soft, minimally tender, distended, bowel sounds present, no HSM, incisions C/D/I Skin: warm and dry, no rashes  Psych: A&Ox3   Lab Results:  Recent Labs    09/15/17 1629 09/16/17 0524  WBC 13.1* 7.4  HGB 13.6 12.0  HCT 41.6 37.8  PLT 345 322   BMET Recent Labs    09/15/17 2042 09/16/17 0524  NA 136 136  K 5.4* 6.0*  CL 96* 94*  CO2 26 27  GLUCOSE 124* 118*  BUN 40* 48*  CREATININE 2.80* 3.72*  CALCIUM 9.8 9.5   PT/INR No results for input(s): LABPROT, INR in the last 72 hours. CMP     Component Value Date/Time   NA 136 09/16/2017 0524   NA 137 02/18/2016   K 6.0 (H) 09/16/2017 0524   CL 94 (L) 09/16/2017 0524   CO2 27 09/16/2017 0524   GLUCOSE 118 (H) 09/16/2017 0524   BUN 48 (H) 09/16/2017 0524   BUN 36 (A) 07/26/2016   CREATININE 3.72 (H) 09/16/2017 0524   CREATININE 3.10 (H) 08/17/2017 1545   CALCIUM 9.5 09/16/2017 0524   PROT 8.5 (H) 09/15/2017 1629   ALBUMIN 4.2 09/15/2017 1629   AST 19 09/15/2017 1629   ALT  16 09/15/2017 1629   ALKPHOS 104 09/15/2017 1629   BILITOT 1.1 09/15/2017 1629   GFRNONAA 12 (L) 09/16/2017 0524   GFRNONAA 15 (L) 08/17/2017 1545   GFRAA 13 (L) 09/16/2017 0524   GFRAA 17 (L) 08/17/2017 1545   Lipase     Component Value Date/Time   LIPASE 35 09/15/2017 1629       Studies/Results: Dg Abdomen Acute W/chest  Result Date: 09/15/2017 CLINICAL DATA:  Abdominal pain.  Colostomy reversal in December EXAM: DG ABDOMEN ACUTE W/ 1V CHEST COMPARISON:  None. FINDINGS: Multiple dilated loops of small bowel with air-fluid levels concerning for bowel obstruction. No radiopaque calculi or other significant radiographic abnormality is seen. Heart size and mediastinal contours are within normal limits. Both lungs are clear. Dual lead cardiac pacemaker. IMPRESSION: Multiple dilated loops of small bowel with air-fluid levels concerning for bowel obstruction. Electronically Signed   By: Kathreen Devoid   On: 09/15/2017 16:31   Dg Abd Portable 1 View  Result Date: 09/15/2017 CLINICAL DATA:  NG tube placement EXAM: PORTABLE ABDOMEN - 1 VIEW COMPARISON:  09/15/2017 at 1607 hours FINDINGS: Enteric tube terminates in the proximal gastric body. Mildly prominent loop of small bowel in the right mid abdomen, incompletely visualized. ICD leads, incompletely visualized. IMPRESSION: Enteric tube terminates in the proximal gastric body. Electronically  Signed   By: Julian Hy M.D.   On: 09/15/2017 17:27    Anti-infectives: Anti-infectives (From admission, onward)   None       Assessment/Plan Asthma Depression DM Hx of CAD Hx of CHF - ECHO 03/31/16 showed 45% - 50% Pacemaker in place CKD stage III  Abdominal pain, nausea, vomiting Colonic obstruction - NGT with 900 cc out - continue on LIWS - no flatus - likely will need end stoma early next week - will start on TPN Hyperkalemia - K 6.0 this AM, reordered calcium gluconate AKI - Cr elevated to 3.7 from 2.8, increase IVF to 150 cc/h  and give another 500 cc fluid bolus  FEN: NPO, IVF; NGT to LIWS - will consult pharmacy for TPN VTE: SCDs, SQ heparin  ID:no current abx  LOS: 0 days    [name] , [facility] Surgery 09/16/2017, 7:31 AM Pager: [phone] Consults: [phone] Mon-Fri 7:00 am-4:30 pm Sat-Sun 7:00 am-11:30 am

## 2017-09-16 NOTE — Progress Notes (Signed)
Pt had 2 episodes of bowel movements this pm. Stool consistency similar to contents in suction canister. Pt also reports passing gas

## 2017-09-16 NOTE — Progress Notes (Signed)
Aggressive hydration ordered this morning.13fr  foley catheter inserted as ordered, pt tolerated procedure well

## 2017-09-16 NOTE — Progress Notes (Signed)
PHARMACY - ADULT TOTAL PARENTERAL NUTRITION CONSULT NOTE   Pharmacy Consult:  TPN Indication:  Recurrent SBO  Patient Measurements: Height: 5\' 5"  (165.1 cm) Weight: 195 lb 1.7 oz (88.5 kg) IBW/kg (Calculated) : 57 TPN AdjBW (KG): 64.9 Body mass index is 32.47 kg/m.   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  She presented to [facility] ED on 09/15/17 with abdominal pain, distention, nausea and vomiting. Her stent for colonic anastomotic stricture fell out.  Patient will likely need end stoma next week, so Pharmacy consulted to start TPN.  GI: NG O/P 966mL - PPI IV Endo: IV Synthroid. DM - CBGs low normal without nutrition Insulin requirements in the past 24 hours: N/A Lytes: K+ 6, low CL, Ca high normal Renal: AKI - SCr up 3.72, BUN 48 - 1/2NS at 150 ml/hr Pulm: RA >> 2L [place]  Cards: BP low-low normal Hepatobil: LFTs / tbili WNL ID: [email or web address], WBC WNL - not on abx TPN Access: no access today per [facility] TPN start date: 09/16/17  Nutritional Goals (per RD recommendations on 2/6): 1750-1950 kCal and 100-115gm protein per day  Current Nutrition:  NPO   Plan:  Hold off on TPN today per [facility]   Obaloluwa Delatte D. Mina Marble, PharmD, BCPS Pager:  [phone] 09/16/2017, 10:43 AM

## 2017-09-16 NOTE — Consult Note (Addendum)
Renal Service Consult Note [facility]  Crystal [name] 09/16/2017 Crystal [name] Requesting Physician: Crystal Crystal [name]  Reason for Consult:  Acute / chronic renal failure HPI: The patient is a 67 y.o. year-old with hx of CHF, PPM, obeisty, CKD, DM.  She was admitted for N/V and recurrent colonic bowel obstruction, with N/V and hypotension. Admit creat was 2.6 yesterday, was up to 3.7 today with K 6.0.  Asked to see for acute /chronic renal failure.   Pt states she had severe Cdif w/ complications including necrosis and required partial colectomy w/ colostomy.  She underwent takedown of the colostomy in Dec 2018 and did well until Feb 2018 when she had bowel obstruction due to colonic stricture.  Was admitted, colon dilated and stented, and was dc'd on 09/04/17.  The stent fell out.  She has had N/V for the last 6 days w/o good BM.  Admitted yesterday by gen surg with NG tube placement, tentatively planning to redo colostomy.    Pt grew up [place] [name], [place] [name].  She is widowed, no etoh/ tob, lives w her daughter and her family.  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  Father was in furniture making nad her mother was CNA.    Denies any leg swelling, SOB, cough, orthopnea.  She has a foley cath in place.   She says she has seen Crystal [name] from [facility] in the past.    ROS  denies CP  no joint pain   no HA  no blurry vision  no rash  no diarrhea  no nausea/ vomiting   Past Medical History  Past Medical History:  Diagnosis Date  . AICD (automatic cardioverter/defibrillator) present    high RV threshold chronically, device was turned off in 2014; Device battery has been dead x 7 years  . Anemia, iron deficiency   . Angioedema    felt to likely be due to ace inhibitors but says she has had this even off of medicines, appears to be tolerating ARBs chronically  . Anxiety   . Arthritis    "hands, legs, arms" (07/13/2017)  . Asthmatic bronchitis with status asthmaticus   . Bradycardia    . CHF (congestive heart failure) ([place])   . Chronic lower back pain   . Chronic pain   . Chronic renal insufficiency   . CKD (chronic kidney disease), stage III ([place])   . Coronary artery disease    Mild nonobstructive (30% LAD, 30% RCA) by 09/01/16 cath at [facility]  . Degenerative joint disease   . Depression   . Diabetes mellitus, type 2 ([place])   . Diabetic peripheral neuropathy ([place])   . Dizziness   . Dyspnea on exertion   . Family history of adverse reaction to anesthesia    Mother has nausea  . GERD (gastroesophageal reflux disease)   . Gout   . Heart valve disorder   . History of blood transfusion 1981; ~ 2005; 12/2016   "childbirth; defibrillator OR; colostomy OR"  . History of mononucleosis 03/2014  . Hyperlipidemia   . Hypertension   . Hypothyroid   . Insomnia   . Intervertebral disc degeneration   . Ischemic cardiomyopathy   . LBBB (left bundle branch block)   . Myocardial infarction ([place]) dx'd ~ 2005  . Nonischemic cardiomyopathy ([place])    s/p ICD in 2005. EF has since recovered  . Obesity   . On home oxygen therapy    "2L at night" (07/13/2017)  . OSA on  CPAP   . Pneumonia    "couple times; last time was 12/2016" (07/13/2017)  . PONV (postoperative nausea and vomiting)   . Swelling   . Syncope   . Systemic hypertension   . Vitamin D deficiency    Past Surgical History  Past Surgical History:  Procedure Laterality Date  . APPENDECTOMY  07/13/2017  . BLADDER SUSPENSION  1990   "w/hysterectomy"  . CARDIAC CATHETERIZATION  2005   no obstructive CAD per patient  . CARDIAC CATHETERIZATION  2018  . CARDIAC DEFIBRILLATOR PLACEMENT  2005   BiV ICD implanted,  LV lead is an epicardial lead  . CARPAL TUNNEL RELEASE Left 07/25/2014   Crystal.[place]   . CATARACT EXTRACTION W/ INTRAOCULAR LENS  IMPLANT, BILATERAL Bilateral   . COLONIC STENT PLACEMENT N/A 08/31/2017   Procedure: COLONIC STENT PLACEMENT;  Surgeon: Crystal Ada, MD;  Location: [place];   Service: Endoscopy;  Laterality: N/A;  . COLONOSCOPY     2010-2011 Crystal.[place]   . COLOSTOMY  12/2016   Crystal [name] 01/26/2017  . COLOSTOMY REVERSAL  07/13/2017  . COLOSTOMY REVERSAL N/A 07/13/2017   Procedure: COLOSTOMY REVERSAL;  Surgeon: Crystal Horn, MD;  Location: [place];  Service: General;  Laterality: N/A;  . CYST REMOVAL HAND Right 05/2013   thumb  . DILATION AND CURETTAGE OF UTERUS  X 5-6  . FLEXIBLE SIGMOIDOSCOPY N/A 08/24/2017   Procedure: FLEXIBLE SIGMOIDOSCOPY;  Surgeon: Crystal Ada, MD;  Location: [place];  Service: Endoscopy;  Laterality: N/A;  . FLEXIBLE SIGMOIDOSCOPY N/A 08/31/2017   Procedure: FLEXIBLE SIGMOIDOSCOPY;  Surgeon: Crystal Ada, MD;  Location: [place];  Service: Endoscopy;  Laterality: N/A;  stent placement  . IMPLANTABLE CARDIOVERTER DEFIBRILLATOR GENERATOR CHANGE  2008  . TOTAL ABDOMINAL HYSTERECTOMY  1990  . TRIGGER FINGER RELEASE Right 05/213  . TUBAL LIGATION  1980s  . VESICOVAGINAL FISTULA CLOSURE W/ TAH     Family History  Family History  Problem Relation Age of Onset  . Hypertension Father   . Heart disease Father   . Cancer Father        prostate  . Heart disease Other        (Maternal side) Ischemic heart disease  . Diabetes Mellitus II Other   . Arrhythmia Mother   . Diabetes Mellitus II Mother        Borderline DM  . Hypertension Mother   . Asthma Mother   . Cancer Brother   . Dementia Brother   . Hypertension Brother   . Hypertension Daughter   . Hypertension Daughter   . Diabetes Daughter   . Hypertension Daughter   . Atrial fibrillation Daughter   . GER disease Daughter   . Hypertension Son   . Anxiety disorder Son   . Hypothyroidism Son    Social History  reports that  has never smoked. she has never used smokeless tobacco. She reports that she does not drink alcohol or use drugs. Allergies  Allergies  Allergen Reactions  . Glucophage [Metformin Hcl] Other (See Comments)    Renal failure  . Ace Inhibitors Swelling  and Other (See Comments)    Angioedema  . Advicor [Niacin-Lovastatin Er] Other (See Comments)    Muscle aches  . Bystolic [Nebivolol Hcl] Other (See Comments)    Edema  . Erythromycin Diarrhea, Nausea And Vomiting and Other (See Comments)    *DERIVATIVES*  . Lipitor [Atorvastatin] Other (See Comments)    Muscle aches  . Lopid [Gemfibrozil] Other (See Comments)    Muscle  aches  . Statins Other (See Comments)    Muscle aches  . Adhesive [Tape] Rash   Home medications Prior to Admission medications   Medication Sig Start Date End Date Taking? Authorizing Provider  acetaminophen (TYLENOL) 500 MG tablet Take 1,000 mg by mouth every 6 (six) hours as needed for moderate pain or headache.   Yes [provider]  carvedilol (COREG) 12.5 MG tablet Take 12.5 mg by mouth 2 (two) times daily with a meal.   Yes [provider]  CRESTOR 10 MG tablet Take 1 tablet (10 mg total) by mouth at bedtime. 02/15/17  Yes Love, Ivan Anchors, PA-C  escitalopram (LEXAPRO) 10 MG tablet Take 10 mg by mouth at bedtime.   Yes [provider]  ferrous sulfate 325 (65 FE) MG EC tablet Take 325 mg by mouth daily.    Yes [provider]  gabapentin (NEURONTIN) 100 MG capsule Take 100-800 mg by mouth See admin instructions. Take 100 mg by mouth in the morning and take 800 mg by mouth at bedtime   Yes [provider]  Insulin Glargine (TOUJEO SOLOSTAR) 300 UNIT/ML SOPN Inject 50 Units into the skin 2 (two) times daily. Patient taking differently: Inject 100 Units into the skin daily.  07/17/17  Yes Kinsinger, Arta Bruce, MD  Insulin Glulisine (APIDRA) 100 UNIT/ML Solostar Pen Inject 20 Units into the skin 3 (three) times daily.   Yes [provider]  levothyroxine (SYNTHROID, LEVOTHROID) 88 MCG tablet Take 88 mcg by mouth daily.  10/16/15  Yes [provider]  loratadine (CLARITIN) 10 MG tablet Take 10 mg by mouth daily.   Yes [provider]  Misc. Devices  MISC by Does not apply route. C-pap   Yes [provider]  montelukast (SINGULAIR) 10 MG tablet Take 10 mg by mouth at bedtime.   Yes [provider]  omega-3 acid ethyl esters (LOVAZA) 1 g capsule Take 4 g by mouth at bedtime.    Yes [provider]  OXYGEN Inhale 2 L into the lungs.   Yes [provider]  telmisartan (MICARDIS) 20 MG tablet Take 20 mg by mouth daily.   Yes [provider]  temazepam (RESTORIL) 15 MG capsule Take 1 capsule (15 mg total) by mouth at bedtime as needed for sleep. 07/20/17  Yes Eulas Post, Monica, DO  traMADol (ULTRAM) 50 MG tablet Take 2 tablets (100 mg total) by mouth 2 (two) times daily. 07/17/17  Yes Kinsinger, Arta Bruce, MD  ULORIC 40 MG tablet Take 40 mg by mouth daily.  01/29/14  Yes [provider]   Liver Function Tests Recent Labs  Lab 09/15/17 1629  AST 19  ALT 16  ALKPHOS 104  BILITOT 1.1  PROT 8.5*  ALBUMIN 4.2   Recent Labs  Lab 09/15/17 1629  LIPASE 35   CBC Recent Labs  Lab 09/15/17 1629 09/16/17 0524  WBC 13.1* 7.4  NEUTROABS 10.3*  --   HGB 13.6 12.0  HCT 41.6 37.8  MCV 82.9 84.0  PLT 345 782   Basic Metabolic Panel Recent Labs  Lab 09/15/17 1629 09/15/17 2042 09/16/17 0524 09/16/17 1217  NA 134* 136 136 137  K 6.5* 5.4* 6.0* 4.5  CL 94* 96* 94* 93*  CO2 25 26 27 25   GLUCOSE 229* 124* 118* 71  BUN 37* 40* 48* 50*  CREATININE 2.68* 2.80* 3.72* 3.48*  CALCIUM 10.2 9.8 9.5 8.5*   Iron/TIBC/Ferritin/ %Sat No results found for: IRON, TIBC, FERRITIN, IRONPCTSAT  Vitals:   09/15/17 1930 09/15/17 2042 09/16/17 0551 09/16/17 1350  BP: 105/63 (!) 100/40 (!) 88/48 (!) 94/38  Pulse: 85 86 81 77  Resp: 14 18 18 20   Temp:  98.8 F (37.1 C) 99 F (37.2 C) 98 F (36.7 C)  TempSrc:  Oral Oral Oral  SpO2: 97% 98% 96% 97%  Weight:  88.5 kg (195 lb 1.7 oz)    Height:  5\' 5"  (1.651 m)     Exam Gen pleasant obese WF, no distress, lying flat, NG tube in No rash, cyanosis  or gangrene Sclera anicteric, throat clear  No jvd or bruits Chest clear bilat to bases RRR no MRG Abd soft ntnd no mass or ascites +bs obese GU foley cath draining dark amber urine, about 400 cc in the bag MS no joint effusions or deformity Ext no LE or UE edema, no wounds or ulcers Neuro is alert, Ox 3 , nf  Renal US from Jan 2019 > 11- 12 cm kidneys, no hydro , normal echotexture No UA yet No CXR done Na 137  K 4.5  BUN 50 CR 3.48 (down from 3.72 this am)  WBC 7k Hb 12 Baseline creat is  AKI  Jul 2018 peak Cr 2.9 > 1.3 AKI Aug 10 2017 peak Cr 6.0 > 1.6  AKI Aug 22 2017 peak Cr 4.4 > 1.2  Home meds: -telmisartan 20 qd/ coreg 12.5 bid -statin/ insulin/ uloric/ T4 / singulair -lexapro/ restoril/ ultram/ neurontin   Impression: 1  Recurrent acute on chronic renal failure - most likely vol depletion + ARB effect.  Still vol depleted.  Would avoid ARB/ ACEi's for this patient, hx of AKI x 3 in past year.  Use CCB, BB, others for HTN, but avoid ACEi/ ARB in the future.  Cont IVF's at 150/hr.  2. CKD baseline creat 1.3- 1.6 3  Colonic obstruction - NG in placed 4  HTN - bp's soft,  meds on hold appropriately 5  Obesity 6  DM on insulin 7  Hyperkalemia , improving   Plan - as above  Kelly Splinter MD [employer] pager [phone]   09/16/2017, 5:27 PM

## 2017-09-17 DIAGNOSIS — E1122 Type 2 diabetes mellitus with diabetic chronic kidney disease: Secondary | ICD-10-CM | POA: Diagnosis present

## 2017-09-17 DIAGNOSIS — E875 Hyperkalemia: Secondary | ICD-10-CM | POA: Diagnosis not present

## 2017-09-17 DIAGNOSIS — Z9981 Dependence on supplemental oxygen: Secondary | ICD-10-CM | POA: Diagnosis not present

## 2017-09-17 DIAGNOSIS — E1142 Type 2 diabetes mellitus with diabetic polyneuropathy: Secondary | ICD-10-CM | POA: Diagnosis present

## 2017-09-17 DIAGNOSIS — M109 Gout, unspecified: Secondary | ICD-10-CM | POA: Diagnosis present

## 2017-09-17 DIAGNOSIS — F329 Major depressive disorder, single episode, unspecified: Secondary | ICD-10-CM | POA: Diagnosis present

## 2017-09-17 DIAGNOSIS — F419 Anxiety disorder, unspecified: Secondary | ICD-10-CM | POA: Diagnosis present

## 2017-09-17 DIAGNOSIS — I2510 Atherosclerotic heart disease of native coronary artery without angina pectoris: Secondary | ICD-10-CM | POA: Diagnosis present

## 2017-09-17 DIAGNOSIS — Z4682 Encounter for fitting and adjustment of non-vascular catheter: Secondary | ICD-10-CM | POA: Diagnosis not present

## 2017-09-17 DIAGNOSIS — K56609 Unspecified intestinal obstruction, unspecified as to partial versus complete obstruction: Secondary | ICD-10-CM | POA: Diagnosis not present

## 2017-09-17 DIAGNOSIS — G8929 Other chronic pain: Secondary | ICD-10-CM | POA: Diagnosis present

## 2017-09-17 DIAGNOSIS — E869 Volume depletion, unspecified: Secondary | ICD-10-CM | POA: Diagnosis not present

## 2017-09-17 DIAGNOSIS — K633 Ulcer of intestine: Secondary | ICD-10-CM | POA: Diagnosis not present

## 2017-09-17 DIAGNOSIS — N179 Acute kidney failure, unspecified: Secondary | ICD-10-CM | POA: Diagnosis present

## 2017-09-17 DIAGNOSIS — Z95810 Presence of automatic (implantable) cardiac defibrillator: Secondary | ICD-10-CM | POA: Diagnosis not present

## 2017-09-17 DIAGNOSIS — N390 Urinary tract infection, site not specified: Secondary | ICD-10-CM | POA: Diagnosis not present

## 2017-09-17 DIAGNOSIS — I129 Hypertensive chronic kidney disease with stage 1 through stage 4 chronic kidney disease, or unspecified chronic kidney disease: Secondary | ICD-10-CM | POA: Diagnosis not present

## 2017-09-17 DIAGNOSIS — K5651 Intestinal adhesions [bands], with partial obstruction: Secondary | ICD-10-CM | POA: Diagnosis not present

## 2017-09-17 DIAGNOSIS — I509 Heart failure, unspecified: Secondary | ICD-10-CM | POA: Diagnosis present

## 2017-09-17 DIAGNOSIS — E860 Dehydration: Secondary | ICD-10-CM | POA: Diagnosis not present

## 2017-09-17 DIAGNOSIS — I252 Old myocardial infarction: Secondary | ICD-10-CM | POA: Diagnosis not present

## 2017-09-17 DIAGNOSIS — B952 Enterococcus as the cause of diseases classified elsewhere: Secondary | ICD-10-CM | POA: Diagnosis not present

## 2017-09-17 DIAGNOSIS — E039 Hypothyroidism, unspecified: Secondary | ICD-10-CM | POA: Diagnosis present

## 2017-09-17 DIAGNOSIS — I130 Hypertensive heart and chronic kidney disease with heart failure and stage 1 through stage 4 chronic kidney disease, or unspecified chronic kidney disease: Secondary | ICD-10-CM | POA: Diagnosis present

## 2017-09-17 DIAGNOSIS — N183 Chronic kidney disease, stage 3 (moderate): Secondary | ICD-10-CM | POA: Diagnosis present

## 2017-09-17 DIAGNOSIS — E785 Hyperlipidemia, unspecified: Secondary | ICD-10-CM | POA: Diagnosis present

## 2017-09-17 DIAGNOSIS — D62 Acute posthemorrhagic anemia: Secondary | ICD-10-CM | POA: Diagnosis not present

## 2017-09-17 DIAGNOSIS — K529 Noninfective gastroenteritis and colitis, unspecified: Secondary | ICD-10-CM | POA: Diagnosis not present

## 2017-09-17 DIAGNOSIS — K913 Postprocedural intestinal obstruction, unspecified as to partial versus complete: Secondary | ICD-10-CM | POA: Diagnosis present

## 2017-09-17 DIAGNOSIS — Z6832 Body mass index (BMI) 32.0-32.9, adult: Secondary | ICD-10-CM | POA: Diagnosis not present

## 2017-09-17 DIAGNOSIS — K219 Gastro-esophageal reflux disease without esophagitis: Secondary | ICD-10-CM | POA: Diagnosis present

## 2017-09-17 DIAGNOSIS — Z452 Encounter for adjustment and management of vascular access device: Secondary | ICD-10-CM | POA: Diagnosis not present

## 2017-09-17 DIAGNOSIS — E669 Obesity, unspecified: Secondary | ICD-10-CM | POA: Diagnosis present

## 2017-09-17 DIAGNOSIS — I959 Hypotension, unspecified: Secondary | ICD-10-CM | POA: Diagnosis not present

## 2017-09-17 DIAGNOSIS — K565 Intestinal adhesions [bands], unspecified as to partial versus complete obstruction: Secondary | ICD-10-CM | POA: Diagnosis not present

## 2017-09-17 DIAGNOSIS — Y832 Surgical operation with anastomosis, bypass or graft as the cause of abnormal reaction of the patient, or of later complication, without mention of misadventure at the time of the procedure: Secondary | ICD-10-CM | POA: Diagnosis present

## 2017-09-17 DIAGNOSIS — G4700 Insomnia, unspecified: Secondary | ICD-10-CM | POA: Diagnosis present

## 2017-09-17 LAB — BASIC METABOLIC PANEL
Anion gap: 9 (ref 5–15)
BUN: 38 mg/dL — AB (ref 6–20)
CALCIUM: 8.2 mg/dL — AB (ref 8.9–10.3)
CO2: 24 mmol/L (ref 22–32)
CREATININE: 2.12 mg/dL — AB (ref 0.44–1.00)
Chloride: 103 mmol/L (ref 101–111)
GFR calc Af Amer: 27 mL/min — ABNORMAL LOW (ref >60)
GFR, EST NON AFRICAN AMERICAN: 23 mL/min — AB (ref >60)
Glucose, Bld: 73 mg/dL (ref 65–99)
Potassium: 4 mmol/L (ref 3.5–5.1)
Sodium: 136 mmol/L (ref 135–145)

## 2017-09-17 LAB — GLUCOSE, CAPILLARY
GLUCOSE-CAPILLARY: 55 mg/dL — AB (ref 65–99)
GLUCOSE-CAPILLARY: 66 mg/dL (ref 65–99)
GLUCOSE-CAPILLARY: 91 mg/dL (ref 65–99)
GLUCOSE-CAPILLARY: 95 mg/dL (ref 65–99)
GLUCOSE-CAPILLARY: 83 mg/dL (ref 65–99)
Glucose-Capillary: 102 mg/dL — ABNORMAL HIGH (ref 65–99)
Glucose-Capillary: 66 mg/dL (ref 65–99)
Glucose-Capillary: 70 mg/dL (ref 65–99)
Glucose-Capillary: 97 mg/dL (ref 65–99)

## 2017-09-17 MED ORDER — DEXTROSE 50 % IV SOLN
INTRAVENOUS | Status: AC
  Administered 2017-09-17: 25 mL
  Filled 2017-09-17: qty 50

## 2017-09-17 NOTE — Progress Notes (Signed)
During shift change RN noticed that patient's NG tube was out. Per patient, ", it just fell out."  NGT has been clamped since this morning.

## 2017-09-17 NOTE — Progress Notes (Signed)
Hypoglycemic Event  CBG: 55  Treatment:D % 25Ml  Symptoms: non symptomatic  Follow-up CBG: 26mintesCBG Result:102  Possible Reasons for Event: Pt is NPO  Comments/MD notified:none    Crystal [name] Crystal [name]

## 2017-09-17 NOTE — Progress Notes (Signed)
Hypoglycemic Event  CBG: 66  Treatment: 4 oz of Apple juice  Symptoms: None  Follow-up CBG: Time: 0848 CBG Result:66  Possible Reasons for Event: Inadequate meal intake  Comments/MD notified: Notified Claiborne Billings, PA    Mayra Neer D

## 2017-09-17 NOTE — Progress Notes (Signed)
[facility] Progress Note  Subjective: creat down 2.2 today, good UOP, pt up on side of bed, no c/o  Vitals:   09/16/17 0551 09/16/17 1350 09/16/17 2122 09/17/17 0527  BP: (!) 88/48 (!) 94/38 (!) 113/49 (!) 106/43  Pulse: 81 77 73 90  Resp: 18 20 20 18   Temp: 99 F (37.2 C) 98 F (36.7 C) 97.7 F (36.5 C) 98.8 F (37.1 C)  TempSrc: Oral Oral Oral Oral  SpO2: 96% 97% 100% 100%  Weight:      Height:        Inpatient medications: . dextrose  50 mL Intravenous Once  . heparin  5,000 Units Subcutaneous Q8H  . insulin aspart  0-20 Units Subcutaneous TID WC  . levothyroxine  50 mcg Intravenous Daily  . pantoprazole (PROTONIX) IV  40 mg Intravenous QHS   . dextrose 5 % and 0.45% NaCl 125 mL/hr at 09/17/17 1610   diphenhydrAMINE **OR** diphenhydrAMINE, hydrALAZINE, morphine injection, ondansetron **OR** ondansetron (ZOFRAN) IV  Exam: Gen pleasant obese WF, no distress, lying flat, NG tube in No rash, cyanosis or gangrene Sclera anicteric, throat clear  No jvd or bruits Chest clear bilat to bases RRR no MRG Abd soft ntnd no mass or ascites +bs obese GU foley cath draining dark amber urine, about 400 cc in the bag MS no joint effusions or deformity Ext no LE or UE edema, no wounds or ulcers Neuro is alert, Ox 3 , nf  AKI  Jul 2018 peak Cr 2.9 > 1.3 AKI Aug 10 2017 peak Cr 6.0 > 1.6  AKI Aug 22 2017 peak Cr 4.4 > 1.2  Home meds: -telmisartan 20 qd/ coreg 12.5 bid -statin/ insulin/ uloric/ T4 / singulair -lexapro/ restoril/ ultram/ neurontin   Impression: 1  Recurrent acute on chronic renal failure - improving, due to vol depletion/ ARB use. Would recommend avoiding ARB/ ACEi use in the future. No further suggestions. Will sign off.  2  CKD III - baseline creat 1.3- 1.6 3  Colonic obstruction - NG in placed 4  HTN - bp's soft, BP meds holding.   5  Obesity 6  DM on insulin 7  Hyperkalemia - resolved    Plan - as above   Kelly Splinter  MD [facility] pager [phone]   09/17/2017, 1:50 PM   Recent Labs  Lab 09/16/17 0524 09/16/17 1217 09/17/17 0505  NA 136 137 136  K 6.0* 4.5 4.0  CL 94* 93* 103  CO2 27 25 24   GLUCOSE 118* 71 73  BUN 48* 50* 38*  CREATININE 3.72* 3.48* 2.12*  CALCIUM 9.5 8.5* 8.2*   Recent Labs  Lab 09/15/17 1629  AST 19  ALT 16  ALKPHOS 104  BILITOT 1.1  PROT 8.5*  ALBUMIN 4.2   Recent Labs  Lab 09/15/17 1629 09/16/17 0524  WBC 13.1* 7.4  NEUTROABS 10.3*  --   HGB 13.6 12.0  HCT 41.6 37.8  MCV 82.9 84.0  PLT 345 322   Iron/TIBC/Ferritin/ %Sat No results found for: IRON, TIBC, FERRITIN, IRONPCTSAT

## 2017-09-17 NOTE — Progress Notes (Signed)
[facility] Progress Note     Subjective: CC: Gas pains Abdominal distention is improving. Patient still reports occasional gas pains. Had a BM yesterday. Nausea improved. UOP improved. BP better.   Objective: Vital signs in last 24 hours: Temp:  [97.7 F (36.5 C)-98.8 F (37.1 C)] 98.8 F (37.1 C) (02/24 0527) Pulse Rate:  [73-90] 90 (02/24 0527) Resp:  [18-20] 18 (02/24 0527) BP: (94-113)/(38-49) 106/43 (02/24 0527) SpO2:  [97 %-100 %] 100 % (02/24 0527) Last BM Date: 09/16/17  Intake/Output from previous day: 02/23 0701 - 02/24 0700 In: 1674.6 [I.V.:1614.6; NG/GT:60] Out: 3401 [Urine:2601; Emesis/NG output:800] Intake/Output this shift: No intake/output data recorded.  PE: Gen:  Alert, NAD, pleasant Card:  Regular rate and rhythm, pedal pulses 2+ BL Pulm:  Normal effort, clear to auscultation bilaterally Abd: Soft, minimally tender, distended, bowel sounds present, no HSM, incisions well healing Skin: warm and dry, no rashes  Psych: A&Ox3   Lab Results:  Recent Labs    09/15/17 1629 09/16/17 0524  WBC 13.1* 7.4  HGB 13.6 12.0  HCT 41.6 37.8  PLT 345 322   BMET Recent Labs    09/16/17 1217 09/17/17 0505  NA 137 136  K 4.5 4.0  CL 93* 103  CO2 25 24  GLUCOSE 71 73  BUN 50* 38*  CREATININE 3.48* 2.12*  CALCIUM 8.5* 8.2*   PT/INR No results for input(s): LABPROT, INR in the last 72 hours. CMP     Component Value Date/Time   NA 136 09/17/2017 0505   NA 137 02/18/2016   K 4.0 09/17/2017 0505   CL 103 09/17/2017 0505   CO2 24 09/17/2017 0505   GLUCOSE 73 09/17/2017 0505   BUN 38 (H) 09/17/2017 0505   BUN 36 (A) 07/26/2016   CREATININE 2.12 (H) 09/17/2017 0505   CREATININE 3.10 (H) 08/17/2017 1545   CALCIUM 8.2 (L) 09/17/2017 0505   PROT 8.5 (H) 09/15/2017 1629   ALBUMIN 4.2 09/15/2017 1629   AST 19 09/15/2017 1629   ALT 16 09/15/2017 1629   ALKPHOS 104 09/15/2017 1629   BILITOT 1.1 09/15/2017 1629   GFRNONAA 23 (L) 09/17/2017  0505   GFRNONAA 15 (L) 08/17/2017 1545   GFRAA 27 (L) 09/17/2017 0505   GFRAA 17 (L) 08/17/2017 1545   Lipase     Component Value Date/Time   LIPASE 35 09/15/2017 1629       Studies/Results: Dg Abdomen Acute W/chest  Result Date: 09/15/2017 CLINICAL DATA:  Abdominal pain.  Colostomy reversal in December EXAM: DG ABDOMEN ACUTE W/ 1V CHEST COMPARISON:  None. FINDINGS: Multiple dilated loops of small bowel with air-fluid levels concerning for bowel obstruction. No radiopaque calculi or other significant radiographic abnormality is seen. Heart size and mediastinal contours are within normal limits. Both lungs are clear. Dual lead cardiac pacemaker. IMPRESSION: Multiple dilated loops of small bowel with air-fluid levels concerning for bowel obstruction. Electronically Signed   By: Kathreen Devoid   On: 09/15/2017 16:31   Dg Abd Portable 1 View  Result Date: 09/15/2017 CLINICAL DATA:  NG tube placement EXAM: PORTABLE ABDOMEN - 1 VIEW COMPARISON:  09/15/2017 at 1607 hours FINDINGS: Enteric tube terminates in the proximal gastric body. Mildly prominent loop of small bowel in the right mid abdomen, incompletely visualized. ICD leads, incompletely visualized. IMPRESSION: Enteric tube terminates in the proximal gastric body. Electronically Signed   By: Julian Hy M.D.   On: 09/15/2017 17:27    Anti-infectives: Anti-infectives (From admission, onward)   None  Assessment/Plan Asthma Depression DM Hx of CAD Hx of CHF- ECHO 03/31/16 showed45% - 50% Pacemaker in place CKD stage III  Abdominal pain, nausea, vomiting Colonic obstruction -NGT with 800 cc out - continue on LIWS - passing flatus and having BMs - likely will need end stoma early next week Hyperkalemia - K 4.0 this AM, continue to monitor AKI - Cr 2.12 improving, UOP improved - continue IVF at 150 cc/h  - appreciate nephrology recs  FEN: sips of clears and ice chips, strict NPO after MN, IVF; NGT clamp as  tolerated VTE: SCDs, SQ heparin - hold tomorrow AM ID:no current abx    LOS: 0 days    Crystal [name] , [facility] 09/17/2017, 8:09 AM Pager: [phone] Consults: [phone] Mon-Fri 7:00 am-4:30 pm Sat-Sun 7:00 am-11:30 am

## 2017-09-18 ENCOUNTER — Encounter (HOSPITAL_COMMUNITY): Admission: EM | Disposition: A | Discharge status: Home / Self Care | Payer: Self-pay | Source: Home / Self Care

## 2017-09-18 ENCOUNTER — Inpatient Hospital Stay (HOSPITAL_COMMUNITY): Payer: Medicare Other | Admitting: Anesthesiology

## 2017-09-18 ENCOUNTER — Inpatient Hospital Stay (HOSPITAL_COMMUNITY): Payer: Medicare Other

## 2017-09-18 ENCOUNTER — Encounter (HOSPITAL_COMMUNITY): Payer: Self-pay | Admitting: Orthopedic Surgery

## 2017-09-18 HISTORY — PX: LYSIS OF ADHESION: SHX5961

## 2017-09-18 HISTORY — PX: PARTIAL COLECTOMY: SHX5273

## 2017-09-18 HISTORY — PX: LAPAROTOMY: SHX154

## 2017-09-18 HISTORY — PX: SIGMOIDOSCOPY: SHX6686

## 2017-09-18 LAB — CBC
HEMATOCRIT: 31.4 % — AB (ref 36.0–46.0)
Hemoglobin: 9.7 g/dL — ABNORMAL LOW (ref 12.0–15.0)
MCH: 25.8 pg — AB (ref 26.0–34.0)
MCHC: 30.9 g/dL (ref 30.0–36.0)
MCV: 83.5 fL (ref 78.0–100.0)
PLATELETS: 205 10*3/uL (ref 150–400)
RBC: 3.76 MIL/uL — AB (ref 3.87–5.11)
RDW: 14.8 % (ref 11.5–15.5)
WBC: 5.8 10*3/uL (ref 4.0–10.5)

## 2017-09-18 LAB — BASIC METABOLIC PANEL
Anion gap: 10 (ref 5–15)
BUN: 15 mg/dL (ref 6–20)
CO2: 25 mmol/L (ref 22–32)
CREATININE: 1.31 mg/dL — AB (ref 0.44–1.00)
Calcium: 8.7 mg/dL — ABNORMAL LOW (ref 8.9–10.3)
Chloride: 103 mmol/L (ref 101–111)
GFR calc Af Amer: 48 mL/min — ABNORMAL LOW (ref >60)
GFR calc non Af Amer: 41 mL/min — ABNORMAL LOW (ref >60)
Glucose, Bld: 122 mg/dL — ABNORMAL HIGH (ref 65–99)
POTASSIUM: 4 mmol/L (ref 3.5–5.1)
Sodium: 138 mmol/L (ref 135–145)

## 2017-09-18 LAB — POCT I-STAT 4, (NA,K, GLUC, HGB,HCT)
Glucose, Bld: 152 mg/dL — ABNORMAL HIGH (ref 65–99)
HCT: 30 % — ABNORMAL LOW (ref 36.0–46.0)
Hemoglobin: 10.2 g/dL — ABNORMAL LOW (ref 12.0–15.0)
Potassium: 3.8 mmol/L (ref 3.5–5.1)
Sodium: 138 mmol/L (ref 135–145)

## 2017-09-18 LAB — SURGICAL PCR SCREEN
MRSA, PCR: NEGATIVE
Staphylococcus aureus: POSITIVE — AB

## 2017-09-18 LAB — GLUCOSE, CAPILLARY
GLUCOSE-CAPILLARY: 129 mg/dL — AB (ref 65–99)
GLUCOSE-CAPILLARY: 122 mg/dL — AB (ref 65–99)
GLUCOSE-CAPILLARY: 118 mg/dL — AB (ref 65–99)
GLUCOSE-CAPILLARY: 133 mg/dL — AB (ref 65–99)
Glucose-Capillary: 229 mg/dL — ABNORMAL HIGH (ref 65–99)

## 2017-09-18 SURGERY — LAPAROTOMY, EXPLORATORY
Anesthesia: General | Site: Rectum

## 2017-09-18 MED ORDER — ONDANSETRON HCL 4 MG/2ML IJ SOLN
INTRAMUSCULAR | Status: AC
  Filled 2017-09-18: qty 2

## 2017-09-18 MED ORDER — SUGAMMADEX SODIUM 200 MG/2ML IV SOLN
INTRAVENOUS | Status: DC | PRN
  Administered 2017-09-18: 200 mg via INTRAVENOUS

## 2017-09-18 MED ORDER — 0.9 % SODIUM CHLORIDE (POUR BTL) OPTIME
TOPICAL | Status: DC | PRN
  Administered 2017-09-18 (×3): 1000 mL

## 2017-09-18 MED ORDER — SCOPOLAMINE 1 MG/3DAYS TD PT72
MEDICATED_PATCH | TRANSDERMAL | Status: AC
  Filled 2017-09-18: qty 2

## 2017-09-18 MED ORDER — LACTATED RINGERS IV SOLN
INTRAVENOUS | Status: DC | PRN
  Administered 2017-09-18: 16:00:00 via INTRAVENOUS

## 2017-09-18 MED ORDER — FENTANYL CITRATE (PF) 250 MCG/5ML IJ SOLN
INTRAMUSCULAR | Status: AC
  Filled 2017-09-18: qty 5

## 2017-09-18 MED ORDER — DEXAMETHASONE SODIUM PHOSPHATE 10 MG/ML IJ SOLN
INTRAMUSCULAR | Status: AC
  Filled 2017-09-18: qty 2

## 2017-09-18 MED ORDER — KETOROLAC TROMETHAMINE 30 MG/ML IJ SOLN
INTRAMUSCULAR | Status: AC
  Filled 2017-09-18: qty 1

## 2017-09-18 MED ORDER — SODIUM CHLORIDE 0.9 % IV SOLN
2.0000 g | INTRAVENOUS | Status: AC
  Administered 2017-09-18: 2 g via INTRAVENOUS
  Filled 2017-09-18: qty 2

## 2017-09-18 MED ORDER — DIPHENHYDRAMINE HCL 12.5 MG/5ML PO ELIX: 12.5000 mg | ORAL_SOLUTION | Freq: Four times a day (QID) | ORAL | Status: DC | PRN

## 2017-09-18 MED ORDER — LIDOCAINE HCL (CARDIAC) 20 MG/ML IV SOLN
INTRAVENOUS | Status: DC | PRN
  Administered 2017-09-18: 40 mg via INTRAVENOUS

## 2017-09-18 MED ORDER — CEFOTETAN DISODIUM-DEXTROSE 2-2.08 GM-%(50ML) IV SOLR
INTRAVENOUS | Status: AC
  Filled 2017-09-18: qty 50

## 2017-09-18 MED ORDER — METOPROLOL TARTRATE 5 MG/5ML IV SOLN
INTRAVENOUS | Status: AC
  Filled 2017-09-18: qty 5

## 2017-09-18 MED ORDER — EPHEDRINE 5 MG/ML INJ
INTRAVENOUS | Status: AC
  Filled 2017-09-18: qty 30

## 2017-09-18 MED ORDER — FENTANYL CITRATE (PF) 250 MCG/5ML IJ SOLN
INTRAMUSCULAR | Status: AC
Start: 2017-09-18 — End: ?
  Filled 2017-09-18: qty 5

## 2017-09-18 MED ORDER — ROCURONIUM BROMIDE 10 MG/ML (PF) SYRINGE
PREFILLED_SYRINGE | INTRAVENOUS | Status: AC
  Filled 2017-09-18: qty 20

## 2017-09-18 MED ORDER — LIDOCAINE 2% (20 MG/ML) 5 ML SYRINGE
INTRAMUSCULAR | Status: AC
  Filled 2017-09-18: qty 20

## 2017-09-18 MED ORDER — MIDAZOLAM HCL 2 MG/2ML IJ SOLN
INTRAMUSCULAR | Status: AC
  Filled 2017-09-18: qty 2

## 2017-09-18 MED ORDER — SODIUM CHLORIDE 0.9% FLUSH: 9.0000 mL | INTRAVENOUS | Status: DC | PRN

## 2017-09-18 MED ORDER — ETOMIDATE 2 MG/ML IV SOLN
INTRAVENOUS | Status: AC
  Filled 2017-09-18: qty 10

## 2017-09-18 MED ORDER — HYDROMORPHONE HCL 1 MG/ML IJ SOLN
0.2500 mg | INTRAMUSCULAR | Status: DC | PRN
  Administered 2017-09-18 (×4): 0.5 mg via INTRAVENOUS

## 2017-09-18 MED ORDER — HYDROMORPHONE 1 MG/ML IV SOLN
INTRAVENOUS | Status: AC
  Filled 2017-09-18: qty 25

## 2017-09-18 MED ORDER — NALOXONE HCL 0.4 MG/ML IJ SOLN: 0.4000 mg | INTRAMUSCULAR | Status: DC | PRN

## 2017-09-18 MED ORDER — HYDROMORPHONE HCL 1 MG/ML IJ SOLN
INTRAMUSCULAR | Status: AC
  Filled 2017-09-18: qty 1

## 2017-09-18 MED ORDER — SURGILUBE EX GEL
CUTANEOUS | Status: DC | PRN
  Administered 2017-09-18 (×2): 1 via TOPICAL

## 2017-09-18 MED ORDER — ALVIMOPAN 12 MG PO CAPS
ORAL_CAPSULE | ORAL | Status: AC
  Administered 2017-09-18: 12 mg via ORAL
  Filled 2017-09-18: qty 1

## 2017-09-18 MED ORDER — PROPOFOL 10 MG/ML IV BOLUS
INTRAVENOUS | Status: AC
  Filled 2017-09-18: qty 20

## 2017-09-18 MED ORDER — ONDANSETRON HCL 4 MG/2ML IJ SOLN
INTRAMUSCULAR | Status: AC
  Filled 2017-09-18: qty 4

## 2017-09-18 MED ORDER — ROCURONIUM BROMIDE 100 MG/10ML IV SOLN
INTRAVENOUS | Status: DC | PRN
  Administered 2017-09-18: 10 mg via INTRAVENOUS
  Administered 2017-09-18: 50 mg via INTRAVENOUS
  Administered 2017-09-18: 10 mg via INTRAVENOUS
  Administered 2017-09-18: 20 mg via INTRAVENOUS

## 2017-09-18 MED ORDER — ALVIMOPAN 12 MG PO CAPS
12.0000 mg | ORAL_CAPSULE | ORAL | Status: AC
  Administered 2017-09-18: 12 mg via ORAL

## 2017-09-18 MED ORDER — SODIUM CHLORIDE 0.9 % IV SOLN: Freq: Once | INTRAVENOUS | Status: DC

## 2017-09-18 MED ORDER — SUGAMMADEX SODIUM 200 MG/2ML IV SOLN
INTRAVENOUS | Status: AC
  Filled 2017-09-18: qty 2

## 2017-09-18 MED ORDER — SUCCINYLCHOLINE CHLORIDE 20 MG/ML IJ SOLN
INTRAMUSCULAR | Status: DC | PRN
  Administered 2017-09-18: 140 mg via INTRAVENOUS

## 2017-09-18 MED ORDER — LACTATED RINGERS IV SOLN
INTRAVENOUS | Status: DC | PRN
  Administered 2017-09-18: 14:00:00 via INTRAVENOUS

## 2017-09-18 MED ORDER — ARTIFICIAL TEARS OPHTHALMIC OINT
TOPICAL_OINTMENT | OPHTHALMIC | Status: AC
  Filled 2017-09-18: qty 7

## 2017-09-18 MED ORDER — SCOPOLAMINE 1 MG/3DAYS TD PT72
MEDICATED_PATCH | TRANSDERMAL | Status: DC | PRN
  Administered 2017-09-18: 1 via TRANSDERMAL

## 2017-09-18 MED ORDER — PHENYLEPHRINE 40 MCG/ML (10ML) SYRINGE FOR IV PUSH (FOR BLOOD PRESSURE SUPPORT)
PREFILLED_SYRINGE | INTRAVENOUS | Status: AC
  Filled 2017-09-18: qty 40

## 2017-09-18 MED ORDER — METOPROLOL TARTRATE 5 MG/5ML IV SOLN
INTRAVENOUS | Status: DC | PRN
  Administered 2017-09-18 (×2): 2.5 mg via INTRAVENOUS

## 2017-09-18 MED ORDER — PROPOFOL 10 MG/ML IV BOLUS
INTRAVENOUS | Status: DC | PRN
  Administered 2017-09-18: 150 mg via INTRAVENOUS

## 2017-09-18 MED ORDER — SUCCINYLCHOLINE CHLORIDE 200 MG/10ML IV SOSY
PREFILLED_SYRINGE | INTRAVENOUS | Status: AC
  Filled 2017-09-18: qty 20

## 2017-09-18 MED ORDER — DIPHENHYDRAMINE HCL 50 MG/ML IJ SOLN: 12.5000 mg | Freq: Four times a day (QID) | INTRAMUSCULAR | Status: DC | PRN

## 2017-09-18 MED ORDER — HYDROMORPHONE 1 MG/ML IV SOLN
INTRAVENOUS | Status: DC
  Administered 2017-09-18: 20:00:00 via INTRAVENOUS
  Administered 2017-09-18: 1 mg via INTRAVENOUS
  Administered 2017-09-19: 2.4 mg via INTRAVENOUS
  Administered 2017-09-19: 0.4 mg via INTRAVENOUS
  Administered 2017-09-19: 0.8 mg via INTRAVENOUS
  Administered 2017-09-19 (×2): 1.4 mg via INTRAVENOUS
  Administered 2017-09-20: 0.4 mg via INTRAVENOUS
  Administered 2017-09-20 (×2): 2.2 mg via INTRAVENOUS
  Administered 2017-09-20: 1 mg via INTRAVENOUS
  Administered 2017-09-20: 0.8 mg via INTRAVENOUS
  Administered 2017-09-20: 0.4 mg via INTRAVENOUS
  Administered 2017-09-20: 0.2 mg via INTRAVENOUS
  Administered 2017-09-21: 19:00:00 via INTRAVENOUS
  Administered 2017-09-21: 1.2 mg via INTRAVENOUS
  Administered 2017-09-21: 1.4 mg via INTRAVENOUS
  Administered 2017-09-21: 2.4 mg via INTRAVENOUS
  Administered 2017-09-21: 0.6 mg via INTRAVENOUS
  Administered 2017-09-22: 1.2 mg via INTRAVENOUS
  Administered 2017-09-22 (×2): 0 mg via INTRAVENOUS
  Administered 2017-09-22: 0.6 mg via INTRAVENOUS
  Administered 2017-09-22: 2.2 mg via INTRAVENOUS
  Administered 2017-09-22: 1 mg via INTRAVENOUS
  Administered 2017-09-22: 2.37 mg via INTRAVENOUS
  Administered 2017-09-23: 0.4 mg via INTRAVENOUS
  Filled 2017-09-18: qty 25

## 2017-09-18 MED ORDER — ONDANSETRON HCL 4 MG/2ML IJ SOLN: 4.0000 mg | Freq: Four times a day (QID) | INTRAMUSCULAR | Status: DC | PRN

## 2017-09-18 MED ORDER — FENTANYL CITRATE (PF) 100 MCG/2ML IJ SOLN
INTRAMUSCULAR | Status: DC | PRN
  Administered 2017-09-18: 50 ug via INTRAVENOUS
  Administered 2017-09-18 (×2): 100 ug via INTRAVENOUS
  Administered 2017-09-18 (×3): 50 ug via INTRAVENOUS

## 2017-09-18 SURGICAL SUPPLY — 63 items
CANISTER SUCT 3000ML PPV (MISCELLANEOUS) ×4 IMPLANT
CHLORAPREP W/TINT 26ML (MISCELLANEOUS) ×4 IMPLANT
COUNTER NEEDLE 20 DBL MAG RED (NEEDLE) ×4 IMPLANT
COVER MAYO STAND STRL (DRAPES) ×8 IMPLANT
COVER SURGICAL LIGHT HANDLE (MISCELLANEOUS) ×8 IMPLANT
DRAPE HALF SHEET 40X57 (DRAPES) ×8 IMPLANT
DRAPE LAPAROSCOPIC ABDOMINAL (DRAPES) ×4 IMPLANT
DRAPE UTILITY XL STRL (DRAPES) ×4 IMPLANT
DRAPE WARM FLUID 44X44 (DRAPE) ×4 IMPLANT
ELECT BLADE 6.5 EXT (BLADE) ×4 IMPLANT
ELECT CAUTERY BLADE 6.4 (BLADE) ×8 IMPLANT
ELECT REM PT RETURN 9FT ADLT (ELECTROSURGICAL) ×4
ELECTRODE REM PT RTRN 9FT ADLT (ELECTROSURGICAL) ×2 IMPLANT
GLOVE BIO SURGEON STRL SZ 6 (GLOVE) ×8 IMPLANT
GLOVE BIO SURGEON STRL SZ8 (GLOVE) ×4 IMPLANT
GLOVE BIOGEL PI IND STRL 6.5 (GLOVE) ×6 IMPLANT
GLOVE BIOGEL PI IND STRL 8 (GLOVE) ×6 IMPLANT
GLOVE BIOGEL PI IND STRL 8.5 (GLOVE) ×2 IMPLANT
GLOVE BIOGEL PI INDICATOR 6.5 (GLOVE) ×6
GLOVE BIOGEL PI INDICATOR 8 (GLOVE) ×6
GLOVE BIOGEL PI INDICATOR 8.5 (GLOVE) ×2
GLOVE ECLIPSE 7.5 STRL STRAW (GLOVE) ×8 IMPLANT
GLOVE ECLIPSE 8.0 STRL XLNG CF (GLOVE) ×4 IMPLANT
GLOVE SURG SS PI 6.0 STRL IVOR (GLOVE) ×4 IMPLANT
GLOVE SURG SS PI 6.5 STRL IVOR (GLOVE) ×8 IMPLANT
GOWN STRL REUS W/ TWL LRG LVL3 (GOWN DISPOSABLE) ×10 IMPLANT
GOWN STRL REUS W/ TWL XL LVL3 (GOWN DISPOSABLE) ×4 IMPLANT
GOWN STRL REUS W/TWL LRG LVL3 (GOWN DISPOSABLE) ×10
GOWN STRL REUS W/TWL XL LVL3 (GOWN DISPOSABLE) ×4
KIT BASIN OR (CUSTOM PROCEDURE TRAY) ×4 IMPLANT
KIT PREVENA INCISION MGT20CM45 (CANNISTER) ×4 IMPLANT
KIT TURNOVER KIT B (KITS) ×4 IMPLANT
LEGGING LITHOTOMY PAIR STRL (DRAPES) ×4 IMPLANT
LIGASURE IMPACT 36 18CM CVD LR (INSTRUMENTS) ×4 IMPLANT
NS IRRIG 1000ML POUR BTL (IV SOLUTION) ×12 IMPLANT
PACK GENERAL/GYN (CUSTOM PROCEDURE TRAY) ×4 IMPLANT
PAD ARMBOARD 7.5X6 YLW CONV (MISCELLANEOUS) ×4 IMPLANT
PENCIL BUTTON HOLSTER BLD 10FT (ELECTRODE) ×4 IMPLANT
RELOAD PROXIMATE 75MM BLUE (ENDOMECHANICALS) ×4 IMPLANT
SEPRAFILM PROCEDURAL PACK 3X5 (MISCELLANEOUS) IMPLANT
SOLUTION BETADINE 4OZ (MISCELLANEOUS) ×4 IMPLANT
SPECIMEN JAR LARGE (MISCELLANEOUS) IMPLANT
SPECIMEN JAR X LARGE (MISCELLANEOUS) ×4 IMPLANT
SPONGE LAP 18X18 5 PK (GAUZE/BANDAGES/DRESSINGS) ×16 IMPLANT
SPONGE LAP 18X18 X RAY DECT (DISPOSABLE) ×8 IMPLANT
STAPLER PROXIMATE 75MM BLUE (STAPLE) ×4 IMPLANT
STAPLER VISISTAT 35W (STAPLE) ×4 IMPLANT
SUCTION POOLE TIP (SUCTIONS) ×4 IMPLANT
SURGILUBE 2OZ TUBE FLIPTOP (MISCELLANEOUS) ×8 IMPLANT
SUT NOVA 1 T20/GS 25DT (SUTURE) ×8 IMPLANT
SUT PDS AB 1 TP1 96 (SUTURE) ×8 IMPLANT
SUT PROLENE 2 0 CT2 30 (SUTURE) IMPLANT
SUT PROLENE 2 0 KS (SUTURE) IMPLANT
SUT SILK 2 0 SH CR/8 (SUTURE) ×4 IMPLANT
SUT SILK 2 0 TIES 10X30 (SUTURE) ×4 IMPLANT
SUT SILK 3 0 SH CR/8 (SUTURE) ×24 IMPLANT
SUT SILK 3 0 TIES 10X30 (SUTURE) ×4 IMPLANT
SYR BULB IRRIGATION 50ML (SYRINGE) ×4 IMPLANT
TOWEL GREEN STERILE FF (TOWEL DISPOSABLE) ×4 IMPLANT
TRAY PROCTOSCOPIC FIBER OPTIC (SET/KITS/TRAYS/PACK) ×4 IMPLANT
TUBE CONNECTING 12'X1/4 (SUCTIONS) ×1
TUBE CONNECTING 12X1/4 (SUCTIONS) ×3 IMPLANT
YANKAUER SUCT BULB TIP NO VENT (SUCTIONS) ×4 IMPLANT

## 2017-09-18 NOTE — Anesthesia Procedure Notes (Signed)
Procedure Name: Intubation Date/Time: 09/18/2017 3:24 PM Performed by: Shirlyn Goltz, CRNA Pre-anesthesia Checklist: Patient identified, Emergency Drugs available, Suction available and Patient being monitored Patient Re-evaluated:Patient Re-evaluated prior to induction Oxygen Delivery Method: Circle system utilized Preoxygenation: Pre-oxygenation with 100% oxygen Induction Type: IV induction, Rapid sequence and Cricoid Pressure applied Laryngoscope Size: Miller and 2 Grade View: Grade I Tube type: Oral Tube size: 7.0 mm Number of attempts: 1 Airway Equipment and Method: Stylet Placement Confirmation: ETT inserted through vocal cords under direct vision,  positive ETCO2 and breath sounds checked- equal and bilateral Secured at: 21 cm Tube secured with: Tape Dental Injury: Teeth and Oropharynx as per pre-operative assessment

## 2017-09-18 NOTE — Progress Notes (Signed)
[facility] Progress Note    Subjective: Patient sitting up in chair, NGT in place but not draining a lot.  No distress.  Objective: Vital signs in last 24 hours: Temp:  [98.9 F (37.2 C)-99.9 F (37.7 C)] 99.1 F (37.3 C) (02/25 0504) Pulse Rate:  [96-98] 96 (02/25 0504) Resp:  [16] 16 (02/25 0504) BP: (120-141)/(49-59) 129/52 (02/25 0504) SpO2:  [94 %-99 %] 94 % (02/25 0504) Weight:  [88.5 kg (195 lb 1.7 oz)] 88.5 kg (195 lb 1.7 oz) (02/25 0504) Last BM Date: 09/16/17  Intake/Output from previous day: 02/24 0701 - 02/25 0700 In: -  Out: 4300 [Urine:4300] Intake/Output this shift: No intake/output data recorded.  General: No acute distress  Lungs: Clear  Abd: Soft, not tender.  Has bowel sounds.  Extremities: No changes  Neuro: Intact  Lab Results:  @LABLAST2 (wbc:2,hgb:2,hct:2,plt:2) BMET ) Recent Labs    09/17/17 0505 09/18/17 0711  NA 136 138  K 4.0 4.0  CL 103 103  CO2 24 25  GLUCOSE 73 122*  BUN 38* 15  CREATININE 2.12* 1.31*  CALCIUM 8.2* 8.7*   PT/INR No results for input(s): LABPROT, INR in the last 72 hours. ABG No results for input(s): PHART, HCO3 in the last 72 hours.  Invalid input(s): PCO2, PO2  Studies/Results: Dg Abd Portable 1v  Result Date: 09/18/2017 CLINICAL DATA:  NG tube placement EXAM: PORTABLE ABDOMEN - 1 VIEW COMPARISON:  09/15/2017 FINDINGS: NG tube tip is in the proximal stomach just beyond the GE junction. Gas within mildly prominent large and small bowel loops. IMPRESSION: NG tube tip in the proximal stomach. Electronically Signed   By: Rolm Baptise M.D.   On: 09/18/2017 01:13    Anti-infectives: Anti-infectives (From admission, onward)   Start     Dose/Rate Route Frequency Ordered Stop   09/18/17 0915  cefoTEtan (CEFOTAN) 2 g in sodium chloride 0.9 % 100 mL IVPB     2 g 200 mL/hr over 30 Minutes Intravenous On call to O.R. 09/18/17 5621 09/19/17 0559      Assessment/Plan: s/p Procedure(s): EXPLORATORY  LAPAROTOMY PARTIAL COLECTOMY Unresolved colonic anastomotic obstruction.  Will explore today and try to resect the strictured area and perform ileo-colonic anastomosis to the recto sigmoid colon.  If thisis not possible will perform colostomy  LOS: 1 day   Kathryne Eriksson. [name], MD, FACS [phone] [facility] 09/18/2017

## 2017-09-18 NOTE — Anesthesia Postprocedure Evaluation (Signed)
Anesthesia Post Note  Patient: Crystal [name]  Procedure(s) Performed: EXPLORATORY LAPAROTOMY (N/A Abdomen) ILEOCOLECTOMY (N/A Abdomen) SIGMOIDOSCOPY (N/A Rectum) LYSIS OF ADHESION (N/A Abdomen)     Patient location during evaluation: PACU Anesthesia Type: General Level of consciousness: sedated Pain management: pain level controlled Vital Signs Assessment: post-procedure vital signs reviewed and stable Respiratory status: spontaneous breathing and respiratory function stable Cardiovascular status: stable Postop Assessment: no apparent nausea or vomiting Anesthetic complications: no    Last Vitals:  Vitals:   09/18/17 2016 09/18/17 2037  BP:  (!) 128/46  Pulse: (!) 101 (!) 101  Resp: 13 14  Temp:  37.1 C  SpO2: 97% 94%    Last Pain:  Vitals:   09/18/17 2037  TempSrc: Oral  PainSc:                  [name] DANIEL

## 2017-09-18 NOTE — Op Note (Signed)
OPERATIVE REPORT  DATE OF OPERATION: 09/18/2017  PATIENT:  Crystal [name]  67 y.o. female  PRE-OPERATIVE DIAGNOSIS:  Colonic stricture with obstruction  POST-OPERATIVE DIAGNOSIS:  Colonic stricture with obstruction  INDICATION(S) FOR OPERATION:  Patient with her second admission after reversal of colostomy, now with obstruction at colonic anastomotic stricture, now for resection and ileorectal anastomosis  FINDINGS:  Stricture at anastomosis, but patent.  Extensive adhesions.  PROCEDURE:  Procedure(s): EXPLORATORY LAPAROTOMY ILEOCOLECTOMY SIGMOIDOSCOPY EXTENSIVE LYSIS OF ADHESION  SURGEON:  Surgeon(s): Judeth Horn, MD Georganna Skeans, MD Clovis Riley, MD  ASSISTANT: Kae Heller, MD  ANESTHESIA:   general  COMPLICATIONS:  None  EBL: 300 ml  BLOOD ADMINISTERED: none  DRAINS: Nasogastric Tube and Urinary Catheter (Foley)   SPECIMEN:  Source of Specimen:  ileum and right colon  COUNTS CORRECT:  YES  PROCEDURE DETAILS: The patient was taken to the operating room and placed on the table initially in the supine position.  After an adequate general endotracheal anesthetic was administered, she was placed in the lithotomy position.  Proper timeout was performed identifying the patient and the procedure to be performed.  We began the procedure with a proctoscopy using a rigid sigmoidoscope.  We took it from the anal verge all the way up to 22 cm and although there was a lot of stool in the rectal vault we did not reach the anastomosis or the level of the stricture.  We were able to pass it all the way up to approximately 22 cm.  We subsequently withdrew the scope and then allowed the patient to be prepped and draped in usual sterile manner including the perineum and the abdominal wall.  A midline incision was made using a #10 blade and taken down to the peritoneal layer.  There were extensive adhesions between the abdominal wall and the omentum and also loops of small bowel  obstruction throughout the entire length of the incision.  We started just above the umbilicus and then extended proximally and distally all the way down to the pubic crest.  A large amount of time (2 hours) was spent taking down adhesions from the abdominal wall to the small bowel and also between multiple loops of small bowel and also the markedly distended right colon.  This appeared to be somewhat twisted upon itself with the terminal ileum stuck to the colon wall medially as the colon went up towards the right upper quadrant and back down towards the pelvis.  These adhesions were dense and although there were 3 serosal tears that had to be repaired with 3-0 silk Lembert stitches, there were no full-thickness enterotomies made.  After the extensive lysis of adhesions which took at least 2 hours we were  able to identify the terminal ileum and also the colon-colon anastomosis.  The assistant passed a sigmoidoscope up through the anus while the surgeon palpated the scope up to just below the area of what appeared to be the colon-colon anastomotic stricture.  It was at that point that we transected the rectum using a GIA-75 stapler.  Proximally we came across the terminal ileum using the same stapler.  The mesentery was taken down using LigaSure device.  The specimen was taken off the field and later examined and found to have a stricture of the colonic anastomosis however it was patent we felt associated and able to allow passage of gas and stool.  We theorized that those twisting of the bowel and the anastomosis causing obstruction because it was clear  at the time of surgery the right colon and small bowel was obstructed.  We did a handsewn single-layer anastomosis between the distal sigmoid and rectum and the small bowel.  This is a side to end anastomosis.  It was very loose without any tension.  There was excellent passage of contents through the anastomosis and an adequate donor.  There was no  twisting of the mesentery.  We ran the small bowel all the way back towards the ligament of Treitz and took down all the adhesions that would have impeded our closure.  We subsequently irrigated with 2 L of saline solution.  The surgeon and assistant then changed gowns above and came back to close.  The midline fascia was closed using running looped #1 PDS with intervening figure-of-eight stitches of #1 Novafil for internal reinforcement.  The skin was closed using stainless steel staples.  A Prevena incisional negative pressure wound dressing was placed.  All needle counts, sponge counts, and instrument counts were correct.  PATIENT DISPOSITION:  PACU - hemodynamically stable.   Judeth Horn 2/25/20197:31 PM

## 2017-09-18 NOTE — Transfer of Care (Signed)
Immediate Anesthesia Transfer of Care Note  Patient: Crystal [name]  Procedure(s) Performed: EXPLORATORY LAPAROTOMY (N/A Abdomen) ILEOCOLECTOMY (N/A Abdomen) SIGMOIDOSCOPY (N/A Rectum) LYSIS OF ADHESION (N/A Abdomen)  Patient Location: PACU  Anesthesia Type:General  Level of Consciousness: awake, alert  and oriented  Airway & Oxygen Therapy: Patient Spontanous Breathing and Patient connected to nasal cannula oxygen  Post-op Assessment: Report given to RN and Post -op Vital signs reviewed and stable  Post vital signs: Reviewed and stable  Last Vitals:  Vitals:   09/18/17 1352 09/18/17 1923  BP: (!) 145/72 (!) 164/85  Pulse: 92 97  Resp: 16 14  Temp: 37.6 C (!) 36.1 C  SpO2: 100% 99%    Last Pain:  Vitals:   09/18/17 1352  TempSrc: Oral  PainSc:          Complications: No apparent anesthesia complications

## 2017-09-18 NOTE — Anesthesia Procedure Notes (Signed)
Central Venous Catheter Insertion Performed by: Duane Boston, MD, anesthesiologist Start/End2/25/2019 6:34 PM, 09/18/2017 6:44 PM Patient location: Pre-op. Preanesthetic checklist: patient identified, IV checked, site marked, risks and benefits discussed, surgical consent, monitors and equipment checked, pre-op evaluation, timeout performed and anesthesia consent Position: Trendelenburg Lidocaine 1% used for infiltration and patient sedated Hand hygiene performed , maximum sterile barriers used  and Seldinger technique used Catheter size: 8 Fr Total catheter length 16. Central line was placed.Triple lumen Procedure performed using ultrasound guided technique. Ultrasound Notes:anatomy identified, needle tip was noted to be adjacent to the nerve/plexus identified, no ultrasound evidence of intravascular and/or intraneural injection and image(s) printed for medical record Attempts: 1 Following insertion, dressing applied, line sutured and Biopatch. Post procedure assessment: blood return through all ports, free fluid flow and no air  Patient tolerated the procedure well with no immediate complications.

## 2017-09-18 NOTE — Progress Notes (Signed)
1356 Pt taken down to OR for surgery.

## 2017-09-18 NOTE — Anesthesia Preprocedure Evaluation (Addendum)
Anesthesia Evaluation  Patient identified by MRN, date of birth, ID band Patient awake    Reviewed: Allergy & Precautions, H&P , NPO status , Patient's Chart, lab work & pertinent test results  History of Anesthesia Complications (+) PONV  Airway Mallampati: II  TM Distance: >3 FB Neck ROM: Full    Dental no notable dental hx. (+) Teeth Intact, Dental Advisory Given   Pulmonary asthma , sleep apnea ,    Pulmonary exam normal breath sounds clear to auscultation       Cardiovascular hypertension, + CAD and +CHF  + dysrhythmias + Cardiac Defibrillator  Rhythm:Regular Rate:Normal     Neuro/Psych Anxiety Depression negative neurological ROS     GI/Hepatic Neg liver ROS, GERD  ,  Endo/Other  diabetes, Insulin DependentHypothyroidism   Renal/GU Renal InsufficiencyRenal disease  negative genitourinary   Musculoskeletal  (+) Arthritis , Osteoarthritis,    Abdominal (+) + obese,   Peds  Hematology negative hematology ROS (+) anemia ,   Anesthesia Other Findings   Reproductive/Obstetrics negative OB ROS                            Anesthesia Physical Anesthesia Plan  ASA: III  Anesthesia Plan: General   Post-op Pain Management:    Induction: Intravenous, Rapid sequence and Cricoid pressure planned  PONV Risk Score and Plan: 4 or greater and Ondansetron, Midazolam and Scopolamine patch - Pre-op  Airway Management Planned: Oral ETT  Additional Equipment:   Intra-op Plan:   Post-operative Plan: Extubation in OR  Informed Consent: I have reviewed the patients History and Physical, chart, labs and discussed the procedure including the risks, benefits and alternatives for the proposed anesthesia with the patient or authorized representative who has indicated his/her understanding and acceptance.   Dental advisory given  Plan Discussed with: CRNA  Anesthesia Plan Comments:          Anesthesia Quick Evaluation

## 2017-09-19 ENCOUNTER — Encounter (HOSPITAL_COMMUNITY): Payer: Self-pay | Admitting: General Surgery

## 2017-09-19 LAB — BASIC METABOLIC PANEL
ANION GAP: 12 (ref 5–15)
BUN: 16 mg/dL (ref 6–20)
CO2: 22 mmol/L (ref 22–32)
Calcium: 7.6 mg/dL — ABNORMAL LOW (ref 8.9–10.3)
Chloride: 104 mmol/L (ref 101–111)
Creatinine, Ser: 1.93 mg/dL — ABNORMAL HIGH (ref 0.44–1.00)
GFR calc Af Amer: 30 mL/min — ABNORMAL LOW (ref >60)
GFR, EST NON AFRICAN AMERICAN: 26 mL/min — AB (ref >60)
Glucose, Bld: 270 mg/dL — ABNORMAL HIGH (ref 65–99)
POTASSIUM: 5.2 mmol/L — AB (ref 3.5–5.1)
SODIUM: 138 mmol/L (ref 135–145)

## 2017-09-19 LAB — CBC WITH DIFFERENTIAL/PLATELET
BASOS ABS: 0 10*3/uL (ref 0.0–0.1)
Basophils Relative: 0 %
EOS ABS: 0 10*3/uL (ref 0.0–0.7)
EOS PCT: 0 %
HCT: 35.9 % — ABNORMAL LOW (ref 36.0–46.0)
Hemoglobin: 11 g/dL — ABNORMAL LOW (ref 12.0–15.0)
LYMPHS PCT: 12 %
Lymphs Abs: 1.3 10*3/uL (ref 0.7–4.0)
MCH: 25.8 pg — ABNORMAL LOW (ref 26.0–34.0)
MCHC: 30.6 g/dL (ref 30.0–36.0)
MCV: 84.3 fL (ref 78.0–100.0)
Monocytes Absolute: 1.1 10*3/uL — ABNORMAL HIGH (ref 0.1–1.0)
Monocytes Relative: 10 %
Neutro Abs: 9 10*3/uL — ABNORMAL HIGH (ref 1.7–7.7)
Neutrophils Relative %: 78 %
PLATELETS: 243 10*3/uL (ref 150–400)
RBC: 4.26 MIL/uL (ref 3.87–5.11)
RDW: 14.9 % (ref 11.5–15.5)
WBC: 11.5 10*3/uL — AB (ref 4.0–10.5)

## 2017-09-19 LAB — GLUCOSE, CAPILLARY
Glucose-Capillary: 254 mg/dL — ABNORMAL HIGH (ref 65–99)
Glucose-Capillary: 253 mg/dL — ABNORMAL HIGH (ref 65–99)
Glucose-Capillary: 145 mg/dL — ABNORMAL HIGH (ref 65–99)
Glucose-Capillary: 155 mg/dL — ABNORMAL HIGH (ref 65–99)

## 2017-09-19 MED ORDER — SODIUM CHLORIDE 0.9 % IV SOLN
Freq: Once | INTRAVENOUS | Status: AC
  Administered 2017-09-19: 18:00:00 via INTRAVENOUS

## 2017-09-19 MED ORDER — SODIUM CHLORIDE 0.9 % IV SOLN
500.0000 mL | Freq: Once | INTRAVENOUS | Status: AC
Start: 2017-09-19 — End: 2017-09-19
  Administered 2017-09-19: 500 mL via INTRAVENOUS

## 2017-09-19 MED ORDER — ALVIMOPAN 12 MG PO CAPS
12.0000 mg | ORAL_CAPSULE | Freq: Two times a day (BID) | ORAL | Status: DC
  Administered 2017-09-19 – 2017-09-22 (×7): 12 mg via ORAL
  Filled 2017-09-19 (×7): qty 1

## 2017-09-19 MED ORDER — SODIUM CHLORIDE 0.9% FLUSH
10.0000 mL | INTRAVENOUS | Status: DC | PRN
  Administered 2017-09-21: 10 mL
  Filled 2017-09-19: qty 40

## 2017-09-19 MED ORDER — SODIUM CHLORIDE 0.9 % IV SOLN
2.0000 g | Freq: Once | INTRAVENOUS | Status: AC
  Administered 2017-09-19: 2 g via INTRAVENOUS
  Filled 2017-09-19: qty 2

## 2017-09-19 MED ORDER — ALVIMOPAN 12 MG PO CAPS: 12.0000 mg | ORAL_CAPSULE | Freq: Two times a day (BID) | ORAL | Status: DC

## 2017-09-19 NOTE — Progress Notes (Signed)
GS Progress Note Subjective: Patient states that she is doing well.  Minimal complaints of pain.    Objective: Vital signs in last 24 hours: Temp:  [97 F (36.1 C)-99.7 F (37.6 C)] 98.7 F (37.1 C) (02/26 0450) Pulse Rate:  [67-101] 97 (02/26 0450) Resp:  [10-18] 18 (02/26 0450) BP: (110-166)/(44-94) 110/51 (02/26 0450) SpO2:  [94 %-100 %] 97 % (02/26 0450) Weight:  [88.5 kg (195 lb 1.7 oz)] 88.5 kg (195 lb 1.7 oz) (02/25 1411) Last BM Date: 09/16/17  Intake/Output from previous day: 02/25 0701 - 02/26 0700 In: 4547.9 [I.V.:4547.9] Out: 2285 [Urine:1875; Emesis/NG output:60; Blood:350] Intake/Output this shift: No intake/output data recorded.  Lungs: Clear  Abd: Hypoactive bowel sounds.  More distended than I expected.  Extremities: No changes  Neuro: Intact  Lab Results: CBC  Recent Labs    09/18/17 0711 09/18/17 1838 09/19/17 0434  WBC 5.8  --  11.5*  HGB 9.7* 10.2* 11.0*  HCT 31.4* 30.0* 35.9*  PLT 205  --  243   BMET Recent Labs    09/18/17 0711 09/18/17 1838 09/19/17 0434  NA 138 138 138  K 4.0 3.8 5.2*  CL 103  --  104  CO2 25  --  22  GLUCOSE 122* 152* 270*  BUN 15  --  16  CREATININE 1.31*  --  1.93*  CALCIUM 8.7*  --  7.6*   PT/INR No results for input(s): LABPROT, INR in the last 72 hours. ABG No results for input(s): PHART, HCO3 in the last 72 hours.  Invalid input(s): PCO2, PO2  Studies/Results: Dg Chest Port 1 View  Result Date: 09/18/2017 CLINICAL DATA:  CVL PLACEMENT DURING ABDOMINAL SURGERY EXAM: PORTABLE CHEST 1 VIEW COMPARISON:  Chest x-ray dated 09/15/2017. FINDINGS: Stable cardiomegaly. Left chest wall pacemaker leads appear grossly stable in position. Right IJ central line is now in place with tip at the level of the upper SVC. Enteric tube passes below the diaphragm. Lungs are clear.  No pleural effusion or pneumothorax seen. IMPRESSION: 1. Right IJ central line appears adequately positioned with tip at the level of the upper  SVC. 2. Stable cardiomegaly. 3. Lungs are clear.  No pleural effusion or pneumothorax seen. Electronically Signed   By: Franki Cabot M.D.   On: 09/18/2017 20:07   Dg Abd Portable 1v  Result Date: 09/18/2017 CLINICAL DATA:  NG tube placement EXAM: PORTABLE ABDOMEN - 1 VIEW COMPARISON:  09/15/2017 FINDINGS: NG tube tip is in the proximal stomach just beyond the GE junction. Gas within mildly prominent large and small bowel loops. IMPRESSION: NG tube tip in the proximal stomach. Electronically Signed   By: Rolm Baptise M.D.   On: 09/18/2017 01:13    Anti-infectives: Anti-infectives (From admission, onward)   Start     Dose/Rate Route Frequency Ordered Stop   09/18/17 1400  cefoTEtan (CEFOTAN) 2 g in sodium chloride 0.9 % 100 mL IVPB     2 g 200 mL/hr over 30 Minutes Intravenous On call to O.R. 09/18/17 0906 09/18/17 1530   09/18/17 1350  cefoTEtan in Dextrose 5% (CEFOTAN) 2-2.08 GM-%(50ML) IVPB    Comments:  Laurita Quint   : cabinet override      09/18/17 1350 09/19/17 0159      Assessment/Plan: s/p Procedure(s): EXPLORATORY LAPAROTOMY ILEOCOLECTOMY SIGMOIDOSCOPY LYSIS OF ADHESION Advance diet Clear liquids only  Keep Foley for now Saline bolus  LOS: 2 days    Kathryne Eriksson. [name], MD, FACS [phone] [facility] Surgery 09/19/2017

## 2017-09-19 NOTE — Progress Notes (Signed)
Bolus of normal saline administering as ordered for poor urine output

## 2017-09-20 LAB — CBC WITH DIFFERENTIAL/PLATELET
BASOS ABS: 0 10*3/uL (ref 0.0–0.1)
BASOS PCT: 0 %
Eosinophils Absolute: 0.3 10*3/uL (ref 0.0–0.7)
Eosinophils Relative: 2 %
HCT: 27.3 % — ABNORMAL LOW (ref 36.0–46.0)
HEMOGLOBIN: 8.6 g/dL — AB (ref 12.0–15.0)
Lymphocytes Relative: 15 %
Lymphs Abs: 1.9 10*3/uL (ref 0.7–4.0)
MCH: 26.4 pg (ref 26.0–34.0)
MCHC: 31.5 g/dL (ref 30.0–36.0)
MCV: 83.7 fL (ref 78.0–100.0)
Monocytes Absolute: 1.3 10*3/uL — ABNORMAL HIGH (ref 0.1–1.0)
Monocytes Relative: 10 %
NEUTROS PCT: 73 %
Neutro Abs: 9.6 10*3/uL — ABNORMAL HIGH (ref 1.7–7.7)
Platelets: 217 10*3/uL (ref 150–400)
RBC: 3.26 MIL/uL — AB (ref 3.87–5.11)
RDW: 15.4 % (ref 11.5–15.5)
WBC: 13.1 10*3/uL — AB (ref 4.0–10.5)

## 2017-09-20 LAB — BASIC METABOLIC PANEL
Anion gap: 6 (ref 5–15)
BUN: 24 mg/dL — ABNORMAL HIGH (ref 6–20)
CALCIUM: 6.8 mg/dL — AB (ref 8.9–10.3)
CO2: 23 mmol/L (ref 22–32)
Chloride: 101 mmol/L (ref 101–111)
Creatinine, Ser: 2.7 mg/dL — ABNORMAL HIGH (ref 0.44–1.00)
GFR, EST AFRICAN AMERICAN: 20 mL/min — AB (ref >60)
GFR, EST NON AFRICAN AMERICAN: 17 mL/min — AB (ref >60)
Glucose, Bld: 182 mg/dL — ABNORMAL HIGH (ref 65–99)
POTASSIUM: 4.6 mmol/L (ref 3.5–5.1)
Sodium: 130 mmol/L — ABNORMAL LOW (ref 135–145)

## 2017-09-20 LAB — CBC
HCT: 30.2 % — ABNORMAL LOW (ref 36.0–46.0)
HEMOGLOBIN: 9.3 g/dL — AB (ref 12.0–15.0)
MCH: 25.5 pg — AB (ref 26.0–34.0)
MCHC: 30.8 g/dL (ref 30.0–36.0)
MCV: 83 fL (ref 78.0–100.0)
Platelets: 237 10*3/uL (ref 150–400)
RBC: 3.64 MIL/uL — AB (ref 3.87–5.11)
RDW: 15 % (ref 11.5–15.5)
WBC: 14.3 10*3/uL — ABNORMAL HIGH (ref 4.0–10.5)

## 2017-09-20 LAB — GLUCOSE, CAPILLARY
Glucose-Capillary: 186 mg/dL — ABNORMAL HIGH (ref 65–99)
Glucose-Capillary: 170 mg/dL — ABNORMAL HIGH (ref 65–99)
Glucose-Capillary: 131 mg/dL — ABNORMAL HIGH (ref 65–99)
Glucose-Capillary: 93 mg/dL (ref 65–99)

## 2017-09-20 MED ORDER — SODIUM CHLORIDE 0.9 % IV SOLN
INTRAVENOUS | Status: DC
  Administered 2017-09-20 – 2017-09-25 (×10): via INTRAVENOUS

## 2017-09-20 MED ORDER — SODIUM CHLORIDE 0.9 % IV SOLN
500.0000 mL | Freq: Once | INTRAVENOUS | Status: AC
  Administered 2017-09-20: 500 mL via INTRAVENOUS

## 2017-09-20 NOTE — Progress Notes (Signed)
GS Progress Note Subjective: Patient states that she is doing well.  No flatus or BM  Objective: Vital signs in last 24 hours: Temp:  [98 F (36.7 C)-98.8 F (37.1 C)] 98 F (36.7 C) (02/27 0539) Pulse Rate:  [97-104] 102 (02/27 0539) Resp:  [12-20] 18 (02/27 0539) BP: (94-117)/(41-52) 117/41 (02/27 0539) SpO2:  [96 %-99 %] 98 % (02/27 0539) Last BM Date: 09/16/17  Intake/Output from previous day: 02/26 0701 - 02/27 0700 In: 630 [P.O.:630] Out: 350 [Urine:300; Drains:50] Intake/Output this shift: No intake/output data recorded.  Lungs: Clear  Abd: Hypoactive bowel sounds.  Distended.  Tender on the left side.  Extremities: No changes  Neuro: Intact  Lab Results: CBC  Recent Labs    09/19/17 0434 09/20/17 0327  WBC 11.5* 13.1*  HGB 11.0* 8.6*  HCT 35.9* 27.3*  PLT 243 217   BMET Recent Labs    09/19/17 0434 09/20/17 0327  NA 138 130*  K 5.2* 4.6  CL 104 101  CO2 22 23  GLUCOSE 270* 182*  BUN 16 24*  CREATININE 1.93* 2.70*  CALCIUM 7.6* 6.8*   PT/INR No results for input(s): LABPROT, INR in the last 72 hours. ABG No results for input(s): PHART, HCO3 in the last 72 hours.  Invalid input(s): PCO2, PO2  Studies/Results: Dg Chest Port 1 View  Result Date: 09/18/2017 CLINICAL DATA:  CVL PLACEMENT DURING ABDOMINAL SURGERY EXAM: PORTABLE CHEST 1 VIEW COMPARISON:  Chest x-ray dated 09/15/2017. FINDINGS: Stable cardiomegaly. Left chest wall pacemaker leads appear grossly stable in position. Right IJ central line is now in place with tip at the level of the upper SVC. Enteric tube passes below the diaphragm. Lungs are clear.  No pleural effusion or pneumothorax seen. IMPRESSION: 1. Right IJ central line appears adequately positioned with tip at the level of the upper SVC. 2. Stable cardiomegaly. 3. Lungs are clear.  No pleural effusion or pneumothorax seen. Electronically Signed   By: Franki Cabot M.D.   On: 09/18/2017 20:07     Anti-infectives: Anti-infectives (From admission, onward)   Start     Dose/Rate Route Frequency Ordered Stop   09/19/17 1515  cefoTEtan (CEFOTAN) 2 g in sodium chloride 0.9 % 100 mL IVPB     2 g 200 mL/hr over 30 Minutes Intravenous  Once 09/19/17 1501 09/19/17 1625   09/18/17 1400  cefoTEtan (CEFOTAN) 2 g in sodium chloride 0.9 % 100 mL IVPB     2 g 200 mL/hr over 30 Minutes Intravenous On call to O.R. 09/18/17 0906 09/18/17 1530   09/18/17 1350  cefoTEtan in Dextrose 5% (CEFOTAN) 2-2.08 GM-%(50ML) IVPB    Comments:  Laurita Quint   : cabinet override      09/18/17 1350 09/19/17 0159      Assessment/Plan: s/p Procedure(s): EXPLORATORY LAPAROTOMY ILEOCOLECTOMY SIGMOIDOSCOPY LYSIS OF ADHESION Continue foley due to strict I&O Renal function is worse and urine output marginal   Bolus with saline  LOS: 3 days    Kathryne Eriksson. [name], MD, FACS [phone] [facility] Surgery 09/20/2017

## 2017-09-21 LAB — BASIC METABOLIC PANEL
ANION GAP: 7 (ref 5–15)
BUN: 19 mg/dL (ref 6–20)
CHLORIDE: 105 mmol/L (ref 101–111)
CO2: 22 mmol/L (ref 22–32)
Calcium: 7 mg/dL — ABNORMAL LOW (ref 8.9–10.3)
Creatinine, Ser: 1.67 mg/dL — ABNORMAL HIGH (ref 0.44–1.00)
GFR calc Af Amer: 36 mL/min — ABNORMAL LOW (ref >60)
GFR, EST NON AFRICAN AMERICAN: 31 mL/min — AB (ref >60)
Glucose, Bld: 118 mg/dL — ABNORMAL HIGH (ref 65–99)
POTASSIUM: 4.1 mmol/L (ref 3.5–5.1)
Sodium: 134 mmol/L — ABNORMAL LOW (ref 135–145)

## 2017-09-21 LAB — CBC WITH DIFFERENTIAL/PLATELET
BASOS ABS: 0 10*3/uL (ref 0.0–0.1)
Basophils Relative: 0 %
EOS PCT: 3 %
Eosinophils Absolute: 0.4 10*3/uL (ref 0.0–0.7)
HEMATOCRIT: 23.6 % — AB (ref 36.0–46.0)
HEMOGLOBIN: 7.3 g/dL — AB (ref 12.0–15.0)
LYMPHS ABS: 1.3 10*3/uL (ref 0.7–4.0)
Lymphocytes Relative: 13 %
MCH: 25.5 pg — AB (ref 26.0–34.0)
MCHC: 30.9 g/dL (ref 30.0–36.0)
MCV: 82.5 fL (ref 78.0–100.0)
Monocytes Absolute: 1 10*3/uL (ref 0.1–1.0)
Monocytes Relative: 10 %
NEUTROS ABS: 7.9 10*3/uL — AB (ref 1.7–7.7)
NEUTROS PCT: 74 %
PLATELETS: 225 10*3/uL (ref 150–400)
RBC: 2.86 MIL/uL — ABNORMAL LOW (ref 3.87–5.11)
RDW: 14.9 % (ref 11.5–15.5)
WBC: 10.6 10*3/uL — ABNORMAL HIGH (ref 4.0–10.5)

## 2017-09-21 LAB — PREPARE RBC (CROSSMATCH)

## 2017-09-21 LAB — GLUCOSE, CAPILLARY
GLUCOSE-CAPILLARY: 122 mg/dL — AB (ref 65–99)
GLUCOSE-CAPILLARY: 179 mg/dL — AB (ref 65–99)
Glucose-Capillary: 123 mg/dL — ABNORMAL HIGH (ref 65–99)
Glucose-Capillary: 136 mg/dL — ABNORMAL HIGH (ref 65–99)

## 2017-09-21 MED ORDER — MUPIROCIN 2 % EX OINT
1.0000 "application " | TOPICAL_OINTMENT | Freq: Two times a day (BID) | CUTANEOUS | Status: AC
  Administered 2017-09-21 – 2017-09-26 (×10): 1 via NASAL
  Filled 2017-09-21: qty 22

## 2017-09-21 MED ORDER — ACETAMINOPHEN 325 MG PO TABS
650.0000 mg | ORAL_TABLET | Freq: Four times a day (QID) | ORAL | Status: DC | PRN
  Administered 2017-09-22 – 2017-09-24 (×4): 650 mg via ORAL
  Filled 2017-09-21 (×4): qty 2

## 2017-09-21 MED ORDER — CHLORHEXIDINE GLUCONATE CLOTH 2 % EX PADS
6.0000 | MEDICATED_PAD | Freq: Every day | CUTANEOUS | Status: AC
  Administered 2017-09-21 – 2017-09-25 (×5): 6 via TOPICAL

## 2017-09-21 MED ORDER — METHOCARBAMOL 1000 MG/10ML IJ SOLN
750.0000 mg | Freq: Three times a day (TID) | INTRAVENOUS | Status: DC | PRN
  Administered 2017-09-21 – 2017-09-23 (×4): 750 mg via INTRAVENOUS
  Filled 2017-09-21 (×4): qty 7.5

## 2017-09-21 NOTE — Progress Notes (Signed)
Wasted 3mg /ml of dilaudid from pca pump with Roosvelt Maser, RN in sink of med room A.

## 2017-09-21 NOTE — Progress Notes (Signed)
Patient temp still up no orders for tylenol or other fever reducer.  Having breakthrough pain and pulse sustaining in 120's.  MD paged

## 2017-09-21 NOTE — Progress Notes (Signed)
Patient complaining of uncontrolled pain despite use of PCA pump.  Pulse rates sustained in 120's and 130's.  MD paged

## 2017-09-21 NOTE — Progress Notes (Addendum)
GS Progress Note Subjective: Patient feeling better this AM.  Had some gas pass this AM.  No bowell movement.  Objective: Vital signs in last 24 hours: Temp:  [99.1 F (37.3 C)-100.7 F (38.2 C)] 99.1 F (37.3 C) (02/28 0541) Pulse Rate:  [94-104] 94 (02/28 0541) Resp:  [12-20] 12 (02/28 0541) BP: (96-124)/(39-58) 124/58 (02/28 0541) SpO2:  [92 %-100 %] 98 % (02/28 0541) Last BM Date: 09/16/17  Intake/Output from previous day: 02/27 0701 - 02/28 0700 In: 2112.5 [P.O.:1380; I.V.:732.5] Out: 2350 [Urine:2350] Intake/Output this shift: No intake/output data recorded.  Lungs: Clear  Abd: Better bowel sounds.  Less distended.  Still has diffuse mild tenderness  Extremities: No changes.  No clinical signs or symptoms of DVT  Neuro: Intact  Lab Results: CBC  Recent Labs    09/20/17 1110 09/21/17 0431  WBC 14.3* 10.6*  HGB 9.3* 7.3*  HCT 30.2* 23.6*  PLT 237 225   BMET Recent Labs    09/20/17 0327 09/21/17 0431  NA 130* 134*  K 4.6 4.1  CL 101 105  CO2 23 22  GLUCOSE 182* 118*  BUN 24* 19  CREATININE 2.70* 1.67*  CALCIUM 6.8* 7.0*   PT/INR No results for input(s): LABPROT, INR in the last 72 hours. ABG No results for input(s): PHART, HCO3 in the last 72 hours.  Invalid input(s): PCO2, PO2  Studies/Results: No results found.  Anti-infectives: Anti-infectives (From admission, onward)   Start     Dose/Rate Route Frequency Ordered Stop   09/19/17 1515  cefoTEtan (CEFOTAN) 2 g in sodium chloride 0.9 % 100 mL IVPB     2 g 200 mL/hr over 30 Minutes Intravenous  Once 09/19/17 1501 09/19/17 1625   09/18/17 1400  cefoTEtan (CEFOTAN) 2 g in sodium chloride 0.9 % 100 mL IVPB     2 g 200 mL/hr over 30 Minutes Intravenous On call to O.R. 09/18/17 0906 09/18/17 1530   09/18/17 1350  cefoTEtan in Dextrose 5% (CEFOTAN) 2-2.08 GM-%(50ML) IVPB    Comments:  Laurita Quint   : cabinet override      09/18/17 1350 09/19/17 0159      Assessment/Plan: s/p  Procedure(s): EXPLORATORY LAPAROTOMY ILEOCOLECTOMY SIGMOIDOSCOPY LYSIS OF ADHESION d/c foley Decrease IVFs.  Full liquid diet. Hemoglobin 7.3.  Will transfuse one unit of blood.   LOS: 4 days    Kathryne Eriksson. [name], MD, FACS [phone] [facility] Surgery 09/21/2017

## 2017-09-21 NOTE — Discharge Instructions (Addendum)
[facility], [place] [phone]  OPEN ABDOMINAL SURGERY: POST OP INSTRUCTIONS Status post Ileorectal connection/anastomosis  Always review your discharge instruction sheet given to you by the facility where your surgery was performed.  IF YOU HAVE DISABILITY OR FAMILY LEAVE FORMS, YOU MUST BRING THEM TO THE OFFICE FOR PROCESSING.  PLEASE DO NOT GIVE THEM TO YOUR DOCTOR.  1. A prescription for pain medication may be given to you upon discharge.  Take your pain medication as prescribed, if needed.  If narcotic pain medicine is not needed, then you may take acetaminophen (Tylenol) or ibuprofen (Advil) as needed. 2. Take your usually prescribed medications unless otherwise directed. 3. If you need a refill on your pain medication, please contact your pharmacy. They will contact our office to request authorization.  Prescriptions will not be filled after 5pm or on week-ends. 4. You should follow a light diet the first few days after arrival home, such as soup and crackers, pudding, etc.unless your doctor has advised otherwise. A high-fiber, low fat diet can be resumed as tolerated.   Be sure to include lots of fluids daily. Most patients will experience some swelling and bruising on the chest and neck area.  Ice packs will help.  Swelling and bruising can take several days to resolve 5. Most patients will experience some swelling and bruising in the area of the incision. Ice pack will help. Swelling and bruising can take several days to resolve..  6. It is common to experience some constipation if taking pain medication after surgery.  Increasing fluid intake and taking a stool softener will usually help or prevent this problem from occurring.  A mild laxative (Milk of Magnesia or Miralax) should be taken according to package directions if there are no bowel movements after 48 hours. 7.  You may have steri-strips (small skin tapes) in place directly over the incision.  These strips  should be left on the skin for 7-10 days.  If your surgeon used skin glue on the incision, you may shower in 24 hours.  The glue will flake off over the next 2-3 weeks.  Any sutures or staples will be removed at the office during your follow-up visit. You may find that a light gauze bandage over your incision may keep your staples from being rubbed or pulled. You may shower and replace the bandage daily. 8. ACTIVITIES:  You may resume regular (light) daily activities beginning the next day--such as daily self-care, walking, climbing stairs--gradually increasing activities as tolerated.  You may have sexual intercourse when it is comfortable.  Refrain from any heavy lifting or straining until approved by your doctor. a. You may drive when you no longer are taking prescription pain medication, you can comfortably wear a seatbelt, and you can safely maneuver your car and apply brakes b. Return to Work: ___________________________________ 15. You should see your doctor in the office for a follow-up appointment approximately two weeks after your surgery.  Make sure that you call for this appointment within a day or two after you arrive home to insure a convenient appointment time. OTHER INSTRUCTIONS:  __Incisional sponge dressing should stop in the next couple of days.  You can come to the office to have it removed.  There are staples on the incision that will be removed when you see Dr. Hulen Skains on March 12._____________________________________________________________  WHEN TO CALL YOUR DOCTOR: 1. Fever over 101.0 2. Inability to urinate 3. Nausea and/or vomiting 4. Extreme swelling or  bruising 5. Continued bleeding from incision. 6. Increased pain, redness, or drainage from the incision. 7. Difficulty swallowing or breathing 8. Muscle cramping or spasms. 9. Numbness or tingling in hands or feet or around lips.  The clinic staff is available to answer your questions during regular business hours.  Please  dont hesitate to call and ask to speak to one of the nurses if you have concerns.  For further questions, please visit [email or web address]

## 2017-09-21 NOTE — Progress Notes (Addendum)
Patient has oral temp of 101.  MD paged.

## 2017-09-22 LAB — TYPE AND SCREEN
ABO/RH(D): B POS
ANTIBODY SCREEN: NEGATIVE
Unit division: 0
Unit division: 0

## 2017-09-22 LAB — BASIC METABOLIC PANEL
Anion gap: 8 (ref 5–15)
BUN: 13 mg/dL (ref 6–20)
CALCIUM: 7.4 mg/dL — AB (ref 8.9–10.3)
CO2: 20 mmol/L — AB (ref 22–32)
Chloride: 107 mmol/L (ref 101–111)
Creatinine, Ser: 1.27 mg/dL — ABNORMAL HIGH (ref 0.44–1.00)
GFR calc Af Amer: 49 mL/min — ABNORMAL LOW (ref >60)
GFR, EST NON AFRICAN AMERICAN: 43 mL/min — AB (ref >60)
GLUCOSE: 127 mg/dL — AB (ref 65–99)
Potassium: 4.1 mmol/L (ref 3.5–5.1)
Sodium: 135 mmol/L (ref 135–145)

## 2017-09-22 LAB — BPAM RBC
BLOOD PRODUCT EXPIRATION DATE: 201903282359
Blood Product Expiration Date: 201903042359
ISSUE DATE / TIME: 201902281157
Unit Type and Rh: 7300
Unit Type and Rh: 7300

## 2017-09-22 LAB — CBC WITH DIFFERENTIAL/PLATELET
Basophils Absolute: 0 10*3/uL (ref 0.0–0.1)
Basophils Relative: 0 %
EOS PCT: 3 %
Eosinophils Absolute: 0.2 10*3/uL (ref 0.0–0.7)
HEMATOCRIT: 25.1 % — AB (ref 36.0–46.0)
Hemoglobin: 8.1 g/dL — ABNORMAL LOW (ref 12.0–15.0)
LYMPHS ABS: 1.4 10*3/uL (ref 0.7–4.0)
LYMPHS PCT: 16 %
MCH: 26.5 pg (ref 26.0–34.0)
MCHC: 32.3 g/dL (ref 30.0–36.0)
MCV: 82 fL (ref 78.0–100.0)
MONO ABS: 1.1 10*3/uL — AB (ref 0.1–1.0)
MONOS PCT: 12 %
Neutro Abs: 6.3 10*3/uL (ref 1.7–7.7)
Neutrophils Relative %: 69 %
PLATELETS: 238 10*3/uL (ref 150–400)
RBC: 3.06 MIL/uL — ABNORMAL LOW (ref 3.87–5.11)
RDW: 14.8 % (ref 11.5–15.5)
WBC: 9.1 10*3/uL (ref 4.0–10.5)

## 2017-09-22 LAB — GLUCOSE, CAPILLARY
Glucose-Capillary: 129 mg/dL — ABNORMAL HIGH (ref 65–99)
Glucose-Capillary: 168 mg/dL — ABNORMAL HIGH (ref 65–99)
Glucose-Capillary: 131 mg/dL — ABNORMAL HIGH (ref 65–99)
Glucose-Capillary: 109 mg/dL — ABNORMAL HIGH (ref 65–99)

## 2017-09-22 NOTE — Progress Notes (Signed)
GS Progress Note Subjective: Patient had a difficult day yesterday.  Lots of abdominal pain, fever and tachycardia.  All this has subsided currently.  P 92, sats 100%  Objective: Vital signs in last 24 hours: Temp:  [98.3 F (36.8 C)-101.4 F (38.6 C)] 98.3 F (36.8 C) (03/01 0643) Pulse Rate:  [88-124] 93 (03/01 0406) Resp:  [13-19] 17 (03/01 0406) BP: (115-146)/(41-75) 128/75 (03/01 0643) SpO2:  [92 %-100 %] 94 % (03/01 0643) Last BM Date: 09/16/17  Intake/Output from previous day: 02/28 0701 - 03/01 0700 In: 2555 [P.O.:180; I.V.:2002.5; Blood:315; IV Piggyback:57.5] Out: 2100 [Urine:2100] Intake/Output this shift: No intake/output data recorded.  Lungs: Clear.  Abd: Distended, good bowel sounds.  Mildly tender.  No peritonitis  Extremities: No changes  Neuro: Intact  Lab Results: CBC  Recent Labs    09/21/17 0431 09/22/17 0402  WBC 10.6* 9.1  HGB 7.3* 8.1*  HCT 23.6* 25.1*  PLT 225 238   BMET Recent Labs    09/21/17 0431 09/22/17 0402  NA 134* 135  K 4.1 4.1  CL 105 107  CO2 22 20*  GLUCOSE 118* 127*  BUN 19 13  CREATININE 1.67* 1.27*  CALCIUM 7.0* 7.4*   PT/INR No results for input(s): LABPROT, INR in the last 72 hours. ABG No results for input(s): PHART, HCO3 in the last 72 hours.  Invalid input(s): PCO2, PO2  Studies/Results: No results found.  Anti-infectives: Anti-infectives (From admission, onward)   Start     Dose/Rate Route Frequency Ordered Stop   09/19/17 1515  cefoTEtan (CEFOTAN) 2 g in sodium chloride 0.9 % 100 mL IVPB     2 g 200 mL/hr over 30 Minutes Intravenous  Once 09/19/17 1501 09/19/17 1625   09/18/17 1400  cefoTEtan (CEFOTAN) 2 g in sodium chloride 0.9 % 100 mL IVPB     2 g 200 mL/hr over 30 Minutes Intravenous On call to O.R. 09/18/17 0906 09/18/17 1530   09/18/17 1350  cefoTEtan in Dextrose 5% (CEFOTAN) 2-2.08 GM-%(50ML) IVPB    Comments:  Laurita Quint   : cabinet override      09/18/17 1350 09/19/17 0159       Assessment/Plan: s/p Procedure(s): EXPLORATORY LAPAROTOMY ILEOCOLECTOMY SIGMOIDOSCOPY LYSIS OF ADHESION Frustrating that in spite of performing an ileorectal anastomosis without tension or twisting, the patient has not opened up.    Maintain on full liquid diet.  Possible that function obstruction below our anastomosis  LOS: 5 days    Kathryne Eriksson. [name], MD, FACS [phone] [facility] Surgery 09/22/2017

## 2017-09-22 NOTE — Care Management Important Message (Signed)
Important Message  Patient Details  Name: Crystal [name] MRN: [id number] Date of Birth: 1951/03/22   Medicare Important Message Given:  Yes    Orbie Pyo 09/22/2017, 4:02 PM

## 2017-09-22 NOTE — Progress Notes (Signed)
After this RN txt paged, Dr. Redmond Pulling called to give new PRN orders, Robaxin 750 mg IV and Tylenol 650 mg PO.  Administered Robaxin as ordered and noted effective, patient able to sleep well.  Will monitor.

## 2017-09-23 LAB — CBC WITH DIFFERENTIAL/PLATELET
BASOS ABS: 0 10*3/uL (ref 0.0–0.1)
BASOS PCT: 0 %
Eosinophils Absolute: 0.3 10*3/uL (ref 0.0–0.7)
Eosinophils Relative: 4 %
HEMATOCRIT: 23.9 % — AB (ref 36.0–46.0)
Hemoglobin: 7.8 g/dL — ABNORMAL LOW (ref 12.0–15.0)
LYMPHS PCT: 19 %
Lymphs Abs: 1.5 10*3/uL (ref 0.7–4.0)
MCH: 26.7 pg (ref 26.0–34.0)
MCHC: 32.6 g/dL (ref 30.0–36.0)
MCV: 81.8 fL (ref 78.0–100.0)
Monocytes Absolute: 0.9 10*3/uL (ref 0.1–1.0)
Monocytes Relative: 11 %
NEUTROS ABS: 5.2 10*3/uL (ref 1.7–7.7)
Neutrophils Relative %: 66 %
PLATELETS: 271 10*3/uL (ref 150–400)
RBC: 2.92 MIL/uL — AB (ref 3.87–5.11)
RDW: 15.2 % (ref 11.5–15.5)
WBC: 7.9 10*3/uL (ref 4.0–10.5)

## 2017-09-23 LAB — BASIC METABOLIC PANEL
ANION GAP: 9 (ref 5–15)
BUN: 8 mg/dL (ref 6–20)
CALCIUM: 7.8 mg/dL — AB (ref 8.9–10.3)
CO2: 21 mmol/L — AB (ref 22–32)
Chloride: 107 mmol/L (ref 101–111)
Creatinine, Ser: 1.04 mg/dL — ABNORMAL HIGH (ref 0.44–1.00)
GFR calc Af Amer: >60 mL/min (ref >60)
GFR, EST NON AFRICAN AMERICAN: 54 mL/min — AB (ref >60)
GLUCOSE: 95 mg/dL (ref 65–99)
POTASSIUM: 3.8 mmol/L (ref 3.5–5.1)
Sodium: 137 mmol/L (ref 135–145)

## 2017-09-23 LAB — GLUCOSE, CAPILLARY
GLUCOSE-CAPILLARY: 122 mg/dL — AB (ref 65–99)
GLUCOSE-CAPILLARY: 183 mg/dL — AB (ref 65–99)
GLUCOSE-CAPILLARY: 100 mg/dL — AB (ref 65–99)
Glucose-Capillary: 97 mg/dL (ref 65–99)

## 2017-09-23 MED ORDER — METHOCARBAMOL 500 MG PO TABS: 500.0000 mg | ORAL_TABLET | Freq: Three times a day (TID) | ORAL | Status: DC | PRN

## 2017-09-23 MED ORDER — OXYCODONE HCL 5 MG PO TABS
5.0000 mg | ORAL_TABLET | ORAL | Status: DC | PRN
  Administered 2017-09-24: 10 mg via ORAL
  Filled 2017-09-23: qty 2

## 2017-09-23 MED ORDER — DOCUSATE SODIUM 100 MG PO CAPS
100.0000 mg | ORAL_CAPSULE | Freq: Two times a day (BID) | ORAL | Status: DC
  Administered 2017-09-23 – 2017-09-24 (×2): 100 mg via ORAL
  Filled 2017-09-23 (×4): qty 1

## 2017-09-23 MED ORDER — HYDROMORPHONE HCL 1 MG/ML IJ SOLN: 0.5000 mg | INTRAMUSCULAR | Status: DC | PRN

## 2017-09-23 NOTE — Progress Notes (Signed)
[facility] Progress Note  5 Days Post-Op  Subjective: CC-  Patient states that she had 3 BMs yesterday. Passing flatus this morning. Still having some abdominal pain but it is improving. Denies n/v. Tolerating full liquids.  Objective: Vital signs in last 24 hours: Temp:  [98.5 F (36.9 C)-99.6 F (37.6 C)] 98.5 F (36.9 C) (03/02 0544) Pulse Rate:  [87-97] 89 (03/02 0544) Resp:  [10-18] 15 (03/02 0544) BP: (131-144)/(49-65) 140/61 (03/02 0544) SpO2:  [98 %-100 %] 100 % (03/02 0544) Last BM Date: 09/22/17  Intake/Output from previous day: 03/01 0701 - 03/02 0700 In: 3007.8 [P.O.:1044; I.V.:1791.3; IV Piggyback:172.5] Out: 0  Intake/Output this shift: No intake/output data recorded.  PE: Gen:  Alert, NAD, pleasant HEENT: EOM's intact, pupils equal and round Card:  RRR Pulm:  CTAB, no W/R/R, effort normal Abd: Soft, distended, +BS, midline incision with Preveena vac in place Psych: A&Ox3  Skin: no rashes noted, warm and dry  Lab Results:  Recent Labs    09/22/17 0402 09/23/17 0432  WBC 9.1 7.9  HGB 8.1* 7.8*  HCT 25.1* 23.9*  PLT 238 271   BMET Recent Labs    09/22/17 0402 09/23/17 0432  NA 135 137  K 4.1 3.8  CL 107 107  CO2 20* 21*  GLUCOSE 127* 95  BUN 13 8  CREATININE 1.27* 1.04*  CALCIUM 7.4* 7.8*   PT/INR No results for input(s): LABPROT, INR in the last 72 hours. CMP     Component Value Date/Time   NA 137 09/23/2017 0432   NA 137 02/18/2016   K 3.8 09/23/2017 0432   CL 107 09/23/2017 0432   CO2 21 (L) 09/23/2017 0432   GLUCOSE 95 09/23/2017 0432   BUN 8 09/23/2017 0432   BUN 36 (A) 07/26/2016   CREATININE 1.04 (H) 09/23/2017 0432   CREATININE 3.10 (H) 08/17/2017 1545   CALCIUM 7.8 (L) 09/23/2017 0432   PROT 8.5 (H) 09/15/2017 1629   ALBUMIN 4.2 09/15/2017 1629   AST 19 09/15/2017 1629   ALT 16 09/15/2017 1629   ALKPHOS 104 09/15/2017 1629   BILITOT 1.1 09/15/2017 1629   GFRNONAA 54 (L) 09/23/2017 0432   GFRNONAA  15 (L) 08/17/2017 1545   GFRAA >60 09/23/2017 0432   GFRAA 17 (L) 08/17/2017 1545   Lipase     Component Value Date/Time   LIPASE 35 09/15/2017 1629       Studies/Results: No results found.  Anti-infectives: Anti-infectives (From admission, onward)   Start     Dose/Rate Route Frequency Ordered Stop   09/19/17 1515  cefoTEtan (CEFOTAN) 2 g in sodium chloride 0.9 % 100 mL IVPB     2 g 200 mL/hr over 30 Minutes Intravenous  Once 09/19/17 1501 09/19/17 1625   09/18/17 1400  cefoTEtan (CEFOTAN) 2 g in sodium chloride 0.9 % 100 mL IVPB     2 g 200 mL/hr over 30 Minutes Intravenous On call to O.R. 09/18/17 0906 09/18/17 1530   09/18/17 1350  cefoTEtan in Dextrose 5% (CEFOTAN) 2-2.08 GM-%(50ML) IVPB    Comments:  Laurita Quint   : cabinet override      09/18/17 1350 09/19/17 0159       Assessment/Plan Asthma Depression DM Hx of CAD Hx of CHF- ECHO 03/31/16 showed45% - 50% Pacemaker in place CKD stage III - Cr 1.04 today ABL anemia - Hg 7.8 today, stable  S/p EXPLORATORY LAPAROTOMY, ILEOCOLECTOMY, SIGMOIDOSCOPY, EXTENSIVE LYSIS OF ADHESION 2/25 Dr. Hulen Skains - POD 5 - path: COLON  WITH ULCER, ISCHEMIC CHANGES AND FIBROUS ADHESIONS, NO MALIGNANCY - bowel function returning  ID - cefotetan perioperative FEN - IVF, soft diet, colace VTE - SCDs, no chemical DVT prophylaxis due to anemia Foley - none Follow up - Dr. Hulen Skains  Plan - Advance to soft diet. D/c PCA and add oral pain meds. Encourage ambulation.    LOS: 6 days    Wellington Hampshire , [facility] 09/23/2017, 7:31 AM Pager: [phone] Consults: [phone] Mon-Fri 7:00 am-4:30 pm Sat-Sun 7:00 am-11:30 am

## 2017-09-24 LAB — CBC
HCT: 27.1 % — ABNORMAL LOW (ref 36.0–46.0)
Hemoglobin: 8.9 g/dL — ABNORMAL LOW (ref 12.0–15.0)
MCH: 26.4 pg (ref 26.0–34.0)
MCHC: 32.8 g/dL (ref 30.0–36.0)
MCV: 80.4 fL (ref 78.0–100.0)
PLATELETS: 310 10*3/uL (ref 150–400)
RBC: 3.37 MIL/uL — ABNORMAL LOW (ref 3.87–5.11)
RDW: 15 % (ref 11.5–15.5)
WBC: 8.6 10*3/uL (ref 4.0–10.5)

## 2017-09-24 LAB — URINALYSIS, ROUTINE W REFLEX MICROSCOPIC
Bilirubin Urine: NEGATIVE
Glucose, UA: NEGATIVE mg/dL
Ketones, ur: 5 mg/dL — AB
LEUKOCYTES UA: NEGATIVE
Nitrite: NEGATIVE
PH: 5 (ref 5.0–8.0)
Protein, ur: 30 mg/dL — AB
SPECIFIC GRAVITY, URINE: 1.014 (ref 1.005–1.030)
SQUAMOUS EPITHELIAL / LPF: NONE SEEN

## 2017-09-24 LAB — BASIC METABOLIC PANEL
ANION GAP: 10 (ref 5–15)
BUN: <5 mg/dL — ABNORMAL LOW (ref 6–20)
CO2: 21 mmol/L — ABNORMAL LOW (ref 22–32)
Calcium: 7.8 mg/dL — ABNORMAL LOW (ref 8.9–10.3)
Chloride: 103 mmol/L (ref 101–111)
Creatinine, Ser: 0.98 mg/dL (ref 0.44–1.00)
GFR calc Af Amer: >60 mL/min (ref >60)
GFR, EST NON AFRICAN AMERICAN: 58 mL/min — AB (ref >60)
GLUCOSE: 139 mg/dL — AB (ref 65–99)
Potassium: 3.6 mmol/L (ref 3.5–5.1)
SODIUM: 134 mmol/L — AB (ref 135–145)

## 2017-09-24 LAB — GLUCOSE, CAPILLARY
GLUCOSE-CAPILLARY: 115 mg/dL — AB (ref 65–99)
GLUCOSE-CAPILLARY: 179 mg/dL — AB (ref 65–99)
Glucose-Capillary: 162 mg/dL — ABNORMAL HIGH (ref 65–99)
Glucose-Capillary: 158 mg/dL — ABNORMAL HIGH (ref 65–99)

## 2017-09-24 MED ORDER — PREMIER PROTEIN SHAKE
2.0000 [oz_av] | Freq: Two times a day (BID) | ORAL | Status: DC
  Administered 2017-09-24: 2 [oz_av] via ORAL
  Filled 2017-09-24 (×10): qty 325.31

## 2017-09-24 MED ORDER — PANTOPRAZOLE SODIUM 40 MG PO TBEC
40.0000 mg | DELAYED_RELEASE_TABLET | Freq: Every day | ORAL | Status: DC
  Administered 2017-09-24 – 2017-09-25 (×2): 40 mg via ORAL
  Filled 2017-09-24 (×2): qty 1

## 2017-09-24 MED ORDER — ENOXAPARIN SODIUM 40 MG/0.4ML ~~LOC~~ SOLN
40.0000 mg | SUBCUTANEOUS | Status: DC
  Administered 2017-09-24 – 2017-09-25 (×2): 40 mg via SUBCUTANEOUS
  Filled 2017-09-24 (×3): qty 0.4

## 2017-09-24 MED ORDER — LEVOTHYROXINE SODIUM 88 MCG PO TABS
88.0000 ug | ORAL_TABLET | Freq: Every day | ORAL | Status: DC
Start: 2017-09-25 — End: 2017-09-26
  Administered 2017-09-25 – 2017-09-26 (×2): 88 ug via ORAL
  Filled 2017-09-24 (×2): qty 1

## 2017-09-24 NOTE — Progress Notes (Addendum)
[facility] Progress Note  6 Days Post-Op  Subjective: CC- weak Patient states that she did not feel great over night, cannot quite explain her symptoms. She had a low grade temp 100.3 and chills. Denies increased abdominal pain, nausea, vomiting. She had a medium sized BM yesterday, passing flatus. Tolerating diet but not eating a lot. Denies CP, SOB, cough, calf pain/swelling. Pulling 1700 on IS. She does report some dysuria and cramping with urination.  States that she ambulated in the halls yesterday.  Objective: Vital signs in last 24 hours: Temp:  [98.3 F (36.8 C)-100.3 F (37.9 C)] 98.3 F (36.8 C) (03/03 0539) Pulse Rate:  [103-118] 118 (03/03 0437) Resp:  [16-19] 19 (03/03 0437) BP: (130-156)/(42-80) 130/71 (03/03 0437) SpO2:  [97 %-100 %] 97 % (03/03 0437) Last BM Date: 09/23/17  Intake/Output from previous day: 03/02 0701 - 03/03 0700 In: 320 [P.O.:320] Out: 1800 [Urine:1800] Intake/Output this shift: No intake/output data recorded.  PE: Gen:  Alert, NAD, pleasant HEENT: EOM's intact, pupils equal and round Card:  RRR Pulm:  CTAB, no W/R/R, effort normal Abd: Soft, distended, +BS, midline incision with Preveena vac in place, mild TTP LLQ Psych: A&Ox3  Skin: no rashes noted, warm and dry  Lab Results:  Recent Labs    09/23/17 0432 09/24/17 0448  WBC 7.9 8.6  HGB 7.8* 8.9*  HCT 23.9* 27.1*  PLT 271 310   BMET Recent Labs    09/23/17 0432 09/24/17 0448  NA 137 134*  K 3.8 3.6  CL 107 103  CO2 21* 21*  GLUCOSE 95 139*  BUN 8 <5*  CREATININE 1.04* 0.98  CALCIUM 7.8* 7.8*   PT/INR No results for input(s): LABPROT, INR in the last 72 hours. CMP     Component Value Date/Time   NA 134 (L) 09/24/2017 0448   NA 137 02/18/2016   K 3.6 09/24/2017 0448   CL 103 09/24/2017 0448   CO2 21 (L) 09/24/2017 0448   GLUCOSE 139 (H) 09/24/2017 0448   BUN <5 (L) 09/24/2017 0448   BUN 36 (A) 07/26/2016   CREATININE 0.98 09/24/2017 0448   CREATININE 3.10 (H) 08/17/2017 1545   CALCIUM 7.8 (L) 09/24/2017 0448   PROT 8.5 (H) 09/15/2017 1629   ALBUMIN 4.2 09/15/2017 1629   AST 19 09/15/2017 1629   ALT 16 09/15/2017 1629   ALKPHOS 104 09/15/2017 1629   BILITOT 1.1 09/15/2017 1629   GFRNONAA 58 (L) 09/24/2017 0448   GFRNONAA 15 (L) 08/17/2017 1545   GFRAA >60 09/24/2017 0448   GFRAA 17 (L) 08/17/2017 1545   Lipase     Component Value Date/Time   LIPASE 35 09/15/2017 1629       Studies/Results: No results found.  Anti-infectives: Anti-infectives (From admission, onward)   Start     Dose/Rate Route Frequency Ordered Stop   09/19/17 1515  cefoTEtan (CEFOTAN) 2 g in sodium chloride 0.9 % 100 mL IVPB     2 g 200 mL/hr over 30 Minutes Intravenous  Once 09/19/17 1501 09/19/17 1625   09/18/17 1400  cefoTEtan (CEFOTAN) 2 g in sodium chloride 0.9 % 100 mL IVPB     2 g 200 mL/hr over 30 Minutes Intravenous On call to O.R. 09/18/17 0906 09/18/17 1530   09/18/17 1350  cefoTEtan in Dextrose 5% (CEFOTAN) 2-2.08 GM-%(50ML) IVPB    Comments:  Laurita Quint   : cabinet override      09/18/17 1350 09/19/17 0159       Assessment/Plan Asthma  Depression DM Hx of CAD Hx of CHF- ECHO 03/31/16 showed45% - 50% Pacemaker in place CKD stage III - Cr 0.98 today ABL anemia - Hg 8.9 today, stable  S/p EXPLORATORY LAPAROTOMY, ILEOCOLECTOMY, SIGMOIDOSCOPY, EXTENSIVELYSIS OF ADHESION 2/25 Dr. Hulen Skains - POD 6 - path: COLON WITH ULCER, ISCHEMIC CHANGES AND FIBROUS ADHESIONS, NO MALIGNANCY - bowel function returning  ID - cefotetan perioperative FEN - IVF, soft diet, colace, protein shake VTE - SCDs, lovenox Foley - none Follow up - Dr. Hulen Skains  Plan - Check urinalysis. Add protein shakes. Start lovenox. Labs in AM.    LOS: 7 days    Wellington Hampshire , [facility] 09/24/2017, 8:09 AM Pager: [phone] Consults: [phone] Mon-Fri 7:00 am-4:30 pm Sat-Sun 7:00 am-11:30 am

## 2017-09-25 ENCOUNTER — Ambulatory Visit: Payer: Medicare Other

## 2017-09-25 HISTORY — PX: PARTIAL COLECTOMY: SHX5273

## 2017-09-25 LAB — GLUCOSE, CAPILLARY
GLUCOSE-CAPILLARY: 133 mg/dL — AB (ref 65–99)
GLUCOSE-CAPILLARY: 174 mg/dL — AB (ref 65–99)
Glucose-Capillary: 134 mg/dL — ABNORMAL HIGH (ref 65–99)
Glucose-Capillary: 117 mg/dL — ABNORMAL HIGH (ref 65–99)

## 2017-09-25 LAB — CBC
HCT: 25.7 % — ABNORMAL LOW (ref 36.0–46.0)
Hemoglobin: 8.4 g/dL — ABNORMAL LOW (ref 12.0–15.0)
MCH: 26.3 pg (ref 26.0–34.0)
MCHC: 32.7 g/dL (ref 30.0–36.0)
MCV: 80.6 fL (ref 78.0–100.0)
Platelets: 328 10*3/uL (ref 150–400)
RBC: 3.19 MIL/uL — ABNORMAL LOW (ref 3.87–5.11)
RDW: 15.4 % (ref 11.5–15.5)
WBC: 8.9 10*3/uL (ref 4.0–10.5)

## 2017-09-25 LAB — BASIC METABOLIC PANEL
ANION GAP: 7 (ref 5–15)
BUN: <5 mg/dL — ABNORMAL LOW (ref 6–20)
CALCIUM: 7.8 mg/dL — AB (ref 8.9–10.3)
CO2: 22 mmol/L (ref 22–32)
CREATININE: 0.97 mg/dL (ref 0.44–1.00)
Chloride: 107 mmol/L (ref 101–111)
GFR calc Af Amer: >60 mL/min (ref >60)
GFR, EST NON AFRICAN AMERICAN: 59 mL/min — AB (ref >60)
GLUCOSE: 145 mg/dL — AB (ref 65–99)
Potassium: 3.3 mmol/L — ABNORMAL LOW (ref 3.5–5.1)
Sodium: 136 mmol/L (ref 135–145)

## 2017-09-25 MED ORDER — FERROUS GLUCONATE 324 (38 FE) MG PO TABS
324.0000 mg | ORAL_TABLET | Freq: Three times a day (TID) | ORAL | Status: DC
  Administered 2017-09-25 – 2017-09-26 (×4): 324 mg via ORAL
  Filled 2017-09-25 (×5): qty 1

## 2017-09-25 MED ORDER — PROSIGHT PO TABS
1.0000 | ORAL_TABLET | Freq: Every day | ORAL | Status: DC
  Administered 2017-09-25 – 2017-09-26 (×2): 1 via ORAL
  Filled 2017-09-25 (×2): qty 1

## 2017-09-25 MED ORDER — AMPICILLIN 500 MG PO CAPS
500.0000 mg | ORAL_CAPSULE | Freq: Three times a day (TID) | ORAL | Status: DC
  Administered 2017-09-25 – 2017-09-26 (×4): 500 mg via ORAL
  Filled 2017-09-25 (×4): qty 1

## 2017-09-25 NOTE — Care Management Note (Signed)
Case Management Note  Patient Details  Name: Crystal [name] MRN: [id number] Date of Birth: May 08, 1951  Subjective/Objective:                    Action/Plan:  Prevena incisional negative pressure wound dressing   Will follow for discharge needs  Expected Discharge Date:                  Expected Discharge Plan:  Home/Self Care  In-House Referral:     Discharge planning Services     Post Acute Care Choice:    Choice offered to:     DME Arranged:    DME Agency:     HH Arranged:    [place] Agency:     Status of Service:  In process, will continue to follow  If discussed at Long Length of Stay Meetings, dates discussed:    Additional Comments:  Marilu Favre, RN 09/25/2017, 11:17 AM

## 2017-09-25 NOTE — Progress Notes (Signed)
Patient has an enterococcal UTI.  Will start on Ampicillin per pharmacist recommendation  Kathryne Eriksson. [name], MD, [place] [phone] [facility]

## 2017-09-25 NOTE — Progress Notes (Signed)
GS Progress Note Subjective: Patient doing much better and nearing being able to go home.  Starting to have frequent bowel movements  Objective: Vital signs in last 24 hours: Temp:  [98.3 F (36.8 C)-99.7 F (37.6 C)] 98.3 F (36.8 C) (03/04 0600) Pulse Rate:  [74-111] 74 (03/04 0600) Resp:  [18] 18 (03/04 0600) BP: (139-152)/(67-78) 152/78 (03/04 0600) SpO2:  [99 %-100 %] 100 % (03/04 0600) Last BM Date: 09/24/17  Intake/Output from previous day: 03/03 0701 - 03/04 0700 In: 1300 [P.O.:700; I.V.:600] Out: -  Intake/Output this shift: Total I/O In: 550 [P.O.:550] Out: -   Lungs: Clear  Abd: Soft, great bowel sounds.  Extremities: No changes  Neuro: Intact  Lab Results: CBC  Recent Labs    09/24/17 0448 09/25/17 0541  WBC 8.6 8.9  HGB 8.9* 8.4*  HCT 27.1* 25.7*  PLT 310 328   BMET Recent Labs    09/24/17 0448 09/25/17 0541  NA 134* 136  K 3.6 3.3*  CL 103 107  CO2 21* 22  GLUCOSE 139* 145*  BUN <5* <5*  CREATININE 0.98 0.97  CALCIUM 7.8* 7.8*   PT/INR No results for input(s): LABPROT, INR in the last 72 hours. ABG No results for input(s): PHART, HCO3 in the last 72 hours.  Invalid input(s): PCO2, PO2  Studies/Results: No results found.  Anti-infectives: Anti-infectives (From admission, onward)   Start     Dose/Rate Route Frequency Ordered Stop   09/19/17 1515  cefoTEtan (CEFOTAN) 2 g in sodium chloride 0.9 % 100 mL IVPB     2 g 200 mL/hr over 30 Minutes Intravenous  Once 09/19/17 1501 09/19/17 1625   09/18/17 1400  cefoTEtan (CEFOTAN) 2 g in sodium chloride 0.9 % 100 mL IVPB     2 g 200 mL/hr over 30 Minutes Intravenous On call to O.R. 09/18/17 0906 09/18/17 1530   09/18/17 1350  cefoTEtan in Dextrose 5% (CEFOTAN) 2-2.08 GM-%(50ML) IVPB    Comments:  Laurita Quint   : cabinet override      09/18/17 1350 09/19/17 0159      Assessment/Plan: s/p Procedure(s): EXPLORATORY LAPAROTOMY ILEOCOLECTOMY SIGMOIDOSCOPY LYSIS OF  ADHESION Advance diet Plan for discharge tomorrow Will discontinue centra line and give saline lock.  Will need to keep up with fluid losses with frequent loose bowel movements.  LOS: 8 days    Kathryne Eriksson. [name], MD, FACS [phone] [facility] Surgery 09/25/2017

## 2017-09-26 LAB — URINE CULTURE: Culture: >=100000 — AB

## 2017-09-26 LAB — GLUCOSE, CAPILLARY
Glucose-Capillary: 141 mg/dL — ABNORMAL HIGH (ref 65–99)
Glucose-Capillary: 210 mg/dL — ABNORMAL HIGH (ref 65–99)

## 2017-09-26 MED ORDER — AMPICILLIN 500 MG PO CAPS: 500.0000 mg | ORAL_CAPSULE | Freq: Three times a day (TID) | ORAL | 0 refills | Status: DC

## 2017-09-26 MED ORDER — FERROUS GLUCONATE 324 (38 FE) MG PO TABS: 324.0000 mg | ORAL_TABLET | Freq: Three times a day (TID) | ORAL | 3 refills | Status: DC

## 2017-09-26 MED ORDER — PROSIGHT PO TABS: 1.0000 | ORAL_TABLET | Freq: Every day | ORAL | 0 refills | Status: DC

## 2017-09-26 MED ORDER — OXYCODONE HCL 5 MG PO TABS: 5.0000 mg | ORAL_TABLET | ORAL | 0 refills | Status: DC | PRN

## 2017-09-26 NOTE — Progress Notes (Signed)
GS Progress Note Subjective: Patient doing well and having bowel movements.  Asked about the Prevena and when it will be removed.  Objective: Vital signs in last 24 hours: Temp:  [98.7 F (37.1 C)-100.6 F (38.1 C)] 99.8 F (37.7 C) (03/05 0452) Pulse Rate:  [103-113] 103 (03/05 0452) Resp:  [18] 18 (03/05 0452) BP: (142-150)/(64-68) 150/68 (03/05 0452) SpO2:  [98 %-99 %] 99 % (03/05 0452) Last BM Date: 09/25/17  Intake/Output from previous day: 03/04 0701 - 03/05 0700 In: 1560 [P.O.:1560] Out: 0  Intake/Output this shift: No intake/output data recorded.  Lungs: Clear  Abd: Soft, good bowel sounds.  Still looks a bit distended  Extremities: No clinical signs or symptoms of DVT  Neuro: Intact  Lab Results: CBC  Recent Labs    09/24/17 0448 09/25/17 0541  WBC 8.6 8.9  HGB 8.9* 8.4*  HCT 27.1* 25.7*  PLT 310 328   BMET Recent Labs    09/24/17 0448 09/25/17 0541  NA 134* 136  K 3.6 3.3*  CL 103 107  CO2 21* 22  GLUCOSE 139* 145*  BUN <5* <5*  CREATININE 0.98 0.97  CALCIUM 7.8* 7.8*   PT/INR No results for input(s): LABPROT, INR in the last 72 hours. ABG No results for input(s): PHART, HCO3 in the last 72 hours.  Invalid input(s): PCO2, PO2  Studies/Results: No results found.  Anti-infectives: Anti-infectives (From admission, onward)   Start     Dose/Rate Route Frequency Ordered Stop   09/25/17 1530  ampicillin (PRINCIPEN) capsule 500 mg     500 mg Oral Every 8 hours 09/25/17 1444     09/19/17 1515  cefoTEtan (CEFOTAN) 2 g in sodium chloride 0.9 % 100 mL IVPB     2 g 200 mL/hr over 30 Minutes Intravenous  Once 09/19/17 1501 09/19/17 1625   09/18/17 1400  cefoTEtan (CEFOTAN) 2 g in sodium chloride 0.9 % 100 mL IVPB     2 g 200 mL/hr over 30 Minutes Intravenous On call to O.R. 09/18/17 0906 09/18/17 1530   09/18/17 1350  cefoTEtan in Dextrose 5% (CEFOTAN) 2-2.08 GM-%(50ML) IVPB    Comments:  Laurita Quint   : cabinet override      09/18/17 1350  09/19/17 0159      Assessment/Plan: s/p Procedure(s): EXPLORATORY LAPAROTOMY ILEOCOLECTOMY SIGMOIDOSCOPY LYSIS OF ADHESION Discharge Will come back and see patient about noon to make sure she continues to do well, then likely discharge this afternoon.  LOS: 9 days    Kathryne Eriksson. [name], MD, FACS [phone] [facility] Surgery 09/26/2017

## 2017-09-26 NOTE — Progress Notes (Signed)
Discharge instructions reviewed with the patient.  She did not have any questions. She has the information to call the office and schedule her f/u appointment on 10/03/2017.  Pt was given her discharge instructions and her script for oxycodone.  Pt advised her prescripts have also been sent to her pharmacy for pick up. Pt to be picked up by her son-in-law

## 2017-09-26 NOTE — Discharge Summary (Signed)
Physician Discharge Summary  Patient ID: Crystal [name] MRN: [id number] DOB/AGE: October 02, 1950 67 y.o.  Admit date: 09/15/2017 Discharge date: 09/26/2017  Admission Diagnoses:  Discharge Diagnoses:  Active Problems:   Colonic obstruction [facility])   Discharged Condition: good  Hospital Course: Patient was admitted with a large bowel obstruction once again.  This is been treated conservatively with flexible sigmoidoscopy and stent placement previously, but the stent fell out and the patient required surgery.  The patient underwent surgery on February 25 at which time she underwent a resection of a right colon and ileal rectal anastomosis.  The patient postoperatively did fairly well but it took her several days, up to 5 days, to start passing gas through her anastomosis and having stools.  Currently she is having several bowel movements a day and at some point may need to have medications added in order to slow down her frequent bowel movements.  However currently we are moving ahead without that.  She developed a urinary tract infection with enterococcus and was started on ampicillin the day prior to discharge.  Wound is covered with a negative pressure incisional dressing.  This will likely run out the next couple of days and she can come to our office to have it removed.  She has staples underneath the dressing.  She is otherwise doing well and will come back to see me on March 12 for follow-up and possible staple removal.  Consults: None  Significant Diagnostic Studies: labs: cbc/bmet and microbiology: urine culture: positive for Enterococcus  Treatments: IV hydration, antibiotics: Ancef and amp[icillin, analgesia: Dilaudid and oxycodone and tramadol, insulin: Humalog, regular and Lantus and surgery: Ileoproctotomy  Discharge Exam: Blood pressure (!) 150/68, pulse (!) 103, temperature 99.8 F (37.7 C), temperature source Oral, resp. rate 18, height 5\' 5"  (1.651 m), weight 88.5 kg (195 lb  1.7 oz), SpO2 99 %. General appearance: alert, cooperative and no distress Resp: clear to auscultation bilaterally GI: soft, non-tender; bowel sounds normal; no masses,  no organomegaly and Prevena in place and still working.  Disposition: 01-Home or Self Care  Discharge Instructions    Call MD for:  difficulty breathing, headache or visual disturbances   Complete by:  As directed    Call MD for:  extreme fatigue   Complete by:  As directed    Call MD for:  hives   Complete by:  As directed    Call MD for:  persistant dizziness or light-headedness   Complete by:  As directed    Call MD for:  persistant nausea and vomiting   Complete by:  As directed    Call MD for:  redness, tenderness, or signs of infection (pain, swelling, redness, odor or green/yellow discharge around incision site)   Complete by:  As directed    Call MD for:  severe uncontrolled pain   Complete by:  As directed    Diet Carb Modified   Complete by:  As directed    Increase activity slowly   Complete by:  As directed      Allergies as of 09/26/2017      Reactions   Glucophage [metformin Hcl] Other (See Comments)   Renal failure   Ace Inhibitors Swelling, Other (See Comments)   Angioedema   Advicor [niacin-lovastatin Er] Other (See Comments)   Muscle aches   Bystolic [nebivolol Hcl] Other (See Comments)   Edema   Erythromycin Diarrhea, Nausea And Vomiting, Other (See Comments)   *DERIVATIVES*   Lipitor [atorvastatin] Other (See Comments)  Muscle aches   Lopid [gemfibrozil] Other (See Comments)   Muscle aches   Statins Other (See Comments)   Muscle aches   Telmisartan    AVOID ARB/ ACEi in this patient due to recurrent AKI   Adhesive [tape] Rash      Medication List    STOP taking these medications   ferrous sulfate 325 (65 FE) MG EC tablet     TAKE these medications   acetaminophen 500 MG tablet Commonly known as:  TYLENOL Take 1,000 mg by mouth every 6 (six) hours as needed for moderate  pain or headache.   ampicillin 500 MG capsule Commonly known as:  PRINCIPEN Take 1 capsule (500 mg total) by mouth every 8 (eight) hours.   carvedilol 12.5 MG tablet Commonly known as:  COREG Take 12.5 mg by mouth 2 (two) times daily with a meal.   CRESTOR 10 MG tablet Generic drug:  rosuvastatin Take 1 tablet (10 mg total) by mouth at bedtime.   escitalopram 10 MG tablet Commonly known as:  LEXAPRO Take 10 mg by mouth at bedtime.   ferrous gluconate 324 MG tablet Commonly known as:  FERGON Take 1 tablet (324 mg total) by mouth 3 (three) times daily with meals.   gabapentin 100 MG capsule Commonly known as:  NEURONTIN Take 100-800 mg by mouth See admin instructions. Take 100 mg by mouth in the morning and take 800 mg by mouth at bedtime   Insulin Glargine 300 UNIT/ML Sopn Commonly known as:  TOUJEO SOLOSTAR Inject 50 Units into the skin 2 (two) times daily. What changed:    how much to take  when to take this   Insulin Glulisine 100 UNIT/ML Solostar Pen Commonly known as:  APIDRA Inject 20 Units into the skin 3 (three) times daily.   levothyroxine 88 MCG tablet Commonly known as:  SYNTHROID, LEVOTHROID Take 88 mcg by mouth daily.   loratadine 10 MG tablet Commonly known as:  CLARITIN Take 10 mg by mouth daily.   Misc. Devices Misc by Does not apply route. C-pap   montelukast 10 MG tablet Commonly known as:  SINGULAIR Take 10 mg by mouth at bedtime.   multivitamin Tabs tablet Take 1 tablet by mouth daily. Start taking on:  09/27/2017   omega-3 acid ethyl esters 1 g capsule Commonly known as:  LOVAZA Take 4 g by mouth at bedtime.   oxyCODONE 5 MG immediate release tablet Commonly known as:  Oxy IR/ROXICODONE Take 1-2 tablets (5-10 mg total) by mouth every 4 (four) hours as needed for moderate pain or severe pain.   OXYGEN Inhale 2 L into the lungs.   telmisartan 20 MG tablet Commonly known as:  MICARDIS Take 20 mg by mouth daily.   temazepam 15 MG  capsule Commonly known as:  RESTORIL Take 1 capsule (15 mg total) by mouth at bedtime as needed for sleep.   traMADol 50 MG tablet Commonly known as:  ULTRAM Take 2 tablets (100 mg total) by mouth 2 (two) times daily.   ULORIC 40 MG tablet Generic drug:  febuxostat Take 40 mg by mouth daily.      Follow-up Information    Judeth Horn, MD. Schedule an appointment as soon as possible for a visit on 10/03/2017.   Specialty:  General Surgery Contact information: [place] [address] [phone]           Signed: Judeth Horn 09/26/2017, 12:46 PM

## 2017-09-27 ENCOUNTER — Telehealth: Payer: Self-pay

## 2017-09-27 NOTE — Telephone Encounter (Signed)
Transition Care Management Follow-Up Telephone Call   Date discharged and where:[facility] on 09/26/2017  How have you been since you were released from the hospital? Weak, bowel movements good-no blood, slight pain   Any patient concerns? No  Items Reviewed:   Meds: Y  Allergies: Y  Dietary Changes Reviewed: Y  Functional Questionnaire:  Independent-I Dependent-D  ADLs:   Dressing- I    Eating-I   Maintaining continence- I   Transferring- I   Transportation- D   Meal Prep- I   Managing Meds- I  Confirmed importance and Date/Time of follow-up visits scheduled: Yes, dr Eulas Post 10/11/17 @ 2:15pm   Confirmed with patient if condition worsens to call PCP or go to the Emergency Dept. Patient was given office number and encouraged to call back with questions or concerns: Yes

## 2017-10-01 ENCOUNTER — Other Ambulatory Visit: Payer: Self-pay | Admitting: General Surgery

## 2017-10-01 MED ORDER — AMPICILLIN 500 MG PO CAPS: 500.0000 mg | ORAL_CAPSULE | Freq: Three times a day (TID) | ORAL | 0 refills | Status: DC

## 2017-10-01 NOTE — Progress Notes (Unsigned)
Patient continues to have UTI symptoms.  Will continue ampicillin for 5 more day.  Crystal [name]. [name], MD, [place] [phone] [facility] Surgery

## 2017-10-03 DIAGNOSIS — R509 Fever, unspecified: Secondary | ICD-10-CM | POA: Diagnosis not present

## 2017-10-03 LAB — CBC AND DIFFERENTIAL
HEMATOCRIT: 25 — AB (ref 36–46)
HEMOGLOBIN: 7.7 — AB (ref 12.0–16.0)
Neutrophils Absolute: 7910
Platelets: 440 — AB (ref 150–399)
WBC: 10

## 2017-10-03 LAB — HEPATIC FUNCTION PANEL
ALT: 11 (ref 7–35)
AST: 10 — AB (ref 13–35)
Alkaline Phosphatase: 244 — AB (ref 25–125)
Bilirubin, Total: 0.6

## 2017-10-03 LAB — BASIC METABOLIC PANEL
BUN: 10 (ref 4–21)
Creatinine: 1.2 — AB (ref 0.5–1.1)
Glucose: 190
POTASSIUM: 4.8 (ref 3.4–5.3)
SODIUM: 138 (ref 137–147)

## 2017-10-11 ENCOUNTER — Encounter: Payer: Self-pay | Admitting: Nurse Practitioner

## 2017-10-11 ENCOUNTER — Ambulatory Visit (INDEPENDENT_AMBULATORY_CARE_PROVIDER_SITE_OTHER): Payer: Medicare Other

## 2017-10-11 ENCOUNTER — Ambulatory Visit (INDEPENDENT_AMBULATORY_CARE_PROVIDER_SITE_OTHER): Payer: Medicare Other | Admitting: Nurse Practitioner

## 2017-10-11 ENCOUNTER — Encounter: Payer: Self-pay | Admitting: Internal Medicine

## 2017-10-11 VITALS — BP 138/64 | HR 84 | Temp 99.0°F | Ht 66.0 in | Wt 183.0 lb

## 2017-10-11 VITALS — BP 138/64 | HR 87 | Temp 99.0°F | Ht 66.0 in | Wt 183.0 lb

## 2017-10-11 DIAGNOSIS — D649 Anemia, unspecified: Secondary | ICD-10-CM | POA: Diagnosis not present

## 2017-10-11 DIAGNOSIS — F329 Major depressive disorder, single episode, unspecified: Secondary | ICD-10-CM

## 2017-10-11 DIAGNOSIS — Z933 Colostomy status: Secondary | ICD-10-CM

## 2017-10-11 DIAGNOSIS — E2839 Other primary ovarian failure: Secondary | ICD-10-CM | POA: Diagnosis not present

## 2017-10-11 DIAGNOSIS — K56609 Unspecified intestinal obstruction, unspecified as to partial versus complete obstruction: Secondary | ICD-10-CM

## 2017-10-11 DIAGNOSIS — G4700 Insomnia, unspecified: Secondary | ICD-10-CM | POA: Diagnosis not present

## 2017-10-11 DIAGNOSIS — F32A Depression, unspecified: Secondary | ICD-10-CM

## 2017-10-11 DIAGNOSIS — Z0000 Encounter for general adult medical examination without abnormal findings: Secondary | ICD-10-CM | POA: Diagnosis not present

## 2017-10-11 MED ORDER — DULOXETINE HCL 30 MG PO CPEP: 30.0000 mg | ORAL_CAPSULE | Freq: Every day | ORAL | 3 refills | Status: DC

## 2017-10-11 MED ORDER — ZOSTER VAC RECOMB ADJUVANTED 50 MCG/0.5ML IM SUSR: 0.5000 mL | Freq: Once | INTRAMUSCULAR | 1 refills | Status: AC

## 2017-10-11 MED ORDER — TETANUS-DIPHTH-ACELL PERTUSSIS 5-2.5-18.5 LF-MCG/0.5 IM SUSP: 0.5000 mL | Freq: Once | INTRAMUSCULAR | 0 refills | Status: AC

## 2017-10-11 MED ORDER — GABAPENTIN 100 MG PO CAPS: 100.0000 mg | ORAL_CAPSULE | ORAL | 1 refills | Status: DC

## 2017-10-11 MED ORDER — GABAPENTIN 800 MG PO TABS: ORAL_TABLET | ORAL | 3 refills | Status: DC

## 2017-10-11 MED ORDER — GABAPENTIN 100 MG PO CAPS: ORAL_CAPSULE | ORAL | 3 refills | Status: DC

## 2017-10-11 NOTE — Progress Notes (Signed)
Subjective:   Crystal [name] is a 67 y.o. female who presents for Medicare Annual (Subsequent) preventive examination.  Last AWV-    Objective:     Vitals: There were no vitals taken for this visit.  There is no height or weight on file to calculate BMI.  Advanced Directives 09/17/2017 09/15/2017 08/22/2017 08/10/2017 07/13/2017 07/05/2017 04/26/2017  Does Patient Have a Medical Advance Directive? Yes Yes Yes Yes Yes Yes -  Type of Advance Directive - Healthcare Power of [place]  Does patient want to make changes to medical advance directive? - No - Patient declined No - Patient declined No - Patient declined No - Patient declined No - Patient declined -  Copy of [place] in Chart? - Yes Yes Yes Yes No - copy requested Yes  Would patient like information on creating a medical advance directive? - - - - - No - Patient declined -    Tobacco Social History   Tobacco Use  Smoking Status Never Smoker  Smokeless Tobacco Never Used     Counseling given: Not Answered   Clinical Intake:                       Past Medical History:  Diagnosis Date  . AICD (automatic cardioverter/defibrillator) present    high RV threshold chronically, device was turned off in 2014; Device battery has been dead x 7 years  . Anemia, iron deficiency   . Angioedema    felt to likely be due to ace inhibitors but says she has had this even off of medicines, appears to be tolerating ARBs chronically  . Anxiety   . Arthritis    "hands, legs, arms" (07/13/2017)  . Asthmatic bronchitis with status asthmaticus   . Bradycardia   . CHF (congestive heart failure) ([place])   . Chronic lower back pain   . Chronic pain   . Chronic renal insufficiency   . CKD (chronic kidney disease), stage III ([place])   . Coronary artery disease    Mild  nonobstructive (30% LAD, 30% RCA) by 09/01/16 cath at [facility]  . Degenerative joint disease   . Depression   . Diabetes mellitus, type 2 ([place])   . Diabetic peripheral neuropathy ([place])   . Dizziness   . Dyspnea on exertion   . Family history of adverse reaction to anesthesia    Mother has nausea  . GERD (gastroesophageal reflux disease)   . Gout   . Heart valve disorder   . History of blood transfusion 1981; ~ 2005; 12/2016   "childbirth; defibrillator OR; colostomy OR"  . History of mononucleosis 03/2014  . Hyperlipidemia   . Hypertension   . Hypothyroid   . Insomnia   . Intervertebral disc degeneration   . Ischemic cardiomyopathy   . LBBB (left bundle branch block)   . Myocardial infarction ([place]) dx'd ~ 2005  . Nonischemic cardiomyopathy ([place])    s/p ICD in 2005. EF has since recovered  . Obesity   . On home oxygen therapy    "2L at night" (07/13/2017)  . OSA on CPAP   . Pneumonia    "couple times; last time was 12/2016" (07/13/2017)  . PONV (postoperative nausea and vomiting)   . Swelling   . Syncope   . Systemic hypertension   . Vitamin D deficiency  Past Surgical History:  Procedure Laterality Date  . APPENDECTOMY  07/13/2017  . BLADDER SUSPENSION  1990   "w/hysterectomy"  . CARDIAC CATHETERIZATION  2005   no obstructive CAD per patient  . CARDIAC CATHETERIZATION  2018  . CARDIAC DEFIBRILLATOR PLACEMENT  2005   BiV ICD implanted,  LV lead is an epicardial lead  . CARPAL TUNNEL RELEASE Left 07/25/2014   Dr.Williamson   . CATARACT EXTRACTION W/ INTRAOCULAR LENS  IMPLANT, BILATERAL Bilateral   . COLONIC STENT PLACEMENT N/A 08/31/2017   Procedure: COLONIC STENT PLACEMENT;  Surgeon: Carol Ada, MD;  Location: [facility];  Service: Endoscopy;  Laterality: N/A;  . COLONOSCOPY     2010-2011 Dr.Kipreos   . COLOSTOMY  12/2016   Archie Endo 01/26/2017  . COLOSTOMY REVERSAL  07/13/2017  . COLOSTOMY REVERSAL N/A 07/13/2017   Procedure: COLOSTOMY REVERSAL;   Surgeon: Judeth Horn, MD;  Location: [facility];  Service: General;  Laterality: N/A;  . CYST REMOVAL HAND Right 05/2013   thumb  . DILATION AND CURETTAGE OF UTERUS  X 5-6  . FLEXIBLE SIGMOIDOSCOPY N/A 08/24/2017   Procedure: FLEXIBLE SIGMOIDOSCOPY;  Surgeon: Carol Ada, MD;  Location: [facility];  Service: Endoscopy;  Laterality: N/A;  . FLEXIBLE SIGMOIDOSCOPY N/A 08/31/2017   Procedure: FLEXIBLE SIGMOIDOSCOPY;  Surgeon: Carol Ada, MD;  Location: [facility];  Service: Endoscopy;  Laterality: N/A;  stent placement  . IMPLANTABLE CARDIOVERTER DEFIBRILLATOR GENERATOR CHANGE  2008  . LAPAROTOMY N/A 09/18/2017   Procedure: EXPLORATORY LAPAROTOMY;  Surgeon: Judeth Horn, MD;  Location: [facility];  Service: General;  Laterality: N/A;  . LYSIS OF ADHESION N/A 09/18/2017   Procedure: LYSIS OF ADHESION;  Surgeon: Judeth Horn, MD;  Location: [facility];  Service: General;  Laterality: N/A;  . PARTIAL COLECTOMY N/A 09/18/2017   Procedure: ILEOCOLECTOMY;  Surgeon: Judeth Horn, MD;  Location: [facility] [facility];  Service: General;  Laterality: N/A;  . SIGMOIDOSCOPY N/A 09/18/2017   Procedure: SIGMOIDOSCOPY;  Surgeon: Judeth Horn, MD;  Location: [place];  Service: General;  Laterality: N/A;  . TOTAL ABDOMINAL HYSTERECTOMY  1990  . TRIGGER FINGER RELEASE Right 05/213  . TUBAL LIGATION  1980s  . VESICOVAGINAL FISTULA CLOSURE W/ TAH     Family History  Problem Relation Age of Onset  . Hypertension Father   . Heart disease Father   . Cancer Father        prostate  . Heart disease Other        (Maternal side) Ischemic heart disease  . Diabetes Mellitus II Other   . Arrhythmia Mother   . Diabetes Mellitus II Mother        Borderline DM  . Hypertension Mother   . Asthma Mother   . Cancer Brother   . Dementia Brother   . Hypertension Brother   . Hypertension Daughter   . Hypertension Daughter   . Diabetes Daughter   . Hypertension Daughter   . Atrial fibrillation Daughter   . GER disease Daughter   . Hypertension  Son   . Anxiety disorder Son   . Hypothyroidism Son    Social History   Socioeconomic History  . Marital status: Widowed    Spouse name: Not on file  . Number of children: Not on file  . Years of education: Not on file  . Highest education level: Not on file  Social Needs  . Financial resource strain: Not on file  . Food insecurity - worry: Not on file  . Food insecurity - inability: Not  on file  . Transportation needs - medical: Not on file  . Transportation needs - non-medical: Not on file  Occupational History    Comment: retired LPN  Tobacco Use  . Smoking status: Never Smoker  . Smokeless tobacco: Never Used  Substance and Sexual Activity  . Alcohol use: No  . Drug use: No  . Sexual activity: No  Other Topics Concern  . Not on file  Social History Narrative   Pt lives in [place] alone.  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      As of 03/15/17:   Diet: 1800 Calorie      Caffeine: Yes      Married, if yes what year: Widowed, married 1973      Do you live in a house, apartment, assisted living, condo, trailer, ect: House, 1 stories, and 1 person      Pets: No      Current/Past profession: LPN, retired       Exercise: Yes, walking          Living Will: Yes   DNR: No   POA/HPOA: Yes      Functional Status:   Do you have difficulty bathing or dressing yourself? No   Do you have difficulty preparing food or eating? No   Do you have difficulty managing your medications? No   Do you have difficulty managing your finances? No   Do you have difficulty affording your medications? Yes    Outpatient Encounter Medications as of 10/11/2017  Medication Sig  . acetaminophen (TYLENOL) 500 MG tablet Take 1,000 mg by mouth every 6 (six) hours as needed for moderate pain or headache.  Mar[place] Kitchen ampicillin (PRINCIPEN) 500 MG capsule Take 1 capsule (500 mg total) by mouth every 8 (eight) hours.  . carvedilol (COREG) 12.5 MG  tablet Take 12.5 mg by mouth 2 (two) times daily with a meal.  . CRESTOR 10 MG tablet Take 1 tablet (10 mg total) by mouth at bedtime.  Marland Kitchen escitalopram (LEXAPRO) 10 MG tablet Take 10 mg by mouth at bedtime.  . ferrous gluconate (FERGON) 324 MG tablet Take 1 tablet (324 mg total) by mouth 3 (three) times daily with meals.  . gabapentin (NEURONTIN) 100 MG capsule Take 100-800 mg by mouth See admin instructions. Take 100 mg by mouth in the morning and take 800 mg by mouth at bedtime  . Insulin Glargine (TOUJEO SOLOSTAR) 300 UNIT/ML SOPN Inject 50 Units into the skin 2 (two) times daily. (Patient taking differently: Inject 100 Units into the skin daily. )  . Insulin Glulisine (APIDRA) 100 UNIT/ML Solostar Pen Inject 20 Units into the skin 3 (three) times daily.  Marland Kitchen levothyroxine (SYNTHROID, LEVOTHROID) 88 MCG tablet Take 88 mcg by mouth daily.   Marland Kitchen loratadine (CLARITIN) 10 MG tablet Take 10 mg by mouth daily.  . Misc. Devices MISC by Does not apply route. C-pap  . montelukast (SINGULAIR) 10 MG tablet Take 10 mg by mouth at bedtime.  . multivitamin (PROSIGHT) TABS tablet Take 1 tablet by mouth daily.  Marland Kitchen omega-3 acid ethyl esters (LOVAZA) 1 g capsule Take 4 g by mouth at bedtime.   Marland Kitchen oxyCODONE (OXY IR/ROXICODONE) 5 MG immediate release tablet Take 1-2 tablets (5-10 mg total) by mouth every 4 (four) hours as needed for moderate pain or severe pain.  . OXYGEN Inhale 2 L into the lungs.  Marland Kitchen telmisartan (MICARDIS) 20 MG tablet Take 20 mg by  mouth daily.  . temazepam (RESTORIL) 15 MG capsule Take 1 capsule (15 mg total) by mouth at bedtime as needed for sleep.  . traMADol (ULTRAM) 50 MG tablet Take 2 tablets (100 mg total) by mouth 2 (two) times daily.  Marland Kitchen ULORIC 40 MG tablet Take 40 mg by mouth daily.    No facility-administered encounter medications on file as of 10/11/2017.     Activities of Daily Living In your present state of health, do you have any difficulty performing the following activities:  09/24/2017 09/24/2017  Hearing? N N  Vision? N N  Difficulty concentrating or making decisions? N N  Comment - -  Walking or climbing stairs? N N  Dressing or bathing? N N  Doing errands, shopping? N -  Some recent data might be hidden    Patient Care Team: Gildardo Cranker, DO as PCP - General (Internal Medicine) Phineas Inches, MD as Consulting Physician (Nephrology) Judeth Horn, MD as Consulting Physician (General Surgery)    Assessment:   This is a routine wellness examination for [place].  Exercise Activities and Dietary recommendations    Goals    None      Fall Risk Fall Risk  08/22/2017 08/17/2017 04/14/2017 03/15/2017 03/13/2017  Falls in the past year? No No No No No   Is the patient's home free of loose throw rugs in walkways, pet beds, electrical cords, etc?   yes      Grab bars in the bathroom? yes      Handrails on the stairs?   yes      Adequate lighting?   yes  Depression Screen PHQ 2/9 Scores 03/15/2017 03/13/2017  PHQ - 2 Score 0 0  PHQ- 9 Score - 2     Cognitive Function        Immunization History  Administered Date(s) Administered  . Influenza,inj,Quad PF,6+ Mos 04/26/2017  . Influenza-Unspecified 05/11/2016  . PPD Test 06/10/2015  . Pneumococcal Conjugate-13 11/17/2011  . Pneumococcal Polysaccharide-23 05/07/2004, 07/14/2017  . Td 09/19/2001  . Zoster 05/24/2012    Qualifies for Shingles Vaccine? Yes, educated and prescription sent to pharmacy  Screening Tests Health Maintenance  Topic Date Due  . Hepatitis C Screening  1950/09/09  . OPHTHALMOLOGY EXAM  12/01/1959  . COLONOSCOPY  12/01/1999  . TETANUS/TDAP  09/20/2011  . DEXA SCAN  12/01/2014  . MAMMOGRAM  10/18/2017  . HEMOGLOBIN A1C  01/03/2018  . FOOT EXAM  04/26/2018  . INFLUENZA VACCINE  Completed  . PNA vac Low Risk Adult  Completed    Cancer Screenings: Lung: Low Dose CT Chest recommended if Age 67-80 years, 30 pack-year currently smoking OR have quit w/in 15years.  Patient does not qualify. Breast:  Up to date on Mammogram? Yes   Up to date of Bone Density/Dexa? No, ordered Colorectal: up to date  Additional Screenings:  Hepatitis C Screening: declined TDAP due-prescription sent to pharmacy Shingrix due-prescription sent to pharmacy     Plan:    I have personally reviewed and addressed the Medicare Annual Wellness questionnaire and have noted the following in the patient's chart:  A. Medical and social history B. Use of alcohol, tobacco or illicit drugs  C. Current medications and supplements D. Functional ability and status E.  Nutritional status F.  Physical activity G. Advance directives H. List of other physicians I.  Hospitalizations, surgeries, and ER visits in previous 12 months J.  [place] [place] to include hearing, vision, cognitive, depression L. Referrals and appointments -  none  In addition, I have reviewed and discussed with patient certain preventive protocols, quality metrics, and best practice recommendations. A written personalized care plan for preventive services as well as general preventive health recommendations were provided to patient.  See attached scanned questionnaire for additional information.   Signed,   Tyson Dense, RN Nurse Health Advisor  Patient Concerns: Pt requested PRN depression medication.

## 2017-10-11 NOTE — Progress Notes (Signed)
Careteam: Patient Care Team: Gildardo Cranker, DO as PCP - General (Internal Medicine) Phineas Inches, MD as Consulting Physician (Nephrology) Judeth Horn, MD as Consulting Physician (General Surgery)  Advanced Directive information    Allergies  Allergen Reactions  . Glucophage [Metformin Hcl] Other (See Comments)    Renal failure  . Ace Inhibitors Swelling and Other (See Comments)    Angioedema  . Advicor [Niacin-Lovastatin Er] Other (See Comments)    Muscle aches  . Bystolic [Nebivolol Hcl] Other (See Comments)    Edema  . Erythromycin Diarrhea, Nausea And Vomiting and Other (See Comments)    *DERIVATIVES*  . Lipitor [Atorvastatin] Other (See Comments)    Muscle aches  . Lopid [Gemfibrozil] Other (See Comments)    Muscle aches  . Statins Other (See Comments)    Muscle aches  . Telmisartan     AVOID ARB/ ACEi in this patient due to recurrent AKI  . Adhesive [Tape] Rash    Chief Complaint  Patient presents with  . Transitions Of Care    Pt was recently hospitalized at [facility] 09/15/17 to 09/26/17 due to colonic obstruction.      HPI: Patient is a 67 y.o. female seen in the office today due to hospital follow up.  Pt was hospitalized several times after colostomy reversal, most recently  due to large bowel obstruction again.  This is been treated conservatively with flexible sigmoidoscopy and stent placement previously, but the stent fell out and the patient required surgery. The patient underwent surgery on February 25 at which time she underwent a resection of a right colon and ileal rectal anastomosis.  She had a slow recovery but doing well currently. She was seen on March 12th by her surgeon. He followed up labs but these are not in epic. Reports she had cbc and bmp done. Has another follow up in 1 week.  Appetite increasing. Eating and drinking better.  Moving bowels about 6 times a day.   Feeling down after going through so much, recently took her daughters  clonopin. Feels like lexapro is not helping at all and would like to change to something different. Has also taken celexa.  Usually not a depressed person but has been through a lot and worries about not getting better.   Increased weakness, hgb low when she left hospital. Taking iron supplements. Getting better every day.   Needing refill on gabapentin - takes 100 mg daily in the morning and 800 mg qhs due to neuropathy  Review of Systems:  Review of Systems  Constitutional: Negative for chills, fever and malaise/fatigue.  Respiratory: Negative for cough and shortness of breath.   Cardiovascular: Negative for chest pain and leg swelling.  Gastrointestinal: Positive for abdominal pain (improving). Negative for constipation, diarrhea, nausea and vomiting.  Genitourinary: Negative for dysuria and urgency.  Musculoskeletal: Negative for myalgias.  Neurological: Positive for weakness. Negative for dizziness and headaches.    Past Medical History:  Diagnosis Date  . AICD (automatic cardioverter/defibrillator) present    high RV threshold chronically, device was turned off in 2014; Device battery has been dead x 7 years  . Anemia, iron deficiency   . Angioedema    felt to likely be due to ace inhibitors but says she has had this even off of medicines, appears to be tolerating ARBs chronically  . Anxiety   . Arthritis    "hands, legs, arms" (07/13/2017)  . Asthmatic bronchitis with status asthmaticus   . Bradycardia   .  CHF (congestive heart failure) ([place])   . Chronic lower back pain   . Chronic pain   . Chronic renal insufficiency   . CKD (chronic kidney disease), stage III ([place])   . Coronary artery disease    Mild nonobstructive (30% LAD, 30% RCA) by 09/01/16 cath at [facility]  . Degenerative joint disease   . Depression   . Diabetes mellitus, type 2 ([place])   . Diabetic peripheral neuropathy ([place])   . Dizziness   . Dyspnea on exertion   . Family history of adverse reaction  to anesthesia    Mother has nausea  . GERD (gastroesophageal reflux disease)   . Gout   . Heart valve disorder   . History of blood transfusion 1981; ~ 2005; 12/2016   "childbirth; defibrillator OR; colostomy OR"  . History of mononucleosis 03/2014  . Hyperlipidemia   . Hypertension   . Hypothyroid   . Insomnia   . Intervertebral disc degeneration   . Ischemic cardiomyopathy   . LBBB (left bundle branch block)   . Myocardial infarction ([place]) dx'd ~ 2005  . Nonischemic cardiomyopathy ([place])    s/p ICD in 2005. EF has since recovered  . Obesity   . On home oxygen therapy    "2L at night" (07/13/2017)  . OSA on CPAP   . Pneumonia    "couple times; last time was 12/2016" (07/13/2017)  . PONV (postoperative nausea and vomiting)   . Swelling   . Syncope   . Systemic hypertension   . Vitamin D deficiency    Past Surgical History:  Procedure Laterality Date  . APPENDECTOMY  07/13/2017  . BLADDER SUSPENSION  1990   "w/hysterectomy"  . CARDIAC CATHETERIZATION  2005   no obstructive CAD per patient  . CARDIAC CATHETERIZATION  2018  . CARDIAC DEFIBRILLATOR PLACEMENT  2005   BiV ICD implanted,  LV lead is an epicardial lead  . CARPAL TUNNEL RELEASE Left 07/25/2014   Dr.Williamson   . CATARACT EXTRACTION W/ INTRAOCULAR LENS  IMPLANT, BILATERAL Bilateral   . COLONIC STENT PLACEMENT N/A 08/31/2017   Procedure: COLONIC STENT PLACEMENT;  Surgeon: Carol Ada, MD;  Location: [place];  Service: Endoscopy;  Laterality: N/A;  . COLONOSCOPY     2010-2011 Dr.Kipreos   . COLOSTOMY  12/2016   Archie Endo 01/26/2017  . COLOSTOMY REVERSAL  07/13/2017  . COLOSTOMY REVERSAL N/A 07/13/2017   Procedure: COLOSTOMY REVERSAL;  Surgeon: Judeth Horn, MD;  Location: [place];  Service: General;  Laterality: N/A;  . CYST REMOVAL HAND Right 05/2013   thumb  . DILATION AND CURETTAGE OF UTERUS  X 5-6  . FLEXIBLE SIGMOIDOSCOPY N/A 08/24/2017   Procedure: FLEXIBLE SIGMOIDOSCOPY;  Surgeon: Carol Ada, MD;   Location: [place];  Service: Endoscopy;  Laterality: N/A;  . FLEXIBLE SIGMOIDOSCOPY N/A 08/31/2017   Procedure: FLEXIBLE SIGMOIDOSCOPY;  Surgeon: Carol Ada, MD;  Location: [place];  Service: Endoscopy;  Laterality: N/A;  stent placement  . IMPLANTABLE CARDIOVERTER DEFIBRILLATOR GENERATOR CHANGE  2008  . LAPAROTOMY N/A 09/18/2017   Procedure: EXPLORATORY LAPAROTOMY;  Surgeon: Judeth Horn, MD;  Location: [place];  Service: General;  Laterality: N/A;  . LYSIS OF ADHESION N/A 09/18/2017   Procedure: LYSIS OF ADHESION;  Surgeon: Judeth Horn, MD;  Location: [place];  Service: General;  Laterality: N/A;  . PARTIAL COLECTOMY N/A 09/18/2017   Procedure: ILEOCOLECTOMY;  Surgeon: Judeth Horn, MD;  Location: [place];  Service: General;  Laterality: N/A;  . PARTIAL COLECTOMY  09/25/2017  .  SIGMOIDOSCOPY N/A 09/18/2017   Procedure: SIGMOIDOSCOPY;  Surgeon: Judeth Horn, MD;  Location: [place];  Service: General;  Laterality: N/A;  . TOTAL ABDOMINAL HYSTERECTOMY  1990  . TRIGGER FINGER RELEASE Right 05/213  . TUBAL LIGATION  1980s  . VESICOVAGINAL FISTULA CLOSURE W/ TAH     Social History:   reports that  has never smoked. she has never used smokeless tobacco. She reports that she does not drink alcohol or use drugs.  Family History  Problem Relation Age of Onset  . Hypertension Father   . Heart disease Father   . Cancer Father        prostate  . Heart disease Other        (Maternal side) Ischemic heart disease  . Diabetes Mellitus II Other   . Arrhythmia Mother   . Diabetes Mellitus II Mother        Borderline DM  . Hypertension Mother   . Asthma Mother   . Cancer Brother   . Dementia Brother   . Hypertension Brother   . Hypertension Daughter   . Hypertension Daughter   . Diabetes Daughter   . Hypertension Daughter   . Atrial fibrillation Daughter   . GER disease Daughter   . Hypertension Son   . Anxiety disorder Son   . Hypothyroidism Son     Medications: Patient's  Medications  New Prescriptions   No medications on file  Previous Medications   ACETAMINOPHEN (TYLENOL) 500 MG TABLET    Take 1,000 mg by mouth every 6 (six) hours as needed for moderate pain or headache.   CARVEDILOL (COREG) 12.5 MG TABLET    Take 12.5 mg by mouth 2 (two) times daily with a meal.   CRESTOR 10 MG TABLET    Take 1 tablet (10 mg total) by mouth at bedtime.   ESCITALOPRAM (LEXAPRO) 10 MG TABLET    Take 10 mg by mouth at bedtime.   FERROUS GLUCONATE (FERGON) 324 MG TABLET    Take 1 tablet (324 mg total) by mouth 3 (three) times daily with meals.   GABAPENTIN (NEURONTIN) 100 MG CAPSULE    Take 100-800 mg by mouth See admin instructions. Take 100 mg by mouth in the morning and take 800 mg by mouth at bedtime   INSULIN GLARGINE (TOUJEO SOLOSTAR) 300 UNIT/ML SOPN    Inject 50 Units into the skin 2 (two) times daily.   INSULIN GLULISINE (APIDRA) 100 UNIT/ML SOLOSTAR PEN    Inject 20 Units into the skin 3 (three) times daily.   LEVOTHYROXINE (SYNTHROID, LEVOTHROID) 88 MCG TABLET    Take 88 mcg by mouth daily.    LORATADINE (CLARITIN) 10 MG TABLET    Take 10 mg by mouth daily.   MISC. DEVICES MISC    by Does not apply route. C-pap   MONTELUKAST (SINGULAIR) 10 MG TABLET    Take 10 mg by mouth at bedtime.   MULTIVITAMIN (PROSIGHT) TABS TABLET    Take 1 tablet by mouth daily.   OMEGA-3 ACID ETHYL ESTERS (LOVAZA) 1 G CAPSULE    Take 4 g by mouth at bedtime.    OXYCODONE (OXY IR/ROXICODONE) 5 MG IMMEDIATE RELEASE TABLET    Take 1-2 tablets (5-10 mg total) by mouth every 4 (four) hours as needed for moderate pain or severe pain.   OXYGEN    Inhale 2 L into the lungs.   TEMAZEPAM (RESTORIL) 15 MG CAPSULE    Take 1 capsule (15 mg total) by mouth at  bedtime as needed for sleep.   ULORIC 40 MG TABLET    Take 40 mg by mouth daily.   Modified Medications   No medications on file  Discontinued Medications   No medications on file     Physical Exam:  Vitals:   10/11/17 1315  BP: 138/64    Pulse: 87  Temp: 99 F (37.2 C)  TempSrc: Oral  SpO2: 98%  Weight: 183 lb (83 kg)  Height: 5\' 6"  (1.676 m)   There is no height or weight on file to calculate BMI.  Physical Exam  Constitutional: She is oriented to person, place, and time. She appears well-developed and well-nourished.  HENT:  Head: Normocephalic and atraumatic.  Neck: Carotid bruit is not present.  Cardiovascular: Normal rate, regular rhythm and normal heart sounds. Exam reveals no gallop and no friction rub.  No murmur heard. Pulmonary/Chest: Effort normal and breath sounds normal. No respiratory distress. She has no wheezes. She has no rales.  Abdominal: Soft. Normal appearance and bowel sounds are normal. She exhibits distension. She exhibits no mass. There is no hepatomegaly. There is tenderness. There is no rigidity, no rebound and no guarding. No hernia.  Surgical incisional scar healing well with small opening at bottom of incision with drainage and mild tenderness, surgery following. No changes in color, consistency of  Drainage. No odor noted.   Musculoskeletal: She exhibits edema.  Neurological: She is alert and oriented to person, place, and time.  Skin: Skin is warm and dry. No rash noted.  Psychiatric: She has a normal mood and affect. Her behavior is normal. Judgment and thought content normal.    Labs reviewed: Basic Metabolic Panel: Recent Labs    03/15/17 1226  08/29/17 0546  08/31/17 0355 09/01/17 0304 09/02/17 0409  09/23/17 0432 09/24/17 0448 09/25/17 0541  NA 136   < > 138   < > 134* 135 135   < > 137 134* 136  K 4.3   < > 3.8   < > 4.4 4.3 4.7   < > 3.8 3.6 3.3*  CL 99   < > 103   < > 101 103 103   < > 107 103 107  CO2 25   < > 24   < > 22 22 23    < > 21* 21* 22  GLUCOSE 126*   < > 136*   < > 153* 163* 202*   < > 95 139* 145*  BUN 19   < > 7   < > 22* 26* 25*   < > 8 <5* <5*  CREATININE 1.32*   < > 1.29*   < > 1.30* 1.31* 1.23*   < > 1.04* 0.98 0.97  CALCIUM 9.4   < > 9.0   < >  9.1 9.2 9.1   < > 7.8* 7.8* 7.8*  MG  --    < > 1.5*   < > 1.7 2.0 2.2  --   --   --   --   PHOS  --    < > 3.7  --  4.1  --  4.4  --   --   --   --   TSH 3.30  --   --   --   --   --   --   --   --   --   --    < > = values in this interval not displayed.   Liver Function Tests: Recent Labs  08/29/17 0546 08/31/17 0355 09/15/17 1629  AST 12* 12* 19  ALT 8* 8* 16  ALKPHOS 67 71 104  BILITOT 0.8 0.6 1.1  PROT 6.3* 6.5 8.5*  ALBUMIN 3.0* 3.1* 4.2   Recent Labs    01/26/17 1400 08/22/17 1053 09/15/17 1629  LIPASE 37 37 35  AMYLASE 20*  --   --    No results for input(s): AMMONIA in the last 8760 hours. CBC: Recent Labs    09/21/17 0431 09/22/17 0402 09/23/17 0432 09/24/17 0448 09/25/17 0541  WBC 10.6* 9.1 7.9 8.6 8.9  NEUTROABS 7.9* 6.3 5.2  --   --   HGB 7.3* 8.1* 7.8* 8.9* 8.4*  HCT 23.6* 25.1* 23.9* 27.1* 25.7*  MCV 82.5 82.0 81.8 80.4 80.6  PLT 225 238 271 310 328   Lipid Panel: Recent Labs    01/27/17 0505 04/26/17 1639 08/29/17 0552  CHOL  --  176  --   HDL  --  36*  --   LDLCALC  --  102*  --   TRIG 515* 263* 218*  CHOLHDL  --  4.9  --    TSH: Recent Labs    03/15/17 1226  TSH 3.30   A1C: Lab Results  Component Value Date   HGBA1C 8.4 (H) 07/05/2017     Assessment/Plan 1. Intestinal obstruction, unspecified cause, unspecified whether partial or complete [facility]) S/p resection of a right colon and ileal rectal anastomosis. Slow recovery post-op however increase in appetite and strength daily, Labs have been followed by surgery, pt to request these to be sent to our office. Ongoing incisional follow up by surgery; next appt 10/17/17  2. S/P colostomy ([place]) -s/p reversal with multiple re-hospitalization due to colonic and bowel obstruction  See number 1   3. Depression, unspecified depression type With anxiety due to multiple hospitalization from bowel obstructions. Advised again clonopin due to side effects and interactions with other  medications she is taking. Not having anxiety attack just having overall depression and anxiety with trouble sleeping. Will stop lexapro and start cymbalta  - DULoxetine (CYMBALTA) 30 MG capsule; Take 1 capsule (30 mg total) by mouth daily.  Dispense: 30 capsule; Refill: 3  4. Anemia, unspecified type Continues on iron supplement, labs being follow up by surgery, request these labs be sent to our office, may also have additional lab work done next week at follow up  5. Insomnia, unspecified type -melatonin 3 mg by mouth at bedtime nightly, already taking gabapentin 800 mg qhs and temazepam 15 mg qhs sleep  Next appt: 6 weeks for follow up with Dr Thayer Headings K. [place], [facility] [phone]

## 2017-10-11 NOTE — Patient Instructions (Signed)
Crystal [name] , Thank you for taking time to come for your Medicare Wellness Visit. I appreciate your ongoing commitment to your health goals. Please review the following plan we discussed and let me know if I can assist you in the future.   Screening recommendations/referrals: Colonoscopy up to date, due 07/25/2026 Mammogram up to date, due 04/20/2019 Bone Density due, ordered Recommended yearly ophthalmology/optometry visit for glaucoma screening and checkup Recommended yearly dental visit for hygiene and checkup  Vaccinations: Influenza vaccine up to date, due 2019 fall season Pneumococcal vaccine up to date, completed Tdap vaccine up due, prescription sent to pharmacy Shingles vaccine due, prescription sent to pharmacy    Advanced directives: in chart  Conditions/risks identified: none  Next appointment: Tyson Dense, RN 10/15/2018 @ 2:30pm   Preventive Care 68 Years and Older, Female Preventive care refers to lifestyle choices and visits with your health care provider that can promote health and wellness. What does preventive care include?  A yearly physical exam. This is also called an annual well check.  Dental exams once or twice a year.  Routine eye exams. Ask your health care provider how often you should have your eyes checked.  Personal lifestyle choices, including:  Daily care of your teeth and gums.  Regular physical activity.  Eating a healthy diet.  Avoiding tobacco and drug use.  Limiting alcohol use.  Practicing safe sex.  Taking low-dose aspirin every day.  Taking vitamin and mineral supplements as recommended by your health care provider. What happens during an annual well check? The services and screenings done by your health care provider during your annual well check will depend on your age, overall health, lifestyle risk factors, and family history of disease. Counseling  Your health care provider may ask you questions about your:  Alcohol  use.  Tobacco use.  Drug use.  Emotional well-being.  Home and relationship well-being.  Sexual activity.  Eating habits.  History of falls.  Memory and ability to understand (cognition).  Work and work Statistician.  Reproductive health. Screening  You may have the following tests or measurements:  Height, weight, and BMI.  Blood pressure.  Lipid and cholesterol levels. These may be checked every 5 years, or more frequently if you are over 59 years old.  Skin check.  Lung cancer screening. You may have this screening every year starting at age 92 if you have a 30-pack-year history of smoking and currently smoke or have quit within the past 15 years.  Fecal occult blood test (FOBT) of the stool. You may have this test every year starting at age 59.  Flexible sigmoidoscopy or colonoscopy. You may have a sigmoidoscopy every 5 years or a colonoscopy every 10 years starting at age 76.  Hepatitis C blood test.  Hepatitis B blood test.  Sexually transmitted disease (STD) testing.  Diabetes screening. This is done by checking your blood sugar (glucose) after you have not eaten for a while (fasting). You may have this done every 1-3 years.  Bone density scan. This is done to screen for osteoporosis. You may have this done starting at age 18.  Mammogram. This may be done every 1-2 years. Talk to your health care provider about how often you should have regular mammograms. Talk with your health care provider about your test results, treatment options, and if necessary, the need for more tests. Vaccines  Your health care provider may recommend certain vaccines, such as:  Influenza vaccine. This is recommended every year.  Tetanus,  diphtheria, and acellular pertussis (Tdap, Td) vaccine. You may need a Td booster every 10 years.  Zoster vaccine. You may need this after age 37.  Pneumococcal 13-valent conjugate (PCV13) vaccine. One dose is recommended after age  61.  Pneumococcal polysaccharide (PPSV23) vaccine. One dose is recommended after age 79. Talk to your health care provider about which screenings and vaccines you need and how often you need them. This information is not intended to replace advice given to you by your health care provider. Make sure you discuss any questions you have with your health care provider. Document Released: 08/07/2015 Document Revised: 03/30/2016 Document Reviewed: 05/12/2015 Elsevier Interactive Patient Education  2017 [place] Prevention in the Home Falls can cause injuries. They can happen to people of all ages. There are many things you can do to make your home safe and to help prevent falls. What can I do on the outside of my home?  Regularly fix the edges of walkways and driveways and fix any cracks.  Remove anything that might make you trip as you walk through a door, such as a raised step or threshold.  Trim any bushes or trees on the path to your home.  Use bright outdoor lighting.  Clear any walking paths of anything that might make someone trip, such as rocks or tools.  Regularly check to see if handrails are loose or broken. Make sure that both sides of any steps have handrails.  Any raised decks and porches should have guardrails on the edges.  Have any leaves, snow, or ice cleared regularly.  Use sand or salt on walking paths during winter.  Clean up any spills in your garage right away. This includes oil or grease spills. What can I do in the bathroom?  Use night lights.  Install grab bars by the toilet and in the tub and shower. Do not use towel bars as grab bars.  Use non-skid mats or decals in the tub or shower.  If you need to sit down in the shower, use a plastic, non-slip stool.  Keep the floor dry. Clean up any water that spills on the floor as soon as it happens.  Remove soap buildup in the tub or shower regularly.  Attach bath mats securely with double-sided  non-slip rug tape.  Do not have throw rugs and other things on the floor that can make you trip. What can I do in the bedroom?  Use night lights.  Make sure that you have a light by your bed that is easy to reach.  Do not use any sheets or blankets that are too big for your bed. They should not hang down onto the floor.  Have a firm chair that has side arms. You can use this for support while you get dressed.  Do not have throw rugs and other things on the floor that can make you trip. What can I do in the kitchen?  Clean up any spills right away.  Avoid walking on wet floors.  Keep items that you use a lot in easy-to-reach places.  If you need to reach something above you, use a strong step stool that has a grab bar.  Keep electrical cords out of the way.  Do not use floor polish or wax that makes floors slippery. If you must use wax, use non-skid floor wax.  Do not have throw rugs and other things on the floor that can make you trip. What can I do with  my stairs?  Do not leave any items on the stairs.  Make sure that there are handrails on both sides of the stairs and use them. Fix handrails that are broken or loose. Make sure that handrails are as long as the stairways.  Check any carpeting to make sure that it is firmly attached to the stairs. Fix any carpet that is loose or worn.  Avoid having throw rugs at the top or bottom of the stairs. If you do have throw rugs, attach them to the floor with carpet tape.  Make sure that you have a light switch at the top of the stairs and the bottom of the stairs. If you do not have them, ask someone to add them for you. What else can I do to help prevent falls?  Wear shoes that:  Do not have high heels.  Have rubber bottoms.  Are comfortable and fit you well.  Are closed at the toe. Do not wear sandals.  If you use a stepladder:  Make sure that it is fully opened. Do not climb a closed stepladder.  Make sure that both  sides of the stepladder are locked into place.  Ask someone to hold it for you, if possible.  Clearly mark and make sure that you can see:  Any grab bars or handrails.  First and last steps.  Where the edge of each step is.  Use tools that help you move around (mobility aids) if they are needed. These include:  Canes.  Walkers.  Scooters.  Crutches.  Turn on the lights when you go into a dark area. Replace any light bulbs as soon as they burn out.  Set up your furniture so you have a clear path. Avoid moving your furniture around.  If any of your floors are uneven, fix them.  If there are any pets around you, be aware of where they are.  Review your medicines with your doctor. Some medicines can make you feel dizzy. This can increase your chance of falling. Ask your doctor what other things that you can do to help prevent falls. This information is not intended to replace advice given to you by your health care provider. Make sure you discuss any questions you have with your health care provider. Document Released: 05/07/2009 Document Revised: 12/17/2015 Document Reviewed: 08/15/2014 Elsevier Interactive Patient Education  2017 [employer].

## 2017-10-11 NOTE — Patient Instructions (Addendum)
Stop lexapro, start cymbalta 30 mg daily  Follow up with Dr Eulas Post in 6 weeks for routine follow up, medication changes, anemia

## 2017-10-12 ENCOUNTER — Encounter: Payer: Medicare Other | Admitting: Nurse Practitioner

## 2017-10-12 ENCOUNTER — Telehealth: Payer: Self-pay | Admitting: *Deleted

## 2017-10-12 DIAGNOSIS — F329 Major depressive disorder, single episode, unspecified: Secondary | ICD-10-CM

## 2017-10-12 DIAGNOSIS — F32A Depression, unspecified: Secondary | ICD-10-CM

## 2017-10-12 MED ORDER — DULOXETINE HCL 30 MG PO CPEP: 30.0000 mg | ORAL_CAPSULE | Freq: Every day | ORAL | 3 refills | Status: DC

## 2017-10-12 NOTE — Telephone Encounter (Signed)
Thank you :)

## 2017-10-12 NOTE — Telephone Encounter (Signed)
Patient called and stated that she saw Jessica yesterday and she was going to fax Cymbalta to her pharmacy. Stated that she went to the pharmacy and they had no Rx.  I reviewed Chart and medication was "NO Print" and didn't go to pharmacy.  Faxed Rx

## 2017-10-25 DIAGNOSIS — G4730 Sleep apnea, unspecified: Secondary | ICD-10-CM | POA: Diagnosis not present

## 2017-10-25 DIAGNOSIS — J45902 Unspecified asthma with status asthmaticus: Secondary | ICD-10-CM | POA: Diagnosis not present

## 2017-10-25 DIAGNOSIS — I509 Heart failure, unspecified: Secondary | ICD-10-CM | POA: Diagnosis not present

## 2017-11-13 ENCOUNTER — Ambulatory Visit
Admission: RE | Admit: 2017-11-13 | Discharge: 2017-11-13 | Disposition: A | Discharge status: Home / Self Care | Payer: Medicare Other | Source: Ambulatory Visit | Attending: Nurse Practitioner | Admitting: Nurse Practitioner

## 2017-11-13 DIAGNOSIS — E2839 Other primary ovarian failure: Secondary | ICD-10-CM

## 2017-11-13 DIAGNOSIS — M85852 Other specified disorders of bone density and structure, left thigh: Secondary | ICD-10-CM | POA: Diagnosis not present

## 2017-11-22 ENCOUNTER — Encounter: Payer: Self-pay | Admitting: Internal Medicine

## 2017-11-22 ENCOUNTER — Ambulatory Visit (INDEPENDENT_AMBULATORY_CARE_PROVIDER_SITE_OTHER): Payer: Medicare Other | Admitting: Internal Medicine

## 2017-11-22 VITALS — BP 118/68 | HR 86 | Temp 98.4°F | Ht 66.0 in | Wt 203.0 lb

## 2017-11-22 DIAGNOSIS — Z9289 Personal history of other medical treatment: Secondary | ICD-10-CM | POA: Diagnosis not present

## 2017-11-22 DIAGNOSIS — E034 Atrophy of thyroid (acquired): Secondary | ICD-10-CM | POA: Diagnosis not present

## 2017-11-22 DIAGNOSIS — E1122 Type 2 diabetes mellitus with diabetic chronic kidney disease: Secondary | ICD-10-CM | POA: Insufficient documentation

## 2017-11-22 DIAGNOSIS — M1A09X0 Idiopathic chronic gout, multiple sites, without tophus (tophi): Secondary | ICD-10-CM

## 2017-11-22 DIAGNOSIS — Z1159 Encounter for screening for other viral diseases: Secondary | ICD-10-CM

## 2017-11-22 DIAGNOSIS — D649 Anemia, unspecified: Secondary | ICD-10-CM

## 2017-11-22 DIAGNOSIS — E785 Hyperlipidemia, unspecified: Secondary | ICD-10-CM

## 2017-11-22 DIAGNOSIS — Z794 Long term (current) use of insulin: Secondary | ICD-10-CM | POA: Diagnosis not present

## 2017-11-22 DIAGNOSIS — Z9049 Acquired absence of other specified parts of digestive tract: Secondary | ICD-10-CM | POA: Diagnosis not present

## 2017-11-22 DIAGNOSIS — N183 Chronic kidney disease, stage 3 (moderate): Secondary | ICD-10-CM | POA: Diagnosis not present

## 2017-11-22 DIAGNOSIS — J9601 Acute respiratory failure with hypoxia: Secondary | ICD-10-CM | POA: Insufficient documentation

## 2017-11-22 DIAGNOSIS — I10 Essential (primary) hypertension: Secondary | ICD-10-CM

## 2017-11-22 DIAGNOSIS — Z1231 Encounter for screening mammogram for malignant neoplasm of breast: Secondary | ICD-10-CM | POA: Diagnosis not present

## 2017-11-22 DIAGNOSIS — E1169 Type 2 diabetes mellitus with other specified complication: Secondary | ICD-10-CM | POA: Diagnosis not present

## 2017-11-22 DIAGNOSIS — Z1239 Encounter for other screening for malignant neoplasm of breast: Secondary | ICD-10-CM

## 2017-11-22 DIAGNOSIS — Z9189 Other specified personal risk factors, not elsewhere classified: Secondary | ICD-10-CM | POA: Diagnosis not present

## 2017-11-22 LAB — CBC WITH DIFFERENTIAL/PLATELET
BASOS ABS: 31 {cells}/uL (ref 0–200)
Basophils Relative: 0.4 %
EOS PCT: 5 %
Eosinophils Absolute: 385 cells/uL (ref 15–500)
HCT: 35.8 % (ref 35.0–45.0)
Hemoglobin: 11.6 g/dL — ABNORMAL LOW (ref 11.7–15.5)
Lymphs Abs: 2256 cells/uL (ref 850–3900)
MCH: 26.6 pg — ABNORMAL LOW (ref 27.0–33.0)
MCHC: 32.4 g/dL (ref 32.0–36.0)
MCV: 82.1 fL (ref 80.0–100.0)
MONOS PCT: 8.5 %
MPV: 10.9 fL (ref 7.5–12.5)
Neutro Abs: 4374 cells/uL (ref 1500–7800)
Neutrophils Relative %: 56.8 %
Platelets: 233 10*3/uL (ref 140–400)
RBC: 4.36 10*6/uL (ref 3.80–5.10)
RDW: 16.3 % — ABNORMAL HIGH (ref 11.0–15.0)
Total Lymphocyte: 29.3 %
WBC mixed population: 655 cells/uL (ref 200–950)
WBC: 7.7 10*3/uL (ref 3.8–10.8)

## 2017-11-22 LAB — TSH: TSH: 0.41 mIU/L (ref 0.40–4.50)

## 2017-11-22 LAB — T4, FREE: FREE T4: 1.4 ng/dL (ref 0.8–1.8)

## 2017-11-22 MED ORDER — ALLOPURINOL 100 MG PO TABS: 100.0000 mg | ORAL_TABLET | Freq: Every day | ORAL | 1 refills | Status: DC

## 2017-11-22 NOTE — Patient Instructions (Signed)
Will call with lab results  Follow up with eye provider for annual eye exam  Continue current medications as ordered  Follow up in 3 mos for DM, HTN, cholesterol and thyroid

## 2017-11-22 NOTE — Progress Notes (Signed)
Patient ID: Crystal [name], female   DOB: 01/26/1951, 67 y.o.   MRN: [id number]   Location:  [facility] OFFICE  Provider: DR Arletha Grippe  Code Status: FULL CODE Goals of Care:  Advanced Directives 10/11/2017  Does Patient Have a Medical Advance Directive? Yes  Type of Advance Directive [place]  Does patient want to make changes to medical advance directive? No - Patient declined  Copy of [place] in Chart? Yes  Would patient like information on creating a medical advance directive? -     Chief Complaint  Patient presents with  . Medical Management of Chronic Issues    6 week follow-up on anemia, question when lipid to be rechecked (last checked 04/2017). MALB due   . Health Maintenance    Mammogram order pending, patient will set kup appt with eye doctor, discuss need for colonoscopy  . Best Practice Recommendations    Hep C screening- patient with history of blood transfusions     HPI: Patient is a 67 y.o. female seen today for medical management of chronic diseases.  She is s/p reversal of colostomy 07/13/17 by Dr Hulen Skains followed complication of AKI and dehydration that req'd ED and subsequent hospitalization 08/10/17 -08/13/17. She presented to our office with N/V and gastroenteritis shortly after hospital d/c. She was readmitted 08/22/17-09/04/17 with intractable N/V, dehydration, hypotension, hyperkalemia. Intestinal obstruction found and stent placed on 08/31/17 with flex sig procedure. She felt better but then N/V/abdominal pain returned. She was readmitted with colonic obstruction, dehydration and AKI. She was taken to the OR  On 09/18/17 for exploratory laparotomy and was dx with colonic stricture with obstruction and partial colectomy performed. She did well and has had no further issues. She has 6-7 loose BMs per day but no abdominal pain, N/V. No f/c. Some bloating.  HTN - BP stable on coreg alone. Off micardis. She was on valsartan prior to  hospital stay but med stopped 2/2 hypotension.  CHF - she has had an AICD in the past but EF improved from <30% --> EF 45-50% with mild-mod LVH and grade 1 DD in 2016-04-22. Battery in AICD died and she was told it was not necessary to replace it due to improved EF. She underwent lexiscan stress test, cardiac cath and 2D echo this yr. She takes coreg  Hyperlipidemia - stable on crestor. No myalgias. LDL 102; TG 218; Tchol 176; HDL 36  Insomnia - she takes prn restoril 15mg    OSA - on CPAP with 2L  O2 qHS. Last sleep study in June 2018  DM with neuropathy - BS stable.  A1c 8.4%. No low BS reactions. She takes toujeo, apidra. Gabapentin controls neuropathy. She has stage 3 kidney disease. She has allergy to ACEI but can tolerate ARB. She is tolerating micardis 20mg  daily for renal protection  Anemia - has rec'd multiple units PRBCs in past. Last Hgb 7.7. She has a hx iron deficiency and is taking iron supplement.  CKD - stage 3. Cr  1.2. Followed by nephrology  Gout - she has frequent gout flares in her toe. She eats polish sausage a few times per week. She takes allopurinol. uoloric stopped 2/2 c/a affecting heart.  Hypothyroidism - no sx's. She takes levothyroxine. TSH 3.3  GERD - stable off protonix    Past Medical History:  Diagnosis Date  . AICD (automatic cardioverter/defibrillator) present    high RV threshold chronically, device was turned off in 2014; Device battery has been  dead x 7 years  . Anemia, iron deficiency   . Angioedema    felt to likely be due to ace inhibitors but says she has had this even off of medicines, appears to be tolerating ARBs chronically  . Anxiety   . Arthritis    "hands, legs, arms" (07/13/2017)  . Asthmatic bronchitis with status asthmaticus   . Bradycardia   . CHF (congestive heart failure) ([place])   . Chronic lower back pain   . Chronic pain   . Chronic renal insufficiency   . CKD (chronic kidney disease), stage III ([place])   . Coronary artery  disease    Mild nonobstructive (30% LAD, 30% RCA) by 09/01/16 cath at [facility]  . Degenerative joint disease   . Depression   . Diabetes mellitus, type 2 ([place])   . Diabetic peripheral neuropathy ([place])   . Dizziness   . Dyspnea on exertion   . Family history of adverse reaction to anesthesia    Mother has nausea  . GERD (gastroesophageal reflux disease)   . Gout   . Heart valve disorder   . History of blood transfusion 1981; ~ 2005; 12/2016   "childbirth; defibrillator OR; colostomy OR"  . History of mononucleosis 03/2014  . Hyperlipidemia   . Hypertension   . Hypothyroid   . Insomnia   . Intervertebral disc degeneration   . Ischemic cardiomyopathy   . LBBB (left bundle branch block)   . Myocardial infarction ([place]) dx'd ~ 2005  . Nonischemic cardiomyopathy ([place])    s/p ICD in 2005. EF has since recovered  . Obesity   . On home oxygen therapy    "2L at night" (07/13/2017)  . OSA on CPAP   . Pneumonia    "couple times; last time was 12/2016" (07/13/2017)  . PONV (postoperative nausea and vomiting)   . Swelling   . Syncope   . Systemic hypertension   . Vitamin D deficiency     Past Surgical History:  Procedure Laterality Date  . APPENDECTOMY  07/13/2017  . BLADDER SUSPENSION  1990   "w/hysterectomy"  . CARDIAC CATHETERIZATION  2005   no obstructive CAD per patient  . CARDIAC CATHETERIZATION  2018  . CARDIAC DEFIBRILLATOR PLACEMENT  2005   BiV ICD implanted,  LV lead is an epicardial lead  . CARPAL TUNNEL RELEASE Left 07/25/2014   Dr.Williamson   . CATARACT EXTRACTION W/ INTRAOCULAR LENS  IMPLANT, BILATERAL Bilateral   . COLONIC STENT PLACEMENT N/A 08/31/2017   Procedure: COLONIC STENT PLACEMENT;  Surgeon: Carol Ada, MD;  Location: [place];  Service: Endoscopy;  Laterality: N/A;  . COLONOSCOPY     2010-2011 Dr.Kipreos   . COLOSTOMY  12/2016   Archie Endo 01/26/2017  . COLOSTOMY REVERSAL  07/13/2017  . COLOSTOMY REVERSAL N/A 07/13/2017   Procedure:  COLOSTOMY REVERSAL;  Surgeon: Judeth Horn, MD;  Location: [place];  Service: General;  Laterality: N/A;  . CYST REMOVAL HAND Right 05/2013   thumb  . DILATION AND CURETTAGE OF UTERUS  X 5-6  . FLEXIBLE SIGMOIDOSCOPY N/A 08/24/2017   Procedure: FLEXIBLE SIGMOIDOSCOPY;  Surgeon: Carol Ada, MD;  Location: [place];  Service: Endoscopy;  Laterality: N/A;  . FLEXIBLE SIGMOIDOSCOPY N/A 08/31/2017   Procedure: FLEXIBLE SIGMOIDOSCOPY;  Surgeon: Carol Ada, MD;  Location: [place];  Service: Endoscopy;  Laterality: N/A;  stent placement  . IMPLANTABLE CARDIOVERTER DEFIBRILLATOR GENERATOR CHANGE  2008  . LAPAROTOMY N/A 09/18/2017   Procedure: EXPLORATORY LAPAROTOMY;  Surgeon: Judeth Horn, MD;  Location: [place];  Service: General;  Laterality: N/A;  . LYSIS OF ADHESION N/A 09/18/2017   Procedure: LYSIS OF ADHESION;  Surgeon: Judeth Horn, MD;  Location: [place];  Service: General;  Laterality: N/A;  . PARTIAL COLECTOMY N/A 09/18/2017   Procedure: ILEOCOLECTOMY;  Surgeon: Judeth Horn, MD;  Location: [place];  Service: General;  Laterality: N/A;  . PARTIAL COLECTOMY  09/25/2017  . SIGMOIDOSCOPY N/A 09/18/2017   Procedure: SIGMOIDOSCOPY;  Surgeon: Judeth Horn, MD;  Location: [place];  Service: General;  Laterality: N/A;  . TOTAL ABDOMINAL HYSTERECTOMY  1990  . TRIGGER FINGER RELEASE Right 05/213  . TUBAL LIGATION  1980s  . VESICOVAGINAL FISTULA CLOSURE W/ TAH       reports that she has never smoked. She has never used smokeless tobacco. She reports that she does not drink alcohol or use drugs. Social History   Socioeconomic History  . Marital status: Widowed    Spouse name: Not on file  . Number of children: Not on file  . Years of education: Not on file  . Highest education level: Not on file  Occupational History    Comment: retired Corporate treasurer  Social Needs  . Financial resource strain: Not hard at all  . Food insecurity:    Worry: Never true    Inability: Never true  . Transportation needs:     Medical: No    Non-medical: No  Tobacco Use  . Smoking status: Never Smoker  . Smokeless tobacco: Never Used  Substance and Sexual Activity  . Alcohol use: No  . Drug use: No  . Sexual activity: Never  Lifestyle  . Physical activity:    Days per week: 0 days    Minutes per session: 0 min  . Stress: To some extent  Relationships  . Social connections:    Talks on phone: More than three times a week    Gets together: More than three times a week    Attends religious service: More than 4 times per year    Active member of club or organization: No    Attends meetings of clubs or organizations: Never    Relationship status: Widowed  . Intimate partner violence:    Fear of current or ex partner: No    Emotionally abused: No    Physically abused: No    Forced sexual activity: No  Other Topics Concern  . Not on file  Social History Narrative   Pt lives in [place] alone.  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      As of 03/15/17:   Diet: 1800 Calorie      Caffeine: Yes      Married, if yes what year: Widowed, married 1973      Do you live in a house, apartment, assisted living, condo, trailer, ect: House, 1 stories, and 1 person      Pets: No      Current/Past profession: LPN, retired       Exercise: Yes, walking          Living Will: Yes   DNR: No   POA/HPOA: Yes      Functional Status:   Do you have difficulty bathing or dressing yourself? No   Do you have difficulty preparing food or eating? No   Do you have difficulty managing your medications? No   Do you have difficulty managing your finances? No   Do you have difficulty  affording your medications? Yes    Family History  Problem Relation Age of Onset  . Hypertension Father   . Heart disease Father   . Cancer Father        prostate  . Heart disease Other        (Maternal side) Ischemic heart disease  . Diabetes Mellitus II Other   . Arrhythmia  Mother   . Diabetes Mellitus II Mother        Borderline DM  . Hypertension Mother   . Asthma Mother   . Cancer Brother   . Dementia Brother   . Hypertension Brother   . Hypertension Daughter   . Hypertension Daughter   . Diabetes Daughter   . Hypertension Daughter   . Atrial fibrillation Daughter   . GER disease Daughter   . Hypertension Son   . Anxiety disorder Son   . Hypothyroidism Son     Allergies  Allergen Reactions  . Glucophage [Metformin Hcl] Other (See Comments)    Renal failure  . Ace Inhibitors Swelling and Other (See Comments)    Angioedema  . Advicor [Niacin-Lovastatin Er] Other (See Comments)    Muscle aches  . Bystolic [Nebivolol Hcl] Other (See Comments)    Edema  . Erythromycin Diarrhea, Nausea And Vomiting and Other (See Comments)    *DERIVATIVES*  . Lipitor [Atorvastatin] Other (See Comments)    Muscle aches  . Lopid [Gemfibrozil] Other (See Comments)    Muscle aches  . Statins Other (See Comments)    Muscle aches  . Telmisartan     AVOID ARB/ ACEi in this patient due to recurrent AKI  . Adhesive [Tape] Rash    Outpatient Encounter Medications as of 11/22/2017  Medication Sig  . acetaminophen (TYLENOL) 500 MG tablet Take 1,000 mg by mouth every 6 (six) hours as needed for moderate pain or headache.  . allopurinol (ZYLOPRIM) 100 MG tablet Take 100 mg by mouth daily.  . Calcium Carbonate-Vitamin D (CALTRATE 600+D) 600-400 MG-UNIT tablet Take 1 tablet by mouth 2 (two) times daily.  . carvedilol (COREG) 12.5 MG tablet Take 12.5 mg by mouth 2 (two) times daily with a meal.  . CRESTOR 10 MG tablet Take 1 tablet (10 mg total) by mouth at bedtime.  . DULoxetine (CYMBALTA) 30 MG capsule Take 1 capsule (30 mg total) by mouth daily.  . ferrous gluconate (FERGON) 324 MG tablet Take 1 tablet (324 mg total) by mouth 3 (three) times daily with meals.  . gabapentin (NEURONTIN) 100 MG capsule Take 100 mg by mouth each morning.  . gabapentin (NEURONTIN) 800 MG  tablet Take 800 mg by mouth daily at bedtime.  . Insulin Glargine (TOUJEO SOLOSTAR) 300 UNIT/ML SOPN Inject 50 Units into the skin 2 (two) times daily.  . Insulin Glulisine (APIDRA) 100 UNIT/ML Solostar Pen Inject 20 Units into the skin 3 (three) times daily.  Marland Kitchen levothyroxine (SYNTHROID, LEVOTHROID) 88 MCG tablet Take 88 mcg by mouth daily.   Marland Kitchen loratadine (CLARITIN) 10 MG tablet Take 10 mg by mouth daily.  . Misc. Devices MISC by Does not apply route. C-pap  . montelukast (SINGULAIR) 10 MG tablet Take 10 mg by mouth at bedtime.  . multivitamin (PROSIGHT) TABS tablet Take 1 tablet by mouth daily.  Marland Kitchen omega-3 acid ethyl esters (LOVAZA) 1 g capsule Take 4 g by mouth at bedtime.   . OXYGEN Inhale 2 L into the lungs.  . temazepam (RESTORIL) 15 MG capsule Take 1 capsule (15 mg total) by  mouth at bedtime as needed for sleep.  . [DISCONTINUED] oxyCODONE (OXY IR/ROXICODONE) 5 MG immediate release tablet Take 1-2 tablets (5-10 mg total) by mouth every 4 (four) hours as needed for moderate pain or severe pain.  . [DISCONTINUED] ULORIC 40 MG tablet Take 40 mg by mouth daily.    No facility-administered encounter medications on file as of 11/22/2017.     Review of Systems:  Review of Systems  Gastrointestinal: Positive for abdominal distention.       Loose stools  All other systems reviewed and are negative.   Health Maintenance  Topic Date Due  . Hepatitis C Screening  Jul 16, 1951  . URINE MICROALBUMIN  12/01/1959  . COLONOSCOPY  12/01/1999  . MAMMOGRAM  10/18/2017  . OPHTHALMOLOGY EXAM  06/24/2018 (Originally 12/01/1959)  . TETANUS/TDAP  11/23/2018 (Originally 09/20/2011)  . HEMOGLOBIN A1C  01/03/2018  . INFLUENZA VACCINE  02/22/2018  . FOOT EXAM  04/26/2018  . DEXA SCAN  Completed  . PNA vac Low Risk Adult  Completed    Physical Exam: Vitals:   11/22/17 1413  BP: 118/68  Pulse: 86  Temp: 98.4 F (36.9 C)  TempSrc: Oral  SpO2: 95%  Weight: 203 lb (92.1 kg)  Height: 5\' 6"  (1.676 m)    Body mass index is 32.77 kg/m. Physical Exam  Constitutional: She is oriented to person, place, and time. She appears well-developed and well-nourished.  HENT:  Mouth/Throat: Oropharynx is clear and moist. No oropharyngeal exudate.  Eyes: Pupils are equal, round, and reactive to light. No scleral icterus.  Neck: Neck supple. Carotid bruit is not present. No tracheal deviation present. No thyromegaly present.  Cardiovascular: Normal rate, regular rhythm, normal heart sounds and intact distal pulses. Exam reveals no gallop and no friction rub.  No murmur heard. No LE edema b/l. no calf TTP.   Pulmonary/Chest: Effort normal and breath sounds normal. No stridor. No respiratory distress. She has no wheezes. She has no rales.  Abdominal: Soft. Bowel sounds are normal. She exhibits no distension and no mass. There is no hepatomegaly. There is tenderness (lowerquadrant at incisional scar). There is no rebound and no guarding.    Obese; min keloid at incisional scars  Musculoskeletal: She exhibits edema.  Lymphadenopathy:    She has no cervical adenopathy.  Neurological: She is alert and oriented to person, place, and time. She has normal reflexes.  Skin: Skin is warm and dry. No rash noted.  Psychiatric: She has a normal mood and affect. Her behavior is normal. Judgment and thought content normal.    Labs reviewed: Basic Metabolic Panel: Recent Labs    03/15/17 1226  08/29/17 0546  08/31/17 0355 09/01/17 0304 09/02/17 0409  09/23/17 0432 09/24/17 0448 09/25/17 0541 10/03/17  NA 136   < > 138   < > 134* 135 135   < > 137 134* 136 138  K 4.3   < > 3.8   < > 4.4 4.3 4.7   < > 3.8 3.6 3.3* 4.8  CL 99   < > 103   < > 101 103 103   < > 107 103 107  --   CO2 25   < > 24   < > 22 22 23    < > 21* 21* 22  --   GLUCOSE 126*   < > 136*   < > 153* 163* 202*   < > 95 139* 145*  --   BUN 19   < > 7   < >  22* 26* 25*   < > 8 <5* <5* 10  CREATININE 1.32*   < > 1.29*   < > 1.30* 1.31* 1.23*   < >  1.04* 0.98 0.97 1.2*  CALCIUM 9.4   < > 9.0   < > 9.1 9.2 9.1   < > 7.8* 7.8* 7.8*  --   MG  --    < > 1.5*   < > 1.7 2.0 2.2  --   --   --   --   --   PHOS  --    < > 3.7  --  4.1  --  4.4  --   --   --   --   --   TSH 3.30  --   --   --   --   --   --   --   --   --   --   --    < > = values in this interval not displayed.   Liver Function Tests: Recent Labs    08/29/17 0546 08/31/17 0355 09/15/17 1629 10/03/17  AST 12* 12* 19 10*  ALT 8* 8* 16 11  ALKPHOS 67 71 104 244*  BILITOT 0.8 0.6 1.1  --   PROT 6.3* 6.5 8.5*  --   ALBUMIN 3.0* 3.1* 4.2  --    Recent Labs    01/26/17 1400 08/22/17 1053 09/15/17 1629  LIPASE 37 37 35  AMYLASE 20*  --   --    No results for input(s): AMMONIA in the last 8760 hours. CBC: Recent Labs    09/22/17 0402 09/23/17 0432 09/24/17 0448 09/25/17 0541 10/03/17  WBC 9.1 7.9 8.6 8.9 10.0  NEUTROABS 6.3 5.2  --   --  7,910  HGB 8.1* 7.8* 8.9* 8.4* 7.7*  HCT 25.1* 23.9* 27.1* 25.7* 25*  MCV 82.0 81.8 80.4 80.6  --   PLT 238 271 310 328 440*   Lipid Panel: Recent Labs    01/27/17 0505 04/26/17 1639 08/29/17 0552  CHOL  --  176  --   HDL  --  36*  --   LDLCALC  --  102*  --   TRIG 515* 263* 218*  CHOLHDL  --  4.9  --    Lab Results  Component Value Date   HGBA1C 8.4 (H) 07/05/2017    Procedures since last visit: Dexascan  Result Date: 11/13/2017 EXAM: DUAL X-RAY ABSORPTIOMETRY (DXA) FOR BONE MINERAL DENSITY IMPRESSION: Referring Physician:  Titus Dubin PATIENT: Name: Jendayi, Berling Patient ID:  [id number] Birth Date: 14-Apr-1951 Height:     66.0 in. Sex:         Female Measured:   11/13/2017 Weight:     183.0 lbs. Indications: Caucasian, Estrogen Deficient, Gabapentin, Insulin for Diabetes Fractures: None Treatments: Multivitamin ASSESSMENT: The BMD measured at Femur Neck Left is 0.891 g/cm2 with a T-score of -1.1. This patient is considered OSTEOPENIC according to [facility]) criteria. Site Region Measured  Date Measured Age YA T-score BMD Significant CHANGE DualFemur Neck Left 11/13/2017    [age]         -1.1    0.891 g/cm2 AP Spine  L1-L4     [age]/2017    [age]         1.1     1.189 g/cm2 World Health Organization [facility]) criteria for post-menopausal, Caucasian Women: Normal       T-score at or above -1 SD Osteopenia   T-score between -  1 and -2.5 SD Osteoporosis T-score at or below -2.5 SD RECOMMENDATION: [place] recommends that FDA-approved medical therapies be considered in postmenopausal women and men age 56 or older with a: 1. Hip or vertebral (clinical or morphometric) fracture. 2. T-score of less than or equal to -2.5 at the spine or hip. 3. Ten-year fracture probability by FRAX of 3% or greater for hip fracture or 20% or greater for major osteoporotic fracture. All treatment decisions require clinical judgment and consideration of individual patient factors, including patient preferences, co-morbidities, previous drug use, risk factors not captured in the FRAX model (e.g. falls, vitamin D deficiency, increased bone turnover, interval significant decline in bone density) and possible under- or over-estimation of fracture risk by FRAX. All patients should ensure an adequate intake of dietary calcium (1200 mg/d) and vitamin D (800 IU daily) unless contraindicated. FOLLOW-UP: People with diagnosed cases of osteoporosis or at high risk for fracture should have regular bone mineral density tests. For patients eligible for Medicare, routine testing is allowed once every 2 years. The testing frequency can be increased to one year for patients who have rapidly progressing disease, those who are receiving or discontinuing medical therapy to restore bone mass, or have additional risk factors. I have reviewed this report and agree with the above findings. Mark A. Thornton Papas, M.D. [facility] FRAX* 10-year Probability of Fracture Based on femoral neck BMD: DualFemur (Left) Major Osteoporotic  Fracture: 3.8% Hip Fracture:                0.3% Population:                  [place] ([place]) Risk Factors:                None *FRAX is a Materials engineer of the [employer] for Metabolic Bone Disease, a World Pharmacologist (WHO) [facility]. ASSESSMENT: The probability of a major osteoporotic fracture is 3.8 % within the next ten years. The probability of hip fracture is  0.3  % within the next 10 years. I have reviewed this report and agree with the above findings. Mark A. Thornton Papas, M.D. [facility] Electronically Signed   By: Lavonia Dana M.D.   On: 11/13/2017 13:42    Assessment/Plan   ICD-10-CM   1. Type 2 diabetes mellitus with stage 3 chronic kidney disease, with long-term current use of insulin (HCC) E11.22 Hemoglobin A1c   N18.3 Microalbumin/Creatinine Ratio, Urine   Z79.4   2. Morbid (severe) obesity due to excess calories (HCC)Chronic E66.01   3. Screening for breast cancer Z12.31 MM DIGITAL SCREENING BILATERAL  4. Encounter for hepatitis C virus screening test for high risk patient Z11.59 Hep C Antibody   Z91.89   5. History of blood transfusion Z92.89 Hep C Antibody  6. Anemia, unspecified type D64.9 CBC with Differential/Platelets  7. S/P partial colectomy Z90.49   8. Essential hypertension I10   9. Chronic gout of multiple sites, unspecified cause [id number].[id number] allopurinol (ZYLOPRIM) 100 MG tablet  10. Hypothyroidism due to acquired atrophy of thyroid E03.4 TSH    T4, Free  11. Hyperlipidemia associated with type 2 diabetes mellitus (HCC) E11.69 Lipid Panel   E78.5     Will call with lab results  Follow up with eye provider for annual eye exam  Continue current medications as ordered  Follow up in 3 mos for DM, HTN, cholesterol and thyroid  Maryella Abood S. Eulas Post, D. O., F. [place]  [facility] [address] [place], [place] [address] [phone] Cell (Monday-Friday 8 AM - 5 PM) [phone]  After 5 PM and follow prompts

## 2017-11-23 LAB — HEPATITIS C ANTIBODY
Hepatitis C Ab: NONREACTIVE
SIGNAL TO CUT-OFF: 0.04 (ref <1.00)

## 2017-11-23 LAB — HEMOGLOBIN A1C
Hgb A1c MFr Bld: 6.9 % of total Hgb — ABNORMAL HIGH (ref <5.7)
Mean Plasma Glucose: 151 (calc)
eAG (mmol/L): 8.4 (calc)

## 2017-11-23 LAB — LIPID PANEL
CHOL/HDL RATIO: 3.9 (calc) (ref <5.0)
CHOLESTEROL: 186 mg/dL (ref <200)
HDL: 48 mg/dL — ABNORMAL LOW (ref >50)
LDL CHOLESTEROL (CALC): 98 mg/dL
Non-HDL Cholesterol (Calc): 138 mg/dL (calc) — ABNORMAL HIGH (ref <130)
TRIGLYCERIDES: 279 mg/dL — AB (ref <150)

## 2017-11-23 LAB — MICROALBUMIN / CREATININE URINE RATIO
CREATININE, URINE: 16 mg/dL — AB (ref 20–275)
MICROALB/CREAT RATIO: 244 ug/mg{creat} — AB (ref <30)
Microalb, Ur: 3.9 mg/dL

## 2017-11-27 ENCOUNTER — Telehealth: Payer: Self-pay

## 2017-11-27 MED ORDER — LOSARTAN POTASSIUM 25 MG PO TABS: 25.0000 mg | ORAL_TABLET | Freq: Every day | ORAL | 6 refills | Status: DC

## 2017-11-27 NOTE — Telephone Encounter (Signed)
-----   Message from [place], [place] sent at 11/26/2017  9:51 PM EDT ----- Rx losartan 25mg  #30 take 1 tab po daily for kidney protection with 6 RF

## 2017-11-27 NOTE — Telephone Encounter (Signed)
RX sent

## 2017-12-04 ENCOUNTER — Other Ambulatory Visit: Payer: Self-pay | Admitting: Nurse Practitioner

## 2017-12-21 ENCOUNTER — Ambulatory Visit
Admission: RE | Admit: 2017-12-21 | Discharge: 2017-12-21 | Disposition: A | Discharge status: Home / Self Care | Payer: Medicare Other | Source: Ambulatory Visit | Attending: Internal Medicine | Admitting: Internal Medicine

## 2017-12-21 ENCOUNTER — Other Ambulatory Visit: Payer: Self-pay | Admitting: Internal Medicine

## 2017-12-21 DIAGNOSIS — Z1239 Encounter for other screening for malignant neoplasm of breast: Secondary | ICD-10-CM

## 2017-12-21 DIAGNOSIS — Z1231 Encounter for screening mammogram for malignant neoplasm of breast: Secondary | ICD-10-CM | POA: Diagnosis not present

## 2018-01-05 ENCOUNTER — Other Ambulatory Visit: Payer: Self-pay | Admitting: Internal Medicine

## 2018-01-07 MED ORDER — TEMAZEPAM 15 MG PO CAPS: 15.0000 mg | ORAL_CAPSULE | Freq: Every evening | ORAL | 0 refills | Status: DC | PRN

## 2018-01-08 ENCOUNTER — Other Ambulatory Visit: Payer: Self-pay | Admitting: Nurse Practitioner

## 2018-01-08 DIAGNOSIS — F32A Depression, unspecified: Secondary | ICD-10-CM

## 2018-01-08 DIAGNOSIS — F329 Major depressive disorder, single episode, unspecified: Secondary | ICD-10-CM

## 2018-01-14 ENCOUNTER — Encounter: Payer: Self-pay | Admitting: Internal Medicine

## 2018-01-15 MED ORDER — GABAPENTIN 100 MG PO CAPS: ORAL_CAPSULE | ORAL | 1 refills | Status: DC

## 2018-02-01 DIAGNOSIS — M1811 Unilateral primary osteoarthritis of first carpometacarpal joint, right hand: Secondary | ICD-10-CM | POA: Diagnosis not present

## 2018-02-01 DIAGNOSIS — M1812 Unilateral primary osteoarthritis of first carpometacarpal joint, left hand: Secondary | ICD-10-CM | POA: Diagnosis not present

## 2018-02-01 DIAGNOSIS — M65321 Trigger finger, right index finger: Secondary | ICD-10-CM | POA: Diagnosis not present

## 2018-02-01 DIAGNOSIS — G5601 Carpal tunnel syndrome, right upper limb: Secondary | ICD-10-CM | POA: Diagnosis not present

## 2018-02-27 ENCOUNTER — Ambulatory Visit: Payer: Medicare Other | Admitting: Internal Medicine

## 2018-03-05 DIAGNOSIS — Z09 Encounter for follow-up examination after completed treatment for conditions other than malignant neoplasm: Secondary | ICD-10-CM | POA: Diagnosis not present

## 2018-03-07 DIAGNOSIS — G5601 Carpal tunnel syndrome, right upper limb: Secondary | ICD-10-CM | POA: Diagnosis not present

## 2018-03-07 DIAGNOSIS — M1811 Unilateral primary osteoarthritis of first carpometacarpal joint, right hand: Secondary | ICD-10-CM | POA: Diagnosis not present

## 2018-03-07 DIAGNOSIS — M1812 Unilateral primary osteoarthritis of first carpometacarpal joint, left hand: Secondary | ICD-10-CM | POA: Diagnosis not present

## 2018-03-07 DIAGNOSIS — M65321 Trigger finger, right index finger: Secondary | ICD-10-CM | POA: Diagnosis not present

## 2018-03-14 ENCOUNTER — Encounter: Payer: Self-pay | Admitting: Internal Medicine

## 2018-04-05 DIAGNOSIS — M65321 Trigger finger, right index finger: Secondary | ICD-10-CM | POA: Diagnosis not present

## 2018-04-06 ENCOUNTER — Other Ambulatory Visit: Payer: Self-pay | Admitting: Internal Medicine

## 2018-04-06 DIAGNOSIS — F32A Depression, unspecified: Secondary | ICD-10-CM

## 2018-04-06 DIAGNOSIS — F329 Major depressive disorder, single episode, unspecified: Secondary | ICD-10-CM

## 2018-04-10 ENCOUNTER — Ambulatory Visit: Payer: Medicare Other | Admitting: Internal Medicine

## 2018-04-10 ENCOUNTER — Other Ambulatory Visit: Payer: Self-pay | Admitting: General Surgery

## 2018-04-10 DIAGNOSIS — T8189XS Other complications of procedures, not elsewhere classified, sequela: Secondary | ICD-10-CM

## 2018-04-12 ENCOUNTER — Other Ambulatory Visit: Payer: Self-pay | Admitting: Internal Medicine

## 2018-04-17 ENCOUNTER — Ambulatory Visit
Admission: RE | Admit: 2018-04-17 | Discharge: 2018-04-17 | Disposition: A | Discharge status: Home / Self Care | Payer: Medicare Other | Source: Ambulatory Visit | Attending: General Surgery | Admitting: General Surgery

## 2018-04-17 DIAGNOSIS — T8189XS Other complications of procedures, not elsewhere classified, sequela: Secondary | ICD-10-CM

## 2018-04-17 DIAGNOSIS — K8020 Calculus of gallbladder without cholecystitis without obstruction: Secondary | ICD-10-CM | POA: Diagnosis not present

## 2018-04-24 ENCOUNTER — Encounter: Payer: Self-pay | Admitting: Internal Medicine

## 2018-04-25 DIAGNOSIS — I129 Hypertensive chronic kidney disease with stage 1 through stage 4 chronic kidney disease, or unspecified chronic kidney disease: Secondary | ICD-10-CM | POA: Diagnosis not present

## 2018-04-25 DIAGNOSIS — E1122 Type 2 diabetes mellitus with diabetic chronic kidney disease: Secondary | ICD-10-CM | POA: Diagnosis not present

## 2018-04-25 DIAGNOSIS — N183 Chronic kidney disease, stage 3 (moderate): Secondary | ICD-10-CM | POA: Diagnosis not present

## 2018-05-01 DIAGNOSIS — L7682 Other postprocedural complications of skin and subcutaneous tissue: Secondary | ICD-10-CM | POA: Diagnosis not present

## 2018-05-02 ENCOUNTER — Telehealth: Payer: Self-pay | Admitting: *Deleted

## 2018-05-02 NOTE — Telephone Encounter (Signed)
Received fax from [facility] [phone] regarding needing to be scheduled for exploratory surgery under general anesthesia due to nonhealing surgical wound.   Needs a Medical/Cardiac Clearance note faxed to [place], [facility]  Patient notified and Appointment scheduled for 05/04/2018 with Dr. Eulas Post for Clearance letter. Paperwork placed in [place] under 11.

## 2018-05-03 DIAGNOSIS — M65332 Trigger finger, left middle finger: Secondary | ICD-10-CM | POA: Diagnosis not present

## 2018-05-03 DIAGNOSIS — M65321 Trigger finger, right index finger: Secondary | ICD-10-CM | POA: Diagnosis not present

## 2018-05-03 DIAGNOSIS — M25562 Pain in left knee: Secondary | ICD-10-CM | POA: Diagnosis not present

## 2018-05-04 ENCOUNTER — Encounter: Payer: Self-pay | Admitting: Internal Medicine

## 2018-05-04 ENCOUNTER — Ambulatory Visit (INDEPENDENT_AMBULATORY_CARE_PROVIDER_SITE_OTHER): Payer: Medicare Other | Admitting: Internal Medicine

## 2018-05-04 VITALS — BP 130/72 | HR 76 | Temp 98.2°F | Ht 66.0 in | Wt 217.0 lb

## 2018-05-04 DIAGNOSIS — Z9049 Acquired absence of other specified parts of digestive tract: Secondary | ICD-10-CM

## 2018-05-04 DIAGNOSIS — Z01818 Encounter for other preprocedural examination: Secondary | ICD-10-CM

## 2018-05-04 DIAGNOSIS — N183 Chronic kidney disease, stage 3 unspecified: Secondary | ICD-10-CM

## 2018-05-04 DIAGNOSIS — E785 Hyperlipidemia, unspecified: Secondary | ICD-10-CM

## 2018-05-04 DIAGNOSIS — I5022 Chronic systolic (congestive) heart failure: Secondary | ICD-10-CM

## 2018-05-04 DIAGNOSIS — Z794 Long term (current) use of insulin: Secondary | ICD-10-CM

## 2018-05-04 DIAGNOSIS — E034 Atrophy of thyroid (acquired): Secondary | ICD-10-CM

## 2018-05-04 DIAGNOSIS — Z23 Encounter for immunization: Secondary | ICD-10-CM | POA: Diagnosis not present

## 2018-05-04 DIAGNOSIS — E1169 Type 2 diabetes mellitus with other specified complication: Secondary | ICD-10-CM

## 2018-05-04 DIAGNOSIS — T8189XA Other complications of procedures, not elsewhere classified, initial encounter: Secondary | ICD-10-CM | POA: Diagnosis not present

## 2018-05-04 DIAGNOSIS — R0602 Shortness of breath: Secondary | ICD-10-CM | POA: Diagnosis not present

## 2018-05-04 DIAGNOSIS — E1122 Type 2 diabetes mellitus with diabetic chronic kidney disease: Secondary | ICD-10-CM

## 2018-05-04 NOTE — Patient Instructions (Signed)
Await xray results and labs prior to surgical clearance  Continue current medications as ordered  Continue wound care as directed per surgery  Will call with cardiology referral  Follow up with specialists as scheduled - please get last office note/labs from nephrology Crystal [name] kidney)  Recommend Wilfred Lacy, NP at [facility]  Follow up in 3 mos for routine visit

## 2018-05-04 NOTE — Progress Notes (Signed)
Patient ID: Crystal [name], female   DOB: [birth year], 67 y.o.   MRN: [id number]   Location:  [facility] OFFICE  Provider: DR Arletha Grippe  Code Status:  Goals of Care:  Advanced Directives 10/11/2017  Does Patient Have a Medical Advance Directive? Yes  Type of Advance Directive [place]  Does patient want to make changes to medical advance directive? No - Patient declined  Copy of [place] in Chart? Yes  Would patient like information on creating a medical advance directive? -     Chief Complaint  Patient presents with  . Medical Clearance    surgical clearance    HPI: Patient is a 67 y.o. female seen today for surgical clearance. She plans to have exploratory surgery under GA by Dr Hulen Skains [facility]) of nonhealing surgical wound due to c/a close proximity of small bowel to area. She requires medical clearance prior to date being scheduled for surgery. Prior to need for this surgery, her incision from previous exploratory lap --> partial colectomy reopened at the end of May/beginning of June 2019 and wound has never healed. She is followed by Dr Hulen Skains. The wound bleeds with associated blood and pus. No f/c, abdominal pain, N/V. In the last year, she has underwent lexiscan stress test, cardiac cath and 2D echo. She has 4-5 BMs per day.  She has seen Ortho Dr Grandville Silos for various complaints and has had several steroid injections for right index trigger finger; OA of hands; right CTS.   Excerpt from Nov 22, 2017 visit: "She is s/p reversal of colostomy 07/13/17 by Dr Hulen Skains followed complication of AKI and dehydration that req'd ED and subsequent hospitalization 08/10/17 -08/13/17. She presented to our office with N/V and gastroenteritis shortly after hospital d/c. She was readmitted 08/22/17-09/04/17 with intractable N/V, dehydration, hypotension, hyperkalemia. Intestinal obstruction found and stent placed on 08/31/17 with flex sig procedure. She felt better  but then N/V/abdominal pain returned. She was readmitted with colonic obstruction, dehydration and AKI. She was taken to the OR  On 09/18/17 for exploratory laparotomy and was dx with colonic stricture with obstruction and partial colectomy performed. She did well and has had no further issues."  HTN - BP stable on coreg alone. Off micardis. She was on valsartan prior to hospital stay but med stopped 2/2 hypotension.  CHF - she has had an AICD in the past but EF improved from <30% --> EF 45-50% with mild-mod LVH and grade 1 DD in 04-May-2016. Battery in AICD died and she was told it was not necessary to replace it due to improved EF. She underwent lexiscan stress test, cardiac cath and 2D echo this yr. She takes coreg; no longer takes bumex  Hyperlipidemia - stable on crestor. No myalgias. LDL 98; TG 279; Tchol 186; HDL 48  Insomnia - she takes prn restoril 15mg    OSA - on CPAP with 2L [place] O2 qHS. Last sleep study in June 2018  DM with neuropathy - BS have been elevated 2/2 frequent steroid injections in joints. CBG 386 this AM  A1c 6.9% (prev 8.4%). No low BS reactions. She takes toujeo, apidra. Gabapentin controls neuropathy. She has stage 3 kidney disease. She has allergy to ACEI but can tolerate ARB. She is tolerating micardis 20mg  daily for renal protection; urine microalbumin/Cr ratio 244  Anemia - has rec'd multiple units PRBCs in past. Hgb 11.6. She has a hx iron deficiency and is taking iron supplement.  CKD - stage  3. Cr  1.2. Followed by nephrology Dr Florene Glen at [facility]; Hgb 11.6  Gout - she has frequent gout flares in her toe. She eats polish sausage a few times per week. She takes allopurinol. uoloric stopped 2/2 c/a affecting heart.  Hypothyroidism - no sx's. She takes levothyroxine. TSH 0.41; T4 free 1.4  GERD - stable off protonix   Past Medical History:  Diagnosis Date  . AICD (automatic cardioverter/defibrillator) present    high RV threshold chronically, device was  turned off in 2014; Device battery has been dead x 7 years  . Anemia, iron deficiency   . Angioedema    felt to likely be due to ace inhibitors but says she has had this even off of medicines, appears to be tolerating ARBs chronically  . Anxiety   . Arthritis    "hands, legs, arms" (07/13/2017)  . Asthmatic bronchitis with status asthmaticus   . Bradycardia   . CHF (congestive heart failure) ([place])   . Chronic lower back pain   . Chronic pain   . Chronic renal insufficiency   . CKD (chronic kidney disease), stage III ([place])   . Coronary artery disease    Mild nonobstructive (30% LAD, 30% RCA) by 09/01/16 cath at [facility]  . Degenerative joint disease   . Depression   . Diabetes mellitus, type 2 ([place])   . Diabetic peripheral neuropathy ([place])   . Dizziness   . Dyspnea on exertion   . Family history of adverse reaction to anesthesia    Mother has nausea  . GERD (gastroesophageal reflux disease)   . Gout   . Heart valve disorder   . History of blood transfusion 1981; ~ 2005; 12/2016   "childbirth; defibrillator OR; colostomy OR"  . History of mononucleosis 03/2014  . Hyperlipidemia   . Hypertension   . Hypothyroid   . Insomnia   . Intervertebral disc degeneration   . Ischemic cardiomyopathy   . LBBB (left bundle branch block)   . Myocardial infarction ([place]) dx'd ~ 2005  . Nonischemic cardiomyopathy ([place])    s/p ICD in 2005. EF has since recovered  . Obesity   . On home oxygen therapy    "2L at night" (07/13/2017)  . OSA on CPAP   . Pneumonia    "couple times; last time was 12/2016" (07/13/2017)  . PONV (postoperative nausea and vomiting)   . Swelling   . Syncope   . Systemic hypertension   . Vitamin D deficiency     Past Surgical History:  Procedure Laterality Date  . APPENDECTOMY  07/13/2017  . BLADDER SUSPENSION  1990   "w/hysterectomy"  . CARDIAC CATHETERIZATION  2005   no obstructive CAD per patient  . CARDIAC CATHETERIZATION  2018  . CARDIAC  DEFIBRILLATOR PLACEMENT  2005   BiV ICD implanted,  LV lead is an epicardial lead  . CARPAL TUNNEL RELEASE Left 07/25/2014   Dr.Williamson   . CATARACT EXTRACTION W/ INTRAOCULAR LENS  IMPLANT, BILATERAL Bilateral   . COLONIC STENT PLACEMENT N/A 08/31/2017   Procedure: COLONIC STENT PLACEMENT;  Surgeon: Carol Ada, MD;  Location: [place];  Service: Endoscopy;  Laterality: N/A;  . COLONOSCOPY     2010-2011 Dr.Kipreos   . COLOSTOMY  12/2016   Archie Endo 01/26/2017  . COLOSTOMY REVERSAL  07/13/2017  . COLOSTOMY REVERSAL N/A 07/13/2017   Procedure: COLOSTOMY REVERSAL;  Surgeon: Judeth Horn, MD;  Location: [place];  Service: General;  Laterality: N/A;  . CYST REMOVAL HAND Right 05/2013  thumb  . DILATION AND CURETTAGE OF UTERUS  X 5-6  . FLEXIBLE SIGMOIDOSCOPY N/A 08/24/2017   Procedure: FLEXIBLE SIGMOIDOSCOPY;  Surgeon: Carol Ada, MD;  Location: [place];  Service: Endoscopy;  Laterality: N/A;  . FLEXIBLE SIGMOIDOSCOPY N/A 08/31/2017   Procedure: FLEXIBLE SIGMOIDOSCOPY;  Surgeon: Carol Ada, MD;  Location: [place];  Service: Endoscopy;  Laterality: N/A;  stent placement  . IMPLANTABLE CARDIOVERTER DEFIBRILLATOR GENERATOR CHANGE  2008  . LAPAROTOMY N/A 09/18/2017   Procedure: EXPLORATORY LAPAROTOMY;  Surgeon: Judeth Horn, MD;  Location: [place];  Service: General;  Laterality: N/A;  . LYSIS OF ADHESION N/A 09/18/2017   Procedure: LYSIS OF ADHESION;  Surgeon: Judeth Horn, MD;  Location: [place];  Service: General;  Laterality: N/A;  . PARTIAL COLECTOMY N/A 09/18/2017   Procedure: ILEOCOLECTOMY;  Surgeon: Judeth Horn, MD;  Location: [place];  Service: General;  Laterality: N/A;  . PARTIAL COLECTOMY  09/25/2017  . SIGMOIDOSCOPY N/A 09/18/2017   Procedure: SIGMOIDOSCOPY;  Surgeon: Judeth Horn, MD;  Location: [place];  Service: General;  Laterality: N/A;  . TOTAL ABDOMINAL HYSTERECTOMY  1990  . TRIGGER FINGER RELEASE Right 05/213  . TUBAL LIGATION  1980s  . VESICOVAGINAL FISTULA CLOSURE  W/ TAH       reports that she has never smoked. She has never used smokeless tobacco. She reports that she does not drink alcohol or use drugs. Social History   Socioeconomic History  . Marital status: Widowed    Spouse name: Not on file  . Number of children: Not on file  . Years of education: Not on file  . Highest education level: Not on file  Occupational History    Comment: retired Corporate treasurer  Social Needs  . Financial resource strain: Not hard at all  . Food insecurity:    Worry: Never true    Inability: Never true  . Transportation needs:    Medical: No    Non-medical: No  Tobacco Use  . Smoking status: Never Smoker  . Smokeless tobacco: Never Used  Substance and Sexual Activity  . Alcohol use: No  . Drug use: No  . Sexual activity: Never  Lifestyle  . Physical activity:    Days per week: 0 days    Minutes per session: 0 min  . Stress: To some extent  Relationships  . Social connections:    Talks on phone: More than three times a week    Gets together: More than three times a week    Attends religious service: More than 4 times per year    Active member of club or organization: No    Attends meetings of clubs or organizations: Never    Relationship status: Widowed  . Intimate partner violence:    Fear of current or ex partner: No    Emotionally abused: No    Physically abused: No    Forced sexual activity: No  Other Topics Concern  . Not on file  Social History Narrative   Pt lives in [place] alone.  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      As of 03/15/17:   Diet: 1800 Calorie      Caffeine: Yes      Married, if yes what year: Widowed, married 1973      Do you live in a house, apartment, assisted living, [place], trailer, ect: House, 1 stories, and 1 person      Pets: No  Current/Past profession: LPN, retired       Exercise: Yes, walking          Living Will: Yes   DNR: No   POA/HPOA: Yes        Functional Status:   Do you have difficulty bathing or dressing yourself? No   Do you have difficulty preparing food or eating? No   Do you have difficulty managing your medications? No   Do you have difficulty managing your finances? No   Do you have difficulty affording your medications? Yes    Family History  Problem Relation Age of Onset  . Hypertension Father   . Heart disease Father   . Cancer Father        prostate  . Heart disease Other        (Maternal side) Ischemic heart disease  . Diabetes Mellitus II Other   . Arrhythmia Mother   . Diabetes Mellitus II Mother        Borderline DM  . Hypertension Mother   . Asthma Mother   . Cancer Brother   . Dementia Brother   . Hypertension Brother   . Hypertension Daughter   . Hypertension Daughter   . Diabetes Daughter   . Hypertension Daughter   . Atrial fibrillation Daughter   . GER disease Daughter   . Hypertension Son   . Anxiety disorder Son   . Hypothyroidism Son   . Breast cancer Maternal Grandmother     Allergies  Allergen Reactions  . Glucophage [Metformin Hcl] Other (See Comments)    Renal failure  . Ace Inhibitors Swelling and Other (See Comments)    Angioedema  . Advicor [Niacin-Lovastatin Er] Other (See Comments)    Muscle aches  . Bystolic [Nebivolol Hcl] Other (See Comments)    Edema  . Erythromycin Diarrhea, Nausea And Vomiting and Other (See Comments)    *DERIVATIVES*  . Lipitor [Atorvastatin] Other (See Comments)    Muscle aches  . Lopid [Gemfibrozil] Other (See Comments)    Muscle aches  . Statins Other (See Comments)    Muscle aches  . Telmisartan     AVOID ARB/ ACEi in this patient due to recurrent AKI  . Adhesive [Tape] Rash    Outpatient Encounter Medications as of 05/04/2018  Medication Sig  . acetaminophen (TYLENOL) 500 MG tablet Take 1,000 mg by mouth every 6 (six) hours as needed for moderate pain or headache.  . allopurinol (ZYLOPRIM) 100 MG tablet Take 1 tablet (100 mg  total) by mouth daily.  . Calcium Carbonate-Vitamin D (CALTRATE 600+D) 600-400 MG-UNIT tablet Take 1 tablet by mouth 2 (two) times daily.  . carvedilol (COREG) 12.5 MG tablet Take 12.5 mg by mouth 2 (two) times daily with a meal.  . CRESTOR 10 MG tablet Take 1 tablet (10 mg total) by mouth at bedtime.  . DULoxetine (CYMBALTA) 30 MG capsule TAKE ONE CAPSULE BY MOUTH DAILY  . gabapentin (NEURONTIN) 100 MG capsule TAKE 1 CAPSULE BY MOUTH EVERY MORNING  . gabapentin (NEURONTIN) 800 MG tablet TAKE 1 TABLET BY MOUTH DAILY AT BEDTIME  . Insulin Glargine (TOUJEO MAX SOLOSTAR) 300 UNIT/ML SOPN Inject 110 Units into the skin daily.  . Insulin Glulisine (APIDRA) 100 UNIT/ML Solostar Pen Inject 20 Units into the skin 3 (three) times daily.  Marland Kitchen levothyroxine (SYNTHROID, LEVOTHROID) 88 MCG tablet Take 88 mcg by mouth daily.   Marland Kitchen loratadine (CLARITIN) 10 MG tablet Take 10 mg by mouth daily.  Marland Kitchen losartan (COZAAR) 25 MG  tablet Take 1 tablet (25 mg total) by mouth daily.  . montelukast (SINGULAIR) 10 MG tablet Take 10 mg by mouth at bedtime.  . multivitamin (PROSIGHT) TABS tablet Take 1 tablet by mouth daily.  Marland Kitchen omega-3 acid ethyl esters (LOVAZA) 1 g capsule Take 4 g by mouth at bedtime.   . OXYGEN Inhale 2 L into the lungs.  . temazepam (RESTORIL) 15 MG capsule Take 1 capsule (15 mg total) by mouth at bedtime as needed for sleep.  . [DISCONTINUED] ferrous gluconate (FERGON) 324 MG tablet Take 1 tablet (324 mg total) by mouth 3 (three) times daily with meals.  . [DISCONTINUED] Insulin Glargine (TOUJEO SOLOSTAR) 300 UNIT/ML SOPN Inject 50 Units into the skin 2 (two) times daily.  . [DISCONTINUED] Misc. Devices MISC by Does not apply route. C-pap   No facility-administered encounter medications on file as of 05/04/2018.     Review of Systems:  Review of Systems  Respiratory: Positive for shortness of breath.   Skin: Positive for wound.  All other systems reviewed and are negative.   Health Maintenance    Topic Date Due  . COLONOSCOPY  12/01/1999  . INFLUENZA VACCINE  02/22/2018  . FOOT EXAM  04/26/2018  . OPHTHALMOLOGY EXAM  06/24/2018 (Originally 12/01/1960)  . TETANUS/TDAP  11/23/2018 (Originally 09/20/2011)  . HEMOGLOBIN A1C  05/25/2018  . MAMMOGRAM  12/22/2019  . DEXA SCAN  Completed  . Hepatitis C Screening  Completed  . PNA vac Low Risk Adult  Completed    Physical Exam: Vitals:   05/04/18 0932  BP: 130/72  Pulse: 76  Temp: 98.2 F (36.8 C)  TempSrc: Oral  SpO2: 94%  Weight: 217 lb (98.4 kg)  Height: 5\' 6"  (1.676 m)   Body mass index is 35.02 kg/m. Physical Exam  Constitutional: She is oriented to person, place, and time. She appears well-developed and well-nourished.  HENT:  Mouth/Throat: Oropharynx is clear and moist. No oropharyngeal exudate.  MMM; no oral thrush  Eyes: Pupils are equal, round, and reactive to light. No scleral icterus.  Neck: Neck supple. Carotid bruit is not present. No tracheal deviation present. No thyromegaly present.  Cardiovascular: Normal rate, regular rhythm and intact distal pulses. Exam reveals no gallop and no friction rub.  Murmur (1/6 SEM) heard. No LE edema b/l. no calf TTP.   Pulmonary/Chest: Effort normal and breath sounds normal. No stridor. No respiratory distress. She has no wheezes. She has no rales. She exhibits no tenderness.  Abdominal: Soft. Normal appearance and bowel sounds are normal. She exhibits distension. She exhibits no mass. There is no hepatomegaly. There is no tenderness. There is no rigidity, no rebound and no guarding. No hernia.    obese  Lymphadenopathy:    She has no cervical adenopathy.  Neurological: She is alert and oriented to person, place, and time. She has normal reflexes.  Skin: Skin is warm and dry. No rash noted.  Open abdominal wound at incisional scar  Psychiatric: She has a normal mood and affect. Her behavior is normal. Judgment and thought content normal.    Labs reviewed: Basic  Metabolic Panel: Recent Labs    08/29/17 0546  08/31/17 0355 09/01/17 0304 09/02/17 0409  09/23/17 0432 09/24/17 0448 09/25/17 0541 10/03/17 11/22/17 1507  NA 138   < > 134* 135 135   < > 137 134* 136 138  --   K 3.8   < > 4.4 4.3 4.7   < > 3.8 3.6 3.3* 4.8  --  CL 103   < > 101 103 103   < > 107 103 107  --   --   CO2 24   < > 22 22 23    < > 21* 21* 22  --   --   GLUCOSE 136*   < > 153* 163* 202*   < > 95 139* 145*  --   --   BUN 7   < > 22* 26* 25*   < > 8 <5* <5* 10  --   CREATININE 1.29*   < > 1.30* 1.31* 1.23*   < > 1.04* 0.98 0.97 1.2*  --   CALCIUM 9.0   < > 9.1 9.2 9.1   < > 7.8* 7.8* 7.8*  --   --   MG 1.5*   < > 1.7 2.0 2.2  --   --   --   --   --   --   PHOS 3.7  --  4.1  --  4.4  --   --   --   --   --   --   TSH  --   --   --   --   --   --   --   --   --   --  0.41   < > = values in this interval not displayed.   Liver Function Tests: Recent Labs    08/29/17 0546 08/31/17 0355 09/15/17 1629 10/03/17  AST 12* 12* 19 10*  ALT 8* 8* 16 11  ALKPHOS 67 71 104 244*  BILITOT 0.8 0.6 1.1  --   PROT 6.3* 6.5 8.5*  --   ALBUMIN 3.0* 3.1* 4.2  --    Recent Labs    08/22/17 1053 09/15/17 1629  LIPASE 37 35   No results for input(s): AMMONIA in the last 8760 hours. CBC: Recent Labs    09/23/17 0432 09/24/17 0448 09/25/17 0541 10/03/17 11/22/17 1507  WBC 7.9 8.6 8.9 10.0 7.7  NEUTROABS 5.2  --   --  7,910 4,374  HGB 7.8* 8.9* 8.4* 7.7* 11.6*  HCT 23.9* 27.1* 25.7* 25* 35.8  MCV 81.8 80.4 80.6  --  82.1  PLT 271 310 328 440* 233   Lipid Panel: Recent Labs    08/29/17 0552 11/22/17 1507  CHOL  --  186  HDL  --  48*  LDLCALC  --  98  TRIG 218* 279*  CHOLHDL  --  3.9   Lab Results  Component Value Date   HGBA1C 6.9 (H) 11/22/2017    Procedures since last visit: Ct Abdomen Pelvis Wo Contrast  Result Date: 04/18/2018 CLINICAL DATA:  Nonhealing surgical wound from possible colostomy surgery. Left-sided pain. EXAM: CT ABDOMEN AND PELVIS WITHOUT  CONTRAST TECHNIQUE: Multidetector CT imaging of the abdomen and pelvis was performed following the standard protocol without IV contrast. COMPARISON:  08/22/2017. FINDINGS: Lower chest: Chronic atelectasis in the lingula, partially imaged. Slight pleural thickening or subpleural atelectasis in the base of the left hemithorax. Heart is enlarged. No pericardial or pleural effusion. Hepatobiliary: Liver is enlarged, measuring 23.7 cm. There is associated parenchymal heterogeneity, specially in the left hepatic lobe. Slight marginal irregularity. Stones in the gallbladder. No biliary ductal dilatation. Pancreas: Negative. Spleen: Negative. Adrenals/Urinary Tract: Fluid density right adrenal nodule measures 2.1 cm. Left adrenal gland and kidneys are unremarkable. Ureters are decompressed. Bladder is grossly unremarkable. Stomach/Bowel: Stomach small bowel are unremarkable. Subtotal colectomy with areas of presumed peristalsis in the residual rectosigmoid  colon, rather than strictures. Vascular/Lymphatic: Atherosclerotic calcification of the arterial vasculature without abdominal aortic aneurysm. Periportal lymph nodes measure up to 10 mm, similar. Reproductive: Hysterectomy.  No adnexal mass. Other: There is soft tissue thickening at the umbilicus (series 2, image 51). High-density postoperative material is seen along the ventral abdominal wall, extending to just above the umbilicus. No definite extraluminal oral contrast. Tiny fat containing hernia along the right lateral ventral pelvic wall, likely related to prior ostomy. Mesenteries and peritoneum are otherwise unremarkable. No free fluid. Musculoskeletal: Degenerative changes in the spine, sacroiliac joints and symphysis pubis. No worrisome lytic or sclerotic lesions. IMPRESSION: 1. Slight soft tissue thickening at the umbilicus, without extraluminal contrast to suggest a draining fistula. 2. Tiny residual fat containing hernia at the site of previous right lower  quadrant ostomy. 3. Hepatomegaly with probable steatosis. Slight marginal irregularity is suspicious for cirrhosis. 4. Cholelithiasis. 5. Right adrenal adenoma. 6.  Aortic atherosclerosis (ICD10-170.0). Electronically Signed   By: Lorin Picket M.D.   On: 04/18/2018 09:17   ECG OBTAINED AND REVIEWED BY MYSELF: SR @ 78 bpm, LAD, LAE, PACs, LBBB, T wave inverted lateral leads. No acute ischemic changes. No change since Feb 2019.  Assessment/Plan   ICD-10-CM   1. Preoperative clearance Z01.818 EKG 12-Lead    Ambulatory referral to Cardiology  2. Non-healing surgical wound, initial encounter T81.89XA   3. Type 2 diabetes mellitus with stage 3 chronic kidney disease, with long-term current use of insulin (HCC) E11.22 Hemoglobin A1c   N18.3    Z79.4   4. Need for influenza vaccination Z23 Flu vaccine HIGH DOSE PF (Fluzone High dose)  5. SOB (shortness of breath) R06.02 DG Chest 2 View  6. Hypothyroidism due to acquired atrophy of thyroid E03.4 TSH  7. Chronic systolic heart failure ([place]) I50.22 Ambulatory referral to Cardiology    DG Chest 2 View  8. S/P partial colectomy Z90.49   9. Hyperlipidemia associated with type 2 diabetes mellitus ([place]) E11.69    E78.5    Await xray results and labs prior to surgical clearance  Continue current medications as ordered  Continue wound care as directed per surgery  Will call with cardiology referral  Follow up with specialists as scheduled - please get last office note/labs from nephrology [facility])  Recommend Wilfred Lacy, NP at [facility]  Follow up in 3 mos for routine visit     Krimson Massmann S. Perlie Gold  [facility] [address] [place], [place] [address] [phone] Cell (Monday-Friday 8 AM - 5 PM) [phone] After 5 PM and follow prompts

## 2018-05-05 ENCOUNTER — Encounter: Payer: Self-pay | Admitting: Internal Medicine

## 2018-05-05 LAB — TSH: TSH: 0.53 m[IU]/L (ref 0.40–4.50)

## 2018-05-05 LAB — HEMOGLOBIN A1C
HEMOGLOBIN A1C: 10 %{Hb} — AB (ref <5.7)
Mean Plasma Glucose: 240 (calc)
eAG (mmol/L): 13.3 (calc)

## 2018-05-07 ENCOUNTER — Other Ambulatory Visit: Payer: Self-pay

## 2018-05-07 MED ORDER — INSULIN GLULISINE 100 UNIT/ML SOLOSTAR PEN: 25.0000 [IU] | PEN_INJECTOR | Freq: Three times a day (TID) | SUBCUTANEOUS | 5 refills | Status: DC

## 2018-05-07 MED ORDER — TEMAZEPAM 15 MG PO CAPS: 15.0000 mg | ORAL_CAPSULE | Freq: Every evening | ORAL | 0 refills | Status: DC | PRN

## 2018-05-08 ENCOUNTER — Ambulatory Visit
Admission: RE | Admit: 2018-05-08 | Discharge: 2018-05-08 | Disposition: A | Discharge status: Home / Self Care | Payer: Medicare Other | Source: Ambulatory Visit | Attending: Internal Medicine | Admitting: Internal Medicine

## 2018-05-08 DIAGNOSIS — R0602 Shortness of breath: Secondary | ICD-10-CM

## 2018-05-08 DIAGNOSIS — I5022 Chronic systolic (congestive) heart failure: Secondary | ICD-10-CM

## 2018-05-08 DIAGNOSIS — I509 Heart failure, unspecified: Secondary | ICD-10-CM | POA: Diagnosis not present

## 2018-05-08 IMAGING — DX DG ABDOMEN ACUTE W/ 1V CHEST
4 series · 4 of 4 positions shown · non-contrast
Comparison: None.

CLINICAL DATA: Abdominal pain.  Colostomy reversal in [REDACTED]

EXAM:
DG ABDOMEN ACUTE W/ 1V CHEST

[chest pa]
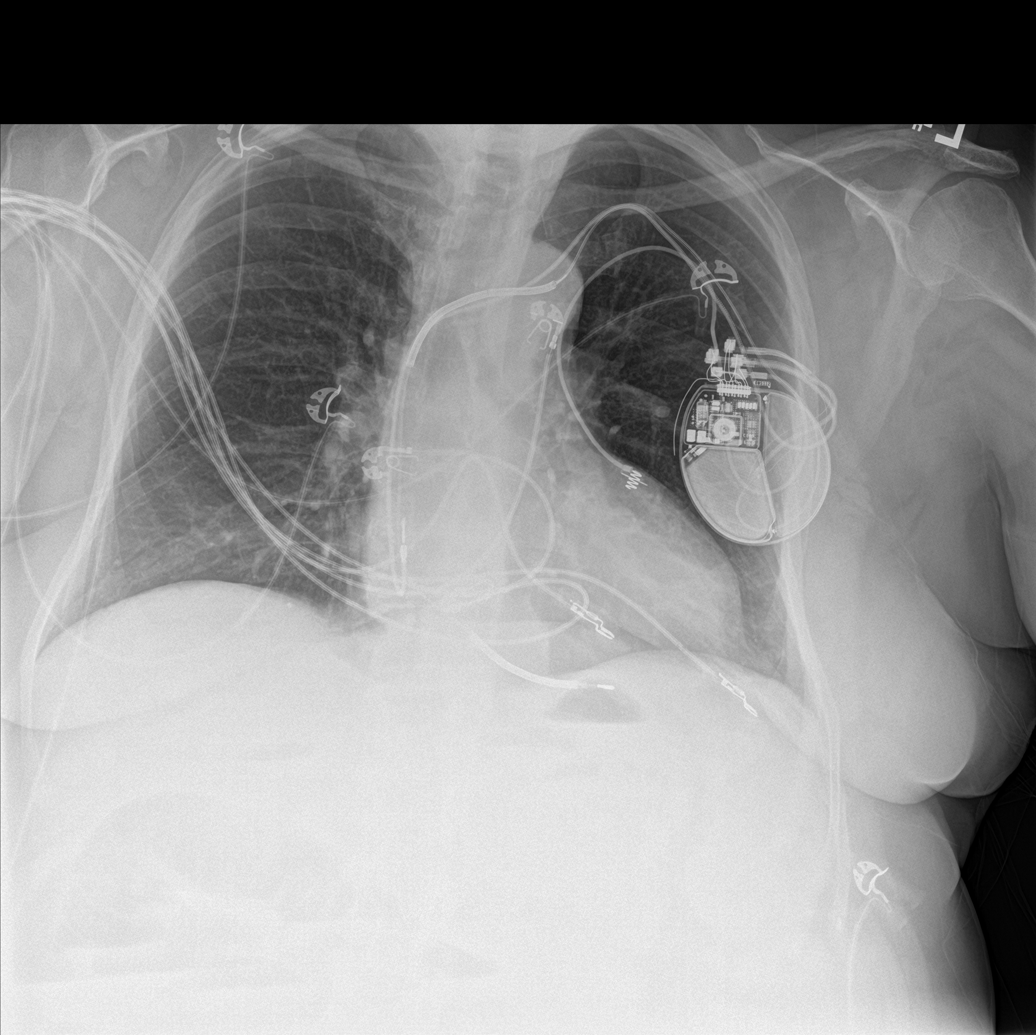

[abdomen erect]
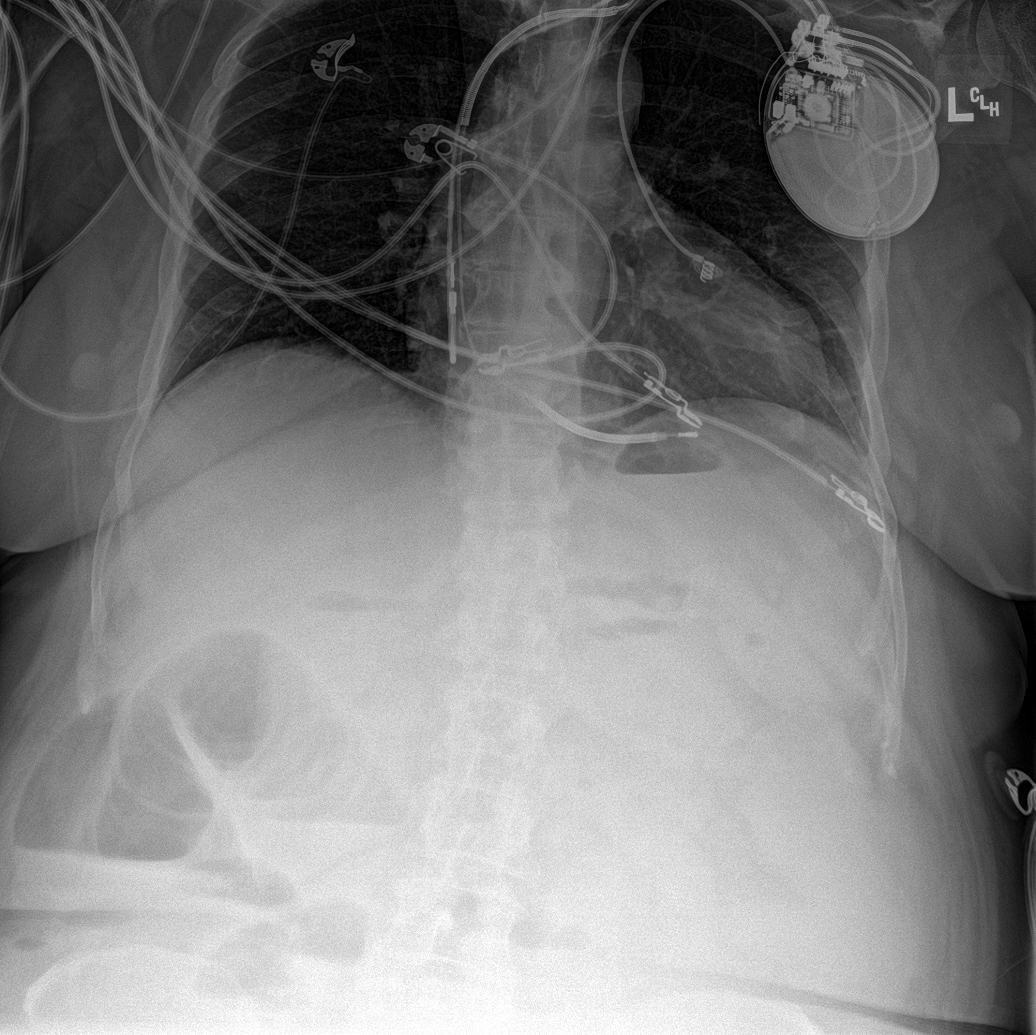

[abdomen supine (1 of 2)]
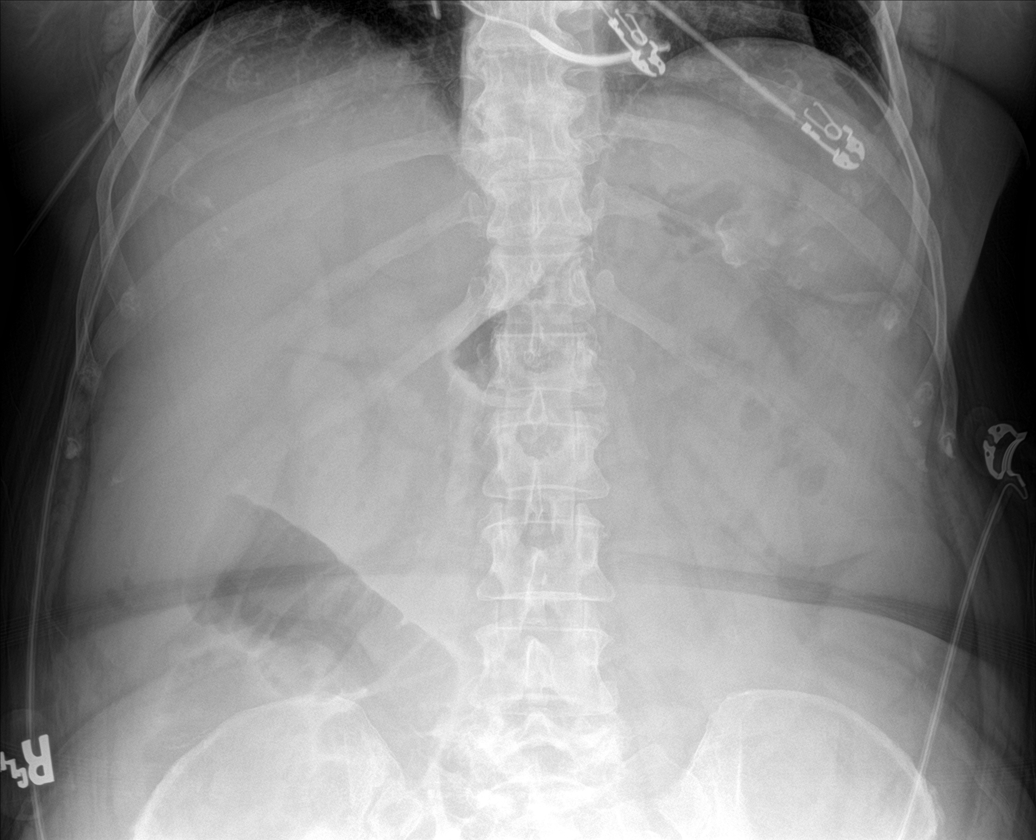

[abdomen supine (2 of 2)]
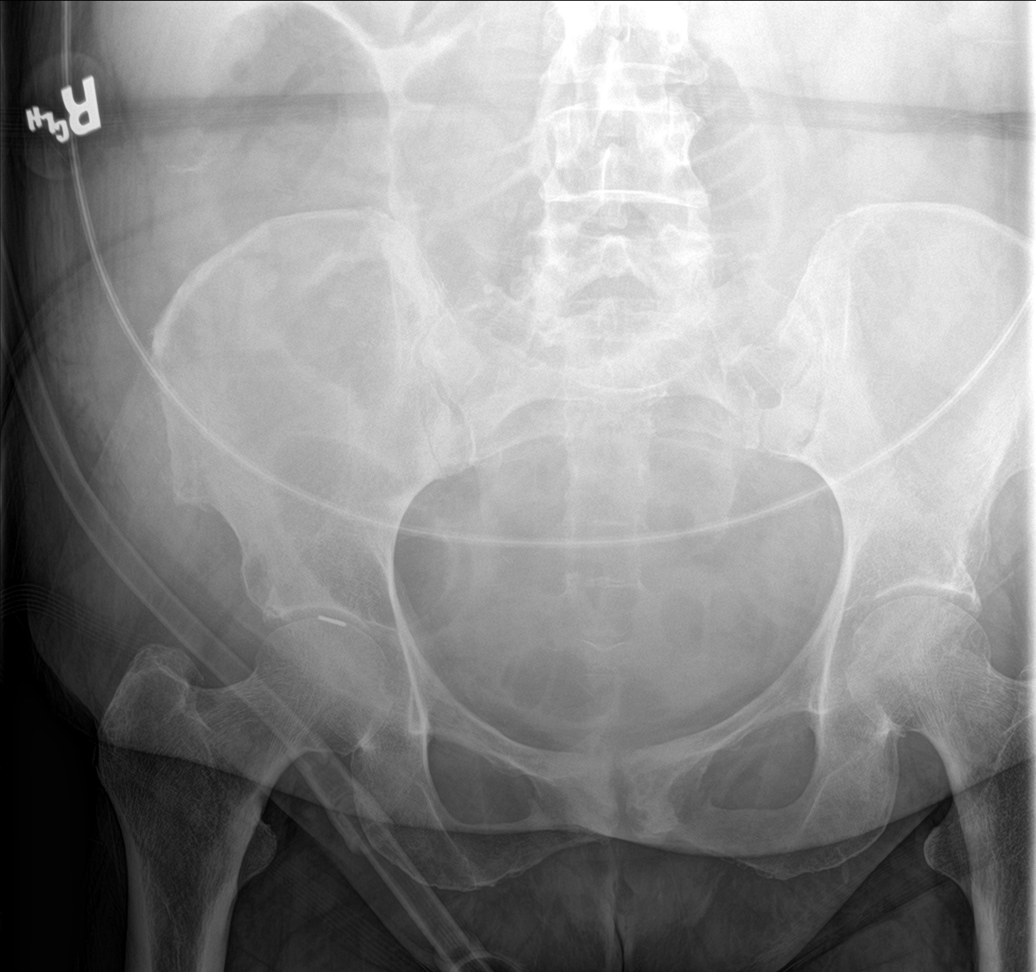

[4 of 4 positions shown; findings below may reference images not displayed]

FINDINGS: Multiple dilated loops of small bowel with air-fluid levels
concerning for bowel obstruction. No radiopaque calculi or other
significant radiographic abnormality is seen. Heart size and
mediastinal contours are within normal limits. Both lungs are clear.
Dual lead cardiac pacemaker.
IMPRESSION: Multiple dilated loops of small bowel with air-fluid levels
concerning for bowel obstruction.

## 2018-05-11 IMAGING — DX DG CHEST 1V PORT
1 series · 1 of 1 positions shown · non-contrast
Comparison: Chest x-ray dated 09/15/2017.

CLINICAL DATA: MANGYU PLACEMENT DURING ABDOMINAL SURGERY

EXAM:
PORTABLE CHEST 1 VIEW

[chest ap]
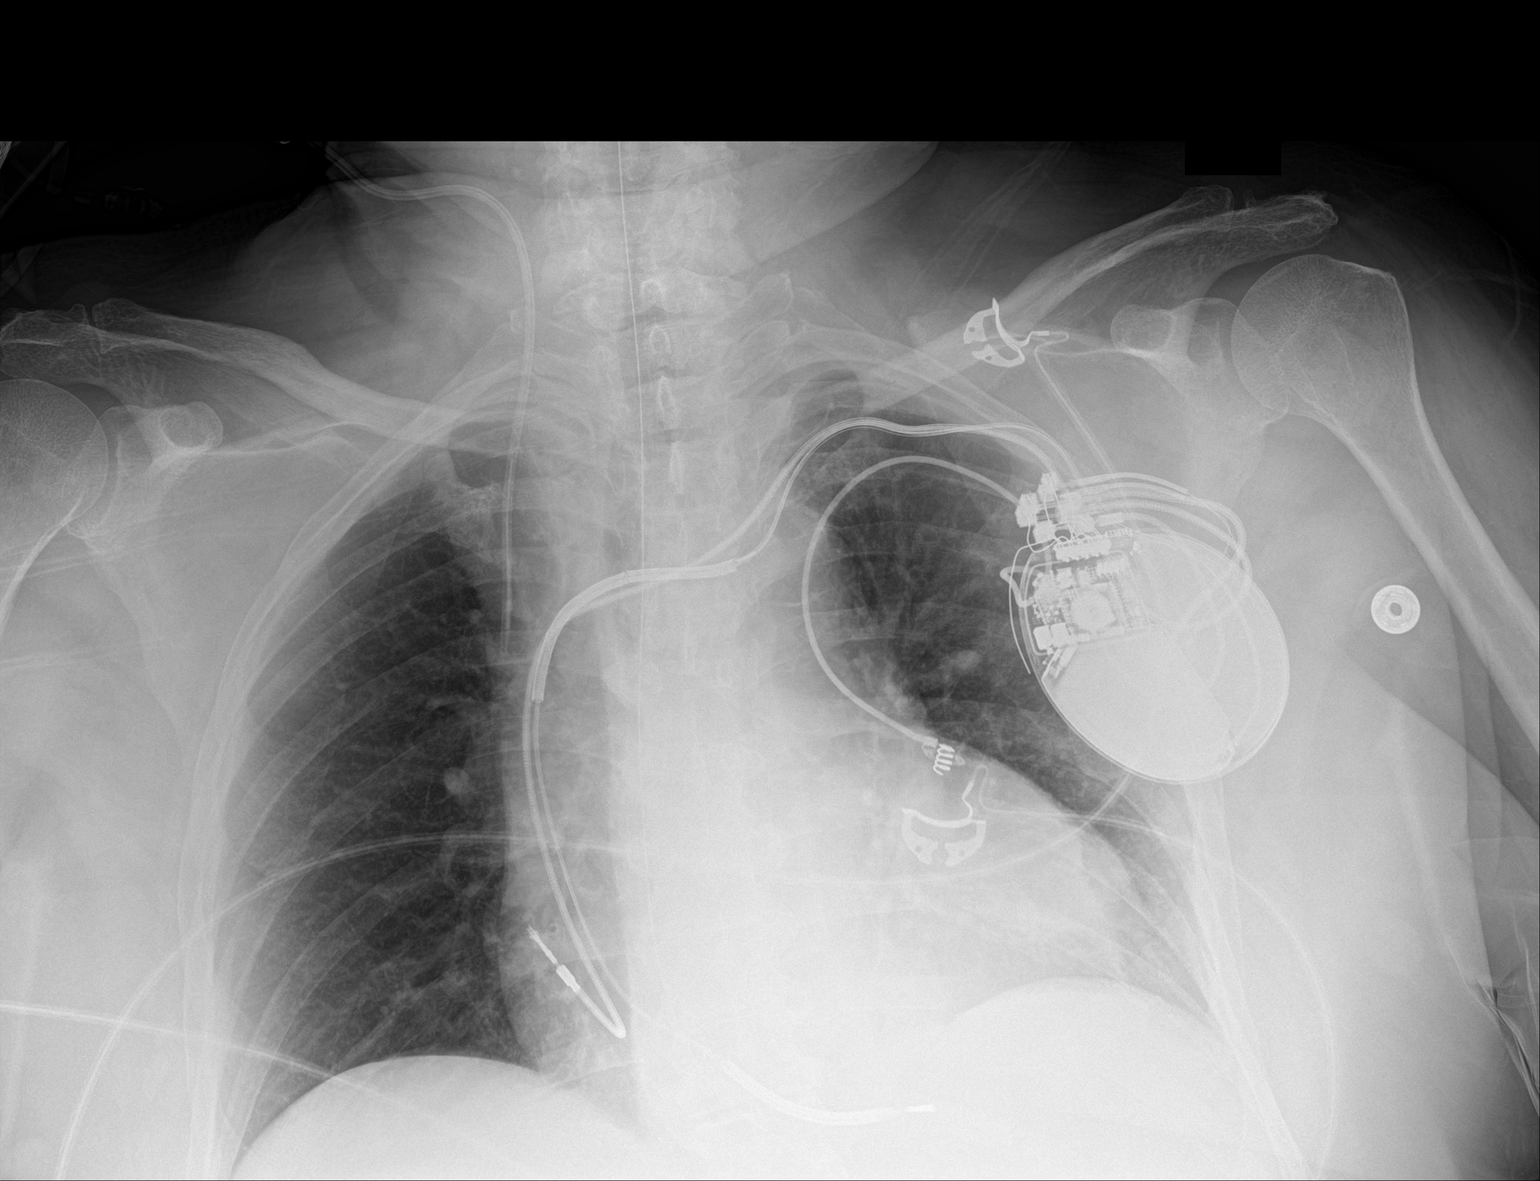

[1 of 1 positions shown; findings below may reference images not displayed]

FINDINGS: Stable cardiomegaly. Left chest wall pacemaker leads appear grossly
stable in position. Right IJ central line is now in place with tip
at the level of the upper SVC. Enteric tube passes below the
diaphragm.

Lungs are clear.  No pleural effusion or pneumothorax seen.
IMPRESSION: 1. Right IJ central line appears adequately positioned with tip at
the level of the upper SVC.
2. Stable cardiomegaly.
3. Lungs are clear.  No pleural effusion or pneumothorax seen.

## 2018-05-14 ENCOUNTER — Telehealth: Payer: Self-pay | Admitting: Cardiology

## 2018-05-14 NOTE — Telephone Encounter (Signed)
New message  Per the  patient's daughter the patient would like to be released from Dr. Nelly Laurence care because the patient now lives in [place], [place]. The patient would now like to now see Dr. Irish Lack. Please advise.

## 2018-05-15 NOTE — Telephone Encounter (Signed)
Ok for me

## 2018-05-17 ENCOUNTER — Telehealth: Payer: Self-pay | Admitting: Interventional Cardiology

## 2018-05-17 DIAGNOSIS — M25562 Pain in left knee: Secondary | ICD-10-CM | POA: Diagnosis not present

## 2018-05-17 NOTE — Telephone Encounter (Signed)
New message  Per the  patient's daughter the patient would like to be released from Dr. Nelly Laurence care because the patient now lives in [place], [place]. The patient would now like to now see Dr. Irish Lack. Please advise.  Dr. Harl Bowie has said ok. Please advise.

## 2018-05-18 ENCOUNTER — Other Ambulatory Visit: Payer: Self-pay | Admitting: Internal Medicine

## 2018-05-18 NOTE — Telephone Encounter (Signed)
OK with me.

## 2018-05-21 ENCOUNTER — Other Ambulatory Visit: Payer: Self-pay | Admitting: Internal Medicine

## 2018-05-21 DIAGNOSIS — M1A09X0 Idiopathic chronic gout, multiple sites, without tophus (tophi): Secondary | ICD-10-CM

## 2018-05-23 ENCOUNTER — Telehealth: Payer: Self-pay

## 2018-05-23 ENCOUNTER — Ambulatory Visit (INDEPENDENT_AMBULATORY_CARE_PROVIDER_SITE_OTHER): Payer: Medicare Other | Admitting: Interventional Cardiology

## 2018-05-23 ENCOUNTER — Encounter: Payer: Self-pay | Admitting: Interventional Cardiology

## 2018-05-23 VITALS — BP 132/62 | HR 81 | Ht 66.0 in | Wt 225.6 lb

## 2018-05-23 DIAGNOSIS — I10 Essential (primary) hypertension: Secondary | ICD-10-CM | POA: Diagnosis not present

## 2018-05-23 DIAGNOSIS — E782 Mixed hyperlipidemia: Secondary | ICD-10-CM

## 2018-05-23 DIAGNOSIS — I428 Other cardiomyopathies: Secondary | ICD-10-CM | POA: Diagnosis not present

## 2018-05-23 DIAGNOSIS — R0602 Shortness of breath: Secondary | ICD-10-CM | POA: Diagnosis not present

## 2018-05-23 NOTE — Patient Instructions (Signed)
Medication Instructions:  Your physician recommends that you continue on your current medications as directed. Please refer to the Current Medication list given to you today.  If you need a refill on your cardiac medications before your next appointment, please call your pharmacy.   Lab work: TODAY: BMET, pro-BNP If you have labs (blood work) drawn today and your tests are completely normal, you will receive your results only by: Marland Kitchen MyChart Message (if you have MyChart) OR . A paper copy in the mail If you have any lab test that is abnormal or we need to change your treatment, we will call you to review the results.  Testing/Procedures: Your physician has requested that you have an echocardiogram. Echocardiography is a painless test that uses sound waves to create images of your heart. It provides your doctor with information about the size and shape of your heart and how well your heart's chambers and valves are working. This procedure takes approximately one hour. There are no restrictions for this procedure.  Follow-Up: . Based on test results  Any Other Special Instructions Will Be Listed Below (If Applicable).  Echocardiogram An echocardiogram, or echocardiography, uses sound waves (ultrasound) to produce an image of your heart. The echocardiogram is simple, painless, obtained within a short period of time, and offers valuable information to your health care provider. The images from an echocardiogram can provide information such as:  Evidence of coronary artery disease (CAD).  Heart size.  Heart muscle function.  Heart valve function.  Aneurysm detection.  Evidence of a past heart attack.  Fluid buildup around the heart.  Heart muscle thickening.  Assess heart valve function.  Tell a health care provider about:  Any allergies you have.  All medicines you are taking, including vitamins, herbs, eye drops, creams, and over-the-counter medicines.  Any problems you or  family members have had with anesthetic medicines.  Any blood disorders you have.  Any surgeries you have had.  Any medical conditions you have.  Whether you are pregnant or may be pregnant. What happens before the procedure? No special preparation is needed. Eat and drink normally. What happens during the procedure?  In order to produce an image of your heart, gel will be applied to your chest and a wand-like tool (transducer) will be moved over your chest. The gel will help transmit the sound waves from the transducer. The sound waves will harmlessly bounce off your heart to allow the heart images to be captured in real-time motion. These images will then be recorded.  You may need an IV to receive a medicine that improves the quality of the pictures. What happens after the procedure? You may return to your normal schedule including diet, activities, and medicines, unless your health care provider tells you otherwise. This information is not intended to replace advice given to you by your health care provider. Make sure you discuss any questions you have with your health care provider. Document Released: 07/08/2000 Document Revised: 02/27/2016 Document Reviewed: 03/18/2013 Elsevier Interactive Patient Education  2017 [employer].

## 2018-05-23 NOTE — H&P (View-Only) (Signed)
Cardiology Office Note   Date:  05/23/2018   ID:  Crystal [name], DOB 1951/04/07, MRN [id number]  PCP:  Gildardo Cranker, DO    No chief complaint on file.  NICM  Wt Readings from Last 3 Encounters:  05/23/18 225 lb 9.6 oz (102.3 kg)  05/04/18 217 lb (98.4 kg)  11/22/17 203 lb (92.1 kg)       History of Present Illness: Crystal [name] is a 67 y.o. female  With a h/o, per the old chart,: "Chronic systolic heart failure  - long hx of NICM dating back to 2005, with reported LVEF at that time 24% - cath at that time showed no significant CAD.  - She has a chronic LBBB. Had a BiV AICD placed previously with epicardial LV lead with subsequent normalization of LVEF - LVEF improved with BiV pacing to >55%.  - from notes device reached ERI, her RV lead has increasing thresholds around that time. It was decided not to replace the device since normal LVEF and follow her clinically.   - repeat echo 04/2014 LVEF 50-55% - questionable hx of angioedema on ACE-Is, she states has been on ARBs and has tolerated. "  She has CKD as well.   She had echo, cath in 2018 in [place], [place].  No CAD nad EF 50% per her report.  SHe has had other medical problems.  She had a colostomy for colitis.  It has been reversed.    She has been gaining weight since that time.  She has gained 30 lbs.  SHe has felt [facility] as well.     She was on Bumex in the past but it was stopped when she was in the hospital.  She started some lasix on her own and it helped a little    Past Medical History:  Diagnosis Date  . AICD (automatic cardioverter/defibrillator) present    high RV threshold chronically, device was turned off in 2014; Device battery has been dead x 7 years  . Anemia, iron deficiency   . Angioedema    felt to likely be due to ace inhibitors but says she has had this even off of medicines, appears to be tolerating ARBs chronically  . Anxiety   . Arthritis    "hands, legs, arms" (07/13/2017)   . Asthmatic bronchitis with status asthmaticus   . Bradycardia   . CHF (congestive heart failure) ([place])   . Chronic lower back pain   . Chronic pain   . Chronic renal insufficiency   . CKD (chronic kidney disease), stage III ([place])   . Coronary artery disease    Mild nonobstructive (30% LAD, 30% RCA) by 09/01/16 cath at [facility]  . Degenerative joint disease   . Depression   . Diabetes mellitus, type 2 ([place])   . Diabetic peripheral neuropathy ([place])   . Dizziness   . Dyspnea on exertion   . Family history of adverse reaction to anesthesia    Mother has nausea  . GERD (gastroesophageal reflux disease)   . Gout   . Heart valve disorder   . History of blood transfusion 1981; ~ 2005; 12/2016   "childbirth; defibrillator OR; colostomy OR"  . History of mononucleosis 03/2014  . Hyperlipidemia   . Hypertension   . Hypothyroid   . Insomnia   . Intervertebral disc degeneration   . Ischemic cardiomyopathy   . LBBB (left bundle branch block)   . Myocardial infarction ([place]) dx'd ~ 2005  . Nonischemic  cardiomyopathy ([place])    s/p ICD in 2005. EF has since recovered  . Obesity   . On home oxygen therapy    "2L at night" (07/13/2017)  . OSA on CPAP   . Pneumonia    "couple times; last time was 12/2016" (07/13/2017)  . PONV (postoperative nausea and vomiting)   . Swelling   . Syncope   . Systemic hypertension   . Vitamin D deficiency     Past Surgical History:  Procedure Laterality Date  . APPENDECTOMY  07/13/2017  . BLADDER SUSPENSION  1990   "w/hysterectomy"  . CARDIAC CATHETERIZATION  2005   no obstructive CAD per patient  . CARDIAC CATHETERIZATION  2018  . CARDIAC DEFIBRILLATOR PLACEMENT  2005   BiV ICD implanted,  LV lead is an epicardial lead  . CARPAL TUNNEL RELEASE Left 07/25/2014   Dr.Williamson   . CATARACT EXTRACTION W/ INTRAOCULAR LENS  IMPLANT, BILATERAL Bilateral   . COLONIC STENT PLACEMENT N/A 08/31/2017   Procedure: COLONIC STENT PLACEMENT;  Surgeon:  Carol Ada, MD;  Location: [place];  Service: Endoscopy;  Laterality: N/A;  . COLONOSCOPY     2010-2011 Dr.Kipreos   . COLOSTOMY  12/2016   Archie Endo 01/26/2017  . COLOSTOMY REVERSAL  07/13/2017  . COLOSTOMY REVERSAL N/A 07/13/2017   Procedure: COLOSTOMY REVERSAL;  Surgeon: Judeth Horn, MD;  Location: [place];  Service: General;  Laterality: N/A;  . CYST REMOVAL HAND Right 05/2013   thumb  . DILATION AND CURETTAGE OF UTERUS  X 5-6  . FLEXIBLE SIGMOIDOSCOPY N/A 08/24/2017   Procedure: FLEXIBLE SIGMOIDOSCOPY;  Surgeon: Carol Ada, MD;  Location: [place];  Service: Endoscopy;  Laterality: N/A;  . FLEXIBLE SIGMOIDOSCOPY N/A 08/31/2017   Procedure: FLEXIBLE SIGMOIDOSCOPY;  Surgeon: Carol Ada, MD;  Location: [place];  Service: Endoscopy;  Laterality: N/A;  stent placement  . IMPLANTABLE CARDIOVERTER DEFIBRILLATOR GENERATOR CHANGE  2008  . LAPAROTOMY N/A 09/18/2017   Procedure: EXPLORATORY LAPAROTOMY;  Surgeon: Judeth Horn, MD;  Location: [place];  Service: General;  Laterality: N/A;  . LYSIS OF ADHESION N/A 09/18/2017   Procedure: LYSIS OF ADHESION;  Surgeon: Judeth Horn, MD;  Location: [place];  Service: General;  Laterality: N/A;  . PARTIAL COLECTOMY N/A 09/18/2017   Procedure: ILEOCOLECTOMY;  Surgeon: Judeth Horn, MD;  Location: [place];  Service: General;  Laterality: N/A;  . PARTIAL COLECTOMY  09/25/2017  . SIGMOIDOSCOPY N/A 09/18/2017   Procedure: SIGMOIDOSCOPY;  Surgeon: Judeth Horn, MD;  Location: [place];  Service: General;  Laterality: N/A;  . TOTAL ABDOMINAL HYSTERECTOMY  1990  . TRIGGER FINGER RELEASE Right 05/213  . TUBAL LIGATION  1980s  . VESICOVAGINAL FISTULA CLOSURE W/ TAH       Current Outpatient Medications  Medication Sig Dispense Refill  . acetaminophen (TYLENOL) 500 MG tablet Take 1,000 mg by mouth every 6 (six) hours as needed for moderate pain or headache.    . allopurinol (ZYLOPRIM) 100 MG tablet TAKE 1 TABLET(100 MG) BY MOUTH DAILY 90 tablet 1  .  Calcium Carbonate-Vitamin D (CALTRATE 600+D) 600-400 MG-UNIT tablet Take 1 tablet by mouth 2 (two) times daily.    . carvedilol (COREG) 12.5 MG tablet Take 12.5 mg by mouth 2 (two) times daily with a meal.    . CRESTOR 10 MG tablet Take 1 tablet (10 mg total) by mouth at bedtime. 30 tablet 0  . diclofenac sodium (VOLTAREN) 1 % GEL Apply 2 g topically as needed.  1  . DULoxetine (CYMBALTA) 30 MG  capsule TAKE ONE CAPSULE BY MOUTH DAILY 90 capsule 1  . gabapentin (NEURONTIN) 100 MG capsule TAKE 1 CAPSULE BY MOUTH EVERY MORNING 90 capsule 1  . gabapentin (NEURONTIN) 800 MG tablet TAKE 1 TABLET BY MOUTH DAILY AT BEDTIME 90 tablet 1  . Insulin Glargine (TOUJEO MAX SOLOSTAR) 300 UNIT/ML SOPN Inject 110 Units into the skin daily.    . Insulin Glulisine (APIDRA) 100 UNIT/ML Solostar Pen Inject 25 Units into the skin 3 (three) times daily. 15 mL 5  . levothyroxine (SYNTHROID, LEVOTHROID) 88 MCG tablet Take 88 mcg by mouth daily.     Marland Kitchen loratadine (CLARITIN) 10 MG tablet Take 10 mg by mouth daily.    Marland Kitchen losartan (COZAAR) 25 MG tablet TAKE 1 TABLET(25 MG) BY MOUTH DAILY 90 tablet 1  . montelukast (SINGULAIR) 10 MG tablet Take 10 mg by mouth at bedtime.    . multivitamin (PROSIGHT) TABS tablet Take 1 tablet by mouth daily. 30 each 0  . omega-3 acid ethyl esters (LOVAZA) 1 g capsule Take 4 g by mouth at bedtime.     . OXYGEN Inhale 2 L into the lungs.    . temazepam (RESTORIL) 15 MG capsule Take 1 capsule (15 mg total) by mouth at bedtime as needed for sleep. 30 capsule 0   No current facility-administered medications for this visit.     Allergies:   Glucophage [metformin hcl]; Ace inhibitors; Advicor [niacin-lovastatin er]; Bystolic [nebivolol hcl]; Erythromycin; Lipitor [atorvastatin]; Lopid [gemfibrozil]; Statins; Telmisartan; and Adhesive [tape]    Social History:  The patient  reports that she has never smoked. She has never used smokeless tobacco. She reports that she does not drink alcohol or use  drugs.   Family History:  The patient's family history includes Anxiety disorder in her son; Arrhythmia in her mother; Asthma in her mother; Atrial fibrillation in her daughter; Breast cancer in her maternal grandmother; Cancer in her brother and father; Dementia in her brother; Diabetes in her daughter; Diabetes Mellitus II in her mother and other; GER disease in her daughter; Heart disease in her father and other; Hypertension in her brother, daughter, daughter, daughter, father, mother, and son; Hypothyroidism in her son.    ROS:  Please see the history of present illness.   Otherwise, review of systems are positive for weight gain.   All other systems are reviewed and negative.    PHYSICAL EXAM: VS:  BP 132/62   Pulse 81   Ht 5\' 6"  (1.676 m)   Wt 225 lb 9.6 oz (102.3 kg)   SpO2 95%   BMI 36.41 kg/m  , BMI Body mass index is 36.41 kg/m. GEN: Well nourished, well developed, in no acute distress  HEENT: normal  Neck: no JVD, carotid bruits, or masses Cardiac: RRR; no murmurs, rubs, or gallops,no edema ; S1, S2 ,+ S3 Respiratory:  clear to auscultation bilaterally, normal work of breathing GI: soft, nontender, nondistended, + BS; obesity; dressing in place MS: no deformity or atrophy  Skin: warm and dry, no rash Neuro:  Strength and sensation are intact Psych: euthymic mood, full affect     Recent Labs: 09/02/2017: Magnesium 2.2 10/03/2017: ALT 11; BUN 10; Creatinine 1.2; Potassium 4.8; Sodium 138 11/22/2017: Hemoglobin 11.6; Platelets 233 05/04/2018: TSH 0.53   Lipid Panel    Component Value Date/Time   CHOL 186 11/22/2017 1507   TRIG 279 (H) 11/22/2017 1507   HDL 48 (L) 11/22/2017 1507   CHOLHDL 3.9 11/22/2017 1507   LDLCALC 98 11/22/2017 1507  Other studies Reviewed: Additional studies/ records that were reviewed today with results demonstrating: labs reviewed .   ASSESSMENT AND PLAN:  1. NICM: No evidence of volume overload in her lower extremities.  She does  have abdominal distention.  She had a CT scan which did not reveal fluid in her abdomen.  She does however have an S3 on exam and shortness of breath.  We will recheck echocardiogram to evaluate left ventricular ejection fraction.  If her EF is normal, I think she would be cleared for surgery.  She reports having a cardiac cath last year showing no significant coronary artery disease.  Check, BMet and BNP 2. Hyperlipidemia: LDL 98.  Continue lipid-lowering therapy.  Get cath records from [facility].  3. HTN: The current medical regimen is effective;  continue present plan and medications.    Current medicines are reviewed at length with the patient today.  The patient concerns regarding her medicines were addressed.  The following changes have been made:  No change  Labs/ tests ordered today include:  No orders of the defined types were placed in this encounter.   Recommend 150 minutes/week of aerobic exercise Low fat, low carb, high fiber diet recommended  Disposition:   FU in 1 year   Signed, Larae Grooms, MD  05/23/2018 2:13 PM    [facility] [address], [place], [place]  [address] Phone: [phone]; Fax: [phone]

## 2018-05-23 NOTE — Telephone Encounter (Signed)
Unable to pull up Covermymeds. Internet connection down x several hours. I am connected to Epic remotely. I called [phone] to initiate PA. Awaiting fax questionnaire.   S.Chrae B/CMA

## 2018-05-23 NOTE — Progress Notes (Signed)
Cardiology Office Note   Date:  05/23/2018   ID:  Crystal [name], DOB October 30, 1950, MRN [id number]  PCP:  Gildardo Cranker, DO    No chief complaint on file.  NICM  Wt Readings from Last 3 Encounters:  05/23/18 225 lb 9.6 oz (102.3 kg)  05/04/18 217 lb (98.4 kg)  11/22/17 203 lb (92.1 kg)       History of Present Illness: Crystal [name] is a 67 y.o. female  With a h/o, per the old chart,: "Chronic systolic heart failure  - long hx of NICM dating back to 2005, with reported LVEF at that time 24% - cath at that time showed no significant CAD.  - She has a chronic LBBB. Had a BiV AICD placed previously with epicardial LV lead with subsequent normalization of LVEF - LVEF improved with BiV pacing to >55%.  - from notes device reached ERI, her RV lead has increasing thresholds around that time. It was decided not to replace the device since normal LVEF and follow her clinically.   - repeat echo 04/2014 LVEF 50-55% - questionable hx of angioedema on ACE-Is, she states has been on ARBs and has tolerated. "  She has CKD as well.   She had echo, cath in 2018 in [place], [place].  No CAD nad EF 50% per her report.  SHe has had other medical problems.  She had a colostomy for colitis.  It has been reversed.    She has been gaining weight since that time.  She has gained 30 lbs.  SHe has felt [facility] as well.     She was on Bumex in the past but it was stopped when she was in the hospital.  She started some lasix on her own and it helped a little    Past Medical History:  Diagnosis Date  . AICD (automatic cardioverter/defibrillator) present    high RV threshold chronically, device was turned off in 2014; Device battery has been dead x 7 years  . Anemia, iron deficiency   . Angioedema    felt to likely be due to ace inhibitors but says she has had this even off of medicines, appears to be tolerating ARBs chronically  . Anxiety   . Arthritis    "hands, legs, arms" (07/13/2017)   . Asthmatic bronchitis with status asthmaticus   . Bradycardia   . CHF (congestive heart failure) ([place])   . Chronic lower back pain   . Chronic pain   . Chronic renal insufficiency   . CKD (chronic kidney disease), stage III ([place])   . Coronary artery disease    Mild nonobstructive (30% LAD, 30% RCA) by 09/01/16 cath [facility]  . Degenerative joint disease   . Depression   . Diabetes mellitus, type 2 ([place])   . Diabetic peripheral neuropathy ([place])   . Dizziness   . Dyspnea on exertion   . Family history of adverse reaction to anesthesia    Mother has nausea  . GERD (gastroesophageal reflux disease)   . Gout   . Heart valve disorder   . History of blood transfusion 1981; ~ 2005; 12/2016   "childbirth; defibrillator OR; colostomy OR"  . History of mononucleosis 03/2014  . Hyperlipidemia   . Hypertension   . Hypothyroid   . Insomnia   . Intervertebral disc degeneration   . Ischemic cardiomyopathy   . LBBB (left bundle branch block)   . Myocardial infarction ([place]) dx'd ~ 2005  . Nonischemic  cardiomyopathy ([place])    s/p ICD in 2005. EF has since recovered  . Obesity   . On home oxygen therapy    "2L at night" (07/13/2017)  . OSA on CPAP   . Pneumonia    "couple times; last time was 12/2016" (07/13/2017)  . PONV (postoperative nausea and vomiting)   . Swelling   . Syncope   . Systemic hypertension   . Vitamin D deficiency     Past Surgical History:  Procedure Laterality Date  . APPENDECTOMY  07/13/2017  . BLADDER SUSPENSION  1990   "w/hysterectomy"  . CARDIAC CATHETERIZATION  2005   no obstructive CAD per patient  . CARDIAC CATHETERIZATION  2018  . CARDIAC DEFIBRILLATOR PLACEMENT  2005   BiV ICD implanted,  LV lead is an epicardial lead  . CARPAL TUNNEL RELEASE Left 07/25/2014   Dr.Williamson   . CATARACT EXTRACTION W/ INTRAOCULAR LENS  IMPLANT, BILATERAL Bilateral   . COLONIC STENT PLACEMENT N/A 08/31/2017   Procedure: COLONIC STENT PLACEMENT;  Surgeon:  Carol Ada, MD;  Location: [place];  Service: Endoscopy;  Laterality: N/A;  . COLONOSCOPY     2010-2011 Dr.Kipreos   . COLOSTOMY  12/2016   Archie Endo 01/26/2017  . COLOSTOMY REVERSAL  07/13/2017  . COLOSTOMY REVERSAL N/A 07/13/2017   Procedure: COLOSTOMY REVERSAL;  Surgeon: Judeth Horn, MD;  Location: [place];  Service: General;  Laterality: N/A;  . CYST REMOVAL HAND Right 05/2013   thumb  . DILATION AND CURETTAGE OF UTERUS  X 5-6  . FLEXIBLE SIGMOIDOSCOPY N/A 08/24/2017   Procedure: FLEXIBLE SIGMOIDOSCOPY;  Surgeon: Carol Ada, MD;  Location: [place];  Service: Endoscopy;  Laterality: N/A;  . FLEXIBLE SIGMOIDOSCOPY N/A 08/31/2017   Procedure: FLEXIBLE SIGMOIDOSCOPY;  Surgeon: Carol Ada, MD;  Location: [place];  Service: Endoscopy;  Laterality: N/A;  stent placement  . IMPLANTABLE CARDIOVERTER DEFIBRILLATOR GENERATOR CHANGE  2008  . LAPAROTOMY N/A 09/18/2017   Procedure: EXPLORATORY LAPAROTOMY;  Surgeon: Judeth Horn, MD;  Location: [place];  Service: General;  Laterality: N/A;  . LYSIS OF ADHESION N/A 09/18/2017   Procedure: LYSIS OF ADHESION;  Surgeon: Judeth Horn, MD;  Location: [place];  Service: General;  Laterality: N/A;  . PARTIAL COLECTOMY N/A 09/18/2017   Procedure: ILEOCOLECTOMY;  Surgeon: Judeth Horn, MD;  Location: [place];  Service: General;  Laterality: N/A;  . PARTIAL COLECTOMY  09/25/2017  . SIGMOIDOSCOPY N/A 09/18/2017   Procedure: SIGMOIDOSCOPY;  Surgeon: Judeth Horn, MD;  Location: [place];  Service: General;  Laterality: N/A;  . TOTAL ABDOMINAL HYSTERECTOMY  1990  . TRIGGER FINGER RELEASE Right 05/213  . TUBAL LIGATION  1980s  . VESICOVAGINAL FISTULA CLOSURE W/ TAH       Current Outpatient Medications  Medication Sig Dispense Refill  . acetaminophen (TYLENOL) 500 MG tablet Take 1,000 mg by mouth every 6 (six) hours as needed for moderate pain or headache.    . allopurinol (ZYLOPRIM) 100 MG tablet TAKE 1 TABLET(100 MG) BY MOUTH DAILY 90 tablet 1  .  Calcium Carbonate-Vitamin D (CALTRATE 600+D) 600-400 MG-UNIT tablet Take 1 tablet by mouth 2 (two) times daily.    . carvedilol (COREG) 12.5 MG tablet Take 12.5 mg by mouth 2 (two) times daily with a meal.    . CRESTOR 10 MG tablet Take 1 tablet (10 mg total) by mouth at bedtime. 30 tablet 0  . diclofenac sodium (VOLTAREN) 1 % GEL Apply 2 g topically as needed.  1  . DULoxetine (CYMBALTA) 30 MG  capsule TAKE ONE CAPSULE BY MOUTH DAILY 90 capsule 1  . gabapentin (NEURONTIN) 100 MG capsule TAKE 1 CAPSULE BY MOUTH EVERY MORNING 90 capsule 1  . gabapentin (NEURONTIN) 800 MG tablet TAKE 1 TABLET BY MOUTH DAILY AT BEDTIME 90 tablet 1  . Insulin Glargine (TOUJEO MAX SOLOSTAR) 300 UNIT/ML SOPN Inject 110 Units into the skin daily.    . Insulin Glulisine (APIDRA) 100 UNIT/ML Solostar Pen Inject 25 Units into the skin 3 (three) times daily. 15 mL 5  . levothyroxine (SYNTHROID, LEVOTHROID) 88 MCG tablet Take 88 mcg by mouth daily.     Marland Kitchen loratadine (CLARITIN) 10 MG tablet Take 10 mg by mouth daily.    Marland Kitchen losartan (COZAAR) 25 MG tablet TAKE 1 TABLET(25 MG) BY MOUTH DAILY 90 tablet 1  . montelukast (SINGULAIR) 10 MG tablet Take 10 mg by mouth at bedtime.    . multivitamin (PROSIGHT) TABS tablet Take 1 tablet by mouth daily. 30 each 0  . omega-3 acid ethyl esters (LOVAZA) 1 g capsule Take 4 g by mouth at bedtime.     . OXYGEN Inhale 2 L into the lungs.    . temazepam (RESTORIL) 15 MG capsule Take 1 capsule (15 mg total) by mouth at bedtime as needed for sleep. 30 capsule 0   No current facility-administered medications for this visit.     Allergies:   Glucophage [metformin hcl]; Ace inhibitors; Advicor [niacin-lovastatin er]; Bystolic [nebivolol hcl]; Erythromycin; Lipitor [atorvastatin]; Lopid [gemfibrozil]; Statins; Telmisartan; and Adhesive [tape]    Social History:  The patient  reports that she has never smoked. She has never used smokeless tobacco. She reports that she does not drink alcohol or use  drugs.   Family History:  The patient's family history includes Anxiety disorder in her son; Arrhythmia in her mother; Asthma in her mother; Atrial fibrillation in her daughter; Breast cancer in her maternal grandmother; Cancer in her brother and father; Dementia in her brother; Diabetes in her daughter; Diabetes Mellitus II in her mother and other; GER disease in her daughter; Heart disease in her father and other; Hypertension in her brother, daughter, daughter, daughter, father, mother, and son; Hypothyroidism in her son.    ROS:  Please see the history of present illness.   Otherwise, review of systems are positive for weight gain.   All other systems are reviewed and negative.    PHYSICAL EXAM: VS:  BP 132/62   Pulse 81   Ht 5\' 6"  (1.676 m)   Wt 225 lb 9.6 oz (102.3 kg)   SpO2 95%   BMI 36.41 kg/m  , BMI Body mass index is 36.41 kg/m. GEN: Well nourished, well developed, in no acute distress  HEENT: normal  Neck: no JVD, carotid bruits, or masses Cardiac: RRR; no murmurs, rubs, or gallops,no edema ; S1, S2 ,+ S3 Respiratory:  clear to auscultation bilaterally, normal work of breathing GI: soft, nontender, nondistended, + BS; obesity; dressing in place MS: no deformity or atrophy  Skin: warm and dry, no rash Neuro:  Strength and sensation are intact Psych: euthymic mood, full affect     Recent Labs: 09/02/2017: Magnesium 2.2 10/03/2017: ALT 11; BUN 10; Creatinine 1.2; Potassium 4.8; Sodium 138 11/22/2017: Hemoglobin 11.6; Platelets 233 05/04/2018: TSH 0.53   Lipid Panel    Component Value Date/Time   CHOL 186 11/22/2017 1507   TRIG 279 (H) 11/22/2017 1507   HDL 48 (L) 11/22/2017 1507   CHOLHDL 3.9 11/22/2017 1507   LDLCALC 98 11/22/2017 1507  Other studies Reviewed: Additional studies/ records that were reviewed today with results demonstrating: labs reviewed .   ASSESSMENT AND PLAN:  1. NICM: No evidence of volume overload in her lower extremities.  She does  have abdominal distention.  She had a CT scan which did not reveal fluid in her abdomen.  She does however have an S3 on exam and shortness of breath.  We will recheck echocardiogram to evaluate left ventricular ejection fraction.  If her EF is normal, I think she would be cleared for surgery.  She reports having a cardiac cath last year showing no significant coronary artery disease.  Check, BMet and BNP 2. Hyperlipidemia: LDL 98.  Continue lipid-lowering therapy.  Get cath records from [place].  3. HTN: The current medical regimen is effective;  continue present plan and medications.    Current medicines are reviewed at length with the patient today.  The patient concerns regarding her medicines were addressed.  The following changes have been made:  No change  Labs/ tests ordered today include:  No orders of the defined types were placed in this encounter.   Recommend 150 minutes/week of aerobic exercise Low fat, low carb, high fiber diet recommended  Disposition:   FU in 1 year   Signed, Larae Grooms, MD  05/23/2018 2:13 PM    [facility] [place], [place], [place]  [address] Phone: [phone]; Fax: [phone]

## 2018-05-23 NOTE — Telephone Encounter (Addendum)
I called [facility], spoke with Rob. Patients BIN Number is [id number]  I will complete Prior Authorization via Covermymeds

## 2018-05-24 ENCOUNTER — Other Ambulatory Visit: Payer: Self-pay | Admitting: Internal Medicine

## 2018-05-24 DIAGNOSIS — E1122 Type 2 diabetes mellitus with diabetic chronic kidney disease: Secondary | ICD-10-CM

## 2018-05-24 DIAGNOSIS — Z794 Long term (current) use of insulin: Principal | ICD-10-CM

## 2018-05-24 DIAGNOSIS — N183 Chronic kidney disease, stage 3 (moderate): Principal | ICD-10-CM

## 2018-05-24 LAB — BASIC METABOLIC PANEL
BUN/Creatinine Ratio: 18 (ref 12–28)
BUN: 27 mg/dL (ref 8–27)
CALCIUM: 8.9 mg/dL (ref 8.7–10.3)
CO2: 24 mmol/L (ref 20–29)
CREATININE: 1.54 mg/dL — AB (ref 0.57–1.00)
Chloride: 101 mmol/L (ref 96–106)
GFR calc non Af Amer: 34 mL/min/{1.73_m2} — ABNORMAL LOW (ref >59)
GFR, EST AFRICAN AMERICAN: 40 mL/min/{1.73_m2} — AB (ref >59)
Glucose: 156 mg/dL — ABNORMAL HIGH (ref 65–99)
Potassium: 4 mmol/L (ref 3.5–5.2)
Sodium: 142 mmol/L (ref 134–144)

## 2018-05-24 LAB — PRO B NATRIURETIC PEPTIDE: NT-Pro BNP: 948 pg/mL — ABNORMAL HIGH (ref 0–301)

## 2018-05-25 ENCOUNTER — Ambulatory Visit (HOSPITAL_COMMUNITY): Payer: Medicare Other | Attending: Interventional Cardiology

## 2018-05-25 ENCOUNTER — Other Ambulatory Visit: Payer: Self-pay

## 2018-05-25 DIAGNOSIS — R0602 Shortness of breath: Secondary | ICD-10-CM | POA: Diagnosis not present

## 2018-05-25 DIAGNOSIS — I428 Other cardiomyopathies: Secondary | ICD-10-CM

## 2018-05-25 MED ORDER — PERFLUTREN LIPID MICROSPHERE
1.0000 mL | INTRAVENOUS | Status: AC | PRN
  Administered 2018-05-25: 3 mL via INTRAVENOUS

## 2018-05-28 ENCOUNTER — Telehealth: Payer: Self-pay

## 2018-05-28 DIAGNOSIS — I429 Cardiomyopathy, unspecified: Secondary | ICD-10-CM

## 2018-05-28 DIAGNOSIS — I428 Other cardiomyopathies: Secondary | ICD-10-CM

## 2018-05-28 DIAGNOSIS — Z01812 Encounter for preprocedural laboratory examination: Secondary | ICD-10-CM

## 2018-05-28 NOTE — Telephone Encounter (Signed)
Patient has been made aware of her lab results and her echo results. Made patient aware that she will need a right heart cath and that we are waiting to get her records to review her left heart cath that she had done in [place] to determine if she will need both left and right heart cath. Made patient aware that since her kidney function is slightly impaired, if the report for her left heart cath comes back normal that we will only do a right heart cath to avoid giving her contrast.   Patient previously had a BiV AICD with epicardial LV lead that she subsequently had normalization of her LVEF. Her device had reached ERI and was not replaced. Made patient aware that since she has had a decrease in her EF that we are going to refer her to EP.   Made patient aware that she will be contacted by the EP scheduler to schedule an appointment with one of the EP MDs. Also made patient aware that I would be in touch with her regarding scheduling her heart cath once I received her records. Patient verbalized understanding to all instruction and thanked me for the call.

## 2018-05-28 NOTE — Telephone Encounter (Signed)
-----   Message from Jettie Booze, MD sent at 05/28/2018 10:01 AM EST ----- EF has dropped again.  WIll need to refer back to EP to reconsider pacing with LV lead.

## 2018-05-28 NOTE — Telephone Encounter (Signed)
-----   Message from Jettie Booze, MD sent at 05/25/2018  4:59 PM EDT ----- Worsening renal function and increased BNP.  Need to verify left heart cath results.  May need right heart cath based on echo results.

## 2018-05-29 NOTE — Telephone Encounter (Signed)
Follow up:   Patient returning call from yesterday concerning results.

## 2018-05-29 NOTE — Telephone Encounter (Addendum)
Returned call to patient. Patietn states that she needs to have surgery and wanted to know if she would be able to have her surgery or if she will have to wait until she is seen by EP and has her heart cath before she could have it. Made patient aware that since her EF has dropped again that she will need to be seen by EP first. Patient has been scheduled with Dr. Caryl Comes on 11/18. Made patient aware that we did receive her left heart cath report from [place] and I would have Dr. Irish Lack review it and give recommendation on proceeding with her heart cath. Patient verbalized understanding.

## 2018-05-31 ENCOUNTER — Other Ambulatory Visit: Payer: Medicare Other | Admitting: *Deleted

## 2018-05-31 DIAGNOSIS — I429 Cardiomyopathy, unspecified: Secondary | ICD-10-CM | POA: Diagnosis not present

## 2018-05-31 DIAGNOSIS — Z01812 Encounter for preprocedural laboratory examination: Secondary | ICD-10-CM | POA: Diagnosis not present

## 2018-05-31 LAB — CBC
HEMATOCRIT: 40.4 % (ref 34.0–46.6)
HEMOGLOBIN: 13.4 g/dL (ref 11.1–15.9)
MCH: 26.8 pg (ref 26.6–33.0)
MCHC: 33.2 g/dL (ref 31.5–35.7)
MCV: 81 fL (ref 79–97)
Platelets: 243 10*3/uL (ref 150–450)
RBC: 5 x10E6/uL (ref 3.77–5.28)
RDW: 15.8 % — AB (ref 12.3–15.4)
WBC: 12.2 10*3/uL — AB (ref 3.4–10.8)

## 2018-05-31 NOTE — Telephone Encounter (Signed)
Called and made patient aware that Dr. Irish Lack did review her [place] on 09/01/16 from the [place] and he would like to do a LHC since it has been almost 2 years in addition to the [place]. Dr. Irish Lack is able to do this tomorrow at 12:00 PM. He will do H&P at that time. Patient agrees to have it done tomorrow and understands that she needs to arrive at 10:00 AM. Patient had BMET done on 10/30 that showed elevated Cr and BUN of 34. Patient has EF 25-30%. Per Dr. Irish Lack, patient does not need to come in early for hydration. Patient will come in today and have STAT CBC done. Reviewed pre-cath instructions below with the patient. Letter has been printed and patient will pick it up when she comes in for labs this afternoon. Patient verbalized understanding to all instructions and is in agreement with this plan. LHC report from [place] was given to medical records to be scanned to the patient's chart.    [facility] OFFICE [place], [place] [address] Dept: [phone] Loc: [place]  05/31/2018  You are scheduled for a Cardiac Catheterization on Friday, November 8 with Dr. Larae Grooms.  1. Please arrive at the [facility] (Main Entrance A) at [facility]: [address] [place], [place] [address] at 10:00 AM (This time is two hours before your procedure to ensure your preparation). Free valet parking service is available.   Special note: Every effort is made to have your procedure done on time. Please understand that emergencies sometimes delay scheduled procedures.  2. Diet: Do not eat solid foods after midnight.  The patient may have clear liquids until 5am upon the day of the procedure.  3. Labs: CBC TODAY, BMET done on 10/30  4. Medication instructions in preparation for your procedure:   Contrast Allergy: No   NO INSULIN the morning of your procedure  NO LASIX the morning of  your procedure  On the morning of your procedure, take a baby Aspirin 81 mg and any morning medicines NOT listed above.  You may use sips of water.  5. Plan for one night stay--bring personal belongings. 6. Bring a current list of your medications and current insurance cards. 7. You MUST have a responsible person to drive you home. 8. Someone MUST be with you the first 24 hours after you arrive home or your discharge will be delayed. 9. Please wear clothes that are easy to get on and off and wear slip-on shoes.  Thank you for allowing [place] to care for you!   -- [facility]

## 2018-06-01 ENCOUNTER — Encounter (HOSPITAL_COMMUNITY)
Admission: RE | Disposition: A | Discharge status: Home / Self Care | Payer: Self-pay | Source: Ambulatory Visit | Attending: Interventional Cardiology

## 2018-06-01 ENCOUNTER — Ambulatory Visit (HOSPITAL_COMMUNITY)
Admission: RE | Admit: 2018-06-01 | Discharge: 2018-06-01 | Disposition: A | Discharge status: Home / Self Care | Payer: Medicare Other | Source: Ambulatory Visit | Attending: Interventional Cardiology | Admitting: Interventional Cardiology

## 2018-06-01 ENCOUNTER — Other Ambulatory Visit: Payer: Self-pay

## 2018-06-01 DIAGNOSIS — I447 Left bundle-branch block, unspecified: Secondary | ICD-10-CM | POA: Diagnosis not present

## 2018-06-01 DIAGNOSIS — Z95810 Presence of automatic (implantable) cardiac defibrillator: Secondary | ICD-10-CM | POA: Diagnosis not present

## 2018-06-01 DIAGNOSIS — Z6836 Body mass index (BMI) 36.0-36.9, adult: Secondary | ICD-10-CM | POA: Diagnosis not present

## 2018-06-01 DIAGNOSIS — G4733 Obstructive sleep apnea (adult) (pediatric): Secondary | ICD-10-CM | POA: Diagnosis not present

## 2018-06-01 DIAGNOSIS — N183 Chronic kidney disease, stage 3 (moderate): Secondary | ICD-10-CM | POA: Insufficient documentation

## 2018-06-01 DIAGNOSIS — G4700 Insomnia, unspecified: Secondary | ICD-10-CM | POA: Diagnosis not present

## 2018-06-01 DIAGNOSIS — I428 Other cardiomyopathies: Secondary | ICD-10-CM | POA: Diagnosis not present

## 2018-06-01 DIAGNOSIS — E669 Obesity, unspecified: Secondary | ICD-10-CM | POA: Insufficient documentation

## 2018-06-01 DIAGNOSIS — M109 Gout, unspecified: Secondary | ICD-10-CM | POA: Insufficient documentation

## 2018-06-01 DIAGNOSIS — J45909 Unspecified asthma, uncomplicated: Secondary | ICD-10-CM | POA: Insufficient documentation

## 2018-06-01 DIAGNOSIS — I5021 Acute systolic (congestive) heart failure: Secondary | ICD-10-CM | POA: Diagnosis not present

## 2018-06-01 DIAGNOSIS — E1142 Type 2 diabetes mellitus with diabetic polyneuropathy: Secondary | ICD-10-CM | POA: Diagnosis not present

## 2018-06-01 DIAGNOSIS — I130 Hypertensive heart and chronic kidney disease with heart failure and stage 1 through stage 4 chronic kidney disease, or unspecified chronic kidney disease: Secondary | ICD-10-CM | POA: Insufficient documentation

## 2018-06-01 DIAGNOSIS — Z9981 Dependence on supplemental oxygen: Secondary | ICD-10-CM | POA: Diagnosis not present

## 2018-06-01 DIAGNOSIS — E039 Hypothyroidism, unspecified: Secondary | ICD-10-CM | POA: Diagnosis not present

## 2018-06-01 DIAGNOSIS — D509 Iron deficiency anemia, unspecified: Secondary | ICD-10-CM | POA: Diagnosis not present

## 2018-06-01 DIAGNOSIS — M545 Low back pain: Secondary | ICD-10-CM | POA: Insufficient documentation

## 2018-06-01 DIAGNOSIS — K219 Gastro-esophageal reflux disease without esophagitis: Secondary | ICD-10-CM | POA: Diagnosis not present

## 2018-06-01 DIAGNOSIS — E785 Hyperlipidemia, unspecified: Secondary | ICD-10-CM | POA: Diagnosis not present

## 2018-06-01 DIAGNOSIS — I252 Old myocardial infarction: Secondary | ICD-10-CM | POA: Insufficient documentation

## 2018-06-01 DIAGNOSIS — E1122 Type 2 diabetes mellitus with diabetic chronic kidney disease: Secondary | ICD-10-CM | POA: Insufficient documentation

## 2018-06-01 DIAGNOSIS — I255 Ischemic cardiomyopathy: Secondary | ICD-10-CM | POA: Diagnosis not present

## 2018-06-01 DIAGNOSIS — G8929 Other chronic pain: Secondary | ICD-10-CM | POA: Insufficient documentation

## 2018-06-01 DIAGNOSIS — I5023 Acute on chronic systolic (congestive) heart failure: Secondary | ICD-10-CM | POA: Insufficient documentation

## 2018-06-01 DIAGNOSIS — F329 Major depressive disorder, single episode, unspecified: Secondary | ICD-10-CM | POA: Diagnosis not present

## 2018-06-01 DIAGNOSIS — M1990 Unspecified osteoarthritis, unspecified site: Secondary | ICD-10-CM | POA: Insufficient documentation

## 2018-06-01 DIAGNOSIS — Z8249 Family history of ischemic heart disease and other diseases of the circulatory system: Secondary | ICD-10-CM | POA: Insufficient documentation

## 2018-06-01 DIAGNOSIS — F419 Anxiety disorder, unspecified: Secondary | ICD-10-CM | POA: Insufficient documentation

## 2018-06-01 DIAGNOSIS — Z794 Long term (current) use of insulin: Secondary | ICD-10-CM | POA: Insufficient documentation

## 2018-06-01 HISTORY — PX: RIGHT/LEFT HEART CATH AND CORONARY ANGIOGRAPHY: CATH118266

## 2018-06-01 LAB — POCT I-STAT 3, VENOUS BLOOD GAS (G3P V)
Acid-Base Excess: 1 mmol/L (ref 0.0–2.0)
Acid-Base Excess: 1 mmol/L (ref 0.0–2.0)
Bicarbonate: 28.9 mmol/L — ABNORMAL HIGH (ref 20.0–28.0)
Bicarbonate: 29 mmol/L — ABNORMAL HIGH (ref 20.0–28.0)
O2 Saturation: 65 %
O2 Saturation: 67 %
PCO2 VEN: 59.2 mmHg (ref 44.0–60.0)
PH VEN: 7.296 (ref 7.250–7.430)
PO2 VEN: 39 mmHg (ref 32.0–45.0)
TCO2: 31 mmol/L (ref 22–32)
TCO2: 31 mmol/L (ref 22–32)
pCO2, Ven: 59.3 mmHg (ref 44.0–60.0)
pH, Ven: 7.298 (ref 7.250–7.430)
pO2, Ven: 38 mmHg (ref 32.0–45.0)

## 2018-06-01 LAB — POCT I-STAT 3, ART BLOOD GAS (G3+)
Bicarbonate: 27.5 mmol/L (ref 20.0–28.0)
O2 Saturation: 98 %
PH ART: 7.305 — AB (ref 7.350–7.450)
TCO2: 29 mmol/L (ref 22–32)
pCO2 arterial: 55.2 mmHg — ABNORMAL HIGH (ref 32.0–48.0)
pO2, Arterial: 117 mmHg — ABNORMAL HIGH (ref 83.0–108.0)

## 2018-06-01 LAB — BASIC METABOLIC PANEL
ANION GAP: 7 (ref 5–15)
BUN: 34 mg/dL — ABNORMAL HIGH (ref 8–23)
CHLORIDE: 104 mmol/L (ref 98–111)
CO2: 28 mmol/L (ref 22–32)
Calcium: 9 mg/dL (ref 8.9–10.3)
Creatinine, Ser: 1.81 mg/dL — ABNORMAL HIGH (ref 0.44–1.00)
GFR calc non Af Amer: 28 mL/min — ABNORMAL LOW (ref >60)
GFR, EST AFRICAN AMERICAN: 32 mL/min — AB (ref >60)
Glucose, Bld: 185 mg/dL — ABNORMAL HIGH (ref 70–99)
Potassium: 3.8 mmol/L (ref 3.5–5.1)
Sodium: 139 mmol/L (ref 135–145)

## 2018-06-01 LAB — POCT I-STAT, CHEM 8
BUN: 37 mg/dL — AB (ref 8–23)
CREATININE: 1.9 mg/dL — AB (ref 0.44–1.00)
Calcium, Ion: 1.22 mmol/L (ref 1.15–1.40)
Chloride: 104 mmol/L (ref 98–111)
Glucose, Bld: 185 mg/dL — ABNORMAL HIGH (ref 70–99)
HEMATOCRIT: 41 % (ref 36.0–46.0)
Hemoglobin: 13.9 g/dL (ref 12.0–15.0)
POTASSIUM: 4 mmol/L (ref 3.5–5.1)
Sodium: 141 mmol/L (ref 135–145)
TCO2: 29 mmol/L (ref 22–32)

## 2018-06-01 LAB — GLUCOSE, CAPILLARY
GLUCOSE-CAPILLARY: 131 mg/dL — AB (ref 70–99)
Glucose-Capillary: 182 mg/dL — ABNORMAL HIGH (ref 70–99)

## 2018-06-01 SURGERY — RIGHT/LEFT HEART CATH AND CORONARY ANGIOGRAPHY
Anesthesia: LOCAL

## 2018-06-01 MED ORDER — SODIUM CHLORIDE 0.9% FLUSH: 3.0000 mL | Freq: Two times a day (BID) | INTRAVENOUS | Status: DC

## 2018-06-01 MED ORDER — SODIUM CHLORIDE 0.9 % IV SOLN
INTRAVENOUS | Status: DC
  Administered 2018-06-01: 11:00:00 via INTRAVENOUS

## 2018-06-01 MED ORDER — IOHEXOL 350 MG/ML SOLN
INTRAVENOUS | Status: DC | PRN
  Administered 2018-06-01: 25 mL via INTRACARDIAC

## 2018-06-01 MED ORDER — HEPARIN (PORCINE) IN NACL 1000-0.9 UT/500ML-% IV SOLN
INTRAVENOUS | Status: AC
  Filled 2018-06-01: qty 1000

## 2018-06-01 MED ORDER — SODIUM CHLORIDE 0.9 % IV SOLN: 250.0000 mL | INTRAVENOUS | Status: DC | PRN

## 2018-06-01 MED ORDER — VERAPAMIL HCL 2.5 MG/ML IV SOLN
INTRAVENOUS | Status: DC | PRN
  Administered 2018-06-01: 10 mL via INTRA_ARTERIAL

## 2018-06-01 MED ORDER — ACETAMINOPHEN 325 MG PO TABS: 650.0000 mg | ORAL_TABLET | ORAL | Status: DC | PRN

## 2018-06-01 MED ORDER — SODIUM CHLORIDE 0.9% FLUSH: 3.0000 mL | INTRAVENOUS | Status: DC | PRN

## 2018-06-01 MED ORDER — FENTANYL CITRATE (PF) 100 MCG/2ML IJ SOLN
INTRAMUSCULAR | Status: DC | PRN
  Administered 2018-06-01: 25 ug via INTRAVENOUS

## 2018-06-01 MED ORDER — LIDOCAINE HCL (PF) 1 % IJ SOLN
INTRAMUSCULAR | Status: DC | PRN
  Administered 2018-06-01 (×2): 2 mL

## 2018-06-01 MED ORDER — MIDAZOLAM HCL 2 MG/2ML IJ SOLN
INTRAMUSCULAR | Status: DC | PRN
  Administered 2018-06-01: 1 mg via INTRAVENOUS

## 2018-06-01 MED ORDER — HEPARIN (PORCINE) IN NACL 1000-0.9 UT/500ML-% IV SOLN
INTRAVENOUS | Status: DC | PRN
  Administered 2018-06-01 (×2): 500 mL

## 2018-06-01 MED ORDER — HEPARIN SODIUM (PORCINE) 1000 UNIT/ML IJ SOLN
INTRAMUSCULAR | Status: AC
  Filled 2018-06-01: qty 1

## 2018-06-01 MED ORDER — HEPARIN SODIUM (PORCINE) 1000 UNIT/ML IJ SOLN
INTRAMUSCULAR | Status: DC | PRN
  Administered 2018-06-01: 5000 [IU] via INTRAVENOUS

## 2018-06-01 MED ORDER — MIDAZOLAM HCL 2 MG/2ML IJ SOLN
INTRAMUSCULAR | Status: AC
  Filled 2018-06-01: qty 2

## 2018-06-01 MED ORDER — FENTANYL CITRATE (PF) 100 MCG/2ML IJ SOLN
INTRAMUSCULAR | Status: AC
  Filled 2018-06-01: qty 2

## 2018-06-01 MED ORDER — VERAPAMIL HCL 2.5 MG/ML IV SOLN
INTRAVENOUS | Status: AC
  Filled 2018-06-01: qty 2

## 2018-06-01 MED ORDER — ASPIRIN 81 MG PO CHEW: 81.0000 mg | CHEWABLE_TABLET | ORAL | Status: DC

## 2018-06-01 MED ORDER — LIDOCAINE HCL (PF) 1 % IJ SOLN
INTRAMUSCULAR | Status: AC
  Filled 2018-06-01: qty 30

## 2018-06-01 SURGICAL SUPPLY — 12 items

## 2018-06-01 NOTE — Discharge Instructions (Signed)
° °  DRINK PLENTY OF FLUIDS FOR THE NEXT 2-3 DAYS TO KEEP HYDRATED. ° °Radial Site Care °Refer to this sheet in the next few weeks. These instructions provide you with information about caring for yourself after your procedure. Your health care provider may also give you more specific instructions. Your treatment has been planned according to current medical practices, but problems sometimes occur. Call your health care provider if you have any problems or questions after your procedure. °What can I expect after the procedure? °After your procedure, it is typical to have the following: °· Bruising at the radial site that usually fades within 1-2 weeks. °· Blood collecting in the tissue (hematoma) that may be painful to the touch. It should usually decrease in size and tenderness within 1-2 weeks. ° °Follow these instructions at home: °· Take medicines only as directed by your health care provider. °· You may shower 24-48 hours after the procedure or as directed by your health care provider. Remove the bandage (dressing) and gently wash the site with plain soap and water. Pat the area dry with a clean towel. Do not rub the site, because this may cause bleeding. °· Do not take baths, swim, or use a hot tub until your health care provider approves. °· Check your insertion site every day for redness, swelling, or drainage. °· Do not apply powder or lotion to the site. °· Do not flex or bend the affected arm for 24 hours or as directed by your health care provider. °· Do not push or pull heavy objects with the affected arm for 24 hours or as directed by your health care provider. °· Do not lift over 10 lb (4.5 kg) for 5 days after your procedure or as directed by your health care provider. °· Ask your health care provider when it is okay to: °? Return to work or school. °? Resume usual physical activities or sports. °? Resume sexual activity. °· Do not drive home if you are discharged the same day as the procedure. Have  someone else drive you. °· You may drive 24 hours after the procedure unless otherwise instructed by your health care provider. °· Do not operate machinery or power tools for 24 hours after the procedure. °· If your procedure was done as an outpatient procedure, which means that you went home the same day as your procedure, a responsible adult should be with you for the first 24 hours after you arrive home. °· Keep all follow-up visits as directed by your health care provider. This is important. °Contact a health care provider if: °· You have a fever. °· You have chills. °· You have increased bleeding from the radial site. Hold pressure on the site. °Get help right away if: °· You have unusual pain at the radial site. °· You have redness, warmth, or swelling at the radial site. °· You have drainage (other than a small amount of blood on the dressing) from the radial site. °· The radial site is bleeding, and the bleeding does not stop after 30 minutes of holding steady pressure on the site. °· Your arm or hand becomes pale, cool, tingly, or numb. °This information is not intended to replace advice given to you by your health care provider. Make sure you discuss any questions you have with your health care provider. °Document Released: 08/13/2010 Document Revised: 12/17/2015 Document Reviewed: 01/27/2014 °Elsevier Interactive Patient Education © 2018 [employer] ° °

## 2018-06-01 NOTE — Interval H&P Note (Signed)
Cath Lab Visit (complete for each Cath Lab visit)  Clinical Evaluation Leading to the Procedure:   ACS: No.  Non-ACS:    Anginal Classification: CCS III  Anti-ischemic medical therapy: Maximal Therapy (2 or more classes of medications)  Non-Invasive Test Results: High-risk stress test findings: cardiac mortality >3%/year low EF by echo  Prior CABG: No previous CABG  Cardiac catheterization was discussed with the patient fully. The patient understands that risks include but are not limited to stroke (1 in 1000), death (1 in 74), kidney failure [usually temporary] (1 in 500), bleeding (1 in 200), allergic reaction [possibly serious] (1 in 200).  The patient understands and is willing to proceed.    Need to eval why EF has dropped.       History and Physical Interval Note:  06/01/2018 12:10 PM  Crystal [name]  has presented today for surgery, with the diagnosis of abnormal echo, cardiomyopathy  The various methods of treatment have been discussed with the patient and family. After consideration of risks, benefits and other options for treatment, the patient has consented to  Procedure(s): RIGHT/LEFT HEART CATH AND CORONARY ANGIOGRAPHY (N/A) as a surgical intervention .  The patient's history has been reviewed, patient examined, no change in status, stable for surgery.  I have reviewed the patient's chart and labs.  Questions were answered to the patient's satisfaction.     Larae Grooms

## 2018-06-03 ENCOUNTER — Other Ambulatory Visit: Payer: Self-pay | Admitting: Nurse Practitioner

## 2018-06-04 ENCOUNTER — Encounter (HOSPITAL_COMMUNITY): Payer: Self-pay | Admitting: Interventional Cardiology

## 2018-06-06 NOTE — Progress Notes (Signed)
Jay - QRS is 113ms so suspect she has LBBB cardiomyopathy. Has she been referred back to EP? Happy to see her if needed.

## 2018-06-07 DIAGNOSIS — H524 Presbyopia: Secondary | ICD-10-CM | POA: Diagnosis not present

## 2018-06-07 DIAGNOSIS — H538 Other visual disturbances: Secondary | ICD-10-CM | POA: Diagnosis not present

## 2018-06-07 DIAGNOSIS — E119 Type 2 diabetes mellitus without complications: Secondary | ICD-10-CM | POA: Diagnosis not present

## 2018-06-07 DIAGNOSIS — H40019 Open angle with borderline findings, low risk, unspecified eye: Secondary | ICD-10-CM | POA: Diagnosis not present

## 2018-06-07 DIAGNOSIS — H26493 Other secondary cataract, bilateral: Secondary | ICD-10-CM | POA: Diagnosis not present

## 2018-06-07 DIAGNOSIS — H52203 Unspecified astigmatism, bilateral: Secondary | ICD-10-CM | POA: Diagnosis not present

## 2018-06-11 ENCOUNTER — Encounter: Payer: Self-pay | Admitting: Internal Medicine

## 2018-06-11 ENCOUNTER — Encounter: Payer: Self-pay | Admitting: *Deleted

## 2018-06-11 ENCOUNTER — Ambulatory Visit (INDEPENDENT_AMBULATORY_CARE_PROVIDER_SITE_OTHER): Payer: Medicare Other | Admitting: Internal Medicine

## 2018-06-11 ENCOUNTER — Encounter

## 2018-06-11 VITALS — BP 122/80 | HR 77 | Ht 66.0 in | Wt 227.2 lb

## 2018-06-11 DIAGNOSIS — I428 Other cardiomyopathies: Secondary | ICD-10-CM

## 2018-06-11 DIAGNOSIS — I429 Cardiomyopathy, unspecified: Secondary | ICD-10-CM | POA: Diagnosis not present

## 2018-06-11 DIAGNOSIS — Z01812 Encounter for preprocedural laboratory examination: Secondary | ICD-10-CM | POA: Diagnosis not present

## 2018-06-11 LAB — BASIC METABOLIC PANEL
BUN / CREAT RATIO: 19 (ref 12–28)
BUN: 34 mg/dL — ABNORMAL HIGH (ref 8–27)
CALCIUM: 10 mg/dL (ref 8.7–10.3)
CO2: 26 mmol/L (ref 20–29)
CREATININE: 1.77 mg/dL — AB (ref 0.57–1.00)
Chloride: 98 mmol/L (ref 96–106)
GFR calc Af Amer: 34 mL/min/{1.73_m2} — ABNORMAL LOW (ref >59)
GFR, EST NON AFRICAN AMERICAN: 29 mL/min/{1.73_m2} — AB (ref >59)
Glucose: 143 mg/dL — ABNORMAL HIGH (ref 65–99)
POTASSIUM: 4.4 mmol/L (ref 3.5–5.2)
SODIUM: 140 mmol/L (ref 134–144)

## 2018-06-11 LAB — CBC WITH DIFFERENTIAL/PLATELET
BASOS: 1 %
Basophils Absolute: 0.1 10*3/uL (ref 0.0–0.2)
EOS (ABSOLUTE): 0.2 10*3/uL (ref 0.0–0.4)
EOS: 3 %
HEMATOCRIT: 37.2 % (ref 34.0–46.6)
Hemoglobin: 12 g/dL (ref 11.1–15.9)
IMMATURE GRANULOCYTES: 0 %
Immature Grans (Abs): 0 10*3/uL (ref 0.0–0.1)
Lymphocytes Absolute: 2 10*3/uL (ref 0.7–3.1)
Lymphs: 21 %
MCH: 26.4 pg — ABNORMAL LOW (ref 26.6–33.0)
MCHC: 32.3 g/dL (ref 31.5–35.7)
MCV: 82 fL (ref 79–97)
MONOS ABS: 0.8 10*3/uL (ref 0.1–0.9)
Monocytes: 8 %
NEUTROS ABS: 6.4 10*3/uL (ref 1.4–7.0)
Neutrophils: 67 %
Platelets: 218 10*3/uL (ref 150–450)
RBC: 4.54 x10E6/uL (ref 3.77–5.28)
RDW: 14.3 % (ref 12.3–15.4)
WBC: 9.5 10*3/uL (ref 3.4–10.8)

## 2018-06-11 MED ORDER — TORSEMIDE 20 MG PO TABS: ORAL_TABLET | ORAL | 6 refills | Status: DC

## 2018-06-11 MED ORDER — SACUBITRIL-VALSARTAN 24-26 MG PO TABS: 1.0000 | ORAL_TABLET | Freq: Two times a day (BID) | ORAL | 6 refills | Status: DC

## 2018-06-11 MED ORDER — POTASSIUM CHLORIDE CRYS ER 20 MEQ PO TBCR: 20.0000 meq | EXTENDED_RELEASE_TABLET | Freq: Every day | ORAL | 3 refills | Status: DC

## 2018-06-11 NOTE — Progress Notes (Signed)
ELECTROPHYSIOLOGY CONSULT NOTE  Patient ID: FLANNERY CAVALLERO, MRN: [id number], DOB/AGE: [birth year] 67 y.o. Admit date: (Not on file) Date of Consult: 06/11/2018  Primary Physician: Gildardo Cranker, DO Primary Cardiologist: Johney Frame is a 67 y.o. female who is being seen today for the evaluation of device replacement at the request of Dr Saundra Shelling.    HPI HAVILAH TOPOR is a 67 y.o. female   Referred for consideration of reimplantation of a biventricular defibrillator.  She has a history of nonischemic myopathy dating back to 2005.  EF at that time was 24% catheterization showed no obstructive coronary disease and she had left bundle branch block.    She underwent biventricular ICD implantation with epicardial lead; the battery depleted and underwent device generator reimplantation about 2009.  About 2015 her second device reached ERI.  With interval normalization the decision was made [facility]) not to reimplant; her QRS duration 10/15 was less than 120 ms  Multiple ECGs were reviewed from 2017--2019.  Even with rates as low as 75 left bundle branch block was present ECG 10/19 demonstrated recurrent left bundle branch block  DATE TEST EF   2005 Echo   25 %   2005 LHC  No obstructive CAD  10/15 Echo  50-55%   2018 Echo  50%   11/19 Echo  25-30%   11/19 LHC  No obstructive CAD   She was seen by Dr. Saundra Shelling 10/19 with symptoms of worsening heart failure.  Echocardiogram demonstrated EF as above with subsequent catheterization.  There is been interval 30 pound weight gain since the summer.  She has orthopnea nocturnal dyspnea dyspnea on exertion.  She does not have peripheral edema.  Review of her last interrogation in [place] demonstrated a high RV threshold and a reasonable LV threshold.   Past Medical History:  Diagnosis Date  . AICD (automatic cardioverter/defibrillator) present    high RV threshold chronically, device was turned off in 2014; Device battery has been dead  x 7 years  . Anemia, iron deficiency   . Angioedema    felt to likely be due to ace inhibitors but says she has had this even off of medicines, appears to be tolerating ARBs chronically  . Anxiety   . Arthritis    "hands, legs, arms" (07/13/2017)  . Asthmatic bronchitis with status asthmaticus   . Bradycardia   . CHF (congestive heart failure) ([place])   . Chronic lower back pain   . Chronic pain   . Chronic renal insufficiency   . CKD (chronic kidney disease), stage III ([place])   . Coronary artery disease    Mild nonobstructive (30% LAD, 30% RCA) by 09/01/16 cath at [facility]  . Degenerative joint disease   . Depression   . Diabetes mellitus, type 2 ([place])   . Diabetic peripheral neuropathy ([place])   . Dizziness   . Dyspnea on exertion   . Family history of adverse reaction to anesthesia    Mother has nausea  . GERD (gastroesophageal reflux disease)   . Gout   . Heart valve disorder   . History of blood transfusion 1981; ~ 2005; 12/2016   "childbirth; defibrillator OR; colostomy OR"  . History of mononucleosis 03/2014  . Hyperlipidemia   . Hypertension   . Hypothyroid   . Insomnia   . Intervertebral disc degeneration   . Ischemic cardiomyopathy   . LBBB (left bundle branch block)   . Myocardial infarction ([place])  dx'd ~ 2005  . Nonischemic cardiomyopathy ([place])    s/p ICD in 2005. EF has since recovered  . Obesity   . On home oxygen therapy    "2L at night" (07/13/2017)  . OSA on CPAP   . Pneumonia    "couple times; last time was 12/2016" (07/13/2017)  . PONV (postoperative nausea and vomiting)   . Swelling   . Syncope   . Systemic hypertension   . Vitamin D deficiency       Surgical History:  Past Surgical History:  Procedure Laterality Date  . APPENDECTOMY  07/13/2017  . BLADDER SUSPENSION  1990   "w/hysterectomy"  . CARDIAC CATHETERIZATION  2005   no obstructive CAD per patient  . CARDIAC CATHETERIZATION  2018  . CARDIAC DEFIBRILLATOR PLACEMENT  2005   BiV  ICD implanted,  LV lead is an epicardial lead  . CARPAL TUNNEL RELEASE Left 07/25/2014   Dr.Williamson   . CATARACT EXTRACTION W/ INTRAOCULAR LENS  IMPLANT, BILATERAL Bilateral   . COLONIC STENT PLACEMENT N/A 08/31/2017   Procedure: COLONIC STENT PLACEMENT;  Surgeon: Carol Ada, MD;  Location: [place];  Service: Endoscopy;  Laterality: N/A;  . COLONOSCOPY     2010-2011 Dr.Kipreos   . COLOSTOMY  12/2016   Archie Endo 01/26/2017  . COLOSTOMY REVERSAL  07/13/2017  . COLOSTOMY REVERSAL N/A 07/13/2017   Procedure: COLOSTOMY REVERSAL;  Surgeon: Judeth Horn, MD;  Location: [place];  Service: General;  Laterality: N/A;  . CYST REMOVAL HAND Right 05/2013   thumb  . DILATION AND CURETTAGE OF UTERUS  X 5-6  . FLEXIBLE SIGMOIDOSCOPY N/A 08/24/2017   Procedure: FLEXIBLE SIGMOIDOSCOPY;  Surgeon: Carol Ada, MD;  Location: [place];  Service: Endoscopy;  Laterality: N/A;  . FLEXIBLE SIGMOIDOSCOPY N/A 08/31/2017   Procedure: FLEXIBLE SIGMOIDOSCOPY;  Surgeon: Carol Ada, MD;  Location: [place];  Service: Endoscopy;  Laterality: N/A;  stent placement  . IMPLANTABLE CARDIOVERTER DEFIBRILLATOR GENERATOR CHANGE  2008  . LAPAROTOMY N/A 09/18/2017   Procedure: EXPLORATORY LAPAROTOMY;  Surgeon: Judeth Horn, MD;  Location: [place];  Service: General;  Laterality: N/A;  . LYSIS OF ADHESION N/A 09/18/2017   Procedure: LYSIS OF ADHESION;  Surgeon: Judeth Horn, MD;  Location: [place];  Service: General;  Laterality: N/A;  . PARTIAL COLECTOMY N/A 09/18/2017   Procedure: ILEOCOLECTOMY;  Surgeon: Judeth Horn, MD;  Location: [facility];  Service: General;  Laterality: N/A;  . PARTIAL COLECTOMY  09/25/2017  . RIGHT/LEFT HEART CATH AND CORONARY ANGIOGRAPHY N/A 06/01/2018   Procedure: RIGHT/LEFT HEART CATH AND CORONARY ANGIOGRAPHY;  Surgeon: Jettie Booze, MD;  Location: [place] [facility];  Service: Cardiovascular;  Laterality: N/A;  . SIGMOIDOSCOPY N/A 09/18/2017   Procedure: SIGMOIDOSCOPY;  Surgeon: Judeth Horn, MD;  Location: [place];  Service: General;  Laterality: N/A;  . TOTAL ABDOMINAL HYSTERECTOMY  1990  . TRIGGER FINGER RELEASE Right 05/213  . TUBAL LIGATION  1980s  . VESICOVAGINAL FISTULA CLOSURE W/ TAH       Home Meds: Current Meds  Medication Sig  . acetaminophen (TYLENOL) 500 MG tablet Take 1,000 mg by mouth every 6 (six) hours as needed for moderate pain or headache.  . albuterol (PROVENTIL HFA;VENTOLIN HFA) 108 (90 Base) MCG/ACT inhaler Inhale 1-2 puffs into the lungs every 6 (six) hours as needed for wheezing or shortness of breath.  . allopurinol (ZYLOPRIM) 100 MG tablet TAKE 1 TABLET(100 MG) BY MOUTH DAILY  . Calcium Carbonate-Vitamin D (CALTRATE 600+D) 600-400 MG-UNIT tablet Take 1  tablet by mouth 2 (two) times daily.  . carvedilol (COREG) 12.5 MG tablet Take 12.5 mg by mouth 2 (two) times daily with a meal.  . CRESTOR 10 MG tablet Take 1 tablet (10 mg total) by mouth at bedtime.  . diclofenac sodium (VOLTAREN) 1 % GEL Apply 2 g topically 4 (four) times daily as needed (for pain.).   Marland Kitchen DULoxetine (CYMBALTA) 30 MG capsule TAKE ONE CAPSULE BY MOUTH DAILY  . gabapentin (NEURONTIN) 100 MG capsule TAKE 1 CAPSULE BY MOUTH EVERY MORNING  . gabapentin (NEURONTIN) 800 MG tablet TAKE 1 TABLET BY MOUTH DAILY AT BEDTIME  . Insulin Glargine (TOUJEO MAX SOLOSTAR) 300 UNIT/ML SOPN Inject 110 Units into the skin every evening.   . Insulin Glulisine (APIDRA) 100 UNIT/ML Solostar Pen Inject 25 Units into the skin 3 (three) times daily.  Marland Kitchen levothyroxine (SYNTHROID, LEVOTHROID) 88 MCG tablet Take 88 mcg by mouth daily before breakfast.   . loratadine (CLARITIN) 10 MG tablet Take 10 mg by mouth daily.  Marland Kitchen losartan (COZAAR) 25 MG tablet TAKE 1 TABLET(25 MG) BY MOUTH DAILY  . montelukast (SINGULAIR) 10 MG tablet Take 10 mg by mouth at bedtime.  Marland Kitchen omega-3 acid ethyl esters (LOVAZA) 1 g capsule Take 4 g by mouth at bedtime.   . OXYGEN Inhale 2 L into the lungs.  . pantoprazole (PROTONIX) 40 MG  tablet Take 40 mg by mouth every evening.  . temazepam (RESTORIL) 15 MG capsule Take 1 capsule (15 mg total) by mouth at bedtime as needed for sleep.    Allergies:  Allergies  Allergen Reactions  . Glucophage [Metformin Hcl] Other (See Comments)    Renal failure  . Ace Inhibitors Swelling and Other (See Comments)    Angioedema  . Advicor [Niacin-Lovastatin Er] Other (See Comments)    Muscle aches  . Bystolic [Nebivolol Hcl] Other (See Comments)    Edema  . Erythromycin Diarrhea, Nausea And Vomiting and Other (See Comments)    *DERIVATIVES*  . Lipitor [Atorvastatin] Other (See Comments)    Muscle aches  . Lopid [Gemfibrozil] Other (See Comments)    Muscle aches  . Statins Other (See Comments)    Muscle aches  . Telmisartan     AVOID ARB/ ACEi in this patient due to recurrent AKI  . Adhesive [Tape] Rash    Social History   Socioeconomic History  . Marital status: Widowed    Spouse name: Not on file  . Number of children: Not on file  . Years of education: Not on file  . Highest education level: Not on file  Occupational History    Comment: retired Corporate treasurer  Social Needs  . Financial resource strain: Not hard at all  . Food insecurity:    Worry: Never true    Inability: Never true  . Transportation needs:    Medical: No    Non-medical: No  Tobacco Use  . Smoking status: Never Smoker  . Smokeless tobacco: Never Used  Substance and Sexual Activity  . Alcohol use: No  . Drug use: No  . Sexual activity: Never  Lifestyle  . Physical activity:    Days per week: 0 days    Minutes per session: 0 min  . Stress: To some extent  Relationships  . Social connections:    Talks on phone: More than three times a week    Gets together: More than three times a week    Attends religious service: More than 4 times per year  Active member of club or organization: No    Attends meetings of clubs or organizations: Never    Relationship status: Widowed  . Intimate partner  violence:    Fear of current or ex partner: No    Emotionally abused: No    Physically abused: No    Forced sexual activity: No  Other Topics Concern  . Not on file  Social History Narrative   Pt lives in [place] alone.  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      As of 03/15/17:   Diet: 1800 Calorie      Caffeine: Yes      Married, if yes what year: Widowed, married 1973      Do you live in a house, apartment, assisted living, condo, trailer, ect: House, 1 stories, and 1 person      Pets: No      Current/Past profession: LPN, retired       Exercise: Yes, walking          Living Will: Yes   DNR: No   POA/HPOA: Yes      Functional Status:   Do you have difficulty bathing or dressing yourself? No   Do you have difficulty preparing food or eating? No   Do you have difficulty managing your medications? No   Do you have difficulty managing your finances? No   Do you have difficulty affording your medications? Yes     Family History  Problem Relation Age of Onset  . Hypertension Father   . Heart disease Father   . Cancer Father        prostate  . Heart disease Other        (Maternal side) Ischemic heart disease  . Diabetes Mellitus II Other   . Arrhythmia Mother   . Diabetes Mellitus II Mother        Borderline DM  . Hypertension Mother   . Asthma Mother   . Cancer Brother   . Dementia Brother   . Hypertension Brother   . Hypertension Daughter   . Hypertension Daughter   . Diabetes Daughter   . Hypertension Daughter   . Atrial fibrillation Daughter   . GER disease Daughter   . Hypertension Son   . Anxiety disorder Son   . Hypothyroidism Son   . Breast cancer Maternal Grandmother      ROS:  Please see the history of present illness.     All other systems reviewed and negative.    Physical Exam:  Blood pressure 122/80, pulse 77, height 5\' 6"  (1.676 m), weight 227 lb 3.2 oz (103.1 kg), SpO2 92 %. General:  Well developed,Morbidly obese female in no acute distress. Head: Normocephalic, atraumatic, sclera non-icteric, no xanthomas, nares are without discharge. EENT: normal  Lymph Nodes:  none Neck: Negative for carotid bruits. JVD>10. Back:without scoliosis kyphosis Lungs: Clear bilaterally to auscultation without wheezes, rales, or rhonchi. Breathing is unlabored. Heart: RRR with S1 S2. No murmur . No rubs, or gallops appreciated. Abdomen: Soft, non-tender distended with normoactive bowel sounds. No hepatomegaly. No rebound/guarding. No obvious abdominal masses. Msk:  Strength and tone appear normal for age. Extremities: No clubbing or cyanosis. No edema.  Distal pedal pulses are 2+ and equal bilaterally. Skin: Warm and Dry Neuro: Alert and oriented X 3. CN III-XII intact Grossly normal sensory and motor function . Psych:  Responds to questions appropriately with a normal affect.  Labs: Cardiac Enzymes No results for input(s): CKTOTAL, CKMB, TROPONINI in the last 72 hours. CBC Lab Results  Component Value Date   WBC 12.2 (H) 05/31/2018   HGB 13.9 06/01/2018   HCT 41.0 06/01/2018   MCV 81 05/31/2018   PLT 243 05/31/2018   PROTIME: No results for input(s): LABPROT, INR in the last 72 hours. Chemistry No results for input(s): NA, K, CL, CO2, BUN, CREATININE, CALCIUM, PROT, BILITOT, ALKPHOS, ALT, AST, GLUCOSE in the last 168 hours.  Invalid input(s): LABALBU Lipids Lab Results  Component Value Date   CHOL 186 11/22/2017   HDL 48 (L) 11/22/2017   LDLCALC 98 11/22/2017   TRIG 279 (H) 11/22/2017   BNP NT-Pro BNP  Date/Time Value Ref Range Status  05/23/2018 02:40 PM 948 (H) 0 - 301 pg/mL Final    Comment:    The following cut-points have been suggested for the use of proBNP for the diagnostic evaluation of heart failure (HF) in patients with acute dyspnea: Modality                     Age           Optimal Cut                            (years)             Point ------------------------------------------------------ Diagnosis (rule in HF)        <50            450 pg/mL                           50 - 75            900 pg/mL                               >75           1800 pg/mL Exclusion (rule out HF)  Age independent     300 pg/mL    Thyroid Function Tests: No results for input(s): TSH, T4TOTAL, T3FREE, THYROIDAB in the last 72 hours.  Invalid input(s): FREET3 Miscellaneous No results found for: DDIMER  Radiology/Studies:  No results found.  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  She is volume overloaded.  Needs diuresis.  With her abdominal distention, I suspect that she has significant gut edema and so he is torsemide.  We will start her on 40 mg a day.  She is to let us know if she does not diurese.  We will change her from losartan--Entresto.  We will check a metabolic profile in 2 weeks.  Last labs normal potassium  The timing of generator upgrade is probably not related to her draining fistula; have reviewed with Dr. Greggory Brandy who agrees.  Hence, will undertake diuresis and then undertake generator replacement.  Her RV threshold is high; if the vein is patent we will replace it with a rate sense lead.  If not would pirate the LV lead is sensing  is adequate into the RV pacing port and new LV pacing only.     Virl Axe

## 2018-06-11 NOTE — H&P (View-Only) (Signed)
ELECTROPHYSIOLOGY CONSULT NOTE  Patient ID: Crystal [name], MRN: [id number], DOB/AGE: 29-Oct-1950 67 y.o. Admit date: (Not on file) Date of Consult: 06/11/2018  Primary Physician: Gildardo Cranker, DO Primary Cardiologist: Johney Frame is a 66 y.o. female who is being seen today for the evaluation of device replacement at the request of Dr Saundra Shelling.    HPI Crystal [name] is a 67 y.o. female   Referred for consideration of reimplantation of a biventricular defibrillator.  She has a history of nonischemic myopathy dating back to 2005.  EF at that time was 24% catheterization showed no obstructive coronary disease and she had left bundle branch block.    She underwent biventricular ICD implantation with epicardial lead; the battery depleted and underwent device generator reimplantation about 2009.  About 2015 her second device reached ERI.  With interval normalization the decision was made [facility]) not to reimplant; her QRS duration 10/15 was less than 120 ms  Multiple ECGs were reviewed from 2017--2019.  Even with rates as low as 75 left bundle branch block was present ECG 10/19 demonstrated recurrent left bundle branch block  DATE TEST EF   2005 Echo   25 %   2005 LHC  No obstructive CAD  10/15 Echo  50-55%   2018 Echo  50%   11/19 Echo  25-30%   11/19 LHC  No obstructive CAD   She was seen by Dr. Saundra Shelling 10/19 with symptoms of worsening heart failure.  Echocardiogram demonstrated EF as above with subsequent catheterization.  There is been interval 30 pound weight gain since the summer.  She has orthopnea nocturnal dyspnea dyspnea on exertion.  She does not have peripheral edema.  Review of her last interrogation in [place] demonstrated a high RV threshold and a reasonable LV threshold.   Past Medical History:  Diagnosis Date  . AICD (automatic cardioverter/defibrillator) present    high RV threshold chronically, device was turned off in 2014; Device battery has been dead  x 7 years  . Anemia, iron deficiency   . Angioedema    felt to likely be due to ace inhibitors but says she has had this even off of medicines, appears to be tolerating ARBs chronically  . Anxiety   . Arthritis    "hands, legs, arms" (07/13/2017)  . Asthmatic bronchitis with status asthmaticus   . Bradycardia   . CHF (congestive heart failure) ([place])   . Chronic lower back pain   . Chronic pain   . Chronic renal insufficiency   . CKD (chronic kidney disease), stage III ([place])   . Coronary artery disease    Mild nonobstructive (30% LAD, 30% RCA) by 09/01/16 cath [facility]  . Degenerative joint disease   . Depression   . Diabetes mellitus, type 2 ([place])   . Diabetic peripheral neuropathy ([place])   . Dizziness   . Dyspnea on exertion   . Family history of adverse reaction to anesthesia    Mother has nausea  . GERD (gastroesophageal reflux disease)   . Gout   . Heart valve disorder   . History of blood transfusion 1981; ~ 2005; 12/2016   "childbirth; defibrillator OR; colostomy OR"  . History of mononucleosis 03/2014  . Hyperlipidemia   . Hypertension   . Hypothyroid   . Insomnia   . Intervertebral disc degeneration   . Ischemic cardiomyopathy   . LBBB (left bundle branch block)   . Myocardial infarction ([place])  dx'd ~ 2005  . Nonischemic cardiomyopathy ([place])    s/p ICD in 2005. EF has since recovered  . Obesity   . On home oxygen therapy    "2L at night" (07/13/2017)  . OSA on CPAP   . Pneumonia    "couple times; last time was 12/2016" (07/13/2017)  . PONV (postoperative nausea and vomiting)   . Swelling   . Syncope   . Systemic hypertension   . Vitamin D deficiency       Surgical History:  Past Surgical History:  Procedure Laterality Date  . APPENDECTOMY  07/13/2017  . BLADDER SUSPENSION  1990   "w/hysterectomy"  . CARDIAC CATHETERIZATION  2005   no obstructive CAD per patient  . CARDIAC CATHETERIZATION  2018  . CARDIAC DEFIBRILLATOR PLACEMENT  2005   BiV  ICD implanted,  LV lead is an epicardial lead  . CARPAL TUNNEL RELEASE Left 07/25/2014   Dr.Williamson   . CATARACT EXTRACTION W/ INTRAOCULAR LENS  IMPLANT, BILATERAL Bilateral   . COLONIC STENT PLACEMENT N/A 08/31/2017   Procedure: COLONIC STENT PLACEMENT;  Surgeon: Carol Ada, MD;  Location: [place];  Service: Endoscopy;  Laterality: N/A;  . COLONOSCOPY     2010-2011 Dr.Kipreos   . COLOSTOMY  12/2016   Archie Endo 01/26/2017  . COLOSTOMY REVERSAL  07/13/2017  . COLOSTOMY REVERSAL N/A 07/13/2017   Procedure: COLOSTOMY REVERSAL;  Surgeon: Judeth Horn, MD;  Location: [place];  Service: General;  Laterality: N/A;  . CYST REMOVAL HAND Right 05/2013   thumb  . DILATION AND CURETTAGE OF UTERUS  X 5-6  . FLEXIBLE SIGMOIDOSCOPY N/A 08/24/2017   Procedure: FLEXIBLE SIGMOIDOSCOPY;  Surgeon: Carol Ada, MD;  Location: [place];  Service: Endoscopy;  Laterality: N/A;  . FLEXIBLE SIGMOIDOSCOPY N/A 08/31/2017   Procedure: FLEXIBLE SIGMOIDOSCOPY;  Surgeon: Carol Ada, MD;  Location: [place];  Service: Endoscopy;  Laterality: N/A;  stent placement  . IMPLANTABLE CARDIOVERTER DEFIBRILLATOR GENERATOR CHANGE  2008  . LAPAROTOMY N/A 09/18/2017   Procedure: EXPLORATORY LAPAROTOMY;  Surgeon: Judeth Horn, MD;  Location: [place];  Service: General;  Laterality: N/A;  . LYSIS OF ADHESION N/A 09/18/2017   Procedure: LYSIS OF ADHESION;  Surgeon: Judeth Horn, MD;  Location: [place];  Service: General;  Laterality: N/A;  . PARTIAL COLECTOMY N/A 09/18/2017   Procedure: ILEOCOLECTOMY;  Surgeon: Judeth Horn, MD;  Location: [place];  Service: General;  Laterality: N/A;  . PARTIAL COLECTOMY  09/25/2017  . RIGHT/LEFT HEART CATH AND CORONARY ANGIOGRAPHY N/A 06/01/2018   Procedure: RIGHT/LEFT HEART CATH AND CORONARY ANGIOGRAPHY;  Surgeon: Jettie Booze, MD;  Location: [place] [facility];  Service: Cardiovascular;  Laterality: N/A;  . SIGMOIDOSCOPY N/A 09/18/2017   Procedure: SIGMOIDOSCOPY;  Surgeon: Judeth Horn, MD;  Location: [place];  Service: General;  Laterality: N/A;  . TOTAL ABDOMINAL HYSTERECTOMY  1990  . TRIGGER FINGER RELEASE Right 05/213  . TUBAL LIGATION  1980s  . VESICOVAGINAL FISTULA CLOSURE W/ TAH       Home Meds: Current Meds  Medication Sig  . acetaminophen (TYLENOL) 500 MG tablet Take 1,000 mg by mouth every 6 (six) hours as needed for moderate pain or headache.  . albuterol (PROVENTIL HFA;VENTOLIN HFA) 108 (90 Base) MCG/ACT inhaler Inhale 1-2 puffs into the lungs every 6 (six) hours as needed for wheezing or shortness of breath.  . allopurinol (ZYLOPRIM) 100 MG tablet TAKE 1 TABLET(100 MG) BY MOUTH DAILY  . Calcium Carbonate-Vitamin D (CALTRATE 600+D) 600-400 MG-UNIT tablet Take 1  tablet by mouth 2 (two) times daily.  . carvedilol (COREG) 12.5 MG tablet Take 12.5 mg by mouth 2 (two) times daily with a meal.  . CRESTOR 10 MG tablet Take 1 tablet (10 mg total) by mouth at bedtime.  . diclofenac sodium (VOLTAREN) 1 % GEL Apply 2 g topically 4 (four) times daily as needed (for pain.).   Marland Kitchen DULoxetine (CYMBALTA) 30 MG capsule TAKE ONE CAPSULE BY MOUTH DAILY  . gabapentin (NEURONTIN) 100 MG capsule TAKE 1 CAPSULE BY MOUTH EVERY MORNING  . gabapentin (NEURONTIN) 800 MG tablet TAKE 1 TABLET BY MOUTH DAILY AT BEDTIME  . Insulin Glargine (TOUJEO MAX SOLOSTAR) 300 UNIT/ML SOPN Inject 110 Units into the skin every evening.   . Insulin Glulisine (APIDRA) 100 UNIT/ML Solostar Pen Inject 25 Units into the skin 3 (three) times daily.  Marland Kitchen levothyroxine (SYNTHROID, LEVOTHROID) 88 MCG tablet Take 88 mcg by mouth daily before breakfast.   . loratadine (CLARITIN) 10 MG tablet Take 10 mg by mouth daily.  Marland Kitchen losartan (COZAAR) 25 MG tablet TAKE 1 TABLET(25 MG) BY MOUTH DAILY  . montelukast (SINGULAIR) 10 MG tablet Take 10 mg by mouth at bedtime.  Marland Kitchen omega-3 acid ethyl esters (LOVAZA) 1 g capsule Take 4 g by mouth at bedtime.   . OXYGEN Inhale 2 L into the lungs.  . pantoprazole (PROTONIX) 40 MG  tablet Take 40 mg by mouth every evening.  . temazepam (RESTORIL) 15 MG capsule Take 1 capsule (15 mg total) by mouth at bedtime as needed for sleep.    Allergies:  Allergies  Allergen Reactions  . Glucophage [Metformin Hcl] Other (See Comments)    Renal failure  . Ace Inhibitors Swelling and Other (See Comments)    Angioedema  . Advicor [Niacin-Lovastatin Er] Other (See Comments)    Muscle aches  . Bystolic [Nebivolol Hcl] Other (See Comments)    Edema  . Erythromycin Diarrhea, Nausea And Vomiting and Other (See Comments)    *DERIVATIVES*  . Lipitor [Atorvastatin] Other (See Comments)    Muscle aches  . Lopid [Gemfibrozil] Other (See Comments)    Muscle aches  . Statins Other (See Comments)    Muscle aches  . Telmisartan     AVOID ARB/ ACEi in this patient due to recurrent AKI  . Adhesive [Tape] Rash    Social History   Socioeconomic History  . Marital status: Widowed    Spouse name: Not on file  . Number of children: Not on file  . Years of education: Not on file  . Highest education level: Not on file  Occupational History    Comment: retired Corporate treasurer  Social Needs  . Financial resource strain: Not hard at all  . Food insecurity:    Worry: Never true    Inability: Never true  . Transportation needs:    Medical: No    Non-medical: No  Tobacco Use  . Smoking status: Never Smoker  . Smokeless tobacco: Never Used  Substance and Sexual Activity  . Alcohol use: No  . Drug use: No  . Sexual activity: Never  Lifestyle  . Physical activity:    Days per week: 0 days    Minutes per session: 0 min  . Stress: To some extent  Relationships  . Social connections:    Talks on phone: More than three times a week    Gets together: More than three times a week    Attends religious service: More than 4 times per year  Active member of club or organization: No    Attends meetings of clubs or organizations: Never    Relationship status: Widowed  . Intimate partner  violence:    Fear of current or ex partner: No    Emotionally abused: No    Physically abused: No    Forced sexual activity: No  Other Topics Concern  . Not on file  Social History Narrative   Pt lives in [place] alone.  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      As of 03/15/17:   Diet: 1800 Calorie      Caffeine: Yes      Married, if yes what year: Widowed, married 1973      Do you live in a house, apartment, assisted living, condo, trailer, ect: House, 1 stories, and 1 person      Pets: No      Current/Past profession: LPN, retired       Exercise: Yes, walking          Living Will: Yes   DNR: No   POA/HPOA: Yes      Functional Status:   Do you have difficulty bathing or dressing yourself? No   Do you have difficulty preparing food or eating? No   Do you have difficulty managing your medications? No   Do you have difficulty managing your finances? No   Do you have difficulty affording your medications? Yes     Family History  Problem Relation Age of Onset  . Hypertension Father   . Heart disease Father   . Cancer Father        prostate  . Heart disease Other        (Maternal side) Ischemic heart disease  . Diabetes Mellitus II Other   . Arrhythmia Mother   . Diabetes Mellitus II Mother        Borderline DM  . Hypertension Mother   . Asthma Mother   . Cancer Brother   . Dementia Brother   . Hypertension Brother   . Hypertension Daughter   . Hypertension Daughter   . Diabetes Daughter   . Hypertension Daughter   . Atrial fibrillation Daughter   . GER disease Daughter   . Hypertension Son   . Anxiety disorder Son   . Hypothyroidism Son   . Breast cancer Maternal Grandmother      ROS:  Please see the history of present illness.     All other systems reviewed and negative.    Physical Exam:  Blood pressure 122/80, pulse 77, height 5\' 6"  (1.676 m), weight 227 lb 3.2 oz (103.1 kg), SpO2 92 %. General:  Well developed,Morbidly obese female in no acute distress. Head: Normocephalic, atraumatic, sclera non-icteric, no xanthomas, nares are without discharge. EENT: normal  Lymph Nodes:  none Neck: Negative for carotid bruits. JVD>10. Back:without scoliosis kyphosis Lungs: Clear bilaterally to auscultation without wheezes, rales, or rhonchi. Breathing is unlabored. Heart: RRR with S1 S2. No murmur . No rubs, or gallops appreciated. Abdomen: Soft, non-tender distended with normoactive bowel sounds. No hepatomegaly. No rebound/guarding. No obvious abdominal masses. Msk:  Strength and tone appear normal for age. Extremities: No clubbing or cyanosis. No edema.  Distal pedal pulses are 2+ and equal bilaterally. Skin: Warm and Dry Neuro: Alert and oriented X 3. CN III-XII intact Grossly normal sensory and motor function . Psych:  Responds to questions appropriately with a normal affect.  Labs: Cardiac Enzymes No results for input(s): CKTOTAL, CKMB, TROPONINI in the last 72 hours. CBC Lab Results  Component Value Date   WBC 12.2 (H) 05/31/2018   HGB 13.9 06/01/2018   HCT 41.0 06/01/2018   MCV 81 05/31/2018   PLT 243 05/31/2018   PROTIME: No results for input(s): LABPROT, INR in the last 72 hours. Chemistry No results for input(s): NA, K, CL, CO2, BUN, CREATININE, CALCIUM, PROT, BILITOT, ALKPHOS, ALT, AST, GLUCOSE in the last 168 hours.  Invalid input(s): LABALBU Lipids Lab Results  Component Value Date   CHOL 186 11/22/2017   HDL 48 (L) 11/22/2017   LDLCALC 98 11/22/2017   TRIG 279 (H) 11/22/2017   BNP NT-Pro BNP  Date/Time Value Ref Range Status  05/23/2018 02:40 PM 948 (H) 0 - 301 pg/mL Final    Comment:    The following cut-points have been suggested for the use of proBNP for the diagnostic evaluation of heart failure (HF) in patients with acute dyspnea: Modality                     Age           Optimal Cut                            (years)             Point ------------------------------------------------------ Diagnosis (rule in HF)        <50            450 pg/mL                           50 - 75            900 pg/mL                               >75           1800 pg/mL Exclusion (rule out HF)  Age independent     300 pg/mL    Thyroid Function Tests: No results for input(s): TSH, T4TOTAL, T3FREE, THYROIDAB in the last 72 hours.  Invalid input(s): FREET3 Miscellaneous No results found for: DDIMER  Radiology/Studies:  No results found.  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  She is volume overloaded.  Needs diuresis.  With her abdominal distention, I suspect that she has significant gut edema and so he is torsemide.  We will start her on 40 mg a day.  She is to let us know if she does not diurese.  We will change her from losartan--Entresto.  We will check a metabolic profile in 2 weeks.  Last labs normal potassium  The timing of generator upgrade is probably not related to her draining fistula; have reviewed with Dr. Greggory Brandy who agrees.  Hence, will undertake diuresis and then undertake generator replacement.  Her RV threshold is high; if the vein is patent we will replace it with a rate sense lead.  If not would pirate the LV lead is sensing  is adequate into the RV pacing port and new LV pacing only.     Virl Axe

## 2018-06-11 NOTE — Patient Instructions (Addendum)
Your physician has recommended you make the following change in your medication:  STOP LOSARTAN  START ENTRESTO 24/26 MG  START TORSEMIDE 40 MG FOR 3 DAYS THEN DECREASE TO 20 MG  KCL (POTASSIUM ) 20 [place]   Your physician recommends that you return for lab work in: [place]  BMET IN 2 [place] physician recommends that you schedule a follow-up appointment in: 7-10 DAYS AFTER 11-29 -[place]

## 2018-06-22 ENCOUNTER — Other Ambulatory Visit: Payer: Self-pay

## 2018-06-22 ENCOUNTER — Encounter (HOSPITAL_COMMUNITY)
Admission: RE | Disposition: A | Discharge status: Home / Self Care | Payer: Self-pay | Source: Ambulatory Visit | Attending: Internal Medicine

## 2018-06-22 ENCOUNTER — Ambulatory Visit (HOSPITAL_COMMUNITY)
Admission: RE | Admit: 2018-06-22 | Discharge: 2018-06-23 | Disposition: A | Discharge status: Home / Self Care | Payer: Medicare Other | Source: Ambulatory Visit | Attending: Internal Medicine | Admitting: Internal Medicine

## 2018-06-22 ENCOUNTER — Encounter (HOSPITAL_COMMUNITY): Payer: Self-pay | Admitting: General Practice

## 2018-06-22 DIAGNOSIS — I428 Other cardiomyopathies: Secondary | ICD-10-CM | POA: Diagnosis not present

## 2018-06-22 DIAGNOSIS — I2510 Atherosclerotic heart disease of native coronary artery without angina pectoris: Secondary | ICD-10-CM | POA: Insufficient documentation

## 2018-06-22 DIAGNOSIS — E1122 Type 2 diabetes mellitus with diabetic chronic kidney disease: Secondary | ICD-10-CM | POA: Diagnosis not present

## 2018-06-22 DIAGNOSIS — M1990 Unspecified osteoarthritis, unspecified site: Secondary | ICD-10-CM | POA: Insufficient documentation

## 2018-06-22 DIAGNOSIS — I5023 Acute on chronic systolic (congestive) heart failure: Secondary | ICD-10-CM | POA: Insufficient documentation

## 2018-06-22 DIAGNOSIS — I252 Old myocardial infarction: Secondary | ICD-10-CM | POA: Insufficient documentation

## 2018-06-22 DIAGNOSIS — Z888 Allergy status to other drugs, medicaments and biological substances status: Secondary | ICD-10-CM | POA: Insufficient documentation

## 2018-06-22 DIAGNOSIS — Z8249 Family history of ischemic heart disease and other diseases of the circulatory system: Secondary | ICD-10-CM | POA: Insufficient documentation

## 2018-06-22 DIAGNOSIS — N183 Chronic kidney disease, stage 3 (moderate): Secondary | ICD-10-CM | POA: Insufficient documentation

## 2018-06-22 DIAGNOSIS — K219 Gastro-esophageal reflux disease without esophagitis: Secondary | ICD-10-CM | POA: Insufficient documentation

## 2018-06-22 DIAGNOSIS — Z9889 Other specified postprocedural states: Secondary | ICD-10-CM | POA: Insufficient documentation

## 2018-06-22 DIAGNOSIS — I255 Ischemic cardiomyopathy: Secondary | ICD-10-CM | POA: Insufficient documentation

## 2018-06-22 DIAGNOSIS — Z9842 Cataract extraction status, left eye: Secondary | ICD-10-CM | POA: Insufficient documentation

## 2018-06-22 DIAGNOSIS — Z9981 Dependence on supplemental oxygen: Secondary | ICD-10-CM | POA: Insufficient documentation

## 2018-06-22 DIAGNOSIS — G4733 Obstructive sleep apnea (adult) (pediatric): Secondary | ICD-10-CM | POA: Insufficient documentation

## 2018-06-22 DIAGNOSIS — G4700 Insomnia, unspecified: Secondary | ICD-10-CM | POA: Insufficient documentation

## 2018-06-22 DIAGNOSIS — R0989 Other specified symptoms and signs involving the circulatory and respiratory systems: Secondary | ICD-10-CM | POA: Diagnosis not present

## 2018-06-22 DIAGNOSIS — Z79890 Hormone replacement therapy (postmenopausal): Secondary | ICD-10-CM | POA: Insufficient documentation

## 2018-06-22 DIAGNOSIS — E1142 Type 2 diabetes mellitus with diabetic polyneuropathy: Secondary | ICD-10-CM | POA: Diagnosis not present

## 2018-06-22 DIAGNOSIS — I447 Left bundle-branch block, unspecified: Secondary | ICD-10-CM | POA: Insufficient documentation

## 2018-06-22 DIAGNOSIS — I10 Essential (primary) hypertension: Secondary | ICD-10-CM | POA: Diagnosis present

## 2018-06-22 DIAGNOSIS — I509 Heart failure, unspecified: Secondary | ICD-10-CM

## 2018-06-22 DIAGNOSIS — M109 Gout, unspecified: Secondary | ICD-10-CM | POA: Diagnosis not present

## 2018-06-22 DIAGNOSIS — J45909 Unspecified asthma, uncomplicated: Secondary | ICD-10-CM | POA: Diagnosis not present

## 2018-06-22 DIAGNOSIS — E785 Hyperlipidemia, unspecified: Secondary | ICD-10-CM | POA: Diagnosis not present

## 2018-06-22 DIAGNOSIS — Z933 Colostomy status: Secondary | ICD-10-CM | POA: Insufficient documentation

## 2018-06-22 DIAGNOSIS — I130 Hypertensive heart and chronic kidney disease with heart failure and stage 1 through stage 4 chronic kidney disease, or unspecified chronic kidney disease: Secondary | ICD-10-CM | POA: Diagnosis not present

## 2018-06-22 DIAGNOSIS — Z9049 Acquired absence of other specified parts of digestive tract: Secondary | ICD-10-CM | POA: Insufficient documentation

## 2018-06-22 DIAGNOSIS — Z959 Presence of cardiac and vascular implant and graft, unspecified: Secondary | ICD-10-CM

## 2018-06-22 DIAGNOSIS — Z818 Family history of other mental and behavioral disorders: Secondary | ICD-10-CM | POA: Insufficient documentation

## 2018-06-22 DIAGNOSIS — Z006 Encounter for examination for normal comparison and control in clinical research program: Secondary | ICD-10-CM | POA: Diagnosis not present

## 2018-06-22 DIAGNOSIS — Z794 Long term (current) use of insulin: Secondary | ICD-10-CM | POA: Insufficient documentation

## 2018-06-22 DIAGNOSIS — E559 Vitamin D deficiency, unspecified: Secondary | ICD-10-CM | POA: Insufficient documentation

## 2018-06-22 DIAGNOSIS — Z9841 Cataract extraction status, right eye: Secondary | ICD-10-CM | POA: Insufficient documentation

## 2018-06-22 DIAGNOSIS — Z6835 Body mass index (BMI) 35.0-35.9, adult: Secondary | ICD-10-CM | POA: Insufficient documentation

## 2018-06-22 DIAGNOSIS — F419 Anxiety disorder, unspecified: Secondary | ICD-10-CM | POA: Diagnosis not present

## 2018-06-22 DIAGNOSIS — Z955 Presence of coronary angioplasty implant and graft: Secondary | ICD-10-CM | POA: Insufficient documentation

## 2018-06-22 DIAGNOSIS — Z90710 Acquired absence of both cervix and uterus: Secondary | ICD-10-CM | POA: Insufficient documentation

## 2018-06-22 DIAGNOSIS — Z79899 Other long term (current) drug therapy: Secondary | ICD-10-CM | POA: Insufficient documentation

## 2018-06-22 DIAGNOSIS — N189 Chronic kidney disease, unspecified: Secondary | ICD-10-CM | POA: Diagnosis present

## 2018-06-22 DIAGNOSIS — F329 Major depressive disorder, single episode, unspecified: Secondary | ICD-10-CM | POA: Diagnosis not present

## 2018-06-22 DIAGNOSIS — Z8349 Family history of other endocrine, nutritional and metabolic diseases: Secondary | ICD-10-CM | POA: Insufficient documentation

## 2018-06-22 DIAGNOSIS — I519 Heart disease, unspecified: Secondary | ICD-10-CM | POA: Diagnosis present

## 2018-06-22 DIAGNOSIS — E039 Hypothyroidism, unspecified: Secondary | ICD-10-CM | POA: Insufficient documentation

## 2018-06-22 DIAGNOSIS — E119 Type 2 diabetes mellitus without complications: Secondary | ICD-10-CM

## 2018-06-22 DIAGNOSIS — Z881 Allergy status to other antibiotic agents status: Secondary | ICD-10-CM | POA: Insufficient documentation

## 2018-06-22 DIAGNOSIS — Z833 Family history of diabetes mellitus: Secondary | ICD-10-CM | POA: Insufficient documentation

## 2018-06-22 HISTORY — PX: LEAD REVISION: SHX5945

## 2018-06-22 HISTORY — PX: LEAD REVISION/REPAIR: EP1213

## 2018-06-22 LAB — SURGICAL PCR SCREEN
MRSA, PCR: NEGATIVE
Staphylococcus aureus: POSITIVE — AB

## 2018-06-22 LAB — GLUCOSE, CAPILLARY
Glucose-Capillary: 168 mg/dL — ABNORMAL HIGH (ref 70–99)
Glucose-Capillary: 284 mg/dL — ABNORMAL HIGH (ref 70–99)

## 2018-06-22 SURGERY — LEAD REVISION/REPAIR

## 2018-06-22 MED ORDER — MIDAZOLAM HCL 5 MG/5ML IJ SOLN
INTRAMUSCULAR | Status: DC | PRN
  Administered 2018-06-22: 1 mg via INTRAVENOUS
  Administered 2018-06-22: 2 mg via INTRAVENOUS
  Administered 2018-06-22 (×4): 1 mg via INTRAVENOUS

## 2018-06-22 MED ORDER — MIDAZOLAM HCL 5 MG/5ML IJ SOLN
INTRAMUSCULAR | Status: AC
  Filled 2018-06-22: qty 5

## 2018-06-22 MED ORDER — IOPAMIDOL (ISOVUE-370) INJECTION 76%
INTRAVENOUS | Status: AC
  Filled 2018-06-22: qty 50

## 2018-06-22 MED ORDER — FENTANYL CITRATE (PF) 100 MCG/2ML IJ SOLN
INTRAMUSCULAR | Status: AC
  Filled 2018-06-22: qty 2

## 2018-06-22 MED ORDER — INSULIN GLARGINE 100 UNIT/ML ~~LOC~~ SOLN
110.0000 [IU] | Freq: Every day | SUBCUTANEOUS | Status: DC
  Administered 2018-06-22: 110 [IU] via SUBCUTANEOUS
  Filled 2018-06-22: qty 1.1

## 2018-06-22 MED ORDER — YOU HAVE A PACEMAKER BOOK
Freq: Once | Status: AC
  Administered 2018-06-22: 1
  Filled 2018-06-22: qty 1

## 2018-06-22 MED ORDER — SODIUM CHLORIDE 0.9 % IV SOLN
INTRAVENOUS | Status: AC | PRN
  Administered 2018-06-22: 50 mL/h via INTRAVENOUS
  Administered 2018-06-22: 10 mL/h via INTRAVENOUS
  Administered 2018-06-22: 50 mL/h via INTRAVENOUS

## 2018-06-22 MED ORDER — LIDOCAINE HCL (PF) 1 % IJ SOLN
INTRAMUSCULAR | Status: AC
  Filled 2018-06-22: qty 30

## 2018-06-22 MED ORDER — CHLORHEXIDINE GLUCONATE CLOTH 2 % EX PADS
6.0000 | MEDICATED_PAD | Freq: Every day | CUTANEOUS | Status: DC
  Administered 2018-06-22: 6 via TOPICAL

## 2018-06-22 MED ORDER — HEPARIN (PORCINE) IN NACL 1000-0.9 UT/500ML-% IV SOLN
INTRAVENOUS | Status: AC
  Filled 2018-06-22: qty 500

## 2018-06-22 MED ORDER — SODIUM CHLORIDE 0.9 % IV SOLN
INTRAVENOUS | Status: DC
  Administered 2018-06-22: 09:00:00 via INTRAVENOUS

## 2018-06-22 MED ORDER — ACETAMINOPHEN 325 MG PO TABS
325.0000 mg | ORAL_TABLET | ORAL | Status: DC | PRN
  Administered 2018-06-22: 21:00:00 650 mg via ORAL
  Filled 2018-06-22: qty 2

## 2018-06-22 MED ORDER — CEFAZOLIN SODIUM-DEXTROSE 2-4 GM/100ML-% IV SOLN
2.0000 g | INTRAVENOUS | Status: AC
  Administered 2018-06-22: 2 g via INTRAVENOUS

## 2018-06-22 MED ORDER — SODIUM CHLORIDE 0.9 % IV SOLN: INTRAVENOUS | Status: AC

## 2018-06-22 MED ORDER — FENTANYL CITRATE (PF) 100 MCG/2ML IJ SOLN
INTRAMUSCULAR | Status: DC | PRN
  Administered 2018-06-22 (×6): 25 ug via INTRAVENOUS

## 2018-06-22 MED ORDER — INSULIN ASPART 100 UNIT/ML ~~LOC~~ SOLN
0.0000 [IU] | Freq: Three times a day (TID) | SUBCUTANEOUS | Status: DC
  Administered 2018-06-23: 07:00:00 2 [IU] via SUBCUTANEOUS

## 2018-06-22 MED ORDER — MUPIROCIN 2 % EX OINT
TOPICAL_OINTMENT | CUTANEOUS | Status: AC
  Administered 2018-06-22: 1 via TOPICAL
  Filled 2018-06-22: qty 22

## 2018-06-22 MED ORDER — CHLORHEXIDINE GLUCONATE 4 % EX LIQD: 60.0000 mL | Freq: Once | CUTANEOUS | Status: DC

## 2018-06-22 MED ORDER — MUPIROCIN 2 % EX OINT
1.0000 "application " | TOPICAL_OINTMENT | Freq: Once | CUTANEOUS | Status: AC
  Administered 2018-06-22: 1 via TOPICAL

## 2018-06-22 MED ORDER — MUPIROCIN 2 % EX OINT
1.0000 "application " | TOPICAL_OINTMENT | Freq: Two times a day (BID) | CUTANEOUS | Status: DC
  Administered 2018-06-22 – 2018-06-23 (×2): 1 via NASAL
  Filled 2018-06-22 (×2): qty 22

## 2018-06-22 MED ORDER — CEFAZOLIN SODIUM-DEXTROSE 2-4 GM/100ML-% IV SOLN
INTRAVENOUS | Status: AC
  Filled 2018-06-22: qty 100

## 2018-06-22 MED ORDER — SODIUM CHLORIDE 0.9 % IV SOLN
INTRAVENOUS | Status: AC
  Filled 2018-06-22: qty 2

## 2018-06-22 MED ORDER — INSULIN ASPART 100 UNIT/ML ~~LOC~~ SOLN
0.0000 [IU] | Freq: Every day | SUBCUTANEOUS | Status: DC
  Administered 2018-06-22: 22:00:00 3 [IU] via SUBCUTANEOUS

## 2018-06-22 MED ORDER — HEPARIN (PORCINE) IN NACL 1000-0.9 UT/500ML-% IV SOLN
INTRAVENOUS | Status: DC | PRN
  Administered 2018-06-22: 500 mL

## 2018-06-22 MED ORDER — IOHEXOL 350 MG/ML SOLN
INTRAVENOUS | Status: DC | PRN
  Administered 2018-06-22: 15 mL via INTRAVENOUS

## 2018-06-22 MED ORDER — CEFAZOLIN SODIUM-DEXTROSE 1-4 GM/50ML-% IV SOLN
1.0000 g | Freq: Four times a day (QID) | INTRAVENOUS | Status: AC
  Administered 2018-06-22 – 2018-06-23 (×3): 1 g via INTRAVENOUS
  Filled 2018-06-22 (×3): qty 50

## 2018-06-22 MED ORDER — LIDOCAINE HCL (PF) 1 % IJ SOLN
INTRAMUSCULAR | Status: AC
  Filled 2018-06-22: qty 60

## 2018-06-22 MED ORDER — LIDOCAINE HCL (PF) 1 % IJ SOLN
INTRAMUSCULAR | Status: DC | PRN
  Administered 2018-06-22: 45 mL

## 2018-06-22 MED ORDER — SODIUM CHLORIDE 0.9 % IV SOLN
80.0000 mg | INTRAVENOUS | Status: AC
  Administered 2018-06-22: 80 mg

## 2018-06-22 SURGICAL SUPPLY — 15 items
CABLE SURGICAL S-101-97-12 (CABLE) ×6 IMPLANT
CATH CPS DIRECT 135 DS2C020 (CATHETERS) ×3 IMPLANT
HEMOSTAT SURGICEL 2X4 FIBR (HEMOSTASIS) ×3 IMPLANT
ICD VIGILANT DF4 G247 (ICD Generator) ×3 IMPLANT
KIT MICROPUNCTURE NIT STIFF (SHEATH) ×3 IMPLANT
LEAD QUARTET 1456Q-86 (Lead) ×1 IMPLANT
LEAD RELIANCE G DF4 0293 (Lead) ×3 IMPLANT
PAD PRO RADIOLUCENT 2001M-C (PAD) ×6 IMPLANT
QUARTET 1456Q-86 (Lead) ×3 IMPLANT
SHEATH CLASSIC 7F (SHEATH) ×3 IMPLANT
SHEATH CLASSIC 9.5F (SHEATH) ×6 IMPLANT
SLITTER UNIVERSAL DS2A003 (MISCELLANEOUS) ×3 IMPLANT
TRAY PACEMAKER INSERTION (PACKS) ×6 IMPLANT
WIRE ACUITY WHISPER EDS 4648 (WIRE) ×3 IMPLANT
WIRE HI TORQ VERSACORE-J 145CM (WIRE) ×6 IMPLANT

## 2018-06-22 NOTE — Interval H&P Note (Signed)
History and Physical Interval Note:  06/22/2018 9:04 AM  Crystal [name]  has presented today for surgery, with the diagnosis of lower ef  The various methods of treatment have been discussed with the patient and family. After consideration of risks, benefits and other options for treatment, the patient has consented to  Procedure(s): BIV ICD [place] (N/A) as a surgical intervention .  The patient's history has been reviewed, patient examined, no change in status, stable for surgery.  I have reviewed the patient's chart and labs.  Questions were answered to the patient's satisfaction.     Virl Axe  Interval improvement in functional status PE notable for no JVP    Reviewed plan Venogram and R/S lead replacment if patent otherwise will pace predominantly LV

## 2018-06-22 NOTE — Discharge Instructions (Signed)
° ° °  Supplemental Discharge Instructions for  Pacemaker/Defibrillator Patients  Activity No heavy lifting or vigorous activity with your left/right arm for 6 to 8 weeks.  Do not raise your left/right arm above your head for one week.  Gradually raise your affected arm as drawn below.             06/26/18                     06/27/18                     06/28/18                   06/29/18   NO DRIVING for  1 week   ; you may begin driving on   71/1/65  .  WOUND CARE - Keep the wound area clean and dry.  Do not get this area wet for one week. No showers for one week; you may shower on 06/29/18  . - The tape/steri-strips on your wound will fall off; do not pull them off.  No bandage is needed on the site.  DO  NOT apply any creams, oils, or ointments to the wound area. - If you notice any drainage or discharge from the wound, any swelling or bruising at the site, or you develop a fever > 101? F after you are discharged home, call the office at once.  Special Instructions - You are still able to use cellular telephones; use the ear opposite the side where you have your pacemaker/defibrillator.  Avoid carrying your cellular phone near your device. - When traveling through airports, show security personnel your identification card to avoid being screened in the metal detectors.  Ask the security personnel to use the hand wand. - Avoid arc welding equipment, MRI testing (magnetic resonance imaging), TENS units (transcutaneous nerve stimulators).  Call the office for questions about other devices. - Avoid electrical appliances that are in poor condition or are not properly grounded. - Microwave ovens are safe to be near or to operate.  Additional information for defibrillator patients should your device go off: - If your device goes off ONCE and you feel fine afterward, notify the device clinic nurses. - If your device goes off ONCE and you do not feel well afterward, call 911. - If your device goes  off TWICE, call 911. - If your device goes off THREE times in one day, call 911.  DO NOT DRIVE YOURSELF OR A FAMILY MEMBER WITH A DEFIBRILLATOR TO THE HOSPITAL--CALL 911.

## 2018-06-22 NOTE — Interval H&P Note (Signed)
ICD Criteria  Current LVEF:25%. Within 12 months prior to implant: Yes   Heart failure history: Yes, Class III  Cardiomyopathy history: Yes, Non-Ischemic Cardiomyopathy.  Atrial Fibrillation/Atrial Flutter: No.  Ventricular tachycardia history: No.  Cardiac arrest history: No.  History of syndromes with risk of sudden death: No.  Previous ICD: Yes, Reason for ICD:  Primary prevention.  Current ICD indication: Primary  PPM indication: No.  Class I or II Bradycardia indication present: No  Beta Blocker therapy for 3 or more months: Yes, prescribed.   Ace Inhibitor/ARB therapy for 3 or more months: Yes, prescribed.    I have seen Crystal [name] is a 67 y.o. femalepre-procedural and has been referred by JV for consideration of ICD re implant for primary prevention of sudden death.  The patient's chart has been reviewed and they meet criteria for ICD implant.  I have had a thorough discussion with the patient reviewing options.  The patient and their family (if available) have had opportunities to ask questions and have them answered. The patient and I have decided together through the [facility] [employer] to reimplant ICD at this time.  Risks, benefits, alternatives to ICD implantation were discussed in detail with the patient today. The patient  understands that the risks include but are not limited to bleeding, infection, pneumothorax, perforation, tamponade, vascular damage, renal failure, MI, stroke, death, inappropriate shocks, and lead dislodgement and  wishes to proceed.  History and Physical Interval Note:  06/22/2018 9:06 AM  Crystal [name]  has presented today for surgery, with the diagnosis of lower ef  The various methods of treatment have been discussed with the patient and family. After consideration of risks, benefits and other options for treatment, the patient has consented to  Procedure(s): BIV ICD [place] (N/A) as a surgical  intervention .  The patient's history has been reviewed, patient examined, no change in status, stable for surgery.  I have reviewed the patient's chart and labs.  Questions were answered to the patient's satisfaction.     Virl Axe

## 2018-06-22 NOTE — Social Work (Signed)
CSW acknowledging consult for access to medications at discharge.  For medication access please consult RN Case Management.   CSW signing off. Please consult if any additional needs arise.  Cheney Ewart, MSW, LCSWA [place] Clinical Social Work [phone]     

## 2018-06-23 ENCOUNTER — Ambulatory Visit (HOSPITAL_COMMUNITY): Payer: Medicare Other

## 2018-06-23 DIAGNOSIS — E1142 Type 2 diabetes mellitus with diabetic polyneuropathy: Secondary | ICD-10-CM | POA: Diagnosis not present

## 2018-06-23 DIAGNOSIS — I447 Left bundle-branch block, unspecified: Secondary | ICD-10-CM | POA: Diagnosis not present

## 2018-06-23 DIAGNOSIS — I5023 Acute on chronic systolic (congestive) heart failure: Secondary | ICD-10-CM | POA: Diagnosis not present

## 2018-06-23 DIAGNOSIS — I130 Hypertensive heart and chronic kidney disease with heart failure and stage 1 through stage 4 chronic kidney disease, or unspecified chronic kidney disease: Secondary | ICD-10-CM | POA: Diagnosis not present

## 2018-06-23 DIAGNOSIS — E785 Hyperlipidemia, unspecified: Secondary | ICD-10-CM | POA: Diagnosis not present

## 2018-06-23 DIAGNOSIS — R0989 Other specified symptoms and signs involving the circulatory and respiratory systems: Secondary | ICD-10-CM | POA: Diagnosis not present

## 2018-06-23 DIAGNOSIS — N183 Chronic kidney disease, stage 3 (moderate): Secondary | ICD-10-CM | POA: Diagnosis not present

## 2018-06-23 DIAGNOSIS — I519 Heart disease, unspecified: Secondary | ICD-10-CM | POA: Diagnosis not present

## 2018-06-23 DIAGNOSIS — F329 Major depressive disorder, single episode, unspecified: Secondary | ICD-10-CM | POA: Diagnosis not present

## 2018-06-23 DIAGNOSIS — E559 Vitamin D deficiency, unspecified: Secondary | ICD-10-CM | POA: Diagnosis not present

## 2018-06-23 DIAGNOSIS — E1122 Type 2 diabetes mellitus with diabetic chronic kidney disease: Secondary | ICD-10-CM | POA: Diagnosis not present

## 2018-06-23 DIAGNOSIS — Z006 Encounter for examination for normal comparison and control in clinical research program: Secondary | ICD-10-CM | POA: Diagnosis not present

## 2018-06-23 DIAGNOSIS — E039 Hypothyroidism, unspecified: Secondary | ICD-10-CM | POA: Diagnosis not present

## 2018-06-23 LAB — GLUCOSE, CAPILLARY: Glucose-Capillary: 142 mg/dL — ABNORMAL HIGH (ref 70–99)

## 2018-06-23 NOTE — Discharge Summary (Addendum)
Discharge Summary    Patient ID: TIFANNY DOLLENS MRN: [id number]; DOB: June 21, 1951  Admit date: 06/22/2018 Discharge date: 06/23/2018  Primary Care Provider: Crist Infante, MD  Primary Cardiologist: Larae Grooms, MD  Primary Electrophysiologist:  Dr. Caryl Comes  Discharge Diagnoses    Principal Problem:   Congestive heart failure (CHF) [facility]) Active Problems:   Nonischemic cardiomyopathy ([place])   LBBB (left bundle branch block)   Chronic systolic dysfunction of left ventricle   Chronic renal insufficiency   Essential hypertension   Morbid obesity ([place])   Diabetes mellitus, type 2 ([place])   HLD (hyperlipidemia)   S/P colostomy ([place])   Allergies Allergies  Allergen Reactions  . Glucophage [Metformin Hcl] Other (See Comments)    Renal failure  . Ace Inhibitors Swelling and Other (See Comments)    Angioedema  . Advicor [Niacin-Lovastatin Er] Other (See Comments)    Muscle aches  . Bystolic [Nebivolol Hcl] Other (See Comments)    Edema  . Erythromycin Diarrhea, Nausea And Vomiting and Other (See Comments)    *DERIVATIVES*  . Lipitor [Atorvastatin] Other (See Comments)    Muscle aches  . Lopid [Gemfibrozil] Other (See Comments)    Muscle aches  . Statins Other (See Comments)    Muscle aches  . Telmisartan     AVOID ARB/ ACEi in this patient due to recurrent AKI  . Adhesive [Tape] Rash    Can not use for long periods of time    Diagnostic Studies/Procedures    PPM 06/22/2018 previously implanted atrial lead Guidant [address] [employer] 0293-lead serial #[id number] was positioned without difficulty in the RV apex [place] Jude quadripolar lead 1761 serial number [id number] [employer] pulse generator, serial number [id number] _____________   History of Present Illness     She has a history of nonischemic myopathy dating back to 2005.  EF at that time was 24% catheterization showed no obstructive coronary disease and she had left bundle branch block.    She  underwent biventricular ICD implantation with epicardial lead; the battery depleted and underwent device generator reimplantation about 2009.  About 2015 her second device reached ERI.  With interval normalization the decision was made [facility]) not to reimplant; her QRS duration 10/15 was less than 120 ms  Multiple ECGs were reviewed from 2017--2019.  Even with rates as low as 75 left bundle branch block was present ECG 10/19 demonstrated recurrent left bundle branch block  DATE TEST EF   2005 Echo   25 %   2005 LHC  No obstructive CAD  10/15 Echo  50-55%   2018 Echo  50%   11/19 Echo  25-30%   11/19 LHC  No obstructive CAD   She was seen by Dr. Saundra Shelling 10/19 with symptoms of worsening heart failure.  Echocardiogram demonstrated EF as above with subsequent catheterization.  There is been interval 30 pound weight gain since the summer.  She has orthopnea nocturnal dyspnea dyspnea on exertion.  She does not have peripheral edema.  Review of her last interrogation in [place] demonstrated a high RV threshold and a reasonable LV threshold.   Hospital Course     Given her recent drop in EF, the decision was made to replace the biventricular device in order to help with ventricular synergy.  She has had great success in the past with biventricular pacing improving ejection fraction.  She was brought to [facility] on 06/22/2018 and underwent implantation of a [place] biventricular device.  Postprocedure, patient recovered well without  significant complication.  Morning chest x-ray and device interrogation were normal.  Patient was seen in the morning of 06/23/2018 by Dr. Curt Bears, at which time she was doing well without significant issue.  She continued to have some mild soreness near the left pectoral incision area however the incision looks clean and dry without sign of infection.  Postprocedural care instruction has been given to the patient.  She is deemed stable for discharge  from electrophysiology perspective.   Note, despite her history of angioedema, patient says she has been able to take [facility] without any issue. She says she only got 30 day supplies of Entresto, I Briana Newman send staff message to prescribing provider to help with medication assistance. I Jeraldean Wechter also ask if our case manager can help as well before she leave the hospital.   _____________  Discharge Vitals Blood pressure 132/69, pulse 84, temperature 98 F (36.7 C), temperature source Oral, resp. rate 19, height 5\' 6"  (1.676 m), weight 98.9 kg, SpO2 95 %.  Filed Weights   06/22/18 0829 06/23/18 0418  Weight: 102.1 kg 98.9 kg   GEN:No acute distress.   Neck:No JVD Cardiac:RRR, no murmurs, rubs, or gallops.  L pectoral incision area clean and dry, no hematoma or seroma felt Respiratory:Clear to auscultation bilaterally. LP:FXTK, nontender, non-distended  MS:No edema; No deformity. Neuro:Nonfocal  Psych: Normal affect   Labs & Radiologic Studies    _____________  Dg Chest 2 View  Result Date: 06/23/2018 CLINICAL DATA:  Cardiac device in situ. EXAM: CHEST - 2 VIEW COMPARISON:  05/08/2018 FINDINGS: There has been interval ICD revision with generator replacement. New right ventricular and coronary sinus leads have been placed. The cardiac silhouette remains enlarged. There is mild pulmonary vascular congestion without overt edema, airspace consolidation, pleural effusion, or pneumothorax. IMPRESSION: 1. Interval ICD revision. No pneumothorax. 2. Cardiomegaly and mild pulmonary vascular congestion. Electronically Signed   By: Logan Bores M.D.   On: 06/23/2018 07:02   Disposition   Pt is being discharged home today in good condition.  Follow-up Plans & Appointments    Follow-up Information    [place] [facility] Follow up.   Specialty:  Cardiology Why:  07/05/18 @ 9:00AM, wound check visit Contact information: [address]  [place]       Deboraha Sprang, MD Follow up.   Specialty:  Cardiology Why:  You Sena Hoopingarner be called by Dr. Olin Pia scheduler to make a 3 month follow up appointment Contact information: [address] [place] [address] [phone]          Discharge Instructions    Diet - low sodium heart healthy   Complete by:  As directed    Increase activity slowly   Complete by:  As directed       Discharge Medications   Allergies as of 06/23/2018      Reactions   Glucophage [metformin Hcl] Other (See Comments)   Renal failure   Ace Inhibitors Swelling, Other (See Comments)   Angioedema   Advicor [niacin-lovastatin Er] Other (See Comments)   Muscle aches   Bystolic [nebivolol Hcl] Other (See Comments)   Edema   Erythromycin Diarrhea, Nausea And Vomiting, Other (See Comments)   *DERIVATIVES*   Lipitor [atorvastatin] Other (See Comments)   Muscle aches   Lopid [gemfibrozil] Other (See Comments)   Muscle aches   Statins Other (See Comments)   Muscle aches   Telmisartan    AVOID  ARB/ ACEi in this patient due to recurrent AKI   Adhesive [tape] Rash   Can not use for long periods of time      Medication List    TAKE these medications   acetaminophen 500 MG tablet Commonly known as:  TYLENOL Take 1,000 mg by mouth every 6 (six) hours as needed for moderate pain or headache.   albuterol 108 (90 Base) MCG/ACT inhaler Commonly known as:  PROVENTIL HFA;VENTOLIN HFA Inhale 1-2 puffs into the lungs every 6 (six) hours as needed for wheezing or shortness of breath.   allopurinol 100 MG tablet Commonly known as:  ZYLOPRIM TAKE 1 TABLET(100 MG) BY MOUTH DAILY What changed:  See the new instructions.   CALTRATE 600+D 600-400 MG-UNIT tablet Generic drug:  Calcium Carbonate-Vitamin D Take 1 tablet by mouth 2 (two) times daily.   carvedilol 12.5 MG tablet Commonly known as:  COREG Take 12.5 mg by mouth 2 (two) times daily with a meal.   CRESTOR 10  MG tablet Generic drug:  rosuvastatin Take 1 tablet (10 mg total) by mouth at bedtime.   diclofenac sodium 1 % Gel Commonly known as:  VOLTAREN Apply 2 g topically 4 (four) times daily as needed (for pain.).   DULoxetine 30 MG capsule Commonly known as:  CYMBALTA TAKE ONE CAPSULE BY MOUTH DAILY   gabapentin 100 MG capsule Commonly known as:  NEURONTIN TAKE 1 CAPSULE BY MOUTH EVERY MORNING   gabapentin 800 MG tablet Commonly known as:  NEURONTIN TAKE 1 TABLET BY MOUTH DAILY AT BEDTIME   Insulin Glulisine 100 UNIT/ML Solostar Pen Commonly known as:  APIDRA Inject 25 Units into the skin 3 (three) times daily.   levothyroxine 88 MCG tablet Commonly known as:  SYNTHROID, LEVOTHROID Take 88 mcg by mouth daily before breakfast.   loratadine 10 MG tablet Commonly known as:  CLARITIN Take 10 mg by mouth daily.   montelukast 10 MG tablet Commonly known as:  SINGULAIR Take 10 mg by mouth at bedtime.   omega-3 acid ethyl esters 1 g capsule Commonly known as:  LOVAZA Take 4 g by mouth at bedtime.   OXYGEN Inhale 2 L into the lungs at bedtime.   pantoprazole 40 MG tablet Commonly known as:  PROTONIX Take 40 mg by mouth every evening.   potassium chloride SA 20 MEQ tablet Commonly known as:  K-DUR,KLOR-CON Take 1 tablet (20 mEq total) by mouth daily.   sacubitril-valsartan 24-26 MG Commonly known as:  ENTRESTO Take 1 tablet by mouth 2 (two) times daily.   temazepam 15 MG capsule Commonly known as:  RESTORIL Take 1 capsule (15 mg total) by mouth at bedtime as needed for sleep.   torsemide 20 MG tablet Commonly known as:  DEMADEX TAKE 40 MG FOR 3 DAYS THEN DECREASE TO 20MG  EVERY DAY What changed:    how much to take  how to take this  when to take this  additional instructions   TOUJEO MAX SOLOSTAR 300 UNIT/ML Sopn Generic drug:  Insulin Glargine Inject 110 Units into the skin every evening.        Acute coronary syndrome (MI, NSTEMI, STEMI, etc) this  admission?: No.    Outstanding Labs/Studies   N/A  Duration of Discharge Encounter   Greater than 30 minutes including physician time.  SignedAlmyra Deforest, PA 06/23/2018, 8:35 AM    I have seen and examined this patient with Almyra Deforest.  Agree with above, note added to reflect my findings.  On exam,  RRR, no murmurs, lungs clear.  Patient mid to the hospital for ICD generator change.  Also had RV lead and LV lead addition.  Patient tolerated the procedure well.  Chest x-ray and interrogation without issues today.  Plan for discharge with follow-up in device clinic.  Rawan Riendeau M. Sunny Gains MD 06/23/2018 8:43 AM

## 2018-06-23 NOTE — Care Management (Signed)
Pt started on Entresto 2 weeks ago and used 30-day-free card given by PCP at that time.  Pt has Medicare, so copay card not applicable.  Pt will seek out samples, cost of copay, and assistance from [employer] in next 2 weeks.

## 2018-06-25 ENCOUNTER — Encounter (HOSPITAL_COMMUNITY): Payer: Self-pay | Admitting: Internal Medicine

## 2018-06-25 LAB — GLUCOSE, CAPILLARY: Glucose-Capillary: 195 mg/dL — ABNORMAL HIGH (ref 70–99)

## 2018-06-27 ENCOUNTER — Encounter (HOSPITAL_COMMUNITY): Payer: Self-pay | Admitting: Internal Medicine

## 2018-07-03 NOTE — Telephone Encounter (Signed)
Called pt and explained her surgeon will need to send [place] a surgical clearance request form. Pt understands her surgeon's office should put the type of procedure, the date of the procedure, and the type of clearance needed. Fax number to the office was provided. Pt verbalized understanding and had no additional questions.

## 2018-07-05 ENCOUNTER — Telehealth: Payer: Self-pay | Admitting: *Deleted

## 2018-07-05 ENCOUNTER — Ambulatory Visit (INDEPENDENT_AMBULATORY_CARE_PROVIDER_SITE_OTHER): Payer: Medicare Other | Admitting: *Deleted

## 2018-07-05 DIAGNOSIS — I447 Left bundle-branch block, unspecified: Secondary | ICD-10-CM | POA: Diagnosis not present

## 2018-07-05 LAB — CUP PACEART INCLINIC DEVICE CHECK
Date Time Interrogation Session: 20191212050000
HighPow Impedance: 65 Ohm
Implantable Lead Implant Date: 20191129
Implantable Lead Implant Date: 20191129
Implantable Lead Implant Date: 20191129
Implantable Lead Location: 753858
Implantable Lead Location: 753859
Implantable Lead Location: 753860
Implantable Lead Model: 293
Implantable Lead Model: 4086
Implantable Lead Serial Number: 226691
Implantable Lead Serial Number: 440868
Implantable Pulse Generator Implant Date: 20191129
Lead Channel Impedance Value: 508 Ohm
Lead Channel Impedance Value: 519 Ohm
Lead Channel Pacing Threshold Amplitude: 0.5 V
Lead Channel Pacing Threshold Amplitude: 1.4 V
Lead Channel Pacing Threshold Amplitude: 1.4 V
Lead Channel Pacing Threshold Pulse Width: 0.4 ms
Lead Channel Pacing Threshold Pulse Width: 0.4 ms
Lead Channel Pacing Threshold Pulse Width: 1 ms
Lead Channel Sensing Intrinsic Amplitude: 15.2 mV
Lead Channel Sensing Intrinsic Amplitude: 25 mV
Lead Channel Sensing Intrinsic Amplitude: 4 mV
Lead Channel Setting Pacing Amplitude: 3.5 V
Lead Channel Setting Pacing Amplitude: 3.5 V
Lead Channel Setting Pacing Pulse Width: 0.4 ms
Lead Channel Setting Pacing Pulse Width: 1 ms
Lead Channel Setting Sensing Sensitivity: 0.5 mV
Lead Channel Setting Sensing Sensitivity: 1 mV
MDC IDC MSMT LEADCHNL RA IMPEDANCE VALUE: 657 Ohm
MDC IDC SET LEADCHNL RA PACING AMPLITUDE: 2.5 V
Pulse Gen Serial Number: 227627

## 2018-07-05 NOTE — Telephone Encounter (Signed)
   [facility] Pre-operative Risk Assessment    Request for surgical clearance:  1. What type of surgery is being performed? EXCISION OF CHRONIC WOUND   2. When is this surgery scheduled? TBD   3. What type of clearance is required (medical clearance vs. Pharmacy clearance to hold med vs. Both)?  MEDICAL  4. Are there any medications that need to be held prior to surgery and how long? N/A   5. Practice name and name of physician performing surgery?  [facility] / DR. Hulen Skains   6. What is your office phone number [phone]    7.   What is your office fax number [phone]  8.   Anesthesia type (None, local, MAC, general) ?  GENERAL   Crystal [name] 07/05/2018, 11:22 AM  _________________________________________________________________   (provider comments below)

## 2018-07-05 NOTE — Progress Notes (Signed)
Wound CRT-D device check in office. Dermabond removed. Wound without redness or edema. Stitch clipped from right incision corner, otherwise wound well healed. Normal device function. Thresholds, sensing, and impedances consistent with implant measurements. Device programmed at 3.5V for extra safety margin until 3 month visit. Patient bi-ventricularly pacing 99% of the time. AMS <30sec. Device programmed with appropriate safety margins. Heart failure diagnostics reviewed and trends are stable for patient . No changes made this session. Estimated longevity 7.5 years. Patient educated about wound care, arm mobility, lifting restrictions, shock plan. ROV in 3 months with implanting physician.

## 2018-07-06 ENCOUNTER — Other Ambulatory Visit: Payer: Self-pay

## 2018-07-06 ENCOUNTER — Encounter (HOSPITAL_COMMUNITY): Payer: Self-pay | Admitting: Urology

## 2018-07-06 NOTE — Telephone Encounter (Signed)
See attached

## 2018-07-06 NOTE — Anesthesia Preprocedure Evaluation (Deleted)
Anesthesia Evaluation    History of Anesthesia Complications (+) PONV and history of anesthetic complications  Airway        Dental   Pulmonary           Cardiovascular hypertension,      Neuro/Psych    GI/Hepatic   Endo/Other  diabetes  Renal/GU      Musculoskeletal   Abdominal   Peds  Hematology   Anesthesia Other Findings   Reproductive/Obstetrics                                                             Anesthesia Evaluation  Patient identified by MRN, date of birth, ID band Patient awake    Reviewed: Allergy & Precautions, H&P , NPO status , Patient's Chart, lab work & pertinent test results  History of Anesthesia Complications (+) PONV  Airway Mallampati: II  TM Distance: >3 FB Neck ROM: Full    Dental no notable dental hx. (+) Teeth Intact, Dental Advisory Given   Pulmonary asthma , sleep apnea ,    Pulmonary exam normal breath sounds clear to auscultation       Cardiovascular hypertension, + CAD and +CHF  + dysrhythmias + Cardiac Defibrillator  Rhythm:Regular Rate:Normal     Neuro/Psych Anxiety Depression negative neurological ROS     GI/Hepatic Neg liver ROS, GERD  ,  Endo/Other  diabetes, Insulin DependentHypothyroidism   Renal/GU Renal InsufficiencyRenal disease  negative genitourinary   Musculoskeletal  (+) Arthritis , Osteoarthritis,    Abdominal (+) + obese,   Peds  Hematology negative hematology ROS (+) anemia ,   Anesthesia Other Findings   Reproductive/Obstetrics negative OB ROS                            Anesthesia Physical Anesthesia Plan  ASA: III  Anesthesia Plan: General   Post-op Pain Management:    Induction: Intravenous, Rapid sequence and Cricoid pressure planned  PONV Risk Score and Plan: 4 or greater and Ondansetron, Midazolam and Scopolamine patch - Pre-op  Airway Management  Planned: Oral ETT  Additional Equipment:   Intra-op Plan:   Post-operative Plan: Extubation in OR  Informed Consent: I have reviewed the patients History and Physical, chart, labs and discussed the procedure including the risks, benefits and alternatives for the proposed anesthesia with the patient or authorized representative who has indicated his/her understanding and acceptance.   Dental advisory given  Plan Discussed with: CRNA  Anesthesia Plan Comments:         Anesthesia Quick Evaluation                                   Anesthesia Evaluation  Patient identified by MRN, date of birth, ID band Patient awake    Reviewed: Allergy & Precautions, H&P , NPO status , Patient's Chart, lab work & pertinent test results  History of Anesthesia Complications (+) PONV  Airway Mallampati: II  TM Distance: >3 FB Neck ROM: Full    Dental no notable dental hx. (+) Teeth Intact, Dental Advisory Given   Pulmonary asthma , sleep apnea ,    Pulmonary exam normal breath sounds clear to auscultation  Cardiovascular hypertension, + CAD and +CHF  + dysrhythmias + Cardiac Defibrillator  Rhythm:Regular Rate:Normal     Neuro/Psych Anxiety Depression negative neurological ROS     GI/Hepatic Neg liver ROS, GERD  ,  Endo/Other  diabetes, Insulin DependentHypothyroidism   Renal/GU Renal InsufficiencyRenal disease  negative genitourinary   Musculoskeletal  (+) Arthritis , Osteoarthritis,    Abdominal (+) + obese,   Peds  Hematology negative hematology ROS (+) anemia ,   Anesthesia Other Findings   Reproductive/Obstetrics negative OB ROS                            Anesthesia Physical Anesthesia Plan  ASA: III  Anesthesia Plan: General   Post-op Pain Management:    Induction: Intravenous, Rapid sequence and Cricoid pressure planned  PONV Risk Score and Plan: 4 or greater and Ondansetron, Midazolam and Scopolamine patch  - Pre-op  Airway Management Planned: Oral ETT  Additional Equipment:   Intra-op Plan:   Post-operative Plan: Extubation in OR  Informed Consent: I have reviewed the patients History and Physical, chart, labs and discussed the procedure including the risks, benefits and alternatives for the proposed anesthesia with the patient or authorized representative who has indicated his/her understanding and acceptance.   Dental advisory given  Plan Discussed with: CRNA  Anesthesia Plan Comments:         Anesthesia Quick Evaluation                                   Anesthesia Evaluation  Patient identified by MRN, date of birth, ID band Patient awake    Reviewed: Allergy & Precautions, H&P , NPO status , Patient's Chart, lab work & pertinent test results  History of Anesthesia Complications (+) PONV  Airway Mallampati: II  TM Distance: >3 FB Neck ROM: Full    Dental no notable dental hx. (+) Teeth Intact, Dental Advisory Given   Pulmonary asthma , sleep apnea ,    Pulmonary exam normal breath sounds clear to auscultation       Cardiovascular hypertension, + CAD and +CHF  + dysrhythmias + Cardiac Defibrillator  Rhythm:Regular Rate:Normal     Neuro/Psych Anxiety Depression negative neurological ROS     GI/Hepatic Neg liver ROS, GERD  ,  Endo/Other  diabetes, Insulin DependentHypothyroidism   Renal/GU Renal InsufficiencyRenal disease  negative genitourinary   Musculoskeletal  (+) Arthritis , Osteoarthritis,    Abdominal (+) + obese,   Peds  Hematology negative hematology ROS (+) anemia ,   Anesthesia Other Findings   Reproductive/Obstetrics negative OB ROS                            Anesthesia Physical Anesthesia Plan  ASA: III  Anesthesia Plan: General   Post-op Pain Management:    Induction: Intravenous, Rapid sequence and Cricoid pressure planned  PONV Risk Score and Plan: 4 or greater and Ondansetron,  Midazolam and Scopolamine patch - Pre-op  Airway Management Planned: Oral ETT  Additional Equipment:   Intra-op Plan:   Post-operative Plan: Extubation in OR  Informed Consent: I have reviewed the patients History and Physical, chart, labs and discussed the procedure including the risks, benefits and alternatives for the proposed anesthesia with the patient or authorized representative who has indicated his/her understanding and acceptance.   Dental advisory given  Plan Discussed with:  CRNA  Anesthesia Plan Comments:         Anesthesia Quick Evaluation  Anesthesia Physical Anesthesia Plan  ASA: IV  Anesthesia Plan: General   Post-op Pain Management:    Induction: Intravenous  PONV Risk Score and Plan: 3 and Ondansetron and Treatment may vary due to age or medical condition  Airway Management Planned: Oral ETT  Additional Equipment:   Intra-op Plan:   Post-operative Plan: Extubation in OR  Informed Consent:   Plan Discussed with:   Anesthesia Plan Comments: (PAT note written 07/06/2018 by Myra Gianotti, PA-C.  EKG: 06/23/18: Atrial-sensed ventricular paced rhythm. Biventricular pacing R in V1.   CV: Cardiac cath 06/01/18:  Prox LAD lesion is 25% stenosed.  Ost 1st Diag lesion is 25% stenosed.  Mid LM lesion is 10% stenosed.  LV end diastolic pressure is mildly elevated. LVEDP 17 mm Hg.  There is no aortic valve stenosis.  Hemodynamic findings consistent with mild pulmonary hypertension.  Ao sat 98%, PA sat 66%, CO 4.79 L/min; CI 2.2, mean PA 30 mm Hg; mean PCWP 22 mm Hg No significant CAD.  Medical therapy for LV dysfunction.   Echo 05/25/18: Study Conclusions - Left ventricle: The cavity size was moderately dilated. Wall thickness was increased in a pattern of mild LVH. Left ventricular geometry showed evidence of eccentric hypertrophy. Systolic function was severely reduced. The estimated ejection fraction was in the range of  25% to 30%. Severe diffuse hypokinesis with no identifiable regional variations. Acoustic contrast opacification revealed no evidence ofthrombus. - Ventricular septum: Septal motion showed abnormal function, dyssynergy, and paradox. These changes are consistent with intraventricular conduction delay. - Mitral valve: Calcified annulus. There was moderate regurgitation directed centrally. The acceleration rate of the regurgitant jet was reduced, consistent with a low dP/dt. - Left atrium: The atrium was moderately dilated. - Right ventricle: Systolic function was mildly reduced. - Pericardium, extracardiac: A trivial pericardial effusion was identified. Impressions: - Compared to the 2017 study there is marked worsening of left ventricular ystolic function, in part related to marked septal-lateral wall dyssynhrony due to intraventricular conduction delay.)       Anesthesia Quick Evaluation

## 2018-07-06 NOTE — Progress Notes (Addendum)
PCP - Dr Joylene Draft (has not seen yet, was seeing Dr. Eulas Post) Cardiologist - Irish Lack EP- Dr. Caryl Comes  Chest x-ray - 06/23/18 EKG - 06/23/18 ECHO - 05/25/18 Cardiac Cath - 06/01/18  PPM/ICD- [employer]- orders form faxed to Dr. Caryl Comes (l Spoke Rep with [facility] regarding ICD/PPM- will need to call him once orders received or if he is needed AM of surgery- [phone] with 30 minutes notice at least)  CPAP - pt to bring mask with her DOS- pt also wears 2L O2 at night  Fasting Blood Sugar - "two-somethings" per patient. Per patient last A1c was around 10 in October  Anesthesia review: cardiac hx, ICD/PPM, blood sugars.   Pt denies Chest pain, states she sleeps on 1 pillow at night and feels much better since her lead revision.   Pt verbalized understanding of Pre-op instructions: CHECK CBG Q2H prior to arriving to hospital- if CBG less than 70 drink 1/2 cup clear juice (cranberry or apple), re-check in 15 minutes and if still less than 70 call [phone] Take 1/2 dose of Toujeo the morning of surgery (55 units), DO NOT take Insulin Apidra the morning of surgery (this is with meals only per patient) and do not take a bedtime dose of Apidra as well.

## 2018-07-06 NOTE — Progress Notes (Signed)
Anesthesia Chart Review: Crystal [name]   Case:  [id number] Date/Time:  07/09/18 0915   Procedure:  EXPLORATION OF  ABDOMINAL WOUND ERAS PATHWAY (N/A )   Anesthesia type:  General   Pre-op diagnosis:  non healing abdominal wound   Location:  [facility] OR ROOM 02 / [facility]   Surgeon:  Judeth Horn, MD      DISCUSSION: Patient is a 67 year old female scheduled for the above procedure.   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  Cardiology preoperative input by Cecilie Kicks, NP on 07/06/18 states, "...based on ACC/AHA guidelines, Crystal [name] would be at acceptable risk for the planned procedure without further cardiovascular testing.  She has minimal non-obstructive CAD and non ischemic cardiomyopathy.  Recent new BiV ICD placed." On 07/05/18, she had wound check and CRT-D device check in office. Normal device function.   Patient is  a same day work-up so further evaluation by her anesthesia team on the day of surgery. She does have preoperative cardiology input. Perioperative device prescription form still pending from EP cardiology. Patient has a Chemical engineer ICD device. Last A1c was 10.0 on 05/04/18, (in the setting of being on/off prednisone for 3 months; Apidra dose increased at that time). Reports fasting CBGs typically in the 200 range. She will get labs on arrival.    PROVIDERS: Crist Infante, MD is PCP (has not had actual visit yet), but previously seeing Gildardo Cranker, DO (last visit 05/04/18). Larae Grooms, MD is primary cardiologist. Last visit 05/23/18. Echo showed recurrent LV dysfunction. HF cardiologist Bensimhon suspected LBBB cardiomyopathy and recommended referral back to EP. Previously was seeing Laymond Purser, MD in [place], [place] then Carlyle Dolly, MD--but transitioned to Dr. Irish Lack once she moved to [place]. Erling Cruz, MD is nephrologist at [facility]. It's unclear who she will be seeing since his transition into an administrative role. 04/25/18 office note scanned under Media tab. Virl Axe, MD is EP cardiologist. Established 06/11/18.   LABS: She will need updated labs prior to surgery. Latest labs in [facility] Epic include: Lab Results  Component Value Date   WBC 9.5 06/11/2018   HGB 12.0 06/11/2018   HCT 37.2 06/11/2018   PLT 218 06/11/2018   GLUCOSE 143 (H) 06/11/2018   ALT 11 10/03/2017   AST 10 (A) 10/03/2017   NA 140 06/11/2018   K 4.4 06/11/2018   CL 98 06/11/2018   CREATININE 1.77 (H) 06/11/2018   BUN 34 (H) 06/11/2018   CO2 26 06/11/2018   TSH 0.53 05/04/2018   INR 1.11 08/23/2017   HGBA1C 10.0 (H) 05/04/2018   MICROALBUR 3.9  11/22/2017   Lab Results  Component Value Date   CREATININE 1.77 (H) 06/11/2018   CREATININE 1.90 (H) 06/01/2018   CREATININE 1.81 (H) 06/01/2018    IMAGES: CXR 06/23/18:  IMPRESSION: 1. Interval ICD  revision. No pneumothorax. 2. Cardiomegaly and mild pulmonary vascular congestion.   EKG: 06/23/18: Atrial-sensed ventricular paced rhythm. Biventricular pacing R in V1.   CV: Cardiac cath 06/01/18:  Prox LAD lesion is 25% stenosed.  Ost 1st Diag lesion is 25% stenosed.  Mid LM lesion is 10% stenosed.  LV end diastolic pressure is mildly elevated. LVEDP 17 mm Hg.  There is no aortic valve stenosis.  Hemodynamic findings consistent with mild pulmonary hypertension.  Ao sat 98%, PA sat 66%, CO 4.79 L/min; CI 2.2, mean PA 30 mm Hg; mean PCWP 22 mm Hg No significant CAD.  Medical therapy for LV dysfunction.   Echo 05/25/18: Study Conclusions - Left ventricle: The cavity size was moderately dilated. Wall   thickness was increased in a pattern of mild LVH. Left   ventricular geometry showed evidence of eccentric hypertrophy.   Systolic function was severely reduced. The estimated ejection   fraction was in the range of 25% to 30%. Severe diffuse   hypokinesis with no identifiable regional variations. Acoustic   contrast opacification revealed no evidence ofthrombus. - Ventricular septum: Septal motion showed abnormal function,   dyssynergy, and paradox. These changes are consistent with   intraventricular conduction delay. - Mitral valve: Calcified annulus. There was moderate regurgitation   directed centrally. The acceleration rate of the regurgitant jet   was reduced, consistent with a low dP/dt. - Left atrium: The atrium was moderately dilated. - Right ventricle: Systolic function was mildly reduced. - Pericardium, extracardiac: A trivial pericardial effusion was   identified. Impressions: - Compared to the 2017 study there is marked worsening of left   ventricular ystolic function, in part related to marked   septal-lateral wall dyssynhrony due to intraventricular   conduction delay. (Previously LVEF 55%, severe concentric LVH 01/19/17 [place], scanned under  Media tab.)    Past Medical History:  Diagnosis Date  . AICD (automatic cardioverter/defibrillator) present    high RV threshold chronically, device was turned off in 2014; Device battery has been dead x 7 years; "turned it back on 06/22/2018"  . Anemia, iron deficiency   . Angioedema    felt to likely be due to ace inhibitors but says she has had this even off of medicines, appears to be tolerating ARBs chronically  . Anxiety   . Arthritis    "hands, legs, arms; bad in my back" (06/22/2018)  . Asthmatic bronchitis with status asthmaticus   . Bradycardia   . CHF (congestive heart failure) ([place])   . Chronic lower back pain   . Chronic pain   . Chronic renal insufficiency   . CKD (chronic kidney disease), stage III ([place])   . Coronary artery disease    Mild nonobstructive (30% LAD, 30% RCA) by 09/01/16 cath at [facility]  . Degenerative joint disease   . Depression   . Diabetes mellitus, type 2 ([place])   . Diabetic peripheral neuropathy ([place])   . Dizziness   . Dyspnea on exertion   . Family history of adverse reaction to anesthesia    Mother has nausea  . GERD (gastroesophageal reflux disease)   . Gout   . Heart valve disorder   . History of blood transfusion 1981; ~ 2005; 12/2016   "childbirth; defibrillator OR; colostomy OR"  .  History of mononucleosis 03/2014  . Hyperlipidemia   . Hypertension   . Hypothyroid   . Insomnia   . Intervertebral disc degeneration   . Ischemic cardiomyopathy   . LBBB (left bundle branch block)   . Myocardial infarction () dx'd ~ 2005  . Nonischemic cardiomyopathy ([place])    s/p ICD in 2005. EF has since recovered  . Obesity   . On home oxygen therapy    "2L at night" (06/22/2018)  . OSA on CPAP   . Pneumonia    "couple times; last time was 12/2016" (06/22/2018)  . PONV (postoperative nausea and vomiting)   . Presence of permanent cardiac pacemaker    [employer]  . Swelling   . Syncope   . Systemic hypertension   . Vitamin  D deficiency     Past Surgical History:  Procedure Laterality Date  . APPENDECTOMY  07/13/2017  . BLADDER SUSPENSION  1990   "w/hysterectomy"  . CARDIAC CATHETERIZATION  2005   no obstructive CAD per patient  . CARDIAC CATHETERIZATION  2018  . CARDIAC DEFIBRILLATOR PLACEMENT  2005   BiV ICD implanted,  LV lead is an epicardial lead  . CARPAL TUNNEL RELEASE Left 07/25/2014   Dr.Williamson   . CATARACT EXTRACTION W/ INTRAOCULAR LENS  IMPLANT, BILATERAL Bilateral   . COLONIC STENT PLACEMENT N/A 08/31/2017   Procedure: COLONIC STENT PLACEMENT;  Surgeon: Carol Ada, MD;  Location: [facility];  Service: Endoscopy;  Laterality: N/A;  . COLONOSCOPY     2010-2011 Dr.Kipreos   . COLOSTOMY  12/2016   Archie Endo 01/26/2017  . COLOSTOMY REVERSAL N/A 07/13/2017   Procedure: COLOSTOMY REVERSAL;  Surgeon: Judeth Horn, MD;  Location: [facility];  Service: General;  Laterality: N/A;  . CYST REMOVAL HAND Right 05/2013   thumb  . DILATION AND CURETTAGE OF UTERUS  X 5-6  . FLEXIBLE SIGMOIDOSCOPY N/A 08/24/2017   Procedure: FLEXIBLE SIGMOIDOSCOPY;  Surgeon: Carol Ada, MD;  Location: [place];  Service: Endoscopy;  Laterality: N/A;  . FLEXIBLE SIGMOIDOSCOPY N/A 08/31/2017   Procedure: FLEXIBLE SIGMOIDOSCOPY;  Surgeon: Carol Ada, MD;  Location: [place];  Service: Endoscopy;  Laterality: N/A;  stent placement  . IMPLANTABLE CARDIOVERTER DEFIBRILLATOR GENERATOR CHANGE  2008  . LAPAROTOMY N/A 09/18/2017   Procedure: EXPLORATORY LAPAROTOMY;  Surgeon: Judeth Horn, MD;  Location: [place];  Service: General;  Laterality: N/A;  . LEAD REVISION  06/22/2018  . LEAD REVISION/REPAIR N/A 06/22/2018   Procedure: LEAD REVISION/REPAIR;  Surgeon: Deboraha Sprang, MD;  Location: [place] [facility];  Service: Cardiovascular;  Laterality: N/A;  . LYSIS OF ADHESION N/A 09/18/2017   Procedure: LYSIS OF ADHESION;  Surgeon: Judeth Horn, MD;  Location: [place];  Service: General;  Laterality: N/A;  . PARTIAL COLECTOMY N/A  09/18/2017   Procedure: ILEOCOLECTOMY;  Surgeon: Judeth Horn, MD;  Location: [place] [facility];  Service: General;  Laterality: N/A;  . PARTIAL COLECTOMY  09/25/2017  . RIGHT/LEFT HEART CATH AND CORONARY ANGIOGRAPHY N/A 06/01/2018   Procedure: RIGHT/LEFT HEART CATH AND CORONARY ANGIOGRAPHY;  Surgeon: Jettie Booze, MD;  Location: [place] [facility];  Service: Cardiovascular;  Laterality: N/A;  . SIGMOIDOSCOPY N/A 09/18/2017   Procedure: SIGMOIDOSCOPY;  Surgeon: Judeth Horn, MD;  Location: [place];  Service: General;  Laterality: N/A;  . TOTAL ABDOMINAL HYSTERECTOMY  1990  . TRIGGER FINGER RELEASE Right 05/213  . TUBAL LIGATION  1980s  . VESICOVAGINAL FISTULA CLOSURE W/ TAH      MEDICATIONS: No current facility-administered medications for  this encounter.    Marland Kitchen acetaminophen (TYLENOL) 500 MG tablet  . albuterol (PROVENTIL HFA;VENTOLIN HFA) 108 (90 Base) MCG/ACT inhaler  . allopurinol (ZYLOPRIM) 100 MG tablet  . Calcium Carbonate-Vitamin D (CALTRATE 600+D) 600-400 MG-UNIT tablet  . carvedilol (COREG) 12.5 MG tablet  . CRESTOR 10 MG tablet  . diclofenac sodium (VOLTAREN) 1 % GEL  . DULoxetine (CYMBALTA) 30 MG capsule  . gabapentin (NEURONTIN) 100 MG capsule  . gabapentin (NEURONTIN) 800 MG tablet  . Insulin Glargine (TOUJEO MAX SOLOSTAR) 300 UNIT/ML SOPN  . Insulin Glulisine (APIDRA) 100 UNIT/ML Solostar Pen  . levothyroxine (SYNTHROID, LEVOTHROID) 88 MCG tablet  . loratadine (CLARITIN) 10 MG tablet  . montelukast (SINGULAIR) 10 MG tablet  . omega-3 acid ethyl esters (LOVAZA) 1 g capsule  . OXYGEN  . pantoprazole (PROTONIX) 40 MG tablet  . sacubitril-valsartan (ENTRESTO) 24-26 MG  . temazepam (RESTORIL) 15 MG capsule  . torsemide (DEMADEX) 20 MG tablet  . potassium chloride SA (K-DUR,KLOR-CON) 20 MEQ tablet    [facility] Short Stay Center/Anesthesiology Phone [phone] 07/06/2018 1:17 PM

## 2018-07-06 NOTE — Telephone Encounter (Signed)
   Primary Cardiologist: Larae Grooms, MD  Chart reviewed as part of pre-operative protocol coverage. Given past medical history and time since last visit, based on ACC/AHA guidelines, Crystal [name] would be at acceptable risk for the planned procedure without further cardiovascular testing.  She has minimal non-obstructive CAD and non ischemic cardiomyopathy.  Recent new BiV ICD placed.    I will route this recommendation to the requesting party via Epic fax function and remove from pre-op pool.  Please call with questions.  Cecilie Kicks, NP 07/06/2018, 9:12 AM

## 2018-07-08 ENCOUNTER — Ambulatory Visit: Payer: Self-pay | Admitting: General Surgery

## 2018-07-08 NOTE — H&P (Signed)
Crystal [name] Documented: 05/01/2018 11:21 AM Location: [facility] Patient #: [id number] DOB: 1951/01/09 Widowed / Language: [name] / Race: Black or African American Female   The patient is a 67 year old female.  Medication History (Armen Ferguson, CMA; 05/01/2018 11:23 AM) Protonix (40MG  Tablet DR, 1 (one) Oral once daily, Taken starting 02/20/2018) Active. Montelukast Sodium (10MG  Tablet, Oral) Active. Ferrous Gluconate (324 (38 Fe)MG Tablet, Oral) Active. Ampicillin (500MG  Capsule, Oral) Active. OxyCODONE HCl (5MG  Tablet, Oral) Active. Telmisartan (20MG  Tablet, Oral) Active. Escitalopram Oxalate (10MG  Tablet, Oral) Active. Acetaminophen (500MG  Tablet, Oral) Active. Insulin Glulisine (100UNIT/ML Solution, Injection) Active. Loratadine (10MG  Tablet, Oral) Active. Nystatin (Topical) (100000 U/GM Cream, External) Active. TraMADol HCl ER (100MG  Capsule ER 24HR, Oral) Active. Crestor (10MG  Tablet, Oral) Active. Uloric (40MG  Tablet, Oral) Active. Omega-3-acid Ethyl Esters (1GM Capsule, Oral) Active. Carvedilol (6.25MG  Tablet, Oral) Active. Levothyroxine Sodium (88MCG Tablet, Oral) Active. Gabapentin (100MG  Capsule, Oral) Active. Medications Reconciled  Vitals (Armen Ferguson CMA; 05/01/2018 11:23 AM) 05/01/2018 11:22 AM Weight: 222 lb Height: 66in Body Surface Area: 2.09 m Body Mass Index: 35.83 kg/m  Temp.: 98.74F  Pulse: 88 (Regular)  P.OX: 94% (Room air) BP: 118/76 (Sitting, Left Arm, Standard)  BP 91/37 P79  Physical Exam (Katina Remick O. Kemaya Dorner MD; 05/01/2018 11:35 AM) Abdomen Note: No palpable hernia Non-healing wound with mostly bloody drainage This defect is granulating and still not healing, about 2.5-3 cm diameter.  Has  More inferior hernia defect which is not planned to be part of the operation .    Assessment & Plan Jeneen Rinks O. Vicenta Olds MD; 05/01/2018 11:38 AM) NONHEALING SURGICAL WOUND, SEQUELA (T81.89XS) Impression: Wound  continues to fester. Not healng, but not acutal fistula demonstrated. CT did not show a fistula, but I share concerns that the bowel is very close to this area.  Would consider exploration with excision of this area, but under general anesthesia because this could potentially involve small bowel.  Still not healing, bu I believe that the involvement with the bowel is not likely.  Kathryne Eriksson. [name], MD, [place] [phone] [facility]

## 2018-07-08 NOTE — Anesthesia Preprocedure Evaluation (Addendum)
Anesthesia Evaluation  Patient identified by MRN, date of birth, ID band Patient awake    Reviewed: Allergy & Precautions, H&P , NPO status , Patient's Chart, lab work & pertinent test results  History of Anesthesia Complications (+) PONV  Airway Mallampati: II  TM Distance: >3 FB Neck ROM: Full    Dental no notable dental hx. (+) Dental Advisory Given, Edentulous Upper, Edentulous Lower   Pulmonary asthma , sleep apnea ,    Pulmonary exam normal breath sounds clear to auscultation       Cardiovascular hypertension, + CAD and +CHF  + dysrhythmias + Cardiac Defibrillator  Rhythm:Regular Rate:Normal     Neuro/Psych Anxiety Depression negative neurological ROS     GI/Hepatic Neg liver ROS, GERD  ,  Endo/Other  diabetes, Insulin DependentHypothyroidism   Renal/GU Renal InsufficiencyRenal disease  negative genitourinary   Musculoskeletal  (+) Arthritis , Osteoarthritis,    Abdominal (+) + obese,   Peds  Hematology negative hematology ROS (+) anemia ,   Anesthesia Other Findings   Reproductive/Obstetrics negative OB ROS                            Anesthesia Physical  Anesthesia Plan  ASA: IV  Anesthesia Plan: General   Post-op Pain Management:    Induction: Intravenous, Rapid sequence and Cricoid pressure planned  PONV Risk Score and Plan: 4 or greater and Ondansetron, Midazolam and Scopolamine patch - Pre-op  Airway Management Planned: Oral ETT  Additional Equipment:   Intra-op Plan:   Post-operative Plan: Extubation in OR  Informed Consent: I have reviewed the patients History and Physical, chart, labs and discussed the procedure including the risks, benefits and alternatives for the proposed anesthesia with the patient or authorized representative who has indicated his/her understanding and acceptance.   Dental advisory given  Plan Discussed with: CRNA  Anesthesia Plan  Comments: (EKG: 06/23/18: Atrial-sensed ventricular paced rhythm. Biventricular pacing R in V1.  Cardiac cath 06/01/18:  Prox LAD lesion is 25% stenosed.  Ost 1st Diag lesion is 25% stenosed.  Mid LM lesion is 10% stenosed.  LV end diastolic pressure is mildly elevated. LVEDP 17 mm Hg.  There is no aortic valve stenosis.  Hemodynamic findings consistent with mild pulmonary hypertension.  Ao sat 98%, PA sat 66%, CO 4.79 L/min; CI 2.2, mean PA 30 mm Hg; mean PCWP 22 mm Hg No significant CAD.  Medical therapy for LV dysfunction.   Echo 05/25/18:  - Left ventricle: The cavity size was moderately dilated. Wall thickness was increased in a pattern of mild LVH. Left ventricular geometry showed evidence of eccentric hypertrophy. Systolic function was severely reduced. The estimated ejection fraction was in the range of 25% to 30%. Severe diffuse hypokinesis with no identifiable regional variations. Acoustic contrast opacification revealed no evidence ofthrombus. - Ventricular septum: Septal motion showed abnormal function, dyssynergy, and paradox. These changes are consistent with intraventricular conduction delay. - Mitral valve: Calcified annulus. There was moderate regurgitation directed centrally. The acceleration rate of the regurgitant jet was reduced, consistent with a low dP/dt. - Left atrium: The atrium was moderately dilated. - Right ventricle: Systolic function was mildly reduced. - Pericardium, extracardiac: A trivial pericardial effusion was identified. Impressions: - Compared to the 2017 study there is marked worsening of left ventricular ystolic function, in part related to marked septal-lateral wall dyssynhrony due to intraventricular conduction delay.)       Anesthesia Quick Evaluation

## 2018-07-09 ENCOUNTER — Ambulatory Visit (HOSPITAL_COMMUNITY): Payer: Medicare Other | Admitting: Vascular Surgery

## 2018-07-09 ENCOUNTER — Encounter (HOSPITAL_COMMUNITY): Payer: Self-pay | Admitting: Surgery

## 2018-07-09 ENCOUNTER — Inpatient Hospital Stay (HOSPITAL_COMMUNITY)
Admission: RE | Admit: 2018-07-09 | Discharge: 2018-07-15 | DRG: 908 | Disposition: A | Discharge status: Home Health | Payer: Medicare Other | Attending: General Surgery | Admitting: General Surgery

## 2018-07-09 ENCOUNTER — Encounter (HOSPITAL_COMMUNITY)
Admission: RE | Disposition: A | Discharge status: Home Health | Payer: Self-pay | Source: Home / Self Care | Attending: General Surgery

## 2018-07-09 ENCOUNTER — Other Ambulatory Visit: Payer: Self-pay

## 2018-07-09 DIAGNOSIS — Y92230 Patient room in hospital as the place of occurrence of the external cause: Secondary | ICD-10-CM | POA: Diagnosis not present

## 2018-07-09 DIAGNOSIS — J449 Chronic obstructive pulmonary disease, unspecified: Secondary | ICD-10-CM | POA: Diagnosis present

## 2018-07-09 DIAGNOSIS — G4733 Obstructive sleep apnea (adult) (pediatric): Secondary | ICD-10-CM | POA: Diagnosis present

## 2018-07-09 DIAGNOSIS — K632 Fistula of intestine: Secondary | ICD-10-CM | POA: Diagnosis present

## 2018-07-09 DIAGNOSIS — N183 Chronic kidney disease, stage 3 (moderate): Secondary | ICD-10-CM | POA: Diagnosis present

## 2018-07-09 DIAGNOSIS — K219 Gastro-esophageal reflux disease without esophagitis: Secondary | ICD-10-CM | POA: Diagnosis present

## 2018-07-09 DIAGNOSIS — Z9841 Cataract extraction status, right eye: Secondary | ICD-10-CM

## 2018-07-09 DIAGNOSIS — R410 Disorientation, unspecified: Secondary | ICD-10-CM | POA: Diagnosis not present

## 2018-07-09 DIAGNOSIS — E039 Hypothyroidism, unspecified: Secondary | ICD-10-CM | POA: Diagnosis present

## 2018-07-09 DIAGNOSIS — M545 Low back pain: Secondary | ICD-10-CM | POA: Diagnosis present

## 2018-07-09 DIAGNOSIS — Z9842 Cataract extraction status, left eye: Secondary | ICD-10-CM

## 2018-07-09 DIAGNOSIS — T426X5A Adverse effect of other antiepileptic and sedative-hypnotic drugs, initial encounter: Secondary | ICD-10-CM | POA: Diagnosis not present

## 2018-07-09 DIAGNOSIS — E1142 Type 2 diabetes mellitus with diabetic polyneuropathy: Secondary | ICD-10-CM | POA: Diagnosis present

## 2018-07-09 DIAGNOSIS — Z825 Family history of asthma and other chronic lower respiratory diseases: Secondary | ICD-10-CM

## 2018-07-09 DIAGNOSIS — I130 Hypertensive heart and chronic kidney disease with heart failure and stage 1 through stage 4 chronic kidney disease, or unspecified chronic kidney disease: Secondary | ICD-10-CM | POA: Diagnosis present

## 2018-07-09 DIAGNOSIS — I252 Old myocardial infarction: Secondary | ICD-10-CM

## 2018-07-09 DIAGNOSIS — Z888 Allergy status to other drugs, medicaments and biological substances status: Secondary | ICD-10-CM

## 2018-07-09 DIAGNOSIS — I428 Other cardiomyopathies: Secondary | ICD-10-CM | POA: Diagnosis present

## 2018-07-09 DIAGNOSIS — E1122 Type 2 diabetes mellitus with diabetic chronic kidney disease: Secondary | ICD-10-CM | POA: Diagnosis present

## 2018-07-09 DIAGNOSIS — R918 Other nonspecific abnormal finding of lung field: Secondary | ICD-10-CM | POA: Diagnosis not present

## 2018-07-09 DIAGNOSIS — J9811 Atelectasis: Secondary | ICD-10-CM | POA: Diagnosis not present

## 2018-07-09 DIAGNOSIS — N179 Acute kidney failure, unspecified: Secondary | ICD-10-CM | POA: Diagnosis present

## 2018-07-09 DIAGNOSIS — K565 Intestinal adhesions [bands], unspecified as to partial versus complete obstruction: Secondary | ICD-10-CM | POA: Diagnosis present

## 2018-07-09 DIAGNOSIS — Z8701 Personal history of pneumonia (recurrent): Secondary | ICD-10-CM

## 2018-07-09 DIAGNOSIS — G8929 Other chronic pain: Secondary | ICD-10-CM | POA: Diagnosis present

## 2018-07-09 DIAGNOSIS — M19041 Primary osteoarthritis, right hand: Secondary | ICD-10-CM | POA: Diagnosis present

## 2018-07-09 DIAGNOSIS — I2510 Atherosclerotic heart disease of native coronary artery without angina pectoris: Secondary | ICD-10-CM | POA: Diagnosis present

## 2018-07-09 DIAGNOSIS — Z961 Presence of intraocular lens: Secondary | ICD-10-CM | POA: Diagnosis present

## 2018-07-09 DIAGNOSIS — E785 Hyperlipidemia, unspecified: Secondary | ICD-10-CM | POA: Diagnosis present

## 2018-07-09 DIAGNOSIS — T8189XA Other complications of procedures, not elsewhere classified, initial encounter: Secondary | ICD-10-CM | POA: Diagnosis present

## 2018-07-09 DIAGNOSIS — Z9851 Tubal ligation status: Secondary | ICD-10-CM

## 2018-07-09 DIAGNOSIS — M19042 Primary osteoarthritis, left hand: Secondary | ICD-10-CM | POA: Diagnosis present

## 2018-07-09 DIAGNOSIS — R0989 Other specified symptoms and signs involving the circulatory and respiratory systems: Secondary | ICD-10-CM

## 2018-07-09 DIAGNOSIS — I255 Ischemic cardiomyopathy: Secondary | ICD-10-CM | POA: Diagnosis present

## 2018-07-09 DIAGNOSIS — I5022 Chronic systolic (congestive) heart failure: Secondary | ICD-10-CM | POA: Diagnosis present

## 2018-07-09 DIAGNOSIS — G9341 Metabolic encephalopathy: Secondary | ICD-10-CM | POA: Diagnosis not present

## 2018-07-09 DIAGNOSIS — Z803 Family history of malignant neoplasm of breast: Secondary | ICD-10-CM

## 2018-07-09 DIAGNOSIS — E669 Obesity, unspecified: Secondary | ICD-10-CM | POA: Diagnosis present

## 2018-07-09 DIAGNOSIS — M109 Gout, unspecified: Secondary | ICD-10-CM | POA: Diagnosis present

## 2018-07-09 DIAGNOSIS — Z90710 Acquired absence of both cervix and uterus: Secondary | ICD-10-CM

## 2018-07-09 DIAGNOSIS — Z9981 Dependence on supplemental oxygen: Secondary | ICD-10-CM

## 2018-07-09 DIAGNOSIS — S31109A Unspecified open wound of abdominal wall, unspecified quadrant without penetration into peritoneal cavity, initial encounter: Secondary | ICD-10-CM | POA: Diagnosis not present

## 2018-07-09 DIAGNOSIS — Z95810 Presence of automatic (implantable) cardiac defibrillator: Secondary | ICD-10-CM

## 2018-07-09 DIAGNOSIS — Z8249 Family history of ischemic heart disease and other diseases of the circulatory system: Secondary | ICD-10-CM

## 2018-07-09 DIAGNOSIS — J9611 Chronic respiratory failure with hypoxia: Secondary | ICD-10-CM | POA: Diagnosis present

## 2018-07-09 DIAGNOSIS — K660 Peritoneal adhesions (postprocedural) (postinfection): Secondary | ICD-10-CM | POA: Diagnosis not present

## 2018-07-09 DIAGNOSIS — F419 Anxiety disorder, unspecified: Secondary | ICD-10-CM | POA: Diagnosis present

## 2018-07-09 DIAGNOSIS — K5651 Intestinal adhesions [bands], with partial obstruction: Secondary | ICD-10-CM | POA: Diagnosis not present

## 2018-07-09 DIAGNOSIS — R0689 Other abnormalities of breathing: Secondary | ICD-10-CM

## 2018-07-09 DIAGNOSIS — Z6835 Body mass index (BMI) 35.0-35.9, adult: Secondary | ICD-10-CM

## 2018-07-09 DIAGNOSIS — Z87440 Personal history of urinary (tract) infections: Secondary | ICD-10-CM

## 2018-07-09 DIAGNOSIS — Z818 Family history of other mental and behavioral disorders: Secondary | ICD-10-CM

## 2018-07-09 DIAGNOSIS — Z833 Family history of diabetes mellitus: Secondary | ICD-10-CM

## 2018-07-09 HISTORY — PX: BOWEL RESECTION: SHX1257

## 2018-07-09 HISTORY — PX: SMALL INTESTINE SURGERY: SHX150

## 2018-07-09 HISTORY — PX: LYSIS OF ADHESION: SHX5961

## 2018-07-09 HISTORY — PX: EXCISION MASS ABDOMINAL: SHX6701

## 2018-07-09 HISTORY — PX: INSERTION OF MESH: SHX5868

## 2018-07-09 HISTORY — DX: Presence of cardiac pacemaker: Z95.0

## 2018-07-09 LAB — CBC WITH DIFFERENTIAL/PLATELET
Abs Immature Granulocytes: 0.04 10*3/uL (ref 0.00–0.07)
Basophils Absolute: 0.1 10*3/uL (ref 0.0–0.1)
Basophils Relative: 1 %
Eosinophils Absolute: 0.5 10*3/uL (ref 0.0–0.5)
Eosinophils Relative: 6 %
HEMATOCRIT: 42 % (ref 36.0–46.0)
Hemoglobin: 12.9 g/dL (ref 12.0–15.0)
Immature Granulocytes: 0 %
LYMPHS ABS: 3 10*3/uL (ref 0.7–4.0)
Lymphocytes Relative: 32 %
MCH: 25.3 pg — ABNORMAL LOW (ref 26.0–34.0)
MCHC: 30.7 g/dL (ref 30.0–36.0)
MCV: 82.4 fL (ref 80.0–100.0)
MONO ABS: 0.9 10*3/uL (ref 0.1–1.0)
Monocytes Relative: 10 %
Neutro Abs: 4.9 10*3/uL (ref 1.7–7.7)
Neutrophils Relative %: 51 %
Platelets: 216 10*3/uL (ref 150–400)
RBC: 5.1 MIL/uL (ref 3.87–5.11)
RDW: 14.6 % (ref 11.5–15.5)
WBC: 9.5 10*3/uL (ref 4.0–10.5)
nRBC: 0 % (ref 0.0–0.2)

## 2018-07-09 LAB — BASIC METABOLIC PANEL
Anion gap: 15 (ref 5–15)
BUN: 61 mg/dL — ABNORMAL HIGH (ref 8–23)
CO2: 24 mmol/L (ref 22–32)
Calcium: 9.5 mg/dL (ref 8.9–10.3)
Chloride: 99 mmol/L (ref 98–111)
Creatinine, Ser: 2.15 mg/dL — ABNORMAL HIGH (ref 0.44–1.00)
GFR calc Af Amer: 27 mL/min — ABNORMAL LOW (ref >60)
GFR calc non Af Amer: 23 mL/min — ABNORMAL LOW (ref >60)
GLUCOSE: 170 mg/dL — AB (ref 70–99)
Potassium: 4 mmol/L (ref 3.5–5.1)
Sodium: 138 mmol/L (ref 135–145)

## 2018-07-09 LAB — GLUCOSE, CAPILLARY
Glucose-Capillary: 165 mg/dL — ABNORMAL HIGH (ref 70–99)
Glucose-Capillary: 195 mg/dL — ABNORMAL HIGH (ref 70–99)

## 2018-07-09 SURGERY — EXCISION, MASS, TORSO
Anesthesia: General | Site: Abdomen

## 2018-07-09 MED ORDER — LIDOCAINE 2% (20 MG/ML) 5 ML SYRINGE
INTRAMUSCULAR | Status: AC
  Filled 2018-07-09: qty 5

## 2018-07-09 MED ORDER — 0.9 % SODIUM CHLORIDE (POUR BTL) OPTIME
TOPICAL | Status: DC | PRN
  Administered 2018-07-09: 1000 mL

## 2018-07-09 MED ORDER — FENTANYL CITRATE (PF) 100 MCG/2ML IJ SOLN: 25.0000 ug | INTRAMUSCULAR | Status: DC | PRN

## 2018-07-09 MED ORDER — OXYCODONE HCL 5 MG PO TABS
5.0000 mg | ORAL_TABLET | Freq: Once | ORAL | Status: AC | PRN
  Administered 2018-07-09: 5 mg via ORAL

## 2018-07-09 MED ORDER — LIDOCAINE 2% (20 MG/ML) 5 ML SYRINGE
INTRAMUSCULAR | Status: DC | PRN
  Administered 2018-07-09: 60 mg via INTRAVENOUS

## 2018-07-09 MED ORDER — ACETAMINOPHEN 325 MG PO TABS: 325.0000 mg | ORAL_TABLET | ORAL | Status: DC | PRN

## 2018-07-09 MED ORDER — HYDROMORPHONE HCL 1 MG/ML IJ SOLN
0.5000 mg | INTRAMUSCULAR | Status: DC | PRN
  Administered 2018-07-09 (×2): 0.5 mg via INTRAVENOUS
  Filled 2018-07-09 (×2): qty 1

## 2018-07-09 MED ORDER — GABAPENTIN 800 MG PO TABS
800.0000 mg | ORAL_TABLET | Freq: Every day | ORAL | Status: DC
  Filled 2018-07-09: qty 1

## 2018-07-09 MED ORDER — FENTANYL CITRATE (PF) 250 MCG/5ML IJ SOLN
INTRAMUSCULAR | Status: AC
  Filled 2018-07-09: qty 5

## 2018-07-09 MED ORDER — ROCURONIUM BROMIDE 50 MG/5ML IV SOSY
PREFILLED_SYRINGE | INTRAVENOUS | Status: AC
  Filled 2018-07-09: qty 5

## 2018-07-09 MED ORDER — ALBUTEROL SULFATE (2.5 MG/3ML) 0.083% IN NEBU: 2.5000 mg | INHALATION_SOLUTION | Freq: Four times a day (QID) | RESPIRATORY_TRACT | Status: DC | PRN

## 2018-07-09 MED ORDER — ONDANSETRON HCL 4 MG/2ML IJ SOLN
INTRAMUSCULAR | Status: AC
  Filled 2018-07-09: qty 2

## 2018-07-09 MED ORDER — CHLORHEXIDINE GLUCONATE CLOTH 2 % EX PADS: 6.0000 | MEDICATED_PAD | Freq: Once | CUTANEOUS | Status: DC

## 2018-07-09 MED ORDER — ENOXAPARIN SODIUM 30 MG/0.3ML ~~LOC~~ SOLN
30.0000 mg | SUBCUTANEOUS | Status: DC
  Administered 2018-07-10 – 2018-07-14 (×5): 30 mg via SUBCUTANEOUS
  Filled 2018-07-09 (×5): qty 0.3

## 2018-07-09 MED ORDER — GABAPENTIN 400 MG PO CAPS
800.0000 mg | ORAL_CAPSULE | Freq: Every day | ORAL | Status: DC
  Administered 2018-07-09 – 2018-07-11 (×3): 800 mg via ORAL
  Filled 2018-07-09 (×3): qty 2

## 2018-07-09 MED ORDER — SACUBITRIL-VALSARTAN 24-26 MG PO TABS
1.0000 | ORAL_TABLET | Freq: Two times a day (BID) | ORAL | Status: DC
  Administered 2018-07-10 – 2018-07-11 (×2): 1 via ORAL
  Filled 2018-07-09 (×4): qty 1

## 2018-07-09 MED ORDER — CEFAZOLIN SODIUM-DEXTROSE 2-4 GM/100ML-% IV SOLN
2.0000 g | Freq: Three times a day (TID) | INTRAVENOUS | Status: AC
  Administered 2018-07-09: 2 g via INTRAVENOUS
  Filled 2018-07-09: qty 100

## 2018-07-09 MED ORDER — ACETAMINOPHEN 160 MG/5ML PO SOLN: 325.0000 mg | ORAL | Status: DC | PRN

## 2018-07-09 MED ORDER — FENTANYL CITRATE (PF) 250 MCG/5ML IJ SOLN
INTRAMUSCULAR | Status: DC | PRN
  Administered 2018-07-09: 50 ug via INTRAVENOUS
  Administered 2018-07-09 (×2): 25 ug via INTRAVENOUS
  Administered 2018-07-09: 50 ug via INTRAVENOUS
  Administered 2018-07-09 (×2): 25 ug via INTRAVENOUS

## 2018-07-09 MED ORDER — OXYCODONE HCL 5 MG/5ML PO SOLN: 5.0000 mg | Freq: Once | ORAL | Status: AC | PRN

## 2018-07-09 MED ORDER — BUPIVACAINE-EPINEPHRINE (PF) 0.25% -1:200000 IJ SOLN
INTRAMUSCULAR | Status: DC | PRN
  Administered 2018-07-09: 20 mL

## 2018-07-09 MED ORDER — CEFAZOLIN SODIUM-DEXTROSE 2-4 GM/100ML-% IV SOLN
2.0000 g | INTRAVENOUS | Status: AC
  Administered 2018-07-09: 2 g via INTRAVENOUS
  Filled 2018-07-09: qty 100

## 2018-07-09 MED ORDER — MEPERIDINE HCL 50 MG/ML IJ SOLN: 6.2500 mg | INTRAMUSCULAR | Status: DC | PRN

## 2018-07-09 MED ORDER — PHENYLEPHRINE 40 MCG/ML (10ML) SYRINGE FOR IV PUSH (FOR BLOOD PRESSURE SUPPORT)
PREFILLED_SYRINGE | INTRAVENOUS | Status: AC
  Filled 2018-07-09: qty 10

## 2018-07-09 MED ORDER — BUPIVACAINE-EPINEPHRINE (PF) 0.25% -1:200000 IJ SOLN
INTRAMUSCULAR | Status: AC
  Filled 2018-07-09: qty 30

## 2018-07-09 MED ORDER — POTASSIUM CHLORIDE CRYS ER 20 MEQ PO TBCR
20.0000 meq | EXTENDED_RELEASE_TABLET | Freq: Every day | ORAL | Status: DC
  Administered 2018-07-09 – 2018-07-11 (×3): 20 meq via ORAL
  Filled 2018-07-09 (×3): qty 1

## 2018-07-09 MED ORDER — ROSUVASTATIN CALCIUM 5 MG PO TABS
10.0000 mg | ORAL_TABLET | Freq: Every day | ORAL | Status: DC
  Administered 2018-07-09 – 2018-07-14 (×6): 10 mg via ORAL
  Filled 2018-07-09 (×6): qty 2

## 2018-07-09 MED ORDER — POVIDONE-IODINE 10 % EX OINT
TOPICAL_OINTMENT | CUTANEOUS | Status: DC | PRN
  Administered 2018-07-09: 1 via TOPICAL

## 2018-07-09 MED ORDER — PROPOFOL 10 MG/ML IV BOLUS
INTRAVENOUS | Status: DC | PRN
  Administered 2018-07-09: 80 mg via INTRAVENOUS

## 2018-07-09 MED ORDER — ALBUTEROL SULFATE HFA 108 (90 BASE) MCG/ACT IN AERS: 1.0000 | INHALATION_SPRAY | Freq: Four times a day (QID) | RESPIRATORY_TRACT | Status: DC | PRN

## 2018-07-09 MED ORDER — GABAPENTIN 100 MG PO CAPS
100.0000 mg | ORAL_CAPSULE | Freq: Every morning | ORAL | Status: DC
  Administered 2018-07-10 – 2018-07-12 (×3): 100 mg via ORAL
  Filled 2018-07-09 (×3): qty 1

## 2018-07-09 MED ORDER — CARVEDILOL 12.5 MG PO TABS
12.5000 mg | ORAL_TABLET | Freq: Two times a day (BID) | ORAL | Status: DC
  Administered 2018-07-09 – 2018-07-15 (×12): 12.5 mg via ORAL
  Filled 2018-07-09 (×12): qty 1

## 2018-07-09 MED ORDER — OXYCODONE HCL 5 MG PO TABS
ORAL_TABLET | ORAL | Status: AC
  Filled 2018-07-09: qty 1

## 2018-07-09 MED ORDER — ALLOPURINOL 100 MG PO TABS
100.0000 mg | ORAL_TABLET | Freq: Every day | ORAL | Status: DC
  Administered 2018-07-10 – 2018-07-15 (×6): 100 mg via ORAL
  Filled 2018-07-09 (×6): qty 1

## 2018-07-09 MED ORDER — LACTATED RINGERS IV SOLN
INTRAVENOUS | Status: DC | PRN
  Administered 2018-07-09 (×2): via INTRAVENOUS

## 2018-07-09 MED ORDER — PHENYLEPHRINE 40 MCG/ML (10ML) SYRINGE FOR IV PUSH (FOR BLOOD PRESSURE SUPPORT)
PREFILLED_SYRINGE | INTRAVENOUS | Status: DC | PRN
  Administered 2018-07-09: 80 ug via INTRAVENOUS
  Administered 2018-07-09 (×2): 120 ug via INTRAVENOUS
  Administered 2018-07-09: 80 ug via INTRAVENOUS

## 2018-07-09 MED ORDER — SCOPOLAMINE 1 MG/3DAYS TD PT72
MEDICATED_PATCH | TRANSDERMAL | Status: AC
  Filled 2018-07-09: qty 1

## 2018-07-09 MED ORDER — SUGAMMADEX SODIUM 200 MG/2ML IV SOLN
INTRAVENOUS | Status: DC | PRN
  Administered 2018-07-09 (×2): 200 mg via INTRAVENOUS

## 2018-07-09 MED ORDER — LEVOTHYROXINE SODIUM 88 MCG PO TABS
88.0000 ug | ORAL_TABLET | Freq: Every day | ORAL | Status: DC
  Administered 2018-07-10 – 2018-07-15 (×6): 88 ug via ORAL
  Filled 2018-07-09 (×6): qty 1

## 2018-07-09 MED ORDER — POVIDONE-IODINE 10 % EX OINT
TOPICAL_OINTMENT | CUTANEOUS | Status: AC
  Filled 2018-07-09: qty 28.35

## 2018-07-09 MED ORDER — SODIUM CHLORIDE 0.9 % IV SOLN
INTRAVENOUS | Status: DC
  Administered 2018-07-09 – 2018-07-10 (×2): via INTRAVENOUS

## 2018-07-09 MED ORDER — MIDAZOLAM HCL 2 MG/2ML IJ SOLN
INTRAMUSCULAR | Status: AC
  Filled 2018-07-09: qty 2

## 2018-07-09 MED ORDER — SODIUM CHLORIDE 0.9 % IV SOLN
INTRAVENOUS | Status: DC | PRN
  Administered 2018-07-09: 50 ug/min via INTRAVENOUS

## 2018-07-09 MED ORDER — TEMAZEPAM 15 MG PO CAPS: 15.0000 mg | ORAL_CAPSULE | Freq: Every evening | ORAL | Status: DC | PRN

## 2018-07-09 MED ORDER — ONDANSETRON HCL 4 MG/2ML IJ SOLN
INTRAMUSCULAR | Status: DC | PRN
  Administered 2018-07-09: 4 mg via INTRAVENOUS

## 2018-07-09 MED ORDER — TORSEMIDE 20 MG PO TABS
20.0000 mg | ORAL_TABLET | Freq: Every day | ORAL | Status: DC
  Administered 2018-07-09 – 2018-07-11 (×3): 20 mg via ORAL
  Filled 2018-07-09 (×3): qty 1

## 2018-07-09 MED ORDER — CALCIUM CARBONATE-VITAMIN D 600-400 MG-UNIT PO TABS: 1.0000 | ORAL_TABLET | Freq: Two times a day (BID) | ORAL | Status: DC

## 2018-07-09 MED ORDER — MONTELUKAST SODIUM 10 MG PO TABS
10.0000 mg | ORAL_TABLET | Freq: Every day | ORAL | Status: DC
  Administered 2018-07-09 – 2018-07-14 (×6): 10 mg via ORAL
  Filled 2018-07-09 (×6): qty 1

## 2018-07-09 MED ORDER — DULOXETINE HCL 30 MG PO CPEP
30.0000 mg | ORAL_CAPSULE | Freq: Every day | ORAL | Status: DC
  Administered 2018-07-10 – 2018-07-15 (×6): 30 mg via ORAL
  Filled 2018-07-09 (×6): qty 1

## 2018-07-09 MED ORDER — ROCURONIUM BROMIDE 10 MG/ML (PF) SYRINGE
PREFILLED_SYRINGE | INTRAVENOUS | Status: DC | PRN
  Administered 2018-07-09: 10 mg via INTRAVENOUS
  Administered 2018-07-09: 20 mg via INTRAVENOUS
  Administered 2018-07-09 (×2): 10 mg via INTRAVENOUS
  Administered 2018-07-09: 30 mg via INTRAVENOUS

## 2018-07-09 MED ORDER — MIDAZOLAM HCL 5 MG/5ML IJ SOLN
INTRAMUSCULAR | Status: DC | PRN
  Administered 2018-07-09 (×2): 1 mg via INTRAVENOUS

## 2018-07-09 MED ORDER — CALCIUM CARBONATE-VITAMIN D 500-200 MG-UNIT PO TABS
1.0000 | ORAL_TABLET | Freq: Two times a day (BID) | ORAL | Status: DC
  Administered 2018-07-09 – 2018-07-15 (×12): 1 via ORAL
  Filled 2018-07-09 (×12): qty 1

## 2018-07-09 MED ORDER — PANTOPRAZOLE SODIUM 40 MG PO TBEC
40.0000 mg | DELAYED_RELEASE_TABLET | Freq: Every evening | ORAL | Status: DC
  Administered 2018-07-09 – 2018-07-14 (×6): 40 mg via ORAL
  Filled 2018-07-09 (×6): qty 1

## 2018-07-09 MED ORDER — SCOPOLAMINE 1 MG/3DAYS TD PT72
MEDICATED_PATCH | TRANSDERMAL | Status: DC | PRN
  Administered 2018-07-09: 1 via TRANSDERMAL

## 2018-07-09 MED ORDER — PROPOFOL 10 MG/ML IV BOLUS
INTRAVENOUS | Status: AC
  Filled 2018-07-09: qty 20

## 2018-07-09 SURGICAL SUPPLY — 43 items
BINDER ABDOMINAL 12 ML 46-62 (SOFTGOODS) ×3 IMPLANT
CANISTER SUCT 3000ML PPV (MISCELLANEOUS) ×3 IMPLANT
CHLORAPREP W/TINT 26ML (MISCELLANEOUS) ×3 IMPLANT
CLEANER TIP ELECTROSURG 2X2 (MISCELLANEOUS) ×3 IMPLANT
COVER SURGICAL LIGHT HANDLE (MISCELLANEOUS) ×3 IMPLANT
DRAPE LAPAROTOMY T 102X78X121 (DRAPES) ×3 IMPLANT
ELECT REM PT RETURN 9FT ADLT (ELECTROSURGICAL) ×3
ELECTRODE REM PT RTRN 9FT ADLT (ELECTROSURGICAL) ×1 IMPLANT
GAUZE SPONGE 4X4 12PLY STRL (GAUZE/BANDAGES/DRESSINGS) ×3 IMPLANT
GLOVE BIO SURGEON STRL SZ8 (GLOVE) ×3 IMPLANT
GLOVE BIOGEL PI IND STRL 6.5 (GLOVE) ×2 IMPLANT
GLOVE BIOGEL PI IND STRL 8 (GLOVE) ×2 IMPLANT
GLOVE BIOGEL PI INDICATOR 6.5 (GLOVE) ×4
GLOVE BIOGEL PI INDICATOR 8 (GLOVE) ×4
GLOVE ECLIPSE 7.5 STRL STRAW (GLOVE) ×3 IMPLANT
GLOVE SURG SS PI 6.5 STRL IVOR (GLOVE) ×12 IMPLANT
GOWN STRL REUS W/ TWL LRG LVL3 (GOWN DISPOSABLE) ×3 IMPLANT
GOWN STRL REUS W/TWL LRG LVL3 (GOWN DISPOSABLE) ×6
KIT BASIN OR (CUSTOM PROCEDURE TRAY) ×3 IMPLANT
KIT TURNOVER KIT B (KITS) ×3 IMPLANT
MESH VICRYL KNITTED 12X12 (Mesh General) ×3 IMPLANT
NEEDLE HYPO 25GX1X1/2 BEV (NEEDLE) ×3 IMPLANT
NS IRRIG 1000ML POUR BTL (IV SOLUTION) ×3 IMPLANT
PACK GENERAL/GYN (CUSTOM PROCEDURE TRAY) ×3 IMPLANT
PAD ABD 8X10 STRL (GAUZE/BANDAGES/DRESSINGS) ×3 IMPLANT
PAD ARMBOARD 7.5X6 YLW CONV (MISCELLANEOUS) ×6 IMPLANT
RELOAD LINEAR CUT PROX 55 BLUE (ENDOMECHANICALS) ×6 IMPLANT
SPECIMEN JAR SMALL (MISCELLANEOUS) ×3 IMPLANT
SPONGE LAP 18X18 RF (DISPOSABLE) ×3 IMPLANT
STAPLER GUN LINEAR PROX 60 (STAPLE) ×3 IMPLANT
STAPLER PROXIMATE 55 BLUE (STAPLE) ×3 IMPLANT
SUT ETHILON 2 0 FS 18 (SUTURE) ×9 IMPLANT
SUT MNCRL AB 4-0 PS2 18 (SUTURE) IMPLANT
SUT SILK 2 0 TIES 10X30 (SUTURE) ×3 IMPLANT
SUT SILK 3 0 SH CR/8 (SUTURE) ×6 IMPLANT
SUT VIC AB 0 CT1 18XCR BRD 8 (SUTURE) ×3 IMPLANT
SUT VIC AB 0 CT1 8-18 (SUTURE) ×6
SUT VIC AB 3-0 SH 18 (SUTURE) ×3 IMPLANT
SUT VIC AB 3-0 SH 27 (SUTURE)
SUT VIC AB 3-0 SH 27X BRD (SUTURE) IMPLANT
SYR BULB 3OZ (MISCELLANEOUS) ×3 IMPLANT
SYR CONTROL 10ML LL (SYRINGE) ×3 IMPLANT
YANKAUER SUCT BULB TIP NO VENT (SUCTIONS) ×3 IMPLANT

## 2018-07-09 NOTE — Transfer of Care (Signed)
Immediate Anesthesia Transfer of Care Note  Patient: Crystal [name]  Procedure(s) Performed: EXPLORATION OF  ABDOMINAL WOUND ERAS PATHWAY (N/A Abdomen) SMALL BOWEL RESECTION (N/A Abdomen) INSERTION OF VICRYL MESH (N/A Abdomen) LYSIS OF ADHESION (N/A Abdomen)  Patient Location: PACU  Anesthesia Type:General  Level of Consciousness: awake, lethargic, responds to stimulation and Patient remains intubated per anesthesia plan  Airway & Oxygen Therapy: Patient remains intubated per anesthesia plan and Patient placed on Ventilator (see vital sign flow sheet for setting)  Post-op Assessment: Report given to RN and Post -op Vital signs reviewed and stable  Post vital signs: Reviewed and stable  Last Vitals:  Vitals Value Taken Time  BP 117/49 07/09/2018 12:27 PM  Temp    Pulse 83 07/09/2018 12:28 PM  Resp 12 07/09/2018 12:28 PM  SpO2 95 % 07/09/2018 12:28 PM  Vitals shown include unvalidated device data.  Last Pain:  Vitals:   07/09/18 0735  TempSrc:   PainSc: 0-No pain      Patients Stated Pain Goal: 1 (20/80/22 3361)  Complications: No apparent anesthesia complications

## 2018-07-09 NOTE — Op Note (Signed)
OPERATIVE REPORT  DATE OF OPERATION: 07/09/2018  PATIENT:  Crystal [name]  67 y.o. 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  Significant small bowel attachment to the abdominal wall and a large midline hernia defect.  PROCEDURE:  Procedure(s): EXPLORATION OF  ABDOMINAL WOUND ERAS PATHWAY SMALL BOWEL RESECTION INSERTION OF VICRYL MESH LYSIS OF ADHESION  SURGEON:  Surgeon(s): Judeth Horn, MD Georganna Skeans, MD  ASSISTANT: Dr. Georganna Skeans  ANESTHESIA:   general  COMPLICATIONS: None  EBL: 150 ml  BLOOD ADMINISTERED: none  DRAINS: none   SPECIMEN:  Source of Specimen:  Resected small bowel  COUNTS CORRECT:  YES  PROCEDURE DETAILS: The patient was taken to the operating room and placed on the table in supine position.  After an adequate general endotracheal anesthetic was administered and a magnet had been placed on her defibrillator, she was prepped and draped in the usual sterile manner exposing her abdomen.  A timeout was performed identifying the patient and procedure to be performed.  We started with the midline incision through the previous midline incision above the nonhealing wound area which is approximately 3-1/2 cm in diameter.  We took it around the nonhealing area with a #10 blade blade.  We then dissected down around this area using electrocautery down to the subcutaneous tissue.  It turned out that there was a loop of small bowel that was attached to the skin and there was no way to separate the small bowel  from the abdominal wall.  This was taken down but it was imossible not to resect a small segment of small bowel which we subsequently did after we perform a significant amount of enterolysis.  As we  went into the peritoneal cavity we did make a small bowel enterotomy at the site where the small bowel was attached to the abdominal wall.  We also detached the small bowel from surrounding other small bowel.  We put noncrushing small bowel clamps on each and as we dissected out the small bowel lumen subsequently dissected free enough to perform a side-to-side anastomosis between the loops of resected small bowel using a GIA 55 stapler.  Approximately 4 to 5 inches of small bowel was resected.  The mesentery was closed with 3-0 sutures.  And the resulting enterotomy was closed using a TX 60 stapler.  The staple line of the anastomosis was reinforced using 3-0 silk Lambert stitches.  We subsequently ran the small bowel proximally and distally as we thought we would be able to just close the abdominal wall; however, the patient had no remaining fascia to close.  There were other loops of small bowel that were attached to that abdominal wall that we carefully took down and one small bowel enterotomy was made which was repaired with 3-0 silk sutures.  Other serosal tears were also repaired with 3-0 silk sutures.  We were able to loosen up enough small bowel in order to get back into deep and up into the peritoneal cavity we subsequently used a sheet of Vicryl mesh in order to cover the bowel and attach it to the underlying deep subcutaneous tissue over what ever fascial layer was left.  We attached using interrupted 0 Vicryl sutures.  Once this was done the remaining scar tissue and subcutaneous tissue was  reapproximated using interrupted 0 Vicryl sutures.  The skin was closed using horizontal mattress sutures of 2-0 nylon.  Space was left between the sutures to allow drainage.  As I do expect this wound to drain.  We injected 20 mL of 0.25% Marcaine into the subcutaneous tissue.  A sterile dressing was then applied.  The needle counts, sponge counts and instrument counts were correct.   PATIENT DISPOSITION:   PACU - guarded condition.   Judeth Horn 12/16/201911:51 AM

## 2018-07-09 NOTE — Anesthesia Postprocedure Evaluation (Signed)
Anesthesia Post Note  Patient: Crystal [name]  Procedure(s) Performed: EXPLORATION OF  ABDOMINAL WOUND ERAS PATHWAY (N/A Abdomen) SMALL BOWEL RESECTION (N/A Abdomen) INSERTION OF VICRYL MESH (N/A Abdomen) LYSIS OF ADHESION (N/A Abdomen)     Patient location during evaluation: PACU Anesthesia Type: General Level of consciousness: awake and alert Pain management: pain level controlled Vital Signs Assessment: post-procedure vital signs reviewed and stable Respiratory status: spontaneous breathing, nonlabored ventilation, respiratory function stable and patient connected to nasal cannula oxygen Cardiovascular status: blood pressure returned to baseline and stable Postop Assessment: no apparent nausea or vomiting Anesthetic complications: no    Last Vitals:  Vitals:   07/09/18 1326 07/09/18 1342  BP: (!) 100/45 (!) 96/53  Pulse: 73   Resp: 13   Temp:    SpO2: 93%     Last Pain:  Vitals:   07/09/18 1342  TempSrc:   PainSc: 4                  Fread Kottke

## 2018-07-09 NOTE — Procedures (Signed)
Extubation Procedure Note  Patient Details:   Name: Crystal [name] DOB: [birth year] MRN: [id number]   Airway Documentation:    Vent end date: 07/09/18 Vent end time: 1250   Evaluation  O2 sats: stable throughout Complications: No apparent complications Patient did tolerate procedure well. Bilateral Breath Sounds: Rhonchi   Yes   Patient extubated to 4L nasal cannula.  Positive cuff leak noted.  No evidence of stridor.  Patient able to speak post extubation.  Sats currently 94%.  Vitals are stable.  No complications noted.   Philomena Doheny 07/09/2018, 12:55 PM

## 2018-07-09 NOTE — Progress Notes (Signed)
Dr. Ambrose Pancoast made aware of low diastolic BPs in the 49P. Systolic pressures 53-005R. MD okay with transfer to floor if systolic pressures maintain within normal limits.

## 2018-07-09 NOTE — Progress Notes (Signed)
RT called to PACU due to patient needing ventilator due to unable to get extubated in OR.  Patient hooked to ventilator on CPAP/PSV of 10/5 and FIO2 of 40%.  Patient currently tolerating well.  Will continue to monitor.

## 2018-07-09 NOTE — Anesthesia Procedure Notes (Signed)
Procedure Name: Intubation Date/Time: 07/09/2018 9:40 AM Performed by: Harden Mo, CRNA Pre-anesthesia Checklist: Patient identified, Emergency Drugs available, Suction available and Patient being monitored Patient Re-evaluated:Patient Re-evaluated prior to induction Oxygen Delivery Method: Circle System Utilized Preoxygenation: Pre-oxygenation with 100% oxygen Induction Type: IV induction Ventilation: Mask ventilation without difficulty and Oral airway inserted - appropriate to patient size Laryngoscope Size: Sabra Heck and 2 Grade View: Grade I Tube type: Oral Tube size: 7.5 mm Number of attempts: 1 Airway Equipment and Method: Stylet and Oral airway Placement Confirmation: ETT inserted through vocal cords under direct vision,  positive ETCO2 and breath sounds checked- equal and bilateral Secured at: 22 cm Tube secured with: Tape Dental Injury: Teeth and Oropharynx as per pre-operative assessment

## 2018-07-10 ENCOUNTER — Encounter (HOSPITAL_COMMUNITY): Payer: Self-pay | Admitting: General Surgery

## 2018-07-10 LAB — CBC
HCT: 41.3 % (ref 36.0–46.0)
Hemoglobin: 12 g/dL (ref 12.0–15.0)
MCH: 24.7 pg — AB (ref 26.0–34.0)
MCHC: 29.1 g/dL — ABNORMAL LOW (ref 30.0–36.0)
MCV: 85 fL (ref 80.0–100.0)
Platelets: 210 10*3/uL (ref 150–400)
RBC: 4.86 MIL/uL (ref 3.87–5.11)
RDW: 14.9 % (ref 11.5–15.5)
WBC: 15.1 10*3/uL — ABNORMAL HIGH (ref 4.0–10.5)
nRBC: 0 % (ref 0.0–0.2)

## 2018-07-10 LAB — BASIC METABOLIC PANEL
Anion gap: 12 (ref 5–15)
BUN: 49 mg/dL — AB (ref 8–23)
CO2: 27 mmol/L (ref 22–32)
Calcium: 8.7 mg/dL — ABNORMAL LOW (ref 8.9–10.3)
Chloride: 98 mmol/L (ref 98–111)
Creatinine, Ser: 2.04 mg/dL — ABNORMAL HIGH (ref 0.44–1.00)
GFR calc Af Amer: 28 mL/min — ABNORMAL LOW (ref >60)
GFR calc non Af Amer: 24 mL/min — ABNORMAL LOW (ref >60)
Glucose, Bld: 208 mg/dL — ABNORMAL HIGH (ref 70–99)
Potassium: 4.6 mmol/L (ref 3.5–5.1)
Sodium: 137 mmol/L (ref 135–145)

## 2018-07-10 LAB — GLUCOSE, CAPILLARY
Glucose-Capillary: 177 mg/dL — ABNORMAL HIGH (ref 70–99)
Glucose-Capillary: 204 mg/dL — ABNORMAL HIGH (ref 70–99)
Glucose-Capillary: 133 mg/dL — ABNORMAL HIGH (ref 70–99)
Glucose-Capillary: 146 mg/dL — ABNORMAL HIGH (ref 70–99)

## 2018-07-10 LAB — HEMOGLOBIN A1C
Hgb A1c MFr Bld: 10.4 % — ABNORMAL HIGH (ref 4.8–5.6)
Mean Plasma Glucose: 251.78 mg/dL

## 2018-07-10 MED ORDER — INSULIN ASPART 100 UNIT/ML ~~LOC~~ SOLN
0.0000 [IU] | Freq: Three times a day (TID) | SUBCUTANEOUS | Status: DC
  Administered 2018-07-10: 3 [IU] via SUBCUTANEOUS
  Administered 2018-07-10: 7 [IU] via SUBCUTANEOUS
  Administered 2018-07-10: 3 [IU] via SUBCUTANEOUS
  Administered 2018-07-11 – 2018-07-13 (×6): 4 [IU] via SUBCUTANEOUS
  Administered 2018-07-13 – 2018-07-14 (×2): 7 [IU] via SUBCUTANEOUS
  Administered 2018-07-14: 3 [IU] via SUBCUTANEOUS
  Administered 2018-07-14: 4 [IU] via SUBCUTANEOUS
  Administered 2018-07-15: 11 [IU] via SUBCUTANEOUS
  Administered 2018-07-15: 4 [IU] via SUBCUTANEOUS

## 2018-07-10 MED ORDER — HYDROMORPHONE HCL 1 MG/ML IJ SOLN: 0.5000 mg | INTRAMUSCULAR | Status: DC | PRN

## 2018-07-10 MED ORDER — INSULIN ASPART 100 UNIT/ML ~~LOC~~ SOLN: 0.0000 [IU] | Freq: Every day | SUBCUTANEOUS | Status: DC

## 2018-07-10 MED ORDER — INSULIN DETEMIR 100 UNIT/ML ~~LOC~~ SOLN
10.0000 [IU] | Freq: Two times a day (BID) | SUBCUTANEOUS | Status: DC
  Administered 2018-07-10 – 2018-07-15 (×11): 10 [IU] via SUBCUTANEOUS
  Filled 2018-07-10 (×11): qty 0.1

## 2018-07-10 MED ORDER — INSULIN ASPART 100 UNIT/ML ~~LOC~~ SOLN
4.0000 [IU] | Freq: Three times a day (TID) | SUBCUTANEOUS | Status: DC
  Administered 2018-07-10 – 2018-07-15 (×16): 4 [IU] via SUBCUTANEOUS

## 2018-07-10 MED ORDER — OXYCODONE HCL 5 MG PO TABS: 5.0000 mg | ORAL_TABLET | ORAL | Status: DC | PRN

## 2018-07-10 MED ORDER — LIDOCAINE-EPINEPHRINE 1 %-1:100000 IJ SOLN
INTRAMUSCULAR | Status: AC
  Filled 2018-07-10: qty 1

## 2018-07-10 MED ORDER — OXYCODONE HCL 5 MG PO TABS
5.0000 mg | ORAL_TABLET | ORAL | Status: DC | PRN
  Administered 2018-07-10: 5 mg via ORAL
  Filled 2018-07-10: qty 1

## 2018-07-10 NOTE — Progress Notes (Signed)
CCS/Angele Wiemann Progress Note 1 Day Post-Op  Subjective: Patient doing well and has voided several times overnight.  No distress.  Also has passed some gase  Objective: Vital signs in last 24 hours: Temp:  [97.9 F (36.6 C)-99.8 F (37.7 C)] 98.6 F (37 C) (12/17 0604) Pulse Rate:  [73-97] 96 (12/17 0604) Resp:  [13-18] 18 (12/16 2203) BP: (93-131)/(38-65) 101/38 (12/17 0604) SpO2:  [89 %-100 %] 100 % (12/17 0604) FiO2 (%):  [40 %] 40 % (12/16 1242) Last BM Date: 07/09/18  Intake/Output from previous day: 12/16 0701 - 12/17 0700 In: 1867.4 [I.V.:1867.4] Out: 50 [Blood:50] Intake/Output this shift: No intake/output data recorded.  General: No acute distress  Lungs: Clear to auscultation.  Abd: Soft, mildly distended, excellent bowel sunds.  Wound was uncovered and is not draining.  Will redress the wound  Extremities: No clinical signs or symptoms of DVT  Neuro: Intact  Lab Results:  @LABLAST2 (wbc:2,hgb:2,hct:2,plt:2) BMET ) Recent Labs    07/09/18 0711 07/10/18 0150  NA 138 137  K 4.0 4.6  CL 99 98  CO2 24 27  GLUCOSE 170* 208*  BUN 61* 49*  CREATININE 2.15* 2.04*  CALCIUM 9.5 8.7*   PT/INR No results for input(s): LABPROT, INR in the last 72 hours. ABG No results for input(s): PHART, HCO3 in the last 72 hours.  Invalid input(s): PCO2, PO2  Studies/Results: No results found.  Anti-infectives: Anti-infectives (From admission, onward)   Start     Dose/Rate Route Frequency Ordered Stop   07/09/18 1730  ceFAZolin (ANCEF) IVPB 2g/100 mL premix     2 g 200 mL/hr over 30 Minutes Intravenous Every 8 hours 07/09/18 1424 07/09/18 1859   07/09/18 0645  ceFAZolin (ANCEF) IVPB 2g/100 mL premix     2 g 200 mL/hr over 30 Minutes Intravenous On call to O.R. 07/09/18 0644 07/09/18 0950      Assessment/Plan: s/p Procedure(s): EXPLORATION OF  ABDOMINAL WOUND ERAS PATHWAY SMALL BOWEL RESECTION INSERTION OF VICRYL MESH LYSIS OF ADHESION Advance diet  LOS: 1 day    Kathryne Eriksson. [name], MD, FACS [phone] [facility] 07/10/2018

## 2018-07-10 NOTE — Social Work (Signed)
CSW acknowledging consult for access to medications at discharge.  For medication access please consult RN Case Management.   CSW signing off. Please consult if any additional needs arise.  Salle Brandle, MSW, LCSWA [place] [facility] [phone]     

## 2018-07-10 NOTE — Plan of Care (Signed)

## 2018-07-10 NOTE — Plan of Care (Signed)
  Problem: Education: Goal: Knowledge of General Education information will improve Description Including pain rating scale, medication(s)/side effects and non-pharmacologic comfort measures Outcome: Progressing   Problem: Health Behavior/Discharge Planning: Goal: Ability to manage health-related needs will improve Outcome: Progressing   Problem: Clinical Measurements: Goal: Ability to maintain clinical measurements within normal limits will improve Outcome: Progressing Goal: Will remain free from infection Outcome: Progressing   Problem: Activity: Goal: Risk for activity intolerance will decrease Outcome: Progressing   Problem: Nutrition: Goal: Adequate nutrition will be maintained Outcome: Progressing   Problem: Coping: Goal: Level of anxiety will decrease Outcome: Progressing   Problem: Elimination: Goal: Will not experience complications related to urinary retention Outcome: Progressing   Problem: Pain Managment: Goal: General experience of comfort will improve Outcome: Progressing   Problem: Safety: Goal: Ability to remain free from injury will improve Outcome: Progressing   Problem: Skin Integrity: Goal: Risk for impaired skin integrity will decrease Outcome: Progressing   

## 2018-07-11 LAB — GLUCOSE, CAPILLARY
Glucose-Capillary: 113 mg/dL — ABNORMAL HIGH (ref 70–99)
Glucose-Capillary: 159 mg/dL — ABNORMAL HIGH (ref 70–99)
Glucose-Capillary: 198 mg/dL — ABNORMAL HIGH (ref 70–99)
Glucose-Capillary: 193 mg/dL — ABNORMAL HIGH (ref 70–99)

## 2018-07-11 LAB — BASIC METABOLIC PANEL
Anion gap: 11 (ref 5–15)
BUN: 54 mg/dL — ABNORMAL HIGH (ref 8–23)
CO2: 23 mmol/L (ref 22–32)
Calcium: 8.4 mg/dL — ABNORMAL LOW (ref 8.9–10.3)
Chloride: 98 mmol/L (ref 98–111)
Creatinine, Ser: 2.77 mg/dL — ABNORMAL HIGH (ref 0.44–1.00)
GFR calc Af Amer: 20 mL/min — ABNORMAL LOW (ref >60)
GFR calc non Af Amer: 17 mL/min — ABNORMAL LOW (ref >60)
GLUCOSE: 130 mg/dL — AB (ref 70–99)
Potassium: 5.2 mmol/L — ABNORMAL HIGH (ref 3.5–5.1)
Sodium: 132 mmol/L — ABNORMAL LOW (ref 135–145)

## 2018-07-11 LAB — CBC
HCT: 34.5 % — ABNORMAL LOW (ref 36.0–46.0)
Hemoglobin: 10.7 g/dL — ABNORMAL LOW (ref 12.0–15.0)
MCH: 26.4 pg (ref 26.0–34.0)
MCHC: 31 g/dL (ref 30.0–36.0)
MCV: 85.2 fL (ref 80.0–100.0)
Platelets: 125 10*3/uL — ABNORMAL LOW (ref 150–400)
RBC: 4.05 MIL/uL (ref 3.87–5.11)
RDW: 15.3 % (ref 11.5–15.5)
WBC: 12.8 10*3/uL — ABNORMAL HIGH (ref 4.0–10.5)
nRBC: 0 % (ref 0.0–0.2)

## 2018-07-11 MED ORDER — ACETAMINOPHEN 325 MG PO TABS
650.0000 mg | ORAL_TABLET | ORAL | Status: DC | PRN
  Administered 2018-07-11: 650 mg via ORAL
  Filled 2018-07-11: qty 2

## 2018-07-11 NOTE — Progress Notes (Addendum)
CCS/Oleg Oleson Progress Note 2 Days Post-Op  Subjective: Patient spiked a fever to 101.  Flushed in the face.  Not as energetic as before.  Nurse is a bit concerned about her.  VSS.  Oxygenating well at 95% on RA.  Wound looks fine  Objective: Vital signs in last 24 hours: Temp:  [99.4 F (37.4 C)-101 F (38.3 C)] 101 F (38.3 C) (12/18 0504) Pulse Rate:  [76-86] 82 (12/18 0504) Resp:  [16-20] 20 (12/18 0504) BP: (91-106)/(33-42) 104/42 (12/18 0504) SpO2:  [85 %-100 %] 95 % (12/18 0504) Last BM Date: 07/09/18  Intake/Output from previous day: 12/17 0701 - 12/18 0700 In: 1817.9 [P.O.:1020; I.V.:797.9] Out: 300 [Urine:300] Intake/Output this shift: No intake/output data recorded.  General: No acute distress.  Just feels a bit washed out  Lungs: Clear to auscultation.  Abd: Soft, good bowel sounds.  Passing flatus.  No  Bowel movement.     Extremities: Slightly more edematous than yesterday.  Neuro: Intact  Lab Results:  @LABLAST2 (wbc:2,hgb:2,hct:2,plt:2) BMET ) Recent Labs    07/10/18 0150 07/11/18 0312  NA 137 132*  K 4.6 5.2*  CL 98 98  CO2 27 23  GLUCOSE 208* 130*  BUN 49* 54*  CREATININE 2.04* 2.77*  CALCIUM 8.7* 8.4*   PT/INR No results for input(s): LABPROT, INR in the last 72 hours. ABG No results for input(s): PHART, HCO3 in the last 72 hours.  Invalid input(s): PCO2, PO2  Studies/Results: No results found.  Anti-infectives: Anti-infectives (From admission, onward)   Start     Dose/Rate Route Frequency Ordered Stop   07/09/18 1730  ceFAZolin (ANCEF) IVPB 2g/100 mL premix     2 g 200 mL/hr over 30 Minutes Intravenous Every 8 hours 07/09/18 1424 07/09/18 1859   07/09/18 0645  ceFAZolin (ANCEF) IVPB 2g/100 mL premix     2 g 200 mL/hr over 30 Minutes Intravenous On call to O.R. 07/09/18 0644 07/09/18 0950      Assessment/Plan: s/p Procedure(s): EXPLORATION OF  ABDOMINAL WOUND ERAS PATHWAY SMALL BOWEL RESECTION INSERTION OF VICRYL  MESH LYSIS OF ADHESION Restart home diuretic.  Physical therapy consultation.  LOS: 2 days   Kathryne Eriksson. [name], MD, FACS [phone] [facility] 07/11/2018

## 2018-07-11 NOTE — Progress Notes (Signed)
Temp 101. Dr Donnie Mesa made aware, Verbal order received for tylenol

## 2018-07-12 ENCOUNTER — Inpatient Hospital Stay (HOSPITAL_COMMUNITY): Payer: Medicare Other

## 2018-07-12 DIAGNOSIS — I5022 Chronic systolic (congestive) heart failure: Secondary | ICD-10-CM

## 2018-07-12 DIAGNOSIS — G4733 Obstructive sleep apnea (adult) (pediatric): Secondary | ICD-10-CM

## 2018-07-12 DIAGNOSIS — G9341 Metabolic encephalopathy: Secondary | ICD-10-CM

## 2018-07-12 DIAGNOSIS — N179 Acute kidney failure, unspecified: Secondary | ICD-10-CM

## 2018-07-12 LAB — CBC WITH DIFFERENTIAL/PLATELET
Abs Immature Granulocytes: 0.09 10*3/uL — ABNORMAL HIGH (ref 0.00–0.07)
Basophils Absolute: 0 10*3/uL (ref 0.0–0.1)
Basophils Relative: 0 %
Eosinophils Absolute: 0.2 10*3/uL (ref 0.0–0.5)
Eosinophils Relative: 2 %
HCT: 33.7 % — ABNORMAL LOW (ref 36.0–46.0)
HEMOGLOBIN: 10.2 g/dL — AB (ref 12.0–15.0)
Immature Granulocytes: 1 %
LYMPHS ABS: 1 10*3/uL (ref 0.7–4.0)
LYMPHS PCT: 9 %
MCH: 25.1 pg — AB (ref 26.0–34.0)
MCHC: 30.3 g/dL (ref 30.0–36.0)
MCV: 83 fL (ref 80.0–100.0)
Monocytes Absolute: 0.7 10*3/uL (ref 0.1–1.0)
Monocytes Relative: 7 %
Neutro Abs: 8.1 10*3/uL — ABNORMAL HIGH (ref 1.7–7.7)
Neutrophils Relative %: 81 %
Platelets: 180 10*3/uL (ref 150–400)
RBC: 4.06 MIL/uL (ref 3.87–5.11)
RDW: 15 % (ref 11.5–15.5)
WBC: 10.1 10*3/uL (ref 4.0–10.5)
nRBC: 0 % (ref 0.0–0.2)

## 2018-07-12 LAB — GLUCOSE, CAPILLARY
Glucose-Capillary: 195 mg/dL — ABNORMAL HIGH (ref 70–99)
Glucose-Capillary: 190 mg/dL — ABNORMAL HIGH (ref 70–99)
Glucose-Capillary: 177 mg/dL — ABNORMAL HIGH (ref 70–99)
Glucose-Capillary: 173 mg/dL — ABNORMAL HIGH (ref 70–99)

## 2018-07-12 LAB — URINALYSIS, ROUTINE W REFLEX MICROSCOPIC
Bilirubin Urine: NEGATIVE
Glucose, UA: NEGATIVE mg/dL
Hgb urine dipstick: NEGATIVE
Ketones, ur: NEGATIVE mg/dL
Nitrite: NEGATIVE
Protein, ur: NEGATIVE mg/dL
Specific Gravity, Urine: 1.01 (ref 1.005–1.030)
pH: 6 (ref 5.0–8.0)

## 2018-07-12 LAB — BASIC METABOLIC PANEL
Anion gap: 13 (ref 5–15)
BUN: 53 mg/dL — AB (ref 8–23)
CO2: 27 mmol/L (ref 22–32)
Calcium: 9.1 mg/dL (ref 8.9–10.3)
Chloride: 93 mmol/L — ABNORMAL LOW (ref 98–111)
Creatinine, Ser: 2.33 mg/dL — ABNORMAL HIGH (ref 0.44–1.00)
GFR calc Af Amer: 24 mL/min — ABNORMAL LOW (ref >60)
GFR calc non Af Amer: 21 mL/min — ABNORMAL LOW (ref >60)
Glucose, Bld: 208 mg/dL — ABNORMAL HIGH (ref 70–99)
Potassium: 4.5 mmol/L (ref 3.5–5.1)
Sodium: 133 mmol/L — ABNORMAL LOW (ref 135–145)

## 2018-07-12 MED ORDER — TORSEMIDE 20 MG PO TABS
20.0000 mg | ORAL_TABLET | Freq: Once | ORAL | Status: AC
  Administered 2018-07-12: 20 mg via ORAL
  Filled 2018-07-12: qty 1

## 2018-07-12 MED ORDER — GABAPENTIN 400 MG PO CAPS: 400.0000 mg | ORAL_CAPSULE | Freq: Every day | ORAL | Status: DC

## 2018-07-12 NOTE — Care Management Note (Signed)
Case Management Note  Patient Details  Name: Crystal [name] MRN: [id number] Date of Birth: 04/08/1951  Subjective/Objective:                    Action/Plan:  Discussed discharge planning with patient and family at bedside. She has had home health in past and would like [facility]. Voicemail left for Dan with [facility]. Awaiting call back.   Patient currently on room air. Patient has home oxygen already through [facility] , but only uses it "when I need it". Asked if she has a portal tank to use when she is discharged. ( family plans on transporting her home at discharge via private car). Patient states she does not have a portal tank, and she feels she does not need oxygen to get home. Assured patient and family NCM can order a portal tank on day of discharge if needed, patient and family voiced understanding.   Will continue to follow.  Expected Discharge Date:                  Expected Discharge Plan:  [place]  In-House Referral:     Discharge planning Services  CM Consult  Post Acute Care Choice:  Home Health Choice offered to:  Patient, Adult Children  DME Arranged:  N/A DME Agency:  NA  HH Arranged:  PT [place] Agency:  [place]  Status of Service:  In process, will continue to follow  If discussed at Long Length of Stay Meetings, dates discussed:    Additional Comments:  Marilu Favre, RN 07/12/2018, 12:31 PM

## 2018-07-12 NOTE — Evaluation (Signed)
Physical Therapy Evaluation Patient Details Name: Crystal [name] MRN: [id number] DOB: [birth year] Today's Date: 07/12/2018   History of Present Illness  67 y.o. female admitted with non-healing abdominal wound and s/p 12/17 ex lap revealing significant small bowel attachment to the abdominal wall and a large midline hernia defect, small bowel resection and mesh placement performed. 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 Pt with significant PMH of syncope, obesity, OSA on CPAP, MI, LBBB, ischemic cardiomyopathy, HTN, gout, heart valve disorder, DM with neuropathy, DJD, CKD, CHF, bradycardia, chronic low back pain.   Clinical Impression  Pt has had numerous surgeries this year and her mobility as decreased as a result. PTA pt was independent with ambulation without AD, and requiring minor assist with getting in and out of bathtub with tub bench but otherwise independent with ADLs. Pt currently is limited in safe mobility by decreased strength and mobility and need for supplemental oxygen with activity (see Ambulating Oxygen Saturation note).  Pt with history of 2L O2 usage at night but is now requiring supplemental O2 for ambulation. Pt is currently min guard for transfers and ambulation without AD, unable to attempt stair training due to fatigue with ambulation. PT recommending HHPT level rehab at dc to improve pt strength and endurance to safely navigate in her home environment. PT will continue to follow acutely.    Follow Up Recommendations Home health PT;Supervision - Intermittent    Equipment Recommendations  None recommended by PT(has needed equipment at home)       Precautions / Restrictions Precautions Precautions: ICD/Pacemaker Precaution Comments: ICD replacement 11/30  Restrictions Weight Bearing Restrictions: Yes Other Position/Activity Restrictions: L UE lifting restrictions through 2/20 for ICD placement  06/23/18      Mobility  Bed Mobility               General bed mobility comments: OOB in recliner  Transfers Overall transfer level: Needs assistance Equipment used: None Transfers: Sit to/from Stand Sit to Stand: Min guard         General transfer comment: min guard for safety, good power up and steadying in standing  Ambulation/Gait Ambulation/Gait assistance: Min guard Gait Distance (Feet): 200 Feet Assistive device: None Gait Pattern/deviations: Step-through pattern;Shuffle;Decreased stride length Gait velocity: slowed Gait velocity interpretation: 1.31 - 2.62 ft/sec, indicative of limited community ambulator General Gait Details: min guard for safety with slow, shuffling gait, mildly unsteady, however no overt LoB, pt with 3/4 DoE requiring 1x standing rest break, SaO2 92%O2 on 2L O2 via [place]      Balance Overall balance assessment: Mild deficits observed, not formally tested                                           Pertinent Vitals/Pain Pain Assessment: 0-10 Pain Score: 5  Pain Location: R side Pain Descriptors / Indicators: Aching;Sore Pain Intervention(s): Limited activity within patient's tolerance;Monitored during session;Repositioned    Home Living Family/patient expects to be discharged to:: Private residence Living Arrangements: Children;Other relatives Available Help at Discharge: Family;Available 24 hours/day Type of Home: House Home Access: Level entry     Home Layout: Able to live on main level with bedroom/bathroom;Bed/bath upstairs;Two level Home Equipment: Walker - 2 wheels;Grab bars - tub/shower;Cane - single point;Bedside commode;Tub bench;Wheelchair - manual      Prior Function Level of  Independence: Independent               Hand Dominance   Dominant Hand: Right    Extremity/Trunk Assessment   Upper Extremity Assessment Upper Extremity Assessment: LUE deficits/detail LUE Deficits / Details: L UE  pain from ICE placement 11/30, area continues to be red with decreased wound healing for time elapsed, shoulder ROM limited, elbow and wrist ROM WFL, pt with complaints of L shoulder pain LUE: Unable to fully assess due to pain    Lower Extremity Assessment Lower Extremity Assessment: Generalized weakness       Communication   Communication: No difficulties  Cognition Arousal/Alertness: Awake/alert Behavior During Therapy: WFL for tasks assessed/performed Overall Cognitive Status: Within Functional Limits for tasks assessed                                        General Comments General comments (skin integrity, edema, etc.): Pt on 2L O2 via [place] at entry SaO2 95%O2. Pt reports use of O2 at home at night only. SaO2 on RA dropped to 83%O2. 2L supplemental O2 returned via [place] and pt able to ambulated maintaining SaO2 greater than 92%O2 throughout           Assessment/Plan    PT Assessment Patient needs continued PT services  PT Problem List Decreased activity tolerance;Decreased mobility;Pain;Cardiopulmonary status limiting activity       PT Treatment Interventions DME instruction;Gait training;Stair training;Functional mobility training;Therapeutic activities;Therapeutic exercise;Balance training;Patient/family education    PT Goals (Current goals can be found in the Care Plan section)  Acute Rehab PT Goals Patient Stated Goal: get around easier PT Goal Formulation: With patient Time For Goal Achievement: 07/26/18 Potential to Achieve Goals: Good    Frequency Min 3X/week    AM-PAC PT "6 Clicks" Mobility  Outcome Measure Help needed turning from your back to your side while in a flat bed without using bedrails?: A Little Help needed moving from lying on your back to sitting on the side of a flat bed without using bedrails?: A Little Help needed moving to and from a bed to a chair (including a wheelchair)?: A Little Help needed standing up from a chair using  your arms (e.g., wheelchair or bedside chair)?: A Little Help needed to walk in hospital room?: A Little Help needed climbing 3-5 steps with a railing? : A Lot 6 Click Score: 17    End of Session Equipment Utilized During Treatment: Gait belt;Oxygen Activity Tolerance: Patient limited by fatigue Patient left: in chair;with call bell/phone within reach;with chair alarm set Nurse Communication: Mobility status;Other (comment)(O2 needed for ambulation ) PT Visit Diagnosis: Other abnormalities of gait and mobility (R26.89);Difficulty in walking, not elsewhere classified (R26.2);Pain;Unsteadiness on feet (R26.81) Pain - Right/Left: Right Pain - part of body: (lower abdomen)    Time: 7782-4235 PT Time Calculation (min) (ACUTE ONLY): 29 min   Charges:   PT Evaluation $PT Eval Moderate Complexity: 1 Mod PT Treatments $Gait Training: 8-22 mins        Larkyn Greenberger B. Migdalia Dk PT, DPT Acute Rehabilitation Services Pager [phone] Office [phone]   [place] 07/12/2018, 10:01 AM

## 2018-07-12 NOTE — Progress Notes (Signed)
Inpatient Diabetes Program Recommendations  AACE/ADA: New Consensus Statement on Inpatient Glycemic Control (2015)  Target Ranges:  Prepandial:   less than 140 mg/dL      Peak postprandial:   less than 180 mg/dL (1-2 hours)      Critically ill patients:  140 - 180 mg/dL   Lab Results  Component Value Date   GLUCAP 195 (H) 07/12/2018   HGBA1C 10.4 (H) 07/10/2018    Review of Glycemic Control Results for Crystal [name], Crystal [name] (MRN [id number]) as of 07/12/2018 09:49  Ref. Range 07/11/2018 17:23 07/11/2018 21:53 07/12/2018 08:22  Glucose-Capillary Latest Ref Range: 70 - 99 mg/dL 198 (H) 193 (H) 195 (H)   Diabetes history: Type 2 DM Outpatient Diabetes medications: Toujeo 110 units QHS, Apidra 25 units TID Current orders for Inpatient glycemic control: Novolog 0-20 units TID, Novolog 0-5 units QHS, Novolog 4 units TID, Levemir 10 units BID  Inpatient Diabetes Program Recommendations:    If FSBS continues to exceed 180 mg/dL, consider increase of Levemir to 12 units BID.   Thanks, Bronson Curb, MSN, RNC-OB Diabetes Coordinator [phone] (8a-5p)

## 2018-07-12 NOTE — Progress Notes (Signed)
Patient ID: Crystal [name], female   DOB: Aug 28, 1950, 67 y.o.   MRN: [id number]    3 Days Post-Op  Subjective: Patient with worsening confusion since last night, which is new.  Patient is oriented, but plays with her hands in the air like she is sewing and is pulling at her IV and other things.  She is voiding well.  Feels a little SOB at times.  O2 sats were in the low 80s today with PT at rest and ambulating.  Came up to low 90s with O2.  Denies urinary complaints, but family states the only other time she has had delirium was when she had a UTI.  Objective: Vital signs in last 24 hours: Temp:  [98.8 F (37.1 C)-99.1 F (37.3 C)] 99.1 F (37.3 C) (12/19 1314) Pulse Rate:  [75-80] 75 (12/19 1314) Resp:  [14-19] 19 (12/19 1314) BP: (103-131)/(41-85) 103/85 (12/19 1314) SpO2:  [90 %-100 %] 100 % (12/19 1314) Last BM Date: 07/12/18  Intake/Output from previous day: 12/18 0701 - 12/19 0700 In: 940 [P.O.:940] Out: 1650 [Urine:1650] Intake/Output this shift: Total I/O In: 537 [P.O.:537] Out: -   PE: Gen: patient playing with her hands in the air.  Acting like she is sewing things.  Otherwise NAD Heart: regular Lungs: CTAB, decrease BS at left lung base Abd: soft, protuberant which is her baseline, midline incision is c/d/i with sutures present, +BS Ext: mild edema of her arms, but no edema of her LE Psych: A&Ox3, but will talk about random nonsensical things at times as well as pulls on her IV, abdominal binder, and plays with her hands in the air.  Lab Results:  Recent Labs    07/11/18 0312 07/12/18 0549  WBC 12.8* 10.1  HGB 10.7* 10.2*  HCT 34.5* 33.7*  PLT 125* 180   BMET Recent Labs    07/11/18 0312 07/12/18 0549  NA 132* 133*  K 5.2* 4.5  CL 98 93*  CO2 23 27  GLUCOSE 130* 208*  BUN 54* 53*  CREATININE 2.77* 2.33*  CALCIUM 8.4* 9.1   PT/INR No results for input(s): LABPROT, INR in the last 72 hours. CMP     Component Value Date/Time   NA 133 (L)  07/12/2018 0549   NA 140 06/11/2018 1116   K 4.5 07/12/2018 0549   CL 93 (L) 07/12/2018 0549   CO2 27 07/12/2018 0549   GLUCOSE 208 (H) 07/12/2018 0549   BUN 53 (H) 07/12/2018 0549   BUN 34 (H) 06/11/2018 1116   CREATININE 2.33 (H) 07/12/2018 0549   CREATININE 3.10 (H) 08/17/2017 1545   CALCIUM 9.1 07/12/2018 0549   PROT 8.5 (H) 09/15/2017 1629   ALBUMIN 4.2 09/15/2017 1629   AST 10 (A) 10/03/2017   ALT 11 10/03/2017   ALKPHOS 244 (A) 10/03/2017   BILITOT 1.1 09/15/2017 1629   GFRNONAA 21 (L) 07/12/2018 0549   GFRNONAA 15 (L) 08/17/2017 1545   GFRAA 24 (L) 07/12/2018 0549   GFRAA 17 (L) 08/17/2017 1545   Lipase     Component Value Date/Time   LIPASE 35 09/15/2017 1629       Studies/Results: No results found.  Anti-infectives: Anti-infectives (From admission, onward)   Start     Dose/Rate Route Frequency Ordered Stop   07/09/18 1730  ceFAZolin (ANCEF) IVPB 2g/100 mL premix     2 g 200 mL/hr over 30 Minutes Intravenous Every 8 hours 07/09/18 1424 07/09/18 1859   07/09/18 0645  ceFAZolin (ANCEF) IVPB 2g/100 mL premix  2 g 200 mL/hr over 30 Minutes Intravenous On call to O.R. 07/09/18 8335 07/09/18 0950       Assessment/Plan  POD 3, s/p ex lap with SBR and insertion of vicryl mesh -abdomen stable.  Doing well on full liquids today with 3 BMs and passing flatus -likely adv to soft diet tomorrow -mobilize and pulm toilet 0O2 as needed  CKD -cr 2.33 today improved from 2.7 yesterday CHF -seems fairly euvolemic, but give decrease BS at LLL and some O2 desats earlier today, will chest a CXR to assure stability.  Will give one dose of demadex today and recheck BMET in am. Delirium -new today per family.  Likely multifactorial.  No symptoms of UTI, but family states the only other time she has had this was with a UTI.  Temp of 101 yesterday.  Will check UA just to rule out UTI, but low suspicion.  Likely secondary to post anesthesia, new environment, and multiple  medical problems.  narcs are being held.  Have asked medicine to see her given her multitude of medical problems just to make sure we aren't missing anything and for their advice on all of her medications.  FEN - full liquids VTE - Lovenox ID - none   LOS: 3 days    Henreitta Cea , [facility] 07/12/2018, 4:25 PM Pager: [phone]

## 2018-07-12 NOTE — Consult Note (Signed)
[facility]       PATIENT DETAILS Name: Crystal [name] Age: 67 y.o. Sex: female Date of Birth: 1950/09/02 Admit Date: 07/09/2018 OAC:ZYSAYT, [name], MD Requesting MD: Judeth Horn, MD   Date of consultation: 07/12/2018   REASON FOR CONSULTATION:  Delirium, management of multiple medical problems.  Impression with Recommendations: Waxing and waning confusion: Suspect delirium-she has had similar issues of confusion during her prior hospital stay as well.  Apart from mild AK I really no metabolic abnormalities.  She has no clinical features consistent with UTI/pneumonia or any sort of infection-suspect recent fever was likely more from atelectasis. Even if the UA is abnormal-would not treat as this probably represents asymptomatic bacteriuria.  She does take 800 mg of Neurontin nightly, and 100 mg in the morning-with her AKI-this may be contributing to her confusion.  We will hold tonight's dose, and decrease dosage to 400 mg starting tomorrow.  Continue to minimize narcotic use-she is not taking Restoril-I have discontinued that as well.  Maintain delirium precautions.  AKI on CKD stage III: Suspect AKI is hemodynamically mediated-we will asked RN to check frequent bladder scans, if renal function worsens further-we can contemplate further work-up.  Recent fever: Suspect atelectasis-encourage incentive spirometry.  If fever reoccurs or is persistent-would panculture.  Monitor off antimicrobial therapy for now.  Chronic systolic heart failure (EF 25-30% by TTE on 05/25/2018): She is completely euvolemic-would continue to hold diuretics-would follow volume status, daily weights and electrolytes.  Agree with continuing Coreg-her blood pressure is slowly rebounding but due to AKI would continue to hold Entresto.  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  Hypothyroidism: Continue Synthroid  OSA/chronic hypoxemic respiratory failure: Continue CPAP-she is on nocturnal O2 at home that is blended into her CPAP  [facility] will followup again tomorrow. Please contact me if I can be of assistance in the meanwhile. Thank you for this consultation.  Total time spent 45 minutes.  HISTORY OF PRESENT ILLNESS: Crystal [name] is an 67 y.o. 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  She underwent exploration of the abdominal wound, small bowel resection and lysis of adhesions on 12/16.  She was noted to have mild delirium, worsening of renal function-in the hospitalist service was asked to evaluate and manage her chronic medical conditions.  During my evaluation-patient was awake and alert most of the time-she was able to answer all my questions appropriately.  Family at bedside-they do acknowledge delirium during her prior hospital stays as well.  No chest pain, shortness of breath, no nausea, no vomiting or diarrhea.  She has some mild but appropriate pain at the incision site.   REVIEW OF SYSTEMS:  Constitutional:   No  weight loss, night sweats,  Fevers, chills, fatigue.  HEENT:    No headaches, Difficulty swallowing,Tooth/dental problems,Sore throat  Cardio-vascular: No chest pain,  Orthopnea, PND, swelling in lower extremities, anasarca,         dizziness, palpitations  GI:  No heartburn, indigestion, nausea, vomiting, diarrhea, change in       bowel habits, loss of appetite  Resp: No shortness of breath with exertion or at rest.  No excess mucus, no productive cough, No non-productive cough,  No coughing up of blood.No change in color of mucus.No  wheezing.No chest wall deformity  Skin:  no rash or lesions.  GU:  no dysuria, change in color of urine, no urgency or frequency.  No flank pain.  Musculoskeletal: No joint pain or swelling.  No decreased range of motion.  No back pain.   ALLERGIES:   Allergies  Allergen Reactions  . Ace Inhibitors Swelling and Other (See Comments)    Angioedema  . Glucophage [Metformin Hcl] Other (See Comments)    Renal failure  . Telmisartan Other (See Comments)    AVOID ARB/ ACEi in this patient due to recurrent AKI  . Advicor [Niacin-Lovastatin Er] Other (See Comments)    Muscle aches  . Bystolic [Nebivolol Hcl] Swelling and Other (See Comments)    UNSPECIFIED SWELLING/EDEMA  . Erythromycin Diarrhea, Nausea And Vomiting and Other (See Comments)    *DERIVATIVES*  . Lipitor [Atorvastatin] Other (See Comments)    Muscle aches  . Lopid [Gemfibrozil] Other (See Comments)    Muscle aches  . Statins Other (See Comments)    Muscle aches    PAST MEDICAL HISTORY: Past Medical History:  Diagnosis Date  . AICD (automatic cardioverter/defibrillator) present    high RV threshold chronically, device was turned off in 2014; Device battery has been dead x 7 years; "turned it back on 06/22/2018"  . Anemia, iron deficiency   . Angioedema    felt to likely be due to ace inhibitors but says she has had this even off of medicines, appears to be tolerating ARBs chronically  . Anxiety   . Arthritis    "hands, legs, arms; bad in my back" (06/22/2018)  . Asthmatic bronchitis with status asthmaticus   . Bradycardia   . CHF (congestive heart failure) ([place])   . Chronic lower back pain   . Chronic pain   . Chronic renal insufficiency   . CKD (chronic kidney disease), stage III ([place])   . Coronary artery disease    Mild nonobstructive (30% LAD, 30% RCA) by 09/01/16 cath at [facility]  . Degenerative joint disease   . Depression   . Diabetes mellitus, type 2 ([place])   . Diabetic peripheral  neuropathy ([place])   . Dizziness   . Dyspnea on exertion   . Family history of adverse reaction to anesthesia    Mother has nausea  . GERD (gastroesophageal reflux disease)   . Gout   . Heart valve disorder   . History of blood transfusion 1981; ~ 2005; 12/2016   "childbirth; defibrillator OR; colostomy OR"  . History of mononucleosis 03/2014  . Hyperlipidemia   . Hypertension   . Hypothyroid   . Insomnia   . Intervertebral disc degeneration   . Ischemic cardiomyopathy   . LBBB (left bundle branch block)   . Myocardial infarction ([place]) dx'd ~ 2005  . Nonischemic cardiomyopathy ([place])    s/p ICD in 2005. EF has since recovered  . Obesity   . On home oxygen therapy    "2L at night" (06/22/2018)  . OSA on CPAP   . Pneumonia    "couple times; last time was 12/2016" (06/22/2018)  . PONV (postoperative nausea and vomiting)   . Presence of permanent cardiac pacemaker    [employer]  . Swelling   . Syncope   . Systemic hypertension   . Vitamin D deficiency     PAST SURGICAL HISTORY: Past Surgical History:  Procedure Laterality Date  . APPENDECTOMY  07/13/2017  .  BLADDER SUSPENSION  1990   "w/hysterectomy"  . BOWEL RESECTION N/A 07/09/2018   Procedure: SMALL BOWEL RESECTION;  Surgeon: Judeth Horn, MD;  Location: [place];  Service: General;  Laterality: N/A;  . CARDIAC CATHETERIZATION  2005   no obstructive CAD per patient  . CARDIAC CATHETERIZATION  2018  . CARDIAC DEFIBRILLATOR PLACEMENT  2005   BiV ICD implanted,  LV lead is an epicardial lead  . CARPAL TUNNEL RELEASE Left 07/25/2014   Dr.Williamson   . CATARACT EXTRACTION W/ INTRAOCULAR LENS  IMPLANT, BILATERAL Bilateral   . COLONIC STENT PLACEMENT N/A 08/31/2017   Procedure: COLONIC STENT PLACEMENT;  Surgeon: Carol Ada, MD;  Location: [place];  Service: Endoscopy;  Laterality: N/A;  . COLONOSCOPY     2010-2011 Dr.Kipreos   . COLOSTOMY  12/2016   Archie Endo 01/26/2017  . COLOSTOMY REVERSAL N/A 07/13/2017    Procedure: COLOSTOMY REVERSAL;  Surgeon: Judeth Horn, MD;  Location: [place];  Service: General;  Laterality: N/A;  . CYST REMOVAL HAND Right 05/2013   thumb  . DILATION AND CURETTAGE OF UTERUS  X 5-6  . EXCISION MASS ABDOMINAL N/A 07/09/2018   Procedure: EXPLORATION OF  ABDOMINAL WOUND ERAS PATHWAY;  Surgeon: Judeth Horn, MD;  Location: [place];  Service: General;  Laterality: N/A;  . FLEXIBLE SIGMOIDOSCOPY N/A 08/24/2017   Procedure: Beryle Quant;  Surgeon: Carol Ada, MD;  Location: [place];  Service: Endoscopy;  Laterality: N/A;  . FLEXIBLE SIGMOIDOSCOPY N/A 08/31/2017   Procedure: FLEXIBLE SIGMOIDOSCOPY;  Surgeon: Carol Ada, MD;  Location: [place];  Service: Endoscopy;  Laterality: N/A;  stent placement  . IMPLANTABLE CARDIOVERTER DEFIBRILLATOR GENERATOR CHANGE  2008  . INSERTION OF MESH N/A 07/09/2018   Procedure: INSERTION OF VICRYL MESH;  Surgeon: Judeth Horn, MD;  Location: [place];  Service: General;  Laterality: N/A;  . LAPAROTOMY N/A 09/18/2017   Procedure: EXPLORATORY LAPAROTOMY;  Surgeon: Judeth Horn, MD;  Location: [place];  Service: General;  Laterality: N/A;  . LEAD REVISION  06/22/2018  . LEAD REVISION/REPAIR N/A 06/22/2018   Procedure: LEAD REVISION/REPAIR;  Surgeon: Deboraha Sprang, MD;  Location: [place] [facility];  Service: Cardiovascular;  Laterality: N/A;  . LYSIS OF ADHESION N/A 09/18/2017   Procedure: LYSIS OF ADHESION;  Surgeon: Judeth Horn, MD;  Location: [place];  Service: General;  Laterality: N/A;  . LYSIS OF ADHESION N/A 07/09/2018   Procedure: LYSIS OF ADHESION;  Surgeon: Judeth Horn, MD;  Location: [place];  Service: General;  Laterality: N/A;  . PARTIAL COLECTOMY N/A 09/18/2017   Procedure: ILEOCOLECTOMY;  Surgeon: Judeth Horn, MD;  Location: [place];  Service: General;  Laterality: N/A;  . PARTIAL COLECTOMY  09/25/2017  . RIGHT/LEFT HEART CATH AND CORONARY ANGIOGRAPHY N/A 06/01/2018   Procedure: RIGHT/LEFT HEART CATH AND CORONARY  ANGIOGRAPHY;  Surgeon: Jettie Booze, MD;  Location: [place] [facility];  Service: Cardiovascular;  Laterality: N/A;  . SIGMOIDOSCOPY N/A 09/18/2017   Procedure: SIGMOIDOSCOPY;  Surgeon: Judeth Horn, MD;  Location: [place];  Service: General;  Laterality: N/A;  . SMALL INTESTINE SURGERY  07/09/2018   EXPLORATION OF ABDOMINAL WOUND ERAS PATHWAYN/; MESH; LYSIS OF ADHESIONS  . TOTAL ABDOMINAL HYSTERECTOMY  1990  . TRIGGER FINGER RELEASE Right 05/213  . TUBAL LIGATION  1980s  . VESICOVAGINAL FISTULA CLOSURE W/ TAH      MEDICATIONS AT HOME: Prior to Admission medications   Medication Sig Start Date End Date Taking? Authorizing Provider  acetaminophen (TYLENOL) 500 MG tablet Take 1,000  mg by mouth every 6 (six) hours as needed for moderate pain or headache.   Yes [provider]  albuterol (PROVENTIL HFA;VENTOLIN HFA) 108 (90 Base) MCG/ACT inhaler Inhale 1-2 puffs into the lungs every 6 (six) hours as needed for wheezing or shortness of breath.   Yes [provider]  allopurinol (ZYLOPRIM) 100 MG tablet TAKE 1 TABLET(100 MG) BY MOUTH DAILY Patient taking differently: Take 100 mg by mouth daily.  05/21/18  Yes Gildardo Cranker, DO  Calcium Carbonate-Vitamin D (CALTRATE 600+D) 600-400 MG-UNIT tablet Take 1 tablet by mouth 2 (two) times daily.   Yes [provider]  carvedilol (COREG) 12.5 MG tablet Take 12.5 mg by mouth 2 (two) times daily with a meal.   Yes [provider]  CRESTOR 10 MG tablet Take 1 tablet (10 mg total) by mouth at bedtime. 02/15/17  Yes Love, Ivan Anchors, PA-C  DULoxetine (CYMBALTA) 30 MG capsule TAKE ONE CAPSULE BY MOUTH DAILY 04/06/18  Yes Eulas Post, Brayton Layman, DO  gabapentin (NEURONTIN) 100 MG capsule TAKE 1 CAPSULE BY MOUTH EVERY MORNING 04/12/18  Yes Eulas Post, Monica, DO  gabapentin (NEURONTIN) 800 MG tablet TAKE 1 TABLET BY MOUTH DAILY AT BEDTIME 06/04/18  Yes Lauree Chandler, NP  Insulin Glargine (TOUJEO MAX SOLOSTAR) 300 UNIT/ML SOPN Inject  110 Units into the skin every evening.    Yes [provider]  Insulin Glulisine (APIDRA) 100 UNIT/ML Solostar Pen Inject 25 Units into the skin 3 (three) times daily. 05/07/18  Yes Gildardo Cranker, DO  levothyroxine (SYNTHROID, LEVOTHROID) 88 MCG tablet Take 88 mcg by mouth daily before breakfast.  10/16/15  Yes [provider]  loratadine (CLARITIN) 10 MG tablet Take 10 mg by mouth daily.   Yes [provider]  montelukast (SINGULAIR) 10 MG tablet Take 10 mg by mouth at bedtime.   Yes [provider]  omega-3 acid ethyl esters (LOVAZA) 1 g capsule Take 4 g by mouth at bedtime.    Yes [provider]  OXYGEN Inhale 2 L into the lungs at bedtime.    Yes [provider]  pantoprazole (PROTONIX) 40 MG tablet Take 40 mg by mouth every evening. 05/23/18  Yes [provider]  potassium chloride SA (K-DUR,KLOR-CON) 20 MEQ tablet Take 1 tablet (20 mEq total) by mouth daily. 06/11/18  Yes Deboraha Sprang, MD  sacubitril-valsartan (ENTRESTO) 24-26 MG Take 1 tablet by mouth 2 (two) times daily. 06/11/18  Yes Deboraha Sprang, MD  temazepam (RESTORIL) 15 MG capsule Take 1 capsule (15 mg total) by mouth at bedtime as needed for sleep. 05/07/18  Yes Eulas Post, Monica, DO  torsemide (DEMADEX) 20 MG tablet TAKE 40 MG FOR 3 DAYS THEN DECREASE TO 20MG  EVERY DAY Patient taking differently: Take 20 mg by mouth daily.  06/11/18  Yes Deboraha Sprang, MD  diclofenac sodium (VOLTAREN) 1 % GEL Apply 2 g topically 4 (four) times daily as needed (for pain.).  05/03/18   [provider]    FAMILY HISTORY: Family History  Problem Relation Age of Onset  . Hypertension Father   . Heart disease Father   . Cancer Father        prostate  . Heart disease Other        (Maternal side) Ischemic heart disease  . Diabetes Mellitus II Other   . Arrhythmia Mother   . Diabetes Mellitus II Mother        Borderline DM  . Hypertension Mother   . Asthma Mother   .  Cancer Brother   . Dementia Brother   . Hypertension Brother   . Hypertension Daughter   . Hypertension Daughter   . Diabetes Daughter   . Hypertension Daughter   . Atrial fibrillation Daughter   . GER disease Daughter   . Hypertension Son   . Anxiety disorder Son   . Hypothyroidism Son   . Breast cancer Maternal Grandmother     SOCIAL HISTORY:  reports that she has never smoked. She has never used smokeless tobacco. She reports that she does not drink alcohol or use drugs.  PHYSICAL EXAM: Blood pressure 103/85, pulse 75, temperature 99.1 F (37.3 C), temperature source Oral, resp. rate 19, height 5\' 6"  (1.676 m), weight 98.9 kg, SpO2 100 %.   General appearance :Awake, alert, not in any distress.  Eyes:, pupils equally reactive to light and accomodation,no scleral icterus.Pink conjunctiva HEENT: Atraumatic and Normocephalic Neck: supple, no JVD. No cervical lymphadenopathy. No thyromegaly Resp:Good air entry bilaterally, no added sounds  CVS: S1 S2 regular GI: Bowel sounds present, appropriately tender at the incision site-wound looks dry-and without signs of infection. Extremities: B/L Lower Ext shows no edema, both legs are warm to touch Neurology:  speech clear,Non focal, sensation is grossly intact. Psychiatric: Normal judgment and insight. Alert and oriented x 3.  Musculoskeletal:gait appears to be normal.No digital cyanosis Skin:No Rash, warm and dry Wounds:N/A  Data reviewed:   I have personally reviewed following labs and imaging studies  LABORATORY DATA: CBC: Recent Labs  Lab 07/09/18 0711 07/10/18 0150 07/11/18 0312 07/12/18 0549  WBC 9.5 15.1* 12.8* 10.1  NEUTROABS 4.9  --   --  8.1*  HGB 12.9 12.0 10.7* 10.2*  HCT 42.0 41.3 34.5* 33.7*  MCV 82.4 85.0 85.2 83.0  PLT 216 210 125* 161    Basic Metabolic Panel: Recent Labs  Lab 07/09/18 0711 07/10/18 0150 07/11/18 0312 07/12/18 0549  NA 138 137 132* 133*  K 4.0 4.6 5.2* 4.5  CL 99 98 98 93*    CO2 24 27 23 27   GLUCOSE 170* 208* 130* 208*  BUN 61* 49* 54* 53*  CREATININE 2.15* 2.04* 2.77* 2.33*  CALCIUM 9.5 8.7* 8.4* 9.1   GFR Estimated Creatinine Clearance: 27.4 mL/min (A) (by C-G formula based on SCr of 2.33 mg/dL (H)).  Liver Function Tests: No results for input(s): AST, ALT, ALKPHOS, BILITOT, PROT, ALBUMIN in the last 168 hours. No results for input(s): LIPASE, AMYLASE in the last 168 hours. No results for input(s): AMMONIA in the last 168 hours.  Coagulation profile No results for input(s): INR, PROTIME in the last 168 hours.  Cardiac Enzymes: No results for input(s): CKTOTAL, CKMB, CKMBINDEX, TROPONINI in the last 168 hours.  BNP: Invalid input(s): POCBNP  CBG: Recent Labs  Lab 07/11/18 1723 07/11/18 2153 07/12/18 0822 07/12/18 1214 07/12/18 1647  GLUCAP 198* 193* 195* 190* 177*    D-Dimer No results for input(s): DDIMER in the last 72 hours.  Hgb A1c Recent Labs    07/10/18 0150  HGBA1C 10.4*    Lipid Profile No results for input(s): CHOL, HDL, LDLCALC, TRIG, CHOLHDL, LDLDIRECT in the last 72 hours.  Thyroid function studies No results for input(s): TSH, T4TOTAL, T3FREE, THYROIDAB in the last 72 hours.  Invalid input(s): FREET3  Anemia work up No results for input(s): VITAMINB12, FOLATE, FERRITIN, TIBC, IRON, RETICCTPCT in the last 72 hours.  Urinalysis    Component Value Date/Time   COLORURINE YELLOW 07/12/2018 [place] 07/12/2018 1608  LABSPEC 1.010 07/12/2018 1608   PHURINE 6.0 07/12/2018 1608   GLUCOSEU NEGATIVE 07/12/2018 1608   HGBUR NEGATIVE 07/12/2018 [place] 07/12/2018 1608   KETONESUR NEGATIVE 07/12/2018 1608   PROTEINUR NEGATIVE 07/12/2018 1608   NITRITE NEGATIVE 07/12/2018 1608   LEUKOCYTESUR SMALL (A) 07/12/2018 1608    Sepsis Labs Lactic Acid, Venous    Component Value Date/Time   LATICACIDVEN 1.60 09/15/2017 1646    Microbiology No results found for this or any  previous visit (from the past 240 hour(s)).   Radiological Exams on Admission: Dg Chest Port 1 View  Result Date: 07/12/2018 CLINICAL DATA:  Decreased breath sounds at the left lung base on physical exam, history of CHF, diabetes, and asthma EXAM: PORTABLE CHEST 1 VIEW COMPARISON:  Chest x-ray of 06/23/2017 FINDINGS: No active infiltrate or effusion is seen. There is some opacity overlying the heart shadow at the left lung base which could represent atelectasis or possibly patchy pneumonia. Follow-up two-view chest x-ray is recommended. Cardiomegaly is stable and pacer and AICD leads remain. IMPRESSION: 1. Linear opacity at the left lung base may represent atelectasis or pneumonia. Recommend follow-up two-view chest x-ray. 2. Stable cardiomegaly with pacer and AICD leads. Electronically Signed   By: Ivar Drape M.D.   On: 07/12/2018 16:38    Inpatient Medications:   Anti-infectives (From admission, onward)   Start     Dose/Rate Route Frequency Ordered Stop   07/09/18 1730  ceFAZolin (ANCEF) IVPB 2g/100 mL premix     2 g 200 mL/hr over 30 Minutes Intravenous Every 8 hours 07/09/18 1424 07/09/18 1859   07/09/18 0645  ceFAZolin (ANCEF) IVPB 2g/100 mL premix     2 g 200 mL/hr over 30 Minutes Intravenous On call to O.R. 07/09/18 0488 07/09/18 0950     Scheduled Meds: . allopurinol  100 mg Oral Daily  . calcium-vitamin D  1 tablet Oral BID  . carvedilol  12.5 mg Oral BID WC  . DULoxetine  30 mg Oral Daily  . enoxaparin (LOVENOX) injection  30 mg Subcutaneous Q24H  . [START ON 07/13/2018] gabapentin  400 mg Oral QHS  . insulin aspart  0-20 Units Subcutaneous TID WC  . insulin aspart  0-5 Units Subcutaneous QHS  . insulin aspart  4 Units Subcutaneous TID WC  . insulin detemir  10 Units Subcutaneous BID  . levothyroxine  88 mcg Oral QAC breakfast  . montelukast  10 mg Oral QHS  . pantoprazole  40 mg Oral QPM  . rosuvastatin  10 mg Oral QHS  . torsemide  20 mg Oral Once   Continuous  Infusions: . sodium chloride 20 mL/hr at 07/11/18 0819    Total time spent 45 minutes.  Shanker Ghimire [facility] Pager [phone]  If 7PM-7AM, please contact night-coverage [email or web address] Password [email or web address] 07/12/2018, 5:13 PM

## 2018-07-12 NOTE — Progress Notes (Signed)
          SATURATION QUALIFICATIONS: (This note is used to comply with regulatory documentation for home oxygen)  Patient Saturations on Room Air at Rest = 83%  Patient Saturations on Room Air while Ambulating = 83%  Patient Saturations on 2 Liters of oxygen while Ambulating = 92%  Please briefly explain why patient needs home oxygen: Pt unable to maintain adequate oxygen saturation on RA and requires 2L supplemental O2 for safe ambulation.  Issaac Shipper B. Migdalia Dk PT, DPT Acute Rehabilitation Services Pager [phone] Office [phone]

## 2018-07-13 LAB — BASIC METABOLIC PANEL
Anion gap: 17 — ABNORMAL HIGH (ref 5–15)
BUN: 46 mg/dL — ABNORMAL HIGH (ref 8–23)
CALCIUM: 9.7 mg/dL (ref 8.9–10.3)
CO2: 25 mmol/L (ref 22–32)
Chloride: 93 mmol/L — ABNORMAL LOW (ref 98–111)
Creatinine, Ser: 1.75 mg/dL — ABNORMAL HIGH (ref 0.44–1.00)
GFR calc Af Amer: 34 mL/min — ABNORMAL LOW (ref >60)
GFR calc non Af Amer: 29 mL/min — ABNORMAL LOW (ref >60)
Glucose, Bld: 220 mg/dL — ABNORMAL HIGH (ref 70–99)
Potassium: 4.9 mmol/L (ref 3.5–5.1)
Sodium: 135 mmol/L (ref 135–145)

## 2018-07-13 LAB — CBC
HCT: 34.8 % — ABNORMAL LOW (ref 36.0–46.0)
Hemoglobin: 10.9 g/dL — ABNORMAL LOW (ref 12.0–15.0)
MCH: 25.6 pg — ABNORMAL LOW (ref 26.0–34.0)
MCHC: 31.3 g/dL (ref 30.0–36.0)
MCV: 81.9 fL (ref 80.0–100.0)
Platelets: 227 10*3/uL (ref 150–400)
RBC: 4.25 MIL/uL (ref 3.87–5.11)
RDW: 14.8 % (ref 11.5–15.5)
WBC: 9.1 10*3/uL (ref 4.0–10.5)
nRBC: 0 % (ref 0.0–0.2)

## 2018-07-13 LAB — GLUCOSE, CAPILLARY
Glucose-Capillary: 201 mg/dL — ABNORMAL HIGH (ref 70–99)
Glucose-Capillary: 176 mg/dL — ABNORMAL HIGH (ref 70–99)
Glucose-Capillary: 131 mg/dL — ABNORMAL HIGH (ref 70–99)
Glucose-Capillary: 194 mg/dL — ABNORMAL HIGH (ref 70–99)

## 2018-07-13 MED ORDER — LORAZEPAM 2 MG/ML IJ SOLN: 0.5000 mg | Freq: Four times a day (QID) | INTRAMUSCULAR | Status: DC | PRN

## 2018-07-13 MED ORDER — ONDANSETRON HCL 4 MG/2ML IJ SOLN
4.0000 mg | Freq: Four times a day (QID) | INTRAMUSCULAR | Status: DC | PRN
  Administered 2018-07-13 (×2): 4 mg via INTRAVENOUS
  Filled 2018-07-13 (×2): qty 2

## 2018-07-13 MED ORDER — HYDROMORPHONE HCL 1 MG/ML IJ SOLN: 0.5000 mg | INTRAMUSCULAR | Status: DC | PRN

## 2018-07-13 NOTE — Progress Notes (Signed)
[facility] Progress Note 4 Days Post-Op  Subjective: Patient very confused this AM, but able to redirect easily.  Confused, but not delirious.  Pulled out her IV.  Objective: Vital signs in last 24 hours: Temp:  [98.3 F (36.8 C)-99.1 F (37.3 C)] 98.8 F (37.1 C) (12/20 0558) Pulse Rate:  [74-83] 83 (12/20 0558) Resp:  [16-19] 17 (12/20 0558) BP: (103-140)/(60-85) 138/67 (12/20 0558) SpO2:  [94 %-100 %] 98 % (12/20 0558) Weight:  [98 kg] 98 kg (12/20 0500) Last BM Date: 07/12/18  Intake/Output from previous day: 12/19 0701 - 12/20 0700 In: 1017 [P.O.:1017] Out: -  Intake/Output this shift: No intake/output data recorded.  General: No pain or distress, just confused and agitated.  Lungs: Clear to auscultation, oxygen saturations 92% on room air.  Abd: Soft, decreased bowel sounds., wound is fine.  No drainage or infection.  Binder in place.  Vomited all over bed last night.  No  More bowel movements.  Extremities: No clinical signs or symptoms of DVT  Neuro: Confused  Lab Results:  @LABLAST2 (wbc:2,hgb:2,hct:2,plt:2) BMET ) Recent Labs    07/12/18 0549 07/13/18 0229  NA 133* 135  K 4.5 4.9  CL 93* 93*  CO2 27 25  GLUCOSE 208* 220*  BUN 53* 46*  CREATININE 2.33* 1.75*  CALCIUM 9.1 9.7   PT/INR No results for input(s): LABPROT, INR in the last 72 hours. ABG No results for input(s): PHART, HCO3 in the last 72 hours.  Invalid input(s): PCO2, PO2  Studies/Results: Dg Chest Port 1 View  Result Date: 07/12/2018 CLINICAL DATA:  Decreased breath sounds at the left lung base on physical exam, history of CHF, diabetes, and asthma EXAM: PORTABLE CHEST 1 VIEW COMPARISON:  Chest x-ray of 06/23/2017 FINDINGS: No active infiltrate or effusion is seen. There is some opacity overlying the heart shadow at the left lung base which could represent atelectasis or possibly patchy pneumonia. Follow-up two-view chest x-ray is recommended. Cardiomegaly is stable and pacer and AICD  leads remain. IMPRESSION: 1. Linear opacity at the left lung base may represent atelectasis or pneumonia. Recommend follow-up two-view chest x-ray. 2. Stable cardiomegaly with pacer and AICD leads. Electronically Signed   By: Ivar Drape M.D.   On: 07/12/2018 16:38    Anti-infectives: Anti-infectives (From admission, onward)   Start     Dose/Rate Route Frequency Ordered Stop   07/09/18 1730  ceFAZolin (ANCEF) IVPB 2g/100 mL premix     2 g 200 mL/hr over 30 Minutes Intravenous Every 8 hours 07/09/18 1424 07/09/18 1859   07/09/18 0645  ceFAZolin (ANCEF) IVPB 2g/100 mL premix     2 g 200 mL/hr over 30 Minutes Intravenous On call to O.R. 07/09/18 0644 07/09/18 0950      Assessment/Plan: s/p Procedure(s): EXPLORATION OF  ABDOMINAL WOUND ERAS PATHWAY SMALL BOWEL RESECTION INSERTION OF VICRYL MESH LYSIS OF ADHESION Stopped Nuerontins  Also had urinary retention and 1700cc bladder drainage.  Foley not kept in place.  Will rescan and may need to place Foley for urinary retention.  LOS: 4 days   Kathryne Eriksson. [name], MD, FACS [phone] [facility] 07/13/2018

## 2018-07-13 NOTE — Progress Notes (Signed)
Bladder scan resulted in 999 ML residual. Called Dr Grandville Silos for order to straight cath

## 2018-07-14 DIAGNOSIS — K632 Fistula of intestine: Secondary | ICD-10-CM

## 2018-07-14 LAB — CBC WITH DIFFERENTIAL/PLATELET
Abs Immature Granulocytes: 0.04 10*3/uL (ref 0.00–0.07)
Basophils Absolute: 0 10*3/uL (ref 0.0–0.1)
Basophils Relative: 0 %
Eosinophils Absolute: 0.2 10*3/uL (ref 0.0–0.5)
Eosinophils Relative: 3 %
HCT: 34 % — ABNORMAL LOW (ref 36.0–46.0)
Hemoglobin: 10.7 g/dL — ABNORMAL LOW (ref 12.0–15.0)
Immature Granulocytes: 1 %
Lymphocytes Relative: 16 %
Lymphs Abs: 1.3 10*3/uL (ref 0.7–4.0)
MCH: 26.2 pg (ref 26.0–34.0)
MCHC: 31.5 g/dL (ref 30.0–36.0)
MCV: 83.3 fL (ref 80.0–100.0)
MONOS PCT: 12 %
Monocytes Absolute: 1 10*3/uL (ref 0.1–1.0)
NEUTROS PCT: 68 %
Neutro Abs: 5.2 10*3/uL (ref 1.7–7.7)
Platelets: 244 10*3/uL (ref 150–400)
RBC: 4.08 MIL/uL (ref 3.87–5.11)
RDW: 15 % (ref 11.5–15.5)
WBC: 7.7 10*3/uL (ref 4.0–10.5)
nRBC: 0 % (ref 0.0–0.2)

## 2018-07-14 LAB — BASIC METABOLIC PANEL
Anion gap: 13 (ref 5–15)
BUN: 42 mg/dL — ABNORMAL HIGH (ref 8–23)
CO2: 31 mmol/L (ref 22–32)
Calcium: 9.8 mg/dL (ref 8.9–10.3)
Chloride: 93 mmol/L — ABNORMAL LOW (ref 98–111)
Creatinine, Ser: 1.79 mg/dL — ABNORMAL HIGH (ref 0.44–1.00)
GFR calc Af Amer: 33 mL/min — ABNORMAL LOW (ref >60)
GFR calc non Af Amer: 29 mL/min — ABNORMAL LOW (ref >60)
GLUCOSE: 198 mg/dL — AB (ref 70–99)
Potassium: 4.5 mmol/L (ref 3.5–5.1)
Sodium: 137 mmol/L (ref 135–145)

## 2018-07-14 LAB — GLUCOSE, CAPILLARY
Glucose-Capillary: 175 mg/dL — ABNORMAL HIGH (ref 70–99)
Glucose-Capillary: 210 mg/dL — ABNORMAL HIGH (ref 70–99)
Glucose-Capillary: 142 mg/dL — ABNORMAL HIGH (ref 70–99)
Glucose-Capillary: 126 mg/dL — ABNORMAL HIGH (ref 70–99)

## 2018-07-14 MED ORDER — GABAPENTIN 300 MG PO CAPS
300.0000 mg | ORAL_CAPSULE | Freq: Every day | ORAL | Status: DC
  Administered 2018-07-14: 300 mg via ORAL
  Filled 2018-07-14: qty 1

## 2018-07-14 NOTE — Progress Notes (Signed)
Bladder scan done at 0010.  Patient had 301 mL in bladder.  Received order to I/O cath patient.  Encouraged patient to try to sit on [facility].  She did and voided 350 mL on her own.

## 2018-07-14 NOTE — Progress Notes (Signed)
[facility] Progress Note  5 Days Post-Op  Subjective: CC-  Patient is awake and fully alert this morning. States that she feels better than yesterday. N/v x2 yesterday, but she tolerated dinner last night and has no n/v this morning. Passing flatus and had a BM last night. Has not eaten breakfast yet this morning. I&O cath x1 yesterday. Urinating without issues this morning.  Objective: Vital signs in last 24 hours: Temp:  [98.7 F (37.1 C)-99 F (37.2 C)] 98.7 F (37.1 C) (12/21 0530) Pulse Rate:  [60-76] 60 (12/21 0530) Resp:  [16-19] 19 (12/21 0530) BP: (124-133)/(61-74) 124/66 (12/21 0530) SpO2:  [95 %-99 %] 95 % (12/21 0530) Last BM Date: 07/12/18  Intake/Output from previous day: 12/20 0701 - 12/21 0700 In: 1422 [P.O.:1422] Out: 2297 [Urine:1350; Emesis/NG output:2] Intake/Output this shift: No intake/output data recorded.  PE: Gen:  Alert, NAD, pleasant HEENT: EOM's intact, pupils equal and round Card:  RRR Pulm:  CTAB, no W/R/R, effort normal Abd: Soft, protuberant, +BS, midline incision cdi with sutures intact Psych: A&Ox3  Skin: no rashes noted, warm and dry  Lab Results:  Recent Labs    07/13/18 0229 07/14/18 0424  WBC 9.1 7.7  HGB 10.9* 10.7*  HCT 34.8* 34.0*  PLT 227 244   BMET Recent Labs    07/13/18 0229 07/14/18 0424  NA 135 137  K 4.9 4.5  CL 93* 93*  CO2 25 31  GLUCOSE 220* 198*  BUN 46* 42*  CREATININE 1.75* 1.79*  CALCIUM 9.7 9.8   PT/INR No results for input(s): LABPROT, INR in the last 72 hours. CMP     Component Value Date/Time   NA 137 07/14/2018 0424   NA 140 06/11/2018 1116   K 4.5 07/14/2018 0424   CL 93 (L) 07/14/2018 0424   CO2 31 07/14/2018 0424   GLUCOSE 198 (H) 07/14/2018 0424   BUN 42 (H) 07/14/2018 0424   BUN 34 (H) 06/11/2018 1116   CREATININE 1.79 (H) 07/14/2018 0424   CREATININE 3.10 (H) 08/17/2017 1545   CALCIUM 9.8 07/14/2018 0424   PROT 8.5 (H) 09/15/2017 1629   ALBUMIN 4.2 09/15/2017  1629   AST 10 (A) 10/03/2017   ALT 11 10/03/2017   ALKPHOS 244 (A) 10/03/2017   BILITOT 1.1 09/15/2017 1629   GFRNONAA 29 (L) 07/14/2018 0424   GFRNONAA 15 (L) 08/17/2017 1545   GFRAA 33 (L) 07/14/2018 0424   GFRAA 17 (L) 08/17/2017 1545   Lipase     Component Value Date/Time   LIPASE 35 09/15/2017 1629       Studies/Results: Dg Chest Port 1 View  Result Date: 07/12/2018 CLINICAL DATA:  Decreased breath sounds at the left lung base on physical exam, history of CHF, diabetes, and asthma EXAM: PORTABLE CHEST 1 VIEW COMPARISON:  Chest x-ray of 06/23/2017 FINDINGS: No active infiltrate or effusion is seen. There is some opacity overlying the heart shadow at the left lung base which could represent atelectasis or possibly patchy pneumonia. Follow-up two-view chest x-ray is recommended. Cardiomegaly is stable and pacer and AICD leads remain. IMPRESSION: 1. Linear opacity at the left lung base may represent atelectasis or pneumonia. Recommend follow-up two-view chest x-ray. 2. Stable cardiomegaly with pacer and AICD leads. Electronically Signed   By: Ivar Drape M.D.   On: 07/12/2018 16:38    Anti-infectives: Anti-infectives (From admission, onward)   Start     Dose/Rate Route Frequency Ordered Stop   07/09/18 1730  ceFAZolin (ANCEF) IVPB 2g/100 mL  premix     2 g 200 mL/hr over 30 Minutes Intravenous Every 8 hours 07/09/18 1424 07/09/18 1859   07/09/18 0645  ceFAZolin (ANCEF) IVPB 2g/100 mL premix     2 g 200 mL/hr over 30 Minutes Intravenous On call to O.R. 07/09/18 3729 07/09/18 0950       Assessment/Plan AKI on CKD-III HF NICM DM-2 Hypothyroidism OSA  Non healing abdominal wound S/p EXPLORATION OF  ABDOMINAL WOUND ERAS PATHWAY, SMALL BOWEL RESECTION, INSERTION OF VICRYL MESH, LYSIS OF ADHESION 12/16 Dr. Hulen Skains - POD 5 - WBC WNL, VSS, afebrile - Seems to be doing better since stopping neurontin. No n/v since earlier yesterday. Continue carb modified diet. Ambulate and  continue PT.  ID - ancef periop FEN - CM diet VTE - SCDs, lovenox Foley - none    LOS: 5 days    Wellington Hampshire , [facility] 07/14/2018, 9:32 AM Pager: [phone] Mon 7:00 am -11:30 AM Tues-Fri 7:00 am-4:30 pm Sat-Sun 7:00 am-11:30 am

## 2018-07-14 NOTE — Progress Notes (Signed)
PROGRESS NOTE    Crystal [name]  WGY:[id number] DOB: 11-01-1950 DOA: 07/09/2018 PCP: Crist Infante, MD   Brief Narrative: Patient is a 68 year old female with past medical history of nonischemic cardiomyopathy, chronic systolic heart failure, OSA on CPAP, on home oxygen, hypothyroidism, diabetes type 2, nonhealing surgical wound on her anterior abdominal wall was admitted by general surgery for laparotomy due to concern for enterocutaneous fistula.  She underwent exploration of abdominal wound, small bowel resection and lysis of adhesions on 12/16.  Medicine was consulted due to delirium. This morning she was completely alert and oriented.  Gabapentin has been tapered.  Assessment & Plan:   Active Problems:   Enterocutaneous fistula   Waxing/waning confusion/delirium: Suspected delirium.  Significantly improved after stopping gabapentin.  She was taking gabapentin at home for neuropathy.  She is completely alert and oriented today.  We would recommend to resume gabapentin at 300 mg at bedtime.  CKD stage III: Her creatinine ranges anywhere from 1-2 upon reviewing her previous blood works.  Today creatinine is 1.7.  But kidney function has improved since she was admitted.  Acute kidney injury on presentation was most likely hemodynamically mediated.We will recommend to check kidney function as an outpatient in a week after discharge.  She follows with Dr. Florene Glen as an outpatient with nephrology.  Fever: Resolved.  Most likely from atelectasis.  Encourage incentive spirometry.  Chronic systolic heart failure: Ejection fraction 25 to 30% by TTE on 05/25/2018.  Currently she is euvolemic.  Continue home medications.  Resume Entresto on discharge. She has history of nonischemic cardiomyopathy.  Status post biventricular device implantation on 06/22/2018.  Follow-up with cardiology as an outpatient.  Diabetes type 2: Continue sliding his insulin and long-acting insulin for now.  Resume home regimen  on discharge.  Hypothyroidism :Continue Synthyroid  OSA/chronic hypoxic respiratory failure: On CPAP at home.  He is oxygen at night.  Thank you for for this consultation.  We will sign off for now.  Call as needed.         DVT prophylaxis:SCD Code Status: Full Family Communication: None Family member present at the bedside Disposition Plan: As per surgery.   Subjective: Patient seen and examined bedside this morning.  Remains comfortable.  Hemodynamically stable.  She is alert and oriented and is on baseline mental status.  Objective: Vitals:   07/13/18 1326 07/13/18 2231 07/14/18 0530 07/14/18 0948  BP: 130/74 133/61 124/66 (!) 126/52  Pulse: 76 71 60 71  Resp: 16 18 19    Temp: 99 F (37.2 C) 98.9 F (37.2 C) 98.7 F (37.1 C)   TempSrc: Oral Oral Oral   SpO2: 99% 98% 95%   Weight:      Height:        Intake/Output Summary (Last 24 hours) at 07/14/2018 1207 Last data filed at 07/14/2018 0106 Gross per 24 hour  Intake 720 ml  Output 852 ml  Net -132 ml   Filed Weights   07/09/18 0721 07/13/18 0500  Weight: 98.9 kg 98 kg    Examination:  General exam: Appears calm and comfortable ,Not in distress,obese HEENT:PERRL,Oral mucosa moist, Ear/Nose normal on gross exam Respiratory system: Bilateral equal air entry, normal vesicular breath sounds, no wheezes or crackles  Cardiovascular system: S1 & S2 heard, RRR. No JVD, murmurs, rubs, gallops or clicks. No pedal edema. Gastrointestinal system: Abdomen is nondistended, soft and nontender. Abdominal belt.  Clean Surgical wound Central nervous system: Alert and oriented. No focal neurological deficits. Extremities: No edema, no  clubbing ,no cyanosis, distal peripheral pulses palpable. Skin: No rashes, lesions or ulcers,no icterus ,no pallor Psychiatry: Judgement and insight appear normal. Mood & affect appropriate.     Data Reviewed: I have personally reviewed following labs and imaging studies  CBC: Recent  Labs  Lab 07/09/18 0711 07/10/18 0150 07/11/18 0312 07/12/18 0549 07/13/18 0229 07/14/18 0424  WBC 9.5 15.1* 12.8* 10.1 9.1 7.7  NEUTROABS 4.9  --   --  8.1*  --  5.2  HGB 12.9 12.0 10.7* 10.2* 10.9* 10.7*  HCT 42.0 41.3 34.5* 33.7* 34.8* 34.0*  MCV 82.4 85.0 85.2 83.0 81.9 83.3  PLT 216 210 125* 180 227 161   Basic Metabolic Panel: Recent Labs  Lab 07/10/18 0150 07/11/18 0312 07/12/18 0549 07/13/18 0229 07/14/18 0424  NA 137 132* 133* 135 137  K 4.6 5.2* 4.5 4.9 4.5  CL 98 98 93* 93* 93*  CO2 27 23 27 25 31   GLUCOSE 208* 130* 208* 220* 198*  BUN 49* 54* 53* 46* 42*  CREATININE 2.04* 2.77* 2.33* 1.75* 1.79*  CALCIUM 8.7* 8.4* 9.1 9.7 9.8   GFR: Estimated Creatinine Clearance: 35.5 mL/min (A) (by C-G formula based on SCr of 1.79 mg/dL (H)). Liver Function Tests: No results for input(s): AST, ALT, ALKPHOS, BILITOT, PROT, ALBUMIN in the last 168 hours. No results for input(s): LIPASE, AMYLASE in the last 168 hours. No results for input(s): AMMONIA in the last 168 hours. Coagulation Profile: No results for input(s): INR, PROTIME in the last 168 hours. Cardiac Enzymes: No results for input(s): CKTOTAL, CKMB, CKMBINDEX, TROPONINI in the last 168 hours. BNP (last 3 results) Recent Labs    05/23/18 1440  PROBNP 948*   HbA1C: No results for input(s): HGBA1C in the last 72 hours. CBG: Recent Labs  Lab 07/13/18 0756 07/13/18 1151 07/13/18 1731 07/13/18 2225 07/14/18 0808  GLUCAP 201* 176* 131* 194* 175*   Lipid Profile: No results for input(s): CHOL, HDL, LDLCALC, TRIG, CHOLHDL, LDLDIRECT in the last 72 hours. Thyroid Function Tests: No results for input(s): TSH, T4TOTAL, FREET4, T3FREE, THYROIDAB in the last 72 hours. Anemia Panel: No results for input(s): VITAMINB12, FOLATE, FERRITIN, TIBC, IRON, RETICCTPCT in the last 72 hours. Sepsis Labs: No results for input(s): PROCALCITON, LATICACIDVEN in the last 168 hours.  No results found for this or any previous  visit (from the past 240 hour(s)).       Radiology Studies: Dg Chest Port 1 View  Result Date: 07/12/2018 CLINICAL DATA:  Decreased breath sounds at the left lung base on physical exam, history of CHF, diabetes, and asthma EXAM: PORTABLE CHEST 1 VIEW COMPARISON:  Chest x-ray of 06/23/2017 FINDINGS: No active infiltrate or effusion is seen. There is some opacity overlying the heart shadow at the left lung base which could represent atelectasis or possibly patchy pneumonia. Follow-up two-view chest x-ray is recommended. Cardiomegaly is stable and pacer and AICD leads remain. IMPRESSION: 1. Linear opacity at the left lung base may represent atelectasis or pneumonia. Recommend follow-up two-view chest x-ray. 2. Stable cardiomegaly with pacer and AICD leads. Electronically Signed   By: Ivar Drape M.D.   On: 07/12/2018 16:38        Scheduled Meds: . allopurinol  100 mg Oral Daily  . calcium-vitamin D  1 tablet Oral BID  . carvedilol  12.5 mg Oral BID WC  . DULoxetine  30 mg Oral Daily  . enoxaparin (LOVENOX) injection  30 mg Subcutaneous Q24H  . gabapentin  300 mg Oral QHS  .  insulin aspart  0-20 Units Subcutaneous TID WC  . insulin aspart  0-5 Units Subcutaneous QHS  . insulin aspart  4 Units Subcutaneous TID WC  . insulin detemir  10 Units Subcutaneous BID  . levothyroxine  88 mcg Oral QAC breakfast  . montelukast  10 mg Oral QHS  . pantoprazole  40 mg Oral QPM  . rosuvastatin  10 mg Oral QHS   Continuous Infusions: . sodium chloride 20 mL/hr at 07/11/18 0819     LOS: 5 days    Time spent: 35 mins.More than 50% of that time was spent in counseling and/or coordination of care.      Shelly Coss, MD [facility] Pager [phone]  If 7PM-7AM, please contact night-coverage [email or web address] Password [facility] 07/14/2018, 12:07 PM

## 2018-07-14 NOTE — Progress Notes (Signed)
RT placed patient on CPAP with 2L bled into circuit. Patient tolerating well at this time. RT will monitor as needed.

## 2018-07-15 LAB — GLUCOSE, CAPILLARY
Glucose-Capillary: 162 mg/dL — ABNORMAL HIGH (ref 70–99)
Glucose-Capillary: 267 mg/dL — ABNORMAL HIGH (ref 70–99)

## 2018-07-15 MED ORDER — TRAMADOL HCL 50 MG PO TABS: 50.0000 mg | ORAL_TABLET | Freq: Four times a day (QID) | ORAL | 0 refills | Status: DC | PRN

## 2018-07-15 NOTE — Discharge Instructions (Signed)
Open Small Bowel Resection, Care After This sheet gives you information about how to care for yourself after your procedure. Your health care provider may also give you more specific instructions. If you have problems or questions, contact your health care provider. What can I expect after the procedure? After the procedure, it is common to have:  Pain in your abdomen, especially along your incision. You will be given pain medicines to control this.  Tiredness. This is a normal part of the recovery process. Your energy level will return to normal over the next several weeks.  Constipation. You may be given a stool softener to help prevent this. Follow these instructions at home: Medicines  Take over-the-counter and prescription medicines only as told by your health care provider.  Do not drive or use heavy machinery while taking prescription pain medicine.  If you were prescribed an antibiotic medicine, use it as told by your health care provider. Do not stop using the antibiotic even if you start to feel better. Activity  Return to your normal activities as told by your health care provider. Ask your health care provider what activities are safe for you.  Do not lift anything that is heavier than 10 lb (4.5 kg) until your health care provider says that it is safe.  Take frequent rest breaks during the day as needed.  Try to take short walks every day for the amount of time that your health care provider suggests.  Avoid activities that require a lot of effort (are strenuous) for as long as told by your health care provider. Incision care   Follow instructions from your health care provider about how to take care of your incision. Make sure you: ? Wash your hands with soap and water before you change your bandage (dressing). If soap and water are not available, use hand sanitizer. ? Change your dressing as told by your health care provider. ? Leave stitches (sutures), skin glue, or  adhesive strips in place. These skin closures may need to stay in place for 2 weeks or longer. If adhesive strip edges start to loosen and curl up, you may trim the loose edges. Do not remove adhesive strips completely unless your health care provider tells you to do that.  Check your incision area every day for signs of infection. Check for: ? More redness, swelling, or pain. ? More fluid or blood. ? Warmth. ? Pus or a bad smell.  Do not take baths, swim, or use a hot tub until your health care provider approves. Ask your health care provider if you can take showers. You may only be allowed to take sponge baths for bathing. General instructions  Continue to practice deep breathing and coughing. If it hurts to cough, try holding a pillow against your abdomen as you cough.  To prevent or treat constipation while you are taking prescription pain medicine, your health care provider may recommend that you: ? Drink enough fluid to keep your urine clear or pale yellow. ? Take over-the-counter or prescription medicines. ? Eat foods that are high in fiber, such as fresh fruits and vegetables, whole grains, and beans. ? Limit foods that are high in fat and processed sugars, such as fried and sweet foods.  Do not use any products that contain nicotine or tobacco as told by your health care provider. These include cigarettes and e-cigarettes. If you need help quitting, ask your health care provider.  Keep all follow-up visits as told by your health care  provider. This is important. Contact a health care provider if:  You have pain that is not relieved with medicine.  You do not feel like eating.  You feel nauseous or you vomit.  You have constipation that is not relieved with prescribed stool softeners.  You have more redness, swelling, or pain around your incision.  You have more fluid or blood coming from your incision.  Your incision feels warm to the touch.  You have pus or a bad smell  coming from your incision.  You have a fever. Get help right away if:  Your pain gets worse, even after you take pain medicine.  Your legs or arms hurt or become red or swollen.  You have chest pain.  You have trouble breathing. This information is not intended to replace advice given to you by your health care provider. Make sure you discuss any questions you have with your health care provider.  Kathryne Eriksson. [name], MD, [place] [phone] [facility]

## 2018-07-15 NOTE — Discharge Summary (Signed)
Physician Discharge Summary  Patient ID: Crystal [name] MRN: [id number] DOB/AGE: 1951/06/23 67 y.o.  Admit date: 07/09/2018 Discharge date: 07/15/2018  Admission Diagnoses:  Discharge Diagnoses:  Active Problems:   Enterocutaneous fistula   Discharged Condition: good  Hospital Course: The patient was admitted after a complicated wound expiration resulting in a segmental small bowel resection of 4 inches of the mid small bowel that was leading to a chronic enterocutaneous fistula.  Postoperatively she did fairly well for the first couple of days but then developed some delirium likely related to renal insufficiency which is chronic and buildup of some of her either pain medications or the gabapentin that she has been taking.  We got a medicine consultation who appropriately held several of her medications.  This along with monitoring and decreasing amount of pain medicine that she was getting led to improvement in the status to where she was oriented at the time of discharge.  She also had several episodes of nausea and vomiting on postop day #3 and 4 which improved with conservative management where she is eating and tolerating a soft diet prior to discharge.  She is also had several bowel movements which confirmed patency of her GI tract.  Her midline wound is slightly erythematous however it is closed with horizontal mattress nylon sutures with minimal to no drainage.  She is wearing an abdominal binder which she should keep in place for the next 6 weeks.  She has good active bowel sounds and has no abdominal tenderness.  Her last laboratory studies were within normal limits with the exception of the chemistry panel which demonstrates that she has had chronic renal insufficiency.  She is return to clinic to see my partner Dr. Georganna Skeans for wound care and follow-up.  She should remain on a soft diet.  Consults: Internal medicine  Significant Diagnostic Studies: labs: bmet and  cbc  Treatments: IV hydration, antibiotics: Ancef and perioperative, analgesia: acetaminophen, Dilaudid and oxycodone, cardiac meds: carvedilol and Demedex, insulin: regular and Lantus, respiratory therapy: O2 and albuterol/atropine nebulizer and Singulair  Discharge Exam: Blood pressure (!) 120/50, pulse 68, temperature 98.9 F (37.2 C), temperature source Oral, resp. rate 18, height 5\' 6"  (1.676 m), weight 100.9 kg, SpO2 98 %. General appearance: alert, cooperative, appears stated age and no distress Resp: clear to auscultation bilaterally GI: soft, non-tender; bowel sounds normal; no masses,  no organomegaly and Wound is clean and dry  Disposition: Discharge disposition: 01-Home or Self Care       Discharge Instructions    Call MD for:   Complete by:  As directed    Please make an appointment to see your primary care physician within the next week to see if he/she would like to resume any of her medications that have been held this admission   Call MD for:  difficulty breathing, headache or visual disturbances   Complete by:  As directed    Call MD for:  extreme fatigue   Complete by:  As directed    Call MD for:  hives   Complete by:  As directed    Call MD for:  persistant dizziness or light-headedness   Complete by:  As directed    Call MD for:  persistant nausea and vomiting   Complete by:  As directed    Call MD for:  redness, tenderness, or signs of infection (pain, swelling, redness, odor or green/yellow discharge around incision site)   Complete by:  As directed  Call MD for:  severe uncontrolled pain   Complete by:  As directed    Call MD for:  temperature >100.4   Complete by:  As directed    Diet - low sodium heart healthy   Complete by:  As directed    Discharge instructions   Complete by:  As directed    See instructions for bowel resection   Driving Restrictions   Complete by:  As directed    If patient drives, should not do so for the next two weeks    Increase activity slowly   Complete by:  As directed    Lifting restrictions   Complete by:  As directed    Nothing greater than 10 pounds for the next 6 weeks.  Wear abdominal binder for the next six weeks     Allergies as of 07/15/2018      Reactions   Ace Inhibitors Swelling, Other (See Comments)   Angioedema   Glucophage [metformin Hcl] Other (See Comments)   Renal failure   Telmisartan Other (See Comments)   AVOID ARB/ ACEi in this patient due to recurrent AKI   Advicor [niacin-lovastatin Er] Other (See Comments)   Muscle aches   Bystolic [nebivolol Hcl] Swelling, Other (See Comments)   UNSPECIFIED SWELLING/EDEMA   Erythromycin Diarrhea, Nausea And Vomiting, Other (See Comments)   *DERIVATIVES*   Lipitor [atorvastatin] Other (See Comments)   Muscle aches   Lopid [gemfibrozil] Other (See Comments)   Muscle aches   Statins Other (See Comments)   Muscle aches      Medication List    STOP taking these medications   gabapentin 100 MG capsule Commonly known as:  NEURONTIN   gabapentin 800 MG tablet Commonly known as:  NEURONTIN   OXYGEN   potassium chloride SA 20 MEQ tablet Commonly known as:  K-DUR,KLOR-CON   sacubitril-valsartan 24-26 MG Commonly known as:  ENTRESTO   torsemide 20 MG tablet Commonly known as:  DEMADEX     TAKE these medications   acetaminophen 500 MG tablet Commonly known as:  TYLENOL Take 1,000 mg by mouth every 6 (six) hours as needed for moderate pain or headache.   albuterol 108 (90 Base) MCG/ACT inhaler Commonly known as:  PROVENTIL HFA;VENTOLIN HFA Inhale 1-2 puffs into the lungs every 6 (six) hours as needed for wheezing or shortness of breath.   allopurinol 100 MG tablet Commonly known as:  ZYLOPRIM TAKE 1 TABLET(100 MG) BY MOUTH DAILY What changed:  See the new instructions.   CALTRATE 600+D 600-400 MG-UNIT tablet Generic drug:  Calcium Carbonate-Vitamin D Take 1 tablet by mouth 2 (two) times daily.   carvedilol 12.5 MG  tablet Commonly known as:  COREG Take 12.5 mg by mouth 2 (two) times daily with a meal.   CRESTOR 10 MG tablet Generic drug:  rosuvastatin Take 1 tablet (10 mg total) by mouth at bedtime.   diclofenac sodium 1 % Gel Commonly known as:  VOLTAREN Apply 2 g topically 4 (four) times daily as needed (for pain.).   DULoxetine 30 MG capsule Commonly known as:  CYMBALTA TAKE ONE CAPSULE BY MOUTH DAILY   Insulin Glulisine 100 UNIT/ML Solostar Pen Commonly known as:  APIDRA Inject 25 Units into the skin 3 (three) times daily.   levothyroxine 88 MCG tablet Commonly known as:  SYNTHROID, LEVOTHROID Take 88 mcg by mouth daily before breakfast.   loratadine 10 MG tablet Commonly known as:  CLARITIN Take 10 mg by mouth daily.   montelukast 10  MG tablet Commonly known as:  SINGULAIR Take 10 mg by mouth at bedtime.   omega-3 acid ethyl esters 1 g capsule Commonly known as:  LOVAZA Take 4 g by mouth at bedtime.   pantoprazole 40 MG tablet Commonly known as:  PROTONIX Take 40 mg by mouth every evening.   temazepam 15 MG capsule Commonly known as:  RESTORIL Take 1 capsule (15 mg total) by mouth at bedtime as needed for sleep.   TOUJEO MAX SOLOSTAR 300 UNIT/ML Sopn Generic drug:  Insulin Glargine Inject 110 Units into the skin every evening.   traMADol 50 MG tablet Commonly known as:  ULTRAM Take 1 tablet (50 mg total) by mouth every 6 (six) hours as needed for moderate pain or severe pain.      Follow-up Information    Georganna Skeans, MD. Schedule an appointment as soon as possible for a visit in 1 week(s).   Specialty:  General Surgery Why:  Patient to follow up with my partner for wound care and removal of sutures in about one week. Contact information: [place] [address] [phone]        Crist Infante, MD. Schedule an appointment as soon as possible for a visit in 1 week(s).   Specialty:  Internal Medicine Why:  Several of h er  medications were held during her recent hospitalization and she should have her entire list reviewed.  She told me that she had not been taking her Entresto prior to admission. Contact information: [place] [address] [phone]        Jettie Booze, MD .   Specialties:  Cardiology, Radiology, Interventional Cardiology Contact information: [address] [place] [address] [phone]           Signed: Judeth Horn 07/15/2018, 7:56 AM

## 2018-07-19 ENCOUNTER — Other Ambulatory Visit: Payer: Self-pay | Admitting: Internal Medicine

## 2018-09-05 ENCOUNTER — Encounter: Payer: Self-pay | Admitting: Family

## 2018-09-06 ENCOUNTER — Encounter: Payer: Self-pay | Admitting: Internal Medicine

## 2018-09-24 ENCOUNTER — Ambulatory Visit (INDEPENDENT_AMBULATORY_CARE_PROVIDER_SITE_OTHER): Payer: Medicare Other | Admitting: Internal Medicine

## 2018-09-24 ENCOUNTER — Encounter: Payer: Self-pay | Admitting: Internal Medicine

## 2018-09-24 VITALS — BP 110/60 | HR 87 | Ht 66.0 in | Wt 221.8 lb

## 2018-09-24 DIAGNOSIS — Z95810 Presence of automatic (implantable) cardiac defibrillator: Secondary | ICD-10-CM

## 2018-09-24 DIAGNOSIS — I5022 Chronic systolic (congestive) heart failure: Secondary | ICD-10-CM

## 2018-09-24 DIAGNOSIS — I447 Left bundle-branch block, unspecified: Secondary | ICD-10-CM

## 2018-09-24 DIAGNOSIS — D649 Anemia, unspecified: Secondary | ICD-10-CM | POA: Diagnosis not present

## 2018-09-24 DIAGNOSIS — I428 Other cardiomyopathies: Secondary | ICD-10-CM

## 2018-09-24 LAB — CBC
Hematocrit: 37 % (ref 34.0–46.6)
Hemoglobin: 11.9 g/dL (ref 11.1–15.9)
MCH: 26.9 pg (ref 26.6–33.0)
MCHC: 32.2 g/dL (ref 31.5–35.7)
MCV: 84 fL (ref 79–97)
PLATELETS: 208 10*3/uL (ref 150–450)
RBC: 4.43 x10E6/uL (ref 3.77–5.28)
RDW: 15.8 % — AB (ref 11.7–15.4)
WBC: 8.1 10*3/uL (ref 3.4–10.8)

## 2018-09-24 LAB — BASIC METABOLIC PANEL
BUN/Creatinine Ratio: 27 (ref 12–28)
BUN: 48 mg/dL — ABNORMAL HIGH (ref 8–27)
CO2: 23 mmol/L (ref 20–29)
Calcium: 10.4 mg/dL — ABNORMAL HIGH (ref 8.7–10.3)
Chloride: 96 mmol/L (ref 96–106)
Creatinine, Ser: 1.76 mg/dL — ABNORMAL HIGH (ref 0.57–1.00)
GFR calc Af Amer: 34 mL/min/{1.73_m2} — ABNORMAL LOW (ref >59)
GFR calc non Af Amer: 29 mL/min/{1.73_m2} — ABNORMAL LOW (ref >59)
Glucose: 214 mg/dL — ABNORMAL HIGH (ref 65–99)
POTASSIUM: 5 mmol/L (ref 3.5–5.2)
SODIUM: 137 mmol/L (ref 134–144)

## 2018-09-24 LAB — IRON AND TIBC
Iron Saturation: 12 % — ABNORMAL LOW (ref 15–55)
Iron: 48 ug/dL (ref 27–139)
Total Iron Binding Capacity: 398 ug/dL (ref 250–450)
UIBC: 350 ug/dL (ref 118–369)

## 2018-09-24 MED ORDER — SPIRONOLACTONE 25 MG PO TABS: 12.5000 mg | ORAL_TABLET | Freq: Every day | ORAL | 3 refills | Status: DC

## 2018-09-24 NOTE — Patient Instructions (Signed)
Medication Instructions:  Your physician has recommended you make the following change in your medication:   1. Begin taking Aldactone, 12.5mg  tablet, once daily.  Labwork: You will have labs drawn today: CBC, BMP, Fe and TIBC  Your physician recommends that you return for lab work in: 2 weeks for a BMP   Testing/Procedures: Your physician has requested that you have an echocardiogram. Echocardiography is a painless test that uses sound waves to create images of your heart. It provides your doctor with information about the size and shape of your heart and how well your heart's chambers and valves are working. This procedure takes approximately one hour. There are no restrictions for this procedure.   Follow-Up: Your physician recommends that you schedule a follow-up appointment in:   Dr Irish Lack in 8 weeks  Dr Caryl Comes in 9 months  Any Other Special Instructions Will Be Listed Below (If Applicable).     If you need a refill on your cardiac medications before your next appointment, please call your pharmacy.

## 2018-09-24 NOTE — Progress Notes (Signed)
Patient Care Team: Crist Infante, MD as PCP - General (Internal Medicine) Jettie Booze, MD as PCP - Cardiology (Cardiology) Phineas Inches, MD as Consulting Physician (Nephrology) Judeth Horn, MD as Consulting Physician (General Surgery)   HPI  Crystal [name] is a 68 y.o. female Seen following CRT upgrade and revision 11/19 for nonischemic cardiomyopathy left bundle branch block and congestive heart failure.  Previously implanted ICD lead had a threshold of greater than 5.  A new lead was implanted; the previously implanted epicardial LV lead also had exuberant threshold and an LV lead was placed   Multiple ECGs were reviewed from 2017--2019.  Even with rates as low as 75 left bundle branch block was present ECG 10/19 demonstrated recurrent left bundle branch block  DATE TEST EF   2005 Echo   25 %   2005 LHC  No obstructive CAD  10/15 Echo  50-55%   2018 Echo  50%   11/19 Echo  25-30%   11/19 LHC  No obstructive CAD   She was seen by Dr. Saundra Shelling 10/19 with symptoms of worsening heart failure.  Echocardiogram demonstrated EF as above with subsequent catheterization.   Date Cr K Hgb  12/19 1.79 4.5 10.7          Shortness of breath is much improved following CRT implant.  However, of late she has had more problems with dyspnea again.  No significant edema.  No chest pain.  No palpitations.  CPAP wearing  And monitored also supplemental nocturnal oxygen   Records and Results Reviewed  Past Medical History:  Diagnosis Date  . AICD (automatic cardioverter/defibrillator) present    high RV threshold chronically, device was turned off in 2014; Device battery has been dead x 7 years; "turned it back on 06/22/2018"  . Anemia, iron deficiency   . Angioedema    felt to likely be due to ace inhibitors but says she has had this even off of medicines, appears to be tolerating ARBs chronically  . Anxiety   . Arthritis    "hands, legs, arms; bad in my back"  (06/22/2018)  . Asthmatic bronchitis with status asthmaticus   . Bradycardia   . CHF (congestive heart failure) ([place])   . Chronic lower back pain   . Chronic pain   . Chronic renal insufficiency   . CKD (chronic kidney disease), stage III ([place])   . Coronary artery disease    Mild nonobstructive (30% LAD, 30% RCA) by 09/01/16 cath at [facility]  . Degenerative joint disease   . Depression   . Diabetes mellitus, type 2 ([place])   . Diabetic peripheral neuropathy ([place])   . Dizziness   . Dyspnea on exertion   . Family history of adverse reaction to anesthesia    Mother has nausea  . GERD (gastroesophageal reflux disease)   . Gout   . Heart valve disorder   . History of blood transfusion 1981; ~ 2005; 12/2016   "childbirth; defibrillator OR; colostomy OR"  . History of mononucleosis 03/2014  . Hyperlipidemia   . Hypertension   . Hypothyroid   . Insomnia   . Intervertebral disc degeneration   . Ischemic cardiomyopathy   . LBBB (left bundle branch block)   . Myocardial infarction ([place]) dx'd ~ 2005  . Nonischemic cardiomyopathy ([place])    s/p ICD in 2005. EF has since recovered  . Obesity   . On home oxygen therapy    "2L at night" (06/22/2018)  .  OSA on CPAP   . Pneumonia    "couple times; last time was 12/2016" (06/22/2018)  . PONV (postoperative nausea and vomiting)   . Presence of permanent cardiac pacemaker    [employer]  . Swelling   . Syncope   . Systemic hypertension   . Vitamin D deficiency     Past Surgical History:  Procedure Laterality Date  . APPENDECTOMY  07/13/2017  . BLADDER SUSPENSION  1990   "w/hysterectomy"  . BOWEL RESECTION N/A 07/09/2018   Procedure: SMALL BOWEL RESECTION;  Surgeon: Judeth Horn, MD;  Location: [facility];  Service: General;  Laterality: N/A;  . CARDIAC CATHETERIZATION  2005   no obstructive CAD per patient  . CARDIAC CATHETERIZATION  2018  . CARDIAC DEFIBRILLATOR PLACEMENT  2005   BiV ICD implanted,  LV lead is an epicardial  lead  . CARPAL TUNNEL RELEASE Left 07/25/2014   Dr.Williamson   . CATARACT EXTRACTION W/ INTRAOCULAR LENS  IMPLANT, BILATERAL Bilateral   . COLONIC STENT PLACEMENT N/A 08/31/2017   Procedure: COLONIC STENT PLACEMENT;  Surgeon: Carol Ada, MD;  Location: [facility];  Service: Endoscopy;  Laterality: N/A;  . COLONOSCOPY     2010-2011 Dr.Kipreos   . COLOSTOMY  12/2016   Archie Endo 01/26/2017  . COLOSTOMY REVERSAL N/A 07/13/2017   Procedure: COLOSTOMY REVERSAL;  Surgeon: Judeth Horn, MD;  Location: [facility];  Service: General;  Laterality: N/A;  . CYST REMOVAL HAND Right 05/2013   thumb  . DILATION AND CURETTAGE OF UTERUS  X 5-6  . EXCISION MASS ABDOMINAL N/A 07/09/2018   Procedure: EXPLORATION OF  ABDOMINAL WOUND ERAS PATHWAY;  Surgeon: Judeth Horn, MD;  Location: [facility];  Service: General;  Laterality: N/A;  . FLEXIBLE SIGMOIDOSCOPY N/A 08/24/2017   Procedure: Beryle Quant;  Surgeon: Carol Ada, MD;  Location: [facility];  Service: Endoscopy;  Laterality: N/A;  . FLEXIBLE SIGMOIDOSCOPY N/A 08/31/2017   Procedure: FLEXIBLE SIGMOIDOSCOPY;  Surgeon: Carol Ada, MD;  Location: [facility];  Service: Endoscopy;  Laterality: N/A;  stent placement  . IMPLANTABLE CARDIOVERTER DEFIBRILLATOR GENERATOR CHANGE  2008  . INSERTION OF MESH N/A 07/09/2018   Procedure: INSERTION OF VICRYL MESH;  Surgeon: Judeth Horn, MD;  Location: [facility];  Service: General;  Laterality: N/A;  . LAPAROTOMY N/A 09/18/2017   Procedure: EXPLORATORY LAPAROTOMY;  Surgeon: Judeth Horn, MD;  Location: [facility];  Service: General;  Laterality: N/A;  . LEAD REVISION  06/22/2018  . LEAD REVISION/REPAIR N/A 06/22/2018   Procedure: LEAD REVISION/REPAIR;  Surgeon: Deboraha Sprang, MD;  Location: [facility];  Service: Cardiovascular;  Laterality: N/A;  . LYSIS OF ADHESION N/A 09/18/2017   Procedure: LYSIS OF ADHESION;  Surgeon: Judeth Horn, MD;  Location: [facility];  Service: General;  Laterality: N/A;  . LYSIS OF ADHESION  N/A 07/09/2018   Procedure: LYSIS OF ADHESION;  Surgeon: Judeth Horn, MD;  Location: [facility];  Service: General;  Laterality: N/A;  . PARTIAL COLECTOMY N/A 09/18/2017   Procedure: ILEOCOLECTOMY;  Surgeon: Judeth Horn, MD;  Location: [facility];  Service: General;  Laterality: N/A;  . PARTIAL COLECTOMY  09/25/2017  . RIGHT/LEFT HEART CATH AND CORONARY ANGIOGRAPHY N/A 06/01/2018   Procedure: RIGHT/LEFT HEART CATH AND CORONARY ANGIOGRAPHY;  Surgeon: Jettie Booze, MD;  Location: [facility];  Service: Cardiovascular;  Laterality: N/A;  . SIGMOIDOSCOPY N/A 09/18/2017   Procedure: SIGMOIDOSCOPY;  Surgeon: Judeth Horn, MD;  Location: [facility];  Service: General;  Laterality: N/A;  . SMALL INTESTINE  SURGERY  07/09/2018   EXPLORATION OF ABDOMINAL WOUND ERAS PATHWAYN/; MESH; LYSIS OF ADHESIONS  . TOTAL ABDOMINAL HYSTERECTOMY  1990  . TRIGGER FINGER RELEASE Right 05/213  . TUBAL LIGATION  1980s  . VESICOVAGINAL FISTULA CLOSURE W/ TAH      Current Meds  Medication Sig  . acetaminophen (TYLENOL) 500 MG tablet Take 1,000 mg by mouth every 6 (six) hours as needed for moderate pain or headache.  . albuterol (PROVENTIL HFA;VENTOLIN HFA) 108 (90 Base) MCG/ACT inhaler Inhale 1-2 puffs into the lungs every 6 (six) hours as needed for wheezing or shortness of breath.  . allopurinol (ZYLOPRIM) 100 MG tablet TAKE 1 TABLET(100 MG) BY MOUTH DAILY  . Calcium Carbonate-Vitamin D (CALTRATE 600+D) 600-400 MG-UNIT tablet Take 1 tablet by mouth 2 (two) times daily.  . carvedilol (COREG) 12.5 MG tablet Take 12.5 mg by mouth 2 (two) times daily with a meal.  . CRESTOR 10 MG tablet Take 1 tablet (10 mg total) by mouth at bedtime.  . diclofenac sodium (VOLTAREN) 1 % GEL Apply 2 g topically 4 (four) times daily as needed (for pain.).   Marland Kitchen DULoxetine (CYMBALTA) 30 MG capsule TAKE ONE CAPSULE BY MOUTH DAILY  . Insulin Glargine (TOUJEO MAX SOLOSTAR) 300 UNIT/ML SOPN Inject 130 Units into the skin every evening.   .  insulin glulisine (APIDRA) 100 UNIT/ML injection Inject 35 Units into the skin 3 (three) times daily before meals.  Marland Kitchen levothyroxine (SYNTHROID, LEVOTHROID) 88 MCG tablet Take 88 mcg by mouth daily before breakfast.   . loratadine (CLARITIN) 10 MG tablet Take 10 mg by mouth daily.  . montelukast (SINGULAIR) 10 MG tablet Take 10 mg by mouth at bedtime.  Marland Kitchen omega-3 acid ethyl esters (LOVAZA) 1 g capsule Take 4 g by mouth at bedtime.   . pantoprazole (PROTONIX) 40 MG tablet Take 40 mg by mouth every evening.  . temazepam (RESTORIL) 15 MG capsule Take 1 capsule (15 mg total) by mouth at bedtime as needed for sleep.  . traMADol (ULTRAM) 50 MG tablet Take 1 tablet (50 mg total) by mouth every 6 (six) hours as needed for moderate pain or severe pain.    Allergies  Allergen Reactions  . Ace Inhibitors Swelling and Other (See Comments)    Angioedema  . Glucophage [Metformin Hcl] Other (See Comments)    Renal failure  . Telmisartan Other (See Comments)    AVOID ARB/ ACEi in this patient due to recurrent AKI  . Advicor [Niacin-Lovastatin Er] Other (See Comments)    Muscle aches  . Bystolic [Nebivolol Hcl] Swelling and Other (See Comments)    UNSPECIFIED SWELLING/EDEMA  . Erythromycin Diarrhea, Nausea And Vomiting and Other (See Comments)    *DERIVATIVES*  . Lipitor [Atorvastatin] Other (See Comments)    Muscle aches  . Lopid [Gemfibrozil] Other (See Comments)    Muscle aches  . Statins Other (See Comments)    Muscle aches      Review of Systems negative except from HPI and PMH  Physical Exam BP 110/60   Pulse 87   Ht 5\' 6"  (1.676 m)   Wt 221 lb 12.8 oz (100.6 kg)   SpO2 94%   BMI 35.80 kg/m  Well developed and Morbidly obese in no acute distress HENT normal Neck supple with JVP-7-8 Clear Device pocket well healed; without hematoma or erythema.  There is no tethering  Regular rate and rhythm, no  gallop No murmur Abd-soft with active BS No Clubbing cyanosis tr edema Skin-warm  and  dry A & Oriented  Grossly normal sensory and motor function  ECG demonstrates sinus rhythm with P synchronous pacing with an upright QRS lead V1 and a qR in lead I     Assessment and Plan:  Nonischemic cardiomyopathy-recurrent  Left bundle branch block-recurrent  Congestive heart failure- chronic systolic  Morbid obesity  Atrial tachycardia recurrent on 12/12 and 12/14    CRT-D-[employer]- RV lead failure newly implanted RV lead and LV lead  The patient's device was interrogated and the information was fully reviewed.  The device was reprogrammed to increase number of ATP in the VT zone eliminating ramp.  Duration was increased from 5--10 seconds given her atrial tachycardia  Renal insufficiency grade 3  ACE inhibitor allergy-angioedema  Anemia  Atrial tachycardia was initially detected as atrial in origin and therapy withheld by R ID; however, at the end it failed--this feature is not modifiable.  Volume status is much improved.  She is largely euvolemic.  Blood pressure is well controlled.  Salt intake is excessive and we have reviewed the importance of salt restriction  With her LV dysfunction, we will begin her on Aldactone; have reviewed the risks related to hypokalemia and will check her blood work in 2 weeks time.  We will check iron labs given her chronic anemia  More than 50% of 40 min was spent in counseling related to the above   Virl Axe

## 2018-10-01 ENCOUNTER — Ambulatory Visit (HOSPITAL_COMMUNITY): Payer: Medicare Other | Attending: Cardiology

## 2018-10-01 DIAGNOSIS — Z95810 Presence of automatic (implantable) cardiac defibrillator: Secondary | ICD-10-CM

## 2018-10-01 DIAGNOSIS — I5022 Chronic systolic (congestive) heart failure: Secondary | ICD-10-CM | POA: Diagnosis not present

## 2018-10-01 DIAGNOSIS — D649 Anemia, unspecified: Secondary | ICD-10-CM | POA: Insufficient documentation

## 2018-10-01 DIAGNOSIS — I428 Other cardiomyopathies: Secondary | ICD-10-CM | POA: Diagnosis not present

## 2018-10-01 DIAGNOSIS — I447 Left bundle-branch block, unspecified: Secondary | ICD-10-CM | POA: Insufficient documentation

## 2018-10-03 LAB — CUP PACEART INCLINIC DEVICE CHECK
Brady Statistic RA Percent Paced: 1 % — CL
Brady Statistic RV Percent Paced: 98 %
Date Time Interrogation Session: 20200302050000
HIGH POWER IMPEDANCE MEASURED VALUE: 72 Ohm
Implantable Lead Implant Date: 20191129
Implantable Lead Implant Date: 20191129
Implantable Lead Implant Date: 20191129
Implantable Lead Location: 753858
Implantable Lead Location: 753859
Implantable Lead Location: 753860
Implantable Lead Model: 293
Implantable Lead Model: 4086
Implantable Lead Serial Number: 226691
Implantable Lead Serial Number: 440868
Lead Channel Impedance Value: 508 Ohm
Lead Channel Impedance Value: 655 Ohm
Lead Channel Impedance Value: 689 Ohm
Lead Channel Pacing Threshold Amplitude: 0.5 V
Lead Channel Pacing Threshold Amplitude: 1.1 V
Lead Channel Pacing Threshold Amplitude: 1.4 V
Lead Channel Pacing Threshold Pulse Width: 0.4 ms
Lead Channel Pacing Threshold Pulse Width: 0.4 ms
Lead Channel Pacing Threshold Pulse Width: 1 ms
Lead Channel Sensing Intrinsic Amplitude: 18.1 mV
Lead Channel Sensing Intrinsic Amplitude: 2.9 mV
Lead Channel Sensing Intrinsic Amplitude: 25 mV
Lead Channel Setting Pacing Amplitude: 2 V
Lead Channel Setting Pacing Amplitude: 2 V
Lead Channel Setting Pacing Pulse Width: 1 ms
Lead Channel Setting Sensing Sensitivity: 0.5 mV
Lead Channel Setting Sensing Sensitivity: 1 mV
MDC IDC PG IMPLANT DT: 20191129
MDC IDC SET LEADCHNL RA PACING AMPLITUDE: 2.5 V
MDC IDC SET LEADCHNL RV PACING PULSEWIDTH: 0.4 ms
Pulse Gen Serial Number: 227627

## 2018-10-08 ENCOUNTER — Other Ambulatory Visit: Payer: Medicare Other | Admitting: *Deleted

## 2018-10-08 ENCOUNTER — Other Ambulatory Visit: Payer: Self-pay

## 2018-10-08 DIAGNOSIS — Z95810 Presence of automatic (implantable) cardiac defibrillator: Secondary | ICD-10-CM | POA: Diagnosis not present

## 2018-10-08 DIAGNOSIS — I5022 Chronic systolic (congestive) heart failure: Secondary | ICD-10-CM | POA: Diagnosis not present

## 2018-10-08 DIAGNOSIS — I428 Other cardiomyopathies: Secondary | ICD-10-CM | POA: Diagnosis not present

## 2018-10-08 DIAGNOSIS — D649 Anemia, unspecified: Secondary | ICD-10-CM

## 2018-10-08 DIAGNOSIS — I447 Left bundle-branch block, unspecified: Secondary | ICD-10-CM | POA: Diagnosis not present

## 2018-10-09 LAB — BASIC METABOLIC PANEL
BUN/Creatinine Ratio: 30 — ABNORMAL HIGH (ref 12–28)
BUN: 55 mg/dL — ABNORMAL HIGH (ref 8–27)
CHLORIDE: 97 mmol/L (ref 96–106)
CO2: 23 mmol/L (ref 20–29)
Calcium: 9.5 mg/dL (ref 8.7–10.3)
Creatinine, Ser: 1.86 mg/dL — ABNORMAL HIGH (ref 0.57–1.00)
GFR calc Af Amer: 32 mL/min/{1.73_m2} — ABNORMAL LOW (ref >59)
GFR calc non Af Amer: 27 mL/min/{1.73_m2} — ABNORMAL LOW (ref >59)
Glucose: 250 mg/dL — ABNORMAL HIGH (ref 65–99)
Potassium: 4.7 mmol/L (ref 3.5–5.2)
Sodium: 135 mmol/L (ref 134–144)

## 2018-10-10 ENCOUNTER — Telehealth: Payer: Self-pay

## 2018-10-10 DIAGNOSIS — N183 Chronic kidney disease, stage 3 (moderate): Secondary | ICD-10-CM | POA: Diagnosis not present

## 2018-10-10 DIAGNOSIS — M109 Gout, unspecified: Secondary | ICD-10-CM | POA: Diagnosis not present

## 2018-10-10 DIAGNOSIS — E7849 Other hyperlipidemia: Secondary | ICD-10-CM | POA: Diagnosis not present

## 2018-10-10 DIAGNOSIS — Z95810 Presence of automatic (implantable) cardiac defibrillator: Secondary | ICD-10-CM | POA: Diagnosis not present

## 2018-10-10 DIAGNOSIS — G608 Other hereditary and idiopathic neuropathies: Secondary | ICD-10-CM | POA: Diagnosis not present

## 2018-10-10 DIAGNOSIS — I509 Heart failure, unspecified: Secondary | ICD-10-CM | POA: Diagnosis not present

## 2018-10-10 DIAGNOSIS — D6489 Other specified anemias: Secondary | ICD-10-CM | POA: Diagnosis not present

## 2018-10-10 DIAGNOSIS — I10 Essential (primary) hypertension: Secondary | ICD-10-CM | POA: Diagnosis not present

## 2018-10-10 DIAGNOSIS — K219 Gastro-esophageal reflux disease without esophagitis: Secondary | ICD-10-CM | POA: Diagnosis not present

## 2018-10-10 DIAGNOSIS — G4733 Obstructive sleep apnea (adult) (pediatric): Secondary | ICD-10-CM | POA: Diagnosis not present

## 2018-10-10 DIAGNOSIS — E1129 Type 2 diabetes mellitus with other diabetic kidney complication: Secondary | ICD-10-CM | POA: Diagnosis not present

## 2018-10-10 DIAGNOSIS — M5489 Other dorsalgia: Secondary | ICD-10-CM | POA: Diagnosis not present

## 2018-10-10 NOTE — Telephone Encounter (Signed)
-----   Message from Deboraha Sprang, MD sent at 10/10/2018  2:57 PM EDT ----- Please Inform Patient  Labs are normal K is within range x Cr is up   Her MAR does not have her taking a diuretic?  Could you clarify please   Thanks

## 2018-10-10 NOTE — Telephone Encounter (Signed)
Pt advised lab results and reports that she is taking:  Demadex 20mg  a day Spironolactone 12.5mg  a day  Advised pt to keep on the same meds and will let her know if any changes.

## 2018-10-12 NOTE — Telephone Encounter (Signed)
thxn

## 2018-10-15 ENCOUNTER — Encounter: Payer: Medicare Other | Admitting: Family

## 2018-10-15 ENCOUNTER — Ambulatory Visit: Payer: Self-pay

## 2018-10-15 ENCOUNTER — Other Ambulatory Visit: Payer: Self-pay

## 2018-10-15 DIAGNOSIS — F32A Depression, unspecified: Secondary | ICD-10-CM

## 2018-10-15 DIAGNOSIS — F329 Major depressive disorder, single episode, unspecified: Secondary | ICD-10-CM

## 2018-10-15 MED ORDER — DULOXETINE HCL 30 MG PO CPEP: 30.0000 mg | ORAL_CAPSULE | Freq: Every day | ORAL | 0 refills | Status: DC

## 2018-10-15 NOTE — Telephone Encounter (Signed)
Received fax request from [facility]. Patient had appointment scheduled for 10/16/18 but she cancelled.  No other appointment at this time.  Patient also takes tramadol, which could raise serotonin level.  Request completed and forwarded to [place], as she was scheduled to see her on 10/16/18. She is a former patient of Dr. Eulas Post.

## 2018-10-15 NOTE — Telephone Encounter (Signed)
Patient has not been seen since 04/2018 and has had three hospital admission.Need to schedule appointment or evaluate if appropriate for Teleconference visit.Will not be able to refill further medication without visit.

## 2018-10-16 ENCOUNTER — Encounter: Payer: Medicare Other | Admitting: Family

## 2018-11-16 ENCOUNTER — Telehealth: Payer: Self-pay | Admitting: Interventional Cardiology

## 2018-11-16 NOTE — Telephone Encounter (Signed)
Follow Up:     Pt returning your call.

## 2018-11-16 NOTE — Telephone Encounter (Signed)
Virtual Visit Pre-Appointment Phone Call   TELEPHONE CALL NOTE  Crystal [name] has been deemed a candidate for a follow-up tele-health visit to limit community exposure during the Covid-19 pandemic. I spoke with the patient via phone to ensure availability of phone/video source, confirm preferred email & phone number, and discuss instructions and expectations.  I reminded Crystal [name] to be prepared with any vital sign and/or heart rhythm information that could potentially be obtained via home monitoring, at the time of her visit. I reminded Crystal [name] to expect a phone call prior to her visit.  Patient agrees to consent below.  Cleon Gustin, RN 11/16/2018 3:17 PM   FULL LENGTH CONSENT FOR TELE-HEALTH VISIT   I hereby voluntarily request, consent and authorize [facility] and its employed or contracted physicians, physician assistants, nurse practitioners or other licensed health care professionals (the Practitioner), to provide me with telemedicine health care services (the "Services") as deemed necessary by the treating Practitioner. I acknowledge and consent to receive the Services by the Practitioner via telemedicine. I understand that the telemedicine visit will involve communicating with the Practitioner through live audiovisual communication technology and the disclosure of certain medical information by electronic transmission. I acknowledge that I have been given the opportunity to request an in-person assessment or other available alternative prior to the telemedicine visit and am voluntarily participating in the telemedicine visit.  I understand that I have the right to withhold or withdraw my consent to the use of telemedicine in the course of my care at any time, without affecting my right to future care or treatment, and that the Practitioner or I may terminate the telemedicine visit at any time. I understand that I have the right to inspect all information obtained  and/or recorded in the course of the telemedicine visit and may receive copies of available information for a reasonable fee.  I understand that some of the potential risks of receiving the Services via telemedicine include:  Marland Kitchen Delay or interruption in medical evaluation due to technological equipment failure or disruption; . Information transmitted may not be sufficient (e.g. poor resolution of images) to allow for appropriate medical decision making by the Practitioner; and/or  . In rare instances, security protocols could fail, causing a breach of personal health information.  Furthermore, I acknowledge that it is my responsibility to provide information about my medical history, conditions and care that is complete and accurate to the best of my ability. I acknowledge that Practitioner's advice, recommendations, and/or decision may be based on factors not within their control, such as incomplete or inaccurate data provided by me or distortions of diagnostic images or specimens that may result from electronic transmissions. I understand that the practice of medicine is not an exact science and that Practitioner makes no warranties or guarantees regarding treatment outcomes. I acknowledge that I will receive a copy of this consent concurrently upon execution via email to the email address I last provided but may also request a printed copy by calling the office of [place].    I understand that my insurance will be billed for this visit.   I have read or had this consent read to me. . I understand the contents of this consent, which adequately explains the benefits and risks of the Services being provided via telemedicine.  . I have been provided ample opportunity to ask questions regarding this consent and the Services and have had my questions answered to my satisfaction. Marland Kitchen  I give my informed consent for the services to be provided through the use of telemedicine in my medical care  By  participating in this telemedicine visit I agree to the above.

## 2018-11-19 NOTE — Progress Notes (Addendum)
Virtual Visit via Video Note   This visit type was conducted due to national recommendations for restrictions regarding the COVID-19 Pandemic (e.g. social distancing) in an effort to limit this patient's exposure and mitigate transmission in our community.  Due to her co-morbid illnesses, this patient is at least at moderate risk for complications without adequate follow up.  This format is felt to be most appropriate for this patient at this time.  All issues noted in this document were discussed and addressed.  A limited physical exam was performed with this format.  Please refer to the patient's chart for her consent to telehealth for [facility].   Evaluation Performed:  Follow-up visit  Date:  11/21/2018   ID:  Crystal [name], DOB Jun 30, 1951, MRN [id number]  Patient Location: Home Provider Location: Home  PCP:  Crist Infante, MD  Cardiologist:  Larae Grooms, MD  Electrophysiologist:  None   Chief Complaint:  Shortness of breath  History of Present Illness:    Crystal [name] is a 68 y.o. female with history of NICM S/P CRT upgrade and revision 05/2018 after EF dropped to 25-30%. LHC nonobstructive CAD.   Last saw Dr. Caryl Comes 3/2/20and breathing improved since CRT upgrade. Was eating excessive salt, started on aldactone. Also has LBBB, history of atrial tachycardia, morbid obestiy, ? Angioedema on ACEI but tolerates ARBs, CKD.  Patient still has shortness of breath when going up stairs. Denies edema since aldactone added. She loves salt and has a hard time cutting back.   The patient does not have symptoms concerning for COVID-19 infection (fever, chills, cough, or new shortness of breath).    Past Medical History:  Diagnosis Date   AICD (automatic cardioverter/defibrillator) present    high RV threshold chronically, device was turned off in 2014; Device battery has been dead x 7 years; "turned it back on 06/22/2018"   Anemia, iron deficiency    Angioedema    felt to  likely be due to ace inhibitors but says she has had this even off of medicines, appears to be tolerating ARBs chronically   Anxiety    Arthritis    "hands, legs, arms; bad in my back" (06/22/2018)   Asthmatic bronchitis with status asthmaticus    Bradycardia    CHF (congestive heart failure) (HCC)    Chronic lower back pain    Chronic pain    Chronic renal insufficiency    CKD (chronic kidney disease), stage III (HCC)    Coronary artery disease    Mild nonobstructive (30% LAD, 30% RCA) by 09/01/16 cath at [facility]   Degenerative joint disease    Depression    Diabetes mellitus, type 2 ([place])    Diabetic peripheral neuropathy ([place])    Dizziness    Dyspnea on exertion    Family history of adverse reaction to anesthesia    Mother has nausea   GERD (gastroesophageal reflux disease)    Gout    Heart valve disorder    History of blood transfusion 1981; ~ 2005; 12/2016   "childbirth; defibrillator OR; colostomy OR"   History of mononucleosis 03/2014   Hyperlipidemia    Hypertension    Hypothyroid    Insomnia    Intervertebral disc degeneration    Ischemic cardiomyopathy    LBBB (left bundle branch block)    Myocardial infarction ([place]) dx'd ~ 2005   Nonischemic cardiomyopathy ([place])    s/p ICD in 2005. EF has since recovered   Obesity    On  home oxygen therapy    "2L at night" (06/22/2018)   OSA on CPAP    Pneumonia    "couple times; last time was 12/2016" (06/22/2018)   PONV (postoperative nausea and vomiting)    Presence of permanent cardiac pacemaker    [employer]   Swelling    Syncope    Systemic hypertension    Vitamin D deficiency    Past Surgical History:  Procedure Laterality Date   APPENDECTOMY  07/13/2017   BLADDER SUSPENSION  1990   "w/hysterectomy"   BOWEL RESECTION N/A 07/09/2018   Procedure: SMALL BOWEL RESECTION;  Surgeon: Judeth Horn, MD;  Location: [place];  Service: General;  Laterality: N/A;    CARDIAC CATHETERIZATION  2005   no obstructive CAD per patient   CARDIAC CATHETERIZATION  2018   CARDIAC DEFIBRILLATOR PLACEMENT  2005   BiV ICD implanted,  LV lead is an epicardial lead   CARPAL TUNNEL RELEASE Left 07/25/2014   Dr.Williamson    CATARACT EXTRACTION W/ INTRAOCULAR LENS  IMPLANT, BILATERAL Bilateral    COLONIC STENT PLACEMENT N/A 08/31/2017   Procedure: COLONIC STENT PLACEMENT;  Surgeon: Carol Ada, MD;  Location: [place];  Service: Endoscopy;  Laterality: N/A;   COLONOSCOPY     2010-2011 Dr.Kipreos    COLOSTOMY  12/2016   Archie Endo 01/26/2017   COLOSTOMY REVERSAL N/A 07/13/2017   Procedure: COLOSTOMY REVERSAL;  Surgeon: Judeth Horn, MD;  Location: [place];  Service: General;  Laterality: N/A;   CYST REMOVAL HAND Right 05/2013   thumb   DILATION AND CURETTAGE OF UTERUS  X 5-6   EXCISION MASS ABDOMINAL N/A 07/09/2018   Procedure: EXPLORATION OF  ABDOMINAL WOUND ERAS PATHWAY;  Surgeon: Judeth Horn, MD;  Location: [place];  Service: General;  Laterality: N/A;   FLEXIBLE SIGMOIDOSCOPY N/A 08/24/2017   Procedure: Beryle Quant;  Surgeon: Carol Ada, MD;  Location: [place];  Service: Endoscopy;  Laterality: N/A;   FLEXIBLE SIGMOIDOSCOPY N/A 08/31/2017   Procedure: FLEXIBLE SIGMOIDOSCOPY;  Surgeon: Carol Ada, MD;  Location: [place];  Service: Endoscopy;  Laterality: N/A;  stent placement   IMPLANTABLE CARDIOVERTER DEFIBRILLATOR GENERATOR CHANGE  2008   INSERTION OF MESH N/A 07/09/2018   Procedure: INSERTION OF VICRYL MESH;  Surgeon: Judeth Horn, MD;  Location: [place];  Service: General;  Laterality: N/A;   LAPAROTOMY N/A 09/18/2017   Procedure: EXPLORATORY LAPAROTOMY;  Surgeon: Judeth Horn, MD;  Location: [place];  Service: General;  Laterality: N/A;   LEAD REVISION  06/22/2018   LEAD REVISION/REPAIR N/A 06/22/2018   Procedure: LEAD REVISION/REPAIR;  Surgeon: Deboraha Sprang, MD;  Location: [place] [facility];  Service: Cardiovascular;   Laterality: N/A;   LYSIS OF ADHESION N/A 09/18/2017   Procedure: LYSIS OF ADHESION;  Surgeon: Judeth Horn, MD;  Location: [place];  Service: General;  Laterality: N/A;   LYSIS OF ADHESION N/A 07/09/2018   Procedure: LYSIS OF ADHESION;  Surgeon: Judeth Horn, MD;  Location: [place];  Service: General;  Laterality: N/A;   PARTIAL COLECTOMY N/A 09/18/2017   Procedure: ILEOCOLECTOMY;  Surgeon: Judeth Horn, MD;  Location: [place];  Service: General;  Laterality: N/A;   PARTIAL COLECTOMY  09/25/2017   RIGHT/LEFT HEART CATH AND CORONARY ANGIOGRAPHY N/A 06/01/2018   Procedure: RIGHT/LEFT HEART CATH AND CORONARY ANGIOGRAPHY;  Surgeon: Jettie Booze, MD;  Location: [place] [facility];  Service: Cardiovascular;  Laterality: N/A;   SIGMOIDOSCOPY N/A 09/18/2017   Procedure: SIGMOIDOSCOPY;  Surgeon: Judeth Horn, MD;  Location: [facility];  Service: General;  Laterality: N/A;   SMALL INTESTINE SURGERY  07/09/2018   EXPLORATION OF ABDOMINAL WOUND ERAS PATHWAYN/; MESH; LYSIS OF ADHESIONS   TOTAL ABDOMINAL HYSTERECTOMY  1990   TRIGGER FINGER RELEASE Right 05/213   TUBAL LIGATION  1980s   VESICOVAGINAL FISTULA CLOSURE W/ TAH       Current Meds  Medication Sig   acetaminophen (TYLENOL) 500 MG tablet Take 1,000 mg by mouth every 6 (six) hours as needed for moderate pain or headache.   albuterol (PROVENTIL HFA;VENTOLIN HFA) 108 (90 Base) MCG/ACT inhaler Inhale 1-2 puffs into the lungs every 6 (six) hours as needed for wheezing or shortness of breath.   allopurinol (ZYLOPRIM) 100 MG tablet TAKE 1 TABLET(100 MG) BY MOUTH DAILY   Calcium Carbonate-Vitamin D (CALTRATE 600+D) 600-400 MG-UNIT tablet Take 1 tablet by mouth 2 (two) times daily.   carvedilol (COREG) 12.5 MG tablet Take 12.5 mg by mouth 2 (two) times daily with a meal.   CRESTOR 10 MG tablet Take 1 tablet (10 mg total) by mouth at bedtime.   diclofenac sodium (VOLTAREN) 1 % GEL Apply 2 g topically 4 (four) times daily as needed (for  pain.).    DULoxetine (CYMBALTA) 30 MG capsule Take 1 capsule (30 mg total) by mouth daily.   Insulin Glargine (TOUJEO MAX SOLOSTAR) 300 UNIT/ML SOPN Inject 130 Units into the skin every evening.    insulin glulisine (APIDRA) 100 UNIT/ML injection Inject 35 Units into the skin 3 (three) times daily before meals.   levothyroxine (SYNTHROID, LEVOTHROID) 88 MCG tablet Take 88 mcg by mouth daily before breakfast.    loratadine (CLARITIN) 10 MG tablet Take 10 mg by mouth daily.   montelukast (SINGULAIR) 10 MG tablet Take 10 mg by mouth at bedtime.   omega-3 acid ethyl esters (LOVAZA) 1 g capsule Take 4 g by mouth at bedtime.    pantoprazole (PROTONIX) 40 MG tablet Take 40 mg by mouth every evening.   spironolactone (ALDACTONE) 25 MG tablet Take 0.5 tablets (12.5 mg total) by mouth daily.   temazepam (RESTORIL) 15 MG capsule Take 1 capsule (15 mg total) by mouth at bedtime as needed for sleep.   torsemide (DEMADEX) 20 MG tablet Take 20 mg by mouth daily.   traMADol (ULTRAM) 50 MG tablet Take 1 tablet (50 mg total) by mouth every 6 (six) hours as needed for moderate pain or severe pain.     Allergies:   Ace inhibitors; Glucophage [metformin hcl]; Telmisartan; Advicor [niacin-lovastatin er]; Bystolic [nebivolol hcl]; Erythromycin; Lipitor [atorvastatin]; Lopid [gemfibrozil]; and Statins   Social History   Tobacco Use   Smoking status: Never Smoker   Smokeless tobacco: Never Used  Substance Use Topics   Alcohol use: Never    Frequency: Never   Drug use: Never     Family Hx: The patient's family history includes Anxiety disorder in her son; Arrhythmia in her mother; Asthma in her mother; Atrial fibrillation in her daughter; Breast cancer in her maternal grandmother; Cancer in her brother and father; Dementia in her brother; Diabetes in her daughter; Diabetes Mellitus II in her mother and another family member; GER disease in her daughter; Heart disease in her father and another  family member; Hypertension in her brother, daughter, daughter, daughter, father, mother, and son; Hypothyroidism in her son.  ROS:   Please see the history of present illness.    Review of Systems  Constitution: Negative.  HENT: Negative.   Eyes: Negative.   Cardiovascular: Positive for  dyspnea on exertion.  Respiratory: Negative.   Hematologic/Lymphatic: Negative.   Musculoskeletal: Positive for arthritis, back pain, joint pain and myalgias.  Gastrointestinal: Negative.   Genitourinary: Negative.   Neurological: Negative.     All other systems reviewed and are negative.   Prior CV studies:   The following studies were reviewed today:  Echo 09/2018 IMPRESSIONS      1. The left ventricle has severely reduced systolic function, with an ejection fraction of 25-30%. The cavity size was moderately dilated. There is mildly increased left ventricular wall thickness. Left ventricular diastolic Doppler parameters are  consistent with impaired relaxation. Left ventricular diffuse hypokinesis.  2. The right ventricle has normal systolic function. The cavity was normal. There is no increase in right ventricular wall thickness.  3. Mild thickening of the mitral valve leaflet. There is mild mitral annular calcification present.  4. The tricuspid valve is grossly normal.  5. The aortic valve is tricuspid Mild thickening of the aortic valve.  6. There is mild dilatation of the aortic root and of the ascending aorta measuring 39 mm.  7. Severe global reduction in LV systolic function; mild diastolic dysfunction; moderate LVE; mild LVH; mild MR.    Cardiac cath 11/2019Prox LAD lesion is 25% stenosed.  Ost 1st Diag lesion is 25% stenosed.  Mid LM lesion is 10% stenosed.  LV end diastolic pressure is mildly elevated. LVEDP 17 mm Hg.  There is no aortic valve stenosis.  Hemodynamic findings consistent with mild pulmonary hypertension.  Ao sat 98%, PA sat 66%, CO 4.79 L/min; CI 2.2, mean  PA 30 mm Hg; mean PCWP 22 mm Hg   No significant CAD.    Medical therapy for LV dysfunction.    Echo 11/2019Study Conclusions   - Left ventricle: The cavity size was moderately dilated. Wall   thickness was increased in a pattern of mild LVH. Left   ventricular geometry showed evidence of eccentric hypertrophy.   Systolic function was severely reduced. The estimated ejection   fraction was in the range of 25% to 30%. Severe diffuse   hypokinesis with no identifiable regional variations. Acoustic   contrast opacification revealed no evidence ofthrombus. - Ventricular septum: Septal motion showed abnormal function,   dyssynergy, and paradox. These changes are consistent with   intraventricular conduction delay. - Mitral valve: Calcified annulus. There was moderate regurgitation   directed centrally. The acceleration rate of the regurgitant jet   was reduced, consistent with a low dP/dt. - Left atrium: The atrium was moderately dilated. - Right ventricle: Systolic function was mildly reduced. - Pericardium, extracardiac: A trivial pericardial effusion was   identified.   Impressions:   - Compared to the 2017 study there is marked worsening of left   ventricular ystolic function, in part related to marked   septal-lateral wall dyssynhrony due to intraventricular   conduction delay.     Labs/Other Tests and Data Reviewed:    EKG:  No ECG reviewed.  Recent Labs: 05/04/2018: TSH 0.53 05/23/2018: NT-Pro BNP 948 09/24/2018: Hemoglobin 11.9; Platelets 208 10/08/2018: BUN 55; Creatinine, Ser 1.86; Potassium 4.7; Sodium 135   Recent Lipid Panel Lab Results  Component Value Date/Time   CHOL 186 11/22/2017 03:07 PM   TRIG 279 (H) 11/22/2017 03:07 PM   HDL 48 (L) 11/22/2017 03:07 PM   CHOLHDL 3.9 11/22/2017 03:07 PM   LDLCALC 98 11/22/2017 03:07 PM    Wt Readings from Last 3 Encounters:  11/20/18 220 lb (99.8 kg)  09/24/18 221 lb  12.8 oz (100.6 kg)  07/15/18 222 lb 8 oz (100.9  kg)     Objective:    Vital Signs:  BP 118/76    Pulse 82    Ht 5\' 6"  (1.676 m)    Wt 220 lb (99.8 kg)    SpO2 95%    BMI 35.51 kg/m    VITAL SIGNS:  reviewed GEN:  no acute distress RESPIRATORY:  normal respiratory effort, symmetric expansion CARDIOVASCULAR:  no peripheral edema  ASSESSMENT & PLAN:    1. NICM EF 25-50% on echo 09/2018-chronic dyspnea on exertion-2 gm sodium diet 2. Nonobstructive CAD on cath 05/2018 3. CRT up grade 05/2018 for lead failure-followed by Dr. Caryl Comes 4. History of Atrial tachycardia-no palpitations 5. HTN well controlled 6. CKD stage 3 Crt 1.86 09/2018 7. HLD-LDL 98, trigylcerides 279 11/2017 8. LBBB 9. DM Hgb A1c 9.8% 09/2018-started on a different insulin but waiting to get the meds at reduced cost.  COVID-19 Education: The signs and symptoms of COVID-19 were discussed with the patient and how to seek care for testing (follow up with PCP or arrange E-visit).  The importance of social distancing was discussed today.  Time:   Today, I have spent  12:40 minutes with the patient with telehealth technology discussing the above problems.     Medication Adjustments/Labs and Tests Ordered: Current medicines are reviewed at length with the patient today.  Concerns regarding medicines are outlined above.   Tests Ordered: No orders of the defined types were placed in this encounter.   Medication Changes: No orders of the defined types were placed in this encounter.   Disposition:  Follow up in 6 month(s) Dr. Irish Lack   Signed, Ermalinda Barrios, PA-C  11/21/2018 12:06 PM    [facility]

## 2018-11-20 ENCOUNTER — Telehealth (INDEPENDENT_AMBULATORY_CARE_PROVIDER_SITE_OTHER): Payer: Medicare Other | Admitting: Physician Assistant

## 2018-11-20 ENCOUNTER — Encounter: Payer: Self-pay | Admitting: Physician Assistant

## 2018-11-20 ENCOUNTER — Other Ambulatory Visit: Payer: Self-pay

## 2018-11-20 VITALS — BP 118/76 | HR 82 | Ht 66.0 in | Wt 220.0 lb

## 2018-11-20 DIAGNOSIS — I447 Left bundle-branch block, unspecified: Secondary | ICD-10-CM

## 2018-11-20 DIAGNOSIS — I10 Essential (primary) hypertension: Secondary | ICD-10-CM

## 2018-11-20 DIAGNOSIS — I428 Other cardiomyopathies: Secondary | ICD-10-CM

## 2018-11-20 DIAGNOSIS — E1165 Type 2 diabetes mellitus with hyperglycemia: Secondary | ICD-10-CM | POA: Diagnosis not present

## 2018-11-20 DIAGNOSIS — E782 Mixed hyperlipidemia: Secondary | ICD-10-CM | POA: Diagnosis not present

## 2018-11-20 DIAGNOSIS — Z794 Long term (current) use of insulin: Secondary | ICD-10-CM | POA: Diagnosis not present

## 2018-11-20 DIAGNOSIS — I2510 Atherosclerotic heart disease of native coronary artery without angina pectoris: Secondary | ICD-10-CM | POA: Diagnosis not present

## 2018-11-20 DIAGNOSIS — N183 Chronic kidney disease, stage 3 unspecified: Secondary | ICD-10-CM

## 2018-11-20 DIAGNOSIS — I471 Supraventricular tachycardia: Secondary | ICD-10-CM

## 2018-11-20 DIAGNOSIS — Z95810 Presence of automatic (implantable) cardiac defibrillator: Secondary | ICD-10-CM | POA: Diagnosis not present

## 2018-11-20 NOTE — Patient Instructions (Signed)
Medication Instructions:  Your physician recommends that you continue on your current medications as directed. Please refer to the Current Medication list given to you today.  If you need a refill on your cardiac medications before your next appointment, please call your pharmacy.   Lab work: None Ordered  If you have labs (blood work) drawn today and your tests are completely normal, you will receive your results only by: Marland Kitchen MyChart Message (if you have MyChart) OR . A paper copy in the mail If you have any lab test that is abnormal or we need to change your treatment, we will call you to review the results.  Testing/Procedures: None ordered  Follow-Up: At [facility], you and your health needs are our priority.  As part of our continuing mission to provide you with exceptional heart care, we have created designated Provider Care Teams.  These Care Teams include your primary Cardiologist (physician) and Advanced Practice Providers (APPs -  Physician Assistants and Nurse Practitioners) who all work together to provide you with the care you need, when you need it. . You will need a follow up appointment in 6 months.  Please call our office 2 months in advance to schedule this appointment.  You may see Casandra Doffing, MD or one of the following Advanced Practice Providers on your designated Care Team:   . Lyda Jester, PA-C . Dayna Dunn, PA-C . Ermalinda Barrios, PA-C  Any Other Special Instructions Will Be Listed Below (If Applicable).  Two Gram Sodium Diet 2000 mg  What is Sodium? Sodium is a mineral found naturally in many foods. The most significant source of sodium in the diet is table salt, which is about 40% sodium.  Processed, convenience, and preserved foods also contain a large amount of sodium.  The body needs only 500 mg of sodium daily to function,  A normal diet provides more than enough sodium even if you do not use salt.  Why Limit Sodium? A build up of sodium in the body  can cause thirst, increased blood pressure, shortness of breath, and water retention.  Decreasing sodium in the diet can reduce edema and risk of heart attack or stroke associated with high blood pressure.  Keep in mind that there are many other factors involved in these health problems.  Heredity, obesity, lack of exercise, cigarette smoking, stress and what you eat all play a role.  General Guidelines:  Do not add salt at the table or in cooking.  One teaspoon of salt contains over 2 grams of sodium.  Read food labels  Avoid processed and convenience foods  Ask your dietitian before eating any foods not dicussed in the menu planning guidelines  Consult your physician if you wish to use a salt substitute or a sodium containing medication such as antacids.  Limit milk and milk products to 16 oz (2 cups) per day.  Shopping Hints:  READ LABELS!! "Dietetic" does not necessarily mean low sodium.  Salt and other sodium ingredients are often added to foods during processing.   Menu Planning Guidelines Food Group Choose More Often Avoid  Beverages (see also the milk group All fruit juices, low-sodium, salt-free vegetables juices, low-sodium carbonated beverages Regular vegetable or tomato juices, commercially softened water used for drinking or cooking  Breads and Cereals Enriched white, wheat, rye and pumpernickel bread, hard rolls and dinner rolls; muffins, cornbread and waffles; most dry cereals, cooked cereal without added salt; unsalted crackers and breadsticks; low sodium or homemade bread crumbs Bread,  rolls and crackers with salted tops; quick breads; instant hot cereals; pancakes; commercial bread stuffing; self-rising flower and biscuit mixes; regular bread crumbs or cracker crumbs  Desserts and Sweets Desserts and sweets mad with mild should be within allowance Instant pudding mixes and cake mixes  Fats Butter or margarine; vegetable oils; unsalted salad dressings, regular salad  dressings limited to 1 Tbs; light, sour and heavy cream Regular salad dressings containing bacon fat, bacon bits, and salt pork; snack dips made with instant soup mixes or processed cheese; salted nuts  Fruits Most fresh, frozen and canned fruits Fruits processed with salt or sodium-containing ingredient (some dried fruits are processed with sodium sulfites        Vegetables Fresh, frozen vegetables and low- sodium canned vegetables Regular canned vegetables, sauerkraut, pickled vegetables, and others prepared in brine; frozen vegetables in sauces; vegetables seasoned with ham, bacon or salt pork  Condiments, Sauces, Miscellaneous  Salt substitute with physician's approval; pepper, herbs, spices; vinegar, lemon or lime juice; hot pepper sauce; garlic powder, onion powder, low sodium soy sauce (1 Tbs.); low sodium condiments (ketchup, chili sauce, mustard) in limited amounts (1 tsp.) fresh ground horseradish; unsalted tortilla chips, pretzels, potato chips, popcorn, salsa (1/4 cup) Any seasoning made with salt including garlic salt, celery salt, onion salt, and seasoned salt; sea salt, rock salt, kosher salt; meat tenderizers; monosodium glutamate; mustard, regular soy sauce, barbecue, sauce, chili sauce, teriyaki sauce, steak sauce, Worcestershire sauce, and most flavored vinegars; canned gravy and mixes; regular condiments; salted snack foods, olives, picles, relish, horseradish sauce, catsup   Food preparation: Try these seasonings Meats:    Pork Sage, onion Serve with applesauce  Chicken Poultry seasoning, thyme, parsley Serve with cranberry sauce  Lamb Curry powder, rosemary, garlic, thyme Serve with mint sauce or jelly  Veal Marjoram, basil Serve with current jelly, cranberry sauce  Beef Pepper, bay leaf Serve with dry mustard, unsalted chive butter  Fish [place] leaf, dill Serve with unsalted lemon butter, unsalted parsley butter  Vegetables:    Asparagus Lemon juice   Broccoli Lemon juice    Carrots Mustard dressing parsley, mint, nutmeg, glazed with unsalted butter and sugar   Green beans Marjoram, lemon juice, nutmeg,dill seed   Tomatoes Basil, marjoram, onion   Spice /blend for [employer]" 4 tsp ground thyme 1 tsp ground sage 3 tsp ground rosemary 4 tsp ground marjoram   Test your knowledge 1. A product that says "Salt Free" may still contain sodium. True or False 2. Garlic Powder and Hot Pepper Sauce an be used as alternative seasonings.True or False 3. Processed foods have more sodium than fresh foods.  True or False 4. Canned Vegetables have less sodium than froze True or False  WAYS TO DECREASE YOUR SODIUM INTAKE 1. Avoid the use of added salt in cooking and at the table.  Table salt (and other prepared seasonings which contain salt) is probably one of the greatest sources of sodium in the diet.  Unsalted foods can gain flavor from the sweet, sour, and butter taste sensations of herbs and spices.  Instead of using salt for seasoning, try the following seasonings with the foods listed.  Remember: how you use them to enhance natural food flavors is limited only by your creativity... Allspice-Meat, fish, eggs, fruit, peas, red and yellow vegetables Almond Extract-Fruit baked goods Anise Seed-Sweet breads, fruit, carrots, beets, cottage cheese, cookies (tastes like licorice) Basil-Meat, fish, eggs, vegetables, rice, vegetables salads, soups, sauces Bay Leaf-Meat, fish, stews, poultry Burnet-Salad, vegetables (  cucumber-like flavor) Caraway Seed-Bread, cookies, cottage cheese, meat, vegetables, cheese, rice Cardamon-Baked goods, fruit, soups Celery Powder or seed-Salads, salad dressings, sauces, meatloaf, soup, bread.Do not use  celery salt Chervil-Meats, salads, fish, eggs, vegetables, cottage cheese (parsley-like flavor) Chili Power-Meatloaf, chicken cheese, corn, eggplant, egg dishes Chives-Salads cottage cheese, egg dishes, soups, vegetables, sauces Cilantro-Salsa,  casseroles Cinnamon-Baked goods, fruit, pork, lamb, chicken, carrots Cloves-Fruit, baked goods, fish, pot roast, green beans, beets, carrots Coriander-Pastry, cookies, meat, salads, cheese (lemon-orange flavor) Cumin-Meatloaf, fish,cheese, eggs, cabbage,fruit pie (caraway flavor) [employer], fruit, eggs, fish, poultry, cottage cheese, vegetables Dill Seed-Meat, cottage cheese, poultry, vegetables, fish, salads, bread Fennel Seed-Bread, cookies, apples, pork, eggs, fish, beets, cabbage, cheese, Licorice-like flavor Garlic-(buds or powder) Salads, meat, poultry, fish, bread, butter, vegetables, potatoes.Do not  use garlic salt Ginger-Fruit, vegetables, baked goods, meat, fish, poultry Horseradish Root-Meet, vegetables, butter Lemon Juice or Extract-Vegetables, fruit, tea, baked goods, fish salads Mace-Baked goods fruit, vegetables, fish, poultry (taste like nutmeg) Maple Extract-Syrups Marjoram-Meat, chicken, fish, vegetables, breads, green salads (taste like Sage) Mint-Tea, lamb, sherbet, vegetables, desserts, carrots, cabbage Mustard, Dry or Seed-Cheese, eggs, meats, vegetables, poultry Nutmeg-Baked goods, fruit, chicken, eggs, vegetables, desserts Onion Powder-Meat, fish, poultry, vegetables, cheese, eggs, bread, rice salads (Do not use   Onion salt) Orange Extract-Desserts, baked goods Oregano-Pasta, eggs, cheese, onions, pork, lamb, fish, chicken, vegetables, green salads Paprika-Meat, fish, poultry, eggs, cheese, vegetables Parsley Flakes-Butter, vegetables, meat fish, poultry, eggs, bread, salads (certain forms may   Contain sodium Pepper-Meat fish, poultry, vegetables, eggs Peppermint Extract-Desserts, baked goods Poppy Seed-Eggs, bread, cheese, fruit dressings, baked goods, noodles, vegetables, cottage  [employer], poultry, meat, fish, cauliflower, turnips,eggs bread Saffron-Rice, bread, veal, chicken, fish, eggs Sage-Meat, fish,  poultry, onions, eggplant, tomateos, pork, stews Savory-Eggs, salads, poultry, meat, rice, vegetables, soups, pork Tarragon-Meat, poultry, fish, eggs, butter, vegetables (licorice-like flavor)  Thyme-Meat, poultry, fish, eggs, vegetables, (clover-like flavor), sauces, soups Tumeric-Salads, butter, eggs, fish, rice, vegetables (saffron-like flavor) Vanilla Extract-Baked goods, candy Vinegar-Salads, vegetables, meat marinades Walnut Extract-baked goods, candy  2. Choose your Foods Wisely   The following is a list of foods to avoid which are high in sodium:  Meats-Avoid all smoked, canned, salt cured, dried and kosher meat and fish as well as Anchovies   [employer] meats:Bologna, Liverwurst, Pastrami Canned meat or fish  Marinated herring Caviar    Pepperoni Corned Beef   Pizza Dried chipped beef  Salami Frozen breaded fish or meat Salt pork Frankfurters or hot dogs  Sardines Gefilte fish   Sausage Ham (boiled ham, Proscuitto Smoked butt    spiced ham)   Spam      TV Dinners Vegetables Canned vegetables (Regular) Relish Canned mushrooms  Sauerkraut Olives    Tomato juice Pickles  Bakery and Dessert Products Canned puddings  Cream pies Cheesecake   Decorated cakes Cookies  Beverages/Juices Tomato juice, regular  Gatorade   V-8 vegetable juice, regular  Breads and Cereals Biscuit mixes   Salted potato chips, corn chips, pretzels Bread stuffing mixes  Salted crackers and rolls Pancake and waffle mixes Self-rising flour  Seasonings Accent    Meat sauces Barbecue sauce  Meat tenderizer Catsup    Monosodium glutamate (MSG) Celery salt   Onion salt Chili sauce   Prepared mustard Garlic salt   Salt, seasoned salt, sea salt Gravy mixes   Soy sauce Horseradish   Steak sauce Ketchup   Tartar sauce Lite salt    Teriyaki sauce Marinade mixes   Worcestershire sauce  Others Baking powder   Cocoa and cocoa mixes Baking soda   Commercial casserole  mixes Candy-caramels, chocolate  Dehydrated soups    Bars, fudge,nougats  Instant rice and pasta mixes Canned broth or soup  Maraschino cherries Cheese, aged and processed cheese and cheese spreads  Learning Assessment Quiz  Indicated T (for True) or F (for False) for each of the following statements:  1. _____ Fresh fruits and vegetables and unprocessed grains are generally low in sodium 2. _____ Water may contain a considerable amount of sodium, depending on the source 3. _____ You can always tell if a food is high in sodium by tasting it 4. _____ Certain laxatives my be high in sodium and should be avoided unless prescribed   by a physician or pharmacist 5. _____ Salt substitutes may be used freely by anyone on a sodium restricted diet 6. _____ Sodium is present in table salt, food additives and as a natural component of   most foods 7. _____ Table salt is approximately 90% sodium 8. _____ Limiting sodium intake may help prevent excess fluid accumulation in the body 9. _____ On a sodium-restricted diet, seasonings such as bouillon soy sauce, and    cooking wine should be used in place of table salt 10. _____ On an ingredient list, a product which lists monosodium glutamate as the first   ingredient is an appropriate food to include on a low sodium diet  Circle the best answer(s) to the following statements (Hint: there may be more than one correct answer)  11. On a low-sodium diet, some acceptable snack items are:    A. Olives  F. Bean dip   K. Grapefruit juice    B. Salted Pretzels G. Commercial Popcorn   L. Canned peaches    C. Carrot Sticks  H. Bouillon   M. Unsalted nuts   D. [place] fries  I. Peanut butter crackers N. Salami   E. Sweet pickles J. Tomato Juice   O. Pizza  12.  Seasonings that may be used freely on a reduced - sodium diet include   A. Lemon wedges F.Monosodium glutamate K. Celery seed    B.Soysauce   G. Pepper   L. Mustard powder   C. Sea salt  H.  Cooking wine  M. Onion flakes   D. Vinegar  E. Prepared horseradish N. Salsa   E. Sage   J. Worcestershire sauce  O. Chutney

## 2018-11-21 ENCOUNTER — Ambulatory Visit: Payer: Medicare Other | Admitting: Interventional Cardiology

## 2018-12-11 DIAGNOSIS — N183 Chronic kidney disease, stage 3 (moderate): Secondary | ICD-10-CM | POA: Diagnosis not present

## 2018-12-11 DIAGNOSIS — I10 Essential (primary) hypertension: Secondary | ICD-10-CM | POA: Diagnosis not present

## 2018-12-11 DIAGNOSIS — Z794 Long term (current) use of insulin: Secondary | ICD-10-CM | POA: Diagnosis not present

## 2018-12-11 DIAGNOSIS — E1129 Type 2 diabetes mellitus with other diabetic kidney complication: Secondary | ICD-10-CM | POA: Diagnosis not present

## 2018-12-24 ENCOUNTER — Ambulatory Visit (INDEPENDENT_AMBULATORY_CARE_PROVIDER_SITE_OTHER): Payer: Medicare Other | Admitting: *Deleted

## 2018-12-24 DIAGNOSIS — I428 Other cardiomyopathies: Secondary | ICD-10-CM | POA: Diagnosis not present

## 2018-12-26 LAB — CUP PACEART REMOTE DEVICE CHECK
Date Time Interrogation Session: 20200603082457
Implantable Lead Implant Date: 20191129
Implantable Lead Implant Date: 20191129
Implantable Lead Implant Date: 20191129
Implantable Lead Location: 753858
Implantable Lead Location: 753859
Implantable Lead Location: 753860
Implantable Lead Model: 293
Implantable Lead Model: 4086
Implantable Lead Serial Number: 226691
Implantable Lead Serial Number: 440868
Implantable Pulse Generator Implant Date: 20191129
Pulse Gen Serial Number: 227627

## 2019-01-01 ENCOUNTER — Encounter: Payer: Self-pay | Admitting: Cardiology

## 2019-01-01 NOTE — Progress Notes (Signed)
Remote ICD transmission.   

## 2019-01-08 DIAGNOSIS — E1129 Type 2 diabetes mellitus with other diabetic kidney complication: Secondary | ICD-10-CM | POA: Diagnosis not present

## 2019-01-08 DIAGNOSIS — I509 Heart failure, unspecified: Secondary | ICD-10-CM | POA: Diagnosis not present

## 2019-01-08 DIAGNOSIS — N183 Chronic kidney disease, stage 3 (moderate): Secondary | ICD-10-CM | POA: Diagnosis not present

## 2019-01-08 DIAGNOSIS — Z95810 Presence of automatic (implantable) cardiac defibrillator: Secondary | ICD-10-CM | POA: Diagnosis not present

## 2019-01-08 DIAGNOSIS — M5489 Other dorsalgia: Secondary | ICD-10-CM | POA: Diagnosis not present

## 2019-01-08 DIAGNOSIS — G609 Hereditary and idiopathic neuropathy, unspecified: Secondary | ICD-10-CM | POA: Diagnosis not present

## 2019-01-08 DIAGNOSIS — K219 Gastro-esophageal reflux disease without esophagitis: Secondary | ICD-10-CM | POA: Diagnosis not present

## 2019-01-08 DIAGNOSIS — Z794 Long term (current) use of insulin: Secondary | ICD-10-CM | POA: Diagnosis not present

## 2019-01-08 DIAGNOSIS — G4733 Obstructive sleep apnea (adult) (pediatric): Secondary | ICD-10-CM | POA: Diagnosis not present

## 2019-01-08 DIAGNOSIS — M109 Gout, unspecified: Secondary | ICD-10-CM | POA: Diagnosis not present

## 2019-01-08 DIAGNOSIS — I130 Hypertensive heart and chronic kidney disease with heart failure and stage 1 through stage 4 chronic kidney disease, or unspecified chronic kidney disease: Secondary | ICD-10-CM | POA: Diagnosis not present

## 2019-01-08 DIAGNOSIS — E785 Hyperlipidemia, unspecified: Secondary | ICD-10-CM | POA: Diagnosis not present

## 2019-01-10 DIAGNOSIS — M109 Gout, unspecified: Secondary | ICD-10-CM | POA: Diagnosis not present

## 2019-01-10 DIAGNOSIS — I10 Essential (primary) hypertension: Secondary | ICD-10-CM | POA: Diagnosis not present

## 2019-01-10 DIAGNOSIS — E785 Hyperlipidemia, unspecified: Secondary | ICD-10-CM | POA: Diagnosis not present

## 2019-01-10 DIAGNOSIS — E1129 Type 2 diabetes mellitus with other diabetic kidney complication: Secondary | ICD-10-CM | POA: Diagnosis not present

## 2019-01-13 ENCOUNTER — Other Ambulatory Visit: Payer: Self-pay | Admitting: Family

## 2019-01-13 DIAGNOSIS — F329 Major depressive disorder, single episode, unspecified: Secondary | ICD-10-CM

## 2019-01-13 DIAGNOSIS — F32A Depression, unspecified: Secondary | ICD-10-CM

## 2019-01-14 DIAGNOSIS — I130 Hypertensive heart and chronic kidney disease with heart failure and stage 1 through stage 4 chronic kidney disease, or unspecified chronic kidney disease: Secondary | ICD-10-CM | POA: Diagnosis not present

## 2019-01-14 DIAGNOSIS — I509 Heart failure, unspecified: Secondary | ICD-10-CM | POA: Diagnosis not present

## 2019-01-14 DIAGNOSIS — N184 Chronic kidney disease, stage 4 (severe): Secondary | ICD-10-CM | POA: Diagnosis not present

## 2019-01-14 DIAGNOSIS — E1159 Type 2 diabetes mellitus with other circulatory complications: Secondary | ICD-10-CM | POA: Diagnosis not present

## 2019-01-14 DIAGNOSIS — Z794 Long term (current) use of insulin: Secondary | ICD-10-CM | POA: Diagnosis not present

## 2019-01-28 DIAGNOSIS — I130 Hypertensive heart and chronic kidney disease with heart failure and stage 1 through stage 4 chronic kidney disease, or unspecified chronic kidney disease: Secondary | ICD-10-CM | POA: Diagnosis not present

## 2019-02-06 DIAGNOSIS — N184 Chronic kidney disease, stage 4 (severe): Secondary | ICD-10-CM | POA: Diagnosis not present

## 2019-02-06 DIAGNOSIS — Z794 Long term (current) use of insulin: Secondary | ICD-10-CM | POA: Diagnosis not present

## 2019-02-06 DIAGNOSIS — I130 Hypertensive heart and chronic kidney disease with heart failure and stage 1 through stage 4 chronic kidney disease, or unspecified chronic kidney disease: Secondary | ICD-10-CM | POA: Diagnosis not present

## 2019-02-06 DIAGNOSIS — E1159 Type 2 diabetes mellitus with other circulatory complications: Secondary | ICD-10-CM | POA: Diagnosis not present

## 2019-02-10 ENCOUNTER — Other Ambulatory Visit: Payer: Self-pay | Admitting: Internal Medicine

## 2019-02-28 DIAGNOSIS — N183 Chronic kidney disease, stage 3 (moderate): Secondary | ICD-10-CM | POA: Diagnosis not present

## 2019-03-04 IMAGING — DX DG CHEST 1V PORT
1 series · 1 of 1 positions shown · non-contrast
Comparison: Chest x-ray of 06/23/2017

CLINICAL DATA: Decreased breath sounds at the left lung base on
physical exam, history of CHF, diabetes, and asthma

EXAM:
PORTABLE CHEST 1 VIEW

[chest ap]
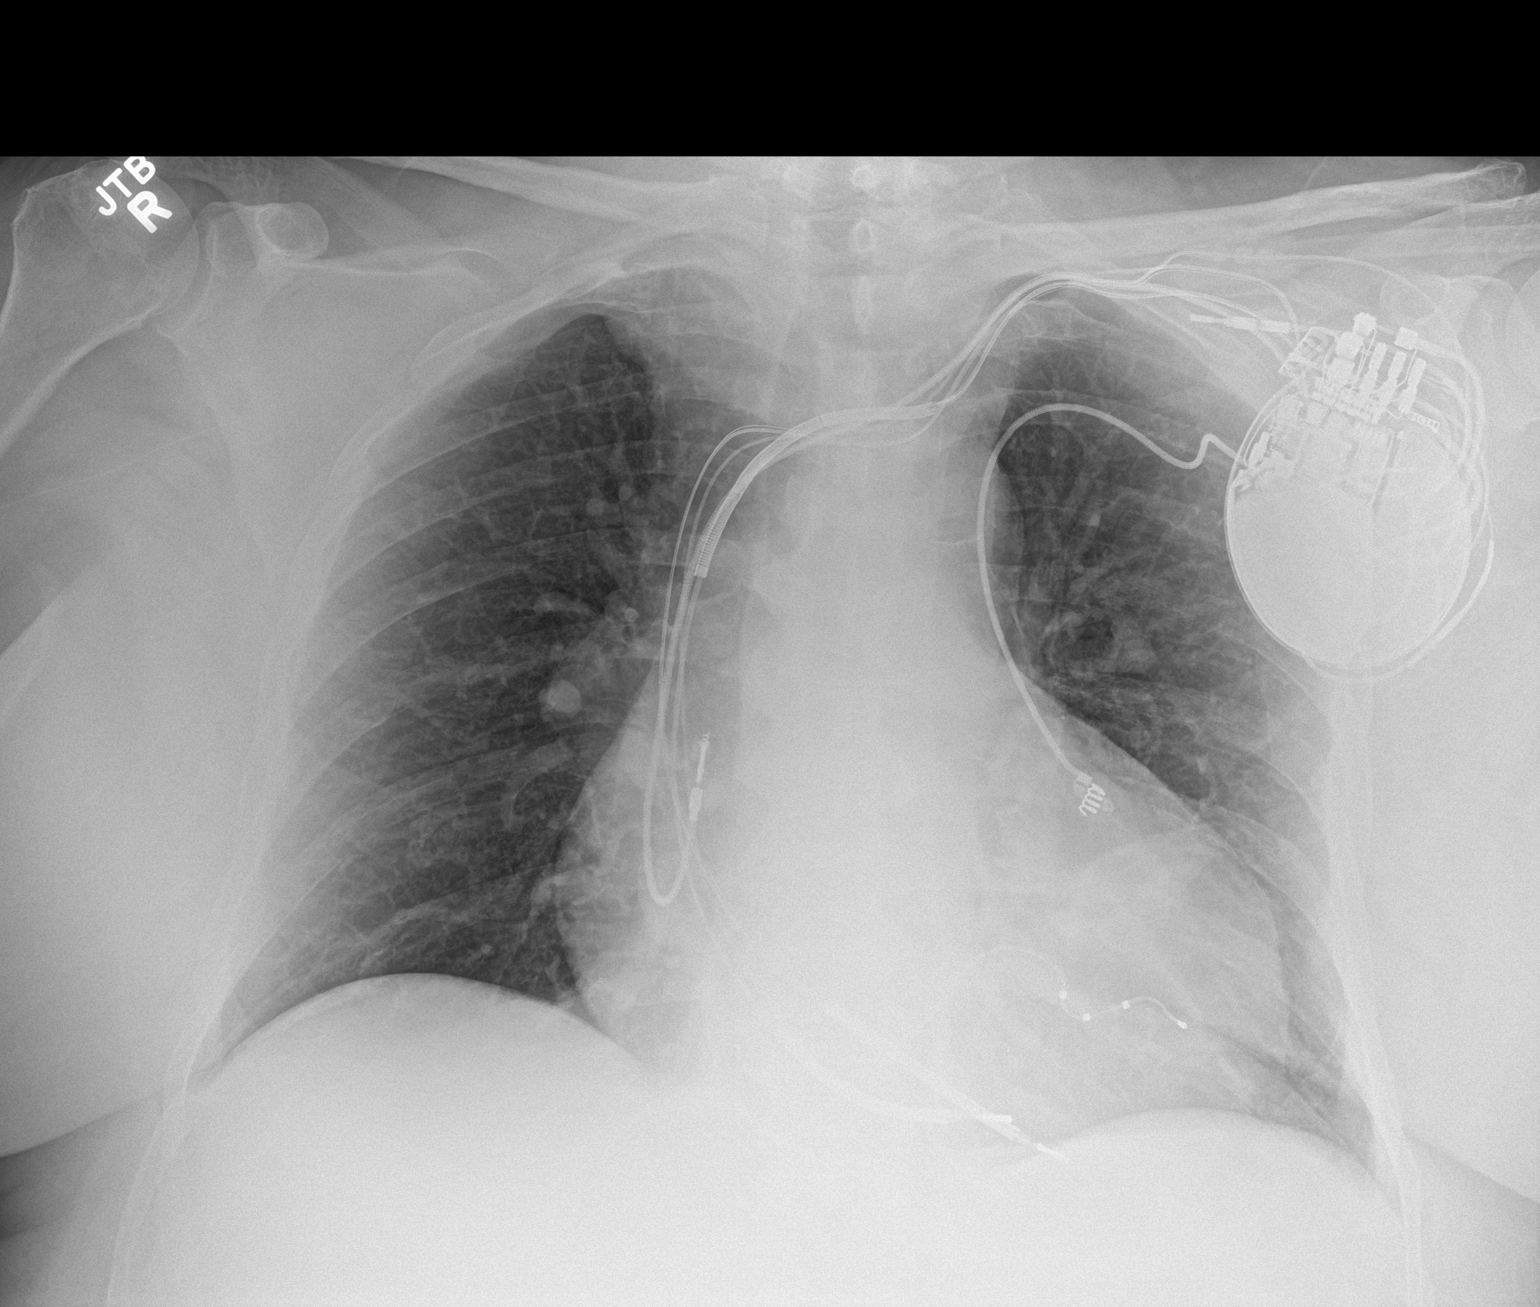

[1 of 1 positions shown; findings below may reference images not displayed]

FINDINGS: No active infiltrate or effusion is seen. There is some opacity
overlying the heart shadow at the left lung base which could
represent atelectasis or possibly patchy pneumonia. Follow-up
two-view chest x-ray is recommended. Cardiomegaly is stable and
pacer and AICD leads remain.
IMPRESSION: 1. Linear opacity at the left lung base may represent atelectasis or
pneumonia. Recommend follow-up two-view chest x-ray.
2. Stable cardiomegaly with pacer and AICD leads.

## 2019-03-08 DIAGNOSIS — N183 Chronic kidney disease, stage 3 (moderate): Secondary | ICD-10-CM | POA: Diagnosis not present

## 2019-03-08 DIAGNOSIS — N189 Chronic kidney disease, unspecified: Secondary | ICD-10-CM | POA: Diagnosis not present

## 2019-03-11 DIAGNOSIS — E559 Vitamin D deficiency, unspecified: Secondary | ICD-10-CM | POA: Diagnosis not present

## 2019-03-11 DIAGNOSIS — I129 Hypertensive chronic kidney disease with stage 1 through stage 4 chronic kidney disease, or unspecified chronic kidney disease: Secondary | ICD-10-CM | POA: Diagnosis not present

## 2019-03-11 DIAGNOSIS — E1122 Type 2 diabetes mellitus with diabetic chronic kidney disease: Secondary | ICD-10-CM | POA: Diagnosis not present

## 2019-03-11 DIAGNOSIS — N183 Chronic kidney disease, stage 3 (moderate): Secondary | ICD-10-CM | POA: Diagnosis not present

## 2019-03-11 DIAGNOSIS — I5042 Chronic combined systolic (congestive) and diastolic (congestive) heart failure: Secondary | ICD-10-CM | POA: Diagnosis not present

## 2019-03-21 DIAGNOSIS — N183 Chronic kidney disease, stage 3 (moderate): Secondary | ICD-10-CM | POA: Diagnosis not present

## 2019-03-21 DIAGNOSIS — I130 Hypertensive heart and chronic kidney disease with heart failure and stage 1 through stage 4 chronic kidney disease, or unspecified chronic kidney disease: Secondary | ICD-10-CM | POA: Diagnosis not present

## 2019-03-21 DIAGNOSIS — Z23 Encounter for immunization: Secondary | ICD-10-CM | POA: Diagnosis not present

## 2019-03-21 DIAGNOSIS — G609 Hereditary and idiopathic neuropathy, unspecified: Secondary | ICD-10-CM | POA: Diagnosis not present

## 2019-03-21 DIAGNOSIS — Z794 Long term (current) use of insulin: Secondary | ICD-10-CM | POA: Diagnosis not present

## 2019-03-21 DIAGNOSIS — E1159 Type 2 diabetes mellitus with other circulatory complications: Secondary | ICD-10-CM | POA: Diagnosis not present

## 2019-03-25 ENCOUNTER — Ambulatory Visit: Payer: Medicare Other | Admitting: *Deleted

## 2019-03-26 LAB — CUP PACEART REMOTE DEVICE CHECK
Date Time Interrogation Session: 20200901165136
Implantable Lead Implant Date: 20191129
Implantable Lead Implant Date: 20191129
Implantable Lead Implant Date: 20191129
Implantable Lead Location: 753858
Implantable Lead Location: 753859
Implantable Lead Location: 753860
Implantable Lead Model: 293
Implantable Lead Model: 4086
Implantable Lead Serial Number: 226691
Implantable Lead Serial Number: 440868
Implantable Pulse Generator Implant Date: 20191129
Pulse Gen Serial Number: 227627

## 2019-03-28 DIAGNOSIS — G5601 Carpal tunnel syndrome, right upper limb: Secondary | ICD-10-CM | POA: Diagnosis not present

## 2019-03-29 ENCOUNTER — Ambulatory Visit (INDEPENDENT_AMBULATORY_CARE_PROVIDER_SITE_OTHER): Payer: Medicare Other | Admitting: *Deleted

## 2019-03-29 ENCOUNTER — Telehealth: Payer: Self-pay | Admitting: *Deleted

## 2019-03-29 DIAGNOSIS — I428 Other cardiomyopathies: Secondary | ICD-10-CM

## 2019-03-29 NOTE — Telephone Encounter (Signed)
   [facility] Pre-operative Risk Assessment    Request for surgical clearance:  1. What type of surgery is being performed? RIGHT ENDOSCOPIC CARPAL TUNNEL RELEASE + RIGHT INDEX TRIGGER RELEASE   2. When is this surgery scheduled? TBD   3. What type of clearance is required (medical clearance vs. Pharmacy clearance to hold med vs. Both)? MEDICAL  4. Are there any medications that need to be held prior to surgery and how long? NONE LISTED   5. Practice name and name of physician performing surgery? [facility]; DR. DAVID THOMPSON  6. What is your office phone number [phone]    7.   What is your office fax number [phone]  8.   Anesthesia type (None, local, MAC, general) ? NOT LISTED. GENERAL?   Julaine Hua 03/29/2019, 11:42 AM  _________________________________________________________________   (provider comments below)

## 2019-04-03 ENCOUNTER — Other Ambulatory Visit: Payer: Self-pay | Admitting: Physician Assistant

## 2019-04-03 NOTE — Telephone Encounter (Signed)
   Primary Cardiologist: Larae Grooms, MD  Chart reviewed as part of pre-operative protocol coverage. Patient was contacted 04/03/2019 in reference to pre-operative risk assessment for pending surgery as outlined below.  Crystal [name] was last seen on 11/20/2018 via virtual visit by Ermalinda Barrios PA-C.  Since that day, Crystal [name] has done well without chest pain or shortness of breath. She is a moderate risk patient going through a low risk surgery.  Therefore, based on ACC/AHA guidelines, the patient would be at acceptable risk for the planned procedure without further cardiovascular testing.   I will route this recommendation to the requesting party via Epic fax function and remove from pre-op pool.  Please call with questions.  [place], [place] 04/03/2019, 11:33 AM

## 2019-04-04 ENCOUNTER — Other Ambulatory Visit: Payer: Self-pay | Admitting: Orthopedic Surgery

## 2019-04-04 NOTE — H&P (Addendum)
Crystal [name] is an 68 y.o. female.   CC / Reason for Visit: Bilateral hand problems; left knee pain HPI: Patient returns to clinic today for reevaluation indicating that her right hand is significantly painful catching of her index finger and a burning sensation into her median nerve distribution.  She had nerve conduction studies done long ago in 2016 that indicated at that time she had severe carpal tunnel bilaterally, but never proceed with right-sided release.  She has recently had a decrease in her gabapentin secondary to her kidney function as she is a insulin-dependent diabetic.  Furthermore, her implanted defibrillator was reactivated recently.    HPI 05/18/2019: Patient returns to clinic today for a knee injection into her left knee.  She reports that the Voltaren gel has been helpful, but it does not alleviate her problem completely.    Past Medical History:  Diagnosis Date  . AICD (automatic cardioverter/defibrillator) present    high RV threshold chronically, device was turned off in 2014; Device battery has been dead x 7 years; "turned it back on 06/22/2018"  . Anemia, iron deficiency   . Angioedema    felt to likely be due to ace inhibitors but says she has had this even off of medicines, appears to be tolerating ARBs chronically  . Anxiety   . Arthritis    "hands, legs, arms; bad in my back" (06/22/2018)  . Asthmatic bronchitis with status asthmaticus   . Bradycardia   . CHF (congestive heart failure) ([place])   . Chronic lower back pain   . Chronic pain   . Chronic renal insufficiency   . CKD (chronic kidney disease), stage III ([place])   . Coronary artery disease    Mild nonobstructive (30% LAD, 30% RCA) by 09/01/16 cath at [facility]  . Degenerative joint disease   . Depression   . Diabetes mellitus, type 2 ([place])   . Diabetic peripheral neuropathy ([place])   . Dizziness   . Dyspnea on exertion   . Family history of adverse reaction to anesthesia    Mother has nausea   . GERD (gastroesophageal reflux disease)   . Gout   . Heart valve disorder   . History of blood transfusion 1981; ~ 2005; 12/2016   "childbirth; defibrillator OR; colostomy OR"  . History of mononucleosis 03/2014  . Hyperlipidemia   . Hypertension   . Hypothyroid   . Insomnia   . Intervertebral disc degeneration   . Ischemic cardiomyopathy   . LBBB (left bundle branch block)   . Myocardial infarction ([place]) dx'd ~ 2005  . Nonischemic cardiomyopathy ([place])    s/p ICD in 2005. EF has since recovered  . Obesity   . On home oxygen therapy    "2L at night" (06/22/2018)  . OSA on CPAP   . Pneumonia    "couple times; last time was 12/2016" (06/22/2018)  . PONV (postoperative nausea and vomiting)   . Presence of permanent cardiac pacemaker    [employer]  . Swelling   . Syncope   . Systemic hypertension   . Vitamin D deficiency     Past Surgical History:  Procedure Laterality Date  . APPENDECTOMY  07/13/2017  . BLADDER SUSPENSION  1990   "w/hysterectomy"  . BOWEL RESECTION N/A 07/09/2018   Procedure: SMALL BOWEL RESECTION;  Surgeon: Judeth Horn, MD;  Location: [place];  Service: General;  Laterality: N/A;  . CARDIAC CATHETERIZATION  2005   no obstructive CAD per patient  . CARDIAC CATHETERIZATION  2018  . CARDIAC DEFIBRILLATOR PLACEMENT  2005   BiV ICD implanted,  LV lead is an epicardial lead  . CARPAL TUNNEL RELEASE Left 07/25/2014   Dr.Williamson   . CATARACT EXTRACTION W/ INTRAOCULAR LENS  IMPLANT, BILATERAL Bilateral   . COLONIC STENT PLACEMENT N/A 08/31/2017   Procedure: COLONIC STENT PLACEMENT;  Surgeon: Carol Ada, MD;  Location: [place];  Service: Endoscopy;  Laterality: N/A;  . COLONOSCOPY     2010-2011 Dr.Kipreos   . COLOSTOMY  12/2016   Archie Endo 01/26/2017  . COLOSTOMY REVERSAL N/A 07/13/2017   Procedure: COLOSTOMY REVERSAL;  Surgeon: Judeth Horn, MD;  Location: [place];  Service: General;  Laterality: N/A;  . CYST REMOVAL HAND Right 05/2013   thumb  .  DILATION AND CURETTAGE OF UTERUS  X 5-6  . EXCISION MASS ABDOMINAL N/A 07/09/2018   Procedure: EXPLORATION OF  ABDOMINAL WOUND ERAS PATHWAY;  Surgeon: Judeth Horn, MD;  Location: [place];  Service: General;  Laterality: N/A;  . FLEXIBLE SIGMOIDOSCOPY N/A 08/24/2017   Procedure: Beryle Quant;  Surgeon: Carol Ada, MD;  Location: [place];  Service: Endoscopy;  Laterality: N/A;  . FLEXIBLE SIGMOIDOSCOPY N/A 08/31/2017   Procedure: FLEXIBLE SIGMOIDOSCOPY;  Surgeon: Carol Ada, MD;  Location: [place];  Service: Endoscopy;  Laterality: N/A;  stent placement  . IMPLANTABLE CARDIOVERTER DEFIBRILLATOR GENERATOR CHANGE  2008  . INSERTION OF MESH N/A 07/09/2018   Procedure: INSERTION OF VICRYL MESH;  Surgeon: Judeth Horn, MD;  Location: [place];  Service: General;  Laterality: N/A;  . LAPAROTOMY N/A 09/18/2017   Procedure: EXPLORATORY LAPAROTOMY;  Surgeon: Judeth Horn, MD;  Location: [place];  Service: General;  Laterality: N/A;  . LEAD REVISION  06/22/2018  . LEAD REVISION/REPAIR N/A 06/22/2018   Procedure: LEAD REVISION/REPAIR;  Surgeon: Deboraha Sprang, MD;  Location: [place] [facility];  Service: Cardiovascular;  Laterality: N/A;  . LYSIS OF ADHESION N/A 09/18/2017   Procedure: LYSIS OF ADHESION;  Surgeon: Judeth Horn, MD;  Location: [place];  Service: General;  Laterality: N/A;  . LYSIS OF ADHESION N/A 07/09/2018   Procedure: LYSIS OF ADHESION;  Surgeon: Judeth Horn, MD;  Location: [place];  Service: General;  Laterality: N/A;  . PARTIAL COLECTOMY N/A 09/18/2017   Procedure: ILEOCOLECTOMY;  Surgeon: Judeth Horn, MD;  Location: [place];  Service: General;  Laterality: N/A;  . PARTIAL COLECTOMY  09/25/2017  . RIGHT/LEFT HEART CATH AND CORONARY ANGIOGRAPHY N/A 06/01/2018   Procedure: RIGHT/LEFT HEART CATH AND CORONARY ANGIOGRAPHY;  Surgeon: Jettie Booze, MD;  Location: [place] [facility];  Service: Cardiovascular;  Laterality: N/A;  . SIGMOIDOSCOPY N/A 09/18/2017   Procedure:  SIGMOIDOSCOPY;  Surgeon: Judeth Horn, MD;  Location: [place];  Service: General;  Laterality: N/A;  . SMALL INTESTINE SURGERY  07/09/2018   EXPLORATION OF ABDOMINAL WOUND ERAS PATHWAYN/; MESH; LYSIS OF ADHESIONS  . TOTAL ABDOMINAL HYSTERECTOMY  1990  . TRIGGER FINGER RELEASE Right 05/213  . TUBAL LIGATION  1980s  . VESICOVAGINAL FISTULA CLOSURE W/ TAH      Family History  Problem Relation Age of Onset  . Hypertension Father   . Heart disease Father   . Cancer Father        prostate  . Heart disease Other        (Maternal side) Ischemic heart disease  . Diabetes Mellitus II Other   . Arrhythmia Mother   . Diabetes Mellitus II Mother        Borderline DM  . Hypertension  Mother   . Asthma Mother   . Cancer Brother   . Dementia Brother   . Hypertension Brother   . Hypertension Daughter   . Hypertension Daughter   . Diabetes Daughter   . Hypertension Daughter   . Atrial fibrillation Daughter   . GER disease Daughter   . Hypertension Son   . Anxiety disorder Son   . Hypothyroidism Son   . Breast cancer Maternal Grandmother    Social History:  reports that she has never smoked. She has never used smokeless tobacco. She reports that she does not drink alcohol or use drugs.  Allergies:  Allergies  Allergen Reactions  . Ace Inhibitors Swelling and Other (See Comments)    Angioedema  . Glucophage [Metformin Hcl] Other (See Comments)    Renal failure  . Telmisartan Other (See Comments)    AVOID ARB/ ACEi in this patient due to recurrent AKI  . Advicor [Niacin-Lovastatin Er] Other (See Comments)    Muscle aches  . Bystolic [Nebivolol Hcl] Swelling and Other (See Comments)    UNSPECIFIED SWELLING/EDEMA  . Erythromycin Diarrhea, Nausea And Vomiting and Other (See Comments)    *DERIVATIVES*  . Lipitor [Atorvastatin] Other (See Comments)    Muscle aches  . Lopid [Gemfibrozil] Other (See Comments)    Muscle aches  . Statins Other (See Comments)    Muscle aches    No  medications prior to admission.    No results found for this or any previous visit (from the past 48 hour(s)). No results found.  Review of Systems  All other systems reviewed and are negative.   There were no vitals taken for this visit. Physical Exam  Constitutional:  WD, WN, NAD HEENT:  NCAT, EOMI Neuro/Psych:  Alert & oriented to person, place, and time; appropriate mood & affect Lymphatic: No generalized UE edema or lymphadenopathy Extremities / MSK:  Both UE are normal with respect to appearance, ranges of motion, joint stability, muscle strength/tone, sensation, & perfusion except as otherwise noted: The right hand is normal in appearance without any thenar wasting.  There is full digital motion.  Monofilament testing thumb through small finger and ring finger ulnar aspect 3.61.  Ring finger radial aspect 4.31.  Grip strength in position 2: Right 20, left 40.  Light touch sensibility altered in median nerve distribution with appropriate ring finger splitting.  Tinel sign over the carpal tunnel.  The right index finger is painful specifically over the A1 pulley with no active locking and catching, but incarcerated in extension.  Labs / X-rays:  No radiographic studies obtained today.  Assessment: 1.  Right index finger trigger, injected 03-07-18 2.  Right carpal tunnel syndrome 3.  Bilateral TMC osteoarthritis 4.  Left long finger trigger finger-injected 04/05/2018, 05/03/2018 5.  Left small finger trigger finger-injected 04/05/2018, 05/03/2018 6.  Left knee pain- injected 05/17/2018   Plan: The findings are discussed with the patient.  She wishes to move forward with right endoscopic carpal tunnel release and right index finger trigger digit release.The details of the operative procedure were discussed with the patient.  Questions were invited and answered.  In addition to the goal of the procedure, the risks of the procedure to include but not limited to bleeding; infection;  damage to the nerves or blood vessels that could result in bleeding, numbness, weakness, chronic pain, and the need for additional procedures; stiffness; the need for revision surgery; and anesthetic risks were reviewed.  No specific outcome was guaranteed or implied.  Informed consent was obtained.   Jolyn Nap, MD 04/04/2019, 11:06 AM

## 2019-04-05 ENCOUNTER — Encounter (HOSPITAL_COMMUNITY): Payer: Self-pay | Admitting: *Deleted

## 2019-04-05 ENCOUNTER — Other Ambulatory Visit: Payer: Self-pay

## 2019-04-05 ENCOUNTER — Other Ambulatory Visit (HOSPITAL_COMMUNITY)
Admission: RE | Admit: 2019-04-05 | Discharge: 2019-04-05 | Disposition: A | Discharge status: Home / Self Care | Payer: Medicare Other | Source: Ambulatory Visit | Attending: Orthopedic Surgery | Admitting: Orthopedic Surgery

## 2019-04-05 DIAGNOSIS — Z20828 Contact with and (suspected) exposure to other viral communicable diseases: Secondary | ICD-10-CM | POA: Diagnosis not present

## 2019-04-05 DIAGNOSIS — Z01812 Encounter for preprocedural laboratory examination: Secondary | ICD-10-CM | POA: Diagnosis not present

## 2019-04-05 DIAGNOSIS — G5601 Carpal tunnel syndrome, right upper limb: Secondary | ICD-10-CM | POA: Diagnosis not present

## 2019-04-05 LAB — SARS CORONAVIRUS 2 (TAT 6-24 HRS): SARS Coronavirus 2: NEGATIVE

## 2019-04-05 NOTE — Progress Notes (Signed)
Crystal [name] denies chest pain or shortness of breath. Patient was tested for Covid and been in quarantine with her daughters.  Crystal [name] has Sleep apnea and uses a CPAP with oxygen at hs.  Patient has a Company secretary. Crystal [name], I faxed peri op device order request to the [employer]. Crystal Ferraiolo has type II diabetes and uses U 500 Insulin, Dr Joylene Draft. PCP manages diabetes .  I instructed patient to call Dr. Joylene Draft and ask for Instructions regarding Insulin for the am of surgery.  Patient reporst that am BCG's run 90- 170. I instructed patient to check CBG after awaking and every 2 hours until arrival  to the hospital.  I Instructed patient if CBG is less than 70 to take 4 Glucose Tablets. Recheck CBG in 15 minutes then call pre- op desk at [phone] for further instructions. If scheduled to receive Insulin, do not take Insulin.  I instructed patient to not eat after midnight Sunday. I instructed patient that she can drink clear liquids until 0530 Monday am.  I reviewed what clear liquids consist of.  Patient will have her daughter pick up G2 today at the main entrance. I instructed patient to drink the G2 between 0500 and 0530.  I also instructed patient to inform Dr Joylene Draft what she will be drinking before 0530.

## 2019-04-08 ENCOUNTER — Ambulatory Visit (HOSPITAL_COMMUNITY)
Admission: RE | Admit: 2019-04-08 | Discharge: 2019-04-08 | Disposition: A | Discharge status: Home / Self Care | Payer: Medicare Other | Attending: Orthopedic Surgery | Admitting: Orthopedic Surgery

## 2019-04-08 ENCOUNTER — Encounter (HOSPITAL_COMMUNITY)
Admission: RE | Disposition: A | Discharge status: Home / Self Care | Payer: Self-pay | Source: Home / Self Care | Attending: Orthopedic Surgery

## 2019-04-08 ENCOUNTER — Encounter (HOSPITAL_COMMUNITY): Payer: Self-pay | Admitting: *Deleted

## 2019-04-08 ENCOUNTER — Ambulatory Visit (HOSPITAL_COMMUNITY): Payer: Medicare Other | Admitting: Anesthesiology

## 2019-04-08 ENCOUNTER — Other Ambulatory Visit: Payer: Self-pay

## 2019-04-08 DIAGNOSIS — G4733 Obstructive sleep apnea (adult) (pediatric): Secondary | ICD-10-CM | POA: Insufficient documentation

## 2019-04-08 DIAGNOSIS — I509 Heart failure, unspecified: Secondary | ICD-10-CM | POA: Diagnosis not present

## 2019-04-08 DIAGNOSIS — Z95810 Presence of automatic (implantable) cardiac defibrillator: Secondary | ICD-10-CM | POA: Diagnosis not present

## 2019-04-08 DIAGNOSIS — I130 Hypertensive heart and chronic kidney disease with heart failure and stage 1 through stage 4 chronic kidney disease, or unspecified chronic kidney disease: Secondary | ICD-10-CM | POA: Diagnosis not present

## 2019-04-08 DIAGNOSIS — M65352 Trigger finger, left little finger: Secondary | ICD-10-CM | POA: Insufficient documentation

## 2019-04-08 DIAGNOSIS — E1122 Type 2 diabetes mellitus with diabetic chronic kidney disease: Secondary | ICD-10-CM | POA: Diagnosis not present

## 2019-04-08 DIAGNOSIS — G5601 Carpal tunnel syndrome, right upper limb: Secondary | ICD-10-CM | POA: Insufficient documentation

## 2019-04-08 DIAGNOSIS — M65332 Trigger finger, left middle finger: Secondary | ICD-10-CM | POA: Diagnosis not present

## 2019-04-08 DIAGNOSIS — Z9981 Dependence on supplemental oxygen: Secondary | ICD-10-CM | POA: Diagnosis not present

## 2019-04-08 DIAGNOSIS — E1142 Type 2 diabetes mellitus with diabetic polyneuropathy: Secondary | ICD-10-CM | POA: Insufficient documentation

## 2019-04-08 DIAGNOSIS — I252 Old myocardial infarction: Secondary | ICD-10-CM | POA: Insufficient documentation

## 2019-04-08 DIAGNOSIS — Z9049 Acquired absence of other specified parts of digestive tract: Secondary | ICD-10-CM | POA: Insufficient documentation

## 2019-04-08 DIAGNOSIS — M25562 Pain in left knee: Secondary | ICD-10-CM | POA: Diagnosis not present

## 2019-04-08 DIAGNOSIS — M65321 Trigger finger, right index finger: Secondary | ICD-10-CM | POA: Diagnosis not present

## 2019-04-08 DIAGNOSIS — Z6834 Body mass index (BMI) 34.0-34.9, adult: Secondary | ICD-10-CM | POA: Insufficient documentation

## 2019-04-08 DIAGNOSIS — Z794 Long term (current) use of insulin: Secondary | ICD-10-CM | POA: Insufficient documentation

## 2019-04-08 DIAGNOSIS — N183 Chronic kidney disease, stage 3 (moderate): Secondary | ICD-10-CM | POA: Diagnosis not present

## 2019-04-08 DIAGNOSIS — I2510 Atherosclerotic heart disease of native coronary artery without angina pectoris: Secondary | ICD-10-CM | POA: Diagnosis not present

## 2019-04-08 HISTORY — PX: TRIGGER FINGER RELEASE: SHX641

## 2019-04-08 HISTORY — PX: CARPAL TUNNEL RELEASE: SHX101

## 2019-04-08 LAB — BASIC METABOLIC PANEL
Anion gap: 10 (ref 5–15)
BUN: 36 mg/dL — ABNORMAL HIGH (ref 8–23)
CO2: 25 mmol/L (ref 22–32)
Calcium: 8.8 mg/dL — ABNORMAL LOW (ref 8.9–10.3)
Chloride: 100 mmol/L (ref 98–111)
Creatinine, Ser: 1.53 mg/dL — ABNORMAL HIGH (ref 0.44–1.00)
GFR calc Af Amer: 40 mL/min — ABNORMAL LOW (ref >60)
GFR calc non Af Amer: 34 mL/min — ABNORMAL LOW (ref >60)
Glucose, Bld: 142 mg/dL — ABNORMAL HIGH (ref 70–99)
Potassium: 4.1 mmol/L (ref 3.5–5.1)
Sodium: 135 mmol/L (ref 135–145)

## 2019-04-08 LAB — CBC
HCT: 35.1 % — ABNORMAL LOW (ref 36.0–46.0)
Hemoglobin: 10.7 g/dL — ABNORMAL LOW (ref 12.0–15.0)
MCH: 26.4 pg (ref 26.0–34.0)
MCHC: 30.5 g/dL (ref 30.0–36.0)
MCV: 86.5 fL (ref 80.0–100.0)
Platelets: 197 10*3/uL (ref 150–400)
RBC: 4.06 MIL/uL (ref 3.87–5.11)
RDW: 14.8 % (ref 11.5–15.5)
WBC: 8.3 10*3/uL (ref 4.0–10.5)
nRBC: 0 % (ref 0.0–0.2)

## 2019-04-08 LAB — GLUCOSE, CAPILLARY
Glucose-Capillary: 138 mg/dL — ABNORMAL HIGH (ref 70–99)
Glucose-Capillary: 179 mg/dL — ABNORMAL HIGH (ref 70–99)

## 2019-04-08 SURGERY — RELEASE, CARPAL TUNNEL, ENDOSCOPIC
Anesthesia: Monitor Anesthesia Care | Site: Hand | Laterality: Right

## 2019-04-08 MED ORDER — LIDOCAINE HCL 2 % IJ SOLN
INTRAMUSCULAR | Status: AC
  Filled 2019-04-08: qty 20

## 2019-04-08 MED ORDER — LACTATED RINGERS IV SOLN
INTRAVENOUS | Status: DC
  Administered 2019-04-08: 08:00:00 via INTRAVENOUS

## 2019-04-08 MED ORDER — MIDAZOLAM HCL 5 MG/5ML IJ SOLN
INTRAMUSCULAR | Status: DC | PRN
  Administered 2019-04-08: 1 mg via INTRAVENOUS

## 2019-04-08 MED ORDER — BUPIVACAINE-EPINEPHRINE (PF) 0.5% -1:200000 IJ SOLN
INTRAMUSCULAR | Status: DC | PRN
  Administered 2019-04-08: 5 mL

## 2019-04-08 MED ORDER — OXYCODONE HCL 5 MG PO TABS
ORAL_TABLET | ORAL | Status: AC
  Filled 2019-04-08: qty 1

## 2019-04-08 MED ORDER — ONDANSETRON HCL 4 MG/2ML IJ SOLN
INTRAMUSCULAR | Status: DC | PRN
  Administered 2019-04-08: 4 mg via INTRAVENOUS

## 2019-04-08 MED ORDER — ACETAMINOPHEN 325 MG PO TABS: 650.0000 mg | ORAL_TABLET | Freq: Four times a day (QID) | ORAL | Status: DC

## 2019-04-08 MED ORDER — CEFAZOLIN SODIUM-DEXTROSE 2-4 GM/100ML-% IV SOLN
2.0000 g | INTRAVENOUS | Status: AC
  Administered 2019-04-08: 2 g via INTRAVENOUS
  Filled 2019-04-08: qty 100

## 2019-04-08 MED ORDER — BUPIVACAINE-EPINEPHRINE (PF) 0.5% -1:200000 IJ SOLN
INTRAMUSCULAR | Status: AC
  Filled 2019-04-08: qty 30

## 2019-04-08 MED ORDER — LIDOCAINE 2% (20 MG/ML) 5 ML SYRINGE
INTRAMUSCULAR | Status: DC | PRN
  Administered 2019-04-08: 40 mg via INTRAVENOUS

## 2019-04-08 MED ORDER — PROPOFOL 10 MG/ML IV BOLUS
INTRAVENOUS | Status: AC
  Filled 2019-04-08: qty 20

## 2019-04-08 MED ORDER — FENTANYL CITRATE (PF) 100 MCG/2ML IJ SOLN: 25.0000 ug | INTRAMUSCULAR | Status: DC | PRN

## 2019-04-08 MED ORDER — FENTANYL CITRATE (PF) 100 MCG/2ML IJ SOLN
INTRAMUSCULAR | Status: DC | PRN
  Administered 2019-04-08 (×2): 50 ug via INTRAVENOUS

## 2019-04-08 MED ORDER — MIDAZOLAM HCL 2 MG/2ML IJ SOLN
INTRAMUSCULAR | Status: AC
  Filled 2019-04-08: qty 2

## 2019-04-08 MED ORDER — OXYCODONE HCL 5 MG PO TABS
5.0000 mg | ORAL_TABLET | Freq: Once | ORAL | Status: AC | PRN
  Administered 2019-04-08: 5 mg via ORAL

## 2019-04-08 MED ORDER — OXYCODONE HCL 5 MG/5ML PO SOLN: 5.0000 mg | Freq: Once | ORAL | Status: AC | PRN

## 2019-04-08 MED ORDER — POVIDONE-IODINE 10 % EX SWAB: 2.0000 "application " | Freq: Once | CUTANEOUS | Status: DC

## 2019-04-08 MED ORDER — BUPIVACAINE-EPINEPHRINE 0.5% -1:200000 IJ SOLN
INTRAMUSCULAR | Status: AC
  Filled 2019-04-08: qty 1

## 2019-04-08 MED ORDER — LIDOCAINE HCL 2 % IJ SOLN
INTRAMUSCULAR | Status: DC | PRN
  Administered 2019-04-08: 5 mL

## 2019-04-08 MED ORDER — FENTANYL CITRATE (PF) 250 MCG/5ML IJ SOLN
INTRAMUSCULAR | Status: AC
  Filled 2019-04-08: qty 5

## 2019-04-08 MED ORDER — PROPOFOL 10 MG/ML IV BOLUS
INTRAVENOUS | Status: DC | PRN
  Administered 2019-04-08 (×3): 20 mg via INTRAVENOUS

## 2019-04-08 SURGICAL SUPPLY — 46 items
APPLICATOR COTTON TIP 6 STRL (MISCELLANEOUS) ×1 IMPLANT
APPLICATOR COTTON TIP 6IN STRL (MISCELLANEOUS) ×3
BLADE HOOK ENDO STRL (BLADE) ×3 IMPLANT
BLADE SURG 15 STRL LF DISP TIS (BLADE) ×1 IMPLANT
BLADE SURG 15 STRL SS (BLADE) ×2
BLADE TRIANGLE EPF/EGR ENDO (BLADE) ×3 IMPLANT
BNDG COHESIVE 4X5 TAN STRL (GAUZE/BANDAGES/DRESSINGS) ×3 IMPLANT
BNDG ESMARK 4X9 LF (GAUZE/BANDAGES/DRESSINGS) ×3 IMPLANT
BNDG GAUZE ELAST 4 BULKY (GAUZE/BANDAGES/DRESSINGS) ×3 IMPLANT
CHLORAPREP W/TINT 26 (MISCELLANEOUS) ×3 IMPLANT
CORD BIPOLAR FORCEPS 12FT (ELECTRODE) IMPLANT
COVER BACK TABLE 60X90IN (DRAPES) ×3 IMPLANT
COVER MAYO STAND STRL (DRAPES) ×3 IMPLANT
COVER WAND RF STERILE (DRAPES) ×3 IMPLANT
CUFF TOURN SGL QUICK 18X4 (TOURNIQUET CUFF) IMPLANT
DRAPE HALF SHEET 40X57 (DRAPES) ×3 IMPLANT
DRAPE SURG 17X23 STRL (DRAPES) ×3 IMPLANT
DRSG ADAPTIC 3X8 NADH LF (GAUZE/BANDAGES/DRESSINGS) ×3 IMPLANT
DRSG EMULSION OIL 3X3 NADH (GAUZE/BANDAGES/DRESSINGS) ×3 IMPLANT
GAUZE SPONGE 4X4 12PLY STRL (GAUZE/BANDAGES/DRESSINGS) ×3 IMPLANT
GAUZE SPONGE 4X4 12PLY STRL LF (GAUZE/BANDAGES/DRESSINGS) ×3 IMPLANT
GLOVE BIO SURGEON STRL SZ7.5 (GLOVE) ×3 IMPLANT
GLOVE BIOGEL PI IND STRL 7.0 (GLOVE) ×1 IMPLANT
GLOVE BIOGEL PI IND STRL 8 (GLOVE) ×1 IMPLANT
GLOVE BIOGEL PI INDICATOR 7.0 (GLOVE) ×2
GLOVE BIOGEL PI INDICATOR 8 (GLOVE) ×2
GLOVE ECLIPSE 6.5 STRL STRAW (GLOVE) ×3 IMPLANT
GOWN STRL REUS W/ TWL LRG LVL3 (GOWN DISPOSABLE) ×2 IMPLANT
GOWN STRL REUS W/ TWL XL LVL3 (GOWN DISPOSABLE) ×1 IMPLANT
GOWN STRL REUS W/TWL LRG LVL3 (GOWN DISPOSABLE) ×4
GOWN STRL REUS W/TWL XL LVL3 (GOWN DISPOSABLE) ×2
KIT BASIN OR (CUSTOM PROCEDURE TRAY) ×3 IMPLANT
NEEDLE HYPO 25X1 1.5 SAFETY (NEEDLE) IMPLANT
NS IRRIG 1000ML POUR BTL (IV SOLUTION) ×3 IMPLANT
PAD CAST 4YDX4 CTTN HI CHSV (CAST SUPPLIES) ×1 IMPLANT
PADDING CAST ABS 4INX4YD NS (CAST SUPPLIES)
PADDING CAST ABS COTTON 4X4 ST (CAST SUPPLIES) IMPLANT
PADDING CAST COTTON 4X4 STRL (CAST SUPPLIES) ×2
STOCKINETTE 6  STRL (DRAPES) ×2
STOCKINETTE 6 STRL (DRAPES) ×1 IMPLANT
SUT VICRYL RAPIDE 4/0 PS 2 (SUTURE) ×6 IMPLANT
SYR 10ML LL (SYRINGE) IMPLANT
SYR BULB 3OZ (MISCELLANEOUS) ×3 IMPLANT
TOWEL GREEN STERILE FF (TOWEL DISPOSABLE) ×6 IMPLANT
TOWEL OR NON WOVEN STRL DISP B (DISPOSABLE) ×3 IMPLANT
UNDERPAD 30X30 (UNDERPADS AND DIAPERS) ×3 IMPLANT

## 2019-04-08 NOTE — Interval H&P Note (Signed)
History and Physical Interval Note:  04/08/2019 7:27 AM  Crystal [name]  has presented today for surgery, with the diagnosis of RIGHT CARPAL TUNNEL SYNDROME G56.01, RIGHT INDEX TRIGGER FINGER M65.34.  The various methods of treatment have been discussed with the patient and family. After consideration of risks, benefits and other options for treatment, the patient has consented to  Procedure(s): RIGHT CARPAL TUNNEL RELEASE ENDOSCOPIC (Right) RIGHT INDEX RELEASE TRIGGER FINGER/A-1 PULLEY (Right) as a surgical intervention.  The patient's history has been reviewed, patient examined, no change in status, stable for surgery.  I have reviewed the patient's chart and labs.  Questions were answered to the patient's satisfaction.     Jolyn Nap

## 2019-04-08 NOTE — Anesthesia Procedure Notes (Signed)
Procedure Name: MAC Date/Time: 04/08/2019 8:55 AM Performed by: Lieutenant Diego, CRNA Pre-anesthesia Checklist: Patient identified, Emergency Drugs available, Suction available, Patient being monitored and Timeout performed Patient Re-evaluated:Patient Re-evaluated prior to induction Oxygen Delivery Method: Simple face mask Preoxygenation: Pre-oxygenation with 100% oxygen Induction Type: IV induction

## 2019-04-08 NOTE — Transfer of Care (Signed)
Immediate Anesthesia Transfer of Care Note  Patient: Crystal [name]  Procedure(s) Performed: RIGHT CARPAL TUNNEL RELEASE ENDOSCOPIC (Right Hand) RIGHT INDEX RELEASE TRIGGER FINGER/A-1 PULLEY (Right Hand)  Patient Location: PACU  Anesthesia Type:MAC  Level of Consciousness: awake  Airway & Oxygen Therapy: Patient Spontanous Breathing and Patient connected to face mask oxygen  Post-op Assessment: Report given to RN and Post -op Vital signs reviewed and stable  Post vital signs: Reviewed and stable  Last Vitals:  Vitals Value Taken Time  BP 145/64 04/08/19 0920  Temp    Pulse 67 04/08/19 0920  Resp 9 04/08/19 0920  SpO2 99 % 04/08/19 0920  Vitals shown include unvalidated device data.  Last Pain:  Vitals:   04/08/19 0726  TempSrc:   PainSc: 6       Patients Stated Pain Goal: 4 (22/48/25 0037)  Complications: No apparent anesthesia complications

## 2019-04-08 NOTE — Op Note (Signed)
04/08/2019  7:27 AM  PATIENT:  Crystal [name]  68 y.o. female  PRE-OPERATIVE DIAGNOSIS:  RIGHT CARPAL TUNNEL SYNDROME G56.01, RIGHT INDEX TRIGGER FINGER M65.34  POST-OPERATIVE DIAGNOSIS:  Same  PROCEDURE:  Procedure(s): RIGHT CARPAL TUNNEL RELEASE ENDOSCOPIC RIGHT INDEX RELEASE TRIGGER FINGER/A-1 PULLEY  SURGEON:  Surgeon(s): Milly Jakob, MD  PHYSICIAN ASSISTANT: [name], OPA-C  ANESTHESIA:  Local / MAC  SPECIMENS:  None  DRAINS:   none  EBL:  less than 50 mL  PREOPERATIVE INDICATIONS:  Crystal [name] is a  68 y.o. female with a diagnosis of RIGHT CARPAL TUNNEL SYNDROME G56.01, RIGHT INDEX TRIGGER FINGER M65.34 who failed conservative measures and elected for surgical management.    The risks benefits and alternatives were discussed with the patient preoperatively including but not limited to the risks of infection, bleeding, nerve injury, cardiopulmonary complications, the need for revision surgery, among others, and the patient verbalized understanding and consented to proceed.  OPERATIVE IMPLANTS: None  OPERATIVE FINDINGS: Tight carpal tunnel, acceptably decompressed following release  OPERATIVE PROCEDURE:  After receiving prophylactic antibiotics, the patient was escorted to the operative theatre and placed in a supine position.   A surgical "time-out" was performed during which the planned procedure, proposed operative site, and the correct patient identity were compared to the operative consent and agreement confirmed by the circulating nurse according to current facility policy.  Following application of a tourniquet to the operative extremity, the planned incisions were marked and anesthetized with a mixture of lidocaine & bupivicaine containing epinephrine.  The exposed skin was prepped with Chloraprep and draped in the usual sterile fashion.  The limb was exsanguinated with an Esmarch bandage and the tourniquet inflated to approximately 120mHg higher than  systolic BP.   The R IF trigger digit was addressed first: An oblique incision was made over the A1 pulley of the index finger, sharply dividing the skin with a scalpel.  Care was taken to protect the underlying neurovascular structures which were retracted.  The A1 pulley was split down the midline, as well as some additional crossing bands more proximally.  The tendons were lifted out of the bed, gently cleaned of slightly hypertrophic synovium and returned to the bed.  The wound was irrigated.  Attention was shifted the the R ECTR:  Both incisions were made sharply. Subcutaneous tissues were dissected with blunt and spreading dissection. At the proximal incision, the deep forearm fascia was split in line with the skin incision the distal edge grasped with a hemostat. At the mid palmar incision, the palmar fascia was split in line with the skin incision, revealing the underlying superficial palmar arch which was visualized with loupe assisted magnification. The synovial reflector was then introduced into the proximal incision and passed through the carpal canal, uses to reflect synovium from the deep surface of the transverse carpal ligament. It was removed and replaced with the slotted cannula and blunt obturator, passing from proximal to distal and exiting the distal wound superficial to the superficial palmar arch. The obturator was removed and the camera inserted. Visualization of the ligament was acceptable. The triangle shape blade was then inserted distally, advanced to the midportion of the ligament, and there used to create a perforation in the ligament. This instrument was removed and the hooked nstrument was inserted. It was placed into the perforation in the ligament and withdrawn distally, completing transection of the distal half of the ligament. The camera was then removed and placed into the distal end of the  cannula. The hooked instrument was placed into the proximal end and advanced  facility to be placed into the apex of the V. which had been formed to the distal hemi-transection of the ligament. It was withdrawn proximally, completing transection of the ligament. The adequacy of the release was judged with the scope and the instruments used as a probe. All of the endoscopic instruments are removed and the adequacy of release was again judged from the proximal incision perspective with direct loupe assisted visualization. In addition, the proximal forearm fascia was split for 2 inches proximal to the proximal incision under direct visualization using a sliding scissor technique. The tourniquet was released, the wound copiously irrigated. The skin was then closed with 4-0 Vicryl Rapide interrupted sutures. A light dressing was applied.  DISPOSITION:  The patient will be discharged home today, returning in 10-15 days for re-assessment.

## 2019-04-08 NOTE — Anesthesia Preprocedure Evaluation (Signed)
Anesthesia Evaluation  Patient identified by MRN, date of birth, ID band Patient awake    Reviewed: Allergy & Precautions, H&P , NPO status , Patient's Chart, lab work & pertinent test results  History of Anesthesia Complications (+) PONV and history of anesthetic complications  Airway Mallampati: II   Neck ROM: full    Dental   Pulmonary asthma , sleep apnea ,    breath sounds clear to auscultation       Cardiovascular hypertension, + CAD, + Past MI and +CHF  + pacemaker + Cardiac Defibrillator  Rhythm:regular Rate:Normal     Neuro/Psych PSYCHIATRIC DISORDERS Anxiety Depression  Neuromuscular disease    GI/Hepatic GERD  ,  Endo/Other  diabetesHypothyroidism Morbid obesity  Renal/GU Renal InsufficiencyRenal disease     Musculoskeletal  (+) Arthritis ,   Abdominal   Peds  Hematology   Anesthesia Other Findings   Reproductive/Obstetrics                             Anesthesia Physical Anesthesia Plan  ASA: IV  Anesthesia Plan: MAC   Post-op Pain Management:    Induction: Intravenous  PONV Risk Score and Plan: 3 and Ondansetron, Propofol infusion, Midazolam and Treatment may vary due to age or medical condition  Airway Management Planned: Simple Face Mask  Additional Equipment:   Intra-op Plan:   Post-operative Plan:   Informed Consent: I have reviewed the patients History and Physical, chart, labs and discussed the procedure including the risks, benefits and alternatives for the proposed anesthesia with the patient or authorized representative who has indicated his/her understanding and acceptance.       Plan Discussed with: CRNA, Anesthesiologist and Surgeon  Anesthesia Plan Comments:         Anesthesia Quick Evaluation

## 2019-04-08 NOTE — Discharge Instructions (Signed)
Discharge Instructions   You have a light dressing on your hand.  You may begin gentle motion of your fingers and hand immediately, but you should not do any heavy lifting or gripping.  Elevate your hand to reduce pain & swelling of the digits.  Ice over the operative site may be helpful to reduce pain & swelling.  DO NOT USE HEAT. Use Tylenol 650 mg every 6 hours for pain control. Take your Tramadol additionally as needed for severe pain. Leave the dressing in place until the third day after your surgery and then remove it, leaving it open to air.  After the bandage has been removed you may shower, regularly washing the incision and letting the water run over it, but not submerging it (no swimming, soaking it in dishwater, etc.) You may drive a car when you are off of prescription pain medications and can safely control your vehicle with both hands. We will address whether therapy will be required or not when you return to the office. You may have already made your follow-up appointment when we completed your preop visit.  If not, please call our office today or the next business day to make your return appointment for 10-15 days after surgery.   Please call [phone] during normal business hours or [phone] after hours for any problems. Including the following:  - excessive redness of the incisions - drainage for more than 4 days - fever of more than 101.5 F  *Please note that pain medications will not be refilled after hours or on weekends.

## 2019-04-09 ENCOUNTER — Encounter (HOSPITAL_COMMUNITY): Payer: Self-pay | Admitting: Orthopedic Surgery

## 2019-04-09 NOTE — Anesthesia Postprocedure Evaluation (Signed)
Anesthesia Post Note  Patient: Crystal [name]  Procedure(s) Performed: RIGHT CARPAL TUNNEL RELEASE ENDOSCOPIC (Right Hand) RIGHT INDEX RELEASE TRIGGER FINGER/A-1 PULLEY (Right Hand)     Patient location during evaluation: PACU Anesthesia Type: MAC Level of consciousness: awake and alert Pain management: pain level controlled Vital Signs Assessment: post-procedure vital signs reviewed and stable Respiratory status: spontaneous breathing, nonlabored ventilation, respiratory function stable and patient connected to nasal cannula oxygen Cardiovascular status: stable and blood pressure returned to baseline Postop Assessment: no apparent nausea or vomiting Anesthetic complications: no    Last Vitals:  Vitals:   04/08/19 0935 04/08/19 0950  BP: 125/66 128/64  Pulse: 65 67  Resp: 10 12  Temp:  36.8 C  SpO2: 95% 97%    Last Pain:  Vitals:   04/08/19 0950  TempSrc:   PainSc: 0-No pain                 Tammee Thielke S

## 2019-04-10 ENCOUNTER — Encounter: Payer: Self-pay | Admitting: Cardiology

## 2019-04-10 NOTE — Progress Notes (Signed)
Remote ICD transmission.   

## 2019-04-12 ENCOUNTER — Other Ambulatory Visit: Payer: Self-pay | Admitting: Internal Medicine

## 2019-04-12 DIAGNOSIS — G4733 Obstructive sleep apnea (adult) (pediatric): Secondary | ICD-10-CM | POA: Diagnosis not present

## 2019-04-12 DIAGNOSIS — I509 Heart failure, unspecified: Secondary | ICD-10-CM | POA: Diagnosis not present

## 2019-04-12 DIAGNOSIS — Z794 Long term (current) use of insulin: Secondary | ICD-10-CM | POA: Diagnosis not present

## 2019-04-12 DIAGNOSIS — K219 Gastro-esophageal reflux disease without esophagitis: Secondary | ICD-10-CM | POA: Diagnosis not present

## 2019-04-12 DIAGNOSIS — E1159 Type 2 diabetes mellitus with other circulatory complications: Secondary | ICD-10-CM | POA: Diagnosis not present

## 2019-04-12 DIAGNOSIS — D649 Anemia, unspecified: Secondary | ICD-10-CM | POA: Diagnosis not present

## 2019-04-12 DIAGNOSIS — N184 Chronic kidney disease, stage 4 (severe): Secondary | ICD-10-CM | POA: Diagnosis not present

## 2019-04-12 DIAGNOSIS — E1129 Type 2 diabetes mellitus with other diabetic kidney complication: Secondary | ICD-10-CM | POA: Diagnosis not present

## 2019-04-12 DIAGNOSIS — M109 Gout, unspecified: Secondary | ICD-10-CM | POA: Diagnosis not present

## 2019-04-12 DIAGNOSIS — I130 Hypertensive heart and chronic kidney disease with heart failure and stage 1 through stage 4 chronic kidney disease, or unspecified chronic kidney disease: Secondary | ICD-10-CM | POA: Diagnosis not present

## 2019-04-12 DIAGNOSIS — Z95810 Presence of automatic (implantable) cardiac defibrillator: Secondary | ICD-10-CM | POA: Diagnosis not present

## 2019-04-12 DIAGNOSIS — Z1231 Encounter for screening mammogram for malignant neoplasm of breast: Secondary | ICD-10-CM

## 2019-04-19 DIAGNOSIS — E1159 Type 2 diabetes mellitus with other circulatory complications: Secondary | ICD-10-CM | POA: Diagnosis not present

## 2019-04-29 DIAGNOSIS — F329 Major depressive disorder, single episode, unspecified: Secondary | ICD-10-CM | POA: Diagnosis not present

## 2019-04-29 DIAGNOSIS — N184 Chronic kidney disease, stage 4 (severe): Secondary | ICD-10-CM | POA: Diagnosis not present

## 2019-04-29 DIAGNOSIS — I130 Hypertensive heart and chronic kidney disease with heart failure and stage 1 through stage 4 chronic kidney disease, or unspecified chronic kidney disease: Secondary | ICD-10-CM | POA: Diagnosis not present

## 2019-04-29 DIAGNOSIS — B373 Candidiasis of vulva and vagina: Secondary | ICD-10-CM | POA: Diagnosis not present

## 2019-04-29 DIAGNOSIS — F419 Anxiety disorder, unspecified: Secondary | ICD-10-CM | POA: Diagnosis not present

## 2019-05-07 DIAGNOSIS — G5601 Carpal tunnel syndrome, right upper limb: Secondary | ICD-10-CM | POA: Diagnosis not present

## 2019-05-07 DIAGNOSIS — M65321 Trigger finger, right index finger: Secondary | ICD-10-CM | POA: Diagnosis not present

## 2019-05-13 NOTE — Progress Notes (Signed)
Cardiology Office Note   Date:  05/14/2019   ID:  Crystal [name], DOB 1950-12-21, MRN [id number]  PCP:  Crist Infante, MD    No chief complaint on file.  Nonischemic cardiomyopathy  Wt Readings from Last 3 Encounters:  05/14/19 228 lb 12.8 oz (103.8 kg)  04/08/19 230 lb (104.3 kg)  11/20/18 220 lb (99.8 kg)       History of Present Illness: Crystal [name] is a 68 y.o. female  With a h/o, per the old chart,: "Chronic systolic heart failure  - long hx of NICM dating back to 2005, with reported LVEF at that time 24% - cath at that time showed no significant CAD.  - She has a chronic LBBB. Had a BiV AICD placed previously with epicardial LV lead with subsequent normalization of LVEF - LVEF improved with BiV pacing to >55%.  - from notes device reached ERI, her RV lead has increasing thresholds around that time. It was decided not to replace the device since normal LVEF and follow her clinically.   - repeat echo 04/2014 LVEF 50-55% - questionable hx of angioedema on ACE-Is, she states has been on ARBs and has tolerated. "  She has CKD as well.   She had echo, cath in 2018 in [place], [place].  No CAD nad EF 50% per her report.  SHe has had other medical problems.  She had a colostomy for colitis.  It has been reversed.    She has been gaining weight since that time.  She has gained 30 lbs.  SHe had carpal tunnel surgery.  SHe has been followed by Dr. Caryl Comes as well. 11/19 cath showed:  Prox LAD lesion is 25% stenosed.  Ost 1st Diag lesion is 25% stenosed.  Mid LM lesion is 10% stenosed.  LV end diastolic pressure is mildly elevated. LVEDP 17 mm Hg.  There is no aortic valve stenosis.  Hemodynamic findings consistent with mild pulmonary hypertension.  Ao sat 98%, PA sat 66%, CO 4.79 L/min; CI 2.2, mean PA 30 mm Hg; mean PCWP 22 mm Hg   No significant CAD.   Medical therapy for LV dysfunction.   Echo in 09/2018 showed:  The left ventricle has severely  reduced systolic function, with an ejection fraction of 25-30%. The cavity size was moderately dilated. There is mildly increased left ventricular wall thickness. Left ventricular diastolic Doppler parameters are  consistent with impaired relaxation. Left ventricular diffuse hypokinesis.  2. The right ventricle has normal systolic function. The cavity was normal. There is no increase in right ventricular wall thickness.  3. Mild thickening of the mitral valve leaflet. There is mild mitral annular calcification present.  4. The tricuspid valve is grossly normal.  5. The aortic valve is tricuspid Mild thickening of the aortic valve.  6. There is mild dilatation of the aortic root and of the ascending aorta measuring 39 mm.  7. Severe global reduction in LV systolic function; mild diastolic dysfunction; moderate LVE; mild LVH; mild MR.  Since the last visit, she has done well.  She walks 0.5-1 mile.  Denies : Chest pain. Dizziness. Leg edema. Nitroglycerin use. Orthopnea. Palpitations. Paroxysmal nocturnal dyspnea. Shortness of breath. Syncope.   No sensation of fluid retention.  AKI with increased diuretic.  Resolved.   BPs at home come down with Coreg.  120/70s range.      Past Medical History:  Diagnosis Date  . AICD (automatic cardioverter/defibrillator) present    high RV threshold chronically,  device was turned off in 2014; Device battery has been dead x 7 years; "turned it back on 06/22/2018"  . Anemia, iron deficiency   . Angioedema    felt to likely be due to ace inhibitors but says she has had this even off of medicines, appears to be tolerating ARBs chronically  . Anxiety   . Arthritis    "hands, legs, arms; bad in my back" (06/22/2018)  . Asthmatic bronchitis with status asthmaticus   . Bradycardia   . CHF (congestive heart failure) ([place])   . Chronic lower back pain   . Chronic pain   . Chronic renal insufficiency   . CKD (chronic kidney disease), stage III   . Coronary  artery disease    Mild nonobstructive (30% LAD, 30% RCA) by 09/01/16 cath at [facility]  . Degenerative joint disease   . Depression   . Diabetes mellitus, type 2 ([place])   . Diabetic peripheral neuropathy ([place])   . Dizziness   . Dyspnea on exertion   . Family history of adverse reaction to anesthesia    Mother has nausea  . GERD (gastroesophageal reflux disease)   . Gout   . Heart valve disorder   . History of blood transfusion 1981; ~ 2005; 12/2016   "childbirth; defibrillator OR; colostomy OR"  . History of mononucleosis 03/2014  . Hyperlipidemia   . Hypertension   . Hypothyroid   . Insomnia   . Intervertebral disc degeneration   . Ischemic cardiomyopathy   . LBBB (left bundle branch block)   . Myocardial infarction ([place]) dx'd ~ 2005  . Nonischemic cardiomyopathy ([place])    s/p ICD in 2005. EF has since recovered  . Obesity   . On home oxygen therapy    "2L at night" (06/22/2018)  . OSA on CPAP   . Pneumonia    "couple times; last time was 12/2016" (06/22/2018)  . PONV (postoperative nausea and vomiting)   . Presence of permanent cardiac pacemaker    [employer]  . Swelling   . Syncope   . Systemic hypertension   . Vitamin D deficiency     Past Surgical History:  Procedure Laterality Date  . APPENDECTOMY  07/13/2017  . BLADDER SUSPENSION  1990   "w/hysterectomy"  . BOWEL RESECTION N/A 07/09/2018   Procedure: SMALL BOWEL RESECTION;  Surgeon: Judeth Horn, MD;  Location: [facility];  Service: General;  Laterality: N/A;  . CARDIAC CATHETERIZATION  2005   no obstructive CAD per patient  . CARDIAC CATHETERIZATION  2018  . CARDIAC DEFIBRILLATOR PLACEMENT  2005   BiV ICD implanted,  LV lead is an epicardial lead  . CARPAL TUNNEL RELEASE Left 07/25/2014   Dr.Williamson   . CARPAL TUNNEL RELEASE Right 04/08/2019   Procedure: RIGHT CARPAL TUNNEL RELEASE ENDOSCOPIC;  Surgeon: Milly Jakob, MD;  Location: [facility];  Service: Orthopedics;  Laterality: Right;  . CATARACT  EXTRACTION W/ INTRAOCULAR LENS  IMPLANT, BILATERAL Bilateral   . COLONIC STENT PLACEMENT N/A 08/31/2017   Procedure: COLONIC STENT PLACEMENT;  Surgeon: Carol Ada, MD;  Location: [facility];  Service: Endoscopy;  Laterality: N/A;  . COLONOSCOPY     2010-2011 Dr.Kipreos   . COLOSTOMY  12/2016   Archie Endo 01/26/2017  . COLOSTOMY REVERSAL N/A 07/13/2017   Procedure: COLOSTOMY REVERSAL;  Surgeon: Judeth Horn, MD;  Location: [facility];  Service: General;  Laterality: N/A;  . CYST REMOVAL HAND Right 05/2013   thumb  . DILATION AND CURETTAGE OF UTERUS  X  5-6  . EXCISION MASS ABDOMINAL N/A 07/09/2018   Procedure: EXPLORATION OF  ABDOMINAL WOUND ERAS PATHWAY;  Surgeon: Judeth Horn, MD;  Location: [facility];  Service: General;  Laterality: N/A;  . FLEXIBLE SIGMOIDOSCOPY N/A 08/24/2017   Procedure: Beryle Quant;  Surgeon: Carol Ada, MD;  Location: [facility];  Service: Endoscopy;  Laterality: N/A;  . FLEXIBLE SIGMOIDOSCOPY N/A 08/31/2017   Procedure: FLEXIBLE SIGMOIDOSCOPY;  Surgeon: Carol Ada, MD;  Location: [facility];  Service: Endoscopy;  Laterality: N/A;  stent placement  . IMPLANTABLE CARDIOVERTER DEFIBRILLATOR GENERATOR CHANGE  2008  . INSERTION OF MESH N/A 07/09/2018   Procedure: INSERTION OF VICRYL MESH;  Surgeon: Judeth Horn, MD;  Location: [facility];  Service: General;  Laterality: N/A;  . LAPAROTOMY N/A 09/18/2017   Procedure: EXPLORATORY LAPAROTOMY;  Surgeon: Judeth Horn, MD;  Location: [facility];  Service: General;  Laterality: N/A;  . LEAD REVISION  06/22/2018  . LEAD REVISION/REPAIR N/A 06/22/2018   Procedure: LEAD REVISION/REPAIR;  Surgeon: Deboraha Sprang, MD;  Location: [facility];  Service: Cardiovascular;  Laterality: N/A;  . LYSIS OF ADHESION N/A 09/18/2017   Procedure: LYSIS OF ADHESION;  Surgeon: Judeth Horn, MD;  Location: [facility];  Service: General;  Laterality: N/A;  . LYSIS OF ADHESION N/A 07/09/2018   Procedure: LYSIS OF ADHESION;  Surgeon: Judeth Horn, MD;   Location: [facility];  Service: General;  Laterality: N/A;  . PARTIAL COLECTOMY N/A 09/18/2017   Procedure: ILEOCOLECTOMY;  Surgeon: Judeth Horn, MD;  Location: [facility];  Service: General;  Laterality: N/A;  . PARTIAL COLECTOMY  09/25/2017  . RIGHT/LEFT HEART CATH AND CORONARY ANGIOGRAPHY N/A 06/01/2018   Procedure: RIGHT/LEFT HEART CATH AND CORONARY ANGIOGRAPHY;  Surgeon: Jettie Booze, MD;  Location: [facility];  Service: Cardiovascular;  Laterality: N/A;  . SIGMOIDOSCOPY N/A 09/18/2017   Procedure: SIGMOIDOSCOPY;  Surgeon: Judeth Horn, MD;  Location: [facility];  Service: General;  Laterality: N/A;  . SMALL INTESTINE SURGERY  07/09/2018   EXPLORATION OF ABDOMINAL WOUND ERAS PATHWAYN/; MESH; LYSIS OF ADHESIONS  . TOTAL ABDOMINAL HYSTERECTOMY  1990  . TRIGGER FINGER RELEASE Right 05/213  . TRIGGER FINGER RELEASE Right 04/08/2019   Procedure: RIGHT INDEX RELEASE TRIGGER FINGER/A-1 PULLEY;  Surgeon: Milly Jakob, MD;  Location: [facility];  Service: Orthopedics;  Laterality: Right;  . TUBAL LIGATION  1980s  . VESICOVAGINAL FISTULA CLOSURE W/ TAH       Current Outpatient Medications  Medication Sig Dispense Refill  . acetaminophen (TYLENOL) 325 MG tablet Take 2 tablets (650 mg total) by mouth every 6 (six) hours.    Marland Kitchen albuterol (PROVENTIL HFA;VENTOLIN HFA) 108 (90 Base) MCG/ACT inhaler Inhale 2 puffs into the lungs every 6 (six) hours as needed for wheezing or shortness of breath.     . allopurinol (ZYLOPRIM) 100 MG tablet TAKE 1 TABLET(100 MG) BY MOUTH DAILY 90 tablet 1  . Calcium Carbonate-Vitamin D (CALTRATE 600+D) 600-400 MG-UNIT tablet Take 1 tablet by mouth 2 (two) times daily.    . carvedilol (COREG) 25 MG tablet Take 25 mg by mouth 2 (two) times daily with a meal.    . CRESTOR 10 MG tablet Take 1 tablet (10 mg total) by mouth at bedtime. 30 tablet 0  . DULoxetine (CYMBALTA) 30 MG capsule Take 1 capsule (30 mg total) by mouth daily. 90 capsule 0  . gabapentin (NEURONTIN) 300 MG  capsule Take 300 mg by mouth at bedtime.    . insulin regular human CONCENTRATED (HUMULIN  R U-500 KWIKPEN) 500 UNIT/ML kwikpen Inject 110 Units into the skin 3 (three) times daily with meals.    Marland Kitchen levothyroxine (SYNTHROID, LEVOTHROID) 88 MCG tablet Take 88 mcg by mouth daily before breakfast.     . loratadine (CLARITIN) 10 MG tablet Take 10 mg by mouth daily.    . montelukast (SINGULAIR) 10 MG tablet Take 10 mg by mouth at bedtime.    Marland Kitchen omega-3 acid ethyl esters (LOVAZA) 1 g capsule Take 4 g by mouth at bedtime.     . pantoprazole (PROTONIX) 40 MG tablet Take 40 mg by mouth every evening.  2  . temazepam (RESTORIL) 15 MG capsule Take 1 capsule (15 mg total) by mouth at bedtime as needed for sleep. 30 capsule 0  . torsemide (DEMADEX) 20 MG tablet Take 1 tablet (20 mg total) by mouth daily. 90 tablet 2  . traMADol (ULTRAM) 50 MG tablet Take 1 tablet (50 mg total) by mouth every 6 (six) hours as needed for moderate pain or severe pain. 30 tablet 0   No current facility-administered medications for this visit.     Allergies:   Ace inhibitors, Glucophage [metformin hcl], Telmisartan, Advicor [niacin-lovastatin er], Bystolic [nebivolol hcl], Erythromycin, Lipitor [atorvastatin], Lopid [gemfibrozil], and Statins    Social History:  The patient  reports that she has never smoked. She has never used smokeless tobacco. She reports that she does not drink alcohol or use drugs.   Family History:  The patient's family history includes Anxiety disorder in her son; Arrhythmia in her mother; Asthma in her mother; Atrial fibrillation in her daughter; Breast cancer in her maternal grandmother; Cancer in her brother and father; Dementia in her brother; Diabetes in her daughter; Diabetes Mellitus II in her mother and another family member; GER disease in her daughter; Heart disease in her father and another family member; Hypertension in her brother, daughter, daughter, daughter, father, mother, and son;  Hypothyroidism in her son.    ROS:  Please see the history of present illness.   Otherwise, review of systems are positive for fatigue on higher carvedilol.   All other systems are reviewed and negative.    PHYSICAL EXAM: VS:  BP 124/66   Pulse 94   Ht 5\' 4"  (1.626 m)   Wt 228 lb 12.8 oz (103.8 kg)   SpO2 92%   BMI 39.27 kg/m  , BMI Body mass index is 39.27 kg/m. GEN: Well nourished, well developed, in no acute distress  HEENT: normal  Neck: no JVD, carotid bruits, or masses Cardiac: RRR; no murmurs, rubs, or gallops,no edema  Respiratory:  clear to auscultation bilaterally, normal work of breathing GI: soft, nontender, nondistended, + BS MS: no deformity or atrophy  Skin: warm and dry, no rash Neuro:  Strength and sensation are intact Psych: euthymic mood, full affect    Recent Labs: 05/23/2018: NT-Pro BNP 948 04/08/2019: BUN 36; Creatinine, Ser 1.53; Hemoglobin 10.7; Platelets 197; Potassium 4.1; Sodium 135   Lipid Panel    Component Value Date/Time   CHOL 186 11/22/2017 1507   TRIG 279 (H) 11/22/2017 1507   HDL 48 (L) 11/22/2017 1507   CHOLHDL 3.9 11/22/2017 1507   LDLCALC 98 11/22/2017 1507     Other studies Reviewed: Additional studies/ records that were reviewed today with results demonstrating: .   ASSESSMENT AND PLAN:  1. NICM: Decreased LVEF at last check.  Medical therapy limited by CKD. 2. LBBB: s/p BiV AICD. Followed by Dr. Caryl Comes.  3. Atrial tachycardia:  4. CKD: Cr in 03/2019 was 2.1.  She has f/u with Dr. Royce Macadamia.  CKD has limited use of ACE-I, ARB, Entresto and aldactone.  Hydralazine would be a consideration with nitrates.  5. Hyperlipidemia: Nonobstructive CAD in 2019.  TO be checked with PMD at the end of the month.   6. DM: Increased A1C in early 2020.  7. CPAP used for OSA.   Current medicines are reviewed at length with the patient today.  The patient concerns regarding her medicines were addressed.  The following changes have been made:   No change  Labs/ tests ordered today include:  No orders of the defined types were placed in this encounter.   Recommend 150 minutes/week of aerobic exercise Low fat, low carb, high fiber diet recommended  Disposition:   FU in 6 months   Signed, Larae Grooms, MD  05/14/2019 4:49 PM    [facility] [place], [place], [place]  [address] Phone: [phone]; Fax: [phone]

## 2019-05-14 ENCOUNTER — Encounter: Payer: Self-pay | Admitting: Interventional Cardiology

## 2019-05-14 ENCOUNTER — Other Ambulatory Visit: Payer: Self-pay

## 2019-05-14 ENCOUNTER — Ambulatory Visit (INDEPENDENT_AMBULATORY_CARE_PROVIDER_SITE_OTHER): Payer: Medicare Other | Admitting: Interventional Cardiology

## 2019-05-14 VITALS — BP 124/66 | HR 94 | Ht 64.0 in | Wt 228.8 lb

## 2019-05-14 DIAGNOSIS — E782 Mixed hyperlipidemia: Secondary | ICD-10-CM | POA: Diagnosis not present

## 2019-05-14 DIAGNOSIS — Z95810 Presence of automatic (implantable) cardiac defibrillator: Secondary | ICD-10-CM | POA: Diagnosis not present

## 2019-05-14 DIAGNOSIS — I2510 Atherosclerotic heart disease of native coronary artery without angina pectoris: Secondary | ICD-10-CM | POA: Diagnosis not present

## 2019-05-14 DIAGNOSIS — I471 Supraventricular tachycardia: Secondary | ICD-10-CM

## 2019-05-14 DIAGNOSIS — I447 Left bundle-branch block, unspecified: Secondary | ICD-10-CM

## 2019-05-14 DIAGNOSIS — I428 Other cardiomyopathies: Secondary | ICD-10-CM | POA: Diagnosis not present

## 2019-05-14 DIAGNOSIS — E1165 Type 2 diabetes mellitus with hyperglycemia: Secondary | ICD-10-CM | POA: Diagnosis not present

## 2019-05-14 DIAGNOSIS — Z794 Long term (current) use of insulin: Secondary | ICD-10-CM

## 2019-05-14 DIAGNOSIS — N1832 Chronic kidney disease, stage 3b: Secondary | ICD-10-CM | POA: Diagnosis not present

## 2019-05-14 NOTE — Patient Instructions (Signed)
Medication Instructions:  Your physician recommends that you continue on your current medications as directed. Please refer to the Current Medication list given to you today.  *If you need a refill on your cardiac medications before your next appointment, please call your pharmacy*  Lab Work: None ordered If you have labs (blood work) drawn today and your tests are completely normal, you will receive your results only by: Marland Kitchen MyChart Message (if you have MyChart) OR . A paper copy in the mail If you have any lab test that is abnormal or we need to change your treatment, we will call you to review the results.  Testing/Procedures: None ordered  Follow-Up: At [facility], you and your health needs are our priority.  As part of our continuing mission to provide you with exceptional heart care, we have created designated Provider Care Teams.  These Care Teams include your primary Cardiologist (physician) and Advanced Practice Providers (APPs -  Physician Assistants and Nurse Practitioners) who all work together to provide you with the care you need, when you need it.  Your next appointment:   6 months  The format for your next appointment:   In Person  Provider:   You may see Larae Grooms, MD or one of the following Advanced Practice Providers on your designated Care Team:    Melina Copa, PA-C  Ermalinda Barrios, PA-C   Other Instructions  ]

## 2019-05-16 DIAGNOSIS — N183 Chronic kidney disease, stage 3 unspecified: Secondary | ICD-10-CM | POA: Diagnosis not present

## 2019-05-23 DIAGNOSIS — N1832 Chronic kidney disease, stage 3b: Secondary | ICD-10-CM | POA: Diagnosis not present

## 2019-05-23 DIAGNOSIS — E1122 Type 2 diabetes mellitus with diabetic chronic kidney disease: Secondary | ICD-10-CM | POA: Diagnosis not present

## 2019-05-23 DIAGNOSIS — E559 Vitamin D deficiency, unspecified: Secondary | ICD-10-CM | POA: Diagnosis not present

## 2019-05-23 DIAGNOSIS — I129 Hypertensive chronic kidney disease with stage 1 through stage 4 chronic kidney disease, or unspecified chronic kidney disease: Secondary | ICD-10-CM | POA: Diagnosis not present

## 2019-05-23 DIAGNOSIS — I5042 Chronic combined systolic (congestive) and diastolic (congestive) heart failure: Secondary | ICD-10-CM | POA: Diagnosis not present

## 2019-05-29 ENCOUNTER — Ambulatory Visit: Payer: Medicare Other

## 2019-05-30 ENCOUNTER — Other Ambulatory Visit: Payer: Self-pay

## 2019-05-30 ENCOUNTER — Ambulatory Visit
Admission: RE | Admit: 2019-05-30 | Discharge: 2019-05-30 | Disposition: A | Discharge status: Home / Self Care | Payer: Medicare Other | Source: Ambulatory Visit | Attending: Internal Medicine | Admitting: Internal Medicine

## 2019-05-30 DIAGNOSIS — Z1231 Encounter for screening mammogram for malignant neoplasm of breast: Secondary | ICD-10-CM

## 2019-06-07 DIAGNOSIS — N1832 Chronic kidney disease, stage 3b: Secondary | ICD-10-CM | POA: Diagnosis not present

## 2019-06-28 ENCOUNTER — Ambulatory Visit (INDEPENDENT_AMBULATORY_CARE_PROVIDER_SITE_OTHER): Payer: Medicare Other | Admitting: *Deleted

## 2019-06-28 DIAGNOSIS — I428 Other cardiomyopathies: Secondary | ICD-10-CM | POA: Diagnosis not present

## 2019-06-29 LAB — CUP PACEART REMOTE DEVICE CHECK
Date Time Interrogation Session: 20201204062627
Implantable Lead Implant Date: 20191129
Implantable Lead Implant Date: 20191129
Implantable Lead Implant Date: 20191129
Implantable Lead Location: 753858
Implantable Lead Location: 753859
Implantable Lead Location: 753860
Implantable Lead Model: 293
Implantable Lead Model: 4086
Implantable Lead Serial Number: 226691
Implantable Lead Serial Number: 440868
Implantable Pulse Generator Implant Date: 20191129
Pulse Gen Serial Number: 227627

## 2019-07-02 ENCOUNTER — Other Ambulatory Visit: Payer: Self-pay

## 2019-07-02 ENCOUNTER — Encounter: Payer: Self-pay | Admitting: Internal Medicine

## 2019-07-02 ENCOUNTER — Ambulatory Visit (INDEPENDENT_AMBULATORY_CARE_PROVIDER_SITE_OTHER): Payer: Medicare Other | Admitting: Internal Medicine

## 2019-07-02 VITALS — BP 128/78 | HR 79 | Ht 64.0 in | Wt 232.4 lb

## 2019-07-02 DIAGNOSIS — I428 Other cardiomyopathies: Secondary | ICD-10-CM

## 2019-07-02 DIAGNOSIS — I447 Left bundle-branch block, unspecified: Secondary | ICD-10-CM | POA: Diagnosis not present

## 2019-07-02 DIAGNOSIS — I471 Supraventricular tachycardia: Secondary | ICD-10-CM

## 2019-07-02 DIAGNOSIS — I2510 Atherosclerotic heart disease of native coronary artery without angina pectoris: Secondary | ICD-10-CM

## 2019-07-02 DIAGNOSIS — Z95810 Presence of automatic (implantable) cardiac defibrillator: Secondary | ICD-10-CM | POA: Diagnosis not present

## 2019-07-02 NOTE — Patient Instructions (Signed)
Medication Instructions:  Your physician recommends that you continue on your current medications as directed. Please refer to the Current Medication list given to you today.   Labwork: None ordered.   Testing/Procedures: None ordered.   Follow-Up: Follow up with Dr Caryl Comes in one year  Any Other Special Instructions Will Be Listed Below (If Applicable).     If you need a refill on your cardiac medications before your next appointment, please call your pharmacy.

## 2019-07-02 NOTE — Progress Notes (Signed)
Patient Care Team: Crist Infante, MD as PCP - General (Internal Medicine) Jettie Booze, MD as PCP - Cardiology (Cardiology) Phineas Inches, MD as Consulting Physician (Nephrology) Judeth Horn, MD as Consulting Physician (General Surgery)   HPI  Crystal [name] is a 68 y.o. female Seen following CRT upgrade and revision 11/19 for nonischemic cardiomyopathy left bundle branch block and congestive heart failure.  Previously implanted ICD lead had a threshold of greater than 5.  A new lead was implanted; the previously implanted epicardial LV lead also had exuberant threshold and an LV lead was placed   Multiple ECGs were reviewed from 2017--2019.  Even with rates as low as 75 left bundle branch block was present ECG 10/19 demonstrated recurrent left bundle branch block  The patient denies chest pain,, nocturnal dyspnea, orthopnea or peripheral edema.  There have been no palpitations, lightheadedness or syncope has moderate dyspnea on exertion.  Exertion also largely limited by back pain and arthritis  Tolerating medications.  Weight continues to go up.    DATE TEST EF   2005 Echo   25 %   2005 LHC  No obstructive CAD  10/15 Echo  50-55%   2018 Echo  50%   11/19 Echo  25-30%   11/19 LHC  No obstructive CAD   She was seen by Dr. Saundra Shelling 10/19 with symptoms of worsening heart failure.  Echocardiogram demonstrated EF as above with subsequent catheterization.   Date Cr K Hgb  12/19 1.79 4.5 10.7   11/20 1.8 431 12.0    Shortness of breath is much improved following CRT implant.  However, of late she has had more problems with dyspnea again.  No significant edema.  No chest pain.  No palpitations.  CPAP wearing  And monitored also supplemental nocturnal oxygen   Records and Results Reviewed  Past Medical History:  Diagnosis Date   AICD (automatic cardioverter/defibrillator) present    high RV threshold chronically, device was turned off in 2014; Device  battery has been dead x 7 years; "turned it back on 06/22/2018"   Anemia, iron deficiency    Angioedema    felt to likely be due to ace inhibitors but says she has had this even off of medicines, appears to be tolerating ARBs chronically   Anxiety    Arthritis    "hands, legs, arms; bad in my back" (06/22/2018)   Asthmatic bronchitis with status asthmaticus    Bradycardia    CHF (congestive heart failure) (HCC)    Chronic lower back pain    Chronic pain    Chronic renal insufficiency    CKD (chronic kidney disease), stage III    Coronary artery disease    Mild nonobstructive (30% LAD, 30% RCA) by 09/01/16 cath at [facility]   Degenerative joint disease    Depression    Diabetes mellitus, type 2 ([place])    Diabetic peripheral neuropathy ([place])    Dizziness    Dyspnea on exertion    Family history of adverse reaction to anesthesia    Mother has nausea   GERD (gastroesophageal reflux disease)    Gout    Heart valve disorder    History of blood transfusion 1981; ~ 2005; 12/2016   "childbirth; defibrillator OR; colostomy OR"   History of mononucleosis 03/2014   Hyperlipidemia    Hypertension    Hypothyroid    Insomnia    Intervertebral disc degeneration    Ischemic cardiomyopathy    LBBB (  left bundle branch block)    Myocardial infarction [facility]) dx'd ~ 2005   Nonischemic cardiomyopathy ([place])    s/p ICD in 2005. EF has since recovered   Obesity    On home oxygen therapy    "2L at night" (06/22/2018)   OSA on CPAP    Pneumonia    "couple times; last time was 12/2016" (06/22/2018)   PONV (postoperative nausea and vomiting)    Presence of permanent cardiac pacemaker    [employer]   Swelling    Syncope    Systemic hypertension    Vitamin D deficiency     Past Surgical History:  Procedure Laterality Date   APPENDECTOMY  07/13/2017   BLADDER SUSPENSION  1990   "w/hysterectomy"   BOWEL RESECTION N/A 07/09/2018    Procedure: SMALL BOWEL RESECTION;  Surgeon: Judeth Horn, MD;  Location: [place];  Service: General;  Laterality: N/A;   CARDIAC CATHETERIZATION  2005   no obstructive CAD per patient   CARDIAC CATHETERIZATION  2018   CARDIAC DEFIBRILLATOR PLACEMENT  2005   BiV ICD implanted,  LV lead is an epicardial lead   CARPAL TUNNEL RELEASE Left 07/25/2014   Dr.Williamson    CARPAL TUNNEL RELEASE Right 04/08/2019   Procedure: RIGHT CARPAL TUNNEL RELEASE ENDOSCOPIC;  Surgeon: Milly Jakob, MD;  Location: [place];  Service: Orthopedics;  Laterality: Right;   CATARACT EXTRACTION W/ INTRAOCULAR LENS  IMPLANT, BILATERAL Bilateral    COLONIC STENT PLACEMENT N/A 08/31/2017   Procedure: COLONIC STENT PLACEMENT;  Surgeon: Carol Ada, MD;  Location: [place];  Service: Endoscopy;  Laterality: N/A;   COLONOSCOPY     2010-2011 Dr.Kipreos    COLOSTOMY  12/2016   Archie Endo 01/26/2017   COLOSTOMY REVERSAL N/A 07/13/2017   Procedure: COLOSTOMY REVERSAL;  Surgeon: Judeth Horn, MD;  Location: [place];  Service: General;  Laterality: N/A;   CYST REMOVAL HAND Right 05/2013   thumb   DILATION AND CURETTAGE OF UTERUS  X 5-6   EXCISION MASS ABDOMINAL N/A 07/09/2018   Procedure: EXPLORATION OF  ABDOMINAL WOUND ERAS PATHWAY;  Surgeon: Judeth Horn, MD;  Location: [place];  Service: General;  Laterality: N/A;   FLEXIBLE SIGMOIDOSCOPY N/A 08/24/2017   Procedure: Beryle Quant;  Surgeon: Carol Ada, MD;  Location: [place];  Service: Endoscopy;  Laterality: N/A;   FLEXIBLE SIGMOIDOSCOPY N/A 08/31/2017   Procedure: FLEXIBLE SIGMOIDOSCOPY;  Surgeon: Carol Ada, MD;  Location: [place];  Service: Endoscopy;  Laterality: N/A;  stent placement   IMPLANTABLE CARDIOVERTER DEFIBRILLATOR GENERATOR CHANGE  2008   INSERTION OF MESH N/A 07/09/2018   Procedure: INSERTION OF VICRYL MESH;  Surgeon: Judeth Horn, MD;  Location: [place];  Service: General;  Laterality: N/A;   LAPAROTOMY N/A 09/18/2017    Procedure: EXPLORATORY LAPAROTOMY;  Surgeon: Judeth Horn, MD;  Location: [place];  Service: General;  Laterality: N/A;   LEAD REVISION  06/22/2018   LEAD REVISION/REPAIR N/A 06/22/2018   Procedure: LEAD REVISION/REPAIR;  Surgeon: Deboraha Sprang, MD;  Location: [place] [facility];  Service: Cardiovascular;  Laterality: N/A;   LYSIS OF ADHESION N/A 09/18/2017   Procedure: LYSIS OF ADHESION;  Surgeon: Judeth Horn, MD;  Location: [place];  Service: General;  Laterality: N/A;   LYSIS OF ADHESION N/A 07/09/2018   Procedure: LYSIS OF ADHESION;  Surgeon: Judeth Horn, MD;  Location: [place];  Service: General;  Laterality: N/A;   PARTIAL COLECTOMY N/A 09/18/2017   Procedure: ILEOCOLECTOMY;  Surgeon: Judeth Horn, MD;  Location[facility]  OR;  Service: General;  Laterality: N/A;   PARTIAL COLECTOMY  09/25/2017   RIGHT/LEFT HEART CATH AND CORONARY ANGIOGRAPHY N/A 06/01/2018   Procedure: RIGHT/LEFT HEART CATH AND CORONARY ANGIOGRAPHY;  Surgeon: Jettie Booze, MD;  Location: [place] [facility];  Service: Cardiovascular;  Laterality: N/A;   SIGMOIDOSCOPY N/A 09/18/2017   Procedure: SIGMOIDOSCOPY;  Surgeon: Judeth Horn, MD;  Location: [place];  Service: General;  Laterality: N/A;   SMALL INTESTINE SURGERY  07/09/2018   EXPLORATION OF ABDOMINAL WOUND ERAS PATHWAYN/; MESH; LYSIS OF ADHESIONS   TOTAL ABDOMINAL HYSTERECTOMY  1990   TRIGGER FINGER RELEASE Right 05/213   TRIGGER FINGER RELEASE Right 04/08/2019   Procedure: RIGHT INDEX RELEASE TRIGGER FINGER/A-1 PULLEY;  Surgeon: Milly Jakob, MD;  Location: [place];  Service: Orthopedics;  Laterality: Right;   TUBAL LIGATION  1980s   VESICOVAGINAL FISTULA CLOSURE W/ TAH      Current Meds  Medication Sig   acetaminophen (TYLENOL) 500 MG tablet Take 500 mg by mouth as needed.   albuterol (PROVENTIL HFA;VENTOLIN HFA) 108 (90 Base) MCG/ACT inhaler Inhale 2 puffs into the lungs every 6 (six) hours as needed for wheezing or shortness of breath.     allopurinol (ZYLOPRIM) 100 MG tablet TAKE 1 TABLET(100 MG) BY MOUTH DAILY   Calcium Carbonate-Vitamin D (CALTRATE 600+D) 600-400 MG-UNIT tablet Take 1 tablet by mouth 2 (two) times daily.   carvedilol (COREG) 25 MG tablet Take 25 mg by mouth 2 (two) times daily with a meal.   CRESTOR 10 MG tablet Take 1 tablet (10 mg total) by mouth at bedtime.   DULoxetine (CYMBALTA) 30 MG capsule Take 1 capsule (30 mg total) by mouth daily.   gabapentin (NEURONTIN) 300 MG capsule Take 300 mg by mouth at bedtime.   insulin regular human CONCENTRATED (HUMULIN R U-500 KWIKPEN) 500 UNIT/ML kwikpen Inject 120 Units into the skin 3 (three) times daily with meals. 100 units at bedtime   levothyroxine (SYNTHROID, LEVOTHROID) 88 MCG tablet Take 88 mcg by mouth daily before breakfast.    loratadine (CLARITIN) 10 MG tablet Take 10 mg by mouth daily.   montelukast (SINGULAIR) 10 MG tablet Take 10 mg by mouth at bedtime.   omega-3 acid ethyl esters (LOVAZA) 1 g capsule Take 4 g by mouth at bedtime.    pantoprazole (PROTONIX) 40 MG tablet Take 40 mg by mouth every evening.   temazepam (RESTORIL) 15 MG capsule Take 1 capsule (15 mg total) by mouth at bedtime as needed for sleep.   torsemide (DEMADEX) 20 MG tablet Take 30 mg by mouth daily.   traMADol (ULTRAM) 50 MG tablet Take 1 tablet (50 mg total) by mouth every 6 (six) hours as needed for moderate pain or severe pain.    Allergies  Allergen Reactions   Ace Inhibitors Swelling and Other (See Comments)    Angioedema   Glucophage [Metformin Hcl] Other (See Comments)    Renal failure   Telmisartan Other (See Comments)    AVOID ARB/ ACEi in this patient due to recurrent AKI   Advicor [Niacin-Lovastatin Er] Other (See Comments)    Muscle aches   Bystolic [Nebivolol Hcl] Swelling and Other (See Comments)    UNSPECIFIED SWELLING/EDEMA   Erythromycin Diarrhea, Nausea And Vomiting and Other (See Comments)    *DERIVATIVES*   Lipitor [Atorvastatin]  Other (See Comments)    Muscle aches   Lopid [Gemfibrozil] Other (See Comments)    Muscle aches   Statins Other (See Comments)    Muscle  aches tolerates crestor      Review of Systems negative except from HPI and PMH  Physical Exam BP 128/78    Pulse 79    Ht 5\' 4"  (1.626 m)    Wt 232 lb 6.4 oz (105.4 kg)    SpO2 97%    BMI 39.89 kg/m  Well developed and Morbidly obese in no acute distress HENT normal Neck supple with JVP  Clear Regular rate and rhythm, no murmurs or gallops Abd-soft with active BS No Clubbing cyanosis edema Skin-warm and dry A & Oriented  Grossly normal sensory and motor function    ECG demonstrates sinus rhythm with P synchronous pacing with an upright QRS lead V1 and a qR in lead I  Chest Xray personally reviewed  LV lead poles 1,2,3 are all on lateral wall   Assessment and Plan:  Nonischemic cardiomyopathy-recurrent  Left bundle branch block-recurrent  Congestive heart failure- chronic systolic  Morbid obesity  Atrial tachycardia recurrent on 12/12 and 12/14    CRT-D-[employer]- RV lead failure newly implanted RV lead and LV lead  Elevated pacing thresholds were noted on the LV lead.  The patient's device was interrogated and the information was fully reviewed.  The device was reprogrammed to cover the LV threshold.     Renal insufficiency grade 3  ACE inhibitor allergy-angioedema  Anemia  Atrial tachycardia was initially detected as atrial in origin and therapy withheld by RID; however, at the end it failed--this feature is not modifiable.  No interval arrhythmia  Euvolemic continue current meds  Encouraged diet and exercise, limited in large part by arthritis and back pain  Suggested CUBII  And water aerobics  We spent more than 50% of our >25 min visit in face to face counseling regarding the above    Virl Axe

## 2019-07-15 LAB — CUP PACEART INCLINIC DEVICE CHECK
Brady Statistic RA Percent Paced: 1 %
Brady Statistic RV Percent Paced: 99 %
Date Time Interrogation Session: 20201208092419
HighPow Impedance: 76 Ohm
Implantable Lead Implant Date: 20191129
Implantable Lead Implant Date: 20191129
Implantable Lead Implant Date: 20191129
Implantable Lead Location: 753858
Implantable Lead Location: 753859
Implantable Lead Location: 753860
Implantable Lead Model: 293
Implantable Lead Model: 4086
Implantable Lead Serial Number: 226691
Implantable Lead Serial Number: 440868
Implantable Pulse Generator Implant Date: 20191129
Lead Channel Impedance Value: 500 Ohm
Lead Channel Impedance Value: 688 Ohm
Lead Channel Impedance Value: 882 Ohm
Lead Channel Pacing Threshold Amplitude: 0.7 V
Lead Channel Pacing Threshold Amplitude: 1.5 V
Lead Channel Pacing Threshold Amplitude: 2.4 V
Lead Channel Pacing Threshold Pulse Width: 0.4 ms
Lead Channel Pacing Threshold Pulse Width: 0.4 ms
Lead Channel Pacing Threshold Pulse Width: 1 ms
Lead Channel Sensing Intrinsic Amplitude: 16.7 mV
Lead Channel Sensing Intrinsic Amplitude: 25 mV
Lead Channel Sensing Intrinsic Amplitude: 3.1 mV
Lead Channel Setting Pacing Amplitude: 2 V
Lead Channel Setting Pacing Amplitude: 2.5 V
Lead Channel Setting Pacing Amplitude: 3 V
Lead Channel Setting Pacing Pulse Width: 0.4 ms
Lead Channel Setting Pacing Pulse Width: 1 ms
Lead Channel Setting Sensing Sensitivity: 0.5 mV
Lead Channel Setting Sensing Sensitivity: 1 mV
Pulse Gen Serial Number: 227627

## 2019-08-07 ENCOUNTER — Encounter (HOSPITAL_COMMUNITY): Payer: Self-pay

## 2019-08-07 ENCOUNTER — Other Ambulatory Visit: Payer: Self-pay

## 2019-08-07 ENCOUNTER — Inpatient Hospital Stay (HOSPITAL_COMMUNITY)
Admission: EM | Admit: 2019-08-07 | Discharge: 2019-08-10 | DRG: 177 | Disposition: A | Discharge status: Home / Self Care | Payer: Medicare Other | Source: Ambulatory Visit | Attending: Internal Medicine | Admitting: Internal Medicine

## 2019-08-07 ENCOUNTER — Emergency Department (HOSPITAL_COMMUNITY): Payer: Medicare Other

## 2019-08-07 DIAGNOSIS — N179 Acute kidney failure, unspecified: Secondary | ICD-10-CM | POA: Diagnosis not present

## 2019-08-07 DIAGNOSIS — M109 Gout, unspecified: Secondary | ICD-10-CM | POA: Diagnosis present

## 2019-08-07 DIAGNOSIS — J440 Chronic obstructive pulmonary disease with acute lower respiratory infection: Secondary | ICD-10-CM | POA: Diagnosis present

## 2019-08-07 DIAGNOSIS — E1121 Type 2 diabetes mellitus with diabetic nephropathy: Secondary | ICD-10-CM

## 2019-08-07 DIAGNOSIS — I509 Heart failure, unspecified: Secondary | ICD-10-CM | POA: Diagnosis not present

## 2019-08-07 DIAGNOSIS — Z95810 Presence of automatic (implantable) cardiac defibrillator: Secondary | ICD-10-CM | POA: Diagnosis not present

## 2019-08-07 DIAGNOSIS — D649 Anemia, unspecified: Secondary | ICD-10-CM | POA: Diagnosis not present

## 2019-08-07 DIAGNOSIS — E785 Hyperlipidemia, unspecified: Secondary | ICD-10-CM | POA: Diagnosis present

## 2019-08-07 DIAGNOSIS — E1142 Type 2 diabetes mellitus with diabetic polyneuropathy: Secondary | ICD-10-CM | POA: Diagnosis present

## 2019-08-07 DIAGNOSIS — Z881 Allergy status to other antibiotic agents status: Secondary | ICD-10-CM

## 2019-08-07 DIAGNOSIS — Z825 Family history of asthma and other chronic lower respiratory diseases: Secondary | ICD-10-CM | POA: Diagnosis not present

## 2019-08-07 DIAGNOSIS — U071 COVID-19: Secondary | ICD-10-CM | POA: Diagnosis present

## 2019-08-07 DIAGNOSIS — E039 Hypothyroidism, unspecified: Secondary | ICD-10-CM | POA: Diagnosis present

## 2019-08-07 DIAGNOSIS — I130 Hypertensive heart and chronic kidney disease with heart failure and stage 1 through stage 4 chronic kidney disease, or unspecified chronic kidney disease: Secondary | ICD-10-CM | POA: Diagnosis present

## 2019-08-07 DIAGNOSIS — I447 Left bundle-branch block, unspecified: Secondary | ICD-10-CM | POA: Diagnosis present

## 2019-08-07 DIAGNOSIS — N1832 Chronic kidney disease, stage 3b: Secondary | ICD-10-CM | POA: Diagnosis present

## 2019-08-07 DIAGNOSIS — F4321 Adjustment disorder with depressed mood: Secondary | ICD-10-CM | POA: Diagnosis not present

## 2019-08-07 DIAGNOSIS — Z8249 Family history of ischemic heart disease and other diseases of the circulatory system: Secondary | ICD-10-CM | POA: Diagnosis not present

## 2019-08-07 DIAGNOSIS — R0901 Asphyxia: Secondary | ICD-10-CM | POA: Diagnosis not present

## 2019-08-07 DIAGNOSIS — I252 Old myocardial infarction: Secondary | ICD-10-CM

## 2019-08-07 DIAGNOSIS — J1282 Pneumonia due to coronavirus disease 2019: Secondary | ICD-10-CM | POA: Diagnosis present

## 2019-08-07 DIAGNOSIS — R112 Nausea with vomiting, unspecified: Secondary | ICD-10-CM | POA: Diagnosis present

## 2019-08-07 DIAGNOSIS — I2510 Atherosclerotic heart disease of native coronary artery without angina pectoris: Secondary | ICD-10-CM | POA: Diagnosis present

## 2019-08-07 DIAGNOSIS — G4733 Obstructive sleep apnea (adult) (pediatric): Secondary | ICD-10-CM | POA: Diagnosis present

## 2019-08-07 DIAGNOSIS — K219 Gastro-esophageal reflux disease without esophagitis: Secondary | ICD-10-CM | POA: Diagnosis present

## 2019-08-07 DIAGNOSIS — Z6838 Body mass index (BMI) 38.0-38.9, adult: Secondary | ICD-10-CM

## 2019-08-07 DIAGNOSIS — R509 Fever, unspecified: Secondary | ICD-10-CM | POA: Diagnosis not present

## 2019-08-07 DIAGNOSIS — E875 Hyperkalemia: Secondary | ICD-10-CM | POA: Diagnosis not present

## 2019-08-07 DIAGNOSIS — J9601 Acute respiratory failure with hypoxia: Secondary | ICD-10-CM | POA: Diagnosis present

## 2019-08-07 DIAGNOSIS — E1159 Type 2 diabetes mellitus with other circulatory complications: Secondary | ICD-10-CM | POA: Diagnosis not present

## 2019-08-07 DIAGNOSIS — E1165 Type 2 diabetes mellitus with hyperglycemia: Secondary | ICD-10-CM | POA: Diagnosis not present

## 2019-08-07 DIAGNOSIS — Z9981 Dependence on supplemental oxygen: Secondary | ICD-10-CM | POA: Diagnosis not present

## 2019-08-07 DIAGNOSIS — Z833 Family history of diabetes mellitus: Secondary | ICD-10-CM | POA: Diagnosis not present

## 2019-08-07 DIAGNOSIS — N184 Chronic kidney disease, stage 4 (severe): Secondary | ICD-10-CM | POA: Diagnosis not present

## 2019-08-07 DIAGNOSIS — Z794 Long term (current) use of insulin: Secondary | ICD-10-CM

## 2019-08-07 DIAGNOSIS — R0602 Shortness of breath: Secondary | ICD-10-CM | POA: Diagnosis not present

## 2019-08-07 DIAGNOSIS — I255 Ischemic cardiomyopathy: Secondary | ICD-10-CM | POA: Diagnosis present

## 2019-08-07 DIAGNOSIS — I5022 Chronic systolic (congestive) heart failure: Secondary | ICD-10-CM | POA: Diagnosis present

## 2019-08-07 DIAGNOSIS — Z818 Family history of other mental and behavioral disorders: Secondary | ICD-10-CM

## 2019-08-07 DIAGNOSIS — Z803 Family history of malignant neoplasm of breast: Secondary | ICD-10-CM

## 2019-08-07 DIAGNOSIS — Z79890 Hormone replacement therapy (postmenopausal): Secondary | ICD-10-CM

## 2019-08-07 DIAGNOSIS — M159 Polyosteoarthritis, unspecified: Secondary | ICD-10-CM | POA: Diagnosis present

## 2019-08-07 DIAGNOSIS — R531 Weakness: Secondary | ICD-10-CM | POA: Diagnosis not present

## 2019-08-07 DIAGNOSIS — E1122 Type 2 diabetes mellitus with diabetic chronic kidney disease: Secondary | ICD-10-CM

## 2019-08-07 DIAGNOSIS — Z888 Allergy status to other drugs, medicaments and biological substances status: Secondary | ICD-10-CM

## 2019-08-07 DIAGNOSIS — N183 Chronic kidney disease, stage 3 unspecified: Secondary | ICD-10-CM | POA: Diagnosis present

## 2019-08-07 LAB — CBC WITH DIFFERENTIAL/PLATELET
Abs Immature Granulocytes: 0.13 10*3/uL — ABNORMAL HIGH (ref 0.00–0.07)
Basophils Absolute: 0 10*3/uL (ref 0.0–0.1)
Basophils Relative: 0 %
Eosinophils Absolute: 0 10*3/uL (ref 0.0–0.5)
Eosinophils Relative: 0 %
HCT: 32.6 % — ABNORMAL LOW (ref 36.0–46.0)
Hemoglobin: 9.9 g/dL — ABNORMAL LOW (ref 12.0–15.0)
Immature Granulocytes: 2 %
Lymphocytes Relative: 18 %
Lymphs Abs: 1.4 10*3/uL (ref 0.7–4.0)
MCH: 25.8 pg — ABNORMAL LOW (ref 26.0–34.0)
MCHC: 30.4 g/dL (ref 30.0–36.0)
MCV: 85.1 fL (ref 80.0–100.0)
Monocytes Absolute: 0.6 10*3/uL (ref 0.1–1.0)
Monocytes Relative: 8 %
Neutro Abs: 5.5 10*3/uL (ref 1.7–7.7)
Neutrophils Relative %: 72 %
Platelets: 190 10*3/uL (ref 150–400)
RBC: 3.83 MIL/uL — ABNORMAL LOW (ref 3.87–5.11)
RDW: 16.6 % — ABNORMAL HIGH (ref 11.5–15.5)
WBC: 7.6 10*3/uL (ref 4.0–10.5)
nRBC: 0 % (ref 0.0–0.2)

## 2019-08-07 LAB — COMPREHENSIVE METABOLIC PANEL
ALT: 30 U/L (ref 0–44)
AST: 29 U/L (ref 15–41)
Albumin: 3.4 g/dL — ABNORMAL LOW (ref 3.5–5.0)
Alkaline Phosphatase: 203 U/L — ABNORMAL HIGH (ref 38–126)
Anion gap: 9 (ref 5–15)
BUN: 61 mg/dL — ABNORMAL HIGH (ref 8–23)
CO2: 22 mmol/L (ref 22–32)
Calcium: 8.7 mg/dL — ABNORMAL LOW (ref 8.9–10.3)
Chloride: 109 mmol/L (ref 98–111)
Creatinine, Ser: 2.22 mg/dL — ABNORMAL HIGH (ref 0.44–1.00)
GFR calc Af Amer: 25 mL/min — ABNORMAL LOW (ref >60)
GFR calc non Af Amer: 22 mL/min — ABNORMAL LOW (ref >60)
Glucose, Bld: 231 mg/dL — ABNORMAL HIGH (ref 70–99)
Potassium: 5.3 mmol/L — ABNORMAL HIGH (ref 3.5–5.1)
Sodium: 140 mmol/L (ref 135–145)
Total Bilirubin: 0.7 mg/dL (ref 0.3–1.2)
Total Protein: 7.8 g/dL (ref 6.5–8.1)

## 2019-08-07 LAB — D-DIMER, QUANTITATIVE: D-Dimer, Quant: 0.76 ug/mL-FEU — ABNORMAL HIGH (ref 0.00–0.50)

## 2019-08-07 LAB — PROCALCITONIN: Procalcitonin: 1 ng/mL

## 2019-08-07 LAB — BRAIN NATRIURETIC PEPTIDE: B Natriuretic Peptide: 50.2 pg/mL (ref 0.0–100.0)

## 2019-08-07 LAB — C-REACTIVE PROTEIN: CRP: 20.9 mg/dL — ABNORMAL HIGH (ref <1.0)

## 2019-08-07 LAB — FERRITIN: Ferritin: 142 ng/mL (ref 11–307)

## 2019-08-07 LAB — ABO/RH: ABO/RH(D): B POS

## 2019-08-07 LAB — CBG MONITORING, ED: Glucose-Capillary: 247 mg/dL — ABNORMAL HIGH (ref 70–99)

## 2019-08-07 LAB — FIBRINOGEN: Fibrinogen: 707 mg/dL — ABNORMAL HIGH (ref 210–475)

## 2019-08-07 LAB — LACTATE DEHYDROGENASE: LDH: 214 U/L — ABNORMAL HIGH (ref 98–192)

## 2019-08-07 MED ORDER — INSULIN ASPART 100 UNIT/ML ~~LOC~~ SOLN
10.0000 [IU] | Freq: Three times a day (TID) | SUBCUTANEOUS | Status: DC
  Administered 2019-08-08: 10 [IU] via SUBCUTANEOUS
  Filled 2019-08-07: qty 0.1

## 2019-08-07 MED ORDER — ONDANSETRON HCL 4 MG/2ML IJ SOLN: 4.0000 mg | Freq: Four times a day (QID) | INTRAMUSCULAR | Status: DC | PRN

## 2019-08-07 MED ORDER — LEVOTHYROXINE SODIUM 88 MCG PO TABS
88.0000 ug | ORAL_TABLET | Freq: Every day | ORAL | Status: DC
  Administered 2019-08-08 – 2019-08-10 (×3): 88 ug via ORAL
  Filled 2019-08-07 (×4): qty 1

## 2019-08-07 MED ORDER — ALLOPURINOL 100 MG PO TABS
100.0000 mg | ORAL_TABLET | Freq: Every day | ORAL | Status: DC
  Administered 2019-08-07 – 2019-08-10 (×4): 100 mg via ORAL
  Filled 2019-08-07 (×4): qty 1

## 2019-08-07 MED ORDER — HEPARIN SODIUM (PORCINE) 5000 UNIT/ML IJ SOLN
5000.0000 [IU] | Freq: Three times a day (TID) | INTRAMUSCULAR | Status: DC
  Administered 2019-08-07 – 2019-08-08 (×2): 5000 [IU] via SUBCUTANEOUS
  Filled 2019-08-07 (×2): qty 1

## 2019-08-07 MED ORDER — ALBUTEROL SULFATE HFA 108 (90 BASE) MCG/ACT IN AERS
2.0000 | INHALATION_SPRAY | Freq: Four times a day (QID) | RESPIRATORY_TRACT | Status: DC | PRN
  Filled 2019-08-07: qty 6.7

## 2019-08-07 MED ORDER — SODIUM CHLORIDE 0.9 % IV SOLN
200.0000 mg | Freq: Once | INTRAVENOUS | Status: AC
  Administered 2019-08-07: 23:00:00 200 mg via INTRAVENOUS
  Filled 2019-08-07: qty 200

## 2019-08-07 MED ORDER — DULOXETINE HCL 30 MG PO CPEP
30.0000 mg | ORAL_CAPSULE | Freq: Every day | ORAL | Status: DC
  Administered 2019-08-07 – 2019-08-10 (×4): 30 mg via ORAL
  Filled 2019-08-07 (×4): qty 1

## 2019-08-07 MED ORDER — INSULIN ASPART 100 UNIT/ML ~~LOC~~ SOLN
0.0000 [IU] | Freq: Every day | SUBCUTANEOUS | Status: DC
  Administered 2019-08-07: 2 [IU] via SUBCUTANEOUS
  Filled 2019-08-07: qty 0.05

## 2019-08-07 MED ORDER — GABAPENTIN 300 MG PO CAPS
300.0000 mg | ORAL_CAPSULE | Freq: Every day | ORAL | Status: DC
  Administered 2019-08-07 – 2019-08-09 (×3): 300 mg via ORAL
  Filled 2019-08-07 (×3): qty 1

## 2019-08-07 MED ORDER — INSULIN GLARGINE 100 UNIT/ML ~~LOC~~ SOLN
100.0000 [IU] | Freq: Two times a day (BID) | SUBCUTANEOUS | Status: DC
  Administered 2019-08-07 – 2019-08-08 (×2): 100 [IU] via SUBCUTANEOUS
  Filled 2019-08-07 (×4): qty 1

## 2019-08-07 MED ORDER — SODIUM CHLORIDE 0.9 % IV SOLN: 100.0000 mg | Freq: Every day | INTRAVENOUS | Status: DC

## 2019-08-07 MED ORDER — TRAMADOL HCL 50 MG PO TABS: 50.0000 mg | ORAL_TABLET | Freq: Four times a day (QID) | ORAL | Status: DC | PRN

## 2019-08-07 MED ORDER — ROSUVASTATIN CALCIUM 5 MG PO TABS
10.0000 mg | ORAL_TABLET | Freq: Every day | ORAL | Status: DC
  Administered 2019-08-07 – 2019-08-09 (×3): 10 mg via ORAL
  Filled 2019-08-07: qty 2
  Filled 2019-08-07: qty 1
  Filled 2019-08-07 (×2): qty 2
  Filled 2019-08-07: qty 1

## 2019-08-07 MED ORDER — SODIUM CHLORIDE 0.9 % IV SOLN
100.0000 mg | Freq: Every day | INTRAVENOUS | Status: DC
  Administered 2019-08-08 – 2019-08-10 (×3): 100 mg via INTRAVENOUS
  Filled 2019-08-07: qty 20
  Filled 2019-08-07: qty 100
  Filled 2019-08-07: qty 20

## 2019-08-07 MED ORDER — SODIUM CHLORIDE 0.9 % IV SOLN
200.0000 mg | Freq: Once | INTRAVENOUS | Status: DC
  Filled 2019-08-07: qty 40

## 2019-08-07 MED ORDER — DEXAMETHASONE SODIUM PHOSPHATE 10 MG/ML IJ SOLN
6.0000 mg | INTRAMUSCULAR | Status: DC
  Administered 2019-08-07 – 2019-08-09 (×3): 6 mg via INTRAVENOUS
  Filled 2019-08-07 (×3): qty 1

## 2019-08-07 MED ORDER — SODIUM CHLORIDE 0.9 % IV SOLN: 250.0000 mL | INTRAVENOUS | Status: DC | PRN

## 2019-08-07 MED ORDER — INSULIN ASPART 100 UNIT/ML ~~LOC~~ SOLN
0.0000 [IU] | Freq: Three times a day (TID) | SUBCUTANEOUS | Status: DC
  Administered 2019-08-08: 15 [IU] via SUBCUTANEOUS
  Administered 2019-08-08: 10 [IU] via SUBCUTANEOUS
  Filled 2019-08-07: qty 0.15

## 2019-08-07 MED ORDER — SODIUM CHLORIDE 0.9% FLUSH
3.0000 mL | Freq: Two times a day (BID) | INTRAVENOUS | Status: DC
  Administered 2019-08-07: 23:00:00 3 mL via INTRAVENOUS

## 2019-08-07 MED ORDER — PANTOPRAZOLE SODIUM 40 MG PO TBEC
40.0000 mg | DELAYED_RELEASE_TABLET | Freq: Every evening | ORAL | Status: DC
  Administered 2019-08-08 – 2019-08-09 (×2): 40 mg via ORAL
  Filled 2019-08-07 (×2): qty 1

## 2019-08-07 MED ORDER — ONDANSETRON HCL 4 MG PO TABS: 4.0000 mg | ORAL_TABLET | Freq: Four times a day (QID) | ORAL | Status: DC | PRN

## 2019-08-07 MED ORDER — MONTELUKAST SODIUM 10 MG PO TABS
10.0000 mg | ORAL_TABLET | Freq: Every day | ORAL | Status: DC
  Administered 2019-08-07 – 2019-08-09 (×3): 10 mg via ORAL
  Filled 2019-08-07 (×4): qty 1

## 2019-08-07 MED ORDER — CARVEDILOL 12.5 MG PO TABS
25.0000 mg | ORAL_TABLET | Freq: Two times a day (BID) | ORAL | Status: DC
  Administered 2019-08-07 – 2019-08-10 (×6): 25 mg via ORAL
  Filled 2019-08-07 (×2): qty 1
  Filled 2019-08-07: qty 2
  Filled 2019-08-07: qty 1
  Filled 2019-08-07 (×3): qty 2

## 2019-08-07 MED ORDER — ACETAMINOPHEN 325 MG PO TABS: 650.0000 mg | ORAL_TABLET | Freq: Four times a day (QID) | ORAL | Status: DC | PRN

## 2019-08-07 MED ORDER — SODIUM CHLORIDE 0.9% FLUSH: 3.0000 mL | INTRAVENOUS | Status: DC | PRN

## 2019-08-07 MED ORDER — TEMAZEPAM 15 MG PO CAPS
15.0000 mg | ORAL_CAPSULE | Freq: Every evening | ORAL | Status: DC | PRN
  Administered 2019-08-08: 22:00:00 15 mg via ORAL
  Filled 2019-08-07: qty 1

## 2019-08-07 NOTE — H&P (Addendum)
History and Physical    BAY JARQUIN VZD:[id number] DOB: 1951-03-12 DOA: 08/07/2019  PCP: Crist Infante, MD   Patient coming from: Home   Chief Complaint: SOB, cough, fevers, N/V, anorexia   HPI: Crystal [name] is a 69 y.o. female with medical history significant for nonischemic cardiomyopathy with CRT-D, insulin-dependent diabetes mellitus, chronic kidney disease stage IIIb, hypothyroidism, depression, insomnia, and chronic pain, now presenting to the emergency department with a week of shortness of breath, cough, fevers, malaise, nausea, vomiting, and anorexia.  The patient's daughter tested positive for COVID-19 several days ago and the patient had presumed that she was also infected.  With progressing symptoms, she was seen in an outpatient clinic today where she was confirmed to be positive for COVID-19 (has paper copy of results with her from [facility]) and was hypoxic in the mid 80s on room air, and therefore directed to the ED. She denies chest pain or leg swelling, reports loose stools have improved, and denies abdominal pain.    ED Course: Upon arrival to the ED, patient is found to be febrile to 38.8 C, saturating low 90s on 2 L/min of supplemental oxygen, tachypneic, slightly tachycardic, and with stable blood pressure.  EKG features a paced rhythm with PVCs and chest x-ray notable for mild vascular congestion without edema.  Chemistry panel features a BUN of 61 and creatinine of 2.22, up from 1.53 in September.  CBC with chronic anemia, hemoglobin 9.9.  BNP was normal.  Patient was started on supplemental oxygen in the emergency department and hospitalists consulted for admission.  Review of Systems:  All other systems reviewed and apart from HPI, are negative.  Past Medical History:  Diagnosis Date  . AICD (automatic cardioverter/defibrillator) present    high RV threshold chronically, device was turned off in 2014; Device battery has been dead x 7 years; "turned  it back on 06/22/2018"  . Anemia, iron deficiency   . Angioedema    felt to likely be due to ace inhibitors but says she has had this even off of medicines, appears to be tolerating ARBs chronically  . Anxiety   . Arthritis    "hands, legs, arms; bad in my back" (06/22/2018)  . Asthmatic bronchitis with status asthmaticus   . Bradycardia   . CHF (congestive heart failure) ([place])   . Chronic lower back pain   . Chronic pain   . Chronic renal insufficiency   . CKD (chronic kidney disease), stage III   . Coronary artery disease    Mild nonobstructive (30% LAD, 30% RCA) by 09/01/16 cath at [facility]  . Degenerative joint disease   . Depression   . Diabetes mellitus, type 2 ([place])   . Diabetic peripheral neuropathy ([place])   . Dizziness   . Dyspnea on exertion   . Family history of adverse reaction to anesthesia    Mother has nausea  . GERD (gastroesophageal reflux disease)   . Gout   . Heart valve disorder   . History of blood transfusion 1981; ~ 2005; 12/2016   "childbirth; defibrillator OR; colostomy OR"  . History of mononucleosis 03/2014  . Hyperlipidemia   . Hypertension   . Hypothyroid   . Insomnia   . Intervertebral disc degeneration   . Ischemic cardiomyopathy   . LBBB (left bundle branch block)   . Myocardial infarction ([place]) dx'd ~ 2005  . Nonischemic cardiomyopathy ([place])    s/p ICD in 2005. EF has since recovered  . Obesity   .  On home oxygen therapy    "2L at night" (06/22/2018)  . OSA on CPAP   . Pneumonia    "couple times; last time was 12/2016" (06/22/2018)  . PONV (postoperative nausea and vomiting)   . Presence of permanent cardiac pacemaker    [employer]  . Swelling   . Syncope   . Systemic hypertension   . Vitamin D deficiency     Past Surgical History:  Procedure Laterality Date  . APPENDECTOMY  07/13/2017  . BLADDER SUSPENSION  1990   "w/hysterectomy"  . BOWEL RESECTION N/A 07/09/2018   Procedure: SMALL BOWEL RESECTION;  Surgeon:  Judeth Horn, MD;  Location: [place];  Service: General;  Laterality: N/A;  . CARDIAC CATHETERIZATION  2005   no obstructive CAD per patient  . CARDIAC CATHETERIZATION  2018  . CARDIAC DEFIBRILLATOR PLACEMENT  2005   BiV ICD implanted,  LV lead is an epicardial lead  . CARPAL TUNNEL RELEASE Left 07/25/2014   Dr.Williamson   . CARPAL TUNNEL RELEASE Right 04/08/2019   Procedure: RIGHT CARPAL TUNNEL RELEASE ENDOSCOPIC;  Surgeon: Milly Jakob, MD;  Location: [place];  Service: Orthopedics;  Laterality: Right;  . CATARACT EXTRACTION W/ INTRAOCULAR LENS  IMPLANT, BILATERAL Bilateral   . COLONIC STENT PLACEMENT N/A 08/31/2017   Procedure: COLONIC STENT PLACEMENT;  Surgeon: Carol Ada, MD;  Location: [place];  Service: Endoscopy;  Laterality: N/A;  . COLONOSCOPY     2010-2011 Dr.Kipreos   . COLOSTOMY  12/2016   Archie Endo 01/26/2017  . COLOSTOMY REVERSAL N/A 07/13/2017   Procedure: COLOSTOMY REVERSAL;  Surgeon: Judeth Horn, MD;  Location: [place];  Service: General;  Laterality: N/A;  . CYST REMOVAL HAND Right 05/2013   thumb  . DILATION AND CURETTAGE OF UTERUS  X 5-6  . EXCISION MASS ABDOMINAL N/A 07/09/2018   Procedure: EXPLORATION OF  ABDOMINAL WOUND ERAS PATHWAY;  Surgeon: Judeth Horn, MD;  Location: [place];  Service: General;  Laterality: N/A;  . FLEXIBLE SIGMOIDOSCOPY N/A 08/24/2017   Procedure: Beryle Quant;  Surgeon: Carol Ada, MD;  Location: [place];  Service: Endoscopy;  Laterality: N/A;  . FLEXIBLE SIGMOIDOSCOPY N/A 08/31/2017   Procedure: FLEXIBLE SIGMOIDOSCOPY;  Surgeon: Carol Ada, MD;  Location: [place];  Service: Endoscopy;  Laterality: N/A;  stent placement  . IMPLANTABLE CARDIOVERTER DEFIBRILLATOR GENERATOR CHANGE  2008  . INSERTION OF MESH N/A 07/09/2018   Procedure: INSERTION OF VICRYL MESH;  Surgeon: Judeth Horn, MD;  Location: [place];  Service: General;  Laterality: N/A;  . LAPAROTOMY N/A 09/18/2017   Procedure: EXPLORATORY LAPAROTOMY;  Surgeon:  Judeth Horn, MD;  Location: [place];  Service: General;  Laterality: N/A;  . LEAD REVISION  06/22/2018  . LEAD REVISION/REPAIR N/A 06/22/2018   Procedure: LEAD REVISION/REPAIR;  Surgeon: Deboraha Sprang, MD;  Location: [place] [facility];  Service: Cardiovascular;  Laterality: N/A;  . LYSIS OF ADHESION N/A 09/18/2017   Procedure: LYSIS OF ADHESION;  Surgeon: Judeth Horn, MD;  Location: [place];  Service: General;  Laterality: N/A;  . LYSIS OF ADHESION N/A 07/09/2018   Procedure: LYSIS OF ADHESION;  Surgeon: Judeth Horn, MD;  Location: [place];  Service: General;  Laterality: N/A;  . PARTIAL COLECTOMY N/A 09/18/2017   Procedure: ILEOCOLECTOMY;  Surgeon: Judeth Horn, MD;  Location: [place];  Service: General;  Laterality: N/A;  . PARTIAL COLECTOMY  09/25/2017  . RIGHT/LEFT HEART CATH AND CORONARY ANGIOGRAPHY N/A 06/01/2018   Procedure: RIGHT/LEFT HEART CATH AND CORONARY ANGIOGRAPHY;  Surgeon: Irish Lack,  Charlann Lange, MD;  Location: [place] [facility];  Service: Cardiovascular;  Laterality: N/A;  . SIGMOIDOSCOPY N/A 09/18/2017   Procedure: SIGMOIDOSCOPY;  Surgeon: Judeth Horn, MD;  Location: [place];  Service: General;  Laterality: N/A;  . SMALL INTESTINE SURGERY  07/09/2018   EXPLORATION OF ABDOMINAL WOUND ERAS PATHWAYN/; MESH; LYSIS OF ADHESIONS  . TOTAL ABDOMINAL HYSTERECTOMY  1990  . TRIGGER FINGER RELEASE Right 05/213  . TRIGGER FINGER RELEASE Right 04/08/2019   Procedure: RIGHT INDEX RELEASE TRIGGER FINGER/A-1 PULLEY;  Surgeon: Milly Jakob, MD;  Location: [place];  Service: Orthopedics;  Laterality: Right;  . TUBAL LIGATION  1980s  . VESICOVAGINAL FISTULA CLOSURE W/ TAH       reports that she has never smoked. She has never used smokeless tobacco. She reports that she does not drink alcohol or use drugs.  Allergies  Allergen Reactions  . Ace Inhibitors Swelling and Other (See Comments)    Angioedema  . Glucophage [Metformin Hcl] Other (See Comments)    Renal failure  . Telmisartan Other  (See Comments)    AVOID ARB/ ACEi in this patient due to recurrent AKI  . Advicor [Niacin-Lovastatin Er] Other (See Comments)    Muscle aches  . Bystolic [Nebivolol Hcl] Swelling and Other (See Comments)    UNSPECIFIED SWELLING/EDEMA  . Erythromycin Diarrhea, Nausea And Vomiting and Other (See Comments)    *DERIVATIVES*  . Lipitor [Atorvastatin] Other (See Comments)    Muscle aches  . Lopid [Gemfibrozil] Other (See Comments)    Muscle aches  . Statins Other (See Comments)    Muscle aches tolerates crestor    Family History  Problem Relation Age of Onset  . Hypertension Father   . Heart disease Father   . Cancer Father        prostate  . Heart disease Other        (Maternal side) Ischemic heart disease  . Diabetes Mellitus II Other   . Arrhythmia Mother   . Diabetes Mellitus II Mother        Borderline DM  . Hypertension Mother   . Asthma Mother   . Cancer Brother   . Dementia Brother   . Hypertension Brother   . Hypertension Daughter   . Hypertension Daughter   . Diabetes Daughter   . Hypertension Daughter   . Atrial fibrillation Daughter   . GER disease Daughter   . Hypertension Son   . Anxiety disorder Son   . Hypothyroidism Son   . Breast cancer Maternal Grandmother      Prior to Admission medications   Medication Sig Start Date End Date Taking? Authorizing Provider  acetaminophen (TYLENOL) 500 MG tablet Take 500 mg by mouth as needed.   Yes [provider]  albuterol (PROVENTIL HFA;VENTOLIN HFA) 108 (90 Base) MCG/ACT inhaler Inhale 2 puffs into the lungs every 6 (six) hours as needed for wheezing or shortness of breath.    Yes [provider]  allopurinol (ZYLOPRIM) 100 MG tablet TAKE 1 TABLET(100 MG) BY MOUTH DAILY Patient taking differently: Take 100 mg by mouth daily.  05/21/18  Yes Gildardo Cranker, DO  Calcium Carbonate-Vitamin D (CALTRATE 600+D) 600-400 MG-UNIT tablet Take 1 tablet by mouth 2 (two) times daily.   Yes [provider]  carvedilol (COREG) 25 MG tablet Take 25 mg by mouth 2 (two) times daily with a meal.   Yes [provider]  CRESTOR 10 MG tablet Take 1 tablet (10 mg total) by mouth at bedtime.  02/15/17  Yes Love, Ivan Anchors, PA-C  DULoxetine (CYMBALTA) 30 MG capsule Take 1 capsule (30 mg total) by mouth daily. 10/15/18  Yes Ngetich, Dinah C, NP  gabapentin (NEURONTIN) 300 MG capsule Take 300 mg by mouth at bedtime.   Yes [provider]  insulin regular human CONCENTRATED (HUMULIN R U-500 KWIKPEN) 500 UNIT/ML kwikpen Inject 100-120 Units into the skin See admin instructions. 120 units tid with meals and 100 units hs   Yes [provider]  levothyroxine (SYNTHROID, LEVOTHROID) 88 MCG tablet Take 88 mcg by mouth daily before breakfast.  10/16/15  Yes [provider]  loratadine (CLARITIN) 10 MG tablet Take 10 mg by mouth daily.   Yes [provider]  montelukast (SINGULAIR) 10 MG tablet Take 10 mg by mouth at bedtime.   Yes [provider]  omega-3 acid ethyl esters (LOVAZA) 1 g capsule Take 4 g by mouth at bedtime.    Yes [provider]  pantoprazole (PROTONIX) 40 MG tablet Take 40 mg by mouth every evening. 05/23/18  Yes [provider]  temazepam (RESTORIL) 15 MG capsule Take 1 capsule (15 mg total) by mouth at bedtime as needed for sleep. 05/07/18  Yes Eulas Post, Monica, DO  torsemide (DEMADEX) 20 MG tablet Take 30 mg by mouth daily.   Yes [provider]  traMADol (ULTRAM) 50 MG tablet Take 1 tablet (50 mg total) by mouth every 6 (six) hours as needed for moderate pain or severe pain. 07/15/18  Yes Judeth Horn, MD    Physical Exam: Vitals:   08/07/19 1830 08/07/19 1900 08/07/19 1914 08/07/19 1915  BP: (!) 122/56 (!) 124/50    Pulse: (!) 104 96 95 97  Resp: (!) 28 (!) 21 (!) 24 (!) 22  Temp:      TempSrc:      SpO2: 96% 93% 91% 94%  Weight:      Height:        Constitutional: NAD, calm  Eyes: PERTLA, lids and conjunctivae  normal ENMT: Mucous membranes are moist. Posterior pharynx clear of any exudate or lesions.   Neck: normal, supple, no masses, no thyromegaly Respiratory: Mild tachypnea while at rest, no wheezing, no crackles.  No pallor or cyanosis.  Cardiovascular: S1 & S2 heard, regular rate and rhythm. No extremity edema.   Abdomen: No distension, no tenderness, soft. Bowel sounds active.  Musculoskeletal: no clubbing / cyanosis. No joint deformity upper and lower extremities.  Skin: no significant rashes, lesions, ulcers. Warm, dry, well-perfused. Neurologic: no facial asymmetry. Sensation intact. Moving all extremities.  Psychiatric: Alert and answering questions appropriately. Pleasant, cooperative.    Labs on Admission: I have personally reviewed following labs and imaging studies  CBC: Recent Labs  Lab 08/07/19 1732  WBC 7.6  NEUTROABS 5.5  HGB 9.9*  HCT 32.6*  MCV 85.1  PLT 431   Basic Metabolic Panel: Recent Labs  Lab 08/07/19 1732  NA 140  K 5.3*  CL 109  CO2 22  GLUCOSE 231*  BUN 61*  CREATININE 2.22*  CALCIUM 8.7*   GFR: Estimated Creatinine Clearance: 28.8 mL/min (A) (by C-G formula based on SCr of 2.22 mg/dL (H)). Liver Function Tests: Recent Labs  Lab 08/07/19 1732  AST 29  ALT 30  ALKPHOS 203*  BILITOT 0.7  PROT 7.8  ALBUMIN 3.4*   No results for input(s): LIPASE, AMYLASE in the last 168 hours. No results for input(s): AMMONIA in the last 168 hours. Coagulation Profile: No results for input(s): INR,  PROTIME in the last 168 hours. Cardiac Enzymes: No results for input(s): CKTOTAL, CKMB, CKMBINDEX, TROPONINI in the last 168 hours. BNP (last 3 results) No results for input(s): PROBNP in the last 8760 hours. HbA1C: No results for input(s): HGBA1C in the last 72 hours. CBG: No results for input(s): GLUCAP in the last 168 hours. Lipid Profile: No results for input(s): CHOL, HDL, LDLCALC, TRIG, CHOLHDL, LDLDIRECT in the last 72 hours. Thyroid Function  Tests: No results for input(s): TSH, T4TOTAL, FREET4, T3FREE, THYROIDAB in the last 72 hours. Anemia Panel: No results for input(s): VITAMINB12, FOLATE, FERRITIN, TIBC, IRON, RETICCTPCT in the last 72 hours. Urine analysis:    Component Value Date/Time   COLORURINE YELLOW 07/12/2018 [place] 07/12/2018 1608   LABSPEC 1.010 07/12/2018 1608   PHURINE 6.0 07/12/2018 1608   GLUCOSEU NEGATIVE 07/12/2018 1608   HGBUR NEGATIVE 07/12/2018 1608   BILIRUBINUR NEGATIVE 07/12/2018 1608   KETONESUR NEGATIVE 07/12/2018 1608   PROTEINUR NEGATIVE 07/12/2018 1608   NITRITE NEGATIVE 07/12/2018 1608   LEUKOCYTESUR SMALL (A) 07/12/2018 1608   Sepsis Labs: @LABRCNTIP (procalcitonin:4,lacticidven:4) )No results found for this or any previous visit (from the past 240 hour(s)).   Radiological Exams on Admission: DG Chest Port 1 View  Result Date: 08/07/2019 CLINICAL DATA:  Short of breath EXAM: PORTABLE CHEST 1 VIEW COMPARISON:  07/12/2018 FINDINGS: Cardiac enlargement with mild vascular congestion. Negative for edema or effusion. Pacemaker leads unchanged with internal defibrillator. IMPRESSION: Mild vascular congestion without edema. Electronically Signed   By: Franchot Gallo M.D.   On: 08/07/2019 17:27    EKG: Independently reviewed. Paced, PVC's.   Assessment/Plan  1. Acute hypoxic respiratory failure secondary to COVID-19  - Presents with one week of fevers, cough, SOB, N/V, and anorexia; tested positive for COVID-19 (08/07/19; pt has printed copy of results) and was found to be saturating in mid-upper 80's on rm air while at rest  - Start Decadron and remdesivir, supplemental O2, check/trend markers    2. Chronic systolic CHF  - Appears compensated   - EF was 25-30% in March 2020 with diffuse LV HK, mild LVH, and mild MR  - She has CRT-D in place  - Hold diuretic initially in setting of recent N/V and anorexia with increased creatinine, SLIV, follow fluid-status, continue Coreg  as tolerated    3. Insulin-dependent DM  - A1c was 10.4% a year ago  - She uses very high-doses of insulin at home, will start with reduced dose to avoid hypoglycemia and adjust as needed    4. Acute kidney injury superimposed on CKD IIIb - SCr is 2.22 on admission, up from 1.53 in September 2020  - Likely acute prerenal azotemia in setting of recent N/V and anorexia  - Hold diuretic initially, renally-dose medications, repeat chem panel in am    5. Depression, insomnia  - Continue Cymbalta, Restoril   6. Hypothyroidism  - Continue Synthroid    7. Chronic pain  - Continue home regimen with Cymbalta, gabapentin, and prn Ultram  8. Asthma  - She has not been wheezing, continue Singulair and as-needed albuterol    DVT prophylaxis: sq heparin  Code Status: Full  Family Communication: Discussed with patient  Consults called: None  Admission status: Inpatient     Vianne Bulls, MD [facility] Pager [phone]  If 7PM-7AM, please contact night-coverage [email or web address] Password [facility]  08/07/2019, 7:58 PM

## 2019-08-07 NOTE — ED Provider Notes (Signed)
[facility] DEPT Provider Note   CSN: [id number] Arrival date & time: 08/07/19  1618     History Chief Complaint  Patient presents with  . COVID Positive  . Shortness of Breath  . Weakness    Crystal [name] is a 69 y.o. female.  HPI     This patient presents with 1 week of illness. She notes multiple medical issues, but states that she was generally well with about 1 week ago. About that time but she and her daughter developed similar illness, with weakness, cough, dyspnea, nausea, anorexia, vomiting, fever. The daughter was diagnosed with Covid about that time, the patient did not get tested due to assumed positive result. Over the interval week she has had persistent symptoms, not improved in spite of taking oral medication.  With worsening weakness, dyspnea, cough she went to her physician's office today.  She was diagnosed with coronavirus and sent here for evaluation. Per paper report from the office patient was hypoxic on room air 87%.  Past Medical History:  Diagnosis Date  . AICD (automatic cardioverter/defibrillator) present    high RV threshold chronically, device was turned off in 2014; Device battery has been dead x 7 years; "turned it back on 06/22/2018"  . Anemia, iron deficiency   . Angioedema    felt to likely be due to ace inhibitors but says she has had this even off of medicines, appears to be tolerating ARBs chronically  . Anxiety   . Arthritis    "hands, legs, arms; bad in my back" (06/22/2018)  . Asthmatic bronchitis with status asthmaticus   . Bradycardia   . CHF (congestive heart failure) ([place])   . Chronic lower back pain   . Chronic pain   . Chronic renal insufficiency   . CKD (chronic kidney disease), stage III   . Coronary artery disease    Mild nonobstructive (30% LAD, 30% RCA) by 09/01/16 cath at [facility]  . Degenerative joint disease   . Depression   . Diabetes mellitus, type 2 ([place])   . Diabetic  peripheral neuropathy ([place])   . Dizziness   . Dyspnea on exertion   . Family history of adverse reaction to anesthesia    Mother has nausea  . GERD (gastroesophageal reflux disease)   . Gout   . Heart valve disorder   . History of blood transfusion 1981; ~ 2005; 12/2016   "childbirth; defibrillator OR; colostomy OR"  . History of mononucleosis 03/2014  . Hyperlipidemia   . Hypertension   . Hypothyroid   . Insomnia   . Intervertebral disc degeneration   . Ischemic cardiomyopathy   . LBBB (left bundle branch block)   . Myocardial infarction ([place]) dx'd ~ 2005  . Nonischemic cardiomyopathy ([place])    s/p ICD in 2005. EF has since recovered  . Obesity   . On home oxygen therapy    "2L at night" (06/22/2018)  . OSA on CPAP   . Pneumonia    "couple times; last time was 12/2016" (06/22/2018)  . PONV (postoperative nausea and vomiting)   . Presence of permanent cardiac pacemaker    [employer]  . Swelling   . Syncope   . Systemic hypertension   . Vitamin D deficiency     Patient Active Problem List   Diagnosis Date Noted  . 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 Congestive heart failure (CHF) ([place]) 06/22/2018  . Acute systolic heart failure ([place])   . Type 2 diabetes mellitus  with stage 3 chronic kidney disease, with long-term current use of insulin ([place]) 11/22/2017  . Acute respiratory failure with hypoxia ([place]) 11/22/2017  . Bowel obstruction ([place]) 08/23/2017  . Colonic obstruction ([place]) 08/22/2017  . Dehydration   . Intractable vomiting with nausea   . Hyperkalemia   . Gastroenteritis 08/11/2017  . Hypotension 08/11/2017  . AKI (acute kidney injury) ([place]) 08/11/2017  . Hypovolemia 08/10/2017  . Colostomy in place [facility]) 07/13/2017  . Adjustment disorder with depressed mood   . Debilitated 02/06/2017  . S/P colostomy ([place]) 01/31/2017  . Pressure injury of skin 01/28/2017  . Colitis 01/26/2017  . Acute MI ([place]) 05/05/2014  . Anemia, iron deficiency 05/05/2014  .  Airway hyperreactivity 05/05/2014  . Diabetes mellitus, type 2 ([place]) 05/05/2014  . Chronic systolic heart failure ([place]) 05/05/2014  . HLD (hyperlipidemia) 05/05/2014  . Disease of thyroid gland 05/05/2014  . Nonischemic cardiomyopathy ([place]) 02/02/2014  . LBBB (left bundle branch block) 02/02/2014  . Chronic systolic dysfunction of left ventricle 02/02/2014  . Chronic renal insufficiency 02/02/2014  . Essential hypertension 02/02/2014  . Morbid obesity ([place]) 02/02/2014  . History of prolonged Q-T interval on ECG 10/31/2012  . Automatic implantable cardioverter-defibrillator in situ 08/06/2008    Past Surgical History:  Procedure Laterality Date  . APPENDECTOMY  07/13/2017  . BLADDER SUSPENSION  1990   "w/hysterectomy"  . BOWEL RESECTION N/A 07/09/2018   Procedure: SMALL BOWEL RESECTION;  Surgeon: Judeth Horn, MD;  Location: [facility];  Service: General;  Laterality: N/A;  . CARDIAC CATHETERIZATION  2005   no obstructive CAD per patient  . CARDIAC CATHETERIZATION  2018  . CARDIAC DEFIBRILLATOR PLACEMENT  2005   BiV ICD implanted,  LV lead is an epicardial lead  . CARPAL TUNNEL RELEASE Left 07/25/2014   Dr.Williamson   . CARPAL TUNNEL RELEASE Right 04/08/2019   Procedure: RIGHT CARPAL TUNNEL RELEASE ENDOSCOPIC;  Surgeon: Milly Jakob, MD;  Location: [facility];  Service: Orthopedics;  Laterality: Right;  . CATARACT EXTRACTION W/ INTRAOCULAR LENS  IMPLANT, BILATERAL Bilateral   . COLONIC STENT PLACEMENT N/A 08/31/2017   Procedure: COLONIC STENT PLACEMENT;  Surgeon: Carol Ada, MD;  Location: [facility];  Service: Endoscopy;  Laterality: N/A;  . COLONOSCOPY     2010-2011 Dr.Kipreos   . COLOSTOMY  12/2016   Archie Endo 01/26/2017  . COLOSTOMY REVERSAL N/A 07/13/2017   Procedure: COLOSTOMY REVERSAL;  Surgeon: Judeth Horn, MD;  Location: [facility];  Service: General;  Laterality: N/A;  . CYST REMOVAL HAND Right 05/2013   thumb  . DILATION AND CURETTAGE OF UTERUS  X 5-6  . EXCISION MASS  ABDOMINAL N/A 07/09/2018   Procedure: EXPLORATION OF  ABDOMINAL WOUND ERAS PATHWAY;  Surgeon: Judeth Horn, MD;  Location: [facility];  Service: General;  Laterality: N/A;  . FLEXIBLE SIGMOIDOSCOPY N/A 08/24/2017   Procedure: Beryle Quant;  Surgeon: Carol Ada, MD;  Location: [place];  Service: Endoscopy;  Laterality: N/A;  . FLEXIBLE SIGMOIDOSCOPY N/A 08/31/2017   Procedure: FLEXIBLE SIGMOIDOSCOPY;  Surgeon: Carol Ada, MD;  Location: [facility];  Service: Endoscopy;  Laterality: N/A;  stent placement  . IMPLANTABLE CARDIOVERTER DEFIBRILLATOR GENERATOR CHANGE  2008  . INSERTION OF MESH N/A 07/09/2018   Procedure: INSERTION OF VICRYL MESH;  Surgeon: Judeth Horn, MD;  Location: [facility];  Service: General;  Laterality: N/A;  . LAPAROTOMY N/A 09/18/2017   Procedure: EXPLORATORY LAPAROTOMY;  Surgeon: Judeth Horn, MD;  Location: [facility];  Service: General;  Laterality: N/A;  . LEAD REVISION  06/22/2018  . LEAD REVISION/REPAIR N/A 06/22/2018   Procedure: LEAD REVISION/REPAIR;  Surgeon: Deboraha Sprang, MD;  Location: [facility];  Service: Cardiovascular;  Laterality: N/A;  . LYSIS OF ADHESION N/A 09/18/2017   Procedure: LYSIS OF ADHESION;  Surgeon: Judeth Horn, MD;  Location: [facility];  Service: General;  Laterality: N/A;  . LYSIS OF ADHESION N/A 07/09/2018   Procedure: LYSIS OF ADHESION;  Surgeon: Judeth Horn, MD;  Location: [facility];  Service: General;  Laterality: N/A;  . PARTIAL COLECTOMY N/A 09/18/2017   Procedure: ILEOCOLECTOMY;  Surgeon: Judeth Horn, MD;  Location: [facility];  Service: General;  Laterality: N/A;  . PARTIAL COLECTOMY  09/25/2017  . RIGHT/LEFT HEART CATH AND CORONARY ANGIOGRAPHY N/A 06/01/2018   Procedure: RIGHT/LEFT HEART CATH AND CORONARY ANGIOGRAPHY;  Surgeon: Jettie Booze, MD;  Location: [facility];  Service: Cardiovascular;  Laterality: N/A;  . SIGMOIDOSCOPY N/A 09/18/2017   Procedure: SIGMOIDOSCOPY;  Surgeon: Judeth Horn, MD;  Location: [facility];   Service: General;  Laterality: N/A;  . SMALL INTESTINE SURGERY  07/09/2018   EXPLORATION OF ABDOMINAL WOUND ERAS PATHWAYN/; MESH; LYSIS OF ADHESIONS  . TOTAL ABDOMINAL HYSTERECTOMY  1990  . TRIGGER FINGER RELEASE Right 05/213  . TRIGGER FINGER RELEASE Right 04/08/2019   Procedure: RIGHT INDEX RELEASE TRIGGER FINGER/A-1 PULLEY;  Surgeon: Milly Jakob, MD;  Location: [facility];  Service: Orthopedics;  Laterality: Right;  . TUBAL LIGATION  1980s  . VESICOVAGINAL FISTULA CLOSURE W/ TAH       OB History   No obstetric history on file.     Family History  Problem Relation Age of Onset  . Hypertension Father   . Heart disease Father   . Cancer Father        prostate  . Heart disease Other        (Maternal side) Ischemic heart disease  . Diabetes Mellitus II Other   . Arrhythmia Mother   . Diabetes Mellitus II Mother        Borderline DM  . Hypertension Mother   . Asthma Mother   . Cancer Brother   . Dementia Brother   . Hypertension Brother   . Hypertension Daughter   . Hypertension Daughter   . Diabetes Daughter   . Hypertension Daughter   . Atrial fibrillation Daughter   . GER disease Daughter   . Hypertension Son   . Anxiety disorder Son   . Hypothyroidism Son   . Breast cancer Maternal Grandmother     Social History   Tobacco Use  . Smoking status: Never Smoker  . Smokeless tobacco: Never Used  Substance Use Topics  . Alcohol use: Never  . Drug use: Never    Home Medications Prior to Admission medications   Medication Sig Start Date End Date Taking? Authorizing Provider  acetaminophen (TYLENOL) 500 MG tablet Take 500 mg by mouth as needed.   Yes [provider]  albuterol (PROVENTIL HFA;VENTOLIN HFA) 108 (90 Base) MCG/ACT inhaler Inhale 2 puffs into the lungs every 6 (six) hours as needed for wheezing or shortness of breath.    Yes [provider]  allopurinol (ZYLOPRIM) 100 MG tablet TAKE 1 TABLET(100 MG) BY MOUTH DAILY Patient taking  differently: Take 100 mg by mouth daily.  05/21/18  Yes Gildardo Cranker, DO  Calcium Carbonate-Vitamin D (CALTRATE 600+D) 600-400 MG-UNIT tablet Take 1 tablet by mouth 2 (two) times daily.   Yes [provider]  carvedilol (COREG) 25 MG tablet Take  25 mg by mouth 2 (two) times daily with a meal.   Yes [provider]  CRESTOR 10 MG tablet Take 1 tablet (10 mg total) by mouth at bedtime. 02/15/17  Yes Love, Ivan Anchors, PA-C  DULoxetine (CYMBALTA) 30 MG capsule Take 1 capsule (30 mg total) by mouth daily. 10/15/18  Yes Ngetich, Dinah C, NP  gabapentin (NEURONTIN) 300 MG capsule Take 300 mg by mouth at bedtime.   Yes [provider]  insulin regular human CONCENTRATED (HUMULIN R U-500 KWIKPEN) 500 UNIT/ML kwikpen Inject 100-120 Units into the skin See admin instructions. 120 units tid with meals and 100 units hs   Yes [provider]  levothyroxine (SYNTHROID, LEVOTHROID) 88 MCG tablet Take 88 mcg by mouth daily before breakfast.  10/16/15  Yes [provider]  loratadine (CLARITIN) 10 MG tablet Take 10 mg by mouth daily.   Yes [provider]  montelukast (SINGULAIR) 10 MG tablet Take 10 mg by mouth at bedtime.   Yes [provider]  omega-3 acid ethyl esters (LOVAZA) 1 g capsule Take 4 g by mouth at bedtime.    Yes [provider]  pantoprazole (PROTONIX) 40 MG tablet Take 40 mg by mouth every evening. 05/23/18  Yes [provider]  temazepam (RESTORIL) 15 MG capsule Take 1 capsule (15 mg total) by mouth at bedtime as needed for sleep. 05/07/18  Yes Eulas Post, Monica, DO  torsemide (DEMADEX) 20 MG tablet Take 30 mg by mouth daily.   Yes [provider]  traMADol (ULTRAM) 50 MG tablet Take 1 tablet (50 mg total) by mouth every 6 (six) hours as needed for moderate pain or severe pain. 07/15/18  Yes Judeth Horn, MD    Allergies    Ace inhibitors, Glucophage [metformin hcl], Telmisartan, Advicor [niacin-lovastatin er],  Bystolic [nebivolol hcl], Erythromycin, Lipitor [atorvastatin], Lopid [gemfibrozil], and Statins  Review of Systems   Review of Systems  Constitutional:       Per HPI, otherwise negative  HENT:       Per HPI, otherwise negative  Respiratory:       Per HPI, otherwise negative  Cardiovascular:       Per HPI, otherwise negative  Gastrointestinal: Positive for nausea and vomiting.  Endocrine:       Negative aside from HPI  Genitourinary:       Neg aside from HPI   Musculoskeletal:       Per HPI, otherwise negative  Skin: Negative.   Neurological: Positive for weakness. Negative for syncope.    Physical Exam Updated Vital Signs BP (!) 124/50   Pulse 97   Temp (!) 101.8 F (38.8 C) (Oral)   Resp (!) 22   Ht 5\' 5"  (1.651 m)   Wt 105.2 kg   SpO2 94%   BMI 38.61 kg/m   Physical Exam Vitals and nursing note reviewed.  Constitutional:      Appearance: She is well-developed. She is ill-appearing.     Comments: Obese elderly female awake and alert obvious increased work of breathing  HENT:     Head: Normocephalic and atraumatic.  Eyes:     Conjunctiva/sclera: Conjunctivae normal.  Cardiovascular:     Rate and Rhythm: Normal rate and regular rhythm.  Pulmonary:     Effort: Tachypnea and accessory muscle usage present.     Breath sounds: Decreased air movement present. No stridor.  Abdominal:     General: There is no distension.  Skin:    General: Skin is warm  and dry.  Neurological:     Mental Status: She is alert and oriented to person, place, and time.     Cranial Nerves: No cranial nerve deficit.     ED Results / Procedures / Treatments   Labs (all labs ordered are listed, but only abnormal results are displayed) Labs Reviewed  COMPREHENSIVE METABOLIC PANEL - Abnormal; Notable for the following components:      Result Value   Potassium 5.3 (*)    Glucose, Bld 231 (*)    BUN 61 (*)    Creatinine, Ser 2.22 (*)    Calcium 8.7 (*)    Albumin 3.4 (*)     Alkaline Phosphatase 203 (*)    GFR calc non Af Amer 22 (*)    GFR calc Af Amer 25 (*)    All other components within normal limits  CBC WITH DIFFERENTIAL/PLATELET - Abnormal; Notable for the following components:   RBC 3.83 (*)    Hemoglobin 9.9 (*)    HCT 32.6 (*)    MCH 25.8 (*)    RDW 16.6 (*)    Abs Immature Granulocytes 0.13 (*)    All other components within normal limits  BRAIN NATRIURETIC PEPTIDE  URINALYSIS, ROUTINE W REFLEX MICROSCOPIC    EKG EKG Interpretation  Date/Time:  Wednesday August 07 2019 17:12:44 EST Ventricular Rate:  98 PR Interval:    QRS Duration: 147 QT Interval:  426 QTC Calculation: 544 R Axis:   -55 Text Interpretation: ATRIAL SENSING AND PACING Premature ventricular complexes Abnormal ECG Confirmed by Carmin Muskrat [phone]) on 08/07/2019 6:59:27 PM   Radiology DG Chest Port 1 View  Result Date: 08/07/2019 CLINICAL DATA:  Short of breath EXAM: PORTABLE CHEST 1 VIEW COMPARISON:  07/12/2018 FINDINGS: Cardiac enlargement with mild vascular congestion. Negative for edema or effusion. Pacemaker leads unchanged with internal defibrillator. IMPRESSION: Mild vascular congestion without edema. Electronically Signed   By: Franchot Gallo M.D.   On: 08/07/2019 17:27    Procedures Procedures (including critical care time)  Medications Ordered in ED Medications - No data to display  ED Course  I have reviewed the triage vital signs and the nursing notes.  Pertinent labs & imaging results that were available during my care of the patient were reviewed by me and considered in my medical decision making (see chart for details).  7:30 PM Patient awake and alert.  She is now on 2 L nasal cannula, saturation 97%. I reviewed all labs, x-ray findings with her.  With new oxygen requirement, need for supplementation, she will require admission per Most notably, patient also found to have worsening renal dysfunction.   MDM Rules/Calculators/A&P                       This adult female with multiple medical issues including obesity, heart failure, hypertension, diabetes presents with cough, dyspnea, hypoxia on room air, systemic complaints, and positive Covid status. Given this, addition, the patient required admission for further monitoring, management, therapy. Final Clinical Impression(s) / ED Diagnoses Final diagnoses:  COVID-19 virus infection    Rx / DC Orders ED Discharge Orders    None       Carmin Muskrat, MD 08/07/19 1931

## 2019-08-08 LAB — CBC WITH DIFFERENTIAL/PLATELET
Abs Immature Granulocytes: 0.17 10*3/uL — ABNORMAL HIGH (ref 0.00–0.07)
Basophils Absolute: 0 10*3/uL (ref 0.0–0.1)
Basophils Relative: 0 %
Eosinophils Absolute: 0 10*3/uL (ref 0.0–0.5)
Eosinophils Relative: 0 %
HCT: 31.2 % — ABNORMAL LOW (ref 36.0–46.0)
Hemoglobin: 9.7 g/dL — ABNORMAL LOW (ref 12.0–15.0)
Immature Granulocytes: 3 %
Lymphocytes Relative: 17 %
Lymphs Abs: 0.9 10*3/uL (ref 0.7–4.0)
MCH: 26.4 pg (ref 26.0–34.0)
MCHC: 31.1 g/dL (ref 30.0–36.0)
MCV: 85 fL (ref 80.0–100.0)
Monocytes Absolute: 0.2 10*3/uL (ref 0.1–1.0)
Monocytes Relative: 3 %
Neutro Abs: 4.1 10*3/uL (ref 1.7–7.7)
Neutrophils Relative %: 77 %
Platelets: 194 10*3/uL (ref 150–400)
RBC: 3.67 MIL/uL — ABNORMAL LOW (ref 3.87–5.11)
RDW: 16.8 % — ABNORMAL HIGH (ref 11.5–15.5)
WBC: 5.3 10*3/uL (ref 4.0–10.5)
nRBC: 0 % (ref 0.0–0.2)

## 2019-08-08 LAB — COMPREHENSIVE METABOLIC PANEL
ALT: 28 U/L (ref 0–44)
AST: 28 U/L (ref 15–41)
Albumin: 3.1 g/dL — ABNORMAL LOW (ref 3.5–5.0)
Alkaline Phosphatase: 191 U/L — ABNORMAL HIGH (ref 38–126)
Anion gap: 12 (ref 5–15)
BUN: 61 mg/dL — ABNORMAL HIGH (ref 8–23)
CO2: 19 mmol/L — ABNORMAL LOW (ref 22–32)
Calcium: 8.4 mg/dL — ABNORMAL LOW (ref 8.9–10.3)
Chloride: 103 mmol/L (ref 98–111)
Creatinine, Ser: 2.15 mg/dL — ABNORMAL HIGH (ref 0.44–1.00)
GFR calc Af Amer: 26 mL/min — ABNORMAL LOW (ref >60)
GFR calc non Af Amer: 23 mL/min — ABNORMAL LOW (ref >60)
Glucose, Bld: 421 mg/dL — ABNORMAL HIGH (ref 70–99)
Potassium: 6.6 mmol/L (ref 3.5–5.1)
Sodium: 134 mmol/L — ABNORMAL LOW (ref 135–145)
Total Bilirubin: 0.5 mg/dL (ref 0.3–1.2)
Total Protein: 7.5 g/dL (ref 6.5–8.1)

## 2019-08-08 LAB — MAGNESIUM: Magnesium: 2.3 mg/dL (ref 1.7–2.4)

## 2019-08-08 LAB — CBG MONITORING, ED
Glucose-Capillary: 417 mg/dL — ABNORMAL HIGH (ref 70–99)
Glucose-Capillary: 465 mg/dL — ABNORMAL HIGH (ref 70–99)
Glucose-Capillary: 475 mg/dL — ABNORMAL HIGH (ref 70–99)

## 2019-08-08 LAB — C-REACTIVE PROTEIN: CRP: 21.6 mg/dL — ABNORMAL HIGH (ref <1.0)

## 2019-08-08 LAB — D-DIMER, QUANTITATIVE: D-Dimer, Quant: 0.57 ug/mL-FEU — ABNORMAL HIGH (ref 0.00–0.50)

## 2019-08-08 LAB — POTASSIUM: Potassium: 5.8 mmol/L — ABNORMAL HIGH (ref 3.5–5.1)

## 2019-08-08 LAB — HEMOGLOBIN A1C
Hgb A1c MFr Bld: 8.9 % — ABNORMAL HIGH (ref 4.8–5.6)
Mean Plasma Glucose: 208.73 mg/dL

## 2019-08-08 LAB — HIV ANTIBODY (ROUTINE TESTING W REFLEX): HIV Screen 4th Generation wRfx: NONREACTIVE

## 2019-08-08 LAB — GLUCOSE, CAPILLARY
Glucose-Capillary: 393 mg/dL — ABNORMAL HIGH (ref 70–99)
Glucose-Capillary: 408 mg/dL — ABNORMAL HIGH (ref 70–99)

## 2019-08-08 LAB — FERRITIN: Ferritin: 165 ng/mL (ref 11–307)

## 2019-08-08 MED ORDER — INSULIN ASPART 100 UNIT/ML ~~LOC~~ SOLN
0.0000 [IU] | Freq: Every day | SUBCUTANEOUS | Status: DC
  Administered 2019-08-08: 5 [IU] via SUBCUTANEOUS
  Administered 2019-08-09: 3 [IU] via SUBCUTANEOUS

## 2019-08-08 MED ORDER — HEPARIN SODIUM (PORCINE) 5000 UNIT/ML IJ SOLN
7500.0000 [IU] | Freq: Three times a day (TID) | INTRAMUSCULAR | Status: DC
  Administered 2019-08-08 – 2019-08-10 (×7): 7500 [IU] via SUBCUTANEOUS
  Filled 2019-08-08 (×7): qty 2

## 2019-08-08 MED ORDER — SODIUM ZIRCONIUM CYCLOSILICATE 10 G PO PACK
10.0000 g | PACK | Freq: Once | ORAL | Status: DC
  Filled 2019-08-08: qty 1

## 2019-08-08 MED ORDER — INSULIN ASPART 100 UNIT/ML ~~LOC~~ SOLN: 0.0000 [IU] | Freq: Every day | SUBCUTANEOUS | Status: DC

## 2019-08-08 MED ORDER — SODIUM CHLORIDE 0.9 % IV SOLN: INTRAVENOUS | Status: DC

## 2019-08-08 MED ORDER — DEXTROSE-NACL 5-0.45 % IV SOLN: INTRAVENOUS | Status: DC

## 2019-08-08 MED ORDER — DEXTROSE 50 % IV SOLN: 0.0000 mL | INTRAVENOUS | Status: DC | PRN

## 2019-08-08 MED ORDER — INSULIN ASPART 100 UNIT/ML ~~LOC~~ SOLN: 0.0000 [IU] | Freq: Three times a day (TID) | SUBCUTANEOUS | Status: DC

## 2019-08-08 MED ORDER — CALCIUM GLUCONATE-NACL 1-0.675 GM/50ML-% IV SOLN: 1.0000 g | Freq: Once | INTRAVENOUS | Status: DC

## 2019-08-08 MED ORDER — SODIUM BICARBONATE 650 MG PO TABS
650.0000 mg | ORAL_TABLET | Freq: Three times a day (TID) | ORAL | Status: DC
  Administered 2019-08-08 – 2019-08-10 (×7): 650 mg via ORAL
  Filled 2019-08-08 (×10): qty 1

## 2019-08-08 MED ORDER — INSULIN REGULAR(HUMAN) IN NACL 100-0.9 UT/100ML-% IV SOLN: INTRAVENOUS | Status: DC

## 2019-08-08 MED ORDER — INSULIN ASPART 100 UNIT/ML ~~LOC~~ SOLN
15.0000 [IU] | Freq: Three times a day (TID) | SUBCUTANEOUS | Status: DC
  Filled 2019-08-08: qty 0.15

## 2019-08-08 MED ORDER — INSULIN ASPART 100 UNIT/ML ~~LOC~~ SOLN
0.0000 [IU] | Freq: Three times a day (TID) | SUBCUTANEOUS | Status: DC
  Administered 2019-08-09 (×2): 11 [IU] via SUBCUTANEOUS
  Administered 2019-08-09: 12:00:00 15 [IU] via SUBCUTANEOUS
  Administered 2019-08-10: 13:00:00 3 [IU] via SUBCUTANEOUS

## 2019-08-08 MED ORDER — INSULIN ASPART 100 UNIT/ML ~~LOC~~ SOLN
0.0000 [IU] | Freq: Three times a day (TID) | SUBCUTANEOUS | Status: DC
  Filled 2019-08-08: qty 0.2

## 2019-08-08 MED ORDER — INSULIN GLARGINE 100 UNIT/ML ~~LOC~~ SOLN
100.0000 [IU] | Freq: Two times a day (BID) | SUBCUTANEOUS | Status: DC
  Administered 2019-08-08 – 2019-08-09 (×2): 100 [IU] via SUBCUTANEOUS
  Filled 2019-08-08 (×3): qty 1

## 2019-08-08 MED ORDER — INSULIN ASPART 100 UNIT/ML ~~LOC~~ SOLN
20.0000 [IU] | Freq: Three times a day (TID) | SUBCUTANEOUS | Status: DC
  Administered 2019-08-09 (×2): 20 [IU] via SUBCUTANEOUS

## 2019-08-08 NOTE — Progress Notes (Signed)
Inpatient Diabetes Program Recommendations  AACE/ADA: New Consensus Statement on Inpatient Glycemic Control (2015)  Target Ranges:  Prepandial:   less than 140 mg/dL      Peak postprandial:   less than 180 mg/dL (1-2 hours)      Critically ill patients:  140 - 180 mg/dL   Lab Results  Component Value Date   GLUCAP 465 (H) 08/08/2019   HGBA1C 8.9 (H) 08/07/2019    Review of Glycemic Control  Diabetes history: DM2 Outpatient Diabetes medications: U-500 120 units tidwc and 100 units QHS Current orders for Inpatient glycemic control: Lantus 100 units bid, Novolog 0-15 units tidwc and 0-5 units QHS + 10 units tidwc  HgbA1C - 8.9% On Decadron 6 mg QD  Inpatient Diabetes Program Recommendations:     If blood sugars continue > 200 mg/dL, may need to switch to U-500 (1/2 home dose)  Will follow daily.   Thank you. Lorenda Peck, RD, LDN, CDE Inpatient Diabetes Coordinator [phone]

## 2019-08-08 NOTE — ED Notes (Signed)
Dr. Junious Dresser made aware patient CBG 465. Awaiting further orders regarding insulin administration.

## 2019-08-08 NOTE — ED Notes (Signed)
Dr. Junious Dresser made aware of CBG 475.

## 2019-08-08 NOTE — ED Notes (Signed)
20 units of novolog given per Dr. Junious Dresser. Will recheck CBG in one hour.

## 2019-08-08 NOTE — Progress Notes (Signed)
Spoke to [place] in pharmacy regarding 100 units Lantus to be given at 2200. Per Junie Panning and previous notes from Diabetes coordinator/MD this is to be given. Has previously been given 2x per MAR.

## 2019-08-08 NOTE — ED Notes (Signed)
[facility] called for transport. 

## 2019-08-08 NOTE — ED Notes (Signed)
Per [facility], [facility] will transport patient to [facility].

## 2019-08-08 NOTE — ED Notes (Signed)
Report given to [facility]. 

## 2019-08-08 NOTE — ED Notes (Signed)
Patient given meal tray.

## 2019-08-08 NOTE — Progress Notes (Signed)
PROGRESS NOTE    Crystal [name]  ACZ:[id number] DOB: 05-02-1951 DOA: 08/07/2019 PCP: Crist Infante, MD    Brief Narrative: Crystal [name] is a 69 y.o. female with medical history significant for nonischemic cardiomyopathy with CRT-D, insulin-dependent diabetes mellitus, chronic kidney disease stage IIIb, hypothyroidism, depression, insomnia, and chronic pain, now presenting to the emergency department with a week of shortness of breath, cough, fevers, malaise, nausea, vomiting, and anorexia.  The patient's daughter tested positive for COVID-19 several days ago and the patient had presumed that she was also infected.  With progressing symptoms, she was seen in an outpatient clinic today where she was confirmed to be positive for COVID-19 (has paper copy of results with her from [facility]) and was hypoxic in the mid 80s on room air, and therefore directed to the ED. She denies chest pain or leg swelling, reports loose stools have improved, and denies abdominal pain.   Please see HPI for detailed information    Assessment & Plan:   Principal Problem:   Acute hypoxemic respiratory failure due to COVID-19 [facility]) Active Problems:   Chronic systolic heart failure (HCC)   Adjustment disorder with depressed mood   Acute renal failure superimposed on stage 3 chronic kidney disease (HCC)   Type 2 diabetes mellitus with stage 3 chronic kidney disease, with long-term current use of insulin (HCC)   Normocytic anemia  Clinical problems list 1.  Acute hypoxic respiratory failure secondary to COVID-19 virus infection 2.  Chronic heart failure with reduced ejection fraction 3.  Diabetes mellitus type 2 4.  Acute on chronic kidney disease stage IIIb 5.  Hypothyroidism 6.  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  Chronic pain syndrome 8.  Depression/insomnia 9.  Hyperkalemia  1.  Acute hypoxic respiratory failure secondary to COVID-19 virus infection.  Patient  presenting with cough and shortness of breath. She tested positive on 08/07/2019.  She was noted with desaturation in the 80s but in the ED, patient was saturating at 91% on room air. She is currently not requiring oxygen to maintain adequate saturation. She started on remdesivir, dexamethasone and supportive therapy Continue with bronchodilators and oxygen supplementation as needed  2.  Chronic heart failure with reduced ejection fraction, NYHA II-III, ACC/AHA C.  Not in acute exacerbation.  Echocardiogram of 10/01/2018 showed ejection fraction of 25 to 30% with normal left ventricular systolic function. Continue with guideline directed medical therapy, aspirin, statin, beta-blocker and ACE inhibitor Continue with 2 g sodium reduced diet Fluid restriction to 1500 cc/day Continue with daily weight  3.  Diabetes mellitus type 2.  Patient noted to be on 120 units of insulin 3 times a day for adequate blood sugar control and A1c reduction by her primary care physician.  She is not on long-acting insulin which was deemed not effective by PCP Continue with sliding scale insulin and mealtime insulin Patient started on Lantus 100 units twice daily Adjust insulin as necessary Continue with fingersticks before meals and at bedtime Hypoglycemic protocol  4.  Acute on chronic kidney disease stage IIIb.  Serum creatinine appears baseline of 2.2.  Likely due to longstanding diabetes mellitus Continue to monitor renal function Avoid nephrotoxic agents\  5 hypothyroidism Continue with levothyroxine  6.  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 Bilateral global wheezing on auscultation Continue with bronchodilator Oxygen supplementation as needed  7.  Chronic pain syndrome Continue with Cymbalta.  And gabapentin  8.  Depression/insomnia Continue with Restoril and Cymbalta  9.  Hyperkalemia in the setting of chronic kidney disease versus lab error.  Patient received insulin and  glucose along with calcium gluconate. Patient endorsed previous hyperkalemia especially with blood draws. Repeat potassium was 5.8 Scheduled for another repeat in 4 hours.         DVT prophylaxis: Heparin at 7500 units 3 times daily  Code Status: Full code  Family Communication: None  Disposition Plan: Transfer to [facility]   Consultants:   None  Procedures: None  Antimicrobials: None     Subjective: Patient was seen and examined at bedside.  Not in acute distress.  Ambulating in room.  Morbidly obese.  Alert and oriented x3.  Patient not on oxygen to maintain saturation above 90%  Objective: Vitals:   08/08/19 0700 08/08/19 0800 08/08/19 0902 08/08/19 0915  BP: (!) 114/49 (!) 123/59 (!) 106/48   Pulse: 72 72 75 76  Resp: 18 17 19 17   Temp:      TempSrc:      SpO2: 94% 91% 93% 95%  Weight:      Height:        Intake/Output Summary (Last 24 hours) at 08/08/2019 1151 Last data filed at 08/08/2019 0551 Gross per 24 hour  Intake --  Output 800 ml  Net -800 ml   Filed Weights   08/07/19 1728  Weight: 105.2 kg    Examination:  General exam: Appears calm and comfortable. Obese  Respiratory system: Clear to auscultation. Respiratory effort normal. Cardiovascular system: S1 & S2 heard, RRR. No JVD, murmurs, rubs, gallops or clicks. No pedal edema. Gastrointestinal system: Abdomen is nondistended, soft and nontender. No organomegaly or masses felt. Normal bowel sounds heard. Central nervous system: Alert and oriented. No focal neurological deficits. Extremities: Symmetric 5 x 5 power. Skin: No rashes, lesions or ulcers Psychiatry: Judgement and insight appear normal. Mood & affect appropriate.     Data Reviewed: I have personally reviewed following labs and imaging studies  CBC: Recent Labs  Lab 08/07/19 1732 08/08/19 0535  WBC 7.6 5.3  NEUTROABS 5.5 4.1  HGB 9.9* 9.7*  HCT 32.6* 31.2*  MCV 85.1 85.0  PLT 190 678   Basic Metabolic  Panel: Recent Labs  Lab 08/07/19 1732 08/08/19 0535 08/08/19 0813  NA 140 134*  --   K 5.3* 6.6* 5.8*  CL 109 103  --   CO2 22 19*  --   GLUCOSE 231* 421*  --   BUN 61* 61*  --   CREATININE 2.22* 2.15*  --   CALCIUM 8.7* 8.4*  --   MG  --  2.3  --    GFR: Estimated Creatinine Clearance: 29.7 mL/min (A) (by C-G formula based on SCr of 2.15 mg/dL (H)). Liver Function Tests: Recent Labs  Lab 08/07/19 1732 08/08/19 0535  AST 29 28  ALT 30 28  ALKPHOS 203* 191*  BILITOT 0.7 0.5  PROT 7.8 7.5  ALBUMIN 3.4* 3.1*   No results for input(s): LIPASE, AMYLASE in the last 168 hours. No results for input(s): AMMONIA in the last 168 hours. Coagulation Profile: No results for input(s): INR, PROTIME in the last 168 hours. Cardiac Enzymes: No results for input(s): CKTOTAL, CKMB, CKMBINDEX, TROPONINI in the last 168 hours. BNP (last 3 results) No results for input(s): PROBNP in the last 8760 hours. HbA1C: Recent Labs    08/07/19 2108  HGBA1C 8.9*   CBG: Recent Labs  Lab 08/07/19 2228 08/08/19 0749  GLUCAP 247* 417*   Lipid Profile:  No results for input(s): CHOL, HDL, LDLCALC, TRIG, CHOLHDL, LDLDIRECT in the last 72 hours. Thyroid Function Tests: No results for input(s): TSH, T4TOTAL, FREET4, T3FREE, THYROIDAB in the last 72 hours. Anemia Panel: Recent Labs    08/07/19 1734 08/08/19 0535  FERRITIN 142 165   Sepsis Labs: Recent Labs  Lab 08/07/19 1733  PROCALCITON 1.00    No results found for this or any previous visit (from the past 240 hour(s)).       Radiology Studies: DG Chest Port 1 View  Result Date: 08/07/2019 CLINICAL DATA:  Short of breath EXAM: PORTABLE CHEST 1 VIEW COMPARISON:  07/12/2018 FINDINGS: Cardiac enlargement with mild vascular congestion. Negative for edema or effusion. Pacemaker leads unchanged with internal defibrillator. IMPRESSION: Mild vascular congestion without edema. Electronically Signed   By: Franchot Gallo M.D.   On: 08/07/2019  17:27        Scheduled Meds: . allopurinol  100 mg Oral Daily  . carvedilol  25 mg Oral BID WC  . dexamethasone (DECADRON) injection  6 mg Intravenous Q24H  . DULoxetine  30 mg Oral Daily  . gabapentin  300 mg Oral QHS  . heparin  7,500 Units Subcutaneous Q8H  . insulin aspart  0-15 Units Subcutaneous TID WC  . insulin aspart  0-5 Units Subcutaneous QHS  . insulin aspart  10 Units Subcutaneous TID WC  . insulin glargine  100 Units Subcutaneous BID  . levothyroxine  88 mcg Oral Q0600  . montelukast  10 mg Oral QHS  . pantoprazole  40 mg Oral QPM  . rosuvastatin  10 mg Oral q1800  . sodium bicarbonate  650 mg Oral TID  . sodium chloride flush  3 mL Intravenous Q12H  . sodium chloride flush  3 mL Intravenous Q12H   Continuous Infusions: . sodium chloride    . sodium chloride 125 mL/hr at 08/08/19 1111  . calcium gluconate Stopped (08/08/19 0910)  . remdesivir 100 mg in NS 100 mL 100 mg (08/08/19 1111)     LOS: 1 day    Time spent: [place], MD [facility] Pager [phone]   If 7PM-7AM, please contact night-coverage [email or web address] Password [facility] 08/08/2019, 11:51 AM

## 2019-08-08 NOTE — Progress Notes (Signed)
Inpatient Diabetes Program Recommendations  AACE/ADA: New Consensus Statement on Inpatient Glycemic Control (2015)  Target Ranges:  Prepandial:   less than 140 mg/dL      Peak postprandial:   less than 180 mg/dL (1-2 hours)      Critically ill patients:  140 - 180 mg/dL   Lab Results  Component Value Date   GLUCAP 465 (H) 08/08/2019   HGBA1C 8.9 (H) 08/07/2019    Review of Glycemic Control  See previous note dated 01/14 with recs.  CO2 - 19       AG - 12 Blood sugars > 400 mg/dL Prevent going into DKA with IV insulin drip  Inpatient Diabetes Program Recommendations:    IV insulin hyperglycemia, per [employer].  Follow closely at [facility].  Thank you. Lorenda Peck, RD, LDN, CDE Inpatient Diabetes Coordinator [phone]

## 2019-08-08 NOTE — ED Notes (Signed)
Pt placed in position of comfort, and given water until breakfast tray arrived.

## 2019-08-08 NOTE — ED Notes (Addendum)
Date and time results received: 08/08/19 7:19 AM  (use smartphrase ".now" to insert current time)  Test: K+ Critical Value: 6.6  Name of Provider Notified: Junious Dresser, MD  Orders Received? Or Actions Taken?: See Chart

## 2019-08-09 LAB — CBC WITH DIFFERENTIAL/PLATELET
Abs Immature Granulocytes: 0.35 10*3/uL — ABNORMAL HIGH (ref 0.00–0.07)
Basophils Absolute: 0 10*3/uL (ref 0.0–0.1)
Basophils Relative: 0 %
Eosinophils Absolute: 0 10*3/uL (ref 0.0–0.5)
Eosinophils Relative: 0 %
HCT: 30.5 % — ABNORMAL LOW (ref 36.0–46.0)
Hemoglobin: 9.4 g/dL — ABNORMAL LOW (ref 12.0–15.0)
Immature Granulocytes: 5 %
Lymphocytes Relative: 17 %
Lymphs Abs: 1.1 10*3/uL (ref 0.7–4.0)
MCH: 25.8 pg — ABNORMAL LOW (ref 26.0–34.0)
MCHC: 30.8 g/dL (ref 30.0–36.0)
MCV: 83.6 fL (ref 80.0–100.0)
Monocytes Absolute: 0.4 10*3/uL (ref 0.1–1.0)
Monocytes Relative: 7 %
Neutro Abs: 4.6 10*3/uL (ref 1.7–7.7)
Neutrophils Relative %: 71 %
Platelets: 218 10*3/uL (ref 150–400)
RBC: 3.65 MIL/uL — ABNORMAL LOW (ref 3.87–5.11)
RDW: 16.7 % — ABNORMAL HIGH (ref 11.5–15.5)
WBC: 6.5 10*3/uL (ref 4.0–10.5)
nRBC: 0 % (ref 0.0–0.2)

## 2019-08-09 LAB — COMPREHENSIVE METABOLIC PANEL
ALT: 27 U/L (ref 0–44)
AST: 27 U/L (ref 15–41)
Albumin: 3.2 g/dL — ABNORMAL LOW (ref 3.5–5.0)
Alkaline Phosphatase: 167 U/L — ABNORMAL HIGH (ref 38–126)
Anion gap: 9 (ref 5–15)
BUN: 61 mg/dL — ABNORMAL HIGH (ref 8–23)
CO2: 21 mmol/L — ABNORMAL LOW (ref 22–32)
Calcium: 8.6 mg/dL — ABNORMAL LOW (ref 8.9–10.3)
Chloride: 107 mmol/L (ref 98–111)
Creatinine, Ser: 1.71 mg/dL — ABNORMAL HIGH (ref 0.44–1.00)
GFR calc Af Amer: 35 mL/min — ABNORMAL LOW (ref >60)
GFR calc non Af Amer: 30 mL/min — ABNORMAL LOW (ref >60)
Glucose, Bld: 342 mg/dL — ABNORMAL HIGH (ref 70–99)
Potassium: 5.7 mmol/L — ABNORMAL HIGH (ref 3.5–5.1)
Sodium: 137 mmol/L (ref 135–145)
Total Bilirubin: 0.5 mg/dL (ref 0.3–1.2)
Total Protein: 7.6 g/dL (ref 6.5–8.1)

## 2019-08-09 LAB — D-DIMER, QUANTITATIVE: D-Dimer, Quant: 0.59 ug/mL-FEU — ABNORMAL HIGH (ref 0.00–0.50)

## 2019-08-09 LAB — C-REACTIVE PROTEIN: CRP: 16.1 mg/dL — ABNORMAL HIGH (ref <1.0)

## 2019-08-09 LAB — GLUCOSE, CAPILLARY
Glucose-Capillary: 298 mg/dL — ABNORMAL HIGH (ref 70–99)
Glucose-Capillary: 315 mg/dL — ABNORMAL HIGH (ref 70–99)
Glucose-Capillary: 276 mg/dL — ABNORMAL HIGH (ref 70–99)
Glucose-Capillary: 268 mg/dL — ABNORMAL HIGH (ref 70–99)

## 2019-08-09 LAB — FERRITIN: Ferritin: 211 ng/mL (ref 11–307)

## 2019-08-09 MED ORDER — INSULIN GLARGINE 100 UNIT/ML ~~LOC~~ SOLN
110.0000 [IU] | Freq: Two times a day (BID) | SUBCUTANEOUS | Status: DC
  Administered 2019-08-09 – 2019-08-10 (×2): 110 [IU] via SUBCUTANEOUS
  Filled 2019-08-09 (×3): qty 1.1

## 2019-08-09 MED ORDER — SODIUM ZIRCONIUM CYCLOSILICATE 10 G PO PACK
10.0000 g | PACK | Freq: Once | ORAL | Status: AC
  Administered 2019-08-09: 15:00:00 10 g via ORAL
  Filled 2019-08-09: qty 1

## 2019-08-09 MED ORDER — INSULIN ASPART 100 UNIT/ML ~~LOC~~ SOLN
30.0000 [IU] | Freq: Three times a day (TID) | SUBCUTANEOUS | Status: DC
  Administered 2019-08-09: 30 [IU] via SUBCUTANEOUS

## 2019-08-09 NOTE — Progress Notes (Signed)
Family updated by pt.  

## 2019-08-09 NOTE — Plan of Care (Signed)
  Problem: Education: Goal: Knowledge of risk factors and measures for prevention of condition will improve Outcome: Progressing   Problem: Coping: Goal: Psychosocial and spiritual needs will be supported Outcome: Progressing   Problem: Respiratory: Goal: Will maintain a patent airway Outcome: Progressing Goal: Complications related to the disease process, condition or treatment will be avoided or minimized Outcome: Progressing   

## 2019-08-09 NOTE — Progress Notes (Signed)
Crystal [name]  YSA:[id number] DOB: [birth year] DOA: 08/07/2019 PCP: Crist Infante, MD    Brief Narrative:  [phone] w/ a hx of systolic CHF, CRT-D, DM, CKD stage IIIb, hypothyroidism, depression, insomnia, and chronic pain who presented to the ED w/ SOB, cough, fever, N/V, and anorexia. Her daughter was known to be COVID +. She presented to her PCP who found her sats to be in the 80s on RA w/ a + COVID test, and directed her to the ED.   Significant Events: 1/13 admit via PCP [facility] - +COVID test  1/14 transfer to [facility]  COVID-19 specific Treatment: Decadron 1/13 > Remdesivir 1/13 >  Antimicrobials:  none  Subjective: States that she remains somewhat sob, but is feeling better overall. Having some chest wall pain w/ coughing, but denies abdom pain.   Assessment & Plan:  COVID Pneumonia - acute hypoxic resp failure Cont remdesivir + decadron - no actemra given elevated procalcitonin and relative clinical stability -   Recent Labs  Lab 08/07/19 1732 08/07/19 1733 08/07/19 1734 08/08/19 0535 08/09/19 0130  DDIMER  --  0.76*  --  0.57* 0.59*  FERRITIN  --   --  142 165 211  CRP  --   --  20.9* 21.6* 16.1*  ALT 30  --   --  28 27  PROCALCITON  --  1.00  --   --   --     Chronic Systolic CHF EF 57-32% via TTE March 2020 - follow daily weights - cont ACE and BB   Uncontrolled DM2 w/ hyperglycemia  Requires very high doses of insulin at home - follow CBG trend - may require insulin gtt while on decadron   Acute on CKD Stage IIIb Baseline crt ~2.2 - crt improving - follow   Hypothryroidism Cont home sythroid dose  Asthma/COPD Well compensated at present   Chronic pain Well controlled presently   Hyperkalemia  Trending downward - follow trend - repeat lokelma today   DVT prophylaxis: SQ heparin  Code Status: FULL CODE Family Communication:  Disposition Plan: tele bed   Consultants:  none  Objective: Blood pressure 132/69, pulse 67, temperature (!) 97.5 F  (36.4 C), temperature source Oral, resp. rate 17, height 5\' 5"  (1.651 m), weight 105.2 kg, SpO2 97 %.  Intake/Output Summary (Last 24 hours) at 08/09/2019 1017 Last data filed at 08/09/2019 1000 Gross per 24 hour  Intake 1295 ml  Output -  Net 1295 ml   Filed Weights   08/07/19 1728  Weight: 105.2 kg    Examination: General: No acute respiratory distress Lungs: fine scattered crackles diffusely  Cardiovascular: Regular rate and rhythm without murmur gallop or rub normal S1 and S2 Abdomen: Nontender, nondistended, soft, bowel sounds positive, no rebound, no ascites, no appreciable mass Extremities: No significant cyanosis, clubbing, or edema bilateral lower extremities  CBC: Recent Labs  Lab 08/07/19 1732 08/08/19 0535 08/09/19 0130  WBC 7.6 5.3 6.5  NEUTROABS 5.5 4.1 4.6  HGB 9.9* 9.7* 9.4*  HCT 32.6* 31.2* 30.5*  MCV 85.1 85.0 83.6  PLT 190 194 202   Basic Metabolic Panel: Recent Labs  Lab 08/07/19 1732 08/07/19 1732 08/08/19 0535 08/08/19 0813 08/09/19 0130  NA 140  --  134*  --  137  K 5.3*   < > 6.6* 5.8* 5.7*  CL 109  --  103  --  107  CO2 22  --  19*  --  21*  GLUCOSE 231*  --  421*  --  342*  BUN 61*  --  61*  --  61*  CREATININE 2.22*  --  2.15*  --  1.71*  CALCIUM 8.7*  --  8.4*  --  8.6*  MG  --   --  2.3  --   --    < > = values in this interval not displayed.   GFR: Estimated Creatinine Clearance: 37.4 mL/min (A) (by C-G formula based on SCr of 1.71 mg/dL (H)).  Liver Function Tests: Recent Labs  Lab 08/07/19 1732 08/08/19 0535 08/09/19 0130  AST 29 28 27   ALT 30 28 27   ALKPHOS 203* 191* 167*  BILITOT 0.7 0.5 0.5  PROT 7.8 7.5 7.6  ALBUMIN 3.4* 3.1* 3.2*    HbA1C: Hemoglobin A1C  Date/Time Value Ref Range Status  10/05/2015 12:00 AM 7.2  Final   Hgb A1c MFr Bld  Date/Time Value Ref Range Status  08/07/2019 09:08 PM 8.9 (H) 4.8 - 5.6 % Final    Comment:    (NOTE) Pre diabetes:          5.7%-6.4% Diabetes:              >6.4%  Glycemic control for   <7.0% adults with diabetes   07/10/2018 01:50 AM 10.4 (H) 4.8 - 5.6 % Final    Comment:    (NOTE) Pre diabetes:          5.7%-6.4% Diabetes:              >6.4% Glycemic control for   <7.0% adults with diabetes     CBG: Recent Labs  Lab 08/08/19 1326 08/08/19 1524 08/08/19 1759 08/08/19 2027 08/09/19 0811  GLUCAP 465* 475* 393* 408* 298*     Scheduled Meds: . allopurinol  100 mg Oral Daily  . carvedilol  25 mg Oral BID WC  . dexamethasone (DECADRON) injection  6 mg Intravenous Q24H  . DULoxetine  30 mg Oral Daily  . gabapentin  300 mg Oral QHS  . heparin  7,500 Units Subcutaneous Q8H  . insulin aspart  0-20 Units Subcutaneous TID WC  . insulin aspart  0-5 Units Subcutaneous QHS  . insulin aspart  20 Units Subcutaneous TID WC  . insulin glargine  100 Units Subcutaneous BID  . levothyroxine  88 mcg Oral Q0600  . montelukast  10 mg Oral QHS  . pantoprazole  40 mg Oral QPM  . rosuvastatin  10 mg Oral q1800  . sodium bicarbonate  650 mg Oral TID  . sodium zirconium cyclosilicate  10 g Oral Once   Continuous Infusions: . calcium gluconate Stopped (08/08/19 0910)  . remdesivir 100 mg in NS 100 mL 100 mg (08/09/19 0905)     LOS: 2 days   Cherene Altes, MD [facility] Office  [phone] Pager - Text Page per Amion  If 7PM-7AM, please contact night-coverage per Amion 08/09/2019, 10:17 AM

## 2019-08-09 NOTE — Progress Notes (Signed)
This nurse spoke with daughter Odis Luster and update was given.

## 2019-08-09 NOTE — Progress Notes (Addendum)
Results for DYLYNN, KETNER (MRN [id number]) as of 08/09/2019 11:57  Ref. Range 08/08/2019 15:24 08/08/2019 17:59 08/08/2019 20:27 08/09/2019 08:11 08/09/2019 11:35  Glucose-Capillary Latest Ref Range: 70 - 99 mg/dL 475 (H) 393 (H) 408 (H) 298 (H) 315 (H)  Noted that blood sugars continue to be greater than 200 mg/dl.  Spoke with pharmacist on the phone covering this patient.  Discussed using U-500 insulin since patient takes at home. Concerns expressed regarding getting U-500 insulin from [place], staff RNs that are not trained for giving concentrated insulin as U-500, and absorption of Lantus with such high doses.  Pharmacy recommends giving Lantus 50 units every 6 hours which would be equal to current dose ordered. This would help absorption. Continue Novolog RESISTANT correction scale TID & HS. May need to increase Novolog meal coverage to 25 units TID if blood sugars continue to be elevated.  If blood sugars cannot be controlled with SQ insulin, may need to start IV insulin.   Will continue to monitor blood sugars while in the hospital.   Harvel Ricks RN BSN CDE Diabetes Coordinator Pager: [phone]  8am-5pm

## 2019-08-10 LAB — COMPREHENSIVE METABOLIC PANEL
ALT: 29 U/L (ref 0–44)
AST: 38 U/L (ref 15–41)
Albumin: 3.3 g/dL — ABNORMAL LOW (ref 3.5–5.0)
Alkaline Phosphatase: 155 U/L — ABNORMAL HIGH (ref 38–126)
Anion gap: 10 (ref 5–15)
BUN: 57 mg/dL — ABNORMAL HIGH (ref 8–23)
CO2: 21 mmol/L — ABNORMAL LOW (ref 22–32)
Calcium: 8.7 mg/dL — ABNORMAL LOW (ref 8.9–10.3)
Chloride: 104 mmol/L (ref 98–111)
Creatinine, Ser: 1.55 mg/dL — ABNORMAL HIGH (ref 0.44–1.00)
GFR calc Af Amer: 39 mL/min — ABNORMAL LOW (ref >60)
GFR calc non Af Amer: 34 mL/min — ABNORMAL LOW (ref >60)
Glucose, Bld: 169 mg/dL — ABNORMAL HIGH (ref 70–99)
Potassium: 5.1 mmol/L (ref 3.5–5.1)
Sodium: 135 mmol/L (ref 135–145)
Total Bilirubin: 0.2 mg/dL — ABNORMAL LOW (ref 0.3–1.2)
Total Protein: 7.3 g/dL (ref 6.5–8.1)

## 2019-08-10 LAB — CBC WITH DIFFERENTIAL/PLATELET
Abs Immature Granulocytes: 1.83 10*3/uL — ABNORMAL HIGH (ref 0.00–0.07)
Basophils Absolute: 0.1 10*3/uL (ref 0.0–0.1)
Basophils Relative: 1 %
Eosinophils Absolute: 0 10*3/uL (ref 0.0–0.5)
Eosinophils Relative: 0 %
HCT: 31.8 % — ABNORMAL LOW (ref 36.0–46.0)
Hemoglobin: 9.9 g/dL — ABNORMAL LOW (ref 12.0–15.0)
Immature Granulocytes: 15 %
Lymphocytes Relative: 12 %
Lymphs Abs: 1.5 10*3/uL (ref 0.7–4.0)
MCH: 26.1 pg (ref 26.0–34.0)
MCHC: 31.1 g/dL (ref 30.0–36.0)
MCV: 83.9 fL (ref 80.0–100.0)
Monocytes Absolute: 0.7 10*3/uL (ref 0.1–1.0)
Monocytes Relative: 5 %
Neutro Abs: 8.3 10*3/uL — ABNORMAL HIGH (ref 1.7–7.7)
Neutrophils Relative %: 67 %
Platelets: 253 10*3/uL (ref 150–400)
RBC: 3.79 MIL/uL — ABNORMAL LOW (ref 3.87–5.11)
RDW: 16.5 % — ABNORMAL HIGH (ref 11.5–15.5)
WBC: 12.3 10*3/uL — ABNORMAL HIGH (ref 4.0–10.5)
nRBC: 0.4 % — ABNORMAL HIGH (ref 0.0–0.2)

## 2019-08-10 LAB — GLUCOSE, CAPILLARY
Glucose-Capillary: 109 mg/dL — ABNORMAL HIGH (ref 70–99)
Glucose-Capillary: 148 mg/dL — ABNORMAL HIGH (ref 70–99)

## 2019-08-10 LAB — FERRITIN: Ferritin: 243 ng/mL (ref 11–307)

## 2019-08-10 LAB — D-DIMER, QUANTITATIVE: D-Dimer, Quant: 0.59 ug/mL-FEU — ABNORMAL HIGH (ref 0.00–0.50)

## 2019-08-10 LAB — C-REACTIVE PROTEIN: CRP: 8.7 mg/dL — ABNORMAL HIGH (ref <1.0)

## 2019-08-10 MED ORDER — INSULIN ASPART 100 UNIT/ML ~~LOC~~ SOLN
22.0000 [IU] | Freq: Three times a day (TID) | SUBCUTANEOUS | Status: DC
  Administered 2019-08-10: 22 [IU] via SUBCUTANEOUS

## 2019-08-10 NOTE — Progress Notes (Signed)
Performed walking oxygen saturation assessment of patient as she ambulated in room on RA. Patient saturation maintained around 90% with ambulation.  HR 110's

## 2019-08-10 NOTE — Discharge Summary (Signed)
DISCHARGE SUMMARY  Crystal [name]  MR#: [id number]  DOB:[birth year]  Date of Admission: 08/07/2019 Date of Discharge: 08/10/2019  Attending Physician:Stokely Jeancharles Hennie Duos, MD  Patient's OEV:[id number], Crystal [place], MD  Consults: none  Disposition: D/C home   Date of Positive COVID Test: 08/07/19  Date Quarantine Ends: 08/28/2019  COVID-19 specific Treatment: Decadron 1/13 > 1/16 Remdesivir 1/13 > 1/16  Follow-up Appts: Follow-up Information    Crist Infante, MD Follow up in 7 day(s).   Specialty: Internal Medicine Contact information: [place] [address] [phone]        Jettie Booze, MD .   Specialties: Cardiology, Radiology, Interventional Cardiology Contact information: [address] [place] [address] [phone]           Tests Needing Follow-up: -assess CBG control  -monitor O2 sats   Discharge Diagnoses: COVID Pneumonia Acute hypoxic resp failure Chronic Systolic CHF Uncontrolled DM2 w/ hyperglycemia  Acute on CKD Stage IIIb Hypothryroidism Asthma/COPD Chronic pain Hyperkalemia   Initial presentation: 69yo w/ a hx of systolic CHF, CRT-D, DM, CKD stage IIIb, hypothyroidism, depression, insomnia, and chronic pain who presented to the ED w/ SOB, cough, fever, N/V, and anorexia. Her daughter was known to be COVID +. She presented to her PCP who found her sats to be in the 80s on RA w/ a + COVID test, and directed her to the ED.   Hospital Course:  COVID Pneumonia - acute hypoxic resp failure Completed a course of remdesivir - given the high risk for severely uncontrolled DM and the fact that the patient was no longer hypoxic the decision was made to discontinue Decadron at the time of discharge - no actemra given elevated procalcitonin and relative clinical stability - saturations were confirmed to be consistently 90% or better both at rest and with exertion prior to discharge home  Chronic Systolic CHF EF  02-58% via TTE March 2020 - follow daily weights - cont ACE and BB - net +~1300cc since admit -resume usual home medications at time of discharge  Uncontrolled DM2 w/ hyperglycemia  Requires very high doses of insulin at home -Decadron discontinued at time of discharge and patient to resume usual insulin regimen at home  Acute on CKD Stage IIIb Baseline crt ~2.2 - crt below reported baseline at time of d/c   Hypothryroidism Cont home sythroid dose  Asthma/COPD Well compensated during this admit   Chronic pain Well controlled presently   Hyperkalemia  Resolved prior to d/c home    Allergies as of 08/10/2019      Reactions   Ace Inhibitors Swelling, Other (See Comments)   Angioedema   Glucophage [metformin Hcl] Other (See Comments)   Renal failure   Telmisartan Other (See Comments)   AVOID ARB/ ACEi in this patient due to recurrent AKI   Advicor [niacin-lovastatin Er] Other (See Comments)   Muscle aches   Bystolic [nebivolol Hcl] Swelling, Other (See Comments)   UNSPECIFIED SWELLING/EDEMA   Erythromycin Diarrhea, Nausea And Vomiting, Other (See Comments)   *DERIVATIVES*   Lipitor [atorvastatin] Other (See Comments)   Muscle aches   Lopid [gemfibrozil] Other (See Comments)   Muscle aches   Statins Other (See Comments)   Muscle aches tolerates crestor      Medication List    TAKE these medications   acetaminophen 500 MG tablet Commonly known as: TYLENOL Take 500 mg by mouth as needed.   albuterol 108 (90 Base) MCG/ACT inhaler Commonly known as: VENTOLIN HFA Inhale 2  puffs into the lungs every 6 (six) hours as needed for wheezing or shortness of breath.   allopurinol 100 MG tablet Commonly known as: ZYLOPRIM TAKE 1 TABLET(100 MG) BY MOUTH DAILY What changed: See the new instructions.   Caltrate 600+D 600-400 MG-UNIT tablet Generic drug: Calcium Carbonate-Vitamin D Take 1 tablet by mouth 2 (two) times daily.   carvedilol 25 MG tablet Commonly known as:  COREG Take 25 mg by mouth 2 (two) times daily with a meal.   Crestor 10 MG tablet Generic drug: rosuvastatin Take 1 tablet (10 mg total) by mouth at bedtime.   DULoxetine 30 MG capsule Commonly known as: CYMBALTA Take 1 capsule (30 mg total) by mouth daily.   gabapentin 300 MG capsule Commonly known as: NEURONTIN Take 300 mg by mouth at bedtime.   HumuLIN R U-500 KwikPen 500 UNIT/ML kwikpen Generic drug: insulin regular human CONCENTRATED Inject 100-120 Units into the skin See admin instructions. 120 units tid with meals and 100 units hs   levothyroxine 88 MCG tablet Commonly known as: SYNTHROID Take 88 mcg by mouth daily before breakfast.   loratadine 10 MG tablet Commonly known as: CLARITIN Take 10 mg by mouth daily.   montelukast 10 MG tablet Commonly known as: SINGULAIR Take 10 mg by mouth at bedtime.   omega-3 acid ethyl esters 1 g capsule Commonly known as: LOVAZA Take 4 g by mouth at bedtime.   pantoprazole 40 MG tablet Commonly known as: PROTONIX Take 40 mg by mouth every evening.   temazepam 15 MG capsule Commonly known as: RESTORIL Take 1 capsule (15 mg total) by mouth at bedtime as needed for sleep.   torsemide 20 MG tablet Commonly known as: DEMADEX Take 30 mg by mouth daily.   traMADol 50 MG tablet Commonly known as: ULTRAM Take 1 tablet (50 mg total) by mouth every 6 (six) hours as needed for moderate pain or severe pain.       Day of Discharge BP (!) 164/60 (BP Location: Right Arm)   Pulse 60   Temp 98.1 F (36.7 C) (Oral)   Resp 18   Ht 5\' 5"  (1.651 m)   Wt 105.2 kg   SpO2 94%   BMI 38.61 kg/m   Physical Exam: General: No acute respiratory distress Lungs: Clear to auscultation bilaterally without wheezes or crackles Cardiovascular: Regular rate and rhythm without murmur gallop or rub normal S1 and S2 Abdomen: Nontender, nondistended, soft, bowel sounds positive, no rebound, no ascites, no appreciable mass Extremities: No  significant cyanosis, clubbing, or edema bilateral lower extremities  Basic Metabolic Panel: Recent Labs  Lab 08/07/19 1732 08/08/19 0535 08/08/19 0813 08/09/19 0130 08/10/19 0047  NA 140 134*  --  137 135  K 5.3* 6.6* 5.8* 5.7* 5.1  CL 109 103  --  107 104  CO2 22 19*  --  21* 21*  GLUCOSE 231* 421*  --  342* 169*  BUN 61* 61*  --  61* 57*  CREATININE 2.22* 2.15*  --  1.71* 1.55*  CALCIUM 8.7* 8.4*  --  8.6* 8.7*  MG  --  2.3  --   --   --     Liver Function Tests: Recent Labs  Lab 08/07/19 1732 08/08/19 0535 08/09/19 0130 08/10/19 0047  AST 29 28 27  38  ALT 30 28 27 29   ALKPHOS 203* 191* 167* 155*  BILITOT 0.7 0.5 0.5 0.2*  PROT 7.8 7.5 7.6 7.3  ALBUMIN 3.4* 3.1* 3.2* 3.3*    CBC:  Recent Labs  Lab 08/07/19 1732 08/08/19 0535 08/09/19 0130 08/10/19 0047  WBC 7.6 5.3 6.5 12.3*  NEUTROABS 5.5 4.1 4.6 8.3*  HGB 9.9* 9.7* 9.4* 9.9*  HCT 32.6* 31.2* 30.5* 31.8*  MCV 85.1 85.0 83.6 83.9  PLT 190 194 218 253    BNP (last 3 results) Recent Labs    08/07/19 1732  BNP 50.2    CBG: Recent Labs  Lab 08/09/19 0811 08/09/19 1135 08/09/19 1617 08/09/19 2100 08/10/19 0726  GLUCAP 298* 315* 276* 268* 109*    Time spent in discharge (includes decision making & examination of pt): 35 minutes  08/10/2019, 11:16 AM   Cherene Altes, MD [facility] Office  [phone]

## 2019-08-10 NOTE — Progress Notes (Signed)
The patient has rested well through the night. No complaints of pain or disccomfort. Covered blood sugars per sliding scale.

## 2019-08-10 NOTE — Evaluation (Addendum)
Occupational Therapy Evaluation Patient Details Name: Crystal [name] MRN: [id number] DOB: February 06, 1951 Today's Date: 08/10/2019    History of Present Illness 69yo w/ a hx of systolic CHF, CRT-D, DM, CKD stage IIIb, hypothyroidism, depression, insomnia, and chronic pain who presented to the ED w/ SOB, cough, fever, N/V, and anorexia. Her daughter was known to be COVID +. She presented to her PCP who found her sats to be in the 80s on RA w/ a + COVID test, and directed her to the ED. Pt admitted with COVID Pneumonia with acute hypoxic resp failure.    Clinical Impression   This 69 y/o female presents with the above. PTA pt reports independence with ADL and functional mobility, reports lives with children and grandchildren. Pt presenting with decreased activity tolerance, generalized fatigue and DOE with activity. Pt completing room level mobility without AD (x2 laps, seated rest, x2 more laps) overall at supervision level. She is currently at supervision - mod independent with LB and seated UB ADL. Pt grossly with 2/4 DOE throughout activity, completing on RA. SpO2 maintaining >90% with mobility tasks, once return to supine SpO2 briefly decreased to 86% though rebounding to >90% within approx 30 seconds. Issued energy conservation handout and reviewed. Pt will benefit from continued OT services while in acute setting to maximize her safety and independence with ADL and mobility. Do not anticipate pt will require follow up OT services after discharge.     Follow Up Recommendations  No OT follow up;Supervision - Intermittent    Equipment Recommendations  None recommended by OT           Precautions / Restrictions Precautions Precaution Comments: monitor O2 Restrictions Weight Bearing Restrictions: No      Mobility Bed Mobility Overal bed mobility: Modified Independent             General bed mobility comments: HOB slightly elevated  Transfers Overall transfer level: Modified  independent Equipment used: None                  Balance Overall balance assessment: No apparent balance deficits (not formally assessed)                                         ADL either performed or assessed with clinical judgement   ADL Overall ADL's : Needs assistance/impaired Eating/Feeding: Modified independent;Sitting   Grooming: Supervision/safety;Standing   Upper Body Bathing: Set up;Supervision/ safety;Sitting   Lower Body Bathing: Supervison/ safety;Sit to/from stand   Upper Body Dressing : Set up;Supervision/safety;Sitting   Lower Body Dressing: Supervision/safety;Sit to/from stand Lower Body Dressing Details (indicate cue type and reason): donning mesh underwear while seated EOB Toilet Transfer: Supervision/safety;Ambulation Toilet Transfer Details (indicate cue type and reason): simulated via transfer to/from EOB Toileting- Clothing Manipulation and Hygiene: Supervision/safety;Sit to/from Nurse, children's Details (indicate cue type and reason): discussed use of 3:1 as shower seat in tub initially for PRN use to increase safety during task completion and for further energy conservation  Functional mobility during ADLs: Supervision/safety General ADL Comments: pt with limitations due to fatigue/decreased endurance and increased DOE with activity                         Pertinent Vitals/Pain Pain Assessment: No/denies pain     Hand Dominance     Extremity/Trunk Assessment Upper Extremity Assessment Upper  Extremity Assessment: Overall WFL for tasks assessed   Lower Extremity Assessment Lower Extremity Assessment: Defer to PT evaluation   Cervical / Trunk Assessment Cervical / Trunk Assessment: Normal   Communication Communication Communication: No difficulties   Cognition Arousal/Alertness: Awake/alert Behavior During Therapy: WFL for tasks assessed/performed Overall Cognitive Status: Within Functional  Limits for tasks assessed                                     General Comments       Exercises Exercises: Other exercises Other Exercises Other Exercises: issued flutter valve, return demonstrating x5 Other Exercises: issued IS, performed x5, pulling overall 750-1037ml   Shoulder Instructions      Home Living Family/patient expects to be discharged to:: Private residence Living Arrangements: Children;Other relatives(grandchildren) Available Help at Discharge: Family(children typically work, 55 y/o grandson home all the time) Type of Home: House Home Access: Level entry     Home Layout: Two level Alternate Level Stairs-Number of Steps: flight Alternate Level Stairs-Rails: Left Bathroom Shower/Tub: Teacher, early years/pre: Handicapped height     Home Equipment: Environmental consultant - 2 wheels;Cane - single point;Bedside commode          Prior Functioning/Environment Level of Independence: Independent        Comments: driving, peforming some iADL tasks        OT Problem List: Decreased activity tolerance;Impaired balance (sitting and/or standing);Cardiopulmonary status limiting activity;Decreased knowledge of use of DME or AE      OT Treatment/Interventions: Self-care/ADL training;Therapeutic exercise;Energy conservation;DME and/or AE instruction;Therapeutic activities;Patient/family education;Balance training    OT Goals(Current goals can be found in the care plan section) Acute Rehab OT Goals Patient Stated Goal: home when able OT Goal Formulation: With patient Time For Goal Achievement: 08/24/19 Potential to Achieve Goals: Good  OT Frequency: Min 2X/week   Barriers to D/C:            Co-evaluation              AM-PAC OT "6 Clicks" Daily Activity     Outcome Measure Help from another person eating meals?: None Help from another person taking care of personal grooming?: None Help from another person toileting, which includes using  toliet, bedpan, or urinal?: None Help from another person bathing (including washing, rinsing, drying)?: A Little Help from another person to put on and taking off regular upper body clothing?: None Help from another person to put on and taking off regular lower body clothing?: None 6 Click Score: 23   End of Session Nurse Communication: Mobility status  Activity Tolerance: Patient tolerated treatment well Patient left: in bed;with call bell/phone within reach  OT Visit Diagnosis: Muscle weakness (generalized) (M62.81);Other (comment)(decreased activity tolerance)                Time: 9038-3338 OT Time Calculation (min): 28 min Charges:  OT General Charges $OT Visit: 1 Visit OT Evaluation $OT Eval Moderate Complexity: 1 Mod OT Treatments $Self Care/Home Management : 8-22 mins  Lou Cal, OT [facility] Pager [phone] Office [phone]   Raymondo Band 08/10/2019, 12:48 PM

## 2019-08-10 NOTE — Progress Notes (Signed)
Patient discharge from hospital AOx4 in high spirits.  She ambulates independently with no signs of distress.  RA saturations in the mid 90's.  Vitals WDL.  She has been educated about her follow-up care.  She will be visiting cardiologist for recalibrating and assessment of her pacemaker as scheduled.

## 2019-08-10 NOTE — Progress Notes (Signed)
Assessed patient this morning upon awakening.  Introduced myself.  Explained the course of therapy today including ambulation.  Patient is AOx4.  She ambulates independently to the toilet and back to bed.  She has a steady gate.  Vitals WDL.  Peripheral pulses good.  RA with saturation of 94 %

## 2019-08-10 NOTE — Discharge Instructions (Signed)
Date of Positive COVID Test: 08/07/19  Date Quarantine Ends: 08/28/2019    COVID-19 COVID-19 is a respiratory infection that is caused by a virus called severe acute respiratory syndrome coronavirus 2 (SARS-CoV-2). The disease is also known as coronavirus disease or novel coronavirus. In some people, the virus may not cause any symptoms. In others, it may cause a serious infection. The infection can get worse quickly and can lead to complications, such as:  Pneumonia, or infection of the lungs.  Acute respiratory distress syndrome or ARDS. This is a condition in which fluid build-up in the lungs prevents the lungs from filling with air and passing oxygen into the blood.  Acute respiratory failure. This is a condition in which there is not enough oxygen passing from the lungs to the body or when carbon dioxide is not passing from the lungs out of the body.  Sepsis or septic shock. This is a serious bodily reaction to an infection.  Blood clotting problems.  Secondary infections due to bacteria or fungus.  Organ failure. This is when your body's organs stop working. The virus that causes COVID-19 is contagious. This means that it can spread from person to person through droplets from coughs and sneezes (respiratory secretions). What are the causes? This illness is caused by a virus. You may catch the virus by:  Breathing in droplets from an infected person. Droplets can be spread by a person breathing, speaking, singing, coughing, or sneezing.  Touching something, like a table or a doorknob, that was exposed to the virus (contaminated) and then touching your mouth, nose, or eyes. What increases the risk? Risk for infection You are more likely to be infected with this virus if you:  Are within 6 feet (2 meters) of a person with COVID-19.  Provide care for or live with a person who is infected with COVID-19.  Spend time in crowded indoor spaces or live in shared housing. Risk for  serious illness You are more likely to become seriously ill from the virus if you:  Are 67 years of age or older. The higher your age, the more you are at risk for serious illness.  Live in a nursing home or long-term care facility.  Have cancer.  Have a long-term (chronic) disease such as: ? Chronic lung disease, including chronic obstructive pulmonary disease or asthma. ? A long-term disease that lowers your body's ability to fight infection (immunocompromised). ? Heart disease, including heart failure, a condition in which the arteries that lead to the heart become narrow or blocked (coronary artery disease), a disease which makes the heart muscle thick, weak, or stiff (cardiomyopathy). ? Diabetes. ? Chronic kidney disease. ? Sickle cell disease, a condition in which red blood cells have an abnormal "sickle" shape. ? Liver disease.  Are obese. What are the signs or symptoms? Symptoms of this condition can range from mild to severe. Symptoms may appear any time from 2 to 14 days after being exposed to the virus. They include:  A fever or chills.  A cough.  Difficulty breathing.  Headaches, body aches, or muscle aches.  Runny or stuffy (congested) nose.  A sore throat.  New loss of taste or smell. Some people may also have stomach problems, such as nausea, vomiting, or diarrhea. Other people may not have any symptoms of COVID-19. How is this diagnosed? This condition may be diagnosed based on:  Your signs and symptoms, especially if: ? You live in an area with a COVID-19 outbreak. ? You  recently traveled to or from an area where the virus is common. ? You provide care for or live with a person who was diagnosed with COVID-19. ? You were exposed to a person who was diagnosed with COVID-19.  A physical exam.  Lab tests, which may include: ? Taking a sample of fluid from the back of your nose and throat (nasopharyngeal fluid), your nose, or your throat using a  swab. ? A sample of mucus from your lungs (sputum). ? Blood tests.  Imaging tests, which may include, X-rays, CT scan, or ultrasound. How is this treated? At present, there is no medicine to treat COVID-19. Medicines that treat other diseases are being used on a trial basis to see if they are effective against COVID-19. Your health care provider will talk with you about ways to treat your symptoms. For most people, the infection is mild and can be managed at home with rest, fluids, and over-the-counter medicines. Treatment for a serious infection usually takes places in a hospital intensive care unit (ICU). It may include one or more of the following treatments. These treatments are given until your symptoms improve.  Receiving fluids and medicines through an IV.  Supplemental oxygen. Extra oxygen is given through a tube in the nose, a face mask, or a hood.  Positioning you to lie on your stomach (prone position). This makes it easier for oxygen to get into the lungs.  Continuous positive airway pressure (CPAP) or bi-level positive airway pressure (BPAP) machine. This treatment uses mild air pressure to keep the airways open. A tube that is connected to a motor delivers oxygen to the body.  Ventilator. This treatment moves air into and out of the lungs by using a tube that is placed in your windpipe.  Tracheostomy. This is a procedure to create a hole in the neck so that a breathing tube can be inserted.  Extracorporeal membrane oxygenation (ECMO). This procedure gives the lungs a chance to recover by taking over the functions of the heart and lungs. It supplies oxygen to the body and removes carbon dioxide. Follow these instructions at home: Lifestyle  If you are sick, stay home except to get medical care. Your health care provider will tell you how long to stay home. Call your health care provider before you go for medical care.  Rest at home as told by your health care provider.  Do  not use any products that contain nicotine or tobacco, such as cigarettes, e-cigarettes, and chewing tobacco. If you need help quitting, ask your health care provider.  Return to your normal activities as told by your health care provider. Ask your health care provider what activities are safe for you. General instructions  Take over-the-counter and prescription medicines only as told by your health care provider.  Drink enough fluid to keep your urine pale yellow.  Keep all follow-up visits as told by your health care provider. This is important. How is this prevented?  There is no vaccine to help prevent COVID-19 infection. However, there are steps you can take to protect yourself and others from this virus. To protect yourself:   Do not travel to areas where COVID-19 is a risk. The areas where COVID-19 is reported change often. To identify high-risk areas and travel restrictions, check the CDC travel website: FatFares.com.br  If you live in, or must travel to, an area where COVID-19 is a risk, take precautions to avoid infection. ? Stay away from people who are sick. ? Wash  your hands often with soap and water for 20 seconds. If soap and water are not available, use an alcohol-based hand sanitizer. ? Avoid touching your mouth, face, eyes, or nose. ? Avoid going out in public, follow guidance from your state and local health authorities. ? If you must go out in public, wear a cloth face covering or face mask. Make sure your mask covers your nose and mouth. ? Avoid crowded indoor spaces. Stay at least 6 feet (2 meters) away from others. ? Disinfect objects and surfaces that are frequently touched every day. This may include:  Counters and tables.  Doorknobs and light switches.  Sinks and faucets.  Electronics, such as phones, remote controls, keyboards, computers, and tablets. To protect others: If you have symptoms of COVID-19, take steps to prevent the virus from  spreading to others.  If you think you have a COVID-19 infection, contact your health care provider right away. Tell your health care team that you think you may have a COVID-19 infection.  Stay home. Leave your house only to seek medical care. Do not use public transport.  Do not travel while you are sick.  Wash your hands often with soap and water for 20 seconds. If soap and water are not available, use alcohol-based hand sanitizer.  Stay away from other members of your household. Let healthy household members care for children and pets, if possible. If you have to care for children or pets, wash your hands often and wear a mask. If possible, stay in your own room, separate from others. Use a different bathroom.  Make sure that all people in your household wash their hands well and often.  Cough or sneeze into a tissue or your sleeve or elbow. Do not cough or sneeze into your hand or into the air.  Wear a cloth face covering or face mask. Make sure your mask covers your nose and mouth. Where to find more information  Centers for Disease Control and Prevention: PurpleGadgets.be  World Health Organization: https://www.castaneda.info/ Contact a health care provider if:  You live in or have traveled to an area where COVID-19 is a risk and you have symptoms of the infection.  You have had contact with someone who has COVID-19 and you have symptoms of the infection. Get help right away if:  You have trouble breathing.  You have pain or pressure in your chest.  You have confusion.  You have bluish lips and fingernails.  You have difficulty waking from sleep.  You have symptoms that get worse. These symptoms may represent a serious problem that is an emergency. Do not wait to see if the symptoms will go away. Get medical help right away. Call your local emergency services (911 in the [place] Do not drive yourself to the hospital. Let the  emergency medical personnel know if you think you have COVID-19. Summary  COVID-19 is a respiratory infection that is caused by a virus. It is also known as coronavirus disease or novel coronavirus. It can cause serious infections, such as pneumonia, acute respiratory distress syndrome, acute respiratory failure, or sepsis.  The virus that causes COVID-19 is contagious. This means that it can spread from person to person through droplets from breathing, speaking, singing, coughing, or sneezing.  You are more likely to develop a serious illness if you are 25 years of age or older, have a weak immune system, live in a nursing home, or have chronic disease.  There is no medicine to treat COVID-19.  Your health care provider will talk with you about ways to treat your symptoms.  Take steps to protect yourself and others from infection. Wash your hands often and disinfect objects and surfaces that are frequently touched every day. Stay away from people who are sick and wear a mask if you are sick. This information is not intended to replace advice given to you by your health care provider. Make sure you discuss any questions you have with your health care provider. Document Revised: 05/10/2019 Document Reviewed: 08/16/2018 Elsevier Patient Education  2020 [place].   COVID-19: How to Protect Yourself and Others Know how it spreads  There is currently no vaccine to prevent coronavirus disease 2019 (COVID-19).  The best way to prevent illness is to avoid being exposed to this virus.  The virus is thought to spread mainly from person-to-person. ? Between people who are in close contact with one another (within about 6 feet). ? Through respiratory droplets produced when an infected person coughs, sneezes or talks. ? These droplets can land in the mouths or noses of people who are nearby or possibly be inhaled into the lungs. ? COVID-19 may be spread by people who are not showing  symptoms. Everyone should Clean your hands often  Wash your hands often with soap and water for at least 20 seconds especially after you have been in a public place, or after blowing your nose, coughing, or sneezing.  If soap and water are not readily available, use a hand sanitizer that contains at least 60% alcohol. Cover all surfaces of your hands and rub them together until they feel dry.  Avoid touching your eyes, nose, and mouth with unwashed hands. Avoid close contact  Limit contact with others as much as possible.  Avoid close contact with people who are sick.  Put distance between yourself and other people. ? Remember that some people without symptoms may be able to spread virus. ? This is especially important for people who are at higher risk of getting very GainPain.com.cy Cover your mouth and nose with a mask when around others  You could spread COVID-19 to others even if you do not feel sick.  Everyone should wear a mask in public settings and when around people not living in their household, especially when social distancing is difficult to maintain. ? Masks should not be placed on young children under age 90, anyone who has trouble breathing, or is unconscious, incapacitated or otherwise unable to remove the mask without assistance.  The mask is meant to protect other people in case you are infected.  Do NOT use a facemask meant for a Dietitian.  Continue to keep about 6 feet between yourself and others. The mask is not a substitute for social distancing. Cover coughs and sneezes  Always cover your mouth and nose with a tissue when you cough or sneeze or use the inside of your elbow.  Throw used tissues in the trash.  Immediately wash your hands with soap and water for at least 20 seconds. If soap and water are not readily available, clean your hands with a hand sanitizer that contains  at least 60% alcohol. Clean and disinfect  Clean AND disinfect frequently touched surfaces daily. This includes tables, doorknobs, light switches, countertops, handles, desks, phones, keyboards, toilets, faucets, and sinks. RackRewards.fr  If surfaces are dirty, clean them: Use detergent or soap and water prior to disinfection.  Then, use a household disinfectant. You can see a list of EPA-registered household  disinfectants here. michellinders.com 03/27/2019 This information is not intended to replace advice given to you by your health care provider. Make sure you discuss any questions you have with your health care provider. Document Revised: 04/04/2019 Document Reviewed: 01/31/2019 Elsevier Patient Education  [place].    COVID-19 Frequently Asked Questions COVID-19 (coronavirus disease) is an infection that is caused by a large family of viruses. Some viruses cause illness in people and others cause illness in animals like camels, cats, and bats. In some cases, the viruses that cause illness in animals can spread to humans. Where did the coronavirus come from? In December 2019, [place] told the [facility]) of several cases of lung disease (human respiratory illness). These cases were linked to an open seafood and livestock market in the city of [place]. The link to the seafood and livestock market suggests that the virus may have spread from animals to humans. However, since that first outbreak in December, the virus has also been shown to spread from person to person. What is the name of the disease and the virus? Disease name Early on, this disease was called novel coronavirus. This is because scientists determined that the disease was caused by a new (novel) respiratory virus. The World Health Organization [facility]) has now named the disease COVID-19, or coronavirus disease. Virus name The virus  that causes the disease is called severe acute respiratory syndrome coronavirus 2 (SARS-CoV-2). More information on disease and virus naming World Health Organization [facility]): www.who.int/emergencies/diseases/novel-coronavirus-2019/technical-guidance/naming-the-coronavirus-disease-(covid-2019)-and-the-virus-that-causes-it Who is at risk for complications from coronavirus disease? Some people may be at higher risk for complications from coronavirus disease. This includes older adults and people who have chronic diseases, such as heart disease, diabetes, and lung disease. If you are at higher risk for complications, take these extra precautions:  Stay home as much as possible.  Avoid social gatherings and travel.  Avoid close contact with others. Stay at least 6 ft (2 m) away from others, if possible.  Wash your hands often with soap and water for at least 20 seconds.  Avoid touching your face, mouth, nose, or eyes.  Keep supplies on hand at home, such as food, medicine, and cleaning supplies.  If you must go out in public, wear a cloth face covering or face mask. Make sure your mask covers your nose and mouth. How does coronavirus disease spread? The virus that causes coronavirus disease spreads easily from person to person (is contagious). You may catch the virus by:  Breathing in droplets from an infected person. Droplets can be spread by a person breathing, speaking, singing, coughing, or sneezing.  Touching something, like a table or a doorknob, that was exposed to the virus (contaminated) and then touching your mouth, nose, or eyes. Can I get the virus from touching surfaces or objects? There is still a lot that we do not know about the virus that causes coronavirus disease. Scientists are basing a lot of information on what they know about similar viruses, such as:  Viruses cannot generally survive on surfaces for long. They need a human body (host) to survive.  It is more likely  that the virus is spread by close contact with people who are sick (direct contact), such as through: ? Shaking hands or hugging. ? Breathing in respiratory droplets that travel through the air. Droplets can be spread by a person breathing, speaking, singing, coughing, or sneezing.  It is less likely that the virus is spread when a person touches a surface  or object that has the virus on it (indirect contact). The virus may be able to enter the body if the person touches a surface or object and then touches his or her face, eyes, nose, or mouth. Can a person spread the virus without having symptoms of the disease? It may be possible for the virus to spread before a person has symptoms of the disease, but this is most likely not the main way the virus is spreading. It is more likely for the virus to spread by being in close contact with people who are sick and breathing in the respiratory droplets spread by a person breathing, speaking, singing, coughing, or sneezing. What are the symptoms of coronavirus disease? Symptoms vary from person to person and can range from mild to severe. Symptoms may include:  Fever or chills.  Cough.  Difficulty breathing or feeling short of breath.  Headaches, body aches, or muscle aches.  Runny or stuffy (congested) nose.  Sore throat.  New loss of taste or smell.  Nausea, vomiting, or diarrhea. These symptoms can appear anywhere from 2 to 14 days after you have been exposed to the virus. Some people may not have any symptoms. If you develop symptoms, call your health care provider. People with severe symptoms may need hospital care. Should I be tested for this virus? Your health care provider will decide whether to test you based on your symptoms, history of exposure, and your risk factors. How does a health care provider test for this virus? Health care providers will collect samples to send for testing. Samples may include:  Taking a swab of fluid from  the back of your nose and throat, your nose, or your throat.  Taking fluid from the lungs by having you cough up mucus (sputum) into a sterile cup.  Taking a blood sample. Is there a treatment or vaccine for this virus? Currently, there is no vaccine to prevent coronavirus disease. Also, there are no medicines like antibiotics or antivirals to treat the virus. A person who becomes sick is given supportive care, which means rest and fluids. A person may also relieve his or her symptoms by using over-the-counter medicines that treat sneezing, coughing, and runny nose. These are the same medicines that a person takes for the common cold. If you develop symptoms, call your health care provider. People with severe symptoms may need hospital care. What can I do to protect myself and my family from this virus?     You can protect yourself and your family by taking the same actions that you would take to prevent the spread of other viruses. Take the following actions:  Wash your hands often with soap and water for at least 20 seconds. If soap and water are not available, use alcohol-based hand sanitizer.  Avoid touching your face, mouth, nose, or eyes.  Cough or sneeze into a tissue, sleeve, or elbow. Do not cough or sneeze into your hand or the air. ? If you cough or sneeze into a tissue, throw it away immediately and wash your hands.  Disinfect objects and surfaces that you frequently touch every day.  Stay away from people who are sick.  Avoid going out in public, follow guidance from your state and local health authorities.  Avoid crowded indoor spaces. Stay at least 6 ft (2 m) away from others.  If you must go out in public, wear a cloth face covering or face mask. Make sure your mask covers your nose and mouth.  Stay home if you are sick, except to get medical care. Call your health care provider before you get medical care. Your health care provider will tell you how long to stay  home.  Make sure your vaccines are up to date. Ask your health care provider what vaccines you need. What should I do if I need to travel? Follow travel recommendations from your local health authority, the CDC, and WHO. Travel information and advice  Centers for Disease Control and Prevention (CDC): BodyEditor.hu  World Health Organization [facility]): ThirdIncome.ca Know the risks and take action to protect your health  You are at higher risk of getting coronavirus disease if you are traveling to areas with an outbreak or if you are exposed to travelers from areas with an outbreak.  Wash your hands often and practice good hygiene to lower the risk of catching or spreading the virus. What should I do if I am sick? General instructions to stop the spread of infection  Wash your hands often with soap and water for at least 20 seconds. If soap and water are not available, use alcohol-based hand sanitizer.  Cough or sneeze into a tissue, sleeve, or elbow. Do not cough or sneeze into your hand or the air.  If you cough or sneeze into a tissue, throw it away immediately and wash your hands.  Stay home unless you must get medical care. Call your health care provider or local health authority before you get medical care.  Avoid public areas. Do not take public transportation, if possible.  If you can, wear a mask if you must go out of the house or if you are in close contact with someone who is not sick. Make sure your mask covers your nose and mouth. Keep your home clean  Disinfect objects and surfaces that are frequently touched every day. This may include: ? Counters and tables. ? Doorknobs and light switches. ? Sinks and faucets. ? Electronics such as phones, remote controls, keyboards, computers, and tablets.  Wash dishes in hot, soapy water or use a dishwasher. Air-dry your dishes.  Wash  laundry in hot water. Prevent infecting other household members  Let healthy household members care for children and pets, if possible. If you have to care for children or pets, wash your hands often and wear a mask.  Sleep in a different bedroom or bed, if possible.  Do not share personal items, such as razors, toothbrushes, deodorant, combs, brushes, towels, and washcloths. Where to find more information Centers for Disease Control and Prevention (CDC)  Information and news updates: https://www.butler-gonzalez.com/ World Health Organization [facility])  Information and news updates: MissExecutive.com.ee  Coronavirus health topic: https://www.castaneda.info/  Questions and answers on COVID-19: OpportunityDebt.at  Global tracker: who.sprinklr.com American Academy of Pediatrics (AAP)  Information for families: www.healthychildren.org/English/health-issues/conditions/chest-lungs/Pages/2019-Novel-Coronavirus.aspx The coronavirus situation is changing rapidly. Check your local health authority website or the CDC and [facility] websites for updates and news. When should I contact a health care provider?  Contact your health care provider if you have symptoms of an infection, such as fever or cough, and you: ? Have been near anyone who is known to have coronavirus disease. ? Have come into contact with a person who is suspected to have coronavirus disease. ? Have traveled to an area where there is an outbreak of COVID-19. When should I get emergency medical care?  Get help right away by calling your local emergency services (911 in the [place] if you have: ? Trouble breathing. ? Pain or  pressure in your chest. ? Confusion. ? Blue-tinged lips and fingernails. ? Difficulty waking from sleep. ? Symptoms that get worse. Let the emergency medical personnel know if you think you have coronavirus disease. Summary  A new  respiratory virus is spreading from person to person and causing COVID-19 (coronavirus disease).  The virus that causes COVID-19 appears to spread easily. It spreads from one person to another through droplets from breathing, speaking, singing, coughing, or sneezing.  Older adults and those with chronic diseases are at higher risk of disease. If you are at higher risk for complications, take extra precautions.  There is currently no vaccine to prevent coronavirus disease. There are no medicines, such as antibiotics or antivirals, to treat the virus.  You can protect yourself and your family by washing your hands often, avoiding touching your face, and covering your coughs and sneezes. This information is not intended to replace advice given to you by your health care provider. Make sure you discuss any questions you have with your health care provider. Document Revised: 05/10/2019 Document Reviewed: 11/06/2018 Elsevier Patient Education  [place].

## 2019-08-10 NOTE — Plan of Care (Signed)
The patient condition has improved. The patient is currently on room air and is tolerating the change very well. No need at this time to placed on 02.

## 2019-08-12 ENCOUNTER — Encounter (INDEPENDENT_AMBULATORY_CARE_PROVIDER_SITE_OTHER): Payer: Self-pay

## 2019-08-16 ENCOUNTER — Encounter (INDEPENDENT_AMBULATORY_CARE_PROVIDER_SITE_OTHER): Payer: Self-pay

## 2019-08-16 DIAGNOSIS — K219 Gastro-esophageal reflux disease without esophagitis: Secondary | ICD-10-CM | POA: Diagnosis not present

## 2019-08-16 DIAGNOSIS — F329 Major depressive disorder, single episode, unspecified: Secondary | ICD-10-CM | POA: Diagnosis not present

## 2019-08-16 DIAGNOSIS — M109 Gout, unspecified: Secondary | ICD-10-CM | POA: Diagnosis not present

## 2019-08-16 DIAGNOSIS — I10 Essential (primary) hypertension: Secondary | ICD-10-CM | POA: Diagnosis not present

## 2019-08-16 DIAGNOSIS — G4733 Obstructive sleep apnea (adult) (pediatric): Secondary | ICD-10-CM | POA: Diagnosis not present

## 2019-08-16 DIAGNOSIS — B373 Candidiasis of vulva and vagina: Secondary | ICD-10-CM | POA: Diagnosis not present

## 2019-08-16 DIAGNOSIS — R0901 Asphyxia: Secondary | ICD-10-CM | POA: Diagnosis not present

## 2019-08-16 DIAGNOSIS — N184 Chronic kidney disease, stage 4 (severe): Secondary | ICD-10-CM | POA: Diagnosis not present

## 2019-08-16 DIAGNOSIS — U071 COVID-19: Secondary | ICD-10-CM | POA: Diagnosis not present

## 2019-08-16 DIAGNOSIS — E1129 Type 2 diabetes mellitus with other diabetic kidney complication: Secondary | ICD-10-CM | POA: Diagnosis not present

## 2019-08-16 DIAGNOSIS — I509 Heart failure, unspecified: Secondary | ICD-10-CM | POA: Diagnosis not present

## 2019-08-16 DIAGNOSIS — Z95810 Presence of automatic (implantable) cardiac defibrillator: Secondary | ICD-10-CM | POA: Diagnosis not present

## 2019-08-17 ENCOUNTER — Emergency Department (HOSPITAL_COMMUNITY): Payer: Medicare Other

## 2019-08-17 ENCOUNTER — Inpatient Hospital Stay (HOSPITAL_COMMUNITY)
Admission: EM | Admit: 2019-08-17 | Discharge: 2019-08-21 | DRG: 394 | Disposition: A | Discharge status: Home / Self Care | Payer: Medicare Other | Attending: Internal Medicine | Admitting: Internal Medicine

## 2019-08-17 ENCOUNTER — Inpatient Hospital Stay (HOSPITAL_COMMUNITY): Payer: Medicare Other

## 2019-08-17 ENCOUNTER — Other Ambulatory Visit: Payer: Self-pay

## 2019-08-17 ENCOUNTER — Encounter (HOSPITAL_COMMUNITY): Payer: Self-pay | Admitting: Emergency Medicine

## 2019-08-17 ENCOUNTER — Encounter (INDEPENDENT_AMBULATORY_CARE_PROVIDER_SITE_OTHER): Payer: Self-pay

## 2019-08-17 DIAGNOSIS — K56609 Unspecified intestinal obstruction, unspecified as to partial versus complete obstruction: Secondary | ICD-10-CM

## 2019-08-17 DIAGNOSIS — E1365 Other specified diabetes mellitus with hyperglycemia: Secondary | ICD-10-CM

## 2019-08-17 DIAGNOSIS — K565 Intestinal adhesions [bands], unspecified as to partial versus complete obstruction: Secondary | ICD-10-CM | POA: Diagnosis present

## 2019-08-17 DIAGNOSIS — Z9841 Cataract extraction status, right eye: Secondary | ICD-10-CM

## 2019-08-17 DIAGNOSIS — Z95810 Presence of automatic (implantable) cardiac defibrillator: Secondary | ICD-10-CM

## 2019-08-17 DIAGNOSIS — N184 Chronic kidney disease, stage 4 (severe): Secondary | ICD-10-CM

## 2019-08-17 DIAGNOSIS — N390 Urinary tract infection, site not specified: Secondary | ICD-10-CM

## 2019-08-17 DIAGNOSIS — E039 Hypothyroidism, unspecified: Secondary | ICD-10-CM | POA: Diagnosis present

## 2019-08-17 DIAGNOSIS — Z6838 Body mass index (BMI) 38.0-38.9, adult: Secondary | ICD-10-CM

## 2019-08-17 DIAGNOSIS — Z888 Allergy status to other drugs, medicaments and biological substances status: Secondary | ICD-10-CM | POA: Diagnosis not present

## 2019-08-17 DIAGNOSIS — K5651 Intestinal adhesions [bands], with partial obstruction: Secondary | ICD-10-CM

## 2019-08-17 DIAGNOSIS — Z8616 Personal history of COVID-19: Secondary | ICD-10-CM | POA: Diagnosis not present

## 2019-08-17 DIAGNOSIS — K8020 Calculus of gallbladder without cholecystitis without obstruction: Secondary | ICD-10-CM | POA: Diagnosis present

## 2019-08-17 DIAGNOSIS — E875 Hyperkalemia: Secondary | ICD-10-CM

## 2019-08-17 DIAGNOSIS — F419 Anxiety disorder, unspecified: Secondary | ICD-10-CM | POA: Diagnosis present

## 2019-08-17 DIAGNOSIS — I447 Left bundle-branch block, unspecified: Secondary | ICD-10-CM | POA: Diagnosis present

## 2019-08-17 DIAGNOSIS — E279 Disorder of adrenal gland, unspecified: Secondary | ICD-10-CM | POA: Diagnosis present

## 2019-08-17 DIAGNOSIS — G8929 Other chronic pain: Secondary | ICD-10-CM | POA: Diagnosis present

## 2019-08-17 DIAGNOSIS — Z9049 Acquired absence of other specified parts of digestive tract: Secondary | ICD-10-CM

## 2019-08-17 DIAGNOSIS — R1084 Generalized abdominal pain: Secondary | ICD-10-CM | POA: Diagnosis not present

## 2019-08-17 DIAGNOSIS — R9431 Abnormal electrocardiogram [ECG] [EKG]: Secondary | ICD-10-CM | POA: Diagnosis present

## 2019-08-17 DIAGNOSIS — Z961 Presence of intraocular lens: Secondary | ICD-10-CM | POA: Diagnosis present

## 2019-08-17 DIAGNOSIS — K219 Gastro-esophageal reflux disease without esophagitis: Secondary | ICD-10-CM | POA: Diagnosis present

## 2019-08-17 DIAGNOSIS — E785 Hyperlipidemia, unspecified: Secondary | ICD-10-CM

## 2019-08-17 DIAGNOSIS — Z79891 Long term (current) use of opiate analgesic: Secondary | ICD-10-CM

## 2019-08-17 DIAGNOSIS — I428 Other cardiomyopathies: Secondary | ICD-10-CM

## 2019-08-17 DIAGNOSIS — I252 Old myocardial infarction: Secondary | ICD-10-CM

## 2019-08-17 DIAGNOSIS — Z79890 Hormone replacement therapy (postmenopausal): Secondary | ICD-10-CM | POA: Diagnosis not present

## 2019-08-17 DIAGNOSIS — Z803 Family history of malignant neoplasm of breast: Secondary | ICD-10-CM

## 2019-08-17 DIAGNOSIS — U071 COVID-19: Secondary | ICD-10-CM | POA: Diagnosis not present

## 2019-08-17 DIAGNOSIS — I5042 Chronic combined systolic (congestive) and diastolic (congestive) heart failure: Secondary | ICD-10-CM | POA: Diagnosis present

## 2019-08-17 DIAGNOSIS — R059 Cough, unspecified: Secondary | ICD-10-CM

## 2019-08-17 DIAGNOSIS — E871 Hypo-osmolality and hyponatremia: Secondary | ICD-10-CM | POA: Diagnosis present

## 2019-08-17 DIAGNOSIS — G4733 Obstructive sleep apnea (adult) (pediatric): Secondary | ICD-10-CM | POA: Diagnosis present

## 2019-08-17 DIAGNOSIS — J449 Chronic obstructive pulmonary disease, unspecified: Secondary | ICD-10-CM | POA: Diagnosis present

## 2019-08-17 DIAGNOSIS — R110 Nausea: Secondary | ICD-10-CM | POA: Diagnosis not present

## 2019-08-17 DIAGNOSIS — I10 Essential (primary) hypertension: Secondary | ICD-10-CM | POA: Diagnosis not present

## 2019-08-17 DIAGNOSIS — I255 Ischemic cardiomyopathy: Secondary | ICD-10-CM | POA: Diagnosis present

## 2019-08-17 DIAGNOSIS — Z818 Family history of other mental and behavioral disorders: Secondary | ICD-10-CM

## 2019-08-17 DIAGNOSIS — Z87898 Personal history of other specified conditions: Secondary | ICD-10-CM

## 2019-08-17 DIAGNOSIS — E1122 Type 2 diabetes mellitus with diabetic chronic kidney disease: Secondary | ICD-10-CM | POA: Diagnosis present

## 2019-08-17 DIAGNOSIS — K436 Other and unspecified ventral hernia with obstruction, without gangrene: Principal | ICD-10-CM | POA: Diagnosis present

## 2019-08-17 DIAGNOSIS — E1121 Type 2 diabetes mellitus with diabetic nephropathy: Secondary | ICD-10-CM

## 2019-08-17 DIAGNOSIS — Z825 Family history of asthma and other chronic lower respiratory diseases: Secondary | ICD-10-CM

## 2019-08-17 DIAGNOSIS — Z90710 Acquired absence of both cervix and uterus: Secondary | ICD-10-CM

## 2019-08-17 DIAGNOSIS — R05 Cough: Secondary | ICD-10-CM | POA: Diagnosis not present

## 2019-08-17 DIAGNOSIS — E119 Type 2 diabetes mellitus without complications: Secondary | ICD-10-CM | POA: Diagnosis not present

## 2019-08-17 DIAGNOSIS — I5022 Chronic systolic (congestive) heart failure: Secondary | ICD-10-CM

## 2019-08-17 DIAGNOSIS — Z0189 Encounter for other specified special examinations: Secondary | ICD-10-CM

## 2019-08-17 DIAGNOSIS — E1142 Type 2 diabetes mellitus with diabetic polyneuropathy: Secondary | ICD-10-CM | POA: Diagnosis present

## 2019-08-17 DIAGNOSIS — Z4682 Encounter for fitting and adjustment of non-vascular catheter: Secondary | ICD-10-CM | POA: Diagnosis not present

## 2019-08-17 DIAGNOSIS — Z9981 Dependence on supplemental oxygen: Secondary | ICD-10-CM

## 2019-08-17 DIAGNOSIS — I130 Hypertensive heart and chronic kidney disease with heart failure and stage 1 through stage 4 chronic kidney disease, or unspecified chronic kidney disease: Secondary | ICD-10-CM | POA: Diagnosis present

## 2019-08-17 DIAGNOSIS — Z8249 Family history of ischemic heart disease and other diseases of the circulatory system: Secondary | ICD-10-CM

## 2019-08-17 DIAGNOSIS — M109 Gout, unspecified: Secondary | ICD-10-CM | POA: Diagnosis present

## 2019-08-17 DIAGNOSIS — K5669 Other partial intestinal obstruction: Secondary | ICD-10-CM | POA: Diagnosis not present

## 2019-08-17 DIAGNOSIS — E1165 Type 2 diabetes mellitus with hyperglycemia: Secondary | ICD-10-CM | POA: Diagnosis present

## 2019-08-17 DIAGNOSIS — I2510 Atherosclerotic heart disease of native coronary artery without angina pectoris: Secondary | ICD-10-CM | POA: Diagnosis present

## 2019-08-17 DIAGNOSIS — N183 Chronic kidney disease, stage 3 unspecified: Secondary | ICD-10-CM

## 2019-08-17 DIAGNOSIS — I129 Hypertensive chronic kidney disease with stage 1 through stage 4 chronic kidney disease, or unspecified chronic kidney disease: Secondary | ICD-10-CM | POA: Diagnosis not present

## 2019-08-17 DIAGNOSIS — Z9842 Cataract extraction status, left eye: Secondary | ICD-10-CM

## 2019-08-17 DIAGNOSIS — Z79899 Other long term (current) drug therapy: Secondary | ICD-10-CM

## 2019-08-17 DIAGNOSIS — Z794 Long term (current) use of insulin: Secondary | ICD-10-CM

## 2019-08-17 DIAGNOSIS — N1832 Chronic kidney disease, stage 3b: Secondary | ICD-10-CM

## 2019-08-17 DIAGNOSIS — Z833 Family history of diabetes mellitus: Secondary | ICD-10-CM

## 2019-08-17 LAB — URINALYSIS, ROUTINE W REFLEX MICROSCOPIC
Bilirubin Urine: NEGATIVE
Glucose, UA: NEGATIVE mg/dL
Hgb urine dipstick: NEGATIVE
Ketones, ur: NEGATIVE mg/dL
Nitrite: NEGATIVE
Protein, ur: 30 mg/dL — AB
Specific Gravity, Urine: 1.014 (ref 1.005–1.030)
pH: 5 (ref 5.0–8.0)

## 2019-08-17 LAB — COMPREHENSIVE METABOLIC PANEL
ALT: 36 U/L (ref 0–44)
AST: 34 U/L (ref 15–41)
Albumin: 3.4 g/dL — ABNORMAL LOW (ref 3.5–5.0)
Alkaline Phosphatase: 128 U/L — ABNORMAL HIGH (ref 38–126)
Anion gap: 13 (ref 5–15)
BUN: 50 mg/dL — ABNORMAL HIGH (ref 8–23)
CO2: 24 mmol/L (ref 22–32)
Calcium: 9.9 mg/dL (ref 8.9–10.3)
Chloride: 94 mmol/L — ABNORMAL LOW (ref 98–111)
Creatinine, Ser: 1.98 mg/dL — ABNORMAL HIGH (ref 0.44–1.00)
GFR calc Af Amer: 29 mL/min — ABNORMAL LOW (ref >60)
GFR calc non Af Amer: 25 mL/min — ABNORMAL LOW (ref >60)
Glucose, Bld: 314 mg/dL — ABNORMAL HIGH (ref 70–99)
Potassium: 5.5 mmol/L — ABNORMAL HIGH (ref 3.5–5.1)
Sodium: 131 mmol/L — ABNORMAL LOW (ref 135–145)
Total Bilirubin: 1.4 mg/dL — ABNORMAL HIGH (ref 0.3–1.2)
Total Protein: 7.8 g/dL (ref 6.5–8.1)

## 2019-08-17 LAB — BASIC METABOLIC PANEL
Anion gap: 9 (ref 5–15)
BUN: 49 mg/dL — ABNORMAL HIGH (ref 8–23)
CO2: 25 mmol/L (ref 22–32)
Calcium: 9.5 mg/dL (ref 8.9–10.3)
Chloride: 94 mmol/L — ABNORMAL LOW (ref 98–111)
Creatinine, Ser: 1.97 mg/dL — ABNORMAL HIGH (ref 0.44–1.00)
GFR calc Af Amer: 29 mL/min — ABNORMAL LOW (ref >60)
GFR calc non Af Amer: 25 mL/min — ABNORMAL LOW (ref >60)
Glucose, Bld: 279 mg/dL — ABNORMAL HIGH (ref 70–99)
Potassium: 5.8 mmol/L — ABNORMAL HIGH (ref 3.5–5.1)
Sodium: 128 mmol/L — ABNORMAL LOW (ref 135–145)

## 2019-08-17 LAB — NA AND K (SODIUM & POTASSIUM), RAND UR
Potassium Urine: 51 mmol/L
Sodium, Ur: <10 mmol/L

## 2019-08-17 LAB — CK: Total CK: 45 U/L (ref 38–234)

## 2019-08-17 LAB — CBC
HCT: 37.1 % (ref 36.0–46.0)
Hemoglobin: 11.7 g/dL — ABNORMAL LOW (ref 12.0–15.0)
MCH: 26.1 pg (ref 26.0–34.0)
MCHC: 31.5 g/dL (ref 30.0–36.0)
MCV: 82.8 fL (ref 80.0–100.0)
Platelets: 344 10*3/uL (ref 150–400)
RBC: 4.48 MIL/uL (ref 3.87–5.11)
RDW: 16.1 % — ABNORMAL HIGH (ref 11.5–15.5)
WBC: 11 10*3/uL — ABNORMAL HIGH (ref 4.0–10.5)
nRBC: 0 % (ref 0.0–0.2)

## 2019-08-17 LAB — OSMOLALITY, URINE: Osmolality, Ur: 428 mOsm/kg (ref 300–900)

## 2019-08-17 LAB — CREATININE, URINE, RANDOM: Creatinine, Urine: 156.63 mg/dL

## 2019-08-17 LAB — RESPIRATORY PANEL BY RT PCR (FLU A&B, COVID)
Influenza A by PCR: NEGATIVE
Influenza B by PCR: NEGATIVE
SARS Coronavirus 2 by RT PCR: NEGATIVE

## 2019-08-17 LAB — HEMOGLOBIN A1C
Hgb A1c MFr Bld: 9.1 % — ABNORMAL HIGH (ref 4.8–5.6)
Mean Plasma Glucose: 214.47 mg/dL

## 2019-08-17 LAB — SODIUM, URINE, RANDOM: Sodium, Ur: <10 mmol/L

## 2019-08-17 LAB — CBG MONITORING, ED: Glucose-Capillary: 248 mg/dL — ABNORMAL HIGH (ref 70–99)

## 2019-08-17 LAB — LIPASE, BLOOD: Lipase: 31 U/L (ref 11–51)

## 2019-08-17 LAB — LACTIC ACID, PLASMA: Lactic Acid, Venous: 1.7 mmol/L (ref 0.5–1.9)

## 2019-08-17 MED ORDER — ONDANSETRON HCL 4 MG/2ML IJ SOLN: 4.0000 mg | Freq: Four times a day (QID) | INTRAMUSCULAR | Status: DC | PRN

## 2019-08-17 MED ORDER — DIATRIZOATE MEGLUMINE & SODIUM 66-10 % PO SOLN
90.0000 mL | Freq: Once | ORAL | Status: AC
  Administered 2019-08-17: 90 mL via NASOGASTRIC
  Filled 2019-08-17 (×2): qty 90

## 2019-08-17 MED ORDER — ONDANSETRON HCL 4 MG/2ML IJ SOLN
4.0000 mg | Freq: Once | INTRAMUSCULAR | Status: AC
  Administered 2019-08-17: 4 mg via INTRAVENOUS
  Filled 2019-08-17: qty 2

## 2019-08-17 MED ORDER — SODIUM CHLORIDE 0.9 % IV SOLN
1.0000 g | Freq: Once | INTRAVENOUS | Status: AC
  Administered 2019-08-17: 1 g via INTRAVENOUS
  Filled 2019-08-17: qty 10

## 2019-08-17 MED ORDER — SODIUM CHLORIDE 0.9 % IV BOLUS
250.0000 mL | Freq: Once | INTRAVENOUS | Status: AC
  Administered 2019-08-17: 250 mL via INTRAVENOUS

## 2019-08-17 MED ORDER — SODIUM CHLORIDE 0.9 % IV SOLN
1.0000 g | INTRAVENOUS | Status: DC
  Administered 2019-08-18: 1 g via INTRAVENOUS
  Filled 2019-08-17: qty 10

## 2019-08-17 MED ORDER — ACETAMINOPHEN 325 MG PO TABS: 650.0000 mg | ORAL_TABLET | Freq: Four times a day (QID) | ORAL | Status: DC | PRN

## 2019-08-17 MED ORDER — SODIUM CHLORIDE 0.9 % IV SOLN: INTRAVENOUS | Status: AC

## 2019-08-17 MED ORDER — HYDROCODONE-ACETAMINOPHEN 5-325 MG PO TABS: 1.0000 | ORAL_TABLET | ORAL | Status: DC | PRN

## 2019-08-17 MED ORDER — IPRATROPIUM BROMIDE HFA 17 MCG/ACT IN AERS
2.0000 | INHALATION_SPRAY | Freq: Four times a day (QID) | RESPIRATORY_TRACT | Status: DC
  Administered 2019-08-17 – 2019-08-21 (×15): 2 via RESPIRATORY_TRACT
  Filled 2019-08-17: qty 12.9

## 2019-08-17 MED ORDER — INSULIN ASPART 100 UNIT/ML IV SOLN
10.0000 [IU] | Freq: Once | INTRAVENOUS | Status: AC
  Administered 2019-08-17: 10 [IU] via INTRAVENOUS

## 2019-08-17 MED ORDER — INSULIN ASPART 100 UNIT/ML ~~LOC~~ SOLN
0.0000 [IU] | SUBCUTANEOUS | Status: DC
  Administered 2019-08-17: 5 [IU] via SUBCUTANEOUS
  Administered 2019-08-18: 3 [IU] via SUBCUTANEOUS
  Administered 2019-08-18: 1 [IU] via SUBCUTANEOUS
  Administered 2019-08-18: 3 [IU] via SUBCUTANEOUS
  Administered 2019-08-18: 2 [IU] via SUBCUTANEOUS
  Administered 2019-08-18: 5 [IU] via SUBCUTANEOUS
  Administered 2019-08-18: 2 [IU] via SUBCUTANEOUS
  Administered 2019-08-19: 1 [IU] via SUBCUTANEOUS
  Administered 2019-08-19: 2 [IU] via SUBCUTANEOUS
  Administered 2019-08-19 – 2019-08-20 (×4): 3 [IU] via SUBCUTANEOUS
  Administered 2019-08-20 (×3): 2 [IU] via SUBCUTANEOUS
  Administered 2019-08-20: 1 [IU] via SUBCUTANEOUS
  Administered 2019-08-21: 7 [IU] via SUBCUTANEOUS
  Administered 2019-08-21 (×2): 2 [IU] via SUBCUTANEOUS
  Administered 2019-08-21: 5 [IU] via SUBCUTANEOUS

## 2019-08-17 MED ORDER — DEXTROSE 50 % IV SOLN
1.0000 | Freq: Once | INTRAVENOUS | Status: AC
  Administered 2019-08-17: 50 mL via INTRAVENOUS
  Filled 2019-08-17: qty 50

## 2019-08-17 MED ORDER — ONDANSETRON HCL 4 MG PO TABS: 4.0000 mg | ORAL_TABLET | Freq: Four times a day (QID) | ORAL | Status: DC | PRN

## 2019-08-17 MED ORDER — SODIUM CHLORIDE 0.9% FLUSH: 3.0000 mL | Freq: Once | INTRAVENOUS | Status: DC

## 2019-08-17 MED ORDER — INSULIN GLARGINE 100 UNIT/ML ~~LOC~~ SOLN
50.0000 [IU] | Freq: Two times a day (BID) | SUBCUTANEOUS | Status: DC
  Administered 2019-08-17 – 2019-08-18 (×2): 50 [IU] via SUBCUTANEOUS
  Filled 2019-08-17 (×3): qty 0.5

## 2019-08-17 MED ORDER — SODIUM CHLORIDE 0.9 % IV BOLUS: 500.0000 mL | Freq: Once | INTRAVENOUS | Status: DC

## 2019-08-17 MED ORDER — ACETAMINOPHEN 650 MG RE SUPP: 650.0000 mg | Freq: Four times a day (QID) | RECTAL | Status: DC | PRN

## 2019-08-17 NOTE — ED Notes (Signed)
Pt ambulated to bathroom unassisted.

## 2019-08-17 NOTE — ED Triage Notes (Signed)
C/o stabbing, lower abd pain with nausea since yesterday.  Pt belching during triage exam.  States it is because she just had a soda.  Last BM yesterday morning.  Reports that she normally has 6 per day.

## 2019-08-17 NOTE — Consult Note (Addendum)
Reason for Consult/Chief Complaint: small bowel obstruction Referring Provider: Ashok Cordia, MD  Crystal [name] is an 69 y.o. female.   HPI: 7F with multiple medical problems and had a recent diagnosis of COVID on 08/07/2019.  She presents with lower abdominal pain and nausea since 08/16/2019.  She reports that her symptoms are similar to prior small bowel obstructions she has had.  Her recent complicated admission was January 13-16 and she was treated with  Decadron and remdesivir and did require mechanical ventilation during hospitalization.  Since admission she has had an NG tube placed and has had 3 bowel movements.  Past Medical History:  Diagnosis Date  . AICD (automatic cardioverter/defibrillator) present    high RV threshold chronically, device was turned off in 2014; Device battery has been dead x 7 years; "turned it back on 06/22/2018"  . Anemia, iron deficiency   . Angioedema    felt to likely be due to ace inhibitors but says she has had this even off of medicines, appears to be tolerating ARBs chronically  . Anxiety   . Arthritis    "hands, legs, arms; bad in my back" (06/22/2018)  . Asthmatic bronchitis with status asthmaticus   . Bradycardia   . CHF (congestive heart failure) ([place])   . Chronic lower back pain   . Chronic pain   . Chronic renal insufficiency   . CKD (chronic kidney disease), stage III   . Coronary artery disease    Mild nonobstructive (30% LAD, 30% RCA) by 09/01/16 cath at [facility]  . Degenerative joint disease   . Depression   . Diabetes mellitus, type 2 ([place])   . Diabetic peripheral neuropathy ([place])   . Dizziness   . Dyspnea on exertion   . Family history of adverse reaction to anesthesia    Mother has nausea  . GERD (gastroesophageal reflux disease)   . Gout   . Heart valve disorder   . History of blood transfusion 1981; ~ 2005; 12/2016   "childbirth; defibrillator OR; colostomy OR"  . History of mononucleosis 03/2014  . Hyperlipidemia    . Hypertension   . Hypothyroid   . Insomnia   . Intervertebral disc degeneration   . Ischemic cardiomyopathy   . LBBB (left bundle branch block)   . Myocardial infarction ([place]) dx'd ~ 2005  . Nonischemic cardiomyopathy ([place])    s/p ICD in 2005. EF has since recovered  . Obesity   . On home oxygen therapy    "2L at night" (06/22/2018)  . OSA on CPAP   . Pneumonia    "couple times; last time was 12/2016" (06/22/2018)  . PONV (postoperative nausea and vomiting)   . Presence of permanent cardiac pacemaker    [employer]  . Swelling   . Syncope   . Systemic hypertension   . Vitamin D deficiency     Past Surgical History:  Procedure Laterality Date  . APPENDECTOMY  07/13/2017  . BLADDER SUSPENSION  1990   "w/hysterectomy"  . BOWEL RESECTION N/A 07/09/2018   Procedure: SMALL BOWEL RESECTION;  Surgeon: Judeth Horn, MD;  Location: [place];  Service: General;  Laterality: N/A;  . CARDIAC CATHETERIZATION  2005   no obstructive CAD per patient  . CARDIAC CATHETERIZATION  2018  . CARDIAC DEFIBRILLATOR PLACEMENT  2005   BiV ICD implanted,  LV lead is an epicardial lead  . CARPAL TUNNEL RELEASE Left 07/25/2014   Dr.Williamson   . CARPAL TUNNEL RELEASE Right 04/08/2019  Procedure: RIGHT CARPAL TUNNEL RELEASE ENDOSCOPIC;  Surgeon: Milly Jakob, MD;  Location: [facility];  Service: Orthopedics;  Laterality: Right;  . CATARACT EXTRACTION W/ INTRAOCULAR LENS  IMPLANT, BILATERAL Bilateral   . COLONIC STENT PLACEMENT N/A 08/31/2017   Procedure: COLONIC STENT PLACEMENT;  Surgeon: Carol Ada, MD;  Location: [facility];  Service: Endoscopy;  Laterality: N/A;  . COLONOSCOPY     2010-2011 Dr.Kipreos   . COLOSTOMY  12/2016   Archie Endo 01/26/2017  . COLOSTOMY REVERSAL N/A 07/13/2017   Procedure: COLOSTOMY REVERSAL;  Surgeon: Judeth Horn, MD;  Location: [facility];  Service: General;  Laterality: N/A;  . CYST REMOVAL HAND Right 05/2013   thumb  . DILATION AND CURETTAGE OF UTERUS  X 5-6  .  EXCISION MASS ABDOMINAL N/A 07/09/2018   Procedure: EXPLORATION OF  ABDOMINAL WOUND ERAS PATHWAY;  Surgeon: Judeth Horn, MD;  Location: [facility];  Service: General;  Laterality: N/A;  . FLEXIBLE SIGMOIDOSCOPY N/A 08/24/2017   Procedure: Beryle Quant;  Surgeon: Carol Ada, MD;  Location: [facility];  Service: Endoscopy;  Laterality: N/A;  . FLEXIBLE SIGMOIDOSCOPY N/A 08/31/2017   Procedure: FLEXIBLE SIGMOIDOSCOPY;  Surgeon: Carol Ada, MD;  Location: [facility];  Service: Endoscopy;  Laterality: N/A;  stent placement  . IMPLANTABLE CARDIOVERTER DEFIBRILLATOR GENERATOR CHANGE  2008  . INSERTION OF MESH N/A 07/09/2018   Procedure: INSERTION OF VICRYL MESH;  Surgeon: Judeth Horn, MD;  Location: [facility];  Service: General;  Laterality: N/A;  . LAPAROTOMY N/A 09/18/2017   Procedure: EXPLORATORY LAPAROTOMY;  Surgeon: Judeth Horn, MD;  Location: [facility];  Service: General;  Laterality: N/A;  . LEAD REVISION  06/22/2018  . LEAD REVISION/REPAIR N/A 06/22/2018   Procedure: LEAD REVISION/REPAIR;  Surgeon: Deboraha Sprang, MD;  Location: [facility];  Service: Cardiovascular;  Laterality: N/A;  . LYSIS OF ADHESION N/A 09/18/2017   Procedure: LYSIS OF ADHESION;  Surgeon: Judeth Horn, MD;  Location: [facility];  Service: General;  Laterality: N/A;  . LYSIS OF ADHESION N/A 07/09/2018   Procedure: LYSIS OF ADHESION;  Surgeon: Judeth Horn, MD;  Location: [facility];  Service: General;  Laterality: N/A;  . PARTIAL COLECTOMY N/A 09/18/2017   Procedure: ILEOCOLECTOMY;  Surgeon: Judeth Horn, MD;  Location: [facility];  Service: General;  Laterality: N/A;  . PARTIAL COLECTOMY  09/25/2017  . RIGHT/LEFT HEART CATH AND CORONARY ANGIOGRAPHY N/A 06/01/2018   Procedure: RIGHT/LEFT HEART CATH AND CORONARY ANGIOGRAPHY;  Surgeon: Jettie Booze, MD;  Location: [facility];  Service: Cardiovascular;  Laterality: N/A;  . SIGMOIDOSCOPY N/A 09/18/2017   Procedure: SIGMOIDOSCOPY;  Surgeon: Judeth Horn, MD;   Location: [facility];  Service: General;  Laterality: N/A;  . SMALL INTESTINE SURGERY  07/09/2018   EXPLORATION OF  ABDOMINAL WOUND ERAS PATHWAYN/; MESH; LYSIS OF ADHESIONS  . TOTAL ABDOMINAL HYSTERECTOMY  1990  . TRIGGER FINGER RELEASE Right 05/213  . TRIGGER FINGER RELEASE Right 04/08/2019   Procedure: RIGHT INDEX RELEASE TRIGGER FINGER/A-1 PULLEY;  Surgeon: Milly Jakob, MD;  Location: [facility];  Service: Orthopedics;  Laterality: Right;  . TUBAL LIGATION  1980s  . VESICOVAGINAL FISTULA CLOSURE W/ TAH      Family History  Problem Relation Age of Onset  . Hypertension Father   . Heart disease Father   . Cancer Father        prostate  . Heart disease Other        (Maternal side) Ischemic heart disease  . Diabetes Mellitus II Other   .  Arrhythmia Mother   . Diabetes Mellitus II Mother        Borderline DM  . Hypertension Mother   . Asthma Mother   . Cancer Brother   . Dementia Brother   . Hypertension Brother   . Hypertension Daughter   . Hypertension Daughter   . Diabetes Daughter   . Hypertension Daughter   . Atrial fibrillation Daughter   . GER disease Daughter   . Hypertension Son   . Anxiety disorder Son   . Hypothyroidism Son   . Breast cancer Maternal Grandmother     Social History:  reports that she has never smoked. She has never used smokeless tobacco. She reports that she does not drink alcohol or use drugs.  Allergies:  Allergies  Allergen Reactions  . Ace Inhibitors Swelling and Other (See Comments)    Angioedema  . Glucophage [Metformin Hcl] Other (See Comments)    Renal failure  . Telmisartan Other (See Comments)    AVOID ARB/ ACEi in this patient due to recurrent AKI  . Advicor [Niacin-Lovastatin Er] Other (See Comments)    Muscle aches  . Bystolic [Nebivolol Hcl] Swelling and Other (See Comments)    UNSPECIFIED SWELLING/EDEMA  . Erythromycin Diarrhea, Nausea And Vomiting and Other (See Comments)    *DERIVATIVES*  . Lipitor [Atorvastatin] Other (See  Comments)    Muscle aches  . Lopid [Gemfibrozil] Other (See Comments)    Muscle aches  . Statins Other (See Comments)    Muscle aches tolerates crestor    Medications: I have reviewed the patient's current medications.  Results for orders placed or performed during the hospital encounter of 08/17/19 (from the past 48 hour(s))  Lipase, blood     Status: None   Collection Time: 08/17/19 12:32 PM  Result Value Ref Range   Lipase 31 11 - 51 U/L    Comment: Performed at [facility], [place] [address], [place],  [address]  Comprehensive metabolic panel     Status: Abnormal   Collection Time: 08/17/19 12:32 PM  Result Value Ref Range   Sodium 131 (L) 135 - 145 mmol/L   Potassium 5.5 (H) 3.5 - 5.1 mmol/L   Chloride 94 (L) 98 - 111 mmol/L   CO2 24 22 - 32 mmol/L   Glucose, Bld 314 (H) 70 - 99 mg/dL   BUN 50 (H) 8 - 23 mg/dL   Creatinine, Ser 1.98 (H) 0.44 - 1.00 mg/dL   Calcium 9.9 8.9 - 10.3 mg/dL   Total Protein 7.8 6.5 - 8.1 g/dL   Albumin 3.4 (L) 3.5 - 5.0 g/dL   AST 34 15 - 41 U/L   ALT 36 0 - 44 U/L   Alkaline Phosphatase 128 (H) 38 - 126 U/L   Total Bilirubin 1.4 (H) 0.3 - 1.2 mg/dL   GFR calc non Af Amer 25 (L) >60 mL/min   GFR calc Af Amer 29 (L) >60 mL/min   Anion gap 13 5 - 15    Comment: Performed at [facility], [place] [address] [place] [address]  CBC     Status: Abnormal   Collection Time: 08/17/19 12:32 PM  Result Value Ref Range   WBC 11.0 (H) 4.0 - 10.5 K/uL   RBC 4.48 3.87 - 5.11 MIL/uL   Hemoglobin 11.7 (L) 12.0 - 15.0 g/dL   HCT 37.1 36.0 - 46.0 %   MCV 82.8 80.0 - 100.0 fL   MCH 26.1 26.0 - 34.0 pg  MCHC 31.5 30.0 - 36.0 g/dL   RDW 16.1 (H) 11.5 - 15.5 %   Platelets 344 150 - 400 K/uL   nRBC 0.0 0.0 - 0.2 %    Comment: Performed at [facility], [place] [address] [place], [place] [address]  Lactic acid, plasma     Status: None   Collection Time: 08/17/19  1:16 PM  Result Value Ref Range   Lactic Acid, Venous 1.7 0.5 -  1.9 mmol/L    Comment: Performed at [facility] [address], [place], [place] [address]  Urinalysis, Routine w reflex microscopic     Status: Abnormal   Collection Time: 08/17/19  1:49 PM  Result Value Ref Range   Color, Urine YELLOW YELLOW   APPearance CLOUDY (A) CLEAR   Specific Gravity, Urine 1.014 1.005 - 1.030   pH 5.0 5.0 - 8.0   Glucose, UA NEGATIVE NEGATIVE mg/dL   Hgb urine dipstick NEGATIVE NEGATIVE   Bilirubin Urine NEGATIVE NEGATIVE   Ketones, ur NEGATIVE NEGATIVE mg/dL   Protein, ur 30 (A) NEGATIVE mg/dL   Nitrite NEGATIVE NEGATIVE   Leukocytes,Ua LARGE (A) NEGATIVE   RBC / HPF 21-50 0 - 5 RBC/hpf   WBC, UA 21-50 0 - 5 WBC/hpf   Bacteria, UA RARE (A) NONE SEEN   Squamous Epithelial / LPF 21-50 0 - 5   Hyaline Casts, UA PRESENT    Non Squamous Epithelial 0-5 (A) NONE SEEN    Comment: Performed at [facility], [place] [address], [place], [place] [address]    CT ABDOMEN PELVIS WO CONTRAST  Result Date: 08/17/2019 CLINICAL DATA:  Lower abdominal pain with nausea. EXAM: CT ABDOMEN AND PELVIS WITHOUT CONTRAST TECHNIQUE: Multidetector CT imaging of the abdomen and pelvis was performed following the standard protocol without IV contrast. COMPARISON:  April 17, 2018 FINDINGS: Lower chest: Small patchy areas of atelectasis and/or infiltrate are seen within the bilateral lung bases. Hepatobiliary: No focal liver abnormality is seen. A subcentimeter gallstone is seen within the lumen of an otherwise normal-appearing gallbladder. Pancreas: Unremarkable. No pancreatic ductal dilatation or surrounding inflammatory changes. Spleen: Normal in size without focal abnormality. Adrenals/Urinary Tract: There is a stable 1.9 cm x 1.5 cm low-attenuation right adrenal mass. The left adrenal gland is normal in appearance. Kidneys are normal, without renal calculi, focal lesion, or hydronephrosis. Bladder is unremarkable. Stomach/Bowel: Stomach is within normal limits. Surgically  anastomosed bowel is seen within the anterior aspect of the lower abdomen, along the midline. Multiple dilated small bowel loops are seen (maximum small bowel diameter of approximately 3.8 cm). These dilated loops extend into a 3.8 cm x 2.0 cm ventral hernia seen along the lower pelvic wall, to the left of midline (axial CT image 77, CT series number 3). An additional 5.9 cm x 4.5 cm ventral hernia is seen along the lower pelvic wall on the right. This contains a dilated loop of small bowel. The loop of small bowel that exits this hernia is normal in caliber (axial CT images 68 through 80, CT series number 3). Vascular/Lymphatic: Moderate severity aortic atherosclerosis. No enlarged abdominal or pelvic lymph nodes. Reproductive: Status post hysterectomy. No adnexal masses. Other: No abdominopelvic ascites. Musculoskeletal: Multilevel degenerative changes seen throughout the lumbar spine. IMPRESSION: 1. Distal small bowel obstruction secondary to the presence of ventral hernia located within the anterior aspect of the lower pelvic wall on the right. 2. Cholelithiasis. 3. Stable low-attenuation right adrenal mass which may represent an adrenal adenoma.  Electronically Signed   By: Virgina Norfolk M.D.   On: 08/17/2019 17:05    ROS 10 point review of systems is negative except as listed above in HPI.   Physical Exam Blood pressure (!) 112/58, pulse 80, temperature 98 F (36.7 C), temperature source Oral, resp. rate 15, height 5\' 5"  (1.651 m), weight 105.2 kg, SpO2 94 %.  Gen: comfortable, no distress Neuro: non-focal exam HEENT: PERRL Neck: supple CV: RRR Pulm: unlabored breathing Abd: soft, minimally TTP at inferior aspect of midline incision, hernia is soft and easily reducible Extr: wwp, no edema     Assessment/Plan: 43F with SBO  Recommend continuation of small bowel protocol will evaluate abdominal x-ray film this morning.  Trial of NG tube clamping today.   Jesusita Oka,  MD General and [facility]

## 2019-08-17 NOTE — Progress Notes (Signed)
Patient arrived to 5w34 from ED. NG tube to Right nares to suction. Nose tape replaced with nose piece for NG tube. Patient alert and oriented. No complaints of pain. Patient has multiple hernias on stomach. Scattered bruising noted throughout abdominal area. Clothing, purse, cell phone, and charger at bedside. Call light in place. Vital signs WDL. Will continue to monitor.

## 2019-08-17 NOTE — ED Provider Notes (Signed)
Signed out by Dr Laverta [place] to check CT when resulted.   CT c/w SBO/ventral hernia.   On exam, pt does have multiple soft, not tense, ventral hernias which appear to be reducible. No peritoneal signs.   Gen surgery consulted, discussed pt and CT - they will consult, and request NG and medicine admission (as pt with uncontrolled dm, CKD, UTI, hyperkalemia, etc).   Medicine service consulted for admission.     Lajean Saver, MD 08/17/19 [phone]

## 2019-08-17 NOTE — ED Provider Notes (Signed)
Emergency Department Provider Note   I have reviewed the triage vital signs and the nursing notes.   HISTORY  Chief Complaint Abdominal Pain   HPI Crystal [name] is a 69 y.o. female with PMH of CHF, CKD, DM, and complicated past surgical history presents to the ED with worsening pain over the mid abdomen over the past several days.  She is had nausea without vomiting and is no longer passing stool and minimal flatus from below.  She recently was released from the hospital after developing shortness of breath and hypoxemia from COVID-19.  She states that those symptoms have improved significantly.  She is not experiencing chest pain.  She denies fever with her abdominal pain.  She does have history of bowel obstruction and is concerned that this could be going on today.    Past Medical History:  Diagnosis Date  . AICD (automatic cardioverter/defibrillator) present    high RV threshold chronically, device was turned off in 2014; Device battery has been dead x 7 years; "turned it back on 06/22/2018"  . Anemia, iron deficiency   . Angioedema    felt to likely be due to ace inhibitors but says she has had this even off of medicines, appears to be tolerating ARBs chronically  . Anxiety   . Arthritis    "hands, legs, arms; bad in my back" (06/22/2018)  . Asthmatic bronchitis with status asthmaticus   . Bradycardia   . CHF (congestive heart failure) ([place])   . Chronic lower back pain   . Chronic pain   . Chronic renal insufficiency   . CKD (chronic kidney disease), stage III   . Coronary artery disease    Mild nonobstructive (30% LAD, 30% RCA) by 09/01/16 cath at [facility]  . Degenerative joint disease   . Depression   . Diabetes mellitus, type 2 ([place])   . Diabetic peripheral neuropathy ([place])   . Dizziness   . Dyspnea on exertion   . Family history of adverse reaction to anesthesia    Mother has nausea  . GERD (gastroesophageal reflux disease)   . Gout   . Heart valve  disorder   . History of blood transfusion 1981; ~ 2005; 12/2016   "childbirth; defibrillator OR; colostomy OR"  . History of mononucleosis 03/2014  . Hyperlipidemia   . Hypertension   . Hypothyroid   . Insomnia   . Intervertebral disc degeneration   . Ischemic cardiomyopathy   . LBBB (left bundle branch block)   . Myocardial infarction ([place]) dx'd ~ 2005  . Nonischemic cardiomyopathy ([place])    s/p ICD in 2005. EF has since recovered  . Obesity   . On home oxygen therapy    "2L at night" (06/22/2018)  . OSA on CPAP   . Pneumonia    "couple times; last time was 12/2016" (06/22/2018)  . PONV (postoperative nausea and vomiting)   . Presence of permanent cardiac pacemaker    [employer]  . Swelling   . Syncope   . Systemic hypertension   . Vitamin D deficiency     Patient Active Problem List   Diagnosis Date Noted  . Acute hypoxemic respiratory failure due to COVID-19 ([place]) 08/07/2019  . Normocytic anemia 08/07/2019  . 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 Congestive heart failure (CHF) ([place]) 06/22/2018  . Acute systolic heart failure ([place])   . Type 2 diabetes mellitus with stage 3 chronic kidney disease, with [name] current use of insulin ([place]) 11/22/2017  . Acute  respiratory failure with hypoxia ([place]) 11/22/2017  . Bowel obstruction ([place]) 08/23/2017  . Colonic obstruction ([place]) 08/22/2017  . Dehydration   . Intractable vomiting with nausea   . Hyperkalemia   . Gastroenteritis 08/11/2017  . Hypotension 08/11/2017  . Acute renal failure superimposed on stage 3 chronic kidney disease ([place]) 08/11/2017  . Hypovolemia 08/10/2017  . Colostomy in place [facility]) 07/13/2017  . Adjustment disorder with depressed mood   . Debilitated 02/06/2017  . S/P colostomy ([place]) 01/31/2017  . Pressure injury of skin 01/28/2017  . Colitis 01/26/2017  . Acute MI ([place]) 05/05/2014  . Anemia, iron deficiency 05/05/2014  . Airway hyperreactivity 05/05/2014  . Diabetes mellitus, type 2 ([place])  05/05/2014  . Chronic systolic heart failure ([place]) 05/05/2014  . HLD (hyperlipidemia) 05/05/2014  . Disease of thyroid gland 05/05/2014  . Nonischemic cardiomyopathy ([place]) 02/02/2014  . LBBB (left bundle branch block) 02/02/2014  . Chronic systolic dysfunction of left ventricle 02/02/2014  . Chronic renal insufficiency 02/02/2014  . Essential hypertension 02/02/2014  . Morbid obesity ([place]) 02/02/2014  . History of prolonged Q-T interval on ECG 10/31/2012  . Automatic implantable cardioverter-defibrillator in situ 08/06/2008    Past Surgical History:  Procedure Laterality Date  . APPENDECTOMY  07/13/2017  . BLADDER SUSPENSION  1990   "w/hysterectomy"  . BOWEL RESECTION N/A 07/09/2018   Procedure: SMALL BOWEL RESECTION;  Surgeon: Judeth Horn, MD;  Location: [place];  Service: General;  Laterality: N/A;  . CARDIAC CATHETERIZATION  2005   no obstructive CAD per patient  . CARDIAC CATHETERIZATION  2018  . CARDIAC DEFIBRILLATOR PLACEMENT  2005   BiV ICD implanted,  LV lead is an epicardial lead  . CARPAL TUNNEL RELEASE Left 07/25/2014   Dr.Williamson   . CARPAL TUNNEL RELEASE Right 04/08/2019   Procedure: RIGHT CARPAL TUNNEL RELEASE ENDOSCOPIC;  Surgeon: Milly Jakob, MD;  Location: [place];  Service: Orthopedics;  Laterality: Right;  . CATARACT EXTRACTION W/ INTRAOCULAR LENS  IMPLANT, BILATERAL Bilateral   . COLONIC STENT PLACEMENT N/A 08/31/2017   Procedure: COLONIC STENT PLACEMENT;  Surgeon: Carol Ada, MD;  Location: [place];  Service: Endoscopy;  Laterality: N/A;  . COLONOSCOPY     2010-2011 Dr.Kipreos   . COLOSTOMY  12/2016   Archie Endo 01/26/2017  . COLOSTOMY REVERSAL N/A 07/13/2017   Procedure: COLOSTOMY REVERSAL;  Surgeon: Judeth Horn, MD;  Location: [place];  Service: General;  Laterality: N/A;  . CYST REMOVAL HAND Right 05/2013   thumb  . DILATION AND CURETTAGE OF UTERUS  X 5-6  . EXCISION MASS ABDOMINAL N/A 07/09/2018   Procedure: EXPLORATION OF  ABDOMINAL WOUND ERAS  PATHWAY;  Surgeon: Judeth Horn, MD;  Location: [place];  Service: General;  Laterality: N/A;  . FLEXIBLE SIGMOIDOSCOPY N/A 08/24/2017   Procedure: Beryle Quant;  Surgeon: Carol Ada, MD;  Location: [place];  Service: Endoscopy;  Laterality: N/A;  . FLEXIBLE SIGMOIDOSCOPY N/A 08/31/2017   Procedure: FLEXIBLE SIGMOIDOSCOPY;  Surgeon: Carol Ada, MD;  Location: [place];  Service: Endoscopy;  Laterality: N/A;  stent placement  . IMPLANTABLE CARDIOVERTER DEFIBRILLATOR GENERATOR CHANGE  2008  . INSERTION OF MESH N/A 07/09/2018   Procedure: INSERTION OF VICRYL MESH;  Surgeon: Judeth Horn, MD;  Location: [place];  Service: General;  Laterality: N/A;  . LAPAROTOMY N/A 09/18/2017   Procedure: EXPLORATORY LAPAROTOMY;  Surgeon: Judeth Horn, MD;  Location: [place];  Service: General;  Laterality: N/A;  . LEAD REVISION  06/22/2018  . LEAD REVISION/REPAIR N/A 06/22/2018   Procedure:  LEAD REVISION/REPAIR;  Surgeon: Deboraha Sprang, MD;  Location: [place] [facility];  Service: Cardiovascular;  Laterality: N/A;  . LYSIS OF ADHESION N/A 09/18/2017   Procedure: LYSIS OF ADHESION;  Surgeon: Judeth Horn, MD;  Location: [place];  Service: General;  Laterality: N/A;  . LYSIS OF ADHESION N/A 07/09/2018   Procedure: LYSIS OF ADHESION;  Surgeon: Judeth Horn, MD;  Location: [place];  Service: General;  Laterality: N/A;  . PARTIAL COLECTOMY N/A 09/18/2017   Procedure: ILEOCOLECTOMY;  Surgeon: Judeth Horn, MD;  Location: [place];  Service: General;  Laterality: N/A;  . PARTIAL COLECTOMY  09/25/2017  . RIGHT/LEFT HEART CATH AND CORONARY ANGIOGRAPHY N/A 06/01/2018   Procedure: RIGHT/LEFT HEART CATH AND CORONARY ANGIOGRAPHY;  Surgeon: Jettie Booze, MD;  Location: [place] [facility];  Service: Cardiovascular;  Laterality: N/A;  . SIGMOIDOSCOPY N/A 09/18/2017   Procedure: SIGMOIDOSCOPY;  Surgeon: Judeth Horn, MD;  Location: [place];  Service: General;  Laterality: N/A;  . SMALL INTESTINE SURGERY  07/09/2018     EXPLORATION OF ABDOMINAL WOUND ERAS PATHWAYN/; MESH; LYSIS OF ADHESIONS  . TOTAL ABDOMINAL HYSTERECTOMY  1990  . TRIGGER FINGER RELEASE Right 05/213  . TRIGGER FINGER RELEASE Right 04/08/2019   Procedure: RIGHT INDEX RELEASE TRIGGER FINGER/A-1 PULLEY;  Surgeon: Milly Jakob, MD;  Location: [place];  Service: Orthopedics;  Laterality: Right;  . TUBAL LIGATION  1980s  . VESICOVAGINAL FISTULA CLOSURE W/ TAH      Allergies Ace inhibitors, Glucophage [metformin hcl], Telmisartan, Advicor [niacin-lovastatin er], Bystolic [nebivolol hcl], Erythromycin, Lipitor [atorvastatin], Lopid [gemfibrozil], and Statins  Family History  Problem Relation Age of Onset  . Hypertension Father   . Heart disease Father   . Cancer Father        prostate  . Heart disease Other        (Maternal side) Ischemic heart disease  . Diabetes Mellitus II Other   . Arrhythmia Mother   . Diabetes Mellitus II Mother        Borderline DM  . Hypertension Mother   . Asthma Mother   . Cancer Brother   . Dementia Brother   . Hypertension Brother   . Hypertension Daughter   . Hypertension Daughter   . Diabetes Daughter   . Hypertension Daughter   . Atrial fibrillation Daughter   . GER disease Daughter   . Hypertension Son   . Anxiety disorder Son   . Hypothyroidism Son   . Breast cancer Maternal Grandmother     Social History Social History   Tobacco Use  . Smoking status: Never Smoker  . Smokeless tobacco: Never Used  Substance Use Topics  . Alcohol use: Never  . Drug use: Never    Review of Systems  Constitutional: No fever/chills Eyes: No visual changes. ENT: No sore throat. Cardiovascular: Denies chest pain. Respiratory: Denies shortness of breath. Gastrointestinal: Positive abdominal pain. Positive nausea, no vomiting.  No diarrhea.  No constipation. Genitourinary: Negative for dysuria. Musculoskeletal: Negative for back pain. Skin: Negative for rash. Neurological: Negative for headaches,  focal weakness or numbness.  10-point ROS otherwise negative.  ____________________________________________   PHYSICAL EXAM:  VITAL SIGNS: ED Triage Vitals  Enc Vitals Group     BP 08/17/19 1222 (!) 113/56     Pulse Rate 08/17/19 1222 73     Resp 08/17/19 1222 20     Temp 08/17/19 1222 98 F (36.7 C)     Temp Source 08/17/19 1222 Oral     SpO2  08/17/19 1222 96 %     Weight 08/17/19 1255 232 lb (105.2 kg)     Height 08/17/19 1255 5\' 5"  (1.651 m)   Constitutional: Alert and oriented. Well appearing and in no acute distress. Eyes: Conjunctivae are normal.  Head: Atraumatic. Nose: No congestion/rhinnorhea. Mouth/Throat: Mucous membranes are moist.   Neck: No stridor.   Cardiovascular: Normal rate, regular rhythm. Good peripheral circulation. Grossly normal heart sounds.   Respiratory: Normal respiratory effort.  No retractions. Lungs CTAB. Gastrointestinal: Soft with mild tenderness over multiple incisional hernias with clinical evidence of incarcerated hernia. No distention.  Musculoskeletal: No gross deformities of extremities. Neurologic:  Normal speech and language.  Skin:  Skin is warm, dry and intact. No rash noted.   ____________________________________________   LABS (all labs ordered are listed, but only abnormal results are displayed)  Labs Reviewed  COMPREHENSIVE METABOLIC PANEL - Abnormal; Notable for the following components:      Result Value   Sodium 131 (*)    Potassium 5.5 (*)    Chloride 94 (*)    Glucose, Bld 314 (*)    BUN 50 (*)    Creatinine, Ser 1.98 (*)    Albumin 3.4 (*)    Alkaline Phosphatase 128 (*)    Total Bilirubin 1.4 (*)    GFR calc non Af Amer 25 (*)    GFR calc Af Amer 29 (*)    All other components within normal limits  CBC - Abnormal; Notable for the following components:   WBC 11.0 (*)    Hemoglobin 11.7 (*)    RDW 16.1 (*)    All other components within normal limits  URINALYSIS, ROUTINE W REFLEX MICROSCOPIC - Abnormal;  Notable for the following components:   APPearance CLOUDY (*)    Protein, ur 30 (*)    Leukocytes,Ua LARGE (*)    Bacteria, UA RARE (*)    Non Squamous Epithelial 0-5 (*)    All other components within normal limits  LIPASE, BLOOD  LACTIC ACID, PLASMA   ____________________________________________  EKG   EKG Interpretation  Date/Time:  Saturday August 17 2019 14:45:35 EST Ventricular Rate:  76 PR Interval:    QRS Duration: 141 QT Interval:  471 QTC Calculation: 530 R Axis:   -51 Text Interpretation: Atrial-sensed ventricular-paced rhythm No further analysis attempted due to paced rhythm Confirmed by Nanda Quinton [phone]) on 08/17/2019 3:04:51 PM       ____________________________________________  RADIOLOGY  CT abdomen/pelvis pending.  ____________________________________________   PROCEDURES  Procedure(s) performed:   Procedures  None  ____________________________________________   INITIAL IMPRESSION / ASSESSMENT AND PLAN / ED COURSE  Pertinent labs & imaging results that were available during my care of the patient were reviewed by me and considered in my medical decision making (see chart for details).   Patient presents the emergency department with worsening abdominal pain concern clinically for bowel obstruction.  She has a complicated surgical history and prior SBO.  She does have some tenderness over her incisional hernias but no clinical concern for incarceration.  Lactate is normal.  She does show CKD with creatinine near her baseline and potassium of 5.5.  Will follow EKG.  Leukocytes with rare bacteria on UA.  Patient is not having UTI symptoms.   Labs with CKD and slight elevated potassium of 5.5.  Will shift with insulin for now pending CT.  Patient will drink PO contrast. Dr. Ashok Cordia to f/u on CT.  ____________________________________________  FINAL CLINICAL IMPRESSION(S) / ED DIAGNOSES  Final diagnoses:  Generalized abdominal pain  Nausea     MEDICATIONS GIVEN DURING THIS VISIT:  Medications  sodium chloride flush (NS) 0.9 % injection 3 mL (3 mLs Intravenous Not Given 08/17/19 1254)  insulin aspart (novoLOG) injection 10 Units (has no administration in time range)  dextrose 50 % solution 50 mL (has no administration in time range)  sodium chloride 0.9 % bolus 250 mL (has no administration in time range)  ondansetron (ZOFRAN) injection 4 mg (4 mg Intravenous Given 08/17/19 1332)     Note:  This document was prepared using Dragon voice recognition software and may include unintentional dictation errors.  Nanda Quinton, MD, [facility] Emergency Medicine    Karlos Scadden, [facility], MD 08/17/19 [phone]

## 2019-08-17 NOTE — H&P (Signed)
MAZIYAH VESSEL VVO:[id number] DOB: [birth year] DOA: 08/17/2019     PCP: Crist Infante, MD   Outpatient Specialists:    CARDS:  Dr.Varanassi, [place]   GI Dr. Benson [place]    Patient arrived to ER on 08/17/19 at 1220  Patient coming from: home Lives   With family    Chief Complaint:   Chief Complaint  Patient presents with  . Abdominal Pain    HPI: Crystal [name] is a 69 y.o. female with medical history significant of positive for COVID 2/69/4854, chronic systolic CHF, uncontrolled diabetes type 2, CKD stage IIIb, hypothyroidism, asthma/COPD chronic chronic pain, status post AICD    Presented with stabbing lower abdominal pain with nausea since yesterday belching excessively similar to prior history of small bowel obstruction last bowel movement was yesterday. Symptoms similar to prior small bowel obstruction   she was recently admitted for Covid infection from 13th-16th of January 2021 she was treated with Decadron and remdesivir.  Continued early secondary to severe hyperglycemia Hospital stay complicated by acute hypoxic respiratory failure Patient did not need Actemra as she was relatively clinically stable and had elevated procalcitonin  Known hx of recurrent bowel obstruction needing operative repair sp colostomy and colostomy take down by Dr. Hulen Skains.   Infectious risk factors:  Reports dry cough, chest pain,   N/  abdominal pain,       KNOWN COVID POSITIVE   In  ER PCR  COVID TEST  NEGATIVE But given recent COVID infection on precautions until 08/29/2019  Lab Results  Component Value Date   [place] NEGATIVE 08/17/2019   [place] NEGATIVE 04/05/2019    Regarding pertinent Chronic problems:     Hyperlipidemia - on statins Crestor   HTN on Coreg   chronic CHF  Systolic  - last echo EF 62-70% via TTE March 2020  Patient not on ACE secondary to angioedema and AKI On Torsemide     DM 2 -  Lab Results  Component Value Date   HGBA1C 8.9 (H) 08/07/2019    on  insulin,    Hypothyroidism:  Lab Results  Component Value Date   TSH 0.53 05/04/2018   on synthroid    obesity-   BMI Readings from Last 1 Encounters:  08/17/19 38.61 kg/m        COPD - not followed by pulmonology       CKD stage IIIb- baseline Cr  2.2 Lab Results  Component Value Date   CREATININE 1.98 (H) 08/17/2019   CREATININE 1.55 (H) 08/10/2019   CREATININE 1.71 (H) 08/09/2019     While in ER: Sodium 131 potassium 4.5 She was given IV fluids 10 units of insulin  And D50  CT showing small bowel obstruction ventral hernia Ventral hernia appears to be reducible  ER Provider Called: General surgery   Dr Bobbye Morton They Recommend admit to medicine Place NG tube Will see in ER    The following Work up has been ordered so far:  Orders Placed This Encounter  Procedures  . Urine Culture  . Respiratory Panel by RT PCR (Flu A&B, Covid) - Nasopharyngeal Swab  . CT ABDOMEN PELVIS WO CONTRAST  . DG Abd Portable 1V-Small Bowel Protocol-Position Verification  . DG Abd Portable 1V-Small Bowel Obstruction Protocol-initial, 8 hr delay  . Lipase, blood  . Comprehensive metabolic panel  . CBC  . Urinalysis, Routine w reflex microscopic  . Lactic acid, plasma  . Diet NPO time specified  . Saline Lock IV, Maintain IV  access  . Insert NG/OG (Gastric Tube)  . Refer to Sidebar: Small Bowel Obstruction Protocol  . Gastric tube  . Elevate head of bed  . Ensure patient not vomiting prior to administration of Gastrografin. Notify MD if patient is actively vomiting  . Clamp NG tube for 1 hour after administration of Gastrografin then resume NG tube to low wall intermittent suction  . Consult to general surgery  ALL PATIENTS BEING ADMITTED/HAVING PROCEDURES NEED COVID-19 SCREENING  . Consult to hospitalist  ALL PATIENTS BEING ADMITTED/HAVING PROCEDURES NEED COVID-19 SCREENING  . ED EKG  . EKG 12-Lead    Following Medications were ordered in ER: Medications  sodium chloride flush  (NS) 0.9 % injection 3 mL (3 mLs Intravenous Not Given 08/17/19 1254)  diatrizoate meglumine-sodium (GASTROGRAFIN) 66-10 % solution 90 mL (has no administration in time range)  ondansetron (ZOFRAN) injection 4 mg (4 mg Intravenous Given 08/17/19 1332)  insulin aspart (novoLOG) injection 10 Units (10 Units Intravenous Given 08/17/19 1545)  dextrose 50 % solution 50 mL (50 mLs Intravenous Given 08/17/19 1545)  sodium chloride 0.9 % bolus 250 mL (0 mLs Intravenous Stopped 08/17/19 1646)  cefTRIAXone (ROCEPHIN) 1 g in sodium chloride 0.9 % 100 mL IVPB (0 g Intravenous Stopped 08/17/19 1735)        Consult Orders  (From admission, onward)         Start     Ordered   08/17/19 1804  Consult to hospitalist  ALL PATIENTS BEING ADMITTED/HAVING PROCEDURES NEED COVID-19 SCREENING Pg sent by dee  Once    Comments: ALL PATIENTS BEING ADMITTED/HAVING PROCEDURES NEED COVID-19 SCREENING  Provider:  (Not yet assigned)  Question Answer Comment  Place call to: [facility]   Reason for Consult Admit      08/17/19 1803           Significant initial  Findings: Abnormal Labs Reviewed  COMPREHENSIVE METABOLIC PANEL - Abnormal; Notable for the following components:      Result Value   Sodium 131 (*)    Potassium 5.5 (*)    Chloride 94 (*)    Glucose, Bld 314 (*)    BUN 50 (*)    Creatinine, Ser 1.98 (*)    Albumin 3.4 (*)    Alkaline Phosphatase 128 (*)    Total Bilirubin 1.4 (*)    GFR calc non Af Amer 25 (*)    GFR calc Af Amer 29 (*)    All other components within normal limits  CBC - Abnormal; Notable for the following components:   WBC 11.0 (*)    Hemoglobin 11.7 (*)    RDW 16.1 (*)    All other components within normal limits  URINALYSIS, ROUTINE W REFLEX MICROSCOPIC - Abnormal; Notable for the following components:   APPearance CLOUDY (*)    Protein, ur 30 (*)    Leukocytes,Ua LARGE (*)    Bacteria, UA RARE (*)    Non Squamous Epithelial 0-5 (*)    All other components within  normal limits    Otherwise labs showing:    Recent Labs  Lab 08/17/19 1232  NA 131*  K 5.5*  CO2 24  GLUCOSE 314*  BUN 50*  CREATININE 1.98*  CALCIUM 9.9    Cr   Stable  Lab Results  Component Value Date   CREATININE 1.98 (H) 08/17/2019   CREATININE 1.55 (H) 08/10/2019   CREATININE 1.71 (H) 08/09/2019    Recent Labs  Lab 08/17/19 1232  AST 34  ALT  36  ALKPHOS 128*  BILITOT 1.4*  PROT 7.8  ALBUMIN 3.4*   Lab Results  Component Value Date   CALCIUM 9.9 08/17/2019   PHOS 4.4 09/02/2017     WBC      Component Value Date/Time   WBC 11.0 (H) 08/17/2019 1232   ANC    Component Value Date/Time   NEUTROABS 8.3 (H) 08/10/2019 0047   NEUTROABS 6.4 06/11/2018 1116   ALC No components found for: LYMPHAB    Plt: Lab Results  Component Value Date   PLT 344 08/17/2019    Lactic Acid, Venous    Component Value Date/Time   LATICACIDVEN 1.7 08/17/2019 1316      COVID-19 Labs  No results for input(s): DDIMER, FERRITIN, LDH, CRP in the last 72 hours.  Lab Results  Component Value Date   SARSCOV2NAA NEGATIVE 08/17/2019   [place] NEGATIVE 04/05/2019       HG/HCT   stable,       Component Value Date/Time   HGB 11.7 (L) 08/17/2019 1232   HGB 11.9 09/24/2018 1052   HCT 37.1 08/17/2019 1232   HCT 37.0 09/24/2018 1052    Recent Labs  Lab 08/17/19 1232  LIPASE 31      ECG: Ordered Personally reviewed by me showing: HR : 76 Rhythm:   Paced   no evidence of ischemic changes QTC 530   BNP (last 3 results) Recent Labs    08/07/19 1732  BNP 50.2    ProBNP (last 3 results) No results for input(s): PROBNP in the last 8760 hours.  DM  labs:  HbA1C: Recent Labs    08/07/19 2108  HGBA1C 8.9*     CBG (last 3)  No results for input(s): GLUCAP in the last 72 hours.     UA evidence of UTI      Urine analysis:    Component Value Date/Time   COLORURINE YELLOW 08/17/2019 1349   APPEARANCEUR CLOUDY (A) 08/17/2019 1349   LABSPEC 1.014  08/17/2019 1349   PHURINE 5.0 08/17/2019 1349   GLUCOSEU NEGATIVE 08/17/2019 1349   HGBUR NEGATIVE 08/17/2019 1349   [place] 08/17/2019 1349   [place] 08/17/2019 1349   PROTEINUR 30 (A) 08/17/2019 1349   NITRITE NEGATIVE 08/17/2019 1349   LEUKOCYTESUR LARGE (A) 08/17/2019 1349       Ordered   CTabd/pelvis -distal small bowel obstruction cholelithiasis right adrenal mass adrenal adenoma will need to be followed up as an     ED Triage Vitals  Enc Vitals Group     BP 08/17/19 1222 (!) 113/56     Pulse Rate 08/17/19 1222 73     Resp 08/17/19 1222 20     Temp 08/17/19 1222 98 F (36.7 C)     Temp Source 08/17/19 1222 Oral     SpO2 08/17/19 1222 96 %     Weight 08/17/19 1255 232 lb (105.2 kg)     Height 08/17/19 1255 5\' 5"  (1.651 m)     Head Circumference --      Peak Flow --      Pain Score 08/17/19 1230 8     Pain Loc --      Pain Edu? --      Excl. in [place]? --   TMAX(24)@       Latest  Blood pressure (!) 112/58, pulse 80, temperature 98 F (36.7 C), temperature source Oral, resp. rate 15, height 5\' 5"  (1.651 m), weight 105.2 kg, SpO2 94 %.  Hospitalist was called for admission for SBO   Review of Systems:    Pertinent positives include: abdominal pain, nausea,   Constitutional:  No weight loss, night sweats, Fevers, chills, fatigue, weight loss  HEENT:  No headaches, Difficulty swallowing,Tooth/dental problems,Sore throat,  No sneezing, itching, ear ache, nasal congestion, post nasal drip,  Cardio-vascular:  No chest pain, Orthopnea, PND, anasarca, dizziness, palpitations.no Bilateral lower extremity swelling  GI:  No heartburn, indigestion, vomiting, diarrhea, change in bowel habits, loss of appetite, melena, blood in stool, hematemesis Resp:  no shortness of breath at rest. No dyspnea on exertion, No excess mucus, no productive cough, No non-productive cough, No coughing up of blood.No change in color of mucus.No wheezing. Skin:  no  rash or lesions. No jaundice GU:  no dysuria, change in color of urine, no urgency or frequency. No straining to urinate.  No flank pain.  Musculoskeletal:  No joint pain or no joint swelling. No decreased range of motion. No back pain.  Psych:  No change in mood or affect. No depression or anxiety. No memory loss.  Neuro: no localizing neurological complaints, no tingling, no weakness, no double vision, no gait abnormality, no slurred speech, no confusion  All systems reviewed and apart from [place] all are negative  Past Medical History:   Past Medical History:  Diagnosis Date  . AICD (automatic cardioverter/defibrillator) present    high RV threshold chronically, device was turned off in 2014; Device battery has been dead x 7 years; "turned it back on 06/22/2018"  . Anemia, iron deficiency   . Angioedema    felt to likely be due to ace inhibitors but says she has had this even off of medicines, appears to be tolerating ARBs chronically  . Anxiety   . Arthritis    "hands, legs, arms; bad in my back" (06/22/2018)  . Asthmatic bronchitis with status asthmaticus   . Bradycardia   . CHF (congestive heart failure) ([place])   . Chronic lower back pain   . Chronic pain   . Chronic renal insufficiency   . CKD (chronic kidney disease), stage III   . Coronary artery disease    Mild nonobstructive (30% LAD, 30% RCA) by 09/01/16 cath at [facility]  . Degenerative joint disease   . Depression   . Diabetes mellitus, type 2 ([place])   . Diabetic peripheral neuropathy ([place])   . Dizziness   . Dyspnea on exertion   . Family history of adverse reaction to anesthesia    Mother has nausea  . GERD (gastroesophageal reflux disease)   . Gout   . Heart valve disorder   . History of blood transfusion 1981; ~ 2005; 12/2016   "childbirth; defibrillator OR; colostomy OR"  . History of mononucleosis 03/2014  . Hyperlipidemia   . Hypertension   . Hypothyroid   . Insomnia   . Intervertebral disc  degeneration   . Ischemic cardiomyopathy   . LBBB (left bundle branch block)   . Myocardial infarction ([place]) dx'd ~ 2005  . Nonischemic cardiomyopathy ([place])    s/p ICD in 2005. EF has since recovered  . Obesity   . On home oxygen therapy    "2L at night" (06/22/2018)  . OSA on CPAP   . Pneumonia    "couple times; last time was 12/2016" (06/22/2018)  . PONV (postoperative nausea and vomiting)   . Presence of permanent cardiac pacemaker    [employer]  . Swelling   . Syncope   . Systemic  hypertension   . Vitamin D deficiency       Past Surgical History:  Procedure Laterality Date  . APPENDECTOMY  07/13/2017  . BLADDER SUSPENSION  1990   "w/hysterectomy"  . BOWEL RESECTION N/A 07/09/2018   Procedure: SMALL BOWEL RESECTION;  Surgeon: Judeth Horn, MD;  Location: [place];  Service: General;  Laterality: N/A;  . CARDIAC CATHETERIZATION  2005   no obstructive CAD per patient  . CARDIAC CATHETERIZATION  2018  . CARDIAC DEFIBRILLATOR PLACEMENT  2005   BiV ICD implanted,  LV lead is an epicardial lead  . CARPAL TUNNEL RELEASE Left 07/25/2014   Dr.Williamson   . CARPAL TUNNEL RELEASE Right 04/08/2019   Procedure: RIGHT CARPAL TUNNEL RELEASE ENDOSCOPIC;  Surgeon: Milly Jakob, MD;  Location: [place];  Service: Orthopedics;  Laterality: Right;  . CATARACT EXTRACTION W/ INTRAOCULAR LENS  IMPLANT, BILATERAL Bilateral   . COLONIC STENT PLACEMENT N/A 08/31/2017   Procedure: COLONIC STENT PLACEMENT;  Surgeon: Carol Ada, MD;  Location: [place];  Service: Endoscopy;  Laterality: N/A;  . COLONOSCOPY     2010-2011 Dr.Kipreos   . COLOSTOMY  12/2016   Archie Endo 01/26/2017  . COLOSTOMY REVERSAL N/A 07/13/2017   Procedure: COLOSTOMY REVERSAL;  Surgeon: Judeth Horn, MD;  Location: [place];  Service: General;  Laterality: N/A;  . CYST REMOVAL HAND Right 05/2013   thumb  . DILATION AND CURETTAGE OF UTERUS  X 5-6  . EXCISION MASS ABDOMINAL N/A 07/09/2018   Procedure: EXPLORATION OF  ABDOMINAL  WOUND ERAS PATHWAY;  Surgeon: Judeth Horn, MD;  Location: [place];  Service: General;  Laterality: N/A;  . FLEXIBLE SIGMOIDOSCOPY N/A 08/24/2017   Procedure: Beryle Quant;  Surgeon: Carol Ada, MD;  Location: [place];  Service: Endoscopy;  Laterality: N/A;  . FLEXIBLE SIGMOIDOSCOPY N/A 08/31/2017   Procedure: FLEXIBLE SIGMOIDOSCOPY;  Surgeon: Carol Ada, MD;  Location: [place];  Service: Endoscopy;  Laterality: N/A;  stent placement  . IMPLANTABLE CARDIOVERTER DEFIBRILLATOR GENERATOR CHANGE  2008  . INSERTION OF MESH N/A 07/09/2018   Procedure: INSERTION OF VICRYL MESH;  Surgeon: Judeth Horn, MD;  Location: [place];  Service: General;  Laterality: N/A;  . LAPAROTOMY N/A 09/18/2017   Procedure: EXPLORATORY LAPAROTOMY;  Surgeon: Judeth Horn, MD;  Location: [place];  Service: General;  Laterality: N/A;  . LEAD REVISION  06/22/2018  . LEAD REVISION/REPAIR N/A 06/22/2018   Procedure: LEAD REVISION/REPAIR;  Surgeon: Deboraha Sprang, MD;  Location: [place] [facility];  Service: Cardiovascular;  Laterality: N/A;  . LYSIS OF ADHESION N/A 09/18/2017   Procedure: LYSIS OF ADHESION;  Surgeon: Judeth Horn, MD;  Location: [place];  Service: General;  Laterality: N/A;  . LYSIS OF ADHESION N/A 07/09/2018   Procedure: LYSIS OF ADHESION;  Surgeon: Judeth Horn, MD;  Location: [place];  Service: General;  Laterality: N/A;  . PARTIAL COLECTOMY N/A 09/18/2017   Procedure: ILEOCOLECTOMY;  Surgeon: Judeth Horn, MD;  Location: [place];  Service: General;  Laterality: N/A;  . PARTIAL COLECTOMY  09/25/2017  . RIGHT/LEFT HEART CATH AND CORONARY ANGIOGRAPHY N/A 06/01/2018   Procedure: RIGHT/LEFT HEART CATH AND CORONARY ANGIOGRAPHY;  Surgeon: Jettie Booze, MD;  Location: [place] [facility];  Service: Cardiovascular;  Laterality: N/A;  . SIGMOIDOSCOPY N/A 09/18/2017   Procedure: SIGMOIDOSCOPY;  Surgeon: Judeth Horn, MD;  Location: [place];  Service: General;  Laterality: N/A;  . SMALL INTESTINE SURGERY   07/09/2018   EXPLORATION OF ABDOMINAL WOUND ERAS PATHWAYN/; MESH; LYSIS OF ADHESIONS  .  TOTAL ABDOMINAL HYSTERECTOMY  1990  . TRIGGER FINGER RELEASE Right 05/213  . TRIGGER FINGER RELEASE Right 04/08/2019   Procedure: RIGHT INDEX RELEASE TRIGGER FINGER/A-1 PULLEY;  Surgeon: Milly Jakob, MD;  Location: [place];  Service: Orthopedics;  Laterality: Right;  . TUBAL LIGATION  1980s  . VESICOVAGINAL FISTULA CLOSURE W/ TAH      Social History:  Ambulatory  independently       reports that she has never smoked. She has never used smokeless tobacco. She reports that she does not drink alcohol or use drugs.     Family History:   Family History  Problem Relation Age of Onset  . Hypertension Father   . Heart disease Father   . Cancer Father        prostate  . Heart disease Other        (Maternal side) Ischemic heart disease  . Diabetes Mellitus II Other   . Arrhythmia Mother   . Diabetes Mellitus II Mother        Borderline DM  . Hypertension Mother   . Asthma Mother   . Cancer Brother   . Dementia Brother   . Hypertension Brother   . Hypertension Daughter   . Hypertension Daughter   . Diabetes Daughter   . Hypertension Daughter   . Atrial fibrillation Daughter   . GER disease Daughter   . Hypertension Son   . Anxiety disorder Son   . Hypothyroidism Son   . Breast cancer Maternal Grandmother     Allergies: Allergies  Allergen Reactions  . Ace Inhibitors Swelling and Other (See Comments)    Angioedema  . Glucophage [Metformin Hcl] Other (See Comments)    Renal failure  . Telmisartan Other (See Comments)    AVOID ARB/ ACEi in this patient due to recurrent AKI  . Advicor [Niacin-Lovastatin Er] Other (See Comments)    Muscle aches  . Bystolic [Nebivolol Hcl] Swelling and Other (See Comments)    UNSPECIFIED SWELLING/EDEMA  . Erythromycin Diarrhea, Nausea And Vomiting and Other (See Comments)    *DERIVATIVES*  . Lipitor [Atorvastatin] Other (See Comments)    Muscle  aches  . Lopid [Gemfibrozil] Other (See Comments)    Muscle aches  . Statins Other (See Comments)    Muscle aches tolerates crestor     Prior to Admission medications   Medication Sig Start Date End Date Taking? Authorizing Provider  acetaminophen (TYLENOL) 500 MG tablet Take 500 mg by mouth as needed.    [provider]  albuterol (PROVENTIL HFA;VENTOLIN HFA) 108 (90 Base) MCG/ACT inhaler Inhale 2 puffs into the lungs every 6 (six) hours as needed for wheezing or shortness of breath.     [provider]  allopurinol (ZYLOPRIM) 100 MG tablet TAKE 1 TABLET(100 MG) BY MOUTH DAILY Patient taking differently: Take 100 mg by mouth daily.  05/21/18   Gildardo Cranker, DO  Calcium Carbonate-Vitamin D (CALTRATE 600+D) 600-400 MG-UNIT tablet Take 1 tablet by mouth 2 (two) times daily.    [provider]  carvedilol (COREG) 25 MG tablet Take 25 mg by mouth 2 (two) times daily with a meal.    [provider]  CRESTOR 10 MG tablet Take 1 tablet (10 mg total) by mouth at bedtime. 02/15/17   Love, Ivan Anchors, PA-C  DULoxetine (CYMBALTA) 30 MG capsule Take 1 capsule (30 mg total) by mouth daily. 10/15/18   Ngetich, Dinah C, NP  gabapentin (NEURONTIN) 300 MG capsule Take 300 mg by mouth at bedtime.  [provider]  insulin regular human CONCENTRATED (HUMULIN R U-500 KWIKPEN) 500 UNIT/ML kwikpen Inject 100-120 Units into the skin See admin instructions. 120 units tid with meals and 100 units hs    [provider]  levothyroxine (SYNTHROID, LEVOTHROID) 88 MCG tablet Take 88 mcg by mouth daily before breakfast.  10/16/15   [provider]  loratadine (CLARITIN) 10 MG tablet Take 10 mg by mouth daily.    [provider]  montelukast (SINGULAIR) 10 MG tablet Take 10 mg by mouth at bedtime.    [provider]  omega-3 acid ethyl esters (LOVAZA) 1 g capsule Take 4 g by mouth at bedtime.     [provider]  pantoprazole (PROTONIX)  40 MG tablet Take 40 mg by mouth every evening. 05/23/18   [provider]  temazepam (RESTORIL) 15 MG capsule Take 1 capsule (15 mg total) by mouth at bedtime as needed for sleep. 05/07/18   Gildardo Cranker, DO  torsemide (DEMADEX) 20 MG tablet Take 30 mg by mouth daily.    [provider]  traMADol (ULTRAM) 50 MG tablet Take 1 tablet (50 mg total) by mouth every 6 (six) hours as needed for moderate pain or severe pain. 07/15/18   Judeth Horn, MD   Physical Exam: Blood pressure (!) 112/58, pulse 80, temperature 98 F (36.7 C), temperature source Oral, resp. rate 15, height 5\' 5"  (1.651 m), weight 105.2 kg, SpO2 94 %. 1. General:  in No  Acute distress   Chronically ill -appearing 2. Psychological: Alert and   Oriented 3. Head/ENT:   Moist   Mucous Membranes                          Head Non traumatic, neck supple                           Poor Dentition 4. SKIN:  decreased Skin turgor,  Skin clean Dry and intact no rash 5. Heart: Regular rate and rhythm no  Murmur, no Rub or gallop 6. Lungs:   no wheezes or crackles   7. Abdomen: Soft,  non-tender,   distended   Obese  8. Lower extremities: no clubbing, cyanosis, no  edema 9. Neurologically Grossly intact, moving all 4 extremities equally  10. MSK: Normal range of motion   All other LABS:     Recent Labs  Lab 08/17/19 1232  WBC 11.0*  HGB 11.7*  HCT 37.1  MCV 82.8  PLT 344     Recent Labs  Lab 08/17/19 1232  NA 131*  K 5.5*  CL 94*  CO2 24  GLUCOSE 314*  BUN 50*  CREATININE 1.98*  CALCIUM 9.9     Recent Labs  Lab 08/17/19 1232  AST 34  ALT 36  ALKPHOS 128*  BILITOT 1.4*  PROT 7.8  ALBUMIN 3.4*       Cultures:    Component Value Date/Time   SDES URINE, CATHETERIZED 09/24/2017 0950   SPECREQUEST  09/24/2017 0950    NONE Performed at [facility] [address] [place], [place] [address]    CULT >=100,000 COLONIES/mL ENTEROCOCCUS FAECALIS (A) 09/24/2017 0950   REPTSTATUS  09/26/2017 FINAL 09/24/2017 0950     Radiological Exams on Admission: CT ABDOMEN PELVIS WO CONTRAST  Result Date: 08/17/2019 CLINICAL DATA:  Lower abdominal pain with nausea. EXAM: CT ABDOMEN AND PELVIS WITHOUT CONTRAST TECHNIQUE: Multidetector CT imaging  of the abdomen and pelvis was performed following the standard protocol without IV contrast. COMPARISON:  April 17, 2018 FINDINGS: Lower chest: Small patchy areas of atelectasis and/or infiltrate are seen within the bilateral lung bases. Hepatobiliary: No focal liver abnormality is seen. A subcentimeter gallstone is seen within the lumen of an otherwise normal-appearing gallbladder. Pancreas: Unremarkable. No pancreatic ductal dilatation or surrounding inflammatory changes. Spleen: Normal in size without focal abnormality. Adrenals/Urinary Tract: There is a stable 1.9 cm x 1.5 cm low-attenuation right adrenal mass. The left adrenal gland is normal in appearance. Kidneys are normal, without renal calculi, focal lesion, or hydronephrosis. Bladder is unremarkable. Stomach/Bowel: Stomach is within normal limits. Surgically anastomosed bowel is seen within the anterior aspect of the lower abdomen, along the midline. Multiple dilated small bowel loops are seen (maximum small bowel diameter of approximately 3.8 cm). These dilated loops extend into a 3.8 cm x 2.0 cm ventral hernia seen along the lower pelvic wall, to the left of midline (axial CT image 77, CT series number 3). An additional 5.9 cm x 4.5 cm ventral hernia is seen along the lower pelvic wall on the right. This contains a dilated loop of small bowel. The loop of small bowel that exits this hernia is normal in caliber (axial CT images 68 through 80, CT series number 3). Vascular/Lymphatic: Moderate severity aortic atherosclerosis. No enlarged abdominal or pelvic lymph nodes. Reproductive: Status post hysterectomy. No adnexal masses. Other: No abdominopelvic ascites. Musculoskeletal: Multilevel  degenerative changes seen throughout the lumbar spine. IMPRESSION: 1. Distal small bowel obstruction secondary to the presence of ventral hernia located within the anterior aspect of the lower pelvic wall on the right. 2. Cholelithiasis. 3. Stable low-attenuation right adrenal mass which may represent an adrenal adenoma. Electronically Signed   By: Virgina Norfolk M.D.   On: 08/17/2019 17:05   DG Abd Portable 1V-Small Bowel Protocol-Position Verification  Result Date: 08/17/2019 CLINICAL DATA:  NG tube placement EXAM: PORTABLE ABDOMEN - 1 VIEW COMPARISON:  None. FINDINGS: Evaluation is limited by patient body habitus. The enteric tube appears to course into the gastric body and is kinked with the tip terminating near the GE junction. IMPRESSION: Enteric tube as above. The tube is likely kinked in the gastric body with the tip terminating near the GE junction. Repositioning is recommended. Electronically Signed   By: Constance Holster M.D.   On: 08/17/2019 18:36    Chart has been reviewed    Assessment/Plan   69 y.o. female with medical history significant of positive for COVID 04/14/1941, chronic systolic CHF, uncontrolled diabetes type 2, CKD stage IIIb, hypothyroidism, asthma/COPD chronic chronic pain, status post AICD  Admitted for SBO  Present on Admission: . SBO (small bowel obstruction) (HCC) -  Likely cause adhesions,   - admit - appreciate General surgery consult.  - NG tube - NPO - KUB in AM  . Essential hypertension - hold home meds while NPO  . Morbid obesity ([place]) - follow up as an outpatient with nutrition  . Automatic implantable cardioverter-defibrillator in situ - stable   . Chronic systolic heart failure (HCC) - - currently appears to be slightly on the dry side, hold home diuretics for tonight and restart when appears euvolemic, carefuly follow fluid status and Cr  . HLD (hyperlipidemia) - chronic stable restart home meds when able to tolerate  . Hyperkalemia - hx  of the same was treated in ER, will repeat and follow. In ER was given IV fluids too, no ECG changes   .  Hyponatremia - mild pt on diuretics with diminished PO intake, hold diuretics fo tonight, check urine electrolytes Follow Na  . COVID-19 virus infection - reports mild cough, in ER O2 saturation at 93-94% pt is around her day 10 since testing positive, already sp remdesivir could not tolerate steroid due to hyperglycemia. Will obtain CXR and inflammatory markers. PCR currently negative, hopefully in recovery phase Cont precautions until 08/29/2019  . pyuria vs UTI - urine culture obtain, in Er already given antibiotics for now will cont and await result of Urine cult Symptoms more in the setting of SBO  Dm 2-  - Order Sensitive  SSI   -  home insulin regimen   switch to   Lantus  50  Units BID  During last hospitalization needed up to 110 units BID but was on steroids,  -  check TSH and HgA1C  - Hold by mouth medications , diabetes coordinator consult   CKD stage IIIb - avoid nephrotoxic medicaitons  Prolonged Qt - - will monitor on tele avoid QT prolonging medications, rehydrate correct electrolytes    Other plan as per orders.  DVT prophylaxis:  SCD     Code Status:  FULL CODE as per patient   I had personally discussed CODE STATUS with patient   Family Communication:   Family not at  Bedside    Disposition Plan:     To home once workup is complete and patient is stable                      Consults called:  General surery   Admission status:  ED Disposition    ED Disposition Condition [place]: [place] [[id number]]  Level of Care: Telemetry Medical [[id number]]  Covid Evaluation: Confirmed COVID Positive  Diagnosis: SBO (small bowel obstruction) ([place]) [[id number]]  Admitting Physician: Toy Baker [[id number]]  Attending Physician: Toy Baker [[id number]]  Estimated length of stay: 3 - 4 days  Certification:: I certify this patient  will need inpatient services for at least 2 midnights       inpatient     Expect 2 midnight stay secondary to severity of patient's current illness including   hemodynamic instability despite optimal treatment ( hypotension hypoxia,  )   Severe lab/radiological/exam abnormalities including:    SBO  and extensive comorbidities including:  DM2   CHF  COPD/asthma  Morbid Obesity   CKD   That are currently affecting medical management.   I expect  patient to be hospitalized for 2 midnights requiring inpatient medical care.  Patient is at high risk for adverse outcome (such as loss of life or disability) if not treated.  Indication for inpatient stay as follows:    inability to maintain oral hydration    Need for operative/procedural  intervention   Need for IV antibiotics,     Level of care    tele  Fo 24H     Precautions: admitted as  covid positive Airborne and Contact precautions    PPE: Used by the provider:   P100  eye Goggles,  Gloves  gown     D'Arcy Abraha 08/17/2019, 9:08 PM    [facility]     after 2 AM please page floor coverage PA If 7AM-7PM, please contact the day team taking care of the patient using [email or web address]   Patient was evaluated in the context of the global COVID-19 pandemic, which necessitated consideration that the patient might  be at risk for infection with the SARS-CoV-2 virus that causes COVID-19. Institutional protocols and algorithms that pertain to the evaluation of patients at risk for COVID-19 are in a state of rapid change based on information released by regulatory bodies including the CDC and federal and state organizations. These policies and algorithms were followed during the patient's care.

## 2019-08-18 ENCOUNTER — Inpatient Hospital Stay (HOSPITAL_COMMUNITY): Payer: Medicare Other

## 2019-08-18 DIAGNOSIS — E1365 Other specified diabetes mellitus with hyperglycemia: Secondary | ICD-10-CM

## 2019-08-18 DIAGNOSIS — N184 Chronic kidney disease, stage 4 (severe): Secondary | ICD-10-CM

## 2019-08-18 DIAGNOSIS — R1084 Generalized abdominal pain: Secondary | ICD-10-CM

## 2019-08-18 DIAGNOSIS — K56609 Unspecified intestinal obstruction, unspecified as to partial versus complete obstruction: Secondary | ICD-10-CM

## 2019-08-18 LAB — BASIC METABOLIC PANEL
Anion gap: 10 (ref 5–15)
BUN: 48 mg/dL — ABNORMAL HIGH (ref 8–23)
CO2: 25 mmol/L (ref 22–32)
Calcium: 9.6 mg/dL (ref 8.9–10.3)
Chloride: 95 mmol/L — ABNORMAL LOW (ref 98–111)
Creatinine, Ser: 2.07 mg/dL — ABNORMAL HIGH (ref 0.44–1.00)
GFR calc Af Amer: 28 mL/min — ABNORMAL LOW (ref >60)
GFR calc non Af Amer: 24 mL/min — ABNORMAL LOW (ref >60)
Glucose, Bld: 248 mg/dL — ABNORMAL HIGH (ref 70–99)
Potassium: 5.1 mmol/L (ref 3.5–5.1)
Sodium: 130 mmol/L — ABNORMAL LOW (ref 135–145)

## 2019-08-18 LAB — COMPREHENSIVE METABOLIC PANEL
ALT: 30 U/L (ref 0–44)
AST: 25 U/L (ref 15–41)
Albumin: 3.3 g/dL — ABNORMAL LOW (ref 3.5–5.0)
Alkaline Phosphatase: 115 U/L (ref 38–126)
Anion gap: 12 (ref 5–15)
BUN: 52 mg/dL — ABNORMAL HIGH (ref 8–23)
CO2: 24 mmol/L (ref 22–32)
Calcium: 9.9 mg/dL (ref 8.9–10.3)
Chloride: 95 mmol/L — ABNORMAL LOW (ref 98–111)
Creatinine, Ser: 2.19 mg/dL — ABNORMAL HIGH (ref 0.44–1.00)
GFR calc Af Amer: 26 mL/min — ABNORMAL LOW (ref >60)
GFR calc non Af Amer: 22 mL/min — ABNORMAL LOW (ref >60)
Glucose, Bld: 251 mg/dL — ABNORMAL HIGH (ref 70–99)
Potassium: 5.2 mmol/L — ABNORMAL HIGH (ref 3.5–5.1)
Sodium: 131 mmol/L — ABNORMAL LOW (ref 135–145)
Total Bilirubin: 1.4 mg/dL — ABNORMAL HIGH (ref 0.3–1.2)
Total Protein: 7.7 g/dL (ref 6.5–8.1)

## 2019-08-18 LAB — CBC
HCT: 35.4 % — ABNORMAL LOW (ref 36.0–46.0)
Hemoglobin: 11.3 g/dL — ABNORMAL LOW (ref 12.0–15.0)
MCH: 26 pg (ref 26.0–34.0)
MCHC: 31.9 g/dL (ref 30.0–36.0)
MCV: 81.4 fL (ref 80.0–100.0)
Platelets: 317 10*3/uL (ref 150–400)
RBC: 4.35 MIL/uL (ref 3.87–5.11)
RDW: 16.2 % — ABNORMAL HIGH (ref 11.5–15.5)
WBC: 11.7 10*3/uL — ABNORMAL HIGH (ref 4.0–10.5)
nRBC: 0 % (ref 0.0–0.2)

## 2019-08-18 LAB — URINE CULTURE

## 2019-08-18 LAB — GLUCOSE, CAPILLARY
Glucose-Capillary: 135 mg/dL — ABNORMAL HIGH (ref 70–99)
Glucose-Capillary: 187 mg/dL — ABNORMAL HIGH (ref 70–99)
Glucose-Capillary: 192 mg/dL — ABNORMAL HIGH (ref 70–99)
Glucose-Capillary: 210 mg/dL — ABNORMAL HIGH (ref 70–99)
Glucose-Capillary: 227 mg/dL — ABNORMAL HIGH (ref 70–99)
Glucose-Capillary: 251 mg/dL — ABNORMAL HIGH (ref 70–99)

## 2019-08-18 LAB — D-DIMER, QUANTITATIVE: D-Dimer, Quant: 0.86 ug/mL-FEU — ABNORMAL HIGH (ref 0.00–0.50)

## 2019-08-18 LAB — FIBRINOGEN: Fibrinogen: 599 mg/dL — ABNORMAL HIGH (ref 210–475)

## 2019-08-18 LAB — MAGNESIUM: Magnesium: 2.6 mg/dL — ABNORMAL HIGH (ref 1.7–2.4)

## 2019-08-18 LAB — PHOSPHORUS: Phosphorus: 3.8 mg/dL (ref 2.5–4.6)

## 2019-08-18 LAB — TSH: TSH: 2.438 u[IU]/mL (ref 0.350–4.500)

## 2019-08-18 MED ORDER — METOPROLOL TARTRATE 5 MG/5ML IV SOLN
5.0000 mg | Freq: Four times a day (QID) | INTRAVENOUS | Status: DC
  Administered 2019-08-19 (×2): 5 mg via INTRAVENOUS
  Filled 2019-08-18 (×3): qty 5

## 2019-08-18 MED ORDER — INSULIN GLARGINE 100 UNIT/ML ~~LOC~~ SOLN
27.0000 [IU] | Freq: Two times a day (BID) | SUBCUTANEOUS | Status: DC
  Administered 2019-08-19 – 2019-08-21 (×6): 27 [IU] via SUBCUTANEOUS
  Filled 2019-08-18 (×8): qty 0.27

## 2019-08-18 MED ORDER — HEPARIN SODIUM (PORCINE) 5000 UNIT/ML IJ SOLN
5000.0000 [IU] | Freq: Three times a day (TID) | INTRAMUSCULAR | Status: DC
  Administered 2019-08-18 – 2019-08-21 (×10): 5000 [IU] via SUBCUTANEOUS
  Filled 2019-08-18 (×10): qty 1

## 2019-08-18 MED ORDER — INSULIN GLARGINE 100 UNIT/ML ~~LOC~~ SOLN
55.0000 [IU] | Freq: Two times a day (BID) | SUBCUTANEOUS | Status: DC
  Filled 2019-08-18: qty 0.55

## 2019-08-18 MED ORDER — SODIUM CHLORIDE 0.9 % IV SOLN: INTRAVENOUS | Status: DC

## 2019-08-18 MED ORDER — INSULIN GLARGINE 100 UNIT/ML ~~LOC~~ SOLN: 27.0000 [IU] | Freq: Two times a day (BID) | SUBCUTANEOUS | Status: DC

## 2019-08-18 NOTE — Progress Notes (Signed)
No nausea or vomiting since NGT has been clamped.

## 2019-08-18 NOTE — Progress Notes (Signed)
PROGRESS NOTE    Crystal [name]  RSW:[id number] DOB: 1950/09/25 DOA: 08/17/2019 PCP: Crist Infante, MD  Brief Narrative:Crystal [name] is a 69 y.o. female with medical history significant of positive for COVID 0/93/8182, chronic systolic CHF, uncontrolled diabetes type 2, CKD stage IIIb, hypothyroidism, asthma/COPD chronic chronic pain, status post AICD  Known hx of recurrent bowel obstruction needing operative repair sp colostomy and colostomy take down by Dr. Hulen Skains presented to the ED 1/23 with lower abdominal pain, nausea vomiting, imaging was concerning for distal small bowel obstruction secondary to ventral hernia -she was recently admitted for Covid infection from 13th-16th of January 2021 she was treated with Decadron and remdesivir  -NG tube placed  Assessment & Plan:   Recurrent small bowel obstruction -Likely secondary to additions and hernia -General surgery consulting -Continue NG decompression, n.p.o., IV fluids -Small bowel protocol, follow KUB  CKD 4 -Baseline creatinine around 1.5-2 -Creatinine stable at baseline, continue IV fluids at low rate today given n.p.o. status  Chronic systolic and diastolic CHF -Last echo 9/93 with EF of 25 to 30% -Nonischemic cardiomyopathy per cardiology notes, with AICD -Carvedilol on hold, start IV metoprolol, holding many cardiac meds while n.p.o. 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 -CT notes incidental right adrenal mass which is stable, suspected to represent adrenal adenoma,  follow-up with PCP for this   DVT prophylaxis: Heparin subcutaneous Code Status: Full code Family Communication: No family at bedside, patient updated Disposition Plan: Home pending resolution of SBO  Consultants:   General surgery   Procedures:   Antimicrobials:    Subjective: -Feels a little better, has NG tube to suction  Objective: Vitals:   08/17/19 2100 08/17/19 2303 08/18/19 0525 08/18/19 0900  BP: 109/79 (!) 141/63 117/63   Pulse: 78 86 75   Resp: 14 18 15    Temp:  99.8 F (37.7 C) 98.3 F (36.8 C)   TempSrc:  Oral Oral   SpO2: 94% 95% 96% 92%  Weight:      Height:        Intake/Output Summary (Last 24 hours) at 08/18/2019 1159 Last data filed at 08/18/2019 7169 Gross per 24 hour  Intake 715 ml  Output 300 ml  Net 415 ml   Filed Weights   08/17/19 1255  Weight: 105.2 kg    Examination:  General exam: Obese pleasant female sitting up in bed, AAOx3, no distress  respiratory system: Diminished breath sounds the bases, otherwise clear Cardiovascular system: S1 & S2 heard, RRR. Gastrointestinal system: Soft, mildly distended, nontender, bowel sounds diminished Central nervous system: Alert and oriented. No focal neurological deficits. Extremities: No edema Skin: No rashes Psychiatry: Judgement and insight appear normal. Mood & affect appropriate.     Data Reviewed:   CBC: Recent Labs  Lab 08/17/19 1232 08/18/19 0245  WBC 11.0* 11.7*  HGB 11.7* 11.3*  HCT 37.1 35.4*  MCV 82.8 81.4  PLT 344 678   Basic Metabolic Panel: Recent Labs  Lab 08/17/19 1232 08/17/19 2001 08/17/19 2354 08/18/19 0245  NA 131* 128* 130* 131*  K 5.5* 5.8* 5.1 5.2*  CL 94* 94* 95* 95*  CO2 24 25 25 24   GLUCOSE 314* 279* 248* 251*  BUN 50* 49* 48* 52*  CREATININE 1.98* 1.97* 2.07* 2.19*  CALCIUM 9.9 9.5 9.6 9.9  MG  --   --   --  2.6*  PHOS  --   --   --  3.8   GFR: Estimated Creatinine Clearance: 29.2 mL/min (A) (by C-G formula based on SCr of 2.19 mg/dL  (H)). Liver Function Tests: Recent Labs  Lab 08/17/19 1232 08/18/19 0245  AST 34 25  ALT 36 30  ALKPHOS 128* 115  BILITOT 1.4* 1.4*  PROT 7.8 7.7  ALBUMIN 3.4* 3.3*   Recent Labs  Lab 08/17/19 1232  LIPASE 31   No results for input(s): AMMONIA in the last 168 hours. Coagulation Profile: No results for input(s): INR, PROTIME in the last 168 hours. Cardiac Enzymes: Recent Labs  Lab 08/17/19 2053  CKTOTAL 45   BNP (last 3 results) No results for input(s): PROBNP in the last 8760 hours. HbA1C: Recent Labs    08/17/19 1232  HGBA1C 9.1*   CBG: Recent Labs  Lab 08/17/19 2114 08/18/19 0029 08/18/19 0523 08/18/19 0808 08/18/19 1127  GLUCAP 248* 251* 227* 192* 210*   Lipid Profile: No results for input(s): CHOL, HDL, LDLCALC, TRIG, CHOLHDL, LDLDIRECT in the last 72 hours. Thyroid Function Tests: Recent Labs    08/18/19 0245  TSH 2.438   Anemia Panel: No results for input(s): VITAMINB12, FOLATE, FERRITIN, TIBC, IRON, RETICCTPCT in the last 72 hours. Urine analysis:    Component Value Date/Time   COLORURINE YELLOW 08/17/2019 1349   APPEARANCEUR CLOUDY (A) 08/17/2019 1349   LABSPEC 1.014 08/17/2019 1349   PHURINE 5.0 08/17/2019 1349   GLUCOSEU NEGATIVE 08/17/2019 1349   HGBUR NEGATIVE 08/17/2019 1349   [place] 08/17/2019 1349   KETONESUR NEGATIVE 08/17/2019 1349   PROTEINUR 30 (A) 08/17/2019 1349   NITRITE NEGATIVE 08/17/2019 1349   LEUKOCYTESUR LARGE (A) 08/17/2019 1349   Sepsis Labs: @LABRCNTIP (procalcitonin:4,lacticidven:4)  ) Recent Results (from the past 240 hour(s))  Urine Culture     Status: Abnormal   Collection Time: 08/17/19  4:14 PM   Specimen: Urine, Random  Result Value Ref Range Status   Specimen Description URINE, RANDOM  Final   Special Requests   Final    NONE Performed at [facility], [place] [address] [place], [place] [address]    Culture MULTIPLE SPECIES PRESENT, SUGGEST RECOLLECTION (A)  Final   Report  Status 08/18/2019 FINAL  Final  Respiratory Panel by RT PCR (Flu A&B, Covid) - Nasopharyngeal Swab     Status: None   Collection Time: 08/17/19  6:38 PM   Specimen: Nasopharyngeal Swab  Result Value Ref Range Status   SARS Coronavirus 2 by RT PCR NEGATIVE NEGATIVE Final    Comment: (NOTE) SARS-CoV-2 target nucleic acids are NOT DETECTED. The SARS-CoV-2 RNA is generally detectable in upper respiratoy specimens during the acute phase of infection. The lowest concentration of SARS-CoV-2 viral copies this assay can detect is 131 copies/mL. A negative result does not preclude SARS-Cov-2 infection and should not be used as the sole basis for treatment or other patient management decisions. A negative result may occur with  improper specimen collection/handling, submission of specimen other than nasopharyngeal swab, presence of viral mutation(s) within the areas targeted by this assay, and inadequate number of viral copies (<131 copies/mL). A negative result must be combined with clinical observations, patient history, and epidemiological information. The  expected result is Negative. Fact Sheet for Patients:  PinkCheek.be Fact Sheet for Healthcare Providers:  GravelBags.it This test is not yet ap proved or cleared by the [place] FDA and  has been authorized for detection and/or diagnosis of SARS-CoV-2 by FDA under an Emergency Use Authorization (EUA). This EUA will remain  in effect (meaning this test can be used) for the duration of the COVID-19 declaration under Section 564(b)(1) of the Act, 21 U.S.C. section 360bbb-3(b)(1), unless the authorization is terminated or revoked sooner.    Influenza A by PCR NEGATIVE NEGATIVE Final   Influenza B by PCR NEGATIVE NEGATIVE Final    Comment: (NOTE) The Xpert Xpress SARS-CoV-2/FLU/RSV assay is intended as an aid in  the diagnosis of influenza from Nasopharyngeal swab specimens and    should not be used as a sole basis for treatment. Nasal washings and  aspirates are unacceptable for Xpert Xpress SARS-CoV-2/FLU/RSV  testing. Fact Sheet for Patients: PinkCheek.be Fact Sheet for Healthcare Providers: GravelBags.it This test is not yet approved or cleared by the [place] FDA and  has been authorized for detection and/or diagnosis of SARS-CoV-2 by  FDA under an Emergency Use Authorization (EUA). This EUA will remain  in effect (meaning this test can be used) for the duration of the  Covid-19 declaration under Section 564(b)(1) of the Act, 21  U.S.C. section 360bbb-3(b)(1), unless the authorization is  terminated or revoked. Performed at [facility], [place] [address], [place], [place] [address]          Radiology Studies: CT ABDOMEN PELVIS WO CONTRAST  Result Date: 08/17/2019 CLINICAL DATA:  Lower abdominal pain with nausea. EXAM: CT ABDOMEN AND PELVIS WITHOUT CONTRAST TECHNIQUE: Multidetector CT imaging of the abdomen and pelvis was performed following the standard protocol without IV contrast. COMPARISON:  April 17, 2018 FINDINGS: Lower chest: Small patchy areas of atelectasis and/or infiltrate are seen within the bilateral lung bases. Hepatobiliary: No focal liver abnormality is seen. A subcentimeter gallstone is seen within the lumen of an otherwise normal-appearing gallbladder. Pancreas: Unremarkable. No pancreatic ductal dilatation or surrounding inflammatory changes. Spleen: Normal in size without focal abnormality. Adrenals/Urinary Tract: There is a stable 1.9 cm x 1.5 cm low-attenuation right adrenal mass. The left adrenal gland is normal in appearance. Kidneys are normal, without renal calculi, focal lesion, or hydronephrosis. Bladder is unremarkable. Stomach/Bowel: Stomach is within normal limits. Surgically anastomosed bowel is seen within the anterior aspect of the lower abdomen, along the  midline. Multiple dilated small bowel loops are seen (maximum small bowel diameter of approximately 3.8 cm). These dilated loops extend into a 3.8 cm x 2.0 cm ventral hernia seen along the lower pelvic wall, to the left of midline (axial CT image 77, CT series number 3). An additional 5.9 cm x 4.5 cm ventral hernia is seen along the lower pelvic wall on the right. This contains a dilated loop of small bowel. The loop of small bowel that exits this hernia is normal in caliber (axial CT images 68 through 80, CT series number 3). Vascular/Lymphatic: Moderate severity aortic atherosclerosis. No enlarged abdominal or pelvic lymph nodes. Reproductive: Status post hysterectomy. No adnexal masses. Other: No abdominopelvic ascites. Musculoskeletal: Multilevel degenerative changes seen throughout the lumbar spine. IMPRESSION: 1. Distal small bowel obstruction secondary to the presence of ventral hernia located within the anterior aspect of the lower pelvic wall on the right. 2. Cholelithiasis. 3. Stable low-attenuation right adrenal mass which may represent an adrenal adenoma. Electronically Signed  By: Virgina Norfolk M.D.   On: 08/17/2019 17:05   DG CHEST PORT 1 VIEW  Result Date: 08/17/2019 CLINICAL DATA:  Cough, abdominal pain, COVID-19 positive EXAM: PORTABLE CHEST 1 VIEW COMPARISON:  Portable exam 2051 hours compared to 08/07/2019 FINDINGS: Nasogastric tube extends into abdomen. LEFT subclavian pacemaker/ICD leads unchanged. Enlargement of cardiac silhouette. Mediastinal contours and pulmonary vascularity normal. Atherosclerotic calcification aorta. Patchy RIGHT basilar infiltrate question atelectasis versus pneumonia. Remaining lungs clear. No pleural effusion or pneumothorax. IMPRESSION: Atelectasis versus infiltrate at RIGHT base. Aortic Atherosclerosis (ICD10-I70.0). Electronically Signed   By: Lavonia Dana M.D.   On: 08/17/2019 21:00   DG Abd Portable 1V-Small Bowel Obstruction Protocol-initial, 8 hr  delay  Result Date: 08/18/2019 CLINICAL DATA:  Small bowel obstruction. Assess migration of contrast material. Status post subtotal colectomy and ileal rectal anastomosis. EXAM: PORTABLE ABDOMEN - 1 VIEW COMPARISON:  CT scan 08/17/2019 FINDINGS: Contrast material is contained within small bowel loops of the right and central pelvis. This is probably still in the distal small bowel and although projecting low in the anatomic pelvis, review of the CT scan from yesterday shows marked laxity of the anterior abdominal wall fascia with small bowel loops projecting down in front of the urinary bladder. IMPRESSION: Contrast has migrated into small bowel loops of the pelvis although given the laxity of the anterior abdominal wall and position of small-bowel as noted on yesterday's CT scan, definite localization of the contrast is not possible. Lateral projection may prove helpful to further evaluate. NG tube tip is in the distal stomach. Electronically Signed   By: Misty Stanley M.D.   On: 08/18/2019 08:34   DG Abd Portable 1V-Small Bowel Protocol-Position Verification  Result Date: 08/17/2019 CLINICAL DATA:  NG tube placement EXAM: PORTABLE ABDOMEN - 1 VIEW COMPARISON:  None. FINDINGS: Evaluation is limited by patient body habitus. The enteric tube appears to course into the gastric body and is kinked with the tip terminating near the GE junction. IMPRESSION: Enteric tube as above. The tube is likely kinked in the gastric body with the tip terminating near the GE junction. Repositioning is recommended. Electronically Signed   By: Constance Holster M.D.   On: 08/17/2019 18:36        Scheduled Meds: . insulin aspart  0-9 Units Subcutaneous Q4H  . insulin glargine  50 Units Subcutaneous BID  . ipratropium  2 puff Inhalation Q6H  . sodium chloride flush  3 mL Intravenous Once   Continuous Infusions: . cefTRIAXone (ROCEPHIN)  IV       LOS: 1 day    Time spent: 37min  Domenic Polite, MD [facility]  08/18/2019, 11:59 AM

## 2019-08-18 NOTE — Plan of Care (Signed)

## 2019-08-18 NOTE — Progress Notes (Signed)
NGT clamped

## 2019-08-18 NOTE — Progress Notes (Signed)
Patient ambulating in hallway and sitting in the chair this am.

## 2019-08-19 DIAGNOSIS — R110 Nausea: Secondary | ICD-10-CM

## 2019-08-19 LAB — GLUCOSE, CAPILLARY
Glucose-Capillary: 117 mg/dL — ABNORMAL HIGH (ref 70–99)
Glucose-Capillary: 136 mg/dL — ABNORMAL HIGH (ref 70–99)
Glucose-Capillary: 164 mg/dL — ABNORMAL HIGH (ref 70–99)
Glucose-Capillary: 217 mg/dL — ABNORMAL HIGH (ref 70–99)
Glucose-Capillary: 241 mg/dL — ABNORMAL HIGH (ref 70–99)
Glucose-Capillary: 242 mg/dL — ABNORMAL HIGH (ref 70–99)

## 2019-08-19 LAB — CBC
HCT: 35.8 % — ABNORMAL LOW (ref 36.0–46.0)
Hemoglobin: 11.3 g/dL — ABNORMAL LOW (ref 12.0–15.0)
MCH: 26.4 pg (ref 26.0–34.0)
MCHC: 31.6 g/dL (ref 30.0–36.0)
MCV: 83.6 fL (ref 80.0–100.0)
Platelets: 303 10*3/uL (ref 150–400)
RBC: 4.28 MIL/uL (ref 3.87–5.11)
RDW: 16.5 % — ABNORMAL HIGH (ref 11.5–15.5)
WBC: 9.5 10*3/uL (ref 4.0–10.5)
nRBC: 0 % (ref 0.0–0.2)

## 2019-08-19 LAB — COMPREHENSIVE METABOLIC PANEL
ALT: 27 U/L (ref 0–44)
AST: 25 U/L (ref 15–41)
Albumin: 3 g/dL — ABNORMAL LOW (ref 3.5–5.0)
Alkaline Phosphatase: 96 U/L (ref 38–126)
Anion gap: 13 (ref 5–15)
BUN: 49 mg/dL — ABNORMAL HIGH (ref 8–23)
CO2: 23 mmol/L (ref 22–32)
Calcium: 9.2 mg/dL (ref 8.9–10.3)
Chloride: 103 mmol/L (ref 98–111)
Creatinine, Ser: 2.15 mg/dL — ABNORMAL HIGH (ref 0.44–1.00)
GFR calc Af Amer: 26 mL/min — ABNORMAL LOW (ref >60)
GFR calc non Af Amer: 23 mL/min — ABNORMAL LOW (ref >60)
Glucose, Bld: 120 mg/dL — ABNORMAL HIGH (ref 70–99)
Potassium: 4.2 mmol/L (ref 3.5–5.1)
Sodium: 139 mmol/L (ref 135–145)
Total Bilirubin: 0.5 mg/dL (ref 0.3–1.2)
Total Protein: 6.8 g/dL (ref 6.5–8.1)

## 2019-08-19 MED ORDER — PANTOPRAZOLE SODIUM 40 MG PO TBEC
40.0000 mg | DELAYED_RELEASE_TABLET | Freq: Every day | ORAL | Status: DC
  Administered 2019-08-19 – 2019-08-21 (×3): 40 mg via ORAL
  Filled 2019-08-19 (×3): qty 1

## 2019-08-19 MED ORDER — CARVEDILOL 25 MG PO TABS
25.0000 mg | ORAL_TABLET | Freq: Two times a day (BID) | ORAL | Status: DC
  Administered 2019-08-19 – 2019-08-21 (×5): 25 mg via ORAL
  Filled 2019-08-19 (×5): qty 1

## 2019-08-19 MED ORDER — ALLOPURINOL 100 MG PO TABS
100.0000 mg | ORAL_TABLET | Freq: Every day | ORAL | Status: DC
  Administered 2019-08-19 – 2019-08-21 (×3): 100 mg via ORAL
  Filled 2019-08-19 (×3): qty 1

## 2019-08-19 MED ORDER — ROSUVASTATIN CALCIUM 5 MG PO TABS
10.0000 mg | ORAL_TABLET | Freq: Every day | ORAL | Status: DC
  Administered 2019-08-19 – 2019-08-20 (×2): 10 mg via ORAL
  Filled 2019-08-19 (×2): qty 2

## 2019-08-19 MED ORDER — DULOXETINE HCL 30 MG PO CPEP
30.0000 mg | ORAL_CAPSULE | Freq: Every day | ORAL | Status: DC
  Administered 2019-08-19 – 2019-08-21 (×3): 30 mg via ORAL
  Filled 2019-08-19 (×3): qty 1

## 2019-08-19 MED ORDER — GABAPENTIN 300 MG PO CAPS
300.0000 mg | ORAL_CAPSULE | Freq: Every day | ORAL | Status: DC
  Administered 2019-08-19 – 2019-08-20 (×2): 300 mg via ORAL
  Filled 2019-08-19 (×2): qty 1

## 2019-08-19 MED ORDER — LEVOTHYROXINE SODIUM 88 MCG PO TABS
88.0000 ug | ORAL_TABLET | Freq: Every day | ORAL | Status: DC
  Administered 2019-08-19 – 2019-08-21 (×3): 88 ug via ORAL
  Filled 2019-08-19 (×3): qty 1

## 2019-08-19 NOTE — Progress Notes (Signed)
PROGRESS NOTE    Crystal [name]  HWE:[id number] DOB: 02-24-1951 DOA: 08/17/2019 PCP: Crist Infante, MD  Brief Narrative:Crystal [name] is a 69 y.o. female with medical history significant of positive for COVID 8/93/8101, chronic systolic CHF, uncontrolled diabetes type 2, CKD stage IIIb, hypothyroidism, asthma/COPD chronic chronic pain, status post AICD  Known hx of recurrent bowel obstruction needing operative repair sp colostomy and colostomy take down by Dr. Hulen Skains presented to the ED 1/23 with lower abdominal pain, nausea vomiting, imaging was concerning for distal small bowel obstruction secondary to ventral hernia -she was recently admitted for Covid infection from 13th-16th of January 2021 she was treated with Decadron and remdesivir  -NG tube placed, improving with conservative management  Assessment & Plan:   Recurrent small bowel obstruction -Likely secondary to additions and hernia -General surgery following -Improving with bowel rest, NG decompression, having bowel movements now  -Tolerated clamping NG tube yesterday, plan to start liquids and remove NG tube  -Ambulate   CKD 4 -Baseline creatinine around 1.5-2 -Creatinine stable at baseline, stop IV fluids today as she will be starting p.o.  Chronic systolic and diastolic CHF -Last echo 7/51 with EF of 25 to 30% -Nonischemic cardiomyopathy per cardiology notes, with AICD -Restart carvedilol, holding torsemide  Recent COVID-19 infection -Tested positive for Covid on 1/13, continue isolation until 2/3  UTI versus asymptomatic bacteriuria -Urine culture with multiple flora, discontinue antibiotics today    Morbid obesity ([place]) -BMI 38  Type 2 diabetes mellitus -CBG stable continue Lantus and sliding scale, hemoglobin A1c is 9   Mild hyperkalemia -Monitor on telemetry, repeat lab  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 -CT notes incidental right adrenal mass which is stable, suspected to represent adrenal adenoma, follow-up with  PCP for this   DVT prophylaxis: Heparin subcutaneous Code Status: Full code Family Communication: No family at bedside, patient updated Disposition Plan: Home pending resolution of SBO  Consultants:   General surgery   Procedures:   Antimicrobials:    Subjective: -No nausea and vomiting, having bowel movements -Denies any chest pain or dyspnea  Objective: Vitals:   08/18/19 1700 08/18/19 2032 08/19/19 0117 08/19/19 0500  BP:  137/62 120/66 (!) 123/56  Pulse:  86 87 78  Resp:  14 18 13   Temp:  98.1 F (36.7 C)  98.3 F (36.8 C)  TempSrc:  Oral  Oral  SpO2: 91% 93% 94% 96%  Weight:      Height:        Intake/Output Summary (Last 24 hours) at 08/19/2019 1057 Last data filed at 08/19/2019 0400 Gross per 24 hour  Intake 317.26 ml  Output --  Net 317.26 ml   Filed Weights   08/17/19 1255  Weight: 105.2 kg    Examination:  Gen: Obese pleasant female sitting up in bed, AAOx3, no distress HEENT: PERRLA, Neck supple, no JVD Lungs: Diminished breath sounds the bases, otherwise clear CVS: RRR,No Gallops,Rubs or new Murmurs Abd: soft, Non tender, non distended, BS present Extremities: No edema Skin: no new rashes Psychiatry: Judgement and insight appear normal. Mood & affect appropriate.     Data Reviewed:   CBC: Recent Labs  Lab 08/17/19 1232 08/18/19 0245 08/19/19 0418  WBC 11.0* 11.7* 9.5  HGB 11.7* 11.3* 11.3*  HCT 37.1 35.4* 35.8*  MCV 82.8 81.4 83.6  PLT 344 317 025   Basic Metabolic Panel: Recent Labs  Lab 08/17/19 1232 08/17/19 2001 08/17/19 2354 08/18/19 0245 08/19/19 0418  NA 131* 128* 130* 131* 139  K  5.5* 5.8* 5.1 5.2* 4.2  CL 94* 94* 95* 95* 103  CO2 24 25 25 24 23   GLUCOSE 314* 279* 248* 251* 120*  BUN 50* 49* 48* 52* 49*  CREATININE 1.98* 1.97* 2.07* 2.19* 2.15*  CALCIUM 9.9 9.5 9.6 9.9 9.2  MG  --   --   --  2.6*  --   PHOS  --   --   --  3.8  --    GFR: Estimated Creatinine Clearance: 29.7 mL/min (A) (by C-G formula  based on SCr of 2.15 mg/dL (H)). Liver Function Tests: Recent Labs  Lab 08/17/19 1232 08/18/19 0245 08/19/19 0418  AST 34 25 25  ALT 36 30 27  ALKPHOS 128* 115 96  BILITOT 1.4* 1.4* 0.5  PROT 7.8 7.7 6.8  ALBUMIN 3.4* 3.3* 3.0*   Recent Labs  Lab 08/17/19 1232  LIPASE 31   No results for input(s): AMMONIA in the last 168 hours. Coagulation Profile: No results for input(s): INR, PROTIME in the last 168 hours. Cardiac Enzymes: Recent Labs  Lab 08/17/19 2053  CKTOTAL 45   BNP (last 3 results) No results for input(s): PROBNP in the last 8760 hours. HbA1C: Recent Labs    08/17/19 1232  HGBA1C 9.1*   CBG: Recent Labs  Lab 08/18/19 1622 08/18/19 2030 08/19/19 0041 08/19/19 0458 08/19/19 0749  GLUCAP 187* 135* 136* 117* 164*   Lipid Profile: No results for input(s): CHOL, HDL, LDLCALC, TRIG, CHOLHDL, LDLDIRECT in the last 72 hours. Thyroid Function Tests: Recent Labs    08/18/19 0245  TSH 2.438   Anemia Panel: No results for input(s): VITAMINB12, FOLATE, FERRITIN, TIBC, IRON, RETICCTPCT in the last 72 hours. Urine analysis:    Component Value Date/Time   COLORURINE YELLOW 08/17/2019 1349   APPEARANCEUR CLOUDY (A) 08/17/2019 1349   LABSPEC 1.014 08/17/2019 1349   PHURINE 5.0 08/17/2019 1349   GLUCOSEU NEGATIVE 08/17/2019 1349   HGBUR NEGATIVE 08/17/2019 1349   [place] 08/17/2019 1349   KETONESUR NEGATIVE 08/17/2019 1349   PROTEINUR 30 (A) 08/17/2019 1349   NITRITE NEGATIVE 08/17/2019 1349   LEUKOCYTESUR LARGE (A) 08/17/2019 1349   Sepsis Labs: @LABRCNTIP (procalcitonin:4,lacticidven:4)  ) Recent Results (from the past 240 hour(s))  Urine Culture     Status: Abnormal   Collection Time: 08/17/19  4:14 PM   Specimen: Urine, Random  Result Value Ref Range Status   Specimen Description URINE, RANDOM  Final   Special Requests   Final    NONE Performed at [facility], [place] [address], [place], [place] [address]    Culture MULTIPLE  SPECIES PRESENT, SUGGEST RECOLLECTION (A)  Final   Report Status 08/18/2019 FINAL  Final  Respiratory Panel by RT PCR (Flu A&B, Covid) - Nasopharyngeal Swab     Status: None   Collection Time: 08/17/19  6:38 PM   Specimen: Nasopharyngeal Swab  Result Value Ref Range Status   SARS Coronavirus 2 by RT PCR NEGATIVE NEGATIVE Final    Comment: (NOTE) SARS-CoV-2 target nucleic acids are NOT DETECTED. The SARS-CoV-2 RNA is generally detectable in upper respiratoy specimens during the acute phase of infection. The lowest concentration of SARS-CoV-2 viral copies this assay can detect is 131 copies/mL. A negative result does not preclude SARS-Cov-2 infection and should not be used as the sole basis for treatment or other patient management decisions. A negative result may occur with  improper specimen collection/handling, submission of specimen other than nasopharyngeal swab, presence of viral mutation(s) within the areas targeted  by this assay, and inadequate number of viral copies (<131 copies/mL). A negative result must be combined with clinical observations, patient history, and epidemiological information. The expected result is Negative. Fact Sheet for Patients:  PinkCheek.be Fact Sheet for Healthcare Providers:  GravelBags.it This test is not yet ap proved or cleared by the [place] FDA and  has been authorized for detection and/or diagnosis of SARS-CoV-2 by FDA under an Emergency Use Authorization (EUA). This EUA will remain  in effect (meaning this test can be used) for the duration of the COVID-19 declaration under Section 564(b)(1) of the Act, 21 U.S.C. section 360bbb-3(b)(1), unless the authorization is terminated or revoked sooner.    Influenza A by PCR NEGATIVE NEGATIVE Final   Influenza B by PCR NEGATIVE NEGATIVE Final    Comment: (NOTE) The Xpert Xpress SARS-CoV-2/FLU/RSV assay is intended as an aid in  the  diagnosis of influenza from Nasopharyngeal swab specimens and  should not be used as a sole basis for treatment. Nasal washings and  aspirates are unacceptable for Xpert Xpress SARS-CoV-2/FLU/RSV  testing. Fact Sheet for Patients: PinkCheek.be Fact Sheet for Healthcare Providers: GravelBags.it This test is not yet approved or cleared by the [place] FDA and  has been authorized for detection and/or diagnosis of SARS-CoV-2 by  FDA under an Emergency Use Authorization (EUA). This EUA will remain  in effect (meaning this test can be used) for the duration of the  Covid-19 declaration under Section 564(b)(1) of the Act, 21  U.S.C. section 360bbb-3(b)(1), unless the authorization is  terminated or revoked. Performed at [facility], [place] [address], [place], [place] [address]          Radiology Studies: CT ABDOMEN PELVIS WO CONTRAST  Result Date: 08/17/2019 CLINICAL DATA:  Lower abdominal pain with nausea. EXAM: CT ABDOMEN AND PELVIS WITHOUT CONTRAST TECHNIQUE: Multidetector CT imaging of the abdomen and pelvis was performed following the standard protocol without IV contrast. COMPARISON:  April 17, 2018 FINDINGS: Lower chest: Small patchy areas of atelectasis and/or infiltrate are seen within the bilateral lung bases. Hepatobiliary: No focal liver abnormality is seen. A subcentimeter gallstone is seen within the lumen of an otherwise normal-appearing gallbladder. Pancreas: Unremarkable. No pancreatic ductal dilatation or surrounding inflammatory changes. Spleen: Normal in size without focal abnormality. Adrenals/Urinary Tract: There is a stable 1.9 cm x 1.5 cm low-attenuation right adrenal mass. The left adrenal gland is normal in appearance. Kidneys are normal, without renal calculi, focal lesion, or hydronephrosis. Bladder is unremarkable. Stomach/Bowel: Stomach is within normal limits. Surgically anastomosed bowel is  seen within the anterior aspect of the lower abdomen, along the midline. Multiple dilated small bowel loops are seen (maximum small bowel diameter of approximately 3.8 cm). These dilated loops extend into a 3.8 cm x 2.0 cm ventral hernia seen along the lower pelvic wall, to the left of midline (axial CT image 77, CT series number 3). An additional 5.9 cm x 4.5 cm ventral hernia is seen along the lower pelvic wall on the right. This contains a dilated loop of small bowel. The loop of small bowel that exits this hernia is normal in caliber (axial CT images 68 through 80, CT series number 3). Vascular/Lymphatic: Moderate severity aortic atherosclerosis. No enlarged abdominal or pelvic lymph nodes. Reproductive: Status post hysterectomy. No adnexal masses. Other: No abdominopelvic ascites. Musculoskeletal: Multilevel degenerative changes seen throughout the lumbar spine. IMPRESSION: 1. Distal small bowel obstruction secondary to the presence of ventral hernia located within the anterior aspect of  the lower pelvic wall on the right. 2. Cholelithiasis. 3. Stable low-attenuation right adrenal mass which may represent an adrenal adenoma. Electronically Signed   By: Virgina Norfolk M.D.   On: 08/17/2019 17:05   DG CHEST PORT 1 VIEW  Result Date: 08/17/2019 CLINICAL DATA:  Cough, abdominal pain, COVID-19 positive EXAM: PORTABLE CHEST 1 VIEW COMPARISON:  Portable exam 2051 hours compared to 08/07/2019 FINDINGS: Nasogastric tube extends into abdomen. LEFT subclavian pacemaker/ICD leads unchanged. Enlargement of cardiac silhouette. Mediastinal contours and pulmonary vascularity normal. Atherosclerotic calcification aorta. Patchy RIGHT basilar infiltrate question atelectasis versus pneumonia. Remaining lungs clear. No pleural effusion or pneumothorax. IMPRESSION: Atelectasis versus infiltrate at RIGHT base. Aortic Atherosclerosis (ICD10-I70.0). Electronically Signed   By: Lavonia Dana M.D.   On: 08/17/2019 21:00   DG Abd  Portable 1V-Small Bowel Obstruction Protocol-initial, 8 hr delay  Result Date: 08/18/2019 CLINICAL DATA:  Small bowel obstruction. Assess migration of contrast material. Status post subtotal colectomy and ileal rectal anastomosis. EXAM: PORTABLE ABDOMEN - 1 VIEW COMPARISON:  CT scan 08/17/2019 FINDINGS: Contrast material is contained within small bowel loops of the right and central pelvis. This is probably still in the distal small bowel and although projecting low in the anatomic pelvis, review of the CT scan from yesterday shows marked laxity of the anterior abdominal wall fascia with small bowel loops projecting down in front of the urinary bladder. IMPRESSION: Contrast has migrated into small bowel loops of the pelvis although given the laxity of the anterior abdominal wall and position of small-bowel as noted on yesterday's CT scan, definite localization of the contrast is not possible. Lateral projection may prove helpful to further evaluate. NG tube tip is in the distal stomach. Electronically Signed   By: Misty Stanley M.D.   On: 08/18/2019 08:34   DG Abd Portable 1V-Small Bowel Protocol-Position Verification  Result Date: 08/17/2019 CLINICAL DATA:  NG tube placement EXAM: PORTABLE ABDOMEN - 1 VIEW COMPARISON:  None. FINDINGS: Evaluation is limited by patient body habitus. The enteric tube appears to course into the gastric body and is kinked with the tip terminating near the GE junction. IMPRESSION: Enteric tube as above. The tube is likely kinked in the gastric body with the tip terminating near the GE junction. Repositioning is recommended. Electronically Signed   By: Constance Holster M.D.   On: 08/17/2019 18:36        Scheduled Meds: . allopurinol  100 mg Oral Daily  . carvedilol  25 mg Oral BID WC  . DULoxetine  30 mg Oral Daily  . gabapentin  300 mg Oral QHS  . heparin injection (subcutaneous)  5,000 Units Subcutaneous Q8H  . insulin aspart  0-9 Units Subcutaneous Q4H  . insulin  glargine  27 Units Subcutaneous BID  . ipratropium  2 puff Inhalation Q6H  . levothyroxine  88 mcg Oral Q0600  . pantoprazole  40 mg Oral Q1200  . rosuvastatin  10 mg Oral q1800  . sodium chloride flush  3 mL Intravenous Once   Continuous Infusions:    LOS: 2 days    Time spent: 11min  Domenic Polite, MD [facility]  08/19/2019, 10:57 AM

## 2019-08-19 NOTE — Progress Notes (Addendum)
Subjective: CC: SBO Patient reports that the last time she had abdominal pain or nausea was on Friday.  She reports that on Friday she had sharp abdominal pain around her hernia with associated nausea and dry heaves.  Since Saturday she has had some soreness, but no pain, over her hernia and reports that it is mild.  She reports no associated nausea.  NG tube has been clamped last 24 hours.  She is unsure of flatus.  She had 3-4 liquidy bm's yesterday as well as one this morning that was more formed and normal for her. She normally has 5-6 bm's per day. She is ambulating.   Objective: Vital signs in last 24 hours: Temp:  [98.1 F (36.7 C)-98.7 F (37.1 C)] 98.3 F (36.8 C) (01/25 0500) Pulse Rate:  [78-87] 78 (01/25 0500) Resp:  [13-18] 13 (01/25 0500) BP: (120-140)/(56-75) 123/56 (01/25 0500) SpO2:  [91 %-96 %] 96 % (01/25 0500) Last BM Date: 08/18/19  Intake/Output from previous day: 01/24 0701 - 01/25 0700 In: 317.3 [I.V.:217.4; IV Piggyback:99.9] Out: -  Intake/Output this shift: No intake/output data recorded.  PE: Gen:  Alert, NAD, pleasant Pulm: normal rate and effort Abd: Soft, ND, NT, +BS, ventral hernia is soft and easily reducible.  Prior midline and right-sided colostomy scar that are well healed. Psych: A&Ox3  Skin: no rashes noted, warm and dry  Lab Results:  Recent Labs    08/18/19 0245 08/19/19 0418  WBC 11.7* 9.5  HGB 11.3* 11.3*  HCT 35.4* 35.8*  PLT 317 303   BMET Recent Labs    08/18/19 0245 08/19/19 0418  NA 131* 139  K 5.2* 4.2  CL 95* 103  CO2 24 23  GLUCOSE 251* 120*  BUN 52* 49*  CREATININE 2.19* 2.15*  CALCIUM 9.9 9.2   PT/INR No results for input(s): LABPROT, INR in the last 72 hours. CMP     Component Value Date/Time   NA 139 08/19/2019 0418   NA 135 10/08/2018 1259   K 4.2 08/19/2019 0418   CL 103 08/19/2019 0418   CO2 23 08/19/2019 0418   GLUCOSE 120 (H) 08/19/2019 0418   BUN 49 (H) 08/19/2019 0418   BUN 55 (H)  10/08/2018 1259   CREATININE 2.15 (H) 08/19/2019 0418   CREATININE 3.10 (H) 08/17/2017 1545   CALCIUM 9.2 08/19/2019 0418   PROT 6.8 08/19/2019 0418   ALBUMIN 3.0 (L) 08/19/2019 0418   AST 25 08/19/2019 0418   ALT 27 08/19/2019 0418   ALKPHOS 96 08/19/2019 0418   BILITOT 0.5 08/19/2019 0418   GFRNONAA 23 (L) 08/19/2019 0418   GFRNONAA 15 (L) 08/17/2017 1545   GFRAA 26 (L) 08/19/2019 0418   GFRAA 17 (L) 08/17/2017 1545   Lipase     Component Value Date/Time   LIPASE 31 08/17/2019 1232       Studies/Results: CT ABDOMEN PELVIS WO CONTRAST  Result Date: 08/17/2019 CLINICAL DATA:  Lower abdominal pain with nausea. EXAM: CT ABDOMEN AND PELVIS WITHOUT CONTRAST TECHNIQUE: Multidetector CT imaging of the abdomen and pelvis was performed following the standard protocol without IV contrast. COMPARISON:  April 17, 2018 FINDINGS: Lower chest: Small patchy areas of atelectasis and/or infiltrate are seen within the bilateral lung bases. Hepatobiliary: No focal liver abnormality is seen. A subcentimeter gallstone is seen within the lumen of an otherwise normal-appearing gallbladder. Pancreas: Unremarkable. No pancreatic ductal dilatation or surrounding inflammatory changes. Spleen: Normal in size without focal abnormality. Adrenals/Urinary Tract: There is  a stable 1.9 cm x 1.5 cm low-attenuation right adrenal mass. The left adrenal gland is normal in appearance. Kidneys are normal, without renal calculi, focal lesion, or hydronephrosis. Bladder is unremarkable. Stomach/Bowel: Stomach is within normal limits. Surgically anastomosed bowel is seen within the anterior aspect of the lower abdomen, along the midline. Multiple dilated small bowel loops are seen (maximum small bowel diameter of approximately 3.8 cm). These dilated loops extend into a 3.8 cm x 2.0 cm ventral hernia seen along the lower pelvic wall, to the left of midline (axial CT image 77, CT series number 3). An additional 5.9 cm x 4.5 cm  ventral hernia is seen along the lower pelvic wall on the right. This contains a dilated loop of small bowel. The loop of small bowel that exits this hernia is normal in caliber (axial CT images 68 through 80, CT series number 3). Vascular/Lymphatic: Moderate severity aortic atherosclerosis. No enlarged abdominal or pelvic lymph nodes. Reproductive: Status post hysterectomy. No adnexal masses. Other: No abdominopelvic ascites. Musculoskeletal: Multilevel degenerative changes seen throughout the lumbar spine. IMPRESSION: 1. Distal small bowel obstruction secondary to the presence of ventral hernia located within the anterior aspect of the lower pelvic wall on the right. 2. Cholelithiasis. 3. Stable low-attenuation right adrenal mass which may represent an adrenal adenoma. Electronically Signed   By: Virgina Norfolk M.D.   On: 08/17/2019 17:05   DG CHEST PORT 1 VIEW  Result Date: 08/17/2019 CLINICAL DATA:  Cough, abdominal pain, COVID-19 positive EXAM: PORTABLE CHEST 1 VIEW COMPARISON:  Portable exam 2051 hours compared to 08/07/2019 FINDINGS: Nasogastric tube extends into abdomen. LEFT subclavian pacemaker/ICD leads unchanged. Enlargement of cardiac silhouette. Mediastinal contours and pulmonary vascularity normal. Atherosclerotic calcification aorta. Patchy RIGHT basilar infiltrate question atelectasis versus pneumonia. Remaining lungs clear. No pleural effusion or pneumothorax. IMPRESSION: Atelectasis versus infiltrate at RIGHT base. Aortic Atherosclerosis (ICD10-I70.0). Electronically Signed   By: Lavonia Dana M.D.   On: 08/17/2019 21:00   DG Abd Portable 1V-Small Bowel Obstruction Protocol-initial, 8 hr delay  Result Date: 08/18/2019 CLINICAL DATA:  Small bowel obstruction. Assess migration of contrast material. Status post subtotal colectomy and ileal rectal anastomosis. EXAM: PORTABLE ABDOMEN - 1 VIEW COMPARISON:  CT scan 08/17/2019 FINDINGS: Contrast material is contained within small bowel loops of  the right and central pelvis. This is probably still in the distal small bowel and although projecting low in the anatomic pelvis, review of the CT scan from yesterday shows marked laxity of the anterior abdominal wall fascia with small bowel loops projecting down in front of the urinary bladder. IMPRESSION: Contrast has migrated into small bowel loops of the pelvis although given the laxity of the anterior abdominal wall and position of small-bowel as noted on yesterday's CT scan, definite localization of the contrast is not possible. Lateral projection may prove helpful to further evaluate. NG tube tip is in the distal stomach. Electronically Signed   By: Misty Stanley M.D.   On: 08/18/2019 08:34   DG Abd Portable 1V-Small Bowel Protocol-Position Verification  Result Date: 08/17/2019 CLINICAL DATA:  NG tube placement EXAM: PORTABLE ABDOMEN - 1 VIEW COMPARISON:  None. FINDINGS: Evaluation is limited by patient body habitus. The enteric tube appears to course into the gastric body and is kinked with the tip terminating near the GE junction. IMPRESSION: Enteric tube as above. The tube is likely kinked in the gastric body with the tip terminating near the GE junction. Repositioning is recommended. Electronically Signed   By: Constance Holster  M.D.   On: 08/17/2019 18:36    Anti-infectives: Anti-infectives (From admission, onward)   Start     Dose/Rate Route Frequency Ordered Stop   08/18/19 1600  cefTRIAXone (ROCEPHIN) 1 g in sodium chloride 0.9 % 100 mL IVPB     1 g 200 mL/hr over 30 Minutes Intravenous Every 24 hours 08/17/19 2025     08/17/19 1600  cefTRIAXone (ROCEPHIN) 1 g in sodium chloride 0.9 % 100 mL IVPB     1 g 200 mL/hr over 30 Minutes Intravenous  Once 08/17/19 1553 08/17/19 1735       Assessment/Plan CKD CHF DM2 ? UTI  COVID-19+ - Per [facility] -   SBO - D/c NGT - Allow CLD - Keep K>4 and Mg>2 for bowel function - Ambulate for bowel function  FEN - CLD VTE - SCDs,  Heparin ID - Rocephin per [facility]     LOS: 2 days    Jillyn Ledger , [facility] 08/19/2019, 8:28 AM Please see Amion for pager number during day hours 7:00am-4:30pm

## 2019-08-19 NOTE — Progress Notes (Signed)
NGT removed per MD order. Pt tolerated well.

## 2019-08-19 NOTE — Progress Notes (Signed)
Inpatient Diabetes Program Recommendations  AACE/ADA: New Consensus Statement on Inpatient Glycemic Control (2015)  Target Ranges:  Prepandial:   less than 140 mg/dL      Peak postprandial:   less than 180 mg/dL (1-2 hours)      Critically ill patients:  140 - 180 mg/dL   Lab Results  Component Value Date   GLUCAP 242 (H) 08/19/2019   HGBA1C 9.1 (H) 08/17/2019   Spoke with patient regarding outpatient diabetes management. Patient is managed by Dr Joylene Draft and outpatient medications were verified.  Reviewed patient's current A1c of 9.1%. Explained what a A1c is and what it measures. Also reviewed goal A1c with patient, importance of good glucose control @ home, and blood sugar goals. Reviewed patho of DM, need for insulin, current inpatient insulin requirements/dosages compared to outpatient dosages, impact of steroids and infection on glucose trends, vascular changes and comorbidites.  Patient has a meter and checks blood sugars. Patient reports glucose ranges from 150-200's mg/dL.  Encouraged patient to follow up with PCP following hospitalization. Patient has no further questions and feels comfortable with current outpatient regimen.   Thanks, Bronson Curb, MSN, RNC-OB Diabetes Coordinator [phone] (8a-5p)

## 2019-08-19 NOTE — Progress Notes (Signed)
Pt ambulated 200 ft around unit on RA. Pt tolerated well.

## 2019-08-20 LAB — GLUCOSE, CAPILLARY
Glucose-Capillary: 118 mg/dL — ABNORMAL HIGH (ref 70–99)
Glucose-Capillary: 133 mg/dL — ABNORMAL HIGH (ref 70–99)
Glucose-Capillary: 156 mg/dL — ABNORMAL HIGH (ref 70–99)
Glucose-Capillary: 168 mg/dL — ABNORMAL HIGH (ref 70–99)
Glucose-Capillary: 194 mg/dL — ABNORMAL HIGH (ref 70–99)
Glucose-Capillary: 210 mg/dL — ABNORMAL HIGH (ref 70–99)

## 2019-08-20 LAB — BASIC METABOLIC PANEL
Anion gap: 10 (ref 5–15)
BUN: 45 mg/dL — ABNORMAL HIGH (ref 8–23)
CO2: 25 mmol/L (ref 22–32)
Calcium: 9 mg/dL (ref 8.9–10.3)
Chloride: 102 mmol/L (ref 98–111)
Creatinine, Ser: 2.12 mg/dL — ABNORMAL HIGH (ref 0.44–1.00)
GFR calc Af Amer: 27 mL/min — ABNORMAL LOW (ref >60)
GFR calc non Af Amer: 23 mL/min — ABNORMAL LOW (ref >60)
Glucose, Bld: 131 mg/dL — ABNORMAL HIGH (ref 70–99)
Potassium: 4.4 mmol/L (ref 3.5–5.1)
Sodium: 137 mmol/L (ref 135–145)

## 2019-08-20 LAB — CBC
HCT: 34.2 % — ABNORMAL LOW (ref 36.0–46.0)
Hemoglobin: 10.5 g/dL — ABNORMAL LOW (ref 12.0–15.0)
MCH: 25.6 pg — ABNORMAL LOW (ref 26.0–34.0)
MCHC: 30.7 g/dL (ref 30.0–36.0)
MCV: 83.4 fL (ref 80.0–100.0)
Platelets: 276 10*3/uL (ref 150–400)
RBC: 4.1 MIL/uL (ref 3.87–5.11)
RDW: 16.3 % — ABNORMAL HIGH (ref 11.5–15.5)
WBC: 7.8 10*3/uL (ref 4.0–10.5)
nRBC: 0 % (ref 0.0–0.2)

## 2019-08-20 NOTE — Progress Notes (Signed)
Subjective: CC: Abdominal soreness Patient reports that she "feel back to my old self".  She reports no abdominal pain, nausea or vomiting.  She is tolerating clear liquid diet and finished almost her entire tray this morning.  She reports some soreness over her abdomen but reports this is very mild.  She had another bowel movement last night that was normal for her.  She is ambulating.  Objective: Vital signs in last 24 hours: Temp:  [97.6 F (36.4 C)-98.7 F (37.1 C)] 98.7 F (37.1 C) (01/26 0434) Pulse Rate:  [68-79] 68 (01/26 0434) Resp:  [14-16] 14 (01/26 0434) BP: (108-127)/(51-65) 114/51 (01/26 0434) SpO2:  [91 %-100 %] 97 % (01/26 0434) Weight:  [104.2 kg] 104.2 kg (01/26 0432) Last BM Date: 08/19/19  Intake/Output from previous day: 01/25 0701 - 01/26 0700 In: 540 [P.O.:540] Out: -  Intake/Output this shift: No intake/output data recorded.  PE: Gen:  Alert, NAD, pleasant Pulm: normal rate and effort Abd: Soft, ND, NT, +BS, ventral hernia is soft and easily reducible.  Prior midline and right-sided colostomy scar that are well healed. Psych: A&Ox3  Skin: no rashes noted, warm and dry  Lab Results:  Recent Labs    08/19/19 0418 08/20/19 0500  WBC 9.5 7.8  HGB 11.3* 10.5*  HCT 35.8* 34.2*  PLT 303 276   BMET Recent Labs    08/19/19 0418 08/20/19 0500  NA 139 137  K 4.2 4.4  CL 103 102  CO2 23 25  GLUCOSE 120* 131*  BUN 49* 45*  CREATININE 2.15* 2.12*  CALCIUM 9.2 9.0   PT/INR No results for input(s): LABPROT, INR in the last 72 hours. CMP     Component Value Date/Time   NA 137 08/20/2019 0500   NA 135 10/08/2018 1259   K 4.4 08/20/2019 0500   CL 102 08/20/2019 0500   CO2 25 08/20/2019 0500   GLUCOSE 131 (H) 08/20/2019 0500   BUN 45 (H) 08/20/2019 0500   BUN 55 (H) 10/08/2018 1259   CREATININE 2.12 (H) 08/20/2019 0500   CREATININE 3.10 (H) 08/17/2017 1545   CALCIUM 9.0 08/20/2019 0500   PROT 6.8 08/19/2019 0418   ALBUMIN 3.0 (L)  08/19/2019 0418   AST 25 08/19/2019 0418   ALT 27 08/19/2019 0418   ALKPHOS 96 08/19/2019 0418   BILITOT 0.5 08/19/2019 0418   GFRNONAA 23 (L) 08/20/2019 0500   GFRNONAA 15 (L) 08/17/2017 1545   GFRAA 27 (L) 08/20/2019 0500   GFRAA 17 (L) 08/17/2017 1545   Lipase     Component Value Date/Time   LIPASE 31 08/17/2019 1232       Studies/Results: No results found.  Anti-infectives: Anti-infectives (From admission, onward)   Start     Dose/Rate Route Frequency Ordered Stop   08/18/19 1600  cefTRIAXone (ROCEPHIN) 1 g in sodium chloride 0.9 % 100 mL IVPB  Status:  Discontinued     1 g 200 mL/hr over 30 Minutes Intravenous Every 24 hours 08/17/19 2025 08/19/19 1053   08/17/19 1600  cefTRIAXone (ROCEPHIN) 1 g in sodium chloride 0.9 % 100 mL IVPB     1 g 200 mL/hr over 30 Minutes Intravenous  Once 08/17/19 1553 08/17/19 1735       Assessment/Plan CKD CHF DM2 COVID-19+ - Per TRH -   SBO - Adv diet - Keep K>4 and Mg>2 for bowel function - Ambulate for bowel function - Discussed with [facility] that if patient tolerates diet, she could  be discharged from our standpoint as early as later today   FEN - FLD, advance diet as tolerated to soft VTE - SCDs, Heparin subq ID - Rocephin 1/23 - 1/25   LOS: 3 days    Jillyn Ledger , [facility] Surgery 08/20/2019, 8:55 AM Please see Amion for pager number during day hours 7:00am-4:30pm

## 2019-08-20 NOTE — Discharge Instructions (Signed)
Ventral Hernia  A ventral hernia is a bulge of tissue from inside the abdomen that pushes through a weak area of the muscles that form the front wall of the abdomen. The tissues inside the abdomen are inside a sac (peritoneum). These tissues include the small intestine, large intestine, and the fatty tissue that covers the intestines (omentum). Sometimes, the bulge that forms a hernia contains intestines. Other hernias contain only fat. Ventral hernias do not go away without surgical treatment. There are several types of ventral hernias. You may have:  A hernia at an incision site from previous abdominal surgery (incisional hernia).  A hernia just above the belly button (epigastric hernia), or at the belly button (umbilical hernia). These types of hernias can develop from heavy lifting or straining.  A hernia that comes and goes (reducible hernia). It may be visible only when you lift or strain. This type of hernia can be pushed back into the abdomen (reduced).  A hernia that traps abdominal tissue inside the hernia (incarcerated hernia). This type of hernia does not reduce.  A hernia that cuts off blood flow to the tissues inside the hernia (strangulated hernia). The tissues can start to die if this happens. This is a very painful bulge that cannot be reduced. A strangulated hernia is a medical emergency. What are the causes? This condition is caused by abdominal tissue putting pressure on an area of weakness in the abdominal muscles. What increases the risk? The following factors may make you more likely to develop this condition:  Being female.  Being 60 or older.  Being overweight or obese.  Having had previous abdominal surgery, especially if there was an infection after surgery.  Having had an injury to the abdominal wall.  Having had several pregnancies.  Having a buildup of fluid inside the abdomen (ascites). What are the signs or symptoms? The only symptom of a ventral hernia  may be a painless bulge in the abdomen. A reducible hernia may be visible only when you strain, cough, or lift. Other symptoms may include:  Dull pain.  A feeling of pressure. Signs and symptoms of a strangulated hernia may include:  Increasing pain.  Nausea and vomiting.  Pain when pressing on the hernia.  The skin over the hernia turning red or purple.  Constipation.  Blood in the stool (feces). How is this diagnosed? This condition may be diagnosed based on:  Your symptoms.  Your medical history.  A physical exam. You may be asked to cough or strain while standing. These actions increase the pressure inside your abdomen and force the hernia through the opening in your muscles. Your health care provider may try to reduce the hernia by pressing on it.  Imaging studies, such as an ultrasound or CT scan. How is this treated? This condition is treated with surgery. If you have a strangulated hernia, surgery is done as soon as possible. If your hernia is small and not incarcerated, you may be asked to lose some weight before surgery. Follow these instructions at home:  Follow instructions from your health care provider about eating or drinking restrictions.  If you are overweight, your health care provider may recommend that you increase your activity level and eat a healthier diet.  Do not lift anything that is heavier than 10 lb (4.5 kg).  Return to your normal activities as told by your health care provider. Ask your health care provider what activities are safe for you. You may need to avoid activities   that increase pressure on your hernia.  Take over-the-counter and prescription medicines only as told by your health care provider.  Keep all follow-up visits as told by your health care provider. This is important. Contact a health care provider if:  Your hernia gets larger.  Your hernia becomes painful. Get help right away if:  Your hernia becomes increasingly  painful.  You have pain along with any of the following: ? Changes in skin color in the area of the hernia. ? Nausea. ? Vomiting. ? Fever. Summary  A ventral hernia is a bulge of tissue from inside the abdomen that pushes through a weak area of the muscles that form the front wall of the abdomen.  This condition is treated with surgery, which may be urgent depending on your hernia.  Do not lift anything that is heavier than 10 lb (4.5 kg), and follow activity instructions from your health care provider. This information is not intended to replace advice given to you by your health care provider. Make sure you discuss any questions you have with your health care provider. Document Revised: 08/23/2017 Document Reviewed: 01/30/2017 Elsevier Patient Education  2020 [employer]  

## 2019-08-20 NOTE — Progress Notes (Signed)
PROGRESS NOTE    Crystal [name]  XAJ:[id number] DOB: 02-16-1951 DOA: 08/17/2019 PCP: Crist Infante, MD  Brief Narrative:Crystal [name] is a 69 y.o. female with medical history significant of positive for COVID 0/94/7096, chronic systolic CHF, uncontrolled diabetes type 2, CKD stage IIIb, hypothyroidism, asthma/COPD chronic chronic pain, status post AICD  Known hx of recurrent bowel obstruction needing operative repair sp colostomy and colostomy take down by Dr. Hulen Skains presented to the ED 1/23 with lower abdominal pain, nausea vomiting, imaging was concerning for distal small bowel obstruction secondary to ventral hernia -she was recently admitted for Covid infection from 13th-16th of January 2021 she was treated with Decadron and remdesivir  -NG tube placed, improving with conservative management  Assessment & Plan:   Recurrent small bowel obstruction -Likely secondary to additions and hernia -General surgery following -Improving with bowel rest, NG decompression, having bowel movements now  -NG tube removed yesterday afternoon, tolerating clears -Being advanced to full liquids and hopefully soft diet later today -Ambulate -Home tomorrow if stable  CKD 4 -Baseline creatinine around 1.5-2 -Creatinine stable at baseline, IV fluids discontinued -Diuretics on hold  Chronic systolic and diastolic CHF -Last echo 2/83 with EF of 25 to 30% -Nonischemic cardiomyopathy per cardiology notes, with AICD -Continue carvedilol, holding torsemide  Recent COVID-19 infection -Tested positive for Covid on 1/13, continue isolation until 2/3  UTI versus asymptomatic bacteriuria -Urine culture with multiple flora, discontinue antibiotics today    Morbid obesity ([place]) -BMI 38  Type 2 diabetes mellitus -CBG stable continue Lantus and sliding scale, hemoglobin A1c is 9   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 -CT notes incidental right adrenal mass which is stable, suspected to represent adrenal adenoma, follow-up  with PCP for this   DVT prophylaxis: Heparin subcutaneous Code Status: Full code Family Communication: No family at bedside, patient updated Disposition Plan: Home pending resolution of SBO, likely tomorrow  Consultants:   General surgery   Procedures:   Antimicrobials:    Subjective: -Feels better today, tolerating liquids, had a bowel movement this morning  Objective: Vitals:   08/19/19 2135 08/20/19 0432 08/20/19 0434 08/20/19 1242  BP: 127/65  (!) 114/51 (!) 142/63  Pulse: 76  68 73  Resp: 16  14 15   Temp: 97.9 F (36.6 C)  98.7 F (37.1 C) 97.6 F (36.4 C)  TempSrc: Oral  Oral Oral  SpO2: 100%  97% 94%  Weight:  104.2 kg    Height:        Intake/Output Summary (Last 24 hours) at 08/20/2019 1244 Last data filed at 08/19/2019 1300 Gross per 24 hour  Intake 300 ml  Output --  Net 300 ml   Filed Weights   08/17/19 1255 08/20/19 0432  Weight: 105.2 kg 104.2 kg    Examination:  Gen: Obese pleasant female sitting up in bed, AAOx3, no distress HEENT:  no JVD Lungs: Diminished breath sounds the bases, otherwise clear CVS: S1-S2, regular rate rhythm Abd: soft, mildly distended, obese, nontender, BS present Extremities: No edema Skin: no new rashes Psychiatry: Judgement and insight appear normal. Mood & affect appropriate.     Data Reviewed:   CBC: Recent Labs  Lab 08/17/19 1232 08/18/19 0245 08/19/19 0418 08/20/19 0500  WBC 11.0* 11.7* 9.5 7.8  HGB 11.7* 11.3* 11.3* 10.5*  HCT 37.1 35.4* 35.8* 34.2*  MCV 82.8 81.4 83.6 83.4  PLT 344 317 303 662   Basic Metabolic Panel: Recent Labs  Lab 08/17/19 2001 08/17/19 2354 08/18/19 0245 08/19/19 0418 08/20/19 0500  NA 128* 130* 131* 139 137  K 5.8* 5.1 5.2* 4.2 4.4  CL 94* 95* 95* 103 102  CO2 25 25 24 23 25   GLUCOSE 279* 248* 251* 120* 131*  BUN 49* 48* 52* 49* 45*  CREATININE 1.97* 2.07* 2.19* 2.15* 2.12*  CALCIUM 9.5 9.6 9.9 9.2 9.0  MG  --   --  2.6*  --   --   PHOS  --   --  3.8  --    --    GFR: Estimated Creatinine Clearance: 30 mL/min (A) (by C-G formula based on SCr of 2.12 mg/dL (H)). Liver Function Tests: Recent Labs  Lab 08/17/19 1232 08/18/19 0245 08/19/19 0418  AST 34 25 25  ALT 36 30 27  ALKPHOS 128* 115 96  BILITOT 1.4* 1.4* 0.5  PROT 7.8 7.7 6.8  ALBUMIN 3.4* 3.3* 3.0*   Recent Labs  Lab 08/17/19 1232  LIPASE 31   No results for input(s): AMMONIA in the last 168 hours. Coagulation Profile: No results for input(s): INR, PROTIME in the last 168 hours. Cardiac Enzymes: Recent Labs  Lab 08/17/19 2053  CKTOTAL 45   BNP (last 3 results) No results for input(s): PROBNP in the last 8760 hours. HbA1C: No results for input(s): HGBA1C in the last 72 hours. CBG: Recent Labs  Lab 08/19/19 2136 08/20/19 0005 08/20/19 0436 08/20/19 0755 08/20/19 1148  GLUCAP 217* 156* 118* 133* 194*   Lipid Profile: No results for input(s): CHOL, HDL, LDLCALC, TRIG, CHOLHDL, LDLDIRECT in the last 72 hours. Thyroid Function Tests: Recent Labs    08/18/19 0245  TSH 2.438   Anemia Panel: No results for input(s): VITAMINB12, FOLATE, FERRITIN, TIBC, IRON, RETICCTPCT in the last 72 hours. Urine analysis:    Component Value Date/Time   COLORURINE YELLOW 08/17/2019 1349   APPEARANCEUR CLOUDY (A) 08/17/2019 1349   LABSPEC 1.014 08/17/2019 1349   PHURINE 5.0 08/17/2019 1349   GLUCOSEU NEGATIVE 08/17/2019 1349   HGBUR NEGATIVE 08/17/2019 1349   [place] 08/17/2019 1349   KETONESUR NEGATIVE 08/17/2019 1349   PROTEINUR 30 (A) 08/17/2019 1349   NITRITE NEGATIVE 08/17/2019 1349   LEUKOCYTESUR LARGE (A) 08/17/2019 1349   Sepsis Labs: @LABRCNTIP (procalcitonin:4,lacticidven:4)  ) Recent Results (from the past 240 hour(s))  Urine Culture     Status: Abnormal   Collection Time: 08/17/19  4:14 PM   Specimen: Urine, Random  Result Value Ref Range Status   Specimen Description URINE, RANDOM  Final   Special Requests   Final    NONE Performed at  [facility], [place] [address] [place], [place] [address]    Culture MULTIPLE SPECIES PRESENT, SUGGEST RECOLLECTION (A)  Final   Report Status 08/18/2019 FINAL  Final  Respiratory Panel by RT PCR (Flu A&B, Covid) - Nasopharyngeal Swab     Status: None   Collection Time: 08/17/19  6:38 PM   Specimen: Nasopharyngeal Swab  Result Value Ref Range Status   SARS Coronavirus 2 by RT PCR NEGATIVE NEGATIVE Final    Comment: (NOTE) SARS-CoV-2 target nucleic acids are NOT DETECTED. The SARS-CoV-2 RNA is generally detectable in upper respiratoy specimens during the acute phase of infection. The lowest concentration of SARS-CoV-2 viral copies this assay can detect is 131 copies/mL. A negative result does not preclude SARS-Cov-2 infection and should not be used as the sole basis for treatment or other patient management decisions. A negative result may occur with  improper specimen collection/handling, submission of specimen other than nasopharyngeal swab, presence of  viral mutation(s) within the areas targeted by this assay, and inadequate number of viral copies (<131 copies/mL). A negative result must be combined with clinical observations, patient history, and epidemiological information. The expected result is Negative. Fact Sheet for Patients:  PinkCheek.be Fact Sheet for Healthcare Providers:  GravelBags.it This test is not yet ap proved or cleared by the [place] FDA and  has been authorized for detection and/or diagnosis of SARS-CoV-2 by FDA under an Emergency Use Authorization (EUA). This EUA will remain  in effect (meaning this test can be used) for the duration of the COVID-19 declaration under Section 564(b)(1) of the Act, 21 U.S.C. section 360bbb-3(b)(1), unless the authorization is terminated or revoked sooner.    Influenza A by PCR NEGATIVE NEGATIVE Final   Influenza B by PCR NEGATIVE NEGATIVE Final    Comment:  (NOTE) The Xpert Xpress SARS-CoV-2/FLU/RSV assay is intended as an aid in  the diagnosis of influenza from Nasopharyngeal swab specimens and  should not be used as a sole basis for treatment. Nasal washings and  aspirates are unacceptable for Xpert Xpress SARS-CoV-2/FLU/RSV  testing. Fact Sheet for Patients: PinkCheek.be Fact Sheet for Healthcare Providers: GravelBags.it This test is not yet approved or cleared by the [place] FDA and  has been authorized for detection and/or diagnosis of SARS-CoV-2 by  FDA under an Emergency Use Authorization (EUA). This EUA will remain  in effect (meaning this test can be used) for the duration of the  Covid-19 declaration under Section 564(b)(1) of the Act, 21  U.S.C. section 360bbb-3(b)(1), unless the authorization is  terminated or revoked. Performed at [facility], [place] [address], [place], [place] [address]          Radiology Studies: No results found.      Scheduled Meds: . allopurinol  100 mg Oral Daily  . carvedilol  25 mg Oral BID WC  . DULoxetine  30 mg Oral Daily  . gabapentin  300 mg Oral QHS  . heparin injection (subcutaneous)  5,000 Units Subcutaneous Q8H  . insulin aspart  0-9 Units Subcutaneous Q4H  . insulin glargine  27 Units Subcutaneous BID  . ipratropium  2 puff Inhalation Q6H  . levothyroxine  88 mcg Oral Q0600  . pantoprazole  40 mg Oral Q1200  . rosuvastatin  10 mg Oral q1800  . sodium chloride flush  3 mL Intravenous Once   Continuous Infusions:    LOS: 3 days    Time spent: 67min  Domenic Polite, MD [facility]  08/20/2019, 12:44 PM

## 2019-08-21 DIAGNOSIS — R1084 Generalized abdominal pain: Secondary | ICD-10-CM

## 2019-08-21 DIAGNOSIS — N184 Chronic kidney disease, stage 4 (severe): Secondary | ICD-10-CM

## 2019-08-21 LAB — GLUCOSE, CAPILLARY
Glucose-Capillary: 154 mg/dL — ABNORMAL HIGH (ref 70–99)
Glucose-Capillary: 196 mg/dL — ABNORMAL HIGH (ref 70–99)
Glucose-Capillary: 276 mg/dL — ABNORMAL HIGH (ref 70–99)
Glucose-Capillary: 319 mg/dL — ABNORMAL HIGH (ref 70–99)

## 2019-08-21 NOTE — Progress Notes (Signed)
Crystal [name] to be D/C'd Home per MD order.  Discussed with the patient and all questions fully answered.  VSS, Skin clean, dry and intact without evidence of skin break down, no evidence of skin tears noted. IV catheter discontinued intact. Site without signs and symptoms of complications. Dressing and pressure applied.  An After Visit Summary was printed and given to the patient.   D/c education completed with patient/family including follow up instructions, medication list, d/c activities limitations if indicated, with other d/c instructions as indicated by MD - patient able to verbalize understanding, all questions fully answered.   Patient instructed to return to ED, call 911, or call MD for any changes in condition.   Patient escorted via [place], and D/C home via private auto.  Crystal [name] 08/21/2019 1:55 PM

## 2019-08-21 NOTE — Progress Notes (Signed)
Patient seen and examined.  Patient tolerating a soft diet without any nausea or vomiting.  She reports she is passing flatus   No bloating or abdominal pain. On exam, abdomen is soft and nontender.  Ventral hernia is soft, NT and reducible. Patient is okay for discharge from surgery standpoint We will follow peripherally

## 2019-08-21 NOTE — Discharge Summary (Signed)
Physician Discharge Summary  Crystal [name]:[id number] DOB: Jan 14, 1951 DOA: 08/17/2019  PCP: Crist Infante, MD  Admit date: 08/17/2019 Discharge date: 08/21/2019  Time spent: 35 minutes  Recommendations for Outpatient Follow-up:  1. PCP in 1 week, please check bmet at follow-up, consider follow-up of adrenal adenoma incidentally seen on admission  2. Nephrology in 1 month   Discharge Diagnoses:  Recurrent small bowel obstruction Chronic kidney disease stage 4   Nonischemic cardiomyopathy (HCC)   Essential hypertension   Morbid obesity (HCC)   Automatic implantable cardioverter-defibrillator in situ   Diabetes mellitus, type 2 (HCC)   Chronic systolic heart failure (HCC)   History of prolonged Q-T interval on ECG   HLD (hyperlipidemia)   Hyperkalemia   Bowel obstruction (HCC)   Type 2 diabetes mellitus with stage 3 chronic kidney disease, with long-term current use of insulin (HCC)   SBO (small bowel obstruction) (HCC)   Hyponatremia   COVID-19 virus infection   Acute lower UTI   Prolonged QT interval   Generalized abdominal pain   CKD (chronic kidney disease) stage 4, GFR 15-29 ml/min ([place])   Discharge Condition: Improved  Diet recommendation: Soft, diabetic, low-salt diet  Filed Weights   08/17/19 1255 08/20/19 0432  Weight: 105.2 kg 104.2 kg    History of present illness:  Crystal [name] a 69 y.o.femalewith medical history significant of positive for COVID 8/56/3149, chronic systolic CHF, uncontrolled diabetes type 2, CKD stage IIIb, hypothyroidism, asthma/COPD chronic chronic pain,status post AICD Known hx of recurrent bowel obstruction needing operative repair sp colostomy and colostomy take down by Dr. Hulen Skains presented to the ED 1/23 with lower abdominal pain, nausea vomiting, imaging was concerning for distal small bowel obstruction secondary to ventral hernia -shewas recently admitted for Covid infection from 13th-16th of January 2021 she was treated with  Decadron and remdesivir    Hospital Course:   Recurrent small bowel obstruction -Likely secondary to additions and hernia -General surgery consulted -Improving with bowel rest, NG decompression, having bowel movements now  -Diet advanced and having bowel movements at discharge  CKD 4 -Baseline creatinine around 1.5-2 -Creatinine stable at baseline, IV fluids discontinued -Torsemide resumed at discharge  Chronic systolic and diastolic CHF -Last echo 7/02 with EF of 25 to 30% -Nonischemic cardiomyopathy per cardiology notes, with AICD -Continue carvedilol, resumed torsemide  Recent COVID-19 infection -Tested positive for Covid on 1/13, continue isolation until 2/3  UTI versus asymptomatic bacteriuria -Urine culture with multiple flora, discontinue antibiotics today    Morbid obesity ([place]) -BMI 38  Type 2 diabetes mellitus -CBG stable continue Lantus and sliding scale, hemoglobin A1c is 9   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 -CT notes incidental right adrenal mass which is stable, suspected to represent adrenal adenoma,  -follow-up with PCP for this   Discharge Exam: Vitals:   08/21/19 0418 08/21/19 0900  BP: 126/65 (!) 141/56  Pulse: 80   Resp: 16   Temp: 98.3 F (36.8 C)   SpO2: 98% 96%    General: Awake alert oriented x3 Cardiovascular: S1-S2,RRR Respiratory: Clear  Discharge Instructions   Discharge Instructions    Diet - low sodium heart healthy   Complete by: As directed    Diet Carb Modified   Complete by: As directed    Increase activity slowly   Complete by: As directed      Allergies as of 08/21/2019      Reactions   Ace Inhibitors Swelling, Other (See Comments)   Angioedema   Glucophage [metformin Hcl]  Other (See Comments)   Renal failure   Telmisartan Other (See Comments)   AVOID ARB/ ACEi in this patient due to recurrent AKI   Advicor [niacin-lovastatin Er] Other (See Comments)   Muscle aches   Bystolic [nebivolol Hcl] Swelling    Whole body swelled   Erythromycin Diarrhea, Nausea And Vomiting, Other (See Comments)   *DERIVATIVES*   Lipitor [atorvastatin] Other (See Comments)   Muscle aches   Lopid [gemfibrozil] Other (See Comments)   Muscle aches   Statins Other (See Comments)   Muscle aches tolerates crestor      Medication List    TAKE these medications   acetaminophen 500 MG tablet Commonly known as: TYLENOL Take 500 mg by mouth daily as needed for headache (pain).   albuterol 108 (90 Base) MCG/ACT inhaler Commonly known as: VENTOLIN HFA Inhale 2 puffs into the lungs every 6 (six) hours as needed for wheezing or shortness of breath.   allopurinol 100 MG tablet Commonly known as: ZYLOPRIM TAKE 1 TABLET(100 MG) BY MOUTH DAILY What changed: See the new instructions.   Caltrate 600+D 600-400 MG-UNIT tablet Generic drug: Calcium Carbonate-Vitamin D Take 1 tablet by mouth 2 (two) times daily.   carvedilol 25 MG tablet Commonly known as: COREG Take 25 mg by mouth 2 (two) times daily.   Crestor 10 MG tablet Generic drug: rosuvastatin Take 1 tablet (10 mg total) by mouth at bedtime.   DULoxetine 30 MG capsule Commonly known as: CYMBALTA Take 1 capsule (30 mg total) by mouth daily. What changed: when to take this   gabapentin 300 MG capsule Commonly known as: NEURONTIN Take 300 mg by mouth at bedtime.   HumuLIN R U-500 KwikPen 500 UNIT/ML kwikpen Generic drug: insulin regular human CONCENTRATED Inject 115-120 Units into the skin See admin instructions. Inject 120 units subcutaneously before breakfast and lunch, inject 115 units at bedtime   levothyroxine 88 MCG tablet Commonly known as: SYNTHROID Take 88 mcg by mouth daily before breakfast.   loratadine 10 MG tablet Commonly known as: CLARITIN Take 10 mg by mouth daily.   montelukast 10 MG tablet Commonly known as: SINGULAIR Take 10 mg by mouth at bedtime.   nystatin ointment Commonly known as: MYCOSTATIN Apply 1 application  topically 2 (two) times daily. For yeast infection   omega-3 acid ethyl esters 1 g capsule Commonly known as: LOVAZA Take 4 g by mouth at bedtime.   pantoprazole 40 MG tablet Commonly known as: PROTONIX Take 40 mg by mouth at bedtime.   PRESCRIPTION MEDICATION Inhale into the lungs at bedtime. CPAP with oxygen   temazepam 15 MG capsule Commonly known as: RESTORIL Take 1 capsule (15 mg total) by mouth at bedtime as needed for sleep.   torsemide 20 MG tablet Commonly known as: DEMADEX Take 30 mg by mouth daily.   traMADol 50 MG tablet Commonly known as: ULTRAM Take 1 tablet (50 mg total) by mouth every 6 (six) hours as needed for moderate pain or severe pain.      Allergies  Allergen Reactions  . Ace Inhibitors Swelling and Other (See Comments)    Angioedema  . Glucophage [Metformin Hcl] Other (See Comments)    Renal failure  . Telmisartan Other (See Comments)    AVOID ARB/ ACEi in this patient due to recurrent AKI  . Advicor [Niacin-Lovastatin Er] Other (See Comments)    Muscle aches  . Bystolic [Nebivolol Hcl] Swelling    Whole body swelled  . Erythromycin Diarrhea, Nausea And Vomiting and  Other (See Comments)    *DERIVATIVES*  . Lipitor [Atorvastatin] Other (See Comments)    Muscle aches  . Lopid [Gemfibrozil] Other (See Comments)    Muscle aches  . Statins Other (See Comments)    Muscle aches tolerates crestor   Follow-up [facility], [place]. Schedule an appointment as soon as possible for a visit.   Specialty: General Surgery Why: If you would like to discuss with a surgeon repair of your hernia on an elective basis Contact information: [address] [place] [phone]       Crist Infante, MD. Schedule an appointment as soon as possible for a visit in 1 week(s).   Specialty: Internal Medicine Contact information: [place] [address] [phone]         Jettie Booze, MD .   Specialties: Cardiology, Radiology, Interventional Cardiology Contact information: [address] [place] [address] [phone]            The results of significant diagnostics from this hospitalization (including imaging, microbiology, ancillary and laboratory) are listed below for reference.    Significant Diagnostic Studies: CT ABDOMEN PELVIS WO CONTRAST  Result Date: 08/17/2019 CLINICAL DATA:  Lower abdominal pain with nausea. EXAM: CT ABDOMEN AND PELVIS WITHOUT CONTRAST TECHNIQUE: Multidetector CT imaging of the abdomen and pelvis was performed following the standard protocol without IV contrast. COMPARISON:  April 17, 2018 FINDINGS: Lower chest: Small patchy areas of atelectasis and/or infiltrate are seen within the bilateral lung bases. Hepatobiliary: No focal liver abnormality is seen. A subcentimeter gallstone is seen within the lumen of an otherwise normal-appearing gallbladder. Pancreas: Unremarkable. No pancreatic ductal dilatation or surrounding inflammatory changes. Spleen: Normal in size without focal abnormality. Adrenals/Urinary Tract: There is a stable 1.9 cm x 1.5 cm low-attenuation right adrenal mass. The left adrenal gland is normal in appearance. Kidneys are normal, without renal calculi, focal lesion, or hydronephrosis. Bladder is unremarkable. Stomach/Bowel: Stomach is within normal limits. Surgically anastomosed bowel is seen within the anterior aspect of the lower abdomen, along the midline. Multiple dilated small bowel loops are seen (maximum small bowel diameter of approximately 3.8 cm). These dilated loops extend into a 3.8 cm x 2.0 cm ventral hernia seen along the lower pelvic wall, to the left of midline (axial CT image 77, CT series number 3). An additional 5.9 cm x 4.5 cm ventral hernia is seen along the lower pelvic wall on the right. This contains a dilated loop of small bowel. The loop of small bowel that  exits this hernia is normal in caliber (axial CT images 68 through 80, CT series number 3). Vascular/Lymphatic: Moderate severity aortic atherosclerosis. No enlarged abdominal or pelvic lymph nodes. Reproductive: Status post hysterectomy. No adnexal masses. Other: No abdominopelvic ascites. Musculoskeletal: Multilevel degenerative changes seen throughout the lumbar spine. IMPRESSION: 1. Distal small bowel obstruction secondary to the presence of ventral hernia located within the anterior aspect of the lower pelvic wall on the right. 2. Cholelithiasis. 3. Stable low-attenuation right adrenal mass which may represent an adrenal adenoma. Electronically Signed   By: Virgina Norfolk M.D.   On: 08/17/2019 17:05   DG CHEST PORT 1 VIEW  Result Date: 08/17/2019 CLINICAL DATA:  Cough, abdominal pain, COVID-19 positive EXAM: PORTABLE CHEST 1 VIEW COMPARISON:  Portable exam 2051 hours compared to 08/07/2019 FINDINGS: Nasogastric tube extends into abdomen. LEFT subclavian pacemaker/ICD leads unchanged. Enlargement of cardiac silhouette. Mediastinal contours and pulmonary  vascularity normal. Atherosclerotic calcification aorta. Patchy RIGHT basilar infiltrate question atelectasis versus pneumonia. Remaining lungs clear. No pleural effusion or pneumothorax. IMPRESSION: Atelectasis versus infiltrate at RIGHT base. Aortic Atherosclerosis (ICD10-I70.0). Electronically Signed   By: Lavonia Dana M.D.   On: 08/17/2019 21:00   DG Chest Port 1 View  Result Date: 08/07/2019 CLINICAL DATA:  Short of breath EXAM: PORTABLE CHEST 1 VIEW COMPARISON:  07/12/2018 FINDINGS: Cardiac enlargement with mild vascular congestion. Negative for edema or effusion. Pacemaker leads unchanged with internal defibrillator. IMPRESSION: Mild vascular congestion without edema. Electronically Signed   By: Franchot Gallo M.D.   On: 08/07/2019 17:27   DG Abd Portable 1V-Small Bowel Obstruction Protocol-initial, 8 hr delay  Result Date:  08/18/2019 CLINICAL DATA:  Small bowel obstruction. Assess migration of contrast material. Status post subtotal colectomy and ileal rectal anastomosis. EXAM: PORTABLE ABDOMEN - 1 VIEW COMPARISON:  CT scan 08/17/2019 FINDINGS: Contrast material is contained within small bowel loops of the right and central pelvis. This is probably still in the distal small bowel and although projecting low in the anatomic pelvis, review of the CT scan from yesterday shows marked laxity of the anterior abdominal wall fascia with small bowel loops projecting down in front of the urinary bladder. IMPRESSION: Contrast has migrated into small bowel loops of the pelvis although given the laxity of the anterior abdominal wall and position of small-bowel as noted on yesterday's CT scan, definite localization of the contrast is not possible. Lateral projection may prove helpful to further evaluate. NG tube tip is in the distal stomach. Electronically Signed   By: Misty Stanley M.D.   On: 08/18/2019 08:34   DG Abd Portable 1V-Small Bowel Protocol-Position Verification  Result Date: 08/17/2019 CLINICAL DATA:  NG tube placement EXAM: PORTABLE ABDOMEN - 1 VIEW COMPARISON:  None. FINDINGS: Evaluation is limited by patient body habitus. The enteric tube appears to course into the gastric body and is kinked with the tip terminating near the GE junction. IMPRESSION: Enteric tube as above. The tube is likely kinked in the gastric body with the tip terminating near the GE junction. Repositioning is recommended. Electronically Signed   By: Constance Holster M.D.   On: 08/17/2019 18:36    Microbiology: Recent Results (from the past 240 hour(s))  Urine Culture     Status: Abnormal   Collection Time: 08/17/19  4:14 PM   Specimen: Urine, Random  Result Value Ref Range Status   Specimen Description URINE, RANDOM  Final   Special Requests   Final    NONE Performed at [facility], [address] [place], [place] [address]    Culture  MULTIPLE SPECIES PRESENT, SUGGEST RECOLLECTION (A)  Final   Report Status 08/18/2019 FINAL  Final  Respiratory Panel by RT PCR (Flu A&B, Covid) - Nasopharyngeal Swab     Status: None   Collection Time: 08/17/19  6:38 PM   Specimen: Nasopharyngeal Swab  Result Value Ref Range Status   SARS Coronavirus 2 by RT PCR NEGATIVE NEGATIVE Final    Comment: (NOTE) SARS-CoV-2 target nucleic acids are NOT DETECTED. The SARS-CoV-2 RNA is generally detectable in upper respiratoy specimens during the acute phase of infection. The lowest concentration of SARS-CoV-2 viral copies this assay can detect is 131 copies/mL. A negative result does not preclude SARS-Cov-2 infection and should not be used as the sole basis for treatment or other patient management decisions. A negative result may occur with  improper specimen collection/handling, submission of specimen other than  nasopharyngeal swab, presence of viral mutation(s) within the areas targeted by this assay, and inadequate number of viral copies (<131 copies/mL). A negative result must be combined with clinical observations, patient history, and epidemiological information. The expected result is Negative. Fact Sheet for Patients:  PinkCheek.be Fact Sheet for Healthcare Providers:  GravelBags.it This test is not yet ap proved or cleared by the [place] FDA and  has been authorized for detection and/or diagnosis of SARS-CoV-2 by FDA under an Emergency Use Authorization (EUA). This EUA will remain  in effect (meaning this test can be used) for the duration of the COVID-19 declaration under Section 564(b)(1) of the Act, 21 U.S.C. section 360bbb-3(b)(1), unless the authorization is terminated or revoked sooner.    Influenza A by PCR NEGATIVE NEGATIVE Final   Influenza B by PCR NEGATIVE NEGATIVE Final    Comment: (NOTE) The Xpert Xpress SARS-CoV-2/FLU/RSV assay is intended as an aid in   the diagnosis of influenza from Nasopharyngeal swab specimens and  should not be used as a sole basis for treatment. Nasal washings and  aspirates are unacceptable for Xpert Xpress SARS-CoV-2/FLU/RSV  testing. Fact Sheet for Patients: PinkCheek.be Fact Sheet for Healthcare Providers: GravelBags.it This test is not yet approved or cleared by the [place] FDA and  has been authorized for detection and/or diagnosis of SARS-CoV-2 by  FDA under an Emergency Use Authorization (EUA). This EUA will remain  in effect (meaning this test can be used) for the duration of the  Covid-19 declaration under Section 564(b)(1) of the Act, 21  U.S.C. section 360bbb-3(b)(1), unless the authorization is  terminated or revoked. Performed at [facility], [place] [address], [place], [place] [address]      Labs: Basic Metabolic Panel: Recent Labs  Lab 08/17/19 2001 08/17/19 2354 08/18/19 0245 08/19/19 0418 08/20/19 0500  NA 128* 130* 131* 139 137  K 5.8* 5.1 5.2* 4.2 4.4  CL 94* 95* 95* 103 102  CO2 25 25 24 23 25   GLUCOSE 279* 248* 251* 120* 131*  BUN 49* 48* 52* 49* 45*  CREATININE 1.97* 2.07* 2.19* 2.15* 2.12*  CALCIUM 9.5 9.6 9.9 9.2 9.0  MG  --   --  2.6*  --   --   PHOS  --   --  3.8  --   --    Liver Function Tests: Recent Labs  Lab 08/17/19 1232 08/18/19 0245 08/19/19 0418  AST 34 25 25  ALT 36 30 27  ALKPHOS 128* 115 96  BILITOT 1.4* 1.4* 0.5  PROT 7.8 7.7 6.8  ALBUMIN 3.4* 3.3* 3.0*   Recent Labs  Lab 08/17/19 1232  LIPASE 31   No results for input(s): AMMONIA in the last 168 hours. CBC: Recent Labs  Lab 08/17/19 1232 08/18/19 0245 08/19/19 0418 08/20/19 0500  WBC 11.0* 11.7* 9.5 7.8  HGB 11.7* 11.3* 11.3* 10.5*  HCT 37.1 35.4* 35.8* 34.2*  MCV 82.8 81.4 83.6 83.4  PLT 344 317 303 276   Cardiac Enzymes: Recent Labs  Lab 08/17/19 2053  CKTOTAL 45   BNP: BNP (last 3 results) Recent Labs     08/07/19 1732  BNP 50.2    ProBNP (last 3 results) No results for input(s): PROBNP in the last 8760 hours.  CBG: Recent Labs  Lab 08/20/19 2021 08/21/19 0007 08/21/19 0416 08/21/19 0853 08/21/19 1143  GLUCAP 168* 196* 154* 319* 276*       Signed:  Domenic Polite MD.  [facility] 08/21/2019, 1:08 PM

## 2019-08-21 NOTE — Care Management Important Message (Signed)
Important Message  Patient Details  Name: Crystal [name] MRN: [id number] Date of Birth: [birth year]   Medicare Important Message Given:  Yes - Important Message mailed due to current National Emergency  Verbal consent obtained due to current National Emergency  Relationship to patient: Self Contact Name: Markiyah Gahm Call Date: 08/21/19  Time: 1201 Phone: [phone] Outcome: Spoke with contact Important Message mailed to: Patient address on file    Delorse Lek 08/21/2019, 12:02 PM

## 2019-08-21 NOTE — Care Management (Signed)
Pt deemed stable for discharge today.  CM reviewed chart - no TOC needs determined.  Discharge orders signed - no outstanding TOC consults - CM signing off

## 2019-09-10 DIAGNOSIS — R509 Fever, unspecified: Secondary | ICD-10-CM | POA: Diagnosis not present

## 2019-09-10 DIAGNOSIS — I130 Hypertensive heart and chronic kidney disease with heart failure and stage 1 through stage 4 chronic kidney disease, or unspecified chronic kidney disease: Secondary | ICD-10-CM | POA: Diagnosis not present

## 2019-09-10 DIAGNOSIS — D6489 Other specified anemias: Secondary | ICD-10-CM | POA: Diagnosis not present

## 2019-09-10 DIAGNOSIS — R7989 Other specified abnormal findings of blood chemistry: Secondary | ICD-10-CM | POA: Diagnosis not present

## 2019-09-17 DIAGNOSIS — H26493 Other secondary cataract, bilateral: Secondary | ICD-10-CM | POA: Diagnosis not present

## 2019-09-17 DIAGNOSIS — H538 Other visual disturbances: Secondary | ICD-10-CM | POA: Diagnosis not present

## 2019-09-17 DIAGNOSIS — H40019 Open angle with borderline findings, low risk, unspecified eye: Secondary | ICD-10-CM | POA: Diagnosis not present

## 2019-09-17 DIAGNOSIS — H52203 Unspecified astigmatism, bilateral: Secondary | ICD-10-CM | POA: Diagnosis not present

## 2019-09-17 DIAGNOSIS — H524 Presbyopia: Secondary | ICD-10-CM | POA: Diagnosis not present

## 2019-09-17 DIAGNOSIS — E119 Type 2 diabetes mellitus without complications: Secondary | ICD-10-CM | POA: Diagnosis not present

## 2019-09-18 DIAGNOSIS — D631 Anemia in chronic kidney disease: Secondary | ICD-10-CM | POA: Diagnosis not present

## 2019-09-18 DIAGNOSIS — N1832 Chronic kidney disease, stage 3b: Secondary | ICD-10-CM | POA: Diagnosis not present

## 2019-09-20 DIAGNOSIS — D649 Anemia, unspecified: Secondary | ICD-10-CM | POA: Diagnosis not present

## 2019-09-20 DIAGNOSIS — E7849 Other hyperlipidemia: Secondary | ICD-10-CM | POA: Diagnosis not present

## 2019-09-26 DIAGNOSIS — D509 Iron deficiency anemia, unspecified: Secondary | ICD-10-CM | POA: Diagnosis not present

## 2019-09-26 DIAGNOSIS — I129 Hypertensive chronic kidney disease with stage 1 through stage 4 chronic kidney disease, or unspecified chronic kidney disease: Secondary | ICD-10-CM | POA: Diagnosis not present

## 2019-09-26 DIAGNOSIS — E1122 Type 2 diabetes mellitus with diabetic chronic kidney disease: Secondary | ICD-10-CM | POA: Diagnosis not present

## 2019-09-26 DIAGNOSIS — N1832 Chronic kidney disease, stage 3b: Secondary | ICD-10-CM | POA: Diagnosis not present

## 2019-09-26 DIAGNOSIS — E559 Vitamin D deficiency, unspecified: Secondary | ICD-10-CM | POA: Diagnosis not present

## 2019-09-26 DIAGNOSIS — I5042 Chronic combined systolic (congestive) and diastolic (congestive) heart failure: Secondary | ICD-10-CM | POA: Diagnosis not present

## 2019-09-27 ENCOUNTER — Ambulatory Visit (INDEPENDENT_AMBULATORY_CARE_PROVIDER_SITE_OTHER): Payer: Medicare Other | Admitting: *Deleted

## 2019-09-27 DIAGNOSIS — Z95810 Presence of automatic (implantable) cardiac defibrillator: Secondary | ICD-10-CM | POA: Diagnosis not present

## 2019-09-28 LAB — CUP PACEART REMOTE DEVICE CHECK
Battery Remaining Longevity: 90 mo
Battery Remaining Percentage: 100 %
Brady Statistic RA Percent Paced: 0 %
Brady Statistic RV Percent Paced: 99 %
Date Time Interrogation Session: 20210305020100
HighPow Impedance: 68 Ohm
Implantable Lead Implant Date: 20191129
Implantable Lead Implant Date: 20191129
Implantable Lead Implant Date: 20191129
Implantable Lead Location: 753858
Implantable Lead Location: 753859
Implantable Lead Location: 753860
Implantable Lead Model: 293
Implantable Lead Model: 4086
Implantable Lead Serial Number: 226691
Implantable Lead Serial Number: 440868
Implantable Pulse Generator Implant Date: 20191129
Lead Channel Impedance Value: 473 Ohm
Lead Channel Impedance Value: 615 Ohm
Lead Channel Impedance Value: 817 Ohm
Lead Channel Setting Pacing Amplitude: 2 V
Lead Channel Setting Pacing Amplitude: 2.5 V
Lead Channel Setting Pacing Amplitude: 3 V
Lead Channel Setting Pacing Pulse Width: 0.4 ms
Lead Channel Setting Pacing Pulse Width: 1 ms
Lead Channel Setting Sensing Sensitivity: 0.5 mV
Lead Channel Setting Sensing Sensitivity: 1 mV
Pulse Gen Serial Number: 227627

## 2019-09-28 NOTE — Progress Notes (Signed)
ICD remote 

## 2019-10-01 DIAGNOSIS — M65351 Trigger finger, right little finger: Secondary | ICD-10-CM | POA: Diagnosis not present

## 2019-10-01 DIAGNOSIS — M65352 Trigger finger, left little finger: Secondary | ICD-10-CM | POA: Diagnosis not present

## 2019-10-01 DIAGNOSIS — M65322 Trigger finger, left index finger: Secondary | ICD-10-CM | POA: Diagnosis not present

## 2019-10-09 DIAGNOSIS — Z794 Long term (current) use of insulin: Secondary | ICD-10-CM | POA: Diagnosis not present

## 2019-10-09 DIAGNOSIS — N184 Chronic kidney disease, stage 4 (severe): Secondary | ICD-10-CM | POA: Diagnosis not present

## 2019-10-09 DIAGNOSIS — E1159 Type 2 diabetes mellitus with other circulatory complications: Secondary | ICD-10-CM | POA: Diagnosis not present

## 2019-10-09 DIAGNOSIS — I130 Hypertensive heart and chronic kidney disease with heart failure and stage 1 through stage 4 chronic kidney disease, or unspecified chronic kidney disease: Secondary | ICD-10-CM | POA: Diagnosis not present

## 2019-10-22 DIAGNOSIS — I10 Essential (primary) hypertension: Secondary | ICD-10-CM | POA: Diagnosis not present

## 2019-10-22 DIAGNOSIS — I509 Heart failure, unspecified: Secondary | ICD-10-CM | POA: Diagnosis not present

## 2019-10-22 DIAGNOSIS — E1165 Type 2 diabetes mellitus with hyperglycemia: Secondary | ICD-10-CM | POA: Diagnosis not present

## 2019-10-22 DIAGNOSIS — M545 Low back pain: Secondary | ICD-10-CM | POA: Diagnosis not present

## 2019-10-31 DIAGNOSIS — E1159 Type 2 diabetes mellitus with other circulatory complications: Secondary | ICD-10-CM | POA: Diagnosis not present

## 2019-10-31 DIAGNOSIS — M109 Gout, unspecified: Secondary | ICD-10-CM | POA: Diagnosis not present

## 2019-10-31 DIAGNOSIS — E7849 Other hyperlipidemia: Secondary | ICD-10-CM | POA: Diagnosis not present

## 2019-11-05 DIAGNOSIS — M65322 Trigger finger, left index finger: Secondary | ICD-10-CM | POA: Diagnosis not present

## 2019-11-05 DIAGNOSIS — M65352 Trigger finger, left little finger: Secondary | ICD-10-CM | POA: Diagnosis not present

## 2019-11-05 DIAGNOSIS — M65351 Trigger finger, right little finger: Secondary | ICD-10-CM | POA: Diagnosis not present

## 2019-11-07 DIAGNOSIS — E1159 Type 2 diabetes mellitus with other circulatory complications: Secondary | ICD-10-CM | POA: Diagnosis not present

## 2019-11-07 DIAGNOSIS — R82998 Other abnormal findings in urine: Secondary | ICD-10-CM | POA: Diagnosis not present

## 2019-11-07 DIAGNOSIS — Z0000 Encounter for general adult medical examination without abnormal findings: Secondary | ICD-10-CM | POA: Diagnosis not present

## 2019-11-07 DIAGNOSIS — Z794 Long term (current) use of insulin: Secondary | ICD-10-CM | POA: Diagnosis not present

## 2019-11-07 DIAGNOSIS — I130 Hypertensive heart and chronic kidney disease with heart failure and stage 1 through stage 4 chronic kidney disease, or unspecified chronic kidney disease: Secondary | ICD-10-CM | POA: Diagnosis not present

## 2019-11-07 DIAGNOSIS — Z1331 Encounter for screening for depression: Secondary | ICD-10-CM | POA: Diagnosis not present

## 2019-11-07 DIAGNOSIS — N184 Chronic kidney disease, stage 4 (severe): Secondary | ICD-10-CM | POA: Diagnosis not present

## 2019-11-07 DIAGNOSIS — F329 Major depressive disorder, single episode, unspecified: Secondary | ICD-10-CM | POA: Diagnosis not present

## 2019-11-07 DIAGNOSIS — E785 Hyperlipidemia, unspecified: Secondary | ICD-10-CM | POA: Diagnosis not present

## 2019-11-07 DIAGNOSIS — M5489 Other dorsalgia: Secondary | ICD-10-CM | POA: Diagnosis not present

## 2019-11-07 DIAGNOSIS — I509 Heart failure, unspecified: Secondary | ICD-10-CM | POA: Diagnosis not present

## 2019-11-07 DIAGNOSIS — E1129 Type 2 diabetes mellitus with other diabetic kidney complication: Secondary | ICD-10-CM | POA: Diagnosis not present

## 2019-11-07 DIAGNOSIS — R0901 Asphyxia: Secondary | ICD-10-CM | POA: Diagnosis not present

## 2019-11-07 DIAGNOSIS — M109 Gout, unspecified: Secondary | ICD-10-CM | POA: Diagnosis not present

## 2019-11-10 NOTE — Progress Notes (Signed)
Cardiology Office Note   Date:  11/13/2019   ID:  Crystal [name], DOB 20-Feb-1951, MRN [id number]  PCP:  Crist Infante, MD    No chief complaint on file.  NICM  Wt Readings from Last 3 Encounters:  11/13/19 237 lb 12.8 oz (107.9 kg)  08/20/19 229 lb 11.5 oz (104.2 kg)  08/07/19 232 lb (105.2 kg)       History of Present Illness: Crystal [name] is a 69 y.o. female  With a h/o, per the old chart,: "Chronic systolic heart failure  - long hx of NICM dating back to 2005, with reported LVEF at that time 24% - cath at that time showed no significant CAD.  - She has a chronic LBBB. Had a BiV AICD placed previously with epicardial LV lead with subsequent normalization of LVEF - LVEF improved with BiV pacing to >55%.  - from notes device reached ERI, her RV lead has increasing thresholds around that time. It was decided not to replace the device since normal LVEF and follow her clinically.   - repeat echo 04/2014 LVEF 50-55% - questionable hx of angioedema on ACE-Is, she states has been on ARBs and has tolerated."  She has CKD as well.  She had echo, cath in 2018 in [place], [place]. No CAD and EF 50% per her report.    SHe has had other medical problems. She had a colostomy for colitis. It has been reversed.   She has been gaining weight since that time. She has gained 30 lbs.  SHe had carpal tunnel surgery.  SHe has been followed by Dr. Caryl Comes as well. 11/19 cath showed:  Prox LAD lesion is 25% stenosed.  Ost 1st Diag lesion is 25% stenosed.  Mid LM lesion is 10% stenosed.  LV end diastolic pressure is mildly elevated. LVEDP 17 mm Hg.  There is no aortic valve stenosis.  Hemodynamic findings consistent with mild pulmonary hypertension.  Ao sat 98%, PA sat 66%, CO 4.79 L/min; CI 2.2, mean PA 30 mm Hg; mean PCWP 22 mm Hg  No significant CAD.   Medical therapy for LV dysfunction.   Echo in 09/2018 showed: The left ventricle has severely reduced  systolic function, with an ejection fraction of 25-30%. The cavity size was moderately dilated. There is mildly increased left ventricular wall thickness. Left ventricular diastolic Doppler parameters are  consistent with impaired relaxation. Left ventricular diffuse hypokinesis. 2. The right ventricle has normal systolic function. The cavity was normal. There is no increase in right ventricular wall thickness. 3. Mild thickening of the mitral valve leaflet. There is mild mitral annular calcification present. 4. The tricuspid valve is grossly normal. 5. The aortic valve is tricuspid Mild thickening of the aortic valve. 6. There is mild dilatation of the aortic root and of the ascending aorta measuring 39 mm. 7. Severe global reduction in LV systolic function; mild diastolic dysfunction; moderate LVE; mild LVH; mild MR.  Had AKI with increased diuretic.   Since the last visit, she has gained weight.  She is having back problems that limit her exertion.    She was hospitalized with COVID inJan 2021. Treated with Decadron and remdesivir.         Past Medical History:  Diagnosis Date  . AICD (automatic cardioverter/defibrillator) present    high RV threshold chronically, device was turned off in 2014; Device battery has been dead x 7 years; "turned it back on 06/22/2018"  . Anemia, iron deficiency   .  Angioedema    felt to likely be due to ace inhibitors but says she has had this even off of medicines, appears to be tolerating ARBs chronically  . Anxiety   . Arthritis    "hands, legs, arms; bad in my back" (06/22/2018)  . Asthmatic bronchitis with status asthmaticus   . Bradycardia   . CHF (congestive heart failure) ([place])   . Chronic lower back pain   . Chronic pain   . Chronic renal insufficiency   . CKD (chronic kidney disease), stage III   . Coronary artery disease    Mild nonobstructive (30% LAD, 30% RCA) by 09/01/16 cath at [facility]  . Degenerative joint disease    . Depression   . Diabetes mellitus, type 2 ([place])   . Diabetic peripheral neuropathy ([place])   . Dizziness   . Dyspnea on exertion   . Family history of adverse reaction to anesthesia    Mother has nausea  . GERD (gastroesophageal reflux disease)   . Gout   . Heart valve disorder   . History of blood transfusion 1981; ~ 2005; 12/2016   "childbirth; defibrillator OR; colostomy OR"  . History of mononucleosis 03/2014  . Hyperlipidemia   . Hypertension   . Hypothyroid   . Insomnia   . Intervertebral disc degeneration   . Ischemic cardiomyopathy   . LBBB (left bundle branch block)   . Myocardial infarction ([place]) dx'd ~ 2005  . Nonischemic cardiomyopathy ([place])    s/p ICD in 2005. EF has since recovered  . Obesity   . On home oxygen therapy    "2L at night" (06/22/2018)  . OSA on CPAP   . Pneumonia    "couple times; last time was 12/2016" (06/22/2018)  . PONV (postoperative nausea and vomiting)   . Presence of permanent cardiac pacemaker    [employer]  . Swelling   . Syncope   . Systemic hypertension   . Vitamin D deficiency     Past Surgical History:  Procedure Laterality Date  . APPENDECTOMY  07/13/2017  . BLADDER SUSPENSION  1990   "w/hysterectomy"  . BOWEL RESECTION N/A 07/09/2018   Procedure: SMALL BOWEL RESECTION;  Surgeon: Judeth Horn, MD;  Location: [facility];  Service: General;  Laterality: N/A;  . CARDIAC CATHETERIZATION  2005   no obstructive CAD per patient  . CARDIAC CATHETERIZATION  2018  . CARDIAC DEFIBRILLATOR PLACEMENT  2005   BiV ICD implanted,  LV lead is an epicardial lead  . CARPAL TUNNEL RELEASE Left 07/25/2014   Dr.Williamson   . CARPAL TUNNEL RELEASE Right 04/08/2019   Procedure: RIGHT CARPAL TUNNEL RELEASE ENDOSCOPIC;  Surgeon: Milly Jakob, MD;  Location: [facility];  Service: Orthopedics;  Laterality: Right;  . CATARACT EXTRACTION W/ INTRAOCULAR LENS  IMPLANT, BILATERAL Bilateral   . COLONIC STENT PLACEMENT N/A 08/31/2017   Procedure: COLONIC  STENT PLACEMENT;  Surgeon: Carol Ada, MD;  Location: [facility];  Service: Endoscopy;  Laterality: N/A;  . COLONOSCOPY     2010-2011 Dr.Kipreos   . COLOSTOMY  12/2016   Archie Endo 01/26/2017  . COLOSTOMY REVERSAL N/A 07/13/2017   Procedure: COLOSTOMY REVERSAL;  Surgeon: Judeth Horn, MD;  Location: [facility];  Service: General;  Laterality: N/A;  . CYST REMOVAL HAND Right 05/2013   thumb  . DILATION AND CURETTAGE OF UTERUS  X 5-6  . EXCISION MASS ABDOMINAL N/A 07/09/2018   Procedure: EXPLORATION OF  ABDOMINAL WOUND ERAS PATHWAY;  Surgeon: Judeth Horn, MD;  Location: [facility];  Service: General;  Laterality: N/A;  . FLEXIBLE SIGMOIDOSCOPY N/A 08/24/2017   Procedure: FLEXIBLE SIGMOIDOSCOPY;  Surgeon: Carol Ada, MD;  Location: [facility];  Service: Endoscopy;  Laterality: N/A;  . FLEXIBLE SIGMOIDOSCOPY N/A 08/31/2017   Procedure: FLEXIBLE SIGMOIDOSCOPY;  Surgeon: Carol Ada, MD;  Location: [facility];  Service: Endoscopy;  Laterality: N/A;  stent placement  . IMPLANTABLE CARDIOVERTER DEFIBRILLATOR GENERATOR CHANGE  2008  . INSERTION OF MESH N/A 07/09/2018   Procedure: INSERTION OF VICRYL MESH;  Surgeon: Judeth Horn, MD;  Location: [facility];  Service: General;  Laterality: N/A;  . LAPAROTOMY N/A 09/18/2017   Procedure: EXPLORATORY LAPAROTOMY;  Surgeon: Judeth Horn, MD;  Location: [facility];  Service: General;  Laterality: N/A;  . LEAD REVISION  06/22/2018  . LEAD REVISION/REPAIR N/A 06/22/2018   Procedure: LEAD REVISION/REPAIR;  Surgeon: Deboraha Sprang, MD;  Location: [facility];  Service: Cardiovascular;  Laterality: N/A;  . LYSIS OF ADHESION N/A 09/18/2017   Procedure: LYSIS OF ADHESION;  Surgeon: Judeth Horn, MD;  Location: [facility];  Service: General;  Laterality: N/A;  . LYSIS OF ADHESION N/A 07/09/2018   Procedure: LYSIS OF ADHESION;  Surgeon: Judeth Horn, MD;  Location: [facility];  Service: General;  Laterality: N/A;  . PARTIAL COLECTOMY N/A 09/18/2017   Procedure: ILEOCOLECTOMY;   Surgeon: Judeth Horn, MD;  Location: [facility];  Service: General;  Laterality: N/A;  . PARTIAL COLECTOMY  09/25/2017  . RIGHT/LEFT HEART CATH AND CORONARY ANGIOGRAPHY N/A 06/01/2018   Procedure: RIGHT/LEFT HEART CATH AND CORONARY ANGIOGRAPHY;  Surgeon: Jettie Booze, MD;  Location: [facility];  Service: Cardiovascular;  Laterality: N/A;  . SIGMOIDOSCOPY N/A 09/18/2017   Procedure: SIGMOIDOSCOPY;  Surgeon: Judeth Horn, MD;  Location: [facility];  Service: General;  Laterality: N/A;  . SMALL INTESTINE SURGERY  07/09/2018   EXPLORATION OF ABDOMINAL WOUND ERAS PATHWAYN/; MESH; LYSIS OF ADHESIONS  . TOTAL ABDOMINAL HYSTERECTOMY  1990  . TRIGGER FINGER RELEASE Right 05/213  . TRIGGER FINGER RELEASE Right 04/08/2019   Procedure: RIGHT INDEX RELEASE TRIGGER FINGER/A-1 PULLEY;  Surgeon: Milly Jakob, MD;  Location: [facility];  Service: Orthopedics;  Laterality: Right;  . TUBAL LIGATION  1980s  . VESICOVAGINAL FISTULA CLOSURE W/ TAH       Current Outpatient Medications  Medication Sig Dispense Refill  . acetaminophen (TYLENOL) 500 MG tablet Take 500 mg by mouth daily as needed for headache (pain).     Marland Kitchen albuterol (PROVENTIL HFA;VENTOLIN HFA) 108 (90 Base) MCG/ACT inhaler Inhale 2 puffs into the lungs every 6 (six) hours as needed for wheezing or shortness of breath.     . allopurinol (ZYLOPRIM) 100 MG tablet TAKE 1 TABLET(100 MG) BY MOUTH DAILY 90 tablet 1  . Calcium Carbonate-Vitamin D (CALTRATE 600+D) 600-400 MG-UNIT tablet Take 1 tablet by mouth 2 (two) times daily.    . carvedilol (COREG) 25 MG tablet Take 25 mg by mouth 2 (two) times daily.     . CRESTOR 10 MG tablet Take 1 tablet (10 mg total) by mouth at bedtime. 30 tablet 0  . DULoxetine (CYMBALTA) 30 MG capsule Take 1 capsule (30 mg total) by mouth daily. (Patient taking differently: Take 30 mg by mouth at bedtime. ) 90 capsule 0  . febuxostat (ULORIC) 40 MG tablet Take 40 mg by mouth daily.    Marland Kitchen gabapentin (NEURONTIN) 300 MG capsule  Take 300 mg by mouth at bedtime.    . insulin regular human CONCENTRATED (HUMULIN R U-500 KWIKPEN) 500  UNIT/ML kwikpen Inject 115-120 Units into the skin See admin instructions. Inject 120 units subcutaneously before breakfast and lunch, inject 115 units at bedtime    . levothyroxine (SYNTHROID, LEVOTHROID) 88 MCG tablet Take 88 mcg by mouth daily before breakfast.     . loratadine (CLARITIN) 10 MG tablet Take 10 mg by mouth daily.    . montelukast (SINGULAIR) 10 MG tablet Take 10 mg by mouth at bedtime.    Marland Kitchen nystatin ointment (MYCOSTATIN) Apply 1 application topically 2 (two) times daily. For yeast infection    . omega-3 acid ethyl esters (LOVAZA) 1 g capsule Take 4 g by mouth at bedtime.     . pantoprazole (PROTONIX) 40 MG tablet Take 40 mg by mouth at bedtime.   2  . PRESCRIPTION MEDICATION Inhale into the lungs at bedtime. CPAP with oxygen    . temazepam (RESTORIL) 15 MG capsule Take 1 capsule (15 mg total) by mouth at bedtime as needed for sleep. 30 capsule 0  . torsemide (DEMADEX) 20 MG tablet Take 30 mg by mouth daily.    . traMADol (ULTRAM) 50 MG tablet Take 1 tablet (50 mg total) by mouth every 6 (six) hours as needed for moderate pain or severe pain. 30 tablet 0   No current facility-administered medications for this visit.    Allergies:   Ace inhibitors, Glucophage [metformin hcl], Telmisartan, Advicor [niacin-lovastatin er], Bystolic [nebivolol hcl], Erythromycin, Lipitor [atorvastatin], Lopid [gemfibrozil], and Statins    Social History:  The patient  reports that she has never smoked. She has never used smokeless tobacco. She reports that she does not drink alcohol or use drugs.   Family History:  The patient's family history includes Anxiety disorder in her son; Arrhythmia in her mother; Asthma in her mother; Atrial fibrillation in her daughter; Breast cancer in her maternal grandmother; Cancer in her brother and father; Dementia in her brother; Diabetes in her daughter; Diabetes  Mellitus II in her mother and another family member; GER disease in her daughter; Heart disease in her father and another family member; Hypertension in her brother, daughter, daughter, daughter, father, mother, and son; Hypothyroidism in her son.    ROS:  Please see the history of present illness.   Otherwise, review of systems are positive for .   All other systems are reviewed and negative.    PHYSICAL EXAM: VS:  BP (!) 100/50   Pulse 86   Ht 5\' 5"  (1.651 m)   Wt 237 lb 12.8 oz (107.9 kg)   SpO2 93%   BMI 39.57 kg/m  , BMI Body mass index is 39.57 kg/m. GEN: Well nourished, well developed, in no acute distress  HEENT: normal  Neck: no JVD, carotid bruits, or masses Cardiac: RRR; no murmurs, rubs, or gallops,no edema  Respiratory:  clear to auscultation bilaterally, normal work of breathing GI: soft, nontender, nondistended, + BS MS: no deformity or atrophy  Skin: warm and dry, no rash Neuro:  Strength and sensation are intact Psych: euthymic mood, full affect   EKG:   The ekg ordered in Jan 2021 demonstrates V paced   Recent Labs: 08/07/2019: B Natriuretic Peptide 50.2 08/18/2019: Magnesium 2.6; TSH 2.438 08/19/2019: ALT 27 08/20/2019: BUN 45; Creatinine, Ser 2.12; Hemoglobin 10.5; Platelets 276; Potassium 4.4; Sodium 137   Lipid Panel    Component Value Date/Time   CHOL 186 11/22/2017 1507   TRIG 279 (H) 11/22/2017 1507   HDL 48 (L) 11/22/2017 1507   CHOLHDL 3.9 11/22/2017 1507  [place] 98 11/22/2017 1507     Other studies Reviewed: Additional studies/ records that were reviewed today with results demonstrating: PMD labs reviewed.   ASSESSMENT AND PLAN:  1. NICM: Appears euvolemic. On carvedilol. See below. 2. LBBB: atrial tach noted in the past. BiV AICD.  Followed by Dr. Caryl Comes.  3. CKD: Limits use of CHF meds. No ACE-I.  Hydralazine with nitrates may be the best choice.  On Carvedilol and torsemide.  BP controlled at home in the 130-140 range.  No readings at  home in the 95-284 systolic range.  144/65 at Dr. Silvestre Mesi  office.  Will start hydralazine 25 mg TID. Cr 2.2 4. Hyperlipidemia: Well controlled. 5. DM: A1C.  Increase activity.   6. CPAP used for OSA.  7. We attempted to schedule her vaccine in the office today but she prefers to call on her own.    Current medicines are reviewed at length with the patient today.  The patient concerns regarding her medicines were addressed.  The following changes have been made:  Start hydralazine  Labs/ tests ordered today include:  No orders of the defined types were placed in this encounter.   Recommend 150 minutes/week of aerobic exercise Low fat, low carb, high fiber diet recommended  Disposition:   FU in 3-4 weeks   Signed, Larae Grooms, MD  11/13/2019 9:13 AM    [facility] [address], [place], [place]  [address] Phone: [phone]; Fax: [phone]

## 2019-11-12 DIAGNOSIS — K921 Melena: Secondary | ICD-10-CM | POA: Diagnosis not present

## 2019-11-13 ENCOUNTER — Encounter: Payer: Self-pay | Admitting: Interventional Cardiology

## 2019-11-13 ENCOUNTER — Ambulatory Visit (INDEPENDENT_AMBULATORY_CARE_PROVIDER_SITE_OTHER): Payer: Medicare Other | Admitting: Interventional Cardiology

## 2019-11-13 ENCOUNTER — Other Ambulatory Visit: Payer: Self-pay

## 2019-11-13 VITALS — BP 100/50 | HR 86 | Ht 65.0 in | Wt 237.8 lb

## 2019-11-13 DIAGNOSIS — Z794 Long term (current) use of insulin: Secondary | ICD-10-CM

## 2019-11-13 DIAGNOSIS — E782 Mixed hyperlipidemia: Secondary | ICD-10-CM | POA: Diagnosis not present

## 2019-11-13 DIAGNOSIS — E1165 Type 2 diabetes mellitus with hyperglycemia: Secondary | ICD-10-CM | POA: Diagnosis not present

## 2019-11-13 DIAGNOSIS — I428 Other cardiomyopathies: Secondary | ICD-10-CM | POA: Diagnosis not present

## 2019-11-13 DIAGNOSIS — Z95810 Presence of automatic (implantable) cardiac defibrillator: Secondary | ICD-10-CM | POA: Diagnosis not present

## 2019-11-13 DIAGNOSIS — I447 Left bundle-branch block, unspecified: Secondary | ICD-10-CM

## 2019-11-13 DIAGNOSIS — N1832 Chronic kidney disease, stage 3b: Secondary | ICD-10-CM | POA: Diagnosis not present

## 2019-11-13 MED ORDER — HYDRALAZINE HCL 25 MG PO TABS: 25.0000 mg | ORAL_TABLET | Freq: Three times a day (TID) | ORAL | 3 refills | Status: DC

## 2019-11-13 NOTE — Patient Instructions (Signed)
Medication Instructions:  Your physician has recommended you make the following change in your medication:   START: hydralazine 25 mg tablet: Take 1 tablet three times a day  *If you need a refill on your cardiac medications before your next appointment, please call your pharmacy*   Lab Work: None ordered If you have labs (blood work) drawn today and your tests are completely normal, you will receive your results only by: Marland Kitchen MyChart Message (if you have MyChart) OR . A paper copy in the mail If you have any lab test that is abnormal or we need to change your treatment, we will call you to review the results.   Testing/Procedures: None ordered   Follow-Up: Follow up with Dr. Irish Lack on 12/09/19 at 9:00 AM   Other Instructions We are recommending the COVID-19 vaccine to all of our patients. Cardiac medications (including blood thinners) should not deter anyone from being vaccinated and there is no need to hold any of those medications prior to vaccine administration.   Currently, there is a hotline to call (active 08/02/19) to schedule vaccination appointments as no walk-ins will be accepted.    Vaccines through the health department can be arranged by calling [phone]    Vaccines through [facility] can be arranged by calling [phone] or visiting [email or web address]   If you have further questions or concerns about the vaccine process, please visit [email or web address], [email or web address], or contact your primary care physician.

## 2019-11-14 ENCOUNTER — Other Ambulatory Visit: Payer: Self-pay | Admitting: Neurological Surgery

## 2019-11-14 ENCOUNTER — Telehealth: Payer: Self-pay | Admitting: Nurse Practitioner

## 2019-11-14 DIAGNOSIS — M48062 Spinal stenosis, lumbar region with neurogenic claudication: Secondary | ICD-10-CM

## 2019-11-14 DIAGNOSIS — Z6841 Body Mass Index (BMI) 40.0 and over, adult: Secondary | ICD-10-CM | POA: Diagnosis not present

## 2019-11-14 DIAGNOSIS — I10 Essential (primary) hypertension: Secondary | ICD-10-CM | POA: Diagnosis not present

## 2019-11-14 NOTE — Telephone Encounter (Signed)
Phone call to patient to verify medication list and allergies for myelogram procedure. Pt instructed to hold Cymbalta and Tramadol for 48hrs prior to myelogram appointment time. Pt verbalized understanding. Pre and post procedure instructions reviewed with pt.

## 2019-11-16 ENCOUNTER — Ambulatory Visit: Payer: Medicare Other | Attending: Family Medicine

## 2019-11-16 ENCOUNTER — Other Ambulatory Visit: Payer: Self-pay

## 2019-11-16 DIAGNOSIS — Z23 Encounter for immunization: Secondary | ICD-10-CM

## 2019-11-16 NOTE — Progress Notes (Signed)
   Covid-19 Vaccination Clinic  Name:  Crystal [name]    MRN: [id number] DOB: 10-13-1950  11/16/2019  [name] was observed post Covid-19 immunization for 15 minutes without incident. She was provided with Vaccine Information Sheet and instruction to access the V-Safe system.   [name] was instructed to call 911 with any severe reactions post vaccine: Marland Kitchen Difficulty breathing  . Swelling of face and throat  . A fast heartbeat  . A bad rash all over body  . Dizziness and weakness   Immunizations Administered    Name Date Dose VIS Date Route   Pfizer COVID-19 Vaccine 11/16/2019 10:51 AM 0.3 mL 09/18/2018 Intramuscular   Manufacturer: [employer]   Lot: [id number]   [place]: [id number]

## 2019-11-21 DIAGNOSIS — N1832 Chronic kidney disease, stage 3b: Secondary | ICD-10-CM | POA: Diagnosis not present

## 2019-11-22 ENCOUNTER — Ambulatory Visit
Admission: RE | Admit: 2019-11-22 | Discharge: 2019-11-22 | Disposition: A | Discharge status: Home / Self Care | Payer: Medicare Other | Source: Ambulatory Visit | Attending: Neurological Surgery | Admitting: Neurological Surgery

## 2019-11-22 DIAGNOSIS — M48062 Spinal stenosis, lumbar region with neurogenic claudication: Secondary | ICD-10-CM

## 2019-11-22 MED ORDER — IOPAMIDOL (ISOVUE-M 200) INJECTION 41%
18.0000 mL | Freq: Once | INTRAMUSCULAR | Status: AC
  Administered 2019-11-22: 18 mL via INTRATHECAL

## 2019-11-22 MED ORDER — ONDANSETRON HCL 4 MG/2ML IJ SOLN
4.0000 mg | Freq: Once | INTRAMUSCULAR | Status: AC
  Administered 2019-11-22: 4 mg via INTRAMUSCULAR

## 2019-11-22 MED ORDER — DIAZEPAM 5 MG PO TABS
5.0000 mg | ORAL_TABLET | Freq: Once | ORAL | Status: AC
  Administered 2019-11-22: 5 mg via ORAL

## 2019-11-22 MED ORDER — MEPERIDINE HCL 50 MG/ML IJ SOLN
50.0000 mg | Freq: Once | INTRAMUSCULAR | Status: AC
  Administered 2019-11-22: 50 mg via INTRAMUSCULAR

## 2019-11-22 NOTE — Progress Notes (Signed)
Pt reports she has been off of Cymbalta and Tramadol for at least 48 hours.

## 2019-11-22 NOTE — Progress Notes (Signed)
Patient states she has been off Tramadol for at least the past two days.

## 2019-11-22 NOTE — Discharge Instructions (Signed)
Myelogram Discharge Instructions  1. Go home and rest quietly for the next 24 hours.  It is important to lie flat for the next 24 hours.  Get up only to go to the restroom.  You may lie in the bed or on a couch on your back, your stomach, your left side or your right side.  You may have one pillow under your head.  You may have pillows between your knees while you are on your side or under your knees while you are on your back.  2. DO NOT drive today.  Recline the seat as far back as it will go, while still wearing your seat belt, on the way home.  3. You may get up to go to the bathroom as needed.  You may sit up for 10 minutes to eat.  You may resume your normal diet and medications unless otherwise indicated.  Drink lots of extra fluids today and tomorrow.  4. The incidence of headache, nausea, or vomiting is about 5% (one in 20 patients).  If you develop a headache, lie flat and drink plenty of fluids until the headache goes away.  Caffeinated beverages may be helpful.  If you develop severe nausea and vomiting or a headache that does not go away with flat bed rest, call [phone].  5. You may resume normal activities after your 24 hours of bed rest is over; however, do not exert yourself strongly or do any heavy lifting tomorrow. If when you get up you have a headache when standing, go back to bed and force fluids for another 24 hours.  6. Call your physician for a follow-up appointment.  The results of your myelogram will be sent directly to your physician by the following day.  7. If you have any questions or if complications develop after you arrive home, please call [phone].  Discharge instructions have been explained to the patient.  The patient, or the person responsible for the patient, fully understands these instructions  YOU MAY RESUME YOUR CYMBALTA AND TRAMADOL TOMORROW 11/23/2019 AFTER 9:30 AM

## 2019-12-04 DIAGNOSIS — M48062 Spinal stenosis, lumbar region with neurogenic claudication: Secondary | ICD-10-CM | POA: Diagnosis not present

## 2019-12-04 DIAGNOSIS — Z6841 Body Mass Index (BMI) 40.0 and over, adult: Secondary | ICD-10-CM | POA: Diagnosis not present

## 2019-12-06 ENCOUNTER — Telehealth: Payer: Self-pay | Admitting: *Deleted

## 2019-12-06 NOTE — Progress Notes (Signed)
Cardiology Office Note   Date:  12/09/2019   ID:  Crystal [name], DOB 1951/06/20, MRN [id number]  PCP:  Crist Infante, MD    No chief complaint on file.  NICM  Wt Readings from Last 3 Encounters:  12/09/19 245 lb (111.1 kg)  11/13/19 237 lb 12.8 oz (107.9 kg)  08/20/19 229 lb 11.5 oz (104.2 kg)       History of Present Illness: Crystal [name] is a 69 y.o. female  With a h/o, per the old chart,: "Chronic systolic heart failure  - long hx of NICM dating back to 2005, with reported LVEF at that time 24% - cath at that time showed no significant CAD.  - She has a chronic LBBB. Had a BiV AICD placed previously with epicardial LV lead with subsequent normalization of LVEF - LVEF improved with BiV pacing to >55%.  - from notes device reached ERI, her RV lead has increasing thresholds around that time. It was decided not to replace the device since normal LVEF and follow her clinically.   - repeat echo 04/2014 LVEF 50-55% - questionable hx of angioedema on ACE-Is, she states has been on ARBs and has tolerated."  She has CKD as well.  She had echo, cath in 2018 in [place], [place]. No CAD and EF 50% per her report.    SHe has had other medical problems. She had a colostomy for colitis. It has been reversed.   She has been gaining weight since that time. She has gained 30 lbs.  SHe had carpal tunnel surgery.  SHe has been followed by Dr. Caryl [name] as well. 11/19 cath showed:  Prox LAD lesion is 25% stenosed.  Ost 1st Diag lesion is 25% stenosed.  Mid LM lesion is 10% stenosed.  LV end diastolic pressure is mildly elevated. LVEDP 17 mm Hg.  There is no aortic valve stenosis.  Hemodynamic findings consistent with mild pulmonary hypertension.  Ao sat 98%, PA sat 66%, CO 4.79 L/min; CI 2.2, mean PA 30 mm Hg; mean PCWP 22 mm Hg  No significant CAD.   Medical therapy for LV dysfunction.  Echo in 09/2018 showed: The left ventricle has severely reduced  systolic function, with an ejection fraction of 25-30%. The cavity size was moderately dilated. There is mildly increased left ventricular wall thickness. Left ventricular diastolic Doppler parameters are  consistent with impaired relaxation. Left ventricular diffuse hypokinesis. 2. The right ventricle has normal systolic function. The cavity was normal. There is no increase in right ventricular wall thickness. 3. Mild thickening of the mitral valve leaflet. There is mild mitral annular calcification present. 4. The tricuspid valve is grossly normal. 5. The aortic valve is tricuspid Mild thickening of the aortic valve. 6. There is mild dilatation of the aortic root and of the ascending aorta measuring 39 mm. 7. Severe global reduction in LV systolic function; mild diastolic dysfunction; moderate LVE; mild LVH; mild MR.  Had AKI with increased diuretic.   She was hospitalized with COVID inJan 2021. Treated with Decadron and remdesivir.   She is now limited by back pain and may need surgery.  Denies : Chest pain. Dizziness. Leg edema. Nitroglycerin use. Orthopnea. Palpitations. Paroxysmal nocturnal dyspnea.  Syncope.   Reports DOE. Last echo was 2020 march.  Seeing renal every 4 months.  She got her Pfizer vaccine, first shot, and second is tomorrow.   Past Medical History:  Diagnosis Date  . AICD (automatic cardioverter/defibrillator) present    high RV  threshold chronically, device was turned off in 2014; Device battery has been dead x 7 years; "turned it back on 06/22/2018"  . Anemia, iron deficiency   . Angioedema    felt to likely be due to ace inhibitors but says she has had this even off of medicines, appears to be tolerating ARBs chronically  . Anxiety   . Arthritis    "hands, legs, arms; bad in my back" (06/22/2018)  . Asthmatic bronchitis with status asthmaticus   . Bradycardia   . CHF (congestive heart failure) ([place])   . Chronic lower back pain   . Chronic pain     . Chronic renal insufficiency   . CKD (chronic kidney disease), stage III   . Coronary artery disease    Mild nonobstructive (30% LAD, 30% RCA) by 09/01/16 cath at [facility]  . Degenerative joint disease   . Depression   . Diabetes mellitus, type 2 ([place])   . Diabetic peripheral neuropathy ([place])   . Dizziness   . Dyspnea on exertion   . Family history of adverse reaction to anesthesia    Mother has nausea  . GERD (gastroesophageal reflux disease)   . Gout   . Heart valve disorder   . History of blood transfusion 1981; ~ 2005; 12/2016   "childbirth; defibrillator OR; colostomy OR"  . History of mononucleosis 03/2014  . Hyperlipidemia   . Hypertension   . Hypothyroid   . Insomnia   . Intervertebral disc degeneration   . Ischemic cardiomyopathy   . LBBB (left bundle branch block)   . Myocardial infarction ([place]) dx'd ~ 2005  . Nonischemic cardiomyopathy ([place])    s/p ICD in 2005. EF has since recovered  . Obesity   . On home oxygen therapy    "2L at night" (06/22/2018)  . OSA on CPAP   . Pneumonia    "couple times; last time was 12/2016" (06/22/2018)  . PONV (postoperative nausea and vomiting)   . Presence of permanent cardiac pacemaker    [employer]  . Swelling   . Syncope   . Systemic hypertension   . Vitamin D deficiency     Past Surgical History:  Procedure Laterality Date  . APPENDECTOMY  07/13/2017  . BLADDER SUSPENSION  1990   "w/hysterectomy"  . BOWEL RESECTION N/A 07/09/2018   Procedure: SMALL BOWEL RESECTION;  Surgeon: Judeth Horn, MD;  Location: [facility];  Service: General;  Laterality: N/A;  . CARDIAC CATHETERIZATION  2005   no obstructive CAD per patient  . CARDIAC CATHETERIZATION  2018  . CARDIAC DEFIBRILLATOR PLACEMENT  2005   BiV ICD implanted,  LV lead is an epicardial lead  . CARPAL TUNNEL RELEASE Left 07/25/2014   Dr.Williamson   . CARPAL TUNNEL RELEASE Right 04/08/2019   Procedure: RIGHT CARPAL TUNNEL RELEASE ENDOSCOPIC;  Surgeon:  Milly Jakob, MD;  Location: [facility];  Service: Orthopedics;  Laterality: Right;  . CATARACT EXTRACTION W/ INTRAOCULAR LENS  IMPLANT, BILATERAL Bilateral   . COLONIC STENT PLACEMENT N/A 08/31/2017   Procedure: COLONIC STENT PLACEMENT;  Surgeon: Carol Ada, MD;  Location: [facility];  Service: Endoscopy;  Laterality: N/A;  . COLONOSCOPY     2010-2011 Dr.Kipreos   . COLOSTOMY  12/2016   Archie Endo 01/26/2017  . COLOSTOMY REVERSAL N/A 07/13/2017   Procedure: COLOSTOMY REVERSAL;  Surgeon: Judeth Horn, MD;  Location: [facility];  Service: General;  Laterality: N/A;  . CYST REMOVAL HAND Right 05/2013   thumb  . DILATION AND CURETTAGE OF  UTERUS  X 5-6  . EXCISION MASS ABDOMINAL N/A 07/09/2018   Procedure: EXPLORATION OF  ABDOMINAL WOUND ERAS PATHWAY;  Surgeon: Judeth Horn, MD;  Location: [facility];  Service: General;  Laterality: N/A;  . FLEXIBLE SIGMOIDOSCOPY N/A 08/24/2017   Procedure: Beryle Quant;  Surgeon: Carol Ada, MD;  Location: [facility];  Service: Endoscopy;  Laterality: N/A;  . FLEXIBLE SIGMOIDOSCOPY N/A 08/31/2017   Procedure: FLEXIBLE SIGMOIDOSCOPY;  Surgeon: Carol Ada, MD;  Location: [facility];  Service: Endoscopy;  Laterality: N/A;  stent placement  . IMPLANTABLE CARDIOVERTER DEFIBRILLATOR GENERATOR CHANGE  2008  . INSERTION OF MESH N/A 07/09/2018   Procedure: INSERTION OF VICRYL MESH;  Surgeon: Judeth Horn, MD;  Location: [facility];  Service: General;  Laterality: N/A;  . LAPAROTOMY N/A 09/18/2017   Procedure: EXPLORATORY LAPAROTOMY;  Surgeon: Judeth Horn, MD;  Location: [facility];  Service: General;  Laterality: N/A;  . LEAD REVISION  06/22/2018  . LEAD REVISION/REPAIR N/A 06/22/2018   Procedure: LEAD REVISION/REPAIR;  Surgeon: Deboraha Sprang, MD;  Location: [facility];  Service: Cardiovascular;  Laterality: N/A;  . LYSIS OF ADHESION N/A 09/18/2017   Procedure: LYSIS OF ADHESION;  Surgeon: Judeth Horn, MD;  Location: [facility];  Service: General;  Laterality: N/A;  .  LYSIS OF ADHESION N/A 07/09/2018   Procedure: LYSIS OF ADHESION;  Surgeon: Judeth Horn, MD;  Location: [facility];  Service: General;  Laterality: N/A;  . PARTIAL COLECTOMY N/A 09/18/2017   Procedure: ILEOCOLECTOMY;  Surgeon: Judeth Horn, MD;  Location: [facility];  Service: General;  Laterality: N/A;  . PARTIAL COLECTOMY  09/25/2017  . RIGHT/LEFT HEART CATH AND CORONARY ANGIOGRAPHY N/A 06/01/2018   Procedure: RIGHT/LEFT HEART CATH AND CORONARY ANGIOGRAPHY;  Surgeon: Jettie Booze, MD;  Location: [facility];  Service: Cardiovascular;  Laterality: N/A;  . SIGMOIDOSCOPY N/A 09/18/2017   Procedure: SIGMOIDOSCOPY;  Surgeon: Judeth Horn, MD;  Location: [facility];  Service: General;  Laterality: N/A;  . SMALL INTESTINE SURGERY  07/09/2018   EXPLORATION OF ABDOMINAL WOUND ERAS PATHWAYN/; MESH; LYSIS OF ADHESIONS  . TOTAL ABDOMINAL HYSTERECTOMY  1990  . TRIGGER FINGER RELEASE Right 05/213  . TRIGGER FINGER RELEASE Right 04/08/2019   Procedure: RIGHT INDEX RELEASE TRIGGER FINGER/A-1 PULLEY;  Surgeon: Milly Jakob, MD;  Location: [facility];  Service: Orthopedics;  Laterality: Right;  . TUBAL LIGATION  1980s  . VESICOVAGINAL FISTULA CLOSURE W/ TAH       Current Outpatient Medications  Medication Sig Dispense Refill  . acetaminophen (TYLENOL) 500 MG tablet Take 500 mg by mouth daily as needed for headache (pain).     Marland Kitchen albuterol (PROVENTIL HFA;VENTOLIN HFA) 108 (90 Base) MCG/ACT inhaler Inhale 2 puffs into the lungs every 6 (six) hours as needed for wheezing or shortness of breath.     . allopurinol (ZYLOPRIM) 100 MG tablet TAKE 1 TABLET(100 MG) BY MOUTH DAILY 90 tablet 1  . Calcium Carbonate-Vitamin D (CALTRATE 600+D) 600-400 MG-UNIT tablet Take 1 tablet by mouth 2 (two) times daily.    . carvedilol (COREG) 25 MG tablet Take 25 mg by mouth 2 (two) times daily.     . CRESTOR 10 MG tablet Take 1 tablet (10 mg total) by mouth at bedtime. 30 tablet 0  . DULoxetine (CYMBALTA) 60 MG capsule Take 60 mg by  mouth daily.    . febuxostat (ULORIC) 40 MG tablet Take 40 mg by mouth daily.    Marland Kitchen gabapentin (NEURONTIN) 300 MG capsule Take 300 mg by  mouth at bedtime.    . hydrALAZINE (APRESOLINE) 25 MG tablet Take 1 tablet (25 mg total) by mouth 3 (three) times daily. 270 tablet 3  . insulin regular human CONCENTRATED (HUMULIN R U-500 KWIKPEN) 500 UNIT/ML kwikpen Inject 115-120 Units into the skin See admin instructions. Inject 120 units subcutaneously before breakfast and lunch, inject 115 units at bedtime    . levothyroxine (SYNTHROID, LEVOTHROID) 88 MCG tablet Take 88 mcg by mouth daily before breakfast.     . loratadine (CLARITIN) 10 MG tablet Take 10 mg by mouth daily.    . montelukast (SINGULAIR) 10 MG tablet Take 10 mg by mouth at bedtime.    Marland Kitchen omega-3 acid ethyl esters (LOVAZA) 1 g capsule Take 4 g by mouth at bedtime.     . pantoprazole (PROTONIX) 40 MG tablet Take 40 mg by mouth at bedtime.   2  . PRESCRIPTION MEDICATION Inhale into the lungs at bedtime. CPAP with oxygen    . temazepam (RESTORIL) 15 MG capsule Take 1 capsule (15 mg total) by mouth at bedtime as needed for sleep. 30 capsule 0  . torsemide (DEMADEX) 20 MG tablet Take 20 mg by mouth daily.     . traMADol (ULTRAM) 50 MG tablet Take 1 tablet (50 mg total) by mouth every 6 (six) hours as needed for moderate pain or severe pain. 30 tablet 0   No current facility-administered medications for this visit.    Allergies:   Ace inhibitors, Glucophage [metformin hcl], Telmisartan, Advicor [niacin-lovastatin er], Bystolic [nebivolol hcl], Erythromycin, Lipitor [atorvastatin], Lopid [gemfibrozil], and Statins    Social History:  The patient  reports that she has never smoked. She has never used smokeless tobacco. She reports that she does not drink alcohol or use drugs.   Family History:  The patient's family history includes Anxiety disorder in her son; Arrhythmia in her mother; Asthma in her mother; Atrial fibrillation in her daughter; Breast  cancer in her maternal grandmother; Cancer in her brother and father; Dementia in her brother; Diabetes in her daughter; Diabetes Mellitus II in her mother and another family member; GER disease in her daughter; Heart disease in her father and another family member; Hypertension in her brother, daughter, daughter, daughter, father, mother, and son; Hypothyroidism in her son.    ROS:  Please see the history of present illness.   Otherwise, review of systems are positive for back pain.   All other systems are reviewed and negative.    PHYSICAL EXAM: VS:  BP (!) 110/56   Pulse 76   Ht 5\' 5"  (1.651 m)   Wt 245 lb (111.1 kg)   SpO2 96%   BMI 40.77 kg/m  , BMI Body mass index is 40.77 kg/m. GEN: Well nourished, well developed, in no acute distress  HEENT: normal  Neck: no JVD, carotid bruits, or masses Cardiac: RRR; no murmurs, rubs, or gallops,no edema  Respiratory:  clear to auscultation bilaterally, normal work of breathing GI: soft, nontender, nondistended, + BS MS: no deformity or atrophy  Skin: warm and dry, no rash Neuro:  Strength and sensation are intact Psych: euthymic mood, full affect   EKG:   The ekg ordered in Jan 2021 demonstrates A sensed, V paced    Recent Labs: 08/07/2019: B Natriuretic Peptide 50.2 08/18/2019: Magnesium 2.6; TSH 2.438 08/19/2019: ALT 27 08/20/2019: BUN 45; Creatinine, Ser 2.12; Hemoglobin 10.5; Platelets 276; Potassium 4.4; Sodium 137   Lipid Panel    Component Value Date/Time   CHOL 186 11/22/2017  1507   TRIG 279 (H) 11/22/2017 1507   HDL 48 (L) 11/22/2017 1507   CHOLHDL 3.9 11/22/2017 1507   LDLCALC 98 11/22/2017 1507     Other studies Reviewed: Additional studies/ records that were reviewed today with results demonstrating: PMD labs reviewed.   ASSESSMENT AND PLAN:  1. Nonischemic cardiomyopathy: Trying to intensify medical therapy.  In April 2021, hydralazine was added.  Not a candidate for ACE inhibitor due to chronic kidney disease.   Cannot add more meds due to BP.  Given possibility of surgery, will check echo. No CAD in 2019.  I think it should be ok to do back surgery from a cardiac perspective, but perioperative management will be affected by LVEF. Cc: Dr. Zada Finders. 2. Left bundle branch block: BiV ICD in place.  Followed by Dr. Caryl [name]. 3. CKD: This limits the use of her CHF medicines.  Hydralazine was started.  Also on carvedilol and torsemide.  2.2 in 4/21.  SHe states it was 1.5 more recently with nephrologist.  4. Hyperlipidemia: Whole food, plant-based diet recommended. Taking Crestor.  TG elevated in 4/21. 5. Diabetes: Increase activity as back allows.  A1C 7.6.  No longer walking a mile a day. 6. OSA: Using CPAP.   Current medicines are reviewed at length with the patient today.  The patient concerns regarding her medicines were addressed.  The following changes have been made:  No change  Labs/ tests ordered today include: echo No orders of the defined types were placed in this encounter.   Recommend 150 minutes/week of aerobic exercise Low fat, low carb, high fiber diet recommended  Disposition:   FU in 6 months   Signed, Larae Grooms, MD  12/09/2019 9:46 AM    [facility] [address], [place], [place]  [address] Phone: [phone]; Fax: [phone]

## 2019-12-06 NOTE — Telephone Encounter (Signed)
   [facility] Pre-operative Risk Assessment    HEARTCARE STAFF: - Please ensure there is not already an duplicate clearance open for this procedure - Under Visit Info/Reason for Call, type in Other and utilize the format Clearance MM/DD/YY or Clearance TBD  Request for surgical clearance:  1. What type of surgery is being performed? L4-5, L5-S1 OPEN LUMBAR DECOMPRESSION WITH TRANSFORMAMINAL LUMBAR INTERBODY FUSION   2. When is this surgery scheduled? TBD   3. What type of clearance is required (medical clearance vs. Pharmacy clearance to hold med vs. Both)? MEDICAL  4. Are there any medications that need to be held prior to surgery and how long? NONE LISTED   5. Practice name and name of physician performing surgery? [facility]; DR. Marcello Moores OSTERGARD   6. What is the office phone number?  [phone]   7.   What is the office fax number?  [place]: JESSICA  8.   Anesthesia type (None, local, MAC, general) ? GENERAL   Julaine Hua 12/06/2019, 12:30 PM  _________________________________________________________________   (provider comments below)

## 2019-12-09 ENCOUNTER — Encounter: Payer: Self-pay | Admitting: Interventional Cardiology

## 2019-12-09 ENCOUNTER — Other Ambulatory Visit: Payer: Self-pay

## 2019-12-09 ENCOUNTER — Ambulatory Visit (INDEPENDENT_AMBULATORY_CARE_PROVIDER_SITE_OTHER): Payer: Medicare Other | Admitting: Interventional Cardiology

## 2019-12-09 VITALS — BP 110/56 | HR 76 | Ht 65.0 in | Wt 245.0 lb

## 2019-12-09 DIAGNOSIS — G4733 Obstructive sleep apnea (adult) (pediatric): Secondary | ICD-10-CM | POA: Diagnosis not present

## 2019-12-09 DIAGNOSIS — Z794 Long term (current) use of insulin: Secondary | ICD-10-CM

## 2019-12-09 DIAGNOSIS — N1832 Chronic kidney disease, stage 3b: Secondary | ICD-10-CM

## 2019-12-09 DIAGNOSIS — E782 Mixed hyperlipidemia: Secondary | ICD-10-CM | POA: Diagnosis not present

## 2019-12-09 DIAGNOSIS — E1165 Type 2 diabetes mellitus with hyperglycemia: Secondary | ICD-10-CM | POA: Diagnosis not present

## 2019-12-09 DIAGNOSIS — I447 Left bundle-branch block, unspecified: Secondary | ICD-10-CM | POA: Diagnosis not present

## 2019-12-09 DIAGNOSIS — Z9989 Dependence on other enabling machines and devices: Secondary | ICD-10-CM | POA: Diagnosis not present

## 2019-12-09 DIAGNOSIS — I428 Other cardiomyopathies: Secondary | ICD-10-CM | POA: Diagnosis not present

## 2019-12-09 NOTE — Patient Instructions (Addendum)
Medication Instructions:  Your physician recommends that you continue on your current medications as directed. Please refer to the Current Medication list given to you today.  *If you need a refill on your cardiac medications before your next appointment, please call your pharmacy*   Lab Work: None ordered  If you have labs (blood work) drawn today and your tests are completely normal, you will receive your results only by: Marland Kitchen MyChart Message (if you have MyChart) OR . A paper copy in the mail If you have any lab test that is abnormal or we need to change your treatment, we will call you to review the results.   Testing/Procedures: Your physician has requested that you have an echocardiogram. Echocardiography is a painless test that uses sound waves to create images of your heart. It provides your doctor with information about the size and shape of your heart and how well your heart's chambers and valves are working. This procedure takes approximately one hour. There are no restrictions for this procedure.  Follow-Up: At [facility], you and your health needs are our priority.  As part of our continuing mission to provide you with exceptional heart care, we have created designated Provider Care Teams.  These Care Teams include your primary Cardiologist (physician) and Advanced Practice Providers (APPs -  Physician Assistants and Nurse Practitioners) who all work together to provide you with the care you need, when you need it.  We recommend signing up for the patient portal called "MyChart".  Sign up information is provided on this After Visit Summary.  MyChart is used to connect with patients for Virtual Visits (Telemedicine).  Patients are able to view lab/test results, encounter notes, upcoming appointments, etc.  Non-urgent messages can be sent to your provider as well.   To learn more about what you can do with MyChart, go to [email or web address].    Your next appointment:   6  month(s)  The format for your next appointment:   In Person  Provider:   You may see Larae Grooms, MD or one of the following Advanced Practice Providers on your designated Care Team:    Melina Copa, PA-C  Ermalinda Barrios, PA-C    Other Instructions Your provider recommends that you maintain 150 minutes per week of moderate aerobic activity.   High-Fiber Diet Fiber, also called dietary fiber, is a type of carbohydrate that is found in fruits, vegetables, whole grains, and beans. A high-fiber diet can have many health benefits. Your health care provider may recommend a high-fiber diet to help:  Prevent constipation. Fiber can make your bowel movements more regular.  Lower your cholesterol.  Relieve the following conditions: ? Swelling of veins in the anus (hemorrhoids). ? Swelling and irritation (inflammation) of specific areas of the digestive tract (uncomplicated diverticulosis). ? A problem of the large intestine (colon) that sometimes causes pain and diarrhea (irritable bowel syndrome, IBS).  Prevent overeating as part of a weight-loss plan.  Prevent heart disease, type 2 diabetes, and certain cancers. What is my plan? The recommended daily fiber intake in grams (g) includes:  38 g for men age 87 or younger.  30 g for men over age 42.  28 g for women age 3 or younger.  21 g for women over age 59. You can get the recommended daily intake of dietary fiber by:  Eating a variety of fruits, vegetables, grains, and beans.  Taking a fiber supplement, if it is not possible to get enough fiber through your diet.  What do I need to know about a high-fiber diet?  It is better to get fiber through food sources rather than from fiber supplements. There is not a lot of research about how effective supplements are.  Always check the fiber content on the nutrition facts label of any prepackaged food. Look for foods that contain 5 g of fiber or more per serving.  Talk with a  diet and nutrition specialist (dietitian) if you have questions about specific foods that are recommended or not recommended for your medical condition, especially if those foods are not listed below.  Gradually increase how much fiber you consume. If you increase your intake of dietary fiber too quickly, you may have bloating, cramping, or gas.  Drink plenty of water. Water helps you to digest fiber. What are tips for following this plan?  Eat a wide variety of high-fiber foods.  Make sure that half of the grains that you eat each day are whole grains.  Eat breads and cereals that are made with whole-grain flour instead of refined flour or white flour.  Eat brown rice, bulgur wheat, or millet instead of white rice.  Start the day with a breakfast that is high in fiber, such as a cereal that contains 5 g of fiber or more per serving.  Use beans in place of meat in soups, salads, and pasta dishes.  Eat high-fiber snacks, such as berries, raw vegetables, nuts, and popcorn.  Choose whole fruits and vegetables instead of processed forms like juice or sauce. What foods can I eat?  Fruits Berries. Pears. Apples. Oranges. Avocado. Prunes and raisins. Dried figs. Vegetables Sweet potatoes. Spinach. Kale. Artichokes. Cabbage. Broccoli. Cauliflower. Green peas. Carrots. Squash. Grains Whole-grain breads. Multigrain cereal. Oats and oatmeal. Brown rice. Barley. Bulgur wheat. Maxeys. Quinoa. Bran muffins. Popcorn. Rye wafer crackers. Meats and other proteins Navy, kidney, and pinto beans. Soybeans. Split peas. Lentils. Nuts and seeds. Dairy Fiber-fortified yogurt. Beverages Fiber-fortified soy milk. Fiber-fortified orange juice. Other foods Fiber bars. The items listed above may not be a complete list of recommended foods and beverages. Contact a dietitian for more options. What foods are not recommended? Fruits Fruit juice. Cooked, strained fruit. Vegetables Fried potatoes. Canned  vegetables. Well-cooked vegetables. Grains White bread. Pasta made with refined flour. White rice. Meats and other proteins Fatty cuts of meat. Fried chicken or fried fish. Dairy Milk. Yogurt. Cream cheese. Sour cream. Fats and oils Butters. Beverages Soft drinks. Other foods Cakes and pastries. The items listed above may not be a complete list of foods and beverages to avoid. Contact a dietitian for more information. Summary  Fiber is a type of carbohydrate. It is found in fruits, vegetables, whole grains, and beans.  There are many health benefits of eating a high-fiber diet, such as preventing constipation, lowering blood cholesterol, helping with weight loss, and reducing your risk of heart disease, diabetes, and certain cancers.  Gradually increase your intake of fiber. Increasing too fast can result in cramping, bloating, and gas. Drink plenty of water while you increase your fiber.  The best sources of fiber include whole fruits and vegetables, whole grains, nuts, seeds, and beans. This information is not intended to replace advice given to you by your health care provider. Make sure you discuss any questions you have with your health care provider. Document Revised: 05/15/2017 Document Reviewed: 05/15/2017 Elsevier Patient Education  2020 [employer].

## 2019-12-10 ENCOUNTER — Ambulatory Visit: Payer: Medicare Other

## 2019-12-10 NOTE — Telephone Encounter (Signed)
   Primary Cardiologist: Larae Grooms, MD  Chart reviewed as part of pre-operative protocol coverage. The patient was doing well when seen by Dr. Irish Lack 12/09/19. Per note "Given possibility of surgery, will check echo. No CAD in 2019.  I think it should be ok to do back surgery from a cardiac perspective, but perioperative management will be affected by LVEF. Cc: Dr. Zada Finders".  Final clearance based on echo 12/25/19.    [place], [place] 12/10/2019, 10:01 AM

## 2019-12-13 ENCOUNTER — Other Ambulatory Visit: Payer: Self-pay

## 2019-12-13 ENCOUNTER — Ambulatory Visit (HOSPITAL_COMMUNITY): Payer: Medicare Other | Attending: Cardiology

## 2019-12-13 DIAGNOSIS — I428 Other cardiomyopathies: Secondary | ICD-10-CM

## 2019-12-18 ENCOUNTER — Other Ambulatory Visit: Payer: Self-pay | Admitting: Neurological Surgery

## 2019-12-25 ENCOUNTER — Other Ambulatory Visit (HOSPITAL_COMMUNITY): Payer: Medicare Other

## 2019-12-25 NOTE — Telephone Encounter (Signed)
   Primary Cardiologist: Larae Grooms, MD  Chart reviewed as part of pre-operative protocol coverage. Given past medical history and time since last visit, based on ACC/AHA guidelines, Crystal [name] would be at acceptable risk for the planned procedure without further cardiovascular testing.   I will route this recommendation to the requesting party via Epic fax function and remove from pre-op pool.  Please call with questions. Recent echocardiogram showed normalization of ejection fraction.  [place], [place] 12/25/2019, 2:01 PM

## 2019-12-27 ENCOUNTER — Ambulatory Visit (INDEPENDENT_AMBULATORY_CARE_PROVIDER_SITE_OTHER): Payer: Medicare Other | Admitting: *Deleted

## 2019-12-27 DIAGNOSIS — I428 Other cardiomyopathies: Secondary | ICD-10-CM

## 2019-12-27 LAB — CUP PACEART REMOTE DEVICE CHECK
Battery Remaining Longevity: 90 mo
Battery Remaining Percentage: 100 %
Brady Statistic RA Percent Paced: 0 %
Brady Statistic RV Percent Paced: 99 %
Date Time Interrogation Session: 20210604020100
HighPow Impedance: 83 Ohm
Implantable Lead Implant Date: 20191129
Implantable Lead Implant Date: 20191129
Implantable Lead Implant Date: 20191129
Implantable Lead Location: 753858
Implantable Lead Location: 753859
Implantable Lead Location: 753860
Implantable Lead Model: 293
Implantable Lead Model: 4086
Implantable Lead Serial Number: 226691
Implantable Lead Serial Number: 440868
Implantable Pulse Generator Implant Date: 20191129
Lead Channel Impedance Value: 505 Ohm
Lead Channel Impedance Value: 654 Ohm
Lead Channel Impedance Value: 870 Ohm
Lead Channel Setting Pacing Amplitude: 2 V
Lead Channel Setting Pacing Amplitude: 2.5 V
Lead Channel Setting Pacing Amplitude: 3 V
Lead Channel Setting Pacing Pulse Width: 0.4 ms
Lead Channel Setting Pacing Pulse Width: 1 ms
Lead Channel Setting Sensing Sensitivity: 0.5 mV
Lead Channel Setting Sensing Sensitivity: 1 mV
Pulse Gen Serial Number: 227627

## 2020-01-01 NOTE — Progress Notes (Signed)
Remote ICD transmission.   

## 2020-01-02 NOTE — Progress Notes (Signed)
Your procedure is scheduled on Tuesday June 15.  Report to [facility] Main Entrance "A" at 05:30 A.M., and check in at the Admitting office.  Call this number if you have problems the morning of surgery: [phone]  Call [phone] if you have any questions prior to your surgery date Monday-Friday 8am-4pm   Remember: Do not eat or drink after midnight the night before your surgery    Take these medicines the morning of surgery with A SIP OF WATER: allopurinol (ZYLOPRIM) carvedilol (COREG) DULoxetine (CYMBALTA)  febuxostat (ULORIC)  hydrALAZINE (APRESOLINE)  levothyroxine (SYNTHROID, LEVOTHROID)  loratadine (CLARITIN)   IF NEEDED: albuterol (PROVENTIL HFA;VENTOLIN HFA) ------- Please bring all inhalers with you the day of surgery.    As of today, STOP taking any Aspirin (unless otherwise instructed by your surgeon), Aleve, Naproxen, Ibuprofen, Motrin, Advil, Goody's, BC's, all herbal medications, fish oil, and all vitamins.    WHAT DO I DO ABOUT MY DIABETES MEDICATION?   . THE DAY BEFORE SURGERY: o insulin regular human CONCENTRATED (HUMULIN R U-500 KWIKPEN)  - Before meals - 120 units before breakfast and lunch - NO bedtime dose    . THE MORNING OF SURGERY o insulin regular human CONCENTRATED (HUMULIN R U-500 KWIKPEN) - NONE - If your CBG is greater than 220 mg/dL, you may take  of your sliding scale (correction) dose of insulin.    HOW TO MANAGE YOUR DIABETES BEFORE AND AFTER SURGERY  Why is it important to control my blood sugar before and after surgery? . Improving blood sugar levels before and after surgery helps healing and can limit problems. . A way of improving blood sugar control is eating a healthy diet by: o  Eating less sugar and carbohydrates o  Increasing activity/exercise o  Talking with your doctor about reaching your blood sugar goals . High blood sugars (greater than 180 mg/dL) can raise your risk of infections and slow your recovery,  so you will need to focus on controlling your diabetes during the weeks before surgery. . Make sure that the doctor who takes care of your diabetes knows about your planned surgery including the date and location.  How do I manage my blood sugar before surgery? . Check your blood sugar at least 4 times a day, starting 2 days before surgery, to make sure that the level is not too high or low. . Check your blood sugar the morning of your surgery when you wake up and every 2 hours until you get to the Short Stay unit. o If your blood sugar is less than 70 mg/dL, you will need to treat for low blood sugar: - Do not take insulin. - Treat a low blood sugar (less than 70 mg/dL) with  cup of clear juice (cranberry or apple), 4 glucose tablets, OR glucose gel. - Recheck blood sugar in 15 minutes after treatment (to make sure it is greater than 70 mg/dL). If your blood sugar is not greater than 70 mg/dL on recheck, call [phone] for further instructions. . Report your blood sugar to the short stay nurse when you get to Short Stay.  . If you are admitted to the hospital after surgery: o Your blood sugar will be checked by the staff and you will probably be given insulin after surgery (instead of oral diabetes medicines) to make sure you have good blood sugar levels. o The goal for blood sugar control after surgery is 80-180 mg/dL.     The Morning of Surgery  Do  not wear jewelry, make-up or nail polish.  Do not wear lotions, powders, or perfumes, or deodorant  Do not shave 48 hours prior to surgery.    Do not bring valuables to the hospital.  [facility] is not responsible for any belongings or valuables.  If you are a smoker, DO NOT Smoke 24 hours prior to surgery  If you wear a CPAP at night please bring your mask the morning of surgery   Remember that you must have someone to transport you home after your surgery, and remain with you for 24 hours if you are discharged the same  day.   Please bring cases for contacts, glasses, hearing aids, dentures or bridgework because it cannot be worn into surgery.    Leave your suitcase in the car.  After surgery it may be brought to your room.  For patients admitted to the hospital, discharge time will be determined by your treatment team.  Patients discharged the day of surgery will not be allowed to drive home.    Special instructions:   [facility]- Preparing For Surgery  Before surgery, you can play an important role. Because skin is not sterile, your skin needs to be as free of germs as possible. You can reduce the number of germs on your skin by washing with CHG (chlorahexidine gluconate) Soap before surgery.  CHG is an antiseptic cleaner which kills germs and bonds with the skin to continue killing germs even after washing.    Oral Hygiene is also important to reduce your risk of infection.  Remember - BRUSH YOUR TEETH THE MORNING OF SURGERY WITH YOUR REGULAR TOOTHPASTE  Please do not use if you have an allergy to CHG or antibacterial soaps. If your skin becomes reddened/irritated stop using the CHG.  Do not shave (including legs and underarms) for at least 48 hours prior to first CHG shower. It is OK to shave your face.  Please follow these instructions carefully.   1. Shower the NIGHT BEFORE SURGERY and the MORNING OF SURGERY with CHG Soap.   2. If you chose to wash your hair and body, wash as usual with your normal shampoo and body-wash/soap.  3. Rinse your hair and body thoroughly to remove the shampoo and soap.  4. Apply CHG directly to the skin (ONLY FROM THE NECK DOWN) and wash gently with a scrungie or a clean washcloth.   5. Do not use on open wounds or open sores. Avoid contact with your eyes, ears, mouth and genitals (private parts). Wash Face and genitals (private parts)  with your normal soap.   6. Wash thoroughly, paying special attention to the area where your surgery will be  performed.  7. Thoroughly rinse your body with warm water from the neck down.  8. DO NOT shower/wash with your normal soap after using and rinsing off the CHG Soap.  9. Pat yourself dry with a CLEAN TOWEL.  10. Wear CLEAN PAJAMAS to bed the night before surgery  11. Place CLEAN SHEETS on your bed the night of your first shower and DO NOT SLEEP WITH PETS.  12. Wear comfortable clothes the morning of surgery.     Day of Surgery:  Please shower the morning of surgery with the CHG soap Do not apply any deodorants/lotions. Please wear clean clothes to the hospital/surgery center.   Remember to brush your teeth WITH YOUR REGULAR TOOTHPASTE.   Please read over the following fact sheets that you were given.

## 2020-01-02 NOTE — Progress Notes (Signed)
Device orders requested for pt surgery 01/07/20. Jovita Kussmaul, RN cc'd. Joey [employer] rep) called and notified of pt procedure, time, and date.

## 2020-01-03 ENCOUNTER — Other Ambulatory Visit: Payer: Self-pay

## 2020-01-03 ENCOUNTER — Encounter (HOSPITAL_COMMUNITY)
Admission: RE | Admit: 2020-01-03 | Discharge: 2020-01-03 | Disposition: A | Discharge status: Home / Self Care | Payer: Medicare Other | Source: Ambulatory Visit | Attending: Neurological Surgery | Admitting: Neurological Surgery

## 2020-01-03 ENCOUNTER — Encounter: Payer: Self-pay | Admitting: Internal Medicine

## 2020-01-03 ENCOUNTER — Encounter (HOSPITAL_COMMUNITY): Payer: Self-pay

## 2020-01-03 ENCOUNTER — Other Ambulatory Visit (HOSPITAL_COMMUNITY)
Admission: RE | Admit: 2020-01-03 | Discharge: 2020-01-03 | Disposition: A | Discharge status: Home / Self Care | Payer: Medicare Other | Source: Ambulatory Visit | Attending: Neurological Surgery | Admitting: Neurological Surgery

## 2020-01-03 DIAGNOSIS — Z20822 Contact with and (suspected) exposure to covid-19: Secondary | ICD-10-CM | POA: Diagnosis not present

## 2020-01-03 DIAGNOSIS — D649 Anemia, unspecified: Secondary | ICD-10-CM | POA: Diagnosis not present

## 2020-01-03 DIAGNOSIS — Z01818 Encounter for other preprocedural examination: Secondary | ICD-10-CM | POA: Diagnosis not present

## 2020-01-03 DIAGNOSIS — I2510 Atherosclerotic heart disease of native coronary artery without angina pectoris: Secondary | ICD-10-CM | POA: Diagnosis not present

## 2020-01-03 DIAGNOSIS — G4733 Obstructive sleep apnea (adult) (pediatric): Secondary | ICD-10-CM | POA: Insufficient documentation

## 2020-01-03 DIAGNOSIS — Z95810 Presence of automatic (implantable) cardiac defibrillator: Secondary | ICD-10-CM | POA: Insufficient documentation

## 2020-01-03 DIAGNOSIS — E119 Type 2 diabetes mellitus without complications: Secondary | ICD-10-CM | POA: Insufficient documentation

## 2020-01-03 DIAGNOSIS — N189 Chronic kidney disease, unspecified: Secondary | ICD-10-CM | POA: Insufficient documentation

## 2020-01-03 DIAGNOSIS — R9431 Abnormal electrocardiogram [ECG] [EKG]: Secondary | ICD-10-CM | POA: Diagnosis not present

## 2020-01-03 LAB — SURGICAL PCR SCREEN
MRSA, PCR: NEGATIVE
Staphylococcus aureus: NEGATIVE

## 2020-01-03 LAB — TYPE AND SCREEN
ABO/RH(D): B POS
Antibody Screen: NEGATIVE

## 2020-01-03 LAB — CBC
HCT: 36.9 % (ref 36.0–46.0)
Hemoglobin: 11.1 g/dL — ABNORMAL LOW (ref 12.0–15.0)
MCH: 24 pg — ABNORMAL LOW (ref 26.0–34.0)
MCHC: 30.1 g/dL (ref 30.0–36.0)
MCV: 79.7 fL — ABNORMAL LOW (ref 80.0–100.0)
Platelets: 253 10*3/uL (ref 150–400)
RBC: 4.63 MIL/uL (ref 3.87–5.11)
RDW: 16.5 % — ABNORMAL HIGH (ref 11.5–15.5)
WBC: 10.3 10*3/uL (ref 4.0–10.5)
nRBC: 0 % (ref 0.0–0.2)

## 2020-01-03 LAB — BASIC METABOLIC PANEL
Anion gap: 11 (ref 5–15)
BUN: 57 mg/dL — ABNORMAL HIGH (ref 8–23)
CO2: 27 mmol/L (ref 22–32)
Calcium: 10.2 mg/dL (ref 8.9–10.3)
Chloride: 101 mmol/L (ref 98–111)
Creatinine, Ser: 1.97 mg/dL — ABNORMAL HIGH (ref 0.44–1.00)
GFR calc Af Amer: 29 mL/min — ABNORMAL LOW (ref >60)
GFR calc non Af Amer: 25 mL/min — ABNORMAL LOW (ref >60)
Glucose, Bld: 159 mg/dL — ABNORMAL HIGH (ref 70–99)
Potassium: 4.5 mmol/L (ref 3.5–5.1)
Sodium: 139 mmol/L (ref 135–145)

## 2020-01-03 LAB — SARS CORONAVIRUS 2 (TAT 6-24 HRS): SARS Coronavirus 2: NEGATIVE

## 2020-01-03 LAB — HEMOGLOBIN A1C
Hgb A1c MFr Bld: 9.1 % — ABNORMAL HIGH (ref 4.8–5.6)
Mean Plasma Glucose: 214.47 mg/dL

## 2020-01-03 LAB — GLUCOSE, CAPILLARY: Glucose-Capillary: 146 mg/dL — ABNORMAL HIGH (ref 70–99)

## 2020-01-03 NOTE — Progress Notes (Signed)
PERIOPERATIVE PRESCRIPTION FOR IMPLANTED CARDIAC DEVICE PROGRAMMING  Patient Information: Name:  Crystal [name]  DOB:  04-24-1951  MRN:  [id number]   Planned Procedure: Lumbar 4-5 and Lumbar 5-Sacral 1 open lumbar decompression and transforaminal lumbar interbody fusion  Surgeon: Emelda Brothers, MD  Date of Procedure: 01/07/20 @ 07:30a.m.  Cautery will be used.  Position during surgery:   Please send documentation back to:  [facility] (Fax # [phone])    Device Information:  Clinic EP Physician:  Virl Axe, MD  Device Type:  Defibrillator Manufacturer and Phone #:  [employer]: [phone] Pacemaker Dependent?:  No. Date of Last Device Check:  12/27/2019 Normal Device Function?:  Yes.    Electrophysiologist's Recommendations:   Have magnet available.  Provide continuous ECG monitoring when magnet is used or reprogramming is to be performed.   Procedure should not interfere with device function.  No device programming or magnet placement needed.  Per Device Clinic [address] [place], [place]  12:09 PM 01/03/2020

## 2020-01-03 NOTE — Progress Notes (Addendum)
PCP:  Crist Infante, MD Cardiologist:  Larae Grooms, MD  EKG:  01/03/20 CXR:  N/A ECHO:  12/13/19 Stress Test: 2018 in Care Everywhere Cardiac Cath:  06/01/18  Fasting Blood Sugar-  120-230 Checks Blood Sugar__3_ times a day  Covid test 01/03/20  Anesthesia Review:  Yes, cardiac history.  Crystal [name].  Physicians orders sent. Pt wears cpap and 2L oxygen at night.  No oxygen during the day. A1C 9.1  Patient denies shortness of breath, fever, cough, and chest pain at PAT appointment.  Patient verbalized understanding of instructions provided today at the PAT appointment.  Patient asked to review instructions at home and day of surgery.

## 2020-01-06 NOTE — Anesthesia Preprocedure Evaluation (Addendum)
Anesthesia Evaluation  Patient identified by MRN, date of birth, ID band Patient awake    Reviewed: Allergy & Precautions, NPO status , Patient's Chart, lab work & pertinent test results, reviewed documented beta blocker date and time   History of Anesthesia Complications (+) PONVNegative for: history of anesthetic complications  Airway Mallampati: II  TM Distance: >3 FB Neck ROM: Full    Dental  (+) Edentulous Lower, Edentulous Upper   Pulmonary asthma , sleep apnea, Continuous Positive Airway Pressure Ventilation and Oxygen sleep apnea ,    Pulmonary exam normal        Cardiovascular hypertension, Pt. on home beta blockers and Pt. on medications +CHF (h/o NICM, EF recovered)  Normal cardiovascular exam+ pacemaker + Cardiac Defibrillator   2019 cath- no significant CAD   Neuro/Psych negative neurological ROS  negative psych ROS   GI/Hepatic Neg liver ROS, GERD  ,  Endo/Other  diabetes (A1c 9.1), Poorly Controlled, Insulin DependentHypothyroidism Morbid obesity  Renal/GU Renal InsufficiencyRenal disease (CKD 3/4, Cr 1.97)  negative genitourinary   Musculoskeletal  (+) Arthritis ,   Abdominal   Peds  Hematology  (+) Blood dyscrasia, anemia ,   Anesthesia Other Findings  Last seen by Dr. Irish Lack (Cardiology) 12/09/2019 for preop clearance.  Per note, "Given possibility of surgery, will check echo. No CAD in 2019.  I think it should be ok to do back surgery from a cardiac perspective, but perioperative management will be affected by LVEF."  Echo 12/13/2019 showed normalized EF 55 to 60, grade 1 diastolic dysfunction, no significant valvular abnormalities.  Dr. Irish Lack commented on result stating, "Ejection fraction has returned to normal.  Normal valvular function.  Okay to proceed with planned surgery."  Reproductive/Obstetrics                          Anesthesia Physical Anesthesia Plan  ASA:  III  Anesthesia Plan: General   Post-op Pain Management:    Induction: Intravenous  PONV Risk Score and Plan: 4 or greater and Ondansetron, Dexamethasone, Treatment may vary due to age or medical condition and Midazolam  Airway Management Planned: Oral ETT  Additional Equipment: None  Intra-op Plan:   Post-operative Plan: Extubation in OR  Informed Consent: I have reviewed the patients History and Physical, chart, labs and discussed the procedure including the risks, benefits and alternatives for the proposed anesthesia with the patient or authorized representative who has indicated his/her understanding and acceptance.     Dental advisory given  Plan Discussed with:   Anesthesia Plan Comments: (PAT note by Karoline Caldwell, PA-C: Follows with cardiology for history of nonischemic cardiomyopathy (EF now normalized), left bundle branch block with biventricular ICD in place, nonobstructive CAD. Last seen by Dr. Irish Lack 12/09/2019 for preop clearance.  Per note, "Given possibility of surgery, will check echo. No CAD in 2019.  I think it should be ok to do back surgery from a cardiac perspective, but perioperative management will be affected by LVEF."  Echo 12/13/2019 showed normalized EF 55 to 60, grade 1 diastolic dysfunction, no significant valvular abnormalities.  Dr. Irish Lack commented on result stating, "Ejection fraction has returned to normal. Normal valvular function. 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  Creatinine on preop labs 1.97 which appears to be near her baseline per review of previous labs.  Mild anemia on preop labs, hemoglobin 11.1.  Uncontrolled IDDM2, A1c 9.1 on preop labs (stable compared to 5 months ago).  OSA on CPAP and 2 L O2 at night, no supplemental oxygen during the day.  Periop device orders per progress note 01/03/2020: Device Information:  Clinic EP Physician:  Virl Axe, MD  Device Type:   Defibrillator Manufacturer and Phone #:  [employer]: [phone] Pacemaker Dependent?:  No. Date of Last Device Check:  12/27/2019         Normal Device Function?:  Yes.    Electrophysiologist's Recommendations:  Have magnet available. Provide continuous ECG monitoring when magnet is used or reprogramming is to be performed.  Procedure should not interfere with device function.  No device programming or magnet placement needed.   EKG 01/03/2020: Atrial-sensed ventricular-paced rhythm.  Rate 81.  TTE 12/13/2019: 1. Normal LV systolic function; grade 1 diastolic dysfunction; moderate  LVH; mild RVE.  2. Left ventricular ejection fraction, by estimation, is 55 to 60%. The  left ventricle has normal function. The left ventricle has no regional  wall motion abnormalities. There is moderate left ventricular hypertrophy.  Left ventricular diastolic  parameters are consistent with Grade I diastolic dysfunction (impaired  relaxation).  3. Right ventricular systolic function is normal. The right ventricular  size is mildly enlarged. There is normal pulmonary artery systolic  pressure.  4. The mitral valve is normal in structure. Trivial mitral valve  regurgitation. No evidence of mitral stenosis.  5. The aortic valve is tricuspid. Aortic valve regurgitation is not  visualized. Mild aortic valve sclerosis is present, with no evidence of  aortic valve stenosis.  6. The inferior vena cava is normal in size with greater than 50%  respiratory variability, suggesting right atrial pressure of 3 mmHg.   Right/left heart cath 06/01/2018: Prox LAD lesion is 25% stenosed. Ost 1st Diag lesion is 25% stenosed. Mid LM lesion is 10% stenosed. LV end diastolic pressure is mildly elevated. LVEDP 17 mm Hg. There is no aortic valve stenosis. Hemodynamic findings consistent with mild pulmonary hypertension. Ao sat 98%, PA sat 66%, CO 4.79 L/min; CI 2.2, mean PA 30 mm Hg; mean PCWP 22 mm Hg    No significant CAD.   Medical therapy for LV dysfunction. )       Anesthesia Quick Evaluation

## 2020-01-06 NOTE — Progress Notes (Signed)
Anesthesia Chart Review:   Follows with cardiology for history of nonischemic cardiomyopathy (EF now normalized), left bundle branch block with biventricular ICD in place, nonobstructive CAD. Last seen by Dr. Irish Lack 12/09/2019 for preop clearance.  Per note, "Given possibility of surgery, will check echo. No CAD in 2019.  I think it should be ok to do back surgery from a cardiac perspective, but perioperative management will be affected by LVEF."  Echo 12/13/2019 showed normalized EF 55 to 60, grade 1 diastolic dysfunction, no significant valvular abnormalities.  Dr. Irish Lack commented on result stating, "Ejection fraction has returned to normal. Normal valvular function. 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  Creatinine on preop labs 1.97 which appears to be near her baseline per review of previous labs.  Mild anemia on preop labs, hemoglobin 11.1.  Uncontrolled IDDM2, A1c 9.1 on preop labs (stable compared to 5 months ago).  OSA on CPAP and 2 L O2 at night, no supplemental oxygen during the day.  Periop device orders per progress note 01/03/2020: Device Information:  Clinic EP Physician:  Virl Axe, MD  Device Type:  Defibrillator Manufacturer and Phone #:  [employer]: [phone] Pacemaker Dependent?:  No. Date of Last Device Check:  12/27/2019         Normal Device Function?:  Yes.    Electrophysiologist's Recommendations:   Have magnet available.  Provide continuous ECG monitoring when magnet is used or reprogramming is to be performed.   Procedure should not interfere with device function.  No device programming or magnet placement needed.   EKG 01/03/2020: Atrial-sensed ventricular-paced rhythm.  Rate 81.  TTE 12/13/2019: 1. Normal LV systolic function; grade 1 diastolic dysfunction; moderate  LVH; mild RVE.  2. Left ventricular ejection fraction, by estimation, is 55 to 60%. The  left ventricle has normal function. The  left ventricle has no regional  wall motion abnormalities. There is moderate left ventricular hypertrophy.  Left ventricular diastolic  parameters are consistent with Grade I diastolic dysfunction (impaired  relaxation).  3. Right ventricular systolic function is normal. The right ventricular  size is mildly enlarged. There is normal pulmonary artery systolic  pressure.  4. The mitral valve is normal in structure. Trivial mitral valve  regurgitation. No evidence of mitral stenosis.  5. The aortic valve is tricuspid. Aortic valve regurgitation is not  visualized. Mild aortic valve sclerosis is present, with no evidence of  aortic valve stenosis.  6. The inferior vena cava is normal in size with greater than 50%  respiratory variability, suggesting right atrial pressure of 3 mmHg.   Right/left heart cath 06/01/2018:  Prox LAD lesion is 25% stenosed.  Ost 1st Diag lesion is 25% stenosed.  Mid LM lesion is 10% stenosed.  LV end diastolic pressure is mildly elevated. LVEDP 17 mm Hg.  There is no aortic valve stenosis.  Hemodynamic findings consistent with mild pulmonary hypertension.  Ao sat 98%, PA sat 66%, CO 4.79 L/min; CI 2.2, mean PA 30 mm Hg; mean PCWP 22 mm Hg   No significant CAD.   Medical therapy for LV dysfunction.    Wynonia Musty [facility] Short Stay Center/Anesthesiology Phone [phone] 01/06/2020 10:37 AM

## 2020-01-07 ENCOUNTER — Other Ambulatory Visit: Payer: Self-pay

## 2020-01-07 ENCOUNTER — Observation Stay (HOSPITAL_COMMUNITY)
Admission: RE | Admit: 2020-01-07 | Discharge: 2020-01-08 | Disposition: A | Discharge status: Home / Self Care | Payer: Medicare Other | Attending: Neurological Surgery | Admitting: Neurological Surgery

## 2020-01-07 ENCOUNTER — Inpatient Hospital Stay (HOSPITAL_COMMUNITY): Payer: Medicare Other | Admitting: Certified Registered Nurse Anesthetist

## 2020-01-07 ENCOUNTER — Encounter (HOSPITAL_COMMUNITY): Payer: Self-pay | Admitting: Neurological Surgery

## 2020-01-07 ENCOUNTER — Encounter (HOSPITAL_COMMUNITY)
Admission: RE | Disposition: A | Discharge status: Home / Self Care | Payer: Self-pay | Source: Home / Self Care | Attending: Neurological Surgery

## 2020-01-07 ENCOUNTER — Inpatient Hospital Stay (HOSPITAL_COMMUNITY): Payer: Medicare Other

## 2020-01-07 ENCOUNTER — Inpatient Hospital Stay (HOSPITAL_COMMUNITY): Payer: Medicare Other | Admitting: Physician Assistant

## 2020-01-07 DIAGNOSIS — I130 Hypertensive heart and chronic kidney disease with heart failure and stage 1 through stage 4 chronic kidney disease, or unspecified chronic kidney disease: Secondary | ICD-10-CM | POA: Diagnosis not present

## 2020-01-07 DIAGNOSIS — M48062 Spinal stenosis, lumbar region with neurogenic claudication: Secondary | ICD-10-CM | POA: Diagnosis not present

## 2020-01-07 DIAGNOSIS — Z9981 Dependence on supplemental oxygen: Secondary | ICD-10-CM | POA: Insufficient documentation

## 2020-01-07 DIAGNOSIS — N183 Chronic kidney disease, stage 3 unspecified: Secondary | ICD-10-CM | POA: Diagnosis not present

## 2020-01-07 DIAGNOSIS — G4733 Obstructive sleep apnea (adult) (pediatric): Secondary | ICD-10-CM | POA: Insufficient documentation

## 2020-01-07 DIAGNOSIS — M4316 Spondylolisthesis, lumbar region: Secondary | ICD-10-CM | POA: Insufficient documentation

## 2020-01-07 DIAGNOSIS — I2510 Atherosclerotic heart disease of native coronary artery without angina pectoris: Secondary | ICD-10-CM | POA: Insufficient documentation

## 2020-01-07 DIAGNOSIS — I252 Old myocardial infarction: Secondary | ICD-10-CM | POA: Insufficient documentation

## 2020-01-07 DIAGNOSIS — I5032 Chronic diastolic (congestive) heart failure: Secondary | ICD-10-CM | POA: Insufficient documentation

## 2020-01-07 DIAGNOSIS — Z6839 Body mass index (BMI) 39.0-39.9, adult: Secondary | ICD-10-CM | POA: Diagnosis not present

## 2020-01-07 DIAGNOSIS — Z95810 Presence of automatic (implantable) cardiac defibrillator: Secondary | ICD-10-CM | POA: Insufficient documentation

## 2020-01-07 DIAGNOSIS — Z794 Long term (current) use of insulin: Secondary | ICD-10-CM | POA: Insufficient documentation

## 2020-01-07 DIAGNOSIS — E1142 Type 2 diabetes mellitus with diabetic polyneuropathy: Secondary | ICD-10-CM | POA: Insufficient documentation

## 2020-01-07 DIAGNOSIS — Z419 Encounter for procedure for purposes other than remedying health state, unspecified: Secondary | ICD-10-CM

## 2020-01-07 HISTORY — PX: TRANSFORAMINAL LUMBAR INTERBODY FUSION (TLIF) WITH PEDICLE SCREW FIXATION 2 LEVEL: SHX6142

## 2020-01-07 LAB — GLUCOSE, CAPILLARY
Glucose-Capillary: 212 mg/dL — ABNORMAL HIGH (ref 70–99)
Glucose-Capillary: 303 mg/dL — ABNORMAL HIGH (ref 70–99)
Glucose-Capillary: 302 mg/dL — ABNORMAL HIGH (ref 70–99)
Glucose-Capillary: 269 mg/dL — ABNORMAL HIGH (ref 70–99)
Glucose-Capillary: 239 mg/dL — ABNORMAL HIGH (ref 70–99)
Glucose-Capillary: 221 mg/dL — ABNORMAL HIGH (ref 70–99)
Glucose-Capillary: 247 mg/dL — ABNORMAL HIGH (ref 70–99)
Glucose-Capillary: 207 mg/dL — ABNORMAL HIGH (ref 70–99)

## 2020-01-07 LAB — POCT I-STAT, CHEM 8
BUN: 38 mg/dL — ABNORMAL HIGH (ref 8–23)
Calcium, Ion: 1.15 mmol/L (ref 1.15–1.40)
Chloride: 103 mmol/L (ref 98–111)
Creatinine, Ser: 1.6 mg/dL — ABNORMAL HIGH (ref 0.44–1.00)
Glucose, Bld: 313 mg/dL — ABNORMAL HIGH (ref 70–99)
HCT: 25 % — ABNORMAL LOW (ref 36.0–46.0)
Hemoglobin: 8.5 g/dL — ABNORMAL LOW (ref 12.0–15.0)
Potassium: 5.8 mmol/L — ABNORMAL HIGH (ref 3.5–5.1)
Sodium: 136 mmol/L (ref 135–145)
TCO2: 27 mmol/L (ref 22–32)

## 2020-01-07 SURGERY — TRANSFORAMINAL LUMBAR INTERBODY FUSION (TLIF) WITH PEDICLE SCREW FIXATION 2 LEVEL
Anesthesia: General

## 2020-01-07 MED ORDER — MIDAZOLAM HCL 5 MG/5ML IJ SOLN
INTRAMUSCULAR | Status: DC | PRN
  Administered 2020-01-07: 2 mg via INTRAVENOUS

## 2020-01-07 MED ORDER — SODIUM CHLORIDE 0.9% FLUSH
3.0000 mL | INTRAVENOUS | Status: DC | PRN
  Administered 2020-01-08: 3 mL via INTRAVENOUS

## 2020-01-07 MED ORDER — LACTATED RINGERS IV SOLN: INTRAVENOUS | Status: DC | PRN

## 2020-01-07 MED ORDER — GABAPENTIN 300 MG PO CAPS
300.0000 mg | ORAL_CAPSULE | Freq: Every day | ORAL | Status: DC
  Administered 2020-01-07: 300 mg via ORAL
  Filled 2020-01-07: qty 1

## 2020-01-07 MED ORDER — CHLORHEXIDINE GLUCONATE CLOTH 2 % EX PADS: 6.0000 | MEDICATED_PAD | Freq: Once | CUTANEOUS | Status: DC

## 2020-01-07 MED ORDER — DOCUSATE SODIUM 100 MG PO CAPS
100.0000 mg | ORAL_CAPSULE | Freq: Two times a day (BID) | ORAL | Status: DC
  Administered 2020-01-07 – 2020-01-08 (×2): 100 mg via ORAL
  Filled 2020-01-07 (×2): qty 1

## 2020-01-07 MED ORDER — ROSUVASTATIN CALCIUM 5 MG PO TABS
10.0000 mg | ORAL_TABLET | Freq: Every day | ORAL | Status: DC
  Administered 2020-01-07: 10 mg via ORAL
  Filled 2020-01-07: qty 2

## 2020-01-07 MED ORDER — INSULIN REGULAR HUMAN (CONC) 500 UNIT/ML ~~LOC~~ SOPN: 120.0000 [IU] | PEN_INJECTOR | SUBCUTANEOUS | Status: DC

## 2020-01-07 MED ORDER — FEBUXOSTAT 40 MG PO TABS: 40.0000 mg | ORAL_TABLET | Freq: Every day | ORAL | Status: DC

## 2020-01-07 MED ORDER — FENTANYL CITRATE (PF) 250 MCG/5ML IJ SOLN
INTRAMUSCULAR | Status: AC
  Filled 2020-01-07: qty 5

## 2020-01-07 MED ORDER — PHENOL 1.4 % MT LIQD: 1.0000 | OROMUCOSAL | Status: DC | PRN

## 2020-01-07 MED ORDER — MONTELUKAST SODIUM 10 MG PO TABS
10.0000 mg | ORAL_TABLET | Freq: Every day | ORAL | Status: DC
  Administered 2020-01-07: 10 mg via ORAL
  Filled 2020-01-07: qty 1

## 2020-01-07 MED ORDER — ALBUTEROL SULFATE (2.5 MG/3ML) 0.083% IN NEBU: 2.5000 mg | INHALATION_SOLUTION | Freq: Four times a day (QID) | RESPIRATORY_TRACT | Status: DC | PRN

## 2020-01-07 MED ORDER — LEVOTHYROXINE SODIUM 88 MCG PO TABS
88.0000 ug | ORAL_TABLET | Freq: Every day | ORAL | Status: DC
  Administered 2020-01-08: 88 ug via ORAL
  Filled 2020-01-07: qty 1

## 2020-01-07 MED ORDER — PHENYLEPHRINE HCL (PRESSORS) 10 MG/ML IV SOLN
INTRAVENOUS | Status: DC | PRN
  Administered 2020-01-07 (×4): 80 ug via INTRAVENOUS

## 2020-01-07 MED ORDER — CHLORHEXIDINE GLUCONATE 0.12 % MT SOLN: 15.0000 mL | Freq: Once | OROMUCOSAL | Status: AC

## 2020-01-07 MED ORDER — LIDOCAINE-EPINEPHRINE 1 %-1:100000 IJ SOLN
INTRAMUSCULAR | Status: DC | PRN
  Administered 2020-01-07: 5 mL

## 2020-01-07 MED ORDER — DEXMEDETOMIDINE HCL 200 MCG/2ML IV SOLN
INTRAVENOUS | Status: DC | PRN
  Administered 2020-01-07 (×7): 8 ug via INTRAVENOUS

## 2020-01-07 MED ORDER — SODIUM CHLORIDE 0.9 % IV SOLN
INTRAVENOUS | Status: DC | PRN
  Administered 2020-01-07: 500 mL

## 2020-01-07 MED ORDER — OXYCODONE HCL 5 MG PO TABS: 5.0000 mg | ORAL_TABLET | Freq: Once | ORAL | Status: DC | PRN

## 2020-01-07 MED ORDER — INSULIN REGULAR(HUMAN) IN NACL 100-0.9 UT/100ML-% IV SOLN
INTRAVENOUS | Status: DC | PRN
  Administered 2020-01-07: 17 [IU]/h via INTRAVENOUS

## 2020-01-07 MED ORDER — TEMAZEPAM 15 MG PO CAPS: 15.0000 mg | ORAL_CAPSULE | Freq: Every evening | ORAL | Status: DC | PRN

## 2020-01-07 MED ORDER — ONDANSETRON HCL 4 MG/2ML IJ SOLN
INTRAMUSCULAR | Status: DC | PRN
  Administered 2020-01-07 (×2): 4 mg via INTRAVENOUS

## 2020-01-07 MED ORDER — ALBUTEROL SULFATE HFA 108 (90 BASE) MCG/ACT IN AERS
2.0000 | INHALATION_SPRAY | Freq: Four times a day (QID) | RESPIRATORY_TRACT | Status: DC | PRN
  Filled 2020-01-07: qty 6.7

## 2020-01-07 MED ORDER — ONDANSETRON HCL 4 MG/2ML IJ SOLN: 4.0000 mg | Freq: Four times a day (QID) | INTRAMUSCULAR | Status: DC | PRN

## 2020-01-07 MED ORDER — ROCURONIUM BROMIDE 100 MG/10ML IV SOLN
INTRAVENOUS | Status: DC | PRN
  Administered 2020-01-07: 100 mg via INTRAVENOUS
  Administered 2020-01-07: 50 mg via INTRAVENOUS
  Administered 2020-01-07: 20 mg via INTRAVENOUS

## 2020-01-07 MED ORDER — SODIUM CHLORIDE 0.9% FLUSH
3.0000 mL | Freq: Two times a day (BID) | INTRAVENOUS | Status: DC
  Administered 2020-01-07 – 2020-01-08 (×2): 3 mL via INTRAVENOUS

## 2020-01-07 MED ORDER — CHLORHEXIDINE GLUCONATE 0.12 % MT SOLN
OROMUCOSAL | Status: AC
  Administered 2020-01-07: 15 mL via OROMUCOSAL
  Filled 2020-01-07: qty 15

## 2020-01-07 MED ORDER — BUPIVACAINE HCL (PF) 0.5 % IJ SOLN
INTRAMUSCULAR | Status: DC | PRN
  Administered 2020-01-07: 5 mL

## 2020-01-07 MED ORDER — ROCURONIUM BROMIDE 10 MG/ML (PF) SYRINGE
PREFILLED_SYRINGE | INTRAVENOUS | Status: AC
  Filled 2020-01-07: qty 20

## 2020-01-07 MED ORDER — TORSEMIDE 20 MG PO TABS
20.0000 mg | ORAL_TABLET | Freq: Every day | ORAL | Status: DC
  Administered 2020-01-08: 20 mg via ORAL
  Filled 2020-01-07: qty 1

## 2020-01-07 MED ORDER — ALLOPURINOL 100 MG PO TABS
100.0000 mg | ORAL_TABLET | Freq: Every day | ORAL | Status: DC
  Administered 2020-01-08: 100 mg via ORAL
  Filled 2020-01-07: qty 1

## 2020-01-07 MED ORDER — ACETAMINOPHEN 325 MG PO TABS
650.0000 mg | ORAL_TABLET | ORAL | Status: DC | PRN
  Administered 2020-01-08: 650 mg via ORAL
  Filled 2020-01-07: qty 2

## 2020-01-07 MED ORDER — 0.9 % SODIUM CHLORIDE (POUR BTL) OPTIME
TOPICAL | Status: DC | PRN
  Administered 2020-01-07: 1000 mL

## 2020-01-07 MED ORDER — DULOXETINE HCL 60 MG PO CPEP
60.0000 mg | ORAL_CAPSULE | Freq: Every day | ORAL | Status: DC
  Administered 2020-01-08: 60 mg via ORAL
  Filled 2020-01-07: qty 1

## 2020-01-07 MED ORDER — SUGAMMADEX SODIUM 200 MG/2ML IV SOLN
INTRAVENOUS | Status: DC | PRN
  Administered 2020-01-07: 400 mg via INTRAVENOUS

## 2020-01-07 MED ORDER — PANTOPRAZOLE SODIUM 40 MG PO TBEC
40.0000 mg | DELAYED_RELEASE_TABLET | Freq: Every day | ORAL | Status: DC
  Administered 2020-01-07: 40 mg via ORAL
  Filled 2020-01-07: qty 1

## 2020-01-07 MED ORDER — DEXTROSE 50 % IV SOLN: 0.0000 mL | INTRAVENOUS | Status: DC | PRN

## 2020-01-07 MED ORDER — PHENYLEPHRINE HCL (PRESSORS) 10 MG/ML IV SOLN
INTRAVENOUS | Status: AC
  Filled 2020-01-07: qty 1

## 2020-01-07 MED ORDER — ONDANSETRON HCL 4 MG/2ML IJ SOLN: 4.0000 mg | Freq: Once | INTRAMUSCULAR | Status: DC | PRN

## 2020-01-07 MED ORDER — HYDROMORPHONE HCL 1 MG/ML IJ SOLN: 0.2500 mg | INTRAMUSCULAR | Status: DC | PRN

## 2020-01-07 MED ORDER — HYDRALAZINE HCL 25 MG PO TABS
25.0000 mg | ORAL_TABLET | Freq: Three times a day (TID) | ORAL | Status: DC
  Administered 2020-01-07 – 2020-01-08 (×2): 25 mg via ORAL
  Filled 2020-01-07 (×2): qty 1

## 2020-01-07 MED ORDER — LORATADINE 10 MG PO TABS
10.0000 mg | ORAL_TABLET | Freq: Every day | ORAL | Status: DC
  Administered 2020-01-08: 10 mg via ORAL
  Filled 2020-01-07: qty 1

## 2020-01-07 MED ORDER — ACETAMINOPHEN 650 MG RE SUPP: 650.0000 mg | RECTAL | Status: DC | PRN

## 2020-01-07 MED ORDER — CEFAZOLIN SODIUM-DEXTROSE 2-4 GM/100ML-% IV SOLN
2.0000 g | INTRAVENOUS | Status: AC
  Administered 2020-01-07: 2 g via INTRAVENOUS
  Filled 2020-01-07: qty 100

## 2020-01-07 MED ORDER — DEXAMETHASONE SODIUM PHOSPHATE 10 MG/ML IJ SOLN
INTRAMUSCULAR | Status: DC | PRN
  Administered 2020-01-07: 5 mg via INTRAVENOUS

## 2020-01-07 MED ORDER — OXYCODONE HCL 5 MG PO TABS: 10.0000 mg | ORAL_TABLET | ORAL | Status: DC | PRN

## 2020-01-07 MED ORDER — THROMBIN 5000 UNITS EX SOLR
CUTANEOUS | Status: AC
  Filled 2020-01-07: qty 5000

## 2020-01-07 MED ORDER — CARVEDILOL 25 MG PO TABS
25.0000 mg | ORAL_TABLET | Freq: Two times a day (BID) | ORAL | Status: DC
  Administered 2020-01-07 – 2020-01-08 (×2): 25 mg via ORAL
  Filled 2020-01-07 (×2): qty 1

## 2020-01-07 MED ORDER — INSULIN REGULAR(HUMAN) IN NACL 100-0.9 UT/100ML-% IV SOLN: INTRAVENOUS | Status: DC

## 2020-01-07 MED ORDER — INSULIN REGULAR(HUMAN) IN NACL 100-0.9 UT/100ML-% IV SOLN
INTRAVENOUS | Status: DC
  Administered 2020-01-07: 20 [IU]/h via INTRAVENOUS

## 2020-01-07 MED ORDER — ONDANSETRON HCL 4 MG/2ML IJ SOLN
INTRAMUSCULAR | Status: AC
  Filled 2020-01-07: qty 2

## 2020-01-07 MED ORDER — PROPOFOL 10 MG/ML IV BOLUS
INTRAVENOUS | Status: AC
  Filled 2020-01-07: qty 20

## 2020-01-07 MED ORDER — NITROGLYCERIN IN D5W 200-5 MCG/ML-% IV SOLN
INTRAVENOUS | Status: AC
  Filled 2020-01-07: qty 250

## 2020-01-07 MED ORDER — OXYCODONE HCL 5 MG/5ML PO SOLN: 5.0000 mg | Freq: Once | ORAL | Status: DC | PRN

## 2020-01-07 MED ORDER — ORAL CARE MOUTH RINSE: 15.0000 mL | Freq: Once | OROMUCOSAL | Status: AC

## 2020-01-07 MED ORDER — INSULIN ASPART 100 UNIT/ML ~~LOC~~ SOLN: 0.0000 [IU] | SUBCUTANEOUS | Status: DC

## 2020-01-07 MED ORDER — FENTANYL CITRATE (PF) 100 MCG/2ML IJ SOLN
INTRAMUSCULAR | Status: DC | PRN
  Administered 2020-01-07 (×5): 50 ug via INTRAVENOUS

## 2020-01-07 MED ORDER — HYDROMORPHONE HCL 1 MG/ML IJ SOLN: 1.0000 mg | INTRAMUSCULAR | Status: DC | PRN

## 2020-01-07 MED ORDER — KETAMINE HCL 10 MG/ML IJ SOLN
INTRAMUSCULAR | Status: DC | PRN
  Administered 2020-01-07 (×2): 25 mg via INTRAVENOUS

## 2020-01-07 MED ORDER — KETAMINE HCL 50 MG/5ML IJ SOSY
PREFILLED_SYRINGE | INTRAMUSCULAR | Status: AC
  Filled 2020-01-07: qty 5

## 2020-01-07 MED ORDER — MENTHOL 3 MG MT LOZG: 1.0000 | LOZENGE | OROMUCOSAL | Status: DC | PRN

## 2020-01-07 MED ORDER — THROMBIN 5000 UNITS EX SOLR
OROMUCOSAL | Status: DC | PRN
  Administered 2020-01-07: 5 mL via TOPICAL

## 2020-01-07 MED ORDER — DEXAMETHASONE SODIUM PHOSPHATE 10 MG/ML IJ SOLN
INTRAMUSCULAR | Status: AC
  Filled 2020-01-07: qty 1

## 2020-01-07 MED ORDER — INSULIN ASPART 100 UNIT/ML ~~LOC~~ SOLN
0.0000 [IU] | SUBCUTANEOUS | Status: DC
  Administered 2020-01-07: 5 [IU] via SUBCUTANEOUS
  Administered 2020-01-08: 3 [IU] via SUBCUTANEOUS
  Administered 2020-01-08: 11 [IU] via SUBCUTANEOUS
  Administered 2020-01-08 (×2): 3 [IU] via SUBCUTANEOUS

## 2020-01-07 MED ORDER — CYCLOBENZAPRINE HCL 10 MG PO TABS: 10.0000 mg | ORAL_TABLET | Freq: Three times a day (TID) | ORAL | Status: DC | PRN

## 2020-01-07 MED ORDER — LIDOCAINE HCL (CARDIAC) PF 100 MG/5ML IV SOSY
PREFILLED_SYRINGE | INTRAVENOUS | Status: DC | PRN
  Administered 2020-01-07: 60 mg via INTRAVENOUS

## 2020-01-07 MED ORDER — PHENYLEPHRINE 40 MCG/ML (10ML) SYRINGE FOR IV PUSH (FOR BLOOD PRESSURE SUPPORT)
PREFILLED_SYRINGE | INTRAVENOUS | Status: AC
  Filled 2020-01-07: qty 10

## 2020-01-07 MED ORDER — ALBUMIN HUMAN 5 % IV SOLN
INTRAVENOUS | Status: DC | PRN
Start: 2020-01-07 — End: 2020-01-07

## 2020-01-07 MED ORDER — ONDANSETRON HCL 4 MG PO TABS: 4.0000 mg | ORAL_TABLET | Freq: Four times a day (QID) | ORAL | Status: DC | PRN

## 2020-01-07 MED ORDER — LIDOCAINE 2% (20 MG/ML) 5 ML SYRINGE
INTRAMUSCULAR | Status: AC
  Filled 2020-01-07: qty 5

## 2020-01-07 MED ORDER — MIDAZOLAM HCL 2 MG/2ML IJ SOLN
INTRAMUSCULAR | Status: AC
  Filled 2020-01-07: qty 2

## 2020-01-07 MED ORDER — CEFAZOLIN SODIUM-DEXTROSE 2-4 GM/100ML-% IV SOLN
2.0000 g | Freq: Three times a day (TID) | INTRAVENOUS | Status: AC
  Administered 2020-01-07 – 2020-01-08 (×2): 2 g via INTRAVENOUS
  Filled 2020-01-07 (×2): qty 100

## 2020-01-07 MED ORDER — PROPOFOL 10 MG/ML IV BOLUS
INTRAVENOUS | Status: DC | PRN
  Administered 2020-01-07 (×3): 20 mg via INTRAVENOUS
  Administered 2020-01-07: 120 mg via INTRAVENOUS
  Administered 2020-01-07: 20 mg via INTRAVENOUS

## 2020-01-07 MED ORDER — PHENYLEPHRINE HCL-NACL 10-0.9 MG/250ML-% IV SOLN
INTRAVENOUS | Status: DC | PRN
  Administered 2020-01-07: 40 ug/min via INTRAVENOUS
  Administered 2020-01-07: 60 ug/min via INTRAVENOUS

## 2020-01-07 MED ORDER — SODIUM CHLORIDE 0.9 % IV SOLN: 250.0000 mL | INTRAVENOUS | Status: DC

## 2020-01-07 MED ORDER — HYDROMORPHONE HCL 1 MG/ML IJ SOLN
INTRAMUSCULAR | Status: AC
  Filled 2020-01-07: qty 1

## 2020-01-07 MED ORDER — INSULIN REGULAR(HUMAN) IN NACL 100-0.9 UT/100ML-% IV SOLN
INTRAVENOUS | Status: AC
  Administered 2020-01-07: 24 [IU]/h via INTRAVENOUS
  Filled 2020-01-07 (×2): qty 100

## 2020-01-07 MED ORDER — LIDOCAINE-EPINEPHRINE 1 %-1:100000 IJ SOLN
INTRAMUSCULAR | Status: AC
  Filled 2020-01-07: qty 1

## 2020-01-07 MED ORDER — POLYETHYLENE GLYCOL 3350 17 G PO PACK: 17.0000 g | PACK | Freq: Every day | ORAL | Status: DC | PRN

## 2020-01-07 MED ORDER — INSULIN REGULAR HUMAN (CONC) 500 UNIT/ML ~~LOC~~ SOPN
65.0000 [IU] | PEN_INJECTOR | Freq: Two times a day (BID) | SUBCUTANEOUS | Status: DC
  Administered 2020-01-07 – 2020-01-08 (×2): 65 [IU] via SUBCUTANEOUS
  Filled 2020-01-07: qty 3

## 2020-01-07 MED ORDER — BUPIVACAINE HCL (PF) 0.5 % IJ SOLN
INTRAMUSCULAR | Status: AC
  Filled 2020-01-07: qty 30

## 2020-01-07 MED ORDER — OXYCODONE HCL 5 MG PO TABS
5.0000 mg | ORAL_TABLET | ORAL | Status: DC | PRN
  Administered 2020-01-07: 5 mg via ORAL
  Filled 2020-01-07: qty 1

## 2020-01-07 SURGICAL SUPPLY — 74 items
BAG DECANTER FOR FLEXI CONT (MISCELLANEOUS) ×3 IMPLANT
BAND RUBBER #18 3X1/16 STRL (MISCELLANEOUS) IMPLANT
BASKET BONE COLLECTION (BASKET) ×3 IMPLANT
BENZOIN TINCTURE PRP APPL 2/3 (GAUZE/BANDAGES/DRESSINGS) IMPLANT
BLADE CLIPPER SURG (BLADE) IMPLANT
BLADE SURG 11 STRL SS (BLADE) ×3 IMPLANT
BUR MATCHSTICK NEURO 3.0 LAGG (BURR) ×3 IMPLANT
BUR PRECISION FLUTE 5.0 (BURR) ×3 IMPLANT
CANISTER SUCT 3000ML PPV (MISCELLANEOUS) ×3 IMPLANT
CLOSURE WOUND 1/2 X4 (GAUZE/BANDAGES/DRESSINGS)
CNTNR URN SCR LID CUP LEK RST (MISCELLANEOUS) ×1 IMPLANT
CONT SPEC 4OZ STRL OR WHT (MISCELLANEOUS) ×2
COVER BACK TABLE 60X90IN (DRAPES) ×3 IMPLANT
COVER WAND RF STERILE (DRAPES) IMPLANT
DECANTER SPIKE VIAL GLASS SM (MISCELLANEOUS) ×3 IMPLANT
DERMABOND ADVANCED (GAUZE/BANDAGES/DRESSINGS) ×2
DERMABOND ADVANCED .7 DNX12 (GAUZE/BANDAGES/DRESSINGS) ×1 IMPLANT
DEVICE INTERBODY ELEVATE 23X8 (Cage) ×4 IMPLANT
DRAPE C-ARM 42X72 X-RAY (DRAPES) ×3 IMPLANT
DRAPE C-ARMOR (DRAPES) ×3 IMPLANT
DRAPE LAPAROTOMY 100X72X124 (DRAPES) ×3 IMPLANT
DRAPE MICROSCOPE LEICA (MISCELLANEOUS) IMPLANT
DRAPE SURG 17X23 STRL (DRAPES) ×3 IMPLANT
DURAPREP 26ML APPLICATOR (WOUND CARE) ×3 IMPLANT
ELECT BLADE 4.0 EZ CLEAN MEGAD (MISCELLANEOUS) ×3
ELECT REM PT RETURN 9FT ADLT (ELECTROSURGICAL) ×3
ELECTRODE BLDE 4.0 EZ CLN MEGD (MISCELLANEOUS) ×1 IMPLANT
ELECTRODE REM PT RTRN 9FT ADLT (ELECTROSURGICAL) ×1 IMPLANT
GAUZE 4X4 16PLY RFD (DISPOSABLE) IMPLANT
GAUZE SPONGE 4X4 12PLY STRL (GAUZE/BANDAGES/DRESSINGS) IMPLANT
GLOVE BIO SURGEON STRL SZ7.5 (GLOVE) ×6 IMPLANT
GLOVE BIOGEL PI IND STRL 7.5 (GLOVE) ×2 IMPLANT
GLOVE BIOGEL PI INDICATOR 7.5 (GLOVE) ×4
GLOVE EXAM NITRILE LRG STRL (GLOVE) IMPLANT
GLOVE EXAM NITRILE XL STR (GLOVE) IMPLANT
GLOVE EXAM NITRILE XS STR PU (GLOVE) IMPLANT
GOWN STRL REUS W/ TWL LRG LVL3 (GOWN DISPOSABLE) ×4 IMPLANT
GOWN STRL REUS W/ TWL XL LVL3 (GOWN DISPOSABLE) IMPLANT
GOWN STRL REUS W/TWL 2XL LVL3 (GOWN DISPOSABLE) IMPLANT
GOWN STRL REUS W/TWL LRG LVL3 (GOWN DISPOSABLE) ×8
GOWN STRL REUS W/TWL XL LVL3 (GOWN DISPOSABLE)
HEMOSTAT POWDER KIT SURGIFOAM (HEMOSTASIS) ×3 IMPLANT
KIT BASIN OR (CUSTOM PROCEDURE TRAY) ×3 IMPLANT
KIT POSITION SURG JACKSON T1 (MISCELLANEOUS) ×3 IMPLANT
KIT TURNOVER KIT B (KITS) ×3 IMPLANT
MILL MEDIUM DISP (BLADE) IMPLANT
NEEDLE HYPO 18GX1.5 BLUNT FILL (NEEDLE) IMPLANT
NEEDLE HYPO 22GX1.5 SAFETY (NEEDLE) ×3 IMPLANT
NEEDLE SPNL 18GX3.5 QUINCKE PK (NEEDLE) IMPLANT
NS IRRIG 1000ML POUR BTL (IV SOLUTION) ×3 IMPLANT
PACK LAMINECTOMY NEURO (CUSTOM PROCEDURE TRAY) ×3 IMPLANT
PAD ARMBOARD 7.5X6 YLW CONV (MISCELLANEOUS) ×9 IMPLANT
ROD SOLERA 70MM (Rod) ×4 IMPLANT
ROD SOLERA 70X5.5XNS TI (Rod) ×2 IMPLANT
SCREW LUM MA SOLERA 5.5X35 (Screw) ×6 IMPLANT
SCREW SET SOLERA (Screw) ×12 IMPLANT
SCREW SET SOLERA TI5.5 (Screw) ×6 IMPLANT
SCREW SOLERA 6.5X35MM (Screw) ×12 IMPLANT
SPACER SPNL STD 23X8XSTRL (Cage) ×1 IMPLANT
SPACER SPNL XLORDOTIC 23X8X (Cage) ×1 IMPLANT
SPCR SPNL STD 23X8XSTRL (Cage) ×1 IMPLANT
SPCR SPNL XLORDOTIC 23X8X (Cage) ×1 IMPLANT
SPONGE LAP 4X18 RFD (DISPOSABLE) IMPLANT
SPONGE SURGIFOAM ABS GEL 100 (HEMOSTASIS) IMPLANT
STRIP CLOSURE SKIN 1/2X4 (GAUZE/BANDAGES/DRESSINGS) IMPLANT
SUT MNCRL AB 3-0 PS2 18 (SUTURE) ×3 IMPLANT
SUT VIC AB 0 CT1 18XCR BRD8 (SUTURE) ×2 IMPLANT
SUT VIC AB 0 CT1 8-18 (SUTURE) ×4
SUT VIC AB 2-0 CP2 18 (SUTURE) ×6 IMPLANT
SYR 3ML LL SCALE MARK (SYRINGE) IMPLANT
TOWEL GREEN STERILE (TOWEL DISPOSABLE) ×3 IMPLANT
TOWEL GREEN STERILE FF (TOWEL DISPOSABLE) ×3 IMPLANT
TRAY FOLEY MTR SLVR 16FR STAT (SET/KITS/TRAYS/PACK) ×3 IMPLANT
WATER STERILE IRR 1000ML POUR (IV SOLUTION) ×3 IMPLANT

## 2020-01-07 NOTE — Brief Op Note (Signed)
01/07/2020  2:04 PM  PATIENT:  Crystal [name]  69 y.o. female  PRE-OPERATIVE DIAGNOSIS:  Lumbar stenosis with neurogenic claudication  POST-OPERATIVE DIAGNOSIS:  Lumbar stenosis with neurogenic claudication  PROCEDURE:  Procedure(s) with comments: Lumbar Four-Five and Lumbar Five-Sacral One open lumbar decompression and transforaminal lumbar interbody fusion (N/A) - Lumbar Four-Five and Lumbar Five-Sacral One open lumbar decompression and transforaminal lumbar interbody fusion  SURGEON:  Surgeon(s) and Role:    * Muntaha Vermette, Joyice Faster, MD - Primary    * Consuella Lose, MD - Assisting  PHYSICIAN ASSISTANT:   ANESTHESIA:   general  EBL:  650 mL   BLOOD ADMINISTERED:none  DRAINS: none   LOCAL MEDICATIONS USED:  LIDOCAINE   SPECIMEN:  No Specimen  DISPOSITION OF SPECIMEN:  N/A  COUNTS:  YES  TOURNIQUET:  * No tourniquets in log *  DICTATION: .Note written in EPIC  PLAN OF CARE: Admit to inpatient   PATIENT DISPOSITION:  PACU - hemodynamically stable.   Delay start of Pharmacological VTE agent (>24hrs) due to surgical blood loss or risk of bleeding: yes

## 2020-01-07 NOTE — H&P (Signed)
Surgical H&P Update  HPI: 69 y.o. woman w/ MMP including CHF, CKD, CAD, DM2, AICD, OSA on CPAP, obesity, with low back pain and neurogenic claudication, here for decompression and instrumented fusion. No changes in health since she was last seen. Still having symptoms and wishes to proceed with surgery.  PMHx:  Past Medical History:  Diagnosis Date  . AICD (automatic cardioverter/defibrillator) present    high RV threshold chronically, device was turned off in 2014; Device battery has been dead x 7 years; "turned it back on 06/22/2018"  . Anemia, iron deficiency   . Angioedema    felt to likely be due to ace inhibitors but says she has had this even off of medicines, appears to be tolerating ARBs chronically  . Anxiety   . Arthritis    "hands, legs, arms; bad in my back" (06/22/2018)  . Asthmatic bronchitis with status asthmaticus   . Bradycardia   . CHF (congestive heart failure) ([place])   . Chronic lower back pain   . Chronic pain   . Chronic renal insufficiency   . CKD (chronic kidney disease), stage III   . Coronary artery disease    Mild nonobstructive (30% LAD, 30% RCA) by 09/01/16 cath at [facility]  . Degenerative joint disease   . Depression   . Diabetes mellitus, type 2 ([place])   . Diabetic peripheral neuropathy (Grandfather)   . Dizziness   . Dyspnea on exertion   . Family history of adverse reaction to anesthesia    Mother has nausea  . GERD (gastroesophageal reflux disease)   . Gout   . Heart valve disorder   . History of blood transfusion 1981; ~ 2005; 12/2016   "childbirth; defibrillator OR; colostomy OR"  . History of mononucleosis 03/2014  . Hyperlipidemia   . Hypertension   . Hypothyroid   . Insomnia   . Intervertebral disc degeneration   . Ischemic cardiomyopathy   . LBBB (left bundle branch block)   . Myocardial infarction ([place]) dx'd ~ 2005  . Nonischemic cardiomyopathy ([place])    s/p ICD in 2005. EF has since recovered  . Obesity   . On home oxygen therapy     "2L at night" (06/22/2018)  . OSA on CPAP   . Pneumonia    "couple times; last time was 12/2016" (06/22/2018)  . PONV (postoperative nausea and vomiting)   . Presence of permanent cardiac pacemaker    [employer]  . Swelling   . Syncope   . Systemic hypertension   . Vitamin D deficiency    FamHx:  Family History  Problem Relation Age of Onset  . Hypertension Father   . Heart disease Father   . Cancer Father        prostate  . Heart disease Other        (Maternal side) Ischemic heart disease  . Diabetes Mellitus II Other   . Arrhythmia Mother   . Diabetes Mellitus II Mother        Borderline DM  . Hypertension Mother   . Asthma Mother   . Cancer Brother   . Dementia Brother   . Hypertension Brother   . Hypertension Daughter   . Hypertension Daughter   . Diabetes Daughter   . Hypertension Daughter   . Atrial fibrillation Daughter   . GER disease Daughter   . Hypertension Son   . Anxiety disorder Son   . Hypothyroidism Son   . Breast cancer Maternal Grandmother    SocHx:  reports that she has never smoked. She has never used smokeless tobacco. She reports that she does not drink alcohol and does not use drugs.  Physical Exam: AOx3, PERRL, FS, TM  Strength 5/5 x4, SILTx4  Assesment/Plan: 69 y.o. woman with neurogenic claudication due to lumbar canal stenosis with mobile spondylolisthesis at L4-5 and L5-S1, here for L4-5/L5-S1 open 2 level decompression and TLIF with PLF. Risks, benefits, and alternatives discussed and the patient would like to continue with surgery.  -OR today -4NP post-op  Judith Part, MD 01/07/20 7:41 AM

## 2020-01-07 NOTE — Progress Notes (Signed)
2125: CBG checked and 207 resulted. Insulin gtt d/c'd per diabetes coordinator and day shift RN instruction. SubQ SS insulin administered per MAR.

## 2020-01-07 NOTE — Progress Notes (Addendum)
Inpatient Diabetes Program Recommendations  AACE/ADA: New Consensus Statement on Inpatient Glycemic Control (2015)  Target Ranges:  Prepandial:   less than 140 mg/dL      Peak postprandial:   less than 180 mg/dL (1-2 hours)      Critically ill patients:  140 - 180 mg/dL   Lab Results  Component Value Date   GLUCAP 303 (H) 01/07/2020   HGBA1C 9.1 (H) 01/03/2020    Review of Glycemic Control Results for Crystal [name], Crystal [name] (MRN [id number]) as of 01/07/2020 15:08  Ref. Range 01/07/2020 06:35 01/07/2020 14:17  Glucose-Capillary Latest Ref Range: 70 - 99 mg/dL 212 (H) 303 (H)   Diabetes history: DM 2 Outpatient Diabetes medications:  U500 120 units with breakfast and U500 130 units q PM Current orders for Inpatient glycemic control:  IV insulin  Inpatient Diabetes Program Recommendations:    Note patient placed on IV insulin for surgery.  Patient takes U500 concentrated insulin at home.    May consider transitioning off insulin drip at PM Meal (will need to overlap U500 with insulin drip by 1-2 hours).  Consider ordering U500 65 units bid with breakfast and supper.  Also consider ordering Novolog moderate q 4 hours.    Addendum 5- Called and spoke with MD regarding transition orders for patient off IV insulin to her U500 insulin. Verbal order received.   Plan is for patient to receive 65 units of U500 with PM meal and then IV insulin will be d/c'd 1-2 hours after U500 dose.  Called and discussed with both PACU and 4N- RN to clarify orders and plans. They both verbalized understanding of orders.   Thanks,  Adah Perl, RN, BC-ADM Inpatient Diabetes Coordinator Pager [phone] (8a-5p)

## 2020-01-07 NOTE — Anesthesia Postprocedure Evaluation (Signed)
Anesthesia Post Note  Patient: Crystal [name]  Procedure(s) Performed: Lumbar Four-Five and Lumbar Five-Sacral One open lumbar decompression and transforaminal lumbar interbody fusion (N/A )     Patient location during evaluation: PACU Anesthesia Type: General Level of consciousness: awake and alert Pain management: pain level controlled Vital Signs Assessment: post-procedure vital signs reviewed and stable Respiratory status: spontaneous breathing, nonlabored ventilation and respiratory function stable Cardiovascular status: blood pressure returned to baseline and stable Postop Assessment: no apparent nausea or vomiting Anesthetic complications: no   No complications documented.  Last Vitals:  Vitals:   01/07/20 0622 01/07/20 0634  BP: (!) 128/44   Pulse:  79  Resp:  19  Temp:  36.5 C  SpO2:  96%    Last Pain:  Vitals:   01/07/20 0622  PainSc: 4                  Lidia Collum

## 2020-01-07 NOTE — Anesthesia Procedure Notes (Signed)
Procedure Name: Intubation Date/Time: 01/07/2020 7:48 AM Performed by: Haydn Hutsell T, CRNA Pre-anesthesia Checklist: Patient identified, Emergency Drugs available, Suction available and Patient being monitored Patient Re-evaluated:Patient Re-evaluated prior to induction Oxygen Delivery Method: Circle system utilized Preoxygenation: Pre-oxygenation with 100% oxygen Induction Type: IV induction Ventilation: Mask ventilation without difficulty and Oral airway inserted - appropriate to patient size Laryngoscope Size: Miller and 2 Grade View: Grade I Tube type: Oral Tube size: 7.5 mm Number of attempts: 1 Airway Equipment and Method: Stylet and Oral airway Placement Confirmation: ETT inserted through vocal cords under direct vision,  positive ETCO2 and breath sounds checked- equal and bilateral Secured at: 22 cm Tube secured with: Tape Dental Injury: Teeth and Oropharynx as per pre-operative assessment

## 2020-01-07 NOTE — Transfer of Care (Signed)
Immediate Anesthesia Transfer of Care Note  Patient: SHALECE STAFFA  Procedure(s) Performed: Lumbar Four-Five and Lumbar Five-Sacral One open lumbar decompression and transforaminal lumbar interbody fusion (N/A )  Patient Location: PACU  Anesthesia Type:General  Level of Consciousness: drowsy  Airway & Oxygen Therapy: Patient Spontanous Breathing and Patient connected to face mask oxygen  Post-op Assessment: Report given to RN, Post -op Vital signs reviewed and stable and Patient moving all extremities  Post vital signs: Reviewed and stable  Last Vitals:  Vitals Value Taken Time  BP 108/50 01/07/20 1416  Temp    Pulse 84 01/07/20 1431  Resp 9 01/07/20 1431  SpO2 100 % 01/07/20 1431  Vitals shown include unvalidated device data.  Last Pain:  Vitals:   01/07/20 0622  PainSc: 4       Patients Stated Pain Goal: 3 (15/04/13 6438)  Complications: No complications documented.

## 2020-01-07 NOTE — Progress Notes (Signed)
Patient has home CPAP for use, RT assisted patient in setting up CPAP. Patient will place when ready to use.

## 2020-01-07 NOTE — Progress Notes (Signed)
Neurosurgery Service Post-operative progress note  Assessment & Plan: 69 y.o. woman s/p 2 level open TLIF, seen in PACU, FCx4 with full strength and normal sensation.  -admit to 4NP -wean off insulin gtt as tolerated -RFP in AM given Epic report of possible TdP -diabetic diet -activity as tolerated, no brace -SCDs/TEDs, SQH on POD2  Judith Part  01/07/20 2:38 PM

## 2020-01-07 NOTE — Op Note (Signed)
PATIENT: Crystal [name]  DAY OF SURGERY: 01/07/20   PRE-OPERATIVE DIAGNOSIS:  Lumbar stenosis with neurogenic claudication, lumbar spondylolisthesis   POST-OPERATIVE DIAGNOSIS:  Same   PROCEDURE:  L4-5, L5-S1 laminectomies with bilateral facetectomies/foraminotomies; L4-5, L5-S1 TLIF with bilateral pedicle screw placement and posterolateral fusion   SURGEON:  Surgeon(s) and Role:    Judith Part, MD - Primary    Consuella Lose, MD - Assisting   ANESTHESIA: ETGA   BRIEF HISTORY: This is a 69 year old woman who presented with neurogenic claudication and low back pain. The patient was found to have severe canal stenosis and 2 level spondylolisthesis. This was discussed with the patient as well as risks, benefits, and alternatives and wished to proceed with surgery.   OPERATIVE DETAIL: The patient was taken to the operating room and anesthesia was induced by the anesthesia team. They were placed on the OR table in the prone position with padding of all pressure points. A formal time out was performed with two patient identifiers and confirmed the operative site. The operative site was marked, hair was clipped with surgical clippers, the area was then prepped and draped in a sterile fashion. Fluoro was used to localize the operative level and a midline incision was placed to expose from L4 to S1. Subperiosteal dissection was performed bilaterally and fluoroscopy was again used to confirm the surgical level.   Decompression was performed, which consisted of laminectomies at L4-5 and L5-S1. As expected, there was severe canal stenosis that was worst at the level of the L4-5 disc space and caused by significant ligamentous hypertrophy, which was calcified and adherent to the dura. The right L4, L5, and S1 pedicles were identified in the canal and the right pars and facets were removed to decompress the traversing nerve roots at both levels then access the disc space. At each level, a  discectomy was performed with endplate preparation and bone graft packing. An expandable lordotic cage (Medtronic) was then inserted and expanded using fluoroscopic guidance.   Instrumentation was then performed. Fluoroscopy was used to guide placement of bilateral pedicle screws ([employer]tronic) at L4, L5, and S1. These were placed by localizing the pedicle with standard landmarks, drilling a pilot hole, cannulating the pedicle with an awl, palpating for pedicle wall breaches, tapping, palpating, and then placing the screw. These were connected with rods bilaterally and final tightened according to manufacturer torque specifications. The bone was thoroughly decorticated over the fusion surface and the previously resected bone fragments were morselized and used as autograft..   Hemostasis was obtained and confirmed, all instrument and sponge counts were correct, the incision was then closed in layers. The patient was then returned to anesthesia for emergence. No apparent complications at the completion of the procedure.   EBL:  650mL   DRAINS: none   SPECIMENS: none   Judith Part, MD 01/07/20 8:06 AM

## 2020-01-08 ENCOUNTER — Encounter (HOSPITAL_COMMUNITY): Payer: Self-pay | Admitting: Neurological Surgery

## 2020-01-08 DIAGNOSIS — M48062 Spinal stenosis, lumbar region with neurogenic claudication: Secondary | ICD-10-CM | POA: Diagnosis not present

## 2020-01-08 LAB — RENAL FUNCTION PANEL
Albumin: 3.3 g/dL — ABNORMAL LOW (ref 3.5–5.0)
Anion gap: 11 (ref 5–15)
BUN: 36 mg/dL — ABNORMAL HIGH (ref 8–23)
CO2: 25 mmol/L (ref 22–32)
Calcium: 8.4 mg/dL — ABNORMAL LOW (ref 8.9–10.3)
Chloride: 102 mmol/L (ref 98–111)
Creatinine, Ser: 1.81 mg/dL — ABNORMAL HIGH (ref 0.44–1.00)
GFR calc Af Amer: 32 mL/min — ABNORMAL LOW (ref >60)
GFR calc non Af Amer: 28 mL/min — ABNORMAL LOW (ref >60)
Glucose, Bld: 211 mg/dL — ABNORMAL HIGH (ref 70–99)
Phosphorus: 2.8 mg/dL (ref 2.5–4.6)
Potassium: 4.5 mmol/L (ref 3.5–5.1)
Sodium: 138 mmol/L (ref 135–145)

## 2020-01-08 LAB — GLUCOSE, CAPILLARY
Glucose-Capillary: 193 mg/dL — ABNORMAL HIGH (ref 70–99)
Glucose-Capillary: 194 mg/dL — ABNORMAL HIGH (ref 70–99)
Glucose-Capillary: 195 mg/dL — ABNORMAL HIGH (ref 70–99)
Glucose-Capillary: 303 mg/dL — ABNORMAL HIGH (ref 70–99)

## 2020-01-08 MED ORDER — OXYCODONE-ACETAMINOPHEN 5-325 MG PO TABS: 1.0000 | ORAL_TABLET | ORAL | 0 refills | Status: DC | PRN

## 2020-01-08 NOTE — Progress Notes (Signed)
Pt bp 95/38 manually. She denies feeling dizzy, cold, or any other new symptoms. Dr. Zada Finders was notified and orders to proceed with discharge since pt is asymptomatic. She did receive scheduled Coreg, Hydralazine, and Torsemide this am. Prior to med administration her bp was 132/54. At that time the pt stated that her cardiologist wanted to lower her bp. Her Hydralazine order was recently changed from 2 times a day to 3 times a day. Pt is unsure of this and was educated to reach out to her cardiologist. Will continue to monitor pt's bp while waiting for ride.   Justice Rocher, RN

## 2020-01-08 NOTE — Progress Notes (Signed)
Pt discharged to home. Discharge information educated on with no questions, PIVs removed, and all room and personal belongings packed. Pt was escorted to private vehicle in wheelchair.   Justice Rocher, RN

## 2020-01-08 NOTE — Care Management CC44 (Signed)
Condition Code 44 Documentation Completed  Patient Details  Name: Crystal [name] MRN: [id number] Date of Birth: 10-27-1950   Condition Code 44 given:  Yes Patient signature on Condition Code 44 notice:  Yes Documentation of 2 MD's agreement:  Yes Code 44 added to claim:  Yes    Dawayne Patricia, RN 01/08/2020, 10:34 AM

## 2020-01-08 NOTE — Care Management Obs Status (Signed)
[facility] NOTIFICATION   Patient Details  Name: MELANYE HIRALDO MRN: [id number] Date of Birth: [birth year]   Medicare Observation Status Notification Given:  Yes    Dawayne Patricia, RN 01/08/2020, 10:34 AM

## 2020-01-08 NOTE — Progress Notes (Signed)
Neurosurgery Service Progress Note  Subjective: No acute events overnight, preop radicular symptoms resolved immediately post-op   Objective: Vitals:   01/08/20 0422 01/08/20 0600 01/08/20 0747 01/08/20 0809  BP:  (!) 122/55 (!) 120/51 (!) 132/54  Pulse:  91 97 (!) 101  Resp:  18 18 (!) 21  Temp: 99.8 F (37.7 C)  98.1 F (36.7 C)   TempSrc:   Oral   SpO2:  95% 99% 100%  Weight:      Height:       Temp (24hrs), Avg:98.8 F (37.1 C), Min:98.1 F (36.7 C), Max:99.8 F (37.7 C)  CBC Latest Ref Rng & Units 01/07/2020 01/03/2020 08/20/2019  WBC 4.0 - 10.5 K/uL - 10.3 7.8  Hemoglobin 12.0 - 15.0 g/dL 8.5(L) 11.1(L) 10.5(L)  Hematocrit 36 - 46 % 25.0(L) 36.9 34.2(L)  Platelets 150 - 400 K/uL - 253 276   BMP Latest Ref Rng & Units 01/08/2020 01/07/2020 01/03/2020  Glucose 70 - 99 mg/dL 211(H) 313(H) 159(H)  BUN 8 - 23 mg/dL 36(H) 38(H) 57(H)  Creatinine 0.44 - 1.00 mg/dL 1.81(H) 1.60(H) 1.97(H)  BUN/Creat Ratio 12 - 28 - - -  Sodium 135 - 145 mmol/L 138 136 139  Potassium 3.5 - 5.1 mmol/L 4.5 5.8(H) 4.5  Chloride 98 - 111 mmol/L 102 103 101  CO2 22 - 32 mmol/L 25 - 27  Calcium 8.9 - 10.3 mg/dL 8.4(L) - 10.2    Intake/Output Summary (Last 24 hours) at 01/08/2020 0847 Last data filed at 01/08/2020 6144 Gross per 24 hour  Intake 3367.43 ml  Output 2895 ml  Net 472.43 ml    Current Facility-Administered Medications:  .  0.9 %  sodium chloride infusion, 250 mL, Intravenous, Continuous, An Lannan, Joyice Faster, MD .  acetaminophen (TYLENOL) tablet 650 mg, 650 mg, Oral, Q4H PRN, 650 mg at 01/08/20 0818 **OR** acetaminophen (TYLENOL) suppository 650 mg, 650 mg, Rectal, Q4H PRN, Kmari Brian A, MD .  albuterol (PROVENTIL) (2.5 MG/3ML) 0.083% nebulizer solution 2.5 mg, 2.5 mg, Nebulization, Q6H PRN, Blenda Nicely, RPH .  allopurinol (ZYLOPRIM) tablet 100 mg, 100 mg, Oral, Daily, Judith Part, MD, 100 mg at 01/08/20 0818 .  carvedilol (COREG) tablet 25 mg, 25 mg, Oral, BID,  Judith Part, MD, 25 mg at 01/08/20 0819 .  cyclobenzaprine (FLEXERIL) tablet 10 mg, 10 mg, Oral, TID PRN, Michaeline Eckersley A, MD .  dextrose 50 % solution 0-50 mL, 0-50 mL, Intravenous, PRN, Kemari Mares A, MD .  docusate sodium (COLACE) capsule 100 mg, 100 mg, Oral, BID, Judith Part, MD, 100 mg at 01/08/20 0819 .  DULoxetine (CYMBALTA) DR capsule 60 mg, 60 mg, Oral, Daily, Judith Part, MD, 60 mg at 01/08/20 0819 .  gabapentin (NEURONTIN) capsule 300 mg, 300 mg, Oral, QHS, Chancie Lampert, Joyice Faster, MD, 300 mg at 01/07/20 2131 .  hydrALAZINE (APRESOLINE) tablet 25 mg, 25 mg, Oral, TID, Judith Part, MD, 25 mg at 01/08/20 0819 .  HYDROmorphone (DILAUDID) injection 1 mg, 1 mg, Intravenous, Q3H PRN, Malissa Slay A, MD .  insulin aspart (novoLOG) injection 0-15 Units, 0-15 Units, Subcutaneous, Q4H, Judith Part, MD, 3 Units at 01/08/20 [phone] .  insulin regular human CONCENTRATED (HUMULIN R) 500 UNIT/ML kwikpen 65 Units, 65 Units, Subcutaneous, BID WC, Judith Part, MD, 65 Units at 01/08/20 [phone] .  levothyroxine (SYNTHROID) tablet 88 mcg, 88 mcg, Oral, QAC breakfast, Judith Part, MD, 88 mcg at 01/08/20 [phone] .  loratadine (CLARITIN) tablet 10 mg, 10 mg,  Oral, Daily, Judith Part, MD, 10 mg at 01/08/20 0819 .  menthol-cetylpyridinium (CEPACOL) lozenge 3 mg, 1 lozenge, Oral, PRN **OR** phenol (CHLORASEPTIC) mouth spray 1 spray, 1 spray, Mouth/Throat, PRN, Aynsley Fleet A, MD .  montelukast (SINGULAIR) tablet 10 mg, 10 mg, Oral, QHS, Kaliya Shreiner, Joyice Faster, MD, 10 mg at 01/07/20 2130 .  ondansetron (ZOFRAN) tablet 4 mg, 4 mg, Oral, Q6H PRN **OR** ondansetron (ZOFRAN) injection 4 mg, 4 mg, Intravenous, Q6H PRN, Ayo Smoak A, MD .  oxyCODONE (Oxy IR/ROXICODONE) immediate release tablet 10 mg, 10 mg, Oral, Q4H PRN, Judith Part, MD .  oxyCODONE (Oxy IR/ROXICODONE) immediate release tablet 5 mg, 5 mg, Oral, Q4H PRN, Judith Part, MD, 5 mg at 01/07/20 2129 .  pantoprazole (PROTONIX) EC tablet 40 mg, 40 mg, Oral, QHS, Tanaka Gillen, Joyice Faster, MD, 40 mg at 01/07/20 2129 .  polyethylene glycol (MIRALAX / GLYCOLAX) packet 17 g, 17 g, Oral, Daily PRN, Judith Part, MD .  rosuvastatin (CRESTOR) tablet 10 mg, 10 mg, Oral, QHS, Trenia Tennyson, Joyice Faster, MD, 10 mg at 01/07/20 2129 .  sodium chloride flush (NS) 0.9 % injection 3 mL, 3 mL, Intravenous, Q12H, Tayva Easterday, Joyice Faster, MD, 3 mL at 01/08/20 0821 .  sodium chloride flush (NS) 0.9 % injection 3 mL, 3 mL, Intravenous, PRN, Judith Part, MD, 3 mL at 01/08/20 0821 .  temazepam (RESTORIL) capsule 15 mg, 15 mg, Oral, QHS PRN, Judith Part, MD .  torsemide (DEMADEX) tablet 20 mg, 20 mg, Oral, Daily, Twila Rappa, Joyice Faster, MD, 20 mg at 01/08/20 0820   Physical Exam: AOx3, PERRL, EOMI, FS, Strength 5/5 x4, SILTx4  Assessment & Plan: 69 y.o. woman s/p open L4-5/L5-S1 TLIF with decompression and instrumented posterolateral fusion, recovering well.  -discharge home today  Judith Part  01/08/20 8:47 AM

## 2020-01-08 NOTE — Evaluation (Signed)
Occupational Therapy Evaluation Patient Details Name: Crystal [name] MRN: [id number] DOB: Aug 06, 1950 Today's Date: 01/08/2020    History of Present Illness 69 y.o. woman w/ MMP including CHF, CKD, CAD, DM2, AICD, OSA on CPAP, obesity, with low back pain and neurogenic claudication, here for decompression and instrumented fusion. Pt underwent L4-S1 laminectomy with posterolateral fusion on 6/15.   Clinical Impression   All education completed as detailed below and reinforced with written handout. Pt verbalized understanding. No further OT needs.    Follow Up Recommendations  No OT follow up    Equipment Recommendations  None recommended by OT    Recommendations for Other Services       Precautions / Restrictions Precautions Precautions: Back Precaution Booklet Issued: Yes (comment) Precaution Comments: reviewed back precautions related to ADL and IADL, reinforced with written handout Restrictions Weight Bearing Restrictions: No      Mobility Bed Mobility               General bed mobility comments: pt received leaving bathroom and left sitting in recliner. PT verbally reviews log roll technique with patient. Pt expressing understanding and denies the need to practice technique at this time  Transfers Overall transfer level: Modified independent Equipment used: None Transfers: Sit to/from Stand Sit to Stand: Modified independent (Device/Increase time)         General transfer comment: pt pushing up from armrest of recliner    Balance Overall balance assessment: Modified Independent                                         ADL either performed or assessed with clinical judgement   ADL Overall ADL's : Needs assistance/impaired Eating/Feeding: Independent   Grooming: Modified independent;Standing Grooming Details (indicate cue type and reason): educated in 2 cup method for tooth brushing and washcloth instead of leaning over sink to wash  face Upper Body Bathing: Supervision/ safety;Standing Upper Body Bathing Details (indicate cue type and reason): recommended use of long handled bath sponge Lower Body Bathing: Minimal assistance;Sit to/from stand Lower Body Bathing Details (indicate cue type and reason): recommended use of long handled bath sponge Upper Body Dressing : Set up;Sitting   Lower Body Dressing: Minimal assistance;Sit to/from stand Lower Body Dressing Details (indicate cue type and reason): assist for socks and to start panties over feet, pt will rely on her family to assist, typically does not wear socks and wears dresses Toilet Transfer: Modified Independent   Toileting- Clothing Manipulation and Hygiene: Modified independent;Sitting/lateral lean Toileting - Clothing Manipulation Details (indicate cue type and reason): educated in use of tongs to avoid twisting with Advertising account planner Details (indicate cue type and reason): educated in use of 3 in 1 as shower seat if pt so chooses Functional mobility during ADLs: Modified independent General ADL Comments: Educated in IADL to avoid, pt will defer heavy housework, Medical sales representative and putting sheets on bed to her daughter     Vision Patient Visual Report: No change from baseline       Perception     Praxis      Pertinent Vitals/Pain Pain Assessment: Faces Faces Pain Scale: Hurts a little bit Pain Location: back Pain Descriptors / Indicators: Sore Pain Intervention(s): Monitored during session;Repositioned     Hand Dominance Right   Extremity/Trunk Assessment Upper Extremity Assessment Upper Extremity Assessment: Overall WFL for tasks assessed   Lower Extremity  Assessment Lower Extremity Assessment: Defer to PT evaluation   Cervical / Trunk Assessment Cervical / Trunk Assessment: Normal   Communication Communication Communication: No difficulties   Cognition Arousal/Alertness: Awake/alert Behavior During Therapy: WFL for tasks  assessed/performed Overall Cognitive Status: Within Functional Limits for tasks assessed                                     General Comments  VSS on RA    Exercises     Shoulder Instructions      Home Living Family/patient expects to be discharged to:: Private residence Living Arrangements: Children;Spouse/significant other Available Help at Discharge: Family;Available 24 hours/day Type of Home: House Home Access: Level entry     Home Layout: Two level Alternate Level Stairs-Number of Steps: flight Alternate Level Stairs-Rails: Left Bathroom Shower/Tub: Tub/shower unit   Bathroom Toilet: Handicapped height     Home Equipment: Environmental consultant - 2 wheels;Cane - single point;Bedside commode;Wheelchair - manual;Tub bench   Additional Comments: pt reports RW and 3 in 1      Prior Functioning/Environment Level of Independence: Independent        Comments: driving, peforming some iADL tasks        OT Problem List:        OT Treatment/Interventions:      OT Goals(Current goals can be found in the care plan section) Acute Rehab OT Goals Patient Stated Goal: To go home  OT Frequency:     Barriers to D/C:            Co-evaluation              AM-PAC OT "6 Clicks" Daily Activity     Outcome Measure Help from another person eating meals?: None Help from another person taking care of personal grooming?: None Help from another person toileting, which includes using toliet, bedpan, or urinal?: A Little Help from another person bathing (including washing, rinsing, drying)?: A Little Help from another person to put on and taking off regular upper body clothing?: None Help from another person to put on and taking off regular lower body clothing?: A Little 6 Click Score: 21   End of Session    Activity Tolerance: Patient tolerated treatment well Patient left: in chair;with call bell/phone within reach  OT Visit Diagnosis: Pain                Time:  4784-1282 OT Time Calculation (min): 14 min Charges:  OT General Charges $OT Visit: 1 Visit OT Evaluation $OT Eval Low Complexity: 1 Low  Nestor Lewandowsky, OTR/L Acute Rehabilitation Services Pager: [phone] Office: [phone]  Malka So 01/08/2020, 9:48 AM

## 2020-01-08 NOTE — Evaluation (Signed)
Physical Therapy Evaluation Patient Details Name: Crystal [name] MRN: [id number] DOB: 1950/12/04 Today's Date: 01/08/2020   History of Present Illness  69 y.o. woman w/ MMP including CHF, CKD, CAD, DM2, AICD, OSA on CPAP, obesity, with low back pain and neurogenic claudication, here for decompression and instrumented fusion. Pt underwent L4-S1 laminectomy with posterolateral fusion on 6/15.  Clinical Impression  Pt presents to PT with deficits in balance, gait, activity tolerance, and knowledge of back precautions. Pt ambulating well with use of RW, no significant gait deviations observed. Pt does fatigue quickly with activity, but recovers quickly with rest breaks as well. PT provides verbal education of spinal precautions to pt who verbalizes understanding. PT also verbally reviews log roll and bed mobility techniques, pt expresses understanding. Pt will benefit from continued acute PT services while hospitalized to allow for further reinforcement of back precautions and bed mobility technique. PT recommend no PT follow-up or DME at the time of discharge.    Follow Up Recommendations No PT follow up    Equipment Recommendations  None recommended by PT    Recommendations for Other Services       Precautions / Restrictions Precautions Precautions: Back Precaution Booklet Issued: No Precaution Comments: PT verbally reviews back precautions Restrictions Weight Bearing Restrictions: No      Mobility  Bed Mobility               General bed mobility comments: pt received leaving bathroom and left sitting in recliner. PT verbally reviews log roll technique with patient. Pt expressing understanding and denies the need to practice technique at this time  Transfers Overall transfer level: Needs assistance Equipment used: None Transfers: Sit to/from Stand Sit to Stand: Modified independent (Device/Increase time)         General transfer comment: pt pushing up from armrest of  recliner  Ambulation/Gait Ambulation/Gait assistance: Modified independent (Device/Increase time) Gait Distance (Feet): 300 Feet Assistive device: Rolling walker (2 wheeled) Gait Pattern/deviations: Step-through pattern Gait velocity: functional Gait velocity interpretation: >2.62 ft/sec, indicative of community ambulatory General Gait Details: pt with steady step through gait, no significant gait deviations noted  Stairs Stairs: Yes Stairs assistance: Supervision Stair Management: One rail Left Number of Stairs: 10    Wheelchair Mobility    Modified Rankin (Stroke Patients Only)       Balance Overall balance assessment: Modified Independent                                           Pertinent Vitals/Pain Pain Assessment: Faces Faces Pain Scale: Hurts a little bit Pain Location: back Pain Descriptors / Indicators: Sore Pain Intervention(s): Monitored during session    Home Living Family/patient expects to be discharged to:: Private residence Living Arrangements: Children;Spouse/significant other Available Help at Discharge: Family;Available 24 hours/day Type of Home: House Home Access: Level entry     Home Layout: Two level Home Equipment: Walker - 2 wheels;Cane - single point;Bedside commode;Wheelchair - manual;Tub bench Additional Comments: RW confirmed, other DME information obtained from chart review    Prior Function Level of Independence: Independent         Comments: driving, peforming some iADL tasks     Hand Dominance        Extremity/Trunk Assessment   Upper Extremity Assessment Upper Extremity Assessment: Overall WFL for tasks assessed    Lower Extremity Assessment Lower Extremity Assessment: Overall [facility]  for tasks assessed    Cervical / Trunk Assessment Cervical / Trunk Assessment: Normal  Communication   Communication: No difficulties  Cognition Arousal/Alertness: Awake/alert Behavior During Therapy: WFL for  tasks assessed/performed Overall Cognitive Status: Within Functional Limits for tasks assessed                                        General Comments General comments (skin integrity, edema, etc.): VSS on RA    Exercises     Assessment/Plan    PT Assessment Patient needs continued PT services  PT Problem List Decreased activity tolerance;Decreased balance;Decreased mobility;Decreased knowledge of use of DME;Decreased knowledge of precautions       PT Treatment Interventions DME instruction;Stair training;Functional mobility training;Gait training;Balance training;Neuromuscular re-education;Patient/family education    PT Goals (Current goals can be found in the Care Plan section)  Acute Rehab PT Goals Patient Stated Goal: To go home PT Goal Formulation: With patient Time For Goal Achievement: 01/22/20 Potential to Achieve Goals: Good Additional Goals Additional Goal #1: Pt will maintain dynamic standing balance within 10 inches of her base of support without UE support, independently.    Frequency Min 5X/week   Barriers to discharge        Co-evaluation               AM-PAC PT "6 Clicks" Mobility  Outcome Measure Help needed turning from your back to your side while in a flat bed without using bedrails?: None Help needed moving from lying on your back to sitting on the side of a flat bed without using bedrails?: None Help needed moving to and from a bed to a chair (including a wheelchair)?: None Help needed standing up from a chair using your arms (e.g., wheelchair or bedside chair)?: None Help needed to walk in hospital room?: None Help needed climbing 3-5 steps with a railing? : None 6 Click Score: 24    End of Session   Activity Tolerance: Patient tolerated treatment well Patient left: in chair;with call bell/phone within reach Nurse Communication: Mobility status PT Visit Diagnosis: Other abnormalities of gait and mobility (R26.89)     Time: 6789-3810 PT Time Calculation (min) (ACUTE ONLY): 9 min   Charges:   PT Evaluation $PT Eval Low Complexity: [place], PT, DPT Acute Rehabilitation Pager: [phone]   Zenaida Niece 01/08/2020, 8:51 AM

## 2020-01-08 NOTE — Discharge Summary (Signed)
Discharge Summary  Date of Admission: 01/07/2020  Date of Discharge: 01/08/20  Attending Physician: Emelda Brothers, MD  Hospital Course: Patient was admitted following an uncomplicated open B8-6/8-5 decompression / TLIF / PLF. She was recovered in PACU and transferred to 4NP. She required an insulin gtt intra-op, but this was weaned off within a few hours. She has a h/o CKD and post-op labs showed creatinine within her normal range. Her hospital course was uncomplicated and the patient was discharged home on 01/08/20. She will follow up in clinic with me in 2 wees.  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

## 2020-01-08 NOTE — Discharge Instructions (Signed)
Discharge Instructions ° °No restriction in activities, slowly increase your activity back to normal.  ° °Your incision is closed with dermabond (purple glue). This will naturally fall off over the next 1-2 weeks.  ° °Okay to shower on the day of discharge. Use regular soap and water and try to be gentle when cleaning your incision.  ° °Follow up with Dr. Brecklynn Jian in 2 weeks after discharge. If you do not already have a discharge appointment, please call his office at [phone] to schedule a follow up appointment. If you have any concerns or questions, please call the office and let us know. °

## 2020-02-12 DIAGNOSIS — K649 Unspecified hemorrhoids: Secondary | ICD-10-CM | POA: Diagnosis not present

## 2020-02-12 DIAGNOSIS — I130 Hypertensive heart and chronic kidney disease with heart failure and stage 1 through stage 4 chronic kidney disease, or unspecified chronic kidney disease: Secondary | ICD-10-CM | POA: Diagnosis not present

## 2020-02-12 DIAGNOSIS — N184 Chronic kidney disease, stage 4 (severe): Secondary | ICD-10-CM | POA: Diagnosis not present

## 2020-02-12 DIAGNOSIS — K921 Melena: Secondary | ICD-10-CM | POA: Diagnosis not present

## 2020-02-12 DIAGNOSIS — K922 Gastrointestinal hemorrhage, unspecified: Secondary | ICD-10-CM | POA: Diagnosis not present

## 2020-02-12 DIAGNOSIS — I509 Heart failure, unspecified: Secondary | ICD-10-CM | POA: Diagnosis not present

## 2020-02-12 DIAGNOSIS — D62 Acute posthemorrhagic anemia: Secondary | ICD-10-CM | POA: Diagnosis not present

## 2020-02-14 ENCOUNTER — Other Ambulatory Visit: Payer: Self-pay

## 2020-02-14 ENCOUNTER — Ambulatory Visit (HOSPITAL_COMMUNITY)
Admission: RE | Admit: 2020-02-14 | Discharge: 2020-02-14 | Disposition: A | Discharge status: Home / Self Care | Payer: Medicare Other | Source: Ambulatory Visit | Attending: Internal Medicine | Admitting: Internal Medicine

## 2020-02-14 DIAGNOSIS — D649 Anemia, unspecified: Secondary | ICD-10-CM | POA: Insufficient documentation

## 2020-02-14 LAB — PREPARE RBC (CROSSMATCH)

## 2020-02-14 MED ORDER — SODIUM CHLORIDE 0.9% IV SOLUTION: Freq: Once | INTRAVENOUS | Status: AC

## 2020-02-14 MED ORDER — FUROSEMIDE 10 MG/ML IJ SOLN
20.0000 mg | Freq: Once | INTRAMUSCULAR | Status: AC
  Administered 2020-02-14: 20 mg via INTRAVENOUS
  Filled 2020-02-14: qty 2

## 2020-02-14 NOTE — Progress Notes (Signed)
PATIENT CARE CENTER NOTE  Diagnosis: Anemia    Provider: Crist Infante, MD   Procedure: 2 units blood   Note: Patient received 2 units PRBC's via PIV. Lasix 20 mg IV given in between units. Patient tolerated transfusions well with no adverse reaction. Vital signs stable. Discharge instructions given. Patient alert, oriented and ambulatory at discharge.

## 2020-02-14 NOTE — Discharge Instructions (Signed)

## 2020-02-16 LAB — BPAM RBC
Blood Product Expiration Date: 202108212359
Blood Product Expiration Date: 202108222359
ISSUE DATE / TIME: 202107231005
ISSUE DATE / TIME: 202107231005
Unit Type and Rh: 7300
Unit Type and Rh: 7300

## 2020-02-16 LAB — TYPE AND SCREEN
ABO/RH(D): B POS
Antibody Screen: NEGATIVE
Unit division: 0
Unit division: 0

## 2020-02-17 ENCOUNTER — Encounter (HOSPITAL_COMMUNITY): Payer: Medicare Other

## 2020-02-25 DIAGNOSIS — N1832 Chronic kidney disease, stage 3b: Secondary | ICD-10-CM | POA: Diagnosis not present

## 2020-02-25 DIAGNOSIS — E559 Vitamin D deficiency, unspecified: Secondary | ICD-10-CM | POA: Diagnosis not present

## 2020-02-25 DIAGNOSIS — E1122 Type 2 diabetes mellitus with diabetic chronic kidney disease: Secondary | ICD-10-CM | POA: Diagnosis not present

## 2020-02-25 DIAGNOSIS — D631 Anemia in chronic kidney disease: Secondary | ICD-10-CM | POA: Diagnosis not present

## 2020-02-25 DIAGNOSIS — D509 Iron deficiency anemia, unspecified: Secondary | ICD-10-CM | POA: Diagnosis not present

## 2020-02-25 DIAGNOSIS — I5042 Chronic combined systolic (congestive) and diastolic (congestive) heart failure: Secondary | ICD-10-CM | POA: Diagnosis not present

## 2020-02-25 DIAGNOSIS — I129 Hypertensive chronic kidney disease with stage 1 through stage 4 chronic kidney disease, or unspecified chronic kidney disease: Secondary | ICD-10-CM | POA: Diagnosis not present

## 2020-02-26 ENCOUNTER — Other Ambulatory Visit: Payer: Self-pay | Admitting: Gastroenterology

## 2020-02-26 DIAGNOSIS — N289 Disorder of kidney and ureter, unspecified: Secondary | ICD-10-CM | POA: Diagnosis not present

## 2020-02-26 DIAGNOSIS — D509 Iron deficiency anemia, unspecified: Secondary | ICD-10-CM | POA: Diagnosis not present

## 2020-02-26 DIAGNOSIS — R195 Other fecal abnormalities: Secondary | ICD-10-CM | POA: Diagnosis not present

## 2020-03-05 ENCOUNTER — Other Ambulatory Visit (HOSPITAL_COMMUNITY): Payer: Self-pay | Admitting: *Deleted

## 2020-03-05 NOTE — Discharge Instructions (Signed)

## 2020-03-06 ENCOUNTER — Other Ambulatory Visit: Payer: Self-pay

## 2020-03-06 ENCOUNTER — Encounter (HOSPITAL_COMMUNITY)
Admission: RE | Admit: 2020-03-06 | Discharge: 2020-03-06 | Disposition: A | Discharge status: Home / Self Care | Payer: Medicare Other | Source: Ambulatory Visit | Attending: Nephrology | Admitting: Nephrology

## 2020-03-06 DIAGNOSIS — D631 Anemia in chronic kidney disease: Secondary | ICD-10-CM | POA: Diagnosis not present

## 2020-03-06 DIAGNOSIS — N183 Chronic kidney disease, stage 3 unspecified: Secondary | ICD-10-CM | POA: Diagnosis not present

## 2020-03-06 MED ORDER — SODIUM CHLORIDE 0.9 % IV SOLN
510.0000 mg | INTRAVENOUS | Status: DC
  Administered 2020-03-06: 510 mg via INTRAVENOUS
  Filled 2020-03-06: qty 17

## 2020-03-10 ENCOUNTER — Other Ambulatory Visit (HOSPITAL_COMMUNITY)
Admission: RE | Admit: 2020-03-10 | Discharge: 2020-03-10 | Disposition: A | Discharge status: Home / Self Care | Payer: Medicare Other | Source: Ambulatory Visit | Attending: Gastroenterology | Admitting: Gastroenterology

## 2020-03-10 DIAGNOSIS — Z01812 Encounter for preprocedural laboratory examination: Secondary | ICD-10-CM | POA: Diagnosis not present

## 2020-03-10 DIAGNOSIS — Z20822 Contact with and (suspected) exposure to covid-19: Secondary | ICD-10-CM | POA: Diagnosis not present

## 2020-03-10 LAB — SARS CORONAVIRUS 2 (TAT 6-24 HRS): SARS Coronavirus 2: NEGATIVE

## 2020-03-12 ENCOUNTER — Ambulatory Visit (HOSPITAL_COMMUNITY)
Admission: RE | Admit: 2020-03-12 | Discharge: 2020-03-12 | Disposition: A | Discharge status: Home / Self Care | Payer: Medicare Other | Source: Ambulatory Visit | Attending: Nephrology | Admitting: Nephrology

## 2020-03-12 ENCOUNTER — Other Ambulatory Visit: Payer: Self-pay

## 2020-03-12 DIAGNOSIS — D631 Anemia in chronic kidney disease: Secondary | ICD-10-CM | POA: Diagnosis not present

## 2020-03-12 DIAGNOSIS — N183 Chronic kidney disease, stage 3 unspecified: Secondary | ICD-10-CM | POA: Insufficient documentation

## 2020-03-12 MED ORDER — SODIUM CHLORIDE 0.9 % IV SOLN
510.0000 mg | INTRAVENOUS | Status: DC
  Administered 2020-03-12: 510 mg via INTRAVENOUS
  Filled 2020-03-12: qty 17

## 2020-03-13 ENCOUNTER — Encounter (HOSPITAL_COMMUNITY)
Admission: RE | Disposition: A | Discharge status: Home / Self Care | Payer: Self-pay | Source: Home / Self Care | Attending: Gastroenterology

## 2020-03-13 ENCOUNTER — Ambulatory Visit (HOSPITAL_COMMUNITY): Payer: Medicare Other | Admitting: Certified Registered"

## 2020-03-13 ENCOUNTER — Ambulatory Visit (HOSPITAL_COMMUNITY)
Admission: RE | Admit: 2020-03-13 | Discharge: 2020-03-13 | Disposition: A | Discharge status: Home / Self Care | Payer: Medicare Other | Attending: Gastroenterology | Admitting: Gastroenterology

## 2020-03-13 ENCOUNTER — Encounter (HOSPITAL_COMMUNITY): Payer: Medicare Other

## 2020-03-13 ENCOUNTER — Encounter (HOSPITAL_COMMUNITY): Payer: Self-pay | Admitting: Gastroenterology

## 2020-03-13 ENCOUNTER — Other Ambulatory Visit: Payer: Self-pay

## 2020-03-13 DIAGNOSIS — I252 Old myocardial infarction: Secondary | ICD-10-CM | POA: Diagnosis not present

## 2020-03-13 DIAGNOSIS — E1122 Type 2 diabetes mellitus with diabetic chronic kidney disease: Secondary | ICD-10-CM | POA: Insufficient documentation

## 2020-03-13 DIAGNOSIS — N183 Chronic kidney disease, stage 3 unspecified: Secondary | ICD-10-CM | POA: Insufficient documentation

## 2020-03-13 DIAGNOSIS — Z6839 Body mass index (BMI) 39.0-39.9, adult: Secondary | ICD-10-CM | POA: Diagnosis not present

## 2020-03-13 DIAGNOSIS — K5521 Angiodysplasia of colon with hemorrhage: Secondary | ICD-10-CM | POA: Diagnosis not present

## 2020-03-13 DIAGNOSIS — K317 Polyp of stomach and duodenum: Secondary | ICD-10-CM | POA: Diagnosis not present

## 2020-03-13 DIAGNOSIS — I2510 Atherosclerotic heart disease of native coronary artery without angina pectoris: Secondary | ICD-10-CM | POA: Insufficient documentation

## 2020-03-13 DIAGNOSIS — I130 Hypertensive heart and chronic kidney disease with heart failure and stage 1 through stage 4 chronic kidney disease, or unspecified chronic kidney disease: Secondary | ICD-10-CM | POA: Diagnosis not present

## 2020-03-13 DIAGNOSIS — I255 Ischemic cardiomyopathy: Secondary | ICD-10-CM | POA: Diagnosis not present

## 2020-03-13 DIAGNOSIS — G4733 Obstructive sleep apnea (adult) (pediatric): Secondary | ICD-10-CM | POA: Insufficient documentation

## 2020-03-13 DIAGNOSIS — M1909 Primary osteoarthritis, other specified site: Secondary | ICD-10-CM | POA: Diagnosis not present

## 2020-03-13 DIAGNOSIS — Z950 Presence of cardiac pacemaker: Secondary | ICD-10-CM | POA: Insufficient documentation

## 2020-03-13 DIAGNOSIS — I509 Heart failure, unspecified: Secondary | ICD-10-CM | POA: Insufficient documentation

## 2020-03-13 DIAGNOSIS — Z980 Intestinal bypass and anastomosis status: Secondary | ICD-10-CM | POA: Insufficient documentation

## 2020-03-13 DIAGNOSIS — D509 Iron deficiency anemia, unspecified: Secondary | ICD-10-CM | POA: Insufficient documentation

## 2020-03-13 DIAGNOSIS — E1142 Type 2 diabetes mellitus with diabetic polyneuropathy: Secondary | ICD-10-CM | POA: Insufficient documentation

## 2020-03-13 DIAGNOSIS — B3781 Candidal esophagitis: Secondary | ICD-10-CM | POA: Diagnosis not present

## 2020-03-13 DIAGNOSIS — E039 Hypothyroidism, unspecified: Secondary | ICD-10-CM | POA: Diagnosis not present

## 2020-03-13 HISTORY — PX: ESOPHAGOGASTRODUODENOSCOPY (EGD) WITH PROPOFOL: SHX5813

## 2020-03-13 HISTORY — PX: POLYPECTOMY: SHX5525

## 2020-03-13 HISTORY — PX: HOT HEMOSTASIS: SHX5433

## 2020-03-13 HISTORY — PX: FLEXIBLE SIGMOIDOSCOPY: SHX5431

## 2020-03-13 HISTORY — PX: HEMOSTASIS CLIP PLACEMENT: SHX6857

## 2020-03-13 LAB — GLUCOSE, CAPILLARY: Glucose-Capillary: 289 mg/dL — ABNORMAL HIGH (ref 70–99)

## 2020-03-13 SURGERY — ESOPHAGOGASTRODUODENOSCOPY (EGD) WITH PROPOFOL
Anesthesia: Monitor Anesthesia Care

## 2020-03-13 MED ORDER — PROPOFOL 500 MG/50ML IV EMUL
INTRAVENOUS | Status: DC | PRN
  Administered 2020-03-13: 125 ug/kg/min via INTRAVENOUS

## 2020-03-13 MED ORDER — LACTATED RINGERS IV SOLN
INTRAVENOUS | Status: AC | PRN
  Administered 2020-03-13: 1000 mL via INTRAVENOUS

## 2020-03-13 MED ORDER — SODIUM CHLORIDE 0.9 % IV SOLN: INTRAVENOUS | Status: DC

## 2020-03-13 MED ORDER — PROPOFOL 500 MG/50ML IV EMUL
INTRAVENOUS | Status: DC | PRN
  Administered 2020-03-13 (×2): 20 mg via INTRAVENOUS

## 2020-03-13 SURGICAL SUPPLY — 14 items

## 2020-03-13 NOTE — Op Note (Signed)
[facility] Patient Name: Crystal [name] Procedure Date: 03/13/2020 MRN: [id number] Attending MD: Carol Ada , MD Date of Birth: 1951/03/03 CSN: [id number] Age: 69 Admit Type: Outpatient Procedure:                Flexible Sigmoidoscopy Indications:              Iron deficiency anemia Providers:                Carol Ada, MD, Wynonia Sours, RN, Tyrone Apple, Technician, Maudry Diego, CRNA Referring MD:              Medicines:                Propofol per Anesthesia Complications:            No immediate complications. Estimated Blood Loss:     Estimated blood loss: none. Procedure:                Pre-Anesthesia Assessment:                           - Prior to the procedure, a History and Physical                            was performed, and patient medications and                            allergies were reviewed. The patient's tolerance of                            previous anesthesia was also reviewed. The risks                            and benefits of the procedure and the sedation                            options and risks were discussed with the patient.                            All questions were answered, and informed consent                            was obtained. Prior Anticoagulants: The patient has                            taken no previous anticoagulant or antiplatelet                            agents. ASA Grade Assessment: III - A patient with                            severe systemic disease. After reviewing the risks  and benefits, the patient was deemed in                            satisfactory condition to undergo the procedure.                           - Sedation was administered by an anesthesia                            professional. Deep sedation was attained.                           After obtaining informed consent, the scope was                            passed under  direct vision. The GIF-H190 ([id number])                            was introduced through the anus and advanced to the                            the ileo-rectal anastomosis. The flexible                            sigmoidoscopy was accomplished without difficulty.                            The patient tolerated the procedure well. The                            quality of the bowel preparation was adequate. Scope In: Scope Out: Findings:      A few small localized angiodysplastic lesions with bleeding were found       in the rectum. Coagulation for hemostasis using monopolar probe was       unsuccessful. To stop active bleeding, two hemostatic clips were       successfully placed (MR unsafe). There was no bleeding at the end of the       procedure.      There was evidence of a prior end-to-side ileo-rectal anastomosis in the       rectum. This was patent and was characterized by healthy appearing       mucosa. The anastomosis was traversed.      In the rectum there was evidence of a small vascular protrusion.       Manipulation of the endoscope over that area induced bleeding. The area       was ablated with APC, but persistent oozing occurred. Two hemoclips were       placed across the area and the bleeding was arrested. There was also       evidence of some other nonbleeding AVMs, but these were not treated as       she still had a large stool burden in the rectum. It was difficult to       see 40-50% of the circumfirence. Impression:               - A few bleeding colonic angiodysplastic lesions.  Treatment not successful. Treated with a monopolar                            probe. Clips (MR unsafe) were placed.                           - Patent end-to-side ileo-rectal anastomosis,                            characterized by healthy appearing mucosa.                           - No specimens collected. Moderate Sedation:      Not Applicable - Patient had  care per Anesthesia. Recommendation:           - Patient has a contact number available for                            emergencies. The signs and symptoms of potential                            delayed complications were discussed with the                            patient. Return to normal activities tomorrow.                            Written discharge instructions were provided to the                            patient.                           - Resume regular diet.                           - Return to GI clinic in 4 weeks.                           - If her anemia persists, a repeat FFS with APC                            will be performed. Procedure Code(s):        --- Professional ---                           [phone], Sigmoidoscopy, flexible; with control of                            bleeding, any method Diagnosis Code(s):        --- Professional ---                           K55.21, Angiodysplasia of colon with hemorrhage  Z98.0, Intestinal bypass and anastomosis status                           D50.9, Iron deficiency anemia, unspecified CPT copyright 2019 [employer]. All rights reserved. The codes documented in this report are preliminary and upon coder review may  be revised to meet current compliance requirements. Carol Ada, MD Carol Ada, MD 03/13/2020 11:48:41 AM This report has been signed electronically. Number of Addenda: 0

## 2020-03-13 NOTE — Transfer of Care (Signed)
Immediate Anesthesia Transfer of Care Note  Patient: Crystal [name]  Procedure(s) Performed: ESOPHAGOGASTRODUODENOSCOPY (EGD) WITH PROPOFOL (N/A ) FLEXIBLE SIGMOIDOSCOPY (N/A ) HOT HEMOSTASIS (ARGON PLASMA COAGULATION/BICAP) (N/A ) POLYPECTOMY HEMOSTASIS CLIP PLACEMENT  Patient Location: PACU  Anesthesia Type:MAC  Level of Consciousness: awake, alert  and oriented  Airway & Oxygen Therapy: Patient Spontanous Breathing and Patient connected to face mask oxygen  Post-op Assessment: Report given to RN, Post -op Vital signs reviewed and stable and Patient moving all extremities X 4  Post vital signs: Reviewed and stable  Last Vitals:  Vitals Value Taken Time  BP    Temp    Pulse    Resp    SpO2      Last Pain:  Vitals:   03/13/20 1023  TempSrc: Oral  PainSc: 0-No pain         Complications: No complications documented.

## 2020-03-13 NOTE — Discharge Instructions (Signed)

## 2020-03-13 NOTE — Anesthesia Procedure Notes (Signed)
Procedure Name: MAC Date/Time: 03/13/2020 11:09 AM Performed by: Niel Hummer, CRNA Pre-anesthesia Checklist: Patient identified, Emergency Drugs available, Suction available and Patient being monitored Oxygen Delivery Method: Simple face mask

## 2020-03-13 NOTE — Anesthesia Postprocedure Evaluation (Signed)
Anesthesia Post Note  Patient: Crystal [name]  Procedure(s) Performed: ESOPHAGOGASTRODUODENOSCOPY (EGD) WITH PROPOFOL (N/A ) FLEXIBLE SIGMOIDOSCOPY (N/A ) HOT HEMOSTASIS (ARGON PLASMA COAGULATION/BICAP) (N/A ) POLYPECTOMY HEMOSTASIS CLIP PLACEMENT     Patient location during evaluation: PACU Anesthesia Type: MAC Level of consciousness: awake and alert Pain management: pain level controlled Vital Signs Assessment: post-procedure vital signs reviewed and stable Respiratory status: spontaneous breathing, nonlabored ventilation, respiratory function stable and patient connected to nasal cannula oxygen Cardiovascular status: stable and blood pressure returned to baseline Postop Assessment: no apparent nausea or vomiting Anesthetic complications: no   No complications documented.  Last Vitals:  Vitals:   03/13/20 1150 03/13/20 1210  BP: (!) 107/41 (!) 116/48  Pulse: 74 78  Resp: 18 18  Temp:    SpO2: 100% 100%    Last Pain:  Vitals:   03/13/20 1210  TempSrc:   PainSc: 0-No pain                 Merlinda Frederick

## 2020-03-13 NOTE — Anesthesia Preprocedure Evaluation (Signed)
Anesthesia Evaluation  Patient identified by MRN, date of birth, ID band Patient awake    Reviewed: Allergy & Precautions, NPO status , Patient's Chart, lab work & pertinent test results, reviewed documented beta blocker date and time   History of Anesthesia Complications (+) PONVNegative for: history of anesthetic complications  Airway Mallampati: I  TM Distance: >3 FB Neck ROM: Full    Dental  (+) Edentulous Lower, Edentulous Upper   Pulmonary asthma , sleep apnea (2L qhs), Continuous Positive Airway Pressure Ventilation and Oxygen sleep apnea ,    Pulmonary exam normal        Cardiovascular hypertension, Pt. on home beta blockers and Pt. on medications +CHF (h/o NICM, EF recovered)  Normal cardiovascular exam+ pacemaker + Cardiac Defibrillator   ECHO 12/13/19:  1. Normal LV systolic function; grade 1 diastolic dysfunction; moderate  LVH; mild RVE.  2. Left ventricular ejection fraction, by estimation, is 55 to 60%. The  left ventricle has normal function. The left ventricle has no regional  wall motion abnormalities. There is moderate left ventricular hypertrophy.  Left ventricular diastolic  parameters are consistent with Grade I diastolic dysfunction (impaired  relaxation).  3. Right ventricular systolic function is normal. The right ventricular  size is mildly enlarged. There is normal pulmonary artery systolic  pressure.  4. The mitral valve is normal in structure. Trivial mitral valve  regurgitation. No evidence of mitral stenosis.  5. The aortic valve is tricuspid. Aortic valve regurgitation is not  visualized. Mild aortic valve sclerosis is present, with no evidence of  aortic valve stenosis.  6. The inferior vena cava is normal in size with greater than 50%  respiratory variability, suggesting right atrial pressure of 3 mmHg.   2019 cath- no significant CAD    Neuro/Psych PSYCHIATRIC DISORDERS Anxiety  Depression  Neuromuscular disease (diabetic neuropathy)    GI/Hepatic Neg liver ROS, GERD  ,  Endo/Other  diabetes, Poorly Controlled, Insulin DependentHypothyroidism Morbid obesity  Renal/GU Renal InsufficiencyRenal disease  negative genitourinary   Musculoskeletal  (+) Arthritis ,   Abdominal   Peds  Hematology  (+) Blood dyscrasia, anemia ,   Anesthesia Other Findings   Reproductive/Obstetrics                             Anesthesia Physical  Anesthesia Plan  ASA: III  Anesthesia Plan: MAC   Post-op Pain Management:    Induction: Intravenous  PONV Risk Score and Plan: 3 and Treatment may vary due to age or medical condition, Propofol infusion and TIVA  Airway Management Planned: Oral ETT  Additional Equipment: None  Intra-op Plan:   Post-operative Plan:   Informed Consent: I have reviewed the patients History and Physical, chart, labs and discussed the procedure including the risks, benefits and alternatives for the proposed anesthesia with the patient or authorized representative who has indicated his/her understanding and acceptance.     Dental advisory given  Plan Discussed with:   Anesthesia Plan Comments:         Anesthesia Quick Evaluation

## 2020-03-13 NOTE — Op Note (Addendum)
[facility] Patient Name: Crystal [name] Procedure Date: 03/13/2020 MRN: [id number] Attending MD: Carol Ada , MD Date of Birth: 03/27/1951 CSN: [id number] Age: 69 Admit Type: Outpatient Procedure:                Upper GI endoscopy Indications:              Iron deficiency anemia Providers:                Carol Ada, MD, Wynonia Sours, RN, Tyrone Apple, Technician, Maudry Diego, CRNA Referring MD:              Medicines:                Propofol per Anesthesia Complications:            No immediate complications. Estimated Blood Loss:     Estimated blood loss was minimal. Procedure:                Pre-Anesthesia Assessment:                           - Prior to the procedure, a History and Physical                            was performed, and patient medications and                            allergies were reviewed. The patient's tolerance of                            previous anesthesia was also reviewed. The risks                            and benefits of the procedure and the sedation                            options and risks were discussed with the patient.                            All questions were answered, and informed consent                            was obtained. Prior Anticoagulants: The patient has                            taken no previous anticoagulant or antiplatelet                            agents. ASA Grade Assessment: III - A patient with                            severe systemic disease. After reviewing the risks  and benefits, the patient was deemed in                            satisfactory condition to undergo the procedure.                           - Sedation was administered by an anesthesia                            professional. Deep sedation was attained.                           After obtaining informed consent, the endoscope was                            passed  under direct vision. Throughout the                            procedure, the patient's blood pressure, pulse, and                            oxygen saturations were monitored continuously. The                            GIF-H190 ([id number]) was introduced through the                            mouth, and advanced to the second part of duodenum.                            The upper GI endoscopy was accomplished without                            difficulty. The patient tolerated the procedure                            well. Scope In: Scope Out: Findings:      Diffuse, white plaques were found in the entire esophagus.      Multiple 5 mm sessile polyps with no bleeding and no stigmata of recent       bleeding were found in the gastric antrum. The polyp was removed with a       cold snare. Resection and retrieval were complete.      The examined duodenum was normal.      Two representative samples of the antral polyps were removed with a cold       snare. Impression:               - Normal esophagus.                           - Multiple gastric polyps. Resected and retrieved.                           - Normal examined duodenum. Moderate Sedation:      Not Applicable - Patient had care  per Anesthesia. Recommendation:           - Patient has a contact number available for                            emergencies. The signs and symptoms of potential                            delayed complications were discussed with the                            patient. Return to normal activities tomorrow.                            Written discharge instructions were provided to the                            patient.                           - Resume previous diet.                           - Continue present medications.                           - Await pathology results.                           - Return to GI clinic in 4 weeks.                           - Treat with fluconazole x 10  days. Procedure Code(s):        --- Professional ---                           [phone], Esophagogastroduodenoscopy, flexible,                            transoral; with removal of tumor(s), polyp(s), or                            other lesion(s) by snare technique Diagnosis Code(s):        --- Professional ---                           K31.7, Polyp of stomach and duodenum                           D50.9, Iron deficiency anemia, unspecified CPT copyright 2019 [employer]. All rights reserved. The codes documented in this report are preliminary and upon coder review may  be revised to meet current compliance requirements. Carol Ada, MD Carol Ada, MD 03/13/2020 11:41:23 AM This report has been signed electronically. Number of Addenda: 0

## 2020-03-13 NOTE — H&P (Signed)
Crystal [name] HPI: Two weeks ago she started to notice feeling more fatigued and her palms appear to be paler. Further work up did show that her HGB was in the 7 range (July 21st). Two units of blood were transfused and she felt better. The patient denies any issues with hematochezia or melena, but she was noted to be heme positive. Previously the patient underwent two successive FFS with dilation and then a stent placement. The stent fell out of place and she underwent a right colon resection with ileal rectal anastomosis. Her most recent HGB on 01/07/2020 was at 8.5 g/dL and he baseline is around 10-11 g/dL. She has Stage III CKD.    Past Medical History:  Diagnosis Date  . AICD (automatic cardioverter/defibrillator) present    high RV threshold chronically, device was turned off in 2014; Device battery has been dead x 7 years; "turned it back on 06/22/2018"  . Anemia, iron deficiency   . Angioedema    felt to likely be due to ace inhibitors but says she has had this even off of medicines, appears to be tolerating ARBs chronically  . Anxiety   . Arthritis    "hands, legs, arms; bad in my back" (06/22/2018)  . Asthmatic bronchitis with status asthmaticus   . Bradycardia   . CHF (congestive heart failure) ([place])   . Chronic lower back pain   . Chronic pain   . Chronic renal insufficiency   . CKD (chronic kidney disease), stage III   . Coronary artery disease    Mild nonobstructive (30% LAD, 30% RCA) by 09/01/16 cath at [facility]  . Degenerative joint disease   . Depression   . Diabetes mellitus, type 2 ([place])   . Diabetic peripheral neuropathy ([place])   . Dizziness   . Dyspnea on exertion   . Family history of adverse reaction to anesthesia    Mother has nausea  . GERD (gastroesophageal reflux disease)   . Gout   . Heart valve disorder   . History of blood transfusion 1981; ~ 2005; 12/2016   "childbirth; defibrillator OR; colostomy OR"  . History of mononucleosis  03/2014  . Hyperlipidemia   . Hypertension   . Hypothyroid   . Insomnia   . Intervertebral disc degeneration   . Ischemic cardiomyopathy   . LBBB (left bundle branch block)   . Myocardial infarction ([place]) dx'd ~ 2005  . Nonischemic cardiomyopathy ([place])    s/p ICD in 2005. EF has since recovered  . Obesity   . On home oxygen therapy    "2L at night" (06/22/2018)  . OSA on CPAP   . Pneumonia    "couple times; last time was 12/2016" (06/22/2018)  . PONV (postoperative nausea and vomiting)   . Presence of permanent cardiac pacemaker    [employer]  . Swelling   . Syncope   . Systemic hypertension   . Vitamin D deficiency     Past Surgical History:  Procedure Laterality Date  . APPENDECTOMY  07/13/2017  . BLADDER SUSPENSION  1990   "w/hysterectomy"  . BOWEL RESECTION N/A 07/09/2018   Procedure: SMALL BOWEL RESECTION;  Surgeon: Judeth Horn, MD;  Location: [place];  Service: General;  Laterality: N/A;  . CARDIAC CATHETERIZATION  2005   no obstructive CAD per patient  . CARDIAC CATHETERIZATION  2018  . CARDIAC DEFIBRILLATOR PLACEMENT  2005   BiV ICD implanted,  LV lead is an epicardial lead  . CARPAL TUNNEL RELEASE Left 07/25/2014  Dr.Williamson   . CARPAL TUNNEL RELEASE Right 04/08/2019   Procedure: RIGHT CARPAL TUNNEL RELEASE ENDOSCOPIC;  Surgeon: Milly Jakob, MD;  Location: [place];  Service: Orthopedics;  Laterality: Right;  . CATARACT EXTRACTION W/ INTRAOCULAR LENS  IMPLANT, BILATERAL Bilateral   . COLONIC STENT PLACEMENT N/A 08/31/2017   Procedure: COLONIC STENT PLACEMENT;  Surgeon: Carol Ada, MD;  Location: [place];  Service: Endoscopy;  Laterality: N/A;  . COLONOSCOPY     2010-2011 Dr.Kipreos   . COLOSTOMY  12/2016   Archie Endo 01/26/2017  . COLOSTOMY REVERSAL N/A 07/13/2017   Procedure: COLOSTOMY REVERSAL;  Surgeon: Judeth Horn, MD;  Location: [place];  Service: General;  Laterality: N/A;  . CYST REMOVAL HAND Right 05/2013   thumb  . DILATION AND CURETTAGE  OF UTERUS  X 5-6  . EXCISION MASS ABDOMINAL N/A 07/09/2018   Procedure: EXPLORATION OF  ABDOMINAL WOUND ERAS PATHWAY;  Surgeon: Judeth Horn, MD;  Location: [place];  Service: General;  Laterality: N/A;  . FLEXIBLE SIGMOIDOSCOPY N/A 08/24/2017   Procedure: Beryle Quant;  Surgeon: Carol Ada, MD;  Location: [place];  Service: Endoscopy;  Laterality: N/A;  . FLEXIBLE SIGMOIDOSCOPY N/A 08/31/2017   Procedure: FLEXIBLE SIGMOIDOSCOPY;  Surgeon: Carol Ada, MD;  Location: [place];  Service: Endoscopy;  Laterality: N/A;  stent placement  . IMPLANTABLE CARDIOVERTER DEFIBRILLATOR GENERATOR CHANGE  2008  . INSERTION OF MESH N/A 07/09/2018   Procedure: INSERTION OF VICRYL MESH;  Surgeon: Judeth Horn, MD;  Location: [place];  Service: General;  Laterality: N/A;  . LAPAROTOMY N/A 09/18/2017   Procedure: EXPLORATORY LAPAROTOMY;  Surgeon: Judeth Horn, MD;  Location: [place];  Service: General;  Laterality: N/A;  . LEAD REVISION  06/22/2018  . LEAD REVISION/REPAIR N/A 06/22/2018   Procedure: LEAD REVISION/REPAIR;  Surgeon: Deboraha Sprang, MD;  Location: [place] [facility];  Service: Cardiovascular;  Laterality: N/A;  . LYSIS OF ADHESION N/A 09/18/2017   Procedure: LYSIS OF ADHESION;  Surgeon: Judeth Horn, MD;  Location: [place];  Service: General;  Laterality: N/A;  . LYSIS OF ADHESION N/A 07/09/2018   Procedure: LYSIS OF ADHESION;  Surgeon: Judeth Horn, MD;  Location: [place];  Service: General;  Laterality: N/A;  . PARTIAL COLECTOMY N/A 09/18/2017   Procedure: ILEOCOLECTOMY;  Surgeon: Judeth Horn, MD;  Location: [place];  Service: General;  Laterality: N/A;  . PARTIAL COLECTOMY  09/25/2017  . RIGHT/LEFT HEART CATH AND CORONARY ANGIOGRAPHY N/A 06/01/2018   Procedure: RIGHT/LEFT HEART CATH AND CORONARY ANGIOGRAPHY;  Surgeon: Jettie Booze, MD;  Location: [place] [facility];  Service: Cardiovascular;  Laterality: N/A;  . SIGMOIDOSCOPY N/A 09/18/2017   Procedure: SIGMOIDOSCOPY;  Surgeon:  Judeth Horn, MD;  Location: [place];  Service: General;  Laterality: N/A;  . SMALL INTESTINE SURGERY  07/09/2018   EXPLORATION OF ABDOMINAL WOUND ERAS PATHWAYN/; MESH; LYSIS OF ADHESIONS  . TOTAL ABDOMINAL HYSTERECTOMY  1990  . TRANSFORAMINAL LUMBAR INTERBODY FUSION (TLIF) WITH PEDICLE SCREW FIXATION 2 LEVEL N/A 01/07/2020   Procedure: Lumbar Four-Five and Lumbar Five-Sacral One open lumbar decompression and transforaminal lumbar interbody fusion;  Surgeon: Judith Part, MD;  Location: [place];  Service: Neurosurgery;  Laterality: N/A;  Lumbar Four-Five and Lumbar Five-Sacral One open lumbar decompression and transforaminal lumbar interbody fusion  . TRIGGER FINGER RELEASE Right 05/213  . TRIGGER FINGER RELEASE Right 04/08/2019   Procedure: RIGHT INDEX RELEASE TRIGGER FINGER/A-1 PULLEY;  Surgeon: Milly Jakob, MD;  Location: [place];  Service: Orthopedics;  Laterality: Right;  .  TUBAL LIGATION  1980s  . VESICOVAGINAL FISTULA CLOSURE W/ TAH      Family History  Problem Relation Age of Onset  . Hypertension Father   . Heart disease Father   . Cancer Father        prostate  . Heart disease Other        (Maternal side) Ischemic heart disease  . Diabetes Mellitus II Other   . Arrhythmia Mother   . Diabetes Mellitus II Mother        Borderline DM  . Hypertension Mother   . Asthma Mother   . Cancer Brother   . Dementia Brother   . Hypertension Brother   . Hypertension Daughter   . Hypertension Daughter   . Diabetes Daughter   . Hypertension Daughter   . Atrial fibrillation Daughter   . GER disease Daughter   . Hypertension Son   . Anxiety disorder Son   . Hypothyroidism Son   . Breast cancer Maternal Grandmother     Social History:  reports that she has never smoked. She has never used smokeless tobacco. She reports that she does not drink alcohol and does not use drugs.  Allergies:  Allergies  Allergen Reactions  . Ace Inhibitors Swelling and Other (See Comments)     Angioedema  . Glucophage [Metformin Hcl] Other (See Comments)    Renal failure  . Telmisartan Other (See Comments)    AVOID ARB/ ACEi in this patient due to recurrent AKI  . Advicor [Niacin-Lovastatin Er] Other (See Comments)    Muscle aches  . Bystolic [Nebivolol Hcl] Swelling    Whole body swelled  . Erythromycin Diarrhea, Nausea And Vomiting and Other (See Comments)    *DERIVATIVES*  . Lipitor [Atorvastatin] Other (See Comments)    Muscle aches  . Lopid [Gemfibrozil] Other (See Comments)    Muscle aches  . Statins Other (See Comments)    Muscle aches tolerates crestor    Medications:  Scheduled:  Continuous: . sodium chloride      Results for orders placed or performed during the hospital encounter of 03/13/20 (from the past 24 hour(s))  Glucose, capillary     Status: Abnormal   Collection Time: 03/13/20 10:29 AM  Result Value Ref Range   Glucose-Capillary 289 (H) 70 - 99 mg/dL     No results found.  ROS:  As stated above in the HPI otherwise negative.  Blood pressure (!) 128/50, temperature 98.5 F (36.9 C), temperature source Oral, resp. rate 14, height 5\' 4"  (1.626 m), weight 105.7 kg, SpO2 97 %.    PE: Gen: NAD, Alert and Oriented HEENT:  [place]/AT, EOMI Neck: Supple, no LAD Lungs: CTA Bilaterally CV: RRR without M/G/R ABD: Soft, NTND, +BS Ext: No C/C/E  Assessment/Plan: 1) IDA - With the drop in her HGB and the finding of heme positive stool, she will undergo endoscopic evaluation. An EGD/FFS will be performed. If it is negative, the most likely source of the anemia is from her renal disease. Preoperative work up for her back was obtained from Dr. Irish Lack, her cardiologist. She was cleared for the surgery with an echo and other routine work up.    Kaleea Penner D 03/13/2020, 11:36 AM

## 2020-03-13 NOTE — Progress Notes (Signed)
Call placed to Dr. Elgie Congo to report CBG 289 today. No new orders at this time.

## 2020-03-16 LAB — SURGICAL PATHOLOGY

## 2020-03-18 DIAGNOSIS — H40019 Open angle with borderline findings, low risk, unspecified eye: Secondary | ICD-10-CM | POA: Diagnosis not present

## 2020-03-24 ENCOUNTER — Other Ambulatory Visit: Payer: Self-pay

## 2020-03-24 ENCOUNTER — Telehealth: Payer: Self-pay | Admitting: Podiatry

## 2020-03-24 ENCOUNTER — Telehealth (INDEPENDENT_AMBULATORY_CARE_PROVIDER_SITE_OTHER): Payer: Medicare Other | Admitting: Podiatry

## 2020-03-24 ENCOUNTER — Encounter: Payer: Self-pay | Admitting: Podiatrist

## 2020-03-24 ENCOUNTER — Ambulatory Visit (INDEPENDENT_AMBULATORY_CARE_PROVIDER_SITE_OTHER): Payer: Medicare Other | Admitting: Podiatrist

## 2020-03-24 VITALS — BP 119/61 | HR 76

## 2020-03-24 DIAGNOSIS — L03031 Cellulitis of right toe: Secondary | ICD-10-CM | POA: Diagnosis not present

## 2020-03-24 MED ORDER — NEOMYCIN-POLYMYXIN-HC 3.5-10000-1 OT SOLN: OTIC | 1 refills | Status: DC

## 2020-03-24 NOTE — Progress Notes (Signed)
Chief Complaint  Patient presents with  . Nail Problem    thick painful toenails, feels like they are ingrowing  . Diabetes    A1C  9.1  . Peripheral Neuropathy  . Chronic Kidney Disease     HPI: Patient is 69 y.o. female who presents today for the concerns as listed above.  She relates she has pain on both borders of the right hallux and at times pain in the left great toe.  The left great toenail is OK at todays visit.   She relates she is diabetic and her blood sugar is running higher than normal as of late.    Patient Active Problem List   Diagnosis Date Noted  . Lumbar stenosis with neurogenic claudication 01/07/2020  . Generalized abdominal pain   . CKD (chronic kidney disease) stage 4, GFR 15-29 ml/min (HCC)   . SBO (small bowel obstruction) ([place]) 08/17/2019  . Hyponatremia 08/17/2019  . COVID-19 virus infection 08/17/2019  . Acute lower UTI 08/17/2019  . Prolonged QT interval 08/17/2019  . Acute hypoxemic respiratory failure due to COVID-19 ([place]) 08/07/2019  . Normocytic anemia 08/07/2019  . 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 Congestive heart failure (CHF) ([place]) 06/22/2018  . Acute systolic heart failure ([place])   . Type 2 diabetes mellitus with stage 3 chronic kidney disease, with long-term current use of insulin ([place]) 11/22/2017  . Acute respiratory failure with hypoxia ([place]) 11/22/2017  . Bowel obstruction ([employer] [employer]) 08/23/2017  . Colonic obstruction ([place]) 08/22/2017  . Dehydration   . Intractable vomiting with nausea   . Hyperkalemia   . Gastroenteritis 08/11/2017  . Hypotension 08/11/2017  . Acute renal failure superimposed on stage 3 chronic kidney disease ([place]) 08/11/2017  . Hypovolemia 08/10/2017  . Colostomy in place [facility]) 07/13/2017  . Adjustment disorder with depressed mood   . Debilitated 02/06/2017  . S/P colostomy ([place]) 01/31/2017  . Pressure injury of skin 01/28/2017  . Colitis 01/26/2017  . Acute MI ([place]) 05/05/2014  . Anemia, iron deficiency  05/05/2014  . Airway hyperreactivity 05/05/2014  . Diabetes mellitus, type 2 ([place]) 05/05/2014  . Chronic systolic heart failure ([place]) 05/05/2014  . HLD (hyperlipidemia) 05/05/2014  . Disease of thyroid gland 05/05/2014  . Nonischemic cardiomyopathy ([place]) 02/02/2014  . LBBB (left bundle branch block) 02/02/2014  . Chronic systolic dysfunction of left ventricle 02/02/2014  . Chronic renal insufficiency 02/02/2014  . Essential hypertension 02/02/2014  . Morbid obesity ([place]) 02/02/2014  . Coronary artery disease 08/15/2013  . Shock ([place]) 08/15/2013  . History of prolonged Q-T interval on ECG 10/31/2012  . Hx of cardiomyopathy 10/31/2012  . Automatic implantable cardioverter-defibrillator in situ 08/06/2008    Current Outpatient Medications on File Prior to Visit  Medication Sig Dispense Refill  . albuterol (PROVENTIL HFA;VENTOLIN HFA) 108 (90 Base) MCG/ACT inhaler Inhale 2 puffs into the lungs every 6 (six) hours as needed for wheezing or shortness of breath.     Marland Kitchen CALCIUM-VITAMIN D PO Take 600 mg by mouth daily.    . carvedilol (COREG) 25 MG tablet Take 25 mg by mouth 2 (two) times daily.     . CRESTOR 10 MG tablet Take 1 tablet (10 mg total) by mouth at bedtime. 30 tablet 0  . D3-50 1.25 MG (50000 UT) capsule Take 50,000 Units by mouth once a week.    . DULoxetine (CYMBALTA) 60 MG capsule Take 60 mg by mouth daily.    . febuxostat (ULORIC) 40 MG tablet Take 40 mg by mouth daily.    Marland Kitchen  FERREX 150 150 MG capsule Take 150 mg by mouth daily.    . fluconazole (DIFLUCAN) 100 MG tablet     . gabapentin (NEURONTIN) 300 MG capsule Take 900 mg by mouth at bedtime.     . hydrALAZINE (APRESOLINE) 25 MG tablet Take 1 tablet (25 mg total) by mouth 3 (three) times daily. 270 tablet 3  . insulin regular human CONCENTRATED (HUMULIN R U-500 KWIKPEN) 500 UNIT/ML kwikpen Inject 120 Units into the skin 3 (three) times daily with meals.     Marland Kitchen levothyroxine (SYNTHROID, LEVOTHROID) 88 MCG tablet Take 88 mcg by  mouth daily before breakfast.     . loratadine (CLARITIN) 10 MG tablet Take 10 mg by mouth daily.    . montelukast (SINGULAIR) 10 MG tablet Take 10 mg by mouth at bedtime.    Marland Kitchen omega-3 acid ethyl esters (LOVAZA) 1 g capsule Take 4 g by mouth at bedtime.     . pantoprazole (PROTONIX) 40 MG tablet Take 40 mg by mouth 2 (two) times daily.   2  . PRESCRIPTION MEDICATION Inhale into the lungs at bedtime. CPAP with  2 liter oxygen    . temazepam (RESTORIL) 15 MG capsule Take 1 capsule (15 mg total) by mouth at bedtime as needed for sleep. 30 capsule 0  . torsemide (DEMADEX) 20 MG tablet Take 20 mg by mouth daily.      No current facility-administered medications on file prior to visit.    Allergies  Allergen Reactions  . Ace Inhibitors Swelling and Other (See Comments)    Angioedema  . Glucophage [Metformin Hcl] Other (See Comments)    Renal failure  . Telmisartan Other (See Comments)    AVOID ARB/ ACEi in this patient due to recurrent AKI  . Advicor [Niacin-Lovastatin Er] Other (See Comments)    Muscle aches  . Bystolic [Nebivolol Hcl] Swelling    Whole body swelled  . Erythromycin Diarrhea, Nausea And Vomiting and Other (See Comments)    *DERIVATIVES*  . Lipitor [Atorvastatin] Other (See Comments)    Muscle aches  . Lopid [Gemfibrozil] Other (See Comments)    Muscle aches  . Statins Other (See Comments)    Muscle aches tolerates crestor    Review of Systems No fevers, chills, nausea, muscle aches, no difficulty breathing, no calf pain, no chest pain or shortness of breath.   Physical Exam  GENERAL APPEARANCE: Alert, conversant. Appropriately groomed. No acute distress.   VASCULAR: Pedal pulses palpable DP and PT bilateral.  Capillary refill time is immediate to all digits,  Proximal to distal cooling it warm to warm.  Digital perfusion adequate.   NEUROLOGIC: sensation is intact to 5.07 monofilament at 5/5 sites bilateral.  Light touch is intact bilateral, vibratory sensation  intact bilateral  MUSCULOSKELETAL: acceptable muscle strength, tone and stability bilateral.  Hammertoe contracture right second toe noted.   No pain, crepitus or limitation noted with foot and ankle range of motion bilateral.   DERMATOLOGIC: skin is warm, supple, and dry.  No open lesions noted.  No rash, no pre ulcerative lesions.Right hallux nail is ingrown on the medial and lateral sides. No pus or drainage noted.  Redness and pain along the borders is noted.   Assessment     ICD-10-CM   1. Paronychia of great toe, right  L03.031      Plan  Treatment options and alternatives discussed.  Recommended an incision and drainage and patient agreed.  Right hallux was prepped with alcohol and a 1 to  1 mix of 0.5% marcaine plain and 2% lidocaine plain was administered in a digital block fashion.  A betadine  prep was perform and digit exsanguinated.  The offending nail border (medial and lateral side) was then excised from distal nail - proximally.  The area was then cleansed well with betadine and alcohol and antibiotic ointment and a dry sterile dressing was applied.  The patient was dispensed instructions for aftercare. She will return in a week for a nail check and will call sooner if any problems arise.

## 2020-03-24 NOTE — Patient Instructions (Addendum)
Soak Instructions    THE DAY AFTER THE PROCEDURE  Place 1/4 cup of epsom salts in a quart of warm tap water.  Submerge your foot or feet with outer bandage intact for the initial soak; this will allow the bandage to become moist and wet for easy lift off.  Once you remove your bandage, continue to soak in the solution for 20 minutes.  This soak should be done twice a day.  Next, remove your foot or feet from solution, blot dry the affected area and cover.  Apply polysporin or neosporin.   You may use a band aid large enough to cover the area or use gauze and tape.     IF YOUR SKIN BECOMES IRRITATED WHILE USING THESE INSTRUCTIONS, IT IS OKAY TO SWITCH TO  antibacterial soap pump soap (Dial)  and water to keep the toe clean instead of soaking in epsom salts.     Diabetes Mellitus and Foot Care Foot care is an important part of your health, especially when you have diabetes. Diabetes may cause you to have problems because of poor blood flow (circulation) to your feet and legs, which can cause your skin to:  Become thinner and drier.  Break more easily.  Heal more slowly.  Peel and crack. You may also have nerve damage (neuropathy) in your legs and feet, causing decreased feeling in them. This means that you may not notice minor injuries to your feet that could lead to more serious problems. Noticing and addressing any potential problems early is the best way to prevent future foot problems. How to care for your feet Foot hygiene  Wash your feet daily with warm water and mild soap. Do not use hot water. Then, pat your feet and the areas between your toes until they are completely dry. Do not soak your feet as this can dry your skin.  Trim your toenails straight across. Do not dig under them or around the cuticle. File the edges of your nails with an emery board or nail file.  Apply a moisturizing lotion or petroleum jelly to the skin on your feet and to dry, brittle toenails. Use  lotion that does not contain alcohol and is unscented. Do not apply lotion between your toes. Shoes and socks  Wear clean socks or stockings every day. Make sure they are not too tight. Do not wear knee-high stockings since they may decrease blood flow to your legs.  Wear shoes that fit properly and have enough cushioning. Always look in your shoes before you put them on to be sure there are no objects inside.  To break in new shoes, wear them for just a few hours a day. This prevents injuries on your feet. Wounds, scrapes, corns, and calluses  Check your feet daily for blisters, cuts, bruises, sores, and redness. If you cannot see the bottom of your feet, use a mirror or ask someone for help.  Do not cut corns or calluses or try to remove them with medicine.  If you find a minor scrape, cut, or break in the skin on your feet, keep it and the skin around it clean and dry. You may clean these areas with mild soap and water. Do not clean the area with peroxide, alcohol, or iodine.  If you have a wound, scrape, corn, or callus on your foot, look at it several times a day to make sure it is healing and not infected. Check for: ? Redness, swelling, or pain. ? Fluid  or blood. ? Warmth. ? Pus or a bad smell. General instructions  Do not cross your legs. This may decrease blood flow to your feet.  Do not use heating pads or hot water bottles on your feet. They may burn your skin. If you have lost feeling in your feet or legs, you may not know this is happening until it is too late.  Protect your feet from hot and cold by wearing shoes, such as at the beach or on hot pavement.  Schedule a complete foot exam at least once a year (annually) or more often if you have foot problems. If you have foot problems, report any cuts, sores, or bruises to your health care provider immediately. Contact a health care provider if:  You have a medical condition that increases your risk of infection and you  have any cuts, sores, or bruises on your feet.  You have an injury that is not healing.  You have redness on your legs or feet.  You feel burning or tingling in your legs or feet.  You have pain or cramps in your legs and feet.  Your legs or feet are numb.  Your feet always feel cold.  You have pain around a toenail. Get help right away if:  You have a wound, scrape, corn, or callus on your foot and: ? You have pain, swelling, or redness that gets worse. ? You have fluid or blood coming from the wound, scrape, corn, or callus. ? Your wound, scrape, corn, or callus feels warm to the touch. ? You have pus or a bad smell coming from the wound, scrape, corn, or callus. ? You have a fever. ? You have a red line going up your leg. Summary  Check your feet every day for cuts, sores, red spots, swelling, and blisters.  Moisturize feet and legs daily.  Wear shoes that fit properly and have enough cushioning.  If you have foot problems, report any cuts, sores, or bruises to your health care provider immediately.  Schedule a complete foot exam at least once a year (annually) or more often if you have foot problems. This information is not intended to replace advice given to you by your health care provider. Make sure you discuss any questions you have with your health care provider. Document Revised: 04/03/2019 Document Reviewed: 08/12/2016 Elsevier Patient Education  [place].

## 2020-03-25 NOTE — Telephone Encounter (Signed)
Thank you!  I called and spoke with Crystal [name] and had her use polysporin instead of the drops.  Thanks for letting me know!

## 2020-03-26 NOTE — Telephone Encounter (Signed)
Encounter in error

## 2020-03-27 ENCOUNTER — Ambulatory Visit (INDEPENDENT_AMBULATORY_CARE_PROVIDER_SITE_OTHER): Payer: Medicare Other | Admitting: *Deleted

## 2020-03-27 DIAGNOSIS — I428 Other cardiomyopathies: Secondary | ICD-10-CM

## 2020-03-29 LAB — CUP PACEART REMOTE DEVICE CHECK
Battery Remaining Longevity: 84 mo
Battery Remaining Percentage: 98 %
Brady Statistic RA Percent Paced: 0 %
Brady Statistic RV Percent Paced: 99 %
Date Time Interrogation Session: 20210903020100
HighPow Impedance: 74 Ohm
Implantable Lead Implant Date: 20191129
Implantable Lead Implant Date: 20191129
Implantable Lead Implant Date: 20191129
Implantable Lead Location: 753858
Implantable Lead Location: 753859
Implantable Lead Location: 753860
Implantable Lead Model: 293
Implantable Lead Model: 4086
Implantable Lead Serial Number: 226691
Implantable Lead Serial Number: 440868
Implantable Pulse Generator Implant Date: 20191129
Lead Channel Impedance Value: 484 Ohm
Lead Channel Impedance Value: 612 Ohm
Lead Channel Impedance Value: 839 Ohm
Lead Channel Setting Pacing Amplitude: 2 V
Lead Channel Setting Pacing Amplitude: 2.5 V
Lead Channel Setting Pacing Amplitude: 3 V
Lead Channel Setting Pacing Pulse Width: 0.4 ms
Lead Channel Setting Pacing Pulse Width: 1 ms
Lead Channel Setting Sensing Sensitivity: 0.5 mV
Lead Channel Setting Sensing Sensitivity: 1 mV
Pulse Gen Serial Number: 227627

## 2020-04-01 NOTE — Progress Notes (Signed)
Remote ICD transmission.   

## 2020-04-02 ENCOUNTER — Ambulatory Visit: Payer: Medicare Other | Admitting: Podiatry

## 2020-04-07 DIAGNOSIS — Z79899 Other long term (current) drug therapy: Secondary | ICD-10-CM | POA: Diagnosis not present

## 2020-04-07 DIAGNOSIS — Z23 Encounter for immunization: Secondary | ICD-10-CM | POA: Diagnosis not present

## 2020-04-07 DIAGNOSIS — F329 Major depressive disorder, single episode, unspecified: Secondary | ICD-10-CM | POA: Diagnosis not present

## 2020-04-07 DIAGNOSIS — K219 Gastro-esophageal reflux disease without esophagitis: Secondary | ICD-10-CM | POA: Diagnosis not present

## 2020-04-07 DIAGNOSIS — I509 Heart failure, unspecified: Secondary | ICD-10-CM | POA: Diagnosis not present

## 2020-04-07 DIAGNOSIS — E1129 Type 2 diabetes mellitus with other diabetic kidney complication: Secondary | ICD-10-CM | POA: Diagnosis not present

## 2020-04-07 DIAGNOSIS — D62 Acute posthemorrhagic anemia: Secondary | ICD-10-CM | POA: Diagnosis not present

## 2020-04-07 DIAGNOSIS — N184 Chronic kidney disease, stage 4 (severe): Secondary | ICD-10-CM | POA: Diagnosis not present

## 2020-04-07 DIAGNOSIS — G4733 Obstructive sleep apnea (adult) (pediatric): Secondary | ICD-10-CM | POA: Diagnosis not present

## 2020-04-07 DIAGNOSIS — I10 Essential (primary) hypertension: Secondary | ICD-10-CM | POA: Diagnosis not present

## 2020-04-07 DIAGNOSIS — G609 Hereditary and idiopathic neuropathy, unspecified: Secondary | ICD-10-CM | POA: Diagnosis not present

## 2020-04-07 DIAGNOSIS — D649 Anemia, unspecified: Secondary | ICD-10-CM | POA: Diagnosis not present

## 2020-04-08 DIAGNOSIS — R195 Other fecal abnormalities: Secondary | ICD-10-CM | POA: Diagnosis not present

## 2020-04-08 DIAGNOSIS — D509 Iron deficiency anemia, unspecified: Secondary | ICD-10-CM | POA: Diagnosis not present

## 2020-04-23 DIAGNOSIS — M25561 Pain in right knee: Secondary | ICD-10-CM | POA: Diagnosis not present

## 2020-04-23 DIAGNOSIS — M170 Bilateral primary osteoarthritis of knee: Secondary | ICD-10-CM | POA: Diagnosis not present

## 2020-04-23 DIAGNOSIS — M25562 Pain in left knee: Secondary | ICD-10-CM | POA: Diagnosis not present

## 2020-04-30 DIAGNOSIS — M170 Bilateral primary osteoarthritis of knee: Secondary | ICD-10-CM | POA: Diagnosis not present

## 2020-05-01 DIAGNOSIS — E1129 Type 2 diabetes mellitus with other diabetic kidney complication: Secondary | ICD-10-CM | POA: Diagnosis not present

## 2020-05-07 DIAGNOSIS — M170 Bilateral primary osteoarthritis of knee: Secondary | ICD-10-CM | POA: Diagnosis not present

## 2020-06-06 NOTE — Progress Notes (Signed)
Cardiology Office Note   Date:  06/08/2020   ID:  [name], DOB 21-Aug-1950, MRN [id number]  PCP:  Crystal Infante, MD    No chief complaint on file.  NICM  Wt Readings from Last 3 Encounters:  06/08/20 242 lb (109.8 kg)  03/13/20 233 lb (105.7 kg)  01/07/20 231 lb (104.8 kg)       History of Present Illness: Crystal [name] is a 69 y.o. female  With a h/o, per the old chart,: "Chronic systolic heart failure  - long hx of NICM dating back to 2005, with reported LVEF at that time 24% - cath at that time showed no significant CAD.  - She has a chronic LBBB. Had a BiV AICD placed previously with epicardial LV lead with subsequent normalization of LVEF - LVEF improved with BiV pacing to >55%.  - from notes device reached ERI, her RV lead has increasing thresholds around that time. It was decided not to replace the device since normal LVEF and follow her clinically.   - repeat echo 04/2014 LVEF 50-55% - questionable hx of angioedema on ACE-Is, she states has been on ARBs and has tolerated."  She has CKD as well.  She had echo, cath in 2018 in [place], [place]. No CADand EF 50% per her report.   SHe has had other medical problems. She had a colostomy for colitis. It has been reversed.   She has been gaining weight since that time. She has gained 30 lbs.  SHe had carpal tunnel surgery.  SHe has been followed by Dr. Caryl Comes as well. 11/19 cath showed:  Prox LAD lesion is 25% stenosed.  Ost 1st Diag lesion is 25% stenosed.  Mid LM lesion is 10% stenosed.  LV end diastolic pressure is mildly elevated. LVEDP 17 mm Hg.  There is no aortic valve stenosis.  Hemodynamic findings consistent with mild pulmonary hypertension.  Ao sat 98%, PA sat 66%, CO 4.79 L/min; CI 2.2, mean PA 30 mm Hg; mean PCWP 22 mm Hg  No significant CAD.   Medical therapy for LV dysfunction.  Echo in 09/2018 showed: The left ventricle has severely reduced systolic  function, with an ejection fraction of 25-30%. The cavity size was moderately dilated. There is mildly increased left ventricular wall thickness. Left ventricular diastolic Doppler parameters are  consistent with impaired relaxation. Left ventricular diffuse hypokinesis. 2. The right ventricle has normal systolic function. The cavity was normal. There is no increase in right ventricular wall thickness. 3. Mild thickening of the mitral valve leaflet. There is mild mitral annular calcification present. 4. The tricuspid valve is grossly normal. 5. The aortic valve is tricuspid Mild thickening of the aortic valve. 6. There is mild dilatation of the aortic root and of the ascending aorta measuring 39 mm. 7. Severe global reduction in LV systolic function; mild diastolic dysfunction; moderate LVE; mild LVH; mild MR.  Had AKI with increased diuretic.  She was hospitalized with COVID inJan 2021. Treated with Decadron and remdesivir.  She is now limited by back pain and had surgery in 5/21 for sciatica.  That went well.  She got her COVID vaccines.   Since the last visit, she has done well.  She fels that she has recovered from [place].   Denies : Chest pain. Dizziness. Leg edema. Nitroglycerin use. Orthopnea. Palpitations. Paroxysmal nocturnal dyspnea. Syncope.   Most limited by arthritis.  Getting shots in her left knee.    Did have some anemia since the  last visit, received 2 transfusions, but improved with IV iron.      Past Medical History:  Diagnosis Date  . AICD (automatic cardioverter/defibrillator) present    high RV threshold chronically, device was turned off in 2014; Device battery has been dead x 7 years; "turned it back on 06/22/2018"  . Anemia, iron deficiency   . Angioedema    felt to likely be due to ace inhibitors but says she has had this even off of medicines, appears to be tolerating ARBs chronically  . Anxiety   . Arthritis    "hands, legs, arms; bad in my  back" (06/22/2018)  . Asthmatic bronchitis with status asthmaticus   . Bradycardia   . CHF (congestive heart failure) ([place])   . Chronic lower back pain   . Chronic pain   . Chronic renal insufficiency   . CKD (chronic kidney disease), stage III ([place])   . Coronary artery disease    Mild nonobstructive (30% LAD, 30% RCA) by 09/01/16 [facility]  . Degenerative joint disease   . Depression   . Diabetes mellitus, type 2 ([place])   . Diabetic peripheral neuropathy ([place])   . Dizziness   . Dyspnea on exertion   . Family history of adverse reaction to anesthesia    Mother has nausea  . GERD (gastroesophageal reflux disease)   . Gout   . Heart valve disorder   . History of blood transfusion 1981; ~ 2005; 12/2016   "childbirth; defibrillator OR; colostomy OR"  . History of mononucleosis 03/2014  . Hyperlipidemia   . Hypertension   . Hypothyroid   . Insomnia   . Intervertebral disc degeneration   . Ischemic cardiomyopathy   . LBBB (left bundle branch block)   . Myocardial infarction ([place]) dx'd ~ 2005  . Nonischemic cardiomyopathy ([place])    s/p ICD in 2005. EF has since recovered  . Obesity   . On home oxygen therapy    "2L at night" (06/22/2018)  . OSA on CPAP   . Pneumonia    "couple times; last time was 12/2016" (06/22/2018)  . PONV (postoperative nausea and vomiting)   . Presence of permanent cardiac pacemaker    [employer]  . Swelling   . Syncope   . Systemic hypertension   . Vitamin D deficiency     Past Surgical History:  Procedure Laterality Date  . APPENDECTOMY  07/13/2017  . BLADDER SUSPENSION  1990   "w/hysterectomy"  . BOWEL RESECTION N/A 07/09/2018   Procedure: SMALL BOWEL RESECTION;  Surgeon: Judeth Horn, MD;  Location: [facility];  Service: General;  Laterality: N/A;  . CARDIAC CATHETERIZATION  2005   no obstructive CAD per patient  . CARDIAC CATHETERIZATION  2018  . CARDIAC DEFIBRILLATOR PLACEMENT  2005   BiV ICD implanted,  LV lead is an  epicardial lead  . CARPAL TUNNEL RELEASE Left 07/25/2014   Dr.Williamson   . CARPAL TUNNEL RELEASE Right 04/08/2019   Procedure: RIGHT CARPAL TUNNEL RELEASE ENDOSCOPIC;  Surgeon: Milly Jakob, MD;  Location: [place];  Service: Orthopedics;  Laterality: Right;  . CATARACT EXTRACTION W/ INTRAOCULAR LENS  IMPLANT, BILATERAL Bilateral   . COLONIC STENT PLACEMENT N/A 08/31/2017   Procedure: COLONIC STENT PLACEMENT;  Surgeon: Carol Ada, MD;  Location: [place];  Service: Endoscopy;  Laterality: N/A;  . COLONOSCOPY     2010-2011 Dr.Kipreos   . COLOSTOMY  12/2016   Archie Endo 01/26/2017  . COLOSTOMY REVERSAL N/A 07/13/2017   Procedure: COLOSTOMY  REVERSAL;  Surgeon: Judeth Horn, MD;  Location: [place];  Service: General;  Laterality: N/A;  . CYST REMOVAL HAND Right 05/2013   thumb  . DILATION AND CURETTAGE OF UTERUS  X 5-6  . ESOPHAGOGASTRODUODENOSCOPY (EGD) WITH PROPOFOL N/A 03/13/2020   Procedure: ESOPHAGOGASTRODUODENOSCOPY (EGD) WITH PROPOFOL;  Surgeon: Carol Ada, MD;  [facility];  Service: Endoscopy;  Laterality: N/A;  . EXCISION MASS ABDOMINAL N/A 07/09/2018   Procedure: EXPLORATION OF  ABDOMINAL WOUND ERAS PATHWAY;  Surgeon: Judeth Horn, MD;  Location: [place];  Service: General;  Laterality: N/A;  . FLEXIBLE SIGMOIDOSCOPY N/A 08/24/2017   Procedure: Beryle Quant;  Surgeon: Carol Ada, MD;  Location: [place];  Service: Endoscopy;  Laterality: N/A;  . FLEXIBLE SIGMOIDOSCOPY N/A 08/31/2017   Procedure: FLEXIBLE SIGMOIDOSCOPY;  Surgeon: Carol Ada, MD;  Location: [place];  Service: Endoscopy;  Laterality: N/A;  stent placement  . FLEXIBLE SIGMOIDOSCOPY N/A 03/13/2020   Procedure: FLEXIBLE SIGMOIDOSCOPY;  Surgeon: Carol Ada, MD;  [facility];  Service: Endoscopy;  Laterality: N/A;  . HEMOSTASIS CLIP PLACEMENT  03/13/2020   Procedure: HEMOSTASIS CLIP PLACEMENT;  Surgeon: Carol Ada, MD;  [facility];  Service: Endoscopy;;  . HOT  HEMOSTASIS N/A 03/13/2020   Procedure: HOT HEMOSTASIS (ARGON PLASMA COAGULATION/BICAP);  Surgeon: Carol Ada, MD;  Location: [facility];  Service: Endoscopy;  Laterality: N/A;  . IMPLANTABLE CARDIOVERTER DEFIBRILLATOR GENERATOR CHANGE  2008  . INSERTION OF MESH N/A 07/09/2018   Procedure: INSERTION OF VICRYL MESH;  Surgeon: Judeth Horn, MD;  Location: [place];  Service: General;  Laterality: N/A;  . LAPAROTOMY N/A 09/18/2017   Procedure: EXPLORATORY LAPAROTOMY;  Surgeon: Judeth Horn, MD;  Location: [place];  Service: General;  Laterality: N/A;  . LEAD REVISION  06/22/2018  . LEAD REVISION/REPAIR N/A 06/22/2018   Procedure: LEAD REVISION/REPAIR;  Surgeon: Deboraha Sprang, MD;  Location: [place];  Service: Cardiovascular;  Laterality: N/A;  . LYSIS OF ADHESION N/A 09/18/2017   Procedure: LYSIS OF ADHESION;  Surgeon: Judeth Horn, MD;  Location: [place];  Service: General;  Laterality: N/A;  . LYSIS OF ADHESION N/A 07/09/2018   Procedure: LYSIS OF ADHESION;  Surgeon: Judeth Horn, MD;  Location: [place];  Service: General;  Laterality: N/A;  . PARTIAL COLECTOMY N/A 09/18/2017   Procedure: ILEOCOLECTOMY;  Surgeon: Judeth Horn, MD;  Location: [place];  Service: General;  Laterality: N/A;  . PARTIAL COLECTOMY  09/25/2017  . POLYPECTOMY  03/13/2020   Procedure: POLYPECTOMY;  Surgeon: Carol Ada, MD;  [facility];  Service: Endoscopy;;  . RIGHT/LEFT HEART CATH AND CORONARY ANGIOGRAPHY N/A 06/01/2018   Procedure: RIGHT/LEFT HEART CATH AND CORONARY ANGIOGRAPHY;  Surgeon: Jettie Booze, MD;  Location: [place] [facility];  Service: Cardiovascular;  Laterality: N/A;  . SIGMOIDOSCOPY N/A 09/18/2017   Procedure: SIGMOIDOSCOPY;  Surgeon: Judeth Horn, MD;  Location: [place];  Service: General;  Laterality: N/A;  . SMALL INTESTINE SURGERY  07/09/2018   EXPLORATION OF ABDOMINAL WOUND ERAS PATHWAYN/; MESH; LYSIS OF ADHESIONS  . TOTAL ABDOMINAL HYSTERECTOMY  1990  . TRANSFORAMINAL LUMBAR  INTERBODY FUSION (TLIF) WITH PEDICLE SCREW FIXATION 2 LEVEL N/A 01/07/2020   Procedure: Lumbar Four-Five and Lumbar Five-Sacral One open lumbar decompression and transforaminal lumbar interbody fusion;  Surgeon: Judith Part, MD;  Location: [facility];  Service: Neurosurgery;  Laterality: N/A;  Lumbar Four-Five and Lumbar Five-Sacral One open lumbar decompression and transforaminal lumbar interbody fusion  . TRIGGER FINGER RELEASE Right 05/213  .  TRIGGER FINGER RELEASE Right 04/08/2019   Procedure: RIGHT INDEX RELEASE TRIGGER FINGER/A-1 PULLEY;  Surgeon: Milly Jakob, MD;  Location: [place];  Service: Orthopedics;  Laterality: Right;  . TUBAL LIGATION  1980s  . VESICOVAGINAL FISTULA CLOSURE W/ TAH       Current Outpatient Medications  Medication Sig Dispense Refill  . albuterol (PROVENTIL HFA;VENTOLIN HFA) 108 (90 Base) MCG/ACT inhaler Inhale 2 puffs into the lungs every 6 (six) hours as needed for wheezing or shortness of breath.     Marland Kitchen CALCIUM-VITAMIN D PO Take 600 mg by mouth daily.    . carvedilol (COREG) 25 MG tablet Take 25 mg by mouth 2 (two) times daily.     . CRESTOR 10 MG tablet Take 1 tablet (10 mg total) by mouth at bedtime. 30 tablet 0  . D3-50 1.25 MG (50000 UT) capsule Take 50,000 Units by mouth once a week.    . DULoxetine (CYMBALTA) 60 MG capsule Take 60 mg by mouth daily.    . febuxostat (ULORIC) 40 MG tablet Take 40 mg by mouth daily.    Marland Kitchen FERREX 150 150 MG capsule Take 150 mg by mouth daily.    . fluconazole (DIFLUCAN) 100 MG tablet     . gabapentin (NEURONTIN) 300 MG capsule Take 900 mg by mouth at bedtime.     . hydrALAZINE (APRESOLINE) 25 MG tablet Take 1 tablet (25 mg total) by mouth 3 (three) times daily. 270 tablet 3  . insulin regular human CONCENTRATED (HUMULIN R U-500 KWIKPEN) 500 UNIT/ML kwikpen Inject 120 Units into the skin 3 (three) times daily with meals.     Marland Kitchen levothyroxine (SYNTHROID, LEVOTHROID) 88 MCG tablet Take 88 mcg by mouth daily before breakfast.      . loratadine (CLARITIN) 10 MG tablet Take 10 mg by mouth daily.    . montelukast (SINGULAIR) 10 MG tablet Take 10 mg by mouth at bedtime.    Marland Kitchen neomycin-polymyxin-hydrocortisone (CORTISPORIN) OTIC solution Apply 2-3 drops to toe after soaks twice daily 10 mL 1  . omega-3 acid ethyl esters (LOVAZA) 1 g capsule Take 4 g by mouth at bedtime.     . pantoprazole (PROTONIX) 40 MG tablet Take 40 mg by mouth 2 (two) times daily.   2  . PRESCRIPTION MEDICATION Inhale into the lungs at bedtime. CPAP with  2 liter oxygen    . temazepam (RESTORIL) 15 MG capsule Take 1 capsule (15 mg total) by mouth at bedtime as needed for sleep. 30 capsule 0  . torsemide (DEMADEX) 20 MG tablet Take 20 mg by mouth daily.     Marland Kitchen allopurinol (ZYLOPRIM) 100 MG tablet allopurinol 100 mg tablet     No current facility-administered medications for this visit.    Allergies:   Ace inhibitors, Glucophage [metformin hcl], Telmisartan, Advicor [niacin-lovastatin er], Bystolic [nebivolol hcl], Erythromycin, Lipitor [atorvastatin], Lopid [gemfibrozil], and Statins    Social History:  The patient  reports that she has never smoked. She has never used smokeless tobacco. She reports that she does not drink alcohol and does not use drugs.   Family History:  The patient's family history includes Anxiety disorder in her son; Arrhythmia in her mother; Asthma in her mother; Atrial fibrillation in her daughter; Breast cancer in her maternal grandmother; Cancer in her brother and father; Dementia in her brother; Diabetes in her daughter; Diabetes Mellitus II in her mother and another family member; GER disease in her daughter; Heart disease in her father and another family member; Hypertension in  her brother, daughter, daughter, daughter, father, mother, and son; Hypothyroidism in her son.    ROS:  Please see the history of present illness.   Otherwise, review of systems are positive for knee pain.   All other systems are reviewed and negative.      PHYSICAL EXAM: VS:  BP 120/60   Pulse 88   Ht 5\' 4"  (1.626 m)   Wt 242 lb (109.8 kg)   SpO2 91%   BMI 41.54 kg/m  , BMI Body mass index is 41.54 kg/m. GEN: Well nourished, well developed, in no acute distress  HEENT: normal  Neck: no JVD, carotid bruits, or masses Cardiac: RRR; no murmurs, rubs, or gallops,no edema  Respiratory:  clear to auscultation bilaterally, normal work of breathing GI: soft, nontender, nondistended, + BS MS: no deformity or atrophy  Skin: warm and dry, no rash Neuro:  Strength and sensation are intact Psych: euthymic mood, full affect   EKG:   The ekg ordered 6/21 demonstrates V paced   Recent Labs: 08/07/2019: B Natriuretic Peptide 50.2 08/18/2019: Magnesium 2.6; TSH 2.438 08/19/2019: ALT 27 01/03/2020: Platelets 253 01/07/2020: Hemoglobin 8.5 01/08/2020: BUN 36; Creatinine, Ser 1.81; Potassium 4.5; Sodium 138   Lipid Panel    Component Value Date/Time   CHOL 186 11/22/2017 1507   TRIG 279 (H) 11/22/2017 1507   HDL 48 (L) 11/22/2017 1507   CHOLHDL 3.9 11/22/2017 1507   LDLCALC 98 11/22/2017 1507     Other studies Reviewed: Additional studies/ records that were reviewed today with results demonstrating: labs from PMD reviewed..   ASSESSMENT AND PLAN:  1.   NICM: Appears euvolemic.  Normal EF in 5/21.  OK to skip torsemide 1-2 days/week if she has to go out.  If fluid retention recurs, then go back to daily torsemide.  2.   LBBB: s/p pacer.  FOllowed by Dr. Caryl Comes. 3.   CKD: Cr 1.5.  Avoid nephrotoxins.  Avoid nephrotoxins.  Using Tylenol for pain.  4.   Hyperlipidemia: TG elevated.  Considering going  on a keto diet at this time.  Continue rosuvastatin.  Whole food plant based diet info also given and she can consider this as well with Dr. Joylene Draft.   DM: 7.3 A1C per her report. In 9/21.  Was > 9 in 6/21. Exercise limited by arthritis. May need TKR.  OSA: Using CPAP.    Current medicines are reviewed at length with the patient today.  The  patient concerns regarding her medicines were addressed.  The following changes have been made:  As above  Labs/ tests ordered today include:  No orders of the defined types were placed in this encounter.   Recommend 150 minutes/week of aerobic exercise Low fat, low carb, high fiber diet recommended  Disposition:   FU in 8 months   Signed, Larae Grooms, MD  06/08/2020 8:18 AM    [facility] [address], [place], [place]  [address] Phone: [phone]; Fax: [phone]

## 2020-06-08 ENCOUNTER — Other Ambulatory Visit: Payer: Self-pay

## 2020-06-08 ENCOUNTER — Ambulatory Visit (INDEPENDENT_AMBULATORY_CARE_PROVIDER_SITE_OTHER): Payer: Medicare Other | Admitting: Interventional Cardiology

## 2020-06-08 ENCOUNTER — Encounter: Payer: Self-pay | Admitting: Interventional Cardiology

## 2020-06-08 VITALS — BP 120/60 | HR 88 | Ht 64.0 in | Wt 242.0 lb

## 2020-06-08 DIAGNOSIS — E1165 Type 2 diabetes mellitus with hyperglycemia: Secondary | ICD-10-CM | POA: Diagnosis not present

## 2020-06-08 DIAGNOSIS — I428 Other cardiomyopathies: Secondary | ICD-10-CM | POA: Diagnosis not present

## 2020-06-08 DIAGNOSIS — Z794 Long term (current) use of insulin: Secondary | ICD-10-CM

## 2020-06-08 DIAGNOSIS — E782 Mixed hyperlipidemia: Secondary | ICD-10-CM | POA: Diagnosis not present

## 2020-06-08 DIAGNOSIS — N1832 Chronic kidney disease, stage 3b: Secondary | ICD-10-CM | POA: Diagnosis not present

## 2020-06-08 DIAGNOSIS — Z95810 Presence of automatic (implantable) cardiac defibrillator: Secondary | ICD-10-CM | POA: Diagnosis not present

## 2020-06-08 DIAGNOSIS — I447 Left bundle-branch block, unspecified: Secondary | ICD-10-CM

## 2020-06-08 NOTE — Patient Instructions (Signed)
Medication Instructions:  °Your physician recommends that you continue on your current medications as directed. Please refer to the Current Medication list given to you today. ° °*If you need a refill on your cardiac medications before your next appointment, please call your pharmacy* ° ° °Lab Work: °None ° °If you have labs (blood work) drawn today and your tests are completely normal, you will receive your results only by: °• MyChart Message (if you have MyChart) OR °• A paper copy in the mail °If you have any lab test that is abnormal or we need to change your treatment, we will call you to review the results. ° ° °Testing/Procedures: °None ° ° °Follow-Up: °At [facility], you and your health needs are our priority.  As part of our continuing mission to provide you with exceptional heart care, we have created designated Provider Care Teams.  These Care Teams include your primary Cardiologist (physician) and Advanced Practice Providers (APPs -  Physician Assistants and Nurse Practitioners) who all work together to provide you with the care you need, when you need it. ° °We recommend signing up for the patient portal called "MyChart".  Sign up information is provided on this After Visit Summary.  MyChart is used to connect with patients for Virtual Visits (Telemedicine).  Patients are able to view lab/test results, encounter notes, upcoming appointments, etc.  Non-urgent messages can be sent to your provider as well.   °To learn more about what you can do with MyChart, go to [email or web address].   ° °Your next appointment:   °7 month(s) ° °The format for your next appointment:   °In Person ° °Provider:   °You may see Jayadeep Varanasi, MD or one of the following Advanced Practice Providers on your designated Care Team:   °· Dayna Dunn, PA-C °· Michele Lenze, PA-C ° ° ° °Other Instructions ° °High-Fiber Diet °Fiber, also called dietary fiber, is a type of carbohydrate that is found in fruits, vegetables,  whole grains, and beans. A high-fiber diet can have many health benefits. Your health care provider may recommend a high-fiber diet to help: °· Prevent constipation. Fiber can make your bowel movements more regular. °· Lower your cholesterol. °· Relieve the following conditions: °? Swelling of veins in the anus (hemorrhoids). °? Swelling and irritation (inflammation) of specific areas of the digestive tract (uncomplicated diverticulosis). °? A problem of the large intestine (colon) that sometimes causes pain and diarrhea (irritable bowel syndrome, IBS). °· Prevent overeating as part of a weight-loss plan. °· Prevent heart disease, type 2 diabetes, and certain cancers. °What is my plan? °The recommended daily fiber intake in grams (g) includes: °· 38 g for men age 50 or younger. °· 30 g for men over age 50. °· 25 g for women age 50 or younger. °· 21 g for women over age 50. °You can get the recommended daily intake of dietary fiber by: °· Eating a variety of fruits, vegetables, grains, and beans. °· Taking a fiber supplement, if it is not possible to get enough fiber through your diet. °What do I need to know about a high-fiber diet? °· It is better to get fiber through food sources rather than from fiber supplements. There is not a lot of research about how effective supplements are. °· Always check the fiber content on the nutrition facts label of any prepackaged food. Look for foods that contain 5 g of fiber or more per serving. °· Talk with a diet and nutrition specialist (dietitian) if   you have questions about specific foods that are recommended or not recommended for your medical condition, especially if those foods are not listed below. °· Gradually increase how much fiber you consume. If you increase your intake of dietary fiber too quickly, you may have bloating, cramping, or gas. °· Drink plenty of water. Water helps you to digest fiber. °What are tips for following this plan? °· Eat a wide variety of  high-fiber foods. °· Make sure that half of the grains that you eat each day are whole grains. °· Eat breads and cereals that are made with whole-grain flour instead of refined flour or white flour. °· Eat brown rice, bulgur wheat, or millet instead of white rice. °· Start the day with a breakfast that is high in fiber, such as a cereal that contains 5 g of fiber or more per serving. °· Use beans in place of meat in soups, salads, and pasta dishes. °· Eat high-fiber snacks, such as berries, raw vegetables, nuts, and popcorn. °· Choose whole fruits and vegetables instead of processed forms like juice or sauce. °What foods can I eat? ° °Fruits °Berries. Pears. Apples. Oranges. Avocado. Prunes and raisins. Dried figs. °Vegetables °Sweet potatoes. Spinach. Kale. Artichokes. Cabbage. Broccoli. Cauliflower. Green peas. Carrots. Squash. °Grains °Whole-grain breads. Multigrain cereal. Oats and oatmeal. Brown rice. Barley. Bulgur wheat. Millet. Quinoa. Bran muffins. Popcorn. Rye wafer crackers. °Meats and other proteins °Navy, kidney, and pinto beans. Soybeans. Split peas. Lentils. Nuts and seeds. °Dairy °Fiber-fortified yogurt. °Beverages °Fiber-fortified soy milk. Fiber-fortified orange juice. °Other foods °Fiber bars. °The items listed above may not be a complete list of recommended foods and beverages. Contact a dietitian for more options. °What foods are not recommended? °Fruits °Fruit juice. Cooked, strained fruit. °Vegetables °Fried potatoes. Canned vegetables. Well-cooked vegetables. °Grains °White bread. Pasta made with refined flour. White rice. °Meats and other proteins °Fatty cuts of meat. Fried chicken or fried fish. °Dairy °Milk. Yogurt. Cream cheese. Sour cream. °Fats and oils °Butters. °Beverages °Soft drinks. °Other foods °Cakes and pastries. °The items listed above may not be a complete list of foods and beverages to avoid. Contact a dietitian for more information. °Summary °· Fiber is a type of  carbohydrate. It is found in fruits, vegetables, whole grains, and beans. °· There are many health benefits of eating a high-fiber diet, such as preventing constipation, lowering blood cholesterol, helping with weight loss, and reducing your risk of heart disease, diabetes, and certain cancers. °· Gradually increase your intake of fiber. Increasing too fast can result in cramping, bloating, and gas. Drink plenty of water while you increase your fiber. °· The best sources of fiber include whole fruits and vegetables, whole grains, nuts, seeds, and beans. °This information is not intended to replace advice given to you by your health care provider. Make sure you discuss any questions you have with your health care provider. °Document Revised: 05/15/2017 Document Reviewed: 05/15/2017 °Elsevier Patient Education © 2020 [employer] ° ° °

## 2020-06-15 DIAGNOSIS — M170 Bilateral primary osteoarthritis of knee: Secondary | ICD-10-CM | POA: Diagnosis not present

## 2020-06-22 DIAGNOSIS — N1832 Chronic kidney disease, stage 3b: Secondary | ICD-10-CM | POA: Diagnosis not present

## 2020-06-26 ENCOUNTER — Ambulatory Visit (INDEPENDENT_AMBULATORY_CARE_PROVIDER_SITE_OTHER): Payer: Medicare Other | Admitting: Podiatry

## 2020-06-26 ENCOUNTER — Ambulatory Visit (INDEPENDENT_AMBULATORY_CARE_PROVIDER_SITE_OTHER): Payer: Medicare Other

## 2020-06-26 ENCOUNTER — Encounter: Payer: Self-pay | Admitting: Podiatry

## 2020-06-26 ENCOUNTER — Other Ambulatory Visit: Payer: Self-pay

## 2020-06-26 DIAGNOSIS — M2010 Hallux valgus (acquired), unspecified foot: Secondary | ICD-10-CM

## 2020-06-26 DIAGNOSIS — I5022 Chronic systolic (congestive) heart failure: Secondary | ICD-10-CM

## 2020-06-26 DIAGNOSIS — E119 Type 2 diabetes mellitus without complications: Secondary | ICD-10-CM

## 2020-06-26 DIAGNOSIS — I428 Other cardiomyopathies: Secondary | ICD-10-CM | POA: Diagnosis not present

## 2020-06-26 NOTE — Progress Notes (Signed)
This patient returns to my office for at risk foot care.  This patient requires this care by a professional since this patient will be at risk due to having type 2 diabetes with stage 3 CKD. She has history of nail surgery which has healed uneventfully.  She presents for a diabetic foot exam.   This patient presents for at risk foot care today.  General Appearance  Alert, conversant and in no acute stress.  Vascular  Dorsalis pedis and posterior tibial  pulses are palpable  bilaterally.  Capillary return is within normal limits  bilaterally. Temperature is within normal limits  bilaterally.  Neurologic  Senn-Weinstein monofilament wire test within normal limits  bilaterally. Muscle power within normal limits bilaterally.  Nails Normotropic nails with no signs of infection or drainage..  Orthopedic  No limitations of motion  feet .  No crepitus or effusions noted.  Hallux limitus 1st MPJ right .  Gout 1st MPJ  B/L.  Skin  normotropic skin with no porokeratosis noted bilaterally.  No signs of infections or ulcers noted.     Diabetes with no complications.  HAV  B/L  Consent was obtained for treatment procedures.   Diabetic exam performed.  No evidence of vascular or neurologic pathology.   Return office visit  1 year.                    Told patient to return for periodic foot care and evaluation due to potential at risk complications.   Gardiner Barefoot DPM

## 2020-06-28 LAB — CUP PACEART REMOTE DEVICE CHECK
Battery Remaining Longevity: 84 mo
Battery Remaining Percentage: 95 %
Brady Statistic RA Percent Paced: 0 %
Brady Statistic RV Percent Paced: 99 %
Date Time Interrogation Session: 20211203020100
HighPow Impedance: 78 Ohm
Implantable Lead Implant Date: 20191129
Implantable Lead Implant Date: 20191129
Implantable Lead Implant Date: 20191129
Implantable Lead Location: 753858
Implantable Lead Location: 753859
Implantable Lead Location: 753860
Implantable Lead Model: 293
Implantable Lead Model: 4086
Implantable Lead Serial Number: 226691
Implantable Lead Serial Number: 440868
Implantable Pulse Generator Implant Date: 20191129
Lead Channel Impedance Value: 480 Ohm
Lead Channel Impedance Value: 614 Ohm
Lead Channel Impedance Value: 852 Ohm
Lead Channel Setting Pacing Amplitude: 2 V
Lead Channel Setting Pacing Amplitude: 2.5 V
Lead Channel Setting Pacing Amplitude: 3 V
Lead Channel Setting Pacing Pulse Width: 0.4 ms
Lead Channel Setting Pacing Pulse Width: 1 ms
Lead Channel Setting Sensing Sensitivity: 0.5 mV
Lead Channel Setting Sensing Sensitivity: 1 mV
Pulse Gen Serial Number: 227627

## 2020-07-02 DIAGNOSIS — Z79891 Long term (current) use of opiate analgesic: Secondary | ICD-10-CM | POA: Diagnosis not present

## 2020-07-02 DIAGNOSIS — M170 Bilateral primary osteoarthritis of knee: Secondary | ICD-10-CM | POA: Diagnosis not present

## 2020-07-02 DIAGNOSIS — G894 Chronic pain syndrome: Secondary | ICD-10-CM | POA: Diagnosis not present

## 2020-07-03 DIAGNOSIS — I129 Hypertensive chronic kidney disease with stage 1 through stage 4 chronic kidney disease, or unspecified chronic kidney disease: Secondary | ICD-10-CM | POA: Diagnosis not present

## 2020-07-03 DIAGNOSIS — D509 Iron deficiency anemia, unspecified: Secondary | ICD-10-CM | POA: Diagnosis not present

## 2020-07-03 DIAGNOSIS — I5042 Chronic combined systolic (congestive) and diastolic (congestive) heart failure: Secondary | ICD-10-CM | POA: Diagnosis not present

## 2020-07-03 DIAGNOSIS — N1832 Chronic kidney disease, stage 3b: Secondary | ICD-10-CM | POA: Diagnosis not present

## 2020-07-03 DIAGNOSIS — N179 Acute kidney failure, unspecified: Secondary | ICD-10-CM | POA: Diagnosis not present

## 2020-07-03 DIAGNOSIS — E559 Vitamin D deficiency, unspecified: Secondary | ICD-10-CM | POA: Diagnosis not present

## 2020-07-03 DIAGNOSIS — E1122 Type 2 diabetes mellitus with diabetic chronic kidney disease: Secondary | ICD-10-CM | POA: Diagnosis not present

## 2020-07-07 NOTE — Progress Notes (Signed)
Remote ICD transmission.   

## 2020-07-21 ENCOUNTER — Encounter: Payer: Self-pay | Admitting: Physical Medicine & Rehabilitation

## 2020-07-23 ENCOUNTER — Other Ambulatory Visit: Payer: Self-pay

## 2020-07-23 ENCOUNTER — Ambulatory Visit (INDEPENDENT_AMBULATORY_CARE_PROVIDER_SITE_OTHER): Payer: Medicare Other | Admitting: Physician Assistant

## 2020-07-23 ENCOUNTER — Encounter: Payer: Self-pay | Admitting: Physician Assistant

## 2020-07-23 VITALS — BP 118/60 | HR 91 | Ht 64.0 in | Wt 241.0 lb

## 2020-07-23 DIAGNOSIS — I5042 Chronic combined systolic (congestive) and diastolic (congestive) heart failure: Secondary | ICD-10-CM

## 2020-07-23 DIAGNOSIS — Z95810 Presence of automatic (implantable) cardiac defibrillator: Secondary | ICD-10-CM | POA: Diagnosis not present

## 2020-07-23 DIAGNOSIS — I428 Other cardiomyopathies: Secondary | ICD-10-CM

## 2020-07-23 NOTE — Progress Notes (Signed)
Cardiology Office Note Date:  07/23/2020  Patient ID:  Crystal [name], Crystal [name] [birth year], MRN [id number] PCP:  Crist Infante, MD  Cardiologist:  Dr. Irish Lack Electrophysiologist: Dr. Caryl Comes    Chief Complaint: annual device visit  History of Present Illness: Crystal [name] is a 69 y.o. female with history of chronic CHF (systolic), NICM, LBBB, NOD by cath 2019, CKD (III), OSA w/CPAP, morbid obesity, AT,  Colits/colostomy >reversed  She comes today to be seen for dr. Caryl Comes, last seen by him Dec 2020, noted an AT though none further at that visit, no changes were made..  She saw Dr. Irish Lack Nov 2021, limited with arthritis, recently had anemia required transfusion, doing better. Discussed use of torsemide with instructions given, ok to skip periodically and resume daily if edema.  TODAY She is doing "OK", but the last 6 months or so have been difficult she had a back surgery in July and terrible knes that have really slowed her down, her exertional capacity also slowed some She denies rest SOB, she uses CPA w/O2 at night and denies any nocturnal SOB, symptoms. She does get winded with ambulation.  This is not new but worse.   We discussed her DOE at length. 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 She denies any CP, palpitations or cardiac awareness. She has put on weight but slow and steady and again, generally slowed and doing less with her back and knees. She continues her demadex daily  She has also been treated for symptomatic anemia over the summer and says her nephrologist has been managing this, her last check in Oct Hbg was steady in the 10's  Says she really has a lot of pain and thinks this is part of her DOE as well  Denies any near syncope or syncope.   Device information BSCi CTR-D RV, LV and generator implanted11/29/2019 RA lead is from initial system (2005) H/o initial device RV/LV lead  faillure w/epicardial LV lead, these remain and are abandoned  Past Medical History:  Diagnosis Date  . AICD (automatic cardioverter/defibrillator) present    high RV threshold chronically, device was turned off in 2014; Device battery has been dead x 7 years; "turned it back on 06/22/2018"  . Anemia, iron deficiency   . Angioedema    felt to likely be due to ace inhibitors but says she has had this even off of medicines, appears to be tolerating ARBs chronically  . Anxiety   . Arthritis    "hands, legs, arms; bad in my back" (06/22/2018)  . Asthmatic bronchitis with status asthmaticus   . Bradycardia   . CHF (congestive heart failure) ([place])   . Chronic lower back pain   . Chronic pain   . Chronic renal insufficiency   . CKD (chronic kidney disease), stage III ([place])   . Coronary artery disease    Mild nonobstructive (30% LAD, 30% RCA) by 09/01/16 cath at [facility]  . Degenerative joint disease   . Depression   . Diabetes mellitus, type 2 ([place])   . Diabetic peripheral neuropathy ([place])   . Dizziness   . Dyspnea on exertion   . Family history of adverse reaction to anesthesia    Mother has nausea  . GERD (gastroesophageal reflux disease)   . Gout   . Heart valve disorder   . History of blood transfusion 1981; ~ 2005; 12/2016   "childbirth; defibrillator OR;  colostomy OR"  . History of mononucleosis 03/2014  . Hyperlipidemia   . Hypertension   . Hypothyroid   . Insomnia   . Intervertebral disc degeneration   . Ischemic cardiomyopathy   . LBBB (left bundle branch block)   . Myocardial infarction ([place]) dx'd ~ 2005  . Nonischemic cardiomyopathy ([place])    s/p ICD in 2005. EF has since recovered  . Obesity   . On home oxygen therapy    "2L at night" (06/22/2018)  . OSA on CPAP   . Pneumonia    "couple times; last time was 12/2016" (06/22/2018)  . PONV (postoperative nausea and vomiting)   . Presence of permanent cardiac pacemaker    [employer]  . Swelling   .  Syncope   . Systemic hypertension   . Vitamin D deficiency     Past Surgical History:  Procedure Laterality Date  . APPENDECTOMY  07/13/2017  . BLADDER SUSPENSION  1990   "w/hysterectomy"  . BOWEL RESECTION N/A 07/09/2018   Procedure: SMALL BOWEL RESECTION;  Surgeon: Judeth Horn, MD;  Location: [place];  Service: General;  Laterality: N/A;  . CARDIAC CATHETERIZATION  2005   no obstructive CAD per patient  . CARDIAC CATHETERIZATION  2018  . CARDIAC DEFIBRILLATOR PLACEMENT  2005   BiV ICD implanted,  LV lead is an epicardial lead  . CARPAL TUNNEL RELEASE Left 07/25/2014   Dr.Williamson   . CARPAL TUNNEL RELEASE Right 04/08/2019   Procedure: RIGHT CARPAL TUNNEL RELEASE ENDOSCOPIC;  Surgeon: Milly Jakob, MD;  Location: [place];  Service: Orthopedics;  Laterality: Right;  . CATARACT EXTRACTION W/ INTRAOCULAR LENS  IMPLANT, BILATERAL Bilateral   . COLONIC STENT PLACEMENT N/A 08/31/2017   Procedure: COLONIC STENT PLACEMENT;  Surgeon: Carol Ada, MD;  Location: [facility];  Service: Endoscopy;  Laterality: N/A;  . COLONOSCOPY     2010-2011 Dr.Kipreos   . COLOSTOMY  12/2016   Archie Endo 01/26/2017  . COLOSTOMY REVERSAL N/A 07/13/2017   Procedure: COLOSTOMY REVERSAL;  Surgeon: Judeth Horn, MD;  Location: [place] [facility];  Service: General;  Laterality: N/A;  . CYST REMOVAL HAND Right 05/2013   thumb  . DILATION AND CURETTAGE OF UTERUS  X 5-6  . ESOPHAGOGASTRODUODENOSCOPY (EGD) WITH PROPOFOL N/A 03/13/2020   Procedure: ESOPHAGOGASTRODUODENOSCOPY (EGD) WITH PROPOFOL;  Surgeon: Carol Ada, MD;  [facility];  Service: Endoscopy;  Laterality: N/A;  . EXCISION MASS ABDOMINAL N/A 07/09/2018   Procedure: EXPLORATION OF  ABDOMINAL WOUND ERAS PATHWAY;  Surgeon: Judeth Horn, MD;  Location: [place];  Service: General;  Laterality: N/A;  . FLEXIBLE SIGMOIDOSCOPY N/A 08/24/2017   Procedure: Beryle Quant;  Surgeon: Carol Ada, MD;  Location: [place];  Service: Endoscopy;  Laterality:  N/A;  . FLEXIBLE SIGMOIDOSCOPY N/A 08/31/2017   Procedure: FLEXIBLE SIGMOIDOSCOPY;  Surgeon: Carol Ada, MD;  Location: [place];  Service: Endoscopy;  Laterality: N/A;  stent placement  . FLEXIBLE SIGMOIDOSCOPY N/A 03/13/2020   Procedure: FLEXIBLE SIGMOIDOSCOPY;  Surgeon: Carol Ada, MD;  [facility];  Service: Endoscopy;  Laterality: N/A;  . HEMOSTASIS CLIP PLACEMENT  03/13/2020   Procedure: HEMOSTASIS CLIP PLACEMENT;  Surgeon: Carol Ada, MD;  [facility];  Service: Endoscopy;;  . HOT HEMOSTASIS N/A 03/13/2020   Procedure: HOT HEMOSTASIS (ARGON PLASMA COAGULATION/BICAP);  Surgeon: Carol Ada, MD;  [facility];  Service: Endoscopy;  Laterality: N/A;  . IMPLANTABLE CARDIOVERTER DEFIBRILLATOR GENERATOR CHANGE  2008  . INSERTION OF MESH N/A 07/09/2018   Procedure: INSERTION OF VICRYL MESH;  Surgeon: Judeth Horn, MD;  Location: [facility];  Service: General;  Laterality: N/A;  . LAPAROTOMY N/A 09/18/2017   Procedure: EXPLORATORY LAPAROTOMY;  Surgeon: Judeth Horn, MD;  Location: [place];  Service: General;  Laterality: N/A;  . LEAD REVISION  06/22/2018  . LEAD REVISION/REPAIR N/A 06/22/2018   Procedure: LEAD REVISION/REPAIR;  Surgeon: Deboraha Sprang, MD;  Location: [place] [facility];  Service: Cardiovascular;  Laterality: N/A;  . LYSIS OF ADHESION N/A 09/18/2017   Procedure: LYSIS OF ADHESION;  Surgeon: Judeth Horn, MD;  Location: [place] [place];  Service: General;  Laterality: N/A;  . LYSIS OF ADHESION N/A 07/09/2018   Procedure: LYSIS OF ADHESION;  Surgeon: Judeth Horn, MD;  Location: [place];  Service: General;  Laterality: N/A;  . PARTIAL COLECTOMY N/A 09/18/2017   Procedure: ILEOCOLECTOMY;  Surgeon: Judeth Horn, MD;  Location: [place];  Service: General;  Laterality: N/A;  . PARTIAL COLECTOMY  09/25/2017  . POLYPECTOMY  03/13/2020   Procedure: POLYPECTOMY;  Surgeon: Carol Ada, MD;  [facility];  Service: Endoscopy;;  . RIGHT/LEFT HEART CATH AND  CORONARY ANGIOGRAPHY N/A 06/01/2018   Procedure: RIGHT/LEFT HEART CATH AND CORONARY ANGIOGRAPHY;  Surgeon: Jettie Booze, MD;  Location: [facility] [facility];  Service: Cardiovascular;  Laterality: N/A;  . SIGMOIDOSCOPY N/A 09/18/2017   Procedure: SIGMOIDOSCOPY;  Surgeon: Judeth Horn, MD;  Location: [facility] [place];  Service: General;  Laterality: N/A;  . SMALL INTESTINE SURGERY  07/09/2018   EXPLORATION OF ABDOMINAL WOUND ERAS PATHWAYN/; MESH; LYSIS OF ADHESIONS  . TOTAL ABDOMINAL HYSTERECTOMY  1990  . TRANSFORAMINAL LUMBAR INTERBODY FUSION (TLIF) WITH PEDICLE SCREW FIXATION 2 LEVEL N/A 01/07/2020   Procedure: Lumbar Four-Five and Lumbar Five-Sacral One open lumbar decompression and transforaminal lumbar interbody fusion;  Surgeon: Judith Part, MD;  Location: [place];  Service: Neurosurgery;  Laterality: N/A;  Lumbar Four-Five and Lumbar Five-Sacral One open lumbar decompression and transforaminal lumbar interbody fusion  . TRIGGER FINGER RELEASE Right 05/213  . TRIGGER FINGER RELEASE Right 04/08/2019   Procedure: RIGHT INDEX RELEASE TRIGGER FINGER/A-1 PULLEY;  Surgeon: Milly Jakob, MD;  Location: [facility] [place];  Service: Orthopedics;  Laterality: Right;  . TUBAL LIGATION  1980s  . VESICOVAGINAL FISTULA CLOSURE W/ TAH      Current Outpatient Medications  Medication Sig Dispense Refill  . albuterol (PROVENTIL HFA;VENTOLIN HFA) 108 (90 Base) MCG/ACT inhaler Inhale 2 puffs into the lungs every 6 (six) hours as needed for wheezing or shortness of breath.     . allopurinol (ZYLOPRIM) 100 MG tablet allopurinol 100 mg tablet    . CALCIUM-VITAMIN D PO Take 600 mg by mouth daily.    . carvedilol (COREG) 25 MG tablet Take 25 mg by mouth 2 (two) times daily.     . Cholecalciferol 25 MCG (1000 UT) capsule Take 1,000 Units by mouth daily.    . CRESTOR 10 MG tablet Take 1 tablet (10 mg total) by mouth at bedtime. 30 tablet 0  . DULoxetine (CYMBALTA) 60 MG capsule Take 60 mg by mouth daily.    . febuxostat  (ULORIC) 40 MG tablet Take 40 mg by mouth daily.    Marland Kitchen FERREX 150 150 MG capsule Take 150 mg by mouth daily.    Marland Kitchen gabapentin (NEURONTIN) 300 MG capsule Take 900 mg by mouth at bedtime.     . hydrALAZINE (APRESOLINE) 25 MG tablet Take 1 tablet (25 mg total) by mouth 3 (three) times daily. 270 tablet 3  . insulin regular human CONCENTRATED (  HUMULIN R U-500 KWIKPEN) 500 UNIT/ML kwikpen Inject 130 Units into the skin 3 (three) times daily with meals.    Marland Kitchen levothyroxine (SYNTHROID, LEVOTHROID) 88 MCG tablet Take 88 mcg by mouth daily before breakfast.     . loratadine (CLARITIN) 10 MG tablet Take 10 mg by mouth daily.    . montelukast (SINGULAIR) 10 MG tablet Take 10 mg by mouth at bedtime.    Marland Kitchen neomycin-polymyxin-hydrocortisone (CORTISPORIN) OTIC solution Apply 2-3 drops to toe after soaks twice daily 10 mL 1  . omega-3 acid ethyl esters (LOVAZA) 1 g capsule Take 4 g by mouth at bedtime.     . pantoprazole (PROTONIX) 40 MG tablet Take 40 mg by mouth 2 (two) times daily.   2  . PRESCRIPTION MEDICATION Inhale into the lungs at bedtime. CPAP with  2 liter oxygen    . temazepam (RESTORIL) 15 MG capsule Take 1 capsule (15 mg total) by mouth at bedtime as needed for sleep. 30 capsule 0  . torsemide (DEMADEX) 20 MG tablet Take 20 mg by mouth daily.      No current facility-administered medications for this visit.    Allergies:   Ace inhibitors, Glucophage [metformin hcl], Telmisartan, Advicor [niacin-lovastatin er], Bystolic [nebivolol hcl], Erythromycin, Lipitor [atorvastatin], Lopid [gemfibrozil], and Statins   Social History:  The patient  reports that she has never smoked. She has never used smokeless tobacco. She reports that she does not drink alcohol and does not use drugs.   Family History:  The patient's family history includes Anxiety disorder in her son; Arrhythmia in her mother; Asthma in her mother; Atrial fibrillation in her daughter; Breast cancer in her maternal grandmother; Cancer in her  brother and father; Dementia in her brother; Diabetes in her daughter; Diabetes Mellitus II in her mother and another family member; GER disease in her daughter; Heart disease in her father and another family member; Hypertension in her brother, daughter, daughter, daughter, father, mother, and son; Hypothyroidism in her son.   ROS:  Please see the history of present illness.    All other systems are reviewed and otherwise negative.   PHYSICAL EXAM:  VS:  BP 118/60   Pulse 91   Ht 5\' 4"  (1.626 m)   Wt 241 lb (109.3 kg)   SpO2 95%   BMI 41.37 kg/m  BMI: Body mass index is 41.37 kg/m. Well nourished, well developed, in no acute distress HEENT: normocephalic, atraumatic Neck: no JVD, carotid bruits or masses Cardiac:  RRR; no significant murmurs, no rubs, or gallops Lungs:  CTA b/l, no wheezing, rhonchi or rales Abd: soft, nontender, central obesity MS: no deformity or atrophy Ext: no edema Skin: warm and dry, no rash Neuro:  No gross deficits appreciated Psych: euthymic mood, full affect  ICD site is stable, no tethering or discomfort.  It has an appearance of swelling though is not, feels like soft tissue, no fluctuation and she says that it has been this way since implant.   EKG:  Not done today  Device interrogation done today and reviewed by myself:  Battery and lead measurements are stable RA threshold is 1.7 (1.5 last year) output increased to 2.5V (AP <1%) LV 2.6V/1.40ms (2.4 last year), output increased to 3.0V No arrhythmias    12/13/2019: TTE IMPRESSIONS  1. Normal LV systolic function; grade 1 diastolic dysfunction; moderate  LVH; mild RVE.  2. Left ventricular ejection fraction, by estimation, is 55 to 60%. The  left ventricle has normal function. The left ventricle  has no regional  wall motion abnormalities. There is moderate left ventricular hypertrophy.  Left ventricular diastolic  parameters are consistent with Grade I diastolic dysfunction (impaired   relaxation).  3. Right ventricular systolic function is normal. The right ventricular  size is mildly enlarged. There is normal pulmonary artery systolic  pressure.  4. The mitral valve is normal in structure. Trivial mitral valve  regurgitation. No evidence of mitral stenosis.  5. The aortic valve is tricuspid. Aortic valve regurgitation is not  visualized. Mild aortic valve sclerosis is present, with no evidence of  aortic valve stenosis.  6. The inferior vena cava is normal in size with greater than 50%  respiratory variability, suggesting right atrial pressure of 3 mmHg.    Echo in 09/2018: The left ventricle has severely reduced systolic function, with an ejection fraction of 25-30%. The cavity size was moderately dilated. There is mildly increased left ventricular wall thickness. Left ventricular diastolic Doppler parameters are  consistent with impaired relaxation. Left ventricular diffuse hypokinesis. 2. The right ventricle has normal systolic function. The cavity was normal. There is no increase in right ventricular wall thickness. 3. Mild thickening of the mitral valve leaflet. There is mild mitral annular calcification present. 4. The tricuspid valve is grossly normal. 5. The aortic valve is tricuspid Mild thickening of the aortic valve. 6. There is mild dilatation of the aortic root and of the ascending aorta measuring 39 mm. 7. Severe global reduction in LV systolic function; mild diastolic dysfunction; moderate LVE; mild LVH; mild MR.   05/2018 cath showed:  Prox LAD lesion is 25% stenosed.  Ost 1st Diag lesion is 25% stenosed.  Mid LM lesion is 10% stenosed.  LV end diastolic pressure is mildly elevated. LVEDP 17 mm Hg.  There is no aortic valve stenosis.  Hemodynamic findings consistent with mild pulmonary hypertension.  Ao sat 98%, PA sat 66%, CO 4.79 L/min; CI 2.2, mean PA 30 mm Hg; mean PCWP 22 mm Hg  No significant CAD.   Medical therapy for  LV dysfunction.   Recent Labs: 08/07/2019: B Natriuretic Peptide 50.2 08/18/2019: Magnesium 2.6; TSH 2.438 08/19/2019: ALT 27 01/03/2020: Platelets 253 01/07/2020: Hemoglobin 8.5 01/08/2020: BUN 36; Creatinine, Ser 1.81; Potassium 4.5; Sodium 138  No results found for requested labs within last 8760 hours.   CrCl cannot be calculated (Patient's most recent lab result is older than the maximum 21 days allowed.).   Wt Readings from Last 3 Encounters:  07/23/20 241 lb (109.3 kg)  06/08/20 242 lb (109.8 kg)  03/13/20 233 lb (105.7 kg)     Other studies reviewed: Additional studies/records reviewed today include: summarized above  ASSESSMENT AND PLAN:  1. ICD     Intact function, programmed as above  2. NICM 3. chronic CHF     Recovered LVEF by echo May 2021, grade I DD     + DOE, though felt to be multifactorial.     Her weight is down a lb from Nov visit, heart logic score is zero.     The patient does not feel like she is volume OL, her lungs are ckear     On BB, hydralazine, Demadex      Angioedema with ACE      Labs followed with nephrology      Discussed daily weights and notify Dr. Clarisa Fling if rapid weight gain or escalating SOB    Disposition: F/u with remotes and in clinic with EP in a year, sooner if needed  Current  medicines are reviewed at length with the patient today.  The patient did not have any concerns regarding medicines.  Venetia Night, PA-C 07/23/2020 2:33 PM     [place] [phone] (office)  [phone] (fax)

## 2020-07-23 NOTE — Patient Instructions (Signed)
Medication Instructions:  *If you need a refill on your cardiac medications before your next appointment, please call your pharmacy*  Follow-Up: At [facility], you and your health needs are our priority.  As part of our continuing mission to provide you with exceptional heart care, we have created designated Provider Care Teams.  These Care Teams include your primary Cardiologist (physician) and Advanced Practice Providers (APPs -  Physician Assistants and Nurse Practitioners) who all work together to provide you with the care you need, when you need it.  We recommend signing up for the patient portal called "MyChart".  Sign up information is provided on this After Visit Summary.  MyChart is used to connect with patients for Virtual Visits (Telemedicine).  Patients are able to view lab/test results, encounter notes, upcoming appointments, etc.  Non-urgent messages can be sent to your provider as well.   To learn more about what you can do with MyChart, go to [email or web address].    Your next appointment:   Your physician wants you to follow-up in: 1 YEAR with Dr. Caryl Comes. You will receive a reminder letter in the mail two months in advance. If you don't receive a letter, please call our office to schedule the follow-up appointment.  Remote monitoring is used to monitor your ICD from home. This monitoring reduces the number of office visits required to check your device to one time per year. It allows [place] to keep an eye on the functioning of your device to ensure it is working properly. You are scheduled for a device check from home on 09/25/20. You may send your transmission at any time that day. If you have a wireless device, the transmission will be sent automatically. After your physician reviews your transmission, you will receive a postcard with your next transmission date.

## 2020-07-28 DIAGNOSIS — M65322 Trigger finger, left index finger: Secondary | ICD-10-CM | POA: Diagnosis not present

## 2020-07-28 DIAGNOSIS — M65341 Trigger finger, right ring finger: Secondary | ICD-10-CM | POA: Diagnosis not present

## 2020-07-29 ENCOUNTER — Encounter: Payer: Medicare Other | Admitting: Student

## 2020-08-06 DIAGNOSIS — Z23 Encounter for immunization: Secondary | ICD-10-CM | POA: Diagnosis not present

## 2020-08-11 DIAGNOSIS — G609 Hereditary and idiopathic neuropathy, unspecified: Secondary | ICD-10-CM | POA: Diagnosis not present

## 2020-08-11 DIAGNOSIS — M109 Gout, unspecified: Secondary | ICD-10-CM | POA: Diagnosis not present

## 2020-08-11 DIAGNOSIS — E785 Hyperlipidemia, unspecified: Secondary | ICD-10-CM | POA: Diagnosis not present

## 2020-08-11 DIAGNOSIS — Z95810 Presence of automatic (implantable) cardiac defibrillator: Secondary | ICD-10-CM | POA: Diagnosis not present

## 2020-08-11 DIAGNOSIS — I10 Essential (primary) hypertension: Secondary | ICD-10-CM | POA: Diagnosis not present

## 2020-08-11 DIAGNOSIS — I509 Heart failure, unspecified: Secondary | ICD-10-CM | POA: Diagnosis not present

## 2020-08-11 DIAGNOSIS — F329 Major depressive disorder, single episode, unspecified: Secondary | ICD-10-CM | POA: Diagnosis not present

## 2020-08-11 DIAGNOSIS — K219 Gastro-esophageal reflux disease without esophagitis: Secondary | ICD-10-CM | POA: Diagnosis not present

## 2020-08-11 DIAGNOSIS — E1129 Type 2 diabetes mellitus with other diabetic kidney complication: Secondary | ICD-10-CM | POA: Diagnosis not present

## 2020-08-11 DIAGNOSIS — E1159 Type 2 diabetes mellitus with other circulatory complications: Secondary | ICD-10-CM | POA: Diagnosis not present

## 2020-08-13 DIAGNOSIS — G894 Chronic pain syndrome: Secondary | ICD-10-CM | POA: Diagnosis not present

## 2020-08-19 DIAGNOSIS — E1159 Type 2 diabetes mellitus with other circulatory complications: Secondary | ICD-10-CM | POA: Diagnosis not present

## 2020-08-19 DIAGNOSIS — E1129 Type 2 diabetes mellitus with other diabetic kidney complication: Secondary | ICD-10-CM | POA: Diagnosis not present

## 2020-08-20 ENCOUNTER — Other Ambulatory Visit: Payer: Self-pay

## 2020-08-20 ENCOUNTER — Encounter: Payer: Self-pay | Admitting: Physical Medicine & Rehabilitation

## 2020-08-20 ENCOUNTER — Encounter: Payer: Medicare Other | Attending: Physical Medicine & Rehabilitation | Admitting: Physical Medicine & Rehabilitation

## 2020-08-20 VITALS — BP 111/69 | HR 84 | Temp 97.9°F | Ht 64.0 in | Wt 241.4 lb

## 2020-08-20 DIAGNOSIS — M170 Bilateral primary osteoarthritis of knee: Secondary | ICD-10-CM | POA: Insufficient documentation

## 2020-08-20 NOTE — Patient Instructions (Signed)
Nerve blocks to the knees , will do Left one first  May need to try PT as well

## 2020-08-20 NOTE — Progress Notes (Signed)
Subjective:    Patient ID: Crystal [name], female    DOB: [birth year], 70 y.o.   MRN: [id number] DATE OF ADMISSION:  02/06/2017 DATE OF DISCHARGE:  02/15/2017 70 year old right-handed female with history of diabetes mellitus, nonischemic cardiomyopathy with defibrillator, CKD stage 3.  Lives in [place], [place]; independent, working part-time prior to admission.  She has a daughter with good support.  Presented to [facility] on January 18, 2017, with severe right lower quadrant pain.  CT showed distended colon, presumed colitis.  The patient became more lethargic, hypotensive, was taken emergently to the operating room, found to have severe colitis, underwent colostomy and wound was left open.   HPI L>R knee pain  70 year old female with morbid obesity , DM, CHF, CAD , arrythmia with implantable defibrillator who is referred for the evaluation of bilateral knee pain related to osteoarthritis.  The patient has seen both physical medicine rehab Dr. Herma Mering as well as orthopedics Dr. Theda Sers.  Pain is with weight bearing ~17min standing tolerance.  Had a fall last week when power went out but did not injure herself Patient also has a past medical history significant for lumbar spinal stenosis and underwent decompression and fusion of L4-5 and L5-S1 performed by Dr. Venetia Constable on 01/07/2020.  This unfortunately did not help her knee pain but did help some sciatic discomfort Has not tried PT, has tried corticosteroid injection ,  viscosupplementation without benefit, tylenol #3 TID prn helps partially, no bracing  Social history the patient has moved to [place] lives with her daughter now.  She does drive she is dependent with all her self-care and mobility. Pain Inventory Average Pain 5 Pain Right Now 7 My pain is intermittent, stabbing, tingling and aching  In the last 24 hours, has pain interfered with the following? General activity 10 Relation with others 5 Enjoyment of life  10 What TIME of day is your pain at its worst? evening Sleep (in general) Poor  Pain is worse with: walking, bending and standing Pain improves with: rest, medication and ICE Relief from Meds: 8  walk without assistance use a cane how many minutes can you walk? 5 MINS ability to climb steps?  yes do you drive?  yes transfers alone Do you have any goals in this area?  yes  retired  weakness trouble walking  Any changes since last visit?  no New Patient  Any changes since last visit?  no New Patient    Family History  Problem Relation Age of Onset  . Hypertension Father   . Heart disease Father   . Cancer Father        prostate  . Heart disease Other        (Maternal side) Ischemic heart disease  . Diabetes Mellitus II Other   . Arrhythmia Mother   . Diabetes Mellitus II Mother        Borderline DM  . Hypertension Mother   . Asthma Mother   . Cancer Brother   . Dementia Brother   . Hypertension Brother   . Hypertension Daughter   . Hypertension Daughter   . Diabetes Daughter   . Hypertension Daughter   . Atrial fibrillation Daughter   . GER disease Daughter   . Hypertension Son   . Anxiety disorder Son   . Hypothyroidism Son   . Breast cancer Maternal Grandmother    Social History   Socioeconomic History  . Marital status: Widowed    Spouse name: Not on file  .  Number of children: Not on file  . Years of education: Not on file  . Highest education level: Not on file  Occupational History    Comment: retired LPN  Tobacco Use  . Smoking status: Never Smoker  . Smokeless tobacco: Never Used  Vaping Use  . Vaping Use: Never used  Substance and Sexual Activity  . Alcohol use: Never  . Drug use: Never  . Sexual activity: Not Currently  Other Topics Concern  . Not on file  Social History Narrative   Pt lives in [place] alone.  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      As of 03/15/17:   Diet:  1800 Calorie      Caffeine: Yes      Married, if yes what year: Widowed, married 1973      Do you live in a house, apartment, assisted living, condo, trailer, ect: House, 1 stories, and 1 person      Pets: No      Current/Past profession: LPN, retired       Exercise: Yes, walking          Living Will: Yes   DNR: No   POA/HPOA: Yes      Functional Status:   Do you have difficulty bathing or dressing yourself? No   Do you have difficulty preparing food or eating? No   Do you have difficulty managing your medications? No   Do you have difficulty managing your finances? No   Do you have difficulty affording your medications? Yes   Social Determinants of Health   Financial Resource Strain: Not on file  Food Insecurity: Not on file  Transportation Needs: Not on file  Physical Activity: Not on file  Stress: Not on file  Social Connections: Not on file   Past Surgical History:  Procedure Laterality Date  . APPENDECTOMY  07/13/2017  . BLADDER SUSPENSION  1990   "w/hysterectomy"  . BOWEL RESECTION N/A 07/09/2018   Procedure: SMALL BOWEL RESECTION;  Surgeon: Judeth Horn, MD;  Location: [facility];  Service: General;  Laterality: N/A;  . CARDIAC CATHETERIZATION  2005   no obstructive CAD per patient  . CARDIAC CATHETERIZATION  2018  . CARDIAC DEFIBRILLATOR PLACEMENT  2005   BiV ICD implanted,  LV lead is an epicardial lead  . CARPAL TUNNEL RELEASE Left 07/25/2014   Dr.Williamson   . CARPAL TUNNEL RELEASE Right 04/08/2019   Procedure: RIGHT CARPAL TUNNEL RELEASE ENDOSCOPIC;  Surgeon: Milly Jakob, MD;  Location: [facility];  Service: Orthopedics;  Laterality: Right;  . CATARACT EXTRACTION W/ INTRAOCULAR LENS  IMPLANT, BILATERAL Bilateral   . COLONIC STENT PLACEMENT N/A 08/31/2017   Procedure: COLONIC STENT PLACEMENT;  Surgeon: Carol Ada, MD;  Location: [facility];  Service: Endoscopy;  Laterality: N/A;  . COLONOSCOPY     2010-2011 Dr.Kipreos   . COLOSTOMY  12/2016   Archie Endo  01/26/2017  . COLOSTOMY REVERSAL N/A 07/13/2017   Procedure: COLOSTOMY REVERSAL;  Surgeon: Judeth Horn, MD;  Location: [facility];  Service: General;  Laterality: N/A;  . CYST REMOVAL HAND Right 05/2013   thumb  . DILATION AND CURETTAGE OF UTERUS  X 5-6  . ESOPHAGOGASTRODUODENOSCOPY (EGD) WITH PROPOFOL N/A 03/13/2020   Procedure: ESOPHAGOGASTRODUODENOSCOPY (EGD) WITH PROPOFOL;  Surgeon: Carol Ada, MD;  [facility];  Service: Endoscopy;  Laterality: N/A;  . EXCISION MASS ABDOMINAL N/A 07/09/2018   Procedure: EXPLORATION OF  ABDOMINAL WOUND ERAS PATHWAY;  Surgeon: Judeth Horn, MD;  Location: [facility];  Service: General;  Laterality: N/A;  . FLEXIBLE SIGMOIDOSCOPY N/A 08/24/2017   Procedure: Beryle Quant;  Surgeon: Carol Ada, MD;  Location: [facility];  Service: Endoscopy;  Laterality: N/A;  . FLEXIBLE SIGMOIDOSCOPY N/A 08/31/2017   Procedure: FLEXIBLE SIGMOIDOSCOPY;  Surgeon: Carol Ada, MD;  Location: [facility];  Service: Endoscopy;  Laterality: N/A;  stent placement  . FLEXIBLE SIGMOIDOSCOPY N/A 03/13/2020   Procedure: FLEXIBLE SIGMOIDOSCOPY;  Surgeon: Carol Ada, MD;  [facility];  Service: Endoscopy;  Laterality: N/A;  . HEMOSTASIS CLIP PLACEMENT  03/13/2020   Procedure: HEMOSTASIS CLIP PLACEMENT;  Surgeon: Carol Ada, MD;  [facility];  Service: Endoscopy;;  . HOT HEMOSTASIS N/A 03/13/2020   Procedure: HOT HEMOSTASIS (ARGON PLASMA COAGULATION/BICAP);  Surgeon: Carol Ada, MD;  Location: [facility];  Service: Endoscopy;  Laterality: N/A;  . IMPLANTABLE CARDIOVERTER DEFIBRILLATOR GENERATOR CHANGE  2008  . INSERTION OF MESH N/A 07/09/2018   Procedure: INSERTION OF VICRYL MESH;  Surgeon: Judeth Horn, MD;  Location: [facility];  Service: General;  Laterality: N/A;  . LAPAROTOMY N/A 09/18/2017   Procedure: EXPLORATORY LAPAROTOMY;  Surgeon: Judeth Horn, MD;  Location: [facility];  Service: General;  Laterality: N/A;  . LEAD REVISION  06/22/2018  .  LEAD REVISION/REPAIR N/A 06/22/2018   Procedure: LEAD REVISION/REPAIR;  Surgeon: Deboraha Sprang, MD;  Location: [facility];  Service: Cardiovascular;  Laterality: N/A;  . LYSIS OF ADHESION N/A 09/18/2017   Procedure: LYSIS OF ADHESION;  Surgeon: Judeth Horn, MD;  Location: [facility];  Service: General;  Laterality: N/A;  . LYSIS OF ADHESION N/A 07/09/2018   Procedure: LYSIS OF ADHESION;  Surgeon: Judeth Horn, MD;  Location: [facility];  Service: General;  Laterality: N/A;  . PARTIAL COLECTOMY N/A 09/18/2017   Procedure: ILEOCOLECTOMY;  Surgeon: Judeth Horn, MD;  Location: [facility];  Service: General;  Laterality: N/A;  . PARTIAL COLECTOMY  09/25/2017  . POLYPECTOMY  03/13/2020   Procedure: POLYPECTOMY;  Surgeon: Carol Ada, MD;  [facility];  Service: Endoscopy;;  . RIGHT/LEFT HEART CATH AND CORONARY ANGIOGRAPHY N/A 06/01/2018   Procedure: RIGHT/LEFT HEART CATH AND CORONARY ANGIOGRAPHY;  Surgeon: Jettie Booze, MD;  Location: [facility];  Service: Cardiovascular;  Laterality: N/A;  . SIGMOIDOSCOPY N/A 09/18/2017   Procedure: SIGMOIDOSCOPY;  Surgeon: Judeth Horn, MD;  Location: [facility];  Service: General;  Laterality: N/A;  . SMALL INTESTINE SURGERY  07/09/2018   EXPLORATION OF ABDOMINAL WOUND ERAS PATHWAYN/; MESH; LYSIS OF ADHESIONS  . TOTAL ABDOMINAL HYSTERECTOMY  1990  . TRANSFORAMINAL LUMBAR INTERBODY FUSION (TLIF) WITH PEDICLE SCREW FIXATION 2 LEVEL N/A 01/07/2020   Procedure: Lumbar Four-Five and Lumbar Five-Sacral One open lumbar decompression and transforaminal lumbar interbody fusion;  Surgeon: Judith Part, MD;  Location: [facility];  Service: Neurosurgery;  Laterality: N/A;  Lumbar Four-Five and Lumbar Five-Sacral One open lumbar decompression and transforaminal lumbar interbody fusion  . TRIGGER FINGER RELEASE Right 05/213  . TRIGGER FINGER RELEASE Right 04/08/2019   Procedure: RIGHT INDEX RELEASE TRIGGER FINGER/A-1 PULLEY;  Surgeon: Milly Jakob, MD;   Location: [facility];  Service: Orthopedics;  Laterality: Right;  . TUBAL LIGATION  1980s  . VESICOVAGINAL FISTULA CLOSURE W/ TAH     Past Medical History:  Diagnosis Date  . AICD (automatic cardioverter/defibrillator) present    high RV threshold chronically, device was turned off in 2014; Device battery has been dead x 7 years; "turned it back on 06/22/2018"  .  Anemia, iron deficiency   . Angioedema    felt to likely be due to ace inhibitors but says she has had this even off of medicines, appears to be tolerating ARBs chronically  . Anxiety   . Arthritis    "hands, legs, arms; bad in my back" (06/22/2018)  . Asthmatic bronchitis with status asthmaticus   . Bradycardia   . CHF (congestive heart failure) ([place])   . Chronic lower back pain   . Chronic pain   . Chronic renal insufficiency   . CKD (chronic kidney disease), stage III ([place])   . Coronary artery disease    Mild nonobstructive (30% LAD, 30% RCA) by 09/01/16 cath at [facility]  . Degenerative joint disease   . Depression   . Diabetes mellitus, type 2 ([place])   . Diabetic peripheral neuropathy ([place])   . Dizziness   . Dyspnea on exertion   . Family history of adverse reaction to anesthesia    Mother has nausea  . GERD (gastroesophageal reflux disease)   . Gout   . Heart valve disorder   . History of blood transfusion 1981; ~ 2005; 12/2016   "childbirth; defibrillator OR; colostomy OR"  . History of mononucleosis 03/2014  . Hyperlipidemia   . Hypertension   . Hypothyroid   . Insomnia   . Intervertebral disc degeneration   . Ischemic cardiomyopathy   . LBBB (left bundle branch block)   . Myocardial infarction ([place]) dx'd ~ 2005  . Nonischemic cardiomyopathy ([place])    s/p ICD in 2005. EF has since recovered  . Obesity   . On home oxygen therapy    "2L at night" (06/22/2018)  . OSA on CPAP   . Pneumonia    "couple times; last time was 12/2016" (06/22/2018)  . PONV (postoperative nausea and vomiting)   . Presence of  permanent cardiac pacemaker    [employer]  . Swelling   . Syncope   . Systemic hypertension   . Vitamin D deficiency    There were no vitals taken for this visit.  Opioid Risk Score:   Fall Risk Score:  `1  Depression screen PHQ 2/9  Depression screen [facility] 2/9 05/04/2018 10/11/2017 03/15/2017 03/13/2017  Decreased Interest 0 0 0 0  Down, Depressed, Hopeless 0 1 0 0  PHQ - 2 Score 0 1 0 0  Altered sleeping - - - 0  Tired, decreased energy - - - 2  Change in appetite - - - 0  Feeling bad or failure about yourself  - - - 0  Trouble concentrating - - - 0  Moving slowly or fidgety/restless - - - 0  Suicidal thoughts - - - 0  PHQ-9 Score - - - 2   Review of Systems  Musculoskeletal: Positive for back pain and gait problem.       Knee pain  All other systems reviewed and are negative.      Objective:   Physical Exam Vitals and nursing note reviewed.  Constitutional:      Appearance: She is obese.  HENT:     Head: Normocephalic and atraumatic.  Eyes:     Extraocular Movements: Extraocular movements intact.     Conjunctiva/sclera: Conjunctivae normal.     Pupils: Pupils are equal, round, and reactive to light.  Cardiovascular:     Rate and Rhythm: Normal rate and regular rhythm.     Heart sounds: No murmur heard.   Pulmonary:     Effort: Pulmonary effort is  normal.     Breath sounds: Normal breath sounds.     Comments: Left upper chest bulge from defibrillator nontender Abdominal:     General: Abdomen is flat.     Palpations: Abdomen is soft.  Musculoskeletal:     Comments: Left knee no evidence of effusion no erythema there is tenderness along the hamstring insertion medially.  There is no pain with range of motion of the knee there is crepitus noted with knee flexion extension Right knee no evidence of effusion no erythema there is no tenderness along the joint line.  There is no pain with range of motion.  There is mild crepitus  Skin:    General: Skin is  warm and dry.  Neurological:     General: No focal deficit present.     Mental Status: She is alert and oriented to person, place, and time.  Psychiatric:        Mood and Affect: Mood normal.        Behavior: Behavior normal.        Thought Content: Thought content normal.        Judgment: Judgment normal.    Motor strength is 5/5 bilateral deltoid bicep tricep grip hip flexor knee extensor ankle dorsiflexor Negative straight leg raising bilaterally       Assessment & Plan:  #1.  Bilateral knee osteoarthritis more symptomatic on the left side.  Her symptoms limit her standing ability and this impacts on her cooking and other household activities.  Also has difficulty with shopping.  We discussed treatment options including physical therapy.  At this point I do not think the patient would be able to tolerate physical therapy very well because of the pain with standing. The patient did not respond to corticosteroid injections or viscosupplementation, we discussed genicular nerve blocks as a means of blocking painful nerve signals from the knee and how this is different from other injections that she has had thus far.  We also discussed that if test blocks produce a good i.e. greater than or equal to 50% relief, she would be a candidate for radiofrequency ablation of the same nerves. The patient would like to proceed.

## 2020-09-08 DIAGNOSIS — M65322 Trigger finger, left index finger: Secondary | ICD-10-CM | POA: Diagnosis not present

## 2020-09-08 DIAGNOSIS — M65341 Trigger finger, right ring finger: Secondary | ICD-10-CM | POA: Diagnosis not present

## 2020-09-08 DIAGNOSIS — M25531 Pain in right wrist: Secondary | ICD-10-CM | POA: Diagnosis not present

## 2020-09-08 DIAGNOSIS — M25532 Pain in left wrist: Secondary | ICD-10-CM | POA: Diagnosis not present

## 2020-09-10 DIAGNOSIS — G894 Chronic pain syndrome: Secondary | ICD-10-CM | POA: Diagnosis not present

## 2020-09-10 DIAGNOSIS — M25561 Pain in right knee: Secondary | ICD-10-CM | POA: Diagnosis not present

## 2020-09-17 ENCOUNTER — Other Ambulatory Visit: Payer: Self-pay

## 2020-09-17 ENCOUNTER — Encounter: Payer: Self-pay | Admitting: Physical Medicine & Rehabilitation

## 2020-09-17 ENCOUNTER — Other Ambulatory Visit: Payer: Self-pay | Admitting: Internal Medicine

## 2020-09-17 ENCOUNTER — Encounter: Payer: Medicare Other | Attending: Physical Medicine & Rehabilitation | Admitting: Physical Medicine & Rehabilitation

## 2020-09-17 VITALS — BP 103/60 | HR 86 | Temp 98.5°F | Ht 64.0 in | Wt 244.6 lb

## 2020-09-17 DIAGNOSIS — M170 Bilateral primary osteoarthritis of knee: Secondary | ICD-10-CM | POA: Insufficient documentation

## 2020-09-17 DIAGNOSIS — Z1231 Encounter for screening mammogram for malignant neoplasm of breast: Secondary | ICD-10-CM

## 2020-09-17 NOTE — Progress Notes (Signed)
°  [facility]   Name: Crystal [name] DOB:1951/05/02 MRN: [id number]  Date:09/17/2020  Physician: Alysia Penna, MD    Nurse/CMA: Jorja Loa CMA  Allergies:  Allergies  Allergen Reactions   Ace Inhibitors Swelling and Other (See Comments)    Angioedema Other reaction(s): swelling   Glucophage [Metformin Hcl] Other (See Comments)    Renal failure   Telmisartan Other (See Comments)    AVOID ARB/ ACEi in this patient due to recurrent AKI   Advicor [Niacin-Lovastatin Er] Other (See Comments)    Muscle aches   Bystolic [Nebivolol Hcl] Swelling    Whole body swelled   Erythromycin Diarrhea, Nausea And Vomiting and Other (See Comments)    *DERIVATIVES* Other reaction(s): severe vomiting   Lipitor [Atorvastatin] Other (See Comments)    Muscle aches   Lopid [Gemfibrozil] Other (See Comments)    Muscle aches   Statins Other (See Comments)    Muscle aches tolerates crestor   Dilaudid [Hydromorphone] Other (See Comments)   Losartan     Other reaction(s): stopped 12/2018. ? due to worse gfr   Nebivolol Hcl     Other reaction(s): swelling   Other     Other reaction(s): muscle aches   Sacubitril-Valsartan     Other reaction(s): itching   Spironolactone     Other reaction(s): arf    Consent Signed: Yes.    Is patient diabetic? Yes.    CBG today? 118 Pregnant: No. LMP: No LMP recorded. Patient has had a hysterectomy. (age 41-55)  Anticoagulants: no Anti-inflammatory: no Antibiotics: no  Procedure:Genicular Nerve Block of Left Knee  Position: Supine Start Time: 11:51 AM End Time: 12:05 PM  Fluoro Time:48  RN/CMA Teela Narducci MA Nicolo Tomko MA    Time 11:18 am 12:11 am    BP 103/60 102/57    Pulse 86 80    Respirations 16 16    O2 Sat 96 96    S/S 6 6    Pain Level 8/10 0/10     D/C home with Self  , patient A & O X 3, D/C instructions reviewed, and sits independently.

## 2020-09-17 NOTE — Patient Instructions (Signed)
Will perform Right knee genicular nerve block next visit, If the blocks only give temporary relief may do radiofrequency Neurotomy of the same nerves

## 2020-09-17 NOTE — Progress Notes (Signed)
LEFT knee genicular nerve blocks under fluoroscopic guidance  Indication Chronic severe post operative knee pain that has not responded to PT, medication, and other conservative care.  Informed consent was obtained after discussing risks and benefits of procedure with the patient.  These include bleeding, bruising and infection as well as foot numbness. Pt placed in supine position on the fluoro table .  Static images identified distal femur and proximal tibia.  Lateral and medial supracondylar , as well as medial tibial flare prepped with betadine.  Marked under fluoro and then a 25 g 1.5 inch needle was used to anesthetize skin and subcutaneous tissue. 1.5 cc was infiltrate into each of 3 sites. Then a 22-gauge 3.5 inch needle was inserted under fluoroscopic guidance first targeting the medial tibial flare midpoint. After bone contact was made lateral images confirmed proper positioning and Isovue 200 times one ML was injected. This showed no evidence of intravascular uptake. Then 87ml of 2% lidocaine was injected This same procedure was treated for the lateral and medial supracondylar areas. Patient tolerated procedure well. Post procedure instructions given.

## 2020-09-21 DIAGNOSIS — D509 Iron deficiency anemia, unspecified: Secondary | ICD-10-CM | POA: Diagnosis not present

## 2020-09-21 DIAGNOSIS — N1832 Chronic kidney disease, stage 3b: Secondary | ICD-10-CM | POA: Diagnosis not present

## 2020-09-25 ENCOUNTER — Ambulatory Visit (INDEPENDENT_AMBULATORY_CARE_PROVIDER_SITE_OTHER): Payer: Medicare Other

## 2020-09-25 DIAGNOSIS — I428 Other cardiomyopathies: Secondary | ICD-10-CM | POA: Diagnosis not present

## 2020-09-26 DIAGNOSIS — N184 Chronic kidney disease, stage 4 (severe): Secondary | ICD-10-CM | POA: Diagnosis not present

## 2020-09-26 DIAGNOSIS — I447 Left bundle-branch block, unspecified: Secondary | ICD-10-CM | POA: Diagnosis not present

## 2020-09-26 DIAGNOSIS — J45909 Unspecified asthma, uncomplicated: Secondary | ICD-10-CM | POA: Diagnosis not present

## 2020-09-26 DIAGNOSIS — D631 Anemia in chronic kidney disease: Secondary | ICD-10-CM | POA: Diagnosis not present

## 2020-09-26 DIAGNOSIS — K56609 Unspecified intestinal obstruction, unspecified as to partial versus complete obstruction: Secondary | ICD-10-CM | POA: Diagnosis not present

## 2020-09-26 DIAGNOSIS — I2510 Atherosclerotic heart disease of native coronary artery without angina pectoris: Secondary | ICD-10-CM | POA: Diagnosis not present

## 2020-09-26 DIAGNOSIS — F329 Major depressive disorder, single episode, unspecified: Secondary | ICD-10-CM | POA: Diagnosis not present

## 2020-09-26 DIAGNOSIS — Z794 Long term (current) use of insulin: Secondary | ICD-10-CM | POA: Diagnosis not present

## 2020-09-26 DIAGNOSIS — M170 Bilateral primary osteoarthritis of knee: Secondary | ICD-10-CM | POA: Diagnosis not present

## 2020-09-26 DIAGNOSIS — K279 Peptic ulcer, site unspecified, unspecified as acute or chronic, without hemorrhage or perforation: Secondary | ICD-10-CM | POA: Diagnosis not present

## 2020-09-26 DIAGNOSIS — G8929 Other chronic pain: Secondary | ICD-10-CM | POA: Diagnosis not present

## 2020-09-26 DIAGNOSIS — I5022 Chronic systolic (congestive) heart failure: Secondary | ICD-10-CM | POA: Diagnosis not present

## 2020-09-26 DIAGNOSIS — Z9981 Dependence on supplemental oxygen: Secondary | ICD-10-CM | POA: Diagnosis not present

## 2020-09-26 DIAGNOSIS — I130 Hypertensive heart and chronic kidney disease with heart failure and stage 1 through stage 4 chronic kidney disease, or unspecified chronic kidney disease: Secondary | ICD-10-CM | POA: Diagnosis not present

## 2020-09-26 DIAGNOSIS — E1165 Type 2 diabetes mellitus with hyperglycemia: Secondary | ICD-10-CM | POA: Diagnosis not present

## 2020-09-26 DIAGNOSIS — I429 Cardiomyopathy, unspecified: Secondary | ICD-10-CM | POA: Diagnosis not present

## 2020-09-26 DIAGNOSIS — E1122 Type 2 diabetes mellitus with diabetic chronic kidney disease: Secondary | ICD-10-CM | POA: Diagnosis not present

## 2020-09-26 DIAGNOSIS — Z9181 History of falling: Secondary | ICD-10-CM | POA: Diagnosis not present

## 2020-09-28 DIAGNOSIS — M170 Bilateral primary osteoarthritis of knee: Secondary | ICD-10-CM | POA: Diagnosis not present

## 2020-09-28 DIAGNOSIS — N184 Chronic kidney disease, stage 4 (severe): Secondary | ICD-10-CM | POA: Diagnosis not present

## 2020-09-28 DIAGNOSIS — I130 Hypertensive heart and chronic kidney disease with heart failure and stage 1 through stage 4 chronic kidney disease, or unspecified chronic kidney disease: Secondary | ICD-10-CM | POA: Diagnosis not present

## 2020-09-28 DIAGNOSIS — G8929 Other chronic pain: Secondary | ICD-10-CM | POA: Diagnosis not present

## 2020-09-28 DIAGNOSIS — I5022 Chronic systolic (congestive) heart failure: Secondary | ICD-10-CM | POA: Diagnosis not present

## 2020-09-28 DIAGNOSIS — E1122 Type 2 diabetes mellitus with diabetic chronic kidney disease: Secondary | ICD-10-CM | POA: Diagnosis not present

## 2020-09-28 LAB — CUP PACEART REMOTE DEVICE CHECK
Battery Remaining Longevity: 78 mo
Battery Remaining Percentage: 92 %
Brady Statistic RA Percent Paced: 0 %
Brady Statistic RV Percent Paced: 98 %
Date Time Interrogation Session: 20220304132300
HighPow Impedance: 80 Ohm
Implantable Lead Implant Date: 20191129
Implantable Lead Implant Date: 20191129
Implantable Lead Implant Date: 20191129
Implantable Lead Location: 753858
Implantable Lead Location: 753859
Implantable Lead Location: 753860
Implantable Lead Model: 293
Implantable Lead Model: 4086
Implantable Lead Serial Number: 226691
Implantable Lead Serial Number: 440868
Implantable Pulse Generator Implant Date: 20191129
Lead Channel Impedance Value: 490 Ohm
Lead Channel Impedance Value: 635 Ohm
Lead Channel Impedance Value: 887 Ohm
Lead Channel Setting Pacing Amplitude: 2 V
Lead Channel Setting Pacing Amplitude: 2.5 V
Lead Channel Setting Pacing Amplitude: 3 V
Lead Channel Setting Pacing Pulse Width: 0.4 ms
Lead Channel Setting Pacing Pulse Width: 1 ms
Lead Channel Setting Sensing Sensitivity: 0.5 mV
Lead Channel Setting Sensing Sensitivity: 1 mV
Pulse Gen Serial Number: 227627

## 2020-09-29 DIAGNOSIS — D23121 Other benign neoplasm of skin of left upper eyelid, including canthus: Secondary | ICD-10-CM | POA: Diagnosis not present

## 2020-09-29 DIAGNOSIS — H35373 Puckering of macula, bilateral: Secondary | ICD-10-CM | POA: Diagnosis not present

## 2020-09-29 DIAGNOSIS — H52202 Unspecified astigmatism, left eye: Secondary | ICD-10-CM | POA: Diagnosis not present

## 2020-09-29 DIAGNOSIS — D23111 Other benign neoplasm of skin of right upper eyelid, including canthus: Secondary | ICD-10-CM | POA: Diagnosis not present

## 2020-09-29 DIAGNOSIS — E119 Type 2 diabetes mellitus without complications: Secondary | ICD-10-CM | POA: Diagnosis not present

## 2020-10-02 DIAGNOSIS — E559 Vitamin D deficiency, unspecified: Secondary | ICD-10-CM | POA: Diagnosis not present

## 2020-10-02 DIAGNOSIS — E1122 Type 2 diabetes mellitus with diabetic chronic kidney disease: Secondary | ICD-10-CM | POA: Diagnosis not present

## 2020-10-02 DIAGNOSIS — I5042 Chronic combined systolic (congestive) and diastolic (congestive) heart failure: Secondary | ICD-10-CM | POA: Diagnosis not present

## 2020-10-02 DIAGNOSIS — N1832 Chronic kidney disease, stage 3b: Secondary | ICD-10-CM | POA: Diagnosis not present

## 2020-10-02 DIAGNOSIS — D509 Iron deficiency anemia, unspecified: Secondary | ICD-10-CM | POA: Diagnosis not present

## 2020-10-02 DIAGNOSIS — I129 Hypertensive chronic kidney disease with stage 1 through stage 4 chronic kidney disease, or unspecified chronic kidney disease: Secondary | ICD-10-CM | POA: Diagnosis not present

## 2020-10-06 DIAGNOSIS — M170 Bilateral primary osteoarthritis of knee: Secondary | ICD-10-CM | POA: Diagnosis not present

## 2020-10-06 DIAGNOSIS — I5022 Chronic systolic (congestive) heart failure: Secondary | ICD-10-CM | POA: Diagnosis not present

## 2020-10-06 DIAGNOSIS — G8929 Other chronic pain: Secondary | ICD-10-CM | POA: Diagnosis not present

## 2020-10-06 DIAGNOSIS — N184 Chronic kidney disease, stage 4 (severe): Secondary | ICD-10-CM | POA: Diagnosis not present

## 2020-10-06 DIAGNOSIS — E1122 Type 2 diabetes mellitus with diabetic chronic kidney disease: Secondary | ICD-10-CM | POA: Diagnosis not present

## 2020-10-06 DIAGNOSIS — I130 Hypertensive heart and chronic kidney disease with heart failure and stage 1 through stage 4 chronic kidney disease, or unspecified chronic kidney disease: Secondary | ICD-10-CM | POA: Diagnosis not present

## 2020-10-06 NOTE — Progress Notes (Signed)
Remote ICD transmission.   

## 2020-10-09 DIAGNOSIS — M170 Bilateral primary osteoarthritis of knee: Secondary | ICD-10-CM | POA: Diagnosis not present

## 2020-10-09 DIAGNOSIS — G8929 Other chronic pain: Secondary | ICD-10-CM | POA: Diagnosis not present

## 2020-10-09 DIAGNOSIS — N184 Chronic kidney disease, stage 4 (severe): Secondary | ICD-10-CM | POA: Diagnosis not present

## 2020-10-09 DIAGNOSIS — I130 Hypertensive heart and chronic kidney disease with heart failure and stage 1 through stage 4 chronic kidney disease, or unspecified chronic kidney disease: Secondary | ICD-10-CM | POA: Diagnosis not present

## 2020-10-09 DIAGNOSIS — E1122 Type 2 diabetes mellitus with diabetic chronic kidney disease: Secondary | ICD-10-CM | POA: Diagnosis not present

## 2020-10-09 DIAGNOSIS — I5022 Chronic systolic (congestive) heart failure: Secondary | ICD-10-CM | POA: Diagnosis not present

## 2020-10-12 DIAGNOSIS — M170 Bilateral primary osteoarthritis of knee: Secondary | ICD-10-CM | POA: Diagnosis not present

## 2020-10-12 DIAGNOSIS — I5022 Chronic systolic (congestive) heart failure: Secondary | ICD-10-CM | POA: Diagnosis not present

## 2020-10-12 DIAGNOSIS — E1122 Type 2 diabetes mellitus with diabetic chronic kidney disease: Secondary | ICD-10-CM | POA: Diagnosis not present

## 2020-10-12 DIAGNOSIS — G8929 Other chronic pain: Secondary | ICD-10-CM | POA: Diagnosis not present

## 2020-10-12 DIAGNOSIS — I130 Hypertensive heart and chronic kidney disease with heart failure and stage 1 through stage 4 chronic kidney disease, or unspecified chronic kidney disease: Secondary | ICD-10-CM | POA: Diagnosis not present

## 2020-10-12 DIAGNOSIS — Z23 Encounter for immunization: Secondary | ICD-10-CM | POA: Diagnosis not present

## 2020-10-12 DIAGNOSIS — N184 Chronic kidney disease, stage 4 (severe): Secondary | ICD-10-CM | POA: Diagnosis not present

## 2020-10-13 DIAGNOSIS — G894 Chronic pain syndrome: Secondary | ICD-10-CM | POA: Diagnosis not present

## 2020-10-16 DIAGNOSIS — N1832 Chronic kidney disease, stage 3b: Secondary | ICD-10-CM | POA: Diagnosis not present

## 2020-10-20 DIAGNOSIS — D23121 Other benign neoplasm of skin of left upper eyelid, including canthus: Secondary | ICD-10-CM | POA: Diagnosis not present

## 2020-10-20 DIAGNOSIS — D23111 Other benign neoplasm of skin of right upper eyelid, including canthus: Secondary | ICD-10-CM | POA: Diagnosis not present

## 2020-10-20 DIAGNOSIS — D23122 Other benign neoplasm of skin of left lower eyelid, including canthus: Secondary | ICD-10-CM | POA: Diagnosis not present

## 2020-10-20 DIAGNOSIS — D23112 Other benign neoplasm of skin of right lower eyelid, including canthus: Secondary | ICD-10-CM | POA: Diagnosis not present

## 2020-10-22 ENCOUNTER — Encounter: Payer: Self-pay | Admitting: Physical Medicine & Rehabilitation

## 2020-10-22 ENCOUNTER — Other Ambulatory Visit: Payer: Self-pay

## 2020-10-22 ENCOUNTER — Encounter: Payer: Medicare Other | Attending: Physical Medicine & Rehabilitation | Admitting: Physical Medicine & Rehabilitation

## 2020-10-22 VITALS — BP 92/56 | HR 76 | Temp 98.4°F | Ht 64.0 in | Wt 245.2 lb

## 2020-10-22 DIAGNOSIS — M170 Bilateral primary osteoarthritis of knee: Secondary | ICD-10-CM | POA: Diagnosis not present

## 2020-10-22 NOTE — Progress Notes (Signed)
  PROCEDURE RECORD [facility]   Name: Crystal [name] DOB:1951/05/07 MRN: [id number]  Date:10/22/2020  Physician: Alysia Penna, MD    Nurse/CMA: Betti Cruz  Allergies:  Allergies  Allergen Reactions  . Ace Inhibitors Swelling and Other (See Comments)    Angioedema Other reaction(s): swelling  . Glucophage [Metformin Hcl] Other (See Comments)    Renal failure  . Telmisartan Other (See Comments)    AVOID ARB/ ACEi in this patient due to recurrent AKI  . Advicor [Niacin-Lovastatin Er] Other (See Comments)    Muscle aches  . Bystolic [Nebivolol Hcl] Swelling    Whole body swelled  . Erythromycin Diarrhea, Nausea And Vomiting and Other (See Comments)    *DERIVATIVES* Other reaction(s): severe vomiting  . Lipitor [Atorvastatin] Other (See Comments)    Muscle aches  . Lopid [Gemfibrozil] Other (See Comments)    Muscle aches  . Statins Other (See Comments)    Muscle aches tolerates crestor  . Dilaudid [Hydromorphone] Other (See Comments)  . Losartan     Other reaction(s): stopped 12/2018. ? due to worse gfr  . Nebivolol Hcl     Other reaction(s): swelling  . Other     Other reaction(s): muscle aches  . Sacubitril-Valsartan     Other reaction(s): itching  . Spironolactone     Other reaction(s): arf    Consent Signed: Yes.    Is patient diabetic? Yes.    CBG today? 109 Pregnant: No. LMP: No LMP recorded. Patient has had a hysterectomy. (age [age])  Anticoagulants: no Anti-inflammatory: no Antibiotics: no  Procedure: Right Knee Genicular Nerve Block Position: Prone Start Time: 1053am End Time:11:07am  Fluoro Time: 53  RN/CMA Truman Hayward, CMA Daryus Sowash, CMA    Time 10:18am 11:13am    BP 92/56 117/67    Pulse 76 79    Respirations 16 16    O2 Sat 92 92    S/S 6 6    Pain Level 8/10 0/10     D/C home with no one, patient A & O X 3, D/C instructions reviewed, and sits independently.

## 2020-10-22 NOTE — Patient Instructions (Addendum)
Genicular nerve blocks were performed today. This is to block pain signals from the knee. A local anesthetic was used to block the knee therefore this will not be a permanent procedure. Please keep track of your pain today and compared to the pain that you had prior to the injection. At next visit we will discuss the results. If it is helpful but only short-term we would need to confirm this with an additional injection prior to proceeding with a radiofrequency neurotomy   Please call if injections wear off prior to next visit

## 2020-10-22 NOTE — Progress Notes (Signed)
Right genicular nerve blocks under fluoroscopic guidance  Indication Chronic severe Right  knee pain that has not responded to PT, medication, and other conservative care.  Informed consent was obtained after discussing risks and benefits of procedure with the patient.  These include bleeding, bruising and infection as well as foot numbness. Pt placed in supine position on the fluoro table .  Static images identified distal femur and proximal tibia.  Lateral and medial supracondylar , as well as medial tibial flare prepped with betadine.  Marked under fluoro and then a 25 g 1.5 inch needle was used to anesthetize skin and subcutaneous tissue. 1.5 cc was infiltrate into each of 3 sites. Then a 22-gauge 3.5 inch needle was inserted under fluoroscopic guidance first targeting the medial tibial flare midpoint. After bone contact was made lateral images confirmed proper positioning and Isovue 200 times one ML was injected. This showed no evidence of intravascular uptake. Then 1.5 ML of the solution containing 2% lidocaine  was injected. This same procedure was treated for the lateral and medial supracondylar areas. Patient tolerated procedure well. Post procedure instructions given.

## 2020-10-26 DIAGNOSIS — E1122 Type 2 diabetes mellitus with diabetic chronic kidney disease: Secondary | ICD-10-CM | POA: Diagnosis not present

## 2020-10-26 DIAGNOSIS — E1165 Type 2 diabetes mellitus with hyperglycemia: Secondary | ICD-10-CM | POA: Diagnosis not present

## 2020-10-26 DIAGNOSIS — I130 Hypertensive heart and chronic kidney disease with heart failure and stage 1 through stage 4 chronic kidney disease, or unspecified chronic kidney disease: Secondary | ICD-10-CM | POA: Diagnosis not present

## 2020-10-26 DIAGNOSIS — I447 Left bundle-branch block, unspecified: Secondary | ICD-10-CM | POA: Diagnosis not present

## 2020-10-26 DIAGNOSIS — I5022 Chronic systolic (congestive) heart failure: Secondary | ICD-10-CM | POA: Diagnosis not present

## 2020-10-26 DIAGNOSIS — F329 Major depressive disorder, single episode, unspecified: Secondary | ICD-10-CM | POA: Diagnosis not present

## 2020-10-26 DIAGNOSIS — Z9181 History of falling: Secondary | ICD-10-CM | POA: Diagnosis not present

## 2020-10-26 DIAGNOSIS — Z794 Long term (current) use of insulin: Secondary | ICD-10-CM | POA: Diagnosis not present

## 2020-10-26 DIAGNOSIS — K279 Peptic ulcer, site unspecified, unspecified as acute or chronic, without hemorrhage or perforation: Secondary | ICD-10-CM | POA: Diagnosis not present

## 2020-10-26 DIAGNOSIS — K56609 Unspecified intestinal obstruction, unspecified as to partial versus complete obstruction: Secondary | ICD-10-CM | POA: Diagnosis not present

## 2020-10-26 DIAGNOSIS — N184 Chronic kidney disease, stage 4 (severe): Secondary | ICD-10-CM | POA: Diagnosis not present

## 2020-10-26 DIAGNOSIS — I429 Cardiomyopathy, unspecified: Secondary | ICD-10-CM | POA: Diagnosis not present

## 2020-10-26 DIAGNOSIS — G8929 Other chronic pain: Secondary | ICD-10-CM | POA: Diagnosis not present

## 2020-10-26 DIAGNOSIS — D631 Anemia in chronic kidney disease: Secondary | ICD-10-CM | POA: Diagnosis not present

## 2020-10-26 DIAGNOSIS — J45909 Unspecified asthma, uncomplicated: Secondary | ICD-10-CM | POA: Diagnosis not present

## 2020-10-26 DIAGNOSIS — Z9981 Dependence on supplemental oxygen: Secondary | ICD-10-CM | POA: Diagnosis not present

## 2020-10-26 DIAGNOSIS — M170 Bilateral primary osteoarthritis of knee: Secondary | ICD-10-CM | POA: Diagnosis not present

## 2020-10-26 DIAGNOSIS — I2510 Atherosclerotic heart disease of native coronary artery without angina pectoris: Secondary | ICD-10-CM | POA: Diagnosis not present

## 2020-10-27 DIAGNOSIS — D631 Anemia in chronic kidney disease: Secondary | ICD-10-CM | POA: Diagnosis not present

## 2020-10-27 DIAGNOSIS — N1832 Chronic kidney disease, stage 3b: Secondary | ICD-10-CM | POA: Diagnosis not present

## 2020-11-03 DIAGNOSIS — I130 Hypertensive heart and chronic kidney disease with heart failure and stage 1 through stage 4 chronic kidney disease, or unspecified chronic kidney disease: Secondary | ICD-10-CM | POA: Diagnosis not present

## 2020-11-03 DIAGNOSIS — N1832 Chronic kidney disease, stage 3b: Secondary | ICD-10-CM | POA: Diagnosis not present

## 2020-11-03 DIAGNOSIS — M170 Bilateral primary osteoarthritis of knee: Secondary | ICD-10-CM | POA: Diagnosis not present

## 2020-11-03 DIAGNOSIS — N184 Chronic kidney disease, stage 4 (severe): Secondary | ICD-10-CM | POA: Diagnosis not present

## 2020-11-03 DIAGNOSIS — D631 Anemia in chronic kidney disease: Secondary | ICD-10-CM | POA: Diagnosis not present

## 2020-11-03 DIAGNOSIS — I5022 Chronic systolic (congestive) heart failure: Secondary | ICD-10-CM | POA: Diagnosis not present

## 2020-11-03 DIAGNOSIS — E1122 Type 2 diabetes mellitus with diabetic chronic kidney disease: Secondary | ICD-10-CM | POA: Diagnosis not present

## 2020-11-03 DIAGNOSIS — G8929 Other chronic pain: Secondary | ICD-10-CM | POA: Diagnosis not present

## 2020-11-04 DIAGNOSIS — N1832 Chronic kidney disease, stage 3b: Secondary | ICD-10-CM | POA: Diagnosis not present

## 2020-11-06 ENCOUNTER — Other Ambulatory Visit: Payer: Self-pay

## 2020-11-06 ENCOUNTER — Ambulatory Visit
Admission: RE | Admit: 2020-11-06 | Discharge: 2020-11-06 | Disposition: A | Discharge status: Home / Self Care | Payer: Medicare Other | Source: Ambulatory Visit | Attending: Internal Medicine | Admitting: Internal Medicine

## 2020-11-06 DIAGNOSIS — Z1231 Encounter for screening mammogram for malignant neoplasm of breast: Secondary | ICD-10-CM

## 2020-11-16 DIAGNOSIS — E785 Hyperlipidemia, unspecified: Secondary | ICD-10-CM | POA: Diagnosis not present

## 2020-11-16 DIAGNOSIS — M109 Gout, unspecified: Secondary | ICD-10-CM | POA: Diagnosis not present

## 2020-11-16 DIAGNOSIS — E1129 Type 2 diabetes mellitus with other diabetic kidney complication: Secondary | ICD-10-CM | POA: Diagnosis not present

## 2020-11-17 DIAGNOSIS — Z79899 Other long term (current) drug therapy: Secondary | ICD-10-CM | POA: Diagnosis not present

## 2020-11-17 DIAGNOSIS — G894 Chronic pain syndrome: Secondary | ICD-10-CM | POA: Diagnosis not present

## 2020-11-17 DIAGNOSIS — Z5181 Encounter for therapeutic drug level monitoring: Secondary | ICD-10-CM | POA: Diagnosis not present

## 2020-11-23 DIAGNOSIS — N184 Chronic kidney disease, stage 4 (severe): Secondary | ICD-10-CM | POA: Diagnosis not present

## 2020-11-23 DIAGNOSIS — K921 Melena: Secondary | ICD-10-CM | POA: Diagnosis not present

## 2020-11-23 DIAGNOSIS — Z794 Long term (current) use of insulin: Secondary | ICD-10-CM | POA: Diagnosis not present

## 2020-11-23 DIAGNOSIS — I130 Hypertensive heart and chronic kidney disease with heart failure and stage 1 through stage 4 chronic kidney disease, or unspecified chronic kidney disease: Secondary | ICD-10-CM | POA: Diagnosis not present

## 2020-11-23 DIAGNOSIS — K922 Gastrointestinal hemorrhage, unspecified: Secondary | ICD-10-CM | POA: Diagnosis not present

## 2020-11-23 DIAGNOSIS — I10 Essential (primary) hypertension: Secondary | ICD-10-CM | POA: Diagnosis not present

## 2020-11-23 DIAGNOSIS — E1129 Type 2 diabetes mellitus with other diabetic kidney complication: Secondary | ICD-10-CM | POA: Diagnosis not present

## 2020-11-23 DIAGNOSIS — I509 Heart failure, unspecified: Secondary | ICD-10-CM | POA: Diagnosis not present

## 2020-11-23 DIAGNOSIS — E785 Hyperlipidemia, unspecified: Secondary | ICD-10-CM | POA: Diagnosis not present

## 2020-11-23 DIAGNOSIS — G4733 Obstructive sleep apnea (adult) (pediatric): Secondary | ICD-10-CM | POA: Diagnosis not present

## 2020-11-23 DIAGNOSIS — R82998 Other abnormal findings in urine: Secondary | ICD-10-CM | POA: Diagnosis not present

## 2020-11-23 DIAGNOSIS — E1159 Type 2 diabetes mellitus with other circulatory complications: Secondary | ICD-10-CM | POA: Diagnosis not present

## 2020-11-23 DIAGNOSIS — Z0000 Encounter for general adult medical examination without abnormal findings: Secondary | ICD-10-CM | POA: Diagnosis not present

## 2020-11-23 DIAGNOSIS — D62 Acute posthemorrhagic anemia: Secondary | ICD-10-CM | POA: Diagnosis not present

## 2020-11-25 DIAGNOSIS — N1832 Chronic kidney disease, stage 3b: Secondary | ICD-10-CM | POA: Diagnosis not present

## 2020-11-25 DIAGNOSIS — I5042 Chronic combined systolic (congestive) and diastolic (congestive) heart failure: Secondary | ICD-10-CM | POA: Diagnosis not present

## 2020-11-25 DIAGNOSIS — D509 Iron deficiency anemia, unspecified: Secondary | ICD-10-CM | POA: Diagnosis not present

## 2020-11-25 DIAGNOSIS — N179 Acute kidney failure, unspecified: Secondary | ICD-10-CM | POA: Diagnosis not present

## 2020-11-25 DIAGNOSIS — I129 Hypertensive chronic kidney disease with stage 1 through stage 4 chronic kidney disease, or unspecified chronic kidney disease: Secondary | ICD-10-CM | POA: Diagnosis not present

## 2020-11-25 DIAGNOSIS — E1122 Type 2 diabetes mellitus with diabetic chronic kidney disease: Secondary | ICD-10-CM | POA: Diagnosis not present

## 2020-11-25 DIAGNOSIS — E559 Vitamin D deficiency, unspecified: Secondary | ICD-10-CM | POA: Diagnosis not present

## 2020-11-30 ENCOUNTER — Other Ambulatory Visit: Payer: Self-pay | Admitting: Gastroenterology

## 2020-11-30 DIAGNOSIS — N289 Disorder of kidney and ureter, unspecified: Secondary | ICD-10-CM | POA: Diagnosis not present

## 2020-11-30 DIAGNOSIS — Z980 Intestinal bypass and anastomosis status: Secondary | ICD-10-CM | POA: Diagnosis not present

## 2020-11-30 DIAGNOSIS — D509 Iron deficiency anemia, unspecified: Secondary | ICD-10-CM | POA: Diagnosis not present

## 2020-11-30 DIAGNOSIS — R195 Other fecal abnormalities: Secondary | ICD-10-CM | POA: Diagnosis not present

## 2020-12-02 DIAGNOSIS — N179 Acute kidney failure, unspecified: Secondary | ICD-10-CM | POA: Diagnosis not present

## 2020-12-08 DIAGNOSIS — I5042 Chronic combined systolic (congestive) and diastolic (congestive) heart failure: Secondary | ICD-10-CM | POA: Diagnosis not present

## 2020-12-08 DIAGNOSIS — E559 Vitamin D deficiency, unspecified: Secondary | ICD-10-CM | POA: Diagnosis not present

## 2020-12-08 DIAGNOSIS — N179 Acute kidney failure, unspecified: Secondary | ICD-10-CM | POA: Diagnosis not present

## 2020-12-08 DIAGNOSIS — E1122 Type 2 diabetes mellitus with diabetic chronic kidney disease: Secondary | ICD-10-CM | POA: Diagnosis not present

## 2020-12-08 DIAGNOSIS — N1832 Chronic kidney disease, stage 3b: Secondary | ICD-10-CM | POA: Diagnosis not present

## 2020-12-08 DIAGNOSIS — D509 Iron deficiency anemia, unspecified: Secondary | ICD-10-CM | POA: Diagnosis not present

## 2020-12-08 DIAGNOSIS — I129 Hypertensive chronic kidney disease with stage 1 through stage 4 chronic kidney disease, or unspecified chronic kidney disease: Secondary | ICD-10-CM | POA: Diagnosis not present

## 2020-12-14 ENCOUNTER — Encounter (HOSPITAL_COMMUNITY): Payer: Self-pay | Admitting: Gastroenterology

## 2020-12-14 ENCOUNTER — Other Ambulatory Visit: Payer: Self-pay

## 2020-12-14 NOTE — Progress Notes (Signed)
Attempted to obtain medical history via telephone, unable to reach at this time. I left a voicemail to return pre surgical testing department's phone call.  

## 2020-12-18 ENCOUNTER — Encounter (HOSPITAL_COMMUNITY): Payer: Self-pay | Admitting: Gastroenterology

## 2020-12-18 ENCOUNTER — Other Ambulatory Visit: Payer: Self-pay

## 2020-12-18 ENCOUNTER — Ambulatory Visit (HOSPITAL_COMMUNITY): Payer: Medicare Other | Admitting: Anesthesiology

## 2020-12-18 ENCOUNTER — Encounter (HOSPITAL_COMMUNITY)
Admission: RE | Disposition: A | Discharge status: Home / Self Care | Payer: Self-pay | Source: Home / Self Care | Attending: Gastroenterology

## 2020-12-18 ENCOUNTER — Ambulatory Visit (HOSPITAL_COMMUNITY)
Admission: RE | Admit: 2020-12-18 | Discharge: 2020-12-18 | Disposition: A | Discharge status: Home / Self Care | Payer: Medicare Other | Attending: Gastroenterology | Admitting: Gastroenterology

## 2020-12-18 DIAGNOSIS — I509 Heart failure, unspecified: Secondary | ICD-10-CM | POA: Diagnosis not present

## 2020-12-18 DIAGNOSIS — E1140 Type 2 diabetes mellitus with diabetic neuropathy, unspecified: Secondary | ICD-10-CM | POA: Diagnosis not present

## 2020-12-18 DIAGNOSIS — Z980 Intestinal bypass and anastomosis status: Secondary | ICD-10-CM | POA: Insufficient documentation

## 2020-12-18 DIAGNOSIS — Z803 Family history of malignant neoplasm of breast: Secondary | ICD-10-CM | POA: Diagnosis not present

## 2020-12-18 DIAGNOSIS — K5520 Angiodysplasia of colon without hemorrhage: Secondary | ICD-10-CM | POA: Diagnosis not present

## 2020-12-18 DIAGNOSIS — Z888 Allergy status to other drugs, medicaments and biological substances status: Secondary | ICD-10-CM | POA: Insufficient documentation

## 2020-12-18 DIAGNOSIS — K5521 Angiodysplasia of colon with hemorrhage: Secondary | ICD-10-CM | POA: Diagnosis not present

## 2020-12-18 DIAGNOSIS — Z9981 Dependence on supplemental oxygen: Secondary | ICD-10-CM | POA: Diagnosis not present

## 2020-12-18 DIAGNOSIS — Z825 Family history of asthma and other chronic lower respiratory diseases: Secondary | ICD-10-CM | POA: Diagnosis not present

## 2020-12-18 DIAGNOSIS — K921 Melena: Secondary | ICD-10-CM | POA: Diagnosis not present

## 2020-12-18 DIAGNOSIS — E1142 Type 2 diabetes mellitus with diabetic polyneuropathy: Secondary | ICD-10-CM | POA: Insufficient documentation

## 2020-12-18 DIAGNOSIS — E039 Hypothyroidism, unspecified: Secondary | ICD-10-CM | POA: Diagnosis not present

## 2020-12-18 DIAGNOSIS — I252 Old myocardial infarction: Secondary | ICD-10-CM | POA: Diagnosis not present

## 2020-12-18 DIAGNOSIS — E875 Hyperkalemia: Secondary | ICD-10-CM | POA: Diagnosis not present

## 2020-12-18 DIAGNOSIS — Z8379 Family history of other diseases of the digestive system: Secondary | ICD-10-CM | POA: Diagnosis not present

## 2020-12-18 DIAGNOSIS — K219 Gastro-esophageal reflux disease without esophagitis: Secondary | ICD-10-CM | POA: Diagnosis not present

## 2020-12-18 DIAGNOSIS — Z95810 Presence of automatic (implantable) cardiac defibrillator: Secondary | ICD-10-CM | POA: Insufficient documentation

## 2020-12-18 DIAGNOSIS — Z881 Allergy status to other antibiotic agents status: Secondary | ICD-10-CM | POA: Diagnosis not present

## 2020-12-18 DIAGNOSIS — N183 Chronic kidney disease, stage 3 unspecified: Secondary | ICD-10-CM | POA: Insufficient documentation

## 2020-12-18 DIAGNOSIS — Z8349 Family history of other endocrine, nutritional and metabolic diseases: Secondary | ICD-10-CM | POA: Diagnosis not present

## 2020-12-18 DIAGNOSIS — Z79899 Other long term (current) drug therapy: Secondary | ICD-10-CM | POA: Insufficient documentation

## 2020-12-18 DIAGNOSIS — Z8042 Family history of malignant neoplasm of prostate: Secondary | ICD-10-CM | POA: Insufficient documentation

## 2020-12-18 DIAGNOSIS — Z809 Family history of malignant neoplasm, unspecified: Secondary | ICD-10-CM | POA: Diagnosis not present

## 2020-12-18 DIAGNOSIS — I447 Left bundle-branch block, unspecified: Secondary | ICD-10-CM | POA: Diagnosis not present

## 2020-12-18 DIAGNOSIS — I130 Hypertensive heart and chronic kidney disease with heart failure and stage 1 through stage 4 chronic kidney disease, or unspecified chronic kidney disease: Secondary | ICD-10-CM | POA: Insufficient documentation

## 2020-12-18 DIAGNOSIS — Z8249 Family history of ischemic heart disease and other diseases of the circulatory system: Secondary | ICD-10-CM | POA: Insufficient documentation

## 2020-12-18 DIAGNOSIS — E1122 Type 2 diabetes mellitus with diabetic chronic kidney disease: Secondary | ICD-10-CM | POA: Diagnosis not present

## 2020-12-18 DIAGNOSIS — Z833 Family history of diabetes mellitus: Secondary | ICD-10-CM | POA: Insufficient documentation

## 2020-12-18 DIAGNOSIS — Z885 Allergy status to narcotic agent status: Secondary | ICD-10-CM | POA: Diagnosis not present

## 2020-12-18 DIAGNOSIS — I428 Other cardiomyopathies: Secondary | ICD-10-CM | POA: Diagnosis not present

## 2020-12-18 HISTORY — PX: HEMOSTASIS CLIP PLACEMENT: SHX6857

## 2020-12-18 HISTORY — PX: FLEXIBLE SIGMOIDOSCOPY: SHX5431

## 2020-12-18 HISTORY — PX: HOT HEMOSTASIS: SHX5433

## 2020-12-18 LAB — GLUCOSE, CAPILLARY
Glucose-Capillary: 211 mg/dL — ABNORMAL HIGH (ref 70–99)
Glucose-Capillary: 219 mg/dL — ABNORMAL HIGH (ref 70–99)

## 2020-12-18 SURGERY — SIGMOIDOSCOPY, FLEXIBLE
Anesthesia: Monitor Anesthesia Care

## 2020-12-18 MED ORDER — SODIUM CHLORIDE 0.9 % IV SOLN: INTRAVENOUS | Status: DC

## 2020-12-18 MED ORDER — PROPOFOL 10 MG/ML IV BOLUS
INTRAVENOUS | Status: DC | PRN
  Administered 2020-12-18 (×2): 20 mg via INTRAVENOUS

## 2020-12-18 MED ORDER — INSULIN ASPART 100 UNIT/ML IJ SOLN
INTRAMUSCULAR | Status: AC
  Filled 2020-12-18: qty 1

## 2020-12-18 MED ORDER — PROPOFOL 10 MG/ML IV BOLUS
INTRAVENOUS | Status: AC
  Filled 2020-12-18: qty 20

## 2020-12-18 MED ORDER — INSULIN ASPART 100 UNIT/ML IJ SOLN
3.0000 [IU] | Freq: Once | INTRAMUSCULAR | Status: AC
  Administered 2020-12-18: 3 [IU] via SUBCUTANEOUS

## 2020-12-18 MED ORDER — PROPOFOL 500 MG/50ML IV EMUL
INTRAVENOUS | Status: DC | PRN
  Administered 2020-12-18: 125 ug/kg/min via INTRAVENOUS

## 2020-12-18 MED ORDER — LIDOCAINE 2% (20 MG/ML) 5 ML SYRINGE
INTRAMUSCULAR | Status: DC | PRN
  Administered 2020-12-18: 100 mg via INTRAVENOUS

## 2020-12-18 MED ORDER — PHENYLEPHRINE 40 MCG/ML (10ML) SYRINGE FOR IV PUSH (FOR BLOOD PRESSURE SUPPORT)
PREFILLED_SYRINGE | INTRAVENOUS | Status: DC | PRN
  Administered 2020-12-18: 80 ug via INTRAVENOUS

## 2020-12-18 MED ORDER — PROPOFOL 500 MG/50ML IV EMUL
INTRAVENOUS | Status: AC
  Filled 2020-12-18: qty 50

## 2020-12-18 NOTE — Anesthesia Preprocedure Evaluation (Signed)
Anesthesia Evaluation  Patient identified by MRN, date of birth, ID band Patient awake    Reviewed: Allergy & Precautions, NPO status , Patient's Chart, lab work & pertinent test results, reviewed documented beta blocker date and time   History of Anesthesia Complications (+) PONV and history of anesthetic complications  Airway Mallampati: III  TM Distance: >3 FB Neck ROM: Full    Dental  (+) Edentulous Upper, Edentulous Lower   Pulmonary asthma , sleep apnea and Oxygen sleep apnea ,    Pulmonary exam normal        Cardiovascular hypertension, Pt. on medications and Pt. on home beta blockers + CAD, + Past MI and +CHF  Normal cardiovascular exam+ pacemaker + Cardiac Defibrillator   TTE 2021: EF 99-24%, grade 1 diastolic dysfunction, moderate LVH, mild RVE    Neuro/Psych Anxiety Depression negative neurological ROS     GI/Hepatic Neg liver ROS, GERD  Medicated and Controlled,  Endo/Other  diabetes, Type 2, Insulin DependentHypothyroidism   Renal/GU Renal InsufficiencyRenal disease  negative genitourinary   Musculoskeletal  (+) Arthritis ,   Abdominal   Peds  Hematology  (+) anemia ,   Anesthesia Other Findings Day of surgery medications reviewed with patient.  Reproductive/Obstetrics negative OB ROS                             Anesthesia Physical Anesthesia Plan  ASA: III  Anesthesia Plan: MAC   Post-op Pain Management:    Induction:   PONV Risk Score and Plan: Treatment may vary due to age or medical condition and Propofol infusion  Airway Management Planned: Natural Airway and Nasal Cannula  Additional Equipment: None  Intra-op Plan:   Post-operative Plan:   Informed Consent: I have reviewed the patients History and Physical, chart, labs and discussed the procedure including the risks, benefits and alternatives for the proposed anesthesia with the patient or authorized  representative who has indicated his/her understanding and acceptance.       Plan Discussed with: CRNA  Anesthesia Plan Comments:         Anesthesia Quick Evaluation

## 2020-12-18 NOTE — Transfer of Care (Signed)
Immediate Anesthesia Transfer of Care Note  Patient: Crystal [name]  Procedure(s) Performed: FLEXIBLE SIGMOIDOSCOPY (N/A ) HOT HEMOSTASIS (ARGON PLASMA COAGULATION/BICAP) (N/A ) HEMOSTASIS CLIP PLACEMENT  Patient Location: Endoscopy Unit  Anesthesia Type:MAC  Level of Consciousness: awake  Airway & Oxygen Therapy: Patient Spontanous Breathing and Patient connected to face mask oxygen  Post-op Assessment: Report given to RN and Post -op Vital signs reviewed and stable  Post vital signs: Reviewed and stable  Last Vitals:  Vitals Value Taken Time  BP    Temp    Pulse    Resp    SpO2      Last Pain:  Vitals:   12/18/20 0833  TempSrc: Oral  PainSc: 8       Patients Stated Pain Goal: 8 (90/38/33 3832)  Complications: No complications documented.

## 2020-12-18 NOTE — Consult Note (Signed)
Crystal [name]   HPI: Her most recent HGB on 11/16/2020 showed that her HGB was at 10.0 g/dL. The HGB from 03/2020 was at 11.2 g/dL. [facility] ln 03/13/2020 was positive for a bleeding vascular lesion and it was treated with APC as well as hemoclipping. She does state that she is seeing the blood in her stool routinely. It is not a significant amount. Her last iron infusion was this past April.   Past Medical History:  Diagnosis Date  . AICD (automatic cardioverter/defibrillator) present    high RV threshold chronically, device was turned off in 2014; Device battery has been dead x 7 years; "turned it back on 06/22/2018"  . Anemia, iron deficiency   . Angioedema    felt to likely be due to ace inhibitors but says she has had this even off of medicines, appears to be tolerating ARBs chronically  . Anxiety   . Arthritis    "hands, legs, arms; bad in my back" (06/22/2018)  . Asthmatic bronchitis with status asthmaticus   . Bradycardia   . CHF (congestive heart failure) ([place])   . Chronic lower back pain   . Chronic pain   . Chronic renal insufficiency   . CKD (chronic kidney disease), stage III ([place])   . Coronary artery disease    Mild nonobstructive (30% LAD, 30% RCA) by 09/01/16 cath at [facility]  . Degenerative joint disease   . Depression   . Diabetes mellitus, type 2 ([place])   . Diabetic peripheral neuropathy ([place])   . Dizziness   . Dyspnea on exertion   . Family history of adverse reaction to anesthesia    Mother has nausea  . GERD (gastroesophageal reflux disease)   . Gout   . Heart valve disorder   . History of blood transfusion 1981; ~ 2005; 12/2016   "childbirth; defibrillator OR; colostomy OR"  . History of mononucleosis 03/2014  . Hyperlipidemia   . Hypertension   . Hypothyroid   . Insomnia   . Intervertebral disc degeneration   . Ischemic cardiomyopathy   . LBBB (left bundle branch block)   . Myocardial infarction ([place]) dx'd ~ 2005  . Nonischemic  cardiomyopathy ([place])    s/p ICD in 2005. EF has since recovered  . Obesity   . On home oxygen therapy    "2L at night" (06/22/2018)  . OSA on CPAP   . Pneumonia    "couple times; last time was 12/2016" (06/22/2018)  . PONV (postoperative nausea and vomiting)   . Presence of permanent cardiac pacemaker    [employer]  . Swelling   . Syncope   . Systemic hypertension   . Vitamin D deficiency     Past Surgical History:  Procedure Laterality Date  . APPENDECTOMY  07/13/2017  . BLADDER SUSPENSION  1990   "w/hysterectomy"  . BOWEL RESECTION N/A 07/09/2018   Procedure: SMALL BOWEL RESECTION;  Surgeon: Judeth Horn, MD;  Location: [place];  Service: General;  Laterality: N/A;  . CARDIAC CATHETERIZATION  2005   no obstructive CAD per patient  . CARDIAC CATHETERIZATION  2018  . CARDIAC DEFIBRILLATOR PLACEMENT  2005   BiV ICD implanted,  LV lead is an epicardial lead  . CARPAL TUNNEL RELEASE Left 07/25/2014   Dr.Williamson   . CARPAL TUNNEL RELEASE Right 04/08/2019   Procedure: RIGHT CARPAL TUNNEL RELEASE ENDOSCOPIC;  Surgeon: Milly Jakob, MD;  Location: [place];  Service: Orthopedics;  Laterality: Right;  . CATARACT EXTRACTION W/ INTRAOCULAR LENS  IMPLANT, BILATERAL Bilateral   . COLONIC STENT PLACEMENT N/A 08/31/2017   Procedure: COLONIC STENT PLACEMENT;  Surgeon: Carol Ada, MD;  Location: [place];  Service: Endoscopy;  Laterality: N/A;  . COLONOSCOPY     2010-2011 Dr.Kipreos   . COLOSTOMY  12/2016   Archie Endo 01/26/2017  . COLOSTOMY REVERSAL N/A 07/13/2017   Procedure: COLOSTOMY REVERSAL;  Surgeon: Judeth Horn, MD;  Location: [place];  Service: General;  Laterality: N/A;  . CYST REMOVAL HAND Right 05/2013   thumb  . DILATION AND CURETTAGE OF UTERUS  X 5-6  . ESOPHAGOGASTRODUODENOSCOPY (EGD) WITH PROPOFOL N/A 03/13/2020   Procedure: ESOPHAGOGASTRODUODENOSCOPY (EGD) WITH PROPOFOL;  Surgeon: Carol Ada, MD;  [facility]: [facility];  Service: Endoscopy;  Laterality: N/A;  .  EXCISION MASS ABDOMINAL N/A 07/09/2018   Procedure: EXPLORATION OF  ABDOMINAL WOUND ERAS PATHWAY;  Surgeon: Judeth Horn, MD;  Location: [place] [place];  Service: General;  Laterality: N/A;  . FLEXIBLE SIGMOIDOSCOPY N/A 08/24/2017   Procedure: Beryle Quant;  Surgeon: Carol Ada, MD;  Location: [place];  Service: Endoscopy;  Laterality: N/A;  . FLEXIBLE SIGMOIDOSCOPY N/A 08/31/2017   Procedure: FLEXIBLE SIGMOIDOSCOPY;  Surgeon: Carol Ada, MD;  Location: [place];  Service: Endoscopy;  Laterality: N/A;  stent placement  . FLEXIBLE SIGMOIDOSCOPY N/A 03/13/2020   Procedure: FLEXIBLE SIGMOIDOSCOPY;  Surgeon: Carol Ada, MD;  Location: [facility];  Service: Endoscopy;  Laterality: N/A;  . HEMOSTASIS CLIP PLACEMENT  03/13/2020   Procedure: HEMOSTASIS CLIP PLACEMENT;  Surgeon: Carol Ada, MD;  Location: [facility];  Service: Endoscopy;;  . HOT HEMOSTASIS N/A 03/13/2020   Procedure: HOT HEMOSTASIS (ARGON PLASMA COAGULATION/BICAP);  Surgeon: Carol Ada, MD;  [facility];  Service: Endoscopy;  Laterality: N/A;  . IMPLANTABLE CARDIOVERTER DEFIBRILLATOR GENERATOR CHANGE  2008  . INSERTION OF MESH N/A 07/09/2018   Procedure: INSERTION OF VICRYL MESH;  Surgeon: Judeth Horn, MD;  Location: [place];  Service: General;  Laterality: N/A;  . LAPAROTOMY N/A 09/18/2017   Procedure: EXPLORATORY LAPAROTOMY;  Surgeon: Judeth Horn, MD;  Location: [facility];  Service: General;  Laterality: N/A;  . LEAD REVISION  06/22/2018  . LEAD REVISION/REPAIR N/A 06/22/2018   Procedure: LEAD REVISION/REPAIR;  Surgeon: Deboraha Sprang, MD;  Location: [place] [facility];  Service: Cardiovascular;  Laterality: N/A;  . LYSIS OF ADHESION N/A 09/18/2017   Procedure: LYSIS OF ADHESION;  Surgeon: Judeth Horn, MD;  Location: [place];  Service: General;  Laterality: N/A;  . LYSIS OF ADHESION N/A 07/09/2018   Procedure: LYSIS OF ADHESION;  Surgeon: Judeth Horn, MD;  Location: [place];  Service: General;  Laterality:  N/A;  . PARTIAL COLECTOMY N/A 09/18/2017   Procedure: ILEOCOLECTOMY;  Surgeon: Judeth Horn, MD;  Location: [place];  Service: General;  Laterality: N/A;  . PARTIAL COLECTOMY  09/25/2017  . POLYPECTOMY  03/13/2020   Procedure: POLYPECTOMY;  Surgeon: Carol Ada, MD;  Location[facility];  Service: Endoscopy;;  . RIGHT/LEFT HEART CATH AND CORONARY ANGIOGRAPHY N/A 06/01/2018   Procedure: RIGHT/LEFT HEART CATH AND CORONARY ANGIOGRAPHY;  Surgeon: Jettie Booze, MD;  Location: [place] [facility];  Service: Cardiovascular;  Laterality: N/A;  . SIGMOIDOSCOPY N/A 09/18/2017   Procedure: SIGMOIDOSCOPY;  Surgeon: Judeth Horn, MD;  Location: [place];  Service: General;  Laterality: N/A;  . SMALL INTESTINE SURGERY  07/09/2018   EXPLORATION OF ABDOMINAL WOUND ERAS PATHWAYN/; MESH; LYSIS OF ADHESIONS  . TOTAL ABDOMINAL HYSTERECTOMY  1990  . TRANSFORAMINAL LUMBAR INTERBODY FUSION (TLIF) WITH PEDICLE SCREW FIXATION  2 LEVEL N/A 01/07/2020   Procedure: Lumbar Four-Five and Lumbar Five-Sacral One open lumbar decompression and transforaminal lumbar interbody fusion;  Surgeon: Judith Part, MD;  Location: [place];  Service: Neurosurgery;  Laterality: N/A;  Lumbar Four-Five and Lumbar Five-Sacral One open lumbar decompression and transforaminal lumbar interbody fusion  . TRIGGER FINGER RELEASE Right 05/213  . TRIGGER FINGER RELEASE Right 04/08/2019   Procedure: RIGHT INDEX RELEASE TRIGGER FINGER/A-1 PULLEY;  Surgeon: Milly Jakob, MD;  Location: [place];  Service: Orthopedics;  Laterality: Right;  . TUBAL LIGATION  1980s  . VESICOVAGINAL FISTULA CLOSURE W/ TAH      Family History  Problem Relation Age of Onset  . Hypertension Father   . Heart disease Father   . Cancer Father        prostate  . Heart disease Other        (Maternal side) Ischemic heart disease  . Diabetes Mellitus II Other   . Arrhythmia Mother   . Diabetes Mellitus II Mother        Borderline DM  . Hypertension Mother   .  Asthma Mother   . Cancer Brother   . Dementia Brother   . Hypertension Brother   . Hypertension Daughter   . Hypertension Daughter   . Diabetes Daughter   . Hypertension Daughter   . Atrial fibrillation Daughter   . GER disease Daughter   . Hypertension Son   . Anxiety disorder Son   . Hypothyroidism Son   . Breast cancer Maternal Grandmother        in her 41's    Social History:  reports that she has never smoked. She has never used smokeless tobacco. She reports previous alcohol use. She reports that she does not use drugs.  Allergies:  Allergies  Allergen Reactions  . Ace Inhibitors Swelling and Other (See Comments)    Angioedema Other reaction(s): swelling  . Glucophage [Metformin Hcl] Other (See Comments)    Renal failure  . Telmisartan Other (See Comments)    AVOID ARB/ ACEi in this patient due to recurrent AKI  . Advicor [Niacin-Lovastatin Er] Other (See Comments)    Muscle aches  . Bystolic [Nebivolol Hcl] Swelling    Whole body swelled  . Erythromycin Diarrhea, Nausea And Vomiting and Other (See Comments)    *DERIVATIVES* Other reaction(s): severe vomiting  . Lipitor [Atorvastatin] Other (See Comments)    Muscle aches  . Lopid [Gemfibrozil] Other (See Comments)    Muscle aches  . Statins Other (See Comments)    Muscle aches tolerates crestor  . Dilaudid [Hydromorphone] Other (See Comments)    Confusion  . Losartan     Other reaction(s): stopped 12/2018. ? due to worse gfr  . Nebivolol Hcl     Other reaction(s): swelling  . Other     Other reaction(s): muscle aches  . Sacubitril-Valsartan     Other reaction(s): itching  . Spironolactone     Other reaction(s): arf    Medications:  Scheduled:  Continuous: . sodium chloride      No results found for this or any previous visit (from the past 24 hour(s)).   No results found.  ROS:  As stated above in the HPI otherwise negative.  Blood pressure (!) 137/50, pulse 80, temperature 98.3 F (36.8 C),  temperature source Oral, resp. rate 15, height 5' 4.5" (1.638 m), weight 108.9 kg, SpO2 97 %.    PE: Gen: NAD, Alert and Oriented HEENT:  [place]/AT, EOMI Neck: Supple,  no LAD Lungs: CTA Bilaterally CV: RRR without M/G/R ABD: Soft, NTND, +BS Ext: No C/C/E  Assessment/Plan: 1) Rectal bleeding - FFS  Crystal [name] D 12/18/2020, 8:42 AM

## 2020-12-18 NOTE — Anesthesia Postprocedure Evaluation (Signed)
Anesthesia Post Note  Patient: Crystal [name]  Procedure(s) Performed: FLEXIBLE SIGMOIDOSCOPY (N/A ) HOT HEMOSTASIS (ARGON PLASMA COAGULATION/BICAP) (N/A ) HEMOSTASIS CLIP PLACEMENT     Patient location during evaluation: PACU Anesthesia Type: MAC Level of consciousness: awake and alert and oriented Pain management: pain level controlled Vital Signs Assessment: post-procedure vital signs reviewed and stable Respiratory status: spontaneous breathing, nonlabored ventilation and respiratory function stable Cardiovascular status: blood pressure returned to baseline Postop Assessment: no apparent nausea or vomiting Anesthetic complications: no   No complications documented.  Last Vitals:  Vitals:   12/18/20 1010 12/18/20 1020  BP: (!) 120/50 (!) 145/59  Pulse:  74  Resp: 14 14  Temp:    SpO2: 93% 96%    Last Pain:  Vitals:   12/18/20 1020  TempSrc:   PainSc: 0-No pain                 Brennan Bailey

## 2020-12-18 NOTE — Anesthesia Procedure Notes (Signed)
Date/Time: 12/18/2020 9:31 AM Performed by: Sharlette Dense, CRNA Oxygen Delivery Method: Simple face mask

## 2020-12-18 NOTE — Op Note (Signed)
[facility] Patient Name: Crystal [name] Procedure Date: 12/18/2020 MRN: [id number] Attending MD: Carol Ada , MD Date of Birth: 1951-05-16 CSN: [id number] Age: 70 Admit Type: Outpatient Procedure:                Flexible Sigmoidoscopy Indications:              Hematochezia Providers:                Carol Ada, MD, Clyde Lundborg, RN, Elspeth Cho                            Tech., Technician, Danley Danker, CRNA Referring MD:              Medicines:                Propofol per Anesthesia Complications:            No immediate complications. Estimated Blood Loss:     Estimated blood loss: none. Procedure:                Pre-Anesthesia Assessment:                           - Prior to the procedure, a History and Physical                            was performed, and patient medications and                            allergies were reviewed. The patient's tolerance of                            previous anesthesia was also reviewed. The risks                            and benefits of the procedure and the sedation                            options and risks were discussed with the patient.                            All questions were answered, and informed consent                            was obtained. Prior Anticoagulants: The patient has                            taken no previous anticoagulant or antiplatelet                            agents. ASA Grade Assessment: III - A patient with                            severe systemic disease. After reviewing the risks  and benefits, the patient was deemed in                            satisfactory condition to undergo the procedure.                           - Sedation was administered by an anesthesia                            professional. Deep sedation was attained.                           After obtaining informed consent, the scope was                            passed under direct  vision. The [id number]-[id number] ([id number])                            Olympus gastroscope was introduced through the anus                            and advanced to the the ileo-rectal anastomosis.                            The flexible sigmoidoscopy was accomplished without                            difficulty. The patient tolerated the procedure                            well. The quality of the bowel preparation was good. Scope In: Scope Out: Findings:      Multiple medium-sized diffuse angiodysplastic lesions with stigmata of       recent bleeding were found in the recto-sigmoid colon. Coagulation for       hemostasis using monopolar probe was successful. To prevent bleeding       post-intervention, one hemostatic clip was successfully placed (MR       conditional). There was no bleeding at the end of the procedure.      There was evidence of a prior functional end-to-end ileo-rectal       anastomosis in the recto-sigmoid colon. This was patent and was       characterized by healthy appearing mucosa. The anastomosis was traversed.      The anastomosis was found at 20 cm. In this area there was evidence of       angiodysplasia and there was evidence from one area. Stool overlying       that area was mixed with blood. APC was applied to this area and       bleeding was induced. This was arrested with further applicationsof the       APC and a hemoclips was deployed. The AVMs were circumfirential around       the anastomosis. All visible abnormalities were treated with APC. Some       of these vessels were noted to be friable. In the rectum an AVM was       noted with a  central scar. This was the prior area of treatment and no       bleeding was observed from this site. Impression:               - Multiple recently bleeding colonic                            angiodysplastic lesions. Treated with a monopolar                            probe. Clip (MR conditional) was placed.                            - Patent functional end-to-end ileo-rectal                            anastomosis, characterized by healthy appearing                            mucosa.                           - No specimens collected. Moderate Sedation:      Not Applicable - Patient had care per Anesthesia. Recommendation:           - Patient has a contact number available for                            emergencies. The signs and symptoms of potential                            delayed complications were discussed with the                            patient. Return to normal activities tomorrow.                            Written discharge instructions were provided to the                            patient.                           - Resume regular diet.                           - Repeat flexible sigmoidoscopy PRN for retreatment.                           - Return to GI clinic in 4 weeks. Procedure Code(s):        --- Professional ---                           [phone], Sigmoidoscopy, flexible; with control of                            bleeding, any method Diagnosis Code(s):        ---  Professional ---                           K55.21, Angiodysplasia of colon with hemorrhage                           Z98.0, Intestinal bypass and anastomosis status                           K92.1, Melena (includes Hematochezia) CPT copyright 2019 [employer]. All rights reserved. The codes documented in this report are preliminary and upon coder review may  be revised to meet current compliance requirements. Carol Ada, MD Carol Ada, MD 12/18/2020 10:13:02 AM This report has been signed electronically. Number of Addenda: 0

## 2020-12-18 NOTE — Discharge Instructions (Signed)

## 2020-12-20 ENCOUNTER — Encounter (HOSPITAL_COMMUNITY): Payer: Self-pay | Admitting: Gastroenterology

## 2020-12-25 ENCOUNTER — Ambulatory Visit (INDEPENDENT_AMBULATORY_CARE_PROVIDER_SITE_OTHER): Payer: Medicare Other

## 2020-12-25 DIAGNOSIS — I428 Other cardiomyopathies: Secondary | ICD-10-CM | POA: Diagnosis not present

## 2020-12-25 LAB — CUP PACEART REMOTE DEVICE CHECK
Battery Remaining Longevity: 78 mo
Battery Remaining Percentage: 91 %
Brady Statistic RA Percent Paced: 0 %
Brady Statistic RV Percent Paced: 98 %
Date Time Interrogation Session: 20220603020100
HighPow Impedance: 78 Ohm
Implantable Lead Implant Date: 20191129
Implantable Lead Implant Date: 20191129
Implantable Lead Implant Date: 20191129
Implantable Lead Location: 753858
Implantable Lead Location: 753859
Implantable Lead Location: 753860
Implantable Lead Model: 293
Implantable Lead Model: 4086
Implantable Lead Serial Number: 226691
Implantable Lead Serial Number: 440868
Implantable Pulse Generator Implant Date: 20191129
Lead Channel Impedance Value: 491 Ohm
Lead Channel Impedance Value: 610 Ohm
Lead Channel Impedance Value: 866 Ohm
Lead Channel Setting Pacing Amplitude: 2 V
Lead Channel Setting Pacing Amplitude: 2.5 V
Lead Channel Setting Pacing Amplitude: 3 V
Lead Channel Setting Pacing Pulse Width: 0.4 ms
Lead Channel Setting Pacing Pulse Width: 1 ms
Lead Channel Setting Sensing Sensitivity: 0.5 mV
Lead Channel Setting Sensing Sensitivity: 1 mV
Pulse Gen Serial Number: 227627

## 2020-12-30 NOTE — Progress Notes (Signed)
Cardiology Office Note   Date:  01/01/2021   ID:  Crystal [name], DOB Mar 28, 1951, MRN [id number]  PCP:  Crist Infante, MD    No chief complaint on file.  Chronic combined heart failure  Wt Readings from Last 3 Encounters:  01/01/21 237 lb 12.8 oz (107.9 kg)  12/18/20 240 lb (108.9 kg)  10/22/20 245 lb 3.2 oz (111.2 kg)       History of Present Illness: Crystal [name] is a 70 y.o. female   With a h/o, per the old chart,: "Chronic systolic heart failure   - long hx of NICM dating back to 2005, with reported LVEF at that time 24%  - cath at that time showed no significant CAD.   - She has a chronic LBBB. Had a BiV AICD placed previously with epicardial LV lead with subsequent normalization of LVEF  - LVEF improved with BiV pacing to >55%.   - from notes device reached ERI, her RV lead has increasing thresholds around that time. It was decided not to replace the device since normal LVEF and follow her clinically.     - repeat echo 04/2014 LVEF 50-55% - questionable hx of angioedema on ACE-Is, she states has been on ARBs and has tolerated. "   She has CKD as well.    She had echo, cath in 2018 in [place], [place].  No CAD and EF 50% per her report.     SHe has had other medical problems.  She had a colostomy for colitis.  It has been reversed.     She has been gaining weight since that time.  She has gained 30 lbs.   SHe had carpal tunnel surgery.   SHe has been followed by Dr. Caryl Comes as well. 11/19 cath showed: Prox LAD lesion is 25% stenosed. Ost 1st Diag lesion is 25% stenosed. Mid LM lesion is 10% stenosed. LV end diastolic pressure is mildly elevated. LVEDP 17 mm Hg. There is no aortic valve stenosis. Hemodynamic findings consistent with mild pulmonary hypertension. Ao sat 98%, PA sat 66%, CO 4.79 L/min; CI 2.2, mean PA 30 mm Hg; mean PCWP 22 mm Hg   No significant CAD.    Medical therapy for LV dysfunction.    Echo in 09/2018 showed:  The left ventricle has  severely reduced systolic function, with an ejection fraction of 25-30%. The cavity size was moderately dilated. There is mildly increased left ventricular wall thickness. Left ventricular diastolic Doppler parameters are  consistent with impaired relaxation. Left ventricular diffuse hypokinesis.  2. The right ventricle has normal systolic function. The cavity was normal. There is no increase in right ventricular wall thickness.  3. Mild thickening of the mitral valve leaflet. There is mild mitral annular calcification present.  4. The tricuspid valve is grossly normal.  5. The aortic valve is tricuspid Mild thickening of the aortic valve.  6. There is mild dilatation of the aortic root and of the ascending aorta measuring 39 mm.  7. Severe global reduction in LV systolic function; mild diastolic dysfunction; moderate LVE; mild LVH; mild MR.   Had AKI with increased diuretic.    She was hospitalized with COVID in Jan 2021. Treated with Decadron and remdesivir.    She had back pain and had surgery in 5/21 for sciatica.  That went well.  EF had normalized at that time and she had no cardiac issues with surgery.    She got her COVID vaccines.    Did  have some anemia in 2021, received 2 transfusions, but improved with IV iron.    Knee pain limits walking.  Trying to avoid TKR.      Past Medical History:  Diagnosis Date   AICD (automatic cardioverter/defibrillator) present    high RV threshold chronically, device was turned off in 2014; Device battery has been dead x 7 years; "turned it back on 06/22/2018"   Anemia, iron deficiency    Angioedema    felt to likely be due to ace inhibitors but says she has had this even off of medicines, appears to be tolerating ARBs chronically   Anxiety    Arthritis    "hands, legs, arms; bad in my back" (06/22/2018)   Asthmatic bronchitis with status asthmaticus    Bradycardia    CHF (congestive heart failure) (HCC)    Chronic lower back pain     Chronic pain    Chronic renal insufficiency    CKD (chronic kidney disease), stage III (HCC)    Coronary artery disease    Mild nonobstructive (30% LAD, 30% RCA) by 09/01/16 cath at [facility]   Degenerative joint disease    Depression    Diabetes mellitus, type 2 ([place])    Diabetic peripheral neuropathy ([place])    Dizziness    Dyspnea on exertion    Family history of adverse reaction to anesthesia    Mother has nausea   GERD (gastroesophageal reflux disease)    Gout    Heart valve disorder    History of blood transfusion 1981; ~ 2005; 12/2016   "childbirth; defibrillator OR; colostomy OR"   History of mononucleosis 03/2014   Hyperlipidemia    Hypertension    Hypothyroid    Insomnia    Intervertebral disc degeneration    Ischemic cardiomyopathy    LBBB (left bundle branch block)    Myocardial infarction ([place]) dx'd ~ 2005   Nonischemic cardiomyopathy ([place])    s/p ICD in 2005. EF has since recovered   Obesity    On home oxygen therapy    "2L at night" (06/22/2018)   OSA on CPAP    Pneumonia    "couple times; last time was 12/2016" (06/22/2018)   PONV (postoperative nausea and vomiting)    Presence of permanent cardiac pacemaker    [employer]   Swelling    Syncope    Systemic hypertension    Vitamin D deficiency     Past Surgical History:  Procedure Laterality Date   APPENDECTOMY  07/13/2017   BLADDER SUSPENSION  1990   "w/hysterectomy"   BOWEL RESECTION N/A 07/09/2018   Procedure: SMALL BOWEL RESECTION;  Surgeon: Judeth Horn, MD;  Location: [facility];  Service: General;  Laterality: N/A;   CARDIAC CATHETERIZATION  2005   no obstructive CAD per patient   CARDIAC CATHETERIZATION  2018   CARDIAC DEFIBRILLATOR PLACEMENT  2005   BiV ICD implanted,  LV lead is an epicardial lead   CARPAL TUNNEL RELEASE Left 07/25/2014   Dr.Williamson    CARPAL TUNNEL RELEASE Right 04/08/2019   Procedure: RIGHT CARPAL TUNNEL RELEASE ENDOSCOPIC;  Surgeon: Milly Jakob, MD;   Location: [facility];  Service: Orthopedics;  Laterality: Right;   CATARACT EXTRACTION W/ INTRAOCULAR LENS  IMPLANT, BILATERAL Bilateral    COLONIC STENT PLACEMENT N/A 08/31/2017   Procedure: COLONIC STENT PLACEMENT;  Surgeon: Carol Ada, MD;  Location: [facility];  Service: Endoscopy;  Laterality: N/A;   COLONOSCOPY     2010-2011 Dr.Kipreos    COLOSTOMY  12/2016   Archie Endo 01/26/2017   COLOSTOMY REVERSAL N/A 07/13/2017   Procedure: COLOSTOMY REVERSAL;  Surgeon: Judeth Horn, MD;  Location: [facility];  Service: General;  Laterality: N/A;   CYST REMOVAL HAND Right 05/2013   thumb   DILATION AND CURETTAGE OF UTERUS  X 5-6   ESOPHAGOGASTRODUODENOSCOPY (EGD) WITH PROPOFOL N/A 03/13/2020   Procedure: ESOPHAGOGASTRODUODENOSCOPY (EGD) WITH PROPOFOL;  Surgeon: Carol Ada, MD;  [facility];  Service: Endoscopy;  Laterality: N/A;   EXCISION MASS ABDOMINAL N/A 07/09/2018   Procedure: EXPLORATION OF  ABDOMINAL WOUND ERAS PATHWAY;  Surgeon: Judeth Horn, MD;  Location: [facility];  Service: General;  Laterality: N/A;   FLEXIBLE SIGMOIDOSCOPY N/A 08/24/2017   Procedure: Beryle Quant;  Surgeon: Carol Ada, MD;  Location: [facility];  Service: Endoscopy;  Laterality: N/A;   FLEXIBLE SIGMOIDOSCOPY N/A 08/31/2017   Procedure: FLEXIBLE SIGMOIDOSCOPY;  Surgeon: Carol Ada, MD;  Location: [facility];  Service: Endoscopy;  Laterality: N/A;  stent placement   FLEXIBLE SIGMOIDOSCOPY N/A 03/13/2020   Procedure: FLEXIBLE SIGMOIDOSCOPY;  Surgeon: Carol Ada, MD;  [facility];  Service: Endoscopy;  Laterality: N/A;   FLEXIBLE SIGMOIDOSCOPY N/A 12/18/2020   Procedure: FLEXIBLE SIGMOIDOSCOPY;  Surgeon: Carol Ada, MD;  [facility];  Service: Endoscopy;  Laterality: N/A;   HEMOSTASIS CLIP PLACEMENT  03/13/2020   Procedure: HEMOSTASIS CLIP PLACEMENT;  Surgeon: Carol Ada, MD;  [facility];  Service: Endoscopy;;   HEMOSTASIS CLIP PLACEMENT  12/18/2020   Procedure:  HEMOSTASIS CLIP PLACEMENT;  Surgeon: Carol Ada, MD;  [facility];  Service: Endoscopy;;   HOT HEMOSTASIS N/A 03/13/2020   Procedure: HOT HEMOSTASIS (ARGON PLASMA COAGULATION/BICAP);  Surgeon: Carol Ada, MD;  Location[facility];  Service: Endoscopy;  Laterality: N/A;   HOT HEMOSTASIS N/A 12/18/2020   Procedure: HOT HEMOSTASIS (ARGON PLASMA COAGULATION/BICAP);  Surgeon: Carol Ada, MD;  Location[facility];  Service: Endoscopy;  Laterality: N/A;   IMPLANTABLE CARDIOVERTER DEFIBRILLATOR GENERATOR CHANGE  2008   INSERTION OF MESH N/A 07/09/2018   Procedure: INSERTION OF VICRYL MESH;  Surgeon: Judeth Horn, MD;  Location: [facility];  Service: General;  Laterality: N/A;   LAPAROTOMY N/A 09/18/2017   Procedure: EXPLORATORY LAPAROTOMY;  Surgeon: Judeth Horn, MD;  Location: [facility];  Service: General;  Laterality: N/A;   LEAD REVISION  06/22/2018   LEAD REVISION/REPAIR N/A 06/22/2018   Procedure: LEAD REVISION/REPAIR;  Surgeon: Deboraha Sprang, MD;  Location: [facility];  Service: Cardiovascular;  Laterality: N/A;   LYSIS OF ADHESION N/A 09/18/2017   Procedure: LYSIS OF ADHESION;  Surgeon: Judeth Horn, MD;  Location: [facility];  Service: General;  Laterality: N/A;   LYSIS OF ADHESION N/A 07/09/2018   Procedure: LYSIS OF ADHESION;  Surgeon: Judeth Horn, MD;  Location: [facility];  Service: General;  Laterality: N/A;   PARTIAL COLECTOMY N/A 09/18/2017   Procedure: ILEOCOLECTOMY;  Surgeon: Judeth Horn, MD;  Location: [facility];  Service: General;  Laterality: N/A;   PARTIAL COLECTOMY  09/25/2017   POLYPECTOMY  03/13/2020   Procedure: POLYPECTOMY;  Surgeon: Carol Ada, MD;  [facility];  Service: Endoscopy;;   RIGHT/LEFT HEART CATH AND CORONARY ANGIOGRAPHY N/A 06/01/2018   Procedure: RIGHT/LEFT HEART CATH AND CORONARY ANGIOGRAPHY;  Surgeon: Jettie Booze, MD;  Location: [facility];  Service: Cardiovascular;  Laterality: N/A;   SIGMOIDOSCOPY N/A 09/18/2017    Procedure: SIGMOIDOSCOPY;  Surgeon: Judeth Horn, MD;  Location: [facility];  Service: General;  Laterality: N/A;   SMALL  INTESTINE SURGERY  07/09/2018   EXPLORATION OF  ABDOMINAL WOUND ERAS PATHWAYN/; MESH; LYSIS OF ADHESIONS   TOTAL ABDOMINAL HYSTERECTOMY  1990   TRANSFORAMINAL LUMBAR INTERBODY FUSION (TLIF) WITH PEDICLE SCREW FIXATION 2 LEVEL N/A 01/07/2020   Procedure: Lumbar Four-Five and Lumbar Five-Sacral One open lumbar decompression and transforaminal lumbar interbody fusion;  Surgeon: Judith Part, MD;  Location: [facility];  Service: Neurosurgery;  Laterality: N/A;  Lumbar Four-Five and Lumbar Five-Sacral One open lumbar decompression and transforaminal lumbar interbody fusion   TRIGGER FINGER RELEASE Right 05/213   TRIGGER FINGER RELEASE Right 04/08/2019   Procedure: RIGHT INDEX RELEASE TRIGGER FINGER/A-1 PULLEY;  Surgeon: Milly Jakob, MD;  Location: [facility];  Service: Orthopedics;  Laterality: Right;   TUBAL LIGATION  1980s   VESICOVAGINAL FISTULA CLOSURE W/ TAH       Current Outpatient Medications  Medication Sig Dispense Refill   albuterol (PROVENTIL HFA;VENTOLIN HFA) 108 (90 Base) MCG/ACT inhaler Inhale 2 puffs into the lungs every 6 (six) hours as needed for wheezing or shortness of breath.      allopurinol (ZYLOPRIM) 100 MG tablet Take 100 mg by mouth daily.     carvedilol (COREG) 25 MG tablet Take 25 mg by mouth 2 (two) times daily.      Cholecalciferol 25 MCG (1000 UT) capsule Take 1,000 Units by mouth daily.     CRESTOR 10 MG tablet Take 1 tablet (10 mg total) by mouth at bedtime. 30 tablet 0   DULoxetine (CYMBALTA) 60 MG capsule Take 60 mg by mouth daily.     febuxostat (ULORIC) 40 MG tablet Take 40 mg by mouth daily.     gabapentin (NEURONTIN) 300 MG capsule Take 300 mg by mouth at bedtime.     hydrALAZINE (APRESOLINE) 25 MG tablet Take 1 tablet (25 mg total) by mouth 3 (three) times daily. (Patient taking differently: Take 25 mg by mouth 2 (two) times daily.) 270 tablet  3   HYDROcodone-acetaminophen (NORCO) 10-325 MG tablet Take 1 tablet by mouth daily. Prescribed by Dr. Nelva Bush     insulin regular human CONCENTRATED (HUMULIN R U-500 KWIKPEN) 500 UNIT/ML kwikpen Inject 130 Units into the skin 3 (three) times daily with meals.     levothyroxine (SYNTHROID, LEVOTHROID) 88 MCG tablet Take 88 mcg by mouth daily before breakfast.      loratadine (CLARITIN) 10 MG tablet Take 10 mg by mouth daily.     montelukast (SINGULAIR) 10 MG tablet Take 10 mg by mouth at bedtime.     omega-3 acid ethyl esters (LOVAZA) 1 g capsule Take 2 g by mouth 2 (two) times daily.     pantoprazole (PROTONIX) 40 MG tablet Take 40 mg by mouth 2 (two) times daily.   2   PRESCRIPTION MEDICATION Inhale into the lungs at bedtime. CPAP with  2 liter oxygen     temazepam (RESTORIL) 15 MG capsule Take 1 capsule (15 mg total) by mouth at bedtime as needed for sleep. 30 capsule 0   torsemide (DEMADEX) 20 MG tablet Take 20 mg by mouth daily. Take one tablet by mouth Monday through Friday.     No current facility-administered medications for this visit.    Allergies:   Ace inhibitors, Glucophage [metformin hcl], Telmisartan, Advicor [niacin-lovastatin er], Bystolic [nebivolol hcl], Erythromycin, Lipitor [atorvastatin], Lopid [gemfibrozil], Statins, Dilaudid [hydromorphone], Losartan, Nebivolol hcl, Other, Sacubitril-valsartan, and Spironolactone    Social History:  The patient  reports that she has never smoked. She has never used smokeless tobacco. She reports  previous alcohol use. She reports that she does not use drugs.   Family History:  The patient's family history includes Anxiety disorder in her son; Arrhythmia in her mother; Asthma in her mother; Atrial fibrillation in her daughter; Breast cancer in her maternal grandmother; Cancer in her brother and father; Dementia in her brother; Diabetes in her daughter; Diabetes Mellitus II in her mother and another family member; GER disease in her daughter;  Heart disease in her father and another family member; Hypertension in her brother, daughter, daughter, daughter, father, mother, and son; Hypothyroidism in her son.    ROS:  Please see the history of present illness.   Otherwise, review of systems are positive for knee pain- has received injections.   All other systems are reviewed and negative.    PHYSICAL EXAM: VS:  BP 120/68   Pulse 76   Ht 5' 4.5" (1.638 m)   Wt 237 lb 12.8 oz (107.9 kg)   SpO2 93%   BMI 40.19 kg/m  , BMI Body mass index is 40.19 kg/m. GEN: Well nourished, well developed, in no acute distress HEENT: normal Neck: no JVD, carotid bruits, or masses Cardiac: RRR; no murmurs, rubs, or gallops,no edema  Respiratory:  clear to auscultation bilaterally, normal work of breathing GI: soft, nontender, nondistended, + BS MS: no deformity or atrophy Skin: warm and dry, no rash Neuro:  Strength and sensation are intact Psych: euthymic mood, full affect   EKG:   The ekg ordered today demonstrates A sensed and V paced   Recent Labs: 01/03/2020: Platelets 253 01/07/2020: Hemoglobin 8.5 01/08/2020: BUN 36; Creatinine, Ser 1.81; Potassium 4.5; Sodium 138   Lipid Panel    Component Value Date/Time   CHOL 186 11/22/2017 1507   TRIG 279 (H) 11/22/2017 1507   HDL 48 (L) 11/22/2017 1507   CHOLHDL 3.9 11/22/2017 1507   LDLCALC 98 11/22/2017 1507     Other studies Reviewed: Additional studies/ records that were reviewed today with results demonstrating: labs reviewed. LDL 72 in 2022.   ASSESSMENT AND PLAN:  Nonischemic cardiomyopathy: Resolved as normal ejection fraction noted in May 2021.  Tolerating decreased frequency of torsemide. Left bundle branch block: Status post pacemaker. CKD stage III: Avoid nephrotoxins.  Creatinine increased to 5 at one point, but now came down to 2.  Avoid ibuprofen and other NSAIDs.  Stay well-hydrated. Hyperlipidemia: LDL 72 in April 2022. Tolerating Crestor.  Diabetes: A1C 7.7 in  2022.  High fiber, whole food, plant-based diet.   She has had a low grade chronic GI bleed that requires iron infusion several times a year.  She had some small vessels cauterized.    Current medicines are reviewed at length with the patient today.  The patient concerns regarding her medicines were addressed.  The following changes have been made:  No change  Labs/ tests ordered today include:  No orders of the defined types were placed in this encounter.   Recommend 150 minutes/week of aerobic exercise Low fat, low carb, high fiber diet recommended  Disposition:   FU in 1 year   Signed, Larae Grooms, MD  01/01/2021 9:32 AM    [facility] [address], [place], [place]  [address] Phone: [phone]; Fax: [phone]

## 2021-01-01 ENCOUNTER — Ambulatory Visit (INDEPENDENT_AMBULATORY_CARE_PROVIDER_SITE_OTHER): Payer: Medicare Other | Admitting: Interventional Cardiology

## 2021-01-01 ENCOUNTER — Other Ambulatory Visit: Payer: Self-pay

## 2021-01-01 ENCOUNTER — Encounter: Payer: Self-pay | Admitting: Interventional Cardiology

## 2021-01-01 VITALS — BP 120/68 | HR 76 | Ht 64.5 in | Wt 237.8 lb

## 2021-01-01 DIAGNOSIS — E782 Mixed hyperlipidemia: Secondary | ICD-10-CM

## 2021-01-01 DIAGNOSIS — Z794 Long term (current) use of insulin: Secondary | ICD-10-CM

## 2021-01-01 DIAGNOSIS — Z9989 Dependence on other enabling machines and devices: Secondary | ICD-10-CM | POA: Diagnosis not present

## 2021-01-01 DIAGNOSIS — I5042 Chronic combined systolic (congestive) and diastolic (congestive) heart failure: Secondary | ICD-10-CM

## 2021-01-01 DIAGNOSIS — Z95810 Presence of automatic (implantable) cardiac defibrillator: Secondary | ICD-10-CM | POA: Diagnosis not present

## 2021-01-01 DIAGNOSIS — G4733 Obstructive sleep apnea (adult) (pediatric): Secondary | ICD-10-CM

## 2021-01-01 DIAGNOSIS — I428 Other cardiomyopathies: Secondary | ICD-10-CM

## 2021-01-01 DIAGNOSIS — E1165 Type 2 diabetes mellitus with hyperglycemia: Secondary | ICD-10-CM | POA: Diagnosis not present

## 2021-01-01 DIAGNOSIS — I447 Left bundle-branch block, unspecified: Secondary | ICD-10-CM

## 2021-01-01 NOTE — Patient Instructions (Signed)

## 2021-01-05 DIAGNOSIS — N1832 Chronic kidney disease, stage 3b: Secondary | ICD-10-CM | POA: Diagnosis not present

## 2021-01-12 NOTE — Progress Notes (Signed)
Remote ICD transmission.   

## 2021-01-20 DIAGNOSIS — K5521 Angiodysplasia of colon with hemorrhage: Secondary | ICD-10-CM | POA: Diagnosis not present

## 2021-01-20 DIAGNOSIS — K921 Melena: Secondary | ICD-10-CM | POA: Diagnosis not present

## 2021-01-20 DIAGNOSIS — Z980 Intestinal bypass and anastomosis status: Secondary | ICD-10-CM | POA: Diagnosis not present

## 2021-01-21 ENCOUNTER — Encounter: Payer: Medicare Other | Attending: Physical Medicine & Rehabilitation | Admitting: Physical Medicine & Rehabilitation

## 2021-01-21 ENCOUNTER — Encounter: Payer: Self-pay | Admitting: Physical Medicine & Rehabilitation

## 2021-01-21 ENCOUNTER — Other Ambulatory Visit: Payer: Self-pay

## 2021-01-21 VITALS — BP 117/66 | HR 73 | Temp 98.3°F | Ht 64.5 in | Wt 240.4 lb

## 2021-01-21 DIAGNOSIS — M170 Bilateral primary osteoarthritis of knee: Secondary | ICD-10-CM | POA: Insufficient documentation

## 2021-01-21 MED ORDER — DIAZEPAM 5 MG PO TABS: 5.0000 mg | ORAL_TABLET | Freq: Once | ORAL | 0 refills | Status: AC

## 2021-01-21 NOTE — Patient Instructions (Signed)
Will to RIght genicular ablation next visit

## 2021-01-21 NOTE — Progress Notes (Signed)
Subjective:    Patient ID: Crystal [name], female    DOB: 1950/09/07, 70 y.o.   MRN: [id number]  HPI Chief complaint right knee pain 70 year old female with history of bilateral knee osteoarthritis is here for follow-up after genicular nerve block performed 3 months ago on the right knee.  She went had 100% pain relief for several hours.  The patient had 100% pain relief after left knee genicular nerve block performed September 17, 2020 but has had a prolonged response.  We discussed that a prolonged response to this block is unusual but at least has been helpful in alleviating her pain. Using diclofenac gel qhs, discussed need to increase to QID Pain Inventory Average Pain 6 Pain Right Now 4 My pain is intermittent, sharp, stabbing, and aching  In the last 24 hours, has pain interfered with the following? General activity 5 Relation with others 5 Enjoyment of life 8 What TIME of day is your pain at its worst? evening Sleep (in general) Fair  Pain is worse with: walking, bending, standing, and some activites Pain improves with: rest, heat/ice, and medication Relief from Meds: 7  Family History  Problem Relation Age of Onset   Hypertension Father    Heart disease Father    Cancer Father        prostate   Heart disease Other        (Maternal side) Ischemic heart disease   Diabetes Mellitus II Other    Arrhythmia Mother    Diabetes Mellitus II Mother        Borderline DM   Hypertension Mother    Asthma Mother    Cancer Brother    Dementia Brother    Hypertension Brother    Hypertension Daughter    Hypertension Daughter    Diabetes Daughter    Hypertension Daughter    Atrial fibrillation Daughter    GER disease Daughter    Hypertension Son    Anxiety disorder Son    Hypothyroidism Son    Breast cancer Maternal Grandmother        in her 70's   Social History   Socioeconomic History   Marital status: Widowed    Spouse name: Not on file   Number of children: Not on  file   Years of education: Not on file   Highest education level: Not on file  Occupational History    Comment: retired LPN  Tobacco Use   Smoking status: Never   Smokeless tobacco: Never  Vaping Use   Vaping Use: Never used  Substance and Sexual Activity   Alcohol use: Not Currently   Drug use: Never   Sexual activity: Not Currently  Other Topics Concern   Not on file  Social History Narrative   Pt lives in [place] ([place] alone.  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      As of 03/15/17:   Diet: 1800 Calorie      Caffeine: Yes      Married, if yes what year: Widowed, married 1973      Do you live in a house, apartment, assisted living, [place], trailer, ect: House, 1 stories, and 1 person      Pets: No      Current/Past profession: LPN, retired       Exercise: Yes, walking          Living Will: Yes   DNR: No   POA/HPOA: Yes  Functional Status:   Do you have difficulty bathing or dressing yourself? No   Do you have difficulty preparing food or eating? No   Do you have difficulty managing your medications? No   Do you have difficulty managing your finances? No   Do you have difficulty affording your medications? Yes   Social Determinants of Health   Financial Resource Strain: Not on file  Food Insecurity: Not on file  Transportation Needs: Not on file  Physical Activity: Not on file  Stress: Not on file  Social Connections: Not on file   Past Surgical History:  Procedure Laterality Date   APPENDECTOMY  07/13/2017   [facility]   "w/hysterectomy"   BOWEL RESECTION N/A 07/09/2018   Procedure: SMALL BOWEL RESECTION;  Surgeon: Judeth Horn, MD;  Location: [place];  Service: General;  Laterality: N/A;   CARDIAC CATHETERIZATION  2005   no obstructive CAD per patient   CARDIAC CATHETERIZATION  2018   CARDIAC DEFIBRILLATOR PLACEMENT  2005   BiV ICD implanted,  LV lead is an epicardial lead   CARPAL  TUNNEL RELEASE Left 07/25/2014   Dr.Williamson    CARPAL TUNNEL RELEASE Right 04/08/2019   Procedure: RIGHT CARPAL TUNNEL RELEASE ENDOSCOPIC;  Surgeon: Milly Jakob, MD;  Location: [place];  Service: Orthopedics;  Laterality: Right;   CATARACT EXTRACTION W/ INTRAOCULAR LENS  IMPLANT, BILATERAL Bilateral    COLONIC STENT PLACEMENT N/A 08/31/2017   Procedure: COLONIC STENT PLACEMENT;  Surgeon: Carol Ada, MD;  Location: [place];  Service: Endoscopy;  Laterality: N/A;   COLONOSCOPY     2010-2011 Dr.Kipreos    COLOSTOMY  12/2016   Archie Endo 01/26/2017   COLOSTOMY REVERSAL N/A 07/13/2017   Procedure: COLOSTOMY REVERSAL;  Surgeon: Judeth Horn, MD;  Location: [facility];  Service: General;  Laterality: N/A;   CYST REMOVAL HAND Right 05/2013   thumb   DILATION AND CURETTAGE OF UTERUS  X 5-6   ESOPHAGOGASTRODUODENOSCOPY (EGD) WITH PROPOFOL N/A 03/13/2020   Procedure: ESOPHAGOGASTRODUODENOSCOPY (EGD) WITH PROPOFOL;  Surgeon: Carol Ada, MD;  [facility];  Service: Endoscopy;  Laterality: N/A;   EXCISION MASS ABDOMINAL N/A 07/09/2018   Procedure: EXPLORATION OF  ABDOMINAL WOUND ERAS PATHWAY;  Surgeon: Judeth Horn, MD;  Location: [place];  Service: General;  Laterality: N/A;   FLEXIBLE SIGMOIDOSCOPY N/A 08/24/2017   Procedure: Beryle Quant;  Surgeon: Carol Ada, MD;  Location: [facility];  Service: Endoscopy;  Laterality: N/A;   FLEXIBLE SIGMOIDOSCOPY N/A 08/31/2017   Procedure: FLEXIBLE SIGMOIDOSCOPY;  Surgeon: Carol Ada, MD;  Location: [place];  Service: Endoscopy;  Laterality: N/A;  stent placement   FLEXIBLE SIGMOIDOSCOPY N/A 03/13/2020   Procedure: FLEXIBLE SIGMOIDOSCOPY;  Surgeon: Carol Ada, MD;  [facility];  Service: Endoscopy;  Laterality: N/A;   FLEXIBLE SIGMOIDOSCOPY N/A 12/18/2020   Procedure: FLEXIBLE SIGMOIDOSCOPY;  Surgeon: Carol Ada, MD;  [facility];  Service: Endoscopy;  Laterality: N/A;   HEMOSTASIS CLIP PLACEMENT  03/13/2020    Procedure: HEMOSTASIS CLIP PLACEMENT;  Surgeon: Carol Ada, MD;  [facility];  Service: Endoscopy;;   HEMOSTASIS CLIP PLACEMENT  12/18/2020   Procedure: HEMOSTASIS CLIP PLACEMENT;  Surgeon: Carol Ada, MD;  [facility];  Service: Endoscopy;;   HOT HEMOSTASIS N/A 03/13/2020   Procedure: HOT HEMOSTASIS (ARGON PLASMA COAGULATION/BICAP);  Surgeon: Carol Ada, MD;  [facility];  Service: Endoscopy;  Laterality: N/A;   HOT HEMOSTASIS N/A 12/18/2020   Procedure: HOT HEMOSTASIS (ARGON PLASMA COAGULATION/BICAP);  Surgeon: Carol Ada, MD;  [facility];  Service: Endoscopy;  Laterality: N/A;   IMPLANTABLE CARDIOVERTER DEFIBRILLATOR GENERATOR CHANGE  2008   INSERTION OF MESH N/A 07/09/2018   Procedure: INSERTION OF VICRYL MESH;  Surgeon: Judeth Horn, MD;  Location: [place];  Service: General;  Laterality: N/A;   LAPAROTOMY N/A 09/18/2017   Procedure: EXPLORATORY LAPAROTOMY;  Surgeon: Judeth Horn, MD;  Location: [facility];  Service: General;  Laterality: N/A;   LEAD REVISION  06/22/2018   LEAD REVISION/REPAIR N/A 06/22/2018   Procedure: LEAD REVISION/REPAIR;  Surgeon: Deboraha Sprang, MD;  Location: [facility];  Service: Cardiovascular;  Laterality: N/A;   LYSIS OF ADHESION N/A 09/18/2017   Procedure: LYSIS OF ADHESION;  Surgeon: Judeth Horn, MD;  Location: [place];  Service: General;  Laterality: N/A;   LYSIS OF ADHESION N/A 07/09/2018   Procedure: LYSIS OF ADHESION;  Surgeon: Judeth Horn, MD;  Location: [place];  Service: General;  Laterality: N/A;   PARTIAL COLECTOMY N/A 09/18/2017   Procedure: ILEOCOLECTOMY;  Surgeon: Judeth Horn, MD;  Location: [place] [facility];  Service: General;  Laterality: N/A;   PARTIAL COLECTOMY  09/25/2017   POLYPECTOMY  03/13/2020   Procedure: POLYPECTOMY;  Surgeon: Carol Ada, MD;  [facility];  Service: Endoscopy;;   RIGHT/LEFT HEART CATH AND CORONARY ANGIOGRAPHY N/A 06/01/2018   Procedure: RIGHT/LEFT HEART CATH AND  CORONARY ANGIOGRAPHY;  Surgeon: Jettie Booze, MD;  Location: [facility];  Service: Cardiovascular;  Laterality: N/A;   SIGMOIDOSCOPY N/A 09/18/2017   Procedure: SIGMOIDOSCOPY;  Surgeon: Judeth Horn, MD;  Location: [place] [place];  Service: General;  Laterality: N/A;   SMALL INTESTINE SURGERY  07/09/2018   EXPLORATION OF  ABDOMINAL WOUND ERAS PATHWAYN/; MESH; LYSIS OF ADHESIONS   TOTAL ABDOMINAL HYSTERECTOMY  1990   TRANSFORAMINAL LUMBAR INTERBODY FUSION (TLIF) WITH PEDICLE SCREW FIXATION 2 LEVEL N/A 01/07/2020   Procedure: Lumbar Four-Five and Lumbar Five-Sacral One open lumbar decompression and transforaminal lumbar interbody fusion;  Surgeon: Judith Part, MD;  Location: [place];  Service: Neurosurgery;  Laterality: N/A;  Lumbar Four-Five and Lumbar Five-Sacral One open lumbar decompression and transforaminal lumbar interbody fusion   TRIGGER FINGER RELEASE Right 05/213   TRIGGER FINGER RELEASE Right 04/08/2019   Procedure: RIGHT INDEX RELEASE TRIGGER FINGER/A-1 PULLEY;  Surgeon: Milly Jakob, MD;  Location: [facility];  Service: Orthopedics;  Laterality: Right;   TUBAL LIGATION  1980s   VESICOVAGINAL FISTULA CLOSURE W/ TAH     Past Surgical History:  Procedure Laterality Date   APPENDECTOMY  07/13/2017   BLADDER SUSPENSION  1990   "w/hysterectomy"   BOWEL RESECTION N/A 07/09/2018   Procedure: SMALL BOWEL RESECTION;  Surgeon: Judeth Horn, MD;  Location: [facility];  Service: General;  Laterality: N/A;   CARDIAC CATHETERIZATION  2005   no obstructive CAD per patient   CARDIAC CATHETERIZATION  2018   CARDIAC DEFIBRILLATOR PLACEMENT  2005   BiV ICD implanted,  LV lead is an epicardial lead   CARPAL TUNNEL RELEASE Left 07/25/2014   Dr.Williamson    CARPAL TUNNEL RELEASE Right 04/08/2019   Procedure: RIGHT CARPAL TUNNEL RELEASE ENDOSCOPIC;  Surgeon: Milly Jakob, MD;  Location: [facility];  Service: Orthopedics;  Laterality: Right;   CATARACT EXTRACTION W/ INTRAOCULAR LENS  IMPLANT,  BILATERAL Bilateral    COLONIC STENT PLACEMENT N/A 08/31/2017   Procedure: COLONIC STENT PLACEMENT;  Surgeon: Carol Ada, MD;  Location: [place];  Service: Endoscopy;  Laterality: N/A;   COLONOSCOPY     2010-2011 Dr.Kipreos  COLOSTOMY  12/2016   Archie Endo 01/26/2017   COLOSTOMY REVERSAL N/A 07/13/2017   Procedure: COLOSTOMY REVERSAL;  Surgeon: Judeth Horn, MD;  Location: [facility];  Service: General;  Laterality: N/A;   CYST REMOVAL HAND Right 05/2013   thumb   DILATION AND CURETTAGE OF UTERUS  X 5-6   ESOPHAGOGASTRODUODENOSCOPY (EGD) WITH PROPOFOL N/A 03/13/2020   Procedure: ESOPHAGOGASTRODUODENOSCOPY (EGD) WITH PROPOFOL;  Surgeon: Carol Ada, MD;  [facility];  Service: Endoscopy;  Laterality: N/A;   EXCISION MASS ABDOMINAL N/A 07/09/2018   Procedure: EXPLORATION OF  ABDOMINAL WOUND ERAS PATHWAY;  Surgeon: Judeth Horn, MD;  Location: [facility];  Service: General;  Laterality: N/A;   FLEXIBLE SIGMOIDOSCOPY N/A 08/24/2017   Procedure: Beryle Quant;  Surgeon: Carol Ada, MD;  Location: [facility];  Service: Endoscopy;  Laterality: N/A;   FLEXIBLE SIGMOIDOSCOPY N/A 08/31/2017   Procedure: FLEXIBLE SIGMOIDOSCOPY;  Surgeon: Carol Ada, MD;  Location: [place];  Service: Endoscopy;  Laterality: N/A;  stent placement   FLEXIBLE SIGMOIDOSCOPY N/A 03/13/2020   Procedure: FLEXIBLE SIGMOIDOSCOPY;  Surgeon: Carol Ada, MD;  [facility];  Service: Endoscopy;  Laterality: N/A;   FLEXIBLE SIGMOIDOSCOPY N/A 12/18/2020   Procedure: FLEXIBLE SIGMOIDOSCOPY;  Surgeon: Carol Ada, MD;  [facility];  Service: Endoscopy;  Laterality: N/A;   HEMOSTASIS CLIP PLACEMENT  03/13/2020   Procedure: HEMOSTASIS CLIP PLACEMENT;  Surgeon: Carol Ada, MD;  [facility];  Service: Endoscopy;;   HEMOSTASIS CLIP PLACEMENT  12/18/2020   Procedure: HEMOSTASIS CLIP PLACEMENT;  Surgeon: Carol Ada, MD;  [facility];  Service: Endoscopy;;   HOT  HEMOSTASIS N/A 03/13/2020   Procedure: HOT HEMOSTASIS (ARGON PLASMA COAGULATION/BICAP);  Surgeon: Carol Ada, MD;  [facility];  Service: Endoscopy;  Laterality: N/A;   HOT HEMOSTASIS N/A 12/18/2020   Procedure: HOT HEMOSTASIS (ARGON PLASMA COAGULATION/BICAP);  Surgeon: Carol Ada, MD;  [facility];  Service: Endoscopy;  Laterality: N/A;   IMPLANTABLE CARDIOVERTER DEFIBRILLATOR GENERATOR CHANGE  2008   INSERTION OF MESH N/A 07/09/2018   Procedure: INSERTION OF VICRYL MESH;  Surgeon: Judeth Horn, MD;  Location: [place];  Service: General;  Laterality: N/A;   LAPAROTOMY N/A 09/18/2017   Procedure: EXPLORATORY LAPAROTOMY;  Surgeon: Judeth Horn, MD;  Location: [facility];  Service: General;  Laterality: N/A;   LEAD REVISION  06/22/2018   LEAD REVISION/REPAIR N/A 06/22/2018   Procedure: LEAD REVISION/REPAIR;  Surgeon: Deboraha Sprang, MD;  Location: [facility];  Service: Cardiovascular;  Laterality: N/A;   LYSIS OF ADHESION N/A 09/18/2017   Procedure: LYSIS OF ADHESION;  Surgeon: Judeth Horn, MD;  Location: [facility];  Service: General;  Laterality: N/A;   LYSIS OF ADHESION N/A 07/09/2018   Procedure: LYSIS OF ADHESION;  Surgeon: Judeth Horn, MD;  Location: [place];  Service: General;  Laterality: N/A;   PARTIAL COLECTOMY N/A 09/18/2017   Procedure: ILEOCOLECTOMY;  Surgeon: Judeth Horn, MD;  Location: [facility];  Service: General;  Laterality: N/A;   PARTIAL COLECTOMY  09/25/2017   POLYPECTOMY  03/13/2020   Procedure: POLYPECTOMY;  Surgeon: Carol Ada, MD;  [facility];  Service: Endoscopy;;   RIGHT/LEFT HEART CATH AND CORONARY ANGIOGRAPHY N/A 06/01/2018   Procedure: RIGHT/LEFT HEART CATH AND CORONARY ANGIOGRAPHY;  Surgeon: Jettie Booze, MD;  Location: [facility];  Service: Cardiovascular;  Laterality: N/A;   SIGMOIDOSCOPY N/A 09/18/2017   Procedure: SIGMOIDOSCOPY;  Surgeon: Judeth Horn, MD;  Location: [facility];  Service: General;  Laterality: N/A;  SMALL INTESTINE SURGERY  07/09/2018   EXPLORATION OF  ABDOMINAL WOUND ERAS PATHWAYN/; MESH; LYSIS OF ADHESIONS   TOTAL ABDOMINAL HYSTERECTOMY  1990   TRANSFORAMINAL LUMBAR INTERBODY FUSION (TLIF) WITH PEDICLE SCREW FIXATION 2 LEVEL N/A 01/07/2020   Procedure: Lumbar Four-Five and Lumbar Five-Sacral One open lumbar decompression and transforaminal lumbar interbody fusion;  Surgeon: Judith Part, MD;  Location: [facility];  Service: Neurosurgery;  Laterality: N/A;  Lumbar Four-Five and Lumbar Five-Sacral One open lumbar decompression and transforaminal lumbar interbody fusion   TRIGGER FINGER RELEASE Right 05/213   TRIGGER FINGER RELEASE Right 04/08/2019   Procedure: RIGHT INDEX RELEASE TRIGGER FINGER/A-1 PULLEY;  Surgeon: Milly Jakob, MD;  Location: [place];  Service: Orthopedics;  Laterality: Right;   TUBAL LIGATION  1980s   VESICOVAGINAL FISTULA CLOSURE W/ TAH     Past Medical History:  Diagnosis Date   AICD (automatic cardioverter/defibrillator) present    high RV threshold chronically, device was turned off in 2014; Device battery has been dead x 7 years; "turned it back on 06/22/2018"   Anemia, iron deficiency    Angioedema    felt to likely be due to ace inhibitors but says she has had this even off of medicines, appears to be tolerating ARBs chronically   Anxiety    Arthritis    "hands, legs, arms; bad in my back" (06/22/2018)   Asthmatic bronchitis with status asthmaticus    Bradycardia    CHF (congestive heart failure) (HCC)    Chronic lower back pain    Chronic pain    Chronic renal insufficiency    CKD (chronic kidney disease), stage III (HCC)    Coronary artery disease    Mild nonobstructive (30% LAD, 30% RCA) by 09/01/16 cath at [facility]   Degenerative joint disease    Depression    Diabetes mellitus, type 2 ([place])    Diabetic peripheral neuropathy ([place])    Dizziness    Dyspnea on exertion    Family history of adverse reaction to anesthesia    Mother has  nausea   GERD (gastroesophageal reflux disease)    Gout    Heart valve disorder    History of blood transfusion 1981; ~ 2005; 12/2016   "childbirth; defibrillator OR; colostomy OR"   History of mononucleosis 03/2014   Hyperlipidemia    Hypertension    Hypothyroid    Insomnia    Intervertebral disc degeneration    Ischemic cardiomyopathy    LBBB (left bundle branch block)    Myocardial infarction ([place]) dx'd ~ 2005   Nonischemic cardiomyopathy ([place])    s/p ICD in 2005. EF has since recovered   Obesity    On home oxygen therapy    "2L at night" (06/22/2018)   OSA on CPAP    Pneumonia    "couple times; last time was 12/2016" (06/22/2018)   PONV (postoperative nausea and vomiting)    Presence of permanent cardiac pacemaker    [employer]   Swelling    Syncope    Systemic hypertension    Vitamin D deficiency    BP 117/66 (BP Location: Right Arm, Patient Position: Sitting, Cuff Size: Large)   Pulse 73   Temp 98.3 F (36.8 C) (Oral)   Ht 5' 4.5" (1.638 m)   Wt 240 lb 6.4 oz (109 kg)   SpO2 95%   BMI 40.63 kg/m   Opioid Risk Score:   Fall Risk Score:  `1  Depression screen PHQ 2/9  Depression screen PHQ  2/9 09/17/2020 08/20/2020 05/04/2018 10/11/2017 03/15/2017 03/13/2017  Decreased Interest 0 0 0 0 0 0  Down, Depressed, Hopeless 0 0 0 1 0 0  PHQ - 2 Score 0 0 0 1 0 0  Altered sleeping - 3 - - - 0  Tired, decreased energy - 3 - - - 2  Change in appetite - - - - - 0  Feeling bad or failure about yourself  - 0 - - - 0  Trouble concentrating - 0 - - - 0  Moving slowly or fidgety/restless - 0 - - - 0  Suicidal thoughts - 0 - - - 0  PHQ-9 Score - 6 - - - 2      Review of Systems  Musculoskeletal:  Positive for back pain.       Right knee pain  All other systems reviewed and are negative.     Objective:   Physical Exam Constitutional:      Appearance: She is obese.  HENT:     Head: Normocephalic and atraumatic.  Eyes:     Extraocular Movements: Extraocular  movements intact.     Conjunctiva/sclera: Conjunctivae normal.     Pupils: Pupils are equal, round, and reactive to light.  Musculoskeletal:     Comments: There is minimal right knee effusion, full knee extension with flexion limited to 100 degrees.  There is no tenderness to palpation there is pain with standing, there is no evidence of erythema.  No evidence of knee instability.  Left knee has full range of motion no evidence of effusion no tender with palpation no evidence of erythema.  No evidence of knee instability  Skin:    General: Skin is warm and dry.  Neurological:     Mental Status: She is alert and oriented to person, place, and time.  Psychiatric:        Mood and Affect: Mood normal.        Behavior: Behavior normal.          Assessment & Plan:  1.  Bilateral knee osteoarthritis now symptomatic on the right side.  She had a prolonged response to left genicular nerve block and we discussed that this may wear off at any time although it is difficult to predict when. In regards to the right knee, the genicular nerve block produced 100% pain relief for duration of the lidocaine which is concordant with what we would expect. We will schedule for right genicular nerve RF.  Premedicate with Valium 5 mg, prescription printed for patient. Patient will need a driver. Recommend increasing Voltaren gel to 4 times daily.

## 2021-02-15 DIAGNOSIS — D509 Iron deficiency anemia, unspecified: Secondary | ICD-10-CM | POA: Diagnosis not present

## 2021-02-15 DIAGNOSIS — I5042 Chronic combined systolic (congestive) and diastolic (congestive) heart failure: Secondary | ICD-10-CM | POA: Diagnosis not present

## 2021-02-15 DIAGNOSIS — E559 Vitamin D deficiency, unspecified: Secondary | ICD-10-CM | POA: Diagnosis not present

## 2021-02-15 DIAGNOSIS — N184 Chronic kidney disease, stage 4 (severe): Secondary | ICD-10-CM | POA: Diagnosis not present

## 2021-02-15 DIAGNOSIS — I129 Hypertensive chronic kidney disease with stage 1 through stage 4 chronic kidney disease, or unspecified chronic kidney disease: Secondary | ICD-10-CM | POA: Diagnosis not present

## 2021-02-15 DIAGNOSIS — E1122 Type 2 diabetes mellitus with diabetic chronic kidney disease: Secondary | ICD-10-CM | POA: Diagnosis not present

## 2021-02-17 DIAGNOSIS — G894 Chronic pain syndrome: Secondary | ICD-10-CM | POA: Diagnosis not present

## 2021-02-26 ENCOUNTER — Encounter: Payer: Medicare Other | Attending: Physical Medicine & Rehabilitation | Admitting: Physical Medicine & Rehabilitation

## 2021-02-26 ENCOUNTER — Other Ambulatory Visit: Payer: Self-pay

## 2021-02-26 ENCOUNTER — Encounter: Payer: Self-pay | Admitting: Physical Medicine & Rehabilitation

## 2021-02-26 VITALS — BP 100/63 | HR 84 | Temp 97.9°F | Ht 64.5 in | Wt 234.6 lb

## 2021-02-26 DIAGNOSIS — M170 Bilateral primary osteoarthritis of knee: Secondary | ICD-10-CM

## 2021-02-26 NOTE — Patient Instructions (Signed)
You had a radio frequency procedure today This was done to alleviate joint pain in your lumbar area We injected lidocaine which is a local anesthetic.  You may experience soreness at the injection sites. You may also experienced some irritation of the nerves that were heated I'm recommending ice for 30 minutes every 2 hours as needed for the next 24-48 hours   

## 2021-02-26 NOTE — Progress Notes (Signed)
  [facility]   Name: Crystal [name] DOB:07-18-1951 MRN: [id number]  Date:02/26/2021  Physician: Alysia Penna, MD    Nurse/CMA: Geryl Rankins CMA  Allergies:  Allergies  Allergen Reactions   Ace Inhibitors Swelling and Other (See Comments)    Angioedema Other reaction(s): swelling   Glucophage [Metformin Hcl] Other (See Comments)    Renal failure   Telmisartan Other (See Comments)    AVOID ARB/ ACEi in this patient due to recurrent AKI   Advicor [Niacin-Lovastatin Er] Other (See Comments)    Muscle aches   Bystolic [Nebivolol Hcl] Swelling    Whole body swelled   Erythromycin Diarrhea, Nausea And Vomiting and Other (See Comments)    *DERIVATIVES* Other reaction(s): severe vomiting   Lipitor [Atorvastatin] Other (See Comments)    Muscle aches   Lopid [Gemfibrozil] Other (See Comments)    Muscle aches   Statins Other (See Comments)    Muscle aches tolerates crestor   Dilaudid [Hydromorphone] Other (See Comments)    Confusion   Losartan     Other reaction(s): stopped 12/2018. ? due to worse gfr   Nebivolol Hcl     Other reaction(s): swelling   Other     Other reaction(s): muscle aches   Sacubitril-Valsartan     Other reaction(s): itching   Spironolactone     Other reaction(s): arf    Consent Signed: Yes.    Is patient diabetic? No.  CBG today? 129  Pregnant: No. LMP: No LMP recorded. Patient has had a hysterectomy. (age 35-54)  Anticoagulants: no Anti-inflammatory: no Antibiotics: no  Procedure: Right Knee Genicular Nerve Block  Position: Supine Start Time:  12:20pm End Time: 12:55pm  Fluoro Time: 39s  RN/CMA Jayleon Mcfarlane MA [place], CMA    Time 12:21 pm 12:58pm    BP 100/63 104/66    Pulse 84 78    Respirations 16 16    O2 Sat 94 92    S/S 6 6    Pain Level 6/10 0/10     D/C home with Daughter, patient A & O X 3, D/C instructions reviewed, and sits independently.

## 2021-02-26 NOTE — Progress Notes (Signed)
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 Patient placed supine on the fluoroscopy table AP images of the knee joint were obtained. A 25-gauge 1.5 inch needle was used to anesthetize the skin and subcutaneous tissue with 1% lidocaine, 1 cc at each of 3 locations. Then a 18-gauge 100 cm radiofrequency needle with 1 cm curved tip was inserted targeting the junction of the medial flare of the tibia with the shaft of the tibia, bone contact made and confirmed with lateral imaging.  Electrical stimulation up to 2 mV without abnormal twitch followed by injection of 1.60ml .5% bupivacaine.  Radiofrequency lesioning at 80 C x 80 seconds was performed.  Then the junction of the medial epicondyles of the femur with the femoral shaft was targeted needle was advanced under fluoroscopic guidance until bone contact. Appropriate depth was obtained and confirmed with lateral images.  Motor stimulation up to 2 mV demonstrated no abnormal twitch followed by injection of of 1.99ml of .5% bupivacaine.  Radiofrequency lesioning 80 C x 80 seconds was performed.  Then the junction of the lateral femoral condyle with the femoral shaft was targeted. 22-gauge 3.5 inch needle was advanced under fluoroscopic guidance until bone contact. Appropriate depth was confirmed with lateral imaging. Motor stimulation up to 2 mV demonstrated no abnormal twitch followed by injection of of 1.41ml of .5% bupivacaine.  Radiofrequency lesioning at 80 C x 80 seconds was performed. Patient  tolerated procedure well. Post procedure instructions given

## 2021-03-26 ENCOUNTER — Ambulatory Visit (INDEPENDENT_AMBULATORY_CARE_PROVIDER_SITE_OTHER): Payer: Medicare Other

## 2021-03-26 DIAGNOSIS — I428 Other cardiomyopathies: Secondary | ICD-10-CM

## 2021-03-30 DIAGNOSIS — M109 Gout, unspecified: Secondary | ICD-10-CM | POA: Diagnosis not present

## 2021-03-30 DIAGNOSIS — G4733 Obstructive sleep apnea (adult) (pediatric): Secondary | ICD-10-CM | POA: Diagnosis not present

## 2021-03-30 DIAGNOSIS — I10 Essential (primary) hypertension: Secondary | ICD-10-CM | POA: Diagnosis not present

## 2021-03-30 DIAGNOSIS — G609 Hereditary and idiopathic neuropathy, unspecified: Secondary | ICD-10-CM | POA: Diagnosis not present

## 2021-03-30 DIAGNOSIS — N184 Chronic kidney disease, stage 4 (severe): Secondary | ICD-10-CM | POA: Diagnosis not present

## 2021-03-30 DIAGNOSIS — E1129 Type 2 diabetes mellitus with other diabetic kidney complication: Secondary | ICD-10-CM | POA: Diagnosis not present

## 2021-03-30 DIAGNOSIS — I509 Heart failure, unspecified: Secondary | ICD-10-CM | POA: Diagnosis not present

## 2021-03-30 DIAGNOSIS — E785 Hyperlipidemia, unspecified: Secondary | ICD-10-CM | POA: Diagnosis not present

## 2021-03-30 DIAGNOSIS — Z23 Encounter for immunization: Secondary | ICD-10-CM | POA: Diagnosis not present

## 2021-03-30 DIAGNOSIS — Z95810 Presence of automatic (implantable) cardiac defibrillator: Secondary | ICD-10-CM | POA: Diagnosis not present

## 2021-03-30 DIAGNOSIS — F329 Major depressive disorder, single episode, unspecified: Secondary | ICD-10-CM | POA: Diagnosis not present

## 2021-03-30 DIAGNOSIS — E1159 Type 2 diabetes mellitus with other circulatory complications: Secondary | ICD-10-CM | POA: Diagnosis not present

## 2021-03-30 LAB — CUP PACEART REMOTE DEVICE CHECK
Battery Remaining Longevity: 78 mo
Battery Remaining Percentage: 87 %
Brady Statistic RA Percent Paced: 0 %
Brady Statistic RV Percent Paced: 98 %
Date Time Interrogation Session: 20220902112400
HighPow Impedance: 72 Ohm
Implantable Lead Implant Date: 20191129
Implantable Lead Implant Date: 20191129
Implantable Lead Implant Date: 20191129
Implantable Lead Location: 753858
Implantable Lead Location: 753859
Implantable Lead Location: 753860
Implantable Lead Model: 293
Implantable Lead Model: 4086
Implantable Lead Serial Number: 226691
Implantable Lead Serial Number: 440868
Implantable Pulse Generator Implant Date: 20191129
Lead Channel Impedance Value: 493 Ohm
Lead Channel Impedance Value: 614 Ohm
Lead Channel Impedance Value: 863 Ohm
Lead Channel Setting Pacing Amplitude: 2 V
Lead Channel Setting Pacing Amplitude: 2.5 V
Lead Channel Setting Pacing Amplitude: 3 V
Lead Channel Setting Pacing Pulse Width: 0.4 ms
Lead Channel Setting Pacing Pulse Width: 1 ms
Lead Channel Setting Sensing Sensitivity: 0.5 mV
Lead Channel Setting Sensing Sensitivity: 1 mV
Pulse Gen Serial Number: 227627

## 2021-04-07 NOTE — Progress Notes (Signed)
Remote ICD transmission.   

## 2021-05-10 DIAGNOSIS — N184 Chronic kidney disease, stage 4 (severe): Secondary | ICD-10-CM | POA: Diagnosis not present

## 2021-05-11 ENCOUNTER — Other Ambulatory Visit: Payer: Self-pay | Admitting: Interventional Cardiology

## 2021-05-17 DIAGNOSIS — E1122 Type 2 diabetes mellitus with diabetic chronic kidney disease: Secondary | ICD-10-CM | POA: Diagnosis not present

## 2021-05-17 DIAGNOSIS — I129 Hypertensive chronic kidney disease with stage 1 through stage 4 chronic kidney disease, or unspecified chronic kidney disease: Secondary | ICD-10-CM | POA: Diagnosis not present

## 2021-05-17 DIAGNOSIS — N184 Chronic kidney disease, stage 4 (severe): Secondary | ICD-10-CM | POA: Diagnosis not present

## 2021-05-17 DIAGNOSIS — D509 Iron deficiency anemia, unspecified: Secondary | ICD-10-CM | POA: Diagnosis not present

## 2021-05-17 DIAGNOSIS — R350 Frequency of micturition: Secondary | ICD-10-CM | POA: Diagnosis not present

## 2021-05-17 DIAGNOSIS — R351 Nocturia: Secondary | ICD-10-CM | POA: Diagnosis not present

## 2021-05-17 DIAGNOSIS — I5042 Chronic combined systolic (congestive) and diastolic (congestive) heart failure: Secondary | ICD-10-CM | POA: Diagnosis not present

## 2021-05-17 DIAGNOSIS — E559 Vitamin D deficiency, unspecified: Secondary | ICD-10-CM | POA: Diagnosis not present

## 2021-06-22 DIAGNOSIS — G894 Chronic pain syndrome: Secondary | ICD-10-CM | POA: Diagnosis not present

## 2021-06-22 DIAGNOSIS — M25561 Pain in right knee: Secondary | ICD-10-CM | POA: Diagnosis not present

## 2021-06-25 ENCOUNTER — Ambulatory Visit (INDEPENDENT_AMBULATORY_CARE_PROVIDER_SITE_OTHER): Payer: Medicare Other

## 2021-06-25 DIAGNOSIS — N184 Chronic kidney disease, stage 4 (severe): Secondary | ICD-10-CM | POA: Diagnosis not present

## 2021-06-25 DIAGNOSIS — I428 Other cardiomyopathies: Secondary | ICD-10-CM | POA: Diagnosis not present

## 2021-06-25 LAB — CUP PACEART REMOTE DEVICE CHECK
Battery Remaining Longevity: 72 mo
Battery Remaining Percentage: 82 %
Brady Statistic RA Percent Paced: 0 %
Brady Statistic RV Percent Paced: 98 %
Date Time Interrogation Session: 20221202021600
HighPow Impedance: 80 Ohm
Implantable Lead Implant Date: 20191129
Implantable Lead Implant Date: 20191129
Implantable Lead Implant Date: 20191129
Implantable Lead Location: 753858
Implantable Lead Location: 753859
Implantable Lead Location: 753860
Implantable Lead Model: 293
Implantable Lead Model: 4086
Implantable Lead Serial Number: 226691
Implantable Lead Serial Number: 440868
Implantable Pulse Generator Implant Date: 20191129
Lead Channel Impedance Value: 503 Ohm
Lead Channel Impedance Value: 615 Ohm
Lead Channel Impedance Value: 912 Ohm
Lead Channel Setting Pacing Amplitude: 2 V
Lead Channel Setting Pacing Amplitude: 2.5 V
Lead Channel Setting Pacing Amplitude: 3 V
Lead Channel Setting Pacing Pulse Width: 0.4 ms
Lead Channel Setting Pacing Pulse Width: 1 ms
Lead Channel Setting Sensing Sensitivity: 0.5 mV
Lead Channel Setting Sensing Sensitivity: 1 mV
Pulse Gen Serial Number: 227627

## 2021-06-29 ENCOUNTER — Encounter: Payer: Self-pay | Admitting: Physical Medicine & Rehabilitation

## 2021-06-29 ENCOUNTER — Encounter: Payer: Medicare Other | Attending: Physical Medicine & Rehabilitation | Admitting: Physical Medicine & Rehabilitation

## 2021-06-29 ENCOUNTER — Other Ambulatory Visit: Payer: Self-pay

## 2021-06-29 VITALS — BP 128/66 | HR 85 | Wt 243.2 lb

## 2021-06-29 DIAGNOSIS — Z5181 Encounter for therapeutic drug level monitoring: Secondary | ICD-10-CM

## 2021-06-29 DIAGNOSIS — G894 Chronic pain syndrome: Secondary | ICD-10-CM

## 2021-06-29 DIAGNOSIS — M170 Bilateral primary osteoarthritis of knee: Secondary | ICD-10-CM | POA: Diagnosis not present

## 2021-06-29 DIAGNOSIS — Z79891 Long term (current) use of opiate analgesic: Secondary | ICD-10-CM

## 2021-06-29 NOTE — Progress Notes (Signed)
Subjective:    Patient ID: Crystal [name], female    DOB: [birth year], 70 y.o.   MRN: [id number]  02/26/2021      Editor: Charlett Blake, MD (Physician)              Right knee Genicular nerve radiofrequency neurotomy x 3, Upper medial, Upper lateral , and Lower Medial under fluoroscopic guidance      HPI Left knee pain increasing to ~5/10, had genicular RF in March 2022, feels like it may be starting to wear off  Left sided post hip/buttocks pain which limits standing , hx of back surger in July 2021 for scatic nerve problems, current problems do not feel like sciatic  01/08/2020 Dr Zada Finders perfromed L4-5, L5-S1 TLIF, PLIF  Knee pain doing relatively well.    Hack/hip starting to bother her more, no numbness or tingling in leg, no new bowel or bladder dysfuction Pain Inventory Average Pain 8 Pain Right Now 8 My pain is burning, dull, and aching  In the last 24 hours, has pain interfered with the following? General activity 2 Relation with others 2 Enjoyment of life 3 What TIME of day is your pain at its worst? evening Sleep (in general) Good  Pain is worse with: walking, bending, standing, and some activites Pain improves with: rest, heat/ice, medication, and injections Relief from Meds: 5  Family History  Problem Relation Age of Onset   Hypertension Father    Heart disease Father    Cancer Father        prostate   Heart disease Other        (Maternal side) Ischemic heart disease   Diabetes Mellitus II Other    Arrhythmia Mother    Diabetes Mellitus II Mother        Borderline DM   Hypertension Mother    Asthma Mother    Cancer Brother    Dementia Brother    Hypertension Brother    Hypertension Daughter    Hypertension Daughter    Diabetes Daughter    Hypertension Daughter    Atrial fibrillation Daughter    GER disease Daughter    Hypertension Son    Anxiety disorder Son    Hypothyroidism Son    Breast cancer Maternal Grandmother        in her 80's    Social History   Socioeconomic History   Marital status: Widowed    Spouse name: Not on file   Number of children: Not on file   Years of education: Not on file   Highest education level: Not on file  Occupational History    Comment: retired LPN  Tobacco Use   Smoking status: Never   Smokeless tobacco: Never  Vaping Use   Vaping Use: Never used  Substance and Sexual Activity   Alcohol use: Not Currently   Drug use: Never   Sexual activity: Not Currently  Other Topics Concern   Not on file  Social History Narrative   Pt lives in [place] alone.  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      As of 03/15/17:   Diet: 1800 Calorie      Caffeine: Yes      Married, if yes what year: Widowed, married 1973      Do you live in a house, apartment, assisted living, [place], trailer, ect: House, 1 stories, and 1 person      Pets: No  Current/Past profession: LPN, retired       Exercise: Yes, walking          Living Will: Yes   DNR: No   POA/HPOA: Yes      Functional Status:   Do you have difficulty bathing or dressing yourself? No   Do you have difficulty preparing food or eating? No   Do you have difficulty managing your medications? No   Do you have difficulty managing your finances? No   Do you have difficulty affording your medications? Yes   Social Determinants of Health   Financial Resource Strain: Not on file  Food Insecurity: Not on file  Transportation Needs: Not on file  Physical Activity: Not on file  Stress: Not on file  Social Connections: Not on file   Past Surgical History:  Procedure Laterality Date   APPENDECTOMY  07/13/2017   [place]   "w/hysterectomy"   BOWEL RESECTION N/A 07/09/2018   Procedure: SMALL BOWEL RESECTION;  Surgeon: Judeth Horn, MD;  Location: [place];  Service: General;  Laterality: N/A;   CARDIAC CATHETERIZATION  2005   no obstructive CAD per patient   CARDIAC  CATHETERIZATION  2018   CARDIAC DEFIBRILLATOR PLACEMENT  2005   BiV ICD implanted,  LV lead is an epicardial lead   CARPAL TUNNEL RELEASE Left 07/25/2014   Dr.Williamson    CARPAL TUNNEL RELEASE Right 04/08/2019   Procedure: RIGHT CARPAL TUNNEL RELEASE ENDOSCOPIC;  Surgeon: Milly Jakob, MD;  Location: [place];  Service: Orthopedics;  Laterality: Right;   CATARACT EXTRACTION W/ INTRAOCULAR LENS  IMPLANT, BILATERAL Bilateral    COLONIC STENT PLACEMENT N/A 08/31/2017   Procedure: COLONIC STENT PLACEMENT;  Surgeon: Carol Ada, MD;  Location: [place];  Service: Endoscopy;  Laterality: N/A;   COLONOSCOPY     2010-2011 Dr.Kipreos    COLOSTOMY  12/2016   Archie Endo 01/26/2017   COLOSTOMY REVERSAL N/A 07/13/2017   Procedure: COLOSTOMY REVERSAL;  Surgeon: Judeth Horn, MD;  Location: [place];  Service: General;  Laterality: N/A;   CYST REMOVAL HAND Right 05/2013   thumb   DILATION AND CURETTAGE OF UTERUS  X 5-6   ESOPHAGOGASTRODUODENOSCOPY (EGD) WITH PROPOFOL N/A 03/13/2020   Procedure: ESOPHAGOGASTRODUODENOSCOPY (EGD) WITH PROPOFOL;  Surgeon: Carol Ada, MD;  [facility];  Service: Endoscopy;  Laterality: N/A;   EXCISION MASS ABDOMINAL N/A 07/09/2018   Procedure: EXPLORATION OF  ABDOMINAL WOUND ERAS PATHWAY;  Surgeon: Judeth Horn, MD;  Location: [place];  Service: General;  Laterality: N/A;   FLEXIBLE SIGMOIDOSCOPY N/A 08/24/2017   Procedure: Beryle Quant;  Surgeon: Carol Ada, MD;  Location: [place];  Service: Endoscopy;  Laterality: N/A;   FLEXIBLE SIGMOIDOSCOPY N/A 08/31/2017   Procedure: FLEXIBLE SIGMOIDOSCOPY;  Surgeon: Carol Ada, MD;  Location: [place];  Service: Endoscopy;  Laterality: N/A;  stent placement   FLEXIBLE SIGMOIDOSCOPY N/A 03/13/2020   Procedure: FLEXIBLE SIGMOIDOSCOPY;  Surgeon: Carol Ada, MD;  [facility];  Service: Endoscopy;  Laterality: N/A;   FLEXIBLE SIGMOIDOSCOPY N/A 12/18/2020   Procedure: FLEXIBLE SIGMOIDOSCOPY;   Surgeon: Carol Ada, MD;  [facility];  Service: Endoscopy;  Laterality: N/A;   HEMOSTASIS CLIP PLACEMENT  03/13/2020   Procedure: HEMOSTASIS CLIP PLACEMENT;  Surgeon: Carol Ada, MD;  [facility];  Service: Endoscopy;;   HEMOSTASIS CLIP PLACEMENT  12/18/2020   Procedure: HEMOSTASIS CLIP PLACEMENT;  Surgeon: Carol Ada, MD;  [facility];  Service: Endoscopy;;   HOT HEMOSTASIS N/A 03/13/2020   Procedure: HOT  HEMOSTASIS (ARGON PLASMA COAGULATION/BICAP);  Surgeon: Carol Ada, MD;  [facility];  Service: Endoscopy;  Laterality: N/A;   HOT HEMOSTASIS N/A 12/18/2020   Procedure: HOT HEMOSTASIS (ARGON PLASMA COAGULATION/BICAP);  Surgeon: Carol Ada, MD;  Location[facility];  Service: Endoscopy;  Laterality: N/A;   IMPLANTABLE CARDIOVERTER DEFIBRILLATOR GENERATOR CHANGE  2008   INSERTION OF MESH N/A 07/09/2018   Procedure: INSERTION OF VICRYL MESH;  Surgeon: Judeth Horn, MD;  Location: [place];  Service: General;  Laterality: N/A;   LAPAROTOMY N/A 09/18/2017   Procedure: EXPLORATORY LAPAROTOMY;  Surgeon: Judeth Horn, MD;  Location: [facility];  Service: General;  Laterality: N/A;   LEAD REVISION  06/22/2018   LEAD REVISION/REPAIR N/A 06/22/2018   Procedure: LEAD REVISION/REPAIR;  Surgeon: Deboraha Sprang, MD;  Location: [facility];  Service: Cardiovascular;  Laterality: N/A;   LYSIS OF ADHESION N/A 09/18/2017   Procedure: LYSIS OF ADHESION;  Surgeon: Judeth Horn, MD;  Location: [facility];  Service: General;  Laterality: N/A;   LYSIS OF ADHESION N/A 07/09/2018   Procedure: LYSIS OF ADHESION;  Surgeon: Judeth Horn, MD;  Location: [facility];  Service: General;  Laterality: N/A;   PARTIAL COLECTOMY N/A 09/18/2017   Procedure: ILEOCOLECTOMY;  Surgeon: Judeth Horn, MD;  Location: [facility];  Service: General;  Laterality: N/A;   PARTIAL COLECTOMY  09/25/2017   POLYPECTOMY  03/13/2020   Procedure: POLYPECTOMY;  Surgeon: Carol Ada, MD;  [facility];  Service: Endoscopy;;   RIGHT/LEFT HEART CATH AND CORONARY ANGIOGRAPHY N/A 06/01/2018   Procedure: RIGHT/LEFT HEART CATH AND CORONARY ANGIOGRAPHY;  Surgeon: Jettie Booze, MD;  Location: [facility];  Service: Cardiovascular;  Laterality: N/A;   SIGMOIDOSCOPY N/A 09/18/2017   Procedure: SIGMOIDOSCOPY;  Surgeon: Judeth Horn, MD;  Location: [facility];  Service: General;  Laterality: N/A;   SMALL INTESTINE SURGERY  07/09/2018   EXPLORATION OF  ABDOMINAL WOUND ERAS PATHWAYN/; MESH; LYSIS OF ADHESIONS   TOTAL ABDOMINAL HYSTERECTOMY  1990   TRANSFORAMINAL LUMBAR INTERBODY FUSION (TLIF) WITH PEDICLE SCREW FIXATION 2 LEVEL N/A 01/07/2020   Procedure: Lumbar Four-Five and Lumbar Five-Sacral One open lumbar decompression and transforaminal lumbar interbody fusion;  Surgeon: Judith Part, MD;  Location: [place];  Service: Neurosurgery;  Laterality: N/A;  Lumbar Four-Five and Lumbar Five-Sacral One open lumbar decompression and transforaminal lumbar interbody fusion   TRIGGER FINGER RELEASE Right 05/213   TRIGGER FINGER RELEASE Right 04/08/2019   Procedure: RIGHT INDEX RELEASE TRIGGER FINGER/A-1 PULLEY;  Surgeon: Milly Jakob, MD;  Location: [facility] [place];  Service: Orthopedics;  Laterality: Right;   TUBAL LIGATION  1980s   VESICOVAGINAL FISTULA CLOSURE W/ TAH     Past Surgical History:  Procedure Laterality Date   APPENDECTOMY  07/13/2017   BLADDER SUSPENSION  1990   "w/hysterectomy"   BOWEL RESECTION N/A 07/09/2018   Procedure: SMALL BOWEL RESECTION;  Surgeon: Judeth Horn, MD;  Location: [facility];  Service: General;  Laterality: N/A;   CARDIAC CATHETERIZATION  2005   no obstructive CAD per patient   CARDIAC CATHETERIZATION  2018   CARDIAC DEFIBRILLATOR PLACEMENT  2005   BiV ICD implanted,  LV lead is an epicardial lead   CARPAL TUNNEL RELEASE Left 07/25/2014   Dr.Williamson    CARPAL TUNNEL RELEASE Right 04/08/2019   Procedure: RIGHT CARPAL TUNNEL RELEASE ENDOSCOPIC;  Surgeon:  Milly Jakob, MD;  Location: [facility];  Service: Orthopedics;  Laterality: Right;   CATARACT EXTRACTION W/ INTRAOCULAR LENS  IMPLANT, BILATERAL Bilateral  COLONIC STENT PLACEMENT N/A 08/31/2017   Procedure: COLONIC STENT PLACEMENT;  Surgeon: Carol Ada, MD;  Location: [place];  Service: Endoscopy;  Laterality: N/A;   COLONOSCOPY     2010-2011 Dr.Kipreos    COLOSTOMY  12/2016   Archie Endo 01/26/2017   COLOSTOMY REVERSAL N/A 07/13/2017   Procedure: COLOSTOMY REVERSAL;  Surgeon: Judeth Horn, MD;  Location: [facility];  Service: General;  Laterality: N/A;   CYST REMOVAL HAND Right 05/2013   thumb   DILATION AND CURETTAGE OF UTERUS  X 5-6   ESOPHAGOGASTRODUODENOSCOPY (EGD) WITH PROPOFOL N/A 03/13/2020   Procedure: ESOPHAGOGASTRODUODENOSCOPY (EGD) WITH PROPOFOL;  Surgeon: Carol Ada, MD;  [facility];  Service: Endoscopy;  Laterality: N/A;   EXCISION MASS ABDOMINAL N/A 07/09/2018   Procedure: EXPLORATION OF  ABDOMINAL WOUND ERAS PATHWAY;  Surgeon: Judeth Horn, MD;  Location: [facility];  Service: General;  Laterality: N/A;   FLEXIBLE SIGMOIDOSCOPY N/A 08/24/2017   Procedure: Beryle Quant;  Surgeon: Carol Ada, MD;  Location: [facility];  Service: Endoscopy;  Laterality: N/A;   FLEXIBLE SIGMOIDOSCOPY N/A 08/31/2017   Procedure: FLEXIBLE SIGMOIDOSCOPY;  Surgeon: Carol Ada, MD;  Location: [facility];  Service: Endoscopy;  Laterality: N/A;  stent placement   FLEXIBLE SIGMOIDOSCOPY N/A 03/13/2020   Procedure: FLEXIBLE SIGMOIDOSCOPY;  Surgeon: Carol Ada, MD;  [facility];  Service: Endoscopy;  Laterality: N/A;   FLEXIBLE SIGMOIDOSCOPY N/A 12/18/2020   Procedure: FLEXIBLE SIGMOIDOSCOPY;  Surgeon: Carol Ada, MD;  [facility];  Service: Endoscopy;  Laterality: N/A;   HEMOSTASIS CLIP PLACEMENT  03/13/2020   Procedure: HEMOSTASIS CLIP PLACEMENT;  Surgeon: Carol Ada, MD;  [facility];  Service: Endoscopy;;   HEMOSTASIS CLIP PLACEMENT   12/18/2020   Procedure: HEMOSTASIS CLIP PLACEMENT;  Surgeon: Carol Ada, MD;  [facility];  Service: Endoscopy;;   HOT HEMOSTASIS N/A 03/13/2020   Procedure: HOT HEMOSTASIS (ARGON PLASMA COAGULATION/BICAP);  Surgeon: Carol Ada, MD;  [facility];  Service: Endoscopy;  Laterality: N/A;   HOT HEMOSTASIS N/A 12/18/2020   Procedure: HOT HEMOSTASIS (ARGON PLASMA COAGULATION/BICAP);  Surgeon: Carol Ada, MD;  Location[facility];  Service: Endoscopy;  Laterality: N/A;   IMPLANTABLE CARDIOVERTER DEFIBRILLATOR GENERATOR CHANGE  2008   INSERTION OF MESH N/A 07/09/2018   Procedure: INSERTION OF VICRYL MESH;  Surgeon: Judeth Horn, MD;  Location: [place];  Service: General;  Laterality: N/A;   LAPAROTOMY N/A 09/18/2017   Procedure: EXPLORATORY LAPAROTOMY;  Surgeon: Judeth Horn, MD;  Location: [place];  Service: General;  Laterality: N/A;   LEAD REVISION  06/22/2018   LEAD REVISION/REPAIR N/A 06/22/2018   Procedure: LEAD REVISION/REPAIR;  Surgeon: Deboraha Sprang, MD;  Location: [facility];  Service: Cardiovascular;  Laterality: N/A;   LYSIS OF ADHESION N/A 09/18/2017   Procedure: LYSIS OF ADHESION;  Surgeon: Judeth Horn, MD;  Location: [place];  Service: General;  Laterality: N/A;   LYSIS OF ADHESION N/A 07/09/2018   Procedure: LYSIS OF ADHESION;  Surgeon: Judeth Horn, MD;  Location: [facility];  Service: General;  Laterality: N/A;   PARTIAL COLECTOMY N/A 09/18/2017   Procedure: ILEOCOLECTOMY;  Surgeon: Judeth Horn, MD;  Location: [facility];  Service: General;  Laterality: N/A;   PARTIAL COLECTOMY  09/25/2017   POLYPECTOMY  03/13/2020   Procedure: POLYPECTOMY;  Surgeon: Carol Ada, MD;  [facility];  Service: Endoscopy;;   RIGHT/LEFT HEART CATH AND CORONARY ANGIOGRAPHY N/A 06/01/2018   Procedure: RIGHT/LEFT HEART CATH AND CORONARY ANGIOGRAPHY;  Surgeon: Jettie Booze, MD;  Location: [facility];  Service: Cardiovascular;  Laterality: N/A;    SIGMOIDOSCOPY N/A 09/18/2017   Procedure: SIGMOIDOSCOPY;  Surgeon: Judeth Horn, MD;  Location: [facility];  Service: General;  Laterality: N/A;   SMALL INTESTINE SURGERY  07/09/2018   EXPLORATION OF  ABDOMINAL WOUND ERAS PATHWAYN/; MESH; LYSIS OF ADHESIONS   TOTAL ABDOMINAL HYSTERECTOMY  1990   TRANSFORAMINAL LUMBAR INTERBODY FUSION (TLIF) WITH PEDICLE SCREW FIXATION 2 LEVEL N/A 01/07/2020   Procedure: Lumbar Four-Five and Lumbar Five-Sacral One open lumbar decompression and transforaminal lumbar interbody fusion;  Surgeon: Judith Part, MD;  Location: [facility];  Service: Neurosurgery;  Laterality: N/A;  Lumbar Four-Five and Lumbar Five-Sacral One open lumbar decompression and transforaminal lumbar interbody fusion   TRIGGER FINGER RELEASE Right 05/213   TRIGGER FINGER RELEASE Right 04/08/2019   Procedure: RIGHT INDEX RELEASE TRIGGER FINGER/A-1 PULLEY;  Surgeon: Milly Jakob, MD;  Location: [facility] [place];  Service: Orthopedics;  Laterality: Right;   TUBAL LIGATION  1980s   VESICOVAGINAL FISTULA CLOSURE W/ TAH     Past Medical History:  Diagnosis Date   AICD (automatic cardioverter/defibrillator) present    high RV threshold chronically, device was turned off in 2014; Device battery has been dead x 7 years; "turned it back on 06/22/2018"   Anemia, iron deficiency    Angioedema    felt to likely be due to ace inhibitors but says she has had this even off of medicines, appears to be tolerating ARBs chronically   Anxiety    Arthritis    "hands, legs, arms; bad in my back" (06/22/2018)   Asthmatic bronchitis with status asthmaticus    Bradycardia    CHF (congestive heart failure) (HCC)    Chronic lower back pain    Chronic pain    Chronic renal insufficiency    CKD (chronic kidney disease), stage III (HCC)    Coronary artery disease    Mild nonobstructive (30% LAD, 30% RCA) by 09/01/16 cath at [facility]   Degenerative joint disease    Depression    Diabetes mellitus, type 2 ([place])     Diabetic peripheral neuropathy ([place])    Dizziness    Dyspnea on exertion    Family history of adverse reaction to anesthesia    Mother has nausea   GERD (gastroesophageal reflux disease)    Gout    Heart valve disorder    History of blood transfusion 1981; ~ 2005; 12/2016   "childbirth; defibrillator OR; colostomy OR"   History of mononucleosis 03/2014   Hyperlipidemia    Hypertension    Hypothyroid    Insomnia    Intervertebral disc degeneration    Ischemic cardiomyopathy    LBBB (left bundle branch block)    Myocardial infarction ([place]) dx'd ~ 2005   Nonischemic cardiomyopathy ([place])    s/p ICD in 2005. EF has since recovered   Obesity    On home oxygen therapy    "2L at night" (06/22/2018)   OSA on CPAP    Pneumonia    "couple times; last time was 12/2016" (06/22/2018)   PONV (postoperative nausea and vomiting)    Presence of permanent cardiac pacemaker    [employer]   Swelling    Syncope    Systemic hypertension    Vitamin D deficiency    BP 128/66   Pulse 85   Wt 243 lb 3.2 oz (110.3 kg)   SpO2 92%   BMI 41.10 kg/m   Opioid Risk Score:   Fall  Risk Score:  `1  Depression screen PHQ 2/9  Depression screen [facility] 2/9 06/29/2021 02/26/2021 09/17/2020 08/20/2020 05/04/2018 10/11/2017 03/15/2017  Decreased Interest 0 0 0 0 0 0 0  Down, Depressed, Hopeless 0 0 0 0 0 1 0  PHQ - 2 Score 0 0 0 0 0 1 0  Altered sleeping - - - 3 - - -  Tired, decreased energy - - - 3 - - -  Change in appetite - - - - - - -  Feeling bad or failure about yourself  - - - 0 - - -  Trouble concentrating - - - 0 - - -  Moving slowly or fidgety/restless - - - 0 - - -  Suicidal thoughts - - - 0 - - -  PHQ-9 Score - - - 6 - - -      Review of Systems  Constitutional: Negative.   HENT: Negative.    Eyes: Negative.   Respiratory: Negative.    Cardiovascular: Negative.   Gastrointestinal: Negative.   Endocrine: Negative.   Genitourinary: Negative.   Musculoskeletal:  Positive for  arthralgias and gait problem.       Hip pain left and bilateral knees  Skin: Negative.   Allergic/Immunologic: Negative.   Hematological: Negative.   Psychiatric/Behavioral: Negative.    All other systems reviewed and are negative.     Objective:   Physical Exam Vitals and nursing note reviewed.  Constitutional:      Appearance: She is obese.  HENT:     Head: Normocephalic and atraumatic.  Eyes:     Extraocular Movements: Extraocular movements intact.     Conjunctiva/sclera: Conjunctivae normal.     Pupils: Pupils are equal, round, and reactive to light.  Skin:    General: Skin is warm and dry.  Neurological:     General: No focal deficit present.     Mental Status: She is alert and oriented to person, place, and time.  Psychiatric:        Mood and Affect: Mood normal.  Motor strength is 5/5 bilateral hip flexor knee extensor ankle dorsiflexor There is tenderness palpation at the left gluteus medius muscle.  This is reproducible some radiating pattern down the leg. No tenderness over the greater trochanter of the hip. Negative straight leg raising No tenderness over the PSIS area        Assessment & Plan:   1.  History of chronic low back pain lumbar postlaminectomy syndrome she has been on a stable dose of hydrocodone 10 mg twice daily she would like to switch to this clinic.  We will need urine drug screening, controlled substance agreement, follow-up with nurse practitioner in 1 month 2.  Acute exacerbation chronic low back pain appears to be more myofascial we will do trigger point injection left gluteus medius muscle today. Trigger Point Injection  Indication: Left gluteus medius myofascial pain not relieved by medication management and other conservative care.  Informed consent was obtained after describing risk and benefits of the procedure with the patient, this includes bleeding, bruising, infection and medication side effects.  The patient wishes to proceed and  has given written consent.  The patient was placed in a standing leaning on counter with elbows position.  The left gluteus medius area was marked and prepped with Betadine.  It was entered with a 25-gauge 1-1/2 inch needle and 1 mL of 1% lidocaine was injected into 1 trigger point, after negative draw back for blood.  The patient tolerated  the procedure well.  Post procedure instructions were given. 3.  Bilateral knee osteoarthritis with chronic pain improved after radiofrequency procedure of the knees.

## 2021-06-29 NOTE — Patient Instructions (Signed)
Trigger Point Injection Trigger points are areas where you have pain. A trigger point injection is a shot given in the trigger point to help relieve pain for a few days to a few months. Common places for trigger points include the neck, shoulders, upper back, or lower back. A trigger point injection will not cure long-term (chronic) pain permanently. These injections do not always work for every person. For some people, they can help to relieve pain for a few days to a few months. Tell a health care provider about: Any allergies you have. All medicines you are taking, including vitamins, herbs, eye drops, creams, and over-the-counter medicines. Any problems you or family members have had with anesthetic medicines. Any bleeding problems you have. Any surgeries you have had. Any medical conditions you have. Whether you are pregnant or may be pregnant. What are the risks? Generally, this is a safe procedure. However, problems may occur, including: Infection. Bleeding or bruising. Allergic reaction to the injected medicine. Irritation of the skin around the injection site. What happens before the procedure? Ask your health care provider about: Changing or stopping your regular medicines. This is especially important if you are taking diabetes medicines or blood thinners. Taking medicines such as aspirin and ibuprofen. These medicines can thin your blood. Do not take these medicines unless your health care provider tells you to take them. Taking over-the-counter medicines, vitamins, herbs, and supplements. What happens during the procedure?  Your health care provider will feel for trigger points. A marker may be used to circle the area for the injection. The skin over the trigger point will be washed with a germ-killing soap. You may be given a medicine to help you relax (sedative). A thin needle is used for the injection. You may feel pain or a twitching feeling when the needle enters your  skin. A numbing solution may be injected into the trigger point. Sometimes a medicine to keep down inflammation is also injected. Your health care provider may move the needle around the area where the trigger point is located until the tightness and twitching goes away. After the injection, your health care provider may put gentle pressure over the injection site. The injection site will be covered with a bandage (dressing). The procedure may vary among health care providers and hospitals. What can I expect after treatment? After treatment, you may have soreness and stiffness for 1-2 days. Follow these instructions at home: Injection site care Remove your dressing in a few hours, or as told by your health care provider. Check your injection site every day for signs of infection. Check for: Redness, swelling, or pain. Fluid or blood. Warmth. Pus or a bad smell. Managing pain, stiffness, and swelling If directed, put ice on the affected area. To do this: Put ice in a plastic bag. Place a towel between your skin and the bag. Leave the ice on for 20 minutes, 2-3 times a day. Remove the ice if your skin turns bright red. This is very important. If you cannot feel pain, heat, or cold, you have a greater risk of damage to the area. Activity If you were given a sedative during the procedure, it can affect you for several hours. Do not drive or operate machinery until your health care provider says that it is safe. Do not take baths, swim, or use a hot tub until your health care provider approves. Return to your normal activities as told by your health care provider. Ask your health care provider what   activities are safe for you. General instructions If you were asked to stop your regular medicines, ask your health care provider when you may start taking them again. You may be asked to see an occupational or physical therapist for exercises that reduce muscle strain and stretch the area of the  trigger point. Keep all follow-up visits. This is important. Contact a health care provider if: Your pain comes back, and it is worse than before the injection. You may need more injections. You have chills or a fever. The injection site becomes more painful, red, swollen, or warm to the touch. Summary A trigger point injection is a shot given in the trigger point to help relieve pain. Common places for trigger point injections are the neck, shoulders, upper back, and lower back. These injections do not always work for every person, but for some people, the injections can help to relieve pain for a few days to a few months. Contact a health care provider if symptoms come back or if they are worse than before treatment. Also, get help if the injection site becomes more painful, red, swollen, or warm to the touch. This information is not intended to replace advice given to you by your health care provider. Make sure you discuss any questions you have with your health care provider. Document Revised: 10/20/2020 Document Reviewed: 10/20/2020 [employer] Patient Education  [place].

## 2021-07-02 ENCOUNTER — Encounter (INDEPENDENT_AMBULATORY_CARE_PROVIDER_SITE_OTHER): Payer: Medicare Other | Admitting: Podiatry

## 2021-07-02 LAB — TOXASSURE SELECT,+ANTIDEPR,UR

## 2021-07-02 NOTE — Addendum Note (Signed)
Addended by: Gardiner Barefoot on: 07/02/2021 12:16 PM   Modules accepted: Level of Service

## 2021-07-02 NOTE — Progress Notes (Signed)
This encounter was created in error - please disregard.

## 2021-07-06 ENCOUNTER — Telehealth: Payer: Self-pay | Admitting: *Deleted

## 2021-07-06 NOTE — Telephone Encounter (Signed)
Urine drug screen for this encounter is consistent for prescribed medication 

## 2021-07-06 NOTE — Progress Notes (Signed)
Remote ICD transmission.   

## 2021-07-27 ENCOUNTER — Encounter: Payer: Medicare Other | Attending: Registered Nurse | Admitting: Registered Nurse

## 2021-07-27 ENCOUNTER — Encounter: Payer: Self-pay | Admitting: Registered Nurse

## 2021-07-27 ENCOUNTER — Other Ambulatory Visit: Payer: Self-pay

## 2021-07-27 VITALS — BP 113/70 | HR 77 | Temp 98.8°F | Ht 64.5 in | Wt 238.0 lb

## 2021-07-27 DIAGNOSIS — M7918 Myalgia, other site: Secondary | ICD-10-CM | POA: Diagnosis not present

## 2021-07-27 DIAGNOSIS — G8929 Other chronic pain: Secondary | ICD-10-CM | POA: Diagnosis not present

## 2021-07-27 DIAGNOSIS — M545 Low back pain, unspecified: Secondary | ICD-10-CM | POA: Insufficient documentation

## 2021-07-27 DIAGNOSIS — Z5181 Encounter for therapeutic drug level monitoring: Secondary | ICD-10-CM | POA: Insufficient documentation

## 2021-07-27 DIAGNOSIS — Z79891 Long term (current) use of opiate analgesic: Secondary | ICD-10-CM | POA: Insufficient documentation

## 2021-07-27 DIAGNOSIS — G894 Chronic pain syndrome: Secondary | ICD-10-CM | POA: Insufficient documentation

## 2021-07-27 DIAGNOSIS — M170 Bilateral primary osteoarthritis of knee: Secondary | ICD-10-CM | POA: Insufficient documentation

## 2021-07-27 MED ORDER — HYDROCODONE-ACETAMINOPHEN 10-325 MG PO TABS: 1.0000 | ORAL_TABLET | Freq: Two times a day (BID) | ORAL | 0 refills | Status: DC | PRN

## 2021-07-27 NOTE — Progress Notes (Signed)
Subjective:    Patient ID: Crystal [name], female    DOB: 23-May-1951, 71 y.o.   MRN: [id number]  HPI: Crystal [name] is a 71 y.o. female who returns for follow up appointment for chronic pain and medication refill. She states her pain is located in her lower back and bilateral knee pain. She  rates her pain 3. Her current exercise regime is walking, she was encouraged to increase HEP with chair exercises, she verbalizes understanding.    Last UDS was Performed on 06/29/2021, it was consistent.     Pain Inventory Average Pain 6  Pain Right Now 3 My pain is intermittent, burning, dull, and aching  In the last 24 hours, has pain interfered with the following? General activity 8 Relation with others 7 Enjoyment of life 8 What TIME of day is your pain at its worst? morning  and evening Sleep (in general) Good  Pain is worse with: walking, bending, standing, and some activites Pain improves with: rest, medication, and injections Relief from Meds: 8  Family History  Problem Relation Age of Onset   Hypertension Father    Heart disease Father    Cancer Father        prostate   Heart disease Other        (Maternal side) Ischemic heart disease   Diabetes Mellitus II Other    Arrhythmia Mother    Diabetes Mellitus II Mother        Borderline DM   Hypertension Mother    Asthma Mother    Cancer Brother    Dementia Brother    Hypertension Brother    Hypertension Daughter    Hypertension Daughter    Diabetes Daughter    Hypertension Daughter    Atrial fibrillation Daughter    GER disease Daughter    Hypertension Son    Anxiety disorder Son    Hypothyroidism Son    Breast cancer Maternal Grandmother        in her 71's   Social History   Socioeconomic History   Marital status: Widowed    Spouse name: Not on file   Number of children: Not on file   Years of education: Not on file   Highest education level: Not on file  Occupational History     Comment: retired LPN  Tobacco Use   Smoking status: Never   Smokeless tobacco: Never  Vaping Use   Vaping Use: Never used  Substance and Sexual Activity   Alcohol use: Not Currently   Drug use: Never   Sexual activity: Not Currently  Other Topics Concern   Not on file  Social History Narrative   Pt lives in [place].  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      As of 03/15/17:   Diet: 1800 Calorie      Caffeine: Yes      Married, if yes what year: Widowed, married 1973      Do you live in a house, apartment, assisted living, condo, trailer, ect: House, 1 stories, and 1 person      Pets: No      Current/Past profession: LPN, retired       Exercise: Yes, walking          Living Will: Yes   DNR: No   POA/HPOA: Yes      Functional Status:   Do you have difficulty bathing or dressing yourself? No  Do you have difficulty preparing food or eating? No   Do you have difficulty managing your medications? No   Do you have difficulty managing your finances? No   Do you have difficulty affording your medications? Yes   Social Determinants of Health   Financial Resource Strain: Not on file  Food Insecurity: Not on file  Transportation Needs: Not on file  Physical Activity: Not on file  Stress: Not on file  Social Connections: Not on file   Past Surgical History:  Procedure Laterality Date   APPENDECTOMY  07/13/2017   [facility]   "w/hysterectomy"   BOWEL RESECTION N/A 07/09/2018   Procedure: SMALL BOWEL RESECTION;  Surgeon: Judeth Horn, MD;  Location: [facility];  Service: General;  Laterality: N/A;   CARDIAC CATHETERIZATION  2005   no obstructive CAD per patient   CARDIAC CATHETERIZATION  2018   CARDIAC DEFIBRILLATOR PLACEMENT  2005   BiV ICD implanted,  LV lead is an epicardial lead   CARPAL TUNNEL RELEASE Left 07/25/2014   Dr.Williamson    CARPAL TUNNEL RELEASE Right 04/08/2019    Procedure: RIGHT CARPAL TUNNEL RELEASE ENDOSCOPIC;  Surgeon: Milly Jakob, MD;  Location: [facility];  Service: Orthopedics;  Laterality: Right;   CATARACT EXTRACTION W/ INTRAOCULAR LENS  IMPLANT, BILATERAL Bilateral    COLONIC STENT PLACEMENT N/A 08/31/2017   Procedure: COLONIC STENT PLACEMENT;  Surgeon: Carol Ada, MD;  Location: [facility];  Service: Endoscopy;  Laterality: N/A;   COLONOSCOPY     2010-2011 Dr.Kipreos    COLOSTOMY  12/2016   Archie Endo 01/26/2017   COLOSTOMY REVERSAL N/A 07/13/2017   Procedure: COLOSTOMY REVERSAL;  Surgeon: Judeth Horn, MD;  Location: [facility];  Service: General;  Laterality: N/A;   CYST REMOVAL HAND Right 05/2013   thumb   DILATION AND CURETTAGE OF UTERUS  X 5-6   ESOPHAGOGASTRODUODENOSCOPY (EGD) WITH PROPOFOL N/A 03/13/2020   Procedure: ESOPHAGOGASTRODUODENOSCOPY (EGD) WITH PROPOFOL;  Surgeon: Carol Ada, MD;  [facility];  Service: Endoscopy;  Laterality: N/A;   EXCISION MASS ABDOMINAL N/A 07/09/2018   Procedure: EXPLORATION OF  ABDOMINAL WOUND ERAS PATHWAY;  Surgeon: Judeth Horn, MD;  Location: [facility];  Service: General;  Laterality: N/A;   FLEXIBLE SIGMOIDOSCOPY N/A 08/24/2017   Procedure: Beryle Quant;  Surgeon: Carol Ada, MD;  Location: [facility];  Service: Endoscopy;  Laterality: N/A;   FLEXIBLE SIGMOIDOSCOPY N/A 08/31/2017   Procedure: FLEXIBLE SIGMOIDOSCOPY;  Surgeon: Carol Ada, MD;  Location: [facility];  Service: Endoscopy;  Laterality: N/A;  stent placement   FLEXIBLE SIGMOIDOSCOPY N/A 03/13/2020   Procedure: FLEXIBLE SIGMOIDOSCOPY;  Surgeon: Carol Ada, MD;  [facility];  Service: Endoscopy;  Laterality: N/A;   FLEXIBLE SIGMOIDOSCOPY N/A 12/18/2020   Procedure: FLEXIBLE SIGMOIDOSCOPY;  Surgeon: Carol Ada, MD;  [facility];  Service: Endoscopy;  Laterality: N/A;   HEMOSTASIS CLIP PLACEMENT  03/13/2020   Procedure: HEMOSTASIS CLIP PLACEMENT;  Surgeon: Carol Ada, MD;   [facility];  Service: Endoscopy;;   HEMOSTASIS CLIP PLACEMENT  12/18/2020   Procedure: HEMOSTASIS CLIP PLACEMENT;  Surgeon: Carol Ada, MD;  [facility];  Service: Endoscopy;;   HOT HEMOSTASIS N/A 03/13/2020   Procedure: HOT HEMOSTASIS (ARGON PLASMA COAGULATION/BICAP);  Surgeon: Carol Ada, MD;  [facility];  Service: Endoscopy;  Laterality: N/A;   HOT HEMOSTASIS N/A 12/18/2020   Procedure: HOT HEMOSTASIS (ARGON PLASMA COAGULATION/BICAP);  Surgeon: Carol Ada, MD;  [facility];  Service: Endoscopy;  Laterality: N/A;   IMPLANTABLE CARDIOVERTER DEFIBRILLATOR  GENERATOR CHANGE  2008   INSERTION OF MESH N/A 07/09/2018   Procedure: INSERTION OF VICRYL MESH;  Surgeon: Judeth Horn, MD;  Location: [facility];  Service: General;  Laterality: N/A;   LAPAROTOMY N/A 09/18/2017   Procedure: EXPLORATORY LAPAROTOMY;  Surgeon: Judeth Horn, MD;  Location: [facility];  Service: General;  Laterality: N/A;   LEAD REVISION  06/22/2018   LEAD REVISION/REPAIR N/A 06/22/2018   Procedure: LEAD REVISION/REPAIR;  Surgeon: Deboraha Sprang, MD;  Location: [facility];  Service: Cardiovascular;  Laterality: N/A;   LYSIS OF ADHESION N/A 09/18/2017   Procedure: LYSIS OF ADHESION;  Surgeon: Judeth Horn, MD;  Location: [facility];  Service: General;  Laterality: N/A;   LYSIS OF ADHESION N/A 07/09/2018   Procedure: LYSIS OF ADHESION;  Surgeon: Judeth Horn, MD;  Location: [facility];  Service: General;  Laterality: N/A;   PARTIAL COLECTOMY N/A 09/18/2017   Procedure: ILEOCOLECTOMY;  Surgeon: Judeth Horn, MD;  Location: [facility];  Service: General;  Laterality: N/A;   PARTIAL COLECTOMY  09/25/2017   POLYPECTOMY  03/13/2020   Procedure: POLYPECTOMY;  Surgeon: Carol Ada, MD;  [facility];  Service: Endoscopy;;   RIGHT/LEFT HEART CATH AND CORONARY ANGIOGRAPHY N/A 06/01/2018   Procedure: RIGHT/LEFT HEART CATH AND CORONARY ANGIOGRAPHY;  Surgeon: Jettie Booze, MD;   Location: [facility];  Service: Cardiovascular;  Laterality: N/A;   SIGMOIDOSCOPY N/A 09/18/2017   Procedure: SIGMOIDOSCOPY;  Surgeon: Judeth Horn, MD;  Location: [facility];  Service: General;  Laterality: N/A;   SMALL INTESTINE SURGERY  07/09/2018   EXPLORATION OF  ABDOMINAL WOUND ERAS PATHWAYN/; MESH; LYSIS OF ADHESIONS   TOTAL ABDOMINAL HYSTERECTOMY  1990   TRANSFORAMINAL LUMBAR INTERBODY FUSION (TLIF) WITH PEDICLE SCREW FIXATION 2 LEVEL N/A 01/07/2020   Procedure: Lumbar Four-Five and Lumbar Five-Sacral One open lumbar decompression and transforaminal lumbar interbody fusion;  Surgeon: Judith Part, MD;  Location: [facility];  Service: Neurosurgery;  Laterality: N/A;  Lumbar Four-Five and Lumbar Five-Sacral One open lumbar decompression and transforaminal lumbar interbody fusion   TRIGGER FINGER RELEASE Right 05/213   TRIGGER FINGER RELEASE Right 04/08/2019   Procedure: RIGHT INDEX RELEASE TRIGGER FINGER/A-1 PULLEY;  Surgeon: Milly Jakob, MD;  Location: [facility];  Service: Orthopedics;  Laterality: Right;   TUBAL LIGATION  1980s   VESICOVAGINAL FISTULA CLOSURE W/ TAH     Past Surgical History:  Procedure Laterality Date   APPENDECTOMY  07/13/2017   BLADDER SUSPENSION  1990   "w/hysterectomy"   BOWEL RESECTION N/A 07/09/2018   Procedure: SMALL BOWEL RESECTION;  Surgeon: Judeth Horn, MD;  Location: [facility];  Service: General;  Laterality: N/A;   CARDIAC CATHETERIZATION  2005   no obstructive CAD per patient   CARDIAC CATHETERIZATION  2018   CARDIAC DEFIBRILLATOR PLACEMENT  2005   BiV ICD implanted,  LV lead is an epicardial lead   CARPAL TUNNEL RELEASE Left 07/25/2014   Dr.Williamson    CARPAL TUNNEL RELEASE Right 04/08/2019   Procedure: RIGHT CARPAL TUNNEL RELEASE ENDOSCOPIC;  Surgeon: Milly Jakob, MD;  Location: [facility];  Service: Orthopedics;  Laterality: Right;   CATARACT EXTRACTION W/ INTRAOCULAR LENS  IMPLANT, BILATERAL Bilateral    COLONIC STENT  PLACEMENT N/A 08/31/2017   Procedure: COLONIC STENT PLACEMENT;  Surgeon: Carol Ada, MD;  Location: [facility];  Service: Endoscopy;  Laterality: N/A;   COLONOSCOPY     2010-2011 Dr.Kipreos    COLOSTOMY  12/2016   Archie Endo 01/26/2017   COLOSTOMY REVERSAL N/A  07/13/2017   Procedure: COLOSTOMY REVERSAL;  Surgeon: Judeth Horn, MD;  Location: [facility];  Service: General;  Laterality: N/A;   CYST REMOVAL HAND Right 05/2013   thumb   DILATION AND CURETTAGE OF UTERUS  X 5-6   ESOPHAGOGASTRODUODENOSCOPY (EGD) WITH PROPOFOL N/A 03/13/2020   Procedure: ESOPHAGOGASTRODUODENOSCOPY (EGD) WITH PROPOFOL;  Surgeon: Carol Ada, MD;  [facility];  Service: Endoscopy;  Laterality: N/A;   EXCISION MASS ABDOMINAL N/A 07/09/2018   Procedure: EXPLORATION OF  ABDOMINAL WOUND ERAS PATHWAY;  Surgeon: Judeth Horn, MD;  Location: [facility];  Service: General;  Laterality: N/A;   FLEXIBLE SIGMOIDOSCOPY N/A 08/24/2017   Procedure: Beryle Quant;  Surgeon: Carol Ada, MD;  Location: [facility];  Service: Endoscopy;  Laterality: N/A;   FLEXIBLE SIGMOIDOSCOPY N/A 08/31/2017   Procedure: FLEXIBLE SIGMOIDOSCOPY;  Surgeon: Carol Ada, MD;  Location: [facility];  Service: Endoscopy;  Laterality: N/A;  stent placement   FLEXIBLE SIGMOIDOSCOPY N/A 03/13/2020   Procedure: FLEXIBLE SIGMOIDOSCOPY;  Surgeon: Carol Ada, MD;  [facility];  Service: Endoscopy;  Laterality: N/A;   FLEXIBLE SIGMOIDOSCOPY N/A 12/18/2020   Procedure: FLEXIBLE SIGMOIDOSCOPY;  Surgeon: Carol Ada, MD;  [facility];  Service: Endoscopy;  Laterality: N/A;   HEMOSTASIS CLIP PLACEMENT  03/13/2020   Procedure: HEMOSTASIS CLIP PLACEMENT;  Surgeon: Carol Ada, MD;  [facility];  Service: Endoscopy;;   HEMOSTASIS CLIP PLACEMENT  12/18/2020   Procedure: HEMOSTASIS CLIP PLACEMENT;  Surgeon: Carol Ada, MD;  [facility];  Service: Endoscopy;;   HOT HEMOSTASIS N/A 03/13/2020    Procedure: HOT HEMOSTASIS (ARGON PLASMA COAGULATION/BICAP);  Surgeon: Carol Ada, MD;  [facility];  Service: Endoscopy;  Laterality: N/A;   HOT HEMOSTASIS N/A 12/18/2020   Procedure: HOT HEMOSTASIS (ARGON PLASMA COAGULATION/BICAP);  Surgeon: Carol Ada, MD;  Location[facility];  Service: Endoscopy;  Laterality: N/A;   IMPLANTABLE CARDIOVERTER DEFIBRILLATOR GENERATOR CHANGE  2008   INSERTION OF MESH N/A 07/09/2018   Procedure: INSERTION OF VICRYL MESH;  Surgeon: Judeth Horn, MD;  Location: [facility];  Service: General;  Laterality: N/A;   LAPAROTOMY N/A 09/18/2017   Procedure: EXPLORATORY LAPAROTOMY;  Surgeon: Judeth Horn, MD;  Location: [facility];  Service: General;  Laterality: N/A;   LEAD REVISION  06/22/2018   LEAD REVISION/REPAIR N/A 06/22/2018   Procedure: LEAD REVISION/REPAIR;  Surgeon: Deboraha Sprang, MD;  Location: [facility];  Service: Cardiovascular;  Laterality: N/A;   LYSIS OF ADHESION N/A 09/18/2017   Procedure: LYSIS OF ADHESION;  Surgeon: Judeth Horn, MD;  Location: [facility];  Service: General;  Laterality: N/A;   LYSIS OF ADHESION N/A 07/09/2018   Procedure: LYSIS OF ADHESION;  Surgeon: Judeth Horn, MD;  Location: [facility];  Service: General;  Laterality: N/A;   PARTIAL COLECTOMY N/A 09/18/2017   Procedure: ILEOCOLECTOMY;  Surgeon: Judeth Horn, MD;  Location: [facility];  Service: General;  Laterality: N/A;   PARTIAL COLECTOMY  09/25/2017   POLYPECTOMY  03/13/2020   Procedure: POLYPECTOMY;  Surgeon: Carol Ada, MD;  [facility];  Service: Endoscopy;;   RIGHT/LEFT HEART CATH AND CORONARY ANGIOGRAPHY N/A 06/01/2018   Procedure: RIGHT/LEFT HEART CATH AND CORONARY ANGIOGRAPHY;  Surgeon: Jettie Booze, MD;  Location: [facility];  Service: Cardiovascular;  Laterality: N/A;   SIGMOIDOSCOPY N/A 09/18/2017   Procedure: SIGMOIDOSCOPY;  Surgeon: Judeth Horn, MD;  Location: [facility];  Service: General;  Laterality: N/A;   SMALL INTESTINE  SURGERY  07/09/2018   EXPLORATION OF  ABDOMINAL  WOUND ERAS PATHWAYN/; MESH; LYSIS OF ADHESIONS   TOTAL ABDOMINAL HYSTERECTOMY  1990   TRANSFORAMINAL LUMBAR INTERBODY FUSION (TLIF) WITH PEDICLE SCREW FIXATION 2 LEVEL N/A 01/07/2020   Procedure: Lumbar Four-Five and Lumbar Five-Sacral One open lumbar decompression and transforaminal lumbar interbody fusion;  Surgeon: Judith Part, MD;  Location: [facility];  Service: Neurosurgery;  Laterality: N/A;  Lumbar Four-Five and Lumbar Five-Sacral One open lumbar decompression and transforaminal lumbar interbody fusion   TRIGGER FINGER RELEASE Right 05/213   TRIGGER FINGER RELEASE Right 04/08/2019   Procedure: RIGHT INDEX RELEASE TRIGGER FINGER/A-1 PULLEY;  Surgeon: Milly Jakob, MD;  Location: [facility];  Service: Orthopedics;  Laterality: Right;   TUBAL LIGATION  1980s   VESICOVAGINAL FISTULA CLOSURE W/ TAH     Past Medical History:  Diagnosis Date   AICD (automatic cardioverter/defibrillator) present    high RV threshold chronically, device was turned off in 2014; Device battery has been dead x 7 years; "turned it back on 06/22/2018"   Anemia, iron deficiency    Angioedema    felt to likely be due to ace inhibitors but says she has had this even off of medicines, appears to be tolerating ARBs chronically   Anxiety    Arthritis    "hands, legs, arms; bad in my back" (06/22/2018)   Asthmatic bronchitis with status asthmaticus    Bradycardia    CHF (congestive heart failure) (HCC)    Chronic lower back pain    Chronic pain    Chronic renal insufficiency    CKD (chronic kidney disease), stage III (HCC)    Coronary artery disease    Mild nonobstructive (30% LAD, 30% RCA) by 09/01/16 cath at [facility]   Degenerative joint disease    Depression    Diabetes mellitus, type 2 ([place])    Diabetic peripheral neuropathy ([place])    Dizziness    Dyspnea on exertion    Family history of adverse reaction to anesthesia     Mother has nausea   GERD (gastroesophageal reflux disease)    Gout    Heart valve disorder    History of blood transfusion 1981; ~ 2005; 12/2016   "childbirth; defibrillator OR; colostomy OR"   History of mononucleosis 03/2014   Hyperlipidemia    Hypertension    Hypothyroid    Insomnia    Intervertebral disc degeneration    Ischemic cardiomyopathy    LBBB (left bundle branch block)    Myocardial infarction ([place]) dx'd ~ 2005   Nonischemic cardiomyopathy ([place])    s/p ICD in 2005. EF has since recovered   Obesity    On home oxygen therapy    "2L at night" (06/22/2018)   OSA on CPAP    Pneumonia    "couple times; last time was 12/2016" (06/22/2018)   PONV (postoperative nausea and vomiting)    Presence of permanent cardiac pacemaker    [employer]   Swelling    Syncope    Systemic hypertension    Vitamin D deficiency    BP 113/70    Pulse 77    Temp 98.8 F (37.1 C)    Ht 5' 4.5" (1.638 m)    Wt 238 lb (108 kg)    SpO2 95%    BMI 40.22 kg/m   Opioid Risk Score:   Fall Risk Score:  `1  Depression screen PHQ 2/9  Depression screen [facility] 2/9 06/29/2021 02/26/2021 09/17/2020 08/20/2020 05/04/2018 10/11/2017 03/15/2017  Decreased Interest 0 0 0 0 0 0 0  Down, Depressed, Hopeless 0 0 0 0 0 1 0  PHQ - 2 Score 0 0 0 0 0 1 0  Altered sleeping - - - 3 - - -  Tired, decreased energy - - - 3 - - -  Change in appetite - - - - - - -  Feeling bad or failure about yourself  - - - 0 - - -  Trouble concentrating - - - 0 - - -  Moving slowly or fidgety/restless - - - 0 - - -  Suicidal thoughts - - - 0 - - -  PHQ-9 Score - - - 6 - - -     Review of Systems  Musculoskeletal:  Positive for gait problem.       Pain in both knees  All other systems reviewed and are negative.     Objective:   Physical Exam Vitals and nursing note reviewed.  Constitutional:      Appearance: Normal appearance. She is obese.  Cardiovascular:     Rate and Rhythm: Normal rate and  regular rhythm.     Pulses: Normal pulses.     Heart sounds: Normal heart sounds.  Pulmonary:     Effort: Pulmonary effort is normal.     Breath sounds: Normal breath sounds.  Musculoskeletal:     Cervical back: Normal range of motion and neck supple.     Comments: Normal Muscle Bulk and Muscle Testing Reveals:  Upper Extremities: Full ROM and Muscle Strength 5/5 Lumbar Paraspinal Tenderness: L-1-L-2  L-4-L-5 Lower Extremities: Full ROM and Muscle Strength 5/5 Right Lower Extremity Flexion Produces Pain into her Right Patella Arises from Table slowly using walker for support Antalgic  Gait     Skin:    General: Skin is warm and dry.  Neurological:     Mental Status: She is alert and oriented to person, place, and time.  Psychiatric:        Mood and Affect: Mood normal.        Behavior: Behavior normal.         Assessment & Plan:  Chronic Low Back Pain: Encouraged to increase HEP as Tolerated. Continue to Monitor.   Bilateral Primary Osteoarthritis: S/P Right knee Genicular nerve radiofrequency neurotomy x 3, Upper medial, Upper lateral , and Lower Medial under fluoroscopic guidance on 02/26/2021, with good relief noted.  3. Myofascial Pain: S/P Trigger Point Injection with Good Relief Noted.  4. Chronic Pain Syndrome: RX: Hydrocodone 10/325 mg one tablet twice a day as needed for pain #60. We will continue the opioid monitoring program, this consists of regular clinic visits, examinations, urine drug screen, pill counts as well as use of [place] Controlled Substance Reporting system. A 12 month History has been reviewed on the [place] Controlled Substance Reporting System on 07/27/2021.  F/U in 1 month

## 2021-08-03 DIAGNOSIS — R059 Cough, unspecified: Secondary | ICD-10-CM | POA: Diagnosis not present

## 2021-08-03 DIAGNOSIS — D649 Anemia, unspecified: Secondary | ICD-10-CM | POA: Diagnosis not present

## 2021-08-03 DIAGNOSIS — E1129 Type 2 diabetes mellitus with other diabetic kidney complication: Secondary | ICD-10-CM | POA: Diagnosis not present

## 2021-08-03 DIAGNOSIS — E785 Hyperlipidemia, unspecified: Secondary | ICD-10-CM | POA: Diagnosis not present

## 2021-08-03 DIAGNOSIS — N184 Chronic kidney disease, stage 4 (severe): Secondary | ICD-10-CM | POA: Diagnosis not present

## 2021-08-03 DIAGNOSIS — G4733 Obstructive sleep apnea (adult) (pediatric): Secondary | ICD-10-CM | POA: Diagnosis not present

## 2021-08-03 DIAGNOSIS — I10 Essential (primary) hypertension: Secondary | ICD-10-CM | POA: Diagnosis not present

## 2021-08-03 DIAGNOSIS — E1159 Type 2 diabetes mellitus with other circulatory complications: Secondary | ICD-10-CM | POA: Diagnosis not present

## 2021-08-03 DIAGNOSIS — I509 Heart failure, unspecified: Secondary | ICD-10-CM | POA: Diagnosis not present

## 2021-08-03 DIAGNOSIS — G609 Hereditary and idiopathic neuropathy, unspecified: Secondary | ICD-10-CM | POA: Diagnosis not present

## 2021-08-03 DIAGNOSIS — Z95810 Presence of automatic (implantable) cardiac defibrillator: Secondary | ICD-10-CM | POA: Diagnosis not present

## 2021-08-03 DIAGNOSIS — E039 Hypothyroidism, unspecified: Secondary | ICD-10-CM | POA: Diagnosis not present

## 2021-08-10 DIAGNOSIS — I5042 Chronic combined systolic (congestive) and diastolic (congestive) heart failure: Secondary | ICD-10-CM | POA: Diagnosis not present

## 2021-08-10 DIAGNOSIS — N2581 Secondary hyperparathyroidism of renal origin: Secondary | ICD-10-CM | POA: Diagnosis not present

## 2021-08-10 DIAGNOSIS — E1122 Type 2 diabetes mellitus with diabetic chronic kidney disease: Secondary | ICD-10-CM | POA: Diagnosis not present

## 2021-08-10 DIAGNOSIS — I129 Hypertensive chronic kidney disease with stage 1 through stage 4 chronic kidney disease, or unspecified chronic kidney disease: Secondary | ICD-10-CM | POA: Diagnosis not present

## 2021-08-10 DIAGNOSIS — N184 Chronic kidney disease, stage 4 (severe): Secondary | ICD-10-CM | POA: Diagnosis not present

## 2021-08-10 DIAGNOSIS — D509 Iron deficiency anemia, unspecified: Secondary | ICD-10-CM | POA: Diagnosis not present

## 2021-08-12 DIAGNOSIS — U071 COVID-19: Secondary | ICD-10-CM | POA: Diagnosis not present

## 2021-08-22 DIAGNOSIS — I130 Hypertensive heart and chronic kidney disease with heart failure and stage 1 through stage 4 chronic kidney disease, or unspecified chronic kidney disease: Secondary | ICD-10-CM | POA: Diagnosis not present

## 2021-08-22 DIAGNOSIS — N184 Chronic kidney disease, stage 4 (severe): Secondary | ICD-10-CM | POA: Diagnosis not present

## 2021-08-22 DIAGNOSIS — E785 Hyperlipidemia, unspecified: Secondary | ICD-10-CM | POA: Diagnosis not present

## 2021-08-22 DIAGNOSIS — I10 Essential (primary) hypertension: Secondary | ICD-10-CM | POA: Diagnosis not present

## 2021-08-25 ENCOUNTER — Encounter: Payer: Self-pay | Admitting: Registered Nurse

## 2021-08-25 ENCOUNTER — Encounter: Payer: Medicare Other | Attending: Registered Nurse | Admitting: Registered Nurse

## 2021-08-25 ENCOUNTER — Other Ambulatory Visit: Payer: Self-pay

## 2021-08-25 VITALS — BP 104/65 | HR 78 | Ht 64.5 in | Wt 244.0 lb

## 2021-08-25 DIAGNOSIS — G8929 Other chronic pain: Secondary | ICD-10-CM | POA: Diagnosis not present

## 2021-08-25 DIAGNOSIS — M7918 Myalgia, other site: Secondary | ICD-10-CM | POA: Insufficient documentation

## 2021-08-25 DIAGNOSIS — M170 Bilateral primary osteoarthritis of knee: Secondary | ICD-10-CM | POA: Diagnosis not present

## 2021-08-25 DIAGNOSIS — M545 Low back pain, unspecified: Secondary | ICD-10-CM | POA: Insufficient documentation

## 2021-08-25 DIAGNOSIS — G894 Chronic pain syndrome: Secondary | ICD-10-CM | POA: Diagnosis not present

## 2021-08-25 DIAGNOSIS — Z5181 Encounter for therapeutic drug level monitoring: Secondary | ICD-10-CM | POA: Insufficient documentation

## 2021-08-25 DIAGNOSIS — Z79891 Long term (current) use of opiate analgesic: Secondary | ICD-10-CM | POA: Diagnosis not present

## 2021-08-25 MED ORDER — HYDROCODONE-ACETAMINOPHEN 10-325 MG PO TABS: 1.0000 | ORAL_TABLET | Freq: Two times a day (BID) | ORAL | 0 refills | Status: DC | PRN

## 2021-08-25 NOTE — Progress Notes (Signed)
Subjective:    Patient ID: Crystal [name], female    DOB: [birth year], 71 y.o.   MRN: [id number]  [id number] L Askin is a 71 y.o. female who returns for follow up appointment for chronic pain and medication refill. She  states her pain is located in her lower back and bilateral knee pain. She rates her pain 7. Her current exercise regime is walking and performing stretching exercises.  Ms. Neuberger Morphine equivalent is 20.00 MME.   Last UDS was Performed on 06/29/2021, it was consistent.     Pain Inventory Average Pain 8 Pain Right Now 7 My pain is burning, dull, and aching  In the last 24 hours, has pain interfered with the following? General activity 7 Relation with others 7 Enjoyment of life 7 What TIME of day is your pain at its worst? morning , daytime, and evening Sleep (in general) Fair  Pain is worse with: walking, bending, standing, and some activites Pain improves with: rest, medication, and injections Relief from Meds: 5  Family History  Problem Relation Age of Onset   Hypertension Father    Heart disease Father    Cancer Father        prostate   Heart disease Other        (Maternal side) Ischemic heart disease   Diabetes Mellitus II Other    Arrhythmia Mother    Diabetes Mellitus II Mother        Borderline DM   Hypertension Mother    Asthma Mother    Cancer Brother    Dementia Brother    Hypertension Brother    Hypertension Daughter    Hypertension Daughter    Diabetes Daughter    Hypertension Daughter    Atrial fibrillation Daughter    GER disease Daughter    Hypertension Son    Anxiety disorder Son    Hypothyroidism Son    Breast cancer Maternal Grandmother        in her 76's   Social History   Socioeconomic History   Marital status: Widowed    Spouse name: Not on file   Number of children: Not on file   Years of education: Not on file   Highest education level: Not on file  Occupational History    Comment: retired LPN  Tobacco Use    Smoking status: Never   Smokeless tobacco: Never  Vaping Use   Vaping Use: Never used  Substance and Sexual Activity   Alcohol use: Not Currently   Drug use: Never   Sexual activity: Not Currently  Other Topics Concern   Not on file  Social History Narrative   Pt lives in [place].  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      As of 03/15/17:   Diet: 1800 Calorie      Caffeine: Yes      Married, if yes what year: Widowed, married 1973      Do you live in a house, apartment, assisted living, condo, trailer, ect: House, 1 stories, and 1 person      Pets: No      Current/Past profession: LPN, retired       Exercise: Yes, walking          Living Will: Yes   DNR: No   POA/HPOA: Yes      Functional Status:   Do you have difficulty bathing or dressing yourself? No   Do you  have difficulty preparing food or eating? No   Do you have difficulty managing your medications? No   Do you have difficulty managing your finances? No   Do you have difficulty affording your medications? Yes   Social Determinants of Health   Financial Resource Strain: Not on file  Food Insecurity: Not on file  Transportation Needs: Not on file  Physical Activity: Not on file  Stress: Not on file  Social Connections: Not on file   Past Surgical History:  Procedure Laterality Date   APPENDECTOMY  07/13/2017   [place]   "w/hysterectomy"   BOWEL RESECTION N/A 07/09/2018   Procedure: SMALL BOWEL RESECTION;  Surgeon: Judeth Horn, MD;  Location: [facility];  Service: General;  Laterality: N/A;   CARDIAC CATHETERIZATION  2005   no obstructive CAD per patient   CARDIAC CATHETERIZATION  2018   CARDIAC DEFIBRILLATOR PLACEMENT  2005   BiV ICD implanted,  LV lead is an epicardial lead   CARPAL TUNNEL RELEASE Left 07/25/2014   Dr.Williamson    CARPAL TUNNEL RELEASE Right 04/08/2019   Procedure: RIGHT CARPAL TUNNEL RELEASE ENDOSCOPIC;   Surgeon: Milly Jakob, MD;  Location: [facility];  Service: Orthopedics;  Laterality: Right;   CATARACT EXTRACTION W/ INTRAOCULAR LENS  IMPLANT, BILATERAL Bilateral    COLONIC STENT PLACEMENT N/A 08/31/2017   Procedure: COLONIC STENT PLACEMENT;  Surgeon: Carol Ada, MD;  Location: [facility];  Service: Endoscopy;  Laterality: N/A;   COLONOSCOPY     2010-2011 Dr.Kipreos    COLOSTOMY  12/2016   Archie Endo 01/26/2017   COLOSTOMY REVERSAL N/A 07/13/2017   Procedure: COLOSTOMY REVERSAL;  Surgeon: Judeth Horn, MD;  Location: [facility];  Service: General;  Laterality: N/A;   CYST REMOVAL HAND Right 05/2013   thumb   DILATION AND CURETTAGE OF UTERUS  X 5-6   ESOPHAGOGASTRODUODENOSCOPY (EGD) WITH PROPOFOL N/A 03/13/2020   Procedure: ESOPHAGOGASTRODUODENOSCOPY (EGD) WITH PROPOFOL;  Surgeon: Carol Ada, MD;  [facility];  Service: Endoscopy;  Laterality: N/A;   EXCISION MASS ABDOMINAL N/A 07/09/2018   Procedure: EXPLORATION OF  ABDOMINAL WOUND ERAS PATHWAY;  Surgeon: Judeth Horn, MD;  Location: [facility];  Service: General;  Laterality: N/A;   FLEXIBLE SIGMOIDOSCOPY N/A 08/24/2017   Procedure: Beryle Quant;  Surgeon: Carol Ada, MD;  Location: [facility];  Service: Endoscopy;  Laterality: N/A;   FLEXIBLE SIGMOIDOSCOPY N/A 08/31/2017   Procedure: FLEXIBLE SIGMOIDOSCOPY;  Surgeon: Carol Ada, MD;  Location: [facility];  Service: Endoscopy;  Laterality: N/A;  stent placement   FLEXIBLE SIGMOIDOSCOPY N/A 03/13/2020   Procedure: FLEXIBLE SIGMOIDOSCOPY;  Surgeon: Carol Ada, MD;  [facility];  Service: Endoscopy;  Laterality: N/A;   FLEXIBLE SIGMOIDOSCOPY N/A 12/18/2020   Procedure: FLEXIBLE SIGMOIDOSCOPY;  Surgeon: Carol Ada, MD;  [facility];  Service: Endoscopy;  Laterality: N/A;   HEMOSTASIS CLIP PLACEMENT  03/13/2020   Procedure: HEMOSTASIS CLIP PLACEMENT;  Surgeon: Carol Ada, MD;  [facility];  Service: Endoscopy;;   HEMOSTASIS CLIP  PLACEMENT  12/18/2020   Procedure: HEMOSTASIS CLIP PLACEMENT;  Surgeon: Carol Ada, MD;  [facility];  Service: Endoscopy;;   HOT HEMOSTASIS N/A 03/13/2020   Procedure: HOT HEMOSTASIS (ARGON PLASMA COAGULATION/BICAP);  Surgeon: Carol Ada, MD;  [facility];  Service: Endoscopy;  Laterality: N/A;   HOT HEMOSTASIS N/A 12/18/2020   Procedure: HOT HEMOSTASIS (ARGON PLASMA COAGULATION/BICAP);  Surgeon: Carol Ada, MD;  [facility];  Service: Endoscopy;  Laterality: N/A;   IMPLANTABLE CARDIOVERTER DEFIBRILLATOR GENERATOR CHANGE  2008   INSERTION OF MESH N/A 07/09/2018   Procedure: INSERTION OF VICRYL MESH;  Surgeon: Judeth Horn, MD;  Location: [facility];  Service: General;  Laterality: N/A;   LAPAROTOMY N/A 09/18/2017   Procedure: EXPLORATORY LAPAROTOMY;  Surgeon: Judeth Horn, MD;  Location: [facility];  Service: General;  Laterality: N/A;   LEAD REVISION  06/22/2018   LEAD REVISION/REPAIR N/A 06/22/2018   Procedure: LEAD REVISION/REPAIR;  Surgeon: Deboraha Sprang, MD;  Location: [facility];  Service: Cardiovascular;  Laterality: N/A;   LYSIS OF ADHESION N/A 09/18/2017   Procedure: LYSIS OF ADHESION;  Surgeon: Judeth Horn, MD;  Location: [facility];  Service: General;  Laterality: N/A;   LYSIS OF ADHESION N/A 07/09/2018   Procedure: LYSIS OF ADHESION;  Surgeon: Judeth Horn, MD;  Location: [facility];  Service: General;  Laterality: N/A;   PARTIAL COLECTOMY N/A 09/18/2017   Procedure: ILEOCOLECTOMY;  Surgeon: Judeth Horn, MD;  Location: [facility];  Service: General;  Laterality: N/A;   PARTIAL COLECTOMY  09/25/2017   POLYPECTOMY  03/13/2020   Procedure: POLYPECTOMY;  Surgeon: Carol Ada, MD;  [facility];  Service: Endoscopy;;   RIGHT/LEFT HEART CATH AND CORONARY ANGIOGRAPHY N/A 06/01/2018   Procedure: RIGHT/LEFT HEART CATH AND CORONARY ANGIOGRAPHY;  Surgeon: Jettie Booze, MD;  Location: [facility];  Service: Cardiovascular;  Laterality: N/A;    SIGMOIDOSCOPY N/A 09/18/2017   Procedure: SIGMOIDOSCOPY;  Surgeon: Judeth Horn, MD;  Location: [facility];  Service: General;  Laterality: N/A;   SMALL INTESTINE SURGERY  07/09/2018   EXPLORATION OF  ABDOMINAL WOUND ERAS PATHWAYN/; MESH; LYSIS OF ADHESIONS   TOTAL ABDOMINAL HYSTERECTOMY  1990   TRANSFORAMINAL LUMBAR INTERBODY FUSION (TLIF) WITH PEDICLE SCREW FIXATION 2 LEVEL N/A 01/07/2020   Procedure: Lumbar Four-Five and Lumbar Five-Sacral One open lumbar decompression and transforaminal lumbar interbody fusion;  Surgeon: Judith Part, MD;  Location: [facility];  Service: Neurosurgery;  Laterality: N/A;  Lumbar Four-Five and Lumbar Five-Sacral One open lumbar decompression and transforaminal lumbar interbody fusion   TRIGGER FINGER RELEASE Right 05/213   TRIGGER FINGER RELEASE Right 04/08/2019   Procedure: RIGHT INDEX RELEASE TRIGGER FINGER/A-1 PULLEY;  Surgeon: Milly Jakob, MD;  Location: [facility];  Service: Orthopedics;  Laterality: Right;   TUBAL LIGATION  1980s   VESICOVAGINAL FISTULA CLOSURE W/ TAH     Past Surgical History:  Procedure Laterality Date   APPENDECTOMY  07/13/2017   BLADDER SUSPENSION  1990   "w/hysterectomy"   BOWEL RESECTION N/A 07/09/2018   Procedure: SMALL BOWEL RESECTION;  Surgeon: Judeth Horn, MD;  Location: [facility];  Service: General;  Laterality: N/A;   CARDIAC CATHETERIZATION  2005   no obstructive CAD per patient   CARDIAC CATHETERIZATION  2018   CARDIAC DEFIBRILLATOR PLACEMENT  2005   BiV ICD implanted,  LV lead is an epicardial lead   CARPAL TUNNEL RELEASE Left 07/25/2014   Dr.Williamson    CARPAL TUNNEL RELEASE Right 04/08/2019   Procedure: RIGHT CARPAL TUNNEL RELEASE ENDOSCOPIC;  Surgeon: Milly Jakob, MD;  Location: [facility];  Service: Orthopedics;  Laterality: Right;   CATARACT EXTRACTION W/ INTRAOCULAR LENS  IMPLANT, BILATERAL Bilateral    COLONIC STENT PLACEMENT N/A 08/31/2017   Procedure: COLONIC STENT PLACEMENT;  Surgeon: Carol Ada, MD;  Location:  [facility];  Service: Endoscopy;  Laterality: N/A;   COLONOSCOPY     2010-2011 Dr.Kipreos    COLOSTOMY  12/2016   Archie Endo 01/26/2017   COLOSTOMY REVERSAL N/A 07/13/2017  Procedure: COLOSTOMY REVERSAL;  Surgeon: Judeth Horn, MD;  Location: [facility];  Service: General;  Laterality: N/A;   CYST REMOVAL HAND Right 05/2013   thumb   DILATION AND CURETTAGE OF UTERUS  X 5-6   ESOPHAGOGASTRODUODENOSCOPY (EGD) WITH PROPOFOL N/A 03/13/2020   Procedure: ESOPHAGOGASTRODUODENOSCOPY (EGD) WITH PROPOFOL;  Surgeon: Carol Ada, MD;  [facility];  Service: Endoscopy;  Laterality: N/A;   EXCISION MASS ABDOMINAL N/A 07/09/2018   Procedure: EXPLORATION OF  ABDOMINAL WOUND ERAS PATHWAY;  Surgeon: Judeth Horn, MD;  Location: [facility];  Service: General;  Laterality: N/A;   FLEXIBLE SIGMOIDOSCOPY N/A 08/24/2017   Procedure: Beryle Quant;  Surgeon: Carol Ada, MD;  Location: [facility];  Service: Endoscopy;  Laterality: N/A;   FLEXIBLE SIGMOIDOSCOPY N/A 08/31/2017   Procedure: FLEXIBLE SIGMOIDOSCOPY;  Surgeon: Carol Ada, MD;  Location: [facility];  Service: Endoscopy;  Laterality: N/A;  stent placement   FLEXIBLE SIGMOIDOSCOPY N/A 03/13/2020   Procedure: FLEXIBLE SIGMOIDOSCOPY;  Surgeon: Carol Ada, MD;  [facility];  Service: Endoscopy;  Laterality: N/A;   FLEXIBLE SIGMOIDOSCOPY N/A 12/18/2020   Procedure: FLEXIBLE SIGMOIDOSCOPY;  Surgeon: Carol Ada, MD;  [facility];  Service: Endoscopy;  Laterality: N/A;   HEMOSTASIS CLIP PLACEMENT  03/13/2020   Procedure: HEMOSTASIS CLIP PLACEMENT;  Surgeon: Carol Ada, MD;  [facility];  Service: Endoscopy;;   HEMOSTASIS CLIP PLACEMENT  12/18/2020   Procedure: HEMOSTASIS CLIP PLACEMENT;  Surgeon: Carol Ada, MD;  [facility];  Service: Endoscopy;;   HOT HEMOSTASIS N/A 03/13/2020   Procedure: HOT HEMOSTASIS (ARGON PLASMA COAGULATION/BICAP);  Surgeon: Carol Ada, MD;  [facility];   Service: Endoscopy;  Laterality: N/A;   HOT HEMOSTASIS N/A 12/18/2020   Procedure: HOT HEMOSTASIS (ARGON PLASMA COAGULATION/BICAP);  Surgeon: Carol Ada, MD;  [facility];  Service: Endoscopy;  Laterality: N/A;   IMPLANTABLE CARDIOVERTER DEFIBRILLATOR GENERATOR CHANGE  2008   INSERTION OF MESH N/A 07/09/2018   Procedure: INSERTION OF VICRYL MESH;  Surgeon: Judeth Horn, MD;  Location: [facility];  Service: General;  Laterality: N/A;   LAPAROTOMY N/A 09/18/2017   Procedure: EXPLORATORY LAPAROTOMY;  Surgeon: Judeth Horn, MD;  Location: [facility];  Service: General;  Laterality: N/A;   LEAD REVISION  06/22/2018   LEAD REVISION/REPAIR N/A 06/22/2018   Procedure: LEAD REVISION/REPAIR;  Surgeon: Deboraha Sprang, MD;  Location: [facility];  Service: Cardiovascular;  Laterality: N/A;   LYSIS OF ADHESION N/A 09/18/2017   Procedure: LYSIS OF ADHESION;  Surgeon: Judeth Horn, MD;  Location: [facility];  Service: General;  Laterality: N/A;   LYSIS OF ADHESION N/A 07/09/2018   Procedure: LYSIS OF ADHESION;  Surgeon: Judeth Horn, MD;  Location: [facility];  Service: General;  Laterality: N/A;   PARTIAL COLECTOMY N/A 09/18/2017   Procedure: ILEOCOLECTOMY;  Surgeon: Judeth Horn, MD;  Location: [facility];  Service: General;  Laterality: N/A;   PARTIAL COLECTOMY  09/25/2017   POLYPECTOMY  03/13/2020   Procedure: POLYPECTOMY;  Surgeon: Carol Ada, MD;  [facility];  Service: Endoscopy;;   RIGHT/LEFT HEART CATH AND CORONARY ANGIOGRAPHY N/A 06/01/2018   Procedure: RIGHT/LEFT HEART CATH AND CORONARY ANGIOGRAPHY;  Surgeon: Jettie Booze, MD;  Location: [facility];  Service: Cardiovascular;  Laterality: N/A;   SIGMOIDOSCOPY N/A 09/18/2017   Procedure: SIGMOIDOSCOPY;  Surgeon: Judeth Horn, MD;  Location: [facility];  Service: General;  Laterality: N/A;   SMALL INTESTINE SURGERY  07/09/2018   EXPLORATION OF  ABDOMINAL WOUND ERAS PATHWAYN/; MESH;  LYSIS OF ADHESIONS   TOTAL ABDOMINAL HYSTERECTOMY   1990   TRANSFORAMINAL LUMBAR INTERBODY FUSION (TLIF) WITH PEDICLE SCREW FIXATION 2 LEVEL N/A 01/07/2020   Procedure: Lumbar Four-Five and Lumbar Five-Sacral One open lumbar decompression and transforaminal lumbar interbody fusion;  Surgeon: Judith Part, MD;  Location: [facility];  Service: Neurosurgery;  Laterality: N/A;  Lumbar Four-Five and Lumbar Five-Sacral One open lumbar decompression and transforaminal lumbar interbody fusion   TRIGGER FINGER RELEASE Right 05/213   TRIGGER FINGER RELEASE Right 04/08/2019   Procedure: RIGHT INDEX RELEASE TRIGGER FINGER/A-1 PULLEY;  Surgeon: Milly Jakob, MD;  Location: [facility];  Service: Orthopedics;  Laterality: Right;   TUBAL LIGATION  1980s   VESICOVAGINAL FISTULA CLOSURE W/ TAH     Past Medical History:  Diagnosis Date   AICD (automatic cardioverter/defibrillator) present    high RV threshold chronically, device was turned off in 2014; Device battery has been dead x 7 years; "turned it back on 06/22/2018"   Anemia, iron deficiency    Angioedema    felt to likely be due to ace inhibitors but says she has had this even off of medicines, appears to be tolerating ARBs chronically   Anxiety    Arthritis    "hands, legs, arms; bad in my back" (06/22/2018)   Asthmatic bronchitis with status asthmaticus    Bradycardia    CHF (congestive heart failure) (HCC)    Chronic lower back pain    Chronic pain    Chronic renal insufficiency    CKD (chronic kidney disease), stage III (HCC)    Coronary artery disease    Mild nonobstructive (30% LAD, 30% RCA) by 09/01/16 cath [facility]   Degenerative joint disease    Depression    Diabetes mellitus, type 2 ([place])    Diabetic peripheral neuropathy ([place])    Dizziness    Dyspnea on exertion    Family history of adverse reaction to anesthesia    Mother has nausea   GERD (gastroesophageal reflux disease)    Gout    Heart valve disorder    History of blood transfusion 1981; ~ 2005; 12/2016    "childbirth; defibrillator OR; colostomy OR"   History of mononucleosis 03/2014   Hyperlipidemia    Hypertension    Hypothyroid    Insomnia    Intervertebral disc degeneration    Ischemic cardiomyopathy    LBBB (left bundle branch block)    Myocardial infarction ([place]) dx'd ~ 2005   Nonischemic cardiomyopathy ([place])    s/p ICD in 2005. EF has since recovered   Obesity    On home oxygen therapy    "2L at night" (06/22/2018)   OSA on CPAP    Pneumonia    "couple times; last time was 12/2016" (06/22/2018)   PONV (postoperative nausea and vomiting)    Presence of permanent cardiac pacemaker    [employer]   Swelling    Syncope    Systemic hypertension    Vitamin D deficiency    BP (!) 90/54   Pulse 77   Ht 5' 4.5" (1.638 m)   Wt 244 lb (110.7 kg)   SpO2 94%   BMI 41.24 kg/m   Opioid Risk Score:   Fall Risk Score:  `1  Depression screen PHQ 2/9  Depression screen [facility] 2/9 08/25/2021 07/27/2021 06/29/2021 02/26/2021 09/17/2020 08/20/2020 05/04/2018  Decreased Interest 0 0 0 0 0 0 0  Down, Depressed, Hopeless 0 0 0 0 0 0 0  PHQ - 2  Score 0 0 0 0 0 0 0  Altered sleeping - - - - - 3 -  Tired, decreased energy - - - - - 3 -  Change in appetite - - - - - - -  Feeling bad or failure about yourself  - - - - - 0 -  Trouble concentrating - - - - - 0 -  Moving slowly or fidgety/restless - - - - - 0 -  Suicidal thoughts - - - - - 0 -  PHQ-9 Score - - - - - 6 -     Review of Systems  Musculoskeletal:  Positive for back pain.       Bilateral knee pain  All other systems reviewed and are negative.     Objective:   Physical Exam Vitals and nursing note reviewed.  Constitutional:      Appearance: Normal appearance.  Cardiovascular:     Rate and Rhythm: Normal rate and regular rhythm.     Pulses: Normal pulses.     Heart sounds: Normal heart sounds.  Pulmonary:     Effort: Pulmonary effort is normal.     Breath sounds: Normal breath sounds.  Musculoskeletal:     Cervical  back: Normal range of motion and neck supple.     Comments: Normal Muscle Bulk and Muscle Testing Reveals:  Upper Extremities: Full ROM and Muscle Strength 5/5  Lumbar Paraspinal Tenderness: L-4-L-5 Lower Extremities: Full ROM and Muscle Strength 5/5 Arises from Chair Slowly using walker for support Narrow Based  Gait     Skin:    General: Skin is warm and dry.  Neurological:     Mental Status: She is alert and oriented to person, place, and time.  Psychiatric:        Mood and Affect: Mood normal.        Behavior: Behavior normal.         Assessment & Plan:  Chronic Low Back Pain: Encouraged to increase HEP as Tolerated. Continue to Monitor.  08/25/2021 Bilateral Primary Osteoarthritis: S/P Right knee Genicular nerve radiofrequency neurotomy x 3, Upper medial, Upper lateral , and Lower Medial under fluoroscopic guidance on 02/26/2021, with good relief noted. Continue monitor. 08/25/2021 3. Myofascial Pain: S/P Trigger Point Injection with Good Relief Noted. 08/25/2021 4. Chronic Pain Syndrome: Refilled: Hydrocodone 10/325 mg one tablet twice a day as needed for pain #60. We will continue the opioid monitoring program, this consists of regular clinic visits, examinations, urine drug screen, pill counts as well as use of [place] Controlled Substance Reporting system. A 12 month History has been reviewed on the [place] Controlled Substance Reporting System on 08/25/2021.   F/U in 1 month

## 2021-09-02 DIAGNOSIS — Z20822 Contact with and (suspected) exposure to covid-19: Secondary | ICD-10-CM | POA: Diagnosis not present

## 2021-09-17 DIAGNOSIS — L600 Ingrowing nail: Secondary | ICD-10-CM | POA: Diagnosis not present

## 2021-09-17 DIAGNOSIS — M79671 Pain in right foot: Secondary | ICD-10-CM | POA: Diagnosis not present

## 2021-09-17 DIAGNOSIS — M79672 Pain in left foot: Secondary | ICD-10-CM | POA: Diagnosis not present

## 2021-09-17 DIAGNOSIS — M7751 Other enthesopathy of right foot: Secondary | ICD-10-CM | POA: Diagnosis not present

## 2021-09-17 DIAGNOSIS — M7752 Other enthesopathy of left foot: Secondary | ICD-10-CM | POA: Diagnosis not present

## 2021-09-20 ENCOUNTER — Other Ambulatory Visit: Payer: Self-pay | Admitting: Interventional Cardiology

## 2021-09-20 DIAGNOSIS — N184 Chronic kidney disease, stage 4 (severe): Secondary | ICD-10-CM | POA: Diagnosis not present

## 2021-09-22 ENCOUNTER — Encounter: Payer: Self-pay | Admitting: Registered Nurse

## 2021-09-22 ENCOUNTER — Encounter: Payer: Medicare Other | Attending: Registered Nurse | Admitting: Registered Nurse

## 2021-09-22 ENCOUNTER — Other Ambulatory Visit: Payer: Self-pay

## 2021-09-22 VITALS — BP 122/70 | HR 76 | Ht 64.5 in | Wt 246.0 lb

## 2021-09-22 DIAGNOSIS — Z5181 Encounter for therapeutic drug level monitoring: Secondary | ICD-10-CM | POA: Insufficient documentation

## 2021-09-22 DIAGNOSIS — G894 Chronic pain syndrome: Secondary | ICD-10-CM | POA: Diagnosis not present

## 2021-09-22 DIAGNOSIS — M545 Low back pain, unspecified: Secondary | ICD-10-CM | POA: Diagnosis not present

## 2021-09-22 DIAGNOSIS — G8929 Other chronic pain: Secondary | ICD-10-CM | POA: Insufficient documentation

## 2021-09-22 DIAGNOSIS — M2550 Pain in unspecified joint: Secondary | ICD-10-CM | POA: Insufficient documentation

## 2021-09-22 DIAGNOSIS — Z79891 Long term (current) use of opiate analgesic: Secondary | ICD-10-CM | POA: Diagnosis not present

## 2021-09-22 DIAGNOSIS — M170 Bilateral primary osteoarthritis of knee: Secondary | ICD-10-CM | POA: Diagnosis not present

## 2021-09-22 MED ORDER — HYDROCODONE-ACETAMINOPHEN 10-325 MG PO TABS: 1.0000 | ORAL_TABLET | Freq: Two times a day (BID) | ORAL | 0 refills | Status: DC | PRN

## 2021-09-22 NOTE — Progress Notes (Signed)
Subjective:    Patient ID: Crystal [name], female    DOB: 1951-05-22, 71 y.o.   MRN: [id number]  HPI: Crystal [name] is a 71 y.o. female who returns for follow up appointment for chronic pain and medication refill. She states her  pain is located in her bilateral knee R>L, also reports her bilateral knee pain has increased in intensity . Ms. Crystal [name] asked if she can receive her right knee genicular nerve radiofrequency neurotomy, she will be scheduled with Dr Letta Pate, she verbalizes understanding. Marland Kitchen Her last Right Knee Genicular Nerve Radiofrequency Neurotomy was on 02/26/2021, with good relief noted.   Indication Chronic post operative pain in the Knee, pain postop total knee replacement which has not responded to conservative management such as physical therapy and medication management  She  rates her pain 9. Her current exercise regime is walking and performing stretching exercises.  Ms. Crystal [name] equivalent is 20.00 MME.  Last UDS was Performed on 06/29/2021, it was consistent.     Pain Inventory Average Pain 10 Pain Right Now 9 My pain is sharp, burning, stabbing, and aching  In the last 24 hours, has pain interfered with the following? General activity 7 Relation with others 5 Enjoyment of life 7 What TIME of day is your pain at its worst? morning  and evening Sleep (in general) Fair  Pain is worse with: walking, bending, standing, and some activites Pain improves with: rest, medication, and injections Relief from Meds: 7  Family History  Problem Relation Age of Onset   Hypertension Father    Heart disease Father    Cancer Father        prostate   Heart disease Other        (Maternal side) Ischemic heart disease   Diabetes Mellitus II Other    Arrhythmia Mother    Diabetes Mellitus II Mother        Borderline DM   Hypertension Mother    Asthma Mother    Cancer Brother    Dementia Brother    Hypertension Brother    Hypertension Daughter    Hypertension  Daughter    Diabetes Daughter    Hypertension Daughter    Atrial fibrillation Daughter    GER disease Daughter    Hypertension Son    Anxiety disorder Son    Hypothyroidism Son    Breast cancer Maternal Grandmother        in her 55's   Social History   Socioeconomic History   Marital status: Widowed    Spouse name: Not on file   Number of children: Not on file   Years of education: Not on file   Highest education level: Not on file  Occupational History    Comment: retired LPN  Tobacco Use   Smoking status: Never   Smokeless tobacco: Never  Vaping Use   Vaping Use: Never used  Substance and Sexual Activity   Alcohol use: Not Currently   Drug use: Never   Sexual activity: Not Currently  Other Topics Concern   Not on file  Social History Narrative   Pt lives in [place] alone.  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      As of 03/15/17:   Diet: 1800 Calorie      Caffeine: Yes      Married, if yes what year: Widowed, married 1973      Do you live in a house,  apartment, assisted living, condo, trailer, ect: House, 1 stories, and 1 person      Pets: No      Current/Past profession: LPN, retired       Exercise: Yes, walking          Living Will: Yes   DNR: No   POA/HPOA: Yes      Functional Status:   Do you have difficulty bathing or dressing yourself? No   Do you have difficulty preparing food or eating? No   Do you have difficulty managing your medications? No   Do you have difficulty managing your finances? No   Do you have difficulty affording your medications? Yes   Social Determinants of Health   Financial Resource Strain: Not on file  Food Insecurity: Not on file  Transportation Needs: Not on file  Physical Activity: Not on file  Stress: Not on file  Social Connections: Not on file   Past Surgical History:  Procedure Laterality Date   APPENDECTOMY  07/13/2017   [name]    "w/hysterectomy"   BOWEL RESECTION N/A 07/09/2018   Procedure: SMALL BOWEL RESECTION;  Surgeon: Judeth Horn, MD;  Location: [place];  Service: General;  Laterality: N/A;   CARDIAC CATHETERIZATION  2005   no obstructive CAD per patient   CARDIAC CATHETERIZATION  2018   CARDIAC DEFIBRILLATOR PLACEMENT  2005   BiV ICD implanted,  LV lead is an epicardial lead   CARPAL TUNNEL RELEASE Left 07/25/2014   Dr.Williamson    CARPAL TUNNEL RELEASE Right 04/08/2019   Procedure: RIGHT CARPAL TUNNEL RELEASE ENDOSCOPIC;  Surgeon: Milly Jakob, MD;  Location: [place];  Service: Orthopedics;  Laterality: Right;   CATARACT EXTRACTION W/ INTRAOCULAR LENS  IMPLANT, BILATERAL Bilateral    COLONIC STENT PLACEMENT N/A 08/31/2017   Procedure: COLONIC STENT PLACEMENT;  Surgeon: Carol Ada, MD;  Location: [place];  Service: Endoscopy;  Laterality: N/A;   COLONOSCOPY     2010-2011 Dr.Kipreos    COLOSTOMY  12/2016   Archie Endo 01/26/2017   COLOSTOMY REVERSAL N/A 07/13/2017   Procedure: COLOSTOMY REVERSAL;  Surgeon: Judeth Horn, MD;  Location: [place];  Service: General;  Laterality: N/A;   CYST REMOVAL HAND Right 05/2013   thumb   DILATION AND CURETTAGE OF UTERUS  X 5-6   ESOPHAGOGASTRODUODENOSCOPY (EGD) WITH PROPOFOL N/A 03/13/2020   Procedure: ESOPHAGOGASTRODUODENOSCOPY (EGD) WITH PROPOFOL;  Surgeon: Carol Ada, MD;  [facility];  Service: Endoscopy;  Laterality: N/A;   EXCISION MASS ABDOMINAL N/A 07/09/2018   Procedure: EXPLORATION OF  ABDOMINAL WOUND ERAS PATHWAY;  Surgeon: Judeth Horn, MD;  Location: [place];  Service: General;  Laterality: N/A;   FLEXIBLE SIGMOIDOSCOPY N/A 08/24/2017   Procedure: Beryle Quant;  Surgeon: Carol Ada, MD;  Location: [place];  Service: Endoscopy;  Laterality: N/A;   FLEXIBLE SIGMOIDOSCOPY N/A 08/31/2017   Procedure: FLEXIBLE SIGMOIDOSCOPY;  Surgeon: Carol Ada, MD;  Location: [place];  Service: Endoscopy;  Laterality: N/A;  stent placement    FLEXIBLE SIGMOIDOSCOPY N/A 03/13/2020   Procedure: FLEXIBLE SIGMOIDOSCOPY;  Surgeon: Carol Ada, MD;  [facility];  Service: Endoscopy;  Laterality: N/A;   FLEXIBLE SIGMOIDOSCOPY N/A 12/18/2020   Procedure: FLEXIBLE SIGMOIDOSCOPY;  Surgeon: Carol Ada, MD;  [facility];  Service: Endoscopy;  Laterality: N/A;   HEMOSTASIS CLIP PLACEMENT  03/13/2020   Procedure: HEMOSTASIS CLIP PLACEMENT;  Surgeon: Carol Ada, MD;  [facility];  Service: Endoscopy;;   HEMOSTASIS CLIP PLACEMENT  12/18/2020   Procedure: HEMOSTASIS  CLIP PLACEMENT;  Surgeon: Carol Ada, MD;  [facility];  Service: Endoscopy;;   HOT HEMOSTASIS N/A 03/13/2020   Procedure: HOT HEMOSTASIS (ARGON PLASMA COAGULATION/BICAP);  Surgeon: Carol Ada, MD;  [facility];  Service: Endoscopy;  Laterality: N/A;   HOT HEMOSTASIS N/A 12/18/2020   Procedure: HOT HEMOSTASIS (ARGON PLASMA COAGULATION/BICAP);  Surgeon: Carol Ada, MD;  Location: [facility];  Service: Endoscopy;  Laterality: N/A;   IMPLANTABLE CARDIOVERTER DEFIBRILLATOR GENERATOR CHANGE  2008   INSERTION OF MESH N/A 07/09/2018   Procedure: INSERTION OF VICRYL MESH;  Surgeon: Judeth Horn, MD;  Location: [place];  Service: General;  Laterality: N/A;   LAPAROTOMY N/A 09/18/2017   Procedure: EXPLORATORY LAPAROTOMY;  Surgeon: Judeth Horn, MD;  Location: [place];  Service: General;  Laterality: N/A;   LEAD REVISION  06/22/2018   LEAD REVISION/REPAIR N/A 06/22/2018   Procedure: LEAD REVISION/REPAIR;  Surgeon: Deboraha Sprang, MD;  Location: [place] [facility];  Service: Cardiovascular;  Laterality: N/A;   LYSIS OF ADHESION N/A 09/18/2017   Procedure: LYSIS OF ADHESION;  Surgeon: Judeth Horn, MD;  Location: [place];  Service: General;  Laterality: N/A;   LYSIS OF ADHESION N/A 07/09/2018   Procedure: LYSIS OF ADHESION;  Surgeon: Judeth Horn, MD;  Location: [place];  Service: General;  Laterality: N/A;   PARTIAL COLECTOMY N/A 09/18/2017    Procedure: ILEOCOLECTOMY;  Surgeon: Judeth Horn, MD;  Location: [place];  Service: General;  Laterality: N/A;   PARTIAL COLECTOMY  09/25/2017   POLYPECTOMY  03/13/2020   Procedure: POLYPECTOMY;  Surgeon: Carol Ada, MD;  [facility];  Service: Endoscopy;;   RIGHT/LEFT HEART CATH AND CORONARY ANGIOGRAPHY N/A 06/01/2018   Procedure: RIGHT/LEFT HEART CATH AND CORONARY ANGIOGRAPHY;  Surgeon: Jettie Booze, MD;  Location: [place] [facility];  Service: Cardiovascular;  Laterality: N/A;   SIGMOIDOSCOPY N/A 09/18/2017   Procedure: SIGMOIDOSCOPY;  Surgeon: Judeth Horn, MD;  Location: [place];  Service: General;  Laterality: N/A;   SMALL INTESTINE SURGERY  07/09/2018   EXPLORATION OF  ABDOMINAL WOUND ERAS PATHWAYN/; MESH; LYSIS OF ADHESIONS   TOTAL ABDOMINAL HYSTERECTOMY  1990   TRANSFORAMINAL LUMBAR INTERBODY FUSION (TLIF) WITH PEDICLE SCREW FIXATION 2 LEVEL N/A 01/07/2020   Procedure: Lumbar Four-Five and Lumbar Five-Sacral One open lumbar decompression and transforaminal lumbar interbody fusion;  Surgeon: Judith Part, MD;  Location: [place];  Service: Neurosurgery;  Laterality: N/A;  Lumbar Four-Five and Lumbar Five-Sacral One open lumbar decompression and transforaminal lumbar interbody fusion   TRIGGER FINGER RELEASE Right 05/213   TRIGGER FINGER RELEASE Right 04/08/2019   Procedure: RIGHT INDEX RELEASE TRIGGER FINGER/A-1 PULLEY;  Surgeon: Milly Jakob, MD;  Location: [place];  Service: Orthopedics;  Laterality: Right;   TUBAL LIGATION  1980s   VESICOVAGINAL FISTULA CLOSURE W/ TAH     Past Surgical History:  Procedure Laterality Date   APPENDECTOMY  07/13/2017   BLADDER SUSPENSION  1990   "w/hysterectomy"   BOWEL RESECTION N/A 07/09/2018   Procedure: SMALL BOWEL RESECTION;  Surgeon: Judeth Horn, MD;  Location: [place];  Service: General;  Laterality: N/A;   CARDIAC CATHETERIZATION  2005   no obstructive CAD per patient   CARDIAC CATHETERIZATION  2018   CARDIAC  DEFIBRILLATOR PLACEMENT  2005   BiV ICD implanted,  LV lead is an epicardial lead   CARPAL TUNNEL RELEASE Left 07/25/2014   Dr.Williamson    CARPAL TUNNEL RELEASE Right 04/08/2019   Procedure: RIGHT CARPAL TUNNEL RELEASE ENDOSCOPIC;  Surgeon:  Milly Jakob, MD;  Location: [place];  Service: Orthopedics;  Laterality: Right;   CATARACT EXTRACTION W/ INTRAOCULAR LENS  IMPLANT, BILATERAL Bilateral    COLONIC STENT PLACEMENT N/A 08/31/2017   Procedure: COLONIC STENT PLACEMENT;  Surgeon: Carol Ada, MD;  Location: [place];  Service: Endoscopy;  Laterality: N/A;   COLONOSCOPY     2010-2011 Dr.Kipreos    COLOSTOMY  12/2016   Archie Endo 01/26/2017   COLOSTOMY REVERSAL N/A 07/13/2017   Procedure: COLOSTOMY REVERSAL;  Surgeon: Judeth Horn, MD;  Location: [place];  Service: General;  Laterality: N/A;   CYST REMOVAL HAND Right 05/2013   thumb   DILATION AND CURETTAGE OF UTERUS  X 5-6   ESOPHAGOGASTRODUODENOSCOPY (EGD) WITH PROPOFOL N/A 03/13/2020   Procedure: ESOPHAGOGASTRODUODENOSCOPY (EGD) WITH PROPOFOL;  Surgeon: Carol Ada, MD;  [facility];  Service: Endoscopy;  Laterality: N/A;   EXCISION MASS ABDOMINAL N/A 07/09/2018   Procedure: EXPLORATION OF  ABDOMINAL WOUND ERAS PATHWAY;  Surgeon: Judeth Horn, MD;  Location: [place];  Service: General;  Laterality: N/A;   FLEXIBLE SIGMOIDOSCOPY N/A 08/24/2017   Procedure: Beryle Quant;  Surgeon: Carol Ada, MD;  Location: [place];  Service: Endoscopy;  Laterality: N/A;   FLEXIBLE SIGMOIDOSCOPY N/A 08/31/2017   Procedure: FLEXIBLE SIGMOIDOSCOPY;  Surgeon: Carol Ada, MD;  Location: [place];  Service: Endoscopy;  Laterality: N/A;  stent placement   FLEXIBLE SIGMOIDOSCOPY N/A 03/13/2020   Procedure: FLEXIBLE SIGMOIDOSCOPY;  Surgeon: Carol Ada, MD;  [facility];  Service: Endoscopy;  Laterality: N/A;   FLEXIBLE SIGMOIDOSCOPY N/A 12/18/2020   Procedure: FLEXIBLE SIGMOIDOSCOPY;  Surgeon: Carol Ada, MD;  [facility];  Service: Endoscopy;  Laterality: N/A;   HEMOSTASIS CLIP PLACEMENT  03/13/2020   Procedure: HEMOSTASIS CLIP PLACEMENT;  Surgeon: Carol Ada, MD;  [facility];  Service: Endoscopy;;   HEMOSTASIS CLIP PLACEMENT  12/18/2020   Procedure: HEMOSTASIS CLIP PLACEMENT;  Surgeon: Carol Ada, MD;  [facility];  Service: Endoscopy;;   HOT HEMOSTASIS N/A 03/13/2020   Procedure: HOT HEMOSTASIS (ARGON PLASMA COAGULATION/BICAP);  Surgeon: Carol Ada, MD;  Location[facility];  Service: Endoscopy;  Laterality: N/A;   HOT HEMOSTASIS N/A 12/18/2020   Procedure: HOT HEMOSTASIS (ARGON PLASMA COAGULATION/BICAP);  Surgeon: Carol Ada, MD;  Location[facility];  Service: Endoscopy;  Laterality: N/A;   IMPLANTABLE CARDIOVERTER DEFIBRILLATOR GENERATOR CHANGE  2008   INSERTION OF MESH N/A 07/09/2018   Procedure: INSERTION OF VICRYL MESH;  Surgeon: Judeth Horn, MD;  Location: [place];  Service: General;  Laterality: N/A;   LAPAROTOMY N/A 09/18/2017   Procedure: EXPLORATORY LAPAROTOMY;  Surgeon: Judeth Horn, MD;  Location: [place];  Service: General;  Laterality: N/A;   LEAD REVISION  06/22/2018   LEAD REVISION/REPAIR N/A 06/22/2018   Procedure: LEAD REVISION/REPAIR;  Surgeon: Deboraha Sprang, MD;  Location: [place] [facility];  Service: Cardiovascular;  Laterality: N/A;   LYSIS OF ADHESION N/A 09/18/2017   Procedure: LYSIS OF ADHESION;  Surgeon: Judeth Horn, MD;  Location: [place];  Service: General;  Laterality: N/A;   LYSIS OF ADHESION N/A 07/09/2018   Procedure: LYSIS OF ADHESION;  Surgeon: Judeth Horn, MD;  Location: [place];  Service: General;  Laterality: N/A;   PARTIAL COLECTOMY N/A 09/18/2017   Procedure: ILEOCOLECTOMY;  Surgeon: Judeth Horn, MD;  Location: [place];  Service: General;  Laterality: N/A;   PARTIAL COLECTOMY  09/25/2017   POLYPECTOMY  03/13/2020   Procedure: POLYPECTOMY;  Surgeon: Carol Ada, MD;  [facility];  Service: Endoscopy;;    RIGHT/LEFT HEART CATH AND CORONARY ANGIOGRAPHY N/A 06/01/2018   Procedure: RIGHT/LEFT HEART CATH AND CORONARY ANGIOGRAPHY;  Surgeon: Jettie Booze, MD;  Location: [place] [facility];  Service: Cardiovascular;  Laterality: N/A;   SIGMOIDOSCOPY N/A 09/18/2017   Procedure: SIGMOIDOSCOPY;  Surgeon: Judeth Horn, MD;  Location: [place];  Service: General;  Laterality: N/A;   SMALL INTESTINE SURGERY  07/09/2018   EXPLORATION OF  ABDOMINAL WOUND ERAS PATHWAYN/; MESH; LYSIS OF ADHESIONS   TOTAL ABDOMINAL HYSTERECTOMY  1990   TRANSFORAMINAL LUMBAR INTERBODY FUSION (TLIF) WITH PEDICLE SCREW FIXATION 2 LEVEL N/A 01/07/2020   Procedure: Lumbar Four-Five and Lumbar Five-Sacral One open lumbar decompression and transforaminal lumbar interbody fusion;  Surgeon: Judith Part, MD;  Location: [place];  Service: Neurosurgery;  Laterality: N/A;  Lumbar Four-Five and Lumbar Five-Sacral One open lumbar decompression and transforaminal lumbar interbody fusion   TRIGGER FINGER RELEASE Right 05/213   TRIGGER FINGER RELEASE Right 04/08/2019   Procedure: RIGHT INDEX RELEASE TRIGGER FINGER/A-1 PULLEY;  Surgeon: Milly Jakob, MD;  Location: [place];  Service: Orthopedics;  Laterality: Right;   TUBAL LIGATION  1980s   VESICOVAGINAL FISTULA CLOSURE W/ TAH     Past Medical History:  Diagnosis Date   AICD (automatic cardioverter/defibrillator) present    high RV threshold chronically, device was turned off in 2014; Device battery has been dead x 7 years; "turned it back on 06/22/2018"   Anemia, iron deficiency    Angioedema    felt to likely be due to ace inhibitors but says she has had this even off of medicines, appears to be tolerating ARBs chronically   Anxiety    Arthritis    "hands, legs, arms; bad in my back" (06/22/2018)   Asthmatic bronchitis with status asthmaticus    Bradycardia    CHF (congestive heart failure) (HCC)    Chronic lower back pain    Chronic pain    Chronic renal insufficiency    CKD  (chronic kidney disease), stage III (HCC)    Coronary artery disease    Mild nonobstructive (30% LAD, 30% RCA) by 09/01/16 cath at [facility]   Degenerative joint disease    Depression    Diabetes mellitus, type 2 ([place])    Diabetic peripheral neuropathy ([place])    Dizziness    Dyspnea on exertion    Family history of adverse reaction to anesthesia    Mother has nausea   GERD (gastroesophageal reflux disease)    Gout    Heart valve disorder    History of blood transfusion 1981; ~ 2005; 12/2016   "childbirth; defibrillator OR; colostomy OR"   History of mononucleosis 03/2014   Hyperlipidemia    Hypertension    Hypothyroid    Insomnia    Intervertebral disc degeneration    Ischemic cardiomyopathy    LBBB (left bundle branch block)    Myocardial infarction ([place]) dx'd ~ 2005   Nonischemic cardiomyopathy ([place])    s/p ICD in 2005. EF has since recovered   Obesity    On home oxygen therapy    "2L at night" (06/22/2018)   OSA on CPAP    Pneumonia    "couple times; last time was 12/2016" (06/22/2018)   PONV (postoperative nausea and vomiting)    Presence of permanent cardiac pacemaker    [employer]   Swelling    Syncope    Systemic hypertension    Vitamin D deficiency    BP 122/70  Pulse 76    Ht 5' 4.5" (1.638 m)    Wt 246 lb (111.6 kg)    SpO2 92%    BMI 41.57 kg/m   Opioid Risk Score:   Fall Risk Score:  `1  Depression screen PHQ 2/9  Depression screen [facility] 2/9 08/25/2021 07/27/2021 06/29/2021 02/26/2021 09/17/2020 08/20/2020 05/04/2018  Decreased Interest 0 0 0 0 0 0 0  Down, Depressed, Hopeless 0 0 0 0 0 0 0  PHQ - 2 Score 0 0 0 0 0 0 0  Altered sleeping - - - - - 3 -  Tired, decreased energy - - - - - 3 -  Change in appetite - - - - - - -  Feeling bad or failure about yourself  - - - - - 0 -  Trouble concentrating - - - - - 0 -  Moving slowly or fidgety/restless - - - - - 0 -  Suicidal thoughts - - - - - 0 -  PHQ-9 Score - - - - - 6 -     Review of Systems   Musculoskeletal:  Positive for back pain.       Bilateral knee pain  All other systems reviewed and are negative.     Objective:   Physical Exam Vitals and nursing note reviewed.  Constitutional:      Appearance: Normal appearance.  Cardiovascular:     Rate and Rhythm: Normal rate and regular rhythm.     Pulses: Normal pulses.     Heart sounds: Normal heart sounds.  Pulmonary:     Effort: Pulmonary effort is normal.     Breath sounds: Normal breath sounds.  Musculoskeletal:     Cervical back: Normal range of motion and neck supple.     Comments: Normal Muscle Bulk and Muscle Testing Reveals:  Upper Extremities: Full ROM and Muscle Strength 5/5  Lumbar Paraspinal Tenderness: L-4-L-5 Mainly Left Side  Lower Extremities: Full ROM and Muscle Strength 5/5 Right Lower Extremity Flexion Produces Pain into her Right Patella Arises from Chair slowly using cane for support Antalgic  Gait     Skin:    General: Skin is warm and dry.  Neurological:     Mental Status: She is alert and oriented to person, place, and time.  Psychiatric:        Mood and Affect: Mood normal.        Behavior: Behavior normal.         Assessment & Plan:  Chronic Low Back Pain: Encouraged to increase HEP as Tolerated. Continue to Monitor.  09/22/2021 Bilateral Primary Osteoarthritis:  Scheduled for Right Knee Genicular nerve radiofrequency neurotomy, with Dr Letta Pate.  Indication Chronic post operative pain in the Knee, pain postop total knee replacement which has not responded to conservative management such as physical therapy and medication management; S/P Right knee Genicular nerve radiofrequency neurotomy x 3, Upper medial, Upper lateral , and Lower Medial under fluoroscopic guidance on 02/26/2021, with good relief noted. Continue monitor. 09/22/2021 3. Myofascial Pain: S/P Trigger Point Injection on 06/29/2021 with Good Relief Noted. 09/22/2021 4. Chronic Pain Syndrome: Refilled: Hydrocodone 10/325  mg one tablet twice a day as needed for pain #60. We will continue the opioid monitoring program, this consists of regular clinic visits, examinations, urine drug screen, pill counts as well as use of [place] Controlled Substance Reporting system. A 12 month History has been reviewed on the [place] Controlled Substance Reporting System on 09/22/2021.   F/U in 1  month

## 2021-09-24 ENCOUNTER — Ambulatory Visit (INDEPENDENT_AMBULATORY_CARE_PROVIDER_SITE_OTHER): Payer: Medicare Other

## 2021-09-24 DIAGNOSIS — I428 Other cardiomyopathies: Secondary | ICD-10-CM

## 2021-09-24 LAB — CUP PACEART REMOTE DEVICE CHECK
Battery Remaining Longevity: 72 mo
Battery Remaining Percentage: 81 %
Brady Statistic RA Percent Paced: 0 %
Brady Statistic RV Percent Paced: 98 %
Date Time Interrogation Session: 20230303021400
HighPow Impedance: 73 Ohm
Implantable Lead Implant Date: 20191129
Implantable Lead Implant Date: 20191129
Implantable Lead Implant Date: 20191129
Implantable Lead Location: 753858
Implantable Lead Location: 753859
Implantable Lead Location: 753860
Implantable Lead Model: 293
Implantable Lead Model: 4086
Implantable Lead Serial Number: 226691
Implantable Lead Serial Number: 440868
Implantable Pulse Generator Implant Date: 20191129
Lead Channel Impedance Value: 465 Ohm
Lead Channel Impedance Value: 576 Ohm
Lead Channel Impedance Value: 833 Ohm
Lead Channel Setting Pacing Amplitude: 2 V
Lead Channel Setting Pacing Amplitude: 2.5 V
Lead Channel Setting Pacing Amplitude: 3 V
Lead Channel Setting Pacing Pulse Width: 0.4 ms
Lead Channel Setting Pacing Pulse Width: 1 ms
Lead Channel Setting Sensing Sensitivity: 0.5 mV
Lead Channel Setting Sensing Sensitivity: 1 mV
Pulse Gen Serial Number: 227627

## 2021-09-28 ENCOUNTER — Encounter: Payer: Self-pay | Admitting: Physical Medicine & Rehabilitation

## 2021-09-28 ENCOUNTER — Other Ambulatory Visit: Payer: Self-pay

## 2021-09-28 ENCOUNTER — Encounter (HOSPITAL_BASED_OUTPATIENT_CLINIC_OR_DEPARTMENT_OTHER): Payer: Medicare Other | Admitting: Physical Medicine & Rehabilitation

## 2021-09-28 VITALS — BP 103/60 | HR 75 | Temp 97.9°F | Ht 64.5 in | Wt 245.0 lb

## 2021-09-28 DIAGNOSIS — G8929 Other chronic pain: Secondary | ICD-10-CM | POA: Diagnosis not present

## 2021-09-28 DIAGNOSIS — G894 Chronic pain syndrome: Secondary | ICD-10-CM | POA: Diagnosis not present

## 2021-09-28 DIAGNOSIS — Z79891 Long term (current) use of opiate analgesic: Secondary | ICD-10-CM | POA: Diagnosis not present

## 2021-09-28 DIAGNOSIS — M170 Bilateral primary osteoarthritis of knee: Secondary | ICD-10-CM | POA: Diagnosis not present

## 2021-09-28 DIAGNOSIS — M545 Low back pain, unspecified: Secondary | ICD-10-CM | POA: Diagnosis not present

## 2021-09-28 DIAGNOSIS — Z5181 Encounter for therapeutic drug level monitoring: Secondary | ICD-10-CM | POA: Diagnosis not present

## 2021-09-28 NOTE — Progress Notes (Signed)
?  PROCEDURE RECORD ?[facility] ? ? ?Name: Crystal [name] ?DOB:Jul 23, 1951 ?MRN: [id number] ? ?Date:09/28/2021  Physician: Alysia Penna, MD   ? ?Nurse/CMA: Truman Hayward, CMA ? ?Allergies:  ?Allergies  ?Allergen Reactions  ? Ace Inhibitors Swelling and Other (See Comments)  ?  Angioedema ?Other reaction(s): swelling ?Other reaction(s): swelling ?Other reaction(s): swelling  ? Glucophage [Metformin Hcl] Other (See Comments)  ?  Renal failure  ? Telmisartan Other (See Comments)  ?  AVOID ARB/ ACEi in this patient due to recurrent AKI  ? Advicor [Niacin-Lovastatin Er] Other (See Comments)  ?  Muscle aches  ? Bystolic [Nebivolol Hcl] Swelling  ?  Whole body swelled  ? Erythromycin Diarrhea, Nausea And Vomiting and Other (See Comments)  ?  *DERIVATIVES* ?Other reaction(s): severe vomiting ?Other reaction(s): severe vomiting ?Other reaction(s): severe vomiting  ? Lipitor [Atorvastatin] Other (See Comments)  ?  Muscle aches  ? Lopid [Gemfibrozil] Other (See Comments)  ?  Muscle aches  ? Statins Other (See Comments)  ?  Muscle aches tolerates crestor  ? Hydromorphone Other (See Comments)  ?  Confusion ?Other reaction(s): confusion.can take lower level narcs but those cause a lot of itching  ? Losartan   ?  Other reaction(s): stopped 12/2018. ? due to worse gfr ?Other reaction(s): stopped 12/2018. ? due to worse gfr ?Other reaction(s): stopped 12/2018. ? due to worse gfr  ? Nebivolol Hcl   ?  Other reaction(s): swelling ?Other reaction(s): swelling ?Other reaction(s): swelling  ? Other   ?  Other reaction(s): muscle aches  ? [place]   ?  Other reaction(s): itching ?Other reaction(s): itching ?Other reaction(s): itching  ? Spironolactone   ?  Other reaction(s): arf ?Other reaction(s): arf ?Other reaction(s): arf  ? ? ?Consent Signed: Yes.    Is patient diabetic? Yes.    CBG today? 165 ? ?Pregnant: No. LMP: No LMP recorded. Patient has had a hysterectomy. (age [age]) ? ?Anticoagulants:  no ?Anti-inflammatory: no ?Antibiotics: no ? ?Procedure: Right Genicular Radiofrequency  Position: Supine ?Start Time: 10:46 am  End Time: 11:15  Fluoro Time: 52 ? ?RN/CMA Hallis Meditz,CMA Cashmere Dingley,CMA    ?Time 10:25 am 11:18 am    ?BP 103/80 120/64    ?Pulse 75 73    ?Respirations 16 16    ?O2 Sat 93 91    ?S/S 6 6    ?Pain Level 6/10 3/10    ? ?D/C home with no one, patient A & O X 3, D/C instructions reviewed, and sits independently. ? ? ? ? ? ? ? ?

## 2021-09-28 NOTE — Progress Notes (Signed)
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 Patient placed supine on the fluoroscopy table AP images of the knee joint were obtained. A 25-gauge 1.5 inch needle was used to anesthetize the skin and subcutaneous tissue with 1% lidocaine, 1 cc at each of 3 locations. Then a 18-gauge 100 cm radiofrequency needle with 1 cm curved tip was inserted targeting the junction of the medial flare of the tibia with the shaft of the tibia, bone contact made and confirmed with lateral imaging.  Electrical stimulation up to 2 mV without abnormal twitch followed by injection of 1.52m .5% bupivacaine.  Radiofrequency lesioning at 80 C x 80 seconds was performed.  Then the junction of the medial epicondyles of the femur with the femoral shaft was targeted needle was advanced under fluoroscopic guidance until bone contact. Appropriate depth was obtained and confirmed with lateral images.  Motor stimulation up to 2 mV demonstrated no abnormal twitch followed by injection of of 1.571mof .5% bupivacaine.  Radiofrequency lesioning 80 C x 80 seconds was performed.  Then the junction of the lateral femoral condyle with the femoral shaft was targeted. 22-gauge 3.5 inch needle was advanced under fluoroscopic guidance until bone contact. Appropriate depth was confirmed with lateral imaging. Motor stimulation up to 2 mV demonstrated no abnormal twitch followed by injection of of 1.62m90mf .5% bupivacaine.  Radiofrequency lesioning at 80 C x 80 seconds was performed. Patient  tolerated procedure well. Post procedure instructions given

## 2021-09-30 DIAGNOSIS — L600 Ingrowing nail: Secondary | ICD-10-CM | POA: Diagnosis not present

## 2021-10-01 DIAGNOSIS — Z20822 Contact with and (suspected) exposure to covid-19: Secondary | ICD-10-CM | POA: Diagnosis not present

## 2021-10-04 NOTE — Progress Notes (Signed)
Remote ICD transmission.   

## 2021-10-05 ENCOUNTER — Encounter: Payer: Self-pay | Admitting: Physical Medicine & Rehabilitation

## 2021-10-05 ENCOUNTER — Encounter (HOSPITAL_BASED_OUTPATIENT_CLINIC_OR_DEPARTMENT_OTHER): Payer: Medicare Other | Admitting: Physical Medicine & Rehabilitation

## 2021-10-05 ENCOUNTER — Other Ambulatory Visit: Payer: Self-pay

## 2021-10-05 VITALS — BP 99/58 | HR 82 | Temp 98.4°F | Ht 64.5 in | Wt 245.0 lb

## 2021-10-05 DIAGNOSIS — M170 Bilateral primary osteoarthritis of knee: Secondary | ICD-10-CM

## 2021-10-05 DIAGNOSIS — Z5181 Encounter for therapeutic drug level monitoring: Secondary | ICD-10-CM | POA: Diagnosis not present

## 2021-10-05 DIAGNOSIS — G894 Chronic pain syndrome: Secondary | ICD-10-CM | POA: Diagnosis not present

## 2021-10-05 DIAGNOSIS — Z79891 Long term (current) use of opiate analgesic: Secondary | ICD-10-CM | POA: Diagnosis not present

## 2021-10-05 DIAGNOSIS — M545 Low back pain, unspecified: Secondary | ICD-10-CM | POA: Diagnosis not present

## 2021-10-05 DIAGNOSIS — G8929 Other chronic pain: Secondary | ICD-10-CM | POA: Diagnosis not present

## 2021-10-05 NOTE — Progress Notes (Signed)
?PROCEDURE RECORD ?[facility] ? ? ?Name: Crystal [name] ?DOB:05/13/1951 ?MRN: [id number] ? ?Date:10/05/2021  Physician: Alysia Penna, MD   ? ?Nurse/CMA: Truman Hayward, CMA ? ?Allergies:  ?Allergies  ?Allergen Reactions  ? Ace Inhibitors Swelling and Other (See Comments)  ?  Angioedema ?Other reaction(s): swelling ?Other reaction(s): swelling ?Other reaction(s): swelling  ? Glucophage [Metformin Hcl] Other (See Comments)  ?  Renal failure  ? Telmisartan Other (See Comments)  ?  AVOID ARB/ ACEi in this patient due to recurrent AKI  ? Advicor [Niacin-Lovastatin Er] Other (See Comments)  ?  Muscle aches  ? Bystolic [Nebivolol Hcl] Swelling  ?  Whole body swelled  ? Erythromycin Diarrhea, Nausea And Vomiting and Other (See Comments)  ?  *DERIVATIVES* ?Other reaction(s): severe vomiting ?Other reaction(s): severe vomiting ?Other reaction(s): severe vomiting  ? Lipitor [Atorvastatin] Other (See Comments)  ?  Muscle aches  ? Lopid [Gemfibrozil] Other (See Comments)  ?  Muscle aches  ? Statins Other (See Comments)  ?  Muscle aches tolerates crestor  ? Hydromorphone Other (See Comments)  ?  Confusion ?Other reaction(s): confusion.can take lower level narcs but those cause a lot of itching  ? Losartan   ?  Other reaction(s): stopped 12/2018. ? due to worse gfr ?Other reaction(s): stopped 12/2018. ? due to worse gfr ?Other reaction(s): stopped 12/2018. ? due to worse gfr  ? Nebivolol Hcl   ?  Other reaction(s): swelling ?Other reaction(s): swelling ?Other reaction(s): swelling  ? Other   ?  Other reaction(s): muscle aches  ? [place]   ?  Other reaction(s): itching ?Other reaction(s): itching ?Other reaction(s): itching  ? Spironolactone   ?  Other reaction(s): arf ?Other reaction(s): arf ?Other reaction(s): arf  ? ? ?Consent Signed: Yes.    Is patient diabetic? Yes.    CBG today? 163 ? ?Pregnant: No. LMP: No LMP recorded. Patient has had a hysterectomy. (age 40-55) ? ?Anticoagulants:  no ?Anti-inflammatory: no ?Antibiotics: no ? ?Procedure: Left Knee Genicular Radiofrequency  Position: Supine ?Start Time: 12:53 pm  End Time: 1:18 pm  Fluoro Time: 37 ? ?[place] Truman Hayward, [place], CMA    ?Time 12:40 pm 1:21 pm    ?BP 99/58 120/64    ?Pulse 82 77    ?Respirations 16 16    ?O2 Sat 92 93    ?S/S 6 6    ?Pain Level 5/10 0/10    ? ?D/C home with no one, patient A & O X 3, D/C instructions reviewed, and sits independently. ? ? ? ? ? ? ?Left genicular nerve radiofrequency neurotomy under fluoroscopic guidance ? ?Indication Chronic severe  knee pain that has not responded to PT, medication, and other conservative care.THere has been 50% relief of usual knee pain with genicular nerve blocks under fluoroscopic guidance ? ?Informed consent was obtained after discussing risks and benefits of procedure with the patient.  These include bleeding, bruising and infection as well as foot numbness. ?Pt place in supine position on the fluoro table .  Static images identified distal femur and proximal tibia.  Lateral and medial supracondylar , as well as medial tibial flare prepped with betadine.  Marked under fluoro and then a 25 g 1.5 inch needle was used to anesthetize skin and subcutaneous tissue. 1.5 cc was infiltrate into each of 3 sites. Then a 18-gauge 10cm RF needle with a 16m curved active  tip  was inserted under fluoroscopic guidance first targeting the medial tibial flare midpoint. After bone contact  was made lateral images confirmed proper positioning at midpoint of shart ,then Motor stim at 2 Hz demonstrated no motor twitch followed by injection of 32m of 2% lidocaine and radiofrequency lesioning 80 deg for 90 sec. . This same procedure was treated for the lateral and medial supracondylar areas using same equipment and technique. Patient tolerated procedure well. Post procedure instructions given.  ?

## 2021-10-05 NOTE — Patient Instructions (Addendum)
Genicular nerve RF neurotomieswere performed today. This is to block pain signals from the knee. A local anesthetic was used to block the knee and will wear off in 6-8 hrs  Please keep track of your pain today and compared to the pain that you had prior to the injection., you may have post procedure soreness for up to a month .   ?

## 2021-10-08 DIAGNOSIS — Z20822 Contact with and (suspected) exposure to covid-19: Secondary | ICD-10-CM | POA: Diagnosis not present

## 2021-10-14 DIAGNOSIS — Z20822 Contact with and (suspected) exposure to covid-19: Secondary | ICD-10-CM | POA: Diagnosis not present

## 2021-10-18 DIAGNOSIS — M65872 Other synovitis and tenosynovitis, left ankle and foot: Secondary | ICD-10-CM | POA: Diagnosis not present

## 2021-10-18 DIAGNOSIS — M25572 Pain in left ankle and joints of left foot: Secondary | ICD-10-CM | POA: Diagnosis not present

## 2021-10-20 DIAGNOSIS — Z20822 Contact with and (suspected) exposure to covid-19: Secondary | ICD-10-CM | POA: Diagnosis not present

## 2021-10-21 DIAGNOSIS — N184 Chronic kidney disease, stage 4 (severe): Secondary | ICD-10-CM | POA: Diagnosis not present

## 2021-10-23 DIAGNOSIS — Z20822 Contact with and (suspected) exposure to covid-19: Secondary | ICD-10-CM | POA: Diagnosis not present

## 2021-10-25 DIAGNOSIS — N2581 Secondary hyperparathyroidism of renal origin: Secondary | ICD-10-CM | POA: Diagnosis not present

## 2021-10-25 DIAGNOSIS — I5042 Chronic combined systolic (congestive) and diastolic (congestive) heart failure: Secondary | ICD-10-CM | POA: Diagnosis not present

## 2021-10-25 DIAGNOSIS — D509 Iron deficiency anemia, unspecified: Secondary | ICD-10-CM | POA: Diagnosis not present

## 2021-10-25 DIAGNOSIS — N184 Chronic kidney disease, stage 4 (severe): Secondary | ICD-10-CM | POA: Diagnosis not present

## 2021-10-25 DIAGNOSIS — E1122 Type 2 diabetes mellitus with diabetic chronic kidney disease: Secondary | ICD-10-CM | POA: Diagnosis not present

## 2021-10-25 DIAGNOSIS — I129 Hypertensive chronic kidney disease with stage 1 through stage 4 chronic kidney disease, or unspecified chronic kidney disease: Secondary | ICD-10-CM | POA: Diagnosis not present

## 2021-10-26 ENCOUNTER — Encounter: Payer: Self-pay | Admitting: Registered Nurse

## 2021-10-26 ENCOUNTER — Encounter: Payer: Medicare Other | Attending: Registered Nurse | Admitting: Registered Nurse

## 2021-10-26 VITALS — BP 90/56 | HR 75 | Ht 64.5 in | Wt 242.2 lb

## 2021-10-26 DIAGNOSIS — M170 Bilateral primary osteoarthritis of knee: Secondary | ICD-10-CM | POA: Insufficient documentation

## 2021-10-26 DIAGNOSIS — I10 Essential (primary) hypertension: Secondary | ICD-10-CM | POA: Diagnosis not present

## 2021-10-26 DIAGNOSIS — Z20822 Contact with and (suspected) exposure to covid-19: Secondary | ICD-10-CM | POA: Diagnosis present

## 2021-10-26 DIAGNOSIS — K56609 Unspecified intestinal obstruction, unspecified as to partial versus complete obstruction: Secondary | ICD-10-CM | POA: Diagnosis not present

## 2021-10-26 DIAGNOSIS — K5669 Other partial intestinal obstruction: Secondary | ICD-10-CM | POA: Diagnosis not present

## 2021-10-26 DIAGNOSIS — G894 Chronic pain syndrome: Secondary | ICD-10-CM | POA: Insufficient documentation

## 2021-10-26 DIAGNOSIS — E875 Hyperkalemia: Secondary | ICD-10-CM | POA: Diagnosis not present

## 2021-10-26 DIAGNOSIS — N184 Chronic kidney disease, stage 4 (severe): Secondary | ICD-10-CM | POA: Diagnosis not present

## 2021-10-26 DIAGNOSIS — D509 Iron deficiency anemia, unspecified: Secondary | ICD-10-CM | POA: Diagnosis present

## 2021-10-26 DIAGNOSIS — Z885 Allergy status to narcotic agent status: Secondary | ICD-10-CM | POA: Diagnosis not present

## 2021-10-26 DIAGNOSIS — Z981 Arthrodesis status: Secondary | ICD-10-CM | POA: Diagnosis not present

## 2021-10-26 DIAGNOSIS — Z79891 Long term (current) use of opiate analgesic: Secondary | ICD-10-CM | POA: Insufficient documentation

## 2021-10-26 DIAGNOSIS — E785 Hyperlipidemia, unspecified: Secondary | ICD-10-CM | POA: Diagnosis present

## 2021-10-26 DIAGNOSIS — E1142 Type 2 diabetes mellitus with diabetic polyneuropathy: Secondary | ICD-10-CM | POA: Diagnosis present

## 2021-10-26 DIAGNOSIS — Z881 Allergy status to other antibiotic agents status: Secondary | ICD-10-CM | POA: Diagnosis not present

## 2021-10-26 DIAGNOSIS — I428 Other cardiomyopathies: Secondary | ICD-10-CM | POA: Diagnosis present

## 2021-10-26 DIAGNOSIS — I2510 Atherosclerotic heart disease of native coronary artery without angina pectoris: Secondary | ICD-10-CM | POA: Diagnosis present

## 2021-10-26 DIAGNOSIS — R109 Unspecified abdominal pain: Secondary | ICD-10-CM | POA: Diagnosis not present

## 2021-10-26 DIAGNOSIS — R1111 Vomiting without nausea: Secondary | ICD-10-CM | POA: Diagnosis not present

## 2021-10-26 DIAGNOSIS — G8929 Other chronic pain: Secondary | ICD-10-CM | POA: Insufficient documentation

## 2021-10-26 DIAGNOSIS — E1122 Type 2 diabetes mellitus with diabetic chronic kidney disease: Secondary | ICD-10-CM | POA: Diagnosis present

## 2021-10-26 DIAGNOSIS — Z95810 Presence of automatic (implantable) cardiac defibrillator: Secondary | ICD-10-CM | POA: Diagnosis not present

## 2021-10-26 DIAGNOSIS — N289 Disorder of kidney and ureter, unspecified: Secondary | ICD-10-CM | POA: Diagnosis not present

## 2021-10-26 DIAGNOSIS — E871 Hypo-osmolality and hyponatremia: Secondary | ICD-10-CM | POA: Diagnosis present

## 2021-10-26 DIAGNOSIS — D649 Anemia, unspecified: Secondary | ICD-10-CM | POA: Diagnosis not present

## 2021-10-26 DIAGNOSIS — E119 Type 2 diabetes mellitus without complications: Secondary | ICD-10-CM | POA: Diagnosis not present

## 2021-10-26 DIAGNOSIS — M545 Low back pain, unspecified: Secondary | ICD-10-CM | POA: Diagnosis not present

## 2021-10-26 DIAGNOSIS — I5022 Chronic systolic (congestive) heart failure: Secondary | ICD-10-CM | POA: Diagnosis present

## 2021-10-26 DIAGNOSIS — E039 Hypothyroidism, unspecified: Secondary | ICD-10-CM | POA: Diagnosis not present

## 2021-10-26 DIAGNOSIS — N1832 Chronic kidney disease, stage 3b: Secondary | ICD-10-CM | POA: Diagnosis not present

## 2021-10-26 DIAGNOSIS — K436 Other and unspecified ventral hernia with obstruction, without gangrene: Secondary | ICD-10-CM | POA: Diagnosis not present

## 2021-10-26 DIAGNOSIS — F32A Depression, unspecified: Secondary | ICD-10-CM | POA: Diagnosis present

## 2021-10-26 DIAGNOSIS — M109 Gout, unspecified: Secondary | ICD-10-CM | POA: Diagnosis present

## 2021-10-26 DIAGNOSIS — K6389 Other specified diseases of intestine: Secondary | ICD-10-CM | POA: Diagnosis not present

## 2021-10-26 DIAGNOSIS — I130 Hypertensive heart and chronic kidney disease with heart failure and stage 1 through stage 4 chronic kidney disease, or unspecified chronic kidney disease: Secondary | ICD-10-CM | POA: Diagnosis not present

## 2021-10-26 DIAGNOSIS — R739 Hyperglycemia, unspecified: Secondary | ICD-10-CM | POA: Diagnosis not present

## 2021-10-26 DIAGNOSIS — Z5181 Encounter for therapeutic drug level monitoring: Secondary | ICD-10-CM | POA: Insufficient documentation

## 2021-10-26 DIAGNOSIS — N179 Acute kidney failure, unspecified: Secondary | ICD-10-CM | POA: Diagnosis not present

## 2021-10-26 DIAGNOSIS — Z794 Long term (current) use of insulin: Secondary | ICD-10-CM | POA: Diagnosis not present

## 2021-10-26 DIAGNOSIS — E869 Volume depletion, unspecified: Secondary | ICD-10-CM | POA: Diagnosis not present

## 2021-10-26 DIAGNOSIS — M2550 Pain in unspecified joint: Secondary | ICD-10-CM | POA: Diagnosis not present

## 2021-10-26 DIAGNOSIS — R1084 Generalized abdominal pain: Secondary | ICD-10-CM | POA: Diagnosis not present

## 2021-10-26 DIAGNOSIS — K219 Gastro-esophageal reflux disease without esophagitis: Secondary | ICD-10-CM | POA: Diagnosis present

## 2021-10-26 DIAGNOSIS — D508 Other iron deficiency anemias: Secondary | ICD-10-CM | POA: Diagnosis not present

## 2021-10-26 DIAGNOSIS — Z6841 Body Mass Index (BMI) 40.0 and over, adult: Secondary | ICD-10-CM | POA: Diagnosis not present

## 2021-10-26 DIAGNOSIS — K5900 Constipation, unspecified: Secondary | ICD-10-CM | POA: Diagnosis not present

## 2021-10-26 DIAGNOSIS — Z950 Presence of cardiac pacemaker: Secondary | ICD-10-CM | POA: Diagnosis not present

## 2021-10-26 DIAGNOSIS — K439 Ventral hernia without obstruction or gangrene: Secondary | ICD-10-CM | POA: Diagnosis not present

## 2021-10-26 DIAGNOSIS — I509 Heart failure, unspecified: Secondary | ICD-10-CM | POA: Diagnosis not present

## 2021-10-26 DIAGNOSIS — Z888 Allergy status to other drugs, medicaments and biological substances status: Secondary | ICD-10-CM | POA: Diagnosis not present

## 2021-10-26 DIAGNOSIS — J45909 Unspecified asthma, uncomplicated: Secondary | ICD-10-CM | POA: Diagnosis present

## 2021-10-26 DIAGNOSIS — R0902 Hypoxemia: Secondary | ICD-10-CM | POA: Diagnosis not present

## 2021-10-26 DIAGNOSIS — Z4682 Encounter for fitting and adjustment of non-vascular catheter: Secondary | ICD-10-CM | POA: Diagnosis not present

## 2021-10-26 MED ORDER — HYDROCODONE-ACETAMINOPHEN 10-325 MG PO TABS: 1.0000 | ORAL_TABLET | Freq: Two times a day (BID) | ORAL | 0 refills | Status: DC | PRN

## 2021-10-26 NOTE — Progress Notes (Signed)
? ?Subjective:  ? ? Patient ID: Crystal [name], female    DOB: 08/08/1950, 71 y.o.   MRN: [id number] ? ?HPI: Crystal [name] is a 71 y.o. female who returns for follow up appointment for chronic pain and medication refill. She states her  pain is located in her lower back, bilateral knee pain and generalized joint pain. She  rates her pain 7. Her current exercise regime is walking short distances with her cane  and performing stretching exercises. ? ?Ms. Kirstein Morphine equivalent is 20.00  MME.  Oral Swab was Performed today.   ?  ? ?Pain Inventory ?Average Pain 8 ?Pain Right Now 7 ?My pain is sharp, burning, stabbing, and aching ? ?In the last 24 hours, has pain interfered with the following? ?General activity 6 ?Relation with others 6 ?Enjoyment of life 6 ?What TIME of day is your pain at its worst? morning  and evening ?Sleep (in general) Fair ? ?Pain is worse with: walking, bending, standing, and some activites ?Pain improves with: rest, pacing activities, medication, and injections ?Relief from Meds: 7 ? ?Family History  ?Problem Relation Age of Onset  ? Hypertension Father   ? Heart disease Father   ? Cancer Father   ?     prostate  ? Heart disease Other   ?     (Maternal side) Ischemic heart disease  ? Diabetes Mellitus II Other   ? Arrhythmia Mother   ? Diabetes Mellitus II Mother   ?     Borderline DM  ? Hypertension Mother   ? Asthma Mother   ? Cancer Brother   ? Dementia Brother   ? Hypertension Brother   ? Hypertension Daughter   ? Hypertension Daughter   ? Diabetes Daughter   ? Hypertension Daughter   ? Atrial fibrillation Daughter   ? GER disease Daughter   ? Hypertension Son   ? Anxiety disorder Son   ? Hypothyroidism Son   ? Breast cancer Maternal Grandmother   ?     in her 34's  ? ?Social History  ? ?Socioeconomic History  ? Marital status: Widowed  ?  Spouse name: Not on file  ? Number of children: Not on file  ? Years of education: Not on file  ? Highest education level: Not on file  ?Occupational  History  ?  Comment: retired LPN  ?Tobacco Use  ? Smoking status: Never  ? Smokeless tobacco: Never  ?Vaping Use  ? Vaping Use: Never used  ?Substance and Sexual Activity  ? Alcohol use: Not Currently  ? Drug use: Never  ? Sexual activity: Not Currently  ?Other Topics Concern  ? Not on file  ?Social History Narrative  ? Pt lives in [place] alone.  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  ?   ? As of 03/15/17:  ? Diet: 1800 Calorie  ?   ? Caffeine: Yes  ?   ? Married, if yes what year: Widowed, married 1973  ?   ? Do you live in a house, apartment, assisted living, condo, trailer, ect: House, 1 stories, and 1 person  ?   ? Pets: No  ?   ? Current/Past profession: LPN, retired   ?   ? Exercise: Yes, walking   ?   ?   ? Living Will: Yes  ? DNR: No  ? POA/HPOA: Yes  ?   ? Functional Status:  ? Do you have  difficulty bathing or dressing yourself? No  ? Do you have difficulty preparing food or eating? No  ? Do you have difficulty managing your medications? No  ? Do you have difficulty managing your finances? No  ? Do you have difficulty affording your medications? Yes  ? ?Social Determinants of Health  ? ?Financial Resource Strain: Not on file  ?Food Insecurity: Not on file  ?Transportation Needs: Not on file  ?Physical Activity: Not on file  ?Stress: Not on file  ?Social Connections: Not on file  ? ?Past Surgical History:  ?Procedure Laterality Date  ? APPENDECTOMY  07/13/2017  ? BLADDER SUSPENSION  1990  ? "w/hysterectomy"  ? BOWEL RESECTION N/A 07/09/2018  ? Procedure: SMALL BOWEL RESECTION;  Surgeon: Judeth Horn, MD;  Location: [place];  Service: General;  Laterality: N/A;  ? CARDIAC CATHETERIZATION  2005  ? no obstructive CAD per patient  ? CARDIAC CATHETERIZATION  2018  ? CARDIAC DEFIBRILLATOR PLACEMENT  2005  ? BiV ICD implanted,  LV lead is an epicardial lead  ? CARPAL TUNNEL RELEASE Left 07/25/2014  ? Dr.Williamson   ? CARPAL TUNNEL RELEASE Right 04/08/2019  ?  Procedure: RIGHT CARPAL TUNNEL RELEASE ENDOSCOPIC;  Surgeon: Milly Jakob, MD;  Location: [place];  Service: Orthopedics;  Laterality: Right;  ? CATARACT EXTRACTION W/ INTRAOCULAR LENS  IMPLANT, BILATERAL Bilateral   ? COLONIC STENT PLACEMENT N/A 08/31/2017  ? Procedure: COLONIC STENT PLACEMENT;  Surgeon: Carol Ada, MD;  Location: [place];  Service: Endoscopy;  Laterality: N/A;  ? COLONOSCOPY    ? 2010-2011 Dr.Kipreos   ? COLOSTOMY  12/2016  ? Archie Endo 01/26/2017  ? COLOSTOMY REVERSAL N/A 07/13/2017  ? Procedure: COLOSTOMY REVERSAL;  Surgeon: Judeth Horn, MD;  Location: [place];  Service: General;  Laterality: N/A;  ? CYST REMOVAL HAND Right 05/2013  ? thumb  ? DILATION AND CURETTAGE OF UTERUS  X 5-6  ? ESOPHAGOGASTRODUODENOSCOPY (EGD) WITH PROPOFOL N/A 03/13/2020  ? Procedure: ESOPHAGOGASTRODUODENOSCOPY (EGD) WITH PROPOFOL;  Surgeon: Carol Ada, MD;  [facility];  Service: Endoscopy;  Laterality: N/A;  ? EXCISION MASS ABDOMINAL N/A 07/09/2018  ? Procedure: EXPLORATION OF  ABDOMINAL WOUND ERAS PATHWAY;  Surgeon: Judeth Horn, MD;  Location: [place];  Service: General;  Laterality: N/A;  ? FLEXIBLE SIGMOIDOSCOPY N/A 08/24/2017  ? Procedure: FLEXIBLE SIGMOIDOSCOPY;  Surgeon: Carol Ada, MD;  Location: [place];  Service: Endoscopy;  Laterality: N/A;  ? FLEXIBLE SIGMOIDOSCOPY N/A 08/31/2017  ? Procedure: FLEXIBLE SIGMOIDOSCOPY;  Surgeon: Carol Ada, MD;  Location: [place];  Service: Endoscopy;  Laterality: N/A;  stent placement  ? FLEXIBLE SIGMOIDOSCOPY N/A 03/13/2020  ? Procedure: FLEXIBLE SIGMOIDOSCOPY;  Surgeon: Carol Ada, MD;  Location[facility];  Service: Endoscopy;  Laterality: N/A;  ? FLEXIBLE SIGMOIDOSCOPY N/A 12/18/2020  ? Procedure: FLEXIBLE SIGMOIDOSCOPY;  Surgeon: Carol Ada, MD;  Location: [facility];  Service: Endoscopy;  Laterality: N/A;  ? HEMOSTASIS CLIP PLACEMENT  03/13/2020  ? Procedure: HEMOSTASIS CLIP PLACEMENT;  Surgeon: Carol Ada, MD;  [facility];  Service: Endoscopy;;  ? HEMOSTASIS CLIP PLACEMENT  12/18/2020  ? Procedure: HEMOSTASIS CLIP PLACEMENT;  Surgeon: Carol Ada, MD;  [facility];  Service: Endoscopy;;  ? HOT HEMOSTASIS N/A 03/13/2020  ? Procedure: HOT HEMOSTASIS (ARGON PLASMA COAGULATION/BICAP);  Surgeon: Carol Ada, MD;  Location[facility];  Service: Endoscopy;  Laterality: N/A;  ? HOT HEMOSTASIS N/A 12/18/2020  ? Procedure: HOT HEMOSTASIS (ARGON PLASMA COAGULATION/BICAP);  Surgeon: Carol Ada, MD;  Location[facility];  Service: Endoscopy;  Laterality: N/A;  ? IMPLANTABLE CARDIOVERTER DEFIBRILLATOR GENERATOR CHANGE  2008  ? INSERTION OF MESH N/A 07/09/2018  ? Procedure: INSERTION OF VICRYL MESH;  Surgeon: Judeth Horn, MD;  Location: [place];  Service: General;  Laterality: N/A;  ? LAPAROTOMY N/A 09/18/2017  ? Procedure: EXPLORATORY LAPAROTOMY;  Surgeon: Judeth Horn, MD;  Location: [place];  Service: General;  Laterality: N/A;  ? LEAD REVISION  06/22/2018  ? LEAD REVISION/REPAIR N/A 06/22/2018  ? Procedure: LEAD REVISION/REPAIR;  Surgeon: Deboraha Sprang, MD;  Location: [place] [facility];  Service: Cardiovascular;  Laterality: N/A;  ? LYSIS OF ADHESION N/A 09/18/2017  ? Procedure: LYSIS OF ADHESION;  Surgeon: Judeth Horn, MD;  Location: [place];  Service: General;  Laterality: N/A;  ? LYSIS OF ADHESION N/A 07/09/2018  ? Procedure: LYSIS OF ADHESION;  Surgeon: Judeth Horn, MD;  Location: [place];  Service: General;  Laterality: N/A;  ? PARTIAL COLECTOMY N/A 09/18/2017  ? Procedure: ILEOCOLECTOMY;  Surgeon: Judeth Horn, MD;  Location: [place];  Service: General;  Laterality: N/A;  ? PARTIAL COLECTOMY  09/25/2017  ? POLYPECTOMY  03/13/2020  ? Procedure: POLYPECTOMY;  Surgeon: Carol Ada, MD;  [facility];  Service: Endoscopy;;  ? RIGHT/LEFT HEART CATH AND CORONARY ANGIOGRAPHY N/A 06/01/2018  ? Procedure: RIGHT/LEFT HEART CATH AND CORONARY ANGIOGRAPHY;  Surgeon: Jettie Booze, MD;  Location: [place] [facility];  Service: Cardiovascular;  Laterality: N/A;  ? SIGMOIDOSCOPY N/A 09/18/2017  ? Procedure: SIGMOIDOSCOPY;  Surgeon: Judeth Horn, MD;  Location: [place];  Service: General;  Laterality: N/A;  ? SMALL INTESTINE SURG

## 2021-10-27 ENCOUNTER — Emergency Department (HOSPITAL_COMMUNITY): Payer: Medicare Other

## 2021-10-27 ENCOUNTER — Other Ambulatory Visit: Payer: Self-pay

## 2021-10-27 ENCOUNTER — Encounter (HOSPITAL_COMMUNITY): Payer: Self-pay

## 2021-10-27 ENCOUNTER — Inpatient Hospital Stay (HOSPITAL_COMMUNITY)
Admission: EM | Admit: 2021-10-27 | Discharge: 2021-10-30 | DRG: 394 | Disposition: A | Discharge status: Home / Self Care | Payer: Medicare Other | Attending: Osteopathic Medicine | Admitting: Osteopathic Medicine

## 2021-10-27 ENCOUNTER — Inpatient Hospital Stay (HOSPITAL_COMMUNITY): Payer: Medicare Other

## 2021-10-27 DIAGNOSIS — I10 Essential (primary) hypertension: Secondary | ICD-10-CM | POA: Diagnosis present

## 2021-10-27 DIAGNOSIS — F32A Depression, unspecified: Secondary | ICD-10-CM | POA: Diagnosis present

## 2021-10-27 DIAGNOSIS — Z20822 Contact with and (suspected) exposure to covid-19: Secondary | ICD-10-CM | POA: Diagnosis present

## 2021-10-27 DIAGNOSIS — E875 Hyperkalemia: Secondary | ICD-10-CM | POA: Diagnosis present

## 2021-10-27 DIAGNOSIS — Z8249 Family history of ischemic heart disease and other diseases of the circulatory system: Secondary | ICD-10-CM

## 2021-10-27 DIAGNOSIS — G8929 Other chronic pain: Secondary | ICD-10-CM | POA: Diagnosis present

## 2021-10-27 DIAGNOSIS — I509 Heart failure, unspecified: Secondary | ICD-10-CM | POA: Diagnosis not present

## 2021-10-27 DIAGNOSIS — Z981 Arthrodesis status: Secondary | ICD-10-CM

## 2021-10-27 DIAGNOSIS — Z6841 Body Mass Index (BMI) 40.0 and over, adult: Secondary | ICD-10-CM

## 2021-10-27 DIAGNOSIS — Z95810 Presence of automatic (implantable) cardiac defibrillator: Secondary | ICD-10-CM | POA: Diagnosis not present

## 2021-10-27 DIAGNOSIS — Z888 Allergy status to other drugs, medicaments and biological substances status: Secondary | ICD-10-CM | POA: Diagnosis not present

## 2021-10-27 DIAGNOSIS — J45909 Unspecified asthma, uncomplicated: Secondary | ICD-10-CM | POA: Diagnosis present

## 2021-10-27 DIAGNOSIS — D649 Anemia, unspecified: Secondary | ICD-10-CM | POA: Diagnosis not present

## 2021-10-27 DIAGNOSIS — Z881 Allergy status to other antibiotic agents status: Secondary | ICD-10-CM

## 2021-10-27 DIAGNOSIS — Z818 Family history of other mental and behavioral disorders: Secondary | ICD-10-CM

## 2021-10-27 DIAGNOSIS — E871 Hypo-osmolality and hyponatremia: Secondary | ICD-10-CM | POA: Diagnosis present

## 2021-10-27 DIAGNOSIS — N183 Chronic kidney disease, stage 3 unspecified: Secondary | ICD-10-CM | POA: Diagnosis present

## 2021-10-27 DIAGNOSIS — I2510 Atherosclerotic heart disease of native coronary artery without angina pectoris: Secondary | ICD-10-CM | POA: Diagnosis present

## 2021-10-27 DIAGNOSIS — E1122 Type 2 diabetes mellitus with diabetic chronic kidney disease: Secondary | ICD-10-CM | POA: Diagnosis present

## 2021-10-27 DIAGNOSIS — I5022 Chronic systolic (congestive) heart failure: Secondary | ICD-10-CM | POA: Diagnosis present

## 2021-10-27 DIAGNOSIS — E785 Hyperlipidemia, unspecified: Secondary | ICD-10-CM | POA: Diagnosis present

## 2021-10-27 DIAGNOSIS — D509 Iron deficiency anemia, unspecified: Secondary | ICD-10-CM | POA: Diagnosis present

## 2021-10-27 DIAGNOSIS — D508 Other iron deficiency anemias: Secondary | ICD-10-CM | POA: Diagnosis not present

## 2021-10-27 DIAGNOSIS — K219 Gastro-esophageal reflux disease without esophagitis: Secondary | ICD-10-CM | POA: Diagnosis present

## 2021-10-27 DIAGNOSIS — K436 Other and unspecified ventral hernia with obstruction, without gangrene: Principal | ICD-10-CM | POA: Diagnosis present

## 2021-10-27 DIAGNOSIS — M109 Gout, unspecified: Secondary | ICD-10-CM | POA: Diagnosis present

## 2021-10-27 DIAGNOSIS — I255 Ischemic cardiomyopathy: Secondary | ICD-10-CM | POA: Diagnosis present

## 2021-10-27 DIAGNOSIS — I130 Hypertensive heart and chronic kidney disease with heart failure and stage 1 through stage 4 chronic kidney disease, or unspecified chronic kidney disease: Secondary | ICD-10-CM | POA: Diagnosis present

## 2021-10-27 DIAGNOSIS — E1142 Type 2 diabetes mellitus with diabetic polyneuropathy: Secondary | ICD-10-CM | POA: Diagnosis present

## 2021-10-27 DIAGNOSIS — I252 Old myocardial infarction: Secondary | ICD-10-CM

## 2021-10-27 DIAGNOSIS — R739 Hyperglycemia, unspecified: Secondary | ICD-10-CM | POA: Diagnosis not present

## 2021-10-27 DIAGNOSIS — N289 Disorder of kidney and ureter, unspecified: Secondary | ICD-10-CM | POA: Diagnosis not present

## 2021-10-27 DIAGNOSIS — E119 Type 2 diabetes mellitus without complications: Secondary | ICD-10-CM

## 2021-10-27 DIAGNOSIS — R1111 Vomiting without nausea: Secondary | ICD-10-CM | POA: Diagnosis not present

## 2021-10-27 DIAGNOSIS — R1084 Generalized abdominal pain: Secondary | ICD-10-CM | POA: Diagnosis not present

## 2021-10-27 DIAGNOSIS — Z9981 Dependence on supplemental oxygen: Secondary | ICD-10-CM

## 2021-10-27 DIAGNOSIS — N184 Chronic kidney disease, stage 4 (severe): Secondary | ICD-10-CM | POA: Diagnosis not present

## 2021-10-27 DIAGNOSIS — I428 Other cardiomyopathies: Secondary | ICD-10-CM | POA: Diagnosis present

## 2021-10-27 DIAGNOSIS — R109 Unspecified abdominal pain: Secondary | ICD-10-CM | POA: Diagnosis not present

## 2021-10-27 DIAGNOSIS — N1832 Chronic kidney disease, stage 3b: Secondary | ICD-10-CM | POA: Diagnosis not present

## 2021-10-27 DIAGNOSIS — Z4682 Encounter for fitting and adjustment of non-vascular catheter: Secondary | ICD-10-CM | POA: Diagnosis not present

## 2021-10-27 DIAGNOSIS — Z79899 Other long term (current) drug therapy: Secondary | ICD-10-CM

## 2021-10-27 DIAGNOSIS — E039 Hypothyroidism, unspecified: Secondary | ICD-10-CM | POA: Diagnosis present

## 2021-10-27 DIAGNOSIS — E869 Volume depletion, unspecified: Secondary | ICD-10-CM | POA: Diagnosis not present

## 2021-10-27 DIAGNOSIS — Z794 Long term (current) use of insulin: Secondary | ICD-10-CM

## 2021-10-27 DIAGNOSIS — Z833 Family history of diabetes mellitus: Secondary | ICD-10-CM

## 2021-10-27 DIAGNOSIS — K56609 Unspecified intestinal obstruction, unspecified as to partial versus complete obstruction: Secondary | ICD-10-CM

## 2021-10-27 DIAGNOSIS — K439 Ventral hernia without obstruction or gangrene: Secondary | ICD-10-CM | POA: Diagnosis not present

## 2021-10-27 DIAGNOSIS — Z885 Allergy status to narcotic agent status: Secondary | ICD-10-CM

## 2021-10-27 DIAGNOSIS — Z9049 Acquired absence of other specified parts of digestive tract: Secondary | ICD-10-CM

## 2021-10-27 DIAGNOSIS — K5900 Constipation, unspecified: Secondary | ICD-10-CM | POA: Diagnosis not present

## 2021-10-27 DIAGNOSIS — R339 Retention of urine, unspecified: Secondary | ICD-10-CM | POA: Diagnosis present

## 2021-10-27 DIAGNOSIS — Z79890 Hormone replacement therapy (postmenopausal): Secondary | ICD-10-CM

## 2021-10-27 DIAGNOSIS — N179 Acute kidney failure, unspecified: Secondary | ICD-10-CM

## 2021-10-27 DIAGNOSIS — Z825 Family history of asthma and other chronic lower respiratory diseases: Secondary | ICD-10-CM

## 2021-10-27 DIAGNOSIS — K5669 Other partial intestinal obstruction: Secondary | ICD-10-CM | POA: Diagnosis not present

## 2021-10-27 DIAGNOSIS — K6389 Other specified diseases of intestine: Secondary | ICD-10-CM | POA: Diagnosis not present

## 2021-10-27 DIAGNOSIS — I447 Left bundle-branch block, unspecified: Secondary | ICD-10-CM | POA: Diagnosis present

## 2021-10-27 DIAGNOSIS — Z950 Presence of cardiac pacemaker: Secondary | ICD-10-CM | POA: Diagnosis not present

## 2021-10-27 DIAGNOSIS — R0902 Hypoxemia: Secondary | ICD-10-CM | POA: Diagnosis not present

## 2021-10-27 DIAGNOSIS — G4733 Obstructive sleep apnea (adult) (pediatric): Secondary | ICD-10-CM | POA: Diagnosis present

## 2021-10-27 LAB — RESP PANEL BY RT-PCR (FLU A&B, COVID) ARPGX2
Influenza A by PCR: NEGATIVE
Influenza B by PCR: NEGATIVE
SARS Coronavirus 2 by RT PCR: NEGATIVE

## 2021-10-27 LAB — LIPASE, BLOOD: Lipase: 27 U/L (ref 11–51)

## 2021-10-27 LAB — POTASSIUM
Potassium: 6.5 mmol/L (ref 3.5–5.1)
Potassium: 5.3 mmol/L — ABNORMAL HIGH (ref 3.5–5.1)
Potassium: 5.5 mmol/L — ABNORMAL HIGH (ref 3.5–5.1)

## 2021-10-27 LAB — URINALYSIS, ROUTINE W REFLEX MICROSCOPIC
Bilirubin Urine: NEGATIVE
Glucose, UA: >=500 mg/dL — AB
Hgb urine dipstick: NEGATIVE
Ketones, ur: NEGATIVE mg/dL
Leukocytes,Ua: NEGATIVE
Nitrite: NEGATIVE
Protein, ur: 30 mg/dL — AB
Specific Gravity, Urine: 1.009 (ref 1.005–1.030)
pH: 5 (ref 5.0–8.0)

## 2021-10-27 LAB — CBC WITH DIFFERENTIAL/PLATELET
Abs Immature Granulocytes: 0.1 10*3/uL — ABNORMAL HIGH (ref 0.00–0.07)
Basophils Absolute: 0.1 10*3/uL (ref 0.0–0.1)
Basophils Relative: 0 %
Eosinophils Absolute: 0.3 10*3/uL (ref 0.0–0.5)
Eosinophils Relative: 2 %
HCT: 40.8 % (ref 36.0–46.0)
Hemoglobin: 12.2 g/dL (ref 12.0–15.0)
Immature Granulocytes: 1 %
Lymphocytes Relative: 12 %
Lymphs Abs: 2.2 10*3/uL (ref 0.7–4.0)
MCH: 22.6 pg — ABNORMAL LOW (ref 26.0–34.0)
MCHC: 29.9 g/dL — ABNORMAL LOW (ref 30.0–36.0)
MCV: 75.6 fL — ABNORMAL LOW (ref 80.0–100.0)
Monocytes Absolute: 1.1 10*3/uL — ABNORMAL HIGH (ref 0.1–1.0)
Monocytes Relative: 6 %
Neutro Abs: 14.3 10*3/uL — ABNORMAL HIGH (ref 1.7–7.7)
Neutrophils Relative %: 79 %
Platelets: 345 10*3/uL (ref 150–400)
RBC: 5.4 MIL/uL — ABNORMAL HIGH (ref 3.87–5.11)
RDW: 18.8 % — ABNORMAL HIGH (ref 11.5–15.5)
WBC: 18 10*3/uL — ABNORMAL HIGH (ref 4.0–10.5)
nRBC: 0 % (ref 0.0–0.2)

## 2021-10-27 LAB — GLUCOSE, CAPILLARY
Glucose-Capillary: 258 mg/dL — ABNORMAL HIGH (ref 70–99)
Glucose-Capillary: 237 mg/dL — ABNORMAL HIGH (ref 70–99)

## 2021-10-27 LAB — BASIC METABOLIC PANEL
Anion gap: 12 (ref 5–15)
Anion gap: 10 (ref 5–15)
BUN: 55 mg/dL — ABNORMAL HIGH (ref 8–23)
BUN: 56 mg/dL — ABNORMAL HIGH (ref 8–23)
CO2: 22 mmol/L (ref 22–32)
CO2: 24 mmol/L (ref 22–32)
Calcium: 10 mg/dL (ref 8.9–10.3)
Calcium: 8.9 mg/dL (ref 8.9–10.3)
Chloride: 95 mmol/L — ABNORMAL LOW (ref 98–111)
Chloride: 99 mmol/L (ref 98–111)
Creatinine, Ser: 3.24 mg/dL — ABNORMAL HIGH (ref 0.44–1.00)
Creatinine, Ser: 2.85 mg/dL — ABNORMAL HIGH (ref 0.44–1.00)
GFR, Estimated: 15 mL/min — ABNORMAL LOW (ref >60)
GFR, Estimated: 17 mL/min — ABNORMAL LOW (ref >60)
Glucose, Bld: 355 mg/dL — ABNORMAL HIGH (ref 70–99)
Glucose, Bld: 285 mg/dL — ABNORMAL HIGH (ref 70–99)
Potassium: 6.5 mmol/L (ref 3.5–5.1)
Potassium: 5.5 mmol/L — ABNORMAL HIGH (ref 3.5–5.1)
Sodium: 129 mmol/L — ABNORMAL LOW (ref 135–145)
Sodium: 133 mmol/L — ABNORMAL LOW (ref 135–145)

## 2021-10-27 LAB — I-STAT CHEM 8, ED
BUN: 48 mg/dL — ABNORMAL HIGH (ref 8–23)
Calcium, Ion: 1.21 mmol/L (ref 1.15–1.40)
Chloride: 98 mmol/L (ref 98–111)
Creatinine, Ser: 3.4 mg/dL — ABNORMAL HIGH (ref 0.44–1.00)
Glucose, Bld: 365 mg/dL — ABNORMAL HIGH (ref 70–99)
HCT: 43 % (ref 36.0–46.0)
Hemoglobin: 14.6 g/dL (ref 12.0–15.0)
Potassium: 6.5 mmol/L (ref 3.5–5.1)
Sodium: 128 mmol/L — ABNORMAL LOW (ref 135–145)
TCO2: 25 mmol/L (ref 22–32)

## 2021-10-27 LAB — HEPATIC FUNCTION PANEL
ALT: 17 U/L (ref 0–44)
AST: 20 U/L (ref 15–41)
Albumin: 4.5 g/dL (ref 3.5–5.0)
Alkaline Phosphatase: 103 U/L (ref 38–126)
Bilirubin, Direct: 0.2 mg/dL (ref 0.0–0.2)
Indirect Bilirubin: 0.9 mg/dL (ref 0.3–0.9)
Total Bilirubin: 1.1 mg/dL (ref 0.3–1.2)
Total Protein: 9.5 g/dL — ABNORMAL HIGH (ref 6.5–8.1)

## 2021-10-27 LAB — CBG MONITORING, ED: Glucose-Capillary: 313 mg/dL — ABNORMAL HIGH (ref 70–99)

## 2021-10-27 LAB — LACTIC ACID, PLASMA: Lactic Acid, Venous: 1.2 mmol/L (ref 0.5–1.9)

## 2021-10-27 LAB — MAGNESIUM: Magnesium: 3.7 mg/dL — ABNORMAL HIGH (ref 1.7–2.4)

## 2021-10-27 MED ORDER — ONDANSETRON HCL 4 MG/2ML IJ SOLN
4.0000 mg | Freq: Four times a day (QID) | INTRAMUSCULAR | Status: DC | PRN
  Administered 2021-10-27: 4 mg via INTRAVENOUS
  Filled 2021-10-27: qty 2

## 2021-10-27 MED ORDER — FENTANYL CITRATE PF 50 MCG/ML IJ SOSY
50.0000 ug | PREFILLED_SYRINGE | Freq: Once | INTRAMUSCULAR | Status: AC
  Administered 2021-10-27: 50 ug via INTRAVENOUS
  Filled 2021-10-27: qty 1

## 2021-10-27 MED ORDER — LACTATED RINGERS IV SOLN: INTRAVENOUS | Status: DC

## 2021-10-27 MED ORDER — INSULIN ASPART 100 UNIT/ML IJ SOLN
0.0000 [IU] | INTRAMUSCULAR | Status: DC
  Administered 2021-10-27: 8 [IU] via SUBCUTANEOUS
  Administered 2021-10-27 – 2021-10-28 (×2): 5 [IU] via SUBCUTANEOUS
  Administered 2021-10-28 (×4): 3 [IU] via SUBCUTANEOUS
  Administered 2021-10-28: 5 [IU] via SUBCUTANEOUS
  Administered 2021-10-29: 3 [IU] via SUBCUTANEOUS
  Administered 2021-10-29 (×2): 2 [IU] via SUBCUTANEOUS
  Administered 2021-10-29 – 2021-10-30 (×2): 3 [IU] via SUBCUTANEOUS

## 2021-10-27 MED ORDER — ACETAMINOPHEN 650 MG RE SUPP: 650.0000 mg | Freq: Four times a day (QID) | RECTAL | Status: DC | PRN

## 2021-10-27 MED ORDER — ONDANSETRON HCL 4 MG PO TABS: 4.0000 mg | ORAL_TABLET | Freq: Four times a day (QID) | ORAL | Status: DC | PRN

## 2021-10-27 MED ORDER — ALBUTEROL SULFATE (2.5 MG/3ML) 0.083% IN NEBU
10.0000 mg | INHALATION_SOLUTION | Freq: Once | RESPIRATORY_TRACT | Status: AC
  Administered 2021-10-27: 10 mg via RESPIRATORY_TRACT
  Filled 2021-10-27: qty 12

## 2021-10-27 MED ORDER — DIPHENHYDRAMINE HCL 50 MG/ML IJ SOLN
12.5000 mg | Freq: Once | INTRAMUSCULAR | Status: AC
Start: 2021-10-27 — End: 2021-10-27
  Administered 2021-10-27: 12.5 mg via INTRAVENOUS
  Filled 2021-10-27: qty 1

## 2021-10-27 MED ORDER — PROCHLORPERAZINE EDISYLATE 10 MG/2ML IJ SOLN: 5.0000 mg | Freq: Once | INTRAMUSCULAR | Status: DC

## 2021-10-27 MED ORDER — SODIUM CHLORIDE 0.9 % IV BOLUS
1000.0000 mL | Freq: Once | INTRAVENOUS | Status: AC
  Administered 2021-10-27: 1000 mL via INTRAVENOUS

## 2021-10-27 MED ORDER — LACTATED RINGERS IV BOLUS
2000.0000 mL | Freq: Once | INTRAVENOUS | Status: AC
Start: 2021-10-27 — End: 2021-10-27
  Administered 2021-10-27: 2000 mL via INTRAVENOUS

## 2021-10-27 MED ORDER — FUROSEMIDE 10 MG/ML IJ SOLN
40.0000 mg | Freq: Once | INTRAMUSCULAR | Status: AC
  Administered 2021-10-27: 40 mg via INTRAVENOUS
  Filled 2021-10-27: qty 4

## 2021-10-27 MED ORDER — INSULIN ASPART 100 UNIT/ML IV SOLN
5.0000 [IU] | Freq: Once | INTRAVENOUS | Status: AC
  Administered 2021-10-27: 5 [IU] via INTRAVENOUS
  Filled 2021-10-27: qty 0.05

## 2021-10-27 MED ORDER — INSULIN GLARGINE-YFGN 100 UNIT/ML ~~LOC~~ SOLN
55.0000 [IU] | Freq: Two times a day (BID) | SUBCUTANEOUS | Status: DC
  Administered 2021-10-27 – 2021-10-30 (×6): 55 [IU] via SUBCUTANEOUS
  Filled 2021-10-27 (×7): qty 0.55

## 2021-10-27 MED ORDER — METOCLOPRAMIDE HCL 5 MG/ML IJ SOLN
10.0000 mg | Freq: Once | INTRAMUSCULAR | Status: AC
  Administered 2021-10-27: 10 mg via INTRAVENOUS
  Filled 2021-10-27: qty 2

## 2021-10-27 MED ORDER — SODIUM CHLORIDE 0.9 % IV SOLN: INTRAVENOUS | Status: DC

## 2021-10-27 MED ORDER — IOHEXOL 9 MG/ML PO SOLN
500.0000 mL | ORAL | Status: AC
  Administered 2021-10-27 (×2): 500 mL via ORAL

## 2021-10-27 MED ORDER — IOHEXOL 9 MG/ML PO SOLN
ORAL | Status: AC
  Filled 2021-10-27: qty 1000

## 2021-10-27 MED ORDER — PROCHLORPERAZINE EDISYLATE 10 MG/2ML IJ SOLN
5.0000 mg | Freq: Once | INTRAMUSCULAR | Status: AC | PRN
  Administered 2021-10-27: 5 mg via INTRAVENOUS
  Filled 2021-10-27: qty 2

## 2021-10-27 MED ORDER — SODIUM ZIRCONIUM CYCLOSILICATE 10 G PO PACK
10.0000 g | PACK | Freq: Once | ORAL | Status: AC
  Administered 2021-10-27: 10 g via ORAL
  Filled 2021-10-27: qty 1

## 2021-10-27 MED ORDER — CALCIUM GLUCONATE-NACL 1-0.675 GM/50ML-% IV SOLN
1.0000 g | Freq: Once | INTRAVENOUS | Status: AC
  Administered 2021-10-27: 1000 mg via INTRAVENOUS
  Filled 2021-10-27: qty 50

## 2021-10-27 MED ORDER — ONDANSETRON HCL 4 MG/2ML IJ SOLN
4.0000 mg | Freq: Once | INTRAMUSCULAR | Status: AC
  Administered 2021-10-27: 4 mg via INTRAVENOUS
  Filled 2021-10-27: qty 2

## 2021-10-27 MED ORDER — ACETAMINOPHEN 325 MG PO TABS
650.0000 mg | ORAL_TABLET | Freq: Four times a day (QID) | ORAL | Status: DC | PRN
  Administered 2021-10-29: 650 mg via ORAL
  Filled 2021-10-27: qty 2

## 2021-10-27 MED ORDER — FENTANYL CITRATE PF 50 MCG/ML IJ SOSY
50.0000 ug | PREFILLED_SYRINGE | INTRAMUSCULAR | Status: DC | PRN
  Administered 2021-10-27: 50 ug via INTRAVENOUS
  Filled 2021-10-27: qty 1

## 2021-10-27 NOTE — Consult Note (Addendum)
Renal Service ?Consult Note ?[facility] ? ?Dani Gobble ?10/27/2021 ?[name], MD ?Requesting Physician: Dr. Olevia Bowens ? ?Reason for Consult: Renal failure ?HPI: The patient is a 71 y.o. year-old w/ hx of CKD III, CAD/ MI/ sp AICD, CHF, chronic pain, DM2, gout, HL, HTN, NICM, hypothyroid, chronic home O2, OSA, sp PPM who presented w/ abdominal pain today to the ED.  Not urinating much, +nausea and emesis x last 2-3 days. InED BP 130/ 64 and temp wnl, 94% on RA. WBC 18k, Hb 12, K+ 6.5 and creat 3.2 (b/l 1.8). CT abd showed a SBO, no other issues. Asked to see for renal failure.  ? ?Pt seen in ED. States has has "many episodes" of SBO in the past. Pt l/w her dtr, SIL and grandkids. No debility, cares for herself at home. Dec'd UOP last 2 days. F/b Dr Royce Macadamia at [facility] for CKD.   ? ?ROS - denies CP, no joint pain, no HA, no blurry vision, no rash, no diarrhea ? ?Past Medical History  ?Past Medical History:  ?Diagnosis Date  ? AICD (automatic cardioverter/defibrillator) present   ? high RV threshold chronically, device was turned off in 2014; Device battery has been dead x 7 years; "turned it back on 06/22/2018"  ? Anemia, iron deficiency   ? Angioedema   ? felt to likely be due to ace inhibitors but says she has had this even off of medicines, appears to be tolerating ARBs chronically  ? Anxiety   ? Arthritis   ? "hands, legs, arms; bad in my back" (06/22/2018)  ? Asthmatic bronchitis with status asthmaticus   ? Bradycardia   ? CHF (congestive heart failure) ([place])   ? Chronic lower back pain   ? Chronic pain   ? Chronic renal insufficiency   ? CKD (chronic kidney disease), stage III ([place])   ? Coronary artery disease   ? Mild nonobstructive (30% LAD, 30% RCA) by 09/01/16 cath at [facility]  ? Degenerative joint disease   ? Depression   ? Diabetes mellitus, type 2 ([place])   ? Diabetic peripheral neuropathy ([place])   ? Dizziness   ? Dyspnea on exertion   ? Family history of adverse reaction to anesthesia    ? Mother has nausea  ? GERD (gastroesophageal reflux disease)   ? Gout   ? Heart valve disorder   ? History of blood transfusion 1981; ~ 2005; 12/2016  ? "childbirth; defibrillator OR; colostomy OR"  ? History of mononucleosis 03/2014  ? Hyperlipidemia   ? Hypertension   ? Hypothyroid   ? Insomnia   ? Intervertebral disc degeneration   ? Ischemic cardiomyopathy   ? LBBB (left bundle branch block)   ? Myocardial infarction [facility]) dx'd ~ 2005  ? Nonischemic cardiomyopathy ([place])   ? s/p ICD in 2005. EF has since recovered  ? Obesity   ? On home oxygen therapy   ? "2L at night" (06/22/2018)  ? OSA on CPAP   ? Pneumonia   ? "couple times; last time was 12/2016" (06/22/2018)  ? PONV (postoperative nausea and vomiting)   ? Presence of permanent cardiac pacemaker   ? [place]  ? Swelling   ? Syncope   ? Systemic hypertension   ? Vitamin D deficiency   ? ?Past Surgical History  ?Past Surgical History:  ?Procedure Laterality Date  ? APPENDECTOMY  07/13/2017  ? BLADDER SUSPENSION  1990  ? "w/hysterectomy"  ? BOWEL RESECTION N/A 07/09/2018  ? Procedure:  SMALL BOWEL RESECTION;  Surgeon: Judeth Horn, MD;  Location: [place];  Service: General;  Laterality: N/A;  ? CARDIAC CATHETERIZATION  2005  ? no obstructive CAD per patient  ? CARDIAC CATHETERIZATION  2018  ? CARDIAC DEFIBRILLATOR PLACEMENT  2005  ? BiV ICD implanted,  LV lead is an epicardial lead  ? CARPAL TUNNEL RELEASE Left 07/25/2014  ? Dr.Williamson   ? CARPAL TUNNEL RELEASE Right 04/08/2019  ? Procedure: RIGHT CARPAL TUNNEL RELEASE ENDOSCOPIC;  Surgeon: Milly Jakob, MD;  Location: [place];  Service: Orthopedics;  Laterality: Right;  ? CATARACT EXTRACTION W/ INTRAOCULAR LENS  IMPLANT, BILATERAL Bilateral   ? COLONIC STENT PLACEMENT N/A 08/31/2017  ? Procedure: COLONIC STENT PLACEMENT;  Surgeon: Carol Ada, MD;  Location: [place];  Service: Endoscopy;  Laterality: N/A;  ? COLONOSCOPY    ? 2010-2011 Dr.Kipreos   ? COLOSTOMY  12/2016  ? Archie Endo 01/26/2017  ?  COLOSTOMY REVERSAL N/A 07/13/2017  ? Procedure: COLOSTOMY REVERSAL;  Surgeon: Judeth Horn, MD;  Location: [place];  Service: General;  Laterality: N/A;  ? CYST REMOVAL HAND Right 05/2013  ? thumb  ? DILATION AND CURETTAGE OF UTERUS  X 5-6  ? ESOPHAGOGASTRODUODENOSCOPY (EGD) WITH PROPOFOL N/A 03/13/2020  ? Procedure: ESOPHAGOGASTRODUODENOSCOPY (EGD) WITH PROPOFOL;  Surgeon: Carol Ada, MD;  [facility];  Service: Endoscopy;  Laterality: N/A;  ? EXCISION MASS ABDOMINAL N/A 07/09/2018  ? Procedure: EXPLORATION OF  ABDOMINAL WOUND ERAS PATHWAY;  Surgeon: Judeth Horn, MD;  Location: [place];  Service: General;  Laterality: N/A;  ? FLEXIBLE SIGMOIDOSCOPY N/A 08/24/2017  ? Procedure: FLEXIBLE SIGMOIDOSCOPY;  Surgeon: Carol Ada, MD;  Location: [place];  Service: Endoscopy;  Laterality: N/A;  ? FLEXIBLE SIGMOIDOSCOPY N/A 08/31/2017  ? Procedure: FLEXIBLE SIGMOIDOSCOPY;  Surgeon: Carol Ada, MD;  Location: [place];  Service: Endoscopy;  Laterality: N/A;  stent placement  ? FLEXIBLE SIGMOIDOSCOPY N/A 03/13/2020  ? Procedure: FLEXIBLE SIGMOIDOSCOPY;  Surgeon: Carol Ada, MD;  [facility];  Service: Endoscopy;  Laterality: N/A;  ? FLEXIBLE SIGMOIDOSCOPY N/A 12/18/2020  ? Procedure: FLEXIBLE SIGMOIDOSCOPY;  Surgeon: Carol Ada, MD;  Location[facility];  Service: Endoscopy;  Laterality: N/A;  ? HEMOSTASIS CLIP PLACEMENT  03/13/2020  ? Procedure: HEMOSTASIS CLIP PLACEMENT;  Surgeon: Carol Ada, MD;  [facility];  Service: Endoscopy;;  ? HEMOSTASIS CLIP PLACEMENT  12/18/2020  ? Procedure: HEMOSTASIS CLIP PLACEMENT;  Surgeon: Carol Ada, MD;  [facility];  Service: Endoscopy;;  ? HOT HEMOSTASIS N/A 03/13/2020  ? Procedure: HOT HEMOSTASIS (ARGON PLASMA COAGULATION/BICAP);  Surgeon: Carol Ada, MD;  Location[facility];  Service: Endoscopy;  Laterality: N/A;  ? HOT HEMOSTASIS N/A 12/18/2020  ? Procedure: HOT HEMOSTASIS (ARGON PLASMA COAGULATION/BICAP);  Surgeon:  Carol Ada, MD;  [facility];  Service: Endoscopy;  Laterality: N/A;  ? IMPLANTABLE CARDIOVERTER DEFIBRILLATOR GENERATOR CHANGE  2008  ? INSERTION OF MESH N/A 07/09/2018  ? Procedure: INSERTION OF VICRYL MESH;  Surgeon: Judeth Horn, MD;  Location: [place];  Service: General;  Laterality: N/A;  ? LAPAROTOMY N/A 09/18/2017  ? Procedure: EXPLORATORY LAPAROTOMY;  Surgeon: Judeth Horn, MD;  Location: [place];  Service: General;  Laterality: N/A;  ? LEAD REVISION  06/22/2018  ? LEAD REVISION/REPAIR N/A 06/22/2018  ? Procedure: LEAD REVISION/REPAIR;  Surgeon: Deboraha Sprang, MD;  Location: [place] [facility];  Service: Cardiovascular;  Laterality: N/A;  ? LYSIS OF ADHESION N/A 09/18/2017  ? Procedure: LYSIS OF ADHESION;  Surgeon: Judeth Horn, MD;  [facility];  Service: General;  Laterality: N/A;  ? LYSIS OF ADHESION N/A 07/09/2018  ? Procedure: LYSIS OF ADHESION;  Surgeon: Judeth Horn, MD;  Location: [place];  Service: General;  Laterality: N/A;  ? PARTIAL COLECTOMY N/A 09/18/2017  ? Procedure: ILEOCOLECTOMY;  Surgeon: Judeth Horn, MD;  Location: [place];  Service: General;  Laterality: N/A;  ? PARTIAL COLECTOMY  09/25/2017  ? POLYPECTOMY  03/13/2020  ? Procedure: POLYPECTOMY;  Surgeon: Carol Ada, MD;  [facility];  Service: Endoscopy;;  ? RIGHT/LEFT HEART CATH AND CORONARY ANGIOGRAPHY N/A 06/01/2018  ? Procedure: RIGHT/LEFT HEART CATH AND CORONARY ANGIOGRAPHY;  Surgeon: Jettie Booze, MD;  Location: [place] [facility];  Service: Cardiovascular;  Laterality: N/A;  ? SIGMOIDOSCOPY N/A 09/18/2017  ? Procedure: SIGMOIDOSCOPY;  Surgeon: Judeth Horn, MD;  Location: [place];  Service: General;  Laterality: N/A;  ? SMALL INTESTINE SURGERY  07/09/2018  ? EXPLORATION OF  ABDOMINAL WOUND ERAS PATHWAYN/; MESH; LYSIS OF ADHESIONS  ? TOTAL ABDOMINAL HYSTERECTOMY  1990  ? TRANSFORAMINAL LUMBAR INTERBODY FUSION (TLIF) WITH PEDICLE SCREW FIXATION 2 LEVEL N/A 01/07/2020  ? Procedure: Lumbar Four-Five and  Lumbar Five-Sacral One open lumbar decompression and transforaminal lumbar interbody fusion;  Surgeon: Judith Part, MD;  Location: [place];  Service: Neurosurgery;  Laterality: N/A;  Lumbar Four-Five an

## 2021-10-27 NOTE — Progress Notes (Signed)
Inpatient Diabetes Program Recommendations ? ?AACE/ADA: New Consensus Statement on Inpatient Glycemic Control (2015) ? ?Target Ranges:  Prepandial:   less than 140 mg/dL ?     Peak postprandial:   less than 180 mg/dL (1-2 hours) ?     Critically ill patients:  140 - 180 mg/dL  ? ?Lab Results  ?Component Value Date  ? GLUCAP 313 (H) 10/27/2021  ? HGBA1C 9.1 (H) 01/03/2020  ? ? ?Review of Glycemic Control ? ?Diabetes history: DM2 ?Outpatient Diabetes medications: U-500 130 units TID, Farxiga 10 QD ?Current orders for Inpatient glycemic control: None - received Novolog 5 units 1x dose at 1015 today ? ?Needs basal/bolus or IV insulin to avoid DKA or HHNK. ? ?Inpatient Diabetes Program Recommendations:   ? ?IV insulin per EndoTool for hyperglycemia ? ?If basal/bolus insulin is preferred, ?Semglee 55 units BID ?Novolog 0-15 Q4H while PO ? ?When eating, would likely prefer U-500 insulin at a reduced dose. ? ?Please order HgbA1C to assess glycemic control prior to admission ? ?Will continue to follow. ? ?Thank you. ?Lorenda Peck, RD, LDN, CDE ?Inpatient Diabetes Coordinator ?[phone]  ? ? ? ? ?

## 2021-10-27 NOTE — Consult Note (Addendum)
? ? ? ? ?Consult Note ? ?Crystal [name] ?12-17-1950  ?[id number].   ? ?Requesting MD: Dr. Armandina Gemma ?Chief Complaint/Reason for Consult: SBO ? ?HPI:  ?71 year old female with past medical history significant for CHF, CAD, type II DM, GERD, hypertension, hyperlipidemia, presence of AICD, CKD stage III, chronic pain who presented to the [facility], ED via EMS due to constipation and abdominal pain. She states she has not had a BM since Saturday despite use of laxatives. She has had associated nausea and emesis. She has hisotory of multiple abdominal surgeries as below.  Currently abdominal pain is stable and improved with pain medications.  No current nausea.  She has received IV pain and nausea medications.  She is also had difficulty urinating and Foley catheter placed in the ED.  She denies recent fever or chills. She is not passing flatus. She has had noticeable ventral hernias since 2019 and states they do not normally bother her much, she has some intermittent pain from smaller hernia on occasion.  ? ?Work up in ED significant for patient afebrile, VSS. ?CT scan showing: Small-bowel obstruction with a transition point identified at a right ventral abdominal wall hernia. There is no pneumatosis or portal venous gas. An additional bowel containing hernia is noted to the left of midline without evidence of obstruction at this site.  ?Patient being admitted to the hospitalist service and general surgery asked to see in regard to SBO. ? ?Surgical history ?At outside hospital she underwent colectomy of proximal transverse colon to sigmoid colon with colostomy creation for severe colitis likely secondary to C. difficile in 2018.  She was seen by [facility] Surgery Dr. Hulen Skains 06/2017 for colostomy reversal and had incidental appendectomy at that time.  In 08/2017 she underwent ex lap, ileocecectomy, lysis of adhesions for management of colonic stricture with obstruction.  Following this she developed a nonhealing  incisional wound and was taken back to the OR 06/2018 by Dr. Hulen Skains for exploration of wound, small bowel resection, insertion of Vicryl mesh.  Since her last surgery in 2019 she has not had any further bowel obstructions requiring hospital admission but has had severe episodes of constipation managed at home with decreased oral intake and ice packs with resolution at home. ? ?She is not on anticoagulation.  She follows up with her PCP Dr. Joylene Draft regularly.  Last office visit within the last month she had creatinine of 1.8.  Last hemoglobin A1c around 8. ? ? ?ROS: ?Review of Systems  ?Constitutional:  Negative for chills and fever.  ?Respiratory:  Negative for cough and shortness of breath.   ?Cardiovascular:  Negative for chest pain and leg swelling.  ?Gastrointestinal:  Positive for abdominal pain, constipation, nausea and vomiting. Negative for diarrhea.  ?Genitourinary:   ?     Difficulty urinating  ? ?Family History  ?Problem Relation Age of Onset  ? Hypertension Father   ? Heart disease Father   ? Cancer Father   ?     prostate  ? Heart disease Other   ?     (Maternal side) Ischemic heart disease  ? Diabetes Mellitus II Other   ? Arrhythmia Mother   ? Diabetes Mellitus II Mother   ?     Borderline DM  ? Hypertension Mother   ? Asthma Mother   ? Cancer Brother   ? Dementia Brother   ? Hypertension Brother   ? Hypertension Daughter   ? Hypertension Daughter   ? Diabetes Daughter   ?  Hypertension Daughter   ? Atrial fibrillation Daughter   ? GER disease Daughter   ? Hypertension Son   ? Anxiety disorder Son   ? Hypothyroidism Son   ? Breast cancer Maternal Grandmother   ?     in her 63's  ? ? ?Past Medical History:  ?Diagnosis Date  ? AICD (automatic cardioverter/defibrillator) present   ? high RV threshold chronically, device was turned off in 2014; Device battery has been dead x 7 years; "turned it back on 06/22/2018"  ? Anemia, iron deficiency   ? Angioedema   ? felt to likely be due to ace inhibitors but says  she has had this even off of medicines, appears to be tolerating ARBs chronically  ? Anxiety   ? Arthritis   ? "hands, legs, arms; bad in my back" (06/22/2018)  ? Asthmatic bronchitis with status asthmaticus   ? Bradycardia   ? CHF (congestive heart failure) ([place])   ? Chronic lower back pain   ? Chronic pain   ? Chronic renal insufficiency   ? CKD (chronic kidney disease), stage III ([place])   ? Coronary artery disease   ? Mild nonobstructive (30% LAD, 30% RCA) by 09/01/16 cath at [facility]  ? Degenerative joint disease   ? Depression   ? Diabetes mellitus, type 2 ([place])   ? Diabetic peripheral neuropathy ([place])   ? Dizziness   ? Dyspnea on exertion   ? Family history of adverse reaction to anesthesia   ? Mother has nausea  ? GERD (gastroesophageal reflux disease)   ? Gout   ? Heart valve disorder   ? History of blood transfusion 1981; ~ 2005; 12/2016  ? "childbirth; defibrillator OR; colostomy OR"  ? History of mononucleosis 03/2014  ? Hyperlipidemia   ? Hypertension   ? Hypothyroid   ? Insomnia   ? Intervertebral disc degeneration   ? Ischemic cardiomyopathy   ? LBBB (left bundle branch block)   ? Myocardial infarction [facility]) dx'd ~ 2005  ? Nonischemic cardiomyopathy ([place])   ? s/p ICD in 2005. EF has since recovered  ? Obesity   ? On home oxygen therapy   ? "2L at night" (06/22/2018)  ? OSA on CPAP   ? Pneumonia   ? "couple times; last time was 12/2016" (06/22/2018)  ? PONV (postoperative nausea and vomiting)   ? Presence of permanent cardiac pacemaker   ? [place]  ? Swelling   ? Syncope   ? Systemic hypertension   ? Vitamin D deficiency   ? ? ?Past Surgical History:  ?Procedure Laterality Date  ? APPENDECTOMY  07/13/2017  ? BLADDER SUSPENSION  1990  ? "w/hysterectomy"  ? BOWEL RESECTION N/A 07/09/2018  ? Procedure: SMALL BOWEL RESECTION;  Surgeon: Judeth Horn, MD;  Location: [place];  Service: General;  Laterality: N/A;  ? CARDIAC CATHETERIZATION  2005  ? no obstructive CAD per patient  ? CARDIAC  CATHETERIZATION  2018  ? CARDIAC DEFIBRILLATOR PLACEMENT  2005  ? BiV ICD implanted,  LV lead is an epicardial lead  ? CARPAL TUNNEL RELEASE Left 07/25/2014  ? Dr.Williamson   ? CARPAL TUNNEL RELEASE Right 04/08/2019  ? Procedure: RIGHT CARPAL TUNNEL RELEASE ENDOSCOPIC;  Surgeon: Milly Jakob, MD;  Location: [place];  Service: Orthopedics;  Laterality: Right;  ? CATARACT EXTRACTION W/ INTRAOCULAR LENS  IMPLANT, BILATERAL Bilateral   ? COLONIC STENT PLACEMENT N/A 08/31/2017  ? Procedure: COLONIC STENT PLACEMENT;  Surgeon: Carol Ada, MD;  Location: [place];  Service: Endoscopy;  Laterality: N/A;  ? COLONOSCOPY    ? 2010-2011 Dr.Kipreos   ? COLOSTOMY  12/2016  ? Archie Endo 01/26/2017  ? COLOSTOMY REVERSAL N/A 07/13/2017  ? Procedure: COLOSTOMY REVERSAL;  Surgeon: Judeth Horn, MD;  Location: [place];  Service: General;  Laterality: N/A;  ? CYST REMOVAL HAND Right 05/2013  ? thumb  ? DILATION AND CURETTAGE OF UTERUS  X 5-6  ? ESOPHAGOGASTRODUODENOSCOPY (EGD) WITH PROPOFOL N/A 03/13/2020  ? Procedure: ESOPHAGOGASTRODUODENOSCOPY (EGD) WITH PROPOFOL;  Surgeon: Carol Ada, MD;  Location: [facility];  Service: Endoscopy;  Laterality: N/A;  ? EXCISION MASS ABDOMINAL N/A 07/09/2018  ? Procedure: EXPLORATION OF  ABDOMINAL WOUND ERAS PATHWAY;  Surgeon: Judeth Horn, MD;  Location: [place];  Service: General;  Laterality: N/A;  ? FLEXIBLE SIGMOIDOSCOPY N/A 08/24/2017  ? Procedure: FLEXIBLE SIGMOIDOSCOPY;  Surgeon: Carol Ada, MD;  Location: [place];  Service: Endoscopy;  Laterality: N/A;  ? FLEXIBLE SIGMOIDOSCOPY N/A 08/31/2017  ? Procedure: FLEXIBLE SIGMOIDOSCOPY;  Surgeon: Carol Ada, MD;  Location: [place];  Service: Endoscopy;  Laterality: N/A;  stent placement  ? FLEXIBLE SIGMOIDOSCOPY N/A 03/13/2020  ? Procedure: FLEXIBLE SIGMOIDOSCOPY;  Surgeon: Carol Ada, MD;  Location: [facility];  Service: Endoscopy;  Laterality: N/A;  ? FLEXIBLE SIGMOIDOSCOPY N/A 12/18/2020  ? Procedure: FLEXIBLE SIGMOIDOSCOPY;   Surgeon: Carol Ada, MD;  Location: [facility];  Service: Endoscopy;  Laterality: N/A;  ? HEMOSTASIS CLIP PLACEMENT  03/13/2020  ? Procedure: HEMOSTASIS CLIP PLACEMENT;  Surgeon: Carol Ada, MD;  Location: [facility]

## 2021-10-27 NOTE — Progress Notes (Signed)
Pt arrived to unit on stretcher from ED. Slid self to floor bed. VSS. NGT attached to LIWS with brown, thin drainage.  Pt denies pain/nausea at present. Foley draining c/y urine. Pt oriented to [place]bell and environment. POC discussed.  ?

## 2021-10-27 NOTE — ED Notes (Signed)
Domenic Schwab notified of patient's Chem-8 result. ?

## 2021-10-27 NOTE — ED Provider Notes (Addendum)
?[facility] DEPT ?Provider Note ? ? ?CSN: [id number] ?Arrival date & time: 10/27/21  0805 ? ?  ? ?History ? ?Chief Complaint  ?Patient presents with  ? Constipation  ? Abdominal Pain  ? ? ?Crystal [name] is a 71 y.o. female. ? ? ?Constipation ?Associated symptoms: abdominal pain   ?Abdominal Pain ?Associated symptoms: constipation   ? ?  ? ?27F with hx of CHF, prolonged Qtc, DM2, CAD, morbid obesity, HTN, SBO and contipation presenting with abdominal pain, distention/swelling, no BM since Saturday. She is not passing gas. She is worried she is having another bowel obstruction. She denies fevers or chills. Has had several episodes of nausea and vomiting, NBNB. Has an extensive hx of multiple intraabdominal surgeries. Pain is 5/10 in severity crampy and generalized. She cannot tolerate oral intake.  ? ? ?Home Medications ?Prior to Admission medications   ?Medication Sig Start Date End Date Taking? Authorizing Provider  ?allopurinol (ZYLOPRIM) 100 MG tablet Take 100 mg by mouth daily.   Yes [provider]  ?carvedilol (COREG) 25 MG tablet Take 25 mg by mouth 2 (two) times daily.    Yes [provider]  ?Cholecalciferol 50 MCG (2000 UT) CAPS Take 2,000 Units by mouth daily.   Yes [provider]  ?CRESTOR 10 MG tablet Take 1 tablet (10 mg total) by mouth at bedtime. 02/15/17  Yes Love, Ivan Anchors, PA-C  ?dapagliflozin propanediol (FARXIGA) 10 MG TABS tablet Take 10 mg by mouth daily. 05/30/21  Yes [provider]  ?DULoxetine (CYMBALTA) 60 MG capsule Take 60 mg by mouth daily.   Yes [provider]  ?febuxostat (ULORIC) 40 MG tablet Take 40 mg by mouth at bedtime.   Yes [provider]  ?gabapentin (NEURONTIN) 300 MG capsule Take 300 mg by mouth at bedtime.   Yes [provider]  ?hydrALAZINE (APRESOLINE) 25 MG tablet TAKE 1 TABLET BY MOUTH THREE TIMES DAILY ?Patient taking differently: Take 25 mg by mouth 2 (two) times daily.  09/20/21  Yes Jettie Booze, MD  ?HYDROcodone-acetaminophen (NORCO) 10-325 MG tablet Take 1 tablet by mouth 2 (two) times daily as needed. ?Patient taking differently: Take 1 tablet by mouth 2 (two) times daily as needed for moderate pain. 10/26/21  Yes Bayard Hugger, NP  ?insulin regular human CONCENTRATED (HUMULIN R U-500 KWIKPEN) 500 UNIT/ML kwikpen Inject 130 Units into the skin 3 (three) times daily with meals.   Yes [provider]  ?levothyroxine (SYNTHROID, LEVOTHROID) 88 MCG tablet Take 88 mcg by mouth daily before breakfast.  10/16/15  Yes [provider]  ?loratadine (CLARITIN) 10 MG tablet Take 10 mg by mouth daily.   Yes [provider]  ?montelukast (SINGULAIR) 10 MG tablet Take 10 mg by mouth at bedtime.   Yes [provider]  ?omega-3 acid ethyl esters (LOVAZA) 1 g capsule Take 2 g by mouth 2 (two) times daily.   Yes [provider]  ?pantoprazole (PROTONIX) 40 MG tablet Take 40 mg by mouth 2 (two) times daily.  05/23/18  Yes [provider]  ?[place] into the lungs at bedtime. CPAP with  2 liter oxygen   Yes [provider]  ?temazepam (RESTORIL) 15 MG capsule Take 1 capsule (15 mg total) by mouth at bedtime as needed for sleep. 05/07/18  Yes Gildardo Cranker, DO  ?albuterol (PROVENTIL HFA;VENTOLIN HFA) 108 (90 Base) MCG/ACT inhaler Inhale 2 puffs into the lungs every 6 (six) hours as needed for wheezing or  shortness of breath.  ?Patient not taking: Reported on 10/27/2021    [provider]  ?   ? ?Allergies    ?Ace inhibitors, Glucophage [metformin hcl], Telmisartan, Advicor [niacin-lovastatin er], Bystolic [nebivolol hcl], Erythromycin, Lipitor [atorvastatin], Lopid [gemfibrozil], Statins, Hydromorphone, Losartan, Nebivolol hcl, Other, Sacubitril-valsartan, and Spironolactone   ? ?Review of Systems   ?Review of Systems  ?Gastrointestinal:  Positive for abdominal pain and constipation.  ?All other  systems reviewed and are negative. ? ?Physical Exam ?Updated Vital Signs ?BP (!) 119/54   Pulse 89   Temp (!) 97.5 ?F (36.4 ?C)   Resp (!) 22   Ht '5\' 4"'$  (1.626 m)   Wt 110.2 kg   SpO2 97%   BMI 41.71 kg/m?  ?Physical Exam ?Vitals and nursing note reviewed.  ?Constitutional:   ?   General: She is not in acute distress. ?HENT:  ?   Head: Normocephalic and atraumatic.  ?Eyes:  ?   Conjunctiva/sclera: Conjunctivae normal.  ?   Pupils: Pupils are equal, round, and reactive to light.  ?Cardiovascular:  ?   Rate and Rhythm: Normal rate and regular rhythm.  ?Pulmonary:  ?   Effort: Pulmonary effort is normal. No respiratory distress.  ?   Breath sounds: Normal breath sounds.  ?Abdominal:  ?   General: There is distension.  ?   Tenderness: There is generalized abdominal tenderness. There is guarding.  ?Musculoskeletal:     ?   General: No deformity or signs of injury.  ?   Cervical back: Neck supple.  ?Skin: ?   Findings: No lesion or rash.  ?Neurological:  ?   General: No focal deficit present.  ?   Mental Status: She is alert. Mental status is at baseline.  ? ? ?ED Results / Procedures / Treatments   ?Labs ?(all labs ordered are listed, but only abnormal results are displayed) ?Labs Reviewed  ?CBC WITH DIFFERENTIAL/PLATELET - Abnormal; Notable for the following components:  ?    Result Value  ? WBC 18.0 (*)   ? RBC 5.40 (*)   ? MCV 75.6 (*)   ? MCH 22.6 (*)   ? MCHC 29.9 (*)   ? RDW 18.8 (*)   ? Neutro Abs 14.3 (*)   ? Monocytes Absolute 1.1 (*)   ? Abs Immature Granulocytes 0.10 (*)   ? All other components within normal limits  ?HEPATIC FUNCTION PANEL - Abnormal; Notable for the following components:  ? Total Protein 9.5 (*)   ? All other components within normal limits  ?BASIC METABOLIC PANEL - Abnormal; Notable for the following components:  ? Sodium 129 (*)   ? Potassium 6.5 (*)   ? Chloride 95 (*)   ? Glucose, Bld 355 (*)   ? BUN 55 (*)   ? Creatinine, Ser 3.24 (*)   ? GFR, Estimated 15 (*)   ? All other  components within normal limits  ?POTASSIUM - Abnormal; Notable for the following components:  ? Potassium 6.5 (*)   ? All other components within normal limits  ?POTASSIUM - Abnormal; Notable for the following components:  ? Potassium 5.3 (*)   ? All other components within normal limits  ?MAGNESIUM - Abnormal; Notable for the following components:  ? Magnesium 3.7 (*)   ? All other components within normal limits  ?I-STAT CHEM 8, ED - Abnormal; Notable for the following components:  ? Sodium 128 (*)   ? Potassium 6.5 (*)   ? BUN 48 (*)   ?  Creatinine, Ser 3.40 (*)   ? Glucose, Bld 365 (*)   ? All other components within normal limits  ?CBG MONITORING, ED - Abnormal; Notable for the following components:  ? Glucose-Capillary 313 (*)   ? All other components within normal limits  ?RESP PANEL BY RT-PCR (FLU A&B, COVID) ARPGX2  ?LIPASE, BLOOD  ?LACTIC ACID, PLASMA  ?URINALYSIS, ROUTINE W REFLEX MICROSCOPIC  ?POTASSIUM  ?POTASSIUM  ?BASIC METABOLIC PANEL  ?HEMOGLOBIN A1C  ? ? ?EKG ?EKG Interpretation ? ?Date/Time:  Wednesday October 27 2021 09:06:38 EDT ?Ventricular Rate:  74 ?PR Interval:  121 ?QRS Duration: 170 ?QT Interval:  507 ?QTC Calculation: 563 ?R Axis:   -33 ?Text Interpretation: Sinus rhythm Nonspecific IVCD with LAD Left ventricular hypertrophy Inferior infarct, acute (RCA) Anterior infarct, old Lateral leads are also involved Probable RV involvement, suggest recording right precordial leads Peaked T waves present Paced rhythm Reconfirmed by Regan Lemming ([id number]) on 10/27/2021 3:50:38 PM ? ?Radiology ?CT ABDOMEN PELVIS WO CONTRAST ? ?Result Date: 10/27/2021 ?CLINICAL DATA:  Constipation, upper abdominal pain, history of bowel obstruction and hernias EXAM: CT ABDOMEN AND PELVIS WITHOUT CONTRAST TECHNIQUE: Multidetector CT imaging of the abdomen and pelvis was performed following the standard protocol without IV contrast. RADIATION DOSE REDUCTION: This exam was performed according to the departmental  dose-optimization program which includes automated exposure control, adjustment of the mA and/or kV according to patient size and/or use of iterative reconstruction technique. COMPARISON:  CT abdomen/pelvis 08/17/2019 FINDINGS: [place]

## 2021-10-27 NOTE — H&P (Addendum)
?History and Physical  ? ? ?Patient: Crystal [name] WHQ:[id number] DOB: 01/18/1951 ?DOA: 10/27/2021 ?DOS: the patient was seen and examined on 10/27/2021 ?PCP: Crist Infante, MD  ?Patient coming from: Home ? ?Chief Complaint:  ?Chief Complaint  ?Patient presents with  ? Constipation  ? Abdominal Pain  ? ?HPI: Crystal [name] is a 70 y.o. female with medical history significant of CAD, ischemic cardiomyopathy, history of resolved systolic CHF, bradycardia, AICD placement, angioedema, anxiety, depression, osteoarthritis, asthmatic bronchitis, chronic lower back pain, type II DM, diabetic peripheral neuropathy, GERD, gout, mononucleosis, hyperlipidemia, hypertension, hypothyroidism, OSA on CPAP, history of syncope, vitamin D deficiency, below extensive past surgical history, multiple SBO episodes who is coming to the emergency department complaints of abdominal pain since Sunday associated with constipation for a week, worsened by abdominal distention, nausea and 2 episodes of emesis since earlier today.  She has been urinating less than usual.  No melena or hematochezia.  No flank pain, hematuria or dysuria.  She denied fever, chills, sore throat, rhinorrhea, productive cough, wheezing or hemoptysis.  No chest pain, palpitations, diaphoresis, PND, orthopnea or pitting edema of the lower extremities. ? ?ED course: Initial vital signs were temperature 97.5 ?F, pulse 78, respiration 20, BP 130/64 mmHg O2 sat 94% on room air.  The patient received 1000 mL of NS bolus, metoclopramide 10 mg IVP, Lokelma 10 g p.o. x1, NovoLog 5 units IVP x1, calcium gluconate 1 g IVPB, albuterol 10 mg neb x1 and fentanyl 50 mcg IVP x2.  I added ondansetron 4 mg IVP x1 and 2000 mL of LR bolus over 3 hours. ? ?Lab work: CBC is her white count of 18.0, hemoglobin 12.2 g/dL and platelets 345.  Lactic acid was normal.  LFTs were unremarkable except for a total protein of 9.5 g/dL.  Lipase within expected range.  BMP showed a sodium 129, potassium 6.5,  chloride 95 and CO2 22 mmol/L.  Glucose 355, BUN 55 and creatinine 3.24 mg/dL.  Her most recent baseline creatinine was 1.81 mg/dL. ? ?Imaging: A portable 1 view chest radiograph did not show any active disease.  CT abdomen/pelvis without contrast showed a small bowel obstruction with a transition point identified high right ventral abdominal wall hernia.  There is no pneumatosis or portal venous gas.  There is an additional bowel containing hernia which is noted to the left of midline without evidence of obstruction at this site.  Small volume free fluid throughout the abdomen which is likely reactive.  Postop changes and L4-S1.  There was aortic atherosclerosis.  Please see images and full radiology report for further details. ?  ?Review of Systems: As mentioned in the history of present illness. All other systems reviewed and are negative. ?Past Medical History:  ?Diagnosis Date  ? AICD (automatic cardioverter/defibrillator) present   ? high RV threshold chronically, device was turned off in 2014; Device battery has been dead x 7 years; "turned it back on 06/22/2018"  ? Anemia, iron deficiency   ? Angioedema   ? felt to likely be due to ace inhibitors but says she has had this even off of medicines, appears to be tolerating ARBs chronically  ? Anxiety   ? Arthritis   ? "hands, legs, arms; bad in my back" (06/22/2018)  ? Asthmatic bronchitis with status asthmaticus   ? Bradycardia   ? CHF (congestive heart failure) ([place])   ? Chronic lower back pain   ? Chronic pain   ? Chronic renal insufficiency   ? CKD (chronic  kidney disease), stage III ([place])   ? Coronary artery disease   ? Mild nonobstructive (30% LAD, 30% RCA) by 09/01/16 cath at [facility]  ? Degenerative joint disease   ? Depression   ? Diabetes mellitus, type 2 ([place])   ? Diabetic peripheral neuropathy ([place])   ? Dizziness   ? Dyspnea on exertion   ? Family history of adverse reaction to anesthesia   ? Mother has nausea  ? GERD (gastroesophageal reflux  disease)   ? Gout   ? Heart valve disorder   ? History of blood transfusion 1981; ~ 2005; 12/2016  ? "childbirth; defibrillator OR; colostomy OR"  ? History of mononucleosis 03/2014  ? Hyperlipidemia   ? Hypertension   ? Hypothyroid   ? Insomnia   ? Intervertebral disc degeneration   ? Ischemic cardiomyopathy   ? LBBB (left bundle branch block)   ? Myocardial infarction [facility]) dx'd ~ 2005  ? Nonischemic cardiomyopathy ([place])   ? s/p ICD in 2005. EF has since recovered  ? Obesity   ? On home oxygen therapy   ? "2L at night" (06/22/2018)  ? OSA on CPAP   ? Pneumonia   ? "couple times; last time was 12/2016" (06/22/2018)  ? PONV (postoperative nausea and vomiting)   ? Presence of permanent cardiac pacemaker   ? [place]  ? Swelling   ? Syncope   ? Systemic hypertension   ? Vitamin D deficiency   ? ?Past Surgical History:  ?Procedure Laterality Date  ? APPENDECTOMY  07/13/2017  ? BLADDER SUSPENSION  1990  ? "w/hysterectomy"  ? BOWEL RESECTION N/A 07/09/2018  ? Procedure: SMALL BOWEL RESECTION;  Surgeon: Judeth Horn, MD;  Location: [place];  Service: General;  Laterality: N/A;  ? CARDIAC CATHETERIZATION  2005  ? no obstructive CAD per patient  ? CARDIAC CATHETERIZATION  2018  ? CARDIAC DEFIBRILLATOR PLACEMENT  2005  ? BiV ICD implanted,  LV lead is an epicardial lead  ? CARPAL TUNNEL RELEASE Left 07/25/2014  ? Dr.Williamson   ? CARPAL TUNNEL RELEASE Right 04/08/2019  ? Procedure: RIGHT CARPAL TUNNEL RELEASE ENDOSCOPIC;  Surgeon: Milly Jakob, MD;  Location: [place];  Service: Orthopedics;  Laterality: Right;  ? CATARACT EXTRACTION W/ INTRAOCULAR LENS  IMPLANT, BILATERAL Bilateral   ? COLONIC STENT PLACEMENT N/A 08/31/2017  ? Procedure: COLONIC STENT PLACEMENT;  Surgeon: Carol Ada, MD;  Location: [place];  Service: Endoscopy;  Laterality: N/A;  ? COLONOSCOPY    ? 2010-2011 Dr.[email or web address]   ? COLOSTOMY  12/2016  ? Archie Endo 01/26/2017  ? COLOSTOMY REVERSAL N/A 07/13/2017  ? Procedure: COLOSTOMY REVERSAL;  Surgeon: Judeth Horn, MD;  Location: [place];  Service: General;  Laterality: N/A;  ? CYST REMOVAL HAND Right 05/2013  ? thumb  ? DILATION AND CURETTAGE OF UTERUS  X 5-6  ? ESOPHAGOGASTRODUODENOSCOPY (EGD) WITH PROPOFOL N/A 03/13/2020  ? Procedure: ESOPHAGOGASTRODUODENOSCOPY (EGD) WITH PROPOFOL;  Surgeon: Carol Ada, MD;  Location: [facility];  Service: Endoscopy;  Laterality: N/A;  ? EXCISION MASS ABDOMINAL N/A 07/09/2018  ? Procedure: EXPLORATION OF  ABDOMINAL WOUND ERAS PATHWAY;  Surgeon: Judeth Horn, MD;  Location: [place];  Service: General;  Laterality: N/A;  ? FLEXIBLE SIGMOIDOSCOPY N/A 08/24/2017  ? Procedure: FLEXIBLE SIGMOIDOSCOPY;  Surgeon: Carol Ada, MD;  Location: [place];  Service: Endoscopy;  Laterality: N/A;  ? FLEXIBLE SIGMOIDOSCOPY N/A 08/31/2017  ? Procedure: FLEXIBLE SIGMOIDOSCOPY;  Surgeon: Carol Ada, MD;  Location: [place];  Service: Endoscopy;  Laterality: N/A;  stent placement  ? FLEXIBLE SIGMOIDOSCOPY N/A 03/13/2020  ? Procedure: FLEXIBLE SIGMOIDOSCOPY;  Surgeon: Carol Ada, MD;  Location: [facility];  Service: Endoscopy;  Laterality: N/A;  ? FLEXIBLE SIGMOIDOSCOPY N/A 12/18/2020  ? Procedure: FLEXIBLE SIGMOIDOSCOPY;  Surgeon: Carol Ada, MD;  Location: [facility];  Service: Endoscopy;  Laterality: N/A;  ? HEMOSTASIS CLIP PLACEMENT  03/13/2020  ? Procedure: HEMOSTASIS CLIP PLACEMENT;  Surgeon: Carol Ada, MD;  Location: [facility];  Service: Endoscopy;;  ? HEMOSTASIS CLIP PLACEMENT  12/18/2020  ? Procedure: HEMOSTASIS CLIP PLACEMENT;  Surgeon: Carol Ada, MD;  Location: [facility];  Service: Endoscopy;;  ? HOT HEMOSTASIS N/A 03/13/2020  ? Procedure: HOT HEMOSTASIS (ARGON PLASMA COAGULATION/BICAP);  Surgeon: Carol Ada, MD;  Location: [facility];  Service: Endoscopy;  Laterality: N/A;  ? HOT HEMOSTASIS N/A 12/18/2020  ? Procedure: HOT HEMOSTASIS (ARGON PLASMA COAGULATION/BICAP);  Surgeon: Carol Ada, MD;  Location: [facility];  Service: Endoscopy;  Laterality: N/A;  ?  IMPLANTABLE CARDIOVERTER DEFIBRILLATOR GENERATOR CHANGE  2008  ? INSERTION OF MESH N/A 07/09/2018  ? Procedure: INSERTION OF VICRYL MESH;  Surgeon: Judeth Horn, MD;  Location: [place];  Service: Kateri Mc

## 2021-10-27 NOTE — ED Triage Notes (Signed)
Pt coming from home via EMS with c/o constipation since Saturday and upper abdominal pain since Sunday night. Pt reports trying laxatives at home without relief. Hx bowel blockage, hernias, and bowel surgeries.  ?

## 2021-10-28 ENCOUNTER — Inpatient Hospital Stay (HOSPITAL_COMMUNITY): Payer: Medicare Other

## 2021-10-28 DIAGNOSIS — I10 Essential (primary) hypertension: Secondary | ICD-10-CM | POA: Diagnosis not present

## 2021-10-28 DIAGNOSIS — Z794 Long term (current) use of insulin: Secondary | ICD-10-CM

## 2021-10-28 DIAGNOSIS — E119 Type 2 diabetes mellitus without complications: Secondary | ICD-10-CM

## 2021-10-28 DIAGNOSIS — N179 Acute kidney failure, unspecified: Secondary | ICD-10-CM | POA: Diagnosis not present

## 2021-10-28 DIAGNOSIS — N1832 Chronic kidney disease, stage 3b: Secondary | ICD-10-CM

## 2021-10-28 DIAGNOSIS — K56609 Unspecified intestinal obstruction, unspecified as to partial versus complete obstruction: Secondary | ICD-10-CM | POA: Diagnosis not present

## 2021-10-28 LAB — BASIC METABOLIC PANEL
Anion gap: 10 (ref 5–15)
BUN: 56 mg/dL — ABNORMAL HIGH (ref 8–23)
CO2: 24 mmol/L (ref 22–32)
Calcium: 8.5 mg/dL — ABNORMAL LOW (ref 8.9–10.3)
Chloride: 101 mmol/L (ref 98–111)
Creatinine, Ser: 2.6 mg/dL — ABNORMAL HIGH (ref 0.44–1.00)
GFR, Estimated: 19 mL/min — ABNORMAL LOW (ref >60)
Glucose, Bld: 227 mg/dL — ABNORMAL HIGH (ref 70–99)
Potassium: 4.8 mmol/L (ref 3.5–5.1)
Sodium: 135 mmol/L (ref 135–145)

## 2021-10-28 LAB — GLUCOSE, CAPILLARY
Glucose-Capillary: 128 mg/dL — ABNORMAL HIGH (ref 70–99)
Glucose-Capillary: 158 mg/dL — ABNORMAL HIGH (ref 70–99)
Glucose-Capillary: 167 mg/dL — ABNORMAL HIGH (ref 70–99)
Glucose-Capillary: 195 mg/dL — ABNORMAL HIGH (ref 70–99)
Glucose-Capillary: 196 mg/dL — ABNORMAL HIGH (ref 70–99)
Glucose-Capillary: 218 mg/dL — ABNORMAL HIGH (ref 70–99)
Glucose-Capillary: 226 mg/dL — ABNORMAL HIGH (ref 70–99)

## 2021-10-28 LAB — CBC
HCT: 34.5 % — ABNORMAL LOW (ref 36.0–46.0)
Hemoglobin: 10.4 g/dL — ABNORMAL LOW (ref 12.0–15.0)
MCH: 23.5 pg — ABNORMAL LOW (ref 26.0–34.0)
MCHC: 30.1 g/dL (ref 30.0–36.0)
MCV: 77.9 fL — ABNORMAL LOW (ref 80.0–100.0)
Platelets: 274 10*3/uL (ref 150–400)
RBC: 4.43 MIL/uL (ref 3.87–5.11)
RDW: 17.9 % — ABNORMAL HIGH (ref 11.5–15.5)
WBC: 11.1 10*3/uL — ABNORMAL HIGH (ref 4.0–10.5)
nRBC: 0 % (ref 0.0–0.2)

## 2021-10-28 LAB — HEMOGLOBIN A1C
Hgb A1c MFr Bld: 8.5 % — ABNORMAL HIGH (ref 4.8–5.6)
Mean Plasma Glucose: 197 mg/dL

## 2021-10-28 LAB — POTASSIUM
Potassium: 5.3 mmol/L — ABNORMAL HIGH (ref 3.5–5.1)
Potassium: 5.4 mmol/L — ABNORMAL HIGH (ref 3.5–5.1)

## 2021-10-28 MED ORDER — SODIUM CHLORIDE 0.9 % IV SOLN: INTRAVENOUS | Status: DC

## 2021-10-28 MED ORDER — HYDRALAZINE HCL 20 MG/ML IJ SOLN: 10.0000 mg | INTRAMUSCULAR | Status: DC | PRN

## 2021-10-28 MED ORDER — DIATRIZOATE MEGLUMINE & SODIUM 66-10 % PO SOLN
90.0000 mL | Freq: Once | ORAL | Status: AC
  Administered 2021-10-28: 90 mL via NASOGASTRIC
  Filled 2021-10-28: qty 90

## 2021-10-28 MED ORDER — CHLORHEXIDINE GLUCONATE CLOTH 2 % EX PADS
6.0000 | MEDICATED_PAD | Freq: Every day | CUTANEOUS | Status: DC
  Administered 2021-10-28 – 2021-10-29 (×2): 6 via TOPICAL

## 2021-10-28 NOTE — Progress Notes (Signed)
[facility] ?Progress Note ? ?Subjective: feeling better, had a BM. K+ 4.8 and creat down to 2.60 ? ?Vitals:  ? 10/27/21 1701 10/27/21 2314 10/28/21 0529 10/28/21 1509  ?BP: (!) 143/60 (!) 148/65 (!) 146/60 (!) 126/56  ?Pulse: 88 96 89 86  ?Resp:  18 18   ?Temp:  99.2 ?F (37.3 ?C) 98.5 ?F (36.9 ?C) 98.2 ?F (36.8 ?C)  ?TempSrc:  Oral Oral Oral  ?SpO2: 98% 94% 100% 99%  ?Weight:      ?Height:      ? ? ?Exam: ? alert, nad , no distress, calm ? no jvd ? Chest cta bilat ? Cor reg no RG ? Abd soft ntnd no ascites ?  Ext no LE edema ?  Alert, NF, ox3 ? ?    ?    ?   UA 0-5 wbc/ rbc, 30 prot ?    RUS > 11.7 / 9.4 cm kidneys w/o hydro, ^'d echotexture bilat ?  ?  ?  ?Assessment/ Plan: ?AKI on CKD 4   -  b/l creat 1.8- 2.1 from 2021, eGFR 23- 28 ml/min.  F/b Dr Royce Macadamia at [facility]. Creat here 3.24 in setting of acute SBO, vol depletion, n/v for 2-3 days. Was not voiding much but w/ IVF"s and foley cath in ED she started to make urine. UA and renal US unremarkable. Looked dry and IVF"s were continued. AKI likely due to vol depletion and urinary retention. Improving, creat down to 2.60 today. Cont IVF"s. F/u labs in am.  ?SBO - pt is doing better, had a BM.  ?Hyperkalemia - resolved. Cont renal diet for now.  ?HTN - ok to let BP's run a little high w/ AKI, 120-150 syst is okay ? ? ? ? ?Rob Petro Talent ?10/28/2021, 5:04 PM ? ? ?Recent Labs  ?Lab 10/27/21 ?0905 10/27/21 ?0915 10/27/21 ?1009 10/27/21 ?1739 10/27/21 ?2153 10/28/21 ?0532 10/28/21 ?0809  ?HGB 12.2 14.6  --   --   --   --  10.4*  ?ALBUMIN 4.5  --   --   --   --   --   --   ?CALCIUM 10.0  --   --  8.9  --   --  8.5*  ?CREATININE 3.24* 3.40*  --  2.85*  --   --  2.60*  ?K 6.5* 6.5*   < > 5.5*   < > 5.4* 4.8  ? < > = values in this interval not displayed.  ? ?Inpatient medications: ? Chlorhexidine Gluconate Cloth  6 each Topical Daily  ? insulin aspart  0-15 Units Subcutaneous Q4H  ? insulin glargine-yfgn  55 Units Subcutaneous BID  ? ? sodium chloride 125 mL/hr at  10/28/21 1324  ? ?acetaminophen **OR** acetaminophen, fentaNYL (SUBLIMAZE) injection, hydrALAZINE, ondansetron **OR** ondansetron (ZOFRAN) IV ? ? ? ? ? ? ?

## 2021-10-28 NOTE — Progress Notes (Signed)
[facility] ? ?PROGRESS NOTE ? ?PEGGYE POON JGO:[id number] DOB: 07-28-1950 DOA: 10/27/2021 ?PCP: Crist Infante, MD ? ? ?Brief HPI:   ?71 year old female with medical history of CAD, ischemic cardiomyopathy, history of resolved systolic CHF, bradycardia, AICD placement, angioedema, anxiety, depression, osteoarthritis, history of bronchitis, chronic lower back pain, diabetes mellitus type 2, diabetic peripheral neuropathy, GERD, extensive past surgical history of multiple SBO episodes came to hospital with abdominal pain since Sunday, worsened by abdominal distention, nausea and episodes of vomiting. ?CT abdomen/pelvis showed small bowel obstruction with transition point identified high right ventral abdominal wall hernia.  No pneumatosis or portal venous gas.  No additional bowel containing WBC is noted left of midline without evidence of obstruction at the site. ?General surgery was consulted ? ? ? ?Subjective  ? ?Patient seen and examined, not passing gas, no bowel movement yet.  Denies nausea, vomiting ? ? Assessment/Plan:  ? ? ?Small bowel obstruction ?-Patient has history of recurrent SBO ?-NG tube in place with suction ?-Continue IV fluids ?-Continue antibiotics as needed ?-General surgery consulted; plan for conservative management at this time ? ?Acute kidney injury superimposed on stage III CKD ?-Patient baseline creatinine is around 1.8 to 2.0 ?-Presented with creatinine of 3.24 ?-Likely from poor p.o. intake, SBO ?-Nephrology consulted ?-Patient is on normal saline at 125 mill per hour ?-Creatinine improved to 2.60 today ?-Follow BMP in am ? ?Hyperkalemia ?-Resolved ? ?Hypermagnesemia ?-Magnesium level 3.7 ?-Follow serum magnesium in a.m. ? ?Diabetes mellitus type 2 ?-Currently n.p.o. ?-She is on high-dose or U-500 insulin at home ?-Started on sliding scale insulin with NovoLog ?-CBG well controlled ?-Hemoglobin A1c is 8.5 ? ?CAD ?-Denies chest pain or dyspnea ?-Patient takes Coreg 25 mg twice daily  at home ?-We will start once taking p.o. ? ?Hypertension ?-Patient takes Coreg 25 mg p.o. twice daily ?-Start hydralazine as needed ? ?Hypothyroidism ?-We will start Synthroid once taking p.o. ? ? ? ? ?Medications ? ?  ? Chlorhexidine Gluconate Cloth  6 each Topical Daily  ? diatrizoate meglumine-sodium  90 mL Per NG tube Once  ? insulin aspart  0-15 Units Subcutaneous Q4H  ? insulin glargine-yfgn  55 Units Subcutaneous BID  ? ? ? Data Reviewed:  ? ?CBG: ? ?Recent Labs  ?Lab 10/27/21 ?2100 10/28/21 ?5597 10/28/21 ?4163 10/28/21 ?8453 10/28/21 ?1145  ?GLUCAP 237* 226* 196* 218* 195*  ? ? ?SpO2: 100 %  ? ? ?Vitals:  ? 10/27/21 1622 10/27/21 1701 10/27/21 2314 10/28/21 0529  ?BP:  (!) 143/60 (!) 148/65 (!) 146/60  ?Pulse:  88 96 89  ?Resp:   18 18  ?Temp: 98.7 ?F (37.1 ?C)  99.2 ?F (37.3 ?C) 98.5 ?F (36.9 ?C)  ?TempSrc: Oral  Oral Oral  ?SpO2:  98% 94% 100%  ?Weight:      ?Height:      ? ? ? ? ?Data Reviewed: ? ?Basic Metabolic Panel: ?Recent Labs  ?Lab 10/27/21 ?0905 10/27/21 ?0915 10/27/21 ?1009 10/27/21 ?1409 10/27/21 ?1739 10/27/21 ?2153 10/28/21 ?0200 10/28/21 ?0532 10/28/21 ?0809  ?NA 129* 128*  --   --  133*  --   --   --  135  ?K 6.5* 6.5* 6.5*   < > 5.5* 5.5* 5.3* 5.4* 4.8  ?CL 95* 98  --   --  99  --   --   --  101  ?CO2 22  --   --   --  24  --   --   --  24  ?GLUCOSE 355*  365*  --   --  285*  --   --   --  227*  ?BUN 55* 48*  --   --  56*  --   --   --  56*  ?CREATININE 3.24* 3.40*  --   --  2.85*  --   --   --  2.60*  ?CALCIUM 10.0  --   --   --  8.9  --   --   --  8.5*  ?MG  --   --  3.7*  --   --   --   --   --   --   ? < > = values in this interval not displayed.  ? ? ?CBC: ?Recent Labs  ?Lab 10/27/21 ?0905 10/27/21 ?6599 10/28/21 ?0809  ?WBC 18.0*  --  11.1*  ?NEUTROABS 14.3*  --   --   ?HGB 12.2 14.6 10.4*  ?HCT 40.8 43.0 34.5*  ?MCV 75.6*  --  77.9*  ?PLT 345  --  274  ? ? ?LFT ?Recent Labs  ?Lab 10/27/21 ?3570  ?AST 20  ?ALT 17  ?ALKPHOS 103  ?BILITOT 1.1  ?PROT 9.5*  ?ALBUMIN 4.5  ? ?   ?Antibiotics: ?Anti-infectives (From admission, onward)  ? ? None  ? ?  ? ? ? ?DVT prophylaxis: SCDs ? ?Code Status: Full code ? ?Family Communication: No family at bedside ? ? ?CONSULTS  ? ? ?Objective  ? ? ?Physical Examination: ? ? ?General-appears in no acute distress ?Heart-S1-S2, regular, no murmur auscultated ?Lungs-clear to auscultation bilaterally, no wheezing or crackles auscultated ?Abdomen-soft, nontender, distended, absent bowel sounds, no organomegaly ?Extremities-no edema in the lower extremities ?Neuro-alert, oriented x3, no focal deficit noted ? ? ?Status is: Inpatient: Recurrent small bowel obstruction ? ? ? ?  ? ? ? ? ? ?Oswald Hillock ?  ?[facility] ?If 7PM-7AM, please contact night-coverage at [email or web address], ?Office  [phone] ? ? ?10/28/2021, 2:22 PM  LOS: 1 day  ? ? ? ? ? ? ? ? ? ? ?  ?

## 2021-10-28 NOTE — Progress Notes (Signed)
? ?Progress Note ? ?   ?Subjective: ?Feeling much better this am. Had some nausea overnight but none this morning and bloating and abdominal pain much improved. At time of my visit no flatus or BM yet ? ? ?Objective: ?Vital signs in last 24 hours: ?Temp:  [98.5 ?F (36.9 ?C)-99.2 ?F (37.3 ?C)] 98.5 ?F (36.9 ?C) (04/06 0529) ?Pulse Rate:  [79-96] 89 (04/06 0529) ?Resp:  [16-22] 18 (04/06 0529) ?BP: (119-166)/(54-92) 146/60 (04/06 0529) ?SpO2:  [88 %-100 %] 100 % (04/06 0529) ?Last BM Date : 10/22/21 ? ?Intake/Output from previous day: ?04/05 0701 - 04/06 0700 ?In: 4028.5 [I.V.:2028.5; IV Piggyback:2000] ?Out: 2500 [Urine:2050; Emesis/NG output:450] ?Intake/Output this shift: ?Total I/O ?In: 543.9 [I.V.:543.9] ?Out: 450 [Urine:450] ? ?Physical Exam: ?General: pleasant, WD, female who is laying in bed in NAD ?Lungs: Respiratory effort nonlabored on room air ?Abd: soft, distention difficult to assess given body habitus.  Palpable chronically incarcerated ventral hernias left and right of midline on lower abdomen along abdominal fold.  Both partially reducible and softer than yesterday.  No abdominal tenderness to palpation this am.  Several well-healed abdominal scars. ?MSK: all 4 extremities are symmetrical with no cyanosis, clubbing, or edema. ?Skin: warm and dry with no masses, lesions, or rashes ?Psych: A&Ox3 with an appropriate affect.  ? ? ?Lab Results:  ?Recent Labs  ?  10/27/21 ?0905 10/27/21 ?7517 10/28/21 ?0809  ?WBC 18.0*  --  11.1*  ?HGB 12.2 14.6 10.4*  ?HCT 40.8 43.0 34.5*  ?PLT 345  --  274  ? ?BMET ?Recent Labs  ?  10/27/21 ?1739 10/27/21 ?2153 10/28/21 ?0532 10/28/21 ?0809  ?NA 133*  --   --  135  ?K 5.5*   < > 5.4* 4.8  ?CL 99  --   --  101  ?CO2 24  --   --  24  ?GLUCOSE 285*  --   --  227*  ?BUN 56*  --   --  56*  ?CREATININE 2.85*  --   --  2.60*  ?CALCIUM 8.9  --   --  8.5*  ? < > = values in this interval not displayed.  ? ?PT/INR ?No results for input(s): LABPROT, INR in the last 72  hours. ?CMP  ?   ?Component Value Date/Time  ? NA 135 10/28/2021 0809  ? NA 135 10/08/2018 1259  ? K 4.8 10/28/2021 0809  ? CL 101 10/28/2021 0809  ? CO2 24 10/28/2021 0809  ? GLUCOSE 227 (H) 10/28/2021 0809  ? BUN 56 (H) 10/28/2021 0809  ? BUN 55 (H) 10/08/2018 1259  ? CREATININE 2.60 (H) 10/28/2021 0809  ? CREATININE 3.10 (H) 08/17/2017 1545  ? CALCIUM 8.5 (L) 10/28/2021 0809  ? PROT 9.5 (H) 10/27/2021 0905  ? ALBUMIN 4.5 10/27/2021 0905  ? AST 20 10/27/2021 0905  ? ALT 17 10/27/2021 0905  ? ALKPHOS 103 10/27/2021 0905  ? BILITOT 1.1 10/27/2021 0905  ? GFRNONAA 19 (L) 10/28/2021 0809  ? GFRNONAA 15 (L) 08/17/2017 1545  ? GFRAA 32 (L) 01/08/2020 0618  ? GFRAA 17 (L) 08/17/2017 1545  ? ?Lipase  ?   ?Component Value Date/Time  ? LIPASE 27 10/27/2021 0905  ? ? ? ? ? ?Studies/Results: ?CT ABDOMEN PELVIS WO CONTRAST ? ?Result Date: 10/27/2021 ?CLINICAL DATA:  Constipation, upper abdominal pain, history of bowel obstruction and hernias EXAM: CT ABDOMEN AND PELVIS WITHOUT CONTRAST TECHNIQUE: Multidetector CT imaging of the abdomen and pelvis was performed following the standard protocol without IV contrast. RADIATION DOSE  REDUCTION: This exam was performed according to the departmental dose-optimization program which includes automated exposure control, adjustment of the mA and/or kV according to patient size and/or use of iterative reconstruction technique. COMPARISON:  CT abdomen/pelvis 08/17/2019 FINDINGS: Lower chest: Patchy opacities in the lung bases are favored to reflect subsegmental atelectasis. Cardiac device leads are partially imaged. Hepatobiliary: The liver is mildly enlarged. The liver is otherwise unremarkable, within the confines of noncontrast technique. The gallbladder is unremarkable. There is no biliary ductal dilatation. Pancreas: Unremarkable. Spleen: Unremarkable. 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 The left adrenal is unremarkable. The kidneys are unremarkable, with no focal lesion, stone, hydronephrosis, or hydroureter. The bladder is unremarkable. Stomach/Bowel: The stomach is markedly distended. The small bowel is diffusely dilated with the lumen measuring up to 4.3 cm. There is a left ventral abdominal hernia measuring approximately 4.2 cm across containing a dilated loop of small bowel. Both the efferent and afferent loops of bowel associated with this hernia are dilated. There is an additional hernia more inferiorly and to the right measuring approximately 4.1 cm transverse. There is dilated bowel going into this hernia with fecalized material, and the efferent loop is decompressed, consistent with the transition point (5-60). The bowel beyond this point is decompressed. There is no pneumatosis intestinalis. Postsurgical changes are noted reflecting partial large bowel resection. There is no evidence of complication at the anastomotic site. Vascular/Lymphatic: There is calcified atherosclerotic plaque throughout the nonaneurysmal abdominal aorta. There is no portal venous gas. There is no abdominopelvic lymphadenopathy. Reproductive: Uterus is surgically absent. There is no adnexal mass. Other: There is scattered small volume free fluid in the abdomen. There is no free intraperitoneal air. Musculoskeletal: Postsurgical changes reflecting L4 through S1 posterior instrumented fusion are noted. There is lucency around the bilateral S1 screws raising suspicion for loosening. IMPRESSION: 1. Small-bowel obstruction with a transition point identified at a right ventral abdominal wall hernia. There is no pneumatosis or portal venous gas. An additional bowel containing hernia is noted to the left of midline without evidence of obstruction at this site. 2. Small volume free fluid throughout the abdomen is likely reactive. 3. Status post L4 through S1 posterior instrumented  fusion. There is lucency around the S1 screws raising suspicion for loosening. 4.  Aortic Atherosclerosis (ICD10-I70.0). Electronically Signed   By: Valetta Mole M.D.   On: 10/27/2021 13:07  ? ?DG Abdomen 1 View ? ?Result Date: 10/27/2021 ?CLINICAL DATA:  NG-tube. EXAM: ABDOMEN - 1 VIEW COMPARISON:  Abdominal x-ray 08/18/2019. FINDINGS: Nasogastric tube tip is in the proximal body of the stomach with side hole the level of the gastroesophageal junction. Pacemaker wires are present.  Lumbar fusion hardware is present. There is gaseous distention of bowel in the upper abdomen. IMPRESSION: 1. Nasogastric tube tip is at the level of the proximal body with side hole at the gastroesophageal junction. Recommend advancing tube. Electronically Signed   By: Ronney Asters M.D.   On: 10/27/2021 16:45  ? ?US Renal ? ?Result Date: 10/27/2021 ?CLINICAL DATA:  Renal dysfunction EXAM: RENAL / URINARY TRACT ULTRASOUND COMPLETE COMPARISON:  None. FINDINGS: Right Kidney: Renal measurements: 11.7 x 4.7 x 4.9 cm = volume: 139.88 mL. There is no hydronephrosis. There is increased cortical echogenicity. Left Kidney: Renal measurements: 9.4 x 4.7 x 4.3 cm = volume: 100.15 mL. There is no hydronephrosis. There is increased cortical  echogenicity. Bladder: Urinary bladder is not distended and not optimally evaluated. Other: There is increased echogenicity in the visualized portions of liver suggesting possible fatty infiltration. IMPRESSION: There is no hydronephrosis. There is increased cortical echogenicity in the kidneys suggesting possible medical renal disease. Fatty liver. Electronically Signed   By: Elmer Picker M.D.   On: 10/27/2021 14:30  ? ?DG Chest Portable 1 View ? ?Result Date: 10/27/2021 ?CLINICAL DATA:  Hypoxia EXAM: PORTABLE CHEST 1 VIEW COMPARISON:  08/17/2019 FINDINGS: Cardiac enlargement without heart failure. Normal vascularity. AICD unchanged in position. Lungs clear without infiltrate or effusion. IMPRESSION: No active  disease. Electronically Signed   By: Franchot Gallo M.D.   On: 10/27/2021 08:54   ? ?Anti-infectives: ?Anti-infectives (From admission, onward)  ? ? None  ? ?  ? ? ? ?Assessment/Plan ?SBO ?Chronically incarcerated ventra

## 2021-10-28 NOTE — TOC Initial Note (Signed)
Transition of Care (TOC) - Initial/Assessment Note  ? ? ?Patient Details  ?Name: Crystal [name] ?MRN: [id number] ?Date of Birth: [birth year] ? ?Transition of Care (TOC) CM/SW Contact:    ?Tawanna Cooler, RN ?Phone Number: ?10/28/2021, 8:10 AM ? ?Clinical Narrative:                 ? ? ?Transition of Care Department (TOC) has reviewed patient and no TOC needs have been identified at this time. We will continue to monitor patient advancement through interdisciplinary progression rounds. If new patient transition needs arise, please place a TOC consult. ? ? ?Expected Discharge Plan: Home/Self Care ?Barriers to Discharge: Continued Medical Work up ? ?Expected Discharge Plan and Services ?Expected Discharge Plan: Home/Self Care ?  ?  ?Living arrangements for the past 2 months: [place] ?  ? ?Prior Living Arrangements/Services ?Living arrangements for the past 2 months: [place] ?Lives with:: Relatives ?Patient language and need for interpreter reviewed:: Yes ?       ?Need for Family Participation in Patient Care: Yes (Comment) ?Care giver support system in place?: Yes (comment) ?  ?Criminal Activity/Legal Involvement Pertinent to Current Situation/Hospitalization: No - Comment as needed ? ?Activities of Daily Living ?Home Assistive Devices/Equipment: Gilford Rile (specify type), Dentures (specify type) (full upper dentures) ?ADL Screening (condition at time of admission) ?Patient's cognitive ability adequate to safely complete daily activities?: Yes ?Is the patient deaf or have difficulty hearing?: Yes ?Does the patient have difficulty seeing, even when wearing glasses/contacts?: No ?Does the patient have difficulty concentrating, remembering, or making decisions?: Yes (sometimes trouble remembering) ?Patient able to express need for assistance with ADLs?: Yes ?Does the patient have difficulty dressing or bathing?: No ?Independently performs ADLs?: Yes (appropriate for developmental age) (walks independently  with cane) ?Does the patient have difficulty walking or climbing stairs?: Yes ?Weakness of Legs: Both (needs knee replacements on both knees per pt) ?Weakness of Arms/Hands: None ? ?Emotional Assessment ?   ?Orientation: : Oriented to Self, Oriented to  Time, Oriented to Place, Oriented to Situation ?Alcohol / Substance Use: Not Applicable ?Psych Involvement: No (comment) ? ?Admission diagnosis:  Hyperkalemia [E87.5] ?SBO (small bowel obstruction) ([place]) [K56.609] ?AKI (acute kidney injury) ([place]) [N17.9] ?Patient Active Problem List  ? Diagnosis Date Noted  ? Hypermagnesemia 10/27/2021  ? Diabetes mellitus without complication ([place]) 40/98/1191  ? Hav (hallux abducto valgus), unspecified laterality 06/26/2020  ? Lumbar stenosis with neurogenic claudication 01/07/2020  ? Generalized abdominal pain   ? CKD (chronic kidney disease) stage 4, GFR 15-29 ml/min (HCC)   ? SBO (small bowel obstruction) ([place]) 08/17/2019  ? Hyponatremia 08/17/2019  ? COVID-19 virus infection 08/17/2019  ? Acute lower UTI 08/17/2019  ? Prolonged QT interval 08/17/2019  ? Acute hypoxemic respiratory failure due to COVID-19 [facility]) 08/07/2019  ? Normocytic anemia 08/07/2019  ? 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 Congestive heart failure (CHF) ([place]) 06/22/2018  ? Acute systolic heart failure ([place])   ? Type 2 diabetes mellitus with stage 3 chronic kidney disease, with long-term current use of insulin ([place]) 11/22/2017  ? Acute respiratory failure with hypoxia ([place]) 11/22/2017  ? Bowel obstruction ([place]) 08/23/2017  ? Colonic obstruction ([place]) 08/22/2017  ? Dehydration   ? Intractable vomiting with nausea   ? Hyperkalemia   ? Gastroenteritis 08/11/2017  ? Hypotension 08/11/2017  ? Acute renal failure superimposed on stage 3 chronic kidney disease ([place]) 08/11/2017  ? Hypovolemia 08/10/2017  ? Colostomy in place [facility]) 07/13/2017  ? Adjustment disorder with  depressed mood   ? Debilitated 02/06/2017  ? S/P colostomy ([place]) 01/31/2017  ? Pressure injury of  skin 01/28/2017  ? Colitis 01/26/2017  ? Acute MI ([place]) 05/05/2014  ? Anemia, iron deficiency 05/05/2014  ? Airway hyperreactivity 05/05/2014  ? Diabetes mellitus, type 2 ([place]) 05/05/2014  ? Chronic systolic heart failure ([place]) 05/05/2014  ? HLD (hyperlipidemia) 05/05/2014  ? Hypothyroidism 05/05/2014  ? Nonischemic cardiomyopathy ([place]) 02/02/2014  ? LBBB (left bundle branch block) 02/02/2014  ? Chronic systolic dysfunction of left ventricle 02/02/2014  ? Chronic renal insufficiency 02/02/2014  ? Essential hypertension 02/02/2014  ? Morbid obesity ([place]) 02/02/2014  ? Coronary artery disease 08/15/2013  ? Shock ([place]) 08/15/2013  ? History of prolonged Q-T interval on ECG 10/31/2012  ? Hx of cardiomyopathy 10/31/2012  ? Automatic implantable cardioverter-defibrillator in situ 08/06/2008  ? ?PCP:  Crist Infante, MD ?Pharmacy:   ?[place], [place] ?[address] [place] ?[place] [address] ?Phone: [phone] Fax: [phone] ? ? ?

## 2021-10-28 NOTE — Progress Notes (Signed)
Patient is complaining of nausea. Previously administered zofran IV at 2139, but no relief was obtained. MD notified and new orders placed. ?

## 2021-10-29 ENCOUNTER — Inpatient Hospital Stay (HOSPITAL_COMMUNITY): Payer: Medicare Other

## 2021-10-29 DIAGNOSIS — D508 Other iron deficiency anemias: Secondary | ICD-10-CM

## 2021-10-29 DIAGNOSIS — E039 Hypothyroidism, unspecified: Secondary | ICD-10-CM

## 2021-10-29 DIAGNOSIS — K56609 Unspecified intestinal obstruction, unspecified as to partial versus complete obstruction: Secondary | ICD-10-CM | POA: Diagnosis not present

## 2021-10-29 DIAGNOSIS — N179 Acute kidney failure, unspecified: Secondary | ICD-10-CM | POA: Diagnosis not present

## 2021-10-29 DIAGNOSIS — E119 Type 2 diabetes mellitus without complications: Secondary | ICD-10-CM | POA: Diagnosis not present

## 2021-10-29 LAB — COMPREHENSIVE METABOLIC PANEL
ALT: 13 U/L (ref 0–44)
AST: 16 U/L (ref 15–41)
Albumin: 3.3 g/dL — ABNORMAL LOW (ref 3.5–5.0)
Alkaline Phosphatase: 78 U/L (ref 38–126)
Anion gap: 6 (ref 5–15)
BUN: 49 mg/dL — ABNORMAL HIGH (ref 8–23)
CO2: 26 mmol/L (ref 22–32)
Calcium: 8.4 mg/dL — ABNORMAL LOW (ref 8.9–10.3)
Chloride: 110 mmol/L (ref 98–111)
Creatinine, Ser: 2.17 mg/dL — ABNORMAL HIGH (ref 0.44–1.00)
GFR, Estimated: 24 mL/min — ABNORMAL LOW (ref >60)
Glucose, Bld: 110 mg/dL — ABNORMAL HIGH (ref 70–99)
Potassium: 4.7 mmol/L (ref 3.5–5.1)
Sodium: 142 mmol/L (ref 135–145)
Total Bilirubin: 0.4 mg/dL (ref 0.3–1.2)
Total Protein: 7.1 g/dL (ref 6.5–8.1)

## 2021-10-29 LAB — MAGNESIUM: Magnesium: 3.1 mg/dL — ABNORMAL HIGH (ref 1.7–2.4)

## 2021-10-29 LAB — GLUCOSE, CAPILLARY
Glucose-Capillary: 110 mg/dL — ABNORMAL HIGH (ref 70–99)
Glucose-Capillary: 105 mg/dL — ABNORMAL HIGH (ref 70–99)
Glucose-Capillary: 192 mg/dL — ABNORMAL HIGH (ref 70–99)
Glucose-Capillary: 163 mg/dL — ABNORMAL HIGH (ref 70–99)
Glucose-Capillary: 166 mg/dL — ABNORMAL HIGH (ref 70–99)
Glucose-Capillary: 98 mg/dL (ref 70–99)

## 2021-10-29 MED ORDER — LEVOTHYROXINE SODIUM 88 MCG PO TABS
88.0000 ug | ORAL_TABLET | Freq: Every day | ORAL | Status: DC
  Administered 2021-10-30: 88 ug via ORAL
  Filled 2021-10-29: qty 1

## 2021-10-29 MED ORDER — CARVEDILOL 25 MG PO TABS
25.0000 mg | ORAL_TABLET | Freq: Two times a day (BID) | ORAL | Status: DC
  Administered 2021-10-29 – 2021-10-30 (×3): 25 mg via ORAL
  Filled 2021-10-29 (×3): qty 1

## 2021-10-29 NOTE — Progress Notes (Signed)
? ?Subjective/Chief Complaint: ?Patient had at least 7 bm's overnight and now has no abdominal pain and feels much better ? ? ?Objective: ?Vital signs in last 24 hours: ?Temp:  [98.2 ?F (36.8 ?C)-98.6 ?F (37 ?C)] 98.5 ?F (36.9 ?C) (04/07 2841) ?Pulse Rate:  [86-87] 87 (04/07 0432) ?Resp:  [18-19] 19 (04/07 0432) ?BP: (126-133)/(54-58) 133/58 (04/07 0432) ?SpO2:  [94 %-99 %] 94 % (04/07 0432) ?Last BM Date : 10/28/21 ? ?Intake/Output from previous day: ?04/06 0701 - 04/07 0700 ?In: 3104.1 [I.V.:3004.1; NG/GT:100] ?Out: 3244 [Urine:1900; Emesis/NG output:550] ?Intake/Output this shift: ?No intake/output data recorded. ? ?Exam: ?Awake and alert ?Up in a chair and looks comfortable ?Abdomen morbidly obese with chronic incisional hernia, non-tender this morning ? ?Lab Results:  ?Recent Labs  ?  10/27/21 ?0102 10/27/21 ?7253 10/28/21 ?0809  ?WBC 18.0*  --  11.1*  ?HGB 12.2 14.6 10.4*  ?HCT 40.8 43.0 34.5*  ?PLT 345  --  274  ? ?BMET ?Recent Labs  ?  10/28/21 ?0809 10/29/21 ?0339  ?NA 135 142  ?K 4.8 4.7  ?CL 101 110  ?CO2 24 26  ?GLUCOSE 227* 110*  ?BUN 56* 49*  ?CREATININE 2.60* 2.17*  ?CALCIUM 8.5* 8.4*  ? ?PT/INR ?No results for input(s): LABPROT, INR in the last 72 hours. ?ABG ?No results for input(s): PHART, HCO3 in the last 72 hours. ? ?Invalid input(s): PCO2, PO2 ? ?Studies/Results: ?CT ABDOMEN PELVIS WO CONTRAST ? ?Result Date: 10/27/2021 ?CLINICAL DATA:  Constipation, upper abdominal pain, history of bowel obstruction and hernias EXAM: CT ABDOMEN AND PELVIS WITHOUT CONTRAST TECHNIQUE: Multidetector CT imaging of the abdomen and pelvis was performed following the standard protocol without IV contrast. RADIATION DOSE REDUCTION: This exam was performed according to the departmental dose-optimization program which includes automated exposure control, adjustment of the mA and/or kV according to patient size and/or use of iterative reconstruction technique. COMPARISON:  CT abdomen/pelvis 08/17/2019 FINDINGS: Lower  chest: Patchy opacities in the lung bases are favored to reflect subsegmental atelectasis. Cardiac device leads are partially imaged. Hepatobiliary: The liver is mildly enlarged. The liver is otherwise unremarkable, within the confines of noncontrast technique. The gallbladder is unremarkable. There is no biliary ductal dilatation. Pancreas: Unremarkable. Spleen: Unremarkable. 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 The left adrenal is unremarkable. The kidneys are unremarkable, with no focal lesion, stone, hydronephrosis, or hydroureter. The bladder is unremarkable. Stomach/Bowel: The stomach is markedly distended. The small bowel is diffusely dilated with the lumen measuring up to 4.3 cm. There is a left ventral abdominal hernia measuring approximately 4.2 cm across containing a dilated loop of small bowel. Both the efferent and afferent loops of bowel associated with this hernia are dilated. There is an additional hernia more inferiorly and to the right measuring approximately 4.1 cm transverse. There is dilated bowel going into this hernia with fecalized material, and the efferent loop is decompressed, consistent with the transition point (5-60). The bowel beyond this point is decompressed. There is no pneumatosis intestinalis. Postsurgical changes are noted reflecting partial large bowel resection. There is no evidence of complication at the anastomotic site. Vascular/Lymphatic: There is calcified atherosclerotic plaque throughout the nonaneurysmal abdominal aorta. There is no portal venous gas. There is no abdominopelvic lymphadenopathy. Reproductive: Uterus is surgically absent. There is no adnexal mass. Other: There is scattered small volume free fluid in the abdomen. There is no free intraperitoneal air.  Musculoskeletal: Postsurgical changes reflecting L4 through S1 posterior  instrumented fusion are noted. There is lucency around the bilateral S1 screws raising suspicion for loosening. IMPRESSION: 1. Small-bowel obstruction with a transition point identified at a right ventral abdominal wall hernia. There is no pneumatosis or portal venous gas. An additional bowel containing hernia is noted to the left of midline without evidence of obstruction at this site. 2. Small volume free fluid throughout the abdomen is likely reactive. 3. Status post L4 through S1 posterior instrumented fusion. There is lucency around the S1 screws raising suspicion for loosening. 4.  Aortic Atherosclerosis (ICD10-I70.0). Electronically Signed   By: Valetta Mole M.D.   On: 10/27/2021 13:07  ? ?DG Abdomen 1 View ? ?Result Date: 10/27/2021 ?CLINICAL DATA:  NG-tube. EXAM: ABDOMEN - 1 VIEW COMPARISON:  Abdominal x-ray 08/18/2019. FINDINGS: Nasogastric tube tip is in the proximal body of the stomach with side hole the level of the gastroesophageal junction. Pacemaker wires are present.  Lumbar fusion hardware is present. There is gaseous distention of bowel in the upper abdomen. IMPRESSION: 1. Nasogastric tube tip is at the level of the proximal body with side hole at the gastroesophageal junction. Recommend advancing tube. Electronically Signed   By: Ronney Asters M.D.   On: 10/27/2021 16:45  ? ?US Renal ? ?Result Date: 10/27/2021 ?CLINICAL DATA:  Renal dysfunction EXAM: RENAL / URINARY TRACT ULTRASOUND COMPLETE COMPARISON:  None. FINDINGS: Right Kidney: Renal measurements: 11.7 x 4.7 x 4.9 cm = volume: 139.88 mL. There is no hydronephrosis. There is increased cortical echogenicity. Left Kidney: Renal measurements: 9.4 x 4.7 x 4.3 cm = volume: 100.15 mL. There is no hydronephrosis. There is increased cortical echogenicity. Bladder: Urinary bladder is not distended and not optimally evaluated. Other: There is increased echogenicity in the visualized portions of liver suggesting possible fatty infiltration. IMPRESSION:  There is no hydronephrosis. There is increased cortical echogenicity in the kidneys suggesting possible medical renal disease. Fatty liver. Electronically Signed   By: Elmer Picker M.D.   On: 10/27/2021 14:30  ? ?DG Chest Portable 1 View ? ?Result Date: 10/27/2021 ?CLINICAL DATA:  Hypoxia EXAM: PORTABLE CHEST 1 VIEW COMPARISON:  08/17/2019 FINDINGS: Cardiac enlargement without heart failure. Normal vascularity. AICD unchanged in position. Lungs clear without infiltrate or effusion. IMPRESSION: No active disease. Electronically Signed   By: Franchot Gallo M.D.   On: 10/27/2021 08:54  ? ?DG Abd Portable 1V-Small Bowel Obstruction Protocol-initial, 8 hr delay ? ?Result Date: 10/28/2021 ?CLINICAL DATA:  8 hour follow-up small-bowel film EXAM: PORTABLE ABDOMEN - 1 VIEW COMPARISON:  Film from earlier in the same day. FINDINGS: Gastric catheter is noted in the distal stomach. Previously administered contrast material now lies predominately within the colon. No free air is noted. IMPRESSION: Contrast material now lies within the colon consistent with a partial small bowel obstruction. Electronically Signed   By: Inez Catalina M.D.   On: 10/28/2021 22:47  ? ?DG Abd Portable 1V ? ?Result Date: 10/28/2021 ?CLINICAL DATA:  NG placement EXAM: PORTABLE ABDOMEN - 1 VIEW COMPARISON:  10/27/2021 FINDINGS: NG tube in the body the stomach. This is been advanced slightly from yesterday. Normal bowel gas pattern. IMPRESSION: NG tube in the body stomach with slight advancement since yesterday Electronically Signed   By: Franchot Gallo M.D.   On: 10/28/2021 12:18   ? ?Anti-infectives: ?Anti-infectives (From admission, onward)  ? ? None  ? ?  ? ? ?Assessment/Plan: ?SBO ?Chronically incarcerated ventral hernias ?History of multiple prior abdominal surgeries  including partial colectomy, colostomy reversal, ileocecectomy, SBR, hernia repair with vicryl mesh. Most recent surgery 06/2018 by Dr. Hulen Skains ? ?HTN ?Presence of AICD ?CAD ?AKI on CKD  stage 3 - Cr baseline around 1.8 per patient report ?HLD ?Chronic pain ?CHF ?T2DM ?GERD ? ?Clinically, patient has significantly improved regarding pain and bowel function.  After a discussion with her, we wi

## 2021-10-29 NOTE — Plan of Care (Signed)
Crystal [name] has been up in the chair since early AM.  She has ambulated in the hallway as well. She has had several loose BM today.  NGT was d/c this AM and CLD started.  She is tolerating this well and continues to deny nausea or abd pain.  ?Problem: Education: ?Goal: Knowledge of General Education information will improve ?Description: Including pain rating scale, medication(s)/side effects and non-pharmacologic comfort measures ?Outcome: Progressing ?  ?Problem: Health Behavior/Discharge Planning: ?Goal: Ability to manage health-related needs will improve ?Outcome: Progressing ?  ?Problem: Clinical Measurements: ?Goal: Ability to maintain clinical measurements within normal limits will improve ?Outcome: Progressing ?Goal: Diagnostic test results will improve ?Outcome: Progressing ?  ?Problem: Activity: ?Goal: Risk for activity intolerance will decrease ?Outcome: Progressing ?  ?Problem: Nutrition: ?Goal: Adequate nutrition will be maintained ?Outcome: Progressing ?Note: Started on CLD ?  ?Problem: Elimination: ?Goal: Will not experience complications related to bowel motility ?Outcome: Progressing ?Note: Multiple loose BM today ?Goal: Will not experience complications related to urinary retention ?Outcome: Progressing ?  ?Problem: Pain Managment: ?Goal: General experience of comfort will improve ?Outcome: Progressing ?  ?Problem: Safety: ?Goal: Ability to remain free from injury will improve ?Outcome: Progressing ?  ?Problem: Skin Integrity: ?Goal: Risk for impaired skin integrity will decrease ?Outcome: Progressing ?  ?

## 2021-10-29 NOTE — Progress Notes (Signed)
[facility] ? ?PROGRESS NOTE ? ?Crystal [name] RCV:[id number] DOB: Dec 27, 1950 DOA: 10/27/2021 ?PCP: Crist Infante, MD ? ? ?Brief HPI:   ?71 year old female with medical history of CAD, ischemic cardiomyopathy, history of resolved systolic CHF, bradycardia, AICD placement, angioedema, anxiety, depression, osteoarthritis, history of bronchitis, chronic lower back pain, diabetes mellitus type 2, diabetic peripheral neuropathy, GERD, extensive past surgical history of multiple SBO episodes came to hospital with abdominal pain since Sunday, worsened by abdominal distention, nausea and episodes of vomiting. ?CT abdomen/pelvis showed small bowel obstruction with transition point identified high right ventral abdominal wall hernia.  No pneumatosis or portal venous gas.  No additional bowel containing WBC is noted left of midline without evidence of obstruction at the site. ?General surgery was consulted ? ? ? ?Subjective  ? ?Patient seen and examined, feels better this morning.  Had 7 BMs overnight.  Started on clear liquid diet per surgery.  No nausea or vomiting. ? ? Assessment/Plan:  ? ? ?Small bowel obstruction ?-Resolved ?-Patient has history of recurrent SBO ?-She was started on NG tube in place with suction ?-Continue IV fluids ?-Continue antibiotics as needed ?-General surgery consulted, recommended conservative management ?-Patient is improving, will advance diet as per general surgery recommendation. ? ?Acute kidney injury superimposed on stage III CKD ?-Patient baseline creatinine is around 1.8 to 2.0 ?-Presented with creatinine of 3.24 ?-Likely from poor p.o. intake, SBO ?-Nephrology consulted ?-Treated with IV fluids, ?-Creatinine improved to 2.17, back to baseline ?-We will change IV fluids to [facility] ?-Follow BMP in am ? ?Hyperkalemia ?-Resolved ? ?Hypermagnesemia ?-Magnesium level is now down to 3.1 ? ?Diabetes mellitus type 2 ?-Currently n.p.o. ?-She is on high-dose or U-500 insulin at home ?-Started on  sliding scale insulin with NovoLog ?-CBG well controlled ?-Hemoglobin A1c is 8.5 ? ?CAD ?-Denies chest pain or dyspnea ?-Patient takes Coreg 25 mg twice daily at home ?-We will restart home medication Coreg ? ?Hypertension ?-Patient takes Coreg 25 mg p.o. twice daily ?-Coreg has been restarted ?-Start hydralazine as needed ? ?Hypothyroidism ?-Restart Synthroid ? ? ? ? ?Medications ? ?  ? Chlorhexidine Gluconate Cloth  6 each Topical Daily  ? insulin aspart  0-15 Units Subcutaneous Q4H  ? insulin glargine-yfgn  55 Units Subcutaneous BID  ? ? ? Data Reviewed:  ? ?CBG: ? ?Recent Labs  ?Lab 10/28/21 ?1950 10/28/21 ?2332 10/29/21 ?0435 10/29/21 ?0749 10/29/21 ?1146  ?GLUCAP 158* 128* 110* 105* 192*  ? ? ?SpO2: 96 %  ? ? ?Vitals:  ? 10/28/21 1509 10/28/21 1952 10/29/21 0432 10/29/21 1206  ?BP: (!) 126/56 (!) 130/54 (!) 133/58 (!) 159/71  ?Pulse: 86 86 87 85  ?Resp:  '18 19 20  '$ ?Temp: 98.2 ?F (36.8 ?C) 98.6 ?F (37 ?C) 98.5 ?F (36.9 ?C) 98.3 ?F (36.8 ?C)  ?TempSrc: Oral Oral Oral Oral  ?SpO2: 99% 96% 94% 96%  ?Weight:      ?Height:      ? ? ? ? ?Data Reviewed: ? ?Basic Metabolic Panel: ?Recent Labs  ?Lab 10/27/21 ?0905 10/27/21 ?0915 10/27/21 ?1009 10/27/21 ?1409 10/27/21 ?1739 10/27/21 ?2153 10/28/21 ?0200 10/28/21 ?0532 10/28/21 ?1025 10/29/21 ?8527  ?NA 129* 128*  --   --  133*  --   --   --  135 142  ?K 6.5* 6.5* 6.5*   < > 5.5* 5.5* 5.3* 5.4* 4.8 4.7  ?CL 95* 98  --   --  99  --   --   --  101 110  ?CO2 22  --   --   --  24  --   --   --  24 26  ?GLUCOSE 355* 365*  --   --  285*  --   --   --  227* 110*  ?BUN 55* 48*  --   --  56*  --   --   --  56* 49*  ?CREATININE 3.24* 3.40*  --   --  2.85*  --   --   --  2.60* 2.17*  ?CALCIUM 10.0  --   --   --  8.9  --   --   --  8.5* 8.4*  ?MG  --   --  3.7*  --   --   --   --   --   --  3.1*  ? < > = values in this interval not displayed.  ? ? ?CBC: ?Recent Labs  ?Lab 10/27/21 ?0905 10/27/21 ?1856 10/28/21 ?0809  ?WBC 18.0*  --  11.1*  ?NEUTROABS 14.3*  --   --   ?HGB 12.2 14.6  10.4*  ?HCT 40.8 43.0 34.5*  ?MCV 75.6*  --  77.9*  ?PLT 345  --  274  ? ? ?LFT ?Recent Labs  ?Lab 10/27/21 ?0905 10/29/21 ?0339  ?AST 20 16  ?ALT 17 13  ?ALKPHOS 103 78  ?BILITOT 1.1 0.4  ?PROT 9.5* 7.1  ?ALBUMIN 4.5 3.3*  ? ?  ?Antibiotics: ?Anti-infectives (From admission, onward)  ? ? None  ? ?  ? ? ? ?DVT prophylaxis: SCDs ? ?Code Status: Full code ? ?Family Communication: No family at bedside ? ? ?CONSULTS  ? ? ?Objective  ? ? ?Physical Examination: ? ? ?General-appears in no acute distress ?Heart-S1-S2, regular, no murmur auscultated ?Lungs-clear to auscultation bilaterally, no wheezing or crackles auscultated ?Abdomen-soft, nontender, no organomegaly ?Extremities-no edema in the lower extremities ?Neuro-alert, oriented x3, no focal deficit noted ? ? ?Status is: Inpatient: Recurrent small bowel obstruction ? ? ? ?  ? ? ? ? ? ?Oswald Hillock ?  ?[facility] ?If 7PM-7AM, please contact night-coverage at [email or web address], ?Office  [phone] ? ? ?10/29/2021, 2:08 PM  LOS: 2 days  ? ? ? ? ? ? ? ? ? ? ?  ?

## 2021-10-29 NOTE — Progress Notes (Signed)
[facility] ?Progress Note ? ?Subjective: creat down 2.1, UOP 2.7 L yesterday.  ? ?Vitals:  ? 10/28/21 1509 10/28/21 1952 10/29/21 0432 10/29/21 1206  ?BP: (!) 126/56 (!) 130/54 (!) 133/58 (!) 159/71  ?Pulse: 86 86 87 85  ?Resp:  _0 ?Temp: 98.2 ?F (36.8 ?C) 98.6 ?F (37 ?C) 98.5 ?F (36.9 ?C) 98.3 ?F (36.8 ?C)  ?TempSrc: Oral Oral Oral Oral  ?SpO2: 99% 96% 94% 96%  ?Weight:      ?Height:      ? ? ?Exam: ? alert, nad , no distress, calm ? no jvd ? Chest cta bilat ? Cor reg no RG ? Abd soft ntnd no ascites ?  Ext no LE edema ?  Alert, NF, ox3 ? ?    ?    ?   UA 0-5 wbc/ rbc, 30 prot ?    RUS > 11.7 / 9.4 cm kidneys w/o hydro, ^'d echotexture bilat ?  ?  ?  ?Assessment/ Plan: ?AKI on CKD 4   -  b/l creat 1.8- 2.1 from 2021, eGFR 23- 28 ml/min.  F/b [facility] Royce Macadamia at [facility]. Creat here 3.24 in setting of acute SBO, vol depletion, n/v for 2-3 days. Was not voiding much at home but w/ IVF"s and foley cath in ED she started to make urine. UA and renal US unremarkable. Pt looked dry and IVF"s were continued. AKI due to vol depletion and urinary retention has resolved. Creat at baseline. Clear liquids started today. Will lower IVF's, okay to taper off per pmd. No further suggestions, will sign off.  ?SBO - much better, per pmd/ gen surg.  ?Hyperkalemia - resolved. Okay for regular diet.  ?HTN - bp's stable ?SP AICD ?Chronic pain ? ? ? ? ?Rob Doctor, hospital ?10/29/2021, 1:41 PM ? ? ?Recent Labs  ?Lab 10/27/21 ?0905 10/27/21 ?7425 10/27/21 ?1009 10/28/21 ?0809 10/29/21 ?9563  ?HGB 12.2 14.6  --  10.4*  --   ?ALBUMIN 4.5  --   --   --  3.3*  ?CALCIUM 10.0  --    < > 8.5* 8.4*  ?CREATININE 3.24* 3.40*   < > 2.60* 2.17*  ?K 6.5* 6.5*   < > 4.8 4.7  ? < > = values in this interval not displayed.  ? ? ?Inpatient medications: ? Chlorhexidine Gluconate Cloth  6 each Topical Daily  ? insulin aspart  0-15 Units Subcutaneous Q4H  ? insulin glargine-yfgn  55 Units Subcutaneous BID  ? ? sodium chloride 100 mL/hr at 10/29/21 0841   ? ?acetaminophen **OR** acetaminophen, fentaNYL (SUBLIMAZE) injection, hydrALAZINE, ondansetron **OR** ondansetron (ZOFRAN) IV ? ? ? ? ? ? ?

## 2021-10-30 DIAGNOSIS — K56609 Unspecified intestinal obstruction, unspecified as to partial versus complete obstruction: Secondary | ICD-10-CM | POA: Diagnosis not present

## 2021-10-30 LAB — BASIC METABOLIC PANEL
Anion gap: 4 — ABNORMAL LOW (ref 5–15)
BUN: 31 mg/dL — ABNORMAL HIGH (ref 8–23)
CO2: 26 mmol/L (ref 22–32)
Calcium: 8.5 mg/dL — ABNORMAL LOW (ref 8.9–10.3)
Chloride: 109 mmol/L (ref 98–111)
Creatinine, Ser: 1.58 mg/dL — ABNORMAL HIGH (ref 0.44–1.00)
GFR, Estimated: 35 mL/min — ABNORMAL LOW (ref >60)
Glucose, Bld: 78 mg/dL (ref 70–99)
Potassium: 4.4 mmol/L (ref 3.5–5.1)
Sodium: 139 mmol/L (ref 135–145)

## 2021-10-30 LAB — GLUCOSE, CAPILLARY
Glucose-Capillary: 77 mg/dL (ref 70–99)
Glucose-Capillary: 86 mg/dL (ref 70–99)
Glucose-Capillary: 176 mg/dL — ABNORMAL HIGH (ref 70–99)

## 2021-10-30 MED ORDER — ACETAMINOPHEN 325 MG PO TABS: 650.0000 mg | ORAL_TABLET | Freq: Four times a day (QID) | ORAL | 0 refills | Status: DC | PRN

## 2021-10-30 NOTE — Progress Notes (Signed)
Pre-rounds, chart review: ?SBO resolved ?BMP this AM improved ?Possible d/c today pending rounds: if tolerating diet and pain controlled, should be able to be discharged.  ?Will update this note to progress note vs discharge summary later today  ? ?

## 2021-10-30 NOTE — Discharge Summary (Signed)
Physician Discharge Summary  ?Patient ID: ?Crystal [name] ?MRN: [id number] ?DOB/AGE: 72/03/1951 72 y.o. ? ?Admit date: 10/27/2021 ?Discharge date: 10/30/2021 ? ?Admission Diagnoses: Small bowel obstruction ? ?Discharge Diagnoses:  ?Principal Problem: ?  SBO (small bowel obstruction) ([place]) ?Active Problems: ?  Essential hypertension ?  Anemia, iron deficiency ?  Diabetes mellitus, type 2 ([place]) ?  Hypothyroidism ?  Acute renal failure superimposed on stage 3 chronic kidney disease ([place]) ?  Hyperkalemia ?  Coronary artery disease ?  Hypermagnesemia ? ? ?Discharged Condition: good ? ?Hospital Course:  ?Presented to ED 10/27/2021 with chief complaint constipation and abdominal pain ongoing for about a week, complicated by nausea and 2 episodes of emesis day of presentation, urinating less than usual.  AKI and hyponatremia, hyperkalemia were treated with IV fluids, Lokelma, NovoLog, calcium.  CT abdomen/pelvis without contrast demonstrated SBO with transition point identified high right ventral abdominal wall hernia.  NG tube was placed.  General surgery was consulted, plan for conservative management.  AKI improved over the course of her hospitalization, hyperkalemia was resolved.  Overnight for 6/23 to 10/29/2021 had multiple BM.  Started on clear liquid diet per surgery, no nausea or vomiting.  Patient was advanced to full liquid diet and is tolerating this on 10/30/2021, continuing to have bowel movements, no significant pain, kidney function returned to baseline.  Patient feels ready for discharge home. ? ?PCP TO FOLLOW UP ON:  ?Repeat BMP in 1-2 weeks ensure normal renal function and electrolytes  ? ?Consults: general surgery ? ?Significant Diagnostic Studies: see below for labs and radiology ? ?Procedures: no surgeries, NG tube was placed 10/27/21 ? ?Discharge Exam: ?Blood pressure (!) 141/61, pulse 76, temperature 98.2 ?F (36.8 ?C), temperature source Oral, resp. rate 19, height '5\' 4"'$  (1.626 m), weight 110.2 kg, SpO2 100  %. ?General appearance: alert and no distress ?Resp: clear to auscultation bilaterally ?Cardio: regular rate and rhythm, S1, S2 normal, no murmur, click, rub or gallop ?GI: soft, non-tender; bowel sounds normal; no masses,  no organomegaly ?Extremities: extremities normal, atraumatic, no cyanosis or edema ?Skin: Skin color, texture, turgor normal. No rashes or lesions ? ?Disposition: Discharge disposition: 01-Home or Self Care ? ? ? ? ? ? ?Discharge Instructions   ? ? Call MD for:  persistant nausea and vomiting   Complete by: As directed ?  ? Call MD for:  severe uncontrolled pain   Complete by: As directed ?  ? Call MD for:  temperature >100.4   Complete by: As directed ?  ? Diet full liquid   Complete by: As directed ?  ? Discharge instructions   Complete by: As directed ?  ? SCHEDULE FOLLOW UP WITH YOUR PRIMARY CARE PROVIDER IN 1-2 WEEKS. CONTINUE WITH FULL LIQUID DIET TO ADVANCE TO BLAND DIET TO ADVANCE TO REGULAR DIET AS TOLERATED. TRY TO AVOID USING OPIATE PAIN MEDICATIONS AS THIS CAN SLOW YOUR GI TRACT AND PRODUCE CONSTIPATION/OBSTRUCTION.  ? Increase activity slowly   Complete by: As directed ?  ? ?  ? ?Allergies as of 10/30/2021   ? ?   Reactions  ? Ace Inhibitors Swelling, Other (See Comments)  ? Angioedema ?Other reaction(s): swelling ?Other reaction(s): swelling ?Other reaction(s): swelling  ? Glucophage [metformin Hcl] Other (See Comments)  ? Renal failure  ? Telmisartan Other (See Comments)  ? AVOID ARB/ ACEi in this patient due to recurrent AKI  ? Advicor [niacin-lovastatin Er] Other (See Comments)  ? Muscle aches  ? Bystolic [nebivolol Hcl] Swelling  ? Whole  body swelled  ? Erythromycin Diarrhea, Nausea And Vomiting, Other (See Comments)  ? *DERIVATIVES* ?Other reaction(s): severe vomiting ?Other reaction(s): severe vomiting ?Other reaction(s): severe vomiting  ? Lipitor [atorvastatin] Other (See Comments)  ? Muscle aches  ? Lopid [gemfibrozil] Other (See Comments)  ? Muscle aches  ? Statins Other  (See Comments)  ? Muscle aches tolerates crestor  ? Hydromorphone Other (See Comments)  ? Confusion ?Other reaction(s): confusion.can take lower level narcs but those cause a lot of itching  ? Losartan   ? Other reaction(s): stopped 12/2018. ? due to worse gfr ?Other reaction(s): stopped 12/2018. ? due to worse gfr ?Other reaction(s): stopped 12/2018. ? due to worse gfr  ? Nebivolol Hcl   ? Other reaction(s): swelling ?Other reaction(s): swelling ?Other reaction(s): swelling  ? Other   ? Other reaction(s): muscle aches  ? [place]   ? Other reaction(s): itching ?Other reaction(s): itching ?Other reaction(s): itching  ? Spironolactone   ? Other reaction(s): arf ?Other reaction(s): arf ?Other reaction(s): arf  ? ?  ? ?  ?Medication List  ?  ? ?TAKE these medications   ? ?acetaminophen 325 MG tablet ?Commonly known as: TYLENOL ?Take 2 tablets (650 mg total) by mouth every 6 (six) hours as needed for mild pain (or Fever >/= 101). ?  ?albuterol 108 (90 Base) MCG/ACT inhaler ?Commonly known as: VENTOLIN HFA ?Inhale 2 puffs into the lungs every 6 (six) hours as needed for wheezing or shortness of breath. ?  ?allopurinol 100 MG tablet ?Commonly known as: ZYLOPRIM ?Take 100 mg by mouth daily. ?  ?carvedilol 25 MG tablet ?Commonly known as: COREG ?Take 25 mg by mouth 2 (two) times daily. ?  ?Cholecalciferol 50 MCG (2000 UT) Caps ?Take 2,000 Units by mouth daily. ?  ?Crestor 10 MG tablet ?Generic drug: rosuvastatin ?Take 1 tablet (10 mg total) by mouth at bedtime. ?  ?dapagliflozin propanediol 10 MG Tabs tablet ?Commonly known as: FARXIGA ?Take 10 mg by mouth daily. ?  ?DULoxetine 60 MG capsule ?Commonly known as: CYMBALTA ?Take 60 mg by mouth daily. ?  ?febuxostat 40 MG tablet ?Commonly known as: ULORIC ?Take 40 mg by mouth at bedtime. ?  ?gabapentin 300 MG capsule ?Commonly known as: NEURONTIN ?Take 300 mg by mouth at bedtime. ?  ?HumuLIN R U-500 KwikPen 500 UNIT/ML KwikPen ?Generic drug: insulin regular human  CONCENTRATED ?Inject 130 Units into the skin 3 (three) times daily with meals. ?  ?hydrALAZINE 25 MG tablet ?Commonly known as: APRESOLINE ?TAKE 1 TABLET BY MOUTH THREE TIMES DAILY ?What changed: when to take this ?  ?HYDROcodone-acetaminophen 10-325 MG tablet ?Commonly known as: NORCO ?Take 1 tablet by mouth 2 (two) times daily as needed. ?What changed: reasons to take this ?  ?levothyroxine 88 MCG tablet ?Commonly known as: SYNTHROID ?Take 88 mcg by mouth daily before breakfast. ?  ?loratadine 10 MG tablet ?Commonly known as: CLARITIN ?Take 10 mg by mouth daily. ?  ?montelukast 10 MG tablet ?Commonly known as: SINGULAIR ?Take 10 mg by mouth at bedtime. ?  ?omega-3 acid ethyl esters 1 g capsule ?Commonly known as: LOVAZA ?Take 2 g by mouth 2 (two) times daily. ?  ?pantoprazole 40 MG tablet ?Commonly known as: PROTONIX ?Take 40 mg by mouth 2 (two) times daily. ?  ?PRESCRIPTION MEDICATION ?Inhale into the lungs at bedtime. CPAP with  2 liter oxygen ?  ?temazepam 15 MG capsule ?Commonly known as: RESTORIL ?Take 1 capsule (15 mg total) by mouth at bedtime as needed for sleep. ?  ? ?  ? ? ? ?  Signed: ?Emeterio Reeve ?10/30/2021, 2:05 PM ? ? ?

## 2021-10-30 NOTE — Progress Notes (Signed)
? ?  Subjective/Chief Complaint: ?No complaints ?Tolerated liquids without issue.  Denies nausea or abdominal  pain ? ? ?Objective: ?Vital signs in last 24 hours: ?Temp:  [97.9 ?F (36.6 ?C)-99.1 ?F (37.3 ?C)] 97.9 ?F (36.6 ?C) (04/08 0422) ?Pulse Rate:  [66-93] 66 (04/08 0422) ?Resp:  [19-20] 19 (04/08 0422) ?BP: (127-159)/(55-75) 127/55 (04/08 0422) ?SpO2:  [95 %-99 %] 95 % (04/08 0422) ?Last BM Date : 10/29/21 ? ?Intake/Output from previous day: ?04/07 0701 - 04/08 0700 ?In: 2733 [P.O.:1800; I.V.:933] ?Out: 3300 [Urine:3300] ?Intake/Output this shift: ?No intake/output data recorded. ? ?Exam: ?Alert ?Abdomen soft, obese, chronically incarcerated hernia, non-tender ? ?Lab Results:  ?Recent Labs  ?  10/27/21 ?9753 10/27/21 ?0051 10/28/21 ?0809  ?WBC 18.0*  --  11.1*  ?HGB 12.2 14.6 10.4*  ?HCT 40.8 43.0 34.5*  ?PLT 345  --  274  ? ?BMET ?Recent Labs  ?  10/29/21 ?0339 10/30/21 ?0404  ?NA 142 139  ?K 4.7 4.4  ?CL 110 109  ?CO2 26 26  ?GLUCOSE 110* 78  ?BUN 49* 31*  ?CREATININE 2.17* 1.58*  ?CALCIUM 8.4* 8.5*  ? ?PT/INR ?No results for input(s): LABPROT, INR in the last 72 hours. ?ABG ?No results for input(s): PHART, HCO3 in the last 72 hours. ? ?Invalid input(s): PCO2, PO2 ? ?Studies/Results: ?DG Abd Portable 1V-Small Bowel Obstruction Protocol-24 hr delay ? ?Result Date: 10/29/2021 ?CLINICAL DATA:  Small bowel obstruction EXAM: PORTABLE ABDOMEN - 1 VIEW COMPARISON:  10/28/2021 FINDINGS: Mild interval worsening of small bowel dilatation. No suspicious calcifications.  Lumbar fusion hardware again seen. IMPRESSION: Mild interval increase of small bowel dilatation. Electronically Signed   By: Miachel Roux M.D.   On: 10/29/2021 14:53  ? ?DG Abd Portable 1V-Small Bowel Obstruction Protocol-initial, 8 hr delay ? ?Result Date: 10/28/2021 ?CLINICAL DATA:  8 hour follow-up small-bowel film EXAM: PORTABLE ABDOMEN - 1 VIEW COMPARISON:  Film from earlier in the same day. FINDINGS: Gastric catheter is noted in the distal stomach.  Previously administered contrast material now lies predominately within the colon. No free air is noted. IMPRESSION: Contrast material now lies within the colon consistent with a partial small bowel obstruction. Electronically Signed   By: Inez Catalina M.D.   On: 10/28/2021 22:47  ? ?DG Abd Portable 1V ? ?Result Date: 10/28/2021 ?CLINICAL DATA:  NG placement EXAM: PORTABLE ABDOMEN - 1 VIEW COMPARISON:  10/27/2021 FINDINGS: NG tube in the body the stomach. This is been advanced slightly from yesterday. Normal bowel gas pattern. IMPRESSION: NG tube in the body stomach with slight advancement since yesterday Electronically Signed   By: Franchot Gallo M.D.   On: 10/28/2021 12:18   ? ?Anti-infectives: ?Anti-infectives (From admission, onward)  ? ? None  ? ?  ? ? ?Assessment/Plan: ? ?SBO ?Chronically incarcerated ventral hernias ?History of multiple prior abdominal surgeries including partial colectomy, colostomy reversal, ileocecectomy, SBR, hernia repair with vicryl mesh. Most recent surgery 06/2018 by Dr. Hulen Skains ?  ?HTN ?Presence of AICD ?CAD ?AKI on CKD stage 3 - Cr baseline around 1.8 per patient report ?HLD ?Chronic pain ?CHF ?T2DM ?GERD ? ?Will advance to full liquids ?If she tolerates this well, hopefully she can be discharged home tomorrow.  Will not advance past fulls for 2-3 days. ? ?Coralie Keens ?10/30/2021 ? ?

## 2021-11-03 ENCOUNTER — Ambulatory Visit (HOSPITAL_COMMUNITY): Payer: Medicare Other

## 2021-11-03 DIAGNOSIS — Z20822 Contact with and (suspected) exposure to covid-19: Secondary | ICD-10-CM | POA: Diagnosis not present

## 2021-11-04 DIAGNOSIS — E1129 Type 2 diabetes mellitus with other diabetic kidney complication: Secondary | ICD-10-CM | POA: Diagnosis not present

## 2021-11-05 LAB — DRUG TOX ALC METAB W/CON, ORAL FLD: Alcohol Metabolite: NEGATIVE ng/mL (ref <25)

## 2021-11-05 LAB — DRUG TOX MONITOR 1 W/CONF, ORAL FLD
Amphetamines: NEGATIVE ng/mL (ref <10)
Barbiturates: NEGATIVE ng/mL (ref <10)
Benzodiazepines: NEGATIVE ng/mL (ref <0.50)
Buprenorphine: NEGATIVE ng/mL (ref <0.10)
Cocaine: NEGATIVE ng/mL (ref <5.0)
Codeine: NEGATIVE ng/mL (ref <2.5)
Dihydrocodeine: NEGATIVE ng/mL (ref <2.5)
Fentanyl: NEGATIVE ng/mL (ref <0.10)
Heroin Metabolite: NEGATIVE ng/mL (ref <1.0)
Hydrocodone: 4.9 ng/mL — ABNORMAL HIGH (ref <2.5)
Hydromorphone: NEGATIVE ng/mL (ref <2.5)
MARIJUANA: NEGATIVE ng/mL (ref <2.5)
MDMA: NEGATIVE ng/mL (ref <10)
Meprobamate: NEGATIVE ng/mL (ref <2.5)
Methadone: NEGATIVE ng/mL (ref <5.0)
Morphine: NEGATIVE ng/mL (ref <2.5)
Nicotine Metabolite: NEGATIVE ng/mL (ref <5.0)
Norhydrocodone: NEGATIVE ng/mL (ref <2.5)
Noroxycodone: NEGATIVE ng/mL (ref <2.5)
Opiates: POSITIVE ng/mL — AB (ref <2.5)
Oxycodone: NEGATIVE ng/mL (ref <2.5)
Oxymorphone: NEGATIVE ng/mL (ref <2.5)
Phencyclidine: NEGATIVE ng/mL (ref <10)
Tapentadol: NEGATIVE ng/mL (ref <5.0)
Tramadol: NEGATIVE ng/mL (ref <5.0)
Zolpidem: NEGATIVE ng/mL (ref <5.0)

## 2021-11-08 ENCOUNTER — Other Ambulatory Visit (HOSPITAL_COMMUNITY): Payer: Self-pay | Admitting: *Deleted

## 2021-11-08 DIAGNOSIS — L600 Ingrowing nail: Secondary | ICD-10-CM | POA: Diagnosis not present

## 2021-11-09 ENCOUNTER — Encounter (HOSPITAL_COMMUNITY)
Admission: RE | Admit: 2021-11-09 | Discharge: 2021-11-09 | Disposition: A | Discharge status: Home / Self Care | Payer: Medicare Other | Source: Ambulatory Visit | Attending: Nephrology | Admitting: Nephrology

## 2021-11-09 DIAGNOSIS — D631 Anemia in chronic kidney disease: Secondary | ICD-10-CM | POA: Diagnosis not present

## 2021-11-09 DIAGNOSIS — Z20822 Contact with and (suspected) exposure to covid-19: Secondary | ICD-10-CM | POA: Diagnosis not present

## 2021-11-09 DIAGNOSIS — N183 Chronic kidney disease, stage 3 unspecified: Secondary | ICD-10-CM | POA: Insufficient documentation

## 2021-11-09 MED ORDER — SODIUM CHLORIDE 0.9 % IV SOLN
510.0000 mg | INTRAVENOUS | Status: DC
  Administered 2021-11-09: 510 mg via INTRAVENOUS
  Filled 2021-11-09: qty 510

## 2021-11-11 ENCOUNTER — Telehealth: Payer: Self-pay | Admitting: *Deleted

## 2021-11-11 DIAGNOSIS — Z20822 Contact with and (suspected) exposure to covid-19: Secondary | ICD-10-CM | POA: Diagnosis not present

## 2021-11-11 DIAGNOSIS — N184 Chronic kidney disease, stage 4 (severe): Secondary | ICD-10-CM | POA: Diagnosis not present

## 2021-11-11 NOTE — Telephone Encounter (Signed)
Oral swab drug screen was consistent for prescribed medications.  ?

## 2021-11-15 DIAGNOSIS — Z20822 Contact with and (suspected) exposure to covid-19: Secondary | ICD-10-CM | POA: Diagnosis not present

## 2021-11-16 ENCOUNTER — Encounter (HOSPITAL_COMMUNITY)
Admission: RE | Admit: 2021-11-16 | Discharge: 2021-11-16 | Disposition: A | Discharge status: Home / Self Care | Payer: Medicare Other | Source: Ambulatory Visit | Attending: Nephrology | Admitting: Nephrology

## 2021-11-16 DIAGNOSIS — N183 Chronic kidney disease, stage 3 unspecified: Secondary | ICD-10-CM | POA: Diagnosis not present

## 2021-11-16 DIAGNOSIS — D631 Anemia in chronic kidney disease: Secondary | ICD-10-CM | POA: Diagnosis not present

## 2021-11-16 MED ORDER — SODIUM CHLORIDE 0.9 % IV SOLN
510.0000 mg | INTRAVENOUS | Status: AC
  Administered 2021-11-16: 510 mg via INTRAVENOUS
  Filled 2021-11-16: qty 510

## 2021-11-17 DIAGNOSIS — Z20822 Contact with and (suspected) exposure to covid-19: Secondary | ICD-10-CM | POA: Diagnosis not present

## 2021-11-18 DIAGNOSIS — D509 Iron deficiency anemia, unspecified: Secondary | ICD-10-CM | POA: Diagnosis not present

## 2021-11-18 DIAGNOSIS — I5042 Chronic combined systolic (congestive) and diastolic (congestive) heart failure: Secondary | ICD-10-CM | POA: Diagnosis not present

## 2021-11-18 DIAGNOSIS — E1122 Type 2 diabetes mellitus with diabetic chronic kidney disease: Secondary | ICD-10-CM | POA: Diagnosis not present

## 2021-11-18 DIAGNOSIS — I129 Hypertensive chronic kidney disease with stage 1 through stage 4 chronic kidney disease, or unspecified chronic kidney disease: Secondary | ICD-10-CM | POA: Diagnosis not present

## 2021-11-18 DIAGNOSIS — N2581 Secondary hyperparathyroidism of renal origin: Secondary | ICD-10-CM | POA: Diagnosis not present

## 2021-11-18 DIAGNOSIS — N184 Chronic kidney disease, stage 4 (severe): Secondary | ICD-10-CM | POA: Diagnosis not present

## 2021-11-22 DIAGNOSIS — Z20822 Contact with and (suspected) exposure to covid-19: Secondary | ICD-10-CM | POA: Diagnosis not present

## 2021-11-23 DIAGNOSIS — Z20822 Contact with and (suspected) exposure to covid-19: Secondary | ICD-10-CM | POA: Diagnosis not present

## 2021-11-24 ENCOUNTER — Other Ambulatory Visit: Payer: Self-pay | Admitting: Internal Medicine

## 2021-11-24 ENCOUNTER — Encounter: Payer: Medicare Other | Attending: Registered Nurse | Admitting: Registered Nurse

## 2021-11-24 ENCOUNTER — Encounter: Payer: Self-pay | Admitting: Registered Nurse

## 2021-11-24 VITALS — BP 134/79 | HR 78 | Ht 64.0 in | Wt 243.0 lb

## 2021-11-24 DIAGNOSIS — G8929 Other chronic pain: Secondary | ICD-10-CM | POA: Diagnosis not present

## 2021-11-24 DIAGNOSIS — G894 Chronic pain syndrome: Secondary | ICD-10-CM | POA: Diagnosis not present

## 2021-11-24 DIAGNOSIS — M170 Bilateral primary osteoarthritis of knee: Secondary | ICD-10-CM | POA: Insufficient documentation

## 2021-11-24 DIAGNOSIS — M545 Low back pain, unspecified: Secondary | ICD-10-CM | POA: Insufficient documentation

## 2021-11-24 DIAGNOSIS — Z5181 Encounter for therapeutic drug level monitoring: Secondary | ICD-10-CM | POA: Diagnosis not present

## 2021-11-24 DIAGNOSIS — Z79891 Long term (current) use of opiate analgesic: Secondary | ICD-10-CM | POA: Insufficient documentation

## 2021-11-24 DIAGNOSIS — M7918 Myalgia, other site: Secondary | ICD-10-CM | POA: Diagnosis not present

## 2021-11-24 DIAGNOSIS — M2550 Pain in unspecified joint: Secondary | ICD-10-CM | POA: Insufficient documentation

## 2021-11-24 DIAGNOSIS — Z1231 Encounter for screening mammogram for malignant neoplasm of breast: Secondary | ICD-10-CM

## 2021-11-24 MED ORDER — HYDROCODONE-ACETAMINOPHEN 10-325 MG PO TABS: 1.0000 | ORAL_TABLET | Freq: Two times a day (BID) | ORAL | 0 refills | Status: DC | PRN

## 2021-11-24 NOTE — Progress Notes (Signed)
? ?Subjective:  ? ? Patient ID: Crystal [name], female    DOB: Jul 21, 1951, 71 y.o.   MRN: [id number] ? ?HPI: Crystal [name] is a 71 y.o. female who returns for follow up appointment for chronic pain and medication refill. She states her pain is located in her lower back. She rates her pain 5. Her current exercise regime is walking short distances with her cane. ?  ?Crystal [name] was admitted to [facility] on 10/27/2021 and discharged on 10/30/2021. She was admitted with SBO , note was reviewed,  ? ?Crystal [name] Morphine equivalent is 20.00 MME.  Last Oral Swab was Performed on 10/26/2021, it was consistent.   ? ? ?Pain Inventory ?Average Pain 8 ?Pain Right Now 5 ?My pain is intermittent, constant, sharp, burning, stabbing, and aching ? ?In the last 24 hours, has pain interfered with the following? ?General activity 7 ?Relation with others 6 ?Enjoyment of life 8 ?What TIME of day is your pain at its worst? morning  and evening ?Sleep (in general) Good ? ?Pain is worse with: walking, bending, standing, and some activites ?Pain improves with: rest, medication, and injections ?Relief from Meds: 8 ? ? ? ? ? ?Family History  ?Problem Relation Age of Onset  ? Hypertension Father   ? Heart disease Father   ? Cancer Father   ?     prostate  ? Heart disease Other   ?     (Maternal side) Ischemic heart disease  ? Diabetes Mellitus II Other   ? Arrhythmia Mother   ? Diabetes Mellitus II Mother   ?     Borderline DM  ? Hypertension Mother   ? Asthma Mother   ? Cancer Brother   ? Dementia Brother   ? Hypertension Brother   ? Hypertension Daughter   ? Hypertension Daughter   ? Diabetes Daughter   ? Hypertension Daughter   ? Atrial fibrillation Daughter   ? GER disease Daughter   ? Hypertension Son   ? Anxiety disorder Son   ? Hypothyroidism Son   ? Breast cancer Maternal Grandmother   ?     in her 78's  ? ?Social History  ? ?Socioeconomic History  ? Marital status: Widowed  ?  Spouse name: Not on file  ? Number of children: Not on file   ? Years of education: Not on file  ? Highest education level: Not on file  ?Occupational History  ?  Comment: retired LPN  ?Tobacco Use  ? Smoking status: Never  ? Smokeless tobacco: Never  ?Vaping Use  ? Vaping Use: Never used  ?Substance and Sexual Activity  ? Alcohol use: Not Currently  ? Drug use: Never  ? Sexual activity: Not Currently  ?Other Topics Concern  ? Not on file  ?Social History Narrative  ? Pt lives in [place] ([place] alone.  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  ?   ? As of 03/15/17:  ? Diet: 1800 Calorie  ?   ? Caffeine: Yes  ?   ? Married, if yes what year: Widowed, married 1973  ?   ? Do you live in a house, apartment, assisted living, condo, trailer, ect: House, 1 stories, and 1 person  ?   ? Pets: No  ?   ? Current/Past profession: LPN, retired   ?   ? Exercise: Yes, walking   ?   ?   ? Living Will: Yes  ?  DNR: No  ? POA/HPOA: Yes  ?   ? Functional Status:  ? Do you have difficulty bathing or dressing yourself? No  ? Do you have difficulty preparing food or eating? No  ? Do you have difficulty managing your medications? No  ? Do you have difficulty managing your finances? No  ? Do you have difficulty affording your medications? Yes  ? ?Social Determinants of Health  ? ?Financial Resource Strain: Not on file  ?Food Insecurity: Not on file  ?Transportation Needs: Not on file  ?Physical Activity: Not on file  ?Stress: Not on file  ?Social Connections: Not on file  ? ?Past Surgical History:  ?Procedure Laterality Date  ? APPENDECTOMY  07/13/2017  ? BLADDER SUSPENSION  1990  ? "w/hysterectomy"  ? BOWEL RESECTION N/A 07/09/2018  ? Procedure: SMALL BOWEL RESECTION;  Surgeon: Judeth Horn, MD;  Location: [facility];  Service: General;  Laterality: N/A;  ? CARDIAC CATHETERIZATION  2005  ? no obstructive CAD per patient  ? CARDIAC CATHETERIZATION  2018  ? CARDIAC DEFIBRILLATOR PLACEMENT  2005  ? BiV ICD implanted,  LV lead is an epicardial lead  ? CARPAL TUNNEL  RELEASE Left 07/25/2014  ? Dr.Williamson   ? CARPAL TUNNEL RELEASE Right 04/08/2019  ? Procedure: RIGHT CARPAL TUNNEL RELEASE ENDOSCOPIC;  Surgeon: Milly Jakob, MD;  Location: [facility];  Service: Orthopedics;  Laterality: Right;  ? CATARACT EXTRACTION W/ INTRAOCULAR LENS  IMPLANT, BILATERAL Bilateral   ? COLONIC STENT PLACEMENT N/A 08/31/2017  ? Procedure: COLONIC STENT PLACEMENT;  Surgeon: Carol Ada, MD;  Location: [facility];  Service: Endoscopy;  Laterality: N/A;  ? COLONOSCOPY    ? 2010-2011 Dr.Kipreos   ? COLOSTOMY  12/2016  ? Archie Endo 01/26/2017  ? COLOSTOMY REVERSAL N/A 07/13/2017  ? Procedure: COLOSTOMY REVERSAL;  Surgeon: Judeth Horn, MD;  Location: [facility];  Service: General;  Laterality: N/A;  ? CYST REMOVAL HAND Right 05/2013  ? thumb  ? DILATION AND CURETTAGE OF UTERUS  X 5-6  ? ESOPHAGOGASTRODUODENOSCOPY (EGD) WITH PROPOFOL N/A 03/13/2020  ? Procedure: ESOPHAGOGASTRODUODENOSCOPY (EGD) WITH PROPOFOL;  Surgeon: Carol Ada, MD;  Location: [facility];  Service: Endoscopy;  Laterality: N/A;  ? EXCISION MASS ABDOMINAL N/A 07/09/2018  ? Procedure: EXPLORATION OF  ABDOMINAL WOUND ERAS PATHWAY;  Surgeon: Judeth Horn, MD;  Location: [place] [facility];  Service: General;  Laterality: N/A;  ? FLEXIBLE SIGMOIDOSCOPY N/A 08/24/2017  ? Procedure: FLEXIBLE SIGMOIDOSCOPY;  Surgeon: Carol Ada, MD;  Location: [facility];  Service: Endoscopy;  Laterality: N/A;  ? FLEXIBLE SIGMOIDOSCOPY N/A 08/31/2017  ? Procedure: FLEXIBLE SIGMOIDOSCOPY;  Surgeon: Carol Ada, MD;  Location: [facility];  Service: Endoscopy;  Laterality: N/A;  stent placement  ? FLEXIBLE SIGMOIDOSCOPY N/A 03/13/2020  ? Procedure: FLEXIBLE SIGMOIDOSCOPY;  Surgeon: Carol Ada, MD;  Location: [facility];  Service: Endoscopy;  Laterality: N/A;  ? FLEXIBLE SIGMOIDOSCOPY N/A 12/18/2020  ? Procedure: FLEXIBLE SIGMOIDOSCOPY;  Surgeon: Carol Ada, MD;  Location: [facility];  Service: Endoscopy;  Laterality: N/A;  ? HEMOSTASIS CLIP PLACEMENT  03/13/2020  ?  Procedure: HEMOSTASIS CLIP PLACEMENT;  Surgeon: Carol Ada, MD;  Location: [facility];  Service: Endoscopy;;  ? HEMOSTASIS CLIP PLACEMENT  12/18/2020  ? Procedure: HEMOSTASIS CLIP PLACEMENT;  Surgeon: Carol Ada, MD;  Location: [facility];  Service: Endoscopy;;  ? HOT HEMOSTASIS N/A 03/13/2020  ? Procedure: HOT HEMOSTASIS (ARGON PLASMA COAGULATION/BICAP);  Surgeon: Carol Ada, MD;  Location: [facility];  Service: Endoscopy;  Laterality: N/A;  ? HOT HEMOSTASIS N/A 12/18/2020  ?  Procedure: HOT HEMOSTASIS (ARGON PLASMA COAGULATION/BICAP);  Surgeon: Carol Ada, MD;  Location: [facility];  Service: Endoscopy;  Laterality: N/A;  ? IMPLANTABLE CARDIOVERTER DEFIBRILLATOR GENERATOR CHANGE  2008  ? INSERTION OF MESH N/A 07/09/2018  ? Procedure: INSERTION OF VICRYL MESH;  Surgeon: Judeth Horn, MD;  Location: [facility];  Service: General;  Laterality: N/A;  ? LAPAROTOMY N/A 09/18/2017  ? Procedure: EXPLORATORY LAPAROTOMY;  Surgeon: Judeth Horn, MD;  Location: [facility];  Service: General;  Laterality: N/A;  ? LEAD REVISION  06/22/2018  ? LEAD REVISION/REPAIR N/A 06/22/2018  ? Procedure: LEAD REVISION/REPAIR;  Surgeon: Deboraha Sprang, MD;  Location: [facility];  Service: Cardiovascular;  Laterality: N/A;  ? LYSIS OF ADHESION N/A 09/18/2017  ? Procedure: LYSIS OF ADHESION;  Surgeon: Judeth Horn, MD;  Location: [facility];  Service: General;  Laterality: N/A;  ? LYSIS OF ADHESION N/A 07/09/2018  ? Procedure: LYSIS OF ADHESION;  Surgeon: Judeth Horn, MD;  Location: [facility];  Service: General;  Laterality: N/A;  ? PARTIAL COLECTOMY N/A 09/18/2017  ? Procedure: ILEOCOLECTOMY;  Surgeon: Judeth Horn, MD;  Location: [facility];  Service: General;  Laterality: N/A;  ? PARTIAL COLECTOMY  09/25/2017  ? POLYPECTOMY  03/13/2020  ? Procedure: POLYPECTOMY;  Surgeon: Carol Ada, MD;  Location: [facility];  Service: Endoscopy;;  ? RIGHT/LEFT HEART CATH AND CORONARY ANGIOGRAPHY N/A 06/01/2018  ? Procedure: RIGHT/LEFT HEART CATH AND  CORONARY ANGIOGRAPHY;  Surgeon: Jettie Booze, MD;  Location: [facility];  Service: Cardiovascular;  Laterality: N/A;  ? SIGMOIDOSCOPY N/A 09/18/2017  ? Procedure: SIGMOIDOSCOPY;  Surgeon: Hulen Skains

## 2021-11-25 DIAGNOSIS — Z20822 Contact with and (suspected) exposure to covid-19: Secondary | ICD-10-CM | POA: Diagnosis not present

## 2021-11-26 DIAGNOSIS — Z20822 Contact with and (suspected) exposure to covid-19: Secondary | ICD-10-CM | POA: Diagnosis not present

## 2021-11-30 DIAGNOSIS — Z20822 Contact with and (suspected) exposure to covid-19: Secondary | ICD-10-CM | POA: Diagnosis not present

## 2021-12-01 ENCOUNTER — Ambulatory Visit
Admission: RE | Admit: 2021-12-01 | Discharge: 2021-12-01 | Disposition: A | Discharge status: Home / Self Care | Payer: Medicare Other | Source: Ambulatory Visit | Attending: Internal Medicine | Admitting: Internal Medicine

## 2021-12-01 DIAGNOSIS — Z1231 Encounter for screening mammogram for malignant neoplasm of breast: Secondary | ICD-10-CM

## 2021-12-02 DIAGNOSIS — Z20822 Contact with and (suspected) exposure to covid-19: Secondary | ICD-10-CM | POA: Diagnosis not present

## 2021-12-06 DIAGNOSIS — E785 Hyperlipidemia, unspecified: Secondary | ICD-10-CM | POA: Diagnosis not present

## 2021-12-06 DIAGNOSIS — M109 Gout, unspecified: Secondary | ICD-10-CM | POA: Diagnosis not present

## 2021-12-06 DIAGNOSIS — E1159 Type 2 diabetes mellitus with other circulatory complications: Secondary | ICD-10-CM | POA: Diagnosis not present

## 2021-12-14 ENCOUNTER — Ambulatory Visit (INDEPENDENT_AMBULATORY_CARE_PROVIDER_SITE_OTHER): Payer: Medicare Other | Admitting: Internal Medicine

## 2021-12-14 ENCOUNTER — Encounter: Payer: Self-pay | Admitting: Internal Medicine

## 2021-12-14 VITALS — BP 138/60 | HR 78 | Ht 64.0 in | Wt 246.0 lb

## 2021-12-14 DIAGNOSIS — I447 Left bundle-branch block, unspecified: Secondary | ICD-10-CM

## 2021-12-14 DIAGNOSIS — I428 Other cardiomyopathies: Secondary | ICD-10-CM

## 2021-12-14 DIAGNOSIS — I5022 Chronic systolic (congestive) heart failure: Secondary | ICD-10-CM

## 2021-12-14 NOTE — Progress Notes (Signed)
Patient Care Team: Crist Infante, MD as PCP - General (Internal Medicine) Jettie Booze, MD as PCP - Cardiology (Cardiology) Phineas Inches, MD as Consulting Physician (Nephrology) Judeth Horn, MD as Consulting Physician (General Surgery)   HPI  Crystal [name] is a 71 y.o. female Seen following CRT upgrade and revision 11/19 for nonischemic cardiomyopathy left bundle branch block and congestive heart failure.  Previously implanted ICD lead had a threshold of greater than 5.  A new lead was implanted; the previously implanted epicardial LV lead also had exuberant threshold and an LV lead was placed  .The patient denies chest pain, nocturnal dyspnea, orthopnea or peripheral edema.  There have been no palpitations, lightheadedness or syncope.  Complains of  SOB.   Recent hospitalization for SBO >> conservatively   DATE TEST EF   2005 Echo   25 %   2005 LHC  No obstructive CAD  10/15 Echo  50-55%   2018 Echo  50%   11/19 Echo  25-30%   11/19 LHC  No obstructive CAD  5/21 Echo  55-60%    21   Date Cr K Hgb  12/19 1.79 4.5 10.7  11/20 1.8 4.1 12.0  4/23 1.58<<2.6 4.7 10.4       Records and Results Reviewed  Past Medical History:  Diagnosis Date   AICD (automatic cardioverter/defibrillator) present    high RV threshold chronically, device was turned off in 2014; Device battery has been dead x 7 years; "turned it back on 06/22/2018"   Anemia, iron deficiency    Angioedema    felt to likely be due to ace inhibitors but says she has had this even off of medicines, appears to be tolerating ARBs chronically   Anxiety    Arthritis    "hands, legs, arms; bad in my back" (06/22/2018)   Asthmatic bronchitis with status asthmaticus    Bradycardia    CHF (congestive heart failure) (HCC)    Chronic lower back pain    Chronic pain    CKD (chronic kidney disease), stage III (HCC)    Coronary artery disease    Mild nonobstructive (30% LAD, 30% RCA) by 09/01/16 cath  at [facility]   Degenerative joint disease    Depression    Diabetes mellitus, type 2 ([place])    Diabetic peripheral neuropathy ([place])    Dizziness    Dyspnea on exertion    Family history of adverse reaction to anesthesia    Mother has nausea   GERD (gastroesophageal reflux disease)    Gout    Heart valve disorder    History of blood transfusion 1981; ~ 2005; 12/2016   "childbirth; defibrillator OR; colostomy OR"   History of mononucleosis 03/2014   Hyperlipidemia    Hypertension    Hypothyroid    Insomnia    Intervertebral disc degeneration    Ischemic cardiomyopathy    LBBB (left bundle branch block)    Myocardial infarction ([place]) dx'd ~ 2005   Nonischemic cardiomyopathy ([place])    s/p ICD in 2005. EF has since recovered   Obesity    On home oxygen therapy    "2L at night" (06/22/2018)   OSA on CPAP    Pneumonia    "couple times; last time was 12/2016" (06/22/2018)   PONV (postoperative nausea and vomiting)    Presence of permanent cardiac pacemaker    [employer]   Swelling    Syncope    Systemic hypertension  Vitamin D deficiency     Past Surgical History:  Procedure Laterality Date   APPENDECTOMY  07/13/2017   BLADDER SUSPENSION  1990   "w/hysterectomy"   BOWEL RESECTION N/A 07/09/2018   Procedure: SMALL BOWEL RESECTION;  Surgeon: Judeth Horn, MD;  Location: [place];  Service: General;  Laterality: N/A;   CARDIAC CATHETERIZATION  2005   no obstructive CAD per patient   CARDIAC CATHETERIZATION  2018   CARDIAC DEFIBRILLATOR PLACEMENT  2005   BiV ICD implanted,  LV lead is an epicardial lead   CARPAL TUNNEL RELEASE Left 07/25/2014   Dr.Williamson    CARPAL TUNNEL RELEASE Right 04/08/2019   Procedure: RIGHT CARPAL TUNNEL RELEASE ENDOSCOPIC;  Surgeon: Milly Jakob, MD;  Location: [place];  Service: Orthopedics;  Laterality: Right;   CATARACT EXTRACTION W/ INTRAOCULAR LENS  IMPLANT, BILATERAL Bilateral    COLONIC STENT PLACEMENT N/A 08/31/2017    Procedure: COLONIC STENT PLACEMENT;  Surgeon: Carol Ada, MD;  Location: [place];  Service: Endoscopy;  Laterality: N/A;   COLONOSCOPY     2010-2011 Dr.Kipreos    COLOSTOMY  12/2016   Archie Endo 01/26/2017   COLOSTOMY REVERSAL N/A 07/13/2017   Procedure: COLOSTOMY REVERSAL;  Surgeon: Judeth Horn, MD;  Location: [place];  Service: General;  Laterality: N/A;   CYST REMOVAL HAND Right 05/2013   thumb   DILATION AND CURETTAGE OF UTERUS  X 5-6   ESOPHAGOGASTRODUODENOSCOPY (EGD) WITH PROPOFOL N/A 03/13/2020   Procedure: ESOPHAGOGASTRODUODENOSCOPY (EGD) WITH PROPOFOL;  Surgeon: Carol Ada, MD;  [facility];  Service: Endoscopy;  Laterality: N/A;   EXCISION MASS ABDOMINAL N/A 07/09/2018   Procedure: EXPLORATION OF  ABDOMINAL WOUND ERAS PATHWAY;  Surgeon: Judeth Horn, MD;  Location: [place];  Service: General;  Laterality: N/A;   FLEXIBLE SIGMOIDOSCOPY N/A 08/24/2017   Procedure: Beryle Quant;  Surgeon: Carol Ada, MD;  Location: [place];  Service: Endoscopy;  Laterality: N/A;   FLEXIBLE SIGMOIDOSCOPY N/A 08/31/2017   Procedure: FLEXIBLE SIGMOIDOSCOPY;  Surgeon: Carol Ada, MD;  Location: [place];  Service: Endoscopy;  Laterality: N/A;  stent placement   FLEXIBLE SIGMOIDOSCOPY N/A 03/13/2020   Procedure: FLEXIBLE SIGMOIDOSCOPY;  Surgeon: Carol Ada, MD;  [facility];  Service: Endoscopy;  Laterality: N/A;   FLEXIBLE SIGMOIDOSCOPY N/A 12/18/2020   Procedure: FLEXIBLE SIGMOIDOSCOPY;  Surgeon: Carol Ada, MD;  [facility];  Service: Endoscopy;  Laterality: N/A;   HEMOSTASIS CLIP PLACEMENT  03/13/2020   Procedure: HEMOSTASIS CLIP PLACEMENT;  Surgeon: Carol Ada, MD;  [facility];  Service: Endoscopy;;   HEMOSTASIS CLIP PLACEMENT  12/18/2020   Procedure: HEMOSTASIS CLIP PLACEMENT;  Surgeon: Carol Ada, MD;  [facility];  Service: Endoscopy;;   HOT HEMOSTASIS N/A 03/13/2020   Procedure: HOT HEMOSTASIS (ARGON PLASMA  COAGULATION/BICAP);  Surgeon: Carol Ada, MD;  [facility];  Service: Endoscopy;  Laterality: N/A;   HOT HEMOSTASIS N/A 12/18/2020   Procedure: HOT HEMOSTASIS (ARGON PLASMA COAGULATION/BICAP);  Surgeon: Carol Ada, MD;  Location[facility];  Service: Endoscopy;  Laterality: N/A;   IMPLANTABLE CARDIOVERTER DEFIBRILLATOR GENERATOR CHANGE  2008   INSERTION OF MESH N/A 07/09/2018   Procedure: INSERTION OF VICRYL MESH;  Surgeon: Judeth Horn, MD;  Location: [place];  Service: General;  Laterality: N/A;   LAPAROTOMY N/A 09/18/2017   Procedure: EXPLORATORY LAPAROTOMY;  Surgeon: Judeth Horn, MD;  Location: [place];  Service: General;  Laterality: N/A;   LEAD REVISION  06/22/2018   LEAD REVISION/REPAIR N/A 06/22/2018   Procedure: LEAD REVISION/REPAIR;  Surgeon:  Deboraha Sprang, MD;  Location: [place] [facility];  Service: Cardiovascular;  Laterality: N/A;   LYSIS OF ADHESION N/A 09/18/2017   Procedure: LYSIS OF ADHESION;  Surgeon: Judeth Horn, MD;  Location: [place];  Service: General;  Laterality: N/A;   LYSIS OF ADHESION N/A 07/09/2018   Procedure: LYSIS OF ADHESION;  Surgeon: Judeth Horn, MD;  Location: [place];  Service: General;  Laterality: N/A;   PARTIAL COLECTOMY N/A 09/18/2017   Procedure: ILEOCOLECTOMY;  Surgeon: Judeth Horn, MD;  Location: [place];  Service: General;  Laterality: N/A;   PARTIAL COLECTOMY  09/25/2017   POLYPECTOMY  03/13/2020   Procedure: POLYPECTOMY;  Surgeon: Carol Ada, MD;  [facility];  Service: Endoscopy;;   RIGHT/LEFT HEART CATH AND CORONARY ANGIOGRAPHY N/A 06/01/2018   Procedure: RIGHT/LEFT HEART CATH AND CORONARY ANGIOGRAPHY;  Surgeon: Jettie Booze, MD;  Location: [place] [facility];  Service: Cardiovascular;  Laterality: N/A;   SIGMOIDOSCOPY N/A 09/18/2017   Procedure: SIGMOIDOSCOPY;  Surgeon: Judeth Horn, MD;  Location: [place];  Service: General;  Laterality: N/A;   SMALL INTESTINE SURGERY  07/09/2018   EXPLORATION OF  ABDOMINAL WOUND  ERAS PATHWAYN/; MESH; LYSIS OF ADHESIONS   TOTAL ABDOMINAL HYSTERECTOMY  1990   TRANSFORAMINAL LUMBAR INTERBODY FUSION (TLIF) WITH PEDICLE SCREW FIXATION 2 LEVEL N/A 01/07/2020   Procedure: Lumbar Four-Five and Lumbar Five-Sacral One open lumbar decompression and transforaminal lumbar interbody fusion;  Surgeon: Judith Part, MD;  Location: [place];  Service: Neurosurgery;  Laterality: N/A;  Lumbar Four-Five and Lumbar Five-Sacral One open lumbar decompression and transforaminal lumbar interbody fusion   TRIGGER FINGER RELEASE Right 05/213   TRIGGER FINGER RELEASE Right 04/08/2019   Procedure: RIGHT INDEX RELEASE TRIGGER FINGER/A-1 PULLEY;  Surgeon: Milly Jakob, MD;  Location: [place];  Service: Orthopedics;  Laterality: Right;   TUBAL LIGATION  1980s   VESICOVAGINAL FISTULA CLOSURE W/ TAH      Current Meds  Medication Sig   acetaminophen (TYLENOL) 325 MG tablet Take 2 tablets (650 mg total) by mouth every 6 (six) hours as needed for mild pain (or Fever >/= 101).   albuterol (PROVENTIL HFA;VENTOLIN HFA) 108 (90 Base) MCG/ACT inhaler Inhale 2 puffs into the lungs every 6 (six) hours as needed for wheezing or shortness of breath.   allopurinol (ZYLOPRIM) 100 MG tablet Take 100 mg by mouth daily.   carvedilol (COREG) 25 MG tablet Take 25 mg by mouth 2 (two) times daily.    Cholecalciferol 50 MCG (2000 UT) CAPS Take 2,000 Units by mouth daily.   CRESTOR 10 MG tablet Take 1 tablet (10 mg total) by mouth at bedtime.   dapagliflozin propanediol (FARXIGA) 10 MG TABS tablet Take 10 mg by mouth daily.   DULoxetine (CYMBALTA) 60 MG capsule Take 60 mg by mouth daily.   DULoxetine HCl (CYMBALTA PO) Cymbalta   febuxostat (ULORIC) 40 MG tablet Take 40 mg by mouth at bedtime.   gabapentin (NEURONTIN) 300 MG capsule Take 300 mg by mouth at bedtime.   hydrALAZINE (APRESOLINE) 25 MG tablet Take 25 mg by mouth in the morning and at bedtime.   HYDROcodone-acetaminophen (NORCO) 10-325 MG tablet Take 1  tablet by mouth 2 (two) times daily as needed. Do Not Fill Before 12/04/2021   Insulin NPH Human, Isophane, (HUMULIN N [place]) Humulin N   insulin regular human CONCENTRATED (HUMULIN R U-500 KWIKPEN) 500 UNIT/ML kwikpen Inject 130 Units into the skin 3 (three) times daily with meals.   levothyroxine (SYNTHROID, LEVOTHROID)  88 MCG tablet Take 88 mcg by mouth daily before breakfast.    loratadine (CLARITIN) 10 MG tablet Take 10 mg by mouth daily.   montelukast (SINGULAIR) 10 MG tablet Take 10 mg by mouth at bedtime.   omega-3 acid ethyl esters (LOVAZA) 1 g capsule Take 2 g by mouth 2 (two) times daily.   pantoprazole (PROTONIX) 40 MG tablet Take 40 mg by mouth 2 (two) times daily.    Polysaccharide Iron Complex (POLYSACCHARIDE IRON PO) Polysaccharide Iron   PRESCRIPTION MEDICATION Inhale into the lungs at bedtime. CPAP with  2 liter oxygen   temazepam (RESTORIL) 15 MG capsule Take 1 capsule (15 mg total) by mouth at bedtime as needed for sleep.   torsemide (DEMADEX) 20 MG tablet Take 20 mg by mouth as needed.   VITAMIN D, CHOLECALCIFEROL, PO Vitamin D    Allergies  Allergen Reactions   Ace Inhibitors Swelling and Other (See Comments)    Angioedema Other reaction(s): swelling Other reaction(s): swelling Other reaction(s): swelling Other reaction(s): Unknown   Glucophage [Metformin Hcl] Other (See Comments)    Renal failure   Telmisartan Other (See Comments)    AVOID ARB/ ACEi in this patient due to recurrent AKI   Advicor [Niacin-Lovastatin Er] Other (See Comments)    Muscle aches   Bystolic [Nebivolol Hcl] Swelling    Whole body swelled   Erythromycin Diarrhea, Nausea And Vomiting and Other (See Comments)    *DERIVATIVES* Other reaction(s): severe vomiting Other reaction(s): severe vomiting Other reaction(s): severe vomiting Other reaction(s): Unknown   Lipitor [Atorvastatin] Other (See Comments)    Muscle aches   Lopid [Gemfibrozil] Other (See Comments)    Muscle aches   Statins  Other (See Comments)    Muscle aches tolerates crestor Other reaction(s): Unknown   Hydromorphone Other (See Comments)    Confusion Other reaction(s): confusion.can take lower level narcs but those cause a lot of itching   Losartan     Other reaction(s): stopped 12/2018. ? due to worse gfr Other reaction(s): stopped 12/2018. ? due to worse gfr Other reaction(s): stopped 12/2018. ? due to worse gfr   Nebivolol Hcl     Other reaction(s): swelling Other reaction(s): swelling Other reaction(s): swelling Other reaction(s): Unknown   Other     Other reaction(s): muscle aches   Sacubitril-Valsartan     Other reaction(s): itching Other reaction(s): itching Other reaction(s): itching   Spironolactone     Other reaction(s): arf Other reaction(s): arf Other reaction(s): arf      Review of Systems negative except from HPI and PMH  Physical Exam BP 138/60   Pulse 78   Ht '5\' 4"'$  (1.626 m)   Wt 246 lb (111.6 kg)   SpO2 93%   BMI 42.23 kg/m  Well developed and well nourished in no acute distress HENT normal Neck supple with JVP-flat Clear Device pocket well healed; without hematoma or erythema.  There is no tethering  Regular rate and rhythm, no  gallop No  murmur Abd-soft with active BS No Clubbing cyanosis  edema Skin-warm and dry A & Oriented  Grossly normal sensory and motor function  ECG sinus n@ 78 P-synchronous/ AV  pacing  Upright QRS lead V1 and qR lead i    Assessment and Plan:  Nonischemic cardiomyopathy-recurrent  Left bundle branch block-recurrent  Congestive heart failure- chronic systolic  Morbid obesity  Atrial tachycardia recurrent on 12/12 and 12/14    CRT-D-[employer]- RV lead failure newly implanted RV lead and LV lead  Elevated pacing thresholds were noted on the LV lead.  T   Renal insufficiency grade 3  ACE inhibitor allergy-angioedema    Pt heart failure status is stable. Continue torsemide 20  mg prn  Electrolytes are stable.  Cr  about 2.1   Post implant LV function recovery  continue Carvedilol 25 bid    Continue farxiga        Virl Axe

## 2021-12-14 NOTE — Patient Instructions (Signed)
Medication Instructions:  ?Your physician recommends that you continue on your current medications as directed. Please refer to the Current Medication list given to you today. ? ?*If you need a refill on your cardiac medications before your next appointment, please call your pharmacy* ? ? ?Lab Work: ?None ordered. ? ?If you have labs (blood work) drawn today and your tests are completely normal, you will receive your results only by: ?MyChart Message (if you have MyChart) OR ?A paper copy in the mail ?If you have any lab test that is abnormal or we need to change your treatment, we will call you to review the results. ? ? ?Testing/Procedures: ?None ordered. ? ? ? ?Follow-Up: ?At [facility], you and your health needs are our priority.  As part of our continuing mission to provide you with exceptional heart care, we have created designated Provider Care Teams.  These Care Teams include your primary Cardiologist (physician) and Advanced Practice Providers (APPs -  Physician Assistants and Nurse Practitioners) who all work together to provide you with the care you need, when you need it. ? ?We recommend signing up for the patient portal called "MyChart".  Sign up information is provided on this After Visit Summary.  MyChart is used to connect with patients for Virtual Visits (Telemedicine).  Patients are able to view lab/test results, encounter notes, upcoming appointments, etc.  Non-urgent messages can be sent to your provider as well.   ?To learn more about what you can do with MyChart, go to [email or web address].   ? ?Your next appointment:   ?12 month(s) ? ?The format for your next appointment:   ?In Person ? ?Provider:   ?You will see one of the following Advanced Practice Providers on your designated Care Team:   ?Renee Ursuy, PA-C ?Michael "Andy" Tillery, PA-C}  ? ? ? ? ?Important Information About [place] ? ? ? ? ?  ?

## 2021-12-22 DIAGNOSIS — E1129 Type 2 diabetes mellitus with other diabetic kidney complication: Secondary | ICD-10-CM | POA: Diagnosis not present

## 2021-12-22 DIAGNOSIS — M109 Gout, unspecified: Secondary | ICD-10-CM | POA: Diagnosis not present

## 2021-12-22 DIAGNOSIS — E785 Hyperlipidemia, unspecified: Secondary | ICD-10-CM | POA: Diagnosis not present

## 2021-12-24 ENCOUNTER — Ambulatory Visit (INDEPENDENT_AMBULATORY_CARE_PROVIDER_SITE_OTHER): Payer: Medicare Other

## 2021-12-24 DIAGNOSIS — I428 Other cardiomyopathies: Secondary | ICD-10-CM | POA: Diagnosis not present

## 2021-12-24 NOTE — Patient Outreach (Signed)
[facility]) Care Management  12/24/2021  Crystal [name] 06/19/1951 [id number]   Received referral for Care Management from Insurance plan. Assigned patient to Raina Mina, RN Care Coordinator for follow up.   [place] Management Assistant [phone]

## 2021-12-27 LAB — CUP PACEART REMOTE DEVICE CHECK
Battery Remaining Longevity: 66 mo
Battery Remaining Percentage: 73 %
Brady Statistic RA Percent Paced: 0 %
Brady Statistic RV Percent Paced: 100 %
Date Time Interrogation Session: 20230602145300
HighPow Impedance: 76 Ohm
Implantable Lead Implant Date: 20191129
Implantable Lead Implant Date: 20191129
Implantable Lead Implant Date: 20191129
Implantable Lead Location: 753858
Implantable Lead Location: 753859
Implantable Lead Location: 753860
Implantable Lead Model: 293
Implantable Lead Model: 4086
Implantable Lead Serial Number: 226691
Implantable Lead Serial Number: 440868
Implantable Pulse Generator Implant Date: 20191129
Lead Channel Impedance Value: 471 Ohm
Lead Channel Impedance Value: 592 Ohm
Lead Channel Impedance Value: 852 Ohm
Lead Channel Setting Pacing Amplitude: 2 V
Lead Channel Setting Pacing Amplitude: 2.5 V
Lead Channel Setting Pacing Amplitude: 3 V
Lead Channel Setting Pacing Pulse Width: 0.4 ms
Lead Channel Setting Pacing Pulse Width: 1 ms
Lead Channel Setting Sensing Sensitivity: 0.5 mV
Lead Channel Setting Sensing Sensitivity: 1 mV
Pulse Gen Serial Number: 227627

## 2021-12-29 ENCOUNTER — Encounter: Payer: Medicare Other | Attending: Registered Nurse | Admitting: Registered Nurse

## 2021-12-29 ENCOUNTER — Encounter: Payer: Self-pay | Admitting: Registered Nurse

## 2021-12-29 VITALS — BP 127/74 | HR 81 | Ht 64.0 in | Wt 242.0 lb

## 2021-12-29 DIAGNOSIS — Z5181 Encounter for therapeutic drug level monitoring: Secondary | ICD-10-CM

## 2021-12-29 DIAGNOSIS — M2550 Pain in unspecified joint: Secondary | ICD-10-CM

## 2021-12-29 DIAGNOSIS — M545 Low back pain, unspecified: Secondary | ICD-10-CM

## 2021-12-29 DIAGNOSIS — M170 Bilateral primary osteoarthritis of knee: Secondary | ICD-10-CM | POA: Diagnosis not present

## 2021-12-29 DIAGNOSIS — M7918 Myalgia, other site: Secondary | ICD-10-CM | POA: Diagnosis not present

## 2021-12-29 DIAGNOSIS — G8929 Other chronic pain: Secondary | ICD-10-CM | POA: Insufficient documentation

## 2021-12-29 DIAGNOSIS — G894 Chronic pain syndrome: Secondary | ICD-10-CM

## 2021-12-29 MED ORDER — HYDROCODONE-ACETAMINOPHEN 10-325 MG PO TABS: 1.0000 | ORAL_TABLET | Freq: Two times a day (BID) | ORAL | 0 refills | Status: DC | PRN

## 2021-12-29 NOTE — Progress Notes (Signed)
Subjective:     Patient ID: Crystal [name], female   DOB: Feb 18, 1951, 71 y.o.   MRN: [id number]  HPI: Crystal [name] is a 71 y.o. female who returns for follow up appointment for chronic pain and medication refill. She states her pain is located in her lower back and bilateral knee pain R>L. She  rates her pain 4. Her current exercise regime is walking with her walker or cane.  Ms. Grieves Morphine equivalent is 20.00 MME.   Last Oral Swab was Performed on 10/26/2021, it was consistent.     Pain Inventory Average Pain 10 Pain Right Now 4 My pain is constant, sharp, burning, stabbing, and aching  In the last 24 hours, has pain interfered with the following? General activity 8 Relation with others 6 Enjoyment of life 8 What TIME of day is your pain at its worst? morning  and evening Sleep (in general) Good  Pain is worse with: walking, bending, standing, and some activites Pain improves with: rest, medication, injections, and ice Relief from Meds: 9  Family History  Problem Relation Age of Onset   Hypertension Father    Heart disease Father    Cancer Father        prostate   Heart disease Other        (Maternal side) Ischemic heart disease   Diabetes Mellitus II Other    Arrhythmia Mother    Diabetes Mellitus II Mother        Borderline DM   Hypertension Mother    Asthma Mother    Cancer Brother    Dementia Brother    Hypertension Brother    Hypertension Daughter    Hypertension Daughter    Diabetes Daughter    Hypertension Daughter    Atrial fibrillation Daughter    GER disease Daughter    Hypertension Son    Anxiety disorder Son    Hypothyroidism Son    Breast cancer Maternal Grandmother        in her 51's   Social History   Socioeconomic History   Marital status: Widowed    Spouse name: Not on file   Number of children: Not on file   Years of education: Not on file   Highest education level: Not on file  Occupational History    Comment: retired LPN   Tobacco Use   Smoking status: Never   Smokeless tobacco: Never  Vaping Use   Vaping Use: Never used  Substance and Sexual Activity   Alcohol use: Not Currently   Drug use: Never   Sexual activity: Not Currently  Other Topics Concern   Not on file  Social History Narrative   Pt lives in [place].  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      As of 03/15/17:   Diet: 1800 Calorie      Caffeine: Yes      Married, if yes what year: Widowed, married 1973      Do you live in a house, apartment, assisted living, condo, trailer, ect: House, 1 stories, and 1 person      Pets: No      Current/Past profession: LPN, retired       Exercise: Yes, walking          Living Will: Yes   DNR: No   POA/HPOA: Yes      Functional Status:   Do you have difficulty bathing or dressing yourself?  No   Do you have difficulty preparing food or eating? No   Do you have difficulty managing your medications? No   Do you have difficulty managing your finances? No   Do you have difficulty affording your medications? Yes   Social Determinants of Health   Financial Resource Strain: Not on file  Food Insecurity: Not on file  Transportation Needs: Not on file  Physical Activity: Not on file  Stress: Not on file  Social Connections: Not on file   Past Surgical History:  Procedure Laterality Date   APPENDECTOMY  07/13/2017   [facility]   "w/hysterectomy"   BOWEL RESECTION N/A 07/09/2018   Procedure: SMALL BOWEL RESECTION;  Surgeon: Judeth Horn, MD;  Location: [facility];  Service: General;  Laterality: N/A;   CARDIAC CATHETERIZATION  2005   no obstructive CAD per patient   CARDIAC CATHETERIZATION  2018   CARDIAC DEFIBRILLATOR PLACEMENT  2005   BiV ICD implanted,  LV lead is an epicardial lead   CARPAL TUNNEL RELEASE Left 07/25/2014   Dr.Williamson    CARPAL TUNNEL RELEASE Right 04/08/2019   Procedure: RIGHT CARPAL TUNNEL RELEASE  ENDOSCOPIC;  Surgeon: Milly Jakob, MD;  Location: [place];  Service: Orthopedics;  Laterality: Right;   CATARACT EXTRACTION W/ INTRAOCULAR LENS  IMPLANT, BILATERAL Bilateral    COLONIC STENT PLACEMENT N/A 08/31/2017   Procedure: COLONIC STENT PLACEMENT;  Surgeon: Carol Ada, MD;  Location: [facility];  Service: Endoscopy;  Laterality: N/A;   COLONOSCOPY     2010-2011 Dr.Kipreos    COLOSTOMY  12/2016   Archie Endo 01/26/2017   COLOSTOMY REVERSAL N/A 07/13/2017   Procedure: COLOSTOMY REVERSAL;  Surgeon: Judeth Horn, MD;  Location: [facility];  Service: General;  Laterality: N/A;   CYST REMOVAL HAND Right 05/2013   thumb   DILATION AND CURETTAGE OF UTERUS  X 5-6   ESOPHAGOGASTRODUODENOSCOPY (EGD) WITH PROPOFOL N/A 03/13/2020   Procedure: ESOPHAGOGASTRODUODENOSCOPY (EGD) WITH PROPOFOL;  Surgeon: Carol Ada, MD;  [facility];  Service: Endoscopy;  Laterality: N/A;   EXCISION MASS ABDOMINAL N/A 07/09/2018   Procedure: EXPLORATION OF  ABDOMINAL WOUND ERAS PATHWAY;  Surgeon: Judeth Horn, MD;  Location: [place];  Service: General;  Laterality: N/A;   FLEXIBLE SIGMOIDOSCOPY N/A 08/24/2017   Procedure: Beryle Quant;  Surgeon: Carol Ada, MD;  Location: [facility];  Service: Endoscopy;  Laterality: N/A;   FLEXIBLE SIGMOIDOSCOPY N/A 08/31/2017   Procedure: FLEXIBLE SIGMOIDOSCOPY;  Surgeon: Carol Ada, MD;  Location: [facility];  Service: Endoscopy;  Laterality: N/A;  stent placement   FLEXIBLE SIGMOIDOSCOPY N/A 03/13/2020   Procedure: FLEXIBLE SIGMOIDOSCOPY;  Surgeon: Carol Ada, MD;  [facility];  Service: Endoscopy;  Laterality: N/A;   FLEXIBLE SIGMOIDOSCOPY N/A 12/18/2020   Procedure: FLEXIBLE SIGMOIDOSCOPY;  Surgeon: Carol Ada, MD;  [facility];  Service: Endoscopy;  Laterality: N/A;   HEMOSTASIS CLIP PLACEMENT  03/13/2020   Procedure: HEMOSTASIS CLIP PLACEMENT;  Surgeon: Carol Ada, MD;  [facility];  Service: Endoscopy;;   HEMOSTASIS  CLIP PLACEMENT  12/18/2020   Procedure: HEMOSTASIS CLIP PLACEMENT;  Surgeon: Carol Ada, MD;  [facility];  Service: Endoscopy;;   HOT HEMOSTASIS N/A 03/13/2020   Procedure: HOT HEMOSTASIS (ARGON PLASMA COAGULATION/BICAP);  Surgeon: Carol Ada, MD;  [facility];  Service: Endoscopy;  Laterality: N/A;   HOT HEMOSTASIS N/A 12/18/2020   Procedure: HOT HEMOSTASIS (ARGON PLASMA COAGULATION/BICAP);  Surgeon: Carol Ada, MD;  Location[facility];  Service: Endoscopy;  Laterality: N/A;  IMPLANTABLE CARDIOVERTER DEFIBRILLATOR GENERATOR CHANGE  2008   INSERTION OF MESH N/A 07/09/2018   Procedure: INSERTION OF VICRYL MESH;  Surgeon: Judeth Horn, MD;  Location: [facility];  Service: General;  Laterality: N/A;   LAPAROTOMY N/A 09/18/2017   Procedure: EXPLORATORY LAPAROTOMY;  Surgeon: Judeth Horn, MD;  Location: [facility];  Service: General;  Laterality: N/A;   LEAD REVISION  06/22/2018   LEAD REVISION/REPAIR N/A 06/22/2018   Procedure: LEAD REVISION/REPAIR;  Surgeon: Deboraha Sprang, MD;  Location: [facility];  Service: Cardiovascular;  Laterality: N/A;   LYSIS OF ADHESION N/A 09/18/2017   Procedure: LYSIS OF ADHESION;  Surgeon: Judeth Horn, MD;  Location: [facility];  Service: General;  Laterality: N/A;   LYSIS OF ADHESION N/A 07/09/2018   Procedure: LYSIS OF ADHESION;  Surgeon: Judeth Horn, MD;  Location: [facility];  Service: General;  Laterality: N/A;   PARTIAL COLECTOMY N/A 09/18/2017   Procedure: ILEOCOLECTOMY;  Surgeon: Judeth Horn, MD;  Location: [facility];  Service: General;  Laterality: N/A;   PARTIAL COLECTOMY  09/25/2017   POLYPECTOMY  03/13/2020   Procedure: POLYPECTOMY;  Surgeon: Carol Ada, MD;  [facility];  Service: Endoscopy;;   RIGHT/LEFT HEART CATH AND CORONARY ANGIOGRAPHY N/A 06/01/2018   Procedure: RIGHT/LEFT HEART CATH AND CORONARY ANGIOGRAPHY;  Surgeon: Jettie Booze, MD;  Location: [facility];  Service: Cardiovascular;  Laterality:  N/A;   SIGMOIDOSCOPY N/A 09/18/2017   Procedure: SIGMOIDOSCOPY;  Surgeon: Judeth Horn, MD;  Location: [facility];  Service: General;  Laterality: N/A;   SMALL INTESTINE SURGERY  07/09/2018   EXPLORATION OF  ABDOMINAL WOUND ERAS PATHWAYN/; MESH; LYSIS OF ADHESIONS   TOTAL ABDOMINAL HYSTERECTOMY  1990   TRANSFORAMINAL LUMBAR INTERBODY FUSION (TLIF) WITH PEDICLE SCREW FIXATION 2 LEVEL N/A 01/07/2020   Procedure: Lumbar Four-Five and Lumbar Five-Sacral One open lumbar decompression and transforaminal lumbar interbody fusion;  Surgeon: Judith Part, MD;  Location: [facility];  Service: Neurosurgery;  Laterality: N/A;  Lumbar Four-Five and Lumbar Five-Sacral One open lumbar decompression and transforaminal lumbar interbody fusion   TRIGGER FINGER RELEASE Right 05/213   TRIGGER FINGER RELEASE Right 04/08/2019   Procedure: RIGHT INDEX RELEASE TRIGGER FINGER/A-1 PULLEY;  Surgeon: Milly Jakob, MD;  Location: [facility];  Service: Orthopedics;  Laterality: Right;   TUBAL LIGATION  1980s   VESICOVAGINAL FISTULA CLOSURE W/ TAH     Past Surgical History:  Procedure Laterality Date   APPENDECTOMY  07/13/2017   BLADDER SUSPENSION  1990   "w/hysterectomy"   BOWEL RESECTION N/A 07/09/2018   Procedure: SMALL BOWEL RESECTION;  Surgeon: Judeth Horn, MD;  Location: [facility];  Service: General;  Laterality: N/A;   CARDIAC CATHETERIZATION  2005   no obstructive CAD per patient   CARDIAC CATHETERIZATION  2018   CARDIAC DEFIBRILLATOR PLACEMENT  2005   BiV ICD implanted,  LV lead is an epicardial lead   CARPAL TUNNEL RELEASE Left 07/25/2014   Dr.Williamson    CARPAL TUNNEL RELEASE Right 04/08/2019   Procedure: RIGHT CARPAL TUNNEL RELEASE ENDOSCOPIC;  Surgeon: Milly Jakob, MD;  Location: [facility];  Service: Orthopedics;  Laterality: Right;   CATARACT EXTRACTION W/ INTRAOCULAR LENS  IMPLANT, BILATERAL Bilateral    COLONIC STENT PLACEMENT N/A 08/31/2017   Procedure: COLONIC STENT PLACEMENT;  Surgeon: Carol Ada, MD;   Location: [facility];  Service: Endoscopy;  Laterality: N/A;   COLONOSCOPY     2010-2011 Dr.Kipreos    COLOSTOMY  12/2016   Archie Endo 01/26/2017  COLOSTOMY REVERSAL N/A 07/13/2017   Procedure: COLOSTOMY REVERSAL;  Surgeon: Judeth Horn, MD;  Location: [facility];  Service: General;  Laterality: N/A;   CYST REMOVAL HAND Right 05/2013   thumb   DILATION AND CURETTAGE OF UTERUS  X 5-6   ESOPHAGOGASTRODUODENOSCOPY (EGD) WITH PROPOFOL N/A 03/13/2020   Procedure: ESOPHAGOGASTRODUODENOSCOPY (EGD) WITH PROPOFOL;  Surgeon: Carol Ada, MD;  [facility];  Service: Endoscopy;  Laterality: N/A;   EXCISION MASS ABDOMINAL N/A 07/09/2018   Procedure: EXPLORATION OF  ABDOMINAL WOUND ERAS PATHWAY;  Surgeon: Judeth Horn, MD;  Location: [facility];  Service: General;  Laterality: N/A;   FLEXIBLE SIGMOIDOSCOPY N/A 08/24/2017   Procedure: Beryle Quant;  Surgeon: Carol Ada, MD;  Location: [facility];  Service: Endoscopy;  Laterality: N/A;   FLEXIBLE SIGMOIDOSCOPY N/A 08/31/2017   Procedure: FLEXIBLE SIGMOIDOSCOPY;  Surgeon: Carol Ada, MD;  Location: [facility];  Service: Endoscopy;  Laterality: N/A;  stent placement   FLEXIBLE SIGMOIDOSCOPY N/A 03/13/2020   Procedure: FLEXIBLE SIGMOIDOSCOPY;  Surgeon: Carol Ada, MD;  [facility];  Service: Endoscopy;  Laterality: N/A;   FLEXIBLE SIGMOIDOSCOPY N/A 12/18/2020   Procedure: FLEXIBLE SIGMOIDOSCOPY;  Surgeon: Carol Ada, MD;  [facility];  Service: Endoscopy;  Laterality: N/A;   HEMOSTASIS CLIP PLACEMENT  03/13/2020   Procedure: HEMOSTASIS CLIP PLACEMENT;  Surgeon: Carol Ada, MD;  [facility];  Service: Endoscopy;;   HEMOSTASIS CLIP PLACEMENT  12/18/2020   Procedure: HEMOSTASIS CLIP PLACEMENT;  Surgeon: Carol Ada, MD;  [facility];  Service: Endoscopy;;   HOT HEMOSTASIS N/A 03/13/2020   Procedure: HOT HEMOSTASIS (ARGON PLASMA COAGULATION/BICAP);  Surgeon: Carol Ada, MD;  [facility];  Service: Endoscopy;  Laterality: N/A;   HOT HEMOSTASIS N/A 12/18/2020   Procedure: HOT HEMOSTASIS (ARGON PLASMA COAGULATION/BICAP);  Surgeon: Carol Ada, MD;  [facility];  Service: Endoscopy;  Laterality: N/A;   IMPLANTABLE CARDIOVERTER DEFIBRILLATOR GENERATOR CHANGE  2008   INSERTION OF MESH N/A 07/09/2018   Procedure: INSERTION OF VICRYL MESH;  Surgeon: Judeth Horn, MD;  Location: [place];  Service: General;  Laterality: N/A;   LAPAROTOMY N/A 09/18/2017   Procedure: EXPLORATORY LAPAROTOMY;  Surgeon: Judeth Horn, MD;  Location: [place];  Service: General;  Laterality: N/A;   LEAD REVISION  06/22/2018   LEAD REVISION/REPAIR N/A 06/22/2018   Procedure: LEAD REVISION/REPAIR;  Surgeon: Deboraha Sprang, MD;  Location: [facility];  Service: Cardiovascular;  Laterality: N/A;   LYSIS OF ADHESION N/A 09/18/2017   Procedure: LYSIS OF ADHESION;  Surgeon: Judeth Horn, MD;  Location: [facility];  Service: General;  Laterality: N/A;   LYSIS OF ADHESION N/A 07/09/2018   Procedure: LYSIS OF ADHESION;  Surgeon: Judeth Horn, MD;  Location: [facility];  Service: General;  Laterality: N/A;   PARTIAL COLECTOMY N/A 09/18/2017   Procedure: ILEOCOLECTOMY;  Surgeon: Judeth Horn, MD;  Location: [facility];  Service: General;  Laterality: N/A;   PARTIAL COLECTOMY  09/25/2017   POLYPECTOMY  03/13/2020   Procedure: POLYPECTOMY;  Surgeon: Carol Ada, MD;  [facility];  Service: Endoscopy;;   RIGHT/LEFT HEART CATH AND CORONARY ANGIOGRAPHY N/A 06/01/2018   Procedure: RIGHT/LEFT HEART CATH AND CORONARY ANGIOGRAPHY;  Surgeon: Jettie Booze, MD;  Location: [facility];  Service: Cardiovascular;  Laterality: N/A;   SIGMOIDOSCOPY N/A 09/18/2017   Procedure: SIGMOIDOSCOPY;  Surgeon: Judeth Horn, MD;  Location: [facility];  Service: General;  Laterality: N/A;   SMALL INTESTINE SURGERY  07/09/2018   EXPLORATION OF  ABDOMINAL WOUND ERAS PATHWAYN/; MESH; LYSIS OF ADHESIONS   TOTAL ABDOMINAL  HYSTERECTOMY  1990   TRANSFORAMINAL LUMBAR INTERBODY FUSION (TLIF) WITH PEDICLE SCREW FIXATION 2 LEVEL N/A 01/07/2020   Procedure: Lumbar Four-Five and Lumbar Five-Sacral One open lumbar decompression and transforaminal lumbar interbody fusion;  Surgeon: Judith Part, MD;  Location: [facility];  Service: Neurosurgery;  Laterality: N/A;  Lumbar Four-Five and Lumbar Five-Sacral One open lumbar decompression and transforaminal lumbar interbody fusion   TRIGGER FINGER RELEASE Right 05/213   TRIGGER FINGER RELEASE Right 04/08/2019   Procedure: RIGHT INDEX RELEASE TRIGGER FINGER/A-1 PULLEY;  Surgeon: Milly Jakob, MD;  Location: [place] [place];  Service: Orthopedics;  Laterality: Right;   TUBAL LIGATION  1980s   VESICOVAGINAL FISTULA CLOSURE W/ TAH     Past Medical History:  Diagnosis Date   AICD (automatic cardioverter/defibrillator) present    high RV threshold chronically, device was turned off in 2014; Device battery has been dead x 7 years; "turned it back on 06/22/2018"   Anemia, iron deficiency    Angioedema    felt to likely be due to ace inhibitors but says she has had this even off of medicines, appears to be tolerating ARBs chronically   Anxiety    Arthritis    "hands, legs, arms; bad in my back" (06/22/2018)   Asthmatic bronchitis with status asthmaticus    Bradycardia    CHF (congestive heart failure) (HCC)    Chronic lower back pain    Chronic pain    CKD (chronic kidney disease), stage III (HCC)    Coronary artery disease    Mild nonobstructive (30% LAD, 30% RCA) by 09/01/16 cath [facility]   Degenerative joint disease    Depression    Diabetes mellitus, type 2 ([place])    Diabetic peripheral neuropathy ([place])    Dizziness    Dyspnea on exertion    Family history of adverse reaction to anesthesia    Mother has nausea   GERD (gastroesophageal reflux disease)    Gout    Heart valve disorder    History of blood transfusion 1981; ~ 2005; 12/2016   "childbirth; defibrillator  OR; colostomy OR"   History of mononucleosis 03/2014   Hyperlipidemia    Hypertension    Hypothyroid    Insomnia    Intervertebral disc degeneration    Ischemic cardiomyopathy    LBBB (left bundle branch block)    Myocardial infarction ([place]) dx'd ~ 2005   Nonischemic cardiomyopathy ([place])    s/p ICD in 2005. EF has since recovered   Obesity    On home oxygen therapy    "2L at night" (06/22/2018)   OSA on CPAP    Pneumonia    "couple times; last time was 12/2016" (06/22/2018)   PONV (postoperative nausea and vomiting)    Presence of permanent cardiac pacemaker    [employer]   Swelling    Syncope    Systemic hypertension    Vitamin D deficiency    BP 127/74   Pulse 81   Ht '5\' 4"'$  (1.626 m)   Wt 242 lb (109.8 kg)   SpO2 95%   BMI 41.54 kg/m   Opioid Risk Score:   Fall Risk Score:  `1  Depression screen [facility] 2/9     11/24/2021   11:24 AM 10/26/2021   10:22 AM 09/28/2021   10:20 AM 08/25/2021   10:06 AM 07/27/2021   11:01 AM 06/29/2021   10:00 AM 02/26/2021   12:18 PM  Depression screen [facility] 2/9  Decreased Interest 0 0 0 0 0 0 0  Down, Depressed, Hopeless 0 0 0 0 0 0 0  PHQ - 2 Score 0 0 0 0 0 0 0    Review of Systems  Musculoskeletal:  Positive for back pain and gait problem.       Pain in both wrist & both knees      Objective:   Physical Exam Vitals and nursing note reviewed.  Constitutional:      Appearance: Normal appearance.  Cardiovascular:     Rate and Rhythm: Normal rate and regular rhythm.     Pulses: Normal pulses.     Heart sounds: Normal heart sounds.  Pulmonary:     Effort: Pulmonary effort is normal.     Breath sounds: Normal breath sounds.  Musculoskeletal:     Cervical back: Normal range of motion and neck supple.     Comments: Normal Muscle Bulk and Muscle Testing Reveals:  Upper Extremities: Full ROM and Muscle Strength 5/5 Lumbar Paraspinal Tenderness: L-4-L-5 Lower Extremities: Full ROM and Muscle Strength 5/5 Arises from Chair slowly,  using cane for support Narrow Based Gait     Skin:    General: Skin is warm and dry.  Neurological:     Mental Status: She is alert and oriented to person, place, and time.  Psychiatric:        Mood and Affect: Mood normal.        Behavior: Behavior normal.      Assessment: Plan   Chronic Low Back Pain: Encouraged to increase HEP as Tolerated. Continue to Monitor.  12/29/2021 Bilateral Primary Osteoarthritis:  S/P Right Knee Genicular nerve radiofrequency neurotomy, with Dr Letta Pate. On 09/28/2021 and S/P Left Knee Genicular Nerve Radiofrequency on 10/05/2021. With good relief noted.   Indication Chronic post operative pain in the Knee, pain postop total knee replacement which has not responded to conservative management such as physical therapy and medication management; Continue monitor. 12/29/2021 3. Myofascial Pain: S/P Trigger Point Injection on 06/29/2021 with Good Relief Noted. 12/29/2021 4. Chronic Pain Syndrome: Refilled: Hydrocodone 10/325 mg one tablet twice a day as needed for pain #60. We will continue the opioid monitoring program, this consists of regular clinic visits, examinations, urine drug screen, pill counts as well as use of [place] Controlled Substance Reporting system. A 12 month History has been reviewed on the [place] Controlled Substance Reporting System on 12/29/2021.   F/U in 6 weeks

## 2021-12-31 NOTE — Progress Notes (Signed)
Remote ICD transmission.   

## 2022-01-03 ENCOUNTER — Encounter: Payer: Self-pay | Admitting: *Deleted

## 2022-01-03 ENCOUNTER — Other Ambulatory Visit: Payer: Self-pay | Admitting: *Deleted

## 2022-01-03 NOTE — Patient Instructions (Signed)
Visit Information  Thank you for taking time to visit with me today. Please don't hesitate to contact me if I can be of assistance to you before our next scheduled telephone appointment.  Following are the goals we discussed today:  Take all medications as prescribed Attend all scheduled provider appointments Call pharmacy for medication refills 3-7 days in advance of running out of medications Attend church or other social activities Perform all self care activities independently  Perform IADL's (shopping, preparing meals, housekeeping, managing finances) independently Call provider office for new concerns or questions  keep appointment with eye doctor schedule appointment with eye doctor check feet daily for cuts, sores or redness enter blood sugar readings and medication or insulin into daily log take the blood sugar log to all doctor visits trim toenails straight across drink 6 to 8 glasses of water each day fill half of plate with vegetables set a realistic goal switch to sugar-free drinks keep feet up while sitting wash and dry feet carefully every day wear comfortable, cotton socks wear comfortable, well-fitting shoes

## 2022-01-03 NOTE — Patient Outreach (Signed)
[facility]) Care Management Telephonic RN Care Manager Note   01/03/2022 Name:  Crystal [name] MRN:  [id number] DOB:  August 31, 1950  Summary: Enrolled into the Diabetes program. Pt reports her most recent A1c was 7.7 (April) pending another on a scheduled appointment in a few months. Pt on home O2 at night only 2 liters. Pt has her CHF managed at 242 lbs with no residual symptoms (takes torsemide PRN usually 1-2 weekly).  Recommendations/Changes made from today's visit: Strongly encouraged adherence with all discussed today for the plan of care related to diabetes management of care. Will request  pt to review all information that will be sent to her related to diabetes management of care. Subjective: Crystal [name] is an 71 y.o. year old female who is a primary patient of Perini, [name], MD. The care management team was consulted for assistance with care management and/or care coordination needs.    Telephonic RN Care Manager completed Telephone Visit today.  Objective:   Medications Reviewed Today     Reviewed by Tobi Bastos, RN (Registered Nurse) on 01/03/22 at 38  Med List Status: <None>   Medication Order Taking? Sig Documenting Provider Last Dose Status Informant  acetaminophen (TYLENOL) 325 MG tablet [id number] Yes Take 2 tablets (650 mg total) by mouth every 6 (six) hours as needed for mild pain (or Fever >/= 101). Emeterio Reeve, DO Taking Active   albuterol (PROVENTIL HFA;VENTOLIN HFA) 108 [phone] Base) MCG/ACT inhaler [id number] Yes Inhale 2 puffs into the lungs every 6 (six) hours as needed for wheezing or shortness of breath. [provider] Taking Active Self  allopurinol (ZYLOPRIM) 100 MG tablet [id number] Yes Take 100 mg by mouth daily. [provider] Taking Active Self  carvedilol (COREG) 25 MG tablet [id number] Yes Take 25 mg by mouth 2 (two) times daily.  [provider] Taking Active Self  Cholecalciferol 50 MCG (2000 UT) CAPS  [id number] Yes Take 2,000 Units by mouth daily. [provider] Taking Active Self  CRESTOR 10 MG tablet [id number] No Take 1 tablet (10 mg total) by mouth at bedtime.  Patient not taking: Reported on 01/03/2022   Bary Leriche, PA-C Not Taking Active Self  dapagliflozin propanediol (FARXIGA) 10 MG TABS tablet [id number] Yes Take 10 mg by mouth daily. [provider] Taking Active Self  DULoxetine (CYMBALTA) 60 MG capsule [id number] Yes Take 60 mg by mouth daily. [provider] Taking Active Self  DULoxetine HCl (CYMBALTA PO) [id number]  Cymbalta [provider]  Active   febuxostat (ULORIC) 40 MG tablet [id number] Yes Take 40 mg by mouth at bedtime. [provider] Taking Active Self  gabapentin (NEURONTIN) 300 MG capsule [id number] Yes Take 300 mg by mouth at bedtime. [provider] Taking Active Self  hydrALAZINE (APRESOLINE) 25 MG tablet [id number] Yes Take 25 mg by mouth in the morning and at bedtime. [provider] Taking Active   HYDROcodone-acetaminophen (NORCO) 10-325 MG tablet [id number] Yes Take 1 tablet by mouth 2 (two) times daily as needed. Bayard Hugger, NP Taking Active   Insulin NPH Human, Isophane, Camille Bal Starbuck) [id number] No Humulin N  Patient not taking: Reported on 01/03/2022   [provider] Not Taking Active   insulin regular human CONCENTRATED (HUMULIN R U-500 KWIKPEN) 500 UNIT/ML Claiborne Rigg [id number] Yes Inject 130 Units into the skin 3 (three) times daily with meals. [provider] Taking Active Self           Med Note (  Lezlie Lye P   Wed Oct 27, 2021  2:42 PM) Verified w Pharmacy  levothyroxine (SYNTHROID, LEVOTHROID) 88 MCG tablet [id number] Yes Take 88 mcg by mouth daily before breakfast.  [provider] Taking Active Self           Med Note Modena Nunnery, Aldona Bar T   Thu Jan 26, 2017  2:05 PM)    loratadine (CLARITIN) 10 MG tablet [id number] Yes Take 10 mg by mouth daily. [provider] Taking Active Self  montelukast (SINGULAIR) 10 MG tablet [id number] Yes Take 10 mg by mouth at bedtime. [provider] Taking Active Self  omega-3 acid ethyl esters (LOVAZA) 1 g capsule [id number] Yes Take 2 g by mouth 2 (two) times daily. [provider] Taking Active Self  pantoprazole (PROTONIX) 40 MG tablet [id number] Yes Take 40 mg by mouth 2 (two) times daily.  [provider] Taking Active Self  PRESCRIPTION MEDICATION [id number] Yes Inhale into the lungs at bedtime. CPAP with  2 liter oxygen [provider] Taking Active Self  rosuvastatin (CRESTOR) 5 MG tablet [id number] Yes Take 10 mg by mouth daily. [provider] Taking Active   temazepam (RESTORIL) 15 MG capsule [id number] Yes Take 1 capsule (15 mg total) by mouth at bedtime as needed for sleep. Gildardo Cranker, DO Taking Active Self  torsemide (DEMADEX) 20 MG tablet [id number] Yes Take 20 mg by mouth as needed. [provider] Taking Active   VITAMIN D, CHOLECALCIFEROL, PO [id number] No Vitamin D  Patient not taking: Reported on 01/03/2022   [provider] Not Taking Active              SDOH:  (Social Determinants of Health) assessments and interventions performed:  SDOH Interventions    Flowsheet Row Most Recent Value  SDOH Interventions   Food Insecurity Interventions Intervention Not Indicated  Transportation Interventions Intervention Not Indicated        Care Plan  Review of patient past medical history, allergies, medications, health status, including review of consultants reports, laboratory and other test data, was performed as part of comprehensive evaluation for care management services.   Care Plan : RN Care Manager Plan of Care  Updates made by Tobi Bastos, RN since 01/03/2022 12:00 AM     Problem: Knowledge deficit related to Diabetes and care coordination needs   Priority: High     Long-Range Goal: Development plan of care  for management of Diabetes   Start Date: 01/03/2022  Expected End Date: 07/22/2022  Priority: High  Note:   Current Barriers:  Knowledge Deficits related to plan of care for management of DMII   RNCM Clinical Goal(s):  Patient will verbalize understanding of plan for management of DMII as evidenced by chart review and self report take all medications exactly as prescribed and will call provider for medication related questions as evidenced by chart review and self reporting  through collaboration with RN Care manager, provider, and care team.   Interventions: Inter-disciplinary care team collaboration (see longitudinal plan of care) Evaluation of current treatment plan related to  self management and patient's adherence to plan as established by provider   Diabetes Interventions:  (Status:  New goal.) Long Term Goal Assessed patient's understanding of A1c goal: <6.5% Provided education to patient about basic DM disease process Reviewed medications with patient and discussed importance of medication adherence Counseled on importance of regular laboratory monitoring as prescribed Discussed plans with patient for ongoing care management follow up and provided patient  with direct contact information for care management team Provided patient with written educational materials related to hypo and hyperglycemia and importance of correct treatment Reviewed scheduled/upcoming provider appointments including: Reviewed all pending appointments and verified pt has sufficient transportation Review of patient status, including review of consultants reports, relevant laboratory and other test results, and medications completed Screening for signs and symptoms of depression related to chronic disease state  Assessed social determinant of health barriers Lab Results  Component Value Date   HGBA1C 8.5 (H) 10/27/2021  01/03/2022: Pt lives wit her who is the primary caregiver. Pt has HF (managed) and  Diabetes and receptive to enrolling into the care management program to better manage her Diabetes. Educated pt on the risk involved if her glucose levels are not controlled with daily CBGs and her overall A1C current at 8.5 (April). Pt aware and receptive to all education provided today in addition to an education packet that will be sent on diabetes. Pt aware [facility] will communicate with her provider on her disposition with [facility] services. Pt verbalized an understanding of the plan of care and all education provided. Addressed all inquires and request with no additional needs. Will follow up in a few weeks to inquire on pt's ongoing management of care and adherence to the plan discussed. Patient Goals/Self-Care Activities: Take all medications as prescribed Attend all scheduled provider appointments Call pharmacy for medication refills 3-7 days in advance of running out of medications Attend church or other social activities Perform all self care activities independently  Perform IADL's (shopping, preparing meals, housekeeping, managing finances) independently Call provider office for new concerns or questions  keep appointment with eye doctor schedule appointment with eye doctor check feet daily for cuts, sores or redness enter blood sugar readings and medication or insulin into daily log take the blood sugar log to all doctor visits trim toenails straight across drink 6 to 8 glasses of water each day fill half of plate with vegetables set a realistic goal switch to sugar-free drinks keep feet up while sitting wash and dry feet carefully every day wear comfortable, cotton socks wear comfortable, well-fitting shoes  Follow Up Plan:  Telephone follow up appointment with care management team member scheduled for:  July 2023 The patient has been provided with contact information for the care management team and has been advised to call with any health related questions or concerns.  [facility] will follow up with the patient and will provide direct communication to the PCP for this patient.       Raina Mina, RN Care Management Coordinator [place] Office [phone]

## 2022-01-03 NOTE — Patient Outreach (Signed)
[facility]) Care Management  01/03/2022  Crystal [name] Jul 31, 1950 [id number]  Referral Received: 6/2 Initial Outreach: 6/12  Telephone Screening  RN attempted outreach call unsuccessful. RN able to leave a HIPAA approved voice message requesting a call back.  Will send outreach letter and follow up with another outreach call over the next week for pending [facility] services.  Raina Mina, RN Care Management Coordinator [place] Office [phone]

## 2022-01-07 DIAGNOSIS — Z0000 Encounter for general adult medical examination without abnormal findings: Secondary | ICD-10-CM | POA: Diagnosis not present

## 2022-01-07 DIAGNOSIS — Z1212 Encounter for screening for malignant neoplasm of rectum: Secondary | ICD-10-CM | POA: Diagnosis not present

## 2022-01-10 ENCOUNTER — Ambulatory Visit: Payer: Medicare Other | Admitting: *Deleted

## 2022-01-11 DIAGNOSIS — E785 Hyperlipidemia, unspecified: Secondary | ICD-10-CM | POA: Diagnosis not present

## 2022-01-11 DIAGNOSIS — R7989 Other specified abnormal findings of blood chemistry: Secondary | ICD-10-CM | POA: Diagnosis not present

## 2022-01-11 DIAGNOSIS — I10 Essential (primary) hypertension: Secondary | ICD-10-CM | POA: Diagnosis not present

## 2022-01-11 DIAGNOSIS — F419 Anxiety disorder, unspecified: Secondary | ICD-10-CM | POA: Diagnosis not present

## 2022-01-11 DIAGNOSIS — D649 Anemia, unspecified: Secondary | ICD-10-CM | POA: Diagnosis not present

## 2022-01-11 DIAGNOSIS — E039 Hypothyroidism, unspecified: Secondary | ICD-10-CM | POA: Diagnosis not present

## 2022-01-11 DIAGNOSIS — E1129 Type 2 diabetes mellitus with other diabetic kidney complication: Secondary | ICD-10-CM | POA: Diagnosis not present

## 2022-01-11 DIAGNOSIS — M109 Gout, unspecified: Secondary | ICD-10-CM | POA: Diagnosis not present

## 2022-01-14 DIAGNOSIS — E1129 Type 2 diabetes mellitus with other diabetic kidney complication: Secondary | ICD-10-CM | POA: Diagnosis not present

## 2022-01-14 DIAGNOSIS — Z1331 Encounter for screening for depression: Secondary | ICD-10-CM | POA: Diagnosis not present

## 2022-01-14 DIAGNOSIS — E785 Hyperlipidemia, unspecified: Secondary | ICD-10-CM | POA: Diagnosis not present

## 2022-01-14 DIAGNOSIS — R0901 Asphyxia: Secondary | ICD-10-CM | POA: Diagnosis not present

## 2022-01-14 DIAGNOSIS — M8580 Other specified disorders of bone density and structure, unspecified site: Secondary | ICD-10-CM | POA: Diagnosis not present

## 2022-01-14 DIAGNOSIS — Z1339 Encounter for screening examination for other mental health and behavioral disorders: Secondary | ICD-10-CM | POA: Diagnosis not present

## 2022-01-14 DIAGNOSIS — I509 Heart failure, unspecified: Secondary | ICD-10-CM | POA: Diagnosis not present

## 2022-01-14 DIAGNOSIS — Z95810 Presence of automatic (implantable) cardiac defibrillator: Secondary | ICD-10-CM | POA: Diagnosis not present

## 2022-01-14 DIAGNOSIS — E1159 Type 2 diabetes mellitus with other circulatory complications: Secondary | ICD-10-CM | POA: Diagnosis not present

## 2022-01-14 DIAGNOSIS — R82998 Other abnormal findings in urine: Secondary | ICD-10-CM | POA: Diagnosis not present

## 2022-01-14 DIAGNOSIS — G4733 Obstructive sleep apnea (adult) (pediatric): Secondary | ICD-10-CM | POA: Diagnosis not present

## 2022-01-14 DIAGNOSIS — Z794 Long term (current) use of insulin: Secondary | ICD-10-CM | POA: Diagnosis not present

## 2022-01-14 DIAGNOSIS — N184 Chronic kidney disease, stage 4 (severe): Secondary | ICD-10-CM | POA: Diagnosis not present

## 2022-01-14 DIAGNOSIS — Z0000 Encounter for general adult medical examination without abnormal findings: Secondary | ICD-10-CM | POA: Diagnosis not present

## 2022-01-14 DIAGNOSIS — I130 Hypertensive heart and chronic kidney disease with heart failure and stage 1 through stage 4 chronic kidney disease, or unspecified chronic kidney disease: Secondary | ICD-10-CM | POA: Diagnosis not present

## 2022-01-21 DIAGNOSIS — E785 Hyperlipidemia, unspecified: Secondary | ICD-10-CM | POA: Diagnosis not present

## 2022-01-21 DIAGNOSIS — E039 Hypothyroidism, unspecified: Secondary | ICD-10-CM | POA: Diagnosis not present

## 2022-01-21 DIAGNOSIS — I130 Hypertensive heart and chronic kidney disease with heart failure and stage 1 through stage 4 chronic kidney disease, or unspecified chronic kidney disease: Secondary | ICD-10-CM | POA: Diagnosis not present

## 2022-01-21 DIAGNOSIS — E1129 Type 2 diabetes mellitus with other diabetic kidney complication: Secondary | ICD-10-CM | POA: Diagnosis not present

## 2022-01-25 NOTE — Progress Notes (Unsigned)
Cardiology Office Note   Date:  01/26/2022   ID:  Crystal [name], DOB 1951/03/23, MRN [id number]  PCP:  Crist Infante, MD    No chief complaint on file.  Combined heart failure  Wt Readings from Last 3 Encounters:  01/26/22 248 lb (112.5 kg)  01/26/22 248 lb (112.5 kg)  12/29/21 242 lb (109.8 kg)       History of Present Illness: Crystal [name] is a 71 y.o. female  With a h/o, per the old chart,: "Chronic systolic heart failure   - long hx of NICM dating back to 2005, with reported LVEF at that time 24%  - cath at that time showed no significant CAD.   - She has a chronic LBBB. Had a BiV AICD placed previously with epicardial LV lead with subsequent normalization of LVEF  - LVEF improved with BiV pacing to >55%.   - from notes device reached ERI, her RV lead has increasing thresholds around that time. It was decided not to replace the device since normal LVEF and follow her clinically.     - repeat echo 04/2014 LVEF 50-55% - questionable hx of angioedema on ACE-Is, she states has been on ARBs and has tolerated. "   She has CKD as well.    She had echo, cath in 2018 in [place], [place].  No CAD and EF 50% per her report.     SHe has had other medical problems.  She had a colostomy for colitis.  It has been reversed.     She has been gaining weight since that time.  She has gained 30 lbs.   SHe had carpal tunnel surgery.   SHe has been followed by Dr. Caryl Comes as well. 11/19 cath showed: Prox LAD lesion is 25% stenosed. Ost 1st Diag lesion is 25% stenosed. Mid LM lesion is 10% stenosed. LV end diastolic pressure is mildly elevated. LVEDP 17 mm Hg. There is no aortic valve stenosis. Hemodynamic findings consistent with mild pulmonary hypertension. Ao sat 98%, PA sat 66%, CO 4.79 L/min; CI 2.2, mean PA 30 mm Hg; mean PCWP 22 mm Hg   No significant CAD.    Medical therapy for LV dysfunction.    Echo in 09/2018 showed:  The left ventricle has severely reduced systolic  function, with an ejection fraction of 25-30%. The cavity size was moderately dilated. There is mildly increased left ventricular wall thickness. Left ventricular diastolic Doppler parameters are  consistent with impaired relaxation. Left ventricular diffuse hypokinesis.  2. The right ventricle has normal systolic function. The cavity was normal. There is no increase in right ventricular wall thickness.  3. Mild thickening of the mitral valve leaflet. There is mild mitral annular calcification present.  4. The tricuspid valve is grossly normal.  5. The aortic valve is tricuspid Mild thickening of the aortic valve.  6. There is mild dilatation of the aortic root and of the ascending aorta measuring 39 mm.  7. Severe global reduction in LV systolic function; mild diastolic dysfunction; moderate LVE; mild LVH; mild MR.   Had AKI with increased diuretic.    She was hospitalized with COVID in Jan 2021. Treated with Decadron and remdesivir.    She had back pain and had surgery in 5/21 for sciatica.  That went well.  EF had normalized at that time and she had no cardiac issues with surgery.    She got her COVID vaccines.    Did have some anemia in 2021, received  2 transfusions, but improved with IV iron.     Knee pain limits walking.  Trying to avoid TKR.   Denies : Chest pain. Dizziness. Leg edema. Nitroglycerin use. Orthopnea. Palpitations. Paroxysmal nocturnal dyspnea.  Syncope.    Able to manage with the knee pain.  Stable DOE. No volume overload.       Past Medical History:  Diagnosis Date   AICD (automatic cardioverter/defibrillator) present    high RV threshold chronically, device was turned off in 2014; Device battery has been dead x 7 years; "turned it back on 06/22/2018"   Anemia, iron deficiency    Angioedema    felt to likely be due to ace inhibitors but says she has had this even off of medicines, appears to be tolerating ARBs chronically   Anxiety    Arthritis    "hands,  legs, arms; bad in my back" (06/22/2018)   Asthmatic bronchitis with status asthmaticus    Bradycardia    CHF (congestive heart failure) (HCC)    Chronic lower back pain    Chronic pain    CKD (chronic kidney disease), stage III (HCC)    Coronary artery disease    Mild nonobstructive (30% LAD, 30% RCA) by 09/01/16 cath at [facility]   Degenerative joint disease    Depression    Diabetes mellitus, type 2 ([place])    Diabetic peripheral neuropathy ([place])    Dizziness    Dyspnea on exertion    Family history of adverse reaction to anesthesia    Mother has nausea   GERD (gastroesophageal reflux disease)    Gout    Heart valve disorder    History of blood transfusion 1981; ~ 2005; 12/2016   "childbirth; defibrillator OR; colostomy OR"   History of mononucleosis 03/2014   Hyperlipidemia    Hypertension    Hypothyroid    Insomnia    Intervertebral disc degeneration    Ischemic cardiomyopathy    LBBB (left bundle branch block)    Myocardial infarction ([place]) dx'd ~ 2005   Nonischemic cardiomyopathy ([place])    s/p ICD in 2005. EF has since recovered   Obesity    On home oxygen therapy    "2L at night" (06/22/2018)   OSA on CPAP    Pneumonia    "couple times; last time was 12/2016" (06/22/2018)   PONV (postoperative nausea and vomiting)    Presence of permanent cardiac pacemaker    [employer]   Swelling    Syncope    Systemic hypertension    Vitamin D deficiency     Past Surgical History:  Procedure Laterality Date   APPENDECTOMY  07/13/2017   BLADDER SUSPENSION  1990   "w/hysterectomy"   BOWEL RESECTION N/A 07/09/2018   Procedure: SMALL BOWEL RESECTION;  Surgeon: Judeth Horn, MD;  Location: [place];  Service: General;  Laterality: N/A;   CARDIAC CATHETERIZATION  2005   no obstructive CAD per patient   CARDIAC CATHETERIZATION  2018   CARDIAC DEFIBRILLATOR PLACEMENT  2005   BiV ICD implanted,  LV lead is an epicardial lead   CARPAL TUNNEL RELEASE Left 07/25/2014    Dr.Williamson    CARPAL TUNNEL RELEASE Right 04/08/2019   Procedure: RIGHT CARPAL TUNNEL RELEASE ENDOSCOPIC;  Surgeon: Milly Jakob, MD;  Location: [place];  Service: Orthopedics;  Laterality: Right;   CATARACT EXTRACTION W/ INTRAOCULAR LENS  IMPLANT, BILATERAL Bilateral    COLONIC STENT PLACEMENT N/A 08/31/2017   Procedure: COLONIC STENT PLACEMENT;  Surgeon: Carol Ada, MD;  [facility];  Service: Endoscopy;  Laterality: N/A;   COLONOSCOPY     2010-2011 Dr.Kipreos    COLOSTOMY  12/2016   Archie Endo 01/26/2017   COLOSTOMY REVERSAL N/A 07/13/2017   Procedure: COLOSTOMY REVERSAL;  Surgeon: Judeth Horn, MD;  Location: [place];  Service: General;  Laterality: N/A;   CYST REMOVAL HAND Right 05/2013   thumb   DILATION AND CURETTAGE OF UTERUS  X 5-6   ESOPHAGOGASTRODUODENOSCOPY (EGD) WITH PROPOFOL N/A 03/13/2020   Procedure: ESOPHAGOGASTRODUODENOSCOPY (EGD) WITH PROPOFOL;  Surgeon: Carol Ada, MD;  [facility];  Service: Endoscopy;  Laterality: N/A;   EXCISION MASS ABDOMINAL N/A 07/09/2018   Procedure: EXPLORATION OF  ABDOMINAL WOUND ERAS PATHWAY;  Surgeon: Judeth Horn, MD;  Location: [place];  Service: General;  Laterality: N/A;   FLEXIBLE SIGMOIDOSCOPY N/A 08/24/2017   Procedure: Beryle Quant;  Surgeon: Carol Ada, MD;  Location: [place];  Service: Endoscopy;  Laterality: N/A;   FLEXIBLE SIGMOIDOSCOPY N/A 08/31/2017   Procedure: FLEXIBLE SIGMOIDOSCOPY;  Surgeon: Carol Ada, MD;  Location: [place];  Service: Endoscopy;  Laterality: N/A;  stent placement   FLEXIBLE SIGMOIDOSCOPY N/A 03/13/2020   Procedure: FLEXIBLE SIGMOIDOSCOPY;  Surgeon: Carol Ada, MD;  [facility];  Service: Endoscopy;  Laterality: N/A;   FLEXIBLE SIGMOIDOSCOPY N/A 12/18/2020   Procedure: FLEXIBLE SIGMOIDOSCOPY;  Surgeon: Carol Ada, MD;  [facility];  Service: Endoscopy;  Laterality: N/A;   HEMOSTASIS CLIP PLACEMENT  03/13/2020   Procedure: HEMOSTASIS CLIP  PLACEMENT;  Surgeon: Carol Ada, MD;  [facility];  Service: Endoscopy;;   HEMOSTASIS CLIP PLACEMENT  12/18/2020   Procedure: HEMOSTASIS CLIP PLACEMENT;  Surgeon: Carol Ada, MD;  [facility];  Service: Endoscopy;;   HOT HEMOSTASIS N/A 03/13/2020   Procedure: HOT HEMOSTASIS (ARGON PLASMA COAGULATION/BICAP);  Surgeon: Carol Ada, MD;  [facility];  Service: Endoscopy;  Laterality: N/A;   HOT HEMOSTASIS N/A 12/18/2020   Procedure: HOT HEMOSTASIS (ARGON PLASMA COAGULATION/BICAP);  Surgeon: Carol Ada, MD;  Location[facility];  Service: Endoscopy;  Laterality: N/A;   IMPLANTABLE CARDIOVERTER DEFIBRILLATOR GENERATOR CHANGE  2008   INSERTION OF MESH N/A 07/09/2018   Procedure: INSERTION OF VICRYL MESH;  Surgeon: Judeth Horn, MD;  Location: [place];  Service: General;  Laterality: N/A;   LAPAROTOMY N/A 09/18/2017   Procedure: EXPLORATORY LAPAROTOMY;  Surgeon: Judeth Horn, MD;  Location: [place];  Service: General;  Laterality: N/A;   LEAD REVISION  06/22/2018   LEAD REVISION/REPAIR N/A 06/22/2018   Procedure: LEAD REVISION/REPAIR;  Surgeon: Deboraha Sprang, MD;  Location: [place] [facility];  Service: Cardiovascular;  Laterality: N/A;   LYSIS OF ADHESION N/A 09/18/2017   Procedure: LYSIS OF ADHESION;  Surgeon: Judeth Horn, MD;  Location: [place];  Service: General;  Laterality: N/A;   LYSIS OF ADHESION N/A 07/09/2018   Procedure: LYSIS OF ADHESION;  Surgeon: Judeth Horn, MD;  Location: [place];  Service: General;  Laterality: N/A;   PARTIAL COLECTOMY N/A 09/18/2017   Procedure: ILEOCOLECTOMY;  Surgeon: Judeth Horn, MD;  Location: [place];  Service: General;  Laterality: N/A;   PARTIAL COLECTOMY  09/25/2017   POLYPECTOMY  03/13/2020   Procedure: POLYPECTOMY;  Surgeon: Carol Ada, MD;  [facility];  Service: Endoscopy;;   RIGHT/LEFT HEART CATH AND CORONARY ANGIOGRAPHY N/A 06/01/2018   Procedure: RIGHT/LEFT HEART CATH AND CORONARY ANGIOGRAPHY;   Surgeon: Jettie Booze, MD;  Location: [place] [facility];  Service: Cardiovascular;  Laterality: N/A;   SIGMOIDOSCOPY N/A  09/18/2017   Procedure: SIGMOIDOSCOPY;  Surgeon: Judeth Horn, MD;  Location: [place];  Service: General;  Laterality: N/A;   SMALL INTESTINE SURGERY  07/09/2018   EXPLORATION OF  ABDOMINAL WOUND ERAS PATHWAYN/; MESH; LYSIS OF ADHESIONS   TOTAL ABDOMINAL HYSTERECTOMY  1990   TRANSFORAMINAL LUMBAR INTERBODY FUSION (TLIF) WITH PEDICLE SCREW FIXATION 2 LEVEL N/A 01/07/2020   Procedure: Lumbar Four-Five and Lumbar Five-Sacral One open lumbar decompression and transforaminal lumbar interbody fusion;  Surgeon: Judith Part, MD;  Location: [place];  Service: Neurosurgery;  Laterality: N/A;  Lumbar Four-Five and Lumbar Five-Sacral One open lumbar decompression and transforaminal lumbar interbody fusion   TRIGGER FINGER RELEASE Right 05/213   TRIGGER FINGER RELEASE Right 04/08/2019   Procedure: RIGHT INDEX RELEASE TRIGGER FINGER/A-1 PULLEY;  Surgeon: Milly Jakob, MD;  Location: [place];  Service: Orthopedics;  Laterality: Right;   TUBAL LIGATION  1980s   VESICOVAGINAL FISTULA CLOSURE W/ TAH       Current Outpatient Medications  Medication Sig Dispense Refill   acetaminophen (TYLENOL) 325 MG tablet Take 2 tablets (650 mg total) by mouth every 6 (six) hours as needed for mild pain (or Fever >/= 101). 30 tablet 0   albuterol (PROVENTIL HFA;VENTOLIN HFA) 108 (90 Base) MCG/ACT inhaler Inhale 2 puffs into the lungs every 6 (six) hours as needed for wheezing or shortness of breath.     allopurinol (ZYLOPRIM) 100 MG tablet Take 100 mg by mouth daily.     carvedilol (COREG) 25 MG tablet Take 25 mg by mouth 2 (two) times daily.      Cholecalciferol 50 MCG (2000 UT) TABS 1 capsule Orally Once a day     Continuous Blood Gluc Sensor (FREESTYLE LIBRE 2 SENSOR) MISC See admin instructions.     dapagliflozin propanediol (FARXIGA) 10 MG TABS tablet Take 10 mg by mouth daily.      DULoxetine HCl 60 MG CSDR Take 1 capsule by mouth daily.     febuxostat (ULORIC) 40 MG tablet Take 40 mg by mouth at bedtime.     gabapentin (NEURONTIN) 300 MG capsule Take 300 mg by mouth at bedtime.     hydrALAZINE (APRESOLINE) 25 MG tablet Take 25 mg by mouth in the morning and at bedtime.     HYDROcodone-acetaminophen (NORCO) 10-325 MG tablet Take 1 tablet by mouth 3 (three) times daily as needed. 80 tablet 0   INSULIN ASPART [place]      insulin regular human CONCENTRATED (HUMULIN R U-500 KWIKPEN) 500 UNIT/ML kwikpen Inject 130 Units into the skin 3 (three) times daily with meals.     levothyroxine (SYNTHROID, LEVOTHROID) 88 MCG tablet Take 88 mcg by mouth daily before breakfast.      loratadine (CLARITIN) 10 MG tablet Take 10 mg by mouth daily.     montelukast (SINGULAIR) 10 MG tablet Take 10 mg by mouth at bedtime.     omega-3 acid ethyl esters (LOVAZA) 1 g capsule Take 2 g by mouth 2 (two) times daily.     pantoprazole (PROTONIX) 40 MG tablet Take 40 mg by mouth 2 (two) times daily.   2   PRESCRIPTION MEDICATION Inhale into the lungs at bedtime. CPAP with  2 liter oxygen     rosuvastatin (CRESTOR) 5 MG tablet 2 tablet Orally Once a day     temazepam (RESTORIL) 15 MG capsule Take 1 capsule (15 mg total) by mouth at bedtime as needed for sleep. 30 capsule 0   torsemide (DEMADEX) 20 MG tablet Take 20  mg by mouth as needed.     No current facility-administered medications for this visit.    Allergies:   Ace inhibitors, Glucophage [metformin hcl], Telmisartan, Advicor [niacin-lovastatin er], Bystolic [nebivolol hcl], Erythromycin, Lipitor [atorvastatin], Lopid [gemfibrozil], Statins, Hydromorphone, Lisinopril, Losartan, Nebivolol hcl, Other, Sacubitril-valsartan, and Spironolactone    Social History:  The patient  reports that she has never smoked. She has never used smokeless tobacco. She reports that she does not currently use alcohol. She reports that she does not use drugs.   Family  History:  The patient's family history includes Anxiety disorder in her son; Arrhythmia in her mother; Asthma in her mother; Atrial fibrillation in her daughter; Breast cancer in her maternal grandmother; Cancer in her brother and father; Dementia in her brother; Diabetes in her daughter; Diabetes Mellitus II in her mother and another family member; GER disease in her daughter; Heart disease in her father and another family member; Hypertension in her brother, daughter, daughter, daughter, father, mother, and son; Hypothyroidism in her son.    ROS:  Please see the history of present illness.   Otherwise, review of systems are positive for .   All other systems are reviewed and negative.    PHYSICAL EXAM: VS:  BP 126/70   Pulse 71   Ht '5\' 4"'$  (1.626 m)   Wt 248 lb (112.5 kg)   SpO2 95%   BMI 42.57 kg/m  , BMI Body mass index is 42.57 kg/m. GEN: Well nourished, well developed, in no acute distress HEENT: normal Neck: no JVD, carotid bruits, or masses Cardiac: RRR; no murmurs, rubs, or gallops,no edema  Respiratory:  clear to auscultation bilaterally, normal work of breathing GI: soft, nontender, nondistended, + BS MS: no deformity or atrophy Skin: warm and dry, no rash Neuro:  Strength and sensation are intact Psych: euthymic mood, full affect   EKG:   The ekg ordered 5/23 demonstrates V pacing   Recent Labs: 10/28/2021: Hemoglobin 10.4; Platelets 274 10/29/2021: ALT 13; Magnesium 3.1 10/30/2021: BUN 31; Creatinine, Ser 1.58; Potassium 4.4; Sodium 139   Lipid Panel    Component Value Date/Time   CHOL 186 11/22/2017 1507   TRIG 279 (H) 11/22/2017 1507   HDL 48 (L) 11/22/2017 1507   CHOLHDL 3.9 11/22/2017 1507   LDLCALC 98 11/22/2017 1507     Other studies Reviewed: Additional studies/ records that were reviewed today with results demonstrating: .   ASSESSMENT AND PLAN:  NICM: Resolved on 2021 echo.  She appears euvolemic.  Refill hydralazine today. LBBB: s/p pacer/AICD.   Continue follow-up with EP. CKD stage III: Avoid nephrotoxins.  Stay well-hydrated. Hyperlipidemia: LDL 72 in 4/22.  Checked with PMD.  TG elevated per her report recently.  Taking Lovaza.   Will obtain most recent labs from PMD. Diabetes: A1C 7.7 in 4/23.  Whole food, plant-based diet. High-fiber diet. avoid processed foods. Low grade GI bleed in the past.  Labs were recently checked and she was told everything was okay.   Current medicines are reviewed at length with the patient today.  The patient concerns regarding her medicines were addressed.  The following changes have been made:  No change  Labs/ tests ordered today include:  No orders of the defined types were placed in this encounter.   Recommend 150 minutes/week of aerobic exercise Low fat, low carb, high fiber diet recommended  Disposition:   FU in 1 year   Signed, Larae Grooms, MD  01/26/2022 11:33 AM    [facility] [address] [place], [place]  [address] Phone: [phone]; Fax: [phone]

## 2022-01-26 ENCOUNTER — Ambulatory Visit (INDEPENDENT_AMBULATORY_CARE_PROVIDER_SITE_OTHER): Payer: Medicare Other | Admitting: Interventional Cardiology

## 2022-01-26 ENCOUNTER — Encounter: Payer: Self-pay | Admitting: Interventional Cardiology

## 2022-01-26 ENCOUNTER — Encounter: Payer: Self-pay | Admitting: Registered Nurse

## 2022-01-26 ENCOUNTER — Encounter: Payer: Medicare Other | Attending: Registered Nurse | Admitting: Registered Nurse

## 2022-01-26 VITALS — BP 126/70 | HR 71 | Ht 64.0 in | Wt 248.0 lb

## 2022-01-26 VITALS — BP 138/75 | HR 75 | Ht 64.0 in | Wt 248.0 lb

## 2022-01-26 DIAGNOSIS — Z794 Long term (current) use of insulin: Secondary | ICD-10-CM | POA: Diagnosis not present

## 2022-01-26 DIAGNOSIS — Z5181 Encounter for therapeutic drug level monitoring: Secondary | ICD-10-CM

## 2022-01-26 DIAGNOSIS — Z79891 Long term (current) use of opiate analgesic: Secondary | ICD-10-CM

## 2022-01-26 DIAGNOSIS — Z95810 Presence of automatic (implantable) cardiac defibrillator: Secondary | ICD-10-CM | POA: Diagnosis not present

## 2022-01-26 DIAGNOSIS — I5022 Chronic systolic (congestive) heart failure: Secondary | ICD-10-CM | POA: Diagnosis not present

## 2022-01-26 DIAGNOSIS — G894 Chronic pain syndrome: Secondary | ICD-10-CM

## 2022-01-26 DIAGNOSIS — M545 Low back pain, unspecified: Secondary | ICD-10-CM

## 2022-01-26 DIAGNOSIS — G8929 Other chronic pain: Secondary | ICD-10-CM | POA: Insufficient documentation

## 2022-01-26 DIAGNOSIS — I428 Other cardiomyopathies: Secondary | ICD-10-CM | POA: Diagnosis not present

## 2022-01-26 DIAGNOSIS — Z9989 Dependence on other enabling machines and devices: Secondary | ICD-10-CM | POA: Diagnosis not present

## 2022-01-26 DIAGNOSIS — E782 Mixed hyperlipidemia: Secondary | ICD-10-CM | POA: Diagnosis not present

## 2022-01-26 DIAGNOSIS — G4733 Obstructive sleep apnea (adult) (pediatric): Secondary | ICD-10-CM

## 2022-01-26 DIAGNOSIS — I5042 Chronic combined systolic (congestive) and diastolic (congestive) heart failure: Secondary | ICD-10-CM

## 2022-01-26 DIAGNOSIS — M170 Bilateral primary osteoarthritis of knee: Secondary | ICD-10-CM

## 2022-01-26 DIAGNOSIS — E1165 Type 2 diabetes mellitus with hyperglycemia: Secondary | ICD-10-CM

## 2022-01-26 DIAGNOSIS — M2550 Pain in unspecified joint: Secondary | ICD-10-CM

## 2022-01-26 DIAGNOSIS — M7918 Myalgia, other site: Secondary | ICD-10-CM

## 2022-01-26 DIAGNOSIS — N1832 Chronic kidney disease, stage 3b: Secondary | ICD-10-CM | POA: Diagnosis not present

## 2022-01-26 DIAGNOSIS — I447 Left bundle-branch block, unspecified: Secondary | ICD-10-CM

## 2022-01-26 MED ORDER — HYDRALAZINE HCL 25 MG PO TABS: 25.0000 mg | ORAL_TABLET | Freq: Two times a day (BID) | ORAL | 3 refills | Status: DC

## 2022-01-26 MED ORDER — HYDROCODONE-ACETAMINOPHEN 10-325 MG PO TABS: 1.0000 | ORAL_TABLET | Freq: Three times a day (TID) | ORAL | 0 refills | Status: DC | PRN

## 2022-01-26 NOTE — Progress Notes (Signed)
Subjective:    Patient ID: Crystal [name], female    DOB: Nov 03, 1950, 71 y.o.   MRN: [id number]  HPI: Crystal [name] is a 71 y.o. female who returns for follow up appointment for chronic pain and medication refill. She states her pain is located in her lower back pain, bilateral knee pain and generalized joint pain. She also reports her pain has increased in intensity over the last few weeks and she is only receiving 3 hours of relief with her current medication regimen. She rates her pain 7. Her current exercise regime is walking and performing stretching exercises.  Mr. Whisnant Morphine equivalent is 20.00 MME.   Last Oral Swab was Performed on 10/26/2021, it was consistent.      Pain Inventory Average Pain 9 Pain Right Now 7 My pain is intermittent, sharp, burning, and stabbing  In the last 24 hours, has pain interfered with the following? General activity 8 Relation with others 6 Enjoyment of life 6 What TIME of day is your pain at its worst? morning  and evening Sleep (in general) Good  Pain is worse with: walking, bending, standing, and some activites Pain improves with: rest, medication, injections, and ice Relief from Meds: 8  Family History  Problem Relation Age of Onset   Hypertension Father    Heart disease Father    Cancer Father        prostate   Heart disease Other        (Maternal side) Ischemic heart disease   Diabetes Mellitus II Other    Arrhythmia Mother    Diabetes Mellitus II Mother        Borderline DM   Hypertension Mother    Asthma Mother    Cancer Brother    Dementia Brother    Hypertension Brother    Hypertension Daughter    Hypertension Daughter    Diabetes Daughter    Hypertension Daughter    Atrial fibrillation Daughter    GER disease Daughter    Hypertension Son    Anxiety disorder Son    Hypothyroidism Son    Breast cancer Maternal Grandmother        in her 95's   Social History   Socioeconomic History   Marital status: Widowed     Spouse name: Not on file   Number of children: Not on file   Years of education: Not on file   Highest education level: Not on file  Occupational History    Comment: retired LPN  Tobacco Use   Smoking status: Never   Smokeless tobacco: Never  Vaping Use   Vaping Use: Never used  Substance and Sexual Activity   Alcohol use: Not Currently   Drug use: Never   Sexual activity: Not Currently  Other Topics Concern   Not on file  Social History Narrative   Pt lives in [place].  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      As of 03/15/17:   Diet: 1800 Calorie      Caffeine: Yes      Married, if yes what year: Widowed, married 1973      Do you live in a house, apartment, assisted living, [place], trailer, ect: House, 1 stories, and 1 person      Pets: No      Current/Past profession: LPN, retired       Exercise: Yes, walking  Living Will: Yes   DNR: No   POA/HPOA: Yes      Functional Status:   Do you have difficulty bathing or dressing yourself? No   Do you have difficulty preparing food or eating? No   Do you have difficulty managing your medications? No   Do you have difficulty managing your finances? No   Do you have difficulty affording your medications? Yes   Social Determinants of Health   Financial Resource Strain: Low Risk  (10/11/2017)   Overall Financial Resource Strain (CARDIA)    Difficulty of Paying Living Expenses: Not hard at all  Food Insecurity: No Food Insecurity (01/03/2022)   Hunger Vital Sign    Worried About Running Out of Food in the Last Year: Never true    Ran Out of Food in the Last Year: Never true  Transportation Needs: No Transportation Needs (01/03/2022)   PRAPARE - Hydrologist (Medical): No    Lack of Transportation (Non-Medical): No  Physical Activity: Inactive (10/11/2017)   Exercise Vital Sign    Days of Exercise per Week: 0 days    Minutes  of Exercise per Session: 0 min  Stress: Stress Concern Present (10/11/2017)   [place]    Feeling of Stress : To some extent  Social Connections: Somewhat Isolated (10/11/2017)   Social Connection and Isolation Panel [NHANES]    Frequency of Communication with Friends and Family: More than three times a week    Frequency of Social Gatherings with Friends and Family: More than three times a week    Attends Religious Services: More than 4 times per year    Active Member of [employer] or Organizations: No    Attends Archivist Meetings: Never    Marital Status: Widowed   Past Surgical History:  Procedure Laterality Date   APPENDECTOMY  07/13/2017   BLADDER SUSPENSION  1990   "w/hysterectomy"   BOWEL RESECTION N/A 07/09/2018   Procedure: SMALL BOWEL RESECTION;  Surgeon: Judeth Horn, MD;  Location: [facility];  Service: General;  Laterality: N/A;   CARDIAC CATHETERIZATION  2005   no obstructive CAD per patient   CARDIAC CATHETERIZATION  2018   CARDIAC DEFIBRILLATOR PLACEMENT  2005   BiV ICD implanted,  LV lead is an epicardial lead   CARPAL TUNNEL RELEASE Left 07/25/2014   Dr.Williamson    CARPAL TUNNEL RELEASE Right 04/08/2019   Procedure: RIGHT CARPAL TUNNEL RELEASE ENDOSCOPIC;  Surgeon: Milly Jakob, MD;  Location: [facility];  Service: Orthopedics;  Laterality: Right;   CATARACT EXTRACTION W/ INTRAOCULAR LENS  IMPLANT, BILATERAL Bilateral    COLONIC STENT PLACEMENT N/A 08/31/2017   Procedure: COLONIC STENT PLACEMENT;  Surgeon: Carol Ada, MD;  Location: [facility] [place];  Service: Endoscopy;  Laterality: N/A;   COLONOSCOPY     2010-2011 Dr.Kipreos    COLOSTOMY  12/2016   Archie Endo 01/26/2017   COLOSTOMY REVERSAL N/A 07/13/2017   Procedure: COLOSTOMY REVERSAL;  Surgeon: Judeth Horn, MD;  Location: [facility];  Service: General;  Laterality: N/A;   CYST REMOVAL HAND Right 05/2013   thumb   DILATION AND CURETTAGE OF UTERUS  X 5-6    ESOPHAGOGASTRODUODENOSCOPY (EGD) WITH PROPOFOL N/A 03/13/2020   Procedure: ESOPHAGOGASTRODUODENOSCOPY (EGD) WITH PROPOFOL;  Surgeon: Carol Ada, MD;  [facility];  Service: Endoscopy;  Laterality: N/A;   EXCISION MASS ABDOMINAL N/A 07/09/2018   Procedure: EXPLORATION OF  ABDOMINAL WOUND ERAS PATHWAY;  Surgeon: Judeth Horn,  MD;  Location: [facility];  Service: General;  Laterality: N/A;   FLEXIBLE SIGMOIDOSCOPY N/A 08/24/2017   Procedure: Beryle Quant;  Surgeon: Carol Ada, MD;  Location: [place] [place];  Service: Endoscopy;  Laterality: N/A;   FLEXIBLE SIGMOIDOSCOPY N/A 08/31/2017   Procedure: FLEXIBLE SIGMOIDOSCOPY;  Surgeon: Carol Ada, MD;  Location: [place];  Service: Endoscopy;  Laterality: N/A;  stent placement   FLEXIBLE SIGMOIDOSCOPY N/A 03/13/2020   Procedure: FLEXIBLE SIGMOIDOSCOPY;  Surgeon: Carol Ada, MD;  [facility];  Service: Endoscopy;  Laterality: N/A;   FLEXIBLE SIGMOIDOSCOPY N/A 12/18/2020   Procedure: FLEXIBLE SIGMOIDOSCOPY;  Surgeon: Carol Ada, MD;  [facility];  Service: Endoscopy;  Laterality: N/A;   HEMOSTASIS CLIP PLACEMENT  03/13/2020   Procedure: HEMOSTASIS CLIP PLACEMENT;  Surgeon: Carol Ada, MD;  [facility];  Service: Endoscopy;;   HEMOSTASIS CLIP PLACEMENT  12/18/2020   Procedure: HEMOSTASIS CLIP PLACEMENT;  Surgeon: Carol Ada, MD;  [facility];  Service: Endoscopy;;   HOT HEMOSTASIS N/A 03/13/2020   Procedure: HOT HEMOSTASIS (ARGON PLASMA COAGULATION/BICAP);  Surgeon: Carol Ada, MD;  [facility];  Service: Endoscopy;  Laterality: N/A;   HOT HEMOSTASIS N/A 12/18/2020   Procedure: HOT HEMOSTASIS (ARGON PLASMA COAGULATION/BICAP);  Surgeon: Carol Ada, MD;  Location[facility];  Service: Endoscopy;  Laterality: N/A;   IMPLANTABLE CARDIOVERTER DEFIBRILLATOR GENERATOR CHANGE  2008   INSERTION OF MESH N/A 07/09/2018   Procedure: INSERTION OF VICRYL MESH;  Surgeon:  Judeth Horn, MD;  Location: [facility];  Service: General;  Laterality: N/A;   LAPAROTOMY N/A 09/18/2017   Procedure: EXPLORATORY LAPAROTOMY;  Surgeon: Judeth Horn, MD;  Location: [place];  Service: General;  Laterality: N/A;   LEAD REVISION  06/22/2018   LEAD REVISION/REPAIR N/A 06/22/2018   Procedure: LEAD REVISION/REPAIR;  Surgeon: Deboraha Sprang, MD;  Location: [facility];  Service: Cardiovascular;  Laterality: N/A;   LYSIS OF ADHESION N/A 09/18/2017   Procedure: LYSIS OF ADHESION;  Surgeon: Judeth Horn, MD;  Location: [facility];  Service: General;  Laterality: N/A;   LYSIS OF ADHESION N/A 07/09/2018   Procedure: LYSIS OF ADHESION;  Surgeon: Judeth Horn, MD;  Location: [facility];  Service: General;  Laterality: N/A;   PARTIAL COLECTOMY N/A 09/18/2017   Procedure: ILEOCOLECTOMY;  Surgeon: Judeth Horn, MD;  Location: [facility];  Service: General;  Laterality: N/A;   PARTIAL COLECTOMY  09/25/2017   POLYPECTOMY  03/13/2020   Procedure: POLYPECTOMY;  Surgeon: Carol Ada, MD;  [facility];  Service: Endoscopy;;   RIGHT/LEFT HEART CATH AND CORONARY ANGIOGRAPHY N/A 06/01/2018   Procedure: RIGHT/LEFT HEART CATH AND CORONARY ANGIOGRAPHY;  Surgeon: Jettie Booze, MD;  Location: [facility];  Service: Cardiovascular;  Laterality: N/A;   SIGMOIDOSCOPY N/A 09/18/2017   Procedure: SIGMOIDOSCOPY;  Surgeon: Judeth Horn, MD;  Location: [facility];  Service: General;  Laterality: N/A;   SMALL INTESTINE SURGERY  07/09/2018   EXPLORATION OF  ABDOMINAL WOUND ERAS PATHWAYN/; MESH; LYSIS OF ADHESIONS   TOTAL ABDOMINAL HYSTERECTOMY  1990   TRANSFORAMINAL LUMBAR INTERBODY FUSION (TLIF) WITH PEDICLE SCREW FIXATION 2 LEVEL N/A 01/07/2020   Procedure: Lumbar Four-Five and Lumbar Five-Sacral One open lumbar decompression and transforaminal lumbar interbody fusion;  Surgeon: Judith Part, MD;  Location: [place];  Service: Neurosurgery;  Laterality: N/A;  Lumbar Four-Five and Lumbar Five-Sacral One open  lumbar decompression and transforaminal lumbar interbody fusion   TRIGGER FINGER RELEASE Right 05/213   TRIGGER FINGER RELEASE Right 04/08/2019  Procedure: RIGHT INDEX RELEASE TRIGGER FINGER/A-1 PULLEY;  Surgeon: Milly Jakob, MD;  Location: [facility];  Service: Orthopedics;  Laterality: Right;   TUBAL LIGATION  1980s   VESICOVAGINAL FISTULA CLOSURE W/ TAH     Past Surgical History:  Procedure Laterality Date   APPENDECTOMY  07/13/2017   BLADDER SUSPENSION  1990   "w/hysterectomy"   BOWEL RESECTION N/A 07/09/2018   Procedure: SMALL BOWEL RESECTION;  Surgeon: Judeth Horn, MD;  Location: [facility];  Service: General;  Laterality: N/A;   CARDIAC CATHETERIZATION  2005   no obstructive CAD per patient   CARDIAC CATHETERIZATION  2018   CARDIAC DEFIBRILLATOR PLACEMENT  2005   BiV ICD implanted,  LV lead is an epicardial lead   CARPAL TUNNEL RELEASE Left 07/25/2014   Dr.Williamson    CARPAL TUNNEL RELEASE Right 04/08/2019   Procedure: RIGHT CARPAL TUNNEL RELEASE ENDOSCOPIC;  Surgeon: Milly Jakob, MD;  Location: [facility];  Service: Orthopedics;  Laterality: Right;   CATARACT EXTRACTION W/ INTRAOCULAR LENS  IMPLANT, BILATERAL Bilateral    COLONIC STENT PLACEMENT N/A 08/31/2017   Procedure: COLONIC STENT PLACEMENT;  Surgeon: Carol Ada, MD;  Location: [facility];  Service: Endoscopy;  Laterality: N/A;   COLONOSCOPY     2010-2011 Dr.Kipreos    COLOSTOMY  12/2016   Archie Endo 01/26/2017   COLOSTOMY REVERSAL N/A 07/13/2017   Procedure: COLOSTOMY REVERSAL;  Surgeon: Judeth Horn, MD;  Location: [facility];  Service: General;  Laterality: N/A;   CYST REMOVAL HAND Right 05/2013   thumb   DILATION AND CURETTAGE OF UTERUS  X 5-6   ESOPHAGOGASTRODUODENOSCOPY (EGD) WITH PROPOFOL N/A 03/13/2020   Procedure: ESOPHAGOGASTRODUODENOSCOPY (EGD) WITH PROPOFOL;  Surgeon: Carol Ada, MD;  [facility];  Service: Endoscopy;  Laterality: N/A;   EXCISION MASS ABDOMINAL N/A 07/09/2018   Procedure: EXPLORATION  OF  ABDOMINAL WOUND ERAS PATHWAY;  Surgeon: Judeth Horn, MD;  Location: [facility];  Service: General;  Laterality: N/A;   FLEXIBLE SIGMOIDOSCOPY N/A 08/24/2017   Procedure: Beryle Quant;  Surgeon: Carol Ada, MD;  Location: [facility];  Service: Endoscopy;  Laterality: N/A;   FLEXIBLE SIGMOIDOSCOPY N/A 08/31/2017   Procedure: FLEXIBLE SIGMOIDOSCOPY;  Surgeon: Carol Ada, MD;  Location: [facility];  Service: Endoscopy;  Laterality: N/A;  stent placement   FLEXIBLE SIGMOIDOSCOPY N/A 03/13/2020   Procedure: FLEXIBLE SIGMOIDOSCOPY;  Surgeon: Carol Ada, MD;  [facility];  Service: Endoscopy;  Laterality: N/A;   FLEXIBLE SIGMOIDOSCOPY N/A 12/18/2020   Procedure: FLEXIBLE SIGMOIDOSCOPY;  Surgeon: Carol Ada, MD;  [facility];  Service: Endoscopy;  Laterality: N/A;   HEMOSTASIS CLIP PLACEMENT  03/13/2020   Procedure: HEMOSTASIS CLIP PLACEMENT;  Surgeon: Carol Ada, MD;  [facility];  Service: Endoscopy;;   HEMOSTASIS CLIP PLACEMENT  12/18/2020   Procedure: HEMOSTASIS CLIP PLACEMENT;  Surgeon: Carol Ada, MD;  [facility];  Service: Endoscopy;;   HOT HEMOSTASIS N/A 03/13/2020   Procedure: HOT HEMOSTASIS (ARGON PLASMA COAGULATION/BICAP);  Surgeon: Carol Ada, MD;  [facility];  Service: Endoscopy;  Laterality: N/A;   HOT HEMOSTASIS N/A 12/18/2020   Procedure: HOT HEMOSTASIS (ARGON PLASMA COAGULATION/BICAP);  Surgeon: Carol Ada, MD;  [facility];  Service: Endoscopy;  Laterality: N/A;   IMPLANTABLE CARDIOVERTER DEFIBRILLATOR GENERATOR CHANGE  2008   INSERTION OF MESH N/A 07/09/2018   Procedure: INSERTION OF VICRYL MESH;  Surgeon: Judeth Horn, MD;  Location: [place];  Service: General;  Laterality: N/A;   LAPAROTOMY N/A 09/18/2017   Procedure: EXPLORATORY LAPAROTOMY;  Surgeon: Hulen Skains,  Jeneen Rinks, MD;  Location: [facility];  Service: General;  Laterality: N/A;   LEAD REVISION  06/22/2018   LEAD REVISION/REPAIR N/A 06/22/2018    Procedure: LEAD REVISION/REPAIR;  Surgeon: Deboraha Sprang, MD;  Location: [facility];  Service: Cardiovascular;  Laterality: N/A;   LYSIS OF ADHESION N/A 09/18/2017   Procedure: LYSIS OF ADHESION;  Surgeon: Judeth Horn, MD;  Location: [facility];  Service: General;  Laterality: N/A;   LYSIS OF ADHESION N/A 07/09/2018   Procedure: LYSIS OF ADHESION;  Surgeon: Judeth Horn, MD;  Location: [facility];  Service: General;  Laterality: N/A;   PARTIAL COLECTOMY N/A 09/18/2017   Procedure: ILEOCOLECTOMY;  Surgeon: Judeth Horn, MD;  Location: [facility];  Service: General;  Laterality: N/A;   PARTIAL COLECTOMY  09/25/2017   POLYPECTOMY  03/13/2020   Procedure: POLYPECTOMY;  Surgeon: Carol Ada, MD;  [facility];  Service: Endoscopy;;   RIGHT/LEFT HEART CATH AND CORONARY ANGIOGRAPHY N/A 06/01/2018   Procedure: RIGHT/LEFT HEART CATH AND CORONARY ANGIOGRAPHY;  Surgeon: Jettie Booze, MD;  Location: [facility];  Service: Cardiovascular;  Laterality: N/A;   SIGMOIDOSCOPY N/A 09/18/2017   Procedure: SIGMOIDOSCOPY;  Surgeon: Judeth Horn, MD;  Location: [facility];  Service: General;  Laterality: N/A;   SMALL INTESTINE SURGERY  07/09/2018   EXPLORATION OF  ABDOMINAL WOUND ERAS PATHWAYN/; MESH; LYSIS OF ADHESIONS   TOTAL ABDOMINAL HYSTERECTOMY  1990   TRANSFORAMINAL LUMBAR INTERBODY FUSION (TLIF) WITH PEDICLE SCREW FIXATION 2 LEVEL N/A 01/07/2020   Procedure: Lumbar Four-Five and Lumbar Five-Sacral One open lumbar decompression and transforaminal lumbar interbody fusion;  Surgeon: Judith Part, MD;  Location: [place];  Service: Neurosurgery;  Laterality: N/A;  Lumbar Four-Five and Lumbar Five-Sacral One open lumbar decompression and transforaminal lumbar interbody fusion   TRIGGER FINGER RELEASE Right 05/213   TRIGGER FINGER RELEASE Right 04/08/2019   Procedure: RIGHT INDEX RELEASE TRIGGER FINGER/A-1 PULLEY;  Surgeon: Milly Jakob, MD;  Location: [place] [facility];  Service: Orthopedics;   Laterality: Right;   TUBAL LIGATION  1980s   VESICOVAGINAL FISTULA CLOSURE W/ TAH     Past Medical History:  Diagnosis Date   AICD (automatic cardioverter/defibrillator) present    high RV threshold chronically, device was turned off in 2014; Device battery has been dead x 7 years; "turned it back on 06/22/2018"   Anemia, iron deficiency    Angioedema    felt to likely be due to ace inhibitors but says she has had this even off of medicines, appears to be tolerating ARBs chronically   Anxiety    Arthritis    "hands, legs, arms; bad in my back" (06/22/2018)   Asthmatic bronchitis with status asthmaticus    Bradycardia    CHF (congestive heart failure) (HCC)    Chronic lower back pain    Chronic pain    CKD (chronic kidney disease), stage III (HCC)    Coronary artery disease    Mild nonobstructive (30% LAD, 30% RCA) by 09/01/16 cath at [facility]   Degenerative joint disease    Depression    Diabetes mellitus, type 2 ([place])    Diabetic peripheral neuropathy ([place])    Dizziness    Dyspnea on exertion    Family history of adverse reaction to anesthesia    Mother has nausea   GERD (gastroesophageal reflux disease)    Gout    Heart valve disorder    History of blood transfusion 1981; ~ 2005; 12/2016   "childbirth; defibrillator OR; colostomy  OR"   History of mononucleosis 03/2014   Hyperlipidemia    Hypertension    Hypothyroid    Insomnia    Intervertebral disc degeneration    Ischemic cardiomyopathy    LBBB (left bundle branch block)    Myocardial infarction ([place]) dx'd ~ 2005   Nonischemic cardiomyopathy ([place])    s/p ICD in 2005. EF has since recovered   Obesity    On home oxygen therapy    "2L at night" (06/22/2018)   OSA on CPAP    Pneumonia    "couple times; last time was 12/2016" (06/22/2018)   PONV (postoperative nausea and vomiting)    Presence of permanent cardiac pacemaker    [employer]   Swelling    Syncope    Systemic hypertension    Vitamin D  deficiency    BP 138/75   Pulse 75   Ht '5\' 4"'$  (1.626 m)   Wt 248 lb (112.5 kg)   SpO2 95%   BMI 42.57 kg/m   Opioid Risk Score:   Fall Risk Score:  `1  Depression screen [facility] 2/9     01/03/2022   11:24 AM 12/29/2021   10:23 AM 11/24/2021   11:24 AM 10/26/2021   10:22 AM 09/28/2021   10:20 AM 08/25/2021   10:06 AM 07/27/2021   11:01 AM  Depression screen [facility] 2/9  Decreased Interest 0 0 0 0 0 0 0  Down, Depressed, Hopeless 0 0 0 0 0 0 0  PHQ - 2 Score 0 0 0 0 0 0 0     Review of Systems  Musculoskeletal:  Positive for back pain and gait problem.       Pain in both knees and both lower arms  All other systems reviewed and are negative.      Objective:   Physical Exam Vitals and nursing note reviewed.  Constitutional:      Appearance: Normal appearance. She is obese.  Cardiovascular:     Rate and Rhythm: Normal rate and regular rhythm.     Pulses: Normal pulses.     Heart sounds: Normal heart sounds.  Pulmonary:     Effort: Pulmonary effort is normal.     Breath sounds: Normal breath sounds.  Musculoskeletal:     Cervical back: Normal range of motion and neck supple.     Comments: Normal Muscle Bulk and Muscle Testing Reveals:  Upper Extremities: Full ROM and Muscle Strength 5/5  Lumbar Paraspinal Tenderness: L-4-L-5 Lower Extremities: Full ROM and Muscle Strength 5/5 Right Lower Extremity Flexion Produces Pain into her Right Patella Arises from Chair slowly using cane for support Antalgic  Gait     Skin:    General: Skin is warm and dry.  Neurological:     Mental Status: She is alert and oriented to person, place, and time.  Psychiatric:        Mood and Affect: Mood normal.        Behavior: Behavior normal.         Assessment & Plan:  Chronic Low Back Pain: Encouraged to increase HEP as Tolerated. Continue to Monitor.  01/26/2022 Bilateral Primary Osteoarthritis:  S/P Right Knee Genicular nerve radiofrequency neurotomy, with Dr Letta Pate. On 09/28/2021 and S/P  Left Knee Genicular Nerve Radiofrequency on 10/05/2021. With good relief noted.   Indication Chronic post operative pain in the Knee, pain postop total knee replacement which has not responded to conservative management such as physical therapy and medication management; Continue monitor. 01/26/2022 3.  Myofascial Pain: S/P Trigger Point Injection on 06/29/2021 with Good Relief Noted. 01/26/2022 4. Chronic Pain Syndrome: Refilled:Increased:  Hydrocodone 10/325 mg one tablet three times  a day as needed for pain #80. We will continue the opioid monitoring program, this consists of regular clinic visits, examinations, urine drug screen, pill counts as well as use of [place] Controlled Substance Reporting system. A 12 month History has been reviewed on the [place] Controlled Substance Reporting System on 01/26/2022.   F/U in 1 month

## 2022-01-26 NOTE — Patient Instructions (Addendum)
Medication Instructions:  Your physician recommends that you continue on your current medications as directed. Please refer to the Current Medication list given to you today.  *If you need a refill on your cardiac medications before your next appointment, please call your pharmacy*  Lab Work: If you have labs (blood work) drawn today and your tests are completely normal, you will receive your results only by: [place] (if you have MyChart) OR A paper copy in the mail If you have any lab test that is abnormal or we need to change your treatment, we will call you to review the results.  Testing/Procedures: None ordered today.  Follow-Up: At [facility], you and your health needs are our priority.  As part of our continuing mission to provide you with exceptional heart care, we have created designated Provider Care Teams.  These Care Teams include your primary Cardiologist (physician) and Advanced Practice Providers (APPs -  Physician Assistants and Nurse Practitioners) who all work together to provide you with the care you need, when you need it.  We recommend signing up for the patient portal called "MyChart".  Sign up information is provided on this After Visit Summary.  MyChart is used to connect with patients for Virtual Visits (Telemedicine).  Patients are able to view lab/test results, encounter notes, upcoming appointments, etc.  Non-urgent messages can be sent to your provider as well.   To learn more about what you can do with MyChart, go to [email or web address].    Your next appointment:   12 month(s)  The format for your next appointment:   In Person  Provider:   Larae Grooms, MD {   Important Information About [place]

## 2022-02-02 DIAGNOSIS — N184 Chronic kidney disease, stage 4 (severe): Secondary | ICD-10-CM | POA: Diagnosis not present

## 2022-02-03 ENCOUNTER — Other Ambulatory Visit: Payer: Self-pay | Admitting: *Deleted

## 2022-02-03 NOTE — Patient Outreach (Signed)
  Care Coordination   Follow Up Visit Note   02/03/2022 Name: Crystal [name] MRN: [id number] DOB: [birth year]  Crystal [name] is a 71 y.o. year old female who sees Crist Infante, MD for primary care. I spoke with  Crystal [name] by phone today  What matters to the patients health and wellness today?  Diabetic device not staying in place for daily readings due to lack of adhesiveness.    Goals Addressed             This Visit's Progress    Diabetic monitor system not staying in place with adhesiveness       Care Coordination Interventions: Advised patient to research available sleeves to assist with keeping her Libra monitor in place to her arm section. Pt states she sweats profusely and it is difficult to use an adhesive as suggestive by her provider's office to keep the device in place. Pt will try a sleeve as RN research availability via website to assist with her research. Pt will discuss with her daughter prior to ordering.  Other alternative is to use her existing device if supplies are available to continue her regimen with checking her glucose. Pt as indicated [employer] (insurance carrier) no longer provides coverage for her Accucheck monitoring and she may need to change devices if the sleeve does not assist.          SDOH assessments and interventions completed:   No This was reviewed last month.   Care Coordination Interventions Activated:  Yes Care Coordination Interventions:   Yes, provided  Follow up plan:  in few weeks.  Encounter Outcome:  Pt. Visit Completed  Further assessment all gaps of care reviewed: eye appt-Sept, Colonoscopy- completed 2022, Shringrix-pending 2nd shot next month and Tetanus pt will inquire on next office visit on booster.  Raina Mina, RN Care Management Coordinator [place] Office [phone]

## 2022-02-10 DIAGNOSIS — I129 Hypertensive chronic kidney disease with stage 1 through stage 4 chronic kidney disease, or unspecified chronic kidney disease: Secondary | ICD-10-CM | POA: Diagnosis not present

## 2022-02-10 DIAGNOSIS — N2581 Secondary hyperparathyroidism of renal origin: Secondary | ICD-10-CM | POA: Diagnosis not present

## 2022-02-10 DIAGNOSIS — I5042 Chronic combined systolic (congestive) and diastolic (congestive) heart failure: Secondary | ICD-10-CM | POA: Diagnosis not present

## 2022-02-10 DIAGNOSIS — N184 Chronic kidney disease, stage 4 (severe): Secondary | ICD-10-CM | POA: Diagnosis not present

## 2022-02-10 DIAGNOSIS — E1122 Type 2 diabetes mellitus with diabetic chronic kidney disease: Secondary | ICD-10-CM | POA: Diagnosis not present

## 2022-02-10 DIAGNOSIS — D509 Iron deficiency anemia, unspecified: Secondary | ICD-10-CM | POA: Diagnosis not present

## 2022-02-24 ENCOUNTER — Encounter: Payer: Self-pay | Admitting: *Deleted

## 2022-03-08 ENCOUNTER — Encounter: Payer: Self-pay | Admitting: Physical Medicine & Rehabilitation

## 2022-03-08 ENCOUNTER — Encounter: Payer: Medicare Other | Attending: Registered Nurse | Admitting: Physical Medicine & Rehabilitation

## 2022-03-08 VITALS — BP 100/64 | HR 75 | Ht 64.0 in | Wt 245.8 lb

## 2022-03-08 DIAGNOSIS — Z5181 Encounter for therapeutic drug level monitoring: Secondary | ICD-10-CM | POA: Insufficient documentation

## 2022-03-08 DIAGNOSIS — G894 Chronic pain syndrome: Secondary | ICD-10-CM | POA: Diagnosis not present

## 2022-03-08 DIAGNOSIS — Z79891 Long term (current) use of opiate analgesic: Secondary | ICD-10-CM | POA: Insufficient documentation

## 2022-03-08 MED ORDER — HYDROCODONE-ACETAMINOPHEN 10-325 MG PO TABS: 1.0000 | ORAL_TABLET | Freq: Three times a day (TID) | ORAL | 0 refills | Status: DC | PRN

## 2022-03-08 NOTE — Progress Notes (Signed)
Subjective:    Patient ID: Crystal [name], female    DOB: November 03, 1950, 71 y.o.   MRN: [id number]  HPI 71 year old female with history of bilateral knee pain related to osteoarthritis.  She is doing reasonably well in regards to this complaint following genicular nerve radiofrequency neurotomy performed in March 2023 RIght side genic RF reduced pain ~40% and left side 80% , still working  The patient also has a history of chronic low back pain and has lumbar CT myelogram demonstrating severe stenosis at L4-5 on the study dated 11/22/2019.  The patient has had no new weakness in the lower extremities no new bowel or bladder dysfunction. She has chronic bowel issues with history of peritonitis requiring colostomy.  She has undergone colostomy reversal about 4.5 years ago. Takes 2-3 hydrocodone per day has side effect of itching not as bad when she was on tramadol  Bowels moving ok, has hx of bowel obstruction even prior to being on narcotic analgesic   Klonopin taken from daughter when nerves acted up when she had a drunk visitor.  We discussed that she is getting a urine drug screen today and if it shows a positive she will need more frequent monitoring and if positive for second time, no further narcotic analgesic could be prescribed Pain Inventory Average Pain 9 Pain Right Now 7 My pain is sharp, burning, stabbing, and aching  In the last 24 hours, has pain interfered with the following? General activity 7 Relation with others 7 Enjoyment of life 7 What TIME of day is your pain at its worst? morning  and evening Sleep (in general) Good  Pain is worse with: walking, bending, standing, and some activites Pain improves with: rest, medication, and injections Relief from Meds: 7  Family History  Problem Relation Age of Onset   Hypertension Father    Heart disease Father    Cancer Father        prostate   Heart disease Other        (Maternal side) Ischemic heart disease   Diabetes  Mellitus II Other    Arrhythmia Mother    Diabetes Mellitus II Mother        Borderline DM   Hypertension Mother    Asthma Mother    Cancer Brother    Dementia Brother    Hypertension Brother    Hypertension Daughter    Hypertension Daughter    Diabetes Daughter    Hypertension Daughter    Atrial fibrillation Daughter    GER disease Daughter    Hypertension Son    Anxiety disorder Son    Hypothyroidism Son    Breast cancer Maternal Grandmother        in her 20's   Social History   Socioeconomic History   Marital status: Widowed    Spouse name: Not on file   Number of children: Not on file   Years of education: Not on file   Highest education level: Not on file  Occupational History    Comment: retired LPN  Tobacco Use   Smoking status: Never   Smokeless tobacco: Never  Vaping Use   Vaping Use: Never used  Substance and Sexual Activity   Alcohol use: Not Currently   Drug use: Never   Sexual activity: Not Currently  Other Topics Concern   Not on file  Social History Narrative   Pt lives in [place] alone.  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      As of 03/15/17:   Diet: 1800 Calorie      Caffeine: Yes      Married, if yes what year: Widowed, married 1973      Do you live in a house, apartment, assisted living, condo, trailer, ect: House, 1 stories, and 1 person      Pets: No      Current/Past profession: LPN, retired       Exercise: Yes, walking          Living Will: Yes   DNR: No   POA/HPOA: Yes      Functional Status:   Do you have difficulty bathing or dressing yourself? No   Do you have difficulty preparing food or eating? No   Do you have difficulty managing your medications? No   Do you have difficulty managing your finances? No   Do you have difficulty affording your medications? Yes   Social Determinants of Health   Financial Resource Strain: Low Risk  (10/11/2017)   Overall Financial  Resource Strain (CARDIA)    Difficulty of Paying Living Expenses: Not hard at all  Food Insecurity: No Food Insecurity (01/03/2022)   Hunger Vital Sign    Worried About Running Out of Food in the Last Year: Never true    Ran Out of Food in the Last Year: Never true  Transportation Needs: No Transportation Needs (01/03/2022)   PRAPARE - Hydrologist (Medical): No    Lack of Transportation (Non-Medical): No  Physical Activity: Inactive (10/11/2017)   Exercise Vital Sign    Days of Exercise per Week: 0 days    Minutes of Exercise per Session: 0 min  Stress: Stress Concern Present (10/11/2017)   [place]    Feeling of Stress : To some extent  Social Connections: Somewhat Isolated (10/11/2017)   Social Connection and Isolation Panel [NHANES]    Frequency of Communication with Friends and Family: More than three times a week    Frequency of Social Gatherings with Friends and Family: More than three times a week    Attends Religious Services: More than 4 times per year    Active Member of [employer] or Organizations: No    Attends Archivist Meetings: Never    Marital Status: Widowed   Past Surgical History:  Procedure Laterality Date   APPENDECTOMY  07/13/2017   BLADDER SUSPENSION  1990   "w/hysterectomy"   BOWEL RESECTION N/A 07/09/2018   Procedure: SMALL BOWEL RESECTION;  Surgeon: Judeth Horn, MD;  Location: [place] [facility];  Service: General;  Laterality: N/A;   CARDIAC CATHETERIZATION  2005   no obstructive CAD per patient   CARDIAC CATHETERIZATION  2018   CARDIAC DEFIBRILLATOR PLACEMENT  2005   BiV ICD implanted,  LV lead is an epicardial lead   CARPAL TUNNEL RELEASE Left 07/25/2014   Dr.Williamson    CARPAL TUNNEL RELEASE Right 04/08/2019   Procedure: RIGHT CARPAL TUNNEL RELEASE ENDOSCOPIC;  Surgeon: Milly Jakob, MD;  Location: [facility];  Service: Orthopedics;  Laterality: Right;    CATARACT EXTRACTION W/ INTRAOCULAR LENS  IMPLANT, BILATERAL Bilateral    COLONIC STENT PLACEMENT N/A 08/31/2017   Procedure: COLONIC STENT PLACEMENT;  Surgeon: Carol Ada, MD;  Location: [facility];  Service: Endoscopy;  Laterality: N/A;   COLONOSCOPY     2010-2011 Dr.Kipreos    COLOSTOMY  12/2016   Archie Endo 01/26/2017   COLOSTOMY REVERSAL  N/A 07/13/2017   Procedure: COLOSTOMY REVERSAL;  Surgeon: Judeth Horn, MD;  Location: [facility];  Service: General;  Laterality: N/A;   CYST REMOVAL HAND Right 05/2013   thumb   DILATION AND CURETTAGE OF UTERUS  X 5-6   ESOPHAGOGASTRODUODENOSCOPY (EGD) WITH PROPOFOL N/A 03/13/2020   Procedure: ESOPHAGOGASTRODUODENOSCOPY (EGD) WITH PROPOFOL;  Surgeon: Carol Ada, MD;  [facility];  Service: Endoscopy;  Laterality: N/A;   EXCISION MASS ABDOMINAL N/A 07/09/2018   Procedure: EXPLORATION OF  ABDOMINAL WOUND ERAS PATHWAY;  Surgeon: Judeth Horn, MD;  Location: [facility];  Service: General;  Laterality: N/A;   FLEXIBLE SIGMOIDOSCOPY N/A 08/24/2017   Procedure: Beryle Quant;  Surgeon: Carol Ada, MD;  Location: [facility];  Service: Endoscopy;  Laterality: N/A;   FLEXIBLE SIGMOIDOSCOPY N/A 08/31/2017   Procedure: FLEXIBLE SIGMOIDOSCOPY;  Surgeon: Carol Ada, MD;  Location: [place];  Service: Endoscopy;  Laterality: N/A;  stent placement   FLEXIBLE SIGMOIDOSCOPY N/A 03/13/2020   Procedure: FLEXIBLE SIGMOIDOSCOPY;  Surgeon: Carol Ada, MD;  [facility];  Service: Endoscopy;  Laterality: N/A;   FLEXIBLE SIGMOIDOSCOPY N/A 12/18/2020   Procedure: FLEXIBLE SIGMOIDOSCOPY;  Surgeon: Carol Ada, MD;  [facility];  Service: Endoscopy;  Laterality: N/A;   HEMOSTASIS CLIP PLACEMENT  03/13/2020   Procedure: HEMOSTASIS CLIP PLACEMENT;  Surgeon: Carol Ada, MD;  [facility];  Service: Endoscopy;;   HEMOSTASIS CLIP PLACEMENT  12/18/2020   Procedure: HEMOSTASIS CLIP PLACEMENT;  Surgeon: Carol Ada, MD;  [facility];  Service: Endoscopy;;   HOT HEMOSTASIS N/A 03/13/2020   Procedure: HOT HEMOSTASIS (ARGON PLASMA COAGULATION/BICAP);  Surgeon: Carol Ada, MD;  [facility];  Service: Endoscopy;  Laterality: N/A;   HOT HEMOSTASIS N/A 12/18/2020   Procedure: HOT HEMOSTASIS (ARGON PLASMA COAGULATION/BICAP);  Surgeon: Carol Ada, MD;  Location[facility];  Service: Endoscopy;  Laterality: N/A;   IMPLANTABLE CARDIOVERTER DEFIBRILLATOR GENERATOR CHANGE  2008   INSERTION OF MESH N/A 07/09/2018   Procedure: INSERTION OF VICRYL MESH;  Surgeon: Judeth Horn, MD;  Location: [place] [facility];  Service: General;  Laterality: N/A;   LAPAROTOMY N/A 09/18/2017   Procedure: EXPLORATORY LAPAROTOMY;  Surgeon: Judeth Horn, MD;  Location: [facility];  Service: General;  Laterality: N/A;   LEAD REVISION  06/22/2018   LEAD REVISION/REPAIR N/A 06/22/2018   Procedure: LEAD REVISION/REPAIR;  Surgeon: Deboraha Sprang, MD;  Location: [facility];  Service: Cardiovascular;  Laterality: N/A;   LYSIS OF ADHESION N/A 09/18/2017   Procedure: LYSIS OF ADHESION;  Surgeon: Judeth Horn, MD;  Location: [place] [facility];  Service: General;  Laterality: N/A;   LYSIS OF ADHESION N/A 07/09/2018   Procedure: LYSIS OF ADHESION;  Surgeon: Judeth Horn, MD;  Location: [facility];  Service: General;  Laterality: N/A;   PARTIAL COLECTOMY N/A 09/18/2017   Procedure: ILEOCOLECTOMY;  Surgeon: Judeth Horn, MD;  Location: [facility];  Service: General;  Laterality: N/A;   PARTIAL COLECTOMY  09/25/2017   POLYPECTOMY  03/13/2020   Procedure: POLYPECTOMY;  Surgeon: Carol Ada, MD;  [facility];  Service: Endoscopy;;   RIGHT/LEFT HEART CATH AND CORONARY ANGIOGRAPHY N/A 06/01/2018   Procedure: RIGHT/LEFT HEART CATH AND CORONARY ANGIOGRAPHY;  Surgeon: Jettie Booze, MD;  Location: [facility];  Service: Cardiovascular;  Laterality: N/A;   SIGMOIDOSCOPY N/A 09/18/2017   Procedure: SIGMOIDOSCOPY;  Surgeon: Judeth Horn, MD;  Location: [facility];   Service: General;  Laterality: N/A;   SMALL INTESTINE SURGERY  07/09/2018   EXPLORATION OF  ABDOMINAL WOUND ERAS PATHWAYN/; MESH; LYSIS OF ADHESIONS   TOTAL ABDOMINAL HYSTERECTOMY  1990   TRANSFORAMINAL LUMBAR INTERBODY FUSION (TLIF) WITH PEDICLE SCREW FIXATION 2 LEVEL N/A 01/07/2020   Procedure: Lumbar Four-Five and Lumbar Five-Sacral One open lumbar decompression and transforaminal lumbar interbody fusion;  Surgeon: Judith Part, MD;  Location: [place] [facility];  Service: Neurosurgery;  Laterality: N/A;  Lumbar Four-Five and Lumbar Five-Sacral One open lumbar decompression and transforaminal lumbar interbody fusion   TRIGGER FINGER RELEASE Right 05/213   TRIGGER FINGER RELEASE Right 04/08/2019   Procedure: RIGHT INDEX RELEASE TRIGGER FINGER/A-1 PULLEY;  Surgeon: Milly Jakob, MD;  Location: [facility];  Service: Orthopedics;  Laterality: Right;   TUBAL LIGATION  1980s   VESICOVAGINAL FISTULA CLOSURE W/ TAH     Past Surgical History:  Procedure Laterality Date   APPENDECTOMY  07/13/2017   BLADDER SUSPENSION  1990   "w/hysterectomy"   BOWEL RESECTION N/A 07/09/2018   Procedure: SMALL BOWEL RESECTION;  Surgeon: Judeth Horn, MD;  Location: [facility];  Service: General;  Laterality: N/A;   CARDIAC CATHETERIZATION  2005   no obstructive CAD per patient   CARDIAC CATHETERIZATION  2018   CARDIAC DEFIBRILLATOR PLACEMENT  2005   BiV ICD implanted,  LV lead is an epicardial lead   CARPAL TUNNEL RELEASE Left 07/25/2014   Dr.Williamson    CARPAL TUNNEL RELEASE Right 04/08/2019   Procedure: RIGHT CARPAL TUNNEL RELEASE ENDOSCOPIC;  Surgeon: Milly Jakob, MD;  Location: [place] [facility];  Service: Orthopedics;  Laterality: Right;   CATARACT EXTRACTION W/ INTRAOCULAR LENS  IMPLANT, BILATERAL Bilateral    COLONIC STENT PLACEMENT N/A 08/31/2017   Procedure: COLONIC STENT PLACEMENT;  Surgeon: Carol Ada, MD;  Location: [facility];  Service: Endoscopy;  Laterality: N/A;   COLONOSCOPY     2010-2011 Dr.Kipreos     COLOSTOMY  12/2016   Archie Endo 01/26/2017   COLOSTOMY REVERSAL N/A 07/13/2017   Procedure: COLOSTOMY REVERSAL;  Surgeon: Judeth Horn, MD;  Location: [facility];  Service: General;  Laterality: N/A;   CYST REMOVAL HAND Right 05/2013   thumb   DILATION AND CURETTAGE OF UTERUS  X 5-6   ESOPHAGOGASTRODUODENOSCOPY (EGD) WITH PROPOFOL N/A 03/13/2020   Procedure: ESOPHAGOGASTRODUODENOSCOPY (EGD) WITH PROPOFOL;  Surgeon: Carol Ada, MD;  [facility];  Service: Endoscopy;  Laterality: N/A;   EXCISION MASS ABDOMINAL N/A 07/09/2018   Procedure: EXPLORATION OF  ABDOMINAL WOUND ERAS PATHWAY;  Surgeon: Judeth Horn, MD;  Location: [facility];  Service: General;  Laterality: N/A;   FLEXIBLE SIGMOIDOSCOPY N/A 08/24/2017   Procedure: Beryle Quant;  Surgeon: Carol Ada, MD;  Location: [facility];  Service: Endoscopy;  Laterality: N/A;   FLEXIBLE SIGMOIDOSCOPY N/A 08/31/2017   Procedure: FLEXIBLE SIGMOIDOSCOPY;  Surgeon: Carol Ada, MD;  Location: [facility];  Service: Endoscopy;  Laterality: N/A;  stent placement   FLEXIBLE SIGMOIDOSCOPY N/A 03/13/2020   Procedure: FLEXIBLE SIGMOIDOSCOPY;  Surgeon: Carol Ada, MD;  [facility];  Service: Endoscopy;  Laterality: N/A;   FLEXIBLE SIGMOIDOSCOPY N/A 12/18/2020   Procedure: FLEXIBLE SIGMOIDOSCOPY;  Surgeon: Carol Ada, MD;  [facility];  Service: Endoscopy;  Laterality: N/A;   HEMOSTASIS CLIP PLACEMENT  03/13/2020   Procedure: HEMOSTASIS CLIP PLACEMENT;  Surgeon: Carol Ada, MD;  [facility];  Service: Endoscopy;;   HEMOSTASIS CLIP PLACEMENT  12/18/2020   Procedure: HEMOSTASIS CLIP PLACEMENT;  Surgeon: Carol Ada, MD;  [facility];  Service: Endoscopy;;   HOT HEMOSTASIS N/A 03/13/2020   Procedure: HOT HEMOSTASIS (ARGON PLASMA COAGULATION/BICAP);  Surgeon: Carol Ada, MD;  [facility];  Service: Endoscopy;  Laterality: N/A;   HOT HEMOSTASIS N/A 12/18/2020   Procedure: HOT HEMOSTASIS  (ARGON PLASMA COAGULATION/BICAP);  Surgeon: Carol Ada, MD;  Location[facility];  Service: Endoscopy;  Laterality: N/A;   IMPLANTABLE CARDIOVERTER DEFIBRILLATOR GENERATOR CHANGE  2008   INSERTION OF MESH N/A 07/09/2018   Procedure: INSERTION OF VICRYL MESH;  Surgeon: Judeth Horn, MD;  Location: [place];  Service: General;  Laterality: N/A;   LAPAROTOMY N/A 09/18/2017   Procedure: EXPLORATORY LAPAROTOMY;  Surgeon: Judeth Horn, MD;  Location: [facility];  Service: General;  Laterality: N/A;   LEAD REVISION  06/22/2018   LEAD REVISION/REPAIR N/A 06/22/2018   Procedure: LEAD REVISION/REPAIR;  Surgeon: Deboraha Sprang, MD;  Location: [facility];  Service: Cardiovascular;  Laterality: N/A;   LYSIS OF ADHESION N/A 09/18/2017   Procedure: LYSIS OF ADHESION;  Surgeon: Judeth Horn, MD;  Location: [place];  Service: General;  Laterality: N/A;   LYSIS OF ADHESION N/A 07/09/2018   Procedure: LYSIS OF ADHESION;  Surgeon: Judeth Horn, MD;  Location: [facility];  Service: General;  Laterality: N/A;   PARTIAL COLECTOMY N/A 09/18/2017   Procedure: ILEOCOLECTOMY;  Surgeon: Judeth Horn, MD;  Location: [facility];  Service: General;  Laterality: N/A;   PARTIAL COLECTOMY  09/25/2017   POLYPECTOMY  03/13/2020   Procedure: POLYPECTOMY;  Surgeon: Carol Ada, MD;  [facility];  Service: Endoscopy;;   RIGHT/LEFT HEART CATH AND CORONARY ANGIOGRAPHY N/A 06/01/2018   Procedure: RIGHT/LEFT HEART CATH AND CORONARY ANGIOGRAPHY;  Surgeon: Jettie Booze, MD;  Location: [facility];  Service: Cardiovascular;  Laterality: N/A;   SIGMOIDOSCOPY N/A 09/18/2017   Procedure: SIGMOIDOSCOPY;  Surgeon: Judeth Horn, MD;  Location: [facility];  Service: General;  Laterality: N/A;   SMALL INTESTINE SURGERY  07/09/2018   EXPLORATION OF  ABDOMINAL WOUND ERAS PATHWAYN/; MESH; LYSIS OF ADHESIONS   TOTAL ABDOMINAL HYSTERECTOMY  1990   TRANSFORAMINAL LUMBAR INTERBODY FUSION (TLIF) WITH PEDICLE SCREW FIXATION 2 LEVEL N/A  01/07/2020   Procedure: Lumbar Four-Five and Lumbar Five-Sacral One open lumbar decompression and transforaminal lumbar interbody fusion;  Surgeon: Judith Part, MD;  Location: [facility];  Service: Neurosurgery;  Laterality: N/A;  Lumbar Four-Five and Lumbar Five-Sacral One open lumbar decompression and transforaminal lumbar interbody fusion   TRIGGER FINGER RELEASE Right 05/213   TRIGGER FINGER RELEASE Right 04/08/2019   Procedure: RIGHT INDEX RELEASE TRIGGER FINGER/A-1 PULLEY;  Surgeon: Milly Jakob, MD;  Location: [facility];  Service: Orthopedics;  Laterality: Right;   TUBAL LIGATION  1980s   VESICOVAGINAL FISTULA CLOSURE W/ TAH     Past Medical History:  Diagnosis Date   AICD (automatic cardioverter/defibrillator) present    high RV threshold chronically, device was turned off in 2014; Device battery has been dead x 7 years; "turned it back on 06/22/2018"   Anemia, iron deficiency    Angioedema    felt to likely be due to ace inhibitors but says she has had this even off of medicines, appears to be tolerating ARBs chronically   Anxiety    Arthritis    "hands, legs, arms; bad in my back" (06/22/2018)   Asthmatic bronchitis with status asthmaticus    Bradycardia    CHF (congestive heart failure) (HCC)    Chronic lower back pain    Chronic pain    CKD (chronic kidney disease), stage III (HCC)    Coronary artery disease    Mild  nonobstructive (30% LAD, 30% RCA) by 09/01/16 cath at [facility]   Degenerative joint disease    Depression    Diabetes mellitus, type 2 ([place])    Diabetic peripheral neuropathy ([place])    Dizziness    Dyspnea on exertion    Family history of adverse reaction to anesthesia    Mother has nausea   GERD (gastroesophageal reflux disease)    Gout    Heart valve disorder    History of blood transfusion 1981; ~ 2005; 12/2016   "childbirth; defibrillator OR; colostomy OR"   History of mononucleosis 03/2014   Hyperlipidemia    Hypertension    Hypothyroid     Insomnia    Intervertebral disc degeneration    Ischemic cardiomyopathy    LBBB (left bundle branch block)    Myocardial infarction ([place]) dx'd ~ 2005   Nonischemic cardiomyopathy ([place])    s/p ICD in 2005. EF has since recovered   Obesity    On home oxygen therapy    "2L at night" (06/22/2018)   OSA on CPAP    Pneumonia    "couple times; last time was 12/2016" (06/22/2018)   PONV (postoperative nausea and vomiting)    Presence of permanent cardiac pacemaker    [employer]   Swelling    Syncope    Systemic hypertension    Vitamin D deficiency    BP 100/64   Pulse 75   Ht '5\' 4"'$  (1.626 m)   Wt 245 lb 12.8 oz (111.5 kg)   SpO2 92%   BMI 42.19 kg/m   Opioid Risk Score:   Fall Risk Score:  `1  Depression screen [facility] 2/9     03/08/2022   10:19 AM 01/26/2022    9:54 AM 01/03/2022   11:24 AM 12/29/2021   10:23 AM 11/24/2021   11:24 AM 10/26/2021   10:22 AM 09/28/2021   10:20 AM  Depression screen PHQ 2/9  Decreased Interest 0 0 0 0 0 0 0  Down, Depressed, Hopeless 0 0 0 0 0 0 0  PHQ - 2 Score 0 0 0 0 0 0 0     Review of Systems  Constitutional: Negative.   HENT: Negative.    Eyes: Negative.   Respiratory: Negative.    Cardiovascular: Negative.   Gastrointestinal: Negative.   Endocrine: Negative.   Genitourinary: Negative.   Musculoskeletal:  Positive for back pain and gait problem.  Skin: Negative.   Allergic/Immunologic: Negative.   Hematological: Negative.   Psychiatric/Behavioral: Negative.        Objective:   Physical Exam Vitals and nursing note reviewed.  Constitutional:      Appearance: She is obese.  HENT:     Head: Normocephalic and atraumatic.  Eyes:     Extraocular Movements: Extraocular movements intact.     Conjunctiva/sclera: Conjunctivae normal.     Pupils: Pupils are equal, round, and reactive to light.  Musculoskeletal:     Comments: No evidence of knee effusion bilaterally no pain to palpation No evidence of erythema.  Knee range of  motion is normal  Skin:    General: Skin is warm and dry.  Neurological:     Mental Status: She is alert and oriented to person, place, and time.     Comments: Lower extremity strength is 5/5 bilateral hip flexor knee extensor ankle dorsiflexor.  Psychiatric:        Mood and Affect: Mood normal.        Behavior: Behavior normal.  Assessment & Plan:  1.  Chronic knee pain history of osteoarthritis did well after genicular nerve blocks as well as genicular nerve RF she is approximately 5 months post.  No need for repeat at this time if her knee pain starts increasing would repeat on the more symptomatic side first. 2.  History of chronic low back pain lumbar spinal stenosis no significant radicular symptoms at this time.  Continue hydrocodone 10 mg 2 to 3 tablets/day We will need to check UDS if positive for Klonopin will need to check UDS more frequently and if positive once again for nonprescribed medication no longer would be able to prescribe narcotic analgesics.  This was discussed with the patient

## 2022-03-09 ENCOUNTER — Ambulatory Visit: Payer: Self-pay

## 2022-03-09 NOTE — Patient Outreach (Signed)
  Care Coordination   Follow Up Visit Note   03/09/2022 Name: GENESYS COGGESHALL MRN: [id number] DOB: 01-18-1951  Crystal [name] is a 71 y.o. year old female who sees Crist Infante, MD for primary care. I spoke with  Crystal [name] by phone today  What matters to the patients health and wellness today?  Patient would like to continue to improve her diabetes.     Goals Addressed       Patient Stated     I would like to continue to improve my diabetes (pt-stated)        Care Coordination Interventions:  Assessed patient's understanding of A1c goal: <7% Lab Results  Component Value Date   HGBA1C 8.5 (H) 10/27/2021  Provided education to patient about basic DM disease process Review of patient status, including review of consultants reports, relevant laboratory and other test results, and medications completed Reviewed medications with patient and discussed importance of medication adherence Provided patient with written educational materials related to hypo and hyperglycemia and importance of correct treatment Advised patient, providing education and rationale, to check cbg daily before meals and at bedtime and record, calling PCP for findings outside established parameters Determined patient would like to resume manual blood sugar checks due to having difficulty with Libre sensor not adhering to her skin despite multiple efforts Sent in basket message to PCP provider, Dr. Crist Infante requesting a new glucometer Rx be sent to patient's preferred pharmacy listed in [place] social determinant of health barriers Mailed printed educational materials related to Hypo/Hyperglycemic management; Eating Right with Diabetes and Kidney disease    SDOH assessments and interventions completed:  Yes    Care Coordination Interventions Activated:  Yes  Care Coordination Interventions:  Yes, provided   Follow up plan: Follow up call scheduled for 04/01/22 '@10'$ :30 AM    Encounter Outcome:  Pt. Visit  Completed

## 2022-03-09 NOTE — Progress Notes (Signed)
This encounter was created in error - please disregard.

## 2022-03-09 NOTE — Patient Instructions (Signed)
Visit Information  Thank you for taking time to visit with me today. Please don't hesitate to contact me if I can be of assistance to you.   Following are the goals we discussed today:   Goals Addressed       Patient Stated     I would like to continue to improve my diabetes (pt-stated)        Care Coordination Interventions:  Assessed patient's understanding of A1c goal: <7% Lab Results  Component Value Date   HGBA1C 8.5 (H) 10/27/2021  Provided education to patient about basic DM disease process Review of patient status, including review of consultants reports, relevant laboratory and other test results, and medications completed Reviewed medications with patient and discussed importance of medication adherence Provided patient with written educational materials related to hypo and hyperglycemia and importance of correct treatment Advised patient, providing education and rationale, to check cbg daily before meals and at bedtime and record, calling PCP for findings outside established parameters Determined patient would like to resume manual blood sugar checks due to having difficulty with Libre sensor not adhering to her skin despite multiple efforts Sent in basket message to PCP provider, Dr. Crist Infante requesting a new glucometer Rx be sent to patient's preferred pharmacy listed in [place] determinant of health barriers Mailed printed educational materials related to Hypo/Hyperglycemic management; Eating Right with Diabetes and Kidney disease       Our next appointment is by telephone on 04/01/22 at 10:30 AM  Please call the care guide team at [phone] if you need to cancel or reschedule your appointment.   If you are experiencing a Mental Health or [place] or need someone to talk to, please call 1-800-273-TALK (toll free, 24 hour hotline)  Patient verbalizes understanding of instructions and care plan provided today and agrees to view in  [place]. Active MyChart status and patient understanding of how to access instructions and care plan via MyChart confirmed with patient.     Barb Merino, RN, BSN, CCM Care Management Coordinator [facility]  Direct Phone: [phone]

## 2022-03-11 LAB — TOXASSURE SELECT,+ANTIDEPR,UR

## 2022-03-14 ENCOUNTER — Telehealth: Payer: Self-pay | Admitting: *Deleted

## 2022-03-14 NOTE — Telephone Encounter (Signed)
Urine drug screen for this encounter is consistent for prescribed medication 

## 2022-03-25 ENCOUNTER — Ambulatory Visit (INDEPENDENT_AMBULATORY_CARE_PROVIDER_SITE_OTHER): Payer: Medicare Other

## 2022-03-25 DIAGNOSIS — I428 Other cardiomyopathies: Secondary | ICD-10-CM

## 2022-03-29 LAB — CUP PACEART REMOTE DEVICE CHECK
Battery Remaining Longevity: 66 mo
Battery Remaining Percentage: 75 %
Brady Statistic RA Percent Paced: 0 %
Brady Statistic RV Percent Paced: 100 %
Date Time Interrogation Session: 20230901020100
HighPow Impedance: 70 Ohm
Implantable Lead Implant Date: 20191129
Implantable Lead Implant Date: 20191129
Implantable Lead Implant Date: 20191129
Implantable Lead Location: 753858
Implantable Lead Location: 753859
Implantable Lead Location: 753860
Implantable Lead Model: 293
Implantable Lead Model: 4086
Implantable Lead Serial Number: 226691
Implantable Lead Serial Number: 440868
Implantable Pulse Generator Implant Date: 20191129
Lead Channel Impedance Value: 481 Ohm
Lead Channel Impedance Value: 588 Ohm
Lead Channel Impedance Value: 874 Ohm
Lead Channel Setting Pacing Amplitude: 2 V
Lead Channel Setting Pacing Amplitude: 2.5 V
Lead Channel Setting Pacing Amplitude: 3 V
Lead Channel Setting Pacing Pulse Width: 0.4 ms
Lead Channel Setting Pacing Pulse Width: 1 ms
Lead Channel Setting Sensing Sensitivity: 0.5 mV
Lead Channel Setting Sensing Sensitivity: 1 mV
Pulse Gen Serial Number: 227627

## 2022-04-01 ENCOUNTER — Ambulatory Visit: Payer: Self-pay

## 2022-04-01 NOTE — Patient Instructions (Signed)
Visit Information  Thank you for taking time to visit with me today. Please don't hesitate to contact me if I can be of assistance to you.   Following are the goals we discussed today:   Goals Addressed     Patient Stated     I would like to continue to improve my diabetes (pt-stated)        Care Coordination Interventions:  Assessed patient's understanding of A1c goal: <7% Lab Results  Component Value Date   HGBA1C 8.5 (H) 10/27/2021  Determined patient received and reviewed the educational materials mailed following last RN CC follow up, patient denies having questions at this time  Determined patient received a new glucometer and is checking her blood sugars, reports FBS 119, 176, >200 after meals  Discussed patient has eaten out this week due to doing some traveling and she believes this has elevated her blood sugars Encouraged patient to continue to monitor her sugars and record reporting persistent lows and or highs to her PCP Educated on daily glycemic control FBS 80-130, <180 after meals     Our next appointment is by telephone on 05/27/22 at 09:15 AM  Please call the care guide team at [phone] if you need to cancel or reschedule your appointment.   If you are experiencing a Mental Health or [place] or need someone to talk to, please call 1-800-273-TALK (toll free, 24 hour hotline)  Patient verbalizes understanding of instructions and care plan provided today and agrees to view in [place]. Active MyChart status and patient understanding of how to access instructions and care plan via MyChart confirmed with patient.     Barb Merino, RN, BSN, CCM Care Management Coordinator [facility] Care Management Direct Phone: [phone]

## 2022-04-01 NOTE — Patient Outreach (Signed)
  Care Coordination   Follow Up Visit Note   04/01/2022 Name: Crystal [name] MRN: [id number] DOB: [birth year]  Crystal [name] is a 71 y.o. year old female who sees Crist Infante, MD for primary care. I spoke with  Crystal [name] by phone today.  What matters to the patients health and wellness today?  Patient will continue to self monitor her blood sugars and report concerns to her PCP.     Goals Addressed     Patient Stated     I would like to continue to improve my diabetes (pt-stated)        Care Coordination Interventions:  Assessed patient's understanding of A1c goal: <7% Lab Results  Component Value Date   HGBA1C 8.5 (H) 10/27/2021  Determined patient received and reviewed the educational materials mailed following last RN CC follow up, patient denies having questions at this time  Determined patient received a new glucometer and is checking her blood sugars, reports FBS 119, 176, >200 after meals  Discussed patient has eaten out this week due to doing some traveling and she believes this has elevated her blood sugars Encouraged patient to continue to monitor her sugars and record reporting persistent lows and or highs to her PCP Educated on daily glycemic control FBS 80-130, <180 after meals     SDOH assessments and interventions completed:  No     Care Coordination Interventions Activated:  Yes  Care Coordination Interventions:  Yes, provided   Follow up plan: Follow up call scheduled for 05/27/22 '@09'$ :15 AM    Encounter Outcome:  Pt. Visit Completed

## 2022-04-04 ENCOUNTER — Encounter: Payer: Medicare Other | Attending: Registered Nurse | Admitting: Registered Nurse

## 2022-04-04 ENCOUNTER — Encounter: Payer: Self-pay | Admitting: Registered Nurse

## 2022-04-04 VITALS — BP 105/68 | HR 77 | Ht 64.0 in | Wt 241.0 lb

## 2022-04-04 DIAGNOSIS — M170 Bilateral primary osteoarthritis of knee: Secondary | ICD-10-CM | POA: Diagnosis not present

## 2022-04-04 DIAGNOSIS — M2550 Pain in unspecified joint: Secondary | ICD-10-CM | POA: Diagnosis not present

## 2022-04-04 DIAGNOSIS — M545 Low back pain, unspecified: Secondary | ICD-10-CM | POA: Insufficient documentation

## 2022-04-04 DIAGNOSIS — G8929 Other chronic pain: Secondary | ICD-10-CM | POA: Diagnosis not present

## 2022-04-04 DIAGNOSIS — G894 Chronic pain syndrome: Secondary | ICD-10-CM | POA: Insufficient documentation

## 2022-04-04 DIAGNOSIS — M7918 Myalgia, other site: Secondary | ICD-10-CM | POA: Insufficient documentation

## 2022-04-04 DIAGNOSIS — Z79891 Long term (current) use of opiate analgesic: Secondary | ICD-10-CM | POA: Insufficient documentation

## 2022-04-04 DIAGNOSIS — Z5181 Encounter for therapeutic drug level monitoring: Secondary | ICD-10-CM | POA: Insufficient documentation

## 2022-04-04 MED ORDER — OXYCODONE-ACETAMINOPHEN 5-325 MG PO TABS: 1.0000 | ORAL_TABLET | Freq: Three times a day (TID) | ORAL | 0 refills | Status: DC | PRN

## 2022-04-04 NOTE — Patient Instructions (Signed)
Stop taking the Hydrocodone. Lock the Hydrocodone up We will Trial Oxycodone today  Today : Oxycodone '5mg'$ /325 mg one tablet twice a day as needed for pain.   Call Office in two weeks with update

## 2022-04-04 NOTE — Progress Notes (Signed)
Subjective:    Patient ID: Crystal [name], female    DOB: 22-May-1951, 71 y.o.   MRN: [id number]  HPI: Crystal [name] is a 71 y.o. female who returns for follow up appointment for chronic pain and medication refill. She states her pain is located in her lower back pain, bilateral knee pain and generalized joint pain. She rates her pain 6. She states everytime she takes her hydrocodone she has itching, hydrocodone discontinued and Oxycodone e-scribed today. She was instructed to call office in two weeks with update, she was prescribed Oxycodone in the past and she doesn't recall any reaction. Her current exercise regime is walking short distances with cane  and performing stretching exercises.  [name] Morphine equivalent is 26.67 MME.   Last UDS was Performed on 03/08/2022, it was consistent.     Pain Inventory Average Pain 8 Pain Right Now 6 My pain is sharp, burning, and stabbing  In the last 24 hours, has pain interfered with the following? General activity 9 Relation with others 7 Enjoyment of life 9 What TIME of day is your pain at its worst? morning  and evening Sleep (in general) Good  Pain is worse with: walking, bending, standing, and some activites Pain improves with: rest, medication, and injections Relief from Meds: 8  Family History  Problem Relation Age of Onset   Hypertension Father    Heart disease Father    Cancer Father        prostate   Heart disease Other        (Maternal side) Ischemic heart disease   Diabetes Mellitus II Other    Arrhythmia Mother    Diabetes Mellitus II Mother        Borderline DM   Hypertension Mother    Asthma Mother    Cancer Brother    Dementia Brother    Hypertension Brother    Hypertension Daughter    Hypertension Daughter    Diabetes Daughter    Hypertension Daughter    Atrial fibrillation Daughter    GER disease Daughter    Hypertension Son    Anxiety disorder Son    Hypothyroidism Son    Breast cancer Maternal  Grandmother        in her 58's   Social History   Socioeconomic History   Marital status: Widowed    Spouse name: Not on file   Number of children: Not on file   Years of education: Not on file   Highest education level: Not on file  Occupational History    Comment: retired LPN  Tobacco Use   Smoking status: Never   Smokeless tobacco: Never  Vaping Use   Vaping Use: Never used  Substance and Sexual Activity   Alcohol use: Not Currently   Drug use: Never   Sexual activity: Not Currently  Other Topics Concern   Not on file  Social History Narrative   Pt lives in [place].  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      As of 03/15/17:   Diet: 1800 Calorie      Caffeine: Yes      Married, if yes what year: Widowed, married 1973      Do you live in a house, apartment, assisted living, [place], trailer, ect: House, 1 stories, and 1 person      Pets: No      Current/Past profession: Corporate treasurer, retired  Exercise: Yes, walking          Living Will: Yes   DNR: No   POA/HPOA: Yes      Functional Status:   Do you have difficulty bathing or dressing yourself? No   Do you have difficulty preparing food or eating? No   Do you have difficulty managing your medications? No   Do you have difficulty managing your finances? No   Do you have difficulty affording your medications? Yes   Social Determinants of Health   Financial Resource Strain: Low Risk  (10/11/2017)   Overall Financial Resource Strain (CARDIA)    Difficulty of Paying Living Expenses: Not hard at all  Food Insecurity: No Food Insecurity (01/03/2022)   Hunger Vital Sign    Worried About Running Out of Food in the Last Year: Never true    Ran Out of Food in the Last Year: Never true  Transportation Needs: No Transportation Needs (01/03/2022)   PRAPARE - Hydrologist (Medical): No    Lack of Transportation (Non-Medical): No  Physical  Activity: Inactive (10/11/2017)   Exercise Vital Sign    Days of Exercise per Week: 0 days    Minutes of Exercise per Session: 0 min  Stress: Stress Concern Present (10/11/2017)   [place]    Feeling of Stress : To some extent  Social Connections: Somewhat Isolated (10/11/2017)   Social Connection and Isolation Panel [NHANES]    Frequency of Communication with Friends and Family: More than three times a week    Frequency of Social Gatherings with Friends and Family: More than three times a week    Attends Religious Services: More than 4 times per year    Active Member of [employer] or Organizations: No    Attends Archivist Meetings: Never    Marital Status: Widowed   Past Surgical History:  Procedure Laterality Date   APPENDECTOMY  07/13/2017   BLADDER SUSPENSION  1990   "w/hysterectomy"   BOWEL RESECTION N/A 07/09/2018   Procedure: SMALL BOWEL RESECTION;  Surgeon: Judeth Horn, MD;  Location: [facility];  Service: General;  Laterality: N/A;   CARDIAC CATHETERIZATION  2005   no obstructive CAD per patient   CARDIAC CATHETERIZATION  2018   CARDIAC DEFIBRILLATOR PLACEMENT  2005   BiV ICD implanted,  LV lead is an epicardial lead   CARPAL TUNNEL RELEASE Left 07/25/2014   Dr.Williamson    CARPAL TUNNEL RELEASE Right 04/08/2019   Procedure: RIGHT CARPAL TUNNEL RELEASE ENDOSCOPIC;  Surgeon: Milly Jakob, MD;  Location: [facility];  Service: Orthopedics;  Laterality: Right;   CATARACT EXTRACTION W/ INTRAOCULAR LENS  IMPLANT, BILATERAL Bilateral    COLONIC STENT PLACEMENT N/A 08/31/2017   Procedure: COLONIC STENT PLACEMENT;  Surgeon: Carol Ada, MD;  Location: [facility];  Service: Endoscopy;  Laterality: N/A;   COLONOSCOPY     2010-2011 Dr.Kipreos    COLOSTOMY  12/2016   Archie Endo 01/26/2017   COLOSTOMY REVERSAL N/A 07/13/2017   Procedure: COLOSTOMY REVERSAL;  Surgeon: Judeth Horn, MD;  Location: [facility];  Service: General;   Laterality: N/A;   CYST REMOVAL HAND Right 05/2013   thumb   DILATION AND CURETTAGE OF UTERUS  X 5-6   ESOPHAGOGASTRODUODENOSCOPY (EGD) WITH PROPOFOL N/A 03/13/2020   Procedure: ESOPHAGOGASTRODUODENOSCOPY (EGD) WITH PROPOFOL;  Surgeon: Carol Ada, MD;  [facility];  Service: Endoscopy;  Laterality: N/A;   EXCISION MASS ABDOMINAL N/A 07/09/2018  Procedure: EXPLORATION OF  ABDOMINAL WOUND ERAS PATHWAY;  Surgeon: Judeth Horn, MD;  Location: [facility];  Service: General;  Laterality: N/A;   FLEXIBLE SIGMOIDOSCOPY N/A 08/24/2017   Procedure: Beryle Quant;  Surgeon: Carol Ada, MD;  Location: [facility];  Service: Endoscopy;  Laterality: N/A;   FLEXIBLE SIGMOIDOSCOPY N/A 08/31/2017   Procedure: FLEXIBLE SIGMOIDOSCOPY;  Surgeon: Carol Ada, MD;  Location: [facility];  Service: Endoscopy;  Laterality: N/A;  stent placement   FLEXIBLE SIGMOIDOSCOPY N/A 03/13/2020   Procedure: FLEXIBLE SIGMOIDOSCOPY;  Surgeon: Carol Ada, MD;  [facility];  Service: Endoscopy;  Laterality: N/A;   FLEXIBLE SIGMOIDOSCOPY N/A 12/18/2020   Procedure: FLEXIBLE SIGMOIDOSCOPY;  Surgeon: Carol Ada, MD;  [facility];  Service: Endoscopy;  Laterality: N/A;   HEMOSTASIS CLIP PLACEMENT  03/13/2020   Procedure: HEMOSTASIS CLIP PLACEMENT;  Surgeon: Carol Ada, MD;  [facility];  Service: Endoscopy;;   HEMOSTASIS CLIP PLACEMENT  12/18/2020   Procedure: HEMOSTASIS CLIP PLACEMENT;  Surgeon: Carol Ada, MD;  [facility];  Service: Endoscopy;;   HOT HEMOSTASIS N/A 03/13/2020   Procedure: HOT HEMOSTASIS (ARGON PLASMA COAGULATION/BICAP);  Surgeon: Carol Ada, MD;  Location[facility];  Service: Endoscopy;  Laterality: N/A;   HOT HEMOSTASIS N/A 12/18/2020   Procedure: HOT HEMOSTASIS (ARGON PLASMA COAGULATION/BICAP);  Surgeon: Carol Ada, MD;  Location[facility];  Service: Endoscopy;  Laterality: N/A;   IMPLANTABLE CARDIOVERTER DEFIBRILLATOR GENERATOR  CHANGE  2008   INSERTION OF MESH N/A 07/09/2018   Procedure: INSERTION OF VICRYL MESH;  Surgeon: Judeth Horn, MD;  Location: [facility];  Service: General;  Laterality: N/A;   LAPAROTOMY N/A 09/18/2017   Procedure: EXPLORATORY LAPAROTOMY;  Surgeon: Judeth Horn, MD;  Location: [facility];  Service: General;  Laterality: N/A;   LEAD REVISION  06/22/2018   LEAD REVISION/REPAIR N/A 06/22/2018   Procedure: LEAD REVISION/REPAIR;  Surgeon: Deboraha Sprang, MD;  Location: [facility];  Service: Cardiovascular;  Laterality: N/A;   LYSIS OF ADHESION N/A 09/18/2017   Procedure: LYSIS OF ADHESION;  Surgeon: Judeth Horn, MD;  Location: [facility];  Service: General;  Laterality: N/A;   LYSIS OF ADHESION N/A 07/09/2018   Procedure: LYSIS OF ADHESION;  Surgeon: Judeth Horn, MD;  Location: [facility];  Service: General;  Laterality: N/A;   PARTIAL COLECTOMY N/A 09/18/2017   Procedure: ILEOCOLECTOMY;  Surgeon: Judeth Horn, MD;  Location: [facility];  Service: General;  Laterality: N/A;   PARTIAL COLECTOMY  09/25/2017   POLYPECTOMY  03/13/2020   Procedure: POLYPECTOMY;  Surgeon: Carol Ada, MD;  [facility];  Service: Endoscopy;;   RIGHT/LEFT HEART CATH AND CORONARY ANGIOGRAPHY N/A 06/01/2018   Procedure: RIGHT/LEFT HEART CATH AND CORONARY ANGIOGRAPHY;  Surgeon: Jettie Booze, MD;  Location: [facility];  Service: Cardiovascular;  Laterality: N/A;   SIGMOIDOSCOPY N/A 09/18/2017   Procedure: SIGMOIDOSCOPY;  Surgeon: Judeth Horn, MD;  Location: [facility];  Service: General;  Laterality: N/A;   SMALL INTESTINE SURGERY  07/09/2018   EXPLORATION OF  ABDOMINAL WOUND ERAS PATHWAYN/; MESH; LYSIS OF ADHESIONS   TOTAL ABDOMINAL HYSTERECTOMY  1990   TRANSFORAMINAL LUMBAR INTERBODY FUSION (TLIF) WITH PEDICLE SCREW FIXATION 2 LEVEL N/A 01/07/2020   Procedure: Lumbar Four-Five and Lumbar Five-Sacral One open lumbar decompression and transforaminal lumbar interbody fusion;  Surgeon: Judith Part, MD;  Location:  [facility];  Service: Neurosurgery;  Laterality: N/A;  Lumbar Four-Five and Lumbar Five-Sacral One open lumbar decompression and transforaminal lumbar interbody fusion   TRIGGER  FINGER RELEASE Right 05/213   TRIGGER FINGER RELEASE Right 04/08/2019   Procedure: RIGHT INDEX RELEASE TRIGGER FINGER/A-1 PULLEY;  Surgeon: Milly Jakob, MD;  Location: [facility];  Service: Orthopedics;  Laterality: Right;   TUBAL LIGATION  1980s   VESICOVAGINAL FISTULA CLOSURE W/ TAH     Past Surgical History:  Procedure Laterality Date   APPENDECTOMY  07/13/2017   BLADDER SUSPENSION  1990   "w/hysterectomy"   BOWEL RESECTION N/A 07/09/2018   Procedure: SMALL BOWEL RESECTION;  Surgeon: Judeth Horn, MD;  Location: [facility];  Service: General;  Laterality: N/A;   CARDIAC CATHETERIZATION  2005   no obstructive CAD per patient   CARDIAC CATHETERIZATION  2018   CARDIAC DEFIBRILLATOR PLACEMENT  2005   BiV ICD implanted,  LV lead is an epicardial lead   CARPAL TUNNEL RELEASE Left 07/25/2014   Dr.Williamson    CARPAL TUNNEL RELEASE Right 04/08/2019   Procedure: RIGHT CARPAL TUNNEL RELEASE ENDOSCOPIC;  Surgeon: Milly Jakob, MD;  Location: [facility];  Service: Orthopedics;  Laterality: Right;   CATARACT EXTRACTION W/ INTRAOCULAR LENS  IMPLANT, BILATERAL Bilateral    COLONIC STENT PLACEMENT N/A 08/31/2017   Procedure: COLONIC STENT PLACEMENT;  Surgeon: Carol Ada, MD;  Location: [facility];  Service: Endoscopy;  Laterality: N/A;   COLONOSCOPY     2010-2011 Dr.Kipreos    COLOSTOMY  12/2016   Archie Endo 01/26/2017   COLOSTOMY REVERSAL N/A 07/13/2017   Procedure: COLOSTOMY REVERSAL;  Surgeon: Judeth Horn, MD;  Location: [facility];  Service: General;  Laterality: N/A;   CYST REMOVAL HAND Right 05/2013   thumb   DILATION AND CURETTAGE OF UTERUS  X 5-6   ESOPHAGOGASTRODUODENOSCOPY (EGD) WITH PROPOFOL N/A 03/13/2020   Procedure: ESOPHAGOGASTRODUODENOSCOPY (EGD) WITH PROPOFOL;  Surgeon: Carol Ada, MD;  [facility];  Service:  Endoscopy;  Laterality: N/A;   EXCISION MASS ABDOMINAL N/A 07/09/2018   Procedure: EXPLORATION OF  ABDOMINAL WOUND ERAS PATHWAY;  Surgeon: Judeth Horn, MD;  Location: [facility];  Service: General;  Laterality: N/A;   FLEXIBLE SIGMOIDOSCOPY N/A 08/24/2017   Procedure: Beryle Quant;  Surgeon: Carol Ada, MD;  Location: [facility];  Service: Endoscopy;  Laterality: N/A;   FLEXIBLE SIGMOIDOSCOPY N/A 08/31/2017   Procedure: FLEXIBLE SIGMOIDOSCOPY;  Surgeon: Carol Ada, MD;  Location: [facility];  Service: Endoscopy;  Laterality: N/A;  stent placement   FLEXIBLE SIGMOIDOSCOPY N/A 03/13/2020   Procedure: FLEXIBLE SIGMOIDOSCOPY;  Surgeon: Carol Ada, MD;  [facility];  Service: Endoscopy;  Laterality: N/A;   FLEXIBLE SIGMOIDOSCOPY N/A 12/18/2020   Procedure: FLEXIBLE SIGMOIDOSCOPY;  Surgeon: Carol Ada, MD;  [facility];  Service: Endoscopy;  Laterality: N/A;   HEMOSTASIS CLIP PLACEMENT  03/13/2020   Procedure: HEMOSTASIS CLIP PLACEMENT;  Surgeon: Carol Ada, MD;  [facility];  Service: Endoscopy;;   HEMOSTASIS CLIP PLACEMENT  12/18/2020   Procedure: HEMOSTASIS CLIP PLACEMENT;  Surgeon: Carol Ada, MD;  [facility];  Service: Endoscopy;;   HOT HEMOSTASIS N/A 03/13/2020   Procedure: HOT HEMOSTASIS (ARGON PLASMA COAGULATION/BICAP);  Surgeon: Carol Ada, MD;  [facility];  Service: Endoscopy;  Laterality: N/A;   HOT HEMOSTASIS N/A 12/18/2020   Procedure: HOT HEMOSTASIS (ARGON PLASMA COAGULATION/BICAP);  Surgeon: Carol Ada, MD;  Location[facility];  Service: Endoscopy;  Laterality: N/A;   IMPLANTABLE CARDIOVERTER DEFIBRILLATOR GENERATOR CHANGE  2008   INSERTION OF MESH N/A 07/09/2018   Procedure: INSERTION OF VICRYL MESH;  Surgeon: Judeth Horn, MD;  Location: [facility];  Service: General;  Laterality: N/A;  LAPAROTOMY N/A 09/18/2017   Procedure: EXPLORATORY LAPAROTOMY;  Surgeon: Judeth Horn, MD;  Location: [facility];  Service:  General;  Laterality: N/A;   LEAD REVISION  06/22/2018   LEAD REVISION/REPAIR N/A 06/22/2018   Procedure: LEAD REVISION/REPAIR;  Surgeon: Deboraha Sprang, MD;  Location: [facility];  Service: Cardiovascular;  Laterality: N/A;   LYSIS OF ADHESION N/A 09/18/2017   Procedure: LYSIS OF ADHESION;  Surgeon: Judeth Horn, MD;  Location: [facility];  Service: General;  Laterality: N/A;   LYSIS OF ADHESION N/A 07/09/2018   Procedure: LYSIS OF ADHESION;  Surgeon: Judeth Horn, MD;  Location: [facility];  Service: General;  Laterality: N/A;   PARTIAL COLECTOMY N/A 09/18/2017   Procedure: ILEOCOLECTOMY;  Surgeon: Judeth Horn, MD;  Location: [facility];  Service: General;  Laterality: N/A;   PARTIAL COLECTOMY  09/25/2017   POLYPECTOMY  03/13/2020   Procedure: POLYPECTOMY;  Surgeon: Carol Ada, MD;  [facility];  Service: Endoscopy;;   RIGHT/LEFT HEART CATH AND CORONARY ANGIOGRAPHY N/A 06/01/2018   Procedure: RIGHT/LEFT HEART CATH AND CORONARY ANGIOGRAPHY;  Surgeon: Jettie Booze, MD;  Location: [facility];  Service: Cardiovascular;  Laterality: N/A;   SIGMOIDOSCOPY N/A 09/18/2017   Procedure: SIGMOIDOSCOPY;  Surgeon: Judeth Horn, MD;  Location: [facility];  Service: General;  Laterality: N/A;   SMALL INTESTINE SURGERY  07/09/2018   EXPLORATION OF  ABDOMINAL WOUND ERAS PATHWAYN/; MESH; LYSIS OF ADHESIONS   TOTAL ABDOMINAL HYSTERECTOMY  1990   TRANSFORAMINAL LUMBAR INTERBODY FUSION (TLIF) WITH PEDICLE SCREW FIXATION 2 LEVEL N/A 01/07/2020   Procedure: Lumbar Four-Five and Lumbar Five-Sacral One open lumbar decompression and transforaminal lumbar interbody fusion;  Surgeon: Judith Part, MD;  Location: [facility];  Service: Neurosurgery;  Laterality: N/A;  Lumbar Four-Five and Lumbar Five-Sacral One open lumbar decompression and transforaminal lumbar interbody fusion   TRIGGER FINGER RELEASE Right 05/213   TRIGGER FINGER RELEASE Right 04/08/2019   Procedure: RIGHT INDEX RELEASE TRIGGER  FINGER/A-1 PULLEY;  Surgeon: Milly Jakob, MD;  Location: [facility];  Service: Orthopedics;  Laterality: Right;   TUBAL LIGATION  1980s   VESICOVAGINAL FISTULA CLOSURE W/ TAH     Past Medical History:  Diagnosis Date   AICD (automatic cardioverter/defibrillator) present    high RV threshold chronically, device was turned off in 2014; Device battery has been dead x 7 years; "turned it back on 06/22/2018"   Anemia, iron deficiency    Angioedema    felt to likely be due to ace inhibitors but says she has had this even off of medicines, appears to be tolerating ARBs chronically   Anxiety    Arthritis    "hands, legs, arms; bad in my back" (06/22/2018)   Asthmatic bronchitis with status asthmaticus    Bradycardia    CHF (congestive heart failure) (HCC)    Chronic lower back pain    Chronic pain    CKD (chronic kidney disease), stage III (HCC)    Coronary artery disease    Mild nonobstructive (30% LAD, 30% RCA) by 09/01/16 cath [facility]   Degenerative joint disease    Depression    Diabetes mellitus, type 2 ([place])    Diabetic peripheral neuropathy (HCC)    Dizziness    Dyspnea on exertion    Family history of adverse reaction to anesthesia    Mother has nausea   GERD (gastroesophageal reflux disease)    Gout    Heart valve disorder    History of blood  transfusion 1981; ~ 2005; 12/2016   "childbirth; defibrillator OR; colostomy OR"   History of mononucleosis 03/2014   Hyperlipidemia    Hypertension    Hypothyroid    Insomnia    Intervertebral disc degeneration    Ischemic cardiomyopathy    LBBB (left bundle branch block)    Myocardial infarction ([place]) dx'd ~ 2005   Nonischemic cardiomyopathy ([place])    s/p ICD in 2005. EF has since recovered   Obesity    On home oxygen therapy    "2L at night" (06/22/2018)   OSA on CPAP    Pneumonia    "couple times; last time was 12/2016" (06/22/2018)   PONV (postoperative nausea and vomiting)    Presence of permanent cardiac  pacemaker    [employer]   Swelling    Syncope    Systemic hypertension    Vitamin D deficiency    BP 105/68 (BP Location: Left Arm, Patient Position: Sitting, Cuff Size: Normal)   Pulse 77   Ht '5\' 4"'$  (1.626 m)   Wt 241 lb (109.3 kg)   SpO2 92%   BMI 41.37 kg/m   Opioid Risk Score:   Fall Risk Score:  `1  Depression screen [facility] 2/9     03/08/2022   10:19 AM 01/26/2022    9:54 AM 01/03/2022   11:24 AM 12/29/2021   10:23 AM 11/24/2021   11:24 AM 10/26/2021   10:22 AM 09/28/2021   10:20 AM  Depression screen PHQ 2/9  Decreased Interest 0 0 0 0 0 0 0  Down, Depressed, Hopeless 0 0 0 0 0 0 0  PHQ - 2 Score 0 0 0 0 0 0 0     Review of Systems  Musculoskeletal:  Positive for back pain.       Bilateral wrist pain Bilateral knee pain  All other systems reviewed and are negative.     Objective:   Physical Exam Vitals and nursing note reviewed.  Constitutional:      Appearance: Normal appearance.  Cardiovascular:     Rate and Rhythm: Normal rate and regular rhythm.     Pulses: Normal pulses.     Heart sounds: Normal heart sounds.  Pulmonary:     Effort: Pulmonary effort is normal.     Breath sounds: Normal breath sounds.  Musculoskeletal:     Cervical back: Normal range of motion and neck supple.     Comments: Normal Muscle Bulk and Muscle Testing Reveals:  Upper Extremities: Full ROM and Muscle Strength 5/5 Thoracic Paraspinal Tenderness: T-7-T-9 Lumbar Paraspinal Tenderness: L-4-L-5 Lower Extremities: Full ROM and Muscle Strength 5/5 Right Lower Extremity Flexion Produces Pain into her Right Patella Arises from Table slowly using cane for support Antalgic Gait    Skin:    General: Skin is warm and dry.  Neurological:     Mental Status: She is alert and oriented to person, place, and time.  Psychiatric:        Mood and Affect: Mood normal.        Behavior: Behavior normal.         Assessment & Plan:  Chronic Low Back Pain: Encouraged to increase HEP as  Tolerated. Continue to Monitor.  04/04/2022 Bilateral Primary Osteoarthritis:  S/P Right Knee Genicular nerve radiofrequency neurotomy, with Dr Letta Pate. On 09/28/2021 and S/P Left Knee Genicular Nerve Radiofrequency on 10/05/2021. With good relief noted.   Indication Chronic post operative pain in the Knee, pain postop total knee replacement which has not responded  to conservative management such as physical therapy and medication management; Continue monitor. 04/04/2022 3. Myofascial Pain: S/P Trigger Point Injection on 06/29/2021 with Good Relief Noted. 04/04/2022 4. Chronic Pain Syndrome: RX: Oxycodone '5mg'$ /325 mg one tablet three times a day as needed for pain #60, Discontinue Hydrocodone due to itching, Ms. Stites verbalizes understanding.  We will continue the opioid monitoring program, this consists of regular clinic visits, examinations, urine drug screen, pill counts as well as use of [place] Controlled Substance Reporting system. A 12 month History has been reviewed on the [place] Controlled Substance Reporting System on 04/04/2022.   F/U in 1 month

## 2022-04-13 NOTE — Progress Notes (Signed)
Remote ICD transmission.   

## 2022-04-20 ENCOUNTER — Telehealth: Payer: Self-pay | Admitting: *Deleted

## 2022-04-20 DIAGNOSIS — Z23 Encounter for immunization: Secondary | ICD-10-CM | POA: Diagnosis not present

## 2022-04-20 NOTE — Telephone Encounter (Signed)
Mrs Bodner called to let Zella Ball know that new medication is working much better. No need to call back.

## 2022-04-26 ENCOUNTER — Telehealth: Payer: Self-pay | Admitting: Registered Nurse

## 2022-04-26 MED ORDER — OXYCODONE-ACETAMINOPHEN 5-325 MG PO TABS: 1.0000 | ORAL_TABLET | Freq: Three times a day (TID) | ORAL | 0 refills | Status: DC | PRN

## 2022-04-26 NOTE — Telephone Encounter (Signed)
Return Ms, [name] call,  she has the flu.  Her appointment was rescheduled. PMP was Reviewed.  Oxycodone e-scribed today.  Crystal [name] understanding.

## 2022-04-26 NOTE — Telephone Encounter (Signed)
Patient canceled appt 10/4. She has the FLU. She has meds for 10 days. When would you like her to come in?

## 2022-04-27 ENCOUNTER — Encounter: Payer: Medicare Other | Admitting: Registered Nurse

## 2022-05-09 DIAGNOSIS — I509 Heart failure, unspecified: Secondary | ICD-10-CM | POA: Diagnosis not present

## 2022-05-09 DIAGNOSIS — E039 Hypothyroidism, unspecified: Secondary | ICD-10-CM | POA: Diagnosis not present

## 2022-05-09 DIAGNOSIS — E1129 Type 2 diabetes mellitus with other diabetic kidney complication: Secondary | ICD-10-CM | POA: Diagnosis not present

## 2022-05-09 DIAGNOSIS — J4540 Moderate persistent asthma, uncomplicated: Secondary | ICD-10-CM | POA: Diagnosis not present

## 2022-05-09 DIAGNOSIS — N184 Chronic kidney disease, stage 4 (severe): Secondary | ICD-10-CM | POA: Diagnosis not present

## 2022-05-09 DIAGNOSIS — I130 Hypertensive heart and chronic kidney disease with heart failure and stage 1 through stage 4 chronic kidney disease, or unspecified chronic kidney disease: Secondary | ICD-10-CM | POA: Diagnosis not present

## 2022-05-09 DIAGNOSIS — R0901 Asphyxia: Secondary | ICD-10-CM | POA: Diagnosis not present

## 2022-05-09 DIAGNOSIS — Z1152 Encounter for screening for COVID-19: Secondary | ICD-10-CM | POA: Diagnosis not present

## 2022-05-09 DIAGNOSIS — Z95810 Presence of automatic (implantable) cardiac defibrillator: Secondary | ICD-10-CM | POA: Diagnosis not present

## 2022-05-09 DIAGNOSIS — Z794 Long term (current) use of insulin: Secondary | ICD-10-CM | POA: Diagnosis not present

## 2022-05-16 DIAGNOSIS — N184 Chronic kidney disease, stage 4 (severe): Secondary | ICD-10-CM | POA: Diagnosis not present

## 2022-05-24 ENCOUNTER — Encounter: Payer: Self-pay | Admitting: Registered Nurse

## 2022-05-24 ENCOUNTER — Encounter: Payer: Medicare Other | Attending: Registered Nurse | Admitting: Registered Nurse

## 2022-05-24 VITALS — BP 128/72 | HR 78 | Ht 64.0 in | Wt 251.6 lb

## 2022-05-24 DIAGNOSIS — Z79891 Long term (current) use of opiate analgesic: Secondary | ICD-10-CM | POA: Insufficient documentation

## 2022-05-24 DIAGNOSIS — M2550 Pain in unspecified joint: Secondary | ICD-10-CM | POA: Insufficient documentation

## 2022-05-24 DIAGNOSIS — M7918 Myalgia, other site: Secondary | ICD-10-CM | POA: Insufficient documentation

## 2022-05-24 DIAGNOSIS — Z5181 Encounter for therapeutic drug level monitoring: Secondary | ICD-10-CM | POA: Insufficient documentation

## 2022-05-24 DIAGNOSIS — G8929 Other chronic pain: Secondary | ICD-10-CM | POA: Insufficient documentation

## 2022-05-24 DIAGNOSIS — G894 Chronic pain syndrome: Secondary | ICD-10-CM | POA: Insufficient documentation

## 2022-05-24 DIAGNOSIS — M545 Low back pain, unspecified: Secondary | ICD-10-CM | POA: Insufficient documentation

## 2022-05-24 DIAGNOSIS — M170 Bilateral primary osteoarthritis of knee: Secondary | ICD-10-CM | POA: Insufficient documentation

## 2022-05-24 MED ORDER — OXYCODONE-ACETAMINOPHEN 5-325 MG PO TABS: 1.0000 | ORAL_TABLET | Freq: Three times a day (TID) | ORAL | 0 refills | Status: DC | PRN

## 2022-05-24 NOTE — Progress Notes (Signed)
Subjective:    Patient ID: Crystal [name], female    DOB: November 21, 1950, 71 y.o.   MRN: [id number]  HPI: Crystal [name] is a 71 y.o. female who returns for follow up appointment for chronic pain and medication refill. She states her pain is located in her lower back, bilateral knees and generalized joint pain. She rates her pain 5. Her current exercise regime is walking short distances and performing stretching exercises.  Ms. Renaud Morphine equivalent is 15.00 MME.   Last UDS was performed on 03/08/2022, it was consistent.      Pain Inventory Average Pain 9 Pain Right Now 5 My pain is sharp, burning, stabbing, and aching  In the last 24 hours, has pain interfered with the following? General activity 7 Relation with others 7 Enjoyment of life 8 What TIME of day is your pain at its worst? morning  and evening Sleep (in general) Fair  Pain is worse with: walking, bending, standing, and some activites Pain improves with: rest, medication, and injections Relief from Meds: 8  Family History  Problem Relation Age of Onset   Hypertension Father    Heart disease Father    Cancer Father        prostate   Heart disease Other        (Maternal side) Ischemic heart disease   Diabetes Mellitus II Other    Arrhythmia Mother    Diabetes Mellitus II Mother        Borderline DM   Hypertension Mother    Asthma Mother    Cancer Brother    Dementia Brother    Hypertension Brother    Hypertension Daughter    Hypertension Daughter    Diabetes Daughter    Hypertension Daughter    Atrial fibrillation Daughter    GER disease Daughter    Hypertension Son    Anxiety disorder Son    Hypothyroidism Son    Breast cancer Maternal Grandmother        in her 48's   Social History   Socioeconomic History   Marital status: Widowed    Spouse name: Not on file   Number of children: Not on file   Years of education: Not on file   Highest education level: Not on file  Occupational History     Comment: retired LPN  Tobacco Use   Smoking status: Never   Smokeless tobacco: Never  Vaping Use   Vaping Use: Never used  Substance and Sexual Activity   Alcohol use: Not Currently   Drug use: Never   Sexual activity: Not Currently  Other Topics Concern   Not on file  Social History Narrative   Pt lives in [place].  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      As of 03/15/17:   Diet: 1800 Calorie      Caffeine: Yes      Married, if yes what year: Widowed, married 1973      Do you live in a house, apartment, assisted living, condo, trailer, ect: House, 1 stories, and 1 person      Pets: No      Current/Past profession: LPN, retired       Exercise: Yes, walking          Living Will: Yes   DNR: No   POA/HPOA: Yes      Functional Status:   Do you have difficulty bathing or dressing yourself?  No   Do you have difficulty preparing food or eating? No   Do you have difficulty managing your medications? No   Do you have difficulty managing your finances? No   Do you have difficulty affording your medications? Yes   Social Determinants of Health   Financial Resource Strain: Low Risk  (10/11/2017)   Overall Financial Resource Strain (CARDIA)    Difficulty of Paying Living Expenses: Not hard at all  Food Insecurity: No Food Insecurity (01/03/2022)   Hunger Vital Sign    Worried About Running Out of Food in the Last Year: Never true    Ran Out of Food in the Last Year: Never true  Transportation Needs: No Transportation Needs (01/03/2022)   PRAPARE - Hydrologist (Medical): No    Lack of Transportation (Non-Medical): No  Physical Activity: Inactive (10/11/2017)   Exercise Vital Sign    Days of Exercise per Week: 0 days    Minutes of Exercise per Session: 0 min  Stress: Stress Concern Present (10/11/2017)   [place]     Feeling of Stress : To some extent  Social Connections: Somewhat Isolated (10/11/2017)   Social Connection and Isolation Panel [NHANES]    Frequency of Communication with Friends and Family: More than three times a week    Frequency of Social Gatherings with Friends and Family: More than three times a week    Attends Religious Services: More than 4 times per year    Active Member of [employer] or Organizations: No    Attends Archivist Meetings: Never    Marital Status: Widowed   Past Surgical History:  Procedure Laterality Date   APPENDECTOMY  07/13/2017   BLADDER SUSPENSION  1990   "w/hysterectomy"   BOWEL RESECTION N/A 07/09/2018   Procedure: SMALL BOWEL RESECTION;  Surgeon: Judeth Horn, MD;  Location: [facility];  Service: General;  Laterality: N/A;   CARDIAC CATHETERIZATION  2005   no obstructive CAD per patient   CARDIAC CATHETERIZATION  2018   CARDIAC DEFIBRILLATOR PLACEMENT  2005   BiV ICD implanted,  LV lead is an epicardial lead   CARPAL TUNNEL RELEASE Left 07/25/2014   Dr.Williamson    CARPAL TUNNEL RELEASE Right 04/08/2019   Procedure: RIGHT CARPAL TUNNEL RELEASE ENDOSCOPIC;  Surgeon: Milly Jakob, MD;  Location: [facility];  Service: Orthopedics;  Laterality: Right;   CATARACT EXTRACTION W/ INTRAOCULAR LENS  IMPLANT, BILATERAL Bilateral    COLONIC STENT PLACEMENT N/A 08/31/2017   Procedure: COLONIC STENT PLACEMENT;  Surgeon: Carol Ada, MD;  Location: [facility];  Service: Endoscopy;  Laterality: N/A;   COLONOSCOPY     2010-2011 Dr.Kipreos    COLOSTOMY  12/2016   Archie Endo 01/26/2017   COLOSTOMY REVERSAL N/A 07/13/2017   Procedure: COLOSTOMY REVERSAL;  Surgeon: Judeth Horn, MD;  Location: [facility];  Service: General;  Laterality: N/A;   CYST REMOVAL HAND Right 05/2013   thumb   DILATION AND CURETTAGE OF UTERUS  X 5-6   ESOPHAGOGASTRODUODENOSCOPY (EGD) WITH PROPOFOL N/A 03/13/2020   Procedure: ESOPHAGOGASTRODUODENOSCOPY (EGD) WITH PROPOFOL;  Surgeon: Carol Ada, MD;   [facility];  Service: Endoscopy;  Laterality: N/A;   EXCISION MASS ABDOMINAL N/A 07/09/2018   Procedure: EXPLORATION OF  ABDOMINAL WOUND ERAS PATHWAY;  Surgeon: Judeth Horn, MD;  Location: [facility];  Service: General;  Laterality: N/A;   FLEXIBLE SIGMOIDOSCOPY N/A 08/24/2017   Procedure: Beryle Quant;  Surgeon: Carol Ada, MD;  [facility];  Service: Endoscopy;  Laterality: N/A;   FLEXIBLE SIGMOIDOSCOPY N/A 08/31/2017   Procedure: FLEXIBLE SIGMOIDOSCOPY;  Surgeon: Carol Ada, MD;  Location: [facility];  Service: Endoscopy;  Laterality: N/A;  stent placement   FLEXIBLE SIGMOIDOSCOPY N/A 03/13/2020   Procedure: FLEXIBLE SIGMOIDOSCOPY;  Surgeon: Carol Ada, MD;  [facility];  Service: Endoscopy;  Laterality: N/A;   FLEXIBLE SIGMOIDOSCOPY N/A 12/18/2020   Procedure: FLEXIBLE SIGMOIDOSCOPY;  Surgeon: Carol Ada, MD;  [facility];  Service: Endoscopy;  Laterality: N/A;   HEMOSTASIS CLIP PLACEMENT  03/13/2020   Procedure: HEMOSTASIS CLIP PLACEMENT;  Surgeon: Carol Ada, MD;  [facility];  Service: Endoscopy;;   HEMOSTASIS CLIP PLACEMENT  12/18/2020   Procedure: HEMOSTASIS CLIP PLACEMENT;  Surgeon: Carol Ada, MD;  [facility];  Service: Endoscopy;;   HOT HEMOSTASIS N/A 03/13/2020   Procedure: HOT HEMOSTASIS (ARGON PLASMA COAGULATION/BICAP);  Surgeon: Carol Ada, MD;  [facility];  Service: Endoscopy;  Laterality: N/A;   HOT HEMOSTASIS N/A 12/18/2020   Procedure: HOT HEMOSTASIS (ARGON PLASMA COAGULATION/BICAP);  Surgeon: Carol Ada, MD;  Location[facility];  Service: Endoscopy;  Laterality: N/A;   IMPLANTABLE CARDIOVERTER DEFIBRILLATOR GENERATOR CHANGE  2008   INSERTION OF MESH N/A 07/09/2018   Procedure: INSERTION OF VICRYL MESH;  Surgeon: Judeth Horn, MD;  Location: [facility];  Service: General;  Laterality: N/A;   LAPAROTOMY N/A 09/18/2017   Procedure: EXPLORATORY LAPAROTOMY;  Surgeon: Judeth Horn, MD;  Location: [facility];  Service: General;  Laterality: N/A;   LEAD REVISION  06/22/2018   LEAD REVISION/REPAIR N/A 06/22/2018   Procedure: LEAD REVISION/REPAIR;  Surgeon: Deboraha Sprang, MD;  Location: [facility];  Service: Cardiovascular;  Laterality: N/A;   LYSIS OF ADHESION N/A 09/18/2017   Procedure: LYSIS OF ADHESION;  Surgeon: Judeth Horn, MD;  Location: [facility];  Service: General;  Laterality: N/A;   LYSIS OF ADHESION N/A 07/09/2018   Procedure: LYSIS OF ADHESION;  Surgeon: Judeth Horn, MD;  Location: [facility];  Service: General;  Laterality: N/A;   PARTIAL COLECTOMY N/A 09/18/2017   Procedure: ILEOCOLECTOMY;  Surgeon: Judeth Horn, MD;  Location: [facility];  Service: General;  Laterality: N/A;   PARTIAL COLECTOMY  09/25/2017   POLYPECTOMY  03/13/2020   Procedure: POLYPECTOMY;  Surgeon: Carol Ada, MD;  [facility];  Service: Endoscopy;;   RIGHT/LEFT HEART CATH AND CORONARY ANGIOGRAPHY N/A 06/01/2018   Procedure: RIGHT/LEFT HEART CATH AND CORONARY ANGIOGRAPHY;  Surgeon: Jettie Booze, MD;  Location: [facility];  Service: Cardiovascular;  Laterality: N/A;   SIGMOIDOSCOPY N/A 09/18/2017   Procedure: SIGMOIDOSCOPY;  Surgeon: Judeth Horn, MD;  Location: [facility];  Service: General;  Laterality: N/A;   SMALL INTESTINE SURGERY  07/09/2018   EXPLORATION OF  ABDOMINAL WOUND ERAS PATHWAYN/; MESH; LYSIS OF ADHESIONS   TOTAL ABDOMINAL HYSTERECTOMY  1990   TRANSFORAMINAL LUMBAR INTERBODY FUSION (TLIF) WITH PEDICLE SCREW FIXATION 2 LEVEL N/A 01/07/2020   Procedure: Lumbar Four-Five and Lumbar Five-Sacral One open lumbar decompression and transforaminal lumbar interbody fusion;  Surgeon: Judith Part, MD;  Location: [facility];  Service: Neurosurgery;  Laterality: N/A;  Lumbar Four-Five and Lumbar Five-Sacral One open lumbar decompression and transforaminal lumbar interbody fusion   TRIGGER FINGER RELEASE Right 05/213   TRIGGER FINGER RELEASE Right 04/08/2019    Procedure: RIGHT INDEX RELEASE TRIGGER FINGER/A-1 PULLEY;  Surgeon: Milly Jakob, MD;  Location: [facility];  Service: Orthopedics;  Laterality: Right;   TUBAL LIGATION  1980s   VESICOVAGINAL FISTULA CLOSURE W/ TAH     Past Surgical History:  Procedure Laterality Date   APPENDECTOMY  07/13/2017   BLADDER SUSPENSION  1990   "w/hysterectomy"   BOWEL RESECTION N/A 07/09/2018   Procedure: SMALL BOWEL RESECTION;  Surgeon: Judeth Horn, MD;  Location: [facility];  Service: General;  Laterality: N/A;   CARDIAC CATHETERIZATION  2005   no obstructive CAD per patient   CARDIAC CATHETERIZATION  2018   CARDIAC DEFIBRILLATOR PLACEMENT  2005   BiV ICD implanted,  LV lead is an epicardial lead   CARPAL TUNNEL RELEASE Left 07/25/2014   Dr.Williamson    CARPAL TUNNEL RELEASE Right 04/08/2019   Procedure: RIGHT CARPAL TUNNEL RELEASE ENDOSCOPIC;  Surgeon: Milly Jakob, MD;  Location: [facility];  Service: Orthopedics;  Laterality: Right;   CATARACT EXTRACTION W/ INTRAOCULAR LENS  IMPLANT, BILATERAL Bilateral    COLONIC STENT PLACEMENT N/A 08/31/2017   Procedure: COLONIC STENT PLACEMENT;  Surgeon: Carol Ada, MD;  Location: [facility];  Service: Endoscopy;  Laterality: N/A;   COLONOSCOPY     2010-2011 Dr.Kipreos    COLOSTOMY  12/2016   Archie Endo 01/26/2017   COLOSTOMY REVERSAL N/A 07/13/2017   Procedure: COLOSTOMY REVERSAL;  Surgeon: Judeth Horn, MD;  Location: [facility];  Service: General;  Laterality: N/A;   CYST REMOVAL HAND Right 05/2013   thumb   DILATION AND CURETTAGE OF UTERUS  X 5-6   ESOPHAGOGASTRODUODENOSCOPY (EGD) WITH PROPOFOL N/A 03/13/2020   Procedure: ESOPHAGOGASTRODUODENOSCOPY (EGD) WITH PROPOFOL;  Surgeon: Carol Ada, MD;  [facility];  Service: Endoscopy;  Laterality: N/A;   EXCISION MASS ABDOMINAL N/A 07/09/2018   Procedure: EXPLORATION OF  ABDOMINAL WOUND ERAS PATHWAY;  Surgeon: Judeth Horn, MD;  Location: [facility];  Service: General;  Laterality: N/A;   FLEXIBLE SIGMOIDOSCOPY N/A  08/24/2017   Procedure: Beryle Quant;  Surgeon: Carol Ada, MD;  Location: [facility];  Service: Endoscopy;  Laterality: N/A;   FLEXIBLE SIGMOIDOSCOPY N/A 08/31/2017   Procedure: FLEXIBLE SIGMOIDOSCOPY;  Surgeon: Carol Ada, MD;  Location: [facility];  Service: Endoscopy;  Laterality: N/A;  stent placement   FLEXIBLE SIGMOIDOSCOPY N/A 03/13/2020   Procedure: FLEXIBLE SIGMOIDOSCOPY;  Surgeon: Carol Ada, MD;  [facility];  Service: Endoscopy;  Laterality: N/A;   FLEXIBLE SIGMOIDOSCOPY N/A 12/18/2020   Procedure: FLEXIBLE SIGMOIDOSCOPY;  Surgeon: Carol Ada, MD;  [facility];  Service: Endoscopy;  Laterality: N/A;   HEMOSTASIS CLIP PLACEMENT  03/13/2020   Procedure: HEMOSTASIS CLIP PLACEMENT;  Surgeon: Carol Ada, MD;  [facility];  Service: Endoscopy;;   HEMOSTASIS CLIP PLACEMENT  12/18/2020   Procedure: HEMOSTASIS CLIP PLACEMENT;  Surgeon: Carol Ada, MD;  [facility];  Service: Endoscopy;;   HOT HEMOSTASIS N/A 03/13/2020   Procedure: HOT HEMOSTASIS (ARGON PLASMA COAGULATION/BICAP);  Surgeon: Carol Ada, MD;  [facility];  Service: Endoscopy;  Laterality: N/A;   HOT HEMOSTASIS N/A 12/18/2020   Procedure: HOT HEMOSTASIS (ARGON PLASMA COAGULATION/BICAP);  Surgeon: Carol Ada, MD;  Location[facility];  Service: Endoscopy;  Laterality: N/A;   IMPLANTABLE CARDIOVERTER DEFIBRILLATOR GENERATOR CHANGE  2008   INSERTION OF MESH N/A 07/09/2018   Procedure: INSERTION OF VICRYL MESH;  Surgeon: Judeth Horn, MD;  Location: [facility];  Service: General;  Laterality: N/A;   LAPAROTOMY N/A 09/18/2017   Procedure: EXPLORATORY LAPAROTOMY;  Surgeon: Judeth Horn, MD;  Location: [facility];  Service: General;  Laterality: N/A;   LEAD REVISION  06/22/2018   LEAD REVISION/REPAIR N/A 06/22/2018   Procedure:  LEAD REVISION/REPAIR;  Surgeon: Deboraha Sprang, MD;  Location: [facility];  Service: Cardiovascular;  Laterality: N/A;    LYSIS OF ADHESION N/A 09/18/2017   Procedure: LYSIS OF ADHESION;  Surgeon: Judeth Horn, MD;  Location: [facility] [place];  Service: General;  Laterality: N/A;   LYSIS OF ADHESION N/A 07/09/2018   Procedure: LYSIS OF ADHESION;  Surgeon: Judeth Horn, MD;  Location: [facility];  Service: General;  Laterality: N/A;   PARTIAL COLECTOMY N/A 09/18/2017   Procedure: ILEOCOLECTOMY;  Surgeon: Judeth Horn, MD;  Location: [facility];  Service: General;  Laterality: N/A;   PARTIAL COLECTOMY  09/25/2017   POLYPECTOMY  03/13/2020   Procedure: POLYPECTOMY;  Surgeon: Carol Ada, MD;  [facility];  Service: Endoscopy;;   RIGHT/LEFT HEART CATH AND CORONARY ANGIOGRAPHY N/A 06/01/2018   Procedure: RIGHT/LEFT HEART CATH AND CORONARY ANGIOGRAPHY;  Surgeon: Jettie Booze, MD;  Location: [facility];  Service: Cardiovascular;  Laterality: N/A;   SIGMOIDOSCOPY N/A 09/18/2017   Procedure: SIGMOIDOSCOPY;  Surgeon: Judeth Horn, MD;  Location: [facility];  Service: General;  Laterality: N/A;   SMALL INTESTINE SURGERY  07/09/2018   EXPLORATION OF  ABDOMINAL WOUND ERAS PATHWAYN/; MESH; LYSIS OF ADHESIONS   TOTAL ABDOMINAL HYSTERECTOMY  1990   TRANSFORAMINAL LUMBAR INTERBODY FUSION (TLIF) WITH PEDICLE SCREW FIXATION 2 LEVEL N/A 01/07/2020   Procedure: Lumbar Four-Five and Lumbar Five-Sacral One open lumbar decompression and transforaminal lumbar interbody fusion;  Surgeon: Judith Part, MD;  Location: [facility];  Service: Neurosurgery;  Laterality: N/A;  Lumbar Four-Five and Lumbar Five-Sacral One open lumbar decompression and transforaminal lumbar interbody fusion   TRIGGER FINGER RELEASE Right 05/213   TRIGGER FINGER RELEASE Right 04/08/2019   Procedure: RIGHT INDEX RELEASE TRIGGER FINGER/A-1 PULLEY;  Surgeon: Milly Jakob, MD;  Location: [place];  Service: Orthopedics;  Laterality: Right;   TUBAL LIGATION  1980s   VESICOVAGINAL FISTULA CLOSURE W/ TAH     Past Medical History:  Diagnosis Date   AICD (automatic  cardioverter/defibrillator) present    high RV threshold chronically, device was turned off in 2014; Device battery has been dead x 7 years; "turned it back on 06/22/2018"   Anemia, iron deficiency    Angioedema    felt to likely be due to ace inhibitors but says she has had this even off of medicines, appears to be tolerating ARBs chronically   Anxiety    Arthritis    "hands, legs, arms; bad in my back" (06/22/2018)   Asthmatic bronchitis with status asthmaticus    Bradycardia    CHF (congestive heart failure) (HCC)    Chronic lower back pain    Chronic pain    CKD (chronic kidney disease), stage III ([place])    Coronary artery disease    Mild nonobstructive (30% LAD, 30% RCA) by 09/01/16 cath at [facility]   Degenerative joint disease    Depression    Diabetes mellitus, type 2 ([place])    Diabetic peripheral neuropathy ([place])    Dizziness    Dyspnea on exertion    Family history of adverse reaction to anesthesia    Mother has nausea   GERD (gastroesophageal reflux disease)    Gout    Heart valve disorder    History of blood transfusion 1981; ~ 2005; 12/2016   "childbirth; defibrillator OR; colostomy OR"   History of mononucleosis 03/2014   Hyperlipidemia    Hypertension    Hypothyroid    Insomnia    Intervertebral disc  degeneration    Ischemic cardiomyopathy    LBBB (left bundle branch block)    Myocardial infarction [facility]) dx'd ~ 2005   Nonischemic cardiomyopathy ([place])    s/p ICD in 2005. EF has since recovered   Obesity    On home oxygen therapy    "2L at night" (06/22/2018)   OSA on CPAP    Pneumonia    "couple times; last time was 12/2016" (06/22/2018)   PONV (postoperative nausea and vomiting)    Presence of permanent cardiac pacemaker    [employer]   Swelling    Syncope    Systemic hypertension    Vitamin D deficiency    There were no vitals taken for this visit.  Opioid Risk Score:   Fall Risk Score:  `1  Depression screen [facility] 2/9      03/08/2022   10:19 AM 01/26/2022    9:54 AM 01/03/2022   11:24 AM 12/29/2021   10:23 AM 11/24/2021   11:24 AM 10/26/2021   10:22 AM 09/28/2021   10:20 AM  Depression screen PHQ 2/9  Decreased Interest 0 0 0 0 0 0 0  Down, Depressed, Hopeless 0 0 0 0 0 0 0  PHQ - 2 Score 0 0 0 0 0 0 0    Review of Systems  Constitutional: Negative.   HENT: Negative.    Eyes: Negative.   Respiratory: Negative.    Endocrine: Negative.   Genitourinary: Negative.   Musculoskeletal:  Positive for arthralgias, back pain, joint swelling and myalgias.  Skin: Negative.   Allergic/Immunologic: Negative.   Neurological: Negative.   Hematological: Negative.   Psychiatric/Behavioral: Negative.        Objective:   Physical Exam Vitals and nursing note reviewed.  Constitutional:      Appearance: Normal appearance. She is obese.  Cardiovascular:     Rate and Rhythm: Normal rate and regular rhythm.     Pulses: Normal pulses.     Heart sounds: Normal heart sounds.  Pulmonary:     Effort: Pulmonary effort is normal.     Breath sounds: Normal breath sounds.  Musculoskeletal:     Cervical back: Normal range of motion and neck supple.     Comments: Normal Muscle Bulk and Muscle Testing Reveals:  Upper Extremities: Full ROM and Muscle Strength 5/5 Lumbar Paraspinal Tenderness: L-4-L-5  Lower Extremities: Full ROM and Muscle Strength 5/5  Arises from Table Slowly Antalgic  Gait     Skin:    General: Skin is warm and dry.  Neurological:     Mental Status: She is alert and oriented to person, place, and time.  Psychiatric:        Mood and Affect: Mood normal.        Behavior: Behavior normal.         Assessment & Plan:  Chronic Low Back Pain: Encouraged to increase HEP as Tolerated. Continue to Monitor.  05/24/2022 Bilateral Primary Osteoarthritis:  S/P Right Knee Genicular nerve radiofrequency neurotomy, with Dr Letta Pate. On 09/28/2021 and S/P Left Knee Genicular Nerve Radiofrequency on 10/05/2021. With  good relief noted.   Indication Chronic post operative pain in the Knee, pain postop total knee replacement which has not responded to conservative management such as physical therapy and medication management; Continue monitor. 05/24/2022 3. Myofascial Pain: S/P Trigger Point Injection on 06/29/2021 with Good Relief Noted. 05/24/2022 4. Chronic Pain Syndrome: Refilled: Oxycodone '5mg'$ /325 mg one tablet three times a day as needed for pain #60, Discontinue Hydrocodone due  to itching, Ms. Isbell verbalizes understanding.  We will continue the opioid monitoring program, this consists of regular clinic visits, examinations, urine drug screen, pill counts as well as use of [place] Controlled Substance Reporting system. A 12 month History has been reviewed on the [place] Controlled Substance Reporting System on 05/24/2022.  F/U in 1 month

## 2022-05-25 DIAGNOSIS — I129 Hypertensive chronic kidney disease with stage 1 through stage 4 chronic kidney disease, or unspecified chronic kidney disease: Secondary | ICD-10-CM | POA: Diagnosis not present

## 2022-05-25 DIAGNOSIS — I5042 Chronic combined systolic (congestive) and diastolic (congestive) heart failure: Secondary | ICD-10-CM | POA: Diagnosis not present

## 2022-05-25 DIAGNOSIS — D509 Iron deficiency anemia, unspecified: Secondary | ICD-10-CM | POA: Diagnosis not present

## 2022-05-25 DIAGNOSIS — N2581 Secondary hyperparathyroidism of renal origin: Secondary | ICD-10-CM | POA: Diagnosis not present

## 2022-05-25 DIAGNOSIS — E1122 Type 2 diabetes mellitus with diabetic chronic kidney disease: Secondary | ICD-10-CM | POA: Diagnosis not present

## 2022-05-25 DIAGNOSIS — N184 Chronic kidney disease, stage 4 (severe): Secondary | ICD-10-CM | POA: Diagnosis not present

## 2022-05-25 DIAGNOSIS — B3731 Acute candidiasis of vulva and vagina: Secondary | ICD-10-CM | POA: Diagnosis not present

## 2022-05-31 ENCOUNTER — Ambulatory Visit: Payer: Self-pay

## 2022-05-31 NOTE — Patient Outreach (Signed)
  Care Coordination   Follow Up Visit Note   05/31/2022 Name: Crystal [name] MRN: [id number] DOB: [birth year]  Crystal [name] is a 71 y.o. year old female who sees Crist Infante, MD for primary care. I spoke with  Crystal [name] by phone today.  What matters to the patients health and wellness today?  Patient continues to make efforts to improve her diabetes.     Goals Addressed               This Visit's Progress     Patient Stated     I would like to continue to improve my diabetes (pt-stated)        Care Coordination Interventions:  Assessed patient's understanding of A1c goal: <7% Lab Results  Component Value Date   HGBA1C 8.5 (H) 10/27/2021  Determined patient feels her daily CBG's are running higher than she would like  Determined patient recently suffered from Influenza that lasted 2 weeks, she notified Dr. Joylene Draft about having this condition and elevated sugars Determined Dr. Joylene Draft has scheduled a face to face visit with his diabetes educator Juliann Pulse and this will occur in the near future Offered support and encouragement to patient for making efforts to improve her diabetes             SDOH assessments and interventions completed:  No     Care Coordination Interventions Activated:  Yes  Care Coordination Interventions:  Yes, provided   Follow up plan: Follow up call scheduled for 08/01/22 '@1030'$  AM    Encounter Outcome:  Pt. Visit Completed

## 2022-05-31 NOTE — Patient Instructions (Signed)
Visit Information  Thank you for taking time to visit with me today. Please don't hesitate to contact me if I can be of assistance to you.   Following are the goals we discussed today:   Goals Addressed               This Visit's Progress     Patient Stated     I would like to continue to improve my diabetes (pt-stated)        Care Coordination Interventions:  Assessed patient's understanding of A1c goal: <7% Lab Results  Component Value Date   HGBA1C 8.5 (H) 10/27/2021  Determined patient feels her daily CBG's are running higher than she would like  Determined patient recently suffered from Influenza that lasted 2 weeks, she notified Dr. Joylene Draft about having this condition and elevated sugars Determined Dr. Joylene Draft has scheduled a face to face visit with his diabetes educator Juliann Pulse and this will occur in the near future Offered support and encouragement to patient for making efforts to improve her diabetes             Our next appointment is by telephone on 08/01/22 at 1030 AM  Please call the care guide team at [phone] if you need to cancel or reschedule your appointment.   If you are experiencing a Mental Health or [place] or need someone to talk to, please call [phone]-TALK (toll free, 24 hour hotline)  Patient verbalizes understanding of instructions and care plan provided today and agrees to view in [place]. Active MyChart status and patient understanding of how to access instructions and care plan via MyChart confirmed with patient.     Barb Merino, RN, BSN, CCM Care Management Coordinator [facility] Care Management Direct Phone: [phone]

## 2022-06-01 ENCOUNTER — Other Ambulatory Visit (HOSPITAL_COMMUNITY): Payer: Self-pay | Admitting: *Deleted

## 2022-06-02 ENCOUNTER — Ambulatory Visit (HOSPITAL_COMMUNITY)
Admission: RE | Admit: 2022-06-02 | Discharge: 2022-06-02 | Disposition: A | Discharge status: Home / Self Care | Payer: Medicare Other | Source: Ambulatory Visit | Attending: Nephrology | Admitting: Nephrology

## 2022-06-02 DIAGNOSIS — D631 Anemia in chronic kidney disease: Secondary | ICD-10-CM | POA: Diagnosis not present

## 2022-06-02 MED ORDER — SODIUM CHLORIDE 0.9 % IV SOLN
510.0000 mg | Freq: Once | INTRAVENOUS | Status: AC
  Administered 2022-06-02: 510 mg via INTRAVENOUS
  Filled 2022-06-02: qty 510

## 2022-06-21 ENCOUNTER — Encounter: Payer: Self-pay | Admitting: Registered Nurse

## 2022-06-21 ENCOUNTER — Encounter: Payer: Medicare Other | Attending: Registered Nurse | Admitting: Registered Nurse

## 2022-06-21 VITALS — BP 118/64 | HR 75 | Ht 64.0 in | Wt 249.0 lb

## 2022-06-21 DIAGNOSIS — G8929 Other chronic pain: Secondary | ICD-10-CM

## 2022-06-21 DIAGNOSIS — M2550 Pain in unspecified joint: Secondary | ICD-10-CM

## 2022-06-21 DIAGNOSIS — Z79891 Long term (current) use of opiate analgesic: Secondary | ICD-10-CM

## 2022-06-21 DIAGNOSIS — G894 Chronic pain syndrome: Secondary | ICD-10-CM | POA: Diagnosis not present

## 2022-06-21 DIAGNOSIS — Z5181 Encounter for therapeutic drug level monitoring: Secondary | ICD-10-CM | POA: Diagnosis not present

## 2022-06-21 DIAGNOSIS — M170 Bilateral primary osteoarthritis of knee: Secondary | ICD-10-CM

## 2022-06-21 DIAGNOSIS — M545 Low back pain, unspecified: Secondary | ICD-10-CM

## 2022-06-21 DIAGNOSIS — M7918 Myalgia, other site: Secondary | ICD-10-CM

## 2022-06-21 MED ORDER — OXYCODONE-ACETAMINOPHEN 5-325 MG PO TABS: 1.0000 | ORAL_TABLET | Freq: Three times a day (TID) | ORAL | 0 refills | Status: DC | PRN

## 2022-06-21 NOTE — Progress Notes (Signed)
Subjective:    Patient ID: Crystal [name], female    DOB: 09-14-1950, 71 y.o.   MRN: [id number]  HPI: Crystal [name] is a 71 y.o. female who returns for follow up appointment for chronic pain and medication refill. She states her pain is located in her lower back radiating into her right hip. Also reports bilateral knee pain R>L, asked about nerve block. Last Right knee Genicular nerve radiofrequency neurotomy was   on 09/28/2021 with good relief noted. Left Genicular nerve radiofrequency neurotomy  was on 10/05/2021 with good relief noted. [name] will be scheduled for Right and Left Genicular nerve radiofrequency neurotomy.  She rates her pain 8. Her current exercise regime is walking short distances using cane.   [name] Morphine equivalent is 15.00 MME.   Last UDS was Performed on 03/08/2022, it was consistent.      Pain Inventory Average Pain 10 Pain Right Now 8 My pain is intermittent, constant, sharp, burning, stabbing, and aching  In the last 24 hours, has pain interfered with the following? General activity 7 Relation with others 7 Enjoyment of life 7 What TIME of day is your pain at its worst? morning , evening, and night Sleep (in general) Good  Pain is worse with: walking, bending, standing, and some activites Pain improves with: rest, heat/ice, medication, and injections Relief from Meds: 7  Family History  Problem Relation Age of Onset   Hypertension Father    Heart disease Father    Cancer Father        prostate   Heart disease Other        (Maternal side) Ischemic heart disease   Diabetes Mellitus II Other    Arrhythmia Mother    Diabetes Mellitus II Mother        Borderline DM   Hypertension Mother    Asthma Mother    Cancer Brother    Dementia Brother    Hypertension Brother    Hypertension Daughter    Hypertension Daughter    Diabetes Daughter    Hypertension Daughter    Atrial fibrillation Daughter    GER disease Daughter    Hypertension  Son    Anxiety disorder Son    Hypothyroidism Son    Breast cancer Maternal Grandmother        in her 25's   Social History   Socioeconomic History   Marital status: Widowed    Spouse name: Not on file   Number of children: Not on file   Years of education: Not on file   Highest education level: Not on file  Occupational History    Comment: retired LPN  Tobacco Use   Smoking status: Never   Smokeless tobacco: Never  Vaping Use   Vaping Use: Never used  Substance and Sexual Activity   Alcohol use: Not Currently   Drug use: Never   Sexual activity: Not Currently  Other Topics Concern   Not on file  Social History Narrative   Pt lives in [place] alone.  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      As of 03/15/17:   Diet: 1800 Calorie      Caffeine: Yes      Married, if yes what year: Widowed, married 1973      Do you live in a house, apartment, assisted living, [place], trailer, ect: House, 1 stories, and 1 person      Pets: No  Current/Past profession: LPN, retired       Exercise: Yes, walking          Living Will: Yes   DNR: No   POA/HPOA: Yes      Functional Status:   Do you have difficulty bathing or dressing yourself? No   Do you have difficulty preparing food or eating? No   Do you have difficulty managing your medications? No   Do you have difficulty managing your finances? No   Do you have difficulty affording your medications? Yes   Social Determinants of Health   Financial Resource Strain: Low Risk  (10/11/2017)   Overall Financial Resource Strain (CARDIA)    Difficulty of Paying Living Expenses: Not hard at all  Food Insecurity: No Food Insecurity (01/03/2022)   Hunger Vital Sign    Worried About Running Out of Food in the Last Year: Never true    Ran Out of Food in the Last Year: Never true  Transportation Needs: No Transportation Needs (01/03/2022)   PRAPARE - Radiographer, therapeutic (Medical): No    Lack of Transportation (Non-Medical): No  Physical Activity: Inactive (10/11/2017)   Exercise Vital Sign    Days of Exercise per Week: 0 days    Minutes of Exercise per Session: 0 min  Stress: Stress Concern Present (10/11/2017)   [place]    Feeling of Stress : To some extent  Social Connections: Somewhat Isolated (10/11/2017)   Social Connection and Isolation Panel [NHANES]    Frequency of Communication with Friends and Family: More than three times a week    Frequency of Social Gatherings with Friends and Family: More than three times a week    Attends Religious Services: More than 4 times per year    Active Member of [employer] or Organizations: No    Attends Archivist Meetings: Never    Marital Status: Widowed   Past Surgical History:  Procedure Laterality Date   APPENDECTOMY  07/13/2017   BLADDER SUSPENSION  1990   "w/hysterectomy"   BOWEL RESECTION N/A 07/09/2018   Procedure: SMALL BOWEL RESECTION;  Surgeon: Judeth Horn, MD;  Location: [facility];  Service: General;  Laterality: N/A;   CARDIAC CATHETERIZATION  2005   no obstructive CAD per patient   CARDIAC CATHETERIZATION  2018   CARDIAC DEFIBRILLATOR PLACEMENT  2005   BiV ICD implanted,  LV lead is an epicardial lead   CARPAL TUNNEL RELEASE Left 07/25/2014   Dr.Williamson    CARPAL TUNNEL RELEASE Right 04/08/2019   Procedure: RIGHT CARPAL TUNNEL RELEASE ENDOSCOPIC;  Surgeon: Milly Jakob, MD;  Location: [facility];  Service: Orthopedics;  Laterality: Right;   CATARACT EXTRACTION W/ INTRAOCULAR LENS  IMPLANT, BILATERAL Bilateral    COLONIC STENT PLACEMENT N/A 08/31/2017   Procedure: COLONIC STENT PLACEMENT;  Surgeon: Carol Ada, MD;  Location: [facility];  Service: Endoscopy;  Laterality: N/A;   COLONOSCOPY     2010-2011 Dr.Kipreos    COLOSTOMY  12/2016   Archie Endo 01/26/2017   COLOSTOMY REVERSAL N/A 07/13/2017   Procedure:  COLOSTOMY REVERSAL;  Surgeon: Judeth Horn, MD;  Location: [facility];  Service: General;  Laterality: N/A;   CYST REMOVAL HAND Right 05/2013   thumb   DILATION AND CURETTAGE OF UTERUS  X 5-6   ESOPHAGOGASTRODUODENOSCOPY (EGD) WITH PROPOFOL N/A 03/13/2020   Procedure: ESOPHAGOGASTRODUODENOSCOPY (EGD) WITH PROPOFOL;  Surgeon: Carol Ada, MD;  [facility];  Service: Endoscopy;  Laterality:  N/A;   EXCISION MASS ABDOMINAL N/A 07/09/2018   Procedure: EXPLORATION OF  ABDOMINAL WOUND ERAS PATHWAY;  Surgeon: Judeth Horn, MD;  Location: [facility];  Service: General;  Laterality: N/A;   FLEXIBLE SIGMOIDOSCOPY N/A 08/24/2017   Procedure: Beryle Quant;  Surgeon: Carol Ada, MD;  Location: [facility];  Service: Endoscopy;  Laterality: N/A;   FLEXIBLE SIGMOIDOSCOPY N/A 08/31/2017   Procedure: FLEXIBLE SIGMOIDOSCOPY;  Surgeon: Carol Ada, MD;  Location: [facility];  Service: Endoscopy;  Laterality: N/A;  stent placement   FLEXIBLE SIGMOIDOSCOPY N/A 03/13/2020   Procedure: FLEXIBLE SIGMOIDOSCOPY;  Surgeon: Carol Ada, MD;  [facility];  Service: Endoscopy;  Laterality: N/A;   FLEXIBLE SIGMOIDOSCOPY N/A 12/18/2020   Procedure: FLEXIBLE SIGMOIDOSCOPY;  Surgeon: Carol Ada, MD;  [facility];  Service: Endoscopy;  Laterality: N/A;   HEMOSTASIS CLIP PLACEMENT  03/13/2020   Procedure: HEMOSTASIS CLIP PLACEMENT;  Surgeon: Carol Ada, MD;  [facility];  Service: Endoscopy;;   HEMOSTASIS CLIP PLACEMENT  12/18/2020   Procedure: HEMOSTASIS CLIP PLACEMENT;  Surgeon: Carol Ada, MD;  [facility];  Service: Endoscopy;;   HOT HEMOSTASIS N/A 03/13/2020   Procedure: HOT HEMOSTASIS (ARGON PLASMA COAGULATION/BICAP);  Surgeon: Carol Ada, MD;  Location[facility];  Service: Endoscopy;  Laterality: N/A;   HOT HEMOSTASIS N/A 12/18/2020   Procedure: HOT HEMOSTASIS (ARGON PLASMA COAGULATION/BICAP);  Surgeon: Carol Ada, MD;  Location[facility];   Service: Endoscopy;  Laterality: N/A;   IMPLANTABLE CARDIOVERTER DEFIBRILLATOR GENERATOR CHANGE  2008   INSERTION OF MESH N/A 07/09/2018   Procedure: INSERTION OF VICRYL MESH;  Surgeon: Judeth Horn, MD;  Location: [facility];  Service: General;  Laterality: N/A;   LAPAROTOMY N/A 09/18/2017   Procedure: EXPLORATORY LAPAROTOMY;  Surgeon: Judeth Horn, MD;  Location: [facility];  Service: General;  Laterality: N/A;   LEAD REVISION  06/22/2018   LEAD REVISION/REPAIR N/A 06/22/2018   Procedure: LEAD REVISION/REPAIR;  Surgeon: Deboraha Sprang, MD;  Location: [facility];  Service: Cardiovascular;  Laterality: N/A;   LYSIS OF ADHESION N/A 09/18/2017   Procedure: LYSIS OF ADHESION;  Surgeon: Judeth Horn, MD;  Location: [facility];  Service: General;  Laterality: N/A;   LYSIS OF ADHESION N/A 07/09/2018   Procedure: LYSIS OF ADHESION;  Surgeon: Judeth Horn, MD;  Location: [facility];  Service: General;  Laterality: N/A;   PARTIAL COLECTOMY N/A 09/18/2017   Procedure: ILEOCOLECTOMY;  Surgeon: Judeth Horn, MD;  Location: [facility];  Service: General;  Laterality: N/A;   PARTIAL COLECTOMY  09/25/2017   POLYPECTOMY  03/13/2020   Procedure: POLYPECTOMY;  Surgeon: Carol Ada, MD;  [facility];  Service: Endoscopy;;   RIGHT/LEFT HEART CATH AND CORONARY ANGIOGRAPHY N/A 06/01/2018   Procedure: RIGHT/LEFT HEART CATH AND CORONARY ANGIOGRAPHY;  Surgeon: Jettie Booze, MD;  Location: [facility];  Service: Cardiovascular;  Laterality: N/A;   SIGMOIDOSCOPY N/A 09/18/2017   Procedure: SIGMOIDOSCOPY;  Surgeon: Judeth Horn, MD;  Location: [facility];  Service: General;  Laterality: N/A;   SMALL INTESTINE SURGERY  07/09/2018   EXPLORATION OF  ABDOMINAL WOUND ERAS PATHWAYN/; MESH; LYSIS OF ADHESIONS   TOTAL ABDOMINAL HYSTERECTOMY  1990   TRANSFORAMINAL LUMBAR INTERBODY FUSION (TLIF) WITH PEDICLE SCREW FIXATION 2 LEVEL N/A 01/07/2020   Procedure: Lumbar Four-Five and Lumbar Five-Sacral One open lumbar decompression  and transforaminal lumbar interbody fusion;  Surgeon: Judith Part, MD;  Location: [facility];  Service: Neurosurgery;  Laterality: N/A;  Lumbar Four-Five and Lumbar Five-Sacral One open  lumbar decompression and transforaminal lumbar interbody fusion   TRIGGER FINGER RELEASE Right 05/213   TRIGGER FINGER RELEASE Right 04/08/2019   Procedure: RIGHT INDEX RELEASE TRIGGER FINGER/A-1 PULLEY;  Surgeon: Milly Jakob, MD;  Location: [facility];  Service: Orthopedics;  Laterality: Right;   TUBAL LIGATION  1980s   VESICOVAGINAL FISTULA CLOSURE W/ TAH     Past Surgical History:  Procedure Laterality Date   APPENDECTOMY  07/13/2017   BLADDER SUSPENSION  1990   "w/hysterectomy"   BOWEL RESECTION N/A 07/09/2018   Procedure: SMALL BOWEL RESECTION;  Surgeon: Judeth Horn, MD;  Location: [facility];  Service: General;  Laterality: N/A;   CARDIAC CATHETERIZATION  2005   no obstructive CAD per patient   CARDIAC CATHETERIZATION  2018   CARDIAC DEFIBRILLATOR PLACEMENT  2005   BiV ICD implanted,  LV lead is an epicardial lead   CARPAL TUNNEL RELEASE Left 07/25/2014   Dr.Williamson    CARPAL TUNNEL RELEASE Right 04/08/2019   Procedure: RIGHT CARPAL TUNNEL RELEASE ENDOSCOPIC;  Surgeon: Milly Jakob, MD;  Location: [facility];  Service: Orthopedics;  Laterality: Right;   CATARACT EXTRACTION W/ INTRAOCULAR LENS  IMPLANT, BILATERAL Bilateral    COLONIC STENT PLACEMENT N/A 08/31/2017   Procedure: COLONIC STENT PLACEMENT;  Surgeon: Carol Ada, MD;  Location: [facility];  Service: Endoscopy;  Laterality: N/A;   COLONOSCOPY     2010-2011 Dr.Kipreos    COLOSTOMY  12/2016   Archie Endo 01/26/2017   COLOSTOMY REVERSAL N/A 07/13/2017   Procedure: COLOSTOMY REVERSAL;  Surgeon: Judeth Horn, MD;  Location: [facility];  Service: General;  Laterality: N/A;   CYST REMOVAL HAND Right 05/2013   thumb   DILATION AND CURETTAGE OF UTERUS  X 5-6   ESOPHAGOGASTRODUODENOSCOPY (EGD) WITH PROPOFOL N/A 03/13/2020   Procedure:  ESOPHAGOGASTRODUODENOSCOPY (EGD) WITH PROPOFOL;  Surgeon: Carol Ada, MD;  [facility];  Service: Endoscopy;  Laterality: N/A;   EXCISION MASS ABDOMINAL N/A 07/09/2018   Procedure: EXPLORATION OF  ABDOMINAL WOUND ERAS PATHWAY;  Surgeon: Judeth Horn, MD;  Location: [facility];  Service: General;  Laterality: N/A;   FLEXIBLE SIGMOIDOSCOPY N/A 08/24/2017   Procedure: Beryle Quant;  Surgeon: Carol Ada, MD;  Location: [facility];  Service: Endoscopy;  Laterality: N/A;   FLEXIBLE SIGMOIDOSCOPY N/A 08/31/2017   Procedure: FLEXIBLE SIGMOIDOSCOPY;  Surgeon: Carol Ada, MD;  Location: [place];  Service: Endoscopy;  Laterality: N/A;  stent placement   FLEXIBLE SIGMOIDOSCOPY N/A 03/13/2020   Procedure: FLEXIBLE SIGMOIDOSCOPY;  Surgeon: Carol Ada, MD;  [facility];  Service: Endoscopy;  Laterality: N/A;   FLEXIBLE SIGMOIDOSCOPY N/A 12/18/2020   Procedure: FLEXIBLE SIGMOIDOSCOPY;  Surgeon: Carol Ada, MD;  [facility];  Service: Endoscopy;  Laterality: N/A;   HEMOSTASIS CLIP PLACEMENT  03/13/2020   Procedure: HEMOSTASIS CLIP PLACEMENT;  Surgeon: Carol Ada, MD;  [facility];  Service: Endoscopy;;   HEMOSTASIS CLIP PLACEMENT  12/18/2020   Procedure: HEMOSTASIS CLIP PLACEMENT;  Surgeon: Carol Ada, MD;  [facility];  Service: Endoscopy;;   HOT HEMOSTASIS N/A 03/13/2020   Procedure: HOT HEMOSTASIS (ARGON PLASMA COAGULATION/BICAP);  Surgeon: Carol Ada, MD;  [facility];  Service: Endoscopy;  Laterality: N/A;   HOT HEMOSTASIS N/A 12/18/2020   Procedure: HOT HEMOSTASIS (ARGON PLASMA COAGULATION/BICAP);  Surgeon: Carol Ada, MD;  Location[facility];  Service: Endoscopy;  Laterality: N/A;   IMPLANTABLE CARDIOVERTER DEFIBRILLATOR GENERATOR CHANGE  2008   INSERTION OF MESH N/A 07/09/2018   Procedure: INSERTION OF VICRYL MESH;  Surgeon: Judeth Horn, MD;  Location: [facility];  Service: General;  Laterality: N/A;    LAPAROTOMY N/A 09/18/2017   Procedure: EXPLORATORY LAPAROTOMY;  Surgeon: Judeth Horn, MD;  Location: [facility];  Service: General;  Laterality: N/A;   LEAD REVISION  06/22/2018   LEAD REVISION/REPAIR N/A 06/22/2018   Procedure: LEAD REVISION/REPAIR;  Surgeon: Deboraha Sprang, MD;  Location: [facility];  Service: Cardiovascular;  Laterality: N/A;   LYSIS OF ADHESION N/A 09/18/2017   Procedure: LYSIS OF ADHESION;  Surgeon: Judeth Horn, MD;  Location: [facility];  Service: General;  Laterality: N/A;   LYSIS OF ADHESION N/A 07/09/2018   Procedure: LYSIS OF ADHESION;  Surgeon: Judeth Horn, MD;  Location: [facility];  Service: General;  Laterality: N/A;   PARTIAL COLECTOMY N/A 09/18/2017   Procedure: ILEOCOLECTOMY;  Surgeon: Judeth Horn, MD;  Location: [facility];  Service: General;  Laterality: N/A;   PARTIAL COLECTOMY  09/25/2017   POLYPECTOMY  03/13/2020   Procedure: POLYPECTOMY;  Surgeon: Carol Ada, MD;  [facility];  Service: Endoscopy;;   RIGHT/LEFT HEART CATH AND CORONARY ANGIOGRAPHY N/A 06/01/2018   Procedure: RIGHT/LEFT HEART CATH AND CORONARY ANGIOGRAPHY;  Surgeon: Jettie Booze, MD;  Location: [facility];  Service: Cardiovascular;  Laterality: N/A;   SIGMOIDOSCOPY N/A 09/18/2017   Procedure: SIGMOIDOSCOPY;  Surgeon: Judeth Horn, MD;  Location: [facility];  Service: General;  Laterality: N/A;   SMALL INTESTINE SURGERY  07/09/2018   EXPLORATION OF  ABDOMINAL WOUND ERAS PATHWAYN/; MESH; LYSIS OF ADHESIONS   TOTAL ABDOMINAL HYSTERECTOMY  1990   TRANSFORAMINAL LUMBAR INTERBODY FUSION (TLIF) WITH PEDICLE SCREW FIXATION 2 LEVEL N/A 01/07/2020   Procedure: Lumbar Four-Five and Lumbar Five-Sacral One open lumbar decompression and transforaminal lumbar interbody fusion;  Surgeon: Judith Part, MD;  Location: [facility];  Service: Neurosurgery;  Laterality: N/A;  Lumbar Four-Five and Lumbar Five-Sacral One open lumbar decompression and transforaminal lumbar interbody fusion   TRIGGER  FINGER RELEASE Right 05/213   TRIGGER FINGER RELEASE Right 04/08/2019   Procedure: RIGHT INDEX RELEASE TRIGGER FINGER/A-1 PULLEY;  Surgeon: Milly Jakob, MD;  Location: [facility];  Service: Orthopedics;  Laterality: Right;   TUBAL LIGATION  1980s   VESICOVAGINAL FISTULA CLOSURE W/ TAH     Past Medical History:  Diagnosis Date   AICD (automatic cardioverter/defibrillator) present    high RV threshold chronically, device was turned off in 2014; Device battery has been dead x 7 years; "turned it back on 06/22/2018"   Anemia, iron deficiency    Angioedema    felt to likely be due to ace inhibitors but says she has had this even off of medicines, appears to be tolerating ARBs chronically   Anxiety    Arthritis    "hands, legs, arms; bad in my back" (06/22/2018)   Asthmatic bronchitis with status asthmaticus    Bradycardia    CHF (congestive heart failure) (HCC)    Chronic lower back pain    Chronic pain    CKD (chronic kidney disease), stage III (HCC)    Coronary artery disease    Mild nonobstructive (30% LAD, 30% RCA) by 09/01/16 cath at [facility]   Degenerative joint disease    Depression    Diabetes mellitus, type 2 ([place])    Diabetic peripheral neuropathy (HCC)    Dizziness    Dyspnea on exertion    Family history of adverse reaction to anesthesia    Mother has nausea   GERD (gastroesophageal reflux disease)    Gout  Heart valve disorder    History of blood transfusion 1981; ~ 2005; 12/2016   "childbirth; defibrillator OR; colostomy OR"   History of mononucleosis 03/2014   Hyperlipidemia    Hypertension    Hypothyroid    Insomnia    Intervertebral disc degeneration    Ischemic cardiomyopathy    LBBB (left bundle branch block)    Myocardial infarction ([place]) dx'd ~ 2005   Nonischemic cardiomyopathy ([place])    s/p ICD in 2005. EF has since recovered   Obesity    On home oxygen therapy    "2L at night" (06/22/2018)   OSA on CPAP    Pneumonia    "couple times; last  time was 12/2016" (06/22/2018)   PONV (postoperative nausea and vomiting)    Presence of permanent cardiac pacemaker    [employer]   Swelling    Syncope    Systemic hypertension    Vitamin D deficiency    There were no vitals taken for this visit.  Opioid Risk Score:   Fall Risk Score:  `1  Depression screen [facility] 2/9     03/08/2022   10:19 AM 01/26/2022    9:54 AM 01/03/2022   11:24 AM 12/29/2021   10:23 AM 11/24/2021   11:24 AM 10/26/2021   10:22 AM 09/28/2021   10:20 AM  Depression screen PHQ 2/9  Decreased Interest 0 0 0 0 0 0 0  Down, Depressed, Hopeless 0 0 0 0 0 0 0  PHQ - 2 Score 0 0 0 0 0 0 0    Review of Systems  Musculoskeletal:  Positive for back pain and gait problem.       Pain in both knees, pain right buttock, pain in both hands & fingers  All other systems reviewed and are negative.      Objective:   Physical Exam Vitals and nursing note reviewed.  Constitutional:      Appearance: Normal appearance.  Cardiovascular:     Rate and Rhythm: Normal rate and regular rhythm.     Pulses: Normal pulses.     Heart sounds: Normal heart sounds.  Pulmonary:     Effort: Pulmonary effort is normal.     Breath sounds: Normal breath sounds.  Musculoskeletal:     Cervical back: Normal range of motion and neck supple.     Comments: Normal Muscle Bulk and Muscle Testing Reveals:  Upper Extremities: Decreased ROM  90 Degrees and Muscle Strength 5/5  Lumbar Paraspinal Tenderness: L-3-L-5 Lower Extremities: Full ROM and Muscle Strength 5/5 Arises from Table slowly using cane for support Narrow Based  Gait     Skin:    General: Skin is warm and dry.  Neurological:     Mental Status: She is alert and oriented to person, place, and time.  Psychiatric:        Mood and Affect: Mood normal.        Behavior: Behavior normal.         Assessment & Plan:  Chronic Low Back Pain: Encouraged to increase HEP as Tolerated. Continue to Monitor.  06/21/2022 Bilateral Primary  Osteoarthritis:  S/P Right Knee Genicular nerve radiofrequency neurotomy, with Dr Letta Pate. On 09/28/2021 and S/P Left Knee Genicular Nerve Radiofrequency on 10/05/2021. With good relief noted.  Scheduled for Right and Left Knee Genicular Nerve Radiofrequency Neurotomy: Indication Chronic post operative pain in the Knee, pain postop total knee replacement which has not responded to conservative management such as physical therapy and medication management; Continue  monitor. 06/21/2022 3. Myofascial Pain: S/P Trigger Point Injection on 06/29/2021 with Good Relief Noted. 06/21/2022 4. Chronic Pain Syndrome: Refilled: Oxycodone '5mg'$ /325 mg one tablet three times a day as needed for pain #90, Discontinue Hydrocodone due to itching, Ms. Bartnick verbalizes understanding.  We will continue the opioid monitoring program, this consists of regular clinic visits, examinations, urine drug screen, pill counts as well as use of [place] Controlled Substance Reporting system. A 12 month History has been reviewed on the [place] Controlled Substance Reporting System on 06/21/2022.   F/U in 1 month

## 2022-06-24 ENCOUNTER — Ambulatory Visit (INDEPENDENT_AMBULATORY_CARE_PROVIDER_SITE_OTHER): Payer: Medicare Other

## 2022-06-24 DIAGNOSIS — I428 Other cardiomyopathies: Secondary | ICD-10-CM

## 2022-06-25 ENCOUNTER — Encounter: Payer: Self-pay | Admitting: Registered Nurse

## 2022-06-25 LAB — CUP PACEART REMOTE DEVICE CHECK
Battery Remaining Longevity: 66 mo
Battery Remaining Percentage: 73 %
Brady Statistic RA Percent Paced: 0 %
Brady Statistic RV Percent Paced: 99 %
Date Time Interrogation Session: 20231201021400
HighPow Impedance: 77 Ohm
Implantable Lead Connection Status: 753985
Implantable Lead Connection Status: 753985
Implantable Lead Connection Status: 753985
Implantable Lead Implant Date: 20191129
Implantable Lead Implant Date: 20191129
Implantable Lead Implant Date: 20191129
Implantable Lead Location: 753858
Implantable Lead Location: 753859
Implantable Lead Location: 753860
Implantable Lead Model: 293
Implantable Lead Model: 4086
Implantable Lead Serial Number: 226691
Implantable Lead Serial Number: 440868
Implantable Pulse Generator Implant Date: 20191129
Lead Channel Impedance Value: 490 Ohm
Lead Channel Impedance Value: 599 Ohm
Lead Channel Impedance Value: 879 Ohm
Lead Channel Setting Pacing Amplitude: 2 V
Lead Channel Setting Pacing Amplitude: 2.5 V
Lead Channel Setting Pacing Amplitude: 3 V
Lead Channel Setting Pacing Pulse Width: 0.4 ms
Lead Channel Setting Pacing Pulse Width: 1 ms
Lead Channel Setting Sensing Sensitivity: 0.5 mV
Lead Channel Setting Sensing Sensitivity: 1 mV
Pulse Gen Serial Number: 227627

## 2022-07-12 NOTE — Progress Notes (Signed)
Remote ICD transmission.   

## 2022-07-19 ENCOUNTER — Encounter: Payer: Self-pay | Admitting: Registered Nurse

## 2022-07-19 ENCOUNTER — Encounter: Payer: Medicare Other | Attending: Registered Nurse | Admitting: Registered Nurse

## 2022-07-19 VITALS — BP 105/60 | HR 74 | Ht 64.0 in | Wt 245.0 lb

## 2022-07-19 DIAGNOSIS — M2550 Pain in unspecified joint: Secondary | ICD-10-CM | POA: Diagnosis not present

## 2022-07-19 DIAGNOSIS — G8929 Other chronic pain: Secondary | ICD-10-CM | POA: Diagnosis not present

## 2022-07-19 DIAGNOSIS — G894 Chronic pain syndrome: Secondary | ICD-10-CM | POA: Diagnosis not present

## 2022-07-19 DIAGNOSIS — Z79891 Long term (current) use of opiate analgesic: Secondary | ICD-10-CM | POA: Diagnosis not present

## 2022-07-19 DIAGNOSIS — Z5181 Encounter for therapeutic drug level monitoring: Secondary | ICD-10-CM

## 2022-07-19 DIAGNOSIS — M7918 Myalgia, other site: Secondary | ICD-10-CM | POA: Diagnosis not present

## 2022-07-19 DIAGNOSIS — M25551 Pain in right hip: Secondary | ICD-10-CM

## 2022-07-19 DIAGNOSIS — M545 Low back pain, unspecified: Secondary | ICD-10-CM | POA: Diagnosis not present

## 2022-07-19 DIAGNOSIS — M170 Bilateral primary osteoarthritis of knee: Secondary | ICD-10-CM | POA: Diagnosis not present

## 2022-07-19 MED ORDER — OXYCODONE-ACETAMINOPHEN 5-325 MG PO TABS: 1.0000 | ORAL_TABLET | Freq: Three times a day (TID) | ORAL | 0 refills | Status: DC | PRN

## 2022-07-19 NOTE — Progress Notes (Signed)
Subjective:    Patient ID: Crystal [name], female    DOB: 1951-03-16, 71 y.o.   MRN: [id number]  HPI: Crystal [name] is a 71 y.o. female who returns for follow up appointment for chronic pain and medication refill. She states her pain is located in her lower back, right hip pain and reports right ischial pain. Crystal [name] will speak with Dr Letta Pate regarding injection, she has a scheduled appointment with Dr Letta Pate.  She also reports bilateral knee pain and generalized joint pain. She rates her pain 7. Her current exercise regime is walking short distances with walker.   Crystal [name] Morphine equivalent is 22.50 MME.   Last UDS was Performed on 03/08/2022, it was consistent.    Pain Inventory Average Pain 9 Pain Right Now 7 My pain is intermittent, constant, sharp, dull, stabbing, and aching  In the last 24 hours, has pain interfered with the following? General activity 7 Relation with others 7 Enjoyment of life 7 What TIME of day is your pain at its worst? morning , evening, and night Sleep (in general) Good  Pain is worse with: walking, bending, standing, and some activites Pain improves with: rest, heat/ice, medication, and injections Relief from Meds: 8  Family History  Problem Relation Age of Onset   Hypertension Father    Heart disease Father    Cancer Father        prostate   Heart disease Other        (Maternal side) Ischemic heart disease   Diabetes Mellitus II Other    Arrhythmia Mother    Diabetes Mellitus II Mother        Borderline DM   Hypertension Mother    Asthma Mother    Cancer Brother    Dementia Brother    Hypertension Brother    Hypertension Daughter    Hypertension Daughter    Diabetes Daughter    Hypertension Daughter    Atrial fibrillation Daughter    GER disease Daughter    Hypertension Son    Anxiety disorder Son    Hypothyroidism Son    Breast cancer Maternal Grandmother        in her 86's   Social History   Socioeconomic History    Marital status: Widowed    Spouse name: Not on file   Number of children: Not on file   Years of education: Not on file   Highest education level: Not on file  Occupational History    Comment: retired LPN  Tobacco Use   Smoking status: Never   Smokeless tobacco: Never  Vaping Use   Vaping Use: Never used  Substance and Sexual Activity   Alcohol use: Not Currently   Drug use: Never   Sexual activity: Not Currently  Other Topics Concern   Not on file  Social History Narrative   Pt lives in [place].  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      As of 03/15/17:   Diet: 1800 Calorie      Caffeine: Yes      Married, if yes what year: Widowed, married 1973      Do you live in a house, apartment, assisted living, [place], trailer, ect: House, 1 stories, and 1 person      Pets: No      Current/Past profession: LPN, retired       Exercise: Yes, walking  Living Will: Yes   DNR: No   POA/HPOA: Yes      Functional Status:   Do you have difficulty bathing or dressing yourself? No   Do you have difficulty preparing food or eating? No   Do you have difficulty managing your medications? No   Do you have difficulty managing your finances? No   Do you have difficulty affording your medications? Yes   Social Determinants of Health   Financial Resource Strain: Low Risk  (10/11/2017)   Overall Financial Resource Strain (CARDIA)    Difficulty of Paying Living Expenses: Not hard at all  Food Insecurity: No Food Insecurity (01/03/2022)   Hunger Vital Sign    Worried About Running Out of Food in the Last Year: Never true    Ran Out of Food in the Last Year: Never true  Transportation Needs: No Transportation Needs (01/03/2022)   PRAPARE - Hydrologist (Medical): No    Lack of Transportation (Non-Medical): No  Physical Activity: Inactive (10/11/2017)   Exercise Vital Sign    Days of Exercise  per Week: 0 days    Minutes of Exercise per Session: 0 min  Stress: Stress Concern Present (10/11/2017)   [place]    Feeling of Stress : To some extent  Social Connections: Somewhat Isolated (10/11/2017)   Social Connection and Isolation Panel [NHANES]    Frequency of Communication with Friends and Family: More than three times a week    Frequency of Social Gatherings with Friends and Family: More than three times a week    Attends Religious Services: More than 4 times per year    Active Member of [employer] or Organizations: No    Attends Archivist Meetings: Never    Marital Status: Widowed   Past Surgical History:  Procedure Laterality Date   APPENDECTOMY  07/13/2017   BLADDER SUSPENSION  1990   "w/hysterectomy"   BOWEL RESECTION N/A 07/09/2018   Procedure: SMALL BOWEL RESECTION;  Surgeon: Judeth Horn, MD;  Location: [facility];  Service: General;  Laterality: N/A;   CARDIAC CATHETERIZATION  2005   no obstructive CAD per patient   CARDIAC CATHETERIZATION  2018   CARDIAC DEFIBRILLATOR PLACEMENT  2005   BiV ICD implanted,  LV lead is an epicardial lead   CARPAL TUNNEL RELEASE Left 07/25/2014   Dr.Williamson    CARPAL TUNNEL RELEASE Right 04/08/2019   Procedure: RIGHT CARPAL TUNNEL RELEASE ENDOSCOPIC;  Surgeon: Milly Jakob, MD;  Location: [facility];  Service: Orthopedics;  Laterality: Right;   CATARACT EXTRACTION W/ INTRAOCULAR LENS  IMPLANT, BILATERAL Bilateral    COLONIC STENT PLACEMENT N/A 08/31/2017   Procedure: COLONIC STENT PLACEMENT;  Surgeon: Carol Ada, MD;  Location: [facility];  Service: Endoscopy;  Laterality: N/A;   COLONOSCOPY     2010-2011 Dr.Kipreos    COLOSTOMY  12/2016   Archie Endo 01/26/2017   COLOSTOMY REVERSAL N/A 07/13/2017   Procedure: COLOSTOMY REVERSAL;  Surgeon: Judeth Horn, MD;  Location: [facility];  Service: General;  Laterality: N/A;   CYST REMOVAL HAND Right 05/2013   thumb   DILATION  AND CURETTAGE OF UTERUS  X 5-6   ESOPHAGOGASTRODUODENOSCOPY (EGD) WITH PROPOFOL N/A 03/13/2020   Procedure: ESOPHAGOGASTRODUODENOSCOPY (EGD) WITH PROPOFOL;  Surgeon: Carol Ada, MD;  [facility];  Service: Endoscopy;  Laterality: N/A;   EXCISION MASS ABDOMINAL N/A 07/09/2018   Procedure: EXPLORATION OF  ABDOMINAL WOUND ERAS PATHWAY;  Surgeon: Judeth Horn,  MD;  Location: [place];  Service: General;  Laterality: N/A;   FLEXIBLE SIGMOIDOSCOPY N/A 08/24/2017   Procedure: Beryle Quant;  Surgeon: Carol Ada, MD;  Location: [place] [facility];  Service: Endoscopy;  Laterality: N/A;   FLEXIBLE SIGMOIDOSCOPY N/A 08/31/2017   Procedure: FLEXIBLE SIGMOIDOSCOPY;  Surgeon: Carol Ada, MD;  Location: [place];  Service: Endoscopy;  Laterality: N/A;  stent placement   FLEXIBLE SIGMOIDOSCOPY N/A 03/13/2020   Procedure: FLEXIBLE SIGMOIDOSCOPY;  Surgeon: Carol Ada, MD;  [facility];  Service: Endoscopy;  Laterality: N/A;   FLEXIBLE SIGMOIDOSCOPY N/A 12/18/2020   Procedure: FLEXIBLE SIGMOIDOSCOPY;  Surgeon: Carol Ada, MD;  [facility];  Service: Endoscopy;  Laterality: N/A;   HEMOSTASIS CLIP PLACEMENT  03/13/2020   Procedure: HEMOSTASIS CLIP PLACEMENT;  Surgeon: Carol Ada, MD;  [facility];  Service: Endoscopy;;   HEMOSTASIS CLIP PLACEMENT  12/18/2020   Procedure: HEMOSTASIS CLIP PLACEMENT;  Surgeon: Carol Ada, MD;  [facility];  Service: Endoscopy;;   HOT HEMOSTASIS N/A 03/13/2020   Procedure: HOT HEMOSTASIS (ARGON PLASMA COAGULATION/BICAP);  Surgeon: Carol Ada, MD;  Location[facility];  Service: Endoscopy;  Laterality: N/A;   HOT HEMOSTASIS N/A 12/18/2020   Procedure: HOT HEMOSTASIS (ARGON PLASMA COAGULATION/BICAP);  Surgeon: Carol Ada, MD;  Location[facility];  Service: Endoscopy;  Laterality: N/A;   IMPLANTABLE CARDIOVERTER DEFIBRILLATOR GENERATOR CHANGE  2008   INSERTION OF MESH N/A 07/09/2018   Procedure: INSERTION  OF VICRYL MESH;  Surgeon: Judeth Horn, MD;  Location: [facility];  Service: General;  Laterality: N/A;   LAPAROTOMY N/A 09/18/2017   Procedure: EXPLORATORY LAPAROTOMY;  Surgeon: Judeth Horn, MD;  Location: [facility];  Service: General;  Laterality: N/A;   LEAD REVISION  06/22/2018   LEAD REVISION/REPAIR N/A 06/22/2018   Procedure: LEAD REVISION/REPAIR;  Surgeon: Deboraha Sprang, MD;  Location: [facility];  Service: Cardiovascular;  Laterality: N/A;   LYSIS OF ADHESION N/A 09/18/2017   Procedure: LYSIS OF ADHESION;  Surgeon: Judeth Horn, MD;  Location: [facility];  Service: General;  Laterality: N/A;   LYSIS OF ADHESION N/A 07/09/2018   Procedure: LYSIS OF ADHESION;  Surgeon: Judeth Horn, MD;  Location: [facility];  Service: General;  Laterality: N/A;   PARTIAL COLECTOMY N/A 09/18/2017   Procedure: ILEOCOLECTOMY;  Surgeon: Judeth Horn, MD;  Location: [facility];  Service: General;  Laterality: N/A;   PARTIAL COLECTOMY  09/25/2017   POLYPECTOMY  03/13/2020   Procedure: POLYPECTOMY;  Surgeon: Carol Ada, MD;  [facility];  Service: Endoscopy;;   RIGHT/LEFT HEART CATH AND CORONARY ANGIOGRAPHY N/A 06/01/2018   Procedure: RIGHT/LEFT HEART CATH AND CORONARY ANGIOGRAPHY;  Surgeon: Jettie Booze, MD;  Location: [facility];  Service: Cardiovascular;  Laterality: N/A;   SIGMOIDOSCOPY N/A 09/18/2017   Procedure: SIGMOIDOSCOPY;  Surgeon: Judeth Horn, MD;  Location: [facility];  Service: General;  Laterality: N/A;   SMALL INTESTINE SURGERY  07/09/2018   EXPLORATION OF  ABDOMINAL WOUND ERAS PATHWAYN/; MESH; LYSIS OF ADHESIONS   TOTAL ABDOMINAL HYSTERECTOMY  1990   TRANSFORAMINAL LUMBAR INTERBODY FUSION (TLIF) WITH PEDICLE SCREW FIXATION 2 LEVEL N/A 01/07/2020   Procedure: Lumbar Four-Five and Lumbar Five-Sacral One open lumbar decompression and transforaminal lumbar interbody fusion;  Surgeon: Judith Part, MD;  Location: [facility];  Service: Neurosurgery;  Laterality: N/A;  Lumbar Four-Five and  Lumbar Five-Sacral One open lumbar decompression and transforaminal lumbar interbody fusion   TRIGGER FINGER RELEASE Right 05/213   TRIGGER FINGER RELEASE Right 04/08/2019  Procedure: RIGHT INDEX RELEASE TRIGGER FINGER/A-1 PULLEY;  Surgeon: Milly Jakob, MD;  Location: [facility];  Service: Orthopedics;  Laterality: Right;   TUBAL LIGATION  1980s   VESICOVAGINAL FISTULA CLOSURE W/ TAH     Past Surgical History:  Procedure Laterality Date   APPENDECTOMY  07/13/2017   BLADDER SUSPENSION  1990   "w/hysterectomy"   BOWEL RESECTION N/A 07/09/2018   Procedure: SMALL BOWEL RESECTION;  Surgeon: Judeth Horn, MD;  Location: [facility];  Service: General;  Laterality: N/A;   CARDIAC CATHETERIZATION  2005   no obstructive CAD per patient   CARDIAC CATHETERIZATION  2018   CARDIAC DEFIBRILLATOR PLACEMENT  2005   BiV ICD implanted,  LV lead is an epicardial lead   CARPAL TUNNEL RELEASE Left 07/25/2014   Dr.Williamson    CARPAL TUNNEL RELEASE Right 04/08/2019   Procedure: RIGHT CARPAL TUNNEL RELEASE ENDOSCOPIC;  Surgeon: Milly Jakob, MD;  Location: [facility];  Service: Orthopedics;  Laterality: Right;   CATARACT EXTRACTION W/ INTRAOCULAR LENS  IMPLANT, BILATERAL Bilateral    COLONIC STENT PLACEMENT N/A 08/31/2017   Procedure: COLONIC STENT PLACEMENT;  Surgeon: Carol Ada, MD;  Location: [place];  Service: Endoscopy;  Laterality: N/A;   COLONOSCOPY     2010-2011 Dr.Kipreos    COLOSTOMY  12/2016   Archie Endo 01/26/2017   COLOSTOMY REVERSAL N/A 07/13/2017   Procedure: COLOSTOMY REVERSAL;  Surgeon: Judeth Horn, MD;  Location: [facility];  Service: General;  Laterality: N/A;   CYST REMOVAL HAND Right 05/2013   thumb   DILATION AND CURETTAGE OF UTERUS  X 5-6   ESOPHAGOGASTRODUODENOSCOPY (EGD) WITH PROPOFOL N/A 03/13/2020   Procedure: ESOPHAGOGASTRODUODENOSCOPY (EGD) WITH PROPOFOL;  Surgeon: Carol Ada, MD;  [facility];  Service: Endoscopy;  Laterality: N/A;   EXCISION MASS ABDOMINAL N/A 07/09/2018    Procedure: EXPLORATION OF  ABDOMINAL WOUND ERAS PATHWAY;  Surgeon: Judeth Horn, MD;  Location: [facility];  Service: General;  Laterality: N/A;   FLEXIBLE SIGMOIDOSCOPY N/A 08/24/2017   Procedure: Beryle Quant;  Surgeon: Carol Ada, MD;  Location: [place];  Service: Endoscopy;  Laterality: N/A;   FLEXIBLE SIGMOIDOSCOPY N/A 08/31/2017   Procedure: FLEXIBLE SIGMOIDOSCOPY;  Surgeon: Carol Ada, MD;  Location: [place] [facility];  Service: Endoscopy;  Laterality: N/A;  stent placement   FLEXIBLE SIGMOIDOSCOPY N/A 03/13/2020   Procedure: FLEXIBLE SIGMOIDOSCOPY;  Surgeon: Carol Ada, MD;  [facility];  Service: Endoscopy;  Laterality: N/A;   FLEXIBLE SIGMOIDOSCOPY N/A 12/18/2020   Procedure: FLEXIBLE SIGMOIDOSCOPY;  Surgeon: Carol Ada, MD;  [facility];  Service: Endoscopy;  Laterality: N/A;   HEMOSTASIS CLIP PLACEMENT  03/13/2020   Procedure: HEMOSTASIS CLIP PLACEMENT;  Surgeon: Carol Ada, MD;  [facility];  Service: Endoscopy;;   HEMOSTASIS CLIP PLACEMENT  12/18/2020   Procedure: HEMOSTASIS CLIP PLACEMENT;  Surgeon: Carol Ada, MD;  [facility];  Service: Endoscopy;;   HOT HEMOSTASIS N/A 03/13/2020   Procedure: HOT HEMOSTASIS (ARGON PLASMA COAGULATION/BICAP);  Surgeon: Carol Ada, MD;  [facility];  Service: Endoscopy;  Laterality: N/A;   HOT HEMOSTASIS N/A 12/18/2020   Procedure: HOT HEMOSTASIS (ARGON PLASMA COAGULATION/BICAP);  Surgeon: Carol Ada, MD;  Location[facility];  Service: Endoscopy;  Laterality: N/A;   IMPLANTABLE CARDIOVERTER DEFIBRILLATOR GENERATOR CHANGE  2008   INSERTION OF MESH N/A 07/09/2018   Procedure: INSERTION OF VICRYL MESH;  Surgeon: Judeth Horn, MD;  Location: [place];  Service: General;  Laterality: N/A;   LAPAROTOMY N/A 09/18/2017   Procedure: EXPLORATORY LAPAROTOMY;  Surgeon: Hulen Skains,  Jeneen Rinks, MD;  Location: [facility];  Service: General;  Laterality: N/A;   LEAD REVISION  06/22/2018   LEAD  REVISION/REPAIR N/A 06/22/2018   Procedure: LEAD REVISION/REPAIR;  Surgeon: Deboraha Sprang, MD;  Location: [facility];  Service: Cardiovascular;  Laterality: N/A;   LYSIS OF ADHESION N/A 09/18/2017   Procedure: LYSIS OF ADHESION;  Surgeon: Judeth Horn, MD;  Location: [place] [place];  Service: General;  Laterality: N/A;   LYSIS OF ADHESION N/A 07/09/2018   Procedure: LYSIS OF ADHESION;  Surgeon: Judeth Horn, MD;  Location: [place];  Service: General;  Laterality: N/A;   PARTIAL COLECTOMY N/A 09/18/2017   Procedure: ILEOCOLECTOMY;  Surgeon: Judeth Horn, MD;  Location: [facility];  Service: General;  Laterality: N/A;   PARTIAL COLECTOMY  09/25/2017   POLYPECTOMY  03/13/2020   Procedure: POLYPECTOMY;  Surgeon: Carol Ada, MD;  [facility];  Service: Endoscopy;;   RIGHT/LEFT HEART CATH AND CORONARY ANGIOGRAPHY N/A 06/01/2018   Procedure: RIGHT/LEFT HEART CATH AND CORONARY ANGIOGRAPHY;  Surgeon: Jettie Booze, MD;  Location: [facility];  Service: Cardiovascular;  Laterality: N/A;   SIGMOIDOSCOPY N/A 09/18/2017   Procedure: SIGMOIDOSCOPY;  Surgeon: Judeth Horn, MD;  Location: [place];  Service: General;  Laterality: N/A;   SMALL INTESTINE SURGERY  07/09/2018   EXPLORATION OF  ABDOMINAL WOUND ERAS PATHWAYN/; MESH; LYSIS OF ADHESIONS   TOTAL ABDOMINAL HYSTERECTOMY  1990   TRANSFORAMINAL LUMBAR INTERBODY FUSION (TLIF) WITH PEDICLE SCREW FIXATION 2 LEVEL N/A 01/07/2020   Procedure: Lumbar Four-Five and Lumbar Five-Sacral One open lumbar decompression and transforaminal lumbar interbody fusion;  Surgeon: Judith Part, MD;  Location: [facility];  Service: Neurosurgery;  Laterality: N/A;  Lumbar Four-Five and Lumbar Five-Sacral One open lumbar decompression and transforaminal lumbar interbody fusion   TRIGGER FINGER RELEASE Right 05/213   TRIGGER FINGER RELEASE Right 04/08/2019   Procedure: RIGHT INDEX RELEASE TRIGGER FINGER/A-1 PULLEY;  Surgeon: Milly Jakob, MD;  Location: [facility];   Service: Orthopedics;  Laterality: Right;   TUBAL LIGATION  1980s   VESICOVAGINAL FISTULA CLOSURE W/ TAH     Past Medical History:  Diagnosis Date   AICD (automatic cardioverter/defibrillator) present    high RV threshold chronically, device was turned off in 2014; Device battery has been dead x 7 years; "turned it back on 06/22/2018"   Anemia, iron deficiency    Angioedema    felt to likely be due to ace inhibitors but says she has had this even off of medicines, appears to be tolerating ARBs chronically   Anxiety    Arthritis    "hands, legs, arms; bad in my back" (06/22/2018)   Asthmatic bronchitis with status asthmaticus    Bradycardia    CHF (congestive heart failure) (HCC)    Chronic lower back pain    Chronic pain    CKD (chronic kidney disease), stage III (HCC)    Coronary artery disease    Mild nonobstructive (30% LAD, 30% RCA) by 09/01/16 cath at [facility]   Degenerative joint disease    Depression    Diabetes mellitus, type 2 ([place])    Diabetic peripheral neuropathy ([place])    Dizziness    Dyspnea on exertion    Family history of adverse reaction to anesthesia    Mother has nausea   GERD (gastroesophageal reflux disease)    Gout    Heart valve disorder    History of blood transfusion 1981; ~ 2005; 12/2016   "childbirth; defibrillator OR; colostomy  OR"   History of mononucleosis 03/2014   Hyperlipidemia    Hypertension    Hypothyroid    Insomnia    Intervertebral disc degeneration    Ischemic cardiomyopathy    LBBB (left bundle branch block)    Myocardial infarction ([place]) dx'd ~ 2005   Nonischemic cardiomyopathy ([place])    s/p ICD in 2005. EF has since recovered   Obesity    On home oxygen therapy    "2L at night" (06/22/2018)   OSA on CPAP    Pneumonia    "couple times; last time was 12/2016" (06/22/2018)   PONV (postoperative nausea and vomiting)    Presence of permanent cardiac pacemaker    [employer]   Swelling    Syncope    Systemic  hypertension    Vitamin D deficiency    There were no vitals taken for this visit.  Opioid Risk Score:   Fall Risk Score:  `1  Depression screen [facility] 2/9     06/21/2022   10:01 AM 03/08/2022   10:19 AM 01/26/2022    9:54 AM 01/03/2022   11:24 AM 12/29/2021   10:23 AM 11/24/2021   11:24 AM 10/26/2021   10:22 AM  Depression screen [facility] 2/9  Decreased Interest 0 0 0 0 0 0 0  Down, Depressed, Hopeless 0 0 0 0 0 0 0  PHQ - 2 Score 0 0 0 0 0 0 0      Review of Systems  Musculoskeletal:  Positive for gait problem.       Pain in both knees Pain in both wrist Right hip       Objective:   Physical Exam Vitals and nursing note reviewed.  Constitutional:      Appearance: Normal appearance.  Cardiovascular:     Rate and Rhythm: Normal rate and regular rhythm.     Pulses: Normal pulses.     Heart sounds: Normal heart sounds.  Pulmonary:     Effort: Pulmonary effort is normal.     Breath sounds: Normal breath sounds.  Musculoskeletal:     Cervical back: Normal range of motion and neck supple.     Comments: Normal Muscle Bulk and Muscle Testing Reveals:  Upper Extremities: Full ROM and Muscle Strength 5/5  Lumbar Paraspinal Tenderness: L-4-L-5 Lower Extremities: Full ROM and Muscle Strength 5/5 Arises from Table slowly using walker for support Antalgic  Gait     Skin:    General: Skin is warm and dry.  Neurological:     Mental Status: She is alert and oriented to person, place, and time.  Psychiatric:        Mood and Affect: Mood normal.        Behavior: Behavior normal.         Assessment & Plan:  Chronic Low Back Pain: Encouraged to increase HEP as Tolerated. Continue to Monitor.  07/19/2022 2. Bilateral Primary Osteoarthritis:  S/P Right Knee Genicular nerve radiofrequency neurotomy, with Dr Letta Pate. On 09/28/2021 and S/P Left Knee Genicular Nerve Radiofrequency on 10/05/2021. With good relief noted.  Scheduled for Right and Left Knee Genicular Nerve Radiofrequency  Neurotomy: Indication Chronic post operative pain in the Knee, pain postop total knee replacement which has not responded to conservative management such as physical therapy and medication management; Continue monitor. 07/19/2022 3. Myofascial Pain: S/P Trigger Point Injection on 06/29/2021 with Good Relief Noted. 07/19/2022 4. Chronic Pain Syndrome: Refilled: Oxycodone '5mg'$ /325 mg one tablet three times a day as needed for pain #  90, Discontinue Hydrocodone due to itching, Crystal [name] verbalizes understanding.  We will continue the opioid monitoring program, this consists of regular clinic visits, examinations, urine drug screen, pill counts as well as use of [place] Controlled Substance Reporting system. A 12 month History has been reviewed on the [place] Controlled Substance Reporting System on 07/19/2022.   F/U in 1 month

## 2022-07-20 ENCOUNTER — Ambulatory Visit: Payer: Medicare Other | Admitting: Registered Nurse

## 2022-07-25 ENCOUNTER — Encounter: Payer: Self-pay | Admitting: Registered Nurse

## 2022-07-28 DIAGNOSIS — N184 Chronic kidney disease, stage 4 (severe): Secondary | ICD-10-CM | POA: Diagnosis not present

## 2022-08-01 ENCOUNTER — Ambulatory Visit: Payer: Self-pay

## 2022-08-01 NOTE — Patient Instructions (Signed)
Visit Information  Thank you for taking time to visit with me today. Please don't hesitate to contact me if I can be of assistance to you.   Following are the goals we discussed today:   Goals Addressed               This Visit's Progress     Patient Stated     I would like to continue to improve my diabetes (pt-stated)        Care Coordination Interventions:  Assessed patient's understanding of A1c goal: <7% Lab Results  Component Value Date   HGBA1C 8.3(H) 05/09/2022  Determined patient continues to average FBS in 100's, she is averaging as high as 300's in late afternoon Educated patient on target daily glycemic goal, FBS 80-130, <180 within 2 hours after meals Educated patient on basic disease process and potential for complications for hyperglycemia and elevated A1c >7.0 % Instructed patient to record her blood sugars on daily log with time of day, result and meal eaten to provide to diabetes educator at next scheduled visit Reviewed upcoming appointment with Etta Grandchild, diabetes educator with [facility] scheduled for 08/10/22        Other     continue to get good pain control        Care Coordination Interventions: Reviewed provider established plan for pain management Discussed importance of adherence to all scheduled medical appointments Counseled on the importance of reporting any/all new or changed pain symptoms or management strategies to pain management provider Advised patient to report to care team affect of pain on daily activities Educated patient regarding the benefits of PT to help manage pain, improve ROM and build stamina and endurance Advised patient to discuss outpatient PT with provider Reviewed upcoming appointments scheduled with Dr. Letta Pate for bilateral knee nerve block, confirmed patient has transportation for all medical appointments Encouraged patient to continue to use her walker with ambulation as directed to help reduce risk for  falls            Our next appointment is by telephone on 09/16/22 at 10:30 AM  Please call the care guide team at [phone] if you need to cancel or reschedule your appointment.   If you are experiencing a Mental Health or [place] or need someone to talk to, please go to [facility] [address] [facility] [phone])  Patient verbalizes understanding of instructions and care plan provided today and agrees to view in [place]. Active MyChart status and patient understanding of how to access instructions and care plan via MyChart confirmed with patient.     Barb Merino, RN, BSN, CCM Care Management Coordinator [facility] Care Management Direct Phone: [phone]

## 2022-08-01 NOTE — Patient Outreach (Signed)
  Care Coordination   Follow Up Visit Note   08/01/2022 Name: Crystal [name] MRN: [id number] DOB: [birth year]  Crystal [name] is a 72 y.o. year old female who sees Crist Infante, MD for primary care. I spoke with  Crystal [name] by phone today.  What matters to the patients health and wellness today?  Patient will record her blood sugars to share with her diabetes educator at upcoming appointment. Patient will discuss outpatient PT with Dr. Letta Pate.     Goals Addressed               This Visit's Progress     Patient Stated     I would like to continue to improve my diabetes (pt-stated)        Care Coordination Interventions:  Assessed patient's understanding of A1c goal: <7% Lab Results  Component Value Date   HGBA1C 8.3(H) 05/09/2022  Determined patient continues to average FBS in 100's, she is averaging as high as 300's in late afternoon Educated patient on target daily glycemic goal, FBS 80-130, <180 within 2 hours after meals Educated patient on basic disease process and potential for complications for hyperglycemia and elevated A1c >7.0 % Instructed patient to record her blood sugars on daily log with time of day, result and meal eaten to provide to diabetes educator at next scheduled visit Reviewed upcoming appointment with Etta Grandchild, diabetes educator with [facility] scheduled for 08/10/22        Other     continue to get good pain control        Care Coordination Interventions: Reviewed provider established plan for pain management Discussed importance of adherence to all scheduled medical appointments Counseled on the importance of reporting any/all new or changed pain symptoms or management strategies to pain management provider Advised patient to report to care team affect of pain on daily activities Educated patient regarding the benefits of PT to help manage pain, improve ROM and build stamina and endurance Advised patient to discuss outpatient PT  with provider Reviewed upcoming appointments scheduled with Dr. Letta Pate for bilateral knee nerve block, confirmed patient has transportation for all medical appointments Encouraged patient to continue to use her walker with ambulation as directed to help reduce risk for falls            SDOH assessments and interventions completed:  No     Care Coordination Interventions:  Yes, provided   Follow up plan: Follow up call scheduled for 09/16/22 '@10'$ :30 AM    Encounter Outcome:  Pt. Visit Completed

## 2022-08-02 ENCOUNTER — Encounter: Payer: Medicare Other | Attending: Registered Nurse | Admitting: Physical Medicine & Rehabilitation

## 2022-08-02 ENCOUNTER — Encounter: Payer: Self-pay | Admitting: Physical Medicine & Rehabilitation

## 2022-08-02 VITALS — BP 127/70 | HR 73 | Temp 98.2°F | Ht 64.0 in | Wt 245.0 lb

## 2022-08-02 DIAGNOSIS — M170 Bilateral primary osteoarthritis of knee: Secondary | ICD-10-CM

## 2022-08-02 DIAGNOSIS — G5702 Lesion of sciatic nerve, left lower limb: Secondary | ICD-10-CM | POA: Diagnosis not present

## 2022-08-02 MED ORDER — LIDOCAINE HCL (PF) 2 % IJ SOLN
3.0000 mL | Freq: Once | INTRAMUSCULAR | Status: AC
  Administered 2022-08-02: 3 mL

## 2022-08-02 MED ORDER — LIDOCAINE HCL 1 % IJ SOLN
5.0000 mL | Freq: Once | INTRAMUSCULAR | Status: AC
  Administered 2022-08-02: 5 mL

## 2022-08-02 MED ORDER — BUPIVACAINE HCL 0.5 % IJ SOLN
5.0000 mL | Freq: Once | INTRAMUSCULAR | Status: AC
  Administered 2022-08-02: 5 mL

## 2022-08-02 NOTE — Progress Notes (Signed)
  PROCEDURE RECORD  Physical Medicine and Rehabilitation   Name: Crystal [name] DOB:18-Apr-1951 MRN: [id number]  Date:08/02/2022  Physician: Alysia Penna, MD    Nurse/CMA: Brent Noto RMA   Allergies:  Allergies  Allergen Reactions   Ace Inhibitors Swelling and Other (See Comments)    Angioedema Other reaction(s): swelling Other reaction(s): swelling Other reaction(s): swelling Other reaction(s): Unknown   Glucophage [Metformin Hcl] Other (See Comments)    Renal failure   Telmisartan Other (See Comments)    AVOID ARB/ ACEi in this patient due to recurrent AKI   Advicor [Niacin-Lovastatin Er] Other (See Comments)    Muscle aches   Bystolic [Nebivolol Hcl] Swelling    Whole body swelled   Erythromycin Diarrhea, Nausea And Vomiting and Other (See Comments)    *DERIVATIVES*  Other reaction(s): severe vomiting  Other reaction(s): Unknown   Lipitor [Atorvastatin] Other (See Comments)    Muscle aches   Lopid [Gemfibrozil] Other (See Comments)    Muscle aches   Statins Other (See Comments)    Muscle aches tolerates crestor Other reaction(s): Unknown Other reaction(s): muscle aches   Hydromorphone Other (See Comments)    Confusion Other reaction(s): confusion.can take lower level narcs but those cause a lot of itching Other reaction(s): confusion.can take lower level narcs but those cause a lot of itching   Lisinopril    Losartan     Other reaction(s): stopped 12/2018. ? due to worse gfr Other reaction(s): stopped 12/2018. ? due to worse gfr Other reaction(s): stopped 12/2018. ? due to worse gfr Other reaction(s): stopped 12/2018. ? due to worse gfr   Nebivolol Hcl Swelling    Other reaction(s): swelling Other reaction(s): swelling Other reaction(s): swelling Other reaction(s): Unknown   Other     Other reaction(s): muscle aches   Sacubitril-Valsartan     Other reaction(s): itching Other reaction(s): itching Other reaction(s): itching Other reaction(s): itching    Spironolactone     Other reaction(s): arf Other reaction(s): arf Other reaction(s): arf Other reaction(s): arf    Consent Signed: Yes.    Is patient diabetic? Yes.    CBG today? 151  Pregnant: No. LMP: No LMP recorded. Patient has had a hysterectomy. (age 8-55)  Anticoagulants: no Anti-inflammatory: no Antibiotics: no  Procedure: Right Knee Genicular Nerve Radiofrequency Neurotomy  Position: Supine Start Time: 11:45am  End Time: 12:06  Fluoro Time: 36  RN/CMA Toyna Erisman RMA  Zyron Deeley RMA    Time 11:23AM 12:14PM    BP 127/70 123 63    Pulse 73 77    Respirations 16 16    O2 Sat 94 94    S/S 6 6    Pain Level 6/10 0/10     D/C home with Self, patient A & O X 3, D/C instructions reviewed, and sits independently.

## 2022-08-02 NOTE — Patient Instructions (Signed)
You had a radio frequency procedure today This was done to alleviate joint pain in your knee area We injected lidocaine which is a local anesthetic.  You may experience soreness at the injection sites. You may also experienced some irritation of the nerves that were heated I'm recommending ice for 30 minutes every 2 hours as needed for the next 24-48 hours

## 2022-08-02 NOTE — Progress Notes (Signed)
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 Patient placed supine on the fluoroscopy table AP images of the knee joint were obtained. A 25-gauge 1.5 inch needle was used to anesthetize the skin and subcutaneous tissue with 1% lidocaine, 1 cc at each of 3 locations. Then a 18-gauge 100 cm radiofrequency needle with 1 cm curved tip was inserted targeting the junction of the medial flare of the tibia with the shaft of the tibia, bone contact made and confirmed with lateral imaging.  Electrical stimulation up to 2 mV without abnormal twitch followed by injection of 1.34m .5% bupivacaine.  Radiofrequency lesioning at 80 C x 80 seconds was performed.  Then the junction of the medial epicondyles of the femur with the femoral shaft was targeted needle was advanced under fluoroscopic guidance until bone contact. Appropriate depth was obtained and confirmed with lateral images.  Motor stimulation up to 2 mV demonstrated no abnormal twitch followed by injection of of 1.529mof .5% bupivacaine.  Radiofrequency lesioning 80 C x 80 seconds was performed.  Then the junction of the lateral femoral condyle with the femoral shaft was targeted.  18-gauge 100 cm radiofrequency needle with 1 cm curved tipwas advanced under fluoroscopic guidance until bone contact. Appropriate depth was confirmed with lateral imaging. Motor stimulation up to 2 mV demonstrated no abnormal twitch followed by injection of of 1.25m20mf .5% bupivacaine.  Radiofrequency lesioning at 80 C x 80  seconds was performed. Patient tolerated procedure well. Post procedure instructions given

## 2022-08-03 DIAGNOSIS — I5042 Chronic combined systolic (congestive) and diastolic (congestive) heart failure: Secondary | ICD-10-CM | POA: Diagnosis not present

## 2022-08-03 DIAGNOSIS — I129 Hypertensive chronic kidney disease with stage 1 through stage 4 chronic kidney disease, or unspecified chronic kidney disease: Secondary | ICD-10-CM | POA: Diagnosis not present

## 2022-08-03 DIAGNOSIS — E1122 Type 2 diabetes mellitus with diabetic chronic kidney disease: Secondary | ICD-10-CM | POA: Diagnosis not present

## 2022-08-03 DIAGNOSIS — D509 Iron deficiency anemia, unspecified: Secondary | ICD-10-CM | POA: Diagnosis not present

## 2022-08-03 DIAGNOSIS — N184 Chronic kidney disease, stage 4 (severe): Secondary | ICD-10-CM | POA: Diagnosis not present

## 2022-08-03 DIAGNOSIS — N2581 Secondary hyperparathyroidism of renal origin: Secondary | ICD-10-CM | POA: Diagnosis not present

## 2022-08-08 NOTE — Therapy (Unsigned)
OUTPATIENT PHYSICAL THERAPY LOWER EXTREMITY EVALUATION   Patient Name: Crystal [name] MRN: [id number] DOB:12-15-1950, 72 y.o., female Today's Date: 08/09/2022  END OF SESSION:  PT End of Session - 08/09/22 1144     Visit Number 1    Number of Visits 16    Date for PT Re-Evaluation 10/04/22    Authorization Type Medicare    PT Start Time 1145    PT Stop Time 1230    PT Time Calculation (min) 45 min    Activity Tolerance Patient limited by pain    Behavior During Therapy [facility] for tasks assessed/performed             Past Medical History:  Diagnosis Date   AICD (automatic cardioverter/defibrillator) present    high RV threshold chronically, device was turned off in 2014; Device battery has been dead x 7 years; "turned it back on 06/22/2018"   Anemia, iron deficiency    Angioedema    felt to likely be due to ace inhibitors but says she has had this even off of medicines, appears to be tolerating ARBs chronically   Anxiety    Arthritis    "hands, legs, arms; bad in my back" (06/22/2018)   Asthmatic bronchitis with status asthmaticus    Bradycardia    CHF (congestive heart failure) (HCC)    Chronic lower back pain    Chronic pain    CKD (chronic kidney disease), stage III (HCC)    Coronary artery disease    Mild nonobstructive (30% LAD, 30% RCA) by 09/01/16 cath at [facility]   Degenerative joint disease    Depression    Diabetes mellitus, type 2 ([place])    Diabetic peripheral neuropathy ([place])    Dizziness    Dyspnea on exertion    Family history of adverse reaction to anesthesia    Mother has nausea   GERD (gastroesophageal reflux disease)    Gout    Heart valve disorder    History of blood transfusion 1981; ~ 2005; 12/2016   "childbirth; defibrillator OR; colostomy OR"   History of mononucleosis 03/2014   Hyperlipidemia    Hypertension    Hypothyroid    Insomnia    Intervertebral disc degeneration    Ischemic cardiomyopathy    LBBB (left bundle branch  block)    Myocardial infarction ([place]) dx'd ~ 2005   Nonischemic cardiomyopathy ([place])    s/p ICD in 2005. EF has since recovered   Obesity    On home oxygen therapy    "2L at night" (06/22/2018)   OSA on CPAP    Pneumonia    "couple times; last time was 12/2016" (06/22/2018)   PONV (postoperative nausea and vomiting)    Presence of permanent cardiac pacemaker    [employer]   Swelling    Syncope    Systemic hypertension    Vitamin D deficiency    Past Surgical History:  Procedure Laterality Date   APPENDECTOMY  07/13/2017   BLADDER SUSPENSION  1990   "w/hysterectomy"   BOWEL RESECTION N/A 07/09/2018   Procedure: SMALL BOWEL RESECTION;  Surgeon: Judeth Horn, MD;  Location: [place];  Service: General;  Laterality: N/A;   CARDIAC CATHETERIZATION  2005   no obstructive CAD per patient   CARDIAC CATHETERIZATION  2018   CARDIAC DEFIBRILLATOR PLACEMENT  2005   BiV ICD implanted,  LV lead is an epicardial lead   CARPAL TUNNEL RELEASE Left 07/25/2014   Dr.Williamson    CARPAL TUNNEL RELEASE Right  04/08/2019   Procedure: RIGHT CARPAL TUNNEL RELEASE ENDOSCOPIC;  Surgeon: Milly Jakob, MD;  Location: [place];  Service: Orthopedics;  Laterality: Right;   CATARACT EXTRACTION W/ INTRAOCULAR LENS  IMPLANT, BILATERAL Bilateral    COLONIC STENT PLACEMENT N/A 08/31/2017   Procedure: COLONIC STENT PLACEMENT;  Surgeon: Carol Ada, MD;  Location: [place];  Service: Endoscopy;  Laterality: N/A;   COLONOSCOPY     2010-2011 Dr.Kipreos    COLOSTOMY  12/2016   Archie Endo 01/26/2017   COLOSTOMY REVERSAL N/A 07/13/2017   Procedure: COLOSTOMY REVERSAL;  Surgeon: Judeth Horn, MD;  Location: [place];  Service: General;  Laterality: N/A;   CYST REMOVAL HAND Right 05/2013   thumb   DILATION AND CURETTAGE OF UTERUS  X 5-6   ESOPHAGOGASTRODUODENOSCOPY (EGD) WITH PROPOFOL N/A 03/13/2020   Procedure: ESOPHAGOGASTRODUODENOSCOPY (EGD) WITH PROPOFOL;  Surgeon: Carol Ada, MD;  [facility];   Service: Endoscopy;  Laterality: N/A;   EXCISION MASS ABDOMINAL N/A 07/09/2018   Procedure: EXPLORATION OF  ABDOMINAL WOUND ERAS PATHWAY;  Surgeon: Judeth Horn, MD;  Location: [place];  Service: General;  Laterality: N/A;   FLEXIBLE SIGMOIDOSCOPY N/A 08/24/2017   Procedure: Beryle Quant;  Surgeon: Carol Ada, MD;  Location: [place];  Service: Endoscopy;  Laterality: N/A;   FLEXIBLE SIGMOIDOSCOPY N/A 08/31/2017   Procedure: FLEXIBLE SIGMOIDOSCOPY;  Surgeon: Carol Ada, MD;  Location: [place];  Service: Endoscopy;  Laterality: N/A;  stent placement   FLEXIBLE SIGMOIDOSCOPY N/A 03/13/2020   Procedure: FLEXIBLE SIGMOIDOSCOPY;  Surgeon: Carol Ada, MD;  [facility];  Service: Endoscopy;  Laterality: N/A;   FLEXIBLE SIGMOIDOSCOPY N/A 12/18/2020   Procedure: FLEXIBLE SIGMOIDOSCOPY;  Surgeon: Carol Ada, MD;  [facility];  Service: Endoscopy;  Laterality: N/A;   HEMOSTASIS CLIP PLACEMENT  03/13/2020   Procedure: HEMOSTASIS CLIP PLACEMENT;  Surgeon: Carol Ada, MD;  [facility];  Service: Endoscopy;;   HEMOSTASIS CLIP PLACEMENT  12/18/2020   Procedure: HEMOSTASIS CLIP PLACEMENT;  Surgeon: Carol Ada, MD;  [facility];  Service: Endoscopy;;   HOT HEMOSTASIS N/A 03/13/2020   Procedure: HOT HEMOSTASIS (ARGON PLASMA COAGULATION/BICAP);  Surgeon: Carol Ada, MD;  [facility];  Service: Endoscopy;  Laterality: N/A;   HOT HEMOSTASIS N/A 12/18/2020   Procedure: HOT HEMOSTASIS (ARGON PLASMA COAGULATION/BICAP);  Surgeon: Carol Ada, MD;  [facility];  Service: Endoscopy;  Laterality: N/A;   IMPLANTABLE CARDIOVERTER DEFIBRILLATOR GENERATOR CHANGE  2008   INSERTION OF MESH N/A 07/09/2018   Procedure: INSERTION OF VICRYL MESH;  Surgeon: Judeth Horn, MD;  Location: [place];  Service: General;  Laterality: N/A;   LAPAROTOMY N/A 09/18/2017   Procedure: EXPLORATORY LAPAROTOMY;  Surgeon: Judeth Horn, MD;  Location: [place];   Service: General;  Laterality: N/A;   LEAD REVISION  06/22/2018   LEAD REVISION/REPAIR N/A 06/22/2018   Procedure: LEAD REVISION/REPAIR;  Surgeon: Deboraha Sprang, MD;  Location: [place] [facility];  Service: Cardiovascular;  Laterality: N/A;   LYSIS OF ADHESION N/A 09/18/2017   Procedure: LYSIS OF ADHESION;  Surgeon: Judeth Horn, MD;  Location: [place];  Service: General;  Laterality: N/A;   LYSIS OF ADHESION N/A 07/09/2018   Procedure: LYSIS OF ADHESION;  Surgeon: Judeth Horn, MD;  Location: [place];  Service: General;  Laterality: N/A;   PARTIAL COLECTOMY N/A 09/18/2017   Procedure: ILEOCOLECTOMY;  Surgeon: Judeth Horn, MD;  Location: [place];  Service: General;  Laterality: N/A;   PARTIAL COLECTOMY  09/25/2017   POLYPECTOMY  03/13/2020  Procedure: POLYPECTOMY;  Surgeon: Carol Ada, MD;  [facility];  Service: Endoscopy;;   RIGHT/LEFT HEART CATH AND CORONARY ANGIOGRAPHY N/A 06/01/2018   Procedure: RIGHT/LEFT HEART CATH AND CORONARY ANGIOGRAPHY;  Surgeon: Jettie Booze, MD;  Location: [place] [facility];  Service: Cardiovascular;  Laterality: N/A;   SIGMOIDOSCOPY N/A 09/18/2017   Procedure: SIGMOIDOSCOPY;  Surgeon: Judeth Horn, MD;  Location: [place];  Service: General;  Laterality: N/A;   SMALL INTESTINE SURGERY  07/09/2018   EXPLORATION OF  ABDOMINAL WOUND ERAS PATHWAYN/; MESH; LYSIS OF ADHESIONS   TOTAL ABDOMINAL HYSTERECTOMY  1990   TRANSFORAMINAL LUMBAR INTERBODY FUSION (TLIF) WITH PEDICLE SCREW FIXATION 2 LEVEL N/A 01/07/2020   Procedure: Lumbar Four-Five and Lumbar Five-Sacral One open lumbar decompression and transforaminal lumbar interbody fusion;  Surgeon: Judith Part, MD;  Location: [place];  Service: Neurosurgery;  Laterality: N/A;  Lumbar Four-Five and Lumbar Five-Sacral One open lumbar decompression and transforaminal lumbar interbody fusion   TRIGGER FINGER RELEASE Right 05/213   TRIGGER FINGER RELEASE Right 04/08/2019   Procedure: RIGHT INDEX RELEASE  TRIGGER FINGER/A-1 PULLEY;  Surgeon: Milly Jakob, MD;  Location: [place];  Service: Orthopedics;  Laterality: Right;   TUBAL LIGATION  1980s   VESICOVAGINAL FISTULA CLOSURE W/ TAH     Patient Active Problem List   Diagnosis Date Noted   Hypermagnesemia 10/27/2021   Diabetes mellitus without complication ([place]) 16/04/9603   Hav (hallux abducto valgus), unspecified laterality 06/26/2020   Lumbar stenosis with neurogenic claudication 01/07/2020   Generalized abdominal pain    CKD (chronic kidney disease) stage 4, GFR 15-29 ml/min (HCC)    SBO (small bowel obstruction) ([place]) 08/17/2019   Hyponatremia 08/17/2019   COVID-19 virus infection 08/17/2019   Acute lower UTI 08/17/2019   Prolonged QT interval 08/17/2019   Acute hypoxemic respiratory failure due to COVID-19 ([place]) 08/07/2019   Normocytic anemia 08/07/2019   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   Congestive heart failure (CHF) ([place]) 54/03/8118   Acute systolic heart failure ([place])    Type 2 diabetes mellitus with stage 3 chronic kidney disease, with long-term current use of insulin ([place]) 11/22/2017   Acute respiratory failure with hypoxia ([place]) 11/22/2017   Bowel obstruction ([place]) 08/23/2017   Colonic obstruction ([place]) 08/22/2017   Dehydration    Intractable vomiting with nausea    Hyperkalemia    Gastroenteritis 08/11/2017   Hypotension 08/11/2017   Acute renal failure superimposed on stage 3 chronic kidney disease ([place]) 08/11/2017   Hypovolemia 08/10/2017   Colostomy in place ([place]) 07/13/2017   Adjustment disorder with depressed mood    Debilitated 02/06/2017   S/P colostomy ([place]) 01/31/2017   Pressure injury of skin 01/28/2017   Colitis 01/26/2017   Acute MI ([place]) 05/05/2014   Anemia, iron deficiency 05/05/2014   Airway hyperreactivity 05/05/2014   Diabetes mellitus, type 2 ([place]) 14/78/2956   Chronic systolic heart failure ([place]) 05/05/2014   HLD (hyperlipidemia) 05/05/2014   Hypothyroidism 05/05/2014   Nonischemic  cardiomyopathy ([place]) 02/02/2014   LBBB (left bundle branch block) 21/30/8657   Chronic systolic dysfunction of left ventricle 02/02/2014   Chronic renal insufficiency 02/02/2014   Essential hypertension 02/02/2014   Morbid obesity ([place]) 02/02/2014   Coronary artery disease 08/15/2013   Shock ([place]) 08/15/2013   History of prolonged Q-T interval on ECG 10/31/2012   Hx of cardiomyopathy 10/31/2012   Automatic implantable cardioverter-defibrillator in situ 08/06/2008    PCP: Crist Infante MD  REFERRING PROVIDER: Alysia Penna MD   REFERRING DIAG:  Diagnosis  G57.02 (ICD-10-CM) - Piriformis syndrome of RIGHT side    THERAPY DIAG:  Pain in right hip  Other low back pain  Decreased functional mobility and endurance  Rationale for Evaluation and Treatment: Rehabilitation  ONSET DATE: about 1 month ago   SUBJECTIVE:   SUBJECTIVE STATEMENT: Pt with new onset of Rt sided hip pain which is familiar to her, she has had in her L side before.  Patient has difficulty putting pressure on the right side of her body in supine, sitting and standing.  He leans to the left and sitting.  Pain interferes with walking, radiates to post thigh most of the time.  She feels generally weak.  She needs knee surgery but MD not comfortable due to cardiac issues. Denies sensory .  She uses a walker in her home most of the time.  She cannot stand for only 5 minutes at a time before needing rest breaks.  PERTINENT HISTORY: Right knee Genicular nerve radiofrequency neurotomy  See above for lengthy history  PAIN:  Are you having pain? Yes: NPRS scale: 8/10 Pain location: Rt buttock Pain description: knawing pain  Aggravating factors: pressure Relieving factors: heating pad , laying down   PRECAUTIONS: None  WEIGHT BEARING RESTRICTIONS: No  FALLS:  Has patient fallen in last 6 months? No  LIVING ENVIRONMENT: Lives with: lives with their family Lives in: House/apartment Stairs: Yes: Internal:  13 steps; on right going up often crawls up there vs walking  Has following equipment at home: Single point cane, Walker - 2 wheeled, and bed side commode  OCCUPATION: RN Retired  PLOF: Independent with basic ADLs and Independent with household mobility with device  PATIENT GOALS: Patient wants to relieve this pain .   NEXT MD VISIT: Next week for L knee.   OBJECTIVE:   DIAGNOSTIC FINDINGS: none   PATIENT SURVEYS:  FOTO 31%  COGNITION: Overall cognitive status: Within functional limits for tasks assessed     SENSATION: WFL  EDEMA:  NT  MUSCLE LENGTH: Hamstrings: Right tight, painful  deg; Left tight deg  POSTURE: rounded shoulders and flexed trunk   PALPATION: Patient with exquisite pain in the lower L 5 to S1, SIJ border extending laterally to outer hip.  LOWER EXTREMITY ROM:  Passive ROM Right eval Left eval  Hip flexion    Hip extension    Hip abduction    Hip adduction    Hip internal rotation 10-15 deg 15-20 deg   Hip external rotation good good  Knee flexion    Knee extension    Ankle dorsiflexion    Ankle plantarflexion    Ankle inversion    Ankle eversion     (Blank rows = not tested)  LOWER EXTREMITY MMT:  MMT Right eval Left eval  Hip flexion 4+/5 4/5  Hip extension    Hip abduction 3-/5 3-/5  Hip adduction    Hip internal rotation    Hip external rotation    Knee flexion 5/5 5/5  Knee extension 5/5 5/5  Ankle dorsiflexion    Ankle plantarflexion    Ankle inversion    Ankle eversion     (Blank rows = not tested)  LOWER EXTREMITY SPECIAL TESTS:  Hip special tests: NT due to pain   FUNCTIONAL TESTS:  5 times sit to stand: 24 sec  2 minute walk test: NT on eval   GAIT: Distance walked: 150 Assistive device utilized: Walker - 2 wheeled Level of assistance: SBA  Comments: walker pushed far out  from  upper body   TODAY'S TREATMENT:                                                                                                                               DATE: 08/09/22    PATIENT EDUCATION:  Education details: POC, HEP, differential diagnosis  Person educated: Patient Education method: Customer service manager Education comprehension: verbalized understanding  HOME EXERCISE PROGRAM: Access Code: [facility] URL: [email or web address] Date: 08/09/2022 Prepared by: Raeford Razor  Exercises - Supine Lower Trunk Rotation  - 1 x daily - 7 x weekly - 2 sets - 10 reps - 10 hold - Seated Hamstring Stretch  - 1 x daily - 7 x weekly - 1 sets - 3 reps - 30 hold - Seated Piriformis Stretch  - 1 x daily - 7 x weekly - 1 sets - 3 reps - 20-30 hold - Standing Hip Flexor Stretch  - 1 x daily - 7 x weekly - 1 sets - 3 reps - 20-30 hold  ASSESSMENT:  CLINICAL IMPRESSION: Patient is a 72 y.o. female who was seen today for physical therapy evaluation and treatment for Rt hip pain.  Differential diagnosis includes piriformis syndrome versus lumbar radiculopathy.  OBJECTIVE IMPAIRMENTS: cardiopulmonary status limiting activity, decreased activity tolerance, decreased endurance, decreased mobility, difficulty walking, decreased ROM, decreased strength, increased fascial restrictions, postural dysfunction, obesity, and pain.   ACTIVITY LIMITATIONS: carrying, lifting, bending, sitting, standing, squatting, sleeping, stairs, transfers, bed mobility, and locomotion level  PARTICIPATION LIMITATIONS: shopping and community activity  PERSONAL FACTORS: Age, Fitness, and 3+ comorbidities: Polyarthralgia, obesity, defibrillator, diabetes  are also affecting patient's functional outcome.   REHAB POTENTIAL: Good  CLINICAL DECISION MAKING: Stable/uncomplicated  EVALUATION COMPLEXITY: Low   GOALS: Goals reviewed with patient? Yes  SHORT TERM GOALS: Target date: 09/06/2022   Patient will be independent in initial home exercise program Baseline: Goal status: INITIAL  2.  Patient will be able to sit more  symmetrically on the right hip to develop normal movement patterns Baseline:  Goal status: INITIAL  3.  Patient will complete functional testing goals set (2-minute walk, TUG) Baseline:  Goal status: INITIAL  4.  Patient will begin to tolerate standing for up to 10 minutes to positively impact ADLs and quality of life Baseline:  Goal status: INITIAL   LONG TERM GOALS: Target date: 10/04/2022    Patient will improve FOTO score to 50% or greater to demonstrate improved functional mobility Baseline: 31% Goal status: INITIAL  2.  Patient will perform bed mobility without increased back and hip pain >50% of the time  Baseline:  Goal status: INITIAL  3.  Patient will be able to walk up stairs with modified independence using 1 rail, preferred pattern Baseline: often uses hands on step above to crawl  Goal status: INITIAL  4.  2 min walk test TBA /other goals  Baseline:  Goal status: INITIAL  5.  Patient will be independent with home  exercise program upon discharge Baseline:  Goal status: INITIAL    PLAN:  PT FREQUENCY: 2x/week  PT DURATION: 8 weeks  PLANNED INTERVENTIONS: Therapeutic exercises, Therapeutic activity, Neuromuscular re-education, Balance training, Gait training, Patient/Family education, Self Care, Joint mobilization, Stair training, Aquatic Therapy, Cryotherapy, Moist heat, Ionotophoresis '4mg'$ /ml Dexamethasone, Manual therapy, and Re-evaluation  PLAN FOR NEXT SESSION: 2-minute walk test.  NuStep.  Check home exercise program.  Consider manual therapy in side-lying to right hip/trunk   Thierry Dobosz, PT 08/09/2022, 6:37 PM   Raeford Razor, PT 08/09/22 6:37 PM Phone: [phone] Fax: [phone]

## 2022-08-09 ENCOUNTER — Ambulatory Visit: Payer: Medicare Other | Attending: Physical Medicine & Rehabilitation | Admitting: Physical Therapy

## 2022-08-09 DIAGNOSIS — G5702 Lesion of sciatic nerve, left lower limb: Secondary | ICD-10-CM | POA: Diagnosis not present

## 2022-08-09 DIAGNOSIS — M5459 Other low back pain: Secondary | ICD-10-CM | POA: Diagnosis not present

## 2022-08-09 DIAGNOSIS — M25551 Pain in right hip: Secondary | ICD-10-CM | POA: Insufficient documentation

## 2022-08-09 DIAGNOSIS — Z7409 Other reduced mobility: Secondary | ICD-10-CM | POA: Insufficient documentation

## 2022-08-10 DIAGNOSIS — Z794 Long term (current) use of insulin: Secondary | ICD-10-CM | POA: Diagnosis not present

## 2022-08-10 DIAGNOSIS — I130 Hypertensive heart and chronic kidney disease with heart failure and stage 1 through stage 4 chronic kidney disease, or unspecified chronic kidney disease: Secondary | ICD-10-CM | POA: Diagnosis not present

## 2022-08-10 DIAGNOSIS — E1129 Type 2 diabetes mellitus with other diabetic kidney complication: Secondary | ICD-10-CM | POA: Diagnosis not present

## 2022-08-10 DIAGNOSIS — N184 Chronic kidney disease, stage 4 (severe): Secondary | ICD-10-CM | POA: Diagnosis not present

## 2022-08-10 DIAGNOSIS — E785 Hyperlipidemia, unspecified: Secondary | ICD-10-CM | POA: Diagnosis not present

## 2022-08-11 NOTE — Therapy (Signed)
OUTPATIENT PHYSICAL THERAPY TREATMENT NOTE   Patient Name: Crystal [name] MRN: [id number] DOB:Mar 18, 1951, 72 y.o., female Today's Date: 08/12/2022  PCP: Crist Infante MD  REFERRING PROVIDER: Alysia Penna MD    END OF SESSION:   PT End of Session - 08/12/22 0748     Visit Number 2    Number of Visits 16    Date for PT Re-Evaluation 10/04/22    Authorization Type Medicare    PT Start Time 0716    PT Stop Time 0802    PT Time Calculation (min) 46 min    Activity Tolerance Patient tolerated treatment well    Behavior During Therapy [facility] for tasks assessed/performed             Past Medical History:  Diagnosis Date   AICD (automatic cardioverter/defibrillator) present    high RV threshold chronically, device was turned off in 2014; Device battery has been dead x 7 years; "turned it back on 06/22/2018"   Anemia, iron deficiency    Angioedema    felt to likely be due to ace inhibitors but says she has had this even off of medicines, appears to be tolerating ARBs chronically   Anxiety    Arthritis    "hands, legs, arms; bad in my back" (06/22/2018)   Asthmatic bronchitis with status asthmaticus    Bradycardia    CHF (congestive heart failure) (HCC)    Chronic lower back pain    Chronic pain    CKD (chronic kidney disease), stage III (HCC)    Coronary artery disease    Mild nonobstructive (30% LAD, 30% RCA) by 09/01/16 cath at [facility]   Degenerative joint disease    Depression    Diabetes mellitus, type 2 ([place])    Diabetic peripheral neuropathy ([place])    Dizziness    Dyspnea on exertion    Family history of adverse reaction to anesthesia    Mother has nausea   GERD (gastroesophageal reflux disease)    Gout    Heart valve disorder    History of blood transfusion 1981; ~ 2005; 12/2016   "childbirth; defibrillator OR; colostomy OR"   History of mononucleosis 03/2014   Hyperlipidemia    Hypertension    Hypothyroid    Insomnia    Intervertebral disc  degeneration    Ischemic cardiomyopathy    LBBB (left bundle branch block)    Myocardial infarction ([place]) dx'd ~ 2005   Nonischemic cardiomyopathy ([place])    s/p ICD in 2005. EF has since recovered   Obesity    On home oxygen therapy    "2L at night" (06/22/2018)   OSA on CPAP    Pneumonia    "couple times; last time was 12/2016" (06/22/2018)   PONV (postoperative nausea and vomiting)    Presence of permanent cardiac pacemaker    [employer]   Swelling    Syncope    Systemic hypertension    Vitamin D deficiency    Past Surgical History:  Procedure Laterality Date   APPENDECTOMY  07/13/2017   BLADDER SUSPENSION  1990   "w/hysterectomy"   BOWEL RESECTION N/A 07/09/2018   Procedure: SMALL BOWEL RESECTION;  Surgeon: Judeth Horn, MD;  Location: [place];  Service: General;  Laterality: N/A;   CARDIAC CATHETERIZATION  2005   no obstructive CAD per patient   CARDIAC CATHETERIZATION  2018   CARDIAC DEFIBRILLATOR PLACEMENT  2005   BiV ICD implanted,  LV lead is an epicardial lead   CARPAL TUNNEL  RELEASE Left 07/25/2014   Dr.Williamson    CARPAL TUNNEL RELEASE Right 04/08/2019   Procedure: RIGHT CARPAL TUNNEL RELEASE ENDOSCOPIC;  Surgeon: Milly Jakob, MD;  Location: [employer];  Service: Orthopedics;  Laterality: Right;   CATARACT EXTRACTION W/ INTRAOCULAR LENS  IMPLANT, BILATERAL Bilateral    COLONIC STENT PLACEMENT N/A 08/31/2017   Procedure: COLONIC STENT PLACEMENT;  Surgeon: Carol Ada, MD;  Location: [place];  Service: Endoscopy;  Laterality: N/A;   COLONOSCOPY     2010-2011 Dr.Kipreos    COLOSTOMY  12/2016   Archie Endo 01/26/2017   COLOSTOMY REVERSAL N/A 07/13/2017   Procedure: COLOSTOMY REVERSAL;  Surgeon: Judeth Horn, MD;  Location: [place];  Service: General;  Laterality: N/A;   CYST REMOVAL HAND Right 05/2013   thumb   DILATION AND CURETTAGE OF UTERUS  X 5-6   ESOPHAGOGASTRODUODENOSCOPY (EGD) WITH PROPOFOL N/A 03/13/2020   Procedure: ESOPHAGOGASTRODUODENOSCOPY (EGD) WITH  PROPOFOL;  Surgeon: Carol Ada, MD;  [facility];  Service: Endoscopy;  Laterality: N/A;   EXCISION MASS ABDOMINAL N/A 07/09/2018   Procedure: EXPLORATION OF  ABDOMINAL WOUND ERAS PATHWAY;  Surgeon: Judeth Horn, MD;  Location: [place];  Service: General;  Laterality: N/A;   FLEXIBLE SIGMOIDOSCOPY N/A 08/24/2017   Procedure: Beryle Quant;  Surgeon: Carol Ada, MD;  Location: [place];  Service: Endoscopy;  Laterality: N/A;   FLEXIBLE SIGMOIDOSCOPY N/A 08/31/2017   Procedure: FLEXIBLE SIGMOIDOSCOPY;  Surgeon: Carol Ada, MD;  Location: [place];  Service: Endoscopy;  Laterality: N/A;  stent placement   FLEXIBLE SIGMOIDOSCOPY N/A 03/13/2020   Procedure: FLEXIBLE SIGMOIDOSCOPY;  Surgeon: Carol Ada, MD;  [facility];  Service: Endoscopy;  Laterality: N/A;   FLEXIBLE SIGMOIDOSCOPY N/A 12/18/2020   Procedure: FLEXIBLE SIGMOIDOSCOPY;  Surgeon: Carol Ada, MD;  [facility];  Service: Endoscopy;  Laterality: N/A;   HEMOSTASIS CLIP PLACEMENT  03/13/2020   Procedure: HEMOSTASIS CLIP PLACEMENT;  Surgeon: Carol Ada, MD;  [facility];  Service: Endoscopy;;   HEMOSTASIS CLIP PLACEMENT  12/18/2020   Procedure: HEMOSTASIS CLIP PLACEMENT;  Surgeon: Carol Ada, MD;  [facility];  Service: Endoscopy;;   HOT HEMOSTASIS N/A 03/13/2020   Procedure: HOT HEMOSTASIS (ARGON PLASMA COAGULATION/BICAP);  Surgeon: Carol Ada, MD;  Location[facility];  Service: Endoscopy;  Laterality: N/A;   HOT HEMOSTASIS N/A 12/18/2020   Procedure: HOT HEMOSTASIS (ARGON PLASMA COAGULATION/BICAP);  Surgeon: Carol Ada, MD;  Location[facility];  Service: Endoscopy;  Laterality: N/A;   IMPLANTABLE CARDIOVERTER DEFIBRILLATOR GENERATOR CHANGE  2008   INSERTION OF MESH N/A 07/09/2018   Procedure: INSERTION OF VICRYL MESH;  Surgeon: Judeth Horn, MD;  Location: [place];  Service: General;  Laterality: N/A;   LAPAROTOMY N/A 09/18/2017   Procedure:  EXPLORATORY LAPAROTOMY;  Surgeon: Judeth Horn, MD;  Location: [place];  Service: General;  Laterality: N/A;   LEAD REVISION  06/22/2018   LEAD REVISION/REPAIR N/A 06/22/2018   Procedure: LEAD REVISION/REPAIR;  Surgeon: Deboraha Sprang, MD;  Location: [place] [facility];  Service: Cardiovascular;  Laterality: N/A;   LYSIS OF ADHESION N/A 09/18/2017   Procedure: LYSIS OF ADHESION;  Surgeon: Judeth Horn, MD;  Location: [place];  Service: General;  Laterality: N/A;   LYSIS OF ADHESION N/A 07/09/2018   Procedure: LYSIS OF ADHESION;  Surgeon: Judeth Horn, MD;  Location: [place];  Service: General;  Laterality: N/A;   PARTIAL COLECTOMY N/A 09/18/2017   Procedure: ILEOCOLECTOMY;  Surgeon: Judeth Horn, MD;  Location: [place];  Service: General;  Laterality: N/A;  PARTIAL COLECTOMY  09/25/2017   POLYPECTOMY  03/13/2020   Procedure: POLYPECTOMY;  Surgeon: Carol Ada, MD;  [facility];  Service: Endoscopy;;   RIGHT/LEFT HEART CATH AND CORONARY ANGIOGRAPHY N/A 06/01/2018   Procedure: RIGHT/LEFT HEART CATH AND CORONARY ANGIOGRAPHY;  Surgeon: Jettie Booze, MD;  Location: [place] [facility];  Service: Cardiovascular;  Laterality: N/A;   SIGMOIDOSCOPY N/A 09/18/2017   Procedure: SIGMOIDOSCOPY;  Surgeon: Judeth Horn, MD;  Location: [place];  Service: General;  Laterality: N/A;   SMALL INTESTINE SURGERY  07/09/2018   EXPLORATION OF  ABDOMINAL WOUND ERAS PATHWAYN/; MESH; LYSIS OF ADHESIONS   TOTAL ABDOMINAL HYSTERECTOMY  1990   TRANSFORAMINAL LUMBAR INTERBODY FUSION (TLIF) WITH PEDICLE SCREW FIXATION 2 LEVEL N/A 01/07/2020   Procedure: Lumbar Four-Five and Lumbar Five-Sacral One open lumbar decompression and transforaminal lumbar interbody fusion;  Surgeon: Judith Part, MD;  Location: [place];  Service: Neurosurgery;  Laterality: N/A;  Lumbar Four-Five and Lumbar Five-Sacral One open lumbar decompression and transforaminal lumbar interbody fusion   TRIGGER FINGER RELEASE Right 05/213   TRIGGER  FINGER RELEASE Right 04/08/2019   Procedure: RIGHT INDEX RELEASE TRIGGER FINGER/A-1 PULLEY;  Surgeon: Milly Jakob, MD;  Location: [place];  Service: Orthopedics;  Laterality: Right;   TUBAL LIGATION  1980s   VESICOVAGINAL FISTULA CLOSURE W/ TAH     Patient Active Problem List   Diagnosis Date Noted   Hypermagnesemia 10/27/2021   Diabetes mellitus without complication ([place]) 16/04/9603   Hav (hallux abducto valgus), unspecified laterality 06/26/2020   Lumbar stenosis with neurogenic claudication 01/07/2020   Generalized abdominal pain    CKD (chronic kidney disease) stage 4, GFR 15-29 ml/min (HCC)    SBO (small bowel obstruction) ([place]) 08/17/2019   Hyponatremia 08/17/2019   COVID-19 virus infection 08/17/2019   Acute lower UTI 08/17/2019   Prolonged QT interval 08/17/2019   Acute hypoxemic respiratory failure due to COVID-19 ([place]) 08/07/2019   Normocytic anemia 08/07/2019   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   Congestive heart failure (CHF) ([place]) 54/03/8118   Acute systolic heart failure ([place])    Type 2 diabetes mellitus with stage 3 chronic kidney disease, with long-term current use of insulin ([place]) 11/22/2017   Acute respiratory failure with hypoxia ([place]) 11/22/2017   Bowel obstruction ([place]) 08/23/2017   Colonic obstruction ([place]) 08/22/2017   Dehydration    Intractable vomiting with nausea    Hyperkalemia    Gastroenteritis 08/11/2017   Hypotension 08/11/2017   Acute renal failure superimposed on stage 3 chronic kidney disease ([place]) 08/11/2017   Hypovolemia 08/10/2017   Colostomy in place ([place]) 07/13/2017   Adjustment disorder with depressed mood    Debilitated 02/06/2017   S/P colostomy ([place]) 01/31/2017   Pressure injury of skin 01/28/2017   Colitis 01/26/2017   Acute MI ([place]) 05/05/2014   Anemia, iron deficiency 05/05/2014   Airway hyperreactivity 05/05/2014   Diabetes mellitus, type 2 ([name]) 14/78/2956   Chronic systolic heart failure ([place]) 05/05/2014   HLD  (hyperlipidemia) 05/05/2014   Hypothyroidism 05/05/2014   Nonischemic cardiomyopathy ([place]) 02/02/2014   LBBB (left bundle branch block) 21/30/8657   Chronic systolic dysfunction of left ventricle 02/02/2014   Chronic renal insufficiency 02/02/2014   Essential hypertension 02/02/2014   Morbid obesity ([place]) 02/02/2014   Coronary artery disease 08/15/2013   Shock ([place]) 08/15/2013   History of prolonged Q-T interval on ECG 10/31/2012   Hx of cardiomyopathy 10/31/2012   Automatic implantable cardioverter-defibrillator in situ 08/06/2008    REFERRING DIAG: G57.02 (ICD-10-CM) -  Piriformis syndrome of RIGHT side   THERAPY DIAG:  Pain in right hip  Decreased functional mobility and endurance  Other low back pain  Rationale for Evaluation and Treatment Rehabilitation  ONSET DATE: about 1 month ago   SUBJECTIVE:    SUBJECTIVE STATEMENT: Pt reports no change in her r gluteal pain PAIN:  Are you having pain? Yes: NPRS scale: 8/10 Pain location: Rt buttock Pain description: knawing pain  Aggravating factors: pressure Relieving factors: heating pad , laying down    PRECAUTIONS: None   WEIGHT BEARING RESTRICTIONS: No   FALLS:  Has patient fallen in last 6 months? No   LIVING ENVIRONMENT: Lives with: lives with their family Lives in: House/apartment Stairs: Yes: Internal: 13 steps; on right going up often crawls up there vs walking  Has following equipment at home: Single point cane, Walker - 2 wheeled, and bed side commode   OCCUPATION: RN Retired   PLOF: Independent with basic ADLs and Independent with household mobility with device   PATIENT GOALS: Patient wants to relieve this pain .    NEXT MD VISIT: Next week for L knee.    OBJECTIVE: (objective measures completed at initial evaluation unless otherwise dated)    DIAGNOSTIC FINDINGS: none    PATIENT SURVEYS:  FOTO 31%   COGNITION: Overall cognitive status: Within functional limits for tasks assessed                          SENSATION: WFL   EDEMA:  NT   MUSCLE LENGTH: Hamstrings: Right tight, painful  deg; Left tight deg   POSTURE: rounded shoulders and flexed trunk    PALPATION: Patient with exquisite pain in the lower L 5 to S1, SIJ border extending laterally to outer hip.   LOWER EXTREMITY ROM:   Passive ROM Right eval Left eval  Hip flexion      Hip extension      Hip abduction      Hip adduction      Hip internal rotation 10-15 deg 15-20 deg   Hip external rotation good good  Knee flexion      Knee extension      Ankle dorsiflexion      Ankle plantarflexion      Ankle inversion      Ankle eversion       (Blank rows = not tested)   LOWER EXTREMITY MMT:   MMT Right eval Left eval  Hip flexion 4+/5 4/5  Hip extension      Hip abduction 3-/5 3-/5  Hip adduction      Hip internal rotation      Hip external rotation      Knee flexion 5/5 5/5  Knee extension 5/5 5/5  Ankle dorsiflexion      Ankle plantarflexion      Ankle inversion      Ankle eversion       (Blank rows = not tested)   LOWER EXTREMITY SPECIAL TESTS:  Hip special tests: NT due to pain    FUNCTIONAL TESTS:  5 times sit to stand: 24 sec  2 minute walk test: NT on eval    GAIT: Distance walked: 150 Assistive device utilized: Walker - 2 wheeled Level of assistance: SBA  Comments: walker pushed far out from  upper body     TODAY'S TREATMENT:  [facility] Adult PT Treatment:  DATE: 07/1922 Therapeutic Exercise: Supine Bridge 2 sets - 10 reps - 5 hold Hooklying Clamshell GTB 2 sets - 10 reps - 5 hold Supine Lower Trunk Rotation 5 reps - 10 hold Seated Hamstring Stretch 2 reps - 30 hold Seated Piriformis Stretch  2 reps - 20-30 hold Updated HEP Manual Therapy: TM/DTM to the R gluteal and piriformis muscles Skilled palpation to identify TrPs and taut muscle bands Trigger Point Dry Needling Treatment: Pre-treatment instruction: Patient instructed on dry  needling rationale, procedures, and possible side effects including pain during treatment (achy,cramping feeling), bruising, drop of blood, lightheadedness, nausea, sweating. Patient Consent Given: Yes Education handout provided: Yes Muscles treated: R piriformis, Gluteal min. Gluteal med  Needle size and number: .30x73m x 1 Electrical stimulation performed: No Parameters: N/A Treatment response/outcome: Twitch response elicited Post-treatment instructions: Patient instructed to expect possible mild to moderate muscle soreness later today and/or tomorrow. Patient instructed in methods to reduce muscle soreness and to continue prescribed HEP. If patient was dry needled over the lung field, patient was instructed on signs and symptoms of pneumothorax and, however unlikely, to see immediate medical attention should they occur. Patient was also educated on signs and symptoms of infection and to seek medical attention should they occur. Patient verbalized understanding of these instructions and education.                                                                                                                                 PATIENT EDUCATION:  Education details: POC, HEP, differential diagnosis  Person educated: Patient Education method: ECustomer service managerEducation comprehension: verbalized understanding   HOME EXERCISE PROGRAM: Access Code: [facility] [place] CcURL: [email or web address] Date: 08/12/2022 Prepared by: AGar Ponto Exercises - Supine Lower Trunk Rotation  - 1 x daily - 7 x weekly - 2 sets - 10 reps - 10 hold - Seated Hamstring Stretch  - 1 x daily - 7 x weekly - 1 sets - 3 reps - 30 hold - Seated Piriformis Stretch  - 1 x daily - 7 x weekly - 1 sets - 3 reps - 20-30 hold - Standing Hip Flexor Stretch  - 1 x daily - 7 x weekly - 1 sets - 3 reps - 20-30 hold - Hooklying Clamshell with Resistance  - 2 x daily - 7 x weekly - 2 sets - 10 reps - 5 hold - Supine  Bridge  - 1 x daily - 7 x weekly - 2 sets - 10 reps - 5 hold   ASSESSMENT:   CLINICAL IMPRESSION: Pt received manual therapy as noted above f/b TPDN to the R piriformis, glut min, and glut med muscles. Pt then completed R hip flexibility and strengthening/activation therex. Pt tolerated PT today without adverse effects. When ambulation post session, pt noted that her buttock pain was les with wt bearing on the R LE. Will assess pt's completed response to today's session on her next visit.  OBJECTIVE IMPAIRMENTS: cardiopulmonary status limiting activity, decreased activity tolerance, decreased endurance, decreased mobility, difficulty walking, decreased ROM, decreased strength, increased fascial restrictions, postural dysfunction, obesity, and pain.    ACTIVITY LIMITATIONS: carrying, lifting, bending, sitting, standing, squatting, sleeping, stairs, transfers, bed mobility, and locomotion level   PARTICIPATION LIMITATIONS: shopping and community activity   PERSONAL FACTORS: Age, Fitness, and 3+ comorbidities: Polyarthralgia, obesity, defibrillator, diabetes  are also affecting patient's functional outcome.    REHAB POTENTIAL: Good   CLINICAL DECISION MAKING: Stable/uncomplicated   EVALUATION COMPLEXITY: Low     GOALS: Goals reviewed with patient? Yes   SHORT TERM GOALS: Target date: 09/06/2022   Patient will be independent in initial home exercise program Baseline: Goal status: INITIAL   2.  Patient will be able to sit more symmetrically on the right hip to develop normal movement patterns Baseline:  Goal status: INITIAL   3.  Patient will complete functional testing goals set (2-minute walk, TUG) Baseline:  Goal status: INITIAL   4.  Patient will begin to tolerate standing for up to 10 minutes to positively impact ADLs and quality of life Baseline:  Goal status: INITIAL     LONG TERM GOALS: Target date: 10/04/2022     Patient will improve FOTO score to 50% or greater to  demonstrate improved functional mobility Baseline: 31% Goal status: INITIAL   2.  Patient will perform bed mobility without increased back and hip pain >50% of the time  Baseline:  Goal status: INITIAL   3.  Patient will be able to walk up stairs with modified independence using 1 rail, preferred pattern Baseline: often uses hands on step above to crawl  Goal status: INITIAL   4.  2 min walk test TBA /other goals  Baseline:  Goal status: INITIAL   5.  Patient will be independent with home exercise program upon discharge Baseline:  Goal status: INITIAL       PLAN:   PT FREQUENCY: 2x/week   PT DURATION: 8 weeks   PLANNED INTERVENTIONS: Therapeutic exercises, Therapeutic activity, Neuromuscular re-education, Balance training, Gait training, Patient/Family education, Self Care, Joint mobilization, Stair training, Aquatic Therapy, Cryotherapy, Moist heat, Ionotophoresis '4mg'$ /ml Dexamethasone, Manual therapy, and Re-evaluation   PLAN FOR NEXT SESSION: 2-minute walk test.  NuStep.  Check home exercise program.  Consider manual therapy in side-lying to right hip/trunk    Gar Ponto MS, PT 08/12/22 3:21 PM

## 2022-08-12 ENCOUNTER — Ambulatory Visit: Payer: Medicare Other

## 2022-08-12 DIAGNOSIS — M25551 Pain in right hip: Secondary | ICD-10-CM | POA: Diagnosis not present

## 2022-08-12 DIAGNOSIS — Z7409 Other reduced mobility: Secondary | ICD-10-CM

## 2022-08-12 DIAGNOSIS — M5459 Other low back pain: Secondary | ICD-10-CM

## 2022-08-12 DIAGNOSIS — G5702 Lesion of sciatic nerve, left lower limb: Secondary | ICD-10-CM | POA: Diagnosis not present

## 2022-08-12 NOTE — Patient Instructions (Signed)

## 2022-08-16 ENCOUNTER — Encounter (HOSPITAL_BASED_OUTPATIENT_CLINIC_OR_DEPARTMENT_OTHER): Payer: Medicare Other | Admitting: Physical Medicine & Rehabilitation

## 2022-08-16 ENCOUNTER — Encounter: Payer: Self-pay | Admitting: Physical Medicine & Rehabilitation

## 2022-08-16 VITALS — BP 124/67 | HR 80 | Temp 98.1°F | Ht 64.0 in | Wt 245.0 lb

## 2022-08-16 DIAGNOSIS — G5702 Lesion of sciatic nerve, left lower limb: Secondary | ICD-10-CM

## 2022-08-16 DIAGNOSIS — M170 Bilateral primary osteoarthritis of knee: Secondary | ICD-10-CM | POA: Diagnosis not present

## 2022-08-16 MED ORDER — BUPIVACAINE HCL 0.5 % IJ SOLN: 5.0000 mL | Freq: Once | INTRAMUSCULAR | Status: DC

## 2022-08-16 MED ORDER — LIDOCAINE HCL 1 % IJ SOLN
10.0000 mL | Freq: Once | INTRAMUSCULAR | Status: AC
  Administered 2022-08-16: 10 mL

## 2022-08-16 MED ORDER — LIDOCAINE HCL (PF) 2 % IJ SOLN
5.0000 mL | Freq: Once | INTRAMUSCULAR | Status: AC
  Administered 2022-08-16: 5 mL

## 2022-08-16 MED ORDER — OXYCODONE-ACETAMINOPHEN 5-325 MG PO TABS: 1.0000 | ORAL_TABLET | Freq: Three times a day (TID) | ORAL | 0 refills | Status: DC | PRN

## 2022-08-16 NOTE — Progress Notes (Signed)
   Left genicular nerve radiofrequency neurotomy under fluoroscopic guidance  Indication Chronic severe  knee pain that has not responded to PT, medication, and other conservative care.THere has been 50% relief of usual knee pain with genicular nerve blocks under fluoroscopic guidance  Informed consent was obtained after discussing risks and benefits of procedure with the patient.  These include bleeding, bruising and infection as well as foot numbness. Pt place in supine position on the fluoro table .  Static images identified distal femur and proximal tibia.  Lateral and medial supracondylar , as well as medial tibial flare prepped with betadine.  Marked under fluoro and then a 25 g 1.5 inch needle was used to anesthetize skin and subcutaneous tissue. 1.5 cc was infiltrate into each of 3 sites. Then a 18-gauge 10cm RF needle with a 35m curved active  tip  was inserted under fluoroscopic guidance first targeting the medial tibial flare midpoint. After bone contact was made lateral images confirmed proper positioning at midpoint of shart ,then Motor stim at 2 Hz demonstrated no motor twitch followed by injection of 169mof 2% lidocaine and radiofrequency lesioning 80 deg for 90 sec. . This same procedure was treated for the lateral and medial supracondylar areas using same equipment and technique. Patient tolerated procedure well. Post procedure instructions given.

## 2022-08-16 NOTE — Progress Notes (Signed)
  PROCEDURE RECORD [facility]   Name: Crystal [name] DOB:09/30/1950 MRN: [id number]  Date:08/16/2022  Physician: Alysia Penna, MD    Nurse/CMA: Jaionna Weisse RMA  Allergies:  Allergies  Allergen Reactions   Ace Inhibitors Swelling and Other (See Comments)    Angioedema Other reaction(s): swelling Other reaction(s): swelling Other reaction(s): swelling Other reaction(s): Unknown   Glucophage [Metformin Hcl] Other (See Comments)    Renal failure   Telmisartan Other (See Comments)    AVOID ARB/ ACEi in this patient due to recurrent AKI   Advicor [Niacin-Lovastatin Er] Other (See Comments)    Muscle aches   Bystolic [Nebivolol Hcl] Swelling    Whole body swelled   Erythromycin Diarrhea, Nausea And Vomiting and Other (See Comments)    *DERIVATIVES*  Other reaction(s): severe vomiting  Other reaction(s): Unknown   Lipitor [Atorvastatin] Other (See Comments)    Muscle aches   Lopid [Gemfibrozil] Other (See Comments)    Muscle aches   Statins Other (See Comments)    Muscle aches tolerates crestor Other reaction(s): Unknown Other reaction(s): muscle aches   Hydromorphone Other (See Comments)    Confusion Other reaction(s): confusion.can take lower level narcs but those cause a lot of itching Other reaction(s): confusion.can take lower level narcs but those cause a lot of itching   Lisinopril    Losartan     Other reaction(s): stopped 12/2018. ? due to worse gfr Other reaction(s): stopped 12/2018. ? due to worse gfr Other reaction(s): stopped 12/2018. ? due to worse gfr Other reaction(s): stopped 12/2018. ? due to worse gfr   Nebivolol Hcl Swelling    Other reaction(s): swelling Other reaction(s): swelling Other reaction(s): swelling Other reaction(s): Unknown   Other     Other reaction(s): muscle aches   Sacubitril-Valsartan     Other reaction(s): itching Other reaction(s): itching Other reaction(s): itching Other reaction(s): itching    Spironolactone     Other reaction(s): arf Other reaction(s): arf Other reaction(s): arf Other reaction(s): arf    Consent Signed: No.  Is patient diabetic? Yes.    CBG today? 126  Pregnant: No. LMP: No LMP recorded. Patient has had a hysterectomy. (age 71-55)  Anticoagulants: no Anti-inflammatory: no Antibiotics: no  Procedure: Left Knee genicular radiofrequency  Position: Supine Start Time: 12:21PM  End Time: 12:35  Fluoro Time: 51  RN/CMA Jasmene Goswami RMA Shaquan Missey RMA     Time 12:11 12:35    BP 124/67 98/56    Pulse 80 80    Respirations 16 16    O2 Sat 91 90    S/S 6 6    Pain Level 7/10 0/10     D/C home with Self patient A & O X 3, D/C instructions reviewed, and sits independently.

## 2022-08-17 ENCOUNTER — Ambulatory Visit: Payer: Medicare Other

## 2022-08-17 DIAGNOSIS — Z7409 Other reduced mobility: Secondary | ICD-10-CM | POA: Diagnosis not present

## 2022-08-17 DIAGNOSIS — M5459 Other low back pain: Secondary | ICD-10-CM

## 2022-08-17 DIAGNOSIS — M25551 Pain in right hip: Secondary | ICD-10-CM | POA: Diagnosis not present

## 2022-08-17 DIAGNOSIS — G5702 Lesion of sciatic nerve, left lower limb: Secondary | ICD-10-CM | POA: Diagnosis not present

## 2022-08-17 NOTE — Therapy (Signed)
OUTPATIENT PHYSICAL THERAPY TREATMENT NOTE   Patient Name: Crystal [name] MRN: [id number] DOB:1951/03/18, 72 y.o., female Today's Date: 08/17/2022  PCP: Crist Infante MD  REFERRING PROVIDER: Alysia Penna MD    END OF SESSION:   PT End of Session - 08/17/22 1102     Visit Number 3    Number of Visits 16    Date for PT Re-Evaluation 10/04/22    Authorization Type Medicare    PT Start Time 1100    PT Stop Time 1145    PT Time Calculation (min) 45 min    Activity Tolerance Patient tolerated treatment well    Behavior During Therapy [facility] for tasks assessed/performed             Past Medical History:  Diagnosis Date   AICD (automatic cardioverter/defibrillator) present    high RV threshold chronically, device was turned off in 2014; Device battery has been dead x 7 years; "turned it back on 06/22/2018"   Anemia, iron deficiency    Angioedema    felt to likely be due to ace inhibitors but says she has had this even off of medicines, appears to be tolerating ARBs chronically   Anxiety    Arthritis    "hands, legs, arms; bad in my back" (06/22/2018)   Asthmatic bronchitis with status asthmaticus    Bradycardia    CHF (congestive heart failure) (HCC)    Chronic lower back pain    Chronic pain    CKD (chronic kidney disease), stage III (HCC)    Coronary artery disease    Mild nonobstructive (30% LAD, 30% RCA) by 09/01/16 cath at [facility]   Degenerative joint disease    Depression    Diabetes mellitus, type 2 ([place])    Diabetic peripheral neuropathy ([place])    Dizziness    Dyspnea on exertion    Family history of adverse reaction to anesthesia    Mother has nausea   GERD (gastroesophageal reflux disease)    Gout    Heart valve disorder    History of blood transfusion 1981; ~ 2005; 12/2016   "childbirth; defibrillator OR; colostomy OR"   History of mononucleosis 03/2014   Hyperlipidemia    Hypertension    Hypothyroid    Insomnia    Intervertebral disc  degeneration    Ischemic cardiomyopathy    LBBB (left bundle branch block)    Myocardial infarction ([place]) dx'd ~ 2005   Nonischemic cardiomyopathy ([place])    s/p ICD in 2005. EF has since recovered   Obesity    On home oxygen therapy    "2L at night" (06/22/2018)   OSA on CPAP    Pneumonia    "couple times; last time was 12/2016" (06/22/2018)   PONV (postoperative nausea and vomiting)    Presence of permanent cardiac pacemaker    [employer]   Swelling    Syncope    Systemic hypertension    Vitamin D deficiency    Past Surgical History:  Procedure Laterality Date   APPENDECTOMY  07/13/2017   BLADDER SUSPENSION  1990   "w/hysterectomy"   BOWEL RESECTION N/A 07/09/2018   Procedure: SMALL BOWEL RESECTION;  Surgeon: Judeth Horn, MD;  Location: [place];  Service: General;  Laterality: N/A;   CARDIAC CATHETERIZATION  2005   no obstructive CAD per patient   CARDIAC CATHETERIZATION  2018   CARDIAC DEFIBRILLATOR PLACEMENT  2005   BiV ICD implanted,  LV lead is an epicardial lead   CARPAL TUNNEL  RELEASE Left 07/25/2014   Dr.Williamson    CARPAL TUNNEL RELEASE Right 04/08/2019   Procedure: RIGHT CARPAL TUNNEL RELEASE ENDOSCOPIC;  Surgeon: Milly Jakob, MD;  Location: [place];  Service: Orthopedics;  Laterality: Right;   CATARACT EXTRACTION W/ INTRAOCULAR LENS  IMPLANT, BILATERAL Bilateral    COLONIC STENT PLACEMENT N/A 08/31/2017   Procedure: COLONIC STENT PLACEMENT;  Surgeon: Carol Ada, MD;  Location: [place];  Service: Endoscopy;  Laterality: N/A;   COLONOSCOPY     2010-2011 Dr.Kipreos    COLOSTOMY  12/2016   Archie Endo 01/26/2017   COLOSTOMY REVERSAL N/A 07/13/2017   Procedure: COLOSTOMY REVERSAL;  Surgeon: Judeth Horn, MD;  Location: [place];  Service: General;  Laterality: N/A;   CYST REMOVAL HAND Right 05/2013   thumb   DILATION AND CURETTAGE OF UTERUS  X 5-6   ESOPHAGOGASTRODUODENOSCOPY (EGD) WITH PROPOFOL N/A 03/13/2020   Procedure: ESOPHAGOGASTRODUODENOSCOPY (EGD) WITH  PROPOFOL;  Surgeon: Carol Ada, MD;  [facility];  Service: Endoscopy;  Laterality: N/A;   EXCISION MASS ABDOMINAL N/A 07/09/2018   Procedure: EXPLORATION OF  ABDOMINAL WOUND ERAS PATHWAY;  Surgeon: Judeth Horn, MD;  Location: [place];  Service: General;  Laterality: N/A;   FLEXIBLE SIGMOIDOSCOPY N/A 08/24/2017   Procedure: Beryle Quant;  Surgeon: Carol Ada, MD;  Location: [place];  Service: Endoscopy;  Laterality: N/A;   FLEXIBLE SIGMOIDOSCOPY N/A 08/31/2017   Procedure: FLEXIBLE SIGMOIDOSCOPY;  Surgeon: Carol Ada, MD;  Location: [place];  Service: Endoscopy;  Laterality: N/A;  stent placement   FLEXIBLE SIGMOIDOSCOPY N/A 03/13/2020   Procedure: FLEXIBLE SIGMOIDOSCOPY;  Surgeon: Carol Ada, MD;  [facility];  Service: Endoscopy;  Laterality: N/A;   FLEXIBLE SIGMOIDOSCOPY N/A 12/18/2020   Procedure: FLEXIBLE SIGMOIDOSCOPY;  Surgeon: Carol Ada, MD;  [facility];  Service: Endoscopy;  Laterality: N/A;   HEMOSTASIS CLIP PLACEMENT  03/13/2020   Procedure: HEMOSTASIS CLIP PLACEMENT;  Surgeon: Carol Ada, MD;  [facility];  Service: Endoscopy;;   HEMOSTASIS CLIP PLACEMENT  12/18/2020   Procedure: HEMOSTASIS CLIP PLACEMENT;  Surgeon: Carol Ada, MD;  [facility];  Service: Endoscopy;;   HOT HEMOSTASIS N/A 03/13/2020   Procedure: HOT HEMOSTASIS (ARGON PLASMA COAGULATION/BICAP);  Surgeon: Carol Ada, MD;  [facility];  Service: Endoscopy;  Laterality: N/A;   HOT HEMOSTASIS N/A 12/18/2020   Procedure: HOT HEMOSTASIS (ARGON PLASMA COAGULATION/BICAP);  Surgeon: Carol Ada, MD;  Location[facility];  Service: Endoscopy;  Laterality: N/A;   IMPLANTABLE CARDIOVERTER DEFIBRILLATOR GENERATOR CHANGE  2008   INSERTION OF MESH N/A 07/09/2018   Procedure: INSERTION OF VICRYL MESH;  Surgeon: Judeth Horn, MD;  Location: [place];  Service: General;  Laterality: N/A;   LAPAROTOMY N/A 09/18/2017   Procedure:  EXPLORATORY LAPAROTOMY;  Surgeon: Judeth Horn, MD;  Location: [place];  Service: General;  Laterality: N/A;   LEAD REVISION  06/22/2018   LEAD REVISION/REPAIR N/A 06/22/2018   Procedure: LEAD REVISION/REPAIR;  Surgeon: Deboraha Sprang, MD;  Location: [place] [facility];  Service: Cardiovascular;  Laterality: N/A;   LYSIS OF ADHESION N/A 09/18/2017   Procedure: LYSIS OF ADHESION;  Surgeon: Judeth Horn, MD;  Location: [place];  Service: General;  Laterality: N/A;   LYSIS OF ADHESION N/A 07/09/2018   Procedure: LYSIS OF ADHESION;  Surgeon: Judeth Horn, MD;  Location: [place];  Service: General;  Laterality: N/A;   PARTIAL COLECTOMY N/A 09/18/2017   Procedure: ILEOCOLECTOMY;  Surgeon: Judeth Horn, MD;  Location: [place];  Service: General;  Laterality: N/A;  PARTIAL COLECTOMY  09/25/2017   POLYPECTOMY  03/13/2020   Procedure: POLYPECTOMY;  Surgeon: Carol Ada, MD;  [facility];  Service: Endoscopy;;   RIGHT/LEFT HEART CATH AND CORONARY ANGIOGRAPHY N/A 06/01/2018   Procedure: RIGHT/LEFT HEART CATH AND CORONARY ANGIOGRAPHY;  Surgeon: Jettie Booze, MD;  Location: [place] [facility];  Service: Cardiovascular;  Laterality: N/A;   SIGMOIDOSCOPY N/A 09/18/2017   Procedure: SIGMOIDOSCOPY;  Surgeon: Judeth Horn, MD;  Location: [place];  Service: General;  Laterality: N/A;   SMALL INTESTINE SURGERY  07/09/2018   EXPLORATION OF  ABDOMINAL WOUND ERAS PATHWAYN/; MESH; LYSIS OF ADHESIONS   TOTAL ABDOMINAL HYSTERECTOMY  1990   TRANSFORAMINAL LUMBAR INTERBODY FUSION (TLIF) WITH PEDICLE SCREW FIXATION 2 LEVEL N/A 01/07/2020   Procedure: Lumbar Four-Five and Lumbar Five-Sacral One open lumbar decompression and transforaminal lumbar interbody fusion;  Surgeon: Judith Part, MD;  Location: [place];  Service: Neurosurgery;  Laterality: N/A;  Lumbar Four-Five and Lumbar Five-Sacral One open lumbar decompression and transforaminal lumbar interbody fusion   TRIGGER FINGER RELEASE Right 05/213   TRIGGER  FINGER RELEASE Right 04/08/2019   Procedure: RIGHT INDEX RELEASE TRIGGER FINGER/A-1 PULLEY;  Surgeon: Milly Jakob, MD;  Location: [place];  Service: Orthopedics;  Laterality: Right;   TUBAL LIGATION  1980s   VESICOVAGINAL FISTULA CLOSURE W/ TAH     Patient Active Problem List   Diagnosis Date Noted   Hypermagnesemia 10/27/2021   Diabetes mellitus without complication ([place]) 40/98/1191   Hav (hallux abducto valgus), unspecified laterality 06/26/2020   Lumbar stenosis with neurogenic claudication 01/07/2020   Generalized abdominal pain    CKD (chronic kidney disease) stage 4, GFR 15-29 ml/min (HCC)    SBO (small bowel obstruction) ([place]) 08/17/2019   Hyponatremia 08/17/2019   COVID-19 virus infection 08/17/2019   Acute lower UTI 08/17/2019   Prolonged QT interval 08/17/2019   Acute hypoxemic respiratory failure due to COVID-19 ([place]) 08/07/2019   Normocytic anemia 08/07/2019   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   Congestive heart failure (CHF) ([place]) 47/82/9562   Acute systolic heart failure ([place])    Type 2 diabetes mellitus with stage 3 chronic kidney disease, with long-term current use of insulin ([place]) 11/22/2017   Acute respiratory failure with hypoxia ([place]) 11/22/2017   Bowel obstruction ([place]) 08/23/2017   Colonic obstruction ([place]) 08/22/2017   Dehydration    Intractable vomiting with nausea    Hyperkalemia    Gastroenteritis 08/11/2017   Hypotension 08/11/2017   Acute renal failure superimposed on stage 3 chronic kidney disease ([place]) 08/11/2017   Hypovolemia 08/10/2017   Colostomy in place ([place]) 07/13/2017   Adjustment disorder with depressed mood    Debilitated 02/06/2017   S/P colostomy ([place]) 01/31/2017   Pressure injury of skin 01/28/2017   Colitis 01/26/2017   Acute MI ([place]) 05/05/2014   Anemia, iron deficiency 05/05/2014   Airway hyperreactivity 05/05/2014   Diabetes mellitus, type 2 ([place]) 13/02/6577   Chronic systolic heart failure ([place]) 05/05/2014   HLD  (hyperlipidemia) 05/05/2014   Hypothyroidism 05/05/2014   Nonischemic cardiomyopathy ([place]) 02/02/2014   LBBB (left bundle branch block) 46/96/2952   Chronic systolic dysfunction of left ventricle 02/02/2014   Chronic renal insufficiency 02/02/2014   Essential hypertension 02/02/2014   Morbid obesity ([place]) 02/02/2014   Coronary artery disease 08/15/2013   Shock ([place]) 08/15/2013   History of prolonged Q-T interval on ECG 10/31/2012   Hx of cardiomyopathy 10/31/2012   Automatic implantable cardioverter-defibrillator in situ 08/06/2008    REFERRING DIAG: G57.02 (ICD-10-CM) -  Piriformis syndrome of RIGHT side   THERAPY DIAG:  Pain in right hip  Decreased functional mobility and endurance  Other low back pain  Rationale for Evaluation and Treatment Rehabilitation  ONSET DATE: about 1 month ago   SUBJECTIVE:    SUBJECTIVE STATEMENT: Pt reports the TPDN was helpful decreasing her R gluteal pain. She notes she still feel pain in her lower R buttock. PAIN:  Are you having pain? Yes: NPRS scale: 5/10 Pain location: Rt buttock Pain description: knawing pain  Aggravating factors: pressure Relieving factors: heating pad , laying down    PRECAUTIONS: None   WEIGHT BEARING RESTRICTIONS: No   FALLS:  Has patient fallen in last 6 months? No   LIVING ENVIRONMENT: Lives with: lives with their family Lives in: House/apartment Stairs: Yes: Internal: 13 steps; on right going up often crawls up there vs walking  Has following equipment at home: Single point cane, Walker - 2 wheeled, and bed side commode   OCCUPATION: RN Retired   PLOF: Independent with basic ADLs and Independent with household mobility with device   PATIENT GOALS: Patient wants to relieve this pain .    NEXT MD VISIT: Next week for L knee.    OBJECTIVE: (objective measures completed at initial evaluation unless otherwise dated)    DIAGNOSTIC FINDINGS: none    PATIENT SURVEYS:  FOTO 31%   COGNITION: Overall  cognitive status: Within functional limits for tasks assessed                         SENSATION: WFL   EDEMA:  NT   MUSCLE LENGTH: Hamstrings: Right tight, painful  deg; Left tight deg   POSTURE: rounded shoulders and flexed trunk    PALPATION: Patient with exquisite pain in the lower L 5 to S1, SIJ border extending laterally to outer hip.   LOWER EXTREMITY ROM:   Passive ROM Right eval Left eval  Hip flexion      Hip extension      Hip abduction      Hip adduction      Hip internal rotation 10-15 deg 15-20 deg   Hip external rotation good good  Knee flexion      Knee extension      Ankle dorsiflexion      Ankle plantarflexion      Ankle inversion      Ankle eversion       (Blank rows = not tested)   LOWER EXTREMITY MMT:   MMT Right eval Left eval  Hip flexion 4+/5 4/5  Hip extension      Hip abduction 3-/5 3-/5  Hip adduction      Hip internal rotation      Hip external rotation      Knee flexion 5/5 5/5  Knee extension 5/5 5/5  Ankle dorsiflexion      Ankle plantarflexion      Ankle inversion      Ankle eversion       (Blank rows = not tested)   LOWER EXTREMITY SPECIAL TESTS:  Hip special tests: NT due to pain    FUNCTIONAL TESTS:  5 times sit to stand: 24 sec  2 minute walk test: NT on eval 08/17/22=340"   GAIT: Distance walked: 150 Assistive device utilized: Walker - 2 wheeled Level of assistance: SBA  Comments: walker pushed far out from  upper body     TODAY'S TREATMENT: [facility] Adult PT Treatment:  DATE: 08/17/22 Therapeutic Exercise: Supine Bridge 2 sets - 10 reps - 5 hold Hooklying Clamshell GTB 2 sets - 10 reps - 5 hold Seated abdominal press x10 3" Supine Lower Trunk Rotation 5 reps - 10 hold Seated Hamstring Stretch 2 reps - 30 hold each Seated Piriformis Stretch  2 reps - 30 hold each Updated HEP Manual Therapy: DTM to the r piriformis and gluteal muscles Therapeutic  Activity: 2MWT Self Care: Use of tennis ball for R gluteal massage  [facility] Adult PT Treatment:                                                DATE: 07/1922 Therapeutic Exercise: Supine Bridge 2 sets - 10 reps - 5 hold Hooklying Clamshell GTB 2 sets - 10 reps - 5 hold Supine Lower Trunk Rotation 5 reps - 10 hold Seated Hamstring Stretch 2 reps - 30 hold Seated Piriformis Stretch  2 reps - 20-30 hold Updated HEP Manual Therapy: TM/DTM to the R gluteal and piriformis muscles Skilled palpation to identify TrPs and taut muscle bands Trigger Point Dry Needling Treatment: Pre-treatment instruction: Patient instructed on dry needling rationale, procedures, and possible side effects including pain during treatment (achy,cramping feeling), bruising, drop of blood, lightheadedness, nausea, sweating. Patient Consent Given: Yes Education handout provided: Yes Muscles treated: R piriformis, Gluteal min. Gluteal med  Needle size and number: .30x70m x 1 Electrical stimulation performed: No Parameters: N/A Treatment response/outcome: Twitch response elicited Post-treatment instructions: Patient instructed to expect possible mild to moderate muscle soreness later today and/or tomorrow. Patient instructed in methods to reduce muscle soreness and to continue prescribed HEP. If patient was dry needled over the lung field, patient was instructed on signs and symptoms of pneumothorax and, however unlikely, to see immediate medical attention should they occur. Patient was also educated on signs and symptoms of infection and to seek medical attention should they occur. Patient verbalized understanding of these instructions and education.                                                                                                                                 PATIENT EDUCATION:  Education details: POC, HEP, differential diagnosis  Person educated: Patient Education method: EHoliday representativeEducation comprehension: verbalized understanding   HOME EXERCISE PROGRAM: Access Code: GJ[facility] Center LLCURL: [email or web address] Date: 08/17/2022 Prepared by: AGar Ponto Exercises - Supine Lower Trunk Rotation  - 1 x daily - 7 x weekly - 2 sets - 10 reps - 10 hold - Seated Hamstring Stretch  - 1 x daily - 7 x weekly - 1 sets - 3 reps - 30 hold - Seated Piriformis Stretch  - 1 x daily - 7 x weekly - 1 sets - 3 reps - 20-30 hold - Standing Hip Flexor Stretch  -  1 x daily - 7 x weekly - 1 sets - 3 reps - 20-30 hold - Bridge with Hip Abduction and Resistance  - 1 x daily - 7 x weekly - 2 sets - 10 reps - 5 hold - Clamshell with Resistance  - 1 x daily - 7 x weekly - 2 sets - 10 reps - 5 hold - Seated Abdominal Press into Swiss Ball  - 1 x daily - 7 x weekly - 2 sets - 10 reps - 2 hold   ASSESSMENT:   CLINICAL IMPRESSION: Pt's report indicates a positive response to the TPDN session completed the last visit. Pt presented walking with an increase ambulation pace. PT was completed a 2MWT,  DTM to the R piriformis and gluteal muscles, then lumbopelvic flexibility and strengthening. Pt tolerated PT today without adverse effects. Pt will continue to benefit from skilled PT to address impairments for improved function.    OBJECTIVE IMPAIRMENTS: cardiopulmonary status limiting activity, decreased activity tolerance, decreased endurance, decreased mobility, difficulty walking, decreased ROM, decreased strength, increased fascial restrictions, postural dysfunction, obesity, and pain.    ACTIVITY LIMITATIONS: carrying, lifting, bending, sitting, standing, squatting, sleeping, stairs, transfers, bed mobility, and locomotion level   PARTICIPATION LIMITATIONS: shopping and community activity   PERSONAL FACTORS: Age, Fitness, and 3+ comorbidities: Polyarthralgia, obesity, defibrillator, diabetes  are also affecting patient's functional outcome.    REHAB POTENTIAL: Good    CLINICAL DECISION MAKING: Stable/uncomplicated   EVALUATION COMPLEXITY: Low     GOALS: Goals reviewed with patient? Yes   SHORT TERM GOALS: Target date: 09/06/2022   Patient will be independent in initial home exercise program Baseline: Goal status: Ongoing   2.  Patient will be able to sit more symmetrically on the right hip to develop normal movement patterns Baseline:  Goal status: INITIAL   3.  Patient will complete functional testing goals set (2-minute walk, TUG) Baseline:  Goal status: MET   4.  Patient will begin to tolerate standing for up to 10 minutes to positively impact ADLs and quality of life Baseline:  Goal status: INITIAL     LONG TERM GOALS: Target date: 10/04/2022     Patient will improve FOTO score to 50% or greater to demonstrate improved functional mobility Baseline: 31% Goal status: INITIAL   2.  Patient will perform bed mobility without increased back and hip pain >50% of the time  Baseline:  Goal status: INITIAL   3.  Patient will be able to walk up stairs with modified independence using 1 rail, preferred pattern Baseline: often uses hands on step above to crawl  Goal status: INITIAL   4.  2 min walk test TBA /other goals  Baseline:  Goal status: INITIAL   5.  Patient will be independent with home exercise program upon discharge Baseline:  Goal status: INITIAL       PLAN:   PT FREQUENCY: 2x/week   PT DURATION: 8 weeks   PLANNED INTERVENTIONS: Therapeutic exercises, Therapeutic activity, Neuromuscular re-education, Balance training, Gait training, Patient/Family education, Self Care, Joint mobilization, Stair training, Aquatic Therapy, Cryotherapy, Moist heat, Ionotophoresis '4mg'$ /ml Dexamethasone, Manual therapy, and Re-evaluation   PLAN FOR NEXT SESSION: 2-minute walk test.  NuStep.  Check home exercise program.  Consider manual therapy in side-lying to right hip/trunk    Gar Ponto MS, PT 08/17/22 12:02 PM

## 2022-08-19 ENCOUNTER — Ambulatory Visit: Payer: Medicare Other

## 2022-08-19 DIAGNOSIS — Z7409 Other reduced mobility: Secondary | ICD-10-CM

## 2022-08-19 DIAGNOSIS — M25551 Pain in right hip: Secondary | ICD-10-CM | POA: Diagnosis not present

## 2022-08-19 DIAGNOSIS — M5459 Other low back pain: Secondary | ICD-10-CM | POA: Diagnosis not present

## 2022-08-19 DIAGNOSIS — G5702 Lesion of sciatic nerve, left lower limb: Secondary | ICD-10-CM | POA: Diagnosis not present

## 2022-08-19 NOTE — Therapy (Signed)
OUTPATIENT PHYSICAL THERAPY TREATMENT NOTE   Patient Name: Crystal [name] MRN: [id number] DOB:[birth year], 72 y.o., female Today's Date: 08/19/2022  PCP: Crist Infante MD  REFERRING PROVIDER: Alysia Penna MD    END OF SESSION:   PT End of Session - 08/19/22 1019     Visit Number 4    Number of Visits 16    Date for PT Re-Evaluation 10/04/22    Authorization Type Medicare    PT Start Time 1018    PT Stop Time 1100    PT Time Calculation (min) 42 min    Activity Tolerance Patient tolerated treatment well    Behavior During Therapy WFL for tasks assessed/performed             Past Medical History:  Diagnosis Date   AICD (automatic cardioverter/defibrillator) present    high RV threshold chronically, device was turned off in 2014; Device battery has been dead x 7 years; "turned it back on 06/22/2018"   Anemia, iron deficiency    Angioedema    felt to likely be due to ace inhibitors but says she has had this even off of medicines, appears to be tolerating ARBs chronically   Anxiety    Arthritis    "hands, legs, arms; bad in my back" (06/22/2018)   Asthmatic bronchitis with status asthmaticus    Bradycardia    CHF (congestive heart failure) (HCC)    Chronic lower back pain    Chronic pain    CKD (chronic kidney disease), stage III ([place])    Coronary artery disease    Mild nonobstructive (30% LAD, 30% RCA) by 09/01/16 cath at [facility]   Degenerative joint disease    Depression    Diabetes mellitus, type 2 ([place])    Diabetic peripheral neuropathy ([place])    Dizziness    Dyspnea on exertion    Family history of adverse reaction to anesthesia    Mother has nausea   GERD (gastroesophageal reflux disease)    Gout    Heart valve disorder    History of blood transfusion 1981; ~ 2005; 12/2016   "childbirth; defibrillator OR; colostomy OR"   History of mononucleosis 03/2014   Hyperlipidemia    Hypertension    Hypothyroid    Insomnia    Intervertebral disc  degeneration    Ischemic cardiomyopathy    LBBB (left bundle branch block)    Myocardial infarction ([place]) dx'd ~ 2005   Nonischemic cardiomyopathy ([place])    s/p ICD in 2005. EF has since recovered   Obesity    On home oxygen therapy    "2L at night" (06/22/2018)   OSA on CPAP    Pneumonia    "couple times; last time was 12/2016" (06/22/2018)   PONV (postoperative nausea and vomiting)    Presence of permanent cardiac pacemaker    [employer]   Swelling    Syncope    Systemic hypertension    Vitamin D deficiency    Past Surgical History:  Procedure Laterality Date   APPENDECTOMY  07/13/2017   BLADDER SUSPENSION  1990   "w/hysterectomy"   BOWEL RESECTION N/A 07/09/2018   Procedure: SMALL BOWEL RESECTION;  Surgeon: Judeth Horn, MD;  Location: [place];  Service: General;  Laterality: N/A;   CARDIAC CATHETERIZATION  2005   no obstructive CAD per patient   CARDIAC CATHETERIZATION  2018   CARDIAC DEFIBRILLATOR PLACEMENT  2005   BiV ICD implanted,  LV lead is an epicardial lead   CARPAL TUNNEL  RELEASE Left 07/25/2014   Dr.Williamson    CARPAL TUNNEL RELEASE Right 04/08/2019   Procedure: RIGHT CARPAL TUNNEL RELEASE ENDOSCOPIC;  Surgeon: Milly Jakob, MD;  Location: [place];  Service: Orthopedics;  Laterality: Right;   CATARACT EXTRACTION W/ INTRAOCULAR LENS  IMPLANT, BILATERAL Bilateral    COLONIC STENT PLACEMENT N/A 08/31/2017   Procedure: COLONIC STENT PLACEMENT;  Surgeon: Carol Ada, MD;  Location: [place];  Service: Endoscopy;  Laterality: N/A;   COLONOSCOPY     2010-2011 Dr.Kipreos    COLOSTOMY  12/2016   Archie Endo 01/26/2017   COLOSTOMY REVERSAL N/A 07/13/2017   Procedure: COLOSTOMY REVERSAL;  Surgeon: Judeth Horn, MD;  Location: [place];  Service: General;  Laterality: N/A;   CYST REMOVAL HAND Right 05/2013   thumb   DILATION AND CURETTAGE OF UTERUS  X 5-6   ESOPHAGOGASTRODUODENOSCOPY (EGD) WITH PROPOFOL N/A 03/13/2020   Procedure: ESOPHAGOGASTRODUODENOSCOPY (EGD) WITH  PROPOFOL;  Surgeon: Carol Ada, MD;  [facility];  Service: Endoscopy;  Laterality: N/A;   EXCISION MASS ABDOMINAL N/A 07/09/2018   Procedure: EXPLORATION OF  ABDOMINAL WOUND ERAS PATHWAY;  Surgeon: Judeth Horn, MD;  Location: [place];  Service: General;  Laterality: N/A;   FLEXIBLE SIGMOIDOSCOPY N/A 08/24/2017   Procedure: Beryle Quant;  Surgeon: Carol Ada, MD;  Location: [place];  Service: Endoscopy;  Laterality: N/A;   FLEXIBLE SIGMOIDOSCOPY N/A 08/31/2017   Procedure: FLEXIBLE SIGMOIDOSCOPY;  Surgeon: Carol Ada, MD;  Location: [place];  Service: Endoscopy;  Laterality: N/A;  stent placement   FLEXIBLE SIGMOIDOSCOPY N/A 03/13/2020   Procedure: FLEXIBLE SIGMOIDOSCOPY;  Surgeon: Carol Ada, MD;  [facility];  Service: Endoscopy;  Laterality: N/A;   FLEXIBLE SIGMOIDOSCOPY N/A 12/18/2020   Procedure: FLEXIBLE SIGMOIDOSCOPY;  Surgeon: Carol Ada, MD;  [facility];  Service: Endoscopy;  Laterality: N/A;   HEMOSTASIS CLIP PLACEMENT  03/13/2020   Procedure: HEMOSTASIS CLIP PLACEMENT;  Surgeon: Carol Ada, MD;  [facility];  Service: Endoscopy;;   HEMOSTASIS CLIP PLACEMENT  12/18/2020   Procedure: HEMOSTASIS CLIP PLACEMENT;  Surgeon: Carol Ada, MD;  [facility];  Service: Endoscopy;;   HOT HEMOSTASIS N/A 03/13/2020   Procedure: HOT HEMOSTASIS (ARGON PLASMA COAGULATION/BICAP);  Surgeon: Carol Ada, MD;  [facility];  Service: Endoscopy;  Laterality: N/A;   HOT HEMOSTASIS N/A 12/18/2020   Procedure: HOT HEMOSTASIS (ARGON PLASMA COAGULATION/BICAP);  Surgeon: Carol Ada, MD;  [facility];  Service: Endoscopy;  Laterality: N/A;   IMPLANTABLE CARDIOVERTER DEFIBRILLATOR GENERATOR CHANGE  2008   INSERTION OF MESH N/A 07/09/2018   Procedure: INSERTION OF VICRYL MESH;  Surgeon: Judeth Horn, MD;  Location: [place];  Service: General;  Laterality: N/A;   LAPAROTOMY N/A 09/18/2017   Procedure:  EXPLORATORY LAPAROTOMY;  Surgeon: Judeth Horn, MD;  Location: [place];  Service: General;  Laterality: N/A;   LEAD REVISION  06/22/2018   LEAD REVISION/REPAIR N/A 06/22/2018   Procedure: LEAD REVISION/REPAIR;  Surgeon: Deboraha Sprang, MD;  Location: [place] [facility];  Service: Cardiovascular;  Laterality: N/A;   LYSIS OF ADHESION N/A 09/18/2017   Procedure: LYSIS OF ADHESION;  Surgeon: Judeth Horn, MD;  Location: [place];  Service: General;  Laterality: N/A;   LYSIS OF ADHESION N/A 07/09/2018   Procedure: LYSIS OF ADHESION;  Surgeon: Judeth Horn, MD;  Location: [place];  Service: General;  Laterality: N/A;   PARTIAL COLECTOMY N/A 09/18/2017   Procedure: ILEOCOLECTOMY;  Surgeon: Judeth Horn, MD;  Location: [place];  Service: General;  Laterality: N/A;  PARTIAL COLECTOMY  09/25/2017   POLYPECTOMY  03/13/2020   Procedure: POLYPECTOMY;  Surgeon: Carol Ada, MD;  [facility];  Service: Endoscopy;;   RIGHT/LEFT HEART CATH AND CORONARY ANGIOGRAPHY N/A 06/01/2018   Procedure: RIGHT/LEFT HEART CATH AND CORONARY ANGIOGRAPHY;  Surgeon: Jettie Booze, MD;  Location: [place] [facility];  Service: Cardiovascular;  Laterality: N/A;   SIGMOIDOSCOPY N/A 09/18/2017   Procedure: SIGMOIDOSCOPY;  Surgeon: Judeth Horn, MD;  Location: [place];  Service: General;  Laterality: N/A;   SMALL INTESTINE SURGERY  07/09/2018   EXPLORATION OF  ABDOMINAL WOUND ERAS PATHWAYN/; MESH; LYSIS OF ADHESIONS   TOTAL ABDOMINAL HYSTERECTOMY  1990   TRANSFORAMINAL LUMBAR INTERBODY FUSION (TLIF) WITH PEDICLE SCREW FIXATION 2 LEVEL N/A 01/07/2020   Procedure: Lumbar Four-Five and Lumbar Five-Sacral One open lumbar decompression and transforaminal lumbar interbody fusion;  Surgeon: Judith Part, MD;  Location: [place];  Service: Neurosurgery;  Laterality: N/A;  Lumbar Four-Five and Lumbar Five-Sacral One open lumbar decompression and transforaminal lumbar interbody fusion   TRIGGER FINGER RELEASE Right 05/213   TRIGGER  FINGER RELEASE Right 04/08/2019   Procedure: RIGHT INDEX RELEASE TRIGGER FINGER/A-1 PULLEY;  Surgeon: Milly Jakob, MD;  Location: [place];  Service: Orthopedics;  Laterality: Right;   TUBAL LIGATION  1980s   VESICOVAGINAL FISTULA CLOSURE W/ TAH     Patient Active Problem List   Diagnosis Date Noted   Hypermagnesemia 10/27/2021   Diabetes mellitus without complication ([place]) 41/96/2229   Hav (hallux abducto valgus), unspecified laterality 06/26/2020   Lumbar stenosis with neurogenic claudication 01/07/2020   Generalized abdominal pain    CKD (chronic kidney disease) stage 4, GFR 15-29 ml/min (HCC)    SBO (small bowel obstruction) ([place]) 08/17/2019   Hyponatremia 08/17/2019   COVID-19 virus infection 08/17/2019   Acute lower UTI 08/17/2019   Prolonged QT interval 08/17/2019   Acute hypoxemic respiratory failure due to COVID-19 ([place]) 08/07/2019   Normocytic anemia 08/07/2019   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   Congestive heart failure (CHF) ([place]) 79/89/2119   Acute systolic heart failure ([place])    Type 2 diabetes mellitus with stage 3 chronic kidney disease, with long-term current use of insulin ([place]) 11/22/2017   Acute respiratory failure with hypoxia ([place]) 11/22/2017   Bowel obstruction ([place]) 08/23/2017   Colonic obstruction ([place]) 08/22/2017   Dehydration    Intractable vomiting with nausea    Hyperkalemia    Gastroenteritis 08/11/2017   Hypotension 08/11/2017   Acute renal failure superimposed on stage 3 chronic kidney disease ([place]) 08/11/2017   Hypovolemia 08/10/2017   Colostomy in place ([place]) 07/13/2017   Adjustment disorder with depressed mood    Debilitated 02/06/2017   S/P colostomy ([place]) 01/31/2017   Pressure injury of skin 01/28/2017   Colitis 01/26/2017   Acute MI ([place]) 05/05/2014   Anemia, iron deficiency 05/05/2014   Airway hyperreactivity 05/05/2014   Diabetes mellitus, type 2 ([place]) 41/74/0814   Chronic systolic heart failure ([place]) 05/05/2014   HLD  (hyperlipidemia) 05/05/2014   Hypothyroidism 05/05/2014   Nonischemic cardiomyopathy ([place]) 02/02/2014   LBBB (left bundle branch block) 48/18/5631   Chronic systolic dysfunction of left ventricle 02/02/2014   Chronic renal insufficiency 02/02/2014   Essential hypertension 02/02/2014   Morbid obesity ([place]) 02/02/2014   Coronary artery disease 08/15/2013   Shock ([place]) 08/15/2013   History of prolonged Q-T interval on ECG 10/31/2012   Hx of cardiomyopathy 10/31/2012   Automatic implantable cardioverter-defibrillator in situ 08/06/2008    REFERRING DIAG: G57.02 (ICD-10-CM) -  Piriformis syndrome of RIGHT side   THERAPY DIAG:  Pain in right hip  Decreased functional mobility and endurance  Other low back pain  Rationale for Evaluation and Treatment Rehabilitation  ONSET DATE: about 1 month ago   SUBJECTIVE:    SUBJECTIVE STATEMENT: Pt reports   the TPDN was helpful decreasing her R gluteal pain. She notes she still feel pain in her lower R buttock. PAIN:  Are you having pain? Yes: NPRS scale: 5/10 with walking; 0/10 with Pain location: Rt buttock Pain description: knawing pain  Aggravating factors: pressure Relieving factors: heating pad , laying down    PRECAUTIONS: None   WEIGHT BEARING RESTRICTIONS: No   FALLS:  Has patient fallen in last 6 months? No   LIVING ENVIRONMENT: Lives with: lives with their family Lives in: House/apartment Stairs: Yes: Internal: 13 steps; on right going up often crawls up there vs walking  Has following equipment at home: Single point cane, Walker - 2 wheeled, and bed side commode   OCCUPATION: RN Retired   PLOF: Independent with basic ADLs and Independent with household mobility with device   PATIENT GOALS: Patient wants to relieve this pain .    NEXT MD VISIT: Next week for L knee.    OBJECTIVE: (objective measures completed at initial evaluation unless otherwise dated)    DIAGNOSTIC FINDINGS: none    PATIENT SURVEYS:  FOTO  31%   COGNITION: Overall cognitive status: Within functional limits for tasks assessed                         SENSATION: WFL   EDEMA:  NT   MUSCLE LENGTH: Hamstrings: Right tight, painful  deg; Left tight deg   POSTURE: rounded shoulders and flexed trunk    PALPATION: Patient with exquisite pain in the lower L 5 to S1, SIJ border extending laterally to outer hip.   LOWER EXTREMITY ROM:   Passive ROM Right eval Left eval  Hip flexion      Hip extension      Hip abduction      Hip adduction      Hip internal rotation 10-15 deg 15-20 deg   Hip external rotation good good  Knee flexion      Knee extension      Ankle dorsiflexion      Ankle plantarflexion      Ankle inversion      Ankle eversion       (Blank rows = not tested)   LOWER EXTREMITY MMT:   MMT Right eval Left eval  Hip flexion 4+/5 4/5  Hip extension      Hip abduction 3-/5 3-/5  Hip adduction      Hip internal rotation      Hip external rotation      Knee flexion 5/5 5/5  Knee extension 5/5 5/5  Ankle dorsiflexion      Ankle plantarflexion      Ankle inversion      Ankle eversion       (Blank rows = not tested)   LOWER EXTREMITY SPECIAL TESTS:  Hip special tests: NT due to pain    FUNCTIONAL TESTS:  5 times sit to stand: 24 sec  2 minute walk test: NT on eval 08/17/22=340"   GAIT: Distance walked: 150 Assistive device utilized: Walker - 2 wheeled Level of assistance: SBA  Comments: walker pushed far out from  upper body     TODAY'S TREATMENT: [facility] Adult  PT Treatment:                                                DATE: 08/19/22 Therapeutic Exercise: Supine Bridge c GTB lateral press 2 sets - 10 reps - 5 hold Hooklying Clamshell GTB 2 sets - 10 reps - 5 hold Hook lying banded marching 2x10 Supine Lower Trunk Rotation 5 reps - 10 hold Seated Hamstring Stretch 2 reps - 30 hold each Supine Piriformis Stretch  2 reps - 30 hold each Seated abdominal press x10 3" Manual  Therapy: STM/DTM to the R gluteal and piriformis muscles Skilled palpation to identify TrPs and taut muscle bands Trigger Point Dry Needling Treatment: Pre-treatment instruction: Patient instructed on dry needling rationale, procedures, and possible side effects including pain during treatment (achy,cramping feeling), bruising, drop of blood, lightheadedness, nausea, sweating. Patient Consent Given: Yes Education handout provided: Yes Muscles treated: R piriformis, Gluteal min. Gluteal med  Needle size and number: .30x18m x 1 Electrical stimulation performed: No Parameters: N/A Treatment response/outcome: Twitch response elicited Post-treatment instructions: Patient instructed to expect possible mild to moderate muscle soreness later today and/or tomorrow. Patient instructed in methods to reduce muscle soreness and to continue prescribed HEP. If patient was dry needled over the lung field, patient was instructed on signs and symptoms of pneumothorax and, however unlikely, to see immediate medical attention should they occur. Patient was also educated on signs and symptoms of infection and to seek medical attention should they occur. Patient verbalized understanding of these instructions and education.   [facility] Treatment:                                                DATE: 08/17/22 Therapeutic Exercise: Supine Bridge 2 sets - 10 reps - 5 hold Hooklying Clamshell GTB 2 sets - 10 reps - 5 hold Seated abdominal press x10 3" Supine Lower Trunk Rotation 5 reps - 10 hold Seated Hamstring Stretch 2 reps - 30 hold each Seated Piriformis Stretch  2 reps - 30 hold each Updated HEP Manual Therapy: DTM to the r piriformis and gluteal muscles Therapeutic Activity: 2MWT Self Care: Use of tennis ball for R gluteal massage  [facility] Adult PT Treatment:                                                DATE: 07/1922 Therapeutic Exercise: Supine Bridge 2 sets - 10 reps - 5 hold Hooklying Clamshell GTB  2 sets - 10 reps - 5 hold Supine Lower Trunk Rotation 5 reps - 10 hold Seated Hamstring Stretch 2 reps - 30 hold Seated Piriformis Stretch  2 reps - 20-30 hold Updated HEP Manual Therapy: TM/DTM to the R gluteal and piriformis muscles Skilled palpation to identify TrPs and taut muscle bands Trigger Point Dry Needling Treatment: Pre-treatment instruction: Patient instructed on dry needling rationale, procedures, and possible side effects including pain during treatment (achy,cramping feeling), bruising, drop of blood, lightheadedness, nausea, sweating. Patient Consent Given: Yes Education handout provided: Yes Muscles treated: R piriformis, Gluteal min. Gluteal med  Needle size and number: .30x786m  x 1 Electrical stimulation performed: No Parameters: N/A Treatment response/outcome: Twitch response elicited Post-treatment instructions: Patient instructed to expect possible mild to moderate muscle soreness later today and/or tomorrow. Patient instructed in methods to reduce muscle soreness and to continue prescribed HEP. If patient was dry needled over the lung field, patient was instructed on signs and symptoms of pneumothorax and, however unlikely, to see immediate medical attention should they occur. Patient was also educated on signs and symptoms of infection and to seek medical attention should they occur. Patient verbalized understanding of these instructions and education.                                                                                                                                 PATIENT EDUCATION:  Education details: POC, HEP, differential diagnosis  Person educated: Patient Education method: Customer service manager Education comprehension: verbalized understanding   HOME EXERCISE PROGRAM: Access Code: [facility] URL: [email or web address] Date: 08/17/2022 Prepared by: Gar Ponto  Exercises - Supine Lower Trunk Rotation  - 1 x daily - 7 x  weekly - 2 sets - 10 reps - 10 hold - Seated Hamstring Stretch  - 1 x daily - 7 x weekly - 1 sets - 3 reps - 30 hold - Seated Piriformis Stretch  - 1 x daily - 7 x weekly - 1 sets - 3 reps - 20-30 hold - Standing Hip Flexor Stretch  - 1 x daily - 7 x weekly - 1 sets - 3 reps - 20-30 hold - Bridge with Hip Abduction and Resistance  - 1 x daily - 7 x weekly - 2 sets - 10 reps - 5 hold - Clamshell with Resistance  - 1 x daily - 7 x weekly - 2 sets - 10 reps - 5 hold - Seated Abdominal Press into [employer]  - 1 x daily - 7 x weekly - 2 sets - 10 reps - 2 hold   ASSESSMENT:   CLINICAL IMPRESSION: PT was completed for manual therapy to the R piriformis and gluteals (min, med and max) f/b TPDN. Therex for flexibility and strengthening for muscle activation was then completed. Pt reported a decrease in R gluteal pain with treatment today. Gait pattern is improved with better pace and without an antalgic limp over the R LE. Pt demonstrates symmetrical sitting with equal wt bearing. Pt's pain and function are improving with PT intervention.   OBJECTIVE IMPAIRMENTS: cardiopulmonary status limiting activity, decreased activity tolerance, decreased endurance, decreased mobility, difficulty walking, decreased ROM, decreased strength, increased fascial restrictions, postural dysfunction, obesity, and pain.    ACTIVITY LIMITATIONS: carrying, lifting, bending, sitting, standing, squatting, sleeping, stairs, transfers, bed mobility, and locomotion level   PARTICIPATION LIMITATIONS: shopping and community activity   PERSONAL FACTORS: Age, Fitness, and 3+ comorbidities: Polyarthralgia, obesity, defibrillator, diabetes  are also affecting patient's functional outcome.    REHAB POTENTIAL: Good   CLINICAL DECISION MAKING: Stable/uncomplicated   EVALUATION COMPLEXITY:  Low     GOALS: Goals reviewed with patient? Yes   SHORT TERM GOALS: Target date: 09/06/2022   Patient will be independent in initial home  exercise program Baseline: Goal status: Ongoing   2.  Patient will be able to sit more symmetrically on the right hip to develop normal movement patterns Baseline:  Goal status: Improved   3.  Patient will complete functional testing goals set (2-minute walk, TUG) Baseline:  Goal status: MET   4.  Patient will begin to tolerate standing for up to 10 minutes to positively impact ADLs and quality of life Baseline:  Goal status: INITIAL     LONG TERM GOALS: Target date: 10/04/2022     Patient will improve FOTO score to 50% or greater to demonstrate improved functional mobility Baseline: 31% Goal status: INITIAL   2.  Patient will perform bed mobility without increased back and hip pain >50% of the time  Baseline:  Goal status: INITIAL   3.  Patient will be able to walk up stairs with modified independence using 1 rail, preferred pattern Baseline: often uses hands on step above to crawl  Goal status: INITIAL   4.  2 min walk test TBA /other goals  Baseline:  Goal status: INITIAL   5.  Patient will be independent with home exercise program upon discharge Baseline:  Goal status: INITIAL       PLAN:   PT FREQUENCY: 2x/week   PT DURATION: 8 weeks   PLANNED INTERVENTIONS: Therapeutic exercises, Therapeutic activity, Neuromuscular re-education, Balance training, Gait training, Patient/Family education, Self Care, Joint mobilization, Stair training, Aquatic Therapy, Cryotherapy, Moist heat, Ionotophoresis '4mg'$ /ml Dexamethasone, Manual therapy, and Re-evaluation   PLAN FOR NEXT SESSION: 2-minute walk test.  NuStep.  Check home exercise program.  Consider manual therapy in side-lying to right hip/trunk    Gar Ponto MS, PT 08/19/22 11:31 AM

## 2022-08-23 ENCOUNTER — Ambulatory Visit: Payer: Medicare Other

## 2022-08-23 DIAGNOSIS — M5459 Other low back pain: Secondary | ICD-10-CM

## 2022-08-23 DIAGNOSIS — M25551 Pain in right hip: Secondary | ICD-10-CM | POA: Diagnosis not present

## 2022-08-23 DIAGNOSIS — G5702 Lesion of sciatic nerve, left lower limb: Secondary | ICD-10-CM | POA: Diagnosis not present

## 2022-08-23 DIAGNOSIS — Z7409 Other reduced mobility: Secondary | ICD-10-CM | POA: Diagnosis not present

## 2022-08-23 NOTE — Therapy (Signed)
OUTPATIENT PHYSICAL THERAPY TREATMENT NOTE   Patient Name: Crystal [name] MRN: [id number] DOB:1950/11/05, 72 y.o., female Today's Date: 08/23/2022  PCP: Crist Infante MD  REFERRING PROVIDER: Alysia Penna MD    END OF SESSION:   PT End of Session - 08/23/22 1149     Visit Number 5    Number of Visits 16    Date for PT Re-Evaluation 10/04/22    Authorization Type Medicare    PT Start Time 1148    PT Stop Time 2951    PT Time Calculation (min) 45 min    Activity Tolerance Patient tolerated treatment well    Behavior During Therapy [facility] for tasks assessed/performed              Past Medical History:  Diagnosis Date   AICD (automatic cardioverter/defibrillator) present    high RV threshold chronically, device was turned off in 2014; Device battery has been dead x 7 years; "turned it back on 06/22/2018"   Anemia, iron deficiency    Angioedema    felt to likely be due to ace inhibitors but says she has had this even off of medicines, appears to be tolerating ARBs chronically   Anxiety    Arthritis    "hands, legs, arms; bad in my back" (06/22/2018)   Asthmatic bronchitis with status asthmaticus    Bradycardia    CHF (congestive heart failure) (HCC)    Chronic lower back pain    Chronic pain    CKD (chronic kidney disease), stage III ([place])    Coronary artery disease    Mild nonobstructive (30% LAD, 30% RCA) by 09/01/16 cath at [facility]   Degenerative joint disease    Depression    Diabetes mellitus, type 2 ([place])    Diabetic peripheral neuropathy ([place])    Dizziness    Dyspnea on exertion    Family history of adverse reaction to anesthesia    Mother has nausea   GERD (gastroesophageal reflux disease)    Gout    Heart valve disorder    History of blood transfusion 1981; ~ 2005; 12/2016   "childbirth; defibrillator OR; colostomy OR"   History of mononucleosis 03/2014   Hyperlipidemia    Hypertension    Hypothyroid    Insomnia    Intervertebral disc  degeneration    Ischemic cardiomyopathy    LBBB (left bundle branch block)    Myocardial infarction ([place]) dx'd ~ 2005   Nonischemic cardiomyopathy ([place])    s/p ICD in 2005. EF has since recovered   Obesity    On home oxygen therapy    "2L at night" (06/22/2018)   OSA on CPAP    Pneumonia    "couple times; last time was 12/2016" (06/22/2018)   PONV (postoperative nausea and vomiting)    Presence of permanent cardiac pacemaker    [employer]   Swelling    Syncope    Systemic hypertension    Vitamin D deficiency    Past Surgical History:  Procedure Laterality Date   APPENDECTOMY  07/13/2017   BLADDER SUSPENSION  1990   "w/hysterectomy"   BOWEL RESECTION N/A 07/09/2018   Procedure: SMALL BOWEL RESECTION;  Surgeon: Judeth Horn, MD;  Location: [place];  Service: General;  Laterality: N/A;   CARDIAC CATHETERIZATION  2005   no obstructive CAD per patient   CARDIAC CATHETERIZATION  2018   CARDIAC DEFIBRILLATOR PLACEMENT  2005   BiV ICD implanted,  LV lead is an epicardial lead   CARPAL  TUNNEL RELEASE Left 07/25/2014   Dr.Williamson    CARPAL TUNNEL RELEASE Right 04/08/2019   Procedure: RIGHT CARPAL TUNNEL RELEASE ENDOSCOPIC;  Surgeon: Milly Jakob, MD;  Location: [place];  Service: Orthopedics;  Laterality: Right;   CATARACT EXTRACTION W/ INTRAOCULAR LENS  IMPLANT, BILATERAL Bilateral    COLONIC STENT PLACEMENT N/A 08/31/2017   Procedure: COLONIC STENT PLACEMENT;  Surgeon: Carol Ada, MD;  Location: [place];  Service: Endoscopy;  Laterality: N/A;   COLONOSCOPY     2010-2011 Dr.Kipreos    COLOSTOMY  12/2016   Archie Endo 01/26/2017   COLOSTOMY REVERSAL N/A 07/13/2017   Procedure: COLOSTOMY REVERSAL;  Surgeon: Judeth Horn, MD;  Location: [place];  Service: General;  Laterality: N/A;   CYST REMOVAL HAND Right 05/2013   thumb   DILATION AND CURETTAGE OF UTERUS  X 5-6   ESOPHAGOGASTRODUODENOSCOPY (EGD) WITH PROPOFOL N/A 03/13/2020   Procedure: ESOPHAGOGASTRODUODENOSCOPY (EGD) WITH  PROPOFOL;  Surgeon: Carol Ada, MD;  [facility];  Service: Endoscopy;  Laterality: N/A;   EXCISION MASS ABDOMINAL N/A 07/09/2018   Procedure: EXPLORATION OF  ABDOMINAL WOUND ERAS PATHWAY;  Surgeon: Judeth Horn, MD;  Location: [place];  Service: General;  Laterality: N/A;   FLEXIBLE SIGMOIDOSCOPY N/A 08/24/2017   Procedure: Beryle Quant;  Surgeon: Carol Ada, MD;  Location: [place];  Service: Endoscopy;  Laterality: N/A;   FLEXIBLE SIGMOIDOSCOPY N/A 08/31/2017   Procedure: FLEXIBLE SIGMOIDOSCOPY;  Surgeon: Carol Ada, MD;  Location: [place];  Service: Endoscopy;  Laterality: N/A;  stent placement   FLEXIBLE SIGMOIDOSCOPY N/A 03/13/2020   Procedure: FLEXIBLE SIGMOIDOSCOPY;  Surgeon: Carol Ada, MD;  [facility];  Service: Endoscopy;  Laterality: N/A;   FLEXIBLE SIGMOIDOSCOPY N/A 12/18/2020   Procedure: FLEXIBLE SIGMOIDOSCOPY;  Surgeon: Carol Ada, MD;  [facility];  Service: Endoscopy;  Laterality: N/A;   HEMOSTASIS CLIP PLACEMENT  03/13/2020   Procedure: HEMOSTASIS CLIP PLACEMENT;  Surgeon: Carol Ada, MD;  [facility];  Service: Endoscopy;;   HEMOSTASIS CLIP PLACEMENT  12/18/2020   Procedure: HEMOSTASIS CLIP PLACEMENT;  Surgeon: Carol Ada, MD;  [facility];  Service: Endoscopy;;   HOT HEMOSTASIS N/A 03/13/2020   Procedure: HOT HEMOSTASIS (ARGON PLASMA COAGULATION/BICAP);  Surgeon: Carol Ada, MD;  [facility];  Service: Endoscopy;  Laterality: N/A;   HOT HEMOSTASIS N/A 12/18/2020   Procedure: HOT HEMOSTASIS (ARGON PLASMA COAGULATION/BICAP);  Surgeon: Carol Ada, MD;  Location[facility];  Service: Endoscopy;  Laterality: N/A;   IMPLANTABLE CARDIOVERTER DEFIBRILLATOR GENERATOR CHANGE  2008   INSERTION OF MESH N/A 07/09/2018   Procedure: INSERTION OF VICRYL MESH;  Surgeon: Judeth Horn, MD;  Location: [place];  Service: General;  Laterality: N/A;   LAPAROTOMY N/A 09/18/2017   Procedure:  EXPLORATORY LAPAROTOMY;  Surgeon: Judeth Horn, MD;  Location: [place];  Service: General;  Laterality: N/A;   LEAD REVISION  06/22/2018   LEAD REVISION/REPAIR N/A 06/22/2018   Procedure: LEAD REVISION/REPAIR;  Surgeon: Deboraha Sprang, MD;  Location: [place] [facility];  Service: Cardiovascular;  Laterality: N/A;   LYSIS OF ADHESION N/A 09/18/2017   Procedure: LYSIS OF ADHESION;  Surgeon: Judeth Horn, MD;  Location: [place];  Service: General;  Laterality: N/A;   LYSIS OF ADHESION N/A 07/09/2018   Procedure: LYSIS OF ADHESION;  Surgeon: Judeth Horn, MD;  Location: [place];  Service: General;  Laterality: N/A;   PARTIAL COLECTOMY N/A 09/18/2017   Procedure: ILEOCOLECTOMY;  Surgeon: Judeth Horn, MD;  Location: [place];  Service: General;  Laterality:  N/A;   PARTIAL COLECTOMY  09/25/2017   POLYPECTOMY  03/13/2020   Procedure: POLYPECTOMY;  Surgeon: Carol Ada, MD;  [facility];  Service: Endoscopy;;   RIGHT/LEFT HEART CATH AND CORONARY ANGIOGRAPHY N/A 06/01/2018   Procedure: RIGHT/LEFT HEART CATH AND CORONARY ANGIOGRAPHY;  Surgeon: Jettie Booze, MD;  Location: [place] [facility];  Service: Cardiovascular;  Laterality: N/A;   SIGMOIDOSCOPY N/A 09/18/2017   Procedure: SIGMOIDOSCOPY;  Surgeon: Judeth Horn, MD;  Location: [place];  Service: General;  Laterality: N/A;   SMALL INTESTINE SURGERY  07/09/2018   EXPLORATION OF  ABDOMINAL WOUND ERAS PATHWAYN/; MESH; LYSIS OF ADHESIONS   TOTAL ABDOMINAL HYSTERECTOMY  1990   TRANSFORAMINAL LUMBAR INTERBODY FUSION (TLIF) WITH PEDICLE SCREW FIXATION 2 LEVEL N/A 01/07/2020   Procedure: Lumbar Four-Five and Lumbar Five-Sacral One open lumbar decompression and transforaminal lumbar interbody fusion;  Surgeon: Judith Part, MD;  Location: [place];  Service: Neurosurgery;  Laterality: N/A;  Lumbar Four-Five and Lumbar Five-Sacral One open lumbar decompression and transforaminal lumbar interbody fusion   TRIGGER FINGER RELEASE Right 05/213   TRIGGER  FINGER RELEASE Right 04/08/2019   Procedure: RIGHT INDEX RELEASE TRIGGER FINGER/A-1 PULLEY;  Surgeon: Milly Jakob, MD;  Location: [place];  Service: Orthopedics;  Laterality: Right;   TUBAL LIGATION  1980s   VESICOVAGINAL FISTULA CLOSURE W/ TAH     Patient Active Problem List   Diagnosis Date Noted   Hypermagnesemia 10/27/2021   Diabetes mellitus without complication ([place]) 57/84/6962   Hav (hallux abducto valgus), unspecified laterality 06/26/2020   Lumbar stenosis with neurogenic claudication 01/07/2020   Generalized abdominal pain    CKD (chronic kidney disease) stage 4, GFR 15-29 ml/min (HCC)    SBO (small bowel obstruction) ([place]) 08/17/2019   Hyponatremia 08/17/2019   COVID-19 virus infection 08/17/2019   Acute lower UTI 08/17/2019   Prolonged QT interval 08/17/2019   Acute hypoxemic respiratory failure due to COVID-19 ([place]) 08/07/2019   Normocytic anemia 08/07/2019   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   Congestive heart failure (CHF) ([place]) 95/28/4132   Acute systolic heart failure ([place])    Type 2 diabetes mellitus with stage 3 chronic kidney disease, with long-term current use of insulin ([place]) 11/22/2017   Acute respiratory failure with hypoxia ([place]) 11/22/2017   Bowel obstruction ([place]) 08/23/2017   Colonic obstruction ([place]) 08/22/2017   Dehydration    Intractable vomiting with nausea    Hyperkalemia    Gastroenteritis 08/11/2017   Hypotension 08/11/2017   Acute renal failure superimposed on stage 3 chronic kidney disease ([place]) 08/11/2017   Hypovolemia 08/10/2017   Colostomy in place ([place]) 07/13/2017   Adjustment disorder with depressed mood    Debilitated 02/06/2017   S/P colostomy ([place]) 01/31/2017   Pressure injury of skin 01/28/2017   Colitis 01/26/2017   Acute MI ([place]) 05/05/2014   Anemia, iron deficiency 05/05/2014   Airway hyperreactivity 05/05/2014   Diabetes mellitus, type 2 ([place]) 44/07/270   Chronic systolic heart failure ([place]) 05/05/2014   HLD  (hyperlipidemia) 05/05/2014   Hypothyroidism 05/05/2014   Nonischemic cardiomyopathy ([place]) 02/02/2014   LBBB (left bundle branch block) 53/66/4403   Chronic systolic dysfunction of left ventricle 02/02/2014   Chronic renal insufficiency 02/02/2014   Essential hypertension 02/02/2014   Morbid obesity ([place]) 02/02/2014   Coronary artery disease 08/15/2013   Shock ([place]) 08/15/2013   History of prolonged Q-T interval on ECG 10/31/2012   Hx of cardiomyopathy 10/31/2012   Automatic implantable cardioverter-defibrillator in situ 08/06/2008    REFERRING DIAG:  G57.02 (ICD-10-CM) - Piriformis syndrome of RIGHT side   THERAPY DIAG:  Pain in right hip  Decreased functional mobility and endurance  Other low back pain  Rationale for Evaluation and Treatment Rehabilitation  ONSET DATE: about 1 month ago   SUBJECTIVE:    SUBJECTIVE STATEMENT: Pt reports her hip pain is much better. Pt reports tolerating standing longer and turning in bed more easily.  PAIN:  Are you having pain? Yes: NPRS scale: 3-4/10 with walking; 0/10 with Pain location: Rt buttock Pain description: gnawing pain  Aggravating factors: pressure Relieving factors: heating pad , laying down    PRECAUTIONS: None   WEIGHT BEARING RESTRICTIONS: No   FALLS:  Has patient fallen in last 6 months? No   LIVING ENVIRONMENT: Lives with: lives with their family Lives in: House/apartment Stairs: Yes: Internal: 13 steps; on right going up often crawls up there vs walking  Has following equipment at home: Single point cane, Walker - 2 wheeled, and bed side commode   OCCUPATION: RN Retired   PLOF: Independent with basic ADLs and Independent with household mobility with device   PATIENT GOALS: Patient wants to relieve this pain .    NEXT MD VISIT: Next week for L knee.    OBJECTIVE: (objective measures completed at initial evaluation unless otherwise dated)    DIAGNOSTIC FINDINGS: none    PATIENT SURVEYS:  FOTO 31%.  08/23/22=47%   COGNITION: Overall cognitive status: Within functional limits for tasks assessed                         SENSATION: WFL   EDEMA:  NT   MUSCLE LENGTH: Hamstrings: Right tight, painful  deg; Left tight deg   POSTURE: rounded shoulders and flexed trunk    PALPATION: Patient with exquisite pain in the lower L 5 to S1, SIJ border extending laterally to outer hip.   LOWER EXTREMITY ROM:   Passive ROM Right eval Left eval  Hip flexion      Hip extension      Hip abduction      Hip adduction      Hip internal rotation 10-15 deg 15-20 deg   Hip external rotation good good  Knee flexion      Knee extension      Ankle dorsiflexion      Ankle plantarflexion      Ankle inversion      Ankle eversion       (Blank rows = not tested)   LOWER EXTREMITY MMT:   MMT Right eval Left eval  Hip flexion 4+/5 4/5  Hip extension      Hip abduction 3-/5 3-/5  Hip adduction      Hip internal rotation      Hip external rotation      Knee flexion 5/5 5/5  Knee extension 5/5 5/5  Ankle dorsiflexion      Ankle plantarflexion      Ankle inversion      Ankle eversion       (Blank rows = not tested)   LOWER EXTREMITY SPECIAL TESTS:  Hip special tests: NT due to pain    FUNCTIONAL TESTS:  5 times sit to stand: 24 sec  2 minute walk test: NT on eval 08/17/22=340"   GAIT: Distance walked: 150 Assistive device utilized: Walker - 2 wheeled Level of assistance: SBA  Comments: walker pushed far out from  upper body     TODAY'S TREATMENT: [facility]  PT Treatment:                                                DATE: 08/23/22 Therapeutic Exercise: Nustep 5 mins L6 UE/LE Seated Hamstring Stretch 2 reps - 30 hold each Seated Piriformis Stretch  2 reps - 30 hold each Seated abdominal press x10 3" Supine Lower Trunk Rotation 5 reps - 5 hold Supine Bridge c GTB lateral press 2 sets - 10 reps - 5 hold Hooklying Clamshell GTB 2 sets - 10 reps - 5 hold Hook lying banded  marching 2x10 SLR c quad set x15 each SL Hip abd x15 each  [facility] Adult PT Treatment:                                                DATE: 08/19/22 Therapeutic Exercise: Supine Bridge c GTB lateral press 2 sets - 10 reps - 5 hold Hooklying Clamshell GTB 2 sets - 10 reps - 5 hold Hook lying banded marching 2x10 Supine Lower Trunk Rotation 5 reps - 10 hold Seated Hamstring Stretch 2 reps - 30 hold each Supine Piriformis Stretch  2 reps - 30 hold each Seated abdominal press x10 3" Manual Therapy: STM/DTM to the R gluteal and piriformis muscles Skilled palpation to identify TrPs and taut muscle bands Trigger Point Dry Needling Treatment: Pre-treatment instruction: Patient instructed on dry needling rationale, procedures, and possible side effects including pain during treatment (achy,cramping feeling), bruising, drop of blood, lightheadedness, nausea, sweating. Patient Consent Given: Yes Education handout provided: Yes Muscles treated: R piriformis, Gluteal min. Gluteal med  Needle size and number: .30x16m x 1 Electrical stimulation performed: No Parameters: N/A Treatment response/outcome: Twitch response elicited Post-treatment instructions: Patient instructed to expect possible mild to moderate muscle soreness later today and/or tomorrow. Patient instructed in methods to reduce muscle soreness and to continue prescribed HEP. If patient was dry needled over the lung field, patient was instructed on signs and symptoms of pneumothorax and, however unlikely, to see immediate medical attention should they occur. Patient was also educated on signs and symptoms of infection and to seek medical attention should they occur. Patient verbalized understanding of these instructions and education.   [facility]                                                DATE: 08/17/22 Therapeutic Exercise: Supine Bridge 2 sets - 10 reps - 5 hold Hooklying Clamshell GTB 2 sets - 10 reps - 5 hold Seated  abdominal press x10 3" Supine Lower Trunk Rotation 5 reps - 10 hold Seated Hamstring Stretch 2 reps - 30 hold each Seated Piriformis Stretch  2 reps - 30 hold each Updated HEP Manual Therapy: DTM to the r piriformis and gluteal muscles Therapeutic Activity: 2MWT Self Care: Use of tennis ball for R gluteal massage  PATIENT EDUCATION:  Education details: POC, HEP, differential diagnosis  Person educated: Patient Education method: Customer service manager Education comprehension: verbalized understanding   HOME EXERCISE PROGRAM: Access Code: [facility] URL: [email or web address] Date: 08/23/2022 Prepared by: Gar Ponto  Exercises - Supine Lower Trunk Rotation  - 1 x daily - 7 x weekly - 2 sets - 10 reps - 10 hold - Seated Hamstring Stretch  - 1 x daily - 7 x weekly - 1 sets - 3 reps - 30 hold - Seated Piriformis Stretch  - 1 x daily - 7 x weekly - 1 sets - 3 reps - 20-30 hold - Standing Hip Flexor Stretch  - 1 x daily - 7 x weekly - 1 sets - 3 reps - 20-30 hold - Bridge with Hip Abduction and Resistance  - 1 x daily - 7 x weekly - 2 sets - 10 reps - 5 hold - Clamshell with Resistance  - 1 x daily - 7 x weekly - 2 sets - 10 reps - 5 hold - Sidelying Hip Abduction  - 1 x daily - 7 x weekly - 2 sets - 10 reps - 5 hold - Active Straight Leg Raise with Quad Set  - 1 x daily - 7 x weekly - 2 sets - 10 reps - 5 hold   ASSESSMENT:   CLINICAL IMPRESSION: Pt is making appropriate progress re: pain and reported function which correlates with pt's FOTO score. PT was completed for lumbopelvic/LE flexibility and strengthening with strengthening at a greater demand. Pt tolerated PT today without adverse effects.    OBJECTIVE IMPAIRMENTS: cardiopulmonary status limiting activity, decreased activity tolerance, decreased endurance, decreased mobility,  difficulty walking, decreased ROM, decreased strength, increased fascial restrictions, postural dysfunction, obesity, and pain.    ACTIVITY LIMITATIONS: carrying, lifting, bending, sitting, standing, squatting, sleeping, stairs, transfers, bed mobility, and locomotion level   PARTICIPATION LIMITATIONS: shopping and community activity   PERSONAL FACTORS: Age, Fitness, and 3+ comorbidities: Polyarthralgia, obesity, defibrillator, diabetes  are also affecting patient's functional outcome.    REHAB POTENTIAL: Good   CLINICAL DECISION MAKING: Stable/uncomplicated   EVALUATION COMPLEXITY: Low     GOALS: Goals reviewed with patient? Yes   SHORT TERM GOALS: Target date: 09/06/2022   Patient will be independent in initial home exercise program Baseline: Goal status:  MET   2.  Patient will be able to sit more symmetrically on the right hip to develop normal movement patterns Baseline:  Goal status: MET   3.  Patient will complete functional testing goals set (2-minute walk, TUG) Baseline:  Goal status: MET   4.  Patient will begin to tolerate standing for up to 10 minutes to positively impact ADLs and quality of life Baseline:  Status: 08/23/22=pt estimates 10 mins Goal status: MET    LONG TERM GOALS: Target date: 10/04/2022     Patient will improve FOTO score to 50% or greater to demonstrate improved functional mobility Baseline: 31% Goal status: INITIAL   2.  Patient will perform bed mobility without increased back and hip pain >50% of the time  Baseline:  Goal status: INITIAL   3.  Patient will be able to walk up stairs with modified independence using 1 rail, preferred pattern Baseline: often uses hands on step above to crawl  Goal status: INITIAL   4.  2 min walk test TBA /other goals  Baseline:  Goal status: INITIAL   5.  Patient will be independent with home exercise program upon discharge Baseline:  Goal  status: INITIAL       PLAN:   PT FREQUENCY: 2x/week    PT DURATION: 8 weeks   PLANNED INTERVENTIONS: Therapeutic exercises, Therapeutic activity, Neuromuscular re-education, Balance training, Gait training, Patient/Family education, Self Care, Joint mobilization, Stair training, Aquatic Therapy, Cryotherapy, Moist heat, Ionotophoresis '4mg'$ /ml Dexamethasone, Manual therapy, and Re-evaluation   PLAN FOR NEXT SESSION: 2-minute walk test.  NuStep.  Check home exercise program.  Consider manual therapy in side-lying to right hip/trunk    Gar Ponto MS, PT 08/23/22 1:21 PM

## 2022-08-25 ENCOUNTER — Ambulatory Visit: Payer: Medicare Other | Attending: Internal Medicine

## 2022-08-25 DIAGNOSIS — M5459 Other low back pain: Secondary | ICD-10-CM | POA: Diagnosis not present

## 2022-08-25 DIAGNOSIS — Z7409 Other reduced mobility: Secondary | ICD-10-CM

## 2022-08-25 DIAGNOSIS — M25551 Pain in right hip: Secondary | ICD-10-CM | POA: Diagnosis not present

## 2022-08-25 NOTE — Therapy (Signed)
OUTPATIENT PHYSICAL THERAPY TREATMENT NOTE   Patient Name: Crystal [name] MRN: [id number] DOB:Jun 20, 1951, 72 y.o., female Today's Date: 08/25/2022  PCP: Crist Infante MD  REFERRING PROVIDER: Alysia Penna MD    END OF SESSION:   PT End of Session - 08/25/22 1110     Visit Number 6    Number of Visits 16    Date for PT Re-Evaluation 10/04/22    Authorization Type Medicare    PT Start Time 1108    PT Stop Time 1151    PT Time Calculation (min) 43 min    Activity Tolerance Patient tolerated treatment well    Behavior During Therapy WFL for tasks assessed/performed               Past Medical History:  Diagnosis Date   AICD (automatic cardioverter/defibrillator) present    high RV threshold chronically, device was turned off in 2014; Device battery has been dead x 7 years; "turned it back on 06/22/2018"   Anemia, iron deficiency    Angioedema    felt to likely be due to ace inhibitors but says she has had this even off of medicines, appears to be tolerating ARBs chronically   Anxiety    Arthritis    "hands, legs, arms; bad in my back" (06/22/2018)   Asthmatic bronchitis with status asthmaticus    Bradycardia    CHF (congestive heart failure) (HCC)    Chronic lower back pain    Chronic pain    CKD (chronic kidney disease), stage III ([place])    Coronary artery disease    Mild nonobstructive (30% LAD, 30% RCA) by 09/01/16 cath at [facility]   Degenerative joint disease    Depression    Diabetes mellitus, type 2 ([place])    Diabetic peripheral neuropathy ([place])    Dizziness    Dyspnea on exertion    Family history of adverse reaction to anesthesia    Mother has nausea   GERD (gastroesophageal reflux disease)    Gout    Heart valve disorder    History of blood transfusion 1981; ~ 2005; 12/2016   "childbirth; defibrillator OR; colostomy OR"   History of mononucleosis 03/2014   Hyperlipidemia    Hypertension    Hypothyroid    Insomnia    Intervertebral disc  degeneration    Ischemic cardiomyopathy    LBBB (left bundle branch block)    Myocardial infarction ([place]) dx'd ~ 2005   Nonischemic cardiomyopathy ([place])    s/p ICD in 2005. EF has since recovered   Obesity    On home oxygen therapy    "2L at night" (06/22/2018)   OSA on CPAP    Pneumonia    "couple times; last time was 12/2016" (06/22/2018)   PONV (postoperative nausea and vomiting)    Presence of permanent cardiac pacemaker    [employer]   Swelling    Syncope    Systemic hypertension    Vitamin D deficiency    Past Surgical History:  Procedure Laterality Date   APPENDECTOMY  07/13/2017   BLADDER SUSPENSION  1990   "w/hysterectomy"   BOWEL RESECTION N/A 07/09/2018   Procedure: SMALL BOWEL RESECTION;  Surgeon: Judeth Horn, MD;  Location: [place];  Service: General;  Laterality: N/A;   CARDIAC CATHETERIZATION  2005   no obstructive CAD per patient   CARDIAC CATHETERIZATION  2018   CARDIAC DEFIBRILLATOR PLACEMENT  2005   BiV ICD implanted,  LV lead is an epicardial lead  CARPAL TUNNEL RELEASE Left 07/25/2014   Dr.Williamson    CARPAL TUNNEL RELEASE Right 04/08/2019   Procedure: RIGHT CARPAL TUNNEL RELEASE ENDOSCOPIC;  Surgeon: Milly Jakob, MD;  Location: [place];  Service: Orthopedics;  Laterality: Right;   CATARACT EXTRACTION W/ INTRAOCULAR LENS  IMPLANT, BILATERAL Bilateral    COLONIC STENT PLACEMENT N/A 08/31/2017   Procedure: COLONIC STENT PLACEMENT;  Surgeon: Carol Ada, MD;  Location: [place];  Service: Endoscopy;  Laterality: N/A;   COLONOSCOPY     2010-2011 Dr.Kipreos    COLOSTOMY  12/2016   Archie Endo 01/26/2017   COLOSTOMY REVERSAL N/A 07/13/2017   Procedure: COLOSTOMY REVERSAL;  Surgeon: Judeth Horn, MD;  Location: [place];  Service: General;  Laterality: N/A;   CYST REMOVAL HAND Right 05/2013   thumb   DILATION AND CURETTAGE OF UTERUS  X 5-6   ESOPHAGOGASTRODUODENOSCOPY (EGD) WITH PROPOFOL N/A 03/13/2020   Procedure: ESOPHAGOGASTRODUODENOSCOPY (EGD) WITH  PROPOFOL;  Surgeon: Carol Ada, MD;  [facility];  Service: Endoscopy;  Laterality: N/A;   EXCISION MASS ABDOMINAL N/A 07/09/2018   Procedure: EXPLORATION OF  ABDOMINAL WOUND ERAS PATHWAY;  Surgeon: Judeth Horn, MD;  Location: [place];  Service: General;  Laterality: N/A;   FLEXIBLE SIGMOIDOSCOPY N/A 08/24/2017   Procedure: Beryle Quant;  Surgeon: Carol Ada, MD;  Location: [place];  Service: Endoscopy;  Laterality: N/A;   FLEXIBLE SIGMOIDOSCOPY N/A 08/31/2017   Procedure: FLEXIBLE SIGMOIDOSCOPY;  Surgeon: Carol Ada, MD;  Location: [place];  Service: Endoscopy;  Laterality: N/A;  stent placement   FLEXIBLE SIGMOIDOSCOPY N/A 03/13/2020   Procedure: FLEXIBLE SIGMOIDOSCOPY;  Surgeon: Carol Ada, MD;  [facility];  Service: Endoscopy;  Laterality: N/A;   FLEXIBLE SIGMOIDOSCOPY N/A 12/18/2020   Procedure: FLEXIBLE SIGMOIDOSCOPY;  Surgeon: Carol Ada, MD;  [facility];  Service: Endoscopy;  Laterality: N/A;   HEMOSTASIS CLIP PLACEMENT  03/13/2020   Procedure: HEMOSTASIS CLIP PLACEMENT;  Surgeon: Carol Ada, MD;  [facility];  Service: Endoscopy;;   HEMOSTASIS CLIP PLACEMENT  12/18/2020   Procedure: HEMOSTASIS CLIP PLACEMENT;  Surgeon: Carol Ada, MD;  [facility];  Service: Endoscopy;;   HOT HEMOSTASIS N/A 03/13/2020   Procedure: HOT HEMOSTASIS (ARGON PLASMA COAGULATION/BICAP);  Surgeon: Carol Ada, MD;  [facility];  Service: Endoscopy;  Laterality: N/A;   HOT HEMOSTASIS N/A 12/18/2020   Procedure: HOT HEMOSTASIS (ARGON PLASMA COAGULATION/BICAP);  Surgeon: Carol Ada, MD;  Location[facility];  Service: Endoscopy;  Laterality: N/A;   IMPLANTABLE CARDIOVERTER DEFIBRILLATOR GENERATOR CHANGE  2008   INSERTION OF MESH N/A 07/09/2018   Procedure: INSERTION OF VICRYL MESH;  Surgeon: Judeth Horn, MD;  Location: [place];  Service: General;  Laterality: N/A;   LAPAROTOMY N/A 09/18/2017   Procedure:  EXPLORATORY LAPAROTOMY;  Surgeon: Judeth Horn, MD;  Location: [place];  Service: General;  Laterality: N/A;   LEAD REVISION  06/22/2018   LEAD REVISION/REPAIR N/A 06/22/2018   Procedure: LEAD REVISION/REPAIR;  Surgeon: Deboraha Sprang, MD;  Location: [place] [facility];  Service: Cardiovascular;  Laterality: N/A;   LYSIS OF ADHESION N/A 09/18/2017   Procedure: LYSIS OF ADHESION;  Surgeon: Judeth Horn, MD;  Location: [place];  Service: General;  Laterality: N/A;   LYSIS OF ADHESION N/A 07/09/2018   Procedure: LYSIS OF ADHESION;  Surgeon: Judeth Horn, MD;  Location: [place];  Service: General;  Laterality: N/A;   PARTIAL COLECTOMY N/A 09/18/2017   Procedure: ILEOCOLECTOMY;  Surgeon: Judeth Horn, MD;  Location: [place];  Service: General;  Laterality: N/A;   PARTIAL COLECTOMY  09/25/2017   POLYPECTOMY  03/13/2020   Procedure: POLYPECTOMY;  Surgeon: Carol Ada, MD;  [facility];  Service: Endoscopy;;   RIGHT/LEFT HEART CATH AND CORONARY ANGIOGRAPHY N/A 06/01/2018   Procedure: RIGHT/LEFT HEART CATH AND CORONARY ANGIOGRAPHY;  Surgeon: Jettie Booze, MD;  Location: [place] [facility];  Service: Cardiovascular;  Laterality: N/A;   SIGMOIDOSCOPY N/A 09/18/2017   Procedure: SIGMOIDOSCOPY;  Surgeon: Judeth Horn, MD;  Location: [place];  Service: General;  Laterality: N/A;   SMALL INTESTINE SURGERY  07/09/2018   EXPLORATION OF  ABDOMINAL WOUND ERAS PATHWAYN/; MESH; LYSIS OF ADHESIONS   TOTAL ABDOMINAL HYSTERECTOMY  1990   TRANSFORAMINAL LUMBAR INTERBODY FUSION (TLIF) WITH PEDICLE SCREW FIXATION 2 LEVEL N/A 01/07/2020   Procedure: Lumbar Four-Five and Lumbar Five-Sacral One open lumbar decompression and transforaminal lumbar interbody fusion;  Surgeon: Judith Part, MD;  Location: [place];  Service: Neurosurgery;  Laterality: N/A;  Lumbar Four-Five and Lumbar Five-Sacral One open lumbar decompression and transforaminal lumbar interbody fusion   TRIGGER FINGER RELEASE Right 05/213   TRIGGER  FINGER RELEASE Right 04/08/2019   Procedure: RIGHT INDEX RELEASE TRIGGER FINGER/A-1 PULLEY;  Surgeon: Milly Jakob, MD;  Location: [place];  Service: Orthopedics;  Laterality: Right;   TUBAL LIGATION  1980s   VESICOVAGINAL FISTULA CLOSURE W/ TAH     Patient Active Problem List   Diagnosis Date Noted   Hypermagnesemia 10/27/2021   Diabetes mellitus without complication ([place]) 56/43/3295   Hav (hallux abducto valgus), unspecified laterality 06/26/2020   Lumbar stenosis with neurogenic claudication 01/07/2020   Generalized abdominal pain    CKD (chronic kidney disease) stage 4, GFR 15-29 ml/min (HCC)    SBO (small bowel obstruction) ([place]) 08/17/2019   Hyponatremia 08/17/2019   COVID-19 virus infection 08/17/2019   Acute lower UTI 08/17/2019   Prolonged QT interval 08/17/2019   Acute hypoxemic respiratory failure due to COVID-19 ([place]) 08/07/2019   Normocytic anemia 08/07/2019   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   Congestive heart failure (CHF) ([place]) 18/84/1660   Acute systolic heart failure ([place])    Type 2 diabetes mellitus with stage 3 chronic kidney disease, with long-term current use of insulin ([place]) 11/22/2017   Acute respiratory failure with hypoxia ([place]) 11/22/2017   Bowel obstruction ([place]) 08/23/2017   Colonic obstruction ([place]) 08/22/2017   Dehydration    Intractable vomiting with nausea    Hyperkalemia    Gastroenteritis 08/11/2017   Hypotension 08/11/2017   Acute renal failure superimposed on stage 3 chronic kidney disease ([place]) 08/11/2017   Hypovolemia 08/10/2017   Colostomy in place ([place]) 07/13/2017   Adjustment disorder with depressed mood    Debilitated 02/06/2017   S/P colostomy ([place]) 01/31/2017   Pressure injury of skin 01/28/2017   Colitis 01/26/2017   Acute MI ([place]) 05/05/2014   Anemia, iron deficiency 05/05/2014   Airway hyperreactivity 05/05/2014   Diabetes mellitus, type 2 ([place]) 63/07/6008   Chronic systolic heart failure ([place]) 05/05/2014   HLD  (hyperlipidemia) 05/05/2014   Hypothyroidism 05/05/2014   Nonischemic cardiomyopathy ([place]) 02/02/2014   LBBB (left bundle branch block) 93/23/5573   Chronic systolic dysfunction of left ventricle 02/02/2014   Chronic renal insufficiency 02/02/2014   Essential hypertension 02/02/2014   Morbid obesity ([place]) 02/02/2014   Coronary artery disease 08/15/2013   Shock ([place]) 08/15/2013   History of prolonged Q-T interval on ECG 10/31/2012   Hx of cardiomyopathy 10/31/2012   Automatic implantable cardioverter-defibrillator in situ 08/06/2008    REFERRING  DIAG: G57.02 (ICD-10-CM) - Piriformis syndrome of RIGHT side   THERAPY DIAG:  Pain in right hip  Decreased functional mobility and endurance  Other low back pain  Rationale for Evaluation and Treatment Rehabilitation  ONSET DATE: about 1 month ago   SUBJECTIVE:    SUBJECTIVE STATEMENT: Pt reports some R hip pain, but overall it is much better.  PAIN:  Are you having pain? Yes: NPRS scale: 4/10 with walking; 0/10 with Pain location: Rt buttock Pain description: gnawing pain  Aggravating factors: pressure Relieving factors: heating pad , laying down    PRECAUTIONS: None   WEIGHT BEARING RESTRICTIONS: No   FALLS:  Has patient fallen in last 6 months? No   LIVING ENVIRONMENT: Lives with: lives with their family Lives in: House/apartment Stairs: Yes: Internal: 13 steps; on right going up often crawls up there vs walking  Has following equipment at home: Single point cane, Walker - 2 wheeled, and bed side commode   OCCUPATION: RN Retired   PLOF: Independent with basic ADLs and Independent with household mobility with device   PATIENT GOALS: Patient wants to relieve this pain .    NEXT MD VISIT: Next week for L knee.    OBJECTIVE: (objective measures completed at initial evaluation unless otherwise dated)    DIAGNOSTIC FINDINGS: none    PATIENT SURVEYS:  FOTO 31%. 08/23/22=47%   COGNITION: Overall cognitive status:  Within functional limits for tasks assessed                         SENSATION: WFL   EDEMA:  NT   MUSCLE LENGTH: Hamstrings: Right tight, painful  deg; Left tight deg   POSTURE: rounded shoulders and flexed trunk    PALPATION: Patient with exquisite pain in the lower L 5 to S1, SIJ border extending laterally to outer hip.   LOWER EXTREMITY ROM:   Passive ROM Right eval Left eval  Hip flexion      Hip extension      Hip abduction      Hip adduction      Hip internal rotation 10-15 deg 15-20 deg   Hip external rotation good good  Knee flexion      Knee extension      Ankle dorsiflexion      Ankle plantarflexion      Ankle inversion      Ankle eversion       (Blank rows = not tested)   LOWER EXTREMITY MMT:   MMT Right eval Left eval  Hip flexion 4+/5 4/5  Hip extension      Hip abduction 3-/5 3-/5  Hip adduction      Hip internal rotation      Hip external rotation      Knee flexion 5/5 5/5  Knee extension 5/5 5/5  Ankle dorsiflexion      Ankle plantarflexion      Ankle inversion      Ankle eversion       (Blank rows = not tested)   LOWER EXTREMITY SPECIAL TESTS:  Hip special tests: NT due to pain    FUNCTIONAL TESTS:  5 times sit to stand: 24 sec  2 minute walk test: NT on eval 08/17/22=340"   GAIT: Distance walked: 150 Assistive device utilized: Walker - 2 wheeled Level of assistance: SBA  Comments: walker pushed far out from  upper body     TODAY'S TREATMENT: [facility] Adult PT Treatment:  DATE: 08/25/22 Therapeutic Exercise: Nustep 6 mins L5 UE/LE Supine Lower Trunk Rotation 5 reps - 10 hold Sit to stand s armrests c GTB lateral press 2 sets - 10 reps  SLS x5 30" each Standing Hip abd 5# 2 sets - 10 reps - 3 hold each Seated Hamstring Stretch 2 reps - 30 hold each Seated Piriformis Stretch  2 reps - 30 hold each Seated abdominal press x10 3" Manual Therapy: STM/DTM to the R gluteal and piriformis  muscles Skilled palpation to identify TrPs and taut muscle bands Trigger Point Dry Needling Treatment: Pre-treatment instruction: Patient instructed on dry needling rationale, procedures, and possible side effects including pain during treatment (achy,cramping feeling), bruising, drop of blood, lightheadedness, nausea, sweating. Patient Consent Given: Yes Education handout provided: Yes Muscles treated: R piriformis, Gluteal min. Gluteal med  Needle size and number: .30x167m x 1 Electrical stimulation performed: No Parameters: N/A Treatment response/outcome: Twitch response elicited Post-treatment instructions: Patient instructed to expect possible mild to moderate muscle soreness later today and/or tomorrow. Patient instructed in methods to reduce muscle soreness and to continue prescribed HEP. If patient was dry needled over the lung field, patient was instructed on signs and symptoms of pneumothorax and, however unlikely, to see immediate medical attention should they occur. Patient was also educated on signs and symptoms of infection and to seek medical attention should they occur. Patient verbalized understanding of these instructions and education.   [facility]                                                DATE: 08/23/22 Therapeutic Exercise: Nustep 5 mins L6 UE/LE Seated Hamstring Stretch 2 reps - 30 hold each Seated Piriformis Stretch  2 reps - 30 hold each Seated abdominal press x10 3" Supine Lower Trunk Rotation 5 reps - 5 hold Supine Bridge c GTB lateral press 2 sets - 10 reps - 5 hold Hooklying Clamshell GTB 2 sets - 10 reps - 5 hold Hook lying banded marching 2x10 SLR c quad set x15 each SL Hip abd x15 each  [facility] Adult PT Treatment:                                                DATE: 08/19/22 Therapeutic Exercise: Supine Bridge c GTB lateral press 2 sets - 10 reps - 5 hold Hooklying Clamshell GTB 2 sets - 10 reps - 5 hold Hook lying banded marching  2x10 Supine Lower Trunk Rotation 5 reps - 10 hold Seated Hamstring Stretch 2 reps - 30 hold each Supine Piriformis Stretch  2 reps - 30 hold each Seated abdominal press x10 3" Manual Therapy: STM/DTM to the R gluteal and piriformis muscles Skilled palpation to identify TrPs and taut muscle bands Trigger Point Dry Needling Treatment: Pre-treatment instruction: Patient instructed on dry needling rationale, procedures, and possible side effects including pain during treatment (achy,cramping feeling), bruising, drop of blood, lightheadedness, nausea, sweating. Patient Consent Given: Yes Education handout provided: Yes Muscles treated: R piriformis, Gluteal min. Gluteal med  Needle size and number: .30x1051mx 1 Electrical stimulation performed: No Parameters: N/A Treatment response/outcome: Twitch response elicited Post-treatment instructions: Patient instructed to expect possible mild to moderate muscle soreness later today and/or  tomorrow. Patient instructed in methods to reduce muscle soreness and to continue prescribed HEP. If patient was dry needled over the lung field, patient was instructed on signs and symptoms of pneumothorax and, however unlikely, to see immediate medical attention should they occur. Patient was also educated on signs and symptoms of infection and to seek medical attention should they occur. Patient verbalized understanding of these instructions and education.   [facility] Adult PT Treatment:                                                DATE: 08/17/22 Therapeutic Exercise: Supine Bridge 2 sets - 10 reps - 5 hold Hooklying Clamshell GTB 2 sets - 10 reps - 5 hold Seated abdominal press x10 3" Supine Lower Trunk Rotation 5 reps - 10 hold Seated Hamstring Stretch 2 reps - 30 hold each Seated Piriformis Stretch  2 reps - 30 hold each Updated HEP Manual Therapy: DTM to the r piriformis and gluteal muscles Therapeutic Activity: 2MWT Self Care: Use of tennis ball for R  gluteal massage                                                                                                                                PATIENT EDUCATION:  Education details: POC, HEP, differential diagnosis  Person educated: Patient Education method: Explanation and Demonstration Education comprehension: verbalized understanding   HOME EXERCISE PROGRAM: Access Code: [facility] URL: [email or web address] Date: 08/23/2022 Prepared by: Gar Ponto  Exercises - Supine Lower Trunk Rotation  - 1 x daily - 7 x weekly - 2 sets - 10 reps - 10 hold - Seated Hamstring Stretch  - 1 x daily - 7 x weekly - 1 sets - 3 reps - 30 hold - Seated Piriformis Stretch  - 1 x daily - 7 x weekly - 1 sets - 3 reps - 20-30 hold - Standing Hip Flexor Stretch  - 1 x daily - 7 x weekly - 1 sets - 3 reps - 20-30 hold - Bridge with Hip Abduction and Resistance  - 1 x daily - 7 x weekly - 2 sets - 10 reps - 5 hold - Clamshell with Resistance  - 1 x daily - 7 x weekly - 2 sets - 10 reps - 5 hold - Sidelying Hip Abduction  - 1 x daily - 7 x weekly - 2 sets - 10 reps - 5 hold - Active Straight Leg Raise with Quad Set  - 1 x daily - 7 x weekly - 2 sets - 10 reps - 5 hold   ASSESSMENT:   CLINICAL IMPRESSION: Manual therapy was completed as noted above and f/b TPDN to the R piriformis, glut minimus and glut medius. PT was then completed for flexibility and strengthening activation therex for these muscle groups. With SLS,  standing time was decreased R in comparison to L. Pt tolerated PT today without adverse effects. Pt will continue to benefit from skilled PT to address impairments for improved function of the R hip with less pain. Will initiate assessment of LTGs the next PT session.     OBJECTIVE IMPAIRMENTS: cardiopulmonary status limiting activity, decreased activity tolerance, decreased endurance, decreased mobility, difficulty walking, decreased ROM, decreased strength, increased fascial  restrictions, postural dysfunction, obesity, and pain.    ACTIVITY LIMITATIONS: carrying, lifting, bending, sitting, standing, squatting, sleeping, stairs, transfers, bed mobility, and locomotion level   PARTICIPATION LIMITATIONS: shopping and community activity   PERSONAL FACTORS: Age, Fitness, and 3+ comorbidities: Polyarthralgia, obesity, defibrillator, diabetes  are also affecting patient's functional outcome.    REHAB POTENTIAL: Good   CLINICAL DECISION MAKING: Stable/uncomplicated   EVALUATION COMPLEXITY: Low     GOALS: Goals reviewed with patient? Yes   SHORT TERM GOALS: Target date: 09/06/2022   Patient will be independent in initial home exercise program Baseline: Goal status:  MET   2.  Patient will be able to sit more symmetrically on the right hip to develop normal movement patterns Baseline:  Goal status: MET   3.  Patient will complete functional testing goals set (2-minute walk, TUG) Baseline:  Goal status: MET   4.  Patient will begin to tolerate standing for up to 10 minutes to positively impact ADLs and quality of life Baseline:  Status: 08/23/22=pt estimates 10 mins Goal status: MET    LONG TERM GOALS: Target date: 10/04/2022     Patient will improve FOTO score to 50% or greater to demonstrate improved functional mobility Baseline: 31% Goal status: Improving   2.  Patient will perform bed mobility without increased back and hip pain >50% of the time  Baseline:  Goal status: INITIAL   3.  Patient will be able to walk up stairs with modified independence using 1 rail, preferred pattern Baseline: often uses hands on step above to crawl  Goal status: INITIAL   4.  2 min walk test TBA /other goals  Baseline:  Goal status: INITIAL   5.  Patient will be independent with home exercise program upon discharge Baseline:  Goal status: INITIAL       PLAN:   PT FREQUENCY: 2x/week   PT DURATION: 8 weeks   PLANNED INTERVENTIONS: Therapeutic  exercises, Therapeutic activity, Neuromuscular re-education, Balance training, Gait training, Patient/Family education, Self Care, Joint mobilization, Stair training, Aquatic Therapy, Cryotherapy, Moist heat, Ionotophoresis '4mg'$ /ml Dexamethasone, Manual therapy, and Re-evaluation   PLAN FOR NEXT SESSION: 2-minute walk test.  NuStep.  Check home exercise program.  Consider manual therapy in side-lying to right hip/trunk    Gar Ponto MS, PT 08/25/22 2:31 PM

## 2022-08-30 ENCOUNTER — Ambulatory Visit: Payer: Medicare Other

## 2022-08-30 DIAGNOSIS — Z7409 Other reduced mobility: Secondary | ICD-10-CM | POA: Diagnosis not present

## 2022-08-30 DIAGNOSIS — M25551 Pain in right hip: Secondary | ICD-10-CM

## 2022-08-30 DIAGNOSIS — M5459 Other low back pain: Secondary | ICD-10-CM

## 2022-08-30 NOTE — Therapy (Signed)
OUTPATIENT PHYSICAL THERAPY TREATMENT NOTE   Patient Name: Crystal [name] MRN: [id number] DOB:[birth year], 72 y.o., female Today's Date: 08/30/2022  PCP: Crist Infante MD  REFERRING PROVIDER: Alysia Penna MD    END OF SESSION:   PT End of Session - 08/30/22 1113     Visit Number 7    Number of Visits 16    Date for PT Re-Evaluation 10/04/22    Authorization Type Medicare    PT Start Time 1100    PT Stop Time 3300    PT Time Calculation (min) 45 min    Activity Tolerance Patient tolerated treatment well    Behavior During Therapy [facility] for tasks assessed/performed                Past Medical History:  Diagnosis Date   AICD (automatic cardioverter/defibrillator) present    high RV threshold chronically, device was turned off in 2014; Device battery has been dead x 7 years; "turned it back on 06/22/2018"   Anemia, iron deficiency    Angioedema    felt to likely be due to ace inhibitors but says she has had this even off of medicines, appears to be tolerating ARBs chronically   Anxiety    Arthritis    "hands, legs, arms; bad in my back" (06/22/2018)   Asthmatic bronchitis with status asthmaticus    Bradycardia    CHF (congestive heart failure) (HCC)    Chronic lower back pain    Chronic pain    CKD (chronic kidney disease), stage III ([place])    Coronary artery disease    Mild nonobstructive (30% LAD, 30% RCA) by 09/01/16 cath at [facility]   Degenerative joint disease    Depression    Diabetes mellitus, type 2 ([place])    Diabetic peripheral neuropathy ([place])    Dizziness    Dyspnea on exertion    Family history of adverse reaction to anesthesia    Mother has nausea   GERD (gastroesophageal reflux disease)    Gout    Heart valve disorder    History of blood transfusion 1981; ~ 2005; 12/2016   "childbirth; defibrillator OR; colostomy OR"   History of mononucleosis 03/2014   Hyperlipidemia    Hypertension    Hypothyroid    Insomnia    Intervertebral disc  degeneration    Ischemic cardiomyopathy    LBBB (left bundle branch block)    Myocardial infarction ([place]) dx'd ~ 2005   Nonischemic cardiomyopathy ([place])    s/p ICD in 2005. EF has since recovered   Obesity    On home oxygen therapy    "2L at night" (06/22/2018)   OSA on CPAP    Pneumonia    "couple times; last time was 12/2016" (06/22/2018)   PONV (postoperative nausea and vomiting)    Presence of permanent cardiac pacemaker    [employer]   Swelling    Syncope    Systemic hypertension    Vitamin D deficiency    Past Surgical History:  Procedure Laterality Date   APPENDECTOMY  07/13/2017   BLADDER SUSPENSION  1990   "w/hysterectomy"   BOWEL RESECTION N/A 07/09/2018   Procedure: SMALL BOWEL RESECTION;  Surgeon: Judeth Horn, MD;  Location: [place];  Service: General;  Laterality: N/A;   CARDIAC CATHETERIZATION  2005   no obstructive CAD per patient   CARDIAC CATHETERIZATION  2018   CARDIAC DEFIBRILLATOR PLACEMENT  2005   BiV ICD implanted,  LV lead is an epicardial lead  CARPAL TUNNEL RELEASE Left 07/25/2014   Dr.Williamson    CARPAL TUNNEL RELEASE Right 04/08/2019   Procedure: RIGHT CARPAL TUNNEL RELEASE ENDOSCOPIC;  Surgeon: Milly Jakob, MD;  Location: [place];  Service: Orthopedics;  Laterality: Right;   CATARACT EXTRACTION W/ INTRAOCULAR LENS  IMPLANT, BILATERAL Bilateral    COLONIC STENT PLACEMENT N/A 08/31/2017   Procedure: COLONIC STENT PLACEMENT;  Surgeon: Carol Ada, MD;  Location: [place];  Service: Endoscopy;  Laterality: N/A;   COLONOSCOPY     2010-2011 Dr.Kipreos    COLOSTOMY  12/2016   Archie Endo 01/26/2017   COLOSTOMY REVERSAL N/A 07/13/2017   Procedure: COLOSTOMY REVERSAL;  Surgeon: Judeth Horn, MD;  Location: [place];  Service: General;  Laterality: N/A;   CYST REMOVAL HAND Right 05/2013   thumb   DILATION AND CURETTAGE OF UTERUS  X 5-6   ESOPHAGOGASTRODUODENOSCOPY (EGD) WITH PROPOFOL N/A 03/13/2020   Procedure: ESOPHAGOGASTRODUODENOSCOPY (EGD) WITH  PROPOFOL;  Surgeon: Carol Ada, MD;  [facility];  Service: Endoscopy;  Laterality: N/A;   EXCISION MASS ABDOMINAL N/A 07/09/2018   Procedure: EXPLORATION OF  ABDOMINAL WOUND ERAS PATHWAY;  Surgeon: Judeth Horn, MD;  Location: [place];  Service: General;  Laterality: N/A;   FLEXIBLE SIGMOIDOSCOPY N/A 08/24/2017   Procedure: Beryle Quant;  Surgeon: Carol Ada, MD;  Location: [place];  Service: Endoscopy;  Laterality: N/A;   FLEXIBLE SIGMOIDOSCOPY N/A 08/31/2017   Procedure: FLEXIBLE SIGMOIDOSCOPY;  Surgeon: Carol Ada, MD;  Location: [place];  Service: Endoscopy;  Laterality: N/A;  stent placement   FLEXIBLE SIGMOIDOSCOPY N/A 03/13/2020   Procedure: FLEXIBLE SIGMOIDOSCOPY;  Surgeon: Carol Ada, MD;  [facility];  Service: Endoscopy;  Laterality: N/A;   FLEXIBLE SIGMOIDOSCOPY N/A 12/18/2020   Procedure: FLEXIBLE SIGMOIDOSCOPY;  Surgeon: Carol Ada, MD;  [facility];  Service: Endoscopy;  Laterality: N/A;   HEMOSTASIS CLIP PLACEMENT  03/13/2020   Procedure: HEMOSTASIS CLIP PLACEMENT;  Surgeon: Carol Ada, MD;  [facility];  Service: Endoscopy;;   HEMOSTASIS CLIP PLACEMENT  12/18/2020   Procedure: HEMOSTASIS CLIP PLACEMENT;  Surgeon: Carol Ada, MD;  [facility];  Service: Endoscopy;;   HOT HEMOSTASIS N/A 03/13/2020   Procedure: HOT HEMOSTASIS (ARGON PLASMA COAGULATION/BICAP);  Surgeon: Carol Ada, MD;  [facility];  Service: Endoscopy;  Laterality: N/A;   HOT HEMOSTASIS N/A 12/18/2020   Procedure: HOT HEMOSTASIS (ARGON PLASMA COAGULATION/BICAP);  Surgeon: Carol Ada, MD;  Location[facility];  Service: Endoscopy;  Laterality: N/A;   IMPLANTABLE CARDIOVERTER DEFIBRILLATOR GENERATOR CHANGE  2008   INSERTION OF MESH N/A 07/09/2018   Procedure: INSERTION OF VICRYL MESH;  Surgeon: Judeth Horn, MD;  Location: [place];  Service: General;  Laterality: N/A;   LAPAROTOMY N/A 09/18/2017   Procedure:  EXPLORATORY LAPAROTOMY;  Surgeon: Judeth Horn, MD;  Location: [place];  Service: General;  Laterality: N/A;   LEAD REVISION  06/22/2018   LEAD REVISION/REPAIR N/A 06/22/2018   Procedure: LEAD REVISION/REPAIR;  Surgeon: Deboraha Sprang, MD;  Location: [place] [facility];  Service: Cardiovascular;  Laterality: N/A;   LYSIS OF ADHESION N/A 09/18/2017   Procedure: LYSIS OF ADHESION;  Surgeon: Judeth Horn, MD;  Location: [place];  Service: General;  Laterality: N/A;   LYSIS OF ADHESION N/A 07/09/2018   Procedure: LYSIS OF ADHESION;  Surgeon: Judeth Horn, MD;  Location: [place];  Service: General;  Laterality: N/A;   PARTIAL COLECTOMY N/A 09/18/2017   Procedure: ILEOCOLECTOMY;  Surgeon: Judeth Horn, MD;  Location: [place];  Service: General;  Laterality: N/A;   PARTIAL COLECTOMY  09/25/2017   POLYPECTOMY  03/13/2020   Procedure: POLYPECTOMY;  Surgeon: Carol Ada, MD;  [facility];  Service: Endoscopy;;   RIGHT/LEFT HEART CATH AND CORONARY ANGIOGRAPHY N/A 06/01/2018   Procedure: RIGHT/LEFT HEART CATH AND CORONARY ANGIOGRAPHY;  Surgeon: Jettie Booze, MD;  Location: [place] [facility];  Service: Cardiovascular;  Laterality: N/A;   SIGMOIDOSCOPY N/A 09/18/2017   Procedure: SIGMOIDOSCOPY;  Surgeon: Judeth Horn, MD;  Location: [place];  Service: General;  Laterality: N/A;   SMALL INTESTINE SURGERY  07/09/2018   EXPLORATION OF  ABDOMINAL WOUND ERAS PATHWAYN/; MESH; LYSIS OF ADHESIONS   TOTAL ABDOMINAL HYSTERECTOMY  1990   TRANSFORAMINAL LUMBAR INTERBODY FUSION (TLIF) WITH PEDICLE SCREW FIXATION 2 LEVEL N/A 01/07/2020   Procedure: Lumbar Four-Five and Lumbar Five-Sacral One open lumbar decompression and transforaminal lumbar interbody fusion;  Surgeon: Judith Part, MD;  Location: [place];  Service: Neurosurgery;  Laterality: N/A;  Lumbar Four-Five and Lumbar Five-Sacral One open lumbar decompression and transforaminal lumbar interbody fusion   TRIGGER FINGER RELEASE Right 05/213   TRIGGER  FINGER RELEASE Right 04/08/2019   Procedure: RIGHT INDEX RELEASE TRIGGER FINGER/A-1 PULLEY;  Surgeon: Milly Jakob, MD;  Location: [place];  Service: Orthopedics;  Laterality: Right;   TUBAL LIGATION  1980s   VESICOVAGINAL FISTULA CLOSURE W/ TAH     Patient Active Problem List   Diagnosis Date Noted   Hypermagnesemia 10/27/2021   Diabetes mellitus without complication ([place]) 84/53/6468   Hav (hallux abducto valgus), unspecified laterality 06/26/2020   Lumbar stenosis with neurogenic claudication 01/07/2020   Generalized abdominal pain    CKD (chronic kidney disease) stage 4, GFR 15-29 ml/min (HCC)    SBO (small bowel obstruction) ([place]) 08/17/2019   Hyponatremia 08/17/2019   COVID-19 virus infection 08/17/2019   Acute lower UTI 08/17/2019   Prolonged QT interval 08/17/2019   Acute hypoxemic respiratory failure due to COVID-19 ([place]) 08/07/2019   Normocytic anemia 08/07/2019   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   Congestive heart failure (CHF) ([place]) 10/13/2246   Acute systolic heart failure ([place])    Type 2 diabetes mellitus with stage 3 chronic kidney disease, with long-term current use of insulin ([place]) 11/22/2017   Acute respiratory failure with hypoxia ([place]) 11/22/2017   Bowel obstruction ([place]) 08/23/2017   Colonic obstruction ([place]) 08/22/2017   Dehydration    Intractable vomiting with nausea    Hyperkalemia    Gastroenteritis 08/11/2017   Hypotension 08/11/2017   Acute renal failure superimposed on stage 3 chronic kidney disease ([place]) 08/11/2017   Hypovolemia 08/10/2017   Colostomy in place ([place]) 07/13/2017   Adjustment disorder with depressed mood    Debilitated 02/06/2017   S/P colostomy ([place]) 01/31/2017   Pressure injury of skin 01/28/2017   Colitis 01/26/2017   Acute MI ([place]) 05/05/2014   Anemia, iron deficiency 05/05/2014   Airway hyperreactivity 05/05/2014   Diabetes mellitus, type 2 ([place]) 25/00/3704   Chronic systolic heart failure ([place]) 05/05/2014   HLD  (hyperlipidemia) 05/05/2014   Hypothyroidism 05/05/2014   Nonischemic cardiomyopathy ([place]) 02/02/2014   LBBB (left bundle branch block) 88/89/1694   Chronic systolic dysfunction of left ventricle 02/02/2014   Chronic renal insufficiency 02/02/2014   Essential hypertension 02/02/2014   Morbid obesity ([place]) 02/02/2014   Coronary artery disease 08/15/2013   Shock ([place]) 08/15/2013   History of prolonged Q-T interval on ECG 10/31/2012   Hx of cardiomyopathy 10/31/2012   Automatic implantable cardioverter-defibrillator in situ 08/06/2008    REFERRING  DIAG: G57.02 (ICD-10-CM) - Piriformis syndrome of RIGHT side   THERAPY DIAG:  Pain in right hip  Decreased functional mobility and endurance  Other low back pain  Rationale for Evaluation and Treatment Rehabilitation  ONSET DATE: about 1 month ago   SUBJECTIVE:    SUBJECTIVE STATEMENT: Pt reports R gluteal, in one particular position, primarily now when sitting.  PAIN:  Are you having pain? Yes: NPRS scale: 0-1/10 with walking; 3/10 with sitting Pain location: Rt buttock Pain description: gnawing pain  Aggravating factors: pressure Relieving factors: heating pad , laying down    PRECAUTIONS: None   WEIGHT BEARING RESTRICTIONS: No   FALLS:  Has patient fallen in last 6 months? No   LIVING ENVIRONMENT: Lives with: lives with their family Lives in: House/apartment Stairs: Yes: Internal: 13 steps; on right going up often crawls up there vs walking  Has following equipment at home: Single point cane, Walker - 2 wheeled, and bed side commode   OCCUPATION: RN Retired   PLOF: Independent with basic ADLs and Independent with household mobility with device   PATIENT GOALS: Patient wants to relieve this pain .    NEXT MD VISIT: Next week for L knee.    OBJECTIVE: (objective measures completed at initial evaluation unless otherwise dated)    DIAGNOSTIC FINDINGS: none    PATIENT SURVEYS:  FOTO 31%. 08/23/22=47%    COGNITION: Overall cognitive status: Within functional limits for tasks assessed                         SENSATION: WFL   EDEMA:  NT   MUSCLE LENGTH: Hamstrings: Right tight, painful  deg; Left tight deg   POSTURE: rounded shoulders and flexed trunk    PALPATION: Patient with exquisite pain in the lower L 5 to S1, SIJ border extending laterally to outer hip.   LOWER EXTREMITY ROM:   Passive ROM Right eval Left eval  Hip flexion      Hip extension      Hip abduction      Hip adduction      Hip internal rotation 10-15 deg 15-20 deg   Hip external rotation good good  Knee flexion      Knee extension      Ankle dorsiflexion      Ankle plantarflexion      Ankle inversion      Ankle eversion       (Blank rows = not tested)   LOWER EXTREMITY MMT:   MMT Right eval Left eval  Hip flexion 4+/5 4/5  Hip extension      Hip abduction 3-/5 3-/5  Hip adduction      Hip internal rotation      Hip external rotation      Knee flexion 5/5 5/5  Knee extension 5/5 5/5  Ankle dorsiflexion      Ankle plantarflexion      Ankle inversion      Ankle eversion       (Blank rows = not tested)   LOWER EXTREMITY SPECIAL TESTS:  Hip special tests: NT due to pain    FUNCTIONAL TESTS:  5 times sit to stand: 24 sec  2 minute walk test: NT on eval 08/17/22=340"   GAIT: Distance walked: 150 Assistive device utilized: Walker - 2 wheeled Level of assistance: SBA  Comments: walker pushed far out from  upper body     TODAY'S TREATMENT: [facility] Adult PT Treatment:  DATE: 08/30/22 Therapeutic Exercise: Nustep 5 mins L6 UE/LE Supine thomas stretch x3 20" Supine Bridge c GTB lateral press 2 sets - 10 reps - 5 hold Standing hip ext x10 each Standing knee flexion x10 Sit to stand x10 Seated Hamstring Stretch 2 reps - 30 hold each Seated Piriformis Stretch  2 reps - 30 hold each Manual Therapy: TTP to the R ischial tuberosity area- pt  identifies as her current area of pain  [facility] Adult PT Treatment:                                                DATE: 08/25/22 Therapeutic Exercise: Nustep 6 mins L5 UE/LE Supine Lower Trunk Rotation 5 reps - 10 hold Sit to stand s armrests c GTB lateral press 2 sets - 10 reps  SLS x5 30" each Standing Hip abd 5# 2 sets - 10 reps - 3 hold each Seated Hamstring Stretch 2 reps - 30 hold each Seated Piriformis Stretch  2 reps - 30 hold each Seated abdominal press x10 3" Manual Therapy: STM/DTM to the R gluteal and piriformis muscles Skilled palpation to identify TrPs and taut muscle bands Trigger Point Dry Needling Treatment: Pre-treatment instruction: Patient instructed on dry needling rationale, procedures, and possible side effects including pain during treatment (achy,cramping feeling), bruising, drop of blood, lightheadedness, nausea, sweating. Patient Consent Given: Yes Education handout provided: Yes Muscles treated: R piriformis, Gluteal min. Gluteal med  Needle size and number: .30x125m x 1 Electrical stimulation performed: No Parameters: N/A Treatment response/outcome: Twitch response elicited Post-treatment instructions: Patient instructed to expect possible mild to moderate muscle soreness later today and/or tomorrow. Patient instructed in methods to reduce muscle soreness and to continue prescribed HEP. If patient was dry needled over the lung field, patient was instructed on signs and symptoms of pneumothorax and, however unlikely, to see immediate medical attention should they occur. Patient was also educated on signs and symptoms of infection and to seek medical attention should they occur. Patient verbalized understanding of these instructions and education.   [facility]:                                                DATE: 08/23/22 Therapeutic Exercise: Nustep 5 mins L6 UE/LE Seated Hamstring Stretch 2 reps - 30 hold each Seated Piriformis Stretch  2 reps - 30  hold each Seated abdominal press x10 3" Supine Lower Trunk Rotation 5 reps - 5 hold Supine Bridge c GTB lateral press 2 sets - 10 reps - 5 hold Hooklying Clamshell GTB 2 sets - 10 reps - 5 hold Hook lying banded marching 2x10 SLR c quad set x15 each SL Hip abd x15 each                                                            PATIENT EDUCATION:  Education details: POC, HEP, differential diagnosis  Person educated: Patient Education method: ECustomer service managerEducation comprehension: verbalized understanding   HOME EXERCISE PROGRAM: Access Code: [email or web address] RegionURL:  [email or web address] Date: 08/23/2022 Prepared by: Gar Ponto  Exercises - Supine Lower Trunk Rotation  - 1 x daily - 7 x weekly - 2 sets - 10 reps - 10 hold - Seated Hamstring Stretch  - 1 x daily - 7 x weekly - 1 sets - 3 reps - 30 hold - Seated Piriformis Stretch  - 1 x daily - 7 x weekly - 1 sets - 3 reps - 20-30 hold - Standing Hip Flexor Stretch  - 1 x daily - 7 x weekly - 1 sets - 3 reps - 20-30 hold - Bridge with Hip Abduction and Resistance  - 1 x daily - 7 x weekly - 2 sets - 10 reps - 5 hold - Clamshell with Resistance  - 1 x daily - 7 x weekly - 2 sets - 10 reps - 5 hold - Sidelying Hip Abduction  - 1 x daily - 7 x weekly - 2 sets - 10 reps - 5 hold - Active Straight Leg Raise with Quad Set  - 1 x daily - 7 x weekly - 2 sets - 10 reps - 5 hold   ASSESSMENT:   CLINICAL IMPRESSION: Pt's area of pain has moved distally and pt is TTP of the R ischial tuberosity. PT was focused on hamstring and hip flexor flexibility, and hip ext and knee flexion strengthening. Pt responded to the session reporting relief of the pain with which she presented. Pt will continue to benefit from skilled PT to address impairments for improved function of the R LE with less pain.   OBJECTIVE IMPAIRMENTS: cardiopulmonary status limiting activity, decreased activity tolerance, decreased endurance, decreased mobility,  difficulty walking, decreased ROM, decreased strength, increased fascial restrictions, postural dysfunction, obesity, and pain.    ACTIVITY LIMITATIONS: carrying, lifting, bending, sitting, standing, squatting, sleeping, stairs, transfers, bed mobility, and locomotion level   PARTICIPATION LIMITATIONS: shopping and community activity   PERSONAL FACTORS: Age, Fitness, and 3+ comorbidities: Polyarthralgia, obesity, defibrillator, diabetes  are also affecting patient's functional outcome.    REHAB POTENTIAL: Good   CLINICAL DECISION MAKING: Stable/uncomplicated   EVALUATION COMPLEXITY: Low     GOALS: Goals reviewed with patient? Yes   SHORT TERM GOALS: Target date: 09/06/2022   Patient will be independent in initial home exercise program Baseline: Goal status:  MET   2.  Patient will be able to sit more symmetrically on the right hip to develop normal movement patterns Baseline:  Goal status: MET   3.  Patient will complete functional testing goals set (2-minute walk, TUG) Baseline:  Goal status: MET   4.  Patient will begin to tolerate standing for up to 10 minutes to positively impact ADLs and quality of life Baseline:  Status: 08/23/22=pt estimates 10 mins Goal status: MET    LONG TERM GOALS: Target date: 10/04/2022     Patient will improve FOTO score to 50% or greater to demonstrate improved functional mobility Baseline: 31% Goal status: Improving   2.  Patient will perform bed mobility without increased back and hip pain >50% of the time  Baseline:  Goal status: INITIAL   3.  Patient will be able to walk up stairs with modified independence using 1 rail, preferred pattern Baseline: often uses hands on step above to crawl  Goal status: INITIAL   4.  2 min walk test TBA /other goals  Baseline:  Goal status: INITIAL   5.  Patient will be independent with home exercise program upon discharge Baseline:  Goal status: INITIAL  PLAN:   PT FREQUENCY:  2x/week   PT DURATION: 8 weeks   PLANNED INTERVENTIONS: Therapeutic exercises, Therapeutic activity, Neuromuscular re-education, Balance training, Gait training, Patient/Family education, Self Care, Joint mobilization, Stair training, Aquatic Therapy, Cryotherapy, Moist heat, Ionotophoresis '4mg'$ /ml Dexamethasone, Manual therapy, and Re-evaluation   PLAN FOR NEXT SESSION: 2-minute walk test.  NuStep.  Check home exercise program.  Consider manual therapy in side-lying to right hip/trunk   Gar Ponto MS, PT 08/30/22 1:37 PM

## 2022-09-01 ENCOUNTER — Ambulatory Visit: Payer: Medicare Other

## 2022-09-01 DIAGNOSIS — M5459 Other low back pain: Secondary | ICD-10-CM

## 2022-09-01 DIAGNOSIS — Z7409 Other reduced mobility: Secondary | ICD-10-CM | POA: Diagnosis not present

## 2022-09-01 DIAGNOSIS — M25551 Pain in right hip: Secondary | ICD-10-CM

## 2022-09-01 NOTE — Therapy (Signed)
OUTPATIENT PHYSICAL THERAPY TREATMENT NOTE   Patient Name: Crystal [name] MRN: [id number] DOB:02-Aug-1950, 72 y.o., female Today's Date: 09/01/2022  PCP: Crist Infante MD  REFERRING PROVIDER: Alysia Penna MD    END OF SESSION:   PT End of Session - 09/01/22 1018     Visit Number 8    Number of Visits 16    Date for PT Re-Evaluation 10/04/22    Authorization Type Medicare    PT Start Time 5035    PT Stop Time 1100    PT Time Calculation (min) 45 min    Activity Tolerance Patient tolerated treatment well    Behavior During Therapy [facility] for tasks assessed/performed                 Past Medical History:  Diagnosis Date   AICD (automatic cardioverter/defibrillator) present    high RV threshold chronically, device was turned off in 2014; Device battery has been dead x 7 years; "turned it back on 06/22/2018"   Anemia, iron deficiency    Angioedema    felt to likely be due to ace inhibitors but says she has had this even off of medicines, appears to be tolerating ARBs chronically   Anxiety    Arthritis    "hands, legs, arms; bad in my back" (06/22/2018)   Asthmatic bronchitis with status asthmaticus    Bradycardia    CHF (congestive heart failure) (HCC)    Chronic lower back pain    Chronic pain    CKD (chronic kidney disease), stage III ([place])    Coronary artery disease    Mild nonobstructive (30% LAD, 30% RCA) by 09/01/16 cath at [facility]   Degenerative joint disease    Depression    Diabetes mellitus, type 2 ([place])    Diabetic peripheral neuropathy ([place])    Dizziness    Dyspnea on exertion    Family history of adverse reaction to anesthesia    Mother has nausea   GERD (gastroesophageal reflux disease)    Gout    Heart valve disorder    History of blood transfusion 1981; ~ 2005; 12/2016   "childbirth; defibrillator OR; colostomy OR"   History of mononucleosis 03/2014   Hyperlipidemia    Hypertension    Hypothyroid    Insomnia    Intervertebral disc  degeneration    Ischemic cardiomyopathy    LBBB (left bundle branch block)    Myocardial infarction ([place]) dx'd ~ 2005   Nonischemic cardiomyopathy ([place])    s/p ICD in 2005. EF has since recovered   Obesity    On home oxygen therapy    "2L at night" (06/22/2018)   OSA on CPAP    Pneumonia    "couple times; last time was 12/2016" (06/22/2018)   PONV (postoperative nausea and vomiting)    Presence of permanent cardiac pacemaker    [employer]   Swelling    Syncope    Systemic hypertension    Vitamin D deficiency    Past Surgical History:  Procedure Laterality Date   APPENDECTOMY  07/13/2017   BLADDER SUSPENSION  1990   "w/hysterectomy"   BOWEL RESECTION N/A 07/09/2018   Procedure: SMALL BOWEL RESECTION;  Surgeon: Judeth Horn, MD;  Location: [place];  Service: General;  Laterality: N/A;   CARDIAC CATHETERIZATION  2005   no obstructive CAD per patient   CARDIAC CATHETERIZATION  2018   CARDIAC DEFIBRILLATOR PLACEMENT  2005   BiV ICD implanted,  LV lead is an epicardial lead  CARPAL TUNNEL RELEASE Left 07/25/2014   Dr.Williamson    CARPAL TUNNEL RELEASE Right 04/08/2019   Procedure: RIGHT CARPAL TUNNEL RELEASE ENDOSCOPIC;  Surgeon: Milly Jakob, MD;  Location: [place];  Service: Orthopedics;  Laterality: Right;   CATARACT EXTRACTION W/ INTRAOCULAR LENS  IMPLANT, BILATERAL Bilateral    COLONIC STENT PLACEMENT N/A 08/31/2017   Procedure: COLONIC STENT PLACEMENT;  Surgeon: Carol Ada, MD;  Location: [place];  Service: Endoscopy;  Laterality: N/A;   COLONOSCOPY     2010-2011 Dr.Kipreos    COLOSTOMY  12/2016   Archie Endo 01/26/2017   COLOSTOMY REVERSAL N/A 07/13/2017   Procedure: COLOSTOMY REVERSAL;  Surgeon: Judeth Horn, MD;  Location: [place];  Service: General;  Laterality: N/A;   CYST REMOVAL HAND Right 05/2013   thumb   DILATION AND CURETTAGE OF UTERUS  X 5-6   ESOPHAGOGASTRODUODENOSCOPY (EGD) WITH PROPOFOL N/A 03/13/2020   Procedure: ESOPHAGOGASTRODUODENOSCOPY (EGD) WITH  PROPOFOL;  Surgeon: Carol Ada, MD;  [facility];  Service: Endoscopy;  Laterality: N/A;   EXCISION MASS ABDOMINAL N/A 07/09/2018   Procedure: EXPLORATION OF  ABDOMINAL WOUND ERAS PATHWAY;  Surgeon: Judeth Horn, MD;  Location: [place];  Service: General;  Laterality: N/A;   FLEXIBLE SIGMOIDOSCOPY N/A 08/24/2017   Procedure: Beryle Quant;  Surgeon: Carol Ada, MD;  Location: [place];  Service: Endoscopy;  Laterality: N/A;   FLEXIBLE SIGMOIDOSCOPY N/A 08/31/2017   Procedure: FLEXIBLE SIGMOIDOSCOPY;  Surgeon: Carol Ada, MD;  Location: [place];  Service: Endoscopy;  Laterality: N/A;  stent placement   FLEXIBLE SIGMOIDOSCOPY N/A 03/13/2020   Procedure: FLEXIBLE SIGMOIDOSCOPY;  Surgeon: Carol Ada, MD;  [facility];  Service: Endoscopy;  Laterality: N/A;   FLEXIBLE SIGMOIDOSCOPY N/A 12/18/2020   Procedure: FLEXIBLE SIGMOIDOSCOPY;  Surgeon: Carol Ada, MD;  [facility];  Service: Endoscopy;  Laterality: N/A;   HEMOSTASIS CLIP PLACEMENT  03/13/2020   Procedure: HEMOSTASIS CLIP PLACEMENT;  Surgeon: Carol Ada, MD;  [facility];  Service: Endoscopy;;   HEMOSTASIS CLIP PLACEMENT  12/18/2020   Procedure: HEMOSTASIS CLIP PLACEMENT;  Surgeon: Carol Ada, MD;  [facility];  Service: Endoscopy;;   HOT HEMOSTASIS N/A 03/13/2020   Procedure: HOT HEMOSTASIS (ARGON PLASMA COAGULATION/BICAP);  Surgeon: Carol Ada, MD;  [facility];  Service: Endoscopy;  Laterality: N/A;   HOT HEMOSTASIS N/A 12/18/2020   Procedure: HOT HEMOSTASIS (ARGON PLASMA COAGULATION/BICAP);  Surgeon: Carol Ada, MD;  [facility];  Service: Endoscopy;  Laterality: N/A;   IMPLANTABLE CARDIOVERTER DEFIBRILLATOR GENERATOR CHANGE  2008   INSERTION OF MESH N/A 07/09/2018   Procedure: INSERTION OF VICRYL MESH;  Surgeon: Judeth Horn, MD;  Location: [place];  Service: General;  Laterality: N/A;   LAPAROTOMY N/A 09/18/2017   Procedure:  EXPLORATORY LAPAROTOMY;  Surgeon: Judeth Horn, MD;  Location: [place];  Service: General;  Laterality: N/A;   LEAD REVISION  06/22/2018   LEAD REVISION/REPAIR N/A 06/22/2018   Procedure: LEAD REVISION/REPAIR;  Surgeon: Deboraha Sprang, MD;  Location: [place] [facility];  Service: Cardiovascular;  Laterality: N/A;   LYSIS OF ADHESION N/A 09/18/2017   Procedure: LYSIS OF ADHESION;  Surgeon: Judeth Horn, MD;  Location: [place];  Service: General;  Laterality: N/A;   LYSIS OF ADHESION N/A 07/09/2018   Procedure: LYSIS OF ADHESION;  Surgeon: Judeth Horn, MD;  Location: [place];  Service: General;  Laterality: N/A;   PARTIAL COLECTOMY N/A 09/18/2017   Procedure: ILEOCOLECTOMY;  Surgeon: Judeth Horn, MD;  Location: [place];  Service: General;  Laterality: N/A;   PARTIAL COLECTOMY  09/25/2017   POLYPECTOMY  03/13/2020   Procedure: POLYPECTOMY;  Surgeon: Carol Ada, MD;  [facility];  Service: Endoscopy;;   RIGHT/LEFT HEART CATH AND CORONARY ANGIOGRAPHY N/A 06/01/2018   Procedure: RIGHT/LEFT HEART CATH AND CORONARY ANGIOGRAPHY;  Surgeon: Jettie Booze, MD;  Location: [place] [facility];  Service: Cardiovascular;  Laterality: N/A;   SIGMOIDOSCOPY N/A 09/18/2017   Procedure: SIGMOIDOSCOPY;  Surgeon: Judeth Horn, MD;  Location: [place];  Service: General;  Laterality: N/A;   SMALL INTESTINE SURGERY  07/09/2018   EXPLORATION OF  ABDOMINAL WOUND ERAS PATHWAYN/; MESH; LYSIS OF ADHESIONS   TOTAL ABDOMINAL HYSTERECTOMY  1990   TRANSFORAMINAL LUMBAR INTERBODY FUSION (TLIF) WITH PEDICLE SCREW FIXATION 2 LEVEL N/A 01/07/2020   Procedure: Lumbar Four-Five and Lumbar Five-Sacral One open lumbar decompression and transforaminal lumbar interbody fusion;  Surgeon: Judith Part, MD;  Location: [place];  Service: Neurosurgery;  Laterality: N/A;  Lumbar Four-Five and Lumbar Five-Sacral One open lumbar decompression and transforaminal lumbar interbody fusion   TRIGGER FINGER RELEASE Right 05/213   TRIGGER  FINGER RELEASE Right 04/08/2019   Procedure: RIGHT INDEX RELEASE TRIGGER FINGER/A-1 PULLEY;  Surgeon: Milly Jakob, MD;  Location: [place];  Service: Orthopedics;  Laterality: Right;   TUBAL LIGATION  1980s   VESICOVAGINAL FISTULA CLOSURE W/ TAH     Patient Active Problem List   Diagnosis Date Noted   Hypermagnesemia 10/27/2021   Diabetes mellitus without complication ([place]) 70/26/3785   Hav (hallux abducto valgus), unspecified laterality 06/26/2020   Lumbar stenosis with neurogenic claudication 01/07/2020   Generalized abdominal pain    CKD (chronic kidney disease) stage 4, GFR 15-29 ml/min (HCC)    SBO (small bowel obstruction) ([place]) 08/17/2019   Hyponatremia 08/17/2019   COVID-19 virus infection 08/17/2019   Acute lower UTI 08/17/2019   Prolonged QT interval 08/17/2019   Acute hypoxemic respiratory failure due to COVID-19 ([place]) 08/07/2019   Normocytic anemia 08/07/2019   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   Congestive heart failure (CHF) ([place]) 88/50/2774   Acute systolic heart failure ([place])    Type 2 diabetes mellitus with stage 3 chronic kidney disease, with long-term current use of insulin ([place]) 11/22/2017   Acute respiratory failure with hypoxia ([place]) 11/22/2017   Bowel obstruction ([place]) 08/23/2017   Colonic obstruction ([place]) 08/22/2017   Dehydration    Intractable vomiting with nausea    Hyperkalemia    Gastroenteritis 08/11/2017   Hypotension 08/11/2017   Acute renal failure superimposed on stage 3 chronic kidney disease ([place]) 08/11/2017   Hypovolemia 08/10/2017   Colostomy in place ([place]) 07/13/2017   Adjustment disorder with depressed mood    Debilitated 02/06/2017   S/P colostomy ([place]) 01/31/2017   Pressure injury of skin 01/28/2017   Colitis 01/26/2017   Acute MI ([place]) 05/05/2014   Anemia, iron deficiency 05/05/2014   Airway hyperreactivity 05/05/2014   Diabetes mellitus, type 2 ([place]) 12/87/8676   Chronic systolic heart failure ([place]) 05/05/2014   HLD  (hyperlipidemia) 05/05/2014   Hypothyroidism 05/05/2014   Nonischemic cardiomyopathy ([place]) 02/02/2014   LBBB (left bundle branch block) 72/03/4708   Chronic systolic dysfunction of left ventricle 02/02/2014   Chronic renal insufficiency 02/02/2014   Essential hypertension 02/02/2014   Morbid obesity ([place]) 02/02/2014   Coronary artery disease 08/15/2013   Shock ([place]) 08/15/2013   History of prolonged Q-T interval on ECG 10/31/2012   Hx of cardiomyopathy 10/31/2012   Automatic implantable cardioverter-defibrillator in situ 08/06/2008    REFERRING  DIAG: G57.02 (ICD-10-CM) - Piriformis syndrome of RIGHT side   THERAPY DIAG:  Pain in right hip  Decreased functional mobility and endurance  Other low back pain  Rationale for Evaluation and Treatment Rehabilitation  ONSET DATE: about 1 month ago   SUBJECTIVE:    SUBJECTIVE STATEMENT: Pt reports today is a good day. Overall, she is pleased with her progress.  PAIN:  Are you having pain? Yes: NPRS scale: 0-1/10 with walking; 3/10 with sitting Pain location: Rt buttock Pain description: gnawing pain  Aggravating factors: pressure Relieving factors: heating pad , laying down    PRECAUTIONS: None   WEIGHT BEARING RESTRICTIONS: No   FALLS:  Has patient fallen in last 6 months? No   LIVING ENVIRONMENT: Lives with: lives with their family Lives in: House/apartment Stairs: Yes: Internal: 13 steps; on right going up often crawls up there vs walking  Has following equipment at home: Single point cane, Walker - 2 wheeled, and bed side commode   OCCUPATION: RN Retired   PLOF: Independent with basic ADLs and Independent with household mobility with device   PATIENT GOALS: Patient wants to relieve this pain .    NEXT MD VISIT: Next week for L knee.    OBJECTIVE: (objective measures completed at initial evaluation unless otherwise dated)    DIAGNOSTIC FINDINGS: none    PATIENT SURVEYS:  FOTO 31%. 08/23/22=47% 09/01/22=53%    COGNITION: Overall cognitive status: Within functional limits for tasks assessed                         SENSATION: WFL   EDEMA:  NT   MUSCLE LENGTH: Hamstrings: Right tight, painful  deg; Left tight deg   POSTURE: rounded shoulders and flexed trunk    PALPATION: Patient with exquisite pain in the lower L 5 to S1, SIJ border extending laterally to outer hip.   LOWER EXTREMITY ROM:   Passive ROM Right eval Left eval  Hip flexion      Hip extension      Hip abduction      Hip adduction      Hip internal rotation 10-15 deg 15-20 deg   Hip external rotation good good  Knee flexion      Knee extension      Ankle dorsiflexion      Ankle plantarflexion      Ankle inversion      Ankle eversion       (Blank rows = not tested)   LOWER EXTREMITY MMT:   MMT Right eval Left eval  Hip flexion 4+/5 4/5  Hip extension      Hip abduction 3-/5 3-/5  Hip adduction      Hip internal rotation      Hip external rotation      Knee flexion 5/5 5/5  Knee extension 5/5 5/5  Ankle dorsiflexion      Ankle plantarflexion      Ankle inversion      Ankle eversion       (Blank rows = not tested)   LOWER EXTREMITY SPECIAL TESTS:  Hip special tests: NT due to pain    FUNCTIONAL TESTS:  5 times sit to stand: 24 sec 09/01/22=11.7" 2 minute walk test: NT on eval 08/17/22=340"   GAIT: Distance walked: 150 Assistive device utilized: Walker - 2 wheeled Level of assistance: SBA  Comments: walker pushed far out from  upper body     TODAY'S TREATMENT: [facility] Adult PT Treatment:  DATE: 12/31/22 Therapeutic Exercise: Nustep 5 min L6 UE/LE Supine Lower Trunk Rotation 5 reps - 10 hold Seated Hamstring Stretch 2 reps - 30 hold each Seated Piriformis Stretch  2 reps - 30 hold each Standing Hip abd 5# 2 sets - 10 reps - 3 hold each Standing knee flex 5# 2 sets - 10 reps - 3 hold each Manual Therapy: STM/DTM to the R gluteal, piriformis muscles,  proximal hamstring Skilled palpation to identify TrPs and taut muscle bands Therapeutic Activity: 5xSTS FOTO Trigger Point Dry Needling Treatment: Pre-treatment instruction: Patient instructed on dry needling rationale, procedures, and possible side effects including pain during treatment (achy,cramping feeling), bruising, drop of blood, lightheadedness, nausea, sweating. Patient Consent Given: Yes Education handout provided: Yes Muscles treated: R piriformis, Gluteal min. Gluteal med  Needle size and number: .30x153m x 1 Electrical stimulation performed: No Parameters: N/A Treatment response/outcome: Twitch response elicited Post-treatment instructions: Patient instructed to expect possible mild to moderate muscle soreness later today and/or tomorrow. Patient instructed in methods to reduce muscle soreness and to continue prescribed HEP. If patient was dry needled over the lung field, patient was instructed on signs and symptoms of pneumothorax and, however unlikely, to see immediate medical attention should they occur. Patient was also educated on signs and symptoms of infection and to seek medical attention should they occur. Patient verbalized understanding of these instructions and education.  [facility]:                                                DATE: 08/25/22 Therapeutic Exercise: Nustep 6 mins L5 UE/LE Supine Lower Trunk Rotation 5 reps - 10 hold Sit to stand s armrests c GTB lateral press 2 sets - 10 reps  SLS x5 30" each Standing Hip abd 5# 2 sets - 10 reps - 3 hold each Seated Hamstring Stretch 2 reps - 30 hold each Seated Piriformis Stretch  2 reps - 30 hold each Seated abdominal press x10 3" Manual Therapy: STM/DTM to the R gluteal and piriformis muscles Skilled palpation to identify TrPs and taut muscle bands Trigger Point Dry Needling Treatment: Pre-treatment instruction: Patient instructed on dry needling rationale, procedures, and possible side effects  including pain during treatment (achy,cramping feeling), bruising, drop of blood, lightheadedness, nausea, sweating. Patient Consent Given: Yes Education handout provided: Yes Muscles treated: R piriformis, Gluteal min. Gluteal med  Needle size and number: .30x104mx 1 Electrical stimulation performed: No Parameters: N/A Treatment response/outcome: Twitch response elicited Post-treatment instructions: Patient instructed to expect possible mild to moderate muscle soreness later today and/or tomorrow. Patient instructed in methods to reduce muscle soreness and to continue prescribed HEP. If patient was dry needled over the lung field, patient was instructed on signs and symptoms of pneumothorax and, however unlikely, to see immediate medical attention should they occur. Patient was also educated on signs and symptoms of infection and to seek medical attention should they occur. Patient verbalized understanding of these instructions and education.   [facility]                                                [facility]: [facility] Therapeutic Exercise: Nustep 5 mins L6 UE/LE Supine thomas stretch x3  20" Supine Bridge c GTB lateral press 2 sets - 10 reps - 5 hold Standing hip ext x10 each Standing knee flexion x10 Sit to stand x10 Seated Hamstring Stretch 2 reps - 30 hold each Seated Piriformis Stretch  2 reps - 30 hold each Manual Therapy: TTP to the R ischial tuberosity area- pt identifies as her current area of pain  [facility] Adult PT Treatment:                                                DATE: 08/25/22 Therapeutic Exercise: Nustep 6 mins L5 UE/LE Supine Lower Trunk Rotation 5 reps - 10 hold Sit to stand s armrests c GTB lateral press 2 sets - 10 reps  SLS x5 30" each Standing Hip abd 5# 2 sets - 10 reps - 3 hold each Seated Hamstring Stretch 2 reps - 30 hold each Seated Piriformis Stretch  2 reps - 30 hold each Seated abdominal press x10 3" Manual Therapy: STM/DTM to the R gluteal  and piriformis muscles Skilled palpation to identify TrPs and taut muscle bands Trigger Point Dry Needling Treatment: Pre-treatment instruction: Patient instructed on dry needling rationale, procedures, and possible side effects including pain during treatment (achy,cramping feeling), bruising, drop of blood, lightheadedness, nausea, sweating. Patient Consent Given: Yes Education handout provided: Yes Muscles treated: R piriformis, Gluteal min. Gluteal med  Needle size and number: .30x135m x 1 Electrical stimulation performed: No Parameters: N/A Treatment response/outcome: Twitch response elicited Post-treatment instructions: Patient instructed to expect possible mild to moderate muscle soreness later today and/or tomorrow. Patient instructed in methods to reduce muscle soreness and to continue prescribed HEP. If patient was dry needled over the lung field, patient was instructed on signs and symptoms of pneumothorax and, however unlikely, to see immediate medical attention should they occur. Patient was also educated on signs and symptoms of infection and to seek medical attention should they occur. Patient verbalized understanding of these instructions and education.   [facility]:                                                DATE: 08/23/22 Therapeutic Exercise: Nustep 5 mins L6 UE/LE Seated Hamstring Stretch 2 reps - 30 hold each Seated Piriformis Stretch  2 reps - 30 hold each Seated abdominal press x10 3" Supine Lower Trunk Rotation 5 reps - 5 hold Supine Bridge c GTB lateral press 2 sets - 10 reps - 5 hold Hooklying Clamshell GTB 2 sets - 10 reps - 5 hold Hook lying banded marching 2x10 SLR c quad set x15 each SL Hip abd x15 each                                                            PATIENT EDUCATION:  Education details: POC, HEP, differential diagnosis  Person educated: Patient Education method: ECustomer service managerEducation comprehension: verbalized  understanding   HOME EXERCISE PROGRAM: Access [facility]: [email or web address] HospitalURL: [email or web address] Date: 08/23/2022 Prepared by: AGar Ponto Exercises - Supine Lower Trunk  Rotation  - 1 x daily - 7 x weekly - 2 sets - 10 reps - 10 hold - Seated Hamstring Stretch  - 1 x daily - 7 x weekly - 1 sets - 3 reps - 30 hold - Seated Piriformis Stretch  - 1 x daily - 7 x weekly - 1 sets - 3 reps - 20-30 hold - Standing Hip Flexor Stretch  - 1 x daily - 7 x weekly - 1 sets - 3 reps - 20-30 hold - Bridge with Hip Abduction and Resistance  - 1 x daily - 7 x weekly - 2 sets - 10 reps - 5 hold - Clamshell with Resistance  - 1 x daily - 7 x weekly - 2 sets - 10 reps - 5 hold - Sidelying Hip Abduction  - 1 x daily - 7 x weekly - 2 sets - 10 reps - 5 hold - Active Straight Leg Raise with Quad Set  - 1 x daily - 7 x weekly - 2 sets - 10 reps - 5 hold   ASSESSMENT:   CLINICAL IMPRESSION: Pt presents to PT walking with a small base SCP. 5xSTS and FOTO were re-assessed and both were found to be improved. PT was continued for manual therapy and TPDN to the R piriformis and gluteal muscles, Additionally, STM/DTM was completed to the R proximal hamstring. Pt then completed flexibility and strengthening therex for the R hip with emphasis on the piriformis and hamstring muscles. Pt is making appropriate progress re: both pain and function. Pt tolerated PT today without adverse effects. Pt will continue to benefit from skilled PT to address impairments for improved function with less pain.  OBJECTIVE IMPAIRMENTS: cardiopulmonary status limiting activity, decreased activity tolerance, decreased endurance, decreased mobility, difficulty walking, decreased ROM, decreased strength, increased fascial restrictions, postural dysfunction, obesity, and pain.    ACTIVITY LIMITATIONS: carrying, lifting, bending, sitting, standing, squatting, sleeping, stairs, transfers, bed mobility, and locomotion level   PARTICIPATION  LIMITATIONS: shopping and community activity   PERSONAL FACTORS: Age, Fitness, and 3+ comorbidities: Polyarthralgia, obesity, defibrillator, diabetes  are also affecting patient's functional outcome.    REHAB POTENTIAL: Good   CLINICAL DECISION MAKING: Stable/uncomplicated   EVALUATION COMPLEXITY: Low     GOALS: Goals reviewed with patient? Yes   SHORT TERM GOALS: Target date: 09/06/2022   Patient will be independent in initial home exercise program Baseline: Goal status:  MET   2.  Patient will be able to sit more symmetrically on the right hip to develop normal movement patterns Baseline:  Goal status: MET   3.  Patient will complete functional testing goals set (2-minute walk, TUG) Baseline:  Goal status: MET   4.  Patient will begin to tolerate standing for up to 10 minutes to positively impact ADLs and quality of life Baseline:  Status: 08/23/22=pt estimates 10 mins Goal status: MET    LONG TERM GOALS: Target date: 10/04/2022     Patient will improve FOTO score to 50% or greater to demonstrate improved functional mobility Baseline: 31% Goal status: Met 53% 09/01/22   2.  Patient will perform bed mobility without increased back and hip pain >50% of the time  Baseline:  Goal status: INITIAL   3.  Patient will be able to walk up stairs with modified independence using 1 rail, preferred pattern Baseline: often uses hands on step above to crawl  Goal status: INITIAL   4.  2 min walk test TBA /other goals  Baseline:  Goal status:  INITIAL   5.  Patient will be independent with home exercise program upon discharge Baseline:  Goal status: INITIAL       PLAN:   PT FREQUENCY: 2x/week   PT DURATION: 8 weeks   PLANNED INTERVENTIONS: Therapeutic exercises, Therapeutic activity, Neuromuscular re-education, Balance training, Gait training, Patient/Family education, Self Care, Joint mobilization, Stair training, Aquatic Therapy, Cryotherapy, Moist heat, Ionotophoresis  '4mg'$ /ml Dexamethasone, Manual therapy, and Re-evaluation   PLAN FOR NEXT SESSION: 2-minute walk test.  NuStep.  Check home exercise program.  Consider manual therapy in side-lying to right hip/trunk   Gar Ponto MS, PT 09/01/22 1:32 PM

## 2022-09-14 ENCOUNTER — Ambulatory Visit: Payer: Medicare Other | Admitting: Physical Therapy

## 2022-09-14 ENCOUNTER — Encounter: Payer: Self-pay | Admitting: Physical Therapy

## 2022-09-14 DIAGNOSIS — M5459 Other low back pain: Secondary | ICD-10-CM | POA: Diagnosis not present

## 2022-09-14 DIAGNOSIS — Z7409 Other reduced mobility: Secondary | ICD-10-CM

## 2022-09-14 DIAGNOSIS — M25551 Pain in right hip: Secondary | ICD-10-CM

## 2022-09-14 NOTE — Therapy (Signed)
OUTPATIENT PHYSICAL THERAPY TREATMENT NOTE   Patient Name: Crystal [name] MRN: [id number] DOB:[birth year], 72 y.o., female Today's Date: 09/14/2022  PCP: Crist Infante MD  REFERRING PROVIDER: Alysia Penna MD    END OF SESSION:   PT End of Session - 09/14/22 1020     Visit Number 9    Number of Visits 16    Date for PT Re-Evaluation 10/04/22    Authorization Type Medicare- needs progress note    Progress Note Due on Visit 10    PT Start Time 1019    PT Stop Time 1058    PT Time Calculation (min) 39 min                 Past Medical History:  Diagnosis Date   AICD (automatic cardioverter/defibrillator) present    high RV threshold chronically, device was turned off in 2014; Device battery has been dead x 7 years; "turned it back on 06/22/2018"   Anemia, iron deficiency    Angioedema    felt to likely be due to ace inhibitors but says she has had this even off of medicines, appears to be tolerating ARBs chronically   Anxiety    Arthritis    "hands, legs, arms; bad in my back" (06/22/2018)   Asthmatic bronchitis with status asthmaticus    Bradycardia    CHF (congestive heart failure) (HCC)    Chronic lower back pain    Chronic pain    CKD (chronic kidney disease), stage III ([place])    Coronary artery disease    Mild nonobstructive (30% LAD, 30% RCA) by 09/01/16 cath at [facility]   Degenerative joint disease    Depression    Diabetes mellitus, type 2 ([place])    Diabetic peripheral neuropathy ([place])    Dizziness    Dyspnea on exertion    Family history of adverse reaction to anesthesia    Mother has nausea   GERD (gastroesophageal reflux disease)    Gout    Heart valve disorder    History of blood transfusion 1981; ~ 2005; 12/2016   "childbirth; defibrillator OR; colostomy OR"   History of mononucleosis 03/2014   Hyperlipidemia    Hypertension    Hypothyroid    Insomnia    Intervertebral disc degeneration    Ischemic cardiomyopathy    LBBB (left  bundle branch block)    Myocardial infarction ([place]) dx'd ~ 2005   Nonischemic cardiomyopathy ([place])    s/p ICD in 2005. EF has since recovered   Obesity    On home oxygen therapy    "2L at night" (06/22/2018)   OSA on CPAP    Pneumonia    "couple times; last time was 12/2016" (06/22/2018)   PONV (postoperative nausea and vomiting)    Presence of permanent cardiac pacemaker    [employer]   Swelling    Syncope    Systemic hypertension    Vitamin D deficiency    Past Surgical History:  Procedure Laterality Date   APPENDECTOMY  07/13/2017   BLADDER SUSPENSION  1990   "w/hysterectomy"   BOWEL RESECTION N/A 07/09/2018   Procedure: SMALL BOWEL RESECTION;  Surgeon: Judeth Horn, MD;  Location: [place];  Service: General;  Laterality: N/A;   CARDIAC CATHETERIZATION  2005   no obstructive CAD per patient   CARDIAC CATHETERIZATION  2018   CARDIAC DEFIBRILLATOR PLACEMENT  2005   BiV ICD implanted,  LV lead is an epicardial lead   CARPAL TUNNEL RELEASE Left 07/25/2014  Dr.Williamson    CARPAL TUNNEL RELEASE Right 04/08/2019   Procedure: RIGHT CARPAL TUNNEL RELEASE ENDOSCOPIC;  Surgeon: Milly Jakob, MD;  Location: [place];  Service: Orthopedics;  Laterality: Right;   CATARACT EXTRACTION W/ INTRAOCULAR LENS  IMPLANT, BILATERAL Bilateral    COLONIC STENT PLACEMENT N/A 08/31/2017   Procedure: COLONIC STENT PLACEMENT;  Surgeon: Carol Ada, MD;  Location: [place];  Service: Endoscopy;  Laterality: N/A;   COLONOSCOPY     2010-2011 Dr.Kipreos    COLOSTOMY  12/2016   Archie Endo 01/26/2017   COLOSTOMY REVERSAL N/A 07/13/2017   Procedure: COLOSTOMY REVERSAL;  Surgeon: Judeth Horn, MD;  Location: [place];  Service: General;  Laterality: N/A;   CYST REMOVAL HAND Right 05/2013   thumb   DILATION AND CURETTAGE OF UTERUS  X 5-6   ESOPHAGOGASTRODUODENOSCOPY (EGD) WITH PROPOFOL N/A 03/13/2020   Procedure: ESOPHAGOGASTRODUODENOSCOPY (EGD) WITH PROPOFOL;  Surgeon: Carol Ada, MD;  [facility];  Service: Endoscopy;  Laterality: N/A;   EXCISION MASS ABDOMINAL N/A 07/09/2018   Procedure: EXPLORATION OF  ABDOMINAL WOUND ERAS PATHWAY;  Surgeon: Judeth Horn, MD;  Location: [place];  Service: General;  Laterality: N/A;   FLEXIBLE SIGMOIDOSCOPY N/A 08/24/2017   Procedure: Beryle Quant;  Surgeon: Carol Ada, MD;  Location: [place];  Service: Endoscopy;  Laterality: N/A;   FLEXIBLE SIGMOIDOSCOPY N/A 08/31/2017   Procedure: FLEXIBLE SIGMOIDOSCOPY;  Surgeon: Carol Ada, MD;  Location: [place];  Service: Endoscopy;  Laterality: N/A;  stent placement   FLEXIBLE SIGMOIDOSCOPY N/A 03/13/2020   Procedure: FLEXIBLE SIGMOIDOSCOPY;  Surgeon: Carol Ada, MD;  [facility];  Service: Endoscopy;  Laterality: N/A;   FLEXIBLE SIGMOIDOSCOPY N/A 12/18/2020   Procedure: FLEXIBLE SIGMOIDOSCOPY;  Surgeon: Carol Ada, MD;  [facility];  Service: Endoscopy;  Laterality: N/A;   HEMOSTASIS CLIP PLACEMENT  03/13/2020   Procedure: HEMOSTASIS CLIP PLACEMENT;  Surgeon: Carol Ada, MD;  [facility];  Service: Endoscopy;;   HEMOSTASIS CLIP PLACEMENT  12/18/2020   Procedure: HEMOSTASIS CLIP PLACEMENT;  Surgeon: Carol Ada, MD;  [facility];  Service: Endoscopy;;   HOT HEMOSTASIS N/A 03/13/2020   Procedure: HOT HEMOSTASIS (ARGON PLASMA COAGULATION/BICAP);  Surgeon: Carol Ada, MD;  [facility];  Service: Endoscopy;  Laterality: N/A;   HOT HEMOSTASIS N/A 12/18/2020   Procedure: HOT HEMOSTASIS (ARGON PLASMA COAGULATION/BICAP);  Surgeon: Carol Ada, MD;  [facility];  Service: Endoscopy;  Laterality: N/A;   IMPLANTABLE CARDIOVERTER DEFIBRILLATOR GENERATOR CHANGE  2008   INSERTION OF MESH N/A 07/09/2018   Procedure: INSERTION OF VICRYL MESH;  Surgeon: Judeth Horn, MD;  Location: [facility];  Service: General;  Laterality: N/A;   LAPAROTOMY N/A 09/18/2017   Procedure: EXPLORATORY LAPAROTOMY;  Surgeon: Judeth Horn, MD;   Location: [facility];  Service: General;  Laterality: N/A;   LEAD REVISION  06/22/2018   LEAD REVISION/REPAIR N/A 06/22/2018   Procedure: LEAD REVISION/REPAIR;  Surgeon: Deboraha Sprang, MD;  Location: [facility];  Service: Cardiovascular;  Laterality: N/A;   LYSIS OF ADHESION N/A 09/18/2017   Procedure: LYSIS OF ADHESION;  Surgeon: Judeth Horn, MD;  Location: [facility];  Service: General;  Laterality: N/A;   LYSIS OF ADHESION N/A 07/09/2018   Procedure: LYSIS OF ADHESION;  Surgeon: Judeth Horn, MD;  Location: [facility];  Service: General;  Laterality: N/A;   PARTIAL COLECTOMY N/A 09/18/2017   Procedure: ILEOCOLECTOMY;  Surgeon: Judeth Horn, MD;  Location: [facility];  Service: General;  Laterality: N/A;   PARTIAL COLECTOMY  09/25/2017   POLYPECTOMY  03/13/2020   Procedure: POLYPECTOMY;  Surgeon: Carol Ada, MD;  [facility];  Service: Endoscopy;;   RIGHT/LEFT HEART CATH AND CORONARY ANGIOGRAPHY N/A 06/01/2018   Procedure: RIGHT/LEFT HEART CATH AND CORONARY ANGIOGRAPHY;  Surgeon: Jettie Booze, MD;  Location: [place] [facility];  Service: Cardiovascular;  Laterality: N/A;   SIGMOIDOSCOPY N/A 09/18/2017   Procedure: SIGMOIDOSCOPY;  Surgeon: Judeth Horn, MD;  Location: [place];  Service: General;  Laterality: N/A;   SMALL INTESTINE SURGERY  07/09/2018   EXPLORATION OF  ABDOMINAL WOUND ERAS PATHWAYN/; MESH; LYSIS OF ADHESIONS   TOTAL ABDOMINAL HYSTERECTOMY  1990   TRANSFORAMINAL LUMBAR INTERBODY FUSION (TLIF) WITH PEDICLE SCREW FIXATION 2 LEVEL N/A 01/07/2020   Procedure: Lumbar Four-Five and Lumbar Five-Sacral One open lumbar decompression and transforaminal lumbar interbody fusion;  Surgeon: Judith Part, MD;  Location: [place];  Service: Neurosurgery;  Laterality: N/A;  Lumbar Four-Five and Lumbar Five-Sacral One open lumbar decompression and transforaminal lumbar interbody fusion   TRIGGER FINGER RELEASE Right 05/213   TRIGGER FINGER RELEASE Right 04/08/2019   Procedure: RIGHT  INDEX RELEASE TRIGGER FINGER/A-1 PULLEY;  Surgeon: Milly Jakob, MD;  Location: [place];  Service: Orthopedics;  Laterality: Right;   TUBAL LIGATION  1980s   VESICOVAGINAL FISTULA CLOSURE W/ TAH     Patient Active Problem List   Diagnosis Date Noted   Hypermagnesemia 10/27/2021   Diabetes mellitus without complication ([place]) [id number]   Hav (hallux abducto valgus), unspecified laterality 06/26/2020   Lumbar stenosis with neurogenic claudication 01/07/2020   Generalized abdominal pain    CKD (chronic kidney disease) stage 4, GFR 15-29 ml/min (HCC)    SBO (small bowel obstruction) (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   Congestive heart failure (CHF) ([place]) [id number]   Acute systolic heart failure ([place])    Type 2 diabetes mellitus with stage 3 chronic kidney disease, with long-term current use of insulin ([place]) 11/22/2017   Acute respiratory failure with hypoxia ([place]) 11/22/2017   Bowel obstruction ([place]) 08/23/2017   Colonic obstruction ([place]) 08/22/2017   Dehydration    Intractable vomiting with nausea    Hyperkalemia    Gastroenteritis 08/11/2017   Hypotension 08/11/2017   Acute renal failure superimposed on stage 3 chronic kidney disease ([place]) 08/11/2017   Hypovolemia 08/10/2017   Colostomy in place ([place]) 07/13/2017   Adjustment disorder with depressed mood    Debilitated 02/06/2017   S/P colostomy ([place]) 01/31/2017   Pressure injury of skin 01/28/2017   Colitis 01/26/2017   Acute MI ([place]) 05/05/2014   Anemia, iron deficiency 05/05/2014   Airway hyperreactivity 05/05/2014   Diabetes mellitus, type 2 ([place]) [id number]   Chronic systolic heart failure ([place]) 05/05/2014   HLD (hyperlipidemia) 05/05/2014   Hypothyroidism 05/05/2014    Nonischemic cardiomyopathy ([place]) 02/02/2014   LBBB (left bundle branch block) [id number]   Chronic systolic dysfunction of left ventricle 02/02/2014   Chronic renal insufficiency 02/02/2014   Essential hypertension 02/02/2014   Morbid obesity ([place]) 02/02/2014   Coronary artery disease 08/15/2013   Shock ([place]) 08/15/2013   History of prolonged Q-T interval on ECG 10/31/2012   Hx of cardiomyopathy 10/31/2012   Automatic implantable cardioverter-defibrillator in situ 08/06/2008    REFERRING DIAG: G57.02 (ICD-10-CM) - Piriformis syndrome of  RIGHT side   THERAPY DIAG:  Pain in right hip  Decreased functional mobility and endurance  Other low back pain  Rationale for Evaluation and Treatment Rehabilitation  ONSET DATE: about 1 month ago   SUBJECTIVE:    SUBJECTIVE STATEMENT: No more pain with walking. Just a little with sitting but if I put something under my buttock it goes away.   PAIN:  Are you having pain? Yes: NPRS scale: 0/10 with walking; 3/10 with sitting Pain location: Rt buttock Pain description: gnawing pain  Aggravating factors: pressure Relieving factors: heating pad , laying down    PRECAUTIONS: None   WEIGHT BEARING RESTRICTIONS: No   FALLS:  Has patient fallen in last 6 months? No   LIVING ENVIRONMENT: Lives with: lives with their family Lives in: House/apartment Stairs: Yes: Internal: 13 steps; on right going up often crawls up there vs walking  Has following equipment at home: Single point cane, Walker - 2 wheeled, and bed side commode   OCCUPATION: RN Retired   PLOF: Independent with basic ADLs and Independent with household mobility with device   PATIENT GOALS: Patient wants to relieve this pain .    NEXT MD VISIT: Next week for L knee.    OBJECTIVE: (objective measures completed at initial evaluation unless otherwise dated)    DIAGNOSTIC FINDINGS: none    PATIENT SURVEYS:  FOTO 31%. 08/23/22=47% 09/01/22=53%   COGNITION: Overall cognitive  status: Within functional limits for tasks assessed                         SENSATION: WFL   EDEMA:  NT   MUSCLE LENGTH: Hamstrings: Right tight, painful  deg; Left tight deg   POSTURE: rounded shoulders and flexed trunk    PALPATION: Patient with exquisite pain in the lower L 5 to S1, SIJ border extending laterally to outer hip.   LOWER EXTREMITY ROM:   Passive ROM Right eval Left eval  Hip flexion      Hip extension      Hip abduction      Hip adduction      Hip internal rotation 10-15 deg 15-20 deg   Hip external rotation good good  Knee flexion      Knee extension      Ankle dorsiflexion      Ankle plantarflexion      Ankle inversion      Ankle eversion       (Blank rows = not tested)   LOWER EXTREMITY MMT:   MMT Right eval Left eval  Hip flexion 4+/5 4/5  Hip extension      Hip abduction 3-/5 3-/5  Hip adduction      Hip internal rotation      Hip external rotation      Knee flexion 5/5 5/5  Knee extension 5/5 5/5  Ankle dorsiflexion      Ankle plantarflexion      Ankle inversion      Ankle eversion       (Blank rows = not tested)   LOWER EXTREMITY SPECIAL TESTS:  Hip special tests: NT due to pain    FUNCTIONAL TESTS:  5 times sit to stand: 24 sec 09/01/22=11.7" 2 minute walk test: NT on eval 08/17/22=340"   GAIT: Distance walked: 150 Assistive device utilized: Walker - 2 wheeled Level of assistance: SBA  Comments: walker pushed far out from  upper body     TODAY'S TREATMENT: [facility]  Treatment:                                                DATE: 09/14/22 Therapeutic Exercise: Nustep L5 x 5 minutes Seated hamstring and piriformis stretches Standing hip abduction 5# 10 x 2 each  Standing knee flexion 5# 10 x 2  Seated LAQ 5# x 20 each  Supine bridge x 10 Supine clam blue band bilateral and alternating Supine hamstring stretch right using strap  Therapeutic Activity: Step to pattern 8 stairs with 1UE -    [facility]  Treatment:                                                DATE: 09/01/22 Therapeutic Exercise: Nustep 5 min L6 UE/LE Supine Lower Trunk Rotation 5 reps - 10 hold Seated Hamstring Stretch 2 reps - 30 hold each Seated Piriformis Stretch  2 reps - 30 hold each Standing Hip abd 5# 2 sets - 10 reps - 3 hold each Standing knee flex 5# 2 sets - 10 reps - 3 hold each Manual Therapy: STM/DTM to the R gluteal, piriformis muscles, proximal hamstring Skilled palpation to identify TrPs and taut muscle bands Therapeutic Activity: 5xSTS FOTO Trigger Point Dry Needling Treatment: Pre-treatment instruction: Patient instructed on dry needling rationale, procedures, and possible side effects including pain during treatment (achy,cramping feeling), bruising, drop of blood, lightheadedness, nausea, sweating. Patient Consent Given: Yes Education handout provided: Yes Muscles treated: R piriformis, Gluteal min. Gluteal med  Needle size and number: .30x175m x 1 Electrical stimulation performed: No Parameters: N/A Treatment response/outcome: Twitch response elicited Post-treatment instructions: Patient instructed to expect possible mild to moderate muscle soreness later today and/or tomorrow. Patient instructed in methods to reduce muscle soreness and to continue prescribed HEP. If patient was dry needled over the lung field, patient was instructed on signs and symptoms of pneumothorax and, however unlikely, to see immediate medical attention should they occur. Patient was also educated on signs and symptoms of infection and to seek medical attention should they occur. Patient verbalized understanding of these instructions and education.  [facility]:                                                DATE: 08/25/22 Therapeutic Exercise: Nustep 6 mins L5 UE/LE Supine Lower Trunk Rotation 5 reps - 10 hold Sit to stand s armrests c GTB lateral press 2 sets - 10 reps  SLS x5 30" each Standing Hip abd 5# 2  sets - 10 reps - 3 hold each Seated Hamstring Stretch 2 reps - 30 hold each Seated Piriformis Stretch  2 reps - 30 hold each Seated abdominal press x10 3" Manual Therapy: STM/DTM to the R gluteal and piriformis muscles Skilled palpation to identify TrPs and taut muscle bands Trigger Point Dry Needling Treatment: Pre-treatment instruction: Patient instructed on dry needling rationale, procedures, and possible side effects including pain during treatment (achy,cramping feeling), bruising, drop of blood, lightheadedness, nausea, sweating. Patient Consent Given: Yes Education handout provided: Yes Muscles treated: R piriformis, Gluteal min. Gluteal med  Needle size and number: .30x179m x 1 Electrical stimulation performed: No Parameters: N/A Treatment response/outcome: Twitch response elicited Post-treatment instructions: Patient instructed to expect possible mild to moderate muscle soreness later today and/or tomorrow. Patient instructed in methods to reduce muscle soreness and to continue prescribed HEP. If patient was dry needled over the lung field, patient was instructed on signs and symptoms of pneumothorax and, however unlikely, to see immediate medical attention should they occur. Patient was also educated on signs and symptoms of infection and to seek medical attention should they occur. Patient verbalized understanding of these instructions and education.   [facility] Treatment:                                                DATE: 08/30/22 Therapeutic Exercise: Nustep 5 mins L6 UE/LE Supine thomas stretch x3 20" Supine Bridge c GTB lateral press 2 sets - 10 reps - 5 hold Standing hip ext x10 each Standing knee flexion x10 Sit to stand x10 Seated Hamstring Stretch 2 reps - 30 hold each Seated Piriformis Stretch  2 reps - 30 hold each Manual Therapy: TTP to the R ischial tuberosity area- pt identifies as her current area of pain                                                                 PATIENT EDUCATION:  Education details: POC, HEP, differential diagnosis  Person educated: Patient Education method: ECustomer service managerEducation comprehension: verbalized understanding   HOME EXERCISE PROGRAM: [email or web address]URL: [email or web address] Date: 08/23/2022 Prepared by: AGar Ponto Exercises - Supine Lower Trunk Rotation  - 1 x daily - 7 x weekly - 2 sets - 10 reps - 10 hold - Seated Hamstring Stretch  - 1 x daily - 7 x weekly - 1 sets - 3 reps - 30 hold - Seated Piriformis Stretch  - 1 x daily - 7 x weekly - 1 sets - 3 reps - 20-30 hold - Standing Hip Flexor Stretch  - 1 x daily - 7 x weekly - 1 sets - 3 reps - 20-30 hold - Bridge with Hip Abduction and Resistance  - 1 x daily - 7 x weekly - 2 sets - 10 reps - 5 hold - Clamshell with Resistance  - 1 x daily - 7 x weekly - 2 sets - 10 reps - 5 hold - Sidelying Hip Abduction  - 1 x daily - 7 x weekly - 2 sets - 10 reps - 5 hold - Active Straight Leg Raise with Quad Set  - 1 x daily - 7 x weekly - 2 sets - 10 reps - 5 hold   ASSESSMENT:   CLINICAL IMPRESSION: Pt presents to PT walking with RW and reports improvement since last session with TPDN. She no longer has pain with walking. She does have min pain with sitting, right buttock. Session focused on flexibility and strengthening therex for the R hip with emphasis on the piriformis and hamstring muscles. She was able to ascend and descend clinic stairs with step to pattern and 1 UE,good safety. Pt tolerated PT today without  adverse effects. Pt will continue to benefit from skilled PT to address impairments for improved function with less pain. Needs progress note next.   OBJECTIVE IMPAIRMENTS: cardiopulmonary status limiting activity, decreased activity tolerance, decreased endurance, decreased mobility, difficulty walking, decreased ROM, decreased strength, increased fascial restrictions, postural dysfunction, obesity, and pain.     ACTIVITY LIMITATIONS: carrying, lifting, bending, sitting, standing, squatting, sleeping, stairs, transfers, bed mobility, and locomotion level   PARTICIPATION LIMITATIONS: shopping and community activity   PERSONAL FACTORS: Age, Fitness, and 3+ comorbidities: Polyarthralgia, obesity, defibrillator, diabetes  are also affecting patient's functional outcome.    REHAB POTENTIAL: Good   CLINICAL DECISION MAKING: Stable/uncomplicated   EVALUATION COMPLEXITY: Low     GOALS: Goals reviewed with patient? Yes   SHORT TERM GOALS: Target date: 09/06/2022   Patient will be independent in initial home exercise program Baseline: Goal status:  MET   2.  Patient will be able to sit more symmetrically on the right hip to develop normal movement patterns Baseline:  Goal status: MET   3.  Patient will complete functional testing goals set (2-minute walk, TUG) Baseline:  Goal status: MET   4.  Patient will begin to tolerate standing for up to 10 minutes to positively impact ADLs and quality of life Baseline:  Status: 08/23/22=pt estimates 10 mins Goal status: MET    LONG TERM GOALS: Target date: 10/04/2022     Patient will improve FOTO score to 50% or greater to demonstrate improved functional mobility Baseline: 31% Goal status: Met 53% 09/01/22   2.  Patient will perform bed mobility without increased back and hip pain >50% of the time  Baseline:  09/14/22: pain with bed mobility in clinic Goal status: ONGOING  3.  Patient will be able to walk up stairs with modified independence using 1 rail, preferred pattern/ Baseline: often uses hands on step above to crawl  09/14/22: can perform step to pattern, 1 rail in clinic, has not tried at home Goal status: PARTIALLY MET   4.  2 min walk test TBA /other goals  Baseline:  Goal status: INITIAL   5.  Patient will be independent with home exercise program upon discharge Baseline:  Goal status: INITIAL       PLAN:   PT FREQUENCY:  2x/week   PT DURATION: 8 weeks   PLANNED INTERVENTIONS: Therapeutic exercises, Therapeutic activity, Neuromuscular re-education, Balance training, Gait training, Patient/Family education, Self Care, Joint mobilization, Stair training, Aquatic Therapy, Cryotherapy, Moist heat, Ionotophoresis 45m/ml Dexamethasone, Manual therapy, and Re-evaluation   PLAN FOR NEXT SESSION: Progress note; 2-minute walk test.  NuStep.  Check home exercise program.  Consider manual therapy in side-lying to right hip/trunk   JHessie Diener PTA 09/14/22 11:09 AM Phone: [phone]: [phone]

## 2022-09-15 ENCOUNTER — Encounter: Payer: Medicare Other | Admitting: Registered Nurse

## 2022-09-16 ENCOUNTER — Ambulatory Visit: Payer: Self-pay

## 2022-09-16 ENCOUNTER — Ambulatory Visit: Payer: Medicare Other

## 2022-09-16 NOTE — Patient Outreach (Signed)
  Care Coordination   09/16/2022 Name: Crystal [name] MRN: [id number] DOB: 02-11-1951   Care Coordination Outreach Attempts:  An unsuccessful telephone outreach was attempted for a scheduled appointment today.  Follow Up Plan:  Additional outreach attempts will be made to offer the patient care coordination information and services.   Encounter Outcome:  No Answer   Care Coordination Interventions:  No, not indicated    Barb Merino, RN, BSN, CCM Care Management Coordinator [facility] Care Management  Direct Phone: [phone]

## 2022-09-20 ENCOUNTER — Ambulatory Visit: Payer: Medicare Other

## 2022-09-20 DIAGNOSIS — Z7409 Other reduced mobility: Secondary | ICD-10-CM

## 2022-09-20 DIAGNOSIS — M25551 Pain in right hip: Secondary | ICD-10-CM | POA: Diagnosis not present

## 2022-09-20 DIAGNOSIS — M5459 Other low back pain: Secondary | ICD-10-CM

## 2022-09-20 NOTE — Therapy (Signed)
OUTPATIENT PHYSICAL THERAPY TREATMENT NOTE/Progress Note   Patient Name: Crystal [name] MRN: [id number] DOB:[birth year], 72 y.o., female Today's Date: 09/20/2022  PCP: Crist Infante MD  REFERRING PROVIDER: Alysia Penna MD    Progress Note Reporting Period 08/09/22 to 09/20/12  See note below for Objective Data and Assessment of Progress/Goals.      END OF SESSION:   PT End of Session - 09/20/22 1028     Visit Number 10    Number of Visits 16    Date for PT Re-Evaluation 10/04/22    Authorization Type Medicare- needs progress note    Progress Note Due on Visit 10    PT Start Time 1025    PT Stop Time 1110    PT Time Calculation (min) 45 min    Equipment Utilized During Treatment Other (comment)   RW   Activity Tolerance Patient tolerated treatment well    Behavior During Therapy WFL for tasks assessed/performed                 Past Medical History:  Diagnosis Date   AICD (automatic cardioverter/defibrillator) present    high RV threshold chronically, device was turned off in 2014; Device battery has been dead x 7 years; "turned it back on 06/22/2018"   Anemia, iron deficiency    Angioedema    felt to likely be due to ace inhibitors but says she has had this even off of medicines, appears to be tolerating ARBs chronically   Anxiety    Arthritis    "hands, legs, arms; bad in my back" (06/22/2018)   Asthmatic bronchitis with status asthmaticus    Bradycardia    CHF (congestive heart failure) (HCC)    Chronic lower back pain    Chronic pain    CKD (chronic kidney disease), stage III (HCC)    Coronary artery disease    Mild nonobstructive (30% LAD, 30% RCA) by 09/01/16 cath at [facility]   Degenerative joint disease    Depression    Diabetes mellitus, type 2 ([place])    Diabetic peripheral neuropathy ([place])    Dizziness    Dyspnea on exertion    Family history of adverse reaction to anesthesia    Mother has nausea   GERD (gastroesophageal reflux  disease)    Gout    Heart valve disorder    History of blood transfusion 1981; ~ 2005; 12/2016   "childbirth; defibrillator OR; colostomy OR"   History of mononucleosis 03/2014   Hyperlipidemia    Hypertension    Hypothyroid    Insomnia    Intervertebral disc degeneration    Ischemic cardiomyopathy    LBBB (left bundle branch block)    Myocardial infarction ([place]) dx'd ~ 2005   Nonischemic cardiomyopathy ([place])    s/p ICD in 2005. EF has since recovered   Obesity    On home oxygen therapy    "2L at night" (06/22/2018)   OSA on CPAP    Pneumonia    "couple times; last time was 12/2016" (06/22/2018)   PONV (postoperative nausea and vomiting)    Presence of permanent cardiac pacemaker    [employer]   Swelling    Syncope    Systemic hypertension    Vitamin D deficiency    Past Surgical History:  Procedure Laterality Date   APPENDECTOMY  07/13/2017   BLADDER SUSPENSION  1990   "w/hysterectomy"   BOWEL RESECTION N/A 07/09/2018   Procedure: SMALL BOWEL RESECTION;  Surgeon: Judeth Horn, MD;  Location: [place] [facility];  Service: General;  Laterality: N/A;   CARDIAC CATHETERIZATION  2005   no obstructive CAD per patient   CARDIAC CATHETERIZATION  2018   CARDIAC DEFIBRILLATOR PLACEMENT  2005   BiV ICD implanted,  LV lead is an epicardial lead   CARPAL TUNNEL RELEASE Left 07/25/2014   Dr.Williamson    CARPAL TUNNEL RELEASE Right 04/08/2019   Procedure: RIGHT CARPAL TUNNEL RELEASE ENDOSCOPIC;  Surgeon: Milly Jakob, MD;  Location: [place];  Service: Orthopedics;  Laterality: Right;   CATARACT EXTRACTION W/ INTRAOCULAR LENS  IMPLANT, BILATERAL Bilateral    COLONIC STENT PLACEMENT N/A 08/31/2017   Procedure: COLONIC STENT PLACEMENT;  Surgeon: Carol Ada, MD;  Location: [place];  Service: Endoscopy;  Laterality: N/A;   COLONOSCOPY     2010-2011 Dr.Kipreos    COLOSTOMY  12/2016   Archie Endo 01/26/2017   COLOSTOMY REVERSAL N/A 07/13/2017   Procedure: COLOSTOMY REVERSAL;  Surgeon: Judeth Horn, MD;  Location: [place];  Service: General;  Laterality: N/A;   CYST REMOVAL HAND Right 05/2013   thumb   DILATION AND CURETTAGE OF UTERUS  X 5-6   ESOPHAGOGASTRODUODENOSCOPY (EGD) WITH PROPOFOL N/A 03/13/2020   Procedure: ESOPHAGOGASTRODUODENOSCOPY (EGD) WITH PROPOFOL;  Surgeon: Carol Ada, MD;  [facility];  Service: Endoscopy;  Laterality: N/A;   EXCISION MASS ABDOMINAL N/A 07/09/2018   Procedure: EXPLORATION OF  ABDOMINAL WOUND ERAS PATHWAY;  Surgeon: Judeth Horn, MD;  Location: [place];  Service: General;  Laterality: N/A;   FLEXIBLE SIGMOIDOSCOPY N/A 08/24/2017   Procedure: Beryle Quant;  Surgeon: Carol Ada, MD;  Location: [place];  Service: Endoscopy;  Laterality: N/A;   FLEXIBLE SIGMOIDOSCOPY N/A 08/31/2017   Procedure: FLEXIBLE SIGMOIDOSCOPY;  Surgeon: Carol Ada, MD;  Location: [place];  Service: Endoscopy;  Laterality: N/A;  stent placement   FLEXIBLE SIGMOIDOSCOPY N/A 03/13/2020   Procedure: FLEXIBLE SIGMOIDOSCOPY;  Surgeon: Carol Ada, MD;  [facility];  Service: Endoscopy;  Laterality: N/A;   FLEXIBLE SIGMOIDOSCOPY N/A 12/18/2020   Procedure: FLEXIBLE SIGMOIDOSCOPY;  Surgeon: Carol Ada, MD;  [facility];  Service: Endoscopy;  Laterality: N/A;   HEMOSTASIS CLIP PLACEMENT  03/13/2020   Procedure: HEMOSTASIS CLIP PLACEMENT;  Surgeon: Carol Ada, MD;  [facility];  Service: Endoscopy;;   HEMOSTASIS CLIP PLACEMENT  12/18/2020   Procedure: HEMOSTASIS CLIP PLACEMENT;  Surgeon: Carol Ada, MD;  [facility];  Service: Endoscopy;;   HOT HEMOSTASIS N/A 03/13/2020   Procedure: HOT HEMOSTASIS (ARGON PLASMA COAGULATION/BICAP);  Surgeon: Carol Ada, MD;  [facility];  Service: Endoscopy;  Laterality: N/A;   HOT HEMOSTASIS N/A 12/18/2020   Procedure: HOT HEMOSTASIS (ARGON PLASMA COAGULATION/BICAP);  Surgeon: Carol Ada, MD;  Location[facility];  Service: Endoscopy;  Laterality: N/A;    IMPLANTABLE CARDIOVERTER DEFIBRILLATOR GENERATOR CHANGE  2008   INSERTION OF MESH N/A 07/09/2018   Procedure: INSERTION OF VICRYL MESH;  Surgeon: Judeth Horn, MD;  Location: [place];  Service: General;  Laterality: N/A;   LAPAROTOMY N/A 09/18/2017   Procedure: EXPLORATORY LAPAROTOMY;  Surgeon: Judeth Horn, MD;  Location: [place];  Service: General;  Laterality: N/A;   LEAD REVISION  06/22/2018   LEAD REVISION/REPAIR N/A 06/22/2018   Procedure: LEAD REVISION/REPAIR;  Surgeon: Deboraha Sprang, MD;  Location: [place] [facility];  Service: Cardiovascular;  Laterality: N/A;   LYSIS OF ADHESION N/A 09/18/2017   Procedure: LYSIS OF ADHESION;  Surgeon: Judeth Horn, MD;  Location: [place];  Service: General;  Laterality: N/A;  LYSIS OF ADHESION N/A 07/09/2018   Procedure: LYSIS OF ADHESION;  Surgeon: Judeth Horn, MD;  Location: [place];  Service: General;  Laterality: N/A;   PARTIAL COLECTOMY N/A 09/18/2017   Procedure: ILEOCOLECTOMY;  Surgeon: Judeth Horn, MD;  Location: [place];  Service: General;  Laterality: N/A;   PARTIAL COLECTOMY  09/25/2017   POLYPECTOMY  03/13/2020   Procedure: POLYPECTOMY;  Surgeon: Carol Ada, MD;  [facility];  Service: Endoscopy;;   RIGHT/LEFT HEART CATH AND CORONARY ANGIOGRAPHY N/A 06/01/2018   Procedure: RIGHT/LEFT HEART CATH AND CORONARY ANGIOGRAPHY;  Surgeon: Jettie Booze, MD;  Location: [place] [facility];  Service: Cardiovascular;  Laterality: N/A;   SIGMOIDOSCOPY N/A 09/18/2017   Procedure: SIGMOIDOSCOPY;  Surgeon: Judeth Horn, MD;  Location: [place];  Service: General;  Laterality: N/A;   SMALL INTESTINE SURGERY  07/09/2018   EXPLORATION OF  ABDOMINAL WOUND ERAS PATHWAYN/; MESH; LYSIS OF ADHESIONS   TOTAL ABDOMINAL HYSTERECTOMY  1990   TRANSFORAMINAL LUMBAR INTERBODY FUSION (TLIF) WITH PEDICLE SCREW FIXATION 2 LEVEL N/A 01/07/2020   Procedure: Lumbar Four-Five and Lumbar Five-Sacral One open lumbar decompression and transforaminal lumbar interbody  fusion;  Surgeon: Judith Part, MD;  Location: [place];  Service: Neurosurgery;  Laterality: N/A;  Lumbar Four-Five and Lumbar Five-Sacral One open lumbar decompression and transforaminal lumbar interbody fusion   TRIGGER FINGER RELEASE Right 05/213   TRIGGER FINGER RELEASE Right 04/08/2019   Procedure: RIGHT INDEX RELEASE TRIGGER FINGER/A-1 PULLEY;  Surgeon: Milly Jakob, MD;  Location: [place];  Service: Orthopedics;  Laterality: Right;   TUBAL LIGATION  1980s   VESICOVAGINAL FISTULA CLOSURE W/ TAH     Patient Active Problem List   Diagnosis Date Noted   Hypermagnesemia 10/27/2021   Diabetes mellitus without complication ([place]) [id number]   Hav (hallux abducto valgus), unspecified laterality 06/26/2020   Lumbar stenosis with neurogenic claudication 01/07/2020   Generalized abdominal pain    CKD (chronic kidney disease) stage 4, GFR 15-29 ml/min (HCC)    SBO (small bowel obstruction) ([place]) 08/17/2019   Hyponatremia 08/17/2019   COVID-19 virus infection 08/17/2019   Acute lower UTI 08/17/2019   Prolonged QT interval 08/17/2019   Acute hypoxemic respiratory failure due to COVID-19 ([place]) 08/07/2019   Normocytic anemia 08/07/2019   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   Congestive heart failure (CHF) ([place]) [id number]   Acute systolic heart failure ([place])    Type 2 diabetes mellitus with stage 3 chronic kidney disease, with long-term current use of insulin ([place]) 11/22/2017   Acute respiratory failure with hypoxia ([place]) 11/22/2017   Bowel obstruction ([place]) 08/23/2017   Colonic obstruction ([place]) 08/22/2017   Dehydration    Intractable vomiting with nausea    Hyperkalemia    Gastroenteritis 08/11/2017   Hypotension 08/11/2017   Acute renal failure superimposed on stage 3 chronic kidney disease ([place]) 08/11/2017   Hypovolemia 08/10/2017   Colostomy in place ([place]) 07/13/2017   Adjustment disorder with depressed mood    Debilitated 02/06/2017   S/P colostomy ([place]) 01/31/2017   Pressure  injury of skin 01/28/2017   Colitis 01/26/2017   Acute MI ([place]) 05/05/2014   Anemia, iron deficiency 05/05/2014   Airway hyperreactivity 05/05/2014   Diabetes mellitus, type 2 ([place]) [id number]   Chronic systolic heart failure ([place]) 05/05/2014   HLD (hyperlipidemia) 05/05/2014   Hypothyroidism 05/05/2014   Nonischemic cardiomyopathy ([place]) 02/02/2014   LBBB (left bundle branch block) [id number]   Chronic systolic dysfunction of left ventricle 02/02/2014   Chronic renal insufficiency 02/02/2014  Essential hypertension 02/02/2014   Morbid obesity ([place]) 02/02/2014   Coronary artery disease 08/15/2013   Shock ([place]) 08/15/2013   History of prolonged Q-T interval on ECG 10/31/2012   Hx of cardiomyopathy 10/31/2012   Automatic implantable cardioverter-defibrillator in situ 08/06/2008    REFERRING DIAG: G57.02 (ICD-10-CM) - Piriformis syndrome of RIGHT side   THERAPY DIAG:  Pain in right hip  Decreased functional mobility and endurance  Other low back pain  Rationale for Evaluation and Treatment Rehabilitation  ONSET DATE: about 1 month ago   SUBJECTIVE:    SUBJECTIVE STATEMENT: Pt reports her R buttock/hamstring areas is hurting her more today due the rain. She reports the rain always increases her pain.  PAIN:  Are you having pain? Yes: NPRS scale: 8/10 Pain location: Rt buttock Pain description: gnawing pain  Aggravating factors: pressure Relieving factors: heating pad , laying down    PRECAUTIONS: None   WEIGHT BEARING RESTRICTIONS: No   FALLS:  Has patient fallen in last 6 months? No   LIVING ENVIRONMENT: Lives with: lives with their family Lives in: House/apartment Stairs: Yes: Internal: 13 steps; on right going up often crawls up there vs walking  Has following equipment at home: Single point cane, Walker - 2 wheeled, and bed side commode   OCCUPATION: RN Retired   PLOF: Independent with basic ADLs and Independent with household mobility with device    PATIENT GOALS: Patient wants to relieve this pain .    NEXT MD VISIT: Next week for L knee.    OBJECTIVE: (objective measures completed at initial evaluation unless otherwise dated)    DIAGNOSTIC FINDINGS: none    PATIENT SURVEYS:  FOTO 31%. 08/23/22=47% 09/01/22=53%   COGNITION: Overall cognitive status: Within functional limits for tasks assessed                         SENSATION: WFL   EDEMA:  NT   MUSCLE LENGTH: Hamstrings: Right tight, painful  deg; Left tight deg   POSTURE: rounded shoulders and flexed trunk    PALPATION: Patient with exquisite pain in the lower L 5 to S1, SIJ border extending laterally to outer hip.   LOWER EXTREMITY ROM:   Passive ROM Right eval Left eval  Hip flexion      Hip extension      Hip abduction      Hip adduction      Hip internal rotation 10-15 deg 15-20 deg   Hip external rotation good good  Knee flexion      Knee extension      Ankle dorsiflexion      Ankle plantarflexion      Ankle inversion      Ankle eversion       (Blank rows = not tested)   LOWER EXTREMITY MMT:   MMT Right eval Left eval  Hip flexion 4+/5 4/5  Hip extension      Hip abduction 3-/5 3-/5  Hip adduction      Hip internal rotation      Hip external rotation      Knee flexion 5/5 5/5  Knee extension 5/5 5/5  Ankle dorsiflexion      Ankle plantarflexion      Ankle inversion      Ankle eversion       (Blank rows = not tested)   LOWER EXTREMITY SPECIAL TESTS:  Hip special tests: NT due to pain    FUNCTIONAL TESTS:  5 times  sit to stand: 24 sec 09/01/22=11.7" 2 minute walk test: NT on eval 08/17/22=340"   GAIT: Distance walked: 150 Assistive device utilized: Environmental consultant - 2 wheeled Level of assistance: SBA  Comments: walker pushed far out from  upper body     TODAY'S TREATMENT: [facility] Adult PT Treatment:                                                DATE: 09/20/22 Therapeutic Exercise: Prone leg curls 2x10 3# Prone leg lifts 2x10 3# Seated  hamstring stretch x3 30" STS x10 bari-mat Seated R hip clam GTB 3x10 Manual Therapy: STM/DTM to the R medial and lateral hamstring Skilled palpation to identify taut muscle bands and TrPs Trigger Point Dry Needling Treatment: Pre-treatment instruction: Patient instructed on dry needling rationale, procedures, and possible side effects including pain during treatment (achy,cramping feeling), bruising, drop of blood, lightheadedness, nausea, sweating. Patient Consent Given: Yes Education handout provided: Yes Muscles treated: R medial and lateral hamstrings  Needle size and number: .30x.75 mm x 1 Electrical stimulation performed: No Parameters: N/A Treatment response/outcome: Twitch response elicited Post-treatment instructions: Patient instructed to expect possible mild to moderate muscle soreness later today and/or tomorrow. Patient instructed in methods to reduce muscle soreness and to continue prescribed HEP. If patient was dry needled over the lung field, patient was instructed on signs and symptoms of pneumothorax and, however unlikely, to see immediate medical attention should they occur. Patient was also educated on signs and symptoms of infection and to seek medical attention should they occur. Patient verbalized understanding of these instructions and education.  [facility] Adult PT Treatment:                                                DATE: 09/14/22 Therapeutic Exercise: Nustep L5 x 5 minutes Seated hamstring and piriformis stretches Standing hip abduction 5# 10 x 2 each  Standing knee flexion 5# 10 x 2  Seated LAQ 5# x 20 each  Supine bridge x 10 Supine clam blue band bilateral and alternating Supine hamstring stretch right using strap  Therapeutic Activity: Step to pattern 8 stairs with 1UE -    [facility] Adult PT Treatment:                                                DATE: 09/01/22 Therapeutic Exercise: Nustep 5 min L6 UE/LE Supine Lower Trunk Rotation 5 reps - 10 hold Seated  Hamstring Stretch 2 reps - 30 hold each Seated Piriformis Stretch  2 reps - 30 hold each Standing Hip abd 5# 2 sets - 10 reps - 3 hold each Standing knee flex 5# 2 sets - 10 reps - 3 hold each Manual Therapy: STM/DTM to the R gluteal, piriformis muscles, proximal hamstring Skilled palpation to identify TrPs and taut muscle bands Therapeutic Activity: 5xSTS FOTO Trigger Point Dry Needling Treatment: Pre-treatment instruction: Patient instructed on dry needling rationale, procedures, and possible side effects including pain during treatment (achy,cramping feeling), bruising, drop of blood, lightheadedness, nausea, sweating. Patient Consent Given: Yes Education handout provided: Yes Muscles treated: R piriformis, Gluteal  min. Gluteal med  Needle size and number: .30x151m x 1 Electrical stimulation performed: No Parameters: N/A Treatment response/outcome: Twitch response elicited Post-treatment instructions: Patient instructed to expect possible mild to moderate muscle soreness later today and/or tomorrow. Patient instructed in methods to reduce muscle soreness and to continue prescribed HEP. If patient was dry needled over the lung field, patient was instructed on signs and symptoms of pneumothorax and, however unlikely, to see immediate medical attention should they occur. Patient was also educated on signs and symptoms of infection and to seek medical attention should they occur. Patient verbalized understanding of these instructions and education.                                                            PATIENT EDUCATION:  Education details: POC, HEP, differential diagnosis  Person educated: Patient Education method: ECustomer service managerEducation comprehension: verbalized understanding   HOME EXERCISE PROGRAM: [facility]: [email or web address] Date: 08/23/2022 Prepared by: AGar Ponto Exercises - Supine Lower Trunk Rotation  - 1 x daily - 7  x weekly - 2 sets - 10 reps - 10 hold - Seated Hamstring Stretch  - 1 x daily - 7 x weekly - 1 sets - 3 reps - 30 hold - Seated Piriformis Stretch  - 1 x daily - 7 x weekly - 1 sets - 3 reps - 20-30 hold - Standing Hip Flexor Stretch  - 1 x daily - 7 x weekly - 1 sets - 3 reps - 20-30 hold - Bridge with Hip Abduction and Resistance  - 1 x daily - 7 x weekly - 2 sets - 10 reps - 5 hold - Clamshell with Resistance  - 1 x daily - 7 x weekly - 2 sets - 10 reps - 5 hold - Sidelying Hip Abduction  - 1 x daily - 7 x weekly - 2 sets - 10 reps - 5 hold - Active Straight Leg Raise with Quad Set  - 1 x daily - 7 x weekly - 2 sets - 10 reps - 5 hold   ASSESSMENT:   CLINICAL IMPRESSION: PT was completed for manual therapy to the R hamstring muscles f/b TPDN to the TrPs and taut muscle bands. Twitch responses were elicited. Flexibility and strengthening therex were completed for muscle activiation. After treatment, pt reported a decease in her R gluteal/hamstring pain. Will assess pt's complete respose to the session when she returns for her next appt. Pt tolerated PT today without adverse effects. Pt overall is making appropriate progress with pain and function. Pt will continue to benefit from skilled PT to address impairments for improved function. Reassess the 2MWT.  OBJECTIVE IMPAIRMENTS: cardiopulmonary status limiting activity, decreased activity tolerance, decreased endurance, decreased mobility, difficulty walking, decreased ROM, decreased strength, increased fascial restrictions, postural dysfunction, obesity, and pain.    ACTIVITY LIMITATIONS: carrying, lifting, bending, sitting, standing, squatting, sleeping, stairs, transfers, bed mobility, and locomotion level   PARTICIPATION LIMITATIONS: shopping and community activity   PERSONAL FACTORS: Age, Fitness, and 3+ comorbidities: Polyarthralgia, obesity, defibrillator, diabetes  are also affecting patient's functional outcome.    REHAB POTENTIAL:  Good   CLINICAL DECISION MAKING: Stable/uncomplicated   EVALUATION COMPLEXITY: Low     GOALS: Goals reviewed with patient? Yes   SHORT TERM GOALS: Target  date: 09/06/2022   Patient will be independent in initial home exercise program Baseline: Goal status:  MET   2.  Patient will be able to sit more symmetrically on the right hip to develop normal movement patterns Baseline:  Goal status: MET   3.  Patient will complete functional testing goals set (2-minute walk, TUG) Baseline:  Goal status: MET   4.  Patient will begin to tolerate standing for up to 10 minutes to positively impact ADLs and quality of life Baseline:  Status: 08/23/22=pt estimates 10 mins Goal status: MET    LONG TERM GOALS: Target date: 10/04/2022     Patient will improve FOTO score to 50% or greater to demonstrate improved functional mobility Baseline: 31% Goal status: Met 53% 09/01/22   2.  Patient will perform bed mobility without increased back and hip pain >50% of the time  Baseline:  09/14/22: pain with bed mobility in clinic Goal status: ONGOING  3.  Patient will be able to walk up stairs with modified independence using 1 rail, preferred pattern/ Baseline: often uses hands on step above to crawl  09/14/22: can perform step to pattern, 1 rail in clinic, has not tried at home Goal status: PARTIALLY MET   4.  2 min walk test TBA /other goals  Baseline:  Goal status: INITIAL   5.  Patient will be independent with home exercise program upon discharge Baseline:  Goal status: Ongoing       PLAN:   PT FREQUENCY: 2x/week   PT DURATION: 8 weeks   PLANNED INTERVENTIONS: Therapeutic exercises, Therapeutic activity, Neuromuscular re-education, Balance training, Gait training, Patient/Family education, Self Care, Joint mobilization, Stair training, Aquatic Therapy, Cryotherapy, Moist heat, Ionotophoresis '4mg'$ /ml Dexamethasone, Manual therapy, and Re-evaluation   PLAN FOR NEXT SESSION: Progress note;  2-minute walk test.  NuStep.  Check home exercise program.  Consider manual therapy in side-lying to right hip/trunk   Gar Ponto MS, PT 09/20/22 11:34 AM

## 2022-09-21 NOTE — Therapy (Signed)
OUTPATIENT PHYSICAL THERAPY TREATMENT NOTE   Patient Name: Crystal [name] MRN: [id number] DOB:[birth year], 72 y.o., female Today's Date: 09/23/2022  PCP: Crist Infante MD  REFERRING PROVIDER: Alysia Penna MD    See note below for Objective Data and Assessment of Progress/Goals.    END OF SESSION:   PT End of Session - 09/22/22 1024     Visit Number 11    Number of Visits 16    Date for PT Re-Evaluation 10/04/22    Authorization Type Medicare    PT Start Time 1015    PT Stop Time 1100    PT Time Calculation (min) 45 min    Equipment Utilized During Treatment Other (comment)   RW   Activity Tolerance Patient tolerated treatment well    Behavior During Therapy WFL for tasks assessed/performed                  Past Medical History:  Diagnosis Date   AICD (automatic cardioverter/defibrillator) present    high RV threshold chronically, device was turned off in 2014; Device battery has been dead x 7 years; "turned it back on 06/22/2018"   Anemia, iron deficiency    Angioedema    felt to likely be due to ace inhibitors but says she has had this even off of medicines, appears to be tolerating ARBs chronically   Anxiety    Arthritis    "hands, legs, arms; bad in my back" (06/22/2018)   Asthmatic bronchitis with status asthmaticus    Bradycardia    CHF (congestive heart failure) (HCC)    Chronic lower back pain    Chronic pain    CKD (chronic kidney disease), stage III (HCC)    Coronary artery disease    Mild nonobstructive (30% LAD, 30% RCA) by 09/01/16 cath at [facility]   Degenerative joint disease    Depression    Diabetes mellitus, type 2 ([place])    Diabetic peripheral neuropathy ([place])    Dizziness    Dyspnea on exertion    Family history of adverse reaction to anesthesia    Mother has nausea   GERD (gastroesophageal reflux disease)    Gout    Heart valve disorder    History of blood transfusion 1981; ~ 2005; 12/2016   "childbirth; defibrillator OR;  colostomy OR"   History of mononucleosis 03/2014   Hyperlipidemia    Hypertension    Hypothyroid    Insomnia    Intervertebral disc degeneration    Ischemic cardiomyopathy    LBBB (left bundle branch block)    Myocardial infarction ([place]) dx'd ~ 2005   Nonischemic cardiomyopathy ([place])    s/p ICD in 2005. EF has since recovered   Obesity    On home oxygen therapy    "2L at night" (06/22/2018)   OSA on CPAP    Pneumonia    "couple times; last time was 12/2016" (06/22/2018)   PONV (postoperative nausea and vomiting)    Presence of permanent cardiac pacemaker    [employer]   Swelling    Syncope    Systemic hypertension    Vitamin D deficiency    Past Surgical History:  Procedure Laterality Date   APPENDECTOMY  07/13/2017   BLADDER SUSPENSION  1990   "w/hysterectomy"   BOWEL RESECTION N/A 07/09/2018   Procedure: SMALL BOWEL RESECTION;  Surgeon: Judeth Horn, MD;  Location: [place];  Service: General;  Laterality: N/A;   CARDIAC CATHETERIZATION  2005   no obstructive CAD per patient  CARDIAC CATHETERIZATION  2018   CARDIAC DEFIBRILLATOR PLACEMENT  2005   BiV ICD implanted,  LV lead is an epicardial lead   CARPAL TUNNEL RELEASE Left 07/25/2014   Dr.Williamson    CARPAL TUNNEL RELEASE Right 04/08/2019   Procedure: RIGHT CARPAL TUNNEL RELEASE ENDOSCOPIC;  Surgeon: Milly Jakob, MD;  Location: [place];  Service: Orthopedics;  Laterality: Right;   CATARACT EXTRACTION W/ INTRAOCULAR LENS  IMPLANT, BILATERAL Bilateral    COLONIC STENT PLACEMENT N/A 08/31/2017   Procedure: COLONIC STENT PLACEMENT;  Surgeon: Carol Ada, MD;  Location: [place];  Service: Endoscopy;  Laterality: N/A;   COLONOSCOPY     2010-2011 Dr.Kipreos    COLOSTOMY  12/2016   Archie Endo 01/26/2017   COLOSTOMY REVERSAL N/A 07/13/2017   Procedure: COLOSTOMY REVERSAL;  Surgeon: Judeth Horn, MD;  Location: [place];  Service: General;  Laterality: N/A;   CYST REMOVAL HAND Right 05/2013   thumb   DILATION AND  CURETTAGE OF UTERUS  X 5-6   ESOPHAGOGASTRODUODENOSCOPY (EGD) WITH PROPOFOL N/A 03/13/2020   Procedure: ESOPHAGOGASTRODUODENOSCOPY (EGD) WITH PROPOFOL;  Surgeon: Carol Ada, MD;  [facility];  Service: Endoscopy;  Laterality: N/A;   EXCISION MASS ABDOMINAL N/A 07/09/2018   Procedure: EXPLORATION OF  ABDOMINAL WOUND ERAS PATHWAY;  Surgeon: Judeth Horn, MD;  Location: [place];  Service: General;  Laterality: N/A;   FLEXIBLE SIGMOIDOSCOPY N/A 08/24/2017   Procedure: Beryle Quant;  Surgeon: Carol Ada, MD;  Location: [place];  Service: Endoscopy;  Laterality: N/A;   FLEXIBLE SIGMOIDOSCOPY N/A 08/31/2017   Procedure: FLEXIBLE SIGMOIDOSCOPY;  Surgeon: Carol Ada, MD;  Location: [place];  Service: Endoscopy;  Laterality: N/A;  stent placement   FLEXIBLE SIGMOIDOSCOPY N/A 03/13/2020   Procedure: FLEXIBLE SIGMOIDOSCOPY;  Surgeon: Carol Ada, MD;  [facility];  Service: Endoscopy;  Laterality: N/A;   FLEXIBLE SIGMOIDOSCOPY N/A 12/18/2020   Procedure: FLEXIBLE SIGMOIDOSCOPY;  Surgeon: Carol Ada, MD;  [facility];  Service: Endoscopy;  Laterality: N/A;   HEMOSTASIS CLIP PLACEMENT  03/13/2020   Procedure: HEMOSTASIS CLIP PLACEMENT;  Surgeon: Carol Ada, MD;  [facility];  Service: Endoscopy;;   HEMOSTASIS CLIP PLACEMENT  12/18/2020   Procedure: HEMOSTASIS CLIP PLACEMENT;  Surgeon: Carol Ada, MD;  [facility];  Service: Endoscopy;;   HOT HEMOSTASIS N/A 03/13/2020   Procedure: HOT HEMOSTASIS (ARGON PLASMA COAGULATION/BICAP);  Surgeon: Carol Ada, MD;  [facility];  Service: Endoscopy;  Laterality: N/A;   HOT HEMOSTASIS N/A 12/18/2020   Procedure: HOT HEMOSTASIS (ARGON PLASMA COAGULATION/BICAP);  Surgeon: Carol Ada, MD;  [facility];  Service: Endoscopy;  Laterality: N/A;   IMPLANTABLE CARDIOVERTER DEFIBRILLATOR GENERATOR CHANGE  2008   INSERTION OF MESH N/A 07/09/2018   Procedure: INSERTION OF  VICRYL MESH;  Surgeon: Judeth Horn, MD;  Location: [place];  Service: General;  Laterality: N/A;   LAPAROTOMY N/A 09/18/2017   Procedure: EXPLORATORY LAPAROTOMY;  Surgeon: Judeth Horn, MD;  Location: [place];  Service: General;  Laterality: N/A;   LEAD REVISION  06/22/2018   LEAD REVISION/REPAIR N/A 06/22/2018   Procedure: LEAD REVISION/REPAIR;  Surgeon: Deboraha Sprang, MD;  Location: [place] [facility];  Service: Cardiovascular;  Laterality: N/A;   LYSIS OF ADHESION N/A 09/18/2017   Procedure: LYSIS OF ADHESION;  Surgeon: Judeth Horn, MD;  Location: [place];  Service: General;  Laterality: N/A;   LYSIS OF ADHESION N/A 07/09/2018   Procedure: LYSIS OF ADHESION;  Surgeon: Judeth Horn, MD;  Location: [place];  Service: General;  Laterality: N/A;   PARTIAL COLECTOMY N/A 09/18/2017   Procedure: ILEOCOLECTOMY;  Surgeon: Judeth Horn, MD;  Location: [place];  Service: General;  Laterality: N/A;   PARTIAL COLECTOMY  09/25/2017   POLYPECTOMY  03/13/2020   Procedure: POLYPECTOMY;  Surgeon: Carol Ada, MD;  [facility];  Service: Endoscopy;;   RIGHT/LEFT HEART CATH AND CORONARY ANGIOGRAPHY N/A 06/01/2018   Procedure: RIGHT/LEFT HEART CATH AND CORONARY ANGIOGRAPHY;  Surgeon: Jettie Booze, MD;  Location: [place] [facility];  Service: Cardiovascular;  Laterality: N/A;   SIGMOIDOSCOPY N/A 09/18/2017   Procedure: SIGMOIDOSCOPY;  Surgeon: Judeth Horn, MD;  Location: [place];  Service: General;  Laterality: N/A;   SMALL INTESTINE SURGERY  07/09/2018   EXPLORATION OF  ABDOMINAL WOUND ERAS PATHWAYN/; MESH; LYSIS OF ADHESIONS   TOTAL ABDOMINAL HYSTERECTOMY  1990   TRANSFORAMINAL LUMBAR INTERBODY FUSION (TLIF) WITH PEDICLE SCREW FIXATION 2 LEVEL N/A 01/07/2020   Procedure: Lumbar Four-Five and Lumbar Five-Sacral One open lumbar decompression and transforaminal lumbar interbody fusion;  Surgeon: Judith Part, MD;  Location: [place];  Service: Neurosurgery;  Laterality: N/A;  Lumbar Four-Five and  Lumbar Five-Sacral One open lumbar decompression and transforaminal lumbar interbody fusion   TRIGGER FINGER RELEASE Right 05/213   TRIGGER FINGER RELEASE Right 04/08/2019   Procedure: RIGHT INDEX RELEASE TRIGGER FINGER/A-1 PULLEY;  Surgeon: Milly Jakob, MD;  Location: [place];  Service: Orthopedics;  Laterality: Right;   TUBAL LIGATION  1980s   VESICOVAGINAL FISTULA CLOSURE W/ TAH     Patient Active Problem List   Diagnosis Date Noted   Hypermagnesemia 10/27/2021   Diabetes mellitus without complication ([place]) [id number]   Hav (hallux abducto valgus), unspecified laterality 06/26/2020   Lumbar stenosis with neurogenic claudication 01/07/2020   Generalized abdominal pain    CKD (chronic kidney disease) stage 4, GFR 15-29 ml/min (HCC)    SBO (small bowel obstruction) ([place]) 08/17/2019   Hyponatremia 08/17/2019   COVID-19 virus infection 08/17/2019   Acute lower UTI 08/17/2019   Prolonged QT interval 08/17/2019   Acute hypoxemic respiratory failure due to COVID-19 ([place]) 08/07/2019   Normocytic anemia 08/07/2019   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   Congestive heart failure (CHF) ([place]) [id number]   Acute systolic heart failure ([place])    Type 2 diabetes mellitus with stage 3 chronic kidney disease, with long-term current use of insulin ([place]) 11/22/2017   Acute respiratory failure with hypoxia ([place]) 11/22/2017   Bowel obstruction ([place]) 08/23/2017   Colonic obstruction ([place]) 08/22/2017   Dehydration    Intractable vomiting with nausea    Hyperkalemia    Gastroenteritis 08/11/2017   Hypotension 08/11/2017   Acute renal failure superimposed on stage 3 chronic kidney disease ([place]) 08/11/2017   Hypovolemia 08/10/2017   Colostomy in place ([place]) 07/13/2017   Adjustment disorder with depressed mood    Debilitated 02/06/2017   S/P colostomy ([place]) 01/31/2017   Pressure injury of skin 01/28/2017   Colitis 01/26/2017   Acute MI ([place]) 05/05/2014   Anemia, iron deficiency 05/05/2014   Airway  hyperreactivity 05/05/2014   Diabetes mellitus, type 2 ([place]) [id number]   Chronic systolic heart failure ([place]) 05/05/2014   HLD (hyperlipidemia) 05/05/2014   Hypothyroidism 05/05/2014   Nonischemic cardiomyopathy ([place]) 02/02/2014   LBBB (left bundle branch block) [id number]   Chronic systolic dysfunction of left ventricle 02/02/2014   Chronic renal insufficiency 02/02/2014   Essential hypertension 02/02/2014   Morbid obesity ([place]) 02/02/2014   Coronary artery disease 08/15/2013   Shock ([place]) 08/15/2013   History  of prolonged Q-T interval on ECG 10/31/2012   Hx of cardiomyopathy 10/31/2012   Automatic implantable cardioverter-defibrillator in situ 08/06/2008    REFERRING DIAG: G57.02 (ICD-10-CM) - Piriformis syndrome of RIGHT side   THERAPY DIAG:  Pain in right hip  Decreased functional mobility and endurance  Other low back pain  Rationale for Evaluation and Treatment Rehabilitation  ONSET DATE: about 1 month ago   SUBJECTIVE:    SUBJECTIVE STATEMENT: Pt reports her R leg/hamstring pain fom earlier this week is much better. Only 3/10 buttock pain with sitting. No pain with standing or walking.  PAIN:  Are you having pain? Yes: NPRS scale: 3/10 Pain location: Rt buttock Pain description: gnawing pain  Aggravating factors: pressure Relieving factors: heating pad , laying down    PRECAUTIONS: None   WEIGHT BEARING RESTRICTIONS: No   FALLS:  Has patient fallen in last 6 months? No   LIVING ENVIRONMENT: Lives with: lives with their family Lives in: House/apartment Stairs: Yes: Internal: 13 steps; on right going up often crawls up there vs walking  Has following equipment at home: Single point cane, Walker - 2 wheeled, and bed side commode   OCCUPATION: RN Retired   PLOF: Independent with basic ADLs and Independent with household mobility with device   PATIENT GOALS: Patient wants to relieve this pain .    NEXT MD VISIT: Next week for L knee.    OBJECTIVE:  (objective measures completed at initial evaluation unless otherwise dated)    DIAGNOSTIC FINDINGS: none    PATIENT SURVEYS:  FOTO 31%. 08/23/22=47% 09/01/22=53%   COGNITION: Overall cognitive status: Within functional limits for tasks assessed                         SENSATION: WFL   EDEMA:  NT   MUSCLE LENGTH: Hamstrings: Right tight, painful  deg; Left tight deg   POSTURE: rounded shoulders and flexed trunk    PALPATION: Patient with exquisite pain in the lower L 5 to S1, SIJ border extending laterally to outer hip.   LOWER EXTREMITY ROM:   Passive ROM Right eval Left eval  Hip flexion      Hip extension      Hip abduction      Hip adduction      Hip internal rotation 10-15 deg 15-20 deg   Hip external rotation good good  Knee flexion      Knee extension      Ankle dorsiflexion      Ankle plantarflexion      Ankle inversion      Ankle eversion       (Blank rows = not tested)   LOWER EXTREMITY MMT:   MMT Right eval Left eval  Hip flexion 4+/5 4/5  Hip extension      Hip abduction 3-/5 3-/5  Hip adduction      Hip internal rotation      Hip external rotation      Knee flexion 5/5 5/5  Knee extension 5/5 5/5  Ankle dorsiflexion      Ankle plantarflexion      Ankle inversion      Ankle eversion       (Blank rows = not tested)   LOWER EXTREMITY SPECIAL TESTS:  Hip special tests: NT due to pain    FUNCTIONAL TESTS:  5 times sit to stand: 24 sec 09/01/22=11.7" 2 minute walk test: NT on eval 08/17/22=340"   GAIT: Distance walked: 150  Assistive device utilized: Environmental consultant - 2 wheeled Level of assistance: SBA  Comments: walker pushed far out from  upper body    TODAY'S TREATMENT: [facility] Adult PT Treatment:                                                DATE: 09/22/22 Therapeutic Exercise: Banded Bridging BluTB 2x10 Supine clams BluTB 2x10 SLS standing x5 30" Hamstring curl 2x10 5# Manual Therapy: STM/DTM to the R piriformis and proximal muscles inserting  on the posterior ischial tuberosity Contract relax stretching for hamstrings and hip ERs  [facility] Adult PT Treatment:                                                DATE: 09/20/22 Therapeutic Exercise: Prone leg curls 2x10 3# Prone leg lifts 2x10 3# Seated hamstring stretch x3 30" STS x10 bari-mat Seated R hip clam GTB 3x10 Manual Therapy: STM/DTM to the R medial and lateral hamstring Skilled palpation to identify taut muscle bands and TrPs Trigger Point Dry Needling Treatment: Pre-treatment instruction: Patient instructed on dry needling rationale, procedures, and possible side effects including pain during treatment (achy,cramping feeling), bruising, drop of blood, lightheadedness, nausea, sweating. Patient Consent Given: Yes Education handout provided: Yes Muscles treated: R medial and lateral hamstrings  Needle size and number: .30x.75 mm x 1 Electrical stimulation performed: No Parameters: N/A Treatment response/outcome: Twitch response elicited Post-treatment instructions: Patient instructed to expect possible mild to moderate muscle soreness later today and/or tomorrow. Patient instructed in methods to reduce muscle soreness and to continue prescribed HEP. If patient was dry needled over the lung field, patient was instructed on signs and symptoms of pneumothorax and, however unlikely, to see immediate medical attention should they occur. Patient was also educated on signs and symptoms of infection and to seek medical attention should they occur. Patient verbalized understanding of these instructions and education.  [facility] Adult PT Treatment:                                                DATE: 09/14/22 Therapeutic Exercise: Nustep L5 x 5 minutes Seated hamstring and piriformis stretches Standing hip abduction 5# 10 x 2 each  Standing knee flexion 5# 10 x 2  Seated LAQ 5# x 20 each  Supine bridge x 10 Supine clam blue band bilateral and alternating Supine hamstring stretch right  using strap  Therapeutic Activity: Step to pattern 8 stairs with 1UE -    [facility] Adult PT Treatment:                                                DATE: 09/01/22 Therapeutic Exercise: Nustep 5 min L6 UE/LE Supine Lower Trunk Rotation 5 reps - 10 hold Seated Hamstring Stretch 2 reps - 30 hold each Seated Piriformis Stretch  2 reps - 30 hold each Standing Hip abd 5# 2 sets - 10 reps - 3 hold each Standing knee flex 5# 2 sets -  10 reps - 3 hold each Manual Therapy: STM/DTM to the R gluteal, piriformis muscles, proximal hamstring Skilled palpation to identify TrPs and taut muscle bands Therapeutic Activity: 5xSTS FOTO Trigger Point Dry Needling Treatment: Pre-treatment instruction: Patient instructed on dry needling rationale, procedures, and possible side effects including pain during treatment (achy,cramping feeling), bruising, drop of blood, lightheadedness, nausea, sweating. Patient Consent Given: Yes Education handout provided: Yes Muscles treated: R piriformis, Gluteal min. Gluteal med  Needle size and number: .30x125m x 1 Electrical stimulation performed: No Parameters: N/A Treatment response/outcome: Twitch response elicited Post-treatment instructions: Patient instructed to expect possible mild to moderate muscle soreness later today and/or tomorrow. Patient instructed in methods to reduce muscle soreness and to continue prescribed HEP. If patient was dry needled over the lung field, patient was instructed on signs and symptoms of pneumothorax and, however unlikely, to see immediate medical attention should they occur. Patient was also educated on signs and symptoms of infection and to seek medical attention should they occur. Patient verbalized understanding of these instructions and education.                                                            PATIENT EDUCATION:  Education details: POC, HEP, differential diagnosis  Person educated: Patient Education method: EData processing managerEducation comprehension: verbalized understanding   HOME EXERCISE PROGRAM: Access [facility]: [email or web address]URL: [email or web address] Date: 08/23/2022 Prepared by: AGar Ponto Exercises - Supine Lower Trunk Rotation  - 1 x daily - 7 x weekly - 2 sets - 10 reps - 10 hold - Seated Hamstring Stretch  - 1 x daily - 7 x weekly - 1 sets - 3 reps - 30 hold - Seated Piriformis Stretch  - 1 x daily - 7 x weekly - 1 sets - 3 reps - 20-30 hold - Standing Hip Flexor Stretch  - 1 x daily - 7 x weekly - 1 sets - 3 reps - 20-30 hold - Bridge with Hip Abduction and Resistance  - 1 x daily - 7 x weekly - 2 sets - 10 reps - 5 hold - Clamshell with Resistance  - 1 x daily - 7 x weekly - 2 sets - 10 reps - 5 hold - Sidelying Hip Abduction  - 1 x daily - 7 x weekly - 2 sets - 10 reps - 5 hold - Active Straight Leg Raise with Quad Set  - 1 x daily - 7 x weekly - 2 sets - 10 reps - 5 hold   ASSESSMENT:   CLINICAL IMPRESSION: Manual therapy as noted above to address tight and tender muscles of the lower gluteal and posterior/proximal hip. Strengthening therex was then completed for activation. Pt's function and pain are improving. Pt is primarily experiencing R gluteal pain with with sitting. Will recommend a sitting doughnut to see if offloading helps to reduce pressure and pain. Pt tolerated PT today without adverse effects.Pt will continue to benefit from skilled PT to address impairments for improved function of the R hip with less pain. Reassess the 2MWT.  OBJECTIVE IMPAIRMENTS: cardiopulmonary status limiting activity, decreased activity tolerance, decreased endurance, decreased mobility, difficulty walking, decreased ROM, decreased strength, increased fascial restrictions, postural dysfunction, obesity, and pain.    ACTIVITY LIMITATIONS: carrying, lifting, bending, sitting, standing, squatting, sleeping, stairs, transfers,  bed mobility, and locomotion level   PARTICIPATION  LIMITATIONS: shopping and community activity   PERSONAL FACTORS: Age, Fitness, and 3+ comorbidities: Polyarthralgia, obesity, defibrillator, diabetes  are also affecting patient's functional outcome.    REHAB POTENTIAL: Good   CLINICAL DECISION MAKING: Stable/uncomplicated   EVALUATION COMPLEXITY: Low     GOALS: Goals reviewed with patient? Yes   SHORT TERM GOALS: Target date: 09/06/2022   Patient will be independent in initial home exercise program Baseline: Goal status:  MET   2.  Patient will be able to sit more symmetrically on the right hip to develop normal movement patterns Baseline:  Goal status: MET   3.  Patient will complete functional testing goals set (2-minute walk, TUG) Baseline:  Goal status: MET   4.  Patient will begin to tolerate standing for up to 10 minutes to positively impact ADLs and quality of life Baseline:  Status: 08/23/22=pt estimates 10 mins Goal status: MET    LONG TERM GOALS: Target date: 10/04/2022     Patient will improve FOTO score to 50% or greater to demonstrate improved functional mobility Baseline: 31% Goal status: Met 53% 09/01/22   2.  Patient will perform bed mobility without increased back and hip pain >50% of the time  Baseline:  09/14/22: pain with bed mobility in clinic Goal status: ONGOING  3.  Patient will be able to walk up stairs with modified independence using 1 rail, preferred pattern/ Baseline: often uses hands on step above to crawl  09/14/22: can perform step to pattern, 1 rail in clinic, has not tried at home Goal status: PARTIALLY MET   4.  2 min walk test TBA /other goals  Baseline:  Goal status: INITIAL   5.  Patient will be independent with home exercise program upon discharge Baseline:  Goal status: Ongoing       PLAN:   PT FREQUENCY: 2x/week   PT DURATION: 8 weeks   PLANNED INTERVENTIONS: Therapeutic exercises, Therapeutic activity, Neuromuscular re-education, Balance training, Gait training,  Patient/Family education, Self Care, Joint mobilization, Stair training, Aquatic Therapy, Cryotherapy, Moist heat, Ionotophoresis '4mg'$ /ml Dexamethasone, Manual therapy, and Re-evaluation   PLAN FOR NEXT SESSION: Progress note; 2-minute walk test.  NuStep.  Check home exercise program.  Consider manual therapy in side-lying to right hip/trunk   Gar Ponto MS, PT 09/23/22 5:58 AM

## 2022-09-22 ENCOUNTER — Ambulatory Visit: Payer: Medicare Other

## 2022-09-22 DIAGNOSIS — M5459 Other low back pain: Secondary | ICD-10-CM | POA: Diagnosis not present

## 2022-09-22 DIAGNOSIS — M25551 Pain in right hip: Secondary | ICD-10-CM | POA: Diagnosis not present

## 2022-09-22 DIAGNOSIS — Z7409 Other reduced mobility: Secondary | ICD-10-CM | POA: Diagnosis not present

## 2022-09-23 ENCOUNTER — Ambulatory Visit: Payer: Medicare Other

## 2022-09-23 DIAGNOSIS — I428 Other cardiomyopathies: Secondary | ICD-10-CM | POA: Diagnosis not present

## 2022-09-23 LAB — CUP PACEART REMOTE DEVICE CHECK
Battery Remaining Longevity: 60 mo
Battery Remaining Percentage: 68 %
Brady Statistic RA Percent Paced: 0 %
Brady Statistic RV Percent Paced: 99 %
Date Time Interrogation Session: 20240301020100
HighPow Impedance: 74 Ohm
Implantable Lead Connection Status: 753985
Implantable Lead Connection Status: 753985
Implantable Lead Connection Status: 753985
Implantable Lead Implant Date: 20191129
Implantable Lead Implant Date: 20191129
Implantable Lead Implant Date: 20191129
Implantable Lead Location: 753858
Implantable Lead Location: 753859
Implantable Lead Location: 753860
Implantable Lead Model: 293
Implantable Lead Model: 4086
Implantable Lead Serial Number: 226691
Implantable Lead Serial Number: 440868
Implantable Pulse Generator Implant Date: 20191129
Lead Channel Impedance Value: 487 Ohm
Lead Channel Impedance Value: 599 Ohm
Lead Channel Impedance Value: 887 Ohm
Lead Channel Setting Pacing Amplitude: 2 V
Lead Channel Setting Pacing Amplitude: 2.5 V
Lead Channel Setting Pacing Amplitude: 3 V
Lead Channel Setting Pacing Pulse Width: 0.4 ms
Lead Channel Setting Pacing Pulse Width: 1 ms
Lead Channel Setting Sensing Sensitivity: 0.5 mV
Lead Channel Setting Sensing Sensitivity: 1 mV
Pulse Gen Serial Number: 227627

## 2022-09-27 ENCOUNTER — Encounter: Payer: Self-pay | Admitting: Registered Nurse

## 2022-09-27 ENCOUNTER — Encounter: Payer: Medicare Other | Attending: Registered Nurse | Admitting: Registered Nurse

## 2022-09-27 VITALS — Ht 64.0 in | Wt 250.0 lb

## 2022-09-27 DIAGNOSIS — M545 Low back pain, unspecified: Secondary | ICD-10-CM | POA: Diagnosis not present

## 2022-09-27 DIAGNOSIS — M2550 Pain in unspecified joint: Secondary | ICD-10-CM | POA: Diagnosis not present

## 2022-09-27 DIAGNOSIS — Z5181 Encounter for therapeutic drug level monitoring: Secondary | ICD-10-CM | POA: Insufficient documentation

## 2022-09-27 DIAGNOSIS — M7918 Myalgia, other site: Secondary | ICD-10-CM | POA: Diagnosis not present

## 2022-09-27 DIAGNOSIS — M170 Bilateral primary osteoarthritis of knee: Secondary | ICD-10-CM | POA: Insufficient documentation

## 2022-09-27 DIAGNOSIS — G894 Chronic pain syndrome: Secondary | ICD-10-CM | POA: Insufficient documentation

## 2022-09-27 DIAGNOSIS — G8929 Other chronic pain: Secondary | ICD-10-CM | POA: Insufficient documentation

## 2022-09-27 DIAGNOSIS — Z79891 Long term (current) use of opiate analgesic: Secondary | ICD-10-CM | POA: Insufficient documentation

## 2022-09-27 MED ORDER — OXYCODONE-ACETAMINOPHEN 7.5-325 MG PO TABS: 1.0000 | ORAL_TABLET | Freq: Three times a day (TID) | ORAL | 0 refills | Status: DC | PRN

## 2022-09-27 NOTE — Progress Notes (Signed)
Subjective:    Patient ID: Crystal [name], female    DOB: 1950/12/30, 72 y.o.   MRN: [id number]  HPI: Crystal [name] is a 72 y.o. female who returns for follow up appointment for chronic pain and medication refill. She states her pain is located in her lower back and bilateral knee pain. Crystal [name] reports increase intensity of lower back pain and only receiving 4- 6 hours of pain relief. She was instructed to keep a pain journal for two weeks and send a My- Chart message in two weeks, she verbalizes understanding. She rates her pain 6. Her current exercise regime is attending physical therapy two days a week, walking and performing stretching exercises.  Crystal [name] Morphine equivalent is 18.75  MME.   Oral Swab was Performed today.   Vitals: 113/62 P 78 O2 Sat 91%   Pain Inventory Average Pain 9 Pain Right Now 6 My pain is constant, sharp, burning, stabbing, and aching  In the last 24 hours, has pain interfered with the following? General activity 8 Relation with others 6 Enjoyment of life 8 What TIME of day is your pain at its worst? morning , evening, and night Sleep (in general) Fair  Pain is worse with: walking, bending, standing, and some activites Pain improves with: medication and injections Relief from Meds: 8  Family History  Problem Relation Age of Onset   Hypertension Father    Heart disease Father    Cancer Father        prostate   Heart disease Other        (Maternal side) Ischemic heart disease   Diabetes Mellitus II Other    Arrhythmia Mother    Diabetes Mellitus II Mother        Borderline DM   Hypertension Mother    Asthma Mother    Cancer Brother    Dementia Brother    Hypertension Brother    Hypertension Daughter    Hypertension Daughter    Diabetes Daughter    Hypertension Daughter    Atrial fibrillation Daughter    GER disease Daughter    Hypertension Son    Anxiety disorder Son    Hypothyroidism Son    Breast cancer Maternal Grandmother         in her 74's   Social History   Socioeconomic History   Marital status: Widowed    Spouse name: Not on file   Number of children: Not on file   Years of education: Not on file   Highest education level: Not on file  Occupational History    Comment: retired LPN  Tobacco Use   Smoking status: Never   Smokeless tobacco: Never  Vaping Use   Vaping Use: Never used  Substance and Sexual Activity   Alcohol use: Not Currently   Drug use: Never   Sexual activity: Not Currently  Other Topics Concern   Not on file  Social History Narrative   Pt lives [place].  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      As of 03/15/17:   Diet: 1800 Calorie      Caffeine: Yes      Married, if yes what year: Widowed, married 1973      Do you live in a house, apartment, assisted living, [place], trailer, ect: House, 1 stories, and 1 person      Pets: No      Current/Past profession:  LPN, retired       Exercise: Yes, walking          Living Will: Yes   DNR: No   POA/HPOA: Yes      Functional Status:   Do you have difficulty bathing or dressing yourself? No   Do you have difficulty preparing food or eating? No   Do you have difficulty managing your medications? No   Do you have difficulty managing your finances? No   Do you have difficulty affording your medications? Yes   Social Determinants of Health   Financial Resource Strain: Low Risk  (10/11/2017)   Overall Financial Resource Strain (CARDIA)    Difficulty of Paying Living Expenses: Not hard at all  Food Insecurity: No Food Insecurity (01/03/2022)   Hunger Vital Sign    Worried About Running Out of Food in the Last Year: Never true    Ran Out of Food in the Last Year: Never true  Transportation Needs: No Transportation Needs (01/03/2022)   PRAPARE - Hydrologist (Medical): No    Lack of Transportation (Non-Medical): No  Physical Activity:  Inactive (10/11/2017)   Exercise Vital Sign    Days of Exercise per Week: 0 days    Minutes of Exercise per Session: 0 min  Stress: Stress Concern Present (10/11/2017)   [place]    Feeling of Stress : To some extent  Social Connections: Somewhat Isolated (10/11/2017)   Social Connection and Isolation Panel [NHANES]    Frequency of Communication with Friends and Family: More than three times a week    Frequency of Social Gatherings with Friends and Family: More than three times a week    Attends Religious Services: More than 4 times per year    Active Member of [employer] or Organizations: No    Attends Archivist Meetings: Never    Marital Status: Widowed   Past Surgical History:  Procedure Laterality Date   APPENDECTOMY  07/13/2017   BLADDER SUSPENSION  1990   "w/hysterectomy"   BOWEL RESECTION N/A 07/09/2018   Procedure: SMALL BOWEL RESECTION;  Surgeon: Judeth Horn, MD;  Location: [facility];  Service: General;  Laterality: N/A;   CARDIAC CATHETERIZATION  2005   no obstructive CAD per patient   CARDIAC CATHETERIZATION  2018   CARDIAC DEFIBRILLATOR PLACEMENT  2005   BiV ICD implanted,  LV lead is an epicardial lead   CARPAL TUNNEL RELEASE Left 07/25/2014   Dr.Williamson    CARPAL TUNNEL RELEASE Right 04/08/2019   Procedure: RIGHT CARPAL TUNNEL RELEASE ENDOSCOPIC;  Surgeon: Milly Jakob, MD;  Location: [facility];  Service: Orthopedics;  Laterality: Right;   CATARACT EXTRACTION W/ INTRAOCULAR LENS  IMPLANT, BILATERAL Bilateral    COLONIC STENT PLACEMENT N/A 08/31/2017   Procedure: COLONIC STENT PLACEMENT;  Surgeon: Carol Ada, MD;  Location: [facility];  Service: Endoscopy;  Laterality: N/A;   COLONOSCOPY     2010-2011 Dr.Kipreos    COLOSTOMY  12/2016   Archie Endo 01/26/2017   COLOSTOMY REVERSAL N/A 07/13/2017   Procedure: COLOSTOMY REVERSAL;  Surgeon: Judeth Horn, MD;  Location: [facility];  Service: General;   Laterality: N/A;   CYST REMOVAL HAND Right 05/2013   thumb   DILATION AND CURETTAGE OF UTERUS  X 5-6   ESOPHAGOGASTRODUODENOSCOPY (EGD) WITH PROPOFOL N/A 03/13/2020   Procedure: ESOPHAGOGASTRODUODENOSCOPY (EGD) WITH PROPOFOL;  Surgeon: Carol Ada, MD;  [facility];  Service: Endoscopy;  Laterality: N/A;  EXCISION MASS ABDOMINAL N/A 07/09/2018   Procedure: EXPLORATION OF  ABDOMINAL WOUND ERAS PATHWAY;  Surgeon: Judeth Horn, MD;  Location: [facility];  Service: General;  Laterality: N/A;   FLEXIBLE SIGMOIDOSCOPY N/A 08/24/2017   Procedure: Beryle Quant;  Surgeon: Carol Ada, MD;  Location: [facility];  Service: Endoscopy;  Laterality: N/A;   FLEXIBLE SIGMOIDOSCOPY N/A 08/31/2017   Procedure: FLEXIBLE SIGMOIDOSCOPY;  Surgeon: Carol Ada, MD;  Location: [facility];  Service: Endoscopy;  Laterality: N/A;  stent placement   FLEXIBLE SIGMOIDOSCOPY N/A 03/13/2020   Procedure: FLEXIBLE SIGMOIDOSCOPY;  Surgeon: Carol Ada, MD;  [facility];  Service: Endoscopy;  Laterality: N/A;   FLEXIBLE SIGMOIDOSCOPY N/A 12/18/2020   Procedure: FLEXIBLE SIGMOIDOSCOPY;  Surgeon: Carol Ada, MD;  [facility];  Service: Endoscopy;  Laterality: N/A;   HEMOSTASIS CLIP PLACEMENT  03/13/2020   Procedure: HEMOSTASIS CLIP PLACEMENT;  Surgeon: Carol Ada, MD;  [facility];  Service: Endoscopy;;   HEMOSTASIS CLIP PLACEMENT  12/18/2020   Procedure: HEMOSTASIS CLIP PLACEMENT;  Surgeon: Carol Ada, MD;  [facility];  Service: Endoscopy;;   HOT HEMOSTASIS N/A 03/13/2020   Procedure: HOT HEMOSTASIS (ARGON PLASMA COAGULATION/BICAP);  Surgeon: Carol Ada, MD;  [facility];  Service: Endoscopy;  Laterality: N/A;   HOT HEMOSTASIS N/A 12/18/2020   Procedure: HOT HEMOSTASIS (ARGON PLASMA COAGULATION/BICAP);  Surgeon: Carol Ada, MD;  Location[facility];  Service: Endoscopy;  Laterality: N/A;   IMPLANTABLE CARDIOVERTER DEFIBRILLATOR GENERATOR  CHANGE  2008   INSERTION OF MESH N/A 07/09/2018   Procedure: INSERTION OF VICRYL MESH;  Surgeon: Judeth Horn, MD;  Location: [facility];  Service: General;  Laterality: N/A;   LAPAROTOMY N/A 09/18/2017   Procedure: EXPLORATORY LAPAROTOMY;  Surgeon: Judeth Horn, MD;  Location: [facility];  Service: General;  Laterality: N/A;   LEAD REVISION  06/22/2018   LEAD REVISION/REPAIR N/A 06/22/2018   Procedure: LEAD REVISION/REPAIR;  Surgeon: Deboraha Sprang, MD;  Location: [facility];  Service: Cardiovascular;  Laterality: N/A;   LYSIS OF ADHESION N/A 09/18/2017   Procedure: LYSIS OF ADHESION;  Surgeon: Judeth Horn, MD;  Location: [facility];  Service: General;  Laterality: N/A;   LYSIS OF ADHESION N/A 07/09/2018   Procedure: LYSIS OF ADHESION;  Surgeon: Judeth Horn, MD;  Location: [facility];  Service: General;  Laterality: N/A;   PARTIAL COLECTOMY N/A 09/18/2017   Procedure: ILEOCOLECTOMY;  Surgeon: Judeth Horn, MD;  Location: [facility];  Service: General;  Laterality: N/A;   PARTIAL COLECTOMY  09/25/2017   POLYPECTOMY  03/13/2020   Procedure: POLYPECTOMY;  Surgeon: Carol Ada, MD;  [facility];  Service: Endoscopy;;   RIGHT/LEFT HEART CATH AND CORONARY ANGIOGRAPHY N/A 06/01/2018   Procedure: RIGHT/LEFT HEART CATH AND CORONARY ANGIOGRAPHY;  Surgeon: Jettie Booze, MD;  Location: [facility];  Service: Cardiovascular;  Laterality: N/A;   SIGMOIDOSCOPY N/A 09/18/2017   Procedure: SIGMOIDOSCOPY;  Surgeon: Judeth Horn, MD;  Location: [facility];  Service: General;  Laterality: N/A;   SMALL INTESTINE SURGERY  07/09/2018   EXPLORATION OF  ABDOMINAL WOUND ERAS PATHWAYN/; MESH; LYSIS OF ADHESIONS   TOTAL ABDOMINAL HYSTERECTOMY  1990   TRANSFORAMINAL LUMBAR INTERBODY FUSION (TLIF) WITH PEDICLE SCREW FIXATION 2 LEVEL N/A 01/07/2020   Procedure: Lumbar Four-Five and Lumbar Five-Sacral One open lumbar decompression and transforaminal lumbar interbody fusion;  Surgeon: Judith Part, MD;  Location:  [facility];  Service: Neurosurgery;  Laterality: N/A;  Lumbar Four-Five and Lumbar Five-Sacral One open lumbar decompression and  transforaminal lumbar interbody fusion   TRIGGER FINGER RELEASE Right 05/213   TRIGGER FINGER RELEASE Right 04/08/2019   Procedure: RIGHT INDEX RELEASE TRIGGER FINGER/A-1 PULLEY;  Surgeon: Milly Jakob, MD;  Location: [facility];  Service: Orthopedics;  Laterality: Right;   TUBAL LIGATION  1980s   VESICOVAGINAL FISTULA CLOSURE W/ TAH     Past Surgical History:  Procedure Laterality Date   APPENDECTOMY  07/13/2017   BLADDER SUSPENSION  1990   "w/hysterectomy"   BOWEL RESECTION N/A 07/09/2018   Procedure: SMALL BOWEL RESECTION;  Surgeon: Judeth Horn, MD;  Location: [facility];  Service: General;  Laterality: N/A;   CARDIAC CATHETERIZATION  2005   no obstructive CAD per patient   CARDIAC CATHETERIZATION  2018   CARDIAC DEFIBRILLATOR PLACEMENT  2005   BiV ICD implanted,  LV lead is an epicardial lead   CARPAL TUNNEL RELEASE Left 07/25/2014   Dr.Williamson    CARPAL TUNNEL RELEASE Right 04/08/2019   Procedure: RIGHT CARPAL TUNNEL RELEASE ENDOSCOPIC;  Surgeon: Milly Jakob, MD;  Location: [facility];  Service: Orthopedics;  Laterality: Right;   CATARACT EXTRACTION W/ INTRAOCULAR LENS  IMPLANT, BILATERAL Bilateral    COLONIC STENT PLACEMENT N/A 08/31/2017   Procedure: COLONIC STENT PLACEMENT;  Surgeon: Carol Ada, MD;  Location: [facility];  Service: Endoscopy;  Laterality: N/A;   COLONOSCOPY     2010-2011 Dr.Kipreos    COLOSTOMY  12/2016   Archie Endo 01/26/2017   COLOSTOMY REVERSAL N/A 07/13/2017   Procedure: COLOSTOMY REVERSAL;  Surgeon: Judeth Horn, MD;  Location: [facility];  Service: General;  Laterality: N/A;   CYST REMOVAL HAND Right 05/2013   thumb   DILATION AND CURETTAGE OF UTERUS  X 5-6   ESOPHAGOGASTRODUODENOSCOPY (EGD) WITH PROPOFOL N/A 03/13/2020   Procedure: ESOPHAGOGASTRODUODENOSCOPY (EGD) WITH PROPOFOL;  Surgeon: Carol Ada, MD;  [facility];  Service:  Endoscopy;  Laterality: N/A;   EXCISION MASS ABDOMINAL N/A 07/09/2018   Procedure: EXPLORATION OF  ABDOMINAL WOUND ERAS PATHWAY;  Surgeon: Judeth Horn, MD;  Location: [facility];  Service: General;  Laterality: N/A;   FLEXIBLE SIGMOIDOSCOPY N/A 08/24/2017   Procedure: Beryle Quant;  Surgeon: Carol Ada, MD;  Location: [facility];  Service: Endoscopy;  Laterality: N/A;   FLEXIBLE SIGMOIDOSCOPY N/A 08/31/2017   Procedure: FLEXIBLE SIGMOIDOSCOPY;  Surgeon: Carol Ada, MD;  Location: [facility];  Service: Endoscopy;  Laterality: N/A;  stent placement   FLEXIBLE SIGMOIDOSCOPY N/A 03/13/2020   Procedure: FLEXIBLE SIGMOIDOSCOPY;  Surgeon: Carol Ada, MD;  [facility];  Service: Endoscopy;  Laterality: N/A;   FLEXIBLE SIGMOIDOSCOPY N/A 12/18/2020   Procedure: FLEXIBLE SIGMOIDOSCOPY;  Surgeon: Carol Ada, MD;  [facility];  Service: Endoscopy;  Laterality: N/A;   HEMOSTASIS CLIP PLACEMENT  03/13/2020   Procedure: HEMOSTASIS CLIP PLACEMENT;  Surgeon: Carol Ada, MD;  [facility];  Service: Endoscopy;;   HEMOSTASIS CLIP PLACEMENT  12/18/2020   Procedure: HEMOSTASIS CLIP PLACEMENT;  Surgeon: Carol Ada, MD;  [facility];  Service: Endoscopy;;   HOT HEMOSTASIS N/A 03/13/2020   Procedure: HOT HEMOSTASIS (ARGON PLASMA COAGULATION/BICAP);  Surgeon: Carol Ada, MD;  [facility];  Service: Endoscopy;  Laterality: N/A;   HOT HEMOSTASIS N/A 12/18/2020   Procedure: HOT HEMOSTASIS (ARGON PLASMA COAGULATION/BICAP);  Surgeon: Carol Ada, MD;  [facility];  Service: Endoscopy;  Laterality: N/A;   IMPLANTABLE CARDIOVERTER DEFIBRILLATOR GENERATOR CHANGE  2008   INSERTION OF MESH N/A 07/09/2018   Procedure: INSERTION OF VICRYL MESH;  Surgeon: Judeth Horn, MD;  [facility];  Service: General;  Laterality: N/A;   LAPAROTOMY N/A 09/18/2017   Procedure: EXPLORATORY LAPAROTOMY;  Surgeon: Judeth Horn, MD;  Location: [facility];  Service:  General;  Laterality: N/A;   LEAD REVISION  06/22/2018   LEAD REVISION/REPAIR N/A 06/22/2018   Procedure: LEAD REVISION/REPAIR;  Surgeon: Deboraha Sprang, MD;  Location: [facility];  Service: Cardiovascular;  Laterality: N/A;   LYSIS OF ADHESION N/A 09/18/2017   Procedure: LYSIS OF ADHESION;  Surgeon: Judeth Horn, MD;  Location: [facility];  Service: General;  Laterality: N/A;   LYSIS OF ADHESION N/A 07/09/2018   Procedure: LYSIS OF ADHESION;  Surgeon: Judeth Horn, MD;  Location: [facility];  Service: General;  Laterality: N/A;   PARTIAL COLECTOMY N/A 09/18/2017   Procedure: ILEOCOLECTOMY;  Surgeon: Judeth Horn, MD;  Location: [facility];  Service: General;  Laterality: N/A;   PARTIAL COLECTOMY  09/25/2017   POLYPECTOMY  03/13/2020   Procedure: POLYPECTOMY;  Surgeon: Carol Ada, MD;  [facility];  Service: Endoscopy;;   RIGHT/LEFT HEART CATH AND CORONARY ANGIOGRAPHY N/A 06/01/2018   Procedure: RIGHT/LEFT HEART CATH AND CORONARY ANGIOGRAPHY;  Surgeon: Jettie Booze, MD;  Location: [facility];  Service: Cardiovascular;  Laterality: N/A;   SIGMOIDOSCOPY N/A 09/18/2017   Procedure: SIGMOIDOSCOPY;  Surgeon: Judeth Horn, MD;  Location: [facility];  Service: General;  Laterality: N/A;   SMALL INTESTINE SURGERY  07/09/2018   EXPLORATION OF  ABDOMINAL WOUND ERAS PATHWAYN/; MESH; LYSIS OF ADHESIONS   TOTAL ABDOMINAL HYSTERECTOMY  1990   TRANSFORAMINAL LUMBAR INTERBODY FUSION (TLIF) WITH PEDICLE SCREW FIXATION 2 LEVEL N/A 01/07/2020   Procedure: Lumbar Four-Five and Lumbar Five-Sacral One open lumbar decompression and transforaminal lumbar interbody fusion;  Surgeon: Judith Part, MD;  Location: [facility];  Service: Neurosurgery;  Laterality: N/A;  Lumbar Four-Five and Lumbar Five-Sacral One open lumbar decompression and transforaminal lumbar interbody fusion   TRIGGER FINGER RELEASE Right 05/213   TRIGGER FINGER RELEASE Right 04/08/2019   Procedure: RIGHT INDEX RELEASE TRIGGER  FINGER/A-1 PULLEY;  Surgeon: Milly Jakob, MD;  Location: [facility];  Service: Orthopedics;  Laterality: Right;   TUBAL LIGATION  1980s   VESICOVAGINAL FISTULA CLOSURE W/ TAH     Past Medical History:  Diagnosis Date   AICD (automatic cardioverter/defibrillator) present    high RV threshold chronically, device was turned off in 2014; Device battery has been dead x 7 years; "turned it back on 06/22/2018"   Anemia, iron deficiency    Angioedema    felt to likely be due to ace inhibitors but says she has had this even off of medicines, appears to be tolerating ARBs chronically   Anxiety    Arthritis    "hands, legs, arms; bad in my back" (06/22/2018)   Asthmatic bronchitis with status asthmaticus    Bradycardia    CHF (congestive heart failure) (HCC)    Chronic lower back pain    Chronic pain    CKD (chronic kidney disease), stage III (HCC)    Coronary artery disease    Mild nonobstructive (30% LAD, 30% RCA) by 09/01/16 cath [facility]   Degenerative joint disease    Depression    Diabetes mellitus, type 2 ([place])    Diabetic peripheral neuropathy (HCC)    Dizziness    Dyspnea on exertion    Family history of adverse reaction to anesthesia    Mother has nausea   GERD (gastroesophageal reflux disease)    Gout  Heart valve disorder    History of blood transfusion 1981; ~ 2005; 12/2016   "childbirth; defibrillator OR; colostomy OR"   History of mononucleosis 03/2014   Hyperlipidemia    Hypertension    Hypothyroid    Insomnia    Intervertebral disc degeneration    Ischemic cardiomyopathy    LBBB (left bundle branch block)    Myocardial infarction ([place]) dx'd ~ 2005   Nonischemic cardiomyopathy ([place])    s/p ICD in 2005. EF has since recovered   Obesity    On home oxygen therapy    "2L at night" (06/22/2018)   OSA on CPAP    Pneumonia    "couple times; last time was 12/2016" (06/22/2018)   PONV (postoperative nausea and vomiting)    Presence of permanent cardiac  pacemaker    [employer]   Swelling    Syncope    Systemic hypertension    Vitamin D deficiency    Ht '5\' 4"'$  (1.626 m)   Wt 250 lb (113.4 kg)   BMI 42.91 kg/m   Opioid Risk Score:   Fall Risk Score:  `1  Depression screen [facility] 2/9     09/27/2022   11:03 AM 08/02/2022   11:22 AM 07/19/2022   11:27 AM 06/21/2022   10:01 AM 03/08/2022   10:19 AM 01/26/2022    9:54 AM 01/03/2022   11:24 AM  Depression screen PHQ 2/9  Decreased Interest 0 0 0 0 0 0 0  Down, Depressed, Hopeless 0 0 0 0 0 0 0  PHQ - 2 Score 0 0 0 0 0 0 0    Review of Systems  Musculoskeletal:  Positive for back pain.       Pain in both knees & right leg, left wrist  All other systems reviewed and are negative.      Objective:   Physical Exam Vitals and nursing note reviewed.  Constitutional:      Appearance: Normal appearance.  Cardiovascular:     Rate and Rhythm: Normal rate and regular rhythm.     Pulses: Normal pulses.     Heart sounds: Normal heart sounds.  Pulmonary:     Effort: Pulmonary effort is normal.     Breath sounds: Normal breath sounds.  Musculoskeletal:     Cervical back: Normal range of motion and neck supple.     Comments: Normal Muscle Bulk and Muscle Testing Reveals:  Upper Extremities: Full ROM and Muscle Strength 5/5  Lumbar Paraspinal Tenderness: L-4-L-5 Lower Extremities: Full ROM and Muscle Strength 5/5 Right Lower Extremity Flexion Produces Pain into her Right Patella Arises from chair slowly using walker for support Antalgic Gait     Skin:    General: Skin is warm and dry.  Neurological:     Mental Status: She is alert and oriented to person, place, and time.  Psychiatric:        Mood and Affect: Mood normal.        Behavior: Behavior normal.         Assessment & Plan:  Chronic Low Back Pain: Encouraged to increase HEP as Tolerated. Continue to Monitor. 09/27/2022 2. Bilateral Primary Osteoarthritis:  S/P Right Knee Genicular nerve radiofrequency neurotomy,  with Dr Letta Pate. On 08/02/2022 and S/P Left Knee Genicular Nerve Radiofrequency on 08/16/2022. With good relief noted.  : Indication Chronic post operative pain in the Knee, pain postop total knee replacement which has not responded to conservative management such as physical therapy and medication management; Continue monitor.  09/27/2022 3. Myofascial Pain: S/P Trigger Point Injection on 06/29/2021 with Good Relief Noted. 09/27/2022 4. Chronic Pain Syndrome: Refilled: Increased Oxycodone 7.'5mg'$  /325 mg one tablet three times a day as needed for pain #90, Discontinue Hydrocodone due to itching, Crystal [name] verbalizes understanding.  We will continue the opioid monitoring program, this consists of regular clinic visits, examinations, urine drug screen, pill counts as well as use of [place] Controlled Substance Reporting system. A 12 month History has been reviewed on the [place] Controlled Substance Reporting System on 09/27/2022.   F/U in 1 month

## 2022-09-27 NOTE — Therapy (Signed)
OUTPATIENT PHYSICAL THERAPY TREATMENT NOTE/Discharged   Patient Name: Crystal [name] MRN: [id number] DOB:1951-06-22, 72 y.o., female Today's Date: 09/28/2022  PCP: Crist Infante MD  REFERRING PROVIDER: Alysia Penna MD    See note below for Objective Data and Assessment of Progress/Goals.    END OF SESSION:   PT End of Session - 09/28/22 1713     Visit Number 12    Date for PT Re-Evaluation 10/04/22    Authorization Type Medicare    PT Start Time 1635    PT Stop Time [id number]    PT Time Calculation (min) 45 min    Equipment Utilized During Treatment Other (comment)   RW   Activity Tolerance Patient tolerated treatment well    Behavior During Therapy WFL for tasks assessed/performed                   Past Medical History:  Diagnosis Date   AICD (automatic cardioverter/defibrillator) present    high RV threshold chronically, device was turned off in 2014; Device battery has been dead x 7 years; "turned it back on 06/22/2018"   Anemia, iron deficiency    Angioedema    felt to likely be due to ace inhibitors but says she has had this even off of medicines, appears to be tolerating ARBs chronically   Anxiety    Arthritis    "hands, legs, arms; bad in my back" (06/22/2018)   Asthmatic bronchitis with status asthmaticus    Bradycardia    CHF (congestive heart failure) (HCC)    Chronic lower back pain    Chronic pain    CKD (chronic kidney disease), stage III (HCC)    Coronary artery disease    Mild nonobstructive (30% LAD, 30% RCA) by 09/01/16 cath at [facility]   Degenerative joint disease    Depression    Diabetes mellitus, type 2 ([place])    Diabetic peripheral neuropathy ([place])    Dizziness    Dyspnea on exertion    Family history of adverse reaction to anesthesia    Mother has nausea   GERD (gastroesophageal reflux disease)    Gout    Heart valve disorder    History of blood transfusion 1981; ~ 2005; 12/2016   "childbirth; defibrillator OR; colostomy  OR"   History of mononucleosis 03/2014   Hyperlipidemia    Hypertension    Hypothyroid    Insomnia    Intervertebral disc degeneration    Ischemic cardiomyopathy    LBBB (left bundle branch block)    Myocardial infarction ([place]) dx'd ~ 2005   Nonischemic cardiomyopathy ([place])    s/p ICD in 2005. EF has since recovered   Obesity    On home oxygen therapy    "2L at night" (06/22/2018)   OSA on CPAP    Pneumonia    "couple times; last time was 12/2016" (06/22/2018)   PONV (postoperative nausea and vomiting)    Presence of permanent cardiac pacemaker    [employer]   Swelling    Syncope    Systemic hypertension    Vitamin D deficiency    Past Surgical History:  Procedure Laterality Date   APPENDECTOMY  07/13/2017   BLADDER SUSPENSION  1990   "w/hysterectomy"   BOWEL RESECTION N/A 07/09/2018   Procedure: SMALL BOWEL RESECTION;  Surgeon: Judeth Horn, MD;  Location: [place];  Service: General;  Laterality: N/A;   CARDIAC CATHETERIZATION  2005   no obstructive CAD per patient   CARDIAC CATHETERIZATION  2018  CARDIAC DEFIBRILLATOR PLACEMENT  2005   BiV ICD implanted,  LV lead is an epicardial lead   CARPAL TUNNEL RELEASE Left 07/25/2014   Dr.Williamson    CARPAL TUNNEL RELEASE Right 04/08/2019   Procedure: RIGHT CARPAL TUNNEL RELEASE ENDOSCOPIC;  Surgeon: Milly Jakob, MD;  Location: [place];  Service: Orthopedics;  Laterality: Right;   CATARACT EXTRACTION W/ INTRAOCULAR LENS  IMPLANT, BILATERAL Bilateral    COLONIC STENT PLACEMENT N/A 08/31/2017   Procedure: COLONIC STENT PLACEMENT;  Surgeon: Carol Ada, MD;  Location: [place];  Service: Endoscopy;  Laterality: N/A;   COLONOSCOPY     2010-2011 Dr.Kipreos    COLOSTOMY  12/2016   Archie Endo 01/26/2017   COLOSTOMY REVERSAL N/A 07/13/2017   Procedure: COLOSTOMY REVERSAL;  Surgeon: Judeth Horn, MD;  Location: [place];  Service: General;  Laterality: N/A;   CYST REMOVAL HAND Right 05/2013   thumb   DILATION AND CURETTAGE OF  UTERUS  X 5-6   ESOPHAGOGASTRODUODENOSCOPY (EGD) WITH PROPOFOL N/A 03/13/2020   Procedure: ESOPHAGOGASTRODUODENOSCOPY (EGD) WITH PROPOFOL;  Surgeon: Carol Ada, MD;  [facility];  Service: Endoscopy;  Laterality: N/A;   EXCISION MASS ABDOMINAL N/A 07/09/2018   Procedure: EXPLORATION OF  ABDOMINAL WOUND ERAS PATHWAY;  Surgeon: Judeth Horn, MD;  Location: [place];  Service: General;  Laterality: N/A;   FLEXIBLE SIGMOIDOSCOPY N/A 08/24/2017   Procedure: Beryle Quant;  Surgeon: Carol Ada, MD;  Location: [place];  Service: Endoscopy;  Laterality: N/A;   FLEXIBLE SIGMOIDOSCOPY N/A 08/31/2017   Procedure: FLEXIBLE SIGMOIDOSCOPY;  Surgeon: Carol Ada, MD;  Location: [place];  Service: Endoscopy;  Laterality: N/A;  stent placement   FLEXIBLE SIGMOIDOSCOPY N/A 03/13/2020   Procedure: FLEXIBLE SIGMOIDOSCOPY;  Surgeon: Carol Ada, MD;  [facility];  Service: Endoscopy;  Laterality: N/A;   FLEXIBLE SIGMOIDOSCOPY N/A 12/18/2020   Procedure: FLEXIBLE SIGMOIDOSCOPY;  Surgeon: Carol Ada, MD;  [facility];  Service: Endoscopy;  Laterality: N/A;   HEMOSTASIS CLIP PLACEMENT  03/13/2020   Procedure: HEMOSTASIS CLIP PLACEMENT;  Surgeon: Carol Ada, MD;  [facility];  Service: Endoscopy;;   HEMOSTASIS CLIP PLACEMENT  12/18/2020   Procedure: HEMOSTASIS CLIP PLACEMENT;  Surgeon: Carol Ada, MD;  [facility];  Service: Endoscopy;;   HOT HEMOSTASIS N/A 03/13/2020   Procedure: HOT HEMOSTASIS (ARGON PLASMA COAGULATION/BICAP);  Surgeon: Carol Ada, MD;  [facility];  Service: Endoscopy;  Laterality: N/A;   HOT HEMOSTASIS N/A 12/18/2020   Procedure: HOT HEMOSTASIS (ARGON PLASMA COAGULATION/BICAP);  Surgeon: Carol Ada, MD;  Location[facility];  Service: Endoscopy;  Laterality: N/A;   IMPLANTABLE CARDIOVERTER DEFIBRILLATOR GENERATOR CHANGE  2008   INSERTION OF MESH N/A 07/09/2018   Procedure: INSERTION OF VICRYL MESH;   Surgeon: Judeth Horn, MD;  Location: [place];  Service: General;  Laterality: N/A;   LAPAROTOMY N/A 09/18/2017   Procedure: EXPLORATORY LAPAROTOMY;  Surgeon: Judeth Horn, MD;  Location: [place];  Service: General;  Laterality: N/A;   LEAD REVISION  06/22/2018   LEAD REVISION/REPAIR N/A 06/22/2018   Procedure: LEAD REVISION/REPAIR;  Surgeon: Deboraha Sprang, MD;  Location: [place] [facility];  Service: Cardiovascular;  Laterality: N/A;   LYSIS OF ADHESION N/A 09/18/2017   Procedure: LYSIS OF ADHESION;  Surgeon: Judeth Horn, MD;  Location: [place];  Service: General;  Laterality: N/A;   LYSIS OF ADHESION N/A 07/09/2018   Procedure: LYSIS OF ADHESION;  Surgeon: Judeth Horn, MD;  Location: [place];  Service: General;  Laterality: N/A;   PARTIAL COLECTOMY  N/A 09/18/2017   Procedure: ILEOCOLECTOMY;  Surgeon: Judeth Horn, MD;  Location: [place];  Service: General;  Laterality: N/A;   PARTIAL COLECTOMY  09/25/2017   POLYPECTOMY  03/13/2020   Procedure: POLYPECTOMY;  Surgeon: Carol Ada, MD;  [facility];  Service: Endoscopy;;   RIGHT/LEFT HEART CATH AND CORONARY ANGIOGRAPHY N/A 06/01/2018   Procedure: RIGHT/LEFT HEART CATH AND CORONARY ANGIOGRAPHY;  Surgeon: Jettie Booze, MD;  Location: [place] [facility];  Service: Cardiovascular;  Laterality: N/A;   SIGMOIDOSCOPY N/A 09/18/2017   Procedure: SIGMOIDOSCOPY;  Surgeon: Judeth Horn, MD;  Location: [place];  Service: General;  Laterality: N/A;   SMALL INTESTINE SURGERY  07/09/2018   EXPLORATION OF  ABDOMINAL WOUND ERAS PATHWAYN/; MESH; LYSIS OF ADHESIONS   TOTAL ABDOMINAL HYSTERECTOMY  1990   TRANSFORAMINAL LUMBAR INTERBODY FUSION (TLIF) WITH PEDICLE SCREW FIXATION 2 LEVEL N/A 01/07/2020   Procedure: Lumbar Four-Five and Lumbar Five-Sacral One open lumbar decompression and transforaminal lumbar interbody fusion;  Surgeon: Judith Part, MD;  Location: [place];  Service: Neurosurgery;  Laterality: N/A;  Lumbar Four-Five and Lumbar Five-Sacral  One open lumbar decompression and transforaminal lumbar interbody fusion   TRIGGER FINGER RELEASE Right 05/213   TRIGGER FINGER RELEASE Right 04/08/2019   Procedure: RIGHT INDEX RELEASE TRIGGER FINGER/A-1 PULLEY;  Surgeon: Milly Jakob, MD;  Location: [place];  Service: Orthopedics;  Laterality: Right;   TUBAL LIGATION  1980s   VESICOVAGINAL FISTULA CLOSURE W/ TAH     Patient Active Problem List   Diagnosis Date Noted   Hypermagnesemia 10/27/2021   Diabetes mellitus without complication ([place]) [id number]   Hav (hallux abducto valgus), unspecified laterality 06/26/2020   Lumbar stenosis with neurogenic claudication 01/07/2020   Generalized abdominal pain    CKD (chronic kidney disease) stage 4, GFR 15-29 ml/min (HCC)    SBO (small bowel obstruction) ([place]) 08/17/2019   Hyponatremia 08/17/2019   COVID-19 virus infection 08/17/2019   Acute lower UTI 08/17/2019   Prolonged QT interval 08/17/2019   Acute hypoxemic respiratory failure due to COVID-19 ([place]) 08/07/2019   Normocytic anemia 08/07/2019   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   Congestive heart failure (CHF) ([place]) [id number]   Acute systolic heart failure ([place])    Type 2 diabetes mellitus with stage 3 chronic kidney disease, with long-term current use of insulin ([place]) 11/22/2017   Acute respiratory failure with hypoxia ([place]) 11/22/2017   Bowel obstruction ([place]) 08/23/2017   Colonic obstruction ([place]) 08/22/2017   Dehydration    Intractable vomiting with nausea    Hyperkalemia    Gastroenteritis 08/11/2017   Hypotension 08/11/2017   Acute renal failure superimposed on stage 3 chronic kidney disease ([place]) 08/11/2017   Hypovolemia 08/10/2017   Colostomy in place ([place]) 07/13/2017   Adjustment disorder with depressed mood    Debilitated 02/06/2017   S/P colostomy ([place]) 01/31/2017   Pressure injury of skin 01/28/2017   Colitis 01/26/2017   Acute MI ([place]) 05/05/2014   Anemia, iron deficiency 05/05/2014   Airway hyperreactivity  05/05/2014   Diabetes mellitus, type 2 ([place]) [id number]   Chronic systolic heart failure ([place]) 05/05/2014   HLD (hyperlipidemia) 05/05/2014   Hypothyroidism 05/05/2014   Nonischemic cardiomyopathy ([place]) 02/02/2014   LBBB (left bundle branch block) [id number]   Chronic systolic dysfunction of left ventricle 02/02/2014   Chronic renal insufficiency 02/02/2014   Essential hypertension 02/02/2014   Morbid obesity ([place]) 02/02/2014   Coronary artery disease 08/15/2013   Shock ([place]) 08/15/2013   History of prolonged Q-T interval on ECG  10/31/2012   Hx of cardiomyopathy 10/31/2012   Automatic implantable cardioverter-defibrillator in situ 08/06/2008    REFERRING DIAG: G57.02 (ICD-10-CM) - Piriformis syndrome of RIGHT side   THERAPY DIAG:  Pain in right hip  Decreased functional mobility and endurance  Other low back pain  Rationale for Evaluation and Treatment Rehabilitation  ONSET DATE: about 1 month ago   SUBJECTIVE:    SUBJECTIVE STATEMENT: Pt reports R buttock pain is higher today 6/10 due to rainy weather. Overall, her pain is much improved. Pt is pleased with her improvement with pain and function.   PAIN:  Are you having pain? Yes: NPRS scale: 6/10 Pain location: Rt buttock Pain description: gnawing pain  Aggravating factors: pressure Relieving factors: heating pad , laying down    PRECAUTIONS: None   WEIGHT BEARING RESTRICTIONS: No   FALLS:  Has patient fallen in last 6 months? No   LIVING ENVIRONMENT: Lives with: lives with their family Lives in: House/apartment Stairs: Yes: Internal: 13 steps; on right going up often crawls up there vs walking  Has following equipment at home: Single point cane, Walker - 2 wheeled, and bed side commode   OCCUPATION: RN Retired   PLOF: Independent with basic ADLs and Independent with household mobility with device   PATIENT GOALS: Patient wants to relieve this pain .    NEXT MD VISIT: Next week for L knee.     OBJECTIVE: (objective measures completed at initial evaluation unless otherwise dated)    DIAGNOSTIC FINDINGS: none    PATIENT SURVEYS:  FOTO 31%. 08/23/22=47% 09/01/22=53%   COGNITION: Overall cognitive status: Within functional limits for tasks assessed                         SENSATION: WFL   EDEMA:  NT   MUSCLE LENGTH: Hamstrings: Right tight, painful  deg; Left tight deg   POSTURE: rounded shoulders and flexed trunk    PALPATION: Patient with exquisite pain in the lower L 5 to S1, SIJ border extending laterally to outer hip.   LOWER EXTREMITY ROM:   Passive ROM Right eval Left eval  Hip flexion      Hip extension      Hip abduction      Hip adduction      Hip internal rotation 10-15 deg 15-20 deg   Hip external rotation good good  Knee flexion      Knee extension      Ankle dorsiflexion      Ankle plantarflexion      Ankle inversion      Ankle eversion       (Blank rows = not tested)   LOWER EXTREMITY MMT:   MMT Right eval Left eval  Hip flexion 4+/5 4/5  Hip extension      Hip abduction 3-/5 3-/5  Hip adduction      Hip internal rotation      Hip external rotation      Knee flexion 5/5 5/5  Knee extension 5/5 5/5  Ankle dorsiflexion      Ankle plantarflexion      Ankle inversion      Ankle eversion       (Blank rows = not tested)   LOWER EXTREMITY SPECIAL TESTS:  Hip special tests: NT due to pain    FUNCTIONAL TESTS:  5 times sit to stand: 24 sec 09/01/22=11.7" 2 minute walk test: NT on eval 08/17/22=340"   GAIT: Distance walked: 150 Assistive  device utilized: Environmental consultant - 2 wheeled Level of assistance: SBA  Comments: walker pushed far out from  upper body    TODAY'S TREATMENT: [facility] Adult PT Treatment:                                                DATE: 09/28/22 Therapeutic Exercise: 2MWT Seated hamstring and piriformis stretches Standing hip abduction 5# 10 x 2 each  Standing knee flexion 5# 10 x 2  Seated LAQ 5# x 20 each LTR x5   Supine banded bridge GTB 2 x 10 Supine clam blue band bilateral GTB 2x10 Final HEP Manual Therapy: STM/DTM to the R piriformis and proximal posterior thigh muscles inserting on the posterior ischial tuberosity Skilled palpation to identify TrPs and tait muscle bands  Trigger Point Dry Needling Treatment: Pre-treatment instruction: Patient instructed on dry needling rationale, procedures, and possible side effects including pain during treatment (achy,cramping feeling), bruising, drop of blood, lightheadedness, nausea, sweating. Patient Consent Given: Yes Education handout provided: Yes Muscles treated: R piriformis; R medial and lateral hamstrings Needle size and number: .30x.75 mm x 1;  Electrical stimulation performed: No30x65m x1 Parameters: N/A Treatment response/outcome: Twitch response elicited Post-treatment instructions: Patient instructed to expect possible mild to moderate muscle soreness later today and/or tomorrow. Patient instructed in methods to reduce muscle soreness and to continue prescribed HEP. If patient was dry needled over the lung field, patient was instructed on signs and symptoms of pneumothorax and, however unlikely, to see immediate medical attention should they occur. Patient was also educated on signs and symptoms of infection and to seek medical attention should they occur. Patient verbalized understanding of these instructions and education.  [facility]                                                DATE: 09/22/22 Therapeutic Exercise: Banded Bridging BluTB 2x10 Supine clams BluTB 2x10 SLS standing x5 30" Hamstring curl 2x10 5# Manual Therapy: STM/DTM to the R piriformis and proximal muscles inserting on the posterior ischial tuberosity Contract relax stretching for hamstrings and hip ERs  [facility]                                                DATE: 09/20/22 Therapeutic Exercise: Prone leg curls 2x10 3# Prone leg lifts 2x10  3# Seated hamstring stretch x3 30" STS x10 bari-mat Seated R hip clam GTB 3x10 Manual Therapy: STM/DTM to the R medial and lateral hamstring Skilled palpation to identify taut muscle bands and TrPs Trigger Point Dry Needling Treatment: Pre-treatment instruction: Patient instructed on dry needling rationale, procedures, and possible side effects including pain during treatment (achy,cramping feeling), bruising, drop of blood, lightheadedness, nausea, sweating. Patient Consent Given: Yes Education handout provided: Yes Muscles treated: R medial and lateral hamstrings  Needle size and number: .30x.75 mm x 1 Electrical stimulation performed: No Parameters: N/A Treatment response/outcome: Twitch response elicited Post-treatment instructions: Patient instructed to expect possible mild to moderate muscle soreness later today and/or tomorrow. Patient instructed in methods to reduce muscle soreness and to continue prescribed  HEP. If patient was dry needled over the lung field, patient was instructed on signs and symptoms of pneumothorax and, however unlikely, to see immediate medical attention should they occur. Patient was also educated on signs and symptoms of infection and to seek medical attention should they occur. Patient verbalized understanding of these instructions and education.  [place] Adult PT Treatment:                                                DATE: 09/14/22 Therapeutic Exercise: Nustep L5 x 5 minutes Seated hamstring and piriformis stretches Standing hip abduction 5# 10 x 2 each  Standing knee flexion 5# 10 x 2  Seated LAQ 5# x 20 each  Supine bridge x 10 Supine clam blue band bilateral and alternating Supine hamstring stretch right using strap  Therapeutic Activity: Step to pattern 8 stairs with 1UE -                                                             PATIENT EDUCATION:  Education details: POC, HEP, differential diagnosis  Person educated: Patient Education  method: Explanation and Demonstration Education comprehension: verbalized understanding   HOME EXERCISE PROGRAM: Access Code: [facility] URL: [email or web address] Date: 08/23/2022 Prepared by: Gar Ponto  Exercises - Supine Lower Trunk Rotation  - 1 x daily - 7 x weekly - 2 sets - 10 reps - 10 hold - Seated Hamstring Stretch  - 1 x daily - 7 x weekly - 1 sets - 3 reps - 30 hold - Seated Piriformis Stretch  - 1 x daily - 7 x weekly - 1 sets - 3 reps - 20-30 hold - Standing Hip Flexor Stretch  - 1 x daily - 7 x weekly - 1 sets - 3 reps - 20-30 hold - Bridge with Hip Abduction and Resistance  - 1 x daily - 7 x weekly - 2 sets - 10 reps - 5 hold - Clamshell with Resistance  - 1 x daily - 7 x weekly - 2 sets - 10 reps - 5 hold - Sidelying Hip Abduction  - 1 x daily - 7 x weekly - 2 sets - 10 reps - 5 hold - Active Straight Leg Raise with Quad Set  - 1 x daily - 7 x weekly - 2 sets - 10 reps - 5 hold   ASSESSMENT:   CLINICAL IMPRESSION: Reassess 2MWT which was improved vs initial test. Today is the pt's last PT and she has made very good progress [id number] and improved function. All PT goals have been met. Today manual therapy of the R gluteal and and hamstrings muscles was f/b TPDN. Flexibility and strengthening therex for the lumbopelvic muscles were completed for activation. Pt reported a decrease in pain which she notes occurs with rainy weather.Pt is Dced to a HEP to maintain her LOF.  OBJECTIVE IMPAIRMENTS: cardiopulmonary status limiting activity, decreased activity tolerance, decreased endurance, decreased mobility, difficulty walking, decreased ROM, decreased strength, increased fascial restrictions, postural dysfunction, obesity, and pain.    ACTIVITY LIMITATIONS: carrying, lifting, bending, sitting, standing, squatting, sleeping, stairs, transfers, bed mobility, and locomotion level   PARTICIPATION LIMITATIONS: shopping and community  activity   PERSONAL FACTORS: Age,  Fitness, and 3+ comorbidities: Polyarthralgia, obesity, defibrillator, diabetes  are also affecting patient's functional outcome.    REHAB POTENTIAL: Good   CLINICAL DECISION MAKING: Stable/uncomplicated   EVALUATION COMPLEXITY: Low     GOALS: Goals reviewed with patient? Yes   SHORT TERM GOALS: Target date: 09/06/2022   Patient will be independent in initial home exercise program Baseline: Goal status:  MET   2.  Patient will be able to sit more symmetrically on the right hip to develop normal movement patterns Baseline:  Goal status: MET   3.  Patient will complete functional testing goals set (2-minute walk, TUG) Baseline:  Goal status: MET   4.  Patient will begin to tolerate standing for up to 10 minutes to positively impact ADLs and quality of life Baseline:  Status: 08/23/22=pt estimates 10 mins Goal status: MET    LONG TERM GOALS: Target date: 10/04/2022     Patient will improve FOTO score to 50% or greater to demonstrate improved functional mobility Baseline: 31% Goal status: Met 53% 09/01/22   2.  Patient will perform bed mobility without increased back and hip pain >50% of the time  Baseline:  09/14/22: pain with bed mobility in clinic Status: pt reports 80% improvement Goal status: MET  3.  Patient will be able to walk up stairs with modified independence using 1 rail, preferred pattern/ Baseline: often uses hands on step above to crawl  09/14/22: can perform step to pattern, 1 rail in clinic, has not tried at home Status: Pt is able to go up/down steps c 1 HR Goal status: MET   4.  2 min walk test TBA /other goals  Baseline: 08/17/22=340' Goal status: Improved 370'    5.  Patient will be independent with home exercise program upon discharge Baseline:  Goal status: MET       PLAN:   PT FREQUENCY: 2x/week   PT DURATION: 8 weeks   PLANNED INTERVENTIONS: Therapeutic exercises, Therapeutic activity, Neuromuscular re-education, Balance training, Gait  training, Patient/Family education, Self Care, Joint mobilization, Stair training, Aquatic Therapy, Cryotherapy, Moist heat, Ionotophoresis '4mg'$ /ml Dexamethasone, Manual therapy, and Re-evaluation   PLAN FOR NEXT SESSION: Progress note; 2-minute walk test.  NuStep.  Check home exercise program.  Consider manual therapy in side-lying to right hip/trunk   PHYSICAL THERAPY DISCHARGE SUMMARY  Visits from Start of Care:12  Current functional level related to goals / functional outcomes: See clinical impression and PT goals   Remaining deficits: See clinical impression and PT goals    Education / Equipment: HEP   Patient agrees to discharge. Patient goals were met. Patient is being discharged due to being pleased with the current functional level.    Donovin Kraemer MS, PT 09/28/22 5:50 PM

## 2022-09-27 NOTE — Patient Instructions (Addendum)
Keep a Pain Journal:  Send a message to My- Chart with Update : In 2 weeks   Your Oxycodone dose was  change Today

## 2022-09-28 ENCOUNTER — Ambulatory Visit: Payer: Medicare Other | Attending: Internal Medicine

## 2022-09-28 DIAGNOSIS — M25551 Pain in right hip: Secondary | ICD-10-CM | POA: Diagnosis not present

## 2022-09-28 DIAGNOSIS — M5459 Other low back pain: Secondary | ICD-10-CM | POA: Diagnosis not present

## 2022-09-28 DIAGNOSIS — Z7409 Other reduced mobility: Secondary | ICD-10-CM | POA: Diagnosis not present

## 2022-09-28 DIAGNOSIS — E119 Type 2 diabetes mellitus without complications: Secondary | ICD-10-CM | POA: Diagnosis not present

## 2022-09-30 LAB — DRUG TOX MONITOR 1 W/CONF, ORAL FLD
Amphetamines: NEGATIVE ng/mL (ref <10)
Barbiturates: NEGATIVE ng/mL (ref <10)
Benzodiazepines: NEGATIVE ng/mL (ref <0.50)
Buprenorphine: NEGATIVE ng/mL (ref <0.10)
Cocaine: NEGATIVE ng/mL (ref <5.0)
Codeine: NEGATIVE ng/mL (ref <2.5)
Dihydrocodeine: NEGATIVE ng/mL (ref <2.5)
Fentanyl: NEGATIVE ng/mL (ref <0.10)
Heroin Metabolite: NEGATIVE ng/mL (ref <1.0)
Hydrocodone: NEGATIVE ng/mL (ref <2.5)
Hydromorphone: NEGATIVE ng/mL (ref <2.5)
MARIJUANA: NEGATIVE ng/mL (ref <2.5)
MDMA: NEGATIVE ng/mL (ref <10)
Meprobamate: NEGATIVE ng/mL (ref <2.5)
Methadone: NEGATIVE ng/mL (ref <5.0)
Morphine: NEGATIVE ng/mL (ref <2.5)
Nicotine Metabolite: NEGATIVE ng/mL (ref <5.0)
Norhydrocodone: NEGATIVE ng/mL (ref <2.5)
Noroxycodone: 2.5 ng/mL — ABNORMAL HIGH (ref <2.5)
Opiates: POSITIVE ng/mL — AB (ref <2.5)
Oxycodone: 135.6 ng/mL — ABNORMAL HIGH (ref <2.5)
Oxymorphone: NEGATIVE ng/mL (ref <2.5)
Phencyclidine: NEGATIVE ng/mL (ref <10)
Tapentadol: NEGATIVE ng/mL (ref <5.0)
Tramadol: NEGATIVE ng/mL (ref <5.0)
Zolpidem: NEGATIVE ng/mL (ref <5.0)

## 2022-09-30 LAB — DRUG TOX ALC METAB W/CON, ORAL FLD: Alcohol Metabolite: NEGATIVE ng/mL (ref <25)

## 2022-10-06 ENCOUNTER — Telehealth: Payer: Self-pay | Admitting: *Deleted

## 2022-10-06 NOTE — Telephone Encounter (Signed)
Oral swab drug screen was consistent for prescribed medications.  ?

## 2022-10-12 ENCOUNTER — Ambulatory Visit: Payer: Self-pay

## 2022-10-12 NOTE — Patient Instructions (Signed)
Visit Information  Thank you for taking time to visit with me today. Please don't hesitate to contact me if I can be of assistance to you.   Following are the goals we discussed today:   Goals Addressed               This Visit's Progress     Patient Stated     I would like to continue to improve my diabetes (pt-stated)        Care Coordination Interventions: Evaluation of current treatment plan related to type 2 diabetes and patient's adherence to plan as established by provider Review of patient status, including review of consultants reports, relevant laboratory and other test results, and medications completed Advised patient, providing education and rationale, to check cbg daily before meals and at bedtime and record, calling PCP for findings outside established parameters Determined patient is experiencing blood sugars within normal range, FBS 80-130, <180 after meals Positive reinforcement provided to patient for making efforts to improve her diabetes and overall health   Lab Results  Component Value Date   HGBA1C 8.5 (H) 10/27/2021           Other     continue to get good pain control        Care Coordination Interventions: Reviewed provider established plan for pain management Determined patient completed her outpatient PT, she has been discharged from this service Educated patient on the importance of adherence to her home exercise plan  Educated patient about the PREP program and patient would like to participate Placed outbound call to Dr. Joylene Draft, PCP, left vm for Maudie Mercury RN requesting assistance with placing PREP referral            Our next appointment is by telephone on 10/28/22 at 11:00 AM  Please call the care guide team at [phone] if you need to cancel or reschedule your appointment.   If you are experiencing a Mental Health or [place] or need someone to talk to, please call 1-800-273-TALK (toll free, 24 hour hotline) go to  [facility] [address], [place] [phone])  Patient verbalizes understanding of instructions and care plan provided today and agrees to view in [place]. Active MyChart status and patient understanding of how to access instructions and care plan via MyChart confirmed with patient.     Barb Merino, RN, BSN, CCM Care Management Coordinator [facility] Direct Phone: [phone]

## 2022-10-12 NOTE — Patient Outreach (Signed)
  Care Coordination   Follow Up Visit Note   10/12/2022 Name: Crystal [name] MRN: [id number] DOB: 04/17/1951  Crystal [name] is a 72 y.o. year old female who sees Crist Infante, MD for primary care. I spoke with  Crystal [name] by phone today.  What matters to the patients health and wellness today?  Patient will continue to adhere to her diabetic diet as directed. She would like to participate in PREP.     Goals Addressed               This Visit's Progress     Patient Stated     I would like to continue to improve my diabetes (pt-stated)        Care Coordination Interventions: Evaluation of current treatment plan related to type 2 diabetes and patient's adherence to plan as established by provider Review of patient status, including review of consultants reports, relevant laboratory and other test results, and medications completed Advised patient, providing education and rationale, to check cbg daily before meals and at bedtime and record, calling PCP for findings outside established parameters Determined patient is experiencing blood sugars within normal range, FBS 80-130, <180 after meals Positive reinforcement provided to patient for making efforts to improve her diabetes and overall health   Lab Results  Component Value Date   HGBA1C 8.5 (H) 10/27/2021           Other     continue to get good pain control        Care Coordination Interventions: Reviewed provider established plan for pain management Determined patient completed her outpatient PT, she has been discharged from this service Educated patient on the importance of adherence to her home exercise plan  Educated patient about the PREP program and patient would like to participate Placed outbound call to Dr. Joylene Draft, PCP, left vm for Maudie Mercury RN requesting assistance with placing PREP referral        Interventions Today    Flowsheet Row Most Recent Value  Chronic Disease   Chronic disease during today's visit  Diabetes, Other  [chronic knee pain]  General Interventions   General Interventions Discussed/Reviewed General Interventions Discussed, General Interventions Reviewed, Doctor Visits  Doctor Visits Discussed/Reviewed Doctor Visits Discussed  Exercise Interventions   Exercise Discussed/Reviewed Exercise Discussed, Exercise Reviewed, Physical Activity  Physical Activity Discussed/Reviewed PREP, Home Exercise Program (HEP)  Education Interventions   Education Provided Provided Education  Provided Verbal Education On Blood Sugar Monitoring, Nutrition, Exercise, When to see the doctor  Nutrition Interventions   Nutrition Discussed/Reviewed Nutrition Discussed, Nutrition Reviewed, Carbohydrate meal planning  Safety Interventions   Safety Discussed/Reviewed Safety Discussed          SDOH assessments and interventions completed:  No     Care Coordination Interventions:  Yes, provided   Follow up plan: Follow up call scheduled for 10/28/22 @011 :00 AM    Encounter Outcome:  Pt. Visit Completed

## 2022-10-18 ENCOUNTER — Telehealth: Payer: Self-pay

## 2022-10-18 NOTE — Telephone Encounter (Signed)
Zella Ball NP is out of the office this week:  Mikenzie L. Carlsson called to report the increase of Oxycodone is working. She is now having a 4 hour relief of pain.   Call back phone [phone].

## 2022-10-19 ENCOUNTER — Telehealth: Payer: Self-pay

## 2022-10-19 NOTE — Patient Outreach (Signed)
Received a referral for the PREP program from Dr. Joylene Draft.    I have sent a referral to the [facility] [place] [place] [facility] [phone]

## 2022-10-21 ENCOUNTER — Telehealth: Payer: Self-pay

## 2022-10-21 NOTE — Telephone Encounter (Signed)
Call to pt reference referral to [facility] program to pt and closest location Is interested in participating, will need a 1030a class.  Next [facility] class at 1030a will likely be summer. If someone cancels out of April's class will call her. Pt agreeable to plan.  Requested info sent to email. Will send flyer as well as info about class. She will have my contact info also.

## 2022-10-21 NOTE — Progress Notes (Signed)
Remote ICD transmission.   

## 2022-10-25 DIAGNOSIS — M25532 Pain in left wrist: Secondary | ICD-10-CM | POA: Diagnosis not present

## 2022-10-25 DIAGNOSIS — M654 Radial styloid tenosynovitis [de Quervain]: Secondary | ICD-10-CM | POA: Diagnosis not present

## 2022-10-26 ENCOUNTER — Encounter: Payer: Self-pay | Admitting: Registered Nurse

## 2022-10-26 ENCOUNTER — Encounter: Payer: Medicare Other | Attending: Registered Nurse | Admitting: Registered Nurse

## 2022-10-26 VITALS — BP 146/69 | HR 86 | Ht 64.0 in | Wt 245.0 lb

## 2022-10-26 DIAGNOSIS — Z5181 Encounter for therapeutic drug level monitoring: Secondary | ICD-10-CM | POA: Diagnosis not present

## 2022-10-26 DIAGNOSIS — M2550 Pain in unspecified joint: Secondary | ICD-10-CM | POA: Diagnosis not present

## 2022-10-26 DIAGNOSIS — G894 Chronic pain syndrome: Secondary | ICD-10-CM

## 2022-10-26 DIAGNOSIS — M545 Low back pain, unspecified: Secondary | ICD-10-CM

## 2022-10-26 DIAGNOSIS — M170 Bilateral primary osteoarthritis of knee: Secondary | ICD-10-CM | POA: Insufficient documentation

## 2022-10-26 DIAGNOSIS — Z79891 Long term (current) use of opiate analgesic: Secondary | ICD-10-CM | POA: Diagnosis not present

## 2022-10-26 DIAGNOSIS — G8929 Other chronic pain: Secondary | ICD-10-CM | POA: Insufficient documentation

## 2022-10-26 MED ORDER — OXYCODONE-ACETAMINOPHEN 7.5-325 MG PO TABS: 1.0000 | ORAL_TABLET | Freq: Three times a day (TID) | ORAL | 0 refills | Status: DC | PRN

## 2022-10-26 NOTE — Progress Notes (Signed)
Subjective:    Patient ID: Crystal [name], female    DOB: 1951/05/22, 72 y.o.   MRN: [id number]  HPI: Crystal [name] is a 72 y.o. female who returns for follow up appointment for chronic pain and medication refill. She states her pain is located in her lower back and bilateral knee pain R>L. She also reports generalized joint pain. She rates her pain 6. Her current exercise regime is walking and performing stretching exercises.  Ms. Crystal [name] Morphine equivalent is 33.75 MME.   Last Oral Swab was Performed on 09/27/2022   Pain Inventory Average Pain 9 Pain Right Now 6 My pain is sharp, burning, and stabbing  In the last 24 hours, has pain interfered with the following? General activity 8 Relation with others 6 Enjoyment of life 8 What TIME of day is your pain at its worst? morning , evening, and night Sleep (in general) Good  Pain is worse with: walking, bending, standing, and some activites Pain improves with: rest, therapy/exercise, medication, and injections Relief from Meds: 8  Family History  Problem Relation Age of Onset   Hypertension Father    Heart disease Father    Cancer Father        prostate   Heart disease Other        (Maternal side) Ischemic heart disease   Diabetes Mellitus II Other    Arrhythmia Mother    Diabetes Mellitus II Mother        Borderline DM   Hypertension Mother    Asthma Mother    Cancer Brother    Dementia Brother    Hypertension Brother    Hypertension Daughter    Hypertension Daughter    Diabetes Daughter    Hypertension Daughter    Atrial fibrillation Daughter    GER disease Daughter    Hypertension Son    Anxiety disorder Son    Hypothyroidism Son    Breast cancer Maternal Grandmother        in her 77's   Social History   Socioeconomic History   Marital status: Widowed    Spouse name: Not on file   Number of children: Not on file   Years of education: Not on file   Highest education level: Not on file  Occupational  History    Comment: retired LPN  Tobacco Use   Smoking status: Never   Smokeless tobacco: Never  Vaping Use   Vaping Use: Never used  Substance and Sexual Activity   Alcohol use: Not Currently   Drug use: Never   Sexual activity: Not Currently  Other Topics Concern   Not on file  Social History Narrative   Pt lives in [place] alone.  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      As of 03/15/17:   Diet: 1800 Calorie      Caffeine: Yes      Married, if yes what year: Widowed, married 1973      Do you live in a house, apartment, assisted living, condo, trailer, ect: House, 1 stories, and 1 person      Pets: No      Current/Past profession: LPN, retired       Exercise: Yes, walking          Living Will: Yes   DNR: No   POA/HPOA: Yes      Functional Status:   Do you have difficulty bathing or dressing yourself? No  Do you have difficulty preparing food or eating? No   Do you have difficulty managing your medications? No   Do you have difficulty managing your finances? No   Do you have difficulty affording your medications? Yes   Social Determinants of Health   Financial Resource Strain: Low Risk  (10/11/2017)   Overall Financial Resource Strain (CARDIA)    Difficulty of Paying Living Expenses: Not hard at all  Food Insecurity: No Food Insecurity (01/03/2022)   Hunger Vital Sign    Worried About Running Out of Food in the Last Year: Never true    Ran Out of Food in the Last Year: Never true  Transportation Needs: No Transportation Needs (01/03/2022)   PRAPARE - Hydrologist (Medical): No    Lack of Transportation (Non-Medical): No  Physical Activity: Inactive (10/11/2017)   Exercise Vital Sign    Days of Exercise per Week: 0 days    Minutes of Exercise per Session: 0 min  Stress: Stress Concern Present (10/11/2017)   [place]    Feeling of Stress : To some extent  Social Connections: Somewhat Isolated (10/11/2017)   Social Connection and Isolation Panel [NHANES]    Frequency of Communication with Friends and Family: More than three times a week    Frequency of Social Gatherings with Friends and Family: More than three times a week    Attends Religious Services: More than 4 times per year    Active Member of [employer] or Organizations: No    Attends Archivist Meetings: Never    Marital Status: Widowed   Past Surgical History:  Procedure Laterality Date   APPENDECTOMY  07/13/2017   BLADDER SUSPENSION  1990   "w/hysterectomy"   BOWEL RESECTION N/A 07/09/2018   Procedure: SMALL BOWEL RESECTION;  Surgeon: Judeth Horn, MD;  Location: [facility];  Service: General;  Laterality: N/A;   CARDIAC CATHETERIZATION  2005   no obstructive CAD per patient   CARDIAC CATHETERIZATION  2018   CARDIAC DEFIBRILLATOR PLACEMENT  2005   BiV ICD implanted,  LV lead is an epicardial lead   CARPAL TUNNEL RELEASE Left 07/25/2014   Dr.Williamson    CARPAL TUNNEL RELEASE Right 04/08/2019   Procedure: RIGHT CARPAL TUNNEL RELEASE ENDOSCOPIC;  Surgeon: Milly Jakob, MD;  Location: [facility];  Service: Orthopedics;  Laterality: Right;   CATARACT EXTRACTION W/ INTRAOCULAR LENS  IMPLANT, BILATERAL Bilateral    COLONIC STENT PLACEMENT N/A 08/31/2017   Procedure: COLONIC STENT PLACEMENT;  Surgeon: Carol Ada, MD;  Location: [place];  Service: Endoscopy;  Laterality: N/A;   COLONOSCOPY     2010-2011 Dr.Kipreos    COLOSTOMY  12/2016   Archie Endo 01/26/2017   COLOSTOMY REVERSAL N/A 07/13/2017   Procedure: COLOSTOMY REVERSAL;  Surgeon: Judeth Horn, MD;  Location: [facility];  Service: General;  Laterality: N/A;   CYST REMOVAL HAND Right 05/2013   thumb   DILATION AND CURETTAGE OF UTERUS  X 5-6   ESOPHAGOGASTRODUODENOSCOPY (EGD) WITH PROPOFOL N/A 03/13/2020   Procedure: ESOPHAGOGASTRODUODENOSCOPY (EGD) WITH PROPOFOL;  Surgeon: Carol Ada, MD;  [facility];  Service: Endoscopy;  Laterality: N/A;   EXCISION MASS ABDOMINAL N/A 07/09/2018   Procedure: EXPLORATION OF  ABDOMINAL WOUND ERAS PATHWAY;  Surgeon: Judeth Horn, MD;  Location: [facility];  Service: General;  Laterality: N/A;   FLEXIBLE SIGMOIDOSCOPY N/A 08/24/2017   Procedure: Beryle Quant;  Surgeon: Carol Ada, MD;  Location: [facility];  Service: Endoscopy;  Laterality: N/A;   FLEXIBLE SIGMOIDOSCOPY N/A 08/31/2017   Procedure: FLEXIBLE SIGMOIDOSCOPY;  Surgeon: Carol Ada, MD;  Location: [facility];  Service: Endoscopy;  Laterality: N/A;  stent placement   FLEXIBLE SIGMOIDOSCOPY N/A 03/13/2020   Procedure: FLEXIBLE SIGMOIDOSCOPY;  Surgeon: Carol Ada, MD;  [facility];  Service: Endoscopy;  Laterality: N/A;   FLEXIBLE SIGMOIDOSCOPY N/A 12/18/2020   Procedure: FLEXIBLE SIGMOIDOSCOPY;  Surgeon: Carol Ada, MD;  [facility];  Service: Endoscopy;  Laterality: N/A;   HEMOSTASIS CLIP PLACEMENT  03/13/2020   Procedure: HEMOSTASIS CLIP PLACEMENT;  Surgeon: Carol Ada, MD;  [facility];  Service: Endoscopy;;   HEMOSTASIS CLIP PLACEMENT  12/18/2020   Procedure: HEMOSTASIS CLIP PLACEMENT;  Surgeon: Carol Ada, MD;  [facility];  Service: Endoscopy;;   HOT HEMOSTASIS N/A 03/13/2020   Procedure: HOT HEMOSTASIS (ARGON PLASMA COAGULATION/BICAP);  Surgeon: Carol Ada, MD;  Location[facility];  Service: Endoscopy;  Laterality: N/A;   HOT HEMOSTASIS N/A 12/18/2020   Procedure: HOT HEMOSTASIS (ARGON PLASMA COAGULATION/BICAP);  Surgeon: Carol Ada, MD;  Location[facility];  Service: Endoscopy;  Laterality: N/A;   IMPLANTABLE CARDIOVERTER DEFIBRILLATOR GENERATOR CHANGE  2008   INSERTION OF MESH N/A 07/09/2018   Procedure: INSERTION OF VICRYL MESH;  Surgeon: Judeth Horn, MD;  Location: [place];  Service: General;  Laterality: N/A;   LAPAROTOMY N/A 09/18/2017   Procedure: EXPLORATORY LAPAROTOMY;   Surgeon: Judeth Horn, MD;  Location: [facility];  Service: General;  Laterality: N/A;   LEAD REVISION  06/22/2018   LEAD REVISION/REPAIR N/A 06/22/2018   Procedure: LEAD REVISION/REPAIR;  Surgeon: Deboraha Sprang, MD;  Location: [facility];  Service: Cardiovascular;  Laterality: N/A;   LYSIS OF ADHESION N/A 09/18/2017   Procedure: LYSIS OF ADHESION;  Surgeon: Judeth Horn, MD;  Location: [facility];  Service: General;  Laterality: N/A;   LYSIS OF ADHESION N/A 07/09/2018   Procedure: LYSIS OF ADHESION;  Surgeon: Judeth Horn, MD;  Location: [place];  Service: General;  Laterality: N/A;   PARTIAL COLECTOMY N/A 09/18/2017   Procedure: ILEOCOLECTOMY;  Surgeon: Judeth Horn, MD;  Location: [facility];  Service: General;  Laterality: N/A;   PARTIAL COLECTOMY  09/25/2017   POLYPECTOMY  03/13/2020   Procedure: POLYPECTOMY;  Surgeon: Carol Ada, MD;  [facility];  Service: Endoscopy;;   RIGHT/LEFT HEART CATH AND CORONARY ANGIOGRAPHY N/A 06/01/2018   Procedure: RIGHT/LEFT HEART CATH AND CORONARY ANGIOGRAPHY;  Surgeon: Jettie Booze, MD;  Location: [facility];  Service: Cardiovascular;  Laterality: N/A;   SIGMOIDOSCOPY N/A 09/18/2017   Procedure: SIGMOIDOSCOPY;  Surgeon: Judeth Horn, MD;  Location: [facility];  Service: General;  Laterality: N/A;   SMALL INTESTINE SURGERY  07/09/2018   EXPLORATION OF  ABDOMINAL WOUND ERAS PATHWAYN/; MESH; LYSIS OF ADHESIONS   TOTAL ABDOMINAL HYSTERECTOMY  1990   TRANSFORAMINAL LUMBAR INTERBODY FUSION (TLIF) WITH PEDICLE SCREW FIXATION 2 LEVEL N/A 01/07/2020   Procedure: Lumbar Four-Five and Lumbar Five-Sacral One open lumbar decompression and transforaminal lumbar interbody fusion;  Surgeon: Judith Part, MD;  Location: [facility] [place];  Service: Neurosurgery;  Laterality: N/A;  Lumbar Four-Five and Lumbar Five-Sacral One open lumbar decompression and transforaminal lumbar interbody fusion   TRIGGER FINGER RELEASE Right 05/213   TRIGGER FINGER RELEASE Right  04/08/2019   Procedure: RIGHT INDEX RELEASE TRIGGER FINGER/A-1 PULLEY;  Surgeon: Milly Jakob, MD;  Location: [facility];  Service: Orthopedics;  Laterality: Right;   TUBAL LIGATION  1980s   VESICOVAGINAL  FISTULA CLOSURE W/ TAH     Past Surgical History:  Procedure Laterality Date   APPENDECTOMY  07/13/2017   BLADDER SUSPENSION  1990   "w/hysterectomy"   BOWEL RESECTION N/A 07/09/2018   Procedure: SMALL BOWEL RESECTION;  Surgeon: Judeth Horn, MD;  Location: [facility];  Service: General;  Laterality: N/A;   CARDIAC CATHETERIZATION  2005   no obstructive CAD per patient   CARDIAC CATHETERIZATION  2018   CARDIAC DEFIBRILLATOR PLACEMENT  2005   BiV ICD implanted,  LV lead is an epicardial lead   CARPAL TUNNEL RELEASE Left 07/25/2014   Dr.Williamson    CARPAL TUNNEL RELEASE Right 04/08/2019   Procedure: RIGHT CARPAL TUNNEL RELEASE ENDOSCOPIC;  Surgeon: Milly Jakob, MD;  Location: [facility];  Service: Orthopedics;  Laterality: Right;   CATARACT EXTRACTION W/ INTRAOCULAR LENS  IMPLANT, BILATERAL Bilateral    COLONIC STENT PLACEMENT N/A 08/31/2017   Procedure: COLONIC STENT PLACEMENT;  Surgeon: Carol Ada, MD;  Location: [facility];  Service: Endoscopy;  Laterality: N/A;   COLONOSCOPY     2010-2011 Dr.Kipreos    COLOSTOMY  12/2016   Archie Endo 01/26/2017   COLOSTOMY REVERSAL N/A 07/13/2017   Procedure: COLOSTOMY REVERSAL;  Surgeon: Judeth Horn, MD;  Location: [facility];  Service: General;  Laterality: N/A;   CYST REMOVAL HAND Right 05/2013   thumb   DILATION AND CURETTAGE OF UTERUS  X 5-6   ESOPHAGOGASTRODUODENOSCOPY (EGD) WITH PROPOFOL N/A 03/13/2020   Procedure: ESOPHAGOGASTRODUODENOSCOPY (EGD) WITH PROPOFOL;  Surgeon: Carol Ada, MD;  [facility];  Service: Endoscopy;  Laterality: N/A;   EXCISION MASS ABDOMINAL N/A 07/09/2018   Procedure: EXPLORATION OF  ABDOMINAL WOUND ERAS PATHWAY;  Surgeon: Judeth Horn, MD;  Location: [facility] [place];  Service: General;  Laterality: N/A;   FLEXIBLE  SIGMOIDOSCOPY N/A 08/24/2017   Procedure: Beryle Quant;  Surgeon: Carol Ada, MD;  Location: [facility];  Service: Endoscopy;  Laterality: N/A;   FLEXIBLE SIGMOIDOSCOPY N/A 08/31/2017   Procedure: FLEXIBLE SIGMOIDOSCOPY;  Surgeon: Carol Ada, MD;  Location: [place];  Service: Endoscopy;  Laterality: N/A;  stent placement   FLEXIBLE SIGMOIDOSCOPY N/A 03/13/2020   Procedure: FLEXIBLE SIGMOIDOSCOPY;  Surgeon: Carol Ada, MD;  [facility];  Service: Endoscopy;  Laterality: N/A;   FLEXIBLE SIGMOIDOSCOPY N/A 12/18/2020   Procedure: FLEXIBLE SIGMOIDOSCOPY;  Surgeon: Carol Ada, MD;  [facility];  Service: Endoscopy;  Laterality: N/A;   HEMOSTASIS CLIP PLACEMENT  03/13/2020   Procedure: HEMOSTASIS CLIP PLACEMENT;  Surgeon: Carol Ada, MD;  [facility];  Service: Endoscopy;;   HEMOSTASIS CLIP PLACEMENT  12/18/2020   Procedure: HEMOSTASIS CLIP PLACEMENT;  Surgeon: Carol Ada, MD;  [facility];  Service: Endoscopy;;   HOT HEMOSTASIS N/A 03/13/2020   Procedure: HOT HEMOSTASIS (ARGON PLASMA COAGULATION/BICAP);  Surgeon: Carol Ada, MD;  Location[facility];  Service: Endoscopy;  Laterality: N/A;   HOT HEMOSTASIS N/A 12/18/2020   Procedure: HOT HEMOSTASIS (ARGON PLASMA COAGULATION/BICAP);  Surgeon: Carol Ada, MD;  Location[facility];  Service: Endoscopy;  Laterality: N/A;   IMPLANTABLE CARDIOVERTER DEFIBRILLATOR GENERATOR CHANGE  2008   INSERTION OF MESH N/A 07/09/2018   Procedure: INSERTION OF VICRYL MESH;  Surgeon: Judeth Horn, MD;  Location: [facility];  Service: General;  Laterality: N/A;   LAPAROTOMY N/A 09/18/2017   Procedure: EXPLORATORY LAPAROTOMY;  Surgeon: Judeth Horn, MD;  Location: [facility];  Service: General;  Laterality: N/A;   LEAD REVISION  06/22/2018   LEAD REVISION/REPAIR N/A 06/22/2018   Procedure: LEAD REVISION/REPAIR;  Surgeon:  Deboraha Sprang, MD;  Location: [facility];  Service: Cardiovascular;   Laterality: N/A;   LYSIS OF ADHESION N/A 09/18/2017   Procedure: LYSIS OF ADHESION;  Surgeon: Judeth Horn, MD;  Location: [place];  Service: General;  Laterality: N/A;   LYSIS OF ADHESION N/A 07/09/2018   Procedure: LYSIS OF ADHESION;  Surgeon: Judeth Horn, MD;  Location: [facility];  Service: General;  Laterality: N/A;   PARTIAL COLECTOMY N/A 09/18/2017   Procedure: ILEOCOLECTOMY;  Surgeon: Judeth Horn, MD;  Location: [facility];  Service: General;  Laterality: N/A;   PARTIAL COLECTOMY  09/25/2017   POLYPECTOMY  03/13/2020   Procedure: POLYPECTOMY;  Surgeon: Carol Ada, MD;  [facility];  Service: Endoscopy;;   RIGHT/LEFT HEART CATH AND CORONARY ANGIOGRAPHY N/A 06/01/2018   Procedure: RIGHT/LEFT HEART CATH AND CORONARY ANGIOGRAPHY;  Surgeon: Jettie Booze, MD;  Location: [facility];  Service: Cardiovascular;  Laterality: N/A;   SIGMOIDOSCOPY N/A 09/18/2017   Procedure: SIGMOIDOSCOPY;  Surgeon: Judeth Horn, MD;  Location: [facility];  Service: General;  Laterality: N/A;   SMALL INTESTINE SURGERY  07/09/2018   EXPLORATION OF  ABDOMINAL WOUND ERAS PATHWAYN/; MESH; LYSIS OF ADHESIONS   TOTAL ABDOMINAL HYSTERECTOMY  1990   TRANSFORAMINAL LUMBAR INTERBODY FUSION (TLIF) WITH PEDICLE SCREW FIXATION 2 LEVEL N/A 01/07/2020   Procedure: Lumbar Four-Five and Lumbar Five-Sacral One open lumbar decompression and transforaminal lumbar interbody fusion;  Surgeon: Judith Part, MD;  Location: [place];  Service: Neurosurgery;  Laterality: N/A;  Lumbar Four-Five and Lumbar Five-Sacral One open lumbar decompression and transforaminal lumbar interbody fusion   TRIGGER FINGER RELEASE Right 05/213   TRIGGER FINGER RELEASE Right 04/08/2019   Procedure: RIGHT INDEX RELEASE TRIGGER FINGER/A-1 PULLEY;  Surgeon: Milly Jakob, MD;  Location: [place];  Service: Orthopedics;  Laterality: Right;   TUBAL LIGATION  1980s   VESICOVAGINAL FISTULA CLOSURE W/ TAH     Past Medical History:  Diagnosis Date    AICD (automatic cardioverter/defibrillator) present    high RV threshold chronically, device was turned off in 2014; Device battery has been dead x 7 years; "turned it back on 06/22/2018"   Anemia, iron deficiency    Angioedema    felt to likely be due to ace inhibitors but says she has had this even off of medicines, appears to be tolerating ARBs chronically   Anxiety    Arthritis    "hands, legs, arms; bad in my back" (06/22/2018)   Asthmatic bronchitis with status asthmaticus    Bradycardia    CHF (congestive heart failure)    Chronic lower back pain    Chronic pain    CKD (chronic kidney disease), stage III    Coronary artery disease    Mild nonobstructive (30% LAD, 30% RCA) by 09/01/16 cath at [facility]   Degenerative joint disease    Depression    Diabetes mellitus, type 2    Diabetic peripheral neuropathy    Dizziness    Dyspnea on exertion    Family history of adverse reaction to anesthesia    Mother has nausea   GERD (gastroesophageal reflux disease)    Gout    Heart valve disorder    History of blood transfusion 1981; ~ 2005; 12/2016   "childbirth; defibrillator OR; colostomy OR"   History of mononucleosis 03/2014   Hyperlipidemia    Hypertension    Hypothyroid    Insomnia    Intervertebral disc degeneration    Ischemic cardiomyopathy  LBBB (left bundle branch block)    Myocardial infarction dx'd ~ 2005   Nonischemic cardiomyopathy    s/p ICD in 2005. EF has since recovered   Obesity    On home oxygen therapy    "2L at night" (06/22/2018)   OSA on CPAP    Pneumonia    "couple times; last time was 12/2016" (06/22/2018)   PONV (postoperative nausea and vomiting)    Presence of permanent cardiac pacemaker    [employer]   Swelling    Syncope    Systemic hypertension    Vitamin D deficiency    Ht 5\' 4"  (1.626 m)   Wt 245 lb (111.1 kg)   BMI 42.05 kg/m   Opioid Risk Score:   Fall Risk Score:  `1  Depression screen [facility] 2/9     10/26/2022    11:03 AM 09/27/2022   11:03 AM 08/02/2022   11:22 AM 07/19/2022   11:27 AM 06/21/2022   10:01 AM 03/08/2022   10:19 AM 01/26/2022    9:54 AM  Depression screen PH[facility] 2/9  Decreased Interest 0 0 0 0 0 0 0  Down, Depressed, Hopeless 0 0 0 0 0 0 0  PHQ - 2 Score 0 0 0 0 0 0 0      Review of Systems  Musculoskeletal:  Positive for back pain, gait problem and neck pain.       B/L knee pain   All other systems reviewed and are negative.     Objective:   Physical Exam Vitals and nursing note reviewed.  Constitutional:      Appearance: Normal appearance. She is obese.  Cardiovascular:     Rate and Rhythm: Normal rate and regular rhythm.     Pulses: Normal pulses.     Heart sounds: Normal heart sounds.  Pulmonary:     Effort: Pulmonary effort is normal.     Breath sounds: Normal breath sounds.  Musculoskeletal:     Cervical back: Normal range of motion and neck supple.     Comments: Normal Muscle Bulk and Muscle Testing Reveals:  Upper Extremities: Full ROM and Muscle Strength5/5  Lumbar Paraspinal Tenderness: L-4-L-5 Wearing left wrist splint Lower Extremities: Full ROM and Muscle Strength 5/5 Arises from Table slowly using walker for support Narrow Based  Gait     Skin:    General: Skin is warm and dry.  Neurological:     Mental Status: She is alert and oriented to person, place, and time.  Psychiatric:        Mood and Affect: Mood normal.        Behavior: Behavior normal.          Assessment & Plan:  Chronic Low Back Pain: Encouraged to increase HEP as Tolerated. Continue to Monitor. 10/26/2022 2. Bilateral Primary Osteoarthritis:  S/P Right Knee Genicular nerve radiofrequency neurotomy, with Dr Letta Pate. On 08/02/2022 and S/P Left Knee Genicular Nerve Radiofrequency on 08/16/2022. With good relief noted.  : Indication Chronic post operative pain in the Knee, pain postop total knee replacement which has not responded to conservative management such as physical therapy  and medication management; Continue monitor. 10/26/2022 3. Myofascial Pain: S/P Trigger Point Injection on 06/29/2021 with Good Relief Noted. 10/26/2022 4. Chronic Pain Syndrome: Refilled: Oxycodone 7.5mg  /325 mg one tablet three times a day as needed for pain #90, Discontinue Hydrocodone due to itching, Ms. Atkison verbalizes understanding.  We will continue the opioid monitoring program, this consists of regular clinic visits,  examinations, urine drug screen, pill counts as well as use of [place] Controlled Substance Reporting system. A 12 month History has been reviewed on the [place] on 10/26/2022. 5. Polyarthralgia: Continue HEP as tolerated. Continue current medication regimen. Continue to monitor.   F/U in 1 month

## 2022-10-31 DIAGNOSIS — N184 Chronic kidney disease, stage 4 (severe): Secondary | ICD-10-CM | POA: Diagnosis not present

## 2022-11-02 DIAGNOSIS — E1122 Type 2 diabetes mellitus with diabetic chronic kidney disease: Secondary | ICD-10-CM | POA: Diagnosis not present

## 2022-11-02 DIAGNOSIS — N2581 Secondary hyperparathyroidism of renal origin: Secondary | ICD-10-CM | POA: Diagnosis not present

## 2022-11-02 DIAGNOSIS — N184 Chronic kidney disease, stage 4 (severe): Secondary | ICD-10-CM | POA: Diagnosis not present

## 2022-11-02 DIAGNOSIS — I5042 Chronic combined systolic (congestive) and diastolic (congestive) heart failure: Secondary | ICD-10-CM | POA: Diagnosis not present

## 2022-11-02 DIAGNOSIS — I129 Hypertensive chronic kidney disease with stage 1 through stage 4 chronic kidney disease, or unspecified chronic kidney disease: Secondary | ICD-10-CM | POA: Diagnosis not present

## 2022-11-02 DIAGNOSIS — D509 Iron deficiency anemia, unspecified: Secondary | ICD-10-CM | POA: Diagnosis not present

## 2022-11-03 DIAGNOSIS — M25532 Pain in left wrist: Secondary | ICD-10-CM | POA: Diagnosis not present

## 2022-11-03 DIAGNOSIS — M654 Radial styloid tenosynovitis [de Quervain]: Secondary | ICD-10-CM | POA: Diagnosis not present

## 2022-11-09 ENCOUNTER — Ambulatory Visit: Payer: Self-pay

## 2022-11-09 NOTE — Patient Outreach (Signed)
  Care Coordination   11/09/2022 Name: Crystal [name] MRN: [id number] DOB: [birth year]   Care Coordination Outreach Attempts:  An unsuccessful telephone outreach was attempted for a scheduled appointment today.  Follow Up Plan:  Additional outreach attempts will be made to offer the patient care coordination information and services.   Encounter Outcome:  No Answer   Care Coordination Interventions:  No, not indicated    Delsa Sale, RN, BSN, CCM Care Management Coordinator [facility]  Direct Phone: [phone]

## 2022-11-17 NOTE — Progress Notes (Signed)
Office Visit    Patient Name: Crystal [name] Date of Encounter: 11/18/2022  PCP:  Rodrigo Ran, MD   [facility]  Cardiologist:  Lance Muss, MD  Advanced Practice Provider:  No care team member to display Electrophysiologist:  None   HPI    Crystal [name] is a 72 y.o. female with a past medical history of NICM dating back to 2005 with reported LVEF of 24% at that time, BiV AICD, anxiety,  CHF, bradycardia hypertension, hyperlipidemia, previous syncope, myocardial infarction and LBBB presents today for follow-up appointment.  She has a longstanding history of NICM dating back to 2005 with reported LVEF of 24% at that time.  Cardiac cath showed no significant CAD.  She has a chronic LBBB.  Had BiV AICD placed previously with epicardial LV leads with subsequent normalization of LVEF improved with BiV pacing greater than 55%.  Notes device reached ERI and her leads had increasing thresholds around that time.  We decided not to replace the device since normal LVEF and she was followed clinically.  Repeat echocardiogram 04/2014 with LVEF 50 to 55%.  Questionable history of angioedema on ACE inhibitor, she states she has been on ARB and has tolerated well.  She also has CKD.  Had echocardiogram in 2018 in [place] with no CAD and EF 50% per report.  Her other medical problems include colonoscopy for colitis.  It had been resolved.  She may gaining weight since that time.  Gained about 30 pounds.  She had carpal tunnel surgery.  She was followed by Dr. Graciela Husbands as well and had a cardiac catheterization 11/19 which showed proximal LAD 25% stenosis, ostial first diagonal lesion 25% stenosis, mid left main lesion was 10% stenosis, LV end-diastolic pressure mildly elevated.  LVEDP 17 mmHg, no aortic valve stenosis.  Hemodynamic findings consistent with mild pulmonary hypertension.  Echocardiogram 09/2018 showed severely reduced systolic function 25 to 30%.   Moderately dilated.  Mildly increased left ventricular wall thickness.  Left ventricle systolic parameters with impaired relaxation.  Left ventricle with diffuse hypokinesis.  Mild mitral annular calcification present.  Mild dilation of the aortic root which measured 39 mm.  Global severe reduction in LV systolic function, mild diastolic dysfunction, moderate LVE, mild LVH, and mild MR.  Hospitalized with COVID January 2021 and treated with Decadron and remdesivir.  Had back pain and had surgery 5/21 for sciatica that went well.  EF had normalized at that time no cardiac issues with surgery.  She got her COVID vaccines.  Had some anemia in 2021 and received 2 transfusions but improved with IV iron.  Today, she tells me that she has been experiencing some ankle swelling mostly on the left side.  This started about a month ago.  She also has had more labored breathing.  Even with seemingly effortless tasks like drinking water she gets short of breath.  She saw her kidney doctor about a month ago and things have been stable, creatinine 2.2.  She has been taking her torsemide every day this week.  She is also on [place] which used to work well for her.  She has not had an updated echocardiogram since 2021.  She denied any chest pain.  Her anginal equivalent is jaw and neck pain which she has not experienced.  She tells me her shortness of breath has gotten particularly worse over the last week or 2.  She has not had any leg pain but has had chronic knee  pain bilaterally  Reports no chest pain, pressure, or tightness. No orthopnea, PND. Reports no palpitations.    Past Medical History    Past Medical History:  Diagnosis Date   AICD (automatic cardioverter/defibrillator) present    high RV threshold chronically, device was turned off in 2014; Device battery has been dead x 7 years; "turned it back on 06/22/2018"   Anemia, iron deficiency    Angioedema    felt to likely be due to ace inhibitors but says  she has had this even off of medicines, appears to be tolerating ARBs chronically   Anxiety    Arthritis    "hands, legs, arms; bad in my back" (06/22/2018)   Asthmatic bronchitis with status asthmaticus    Bradycardia    CHF (congestive heart failure) (HCC)    Chronic lower back pain    Chronic pain    CKD (chronic kidney disease), stage III (HCC)    Coronary artery disease    Mild nonobstructive (30% LAD, 30% RCA) by 09/01/16 cath at [facility]   Degenerative joint disease    Depression    Diabetes mellitus, type 2 (HCC)    Diabetic peripheral neuropathy (HCC)    Dizziness    Dyspnea on exertion    Family history of adverse reaction to anesthesia    Mother has nausea   GERD (gastroesophageal reflux disease)    Gout    Heart valve disorder    History of blood transfusion 1981; ~ 2005; 12/2016   "childbirth; defibrillator OR; colostomy OR"   History of mononucleosis 03/2014   Hyperlipidemia    Hypertension    Hypothyroid    Insomnia    Intervertebral disc degeneration    Ischemic cardiomyopathy    LBBB (left bundle branch block)    Myocardial infarction (HCC) dx'd ~ 2005   Nonischemic cardiomyopathy (HCC)    s/p ICD in 2005. EF has since recovered   Obesity    On home oxygen therapy    "2L at night" (06/22/2018)   OSA on CPAP    Pneumonia    "couple times; last time was 12/2016" (06/22/2018)   PONV (postoperative nausea and vomiting)    Presence of permanent cardiac pacemaker    [employer]   Swelling    Syncope    Systemic hypertension    Vitamin D deficiency    Past Surgical History:  Procedure Laterality Date   APPENDECTOMY  07/13/2017   BLADDER SUSPENSION  1990   "w/hysterectomy"   BOWEL RESECTION N/A 07/09/2018   Procedure: SMALL BOWEL RESECTION;  Surgeon: Jimmye Norman, MD;  Location: [facility] OR;  Service: General;  Laterality: N/A;   CARDIAC CATHETERIZATION  2005   no obstructive CAD per patient   CARDIAC CATHETERIZATION  2018   CARDIAC  DEFIBRILLATOR PLACEMENT  2005   BiV ICD implanted,  LV lead is an epicardial lead   CARPAL TUNNEL RELEASE Left 07/25/2014   Dr.Williamson    CARPAL TUNNEL RELEASE Right 04/08/2019   Procedure: RIGHT CARPAL TUNNEL RELEASE ENDOSCOPIC;  Surgeon: Mack Hook, MD;  Location: [facility] OR;  Service: Orthopedics;  Laterality: Right;   CATARACT EXTRACTION W/ INTRAOCULAR LENS  IMPLANT, BILATERAL Bilateral    COLONIC STENT PLACEMENT N/A 08/31/2017   Procedure: COLONIC STENT PLACEMENT;  Surgeon: Jeani Hawking, MD;  Location: [facility] ENDOSCOPY;  Service: Endoscopy;  Laterality: N/A;   COLONOSCOPY     2010-2011 Dr.Kipreos    COLOSTOMY  12/2016   Hattie Perch 01/26/2017   COLOSTOMY REVERSAL N/A 07/13/2017  Procedure: COLOSTOMY REVERSAL;  Surgeon: Jimmye Norman, MD;  Location: [facility] OR;  Service: General;  Laterality: N/A;   CYST REMOVAL HAND Right 05/2013   thumb   DILATION AND CURETTAGE OF UTERUS  X 5-6   ESOPHAGOGASTRODUODENOSCOPY (EGD) WITH PROPOFOL N/A 03/13/2020   Procedure: ESOPHAGOGASTRODUODENOSCOPY (EGD) WITH PROPOFOL;  Surgeon: Jeani Hawking, MD;  Location: [facility] ENDOSCOPY;  Service: Endoscopy;  Laterality: N/A;   EXCISION MASS ABDOMINAL N/A 07/09/2018   Procedure: EXPLORATION OF  ABDOMINAL WOUND ERAS PATHWAY;  Surgeon: Jimmye Norman, MD;  Location: [facility] OR;  Service: General;  Laterality: N/A;   FLEXIBLE SIGMOIDOSCOPY N/A 08/24/2017   Procedure: Arnell Sieving;  Surgeon: Jeani Hawking, MD;  Location: [facility] ENDOSCOPY;  Service: Endoscopy;  Laterality: N/A;   FLEXIBLE SIGMOIDOSCOPY N/A 08/31/2017   Procedure: FLEXIBLE SIGMOIDOSCOPY;  Surgeon: Jeani Hawking, MD;  Location: [facility] ENDOSCOPY;  Service: Endoscopy;  Laterality: N/A;  stent placement   FLEXIBLE SIGMOIDOSCOPY N/A 03/13/2020   Procedure: FLEXIBLE SIGMOIDOSCOPY;  Surgeon: Jeani Hawking, MD;  Location: [facility] ENDOSCOPY;  Service: Endoscopy;  Laterality: N/A;   FLEXIBLE SIGMOIDOSCOPY N/A 12/18/2020   Procedure: FLEXIBLE SIGMOIDOSCOPY;  Surgeon: Jeani Hawking, MD;  Location:  [facility] ENDOSCOPY;  Service: Endoscopy;  Laterality: N/A;   HEMOSTASIS CLIP PLACEMENT  03/13/2020   Procedure: HEMOSTASIS CLIP PLACEMENT;  Surgeon: Jeani Hawking, MD;  Location: [facility] ENDOSCOPY;  Service: Endoscopy;;   HEMOSTASIS CLIP PLACEMENT  12/18/2020   Procedure: HEMOSTASIS CLIP PLACEMENT;  Surgeon: Jeani Hawking, MD;  Location: [facility] ENDOSCOPY;  Service: Endoscopy;;   HOT HEMOSTASIS N/A 03/13/2020   Procedure: HOT HEMOSTASIS (ARGON PLASMA COAGULATION/BICAP);  Surgeon: Jeani Hawking, MD;  Location: [facility] ENDOSCOPY;  Service: Endoscopy;  Laterality: N/A;   HOT HEMOSTASIS N/A 12/18/2020   Procedure: HOT HEMOSTASIS (ARGON PLASMA COAGULATION/BICAP);  Surgeon: Jeani Hawking, MD;  Location: [facility] ENDOSCOPY;  Service: Endoscopy;  Laterality: N/A;   IMPLANTABLE CARDIOVERTER DEFIBRILLATOR GENERATOR CHANGE  2008   INSERTION OF MESH N/A 07/09/2018   Procedure: INSERTION OF VICRYL MESH;  Surgeon: Jimmye Norman, MD;  Location: [facility] OR;  Service: General;  Laterality: N/A;   LAPAROTOMY N/A 09/18/2017   Procedure: EXPLORATORY LAPAROTOMY;  Surgeon: Jimmye Norman, MD;  Location: [facility] OR;  Service: General;  Laterality: N/A;   LEAD REVISION  06/22/2018   LEAD REVISION/REPAIR N/A 06/22/2018   Procedure: LEAD REVISION/REPAIR;  Surgeon: Duke Salvia, MD;  Location: [facility] INVASIVE CV LAB;  Service: Cardiovascular;  Laterality: N/A;   LYSIS OF ADHESION N/A 09/18/2017   Procedure: LYSIS OF ADHESION;  Surgeon: Jimmye Norman, MD;  Location: [facility] OR;  Service: General;  Laterality: N/A;   LYSIS OF ADHESION N/A 07/09/2018   Procedure: LYSIS OF ADHESION;  Surgeon: Jimmye Norman, MD;  Location: [facility] OR;  Service: General;  Laterality: N/A;   PARTIAL COLECTOMY N/A 09/18/2017   Procedure: ILEOCOLECTOMY;  Surgeon: Jimmye Norman, MD;  Location: [facility] OR;  Service: General;  Laterality: N/A;   PARTIAL COLECTOMY  09/25/2017   POLYPECTOMY  03/13/2020   Procedure: POLYPECTOMY;  Surgeon: Jeani Hawking, MD;  Location: [facility] ENDOSCOPY;  Service: Endoscopy;;    RIGHT/LEFT HEART CATH AND CORONARY ANGIOGRAPHY N/A 06/01/2018   Procedure: RIGHT/LEFT HEART CATH AND CORONARY ANGIOGRAPHY;  Surgeon: Corky Crafts, MD;  Location: [facility] INVASIVE CV LAB;  Service: Cardiovascular;  Laterality: N/A;   SIGMOIDOSCOPY N/A 09/18/2017   Procedure: SIGMOIDOSCOPY;  Surgeon: Jimmye Norman, MD;  Location: [facility] OR;  Service: General;  Laterality: N/A;   SMALL INTESTINE SURGERY  07/09/2018   EXPLORATION OF  ABDOMINAL WOUND ERAS PATHWAYN/;  MESH; LYSIS OF ADHESIONS   TOTAL ABDOMINAL HYSTERECTOMY  1990   TRANSFORAMINAL LUMBAR INTERBODY FUSION (TLIF) WITH PEDICLE SCREW FIXATION 2 LEVEL N/A 01/07/2020   Procedure: Lumbar Four-Five and Lumbar Five-Sacral One open lumbar decompression and transforaminal lumbar interbody fusion;  Surgeon: Jadene Pierini, MD;  Location: [facility] OR;  Service: Neurosurgery;  Laterality: N/A;  Lumbar Four-Five and Lumbar Five-Sacral One open lumbar decompression and transforaminal lumbar interbody fusion   TRIGGER FINGER RELEASE Right 05/213   TRIGGER FINGER RELEASE Right 04/08/2019   Procedure: RIGHT INDEX RELEASE TRIGGER FINGER/A-1 PULLEY;  Surgeon: Mack Hook, MD;  Location: [facility] OR;  Service: Orthopedics;  Laterality: Right;   TUBAL LIGATION  1980s   VESICOVAGINAL FISTULA CLOSURE W/ TAH      Allergies  Allergies  Allergen Reactions   Ace Inhibitors Swelling and Other (See Comments)    Angioedema Other reaction(s): swelling Other reaction(s): swelling Other reaction(s): swelling Other reaction(s): Unknown   Glucophage [Metformin Hcl] Other (See Comments)    Renal failure   Telmisartan Other (See Comments)    AVOID ARB/ ACEi in this patient due to recurrent AKI   Advicor [Niacin-Lovastatin Er] Other (See Comments)    Muscle aches   Atorvastatin Other (See Comments)    Muscle aches   Bystolic [Nebivolol Hcl] Swelling    Whole body swelled   Erythromycin Diarrhea, Nausea And Vomiting and Other (See Comments)    *DERIVATIVES*  Other  reaction(s): severe vomiting  Other reaction(s): Unknown   Lopid [Gemfibrozil] Other (See Comments)    Muscle aches   Statins Other (See Comments)    Muscle aches tolerates crestor Other reaction(s): Unknown Other reaction(s): muscle aches   Erythromycin Base Nausea And Vomiting   Hydromorphone Other (See Comments)    Confusion Other reaction(s): confusion.can take lower level narcs but those cause a lot of itching Other reaction(s): confusion.can take lower level narcs but those cause a lot of itching   Lisinopril    Losartan     Other reaction(s): stopped 12/2018. ? due to worse gfr Other reaction(s): stopped 12/2018. ? due to worse gfr Other reaction(s): stopped 12/2018. ? due to worse gfr Other reaction(s): stopped 12/2018. ? due to worse gfr   Nebivolol Hcl Swelling    Other reaction(s): swelling  Other reaction(s): Unknown  Other Reaction(s): Not available   Other     Other reaction(s): muscle aches   Sacubitril-Valsartan     Other reaction(s): itching Other reaction(s): itching Other reaction(s): itching Other reaction(s): itching   Spironolactone     Other reaction(s): arf Other reaction(s): arf Other reaction(s): arf Other reaction(s): arf    EKGs/Labs/Other Studies Reviewed:   The following studies were reviewed today: Cardiac Studies & Procedures   CARDIAC CATHETERIZATION  CARDIAC CATHETERIZATION 06/01/2018  Narrative  Prox LAD lesion is 25% stenosed.  Ost 1st Diag lesion is 25% stenosed.  Mid LM lesion is 10% stenosed.  LV end diastolic pressure is mildly elevated. LVEDP 17 mm Hg.  There is no aortic valve stenosis.  Hemodynamic findings consistent with mild pulmonary hypertension.  Ao sat 98%, PA sat 66%, CO 4.79 L/min; CI 2.2, mean PA 30 mm Hg; mean PCWP 22 mm Hg  No significant CAD.  Medical therapy for LV dysfunction.  Findings Coronary Findings Diagnostic  Dominance: Right  Left Main Mid LM lesion is 10% stenosed.  Left Anterior  Descending Prox LAD lesion is 25% stenosed.  First Diagonal Branch Ost 1st Diag lesion is 25% stenosed.  Intervention  No interventions  have been documented.   CARDIAC CATHETERIZATION  CARDIAC CATHETERIZATION 05/31/2018     ECHOCARDIOGRAM  ECHOCARDIOGRAM COMPLETE 12/13/2019  Narrative ECHOCARDIOGRAM REPORT    Patient Name:   SOLITA MACADAM Date of Exam: 12/13/2019 Medical Rec #:  [id number]      Height:       65.0 in Accession #:    [id number]     Weight:       245.0 lb Date of Birth:  10/11/1950       BSA:          2.156 m Patient Age:    69 years       BP:           110/56 mmHg Patient Gender: F              HR:           85 bpm. Exam Location:  [address]  Procedure: 2D Echo, 3D Echo, Cardiac Doppler, Color Doppler and Strain Analysis  Indications:    I42.8 Nonischemic cardiomyopathy  History:        Patient has prior history of Echocardiogram examinations, most recent 10/01/2018. Defibrillator; Risk Factors:Hypertension, Diabetes and Dyslipidemia. Syncope. Obstructive sleep apnea-CPAP. Pneumonia. Dyspnea. LBBB. Congestive heart failure. Chronic kidney disease. Coronary artery disease.  Sonographer:    Sedonia Small Rodgers-Jones RDCS Referring Phys: [id number] JAYADEEP S VARANASI  IMPRESSIONS   1. Normal LV systolic function; grade 1 diastolic dysfunction; moderate LVH; mild RVE. 2. Left ventricular ejection fraction, by estimation, is 55 to 60%. The left ventricle has normal function. The left ventricle has no regional wall motion abnormalities. There is moderate left ventricular hypertrophy. Left ventricular diastolic parameters are consistent with Grade I diastolic dysfunction (impaired relaxation). 3. Right ventricular systolic function is normal. The right ventricular size is mildly enlarged. There is normal pulmonary artery systolic pressure. 4. The mitral valve is normal in structure. Trivial mitral valve regurgitation. No evidence of mitral stenosis. 5. The aortic  valve is tricuspid. Aortic valve regurgitation is not visualized. Mild aortic valve sclerosis is present, with no evidence of aortic valve stenosis. 6. The inferior vena cava is normal in size with greater than 50% respiratory variability, suggesting right atrial pressure of 3 mmHg.  FINDINGS Left Ventricle: Left ventricular ejection fraction, by estimation, is 55 to 60%. The left ventricle has normal function. The left ventricle has no regional wall motion abnormalities. The left ventricular internal cavity size was normal in size. There is moderate left ventricular hypertrophy. Left ventricular diastolic parameters are consistent with Grade I diastolic dysfunction (impaired relaxation).  Right Ventricle: The right ventricular size is mildly enlarged. Right ventricular systolic function is normal. There is normal pulmonary artery systolic pressure. The tricuspid regurgitant velocity is 2.27 m/s, and with an assumed right atrial pressure of 3 mmHg, the estimated right ventricular systolic pressure is 23.6 mmHg.  Left Atrium: Left atrial size was normal in size.  Right Atrium: Right atrial size was normal in size.  Pericardium: There is no evidence of pericardial effusion.  Mitral Valve: The mitral valve is normal in structure. Normal mobility of the mitral valve leaflets. Mild mitral annular calcification. Trivial mitral valve regurgitation. No evidence of mitral valve stenosis.  Tricuspid Valve: The tricuspid valve is normal in structure. Tricuspid valve regurgitation is mild . No evidence of tricuspid stenosis.  Aortic Valve: The aortic valve is tricuspid. Aortic valve regurgitation is not visualized. Mild aortic valve sclerosis is present, with no evidence of aortic valve stenosis.  Pulmonic  Valve: The pulmonic valve was not well visualized. Pulmonic valve regurgitation is not visualized. No evidence of pulmonic stenosis.  Aorta: The aortic root is normal in size and structure.  Venous:  The inferior vena cava is normal in size with greater than 50% respiratory variability, suggesting right atrial pressure of 3 mmHg.  IAS/Shunts: No atrial level shunt detected by color flow Doppler.  Additional Comments: Normal LV systolic function; grade 1 diastolic dysfunction; moderate LVH; mild RVE. A pacer wire is visualized.   LEFT VENTRICLE PLAX 2D LVIDd:         4.80 cm  Diastology LVIDs:         3.40 cm  LV e' lateral:   9.57 cm/s LV PW:         1.50 cm  LV E/e' lateral: 7.1 LV IVS:        1.50 cm  LV e' medial:    7.72 cm/s LVOT diam:     2.10 cm  LV E/e' medial:  8.8 LV SV:         79 LV SV Index:   37       2D Longitudinal Strain LVOT Area:     3.46 cm 2D Strain GLS (A2C):   -16.4 % 2D Strain GLS (A3C):   -21.3 % 2D Strain GLS (A4C):   -17.1 % 2D Strain GLS Avg:     -18.3 %  3D Volume EF: 3D EF:        57 % LV EDV:       254 ml LV ESV:       109 ml LV SV:        145 ml  RIGHT VENTRICLE RV Basal diam:  4.20 cm RV S prime:     12.20 cm/s TAPSE (M-mode): 1.9 cm  LEFT ATRIUM             Index       RIGHT ATRIUM           Index LA diam:        4.90 cm 2.27 cm/m  RA Area:     14.40 cm LA Vol (A2C):   88.8 ml 41.18 ml/m RA Volume:   38.00 ml  17.62 ml/m LA Vol (A4C):   47.3 ml 21.94 ml/m LA Biplane Vol: 68.0 ml 31.54 ml/m AORTIC VALVE LVOT Vmax:   101.70 cm/s LVOT Vmean:  76.450 cm/s LVOT VTI:    0.228 m  AORTA Ao Root diam: 3.60 cm Ao Asc diam:  3.60 cm  MITRAL VALVE               TRICUSPID VALVE MV Area (PHT): 3.03 cm    TR Peak grad:   20.6 mmHg MV Decel Time: 250 msec    TR Vmax:        227.00 cm/s MV E velocity: 67.70 cm/s MV A velocity: 77.10 cm/s  SHUNTS MV E/A ratio:  0.88        Systemic VTI:  0.23 m Systemic Diam: 2.10 cm  Olga Millers MD Electronically signed by Olga Millers MD Signature Date/Time: 12/13/2019/12:41:23 PM    Final              EKG:  EKG is not ordered today.    Recent Labs: No results found for requested  labs within last 365 days.  Recent Lipid Panel    Component Value Date/Time   CHOL 186 11/22/2017 1507   TRIG 279 (H) 11/22/2017 1507   HDL  48 (L) 11/22/2017 1507   CHOLHDL 3.9 11/22/2017 1507   LDLCALC 98 11/22/2017 1507    Home Medications   Current Meds  Medication Sig   Accu-Chek FastClix Lancets MISC Apply topically 3 (three) times daily.   ACCU-CHEK GUIDE test strip USE STRIPS AS DIRECTED WITH METER THREE TIMES DAILY   acetaminophen-codeine (TYLENOL #3) 300-30 MG tablet    albuterol (PROVENTIL HFA;VENTOLIN HFA) 108 (90 Base) MCG/ACT inhaler Inhale 2 puffs into the lungs every 6 (six) hours as needed for wheezing or shortness of breath.   albuterol (PROVENTIL) (2.5 MG/3ML) 0.083% nebulizer solution 3 mL as needed Inhalation every 6 hrs for 30 days   allopurinol (ZYLOPRIM) 100 MG tablet Take by mouth.   aspirin 81 MG chewable tablet Chew by mouth.   Blood Glucose Monitoring Suppl (ACCU-CHEK GUIDE) w/Device KIT USE AS DIRECTED TO CHECK BLOOD SUGAR 3 TIMES DAILY   bumetanide (BUMEX) 1 MG tablet    carvedilol (COREG) 25 MG tablet Take 25 mg by mouth 2 (two) times daily.    carvedilol (COREG) 25 MG tablet Take by mouth.   Cholecalciferol 50 MCG (2000 UT) TABS 1 capsule Orally Once a day   Continuous Blood Gluc Sensor (FREESTYLE LIBRE 2 SENSOR) MISC See admin instructions.   dapagliflozin propanediol (FARXIGA) 10 MG TABS tablet Take 10 mg by mouth daily.   DULoxetine (CYMBALTA) 60 MG capsule Take 60 mg by mouth daily.   febuxostat (ULORIC) 40 MG tablet Take 40 mg by mouth at bedtime.   gabapentin (NEURONTIN) 300 MG capsule Take 300 mg by mouth at bedtime.   hydrALAZINE (APRESOLINE) 25 MG tablet Take 1 tablet (25 mg total) by mouth in the morning and at bedtime.   INSULIN ASPART Xenia    insulin regular human CONCENTRATED (HUMULIN R U-500 KWIKPEN) 500 UNIT/ML kwikpen Inject 130 Units into the skin 3 (three) times daily with meals.   levothyroxine (SYNTHROID, LEVOTHROID) 88 MCG tablet  Take 88 mcg by mouth daily before breakfast.    loratadine (CLARITIN) 10 MG tablet Take 10 mg by mouth daily.   metolazone (ZAROXOLYN) 2.5 MG tablet Take 1 tablet by mouth daily for 3 days   montelukast (SINGULAIR) 10 MG tablet Take by mouth.   Omega-3 Fatty Acids (OMEGA 3 PO) Take 1 mg by mouth in the morning and at bedtime.   oxyCODONE-acetaminophen (PERCOCET) 7.5-325 MG tablet Take 1 tablet by mouth every 8 (eight) hours as needed.   pantoprazole (PROTONIX) 40 MG tablet Take 40 mg by mouth 2 (two) times daily.    PRESCRIPTION MEDICATION Inhale into the lungs at bedtime. CPAP with  2 liter oxygen   PROCTO-MED HC 2.5 % rectal cream Apply topically.   rosuvastatin (CRESTOR) 5 MG tablet 2 tablet Orally Once a day   temazepam (RESTORIL) 15 MG capsule Take by mouth.   torsemide (DEMADEX) 20 MG tablet Take 20 mg by mouth as needed.     Review of Systems      All other systems reviewed and are otherwise negative except as noted above.  Physical Exam    VS:  BP (!) 116/54   Pulse 75   Ht 5\' 4"  (1.626 m)   Wt 250 lb (113.4 kg)   SpO2 95%   BMI 42.91 kg/m  , BMI Body mass index is 42.91 kg/m.  Wt Readings from Last 3 Encounters:  11/18/22 250 lb (113.4 kg)  10/26/22 245 lb (111.1 kg)  09/27/22 250 lb (113.4 kg)     GEN: Well  nourished, well developed, in no acute distress. HEENT: normal. Neck: Supple, no JVD, carotid bruits, or masses. Cardiac: RRR, no murmurs, rubs, or gallops. No clubbing, cyanosis, Left sided 1+ edema, none on right LE.  Radials/PT 2+ and equal bilaterally.  Respiratory:  Respirations regular and unlabored, clear to auscultation bilaterally. GI: Soft, nontender, nondistended. MS: No deformity or atrophy. Skin: Warm and dry, no rash. Neuro:  Strength and sensation are intact. Psych: Normal affect.  Assessment & Plan    NICM -She is having some unilateral edema today-will check an [place] to r/o DVT -She is also having increased SOB so will update an echo -She  has been taking daily Demadex for the last week and is on Farxiga 10 mg daily -No chest pain and no anginal equivalent pain ( jaw and neck pain) -Will add Zaroxolyn 2.5 mg to take with her Demadex for the next 3 days -Will plan for follow-up BMP next week  CKD stage III -She saw her kidney doctor about a month ago and things have been stable. -She tells me her creatinine was around 2.2 at that time -I explained that her lower extremity edema could be coming from her kidneys versus her heart -She has follow-up scheduled with her kidney doctor in July  Hyperlipidemia -Last LDL was 51 (01/11/2022) -She will need an updated lipid panel next time she is here if not already done by primary -For now, continue Crestor 5 mg daily  Diabetes -Last A1c was 8.5 (07/2022) -Continue to work with primary for better control  Low-grade GI bleed in the past -no recent bleeding    Disposition: Follow up 4 weeks with Lance Muss, MD or APP.  Signed, Sharlene Dory, PA-C 11/18/2022, 8:41 AM [facility]

## 2022-11-18 ENCOUNTER — Ambulatory Visit: Payer: Medicare Other | Attending: Physician Assistant | Admitting: Physician Assistant

## 2022-11-18 ENCOUNTER — Encounter: Payer: Self-pay | Admitting: Physician Assistant

## 2022-11-18 VITALS — BP 116/54 | HR 75 | Ht 64.0 in | Wt 250.0 lb

## 2022-11-18 DIAGNOSIS — E782 Mixed hyperlipidemia: Secondary | ICD-10-CM | POA: Diagnosis not present

## 2022-11-18 DIAGNOSIS — R0602 Shortness of breath: Secondary | ICD-10-CM | POA: Insufficient documentation

## 2022-11-18 DIAGNOSIS — R0989 Other specified symptoms and signs involving the circulatory and respiratory systems: Secondary | ICD-10-CM | POA: Diagnosis not present

## 2022-11-18 DIAGNOSIS — N1832 Chronic kidney disease, stage 3b: Secondary | ICD-10-CM | POA: Insufficient documentation

## 2022-11-18 DIAGNOSIS — I447 Left bundle-branch block, unspecified: Secondary | ICD-10-CM | POA: Insufficient documentation

## 2022-11-18 DIAGNOSIS — I428 Other cardiomyopathies: Secondary | ICD-10-CM | POA: Insufficient documentation

## 2022-11-18 DIAGNOSIS — Z794 Long term (current) use of insulin: Secondary | ICD-10-CM | POA: Insufficient documentation

## 2022-11-18 DIAGNOSIS — E1165 Type 2 diabetes mellitus with hyperglycemia: Secondary | ICD-10-CM | POA: Insufficient documentation

## 2022-11-18 MED ORDER — METOLAZONE 2.5 MG PO TABS: ORAL_TABLET | ORAL | 0 refills | Status: DC

## 2022-11-18 NOTE — Patient Instructions (Signed)
Medication Instructions:  1.Start metolazone 2.5 mg daily for 3 days *If you need a refill on your cardiac medications before your next appointment, please call your pharmacy*  Lab Work: BMET in 1 week If you have labs (blood work) drawn today and your tests are completely normal, you will receive your results only by: MyChart Message (if you have MyChart) OR A paper copy in the mail If you have any lab test that is abnormal or we need to change your treatment, we will call you to review the results.  Testing/Procedures: Your physician has requested that you have an echocardiogram. Echocardiography is a painless test that uses sound waves to create images of your heart. It provides your doctor with information about the size and shape of your heart and how well your heart's chambers and valves are working. This procedure takes approximately one hour. There are no restrictions for this procedure. Please do NOT wear cologne, perfume, aftershave, or lotions (deodorant is allowed). Please arrive 15 minutes prior to your appointment time.   Your physician has requested that you have a lower or upper extremity venous duplex. This test is an ultrasound of the veins in the legs or arms. It looks at venous blood flow that carries blood from the heart to the legs or arms. Allow one hour for a Lower Venous exam. Allow thirty minutes for an Upper Venous exam. There are no restrictions or special instructions.  Follow-Up: At [facility], you and your health needs are our priority.  As part of our continuing mission to provide you with exceptional heart care, we have created designated Provider Care Teams.  These Care Teams include your primary Cardiologist (physician) and Advanced Practice Providers (APPs -  Physician Assistants and Nurse Practitioners) who all work together to provide you with the care you need, when you need it.  Your next appointment:   4 week(s)  Provider:   Lance Muss, MD  or Jari Favre, PA-C        Other Instructions Weigh every morning after using the restroom, before breakfast and let us know if you have a weight gain of 2 lbs or more overnight or 5 lbs or more in a week  Low-Sodium Eating Plan Sodium, which is an element that makes up salt, helps you maintain a healthy balance of fluids in your body. Too much sodium can increase your blood pressure and cause fluid and waste to be held in your body. Your health care provider or dietitian may recommend following this plan if you have high blood pressure (hypertension), kidney disease, liver disease, or heart failure. Eating less sodium can help lower your blood pressure, reduce swelling, and protect your heart, liver, and kidneys. What are tips for following this plan? Reading food labels The Nutrition Facts label lists the amount of sodium in one serving of the food. If you eat more than one serving, you must multiply the listed amount of sodium by the number of servings. Choose foods with less than 140 mg of sodium per serving. Avoid foods with 300 mg of sodium or more per serving. Shopping  Look for lower-sodium products, often labeled as "low-sodium" or "no salt added." Always check the sodium content, even if foods are labeled as "unsalted" or "no salt added." Buy fresh foods. Avoid canned foods and pre-made or frozen meals. Avoid canned, cured, or processed meats. Buy breads that have less than 80 mg of sodium per slice. Cooking  Eat more home-cooked food and  less restaurant, buffet, and fast food. Avoid adding salt when cooking. Use salt-free seasonings or herbs instead of table salt or sea salt. Check with your health care provider or pharmacist before using salt substitutes. Cook with plant-based oils, such as canola, sunflower, or olive oil. Meal planning When eating at a restaurant, ask that your food be prepared with less salt or no salt, if possible. Avoid dishes labeled as  brined, pickled, cured, smoked, or made with soy sauce, miso, or teriyaki sauce. Avoid foods that contain MSG (monosodium glutamate). MSG is sometimes added to [place] food, bouillon, and some canned foods. Make meals that can be grilled, baked, poached, roasted, or steamed. These are generally made with less sodium. General information Most people on this plan should limit their sodium intake to 1,500-2,000 mg (milligrams) of sodium each day. What foods should I eat? Fruits Fresh, frozen, or canned fruit. Fruit juice. Vegetables Fresh or frozen vegetables. "No salt added" canned vegetables. "No salt added" tomato sauce and paste. Low-sodium or reduced-sodium tomato and vegetable juice. Grains Low-sodium cereals, including oats, puffed wheat and rice, and shredded wheat. Low-sodium crackers. Unsalted rice. Unsalted pasta. Low-sodium bread. Whole-grain breads and whole-grain pasta. Meats and other proteins Fresh or frozen (no salt added) meat, poultry, seafood, and fish. Low-sodium canned tuna and salmon. Unsalted nuts. Dried peas, beans, and lentils without added salt. Unsalted canned beans. Eggs. Unsalted nut butters. Dairy Milk. Soy milk. Cheese that is naturally low in sodium, such as ricotta cheese, fresh mozzarella, or Swiss cheese. Low-sodium or reduced-sodium cheese. Cream cheese. Yogurt. Seasonings and condiments Fresh and dried herbs and spices. Salt-free seasonings. Low-sodium mustard and ketchup. Sodium-free salad dressing. Sodium-free light mayonnaise. Fresh or refrigerated horseradish. Lemon juice. Vinegar. Other foods Homemade, reduced-sodium, or low-sodium soups. Unsalted popcorn and pretzels. Low-salt or salt-free chips. The items listed above may not be a complete list of foods and beverages you can eat. Contact a dietitian for more information. What foods should I avoid? Vegetables Sauerkraut, pickled vegetables, and relishes. Olives. [place] fries. Onion rings. Regular canned  vegetables (not low-sodium or reduced-sodium). Regular canned tomato sauce and paste (not low-sodium or reduced-sodium). Regular tomato and vegetable juice (not low-sodium or reduced-sodium). Frozen vegetables in sauces. Grains Instant hot cereals. Bread stuffing, pancake, and biscuit mixes. Croutons. Seasoned rice or pasta mixes. Noodle soup cups. Boxed or frozen macaroni and cheese. Regular salted crackers. Self-rising flour. Meats and other proteins Meat or fish that is salted, canned, smoked, spiced, or pickled. Precooked or cured meat, such as sausages or meat loaves. [name]. Ham. Pepperoni. Hot dogs. Corned beef. Chipped beef. Salt pork. Jerky. Pickled herring. Anchovies and sardines. Regular canned tuna. Salted nuts. Dairy Processed cheese and cheese spreads. Hard cheeses. Cheese curds. Blue cheese. Feta cheese. String cheese. Regular cottage cheese. Buttermilk. Canned milk. Fats and oils Salted butter. Regular margarine. Ghee. Bacon fat. Seasonings and condiments Onion salt, garlic salt, seasoned salt, table salt, and sea salt. Canned and packaged gravies. Worcestershire sauce. Tartar sauce. Barbecue sauce. Teriyaki sauce. Soy sauce, including reduced-sodium. Steak sauce. Fish sauce. Oyster sauce. Cocktail sauce. Horseradish that you find on the shelf. Regular ketchup and mustard. Meat flavorings and tenderizers. Bouillon cubes. Hot sauce. Pre-made or packaged marinades. Pre-made or packaged taco seasonings. Relishes. Regular salad dressings. Salsa. Other foods Salted popcorn and pretzels. Corn chips and puffs. Potato and tortilla chips. Canned or dried soups. Pizza. Frozen entrees and pot pies. The items listed above may not be a complete list of foods and beverages you  should avoid. Contact a dietitian for more information. Summary Eating less sodium can help lower your blood pressure, reduce swelling, and protect your heart, liver, and kidneys. Most people on this plan should limit their  sodium intake to 1,500-2,000 mg (milligrams) of sodium each day. Canned, boxed, and frozen foods are high in sodium. Restaurant foods, fast foods, and pizza are also very high in sodium. You also get sodium by adding salt to food. Try to cook at home, eat more fresh fruits and vegetables, and eat less fast food and canned, processed, or prepared foods. This information is not intended to replace advice given to you by your health care provider. Make sure you discuss any questions you have with your health care provider. Document Revised: 06/17/2019 Document Reviewed: 06/12/2019 Elsevier Patient Education  2023 [employer]   Heart-Healthy Eating Plan Many factors influence your heart health, including eating and exercise habits. Heart health is also called coronary health. Coronary risk increases with abnormal blood fat (lipid) levels. A heart-healthy eating plan includes limiting unhealthy fats, increasing healthy fats, limiting salt (sodium) intake, and making other diet and lifestyle changes. What is my plan? Your health care provider may recommend that: You limit your fat intake to _________% or less of your total calories each day. You limit your saturated fat intake to _________% or less of your total calories each day. You limit the amount of cholesterol in your diet to less than _________ mg per day. You limit the amount of sodium in your diet to less than _________ mg per day. What are tips for following this plan? Cooking Cook foods using methods other than frying. Baking, boiling, grilling, and broiling are all good options. Other ways to reduce fat include: Removing the skin from poultry. Removing all visible fats from meats. Steaming vegetables in water or broth. Meal planning  At meals, imagine dividing your plate into fourths: Fill one-half of your plate with vegetables and green salads. Fill one-fourth of your plate with whole grains. Fill one-fourth of your plate with  lean protein foods. Eat 2-4 cups of vegetables per day. One cup of vegetables equals 1 cup (91 g) broccoli or cauliflower florets, 2 medium carrots, 1 large bell pepper, 1 large sweet potato, 1 large tomato, 1 medium white potato, 2 cups (150 g) raw leafy greens. Eat 1-2 cups of fruit per day. One cup of fruit equals 1 small apple, 1 large banana, 1 cup (237 g) mixed fruit, 1 large orange,  cup (82 g) dried fruit, 1 cup (240 mL) 100% fruit juice. Eat more foods that contain soluble fiber. Examples include apples, broccoli, carrots, beans, peas, and barley. Aim to get 25-30 g of fiber per day. Increase your consumption of legumes, nuts, and seeds to 4-5 servings per week. One serving of dried beans or legumes equals  cup (90 g) cooked, 1 serving of nuts is  oz (12 almonds, 24 pistachios, or 7 walnut halves), and 1 serving of seeds equals  oz (8 g). Fats Choose healthy fats more often. Choose monounsaturated and polyunsaturated fats, such as olive and canola oils, avocado oil, flaxseeds, walnuts, almonds, and seeds. Eat more omega-3 fats. Choose salmon, mackerel, sardines, tuna, flaxseed oil, and ground flaxseeds. Aim to eat fish at least 2 times each week. Check food labels carefully to identify foods with trans fats or high amounts of saturated fat. Limit saturated fats. These are found in animal products, such as meats, butter, and cream. Plant sources of saturated fats include  palm oil, palm kernel oil, and coconut oil. Avoid foods with partially hydrogenated oils in them. These contain trans fats. Examples are stick margarine, some tub margarines, cookies, crackers, and other baked goods. Avoid fried foods. General information Eat more home-cooked food and less restaurant, buffet, and fast food. Limit or avoid alcohol. Limit foods that are high in added sugar and simple starches such as foods made using white refined flour (white breads, pastries, sweets). Lose weight if you are overweight.  Losing just 5-10% of your body weight can help your overall health and prevent diseases such as diabetes and heart disease. Monitor your sodium intake, especially if you have high blood pressure. Talk with your health care provider about your sodium intake. Try to incorporate more vegetarian meals weekly. What foods should I eat? Fruits All fresh, canned (in natural juice), or frozen fruits. Vegetables Fresh or frozen vegetables (raw, steamed, roasted, or grilled). Green salads. Grains Most grains. Choose whole wheat and whole grains most of the time. Rice and pasta, including brown rice and pastas made with whole wheat. Meats and other proteins Lean, well-trimmed beef, veal, pork, and lamb. Chicken and [place] without skin. All fish and shellfish. Wild duck, rabbit, pheasant, and venison. Egg whites or low-cholesterol egg substitutes. Dried beans, peas, lentils, and tofu. Seeds and most nuts. Dairy Low-fat or nonfat cheeses, including ricotta and mozzarella. Skim or 1% milk (liquid, powdered, or evaporated). Buttermilk made with low-fat milk. Nonfat or low-fat yogurt. Fats and oils Non-hydrogenated (trans-free) margarines. Vegetable oils, including soybean, sesame, sunflower, olive, avocado, peanut, safflower, corn, canola, and cottonseed. Salad dressings or mayonnaise made with a vegetable oil. Beverages Water (mineral or sparkling). Coffee and tea. Unsweetened ice tea. Diet beverages. Sweets and desserts Sherbet, gelatin, and fruit ice. Small amounts of dark chocolate. Limit all sweets and desserts. Seasonings and condiments All seasonings and condiments. The items listed above may not be a complete list of foods and beverages you can eat. Contact a dietitian for more options. What foods should I avoid? Fruits Canned fruit in heavy syrup. Fruit in cream or butter sauce. Fried fruit. Limit coconut. Vegetables Vegetables cooked in cheese, cream, or butter sauce. Fried  vegetables. Grains Breads made with saturated or trans fats, oils, or whole milk. Croissants. Sweet rolls. Donuts. High-fat crackers, such as cheese crackers and chips. Meats and other proteins Fatty meats, such as hot dogs, ribs, sausage, bacon, rib-eye roast or steak. High-fat deli meats, such as salami and bologna. Caviar. Domestic duck and goose. Organ meats, such as liver. Dairy Cream, sour cream, cream cheese, and creamed cottage cheese. Whole-milk cheeses. Whole or 2% milk (liquid, evaporated, or condensed). Whole buttermilk. Cream sauce or high-fat cheese sauce. Whole-milk yogurt. Fats and oils Meat fat, or shortening. Cocoa butter, hydrogenated oils, palm oil, coconut oil, palm kernel oil. Solid fats and shortenings, including bacon fat, salt pork, lard, and butter. Nondairy cream substitutes. Salad dressings with cheese or sour cream. Beverages Regular sodas and any drinks with added sugar. Sweets and desserts Frosting. Pudding. Cookies. Cakes. Pies. Milk chocolate or white chocolate. Buttered syrups. Full-fat ice cream or ice cream drinks. The items listed above may not be a complete list of foods and beverages to avoid. Contact a dietitian for more information. Summary Heart-healthy meal planning includes limiting unhealthy fats, increasing healthy fats, limiting salt (sodium) intake and making other diet and lifestyle changes. Lose weight if you are overweight. Losing just 5-10% of your body weight can help your overall health and prevent diseases  such as diabetes and heart disease. Focus on eating a balance of foods, including fruits and vegetables, low-fat or nonfat dairy, lean protein, nuts and legumes, whole grains, and heart-healthy oils and fats. This information is not intended to replace advice given to you by your health care provider. Make sure you discuss any questions you have with your health care provider. Document Revised: 08/16/2021 Document Reviewed:  08/16/2021 Elsevier Patient Education  2023 [employer].

## 2022-11-21 ENCOUNTER — Ambulatory Visit (HOSPITAL_COMMUNITY)
Admission: RE | Admit: 2022-11-21 | Discharge: 2022-11-21 | Disposition: A | Discharge status: Home / Self Care | Payer: Medicare Other | Source: Ambulatory Visit | Attending: Physician Assistant | Admitting: Physician Assistant

## 2022-11-21 DIAGNOSIS — R0989 Other specified symptoms and signs involving the circulatory and respiratory systems: Secondary | ICD-10-CM | POA: Diagnosis not present

## 2022-11-22 ENCOUNTER — Ambulatory Visit (HOSPITAL_COMMUNITY): Payer: Medicare Other | Attending: Internal Medicine

## 2022-11-22 DIAGNOSIS — R0602 Shortness of breath: Secondary | ICD-10-CM | POA: Insufficient documentation

## 2022-11-22 LAB — ECHOCARDIOGRAM COMPLETE
Area-P 1/2: 3.74 cm2
S' Lateral: 3.9 cm

## 2022-11-24 ENCOUNTER — Encounter: Payer: Medicare Other | Attending: Registered Nurse | Admitting: Registered Nurse

## 2022-11-24 ENCOUNTER — Encounter: Payer: Self-pay | Admitting: Registered Nurse

## 2022-11-24 VITALS — BP 98/60 | HR 77 | Ht 64.0 in | Wt 253.8 lb

## 2022-11-24 DIAGNOSIS — M7918 Myalgia, other site: Secondary | ICD-10-CM

## 2022-11-24 DIAGNOSIS — Z79891 Long term (current) use of opiate analgesic: Secondary | ICD-10-CM | POA: Diagnosis not present

## 2022-11-24 DIAGNOSIS — G8929 Other chronic pain: Secondary | ICD-10-CM | POA: Diagnosis not present

## 2022-11-24 DIAGNOSIS — M545 Low back pain, unspecified: Secondary | ICD-10-CM | POA: Diagnosis not present

## 2022-11-24 DIAGNOSIS — Z5181 Encounter for therapeutic drug level monitoring: Secondary | ICD-10-CM

## 2022-11-24 DIAGNOSIS — G894 Chronic pain syndrome: Secondary | ICD-10-CM | POA: Diagnosis not present

## 2022-11-24 DIAGNOSIS — M2550 Pain in unspecified joint: Secondary | ICD-10-CM

## 2022-11-24 DIAGNOSIS — M170 Bilateral primary osteoarthritis of knee: Secondary | ICD-10-CM

## 2022-11-24 MED ORDER — OXYCODONE-ACETAMINOPHEN 7.5-325 MG PO TABS: 1.0000 | ORAL_TABLET | Freq: Three times a day (TID) | ORAL | 0 refills | Status: DC | PRN

## 2022-11-24 NOTE — Progress Notes (Signed)
Subjective:    Patient ID: Crystal [name], female    DOB: 08-30-1950, 72 y.o.   MRN: [id number]  HPI: Crystal [name] is a 72 y.o. female who returns for follow up appointment for chronic pain and medication refill. She states her pain is located in her lower back pain, bilateral knee pain and generalized joint pain. She rates her pain 6. Her current exercise regime is walking and performing stretching exercises.  Crystal [name] Morphine equivalent is 33.75 MME.   Last Oral Swab was Performed on 09/27/2022, it was consistent.    Pain Inventory Average Pain 9 Pain Right Now 6 My pain is sharp, burning, and stabbing  In the last 24 hours, has pain interfered with the following? General activity 8 Relation with others 5 Enjoyment of life 9 What TIME of day is your pain at its worst? morning , evening, and night Sleep (in general) Good  Pain is worse with: walking, bending, standing, and some activites Pain improves with: rest, medication, and injections Relief from Meds: 9  Family History  Problem Relation Age of Onset   Hypertension Father    Heart disease Father    Cancer Father        prostate   Heart disease Other        (Maternal side) Ischemic heart disease   Diabetes Mellitus II Other    Arrhythmia Mother    Diabetes Mellitus II Mother        Borderline DM   Hypertension Mother    Asthma Mother    Cancer Brother    Dementia Brother    Hypertension Brother    Hypertension Daughter    Hypertension Daughter    Diabetes Daughter    Hypertension Daughter    Atrial fibrillation Daughter    GER disease Daughter    Hypertension Son    Anxiety disorder Son    Hypothyroidism Son    Breast cancer Maternal Grandmother        in her 26's   Social History   Socioeconomic History   Marital status: Widowed    Spouse name: Not on file   Number of children: Not on file   Years of education: Not on file   Highest education level: Not on file  Occupational History     Comment: retired LPN  Tobacco Use   Smoking status: Never   Smokeless tobacco: Never  Vaping Use   Vaping Use: Never used  Substance and Sexual Activity   Alcohol use: Not Currently   Drug use: Never   Sexual activity: Not Currently  Other Topics Concern   Not on file  Social History Narrative   [place].  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      As of 03/15/17:   Diet: 1800 Calorie      Caffeine: Yes      Married, if yes what year: Widowed, married 1973      Do you live in a house, apartment, assisted living, condo, trailer, ect: House, 1 stories, and 1 person      Pets: No      Current/Past profession: LPN, retired       Exercise: Yes, walking          Living Will: Yes   DNR: No   POA/HPOA: Yes      Functional Status:   Do you have difficulty bathing or dressing yourself? No  Do you have difficulty preparing food or eating? No   Do you have difficulty managing your medications? No   Do you have difficulty managing your finances? No   Do you have difficulty affording your medications? Yes   Social Determinants of Health   Financial Resource Strain: Low Risk  (10/11/2017)   Overall Financial Resource Strain (CARDIA)    Difficulty of Paying Living Expenses: Not hard at all  Food Insecurity: No Food Insecurity (01/03/2022)   Hunger Vital Sign    Worried About Running Out of Food in the Last Year: Never true    Ran Out of Food in the Last Year: Never true  Transportation Needs: No Transportation Needs (01/03/2022)   PRAPARE - Administrator, Civil Service (Medical): No    Lack of Transportation (Non-Medical): No  Physical Activity: Inactive (10/11/2017)   Exercise Vital Sign    Days of Exercise per Week: 0 days    Minutes of Exercise per Session: 0 min  Stress: Stress Concern Present (10/11/2017)   [name] - Occupational Stress Questionnaire     Feeling of Stress : To some extent  Social Connections: Somewhat Isolated (10/11/2017)   Social Connection and Isolation Panel [NHANES]    Frequency of Communication with Friends and Family: More than three times a week    Frequency of Social Gatherings with Friends and Family: More than three times a week    Attends Religious Services: More than 4 times per year    Active Member of [employer] or Organizations: No    Attends Banker Meetings: Never    Marital Status: Widowed   Past Surgical History:  Procedure Laterality Date   APPENDECTOMY  07/13/2017   BLADDER SUSPENSION  1990   "w/hysterectomy"   BOWEL RESECTION N/A 07/09/2018   Procedure: SMALL BOWEL RESECTION;  Surgeon: Jimmye Norman, MD;  [facility]: [facility];  Service: General;  Laterality: N/A;   CARDIAC CATHETERIZATION  2005   no obstructive CAD per patient   CARDIAC CATHETERIZATION  2018   CARDIAC DEFIBRILLATOR PLACEMENT  2005   BiV ICD implanted,  LV lead is an epicardial lead   CARPAL TUNNEL RELEASE Left 07/25/2014   Dr.Williamson    CARPAL TUNNEL RELEASE Right 04/08/2019   Procedure: RIGHT CARPAL TUNNEL RELEASE ENDOSCOPIC;  Surgeon: Mack Hook, MD;  Location[facility] OR;  Service: Orthopedics;  Laterality: Right;   CATARACT EXTRACTION W/ INTRAOCULAR LENS  IMPLANT, BILATERAL Bilateral    COLONIC STENT PLACEMENT N/A 08/31/2017   Procedure: COLONIC STENT PLACEMENT;  Surgeon: Jeani Hawking, MD;  Location: [facility] ENDOSCOPY;  Service: Endoscopy;  Laterality: N/A;   COLONOSCOPY     2010-2011 Dr.Kipreos    COLOSTOMY  12/2016   Hattie Perch 01/26/2017   COLOSTOMY REVERSAL N/A 07/13/2017   Procedure: COLOSTOMY REVERSAL;  Surgeon: Jimmye Norman, MD;  [facility] OR;  Service: General;  Laterality: N/A;   CYST REMOVAL HAND Right 05/2013   thumb   DILATION AND CURETTAGE OF UTERUS  X 5-6   ESOPHAGOGASTRODUODENOSCOPY (EGD) WITH PROPOFOL N/A 03/13/2020   Procedure: ESOPHAGOGASTRODUODENOSCOPY (EGD) WITH PROPOFOL;  Surgeon: Jeani Hawking, MD;   Location: [facility] ENDOSCOPY;  Service: Endoscopy;  Laterality: N/A;   EXCISION MASS ABDOMINAL N/A 07/09/2018   Procedure: EXPLORATION OF  ABDOMINAL WOUND ERAS PATHWAY;  Surgeon: Jimmye Norman, MD;  Location: [facility] OR;  Service: General;  Laterality: N/A;   FLEXIBLE SIGMOIDOSCOPY N/A 08/24/2017   Procedure: Arnell Sieving;  Surgeon: Jeani Hawking, MD;  Location: [facility] ENDOSCOPY;  Service: Endoscopy;  Laterality: N/A;   FLEXIBLE SIGMOIDOSCOPY N/A 08/31/2017   Procedure: FLEXIBLE SIGMOIDOSCOPY;  Surgeon: Jeani Hawking, MD;  Location: [facility] ENDOSCOPY;  Service: Endoscopy;  Laterality: N/A;  stent placement   FLEXIBLE SIGMOIDOSCOPY N/A 03/13/2020   Procedure: FLEXIBLE SIGMOIDOSCOPY;  Surgeon: Jeani Hawking, MD;  [facility] ENDOSCOPY;  Service: Endoscopy;  Laterality: N/A;   FLEXIBLE SIGMOIDOSCOPY N/A 12/18/2020   Procedure: FLEXIBLE SIGMOIDOSCOPY;  Surgeon: Jeani Hawking, MD;  [facility] ENDOSCOPY;  Service: Endoscopy;  Laterality: N/A;   HEMOSTASIS CLIP PLACEMENT  03/13/2020   Procedure: HEMOSTASIS CLIP PLACEMENT;  Surgeon: Jeani Hawking, MD;  [facility] ENDOSCOPY;  Service: Endoscopy;;   HEMOSTASIS CLIP PLACEMENT  12/18/2020   Procedure: HEMOSTASIS CLIP PLACEMENT;  Surgeon: Jeani Hawking, MD;  [facility] ENDOSCOPY;  Service: Endoscopy;;   HOT HEMOSTASIS N/A 03/13/2020   Procedure: HOT HEMOSTASIS (ARGON PLASMA COAGULATION/BICAP);  Surgeon: Jeani Hawking, MD;  Location: [facility] ENDOSCOPY;  Service: Endoscopy;  Laterality: N/A;   HOT HEMOSTASIS N/A 12/18/2020   Procedure: HOT HEMOSTASIS (ARGON PLASMA COAGULATION/BICAP);  Surgeon: Jeani Hawking, MD;  Location: [facility] ENDOSCOPY;  Service: Endoscopy;  Laterality: N/A;   IMPLANTABLE CARDIOVERTER DEFIBRILLATOR GENERATOR CHANGE  2008   INSERTION OF MESH N/A 07/09/2018   Procedure: INSERTION OF VICRYL MESH;  Surgeon: Jimmye Norman, MD;  [facility] OR;  Service: General;  Laterality: N/A;   LAPAROTOMY N/A 09/18/2017   Procedure: EXPLORATORY LAPAROTOMY;  Surgeon: Jimmye Norman, MD;  Location: [facility] OR;  Service: General;  Laterality: N/A;   LEAD REVISION  06/22/2018   LEAD REVISION/REPAIR N/A 06/22/2018   Procedure: LEAD REVISION/REPAIR;  Surgeon: Duke Salvia, MD;  Location: [facility] INVASIVE CV LAB;  Service: Cardiovascular;  Laterality: N/A;   LYSIS OF ADHESION N/A 09/18/2017   Procedure: LYSIS OF ADHESION;  Surgeon: Jimmye Norman, MD;  Location: [facility] OR;  Service: General;  Laterality: N/A;   LYSIS OF ADHESION N/A 07/09/2018   Procedure: LYSIS OF ADHESION;  Surgeon: Jimmye Norman, MD;  Location: [facility] OR;  Service: General;  Laterality: N/A;   PARTIAL COLECTOMY N/A 09/18/2017   Procedure: ILEOCOLECTOMY;  Surgeon: Jimmye Norman, MD;  Location: [facility] OR;  Service: General;  Laterality: N/A;   PARTIAL COLECTOMY  09/25/2017   POLYPECTOMY  03/13/2020   Procedure: POLYPECTOMY;  Surgeon: Jeani Hawking, MD;  [facility] ENDOSCOPY;  Service: Endoscopy;;   RIGHT/LEFT HEART CATH AND CORONARY ANGIOGRAPHY N/A 06/01/2018   Procedure: RIGHT/LEFT HEART CATH AND CORONARY ANGIOGRAPHY;  Surgeon: Corky Crafts, MD;  Location: [facility] INVASIVE CV LAB;  Service: Cardiovascular;  Laterality: N/A;   SIGMOIDOSCOPY N/A 09/18/2017   Procedure: SIGMOIDOSCOPY;  Surgeon: Jimmye Norman, MD;  [facility] OR;  Service: General;  Laterality: N/A;   SMALL INTESTINE SURGERY  07/09/2018   EXPLORATION OF  ABDOMINAL WOUND ERAS PATHWAYN/; MESH; LYSIS OF ADHESIONS   TOTAL ABDOMINAL HYSTERECTOMY  1990   TRANSFORAMINAL LUMBAR INTERBODY FUSION (TLIF) WITH PEDICLE SCREW FIXATION 2 LEVEL N/A 01/07/2020   Procedure: Lumbar Four-Five and Lumbar Five-Sacral One open lumbar decompression and transforaminal lumbar interbody fusion;  Surgeon: Jadene Pierini, MD;  Location: [facility] OR;  Service: Neurosurgery;  Laterality: N/A;  Lumbar Four-Five and Lumbar Five-Sacral One open lumbar decompression and transforaminal lumbar interbody fusion   TRIGGER FINGER RELEASE Right 05/213   TRIGGER FINGER RELEASE Right 04/08/2019    Procedure: RIGHT INDEX RELEASE TRIGGER FINGER/A-1 PULLEY;  Surgeon: Mack Hook, MD;  Location: [facility] OR;  Service: Orthopedics;  Laterality: Right;   TUBAL LIGATION  1980s   VESICOVAGINAL  FISTULA CLOSURE W/ TAH     Past Surgical History:  Procedure Laterality Date   APPENDECTOMY  07/13/2017   BLADDER SUSPENSION  1990   "w/hysterectomy"   BOWEL RESECTION N/A 07/09/2018   Procedure: SMALL BOWEL RESECTION;  Surgeon: Jimmye Norman, MD;  [facility] OR;  Service: General;  Laterality: N/A;   CARDIAC CATHETERIZATION  2005   no obstructive CAD per patient   CARDIAC CATHETERIZATION  2018   CARDIAC DEFIBRILLATOR PLACEMENT  2005   BiV ICD implanted,  LV lead is an epicardial lead   CARPAL TUNNEL RELEASE Left 07/25/2014   Dr.Williamson    CARPAL TUNNEL RELEASE Right 04/08/2019   Procedure: RIGHT CARPAL TUNNEL RELEASE ENDOSCOPIC;  Surgeon: Mack Hook, MD;  Location: [facility] OR;  Service: Orthopedics;  Laterality: Right;   CATARACT EXTRACTION W/ INTRAOCULAR LENS  IMPLANT, BILATERAL Bilateral    COLONIC STENT PLACEMENT N/A 08/31/2017   Procedure: COLONIC STENT PLACEMENT;  Surgeon: Jeani Hawking, MD;  Location: [facility] ENDOSCOPY;  Service: Endoscopy;  Laterality: N/A;   COLONOSCOPY     2010-2011 Dr.Kipreos    COLOSTOMY  12/2016   Hattie Perch 01/26/2017   COLOSTOMY REVERSAL N/A 07/13/2017   Procedure: COLOSTOMY REVERSAL;  Surgeon: Jimmye Norman, MD;  Location[facility] OR;  Service: General;  Laterality: N/A;   CYST REMOVAL HAND Right 05/2013   thumb   DILATION AND CURETTAGE OF UTERUS  X 5-6   ESOPHAGOGASTRODUODENOSCOPY (EGD) WITH PROPOFOL N/A 03/13/2020   Procedure: ESOPHAGOGASTRODUODENOSCOPY (EGD) WITH PROPOFOL;  Surgeon: Jeani Hawking, MD;  [facility] ENDOSCOPY;  Service: Endoscopy;  Laterality: N/A;   EXCISION MASS ABDOMINAL N/A 07/09/2018   Procedure: EXPLORATION OF  ABDOMINAL WOUND ERAS PATHWAY;  Surgeon: Jimmye Norman, MD;  Location: [facility] OR;  Service: General;  Laterality: N/A;   FLEXIBLE SIGMOIDOSCOPY N/A  08/24/2017   Procedure: Arnell Sieving;  Surgeon: Jeani Hawking, MD;  Location: [facility] ENDOSCOPY;  Service: Endoscopy;  Laterality: N/A;   FLEXIBLE SIGMOIDOSCOPY N/A 08/31/2017   Procedure: FLEXIBLE SIGMOIDOSCOPY;  Surgeon: Jeani Hawking, MD;  Location: [facility] ENDOSCOPY;  Service: Endoscopy;  Laterality: N/A;  stent placement   FLEXIBLE SIGMOIDOSCOPY N/A 03/13/2020   Procedure: FLEXIBLE SIGMOIDOSCOPY;  Surgeon: Jeani Hawking, MD;  Location[facility] ENDOSCOPY;  Service: Endoscopy;  Laterality: N/A;   FLEXIBLE SIGMOIDOSCOPY N/A 12/18/2020   Procedure: FLEXIBLE SIGMOIDOSCOPY;  Surgeon: Jeani Hawking, MD;  Location[facility] ENDOSCOPY;  Service: Endoscopy;  Laterality: N/A;   HEMOSTASIS CLIP PLACEMENT  03/13/2020   Procedure: HEMOSTASIS CLIP PLACEMENT;  Surgeon: Jeani Hawking, MD;  Location: [facility] ENDOSCOPY;  Service: Endoscopy;;   HEMOSTASIS CLIP PLACEMENT  12/18/2020   Procedure: HEMOSTASIS CLIP PLACEMENT;  Surgeon: Jeani Hawking, MD;  Location: [facility] ENDOSCOPY;  Service: Endoscopy;;   HOT HEMOSTASIS N/A 03/13/2020   Procedure: HOT HEMOSTASIS (ARGON PLASMA COAGULATION/BICAP);  Surgeon: Jeani Hawking, MD;  Location: [facility] ENDOSCOPY;  Service: Endoscopy;  Laterality: N/A;   HOT HEMOSTASIS N/A 12/18/2020   Procedure: HOT HEMOSTASIS (ARGON PLASMA COAGULATION/BICAP);  Surgeon: Jeani Hawking, MD;  Location: [facility] ENDOSCOPY;  Service: Endoscopy;  Laterality: N/A;   IMPLANTABLE CARDIOVERTER DEFIBRILLATOR GENERATOR CHANGE  2008   INSERTION OF MESH N/A 07/09/2018   Procedure: INSERTION OF VICRYL MESH;  Surgeon: Jimmye Norman, MD;  Location: [facility] OR;  Service: General;  Laterality: N/A;   LAPAROTOMY N/A 09/18/2017   Procedure: EXPLORATORY LAPAROTOMY;  Surgeon: Jimmye Norman, MD;  Location: [facility] OR;  Service: General;  Laterality: N/A;   LEAD REVISION  06/22/2018   LEAD REVISION/REPAIR N/A 06/22/2018   Procedure: LEAD REVISION/REPAIR;  Surgeon:  Duke Salvia, MD;  Location: [facility] INVASIVE CV LAB;  Service: Cardiovascular;  Laterality: N/A;    LYSIS OF ADHESION N/A 09/18/2017   Procedure: LYSIS OF ADHESION;  Surgeon: Jimmye Norman, MD;  Location: [facility] OR;  Service: General;  Laterality: N/A;   LYSIS OF ADHESION N/A 07/09/2018   Procedure: LYSIS OF ADHESION;  Surgeon: Jimmye Norman, MD;  Location: [facility] OR;  Service: General;  Laterality: N/A;   PARTIAL COLECTOMY N/A 09/18/2017   Procedure: ILEOCOLECTOMY;  Surgeon: Jimmye Norman, MD;  Location: [facility] OR;  Service: General;  Laterality: N/A;   PARTIAL COLECTOMY  09/25/2017   POLYPECTOMY  03/13/2020   Procedure: POLYPECTOMY;  Surgeon: Jeani Hawking, MD;  Location[facility] ENDOSCOPY;  Service: Endoscopy;;   RIGHT/LEFT HEART CATH AND CORONARY ANGIOGRAPHY N/A 06/01/2018   Procedure: RIGHT/LEFT HEART CATH AND CORONARY ANGIOGRAPHY;  Surgeon: Corky Crafts, MD;  Location: [facility] INVASIVE CV LAB;  Service: Cardiovascular;  Laterality: N/A;   SIGMOIDOSCOPY N/A 09/18/2017   Procedure: SIGMOIDOSCOPY;  Surgeon: Jimmye Norman, MD;  Location[facility] OR;  Service: General;  Laterality: N/A;   SMALL INTESTINE SURGERY  07/09/2018   EXPLORATION OF  ABDOMINAL WOUND ERAS PATHWAYN/; MESH; LYSIS OF ADHESIONS   TOTAL ABDOMINAL HYSTERECTOMY  1990   TRANSFORAMINAL LUMBAR INTERBODY FUSION (TLIF) WITH PEDICLE SCREW FIXATION 2 LEVEL N/A 01/07/2020   Procedure: Lumbar Four-Five and Lumbar Five-Sacral One open lumbar decompression and transforaminal lumbar interbody fusion;  Surgeon: Jadene Pierini, MD;  Location: [facility] OR;  Service: Neurosurgery;  Laterality: N/A;  Lumbar Four-Five and Lumbar Five-Sacral One open lumbar decompression and transforaminal lumbar interbody fusion   TRIGGER FINGER RELEASE Right 05/213   TRIGGER FINGER RELEASE Right 04/08/2019   Procedure: RIGHT INDEX RELEASE TRIGGER FINGER/A-1 PULLEY;  Surgeon: Mack Hook, MD;  Location: [facility] OR;  Service: Orthopedics;  Laterality: Right;   TUBAL LIGATION  1980s   VESICOVAGINAL FISTULA CLOSURE W/ TAH     Past Medical History:  Diagnosis Date   AICD (automatic  cardioverter/defibrillator) present    high RV threshold chronically, device was turned off in 2014; Device battery has been dead x 7 years; "turned it back on 06/22/2018"   Anemia, iron deficiency    Angioedema    felt to likely be due to ace inhibitors but says she has had this even off of medicines, appears to be tolerating ARBs chronically   Anxiety    Arthritis    "hands, legs, arms; bad in my back" (06/22/2018)   Asthmatic bronchitis with status asthmaticus    Bradycardia    CHF (congestive heart failure) (HCC)    Chronic lower back pain    Chronic pain    CKD (chronic kidney disease), stage III (HCC)    Coronary artery disease    Mild nonobstructive (30% LAD, 30% RCA) by 09/01/16 cath at [facility]   Degenerative joint disease    Depression    Diabetes mellitus, type 2 (HCC)    Diabetic peripheral neuropathy (HCC)    Dizziness    Dyspnea on exertion    Family history of adverse reaction to anesthesia    Mother has nausea   GERD (gastroesophageal reflux disease)    Gout    Heart valve disorder    History of blood transfusion 1981; ~ 2005; 12/2016   "childbirth; defibrillator OR; colostomy OR"   History of mononucleosis 03/2014   Hyperlipidemia    Hypertension    Hypothyroid    Insomnia    Intervertebral disc degeneration  Ischemic cardiomyopathy    LBBB (left bundle branch block)    Myocardial infarction [facility]) dx'd ~ 2005   Nonischemic cardiomyopathy (HCC)    s/p ICD in 2005. EF has since recovered   Obesity    On home oxygen therapy    "2L at night" (06/22/2018)   OSA on CPAP    Pneumonia    "couple times; last time was 12/2016" (06/22/2018)   PONV (postoperative nausea and vomiting)    Presence of permanent cardiac pacemaker    [employer]   Swelling    Syncope    Systemic hypertension    Vitamin D deficiency    BP 98/60   Pulse 77   Ht 5\' 4"  (1.626 m)   Wt 253 lb 12.8 oz (115.1 kg)   SpO2 91%   BMI 43.56 kg/m   Opioid Risk Score:    Fall Risk Score:  `1  Depression screen [facility] 2/9     10/26/2022   11:03 AM 09/27/2022   11:03 AM 08/02/2022   11:22 AM 07/19/2022   11:27 AM 06/21/2022   10:01 AM 03/08/2022   10:19 AM 01/26/2022    9:54 AM  Depression screen PHQ 2/9  Decreased Interest 0 0 0 0 0 0 0  Down, Depressed, Hopeless 0 0 0 0 0 0 0  PHQ - 2 Score 0 0 0 0 0 0 0      Review of Systems  Musculoskeletal:  Positive for back pain and neck pain.       Bilateral knee pain  All other systems reviewed and are negative.     Objective:   Physical Exam Vitals and nursing note reviewed.  Constitutional:      Appearance: Normal appearance.  Cardiovascular:     Rate and Rhythm: Normal rate and regular rhythm.     Pulses: Normal pulses.     Heart sounds: Normal heart sounds.  Pulmonary:     Effort: Pulmonary effort is normal.     Breath sounds: Normal breath sounds.  Musculoskeletal:     Cervical back: Normal range of motion and neck supple.     Comments: Normal Muscle Bulk and Muscle Testing Reveals:  Upper Extremities: Full ROM and Muscle Strength 5/5 Lumbar Paraspinal Tenderness: L-4-L-5 Lower Extremities: Full ROM and Muscle Strength 5/5 Arises from Chair slowly using walker for support Narrow Based  Gait     Skin:    General: Skin is warm and dry.  Neurological:     Mental Status: She is alert and oriented to person, place, and time.  Psychiatric:        Mood and Affect: Mood normal.        Behavior: Behavior normal.         Assessment & Plan:  Chronic Low Back Pain: Encouraged to increase HEP as Tolerated. Continue to Monitor. 11/24/2022 2. Bilateral Primary Osteoarthritis:  S/P Right Knee Genicular nerve radiofrequency neurotomy, with Dr Wynn Banker. On 08/02/2022 and S/P Left Knee Genicular Nerve Radiofrequency on 08/16/2022. With good relief noted.  : Indication Chronic post operative pain in the Knee, pain postop total knee replacement which has not responded to conservative management such as  physical therapy and medication management; Continue monitor. 11/24/2022 3. Myofascial Pain: S/P Trigger Point Injection on 06/29/2021 with Good Relief Noted. 11/24/2022 4. Chronic Pain Syndrome: Refilled: Oxycodone 7.5mg  /325 mg one tablet three times a day as needed for pain #90, Discontinue Hydrocodone due to itching, Ms. Subia verbalizes understanding.  We will continue  the opioid monitoring program, this consists of regular clinic visits, examinations, urine drug screen, pill counts as well as use of [place] Controlled Substance Reporting system. A 12 month History has been reviewed on the [place] Controlled Substance Reporting System on 11/24/2022. 5. Polyarthralgia: Continue HEP as tolerated. Continue current medication regimen. Continue to monitor. 11/24/2022   F/U in 1 month

## 2022-11-25 ENCOUNTER — Ambulatory Visit: Payer: Medicare Other | Attending: Physician Assistant

## 2022-11-25 DIAGNOSIS — E1165 Type 2 diabetes mellitus with hyperglycemia: Secondary | ICD-10-CM | POA: Diagnosis not present

## 2022-11-25 DIAGNOSIS — Z794 Long term (current) use of insulin: Secondary | ICD-10-CM | POA: Diagnosis not present

## 2022-11-25 DIAGNOSIS — I447 Left bundle-branch block, unspecified: Secondary | ICD-10-CM | POA: Diagnosis not present

## 2022-11-25 DIAGNOSIS — I428 Other cardiomyopathies: Secondary | ICD-10-CM | POA: Diagnosis not present

## 2022-11-25 DIAGNOSIS — N1832 Chronic kidney disease, stage 3b: Secondary | ICD-10-CM | POA: Diagnosis not present

## 2022-11-25 DIAGNOSIS — E782 Mixed hyperlipidemia: Secondary | ICD-10-CM

## 2022-11-26 LAB — BASIC METABOLIC PANEL
BUN/Creatinine Ratio: 21 (ref 12–28)
BUN: 53 mg/dL — ABNORMAL HIGH (ref 8–27)
CO2: 25 mmol/L (ref 20–29)
Calcium: 9 mg/dL (ref 8.7–10.3)
Chloride: 99 mmol/L (ref 96–106)
Creatinine, Ser: 2.52 mg/dL — ABNORMAL HIGH (ref 0.57–1.00)
Glucose: 130 mg/dL — ABNORMAL HIGH (ref 70–99)
Potassium: 4.2 mmol/L (ref 3.5–5.2)
Sodium: 139 mmol/L (ref 134–144)
eGFR: 20 mL/min/{1.73_m2} — ABNORMAL LOW (ref >59)

## 2022-12-14 ENCOUNTER — Ambulatory Visit: Payer: Self-pay

## 2022-12-14 NOTE — Patient Outreach (Signed)
  Care Coordination   Follow Up Visit Note   12/14/2022 Name: Crystal [name] MRN: [id number] DOB: 11/08/1950  Crystal [name] is a 72 y.o. year old female who sees Rodrigo Ran, MD for primary care. I spoke with  Crystal [name] by phone today.  What matters to the patients health and wellness today?  Patient would like to improve her shortness of breath and swelling. She would like to maintain and or improve her current renal function.     Goals Addressed             This Visit's Progress    To better manage swelling and shortness of breath       Care Coordination Interventions: Basic overview and discussion of pathophysiology of Heart Failure reviewed Provided education on low sodium diet Reviewed Heart Failure Action Plan in depth and provided written copy Assessed need for readable accurate scales in home Advised patient to weigh each morning after emptying bladder Discussed importance of daily weight and advised patient to weigh and record daily Reviewed role of diuretics in prevention of fluid overload and management of heart failure; Discussed the importance of keeping all appointments with provider Advised patient to discuss recommendations for daily fluid intake  with provider Reviewed scheduled/upcoming provider appointment including: next Cardiology follow up appointment scheduled for 12/27/22 @10 :05 AM     To maintain or improve current kidney function       Care Coordination Interventions: Assessed the Patient understanding of chronic kidney disease    Evaluation of current treatment plan related to chronic kidney disease self management and patient's adherence to plan as established by provider      Advised patient to discuss recommendations for recommendations for daily fluid intake with provider    Provided education on kidney disease progression    Engage patient in early, proactive and ongoing discussion about goals of care and what matters most to them    Reviewed  scheduled/upcoming provider appointment including: next Nephrology follow up appointment scheduled for 02/06/23    Interventions Today    Flowsheet Row Most Recent Value  Chronic Disease   Chronic disease during today's visit Diabetes, Congestive Heart Failure (CHF), Chronic Kidney Disease/End Stage Renal Disease (ESRD)  General Interventions   General Interventions Discussed/Reviewed General Interventions Reviewed, General Interventions Discussed, Labs, Doctor Visits  Doctor Visits Discussed/Reviewed Doctor Visits Discussed, Doctor Visits Reviewed, Specialist, PCP  Education Interventions   Education Provided Provided Education, Provided Printed Education  Provided Verbal Education On Nutrition, Labs, Medication, When to see the doctor  Labs Reviewed Kidney Function, Hgb A1c  Nutrition Interventions   Nutrition Discussed/Reviewed Nutrition Discussed, Nutrition Reviewed, Decreasing salt, Fluid intake  Pharmacy Interventions   Pharmacy Dicussed/Reviewed Pharmacy Topics Discussed, Medications and their functions, Pharmacy Topics Reviewed          SDOH assessments and interventions completed:  No     Care Coordination Interventions:  Yes, provided   Follow up plan: Follow up call scheduled for 12/29/22 @11 :00 AM    Encounter Outcome:  Pt. Visit Completed

## 2022-12-14 NOTE — Patient Instructions (Signed)
Visit Information  Thank you for taking time to visit with me today. Please don't hesitate to contact me if I can be of assistance to you.   Following are the goals we discussed today:   Goals Addressed             This Visit's Progress    To better manage swelling and shortness of breath       Care Coordination Interventions: Basic overview and discussion of pathophysiology of Heart Failure reviewed Provided education on low sodium diet Reviewed Heart Failure Action Plan in depth and provided written copy Assessed need for readable accurate scales in home Advised patient to weigh each morning after emptying bladder Discussed importance of daily weight and advised patient to weigh and record daily Reviewed role of diuretics in prevention of fluid overload and management of heart failure; Discussed the importance of keeping all appointments with provider Advised patient to discuss recommendations for daily fluid intake  with provider Reviewed scheduled/upcoming provider appointment including: next Cardiology follow up appointment scheduled for 12/27/22 @10 :05 AM        To maintain or improve current kidney function       Care Coordination Interventions: Assessed the Patient understanding of chronic kidney disease    Evaluation of current treatment plan related to chronic kidney disease self management and patient's adherence to plan as established by provider      Advised patient to discuss recommendations for recommendations for daily fluid intake with provider    Provided education on kidney disease progression    Engage patient in early, proactive and ongoing discussion about goals of care and what matters most to them    Reviewed scheduled/upcoming provider appointment including: next Nephrology follow up appointment scheduled for 02/06/23           Our next appointment is by telephone on 12/29/22 at 11:00 AM  Please call the care guide team at [phone] if you need to  cancel or reschedule your appointment.   If you are experiencing a Mental Health or Behavioral Health Crisis or need someone to talk to, please call 1-800-273-TALK (toll free, 24 hour hotline) go to [facility] [address], [place] [phone])  Patient verbalizes understanding of instructions and care plan provided today and agrees to view in MyChart. Active MyChart status and patient understanding of how to access instructions and care plan via MyChart confirmed with patient.     Delsa Sale, RN, BSN, CCM Care Management Coordinator [facility] [place] Care Management  Direct Phone: [phone]

## 2022-12-15 DIAGNOSIS — N184 Chronic kidney disease, stage 4 (severe): Secondary | ICD-10-CM | POA: Diagnosis not present

## 2022-12-16 ENCOUNTER — Ambulatory Visit: Payer: Medicare Other | Admitting: Physician Assistant

## 2022-12-20 DIAGNOSIS — E039 Hypothyroidism, unspecified: Secondary | ICD-10-CM | POA: Diagnosis not present

## 2022-12-20 DIAGNOSIS — I10 Essential (primary) hypertension: Secondary | ICD-10-CM | POA: Diagnosis not present

## 2022-12-20 DIAGNOSIS — Z95810 Presence of automatic (implantable) cardiac defibrillator: Secondary | ICD-10-CM | POA: Diagnosis not present

## 2022-12-20 DIAGNOSIS — E1129 Type 2 diabetes mellitus with other diabetic kidney complication: Secondary | ICD-10-CM | POA: Diagnosis not present

## 2022-12-20 DIAGNOSIS — J4540 Moderate persistent asthma, uncomplicated: Secondary | ICD-10-CM | POA: Diagnosis not present

## 2022-12-20 DIAGNOSIS — E785 Hyperlipidemia, unspecified: Secondary | ICD-10-CM | POA: Diagnosis not present

## 2022-12-20 DIAGNOSIS — N184 Chronic kidney disease, stage 4 (severe): Secondary | ICD-10-CM | POA: Diagnosis not present

## 2022-12-20 DIAGNOSIS — E1159 Type 2 diabetes mellitus with other circulatory complications: Secondary | ICD-10-CM | POA: Diagnosis not present

## 2022-12-20 DIAGNOSIS — I509 Heart failure, unspecified: Secondary | ICD-10-CM | POA: Diagnosis not present

## 2022-12-20 DIAGNOSIS — D649 Anemia, unspecified: Secondary | ICD-10-CM | POA: Diagnosis not present

## 2022-12-20 DIAGNOSIS — Z794 Long term (current) use of insulin: Secondary | ICD-10-CM | POA: Diagnosis not present

## 2022-12-20 DIAGNOSIS — Z0000 Encounter for general adult medical examination without abnormal findings: Secondary | ICD-10-CM | POA: Diagnosis not present

## 2022-12-20 DIAGNOSIS — I130 Hypertensive heart and chronic kidney disease with heart failure and stage 1 through stage 4 chronic kidney disease, or unspecified chronic kidney disease: Secondary | ICD-10-CM | POA: Diagnosis not present

## 2022-12-21 ENCOUNTER — Other Ambulatory Visit (HOSPITAL_COMMUNITY): Payer: Medicare Other

## 2022-12-23 ENCOUNTER — Ambulatory Visit (INDEPENDENT_AMBULATORY_CARE_PROVIDER_SITE_OTHER): Payer: Medicare Other

## 2022-12-23 DIAGNOSIS — I447 Left bundle-branch block, unspecified: Secondary | ICD-10-CM

## 2022-12-26 ENCOUNTER — Encounter: Payer: Self-pay | Admitting: Registered Nurse

## 2022-12-26 ENCOUNTER — Encounter: Payer: Medicare Other | Attending: Registered Nurse | Admitting: Registered Nurse

## 2022-12-26 VITALS — BP 134/70 | HR 75 | Ht 64.0 in | Wt 255.0 lb

## 2022-12-26 DIAGNOSIS — Z5181 Encounter for therapeutic drug level monitoring: Secondary | ICD-10-CM | POA: Diagnosis not present

## 2022-12-26 DIAGNOSIS — G8929 Other chronic pain: Secondary | ICD-10-CM | POA: Diagnosis not present

## 2022-12-26 DIAGNOSIS — M545 Low back pain, unspecified: Secondary | ICD-10-CM | POA: Insufficient documentation

## 2022-12-26 DIAGNOSIS — M2550 Pain in unspecified joint: Secondary | ICD-10-CM | POA: Diagnosis not present

## 2022-12-26 DIAGNOSIS — Z79891 Long term (current) use of opiate analgesic: Secondary | ICD-10-CM | POA: Diagnosis not present

## 2022-12-26 DIAGNOSIS — G894 Chronic pain syndrome: Secondary | ICD-10-CM | POA: Insufficient documentation

## 2022-12-26 DIAGNOSIS — M170 Bilateral primary osteoarthritis of knee: Secondary | ICD-10-CM | POA: Insufficient documentation

## 2022-12-26 LAB — CUP PACEART REMOTE DEVICE CHECK
Battery Remaining Longevity: 60 mo
Battery Remaining Percentage: 67 %
Brady Statistic RA Percent Paced: 0 %
Brady Statistic RV Percent Paced: 99 %
Date Time Interrogation Session: 20240602211200
HighPow Impedance: 68 Ohm
Implantable Lead Connection Status: 753985
Implantable Lead Connection Status: 753985
Implantable Lead Connection Status: 753985
Implantable Lead Implant Date: 20191129
Implantable Lead Implant Date: 20191129
Implantable Lead Implant Date: 20191129
Implantable Lead Location: 753858
Implantable Lead Location: 753859
Implantable Lead Location: 753860
Implantable Lead Model: 293
Implantable Lead Model: 4086
Implantable Lead Serial Number: 226691
Implantable Lead Serial Number: 440868
Implantable Pulse Generator Implant Date: 20191129
Lead Channel Impedance Value: 479 Ohm
Lead Channel Impedance Value: 557 Ohm
Lead Channel Impedance Value: 848 Ohm
Lead Channel Setting Pacing Amplitude: 2 V
Lead Channel Setting Pacing Amplitude: 2.5 V
Lead Channel Setting Pacing Amplitude: 3 V
Lead Channel Setting Pacing Pulse Width: 0.4 ms
Lead Channel Setting Pacing Pulse Width: 1 ms
Lead Channel Setting Sensing Sensitivity: 0.5 mV
Lead Channel Setting Sensing Sensitivity: 1 mV
Pulse Gen Serial Number: 227627

## 2022-12-26 MED ORDER — OXYCODONE-ACETAMINOPHEN 7.5-325 MG PO TABS: 1.0000 | ORAL_TABLET | Freq: Three times a day (TID) | ORAL | 0 refills | Status: DC | PRN

## 2022-12-26 NOTE — Progress Notes (Signed)
Subjective:    Patient ID: Crystal [name], female    DOB: 1951/06/22, 72 y.o.   MRN: [id number]  HPI: Crystal [name] is a 72 y.o. female who returns for follow up appointment for chronic pain and medication refill. She states her pain is located in her lower back, bilateral knees and generalized joint pain. She rates her pain 6. Her current exercise regime is walking and performing stretching exercises.  Crystal [name] Morphine equivalent is 33.75 MME.   Last UDS was Performed on 09/27/2022, it was consistent.     Pain Inventory Average Pain 9 Pain Right Now 6 My pain is constant, sharp, burning, stabbing, and aching  In the last 24 hours, has pain interfered with the following? General activity 10 Relation with others 7 Enjoyment of life 9 What TIME of day is your pain at its worst? morning , evening, night, and varies Sleep (in general) Good  Pain is worse with: walking, bending, standing, and some activites Pain improves with: rest, medication, and injections Relief from Meds: 9  Family History  Problem Relation Age of Onset   Hypertension Father    Heart disease Father    Cancer Father        prostate   Heart disease Other        (Maternal side) Ischemic heart disease   Diabetes Mellitus II Other    Arrhythmia Mother    Diabetes Mellitus II Mother        Borderline DM   Hypertension Mother    Asthma Mother    Cancer Brother    Dementia Brother    Hypertension Brother    Hypertension Daughter    Hypertension Daughter    Diabetes Daughter    Hypertension Daughter    Atrial fibrillation Daughter    GER disease Daughter    Hypertension Son    Anxiety disorder Son    Hypothyroidism Son    Breast cancer Maternal Grandmother        in her 4's   Social History   Socioeconomic History   Marital status: Widowed    Spouse name: Not on file   Number of children: Not on file   Years of education: Not on file   Highest education level: Not on file  Occupational  History    Comment: retired LPN  Tobacco Use   Smoking status: Never   Smokeless tobacco: Never  Vaping Use   Vaping Use: Never used  Substance and Sexual Activity   Alcohol use: Not Currently   Drug use: Never   Sexual activity: Not Currently  Other Topics Concern   Not on file  Social History Narrative   [place]  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      As of 03/15/17:   Diet: 1800 Calorie      Caffeine: Yes      Married, if yes what year: Widowed, married 1973      Do you live in a house, apartment, assisted living, condo, trailer, ect: House, 1 stories, and 1 person      Pets: No      Current/Past profession: LPN, retired       Exercise: Yes, walking          Living Will: Yes   DNR: No   POA/HPOA: Yes      Functional Status:   Do you have difficulty bathing or dressing yourself?  No   Do you have difficulty preparing food or eating? No   Do you have difficulty managing your medications? No   Do you have difficulty managing your finances? No   Do you have difficulty affording your medications? Yes   Social Determinants of Health   Financial Resource Strain: Low Risk  (10/11/2017)   Overall Financial Resource Strain (CARDIA)    Difficulty of Paying Living Expenses: Not hard at all  Food Insecurity: No Food Insecurity (01/03/2022)   Hunger Vital Sign    Worried About Running Out of Food in the Last Year: Never true    Ran Out of Food in the Last Year: Never true  Transportation Needs: No Transportation Needs (01/03/2022)   PRAPARE - Administrator, Civil Service (Medical): No    Lack of Transportation (Non-Medical): No  Physical Activity: Inactive (10/11/2017)   Exercise Vital Sign    Days of Exercise per Week: 0 days    Minutes of Exercise per Session: 0 min  Stress: Stress Concern Present (10/11/2017)   [name] - Occupational Stress  Questionnaire    Feeling of Stress : To some extent  Social Connections: Somewhat Isolated (10/11/2017)   Social Connection and Isolation Panel [NHANES]    Frequency of Communication with Friends and Family: More than three times a week    Frequency of Social Gatherings with Friends and Family: More than three times a week    Attends Religious Services: More than 4 times per year    Active Member of [employer] or Organizations: No    Attends Banker Meetings: Never    Marital Status: Widowed   Past Surgical History:  Procedure Laterality Date   APPENDECTOMY  07/13/2017   BLADDER SUSPENSION  1990   "w/hysterectomy"   BOWEL RESECTION N/A 07/09/2018   Procedure: SMALL BOWEL RESECTION;  Surgeon: Jimmye Norman, MD;  [facility];  Service: General;  Laterality: N/A;   CARDIAC CATHETERIZATION  2005   no obstructive CAD per patient   CARDIAC CATHETERIZATION  2018   CARDIAC DEFIBRILLATOR PLACEMENT  2005   BiV ICD implanted,  LV lead is an epicardial lead   CARPAL TUNNEL RELEASE Left 07/25/2014   Dr.Williamson    CARPAL TUNNEL RELEASE Right 04/08/2019   Procedure: RIGHT CARPAL TUNNEL RELEASE ENDOSCOPIC;  Surgeon: Mack Hook, MD;  Location: [facility] OR;  Service: Orthopedics;  Laterality: Right;   CATARACT EXTRACTION W/ INTRAOCULAR LENS  IMPLANT, BILATERAL Bilateral    COLONIC STENT PLACEMENT N/A 08/31/2017   Procedure: COLONIC STENT PLACEMENT;  Surgeon: Jeani Hawking, MD;  Location: [facility] ENDOSCOPY;  Service: Endoscopy;  Laterality: N/A;   COLONOSCOPY     2010-2011 Dr.Kipreos    COLOSTOMY  12/2016   Hattie Perch 01/26/2017   COLOSTOMY REVERSAL N/A 07/13/2017   Procedure: COLOSTOMY REVERSAL;  Surgeon: Jimmye Norman, MD;  [facility];  Service: General;  Laterality: N/A;   CYST REMOVAL HAND Right 05/2013   thumb   DILATION AND CURETTAGE OF UTERUS  X 5-6   ESOPHAGOGASTRODUODENOSCOPY (EGD) WITH PROPOFOL N/A 03/13/2020   Procedure: ESOPHAGOGASTRODUODENOSCOPY (EGD) WITH PROPOFOL;  Surgeon: Jeani Hawking, MD;  Location: [facility] ENDOSCOPY;  Service: Endoscopy;  Laterality: N/A;   EXCISION MASS ABDOMINAL N/A 07/09/2018   Procedure: EXPLORATION OF  ABDOMINAL WOUND ERAS PATHWAY;  Surgeon: Jimmye Norman, MD;  Location: [facility] OR;  Service: General;  Laterality: N/A;   FLEXIBLE SIGMOIDOSCOPY N/A 08/24/2017   Procedure: Arnell Sieving;  Surgeon: Jeani Hawking, MD;  [facility] ENDOSCOPY;  Service: Endoscopy;  Laterality: N/A;   FLEXIBLE SIGMOIDOSCOPY N/A 08/31/2017   Procedure: FLEXIBLE SIGMOIDOSCOPY;  Surgeon: Jeani Hawking, MD;  Location: [facility] ENDOSCOPY;  Service: Endoscopy;  Laterality: N/A;  stent placement   FLEXIBLE SIGMOIDOSCOPY N/A 03/13/2020   Procedure: FLEXIBLE SIGMOIDOSCOPY;  Surgeon: Jeani Hawking, MD;  [facility] ENDOSCOPY;  Service: Endoscopy;  Laterality: N/A;   FLEXIBLE SIGMOIDOSCOPY N/A 12/18/2020   Procedure: FLEXIBLE SIGMOIDOSCOPY;  Surgeon: Jeani Hawking, MD;  [facility] ENDOSCOPY;  Service: Endoscopy;  Laterality: N/A;   HEMOSTASIS CLIP PLACEMENT  03/13/2020   Procedure: HEMOSTASIS CLIP PLACEMENT;  Surgeon: Jeani Hawking, MD;  [facility] ENDOSCOPY;  Service: Endoscopy;;   HEMOSTASIS CLIP PLACEMENT  12/18/2020   Procedure: HEMOSTASIS CLIP PLACEMENT;  Surgeon: Jeani Hawking, MD;  [facility] ENDOSCOPY;  Service: Endoscopy;;   HOT HEMOSTASIS N/A 03/13/2020   Procedure: HOT HEMOSTASIS (ARGON PLASMA COAGULATION/BICAP);  Surgeon: Jeani Hawking, MD;  Location: [facility] ENDOSCOPY;  Service: Endoscopy;  Laterality: N/A;   HOT HEMOSTASIS N/A 12/18/2020   Procedure: HOT HEMOSTASIS (ARGON PLASMA COAGULATION/BICAP);  Surgeon: Jeani Hawking, MD;  Location: [facility] ENDOSCOPY;  Service: Endoscopy;  Laterality: N/A;   IMPLANTABLE CARDIOVERTER DEFIBRILLATOR GENERATOR CHANGE  2008   INSERTION OF MESH N/A 07/09/2018   Procedure: INSERTION OF VICRYL MESH;  Surgeon: Jimmye Norman, MD;  [facility];  Service: General;  Laterality: N/A;   LAPAROTOMY N/A 09/18/2017   Procedure: EXPLORATORY LAPAROTOMY;   Surgeon: Jimmye Norman, MD;  Location: [facility] OR;  Service: General;  Laterality: N/A;   LEAD REVISION  06/22/2018   LEAD REVISION/REPAIR N/A 06/22/2018   Procedure: LEAD REVISION/REPAIR;  Surgeon: Duke Salvia, MD;  Location: [facility] INVASIVE CV LAB;  Service: Cardiovascular;  Laterality: N/A;   LYSIS OF ADHESION N/A 09/18/2017   Procedure: LYSIS OF ADHESION;  Surgeon: Jimmye Norman, MD;  Location: [facility] OR;  Service: General;  Laterality: N/A;   LYSIS OF ADHESION N/A 07/09/2018   Procedure: LYSIS OF ADHESION;  Surgeon: Jimmye Norman, MD;  Location: [facility] OR;  Service: General;  Laterality: N/A;   PARTIAL COLECTOMY N/A 09/18/2017   Procedure: ILEOCOLECTOMY;  Surgeon: Jimmye Norman, MD;  Location: [facility] OR;  Service: General;  Laterality: N/A;   PARTIAL COLECTOMY  09/25/2017   POLYPECTOMY  03/13/2020   Procedure: POLYPECTOMY;  Surgeon: Jeani Hawking, MD;  [facility] ENDOSCOPY;  Service: Endoscopy;;   RIGHT/LEFT HEART CATH AND CORONARY ANGIOGRAPHY N/A 06/01/2018   Procedure: RIGHT/LEFT HEART CATH AND CORONARY ANGIOGRAPHY;  Surgeon: Corky Crafts, MD;  Location: [facility] INVASIVE CV LAB;  Service: Cardiovascular;  Laterality: N/A;   SIGMOIDOSCOPY N/A 09/18/2017   Procedure: SIGMOIDOSCOPY;  Surgeon: Jimmye Norman, MD;  [facility];  Service: General;  Laterality: N/A;   SMALL INTESTINE SURGERY  07/09/2018   EXPLORATION OF  ABDOMINAL WOUND ERAS PATHWAYN/; MESH; LYSIS OF ADHESIONS   TOTAL ABDOMINAL HYSTERECTOMY  1990   TRANSFORAMINAL LUMBAR INTERBODY FUSION (TLIF) WITH PEDICLE SCREW FIXATION 2 LEVEL N/A 01/07/2020   Procedure: Lumbar Four-Five and Lumbar Five-Sacral One open lumbar decompression and transforaminal lumbar interbody fusion;  Surgeon: Jadene Pierini, MD;  [facility];  Service: Neurosurgery;  Laterality: N/A;  Lumbar Four-Five and Lumbar Five-Sacral One open lumbar decompression and transforaminal lumbar interbody fusion   TRIGGER FINGER RELEASE Right 05/213   TRIGGER FINGER RELEASE Right  04/08/2019   Procedure: RIGHT INDEX RELEASE TRIGGER FINGER/A-1 PULLEY;  Surgeon: Mack Hook, MD;  Location: [facility] OR;  Service: Orthopedics;  Laterality: Right;   TUBAL LIGATION  1980s   VESICOVAGINAL FISTULA CLOSURE W/ TAH     Past Surgical History:  Procedure Laterality Date   APPENDECTOMY  07/13/2017   BLADDER SUSPENSION  1990   "w/hysterectomy"   BOWEL RESECTION N/A 07/09/2018   Procedure: SMALL BOWEL RESECTION;  Surgeon: Jimmye Norman, MD;  [facility];  Service: General;  Laterality: N/A;   CARDIAC CATHETERIZATION  2005   no obstructive CAD per patient   CARDIAC CATHETERIZATION  2018   CARDIAC DEFIBRILLATOR PLACEMENT  2005   BiV ICD implanted,  LV lead is an epicardial lead   CARPAL TUNNEL RELEASE Left 07/25/2014   Dr.Williamson    CARPAL TUNNEL RELEASE Right 04/08/2019   Procedure: RIGHT CARPAL TUNNEL RELEASE ENDOSCOPIC;  Surgeon: Mack Hook, MD;  Location: [facility] OR;  Service: Orthopedics;  Laterality: Right;   CATARACT EXTRACTION W/ INTRAOCULAR LENS  IMPLANT, BILATERAL Bilateral    COLONIC STENT PLACEMENT N/A 08/31/2017   Procedure: COLONIC STENT PLACEMENT;  Surgeon: Jeani Hawking, MD;  Location: [facility] ENDOSCOPY;  Service: Endoscopy;  Laterality: N/A;   COLONOSCOPY     2010-2011 Dr.Kipreos    COLOSTOMY  12/2016   Hattie Perch 01/26/2017   COLOSTOMY REVERSAL N/A 07/13/2017   Procedure: COLOSTOMY REVERSAL;  Surgeon: Jimmye Norman, MD;  [facility];  Service: General;  Laterality: N/A;   CYST REMOVAL HAND Right 05/2013   thumb   DILATION AND CURETTAGE OF UTERUS  X 5-6   ESOPHAGOGASTRODUODENOSCOPY (EGD) WITH PROPOFOL N/A 03/13/2020   Procedure: ESOPHAGOGASTRODUODENOSCOPY (EGD) WITH PROPOFOL;  Surgeon: Jeani Hawking, MD;  [facility] ENDOSCOPY;  Service: Endoscopy;  Laterality: N/A;   EXCISION MASS ABDOMINAL N/A 07/09/2018   Procedure: EXPLORATION OF  ABDOMINAL WOUND ERAS PATHWAY;  Surgeon: Jimmye Norman, MD;  Location: [facility] OR;  Service: General;  Laterality: N/A;   FLEXIBLE  SIGMOIDOSCOPY N/A 08/24/2017   Procedure: Arnell Sieving;  Surgeon: Jeani Hawking, MD;  Location: [facility] ENDOSCOPY;  Service: Endoscopy;  Laterality: N/A;   FLEXIBLE SIGMOIDOSCOPY N/A 08/31/2017   Procedure: FLEXIBLE SIGMOIDOSCOPY;  Surgeon: Jeani Hawking, MD;  Location: [facility] ENDOSCOPY;  Service: Endoscopy;  Laterality: N/A;  stent placement   FLEXIBLE SIGMOIDOSCOPY N/A 03/13/2020   Procedure: FLEXIBLE SIGMOIDOSCOPY;  Surgeon: Jeani Hawking, MD;  Location: [facility] ENDOSCOPY;  Service: Endoscopy;  Laterality: N/A;   FLEXIBLE SIGMOIDOSCOPY N/A 12/18/2020   Procedure: FLEXIBLE SIGMOIDOSCOPY;  Surgeon: Jeani Hawking, MD;  Location: [facility] ENDOSCOPY;  Service: Endoscopy;  Laterality: N/A;   HEMOSTASIS CLIP PLACEMENT  03/13/2020   Procedure: HEMOSTASIS CLIP PLACEMENT;  Surgeon: Jeani Hawking, MD;  Location: [facility] ENDOSCOPY;  Service: Endoscopy;;   HEMOSTASIS CLIP PLACEMENT  12/18/2020   Procedure: HEMOSTASIS CLIP PLACEMENT;  Surgeon: Jeani Hawking, MD;  Location: [facility] ENDOSCOPY;  Service: Endoscopy;;   HOT HEMOSTASIS N/A 03/13/2020   Procedure: HOT HEMOSTASIS (ARGON PLASMA COAGULATION/BICAP);  Surgeon: Jeani Hawking, MD;  Location: [facility] ENDOSCOPY;  Service: Endoscopy;  Laterality: N/A;   HOT HEMOSTASIS N/A 12/18/2020   Procedure: HOT HEMOSTASIS (ARGON PLASMA COAGULATION/BICAP);  Surgeon: Jeani Hawking, MD;  Location: [facility] ENDOSCOPY;  Service: Endoscopy;  Laterality: N/A;   IMPLANTABLE CARDIOVERTER DEFIBRILLATOR GENERATOR CHANGE  2008   INSERTION OF MESH N/A 07/09/2018   Procedure: INSERTION OF VICRYL MESH;  Surgeon: Jimmye Norman, MD;  Location: [facility] OR;  Service: General;  Laterality: N/A;   LAPAROTOMY N/A 09/18/2017   Procedure: EXPLORATORY LAPAROTOMY;  Surgeon: Jimmye Norman, MD;  Location: [facility] OR;  Service: General;  Laterality: N/A;   LEAD REVISION  06/22/2018   LEAD REVISION/REPAIR N/A 06/22/2018   Procedure:  LEAD REVISION/REPAIR;  Surgeon: Duke Salvia, MD;  Location: [facility] INVASIVE CV LAB;  Service: Cardiovascular;   Laterality: N/A;   LYSIS OF ADHESION N/A 09/18/2017   Procedure: LYSIS OF ADHESION;  Surgeon: Jimmye Norman, MD;  Location: [facility] OR;  Service: General;  Laterality: N/A;   LYSIS OF ADHESION N/A 07/09/2018   Procedure: LYSIS OF ADHESION;  Surgeon: Jimmye Norman, MD;  Location: [facility] OR;  Service: General;  Laterality: N/A;   PARTIAL COLECTOMY N/A 09/18/2017   Procedure: ILEOCOLECTOMY;  Surgeon: Jimmye Norman, MD;  Location: [facility] OR;  Service: General;  Laterality: N/A;   PARTIAL COLECTOMY  09/25/2017   POLYPECTOMY  03/13/2020   Procedure: POLYPECTOMY;  Surgeon: Jeani Hawking, MD;  Location: [facility] ENDOSCOPY;  Service: Endoscopy;;   RIGHT/LEFT HEART CATH AND CORONARY ANGIOGRAPHY N/A 06/01/2018   Procedure: RIGHT/LEFT HEART CATH AND CORONARY ANGIOGRAPHY;  Surgeon: Corky Crafts, MD;  Location: [facility] INVASIVE CV LAB;  Service: Cardiovascular;  Laterality: N/A;   SIGMOIDOSCOPY N/A 09/18/2017   Procedure: SIGMOIDOSCOPY;  Surgeon: Jimmye Norman, MD;  [facility] OR;  Service: General;  Laterality: N/A;   SMALL INTESTINE SURGERY  07/09/2018   EXPLORATION OF  ABDOMINAL WOUND ERAS PATHWAYN/; MESH; LYSIS OF ADHESIONS   TOTAL ABDOMINAL HYSTERECTOMY  1990   TRANSFORAMINAL LUMBAR INTERBODY FUSION (TLIF) WITH PEDICLE SCREW FIXATION 2 LEVEL N/A 01/07/2020   Procedure: Lumbar Four-Five and Lumbar Five-Sacral One open lumbar decompression and transforaminal lumbar interbody fusion;  Surgeon: Jadene Pierini, MD;  Location: [facility] OR;  Service: Neurosurgery;  Laterality: N/A;  Lumbar Four-Five and Lumbar Five-Sacral One open lumbar decompression and transforaminal lumbar interbody fusion   TRIGGER FINGER RELEASE Right 05/213   TRIGGER FINGER RELEASE Right 04/08/2019   Procedure: RIGHT INDEX RELEASE TRIGGER FINGER/A-1 PULLEY;  Surgeon: Mack Hook, MD;  Location: [facility] OR;  Service: Orthopedics;  Laterality: Right;   TUBAL LIGATION  1980s   VESICOVAGINAL FISTULA CLOSURE W/ TAH     Past Medical History:  Diagnosis Date    AICD (automatic cardioverter/defibrillator) present    high RV threshold chronically, device was turned off in 2014; Device battery has been dead x 7 years; "turned it back on 06/22/2018"   Anemia, iron deficiency    Angioedema    felt to likely be due to ace inhibitors but says she has had this even off of medicines, appears to be tolerating ARBs chronically   Anxiety    Arthritis    "hands, legs, arms; bad in my back" (06/22/2018)   Asthmatic bronchitis with status asthmaticus    Bradycardia    CHF (congestive heart failure) (HCC)    Chronic lower back pain    Chronic pain    CKD (chronic kidney disease), stage III (HCC)    Coronary artery disease    Mild nonobstructive (30% LAD, 30% RCA) by 09/01/16 cath at [facility]   Degenerative joint disease    Depression    Diabetes mellitus, type 2 (HCC)    Diabetic peripheral neuropathy (HCC)    Dizziness    Dyspnea on exertion    Family history of adverse reaction to anesthesia    Mother has nausea   GERD (gastroesophageal reflux disease)    Gout    Heart valve disorder    History of blood transfusion 1981; ~ 2005; 12/2016   "childbirth; defibrillator OR; colostomy OR"   History of mononucleosis 03/2014   Hyperlipidemia    Hypertension    Hypothyroid    Insomnia    Intervertebral disc  degeneration    Ischemic cardiomyopathy    LBBB (left bundle branch block)    Myocardial infarction [facility]) dx'd ~ 2005   Nonischemic cardiomyopathy (HCC)    s/p ICD in 2005. EF has since recovered   Obesity    On home oxygen therapy    "2L at night" (06/22/2018)   OSA on CPAP    Pneumonia    "couple times; last time was 12/2016" (06/22/2018)   PONV (postoperative nausea and vomiting)    Presence of permanent cardiac pacemaker    [employer]   Swelling    Syncope    Systemic hypertension    Vitamin D deficiency    Ht 5\' 4"  (1.626 m)   Wt 244 lb (110.7 kg)   BMI 41.88 kg/m   Opioid Risk Score:   Fall Risk Score:   `1  Depression screen [facility] 2/9     10/26/2022   11:03 AM 09/27/2022   11:03 AM 08/02/2022   11:22 AM 07/19/2022   11:27 AM 06/21/2022   10:01 AM 03/08/2022   10:19 AM 01/26/2022    9:54 AM  Depression screen PHQ 2/9  Decreased Interest 0 0 0 0 0 0 0  Down, Depressed, Hopeless 0 0 0 0 0 0 0  PHQ - 2 Score 0 0 0 0 0 0 0     Review of Systems  Musculoskeletal:  Positive for back pain.       B/L knee wrist pain  All other systems reviewed and are negative.      Objective:   Physical Exam Vitals and nursing note reviewed.  Constitutional:      Appearance: Normal appearance. She is obese.  Cardiovascular:     Rate and Rhythm: Normal rate and regular rhythm.     Pulses: Normal pulses.     Heart sounds: Normal heart sounds.  Pulmonary:     Effort: Pulmonary effort is normal.     Breath sounds: Normal breath sounds.  Musculoskeletal:     Cervical back: Normal range of motion and neck supple.     Comments: Normal Muscle Bulk and Muscle Testing Reveals:  Upper Extremities: Full ROM and Muscle Strength 5/5  Lumbar Paraspinal Tenderness: L-3-L-5 Lower Extremities: Full ROM and Muscle Strength 5/5 Arises from chair slowly using walker for support Narrow Based  Gait     Skin:    General: Skin is warm and dry.  Neurological:     Mental Status: She is alert and oriented to person, place, and time.  Psychiatric:        Mood and Affect: Mood normal.        Behavior: Behavior normal.          Assessment & Plan:  Chronic Low Back Pain: Encouraged to increase HEP as Tolerated. Continue to Monitor. 12/26/2022 2. Bilateral Primary Osteoarthritis:  S/P Right Knee Genicular nerve radiofrequency neurotomy, with Dr Wynn Banker. On 08/02/2022 and S/P Left Knee Genicular Nerve Radiofrequency on 08/16/2022. With good relief noted.  : Indication Chronic post operative pain in the Knee, pain postop total knee replacement which has not responded to conservative management such as physical therapy and  medication management; Continue monitor. 12/26/2022 3. Myofascial Pain: S/P Trigger Point Injection on 06/29/2021 with Good Relief Noted. 12/26/2022 4. Chronic Pain Syndrome: Refilled: Oxycodone 7.5mg  /325 mg one tablet three times a day as needed for pain #90, Discontinue Hydrocodone due to itching, Ms. Mauney verbalizes understanding.  We will continue the opioid monitoring program, this consists of  regular clinic visits, examinations, urine drug screen, pill counts as well as use of [place] Controlled Substance Reporting system. A 12 month History has been reviewed on the [place] Controlled Substance Reporting System on 12/26/2022. 5. Polyarthralgia: Continue HEP as tolerated. Continue current medication regimen. Continue to monitor. 12/26/2022   F/U in 1 month

## 2022-12-26 NOTE — Progress Notes (Unsigned)
Office Visit    Patient Name: Crystal [name] Date of Encounter: 12/26/2022  PCP:  Rodrigo Ran, MD   [facility]  Cardiologist:  Lance Muss, MD  Advanced Practice Provider:  No care team member to display Electrophysiologist:  None   HPI    Crystal [name] is a 72 y.o. female with a past medical history of NICM dating back to 2005 with reported LVEF of 24% at that time, BiV AICD, anxiety,  CHF, bradycardia hypertension, hyperlipidemia, previous syncope, myocardial infarction and LBBB presents today for follow-up appointment.  She has a longstanding history of NICM dating back to 2005 with reported LVEF of 24% at that time.  Cardiac cath showed no significant CAD.  She has a chronic LBBB.  Had BiV AICD placed previously with epicardial LV leads with subsequent normalization of LVEF improved with BiV pacing greater than 55%.  Notes device reached ERI and her leads had increasing thresholds around that time.  We decided not to replace the device since normal LVEF and she was followed clinically.  Repeat echocardiogram 04/2014 with LVEF 50 to 55%.  Questionable history of angioedema on ACE inhibitor, she states she has been on ARB and has tolerated well.  She also has CKD.  Had echocardiogram in 2018 in [place] with no CAD and EF 50% per report.  Her other medical problems include colonoscopy for colitis.  It had been resolved.  She may gaining weight since that time.  Gained about 30 pounds.  She had carpal tunnel surgery.  She was followed by Dr. Graciela Husbands as well and had a cardiac catheterization 11/19 which showed proximal LAD 25% stenosis, ostial first diagonal lesion 25% stenosis, mid left main lesion was 10% stenosis, LV end-diastolic pressure mildly elevated.  LVEDP 17 mmHg, no aortic valve stenosis.  Hemodynamic findings consistent with mild pulmonary hypertension.  Echocardiogram 09/2018 showed severely reduced systolic function 25 to 30%.   Moderately dilated.  Mildly increased left ventricular wall thickness.  Left ventricle systolic parameters with impaired relaxation.  Left ventricle with diffuse hypokinesis.  Mild mitral annular calcification present.  Mild dilation of the aortic root which measured 39 mm.  Global severe reduction in LV systolic function, mild diastolic dysfunction, moderate LVE, mild LVH, and mild MR.  Hospitalized with COVID January 2021 and treated with Decadron and remdesivir.  Had back pain and had surgery 5/21 for sciatica that went well.  EF had normalized at that time no cardiac issues with surgery.  She got her COVID vaccines.  Had some anemia in 2021 and received 2 transfusions but improved with IV iron.  She was seen by me 4/26, she tells me that she has been experiencing some ankle swelling mostly on the left side.  This started about a month ago.  She also has had more labored breathing.  Even with seemingly effortless tasks like drinking water she gets short of breath.  She saw her kidney doctor about a month ago and things have been stable, creatinine 2.2.  She has been taking her torsemide every day this week.  She is also on [place] which used to work well for her.  She has not had an updated echocardiogram since 2021.  She denied any chest pain.  Her anginal equivalent is jaw and neck pain which she has not experienced.  She tells me her shortness of breath has gotten particularly worse over the last week or 2.  She has not had any leg pain  but has had chronic knee pain bilaterally  Today, she tells me that her SOB is better. Dr. Waynard Edwards switched her demedex to three times a week.  She does think that the Zaroxolyn helped in the short-term.  We discussed a referral to pulmonary (Dr. Delton Coombes).  We discussed her echocardiogram again in detail.  Not having any issues with her current medications.  Trying to be more active when she can.  Discussed heart healthy, low-sodium diet.   Reports no chest pain, pressure,  or tightness. No edema, orthopnea, PND. Reports no palpitations.    Past Medical History    Past Medical History:  Diagnosis Date   AICD (automatic cardioverter/defibrillator) present    high RV threshold chronically, device was turned off in 2014; Device battery has been dead x 7 years; "turned it back on 06/22/2018"   Anemia, iron deficiency    Angioedema    felt to likely be due to ace inhibitors but says she has had this even off of medicines, appears to be tolerating ARBs chronically   Anxiety    Arthritis    "hands, legs, arms; bad in my back" (06/22/2018)   Asthmatic bronchitis with status asthmaticus    Bradycardia    CHF (congestive heart failure) (HCC)    Chronic lower back pain    Chronic pain    CKD (chronic kidney disease), stage III (HCC)    Coronary artery disease    Mild nonobstructive (30% LAD, 30% RCA) by 09/01/16 cath at [facility]   Degenerative joint disease    Depression    Diabetes mellitus, type 2 (HCC)    Diabetic peripheral neuropathy (HCC)    Dizziness    Dyspnea on exertion    Family history of adverse reaction to anesthesia    Mother has nausea   GERD (gastroesophageal reflux disease)    Gout    Heart valve disorder    History of blood transfusion 1981; ~ 2005; 12/2016   "childbirth; defibrillator OR; colostomy OR"   History of mononucleosis 03/2014   Hyperlipidemia    Hypertension    Hypothyroid    Insomnia    Intervertebral disc degeneration    Ischemic cardiomyopathy    LBBB (left bundle branch block)    Myocardial infarction (HCC) dx'd ~ 2005   Nonischemic cardiomyopathy (HCC)    s/p ICD in 2005. EF has since recovered   Obesity    On home oxygen therapy    "2L at night" (06/22/2018)   OSA on CPAP    Pneumonia    "couple times; last time was 12/2016" (06/22/2018)   PONV (postoperative nausea and vomiting)    Presence of permanent cardiac pacemaker    [employer]   Swelling    Syncope    Systemic hypertension     Vitamin D deficiency    Past Surgical History:  Procedure Laterality Date   APPENDECTOMY  07/13/2017   BLADDER SUSPENSION  1990   "w/hysterectomy"   BOWEL RESECTION N/A 07/09/2018   Procedure: SMALL BOWEL RESECTION;  Surgeon: Jimmye Norman, MD;  Location: [facility] OR;  Service: General;  Laterality: N/A;   CARDIAC CATHETERIZATION  2005   no obstructive CAD per patient   CARDIAC CATHETERIZATION  2018   CARDIAC DEFIBRILLATOR PLACEMENT  2005   BiV ICD implanted,  LV lead is an epicardial lead   CARPAL TUNNEL RELEASE Left 07/25/2014   Dr.Williamson    CARPAL TUNNEL RELEASE Right 04/08/2019   Procedure: RIGHT CARPAL TUNNEL RELEASE ENDOSCOPIC;  Surgeon:  Mack Hook, MD;  Location: [facility] OR;  Service: Orthopedics;  Laterality: Right;   CATARACT EXTRACTION W/ INTRAOCULAR LENS  IMPLANT, BILATERAL Bilateral    COLONIC STENT PLACEMENT N/A 08/31/2017   Procedure: COLONIC STENT PLACEMENT;  Surgeon: Jeani Hawking, MD;  Location[facility] ENDOSCOPY;  Service: Endoscopy;  Laterality: N/A;   COLONOSCOPY     2010-2011 Dr.Kipreos    COLOSTOMY  12/2016   Hattie Perch 01/26/2017   COLOSTOMY REVERSAL N/A 07/13/2017   Procedure: COLOSTOMY REVERSAL;  Surgeon: Jimmye Norman, MD;  Location: [facility] OR;  Service: General;  Laterality: N/A;   CYST REMOVAL HAND Right 05/2013   thumb   DILATION AND CURETTAGE OF UTERUS  X 5-6   ESOPHAGOGASTRODUODENOSCOPY (EGD) WITH PROPOFOL N/A 03/13/2020   Procedure: ESOPHAGOGASTRODUODENOSCOPY (EGD) WITH PROPOFOL;  Surgeon: Jeani Hawking, MD;  Location: [facility] ENDOSCOPY;  Service: Endoscopy;  Laterality: N/A;   EXCISION MASS ABDOMINAL N/A 07/09/2018   Procedure: EXPLORATION OF  ABDOMINAL WOUND ERAS PATHWAY;  Surgeon: Jimmye Norman, MD;  Location: [facility] OR;  Service: General;  Laterality: N/A;   FLEXIBLE SIGMOIDOSCOPY N/A 08/24/2017   Procedure: Arnell Sieving;  Surgeon: Jeani Hawking, MD;  Location: [facility] ENDOSCOPY;  Service: Endoscopy;  Laterality: N/A;   FLEXIBLE SIGMOIDOSCOPY N/A 08/31/2017   Procedure:  FLEXIBLE SIGMOIDOSCOPY;  Surgeon: Jeani Hawking, MD;  Location: [facility] ENDOSCOPY;  Service: Endoscopy;  Laterality: N/A;  stent placement   FLEXIBLE SIGMOIDOSCOPY N/A 03/13/2020   Procedure: FLEXIBLE SIGMOIDOSCOPY;  Surgeon: Jeani Hawking, MD;  Location: [facility] ENDOSCOPY;  Service: Endoscopy;  Laterality: N/A;   FLEXIBLE SIGMOIDOSCOPY N/A 12/18/2020   Procedure: FLEXIBLE SIGMOIDOSCOPY;  Surgeon: Jeani Hawking, MD;  Location: [facility] ENDOSCOPY;  Service: Endoscopy;  Laterality: N/A;   HEMOSTASIS CLIP PLACEMENT  03/13/2020   Procedure: HEMOSTASIS CLIP PLACEMENT;  Surgeon: Jeani Hawking, MD;  Location: [facility] ENDOSCOPY;  Service: Endoscopy;;   HEMOSTASIS CLIP PLACEMENT  12/18/2020   Procedure: HEMOSTASIS CLIP PLACEMENT;  Surgeon: Jeani Hawking, MD;  Location: [facility] ENDOSCOPY;  Service: Endoscopy;;   HOT HEMOSTASIS N/A 03/13/2020   Procedure: HOT HEMOSTASIS (ARGON PLASMA COAGULATION/BICAP);  Surgeon: Jeani Hawking, MD;  Location: [facility] ENDOSCOPY;  Service: Endoscopy;  Laterality: N/A;   HOT HEMOSTASIS N/A 12/18/2020   Procedure: HOT HEMOSTASIS (ARGON PLASMA COAGULATION/BICAP);  Surgeon: Jeani Hawking, MD;  Location: [facility] ENDOSCOPY;  Service: Endoscopy;  Laterality: N/A;   IMPLANTABLE CARDIOVERTER DEFIBRILLATOR GENERATOR CHANGE  2008   INSERTION OF MESH N/A 07/09/2018   Procedure: INSERTION OF VICRYL MESH;  Surgeon: Jimmye Norman, MD;  Location: [facility] OR;  Service: General;  Laterality: N/A;   LAPAROTOMY N/A 09/18/2017   Procedure: EXPLORATORY LAPAROTOMY;  Surgeon: Jimmye Norman, MD;  Location: [facility] OR;  Service: General;  Laterality: N/A;   LEAD REVISION  06/22/2018   LEAD REVISION/REPAIR N/A 06/22/2018   Procedure: LEAD REVISION/REPAIR;  Surgeon: Duke Salvia, MD;  Location: [facility] INVASIVE CV LAB;  Service: Cardiovascular;  Laterality: N/A;   LYSIS OF ADHESION N/A 09/18/2017   Procedure: LYSIS OF ADHESION;  Surgeon: Jimmye Norman, MD;  Location: [facility] OR;  Service: General;  Laterality: N/A;   LYSIS OF ADHESION N/A 07/09/2018   Procedure:  LYSIS OF ADHESION;  Surgeon: Jimmye Norman, MD;  Location: [facility] OR;  Service: General;  Laterality: N/A;   PARTIAL COLECTOMY N/A 09/18/2017   Procedure: ILEOCOLECTOMY;  Surgeon: Jimmye Norman, MD;  Location: [facility] OR;  Service: General;  Laterality: N/A;   PARTIAL COLECTOMY  09/25/2017   POLYPECTOMY  03/13/2020   Procedure: POLYPECTOMY;  Surgeon: Jeani Hawking, MD;  Location: [facility] ENDOSCOPY;  Service: Endoscopy;;   RIGHT/LEFT HEART CATH AND CORONARY ANGIOGRAPHY N/A 06/01/2018   Procedure: RIGHT/LEFT HEART CATH AND CORONARY ANGIOGRAPHY;  Surgeon: Corky Crafts, MD;  Location: [facility] INVASIVE CV LAB;  Service: Cardiovascular;  Laterality: N/A;   SIGMOIDOSCOPY N/A 09/18/2017   Procedure: SIGMOIDOSCOPY;  Surgeon: Jimmye Norman, MD;  Location: [facility] OR;  Service: General;  Laterality: N/A;   SMALL INTESTINE SURGERY  07/09/2018   EXPLORATION OF  ABDOMINAL WOUND ERAS PATHWAYN/; MESH; LYSIS OF ADHESIONS   TOTAL ABDOMINAL HYSTERECTOMY  1990   TRANSFORAMINAL LUMBAR INTERBODY FUSION (TLIF) WITH PEDICLE SCREW FIXATION 2 LEVEL N/A 01/07/2020   Procedure: Lumbar Four-Five and Lumbar Five-Sacral One open lumbar decompression and transforaminal lumbar interbody fusion;  Surgeon: Jadene Pierini, MD;  Location: [facility] OR;  Service: Neurosurgery;  Laterality: N/A;  Lumbar Four-Five and Lumbar Five-Sacral One open lumbar decompression and transforaminal lumbar interbody fusion   TRIGGER FINGER RELEASE Right 05/213   TRIGGER FINGER RELEASE Right 04/08/2019   Procedure: RIGHT INDEX RELEASE TRIGGER FINGER/A-1 PULLEY;  Surgeon: Mack Hook, MD;  Location: [facility] OR;  Service: Orthopedics;  Laterality: Right;   TUBAL LIGATION  1980s   VESICOVAGINAL FISTULA CLOSURE W/ TAH      Allergies  Allergies  Allergen Reactions   Ace Inhibitors Swelling and Other (See Comments)    Angioedema Other reaction(s): swelling Other reaction(s): swelling Other reaction(s): swelling Other reaction(s): Unknown   Glucophage [Metformin Hcl] Other  (See Comments)    Renal failure   Telmisartan Other (See Comments)    AVOID ARB/ ACEi in this patient due to recurrent AKI   Advicor [Niacin-Lovastatin Er] Other (See Comments)    Muscle aches   Atorvastatin Other (See Comments)    Muscle aches   Bystolic [Nebivolol Hcl] Swelling    Whole body swelled   Erythromycin Diarrhea, Nausea And Vomiting and Other (See Comments)    *DERIVATIVES*  Other reaction(s): severe vomiting  Other reaction(s): Unknown   Lopid [Gemfibrozil] Other (See Comments)    Muscle aches   Statins Other (See Comments)    Muscle aches tolerates crestor Other reaction(s): Unknown Other reaction(s): muscle aches   Erythromycin Base Nausea And Vomiting   Hydromorphone Other (See Comments)    Confusion Other reaction(s): confusion.can take lower level narcs but those cause a lot of itching Other reaction(s): confusion.can take lower level narcs but those cause a lot of itching   Lisinopril    Losartan     Other reaction(s): stopped 12/2018. ? due to worse gfr Other reaction(s): stopped 12/2018. ? due to worse gfr Other reaction(s): stopped 12/2018. ? due to worse gfr Other reaction(s): stopped 12/2018. ? due to worse gfr   Nebivolol Hcl Swelling    Other reaction(s): swelling  Other reaction(s): Unknown  Other Reaction(s): Not available   Other     Other reaction(s): muscle aches   Sacubitril-Valsartan     Other reaction(s): itching Other reaction(s): itching Other reaction(s): itching Other reaction(s): itching   Spironolactone     Other reaction(s): arf Other reaction(s): arf Other reaction(s): arf Other reaction(s): arf    EKGs/Labs/Other Studies Reviewed:   The following studies were reviewed today: Cardiac Studies & Procedures   CARDIAC CATHETERIZATION  CARDIAC CATHETERIZATION 06/01/2018  Narrative  Prox LAD lesion is 25% stenosed.  Ost 1st Diag lesion is 25% stenosed.  Mid LM lesion is 10% stenosed.  LV end diastolic pressure is  mildly elevated. LVEDP 17 mm Hg.  There is no aortic valve stenosis.  Hemodynamic findings consistent  with mild pulmonary hypertension.  Ao sat 98%, PA sat 66%, CO 4.79 L/min; CI 2.2, mean PA 30 mm Hg; mean PCWP 22 mm Hg  No significant CAD.  Medical therapy for LV dysfunction.  Findings Coronary Findings Diagnostic  Dominance: Right  Left Main Mid LM lesion is 10% stenosed.  Left Anterior Descending Prox LAD lesion is 25% stenosed.  First Diagonal Branch Ost 1st Diag lesion is 25% stenosed.  Intervention  No interventions have been documented.   CARDIAC CATHETERIZATION  CARDIAC CATHETERIZATION 05/31/2018     ECHOCARDIOGRAM  ECHOCARDIOGRAM COMPLETE 11/22/2022  Narrative ECHOCARDIOGRAM REPORT    Patient Name:   Crystal [name] Date of Exam: 11/22/2022 Medical Rec #:  [id number]      Height:       64.0 in Accession #:    [id number]     Weight:       250.0 lb Date of Birth:  1951/04/09       BSA:          2.151 m Patient Age:    71 years       BP:           114/52 mmHg Patient Gender: F              HR:           71 bpm. Exam Location:  [address]  Procedure: 2D Echo, Cardiac Doppler, Color Doppler and Strain Analysis  Indications:    R06.00 SOB  History:        Patient has prior history of Echocardiogram examinations, most recent 12/13/2022. CHF, Previous Myocardial Infarction and CAD, Pacemaker, Arrythmias:Bradycardia and LBBB, Signs/Symptoms:Syncope and Dizziness/Lightheadedness; Risk Factors:Hypertension, Diabetes, Sleep Apnea and HLD.  Sonographer:    Clearence Ped RCS Referring Phys: [id number] [place] Murtha N Pastor Sgro  IMPRESSIONS   1. Left ventricular ejection fraction, by estimation, is 60 to 65%. The left ventricle has normal function. The left ventricle has no regional wall motion abnormalities. There is mild left ventricular hypertrophy. Left ventricular diastolic parameters are consistent with Grade I diastolic dysfunction (impaired relaxation). 2. Right  ventricular systolic function is normal. The right ventricular size is normal. There is normal pulmonary artery systolic pressure. The estimated right ventricular systolic pressure is 27.8 mmHg. 3. The mitral valve is normal in structure. Trivial mitral valve regurgitation. No evidence of mitral stenosis. 4. The aortic valve is grossly normal. There is mild thickening of the aortic valve. Aortic valve regurgitation is not visualized. No aortic stenosis is present. 5. The inferior vena cava is normal in size with greater than 50% respiratory variability, suggesting right atrial pressure of 3 mmHg.  FINDINGS Left Ventricle: Left ventricular ejection fraction, by estimation, is 60 to 65%. The left ventricle has normal function. The left ventricle has no regional wall motion abnormalities. Global longitudinal strain performed but not reported based on interpreter judgement due to suboptimal tracking. The left ventricular internal cavity size was normal in size. There is mild left ventricular hypertrophy. Left ventricular diastolic parameters are consistent with Grade I diastolic dysfunction (impaired relaxation).  Right Ventricle: The right ventricular size is normal. No increase in right ventricular wall thickness. Right ventricular systolic function is normal. There is normal pulmonary artery systolic pressure. The tricuspid regurgitant velocity is 2.49 m/s, and with an assumed right atrial pressure of 3 mmHg, the estimated right ventricular systolic pressure is 27.8 mmHg.  Left Atrium: Left atrial size was normal in size.  Right Atrium: Right atrial size was normal in  size.  Pericardium: There is no evidence of pericardial effusion. Presence of epicardial fat layer.  Mitral Valve: The mitral valve is normal in structure. Trivial mitral valve regurgitation. No evidence of mitral valve stenosis.  Tricuspid Valve: The tricuspid valve is normal in structure. Tricuspid valve regurgitation is trivial.  No evidence of tricuspid stenosis.  Aortic Valve: The aortic valve is grossly normal. There is mild thickening of the aortic valve. Aortic valve regurgitation is not visualized. No aortic stenosis is present.  Pulmonic Valve: The pulmonic valve was normal in structure. Pulmonic valve regurgitation is not visualized. No evidence of pulmonic stenosis.  Aorta: The aortic root is normal in size and structure.  Venous: The inferior vena cava is normal in size with greater than 50% respiratory variability, suggesting right atrial pressure of 3 mmHg.  IAS/Shunts: No atrial level shunt detected by color flow Doppler.  Additional Comments: A device lead is visualized in the right atrium and right ventricle.   LEFT VENTRICLE PLAX 2D LVIDd:         5.90 cm   Diastology LVIDs:         3.90 cm   LV e' medial:    6.5 cm/s LV PW:         1.10 cm   LV E/e' medial:  11 LV IVS:        0.90 cm   LV e' lateral:   10.20 cm/s LVOT diam:     2.10 cm   LV E/e' lateral: 6.9 LV SV:         85 LV SV Index:   40        2D Longitudinal Strain LVOT Area:     3.46 cm  2D Strain GLS (A2C):   -17.5 % 2D Strain GLS (A3C):   -14.2 % 2D Strain GLS (A4C):   -22.4 % 2D Strain GLS Avg:     -18.0 %  RIGHT VENTRICLE RV Basal diam:  3.40 cm RV S prime:     13.30 cm/s TAPSE (M-mode): 2.4 cm RVSP:           27.8 mmHg  LEFT ATRIUM             Index        RIGHT ATRIUM           Index LA diam:        4.60 cm 2.14 cm/m   RA Pressure: 3.00 mmHg LA Vol (A2C):   69.4 ml 32.26 ml/m  RA Area:     14.20 cm LA Vol (A4C):   70.2 ml 32.64 ml/m  RA Volume:   34.20 ml  15.90 ml/m LA Biplane Vol: 71.9 ml 33.43 ml/m AORTIC VALVE LVOT Vmax:   94.20 cm/s LVOT Vmean:  72.700 cm/s LVOT VTI:    0.246 m  AORTA Ao Root diam: 3.70 cm Ao Asc diam:  3.50 cm  MITRAL VALVE               TRICUSPID VALVE MV Area (PHT):             TR Peak grad:   24.8 mmHg MV Decel Time:             TR Vmax:        249.00 cm/s MV E velocity: 70.70  cm/s  Estimated RAP:  3.00 mmHg MV A velocity: 77.00 cm/s  RVSP:           27.8 mmHg MV E/A ratio:  0.92 SHUNTS  Systemic VTI:  0.25 m Systemic Diam: 2.10 cm  Weston Brass MD Electronically signed by Weston Brass MD Signature Date/Time: 11/22/2022/12:56:40 PM    Final              EKG:  EKG is not ordered today.    Recent Labs: 11/25/2022: BUN 53; Creatinine, Ser 2.52; Potassium 4.2; Sodium 139  Recent Lipid Panel    Component Value Date/Time   CHOL 186 11/22/2017 1507   TRIG 279 (H) 11/22/2017 1507   HDL 48 (L) 11/22/2017 1507   CHOLHDL 3.9 11/22/2017 1507   LDLCALC 98 11/22/2017 1507    Home Medications   No outpatient medications have been marked as taking for the 12/27/22 encounter (Appointment) with Sharlene Dory, PA-C.     Review of Systems      All other systems reviewed and are otherwise negative except as noted above.  Physical Exam    VS:  There were no vitals taken for this visit. , BMI There is no height or weight on file to calculate BMI.  Wt Readings from Last 3 Encounters:  12/26/22 255 lb (115.7 kg)  11/24/22 253 lb 12.8 oz (115.1 kg)  11/18/22 250 lb (113.4 kg)     GEN: Well nourished, well developed, in no acute distress. HEENT: normal. Neck: Supple, no JVD, carotid bruits, or masses. Cardiac: RRR, no murmurs, rubs, or gallops. No clubbing, cyanosis, Left sided no edema, none on right LE.  Radials/PT 2+ and equal bilaterally.  Respiratory:  Respirations regular and unlabored, clear to auscultation bilaterally. GI: Soft, nontender, nondistended. MS: No deformity or atrophy. Skin: Warm and dry, no rash. Neuro:  Strength and sensation are intact. Psych: Normal affect.  Assessment & Plan    NICM -euvolemic on exam today -SOB is better and weight has been up and down but usually around 250-255 lbs.  -She is taking Demedex three days a week and is on Farxiga 10 mg daily -No chest pain and no anginal equivalent pain ( jaw and neck  pain) -recent labs reviewed and her creatinine has been stable  CKD stage III -She saw her kidney doctor about a month ago and things have been stable. -Baseline creatinine is 2.8-2.6 -She has follow-up scheduled with her kidney doctor in July  Hyperlipidemia -Last LDL was 51 (01/11/2022) -We will need her PCP to update a lipid panel -For now, continue Crestor 5 mg daily  Diabetes -Last A1c was 8.5 (07/2022) -Continue to work with primary for better control  Low-grade GI bleed in the past -no recent bleeding    Disposition: Follow up 6 months with Lance Muss, MD or APP.  Signed, Sharlene Dory, PA-C 12/26/2022, 1:31 PM [facility]

## 2022-12-27 ENCOUNTER — Ambulatory Visit: Payer: Medicare Other | Attending: Physician Assistant | Admitting: Physician Assistant

## 2022-12-27 ENCOUNTER — Encounter: Payer: Self-pay | Admitting: Physician Assistant

## 2022-12-27 VITALS — BP 102/56 | HR 70 | Ht 64.0 in | Wt 250.0 lb

## 2022-12-27 DIAGNOSIS — I428 Other cardiomyopathies: Secondary | ICD-10-CM

## 2022-12-27 DIAGNOSIS — E1165 Type 2 diabetes mellitus with hyperglycemia: Secondary | ICD-10-CM

## 2022-12-27 DIAGNOSIS — R0602 Shortness of breath: Secondary | ICD-10-CM | POA: Diagnosis not present

## 2022-12-27 DIAGNOSIS — N1832 Chronic kidney disease, stage 3b: Secondary | ICD-10-CM

## 2022-12-27 DIAGNOSIS — E785 Hyperlipidemia, unspecified: Secondary | ICD-10-CM | POA: Diagnosis not present

## 2022-12-27 DIAGNOSIS — I447 Left bundle-branch block, unspecified: Secondary | ICD-10-CM | POA: Diagnosis not present

## 2022-12-27 DIAGNOSIS — Z794 Long term (current) use of insulin: Secondary | ICD-10-CM

## 2022-12-27 NOTE — Patient Instructions (Addendum)
Medication Instructions:  Your physician recommends that you continue on your current medications as directed. Please refer to the Current Medication list given to you today.  *If you need a refill on your cardiac medications before your next appointment, please call your pharmacy*  Lab Work: None ordered If you have labs (blood work) drawn today and your tests are completely normal, you will receive your results only by: MyChart Message (if you have MyChart) OR A paper copy in the mail If you have any lab test that is abnormal or we need to change your treatment, we will call you to review the results.  Follow-Up: At [facility], you and your health needs are our priority.  As part of our continuing mission to provide you with exceptional heart care, we have created designated Provider Care Teams.  These Care Teams include your primary Cardiologist (physician) and Advanced Practice Providers (APPs -  Physician Assistants and Nurse Practitioners) who all work together to provide you with the care you need, when you need it.  Your next appointment:   6 month(s)  Provider:   Lance Muss, MD   Other Instructions 1.You have been referred to Dr Delton Coombes. 2.Weigh every morning after using the restroom, before breakfast and call and let us know if you have a weight gain of 2 lbs or more overnight or 5 lbs or more in a week.    Low-Sodium Eating Plan Salt (sodium) helps you keep a healthy balance of fluids in your body. Too much sodium can raise your blood pressure. It can also cause fluid and waste to be held in your body. Your health care provider or dietitian may recommend a low-sodium eating plan if you have high blood pressure (hypertension), kidney disease, liver disease, or heart failure. Eating less sodium can help lower your blood pressure and reduce swelling. It can also protect your heart, liver, and kidneys. What are tips for following this plan? Reading food  labels  Check food labels for the amount of sodium per serving. If you eat more than one serving, you must multiply the listed amount by the number of servings. Choose foods with less than 140 milligrams (mg) of sodium per serving. Avoid foods with 300 mg of sodium or more per serving. Always check how much sodium is in a product, even if the label says "unsalted" or "no salt added." Shopping  Buy products labeled as "low-sodium" or "no salt added." Buy fresh foods. Avoid canned foods and pre-made or frozen meals. Avoid canned, cured, or processed meats. Buy breads that have less than 80 mg of sodium per slice. Cooking  Eat more home-cooked food. Try to eat less restaurant, buffet, and fast food. Try not to add salt when you cook. Use salt-free seasonings or herbs instead of table salt or sea salt. Check with your provider or pharmacist before using salt substitutes. Cook with plant-based oils, such as canola, sunflower, or olive oil. Meal planning When eating at a restaurant, ask if your food can be made with less salt or no salt. Avoid dishes labeled as brined, pickled, cured, or smoked. Avoid dishes made with soy sauce, miso, or teriyaki sauce. Avoid foods that have monosodium glutamate (MSG) in them. MSG may be added to some restaurant food, sauces, soups, bouillon, and canned foods. Make meals that can be grilled, baked, poached, roasted, or steamed. These are often made with less sodium. General information Try to limit your sodium intake to 1,500-2,300 mg each day, or the  amount told by your provider. What foods should I eat? Fruits Fresh, frozen, or canned fruit. Fruit juice. Vegetables Fresh or frozen vegetables. "No salt added" canned vegetables. "No salt added" tomato sauce and paste. Low-sodium or reduced-sodium tomato and vegetable juice. Grains Low-sodium cereals, such as oats, puffed wheat and rice, and shredded wheat. Low-sodium crackers. Unsalted rice. Unsalted pasta.  Low-sodium bread. Whole grain breads and whole grain pasta. Meats and other proteins Fresh or frozen meat, poultry, seafood, and fish. These should have no added salt. Low-sodium canned tuna and salmon. Unsalted nuts. Dried peas, beans, and lentils without added salt. Unsalted canned beans. Eggs. Unsalted nut butters. Dairy Milk. Soy milk. Cheese that is naturally low in sodium, such as ricotta cheese, fresh mozzarella, or Swiss cheese. Low-sodium or reduced-sodium cheese. Cream cheese. Yogurt. Seasonings and condiments Fresh and dried herbs and spices. Salt-free seasonings. Low-sodium mustard and ketchup. Sodium-free salad dressing. Sodium-free light mayonnaise. Fresh or refrigerated horseradish. Lemon juice. Vinegar. Other foods Homemade, reduced-sodium, or low-sodium soups. Unsalted popcorn and pretzels. Low-salt or salt-free chips. The items listed above may not be all the foods and drinks you can have. Talk to a dietitian to learn more. What foods should I avoid? Vegetables Sauerkraut, pickled vegetables, and relishes. Olives. [place] fries. Onion rings. Regular canned vegetables, except low-sodium or reduced-sodium items. Regular canned tomato sauce and paste. Regular tomato and vegetable juice. Frozen vegetables in sauces. Grains Instant hot cereals. Bread stuffing, pancake, and biscuit mixes. Croutons. Seasoned rice or pasta mixes. Noodle soup cups. Boxed or frozen macaroni and cheese. Regular salted crackers. Self-rising flour. Meats and other proteins Meat or fish that is salted, canned, smoked, spiced, or pickled. Precooked or cured meat, such as sausages or meat loaves. Tomasa Blase. Ham. Pepperoni. Hot dogs. Corned beef. Chipped beef. Salt pork. Jerky. Pickled herring, anchovies, and sardines. Regular canned tuna. Salted nuts. Dairy Processed cheese and cheese spreads. Hard cheeses. Cheese curds. Blue cheese. Feta cheese. String cheese. Regular cottage cheese. Buttermilk. Canned milk. Fats  and oils Salted butter. Regular margarine. Ghee. Bacon fat. Seasonings and condiments Onion salt, garlic salt, seasoned salt, table salt, and sea salt. Canned and packaged gravies. Worcestershire sauce. Tartar sauce. Barbecue sauce. Teriyaki sauce. Soy sauce, including reduced-sodium soy sauce. Steak sauce. Fish sauce. Oyster sauce. Cocktail sauce. Horseradish that you find on the shelf. Regular ketchup and mustard. Meat flavorings and tenderizers. Bouillon cubes. Hot sauce. Pre-made or packaged marinades. Pre-made or packaged taco seasonings. Relishes. Regular salad dressings. Salsa. Other foods Salted popcorn and pretzels. Corn chips and puffs. Potato and tortilla chips. Canned or dried soups. Pizza. Frozen entrees and pot pies. The items listed above may not be all the foods and drinks you should avoid. Talk to a dietitian to learn more. This information is not intended to replace advice given to you by your health care provider. Make sure you discuss any questions you have with your health care provider. Document Revised: 07/28/2022 Document Reviewed: 07/28/2022 Elsevier Patient Education  2024 [employer]  Heart-Healthy Eating Plan Many factors influence your heart health, including eating and exercise habits. Heart health is also called coronary health. Coronary risk increases with abnormal blood fat (lipid) levels. A heart-healthy eating plan includes limiting unhealthy fats, increasing healthy fats, limiting salt (sodium) intake, and making other diet and lifestyle changes. What is my plan? Your health care provider may recommend that: You limit your fat intake to _________% or less of your total calories each day. You limit your saturated fat intake to _________%  or less of your total calories each day. You limit the amount of cholesterol in your diet to less than _________ mg per day. You limit the amount of sodium in your diet to less than _________ mg per day. What are tips for  following this plan? Cooking Cook foods using methods other than frying. Baking, boiling, grilling, and broiling are all good options. Other ways to reduce fat include: Removing the skin from poultry. Removing all visible fats from meats. Steaming vegetables in water or broth. Meal planning  At meals, imagine dividing your plate into fourths: Fill one-half of your plate with vegetables and green salads. Fill one-fourth of your plate with whole grains. Fill one-fourth of your plate with lean protein foods. Eat 2-4 cups of vegetables per day. One cup of vegetables equals 1 cup (91 g) broccoli or cauliflower florets, 2 medium carrots, 1 large bell pepper, 1 large sweet potato, 1 large tomato, 1 medium white potato, 2 cups (150 g) raw leafy greens. Eat 1-2 cups of fruit per day. One cup of fruit equals 1 small apple, 1 large banana, 1 cup (237 g) mixed fruit, 1 large orange,  cup (82 g) dried fruit, 1 cup (240 mL) 100% fruit juice. Eat more foods that contain soluble fiber. Examples include apples, broccoli, carrots, beans, peas, and barley. Aim to get 25-30 g of fiber per day. Increase your consumption of legumes, nuts, and seeds to 4-5 servings per week. One serving of dried beans or legumes equals  cup (90 g) cooked, 1 serving of nuts is  oz (12 almonds, 24 pistachios, or 7 walnut halves), and 1 serving of seeds equals  oz (8 g). Fats Choose healthy fats more often. Choose monounsaturated and polyunsaturated fats, such as olive and canola oils, avocado oil, flaxseeds, walnuts, almonds, and seeds. Eat more omega-3 fats. Choose salmon, mackerel, sardines, tuna, flaxseed oil, and ground flaxseeds. Aim to eat fish at least 2 times each week. Check food labels carefully to identify foods with trans fats or high amounts of saturated fat. Limit saturated fats. These are found in animal products, such as meats, butter, and cream. Plant sources of saturated fats include palm oil, palm kernel oil,  and coconut oil. Avoid foods with partially hydrogenated oils in them. These contain trans fats. Examples are stick margarine, some tub margarines, cookies, crackers, and other baked goods. Avoid fried foods. General information Eat more home-cooked food and less restaurant, buffet, and fast food. Limit or avoid alcohol. Limit foods that are high in added sugar and simple starches such as foods made using white refined flour (white breads, pastries, sweets). Lose weight if you are overweight. Losing just 5-10% of your body weight can help your overall health and prevent diseases such as diabetes and heart disease. Monitor your sodium intake, especially if you have high blood pressure. Talk with your health care provider about your sodium intake. Try to incorporate more vegetarian meals weekly. What foods should I eat? Fruits All fresh, canned (in natural juice), or frozen fruits. Vegetables Fresh or frozen vegetables (raw, steamed, roasted, or grilled). Green salads. Grains Most grains. Choose whole wheat and whole grains most of the time. Rice and pasta, including brown rice and pastas made with whole wheat. Meats and other proteins Lean, well-trimmed beef, veal, pork, and lamb. Chicken and [place] without skin. All fish and shellfish. Wild duck, rabbit, pheasant, and venison. Egg whites or low-cholesterol egg substitutes. Dried beans, peas, lentils, and tofu. Seeds and most nuts. Dairy Low-fat  or nonfat cheeses, including ricotta and mozzarella. Skim or 1% milk (liquid, powdered, or evaporated). Buttermilk made with low-fat milk. Nonfat or low-fat yogurt. Fats and oils Non-hydrogenated (trans-free) margarines. Vegetable oils, including soybean, sesame, sunflower, olive, avocado, peanut, safflower, corn, canola, and cottonseed. Salad dressings or mayonnaise made with a vegetable oil. Beverages Water (mineral or sparkling). Coffee and tea. Unsweetened ice tea. Diet beverages. Sweets and  desserts Sherbet, gelatin, and fruit ice. Small amounts of dark chocolate. Limit all sweets and desserts. Seasonings and condiments All seasonings and condiments. The items listed above may not be a complete list of foods and beverages you can eat. Contact a dietitian for more options. What foods should I avoid? Fruits Canned fruit in heavy syrup. Fruit in cream or butter sauce. Fried fruit. Limit coconut. Vegetables Vegetables cooked in cheese, cream, or butter sauce. Fried vegetables. Grains Breads made with saturated or trans fats, oils, or whole milk. Croissants. Sweet rolls. Donuts. High-fat crackers, such as cheese crackers and chips. Meats and other proteins Fatty meats, such as hot dogs, ribs, sausage, bacon, rib-eye roast or steak. High-fat deli meats, such as salami and bologna. Caviar. Domestic duck and goose. Organ meats, such as liver. Dairy Cream, sour cream, cream cheese, and creamed cottage cheese. Whole-milk cheeses. Whole or 2% milk (liquid, evaporated, or condensed). Whole buttermilk. Cream sauce or high-fat cheese sauce. Whole-milk yogurt. Fats and oils Meat fat, or shortening. Cocoa butter, hydrogenated oils, palm oil, coconut oil, palm kernel oil. Solid fats and shortenings, including bacon fat, salt pork, lard, and butter. Nondairy cream substitutes. Salad dressings with cheese or sour cream. Beverages Regular sodas and any drinks with added sugar. Sweets and desserts Frosting. Pudding. Cookies. Cakes. Pies. Milk chocolate or white chocolate. Buttered syrups. Full-fat ice cream or ice cream drinks. The items listed above may not be a complete list of foods and beverages to avoid. Contact a dietitian for more information. Summary Heart-healthy meal planning includes limiting unhealthy fats, increasing healthy fats, limiting salt (sodium) intake and making other diet and lifestyle changes. Lose weight if you are overweight. Losing just 5-10% of your body weight can help  your overall health and prevent diseases such as diabetes and heart disease. Focus on eating a balance of foods, including fruits and vegetables, low-fat or nonfat dairy, lean protein, nuts and legumes, whole grains, and heart-healthy oils and fats. This information is not intended to replace advice given to you by your health care provider. Make sure you discuss any questions you have with your health care provider. Document Revised: 08/16/2021 Document Reviewed: 08/16/2021 Elsevier Patient Education  2024 [employer].

## 2022-12-29 ENCOUNTER — Ambulatory Visit: Payer: Self-pay

## 2022-12-29 NOTE — Patient Outreach (Signed)
  Care Coordination   12/29/2022 Name: Crystal [name] MRN: [id number] DOB: 1950-11-17   Care Coordination Outreach Attempts:  An unsuccessful telephone outreach was attempted for a scheduled appointment today.  Follow Up Plan:  Additional outreach attempts will be made to offer the patient care coordination information and services.   Encounter Outcome:  Pt. Request to Call Back   Care Coordination Interventions:  No, not indicated    Delsa Sale, RN, BSN, CCM Care Management Coordinator [facility] Care Management  Direct Phone: [phone]

## 2023-01-02 ENCOUNTER — Ambulatory Visit: Payer: Self-pay

## 2023-01-02 NOTE — Patient Instructions (Signed)
Visit Information  Thank you for taking time to visit with me today. Please don't hesitate to contact me if I can be of assistance to you.   Following are the goals we discussed today:   Goals Addressed               This Visit's Progress     Patient Stated     I would like to continue to improve my diabetes (pt-stated)        Care Coordination Interventions: Evaluation of current treatment plan related to type 2 diabetes and patient's adherence to plan as established by provider Review of patient status, including review of consultants reports, relevant laboratory and other test results, and medications completed Determined patient continues to take Farxiga 10 mg daily as directed Discussed with patient she feels her daily sugars are improving since starting this medication  Reinforced importance of adhering to low carb diet and establishing a routine exercise regimen Determined patient received a call from PREP nurse and plans to start in July once a class time is available Positive reinforcement provided to patient for making efforts to improve her diabetes and overall health  Lab Results  Component Value Date   HGBA1C 8.5 (H) 10/27/2021           Other     To better manage swelling and shortness of breath        Care Coordination Interventions: Evaluation of current treatment plan related to nonischemic cardiomyopathy and patient's adherence to plan as established by provider Reviewed role of diuretics in prevention of fluid overload and management of heart failure; Discussed the importance of keeping all appointments with provider Review of patient status, including review of consultant's reports, relevant laboratory and other test results, and medications completed Reviewed and discussed MD referral to Pulmonology to further evaluate shortness of breath, educated patient about the referral process and next steps Scheduled RN CC follow up call to ensure patient has  received call/appointment with Pulmonology as recommended         Our next appointment is by telephone on 01/17/23 at 11:00 AM  Please call the care guide team at [phone] if you need to cancel or reschedule your appointment.   If you are experiencing a Mental Health or Behavioral Health Crisis or need someone to talk to, please call 1-800-273-TALK (toll free, 24 hour hotline)  Patient verbalizes understanding of instructions and care plan provided today and agrees to view in MyChart. Active MyChart status and patient understanding of how to access instructions and care plan via MyChart confirmed with patient.     Delsa Sale, RN, BSN, CCM Care Management Coordinator [facility] Care Management Direct Phone: [phone]

## 2023-01-02 NOTE — Patient Outreach (Signed)
  Care Coordination   Follow Up Visit Note   01/02/2023 Name: Crystal [name] MRN: [id number] DOB: [birth year]  Crystal [name] is a 72 y.o. year old female who sees Rodrigo Ran, MD for primary care. I spoke with  Crystal [name] by phone today.  What matters to the patients health and wellness today?  Patient would like to have her shortness of breath evaluated by a lung specialist. She would like to participate in PREP once a class time is available.     Goals Addressed               This Visit's Progress     Patient Stated     I would like to continue to improve my diabetes (pt-stated)        Care Coordination Interventions: Evaluation of current treatment plan related to type 2 diabetes and patient's adherence to plan as established by provider Review of patient status, including review of consultants reports, relevant laboratory and other test results, and medications completed Determined patient continues to take Farxiga 10 mg daily as directed Discussed with patient she feels her daily sugars are improving since starting this medication  Reinforced importance of adhering to low carb diet and establishing a routine exercise regimen Determined patient received a call from PREP nurse and plans to start in July once a class time is available Positive reinforcement provided to patient for making efforts to improve her diabetes and overall health  Lab Results  Component Value Date   HGBA1C 8.5 (H) 10/27/2021           Other     To better manage swelling and shortness of breath        Care Coordination Interventions: Evaluation of current treatment plan related to nonischemic cardiomyopathy and patient's adherence to plan as established by provider Reviewed role of diuretics in prevention of fluid overload and management of heart failure; Discussed the importance of keeping all appointments with provider Review of patient status, including review of consultant's reports,  relevant laboratory and other test results, and medications completed Reviewed and discussed MD referral to Pulmonology to further evaluate shortness of breath, educated patient about the referral process and next steps Scheduled RN CC follow up call to ensure patient has received call/appointment with Pulmonology as recommended     Interventions Today    Flowsheet Row Most Recent Value  Chronic Disease   Chronic disease during today's visit Congestive Heart Failure (CHF), Diabetes  General Interventions   General Interventions Discussed/Reviewed General Interventions Discussed, General Interventions Reviewed, Doctor Visits  Doctor Visits Discussed/Reviewed Doctor Visits Discussed, Doctor Visits Reviewed, Specialist  Exercise Interventions   Exercise Discussed/Reviewed Physical Activity, Exercise Reviewed, Exercise Discussed  Physical Activity Discussed/Reviewed Physical Activity Discussed, Physical Activity Reviewed, Types of exercise, PREP  Education Interventions   Education Provided Provided Education  Provided Verbal Education On Labs, Blood Sugar Monitoring, Exercise, Medication, When to see the doctor  Labs Reviewed Hgb A1c, Kidney Function  Pharmacy Interventions   Pharmacy Dicussed/Reviewed Medications and their functions, Pharmacy Topics Discussed, Pharmacy Topics Reviewed          SDOH assessments and interventions completed:  No     Care Coordination Interventions:  Yes, provided   Follow up plan: Follow up call scheduled for 01/17/23 @11 :30 AM    Encounter Outcome:  Pt. Visit Completed

## 2023-01-10 NOTE — Progress Notes (Signed)
Remote ICD transmission.   

## 2023-01-23 ENCOUNTER — Encounter: Payer: Medicare Other | Attending: Registered Nurse | Admitting: Registered Nurse

## 2023-01-23 ENCOUNTER — Encounter: Payer: Self-pay | Admitting: Registered Nurse

## 2023-01-23 VITALS — BP 117/64 | HR 73 | Ht 64.0 in | Wt 249.0 lb

## 2023-01-23 DIAGNOSIS — M170 Bilateral primary osteoarthritis of knee: Secondary | ICD-10-CM | POA: Insufficient documentation

## 2023-01-23 DIAGNOSIS — M2550 Pain in unspecified joint: Secondary | ICD-10-CM | POA: Diagnosis not present

## 2023-01-23 DIAGNOSIS — M545 Low back pain, unspecified: Secondary | ICD-10-CM | POA: Insufficient documentation

## 2023-01-23 DIAGNOSIS — Z5181 Encounter for therapeutic drug level monitoring: Secondary | ICD-10-CM | POA: Insufficient documentation

## 2023-01-23 DIAGNOSIS — I129 Hypertensive chronic kidney disease with stage 1 through stage 4 chronic kidney disease, or unspecified chronic kidney disease: Secondary | ICD-10-CM | POA: Diagnosis not present

## 2023-01-23 DIAGNOSIS — G8929 Other chronic pain: Secondary | ICD-10-CM | POA: Diagnosis not present

## 2023-01-23 DIAGNOSIS — N2581 Secondary hyperparathyroidism of renal origin: Secondary | ICD-10-CM | POA: Diagnosis not present

## 2023-01-23 DIAGNOSIS — Z79891 Long term (current) use of opiate analgesic: Secondary | ICD-10-CM | POA: Insufficient documentation

## 2023-01-23 DIAGNOSIS — G894 Chronic pain syndrome: Secondary | ICD-10-CM | POA: Diagnosis not present

## 2023-01-23 DIAGNOSIS — E1122 Type 2 diabetes mellitus with diabetic chronic kidney disease: Secondary | ICD-10-CM | POA: Diagnosis not present

## 2023-01-23 DIAGNOSIS — N184 Chronic kidney disease, stage 4 (severe): Secondary | ICD-10-CM | POA: Diagnosis not present

## 2023-01-23 DIAGNOSIS — D509 Iron deficiency anemia, unspecified: Secondary | ICD-10-CM | POA: Diagnosis not present

## 2023-01-23 DIAGNOSIS — I5042 Chronic combined systolic (congestive) and diastolic (congestive) heart failure: Secondary | ICD-10-CM | POA: Diagnosis not present

## 2023-01-23 MED ORDER — OXYCODONE-ACETAMINOPHEN 7.5-325 MG PO TABS: 1.0000 | ORAL_TABLET | Freq: Three times a day (TID) | ORAL | 0 refills | Status: DC | PRN

## 2023-01-23 NOTE — Progress Notes (Signed)
Subjective:    Patient ID: Crystal [name], female    DOB: 1951/06/14, 72 y.o.   MRN: [id number]  HPI: Crystal [name] is a 72 y.o. female who returns for follow up appointment for chronic pain and medication refill. She states her pain is located in her lower back pain, bilateral knee pain and generalized joint pain. She rates her pain 6. Her current exercise regime is walking with walker  and performing stretching exercises.  Crystal [name] is 33.75 MME.   Last UDS was Performed on 09/27/2022, it was consistent.      Pain Inventory Average Pain 9 Pain Right Now 6 My pain is intermittent, constant, sharp, burning, stabbing, and aching  In the last 24 hours, has pain interfered with the following? General activity 7 Relation with others 7 Enjoyment of life 7 What TIME of day is your pain at its worst? morning , daytime, evening, and night Sleep (in general) Good  Pain is worse with: walking, bending, standing, and some activites Pain improves with: rest, medication, and injections Relief from Meds: 9  Family History  Problem Relation Age of Onset   Hypertension Father    Heart disease Father    Cancer Father        prostate   Heart disease Other        (Maternal side) Ischemic heart disease   Diabetes Mellitus II Other    Arrhythmia Mother    Diabetes Mellitus II Mother        Borderline DM   Hypertension Mother    Asthma Mother    Cancer Brother    Dementia Brother    Hypertension Brother    Hypertension Daughter    Hypertension Daughter    Diabetes Daughter    Hypertension Daughter    Atrial fibrillation Daughter    GER disease Daughter    Hypertension Son    Anxiety disorder Son    Hypothyroidism Son    Breast cancer Maternal Grandmother        in her 107's   Social History   Socioeconomic History   Marital status: Widowed    Spouse name: Not on file   Number of children: Not on file   Years of education: Not on file   Highest education  level: Not on file  Occupational History    Comment: retired LPN  Tobacco Use   Smoking status: Never   Smokeless tobacco: Never  Vaping Use   Vaping Use: Never used  Substance and Sexual Activity   Alcohol use: Not Currently   Drug use: Never   Sexual activity: Not Currently  Other Topics Concern   Not on file  Social History Narrative   [place].  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      As of 03/15/17:   Diet: 1800 Calorie      Caffeine: Yes      Married, if yes what year: Widowed, married 1973      Do you live in a house, apartment, assisted living, [place], trailer, ect: House, 1 stories, and 1 person      Pets: No      Current/Past profession: LPN, retired       Exercise: Yes, walking          Living Will: Yes   DNR: No   POA/HPOA: Yes      Functional Status:   Do  you have difficulty bathing or dressing yourself? No   Do you have difficulty preparing food or eating? No   Do you have difficulty managing your medications? No   Do you have difficulty managing your finances? No   Do you have difficulty affording your medications? Yes   Social Determinants of Health   Financial Resource Strain: Low Risk  (10/11/2017)   Overall Financial Resource Strain (CARDIA)    Difficulty of Paying Living Expenses: Not hard at all  Food Insecurity: No Food Insecurity (01/03/2022)   Hunger Vital Sign    Worried About Running Out of Food in the Last Year: Never true    Ran Out of Food in the Last Year: Never true  Transportation Needs: No Transportation Needs (01/03/2022)   PRAPARE - Administrator, Civil Service (Medical): No    Lack of Transportation (Non-Medical): No  Physical Activity: Inactive (10/11/2017)   Exercise Vital Sign    Days of Exercise per Week: 0 days    Minutes of Exercise per Session: 0 min  Stress: Stress Concern Present (10/11/2017)   [name]  - Occupational Stress Questionnaire    Feeling of Stress : To some extent  Social Connections: Somewhat Isolated (10/11/2017)   Social Connection and Isolation Panel [NHANES]    Frequency of Communication with Friends and Family: More than three times a week    Frequency of Social Gatherings with Friends and Family: More than three times a week    Attends Religious Services: More than 4 times per year    Active Member of [employer] or Organizations: No    Attends Banker Meetings: Never    Marital Status: Widowed   Past Surgical History:  Procedure Laterality Date   APPENDECTOMY  07/13/2017   BLADDER SUSPENSION  1990   "w/hysterectomy"   BOWEL RESECTION N/A 07/09/2018   Procedure: SMALL BOWEL RESECTION;  Surgeon: Jimmye Norman, MD;  [facility]: [facility];  Service: General;  Laterality: N/A;   CARDIAC CATHETERIZATION  2005   no obstructive CAD per patient   CARDIAC CATHETERIZATION  2018   CARDIAC DEFIBRILLATOR PLACEMENT  2005   BiV ICD implanted,  LV lead is an epicardial lead   CARPAL TUNNEL RELEASE Left 07/25/2014   Dr.Williamson    CARPAL TUNNEL RELEASE Right 04/08/2019   Procedure: RIGHT CARPAL TUNNEL RELEASE ENDOSCOPIC;  Surgeon: Mack Hook, MD;  Location[facility];  Service: Orthopedics;  Laterality: Right;   CATARACT EXTRACTION W/ INTRAOCULAR LENS  IMPLANT, BILATERAL Bilateral    COLONIC STENT PLACEMENT N/A 08/31/2017   Procedure: COLONIC STENT PLACEMENT;  Surgeon: Jeani Hawking, MD;  Location: [facility] ENDOSCOPY;  Service: Endoscopy;  Laterality: N/A;   COLONOSCOPY     2010-2011 Dr.Kipreos    COLOSTOMY  12/2016   Hattie Perch 01/26/2017   COLOSTOMY REVERSAL N/A 07/13/2017   Procedure: COLOSTOMY REVERSAL;  Surgeon: Jimmye Norman, MD;  [facility];  Service: General;  Laterality: N/A;   CYST REMOVAL HAND Right 05/2013   thumb   DILATION AND CURETTAGE OF UTERUS  X 5-6   ESOPHAGOGASTRODUODENOSCOPY (EGD) WITH PROPOFOL N/A 03/13/2020   Procedure: ESOPHAGOGASTRODUODENOSCOPY (EGD) WITH  PROPOFOL;  Surgeon: Jeani Hawking, MD;  Location: [facility] ENDOSCOPY;  Service: Endoscopy;  Laterality: N/A;   EXCISION MASS ABDOMINAL N/A 07/09/2018   Procedure: EXPLORATION OF  ABDOMINAL WOUND ERAS PATHWAY;  Surgeon: Jimmye Norman, MD;  Location: [facility] OR;  Service: General;  Laterality: N/A;   FLEXIBLE SIGMOIDOSCOPY N/A 08/24/2017   Procedure: Wenda Low  SIGMOIDOSCOPY;  Surgeon: Jeani Hawking, MD;  Location: [facility] ENDOSCOPY;  Service: Endoscopy;  Laterality: N/A;   FLEXIBLE SIGMOIDOSCOPY N/A 08/31/2017   Procedure: FLEXIBLE SIGMOIDOSCOPY;  Surgeon: Jeani Hawking, MD;  Location: [facility] ENDOSCOPY;  Service: Endoscopy;  Laterality: N/A;  stent placement   FLEXIBLE SIGMOIDOSCOPY N/A 03/13/2020   Procedure: FLEXIBLE SIGMOIDOSCOPY;  Surgeon: Jeani Hawking, MD;  [facility] ENDOSCOPY;  Service: Endoscopy;  Laterality: N/A;   FLEXIBLE SIGMOIDOSCOPY N/A 12/18/2020   Procedure: FLEXIBLE SIGMOIDOSCOPY;  Surgeon: Jeani Hawking, MD;  Location: [facility] ENDOSCOPY;  Service: Endoscopy;  Laterality: N/A;   HEMOSTASIS CLIP PLACEMENT  03/13/2020   Procedure: HEMOSTASIS CLIP PLACEMENT;  Surgeon: Jeani Hawking, MD;  Location: [facility] ENDOSCOPY;  Service: Endoscopy;;   HEMOSTASIS CLIP PLACEMENT  12/18/2020   Procedure: HEMOSTASIS CLIP PLACEMENT;  Surgeon: Jeani Hawking, MD;  [facility] ENDOSCOPY;  Service: Endoscopy;;   HOT HEMOSTASIS N/A 03/13/2020   Procedure: HOT HEMOSTASIS (ARGON PLASMA COAGULATION/BICAP);  Surgeon: Jeani Hawking, MD;  Location: [facility] ENDOSCOPY;  Service: Endoscopy;  Laterality: N/A;   HOT HEMOSTASIS N/A 12/18/2020   Procedure: HOT HEMOSTASIS (ARGON PLASMA COAGULATION/BICAP);  Surgeon: Jeani Hawking, MD;  Location: [facility] ENDOSCOPY;  Service: Endoscopy;  Laterality: N/A;   IMPLANTABLE CARDIOVERTER DEFIBRILLATOR GENERATOR CHANGE  2008   INSERTION OF MESH N/A 07/09/2018   Procedure: INSERTION OF VICRYL MESH;  Surgeon: Jimmye Norman, MD;  Location: [facility] OR;  Service: General;  Laterality: N/A;   LAPAROTOMY N/A 09/18/2017   Procedure:  EXPLORATORY LAPAROTOMY;  Surgeon: Jimmye Norman, MD;  Location: [facility] OR;  Service: General;  Laterality: N/A;   LEAD REVISION  06/22/2018   LEAD REVISION/REPAIR N/A 06/22/2018   Procedure: LEAD REVISION/REPAIR;  Surgeon: Duke Salvia, MD;  Location: [facility] INVASIVE CV LAB;  Service: Cardiovascular;  Laterality: N/A;   LYSIS OF ADHESION N/A 09/18/2017   Procedure: LYSIS OF ADHESION;  Surgeon: Jimmye Norman, MD;  Location: [facility] OR;  Service: General;  Laterality: N/A;   LYSIS OF ADHESION N/A 07/09/2018   Procedure: LYSIS OF ADHESION;  Surgeon: Jimmye Norman, MD;  Location: [facility] OR;  Service: General;  Laterality: N/A;   PARTIAL COLECTOMY N/A 09/18/2017   Procedure: ILEOCOLECTOMY;  Surgeon: Jimmye Norman, MD;  Location: [facility] OR;  Service: General;  Laterality: N/A;   PARTIAL COLECTOMY  09/25/2017   POLYPECTOMY  03/13/2020   Procedure: POLYPECTOMY;  Surgeon: Jeani Hawking, MD;  [facility] ENDOSCOPY;  Service: Endoscopy;;   RIGHT/LEFT HEART CATH AND CORONARY ANGIOGRAPHY N/A 06/01/2018   Procedure: RIGHT/LEFT HEART CATH AND CORONARY ANGIOGRAPHY;  Surgeon: Corky Crafts, MD;  Location: [facility] INVASIVE CV LAB;  Service: Cardiovascular;  Laterality: N/A;   SIGMOIDOSCOPY N/A 09/18/2017   Procedure: SIGMOIDOSCOPY;  Surgeon: Jimmye Norman, MD;  [facility];  Service: General;  Laterality: N/A;   SMALL INTESTINE SURGERY  07/09/2018   EXPLORATION OF  ABDOMINAL WOUND ERAS PATHWAYN/; MESH; LYSIS OF ADHESIONS   TOTAL ABDOMINAL HYSTERECTOMY  1990   TRANSFORAMINAL LUMBAR INTERBODY FUSION (TLIF) WITH PEDICLE SCREW FIXATION 2 LEVEL N/A 01/07/2020   Procedure: Lumbar Four-Five and Lumbar Five-Sacral One open lumbar decompression and transforaminal lumbar interbody fusion;  Surgeon: Jadene Pierini, MD;  Location: [facility] OR;  Service: Neurosurgery;  Laterality: N/A;  Lumbar Four-Five and Lumbar Five-Sacral One open lumbar decompression and transforaminal lumbar interbody fusion   TRIGGER FINGER RELEASE Right 05/213   TRIGGER  FINGER RELEASE Right 04/08/2019   Procedure: RIGHT INDEX RELEASE TRIGGER FINGER/A-1 PULLEY;  Surgeon: Mack Hook, MD;  Location: [facility] OR;  Service: Orthopedics;  Laterality: Right;   TUBAL LIGATION  1980s   VESICOVAGINAL FISTULA CLOSURE W/ TAH     Past Surgical History:  Procedure Laterality Date   APPENDECTOMY  07/13/2017   BLADDER SUSPENSION  1990   "w/hysterectomy"   BOWEL RESECTION N/A 07/09/2018   Procedure: SMALL BOWEL RESECTION;  Surgeon: Jimmye Norman, MD;  [facility];  Service: General;  Laterality: N/A;   CARDIAC CATHETERIZATION  2005   no obstructive CAD per patient   CARDIAC CATHETERIZATION  2018   CARDIAC DEFIBRILLATOR PLACEMENT  2005   BiV ICD implanted,  LV lead is an epicardial lead   CARPAL TUNNEL RELEASE Left 07/25/2014   Dr.Williamson    CARPAL TUNNEL RELEASE Right 04/08/2019   Procedure: RIGHT CARPAL TUNNEL RELEASE ENDOSCOPIC;  Surgeon: Mack Hook, MD;  Location: [facility] OR;  Service: Orthopedics;  Laterality: Right;   CATARACT EXTRACTION W/ INTRAOCULAR LENS  IMPLANT, BILATERAL Bilateral    COLONIC STENT PLACEMENT N/A 08/31/2017   Procedure: COLONIC STENT PLACEMENT;  Surgeon: Jeani Hawking, MD;  Location: [facility] ENDOSCOPY;  Service: Endoscopy;  Laterality: N/A;   COLONOSCOPY     2010-2011 Dr.Kipreos    COLOSTOMY  12/2016   Hattie Perch 01/26/2017   COLOSTOMY REVERSAL N/A 07/13/2017   Procedure: COLOSTOMY REVERSAL;  Surgeon: Jimmye Norman, MD;  Location[facility] OR;  Service: General;  Laterality: N/A;   CYST REMOVAL HAND Right 05/2013   thumb   DILATION AND CURETTAGE OF UTERUS  X 5-6   ESOPHAGOGASTRODUODENOSCOPY (EGD) WITH PROPOFOL N/A 03/13/2020   Procedure: ESOPHAGOGASTRODUODENOSCOPY (EGD) WITH PROPOFOL;  Surgeon: Jeani Hawking, MD;  Location[facility] ENDOSCOPY;  Service: Endoscopy;  Laterality: N/A;   EXCISION MASS ABDOMINAL N/A 07/09/2018   Procedure: EXPLORATION OF  ABDOMINAL WOUND ERAS PATHWAY;  Surgeon: Jimmye Norman, MD;  Location: [facility] OR;  Service: General;  Laterality: N/A;    FLEXIBLE SIGMOIDOSCOPY N/A 08/24/2017   Procedure: Arnell Sieving;  Surgeon: Jeani Hawking, MD;  Location: [facility] ENDOSCOPY;  Service: Endoscopy;  Laterality: N/A;   FLEXIBLE SIGMOIDOSCOPY N/A 08/31/2017   Procedure: FLEXIBLE SIGMOIDOSCOPY;  Surgeon: Jeani Hawking, MD;  Location: [facility] ENDOSCOPY;  Service: Endoscopy;  Laterality: N/A;  stent placement   FLEXIBLE SIGMOIDOSCOPY N/A 03/13/2020   Procedure: FLEXIBLE SIGMOIDOSCOPY;  Surgeon: Jeani Hawking, MD;  Location[facility] ENDOSCOPY;  Service: Endoscopy;  Laterality: N/A;   FLEXIBLE SIGMOIDOSCOPY N/A 12/18/2020   Procedure: FLEXIBLE SIGMOIDOSCOPY;  Surgeon: Jeani Hawking, MD;  Location: [facility] ENDOSCOPY;  Service: Endoscopy;  Laterality: N/A;   HEMOSTASIS CLIP PLACEMENT  03/13/2020   Procedure: HEMOSTASIS CLIP PLACEMENT;  Surgeon: Jeani Hawking, MD;  Location: [facility] ENDOSCOPY;  Service: Endoscopy;;   HEMOSTASIS CLIP PLACEMENT  12/18/2020   Procedure: HEMOSTASIS CLIP PLACEMENT;  Surgeon: Jeani Hawking, MD;  Location: [facility] ENDOSCOPY;  Service: Endoscopy;;   HOT HEMOSTASIS N/A 03/13/2020   Procedure: HOT HEMOSTASIS (ARGON PLASMA COAGULATION/BICAP);  Surgeon: Jeani Hawking, MD;  Location: [facility] ENDOSCOPY;  Service: Endoscopy;  Laterality: N/A;   HOT HEMOSTASIS N/A 12/18/2020   Procedure: HOT HEMOSTASIS (ARGON PLASMA COAGULATION/BICAP);  Surgeon: Jeani Hawking, MD;  Location: [facility] ENDOSCOPY;  Service: Endoscopy;  Laterality: N/A;   IMPLANTABLE CARDIOVERTER DEFIBRILLATOR GENERATOR CHANGE  2008   INSERTION OF MESH N/A 07/09/2018   Procedure: INSERTION OF VICRYL MESH;  Surgeon: Jimmye Norman, MD;  Location: [facility] OR;  Service: General;  Laterality: N/A;   LAPAROTOMY N/A 09/18/2017   Procedure: EXPLORATORY LAPAROTOMY;  Surgeon: Jimmye Norman, MD;  Location: [facility] OR;  Service: General;  Laterality: N/A;   LEAD REVISION  06/22/2018  LEAD REVISION/REPAIR N/A 06/22/2018   Procedure: LEAD REVISION/REPAIR;  Surgeon: Duke Salvia, MD;  Location: [facility] INVASIVE CV LAB;  Service:  Cardiovascular;  Laterality: N/A;   LYSIS OF ADHESION N/A 09/18/2017   Procedure: LYSIS OF ADHESION;  Surgeon: Jimmye Norman, MD;  Location: [facility] OR;  Service: General;  Laterality: N/A;   LYSIS OF ADHESION N/A 07/09/2018   Procedure: LYSIS OF ADHESION;  Surgeon: Jimmye Norman, MD;  Location: [facility] OR;  Service: General;  Laterality: N/A;   PARTIAL COLECTOMY N/A 09/18/2017   Procedure: ILEOCOLECTOMY;  Surgeon: Jimmye Norman, MD;  Location: [facility] OR;  Service: General;  Laterality: N/A;   PARTIAL COLECTOMY  09/25/2017   POLYPECTOMY  03/13/2020   Procedure: POLYPECTOMY;  Surgeon: Jeani Hawking, MD;  Location[facility] ENDOSCOPY;  Service: Endoscopy;;   RIGHT/LEFT HEART CATH AND CORONARY ANGIOGRAPHY N/A 06/01/2018   Procedure: RIGHT/LEFT HEART CATH AND CORONARY ANGIOGRAPHY;  Surgeon: Corky Crafts, MD;  Location: [facility] INVASIVE CV LAB;  Service: Cardiovascular;  Laterality: N/A;   SIGMOIDOSCOPY N/A 09/18/2017   Procedure: SIGMOIDOSCOPY;  Surgeon: Jimmye Norman, MD;  Location[facility] OR;  Service: General;  Laterality: N/A;   SMALL INTESTINE SURGERY  07/09/2018   EXPLORATION OF  ABDOMINAL WOUND ERAS PATHWAYN/; MESH; LYSIS OF ADHESIONS   TOTAL ABDOMINAL HYSTERECTOMY  1990   TRANSFORAMINAL LUMBAR INTERBODY FUSION (TLIF) WITH PEDICLE SCREW FIXATION 2 LEVEL N/A 01/07/2020   Procedure: Lumbar Four-Five and Lumbar Five-Sacral One open lumbar decompression and transforaminal lumbar interbody fusion;  Surgeon: Jadene Pierini, MD;  Location: [facility] OR;  Service: Neurosurgery;  Laterality: N/A;  Lumbar Four-Five and Lumbar Five-Sacral One open lumbar decompression and transforaminal lumbar interbody fusion   TRIGGER FINGER RELEASE Right 05/213   TRIGGER FINGER RELEASE Right 04/08/2019   Procedure: RIGHT INDEX RELEASE TRIGGER FINGER/A-1 PULLEY;  Surgeon: Mack Hook, MD;  Location: [facility] OR;  Service: Orthopedics;  Laterality: Right;   TUBAL LIGATION  1980s   VESICOVAGINAL FISTULA CLOSURE W/ TAH     Past Medical History:   Diagnosis Date   AICD (automatic cardioverter/defibrillator) present    high RV threshold chronically, device was turned off in 2014; Device battery has been dead x 7 years; "turned it back on 06/22/2018"   Anemia, iron deficiency    Angioedema    felt to likely be due to ace inhibitors but says she has had this even off of medicines, appears to be tolerating ARBs chronically   Anxiety    Arthritis    "hands, legs, arms; bad in my back" (06/22/2018)   Asthmatic bronchitis with status asthmaticus    Bradycardia    CHF (congestive heart failure) (HCC)    Chronic lower back pain    Chronic pain    CKD (chronic kidney disease), stage III (HCC)    Coronary artery disease    Mild nonobstructive (30% LAD, 30% RCA) by 09/01/16 cath at [facility]   Degenerative joint disease    Depression    Diabetes mellitus, type 2 (HCC)    Diabetic peripheral neuropathy (HCC)    Dizziness    Dyspnea on exertion    Family history of adverse reaction to anesthesia    Mother has nausea   GERD (gastroesophageal reflux disease)    Gout    Heart valve disorder    History of blood transfusion 1981; ~ 2005; 12/2016   "childbirth; defibrillator OR; colostomy OR"   History of mononucleosis 03/2014   Hyperlipidemia    Hypertension    Hypothyroid  Insomnia    Intervertebral disc degeneration    Ischemic cardiomyopathy    LBBB (left bundle branch block)    Myocardial infarction (HCC) dx'd ~ 2005   Nonischemic cardiomyopathy (HCC)    s/p ICD in 2005. EF has since recovered   Obesity    On home oxygen therapy    "2L at night" (06/22/2018)   OSA on CPAP    Pneumonia    "couple times; last time was 12/2016" (06/22/2018)   PONV (postoperative nausea and vomiting)    Presence of permanent cardiac pacemaker    [employer]   Swelling    Syncope    Systemic hypertension    Vitamin D deficiency    BP 117/64   Pulse 73   Ht 5\' 4"  (1.626 m)   Wt 249 lb (112.9 kg)   SpO2 95%   BMI 42.74  kg/m   Opioid Risk Score:   Fall Risk Score:  `1  Depression screen [facility] 2/9     01/23/2023   11:30 AM 10/26/2022   11:03 AM 09/27/2022   11:03 AM 08/02/2022   11:22 AM 07/19/2022   11:27 AM 06/21/2022   10:01 AM 03/08/2022   10:19 AM  Depression screen PHQ 2/9  Decreased Interest 0 0 0 0 0 0 0  Down, Depressed, Hopeless 0 0 0 0 0 0 0  PHQ - 2 Score 0 0 0 0 0 0 0    Review of Systems  Musculoskeletal:  Positive for back pain and gait problem.       Pain in both knees, both wrist  All other systems reviewed and are negative.      Objective:   Physical Exam Vitals and nursing note reviewed.  Constitutional:      Appearance: Normal appearance.  Cardiovascular:     Rate and Rhythm: Normal rate and regular rhythm.     Pulses: Normal pulses.     Heart sounds: Normal heart sounds.  Pulmonary:     Effort: Pulmonary effort is normal.     Breath sounds: Normal breath sounds.  Musculoskeletal:     Cervical back: Normal range of motion and neck supple.     Comments: Normal Muscle Bulk and Muscle Testing Reveals:  Upper Extremities: Full ROM and Muscle Strength 5/5   Lumbar Paraspinal Tenderness: L-4-L-5 Lower Extremities: Full ROM and Muscle Strength 5/5 Arises from Table slowly using walker for support Narrow Based  Gait     Skin:    General: Skin is warm and dry.  Neurological:     Mental Status: She is alert and oriented to person, place, and time.  Psychiatric:        Mood and Affect: Mood normal.        Behavior: Behavior normal.         Assessment & Plan:  Chronic Low Back Pain: Encouraged to increase HEP as Tolerated. Continue to Monitor. 01/23/2023 2. Bilateral Primary Osteoarthritis:  S/P Right Knee Genicular nerve radiofrequency neurotomy, with Dr Wynn Banker. On 08/02/2022 and S/P Left Knee Genicular Nerve Radiofrequency on 08/16/2022. With good relief noted.  : Indication Chronic post operative pain in the Knee, pain postop total knee replacement which has not  responded to conservative management such as physical therapy and medication management; Continue monitor. 01/23/2023 3. Myofascial Pain: S/P Trigger Point Injection on 06/29/2021 with Good Relief Noted. 01/23/2023 4. Chronic Pain Syndrome: Refilled: Oxycodone 7.5mg  /325 mg one tablet three times a day as needed for pain #90, Discontinue Hydrocodone  due to itching, Ms. Hemmelgarn verbalizes understanding.  We will continue the opioid monitoring program, this consists of regular clinic visits, examinations, urine drug screen, pill counts as well as use of [place] Controlled Substance Reporting system. A 12 month History has been reviewed on the [place] Controlled Substance Reporting System on 01/23/2023. 5. Polyarthralgia: Continue HEP as tolerated. Continue current medication regimen. Continue to monitor. 01/23/2023   F/U in 1 month

## 2023-01-24 ENCOUNTER — Ambulatory Visit: Payer: Self-pay

## 2023-01-24 LAB — LAB REPORT - SCANNED
Albumin, Urine POC: 4
Creatinine, POC: 1.92 mg/dL
EGFR: 27

## 2023-01-24 NOTE — Patient Instructions (Signed)
Visit Information  Thank you for taking time to visit with me today. Please don't hesitate to contact me if I can be of assistance to you.   Following are the goals we discussed today:   Goals Addressed             This Visit's Progress    To better manage swelling and shortness of breath       Care Coordination Interventions: Evaluation of current treatment plan related to nonischemic cardiomyopathy and patient's adherence to plan as established by provider Determined patient's shortness of breath has improved since increasing her diuretic as directed Reviewed scheduled/upcoming provider appointment including: initial follow up visit with [facility] scheduled for 02/22/23 @1 :30 PM Discussed the importance of keeping all appointments with provider        Our next appointment is by telephone on 02/27/23 at 12:30 PM   Please call the care guide team at [phone] if you need to cancel or reschedule your appointment.   If you are experiencing a Mental Health or Behavioral Health Crisis or need someone to talk to, please call 1-800-273-TALK (toll free, 24 hour hotline)  Patient verbalizes understanding of instructions and care plan provided today and agrees to view in MyChart. Active MyChart status and patient understanding of how to access instructions and care plan via MyChart confirmed with patient.     Delsa Sale, RN, BSN, CCM Care Management Coordinator [facility] Care Management  Direct Phone: [phone]

## 2023-01-24 NOTE — Patient Outreach (Signed)
  Care Coordination   Follow Up Visit Note   01/24/2023 Name: Crystal [name] MRN: [id number] DOB: 04-01-1951  Crystal [name] is a 72 y.o. year old female who sees Rodrigo Ran, MD for primary care. I spoke with  Crystal [name] by phone today.  What matters to the patients health and wellness today?  Patient would like to have her shortness of breath evaluated and treated.     Goals Addressed             This Visit's Progress    To better manage swelling and shortness of breath       Care Coordination Interventions: Evaluation of current treatment plan related to nonischemic cardiomyopathy and patient's adherence to plan as established by provider Determined patient's shortness of breath has improved since increasing her diuretic as directed Reviewed scheduled/upcoming provider appointment including: initial follow up visit with [facility] scheduled for 02/22/23 @1 :30 PM Discussed the importance of keeping all appointments with provider    Interventions Today    Flowsheet Row Most Recent Value  Chronic Disease   Chronic disease during today's visit Other  [dyspnea]  General Interventions   General Interventions Discussed/Reviewed General Interventions Discussed, General Interventions Reviewed, Doctor Visits  Doctor Visits Discussed/Reviewed Doctor Visits Discussed, Doctor Visits Reviewed, Specialist  Education Interventions   Education Provided Provided Education  Provided Verbal Education On Medication, When to see the doctor  Pharmacy Interventions   Pharmacy Dicussed/Reviewed Medications and their functions, Pharmacy Topics Discussed, Pharmacy Topics Reviewed          SDOH assessments and interventions completed:  No     Care Coordination Interventions:  Yes, provided   Follow up plan: Follow up call scheduled for 02/27/23 @12 :30 PM    Encounter Outcome:  Pt. Visit Completed

## 2023-01-27 ENCOUNTER — Other Ambulatory Visit: Payer: Self-pay | Admitting: Interventional Cardiology

## 2023-02-07 DIAGNOSIS — I130 Hypertensive heart and chronic kidney disease with heart failure and stage 1 through stage 4 chronic kidney disease, or unspecified chronic kidney disease: Secondary | ICD-10-CM | POA: Diagnosis not present

## 2023-02-07 DIAGNOSIS — N184 Chronic kidney disease, stage 4 (severe): Secondary | ICD-10-CM | POA: Diagnosis not present

## 2023-02-07 DIAGNOSIS — E1129 Type 2 diabetes mellitus with other diabetic kidney complication: Secondary | ICD-10-CM | POA: Diagnosis not present

## 2023-02-07 DIAGNOSIS — Z794 Long term (current) use of insulin: Secondary | ICD-10-CM | POA: Diagnosis not present

## 2023-02-07 DIAGNOSIS — E785 Hyperlipidemia, unspecified: Secondary | ICD-10-CM | POA: Diagnosis not present

## 2023-02-07 DIAGNOSIS — G609 Hereditary and idiopathic neuropathy, unspecified: Secondary | ICD-10-CM | POA: Diagnosis not present

## 2023-02-09 ENCOUNTER — Other Ambulatory Visit: Payer: Self-pay | Admitting: Internal Medicine

## 2023-02-09 DIAGNOSIS — Z139 Encounter for screening, unspecified: Secondary | ICD-10-CM

## 2023-02-10 ENCOUNTER — Ambulatory Visit
Admission: RE | Admit: 2023-02-10 | Discharge: 2023-02-10 | Disposition: A | Discharge status: Home / Self Care | Payer: Medicare Other | Source: Ambulatory Visit | Attending: Internal Medicine | Admitting: Internal Medicine

## 2023-02-10 ENCOUNTER — Telehealth: Payer: Self-pay

## 2023-02-10 DIAGNOSIS — N184 Chronic kidney disease, stage 4 (severe): Secondary | ICD-10-CM | POA: Diagnosis not present

## 2023-02-10 DIAGNOSIS — Z1231 Encounter for screening mammogram for malignant neoplasm of breast: Secondary | ICD-10-CM | POA: Diagnosis not present

## 2023-02-10 DIAGNOSIS — Z139 Encounter for screening, unspecified: Secondary | ICD-10-CM

## 2023-02-10 NOTE — Telephone Encounter (Signed)
Call to pt reference PREP class starting on 02/20/23 MW [id number] Confirmed starting then.  Intake scheduled for 02/15/23 at 11am at [facility].

## 2023-02-15 ENCOUNTER — Other Ambulatory Visit: Payer: Self-pay | Admitting: Internal Medicine

## 2023-02-15 DIAGNOSIS — R928 Other abnormal and inconclusive findings on diagnostic imaging of breast: Secondary | ICD-10-CM

## 2023-02-15 NOTE — Progress Notes (Signed)
[facility] PREP Evaluation  Patient Details  Name: Crystal [name] MRN: [id number] Date of Birth: 1950/08/29 Age: 72 y.o. PCP: Rodrigo Ran, MD  Vitals:   02/15/23 1130  BP: (!) 118/58  Pulse: 73  SpO2: 94%  Weight: 251 lb 9.6 oz (114.1 kg)     [facility] Eval - 02/15/23 1200       [facility] "PREP" Location   [facility] "PREP" Location [facility]      Referral    Referring Provider Perini    Reason for referral Inactivity;Hypertension;Heart Failure;Diabetes;Obesitity/Overweight    Program Start Date 02/20/23   MW [id number] x 12wks     Measurement   Waist Circumference 57 inches    Hip Circumference 57 inches    Body fat --   not measured has defib     Information for Trainer   Goals Feel better, less pain, get aroud easier, less sob    Current Exercise some chair exercises    Orthopedic Concerns Bil OA knees, LBP, dec shoulder mobility    Pertinent Medical History NICM, LBBB, CHF, HTN, MI, IDDM2, CKD 3    Current Barriers energy levels, appts    Restrictions/Precautions Diabetic snack before exercise;Assistive device    Medications that affect exercise Beta blocker;Asthma inhaler;Medication causing dizziness/drowsiness      Timed Up and Go (TUGS)   Timed Up and Go High risk >13 seconds      Mobility and Daily Activities   I find it easy to walk up or down two or more flights of stairs. 1    I have no trouble taking out the trash. 2    I do housework such as vacuuming and dusting on my own without difficulty. 2    I can easily lift a gallon of milk (8lbs). 4    I can easily walk a mile. 1    I have no trouble reaching into high cupboards or reaching down to pick up something from the floor. 1    I do not have trouble doing out-door work such as Loss adjuster, chartered, raking leaves, or gardening. 1      Mobility and Daily Activities   I feel younger than my age. 1    I feel independent. 4    I feel energetic. 1    I live an active life.  2    I feel strong. 2    I feel healthy. 1     I feel active as other people my age. 1      How fit and strong are you.   Fit and Strong Total Score 24            Past Medical History:  Diagnosis Date   AICD (automatic cardioverter/defibrillator) present    high RV threshold chronically, device was turned off in 2014; Device battery has been dead x 7 years; "turned it back on 06/22/2018"   Anemia, iron deficiency    Angioedema    felt to likely be due to ace inhibitors but says she has had this even off of medicines, appears to be tolerating ARBs chronically   Anxiety    Arthritis    "hands, legs, arms; bad in my back" (06/22/2018)   Asthmatic bronchitis with status asthmaticus    Bradycardia    CHF (congestive heart failure) (HCC)    Chronic lower back pain    Chronic pain    CKD (chronic kidney disease), stage III (HCC)    Coronary artery disease  Mild nonobstructive (30% LAD, 30% RCA) by 09/01/16 cath at [facility]   Degenerative joint disease    Depression    Diabetes mellitus, type 2 (HCC)    Diabetic peripheral neuropathy (HCC)    Dizziness    Dyspnea on exertion    Family history of adverse reaction to anesthesia    Mother has nausea   GERD (gastroesophageal reflux disease)    Gout    Heart valve disorder    History of blood transfusion 1981; ~ 2005; 12/2016   "childbirth; defibrillator OR; colostomy OR"   History of mononucleosis 03/2014   Hyperlipidemia    Hypertension    Hypothyroid    Insomnia    Intervertebral disc degeneration    Ischemic cardiomyopathy    LBBB (left bundle branch block)    Myocardial infarction (HCC) dx'd ~ 2005   Nonischemic cardiomyopathy (HCC)    s/p ICD in 2005. EF has since recovered   Obesity    On home oxygen therapy    "2L at night" (06/22/2018)   OSA on CPAP    Pneumonia    "couple times; last time was 12/2016" (06/22/2018)   PONV (postoperative nausea and vomiting)    Presence of permanent cardiac pacemaker    [employer]   Swelling    Syncope     Systemic hypertension    Vitamin D deficiency    Past Surgical History:  Procedure Laterality Date   APPENDECTOMY  07/13/2017   BLADDER SUSPENSION  1990   "w/hysterectomy"   BOWEL RESECTION N/A 07/09/2018   Procedure: SMALL BOWEL RESECTION;  Surgeon: Jimmye Norman, MD;  Location: [facility] OR;  Service: General;  Laterality: N/A;   CARDIAC CATHETERIZATION  2005   no obstructive CAD per patient   CARDIAC CATHETERIZATION  2018   CARDIAC DEFIBRILLATOR PLACEMENT  2005   BiV ICD implanted,  LV lead is an epicardial lead   CARPAL TUNNEL RELEASE Left 07/25/2014   Dr.Williamson    CARPAL TUNNEL RELEASE Right 04/08/2019   Procedure: RIGHT CARPAL TUNNEL RELEASE ENDOSCOPIC;  Surgeon: Mack Hook, MD;  Location: [facility] OR;  Service: Orthopedics;  Laterality: Right;   CATARACT EXTRACTION W/ INTRAOCULAR LENS  IMPLANT, BILATERAL Bilateral    COLONIC STENT PLACEMENT N/A 08/31/2017   Procedure: COLONIC STENT PLACEMENT;  Surgeon: Jeani Hawking, MD;  Location: [facility] ENDOSCOPY;  Service: Endoscopy;  Laterality: N/A;   COLONOSCOPY     2010-2011 Dr.Kipreos    COLOSTOMY  12/2016   Hattie Perch 01/26/2017   COLOSTOMY REVERSAL N/A 07/13/2017   Procedure: COLOSTOMY REVERSAL;  Surgeon: Jimmye Norman, MD;  Location: [facility] OR;  Service: General;  Laterality: N/A;   CYST REMOVAL HAND Right 05/2013   thumb   DILATION AND CURETTAGE OF UTERUS  X 5-6   ESOPHAGOGASTRODUODENOSCOPY (EGD) WITH PROPOFOL N/A 03/13/2020   Procedure: ESOPHAGOGASTRODUODENOSCOPY (EGD) WITH PROPOFOL;  Surgeon: Jeani Hawking, MD;  Location: [facility] ENDOSCOPY;  Service: Endoscopy;  Laterality: N/A;   EXCISION MASS ABDOMINAL N/A 07/09/2018   Procedure: EXPLORATION OF  ABDOMINAL WOUND ERAS PATHWAY;  Surgeon: Jimmye Norman, MD;  Location: [facility] OR;  Service: General;  Laterality: N/A;   FLEXIBLE SIGMOIDOSCOPY N/A 08/24/2017   Procedure: Arnell Sieving;  Surgeon: Jeani Hawking, MD;  Location: [facility] ENDOSCOPY;  Service: Endoscopy;  Laterality: N/A;   FLEXIBLE SIGMOIDOSCOPY  N/A 08/31/2017   Procedure: FLEXIBLE SIGMOIDOSCOPY;  Surgeon: Jeani Hawking, MD;  Location: [facility] ENDOSCOPY;  Service: Endoscopy;  Laterality: N/A;  stent placement   FLEXIBLE SIGMOIDOSCOPY N/A 03/13/2020   Procedure: FLEXIBLE SIGMOIDOSCOPY;  Surgeon: Jeani Hawking, MD;  Location: [facility] ENDOSCOPY;  Service: Endoscopy;  Laterality: N/A;   FLEXIBLE SIGMOIDOSCOPY N/A 12/18/2020   Procedure: FLEXIBLE SIGMOIDOSCOPY;  Surgeon: Jeani Hawking, MD;  Location: [facility] ENDOSCOPY;  Service: Endoscopy;  Laterality: N/A;   HEMOSTASIS CLIP PLACEMENT  03/13/2020   Procedure: HEMOSTASIS CLIP PLACEMENT;  Surgeon: Jeani Hawking, MD;  Location: [facility] ENDOSCOPY;  Service: Endoscopy;;   HEMOSTASIS CLIP PLACEMENT  12/18/2020   Procedure: HEMOSTASIS CLIP PLACEMENT;  Surgeon: Jeani Hawking, MD;  Location: [facility] ENDOSCOPY;  Service: Endoscopy;;   HOT HEMOSTASIS N/A 03/13/2020   Procedure: HOT HEMOSTASIS (ARGON PLASMA COAGULATION/BICAP);  Surgeon: Jeani Hawking, MD;  Location: [facility] ENDOSCOPY;  Service: Endoscopy;  Laterality: N/A;   HOT HEMOSTASIS N/A 12/18/2020   Procedure: HOT HEMOSTASIS (ARGON PLASMA COAGULATION/BICAP);  Surgeon: Jeani Hawking, MD;  Location: [facility] ENDOSCOPY;  Service: Endoscopy;  Laterality: N/A;   IMPLANTABLE CARDIOVERTER DEFIBRILLATOR GENERATOR CHANGE  2008   INSERTION OF MESH N/A 07/09/2018   Procedure: INSERTION OF VICRYL MESH;  Surgeon: Jimmye Norman, MD;  Location: [facility] OR;  Service: General;  Laterality: N/A;   LAPAROTOMY N/A 09/18/2017   Procedure: EXPLORATORY LAPAROTOMY;  Surgeon: Jimmye Norman, MD;  Location: [facility] OR;  Service: General;  Laterality: N/A;   LEAD REVISION  06/22/2018   LEAD REVISION/REPAIR N/A 06/22/2018   Procedure: LEAD REVISION/REPAIR;  Surgeon: Duke Salvia, MD;  Location: [facility] INVASIVE CV LAB;  Service: Cardiovascular;  Laterality: N/A;   LYSIS OF ADHESION N/A 09/18/2017   Procedure: LYSIS OF ADHESION;  Surgeon: Jimmye Norman, MD;  Location: [facility] OR;  Service: General;  Laterality: N/A;   LYSIS OF ADHESION  N/A 07/09/2018   Procedure: LYSIS OF ADHESION;  Surgeon: Jimmye Norman, MD;  Location: [facility] OR;  Service: General;  Laterality: N/A;   PARTIAL COLECTOMY N/A 09/18/2017   Procedure: ILEOCOLECTOMY;  Surgeon: Jimmye Norman, MD;  Location: [facility] OR;  Service: General;  Laterality: N/A;   PARTIAL COLECTOMY  09/25/2017   POLYPECTOMY  03/13/2020   Procedure: POLYPECTOMY;  Surgeon: Jeani Hawking, MD;  Location: [facility] ENDOSCOPY;  Service: Endoscopy;;   RIGHT/LEFT HEART CATH AND CORONARY ANGIOGRAPHY N/A 06/01/2018   Procedure: RIGHT/LEFT HEART CATH AND CORONARY ANGIOGRAPHY;  Surgeon: Corky Crafts, MD;  Location: [facility] INVASIVE CV LAB;  Service: Cardiovascular;  Laterality: N/A;   SIGMOIDOSCOPY N/A 09/18/2017   Procedure: SIGMOIDOSCOPY;  Surgeon: Jimmye Norman, MD;  Location: [facility] OR;  Service: General;  Laterality: N/A;   SMALL INTESTINE SURGERY  07/09/2018   EXPLORATION OF  ABDOMINAL WOUND ERAS PATHWAYN/; MESH; LYSIS OF ADHESIONS   TOTAL ABDOMINAL HYSTERECTOMY  1990   TRANSFORAMINAL LUMBAR INTERBODY FUSION (TLIF) WITH PEDICLE SCREW FIXATION 2 LEVEL N/A 01/07/2020   Procedure: Lumbar Four-Five and Lumbar Five-Sacral One open lumbar decompression and transforaminal lumbar interbody fusion;  Surgeon: Jadene Pierini, MD;  Location: [facility] OR;  Service: Neurosurgery;  Laterality: N/A;  Lumbar Four-Five and Lumbar Five-Sacral One open lumbar decompression and transforaminal lumbar interbody fusion   TRIGGER FINGER RELEASE Right 05/213   TRIGGER FINGER RELEASE Right 04/08/2019   Procedure: RIGHT INDEX RELEASE TRIGGER FINGER/A-1 PULLEY;  Surgeon: Mack Hook, MD;  Location: [facility] OR;  Service: Orthopedics;  Laterality: Right;   TUBAL LIGATION  1980s   VESICOVAGINAL FISTULA CLOSURE W/ TAH     Social History   Tobacco Use  Smoking Status Never  Smokeless Tobacco Never    Bonnye Fava 02/15/2023, 12:42 PM

## 2023-02-21 ENCOUNTER — Ambulatory Visit
Admission: RE | Admit: 2023-02-21 | Discharge: 2023-02-21 | Disposition: A | Discharge status: Home / Self Care | Payer: Medicare Other | Source: Ambulatory Visit | Attending: Internal Medicine | Admitting: Internal Medicine

## 2023-02-21 DIAGNOSIS — R921 Mammographic calcification found on diagnostic imaging of breast: Secondary | ICD-10-CM | POA: Diagnosis not present

## 2023-02-21 DIAGNOSIS — R928 Other abnormal and inconclusive findings on diagnostic imaging of breast: Secondary | ICD-10-CM | POA: Diagnosis not present

## 2023-02-21 DIAGNOSIS — N6011 Diffuse cystic mastopathy of right breast: Secondary | ICD-10-CM | POA: Diagnosis not present

## 2023-02-22 ENCOUNTER — Ambulatory Visit (INDEPENDENT_AMBULATORY_CARE_PROVIDER_SITE_OTHER): Payer: Medicare Other | Admitting: Pulmonary Disease

## 2023-02-22 ENCOUNTER — Ambulatory Visit: Payer: Medicare Other

## 2023-02-22 ENCOUNTER — Encounter: Payer: Self-pay | Admitting: Pulmonary Disease

## 2023-02-22 VITALS — BP 124/80 | HR 80 | Ht 64.0 in | Wt 254.2 lb

## 2023-02-22 DIAGNOSIS — R0600 Dyspnea, unspecified: Secondary | ICD-10-CM | POA: Diagnosis not present

## 2023-02-22 DIAGNOSIS — R0609 Other forms of dyspnea: Secondary | ICD-10-CM

## 2023-02-22 DIAGNOSIS — Z950 Presence of cardiac pacemaker: Secondary | ICD-10-CM | POA: Diagnosis not present

## 2023-02-22 MED ORDER — FLUTICASONE FUROATE-VILANTEROL 200-25 MCG/ACT IN AEPB
1.0000 | INHALATION_SPRAY | Freq: Every day | RESPIRATORY_TRACT | 3 refills | Status: DC
Start: 2023-02-22 — End: 2023-07-04

## 2023-02-22 NOTE — Patient Instructions (Signed)
Nice to meet you  Chest xray today  Pulmonary function tests ordered to get more information  Try Breo 1 puff once a day everyday - rinse your mouth out well with water after every use  If this is too expensive please let me know and I will look for a replacement   Return to clinic in 3 months or sooner as needed

## 2023-02-22 NOTE — Progress Notes (Signed)
@Patient  ID: Crystal [name], female    DOB: 03-09-1951, 72 y.o.   MRN: [id number]  Chief Complaint  Patient presents with   Consult    Sob, pt has been diagnosed with asthma years ago. Pt on o2 at night and cpap     Referring provider: Bunnie Domino  HPI:   72 y.o. woman here for evaluation of dyspnea exertion.  Multiple cardiology notes reviewed.  History of nonischemic cardiomyopathy.  EF is gradually improved to normal after evidence-based medicine, cardiac treatment.  In general is short of breath.  With exertion.  Worse on inclines or stairs.  No time of day with any better or worse.  No position make it better or worse.  No seasonal or environmental factors she got to the hide on exam is better or worse.  Feels like this has been on Lasix things are better.  Seems like sometimes when awake and lower extremity swelling fluctuates breathing worsens in the morning gets better and improves.  No relieving or exacerbating factors.  Diagnosed with asthma in the past.  Shortness of this come back.  Not on any inhalers.  She is on nocturnal oxygen.  Most recent chest x-ray 10/2021 reveals on my review and interpretation enlarged cardiac silhouette, otherwise clear lungs.  No lateral view so evaluation is limited.  Questionaires / Pulmonary Flowsheets:   ACT:      No data to display          MMRC:     No data to display          Epworth:      No data to display          Tests:   FENO:  No results found for: "NITRICOXIDE"  PFT:     No data to display          WALK:      No data to display          Imaging: Personally reviewed and as per EMR discussion this note MM 3D DIAGNOSTIC MAMMOGRAM UNILATERAL RIGHT BREAST  Result Date: 02/21/2023 CLINICAL DATA:  Screening recall for right breast calcifications and possible asymmetry. EXAM: DIGITAL DIAGNOSTIC UNILATERAL RIGHT MAMMOGRAM WITH TOMOSYNTHESIS AND CAD; ULTRASOUND RIGHT BREAST LIMITED TECHNIQUE:  Right digital diagnostic mammography and breast tomosynthesis was performed. The images were evaluated with computer-aided detection. ; Targeted ultrasound examination of the right breast was performed COMPARISON:  Previous exam(s). ACR Breast Density Category b: There are scattered areas of fibroglandular density. FINDINGS: There is a persistent oval, circumscribed equal density mass in the slightly upper outer right breast at mid to posterior depth. Loosely grouped and scattered punctate calcifications throughout the upper-outer quadrant are stable when compared to multiple prior studies. Otherwise, no new or suspicious findings identified. Targeted ultrasound is performed, showing scattered anechoic cysts throughout the upper-outer quadrant of the right breast. Specifically a 9 x 6 x 9 mm cyst at the 10 o'clock position 2 cm from the nipple likely corresponds with the mammographic finding. IMPRESSION: Benign right breast fibrocystic changes and calcifications. No further follow-up required. RECOMMENDATION: Screening mammogram in one year.(Code:SM-B-01Y) I have discussed the findings and recommendations with the patient. If applicable, a reminder letter will be sent to the patient regarding the next appointment. BI-RADS CATEGORY  2: Benign. Electronically Signed   By: Sande Brothers M.D.   On: 02/21/2023 14:39   [place] LIMITED ULTRASOUND INCLUDING AXILLA RIGHT BREAST  Result Date: 02/21/2023 CLINICAL DATA:  Screening recall  for right breast calcifications and possible asymmetry. EXAM: DIGITAL DIAGNOSTIC UNILATERAL RIGHT MAMMOGRAM WITH TOMOSYNTHESIS AND CAD; ULTRASOUND RIGHT BREAST LIMITED TECHNIQUE: Right digital diagnostic mammography and breast tomosynthesis was performed. The images were evaluated with computer-aided detection. ; Targeted ultrasound examination of the right breast was performed COMPARISON:  Previous exam(s). ACR Breast Density Category b: There are scattered areas of fibroglandular density.  FINDINGS: There is a persistent oval, circumscribed equal density mass in the slightly upper outer right breast at mid to posterior depth. Loosely grouped and scattered punctate calcifications throughout the upper-outer quadrant are stable when compared to multiple prior studies. Otherwise, no new or suspicious findings identified. Targeted ultrasound is performed, showing scattered anechoic cysts throughout the upper-outer quadrant of the right breast. Specifically a 9 x 6 x 9 mm cyst at the 10 o'clock position 2 cm from the nipple likely corresponds with the mammographic finding. IMPRESSION: Benign right breast fibrocystic changes and calcifications. No further follow-up required. RECOMMENDATION: Screening mammogram in one year.(Code:SM-B-01Y) I have discussed the findings and recommendations with the patient. If applicable, a reminder letter will be sent to the patient regarding the next appointment. BI-RADS CATEGORY  2: Benign. Electronically Signed   By: Sande Brothers M.D.   On: 02/21/2023 14:39   MM 3D SCREENING MAMMOGRAM BILATERAL BREAST  Result Date: 02/14/2023 CLINICAL DATA:  Screening. EXAM: DIGITAL SCREENING BILATERAL MAMMOGRAM WITH TOMOSYNTHESIS AND CAD TECHNIQUE: Bilateral screening digital craniocaudal and mediolateral oblique mammograms were obtained. Bilateral screening digital breast tomosynthesis was performed. The images were evaluated with computer-aided detection. Best images possible per technologist communication. COMPARISON:  Previous exam(s). ACR Breast Density Category b: There are scattered areas of fibroglandular density. FINDINGS: In the right breast, a possible asymmetry with calcifications warrants further evaluation. In the left breast, no findings suspicious for malignancy. IMPRESSION: Further evaluation is suggested for possible asymmetry with calcifications in the right breast. RECOMMENDATION: Diagnostic mammogram and possibly ultrasound of the right breast. (Code:[id number])  The patient will be contacted regarding the findings, and additional imaging will be scheduled. BI-RADS CATEGORY  0: Incomplete: Need additional imaging evaluation. Electronically Signed   By: Meda Klinefelter M.D.   On: 02/14/2023 10:52    Lab Results: Personally reviewed CBC    Component Value Date/Time   WBC 11.1 (H) 10/28/2021 0809   RBC 4.43 10/28/2021 0809   HGB 10.4 (L) 10/28/2021 0809   HGB 11.9 09/24/2018 1052   HCT 34.5 (L) 10/28/2021 0809   HCT 37.0 09/24/2018 1052   PLT 274 10/28/2021 0809   PLT 208 09/24/2018 1052   MCV 77.9 (L) 10/28/2021 0809   MCV 84 09/24/2018 1052   MCH 23.5 (L) 10/28/2021 0809   MCHC 30.1 10/28/2021 0809   RDW 17.9 (H) 10/28/2021 0809   RDW 15.8 (H) 09/24/2018 1052   LYMPHSABS 2.2 10/27/2021 0905   LYMPHSABS 2.0 06/11/2018 1116   MONOABS 1.1 (H) 10/27/2021 0905   EOSABS 0.3 10/27/2021 0905   EOSABS 0.2 06/11/2018 1116   BASOSABS 0.1 10/27/2021 0905   BASOSABS 0.1 06/11/2018 1116    BMET    Component Value Date/Time   NA 139 11/25/2022 1439   K 4.2 11/25/2022 1439   CL 99 11/25/2022 1439   CO2 25 11/25/2022 1439   GLUCOSE 130 (H) 11/25/2022 1439   GLUCOSE 78 10/30/2021 0404   BUN 53 (H) 11/25/2022 1439   CREATININE 2.52 (H) 11/25/2022 1439   CREATININE 3.10 (H) 08/17/2017 1545   CALCIUM 9.0 11/25/2022 1439   GFRNONAA 35 (L) 10/30/2021  0404   GFRNONAA 15 (L) 08/17/2017 1545   GFRAA 32 (L) 01/08/2020 0618   GFRAA 17 (L) 08/17/2017 1545    BNP    Component Value Date/Time   BNP 50.2 08/07/2019 1732    ProBNP    Component Value Date/Time   PROBNP 948 (H) 05/23/2018 1440    Specialty Problems       Pulmonary Problems   Airway hyperreactivity   Acute respiratory failure with hypoxia (HCC)   Acute hypoxemic respiratory failure due to COVID-19 (HCC)    Allergies  Allergen Reactions   Ace Inhibitors Swelling and Other (See Comments)    Angioedema Other reaction(s): swelling Other reaction(s): swelling Other  reaction(s): swelling Other reaction(s): Unknown   Glucophage [Metformin Hcl] Other (See Comments)    Renal failure   Telmisartan Other (See Comments)    AVOID ARB/ ACEi in this patient due to recurrent AKI   Advicor [Niacin-Lovastatin Er] Other (See Comments)    Muscle aches   Atorvastatin Other (See Comments)    Muscle aches   Bystolic [Nebivolol Hcl] Swelling    Whole body swelled   Erythromycin Diarrhea, Nausea And Vomiting and Other (See Comments)    *DERIVATIVES*  Other reaction(s): severe vomiting  Other reaction(s): Unknown   Lopid [Gemfibrozil] Other (See Comments)    Muscle aches   Statins Other (See Comments)    Muscle aches tolerates crestor Other reaction(s): Unknown Other reaction(s): muscle aches   Erythromycin Base Nausea And Vomiting   Hydromorphone Other (See Comments)    Confusion Other reaction(s): confusion.can take lower level narcs but those cause a lot of itching Other reaction(s): confusion.can take lower level narcs but those cause a lot of itching   Lisinopril    Losartan     Other reaction(s): stopped 12/2018. ? due to worse gfr Other reaction(s): stopped 12/2018. ? due to worse gfr Other reaction(s): stopped 12/2018. ? due to worse gfr Other reaction(s): stopped 12/2018. ? due to worse gfr   Nebivolol Hcl Swelling    Other reaction(s): swelling  Other reaction(s): Unknown  Other Reaction(s): Not available   Other     Other reaction(s): muscle aches   Sacubitril-Valsartan     Other reaction(s): itching Other reaction(s): itching Other reaction(s): itching Other reaction(s): itching   Spironolactone     Other reaction(s): arf Other reaction(s): arf Other reaction(s): arf Other reaction(s): arf    Immunization History  Administered Date(s) Administered   Influenza, High Dose Seasonal PF 05/04/2018   Influenza,inj,Quad PF,6+ Mos 04/26/2017   Influenza-Unspecified 05/11/2016   PFIZER(Purple Top)SARS-COV-2 Vaccination 11/16/2019   PPD  Test 06/10/2015   Pneumococcal Conjugate-13 11/17/2011   Pneumococcal Polysaccharide-23 05/07/2004, 07/14/2017   Td 09/19/2001   Zoster, Live 05/24/2012    Past Medical History:  Diagnosis Date   AICD (automatic cardioverter/defibrillator) present    high RV threshold chronically, device was turned off in 2014; Device battery has been dead x 7 years; "turned it back on 06/22/2018"   Anemia, iron deficiency    Angioedema    felt to likely be due to ace inhibitors but says she has had this even off of medicines, appears to be tolerating ARBs chronically   Anxiety    Arthritis    "hands, legs, arms; bad in my back" (06/22/2018)   Asthmatic bronchitis with status asthmaticus    Bradycardia    CHF (congestive heart failure) (HCC)    Chronic lower back pain    Chronic pain    CKD (chronic kidney disease),  stage III [facility])    Coronary artery disease    Mild nonobstructive (30% LAD, 30% RCA) by 09/01/16 cath at [facility]   Degenerative joint disease    Depression    Diabetes mellitus, type 2 (HCC)    Diabetic peripheral neuropathy (HCC)    Dizziness    Dyspnea on exertion    Family history of adverse reaction to anesthesia    Mother has nausea   GERD (gastroesophageal reflux disease)    Gout    Heart valve disorder    History of blood transfusion 1981; ~ 2005; 12/2016   "childbirth; defibrillator OR; colostomy OR"   History of mononucleosis 03/2014   Hyperlipidemia    Hypertension    Hypothyroid    Insomnia    Intervertebral disc degeneration    Ischemic cardiomyopathy    LBBB (left bundle branch block)    Myocardial infarction (HCC) dx'd ~ 2005   Nonischemic cardiomyopathy (HCC)    s/p ICD in 2005. EF has since recovered   Obesity    On home oxygen therapy    "2L at night" (06/22/2018)   OSA on CPAP    Pneumonia    "couple times; last time was 12/2016" (06/22/2018)   PONV (postoperative nausea and vomiting)    Presence of permanent cardiac pacemaker    [place]  [employer]   Swelling    Syncope    Systemic hypertension    Vitamin D deficiency     Tobacco History: Social History   Tobacco Use  Smoking Status Never  Smokeless Tobacco Never   Counseling given: Not Answered   Continue to not smoke  Outpatient Encounter Medications as of 02/22/2023  Medication Sig   Accu-Chek FastClix Lancets MISC Apply topically 3 (three) times daily.   acetaminophen-codeine (TYLENOL #3) 300-30 MG tablet    albuterol (PROVENTIL) (2.5 MG/3ML) 0.083% nebulizer solution 3 mL as needed Inhalation every 6 hrs for 30 days   allopurinol (ZYLOPRIM) 100 MG tablet Take by mouth.   carvedilol (COREG) 25 MG tablet Take 25 mg by mouth 2 (two) times daily.    Cholecalciferol 50 MCG (2000 UT) TABS 1 capsule Orally Once a day   Continuous Blood Gluc Sensor (FREESTYLE LIBRE 2 SENSOR) MISC See admin instructions.   dapagliflozin propanediol (FARXIGA) 10 MG TABS tablet Take 10 mg by mouth daily.   DULoxetine (CYMBALTA) 60 MG capsule Take 60 mg by mouth daily.   febuxostat (ULORIC) 40 MG tablet Take 40 mg by mouth at bedtime.   fluticasone furoate-vilanterol (BREO ELLIPTA) 200-25 MCG/ACT AEPB Inhale 1 puff into the lungs daily.   gabapentin (NEURONTIN) 300 MG capsule Take 300 mg by mouth at bedtime.   hydrALAZINE (APRESOLINE) 25 MG tablet Take 1 tablet (25 mg total) by mouth in the morning and at bedtime.   INSULIN ASPART [place]    Insulin Regular Human (HUMULIN R U-500 KWIKPEN [place]) Inject 130 Units into the skin 3 (three) times daily. Pt takes 130 units in the morning, and 145 units in the afternoon and night.   insulin regular human CONCENTRATED (HUMULIN R U-500 KWIKPEN) 500 UNIT/ML kwikpen Inject 130 Units into the skin 3 (three) times daily with meals.   insulin regular human CONCENTRATED (HUMULIN R) 500 UNIT/ML injection Inject 130 Units into the skin 3 (three) times daily with meals. Pt injects 130 units in the AM, and 145 units in the   levothyroxine (SYNTHROID, LEVOTHROID)  88 MCG tablet Take 88 mcg by mouth daily before breakfast.  loratadine (CLARITIN) 10 MG tablet Take 10 mg by mouth daily.   montelukast (SINGULAIR) 10 MG tablet Take by mouth.   Omega-3 Fatty Acids (OMEGA 3 PO) Take 1 mg by mouth in the morning and at bedtime.   oxyCODONE-acetaminophen (PERCOCET) 7.5-325 MG tablet Take 1 tablet by mouth every 8 (eight) hours as needed.   pantoprazole (PROTONIX) 40 MG tablet Take 40 mg by mouth 2 (two) times daily.    PRESCRIPTION MEDICATION Inhale into the lungs at bedtime. CPAP with  2 liter oxygen   PROCTO-MED HC 2.5 % rectal cream Apply topically.   rosuvastatin (CRESTOR) 5 MG tablet 2 tablet Orally Once a day   temazepam (RESTORIL) 15 MG capsule Take by mouth.   torsemide (DEMADEX) 20 MG tablet Take 20 mg by mouth as needed.   No facility-administered encounter medications on file as of 02/22/2023.     Review of Systems  Review of Systems  No chest pain exertion.  No orthopnea or PND.  Comprehensive review of systems otherwise negative. Physical Exam  BP 124/80   Pulse 80   Ht 5\' 4"  (1.626 m)   Wt 254 lb 3.2 oz (115.3 kg)   SpO2 95%   BMI 43.63 kg/m   Wt Readings from Last 5 Encounters:  02/22/23 254 lb 3.2 oz (115.3 kg)  02/15/23 251 lb 9.6 oz (114.1 kg)  01/23/23 249 lb (112.9 kg)  12/27/22 250 lb (113.4 kg)  12/26/22 255 lb (115.7 kg)    BMI Readings from Last 5 Encounters:  02/22/23 43.63 kg/m  02/15/23 43.19 kg/m  01/23/23 42.74 kg/m  12/27/22 42.91 kg/m  12/26/22 43.77 kg/m     Physical Exam General: Sitting in chair, no acute distress Eyes: EOMI, no icterus Neck: Supple, no JVD. Pulmonary: Clear, normal work of breathing MSK: No synovitis, no joint effusion Neuro: Awake and alert, no focal deficits Psych normal mood, full affect   Assessment & Plan:   Dyspnea on exertion: Suspect multifactorial related to deconditioning, habitus.  Possible reemergence of asthma given history of the same.  Improves somewhat  with diuretic therapy arguing for component of pulmonary venous hypertension related structural heart disease.  Notably LVEDP, wedge pressure was elevated on prior catheterizations.  Group 2 pulmonary hypertension noted on right heart cath 05/2018, likely contributing.   PFTs for further evaluation.  Chest x-ray for further evaluation.  Asthma: Diagnosed years ago.  Has not had active prescription inhaler in years.  With worsening dyspnea concern for possible asthma.  Trial Breo 1 puff daily.  PFTs for further evaluation as above.   Return in about 3 months (around 05/25/2023) for f/u Dr. Judeth Horn, after PFT.   Karren Burly, MD 02/22/2023   This appointment required 61 minutes of patient care (this includes precharting, chart review, review of results, face-to-face care, etc.).

## 2023-02-22 NOTE — Progress Notes (Signed)
[facility] PREP Weekly Session  Patient Details  Name: [name] MRN: [id number] Date of Birth: 21-May-1951 Age: 72 y.o. PCP: Rodrigo Ran, MD  There were no vitals filed for this visit.   [facility] Weekly seesion - 02/22/23 1500       [facility] "PREP" Location   [facility] "PREP" Location [facility]      Weekly Session   Topic Discussed Goal setting and welcome to the program   Fit testing done   Classes attended to date 1             Bonnye Fava 02/22/2023, 3:34 PM

## 2023-02-23 ENCOUNTER — Encounter: Payer: Medicare Other | Attending: Registered Nurse | Admitting: Registered Nurse

## 2023-02-23 ENCOUNTER — Encounter: Payer: Self-pay | Admitting: Registered Nurse

## 2023-02-23 VITALS — BP 115/66 | HR 70 | Ht 64.0 in | Wt 252.0 lb

## 2023-02-23 DIAGNOSIS — M545 Low back pain, unspecified: Secondary | ICD-10-CM | POA: Diagnosis not present

## 2023-02-23 DIAGNOSIS — Z5181 Encounter for therapeutic drug level monitoring: Secondary | ICD-10-CM

## 2023-02-23 DIAGNOSIS — M2550 Pain in unspecified joint: Secondary | ICD-10-CM

## 2023-02-23 DIAGNOSIS — M170 Bilateral primary osteoarthritis of knee: Secondary | ICD-10-CM | POA: Diagnosis not present

## 2023-02-23 DIAGNOSIS — G894 Chronic pain syndrome: Secondary | ICD-10-CM

## 2023-02-23 DIAGNOSIS — Z79891 Long term (current) use of opiate analgesic: Secondary | ICD-10-CM | POA: Diagnosis not present

## 2023-02-23 DIAGNOSIS — G8929 Other chronic pain: Secondary | ICD-10-CM | POA: Diagnosis not present

## 2023-02-23 MED ORDER — OXYCODONE-ACETAMINOPHEN 7.5-325 MG PO TABS: 1.0000 | ORAL_TABLET | Freq: Three times a day (TID) | ORAL | 0 refills | Status: DC | PRN

## 2023-02-23 NOTE — Progress Notes (Signed)
Subjective:    Patient ID: Crystal [name], female    DOB: 1951/06/29, 72 y.o.   MRN: [id number]  HPI: Crystal [name] is a 72 y.o. female who returns for follow up appointment for chronic pain and medication refill. She states her pain is located in her lower back  and bilateral knee pain. She rates her pain 6. Her current exercise regime is walking, performing stretching exercises and attending the [employer] two days a week  Ms. Sansoucie Morphine equivalent is 33.75 MME.   Last Oral Swab was Performed on 09/27/2022, it was consistent.    Pain Inventory Average Pain 9 Pain Right Now 6 My pain is intermittent, constant, sharp, burning, stabbing, and aching  In the last 24 hours, has pain interfered with the following? General activity 7 Relation with others 6 Enjoyment of life 7 What TIME of day is your pain at its worst? morning , evening, and night Sleep (in general) Good  Pain is worse with: walking, bending, standing, and some activites Pain improves with: rest, medication, and injections Relief from Meds: 7  Family History  Problem Relation Age of Onset   Hypertension Father    Heart disease Father    Cancer Father        prostate   Heart disease Other        (Maternal side) Ischemic heart disease   Diabetes Mellitus II Other    Arrhythmia Mother    Diabetes Mellitus II Mother        Borderline DM   Hypertension Mother    Asthma Mother    Cancer Brother    Dementia Brother    Hypertension Brother    Hypertension Daughter    Hypertension Daughter    Diabetes Daughter    Hypertension Daughter    Atrial fibrillation Daughter    GER disease Daughter    Hypertension Son    Anxiety disorder Son    Hypothyroidism Son    Breast cancer Maternal Grandmother        in her 55's   Social History   Socioeconomic History   Marital status: Widowed    Spouse name: Not on file   Number of children: Not on file   Years of education: Not on file   Highest education level: Not on  file  Occupational History    Comment: retired LPN  Tobacco Use   Smoking status: Never   Smokeless tobacco: Never  Vaping Use   Vaping status: Never Used  Substance and Sexual Activity   Alcohol use: Not Currently   Drug use: Never   Sexual activity: Not Currently  Other Topics Concern   Not on file  Social History Narrative   [place].  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      As of 03/15/17:   Diet: 1800 Calorie      Caffeine: Yes      Married, if yes what year: Widowed, married 1973      Do you live in a house, apartment, assisted living, [place], trailer, ect: House, 1 stories, and 1 person      Pets: No      Current/Past profession: LPN, retired       Exercise: Yes, walking          Living Will: Yes   DNR: No   POA/HPOA: Yes      Functional Status:   Do you  have difficulty bathing or dressing yourself? No   Do you have difficulty preparing food or eating? No   Do you have difficulty managing your medications? No   Do you have difficulty managing your finances? No   Do you have difficulty affording your medications? Yes   Social Determinants of Health   Financial Resource Strain: Low Risk  (10/11/2017)   Overall Financial Resource Strain (CARDIA)    Difficulty of Paying Living Expenses: Not hard at all  Food Insecurity: No Food Insecurity (01/03/2022)   Hunger Vital Sign    Worried About Running Out of Food in the Last Year: Never true    Ran Out of Food in the Last Year: Never true  Transportation Needs: No Transportation Needs (01/03/2022)   PRAPARE - Administrator, Civil Service (Medical): No    Lack of Transportation (Non-Medical): No  Physical Activity: Inactive (10/11/2017)   Exercise Vital Sign    Days of Exercise per Week: 0 days    Minutes of Exercise per Session: 0 min  Stress: Stress Concern Present (10/11/2017)   [name] -  Occupational Stress Questionnaire    Feeling of Stress : To some extent  Social Connections: Somewhat Isolated (10/11/2017)   Social Connection and Isolation Panel [NHANES]    Frequency of Communication with Friends and Family: More than three times a week    Frequency of Social Gatherings with Friends and Family: More than three times a week    Attends Religious Services: More than 4 times per year    Active Member of [employer] or Organizations: No    Attends Banker Meetings: Never    Marital Status: Widowed   Past Surgical History:  Procedure Laterality Date   APPENDECTOMY  07/13/2017   BLADDER SUSPENSION  1990   "w/hysterectomy"   BOWEL RESECTION N/A 07/09/2018   Procedure: SMALL BOWEL RESECTION;  Surgeon: Jimmye Norman, MD;  [facility];  Service: General;  Laterality: N/A;   CARDIAC CATHETERIZATION  2005   no obstructive CAD per patient   CARDIAC CATHETERIZATION  2018   CARDIAC DEFIBRILLATOR PLACEMENT  2005   BiV ICD implanted,  LV lead is an epicardial lead   CARPAL TUNNEL RELEASE Left 07/25/2014   Dr.Williamson    CARPAL TUNNEL RELEASE Right 04/08/2019   Procedure: RIGHT CARPAL TUNNEL RELEASE ENDOSCOPIC;  Surgeon: Mack Hook, MD;  Location: [facility] OR;  Service: Orthopedics;  Laterality: Right;   CATARACT EXTRACTION W/ INTRAOCULAR LENS  IMPLANT, BILATERAL Bilateral    COLONIC STENT PLACEMENT N/A 08/31/2017   Procedure: COLONIC STENT PLACEMENT;  Surgeon: Jeani Hawking, MD;  Location: [facility] ENDOSCOPY;  Service: Endoscopy;  Laterality: N/A;   COLONOSCOPY     2010-2011 Dr.Kipreos    COLOSTOMY  12/2016   Hattie Perch 01/26/2017   COLOSTOMY REVERSAL N/A 07/13/2017   Procedure: COLOSTOMY REVERSAL;  Surgeon: Jimmye Norman, MD;  [facility];  Service: General;  Laterality: N/A;   CYST REMOVAL HAND Right 05/2013   thumb   DILATION AND CURETTAGE OF UTERUS  X 5-6   ESOPHAGOGASTRODUODENOSCOPY (EGD) WITH PROPOFOL N/A 03/13/2020   Procedure: ESOPHAGOGASTRODUODENOSCOPY (EGD) WITH  PROPOFOL;  Surgeon: Jeani Hawking, MD;  Location: [facility] ENDOSCOPY;  Service: Endoscopy;  Laterality: N/A;   EXCISION MASS ABDOMINAL N/A 07/09/2018   Procedure: EXPLORATION OF  ABDOMINAL WOUND ERAS PATHWAY;  Surgeon: Jimmye Norman, MD;  Location: [facility] OR;  Service: General;  Laterality: N/A;   FLEXIBLE SIGMOIDOSCOPY N/A 08/24/2017   Procedure: FLEXIBLE SIGMOIDOSCOPY;  Surgeon: Jeani Hawking, MD;  Location: [facility] ENDOSCOPY;  Service: Endoscopy;  Laterality: N/A;   FLEXIBLE SIGMOIDOSCOPY N/A 08/31/2017   Procedure: FLEXIBLE SIGMOIDOSCOPY;  Surgeon: Jeani Hawking, MD;  Location: [facility] ENDOSCOPY;  Service: Endoscopy;  Laterality: N/A;  stent placement   FLEXIBLE SIGMOIDOSCOPY N/A 03/13/2020   Procedure: FLEXIBLE SIGMOIDOSCOPY;  Surgeon: Jeani Hawking, MD;  [facility] ENDOSCOPY;  Service: Endoscopy;  Laterality: N/A;   FLEXIBLE SIGMOIDOSCOPY N/A 12/18/2020   Procedure: FLEXIBLE SIGMOIDOSCOPY;  Surgeon: Jeani Hawking, MD;  [facility] ENDOSCOPY;  Service: Endoscopy;  Laterality: N/A;   HEMOSTASIS CLIP PLACEMENT  03/13/2020   Procedure: HEMOSTASIS CLIP PLACEMENT;  Surgeon: Jeani Hawking, MD;  [facility] ENDOSCOPY;  Service: Endoscopy;;   HEMOSTASIS CLIP PLACEMENT  12/18/2020   Procedure: HEMOSTASIS CLIP PLACEMENT;  Surgeon: Jeani Hawking, MD;  [facility] ENDOSCOPY;  Service: Endoscopy;;   HOT HEMOSTASIS N/A 03/13/2020   Procedure: HOT HEMOSTASIS (ARGON PLASMA COAGULATION/BICAP);  Surgeon: Jeani Hawking, MD;  Location: [facility] ENDOSCOPY;  Service: Endoscopy;  Laterality: N/A;   HOT HEMOSTASIS N/A 12/18/2020   Procedure: HOT HEMOSTASIS (ARGON PLASMA COAGULATION/BICAP);  Surgeon: Jeani Hawking, MD;  Location: [facility] ENDOSCOPY;  Service: Endoscopy;  Laterality: N/A;   IMPLANTABLE CARDIOVERTER DEFIBRILLATOR GENERATOR CHANGE  2008   INSERTION OF MESH N/A 07/09/2018   Procedure: INSERTION OF VICRYL MESH;  Surgeon: Jimmye Norman, MD;  [facility] OR;  Service: General;  Laterality: N/A;   LAPAROTOMY N/A 09/18/2017   Procedure:  EXPLORATORY LAPAROTOMY;  Surgeon: Jimmye Norman, MD;  Location: [facility] OR;  Service: General;  Laterality: N/A;   LEAD REVISION  06/22/2018   LEAD REVISION/REPAIR N/A 06/22/2018   Procedure: LEAD REVISION/REPAIR;  Surgeon: Duke Salvia, MD;  Location: [facility] INVASIVE CV LAB;  Service: Cardiovascular;  Laterality: N/A;   LYSIS OF ADHESION N/A 09/18/2017   Procedure: LYSIS OF ADHESION;  Surgeon: Jimmye Norman, MD;  Location: [facility] OR;  Service: General;  Laterality: N/A;   LYSIS OF ADHESION N/A 07/09/2018   Procedure: LYSIS OF ADHESION;  Surgeon: Jimmye Norman, MD;  Location: [facility] OR;  Service: General;  Laterality: N/A;   PARTIAL COLECTOMY N/A 09/18/2017   Procedure: ILEOCOLECTOMY;  Surgeon: Jimmye Norman, MD;  Location: [facility] OR;  Service: General;  Laterality: N/A;   PARTIAL COLECTOMY  09/25/2017   POLYPECTOMY  03/13/2020   Procedure: POLYPECTOMY;  Surgeon: Jeani Hawking, MD;  [facility] ENDOSCOPY;  Service: Endoscopy;;   RIGHT/LEFT HEART CATH AND CORONARY ANGIOGRAPHY N/A 06/01/2018   Procedure: RIGHT/LEFT HEART CATH AND CORONARY ANGIOGRAPHY;  Surgeon: Corky Crafts, MD;  Location: [facility] INVASIVE CV LAB;  Service: Cardiovascular;  Laterality: N/A;   SIGMOIDOSCOPY N/A 09/18/2017   Procedure: SIGMOIDOSCOPY;  Surgeon: Jimmye Norman, MD;  [facility] OR;  Service: General;  Laterality: N/A;   SMALL INTESTINE SURGERY  07/09/2018   EXPLORATION OF  ABDOMINAL WOUND ERAS PATHWAYN/; MESH; LYSIS OF ADHESIONS   TOTAL ABDOMINAL HYSTERECTOMY  1990   TRANSFORAMINAL LUMBAR INTERBODY FUSION (TLIF) WITH PEDICLE SCREW FIXATION 2 LEVEL N/A 01/07/2020   Procedure: Lumbar Four-Five and Lumbar Five-Sacral One open lumbar decompression and transforaminal lumbar interbody fusion;  Surgeon: Jadene Pierini, MD;  Location: [facility] OR;  Service: Neurosurgery;  Laterality: N/A;  Lumbar Four-Five and Lumbar Five-Sacral One open lumbar decompression and transforaminal lumbar interbody fusion   TRIGGER FINGER RELEASE Right 05/213   TRIGGER  FINGER RELEASE Right 04/08/2019   Procedure: RIGHT INDEX RELEASE TRIGGER FINGER/A-1 PULLEY;  Surgeon: Mack Hook, MD;  Location: [facility] OR;  Service: Orthopedics;  Laterality: Right;  TUBAL LIGATION  1980s   VESICOVAGINAL FISTULA CLOSURE W/ TAH     Past Surgical History:  Procedure Laterality Date   APPENDECTOMY  07/13/2017   BLADDER SUSPENSION  1990   "w/hysterectomy"   BOWEL RESECTION N/A 07/09/2018   Procedure: SMALL BOWEL RESECTION;  Surgeon: Jimmye Norman, MD;  [facility];  Service: General;  Laterality: N/A;   CARDIAC CATHETERIZATION  2005   no obstructive CAD per patient   CARDIAC CATHETERIZATION  2018   CARDIAC DEFIBRILLATOR PLACEMENT  2005   BiV ICD implanted,  LV lead is an epicardial lead   CARPAL TUNNEL RELEASE Left 07/25/2014   Dr.Williamson    CARPAL TUNNEL RELEASE Right 04/08/2019   Procedure: RIGHT CARPAL TUNNEL RELEASE ENDOSCOPIC;  Surgeon: Mack Hook, MD;  Location: [facility] OR;  Service: Orthopedics;  Laterality: Right;   CATARACT EXTRACTION W/ INTRAOCULAR LENS  IMPLANT, BILATERAL Bilateral    COLONIC STENT PLACEMENT N/A 08/31/2017   Procedure: COLONIC STENT PLACEMENT;  Surgeon: Jeani Hawking, MD;  Location: [facility] ENDOSCOPY;  Service: Endoscopy;  Laterality: N/A;   COLONOSCOPY     2010-2011 Dr.Kipreos    COLOSTOMY  12/2016   Hattie Perch 01/26/2017   COLOSTOMY REVERSAL N/A 07/13/2017   Procedure: COLOSTOMY REVERSAL;  Surgeon: Jimmye Norman, MD;  Location[facility] OR;  Service: General;  Laterality: N/A;   CYST REMOVAL HAND Right 05/2013   thumb   DILATION AND CURETTAGE OF UTERUS  X 5-6   ESOPHAGOGASTRODUODENOSCOPY (EGD) WITH PROPOFOL N/A 03/13/2020   Procedure: ESOPHAGOGASTRODUODENOSCOPY (EGD) WITH PROPOFOL;  Surgeon: Jeani Hawking, MD;  [facility] ENDOSCOPY;  Service: Endoscopy;  Laterality: N/A;   EXCISION MASS ABDOMINAL N/A 07/09/2018   Procedure: EXPLORATION OF  ABDOMINAL WOUND ERAS PATHWAY;  Surgeon: Jimmye Norman, MD;  Location: [facility] OR;  Service: General;  Laterality: N/A;    FLEXIBLE SIGMOIDOSCOPY N/A 08/24/2017   Procedure: Arnell Sieving;  Surgeon: Jeani Hawking, MD;  Location: [facility] ENDOSCOPY;  Service: Endoscopy;  Laterality: N/A;   FLEXIBLE SIGMOIDOSCOPY N/A 08/31/2017   Procedure: FLEXIBLE SIGMOIDOSCOPY;  Surgeon: Jeani Hawking, MD;  Location: [facility] ENDOSCOPY;  Service: Endoscopy;  Laterality: N/A;  stent placement   FLEXIBLE SIGMOIDOSCOPY N/A 03/13/2020   Procedure: FLEXIBLE SIGMOIDOSCOPY;  Surgeon: Jeani Hawking, MD;  Location[facility] ENDOSCOPY;  Service: Endoscopy;  Laterality: N/A;   FLEXIBLE SIGMOIDOSCOPY N/A 12/18/2020   Procedure: FLEXIBLE SIGMOIDOSCOPY;  Surgeon: Jeani Hawking, MD;  Location: [facility] ENDOSCOPY;  Service: Endoscopy;  Laterality: N/A;   HEMOSTASIS CLIP PLACEMENT  03/13/2020   Procedure: HEMOSTASIS CLIP PLACEMENT;  Surgeon: Jeani Hawking, MD;  Location: [facility] ENDOSCOPY;  Service: Endoscopy;;   HEMOSTASIS CLIP PLACEMENT  12/18/2020   Procedure: HEMOSTASIS CLIP PLACEMENT;  Surgeon: Jeani Hawking, MD;  Location[facility] ENDOSCOPY;  Service: Endoscopy;;   HOT HEMOSTASIS N/A 03/13/2020   Procedure: HOT HEMOSTASIS (ARGON PLASMA COAGULATION/BICAP);  Surgeon: Jeani Hawking, MD;  Location: [facility] ENDOSCOPY;  Service: Endoscopy;  Laterality: N/A;   HOT HEMOSTASIS N/A 12/18/2020   Procedure: HOT HEMOSTASIS (ARGON PLASMA COAGULATION/BICAP);  Surgeon: Jeani Hawking, MD;  Location: [facility] ENDOSCOPY;  Service: Endoscopy;  Laterality: N/A;   IMPLANTABLE CARDIOVERTER DEFIBRILLATOR GENERATOR CHANGE  2008   INSERTION OF MESH N/A 07/09/2018   Procedure: INSERTION OF VICRYL MESH;  Surgeon: Jimmye Norman, MD;  Location: [facility] OR;  Service: General;  Laterality: N/A;   LAPAROTOMY N/A 09/18/2017   Procedure: EXPLORATORY LAPAROTOMY;  Surgeon: Jimmye Norman, MD;  Location: [facility] OR;  Service: General;  Laterality: N/A;   LEAD REVISION  06/22/2018   LEAD REVISION/REPAIR N/A 06/22/2018  Procedure: LEAD REVISION/REPAIR;  Surgeon: Duke Salvia, MD;  Location: [facility] INVASIVE CV LAB;  Service:  Cardiovascular;  Laterality: N/A;   LYSIS OF ADHESION N/A 09/18/2017   Procedure: LYSIS OF ADHESION;  Surgeon: Jimmye Norman, MD;  Location: [facility] OR;  Service: General;  Laterality: N/A;   LYSIS OF ADHESION N/A 07/09/2018   Procedure: LYSIS OF ADHESION;  Surgeon: Jimmye Norman, MD;  Location: [facility] OR;  Service: General;  Laterality: N/A;   PARTIAL COLECTOMY N/A 09/18/2017   Procedure: ILEOCOLECTOMY;  Surgeon: Jimmye Norman, MD;  Location: [facility] OR;  Service: General;  Laterality: N/A;   PARTIAL COLECTOMY  09/25/2017   POLYPECTOMY  03/13/2020   Procedure: POLYPECTOMY;  Surgeon: Jeani Hawking, MD;  Location[facility] ENDOSCOPY;  Service: Endoscopy;;   RIGHT/LEFT HEART CATH AND CORONARY ANGIOGRAPHY N/A 06/01/2018   Procedure: RIGHT/LEFT HEART CATH AND CORONARY ANGIOGRAPHY;  Surgeon: Corky Crafts, MD;  Location: [facility] INVASIVE CV LAB;  Service: Cardiovascular;  Laterality: N/A;   SIGMOIDOSCOPY N/A 09/18/2017   Procedure: SIGMOIDOSCOPY;  Surgeon: Jimmye Norman, MD;  Location[facility] OR;  Service: General;  Laterality: N/A;   SMALL INTESTINE SURGERY  07/09/2018   EXPLORATION OF  ABDOMINAL WOUND ERAS PATHWAYN/; MESH; LYSIS OF ADHESIONS   TOTAL ABDOMINAL HYSTERECTOMY  1990   TRANSFORAMINAL LUMBAR INTERBODY FUSION (TLIF) WITH PEDICLE SCREW FIXATION 2 LEVEL N/A 01/07/2020   Procedure: Lumbar Four-Five and Lumbar Five-Sacral One open lumbar decompression and transforaminal lumbar interbody fusion;  Surgeon: Jadene Pierini, MD;  Location: [facility] OR;  Service: Neurosurgery;  Laterality: N/A;  Lumbar Four-Five and Lumbar Five-Sacral One open lumbar decompression and transforaminal lumbar interbody fusion   TRIGGER FINGER RELEASE Right 05/213   TRIGGER FINGER RELEASE Right 04/08/2019   Procedure: RIGHT INDEX RELEASE TRIGGER FINGER/A-1 PULLEY;  Surgeon: Mack Hook, MD;  Location: [facility] OR;  Service: Orthopedics;  Laterality: Right;   TUBAL LIGATION  1980s   VESICOVAGINAL FISTULA CLOSURE W/ TAH     Past Medical History:   Diagnosis Date   AICD (automatic cardioverter/defibrillator) present    high RV threshold chronically, device was turned off in 2014; Device battery has been dead x 7 years; "turned it back on 06/22/2018"   Anemia, iron deficiency    Angioedema    felt to likely be due to ace inhibitors but says she has had this even off of medicines, appears to be tolerating ARBs chronically   Anxiety    Arthritis    "hands, legs, arms; bad in my back" (06/22/2018)   Asthmatic bronchitis with status asthmaticus    Bradycardia    CHF (congestive heart failure) (HCC)    Chronic lower back pain    Chronic pain    CKD (chronic kidney disease), stage III (HCC)    Coronary artery disease    Mild nonobstructive (30% LAD, 30% RCA) by 09/01/16 cath at [facility]   Degenerative joint disease    Depression    Diabetes mellitus, type 2 (HCC)    Diabetic peripheral neuropathy (HCC)    Dizziness    Dyspnea on exertion    Family history of adverse reaction to anesthesia    Mother has nausea   GERD (gastroesophageal reflux disease)    Gout    Heart valve disorder    History of blood transfusion 1981; ~ 2005; 12/2016   "childbirth; defibrillator OR; colostomy OR"   History of mononucleosis 03/2014   Hyperlipidemia    Hypertension    Hypothyroid    Insomnia    Intervertebral  disc degeneration    Ischemic cardiomyopathy    LBBB (left bundle branch block)    Myocardial infarction [facility]) dx'd ~ 2005   Nonischemic cardiomyopathy (HCC)    s/p ICD in 2005. EF has since recovered   Obesity    On home oxygen therapy    "2L at night" (06/22/2018)   OSA on CPAP    Pneumonia    "couple times; last time was 12/2016" (06/22/2018)   PONV (postoperative nausea and vomiting)    Presence of permanent cardiac pacemaker    [employer]   Swelling    Syncope    Systemic hypertension    Vitamin D deficiency    There were no vitals taken for this visit.  Opioid Risk Score:   Fall Risk Score:   `1  Depression screen [facility] 2/9     01/23/2023   11:30 AM 10/26/2022   11:03 AM 09/27/2022   11:03 AM 08/02/2022   11:22 AM 07/19/2022   11:27 AM 06/21/2022   10:01 AM 03/08/2022   10:19 AM  Depression screen PHQ 2/9  Decreased Interest 0 0 0 0 0 0 0  Down, Depressed, Hopeless 0 0 0 0 0 0 0  PHQ - 2 Score 0 0 0 0 0 0 0    Review of Systems  Musculoskeletal:  Positive for back pain and gait problem.       Pain in both knees, and left wrist  All other systems reviewed and are negative.     Objective:   Physical Exam Vitals and nursing note reviewed.  Constitutional:      Appearance: Normal appearance. She is obese.  Cardiovascular:     Rate and Rhythm: Normal rate and regular rhythm.     Pulses: Normal pulses.     Heart sounds: Normal heart sounds.  Pulmonary:     Effort: Pulmonary effort is normal.     Breath sounds: Normal breath sounds.  Musculoskeletal:     Cervical back: Normal range of motion and neck supple.     Comments: Normal Muscle Bulk and Muscle Testing Reveals:  Upper Extremities: Full ROM and Muscle Strength 5/5 Lumbar Paraspinal Tenderness: L-4-L-5 Lower Extremities: Full ROM and Muscle Strength 5/5 Arises from Chair slowly using a walker for support Narrow Based  Gait     Skin:    General: Skin is warm and dry.  Neurological:     Mental Status: She is alert and oriented to person, place, and time.  Psychiatric:        Mood and Affect: Mood normal.        Behavior: Behavior normal.         Assessment & Plan:  Chronic Low Back Pain: Encouraged to increase HEP as Tolerated. Continue to Monitor. 02/23/2023 2. Bilateral Primary Osteoarthritis:  S/P Right Knee Genicular nerve radiofrequency neurotomy, with Dr Wynn Banker. On 08/02/2022 and S/P Left Knee Genicular Nerve Radiofrequency on 08/16/2022. With good relief noted.  : Indication Chronic post operative pain in the Knee, pain postop total knee replacement which has not responded to conservative management  such as physical therapy and medication management; Continue monitor. 02/23/2023 3. Myofascial Pain: S/P Trigger Point Injection on 06/29/2021 with Good Relief Noted. 02/23/2023 4. Chronic Pain Syndrome: Refilled: Oxycodone 7.5mg  /325 mg one tablet three times a day as needed for pain #90, Discontinue Hydrocodone due to itching, Ms. Meeuwsen verbalizes understanding.  We will continue the opioid monitoring program, this consists of regular clinic visits, examinations, urine drug screen,  pill counts as well as use of [place] Controlled Substance Reporting system. A 12 month History has been reviewed on the [place] Controlled Substance Reporting System on 02/23/2023. 5. Polyarthralgia: Continue HEP as tolerated. Continue current medication regimen. Continue to monitor. 02/23/2023   F/U in 1 month

## 2023-02-27 ENCOUNTER — Ambulatory Visit: Payer: Self-pay

## 2023-02-28 ENCOUNTER — Telehealth: Payer: Self-pay | Admitting: Pulmonary Disease

## 2023-02-28 NOTE — Progress Notes (Signed)
[employer] PREP Weekly Session  Patient Details  Name: Crystal [name] MRN: [id number] Date of Birth: 26-Aug-1950 Age: 72 y.o. PCP: Rodrigo Ran, MD  Vitals:   02/27/23 1030  Weight: 254 lb (115.2 kg)     [employer] Weekly seesion - 02/28/23 1100       [employer] "PREP" Location   [facility] "PREP" Location [facility]      Weekly Session   Topic Discussed Importance of resistance training;Other ways to be active    Minutes exercised this week 90 minutes    Classes attended to date 2             Bonnye Fava 02/28/2023, 11:39 AM

## 2023-02-28 NOTE — Telephone Encounter (Signed)
Informed via staff message that patient Crystal [name] was denied by insurance.  First I learned of this.  Can you look into alternatives for ICS/LABA therapy?  Thank you exhalation point

## 2023-02-28 NOTE — Patient Instructions (Signed)
Visit Information  Thank you for taking time to visit with me today. Please don't hesitate to contact me if I can be of assistance to you.   Following are the goals we discussed today:   Goals Addressed               This Visit's Progress     Patient Stated     I would like to continue to improve my diabetes (pt-stated)        Care Coordination Interventions: Evaluation of current treatment plan related to type 2 diabetes and patient's adherence to plan as established by provider Review of patient status, including review of consultants reports, relevant laboratory and other test results, and medications completed Confirmed patient completed her initial intake for start of the PREP program, she is looking forward to establishing an exercise routine Counseled on Diabetic diet, my plate method, 161 minutes of moderate intensity exercise/week Positive reinforcement provided to patient for making efforts to improve her diabetes and overall health  Lab Results  Component Value Date   HGBA1C 8.5 (H) 10/27/2021        Other     To better manage swelling and shortness of breath        Care Coordination Interventions: Evaluation of current treatment plan related to nonischemic cardiomyopathy and patient's adherence to plan as established by provider Confirmed patient completed her initial follow up visit with Pulmonology for evaluation of shortness of breath as referred by Cardiology (see new goal) Review of patient status per Cardiology evaluation for nonischemic cardiomyopathy, including review of consultant's reports, relevant laboratory and other test results, and medications completed NICM  -Continue taking Demedex three days a week and Farxiga 10 mg daily Determined patient verbalizes understanding of her prescribed txt plan, discussed next Cardiology follow up is scheduled for 6 months  Instructed patient to notify her doctor of new symptoms or concerns       To improve shortness  of breath related to Asthma        Care Coordination Interventions: Provided patient with basic written and verbal Asthma education on self care/management/and exacerbation prevention Advised patient to track and manage Asthma triggers Provided written and verbal instructions on pursed lip breathing  Provided instruction about proper use of medications used for management of Asthma inhalers/nebulizers Discussed Pulmonologist ordered Breo Ellipta inhaler, unfortunately insurance denied, Pulmonologist will prescribe an alternative inhaler Discussed patient was given a Breo inhaler by her mother who has Asthma, it was unused, she is using twice daily until Pulmonology prescribes a new inhaler  Sent in basket message to Dr. Judeth Horn with update regarding patient usage of a Breo inhaler given to her by her mother         Our next appointment is by telephone on 05/29/23 at 11:00 AM  Please call the care guide team at [phone] if you need to cancel or reschedule your appointment.   If you are experiencing a Mental Health or Behavioral Health Crisis or need someone to talk to, please call 1-800-273-TALK (toll free, 24 hour hotline)  Patient verbalizes understanding of instructions and care plan provided today and agrees to view in MyChart. Active MyChart status and patient understanding of how to access instructions and care plan via MyChart confirmed with patient.     Delsa Sale, RN, BSN, CCM Care Management Coordinator [facility] Care Management Direct Phone: [phone]

## 2023-02-28 NOTE — Patient Outreach (Signed)
Care Coordination   Follow Up Visit Note   02/27/2023 Name: Crystal [name] MRN: [id number] DOB: 1950/09/22  Crystal [name] is a 72 y.o. year old female who sees Rodrigo Ran, MD for primary care. I spoke with  Crystal [name] by phone today.  What matters to the patients health and wellness today?   Patient would like to establish a new exercise regimen through the PREP program.      Goals Addressed               This Visit's Progress     Patient Stated     I would like to continue to improve my diabetes (pt-stated)        Care Coordination Interventions: Evaluation of current treatment plan related to type 2 diabetes and patient's adherence to plan as established by provider Review of patient status, including review of consultants reports, relevant laboratory and other test results, and medications completed Confirmed patient completed her initial intake for start of the PREP program, she is looking forward to establishing an exercise routine Counseled on Diabetic diet, my plate method, 829 minutes of moderate intensity exercise/week Positive reinforcement provided to patient for making efforts to improve her diabetes and overall health  Lab Results  Component Value Date   HGBA1C 8.5 (H) 10/27/2021       Other     To better manage swelling and shortness of breath        Care Coordination Interventions: Evaluation of current treatment plan related to nonischemic cardiomyopathy and patient's adherence to plan as established by provider Confirmed patient completed her initial follow up visit with Pulmonology for evaluation of shortness of breath as referred by Cardiology (see new goal) Review of patient status per Cardiology evaluation for nonischemic cardiomyopathy, including review of consultant's reports, relevant laboratory and other test results, and medications completed NICM  -Continue taking Demedex three days a week and Farxiga 10 mg daily Determined patient verbalizes  understanding of her prescribed txt plan, discussed next Cardiology follow up is scheduled for 6 months  Instructed patient to notify her doctor of new symptoms or concerns       To improve shortness of breath related to Asthma        Care Coordination Interventions: Provided patient with basic written and verbal Asthma education on self care/management/and exacerbation prevention Advised patient to track and manage Asthma triggers Provided written and verbal instructions on pursed lip breathing  Provided instruction about proper use of medications used for management of Asthma inhalers/nebulizers Discussed Pulmonologist ordered Breo Ellipta inhaler, unfortunately insurance denied, Pulmonologist will prescribe an alternative inhaler Discussed patient was given a Breo inhaler by her mother who has Asthma, it was unused, she is using twice daily until Pulmonology prescribes a new inhaler  Sent in basket message to Dr. Judeth Horn with update regarding patient usage of a Breo inhaler given to her by her mother     Interventions Today    Flowsheet Row Most Recent Value  Chronic Disease   Chronic disease during today's visit Diabetes, Other  [Asthma,  Cardiomyopathy]  General Interventions   General Interventions Discussed/Reviewed General Interventions Discussed, General Interventions Reviewed, Doctor Visits, Labs, Communication with  Doctor Visits Discussed/Reviewed PCP, Doctor Visits Reviewed, Doctor Visits Discussed  Communication with PCP/Specialists  [Dr. Hunsucker]  Exercise Interventions   Exercise Discussed/Reviewed Exercise Reviewed, Exercise Discussed, Physical Activity  Physical Activity Discussed/Reviewed Physical Activity Discussed, Physical Activity Reviewed, PREP  Education Interventions   Education Provided Provided Education  Provided Verbal Education On Nutrition, Labs, Blood Sugar Monitoring, Medication, When to see the doctor  Labs Reviewed Hgb A1c, Kidney Function   Nutrition Interventions   Nutrition Discussed/Reviewed Nutrition Discussed, Nutrition Reviewed, Carbohydrate meal planning, Portion sizes, Fluid intake  Pharmacy Interventions   Pharmacy Dicussed/Reviewed Pharmacy Topics Reviewed, Pharmacy Topics Discussed, Medications and their functions          SDOH assessments and interventions completed:  No     Care Coordination Interventions:  Yes, provided   Follow up plan: Follow up call scheduled for 05/29/23 @11 :00 AM    Encounter Outcome:  Pt. Visit Completed

## 2023-03-01 ENCOUNTER — Encounter: Payer: Self-pay | Admitting: Pulmonary Disease

## 2023-03-01 ENCOUNTER — Other Ambulatory Visit (HOSPITAL_COMMUNITY): Payer: Self-pay

## 2023-03-01 NOTE — Telephone Encounter (Signed)
At this time per test claims Virgel Bouquet is the only ICS/LABA covered at a co-pay of $242.10 due to patient being in the Deductible/Initial Benefit phase of their plan.

## 2023-03-07 DIAGNOSIS — Z1212 Encounter for screening for malignant neoplasm of rectum: Secondary | ICD-10-CM | POA: Diagnosis not present

## 2023-03-07 DIAGNOSIS — E039 Hypothyroidism, unspecified: Secondary | ICD-10-CM | POA: Diagnosis not present

## 2023-03-07 DIAGNOSIS — E1129 Type 2 diabetes mellitus with other diabetic kidney complication: Secondary | ICD-10-CM | POA: Diagnosis not present

## 2023-03-07 DIAGNOSIS — I130 Hypertensive heart and chronic kidney disease with heart failure and stage 1 through stage 4 chronic kidney disease, or unspecified chronic kidney disease: Secondary | ICD-10-CM | POA: Diagnosis not present

## 2023-03-07 DIAGNOSIS — D649 Anemia, unspecified: Secondary | ICD-10-CM | POA: Diagnosis not present

## 2023-03-07 DIAGNOSIS — N184 Chronic kidney disease, stage 4 (severe): Secondary | ICD-10-CM | POA: Diagnosis not present

## 2023-03-07 DIAGNOSIS — M109 Gout, unspecified: Secondary | ICD-10-CM | POA: Diagnosis not present

## 2023-03-07 DIAGNOSIS — I509 Heart failure, unspecified: Secondary | ICD-10-CM | POA: Diagnosis not present

## 2023-03-07 DIAGNOSIS — M8589 Other specified disorders of bone density and structure, multiple sites: Secondary | ICD-10-CM | POA: Diagnosis not present

## 2023-03-07 DIAGNOSIS — E785 Hyperlipidemia, unspecified: Secondary | ICD-10-CM | POA: Diagnosis not present

## 2023-03-09 NOTE — Progress Notes (Signed)
[facility] PREP Weekly Session  Patient Details  Name: Crystal [name] MRN: [id number] Date of Birth: Sep 11, 1950 Age: 72 y.o. PCP: Rodrigo Ran, MD  There were no vitals filed for this visit.   [facility] Weekly seesion - 03/09/23 1600       [facility] "PREP" Location   [facility] "PREP" Location [facility]      Weekly Session   Topic Discussed Healthy eating tips    Minutes exercised this week 160 minutes    Classes attended to date 4             Bonnye Fava 03/09/2023, 4:49 PM

## 2023-03-14 DIAGNOSIS — E039 Hypothyroidism, unspecified: Secondary | ICD-10-CM | POA: Diagnosis not present

## 2023-03-14 DIAGNOSIS — Z1212 Encounter for screening for malignant neoplasm of rectum: Secondary | ICD-10-CM | POA: Diagnosis not present

## 2023-03-14 DIAGNOSIS — F329 Major depressive disorder, single episode, unspecified: Secondary | ICD-10-CM | POA: Diagnosis not present

## 2023-03-14 DIAGNOSIS — D62 Acute posthemorrhagic anemia: Secondary | ICD-10-CM | POA: Diagnosis not present

## 2023-03-14 DIAGNOSIS — D649 Anemia, unspecified: Secondary | ICD-10-CM | POA: Diagnosis not present

## 2023-03-14 DIAGNOSIS — R0901 Asphyxia: Secondary | ICD-10-CM | POA: Diagnosis not present

## 2023-03-14 DIAGNOSIS — E1129 Type 2 diabetes mellitus with other diabetic kidney complication: Secondary | ICD-10-CM | POA: Diagnosis not present

## 2023-03-14 DIAGNOSIS — Z794 Long term (current) use of insulin: Secondary | ICD-10-CM | POA: Diagnosis not present

## 2023-03-14 DIAGNOSIS — N184 Chronic kidney disease, stage 4 (severe): Secondary | ICD-10-CM | POA: Diagnosis not present

## 2023-03-14 DIAGNOSIS — R82998 Other abnormal findings in urine: Secondary | ICD-10-CM | POA: Diagnosis not present

## 2023-03-14 DIAGNOSIS — E785 Hyperlipidemia, unspecified: Secondary | ICD-10-CM | POA: Diagnosis not present

## 2023-03-14 DIAGNOSIS — Z0000 Encounter for general adult medical examination without abnormal findings: Secondary | ICD-10-CM | POA: Diagnosis not present

## 2023-03-14 DIAGNOSIS — I10 Essential (primary) hypertension: Secondary | ICD-10-CM | POA: Diagnosis not present

## 2023-03-14 DIAGNOSIS — Z95810 Presence of automatic (implantable) cardiac defibrillator: Secondary | ICD-10-CM | POA: Diagnosis not present

## 2023-03-14 DIAGNOSIS — E1159 Type 2 diabetes mellitus with other circulatory complications: Secondary | ICD-10-CM | POA: Diagnosis not present

## 2023-03-14 DIAGNOSIS — I130 Hypertensive heart and chronic kidney disease with heart failure and stage 1 through stage 4 chronic kidney disease, or unspecified chronic kidney disease: Secondary | ICD-10-CM | POA: Diagnosis not present

## 2023-03-14 DIAGNOSIS — I509 Heart failure, unspecified: Secondary | ICD-10-CM | POA: Diagnosis not present

## 2023-03-14 NOTE — Progress Notes (Signed)
[facility] PREP Weekly Session  Patient Details  Name: Crystal [name] MRN: [id number] Date of Birth: [birth year] Age: 72 y.o. PCP: Rodrigo Ran, MD  Vitals:   03/13/23 1030  Weight: 250 lb (113.4 kg)     [facility] Weekly seesion - 03/14/23 1600       [facility] "PREP" Location   [facility]P" [facility]      Weekly Session   Topic Discussed Health habits    Minutes exercised this week 150 minutes    Classes attended to date 5             Bonnye Fava 03/14/2023, 4:49 PM

## 2023-03-21 ENCOUNTER — Other Ambulatory Visit (HOSPITAL_COMMUNITY): Payer: Self-pay | Admitting: *Deleted

## 2023-03-22 ENCOUNTER — Ambulatory Visit (HOSPITAL_COMMUNITY)
Admission: RE | Admit: 2023-03-22 | Discharge: 2023-03-22 | Disposition: A | Discharge status: Home / Self Care | Payer: Medicare Other | Source: Ambulatory Visit | Attending: Internal Medicine | Admitting: Internal Medicine

## 2023-03-22 DIAGNOSIS — D649 Anemia, unspecified: Secondary | ICD-10-CM | POA: Diagnosis not present

## 2023-03-22 MED ORDER — SODIUM CHLORIDE 0.9 % IV SOLN
510.0000 mg | INTRAVENOUS | Status: DC
  Administered 2023-03-22: 510 mg via INTRAVENOUS
  Filled 2023-03-22: qty 510

## 2023-03-24 ENCOUNTER — Ambulatory Visit (INDEPENDENT_AMBULATORY_CARE_PROVIDER_SITE_OTHER): Payer: Medicare Other

## 2023-03-24 DIAGNOSIS — N179 Acute kidney failure, unspecified: Secondary | ICD-10-CM | POA: Diagnosis not present

## 2023-03-24 DIAGNOSIS — I447 Left bundle-branch block, unspecified: Secondary | ICD-10-CM

## 2023-03-24 DIAGNOSIS — I428 Other cardiomyopathies: Secondary | ICD-10-CM | POA: Diagnosis not present

## 2023-03-28 LAB — CUP PACEART REMOTE DEVICE CHECK
Battery Remaining Longevity: 54 mo
Battery Remaining Percentage: 65 %
Brady Statistic RA Percent Paced: 0 %
Brady Statistic RV Percent Paced: 98 %
Date Time Interrogation Session: 20240830153700
HighPow Impedance: 68 Ohm
Implantable Lead Connection Status: 753985
Implantable Lead Connection Status: 753985
Implantable Lead Connection Status: 753985
Implantable Lead Implant Date: 20191129
Implantable Lead Implant Date: 20191129
Implantable Lead Implant Date: 20191129
Implantable Lead Location: 753858
Implantable Lead Location: 753859
Implantable Lead Location: 753860
Implantable Lead Model: 293
Implantable Lead Model: 4086
Implantable Lead Serial Number: 226691
Implantable Lead Serial Number: 440868
Implantable Pulse Generator Implant Date: 20191129
Lead Channel Impedance Value: 453 Ohm
Lead Channel Impedance Value: 519 Ohm
Lead Channel Impedance Value: 788 Ohm
Lead Channel Setting Pacing Amplitude: 2 V
Lead Channel Setting Pacing Amplitude: 2.5 V
Lead Channel Setting Pacing Amplitude: 3 V
Lead Channel Setting Pacing Pulse Width: 0.4 ms
Lead Channel Setting Pacing Pulse Width: 1 ms
Lead Channel Setting Sensing Sensitivity: 0.5 mV
Lead Channel Setting Sensing Sensitivity: 1 mV
Pulse Gen Serial Number: 227627

## 2023-03-28 NOTE — Progress Notes (Signed)
Remote ICD transmission.   

## 2023-03-29 ENCOUNTER — Encounter: Payer: Self-pay | Admitting: Registered Nurse

## 2023-03-29 ENCOUNTER — Encounter: Payer: Medicare Other | Attending: Registered Nurse | Admitting: Registered Nurse

## 2023-03-29 VITALS — BP 131/69 | HR 78 | Ht 64.0 in | Wt 249.2 lb

## 2023-03-29 DIAGNOSIS — M7918 Myalgia, other site: Secondary | ICD-10-CM

## 2023-03-29 DIAGNOSIS — M542 Cervicalgia: Secondary | ICD-10-CM | POA: Diagnosis not present

## 2023-03-29 DIAGNOSIS — Z5181 Encounter for therapeutic drug level monitoring: Secondary | ICD-10-CM

## 2023-03-29 DIAGNOSIS — Z79891 Long term (current) use of opiate analgesic: Secondary | ICD-10-CM

## 2023-03-29 DIAGNOSIS — M2550 Pain in unspecified joint: Secondary | ICD-10-CM

## 2023-03-29 DIAGNOSIS — M545 Low back pain, unspecified: Secondary | ICD-10-CM

## 2023-03-29 DIAGNOSIS — G894 Chronic pain syndrome: Secondary | ICD-10-CM

## 2023-03-29 DIAGNOSIS — G8929 Other chronic pain: Secondary | ICD-10-CM | POA: Insufficient documentation

## 2023-03-29 DIAGNOSIS — M170 Bilateral primary osteoarthritis of knee: Secondary | ICD-10-CM | POA: Diagnosis not present

## 2023-03-29 MED ORDER — OXYCODONE-ACETAMINOPHEN 7.5-325 MG PO TABS: 1.0000 | ORAL_TABLET | Freq: Three times a day (TID) | ORAL | 0 refills | Status: DC | PRN

## 2023-03-29 NOTE — Progress Notes (Signed)
Subjective:    Patient ID: Crystal [name], female    DOB: Jun 04, 1951, 72 y.o.   MRN: [id number]  HPI: Crystal [name] is a 72 y.o. female who returns for follow up appointment for chronic pain and medication refill. She states her pain is located in her neck, lower back pain and bilateral knee pain. She rates her pain 6. Her current exercise regime is walking, [employer] two days a week and performing stretching exercises.  Ms. Sharratt Morphine equivalent is 33.75 MME.   Oral Swab Performed today.    Pain Inventory Average Pain 9 Pain Right Now 6 My pain is sharp, burning, stabbing, and aching  In the last 24 hours, has pain interfered with the following? General activity 8 Relation with others 6 Enjoyment of life 8 What TIME of day is your pain at its worst? morning , evening, and night Sleep (in general) Good  Pain is worse with: walking, bending, standing, and some activites Pain improves with: rest, medication, and injections Relief from Meds: 8  Family History  Problem Relation Age of Onset   Hypertension Father    Heart disease Father    Cancer Father        prostate   Heart disease Other        (Maternal side) Ischemic heart disease   Diabetes Mellitus II Other    Arrhythmia Mother    Diabetes Mellitus II Mother        Borderline DM   Hypertension Mother    Asthma Mother    Cancer Brother    Dementia Brother    Hypertension Brother    Hypertension Daughter    Hypertension Daughter    Diabetes Daughter    Hypertension Daughter    Atrial fibrillation Daughter    GER disease Daughter    Hypertension Son    Anxiety disorder Son    Hypothyroidism Son    Breast cancer Maternal Grandmother        in her 61's   Social History   Socioeconomic History   Marital status: Widowed    Spouse name: Not on file   Number of children: Not on file   Years of education: Not on file   Highest education level: Not on file  Occupational History    Comment: retired LPN  Tobacco  Use   Smoking status: Never   Smokeless tobacco: Never  Vaping Use   Vaping status: Never Used  Substance and Sexual Activity   Alcohol use: Not Currently   Drug use: Never   Sexual activity: Not Currently  Other Topics Concern   Not on file  Social History Narrative   [place]  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      As of 03/15/17:   Diet: 1800 Calorie      Caffeine: Yes      Married, if yes what year: Widowed, married 1973      Do you live in a house, apartment, assisted living, condo, trailer, ect: House, 1 stories, and 1 person      Pets: No      Current/Past profession: LPN, retired       Exercise: Yes, walking          Living Will: Yes   DNR: No   POA/HPOA: Yes      Functional Status:   Do you have difficulty bathing or dressing yourself? No  Do you have difficulty preparing food or eating? No   Do you have difficulty managing your medications? No   Do you have difficulty managing your finances? No   Do you have difficulty affording your medications? Yes   Social Determinants of Health   Financial Resource Strain: Low Risk  (10/11/2017)   Overall Financial Resource Strain (CARDIA)    Difficulty of Paying Living Expenses: Not hard at all  Food Insecurity: No Food Insecurity (01/03/2022)   Hunger Vital Sign    Worried About Running Out of Food in the Last Year: Never true    Ran Out of Food in the Last Year: Never true  Transportation Needs: No Transportation Needs (01/03/2022)   PRAPARE - Administrator, Civil Service (Medical): No    Lack of Transportation (Non-Medical): No  Physical Activity: Inactive (10/11/2017)   Exercise Vital Sign    Days of Exercise per Week: 0 days    Minutes of Exercise per Session: 0 min  Stress: Stress Concern Present (10/11/2017)   [name] - Occupational Stress Questionnaire    Feeling of Stress : To some extent   Social Connections: Somewhat Isolated (10/11/2017)   Social Connection and Isolation Panel [NHANES]    Frequency of Communication with Friends and Family: More than three times a week    Frequency of Social Gatherings with Friends and Family: More than three times a week    Attends Religious Services: More than 4 times per year    Active Member of [employer] or Organizations: No    Attends Banker Meetings: Never    Marital Status: Widowed   Past Surgical History:  Procedure Laterality Date   APPENDECTOMY  07/13/2017   BLADDER SUSPENSION  1990   "w/hysterectomy"   BOWEL RESECTION N/A 07/09/2018   Procedure: SMALL BOWEL RESECTION;  Surgeon: Jimmye Norman, MD;  [facility];  Service: General;  Laterality: N/A;   CARDIAC CATHETERIZATION  2005   no obstructive CAD per patient   CARDIAC CATHETERIZATION  2018   CARDIAC DEFIBRILLATOR PLACEMENT  2005   BiV ICD implanted,  LV lead is an epicardial lead   CARPAL TUNNEL RELEASE Left 07/25/2014   Dr.Williamson    CARPAL TUNNEL RELEASE Right 04/08/2019   Procedure: RIGHT CARPAL TUNNEL RELEASE ENDOSCOPIC;  Surgeon: Mack Hook, MD;  Location: [facility] OR;  Service: Orthopedics;  Laterality: Right;   CATARACT EXTRACTION W/ INTRAOCULAR LENS  IMPLANT, BILATERAL Bilateral    COLONIC STENT PLACEMENT N/A 08/31/2017   Procedure: COLONIC STENT PLACEMENT;  Surgeon: Jeani Hawking, MD;  Location: [facility] ENDOSCOPY;  Service: Endoscopy;  Laterality: N/A;   COLONOSCOPY     2010-2011 Dr.Kipreos    COLOSTOMY  12/2016   Hattie Perch 01/26/2017   COLOSTOMY REVERSAL N/A 07/13/2017   Procedure: COLOSTOMY REVERSAL;  Surgeon: Jimmye Norman, MD;  [facility];  Service: General;  Laterality: N/A;   CYST REMOVAL HAND Right 05/2013   thumb   DILATION AND CURETTAGE OF UTERUS  X 5-6   ESOPHAGOGASTRODUODENOSCOPY (EGD) WITH PROPOFOL N/A 03/13/2020   Procedure: ESOPHAGOGASTRODUODENOSCOPY (EGD) WITH PROPOFOL;  Surgeon: Jeani Hawking, MD;  [facility] ENDOSCOPY;  Service:  Endoscopy;  Laterality: N/A;   EXCISION MASS ABDOMINAL N/A 07/09/2018   Procedure: EXPLORATION OF  ABDOMINAL WOUND ERAS PATHWAY;  Surgeon: Jimmye Norman, MD;  Location: [facility] OR;  Service: General;  Laterality: N/A;   FLEXIBLE SIGMOIDOSCOPY N/A 08/24/2017   Procedure: Arnell Sieving;  Surgeon: Jeani Hawking, MD;  Location: [facility] ENDOSCOPY;  Service: Endoscopy;  Laterality: N/A;   FLEXIBLE SIGMOIDOSCOPY N/A 08/31/2017   Procedure: FLEXIBLE SIGMOIDOSCOPY;  Surgeon: Jeani Hawking, MD;  Location: [facility] ENDOSCOPY;  Service: Endoscopy;  Laterality: N/A;  stent placement   FLEXIBLE SIGMOIDOSCOPY N/A 03/13/2020   Procedure: FLEXIBLE SIGMOIDOSCOPY;  Surgeon: Jeani Hawking, MD;  [facility] ENDOSCOPY;  Service: Endoscopy;  Laterality: N/A;   FLEXIBLE SIGMOIDOSCOPY N/A 12/18/2020   Procedure: FLEXIBLE SIGMOIDOSCOPY;  Surgeon: Jeani Hawking, MD;  [facility] ENDOSCOPY;  Service: Endoscopy;  Laterality: N/A;   HEMOSTASIS CLIP PLACEMENT  03/13/2020   Procedure: HEMOSTASIS CLIP PLACEMENT;  Surgeon: Jeani Hawking, MD;  [facility] ENDOSCOPY;  Service: Endoscopy;;   HEMOSTASIS CLIP PLACEMENT  12/18/2020   Procedure: HEMOSTASIS CLIP PLACEMENT;  Surgeon: Jeani Hawking, MD;  [facility] ENDOSCOPY;  Service: Endoscopy;;   HOT HEMOSTASIS N/A 03/13/2020   Procedure: HOT HEMOSTASIS (ARGON PLASMA COAGULATION/BICAP);  Surgeon: Jeani Hawking, MD;  Location: [facility] ENDOSCOPY;  Service: Endoscopy;  Laterality: N/A;   HOT HEMOSTASIS N/A 12/18/2020   Procedure: HOT HEMOSTASIS (ARGON PLASMA COAGULATION/BICAP);  Surgeon: Jeani Hawking, MD;  Location: [facility] ENDOSCOPY;  Service: Endoscopy;  Laterality: N/A;   IMPLANTABLE CARDIOVERTER DEFIBRILLATOR GENERATOR CHANGE  2008   INSERTION OF MESH N/A 07/09/2018   Procedure: INSERTION OF VICRYL MESH;  Surgeon: Jimmye Norman, MD;  [facility] OR;  Service: General;  Laterality: N/A;   LAPAROTOMY N/A 09/18/2017   Procedure: EXPLORATORY LAPAROTOMY;  Surgeon: Jimmye Norman, MD;  Location: [facility] OR;  Service:  General;  Laterality: N/A;   LEAD REVISION  06/22/2018   LEAD REVISION/REPAIR N/A 06/22/2018   Procedure: LEAD REVISION/REPAIR;  Surgeon: Duke Salvia, MD;  Location: [facility] INVASIVE CV LAB;  Service: Cardiovascular;  Laterality: N/A;   LYSIS OF ADHESION N/A 09/18/2017   Procedure: LYSIS OF ADHESION;  Surgeon: Jimmye Norman, MD;  Location: [facility] OR;  Service: General;  Laterality: N/A;   LYSIS OF ADHESION N/A 07/09/2018   Procedure: LYSIS OF ADHESION;  Surgeon: Jimmye Norman, MD;  Location: [facility] OR;  Service: General;  Laterality: N/A;   PARTIAL COLECTOMY N/A 09/18/2017   Procedure: ILEOCOLECTOMY;  Surgeon: Jimmye Norman, MD;  Location: [facility] OR;  Service: General;  Laterality: N/A;   PARTIAL COLECTOMY  09/25/2017   POLYPECTOMY  03/13/2020   Procedure: POLYPECTOMY;  Surgeon: Jeani Hawking, MD;  [facility] ENDOSCOPY;  Service: Endoscopy;;   RIGHT/LEFT HEART CATH AND CORONARY ANGIOGRAPHY N/A 06/01/2018   Procedure: RIGHT/LEFT HEART CATH AND CORONARY ANGIOGRAPHY;  Surgeon: Corky Crafts, MD;  Location: [facility] INVASIVE CV LAB;  Service: Cardiovascular;  Laterality: N/A;   SIGMOIDOSCOPY N/A 09/18/2017   Procedure: SIGMOIDOSCOPY;  Surgeon: Jimmye Norman, MD;  [facility];  Service: General;  Laterality: N/A;   SMALL INTESTINE SURGERY  07/09/2018   EXPLORATION OF  ABDOMINAL WOUND ERAS PATHWAYN/; MESH; LYSIS OF ADHESIONS   TOTAL ABDOMINAL HYSTERECTOMY  1990   TRANSFORAMINAL LUMBAR INTERBODY FUSION (TLIF) WITH PEDICLE SCREW FIXATION 2 LEVEL N/A 01/07/2020   Procedure: Lumbar Four-Five and Lumbar Five-Sacral One open lumbar decompression and transforaminal lumbar interbody fusion;  Surgeon: Jadene Pierini, MD;  [facility] OR;  Service: Neurosurgery;  Laterality: N/A;  Lumbar Four-Five and Lumbar Five-Sacral One open lumbar decompression and transforaminal lumbar interbody fusion   TRIGGER FINGER RELEASE Right 05/213   TRIGGER FINGER RELEASE Right 04/08/2019   Procedure: RIGHT INDEX RELEASE TRIGGER  FINGER/A-1 PULLEY;  Surgeon: Mack Hook, MD;  Location: [facility] OR;  Service: Orthopedics;  Laterality: Right;   TUBAL LIGATION  1980s   VESICOVAGINAL  FISTULA CLOSURE W/ TAH     Past Surgical History:  Procedure Laterality Date   APPENDECTOMY  07/13/2017   BLADDER SUSPENSION  1990   "w/hysterectomy"   BOWEL RESECTION N/A 07/09/2018   Procedure: SMALL BOWEL RESECTION;  Surgeon: Jimmye Norman, MD;  [facility];  Service: General;  Laterality: N/A;   CARDIAC CATHETERIZATION  2005   no obstructive CAD per patient   CARDIAC CATHETERIZATION  2018   CARDIAC DEFIBRILLATOR PLACEMENT  2005   BiV ICD implanted,  LV lead is an epicardial lead   CARPAL TUNNEL RELEASE Left 07/25/2014   Dr.Williamson    CARPAL TUNNEL RELEASE Right 04/08/2019   Procedure: RIGHT CARPAL TUNNEL RELEASE ENDOSCOPIC;  Surgeon: Mack Hook, MD;  Location: [facility] OR;  Service: Orthopedics;  Laterality: Right;   CATARACT EXTRACTION W/ INTRAOCULAR LENS  IMPLANT, BILATERAL Bilateral    COLONIC STENT PLACEMENT N/A 08/31/2017   Procedure: COLONIC STENT PLACEMENT;  Surgeon: Jeani Hawking, MD;  Location: [facility] ENDOSCOPY;  Service: Endoscopy;  Laterality: N/A;   COLONOSCOPY     2010-2011 Dr.Kipreos    COLOSTOMY  12/2016   Hattie Perch 01/26/2017   COLOSTOMY REVERSAL N/A 07/13/2017   Procedure: COLOSTOMY REVERSAL;  Surgeon: Jimmye Norman, MD;  [facility] OR;  Service: General;  Laterality: N/A;   CYST REMOVAL HAND Right 05/2013   thumb   DILATION AND CURETTAGE OF UTERUS  X 5-6   ESOPHAGOGASTRODUODENOSCOPY (EGD) WITH PROPOFOL N/A 03/13/2020   Procedure: ESOPHAGOGASTRODUODENOSCOPY (EGD) WITH PROPOFOL;  Surgeon: Jeani Hawking, MD;  [facility] ENDOSCOPY;  Service: Endoscopy;  Laterality: N/A;   EXCISION MASS ABDOMINAL N/A 07/09/2018   Procedure: EXPLORATION OF  ABDOMINAL WOUND ERAS PATHWAY;  Surgeon: Jimmye Norman, MD;  Location: [facility] OR;  Service: General;  Laterality: N/A;   FLEXIBLE SIGMOIDOSCOPY N/A 08/24/2017   Procedure: Arnell Sieving;  Surgeon: Jeani Hawking, MD;  Location: [facility] ENDOSCOPY;  Service: Endoscopy;  Laterality: N/A;   FLEXIBLE SIGMOIDOSCOPY N/A 08/31/2017   Procedure: FLEXIBLE SIGMOIDOSCOPY;  Surgeon: Jeani Hawking, MD;  Location: [facility] ENDOSCOPY;  Service: Endoscopy;  Laterality: N/A;  stent placement   FLEXIBLE SIGMOIDOSCOPY N/A 03/13/2020   Procedure: FLEXIBLE SIGMOIDOSCOPY;  Surgeon: Jeani Hawking, MD;  [facility] ENDOSCOPY;  Service: Endoscopy;  Laterality: N/A;   FLEXIBLE SIGMOIDOSCOPY N/A 12/18/2020   Procedure: FLEXIBLE SIGMOIDOSCOPY;  Surgeon: Jeani Hawking, MD;  [facility] ENDOSCOPY;  Service: Endoscopy;  Laterality: N/A;   HEMOSTASIS CLIP PLACEMENT  03/13/2020   Procedure: HEMOSTASIS CLIP PLACEMENT;  Surgeon: Jeani Hawking, MD;  Location[facility] ENDOSCOPY;  Service: Endoscopy;;   HEMOSTASIS CLIP PLACEMENT  12/18/2020   Procedure: HEMOSTASIS CLIP PLACEMENT;  Surgeon: Jeani Hawking, MD;  Location[facility] ENDOSCOPY;  Service: Endoscopy;;   HOT HEMOSTASIS N/A 03/13/2020   Procedure: HOT HEMOSTASIS (ARGON PLASMA COAGULATION/BICAP);  Surgeon: Jeani Hawking, MD;  Location: [facility] ENDOSCOPY;  Service: Endoscopy;  Laterality: N/A;   HOT HEMOSTASIS N/A 12/18/2020   Procedure: HOT HEMOSTASIS (ARGON PLASMA COAGULATION/BICAP);  Surgeon: Jeani Hawking, MD;  Location: [facility] ENDOSCOPY;  Service: Endoscopy;  Laterality: N/A;   IMPLANTABLE CARDIOVERTER DEFIBRILLATOR GENERATOR CHANGE  2008   INSERTION OF MESH N/A 07/09/2018   Procedure: INSERTION OF VICRYL MESH;  Surgeon: Jimmye Norman, MD;  Location: [facility] OR;  Service: General;  Laterality: N/A;   LAPAROTOMY N/A 09/18/2017   Procedure: EXPLORATORY LAPAROTOMY;  Surgeon: Jimmye Norman, MD;  Location: [facility] OR;  Service: General;  Laterality: N/A;   LEAD REVISION  06/22/2018   LEAD REVISION/REPAIR N/A 06/22/2018   Procedure: LEAD REVISION/REPAIR;  Surgeon:  Duke Salvia, MD;  Location: [facility] INVASIVE CV LAB;  Service: Cardiovascular;  Laterality: N/A;   LYSIS OF ADHESION N/A 09/18/2017    Procedure: LYSIS OF ADHESION;  Surgeon: Jimmye Norman, MD;  Location: [facility] OR;  Service: General;  Laterality: N/A;   LYSIS OF ADHESION N/A 07/09/2018   Procedure: LYSIS OF ADHESION;  Surgeon: Jimmye Norman, MD;  Location: [facility] OR;  Service: General;  Laterality: N/A;   PARTIAL COLECTOMY N/A 09/18/2017   Procedure: ILEOCOLECTOMY;  Surgeon: Jimmye Norman, MD;  Location: [facility] OR;  Service: General;  Laterality: N/A;   PARTIAL COLECTOMY  09/25/2017   POLYPECTOMY  03/13/2020   Procedure: POLYPECTOMY;  Surgeon: Jeani Hawking, MD;  Location[facility] ENDOSCOPY;  Service: Endoscopy;;   RIGHT/LEFT HEART CATH AND CORONARY ANGIOGRAPHY N/A 06/01/2018   Procedure: RIGHT/LEFT HEART CATH AND CORONARY ANGIOGRAPHY;  Surgeon: Corky Crafts, MD;  Location: [facility] INVASIVE CV LAB;  Service: Cardiovascular;  Laterality: N/A;   SIGMOIDOSCOPY N/A 09/18/2017   Procedure: SIGMOIDOSCOPY;  Surgeon: Jimmye Norman, MD;  Location[facility] OR;  Service: General;  Laterality: N/A;   SMALL INTESTINE SURGERY  07/09/2018   EXPLORATION OF  ABDOMINAL WOUND ERAS PATHWAYN/; MESH; LYSIS OF ADHESIONS   TOTAL ABDOMINAL HYSTERECTOMY  1990   TRANSFORAMINAL LUMBAR INTERBODY FUSION (TLIF) WITH PEDICLE SCREW FIXATION 2 LEVEL N/A 01/07/2020   Procedure: Lumbar Four-Five and Lumbar Five-Sacral One open lumbar decompression and transforaminal lumbar interbody fusion;  Surgeon: Jadene Pierini, MD;  Location: [facility] OR;  Service: Neurosurgery;  Laterality: N/A;  Lumbar Four-Five and Lumbar Five-Sacral One open lumbar decompression and transforaminal lumbar interbody fusion   TRIGGER FINGER RELEASE Right 05/213   TRIGGER FINGER RELEASE Right 04/08/2019   Procedure: RIGHT INDEX RELEASE TRIGGER FINGER/A-1 PULLEY;  Surgeon: Mack Hook, MD;  Location: [facility] OR;  Service: Orthopedics;  Laterality: Right;   TUBAL LIGATION  1980s   VESICOVAGINAL FISTULA CLOSURE W/ TAH     Past Medical History:  Diagnosis Date   AICD (automatic cardioverter/defibrillator) present     high RV threshold chronically, device was turned off in 2014; Device battery has been dead x 7 years; "turned it back on 06/22/2018"   Anemia, iron deficiency    Angioedema    felt to likely be due to ace inhibitors but says she has had this even off of medicines, appears to be tolerating ARBs chronically   Anxiety    Arthritis    "hands, legs, arms; bad in my back" (06/22/2018)   Asthmatic bronchitis with status asthmaticus    Bradycardia    CHF (congestive heart failure) (HCC)    Chronic lower back pain    Chronic pain    CKD (chronic kidney disease), stage III (HCC)    Coronary artery disease    Mild nonobstructive (30% LAD, 30% RCA) by 09/01/16 cath at [facility]   Degenerative joint disease    Depression    Diabetes mellitus, type 2 (HCC)    Diabetic peripheral neuropathy (HCC)    Dizziness    Dyspnea on exertion    Family history of adverse reaction to anesthesia    Mother has nausea   GERD (gastroesophageal reflux disease)    Gout    Heart valve disorder    History of blood transfusion 1981; ~ 2005; 12/2016   "childbirth; defibrillator OR; colostomy OR"   History of mononucleosis 03/2014   Hyperlipidemia    Hypertension    Hypothyroid    Insomnia    Intervertebral disc degeneration  Ischemic cardiomyopathy    LBBB (left bundle branch block)    Myocardial infarction [facility]) dx'd ~ 2005   Nonischemic cardiomyopathy (HCC)    s/p ICD in 2005. EF has since recovered   Obesity    On home oxygen therapy    "2L at night" (06/22/2018)   OSA on CPAP    Pneumonia    "couple times; last time was 12/2016" (06/22/2018)   PONV (postoperative nausea and vomiting)    Presence of permanent cardiac pacemaker    [employer]   Swelling    Syncope    Systemic hypertension    Vitamin D deficiency    BP 131/69   Pulse 78   Ht 5\' 4"  (1.626 m)   Wt 249 lb 3.2 oz (113 kg)   SpO2 92%   BMI 42.78 kg/m   Opioid Risk Score:   Fall Risk Score:  `1  Depression screen  [facility] 2/9     03/29/2023   11:15 AM 02/23/2023   10:54 AM 02/23/2023   10:47 AM 01/23/2023   11:30 AM 10/26/2022   11:03 AM 09/27/2022   11:03 AM 08/02/2022   11:22 AM  Depression screen PHQ 2/9  Decreased Interest 0 0 0 0 0 0 0  Down, Depressed, Hopeless 0 0 0 0 0 0 0  PHQ - 2 Score 0 0 0 0 0 0 0      Review of Systems  Musculoskeletal:  Positive for back pain and gait problem.       B/L knee pain Left wrist pain   All other systems reviewed and are negative.     Objective:   Physical Exam Vitals and nursing note reviewed.  Constitutional:      Appearance: Normal appearance.  Cardiovascular:     Rate and Rhythm: Normal rate and regular rhythm.     Pulses: Normal pulses.     Heart sounds: Normal heart sounds.  Pulmonary:     Effort: Pulmonary effort is normal.     Breath sounds: Normal breath sounds.  Musculoskeletal:     Cervical back: Normal range of motion and neck supple.     Comments: Normal Muscle Bulk and Muscle Testing Reveals:  Upper Extremities: Full ROM and Muscle Strength 5/5 Lumbar Paraspinal Tenderness: L-4-L-5 Lower Extremities: Full ROM and Muscle Strength 5/5 Arises from Table slowly using walker  for support Narrow Based  Gait     Skin:    General: Skin is warm and dry.  Neurological:     Mental Status: She is alert and oriented to person, place, and time.  Psychiatric:        Mood and Affect: Mood normal.        Behavior: Behavior normal.         Assessment & Plan:  Chronic Low Back Pain: Encouraged to increase HEP as Tolerated. Continue to Monitor. 03/29/2023 2. Bilateral Primary Osteoarthritis:  S/P Right Knee Genicular nerve radiofrequency neurotomy, with Dr Wynn Banker. On 08/02/2022 and S/P Left Knee Genicular Nerve Radiofrequency on 08/16/2022. With good relief noted.  : Indication Chronic post operative pain in the Knee, pain postop total knee replacement which has not responded to conservative management such as physical therapy and medication  management; Continue monitor. 03/29/2023 3. Myofascial Pain: S/P Trigger Point Injection on 06/29/2021 with Good Relief Noted. 03/29/2023 4. Chronic Pain Syndrome: Refilled: Oxycodone 7.5mg  /325 mg one tablet three times a day as needed for pain #90, Discontinue Hydrocodone due to itching, Ms. Rushlow verbalizes understanding.  We will continue the opioid monitoring program, this consists of regular clinic visits, examinations, urine drug screen, pill counts as well as use of [place] Controlled Substance Reporting system. A 12 month History has been reviewed on the [place] Controlled Substance Reporting System on 03/29/2023. 5. Polyarthralgia: Continue HEP as tolerated. Continue current medication regimen. Continue to monitor. 03/29/2023   F/U in 1 month

## 2023-03-30 ENCOUNTER — Encounter: Payer: Medicare Other | Admitting: Registered Nurse

## 2023-03-30 ENCOUNTER — Ambulatory Visit (HOSPITAL_COMMUNITY)
Admission: RE | Admit: 2023-03-30 | Discharge: 2023-03-30 | Disposition: A | Discharge status: Home / Self Care | Payer: Medicare Other | Source: Ambulatory Visit | Attending: Internal Medicine | Admitting: Internal Medicine

## 2023-03-30 DIAGNOSIS — D649 Anemia, unspecified: Secondary | ICD-10-CM | POA: Diagnosis not present

## 2023-03-30 MED ORDER — SODIUM CHLORIDE 0.9 % IV SOLN
510.0000 mg | INTRAVENOUS | Status: DC
  Administered 2023-03-30: 510 mg via INTRAVENOUS
  Filled 2023-03-30: qty 510

## 2023-04-01 LAB — DRUG TOX MONITOR 1 W/CONF, ORAL FLD
Amphetamines: NEGATIVE ng/mL (ref <10)
Barbiturates: NEGATIVE ng/mL (ref <10)
Benzodiazepines: NEGATIVE ng/mL (ref <0.50)
Buprenorphine: NEGATIVE ng/mL (ref <0.10)
Cocaine: NEGATIVE ng/mL (ref <5.0)
Codeine: NEGATIVE ng/mL (ref <2.5)
Dihydrocodeine: NEGATIVE ng/mL (ref <2.5)
Fentanyl: NEGATIVE ng/mL (ref <0.10)
Heroin Metabolite: NEGATIVE ng/mL (ref <1.0)
Hydrocodone: NEGATIVE ng/mL (ref <2.5)
Hydromorphone: NEGATIVE ng/mL (ref <2.5)
MARIJUANA: NEGATIVE ng/mL (ref <2.5)
MDMA: NEGATIVE ng/mL (ref <10)
Meprobamate: NEGATIVE ng/mL (ref <2.5)
Methadone: NEGATIVE ng/mL (ref <5.0)
Morphine: NEGATIVE ng/mL (ref <2.5)
Nicotine Metabolite: NEGATIVE ng/mL (ref <5.0)
Norhydrocodone: NEGATIVE ng/mL (ref <2.5)
Noroxycodone: NEGATIVE ng/mL (ref <2.5)
Opiates: POSITIVE ng/mL — AB (ref <2.5)
Oxycodone: 34.2 ng/mL — ABNORMAL HIGH (ref <2.5)
Oxymorphone: NEGATIVE ng/mL (ref <2.5)
Phencyclidine: NEGATIVE ng/mL (ref <10)
Tapentadol: NEGATIVE ng/mL (ref <5.0)
Tramadol: NEGATIVE ng/mL (ref <5.0)
Zolpidem: NEGATIVE ng/mL (ref <5.0)

## 2023-04-01 LAB — DRUG TOX ALC METAB W/CON, ORAL FLD: Alcohol Metabolite: NEGATIVE ng/mL (ref <25)

## 2023-04-04 NOTE — Progress Notes (Signed)
[employer] PRE[facility] Weekly Session  Patient Details  Name: Crystal [name] MRN: [id number] Date of Birth: 12-04-1950 Age: 72 y.o. PCP: Rodrigo Ran, MD  Vitals:   04/03/23 1030  Weight: 249 lb (112.9 kg)     [employer] Weekly seesion - 04/04/23 1100       [facility] "PREP" Location   [facility]PREP" Location [facility]      Weekly Session   Topic Discussed Stress management and problem solving    Minutes exercised this week 150 minutes    Classes attended to date 86             Pam Jerral Bonito 04/04/2023, 11:36 AM

## 2023-04-05 ENCOUNTER — Encounter: Payer: Self-pay | Admitting: Registered Nurse

## 2023-04-24 NOTE — Progress Notes (Signed)
[employer] PREP Weekly Session  Patient Details  Name: SAMREEN SELTZER MRN: [id number] Date of Birth: [birth year] Age: 72 y.o. PCP: Rodrigo Ran, MD  Vitals:   04/24/23 1030  Weight: 254 lb (115.2 kg)     [employer] Weekly seesion - 04/24/23 1300       [facility] "PREP" Location   [facility]" [facility]      Weekly Session   Topic Discussed Eating for the season;Water   Review of health habits   Minutes exercised this week 160 minutes    Classes attended to date 64             Pam Jerral Bonito 04/24/2023, 1:17 PM

## 2023-04-28 ENCOUNTER — Encounter: Payer: Self-pay | Admitting: Registered Nurse

## 2023-04-28 ENCOUNTER — Encounter: Payer: Medicare Other | Attending: Registered Nurse | Admitting: Registered Nurse

## 2023-04-28 VITALS — BP 107/65 | HR 75 | Ht 64.0 in | Wt 251.6 lb

## 2023-04-28 DIAGNOSIS — N184 Chronic kidney disease, stage 4 (severe): Secondary | ICD-10-CM | POA: Diagnosis not present

## 2023-04-28 DIAGNOSIS — G894 Chronic pain syndrome: Secondary | ICD-10-CM | POA: Insufficient documentation

## 2023-04-28 DIAGNOSIS — M2550 Pain in unspecified joint: Secondary | ICD-10-CM | POA: Diagnosis not present

## 2023-04-28 DIAGNOSIS — M7918 Myalgia, other site: Secondary | ICD-10-CM | POA: Insufficient documentation

## 2023-04-28 DIAGNOSIS — Z79891 Long term (current) use of opiate analgesic: Secondary | ICD-10-CM | POA: Insufficient documentation

## 2023-04-28 DIAGNOSIS — M170 Bilateral primary osteoarthritis of knee: Secondary | ICD-10-CM | POA: Diagnosis not present

## 2023-04-28 DIAGNOSIS — Z5181 Encounter for therapeutic drug level monitoring: Secondary | ICD-10-CM | POA: Insufficient documentation

## 2023-04-28 DIAGNOSIS — M542 Cervicalgia: Secondary | ICD-10-CM | POA: Diagnosis not present

## 2023-04-28 MED ORDER — OXYCODONE-ACETAMINOPHEN 7.5-325 MG PO TABS: 1.0000 | ORAL_TABLET | Freq: Three times a day (TID) | ORAL | 0 refills | Status: DC | PRN

## 2023-04-28 NOTE — Progress Notes (Signed)
Subjective:    Patient ID: Crystal [name], female    DOB: 12/12/1950, 72 y.o.   MRN: [id number]  HPI: Crystal [name] is a 72 y.o. female who returns for follow up appointment for chronic pain and medication refill. She states her pain is located in her neck and bilateral knee pain. She rates her pain 5. Her current exercise regime is walking and performing stretching exercises.  Ms. Steuart Morphine equivalent is 33.75 MME.   Last Oral Swab was Performed on 03/29/2023, it was consistent.    Pain Inventory Average Pain 8 Pain Right Now 5 My pain is sharp, burning, stabbing, and aching  In the last 24 hours, has pain interfered with the following? General activity 7 Relation with others 6 Enjoyment of life 8 What TIME of day is your pain at its worst? morning , evening, and night Sleep (in general) Good  Pain is worse with: walking, bending, standing, and some activites Pain improves with: rest and medication Relief from Meds: 8  Family History  Problem Relation Age of Onset   Hypertension Father    Heart disease Father    Cancer Father        prostate   Heart disease Other        (Maternal side) Ischemic heart disease   Diabetes Mellitus II Other    Arrhythmia Mother    Diabetes Mellitus II Mother        Borderline DM   Hypertension Mother    Asthma Mother    Cancer Brother    Dementia Brother    Hypertension Brother    Hypertension Daughter    Hypertension Daughter    Diabetes Daughter    Hypertension Daughter    Atrial fibrillation Daughter    GER disease Daughter    Hypertension Son    Anxiety disorder Son    Hypothyroidism Son    Breast cancer Maternal Grandmother        in her 21's   Social History   Socioeconomic History   Marital status: Widowed    Spouse name: Not on file   Number of children: Not on file   Years of education: Not on file   Highest education level: Not on file  Occupational History    Comment: retired LPN  Tobacco Use   Smoking  status: Never   Smokeless tobacco: Never  Vaping Use   Vaping status: Never Used  Substance and Sexual Activity   Alcohol use: Not Currently   Drug use: Never   Sexual activity: Not Currently  Other Topics Concern   Not on file  Social History Narrative   [place]  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      As of 03/15/17:   Diet: 1800 Calorie      Caffeine: Yes      Married, if yes what year: Widowed, married 1973      Do you live in a house, apartment, assisted living, condo, trailer, ect: House, 1 stories, and 1 person      Pets: No      Current/Past profession: LPN, retired       Exercise: Yes, walking          Living Will: Yes   DNR: No   POA/HPOA: Yes      Functional Status:   Do you have difficulty bathing or dressing yourself? No   Do you have  difficulty preparing food or eating? No   Do you have difficulty managing your medications? No   Do you have difficulty managing your finances? No   Do you have difficulty affording your medications? Yes   Social Determinants of Health   Financial Resource Strain: Low Risk  (10/11/2017)   Overall Financial Resource Strain (CARDIA)    Difficulty of Paying Living Expenses: Not hard at all  Food Insecurity: No Food Insecurity (01/03/2022)   Hunger Vital Sign    Worried About Running Out of Food in the Last Year: Never true    Ran Out of Food in the Last Year: Never true  Transportation Needs: No Transportation Needs (01/03/2022)   PRAPARE - Administrator, Civil Service (Medical): No    Lack of Transportation (Non-Medical): No  Physical Activity: Inactive (10/11/2017)   Exercise Vital Sign    Days of Exercise per Week: 0 days    Minutes of Exercise per Session: 0 min  Stress: Stress Concern Present (10/11/2017)   [name] - Occupational Stress Questionnaire    Feeling of Stress : To some extent  Social  Connections: Somewhat Isolated (10/11/2017)   Social Connection and Isolation Panel [NHANES]    Frequency of Communication with Friends and Family: More than three times a week    Frequency of Social Gatherings with Friends and Family: More than three times a week    Attends Religious Services: More than 4 times per year    Active Member of [employer] or Organizations: No    Attends Banker Meetings: Never    Marital Status: Widowed   Past Surgical History:  Procedure Laterality Date   APPENDECTOMY  07/13/2017   BLADDER SUSPENSION  1990   "w/hysterectomy"   BOWEL RESECTION N/A 07/09/2018   Procedure: SMALL BOWEL RESECTION;  Surgeon: Jimmye Norman, MD;  [facility];  Service: General;  Laterality: N/A;   CARDIAC CATHETERIZATION  2005   no obstructive CAD per patient   CARDIAC CATHETERIZATION  2018   CARDIAC DEFIBRILLATOR PLACEMENT  2005   BiV ICD implanted,  LV lead is an epicardial lead   CARPAL TUNNEL RELEASE Left 07/25/2014   Dr.Williamson    CARPAL TUNNEL RELEASE Right 04/08/2019   Procedure: RIGHT CARPAL TUNNEL RELEASE ENDOSCOPIC;  Surgeon: Mack Hook, MD;  Location: [facility] OR;  Service: Orthopedics;  Laterality: Right;   CATARACT EXTRACTION W/ INTRAOCULAR LENS  IMPLANT, BILATERAL Bilateral    COLONIC STENT PLACEMENT N/A 08/31/2017   Procedure: COLONIC STENT PLACEMENT;  Surgeon: Jeani Hawking, MD;  Location: [facility] ENDOSCOPY;  Service: Endoscopy;  Laterality: N/A;   COLONOSCOPY     2010-2011 Dr.Kipreos    COLOSTOMY  12/2016   Hattie Perch 01/26/2017   COLOSTOMY REVERSAL N/A 07/13/2017   Procedure: COLOSTOMY REVERSAL;  Surgeon: Jimmye Norman, MD;  [facility];  Service: General;  Laterality: N/A;   CYST REMOVAL HAND Right 05/2013   thumb   DILATION AND CURETTAGE OF UTERUS  X 5-6   ESOPHAGOGASTRODUODENOSCOPY (EGD) WITH PROPOFOL N/A 03/13/2020   Procedure: ESOPHAGOGASTRODUODENOSCOPY (EGD) WITH PROPOFOL;  Surgeon: Jeani Hawking, MD;  [facility] ENDOSCOPY;  Service: Endoscopy;   Laterality: N/A;   EXCISION MASS ABDOMINAL N/A 07/09/2018   Procedure: EXPLORATION OF  ABDOMINAL WOUND ERAS PATHWAY;  Surgeon: Jimmye Norman, MD;  Location: [facility] OR;  Service: General;  Laterality: N/A;   FLEXIBLE SIGMOIDOSCOPY N/A 08/24/2017   Procedure: Arnell Sieving;  Surgeon: Jeani Hawking, MD;  Location: [facility] ENDOSCOPY;  Service: Endoscopy;  Laterality: N/A;   FLEXIBLE SIGMOIDOSCOPY N/A 08/31/2017   Procedure: FLEXIBLE SIGMOIDOSCOPY;  Surgeon: Jeani Hawking, MD;  Location: [facility] ENDOSCOPY;  Service: Endoscopy;  Laterality: N/A;  stent placement   FLEXIBLE SIGMOIDOSCOPY N/A 03/13/2020   Procedure: FLEXIBLE SIGMOIDOSCOPY;  Surgeon: Jeani Hawking, MD;  [facility] ENDOSCOPY;  Service: Endoscopy;  Laterality: N/A;   FLEXIBLE SIGMOIDOSCOPY N/A 12/18/2020   Procedure: FLEXIBLE SIGMOIDOSCOPY;  Surgeon: Jeani Hawking, MD;  [facility] ENDOSCOPY;  Service: Endoscopy;  Laterality: N/A;   HEMOSTASIS CLIP PLACEMENT  03/13/2020   Procedure: HEMOSTASIS CLIP PLACEMENT;  Surgeon: Jeani Hawking, MD;  [facility] ENDOSCOPY;  Service: Endoscopy;;   HEMOSTASIS CLIP PLACEMENT  12/18/2020   Procedure: HEMOSTASIS CLIP PLACEMENT;  Surgeon: Jeani Hawking, MD;  [facility] ENDOSCOPY;  Service: Endoscopy;;   HOT HEMOSTASIS N/A 03/13/2020   Procedure: HOT HEMOSTASIS (ARGON PLASMA COAGULATION/BICAP);  Surgeon: Jeani Hawking, MD;  Location: [facility] ENDOSCOPY;  Service: Endoscopy;  Laterality: N/A;   HOT HEMOSTASIS N/A 12/18/2020   Procedure: HOT HEMOSTASIS (ARGON PLASMA COAGULATION/BICAP);  Surgeon: Jeani Hawking, MD;  Location: [facility] ENDOSCOPY;  Service: Endoscopy;  Laterality: N/A;   IMPLANTABLE CARDIOVERTER DEFIBRILLATOR GENERATOR CHANGE  2008   INSERTION OF MESH N/A 07/09/2018   Procedure: INSERTION OF VICRYL MESH;  Surgeon: Jimmye Norman, MD;  [facility];  Service: General;  Laterality: N/A;   LAPAROTOMY N/A 09/18/2017   Procedure: EXPLORATORY LAPAROTOMY;  Surgeon: Jimmye Norman, MD;  Location: [facility] OR;  Service: General;   Laterality: N/A;   LEAD REVISION  06/22/2018   LEAD REVISION/REPAIR N/A 06/22/2018   Procedure: LEAD REVISION/REPAIR;  Surgeon: Duke Salvia, MD;  Location: [facility] INVASIVE CV LAB;  Service: Cardiovascular;  Laterality: N/A;   LYSIS OF ADHESION N/A 09/18/2017   Procedure: LYSIS OF ADHESION;  Surgeon: Jimmye Norman, MD;  Location: [facility] OR;  Service: General;  Laterality: N/A;   LYSIS OF ADHESION N/A 07/09/2018   Procedure: LYSIS OF ADHESION;  Surgeon: Jimmye Norman, MD;  Location: [facility] OR;  Service: General;  Laterality: N/A;   PARTIAL COLECTOMY N/A 09/18/2017   Procedure: ILEOCOLECTOMY;  Surgeon: Jimmye Norman, MD;  Location: [facility] OR;  Service: General;  Laterality: N/A;   PARTIAL COLECTOMY  09/25/2017   POLYPECTOMY  03/13/2020   Procedure: POLYPECTOMY;  Surgeon: Jeani Hawking, MD;  [facility] ENDOSCOPY;  Service: Endoscopy;;   RIGHT/LEFT HEART CATH AND CORONARY ANGIOGRAPHY N/A 06/01/2018   Procedure: RIGHT/LEFT HEART CATH AND CORONARY ANGIOGRAPHY;  Surgeon: Corky Crafts, MD;  Location: [facility] INVASIVE CV LAB;  Service: Cardiovascular;  Laterality: N/A;   SIGMOIDOSCOPY N/A 09/18/2017   Procedure: SIGMOIDOSCOPY;  Surgeon: Jimmye Norman, MD;  [facility] OR;  Service: General;  Laterality: N/A;   SMALL INTESTINE SURGERY  07/09/2018   EXPLORATION OF  ABDOMINAL WOUND ERAS PATHWAYN/; MESH; LYSIS OF ADHESIONS   TOTAL ABDOMINAL HYSTERECTOMY  1990   TRANSFORAMINAL LUMBAR INTERBODY FUSION (TLIF) WITH PEDICLE SCREW FIXATION 2 LEVEL N/A 01/07/2020   Procedure: Lumbar Four-Five and Lumbar Five-Sacral One open lumbar decompression and transforaminal lumbar interbody fusion;  Surgeon: Jadene Pierini, MD;  [facility] OR;  Service: Neurosurgery;  Laterality: N/A;  Lumbar Four-Five and Lumbar Five-Sacral One open lumbar decompression and transforaminal lumbar interbody fusion   TRIGGER FINGER RELEASE Right 05/213   TRIGGER FINGER RELEASE Right 04/08/2019   Procedure: RIGHT INDEX RELEASE TRIGGER FINGER/A-1  PULLEY;  Surgeon: Mack Hook, MD;  Location: [facility] OR;  Service: Orthopedics;  Laterality: Right;   TUBAL LIGATION  1980s   VESICOVAGINAL FISTULA CLOSURE W/  TAH     Past Surgical History:  Procedure Laterality Date   APPENDECTOMY  07/13/2017   BLADDER SUSPENSION  1990   "w/hysterectomy"   BOWEL RESECTION N/A 07/09/2018   Procedure: SMALL BOWEL RESECTION;  Surgeon: Jimmye Norman, MD;  [facility];  Service: General;  Laterality: N/A;   CARDIAC CATHETERIZATION  2005   no obstructive CAD per patient   CARDIAC CATHETERIZATION  2018   CARDIAC DEFIBRILLATOR PLACEMENT  2005   BiV ICD implanted,  LV lead is an epicardial lead   CARPAL TUNNEL RELEASE Left 07/25/2014   Dr.Williamson    CARPAL TUNNEL RELEASE Right 04/08/2019   Procedure: RIGHT CARPAL TUNNEL RELEASE ENDOSCOPIC;  Surgeon: Mack Hook, MD;  Location: [facility] OR;  Service: Orthopedics;  Laterality: Right;   CATARACT EXTRACTION W/ INTRAOCULAR LENS  IMPLANT, BILATERAL Bilateral    COLONIC STENT PLACEMENT N/A 08/31/2017   Procedure: COLONIC STENT PLACEMENT;  Surgeon: Jeani Hawking, MD;  Location: [facility] ENDOSCOPY;  Service: Endoscopy;  Laterality: N/A;   COLONOSCOPY     2010-2011 Dr.Kipreos    COLOSTOMY  12/2016   Hattie Perch 01/26/2017   COLOSTOMY REVERSAL N/A 07/13/2017   Procedure: COLOSTOMY REVERSAL;  Surgeon: Jimmye Norman, MD;  [facility];  Service: General;  Laterality: N/A;   CYST REMOVAL HAND Right 05/2013   thumb   DILATION AND CURETTAGE OF UTERUS  X 5-6   ESOPHAGOGASTRODUODENOSCOPY (EGD) WITH PROPOFOL N/A 03/13/2020   Procedure: ESOPHAGOGASTRODUODENOSCOPY (EGD) WITH PROPOFOL;  Surgeon: Jeani Hawking, MD;  [facility] ENDOSCOPY;  Service: Endoscopy;  Laterality: N/A;   EXCISION MASS ABDOMINAL N/A 07/09/2018   Procedure: EXPLORATION OF  ABDOMINAL WOUND ERAS PATHWAY;  Surgeon: Jimmye Norman, MD;  Location: [facility] OR;  Service: General;  Laterality: N/A;   FLEXIBLE SIGMOIDOSCOPY N/A 08/24/2017   Procedure: Arnell Sieving;   Surgeon: Jeani Hawking, MD;  Location: [facility] ENDOSCOPY;  Service: Endoscopy;  Laterality: N/A;   FLEXIBLE SIGMOIDOSCOPY N/A 08/31/2017   Procedure: FLEXIBLE SIGMOIDOSCOPY;  Surgeon: Jeani Hawking, MD;  Location: [facility] ENDOSCOPY;  Service: Endoscopy;  Laterality: N/A;  stent placement   FLEXIBLE SIGMOIDOSCOPY N/A 03/13/2020   Procedure: FLEXIBLE SIGMOIDOSCOPY;  Surgeon: Jeani Hawking, MD;  Location[facility] ENDOSCOPY;  Service: Endoscopy;  Laterality: N/A;   FLEXIBLE SIGMOIDOSCOPY N/A 12/18/2020   Procedure: FLEXIBLE SIGMOIDOSCOPY;  Surgeon: Jeani Hawking, MD;  [facility] ENDOSCOPY;  Service: Endoscopy;  Laterality: N/A;   HEMOSTASIS CLIP PLACEMENT  03/13/2020   Procedure: HEMOSTASIS CLIP PLACEMENT;  Surgeon: Jeani Hawking, MD;  Location[facility] ENDOSCOPY;  Service: Endoscopy;;   HEMOSTASIS CLIP PLACEMENT  12/18/2020   Procedure: HEMOSTASIS CLIP PLACEMENT;  Surgeon: Jeani Hawking, MD;  [facility] ENDOSCOPY;  Service: Endoscopy;;   HOT HEMOSTASIS N/A 03/13/2020   Procedure: HOT HEMOSTASIS (ARGON PLASMA COAGULATION/BICAP);  Surgeon: Jeani Hawking, MD;  Location: [facility] ENDOSCOPY;  Service: Endoscopy;  Laterality: N/A;   HOT HEMOSTASIS N/A 12/18/2020   Procedure: HOT HEMOSTASIS (ARGON PLASMA COAGULATION/BICAP);  Surgeon: Jeani Hawking, MD;  Location: [facility] ENDOSCOPY;  Service: Endoscopy;  Laterality: N/A;   IMPLANTABLE CARDIOVERTER DEFIBRILLATOR GENERATOR CHANGE  2008   INSERTION OF MESH N/A 07/09/2018   Procedure: INSERTION OF VICRYL MESH;  Surgeon: Jimmye Norman, MD;  [facility] OR;  Service: General;  Laterality: N/A;   LAPAROTOMY N/A 09/18/2017   Procedure: EXPLORATORY LAPAROTOMY;  Surgeon: Jimmye Norman, MD;  Location: [facility] OR;  Service: General;  Laterality: N/A;   LEAD REVISION  06/22/2018   LEAD REVISION/REPAIR N/A 06/22/2018   Procedure: LEAD REVISION/REPAIR;  Surgeon: Duke Salvia,  MD;  [facility] INVASIVE CV LAB;  Service: Cardiovascular;  Laterality: N/A;   LYSIS OF ADHESION N/A 09/18/2017   Procedure: LYSIS  OF ADHESION;  Surgeon: Jimmye Norman, MD;  Location: [facility] OR;  Service: General;  Laterality: N/A;   LYSIS OF ADHESION N/A 07/09/2018   Procedure: LYSIS OF ADHESION;  Surgeon: Jimmye Norman, MD;  Location: [facility] OR;  Service: General;  Laterality: N/A;   PARTIAL COLECTOMY N/A 09/18/2017   Procedure: ILEOCOLECTOMY;  Surgeon: Jimmye Norman, MD;  Location: [facility] OR;  Service: General;  Laterality: N/A;   PARTIAL COLECTOMY  09/25/2017   POLYPECTOMY  03/13/2020   Procedure: POLYPECTOMY;  Surgeon: Jeani Hawking, MD;  Location[facility] ENDOSCOPY;  Service: Endoscopy;;   RIGHT/LEFT HEART CATH AND CORONARY ANGIOGRAPHY N/A 06/01/2018   Procedure: RIGHT/LEFT HEART CATH AND CORONARY ANGIOGRAPHY;  Surgeon: Corky Crafts, MD;  Location: [facility] INVASIVE CV LAB;  Service: Cardiovascular;  Laterality: N/A;   SIGMOIDOSCOPY N/A 09/18/2017   Procedure: SIGMOIDOSCOPY;  Surgeon: Jimmye Norman, MD;  [facility] OR;  Service: General;  Laterality: N/A;   SMALL INTESTINE SURGERY  07/09/2018   EXPLORATION OF  ABDOMINAL WOUND ERAS PATHWAYN/; MESH; LYSIS OF ADHESIONS   TOTAL ABDOMINAL HYSTERECTOMY  1990   TRANSFORAMINAL LUMBAR INTERBODY FUSION (TLIF) WITH PEDICLE SCREW FIXATION 2 LEVEL N/A 01/07/2020   Procedure: Lumbar Four-Five and Lumbar Five-Sacral One open lumbar decompression and transforaminal lumbar interbody fusion;  Surgeon: Jadene Pierini, MD;  Location: [facility] OR;  Service: Neurosurgery;  Laterality: N/A;  Lumbar Four-Five and Lumbar Five-Sacral One open lumbar decompression and transforaminal lumbar interbody fusion   TRIGGER FINGER RELEASE Right 05/213   TRIGGER FINGER RELEASE Right 04/08/2019   Procedure: RIGHT INDEX RELEASE TRIGGER FINGER/A-1 PULLEY;  Surgeon: Mack Hook, MD;  Location: [facility] OR;  Service: Orthopedics;  Laterality: Right;   TUBAL LIGATION  1980s   VESICOVAGINAL FISTULA CLOSURE W/ TAH     Past Medical History:  Diagnosis Date   AICD (automatic cardioverter/defibrillator) present    high RV threshold  chronically, device was turned off in 2014; Device battery has been dead x 7 years; "turned it back on 06/22/2018"   Anemia, iron deficiency    Angioedema    felt to likely be due to ace inhibitors but says she has had this even off of medicines, appears to be tolerating ARBs chronically   Anxiety    Arthritis    "hands, legs, arms; bad in my back" (06/22/2018)   Asthmatic bronchitis with status asthmaticus    Bradycardia    CHF (congestive heart failure) (HCC)    Chronic lower back pain    Chronic pain    CKD (chronic kidney disease), stage III (HCC)    Coronary artery disease    Mild nonobstructive (30% LAD, 30% RCA) by 09/01/16 cath at [facility]   Degenerative joint disease    Depression    Diabetes mellitus, type 2 (HCC)    Diabetic peripheral neuropathy (HCC)    Dizziness    Dyspnea on exertion    Family history of adverse reaction to anesthesia    Mother has nausea   GERD (gastroesophageal reflux disease)    Gout    Heart valve disorder    History of blood transfusion 1981; ~ 2005; 12/2016   "childbirth; defibrillator OR; colostomy OR"   History of mononucleosis 03/2014   Hyperlipidemia    Hypertension    Hypothyroid    Insomnia    Intervertebral disc degeneration    Ischemic cardiomyopathy  LBBB (left bundle branch block)    Myocardial infarction [facility]) dx'd ~ 2005   Nonischemic cardiomyopathy (HCC)    s/p ICD in 2005. EF has since recovered   Obesity    On home oxygen therapy    "2L at night" (06/22/2018)   OSA on CPAP    Pneumonia    "couple times; last time was 12/2016" (06/22/2018)   PONV (postoperative nausea and vomiting)    Presence of permanent cardiac pacemaker    [employer]   Swelling    Syncope    Systemic hypertension    Vitamin D deficiency    There were no vitals taken for this visit.  Opioid Risk Score:   Fall Risk Score:  `1  Depression screen [facility] 2/9     03/29/2023   11:15 AM 02/23/2023   10:54 AM 02/23/2023   10:47 AM  01/23/2023   11:30 AM 10/26/2022   11:03 AM 09/27/2022   11:03 AM 08/02/2022   11:22 AM  Depression screen PHQ 2/9  Decreased Interest 0 0 0 0 0 0 0  Down, Depressed, Hopeless 0 0 0 0 0 0 0  PHQ - 2 Score 0 0 0 0 0 0 0      Review of Systems  Musculoskeletal:  Positive for back pain and neck pain.       B/L wrist pain   All other systems reviewed and are negative.     Objective:   Physical Exam Vitals and nursing note reviewed.  Constitutional:      Appearance: Normal appearance.  Neck:     Comments: Cervical Paraspinal Tenderness: C-5-C-6 Cardiovascular:     Rate and Rhythm: Normal rate and regular rhythm.     Pulses: Normal pulses.     Heart sounds: Normal heart sounds.  Pulmonary:     Effort: Pulmonary effort is normal.     Breath sounds: Normal breath sounds.  Musculoskeletal:     Cervical back: Normal range of motion and neck supple.     Comments: Normal Muscle Bulk and Muscle Testing Reveals:  Upper Extremities:Full  ROM and Muscle Strength 5/5 Lumbar Paraspinal Tenderness: L-4-L-5 Lower Extremities: Full ROM and Muscle Strength 5/5 Arises from Table slowly using walker for support Narrow Based  Gait     Skin:    General: Skin is warm and dry.  Neurological:     Mental Status: She is alert and oriented to person, place, and time.  Psychiatric:        Mood and Affect: Mood normal.        Behavior: Behavior normal.         Assessment & Plan:  Chronic Low Back Pain: Encouraged to increase HEP as Tolerated. Continue to Monitor. 04/28/2023 2. Bilateral Primary Osteoarthritis:  S/P Right Knee Genicular nerve radiofrequency neurotomy, with Dr Wynn Banker. On 08/02/2022 and S/P Left Knee Genicular Nerve Radiofrequency on 08/16/2022. With good relief noted.  : Indication Chronic post operative pain in the Knee, pain postop total knee replacement which has not responded to conservative management such as physical therapy and medication management; Continue monitor.  04/28/2023 3. Myofascial Pain: S/P Trigger Point Injection on 06/29/2021 with Good Relief Noted. 04/28/2023 4. Chronic Pain Syndrome: Refilled: Oxycodone 7.5mg  /325 mg one tablet three times a day as needed for pain #90,second script sent to pharmacy to accommodate scheduled appointment. Discontinue Hydrocodone due to itching, Ms. Name verbalizes understanding.  We will continue the opioid monitoring program, this consists of regular clinic visits, examinations,  urine drug screen, pill counts as well as use of [place] Controlled Substance Reporting system. A 12 month History has been reviewed on the [place] Controlled Substance Reporting System on 04/28/2023. 5. Polyarthralgia: Continue HEP as tolerated. Continue current medication regimen. Continue to monitor. 04/28/2023   F/U in 1 month

## 2023-05-01 NOTE — Progress Notes (Signed)
[facility] PREP Weekly Session  Patient Details  Name: Crystal [name] MRN: [id number] Date of Birth: 02/11/1951 Age: 72 y.o. PCP: Rodrigo Ran, MD  Vitals:   05/01/23 1030  Weight: 253 lb (114.8 kg)     [facility] Weekly seesion - 05/01/23 1300       [facility] "PREP" Location   [facility] "PREP" Location [facility]      Weekly Session   Topic Discussed --   portions   Minutes exercised this week 250 minutes    Classes attended to date 12             Pam Jerral Bonito 05/01/2023, 1:19 PM

## 2023-05-04 DIAGNOSIS — I129 Hypertensive chronic kidney disease with stage 1 through stage 4 chronic kidney disease, or unspecified chronic kidney disease: Secondary | ICD-10-CM | POA: Diagnosis not present

## 2023-05-04 DIAGNOSIS — I5042 Chronic combined systolic (congestive) and diastolic (congestive) heart failure: Secondary | ICD-10-CM | POA: Diagnosis not present

## 2023-05-04 DIAGNOSIS — E1122 Type 2 diabetes mellitus with diabetic chronic kidney disease: Secondary | ICD-10-CM | POA: Diagnosis not present

## 2023-05-04 DIAGNOSIS — N2581 Secondary hyperparathyroidism of renal origin: Secondary | ICD-10-CM | POA: Diagnosis not present

## 2023-05-04 DIAGNOSIS — D509 Iron deficiency anemia, unspecified: Secondary | ICD-10-CM | POA: Diagnosis not present

## 2023-05-04 DIAGNOSIS — N184 Chronic kidney disease, stage 4 (severe): Secondary | ICD-10-CM | POA: Diagnosis not present

## 2023-05-08 NOTE — Progress Notes (Signed)
[facility] PREP Weekly Session  Patient Details  Name: Crystal [name] MRN: [id number] Date of Birth: 1950-10-29 Age: 72 y.o. PCP: Rodrigo Ran, MD  Vitals:   05/08/23 1030  Weight: 253 lb (114.8 kg)     [facility] Weekly seesion - 05/08/23 1400       [facility] "PREP" Location   [facility]P" [facility]      Weekly Session   Topic Discussed Finding support    Minutes exercised this week 130 minutes    Classes attended to date 14             Pam Jerral Bonito 05/08/2023, 2:31 PM

## 2023-05-16 NOTE — Progress Notes (Signed)
[facility] PREP Weekly Session  Patient Details  Name: [name] MRN: [id number] Date of Birth: [birth year] Age: 72 y.o. PCP: Rodrigo Ran, MD  Vitals:   05/15/23 1030  Weight: 248 lb (112.5 kg)     [facility] Weekly seesion - 05/16/23 1200       [facility] "PREP" Location   [facility]      Weekly Session   Topic Discussed Calorie breakdown    Minutes exercised this week 160 minutes    Classes attended to date 15             Bonnye Fava 05/16/2023, 12:14 PM

## 2023-05-25 ENCOUNTER — Ambulatory Visit (HOSPITAL_BASED_OUTPATIENT_CLINIC_OR_DEPARTMENT_OTHER): Payer: Medicare Other | Admitting: Pulmonary Disease

## 2023-05-25 DIAGNOSIS — R0609 Other forms of dyspnea: Secondary | ICD-10-CM | POA: Diagnosis not present

## 2023-05-25 LAB — PULMONARY FUNCTION TEST
DL/VA % pred: 107 %
DL/VA: 4.43 ml/min/mmHg/L
DLCO cor % pred: 80 %
DLCO cor: 15.5 ml/min/mmHg
DLCO unc % pred: 80 %
DLCO unc: 15.5 ml/min/mmHg
FEF 25-75 Post: 1.11 L/s
FEF 25-75 Pre: 0.97 L/s
FEF2575-%Change-Post: 14 %
FEF2575-%Pred-Post: 63 %
FEF2575-%Pred-Pre: 55 %
FEV1-%Change-Post: 3 %
FEV1-%Pred-Post: 64 %
FEV1-%Pred-Pre: 62 %
FEV1-Post: 1.4 L
FEV1-Pre: 1.35 L
FEV1FVC-%Change-Post: 2 %
FEV1FVC-%Pred-Pre: 97 %
FEV6-%Change-Post: 0 %
FEV6-%Pred-Post: 67 %
FEV6-%Pred-Pre: 66 %
FEV6-Post: 1.86 L
FEV6-Pre: 1.84 L
FEV6FVC-%Pred-Post: 104 %
FEV6FVC-%Pred-Pre: 104 %
FVC-%Change-Post: 0 %
FVC-%Pred-Post: 64 %
FVC-%Pred-Pre: 63 %
FVC-Post: 1.86 L
FVC-Pre: 1.84 L
Post FEV1/FVC ratio: 76 %
Post FEV6/FVC ratio: 100 %
Pre FEV1/FVC ratio: 73 %
Pre FEV6/FVC Ratio: 100 %
RV % pred: 106 %
RV: 2.4 L
TLC % pred: 81 %
TLC: 4.1 L

## 2023-05-25 NOTE — Progress Notes (Signed)
Full PFT Performed Today  

## 2023-05-25 NOTE — Patient Instructions (Signed)
Full PFT Performed Today  

## 2023-05-29 ENCOUNTER — Ambulatory Visit (INDEPENDENT_AMBULATORY_CARE_PROVIDER_SITE_OTHER): Payer: Medicare Other | Admitting: Pulmonary Disease

## 2023-05-29 ENCOUNTER — Ambulatory Visit: Payer: Self-pay

## 2023-05-29 ENCOUNTER — Encounter: Payer: Self-pay | Admitting: Pulmonary Disease

## 2023-05-29 VITALS — BP 127/69 | HR 77 | Ht 64.0 in | Wt 251.0 lb

## 2023-05-29 DIAGNOSIS — J4540 Moderate persistent asthma, uncomplicated: Secondary | ICD-10-CM

## 2023-05-29 DIAGNOSIS — R0609 Other forms of dyspnea: Secondary | ICD-10-CM

## 2023-05-29 MED ORDER — BREZTRI AEROSPHERE 160-9-4.8 MCG/ACT IN AERO: 2.0000 | INHALATION_SPRAY | Freq: Two times a day (BID) | RESPIRATORY_TRACT | Status: AC

## 2023-05-29 NOTE — Progress Notes (Signed)
@Patient  ID: Crystal [name], female    DOB: 15-Oct-1950, 72 y.o.   MRN: [id number]  Chief Complaint  Patient presents with   Follow-up    Pt will not cover breo and wants to know if she can have an rx for breztri     Referring provider: Rodrigo Ran, MD  HPI:   72 y.o. woman here for evaluation of dyspnea exertion.  Multiple PM&R are notes reviewed.  Returns for routine follow-up.  Some concern for possible asthma at last visit.  Prescribed Breo.  This is too expensive.  She started using some of her mother's Breztri.  She feels like it helps some.  In the interim PFTs have been performed.  These were reviewed in detail with patient.  This revealed spirometry just of moderate restriction versus air trapping, no bronchodilator response, lung volumes consistent with air trapping, no restriction.  DLCO within normal limits.  Subtle signs of asthma.  HPI at initial visit: History of nonischemic cardiomyopathy.  EF is gradually improved to normal after evidence-based medicine, cardiac treatment.  In general is short of breath.  With exertion.  Worse on inclines or stairs.  No time of day with any better or worse.  No position make it better or worse.  No seasonal or environmental factors she got to the hide on exam is better or worse.  Feels like this has been on Lasix things are better.  Seems like sometimes when awake and lower extremity swelling fluctuates breathing worsens in the morning gets better and improves.  No relieving or exacerbating factors.  Diagnosed with asthma in the past.  Shortness of this come back.  Not on any inhalers.  She is on nocturnal oxygen.  Most recent chest x-ray 10/2021 reveals on my review and interpretation enlarged cardiac silhouette, otherwise clear lungs.  No lateral view so evaluation is limited.  Questionaires / Pulmonary Flowsheets:   ACT:      No data to display          MMRC:     No data to display          [facility]:      No data to  display          Tests:   FENO:  No results found for: "NITRICOXIDE"  PFT:    Latest Ref Rng & Units 05/25/2023    8:59 AM  PFT Results  FVC-Pre L 1.84  P  FVC-Predicted Pre % 63  P  FVC-Post L 1.86  P  FVC-Predicted Post % 64  P  Pre FEV1/FVC % % 73  P  Post FEV1/FCV % % 76  P  FEV1-Pre L 1.35  P  FEV1-Predicted Pre % 62  P  FEV1-Post L 1.40  P  DLCO uncorrected ml/min/mmHg 15.50  P  DLCO UNC% % 80  P  DLCO corrected ml/min/mmHg 15.50  P  DLCO COR %Predicted % 80  P  DLVA Predicted % 107  P  TLC L 4.10  P  TLC % Predicted % 81  P  RV % Predicted % 106  P    P Preliminary result  Personally reviewed interpret as spirometry suggestive of moderate restriction versus air trapping, no bronchodilator response.  No restriction on lung volumes, TLC within normal limits, elevated RV/TLC ratio consistent with air trapping.  DLCO within normal limits.  WALK:      No data to display          Imaging: Personally reviewed  and as per EMR discussion this note No results found.  Lab Results: Personally reviewed CBC    Component Value Date/Time   WBC 11.1 (H) 10/28/2021 0809   RBC 4.43 10/28/2021 0809   HGB 10.4 (L) 10/28/2021 0809   HGB 11.9 09/24/2018 1052   HCT 34.5 (L) 10/28/2021 0809   HCT 37.0 09/24/2018 1052   PLT 274 10/28/2021 0809   PLT 208 09/24/2018 1052   MCV 77.9 (L) 10/28/2021 0809   MCV 84 09/24/2018 1052   MCH 23.5 (L) 10/28/2021 0809   MCHC 30.1 10/28/2021 0809   RDW 17.9 (H) 10/28/2021 0809   RDW 15.8 (H) 09/24/2018 1052   LYMPHSABS 2.2 10/27/2021 0905   LYMPHSABS 2.0 06/11/2018 1116   MONOABS 1.1 (H) 10/27/2021 0905   EOSABS 0.3 10/27/2021 0905   EOSABS 0.2 06/11/2018 1116   BASOSABS 0.1 10/27/2021 0905   BASOSABS 0.1 06/11/2018 1116    BMET    Component Value Date/Time   NA 139 11/25/2022 1439   K 4.2 11/25/2022 1439   CL 99 11/25/2022 1439   CO2 25 11/25/2022 1439   GLUCOSE 130 (H) 11/25/2022 1439   GLUCOSE 78 10/30/2021 0404    BUN 53 (H) 11/25/2022 1439   CREATININE 2.52 (H) 11/25/2022 1439   CREATININE 3.10 (H) 08/17/2017 1545   CALCIUM 9.0 11/25/2022 1439   GFRNONAA 35 (L) 10/30/2021 0404   GFRNONAA 15 (L) 08/17/2017 1545   GFRAA 32 (L) 01/08/2020 0618   GFRAA 17 (L) 08/17/2017 1545    BNP    Component Value Date/Time   BNP 50.2 08/07/2019 1732    ProBNP    Component Value Date/Time   PROBNP 948 (H) 05/23/2018 1440    Specialty Problems       Pulmonary Problems   Airway hyperreactivity   Acute respiratory failure with hypoxia (HCC)   Acute hypoxemic respiratory failure due to COVID-19 (HCC)    Allergies  Allergen Reactions   Ace Inhibitors Swelling and Other (See Comments)    Angioedema Other reaction(s): swelling Other reaction(s): swelling Other reaction(s): swelling Other reaction(s): Unknown   Glucophage [Metformin Hcl] Other (See Comments)    Renal failure   Telmisartan Other (See Comments)    AVOID ARB/ ACEi in this patient due to recurrent AKI   Advicor [Niacin-Lovastatin Er] Other (See Comments)    Muscle aches   Atorvastatin Other (See Comments)    Muscle aches   Bystolic [Nebivolol Hcl] Swelling    Whole body swelled   Erythromycin Diarrhea, Nausea And Vomiting and Other (See Comments)    *DERIVATIVES*  Other reaction(s): severe vomiting  Other reaction(s): Unknown   Lopid [Gemfibrozil] Other (See Comments)    Muscle aches   Statins Other (See Comments)    Muscle aches tolerates crestor Other reaction(s): Unknown Other reaction(s): muscle aches   Erythromycin Base Nausea And Vomiting   Hydromorphone Other (See Comments)    Confusion Other reaction(s): confusion.can take lower level narcs but those cause a lot of itching Other reaction(s): confusion.can take lower level narcs but those cause a lot of itching   Lisinopril    Losartan     Other reaction(s): stopped 12/2018. ? due to worse gfr Other reaction(s): stopped 12/2018. ? due to worse gfr Other  reaction(s): stopped 12/2018. ? due to worse gfr Other reaction(s): stopped 12/2018. ? due to worse gfr   Nebivolol Hcl Swelling    Other reaction(s): swelling  Other reaction(s): Unknown  Other Reaction(s): Not available   Other  Other reaction(s): muscle aches   Sacubitril-Valsartan     Other reaction(s): itching Other reaction(s): itching Other reaction(s): itching Other reaction(s): itching   Spironolactone     Other reaction(s): arf Other reaction(s): arf Other reaction(s): arf Other reaction(s): arf    Immunization History  Administered Date(s) Administered   Influenza, High Dose Seasonal PF 05/04/2018   Influenza,inj,Quad PF,6+ Mos 04/26/2017   Influenza-Unspecified 05/11/2016   PFIZER(Purple Top)SARS-COV-2 Vaccination 11/16/2019   PPD Test 06/10/2015   Pneumococcal Conjugate-13 11/17/2011   Pneumococcal Polysaccharide-23 05/07/2004, 07/14/2017   Td 09/19/2001   Zoster, Live 05/24/2012    Past Medical History:  Diagnosis Date   AICD (automatic cardioverter/defibrillator) present    high RV threshold chronically, device was turned off in 2014; Device battery has been dead x 7 years; "turned it back on 06/22/2018"   Anemia, iron deficiency    Angioedema    felt to likely be due to ace inhibitors but says she has had this even off of medicines, appears to be tolerating ARBs chronically   Anxiety    Arthritis    "hands, legs, arms; bad in my back" (06/22/2018)   Asthmatic bronchitis with status asthmaticus    Bradycardia    CHF (congestive heart failure) (HCC)    Chronic lower back pain    Chronic pain    CKD (chronic kidney disease), stage III (HCC)    Coronary artery disease    Mild nonobstructive (30% LAD, 30% RCA) by 09/01/16 cath at [facility]   Degenerative joint disease    Depression    Diabetes mellitus, type 2 (HCC)    Diabetic peripheral neuropathy (HCC)    Dizziness    Dyspnea on exertion    Family history of adverse reaction to  anesthesia    Mother has nausea   GERD (gastroesophageal reflux disease)    Gout    Heart valve disorder    History of blood transfusion 1981; ~ 2005; 12/2016   "childbirth; defibrillator OR; colostomy OR"   History of mononucleosis 03/2014   Hyperlipidemia    Hypertension    Hypothyroid    Insomnia    Intervertebral disc degeneration    Ischemic cardiomyopathy    LBBB (left bundle branch block)    Myocardial infarction (HCC) dx'd ~ 2005   Nonischemic cardiomyopathy (HCC)    s/p ICD in 2005. EF has since recovered   Obesity    On home oxygen therapy    "2L at night" (06/22/2018)   OSA on CPAP    Pneumonia    "couple times; last time was 12/2016" (06/22/2018)   PONV (postoperative nausea and vomiting)    Presence of permanent cardiac pacemaker    [employer]   Swelling    Syncope    Systemic hypertension    Vitamin D deficiency     Tobacco History: Social History   Tobacco Use  Smoking Status Never  Smokeless Tobacco Never   Counseling given: Not Answered   Continue to not smoke  Outpatient Encounter Medications as of 05/29/2023  Medication Sig   Accu-Chek FastClix Lancets MISC Apply topically 3 (three) times daily.   allopurinol (ZYLOPRIM) 100 MG tablet Take by mouth.   carvedilol (COREG) 25 MG tablet Take 25 mg by mouth 2 (two) times daily.    Cholecalciferol 50 MCG (2000 UT) TABS 1 capsule Orally Once a day   dapagliflozin propanediol (FARXIGA) 10 MG TABS tablet Take 10 mg by mouth daily.   DULoxetine (CYMBALTA) 60 MG capsule Take 60  mg by mouth daily.   febuxostat (ULORIC) 40 MG tablet Take 40 mg by mouth at bedtime.   fluticasone furoate-vilanterol (BREO ELLIPTA) 200-25 MCG/ACT AEPB Inhale 1 puff into the lungs daily.   hydrALAZINE (APRESOLINE) 25 MG tablet Take 1 tablet (25 mg total) by mouth in the morning and at bedtime.   Insulin Regular Human (HUMULIN R U-500 KWIKPEN [place]) Inject 130 Units into the skin 3 (three) times daily. Pt takes 130 units in the  morning, and 145 units in the afternoon and night.   levothyroxine (SYNTHROID, LEVOTHROID) 88 MCG tablet Take 88 mcg by mouth daily before breakfast.    loratadine (CLARITIN) 10 MG tablet Take 10 mg by mouth daily.   montelukast (SINGULAIR) 10 MG tablet Take by mouth.   Omega-3 Fatty Acids (OMEGA 3 PO) Take 1 mg by mouth in the morning and at bedtime.   oxyCODONE-acetaminophen (PERCOCET) 7.5-325 MG tablet Take 1 tablet by mouth every 8 (eight) hours as needed.   pantoprazole (PROTONIX) 40 MG tablet Take 40 mg by mouth 2 (two) times daily.    PRESCRIPTION MEDICATION Inhale into the lungs at bedtime. CPAP with  2 liter oxygen   rosuvastatin (CRESTOR) 5 MG tablet 2 tablet Orally Once a day   temazepam (RESTORIL) 15 MG capsule Take by mouth.   torsemide (DEMADEX) 20 MG tablet Take 20 mg by mouth as needed.   No facility-administered encounter medications on file as of 05/29/2023.     Review of Systems  Review of Systems  N/a Physical Exam  BP 127/69   Pulse 77   Ht 5\' 4"  (1.626 m)   Wt 251 lb (113.9 kg)   SpO2 94% Comment: ra  BMI 43.08 kg/m   Wt Readings from Last 5 Encounters:  05/29/23 251 lb (113.9 kg)  05/25/23 248 lb (112.5 kg)  05/15/23 248 lb (112.5 kg)  05/08/23 253 lb (114.8 kg)  05/01/23 253 lb (114.8 kg)    BMI Readings from Last 5 Encounters:  05/29/23 43.08 kg/m  05/25/23 42.57 kg/m  05/15/23 42.57 kg/m  05/08/23 43.43 kg/m  05/01/23 43.43 kg/m     Physical Exam General: Sitting in chair, no acute distress Eyes: EOMI, no icterus Neck: Supple, no JVD. Pulmonary: Clear, normal work of breathing MSK: No synovitis, no joint effusion Neuro: Awake and alert, no focal deficits Psych normal mood, full affect   Assessment & Plan:   Dyspnea on exertion: Suspect multifactorial related to deconditioning, habitus.  Possible reemergence of asthma given history of the same.  Improves somewhat with diuretic therapy arguing for component of pulmonary venous  hypertension related structural heart disease.  Notably LVEDP, wedge pressure was elevated on prior catheterizations.  Group 2 pulmonary hypertension noted on right heart cath 05/2018, likely contributing.   Chest x-ray is clear.  PFTs with air trapping otherwise within normal limits.  Consistent with likely asthma.  Asthma: Diagnosed years ago.  Has not had active prescription inhaler in years.  With worsening dyspnea concern for possible asthma.  Breo too expensive but did not meet deductible.  Currently using Breztri with mild improvement.  Makes sense given air trapping.  Continue Breztri.   Return in about 6 months (around 11/26/2023).   Karren Burly, MD 05/29/2023

## 2023-05-29 NOTE — Patient Instructions (Signed)
Nice to see you again  Okay to use Breztri 2 puffs in the morning and 2 puffs in the evening, every day.  Her mouth out thoroughly with water after every use.  I will add it to your medication list.  When you need refills please send [place] a message.  Your pulmonary function test look consistent with asthma.  Extra air in your lungs when you breathe out that  can make you short of breath.  [facility] has 2 medicines to help treat this.  Return to clinic in 6 months or sooner as needed with Dr. Judeth Horn

## 2023-05-29 NOTE — Patient Outreach (Signed)
Care Coordination   Follow Up Visit Note   05/29/2023 Name: Crystal [name] MRN: [id number] DOB: [birth year]  Crystal [name] is a 72 y.o. year old female who sees Rodrigo Ran, MD for primary care. I spoke with  Crystal [name] by phone today.  What matters to the patients health and wellness today?  Patient would like to f/u with her Pulmonologist to discuss her PFT.     Goals Addressed             This Visit's Progress    To better manage swelling and shortness of breath   On track    Care Coordination Interventions: Evaluation of current treatment plan related to nonischemic cardiomyopathy and patient's adherence to plan as established by provider Advised patient to weigh each morning after emptying bladder Discussed importance of daily weight and advised patient to weigh and record daily Reviewed role of diuretics in prevention of fluid overload and management of heart failure; Discussed the importance of keeping all appointments with provider        To improve shortness of breath related to Asthma   On track    Care Coordination Interventions: Evaluation of current treatment plan related to hx of Asthma, dyspnea on exertion and patient's adherence to plan as established by provider Review of patient status, including review of consultant's reports, relevant laboratory and other test results, and medications completed Counseled patient on importance of keeping all scheduled appointments, patient will call to schedule a Pulmonology appointment to discuss PFT results Encouraged patient to pace herself and use deep breathing exercises to help move trapped air and slow breathing, patient states her shortness of breath has improved since increasing her Lasix as directed Discussed plans with patient for ongoing care coordination follow up and provided patient with direct contact information for nurse care coordinator        To maintain or improve current kidney function   On track     Care Coordination Interventions: Assessed the Patient understanding of chronic kidney disease    Evaluation of current treatment plan related to chronic kidney disease self management and patient's adherence to plan as established by provider      Provided education on kidney disease progression    Review of patient status, including review of consultant's reports, relevant laboratory and other test results, and medications completed Last practice recorded BP readings:  BP Readings from Last 3 Encounters:  04/28/23 107/65  03/30/23 (!) 131/55  03/29/23 131/69   Most recent eGFR/CrCl:  Lab Results  Component Value Date   EGFR 27.0 01/24/2023    No components found for: "CRCL"     Interventions Today    Flowsheet Row Most Recent Value  Chronic Disease   Chronic disease during today's visit Diabetes, Congestive Heart Failure (CHF), Other  [Asthma,  dyspnea on exertion]  General Interventions   General Interventions Discussed/Reviewed General Interventions Discussed, General Interventions Reviewed, Doctor Visits, Vaccines, Durable Medical Equipment (DME)  Vaccines Flu  Doctor Visits Discussed/Reviewed Doctor Visits Discussed, Doctor Visits Reviewed, PCP, Specialist  Durable Medical Equipment (DME) Glucomoter  Exercise Interventions   Exercise Discussed/Reviewed Physical Activity, Exercise Reviewed, Exercise Discussed  Physical Activity Discussed/Reviewed Physical Activity Reviewed, Physical Activity Discussed, PREP  Education Interventions   Education Provided Provided Education  Provided Verbal Education On When to see the doctor, Exercise, Blood Sugar Monitoring, Medication  Pharmacy Interventions   Pharmacy Dicussed/Reviewed Pharmacy Topics Reviewed, Pharmacy Topics Discussed, Medications and their functions  SDOH assessments and interventions completed:  No     Care Coordination Interventions:  Yes, provided   Follow up plan: Follow up call scheduled for  07/06/23 @11 :30 AM    Encounter Outcome:  Patient Visit Completed

## 2023-05-29 NOTE — Patient Instructions (Signed)
Visit Information  Thank you for taking time to visit with me today. Please don't hesitate to contact me if I can be of assistance to you.   Following are the goals we discussed today:   Goals Addressed             This Visit's Progress    To better manage swelling and shortness of breath   On track    Care Coordination Interventions: Evaluation of current treatment plan related to nonischemic cardiomyopathy and patient's adherence to plan as established by provider Advised patient to weigh each morning after emptying bladder Discussed importance of daily weight and advised patient to weigh and record daily Reviewed role of diuretics in prevention of fluid overload and management of heart failure; Discussed the importance of keeping all appointments with provider        To improve shortness of breath related to Asthma   On track    Care Coordination Interventions: Evaluation of current treatment plan related to hx of Asthma, dyspnea on exertion and patient's adherence to plan as established by provider Review of patient status, including review of consultant's reports, relevant laboratory and other test results, and medications completed Counseled patient on importance of keeping all scheduled appointments, patient will call to schedule a Pulmonology appointment to discuss PFT results Encouraged patient to pace herself and use deep breathing exercises to help move trapped air and slow breathing, patient states her shortness of breath has improved since increasing her Lasix as directed Discussed plans with patient for ongoing care coordination follow up and provided patient with direct contact information for nurse care coordinator        To maintain or improve current kidney function   On track    Care Coordination Interventions: Assessed the Patient understanding of chronic kidney disease    Evaluation of current treatment plan related to chronic kidney disease self management  and patient's adherence to plan as established by provider      Provided education on kidney disease progression    Review of patient status, including review of consultant's reports, relevant laboratory and other test results, and medications completed Last practice recorded BP readings:  BP Readings from Last 3 Encounters:  04/28/23 107/65  03/30/23 (!) 131/55  03/29/23 131/69   Most recent eGFR/CrCl:  Lab Results  Component Value Date   EGFR 27.0 01/24/2023    No components found for: "CRCL"         Our next appointment is by telephone on 07/06/23 at 11:30 AM  Please call the care guide team at [phone] if you need to cancel or reschedule your appointment.   If you are experiencing a Mental Health or Behavioral Health Crisis or need someone to talk to, please call 1-800-273-TALK (toll free, 24 hour hotline)  Patient verbalizes understanding of instructions and care plan provided today and agrees to view in MyChart. Active MyChart status and patient understanding of how to access instructions and care plan via MyChart confirmed with patient.     Delsa Sale RN BSN CCM [place]  [facility], [facility] Nurse Care Coordinator  Direct Dial: [phone] Website: [email or web address]

## 2023-06-01 DIAGNOSIS — Z23 Encounter for immunization: Secondary | ICD-10-CM | POA: Diagnosis not present

## 2023-06-01 DIAGNOSIS — E785 Hyperlipidemia, unspecified: Secondary | ICD-10-CM | POA: Diagnosis not present

## 2023-06-01 DIAGNOSIS — N184 Chronic kidney disease, stage 4 (severe): Secondary | ICD-10-CM | POA: Diagnosis not present

## 2023-06-01 DIAGNOSIS — D649 Anemia, unspecified: Secondary | ICD-10-CM | POA: Diagnosis not present

## 2023-06-01 DIAGNOSIS — E1129 Type 2 diabetes mellitus with other diabetic kidney complication: Secondary | ICD-10-CM | POA: Diagnosis not present

## 2023-06-01 DIAGNOSIS — I130 Hypertensive heart and chronic kidney disease with heart failure and stage 1 through stage 4 chronic kidney disease, or unspecified chronic kidney disease: Secondary | ICD-10-CM | POA: Diagnosis not present

## 2023-06-01 DIAGNOSIS — Z794 Long term (current) use of insulin: Secondary | ICD-10-CM | POA: Diagnosis not present

## 2023-06-05 NOTE — Progress Notes (Signed)
[facility] PREP Evaluation  Patient Details  Name: Crystal [name] MRN: [id number] Date of Birth: 01/18/1951 Age: 72 y.o. PCP: Rodrigo Ran, MD  Vitals:   06/05/23 1100  BP: (!) 126/56  Pulse: 78  SpO2: 97%  Weight: 250 lb (113.4 kg)     [facility] Eval - 06/05/23 1500       [facility] "PREP" Location   [facility] "PREP" Location [facility]      Referral    Referring Provider Perini    Reason for referral Hypertension;Diabetes;Heart Failure;Inactivity;Obesitity/Overweight    Program Start Date 02/20/23    Program End Date 05/29/23      Measurement   Waist Circumference 57 inches    Waist Circumference End Program 51 inches    Hip Circumference 57 inches    Hip Circumference End Program 56 inches    Body fat --   not measured     Information for Trainer   Goals Plans on continuing to exercise      Mobility and Daily Activities   I find it easy to walk up or down two or more flights of stairs. 3    I have no trouble taking out the trash. 2    I do housework such as vacuuming and dusting on my own without difficulty. 2    I can easily lift a gallon of milk (8lbs). 4    I can easily walk a mile. 1    I have no trouble reaching into high cupboards or reaching down to pick up something from the floor. 3    I do not have trouble doing out-door work such as Loss adjuster, chartered, raking leaves, or gardening. 1      Mobility and Daily Activities   I feel younger than my age. 1    I feel independent. 4    I feel energetic. 2    I live an active life.  3    I feel strong. 2    I feel healthy. 2    I feel active as other people my age. 2      How fit and strong are you.   Fit and Strong Total Score 32            Past Medical History:  Diagnosis Date   AICD (automatic cardioverter/defibrillator) present    high RV threshold chronically, device was turned off in 2014; Device battery has been dead x 7 years; "turned it back on 06/22/2018"   Anemia, iron deficiency    Angioedema    felt  to likely be due to ace inhibitors but says she has had this even off of medicines, appears to be tolerating ARBs chronically   Anxiety    Arthritis    "hands, legs, arms; bad in my back" (06/22/2018)   Asthmatic bronchitis with status asthmaticus    Bradycardia    CHF (congestive heart failure) (HCC)    Chronic lower back pain    Chronic pain    CKD (chronic kidney disease), stage III (HCC)    Coronary artery disease    Mild nonobstructive (30% LAD, 30% RCA) by 09/01/16 cath at [facility]   Degenerative joint disease    Depression    Diabetes mellitus, type 2 (HCC)    Diabetic peripheral neuropathy (HCC)    Dizziness    Dyspnea on exertion    Family history of adverse reaction to anesthesia    Mother has nausea   GERD (gastroesophageal reflux disease)  Gout    Heart valve disorder    History of blood transfusion 1981; ~ 2005; 12/2016   "childbirth; defibrillator OR; colostomy OR"   History of mononucleosis 03/2014   Hyperlipidemia    Hypertension    Hypothyroid    Insomnia    Intervertebral disc degeneration    Ischemic cardiomyopathy    LBBB (left bundle branch block)    Myocardial infarction (HCC) dx'd ~ 2005   Nonischemic cardiomyopathy (HCC)    s/p ICD in 2005. EF has since recovered   Obesity    On home oxygen therapy    "2L at night" (06/22/2018)   OSA on CPAP    Pneumonia    "couple times; last time was 12/2016" (06/22/2018)   PONV (postoperative nausea and vomiting)    Presence of permanent cardiac pacemaker    [employer]   Swelling    Syncope    Systemic hypertension    Vitamin D deficiency    Past Surgical History:  Procedure Laterality Date   APPENDECTOMY  07/13/2017   BLADDER SUSPENSION  1990   "w/hysterectomy"   BOWEL RESECTION N/A 07/09/2018   Procedure: SMALL BOWEL RESECTION;  Surgeon: Jimmye Norman, MD;  Location: [facility] OR;  Service: General;  Laterality: N/A;   CARDIAC CATHETERIZATION  2005   no obstructive CAD per patient    CARDIAC CATHETERIZATION  2018   CARDIAC DEFIBRILLATOR PLACEMENT  2005   BiV ICD implanted,  LV lead is an epicardial lead   CARPAL TUNNEL RELEASE Left 07/25/2014   Dr.Williamson    CARPAL TUNNEL RELEASE Right 04/08/2019   Procedure: RIGHT CARPAL TUNNEL RELEASE ENDOSCOPIC;  Surgeon: Mack Hook, MD;  Location: [facility] OR;  Service: Orthopedics;  Laterality: Right;   CATARACT EXTRACTION W/ INTRAOCULAR LENS  IMPLANT, BILATERAL Bilateral    COLONIC STENT PLACEMENT N/A 08/31/2017   Procedure: COLONIC STENT PLACEMENT;  Surgeon: Jeani Hawking, MD;  Location: [facility] ENDOSCOPY;  Service: Endoscopy;  Laterality: N/A;   COLONOSCOPY     2010-2011 Dr.Kipreos    COLOSTOMY  12/2016   Hattie Perch 01/26/2017   COLOSTOMY REVERSAL N/A 07/13/2017   Procedure: COLOSTOMY REVERSAL;  Surgeon: Jimmye Norman, MD;  Location: [facility] OR;  Service: General;  Laterality: N/A;   CYST REMOVAL HAND Right 05/2013   thumb   DILATION AND CURETTAGE OF UTERUS  X 5-6   ESOPHAGOGASTRODUODENOSCOPY (EGD) WITH PROPOFOL N/A 03/13/2020   Procedure: ESOPHAGOGASTRODUODENOSCOPY (EGD) WITH PROPOFOL;  Surgeon: Jeani Hawking, MD;  Location: [facility] ENDOSCOPY;  Service: Endoscopy;  Laterality: N/A;   EXCISION MASS ABDOMINAL N/A 07/09/2018   Procedure: EXPLORATION OF  ABDOMINAL WOUND ERAS PATHWAY;  Surgeon: Jimmye Norman, MD;  Location: [facility] OR;  Service: General;  Laterality: N/A;   FLEXIBLE SIGMOIDOSCOPY N/A 08/24/2017   Procedure: Arnell Sieving;  Surgeon: Jeani Hawking, MD;  Location: [facility] ENDOSCOPY;  Service: Endoscopy;  Laterality: N/A;   FLEXIBLE SIGMOIDOSCOPY N/A 08/31/2017   Procedure: FLEXIBLE SIGMOIDOSCOPY;  Surgeon: Jeani Hawking, MD;  Location: [facility] ENDOSCOPY;  Service: Endoscopy;  Laterality: N/A;  stent placement   FLEXIBLE SIGMOIDOSCOPY N/A 03/13/2020   Procedure: FLEXIBLE SIGMOIDOSCOPY;  Surgeon: Jeani Hawking, MD;  Location: [facility] ENDOSCOPY;  Service: Endoscopy;  Laterality: N/A;   FLEXIBLE SIGMOIDOSCOPY N/A 12/18/2020   Procedure: FLEXIBLE  SIGMOIDOSCOPY;  Surgeon: Jeani Hawking, MD;  Location: [facility] ENDOSCOPY;  Service: Endoscopy;  Laterality: N/A;   HEMOSTASIS CLIP PLACEMENT  03/13/2020   Procedure: HEMOSTASIS CLIP PLACEMENT;  Surgeon: Jeani Hawking, MD;  Location: [facility] ENDOSCOPY;  Service: Endoscopy;;   HEMOSTASIS CLIP PLACEMENT  12/18/2020   Procedure: HEMOSTASIS CLIP PLACEMENT;  Surgeon: Jeani Hawking, MD;  Location: [facility] ENDOSCOPY;  Service: Endoscopy;;   HOT HEMOSTASIS N/A 03/13/2020   Procedure: HOT HEMOSTASIS (ARGON PLASMA COAGULATION/BICAP);  Surgeon: Jeani Hawking, MD;  Location: [facility] ENDOSCOPY;  Service: Endoscopy;  Laterality: N/A;   HOT HEMOSTASIS N/A 12/18/2020   Procedure: HOT HEMOSTASIS (ARGON PLASMA COAGULATION/BICAP);  Surgeon: Jeani Hawking, MD;  Location: [facility] ENDOSCOPY;  Service: Endoscopy;  Laterality: N/A;   IMPLANTABLE CARDIOVERTER DEFIBRILLATOR GENERATOR CHANGE  2008   INSERTION OF MESH N/A 07/09/2018   Procedure: INSERTION OF VICRYL MESH;  Surgeon: Jimmye Norman, MD;  Location: [facility] OR;  Service: General;  Laterality: N/A;   LAPAROTOMY N/A 09/18/2017   Procedure: EXPLORATORY LAPAROTOMY;  Surgeon: Jimmye Norman, MD;  Location: [facility] OR;  Service: General;  Laterality: N/A;   LEAD REVISION  06/22/2018   LEAD REVISION/REPAIR N/A 06/22/2018   Procedure: LEAD REVISION/REPAIR;  Surgeon: Duke Salvia, MD;  Location: [facility] INVASIVE CV LAB;  Service: Cardiovascular;  Laterality: N/A;   LYSIS OF ADHESION N/A 09/18/2017   Procedure: LYSIS OF ADHESION;  Surgeon: Jimmye Norman, MD;  Location: [facility] OR;  Service: General;  Laterality: N/A;   LYSIS OF ADHESION N/A 07/09/2018   Procedure: LYSIS OF ADHESION;  Surgeon: Jimmye Norman, MD;  Location: [facility] OR;  Service: General;  Laterality: N/A;   PARTIAL COLECTOMY N/A 09/18/2017   Procedure: ILEOCOLECTOMY;  Surgeon: Jimmye Norman, MD;  Location: [facility] OR;  Service: General;  Laterality: N/A;   PARTIAL COLECTOMY  09/25/2017   POLYPECTOMY  03/13/2020   Procedure: POLYPECTOMY;  Surgeon: Jeani Hawking, MD;   Location: [facility] ENDOSCOPY;  Service: Endoscopy;;   RIGHT/LEFT HEART CATH AND CORONARY ANGIOGRAPHY N/A 06/01/2018   Procedure: RIGHT/LEFT HEART CATH AND CORONARY ANGIOGRAPHY;  Surgeon: Corky Crafts, MD;  Location: [facility] INVASIVE CV LAB;  Service: Cardiovascular;  Laterality: N/A;   SIGMOIDOSCOPY N/A 09/18/2017   Procedure: SIGMOIDOSCOPY;  Surgeon: Jimmye Norman, MD;  Location: [facility] OR;  Service: General;  Laterality: N/A;   SMALL INTESTINE SURGERY  07/09/2018   EXPLORATION OF  ABDOMINAL WOUND ERAS PATHWAYN/; MESH; LYSIS OF ADHESIONS   TOTAL ABDOMINAL HYSTERECTOMY  1990   TRANSFORAMINAL LUMBAR INTERBODY FUSION (TLIF) WITH PEDICLE SCREW FIXATION 2 LEVEL N/A 01/07/2020   Procedure: Lumbar Four-Five and Lumbar Five-Sacral One open lumbar decompression and transforaminal lumbar interbody fusion;  Surgeon: Jadene Pierini, MD;  Location: [facility] OR;  Service: Neurosurgery;  Laterality: N/A;  Lumbar Four-Five and Lumbar Five-Sacral One open lumbar decompression and transforaminal lumbar interbody fusion   TRIGGER FINGER RELEASE Right 05/213   TRIGGER FINGER RELEASE Right 04/08/2019   Procedure: RIGHT INDEX RELEASE TRIGGER FINGER/A-1 PULLEY;  Surgeon: Mack Hook, MD;  Location: [facility] OR;  Service: Orthopedics;  Laterality: Right;   TUBAL LIGATION  1980s   VESICOVAGINAL FISTULA CLOSURE W/ TAH     Social History   Tobacco Use  Smoking Status Never  Smokeless Tobacco Never  Attended 17 sessions, 10 educational Fit testing: Cardio march: 200 (50% seated) to 160 100% standing Sit to stand: 11 to 13 Bicep curl: 17 to 21 Encouraged to continue to exercise and do balance exercises  Break down into exercise 'snacks' when pain is bad  Bonnye Fava 06/05/2023, 3:43 PM

## 2023-06-15 ENCOUNTER — Encounter: Payer: Self-pay | Admitting: Registered Nurse

## 2023-06-15 ENCOUNTER — Encounter: Payer: Medicare Other | Attending: Registered Nurse | Admitting: Registered Nurse

## 2023-06-15 VITALS — BP 122/69 | HR 76 | Ht 64.0 in | Wt 250.0 lb

## 2023-06-15 DIAGNOSIS — Z5181 Encounter for therapeutic drug level monitoring: Secondary | ICD-10-CM | POA: Diagnosis not present

## 2023-06-15 DIAGNOSIS — M170 Bilateral primary osteoarthritis of knee: Secondary | ICD-10-CM | POA: Diagnosis not present

## 2023-06-15 DIAGNOSIS — M2550 Pain in unspecified joint: Secondary | ICD-10-CM | POA: Insufficient documentation

## 2023-06-15 DIAGNOSIS — Z79891 Long term (current) use of opiate analgesic: Secondary | ICD-10-CM | POA: Insufficient documentation

## 2023-06-15 DIAGNOSIS — G8929 Other chronic pain: Secondary | ICD-10-CM | POA: Insufficient documentation

## 2023-06-15 DIAGNOSIS — G894 Chronic pain syndrome: Secondary | ICD-10-CM | POA: Insufficient documentation

## 2023-06-15 DIAGNOSIS — M545 Low back pain, unspecified: Secondary | ICD-10-CM | POA: Diagnosis not present

## 2023-06-15 DIAGNOSIS — M25531 Pain in right wrist: Secondary | ICD-10-CM | POA: Diagnosis not present

## 2023-06-15 NOTE — Progress Notes (Signed)
Subjective:    Patient ID: Crystal [name], female    DOB: 1950/10/14, 72 y.o.   MRN: [id number]  HPI: Crystal [name] is a 72 y.o. female who returns for follow up appointment for chronic pain and medication refill. She states her pain is located in her right wrist, she denies falling, she will F/U with her Orthopedist. She also reports lower back pain and bilateral knee pain. She rates her pain 7.Her  current exercise regime is walking and performing stretching exercises.  Crystal [name] Morphine equivalent is 33.75 MME.   Last Oral Swab was Performed on 03/29/2023, it was consistent.     Pain Inventory Average Pain 9 Pain Right Now 7 My pain is sharp, burning, stabbing, and aching  In the last 24 hours, has pain interfered with the following? General activity 8 Relation with others 7 Enjoyment of life 7 What TIME of day is your pain at its worst? morning , evening, and night Sleep (in general) Good  Pain is worse with: walking, bending, standing, and some activites Pain improves with: rest and medication Relief from Meds: 5  Family History  Problem Relation Age of Onset   Hypertension Father    Heart disease Father    Cancer Father        prostate   Heart disease Other        (Maternal side) Ischemic heart disease   Diabetes Mellitus II Other    Arrhythmia Mother    Diabetes Mellitus II Mother        Borderline DM   Hypertension Mother    Asthma Mother    Cancer Brother    Dementia Brother    Hypertension Brother    Hypertension Daughter    Hypertension Daughter    Diabetes Daughter    Hypertension Daughter    Atrial fibrillation Daughter    GER disease Daughter    Hypertension Son    Anxiety disorder Son    Hypothyroidism Son    Breast cancer Maternal Grandmother        in her 61's   Social History   Socioeconomic History   Marital status: Widowed    Spouse name: Not on file   Number of children: Not on file   Years of education: Not on file   Highest  education level: Not on file  Occupational History    Comment: retired LPN  Tobacco Use   Smoking status: Never   Smokeless tobacco: Never  Vaping Use   Vaping status: Never Used  Substance and Sexual Activity   Alcohol use: Not Currently   Drug use: Never   Sexual activity: Not Currently  Other Topics Concern   Not on file  Social History Narrative   [place].  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      As of 03/15/17:   Diet: 1800 Calorie      Caffeine: Yes      Married, if yes what year: Widowed, married 1973      Do you live in a house, apartment, assisted living, [place], trailer, ect: House, 1 stories, and 1 person      Pets: No      Current/Past profession: LPN, retired       Exercise: Yes, walking          Living Will: Yes   DNR: No   POA/HPOA: Yes      Functional  Status:   Do you have difficulty bathing or dressing yourself? No   Do you have difficulty preparing food or eating? No   Do you have difficulty managing your medications? No   Do you have difficulty managing your finances? No   Do you have difficulty affording your medications? Yes   Social Determinants of Health   Financial Resource Strain: Low Risk  (10/11/2017)   Overall Financial Resource Strain (CARDIA)    Difficulty of Paying Living Expenses: Not hard at all  Food Insecurity: No Food Insecurity (01/03/2022)   Hunger Vital Sign    Worried About Running Out of Food in the Last Year: Never true    Ran Out of Food in the Last Year: Never true  Transportation Needs: No Transportation Needs (01/03/2022)   PRAPARE - Administrator, Civil Service (Medical): No    Lack of Transportation (Non-Medical): No  Physical Activity: Inactive (10/11/2017)   Exercise Vital Sign    Days of Exercise per Week: 0 days    Minutes of Exercise per Session: 0 min  Stress: Stress Concern Present (10/11/2017)   [employer] of  Occupational Health - Occupational Stress Questionnaire    Feeling of Stress : To some extent  Social Connections: Somewhat Isolated (10/11/2017)   Social Connection and Isolation Panel [NHANES]    Frequency of Communication with Friends and Family: More than three times a week    Frequency of Social Gatherings with Friends and Family: More than three times a week    Attends Religious Services: More than 4 times per year    Active Member of [employer] or Organizations: No    Attends Banker Meetings: Never    Marital Status: Widowed   Past Surgical History:  Procedure Laterality Date   APPENDECTOMY  07/13/2017   BLADDER SUSPENSION  1990   "w/hysterectomy"   BOWEL RESECTION N/A 07/09/2018   Procedure: SMALL BOWEL RESECTION;  Surgeon: Jimmye Norman, MD;  [facility];  Service: General;  Laterality: N/A;   CARDIAC CATHETERIZATION  2005   no obstructive CAD per patient   CARDIAC CATHETERIZATION  2018   CARDIAC DEFIBRILLATOR PLACEMENT  2005   BiV ICD implanted,  LV lead is an epicardial lead   CARPAL TUNNEL RELEASE Left 07/25/2014   Dr.Williamson    CARPAL TUNNEL RELEASE Right 04/08/2019   Procedure: RIGHT CARPAL TUNNEL RELEASE ENDOSCOPIC;  Surgeon: Mack Hook, MD;  Location: [facility] OR;  Service: Orthopedics;  Laterality: Right;   CATARACT EXTRACTION W/ INTRAOCULAR LENS  IMPLANT, BILATERAL Bilateral    COLONIC STENT PLACEMENT N/A 08/31/2017   Procedure: COLONIC STENT PLACEMENT;  Surgeon: Jeani Hawking, MD;  Location: [facility] ENDOSCOPY;  Service: Endoscopy;  Laterality: N/A;   COLONOSCOPY     2010-2011 Dr.Kipreos    COLOSTOMY  12/2016   Hattie Perch 01/26/2017   COLOSTOMY REVERSAL N/A 07/13/2017   Procedure: COLOSTOMY REVERSAL;  Surgeon: Jimmye Norman, MD;  [facility];  Service: General;  Laterality: N/A;   CYST REMOVAL HAND Right 05/2013   thumb   DILATION AND CURETTAGE OF UTERUS  X 5-6   ESOPHAGOGASTRODUODENOSCOPY (EGD) WITH PROPOFOL N/A 03/13/2020   Procedure:  ESOPHAGOGASTRODUODENOSCOPY (EGD) WITH PROPOFOL;  Surgeon: Jeani Hawking, MD;  Location: [facility] ENDOSCOPY;  Service: Endoscopy;  Laterality: N/A;   EXCISION MASS ABDOMINAL N/A 07/09/2018   Procedure: EXPLORATION OF  ABDOMINAL WOUND ERAS PATHWAY;  Surgeon: Jimmye Norman, MD;  Location: [facility] OR;  Service: General;  Laterality: N/A;   FLEXIBLE SIGMOIDOSCOPY N/A 08/24/2017  Procedure: FLEXIBLE SIGMOIDOSCOPY;  Surgeon: Jeani Hawking, MD;  Location: [facility] ENDOSCOPY;  Service: Endoscopy;  Laterality: N/A;   FLEXIBLE SIGMOIDOSCOPY N/A 08/31/2017   Procedure: FLEXIBLE SIGMOIDOSCOPY;  Surgeon: Jeani Hawking, MD;  Location: [facility] ENDOSCOPY;  Service: Endoscopy;  Laterality: N/A;  stent placement   FLEXIBLE SIGMOIDOSCOPY N/A 03/13/2020   Procedure: FLEXIBLE SIGMOIDOSCOPY;  Surgeon: Jeani Hawking, MD;  [facility] ENDOSCOPY;  Service: Endoscopy;  Laterality: N/A;   FLEXIBLE SIGMOIDOSCOPY N/A 12/18/2020   Procedure: FLEXIBLE SIGMOIDOSCOPY;  Surgeon: Jeani Hawking, MD;  [facility] ENDOSCOPY;  Service: Endoscopy;  Laterality: N/A;   HEMOSTASIS CLIP PLACEMENT  03/13/2020   Procedure: HEMOSTASIS CLIP PLACEMENT;  Surgeon: Jeani Hawking, MD;  [facility] ENDOSCOPY;  Service: Endoscopy;;   HEMOSTASIS CLIP PLACEMENT  12/18/2020   Procedure: HEMOSTASIS CLIP PLACEMENT;  Surgeon: Jeani Hawking, MD;  [facility] ENDOSCOPY;  Service: Endoscopy;;   HOT HEMOSTASIS N/A 03/13/2020   Procedure: HOT HEMOSTASIS (ARGON PLASMA COAGULATION/BICAP);  Surgeon: Jeani Hawking, MD;  Location: [facility] ENDOSCOPY;  Service: Endoscopy;  Laterality: N/A;   HOT HEMOSTASIS N/A 12/18/2020   Procedure: HOT HEMOSTASIS (ARGON PLASMA COAGULATION/BICAP);  Surgeon: Jeani Hawking, MD;  Location: [facility] ENDOSCOPY;  Service: Endoscopy;  Laterality: N/A;   IMPLANTABLE CARDIOVERTER DEFIBRILLATOR GENERATOR CHANGE  2008   INSERTION OF MESH N/A 07/09/2018   Procedure: INSERTION OF VICRYL MESH;  Surgeon: Jimmye Norman, MD;  [facility];  Service: General;  Laterality: N/A;    LAPAROTOMY N/A 09/18/2017   Procedure: EXPLORATORY LAPAROTOMY;  Surgeon: Jimmye Norman, MD;  Location: [facility] OR;  Service: General;  Laterality: N/A;   LEAD REVISION  06/22/2018   LEAD REVISION/REPAIR N/A 06/22/2018   Procedure: LEAD REVISION/REPAIR;  Surgeon: Duke Salvia, MD;  Location: [facility] INVASIVE CV LAB;  Service: Cardiovascular;  Laterality: N/A;   LYSIS OF ADHESION N/A 09/18/2017   Procedure: LYSIS OF ADHESION;  Surgeon: Jimmye Norman, MD;  Location: [facility] OR;  Service: General;  Laterality: N/A;   LYSIS OF ADHESION N/A 07/09/2018   Procedure: LYSIS OF ADHESION;  Surgeon: Jimmye Norman, MD;  Location: [facility] OR;  Service: General;  Laterality: N/A;   PARTIAL COLECTOMY N/A 09/18/2017   Procedure: ILEOCOLECTOMY;  Surgeon: Jimmye Norman, MD;  Location: [facility] OR;  Service: General;  Laterality: N/A;   PARTIAL COLECTOMY  09/25/2017   POLYPECTOMY  03/13/2020   Procedure: POLYPECTOMY;  Surgeon: Jeani Hawking, MD;  [facility] ENDOSCOPY;  Service: Endoscopy;;   RIGHT/LEFT HEART CATH AND CORONARY ANGIOGRAPHY N/A 06/01/2018   Procedure: RIGHT/LEFT HEART CATH AND CORONARY ANGIOGRAPHY;  Surgeon: Corky Crafts, MD;  Location: [facility] INVASIVE CV LAB;  Service: Cardiovascular;  Laterality: N/A;   SIGMOIDOSCOPY N/A 09/18/2017   Procedure: SIGMOIDOSCOPY;  Surgeon: Jimmye Norman, MD;  [facility] OR;  Service: General;  Laterality: N/A;   SMALL INTESTINE SURGERY  07/09/2018   EXPLORATION OF  ABDOMINAL WOUND ERAS PATHWAYN/; MESH; LYSIS OF ADHESIONS   TOTAL ABDOMINAL HYSTERECTOMY  1990   TRANSFORAMINAL LUMBAR INTERBODY FUSION (TLIF) WITH PEDICLE SCREW FIXATION 2 LEVEL N/A 01/07/2020   Procedure: Lumbar Four-Five and Lumbar Five-Sacral One open lumbar decompression and transforaminal lumbar interbody fusion;  Surgeon: Jadene Pierini, MD;  Location: [facility] OR;  Service: Neurosurgery;  Laterality: N/A;  Lumbar Four-Five and Lumbar Five-Sacral One open lumbar decompression and transforaminal lumbar interbody fusion   TRIGGER  FINGER RELEASE Right 05/213   TRIGGER FINGER RELEASE Right 04/08/2019   Procedure: RIGHT INDEX RELEASE TRIGGER FINGER/A-1 PULLEY;  Surgeon: Mack Hook, MD;  Location: [facility] OR;  Service:  Orthopedics;  Laterality: Right;   TUBAL LIGATION  1980s   VESICOVAGINAL FISTULA CLOSURE W/ TAH     Past Surgical History:  Procedure Laterality Date   APPENDECTOMY  07/13/2017   BLADDER SUSPENSION  1990   "w/hysterectomy"   BOWEL RESECTION N/A 07/09/2018   Procedure: SMALL BOWEL RESECTION;  Surgeon: Jimmye Norman, MD;  [facility];  Service: General;  Laterality: N/A;   CARDIAC CATHETERIZATION  2005   no obstructive CAD per patient   CARDIAC CATHETERIZATION  2018   CARDIAC DEFIBRILLATOR PLACEMENT  2005   BiV ICD implanted,  LV lead is an epicardial lead   CARPAL TUNNEL RELEASE Left 07/25/2014   Dr.Williamson    CARPAL TUNNEL RELEASE Right 04/08/2019   Procedure: RIGHT CARPAL TUNNEL RELEASE ENDOSCOPIC;  Surgeon: Mack Hook, MD;  Location: [facility] OR;  Service: Orthopedics;  Laterality: Right;   CATARACT EXTRACTION W/ INTRAOCULAR LENS  IMPLANT, BILATERAL Bilateral    COLONIC STENT PLACEMENT N/A 08/31/2017   Procedure: COLONIC STENT PLACEMENT;  Surgeon: Jeani Hawking, MD;  Location: [facility] ENDOSCOPY;  Service: Endoscopy;  Laterality: N/A;   COLONOSCOPY     2010-2011 Dr.Kipreos    COLOSTOMY  12/2016   Hattie Perch 01/26/2017   COLOSTOMY REVERSAL N/A 07/13/2017   Procedure: COLOSTOMY REVERSAL;  Surgeon: Jimmye Norman, MD;  [facility] OR;  Service: General;  Laterality: N/A;   CYST REMOVAL HAND Right 05/2013   thumb   DILATION AND CURETTAGE OF UTERUS  X 5-6   ESOPHAGOGASTRODUODENOSCOPY (EGD) WITH PROPOFOL N/A 03/13/2020   Procedure: ESOPHAGOGASTRODUODENOSCOPY (EGD) WITH PROPOFOL;  Surgeon: Jeani Hawking, MD;  [facility] ENDOS[facility];  Service: Endoscopy;  Laterality: N/A;   EXCISION MASS ABDOMINAL N/A 07/09/2018   Procedure: EXPLORATION OF  ABDOMINAL WOUND ERAS PATHWAY;  Surgeon: Jimmye Norman, MD;  Location: [facility]  OR;  Service: General;  Laterality: N/A;   FLEXIBLE SIGMOIDOSCOPY N/A 08/24/2017   Procedure: Arnell Sieving;  Surgeon: Jeani Hawking, MD;  Location: [facility] ENDOSCOPY;  Service: Endoscopy;  Laterality: N/A;   FLEXIBLE SIGMOIDOSCOPY N/A 08/31/2017   Procedure: FLEXIBLE SIGMOIDOSCOPY;  Surgeon: Jeani Hawking, MD;  Location: [facility] ENDOSCOPY;  Service: Endoscopy;  Laterality: N/A;  stent placement   FLEXIBLE SIGMOIDOSCOPY N/A 03/13/2020   Procedure: FLEXIBLE SIGMOIDOSCOPY;  Surgeon: Jeani Hawking, MD;  Location[facility] ENDOSCOPY;  Service: Endoscopy;  Laterality: N/A;   FLEXIBLE SIGMOIDOSCOPY N/A 12/18/2020   Procedure: FLEXIBLE SIGMOIDOSCOPY;  Surgeon: Jeani Hawking, MD;  Location: [facility] ENDOSCOPY;  Service: Endoscopy;  Laterality: N/A;   HEMOSTASIS CLIP PLACEMENT  03/13/2020   Procedure: HEMOSTASIS CLIP PLACEMENT;  Surgeon: Jeani Hawking, MD;  Location[facility] ENDOSCOPY;  Service: Endoscopy;;   HEMOSTASIS CLIP PLACEMENT  12/18/2020   Procedure: HEMOSTASIS CLIP PLACEMENT;  Surgeon: Jeani Hawking, MD;  Location[facility] ENDOSCOPY;  Service: Endoscopy;;   HOT HEMOSTASIS N/A 03/13/2020   Procedure: HOT HEMOSTASIS (ARGON PLASMA COAGULATION/BICAP);  Surgeon: Jeani Hawking, MD;  Location: [facility] ENDOSCOPY;  Service: Endoscopy;  Laterality: N/A;   HOT HEMOSTASIS N/A 12/18/2020   Procedure: HOT HEMOSTASIS (ARGON PLASMA COAGULATION/BICAP);  Surgeon: Jeani Hawking, MD;  Location: [facility] ENDOSCOPY;  Service: Endoscopy;  Laterality: N/A;   IMPLANTABLE CARDIOVERTER DEFIBRILLATOR GENERATOR CHANGE  2008   INSERTION OF MESH N/A 07/09/2018   Procedure: INSERTION OF VICRYL MESH;  Surgeon: Jimmye Norman, MD;  Location: [facility] OR;  Service: General;  Laterality: N/A;   LAPAROTOMY N/A 09/18/2017   Procedure: EXPLORATORY LAPAROTOMY;  Surgeon: Jimmye Norman, MD;  Location: [facility] OR;  Service: General;  Laterality: N/A;   LEAD REVISION  06/22/2018  LEAD REVISION/REPAIR N/A 06/22/2018   Procedure: LEAD REVISION/REPAIR;  Surgeon: Duke Salvia, MD;  Location:  [facility] INVASIVE CV LAB;  Service: Cardiovascular;  Laterality: N/A;   LYSIS OF ADHESION N/A 09/18/2017   Procedure: LYSIS OF ADHESION;  Surgeon: Jimmye Norman, MD;  Location: [facility] OR;  Service: General;  Laterality: N/A;   LYSIS OF ADHESION N/A 07/09/2018   Procedure: LYSIS OF ADHESION;  Surgeon: Jimmye Norman, MD;  Location: [facility] OR;  Service: General;  Laterality: N/A;   PARTIAL COLECTOMY N/A 09/18/2017   Procedure: ILEOCOLECTOMY;  Surgeon: Jimmye Norman, MD;  Location: [facility] OR;  Service: General;  Laterality: N/A;   PARTIAL COLECTOMY  09/25/2017   POLYPECTOMY  03/13/2020   Procedure: POLYPECTOMY;  Surgeon: Jeani Hawking, MD;  Location[facility] ENDOSCOPY;  Service: Endoscopy;;   RIGHT/LEFT HEART CATH AND CORONARY ANGIOGRAPHY N/A 06/01/2018   Procedure: RIGHT/LEFT HEART CATH AND CORONARY ANGIOGRAPHY;  Surgeon: Corky Crafts, MD;  Location: [facility] INVASIVE CV LAB;  Service: Cardiovascular;  Laterality: N/A;   SIGMOIDOSCOPY N/A 09/18/2017   Procedure: SIGMOIDOSCOPY;  Surgeon: Jimmye Norman, MD;  Location[facility] OR;  Service: General;  Laterality: N/A;   SMALL INTESTINE SURGERY  07/09/2018   EXPLORATION OF  ABDOMINAL WOUND ERAS PATHWAYN/; MESH; LYSIS OF ADHESIONS   TOTAL ABDOMINAL HYSTERECTOMY  1990   TRANSFORAMINAL LUMBAR INTERBODY FUSION (TLIF) WITH PEDICLE SCREW FIXATION 2 LEVEL N/A 01/07/2020   Procedure: Lumbar Four-Five and Lumbar Five-Sacral One open lumbar decompression and transforaminal lumbar interbody fusion;  Surgeon: Jadene Pierini, MD;  Location: [facility] OR;  Service: Neurosurgery;  Laterality: N/A;  Lumbar Four-Five and Lumbar Five-Sacral One open lumbar decompression and transforaminal lumbar interbody fusion   TRIGGER FINGER RELEASE Right 05/213   TRIGGER FINGER RELEASE Right 04/08/2019   Procedure: RIGHT INDEX RELEASE TRIGGER FINGER/A-1 PULLEY;  Surgeon: Mack Hook, MD;  Location: [facility] OR;  Service: Orthopedics;  Laterality: Right;   TUBAL LIGATION  1980s   VESICOVAGINAL FISTULA CLOSURE W/ TAH      Past Medical History:  Diagnosis Date   AICD (automatic cardioverter/defibrillator) present    high RV threshold chronically, device was turned off in 2014; Device battery has been dead x 7 years; "turned it back on 06/22/2018"   Anemia, iron deficiency    Angioedema    felt to likely be due to ace inhibitors but says she has had this even off of medicines, appears to be tolerating ARBs chronically   Anxiety    Arthritis    "hands, legs, arms; bad in my back" (06/22/2018)   Asthmatic bronchitis with status asthmaticus    Bradycardia    CHF (congestive heart failure) (HCC)    Chronic lower back pain    Chronic pain    CKD (chronic kidney disease), stage III (HCC)    Coronary artery disease    Mild nonobstructive (30% LAD, 30% RCA) by 09/01/16 cath at [facility]   Degenerative joint disease    Depression    Diabetes mellitus, type 2 (HCC)    Diabetic peripheral neuropathy (HCC)    Dizziness    Dyspnea on exertion    Family history of adverse reaction to anesthesia    Mother has nausea   GERD (gastroesophageal reflux disease)    Gout    Heart valve disorder    History of blood transfusion 1981; ~ 2005; 12/2016   "childbirth; defibrillator OR; colostomy OR"   History of mononucleosis 03/2014   Hyperlipidemia    Hypertension    Hypothyroid  Insomnia    Intervertebral disc degeneration    Ischemic cardiomyopathy    LBBB (left bundle branch block)    Myocardial infarction (HCC) dx'd ~ 2005   Nonischemic cardiomyopathy (HCC)    s/p ICD in 2005. EF has since recovered   Obesity    On home oxygen therapy    "2L at night" (06/22/2018)   OSA on CPAP    Pneumonia    "couple times; last time was 12/2016" (06/22/2018)   PONV (postoperative nausea and vomiting)    Presence of permanent cardiac pacemaker    [employer]   Swelling    Syncope    Systemic hypertension    Vitamin D deficiency    Ht 5\' 4"  (1.626 m)   BMI 42.91 kg/m   Opioid Risk Score:   Fall  Risk Score:  `1  Depression screen [facility] 2/9     03/29/2023   11:15 AM 02/23/2023   10:54 AM 02/23/2023   10:47 AM 01/23/2023   11:30 AM 10/26/2022   11:03 AM 09/27/2022   11:03 AM 08/02/2022   11:22 AM  Depression screen PHQ 2/9  Decreased Interest 0 0 0 0 0 0 0  Down, Depressed, Hopeless 0 0 0 0 0 0 0  PHQ - 2 Score 0 0 0 0 0 0 0     Review of Systems  Musculoskeletal:  Positive for back pain and gait problem.       B/L knee and elbow pain Right wrist pain   All other systems reviewed and are negative.     Objective:   Physical Exam Vitals and nursing note reviewed.  Constitutional:      Appearance: Normal appearance. She is obese.  Cardiovascular:     Rate and Rhythm: Normal rate and regular rhythm.     Pulses: Normal pulses.     Heart sounds: Normal heart sounds.  Pulmonary:     Effort: Pulmonary effort is normal.     Breath sounds: Normal breath sounds.  Musculoskeletal:     Comments: Normal Muscle Bulk and Muscle Testing Reveals:  Upper Extremities: Full ROM and Muscle Strength on Right 4/5 and Left 5/5 Wearing Right Wrist  splint  Lower Extremities: Full ROM and Muscle Strength 5/5 Right Lower Extremity Flexion Produces Pain into her Right Patella Arises from Table slowly using walker for support Antalgic Gait     Skin:    General: Skin is warm and dry.  Neurological:     Mental Status: She is alert and oriented to person, place, and time.  Psychiatric:        Mood and Affect: Mood normal.        Behavior: Behavior normal.         Assessment & Plan:  Chronic Low Back Pain: Encouraged to increase HEP as Tolerated. Continue to Monitor. 06/15/2023 2. Bilateral Primary Osteoarthritis:  S/P Right Knee Genicular nerve radiofrequency neurotomy, with Dr Wynn Banker. On 08/02/2022 and S/P Left Knee Genicular Nerve Radiofrequency on 08/16/2022. With good relief noted.  : Indication Chronic post operative pain in the Knee, pain postop total knee replacement which has not  responded to conservative management such as physical therapy and medication management; Continue monitor. 06/15/2023 3. Myofascial Pain: S/P Trigger Point Injection on 06/29/2021 with Good Relief Noted. 06/15/2023 4. Chronic Pain Syndrome: Refilled: Oxycodone 7.5mg  /325 mg one tablet three times a day as needed for pain #90,second script sent to pharmacy to accommodate scheduled appointment. Discontinue Hydrocodone due to itching, Ms. Kizzie Bane  verbalizes understanding.  We will continue the opioid monitoring program, this consists of regular clinic visits, examinations, urine drug screen, pill counts as well as use of [place] Controlled Substance Reporting system. A 12 month History has been reviewed on the [place] Controlled Substance Reporting System on 06/15/2023. 5. Polyarthralgia: Continue HEP as tolerated. Continue current medication regimen. Continue to monitor. 06/15/2023 6. Right Wrist Pain: Denies falling, she will F/U with her Orthopedist she reports. Continue to monitor,'  F/U in 1 month

## 2023-06-23 ENCOUNTER — Ambulatory Visit (INDEPENDENT_AMBULATORY_CARE_PROVIDER_SITE_OTHER): Payer: Medicare Other

## 2023-06-23 DIAGNOSIS — I428 Other cardiomyopathies: Secondary | ICD-10-CM

## 2023-06-27 NOTE — Progress Notes (Signed)
Cardiology Office Note:  .   Date:  07/04/2023  ID:  Carmine Savoy, DOB 07-Nov-1950, MRN [id number] PCP: Rodrigo Ran, MD  [facility] Providers Cardiologist:  Lance Muss, MD Electrophysiologist:  Sherryl Manges, MD    History of Present Illness: .   Crystal [name] is a 72 y.o. female  with a past medical history of NICM dating back to 2005 with reported LVEF of 24% at that time, BiV AICD,  anxiety, CHF, bradycardia hypertension, hyperlipidemia, previous syncope, myocardial infarction and LBBB, no CAD on cath 2005.? Angioedema on ACEI but tolerated ARB, CKD.  Cardiac catheterization 11/19 which showed proximal LAD 25% stenosis, ostial first diagonal lesion 25% stenosis, mid left main lesion was 10% stenosis, LV end-diastolic pressure mildly elevated. LVEDP 17 mmHg, no aortic valve stenosis. Hemodynamic findings consistent with mild pulmonary hypertension.  Echocardiogram 09/2018 showed severely reduced systolic function 25 to 30%.  Echo 10/2022 normal LVEF.  Patient comes in for yearly f/u. She has chronic DOE when she walks fast or goes upstairs. Denies chest pain, dizziness, edema. Watches salt but does get extra.  ROS:    Studies Reviewed: Marland Kitchen         Prior CV Studies:   Echo 10/2022 IMPRESSIONS     1. Left ventricular ejection fraction, by estimation, is 60 to 65%. The  left ventricle has normal function. The left ventricle has no regional  wall motion abnormalities. There is mild left ventricular hypertrophy.  Left ventricular diastolic parameters  are consistent with Grade I diastolic dysfunction (impaired relaxation).   2. Right ventricular systolic function is normal. The right ventricular  size is normal. There is normal pulmonary artery systolic pressure. The  estimated right ventricular systolic pressure is 27.8 mmHg.   3. The mitral valve is normal in structure. Trivial mitral valve  regurgitation. No evidence of mitral stenosis.   4. The aortic valve is  grossly normal. There is mild thickening of the  aortic valve. Aortic valve regurgitation is not visualized. No aortic  stenosis is present.   5. The inferior vena cava is normal in size with greater than 50%  respiratory variability, suggesting right atrial pressure of 3 mmHg.    Risk Assessment/Calculations:             Physical Exam:   VS:  BP 122/62   Pulse 73   Ht 5\' 4"  (1.626 m)   Wt 246 lb 6.4 oz (111.8 kg)   SpO2 93%   BMI 42.29 kg/m    Wt Readings from Last 3 Encounters:  07/04/23 246 lb 6.4 oz (111.8 kg)  07/04/23 246 lb 6.4 oz (111.8 kg)  06/15/23 250 lb (113.4 kg)    GEN: Obese, in no acute distress NECK: No JVD; No carotid bruits CARDIAC:  RRR, no murmurs, rubs, gallops RESPIRATORY:  Clear to auscultation without rales, wheezing or rhonchi  ABDOMEN: Soft, non-tender, non-distended EXTREMITIES:  No edema; No deformity   ASSESSMENT AND PLAN: .    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 Dyke Maes today by Ms.Ollis and device functioning normally.  History of Atrial tachycardia  HTN-BP well controlled  CKD stage 3. Last Crt 2.67 06/01/23 followed by renal  HLD-LDL  51 12/2021 but she thinks PCP checked them this year. She will verify. Continue crestor.  LBBB        Dispo: f/u Dr.  Shumann in 6 months to get established.  Signed, Jacolyn Reedy, PA-C

## 2023-06-29 LAB — CUP PACEART REMOTE DEVICE CHECK
Battery Remaining Longevity: 54 mo
Battery Remaining Percentage: 58 %
Brady Statistic RA Percent Paced: 0 %
Brady Statistic RV Percent Paced: 98 %
Date Time Interrogation Session: 20241130104200
HighPow Impedance: 78 Ohm
Lead Channel Impedance Value: 482 Ohm
Lead Channel Impedance Value: 550 Ohm
Lead Channel Impedance Value: 879 Ohm
Lead Channel Setting Pacing Amplitude: 2 V
Lead Channel Setting Pacing Amplitude: 2.5 V
Lead Channel Setting Pacing Amplitude: 3 V
Lead Channel Setting Pacing Pulse Width: 0.4 ms
Lead Channel Setting Pacing Pulse Width: 1 ms
Lead Channel Setting Sensing Sensitivity: 0.5 mV
Lead Channel Setting Sensing Sensitivity: 1 mV
Pulse Gen Serial Number: 227627

## 2023-07-03 NOTE — Progress Notes (Unsigned)
  Electrophysiology Office Note:   Date:  07/03/2023  ID:  Carmine Savoy, DOB 17-Jun-1951, MRN [id number]  Primary Cardiologist: Lance Muss, MD Electrophysiologist: Sherryl Manges, MD  {Click to update primary MD,subspecialty MD or APP then REFRESH:1}    History of Present Illness:   Crystal [name] is a 72 y.o. female with h/o  LBBB, HFrEF / NICM s/p BiV ICD, HTN, HLD, non-obs CAD, CKD, GERD DM II, & hypothyroidism seen today for routine electrophysiology followup.   Since last being seen in our clinic the patient reports doing ***.  she denies chest pain, palpitations, dyspnea, PND, orthopnea, nausea, vomiting, dizziness, syncope, edema, weight gain, or early satiety.   Review of systems complete and found to be negative unless listed in HPI.   EP Information / Studies Reviewed:    EKG is not ordered today. EKG from 12/27/22 reviewed which showed AS, VP 74 bpm      ICD Interrogation-  reviewed in detail today,  See PACEART report.  Device History: [employer] BiV ICD upgrade 06/22/2018 for NICM. Initial ICD implant ~ 2005 History of appropriate therapy: No History of AAD therapy: No   Studies:  ECHO 05/2018 > LVEF 25-30% LHC 05/2018 > non-obs CAD ECHO 11/2019 > LVEF 55-60%    Arrhythmia / AAD AT    Risk Assessment/Calculations:     No BP recorded.  {Refresh Note OR Click here to enter BP  :1}***        Physical Exam:   VS:  There were no vitals taken for this visit.   Wt Readings from Last 3 Encounters:  06/15/23 250 lb (113.4 kg)  06/05/23 250 lb (113.4 kg)  05/29/23 251 lb (113.9 kg)     GEN: Well nourished, well developed in no acute distress NECK: No JVD; No carotid bruits CARDIAC: {EPRHYTHM:28826}, no murmurs, rubs, gallops RESPIRATORY:  Clear to auscultation without rales, wheezing or rhonchi  ABDOMEN: Soft, non-tender, non-distended EXTREMITIES:  No edema; No deformity   ASSESSMENT AND PLAN:    Chronic Systolic Dysfunction / NICM s/p [employer] CRT-D  LBBB Note allergy to ACE, angioedema. Baseline wt ~ 250-255lbs -euvolemic today -Stable on an appropriate medical regimen -Normal ICD function -See Pace Art report -No changes today -continue coreg 25mg  BID  ***  -demadex/farxiga per Cardiology   Atrial Tachycardia  -***% burden on device   OSA  -continue CPAP, compliance encouraged   Disposition:   Follow up with {EPPROVIDERS:28135} {EPFOLLOW MV:78469}   Signed, Canary Brim, MSN, APRN, NP-C, AGACNP-BC [facility] - Electrophysiology  07/03/2023, 8:55 AM

## 2023-07-04 ENCOUNTER — Encounter: Payer: Self-pay | Admitting: Pulmonary Disease

## 2023-07-04 ENCOUNTER — Ambulatory Visit: Payer: Medicare Other | Admitting: Physician Assistant

## 2023-07-04 ENCOUNTER — Ambulatory Visit (INDEPENDENT_AMBULATORY_CARE_PROVIDER_SITE_OTHER): Payer: Medicare Other | Admitting: Pulmonary Disease

## 2023-07-04 ENCOUNTER — Ambulatory Visit: Payer: Medicare Other | Attending: Interventional Cardiology | Admitting: Physician Assistant

## 2023-07-04 ENCOUNTER — Encounter: Payer: Self-pay | Admitting: Physician Assistant

## 2023-07-04 ENCOUNTER — Ambulatory Visit: Payer: Medicare Other | Admitting: Interventional Cardiology

## 2023-07-04 VITALS — BP 122/62 | HR 73 | Ht 64.0 in | Wt 246.4 lb

## 2023-07-04 DIAGNOSIS — N1832 Chronic kidney disease, stage 3b: Secondary | ICD-10-CM | POA: Diagnosis not present

## 2023-07-04 DIAGNOSIS — I428 Other cardiomyopathies: Secondary | ICD-10-CM | POA: Insufficient documentation

## 2023-07-04 DIAGNOSIS — I447 Left bundle-branch block, unspecified: Secondary | ICD-10-CM | POA: Insufficient documentation

## 2023-07-04 DIAGNOSIS — G4733 Obstructive sleep apnea (adult) (pediatric): Secondary | ICD-10-CM

## 2023-07-04 DIAGNOSIS — I2510 Atherosclerotic heart disease of native coronary artery without angina pectoris: Secondary | ICD-10-CM | POA: Insufficient documentation

## 2023-07-04 DIAGNOSIS — I2583 Coronary atherosclerosis due to lipid rich plaque: Secondary | ICD-10-CM | POA: Diagnosis not present

## 2023-07-04 DIAGNOSIS — E785 Hyperlipidemia, unspecified: Secondary | ICD-10-CM | POA: Insufficient documentation

## 2023-07-04 DIAGNOSIS — Z95810 Presence of automatic (implantable) cardiac defibrillator: Secondary | ICD-10-CM | POA: Insufficient documentation

## 2023-07-04 DIAGNOSIS — Z8679 Personal history of other diseases of the circulatory system: Secondary | ICD-10-CM | POA: Insufficient documentation

## 2023-07-04 DIAGNOSIS — I10 Essential (primary) hypertension: Secondary | ICD-10-CM | POA: Insufficient documentation

## 2023-07-04 DIAGNOSIS — M25531 Pain in right wrist: Secondary | ICD-10-CM | POA: Diagnosis not present

## 2023-07-04 NOTE — Patient Instructions (Signed)
Medication Instructions:  No changes *If you need a refill on your cardiac medications before your next appointment, please call your pharmacy*   Lab Work: No Labs If you have labs (blood work) drawn today and your tests are completely normal, you will receive your results only by: MyChart Message (if you have MyChart) OR A paper copy in the mail If you have any lab test that is abnormal or we need to change your treatment, we will call you to review the results.   Testing/Procedures: No Testing   Follow-Up: At [facility], you and your health needs are our priority.  As part of our continuing mission to provide you with exceptional heart care, we have created designated Provider Care Teams.  These Care Teams include your primary Cardiologist (physician) and Advanced Practice Providers (APPs -  Physician Assistants and Nurse Practitioners) who all work together to provide you with the care you need, when you need it.  We recommend signing up for the patient portal called "MyChart".  Sign up information is provided on this After Visit Summary.  MyChart is used to connect with patients for Virtual Visits (Telemedicine).  Patients are able to view lab/test results, encounter notes, upcoming appointments, etc.  Non-urgent messages can be sent to your provider as well.   To learn more about what you can do with MyChart, go to [email or web address].    Your next appointment:   6 month(s)  Provider:   Epifanio Lesches, MD

## 2023-07-04 NOTE — Patient Instructions (Addendum)
Medication Instructions:   Your physician recommends that you continue on your current medications as directed. Please refer to the Current Medication list given to you today.   *If you need a refill on your cardiac medications before your next appointment, please call your pharmacy*   Lab Work: NONE ORDERED  TODAY    If you have labs (blood work) drawn today and your tests are completely normal, you will receive your results only by: MyChart Message (if you have MyChart) OR A paper copy in the mail If you have any lab test that is abnormal or we need to change your treatment, we will call you to review the results.   Testing/Procedures: NONE ORDERED  TODAY    Follow-Up: At [facility], you and your health needs are our priority.  As part of our continuing mission to provide you with exceptional heart care, we have created designated Provider Care Teams.  These Care Teams include your primary Cardiologist (physician) and Advanced Practice Providers (APPs -  Physician Assistants and Nurse Practitioners) who all work together to provide you with the care you need, when you need it.  We recommend signing up for the patient portal called "MyChart".  Sign up information is provided on this After Visit Summary.  MyChart is used to connect with patients for Virtual Visits (Telemedicine).  Patients are able to view lab/test results, encounter notes, upcoming appointments, etc.  Non-urgent messages can be sent to your provider as well.   To learn more about what you can do with MyChart, go to [email or web address].    Your next appointment:   6 month(s)  Provider:   You may see Sherryl Manges, MD or one of the following Advanced Practice Providers on your designated Care Team:   Francis Dowse, [place] Other Instructions

## 2023-07-06 ENCOUNTER — Ambulatory Visit: Payer: Self-pay

## 2023-07-06 NOTE — Progress Notes (Signed)
Subjective:    Patient ID: Crystal [name], female    DOB: 12-15-1950, 72 y.o.   MRN: [id number]  HPI: Crystal [name] is a 72 y.o. female who returns for follow up appointment for chronic pain and medication refill. She states her pain is located in her neck, lower back and bilateral knee pain. Crystal [name] reports she is receiving 3- 31/2 hours of pain relief with her current medication regimen and noticed increased pain in her neck, lower back and knees with weather changes.  She rates her pain 7. Her current exercise regime is walking and performing stretching exercises.  Crystal [name] Morphine equivalent is 33.75 MME.   Last Oral Swab was Performed on 03/29/2023, it was consistent.    Pain Inventory Average Pain 9 Pain Right Now 7 My pain is sharp, burning, stabbing, and aching  In the last 24 hours, has pain interfered with the following? General activity 8 Relation with others 8 Enjoyment of life 7 What TIME of day is your pain at its worst? morning , evening, and night Sleep (in general) Good  Pain is worse with: walking, bending, standing, and some activites Pain improves with: rest and medication Relief from Meds: 7  Family History  Problem Relation Age of Onset   Hypertension Father    Heart disease Father    Cancer Father        prostate   Heart disease Other        (Maternal side) Ischemic heart disease   Diabetes Mellitus II Other    Arrhythmia Mother    Diabetes Mellitus II Mother        Borderline DM   Hypertension Mother    Asthma Mother    Cancer Brother    Dementia Brother    Hypertension Brother    Hypertension Daughter    Hypertension Daughter    Diabetes Daughter    Hypertension Daughter    Atrial fibrillation Daughter    GER disease Daughter    Hypertension Son    Anxiety disorder Son    Hypothyroidism Son    Breast cancer Maternal Grandmother        in her 63's   Social History   Socioeconomic History   Marital status: Widowed    Spouse  name: Not on file   Number of children: Not on file   Years of education: Not on file   Highest education level: Not on file  Occupational History    Comment: retired LPN  Tobacco Use   Smoking status: Never   Smokeless tobacco: Never  Vaping Use   Vaping status: Never Used  Substance and Sexual Activity   Alcohol use: Not Currently   Drug use: Never   Sexual activity: Not Currently  Other Topics Concern   Not on file  Social History Narrative   [place].  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      As of 03/15/17:   Diet: 1800 Calorie      Caffeine: Yes      Married, if yes what year: Widowed, married 1973      Do you live in a house, apartment, assisted living, [place], trailer, ect: House, 1 stories, and 1 person      Pets: No      Current/Past profession: LPN, retired       Exercise: Yes, walking  Living Will: Yes   DNR: No   POA/HPOA: Yes      Functional Status:   Do you have difficulty bathing or dressing yourself? No   Do you have difficulty preparing food or eating? No   Do you have difficulty managing your medications? No   Do you have difficulty managing your finances? No   Do you have difficulty affording your medications? Yes   Social Drivers of Corporate investment banker Strain: Low Risk  (10/11/2017)   Overall Financial Resource Strain (CARDIA)    Difficulty of Paying Living Expenses: Not hard at all  Food Insecurity: No Food Insecurity (07/06/2023)   Hunger Vital Sign    Worried About Running Out of Food in the Last Year: Never true    Ran Out of Food in the Last Year: Never true  Transportation Needs: No Transportation Needs (07/06/2023)   PRAPARE - Administrator, Civil Service (Medical): No    Lack of Transportation (Non-Medical): No  Physical Activity: Inactive (10/11/2017)   Exercise Vital Sign    Days of Exercise per Week: 0 days    Minutes of Exercise  per Session: 0 min  Stress: Stress Concern Present (10/11/2017)   [name] - Occupational Stress Questionnaire    Feeling of Stress : To some extent  Social Connections: Somewhat Isolated (10/11/2017)   Social Connection and Isolation Panel [NHANES]    Frequency of Communication with Friends and Family: More than three times a week    Frequency of Social Gatherings with Friends and Family: More than three times a week    Attends Religious Services: More than 4 times per year    Active Member of [employer] or Organizations: No    Attends Banker Meetings: Never    Marital Status: Widowed   Past Surgical History:  Procedure Laterality Date   APPENDECTOMY  07/13/2017   BLADDER SUSPENSION  1990   "w/hysterectomy"   BOWEL RESECTION N/A 07/09/2018   Procedure: SMALL BOWEL RESECTION;  Surgeon: Jimmye Norman, MD;  [facility]: [facility];  Service: General;  Laterality: N/A;   CARDIAC CATHETERIZATION  2005   no obstructive CAD per patient   CARDIAC CATHETERIZATION  2018   CARDIAC DEFIBRILLATOR PLACEMENT  2005   BiV ICD implanted,  LV lead is an epicardial lead   CARPAL TUNNEL RELEASE Left 07/25/2014   Dr.Williamson    CARPAL TUNNEL RELEASE Right 04/08/2019   Procedure: RIGHT CARPAL TUNNEL RELEASE ENDOSCOPIC;  Surgeon: Mack Hook, MD;  Location[facility] OR;  Service: Orthopedics;  Laterality: Right;   CATARACT EXTRACTION W/ INTRAOCULAR LENS  IMPLANT, BILATERAL Bilateral    COLONIC STENT PLACEMENT N/A 08/31/2017   Procedure: COLONIC STENT PLACEMENT;  Surgeon: Jeani Hawking, MD;  Location: [facility] ENDOSCOPY;  Service: Endoscopy;  Laterality: N/A;   COLONOSCOPY     2010-2011 Dr.Kipreos    COLOSTOMY  12/2016   Hattie Perch 01/26/2017   COLOSTOMY REVERSAL N/A 07/13/2017   Procedure: COLOSTOMY REVERSAL;  Surgeon: Jimmye Norman, MD;  [facility];  Service: General;  Laterality: N/A;   CYST REMOVAL HAND Right 05/2013   thumb   DILATION AND CURETTAGE OF UTERUS  X 5-6    ESOPHAGOGASTRODUODENOSCOPY (EGD) WITH PROPOFOL N/A 03/13/2020   Procedure: ESOPHAGOGASTRODUODENOSCOPY (EGD) WITH PROPOFOL;  Surgeon: Jeani Hawking, MD;  Location: [facility] ENDOSCOPY;  Service: Endoscopy;  Laterality: N/A;   EXCISION MASS ABDOMINAL N/A 07/09/2018   Procedure: EXPLORATION OF  ABDOMINAL WOUND ERAS PATHWAY;  Surgeon: Jimmye Norman,  MD;  [facility] OR;  Service: General;  Laterality: N/A;   FLEXIBLE SIGMOIDOSCOPY N/A 08/24/2017   Procedure: Arnell Sieving;  Surgeon: Jeani Hawking, MD;  Location: [facility] ENDOSCOPY;  Service: Endoscopy;  Laterality: N/A;   FLEXIBLE SIGMOIDOSCOPY N/A 08/31/2017   Procedure: FLEXIBLE SIGMOIDOSCOPY;  Surgeon: Jeani Hawking, MD;  Location: [facility] ENDOSCOPY;  Service: Endoscopy;  Laterality: N/A;  stent placement   FLEXIBLE SIGMOIDOSCOPY N/A 03/13/2020   Procedure: FLEXIBLE SIGMOIDOSCOPY;  Surgeon: Jeani Hawking, MD;  [facility] ENDOSCOPY;  Service: Endoscopy;  Laterality: N/A;   FLEXIBLE SIGMOIDOSCOPY N/A 12/18/2020   Procedure: FLEXIBLE SIGMOIDOSCOPY;  Surgeon: Jeani Hawking, MD;  [facility] ENDOSCOPY;  Service: Endoscopy;  Laterality: N/A;   HEMOSTASIS CLIP PLACEMENT  03/13/2020   Procedure: HEMOSTASIS CLIP PLACEMENT;  Surgeon: Jeani Hawking, MD;  [facility] ENDOSCOPY;  Service: Endoscopy;;   HEMOSTASIS CLIP PLACEMENT  12/18/2020   Procedure: HEMOSTASIS CLIP PLACEMENT;  Surgeon: Jeani Hawking, MD;  Location: [facility] ENDOSCOPY;  Service: Endoscopy;;   HOT HEMOSTASIS N/A 03/13/2020   Procedure: HOT HEMOSTASIS (ARGON PLASMA COAGULATION/BICAP);  Surgeon: Jeani Hawking, MD;  Location: [facility] ENDOSCOPY;  Service: Endoscopy;  Laterality: N/A;   HOT HEMOSTASIS N/A 12/18/2020   Procedure: HOT HEMOSTASIS (ARGON PLASMA COAGULATION/BICAP);  Surgeon: Jeani Hawking, MD;  Location: [facility] ENDOSCOPY;  Service: Endoscopy;  Laterality: N/A;   IMPLANTABLE CARDIOVERTER DEFIBRILLATOR GENERATOR CHANGE  2008   INSERTION OF MESH N/A 07/09/2018   Procedure: INSERTION OF VICRYL MESH;  Surgeon: Jimmye Norman, MD;  [facility] OR;  Service: General;  Laterality: N/A;   LAPAROTOMY N/A 09/18/2017   Procedure: EXPLORATORY LAPAROTOMY;  Surgeon: Jimmye Norman, MD;  Location: [facility] OR;  Service: General;  Laterality: N/A;   LEAD REVISION  06/22/2018   LEAD REVISION/REPAIR N/A 06/22/2018   Procedure: LEAD REVISION/REPAIR;  Surgeon: Duke Salvia, MD;  Location: [facility] INVASIVE CV LAB;  Service: Cardiovascular;  Laterality: N/A;   LYSIS OF ADHESION N/A 09/18/2017   Procedure: LYSIS OF ADHESION;  Surgeon: Jimmye Norman, MD;  Location: [facility] OR;  Service: General;  Laterality: N/A;   LYSIS OF ADHESION N/A 07/09/2018   Procedure: LYSIS OF ADHESION;  Surgeon: Jimmye Norman, MD;  Location: [facility] OR;  Service: General;  Laterality: N/A;   PARTIAL COLECTOMY N/A 09/18/2017   Procedure: ILEOCOLECTOMY;  Surgeon: Jimmye Norman, MD;  Location: [facility] OR;  Service: General;  Laterality: N/A;   PARTIAL COLECTOMY  09/25/2017   POLYPECTOMY  03/13/2020   Procedure: POLYPECTOMY;  Surgeon: Jeani Hawking, MD;  Location[facility] ENDOSCOPY;  Service: Endoscopy;;   RIGHT/LEFT HEART CATH AND CORONARY ANGIOGRAPHY N/A 06/01/2018   Procedure: RIGHT/LEFT HEART CATH AND CORONARY ANGIOGRAPHY;  Surgeon: Corky Crafts, MD;  Location: [facility] INVASIVE CV LAB;  Service: Cardiovascular;  Laterality: N/A;   SIGMOIDOSCOPY N/A 09/18/2017   Procedure: SIGMOIDOSCOPY;  Surgeon: Jimmye Norman, MD;  [facility] OR;  Service: General;  Laterality: N/A;   SMALL INTESTINE SURGERY  07/09/2018   EXPLORATION OF  ABDOMINAL WOUND ERAS PATHWAYN/; MESH; LYSIS OF ADHESIONS   TOTAL ABDOMINAL HYSTERECTOMY  1990   TRANSFORAMINAL LUMBAR INTERBODY FUSION (TLIF) WITH PEDICLE SCREW FIXATION 2 LEVEL N/A 01/07/2020   Procedure: Lumbar Four-Five and Lumbar Five-Sacral One open lumbar decompression and transforaminal lumbar interbody fusion;  Surgeon: Jadene Pierini, MD;  Location: [facility] OR;  Service: Neurosurgery;  Laterality: N/A;  Lumbar Four-Five and Lumbar Five-Sacral One open lumbar  decompression and transforaminal lumbar interbody fusion   TRIGGER FINGER RELEASE Right 05/213   TRIGGER FINGER RELEASE Right 04/08/2019  Procedure: RIGHT INDEX RELEASE TRIGGER FINGER/A-1 PULLEY;  Surgeon: Mack Hook, MD;  Location: [facility] OR;  Service: Orthopedics;  Laterality: Right;   TUBAL LIGATION  1980s   VESICOVAGINAL FISTULA CLOSURE W/ TAH     Past Surgical History:  Procedure Laterality Date   APPENDECTOMY  07/13/2017   BLADDER SUSPENSION  1990   "w/hysterectomy"   BOWEL RESECTION N/A 07/09/2018   Procedure: SMALL BOWEL RESECTION;  Surgeon: Jimmye Norman, MD;  [facility];  Service: General;  Laterality: N/A;   CARDIAC CATHETERIZATION  2005   no obstructive CAD per patient   CARDIAC CATHETERIZATION  2018   CARDIAC DEFIBRILLATOR PLACEMENT  2005   BiV ICD implanted,  LV lead is an epicardial lead   CARPAL TUNNEL RELEASE Left 07/25/2014   Dr.Williamson    CARPAL TUNNEL RELEASE Right 04/08/2019   Procedure: RIGHT CARPAL TUNNEL RELEASE ENDOSCOPIC;  Surgeon: Mack Hook, MD;  Location: [facility] OR;  Service: Orthopedics;  Laterality: Right;   CATARACT EXTRACTION W/ INTRAOCULAR LENS  IMPLANT, BILATERAL Bilateral    COLONIC STENT PLACEMENT N/A 08/31/2017   Procedure: COLONIC STENT PLACEMENT;  Surgeon: Jeani Hawking, MD;  Location: [facility] ENDOSCOPY;  Service: Endoscopy;  Laterality: N/A;   COLONOSCOPY     2010-2011 Dr.Kipreos    COLOSTOMY  12/2016   Hattie Perch 01/26/2017   COLOSTOMY REVERSAL N/A 07/13/2017   Procedure: COLOSTOMY REVERSAL;  Surgeon: Jimmye Norman, MD;  [facility] OR;  Service: General;  Laterality: N/A;   CYST REMOVAL HAND Right 05/2013   thumb   DILATION AND CURETTAGE OF UTERUS  X 5-6   ESOPHAGOGASTRODUODENOSCOPY (EGD) WITH PROPOFOL N/A 03/13/2020   Procedure: ESOPHAGOGASTRODUODENOSCOPY (EGD) WITH PROPOFOL;  Surgeon: Jeani Hawking, MD;  [facility] ENDOSCOPY;  Service: Endoscopy;  Laterality: N/A;   EXCISION MASS ABDOMINAL N/A 07/09/2018   Procedure: EXPLORATION OF   ABDOMINAL WOUND ERAS PATHWAY;  Surgeon: Jimmye Norman, MD;  Location: [facility] OR;  Service: General;  Laterality: N/A;   FLEXIBLE SIGMOIDOSCOPY N/A 08/24/2017   Procedure: Arnell Sieving;  Surgeon: Jeani Hawking, MD;  Location: [facility] ENDOSCOPY;  Service: Endoscopy;  Laterality: N/A;   FLEXIBLE SIGMOIDOSCOPY N/A 08/31/2017   Procedure: FLEXIBLE SIGMOIDOSCOPY;  Surgeon: Jeani Hawking, MD;  Location: [facility] ENDOSCOPY;  Service: Endoscopy;  Laterality: N/A;  stent placement   FLEXIBLE SIGMOIDOSCOPY N/A 03/13/2020   Procedure: FLEXIBLE SIGMOIDOSCOPY;  Surgeon: Jeani Hawking, MD;  Location[facility] ENDOSCOPY;  Service: Endoscopy;  Laterality: N/A;   FLEXIBLE SIGMOIDOSCOPY N/A 12/18/2020   Procedure: FLEXIBLE SIGMOIDOSCOPY;  Surgeon: Jeani Hawking, MD;  Location: [facility] ENDOSCOPY;  Service: Endoscopy;  Laterality: N/A;   HEMOSTASIS CLIP PLACEMENT  03/13/2020   Procedure: HEMOSTASIS CLIP PLACEMENT;  Surgeon: Jeani Hawking, MD;  Location: [facility] ENDOSCOPY;  Service: Endoscopy;;   HEMOSTASIS CLIP PLACEMENT  12/18/2020   Procedure: HEMOSTASIS CLIP PLACEMENT;  Surgeon: Jeani Hawking, MD;  Location: [facility] ENDOSCOPY;  Service: Endoscopy;;   HOT HEMOSTASIS N/A 03/13/2020   Procedure: HOT HEMOSTASIS (ARGON PLASMA COAGULATION/BICAP);  Surgeon: Jeani Hawking, MD;  Location: [facility] ENDOSCOPY;  Service: Endoscopy;  Laterality: N/A;   HOT HEMOSTASIS N/A 12/18/2020   Procedure: HOT HEMOSTASIS (ARGON PLASMA COAGULATION/BICAP);  Surgeon: Jeani Hawking, MD;  Location: [facility] ENDOSCOPY;  Service: Endoscopy;  Laterality: N/A;   IMPLANTABLE CARDIOVERTER DEFIBRILLATOR GENERATOR CHANGE  2008   INSERTION OF MESH N/A 07/09/2018   Procedure: INSERTION OF VICRYL MESH;  Surgeon: Jimmye Norman, MD;  Location[facility] OR;  Service: General;  Laterality: N/A;   LAPAROTOMY N/A 09/18/2017   Procedure: EXPLORATORY LAPAROTOMY;  Surgeon: Lindie Spruce,  Fayrene Fearing, MD;  Location: [facility] OR;  Service: General;  Laterality: N/A;   LEAD REVISION  06/22/2018   LEAD REVISION/REPAIR N/A 06/22/2018    Procedure: LEAD REVISION/REPAIR;  Surgeon: Duke Salvia, MD;  Location: [facility] INVASIVE CV LAB;  Service: Cardiovascular;  Laterality: N/A;   LYSIS OF ADHESION N/A 09/18/2017   Procedure: LYSIS OF ADHESION;  Surgeon: Jimmye Norman, MD;  Location: [facility] OR;  Service: General;  Laterality: N/A;   LYSIS OF ADHESION N/A 07/09/2018   Procedure: LYSIS OF ADHESION;  Surgeon: Jimmye Norman, MD;  Location: [facility] OR;  Service: General;  Laterality: N/A;   PARTIAL COLECTOMY N/A 09/18/2017   Procedure: ILEOCOLECTOMY;  Surgeon: Jimmye Norman, MD;  Location: [facility] OR;  Service: General;  Laterality: N/A;   PARTIAL COLECTOMY  09/25/2017   POLYPECTOMY  03/13/2020   Procedure: POLYPECTOMY;  Surgeon: Jeani Hawking, MD;  Location: [facility] ENDOSCOPY;  Service: Endoscopy;;   RIGHT/LEFT HEART CATH AND CORONARY ANGIOGRAPHY N/A 06/01/2018   Procedure: RIGHT/LEFT HEART CATH AND CORONARY ANGIOGRAPHY;  Surgeon: Corky Crafts, MD;  Location: [facility] INVASIVE CV LAB;  Service: Cardiovascular;  Laterality: N/A;   SIGMOIDOSCOPY N/A 09/18/2017   Procedure: SIGMOIDOSCOPY;  Surgeon: Jimmye Norman, MD;  Location[facility] OR;  Service: General;  Laterality: N/A;   SMALL INTESTINE SURGERY  07/09/2018   EXPLORATION OF  ABDOMINAL WOUND ERAS PATHWAYN/; MESH; LYSIS OF ADHESIONS   TOTAL ABDOMINAL HYSTERECTOMY  1990   TRANSFORAMINAL LUMBAR INTERBODY FUSION (TLIF) WITH PEDICLE SCREW FIXATION 2 LEVEL N/A 01/07/2020   Procedure: Lumbar Four-Five and Lumbar Five-Sacral One open lumbar decompression and transforaminal lumbar interbody fusion;  Surgeon: Jadene Pierini, MD;  Location: [facility] OR;  Service: Neurosurgery;  Laterality: N/A;  Lumbar Four-Five and Lumbar Five-Sacral One open lumbar decompression and transforaminal lumbar interbody fusion   TRIGGER FINGER RELEASE Right 05/213   TRIGGER FINGER RELEASE Right 04/08/2019   Procedure: RIGHT INDEX RELEASE TRIGGER FINGER/A-1 PULLEY;  Surgeon: Mack Hook, MD;  Location: [facility] OR;  Service: Orthopedics;  Laterality:  Right;   TUBAL LIGATION  1980s   VESICOVAGINAL FISTULA CLOSURE W/ TAH     Past Medical History:  Diagnosis Date   AICD (automatic cardioverter/defibrillator) present    high RV threshold chronically, device was turned off in 2014; Device battery has been dead x 7 years; "turned it back on 06/22/2018"   Anemia, iron deficiency    Angioedema    felt to likely be due to ace inhibitors but says she has had this even off of medicines, appears to be tolerating ARBs chronically   Anxiety    Arthritis    "hands, legs, arms; bad in my back" (06/22/2018)   Asthmatic bronchitis with status asthmaticus    Bradycardia    CHF (congestive heart failure) (HCC)    Chronic lower back pain    Chronic pain    CKD (chronic kidney disease), stage III (HCC)    Coronary artery disease    Mild nonobstructive (30% LAD, 30% RCA) by 09/01/16 cath at [facility]   Degenerative joint disease    Depression    Diabetes mellitus, type 2 (HCC)    Diabetic peripheral neuropathy (HCC)    Dizziness    Dyspnea on exertion    Family history of adverse reaction to anesthesia    Mother has nausea   GERD (gastroesophageal reflux disease)    Gout    Heart valve disorder    History of blood transfusion 1981; ~ 2005; 12/2016   "childbirth; defibrillator OR; colostomy  OR"   History of mononucleosis 03/2014   Hyperlipidemia    Hypertension    Hypothyroid    Insomnia    Intervertebral disc degeneration    Ischemic cardiomyopathy    LBBB (left bundle branch block)    Myocardial infarction (HCC) dx'd ~ 2005   Nonischemic cardiomyopathy (HCC)    s/p ICD in 2005. EF has since recovered   Obesity    On home oxygen therapy    "2L at night" (06/22/2018)   OSA on CPAP    Pneumonia    "couple times; last time was 12/2016" (06/22/2018)   PONV (postoperative nausea and vomiting)    Presence of permanent cardiac pacemaker    [employer]   Swelling    Syncope    Systemic hypertension    Vitamin D deficiency     There were no vitals taken for this visit.  Opioid Risk Score:   Fall Risk Score:  `1  Depression screen [facility] 2/9     06/15/2023   11:10 AM 03/29/2023   11:15 AM 02/23/2023   10:54 AM 02/23/2023   10:47 AM 01/23/2023   11:30 AM 10/26/2022   11:03 AM 09/27/2022   11:03 AM  Depression screen [facility] 2/9  Decreased Interest 0 0 0 0 0 0 0  Down, Depressed, Hopeless 0 0 0 0 0 0 0  PHQ - 2 Score 0 0 0 0 0 0 0    Review of Systems  Musculoskeletal:  Positive for back pain, gait problem and neck pain.  All other systems reviewed and are negative.      Objective:   Physical Exam Vitals and nursing note reviewed.  Constitutional:      Appearance: Normal appearance.  Neck:     Comments: Cervical Paraspinal Tenderness: C-5-C-6  Cardiovascular:     Rate and Rhythm: Normal rate and regular rhythm.     Pulses: Normal pulses.     Heart sounds: Normal heart sounds.  Pulmonary:     Effort: Pulmonary effort is normal.     Breath sounds: Normal breath sounds.  Musculoskeletal:     Comments: Normal Muscle Bulk and Muscle Testing Reveals:  Upper Extremities: Full ROM and Muscle Strength 5/5 Thoracic Paraspinal Tenderness: T-1-T-2 Lumbar Paraspinal Tenderness: L-3-L-5 Lower Extremities: Full ROM and Muscle Strength 5/5 Arises from Table slowly using walker for support Antalgic Gait     Skin:    General: Skin is warm and dry.  Neurological:     Mental Status: She is alert and oriented to person, place, and time.  Psychiatric:        Mood and Affect: Mood normal.        Behavior: Behavior normal.         Assessment & Plan:  Chronic Low Back Pain: Encouraged to increase HEP as Tolerated. Continue to Monitor. 07/10/2023 2. Bilateral Primary Osteoarthritis:  S/P Right Knee Genicular nerve radiofrequency neurotomy, with Dr Wynn Banker. On 08/02/2022 and S/P Left Knee Genicular Nerve Radiofrequency on 08/16/2022. With good relief noted.  : Indication Chronic post operative pain in the Knee, pain  postop total knee replacement which has not responded to conservative management such as physical therapy and medication management; Continue monitor. 07/10/2023 3. Myofascial Pain: Continue HEP as Tolerated. Continue to Monitor.  07/10/2023 4. Chronic Pain Syndrome: Refilled: Increased: Oxycodone 7.5mg  /325 mg one tablet 4 times a day as needed for pain #120. Discontinue Hydrocodone due to itching,  We will continue the opioid monitoring program, this consists of  regular clinic visits, examinations, urine drug screen, pill counts as well as use of [place] Controlled Substance Reporting system. A 12 month History has been reviewed on the [place] Controlled Substance Reporting System on 07/10/2023. 5. Polyarthralgia: Continue HEP as tolerated. Continue current medication regimen. Continue to monitor. 07/10/2023 6. Right Wrist Pain: She reports she received injection by her Orthopedist . Continue to monitor. 07/10/2023   F/U in 1 month

## 2023-07-07 NOTE — Patient Outreach (Signed)
  Care Coordination   Follow Up Visit Note   07/07/2023 Name: Crystal [name] MRN: [id number] DOB: Feb 27, 1951  Crystal [name] is a 72 y.o. year old female who sees Rodrigo Ran, MD for primary care. I spoke with  Crystal [name] by phone today.  What matters to the patients health and wellness today?  Patient would like to keep her chronic conditions under good control.     Goals Addressed             This Visit's Progress    To maintain or improve current kidney function   On track    Care Coordination Interventions: Assessed the Patient understanding of chronic kidney disease    Evaluation of current treatment plan related to chronic kidney disease self management and patient's adherence to plan as established by provider      Provided education on kidney disease progression    Review of patient status, including review of consultant's reports, relevant laboratory and other test results, and medications completed Reviewed and discussed with patient his next scheduled follow up is scheduled with Dr. Malen Gauze, Nephrologist on 08/10/23 Last practice recorded BP readings:  BP Readings from Last 3 Encounters:  07/04/23 122/62  07/04/23 122/62  06/15/23 122/69   Most recent eGFR/CrCl:  Lab Results  Component Value Date   EGFR 27.0 01/24/2023    No components found for: "CRCL"     Interventions Today    Flowsheet Row Most Recent Value  Chronic Disease   Chronic disease during today's visit Chronic Obstructive Pulmonary Disease (COPD), Congestive Heart Failure (CHF)  Dorian Heckle,  Cardiomyopathy]  General Interventions   General Interventions Discussed/Reviewed General Interventions Discussed, General Interventions Reviewed, Doctor Visits, Labs  Doctor Visits Discussed/Reviewed Doctor Visits Discussed, Doctor Visits Reviewed, PCP, Specialist  Exercise Interventions   Exercise Discussed/Reviewed Physical Activity, Exercise Reviewed, Exercise Discussed  Physical Activity  Discussed/Reviewed Physical Activity Reviewed, Physical Activity Discussed, Types of exercise, Gym  Education Interventions   Education Provided Provided Education  Provided Verbal Education On When to see the doctor, Medication, Exercise  Labs Reviewed Kidney Function  Nutrition Interventions   Nutrition Discussed/Reviewed Nutrition Discussed, Fluid intake, Decreasing salt, Nutrition Reviewed  Pharmacy Interventions   Pharmacy Dicussed/Reviewed Pharmacy Topics Reviewed, Pharmacy Topics Discussed, Medications and their functions, Medication Adherence          SDOH assessments and interventions completed:  Yes  SDOH Interventions Today    Flowsheet Row Most Recent Value  SDOH Interventions   Food Insecurity Interventions Intervention Not Indicated  Housing Interventions Intervention Not Indicated  Transportation Interventions Intervention Not Indicated  Utilities Interventions Intervention Not Indicated        Care Coordination Interventions:  Yes, provided   Follow up plan: Follow up call scheduled for 09/06/23 @11 :00 AM    Encounter Outcome:  Patient Visit Completed

## 2023-07-07 NOTE — Patient Instructions (Signed)
Visit Information  Thank you for taking time to visit with me today. Please don't hesitate to contact me if I can be of assistance to you.   Following are the goals we discussed today:   Goals Addressed             This Visit's Progress    To maintain or improve current kidney function   On track    Care Coordination Interventions: Assessed the Patient understanding of chronic kidney disease    Evaluation of current treatment plan related to chronic kidney disease self management and patient's adherence to plan as established by provider      Provided education on kidney disease progression    Review of patient status, including review of consultant's reports, relevant laboratory and other test results, and medications completed Reviewed and discussed with patient his next scheduled follow up is scheduled with Dr. Malen Gauze, Nephrologist on 08/10/23 Last practice recorded BP readings:  BP Readings from Last 3 Encounters:  07/04/23 122/62  07/04/23 122/62  06/15/23 122/69   Most recent eGFR/CrCl:  Lab Results  Component Value Date   EGFR 27.0 01/24/2023    No components found for: "CRCL"         Our next appointment is by telephone on 09/06/23 at 11:00 AM  Please call the care guide team at [phone] if you need to cancel or reschedule your appointment.   If you are experiencing a Mental Health or Behavioral Health Crisis or need someone to talk to, please call 1-800-273-TALK (toll free, 24 hour hotline)  Patient verbalizes understanding of instructions and care plan provided today and agrees to view in MyChart. Active MyChart status and patient understanding of how to access instructions and care plan via MyChart confirmed with patient.     Delsa Sale RN BSN CCM [place]  [facility], [facility] Nurse Care Coordinator  Direct Dial: [phone] Website: [email or web address]

## 2023-07-10 ENCOUNTER — Encounter: Payer: Self-pay | Admitting: Registered Nurse

## 2023-07-10 ENCOUNTER — Encounter: Payer: Medicare Other | Attending: Registered Nurse | Admitting: Registered Nurse

## 2023-07-10 VITALS — BP 119/68 | HR 77 | Ht 64.0 in | Wt 249.0 lb

## 2023-07-10 DIAGNOSIS — M542 Cervicalgia: Secondary | ICD-10-CM

## 2023-07-10 DIAGNOSIS — Z79891 Long term (current) use of opiate analgesic: Secondary | ICD-10-CM

## 2023-07-10 DIAGNOSIS — M7918 Myalgia, other site: Secondary | ICD-10-CM | POA: Diagnosis not present

## 2023-07-10 DIAGNOSIS — G8929 Other chronic pain: Secondary | ICD-10-CM

## 2023-07-10 DIAGNOSIS — G894 Chronic pain syndrome: Secondary | ICD-10-CM | POA: Diagnosis not present

## 2023-07-10 DIAGNOSIS — Z5181 Encounter for therapeutic drug level monitoring: Secondary | ICD-10-CM | POA: Diagnosis not present

## 2023-07-10 DIAGNOSIS — M545 Low back pain, unspecified: Secondary | ICD-10-CM | POA: Insufficient documentation

## 2023-07-10 DIAGNOSIS — M2550 Pain in unspecified joint: Secondary | ICD-10-CM

## 2023-07-10 DIAGNOSIS — M170 Bilateral primary osteoarthritis of knee: Secondary | ICD-10-CM

## 2023-07-10 MED ORDER — OXYCODONE-ACETAMINOPHEN 7.5-325 MG PO TABS: 1.0000 | ORAL_TABLET | Freq: Four times a day (QID) | ORAL | 0 refills | Status: DC | PRN

## 2023-07-17 NOTE — Addendum Note (Signed)
Addended by: Elease Etienne A on: 07/17/2023 09:02 AM   Modules accepted: Orders

## 2023-07-17 NOTE — Progress Notes (Signed)
Remote ICD transmission.   

## 2023-08-03 DIAGNOSIS — N184 Chronic kidney disease, stage 4 (severe): Secondary | ICD-10-CM | POA: Diagnosis not present

## 2023-08-09 NOTE — Progress Notes (Signed)
Subjective:    Patient ID: Crystal [name], female    DOB: 1950/12/25, 73 y.o.   MRN: [id number]  HPI: Crystal [name] is a 73 y.o. female who returns for follow up appointment for chronic pain and medication refill. She states her pain is located in her neck, lower back pain, bilateral knee pain and generalized pain. She rates her pain 8. Her current exercise regime is walking and performing stretching exercises.  Crystal [name] Morphine equivalent is 45.00 MME.   Oral  Swab was Performed today.      Pain Inventory Average Pain 9 Pain Right Now 8 My pain is constant, sharp, burning, stabbing, and aching  In the last 24 hours, has pain interfered with the following? General activity 8 Relation with others 6 Enjoyment of life 7 What TIME of day is your pain at its worst? morning , evening, and night Sleep (in general) Fair  Pain is worse with: walking, bending, standing, and some activites Pain improves with: rest and medication Relief from Meds: 8  Family History  Problem Relation Age of Onset   Hypertension Father    Heart disease Father    Cancer Father        prostate   Heart disease Other        (Maternal side) Ischemic heart disease   Diabetes Mellitus II Other    Arrhythmia Mother    Diabetes Mellitus II Mother        Borderline DM   Hypertension Mother    Asthma Mother    Cancer Brother    Dementia Brother    Hypertension Brother    Hypertension Daughter    Hypertension Daughter    Diabetes Daughter    Hypertension Daughter    Atrial fibrillation Daughter    GER disease Daughter    Hypertension Son    Anxiety disorder Son    Hypothyroidism Son    Breast cancer Maternal Grandmother        in her 36's   Social History   Socioeconomic History   Marital status: Widowed    Spouse name: Not on file   Number of children: Not on file   Years of education: Not on file   Highest education level: Not on file  Occupational History    Comment: retired LPN   Tobacco Use   Smoking status: Never   Smokeless tobacco: Never  Vaping Use   Vaping status: Never Used  Substance and Sexual Activity   Alcohol use: Not Currently   Drug use: Never   Sexual activity: Not Currently  Other Topics Concern   Not on file  Social History Narrative   [place]  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      As of 03/15/17:   Diet: 1800 Calorie      Caffeine: Yes      Married, if yes what year: Widowed, married 1973      Do you live in a house, apartment, assisted living, condo, trailer, ect: House, 1 stories, and 1 person      Pets: No      Current/Past profession: LPN, retired       Exercise: Yes, walking          Living Will: Yes   DNR: No   POA/HPOA: Yes      Functional Status:   Do you have difficulty bathing or dressing yourself? No  Do you have difficulty preparing food or eating? No   Do you have difficulty managing your medications? No   Do you have difficulty managing your finances? No   Do you have difficulty affording your medications? Yes   Social Drivers of Corporate investment banker Strain: Low Risk  (10/11/2017)   Overall Financial Resource Strain (CARDIA)    Difficulty of Paying Living Expenses: Not hard at all  Food Insecurity: No Food Insecurity (07/06/2023)   Hunger Vital Sign    Worried About Running Out of Food in the Last Year: Never true    Ran Out of Food in the Last Year: Never true  Transportation Needs: No Transportation Needs (07/06/2023)   PRAPARE - Administrator, Civil Service (Medical): No    Lack of Transportation (Non-Medical): No  Physical Activity: Inactive (10/11/2017)   Exercise Vital Sign    Days of Exercise per Week: 0 days    Minutes of Exercise per Session: 0 min  Stress: Stress Concern Present (10/11/2017)   [name] - Occupational Stress Questionnaire    Feeling of Stress : To some  extent  Social Connections: Somewhat Isolated (10/11/2017)   Social Connection and Isolation Panel [NHANES]    Frequency of Communication with Friends and Family: More than three times a week    Frequency of Social Gatherings with Friends and Family: More than three times a week    Attends Religious Services: More than 4 times per year    Active Member of [employer] or Organizations: No    Attends Banker Meetings: Never    Marital Status: Widowed   Past Surgical History:  Procedure Laterality Date   APPENDECTOMY  07/13/2017   BLADDER SUSPENSION  1990   "w/hysterectomy"   BOWEL RESECTION N/A 07/09/2018   Procedure: SMALL BOWEL RESECTION;  Surgeon: Jimmye Norman, MD;  [facility];  Service: General;  Laterality: N/A;   CARDIAC CATHETERIZATION  2005   no obstructive CAD per patient   CARDIAC CATHETERIZATION  2018   CARDIAC DEFIBRILLATOR PLACEMENT  2005   BiV ICD implanted,  LV lead is an epicardial lead   CARPAL TUNNEL RELEASE Left 07/25/2014   Dr.Williamson    CARPAL TUNNEL RELEASE Right 04/08/2019   Procedure: RIGHT CARPAL TUNNEL RELEASE ENDOSCOPIC;  Surgeon: Mack Hook, MD;  [facility] OR;  Service: Orthopedics;  Laterality: Right;   CATARACT EXTRACTION W/ INTRAOCULAR LENS  IMPLANT, BILATERAL Bilateral    COLONIC STENT PLACEMENT N/A 08/31/2017   Procedure: COLONIC STENT PLACEMENT;  Surgeon: Jeani Hawking, MD;  Location: [facility] ENDOSCOPY;  Service: Endoscopy;  Laterality: N/A;   COLONOSCOPY     2010-2011 Dr.Kipreos    COLOSTOMY  12/2016   Hattie Perch 01/26/2017   COLOSTOMY REVERSAL N/A 07/13/2017   Procedure: COLOSTOMY REVERSAL;  Surgeon: Jimmye Norman, MD;  [facility] OR;  Service: General;  Laterality: N/A;   CYST REMOVAL HAND Right 05/2013   thumb   DILATION AND CURETTAGE OF UTERUS  X 5-6   ESOPHAGOGASTRODUODENOSCOPY (EGD) WITH PROPOFOL N/A 03/13/2020   Procedure: ESOPHAGOGASTRODUODENOSCOPY (EGD) WITH PROPOFOL;  Surgeon: Jeani Hawking, MD;  Location: [facility] ENDOSCOPY;   Service: Endoscopy;  Laterality: N/A;   EXCISION MASS ABDOMINAL N/A 07/09/2018   Procedure: EXPLORATION OF  ABDOMINAL WOUND ERAS PATHWAY;  Surgeon: Jimmye Norman, MD;  Location: [facility] OR;  Service: General;  Laterality: N/A;   FLEXIBLE SIGMOIDOSCOPY N/A 08/24/2017   Procedure: Arnell Sieving;  Surgeon: Jeani Hawking, MD;  Location: [facility] ENDOSCOPY;  Service: Endoscopy;  Laterality: N/A;   FLEXIBLE SIGMOIDOSCOPY N/A 08/31/2017   Procedure: FLEXIBLE SIGMOIDOSCOPY;  Surgeon: Jeani Hawking, MD;  Location: [facility] ENDOSCOPY;  Service: Endoscopy;  Laterality: N/A;  stent placement   FLEXIBLE SIGMOIDOSCOPY N/A 03/13/2020   Procedure: FLEXIBLE SIGMOIDOSCOPY;  Surgeon: Jeani Hawking, MD;  [facility] ENDOSCOPY;  Service: Endoscopy;  Laterality: N/A;   FLEXIBLE SIGMOIDOSCOPY N/A 12/18/2020   Procedure: FLEXIBLE SIGMOIDOSCOPY;  Surgeon: Jeani Hawking, MD;  [facility] ENDOSCOPY;  Service: Endoscopy;  Laterality: N/A;   HEMOSTASIS CLIP PLACEMENT  03/13/2020   Procedure: HEMOSTASIS CLIP PLACEMENT;  Surgeon: Jeani Hawking, MD;  [facility] ENDOSCOPY;  Service: Endoscopy;;   HEMOSTASIS CLIP PLACEMENT  12/18/2020   Procedure: HEMOSTASIS CLIP PLACEMENT;  Surgeon: Jeani Hawking, MD;  [facility] ENDOSCOPY;  Service: Endoscopy;;   HOT HEMOSTASIS N/A 03/13/2020   Procedure: HOT HEMOSTASIS (ARGON PLASMA COAGULATION/BICAP);  Surgeon: Jeani Hawking, MD;  Location: [facility] ENDOSCOPY;  Service: Endoscopy;  Laterality: N/A;   HOT HEMOSTASIS N/A 12/18/2020   Procedure: HOT HEMOSTASIS (ARGON PLASMA COAGULATION/BICAP);  Surgeon: Jeani Hawking, MD;  Location: [facility] ENDOSCOPY;  Service: Endoscopy;  Laterality: N/A;   IMPLANTABLE CARDIOVERTER DEFIBRILLATOR GENERATOR CHANGE  2008   INSERTION OF MESH N/A 07/09/2018   Procedure: INSERTION OF VICRYL MESH;  Surgeon: Jimmye Norman, MD;  [facility]: [facility] OR;  Service: General;  Laterality: N/A;   LAPAROTOMY N/A 09/18/2017   Procedure: EXPLORATORY LAPAROTOMY;  Surgeon: Jimmye Norman, MD;  Location: [facility] OR;   Service: General;  Laterality: N/A;   LEAD REVISION  06/22/2018   LEAD REVISION/REPAIR N/A 06/22/2018   Procedure: LEAD REVISION/REPAIR;  Surgeon: Duke Salvia, MD;  Location: [facility] INVASIVE CV LAB;  Service: Cardiovascular;  Laterality: N/A;   LYSIS OF ADHESION N/A 09/18/2017   Procedure: LYSIS OF ADHESION;  Surgeon: Jimmye Norman, MD;  Location: [facility] OR;  Service: General;  Laterality: N/A;   LYSIS OF ADHESION N/A 07/09/2018   Procedure: LYSIS OF ADHESION;  Surgeon: Jimmye Norman, MD;  Location: [facility] OR;  Service: General;  Laterality: N/A;   PARTIAL COLECTOMY N/A 09/18/2017   Procedure: ILEOCOLECTOMY;  Surgeon: Jimmye Norman, MD;  Location: [facility] OR;  Service: General;  Laterality: N/A;   PARTIAL COLECTOMY  09/25/2017   POLYPECTOMY  03/13/2020   Procedure: POLYPECTOMY;  Surgeon: Jeani Hawking, MD;  [facility] ENDOSCOPY;  Service: Endoscopy;;   RIGHT/LEFT HEART CATH AND CORONARY ANGIOGRAPHY N/A 06/01/2018   Procedure: RIGHT/LEFT HEART CATH AND CORONARY ANGIOGRAPHY;  Surgeon: Corky Crafts, MD;  Location: [facility] INVASIVE CV LAB;  Service: Cardiovascular;  Laterality: N/A;   SIGMOIDOSCOPY N/A 09/18/2017   Procedure: SIGMOIDOSCOPY;  Surgeon: Jimmye Norman, MD;  [facility] OR;  Service: General;  Laterality: N/A;   SMALL INTESTINE SURGERY  07/09/2018   EXPLORATION OF  ABDOMINAL WOUND ERAS PATHWAYN/; MESH; LYSIS OF ADHESIONS   TOTAL ABDOMINAL HYSTERECTOMY  1990   TRANSFORAMINAL LUMBAR INTERBODY FUSION (TLIF) WITH PEDICLE SCREW FIXATION 2 LEVEL N/A 01/07/2020   Procedure: Lumbar Four-Five and Lumbar Five-Sacral One open lumbar decompression and transforaminal lumbar interbody fusion;  Surgeon: Jadene Pierini, MD;  Location: [facility] OR;  Service: Neurosurgery;  Laterality: N/A;  Lumbar Four-Five and Lumbar Five-Sacral One open lumbar decompression and transforaminal lumbar interbody fusion   TRIGGER FINGER RELEASE Right 05/213   TRIGGER FINGER RELEASE Right 04/08/2019   Procedure: RIGHT INDEX RELEASE  TRIGGER FINGER/A-1 PULLEY;  Surgeon: Mack Hook, MD;  Location: [facility] OR;  Service: Orthopedics;  Laterality: Right;   TUBAL LIGATION  1980s   VESICOVAGINAL  FISTULA CLOSURE W/ TAH     Past Surgical History:  Procedure Laterality Date   APPENDECTOMY  07/13/2017   BLADDER SUSPENSION  1990   "w/hysterectomy"   BOWEL RESECTION N/A 07/09/2018   Procedure: SMALL BOWEL RESECTION;  Surgeon: Jimmye Norman, MD;  [facility] OR;  Service: General;  Laterality: N/A;   CARDIAC CATHETERIZATION  2005   no obstructive CAD per patient   CARDIAC CATHETERIZATION  2018   CARDIAC DEFIBRILLATOR PLACEMENT  2005   BiV ICD implanted,  LV lead is an epicardial lead   CARPAL TUNNEL RELEASE Left 07/25/2014   Dr.Williamson    CARPAL TUNNEL RELEASE Right 04/08/2019   Procedure: RIGHT CARPAL TUNNEL RELEASE ENDOSCOPIC;  Surgeon: Mack Hook, MD;  Location: [facility] OR;  Service: Orthopedics;  Laterality: Right;   CATARACT EXTRACTION W/ INTRAOCULAR LENS  IMPLANT, BILATERAL Bilateral    COLONIC STENT PLACEMENT N/A 08/31/2017   Procedure: COLONIC STENT PLACEMENT;  Surgeon: Jeani Hawking, MD;  Location: [facility] ENDOSCOPY;  Service: Endoscopy;  Laterality: N/A;   COLONOSCOPY     2010-2011 Dr.Kipreos    COLOSTOMY  12/2016   Hattie Perch 01/26/2017   COLOSTOMY REVERSAL N/A 07/13/2017   Procedure: COLOSTOMY REVERSAL;  Surgeon: Jimmye Norman, MD;  [facility] OR;  Service: General;  Laterality: N/A;   CYST REMOVAL HAND Right 05/2013   thumb   DILATION AND CURETTAGE OF UTERUS  X 5-6   ESOPHAGOGASTRODUODENOSCOPY (EGD) WITH PROPOFOL N/A 03/13/2020   Procedure: ESOPHAGOGASTRODUODENOSCOPY (EGD) WITH PROPOFOL;  Surgeon: Jeani Hawking, MD;  [facility] ENDOSCOPY;  Service: Endoscopy;  Laterality: N/A;   EXCISION MASS ABDOMINAL N/A 07/09/2018   Procedure: EXPLORATION OF  ABDOMINAL WOUND ERAS PATHWAY;  Surgeon: Jimmye Norman, MD;  Location: [facility] OR;  Service: General;  Laterality: N/A;   FLEXIBLE SIGMOIDOSCOPY N/A 08/24/2017   Procedure: Arnell Sieving;  Surgeon: Jeani Hawking, MD;  Location: [facility] ENDOSCOPY;  Service: Endoscopy;  Laterality: N/A;   FLEXIBLE SIGMOIDOSCOPY N/A 08/31/2017   Procedure: FLEXIBLE SIGMOIDOSCOPY;  Surgeon: Jeani Hawking, MD;  Location: [facility] ENDOSCOPY;  Service: Endoscopy;  Laterality: N/A;  stent placement   FLEXIBLE SIGMOIDOSCOPY N/A 03/13/2020   Procedure: FLEXIBLE SIGMOIDOSCOPY;  Surgeon: Jeani Hawking, MD;  [facility] ENDOSCOPY;  Service: Endoscopy;  Laterality: N/A;   FLEXIBLE SIGMOIDOSCOPY N/A 12/18/2020   Procedure: FLEXIBLE SIGMOIDOSCOPY;  Surgeon: Jeani Hawking, MD;  [facility] ENDOSCOPY;  Service: Endoscopy;  Laterality: N/A;   HEMOSTASIS CLIP PLACEMENT  03/13/2020   Procedure: HEMOSTASIS CLIP PLACEMENT;  Surgeon: Jeani Hawking, MD;  Location[facility] ENDOSCOPY;  Service: Endoscopy;;   HEMOSTASIS CLIP PLACEMENT  12/18/2020   Procedure: HEMOSTASIS CLIP PLACEMENT;  Surgeon: Jeani Hawking, MD;  [facility] ENDOSCOPY;  Service: Endoscopy;;   HOT HEMOSTASIS N/A 03/13/2020   Procedure: HOT HEMOSTASIS (ARGON PLASMA COAGULATION/BICAP);  Surgeon: Jeani Hawking, MD;  Location: [facility] ENDOSCOPY;  Service: Endoscopy;  Laterality: N/A;   HOT HEMOSTASIS N/A 12/18/2020   Procedure: HOT HEMOSTASIS (ARGON PLASMA COAGULATION/BICAP);  Surgeon: Jeani Hawking, MD;  Location: [facility] ENDOSCOPY;  Service: Endoscopy;  Laterality: N/A;   IMPLANTABLE CARDIOVERTER DEFIBRILLATOR GENERATOR CHANGE  2008   INSERTION OF MESH N/A 07/09/2018   Procedure: INSERTION OF VICRYL MESH;  Surgeon: Jimmye Norman, MD;  Location: [facility] OR;  Service: General;  Laterality: N/A;   LAPAROTOMY N/A 09/18/2017   Procedure: EXPLORATORY LAPAROTOMY;  Surgeon: Jimmye Norman, MD;  Location: [facility] OR;  Service: General;  Laterality: N/A;   LEAD REVISION  06/22/2018   LEAD REVISION/REPAIR N/A 06/22/2018   Procedure: LEAD REVISION/REPAIR;  Surgeon:  Duke Salvia, MD;  Location: [facility] INVASIVE CV LAB;  Service: Cardiovascular;  Laterality: N/A;   LYSIS OF ADHESION N/A 09/18/2017    Procedure: LYSIS OF ADHESION;  Surgeon: Jimmye Norman, MD;  Location: [facility] OR;  Service: General;  Laterality: N/A;   LYSIS OF ADHESION N/A 07/09/2018   Procedure: LYSIS OF ADHESION;  Surgeon: Jimmye Norman, MD;  Location: [facility] OR;  Service: General;  Laterality: N/A;   PARTIAL COLECTOMY N/A 09/18/2017   Procedure: ILEOCOLECTOMY;  Surgeon: Jimmye Norman, MD;  Location: [facility] OR;  Service: General;  Laterality: N/A;   PARTIAL COLECTOMY  09/25/2017   POLYPECTOMY  03/13/2020   Procedure: POLYPECTOMY;  Surgeon: Jeani Hawking, MD;  Location[facility] ENDOSCOPY;  Service: Endoscopy;;   RIGHT/LEFT HEART CATH AND CORONARY ANGIOGRAPHY N/A 06/01/2018   Procedure: RIGHT/LEFT HEART CATH AND CORONARY ANGIOGRAPHY;  Surgeon: Corky Crafts, MD;  Location: [facility] INVASIVE CV LAB;  Service: Cardiovascular;  Laterality: N/A;   SIGMOIDOSCOPY N/A 09/18/2017   Procedure: SIGMOIDOSCOPY;  Surgeon: Jimmye Norman, MD;  Location: [facility] OR;  Service: General;  Laterality: N/A;   SMALL INTESTINE SURGERY  07/09/2018   EXPLORATION OF  ABDOMINAL WOUND ERAS PATHWAYN/; MESH; LYSIS OF ADHESIONS   TOTAL ABDOMINAL HYSTERECTOMY  1990   TRANSFORAMINAL LUMBAR INTERBODY FUSION (TLIF) WITH PEDICLE SCREW FIXATION 2 LEVEL N/A 01/07/2020   Procedure: Lumbar Four-Five and Lumbar Five-Sacral One open lumbar decompression and transforaminal lumbar interbody fusion;  Surgeon: Jadene Pierini, MD;  Location: [facility] OR;  Service: Neurosurgery;  Laterality: N/A;  Lumbar Four-Five and Lumbar Five-Sacral One open lumbar decompression and transforaminal lumbar interbody fusion   TRIGGER FINGER RELEASE Right 05/213   TRIGGER FINGER RELEASE Right 04/08/2019   Procedure: RIGHT INDEX RELEASE TRIGGER FINGER/A-1 PULLEY;  Surgeon: Mack Hook, MD;  Location: [facility] OR;  Service: Orthopedics;  Laterality: Right;   TUBAL LIGATION  1980s   VESICOVAGINAL FISTULA CLOSURE W/ TAH     Past Medical History:  Diagnosis Date   AICD (automatic cardioverter/defibrillator) present     high RV threshold chronically, device was turned off in 2014; Device battery has been dead x 7 years; "turned it back on 06/22/2018"   Anemia, iron deficiency    Angioedema    felt to likely be due to ace inhibitors but says she has had this even off of medicines, appears to be tolerating ARBs chronically   Anxiety    Arthritis    "hands, legs, arms; bad in my back" (06/22/2018)   Asthmatic bronchitis with status asthmaticus    Bradycardia    CHF (congestive heart failure) (HCC)    Chronic lower back pain    Chronic pain    CKD (chronic kidney disease), stage III (HCC)    Coronary artery disease    Mild nonobstructive (30% LAD, 30% RCA) by 09/01/16 cath at [facility]   Degenerative joint disease    Depression    Diabetes mellitus, type 2 (HCC)    Diabetic peripheral neuropathy (HCC)    Dizziness    Dyspnea on exertion    Family history of adverse reaction to anesthesia    Mother has nausea   GERD (gastroesophageal reflux disease)    Gout    Heart valve disorder    History of blood transfusion 1981; ~ 2005; 12/2016   "childbirth; defibrillator OR; colostomy OR"   History of mononucleosis 03/2014   Hyperlipidemia    Hypertension    Hypothyroid    Insomnia    Intervertebral disc degeneration  Ischemic cardiomyopathy    LBBB (left bundle branch block)    Myocardial infarction [facility]) dx'd ~ 2005   Nonischemic cardiomyopathy (HCC)    s/p ICD in 2005. EF has since recovered   Obesity    On home oxygen therapy    "2L at night" (06/22/2018)   OSA on CPAP    Pneumonia    "couple times; last time was 12/2016" (06/22/2018)   PONV (postoperative nausea and vomiting)    Presence of permanent cardiac pacemaker    [employer]   Swelling    Syncope    Systemic hypertension    Vitamin D deficiency    There were no vitals taken for this visit.  Opioid Risk Score:   Fall Risk Score:  `1  Depression screen [facility] 2/9     07/10/2023    9:59 AM 06/15/2023   11:10 AM  03/29/2023   11:15 AM 02/23/2023   10:54 AM 02/23/2023   10:47 AM 01/23/2023   11:30 AM 10/26/2022   11:03 AM  Depression screen PHQ 2/9  Decreased Interest 0 0 0 0 0 0 0  Down, Depressed, Hopeless 0 0 0 0 0 0 0  PHQ - 2 Score 0 0 0 0 0 0 0    Review of Systems  Musculoskeletal:  Positive for back pain, gait problem and neck pain.       Pain in both knees, right wrist   All other systems reviewed and are negative.     Objective:   Physical Exam Vitals and nursing note reviewed.  Constitutional:      Appearance: Normal appearance. She is obese.  Neck:     Comments: Cervical Paraspinal Tenderness: C-5-C-6  Cardiovascular:     Rate and Rhythm: Normal rate and regular rhythm.     Pulses: Normal pulses.     Heart sounds: Normal heart sounds.  Pulmonary:     Effort: Pulmonary effort is normal.     Breath sounds: Normal breath sounds.  Musculoskeletal:     Comments: Normal Muscle Bulk and Muscle Testing Reveals:  Upper Extremities: Full ROM and Muscle Strength 5/5  Lumbar Paraspinal Tenderness: L-4-L-5 Lower Extremities: Full ROM and Muscle Strength 5/5 Arises From chair slowly using walker for support  Narrow Based  Gait     Skin:    General: Skin is warm and dry.  Neurological:     Mental Status: She is alert and oriented to person, place, and time.  Psychiatric:        Mood and Affect: Mood normal.        Behavior: Behavior normal.         Assessment & Plan:  Chronic Low Back Pain: Encouraged to increase HEP as Tolerated. Continue to Monitor. 08/10/2023 2. Bilateral Primary Osteoarthritis:  S/P Right Knee Genicular nerve radiofrequency neurotomy, with Dr Wynn Banker. On 08/02/2022 and S/P Left Knee Genicular Nerve Radiofrequency on 08/16/2022. With good relief noted.  : Indication Chronic post operative pain in the Knee, pain postop total knee replacement which has not responded to conservative management such as physical therapy and medication management; Continue monitor.  08/10/2023 3. Myofascial Pain: Continue HEP as Tolerated. Continue to Monitor.  08/10/2023 4. Chronic Pain Syndrome: Refilled: Oxycodone 7.5mg  /325 mg one tablet 4 times a day as needed for pain #120. Discontinue Hydrocodone due to itching,  We will continue the opioid monitoring program, this consists of regular clinic visits, examinations, urine drug screen, pill counts as well as use of [address]  Controlled Substance Reporting system. A 12 month History has been reviewed on the [place] Controlled Substance Reporting System on 08/10/2023. 5. Polyarthralgia: Continue HEP as tolerated. Continue current medication regimen. Continue to monitor. 08/10/2023  F/U in 1 month

## 2023-08-10 ENCOUNTER — Encounter: Payer: Self-pay | Admitting: Registered Nurse

## 2023-08-10 ENCOUNTER — Encounter: Payer: Medicare HMO | Attending: Registered Nurse | Admitting: Registered Nurse

## 2023-08-10 VITALS — BP 100/63 | HR 73 | Ht 64.0 in | Wt 247.0 lb

## 2023-08-10 DIAGNOSIS — I130 Hypertensive heart and chronic kidney disease with heart failure and stage 1 through stage 4 chronic kidney disease, or unspecified chronic kidney disease: Secondary | ICD-10-CM | POA: Diagnosis not present

## 2023-08-10 DIAGNOSIS — M2550 Pain in unspecified joint: Secondary | ICD-10-CM

## 2023-08-10 DIAGNOSIS — G894 Chronic pain syndrome: Secondary | ICD-10-CM

## 2023-08-10 DIAGNOSIS — G8929 Other chronic pain: Secondary | ICD-10-CM | POA: Diagnosis not present

## 2023-08-10 DIAGNOSIS — M7918 Myalgia, other site: Secondary | ICD-10-CM | POA: Diagnosis not present

## 2023-08-10 DIAGNOSIS — M170 Bilateral primary osteoarthritis of knee: Secondary | ICD-10-CM

## 2023-08-10 DIAGNOSIS — I5042 Chronic combined systolic (congestive) and diastolic (congestive) heart failure: Secondary | ICD-10-CM | POA: Diagnosis not present

## 2023-08-10 DIAGNOSIS — M545 Low back pain, unspecified: Secondary | ICD-10-CM | POA: Diagnosis not present

## 2023-08-10 DIAGNOSIS — Z5181 Encounter for therapeutic drug level monitoring: Secondary | ICD-10-CM | POA: Diagnosis not present

## 2023-08-10 DIAGNOSIS — M542 Cervicalgia: Secondary | ICD-10-CM

## 2023-08-10 DIAGNOSIS — N184 Chronic kidney disease, stage 4 (severe): Secondary | ICD-10-CM | POA: Diagnosis not present

## 2023-08-10 DIAGNOSIS — N2581 Secondary hyperparathyroidism of renal origin: Secondary | ICD-10-CM | POA: Diagnosis not present

## 2023-08-10 DIAGNOSIS — Z79891 Long term (current) use of opiate analgesic: Secondary | ICD-10-CM | POA: Diagnosis not present

## 2023-08-10 DIAGNOSIS — D509 Iron deficiency anemia, unspecified: Secondary | ICD-10-CM | POA: Diagnosis not present

## 2023-08-10 DIAGNOSIS — E1122 Type 2 diabetes mellitus with diabetic chronic kidney disease: Secondary | ICD-10-CM | POA: Diagnosis not present

## 2023-08-10 MED ORDER — OXYCODONE-ACETAMINOPHEN 7.5-325 MG PO TABS: 1.0000 | ORAL_TABLET | Freq: Four times a day (QID) | ORAL | 0 refills | Status: DC | PRN

## 2023-08-14 LAB — DRUG TOX MONITOR 1 W/CONF, ORAL FLD
Amphetamines: NEGATIVE ng/mL (ref <10)
Barbiturates: NEGATIVE ng/mL (ref <10)
Benzodiazepines: NEGATIVE ng/mL (ref <0.50)
Buprenorphine: NEGATIVE ng/mL (ref <0.10)
Cocaine: NEGATIVE ng/mL (ref <5.0)
Codeine: NEGATIVE ng/mL (ref <2.5)
Dihydrocodeine: NEGATIVE ng/mL (ref <2.5)
Fentanyl: NEGATIVE ng/mL (ref <0.10)
Heroin Metabolite: NEGATIVE ng/mL (ref <1.0)
Hydrocodone: NEGATIVE ng/mL (ref <2.5)
Hydromorphone: NEGATIVE ng/mL (ref <2.5)
MARIJUANA: NEGATIVE ng/mL (ref <2.5)
MDMA: NEGATIVE ng/mL (ref <10)
Meprobamate: NEGATIVE ng/mL (ref <2.5)
Methadone: NEGATIVE ng/mL (ref <5.0)
Morphine: NEGATIVE ng/mL (ref <2.5)
Nicotine Metabolite: NEGATIVE ng/mL (ref <5.0)
Norhydrocodone: NEGATIVE ng/mL (ref <2.5)
Noroxycodone: NEGATIVE ng/mL (ref <2.5)
Opiates: POSITIVE ng/mL — AB (ref <2.5)
Oxycodone: 33.6 ng/mL — ABNORMAL HIGH (ref <2.5)
Oxymorphone: NEGATIVE ng/mL (ref <2.5)
Phencyclidine: NEGATIVE ng/mL (ref <10)
Tapentadol: NEGATIVE ng/mL (ref <5.0)
Tramadol: NEGATIVE ng/mL (ref <5.0)
Zolpidem: NEGATIVE ng/mL (ref <5.0)

## 2023-08-14 LAB — DRUG TOX ALC METAB W/CON, ORAL FLD: Alcohol Metabolite: NEGATIVE ng/mL (ref <25)

## 2023-09-01 DIAGNOSIS — Z794 Long term (current) use of insulin: Secondary | ICD-10-CM | POA: Diagnosis not present

## 2023-09-01 DIAGNOSIS — E1129 Type 2 diabetes mellitus with other diabetic kidney complication: Secondary | ICD-10-CM | POA: Diagnosis not present

## 2023-09-01 DIAGNOSIS — N184 Chronic kidney disease, stage 4 (severe): Secondary | ICD-10-CM | POA: Diagnosis not present

## 2023-09-01 DIAGNOSIS — R051 Acute cough: Secondary | ICD-10-CM | POA: Diagnosis not present

## 2023-09-01 DIAGNOSIS — J069 Acute upper respiratory infection, unspecified: Secondary | ICD-10-CM | POA: Diagnosis not present

## 2023-09-01 DIAGNOSIS — I130 Hypertensive heart and chronic kidney disease with heart failure and stage 1 through stage 4 chronic kidney disease, or unspecified chronic kidney disease: Secondary | ICD-10-CM | POA: Diagnosis not present

## 2023-09-07 ENCOUNTER — Encounter: Payer: Medicare HMO | Admitting: Registered Nurse

## 2023-09-07 NOTE — Progress Notes (Deleted)
 Subjective:    Patient ID: Crystal [name], female    DOB: 09-22-1950, 73 y.o.   MRN: [id number]  HPI   Pain Inventory Average Pain {NUMBERS; 0-10:5044} Pain Right Now {NUMBERS; 0-10:5044} My pain is {PAIN DESCRIPTION:21022940}  In the last 24 hours, has pain interfered with the following? General activity {NUMBERS; 0-10:5044} Relation with others {NUMBERS; 0-10:5044} Enjoyment of life {NUMBERS; 0-10:5044} What TIME of day is your pain at its worst? {time of day:24191} Sleep (in general) {BHH GOOD/FAIR/POOR:22877}  Pain is worse with: {ACTIVITIES:21022942} Pain improves with: {PAIN IMPROVES XBJY:78295621} Relief from Meds: {NUMBERS; 0-10:5044}  Family History  Problem Relation Age of Onset   Hypertension Father    Heart disease Father    Cancer Father        prostate   Heart disease Other        (Maternal side) Ischemic heart disease   Diabetes Mellitus II Other    Arrhythmia Mother    Diabetes Mellitus II Mother        Borderline DM   Hypertension Mother    Asthma Mother    Cancer Brother    Dementia Brother    Hypertension Brother    Hypertension Daughter    Hypertension Daughter    Diabetes Daughter    Hypertension Daughter    Atrial fibrillation Daughter    GER disease Daughter    Hypertension Son    Anxiety disorder Son    Hypothyroidism Son    Breast cancer Maternal Grandmother        in her 26's   Social History   Socioeconomic History   Marital status: Widowed    Spouse name: Not on file   Number of children: Not on file   Years of education: Not on file   Highest education level: Not on file  Occupational History    Comment: retired LPN  Tobacco Use   Smoking status: Never   Smokeless tobacco: Never  Vaping Use   Vaping status: Never Used  Substance and Sexual Activity   Alcohol use: Not Currently   Drug use: Never   Sexual activity: Not Currently  Other Topics Concern   Not on file  Social History Narrative   Pt lives in [place]  [place] alone.  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      As of 03/15/17:   Diet: 1800 Calorie      Caffeine: Yes      Married, if yes what year: Widowed, married 1973      Do you live in a house, apartment, assisted living, condo, trailer, ect: House, 1 stories, and 1 person      Pets: No      Current/Past profession: LPN, retired       Exercise: Yes, walking          Living Will: Yes   DNR: No   POA/HPOA: Yes      Functional Status:   Do you have difficulty bathing or dressing yourself? No   Do you have difficulty preparing food or eating? No   Do you have difficulty managing your medications? No   Do you have difficulty managing your finances? No   Do you have difficulty affording your medications? Yes   Social Drivers of Corporate investment banker Strain: Low Risk  (10/11/2017)   Overall Financial Resource Strain (CARDIA)    Difficulty of Paying Living Expenses: Not hard at all  Food  Insecurity: No Food Insecurity (07/06/2023)   Hunger Vital Sign    Worried About Running Out of Food in the Last Year: Never true    Ran Out of Food in the Last Year: Never true  Transportation Needs: No Transportation Needs (07/06/2023)   PRAPARE - Administrator, Civil Service (Medical): No    Lack of Transportation (Non-Medical): No  Physical Activity: Inactive (10/11/2017)   Exercise Vital Sign    Days of Exercise per Week: 0 days    Minutes of Exercise per Session: 0 min  Stress: Stress Concern Present (10/11/2017)   [name] - Occupational Stress Questionnaire    Feeling of Stress : To some extent  Social Connections: Somewhat Isolated (10/11/2017)   Social Connection and Isolation Panel [NHANES]    Frequency of Communication with Friends and Family: More than three times a week    Frequency of Social Gatherings with Friends and Family: More than three times a week    Attends Religious  Services: More than 4 times per year    Active Member of [employer] or Organizations: No    Attends Banker Meetings: Never    Marital Status: Widowed   Past Surgical History:  Procedure Laterality Date   APPENDECTOMY  07/13/2017   BLADDER SUSPENSION  1990   "w/hysterectomy"   BOWEL RESECTION N/A 07/09/2018   Procedure: SMALL BOWEL RESECTION;  Surgeon: Jimmye Norman, MD;  [facility];  Service: General;  Laterality: N/A;   CARDIAC CATHETERIZATION  2005   no obstructive CAD per patient   CARDIAC CATHETERIZATION  2018   CARDIAC DEFIBRILLATOR PLACEMENT  2005   BiV ICD implanted,  LV lead is an epicardial lead   CARPAL TUNNEL RELEASE Left 07/25/2014   Dr.Williamson    CARPAL TUNNEL RELEASE Right 04/08/2019   Procedure: RIGHT CARPAL TUNNEL RELEASE ENDOSCOPIC;  Surgeon: Mack Hook, MD;  Location: [facility] OR;  Service: Orthopedics;  Laterality: Right;   CATARACT EXTRACTION W/ INTRAOCULAR LENS  IMPLANT, BILATERAL Bilateral    COLONIC STENT PLACEMENT N/A 08/31/2017   Procedure: COLONIC STENT PLACEMENT;  Surgeon: Jeani Hawking, MD;  Location: [facility] ENDOSCOPY;  Service: Endoscopy;  Laterality: N/A;   COLONOSCOPY     2010-2011 Dr.Kipreos    COLOSTOMY  12/2016   Hattie Perch 01/26/2017   COLOSTOMY REVERSAL N/A 07/13/2017   Procedure: COLOSTOMY REVERSAL;  Surgeon: Jimmye Norman, MD;  [facility];  Service: General;  Laterality: N/A;   CYST REMOVAL HAND Right 05/2013   thumb   DILATION AND CURETTAGE OF UTERUS  X 5-6   ESOPHAGOGASTRODUODENOSCOPY (EGD) WITH PROPOFOL N/A 03/13/2020   Procedure: ESOPHAGOGASTRODUODENOSCOPY (EGD) WITH PROPOFOL;  Surgeon: Jeani Hawking, MD;  Location: [facility] ENDOSCOPY;  Service: Endoscopy;  Laterality: N/A;   EXCISION MASS ABDOMINAL N/A 07/09/2018   Procedure: EXPLORATION OF  ABDOMINAL WOUND ERAS PATHWAY;  Surgeon: Jimmye Norman, MD;  Location: [facility] OR;  Service: General;  Laterality: N/A;   FLEXIBLE SIGMOIDOSCOPY N/A 08/24/2017   Procedure: Arnell Sieving;  Surgeon:  Jeani Hawking, MD;  Location: [facility] ENDOSCOPY;  Service: Endoscopy;  Laterality: N/A;   FLEXIBLE SIGMOIDOSCOPY N/A 08/31/2017   Procedure: FLEXIBLE SIGMOIDOSCOPY;  Surgeon: Jeani Hawking, MD;  Location: [facility] ENDOSCOPY;  Service: Endoscopy;  Laterality: N/A;  stent placement   FLEXIBLE SIGMOIDOSCOPY N/A 03/13/2020   Procedure: FLEXIBLE SIGMOIDOSCOPY;  Surgeon: Jeani Hawking, MD;  Location[facility] ENDOSCOPY;  Service: Endoscopy;  Laterality: N/A;   FLEXIBLE SIGMOIDOSCOPY N/A 12/18/2020   Procedure: FLEXIBLE SIGMOIDOSCOPY;  Surgeon: Elnoria Howard,  Luisa Hart, MD;  Location: [facility] ENDOSCOPY;  Service: Endoscopy;  Laterality: N/A;   HEMOSTASIS CLIP PLACEMENT  03/13/2020   Procedure: HEMOSTASIS CLIP PLACEMENT;  Surgeon: Jeani Hawking, MD;  Location: [facility] ENDOSCOPY;  Service: Endoscopy;;   HEMOSTASIS CLIP PLACEMENT  12/18/2020   Procedure: HEMOSTASIS CLIP PLACEMENT;  Surgeon: Jeani Hawking, MD;  Location: [facility] ENDOSCOPY;  Service: Endoscopy;;   HOT HEMOSTASIS N/A 03/13/2020   Procedure: HOT HEMOSTASIS (ARGON PLASMA COAGULATION/BICAP);  Surgeon: Jeani Hawking, MD;  Location: [facility] ENDOSCOPY;  Service: Endoscopy;  Laterality: N/A;   HOT HEMOSTASIS N/A 12/18/2020   Procedure: HOT HEMOSTASIS (ARGON PLASMA COAGULATION/BICAP);  Surgeon: Jeani Hawking, MD;  Location: [facility] ENDOSCOPY;  Service: Endoscopy;  Laterality: N/A;   IMPLANTABLE CARDIOVERTER DEFIBRILLATOR GENERATOR CHANGE  2008   INSERTION OF MESH N/A 07/09/2018   Procedure: INSERTION OF VICRYL MESH;  Surgeon: Jimmye Norman, MD;  [facility];  Service: General;  Laterality: N/A;   LAPAROTOMY N/A 09/18/2017   Procedure: EXPLORATORY LAPAROTOMY;  Surgeon: Jimmye Norman, MD;  Location: [facility] OR;  Service: General;  Laterality: N/A;   LEAD REVISION  06/22/2018   LEAD REVISION/REPAIR N/A 06/22/2018   Procedure: LEAD REVISION/REPAIR;  Surgeon: Duke Salvia, MD;  Location: [facility] INVASIVE CV LAB;  Service: Cardiovascular;  Laterality: N/A;   LYSIS OF ADHESION N/A 09/18/2017   Procedure: LYSIS OF  ADHESION;  Surgeon: Jimmye Norman, MD;  Location: [facility] OR;  Service: General;  Laterality: N/A;   LYSIS OF ADHESION N/A 07/09/2018   Procedure: LYSIS OF ADHESION;  Surgeon: Jimmye Norman, MD;  Location: [facility] OR;  Service: General;  Laterality: N/A;   PARTIAL COLECTOMY N/A 09/18/2017   Procedure: ILEOCOLECTOMY;  Surgeon: Jimmye Norman, MD;  Location: [facility] OR;  Service: General;  Laterality: N/A;   PARTIAL COLECTOMY  09/25/2017   POLYPECTOMY  03/13/2020   Procedure: POLYPECTOMY;  Surgeon: Jeani Hawking, MD;  [facility] ENDOSCOPY;  Service: Endoscopy;;   RIGHT/LEFT HEART CATH AND CORONARY ANGIOGRAPHY N/A 06/01/2018   Procedure: RIGHT/LEFT HEART CATH AND CORONARY ANGIOGRAPHY;  Surgeon: Corky Crafts, MD;  Location: [facility] INVASIVE CV LAB;  Service: Cardiovascular;  Laterality: N/A;   SIGMOIDOSCOPY N/A 09/18/2017   Procedure: SIGMOIDOSCOPY;  Surgeon: Jimmye Norman, MD;  [facility];  Service: General;  Laterality: N/A;   SMALL INTESTINE SURGERY  07/09/2018   EXPLORATION OF  ABDOMINAL WOUND ERAS PATHWAYN/; MESH; LYSIS OF ADHESIONS   TOTAL ABDOMINAL HYSTERECTOMY  1990   TRANSFORAMINAL LUMBAR INTERBODY FUSION (TLIF) WITH PEDICLE SCREW FIXATION 2 LEVEL N/A 01/07/2020   Procedure: Lumbar Four-Five and Lumbar Five-Sacral One open lumbar decompression and transforaminal lumbar interbody fusion;  Surgeon: Jadene Pierini, MD;  Location: [facility] OR;  Service: Neurosurgery;  Laterality: N/A;  Lumbar Four-Five and Lumbar Five-Sacral One open lumbar decompression and transforaminal lumbar interbody fusion   TRIGGER FINGER RELEASE Right 05/213   TRIGGER FINGER RELEASE Right 04/08/2019   Procedure: RIGHT INDEX RELEASE TRIGGER FINGER/A-1 PULLEY;  Surgeon: Mack Hook, MD;  Location: [facility] OR;  Service: Orthopedics;  Laterality: Right;   TUBAL LIGATION  1980s   VESICOVAGINAL FISTULA CLOSURE W/ TAH     Past Surgical History:  Procedure Laterality Date   APPENDECTOMY  07/13/2017   BLADDER SUSPENSION  1990    "w/hysterectomy"   BOWEL RESECTION N/A 07/09/2018   Procedure: SMALL BOWEL RESECTION;  Surgeon: Jimmye Norman, MD;  [facility];  Service: General;  Laterality: N/A;   CARDIAC CATHETERIZATION  2005   no obstructive CAD per patient   CARDIAC CATHETERIZATION  2018   CARDIAC DEFIBRILLATOR PLACEMENT  2005   BiV ICD implanted,  LV lead is an epicardial lead   CARPAL TUNNEL RELEASE Left 07/25/2014   Dr.Williamson    CARPAL TUNNEL RELEASE Right 04/08/2019   Procedure: RIGHT CARPAL TUNNEL RELEASE ENDOSCOPIC;  Surgeon: Mack Hook, MD;  Location: [facility] OR;  Service: Orthopedics;  Laterality: Right;   CATARACT EXTRACTION W/ INTRAOCULAR LENS  IMPLANT, BILATERAL Bilateral    COLONIC STENT PLACEMENT N/A 08/31/2017   Procedure: COLONIC STENT PLACEMENT;  Surgeon: Jeani Hawking, MD;  Location: [facility] ENDOSCOPY;  Service: Endoscopy;  Laterality: N/A;   COLONOSCOPY     2010-2011 Dr.Kipreos    COLOSTOMY  12/2016   Hattie Perch 01/26/2017   COLOSTOMY REVERSAL N/A 07/13/2017   Procedure: COLOSTOMY REVERSAL;  Surgeon: Jimmye Norman, MD;  [facility] OR;  Service: General;  Laterality: N/A;   CYST REMOVAL HAND Right 05/2013   thumb   DILATION AND CURETTAGE OF UTERUS  X 5-6   ESOPHAGOGASTRODUODENOSCOPY (EGD) WITH PROPOFOL N/A 03/13/2020   Procedure: ESOPHAGOGASTRODUODENOSCOPY (EGD) WITH PROPOFOL;  Surgeon: Jeani Hawking, MD;  [facility] ENDOSCOPY;  Service: Endoscopy;  Laterality: N/A;   EXCISION MASS ABDOMINAL N/A 07/09/2018   Procedure: EXPLORATION OF  ABDOMINAL WOUND ERAS PATHWAY;  Surgeon: Jimmye Norman, MD;  Location: [facility] OR;  Service: General;  Laterality: N/A;   FLEXIBLE SIGMOIDOSCOPY N/A 08/24/2017   Procedure: Arnell Sieving;  Surgeon: Jeani Hawking, MD;  Location: [facility] ENDOSCOPY;  Service: Endoscopy;  Laterality: N/A;   FLEXIBLE SIGMOIDOSCOPY N/A 08/31/2017   Procedure: FLEXIBLE SIGMOIDOSCOPY;  Surgeon: Jeani Hawking, MD;  Location: [facility] ENDOSCOPY;  Service: Endoscopy;  Laterality: N/A;  stent placement    FLEXIBLE SIGMOIDOSCOPY N/A 03/13/2020   Procedure: FLEXIBLE SIGMOIDOSCOPY;  Surgeon: Jeani Hawking, MD;  Location[facility] ENDOSCOPY;  Service: Endoscopy;  Laterality: N/A;   FLEXIBLE SIGMOIDOSCOPY N/A 12/18/2020   Procedure: FLEXIBLE SIGMOIDOSCOPY;  Surgeon: Jeani Hawking, MD;  Location[facility] ENDOSCOPY;  Service: Endoscopy;  Laterality: N/A;   HEMOSTASIS CLIP PLACEMENT  03/13/2020   Procedure: HEMOSTASIS CLIP PLACEMENT;  Surgeon: Jeani Hawking, MD;  Location: [facility] ENDOSCOPY;  Service: Endoscopy;;   HEMOSTASIS CLIP PLACEMENT  12/18/2020   Procedure: HEMOSTASIS CLIP PLACEMENT;  Surgeon: Jeani Hawking, MD;  Location[facility] ENDOSCOPY;  Service: Endoscopy;;   HOT HEMOSTASIS N/A 03/13/2020   Procedure: HOT HEMOSTASIS (ARGON PLASMA COAGULATION/BICAP);  Surgeon: Jeani Hawking, MD;  Location: [facility] ENDOSCOPY;  Service: Endoscopy;  Laterality: N/A;   HOT HEMOSTASIS N/A 12/18/2020   Procedure: HOT HEMOSTASIS (ARGON PLASMA COAGULATION/BICAP);  Surgeon: Jeani Hawking, MD;  Location: [facility] ENDOSCOPY;  Service: Endoscopy;  Laterality: N/A;   IMPLANTABLE CARDIOVERTER DEFIBRILLATOR GENERATOR CHANGE  2008   INSERTION OF MESH N/A 07/09/2018   Procedure: INSERTION OF VICRYL MESH;  Surgeon: Jimmye Norman, MD;  Location[facility] OR;  Service: General;  Laterality: N/A;   LAPAROTOMY N/A 09/18/2017   Procedure: EXPLORATORY LAPAROTOMY;  Surgeon: Jimmye Norman, MD;  Location: [facility] OR;  Service: General;  Laterality: N/A;   LEAD REVISION  06/22/2018   LEAD REVISION/REPAIR N/A 06/22/2018   Procedure: LEAD REVISION/REPAIR;  Surgeon: Duke Salvia, MD;  Location: [facility] INVASIVE CV LAB;  Service: Cardiovascular;  Laterality: N/A;   LYSIS OF ADHESION N/A 09/18/2017   Procedure: LYSIS OF ADHESION;  Surgeon: Jimmye Norman, MD;  Location: [facility] OR;  Service: General;  Laterality: N/A;   LYSIS OF ADHESION N/A 07/09/2018   Procedure: LYSIS OF ADHESION;  Surgeon: Jimmye Norman, MD;  Location: [facility] OR;  Service: General;  Laterality: N/A;  PARTIAL COLECTOMY N/A 09/18/2017    Procedure: ILEOCOLECTOMY;  Surgeon: Jimmye Norman, MD;  Location: [facility] OR;  Service: General;  Laterality: N/A;   PARTIAL COLECTOMY  09/25/2017   POLYPECTOMY  03/13/2020   Procedure: POLYPECTOMY;  Surgeon: Jeani Hawking, MD;  Location[facility] ENDOSCOPY;  Service: Endoscopy;;   RIGHT/LEFT HEART CATH AND CORONARY ANGIOGRAPHY N/A 06/01/2018   Procedure: RIGHT/LEFT HEART CATH AND CORONARY ANGIOGRAPHY;  Surgeon: Corky Crafts, MD;  Location: [facility] INVASIVE CV LAB;  Service: Cardiovascular;  Laterality: N/A;   SIGMOIDOSCOPY N/A 09/18/2017   Procedure: SIGMOIDOSCOPY;  Surgeon: Jimmye Norman, MD;  Location[facility] OR;  Service: General;  Laterality: N/A;   SMALL INTESTINE SURGERY  07/09/2018   EXPLORATION OF  ABDOMINAL WOUND ERAS PATHWAYN/; MESH; LYSIS OF ADHESIONS   TOTAL ABDOMINAL HYSTERECTOMY  1990   TRANSFORAMINAL LUMBAR INTERBODY FUSION (TLIF) WITH PEDICLE SCREW FIXATION 2 LEVEL N/A 01/07/2020   Procedure: Lumbar Four-Five and Lumbar Five-Sacral One open lumbar decompression and transforaminal lumbar interbody fusion;  Surgeon: Jadene Pierini, MD;  Location: [facility] OR;  Service: Neurosurgery;  Laterality: N/A;  Lumbar Four-Five and Lumbar Five-Sacral One open lumbar decompression and transforaminal lumbar interbody fusion   TRIGGER FINGER RELEASE Right 05/213   TRIGGER FINGER RELEASE Right 04/08/2019   Procedure: RIGHT INDEX RELEASE TRIGGER FINGER/A-1 PULLEY;  Surgeon: Mack Hook, MD;  Location: [facility] OR;  Service: Orthopedics;  Laterality: Right;   TUBAL LIGATION  1980s   VESICOVAGINAL FISTULA CLOSURE W/ TAH     Past Medical History:  Diagnosis Date   AICD (automatic cardioverter/defibrillator) present    high RV threshold chronically, device was turned off in 2014; Device battery has been dead x 7 years; "turned it back on 06/22/2018"   Anemia, iron deficiency    Angioedema    felt to likely be due to ace inhibitors but says she has had this even off of medicines, appears to be tolerating ARBs  chronically   Anxiety    Arthritis    "hands, legs, arms; bad in my back" (06/22/2018)   Asthmatic bronchitis with status asthmaticus    Bradycardia    CHF (congestive heart failure) (HCC)    Chronic lower back pain    Chronic pain    CKD (chronic kidney disease), stage III (HCC)    Coronary artery disease    Mild nonobstructive (30% LAD, 30% RCA) by 09/01/16 cath at [facility]   Degenerative joint disease    Depression    Diabetes mellitus, type 2 (HCC)    Diabetic peripheral neuropathy (HCC)    Dizziness    Dyspnea on exertion    Family history of adverse reaction to anesthesia    Mother has nausea   GERD (gastroesophageal reflux disease)    Gout    Heart valve disorder    History of blood transfusion 1981; ~ 2005; 12/2016   "childbirth; defibrillator OR; colostomy OR"   History of mononucleosis 03/2014   Hyperlipidemia    Hypertension    Hypothyroid    Insomnia    Intervertebral disc degeneration    Ischemic cardiomyopathy    LBBB (left bundle branch block)    Myocardial infarction (HCC) dx'd ~ 2005   Nonischemic cardiomyopathy (HCC)    s/p ICD in 2005. EF has since recovered   Obesity    On home oxygen therapy    "2L at night" (06/22/2018)   OSA on CPAP    Pneumonia    "couple times; last time was 12/2016" (06/22/2018)   PONV (  postoperative nausea and vomiting)    Presence of permanent cardiac pacemaker    [employer]   Swelling    Syncope    Systemic hypertension    Vitamin D deficiency    There were no vitals taken for this visit.  Opioid Risk Score:   Fall Risk Score:  `1  Depression screen [facility] 2/9     08/10/2023   10:54 AM 07/10/2023    9:59 AM 06/15/2023   11:10 AM 03/29/2023   11:15 AM 02/23/2023   10:54 AM 02/23/2023   10:47 AM 01/23/2023   11:30 AM  Depression screen [facility] 2/9  Decreased Interest 0 0 0 0 0 0 0  Down, Depressed, Hopeless 0 0 0 0 0 0 0  PHQ - 2 Score 0 0 0 0 0 0 0    Review of Systems     Objective:   Physical  Exam        Assessment & Plan:

## 2023-09-11 ENCOUNTER — Encounter: Payer: Medicare HMO | Attending: Registered Nurse | Admitting: Registered Nurse

## 2023-09-11 ENCOUNTER — Encounter: Payer: Self-pay | Admitting: Registered Nurse

## 2023-09-11 VITALS — BP 131/74 | HR 77 | Ht 64.0 in | Wt 250.0 lb

## 2023-09-11 DIAGNOSIS — Z5181 Encounter for therapeutic drug level monitoring: Secondary | ICD-10-CM | POA: Diagnosis not present

## 2023-09-11 DIAGNOSIS — M2550 Pain in unspecified joint: Secondary | ICD-10-CM | POA: Diagnosis not present

## 2023-09-11 DIAGNOSIS — M545 Low back pain, unspecified: Secondary | ICD-10-CM | POA: Insufficient documentation

## 2023-09-11 DIAGNOSIS — G894 Chronic pain syndrome: Secondary | ICD-10-CM | POA: Diagnosis not present

## 2023-09-11 DIAGNOSIS — G8929 Other chronic pain: Secondary | ICD-10-CM | POA: Insufficient documentation

## 2023-09-11 DIAGNOSIS — Z79891 Long term (current) use of opiate analgesic: Secondary | ICD-10-CM | POA: Diagnosis not present

## 2023-09-11 DIAGNOSIS — M25531 Pain in right wrist: Secondary | ICD-10-CM | POA: Insufficient documentation

## 2023-09-11 DIAGNOSIS — M170 Bilateral primary osteoarthritis of knee: Secondary | ICD-10-CM | POA: Insufficient documentation

## 2023-09-11 MED ORDER — OXYCODONE-ACETAMINOPHEN 7.5-325 MG PO TABS: 1.0000 | ORAL_TABLET | Freq: Four times a day (QID) | ORAL | 0 refills | Status: DC | PRN

## 2023-09-11 NOTE — Progress Notes (Signed)
Subjective:    Patient ID: Crystal [name], female    DOB: 09-14-1950, 73 y.o.   MRN: [id number]  HPI: Crystal [name] is a 73 y.o. female who returns for follow up appointment for chronic pain and medication refill. She states her pain is located in her right wrist, ortho following. She also reports lower back pain and bilateral knee pain. She rates her pain 6. Her current exercise regime is walking and performing stretching exercises.  Ms. Kos Morphine equivalent is 45.00 MME.   Last Oral Swab was Performed on 08/10/2023, it was consistent.      Pain Inventory Average Pain 9 Pain Right Now 6 My pain is intermittent, constant, sharp, dull, stabbing, and aching  In the last 24 hours, has pain interfered with the following? General activity 7 Relation with others 0 Enjoyment of life 8 What TIME of day is your pain at its worst? morning  and evening Sleep (in general) Good  Pain is worse with: walking, bending, standing, and some activites Pain improves with: rest, medication, and ice Relief from Meds: 9  Family History  Problem Relation Age of Onset   Hypertension Father    Heart disease Father    Cancer Father        prostate   Heart disease Other        (Maternal side) Ischemic heart disease   Diabetes Mellitus II Other    Arrhythmia Mother    Diabetes Mellitus II Mother        Borderline DM   Hypertension Mother    Asthma Mother    Cancer Brother    Dementia Brother    Hypertension Brother    Hypertension Daughter    Hypertension Daughter    Diabetes Daughter    Hypertension Daughter    Atrial fibrillation Daughter    GER disease Daughter    Hypertension Son    Anxiety disorder Son    Hypothyroidism Son    Breast cancer Maternal Grandmother        in her 37's   Social History   Socioeconomic History   Marital status: Widowed    Spouse name: Not on file   Number of children: Not on file   Years of education: Not on file   Highest education level: Not  on file  Occupational History    Comment: retired LPN  Tobacco Use   Smoking status: Never   Smokeless tobacco: Never  Vaping Use   Vaping status: Never Used  Substance and Sexual Activity   Alcohol use: Not Currently   Drug use: Never   Sexual activity: Not Currently  Other Topics Concern   Not on file  Social History Narrative   [place].  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      As of 03/15/17:   Diet: 1800 Calorie      Caffeine: Yes      Married, if yes what year: Widowed, married 1973      Do you live in a house, apartment, assisted living, [place], trailer, ect: House, 1 stories, and 1 person      Pets: No      Current/Past profession: LPN, retired       Exercise: Yes, walking          Living Will: Yes   DNR: No   POA/HPOA: Yes      Functional Status:   Do  you have difficulty bathing or dressing yourself? No   Do you have difficulty preparing food or eating? No   Do you have difficulty managing your medications? No   Do you have difficulty managing your finances? No   Do you have difficulty affording your medications? Yes   Social Drivers of Corporate investment banker Strain: Low Risk  (10/11/2017)   Overall Financial Resource Strain (CARDIA)    Difficulty of Paying Living Expenses: Not hard at all  Food Insecurity: No Food Insecurity (07/06/2023)   Hunger Vital Sign    Worried About Running Out of Food in the Last Year: Never true    Ran Out of Food in the Last Year: Never true  Transportation Needs: No Transportation Needs (07/06/2023)   PRAPARE - Administrator, Civil Service (Medical): No    Lack of Transportation (Non-Medical): No  Physical Activity: Inactive (10/11/2017)   Exercise Vital Sign    Days of Exercise per Week: 0 days    Minutes of Exercise per Session: 0 min  Stress: Stress Concern Present (10/11/2017)   [name] -  Occupational Stress Questionnaire    Feeling of Stress : To some extent  Social Connections: Somewhat Isolated (10/11/2017)   Social Connection and Isolation Panel [NHANES]    Frequency of Communication with Friends and Family: More than three times a week    Frequency of Social Gatherings with Friends and Family: More than three times a week    Attends Religious Services: More than 4 times per year    Active Member of [employer] or Organizations: No    Attends Banker Meetings: Never    Marital Status: Widowed   Past Surgical History:  Procedure Laterality Date   APPENDECTOMY  07/13/2017   BLADDER SUSPENSION  1990   "w/hysterectomy"   BOWEL RESECTION N/A 07/09/2018   Procedure: SMALL BOWEL RESECTION;  Surgeon: Jimmye Norman, MD;  [facility];  Service: General;  Laterality: N/A;   CARDIAC CATHETERIZATION  2005   no obstructive CAD per patient   CARDIAC CATHETERIZATION  2018   CARDIAC DEFIBRILLATOR PLACEMENT  2005   BiV ICD implanted,  LV lead is an epicardial lead   CARPAL TUNNEL RELEASE Left 07/25/2014   Dr.Williamson    CARPAL TUNNEL RELEASE Right 04/08/2019   Procedure: RIGHT CARPAL TUNNEL RELEASE ENDOSCOPIC;  Surgeon: Mack Hook, MD;  Location: [facility] OR;  Service: Orthopedics;  Laterality: Right;   CATARACT EXTRACTION W/ INTRAOCULAR LENS  IMPLANT, BILATERAL Bilateral    COLONIC STENT PLACEMENT N/A 08/31/2017   Procedure: COLONIC STENT PLACEMENT;  Surgeon: Jeani Hawking, MD;  Location[facility] ENDOSCOPY;  Service: Endoscopy;  Laterality: N/A;   COLONOSCOPY     2010-2011 Dr.Kipreos    COLOSTOMY  12/2016   Hattie Perch 01/26/2017   COLOSTOMY REVERSAL N/A 07/13/2017   Procedure: COLOSTOMY REVERSAL;  Surgeon: Jimmye Norman, MD;  [facility];  Service: General;  Laterality: N/A;   CYST REMOVAL HAND Right 05/2013   thumb   DILATION AND CURETTAGE OF UTERUS  X 5-6   ESOPHAGOGASTRODUODENOSCOPY (EGD) WITH PROPOFOL N/A 03/13/2020   Procedure: ESOPHAGOGASTRODUODENOSCOPY (EGD) WITH  PROPOFOL;  Surgeon: Jeani Hawking, MD;  Location: [facility] ENDOSCOPY;  Service: Endoscopy;  Laterality: N/A;   EXCISION MASS ABDOMINAL N/A 07/09/2018   Procedure: EXPLORATION OF  ABDOMINAL WOUND ERAS PATHWAY;  Surgeon: Jimmye Norman, MD;  [facility] OR;  Service: General;  Laterality: N/A;   FLEXIBLE SIGMOIDOSCOPY N/A 08/24/2017   Procedure: Wenda Low  SIGMOIDOSCOPY;  Surgeon: Jeani Hawking, MD;  Location: [facility] ENDOSCOPY;  Service: Endoscopy;  Laterality: N/A;   FLEXIBLE SIGMOIDOSCOPY N/A 08/31/2017   Procedure: FLEXIBLE SIGMOIDOSCOPY;  Surgeon: Jeani Hawking, MD;  Location: [facility] ENDOSCOPY;  Service: Endoscopy;  Laterality: N/A;  stent placement   FLEXIBLE SIGMOIDOSCOPY N/A 03/13/2020   Procedure: FLEXIBLE SIGMOIDOSCOPY;  Surgeon: Jeani Hawking, MD;  [facility] ENDOSCOPY;  Service: Endoscopy;  Laterality: N/A;   FLEXIBLE SIGMOIDOSCOPY N/A 12/18/2020   Procedure: FLEXIBLE SIGMOIDOSCOPY;  Surgeon: Jeani Hawking, MD;  [facility] ENDOSCOPY;  Service: Endoscopy;  Laterality: N/A;   HEMOSTASIS CLIP PLACEMENT  03/13/2020   Procedure: HEMOSTASIS CLIP PLACEMENT;  Surgeon: Jeani Hawking, MD;  Location[facility] ENDOSCOPY;  Service: Endoscopy;;   HEMOSTASIS CLIP PLACEMENT  12/18/2020   Procedure: HEMOSTASIS CLIP PLACEMENT;  Surgeon: Jeani Hawking, MD;  Location[facility] ENDOSCOPY;  Service: Endoscopy;;   HOT HEMOSTASIS N/A 03/13/2020   Procedure: HOT HEMOSTASIS (ARGON PLASMA COAGULATION/BICAP);  Surgeon: Jeani Hawking, MD;  Location: [facility] ENDOSCOPY;  Service: Endoscopy;  Laterality: N/A;   HOT HEMOSTASIS N/A 12/18/2020   Procedure: HOT HEMOSTASIS (ARGON PLASMA COAGULATION/BICAP);  Surgeon: Jeani Hawking, MD;  Location: [facility] ENDOSCOPY;  Service: Endoscopy;  Laterality: N/A;   IMPLANTABLE CARDIOVERTER DEFIBRILLATOR GENERATOR CHANGE  2008   INSERTION OF MESH N/A 07/09/2018   Procedure: INSERTION OF VICRYL MESH;  Surgeon: Jimmye Norman, MD;  [facility];  Service: General;  Laterality: N/A;   LAPAROTOMY N/A 09/18/2017   Procedure:  EXPLORATORY LAPAROTOMY;  Surgeon: Jimmye Norman, MD;  Location[facility] OR;  Service: General;  Laterality: N/A;   LEAD REVISION  06/22/2018   LEAD REVISION/REPAIR N/A 06/22/2018   Procedure: LEAD REVISION/REPAIR;  Surgeon: Duke Salvia, MD;  Location: [facility] INVASIVE CV LAB;  Service: Cardiovascular;  Laterality: N/A;   LYSIS OF ADHESION N/A 09/18/2017   Procedure: LYSIS OF ADHESION;  Surgeon: Jimmye Norman, MD;  Location: [facility] OR;  Service: General;  Laterality: N/A;   LYSIS OF ADHESION N/A 07/09/2018   Procedure: LYSIS OF ADHESION;  Surgeon: Jimmye Norman, MD;  Location: [facility] OR;  Service: General;  Laterality: N/A;   PARTIAL COLECTOMY N/A 09/18/2017   Procedure: ILEOCOLECTOMY;  Surgeon: Jimmye Norman, MD;  Location: [facility] OR;  Service: General;  Laterality: N/A;   PARTIAL COLECTOMY  09/25/2017   POLYPECTOMY  03/13/2020   Procedure: POLYPECTOMY;  Surgeon: Jeani Hawking, MD;  Location[facility] ENDOSCOPY;  Service: Endoscopy;;   RIGHT/LEFT HEART CATH AND CORONARY ANGIOGRAPHY N/A 06/01/2018   Procedure: RIGHT/LEFT HEART CATH AND CORONARY ANGIOGRAPHY;  Surgeon: Corky Crafts, MD;  Location: [facility] INVASIVE CV LAB;  Service: Cardiovascular;  Laterality: N/A;   SIGMOIDOSCOPY N/A 09/18/2017   Procedure: SIGMOIDOSCOPY;  Surgeon: Jimmye Norman, MD;  [facility];  Service: General;  Laterality: N/A;   SMALL INTESTINE SURGERY  07/09/2018   EXPLORATION OF  ABDOMINAL WOUND ERAS PATHWAYN/; MESH; LYSIS OF ADHESIONS   TOTAL ABDOMINAL HYSTERECTOMY  1990   TRANSFORAMINAL LUMBAR INTERBODY FUSION (TLIF) WITH PEDICLE SCREW FIXATION 2 LEVEL N/A 01/07/2020   Procedure: Lumbar Four-Five and Lumbar Five-Sacral One open lumbar decompression and transforaminal lumbar interbody fusion;  Surgeon: Jadene Pierini, MD;  Location: [facility] OR;  Service: Neurosurgery;  Laterality: N/A;  Lumbar Four-Five and Lumbar Five-Sacral One open lumbar decompression and transforaminal lumbar interbody fusion   TRIGGER FINGER RELEASE Right 05/213   TRIGGER  FINGER RELEASE Right 04/08/2019   Procedure: RIGHT INDEX RELEASE TRIGGER FINGER/A-1 PULLEY;  Surgeon: Mack Hook, MD;  Location: [facility] OR;  Service: Orthopedics;  Laterality: Right;   TUBAL LIGATION  1980s   VESICOVAGINAL FISTULA CLOSURE W/ TAH     Past Surgical History:  Procedure Laterality Date   APPENDECTOMY  07/13/2017   BLADDER SUSPENSION  1990   "w/hysterectomy"   BOWEL RESECTION N/A 07/09/2018   Procedure: SMALL BOWEL RESECTION;  Surgeon: Jimmye Norman, MD;  [facility];  Service: General;  Laterality: N/A;   CARDIAC CATHETERIZATION  2005   no obstructive CAD per patient   CARDIAC CATHETERIZATION  2018   CARDIAC DEFIBRILLATOR PLACEMENT  2005   BiV ICD implanted,  LV lead is an epicardial lead   CARPAL TUNNEL RELEASE Left 07/25/2014   Dr.Williamson    CARPAL TUNNEL RELEASE Right 04/08/2019   Procedure: RIGHT CARPAL TUNNEL RELEASE ENDOSCOPIC;  Surgeon: Mack Hook, MD;  Location: [facility] OR;  Service: Orthopedics;  Laterality: Right;   CATARACT EXTRACTION W/ INTRAOCULAR LENS  IMPLANT, BILATERAL Bilateral    COLONIC STENT PLACEMENT N/A 08/31/2017   Procedure: COLONIC STENT PLACEMENT;  Surgeon: Jeani Hawking, MD;  Location: [facility] ENDOSCOPY;  Service: Endoscopy;  Laterality: N/A;   COLONOSCOPY     2010-2011 Dr.Kipreos    COLOSTOMY  12/2016   Hattie Perch 01/26/2017   COLOSTOMY REVERSAL N/A 07/13/2017   Procedure: COLOSTOMY REVERSAL;  Surgeon: Jimmye Norman, MD;  [facility] OR;  Service: General;  Laterality: N/A;   CYST REMOVAL HAND Right 05/2013   thumb   DILATION AND CURETTAGE OF UTERUS  X 5-6   ESOPHAGOGASTRODUODENOSCOPY (EGD) WITH PROPOFOL N/A 03/13/2020   Procedure: ESOPHAGOGASTRODUODENOSCOPY (EGD) WITH PROPOFOL;  Surgeon: Jeani Hawking, MD;  [facility] ENDOSCOPY;  Service: Endoscopy;  Laterality: N/A;   EXCISION MASS ABDOMINAL N/A 07/09/2018   Procedure: EXPLORATION OF  ABDOMINAL WOUND ERAS PATHWAY;  Surgeon: Jimmye Norman, MD;  Location: [facility] OR;  Service: General;  Laterality: N/A;    FLEXIBLE SIGMOIDOSCOPY N/A 08/24/2017   Procedure: Arnell Sieving;  Surgeon: Jeani Hawking, MD;  Location: [facility] ENDOSCOPY;  Service: Endoscopy;  Laterality: N/A;   FLEXIBLE SIGMOIDOSCOPY N/A 08/31/2017   Procedure: FLEXIBLE SIGMOIDOSCOPY;  Surgeon: Jeani Hawking, MD;  Location: [facility] ENDOSCOPY;  Service: Endoscopy;  Laterality: N/A;  stent placement   FLEXIBLE SIGMOIDOSCOPY N/A 03/13/2020   Procedure: FLEXIBLE SIGMOIDOSCOPY;  Surgeon: Jeani Hawking, MD;  Location[facility] ENDOSCOPY;  Service: Endoscopy;  Laterality: N/A;   FLEXIBLE SIGMOIDOSCOPY N/A 12/18/2020   Procedure: FLEXIBLE SIGMOIDOSCOPY;  Surgeon: Jeani Hawking, MD;  Location[facility] ENDOSCOPY;  Service: Endoscopy;  Laterality: N/A;   HEMOSTASIS CLIP PLACEMENT  03/13/2020   Procedure: HEMOSTASIS CLIP PLACEMENT;  Surgeon: Jeani Hawking, MD;  Location: [facility] ENDOSCOPY;  Service: Endoscopy;;   HEMOSTASIS CLIP PLACEMENT  12/18/2020   Procedure: HEMOSTASIS CLIP PLACEMENT;  Surgeon: Jeani Hawking, MD;  Location: [facility] ENDOSCOPY;  Service: Endoscopy;;   HOT HEMOSTASIS N/A 03/13/2020   Procedure: HOT HEMOSTASIS (ARGON PLASMA COAGULATION/BICAP);  Surgeon: Jeani Hawking, MD;  Location: [facility] ENDOSCOPY;  Service: Endoscopy;  Laterality: N/A;   HOT HEMOSTASIS N/A 12/18/2020   Procedure: HOT HEMOSTASIS (ARGON PLASMA COAGULATION/BICAP);  Surgeon: Jeani Hawking, MD;  Location: [facility] ENDOSCOPY;  Service: Endoscopy;  Laterality: N/A;   IMPLANTABLE CARDIOVERTER DEFIBRILLATOR GENERATOR CHANGE  2008   INSERTION OF MESH N/A 07/09/2018   Procedure: INSERTION OF VICRYL MESH;  Surgeon: Jimmye Norman, MD;  Location: [facility] OR;  Service: General;  Laterality: N/A;   LAPAROTOMY N/A 09/18/2017   Procedure: EXPLORATORY LAPAROTOMY;  Surgeon: Jimmye Norman, MD;  Location: [facility] OR;  Service: General;  Laterality: N/A;   LEAD REVISION  06/22/2018  LEAD REVISION/REPAIR N/A 06/22/2018   Procedure: LEAD REVISION/REPAIR;  Surgeon: Duke Salvia, MD;  Location: [facility] INVASIVE CV LAB;  Service:  Cardiovascular;  Laterality: N/A;   LYSIS OF ADHESION N/A 09/18/2017   Procedure: LYSIS OF ADHESION;  Surgeon: Jimmye Norman, MD;  Location: [facility] OR;  Service: General;  Laterality: N/A;   LYSIS OF ADHESION N/A 07/09/2018   Procedure: LYSIS OF ADHESION;  Surgeon: Jimmye Norman, MD;  Location: [facility] OR;  Service: General;  Laterality: N/A;   PARTIAL COLECTOMY N/A 09/18/2017   Procedure: ILEOCOLECTOMY;  Surgeon: Jimmye Norman, MD;  Location: [facility] OR;  Service: General;  Laterality: N/A;   PARTIAL COLECTOMY  09/25/2017   POLYPECTOMY  03/13/2020   Procedure: POLYPECTOMY;  Surgeon: Jeani Hawking, MD;  Location: [facility] ENDOSCOPY;  Service: Endoscopy;;   RIGHT/LEFT HEART CATH AND CORONARY ANGIOGRAPHY N/A 06/01/2018   Procedure: RIGHT/LEFT HEART CATH AND CORONARY ANGIOGRAPHY;  Surgeon: Corky Crafts, MD;  Location: [facility] INVASIVE CV LAB;  Service: Cardiovascular;  Laterality: N/A;   SIGMOIDOSCOPY N/A 09/18/2017   Procedure: SIGMOIDOSCOPY;  Surgeon: Jimmye Norman, MD;  Location[facility] OR;  Service: General;  Laterality: N/A;   SMALL INTESTINE SURGERY  07/09/2018   EXPLORATION OF  ABDOMINAL WOUND ERAS PATHWAYN/; MESH; LYSIS OF ADHESIONS   TOTAL ABDOMINAL HYSTERECTOMY  1990   TRANSFORAMINAL LUMBAR INTERBODY FUSION (TLIF) WITH PEDICLE SCREW FIXATION 2 LEVEL N/A 01/07/2020   Procedure: Lumbar Four-Five and Lumbar Five-Sacral One open lumbar decompression and transforaminal lumbar interbody fusion;  Surgeon: Jadene Pierini, MD;  Location: [facility] OR;  Service: Neurosurgery;  Laterality: N/A;  Lumbar Four-Five and Lumbar Five-Sacral One open lumbar decompression and transforaminal lumbar interbody fusion   TRIGGER FINGER RELEASE Right 05/213   TRIGGER FINGER RELEASE Right 04/08/2019   Procedure: RIGHT INDEX RELEASE TRIGGER FINGER/A-1 PULLEY;  Surgeon: Mack Hook, MD;  Location: [facility] OR;  Service: Orthopedics;  Laterality: Right;   TUBAL LIGATION  1980s   VESICOVAGINAL FISTULA CLOSURE W/ TAH     Past Medical History:   Diagnosis Date   AICD (automatic cardioverter/defibrillator) present    high RV threshold chronically, device was turned off in 2014; Device battery has been dead x 7 years; "turned it back on 06/22/2018"   Anemia, iron deficiency    Angioedema    felt to likely be due to ace inhibitors but says she has had this even off of medicines, appears to be tolerating ARBs chronically   Anxiety    Arthritis    "hands, legs, arms; bad in my back" (06/22/2018)   Asthmatic bronchitis with status asthmaticus    Bradycardia    CHF (congestive heart failure) (HCC)    Chronic lower back pain    Chronic pain    CKD (chronic kidney disease), stage III (HCC)    Coronary artery disease    Mild nonobstructive (30% LAD, 30% RCA) by 09/01/16 cath at [facility]   Degenerative joint disease    Depression    Diabetes mellitus, type 2 (HCC)    Diabetic peripheral neuropathy (HCC)    Dizziness    Dyspnea on exertion    Family history of adverse reaction to anesthesia    Mother has nausea   GERD (gastroesophageal reflux disease)    Gout    Heart valve disorder    History of blood transfusion 1981; ~ 2005; 12/2016   "childbirth; defibrillator OR; colostomy OR"   History of mononucleosis 03/2014   Hyperlipidemia    Hypertension    Hypothyroid  Insomnia    Intervertebral disc degeneration    Ischemic cardiomyopathy    LBBB (left bundle branch block)    Myocardial infarction (HCC) dx'd ~ 2005   Nonischemic cardiomyopathy (HCC)    s/p ICD in 2005. EF has since recovered   Obesity    On home oxygen therapy    "2L at night" (06/22/2018)   OSA on CPAP    Pneumonia    "couple times; last time was 12/2016" (06/22/2018)   PONV (postoperative nausea and vomiting)    Presence of permanent cardiac pacemaker    [employer]   Swelling    Syncope    Systemic hypertension    Vitamin D deficiency    There were no vitals taken for this visit.  Opioid Risk Score:   Fall Risk Score:   `1  Depression screen [facility] 2/9     08/10/2023   10:54 AM 07/10/2023    9:59 AM 06/15/2023   11:10 AM 03/29/2023   11:15 AM 02/23/2023   10:54 AM 02/23/2023   10:47 AM 01/23/2023   11:30 AM  Depression screen PHQ 2/9  Decreased Interest 0 0 0 0 0 0 0  Down, Depressed, Hopeless 0 0 0 0 0 0 0  PHQ - 2 Score 0 0 0 0 0 0 0    Review of Systems  Musculoskeletal:  Positive for back pain and gait problem.       Right wrist pain, pain in both knees      Objective:   Physical Exam Vitals and nursing note reviewed.  Constitutional:      Appearance: Normal appearance.  Cardiovascular:     Rate and Rhythm: Normal rate and regular rhythm.     Pulses: Normal pulses.     Heart sounds: Normal heart sounds.  Pulmonary:     Effort: Pulmonary effort is normal.     Breath sounds: Normal breath sounds.  Musculoskeletal:     Comments:   Normal Muscle Bulk and Muscle Testing Reveals:  Upper Extremities: Full ROM and Muscle Strength 5/5  Lumbar Paraspinal Tenderness: L-4-L-5 Lower Extremities: Full ROM and Muscle Strength 5/5 Bilateral Lower Extremities Flexion Produces Pain into her Bilateral Patella's  Arises from Chair slowly using walker for support Narrow Based  Gait     Skin:    General: Skin is warm and dry.  Neurological:     Mental Status: She is alert and oriented to person, place, and time.  Psychiatric:        Mood and Affect: Mood normal.        Behavior: Behavior normal.         Assessment & Plan:  Chronic Low Back Pain: Encouraged to increase HEP as Tolerated. Continue to Monitor. 09/11/2023 2. Bilateral Primary Osteoarthritis:  S/P Right Knee Genicular nerve radiofrequency neurotomy, with Dr Wynn Banker. On 08/02/2022 and S/P Left Knee Genicular Nerve Radiofrequency on 08/16/2022. With good relief noted.  : Indication Chronic post operative pain in the Knee, pain postop total knee replacement which has not responded to conservative management such as physical therapy and  medication management; Continue monitor. 09/11/2023 3. Myofascial Pain: Continue HEP as Tolerated. Continue to Monitor.  09/11/2023 4. Chronic Pain Syndrome: Refilled: Oxycodone 7.5mg  /325 mg one tablet 4 times a day as needed for pain #120. Discontinue Hydrocodone due to itching,  We will continue the opioid monitoring program, this consists of regular clinic visits, examinations, urine drug screen, pill counts as well as use of [address] Controlled  Substance Reporting system. A 12 month History has been reviewed on the [place] Controlled Substance Reporting System on 09/11/2023. 5. Polyarthralgia: Continue HEP as tolerated. Continue current medication regimen. Continue to monitor. 09/11/2023   F/U in 1 month

## 2023-09-12 ENCOUNTER — Other Ambulatory Visit: Payer: Self-pay | Admitting: Student-PharmD

## 2023-09-12 NOTE — Progress Notes (Signed)
Spoke to patient today regarding her patient assistance application renewal through [employer] for [place] R U-[id number]. Patient portion of application mailed to patient today, she is aware to return application to office and I will send in to [facility] once completed.  Jeanella Craze, PharmD Clinical Pharmacist [facility] [phone]

## 2023-09-21 ENCOUNTER — Ambulatory Visit: Payer: Self-pay

## 2023-09-21 NOTE — Patient Outreach (Signed)
 Care Coordination   Follow Up Visit Note   09/21/2023 Name: Crystal [name] MRN: [id number] DOB: Sep 23, 1950  Crystal [name] is a 73 y.o. year old female who sees Rodrigo Ran, MD for primary care. I spoke with  Crystal [name] by phone today.  What matters to the patients health and wellness today?  Patient would like to maintain and or improve her kidney function.     Goals Addressed               This Visit's Progress     Patient Stated     COMPLETED: I would like to continue to improve my diabetes (pt-stated)        Care Coordination Interventions: Discussed with patient, nurse case closure per PCP provider request Instructed patient to contact her PCP to report new symptoms or concerns       Other     COMPLETED: continue to get good pain control        Care Coordination Interventions: Discussed with patient, nurse case closure per PCP provider request Instructed patient to contact her PCP to report new symptoms or concerns       COMPLETED: To better manage swelling and shortness of breath        Care Coordination Interventions: Discussed with patient, nurse case closure per PCP provider request Instructed patient to contact her PCP to report new symptoms or concerns       COMPLETED: To improve shortness of breath related to Asthma        Care Coordination Interventions: Discussed with patient, nurse case closure per PCP provider request Instructed patient to contact her PCP to report new symptoms or concerns       COMPLETED: To maintain or improve current kidney function        Care Coordination Interventions: Evaluation of current treatment plan related to chronic kidney disease self management and patient's adherence to plan as established by provider      Provided education on kidney disease progression    Reinforced to patient increasing her daily water intake to 48-64 oz daily unless otherwise directed by her doctor  Discussed with patient, nurse case closure per  PCP provider request Instructed patient to contact her PCP and or kidney specialist to report new symptoms or concerns  Last practice recorded BP readings:  BP Readings from Last 3 Encounters:  07/04/23 122/62  07/04/23 122/62  06/15/23 122/69   Most recent eGFR/CrCl:  Lab Results  Component Value Date   EGFR 27.0 01/24/2023    No components found for: "CRCL"     Interventions Today    Flowsheet Row Most Recent Value  Chronic Disease   Chronic disease during today's visit Chronic Kidney Disease/End Stage Renal Disease (ESRD)  General Interventions   General Interventions Discussed/Reviewed General Interventions Discussed, General Interventions Reviewed, Doctor Visits, Labs  Doctor Visits Discussed/Reviewed Doctor Visits Discussed, Doctor Visits Reviewed, Specialist  Education Interventions   Education Provided Provided Education  Provided Verbal Education On Nutrition, When to see the doctor, Labs, Medication  Labs Reviewed Kidney Function  Nutrition Interventions   Nutrition Discussed/Reviewed Nutrition Discussed, Nutrition Reviewed, Fluid intake  Pharmacy Interventions   Pharmacy Dicussed/Reviewed Pharmacy Topics Discussed, Pharmacy Topics Reviewed          SDOH assessments and interventions completed:  No     Care Coordination Interventions:  Yes, provided   Follow up plan: No further intervention required.   Encounter Outcome:  Patient Visit Completed

## 2023-09-21 NOTE — Patient Instructions (Signed)
 Visit Information  Thank you for taking time to visit with me today. Please don't hesitate to contact me if I can be of assistance to you.   Following are the goals we discussed today:   Goals Addressed               This Visit's Progress     Patient Stated     COMPLETED: I would like to continue to improve my diabetes (pt-stated)        Care Coordination Interventions: Discussed with patient, nurse case closure per PCP provider request Instructed patient to contact her PCP to report new symptoms or concerns       Other     COMPLETED: continue to get good pain control        Care Coordination Interventions: Discussed with patient, nurse case closure per PCP provider request Instructed patient to contact her PCP to report new symptoms or concerns       COMPLETED: To better manage swelling and shortness of breath        Care Coordination Interventions: Discussed with patient, nurse case closure per PCP provider request Instructed patient to contact her PCP to report new symptoms or concerns       COMPLETED: To improve shortness of breath related to Asthma        Care Coordination Interventions: Discussed with patient, nurse case closure per PCP provider request Instructed patient to contact her PCP to report new symptoms or concerns       COMPLETED: To maintain or improve current kidney function        Care Coordination Interventions: Evaluation of current treatment plan related to chronic kidney disease self management and patient's adherence to plan as established by provider      Provided education on kidney disease progression    Reinforced to patient increasing her daily water intake to 48-64 oz daily unless otherwise directed by her doctor  Discussed with patient, nurse case closure per PCP provider request Instructed patient to contact her PCP and or kidney specialist to report new symptoms or concerns  Last practice recorded BP readings:  BP Readings from Last 3  Encounters:  07/04/23 122/62  07/04/23 122/62  06/15/23 122/69   Most recent eGFR/CrCl:  Lab Results  Component Value Date   EGFR 27.0 01/24/2023    No components found for: "CRCL"        If you are experiencing a Mental Health or Behavioral Health Crisis or need someone to talk to, please call 1-800-273-TALK (toll free, 24 hour hotline)  Patient verbalizes understanding of instructions and care plan provided today and agrees to view in [place]. Active MyChart status and patient understanding of how to access instructions and care plan via MyChart confirmed with patient.     Delsa Sale RN BSN CCM Outlook  [facility], [facility] Nurse Care Coordinator  Direct Dial: [phone] Website: [email or web address]

## 2023-09-22 ENCOUNTER — Ambulatory Visit (INDEPENDENT_AMBULATORY_CARE_PROVIDER_SITE_OTHER): Payer: Medicare Other

## 2023-09-22 DIAGNOSIS — I428 Other cardiomyopathies: Secondary | ICD-10-CM | POA: Diagnosis not present

## 2023-09-25 ENCOUNTER — Telehealth: Payer: Self-pay

## 2023-09-25 LAB — CUP PACEART REMOTE DEVICE CHECK
Battery Remaining Longevity: 42 mo
Battery Remaining Percentage: 45 %
Brady Statistic RA Percent Paced: 0 %
Brady Statistic RV Percent Paced: 98 %
Date Time Interrogation Session: 20250228020100
HighPow Impedance: 69 Ohm
Lead Channel Impedance Value: 485 Ohm
Lead Channel Impedance Value: 536 Ohm
Lead Channel Impedance Value: 858 Ohm
Lead Channel Pacing Threshold Amplitude: 0.8 V
Lead Channel Pacing Threshold Pulse Width: 0.4 ms
Lead Channel Setting Pacing Amplitude: 2 V
Lead Channel Setting Pacing Amplitude: 2.5 V
Lead Channel Setting Pacing Amplitude: 4 V
Lead Channel Setting Pacing Pulse Width: 0.4 ms
Lead Channel Setting Pacing Pulse Width: 1 ms
Lead Channel Setting Sensing Sensitivity: 0.5 mV
Lead Channel Setting Sensing Sensitivity: 1 mV
Pulse Gen Serial Number: 227627

## 2023-09-25 NOTE — Telephone Encounter (Signed)
 Alert received from [employer] for 2hrs in duration, mean V rate 133, EGM c/w AFL.  No hx of PAF or OAC per EPIC.  Attempted to contact patient to assess for symptoms. No answer, LMTCB.

## 2023-09-26 NOTE — Telephone Encounter (Signed)
 Spoke with Pt.  She does not recall any symptoms at the time of this event.  Pt has a history of SCAF.  Advised would forward to Dr. Graciela Husbands for review and return call if any changes recommended.  Pt indicates understanding.

## 2023-10-03 ENCOUNTER — Encounter: Payer: Medicare HMO | Attending: Registered Nurse | Admitting: Registered Nurse

## 2023-10-03 VITALS — BP 89/44 | HR 73 | Ht 64.0 in | Wt 250.0 lb

## 2023-10-03 DIAGNOSIS — M545 Low back pain, unspecified: Secondary | ICD-10-CM | POA: Diagnosis not present

## 2023-10-03 DIAGNOSIS — Z5181 Encounter for therapeutic drug level monitoring: Secondary | ICD-10-CM | POA: Insufficient documentation

## 2023-10-03 DIAGNOSIS — G894 Chronic pain syndrome: Secondary | ICD-10-CM | POA: Insufficient documentation

## 2023-10-03 DIAGNOSIS — M2550 Pain in unspecified joint: Secondary | ICD-10-CM | POA: Insufficient documentation

## 2023-10-03 DIAGNOSIS — M170 Bilateral primary osteoarthritis of knee: Secondary | ICD-10-CM | POA: Diagnosis not present

## 2023-10-03 DIAGNOSIS — M542 Cervicalgia: Secondary | ICD-10-CM | POA: Insufficient documentation

## 2023-10-03 DIAGNOSIS — M25531 Pain in right wrist: Secondary | ICD-10-CM | POA: Diagnosis not present

## 2023-10-03 DIAGNOSIS — Z79891 Long term (current) use of opiate analgesic: Secondary | ICD-10-CM | POA: Insufficient documentation

## 2023-10-03 DIAGNOSIS — G8929 Other chronic pain: Secondary | ICD-10-CM | POA: Insufficient documentation

## 2023-10-03 DIAGNOSIS — M7918 Myalgia, other site: Secondary | ICD-10-CM | POA: Insufficient documentation

## 2023-10-03 DIAGNOSIS — M546 Pain in thoracic spine: Secondary | ICD-10-CM | POA: Diagnosis present

## 2023-10-03 MED ORDER — OXYCODONE-ACETAMINOPHEN 7.5-325 MG PO TABS: 1.0000 | ORAL_TABLET | Freq: Four times a day (QID) | ORAL | 0 refills | Status: DC | PRN

## 2023-10-03 NOTE — Progress Notes (Signed)
 Subjective:    Patient ID: Crystal [name], female    DOB: 11-28-1950, 73 y.o.   MRN: [id number]  HPI: Crystal [name] is a 72 y.o. female who returns for follow up appointment for chronic pain and medication refill. She states her  pain is located in her right wrist, neck, lower back  and bilateral knee pain. She rates her pain 6. Her current exercise regime is walking and performing stretching exercises.  Ms. Karim Morphine equivalent is 45.00 MME.   Last Oral Swab was Performed on 08/10/2023, it was consistent.     Pain Inventory Average Pain 10 Pain Right Now 6 My pain is sharp, burning, stabbing, and aching  In the last 24 hours, has pain interfered with the following? General activity 0 Relation with others 0 Enjoyment of life 7 What TIME of day is your pain at its worst? morning  and evening Sleep (in general) Good  Pain is worse with: walking, bending, standing, and some activites Pain improves with: rest, heat/ice, and medication Relief from Meds: 8  Family History  Problem Relation Age of Onset   Hypertension Father    Heart disease Father    Cancer Father        prostate   Heart disease Other        (Maternal side) Ischemic heart disease   Diabetes Mellitus II Other    Arrhythmia Mother    Diabetes Mellitus II Mother        Borderline DM   Hypertension Mother    Asthma Mother    Cancer Brother    Dementia Brother    Hypertension Brother    Hypertension Daughter    Hypertension Daughter    Diabetes Daughter    Hypertension Daughter    Atrial fibrillation Daughter    GER disease Daughter    Hypertension Son    Anxiety disorder Son    Hypothyroidism Son    Breast cancer Maternal Grandmother        in her 71's   Social History   Socioeconomic History   Marital status: Widowed    Spouse name: Not on file   Number of children: Not on file   Years of education: Not on file   Highest education level: Not on file  Occupational History    Comment:  retired LPN  Tobacco Use   Smoking status: Never   Smokeless tobacco: Never  Vaping Use   Vaping status: Never Used  Substance and Sexual Activity   Alcohol use: Not Currently   Drug use: Never   Sexual activity: Not Currently  Other Topics Concern   Not on file  Social History Narrative   [place].  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      As of 03/15/17:   Diet: 1800 Calorie      Caffeine: Yes      Married, if yes what year: Widowed, married 1973      Do you live in a house, apartment, assisted living, condo, trailer, ect: House, 1 stories, and 1 person      Pets: No      Current/Past profession: LPN, retired       Exercise: Yes, walking          Living Will: Yes   DNR: No   POA/HPOA: Yes      Functional Status:   Do you have difficulty bathing or dressing  yourself? No   Do you have difficulty preparing food or eating? No   Do you have difficulty managing your medications? No   Do you have difficulty managing your finances? No   Do you have difficulty affording your medications? Yes   Social Drivers of Corporate investment banker Strain: Low Risk  (10/11/2017)   Overall Financial Resource Strain (CARDIA)    Difficulty of Paying Living Expenses: Not hard at all  Food Insecurity: No Food Insecurity (07/06/2023)   Hunger Vital Sign    Worried About Running Out of Food in the Last Year: Never true    Ran Out of Food in the Last Year: Never true  Transportation Needs: No Transportation Needs (07/06/2023)   PRAPARE - Administrator, Civil Service (Medical): No    Lack of Transportation (Non-Medical): No  Physical Activity: Inactive (10/11/2017)   Exercise Vital Sign    Days of Exercise per Week: 0 days    Minutes of Exercise per Session: 0 min  Stress: Stress Concern Present (10/11/2017)   [name] - Occupational Stress Questionnaire    Feeling of  Stress : To some extent  Social Connections: Somewhat Isolated (10/11/2017)   Social Connection and Isolation Panel [NHANES]    Frequency of Communication with Friends and Family: More than three times a week    Frequency of Social Gatherings with Friends and Family: More than three times a week    Attends Religious Services: More than 4 times per year    Active Member of [employer] or Organizations: No    Attends Banker Meetings: Never    Marital Status: Widowed   Past Surgical History:  Procedure Laterality Date   APPENDECTOMY  07/13/2017   BLADDER SUSPENSION  1990   "w/hysterectomy"   BOWEL RESECTION N/A 07/09/2018   Procedure: SMALL BOWEL RESECTION;  Surgeon: Jimmye Norman, MD;  [facility]: [facility];  Service: General;  Laterality: N/A;   CARDIAC CATHETERIZATION  2005   no obstructive CAD per patient   CARDIAC CATHETERIZATION  2018   CARDIAC DEFIBRILLATOR PLACEMENT  2005   BiV ICD implanted,  LV lead is an epicardial lead   CARPAL TUNNEL RELEASE Left 07/25/2014   Dr.Williamson    CARPAL TUNNEL RELEASE Right 04/08/2019   Procedure: RIGHT CARPAL TUNNEL RELEASE ENDOSCOPIC;  Surgeon: Mack Hook, MD;  Location: [facility] OR;  Service: Orthopedics;  Laterality: Right;   CATARACT EXTRACTION W/ INTRAOCULAR LENS  IMPLANT, BILATERAL Bilateral    COLONIC STENT PLACEMENT N/A 08/31/2017   Procedure: COLONIC STENT PLACEMENT;  Surgeon: Jeani Hawking, MD;  Location: [facility] ENDOSCOPY;  Service: Endoscopy;  Laterality: N/A;   COLONOSCOPY     2010-2011 Dr.Kipreos    COLOSTOMY  12/2016   Hattie Perch 01/26/2017   COLOSTOMY REVERSAL N/A 07/13/2017   Procedure: COLOSTOMY REVERSAL;  Surgeon: Jimmye Norman, MD;  [facility];  Service: General;  Laterality: N/A;   CYST REMOVAL HAND Right 05/2013   thumb   DILATION AND CURETTAGE OF UTERUS  X 5-6   ESOPHAGOGASTRODUODENOSCOPY (EGD) WITH PROPOFOL N/A 03/13/2020   Procedure: ESOPHAGOGASTRODUODENOSCOPY (EGD) WITH PROPOFOL;  Surgeon: Jeani Hawking, MD;  [facility]: [facility]  ENDOSCOPY;  Service: Endoscopy;  Laterality: N/A;   EXCISION MASS ABDOMINAL N/A 07/09/2018   Procedure: EXPLORATION OF  ABDOMINAL WOUND ERAS PATHWAY;  Surgeon: Jimmye Norman, MD;  Location: [facility] OR;  Service: General;  Laterality: N/A;   FLEXIBLE SIGMOIDOSCOPY N/A 08/24/2017   Procedure: Arnell Sieving;  Surgeon: Jeani Hawking, MD;  [facility] ENDOSCOPY;  Service: Endoscopy;  Laterality: N/A;   FLEXIBLE SIGMOIDOSCOPY N/A 08/31/2017   Procedure: FLEXIBLE SIGMOIDOSCOPY;  Surgeon: Jeani Hawking, MD;  Location: [facility] ENDOSCOPY;  Service: Endoscopy;  Laterality: N/A;  stent placement   FLEXIBLE SIGMOIDOSCOPY N/A 03/13/2020   Procedure: FLEXIBLE SIGMOIDOSCOPY;  Surgeon: Jeani Hawking, MD;  [facility] ENDOSCOPY;  Service: Endoscopy;  Laterality: N/A;   FLEXIBLE SIGMOIDOSCOPY N/A 12/18/2020   Procedure: FLEXIBLE SIGMOIDOSCOPY;  Surgeon: Jeani Hawking, MD;  [facility] ENDOSCOPY;  Service: Endoscopy;  Laterality: N/A;   HEMOSTASIS CLIP PLACEMENT  03/13/2020   Procedure: HEMOSTASIS CLIP PLACEMENT;  Surgeon: Jeani Hawking, MD;  [facility] ENDOSCOPY;  Service: Endoscopy;;   HEMOSTASIS CLIP PLACEMENT  12/18/2020   Procedure: HEMOSTASIS CLIP PLACEMENT;  Surgeon: Jeani Hawking, MD;  [facility] ENDOSCOPY;  Service: Endoscopy;;   HOT HEMOSTASIS N/A 03/13/2020   Procedure: HOT HEMOSTASIS (ARGON PLASMA COAGULATION/BICAP);  Surgeon: Jeani Hawking, MD;  Location: [facility] ENDOSCOPY;  Service: Endoscopy;  Laterality: N/A;   HOT HEMOSTASIS N/A 12/18/2020   Procedure: HOT HEMOSTASIS (ARGON PLASMA COAGULATION/BICAP);  Surgeon: Jeani Hawking, MD;  Location: [facility] ENDOSCOPY;  Service: Endoscopy;  Laterality: N/A;   IMPLANTABLE CARDIOVERTER DEFIBRILLATOR GENERATOR CHANGE  2008   INSERTION OF MESH N/A 07/09/2018   Procedure: INSERTION OF VICRYL MESH;  Surgeon: Jimmye Norman, MD;  [facility] OR;  Service: General;  Laterality: N/A;   LAPAROTOMY N/A 09/18/2017   Procedure: EXPLORATORY LAPAROTOMY;  Surgeon: Jimmye Norman, MD;   Location: [facility] OR;  Service: General;  Laterality: N/A;   LEAD REVISION  06/22/2018   LEAD REVISION/REPAIR N/A 06/22/2018   Procedure: LEAD REVISION/REPAIR;  Surgeon: Duke Salvia, MD;  Location: [facility] INVASIVE CV LAB;  Service: Cardiovascular;  Laterality: N/A;   LYSIS OF ADHESION N/A 09/18/2017   Procedure: LYSIS OF ADHESION;  Surgeon: Jimmye Norman, MD;  Location: [facility] OR;  Service: General;  Laterality: N/A;   LYSIS OF ADHESION N/A 07/09/2018   Procedure: LYSIS OF ADHESION;  Surgeon: Jimmye Norman, MD;  Location: [facility] OR;  Service: General;  Laterality: N/A;   PARTIAL COLECTOMY N/A 09/18/2017   Procedure: ILEOCOLECTOMY;  Surgeon: Jimmye Norman, MD;  Location: [facility] OR;  Service: General;  Laterality: N/A;   PARTIAL COLECTOMY  09/25/2017   POLYPECTOMY  03/13/2020   Procedure: POLYPECTOMY;  Surgeon: Jeani Hawking, MD;  [facility] ENDOSCOPY;  Service: Endoscopy;;   RIGHT/LEFT HEART CATH AND CORONARY ANGIOGRAPHY N/A 06/01/2018   Procedure: RIGHT/LEFT HEART CATH AND CORONARY ANGIOGRAPHY;  Surgeon: Corky Crafts, MD;  Location: [facility] INVASIVE CV LAB;  Service: Cardiovascular;  Laterality: N/A;   SIGMOIDOSCOPY N/A 09/18/2017   Procedure: SIGMOIDOSCOPY;  Surgeon: Jimmye Norman, MD;  [facility];  Service: General;  Laterality: N/A;   SMALL INTESTINE SURGERY  07/09/2018   EXPLORATION OF  ABDOMINAL WOUND ERAS PATHWAYN/; MESH; LYSIS OF ADHESIONS   TOTAL ABDOMINAL HYSTERECTOMY  1990   TRANSFORAMINAL LUMBAR INTERBODY FUSION (TLIF) WITH PEDICLE SCREW FIXATION 2 LEVEL N/A 01/07/2020   Procedure: Lumbar Four-Five and Lumbar Five-Sacral One open lumbar decompression and transforaminal lumbar interbody fusion;  Surgeon: Jadene Pierini, MD;  [facility] OR;  Service: Neurosurgery;  Laterality: N/A;  Lumbar Four-Five and Lumbar Five-Sacral One open lumbar decompression and transforaminal lumbar interbody fusion   TRIGGER FINGER RELEASE Right 05/213   TRIGGER FINGER RELEASE Right 04/08/2019   Procedure: RIGHT  INDEX RELEASE TRIGGER FINGER/A-1 PULLEY;  Surgeon: Mack Hook, MD;  Location: [facility] OR;  Service: Orthopedics;  Laterality: Right;   TUBAL LIGATION  1980s   VESICOVAGINAL FISTULA CLOSURE W/ TAH     Past Surgical History:  Procedure Laterality Date   APPENDECTOMY  07/13/2017   BLADDER SUSPENSION  1990   "w/hysterectomy"   BOWEL RESECTION N/A 07/09/2018   Procedure: SMALL BOWEL RESECTION;  Surgeon: Jimmye Norman, MD;  [facility];  Service: General;  Laterality: N/A;   CARDIAC CATHETERIZATION  2005   no obstructive CAD per patient   CARDIAC CATHETERIZATION  2018   CARDIAC DEFIBRILLATOR PLACEMENT  2005   BiV ICD implanted,  LV lead is an epicardial lead   CARPAL TUNNEL RELEASE Left 07/25/2014   Dr.Williamson    CARPAL TUNNEL RELEASE Right 04/08/2019   Procedure: RIGHT CARPAL TUNNEL RELEASE ENDOSCOPIC;  Surgeon: Mack Hook, MD;  Location: [facility] OR;  Service: Orthopedics;  Laterality: Right;   CATARACT EXTRACTION W/ INTRAOCULAR LENS  IMPLANT, BILATERAL Bilateral    COLONIC STENT PLACEMENT N/A 08/31/2017   Procedure: COLONIC STENT PLACEMENT;  Surgeon: Jeani Hawking, MD;  Location: [facility] ENDOSCOPY;  Service: Endoscopy;  Laterality: N/A;   COLONOSCOPY     2010-2011 Dr.Kipreos    COLOSTOMY  12/2016   Hattie Perch 01/26/2017   COLOSTOMY REVERSAL N/A 07/13/2017   Procedure: COLOSTOMY REVERSAL;  Surgeon: Jimmye Norman, MD;  [facility];  Service: General;  Laterality: N/A;   CYST REMOVAL HAND Right 05/2013   thumb   DILATION AND CURETTAGE OF UTERUS  X 5-6   ESOPHAGOGASTRODUODENOSCOPY (EGD) WITH PROPOFOL N/A 03/13/2020   Procedure: ESOPHAGOGASTRODUODENOSCOPY (EGD) WITH PROPOFOL;  Surgeon: Jeani Hawking, MD;  [facility] ENDOSCOPY;  Service: Endoscopy;  Laterality: N/A;   EXCISION MASS ABDOMINAL N/A 07/09/2018   Procedure: EXPLORATION OF  ABDOMINAL WOUND ERAS PATHWAY;  Surgeon: Jimmye Norman, MD;  Location: [facility] OR;  Service: General;  Laterality: N/A;   FLEXIBLE SIGMOIDOSCOPY N/A 08/24/2017    Procedure: Arnell Sieving;  Surgeon: Jeani Hawking, MD;  Location: [facility] ENDOSCOPY;  Service: Endoscopy;  Laterality: N/A;   FLEXIBLE SIGMOIDOSCOPY N/A 08/31/2017   Procedure: FLEXIBLE SIGMOIDOSCOPY;  Surgeon: Jeani Hawking, MD;  Location: [facility] ENDOSCOPY;  Service: Endoscopy;  Laterality: N/A;  stent placement   FLEXIBLE SIGMOIDOSCOPY N/A 03/13/2020   Procedure: FLEXIBLE SIGMOIDOSCOPY;  Surgeon: Jeani Hawking, MD;  [facility] ENDOSCOPY;  Service: Endoscopy;  Laterality: N/A;   FLEXIBLE SIGMOIDOSCOPY N/A 12/18/2020   Procedure: FLEXIBLE SIGMOIDOSCOPY;  Surgeon: Jeani Hawking, MD;  Location[facility] ENDOSCOPY;  Service: Endoscopy;  Laterality: N/A;   HEMOSTASIS CLIP PLACEMENT  03/13/2020   Procedure: HEMOSTASIS CLIP PLACEMENT;  Surgeon: Jeani Hawking, MD;  Location[facility] ENDOSCOPY;  Service: Endoscopy;;   HEMOSTASIS CLIP PLACEMENT  12/18/2020   Procedure: HEMOSTASIS CLIP PLACEMENT;  Surgeon: Jeani Hawking, MD;  Location[facility] ENDOSCOPY;  Service: Endoscopy;;   HOT HEMOSTASIS N/A 03/13/2020   Procedure: HOT HEMOSTASIS (ARGON PLASMA COAGULATION/BICAP);  Surgeon: Jeani Hawking, MD;  Location: [facility] ENDOSCOPY;  Service: Endoscopy;  Laterality: N/A;   HOT HEMOSTASIS N/A 12/18/2020   Procedure: HOT HEMOSTASIS (ARGON PLASMA COAGULATION/BICAP);  Surgeon: Jeani Hawking, MD;  Location: [facility] ENDOSCOPY;  Service: Endoscopy;  Laterality: N/A;   IMPLANTABLE CARDIOVERTER DEFIBRILLATOR GENERATOR CHANGE  2008   INSERTION OF MESH N/A 07/09/2018   Procedure: INSERTION OF VICRYL MESH;  Surgeon: Jimmye Norman, MD;  Location: [facility] OR;  Service: General;  Laterality: N/A;   LAPAROTOMY N/A 09/18/2017   Procedure: EXPLORATORY LAPAROTOMY;  Surgeon: Jimmye Norman, MD;  Location: [facility] OR;  Service: General;  Laterality: N/A;   LEAD REVISION  06/22/2018   LEAD REVISION/REPAIR N/A 06/22/2018   Procedure:  LEAD REVISION/REPAIR;  Surgeon: Duke Salvia, MD;  Location: [facility] INVASIVE CV LAB;  Service: Cardiovascular;  Laterality: N/A;   LYSIS OF  ADHESION N/A 09/18/2017   Procedure: LYSIS OF ADHESION;  Surgeon: Jimmye Norman, MD;  Location: [facility] OR;  Service: General;  Laterality: N/A;   LYSIS OF ADHESION N/A 07/09/2018   Procedure: LYSIS OF ADHESION;  Surgeon: Jimmye Norman, MD;  Location: [facility] OR;  Service: General;  Laterality: N/A;   PARTIAL COLECTOMY N/A 09/18/2017   Procedure: ILEOCOLECTOMY;  Surgeon: Jimmye Norman, MD;  Location: [facility] OR;  Service: General;  Laterality: N/A;   PARTIAL COLECTOMY  09/25/2017   POLYPECTOMY  03/13/2020   Procedure: POLYPECTOMY;  Surgeon: Jeani Hawking, MD;  Location[facility] ENDOSCOPY;  Service: Endoscopy;;   RIGHT/LEFT HEART CATH AND CORONARY ANGIOGRAPHY N/A 06/01/2018   Procedure: RIGHT/LEFT HEART CATH AND CORONARY ANGIOGRAPHY;  Surgeon: Corky Crafts, MD;  Location: [facility] INVASIVE CV LAB;  Service: Cardiovascular;  Laterality: N/A;   SIGMOIDOSCOPY N/A 09/18/2017   Procedure: SIGMOIDOSCOPY;  Surgeon: Jimmye Norman, MD;  [facility];  Service: General;  Laterality: N/A;   SMALL INTESTINE SURGERY  07/09/2018   EXPLORATION OF  ABDOMINAL WOUND ERAS PATHWAYN/; MESH; LYSIS OF ADHESIONS   TOTAL ABDOMINAL HYSTERECTOMY  1990   TRANSFORAMINAL LUMBAR INTERBODY FUSION (TLIF) WITH PEDICLE SCREW FIXATION 2 LEVEL N/A 01/07/2020   Procedure: Lumbar Four-Five and Lumbar Five-Sacral One open lumbar decompression and transforaminal lumbar interbody fusion;  Surgeon: Jadene Pierini, MD;  Location: [facility] OR;  Service: Neurosurgery;  Laterality: N/A;  Lumbar Four-Five and Lumbar Five-Sacral One open lumbar decompression and transforaminal lumbar interbody fusion   TRIGGER FINGER RELEASE Right 05/213   TRIGGER FINGER RELEASE Right 04/08/2019   Procedure: RIGHT INDEX RELEASE TRIGGER FINGER/A-1 PULLEY;  Surgeon: Mack Hook, MD;  Location: [facility] OR;  Service: Orthopedics;  Laterality: Right;   TUBAL LIGATION  1980s   VESICOVAGINAL FISTULA CLOSURE W/ TAH     Past Medical History:  Diagnosis Date   AICD (automatic  cardioverter/defibrillator) present    high RV threshold chronically, device was turned off in 2014; Device battery has been dead x 7 years; "turned it back on 06/22/2018"   Anemia, iron deficiency    Angioedema    felt to likely be due to ace inhibitors but says she has had this even off of medicines, appears to be tolerating ARBs chronically   Anxiety    Arthritis    "hands, legs, arms; bad in my back" (06/22/2018)   Asthmatic bronchitis with status asthmaticus    Bradycardia    CHF (congestive heart failure) (HCC)    Chronic lower back pain    Chronic pain    CKD (chronic kidney disease), stage III (HCC)    Coronary artery disease    Mild nonobstructive (30% LAD, 30% RCA) by 09/01/16 cath at [facility]   Degenerative joint disease    Depression    Diabetes mellitus, type 2 (HCC)    Diabetic peripheral neuropathy (HCC)    Dizziness    Dyspnea on exertion    Family history of adverse reaction to anesthesia    Mother has nausea   GERD (gastroesophageal reflux disease)    Gout    Heart valve disorder    History of blood transfusion 1981; ~ 2005; 12/2016   "childbirth; defibrillator OR; colostomy OR"   History of mononucleosis 03/2014   Hyperlipidemia    Hypertension    Hypothyroid    Insomnia    Intervertebral disc  degeneration    Ischemic cardiomyopathy    LBBB (left bundle branch block)    Myocardial infarction [facility]) dx'd ~ 2005   Nonischemic cardiomyopathy (HCC)    s/p ICD in 2005. EF has since recovered   Obesity    On home oxygen therapy    "2L at night" (06/22/2018)   OSA on CPAP    Pneumonia    "couple times; last time was 12/2016" (06/22/2018)   PONV (postoperative nausea and vomiting)    Presence of permanent cardiac pacemaker    [employer]   Swelling    Syncope    Systemic hypertension    Vitamin D deficiency    BP (!) 89/44   Pulse 73   Ht 5\' 4"  (1.626 m)   Wt 250 lb (113.4 kg)   SpO2 91%   BMI 42.91 kg/m   Opioid Risk Score:   Fall  Risk Score:  `1  Depression screen [facility] 2/9     09/11/2023   10:15 AM 08/10/2023   10:54 AM 07/10/2023    9:59 AM 06/15/2023   11:10 AM 03/29/2023   11:15 AM 02/23/2023   10:54 AM 02/23/2023   10:47 AM  Depression screen PHQ 2/9  Decreased Interest 0 0 0 0 0 0 0  Down, Depressed, Hopeless 0 0 0 0 0 0 0  PHQ - 2 Score 0 0 0 0 0 0 0     Review of Systems  Musculoskeletal:  Positive for back pain, gait problem and neck pain.       Bilateral knee pain Right wrist pain  All other systems reviewed and are negative.      Objective:   Physical Exam Vitals and nursing note reviewed.  Constitutional:      Appearance: Normal appearance.  Cardiovascular:     Rate and Rhythm: Normal rate and regular rhythm.  Pulmonary:     Effort: Pulmonary effort is normal.     Breath sounds: Normal breath sounds.  Musculoskeletal:     Comments: Normal Muscle Bulk and Muscle Testing Reveals:  Upper Extremities: Full ROM and Muscle Strength 5/5 Thoracic Paraspinal Tenderness: T-7-T-10 Lumbar Paraspinal Tenderness: L-4-L-5 Lower Extremities :Full ROM and Muscle Strength 5/5 Arises from Table slowlyy using walker for support Narrow Based  Gait     Skin:    General: Skin is warm and dry.  Neurological:     Mental Status: She is alert and oriented to person, place, and time.  Psychiatric:        Mood and Affect: Mood normal.        Behavior: Behavior normal.           Assessment & Plan:  Chronic Low Back Pain: Encouraged to increase HEP as Tolerated. Continue to Monitor. 10/03/2023 2. Bilateral Primary Osteoarthritis:  S/P Right Knee Genicular nerve radiofrequency neurotomy, with Dr Wynn Banker. On 08/02/2022 and S/P Left Knee Genicular Nerve Radiofrequency on 08/16/2022. With good relief noted.  : Indication Chronic post operative pain in the Knee, pain postop total knee replacement which has not responded to conservative management such as physical therapy and medication management; Continue  monitor. 10/03/2023 3. Myofascial Pain: Continue HEP as Tolerated. Continue to Monitor.  10/03/2023 4. Chronic Pain Syndrome: Refilled: Oxycodone 7.5mg  /325 mg one tablet 4 times a day as needed for pain #120. Discontinue Hydrocodone due to itching,  We will continue the opioid monitoring program, this consists of regular clinic visits, examinations, urine drug screen, pill counts as well as use of  [place] Controlled Substance Reporting system. A 12 month History has been reviewed on the [place] Controlled Substance Reporting System on 10/03/2023. 5. Polyarthralgia: Continue HEP as tolerated. Continue current medication regimen. Continue to monitor. 10/03/2023 6. Chronic Bilateral Thoracic - Back Pain: Continue HEP as Tolerated. Continue to Monitor.   F/U in 1 month

## 2023-10-05 DIAGNOSIS — E119 Type 2 diabetes mellitus without complications: Secondary | ICD-10-CM | POA: Diagnosis not present

## 2023-10-05 DIAGNOSIS — Z0100 Encounter for examination of eyes and vision without abnormal findings: Secondary | ICD-10-CM | POA: Diagnosis not present

## 2023-10-07 NOTE — Telephone Encounter (Signed)
 Would not anticoagulate for 2-3 hrs of Subclinical Atrial fibrillation (SCAF)

## 2023-10-10 ENCOUNTER — Encounter: Payer: Self-pay | Admitting: Registered Nurse

## 2023-10-10 ENCOUNTER — Encounter: Payer: Self-pay | Admitting: Internal Medicine

## 2023-10-12 DIAGNOSIS — Z794 Long term (current) use of insulin: Secondary | ICD-10-CM | POA: Diagnosis not present

## 2023-10-12 DIAGNOSIS — R0901 Asphyxia: Secondary | ICD-10-CM | POA: Diagnosis not present

## 2023-10-12 DIAGNOSIS — E1129 Type 2 diabetes mellitus with other diabetic kidney complication: Secondary | ICD-10-CM | POA: Diagnosis not present

## 2023-10-12 DIAGNOSIS — I509 Heart failure, unspecified: Secondary | ICD-10-CM | POA: Diagnosis not present

## 2023-10-12 DIAGNOSIS — I130 Hypertensive heart and chronic kidney disease with heart failure and stage 1 through stage 4 chronic kidney disease, or unspecified chronic kidney disease: Secondary | ICD-10-CM | POA: Diagnosis not present

## 2023-10-12 DIAGNOSIS — J4540 Moderate persistent asthma, uncomplicated: Secondary | ICD-10-CM | POA: Diagnosis not present

## 2023-10-12 DIAGNOSIS — N184 Chronic kidney disease, stage 4 (severe): Secondary | ICD-10-CM | POA: Diagnosis not present

## 2023-10-12 DIAGNOSIS — E785 Hyperlipidemia, unspecified: Secondary | ICD-10-CM | POA: Diagnosis not present

## 2023-10-20 NOTE — Addendum Note (Signed)
 Addended by: Elease Etienne A on: 10/20/2023 12:23 PM   Modules accepted: Orders

## 2023-10-20 NOTE — Progress Notes (Signed)
 Remote ICD transmission.

## 2023-10-27 DIAGNOSIS — E559 Vitamin D deficiency, unspecified: Secondary | ICD-10-CM | POA: Diagnosis not present

## 2023-10-27 DIAGNOSIS — N184 Chronic kidney disease, stage 4 (severe): Secondary | ICD-10-CM | POA: Diagnosis not present

## 2023-10-31 DIAGNOSIS — N184 Chronic kidney disease, stage 4 (severe): Secondary | ICD-10-CM | POA: Diagnosis not present

## 2023-10-31 DIAGNOSIS — E1122 Type 2 diabetes mellitus with diabetic chronic kidney disease: Secondary | ICD-10-CM | POA: Diagnosis not present

## 2023-10-31 DIAGNOSIS — I130 Hypertensive heart and chronic kidney disease with heart failure and stage 1 through stage 4 chronic kidney disease, or unspecified chronic kidney disease: Secondary | ICD-10-CM | POA: Diagnosis not present

## 2023-10-31 DIAGNOSIS — D509 Iron deficiency anemia, unspecified: Secondary | ICD-10-CM | POA: Diagnosis not present

## 2023-10-31 DIAGNOSIS — N2581 Secondary hyperparathyroidism of renal origin: Secondary | ICD-10-CM | POA: Diagnosis not present

## 2023-10-31 DIAGNOSIS — I5042 Chronic combined systolic (congestive) and diastolic (congestive) heart failure: Secondary | ICD-10-CM | POA: Diagnosis not present

## 2023-11-06 ENCOUNTER — Encounter: Payer: Self-pay | Admitting: Registered Nurse

## 2023-11-06 ENCOUNTER — Encounter: Attending: Registered Nurse | Admitting: Registered Nurse

## 2023-11-06 VITALS — BP 133/72 | HR 76 | Ht 64.0 in | Wt 247.0 lb

## 2023-11-06 DIAGNOSIS — Z5181 Encounter for therapeutic drug level monitoring: Secondary | ICD-10-CM

## 2023-11-06 DIAGNOSIS — M545 Low back pain, unspecified: Secondary | ICD-10-CM | POA: Diagnosis not present

## 2023-11-06 DIAGNOSIS — Z79891 Long term (current) use of opiate analgesic: Secondary | ICD-10-CM

## 2023-11-06 DIAGNOSIS — G8929 Other chronic pain: Secondary | ICD-10-CM | POA: Diagnosis not present

## 2023-11-06 DIAGNOSIS — M170 Bilateral primary osteoarthritis of knee: Secondary | ICD-10-CM

## 2023-11-06 DIAGNOSIS — M542 Cervicalgia: Secondary | ICD-10-CM | POA: Diagnosis not present

## 2023-11-06 DIAGNOSIS — M25522 Pain in left elbow: Secondary | ICD-10-CM

## 2023-11-06 DIAGNOSIS — G894 Chronic pain syndrome: Secondary | ICD-10-CM

## 2023-11-06 MED ORDER — OXYCODONE HCL 10 MG PO TABS
10.0000 mg | ORAL_TABLET | Freq: Three times a day (TID) | ORAL | 0 refills | Status: DC | PRN
Start: 2023-11-06 — End: 2023-12-13

## 2023-11-06 NOTE — Progress Notes (Unsigned)
 Subjective:    Patient ID: Crystal [name], female    DOB: [birth year], 73 y.o.   MRN: [id number]  HPI: Crystal [name] is a 73 y.o. female who returns for follow up appointment for chronic pain and medication refill. She states her pain is located in her neck, left elbow pain, she reports Ortho Following. She also reports lower back pain and bilateral knee pain R>L. She. rates her pain 5. Her current exercise regime is walking with her walker  and performing stretching exercises.  Ms. Sensabaugh Morphine equivalent is 45.00 MME.   Last Oral Swab was Performed on 08/10/2023, it was consistent.      Pain Inventory Average Pain 9 Pain Right Now 5 My pain is intermittent, constant, sharp, burning, stabbing, and aching  In the last 24 hours, has pain interfered with the following? General activity 7 Relation with others 6 Enjoyment of life 6 What TIME of day is your pain at its worst? morning , evening, and night Sleep (in general) Good  Pain is worse with: walking, bending, standing, and some activites Pain improves with: rest, medication, and exercise Relief from Meds: 7  Family History  Problem Relation Age of Onset   Hypertension Father    Heart disease Father    Cancer Father        prostate   Heart disease Other        (Maternal side) Ischemic heart disease   Diabetes Mellitus II Other    Arrhythmia Mother    Diabetes Mellitus II Mother        Borderline DM   Hypertension Mother    Asthma Mother    Cancer Brother    Dementia Brother    Hypertension Brother    Hypertension Daughter    Hypertension Daughter    Diabetes Daughter    Hypertension Daughter    Atrial fibrillation Daughter    GER disease Daughter    Hypertension Son    Anxiety disorder Son    Hypothyroidism Son    Breast cancer Maternal Grandmother        in her 51's   Social History   Socioeconomic History   Marital status: Widowed    Spouse name: Not on file   Number of children: Not on file   Years  of education: Not on file   Highest education level: Not on file  Occupational History    Comment: retired LPN  Tobacco Use   Smoking status: Never   Smokeless tobacco: Never  Vaping Use   Vaping status: Never Used  Substance and Sexual Activity   Alcohol use: Not Currently   Drug use: Never   Sexual activity: Not Currently  Other Topics Concern   Not on file  Social History Narrative   [place].  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      As of 03/15/17:   Diet: 1800 Calorie      Caffeine: Yes      Married, if yes what year: Widowed, married 1973      Do you live in a house, apartment, assisted living, [place], trailer, ect: House, 1 stories, and 1 person      Pets: No      Current/Past profession: LPN, retired       Exercise: Yes, walking          Living Will: Yes   DNR: No   POA/HPOA: Yes  Functional Status:   Do you have difficulty bathing or dressing yourself? No   Do you have difficulty preparing food or eating? No   Do you have difficulty managing your medications? No   Do you have difficulty managing your finances? No   Do you have difficulty affording your medications? Yes   Social Drivers of Corporate investment banker Strain: Low Risk  (10/11/2017)   Overall Financial Resource Strain (CARDIA)    Difficulty of Paying Living Expenses: Not hard at all  Food Insecurity: No Food Insecurity (07/06/2023)   Hunger Vital Sign    Worried About Running Out of Food in the Last Year: Never true    Ran Out of Food in the Last Year: Never true  Transportation Needs: No Transportation Needs (07/06/2023)   PRAPARE - Administrator, Civil Service (Medical): No    Lack of Transportation (Non-Medical): No  Physical Activity: Inactive (10/11/2017)   Exercise Vital Sign    Days of Exercise per Week: 0 days    Minutes of Exercise per Session: 0 min  Stress: Stress Concern Present  (10/11/2017)   [name] - Occupational Stress Questionnaire    Feeling of Stress : To some extent  Social Connections: Somewhat Isolated (10/11/2017)   Social Connection and Isolation Panel [NHANES]    Frequency of Communication with Friends and Family: More than three times a week    Frequency of Social Gatherings with Friends and Family: More than three times a week    Attends Religious Services: More than 4 times per year    Active Member of [employer] or Organizations: No    Attends Banker Meetings: Never    Marital Status: Widowed   Past Surgical History:  Procedure Laterality Date   APPENDECTOMY  07/13/2017   BLADDER SUSPENSION  1990   "w/hysterectomy"   BOWEL RESECTION N/A 07/09/2018   Procedure: SMALL BOWEL RESECTION;  Surgeon: Jerryl Morin, MD;  [facility]: [facility];  Service: General;  Laterality: N/A;   CARDIAC CATHETERIZATION  2005   no obstructive CAD per patient   CARDIAC CATHETERIZATION  2018   CARDIAC DEFIBRILLATOR PLACEMENT  2005   BiV ICD implanted,  LV lead is an epicardial lead   CARPAL TUNNEL RELEASE Left 07/25/2014   Dr.Williamson    CARPAL TUNNEL RELEASE Right 04/08/2019   Procedure: RIGHT CARPAL TUNNEL RELEASE ENDOSCOPIC;  Surgeon: Rober Chimera, MD;  Location[facility] OR;  Service: Orthopedics;  Laterality: Right;   CATARACT EXTRACTION W/ INTRAOCULAR LENS  IMPLANT, BILATERAL Bilateral    COLONIC STENT PLACEMENT N/A 08/31/2017   Procedure: COLONIC STENT PLACEMENT;  Surgeon: Alvis Jourdain, MD;  Location: [facility] ENDOSCOPY;  Service: Endoscopy;  Laterality: N/A;   COLONOSCOPY     2010-2011 Dr.Kipreos    COLOSTOMY  12/2016   Maximo Spar 01/26/2017   COLOSTOMY REVERSAL N/A 07/13/2017   Procedure: COLOSTOMY REVERSAL;  Surgeon: Jerryl Morin, MD;  [facility]: [facility];  Service: General;  Laterality: N/A;   CYST REMOVAL HAND Right 05/2013   thumb   DILATION AND CURETTAGE OF UTERUS  X 5-6   ESOPHAGOGASTRODUODENOSCOPY (EGD) WITH PROPOFOL N/A 03/13/2020    Procedure: ESOPHAGOGASTRODUODENOSCOPY (EGD) WITH PROPOFOL;  Surgeon: Alvis Jourdain, MD;  Location: [facility] ENDOSCOPY;  Service: Endoscopy;  Laterality: N/A;   EXCISION MASS ABDOMINAL N/A 07/09/2018   Procedure: EXPLORATION OF  ABDOMINAL WOUND ERAS PATHWAY;  Surgeon: Jerryl Morin, MD;  [facility] OR;  Service: General;  Laterality: N/A;   FLEXIBLE SIGMOIDOSCOPY N/A  08/24/2017   Procedure: FLEXIBLE SIGMOIDOSCOPY;  Surgeon: Alvis Jourdain, MD;  Location: [facility] ENDOSCOPY;  Service: Endoscopy;  Laterality: N/A;   FLEXIBLE SIGMOIDOSCOPY N/A 08/31/2017   Procedure: FLEXIBLE SIGMOIDOSCOPY;  Surgeon: Alvis Jourdain, MD;  Location: [facility] ENDOSCOPY;  Service: Endoscopy;  Laterality: N/A;  stent placement   FLEXIBLE SIGMOIDOSCOPY N/A 03/13/2020   Procedure: FLEXIBLE SIGMOIDOSCOPY;  Surgeon: Alvis Jourdain, MD;  Location[facility] ENDOSCOPY;  Service: Endoscopy;  Laterality: N/A;   FLEXIBLE SIGMOIDOSCOPY N/A 12/18/2020   Procedure: FLEXIBLE SIGMOIDOSCOPY;  Surgeon: Alvis Jourdain, MD;  Location: [facility] ENDOSCOPY;  Service: Endoscopy;  Laterality: N/A;   HEMOSTASIS CLIP PLACEMENT  03/13/2020   Procedure: HEMOSTASIS CLIP PLACEMENT;  Surgeon: Alvis Jourdain, MD;  [facility] ENDOSCOPY;  Service: Endoscopy;;   HEMOSTASIS CLIP PLACEMENT  12/18/2020   Procedure: HEMOSTASIS CLIP PLACEMENT;  Surgeon: Alvis Jourdain, MD;  [facility] ENDOSCOPY;  Service: Endoscopy;;   HOT HEMOSTASIS N/A 03/13/2020   Procedure: HOT HEMOSTASIS (ARGON PLASMA COAGULATION/BICAP);  Surgeon: Alvis Jourdain, MD;  Location: [facility]OSCOPY;  Service: Endoscopy;  Laterality: N/A;   HOT HEMOSTASIS N/A 12/18/2020   Procedure: HOT HEMOSTASIS (ARGON PLASMA COAGULATION/BICAP);  Surgeon: Alvis Jourdain, MD;  Location: [facility] ENDOSCOPY;  Service: Endoscopy;  Laterality: N/A;   IMPLANTABLE CARDIOVERTER DEFIBRILLATOR GENERATOR CHANGE  2008   INSERTION OF MESH N/A 07/09/2018   Procedure: INSERTION OF VICRYL MESH;  Surgeon: Jerryl Morin, MD;  [facility] OR;  Service: General;  Laterality:  N/A;   LAPAROTOMY N/A 09/18/2017   Procedure: EXPLORATORY LAPAROTOMY;  Surgeon: Jerryl Morin, MD;  Location: [facility] OR;  Service: General;  Laterality: N/A;   LEAD REVISION  06/22/2018   LEAD REVISION/REPAIR N/A 06/22/2018   Procedure: LEAD REVISION/REPAIR;  Surgeon: Verona Goodwill, MD;  Location: [facility] INVASIVE CV LAB;  Service: Cardiovascular;  Laterality: N/A;   LYSIS OF ADHESION N/A 09/18/2017   Procedure: LYSIS OF ADHESION;  Surgeon: Jerryl Morin, MD;  Location: [facility] OR;  Service: General;  Laterality: N/A;   LYSIS OF ADHESION N/A 07/09/2018   Procedure: LYSIS OF ADHESION;  Surgeon: Jerryl Morin, MD;  Location: [facility] OR;  Service: General;  Laterality: N/A;   PARTIAL COLECTOMY N/A 09/18/2017   Procedure: ILEOCOLECTOMY;  Surgeon: Jerryl Morin, MD;  Location: [facility] OR;  Service: General;  Laterality: N/A;   PARTIAL COLECTOMY  09/25/2017   POLYPECTOMY  03/13/2020   Procedure: POLYPECTOMY;  Surgeon: Alvis Jourdain, MD;  Location[facility] ENDOSCOPY;  Service: Endoscopy;;   RIGHT/LEFT HEART CATH AND CORONARY ANGIOGRAPHY N/A 06/01/2018   Procedure: RIGHT/LEFT HEART CATH AND CORONARY ANGIOGRAPHY;  Surgeon: Lucendia Rusk, MD;  Location[facility] INVASIVE CV LAB;  Service: Cardiovascular;  Laterality: N/A;   SIGMOIDOSCOPY N/A 09/18/2017   Procedure: SIGMOIDOSCOPY;  Surgeon: Jerryl Morin, MD;  [facility] OR;  Service: General;  Laterality: N/A;   SMALL INTESTINE SURGERY  07/09/2018   EXPLORATION OF  ABDOMINAL WOUND ERAS PATHWAYN/; MESH; LYSIS OF ADHESIONS   TOTAL ABDOMINAL HYSTERECTOMY  1990   TRANSFORAMINAL LUMBAR INTERBODY FUSION (TLIF) WITH PEDICLE SCREW FIXATION 2 LEVEL N/A 01/07/2020   Procedure: Lumbar Four-Five and Lumbar Five-Sacral One open lumbar decompression and transforaminal lumbar interbody fusion;  Surgeon: Cannon Champion, MD;  Location[facility] OR;  Service: Neurosurgery;  Laterality: N/A;  Lumbar Four-Five and Lumbar Five-Sacral One open lumbar decompression and transforaminal lumbar interbody fusion    TRIGGER FINGER RELEASE Right 05/213   TRIGGER FINGER RELEASE Right 04/08/2019   Procedure: RIGHT INDEX RELEASE TRIGGER FINGER/A-1 PULLEY;  Surgeon: Rober Chimera, MD;  Location: [facility]  OR;  Service: Orthopedics;  Laterality: Right;   TUBAL LIGATION  1980s   VESICOVAGINAL FISTULA CLOSURE W/ TAH     Past Surgical History:  Procedure Laterality Date   APPENDECTOMY  07/13/2017   BLADDER SUSPENSION  1990   "w/hysterectomy"   BOWEL RESECTION N/A 07/09/2018   Procedure: SMALL BOWEL RESECTION;  Surgeon: Jimmye Norman, MD;  [facility];  Service: General;  Laterality: N/A;   CARDIAC CATHETERIZATION  2005   no obstructive CAD per patient   CARDIAC CATHETERIZATION  2018   CARDIAC DEFIBRILLATOR PLACEMENT  2005   BiV ICD implanted,  LV lead is an epicardial lead   CARPAL TUNNEL RELEASE Left 07/25/2014   Dr.Williamson    CARPAL TUNNEL RELEASE Right 04/08/2019   Procedure: RIGHT CARPAL TUNNEL RELEASE ENDOSCOPIC;  Surgeon: Mack Hook, MD;  Location[facility] OR;  Service: Orthopedics;  Laterality: Right;   CATARACT EXTRACTION W/ INTRAOCULAR LENS  IMPLANT, BILATERAL Bilateral    COLONIC STENT PLACEMENT N/A 08/31/2017   Procedure: COLONIC STENT PLACEMENT;  Surgeon: Jeani Hawking, MD;  Location: [facility] ENDOSCOPY;  Service: Endoscopy;  Laterality: N/A;   COLONOSCOPY     2010-2011 Dr.Kipreos    COLOSTOMY  12/2016   Hattie Perch 01/26/2017   COLOSTOMY REVERSAL N/A 07/13/2017   Procedure: COLOSTOMY REVERSAL;  Surgeon: Jimmye Norman, MD;  Location[facility] OR;  Service: General;  Laterality: N/A;   CYST REMOVAL HAND Right 05/2013   thumb   DILATION AND CURETTAGE OF UTERUS  X 5-6   ESOPHAGOGASTRODUODENOSCOPY (EGD) WITH PROPOFOL N/A 03/13/2020   Procedure: ESOPHAGOGASTRODUODENOSCOPY (EGD) WITH PROPOFOL;  Surgeon: Jeani Hawking, MD;  Location[facility] ENDOSCOPY;  Service: Endoscopy;  Laterality: N/A;   EXCISION MASS ABDOMINAL N/A 07/09/2018   Procedure: EXPLORATION OF  ABDOMINAL WOUND ERAS PATHWAY;  Surgeon: Jimmye Norman, MD;   Location: [facility] OR;  Service: General;  Laterality: N/A;   FLEXIBLE SIGMOIDOSCOPY N/A 08/24/2017   Procedure: Arnell Sieving;  Surgeon: Jeani Hawking, MD;  Location: [facility] ENDOSCOPY;  Service: Endoscopy;  Laterality: N/A;   FLEXIBLE SIGMOIDOSCOPY N/A 08/31/2017   Procedure: FLEXIBLE SIGMOIDOSCOPY;  Surgeon: Jeani Hawking, MD;  Location: [facility] ENDOSCOPY;  Service: Endoscopy;  Laterality: N/A;  stent placement   FLEXIBLE SIGMOIDOSCOPY N/A 03/13/2020   Procedure: FLEXIBLE SIGMOIDOSCOPY;  Surgeon: Jeani Hawking, MD;  Location: [facility] ENDOSCOPY;  Service: Endoscopy;  Laterality: N/A;   FLEXIBLE SIGMOIDOSCOPY N/A 12/18/2020   Procedure: FLEXIBLE SIGMOIDOSCOPY;  Surgeon: Jeani Hawking, MD;  Location: [facility] ENDOSCOPY;  Service: Endoscopy;  Laterality: N/A;   HEMOSTASIS CLIP PLACEMENT  03/13/2020   Procedure: HEMOSTASIS CLIP PLACEMENT;  Surgeon: Jeani Hawking, MD;  Location: [facility] ENDOSCOPY;  Service: Endoscopy;;   HEMOSTASIS CLIP PLACEMENT  12/18/2020   Procedure: HEMOSTASIS CLIP PLACEMENT;  Surgeon: Jeani Hawking, MD;  Location: [facility] ENDOSCOPY;  Service: Endoscopy;;   HOT HEMOSTASIS N/A 03/13/2020   Procedure: HOT HEMOSTASIS (ARGON PLASMA COAGULATION/BICAP);  Surgeon: Jeani Hawking, MD;  Location: [facility] ENDOSCOPY;  Service: Endoscopy;  Laterality: N/A;   HOT HEMOSTASIS N/A 12/18/2020   Procedure: HOT HEMOSTASIS (ARGON PLASMA COAGULATION/BICAP);  Surgeon: Jeani Hawking, MD;  Location: [facility] ENDOSCOPY;  Service: Endoscopy;  Laterality: N/A;   IMPLANTABLE CARDIOVERTER DEFIBRILLATOR GENERATOR CHANGE  2008   INSERTION OF MESH N/A 07/09/2018   Procedure: INSERTION OF VICRYL MESH;  Surgeon: Jimmye Norman, MD;  Location: [facility] OR;  Service: General;  Laterality: N/A;   LAPAROTOMY N/A 09/18/2017   Procedure: EXPLORATORY LAPAROTOMY;  Surgeon: Jimmye Norman, MD;  Location: [facility] OR;  Service: General;  Laterality: N/A;   LEAD  REVISION  06/22/2018   LEAD REVISION/REPAIR N/A 06/22/2018   Procedure: LEAD REVISION/REPAIR;  Surgeon: Duke Salvia,  MD;  Location: [facility] INVASIVE CV LAB;  Service: Cardiovascular;  Laterality: N/A;   LYSIS OF ADHESION N/A 09/18/2017   Procedure: LYSIS OF ADHESION;  Surgeon: Jimmye Norman, MD;  Location: [facility] OR;  Service: General;  Laterality: N/A;   LYSIS OF ADHESION N/A 07/09/2018   Procedure: LYSIS OF ADHESION;  Surgeon: Jimmye Norman, MD;  Location: [facility] OR;  Service: General;  Laterality: N/A;   PARTIAL COLECTOMY N/A 09/18/2017   Procedure: ILEOCOLECTOMY;  Surgeon: Jimmye Norman, MD;  Location: [facility] OR;  Service: General;  Laterality: N/A;   PARTIAL COLECTOMY  09/25/2017   POLYPECTOMY  03/13/2020   Procedure: POLYPECTOMY;  Surgeon: Jeani Hawking, MD;  Location: [facility] ENDOSCOPY;  Service: Endoscopy;;   RIGHT/LEFT HEART CATH AND CORONARY ANGIOGRAPHY N/A 06/01/2018   Procedure: RIGHT/LEFT HEART CATH AND CORONARY ANGIOGRAPHY;  Surgeon: Corky Crafts, MD;  Location: [facility] INVASIVE CV LAB;  Service: Cardiovascular;  Laterality: N/A;   SIGMOIDOSCOPY N/A 09/18/2017   Procedure: SIGMOIDOSCOPY;  Surgeon: Jimmye Norman, MD;  Location[facility] OR;  Service: General;  Laterality: N/A;   SMALL INTESTINE SURGERY  07/09/2018   EXPLORATION OF  ABDOMINAL WOUND ERAS PATHWAYN/; MESH; LYSIS OF ADHESIONS   TOTAL ABDOMINAL HYSTERECTOMY  1990   TRANSFORAMINAL LUMBAR INTERBODY FUSION (TLIF) WITH PEDICLE SCREW FIXATION 2 LEVEL N/A 01/07/2020   Procedure: Lumbar Four-Five and Lumbar Five-Sacral One open lumbar decompression and transforaminal lumbar interbody fusion;  Surgeon: Jadene Pierini, MD;  Location: [facility] OR;  Service: Neurosurgery;  Laterality: N/A;  Lumbar Four-Five and Lumbar Five-Sacral One open lumbar decompression and transforaminal lumbar interbody fusion   TRIGGER FINGER RELEASE Right 05/213   TRIGGER FINGER RELEASE Right 04/08/2019   Procedure: RIGHT INDEX RELEASE TRIGGER FINGER/A-1 PULLEY;  Surgeon: Mack Hook, MD;  Location: [facility] OR;  Service: Orthopedics;  Laterality: Right;   TUBAL LIGATION  1980s   VESICOVAGINAL FISTULA  CLOSURE W/ TAH     Past Medical History:  Diagnosis Date   AICD (automatic cardioverter/defibrillator) present    high RV threshold chronically, device was turned off in 2014; Device battery has been dead x 7 years; "turned it back on 06/22/2018"   Anemia, iron deficiency    Angioedema    felt to likely be due to ace inhibitors but says she has had this even off of medicines, appears to be tolerating ARBs chronically   Anxiety    Arthritis    "hands, legs, arms; bad in my back" (06/22/2018)   Asthmatic bronchitis with status asthmaticus    Bradycardia    CHF (congestive heart failure) (HCC)    Chronic lower back pain    Chronic pain    CKD (chronic kidney disease), stage III (HCC)    Coronary artery disease    Mild nonobstructive (30% LAD, 30% RCA) by 09/01/16 cath at [facility]   Degenerative joint disease    Depression    Diabetes mellitus, type 2 (HCC)    Diabetic peripheral neuropathy (HCC)    Dizziness    Dyspnea on exertion    Family history of adverse reaction to anesthesia    Mother has nausea   GERD (gastroesophageal reflux disease)    Gout    Heart valve disorder    History of blood transfusion 1981; ~ 2005; 12/2016   "childbirth; defibrillator OR; colostomy OR"   History of mononucleosis 03/2014   Hyperlipidemia    Hypertension  Hypothyroid    Insomnia    Intervertebral disc degeneration    Ischemic cardiomyopathy    LBBB (left bundle branch block)    Myocardial infarction (HCC) dx'd ~ 2005   Nonischemic cardiomyopathy (HCC)    s/p ICD in 2005. EF has since recovered   Obesity    On home oxygen therapy    "2L at night" (06/22/2018)   OSA on CPAP    Pneumonia    "couple times; last time was 12/2016" (06/22/2018)   PONV (postoperative nausea and vomiting)    Presence of permanent cardiac pacemaker    [employer]   Swelling    Syncope    Systemic hypertension    Vitamin D deficiency    There were no vitals taken for this visit.  Opioid  Risk Score:   Fall Risk Score:  `1  Depression screen [facility] 2/9     09/11/2023   10:15 AM 08/10/2023   10:54 AM 07/10/2023    9:59 AM 06/15/2023   11:10 AM 03/29/2023   11:15 AM 02/23/2023   10:54 AM 02/23/2023   10:47 AM  Depression screen PHQ 2/9  Decreased Interest 0 0 0 0 0 0 0  Down, Depressed, Hopeless 0 0 0 0 0 0 0  PHQ - 2 Score 0 0 0 0 0 0 0    Review of Systems  Musculoskeletal:  Positive for back pain, gait problem and neck pain.       Pain in : left elbow, both knees  All other systems reviewed and are negative.     Objective:   Physical Exam Vitals and nursing note reviewed.  Constitutional:      Appearance: Normal appearance. She is obese.  Cardiovascular:     Rate and Rhythm: Normal rate and regular rhythm.     Pulses: Normal pulses.     Heart sounds: Normal heart sounds.  Pulmonary:     Effort: Pulmonary effort is normal.     Breath sounds: Normal breath sounds.  Musculoskeletal:     Comments: Normal Muscle Bulk and Muscle Testing Reveals:  Upper Extremities: Full ROM and Muscle Strength 5/5 Lumbar Paraspinal Tenderness: L-4-L-5  Lower Extremities: Full ROM and Muscle Strength 5/5 Bilateral Lower Extremities Flexion Produces Pain into her Bilateral Patella's Arises from table slowly using walker for support  Antalgic  Gait     Skin:    General: Skin is warm and dry.  Neurological:     Mental Status: She is alert and oriented to person, place, and time.  Psychiatric:        Mood and Affect: Mood normal.        Behavior: Behavior normal.         Assessment & Plan:  Chronic Low Back Pain: Encouraged to increase HEP as Tolerated. Continue to Monitor. 11/06/2023 2. Bilateral Primary Osteoarthritis:  S/P Right Knee Genicular nerve radiofrequency neurotomy, with Dr Wynn Banker. On 08/02/2022 and S/P Left Knee Genicular Nerve Radiofrequency on 08/16/2022. With good relief noted.  : Indication Chronic post operative pain in the Knee, pain postop total knee  replacement which has not responded to conservative management such as physical therapy and medication management; Continue monitor. 11/06/2023 3. Myofascial Pain: Continue HEP as Tolerated. Continue to Monitor.  11/06/2023 4. Chronic Pain Syndrome: RX Oxycodone 7.5mg  /325 mg, changed to Oxycodone 10 mg three times a day as needed for pain, #90, due to Cost of medication.  Discontinue Hydrocodone due to itching,  We will continue the opioid  monitoring program, this consists of regular clinic visits, examinations, urine drug screen, pill counts as well as use of [place]  Controlled Substance Reporting system. A 12 month History has been reviewed on the [place]  Controlled Substance Reporting System on 11/06/2023. 5. Polyarthralgia: Continue HEP as tolerated. Continue current medication regimen. Continue to monitor. 11/06/2023 6. Chronic Bilateral Thoracic - Back Pain: no complaints today. Continue HEP as Tolerated. Continue to Monitor. 11/06/2023.    F/U in 1 month

## 2023-12-04 NOTE — Progress Notes (Deleted)
 Subjective:    Patient ID: Crystal [name], female    DOB: 1950/11/26, 73 y.o.   MRN: [id number]  HPI   Pain Inventory Average Pain {NUMBERS; 0-10:5044} Pain Right Now {NUMBERS; 0-10:5044} My pain is {PAIN DESCRIPTION:21022940}  In the last 24 hours, has pain interfered with the following? General activity {NUMBERS; 0-10:5044} Relation with others {NUMBERS; 0-10:5044} Enjoyment of life {NUMBERS; 0-10:5044} What TIME of day is your pain at its worst? {time of day:24191} Sleep (in general) {BHH GOOD/FAIR/POOR:22877}  Pain is worse with: {ACTIVITIES:21022942} Pain improves with: {PAIN IMPROVES IONG:29528413} Relief from Meds: {NUMBERS; 0-10:5044}  Family History  Problem Relation Age of Onset   Hypertension Father    Heart disease Father    Cancer Father        prostate   Heart disease Other        (Maternal side) Ischemic heart disease   Diabetes Mellitus II Other    Arrhythmia Mother    Diabetes Mellitus II Mother        Borderline DM   Hypertension Mother    Asthma Mother    Cancer Brother    Dementia Brother    Hypertension Brother    Hypertension Daughter    Hypertension Daughter    Diabetes Daughter    Hypertension Daughter    Atrial fibrillation Daughter    GER disease Daughter    Hypertension Son    Anxiety disorder Son    Hypothyroidism Son    Breast cancer Maternal Grandmother        in her 49's   Social History   Socioeconomic History   Marital status: Widowed    Spouse name: Not on file   Number of children: Not on file   Years of education: Not on file   Highest education level: Not on file  Occupational History    Comment: retired LPN  Tobacco Use   Smoking status: Never   Smokeless tobacco: Never  Vaping Use   Vaping status: Never Used  Substance and Sexual Activity   Alcohol  use: Not Currently   Drug use: Never   Sexual activity: Not Currently  Other Topics Concern   Not on file  Social History Narrative   [place] lives in [place]  [place] alone.  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      As of 03/15/17:   Diet: 1800 Calorie      Caffeine: Yes      Married, if yes what year: Widowed, married 1973      Do you live in a house, apartment, assisted living, condo, trailer, ect: House, 1 stories, and 1 person      Pets: No      Current/Past profession: LPN, retired       Exercise: Yes, walking          Living Will: Yes   DNR: No   POA/HPOA: Yes      Functional Status:   Do you have difficulty bathing or dressing yourself? No   Do you have difficulty preparing food or eating? No   Do you have difficulty managing your medications? No   Do you have difficulty managing your finances? No   Do you have difficulty affording your medications? Yes   Social Drivers of Corporate investment banker Strain: Low Risk  (10/11/2017)   Overall Financial Resource Strain (CARDIA)    Difficulty of Paying Living Expenses: Not hard at all  Food  Insecurity: No Food Insecurity (07/06/2023)   Hunger Vital Sign    Worried About Running Out of Food in the Last Year: Never true    Ran Out of Food in the Last Year: Never true  Transportation Needs: No Transportation Needs (07/06/2023)   PRAPARE - Administrator, Civil Service (Medical): No    Lack of Transportation (Non-Medical): No  Physical Activity: Inactive (10/11/2017)   Exercise Vital Sign    Days of Exercise per Week: 0 days    Minutes of Exercise per Session: 0 min  Stress: Stress Concern Present (10/11/2017)   [name] - Occupational Stress Questionnaire    Feeling of Stress : To some extent  Social Connections: Somewhat Isolated (10/11/2017)   Social Connection and Isolation Panel [NHANES]    Frequency of Communication with Friends and Family: More than three times a week    Frequency of Social Gatherings with Friends and Family: More than three times a week    Attends Religious  Services: More than 4 times per year    Active Member of [employer] or Organizations: No    Attends Banker Meetings: Never    Marital Status: Widowed   Past Surgical History:  Procedure Laterality Date   APPENDECTOMY  07/13/2017   BLADDER SUSPENSION  1990   "w/hysterectomy"   BOWEL RESECTION N/A 07/09/2018   Procedure: SMALL BOWEL RESECTION;  Surgeon: Jerryl Morin, MD;  [facility];  Service: General;  Laterality: N/A;   CARDIAC CATHETERIZATION  2005   no obstructive CAD per patient   CARDIAC CATHETERIZATION  2018   CARDIAC DEFIBRILLATOR PLACEMENT  2005   BiV ICD implanted,  LV lead is an epicardial lead   CARPAL TUNNEL RELEASE Left 07/25/2014   Dr.Williamson    CARPAL TUNNEL RELEASE Right 04/08/2019   Procedure: RIGHT CARPAL TUNNEL RELEASE ENDOSCOPIC;  Surgeon: Rober Chimera, MD;  Location: [facility] OR;  Service: Orthopedics;  Laterality: Right;   CATARACT EXTRACTION W/ INTRAOCULAR LENS  IMPLANT, BILATERAL Bilateral    COLONIC STENT PLACEMENT N/A 08/31/2017   Procedure: COLONIC STENT PLACEMENT;  Surgeon: Alvis Jourdain, MD;  Location: [facility] ENDOSCOPY;  Service: Endoscopy;  Laterality: N/A;   COLONOSCOPY     2010-2011 Dr.Kipreos    COLOSTOMY  12/2016   Maximo Spar 01/26/2017   COLOSTOMY REVERSAL N/A 07/13/2017   Procedure: COLOSTOMY REVERSAL;  Surgeon: Jerryl Morin, MD;  [facility];  Service: General;  Laterality: N/A;   CYST REMOVAL HAND Right 05/2013   thumb   DILATION AND CURETTAGE OF UTERUS  X 5-6   ESOPHAGOGASTRODUODENOSCOPY (EGD) WITH PROPOFOL  N/A 03/13/2020   Procedure: ESOPHAGOGASTRODUODENOSCOPY (EGD) WITH PROPOFOL ;  Surgeon: Alvis Jourdain, MD;  Location: [facility] ENDOSCOPY;  Service: Endoscopy;  Laterality: N/A;   EXCISION MASS ABDOMINAL N/A 07/09/2018   Procedure: EXPLORATION OF  ABDOMINAL WOUND ERAS PATHWAY;  Surgeon: Jerryl Morin, MD;  Location: [facility] OR;  Service: General;  Laterality: N/A;   FLEXIBLE SIGMOIDOSCOPY N/A 08/24/2017   Procedure: Marlynn Singer;  Surgeon:  Alvis Jourdain, MD;  Location: [facility] ENDOSCOPY;  Service: Endoscopy;  Laterality: N/A;   FLEXIBLE SIGMOIDOSCOPY N/A 08/31/2017   Procedure: FLEXIBLE SIGMOIDOSCOPY;  Surgeon: Alvis Jourdain, MD;  Location: [facility] ENDOSCOPY;  Service: Endoscopy;  Laterality: N/A;  stent placement   FLEXIBLE SIGMOIDOSCOPY N/A 03/13/2020   Procedure: FLEXIBLE SIGMOIDOSCOPY;  Surgeon: Alvis Jourdain, MD;  Location: [facility] ENDOSCOPY;  Service: Endoscopy;  Laterality: N/A;   FLEXIBLE SIGMOIDOSCOPY N/A 12/18/2020   Procedure: FLEXIBLE SIGMOIDOSCOPY;  Surgeon: Nickey Barn,  Portia Brittle, MD;  Location: [facility] ENDOSCOPY;  Service: Endoscopy;  Laterality: N/A;   HEMOSTASIS CLIP PLACEMENT  03/13/2020   Procedure: HEMOSTASIS CLIP PLACEMENT;  Surgeon: Alvis Jourdain, MD;  Location: [facility] ENDOSCOPY;  Service: Endoscopy;;   HEMOSTASIS CLIP PLACEMENT  12/18/2020   Procedure: HEMOSTASIS CLIP PLACEMENT;  Surgeon: Alvis Jourdain, MD;  Location: [facility] ENDOSCOPY;  Service: Endoscopy;;   HOT HEMOSTASIS N/A 03/13/2020   Procedure: HOT HEMOSTASIS (ARGON PLASMA COAGULATION/BICAP);  Surgeon: Alvis Jourdain, MD;  Location: [facility] ENDOSCOPY;  Service: Endoscopy;  Laterality: N/A;   HOT HEMOSTASIS N/A 12/18/2020   Procedure: HOT HEMOSTASIS (ARGON PLASMA COAGULATION/BICAP);  Surgeon: Alvis Jourdain, MD;  Location: [facility] ENDOSCOPY;  Service: Endoscopy;  Laterality: N/A;   IMPLANTABLE CARDIOVERTER DEFIBRILLATOR GENERATOR CHANGE  2008   INSERTION OF MESH N/A 07/09/2018   Procedure: INSERTION OF VICRYL MESH;  Surgeon: Jerryl Morin, MD;  Location: [facility] OR;  Service: General;  Laterality: N/A;   LAPAROTOMY N/A 09/18/2017   Procedure: EXPLORATORY LAPAROTOMY;  Surgeon: Jerryl Morin, MD;  Location: [facility] OR;  Service: General;  Laterality: N/A;   LEAD REVISION  06/22/2018   LEAD REVISION/REPAIR N/A 06/22/2018   Procedure: LEAD REVISION/REPAIR;  Surgeon: Verona Goodwill, MD;  Location: [facility] INVASIVE CV LAB;  Service: Cardiovascular;  Laterality: N/A;   LYSIS OF ADHESION N/A 09/18/2017   Procedure: LYSIS OF  ADHESION;  Surgeon: Jerryl Morin, MD;  Location: [facility] OR;  Service: General;  Laterality: N/A;   LYSIS OF ADHESION N/A 07/09/2018   Procedure: LYSIS OF ADHESION;  Surgeon: Jerryl Morin, MD;  Location: [facility] OR;  Service: General;  Laterality: N/A;   PARTIAL COLECTOMY N/A 09/18/2017   Procedure: ILEOCOLECTOMY;  Surgeon: Jerryl Morin, MD;  Location: [facility] OR;  Service: General;  Laterality: N/A;   PARTIAL COLECTOMY  09/25/2017   POLYPECTOMY  03/13/2020   Procedure: POLYPECTOMY;  Surgeon: Alvis Jourdain, MD;  Location[facility] ENDOSCOPY;  Service: Endoscopy;;   RIGHT/LEFT HEART CATH AND CORONARY ANGIOGRAPHY N/A 06/01/2018   Procedure: RIGHT/LEFT HEART CATH AND CORONARY ANGIOGRAPHY;  Surgeon: Lucendia Rusk, MD;  Location: [facility] INVASIVE CV LAB;  Service: Cardiovascular;  Laterality: N/A;   SIGMOIDOSCOPY N/A 09/18/2017   Procedure: SIGMOIDOSCOPY;  Surgeon: Jerryl Morin, MD;  [facility] OR;  Service: General;  Laterality: N/A;   SMALL INTESTINE SURGERY  07/09/2018   EXPLORATION OF  ABDOMINAL WOUND ERAS PATHWAYN/; MESH; LYSIS OF ADHESIONS   TOTAL ABDOMINAL HYSTERECTOMY  1990   TRANSFORAMINAL LUMBAR INTERBODY FUSION (TLIF) WITH PEDICLE SCREW FIXATION 2 LEVEL N/A 01/07/2020   Procedure: Lumbar Four-Five and Lumbar Five-Sacral One open lumbar decompression and transforaminal lumbar interbody fusion;  Surgeon: Cannon Champion, MD;  Location: [facility] OR;  Service: Neurosurgery;  Laterality: N/A;  Lumbar Four-Five and Lumbar Five-Sacral One open lumbar decompression and transforaminal lumbar interbody fusion   TRIGGER FINGER RELEASE Right 05/213   TRIGGER FINGER RELEASE Right 04/08/2019   Procedure: RIGHT INDEX RELEASE TRIGGER FINGER/A-1 PULLEY;  Surgeon: Rober Chimera, MD;  Location: [facility] OR;  Service: Orthopedics;  Laterality: Right;   TUBAL LIGATION  1980s   VESICOVAGINAL FISTULA CLOSURE W/ TAH     Past Surgical History:  Procedure Laterality Date   APPENDECTOMY  07/13/2017   BLADDER SUSPENSION  1990    "w/hysterectomy"   BOWEL RESECTION N/A 07/09/2018   Procedure: SMALL BOWEL RESECTION;  Surgeon: Jerryl Morin, MD;  Location[facility];  Service: General;  Laterality: N/A;   CARDIAC CATHETERIZATION  2005   no obstructive CAD per patient   CARDIAC CATHETERIZATION  2018   CARDIAC DEFIBRILLATOR PLACEMENT  2005   BiV ICD implanted,  LV lead is an epicardial lead   CARPAL TUNNEL RELEASE Left 07/25/2014   Dr.Williamson    CARPAL TUNNEL RELEASE Right 04/08/2019   Procedure: RIGHT CARPAL TUNNEL RELEASE ENDOSCOPIC;  Surgeon: Rober Chimera, MD;  Location: [facility] OR;  Service: Orthopedics;  Laterality: Right;   CATARACT EXTRACTION W/ INTRAOCULAR LENS  IMPLANT, BILATERAL Bilateral    COLONIC STENT PLACEMENT N/A 08/31/2017   Procedure: COLONIC STENT PLACEMENT;  Surgeon: Alvis Jourdain, MD;  Location: [facility] ENDOSCOPY;  Service: Endoscopy;  Laterality: N/A;   COLONOSCOPY     2010-2011 Dr.Kipreos    COLOSTOMY  12/2016   Maximo Spar 01/26/2017   COLOSTOMY REVERSAL N/A 07/13/2017   Procedure: COLOSTOMY REVERSAL;  Surgeon: Jerryl Morin, MD;  [facility];  Service: General;  Laterality: N/A;   CYST REMOVAL HAND Right 05/2013   thumb   DILATION AND CURETTAGE OF UTERUS  X 5-6   ESOPHAGOGASTRODUODENOSCOPY (EGD) WITH PROPOFOL  N/A 03/13/2020   Procedure: ESOPHAGOGASTRODUODENOSCOPY (EGD) WITH PROPOFOL ;  Surgeon: Alvis Jourdain, MD;  Location[facility] ENDOSCOPY;  Service: Endoscopy;  Laterality: N/A;   EXCISION MASS ABDOMINAL N/A 07/09/2018   Procedure: EXPLORATION OF  ABDOMINAL WOUND ERAS PATHWAY;  Surgeon: Jerryl Morin, MD;  Location: [facility] OR;  Service: General;  Laterality: N/A;   FLEXIBLE SIGMOIDOSCOPY N/A 08/24/2017   Procedure: Marlynn Singer;  Surgeon: Alvis Jourdain, MD;  Location: [facility] ENDOSCOPY;  Service: Endoscopy;  Laterality: N/A;   FLEXIBLE SIGMOIDOSCOPY N/A 08/31/2017   Procedure: FLEXIBLE SIGMOIDOSCOPY;  Surgeon: Alvis Jourdain, MD;  Location: [facility] ENDOSCOPY;  Service: Endoscopy;  Laterality: N/A;  stent placement    FLEXIBLE SIGMOIDOSCOPY N/A 03/13/2020   Procedure: FLEXIBLE SIGMOIDOSCOPY;  Surgeon: Alvis Jourdain, MD;  Location[facility] ENDOSCOPY;  Service: Endoscopy;  Laterality: N/A;   FLEXIBLE SIGMOIDOSCOPY N/A 12/18/2020   Procedure: FLEXIBLE SIGMOIDOSCOPY;  Surgeon: Alvis Jourdain, MD;  Location: [facility] ENDOSCOPY;  Service: Endoscopy;  Laterality: N/A;   HEMOSTASIS CLIP PLACEMENT  03/13/2020   Procedure: HEMOSTASIS CLIP PLACEMENT;  Surgeon: Alvis Jourdain, MD;  Location: [facility] ENDOSCOPY;  Service: Endoscopy;;   HEMOSTASIS CLIP PLACEMENT  12/18/2020   Procedure: HEMOSTASIS CLIP PLACEMENT;  Surgeon: Alvis Jourdain, MD;  Location: [facility] ENDOSCOPY;  Service: Endoscopy;;   HOT HEMOSTASIS N/A 03/13/2020   Procedure: HOT HEMOSTASIS (ARGON PLASMA COAGULATION/BICAP);  Surgeon: Alvis Jourdain, MD;  Location: [facility] ENDOSCOPY;  Service: Endoscopy;  Laterality: N/A;   HOT HEMOSTASIS N/A 12/18/2020   Procedure: HOT HEMOSTASIS (ARGON PLASMA COAGULATION/BICAP);  Surgeon: Alvis Jourdain, MD;  Location: [facility] ENDOSCOPY;  Service: Endoscopy;  Laterality: N/A;   IMPLANTABLE CARDIOVERTER DEFIBRILLATOR GENERATOR CHANGE  2008   INSERTION OF MESH N/A 07/09/2018   Procedure: INSERTION OF VICRYL MESH;  Surgeon: Jerryl Morin, MD;  Location: [facility] OR;  Service: General;  Laterality: N/A;   LAPAROTOMY N/A 09/18/2017   Procedure: EXPLORATORY LAPAROTOMY;  Surgeon: Jerryl Morin, MD;  Location: [facility] OR;  Service: General;  Laterality: N/A;   LEAD REVISION  06/22/2018   LEAD REVISION/REPAIR N/A 06/22/2018   Procedure: LEAD REVISION/REPAIR;  Surgeon: Verona Goodwill, MD;  Location: [facility] INVASIVE CV LAB;  Service: Cardiovascular;  Laterality: N/A;   LYSIS OF ADHESION N/A 09/18/2017   Procedure: LYSIS OF ADHESION;  Surgeon: Jerryl Morin, MD;  Location: [facility] OR;  Service: General;  Laterality: N/A;   LYSIS OF ADHESION N/A 07/09/2018   Procedure: LYSIS OF ADHESION;  Surgeon: Jerryl Morin, MD;  Location: [facility] OR;  Service: General;  Laterality: N/A;  PARTIAL COLECTOMY N/A 09/18/2017    Procedure: ILEOCOLECTOMY;  Surgeon: Jerryl Morin, MD;  Location: [facility] OR;  Service: General;  Laterality: N/A;   PARTIAL COLECTOMY  09/25/2017   POLYPECTOMY  03/13/2020   Procedure: POLYPECTOMY;  Surgeon: Alvis Jourdain, MD;  Location: [facility] ENDOSCOPY;  Service: Endoscopy;;   RIGHT/LEFT HEART CATH AND CORONARY ANGIOGRAPHY N/A 06/01/2018   Procedure: RIGHT/LEFT HEART CATH AND CORONARY ANGIOGRAPHY;  Surgeon: Lucendia Rusk, MD;  Location: [facility] INVASIVE CV LAB;  Service: Cardiovascular;  Laterality: N/A;   SIGMOIDOSCOPY N/A 09/18/2017   Procedure: SIGMOIDOSCOPY;  Surgeon: Jerryl Morin, MD;  Location[facility] OR;  Service: General;  Laterality: N/A;   SMALL INTESTINE SURGERY  07/09/2018   EXPLORATION OF  ABDOMINAL WOUND ERAS PATHWAYN/; MESH; LYSIS OF ADHESIONS   TOTAL ABDOMINAL HYSTERECTOMY  1990   TRANSFORAMINAL LUMBAR INTERBODY FUSION (TLIF) WITH PEDICLE SCREW FIXATION 2 LEVEL N/A 01/07/2020   Procedure: Lumbar Four-Five and Lumbar Five-Sacral One open lumbar decompression and transforaminal lumbar interbody fusion;  Surgeon: Cannon Champion, MD;  Location: [facility] OR;  Service: Neurosurgery;  Laterality: N/A;  Lumbar Four-Five and Lumbar Five-Sacral One open lumbar decompression and transforaminal lumbar interbody fusion   TRIGGER FINGER RELEASE Right 05/213   TRIGGER FINGER RELEASE Right 04/08/2019   Procedure: RIGHT INDEX RELEASE TRIGGER FINGER/A-1 PULLEY;  Surgeon: Rober Chimera, MD;  Location: [facility] OR;  Service: Orthopedics;  Laterality: Right;   TUBAL LIGATION  1980s   VESICOVAGINAL FISTULA CLOSURE W/ TAH     Past Medical History:  Diagnosis Date   AICD (automatic cardioverter/defibrillator) present    high RV threshold chronically, device was turned off in 2014; Device battery has been dead x 7 years; "turned it back on 06/22/2018"   Anemia, iron  deficiency    Angioedema    felt to likely be due to ace inhibitors but says she has had this even off of medicines, appears to be tolerating ARBs  chronically   Anxiety    Arthritis    "hands, legs, arms; bad in my back" (06/22/2018)   Asthmatic bronchitis with status asthmaticus    Bradycardia    CHF (congestive heart failure) (HCC)    Chronic lower back pain    Chronic pain    CKD (chronic kidney disease), stage III (HCC)    Coronary artery disease    Mild nonobstructive (30% LAD, 30% RCA) by 09/01/16 cath at [facility]   Degenerative joint disease    Depression    Diabetes mellitus, type 2 (HCC)    Diabetic peripheral neuropathy (HCC)    Dizziness    Dyspnea on exertion    Family history of adverse reaction to anesthesia    Mother has nausea   GERD (gastroesophageal reflux disease)    Gout    Heart valve disorder    History of blood transfusion 1981; ~ 2005; 12/2016   "childbirth; defibrillator OR; colostomy OR"   History of mononucleosis 03/2014   Hyperlipidemia    Hypertension    Hypothyroid    Insomnia    Intervertebral disc degeneration    Ischemic cardiomyopathy    LBBB (left bundle branch block)    Myocardial infarction (HCC) dx'd ~ 2005   Nonischemic cardiomyopathy (HCC)    s/p ICD in 2005. EF has since recovered   Obesity    On home oxygen  therapy    "2L at night" (06/22/2018)   OSA on CPAP    Pneumonia    "couple times; last time was 12/2016" (06/22/2018)   PONV (  postoperative nausea and vomiting)    Presence of permanent cardiac pacemaker    [employer]   Swelling    Syncope    Systemic hypertension    Vitamin D  deficiency    There were no vitals taken for this visit.  Opioid Risk Score:   Fall Risk Score:  `1  Depression screen [facility] 2/9     11/06/2023   10:53 AM 09/11/2023   10:15 AM 08/10/2023   10:54 AM 07/10/2023    9:59 AM 06/15/2023   11:10 AM 03/29/2023   11:15 AM 02/23/2023   10:54 AM  Depression screen [facility] 2/9  Decreased Interest 0 0 0 0 0 0 0  Down, Depressed, Hopeless 0 0 0 0 0 0 0  PHQ - 2 Score 0 0 0 0 0 0 0    Review of Systems     Objective:   Physical  Exam        Assessment & Plan:

## 2023-12-05 ENCOUNTER — Encounter: Admitting: Registered Nurse

## 2023-12-11 ENCOUNTER — Encounter: Attending: Registered Nurse | Admitting: Registered Nurse

## 2023-12-11 ENCOUNTER — Encounter: Payer: Self-pay | Admitting: Registered Nurse

## 2023-12-11 VITALS — BP 123/66 | HR 77 | Ht 64.0 in | Wt 246.0 lb

## 2023-12-11 DIAGNOSIS — M170 Bilateral primary osteoarthritis of knee: Secondary | ICD-10-CM | POA: Diagnosis present

## 2023-12-11 DIAGNOSIS — G8929 Other chronic pain: Secondary | ICD-10-CM | POA: Insufficient documentation

## 2023-12-11 DIAGNOSIS — M545 Low back pain, unspecified: Secondary | ICD-10-CM | POA: Insufficient documentation

## 2023-12-11 DIAGNOSIS — Z5181 Encounter for therapeutic drug level monitoring: Secondary | ICD-10-CM | POA: Diagnosis present

## 2023-12-11 DIAGNOSIS — G894 Chronic pain syndrome: Secondary | ICD-10-CM | POA: Insufficient documentation

## 2023-12-11 DIAGNOSIS — Z79891 Long term (current) use of opiate analgesic: Secondary | ICD-10-CM | POA: Diagnosis present

## 2023-12-11 NOTE — Patient Instructions (Signed)
 Send a My Chart message with update on Medication.

## 2023-12-11 NOTE — Progress Notes (Signed)
 Subjective:    Patient ID: Crystal [name], female    DOB: [birth year], 73 y.o.   MRN: [id number]  HPI: Crystal [name] is a 73 y.o. female who returns for follow up appointment for chronic pain and medication refill. She states her pain is located in her lower back and bilateral knee R>L. She rates her pain 7.Her  current exercise regime is walking with walker  and performing stretching exercises.  Crystal [name] Morphine  equivalent is 45.00 MME.   Oral Swab was Performed today.      Pain Inventory Average Pain 9 Pain Right Now 7 My pain is constant, sharp, burning, stabbing, and aching  In the last 24 hours, has pain interfered with the following? General activity 8 Relation with others 0 Enjoyment of life 5 What TIME of day is your pain at its worst? morning , evening, and night Sleep (in general) Good  Pain is worse with: walking, bending, standing, and some activites Pain improves with: rest, therapy/exercise, and medication Relief from Meds: 8  Family History  Problem Relation Age of Onset   Hypertension Father    Heart disease Father    Cancer Father        prostate   Heart disease Other        (Maternal side) Ischemic heart disease   Diabetes Mellitus II Other    Arrhythmia Mother    Diabetes Mellitus II Mother        Borderline DM   Hypertension Mother    Asthma Mother    Cancer Brother    Dementia Brother    Hypertension Brother    Hypertension Daughter    Hypertension Daughter    Diabetes Daughter    Hypertension Daughter    Atrial fibrillation Daughter    GER disease Daughter    Hypertension Son    Anxiety disorder Son    Hypothyroidism Son    Breast cancer Maternal Grandmother        in her 14's   Social History   Socioeconomic History   Marital status: Widowed    Spouse name: Not on file   Number of children: Not on file   Years of education: Not on file   Highest education level: Not on file  Occupational History    Comment: retired LPN   Tobacco Use   Smoking status: Never   Smokeless tobacco: Never  Vaping Use   Vaping status: Never Used  Substance and Sexual Activity   Alcohol  use: Not Currently   Drug use: Never   Sexual activity: Not Currently  Other Topics Concern   Not on file  Social History Narrative   [place] in [place].  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.      As of 03/15/17:   Diet: 1800 Calorie      Caffeine: Yes      Married, if yes what year: Widowed, married 1973      Do you live in a house, apartment, assisted living, condo, trailer, ect: House, 1 stories, and 1 person      Pets: No      Current/Past profession: LPN, retired       Exercise: Yes, walking          Living Will: Yes   DNR: No   POA/HPOA: Yes      Functional Status:   Do you have difficulty bathing or dressing yourself? No  Do you have difficulty preparing food or eating? No   Do you have difficulty managing your medications? No   Do you have difficulty managing your finances? No   Do you have difficulty affording your medications? Yes   Social Drivers of Corporate investment banker Strain: Low Risk  (10/11/2017)   Overall Financial Resource Strain (CARDIA)    Difficulty of Paying Living Expenses: Not hard at all  Food Insecurity: No Food Insecurity (07/06/2023)   Hunger Vital Sign    Worried About Running Out of Food in the Last Year: Never true    Ran Out of Food in the Last Year: Never true  Transportation Needs: No Transportation Needs (07/06/2023)   PRAPARE - Administrator, Civil Service (Medical): No    Lack of Transportation (Non-Medical): No  Physical Activity: Inactive (10/11/2017)   Exercise Vital Sign    Days of Exercise per Week: 0 days    Minutes of Exercise per Session: 0 min  Stress: Stress Concern Present (10/11/2017)   [name] - Occupational Stress Questionnaire    Feeling of Stress : To some  extent  Social Connections: Somewhat Isolated (10/11/2017)   Social Connection and Isolation Panel [NHANES]    Frequency of Communication with Friends and Family: More than three times a week    Frequency of Social Gatherings with Friends and Family: More than three times a week    Attends Religious Services: More than 4 times per year    Active Member of [employer] or Organizations: No    Attends Banker Meetings: Never    Marital Status: Widowed   Past Surgical History:  Procedure Laterality Date   APPENDECTOMY  07/13/2017   BLADDER SUSPENSION  1990   "w/hysterectomy"   BOWEL RESECTION N/A 07/09/2018   Procedure: SMALL BOWEL RESECTION;  Surgeon: Crystal Morin, MD;  [facility];  Service: General;  Laterality: N/A;   CARDIAC CATHETERIZATION  2005   no obstructive CAD per patient   CARDIAC CATHETERIZATION  2018   CARDIAC DEFIBRILLATOR PLACEMENT  2005   BiV ICD implanted,  LV lead is an epicardial lead   CARPAL TUNNEL RELEASE Left 07/25/2014   Dr.Williamson    CARPAL TUNNEL RELEASE Right 04/08/2019   Procedure: RIGHT CARPAL TUNNEL RELEASE ENDOSCOPIC;  Surgeon: Crystal Chimera, MD;  Location: [facility] OR;  Service: Orthopedics;  Laterality: Right;   CATARACT EXTRACTION W/ INTRAOCULAR LENS  IMPLANT, BILATERAL Bilateral    COLONIC STENT PLACEMENT N/A 08/31/2017   Procedure: COLONIC STENT PLACEMENT;  Surgeon: Alvis Jourdain, MD;  Location: [facility] ENDOSCOPY;  Service: Endoscopy;  Laterality: N/A;   COLONOSCOPY     2010-2011 Dr.Kipreos    COLOSTOMY  12/2016   Crystal [name] 01/26/2017   COLOSTOMY REVERSAL N/A 07/13/2017   Procedure: COLOSTOMY REVERSAL;  Surgeon: Crystal Morin, MD;  [facility];  Service: General;  Laterality: N/A;   CYST REMOVAL HAND Right 05/2013   thumb   DILATION AND CURETTAGE OF UTERUS  X 5-6   ESOPHAGOGASTRODUODENOSCOPY (EGD) WITH PROPOFOL  N/A 03/13/2020   Procedure: ESOPHAGOGASTRODUODENOSCOPY (EGD) WITH PROPOFOL ;  Surgeon: Alvis Jourdain, MD;  Location: [facility] ENDOSCOPY;   Service: Endoscopy;  Laterality: N/A;   EXCISION MASS ABDOMINAL N/A 07/09/2018   Procedure: EXPLORATION OF  ABDOMINAL WOUND ERAS PATHWAY;  Surgeon: Crystal Morin, MD;  Location: [facility] OR;  Service: General;  Laterality: N/A;   FLEXIBLE SIGMOIDOSCOPY N/A 08/24/2017   Procedure: Marlynn Singer;  Surgeon: Alvis Jourdain, MD;  Location: [facility] ENDOSCOPY;  Service: Endoscopy;  Laterality: N/A;   FLEXIBLE SIGMOIDOSCOPY N/A 08/31/2017   Procedure: FLEXIBLE SIGMOIDOSCOPY;  Surgeon: Alvis Jourdain, MD;  Location: [facility] ENDOSCOPY;  Service: Endoscopy;  Laterality: N/A;  stent placement   FLEXIBLE SIGMOIDOSCOPY N/A 03/13/2020   Procedure: FLEXIBLE SIGMOIDOSCOPY;  Surgeon: Alvis Jourdain, MD;  [facility] ENDOSCOPY;  Service: Endoscopy;  Laterality: N/A;   FLEXIBLE SIGMOIDOSCOPY N/A 12/18/2020   Procedure: FLEXIBLE SIGMOIDOSCOPY;  Surgeon: Alvis Jourdain, MD;  [facility] ENDOSCOPY;  Service: Endoscopy;  Laterality: N/A;   HEMOSTASIS CLIP PLACEMENT  03/13/2020   Procedure: HEMOSTASIS CLIP PLACEMENT;  Surgeon: Alvis Jourdain, MD;  [facility] ENDOSCOPY;  Service: Endoscopy;;   HEMOSTASIS CLIP PLACEMENT  12/18/2020   Procedure: HEMOSTASIS CLIP PLACEMENT;  Surgeon: Alvis Jourdain, MD;  [facility] ENDOSCOPY;  Service: Endoscopy;;   HOT HEMOSTASIS N/A 03/13/2020   Procedure: HOT HEMOSTASIS (ARGON PLASMA COAGULATION/BICAP);  Surgeon: Alvis Jourdain, MD;  Location: [facility] ENDOSCOPY;  Service: Endoscopy;  Laterality: N/A;   HOT HEMOSTASIS N/A 12/18/2020   Procedure: HOT HEMOSTASIS (ARGON PLASMA COAGULATION/BICAP);  Surgeon: Alvis Jourdain, MD;  Location: [facility] ENDOSCOPY;  Service: Endoscopy;  Laterality: N/A;   IMPLANTABLE CARDIOVERTER DEFIBRILLATOR GENERATOR CHANGE  2008   INSERTION OF MESH N/A 07/09/2018   Procedure: INSERTION OF VICRYL MESH;  Surgeon: Crystal Morin, MD;  [facility];  Service: General;  Laterality: N/A;   LAPAROTOMY N/A 09/18/2017   Procedure: EXPLORATORY LAPAROTOMY;  Surgeon: Crystal Morin, MD;  Location: [facility] OR;   Service: General;  Laterality: N/A;   LEAD REVISION  06/22/2018   LEAD REVISION/REPAIR N/A 06/22/2018   Procedure: LEAD REVISION/REPAIR;  Surgeon: Verona Goodwill, MD;  [facility] INVASIVE CV LAB;  Service: Cardiovascular;  Laterality: N/A;   LYSIS OF ADHESION N/A 09/18/2017   Procedure: LYSIS OF ADHESION;  Surgeon: Crystal Morin, MD;  Location: [facility] OR;  Service: General;  Laterality: N/A;   LYSIS OF ADHESION N/A 07/09/2018   Procedure: LYSIS OF ADHESION;  Surgeon: Crystal Morin, MD;  Location: [facility] OR;  Service: General;  Laterality: N/A;   PARTIAL COLECTOMY N/A 09/18/2017   Procedure: ILEOCOLECTOMY;  Surgeon: Crystal Morin, MD;  Location: [facility] OR;  Service: General;  Laterality: N/A;   PARTIAL COLECTOMY  09/25/2017   POLYPECTOMY  03/13/2020   Procedure: POLYPECTOMY;  Surgeon: Alvis Jourdain, MD;  [facility] ENDOSCOPY;  Service: Endoscopy;;   RIGHT/LEFT HEART CATH AND CORONARY ANGIOGRAPHY N/A 06/01/2018   Procedure: RIGHT/LEFT HEART CATH AND CORONARY ANGIOGRAPHY;  Surgeon: Lucendia Rusk, MD;  Location: [facility] INVASIVE CV LAB;  Service: Cardiovascular;  Laterality: N/A;   SIGMOIDOSCOPY N/A 09/18/2017   Procedure: SIGMOIDOSCOPY;  Surgeon: Crystal Morin, MD;  [facility] OR;  Service: General;  Laterality: N/A;   SMALL INTESTINE SURGERY  07/09/2018   EXPLORATION OF  ABDOMINAL WOUND ERAS PATHWAYN/; MESH; LYSIS OF ADHESIONS   TOTAL ABDOMINAL HYSTERECTOMY  1990   TRANSFORAMINAL LUMBAR INTERBODY FUSION (TLIF) WITH PEDICLE SCREW FIXATION 2 LEVEL N/A 01/07/2020   Procedure: Lumbar Four-Five and Lumbar Five-Sacral One open lumbar decompression and transforaminal lumbar interbody fusion;  Surgeon: Cannon Champion, MD;  [facility] OR;  Service: Neurosurgery;  Laterality: N/A;  Lumbar Four-Five and Lumbar Five-Sacral One open lumbar decompression and transforaminal lumbar interbody fusion   TRIGGER FINGER RELEASE Right 05/213   TRIGGER FINGER RELEASE Right 04/08/2019   Procedure: RIGHT INDEX RELEASE  TRIGGER FINGER/A-1 PULLEY;  Surgeon: Crystal Chimera, MD;  Location: [facility] OR;  Service: Orthopedics;  Laterality: Right;   TUBAL LIGATION  1980s   VESICOVAGINAL  FISTULA CLOSURE W/ TAH     Past Surgical History:  Procedure Laterality Date   APPENDECTOMY  07/13/2017   BLADDER SUSPENSION  1990   "w/hysterectomy"   BOWEL RESECTION N/A 07/09/2018   Procedure: SMALL BOWEL RESECTION;  Surgeon: Crystal Morin, MD;  [facility];  Service: General;  Laterality: N/A;   CARDIAC CATHETERIZATION  2005   no obstructive CAD per patient   CARDIAC CATHETERIZATION  2018   CARDIAC DEFIBRILLATOR PLACEMENT  2005   BiV ICD implanted,  LV lead is an epicardial lead   CARPAL TUNNEL RELEASE Left 07/25/2014   Dr.Williamson    CARPAL TUNNEL RELEASE Right 04/08/2019   Procedure: RIGHT CARPAL TUNNEL RELEASE ENDOSCOPIC;  Surgeon: Crystal Chimera, MD;  Location: [facility] OR;  Service: Orthopedics;  Laterality: Right;   CATARACT EXTRACTION W/ INTRAOCULAR LENS  IMPLANT, BILATERAL Bilateral    COLONIC STENT PLACEMENT N/A 08/31/2017   Procedure: COLONIC STENT PLACEMENT;  Surgeon: Alvis Jourdain, MD;  Location: [facility] ENDOSCOPY;  Service: Endoscopy;  Laterality: N/A;   COLONOSCOPY     2010-2011 Dr.Kipreos    COLOSTOMY  12/2016   Crystal [name] 01/26/2017   COLOSTOMY REVERSAL N/A 07/13/2017   Procedure: COLOSTOMY REVERSAL;  Surgeon: Crystal Morin, MD;  [facility];  Service: General;  Laterality: N/A;   CYST REMOVAL HAND Right 05/2013   thumb   DILATION AND CURETTAGE OF UTERUS  X 5-6   ESOPHAGOGASTRODUODENOSCOPY (EGD) WITH PROPOFOL  N/A 03/13/2020   Procedure: ESOPHAGOGASTRODUODENOSCOPY (EGD) WITH PROPOFOL ;  Surgeon: Alvis Jourdain, MD;  [facility] ENDOSCOPY;  Service: Endoscopy;  Laterality: N/A;   EXCISION MASS ABDOMINAL N/A 07/09/2018   Procedure: EXPLORATION OF  ABDOMINAL WOUND ERAS PATHWAY;  Surgeon: Crystal Morin, MD;  Location: [facility] OR;  Service: General;  Laterality: N/A;   FLEXIBLE SIGMOIDOSCOPY N/A 08/24/2017   Procedure: Marlynn Singer;  Surgeon: Alvis Jourdain, MD;  Location: [facility] ENDOSCOPY;  Service: Endoscopy;  Laterality: N/A;   FLEXIBLE SIGMOIDOSCOPY N/A 08/31/2017   Procedure: FLEXIBLE SIGMOIDOSCOPY;  Surgeon: Alvis Jourdain, MD;  Location: [facility] ENDOSCOPY;  Service: Endoscopy;  Laterality: N/A;  stent placement   FLEXIBLE SIGMOIDOSCOPY N/A 03/13/2020   Procedure: FLEXIBLE SIGMOIDOSCOPY;  Surgeon: Alvis Jourdain, MD;  [facility] ENDOSCOPY;  Service: Endoscopy;  Laterality: N/A;   FLEXIBLE SIGMOIDOSCOPY N/A 12/18/2020   Procedure: FLEXIBLE SIGMOIDOSCOPY;  Surgeon: Alvis Jourdain, MD;  [facility] ENDOSCOPY;  Service: Endoscopy;  Laterality: N/A;   HEMOSTASIS CLIP PLACEMENT  03/13/2020   Procedure: HEMOSTASIS CLIP PLACEMENT;  Surgeon: Alvis Jourdain, MD;  Location: [facility] ENDOSCOPY;  Service: Endoscopy;;   HEMOSTASIS CLIP PLACEMENT  12/18/2020   Procedure: HEMOSTASIS CLIP PLACEMENT;  Surgeon: Alvis Jourdain, MD;  Location[facility] ENDOSCOPY;  Service: Endoscopy;;   HOT HEMOSTASIS N/A 03/13/2020   Procedure: HOT HEMOSTASIS (ARGON PLASMA COAGULATION/BICAP);  Surgeon: Alvis Jourdain, MD;  Location: [facility] ENDOSCOPY;  Service: Endoscopy;  Laterality: N/A;   HOT HEMOSTASIS N/A 12/18/2020   Procedure: HOT HEMOSTASIS (ARGON PLASMA COAGULATION/BICAP);  Surgeon: Alvis Jourdain, MD;  Location: [facility] ENDOSCOPY;  Service: Endoscopy;  Laterality: N/A;   IMPLANTABLE CARDIOVERTER DEFIBRILLATOR GENERATOR CHANGE  2008   INSERTION OF MESH N/A 07/09/2018   Procedure: INSERTION OF VICRYL MESH;  Surgeon: Crystal Morin, MD;  Location: [facility] OR;  Service: General;  Laterality: N/A;   LAPAROTOMY N/A 09/18/2017   Procedure: EXPLORATORY LAPAROTOMY;  Surgeon: Crystal Morin, MD;  Location: [facility] OR;  Service: General;  Laterality: N/A;   LEAD REVISION  06/22/2018   LEAD REVISION/REPAIR N/A 06/22/2018   Procedure: LEAD REVISION/REPAIR;  Surgeon:  Verona Goodwill, MD;  Location: [facility] INVASIVE CV LAB;  Service: Cardiovascular;  Laterality: N/A;   LYSIS OF ADHESION N/A 09/18/2017    Procedure: LYSIS OF ADHESION;  Surgeon: Crystal Morin, MD;  Location: [facility] OR;  Service: General;  Laterality: N/A;   LYSIS OF ADHESION N/A 07/09/2018   Procedure: LYSIS OF ADHESION;  Surgeon: Crystal Morin, MD;  Location: [facility] OR;  Service: General;  Laterality: N/A;   PARTIAL COLECTOMY N/A 09/18/2017   Procedure: ILEOCOLECTOMY;  Surgeon: Crystal Morin, MD;  Location: [facility] OR;  Service: General;  Laterality: N/A;   PARTIAL COLECTOMY  09/25/2017   POLYPECTOMY  03/13/2020   Procedure: POLYPECTOMY;  Surgeon: Alvis Jourdain, MD;  Location[facility] ENDOSCOPY;  Service: Endoscopy;;   RIGHT/LEFT HEART CATH AND CORONARY ANGIOGRAPHY N/A 06/01/2018   Procedure: RIGHT/LEFT HEART CATH AND CORONARY ANGIOGRAPHY;  Surgeon: Lucendia Rusk, MD;  Location: [facility] INVASIVE CV LAB;  Service: Cardiovascular;  Laterality: N/A;   SIGMOIDOSCOPY N/A 09/18/2017   Procedure: SIGMOIDOSCOPY;  Surgeon: Crystal Morin, MD;  [facility] OR;  Service: General;  Laterality: N/A;   SMALL INTESTINE SURGERY  07/09/2018   EXPLORATION OF  ABDOMINAL WOUND ERAS PATHWAYN/; MESH; LYSIS OF ADHESIONS   TOTAL ABDOMINAL HYSTERECTOMY  1990   TRANSFORAMINAL LUMBAR INTERBODY FUSION (TLIF) WITH PEDICLE SCREW FIXATION 2 LEVEL N/A 01/07/2020   Procedure: Lumbar Four-Five and Lumbar Five-Sacral One open lumbar decompression and transforaminal lumbar interbody fusion;  Surgeon: Cannon Champion, MD;  Location: [facility] OR;  Service: Neurosurgery;  Laterality: N/A;  Lumbar Four-Five and Lumbar Five-Sacral One open lumbar decompression and transforaminal lumbar interbody fusion   TRIGGER FINGER RELEASE Right 05/213   TRIGGER FINGER RELEASE Right 04/08/2019   Procedure: RIGHT INDEX RELEASE TRIGGER FINGER/A-1 PULLEY;  Surgeon: Crystal Chimera, MD;  Location: [name] OR;  Service: Orthopedics;  Laterality: Right;   TUBAL LIGATION  1980s   VESICOVAGINAL FISTULA CLOSURE W/ TAH     Past Medical History:  Diagnosis Date   AICD (automatic cardioverter/defibrillator) present     high RV threshold chronically, device was turned off in 2014; Device battery has been dead x 7 years; "turned it back on 06/22/2018"   Anemia, iron  deficiency    Angioedema    felt to likely be due to ace inhibitors but says she has had this even off of medicines, appears to be tolerating ARBs chronically   Anxiety    Arthritis    "hands, legs, arms; bad in my back" (06/22/2018)   Asthmatic bronchitis with status asthmaticus    Bradycardia    CHF (congestive heart failure) (HCC)    Chronic lower back pain    Chronic pain    CKD (chronic kidney disease), stage III (HCC)    Coronary artery disease    Mild nonobstructive (30% LAD, 30% RCA) by 09/01/16 cath at [facility]   Degenerative joint disease    Depression    Diabetes mellitus, type 2 (HCC)    Diabetic peripheral neuropathy (HCC)    Dizziness    Dyspnea on exertion    Family history of adverse reaction to anesthesia    Mother has nausea   GERD (gastroesophageal reflux disease)    Gout    Heart valve disorder    History of blood transfusion 1981; ~ 2005; 12/2016   "childbirth; defibrillator OR; colostomy OR"   History of mononucleosis 03/2014   Hyperlipidemia    Hypertension    Hypothyroid    Insomnia    Intervertebral disc degeneration  Ischemic cardiomyopathy    LBBB (left bundle branch block)    Myocardial infarction [facility]) dx'd ~ 2005   Nonischemic cardiomyopathy (HCC)    s/p ICD in 2005. EF has since recovered   Obesity    On home oxygen  therapy    "2L at night" (06/22/2018)   OSA on CPAP    Pneumonia    "couple times; last time was 12/2016" (06/22/2018)   PONV (postoperative nausea and vomiting)    Presence of permanent cardiac pacemaker    [employer]   Swelling    Syncope    Systemic hypertension    Vitamin D  deficiency    Wt 246 lb (111.6 kg)   BMI 42.23 kg/m   Opioid Risk Score:   Fall Risk Score:  `1  Depression screen [facility] 2/9     11/06/2023   10:53 AM 09/11/2023   10:15 AM  08/10/2023   10:54 AM 07/10/2023    9:59 AM 06/15/2023   11:10 AM 03/29/2023   11:15 AM 02/23/2023   10:54 AM  Depression screen PHQ 2/9  Decreased Interest 0 0 0 0 0 0 0  Down, Depressed, Hopeless 0 0 0 0 0 0 0  PHQ - 2 Score 0 0 0 0 0 0 0    Review of Systems  Musculoskeletal:  Positive for back pain and gait problem.       Right Pain Pain in both knees  All other systems reviewed and are negative.      Objective:   Physical Exam Vitals and nursing note reviewed.  Constitutional:      Appearance: Normal appearance. She is obese.  Cardiovascular:     Rate and Rhythm: Normal rate and regular rhythm.     Pulses: Normal pulses.     Heart sounds: Normal heart sounds.  Pulmonary:     Effort: Pulmonary effort is normal.     Breath sounds: Normal breath sounds.  Musculoskeletal:     Comments: Normal Muscle Bulk and Muscle Testing Reveals:  Upper Extremities: Full ROM and Muscle Strength 5/5 Lumbar Paraspinal Tenderness: L-4-L-5 Lower Extremities: Full ROM and Muscle Strength 5/5 Bilateral Lower Extremities Flexion Produces Pain into her Bilateral Patella's Arises from Table Slowly using walker for support Narrow Based  Gait     Skin:    General: Skin is warm and dry.  Neurological:     Mental Status: She is alert and oriented to person, place, and time.  Psychiatric:        Mood and Affect: Mood normal.        Behavior: Behavior normal.         Assessment & Plan:  Chronic Low Back Pain: Encouraged to increase HEP as Tolerated. Continue to Monitor. 12/11/2023 2. Bilateral Primary Osteoarthritis:  S/P Right Knee Genicular nerve radiofrequency neurotomy, with Dr Sharl Davies. On 08/02/2022 and S/P Left Knee Genicular Nerve Radiofrequency on 08/16/2022. With good relief noted.  : Indication Chronic post operative pain in the Knee, pain postop total knee replacement which has not responded to conservative management such as physical therapy and medication management; Continue  monitor. 12/11/2023 3. Myofascial Pain: Continue HEP as Tolerated. Continue to Monitor.  12/11/2023 4. Chronic Pain Syndrome: Continue  Oxycodone  7.5mg  /325 mg, changed to Oxycodone  10 mg three times a day as needed for pain, #90, due to Cost of medication.  Discontinue Hydrocodone  due to itching,  We will continue the opioid monitoring program, this consists of regular clinic visits, examinations, urine drug screen, pill  counts as well as use of [place]  Controlled Substance Reporting system. A 12 month History has been reviewed on the [place]  Controlled Substance Reporting System on 12/11/2023. 5. Polyarthralgia: Continue HEP as tolerated. Continue current medication regimen. Continue to monitor. 12/11/2023 6. Chronic Bilateral Thoracic - Back Pain: no complaints today. Continue HEP as Tolerated. Continue to Monitor. 12/11/2023.    F/U in 1 month

## 2023-12-13 ENCOUNTER — Encounter: Payer: Self-pay | Admitting: Cardiology

## 2023-12-13 ENCOUNTER — Encounter: Payer: Self-pay | Admitting: Internal Medicine

## 2023-12-13 MED ORDER — OXYCODONE HCL 10 MG PO TABS: 10.0000 mg | ORAL_TABLET | Freq: Three times a day (TID) | ORAL | 0 refills | Status: DC | PRN

## 2023-12-15 LAB — DRUG TOX MONITOR 1 W/CONF, ORAL FLD
Amphetamines: NEGATIVE ng/mL (ref <10)
Barbiturates: NEGATIVE ng/mL (ref <10)
Benzodiazepines: NEGATIVE ng/mL (ref <0.50)
Buprenorphine: NEGATIVE ng/mL (ref <0.10)
Cocaine: NEGATIVE ng/mL (ref <5.0)
Codeine: NEGATIVE ng/mL (ref <2.5)
Dihydrocodeine: NEGATIVE ng/mL (ref <2.5)
Fentanyl: NEGATIVE ng/mL (ref <0.10)
Heroin Metabolite: NEGATIVE ng/mL (ref <1.0)
Hydrocodone: NEGATIVE ng/mL (ref <2.5)
Hydromorphone: NEGATIVE ng/mL (ref <2.5)
MARIJUANA: NEGATIVE ng/mL (ref <2.5)
MDMA: NEGATIVE ng/mL (ref <10)
Meprobamate: NEGATIVE ng/mL (ref <2.5)
Methadone: NEGATIVE ng/mL (ref <5.0)
Morphine: NEGATIVE ng/mL (ref <2.5)
Nicotine Metabolite: NEGATIVE ng/mL (ref <5.0)
Norhydrocodone: NEGATIVE ng/mL (ref <2.5)
Noroxycodone: 4.5 ng/mL — ABNORMAL HIGH (ref <2.5)
Opiates: POSITIVE ng/mL — AB (ref <2.5)
Oxycodone: 171 ng/mL — ABNORMAL HIGH (ref <2.5)
Oxymorphone: NEGATIVE ng/mL (ref <2.5)
Phencyclidine: NEGATIVE ng/mL (ref <10)
Tapentadol: NEGATIVE ng/mL (ref <5.0)
Tramadol: NEGATIVE ng/mL (ref <5.0)
Zolpidem: NEGATIVE ng/mL (ref <5.0)

## 2023-12-15 LAB — DRUG TOX ALC METAB W/CON, ORAL FLD: Alcohol Metabolite: NEGATIVE ng/mL (ref <25)

## 2023-12-22 ENCOUNTER — Ambulatory Visit (INDEPENDENT_AMBULATORY_CARE_PROVIDER_SITE_OTHER): Payer: Medicare Other

## 2023-12-22 DIAGNOSIS — I447 Left bundle-branch block, unspecified: Secondary | ICD-10-CM

## 2023-12-22 LAB — CUP PACEART REMOTE DEVICE CHECK
Battery Remaining Longevity: 36 mo
Battery Remaining Percentage: 44 %
Brady Statistic RA Percent Paced: 0 %
Brady Statistic RV Percent Paced: 97 %
Date Time Interrogation Session: 20250530020100
HighPow Impedance: 77 Ohm
Lead Channel Impedance Value: 473 Ohm
Lead Channel Impedance Value: 522 Ohm
Lead Channel Impedance Value: 836 Ohm
Lead Channel Pacing Threshold Amplitude: 0.9 V
Lead Channel Pacing Threshold Pulse Width: 0.4 ms
Lead Channel Setting Pacing Amplitude: 2 V
Lead Channel Setting Pacing Amplitude: 2.5 V
Lead Channel Setting Pacing Amplitude: 4 V
Lead Channel Setting Pacing Pulse Width: 0.4 ms
Lead Channel Setting Pacing Pulse Width: 1 ms
Lead Channel Setting Sensing Sensitivity: 0.5 mV
Lead Channel Setting Sensing Sensitivity: 1 mV
Pulse Gen Serial Number: 227627

## 2024-01-05 NOTE — Progress Notes (Unsigned)
 Subjective:    Patient ID: Crystal [name], female    DOB: [birth year], 73 y.o.   MRN: [id number]  HPI: [name] is a 73 y.o. female who returns for follow up appointment for chronic pain and medication refill. She states her pain is located in her lower back and bilateral knee pain. She rates her pain 7. Her current exercise regime is walking and performing stretching exercises.  Ms. [place] Morphine  equivalent is 45.00 MME.   Last Oral Swab was Performed on 12/11/2023, it was consistent.   Last Oral Swab was Performed      Pain Inventory Average Pain 9 Pain Right Now 7 My pain is constant, sharp, burning, stabbing, and aching  In the last 24 hours, has pain interfered with the following? General activity 5 Relation with others 5 Enjoyment of life 7 What TIME of day is your pain at its worst? morning , evening, and night Sleep (in general) Good  Pain is worse with: walking, bending, standing, and some activites Pain improves with: rest, therapy/exercise, and medication Relief from Meds: 8  Family History  Problem Relation Age of Onset   Hypertension Father    Heart disease Father    Cancer Father        prostate   Heart disease Other        (Maternal side) Ischemic heart disease   Diabetes Mellitus II Other    Arrhythmia Mother    Diabetes Mellitus II Mother        Borderline DM   Hypertension Mother    Asthma Mother    Cancer Brother    Dementia Brother    Hypertension Brother    Hypertension Daughter    Hypertension Daughter    Diabetes Daughter    Hypertension Daughter    Atrial fibrillation Daughter    GER disease Daughter    Hypertension Son    Anxiety disorder Son    Hypothyroidism Son    Breast cancer Maternal Grandmother        in her 10's   Social History   Socioeconomic History   Marital status: Widowed    Spouse name: Not on file   Number of children: Not on file   Years of education: Not on file   Highest education level: Not on file   Occupational History    Comment: retired LPN  Tobacco Use   Smoking status: Never   Smokeless tobacco: Never  Vaping Use   Vaping status: Never Used  Substance and Sexual Activity   Alcohol  use: Not Currently   Drug use: Never   Sexual activity: Not Currently  Other Topics Concern   Not on file  Social History Narrative   [place].  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      As of 03/15/17:   Diet: 1800 Calorie      Caffeine: Yes      Married, if yes what year: Widowed, married 1973      Do you live in a house, apartment, assisted living, [place], trailer, ect: House, 1 stories, and 1 person      Pets: No      Current/Past profession: LPN, retired       Exercise: Yes, walking          Living Will: Yes   DNR: No   POA/HPOA: Yes      Functional Status:   Do you  have difficulty bathing or dressing yourself? No   Do you have difficulty preparing food or eating? No   Do you have difficulty managing your medications? No   Do you have difficulty managing your finances? No   Do you have difficulty affording your medications? Yes   Social Drivers of Corporate investment banker Strain: Low Risk  (10/11/2017)   Overall Financial Resource Strain (CARDIA)    Difficulty of Paying Living Expenses: Not hard at all  Food Insecurity: No Food Insecurity (07/06/2023)   Hunger Vital Sign    Worried About Running Out of Food in the Last Year: Never true    Ran Out of Food in the Last Year: Never true  Transportation Needs: No Transportation Needs (07/06/2023)   PRAPARE - Administrator, Civil Service (Medical): No    Lack of Transportation (Non-Medical): No  Physical Activity: Inactive (10/11/2017)   Exercise Vital Sign    Days of Exercise per Week: 0 days    Minutes of Exercise per Session: 0 min  Stress: Stress Concern Present (10/11/2017)   [name] - Occupational  Stress Questionnaire    Feeling of Stress : To some extent  Social Connections: Somewhat Isolated (10/11/2017)   Social Connection and Isolation Panel    Frequency of Communication with Friends and Family: More than three times a week    Frequency of Social Gatherings with Friends and Family: More than three times a week    Attends Religious Services: More than 4 times per year    Active Member of [employer] or Organizations: No    Attends Banker Meetings: Never    Marital Status: Widowed   Past Surgical History:  Procedure Laterality Date   APPENDECTOMY  07/13/2017   BLADDER SUSPENSION  1990   w/hysterectomy   BOWEL RESECTION N/A 07/09/2018   Procedure: SMALL BOWEL RESECTION;  Surgeon: Jerryl Morin, MD;  [facility];  Service: General;  Laterality: N/A;   CARDIAC CATHETERIZATION  2005   no obstructive CAD per patient   CARDIAC CATHETERIZATION  2018   CARDIAC DEFIBRILLATOR PLACEMENT  2005   BiV ICD implanted,  LV lead is an epicardial lead   CARPAL TUNNEL RELEASE Left 07/25/2014   Dr.Williamson    CARPAL TUNNEL RELEASE Right 04/08/2019   Procedure: RIGHT CARPAL TUNNEL RELEASE ENDOSCOPIC;  Surgeon: Rober Chimera, MD;  Location: [facility] OR;  Service: Orthopedics;  Laterality: Right;   CATARACT EXTRACTION W/ INTRAOCULAR LENS  IMPLANT, BILATERAL Bilateral    COLONIC STENT PLACEMENT N/A 08/31/2017   Procedure: COLONIC STENT PLACEMENT;  Surgeon: Alvis Jourdain, MD;  Location: [facility] ENDOSCOPY;  Service: Endoscopy;  Laterality: N/A;   COLONOSCOPY     2010-2011 Dr.Kipreos    COLOSTOMY  12/2016   Maximo Spar 01/26/2017   COLOSTOMY REVERSAL N/A 07/13/2017   Procedure: COLOSTOMY REVERSAL;  Surgeon: Jerryl Morin, MD;  [facility];  Service: General;  Laterality: N/A;   CYST REMOVAL HAND Right 05/2013   thumb   DILATION AND CURETTAGE OF UTERUS  X 5-6   ESOPHAGOGASTRODUODENOSCOPY (EGD) WITH PROPOFOL  N/A 03/13/2020   Procedure: ESOPHAGOGASTRODUODENOSCOPY (EGD) WITH PROPOFOL ;  Surgeon: Alvis Jourdain, MD;  [facility] ENDOSCOPY;  Service: Endoscopy;  Laterality: N/A;   EXCISION MASS ABDOMINAL N/A 07/09/2018   Procedure: EXPLORATION OF  ABDOMINAL WOUND ERAS PATHWAY;  Surgeon: Jerryl Morin, MD;  Location: [facility] OR;  Service: General;  Laterality: N/A;   FLEXIBLE SIGMOIDOSCOPY N/A 08/24/2017   Procedure: FLEXIBLE SIGMOIDOSCOPY;  Surgeon: Alvis Jourdain, MD;  Location: [facility] ENDOSCOPY;  Service: Endoscopy;  Laterality: N/A;   FLEXIBLE SIGMOIDOSCOPY N/A 08/31/2017   Procedure: FLEXIBLE SIGMOIDOSCOPY;  Surgeon: Alvis Jourdain, MD;  Location: [facility] ENDOSCOPY;  Service: Endoscopy;  Laterality: N/A;  stent placement   FLEXIBLE SIGMOIDOSCOPY N/A 03/13/2020   Procedure: FLEXIBLE SIGMOIDOSCOPY;  Surgeon: Alvis Jourdain, MD;  [facility] ENDOS[facility];  Service: Endoscopy;  Laterality: N/A;   FLEXIBLE SIGMOIDOSCOPY N/A 12/18/2020   Procedure: FLEXIBLE SIGMOIDOSCOPY;  Surgeon: Alvis Jourdain, MD;  [facility] ENDOSCOPY;  Service: Endoscopy;  Laterality: N/A;   HEMOSTASIS CLIP PLACEMENT  03/13/2020   Procedure: HEMOSTASIS CLIP PLACEMENT;  Surgeon: Alvis Jourdain, MD;  [facility] ENDOSCOPY;  Service: Endoscopy;;   HEMOSTASIS CLIP PLACEMENT  12/18/2020   Procedure: HEMOSTASIS CLIP PLACEMENT;  Surgeon: Alvis Jourdain, MD;  [facility] ENDOS[facility];  Service: Endoscopy;;   HOT HEMOSTASIS N/A 03/13/2020   Procedure: HOT HEMOSTASIS (ARGON PLASMA COAGULATION/BICAP);  Surgeon: Alvis Jourdain, MD;  Location: [facility] ENDOSCOPY;  Service: Endoscopy;  Laterality: N/A;   HOT HEMOSTASIS N/A 12/18/2020   Procedure: HOT HEMOSTASIS (ARGON PLASMA COAGULATION/BICAP);  Surgeon: Alvis Jourdain, MD;  Location: [facility] ENDOSCOPY;  Service: Endoscopy;  Laterality: N/A;   IMPLANTABLE CARDIOVERTER DEFIBRILLATOR GENERATOR CHANGE  2008   INSERTION OF MESH N/A 07/09/2018   Procedure: INSERTION OF VICRYL MESH;  Surgeon: Jerryl Morin, MD;  [facility] OR;  Service: General;  Laterality: N/A;   LAPAROTOMY N/A 09/18/2017   Procedure: EXPLORATORY LAPAROTOMY;   Surgeon: Jerryl Morin, MD;  Location: [facility] OR;  Service: General;  Laterality: N/A;   LEAD REVISION  06/22/2018   LEAD REVISION/REPAIR N/A 06/22/2018   Procedure: LEAD REVISION/REPAIR;  Surgeon: Verona Goodwill, MD;  Location: [facility] INVASIVE CV LAB;  Service: Cardiovascular;  Laterality: N/A;   LYSIS OF ADHESION N/A 09/18/2017   Procedure: LYSIS OF ADHESION;  Surgeon: Jerryl Morin, MD;  Location: [facility] OR;  Service: General;  Laterality: N/A;   LYSIS OF ADHESION N/A 07/09/2018   Procedure: LYSIS OF ADHESION;  Surgeon: Jerryl Morin, MD;  Location: [facility] OR;  Service: General;  Laterality: N/A;   PARTIAL COLECTOMY N/A 09/18/2017   Procedure: ILEOCOLECTOMY;  Surgeon: Jerryl Morin, MD;  Location: [facility] OR;  Service: General;  Laterality: N/A;   PARTIAL COLECTOMY  09/25/2017   POLYPECTOMY  03/13/2020   Procedure: POLYPECTOMY;  Surgeon: Alvis Jourdain, MD;  [facility] ENDOSCOPY;  Service: Endoscopy;;   RIGHT/LEFT HEART CATH AND CORONARY ANGIOGRAPHY N/A 06/01/2018   Procedure: RIGHT/LEFT HEART CATH AND CORONARY ANGIOGRAPHY;  Surgeon: Lucendia Rusk, MD;  Location: [facility] INVASIVE CV LAB;  Service: Cardiovascular;  Laterality: N/A;   SIGMOIDOSCOPY N/A 09/18/2017   Procedure: SIGMOIDOSCOPY;  Surgeon: Jerryl Morin, MD;  [facility];  Service: General;  Laterality: N/A;   SMALL INTESTINE SURGERY  07/09/2018   EXPLORATION OF  ABDOMINAL WOUND ERAS PATHWAYN/; MESH; LYSIS OF ADHESIONS   TOTAL ABDOMINAL HYSTERECTOMY  1990   TRANSFORAMINAL LUMBAR INTERBODY FUSION (TLIF) WITH PEDICLE SCREW FIXATION 2 LEVEL N/A 01/07/2020   Procedure: Lumbar Four-Five and Lumbar Five-Sacral One open lumbar decompression and transforaminal lumbar interbody fusion;  Surgeon: Cannon Champion, MD;  [facility];  Service: Neurosurgery;  Laterality: N/A;  Lumbar Four-Five and Lumbar Five-Sacral One open lumbar decompression and transforaminal lumbar interbody fusion   TRIGGER FINGER RELEASE Right 05/213   TRIGGER FINGER RELEASE Right  04/08/2019   Procedure: RIGHT INDEX RELEASE TRIGGER FINGER/A-1 PULLEY;  Surgeon: Rober Chimera, MD;  Location: [facility] OR;  Service: Orthopedics;  Laterality: Right;  TUBAL LIGATION  1980s   VESICOVAGINAL FISTULA CLOSURE W/ TAH     Past Surgical History:  Procedure Laterality Date   APPENDECTOMY  07/13/2017   BLADDER SUSPENSION  1990   w/hysterectomy   BOWEL RESECTION N/A 07/09/2018   Procedure: SMALL BOWEL RESECTION;  Surgeon: Jerryl Morin, MD;  [facility];  Service: General;  Laterality: N/A;   CARDIAC CATHETERIZATION  2005   no obstructive CAD per patient   CARDIAC CATHETERIZATION  2018   CARDIAC DEFIBRILLATOR PLACEMENT  2005   BiV ICD implanted,  LV lead is an epicardial lead   CARPAL TUNNEL RELEASE Left 07/25/2014   Dr.Williamson    CARPAL TUNNEL RELEASE Right 04/08/2019   Procedure: RIGHT CARPAL TUNNEL RELEASE ENDOSCOPIC;  Surgeon: Rober Chimera, MD;  Location: [facility] OR;  Service: Orthopedics;  Laterality: Right;   CATARACT EXTRACTION W/ INTRAOCULAR LENS  IMPLANT, BILATERAL Bilateral    COLONIC STENT PLACEMENT N/A 08/31/2017   Procedure: COLONIC STENT PLACEMENT;  Surgeon: Alvis Jourdain, MD;  Location: [facility] ENDOSCOPY;  Service: Endoscopy;  Laterality: N/A;   COLONOSCOPY     2010-2011 Dr.Kipreos    COLOSTOMY  12/2016   Maximo Spar 01/26/2017   COLOSTOMY REVERSAL N/A 07/13/2017   Procedure: COLOSTOMY REVERSAL;  Surgeon: Jerryl Morin, MD;  [facility];  Service: General;  Laterality: N/A;   CYST REMOVAL HAND Right 05/2013   thumb   DILATION AND CURETTAGE OF UTERUS  X 5-6   ESOPHAGOGASTRODUODENOSCOPY (EGD) WITH PROPOFOL  N/A 03/13/2020   Procedure: ESOPHAGOGASTRODUODENOSCOPY (EGD) WITH PROPOFOL ;  Surgeon: Alvis Jourdain, MD;  [facility] ENDOS[facility];  Service: Endoscopy;  Laterality: N/A;   EXCISION MASS ABDOMINAL N/A 07/09/2018   Procedure: EXPLORATION OF  ABDOMINAL WOUND ERAS PATHWAY;  Surgeon: Jerryl Morin, MD;  Location: [facility] OR;  Service: General;  Laterality: N/A;   FLEXIBLE  SIGMOIDOSCOPY N/A 08/24/2017   Procedure: Marlynn Singer;  Surgeon: Alvis Jourdain, MD;  Location: [facility] ENDOSCOPY;  Service: Endoscopy;  Laterality: N/A;   FLEXIBLE SIGMOIDOSCOPY N/A 08/31/2017   Procedure: FLEXIBLE SIGMOIDOSCOPY;  Surgeon: Alvis Jourdain, MD;  Location: [facility] ENDOSCOPY;  Service: Endoscopy;  Laterality: N/A;  stent placement   FLEXIBLE SIGMOIDOSCOPY N/A 03/13/2020   Procedure: FLEXIBLE SIGMOIDOSCOPY;  Surgeon: Alvis Jourdain, MD;  [facility] ENDOSCOPY;  Service: Endoscopy;  Laterality: N/A;   FLEXIBLE SIGMOIDOSCOPY N/A 12/18/2020   Procedure: FLEXIBLE SIGMOIDOSCOPY;  Surgeon: Alvis Jourdain, MD;  [facility] ENDOSCOPY;  Service: Endoscopy;  Laterality: N/A;   HEMOSTASIS CLIP PLACEMENT  03/13/2020   Procedure: HEMOSTASIS CLIP PLACEMENT;  Surgeon: Alvis Jourdain, MD;  Location[facility] ENDOSCOPY;  Service: Endoscopy;;   HEMOSTASIS CLIP PLACEMENT  12/18/2020   Procedure: HEMOSTASIS CLIP PLACEMENT;  Surgeon: Alvis Jourdain, MD;  Location[facility] ENDOSCOPY;  Service: Endoscopy;;   HOT HEMOSTASIS N/A 03/13/2020   Procedure: HOT HEMOSTASIS (ARGON PLASMA COAGULATION/BICAP);  Surgeon: Alvis Jourdain, MD;  Location: [facility] ENDOSCOPY;  Service: Endoscopy;  Laterality: N/A;   HOT HEMOSTASIS N/A 12/18/2020   Procedure: HOT HEMOSTASIS (ARGON PLASMA COAGULATION/BICAP);  Surgeon: Alvis Jourdain, MD;  Location: [facility]COPY;  Service: Endoscopy;  Laterality: N/A;   IMPLANTABLE CARDIOVERTER DEFIBRILLATOR GENERATOR CHANGE  2008   INSERTION OF MESH N/A 07/09/2018   Procedure: INSERTION OF VICRYL MESH;  Surgeon: Jerryl Morin, MD;  Location: [facility] OR;  Service: General;  Laterality: N/A;   LAPAROTOMY N/A 09/18/2017   Procedure: EXPLORATORY LAPAROTOMY;  Surgeon: Jerryl Morin, MD;  Location: [facility] OR;  Service: General;  Laterality: N/A;   LEAD REVISION  06/22/2018   LEAD REVISION/REPAIR N/A 06/22/2018  Procedure: LEAD REVISION/REPAIR;  Surgeon: Verona Goodwill, MD;  Location: [facility] INVASIVE CV LAB;  Service: Cardiovascular;   Laterality: N/A;   LYSIS OF ADHESION N/A 09/18/2017   Procedure: LYSIS OF ADHESION;  Surgeon: Jerryl Morin, MD;  Location: [facility] OR;  Service: General;  Laterality: N/A;   LYSIS OF ADHESION N/A 07/09/2018   Procedure: LYSIS OF ADHESION;  Surgeon: Jerryl Morin, MD;  Location: [facility] OR;  Service: General;  Laterality: N/A;   PARTIAL COLECTOMY N/A 09/18/2017   Procedure: ILEOCOLECTOMY;  Surgeon: Jerryl Morin, MD;  Location: [facility] OR;  Service: General;  Laterality: N/A;   PARTIAL COLECTOMY  09/25/2017   POLYPECTOMY  03/13/2020   Procedure: POLYPECTOMY;  Surgeon: Alvis Jourdain, MD;  Location[facility] ENDOSCOPY;  Service: Endoscopy;;   RIGHT/LEFT HEART CATH AND CORONARY ANGIOGRAPHY N/A 06/01/2018   Procedure: RIGHT/LEFT HEART CATH AND CORONARY ANGIOGRAPHY;  Surgeon: Lucendia Rusk, MD;  Location: [facility] INVASIVE CV LAB;  Service: Cardiovascular;  Laterality: N/A;   SIGMOIDOSCOPY N/A 09/18/2017   Procedure: SIGMOIDOSCOPY;  Surgeon: Jerryl Morin, MD;  Location[facility] OR;  Service: General;  Laterality: N/A;   SMALL INTESTINE SURGERY  07/09/2018   EXPLORATION OF  ABDOMINAL WOUND ERAS PATHWAYN/; MESH; LYSIS OF ADHESIONS   TOTAL ABDOMINAL HYSTERECTOMY  1990   TRANSFORAMINAL LUMBAR INTERBODY FUSION (TLIF) WITH PEDICLE SCREW FIXATION 2 LEVEL N/A 01/07/2020   Procedure: Lumbar Four-Five and Lumbar Five-Sacral One open lumbar decompression and transforaminal lumbar interbody fusion;  Surgeon: Cannon Champion, MD;  Location: [facility] OR;  Service: Neurosurgery;  Laterality: N/A;  Lumbar Four-Five and Lumbar Five-Sacral One open lumbar decompression and transforaminal lumbar interbody fusion   TRIGGER FINGER RELEASE Right 05/213   TRIGGER FINGER RELEASE Right 04/08/2019   Procedure: RIGHT INDEX RELEASE TRIGGER FINGER/A-1 PULLEY;  Surgeon: Rober Chimera, MD;  Location: [facility] OR;  Service: Orthopedics;  Laterality: Right;   TUBAL LIGATION  1980s   VESICOVAGINAL FISTULA CLOSURE W/ TAH     Past Medical History:  Diagnosis Date    AICD (automatic cardioverter/defibrillator) present    high RV threshold chronically, device was turned off in 2014; Device battery has been dead x 7 years; turned it back on 06/22/2018   Anemia, iron  deficiency    Angioedema    felt to likely be due to ace inhibitors but says she has had this even off of medicines, appears to be tolerating ARBs chronically   Anxiety    Arthritis    hands, legs, arms; bad in my back (06/22/2018)   Asthmatic bronchitis with status asthmaticus    Bradycardia    CHF (congestive heart failure) (HCC)    Chronic lower back pain    Chronic pain    CKD (chronic kidney disease), stage III (HCC)    Coronary artery disease    Mild nonobstructive (30% LAD, 30% RCA) by 09/01/16 cath at [facility]   Degenerative joint disease    Depression    Diabetes mellitus, type 2 (HCC)    Diabetic peripheral neuropathy (HCC)    Dizziness    Dyspnea on exertion    Family history of adverse reaction to anesthesia    Mother has nausea   GERD (gastroesophageal reflux disease)    Gout    Heart valve disorder    History of blood transfusion 1981; ~ 2005; 12/2016   childbirth; defibrillator OR; colostomy OR   History of mononucleosis 03/2014   Hyperlipidemia    Hypertension    Hypothyroid    Insomnia    Intervertebral  disc degeneration    Ischemic cardiomyopathy    LBBB (left bundle branch block)    Myocardial infarction [facility]) dx'd ~ 2005   Nonischemic cardiomyopathy (HCC)    s/p ICD in 2005. EF has since recovered   Obesity    On home oxygen  therapy    2L at night (06/22/2018)   OSA on CPAP    Pneumonia    couple times; last time was 12/2016 (06/22/2018)   PONV (postoperative nausea and vomiting)    Presence of permanent cardiac pacemaker    [employer]   Swelling    Syncope    Systemic hypertension    Vitamin D  deficiency    There were no vitals taken for this visit.  Opioid Risk Score:   Fall Risk Score:  `1  Depression screen [facility]  2/9     12/11/2023    1:47 PM 11/06/2023   10:53 AM 09/11/2023   10:15 AM 08/10/2023   10:54 AM 07/10/2023    9:59 AM 06/15/2023   11:10 AM 03/29/2023   11:15 AM  Depression screen PHQ 2/9  Decreased Interest 0 0 0 0 0 0 0  Down, Depressed, Hopeless 0 0 0 0 0 0 0  PHQ - 2 Score 0 0 0 0 0 0 0    Review of Systems  Musculoskeletal:  Positive for back pain, gait problem and neck pain.       Pain in both knees  All other systems reviewed and are negative.      Objective:   Physical Exam Vitals and nursing note reviewed.  Constitutional:      Appearance: Normal appearance.   Cardiovascular:     Rate and Rhythm: Normal rate and regular rhythm.     Pulses: Normal pulses.     Heart sounds: Normal heart sounds.  Pulmonary:     Effort: Pulmonary effort is normal.     Breath sounds: Normal breath sounds.   Musculoskeletal:     Comments: Normal Muscle Bulk and Muscle Testing Reveals:  Upper Extremities:Full  ROM and Muscle Strength  5/5  Lumbar Paraspinal Tenderness: L-4-L-5 Lower Extremities: Full ROM and Muscle Strength 5/5 Arises from Table Slowly using walker for support Narrow Based  Gait      Skin:    General: Skin is warm and dry.   Neurological:     Mental Status: She is alert and oriented to person, place, and time.   Psychiatric:        Mood and Affect: Mood normal.        Behavior: Behavior normal.         Assessment & Plan:  Chronic Low Back Pain: Encouraged to increase HEP as Tolerated. Continue to Monitor. 01/08/2024 2. Bilateral Primary Osteoarthritis:  S/P Right Knee Genicular nerve radiofrequency neurotomy, with Dr Sharl Davies. On 08/02/2022 and S/P Left Knee Genicular Nerve Radiofrequency on 08/16/2022. With good relief noted.  : Indication Chronic post operative pain in the Knee, pain postop total knee replacement which has not responded to conservative management such as physical therapy and medication management; Continue monitor. 01/08/2024 3.  Myofascial Pain: Continue HEP as Tolerated. Continue to Monitor.  01/08/2024 4. Chronic Pain Syndrome:RX;  Oxycodone  7.5mg  /325 mg one tablet every 6 hours as needed for pain. Oxycodone  10 mg discontinued.  Discontinue Hydrocodone  due to itching,  We will continue the opioid monitoring program, this consists of regular clinic visits, examinations, urine drug screen, pill counts as well as use of [place]  Controlled Substance  Reporting system. A 12 month History has been reviewed on the [place]  Controlled Substance Reporting System on 01/08/2024. 5. Polyarthralgia: Continue HEP as tolerated. Continue current medication regimen. Continue to monitor. 01/08/2024 6. Chronic Bilateral Thoracic - Back Pain: no complaints today. Continue HEP as Tolerated. Continue to Monitor. 01/08/2024.    F/U in 1 month

## 2024-01-08 ENCOUNTER — Encounter: Payer: Self-pay | Admitting: Registered Nurse

## 2024-01-08 ENCOUNTER — Encounter: Attending: Registered Nurse | Admitting: Registered Nurse

## 2024-01-08 VITALS — BP 100/66 | HR 75 | Ht 64.0 in | Wt 244.0 lb

## 2024-01-08 DIAGNOSIS — M545 Low back pain, unspecified: Secondary | ICD-10-CM | POA: Diagnosis present

## 2024-01-08 DIAGNOSIS — Z79891 Long term (current) use of opiate analgesic: Secondary | ICD-10-CM | POA: Diagnosis present

## 2024-01-08 DIAGNOSIS — Z5181 Encounter for therapeutic drug level monitoring: Secondary | ICD-10-CM | POA: Insufficient documentation

## 2024-01-08 DIAGNOSIS — M170 Bilateral primary osteoarthritis of knee: Secondary | ICD-10-CM | POA: Diagnosis not present

## 2024-01-08 DIAGNOSIS — G894 Chronic pain syndrome: Secondary | ICD-10-CM | POA: Insufficient documentation

## 2024-01-08 DIAGNOSIS — G8929 Other chronic pain: Secondary | ICD-10-CM | POA: Diagnosis present

## 2024-01-08 MED ORDER — OXYCODONE-ACETAMINOPHEN 7.5-325 MG PO TABS: 1.0000 | ORAL_TABLET | Freq: Four times a day (QID) | ORAL | 0 refills | Status: DC | PRN

## 2024-01-14 ENCOUNTER — Ambulatory Visit: Payer: Self-pay | Admitting: Cardiology

## 2024-01-18 ENCOUNTER — Encounter: Payer: Self-pay | Admitting: Cardiology

## 2024-01-18 ENCOUNTER — Ambulatory Visit: Attending: Cardiology | Admitting: Cardiology

## 2024-01-18 VITALS — BP 148/61 | HR 73 | Ht 64.0 in | Wt 248.0 lb

## 2024-01-18 DIAGNOSIS — Z95810 Presence of automatic (implantable) cardiac defibrillator: Secondary | ICD-10-CM | POA: Diagnosis not present

## 2024-01-18 DIAGNOSIS — I428 Other cardiomyopathies: Secondary | ICD-10-CM

## 2024-01-18 DIAGNOSIS — D6869 Other thrombophilia: Secondary | ICD-10-CM

## 2024-01-18 DIAGNOSIS — I480 Paroxysmal atrial fibrillation: Secondary | ICD-10-CM

## 2024-01-18 DIAGNOSIS — I5022 Chronic systolic (congestive) heart failure: Secondary | ICD-10-CM | POA: Diagnosis not present

## 2024-01-18 DIAGNOSIS — I447 Left bundle-branch block, unspecified: Secondary | ICD-10-CM

## 2024-01-18 NOTE — Patient Instructions (Signed)
 Medication Instructions:  Your physician recommends that you continue on your current medications as directed. Please refer to the Current Medication list given to you today.  *If you need a refill on your cardiac medications before your next appointment, please call your pharmacy*  Follow-Up: At [facility], you and your health needs are our priority.  As part of our continuing mission to provide you with exceptional heart care, our providers are all part of one team.  This team includes your primary Cardiologist (physician) and Advanced Practice Providers or APPs (Physician Assistants and Nurse Practitioners) who all work together to provide you with the care you need, when you need it.  Your next appointment:   6 months  Provider:   You will see one of the following Advanced Practice Providers on your designated Care Team:   Mertha Abrahams, Kennard Pea [address] [place], PA-C Suzann Riddle, NP Creighton Doffing, NP

## 2024-01-18 NOTE — Progress Notes (Signed)
 Electrophysiology Office Note:   Date:  01/19/2024  ID:  ALLEXIS BORDENAVE, DOB [birth year], MRN [id number]  Primary Cardiologist: Candyce Reek, MD Electrophysiologist: Elspeth Sage, MD      History of Present Illness:   Crystal [name] is a 73 y.o. female with h/o chronic systolic heart failure secondary to NICM and LBBB s/p BiV ICD, HTN, HLD, non-obs CAD, CKD, GERD DM II, & hypothyroidism who is being seen today for device follow up.  Discussed the use of AI scribe software for clinical note transcription with the patient, who gave verbal consent to proceed.  History of Present Illness Crystal [name] is a 73 year old female with atrial fibrillation who presents for follow-up regarding her cardiac device and atrial fibrillation episodes. She was referred due to the retirement of her previous doctor, Dr. Sage.  She experiences shortness of breath during exertion, such as climbing stairs or walking a distance, with temporary drops in oxygen  levels that return to normal. No swelling or fluid retention is noted, although she previously experienced fluid retention in her abdomen.  Her cardiac device underwent a lead revision in 2019. One of the wires requires more energy due to its location, and the device is estimated to have about three years of battery life remaining. Occasional episodes of atrial fibrillation are detected by the device, although she does not feel these episodes. The longest episode lasted approximately four hours.  She has a history of bleeding issues, having been taken off baby aspirin  in 2018 due to low hemoglobin levels requiring blood transfusions. She has had blood in her stool, attributed to hemorrhoids, and has undergone five bowel surgeries. She has not required blood transfusions recently but has received iron  intravenously. No history of stroke.   Review of systems complete and found to be negative unless listed in HPI.   EP Information / Studies Reviewed:    EKG  is ordered today. Personal review as below.  EKG Interpretation Date/Time:  Thursday January 18 2024 14:17:39 EDT Ventricular Rate:  73 PR Interval:  120 QRS Duration:  148 QT Interval:  480 QTC Calculation: 528 R Axis:   -55  Text Interpretation: Atrial sensed and bi-ventricular paced rhythm When compared with ECG of 27-Oct-2021 09:06, Paced QRS narrower Confirmed by Kennyth Chew [phone]) on 01/18/2024 2:56:05 PM   Echo 11/22/22:  1. Left ventricular ejection fraction, by estimation, is 60 to 65%. The  left ventricle has normal function. The left ventricle has no regional  wall motion abnormalities. There is mild left ventricular hypertrophy.  Left ventricular diastolic parameters  are consistent with Grade I diastolic dysfunction (impaired relaxation).   2. Right ventricular systolic function is normal. The right ventricular  size is normal. There is normal pulmonary artery systolic pressure. The  estimated right ventricular systolic pressure is 27.8 mmHg.   3. The mitral valve is normal in structure. Trivial mitral valve  regurgitation. No evidence of mitral stenosis.   4. The aortic valve is grossly normal. There is mild thickening of the  aortic valve. Aortic valve regurgitation is not visualized. No aortic  stenosis is present.   5. The inferior vena cava is normal in size with greater than 50%  respiratory variability, suggesting right atrial pressure of 3 mmHg.       Physical Exam:   VS:  BP (!) 148/61   Pulse 73   Ht 5' 4 (1.626 m)   Wt 248 lb (112.5 kg)   SpO2 90%   BMI 42.57  kg/m    Wt Readings from Last 3 Encounters:  01/18/24 248 lb (112.5 kg)  01/08/24 244 lb (110.7 kg)  12/11/23 246 lb (111.6 kg)     GEN: Well nourished, well developed in no acute distress NECK: No JVD CARDIAC: Normal rate, regular rhythm RESPIRATORY:  Clear to auscultation without rales, wheezing or rhonchi  ABDOMEN: Soft, non-distended EXTREMITIES:  No edema; No deformity   ASSESSMENT  AND PLAN:    #Chronic heart failure 2/2 NICM s/p CRT-D: Patient has a Financial planner.  She has an abandoned RV ICD lead.  She has a Best boy. #LBBB - In clinic device interrogation was performed today.  Appropriate device function stable lead parameters.  She has a chronically elevated LV threshold.  Unable to program around this.  Estimated longevity 3 years.  Presenting rhythm is atrial sensed and biventricular paced.  No programming changes made today.  No device therapies or ventricular arrhythmia episodes. -Continue remote monitoring. -Continue GDMT regimen of carvedilol  25 mg twice daily, Farxiga 10 mg daily, hydralazine  25 mg twice daily. -Refer back to general cardiology for HLD, HTN, non-obstructive CAD, and HF management - previously established with Dr. Dann.   # Subclinical/device detected atrial fibrillation: AT/AF burden less than 1%.  She has had 2 episodes lasting between 1 and 24 hours. #Secondary hypercoagulable state due to AF  - She has subclinical atrial fibrillation.  Thus far, she has had no episodes lasting longer than 24 hours.  She has had history of GI bleeding, multiple bowel surgeries and prior blood transfusions..  Given this, at this time benefits of anticoagulation do not obviously outweigh the risks.  Risk and benefits of anticoagulation discussed with patient.  Will continue to monitor AT/AF burden.  If burden increases then we will start anticoagulation.   #Hypertension -Above goal today.  Recommend checking blood pressures 1-2 times per week at home and recording the values.  Recommend bringing these recordings to the primary care physician.  Follow up with Dr. Kennyth in 6 months  Signed, Fonda Kennyth, MD

## 2024-02-06 NOTE — Progress Notes (Signed)
 Subjective:    Patient ID: Crystal [name], female    DOB: Dec 29, 1950, 73 y.o.   MRN: [id number]  HPI: Crystal [name] is a 73 y.o. female who returns for follow up appointment for chronic pain and medication refill. She states her pain is located in her neck, lower back and bilateral knee pain. She rates her pain 7. Her current exercise regime is walking and performing stretching exercises.  Crystal [name] Morphine  equivalent is 45.00 MME.   Last Oral Swab was Performed on 12/11/2023, it was consistent.      Pain Inventory Average Pain 10 Pain Right Now 7 My pain is intermittent, sharp, burning, stabbing, and aching  In the last 24 hours, has pain interfered with the following? General activity 8 Relation with others 6 Enjoyment of life 7 What TIME of day is your pain at its worst? morning , evening, and night Sleep (in general) Good  Pain is worse with: walking, sitting, standing, and some activites Pain improves with: rest, therapy/exercise, and medication Relief from Meds: 7  Family History  Problem Relation Age of Onset   Hypertension Father    Heart disease Father    Cancer Father        prostate   Heart disease Other        (Maternal side) Ischemic heart disease   Diabetes Mellitus II Other    Arrhythmia Mother    Diabetes Mellitus II Mother        Borderline DM   Hypertension Mother    Asthma Mother    Cancer Brother    Dementia Brother    Hypertension Brother    Hypertension Daughter    Hypertension Daughter    Diabetes Daughter    Hypertension Daughter    Atrial fibrillation Daughter    GER disease Daughter    Hypertension Son    Anxiety disorder Son    Hypothyroidism Son    Breast cancer Maternal Grandmother        in her 47's   Social History   Socioeconomic History   Marital status: Widowed    Spouse name: Not on file   Number of children: Not on file   Years of education: Not on file   Highest education level: Not on file  Occupational History     Comment: retired LPN  Tobacco Use   Smoking status: Never   Smokeless tobacco: Never  Vaping Use   Vaping status: Never Used  Substance and Sexual Activity   Alcohol  use: Not Currently   Drug use: Never   Sexual activity: Not Currently  Other Topics Concern   Not on file  Social History Narrative   [place] in [place].  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      As of 03/15/17:   Diet: 1800 Calorie      Caffeine: Yes      Married, if yes what year: Widowed, married 1973      Do you live in a house, apartment, assisted living, condo, trailer, ect: House, 1 stories, and 1 person      Pets: No      Current/Past profession: LPN, retired       Exercise: Yes, walking          Living Will: Yes   DNR: No   POA/HPOA: Yes      Functional Status:   Do you have difficulty bathing or dressing yourself?  No   Do you have difficulty preparing food or eating? No   Do you have difficulty managing your medications? No   Do you have difficulty managing your finances? No   Do you have difficulty affording your medications? Yes   Social Drivers of Corporate investment banker Strain: Low Risk  (10/11/2017)   Overall Financial Resource Strain (CARDIA)    Difficulty of Paying Living Expenses: Not hard at all  Food Insecurity: No Food Insecurity (07/06/2023)   Hunger Vital Sign    Worried About Running Out of Food in the Last Year: Never true    Ran Out of Food in the Last Year: Never true  Transportation Needs: No Transportation Needs (07/06/2023)   PRAPARE - Administrator, Civil Service (Medical): No    Lack of Transportation (Non-Medical): No  Physical Activity: Inactive (10/11/2017)   Exercise Vital Sign    Days of Exercise per Week: 0 days    Minutes of Exercise per Session: 0 min  Stress: Stress Concern Present (10/11/2017)   [name] - Occupational Stress Questionnaire     Feeling of Stress : To some extent  Social Connections: Somewhat Isolated (10/11/2017)   Social Connection and Isolation Panel    Frequency of Communication with Friends and Family: More than three times a week    Frequency of Social Gatherings with Friends and Family: More than three times a week    Attends Religious Services: More than 4 times per year    Active Member of [employer] or Organizations: No    Attends Banker Meetings: Never    Marital Status: Widowed   Past Surgical History:  Procedure Laterality Date   APPENDECTOMY  07/13/2017   BLADDER SUSPENSION  1990   w/hysterectomy   BOWEL RESECTION N/A 07/09/2018   Procedure: SMALL BOWEL RESECTION;  Surgeon: Kimble Agent, MD;  [facility];  Service: General;  Laterality: N/A;   CARDIAC CATHETERIZATION  2005   no obstructive CAD per patient   CARDIAC CATHETERIZATION  2018   CARDIAC DEFIBRILLATOR PLACEMENT  2005   BiV ICD implanted,  LV lead is an epicardial lead   CARPAL TUNNEL RELEASE Left 07/25/2014   Dr.Williamson    CARPAL TUNNEL RELEASE Right 04/08/2019   Procedure: RIGHT CARPAL TUNNEL RELEASE ENDOSCOPIC;  Surgeon: Sebastian Lenis, MD;  Location: [facility] OR;  Service: Orthopedics;  Laterality: Right;   CATARACT EXTRACTION W/ INTRAOCULAR LENS  IMPLANT, BILATERAL Bilateral    COLONIC STENT PLACEMENT N/A 08/31/2017   Procedure: COLONIC STENT PLACEMENT;  Surgeon: Rollin Dover, MD;  Location: [facility] ENDOSCOPY;  Service: Endoscopy;  Laterality: N/A;   COLONOSCOPY     2010-2011 Dr.Kipreos    COLOSTOMY  12/2016   thelbert 01/26/2017   COLOSTOMY REVERSAL N/A 07/13/2017   Procedure: COLOSTOMY REVERSAL;  Surgeon: Kimble Agent, MD;  [facility];  Service: General;  Laterality: N/A;   CYST REMOVAL HAND Right 05/2013   thumb   DILATION AND CURETTAGE OF UTERUS  X 5-6   ESOPHAGOGASTRODUODENOSCOPY (EGD) WITH PROPOFOL  N/A 03/13/2020   Procedure: ESOPHAGOGASTRODUODENOSCOPY (EGD) WITH PROPOFOL ;  Surgeon: Rollin Dover, MD;  Location: [facility]  ENDOSCOPY;  Service: Endoscopy;  Laterality: N/A;   EXCISION MASS ABDOMINAL N/A 07/09/2018   Procedure: EXPLORATION OF  ABDOMINAL WOUND ERAS PATHWAY;  Surgeon: Kimble Agent, MD;  Location: [facility] OR;  Service: General;  Laterality: N/A;   FLEXIBLE SIGMOIDOSCOPY N/A 08/24/2017   Procedure: ENID MORIN;  Surgeon: Rollin Dover, MD;  Location:  [facility] ENDOSCOPY;  Service: Endoscopy;  Laterality: N/A;   FLEXIBLE SIGMOIDOSCOPY N/A 08/31/2017   Procedure: FLEXIBLE SIGMOIDOSCOPY;  Surgeon: Rollin Dover, MD;  Location: [facility] ENDOSCOPY;  Service: Endoscopy;  Laterality: N/A;  stent placement   FLEXIBLE SIGMOIDOSCOPY N/A 03/13/2020   Procedure: FLEXIBLE SIGMOIDOSCOPY;  Surgeon: Rollin Dover, MD;  [facility] ENDOSCOPY;  Service: Endoscopy;  Laterality: N/A;   FLEXIBLE SIGMOIDOSCOPY N/A 12/18/2020   Procedure: FLEXIBLE SIGMOIDOSCOPY;  Surgeon: Rollin Dover, MD;  [facility] ENDOSCOPY;  Service: Endoscopy;  Laterality: N/A;   HEMOSTASIS CLIP PLACEMENT  03/13/2020   Procedure: HEMOSTASIS CLIP PLACEMENT;  Surgeon: Rollin Dover, MD;  [facility] ENDOSCOPY;  Service: Endoscopy;;   HEMOSTASIS CLIP PLACEMENT  12/18/2020   Procedure: HEMOSTASIS CLIP PLACEMENT;  Surgeon: Rollin Dover, MD;  [facility] ENDOSCOPY;  Service: Endoscopy;;   HOT HEMOSTASIS N/A 03/13/2020   Procedure: HOT HEMOSTASIS (ARGON PLASMA COAGULATION/BICAP);  Surgeon: Rollin Dover, MD;  Location: [facility];  Service: Endoscopy;  Laterality: N/A;   HOT HEMOSTASIS N/A 12/18/2020   Procedure: HOT HEMOSTASIS (ARGON PLASMA COAGULATION/BICAP);  Surgeon: Rollin Dover, MD;  Location: [facility] ENDOSCOPY;  Service: Endoscopy;  Laterality: N/A;   IMPLANTABLE CARDIOVERTER DEFIBRILLATOR GENERATOR CHANGE  2008   INSERTION OF MESH N/A 07/09/2018   Procedure: INSERTION OF VICRYL MESH;  Surgeon: Kimble Agent, MD;  [facility];  Service: General;  Laterality: N/A;   LAPAROTOMY N/A 09/18/2017   Procedure: EXPLORATORY LAPAROTOMY;  Surgeon: Kimble Agent, MD;   Location: [facility] OR;  Service: General;  Laterality: N/A;   LEAD REVISION  06/22/2018   LEAD REVISION/REPAIR N/A 06/22/2018   Procedure: LEAD REVISION/REPAIR;  Surgeon: Fernande Elspeth BROCKS, MD;  Location: [facility] INVASIVE CV LAB;  Service: Cardiovascular;  Laterality: N/A;   LYSIS OF ADHESION N/A 09/18/2017   Procedure: LYSIS OF ADHESION;  Surgeon: Kimble Agent, MD;  [facility] OR;  Service: General;  Laterality: N/A;   LYSIS OF ADHESION N/A 07/09/2018   Procedure: LYSIS OF ADHESION;  Surgeon: Kimble Agent, MD;  Location: [facility] OR;  Service: General;  Laterality: N/A;   PARTIAL COLECTOMY N/A 09/18/2017   Procedure: ILEOCOLECTOMY;  Surgeon: Kimble Agent, MD;  Location: [facility] OR;  Service: General;  Laterality: N/A;   PARTIAL COLECTOMY  09/25/2017   POLYPECTOMY  03/13/2020   Procedure: POLYPECTOMY;  Surgeon: Rollin Dover, MD;  [facility] ENDOSCOPY;  Service: Endoscopy;;   RIGHT/LEFT HEART CATH AND CORONARY ANGIOGRAPHY N/A 06/01/2018   Procedure: RIGHT/LEFT HEART CATH AND CORONARY ANGIOGRAPHY;  Surgeon: Dann Candyce RAMAN, MD;  Location: [facility] INVASIVE CV LAB;  Service: Cardiovascular;  Laterality: N/A;   SIGMOIDOSCOPY N/A 09/18/2017   Procedure: SIGMOIDOSCOPY;  Surgeon: Kimble Agent, MD;  [facility];  Service: General;  Laterality: N/A;   SMALL INTESTINE SURGERY  07/09/2018   EXPLORATION OF  ABDOMINAL WOUND ERAS PATHWAYN/; MESH; LYSIS OF ADHESIONS   TOTAL ABDOMINAL HYSTERECTOMY  1990   TRANSFORAMINAL LUMBAR INTERBODY FUSION (TLIF) WITH PEDICLE SCREW FIXATION 2 LEVEL N/A 01/07/2020   Procedure: Lumbar Four-Five and Lumbar Five-Sacral One open lumbar decompression and transforaminal lumbar interbody fusion;  Surgeon: Cheryle Debby LABOR, MD;  [facility] OR;  Service: Neurosurgery;  Laterality: N/A;  Lumbar Four-Five and Lumbar Five-Sacral One open lumbar decompression and transforaminal lumbar interbody fusion   TRIGGER FINGER RELEASE Right 05/213   TRIGGER FINGER RELEASE Right 04/08/2019   Procedure: RIGHT  INDEX RELEASE TRIGGER FINGER/A-1 PULLEY;  Surgeon: Sebastian Lenis, MD;  Location:  [facility] OR;  Service: Orthopedics;  Laterality: Right;   TUBAL LIGATION  1980s  VESICOVAGINAL FISTULA CLOSURE W/ TAH     Past Surgical History:  Procedure Laterality Date   APPENDECTOMY  07/13/2017   BLADDER SUSPENSION  1990   w/hysterectomy   BOWEL RESECTION N/A 07/09/2018   Procedure: SMALL BOWEL RESECTION;  Surgeon: Kimble Agent, MD;  [facility];  Service: General;  Laterality: N/A;   CARDIAC CATHETERIZATION  2005   no obstructive CAD per patient   CARDIAC CATHETERIZATION  2018   CARDIAC DEFIBRILLATOR PLACEMENT  2005   BiV ICD implanted,  LV lead is an epicardial lead   CARPAL TUNNEL RELEASE Left 07/25/2014   Dr.Williamson    CARPAL TUNNEL RELEASE Right 04/08/2019   Procedure: RIGHT CARPAL TUNNEL RELEASE ENDOSCOPIC;  Surgeon: Sebastian Lenis, MD;  Location: [facility] OR;  Service: Orthopedics;  Laterality: Right;   CATARACT EXTRACTION W/ INTRAOCULAR LENS  IMPLANT, BILATERAL Bilateral    COLONIC STENT PLACEMENT N/A 08/31/2017   Procedure: COLONIC STENT PLACEMENT;  Surgeon: Rollin Dover, MD;  Location: [facility] ENDOSCOPY;  Service: Endoscopy;  Laterality: N/A;   COLONOSCOPY     2010-2011 Dr.Kipreos    COLOSTOMY  12/2016   thelbert 01/26/2017   COLOSTOMY REVERSAL N/A 07/13/2017   Procedure: COLOSTOMY REVERSAL;  Surgeon: Kimble Agent, MD;  [facility];  Service: General;  Laterality: N/A;   CYST REMOVAL HAND Right 05/2013   thumb   DILATION AND CURETTAGE OF UTERUS  X 5-6   ESOPHAGOGASTRODUODENOSCOPY (EGD) WITH PROPOFOL  N/A 03/13/2020   Procedure: ESOPHAGOGASTRODUODENOSCOPY (EGD) WITH PROPOFOL ;  Surgeon: Rollin Dover, MD;  [facility] ENDOSCOPY;  Service: Endoscopy;  Laterality: N/A;   EXCISION MASS ABDOMINAL N/A 07/09/2018   Procedure: EXPLORATION OF  ABDOMINAL WOUND ERAS PATHWAY;  Surgeon: Kimble Agent, MD;  Location: [facility] OR;  Service: General;  Laterality: N/A;   FLEXIBLE SIGMOIDOSCOPY N/A 08/24/2017    Procedure: ENID MORIN;  Surgeon: Rollin Dover, MD;  Location: [facility] ENDOSCOPY;  Service: Endoscopy;  Laterality: N/A;   FLEXIBLE SIGMOIDOSCOPY N/A 08/31/2017   Procedure: FLEXIBLE SIGMOIDOSCOPY;  Surgeon: Rollin Dover, MD;  Location: [facility] ENDOSCOPY;  Service: Endoscopy;  Laterality: N/A;  stent placement   FLEXIBLE SIGMOIDOSCOPY N/A 03/13/2020   Procedure: FLEXIBLE SIGMOIDOSCOPY;  Surgeon: Rollin Dover, MD;  [facility] ENDOSCOPY;  Service: Endoscopy;  Laterality: N/A;   FLEXIBLE SIGMOIDOSCOPY N/A 12/18/2020   Procedure: FLEXIBLE SIGMOIDOSCOPY;  Surgeon: Rollin Dover, MD;  [facility] ENDOSCOPY;  Service: Endoscopy;  Laterality: N/A;   HEMOSTASIS CLIP PLACEMENT  03/13/2020   Procedure: HEMOSTASIS CLIP PLACEMENT;  Surgeon: Rollin Dover, MD;  Location: [facility] ENDOSCOPY;  Service: Endoscopy;;   HEMOSTASIS CLIP PLACEMENT  12/18/2020   Procedure: HEMOSTASIS CLIP PLACEMENT;  Surgeon: Rollin Dover, MD;  Location[facility] ENDOSCOPY;  Service: Endoscopy;;   HOT HEMOSTASIS N/A 03/13/2020   Procedure: HOT HEMOSTASIS (ARGON PLASMA COAGULATION/BICAP);  Surgeon: Rollin Dover, MD;  Location: [facility];  Service: Endoscopy;  Laterality: N/A;   HOT HEMOSTASIS N/A 12/18/2020   Procedure: HOT HEMOSTASIS (ARGON PLASMA COAGULATION/BICAP);  Surgeon: Rollin Dover, MD;  Location: [facility];  Service: Endoscopy;  Laterality: N/A;   IMPLANTABLE CARDIOVERTER DEFIBRILLATOR GENERATOR CHANGE  2008   INSERTION OF MESH N/A 07/09/2018   Procedure: INSERTION OF VICRYL MESH;  Surgeon: Kimble Agent, MD;  Location: [facility] OR;  Service: General;  Laterality: N/A;   LAPAROTOMY N/A 09/18/2017   Procedure: EXPLORATORY LAPAROTOMY;  Surgeon: Kimble Agent, MD;  Location: [facility] OR;  Service: General;  Laterality: N/A;   LEAD REVISION  06/22/2018   LEAD REVISION/REPAIR N/A 06/22/2018   Procedure: LEAD REVISION/REPAIR;  Surgeon: Fernande Elspeth BROCKS, MD;  Location: [facility] INVASIVE CV LAB;  Service: Cardiovascular;  Laterality: N/A;   LYSIS OF  ADHESION N/A 09/18/2017   Procedure: LYSIS OF ADHESION;  Surgeon: Kimble Agent, MD;  Location: [facility] OR;  Service: General;  Laterality: N/A;   LYSIS OF ADHESION N/A 07/09/2018   Procedure: LYSIS OF ADHESION;  Surgeon: Kimble Agent, MD;  Location: [facility] OR;  Service: General;  Laterality: N/A;   PARTIAL COLECTOMY N/A 09/18/2017   Procedure: ILEOCOLECTOMY;  Surgeon: Kimble Agent, MD;  Location: [facility] OR;  Service: General;  Laterality: N/A;   PARTIAL COLECTOMY  09/25/2017   POLYPECTOMY  03/13/2020   Procedure: POLYPECTOMY;  Surgeon: Rollin Dover, MD;  Location[facility] ENDOSCOPY;  Service: Endoscopy;;   RIGHT/LEFT HEART CATH AND CORONARY ANGIOGRAPHY N/A 06/01/2018   Procedure: RIGHT/LEFT HEART CATH AND CORONARY ANGIOGRAPHY;  Surgeon: Dann Candyce RAMAN, MD;  Location: [facility] INVASIVE CV LAB;  Service: Cardiovascular;  Laterality: N/A;   SIGMOIDOSCOPY N/A 09/18/2017   Procedure: SIGMOIDOSCOPY;  Surgeon: Kimble Agent, MD;  [facility];  Service: General;  Laterality: N/A;   SMALL INTESTINE SURGERY  07/09/2018   EXPLORATION OF  ABDOMINAL WOUND ERAS PATHWAYN/; MESH; LYSIS OF ADHESIONS   TOTAL ABDOMINAL HYSTERECTOMY  1990   TRANSFORAMINAL LUMBAR INTERBODY FUSION (TLIF) WITH PEDICLE SCREW FIXATION 2 LEVEL N/A 01/07/2020   Procedure: Lumbar Four-Five and Lumbar Five-Sacral One open lumbar decompression and transforaminal lumbar interbody fusion;  Surgeon: Cheryle Debby LABOR, MD;  Location: [facility] OR;  Service: Neurosurgery;  Laterality: N/A;  Lumbar Four-Five and Lumbar Five-Sacral One open lumbar decompression and transforaminal lumbar interbody fusion   TRIGGER FINGER RELEASE Right 05/213   TRIGGER FINGER RELEASE Right 04/08/2019   Procedure: RIGHT INDEX RELEASE TRIGGER FINGER/A-1 PULLEY;  Surgeon: Sebastian Lenis, MD;  Location: [facility] OR;  Service: Orthopedics;  Laterality: Right;   TUBAL LIGATION  1980s   VESICOVAGINAL FISTULA CLOSURE W/ TAH     Past Medical History:  Diagnosis Date   AICD (automatic  cardioverter/defibrillator) present    high RV threshold chronically, device was turned off in 2014; Device battery has been dead x 7 years; turned it back on 06/22/2018   Anemia, iron  deficiency    Angioedema    felt to likely be due to ace inhibitors but says she has had this even off of medicines, appears to be tolerating ARBs chronically   Anxiety    Arthritis    hands, legs, arms; bad in my back (06/22/2018)   Asthmatic bronchitis with status asthmaticus    Bradycardia    CHF (congestive heart failure) (HCC)    Chronic lower back pain    Chronic pain    CKD (chronic kidney disease), stage III (HCC)    Coronary artery disease    Mild nonobstructive (30% LAD, 30% RCA) by 09/01/16 cath at [facility]   Degenerative joint disease    Depression    Diabetes mellitus, type 2 (HCC)    Diabetic peripheral neuropathy (HCC)    Dizziness    Dyspnea on exertion    Family history of adverse reaction to anesthesia    Mother has nausea   GERD (gastroesophageal reflux disease)    Gout    Heart valve disorder    History of blood transfusion 1981; ~ 2005; 12/2016   childbirth; defibrillator OR; colostomy OR   History of mononucleosis 03/2014   Hyperlipidemia    Hypertension    Hypothyroid    Insomnia    Intervertebral disc degeneration  Ischemic cardiomyopathy    LBBB (left bundle branch block)    Myocardial infarction [facility]) dx'd ~ 2005   Nonischemic cardiomyopathy (HCC)    s/p ICD in 2005. EF has since recovered   Obesity    On home oxygen  therapy    2L at night (06/22/2018)   OSA on CPAP    Pneumonia    couple times; last time was 12/2016 (06/22/2018)   PONV (postoperative nausea and vomiting)    Presence of permanent cardiac pacemaker    [employer]   Swelling    Syncope    Systemic hypertension    Vitamin D  deficiency    There were no vitals taken for this visit.  Opioid Risk Score:   Fall Risk Score:  `1  Depression screen [facility] 2/9      01/08/2024   12:46 PM 12/11/2023    1:47 PM 11/06/2023   10:53 AM 09/11/2023   10:15 AM 08/10/2023   10:54 AM 07/10/2023    9:59 AM 06/15/2023   11:10 AM  Depression screen PHQ 2/9  Decreased Interest 1 0 0 0 0 0 0  Down, Depressed, Hopeless 1 0 0 0 0 0 0  PHQ - 2 Score 2 0 0 0 0 0 0    Review of Systems  Musculoskeletal:  Positive for back pain, gait problem and neck pain.       Bilateral knee pain, right hand & wrist pain  All other systems reviewed and are negative.      Objective:   Physical Exam Vitals and nursing note reviewed.  Constitutional:      Appearance: Normal appearance.  Neck:     Comments: Cervical Paraspinal Tenderness: C-5-C-6 Cardiovascular:     Rate and Rhythm: Normal rate and regular rhythm.     Pulses: Normal pulses.     Heart sounds: Normal heart sounds.  Pulmonary:     Effort: Pulmonary effort is normal.     Breath sounds: Normal breath sounds.  Musculoskeletal:     Comments: Normal Muscle Bulk and Muscle Testing Reveals:  Upper Extremities:Full  ROM and Muscle Strength 5/5 Lumbar Paraspinal Tenderness: L-4-L-5 Lower Extremities: Bilateral Lower Extremities Flexion Produces Pain into her Bilateral Patella's Arises from Table slowly, using walker for support Antalgic  Gait     Skin:    General: Skin is warm and dry.  Neurological:     Mental Status: She is alert and oriented to person, place, and time.  Psychiatric:        Mood and Affect: Mood normal.        Behavior: Behavior normal.          Assessment & Plan:  Chronic Low Back Pain: Encouraged to increase HEP as Tolerated. Continue to Monitor. 02/08/2024 2. Bilateral Primary Osteoarthritis:  S/P Right Knee Genicular nerve radiofrequency neurotomy, with Dr Carilyn. On 08/02/2022 and S/P Left Knee Genicular Nerve Radiofrequency on 08/16/2022. With good relief noted.  : Indication Chronic post operative pain in the Knee, pain postop total knee replacement which has not responded to  conservative management such as physical therapy and medication management; Continue monitor. 02/08/2024 3. Myofascial Pain: Continue HEP as Tolerated. Continue to Monitor.  02/08/2024 4. Chronic Pain Syndrome:Refilled;  Oxycodone  7.5mg  /325 mg one tablet every 6 hours as needed for pain. Oxycodone  10 mg discontinued.  Discontinue Hydrocodone  due to itching,  We will continue the opioid monitoring program, this consists of regular clinic visits, examinations, urine drug screen, pill counts as well as  use of [place]  Controlled Substance Reporting system. A 12 month History has been reviewed on the [address]  Controlled Substance Reporting System on 02/08/2024. 5. Polyarthralgia: Continue HEP as tolerated. Continue current medication regimen. Continue to monitor. 02/08/2024 6. Chronic Bilateral Thoracic - Back Pain: no complaints today. Continue HEP as Tolerated. Continue to Monitor. 02/08/2024.    F/U in 1 month

## 2024-02-08 ENCOUNTER — Encounter: Attending: Registered Nurse | Admitting: Registered Nurse

## 2024-02-08 ENCOUNTER — Encounter: Payer: Self-pay | Admitting: Registered Nurse

## 2024-02-08 VITALS — BP 128/72 | HR 81 | Ht 64.0 in | Wt 246.0 lb

## 2024-02-08 DIAGNOSIS — M542 Cervicalgia: Secondary | ICD-10-CM | POA: Diagnosis present

## 2024-02-08 DIAGNOSIS — G894 Chronic pain syndrome: Secondary | ICD-10-CM | POA: Insufficient documentation

## 2024-02-08 DIAGNOSIS — Z5181 Encounter for therapeutic drug level monitoring: Secondary | ICD-10-CM | POA: Insufficient documentation

## 2024-02-08 DIAGNOSIS — M545 Low back pain, unspecified: Secondary | ICD-10-CM | POA: Diagnosis not present

## 2024-02-08 DIAGNOSIS — G8929 Other chronic pain: Secondary | ICD-10-CM | POA: Diagnosis present

## 2024-02-08 DIAGNOSIS — Z79891 Long term (current) use of opiate analgesic: Secondary | ICD-10-CM | POA: Insufficient documentation

## 2024-02-08 DIAGNOSIS — M170 Bilateral primary osteoarthritis of knee: Secondary | ICD-10-CM | POA: Diagnosis not present

## 2024-02-08 MED ORDER — OXYCODONE-ACETAMINOPHEN 7.5-325 MG PO TABS: 1.0000 | ORAL_TABLET | Freq: Four times a day (QID) | ORAL | 0 refills | Status: DC | PRN

## 2024-02-08 NOTE — Progress Notes (Signed)
 Remote ICD transmission.

## 2024-02-16 ENCOUNTER — Encounter: Payer: Self-pay | Admitting: Pulmonary Disease

## 2024-02-16 ENCOUNTER — Ambulatory Visit: Admitting: Pulmonary Disease

## 2024-02-16 VITALS — BP 122/60 | HR 77 | Ht 64.0 in | Wt 239.0 lb

## 2024-02-16 DIAGNOSIS — J4540 Moderate persistent asthma, uncomplicated: Secondary | ICD-10-CM

## 2024-02-16 NOTE — Progress Notes (Signed)
 @Patient  ID: Crystal [name], female    DOB: [birth year], 73 y.o.   MRN: [id number]  Chief Complaint  Patient presents with   Asthma Action Plan    Pt states all been well     Referring provider: Shayne Anes, MD  HPI:   73 y.o. woman here for evaluation of dyspnea on exertion.  Multiple PM&R are notes reviewed.  Most recent cardiology note reviewed.  Returns for routine follow-up.  Inhalers escalated to Breztri  at last visit.  Improvement in symptoms.  Better than prior inhalers.  She is excited about weight loss.  She is congratulated on this.  This likely is helping as well.  She feels as good as she has had in some time.  HPI at initial visit: History of nonischemic cardiomyopathy.  EF is gradually improved to normal after evidence-based medicine, cardiac treatment.  In general is short of breath.  With exertion.  Worse on inclines or stairs.  No time of day with any better or worse.  No position make it better or worse.  No seasonal or environmental factors she got to the hide on exam is better or worse.  Feels like this has been on Lasix  things are better.  Seems like sometimes when awake and lower extremity swelling fluctuates breathing worsens in the morning gets better and improves.  No relieving or exacerbating factors.  Diagnosed with asthma in the past.  Shortness of this come back.  Not on any inhalers.  She is on nocturnal oxygen .  Most recent chest x-ray 10/2021 reveals on my review and interpretation enlarged cardiac silhouette, otherwise clear lungs.  No lateral view so evaluation is limited.  Questionaires / Pulmonary Flowsheets:   ACT:  Asthma Control Test ACT Total Score  02/16/2024 10:09 AM 25    MMRC:     No data to display          Epworth:      No data to display          Tests:   FENO:  No results found for: NITRICOXIDE  PFT:    Latest Ref Rng & Units 05/25/2023    8:59 AM  PFT Results  FVC-Pre L 1.84   FVC-Predicted Pre % 63    FVC-Post L 1.86   FVC-Predicted Post % 64   Pre FEV1/FVC % % 73   Post FEV1/FCV % % 76   FEV1-Pre L 1.35   FEV1-Predicted Pre % 62   FEV1-Post L 1.40   DLCO uncorrected ml/min/mmHg 15.50   DLCO UNC% % 80   DLCO corrected ml/min/mmHg 15.50   DLCO COR %Predicted % 80   DLVA Predicted % 107   TLC L 4.10   TLC % Predicted % 81   RV % Predicted % 106   Personally reviewed interpret as spirometry suggestive of moderate restriction versus air trapping, no bronchodilator response.  No restriction on lung volumes, TLC within normal limits, elevated RV/TLC ratio consistent with air trapping.  DLCO within normal limits.  WALK:      No data to display          Imaging: Personally reviewed and as per EMR discussion this note No results found.  Lab Results: Personally reviewed CBC    Component Value Date/Time   WBC 11.1 (H) 10/28/2021 0809   RBC 4.43 10/28/2021 0809   HGB 10.4 (L) 10/28/2021 0809   HGB 11.9 09/24/2018 1052   HCT 34.5 (L) 10/28/2021 0809   HCT 37.0 09/24/2018 1052  PLT 274 10/28/2021 0809   PLT 208 09/24/2018 1052   MCV 77.9 (L) 10/28/2021 0809   MCV 84 09/24/2018 1052   MCH 23.5 (L) 10/28/2021 0809   MCHC 30.1 10/28/2021 0809   RDW 17.9 (H) 10/28/2021 0809   RDW 15.8 (H) 09/24/2018 1052   LYMPHSABS 2.2 10/27/2021 0905   LYMPHSABS 2.0 06/11/2018 1116   MONOABS 1.1 (H) 10/27/2021 0905   EOSABS 0.3 10/27/2021 0905   EOSABS 0.2 06/11/2018 1116   BASOSABS 0.1 10/27/2021 0905   BASOSABS 0.1 06/11/2018 1116    BMET    Component Value Date/Time   NA 139 11/25/2022 1439   K 4.2 11/25/2022 1439   CL 99 11/25/2022 1439   CO2 25 11/25/2022 1439   GLUCOSE 130 (H) 11/25/2022 1439   GLUCOSE 78 10/30/2021 0404   BUN 53 (H) 11/25/2022 1439   CREATININE 2.52 (H) 11/25/2022 1439   CREATININE 3.10 (H) 08/17/2017 1545   CALCIUM  9.0 11/25/2022 1439   GFRNONAA 35 (L) 10/30/2021 0404   GFRNONAA 15 (L) 08/17/2017 1545   GFRAA 32 (L) 01/08/2020 0618   GFRAA 17 (L)  08/17/2017 1545    BNP    Component Value Date/Time   BNP 50.2 08/07/2019 1732    ProBNP    Component Value Date/Time   PROBNP 948 (H) 05/23/2018 1440    Specialty Problems       Pulmonary Problems   Moderate persistent asthma    Allergies  Allergen Reactions   Ace Inhibitors Other (See Comments) and Swelling    Angioedema  Other reaction(s): swelling  Other reaction(s): Unknown  Other Reaction(s): Unknown   Glucophage [Metformin Hcl] Other (See Comments)    Renal failure   Telmisartan  Other (See Comments)    AVOID ARB/ ACEi in this patient due to recurrent AKI   Advicor [Niacin-Lovastatin Er] Other (See Comments)    Muscle aches   Atorvastatin Other (See Comments)    Muscle aches   Bystolic [Nebivolol Hcl] Swelling    Whole body swelled   Erythromycin Diarrhea, Nausea And Vomiting and Other (See Comments)    *DERIVATIVES*  Other reaction(s): severe vomiting  Other reaction(s): Unknown  Other Reaction(s): Unknown   Lopid [Gemfibrozil] Other (See Comments)    Muscle aches   Statins Other (See Comments)    Muscle aches tolerates crestor   Other reaction(s): Unknown  Other reaction(s): muscle aches  Other Reaction(s): Unknown   Erythromycin Base Nausea And Vomiting   Hydromorphone  Other (See Comments)    Confusion Other reaction(s): confusion.can take lower level narcs but those cause a lot of itching Other reaction(s): confusion.can take lower level narcs but those cause a lot of itching   Lisinopril    Losartan      Other reaction(s): stopped 12/2018. ? due to worse gfr Other reaction(s): stopped 12/2018. ? due to worse gfr Other reaction(s): stopped 12/2018. ? due to worse gfr Other reaction(s): stopped 12/2018. ? due to worse gfr   Nebivolol Hcl Swelling    Other reaction(s): swelling  Other reaction(s): Unknown  Other Reaction(s): Not available   Other     Other reaction(s): muscle aches   Sacubitril -Valsartan      Other reaction(s):  itching Other reaction(s): itching Other reaction(s): itching Other reaction(s): itching   Spironolactone      Other reaction(s): arf Other reaction(s): arf Other reaction(s): arf Other reaction(s): arf    Immunization History  Administered Date(s) Administered   Influenza, High Dose Seasonal PF 05/04/2018   Influenza,inj,Quad PF,6+ Mos 04/26/2017  Influenza-Unspecified 05/11/2016   PFIZER(Purple Top)SARS-COV-2 Vaccination 11/16/2019   PPD Test 06/10/2015   Pneumococcal Conjugate-13 11/17/2011   Pneumococcal Polysaccharide-23 05/07/2004, 07/14/2017   Td 09/19/2001   Zoster, Live 05/24/2012    Past Medical History:  Diagnosis Date   AICD (automatic cardioverter/defibrillator) present    high RV threshold chronically, device was turned off in 2014; Device battery has been dead x 7 years; turned it back on 06/22/2018   Anemia, iron  deficiency    Angioedema    felt to likely be due to ace inhibitors but says she has had this even off of medicines, appears to be tolerating ARBs chronically   Anxiety    Arthritis    hands, legs, arms; bad in my back (06/22/2018)   Asthmatic bronchitis with status asthmaticus    Bradycardia    CHF (congestive heart failure) (HCC)    Chronic lower back pain    Chronic pain    CKD (chronic kidney disease), stage III (HCC)    Coronary artery disease    Mild nonobstructive (30% LAD, 30% RCA) by 09/01/16 cath at [facility]   Degenerative joint disease    Depression    Diabetes mellitus, type 2 (HCC)    Diabetic peripheral neuropathy (HCC)    Dizziness    Dyspnea on exertion    Family history of adverse reaction to anesthesia    Mother has nausea   GERD (gastroesophageal reflux disease)    Gout    Heart valve disorder    History of blood transfusion 1981; ~ 2005; 12/2016   childbirth; defibrillator OR; colostomy OR   History of mononucleosis 03/2014   Hyperlipidemia    Hypertension    Hypothyroid    Insomnia    Intervertebral  disc degeneration    Ischemic cardiomyopathy    LBBB (left bundle branch block)    Myocardial infarction (HCC) dx'd ~ 2005   Nonischemic cardiomyopathy (HCC)    s/p ICD in 2005. EF has since recovered   Obesity    On home oxygen  therapy    2L at night (06/22/2018)   OSA on CPAP    Pneumonia    couple times; last time was 12/2016 (06/22/2018)   PONV (postoperative nausea and vomiting)    Presence of permanent cardiac pacemaker    [employer]   Swelling    Syncope    Systemic hypertension    Vitamin D  deficiency     Tobacco History: Social History   Tobacco Use  Smoking Status Never  Smokeless Tobacco Never   Counseling given: Not Answered   Continue to not smoke  Outpatient Encounter Medications as of 02/16/2024  Medication Sig   Accu-Chek FastClix Lancets MISC Apply topically 3 (three) times daily.   allopurinol  (ZYLOPRIM ) 100 MG tablet Take by mouth.   Budeson-Glycopyrrol-Formoterol (BREZTRI  AEROSPHERE) 160-9-4.8 MCG/ACT AERO Inhale 2 puffs into the lungs in the morning and at bedtime.   carvedilol  (COREG ) 25 MG tablet Take 25 mg by mouth 2 (two) times daily.    Cholecalciferol 50 MCG (2000 UT) TABS 1 capsule Orally Once a day   dapagliflozin propanediol (FARXIGA) 10 MG TABS tablet Take 10 mg by mouth daily.   DULoxetine  (CYMBALTA ) 60 MG capsule Take 60 mg by mouth daily.   febuxostat  (ULORIC ) 40 MG tablet Take 40 mg by mouth at bedtime.   fluconazole (DIFLUCAN) 150 MG tablet Take 150 mg by mouth as needed.   gabapentin  (NEURONTIN ) 300 MG capsule Take 300 mg by mouth 2 (two) times  daily.   hydrALAZINE  (APRESOLINE ) 25 MG tablet Take 1 tablet (25 mg total) by mouth in the morning and at bedtime.   Insulin  Regular Human (HUMULIN R  U-500 KWIKPEN [place]) Inject 130 Units into the skin 3 (three) times daily. Pt takes 130 units in the morning, and 145 units in the afternoon and night.   levothyroxine  (SYNTHROID , LEVOTHROID) 88 MCG tablet Take 88 mcg by mouth daily before  breakfast.    loratadine  (CLARITIN ) 10 MG tablet Take 10 mg by mouth daily.   montelukast  (SINGULAIR ) 10 MG tablet Take by mouth.   Omega-3 Fatty Acids (OMEGA 3 PO) Take 1 mg by mouth in the morning and at bedtime.   oxyCODONE -acetaminophen  (PERCOCET) 7.5-325 MG tablet Take 1 tablet by mouth every 6 (six) hours as needed for moderate pain (pain score 4-6).   OXYGEN  Inhale into the lungs.   pantoprazole  (PROTONIX ) 40 MG tablet Take 40 mg by mouth 2 (two) times daily.    rosuvastatin  (CRESTOR ) 5 MG tablet 2 tablet Orally Once a day   temazepam  (RESTORIL ) 15 MG capsule Take by mouth.   torsemide  (DEMADEX ) 20 MG tablet Take 20 mg by mouth 3 (three) times a week. Pt takes 20 mg 2 tablet on Mondays, Wednesdays, and Fridays.   No facility-administered encounter medications on file as of 02/16/2024.     Review of Systems  Review of Systems  N/a Physical Exam  BP 122/60   Pulse 77   Ht 5' 4 (1.626 m)   Wt 239 lb (108.4 kg)   SpO2 95%   BMI 41.02 kg/m   Wt Readings from Last 5 Encounters:  02/16/24 239 lb (108.4 kg)  02/08/24 246 lb (111.6 kg)  01/18/24 248 lb (112.5 kg)  01/08/24 244 lb (110.7 kg)  12/11/23 246 lb (111.6 kg)    BMI Readings from Last 5 Encounters:  02/16/24 41.02 kg/m  02/08/24 42.23 kg/m  01/18/24 42.57 kg/m  01/08/24 41.88 kg/m  12/11/23 42.23 kg/m     Physical Exam General: Sitting in chair, no acute distress Eyes: EOMI, no icterus Neck: Supple, no JVD. Pulmonary: Clear, normal work of breathing MSK: No synovitis, no joint effusion Neuro: Awake and alert, no focal deficits Psych normal mood, full affect   Assessment & Plan:   Dyspnea on exertion: Suspect multifactorial related to deconditioning, habitus.  Possible reemergence of asthma given history of the same.  Improves somewhat with diuretic therapy arguing for component of pulmonary venous hypertension related structural heart disease.  Notably LVEDP, wedge pressure was elevated on prior  catheterizations.  Group 2 pulmonary hypertension noted on right heart cath 05/2018, likely contributing.   Chest x-ray is clear.  PFTs with air trapping otherwise within normal limits.  Consistent with likely asthma.  Asthma: Diagnosed years ago.  Has not had active prescription inhaler in years.  With worsening dyspnea concern for possible asthma.  Breo too expensive but did not meet deductible.  Currently using Breztri  with improvement.  Makes sense given air trapping.  Continue Breztri . ACT score 25.   Return in about 1 year (around 02/15/2025) for f/u Dr. Annella.   Crystal JONELLE Annella, MD 02/16/2024

## 2024-02-16 NOTE — Patient Instructions (Signed)
 Nice to see you again  No changes to medication  I am glad you are doing well  Return to clinic in 1 year or sooner as needed with Dr. Annella

## 2024-03-19 ENCOUNTER — Encounter: Admitting: Registered Nurse

## 2024-03-22 ENCOUNTER — Encounter: Admitting: Registered Nurse

## 2024-03-22 ENCOUNTER — Ambulatory Visit (INDEPENDENT_AMBULATORY_CARE_PROVIDER_SITE_OTHER): Payer: Medicare Other

## 2024-03-22 DIAGNOSIS — I5022 Chronic systolic (congestive) heart failure: Secondary | ICD-10-CM | POA: Diagnosis not present

## 2024-03-26 ENCOUNTER — Other Ambulatory Visit: Payer: Self-pay | Admitting: Internal Medicine

## 2024-03-26 DIAGNOSIS — Z1231 Encounter for screening mammogram for malignant neoplasm of breast: Secondary | ICD-10-CM

## 2024-03-27 ENCOUNTER — Ambulatory Visit: Payer: Self-pay | Admitting: Cardiology

## 2024-03-27 LAB — CUP PACEART REMOTE DEVICE CHECK
Battery Remaining Longevity: 30 mo
Battery Remaining Percentage: 38 %
Brady Statistic RA Percent Paced: 0 %
Brady Statistic RV Percent Paced: 97 %
Date Time Interrogation Session: 20250831092000
HighPow Impedance: 67 Ohm
Lead Channel Impedance Value: 462 Ohm
Lead Channel Impedance Value: 506 Ohm
Lead Channel Impedance Value: 797 Ohm
Lead Channel Pacing Threshold Amplitude: 0.9 V
Lead Channel Pacing Threshold Pulse Width: 0.4 ms
Lead Channel Setting Pacing Amplitude: 2 V
Lead Channel Setting Pacing Amplitude: 2.5 V
Lead Channel Setting Pacing Amplitude: 4 V
Lead Channel Setting Pacing Pulse Width: 0.4 ms
Lead Channel Setting Pacing Pulse Width: 1 ms
Lead Channel Setting Sensing Sensitivity: 0.5 mV
Lead Channel Setting Sensing Sensitivity: 1 mV
Pulse Gen Serial Number: 227627

## 2024-03-28 ENCOUNTER — Encounter: Attending: Registered Nurse | Admitting: Registered Nurse

## 2024-03-28 ENCOUNTER — Encounter: Payer: Self-pay | Admitting: Registered Nurse

## 2024-03-28 ENCOUNTER — Ambulatory Visit
Admission: RE | Admit: 2024-03-28 | Discharge: 2024-03-28 | Disposition: A | Discharge status: Home / Self Care | Source: Ambulatory Visit

## 2024-03-28 VITALS — BP 108/63 | HR 74 | Ht 64.0 in | Wt 239.0 lb

## 2024-03-28 DIAGNOSIS — Z79891 Long term (current) use of opiate analgesic: Secondary | ICD-10-CM | POA: Insufficient documentation

## 2024-03-28 DIAGNOSIS — M545 Low back pain, unspecified: Secondary | ICD-10-CM | POA: Diagnosis not present

## 2024-03-28 DIAGNOSIS — G894 Chronic pain syndrome: Secondary | ICD-10-CM | POA: Insufficient documentation

## 2024-03-28 DIAGNOSIS — M542 Cervicalgia: Secondary | ICD-10-CM | POA: Insufficient documentation

## 2024-03-28 DIAGNOSIS — Z5181 Encounter for therapeutic drug level monitoring: Secondary | ICD-10-CM | POA: Diagnosis present

## 2024-03-28 DIAGNOSIS — G8929 Other chronic pain: Secondary | ICD-10-CM | POA: Diagnosis present

## 2024-03-28 DIAGNOSIS — M170 Bilateral primary osteoarthritis of knee: Secondary | ICD-10-CM | POA: Diagnosis present

## 2024-03-28 DIAGNOSIS — Z1231 Encounter for screening mammogram for malignant neoplasm of breast: Secondary | ICD-10-CM

## 2024-03-28 MED ORDER — OXYCODONE-ACETAMINOPHEN 7.5-325 MG PO TABS: 1.0000 | ORAL_TABLET | Freq: Four times a day (QID) | ORAL | 0 refills | Status: DC | PRN

## 2024-03-28 NOTE — Progress Notes (Signed)
 Subjective:    Patient ID: Crystal [name], female    DOB: 05/13/1951, 73 y.o.   MRN: [id number]  HPI: Crystal [name] is a 73 y.o. female who returns for follow up appointment for chronic pain and medication refill. She states her pain is located in her neck, lower back and bilateral knee pain. She rates her pain 7. Her current exercise regime is walking and performing stretching exercises.  Ms. Tessmer Morphine  equivalent is 45.00 MME.   Oral Swab was Performed today.     Pain Inventory Average Pain 9 Pain Right Now 7 My pain is sharp, burning, stabbing, and aching  In the last 24 hours, has pain interfered with the following? General activity 9 Relation with others 6 Enjoyment of life 7 What TIME of day is your pain at its worst? morning , evening, and night Sleep (in general) Good  Pain is worse with: walking, bending, standing, and some activites Pain improves with: rest and medication Relief from Meds: 7  Family History  Problem Relation Age of Onset   Arrhythmia Mother    Diabetes Mellitus II Mother        Borderline DM   Hypertension Mother    Asthma Mother    Hypertension Father    Heart disease Father    Prostate cancer Father    Hypertension Daughter    Hypertension Daughter    Diabetes Daughter    Hypertension Daughter    Atrial fibrillation Daughter    GER disease Daughter    Breast cancer Maternal Grandmother 67 - 43   Cancer Brother    Dementia Brother    Hypertension Brother    Hypertension Son    Anxiety disorder Son    Hypothyroidism Son    Heart disease Other        (Maternal side) Ischemic heart disease   Diabetes Mellitus II Other    Social History   Socioeconomic History   Marital status: Widowed    Spouse name: Not on file   Number of children: Not on file   Years of education: Not on file   Highest education level: Not on file  Occupational History    Comment: retired LPN  Tobacco Use   Smoking status: Never   Smokeless tobacco:  Never  Vaping Use   Vaping status: Never Used  Substance and Sexual Activity   Alcohol  use: Not Currently   Drug use: Never   Sexual activity: Not Currently  Other Topics Concern   Not on file  Social History Narrative   [place] in [place] alone.  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      As of 03/15/17:   Diet: 1800 Calorie      Caffeine: Yes      Married, if yes what year: Widowed, married 1973      Do you live in a house, apartment, assisted living, condo, trailer, ect: House, 1 stories, and 1 person      Pets: No      Current/Past profession: LPN, retired       Exercise: Yes, walking          Living Will: Yes   DNR: No   POA/HPOA: Yes      Functional Status:   Do you have difficulty bathing or dressing yourself? No   Do you have difficulty preparing food or eating? No   Do you have difficulty managing your medications?  No   Do you have difficulty managing your finances? No   Do you have difficulty affording your medications? Yes   Social Drivers of Corporate investment banker Strain: Low Risk  (10/11/2017)   Overall Financial Resource Strain (CARDIA)    Difficulty of Paying Living Expenses: Not hard at all  Food Insecurity: No Food Insecurity (07/06/2023)   Hunger Vital Sign    Worried About Running Out of Food in the Last Year: Never true    Ran Out of Food in the Last Year: Never true  Transportation Needs: No Transportation Needs (07/06/2023)   PRAPARE - Administrator, Civil Service (Medical): No    Lack of Transportation (Non-Medical): No  Physical Activity: Inactive (10/11/2017)   Exercise Vital Sign    Days of Exercise per Week: 0 days    Minutes of Exercise per Session: 0 min  Stress: Stress Concern Present (10/11/2017)   [name] - Occupational Stress Questionnaire    Feeling of Stress : To some extent  Social Connections: Somewhat Isolated (10/11/2017)    Social Connection and Isolation Panel    Frequency of Communication with Friends and Family: More than three times a week    Frequency of Social Gatherings with Friends and Family: More than three times a week    Attends Religious Services: More than 4 times per year    Active Member of [employer] or Organizations: No    Attends Banker Meetings: Never    Marital Status: Widowed   Past Surgical History:  Procedure Laterality Date   APPENDECTOMY  07/13/2017   BLADDER SUSPENSION  1990   w/hysterectomy   BOWEL RESECTION N/A 07/09/2018   Procedure: SMALL BOWEL RESECTION;  Surgeon: Kimble Agent, MD;  Location: [facility] OR;  Service: General;  Laterality: N/A;   CARDIAC CATHETERIZATION  2005   no obstructive CAD per patient   CARDIAC CATHETERIZATION  2018   CARDIAC DEFIBRILLATOR PLACEMENT  2005   BiV ICD implanted,  LV lead is an epicardial lead   CARPAL TUNNEL RELEASE Left 07/25/2014   Dr.Williamson    CARPAL TUNNEL RELEASE Right 04/08/2019   Procedure: RIGHT CARPAL TUNNEL RELEASE ENDOSCOPIC;  Surgeon: Sebastian Lenis, MD;  Location: [facility] OR;  Service: Orthopedics;  Laterality: Right;   CATARACT EXTRACTION W/ INTRAOCULAR LENS  IMPLANT, BILATERAL Bilateral    COLONIC STENT PLACEMENT N/A 08/31/2017   Procedure: COLONIC STENT PLACEMENT;  Surgeon: Rollin Dover, MD;  Location: [facility] ENDOSCOPY;  Service: Endoscopy;  Laterality: N/A;   COLONOSCOPY     2010-2011 Dr.Kipreos    COLOSTOMY  12/2016   thelbert 01/26/2017   COLOSTOMY REVERSAL N/A 07/13/2017   Procedure: COLOSTOMY REVERSAL;  Surgeon: Kimble Agent, MD;  Location: [facility] OR;  Service: General;  Laterality: N/A;   CYST REMOVAL HAND Right 05/2013   thumb   DILATION AND CURETTAGE OF UTERUS  X 5-6   ESOPHAGOGASTRODUODENOSCOPY (EGD) WITH PROPOFOL  N/A 03/13/2020   Procedure: ESOPHAGOGASTRODUODENOSCOPY (EGD) WITH PROPOFOL ;  Surgeon: Rollin Dover, MD;  Location: [facility] ENDOSCOPY;  Service: Endoscopy;  Laterality: N/A;   EXCISION MASS ABDOMINAL N/A  07/09/2018   Procedure: EXPLORATION OF  ABDOMINAL WOUND ERAS PATHWAY;  Surgeon: Kimble Agent, MD;  Location: [facility] OR;  Service: General;  Laterality: N/A;   FLEXIBLE SIGMOIDOSCOPY N/A 08/24/2017   Procedure: ENID MORIN;  Surgeon: Rollin Dover, MD;  Location: [facility] ENDOSCOPY;  Service: Endoscopy;  Laterality: N/A;   FLEXIBLE SIGMOIDOSCOPY N/A 08/31/2017   Procedure: FLEXIBLE SIGMOIDOSCOPY;  Surgeon:  Rollin Dover, MD;  Location: [facility] ENDOSCOPY;  Service: Endoscopy;  Laterality: N/A;  stent placement   FLEXIBLE SIGMOIDOSCOPY N/A 03/13/2020   Procedure: FLEXIBLE SIGMOIDOSCOPY;  Surgeon: Rollin Dover, MD;  Location: [facility] ENDOSCOPY;  Service: Endoscopy;  Laterality: N/A;   FLEXIBLE SIGMOIDOSCOPY N/A 12/18/2020   Procedure: FLEXIBLE SIGMOIDOSCOPY;  Surgeon: Rollin Dover, MD;  [facility]: [facility] ENDOSCOPY;  Service: Endoscopy;  Laterality: N/A;   HEMOSTASIS CLIP PLACEMENT  03/13/2020   Procedure: HEMOSTASIS CLIP PLACEMENT;  Surgeon: Rollin Dover, MD;  Location: [facility] ENDOSCOPY;  Service: Endoscopy;;   HEMOSTASIS CLIP PLACEMENT  12/18/2020   Procedure: HEMOSTASIS CLIP PLACEMENT;  Surgeon: Rollin Dover, MD;  [facility]: [facility] ENDOSCOPY;  Service: Endoscopy;;   HOT HEMOSTASIS N/A 03/13/2020   Procedure: HOT HEMOSTASIS (ARGON PLASMA COAGULATION/BICAP);  Surgeon: Rollin Dover, MD;  Location: [facility];  Service: Endoscopy;  Laterality: N/A;   HOT HEMOSTASIS N/A 12/18/2020   Procedure: HOT HEMOSTASIS (ARGON PLASMA COAGULATION/BICAP);  Surgeon: Rollin Dover, MD;  Location: [facility];  Service: Endoscopy;  Laterality: N/A;   IMPLANTABLE CARDIOVERTER DEFIBRILLATOR GENERATOR CHANGE  2008   INSERTION OF MESH N/A 07/09/2018   Procedure: INSERTION OF VICRYL MESH;  Surgeon: Kimble Agent, MD;  Location: [facility] OR;  Service: General;  Laterality: N/A;   LAPAROTOMY N/A 09/18/2017   Procedure: EXPLORATORY LAPAROTOMY;  Surgeon: Kimble Agent, MD;  Location: [facility] OR;  Service: General;  Laterality: N/A;   LEAD REVISION  06/22/2018    LEAD REVISION/REPAIR N/A 06/22/2018   Procedure: LEAD REVISION/REPAIR;  Surgeon: Fernande Elspeth BROCKS, MD;  Location: [facility] INVASIVE CV LAB;  Service: Cardiovascular;  Laterality: N/A;   LYSIS OF ADHESION N/A 09/18/2017   Procedure: LYSIS OF ADHESION;  Surgeon: Kimble Agent, MD;  Location: [facility] OR;  Service: General;  Laterality: N/A;   LYSIS OF ADHESION N/A 07/09/2018   Procedure: LYSIS OF ADHESION;  Surgeon: Kimble Agent, MD;  Location: [facility] OR;  Service: General;  Laterality: N/A;   PARTIAL COLECTOMY N/A 09/18/2017   Procedure: ILEOCOLECTOMY;  Surgeon: Kimble Agent, MD;  Location: [facility] OR;  Service: General;  Laterality: N/A;   PARTIAL COLECTOMY  09/25/2017   POLYPECTOMY  03/13/2020   Procedure: POLYPECTOMY;  Surgeon: Rollin Dover, MD;  Location[facility] ENDOSCOPY;  Service: Endoscopy;;   RIGHT/LEFT HEART CATH AND CORONARY ANGIOGRAPHY N/A 06/01/2018   Procedure: RIGHT/LEFT HEART CATH AND CORONARY ANGIOGRAPHY;  Surgeon: Dann Candyce RAMAN, MD;  Location: [facility] INVASIVE CV LAB;  Service: Cardiovascular;  Laterality: N/A;   SIGMOIDOSCOPY N/A 09/18/2017   Procedure: SIGMOIDOSCOPY;  Surgeon: Kimble Agent, MD;  Location: [facility] OR;  Service: General;  Laterality: N/A;   SMALL INTESTINE SURGERY  07/09/2018   EXPLORATION OF  ABDOMINAL WOUND ERAS PATHWAYN/; MESH; LYSIS OF ADHESIONS   TOTAL ABDOMINAL HYSTERECTOMY  1990   TRANSFORAMINAL LUMBAR INTERBODY FUSION (TLIF) WITH PEDICLE SCREW FIXATION 2 LEVEL N/A 01/07/2020   Procedure: Lumbar Four-Five and Lumbar Five-Sacral One open lumbar decompression and transforaminal lumbar interbody fusion;  Surgeon: Cheryle Debby LABOR, MD;  Location: [facility] OR;  Service: Neurosurgery;  Laterality: N/A;  Lumbar Four-Five and Lumbar Five-Sacral One open lumbar decompression and transforaminal lumbar interbody fusion   TRIGGER FINGER RELEASE Right 05/213   TRIGGER FINGER RELEASE Right 04/08/2019   Procedure: RIGHT INDEX RELEASE TRIGGER FINGER/A-1 PULLEY;  Surgeon: Sebastian Lenis, MD;  Location: [facility]  OR;  Service: Orthopedics;  Laterality: Right;   TUBAL LIGATION  1980s   VESICOVAGINAL FISTULA CLOSURE W/ TAH     Past Surgical History:  Procedure Laterality Date   APPENDECTOMY  07/13/2017   BLADDER SUSPENSION  1990   w/hysterectomy   BOWEL RESECTION N/A 07/09/2018   Procedure: SMALL BOWEL RESECTION;  Surgeon: Kimble Agent, MD;  [facility];  Service: General;  Laterality: N/A;   CARDIAC CATHETERIZATION  2005   no obstructive CAD per patient   CARDIAC CATHETERIZATION  2018   CARDIAC DEFIBRILLATOR PLACEMENT  2005   BiV ICD implanted,  LV lead is an epicardial lead   CARPAL TUNNEL RELEASE Left 07/25/2014   Dr.Williamson    CARPAL TUNNEL RELEASE Right 04/08/2019   Procedure: RIGHT CARPAL TUNNEL RELEASE ENDOSCOPIC;  Surgeon: Sebastian Lenis, MD;  Location: [facility] OR;  Service: Orthopedics;  Laterality: Right;   CATARACT EXTRACTION W/ INTRAOCULAR LENS  IMPLANT, BILATERAL Bilateral    COLONIC STENT PLACEMENT N/A 08/31/2017   Procedure: COLONIC STENT PLACEMENT;  Surgeon: Rollin Dover, MD;  Location: [facility] ENDOSCOPY;  Service: Endoscopy;  Laterality: N/A;   COLONOSCOPY     2010-2011 Dr.Kipreos    COLOSTOMY  12/2016   thelbert 01/26/2017   COLOSTOMY REVERSAL N/A 07/13/2017   Procedure: COLOSTOMY REVERSAL;  Surgeon: Kimble Agent, MD;  [facility] OR;  Service: General;  Laterality: N/A;   CYST REMOVAL HAND Right 05/2013   thumb   DILATION AND CURETTAGE OF UTERUS  X 5-6   ESOPHAGOGASTRODUODENOSCOPY (EGD) WITH PROPOFOL  N/A 03/13/2020   Procedure: ESOPHAGOGASTRODUODENOSCOPY (EGD) WITH PROPOFOL ;  Surgeon: Rollin Dover, MD;  Location[facility] ENDOSCOPY;  Service: Endoscopy;  Laterality: N/A;   EXCISION MASS ABDOMINAL N/A 07/09/2018   Procedure: EXPLORATION OF  ABDOMINAL WOUND ERAS PATHWAY;  Surgeon: Kimble Agent, MD;  Location: [facility] OR;  Service: General;  Laterality: N/A;   FLEXIBLE SIGMOIDOSCOPY N/A 08/24/2017   Procedure: ENID MORIN;  Surgeon: Rollin Dover, MD;  Location: [facility] ENDOSCOPY;   Service: Endoscopy;  Laterality: N/A;   FLEXIBLE SIGMOIDOSCOPY N/A 08/31/2017   Procedure: FLEXIBLE SIGMOIDOSCOPY;  Surgeon: Rollin Dover, MD;  Location: [facility] ENDOSCOPY;  Service: Endoscopy;  Laterality: N/A;  stent placement   FLEXIBLE SIGMOIDOSCOPY N/A 03/13/2020   Procedure: FLEXIBLE SIGMOIDOSCOPY;  Surgeon: Rollin Dover, MD;  [facility] ENDOSCOPY;  Service: Endoscopy;  Laterality: N/A;   FLEXIBLE SIGMOIDOSCOPY N/A 12/18/2020   Procedure: FLEXIBLE SIGMOIDOSCOPY;  Surgeon: Rollin Dover, MD;  Location: [facility] ENDOSCOPY;  Service: Endoscopy;  Laterality: N/A;   HEMOSTASIS CLIP PLACEMENT  03/13/2020   Procedure: HEMOSTASIS CLIP PLACEMENT;  Surgeon: Rollin Dover, MD;  Location: [facility] ENDOSCOPY;  Service: Endoscopy;;   HEMOSTASIS CLIP PLACEMENT  12/18/2020   Procedure: HEMOSTASIS CLIP PLACEMENT;  Surgeon: Rollin Dover, MD;  Location: [facility] ENDOSCOPY;  Service: Endoscopy;;   HOT HEMOSTASIS N/A 03/13/2020   Procedure: HOT HEMOSTASIS (ARGON PLASMA COAGULATION/BICAP);  Surgeon: Rollin Dover, MD;  Location: [facility];  Service: Endoscopy;  Laterality: N/A;   HOT HEMOSTASIS N/A 12/18/2020   Procedure: HOT HEMOSTASIS (ARGON PLASMA COAGULATION/BICAP);  Surgeon: Rollin Dover, MD;  Location: [facility];  Service: Endoscopy;  Laterality: N/A;   IMPLANTABLE CARDIOVERTER DEFIBRILLATOR GENERATOR CHANGE  2008   INSERTION OF MESH N/A 07/09/2018   Procedure: INSERTION OF VICRYL MESH;  Surgeon: Kimble Agent, MD;  Location: [facility] OR;  Service: General;  Laterality: N/A;   LAPAROTOMY N/A 09/18/2017   Procedure: EXPLORATORY LAPAROTOMY;  Surgeon: Kimble Agent, MD;  Location: [facility] OR;  Service: General;  Laterality: N/A;   LEAD REVISION  06/22/2018   LEAD REVISION/REPAIR N/A 06/22/2018   Procedure: LEAD REVISION/REPAIR;  Surgeon: Fernande Elspeth BROCKS, MD;  Location: [facility] INVASIVE CV LAB;  Service: Cardiovascular;  Laterality: N/A;   LYSIS  OF ADHESION N/A 09/18/2017   Procedure: LYSIS OF ADHESION;  Surgeon: Kimble Agent, MD;  Location: [facility]  OR;  Service: General;  Laterality: N/A;   LYSIS OF ADHESION N/A 07/09/2018   Procedure: LYSIS OF ADHESION;  Surgeon: Kimble Agent, MD;  Location: [facility] OR;  Service: General;  Laterality: N/A;   PARTIAL COLECTOMY N/A 09/18/2017   Procedure: ILEOCOLECTOMY;  Surgeon: Kimble Agent, MD;  Location: [facility] OR;  Service: General;  Laterality: N/A;   PARTIAL COLECTOMY  09/25/2017   POLYPECTOMY  03/13/2020   Procedure: POLYPECTOMY;  Surgeon: Rollin Dover, MD;  Location: [facility] ENDOSCOPY;  Service: Endoscopy;;   RIGHT/LEFT HEART CATH AND CORONARY ANGIOGRAPHY N/A 06/01/2018   Procedure: RIGHT/LEFT HEART CATH AND CORONARY ANGIOGRAPHY;  Surgeon: Dann Candyce RAMAN, MD;  Location: [facility] INVASIVE CV LAB;  Service: Cardiovascular;  Laterality: N/A;   SIGMOIDOSCOPY N/A 09/18/2017   Procedure: SIGMOIDOSCOPY;  Surgeon: Kimble Agent, MD;  Location: [facility] OR;  Service: General;  Laterality: N/A;   SMALL INTESTINE SURGERY  07/09/2018   EXPLORATION OF  ABDOMINAL WOUND ERAS PATHWAYN/; MESH; LYSIS OF ADHESIONS   TOTAL ABDOMINAL HYSTERECTOMY  1990   TRANSFORAMINAL LUMBAR INTERBODY FUSION (TLIF) WITH PEDICLE SCREW FIXATION 2 LEVEL N/A 01/07/2020   Procedure: Lumbar Four-Five and Lumbar Five-Sacral One open lumbar decompression and transforaminal lumbar interbody fusion;  Surgeon: Cheryle Debby LABOR, MD;  Location: [facility] OR;  Service: Neurosurgery;  Laterality: N/A;  Lumbar Four-Five and Lumbar Five-Sacral One open lumbar decompression and transforaminal lumbar interbody fusion   TRIGGER FINGER RELEASE Right 05/213   TRIGGER FINGER RELEASE Right 04/08/2019   Procedure: RIGHT INDEX RELEASE TRIGGER FINGER/A-1 PULLEY;  Surgeon: Sebastian Lenis, MD;  Location: [facility] OR;  Service: Orthopedics;  Laterality: Right;   TUBAL LIGATION  1980s   VESICOVAGINAL FISTULA CLOSURE W/ TAH     Past Medical History:  Diagnosis Date   AICD (automatic cardioverter/defibrillator) present    high RV threshold chronically, device was turned off in 2014; Device  battery has been dead x 7 years; turned it back on 06/22/2018   Anemia, iron  deficiency    Angioedema    felt to likely be due to ace inhibitors but says she has had this even off of medicines, appears to be tolerating ARBs chronically   Anxiety    Arthritis    hands, legs, arms; bad in my back (06/22/2018)   Asthmatic bronchitis with status asthmaticus    Bradycardia    CHF (congestive heart failure) (HCC)    Chronic lower back pain    Chronic pain    CKD (chronic kidney disease), stage III (HCC)    Coronary artery disease    Mild nonobstructive (30% LAD, 30% RCA) by 09/01/16 cath at [facility]   Degenerative joint disease    Depression    Diabetes mellitus, type 2 (HCC)    Diabetic peripheral neuropathy (HCC)    Dizziness    Dyspnea on exertion    Family history of adverse reaction to anesthesia    Mother has nausea   GERD (gastroesophageal reflux disease)    Gout    Heart valve disorder    History of blood transfusion 1981; ~ 2005; 12/2016   childbirth; defibrillator OR; colostomy OR   History of mononucleosis 03/2014   Hyperlipidemia    Hypertension    Hypothyroid    Insomnia    Intervertebral disc degeneration    Ischemic cardiomyopathy    LBBB (left bundle branch block)    Myocardial infarction (HCC) dx'd ~ 2005  Nonischemic cardiomyopathy (HCC)    s/p ICD in 2005. EF has since recovered   Obesity    On home oxygen  therapy    2L at night (06/22/2018)   OSA on CPAP    Pneumonia    couple times; last time was 12/2016 (06/22/2018)   PONV (postoperative nausea and vomiting)    Presence of permanent cardiac pacemaker    [employer]   Swelling    Syncope    Systemic hypertension    Vitamin D  deficiency    BP 108/63 (BP Location: Left Arm, Patient Position: Sitting, Cuff Size: Large)   Pulse 74   Ht 5' 4 (1.626 m)   Wt 239 lb (108.4 kg)   SpO2 93%   BMI 41.02 kg/m   Opioid Risk Score:   Fall Risk Score:  `1  Depression screen [facility]  2/9     03/28/2024    2:01 PM 02/08/2024   11:56 AM 01/08/2024   12:46 PM 12/11/2023    1:47 PM 11/06/2023   10:53 AM 09/11/2023   10:15 AM 08/10/2023   10:54 AM  Depression screen PHQ 2/9  Decreased Interest 0 0 1 0 0 0 0  Down, Depressed, Hopeless 0 0 1 0 0 0 0  PHQ - 2 Score 0 0 2 0 0 0 0    Review of Systems  Musculoskeletal:  Positive for arthralgias, back pain, myalgias and neck pain.       Some neck pain, low back pain, bilateral knee pain, right wrist and hand pain  All other systems reviewed and are negative.      Objective:   Physical Exam Vitals and nursing note reviewed.  Constitutional:      Appearance: Normal appearance.  Cardiovascular:     Rate and Rhythm: Normal rate and regular rhythm.     Pulses: Normal pulses.     Heart sounds: Normal heart sounds.  Pulmonary:     Effort: Pulmonary effort is normal.     Breath sounds: Normal breath sounds.  Musculoskeletal:     Comments: Normal Muscle Bulk and Muscle Testing Reveals:  Upper Extremities: Full ROM and Muscle Strength 5/5  Thoracic Paraspinal Tenderness: T-1-T-2 Lumbar Paraspinal Tenderness: L-3-L-5 Lower Extremities: Full ROM and Muscle Strength 5/5 Arises from Table with ease Narrow Based  Gait     Skin:    General: Skin is warm and dry.  Neurological:     Mental Status: She is alert and oriented to person, place, and time.  Psychiatric:        Mood and Affect: Mood normal.        Behavior: Behavior normal.          Assessment & Plan:  Chronic Low Back Pain: Encouraged to increase HEP as Tolerated. Continue to Monitor. 03/28/2024 2. Bilateral Primary Osteoarthritis:  S/P Right Knee Genicular nerve radiofrequency neurotomy, with Dr Carilyn. On 08/02/2022 and S/P Left Knee Genicular Nerve Radiofrequency on 03/28/2024. With good relief noted.  : Indication Chronic post operative pain in the Knee, pain postop total knee replacement which has not responded to conservative management such as physical  therapy and medication management; Continue monitor. 03/28/2024 3. Myofascial Pain: Continue HEP as Tolerated. Continue to Monitor.  03/28/2024 4. Chronic Pain Syndrome:Refilled;  Oxycodone  7.5mg  /325 mg one tablet every 6 hours as needed for pain. Oxycodone  10 mg discontinued.  Discontinue Hydrocodone  due to itching,  We will continue the opioid monitoring program, this consists of regular clinic visits, examinations, urine drug  screen, pill counts as well as use of [place]  Controlled Substance Reporting system. A 12 month History has been reviewed on the [place]  Controlled Substance Reporting System on 03/28/2024. 5. Polyarthralgia: Continue HEP as tolerated. Continue current medication regimen. Continue to monitor. 03/28/2024 6. Chronic Bilateral Thoracic - Back Pain: no complaints today. Continue HEP as Tolerated. Continue to Monitor. 03/28/2024.    F/U in 1 month

## 2024-03-31 LAB — DRUG TOX MONITOR 1 W/CONF, ORAL FLD
Amphetamines: NEGATIVE ng/mL (ref <10)
Barbiturates: NEGATIVE ng/mL (ref <10)
Benzodiazepines: NEGATIVE ng/mL (ref <0.50)
Buprenorphine: NEGATIVE ng/mL (ref <0.10)
Cocaine: NEGATIVE ng/mL (ref <5.0)
Codeine: NEGATIVE ng/mL (ref <2.5)
Dihydrocodeine: NEGATIVE ng/mL (ref <2.5)
Fentanyl: NEGATIVE ng/mL (ref <0.10)
Heroin Metabolite: NEGATIVE ng/mL (ref <1.0)
Hydrocodone: NEGATIVE ng/mL (ref <2.5)
Hydromorphone: NEGATIVE ng/mL (ref <2.5)
MARIJUANA: NEGATIVE ng/mL (ref <2.5)
MDMA: NEGATIVE ng/mL (ref <10)
Meprobamate: NEGATIVE ng/mL (ref <2.5)
Methadone: NEGATIVE ng/mL (ref <5.0)
Morphine: NEGATIVE ng/mL (ref <2.5)
Nicotine Metabolite: NEGATIVE ng/mL (ref <5.0)
Norhydrocodone: NEGATIVE ng/mL (ref <2.5)
Noroxycodone: NEGATIVE ng/mL (ref <2.5)
Opiates: POSITIVE ng/mL — AB (ref <2.5)
Oxycodone: 23.3 ng/mL — ABNORMAL HIGH (ref <2.5)
Oxymorphone: NEGATIVE ng/mL (ref <2.5)
Phencyclidine: NEGATIVE ng/mL (ref <10)
Tapentadol: NEGATIVE ng/mL (ref <5.0)
Tramadol: NEGATIVE ng/mL (ref <5.0)
Zolpidem: NEGATIVE ng/mL (ref <5.0)

## 2024-03-31 LAB — DRUG TOX ALC METAB W/CON, ORAL FLD: Alcohol Metabolite: NEGATIVE ng/mL (ref <25)

## 2024-04-01 NOTE — Progress Notes (Signed)
Remote ICD Transmission.

## 2024-04-12 ENCOUNTER — Telehealth (HOSPITAL_COMMUNITY): Payer: Self-pay | Admitting: Pharmacy Technician

## 2024-04-12 ENCOUNTER — Other Ambulatory Visit (HOSPITAL_COMMUNITY): Payer: Self-pay | Admitting: Internal Medicine

## 2024-04-12 NOTE — Telephone Encounter (Signed)
 Auth Submission: NO AUTH NEEDED Site of care: [facility] Payer: [employer] Medicare Medication & CPT/J Code(s) submitted: Feraheme  (ferumoxytol ) [id number] Diagnosis Code: D50.8 Route of submission (phone, fax, portal):  Phone # Fax # Auth type: Buy/Bill HB Units/visits requested: 510mg  x 2 doses Reference number: [id number] Approval from: 04/12/24 to 07/24/24    Dagoberto Armour, CPhT [facility] Phone: [phone] 04/12/2024

## 2024-04-19 ENCOUNTER — Encounter (HOSPITAL_COMMUNITY)
Admission: RE | Admit: 2024-04-19 | Discharge: 2024-04-19 | Disposition: A | Discharge status: Home / Self Care | Source: Ambulatory Visit | Attending: Internal Medicine | Admitting: Internal Medicine

## 2024-04-19 VITALS — BP 112/54 | HR 63 | Temp 97.7°F | Resp 17

## 2024-04-19 DIAGNOSIS — D508 Other iron deficiency anemias: Secondary | ICD-10-CM | POA: Diagnosis present

## 2024-04-19 MED ORDER — SODIUM CHLORIDE 0.9 % IV SOLN
510.0000 mg | Freq: Once | INTRAVENOUS | Status: AC
  Administered 2024-04-19: 510 mg via INTRAVENOUS
  Filled 2024-04-19: qty 17

## 2024-04-26 ENCOUNTER — Ambulatory Visit (HOSPITAL_COMMUNITY)
Admission: RE | Admit: 2024-04-26 | Discharge: 2024-04-26 | Disposition: A | Discharge status: Home / Self Care | Source: Ambulatory Visit | Attending: Internal Medicine | Admitting: Internal Medicine

## 2024-04-26 VITALS — BP 111/51 | HR 55 | Temp 98.2°F | Resp 16

## 2024-04-26 DIAGNOSIS — D508 Other iron deficiency anemias: Secondary | ICD-10-CM | POA: Diagnosis present

## 2024-04-26 MED ORDER — SODIUM CHLORIDE 0.9 % IV SOLN
510.0000 mg | Freq: Once | INTRAVENOUS | Status: AC
  Administered 2024-04-26: 510 mg via INTRAVENOUS
  Filled 2024-04-26: qty 510

## 2024-05-14 ENCOUNTER — Encounter: Payer: Self-pay | Admitting: Registered Nurse

## 2024-05-14 ENCOUNTER — Encounter: Attending: Registered Nurse | Admitting: Registered Nurse

## 2024-05-14 VITALS — BP 109/64 | HR 77 | Ht 64.0 in | Wt 236.8 lb

## 2024-05-14 DIAGNOSIS — M542 Cervicalgia: Secondary | ICD-10-CM | POA: Insufficient documentation

## 2024-05-14 DIAGNOSIS — Z79891 Long term (current) use of opiate analgesic: Secondary | ICD-10-CM | POA: Insufficient documentation

## 2024-05-14 DIAGNOSIS — M545 Low back pain, unspecified: Secondary | ICD-10-CM | POA: Diagnosis not present

## 2024-05-14 DIAGNOSIS — G8929 Other chronic pain: Secondary | ICD-10-CM | POA: Diagnosis present

## 2024-05-14 DIAGNOSIS — M170 Bilateral primary osteoarthritis of knee: Secondary | ICD-10-CM | POA: Insufficient documentation

## 2024-05-14 DIAGNOSIS — Z5181 Encounter for therapeutic drug level monitoring: Secondary | ICD-10-CM | POA: Diagnosis present

## 2024-05-14 DIAGNOSIS — G894 Chronic pain syndrome: Secondary | ICD-10-CM | POA: Insufficient documentation

## 2024-05-14 MED ORDER — OXYCODONE-ACETAMINOPHEN 7.5-325 MG PO TABS: 1.0000 | ORAL_TABLET | Freq: Four times a day (QID) | ORAL | 0 refills | Status: DC | PRN

## 2024-05-14 NOTE — Progress Notes (Signed)
 Subjective:    Patient ID: Crystal [name], female    DOB: 1951/07/09, 73 y.o.   MRN: [id number]  HPI: Crystal [name] is a 73 y.o. female who returns for follow up appointment for chronic pain and medication refill. She states her pain is located in her neck, lower back and bilateral knee pain. She rates her pain 6. Her current exercise regime is walking and performing stretching exercises.  Ms. Pasion Morphine  equivalent is 45.00 MME.   Last Oral Swab was Performed on 03/28/2024, it was consistent.    Pain Inventory Average Pain 9 Pain Right Now 6 My pain is constant, sharp, stabbing, and aching  In the last 24 hours, has pain interfered with the following? General activity 7 Relation with others 6 Enjoyment of life 7 What TIME of day is your pain at its worst? morning , evening, and night Sleep (in general) Good  Pain is worse with: walking, bending, standing, and some activites Pain improves with: rest and medication Relief from Meds: 9  Family History  Problem Relation Age of Onset   Arrhythmia Mother    Diabetes Mellitus II Mother        Borderline DM   Hypertension Mother    Asthma Mother    Hypertension Father    Heart disease Father    Prostate cancer Father    Hypertension Daughter    Hypertension Daughter    Diabetes Daughter    Hypertension Daughter    Atrial fibrillation Daughter    GER disease Daughter    Breast cancer Maternal Grandmother 34 - 28   Cancer Brother    Dementia Brother    Hypertension Brother    Hypertension Son    Anxiety disorder Son    Hypothyroidism Son    Heart disease Other        (Maternal side) Ischemic heart disease   Diabetes Mellitus II Other    Social History   Socioeconomic History   Marital status: Widowed    Spouse name: Not on file   Number of children: Not on file   Years of education: Not on file   Highest education level: Not on file  Occupational History    Comment: retired LPN  Tobacco Use   Smoking status:  Never   Smokeless tobacco: Never  Vaping Use   Vaping status: Never Used  Substance and Sexual Activity   Alcohol  use: Not Currently   Drug use: Never   Sexual activity: Not Currently  Other Topics Concern   Not on file  Social History Narrative   [place].  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      As of 03/15/17:   Diet: 1800 Calorie      Caffeine: Yes      Married, if yes what year: Widowed, married 1973      Do you live in a house, apartment, assisted living, condo, trailer, ect: House, 1 stories, and 1 person      Pets: No      Current/Past profession: LPN, retired       Exercise: Yes, walking          Living Will: Yes   DNR: No   POA/HPOA: Yes      Functional Status:   Do you have difficulty bathing or dressing yourself? No   Do you have difficulty preparing food or eating? No   Do you have  difficulty managing your medications? No   Do you have difficulty managing your finances? No   Do you have difficulty affording your medications? Yes   Social Drivers of Corporate investment banker Strain: Low Risk  (10/11/2017)   Overall Financial Resource Strain (CARDIA)    Difficulty of Paying Living Expenses: Not hard at all  Food Insecurity: No Food Insecurity (07/06/2023)   Hunger Vital Sign    Worried About Running Out of Food in the Last Year: Never true    Ran Out of Food in the Last Year: Never true  Transportation Needs: No Transportation Needs (07/06/2023)   PRAPARE - Administrator, Civil Service (Medical): No    Lack of Transportation (Non-Medical): No  Physical Activity: Inactive (10/11/2017)   Exercise Vital Sign    Days of Exercise per Week: 0 days    Minutes of Exercise per Session: 0 min  Stress: Stress Concern Present (10/11/2017)   [name] - Occupational Stress Questionnaire    Feeling of Stress : To some extent  Social Connections:  Somewhat Isolated (10/11/2017)   Social Connection and Isolation Panel    Frequency of Communication with Friends and Family: More than three times a week    Frequency of Social Gatherings with Friends and Family: More than three times a week    Attends Religious Services: More than 4 times per year    Active Member of [employer] or Organizations: No    Attends Banker Meetings: Never    Marital Status: Widowed   Past Surgical History:  Procedure Laterality Date   APPENDECTOMY  07/13/2017   BLADDER SUSPENSION  1990   w/hysterectomy   BOWEL RESECTION N/A 07/09/2018   Procedure: SMALL BOWEL RESECTION;  Surgeon: Kimble Agent, MD;  [facility];  Service: General;  Laterality: N/A;   CARDIAC CATHETERIZATION  2005   no obstructive CAD per patient   CARDIAC CATHETERIZATION  2018   CARDIAC DEFIBRILLATOR PLACEMENT  2005   BiV ICD implanted,  LV lead is an epicardial lead   CARPAL TUNNEL RELEASE Left 07/25/2014   Dr.Williamson    CARPAL TUNNEL RELEASE Right 04/08/2019   Procedure: RIGHT CARPAL TUNNEL RELEASE ENDOSCOPIC;  Surgeon: Sebastian Lenis, MD;  Location: [facility] OR;  Service: Orthopedics;  Laterality: Right;   CATARACT EXTRACTION W/ INTRAOCULAR LENS  IMPLANT, BILATERAL Bilateral    COLONIC STENT PLACEMENT N/A 08/31/2017   Procedure: COLONIC STENT PLACEMENT;  Surgeon: Rollin Dover, MD;  Location: [facility] ENDOSCOPY;  Service: Endoscopy;  Laterality: N/A;   COLONOSCOPY     2010-2011 Dr.Kipreos    COLOSTOMY  12/2016   thelbert 01/26/2017   COLOSTOMY REVERSAL N/A 07/13/2017   Procedure: COLOSTOMY REVERSAL;  Surgeon: Kimble Agent, MD;  [facility];  Service: General;  Laterality: N/A;   CYST REMOVAL HAND Right 05/2013   thumb   DILATION AND CURETTAGE OF UTERUS  X 5-6   ESOPHAGOGASTRODUODENOSCOPY (EGD) WITH PROPOFOL  N/A 03/13/2020   Procedure: ESOPHAGOGASTRODUODENOSCOPY (EGD) WITH PROPOFOL ;  Surgeon: Rollin Dover, MD;  [facility] ENDOSCOPY;  Service: Endoscopy;  Laterality: N/A;    EXCISION MASS ABDOMINAL N/A 07/09/2018   Procedure: EXPLORATION OF  ABDOMINAL WOUND ERAS PATHWAY;  Surgeon: Kimble Agent, MD;  Location: [facility] OR;  Service: General;  Laterality: N/A;   FLEXIBLE SIGMOIDOSCOPY N/A 08/24/2017   Procedure: ENID MORIN;  Surgeon: Rollin Dover, MD;  Location: [facility] ENDOSCOPY;  Service: Endoscopy;  Laterality: N/A;   FLEXIBLE SIGMOIDOSCOPY N/A 08/31/2017   Procedure:  FLEXIBLE SIGMOIDOSCOPY;  Surgeon: Rollin Dover, MD;  Location: [facility] ENDOSCOPY;  Service: Endoscopy;  Laterality: N/A;  stent placement   FLEXIBLE SIGMOIDOSCOPY N/A 03/13/2020   Procedure: FLEXIBLE SIGMOIDOSCOPY;  Surgeon: Rollin Dover, MD;  [facility] ENDOS[facility];  Service: Endoscopy;  Laterality: N/A;   FLEXIBLE SIGMOIDOSCOPY N/A 12/18/2020   Procedure: FLEXIBLE SIGMOIDOSCOPY;  Surgeon: Rollin Dover, MD;  [facility] ENDOS[facility];  Service: Endoscopy;  Laterality: N/A;   HEMOSTASIS CLIP PLACEMENT  03/13/2020   Procedure: HEMOSTASIS CLIP PLACEMENT;  Surgeon: Rollin Dover, MD;  [facility] ENDOS[facility];  Service: Endoscopy;;   HEMOSTASIS CLIP PLACEMENT  12/18/2020   Procedure: HEMOSTASIS CLIP PLACEMENT;  Surgeon: Rollin Dover, MD;  [facility]OS[facility];  Service: Endoscopy;;   HOT HEMOSTASIS N/A 03/13/2020   Procedure: HOT HEMOSTASIS (ARGON PLASMA COAGULATION/BICAP);  Surgeon: Rollin Dover, MD;  Location: [facility];  Service: Endoscopy;  Laterality: N/A;   HOT HEMOSTASIS N/A 12/18/2020   Procedure: HOT HEMOSTASIS (ARGON PLASMA COAGULATION/BICAP);  Surgeon: Rollin Dover, MD;  Location: [facility]COPY;  Service: Endoscopy;  Laterality: N/A;   IMPLANTABLE CARDIOVERTER DEFIBRILLATOR GENERATOR CHANGE  2008   INSERTION OF MESH N/A 07/09/2018   Procedure: INSERTION OF VICRYL MESH;  Surgeon: Kimble Agent, MD;  [facility];  Service: General;  Laterality: N/A;   LAPAROTOMY N/A 09/18/2017   Procedure: EXPLORATORY LAPAROTOMY;  Surgeon: Kimble Agent, MD;  Location: [facility] OR;  Service: General;  Laterality: N/A;    LEAD REVISION  06/22/2018   LEAD REVISION/REPAIR N/A 06/22/2018   Procedure: LEAD REVISION/REPAIR;  Surgeon: Fernande Elspeth BROCKS, MD;  Location: [facility] INVASIVE CV LAB;  Service: Cardiovascular;  Laterality: N/A;   LYSIS OF ADHESION N/A 09/18/2017   Procedure: LYSIS OF ADHESION;  Surgeon: Kimble Agent, MD;  Location[facility] OR;  Service: General;  Laterality: N/A;   LYSIS OF ADHESION N/A 07/09/2018   Procedure: LYSIS OF ADHESION;  Surgeon: Kimble Agent, MD;  Location: [facility] OR;  Service: General;  Laterality: N/A;   PARTIAL COLECTOMY N/A 09/18/2017   Procedure: ILEOCOLECTOMY;  Surgeon: Kimble Agent, MD;  Location: [facility] OR;  Service: General;  Laterality: N/A;   PARTIAL COLECTOMY  09/25/2017   POLYPECTOMY  03/13/2020   Procedure: POLYPECTOMY;  Surgeon: Rollin Dover, MD;  [facility]OS[facility];  Service: Endoscopy;;   RIGHT/LEFT HEART CATH AND CORONARY ANGIOGRAPHY N/A 06/01/2018   Procedure: RIGHT/LEFT HEART CATH AND CORONARY ANGIOGRAPHY;  Surgeon: Dann Candyce RAMAN, MD;  Location: [facility] INVASIVE CV LAB;  Service: Cardiovascular;  Laterality: N/A;   SIGMOIDOSCOPY N/A 09/18/2017   Procedure: SIGMOIDOSCOPY;  Surgeon: Kimble Agent, MD;  [facility];  Service: General;  Laterality: N/A;   SMALL INTESTINE SURGERY  07/09/2018   EXPLORATION OF  ABDOMINAL WOUND ERAS PATHWAYN/; MESH; LYSIS OF ADHESIONS   TOTAL ABDOMINAL HYSTERECTOMY  1990   TRANSFORAMINAL LUMBAR INTERBODY FUSION (TLIF) WITH PEDICLE SCREW FIXATION 2 LEVEL N/A 01/07/2020   Procedure: Lumbar Four-Five and Lumbar Five-Sacral One open lumbar decompression and transforaminal lumbar interbody fusion;  Surgeon: Cheryle Debby LABOR, MD;  Location: [facility] OR;  Service: Neurosurgery;  Laterality: N/A;  Lumbar Four-Five and Lumbar Five-Sacral One open lumbar decompression and transforaminal lumbar interbody fusion   TRIGGER FINGER RELEASE Right 05/213   TRIGGER FINGER RELEASE Right 04/08/2019   Procedure: RIGHT INDEX RELEASE TRIGGER FINGER/A-1 PULLEY;  Surgeon:  Sebastian Lenis, MD;  Location: [facility] OR;  Service: Orthopedics;  Laterality: Right;   TUBAL LIGATION  1980s   VESICOVAGINAL FISTULA CLOSURE W/ TAH     Past Surgical History:  Procedure Laterality  Date   APPENDECTOMY  07/13/2017   BLADDER SUSPENSION  1990   w/hysterectomy   BOWEL RESECTION N/A 07/09/2018   Procedure: SMALL BOWEL RESECTION;  Surgeon: Kimble Agent, MD;  [facility];  Service: General;  Laterality: N/A;   CARDIAC CATHETERIZATION  2005   no obstructive CAD per patient   CARDIAC CATHETERIZATION  2018   CARDIAC DEFIBRILLATOR PLACEMENT  2005   BiV ICD implanted,  LV lead is an epicardial lead   CARPAL TUNNEL RELEASE Left 07/25/2014   Dr.Williamson    CARPAL TUNNEL RELEASE Right 04/08/2019   Procedure: RIGHT CARPAL TUNNEL RELEASE ENDOSCOPIC;  Surgeon: Sebastian Lenis, MD;  Location: [facility] OR;  Service: Orthopedics;  Laterality: Right;   CATARACT EXTRACTION W/ INTRAOCULAR LENS  IMPLANT, BILATERAL Bilateral    COLONIC STENT PLACEMENT N/A 08/31/2017   Procedure: COLONIC STENT PLACEMENT;  Surgeon: Rollin Dover, MD;  Location: [facility] ENDOSCOPY;  Service: Endoscopy;  Laterality: N/A;   COLONOSCOPY     2010-2011 Dr.Kipreos    COLOSTOMY  12/2016   thelbert 01/26/2017   COLOSTOMY REVERSAL N/A 07/13/2017   Procedure: COLOSTOMY REVERSAL;  Surgeon: Kimble Agent, MD;  [facility];  Service: General;  Laterality: N/A;   CYST REMOVAL HAND Right 05/2013   thumb   DILATION AND CURETTAGE OF UTERUS  X 5-6   ESOPHAGOGASTRODUODENOSCOPY (EGD) WITH PROPOFOL  N/A 03/13/2020   Procedure: ESOPHAGOGASTRODUODENOSCOPY (EGD) WITH PROPOFOL ;  Surgeon: Rollin Dover, MD;  [facility] ENDOS[facility];  Service: Endoscopy;  Laterality: N/A;   EXCISION MASS ABDOMINAL N/A 07/09/2018   Procedure: EXPLORATION OF  ABDOMINAL WOUND ERAS PATHWAY;  Surgeon: Kimble Agent, MD;  Location: [facility] OR;  Service: General;  Laterality: N/A;   FLEXIBLE SIGMOIDOSCOPY N/A 08/24/2017   Procedure: ENID MORIN;  Surgeon: Rollin Dover, MD;  Location: [facility] ENDOSCOPY;  Service: Endoscopy;  Laterality: N/A;   FLEXIBLE SIGMOIDOSCOPY N/A 08/31/2017   Procedure: FLEXIBLE SIGMOIDOSCOPY;  Surgeon: Rollin Dover, MD;  Location: [facility] ENDOSCOPY;  Service: Endoscopy;  Laterality: N/A;  stent placement   FLEXIBLE SIGMOIDOSCOPY N/A 03/13/2020   Procedure: FLEXIBLE SIGMOIDOSCOPY;  Surgeon: Rollin Dover, MD;  [facility] ENDOSCOPY;  Service: Endoscopy;  Laterality: N/A;   FLEXIBLE SIGMOIDOSCOPY N/A 12/18/2020   Procedure: FLEXIBLE SIGMOIDOSCOPY;  Surgeon: Rollin Dover, MD;  [facility] ENDOSCOPY;  Service: Endoscopy;  Laterality: N/A;   HEMOSTASIS CLIP PLACEMENT  03/13/2020   Procedure: HEMOSTASIS CLIP PLACEMENT;  Surgeon: Rollin Dover, MD;  Location[facility] ENDOSCOPY;  Service: Endoscopy;;   HEMOSTASIS CLIP PLACEMENT  12/18/2020   Procedure: HEMOSTASIS CLIP PLACEMENT;  Surgeon: Rollin Dover, MD;  Location[facility] ENDOSCOPY;  Service: Endoscopy;;   HOT HEMOSTASIS N/A 03/13/2020   Procedure: HOT HEMOSTASIS (ARGON PLASMA COAGULATION/BICAP);  Surgeon: Rollin Dover, MD;  Location: [facility];  Service: Endoscopy;  Laterality: N/A;   HOT HEMOSTASIS N/A 12/18/2020   Procedure: HOT HEMOSTASIS (ARGON PLASMA COAGULATION/BICAP);  Surgeon: Rollin Dover, MD;  Location: [facility];  Service: Endoscopy;  Laterality: N/A;   IMPLANTABLE CARDIOVERTER DEFIBRILLATOR GENERATOR CHANGE  2008   INSERTION OF MESH N/A 07/09/2018   Procedure: INSERTION OF VICRYL MESH;  Surgeon: Kimble Agent, MD;  [facility];  Service: General;  Laterality: N/A;   LAPAROTOMY N/A 09/18/2017   Procedure: EXPLORATORY LAPAROTOMY;  Surgeon: Kimble Agent, MD;  Location: [facility] OR;  Service: General;  Laterality: N/A;   LEAD REVISION  06/22/2018   LEAD REVISION/REPAIR N/A 06/22/2018   Procedure: LEAD REVISION/REPAIR;  Surgeon: Fernande Elspeth BROCKS, MD;  Location: [facility] INVASIVE CV LAB;  Service: Cardiovascular;  Laterality: N/A;   LYSIS OF ADHESION N/A 09/18/2017   Procedure: LYSIS OF ADHESION;   Surgeon: Kimble Agent, MD;  Location: [facility] OR;  Service: General;  Laterality: N/A;   LYSIS OF ADHESION N/A 07/09/2018   Procedure: LYSIS OF ADHESION;  Surgeon: Kimble Agent, MD;  Location: [facility] OR;  Service: General;  Laterality: N/A;   PARTIAL COLECTOMY N/A 09/18/2017   Procedure: ILEOCOLECTOMY;  Surgeon: Kimble Agent, MD;  Location: [facility] OR;  Service: General;  Laterality: N/A;   PARTIAL COLECTOMY  09/25/2017   POLYPECTOMY  03/13/2020   Procedure: POLYPECTOMY;  Surgeon: Rollin Dover, MD;  Location[facility] ENDOSCOPY;  Service: Endoscopy;;   RIGHT/LEFT HEART CATH AND CORONARY ANGIOGRAPHY N/A 06/01/2018   Procedure: RIGHT/LEFT HEART CATH AND CORONARY ANGIOGRAPHY;  Surgeon: Dann Candyce RAMAN, MD;  Location: [facility] INVASIVE CV LAB;  Service: Cardiovascular;  Laterality: N/A;   SIGMOIDOSCOPY N/A 09/18/2017   Procedure: SIGMOIDOSCOPY;  Surgeon: Kimble Agent, MD;  [facility] OR;  Service: General;  Laterality: N/A;   SMALL INTESTINE SURGERY  07/09/2018   EXPLORATION OF  ABDOMINAL WOUND ERAS PATHWAYN/; MESH; LYSIS OF ADHESIONS   TOTAL ABDOMINAL HYSTERECTOMY  1990   TRANSFORAMINAL LUMBAR INTERBODY FUSION (TLIF) WITH PEDICLE SCREW FIXATION 2 LEVEL N/A 01/07/2020   Procedure: Lumbar Four-Five and Lumbar Five-Sacral One open lumbar decompression and transforaminal lumbar interbody fusion;  Surgeon: Cheryle Debby LABOR, MD;  Location: [facility] OR;  Service: Neurosurgery;  Laterality: N/A;  Lumbar Four-Five and Lumbar Five-Sacral One open lumbar decompression and transforaminal lumbar interbody fusion   TRIGGER FINGER RELEASE Right 05/213   TRIGGER FINGER RELEASE Right 04/08/2019   Procedure: RIGHT INDEX RELEASE TRIGGER FINGER/A-1 PULLEY;  Surgeon: Sebastian Lenis, MD;  Location: [facility] OR;  Service: Orthopedics;  Laterality: Right;   TUBAL LIGATION  1980s   VESICOVAGINAL FISTULA CLOSURE W/ TAH     Past Medical History:  Diagnosis Date   AICD (automatic cardioverter/defibrillator) present    high RV threshold chronically,  device was turned off in 2014; Device battery has been dead x 7 years; turned it back on 06/22/2018   Anemia, iron  deficiency    Angioedema    felt to likely be due to ace inhibitors but says she has had this even off of medicines, appears to be tolerating ARBs chronically   Anxiety    Arthritis    hands, legs, arms; bad in my back (06/22/2018)   Asthmatic bronchitis with status asthmaticus    Bradycardia    CHF (congestive heart failure) (HCC)    Chronic lower back pain    Chronic pain    CKD (chronic kidney disease), stage III (HCC)    Coronary artery disease    Mild nonobstructive (30% LAD, 30% RCA) by 09/01/16 cath at [facility]   Degenerative joint disease    Depression    Diabetes mellitus, type 2 (HCC)    Diabetic peripheral neuropathy (HCC)    Dizziness    Dyspnea on exertion    Family history of adverse reaction to anesthesia    Mother has nausea   GERD (gastroesophageal reflux disease)    Gout    Heart valve disorder    History of blood transfusion 1981; ~ 2005; 12/2016   childbirth; defibrillator OR; colostomy OR   History of mononucleosis 03/2014   Hyperlipidemia    Hypertension    Hypothyroid    Insomnia    Intervertebral disc degeneration    Ischemic cardiomyopathy    LBBB (left bundle branch block)    Myocardial  infarction [facility]) dx'd ~ 2005   Nonischemic cardiomyopathy (HCC)    s/p ICD in 2005. EF has since recovered   Obesity    On home oxygen  therapy    2L at night (06/22/2018)   OSA on CPAP    Pneumonia    couple times; last time was 12/2016 (06/22/2018)   PONV (postoperative nausea and vomiting)    Presence of permanent cardiac pacemaker    [employer]   Swelling    Syncope    Systemic hypertension    Vitamin D  deficiency    BP (!) 95/58 (BP Location: Left Arm, Patient Position: Sitting, Cuff Size: Large)   Pulse 77   Ht 5' 4 (1.626 m)   Wt 236 lb 12.8 oz (107.4 kg)   SpO2 90%   BMI 40.65 kg/m   Opioid Risk Score:    Fall Risk Score:  `1  Depression screen [facility] 2/9     03/28/2024    2:01 PM 02/08/2024   11:56 AM 01/08/2024   12:46 PM 12/11/2023    1:47 PM 11/06/2023   10:53 AM 09/11/2023   10:15 AM 08/10/2023   10:54 AM  Depression screen PHQ 2/9  Decreased Interest 0 0 1 0 0 0 0  Down, Depressed, Hopeless 0 0 1 0 0 0 0  PHQ - 2 Score 0 0 2 0 0 0 0      Review of Systems  Musculoskeletal:  Positive for back pain and neck pain.       Neck pain, low back pain, bilateral knee pain  All other systems reviewed and are negative.      Objective:   Physical Exam Vitals and nursing note reviewed.  Constitutional:      Appearance: Normal appearance.  Cardiovascular:     Rate and Rhythm: Normal rate and regular rhythm.     Pulses: Normal pulses.     Heart sounds: Normal heart sounds.  Pulmonary:     Effort: Pulmonary effort is normal.     Breath sounds: Normal breath sounds.  Musculoskeletal:     Comments: Normal Muscle Bulk and Muscle Testing Reveals:  Upper Extremities: Full ROM and Muscle Strength  5/5 Lumbar Paraspinal Tenderness: L-4-L-5 Lower Extremities: Decreased ROM and Muscle Strength 5/5 Bilateral Lower Extremities Flexion Produces Pain into her Bilateral Patella's Arises from chair slowly using walker for support Narrow Based  Gait     Skin:    General: Skin is warm and dry.  Neurological:     Mental Status: She is alert and oriented to person, place, and time.  Psychiatric:        Mood and Affect: Mood normal.        Behavior: Behavior normal.          Assessment & Plan:  Chronic Low Back Pain: Encouraged to increase HEP as Tolerated. Continue to Monitor. 05/14/2024 2. Bilateral Primary Osteoarthritis:  S/P Right Knee Genicular nerve radiofrequency neurotomy, with Dr Carilyn. On 08/02/2022 and S/P Left Knee Genicular Nerve Radiofrequency on 03/28/2024. With good relief noted.  : Indication Chronic post operative pain in the Knee, pain postop total knee replacement which  has not responded to conservative management such as physical therapy and medication management; Continue monitor. 05/14/2024 3. Myofascial Pain: Continue HEP as Tolerated. Continue to Monitor.  05/14/2024 4. Chronic Pain Syndrome:Refilled;  Oxycodone  7.5mg  /325 mg one tablet every 6 hours as needed for pain. Oxycodone  10 mg discontinued.  Discontinue Hydrocodone  due to itching,  We will continue  the opioid monitoring program, this consists of regular clinic visits, examinations, urine drug screen, pill counts as well as use of [place]  Controlled Substance Reporting system. A 12 month History has been reviewed on the [place]  Controlled Substance Reporting System on 05/14/2024. 5. Polyarthralgia: Continue HEP as tolerated. Continue current medication regimen. Continue to monitor. 05/14/2024 6. Chronic Bilateral Thoracic - Back Pain: no complaints today. Continue HEP as Tolerated. Continue to Monitor. 05/14/2024.    F/U in 1 month

## 2024-06-13 ENCOUNTER — Encounter: Attending: Registered Nurse | Admitting: Registered Nurse

## 2024-06-13 ENCOUNTER — Encounter: Payer: Self-pay | Admitting: Registered Nurse

## 2024-06-13 VITALS — BP 125/70 | HR 82 | Ht 64.0 in | Wt 237.6 lb

## 2024-06-13 DIAGNOSIS — Z79891 Long term (current) use of opiate analgesic: Secondary | ICD-10-CM | POA: Diagnosis not present

## 2024-06-13 DIAGNOSIS — G894 Chronic pain syndrome: Secondary | ICD-10-CM

## 2024-06-13 DIAGNOSIS — Z9181 History of falling: Secondary | ICD-10-CM | POA: Diagnosis not present

## 2024-06-13 DIAGNOSIS — M170 Bilateral primary osteoarthritis of knee: Secondary | ICD-10-CM | POA: Insufficient documentation

## 2024-06-13 DIAGNOSIS — M25552 Pain in left hip: Secondary | ICD-10-CM | POA: Diagnosis not present

## 2024-06-13 DIAGNOSIS — M25562 Pain in left knee: Secondary | ICD-10-CM | POA: Insufficient documentation

## 2024-06-13 DIAGNOSIS — M545 Low back pain, unspecified: Secondary | ICD-10-CM | POA: Insufficient documentation

## 2024-06-13 DIAGNOSIS — M542 Cervicalgia: Secondary | ICD-10-CM | POA: Diagnosis not present

## 2024-06-13 DIAGNOSIS — Z95810 Presence of automatic (implantable) cardiac defibrillator: Secondary | ICD-10-CM | POA: Diagnosis not present

## 2024-06-13 DIAGNOSIS — Z5181 Encounter for therapeutic drug level monitoring: Secondary | ICD-10-CM | POA: Insufficient documentation

## 2024-06-13 DIAGNOSIS — G8929 Other chronic pain: Secondary | ICD-10-CM | POA: Diagnosis present

## 2024-06-13 DIAGNOSIS — W19XXXD Unspecified fall, subsequent encounter: Secondary | ICD-10-CM | POA: Diagnosis not present

## 2024-06-13 DIAGNOSIS — Y92009 Unspecified place in unspecified non-institutional (private) residence as the place of occurrence of the external cause: Secondary | ICD-10-CM | POA: Insufficient documentation

## 2024-06-13 DIAGNOSIS — G8928 Other chronic postprocedural pain: Secondary | ICD-10-CM | POA: Insufficient documentation

## 2024-06-13 DIAGNOSIS — M7918 Myalgia, other site: Secondary | ICD-10-CM | POA: Insufficient documentation

## 2024-06-13 DIAGNOSIS — M25561 Pain in right knee: Secondary | ICD-10-CM | POA: Diagnosis not present

## 2024-06-13 DIAGNOSIS — Z96659 Presence of unspecified artificial knee joint: Secondary | ICD-10-CM | POA: Diagnosis not present

## 2024-06-13 DIAGNOSIS — M546 Pain in thoracic spine: Secondary | ICD-10-CM | POA: Diagnosis not present

## 2024-06-13 MED ORDER — OXYCODONE-ACETAMINOPHEN 7.5-325 MG PO TABS: 1.0000 | ORAL_TABLET | Freq: Four times a day (QID) | ORAL | 0 refills | Status: DC | PRN

## 2024-06-13 NOTE — Progress Notes (Signed)
 Subjective:    Patient ID: Crystal [name] [place], female    DOB: [birth year], 73 y.o.   MRN: [id number]  HPI: Crystal [name] is a 73 y.o. female who returns for follow up appointment for chronic pain and medication refill. She states her pain is located in her neck, lower back and bilateral knee pain. She rates her pain 6. Her current exercise regime is walking with walker.   Crystal [name] reports 4 weeks ago she was walking outside  in the rain she lost her footing and fell forward. Her son-in law helped her up. She was educated on falls prevention, she verbalizes understanding.   Crystal [name] Morphine  equivalent is 45.00 MME.   Last Oral Swab was Performed on 03/28/2024, it was consistent.    Pain Inventory Average Pain 9 Pain Right Now 6 My pain is sharp, burning, stabbing, and tingling  In the last 24 hours, has pain interfered with the following? General activity 7 Relation with others 5 Enjoyment of life 7 What TIME of day is your pain at its worst? morning , evening, and night Sleep (in general) Good  Pain is worse with: walking, bending, standing, and some activites Pain improves with: rest, therapy/exercise, and medication Relief from Meds: 9  Family History  Problem Relation Age of Onset   Arrhythmia Mother    Diabetes Mellitus II Mother        Borderline DM   Hypertension Mother    Asthma Mother    Hypertension Father    Heart disease Father    Prostate cancer Father    Hypertension Daughter    Hypertension Daughter    Diabetes Daughter    Hypertension Daughter    Atrial fibrillation Daughter    GER disease Daughter    Breast cancer Maternal Grandmother 26 - 43   Cancer Brother    Dementia Brother    Hypertension Brother    Hypertension Son    Anxiety disorder Son    Hypothyroidism Son    Heart disease Other        (Maternal side) Ischemic heart disease   Diabetes Mellitus II Other    Social History   Socioeconomic History   Marital status: Widowed    Spouse  name: Not on file   Number of children: Not on file   Years of education: Not on file   Highest education level: Not on file  Occupational History    Comment: retired LPN  Tobacco Use   Smoking status: Never   Smokeless tobacco: Never  Vaping Use   Vaping status: Never Used  Substance and Sexual Activity   Alcohol  use: Not Currently   Drug use: Never   Sexual activity: Not Currently  Other Topics Concern   Not on file  Social History Narrative   [place] alone.  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      As of 03/15/17:   Diet: 1800 Calorie      Caffeine: Yes      Married, if yes what year: Widowed, married 1973      Do you live in a house, apartment, assisted living, [place], trailer, ect: House, 1 stories, and 1 person      Pets: No      Current/Past profession: LPN, retired       Exercise: Yes, walking          Living Will: Yes   DNR: No  POA/HPOA: Yes      Functional Status:   Do you have difficulty bathing or dressing yourself? No   Do you have difficulty preparing food or eating? No   Do you have difficulty managing your medications? No   Do you have difficulty managing your finances? No   Do you have difficulty affording your medications? Yes   Social Drivers of Corporate Investment Banker Strain: Low Risk  (10/11/2017)   Overall Financial Resource Strain (CARDIA)    Difficulty of Paying Living Expenses: Not hard at all  Food Insecurity: No Food Insecurity (07/06/2023)   Hunger Vital Sign    Worried About Running Out of Food in the Last Year: Never true    Ran Out of Food in the Last Year: Never true  Transportation Needs: No Transportation Needs (07/06/2023)   PRAPARE - Administrator, Civil Service (Medical): No    Lack of Transportation (Non-Medical): No  Physical Activity: Inactive (10/11/2017)   Exercise Vital Sign    Days of Exercise per Week: 0 days    Minutes of Exercise  per Session: 0 min  Stress: Stress Concern Present (10/11/2017)   [name] - Occupational Stress Questionnaire    Feeling of Stress : To some extent  Social Connections: Somewhat Isolated (10/11/2017)   Social Connection and Isolation Panel    Frequency of Communication with Friends and Family: More than three times a week    Frequency of Social Gatherings with Friends and Family: More than three times a week    Attends Religious Services: More than 4 times per year    Active Member of [employer] or Organizations: No    Attends Banker Meetings: Never    Marital Status: Widowed   Past Surgical History:  Procedure Laterality Date   APPENDECTOMY  07/13/2017   BLADDER SUSPENSION  1990   w/hysterectomy   BOWEL RESECTION N/A 07/09/2018   Procedure: SMALL BOWEL RESECTION;  Surgeon: Kimble Agent, MD;  [facility]: [facility];  Service: General;  Laterality: N/A;   CARDIAC CATHETERIZATION  2005   no obstructive CAD per patient   CARDIAC CATHETERIZATION  2018   CARDIAC DEFIBRILLATOR PLACEMENT  2005   BiV ICD implanted,  LV lead is an epicardial lead   CARPAL TUNNEL RELEASE Left 07/25/2014   Dr.Williamson    CARPAL TUNNEL RELEASE Right 04/08/2019   Procedure: RIGHT CARPAL TUNNEL RELEASE ENDOSCOPIC;  Surgeon: Sebastian Lenis, MD;  Location: [facility] OR;  Service: Orthopedics;  Laterality: Right;   CATARACT EXTRACTION W/ INTRAOCULAR LENS  IMPLANT, BILATERAL Bilateral    COLONIC STENT PLACEMENT N/A 08/31/2017   Procedure: COLONIC STENT PLACEMENT;  Surgeon: Rollin Dover, MD;  Location: [facility] ENDOSCOPY;  Service: Endoscopy;  Laterality: N/A;   COLONOSCOPY     2010-2011 Dr.Kipreos    COLOSTOMY  12/2016   Crystal [name] 01/26/2017   COLOSTOMY REVERSAL N/A 07/13/2017   Procedure: COLOSTOMY REVERSAL;  Surgeon: Kimble Agent, MD;  [facility];  Service: General;  Laterality: N/A;   CYST REMOVAL HAND Right 05/2013   thumb   DILATION AND CURETTAGE OF UTERUS  X 5-6    ESOPHAGOGASTRODUODENOSCOPY (EGD) WITH PROPOFOL  N/A 03/13/2020   Procedure: ESOPHAGOGASTRODUODENOSCOPY (EGD) WITH PROPOFOL ;  Surgeon: Rollin Dover, MD;  Location: [facility] ENDOSCOPY;  Service: Endoscopy;  Laterality: N/A;   EXCISION MASS ABDOMINAL N/A 07/09/2018   Procedure: EXPLORATION OF  ABDOMINAL WOUND ERAS PATHWAY;  Surgeon: Kimble Agent, MD;  Location: [facility] OR;  Service: General;  Laterality:  N/A;   FLEXIBLE SIGMOIDOSCOPY N/A 08/24/2017   Procedure: FLEXIBLE SIGMOIDOSCOPY;  Surgeon: Rollin Dover, MD;  Location: [facility] ENDOSCOPY;  Service: Endoscopy;  Laterality: N/A;   FLEXIBLE SIGMOIDOSCOPY N/A 08/31/2017   Procedure: FLEXIBLE SIGMOIDOSCOPY;  Surgeon: Rollin Dover, MD;  Location: [facility] ENDOSCOPY;  Service: Endoscopy;  Laterality: N/A;  stent placement   FLEXIBLE SIGMOIDOSCOPY N/A 03/13/2020   Procedure: FLEXIBLE SIGMOIDOSCOPY;  Surgeon: Rollin Dover, MD;  [facility] ENDOSCOPY;  Service: Endoscopy;  Laterality: N/A;   FLEXIBLE SIGMOIDOSCOPY N/A 12/18/2020   Procedure: FLEXIBLE SIGMOIDOSCOPY;  Surgeon: Rollin Dover, MD;  [facility] ENDOSCOPY;  Service: Endoscopy;  Laterality: N/A;   HEMOSTASIS CLIP PLACEMENT  03/13/2020   Procedure: HEMOSTASIS CLIP PLACEMENT;  Surgeon: Rollin Dover, MD;  [facility] ENDOSCOPY;  Service: Endoscopy;;   HEMOSTASIS CLIP PLACEMENT  12/18/2020   Procedure: HEMOSTASIS CLIP PLACEMENT;  Surgeon: Rollin Dover, MD;  [facility]OS[facility];  Service: Endoscopy;;   HOT HEMOSTASIS N/A 03/13/2020   Procedure: HOT HEMOSTASIS (ARGON PLASMA COAGULATION/BICAP);  Surgeon: Rollin Dover, MD;  Location: [facility];  Service: Endoscopy;  Laterality: N/A;   HOT HEMOSTASIS N/A 12/18/2020   Procedure: HOT HEMOSTASIS (ARGON PLASMA COAGULATION/BICAP);  Surgeon: Rollin Dover, MD;  Location: [facility]OSCOPY;  Service: Endoscopy;  Laterality: N/A;   IMPLANTABLE CARDIOVERTER DEFIBRILLATOR GENERATOR CHANGE  2008   INSERTION OF MESH N/A 07/09/2018   Procedure: INSERTION OF VICRYL MESH;  Surgeon: Kimble Agent, MD;  [facility];  Service: General;  Laterality: N/A;   LAPAROTOMY N/A 09/18/2017   Procedure: EXPLORATORY LAPAROTOMY;  Surgeon: Kimble Agent, MD;  Location: [facility] OR;  Service: General;  Laterality: N/A;   LEAD REVISION  06/22/2018   LEAD REVISION/REPAIR N/A 06/22/2018   Procedure: LEAD REVISION/REPAIR;  Surgeon: Fernande Elspeth BROCKS, MD;  Location: [facility] INVASIVE CV LAB;  Service: Cardiovascular;  Laterality: N/A;   LYSIS OF ADHESION N/A 09/18/2017   Procedure: LYSIS OF ADHESION;  Surgeon: Kimble Agent, MD;  Location: [facility] OR;  Service: General;  Laterality: N/A;   LYSIS OF ADHESION N/A 07/09/2018   Procedure: LYSIS OF ADHESION;  Surgeon: Kimble Agent, MD;  Location: [facility] OR;  Service: General;  Laterality: N/A;   PARTIAL COLECTOMY N/A 09/18/2017   Procedure: ILEOCOLECTOMY;  Surgeon: Kimble Agent, MD;  Location: [facility] OR;  Service: General;  Laterality: N/A;   PARTIAL COLECTOMY  09/25/2017   POLYPECTOMY  03/13/2020   Procedure: POLYPECTOMY;  Surgeon: Rollin Dover, MD;  [facility] ENDOSCOPY;  Service: Endoscopy;;   RIGHT/LEFT HEART CATH AND CORONARY ANGIOGRAPHY N/A 06/01/2018   Procedure: RIGHT/LEFT HEART CATH AND CORONARY ANGIOGRAPHY;  Surgeon: Dann Candyce RAMAN, MD;  Location: [facility] INVASIVE CV LAB;  Service: Cardiovascular;  Laterality: N/A;   SIGMOIDOSCOPY N/A 09/18/2017   Procedure: SIGMOIDOSCOPY;  Surgeon: Kimble Agent, MD;  [facility];  Service: General;  Laterality: N/A;   SMALL INTESTINE SURGERY  07/09/2018   EXPLORATION OF  ABDOMINAL WOUND ERAS PATHWAYN/; MESH; LYSIS OF ADHESIONS   TOTAL ABDOMINAL HYSTERECTOMY  1990   TRANSFORAMINAL LUMBAR INTERBODY FUSION (TLIF) WITH PEDICLE SCREW FIXATION 2 LEVEL N/A 01/07/2020   Procedure: Lumbar Four-Five and Lumbar Five-Sacral One open lumbar decompression and transforaminal lumbar interbody fusion;  Surgeon: Cheryle Debby LABOR, MD;  Location: [facility] OR;  Service: Neurosurgery;  Laterality: N/A;  Lumbar Four-Five and Lumbar Five-Sacral One open lumbar  decompression and transforaminal lumbar interbody fusion   TRIGGER FINGER RELEASE Right 05/213   TRIGGER FINGER RELEASE Right 04/08/2019   Procedure: RIGHT INDEX RELEASE TRIGGER FINGER/A-1 PULLEY;  Surgeon:  Sebastian Lenis, MD;  Location: [facility] OR;  Service: Orthopedics;  Laterality: Right;   TUBAL LIGATION  1980s   VESICOVAGINAL FISTULA CLOSURE W/ TAH     Past Surgical History:  Procedure Laterality Date   APPENDECTOMY  07/13/2017   BLADDER SUSPENSION  1990   w/hysterectomy   BOWEL RESECTION N/A 07/09/2018   Procedure: SMALL BOWEL RESECTION;  Surgeon: Kimble Agent, MD;  [facility];  Service: General;  Laterality: N/A;   CARDIAC CATHETERIZATION  2005   no obstructive CAD per patient   CARDIAC CATHETERIZATION  2018   CARDIAC DEFIBRILLATOR PLACEMENT  2005   BiV ICD implanted,  LV lead is an epicardial lead   CARPAL TUNNEL RELEASE Left 07/25/2014   Dr.Williamson    CARPAL TUNNEL RELEASE Right 04/08/2019   Procedure: RIGHT CARPAL TUNNEL RELEASE ENDOSCOPIC;  Surgeon: Sebastian Lenis, MD;  Location: [facility] OR;  Service: Orthopedics;  Laterality: Right;   CATARACT EXTRACTION W/ INTRAOCULAR LENS  IMPLANT, BILATERAL Bilateral    COLONIC STENT PLACEMENT N/A 08/31/2017   Procedure: COLONIC STENT PLACEMENT;  Surgeon: Rollin Dover, MD;  Location: [facility] ENDOSCOPY;  Service: Endoscopy;  Laterality: N/A;   COLONOSCOPY     2010-2011 Dr.Kipreos    COLOSTOMY  12/2016   Crystal [name] 01/26/2017   COLOSTOMY REVERSAL N/A 07/13/2017   Procedure: COLOSTOMY REVERSAL;  Surgeon: Kimble Agent, MD;  [facility];  Service: General;  Laterality: N/A;   CYST REMOVAL HAND Right 05/2013   thumb   DILATION AND CURETTAGE OF UTERUS  X 5-6   ESOPHAGOGASTRODUODENOSCOPY (EGD) WITH PROPOFOL  N/A 03/13/2020   Procedure: ESOPHAGOGASTRODUODENOSCOPY (EGD) WITH PROPOFOL ;  Surgeon: Rollin Dover, MD;  [facility];  Service: Endoscopy;  Laterality: N/A;   EXCISION MASS ABDOMINAL N/A 07/09/2018   Procedure: EXPLORATION OF   ABDOMINAL WOUND ERAS PATHWAY;  Surgeon: Kimble Agent, MD;  Location: [facility] OR;  Service: General;  Laterality: N/A;   FLEXIBLE SIGMOIDOSCOPY N/A 08/24/2017   Procedure: ENID MORIN;  Surgeon: Rollin Dover, MD;  Location: [facility] ENDOSCOPY;  Service: Endoscopy;  Laterality: N/A;   FLEXIBLE SIGMOIDOSCOPY N/A 08/31/2017   Procedure: FLEXIBLE SIGMOIDOSCOPY;  Surgeon: Rollin Dover, MD;  Location: [facility] ENDOSCOPY;  Service: Endoscopy;  Laterality: N/A;  stent placement   FLEXIBLE SIGMOIDOSCOPY N/A 03/13/2020   Procedure: FLEXIBLE SIGMOIDOSCOPY;  Surgeon: Rollin Dover, MD;  Location[facility] ENDOSCOPY;  Service: Endoscopy;  Laterality: N/A;   FLEXIBLE SIGMOIDOSCOPY N/A 12/18/2020   Procedure: FLEXIBLE SIGMOIDOSCOPY;  Surgeon: Rollin Dover, MD;  Location: [facility] ENDOSCOPY;  Service: Endoscopy;  Laterality: N/A;   HEMOSTASIS CLIP PLACEMENT  03/13/2020   Procedure: HEMOSTASIS CLIP PLACEMENT;  Surgeon: Rollin Dover, MD;  Location[facility] ENDOSCOPY;  Service: Endoscopy;;   HEMOSTASIS CLIP PLACEMENT  12/18/2020   Procedure: HEMOSTASIS CLIP PLACEMENT;  Surgeon: Rollin Dover, MD;  Location[facility] ENDOSCOPY;  Service: Endoscopy;;   HOT HEMOSTASIS N/A 03/13/2020   Procedure: HOT HEMOSTASIS (ARGON PLASMA COAGULATION/BICAP);  Surgeon: Rollin Dover, MD;  Location: [facility];  Service: Endoscopy;  Laterality: N/A;   HOT HEMOSTASIS N/A 12/18/2020   Procedure: HOT HEMOSTASIS (ARGON PLASMA COAGULATION/BICAP);  Surgeon: Rollin Dover, MD;  Location: [facility];  Service: Endoscopy;  Laterality: N/A;   IMPLANTABLE CARDIOVERTER DEFIBRILLATOR GENERATOR CHANGE  2008   INSERTION OF MESH N/A 07/09/2018   Procedure: INSERTION OF VICRYL MESH;  Surgeon: Kimble Agent, MD;  Location: [facility] OR;  Service: General;  Laterality: N/A;   LAPAROTOMY N/A 09/18/2017   Procedure: EXPLORATORY LAPAROTOMY;  Surgeon: Kimble Agent, MD;  Location: [facility] OR;  Service: General;  Laterality: N/A;   LEAD REVISION  06/22/2018   LEAD REVISION/REPAIR N/A 06/22/2018    Procedure: LEAD REVISION/REPAIR;  Surgeon: Fernande Elspeth BROCKS, MD;  Location: [facility] INVASIVE CV LAB;  Service: Cardiovascular;  Laterality: N/A;   LYSIS OF ADHESION N/A 09/18/2017   Procedure: LYSIS OF ADHESION;  Surgeon: Kimble Agent, MD;  Location: [facility] OR;  Service: General;  Laterality: N/A;   LYSIS OF ADHESION N/A 07/09/2018   Procedure: LYSIS OF ADHESION;  Surgeon: Kimble Agent, MD;  Location: [facility] OR;  Service: General;  Laterality: N/A;   PARTIAL COLECTOMY N/A 09/18/2017   Procedure: ILEOCOLECTOMY;  Surgeon: Kimble Agent, MD;  Location: [facility] OR;  Service: General;  Laterality: N/A;   PARTIAL COLECTOMY  09/25/2017   POLYPECTOMY  03/13/2020   Procedure: POLYPECTOMY;  Surgeon: Rollin Dover, MD;  Location: [facility] ENDOSCOPY;  Service: Endoscopy;;   RIGHT/LEFT HEART CATH AND CORONARY ANGIOGRAPHY N/A 06/01/2018   Procedure: RIGHT/LEFT HEART CATH AND CORONARY ANGIOGRAPHY;  Surgeon: Dann Candyce RAMAN, MD;  Location: [facility] INVASIVE CV LAB;  Service: Cardiovascular;  Laterality: N/A;   SIGMOIDOSCOPY N/A 09/18/2017   Procedure: SIGMOIDOSCOPY;  Surgeon: Kimble Agent, MD;  Location[facility] OR;  Service: General;  Laterality: N/A;   SMALL INTESTINE SURGERY  07/09/2018   EXPLORATION OF  ABDOMINAL WOUND ERAS PATHWAYN/; MESH; LYSIS OF ADHESIONS   TOTAL ABDOMINAL HYSTERECTOMY  1990   TRANSFORAMINAL LUMBAR INTERBODY FUSION (TLIF) WITH PEDICLE SCREW FIXATION 2 LEVEL N/A 01/07/2020   Procedure: Lumbar Four-Five and Lumbar Five-Sacral One open lumbar decompression and transforaminal lumbar interbody fusion;  Surgeon: Cheryle Debby LABOR, MD;  Location: [facility] OR;  Service: Neurosurgery;  Laterality: N/A;  Lumbar Four-Five and Lumbar Five-Sacral One open lumbar decompression and transforaminal lumbar interbody fusion   TRIGGER FINGER RELEASE Right 05/213   TRIGGER FINGER RELEASE Right 04/08/2019   Procedure: RIGHT INDEX RELEASE TRIGGER FINGER/A-1 PULLEY;  Surgeon: Sebastian Lenis, MD;  Location: [facility] OR;  Service: Orthopedics;  Laterality:  Right;   TUBAL LIGATION  1980s   VESICOVAGINAL FISTULA CLOSURE W/ TAH     Past Medical History:  Diagnosis Date   AICD (automatic cardioverter/defibrillator) present    high RV threshold chronically, device was turned off in 2014; Device battery has been dead x 7 years; turned it back on 06/22/2018   Anemia, iron  deficiency    Angioedema    felt to likely be due to ace inhibitors but says she has had this even off of medicines, appears to be tolerating ARBs chronically   Anxiety    Arthritis    hands, legs, arms; bad in my back (06/22/2018)   Asthmatic bronchitis with status asthmaticus    Bradycardia    CHF (congestive heart failure) (HCC)    Chronic lower back pain    Chronic pain    CKD (chronic kidney disease), stage III (HCC)    Coronary artery disease    Mild nonobstructive (30% LAD, 30% RCA) by 09/01/16 cath at [facility]   Degenerative joint disease    Depression    Diabetes mellitus, type 2 (HCC)    Diabetic peripheral neuropathy (HCC)    Dizziness    Dyspnea on exertion    Family history of adverse reaction to anesthesia    Mother has nausea   GERD (gastroesophageal reflux disease)    Gout    Heart valve disorder    History of blood transfusion 1981; ~ 2005; 12/2016   childbirth; defibrillator OR; colostomy OR   History of mononucleosis 03/2014   Hyperlipidemia  Hypertension    Hypothyroid    Insomnia    Intervertebral disc degeneration    Ischemic cardiomyopathy    LBBB (left bundle branch block)    Myocardial infarction (HCC) dx'd ~ 2005   Nonischemic cardiomyopathy (HCC)    s/p ICD in 2005. EF has since recovered   Obesity    On home oxygen  therapy    2L at night (06/22/2018)   OSA on CPAP    Pneumonia    couple times; last time was 12/2016 (06/22/2018)   PONV (postoperative nausea and vomiting)    Presence of permanent cardiac pacemaker    [employer]   Swelling    Syncope    Systemic hypertension    Vitamin D  deficiency     BP (!) 97/59 (BP Location: Left Arm, Patient Position: Sitting, Cuff Size: Large)   Pulse 82   Ht 5' 4 (1.626 m)   Wt 237 lb 9.6 oz (107.8 kg)   SpO2 94%   BMI 40.78 kg/m   Opioid Risk Score:   Fall Risk Score:  `1  Depression screen [facility] 2/9     03/28/2024    2:01 PM 02/08/2024   11:56 AM 01/08/2024   12:46 PM 12/11/2023    1:47 PM 11/06/2023   10:53 AM 09/11/2023   10:15 AM 08/10/2023   10:54 AM  Depression screen PHQ 2/9  Decreased Interest 0 0 1 0 0 0 0  Down, Depressed, Hopeless 0 0 1 0 0 0 0  PHQ - 2 Score 0 0 2 0 0 0 0      Review of Systems  Musculoskeletal:  Positive for arthralgias, back pain and neck pain.       Low back pain, neck pain, left hip pain, left wrist/hand pain, bilateral knee pain  All other systems reviewed and are negative.      Objective:   Physical Exam Vitals and nursing note reviewed.  Constitutional:      Appearance: Normal appearance.  Cardiovascular:     Rate and Rhythm: Normal rate and regular rhythm.     Pulses: Normal pulses.     Heart sounds: Normal heart sounds.  Pulmonary:     Effort: Pulmonary effort is normal.     Breath sounds: Normal breath sounds.  Musculoskeletal:     Comments: Normal Muscle Bulk and Muscle Testing Reveals:  Upper Extremities: Full ROM and Muscle Strength 5/5  Lumbar Paraspinal Tenderness: L-4-L-5 Left Greater Trochanter Tenderness Lower Extremities: Full ROM and Muscle Strength 5/5 Arises from Table slowly using walker for support Antalgic  Gait     Skin:    General: Skin is warm and dry.  Neurological:     Mental Status: She is alert and oriented to person, place, and time.  Psychiatric:        Mood and Affect: Mood normal.        Behavior: Behavior normal.          Assessment & Plan:  Chronic Low Back Pain: Encouraged to increase HEP as Tolerated. Continue to Monitor. 06/13/2024 2. Bilateral Primary Osteoarthritis:  S/P Right Knee Genicular nerve radiofrequency neurotomy, with Dr  Carilyn. On 08/02/2022 and S/P Left Knee Genicular Nerve Radiofrequency on 03/28/2024. With good relief noted.  : Indication Chronic post operative pain in the Knee, pain postop total knee replacement which has not responded to conservative management such as physical therapy and medication management; Continue monitor. 06/13/2024 3. Myofascial Pain: Continue HEP as Tolerated. Continue to Monitor.  06/13/2024  4. Chronic Pain Syndrome:Refilled;  Oxycodone  7.5mg  /325 mg one tablet every 6 hours as needed for pain. Oxycodone  10 mg discontinued.  Discontinue Hydrocodone  due to itching,  We will continue the opioid monitoring program, this consists of regular clinic visits, examinations, urine drug screen, pill counts as well as use of [place]  Controlled Substance Reporting system. A 12 month History has been reviewed on the [place]  Controlled Substance Reporting System on 06/13/2024. 5. Polyarthralgia: Continue HEP as tolerated. Continue current medication regimen. Continue to monitor. 06/13/2024 6. Chronic Bilateral Thoracic - Back Pain: no complaints today. Continue HEP as Tolerated. Continue to Monitor. 06/13/2024.  7. Fall at home: Educated on [employer], she verbalizes understanding.   F/U in 1 month

## 2024-06-24 ENCOUNTER — Ambulatory Visit

## 2024-06-24 DIAGNOSIS — I5022 Chronic systolic (congestive) heart failure: Secondary | ICD-10-CM

## 2024-06-26 LAB — CUP PACEART REMOTE DEVICE CHECK
Battery Remaining Longevity: 30 mo
Battery Remaining Percentage: 34 %
Brady Statistic RA Percent Paced: 0 %
Brady Statistic RV Percent Paced: 97 %
Date Time Interrogation Session: 20251201020100
HighPow Impedance: 72 Ohm
Lead Channel Impedance Value: 460 Ohm
Lead Channel Impedance Value: 527 Ohm
Lead Channel Impedance Value: 784 Ohm
Lead Channel Pacing Threshold Amplitude: 0.8 V
Lead Channel Pacing Threshold Pulse Width: 0.4 ms
Lead Channel Setting Pacing Amplitude: 2 V
Lead Channel Setting Pacing Amplitude: 2.5 V
Lead Channel Setting Pacing Amplitude: 4 V
Lead Channel Setting Pacing Pulse Width: 0.4 ms
Lead Channel Setting Pacing Pulse Width: 1 ms
Lead Channel Setting Sensing Sensitivity: 0.5 mV
Lead Channel Setting Sensing Sensitivity: 1 mV
Pulse Gen Serial Number: 227627

## 2024-06-28 NOTE — Progress Notes (Signed)
 Remote ICD Transmission

## 2024-07-08 ENCOUNTER — Ambulatory Visit
Admission: RE | Admit: 2024-07-08 | Discharge: 2024-07-08 | Disposition: A | Source: Ambulatory Visit | Attending: Registered Nurse

## 2024-07-12 ENCOUNTER — Ambulatory Visit: Payer: Self-pay | Admitting: Cardiology

## 2024-07-15 ENCOUNTER — Encounter: Attending: Registered Nurse | Admitting: Registered Nurse

## 2024-07-15 ENCOUNTER — Encounter: Payer: Self-pay | Admitting: Registered Nurse

## 2024-07-15 VITALS — BP 105/62 | HR 77 | Ht 64.0 in | Wt 246.4 lb

## 2024-07-15 DIAGNOSIS — M961 Postlaminectomy syndrome, not elsewhere classified: Secondary | ICD-10-CM | POA: Diagnosis present

## 2024-07-15 DIAGNOSIS — Z79891 Long term (current) use of opiate analgesic: Secondary | ICD-10-CM | POA: Insufficient documentation

## 2024-07-15 DIAGNOSIS — Z5181 Encounter for therapeutic drug level monitoring: Secondary | ICD-10-CM | POA: Insufficient documentation

## 2024-07-15 DIAGNOSIS — M25552 Pain in left hip: Secondary | ICD-10-CM | POA: Diagnosis present

## 2024-07-15 DIAGNOSIS — M542 Cervicalgia: Secondary | ICD-10-CM | POA: Diagnosis present

## 2024-07-15 DIAGNOSIS — G8929 Other chronic pain: Secondary | ICD-10-CM | POA: Insufficient documentation

## 2024-07-15 DIAGNOSIS — M170 Bilateral primary osteoarthritis of knee: Secondary | ICD-10-CM | POA: Insufficient documentation

## 2024-07-15 DIAGNOSIS — M545 Low back pain, unspecified: Secondary | ICD-10-CM | POA: Diagnosis present

## 2024-07-15 DIAGNOSIS — G894 Chronic pain syndrome: Secondary | ICD-10-CM | POA: Insufficient documentation

## 2024-07-15 MED ORDER — OXYCODONE-ACETAMINOPHEN 10-325 MG PO TABS: 1.0000 | ORAL_TABLET | Freq: Four times a day (QID) | ORAL | 0 refills | Status: DC | PRN

## 2024-07-15 NOTE — Progress Notes (Signed)
 "  Subjective:    Patient ID: Crystal [name] Remington, female    DOB: 03-Dec-1950, 73 y.o.   MRN: [id number]  HPI: Crystal [name] is a 73 y.o. female who returns for follow up appointment for chronic pain and medication refill. She states her  pain is located in her neck,  reports increase intensity of lower back pain denies falling and bilateral knee pain. Also reports left hip pain. She reports receiving r 3 hours of relief with her current medication regimen, Oxycodone  dose change, she verbalizes understanding. She will call offce in 2-3 weeks with update on medication change.Her  current exercise regime is walking short distances with walker.  Ms. Erlich Morphine  equivalent is 45.00 MME.   Last Oral Swab was Performed on 03/28/2024, it was consistent.    Pain Inventory Average Pain 9 Pain Right Now 7 My pain is sharp, burning, stabbing, and aching  In the last 24 hours, has pain interfered with the following? General activity 8 Relation with others 6 Enjoyment of life 8 What TIME of day is your pain at its worst? morning , evening, night, and varies Sleep (in general) Good  Pain is worse with: walking, bending, standing, and some activites Pain improves with: rest and medication Relief from Meds: 5  Family History  Problem Relation Age of Onset   Arrhythmia Mother    Diabetes Mellitus II Mother        Borderline DM   Hypertension Mother    Asthma Mother    Hypertension Father    Heart disease Father    Prostate cancer Father    Hypertension Daughter    Hypertension Daughter    Diabetes Daughter    Hypertension Daughter    Atrial fibrillation Daughter    GER disease Daughter    Breast cancer Maternal Grandmother 76 - 47   Cancer Brother    Dementia Brother    Hypertension Brother    Hypertension Son    Anxiety disorder Son    Hypothyroidism Son    Heart disease Other        (Maternal side) Ischemic heart disease   Diabetes Mellitus II Other    Social History    Socioeconomic History   Marital status: Widowed    Spouse name: Not on file   Number of children: Not on file   Years of education: Not on file   Highest education level: Not on file  Occupational History    Comment: retired LPN  Tobacco Use   Smoking status: Never   Smokeless tobacco: Never  Vaping Use   Vaping status: Never Used  Substance and Sexual Activity   Alcohol  use: Not Currently   Drug use: Never   Sexual activity: Not Currently  Other Topics Concern   Not on file  Social History Narrative   [place].  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      As of 03/15/17:   Diet: 1800 Calorie      Caffeine: Yes      Married, if yes what year: Widowed, married 1973      Do you live in a house, apartment, assisted living, [place], trailer, ect: House, 1 stories, and 1 person      Pets: No      Current/Past profession: LPN, retired       Exercise: Yes, walking          Living Will: Yes  DNR: No   POA/HPOA: Yes      Functional Status:   Do you have difficulty bathing or dressing yourself? No   Do you have difficulty preparing food or eating? No   Do you have difficulty managing your medications? No   Do you have difficulty managing your finances? No   Do you have difficulty affording your medications? Yes   Social Drivers of Health   Tobacco Use: Low Risk (07/15/2024)   Patient History    Smoking Tobacco Use: Never    Smokeless Tobacco Use: Never    Passive Exposure: Not on file  Financial Resource Strain: Not on file  Food Insecurity: No Food Insecurity (07/06/2023)   Hunger Vital Sign    Worried About Running Out of Food in the Last Year: Never true    Ran Out of Food in the Last Year: Never true  Transportation Needs: No Transportation Needs (07/06/2023)   PRAPARE - Administrator, Civil Service (Medical): No    Lack of Transportation (Non-Medical): No  Physical Activity:  Not on file  Stress: Not on file  Social Connections: Not on file  Depression (PHQ2-9): Low Risk (03/28/2024)   Depression (PHQ2-9)    PHQ-2 Score: 0  Alcohol  Screen: Not on file  Housing: Unknown (07/06/2023)   Housing Stability Vital Sign    Unable to Pay for Housing in the Last Year: No    Number of Times Moved in the Last Year: Not on file    Homeless in the Last Year: No  Utilities: Not At Risk (07/06/2023)   AHC Utilities    Threatened with loss of utilities: No  Health Literacy: Not on file   Past Surgical History:  Procedure Laterality Date   APPENDECTOMY  07/13/2017   BLADDER SUSPENSION  1990   w/hysterectomy   BOWEL RESECTION N/A 07/09/2018   Procedure: SMALL BOWEL RESECTION;  Surgeon: Kimble Agent, MD;  [facility];  Service: General;  Laterality: N/A;   CARDIAC CATHETERIZATION  2005   no obstructive CAD per patient   CARDIAC CATHETERIZATION  2018   CARDIAC DEFIBRILLATOR PLACEMENT  2005   BiV ICD implanted,  LV lead is an epicardial lead   CARPAL TUNNEL RELEASE Left 07/25/2014   Dr.Williamson    CARPAL TUNNEL RELEASE Right 04/08/2019   Procedure: RIGHT CARPAL TUNNEL RELEASE ENDOSCOPIC;  Surgeon: Sebastian Lenis, MD;  Location[facility] OR;  Service: Orthopedics;  Laterality: Right;   CATARACT EXTRACTION W/ INTRAOCULAR LENS  IMPLANT, BILATERAL Bilateral    COLONIC STENT PLACEMENT N/A 08/31/2017   Procedure: COLONIC STENT PLACEMENT;  Surgeon: Rollin Dover, MD;  Location: [facility] ENDOSCOPY;  Service: Endoscopy;  Laterality: N/A;   COLONOSCOPY     2010-2011 Dr.Kipreos    COLOSTOMY  12/2016   thelbert 01/26/2017   COLOSTOMY REVERSAL N/A 07/13/2017   Procedure: COLOSTOMY REVERSAL;  Surgeon: Kimble Agent, MD;  [facility];  Service: General;  Laterality: N/A;   CYST REMOVAL HAND Right 05/2013   thumb   DILATION AND CURETTAGE OF UTERUS  X 5-6   ESOPHAGOGASTRODUODENOSCOPY (EGD) WITH PROPOFOL  N/A 03/13/2020   Procedure: ESOPHAGOGASTRODUODENOSCOPY (EGD) WITH PROPOFOL ;  Surgeon:  Rollin Dover, MD;  Location: [facility] ENDOSCOPY;  Service: Endoscopy;  Laterality: N/A;   EXCISION MASS ABDOMINAL N/A 07/09/2018   Procedure: EXPLORATION OF  ABDOMINAL WOUND ERAS PATHWAY;  Surgeon: Kimble Agent, MD;  Location: [facility] OR;  Service: General;  Laterality: N/A;   FLEXIBLE SIGMOIDOSCOPY N/A 08/24/2017   Procedure: FLEXIBLE SIGMOIDOSCOPY;  Surgeon:  Rollin Dover, MD;  Location: [facility] ENDOSCOPY;  Service: Endoscopy;  Laterality: N/A;   FLEXIBLE SIGMOIDOSCOPY N/A 08/31/2017   Procedure: FLEXIBLE SIGMOIDOSCOPY;  Surgeon: Rollin Dover, MD;  Location: [facility] ENDOSCOPY;  Service: Endoscopy;  Laterality: N/A;  stent placement   FLEXIBLE SIGMOIDOSCOPY N/A 03/13/2020   Procedure: FLEXIBLE SIGMOIDOSCOPY;  Surgeon: Rollin Dover, MD;  [facility] ENDOSCOPY;  Service: Endoscopy;  Laterality: N/A;   FLEXIBLE SIGMOIDOSCOPY N/A 12/18/2020   Procedure: FLEXIBLE SIGMOIDOSCOPY;  Surgeon: Rollin Dover, MD;  [facility] ENDOSCOPY;  Service: Endoscopy;  Laterality: N/A;   HEMOSTASIS CLIP PLACEMENT  03/13/2020   Procedure: HEMOSTASIS CLIP PLACEMENT;  Surgeon: Rollin Dover, MD;  [facility] ENDOSCOPY;  Service: Endoscopy;;   HEMOSTASIS CLIP PLACEMENT  12/18/2020   Procedure: HEMOSTASIS CLIP PLACEMENT;  Surgeon: Rollin Dover, MD;  [facility] ENDOSCOPY;  Service: Endoscopy;;   HOT HEMOSTASIS N/A 03/13/2020   Procedure: HOT HEMOSTASIS (ARGON PLASMA COAGULATION/BICAP);  Surgeon: Rollin Dover, MD;  Location: [facility];  Service: Endoscopy;  Laterality: N/A;   HOT HEMOSTASIS N/A 12/18/2020   Procedure: HOT HEMOSTASIS (ARGON PLASMA COAGULATION/BICAP);  Surgeon: Rollin Dover, MD;  Location: [facility];  Service: Endoscopy;  Laterality: N/A;   IMPLANTABLE CARDIOVERTER DEFIBRILLATOR GENERATOR CHANGE  2008   INSERTION OF MESH N/A 07/09/2018   Procedure: INSERTION OF VICRYL MESH;  Surgeon: Kimble Agent, MD;  [facility];  Service: General;  Laterality: N/A;   LAPAROTOMY N/A 09/18/2017   Procedure: EXPLORATORY LAPAROTOMY;   Surgeon: Kimble Agent, MD;  Location: [facility] OR;  Service: General;  Laterality: N/A;   LEAD REVISION  06/22/2018   LEAD REVISION/REPAIR N/A 06/22/2018   Procedure: LEAD REVISION/REPAIR;  Surgeon: Fernande Elspeth BROCKS, MD;  Location: [facility] INVASIVE CV LAB;  Service: Cardiovascular;  Laterality: N/A;   LYSIS OF ADHESION N/A 09/18/2017   Procedure: LYSIS OF ADHESION;  Surgeon: Kimble Agent, MD;  Location: [facility] OR;  Service: General;  Laterality: N/A;   LYSIS OF ADHESION N/A 07/09/2018   Procedure: LYSIS OF ADHESION;  Surgeon: Kimble Agent, MD;  Location: [facility] OR;  Service: General;  Laterality: N/A;   PARTIAL COLECTOMY N/A 09/18/2017   Procedure: ILEOCOLECTOMY;  Surgeon: Kimble Agent, MD;  Location: [facility] OR;  Service: General;  Laterality: N/A;   PARTIAL COLECTOMY  09/25/2017   POLYPECTOMY  03/13/2020   Procedure: POLYPECTOMY;  Surgeon: Rollin Dover, MD;  [facility] ENDOS[facility];  Service: Endoscopy;;   RIGHT/LEFT HEART CATH AND CORONARY ANGIOGRAPHY N/A 06/01/2018   Procedure: RIGHT/LEFT HEART CATH AND CORONARY ANGIOGRAPHY;  Surgeon: Dann Candyce RAMAN, MD;  Location: [facility] INVASIVE CV LAB;  Service: Cardiovascular;  Laterality: N/A;   SIGMOIDOSCOPY N/A 09/18/2017   Procedure: SIGMOIDOSCOPY;  Surgeon: Kimble Agent, MD;  [facility];  Service: General;  Laterality: N/A;   SMALL INTESTINE SURGERY  07/09/2018   EXPLORATION OF  ABDOMINAL WOUND ERAS PATHWAYN/; MESH; LYSIS OF ADHESIONS   TOTAL ABDOMINAL HYSTERECTOMY  1990   TRANSFORAMINAL LUMBAR INTERBODY FUSION (TLIF) WITH PEDICLE SCREW FIXATION 2 LEVEL N/A 01/07/2020   Procedure: Lumbar Four-Five and Lumbar Five-Sacral One open lumbar decompression and transforaminal lumbar interbody fusion;  Surgeon: Cheryle Debby LABOR, MD;  Location: [facility] OR;  Service: Neurosurgery;  Laterality: N/A;  Lumbar Four-Five and Lumbar Five-Sacral One open lumbar decompression and transforaminal lumbar interbody fusion   TRIGGER FINGER RELEASE Right 05/213   TRIGGER FINGER RELEASE Right  04/08/2019   Procedure: RIGHT INDEX RELEASE TRIGGER FINGER/A-1 PULLEY;  Surgeon: Sebastian Lenis, MD;  Location: [facility] OR;  Service: Orthopedics;  Laterality: Right;  TUBAL LIGATION  1980s   VESICOVAGINAL FISTULA CLOSURE W/ TAH     Past Surgical History:  Procedure Laterality Date   APPENDECTOMY  07/13/2017   BLADDER SUSPENSION  1990   w/hysterectomy   BOWEL RESECTION N/A 07/09/2018   Procedure: SMALL BOWEL RESECTION;  Surgeon: Kimble Agent, MD;  [facility];  Service: General;  Laterality: N/A;   CARDIAC CATHETERIZATION  2005   no obstructive CAD per patient   CARDIAC CATHETERIZATION  2018   CARDIAC DEFIBRILLATOR PLACEMENT  2005   BiV ICD implanted,  LV lead is an epicardial lead   CARPAL TUNNEL RELEASE Left 07/25/2014   Dr.Williamson    CARPAL TUNNEL RELEASE Right 04/08/2019   Procedure: RIGHT CARPAL TUNNEL RELEASE ENDOSCOPIC;  Surgeon: Sebastian Lenis, MD;  Location: [facility] OR;  Service: Orthopedics;  Laterality: Right;   CATARACT EXTRACTION W/ INTRAOCULAR LENS  IMPLANT, BILATERAL Bilateral    COLONIC STENT PLACEMENT N/A 08/31/2017   Procedure: COLONIC STENT PLACEMENT;  Surgeon: Rollin Dover, MD;  Location: [facility] ENDOSCOPY;  Service: Endoscopy;  Laterality: N/A;   COLONOSCOPY     2010-2011 Dr.Kipreos    COLOSTOMY  12/2016   thelbert 01/26/2017   COLOSTOMY REVERSAL N/A 07/13/2017   Procedure: COLOSTOMY REVERSAL;  Surgeon: Kimble Agent, MD;  [facility];  Service: General;  Laterality: N/A;   CYST REMOVAL HAND Right 05/2013   thumb   DILATION AND CURETTAGE OF UTERUS  X 5-6   ESOPHAGOGASTRODUODENOSCOPY (EGD) WITH PROPOFOL  N/A 03/13/2020   Procedure: ESOPHAGOGASTRODUODENOSCOPY (EGD) WITH PROPOFOL ;  Surgeon: Rollin Dover, MD;  [facility] ENDOSCOPY;  Service: Endoscopy;  Laterality: N/A;   EXCISION MASS ABDOMINAL N/A 07/09/2018   Procedure: EXPLORATION OF  ABDOMINAL WOUND ERAS PATHWAY;  Surgeon: Kimble Agent, MD;  Location: [facility] OR;  Service: General;  Laterality: N/A;   FLEXIBLE  SIGMOIDOSCOPY N/A 08/24/2017   Procedure: ENID MORIN;  Surgeon: Rollin Dover, MD;  Location: [facility] ENDOSCOPY;  Service: Endoscopy;  Laterality: N/A;   FLEXIBLE SIGMOIDOSCOPY N/A 08/31/2017   Procedure: FLEXIBLE SIGMOIDOSCOPY;  Surgeon: Rollin Dover, MD;  Location: [facility] ENDOSCOPY;  Service: Endoscopy;  Laterality: N/A;  stent placement   FLEXIBLE SIGMOIDOSCOPY N/A 03/13/2020   Procedure: FLEXIBLE SIGMOIDOSCOPY;  Surgeon: Rollin Dover, MD;  [facility] ENDOSCOPY;  Service: Endoscopy;  Laterality: N/A;   FLEXIBLE SIGMOIDOSCOPY N/A 12/18/2020   Procedure: FLEXIBLE SIGMOIDOSCOPY;  Surgeon: Rollin Dover, MD;  [facility] ENDOSCOPY;  Service: Endoscopy;  Laterality: N/A;   HEMOSTASIS CLIP PLACEMENT  03/13/2020   Procedure: HEMOSTASIS CLIP PLACEMENT;  Surgeon: Rollin Dover, MD;  [facility] ENDOSCOPY;  Service: Endoscopy;;   HEMOSTASIS CLIP PLACEMENT  12/18/2020   Procedure: HEMOSTASIS CLIP PLACEMENT;  Surgeon: Rollin Dover, MD;  [facility]OSCOPY;  Service: Endoscopy;;   HOT HEMOSTASIS N/A 03/13/2020   Procedure: HOT HEMOSTASIS (ARGON PLASMA COAGULATION/BICAP);  Surgeon: Rollin Dover, MD;  Location: [facility];  Service: Endoscopy;  Laterality: N/A;   HOT HEMOSTASIS N/A 12/18/2020   Procedure: HOT HEMOSTASIS (ARGON PLASMA COAGULATION/BICAP);  Surgeon: Rollin Dover, MD;  Location: [facility];  Service: Endoscopy;  Laterality: N/A;   IMPLANTABLE CARDIOVERTER DEFIBRILLATOR GENERATOR CHANGE  2008   INSERTION OF MESH N/A 07/09/2018   Procedure: INSERTION OF VICRYL MESH;  Surgeon: Kimble Agent, MD;  Location: [facility] OR;  Service: General;  Laterality: N/A;   LAPAROTOMY N/A 09/18/2017   Procedure: EXPLORATORY LAPAROTOMY;  Surgeon: Kimble Agent, MD;  Location: [facility] OR;  Service: General;  Laterality: N/A;   LEAD REVISION  06/22/2018   LEAD REVISION/REPAIR N/A 06/22/2018  Procedure: LEAD REVISION/REPAIR;  Surgeon: Fernande Elspeth BROCKS, MD;  Location: [facility] INVASIVE CV LAB;  Service: Cardiovascular;   Laterality: N/A;   LYSIS OF ADHESION N/A 09/18/2017   Procedure: LYSIS OF ADHESION;  Surgeon: Kimble Agent, MD;  Location: [facility] OR;  Service: General;  Laterality: N/A;   LYSIS OF ADHESION N/A 07/09/2018   Procedure: LYSIS OF ADHESION;  Surgeon: Kimble Agent, MD;  Location: [facility] OR;  Service: General;  Laterality: N/A;   PARTIAL COLECTOMY N/A 09/18/2017   Procedure: ILEOCOLECTOMY;  Surgeon: Kimble Agent, MD;  Location: [facility] OR;  Service: General;  Laterality: N/A;   PARTIAL COLECTOMY  09/25/2017   POLYPECTOMY  03/13/2020   Procedure: POLYPECTOMY;  Surgeon: Rollin Dover, MD;  Location[facility] ENDOSCOPY;  Service: Endoscopy;;   RIGHT/LEFT HEART CATH AND CORONARY ANGIOGRAPHY N/A 06/01/2018   Procedure: RIGHT/LEFT HEART CATH AND CORONARY ANGIOGRAPHY;  Surgeon: Dann Candyce RAMAN, MD;  Location: [facility] INVASIVE CV LAB;  Service: Cardiovascular;  Laterality: N/A;   SIGMOIDOSCOPY N/A 09/18/2017   Procedure: SIGMOIDOSCOPY;  Surgeon: Kimble Agent, MD;  [facility];  Service: General;  Laterality: N/A;   SMALL INTESTINE SURGERY  07/09/2018   EXPLORATION OF  ABDOMINAL WOUND ERAS PATHWAYN/; MESH; LYSIS OF ADHESIONS   TOTAL ABDOMINAL HYSTERECTOMY  1990   TRANSFORAMINAL LUMBAR INTERBODY FUSION (TLIF) WITH PEDICLE SCREW FIXATION 2 LEVEL N/A 01/07/2020   Procedure: Lumbar Four-Five and Lumbar Five-Sacral One open lumbar decompression and transforaminal lumbar interbody fusion;  Surgeon: Cheryle Debby LABOR, MD;  Location: [facility] OR;  Service: Neurosurgery;  Laterality: N/A;  Lumbar Four-Five and Lumbar Five-Sacral One open lumbar decompression and transforaminal lumbar interbody fusion   TRIGGER FINGER RELEASE Right 05/213   TRIGGER FINGER RELEASE Right 04/08/2019   Procedure: RIGHT INDEX RELEASE TRIGGER FINGER/A-1 PULLEY;  Surgeon: Sebastian Lenis, MD;  Location: [facility] OR;  Service: Orthopedics;  Laterality: Right;   TUBAL LIGATION  1980s   VESICOVAGINAL FISTULA CLOSURE W/ TAH     Past Medical History:  Diagnosis Date    AICD (automatic cardioverter/defibrillator) present    high RV threshold chronically, device was turned off in 2014; Device battery has been dead x 7 years; turned it back on 06/22/2018   Anemia, iron  deficiency    Angioedema    felt to likely be due to ace inhibitors but says she has had this even off of medicines, appears to be tolerating ARBs chronically   Anxiety    Arthritis    hands, legs, arms; bad in my back (06/22/2018)   Asthmatic bronchitis with status asthmaticus    Bradycardia    CHF (congestive heart failure) (HCC)    Chronic lower back pain    Chronic pain    CKD (chronic kidney disease), stage III (HCC)    Coronary artery disease    Mild nonobstructive (30% LAD, 30% RCA) by 09/01/16 cath at [facility]   Degenerative joint disease    Depression    Diabetes mellitus, type 2 (HCC)    Diabetic peripheral neuropathy (HCC)    Dizziness    Dyspnea on exertion    Family history of adverse reaction to anesthesia    Mother has nausea   GERD (gastroesophageal reflux disease)    Gout    Heart valve disorder    History of blood transfusion 1981; ~ 2005; 12/2016   childbirth; defibrillator OR; colostomy OR   History of mononucleosis 03/2014   Hyperlipidemia    Hypertension    Hypothyroid    Insomnia    Intervertebral  disc degeneration    Ischemic cardiomyopathy    LBBB (left bundle branch block)    Myocardial infarction [facility]) dx'd ~ 2005   Nonischemic cardiomyopathy (HCC)    s/p ICD in 2005. EF has since recovered   Obesity    On home oxygen  therapy    2L at night (06/22/2018)   OSA on CPAP    Pneumonia    couple times; last time was 12/2016 (06/22/2018)   PONV (postoperative nausea and vomiting)    Presence of permanent cardiac pacemaker    [employer]   Swelling    Syncope    Systemic hypertension    Vitamin D  deficiency    BP 105/62 (BP Location: Left Arm, Patient Position: Sitting, Cuff Size: Large)   Pulse 77   Ht 5' 4 (1.626 m)    Wt 246 lb 6.4 oz (111.8 kg)   SpO2 91%   BMI 42.29 kg/m   Opioid Risk Score:   Fall Risk Score:  `1  Depression screen [facility] 2/9     03/28/2024    2:01 PM 02/08/2024   11:56 AM 01/08/2024   12:46 PM 12/11/2023    1:47 PM 11/06/2023   10:53 AM 09/11/2023   10:15 AM 08/10/2023   10:54 AM  Depression screen PHQ 2/9  Decreased Interest 0 0 1 0 0 0 0  Down, Depressed, Hopeless 0 0 1 0 0 0 0  PHQ - 2 Score 0 0 2 0 0 0 0      Review of Systems  Musculoskeletal:  Positive for back pain and neck pain.       Neck pain, low back pain, left hip pain radiating down thigh, bilateral knee pain, left wrist pain  All other systems reviewed and are negative.      Objective:   Physical Exam Vitals and nursing note reviewed.  Constitutional:      Appearance: Normal appearance. She is obese.  Cardiovascular:     Rate and Rhythm: Normal rate and regular rhythm.     Pulses: Normal pulses.     Heart sounds: Normal heart sounds.  Pulmonary:     Effort: Pulmonary effort is normal.     Breath sounds: Normal breath sounds.  Musculoskeletal:     Comments: Normal Muscle Bulk and Muscle Testing Reveals:  Upper Extremities: Full ROM and Muscle Strength 5/5 Lumbar Paraspinal Tenderness L-4-L-5 Left Greater Trochanter Tenderness Lower Extremities: Full ROM and Muscle Strength 5/5 Arises from chair slowly using walker for support Antalgic  Gait     Skin:    General: Skin is warm and dry.  Neurological:     General: No focal deficit present.     Mental Status: She is alert and oriented to person, place, and time.          Assessment & Plan:  Acute Exacerbation of Chronic Low Back Pain: RX: Lumbar X-ray: She refuses Medrol  dose pak, due to her DM, she reports.  Chronic Low Back Pain: Encouraged to increase HEP as Tolerated. Continue to Monitor. 112/22/2025 2. Bilateral Primary Osteoarthritis:  S/P Right Knee Genicular nerve radiofrequency neurotomy, with Dr Carilyn. On 08/02/2022 and S/P Left  Knee Genicular Nerve Radiofrequency on 03/28/2024. With good relief noted.  : Indication Chronic post operative pain in the Knee, pain postop total knee replacement which has not responded to conservative management such as physical therapy and medication management; Continue monitor. 07/15/2024 3. Myofascial Pain: Continue HEP as Tolerated. Continue to Monitor.  07/15/2024 4. Chronic Pain  Syndrome: Increased ;  Oxycodone  10mg  /325 mg one tablet every 6 hours as needed for pain. Oxycodone  10 mg discontinued.  Discontinue Hydrocodone  due to itching,  We will continue the opioid monitoring program, this consists of regular clinic visits, examinations, urine drug screen, pill counts as well as use of [place]  Controlled Substance Reporting system. A 12 month History has been reviewed on the [place]  Controlled Substance Reporting System on 07/15/2024. 5. Polyarthralgia: Continue HEP as tolerated. Continue current medication regimen. Continue to monitor. 07/15/2024 6. Chronic Bilateral Thoracic - Back Pain: no complaints today. Continue HEP as Tolerated. Continue to Monitor. 07/15/2024.  7. Fall at home: No falls this month. Educated on [employer], she verbalizes understanding. 07/15/2024   F/U in 1 month    "

## 2024-07-30 ENCOUNTER — Ambulatory Visit
Admission: RE | Admit: 2024-07-30 | Discharge: 2024-07-30 | Disposition: A | Source: Ambulatory Visit | Attending: Registered Nurse

## 2024-08-13 ENCOUNTER — Encounter: Payer: Self-pay | Admitting: Registered Nurse

## 2024-08-13 ENCOUNTER — Encounter: Attending: Registered Nurse | Admitting: Registered Nurse

## 2024-08-13 VITALS — BP 107/64 | HR 79 | Ht 64.0 in | Wt 241.8 lb

## 2024-08-13 DIAGNOSIS — M961 Postlaminectomy syndrome, not elsewhere classified: Secondary | ICD-10-CM | POA: Diagnosis not present

## 2024-08-13 DIAGNOSIS — G894 Chronic pain syndrome: Secondary | ICD-10-CM | POA: Diagnosis not present

## 2024-08-13 DIAGNOSIS — G8929 Other chronic pain: Secondary | ICD-10-CM | POA: Diagnosis present

## 2024-08-13 DIAGNOSIS — Z5181 Encounter for therapeutic drug level monitoring: Secondary | ICD-10-CM | POA: Insufficient documentation

## 2024-08-13 DIAGNOSIS — M170 Bilateral primary osteoarthritis of knee: Secondary | ICD-10-CM | POA: Diagnosis not present

## 2024-08-13 DIAGNOSIS — M542 Cervicalgia: Secondary | ICD-10-CM | POA: Insufficient documentation

## 2024-08-13 DIAGNOSIS — Z79891 Long term (current) use of opiate analgesic: Secondary | ICD-10-CM | POA: Insufficient documentation

## 2024-08-13 DIAGNOSIS — M545 Low back pain, unspecified: Secondary | ICD-10-CM | POA: Insufficient documentation

## 2024-08-13 MED ORDER — OXYCODONE-ACETAMINOPHEN 10-325 MG PO TABS: 1.0000 | ORAL_TABLET | Freq: Four times a day (QID) | ORAL | 0 refills | Status: AC | PRN

## 2024-08-13 NOTE — Progress Notes (Unsigned)
 "  Subjective:    Patient ID: Crystal [name], female    DOB: 10/26/1950, 74 y.o.   MRN: [id number]  HPI: Crystal [name] is a 74 y.o. female who returns for follow up appointment for chronic pain and medication refill. states *** pain is located in  ***. rates pain ***. current exercise regime is walking and performing stretching exercises.  Crystal [name] Morphine  equivalent is *** MME.   Last Oral Swab was Performed on 03/28/2024, it was consistent.    Pain Inventory Average Pain 10 Pain Right Now 6 My pain is sharp, burning, stabbing, and aching  In the last 24 hours, has pain interfered with the following? General activity 7 Relation with others 5 Enjoyment of life 5 What TIME of day is your pain at its worst? morning , daytime, evening, and night Sleep (in general) Good  Pain is worse with: walking, bending, standing, and some activites Pain improves with: rest and medication Relief from Meds: 8  Family History  Problem Relation Age of Onset   Arrhythmia Mother    Diabetes Mellitus II Mother        Borderline DM   Hypertension Mother    Asthma Mother    Hypertension Father    Heart disease Father    Prostate cancer Father    Hypertension Daughter    Hypertension Daughter    Diabetes Daughter    Hypertension Daughter    Atrial fibrillation Daughter    GER disease Daughter    Breast cancer Maternal Grandmother 36 - 71   Cancer Brother    Dementia Brother    Hypertension Brother    Hypertension Son    Anxiety disorder Son    Hypothyroidism Son    Heart disease Other        (Maternal side) Ischemic heart disease   Diabetes Mellitus II Other    Social History   Socioeconomic History   Marital status: Widowed    Spouse name: Not on file   Number of children: Not on file   Years of education: Not on file   Highest education level: Not on file  Occupational History    Comment: retired LPN  Tobacco Use   Smoking status: Never   Smokeless tobacco: Never  Vaping Use    Vaping status: Never Used  Substance and Sexual Activity   Alcohol  use: Not Currently   Drug use: Never   Sexual activity: Not Currently  Other Topics Concern   Not on file  Social History Narrative   [place].  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      As of 03/15/17:   Diet: 1800 Calorie      Caffeine: Yes      Married, if yes what year: Widowed, married 1973      Do you live in a house, apartment, assisted living, condo, trailer, ect: House, 1 stories, and 1 person      Pets: No      Current/Past profession: LPN, retired       Exercise: Yes, walking          Living Will: Yes   DNR: No   POA/HPOA: Yes      Functional Status:   Do you have difficulty bathing or dressing yourself? No   Do you have difficulty preparing food or eating? No   Do you have difficulty managing your medications? No   Do  you have difficulty managing your finances? No   Do you have difficulty affording your medications? Yes   Social Drivers of Health   Tobacco Use: Low Risk (08/13/2024)   Patient History    Smoking Tobacco Use: Never    Smokeless Tobacco Use: Never    Passive Exposure: Not on file  Financial Resource Strain: Not on file  Food Insecurity: No Food Insecurity (07/06/2023)   Hunger Vital Sign    Worried About Running Out of Food in the Last Year: Never true    Ran Out of Food in the Last Year: Never true  Transportation Needs: No Transportation Needs (07/06/2023)   PRAPARE - Administrator, Civil Service (Medical): No    Lack of Transportation (Non-Medical): No  Physical Activity: Not on file  Stress: Not on file  Social Connections: Not on file  Depression (PHQ2-9): Low Risk (03/28/2024)   Depression (PHQ2-9)    PHQ-2 Score: 0  Alcohol  Screen: Not on file  Housing: Unknown (07/06/2023)   Housing Stability Vital Sign    Unable to Pay for Housing in the Last Year: No    Number of Times  Moved in the Last Year: Not on file    Homeless in the Last Year: No  Utilities: Not At Risk (07/06/2023)   AHC Utilities    Threatened with loss of utilities: No  Health Literacy: Not on file   Past Surgical History:  Procedure Laterality Date   APPENDECTOMY  07/13/2017   BLADDER SUSPENSION  1990   w/hysterectomy   BOWEL RESECTION N/A 07/09/2018   Procedure: SMALL BOWEL RESECTION;  Surgeon: Kimble Agent, MD;  [facility];  Service: General;  Laterality: N/A;   CARDIAC CATHETERIZATION  2005   no obstructive CAD per patient   CARDIAC CATHETERIZATION  2018   CARDIAC DEFIBRILLATOR PLACEMENT  2005   BiV ICD implanted,  LV lead is an epicardial lead   CARPAL TUNNEL RELEASE Left 07/25/2014   Dr.Williamson    CARPAL TUNNEL RELEASE Right 04/08/2019   Procedure: RIGHT CARPAL TUNNEL RELEASE ENDOSCOPIC;  Surgeon: Sebastian Lenis, MD;  Location: [facility] OR;  Service: Orthopedics;  Laterality: Right;   CATARACT EXTRACTION W/ INTRAOCULAR LENS  IMPLANT, BILATERAL Bilateral    COLONIC STENT PLACEMENT N/A 08/31/2017   Procedure: COLONIC STENT PLACEMENT;  Surgeon: Rollin Dover, MD;  Location: [facility] ENDOSCOPY;  Service: Endoscopy;  Laterality: N/A;   COLONOSCOPY     2010-2011 Dr.Kipreos    COLOSTOMY  12/2016   Crystal [name] 01/26/2017   COLOSTOMY REVERSAL N/A 07/13/2017   Procedure: COLOSTOMY REVERSAL;  Surgeon: Kimble Agent, MD;  [facility];  Service: General;  Laterality: N/A;   CYST REMOVAL HAND Right 05/2013   thumb   DILATION AND CURETTAGE OF UTERUS  X 5-6   ESOPHAGOGASTRODUODENOSCOPY (EGD) WITH PROPOFOL  N/A 03/13/2020   Procedure: ESOPHAGOGASTRODUODENOSCOPY (EGD) WITH PROPOFOL ;  Surgeon: Rollin Dover, MD;  Location: [facility] ENDOSCOPY;  Service: Endoscopy;  Laterality: N/A;   EXCISION MASS ABDOMINAL N/A 07/09/2018   Procedure: EXPLORATION OF  ABDOMINAL WOUND ERAS PATHWAY;  Surgeon: Kimble Agent, MD;  Location: [facility] OR;  Service: General;  Laterality: N/A;   FLEXIBLE SIGMOIDOSCOPY N/A 08/24/2017    Procedure: ENID MORIN;  Surgeon: Rollin Dover, MD;  Location: [facility] ENDOSCOPY;  Service: Endoscopy;  Laterality: N/A;   FLEXIBLE SIGMOIDOSCOPY N/A 08/31/2017   Procedure: FLEXIBLE SIGMOIDOSCOPY;  Surgeon: Rollin Dover, MD;  Location: [facility] ENDOSCOPY;  Service: Endoscopy;  Laterality: N/A;  stent placement   FLEXIBLE SIGMOIDOSCOPY N/A 03/13/2020  Procedure: FLEXIBLE SIGMOIDOSCOPY;  Surgeon: Rollin Dover, MD;  Location: [facility];  Service: Endoscopy;  Laterality: N/A;   FLEXIBLE SIGMOIDOSCOPY N/A 12/18/2020   Procedure: FLEXIBLE SIGMOIDOSCOPY;  Surgeon: Rollin Dover, MD;  Location: [facility] ENDOSCOPY;  Service: Endoscopy;  Laterality: N/A;   HEMOSTASIS CLIP PLACEMENT  03/13/2020   Procedure: HEMOSTASIS CLIP PLACEMENT;  Surgeon: Rollin Dover, MD;  Location: [facility] ENDOSCOPY;  Service: Endoscopy;;   HEMOSTASIS CLIP PLACEMENT  12/18/2020   Procedure: HEMOSTASIS CLIP PLACEMENT;  Surgeon: Rollin Dover, MD;  Location[facility] ENDOSCOPY;  Service: Endoscopy;;   HOT HEMOSTASIS N/A 03/13/2020   Procedure: HOT HEMOSTASIS (ARGON PLASMA COAGULATION/BICAP);  Surgeon: Rollin Dover, MD;  Location: [facility]OSCOPY;  Service: Endoscopy;  Laterality: N/A;   HOT HEMOSTASIS N/A 12/18/2020   Procedure: HOT HEMOSTASIS (ARGON PLASMA COAGULATION/BICAP);  Surgeon: Rollin Dover, MD;  Location: [facility] ENDOSCOPY;  Service: Endoscopy;  Laterality: N/A;   IMPLANTABLE CARDIOVERTER DEFIBRILLATOR GENERATOR CHANGE  2008   INSERTION OF MESH N/A 07/09/2018   Procedure: INSERTION OF VICRYL MESH;  Surgeon: Kimble Agent, MD;  Location: [facility] OR;  Service: General;  Laterality: N/A;   LAPAROTOMY N/A 09/18/2017   Procedure: EXPLORATORY LAPAROTOMY;  Surgeon: Kimble Agent, MD;  Location: [facility] OR;  Service: General;  Laterality: N/A;   LEAD REVISION  06/22/2018   LEAD REVISION/REPAIR N/A 06/22/2018   Procedure: LEAD REVISION/REPAIR;  Surgeon: Fernande Elspeth BROCKS, MD;  Location: [facility] INVASIVE CV LAB;  Service: Cardiovascular;  Laterality: N/A;   LYSIS OF  ADHESION N/A 09/18/2017   Procedure: LYSIS OF ADHESION;  Surgeon: Kimble Agent, MD;  Location: [facility] OR;  Service: General;  Laterality: N/A;   LYSIS OF ADHESION N/A 07/09/2018   Procedure: LYSIS OF ADHESION;  Surgeon: Kimble Agent, MD;  Location: [facility] OR;  Service: General;  Laterality: N/A;   PARTIAL COLECTOMY N/A 09/18/2017   Procedure: ILEOCOLECTOMY;  Surgeon: Kimble Agent, MD;  Location: [facility] OR;  Service: General;  Laterality: N/A;   PARTIAL COLECTOMY  09/25/2017   POLYPECTOMY  03/13/2020   Procedure: POLYPECTOMY;  Surgeon: Rollin Dover, MD;  Location[facility] ENDOSCOPY;  Service: Endoscopy;;   RIGHT/LEFT HEART CATH AND CORONARY ANGIOGRAPHY N/A 06/01/2018   Procedure: RIGHT/LEFT HEART CATH AND CORONARY ANGIOGRAPHY;  Surgeon: Dann Candyce RAMAN, MD;  Location: [facility] INVASIVE CV LAB;  Service: Cardiovascular;  Laterality: N/A;   SIGMOIDOSCOPY N/A 09/18/2017   Procedure: SIGMOIDOSCOPY;  Surgeon: Kimble Agent, MD;  [facility] OR;  Service: General;  Laterality: N/A;   SMALL INTESTINE SURGERY  07/09/2018   EXPLORATION OF  ABDOMINAL WOUND ERAS PATHWAYN/; MESH; LYSIS OF ADHESIONS   TOTAL ABDOMINAL HYSTERECTOMY  1990   TRANSFORAMINAL LUMBAR INTERBODY FUSION (TLIF) WITH PEDICLE SCREW FIXATION 2 LEVEL N/A 01/07/2020   Procedure: Lumbar Four-Five and Lumbar Five-Sacral One open lumbar decompression and transforaminal lumbar interbody fusion;  Surgeon: Cheryle Debby LABOR, MD;  Location: [facility] OR;  Service: Neurosurgery;  Laterality: N/A;  Lumbar Four-Five and Lumbar Five-Sacral One open lumbar decompression and transforaminal lumbar interbody fusion   TRIGGER FINGER RELEASE Right 05/213   TRIGGER FINGER RELEASE Right 04/08/2019   Procedure: RIGHT INDEX RELEASE TRIGGER FINGER/A-1 PULLEY;  Surgeon: Sebastian Lenis, MD;  Location: [facility] OR;  Service: Orthopedics;  Laterality: Right;   TUBAL LIGATION  1980s   VESICOVAGINAL FISTULA CLOSURE W/ TAH     Past Surgical History:  Procedure Laterality Date   APPENDECTOMY   07/13/2017   BLADDER SUSPENSION  1990   w/hysterectomy   BOWEL RESECTION N/A 07/09/2018   Procedure: SMALL BOWEL RESECTION;  Surgeon: Kimble Agent, MD;  Location: [facility] OR;  Service: General;  Laterality: N/A;   CARDIAC CATHETERIZATION  2005   no obstructive CAD per patient   CARDIAC CATHETERIZATION  2018   CARDIAC DEFIBRILLATOR PLACEMENT  2005   BiV ICD implanted,  LV lead is an epicardial lead   CARPAL TUNNEL RELEASE Left 07/25/2014   Dr.Williamson    CARPAL TUNNEL RELEASE Right 04/08/2019   Procedure: RIGHT CARPAL TUNNEL RELEASE ENDOSCOPIC;  Surgeon: Sebastian Lenis, MD;  Location: [facility] OR;  Service: Orthopedics;  Laterality: Right;   CATARACT EXTRACTION W/ INTRAOCULAR LENS  IMPLANT, BILATERAL Bilateral    COLONIC STENT PLACEMENT N/A 08/31/2017   Procedure: COLONIC STENT PLACEMENT;  Surgeon: Rollin Dover, MD;  Location: [facility] ENDOSCOPY;  Service: Endoscopy;  Laterality: N/A;   COLONOSCOPY     2010-2011 Dr.Kipreos    COLOSTOMY  12/2016   Crystal [name] 01/26/2017   COLOSTOMY REVERSAL N/A 07/13/2017   Procedure: COLOSTOMY REVERSAL;  Surgeon: Kimble Agent, MD;  Location[facility] OR;  Service: General;  Laterality: N/A;   CYST REMOVAL HAND Right 05/2013   thumb   DILATION AND CURETTAGE OF UTERUS  X 5-6   ESOPHAGOGASTRODUODENOSCOPY (EGD) WITH PROPOFOL  N/A 03/13/2020   Procedure: ESOPHAGOGASTRODUODENOSCOPY (EGD) WITH PROPOFOL ;  Surgeon: Rollin Dover, MD;  Location[facility] ENDOSCOPY;  Service: Endoscopy;  Laterality: N/A;   EXCISION MASS ABDOMINAL N/A 07/09/2018   Procedure: EXPLORATION OF  ABDOMINAL WOUND ERAS PATHWAY;  Surgeon: Kimble Agent, MD;  Location: [facility] OR;  Service: General;  Laterality: N/A;   FLEXIBLE SIGMOIDOSCOPY N/A 08/24/2017   Procedure: ENID MORIN;  Surgeon: Rollin Dover, MD;  Location: [facility] ENDOSCOPY;  Service: Endoscopy;  Laterality: N/A;   FLEXIBLE SIGMOIDOSCOPY N/A 08/31/2017   Procedure: FLEXIBLE SIGMOIDOSCOPY;  Surgeon: Rollin Dover, MD;  Location: [facility] ENDOSCOPY;  Service: Endoscopy;   Laterality: N/A;  stent placement   FLEXIBLE SIGMOIDOSCOPY N/A 03/13/2020   Procedure: FLEXIBLE SIGMOIDOSCOPY;  Surgeon: Rollin Dover, MD;  Location: [facility] ENDOSCOPY;  Service: Endoscopy;  Laterality: N/A;   FLEXIBLE SIGMOIDOSCOPY N/A 12/18/2020   Procedure: FLEXIBLE SIGMOIDOSCOPY;  Surgeon: Rollin Dover, MD;  Location: [facility] ENDOSCOPY;  Service: Endoscopy;  Laterality: N/A;   HEMOSTASIS CLIP PLACEMENT  03/13/2020   Procedure: HEMOSTASIS CLIP PLACEMENT;  Surgeon: Rollin Dover, MD;  Location: [facility] ENDOSCOPY;  Service: Endoscopy;;   HEMOSTASIS CLIP PLACEMENT  12/18/2020   Procedure: HEMOSTASIS CLIP PLACEMENT;  Surgeon: Rollin Dover, MD;  Location: [facility] ENDOSCOPY;  Service: Endoscopy;;   HOT HEMOSTASIS N/A 03/13/2020   Procedure: HOT HEMOSTASIS (ARGON PLASMA COAGULATION/BICAP);  Surgeon: Rollin Dover, MD;  Location: [facility];  Service: Endoscopy;  Laterality: N/A;   HOT HEMOSTASIS N/A 12/18/2020   Procedure: HOT HEMOSTASIS (ARGON PLASMA COAGULATION/BICAP);  Surgeon: Rollin Dover, MD;  Location: [facility];  Service: Endoscopy;  Laterality: N/A;   IMPLANTABLE CARDIOVERTER DEFIBRILLATOR GENERATOR CHANGE  2008   INSERTION OF MESH N/A 07/09/2018   Procedure: INSERTION OF VICRYL MESH;  Surgeon: Kimble Agent, MD;  Location: [facility] OR;  Service: General;  Laterality: N/A;   LAPAROTOMY N/A 09/18/2017   Procedure: EXPLORATORY LAPAROTOMY;  Surgeon: Kimble Agent, MD;  Location: [facility] OR;  Service: General;  Laterality: N/A;   LEAD REVISION  06/22/2018   LEAD REVISION/REPAIR N/A 06/22/2018   Procedure: LEAD REVISION/REPAIR;  Surgeon: Fernande Elspeth BROCKS, MD;  Location: [facility] INVASIVE CV LAB;  Service: Cardiovascular;  Laterality: N/A;   LYSIS OF ADHESION N/A 09/18/2017   Procedure: LYSIS OF ADHESION;  Surgeon: Kimble Agent, MD;  Location: [facility] OR;  Service: General;  Laterality: N/A;   LYSIS OF ADHESION N/A 07/09/2018   Procedure: LYSIS OF ADHESION;  Surgeon: Kimble Agent, MD;  Location: [facility] OR;  Service: General;  Laterality:  N/A;   PARTIAL COLECTOMY N/A 09/18/2017   Procedure: ILEOCOLECTOMY;  Surgeon: Kimble Agent, MD;  Location: [facility] OR;  Service: General;  Laterality: N/A;   PARTIAL COLECTOMY  09/25/2017   POLYPECTOMY  03/13/2020   Procedure: POLYPECTOMY;  Surgeon: Rollin Dover, MD;  Location: [facility] ENDOSCOPY;  Service: Endoscopy;;   RIGHT/LEFT HEART CATH AND CORONARY ANGIOGRAPHY N/A 06/01/2018   Procedure: RIGHT/LEFT HEART CATH AND CORONARY ANGIOGRAPHY;  Surgeon: Dann Candyce RAMAN, MD;  Location: [facility] INVASIVE CV LAB;  Service: Cardiovascular;  Laterality: N/A;   SIGMOIDOSCOPY N/A 09/18/2017   Procedure: SIGMOIDOSCOPY;  Surgeon: Kimble Agent, MD;  Location[facility] OR;  Service: General;  Laterality: N/A;   SMALL INTESTINE SURGERY  07/09/2018   EXPLORATION OF  ABDOMINAL WOUND ERAS PATHWAYN/; MESH; LYSIS OF ADHESIONS   TOTAL ABDOMINAL HYSTERECTOMY  1990   TRANSFORAMINAL LUMBAR INTERBODY FUSION (TLIF) WITH PEDICLE SCREW FIXATION 2 LEVEL N/A 01/07/2020   Procedure: Lumbar Four-Five and Lumbar Five-Sacral One open lumbar decompression and transforaminal lumbar interbody fusion;  Surgeon: Cheryle Debby LABOR, MD;  Location: [facility] OR;  Service: Neurosurgery;  Laterality: N/A;  Lumbar Four-Five and Lumbar Five-Sacral One open lumbar decompression and transforaminal lumbar interbody fusion   TRIGGER FINGER RELEASE Right 05/213   TRIGGER FINGER RELEASE Right 04/08/2019   Procedure: RIGHT INDEX RELEASE TRIGGER FINGER/A-1 PULLEY;  Surgeon: Sebastian Lenis, MD;  Location: [facility] OR;  Service: Orthopedics;  Laterality: Right;   TUBAL LIGATION  1980s   VESICOVAGINAL FISTULA CLOSURE W/ TAH     Past Medical History:  Diagnosis Date   AICD (automatic cardioverter/defibrillator) present    high RV threshold chronically, device was turned off in 2014; Device battery has been dead x 7 years; turned it back on 06/22/2018   Anemia, iron  deficiency    Angioedema    felt to likely be due to ace inhibitors but says she has had this even off of  medicines, appears to be tolerating ARBs chronically   Anxiety    Arthritis    hands, legs, arms; bad in my back (06/22/2018)   Asthmatic bronchitis with status asthmaticus    Bradycardia    CHF (congestive heart failure) (HCC)    Chronic lower back pain    Chronic pain    CKD (chronic kidney disease), stage III (HCC)    Coronary artery disease    Mild nonobstructive (30% LAD, 30% RCA) by 09/01/16 cath at [facility]   Degenerative joint disease    Depression    Diabetes mellitus, type 2 (HCC)    Diabetic peripheral neuropathy (HCC)    Dizziness    Dyspnea on exertion    Family history of adverse reaction to anesthesia    Mother has nausea   GERD (gastroesophageal reflux disease)    Gout    Heart valve disorder    History of blood transfusion 1981; ~ 2005; 12/2016   childbirth; defibrillator OR; colostomy OR   History of mononucleosis 03/2014   Hyperlipidemia    Hypertension    Hypothyroid    Insomnia    Intervertebral disc degeneration    Ischemic cardiomyopathy    LBBB (left bundle branch block)    Myocardial infarction (HCC) dx'd ~ 2005   Nonischemic cardiomyopathy (HCC)    s/p ICD in 2005. EF has since recovered   Obesity    On  home oxygen  therapy    2L at night (06/22/2018)   OSA on CPAP    Pneumonia    couple times; last time was 12/2016 (06/22/2018)   PONV (postoperative nausea and vomiting)    Presence of permanent cardiac pacemaker    [employer]   Swelling    Syncope    Systemic hypertension    Vitamin D  deficiency    BP 107/64 (BP Location: Left Arm, Patient Position: Sitting, Cuff Size: Large)   Pulse 79   Ht 5' 4 (1.626 m)   Wt 241 lb 12.8 oz (109.7 kg)   SpO2 91%   BMI 41.50 kg/m   Opioid Risk Score:   Fall Risk Score:  `1  Depression screen [facility] 2/9     03/28/2024    2:01 PM 02/08/2024   11:56 AM 01/08/2024   12:46 PM 12/11/2023    1:47 PM 11/06/2023   10:53 AM 09/11/2023   10:15 AM 08/10/2023   10:54 AM  Depression  screen PHQ 2/9  Decreased Interest 0 0 1 0 0 0 0  Down, Depressed, Hopeless 0 0 1 0 0 0 0  PHQ - 2 Score 0 0 2 0 0 0 0      Review of Systems  Musculoskeletal:  Positive for back pain and neck pain.       Neck, back and bilateral knee pain  All other systems reviewed and are negative.      Objective:   Physical Exam        Assessment & Plan:  Chronic Low Back Pain: Encouraged to increase HEP as Tolerated. Continue to Monitor. 06/13/2024 2. Bilateral Primary Osteoarthritis:  S/P Right Knee Genicular nerve radiofrequency neurotomy, with Dr Carilyn. On 08/02/2022 and S/P Left Knee Genicular Nerve Radiofrequency on 03/28/2024. With good relief noted.  : Indication Chronic post operative pain in the Knee, pain postop total knee replacement which has not responded to conservative management such as physical therapy and medication management; Continue monitor. 06/13/2024 3. Myofascial Pain: Continue HEP as Tolerated. Continue to Monitor.  06/13/2024 4. Chronic Pain Syndrome:Refilled;  Oxycodone  7.5mg  /325 mg one tablet every 6 hours as needed for pain. Oxycodone  10 mg discontinued.  Discontinue Hydrocodone  due to itching,  We will continue the opioid monitoring program, this consists of regular clinic visits, examinations, urine drug screen, pill counts as well as use of [place]  Controlled Substance Reporting system. A 12 month History has been reviewed on the [place]  Controlled Substance Reporting System on 06/13/2024. 5. Polyarthralgia: Continue HEP as tolerated. Continue current medication regimen. Continue to monitor. 06/13/2024 6. Chronic Bilateral Thoracic - Back Pain: no complaints today. Continue HEP as Tolerated. Continue to Monitor. 06/13/2024.  7. Fall at home: Educated on [employer], she verbalizes understanding.    F/U in 1 month    "

## 2024-09-12 ENCOUNTER — Encounter: Attending: Registered Nurse | Admitting: Registered Nurse

## 2024-09-19 ENCOUNTER — Ambulatory Visit: Admitting: Cardiology

## 2024-09-23 ENCOUNTER — Encounter

## 2024-10-10 ENCOUNTER — Encounter: Admitting: Registered Nurse

## 2024-12-23 ENCOUNTER — Encounter

## 2025-03-24 ENCOUNTER — Encounter

## 2025-06-23 ENCOUNTER — Encounter

## 2025-09-22 ENCOUNTER — Encounter
